# Patient Record
Sex: Male | Born: 1950
Health system: Southern US, Community
[De-identification: ages and names within clinical notes are randomized; demographics above are authoritative.]

## PROBLEM LIST (undated history)

## (undated) DIAGNOSIS — H401134 Primary open-angle glaucoma, bilateral, indeterminate stage: Secondary | ICD-10-CM

## (undated) DIAGNOSIS — I69991 Dysphagia following unspecified cerebrovascular disease: Secondary | ICD-10-CM

## (undated) DIAGNOSIS — K4090 Unilateral inguinal hernia, without obstruction or gangrene, not specified as recurrent: Secondary | ICD-10-CM

## (undated) DIAGNOSIS — R001 Bradycardia, unspecified: Secondary | ICD-10-CM

## (undated) DIAGNOSIS — N189 Chronic kidney disease, unspecified: Secondary | ICD-10-CM

## (undated) DIAGNOSIS — Z9181 History of falling: Secondary | ICD-10-CM

## (undated) DIAGNOSIS — N2889 Other specified disorders of kidney and ureter: Secondary | ICD-10-CM

## (undated) DIAGNOSIS — N183 Chronic kidney disease, stage 3 (moderate): Secondary | ICD-10-CM

## (undated) DIAGNOSIS — J45909 Unspecified asthma, uncomplicated: Secondary | ICD-10-CM

## (undated) DIAGNOSIS — Z950 Presence of cardiac pacemaker: Secondary | ICD-10-CM

## (undated) DIAGNOSIS — I6930 Unspecified sequelae of cerebral infarction: Secondary | ICD-10-CM

## (undated) DIAGNOSIS — E7800 Pure hypercholesterolemia, unspecified: Secondary | ICD-10-CM

## (undated) DIAGNOSIS — G709 Myoneural disorder, unspecified: Secondary | ICD-10-CM

## (undated) DIAGNOSIS — K219 Gastro-esophageal reflux disease without esophagitis: Secondary | ICD-10-CM

## (undated) DIAGNOSIS — G1221 Amyotrophic lateral sclerosis: Secondary | ICD-10-CM

## (undated) DIAGNOSIS — I10 Essential (primary) hypertension: Secondary | ICD-10-CM

## (undated) DIAGNOSIS — H2513 Age-related nuclear cataract, bilateral: Secondary | ICD-10-CM

## (undated) DIAGNOSIS — I639 Cerebral infarction, unspecified: Secondary | ICD-10-CM

## (undated) DIAGNOSIS — D1803 Hemangioma of intra-abdominal structures: Secondary | ICD-10-CM

## (undated) DIAGNOSIS — R748 Abnormal levels of other serum enzymes: Secondary | ICD-10-CM

## (undated) DIAGNOSIS — F17200 Nicotine dependence, unspecified, uncomplicated: Secondary | ICD-10-CM

## (undated) DIAGNOSIS — R569 Unspecified convulsions: Secondary | ICD-10-CM

## (undated) DIAGNOSIS — H409 Unspecified glaucoma: Secondary | ICD-10-CM

## (undated) DIAGNOSIS — G6181 Chronic inflammatory demyelinating polyneuritis: Secondary | ICD-10-CM

## (undated) DIAGNOSIS — J189 Pneumonia, unspecified organism: Secondary | ICD-10-CM

## (undated) DIAGNOSIS — H35033 Hypertensive retinopathy, bilateral: Secondary | ICD-10-CM

## (undated) DIAGNOSIS — M1990 Unspecified osteoarthritis, unspecified site: Secondary | ICD-10-CM

## (undated) HISTORY — DX: Primary open-angle glaucoma, bilateral, indeterminate stage: H40.1134

## (undated) HISTORY — DX: Unspecified sequelae of cerebral infarction: I69.30

## (undated) HISTORY — DX: Hemangioma of intra-abdominal structures: D18.03

## (undated) HISTORY — DX: Bradycardia, unspecified: R00.1

## (undated) HISTORY — DX: Chronic inflammatory demyelinating polyneuritis: G61.81

## (undated) HISTORY — DX: History of falling: Z91.81

## (undated) HISTORY — DX: Presence of cardiac pacemaker: Z95.0

## (undated) HISTORY — PX: HERNIA REPAIR: SHX51

## (undated) HISTORY — DX: Unilateral inguinal hernia, without obstruction or gangrene, not specified as recurrent: K40.90

## (undated) HISTORY — DX: Unspecified convulsions: R56.9

## (undated) HISTORY — PX: SHOULDER SURGERY: SHX246

## (undated) HISTORY — DX: Hypertensive retinopathy, bilateral: H35.033

## (undated) HISTORY — DX: Pure hypercholesterolemia, unspecified: E78.00

## (undated) HISTORY — DX: Age-related nuclear cataract, bilateral: H25.13

## (undated) HISTORY — DX: Abnormal levels of other serum enzymes: R74.8

## (undated) HISTORY — PX: JOINT REPLACEMENT: SHX530

## (undated) HISTORY — DX: Other specified disorders of kidney and ureter: N28.89

## (undated) HISTORY — DX: Chronic kidney disease, stage 3 (moderate): N18.3

## (undated) HISTORY — PX: EYE SURGERY: SHX253

---

## 1998-10-18 ENCOUNTER — Ambulatory Visit (HOSPITAL_BASED_OUTPATIENT_CLINIC_OR_DEPARTMENT_OTHER): Admission: RE | Admit: 1998-10-18 | Discharge: 1998-10-18 | Payer: Self-pay | Admitting: Orthopedic Surgery

## 2000-07-22 ENCOUNTER — Emergency Department (HOSPITAL_COMMUNITY): Admission: EM | Admit: 2000-07-22 | Discharge: 2000-07-22 | Payer: Self-pay | Admitting: Emergency Medicine

## 2000-07-31 ENCOUNTER — Emergency Department (HOSPITAL_COMMUNITY): Admission: EM | Admit: 2000-07-31 | Discharge: 2000-07-31 | Payer: Self-pay | Admitting: Emergency Medicine

## 2000-09-09 ENCOUNTER — Inpatient Hospital Stay (HOSPITAL_COMMUNITY): Admission: EM | Admit: 2000-09-09 | Discharge: 2000-09-10 | Payer: Self-pay | Admitting: Emergency Medicine

## 2003-11-15 ENCOUNTER — Emergency Department (HOSPITAL_COMMUNITY): Admission: EM | Admit: 2003-11-15 | Discharge: 2003-11-16 | Payer: Self-pay | Admitting: Emergency Medicine

## 2004-02-15 ENCOUNTER — Emergency Department (HOSPITAL_COMMUNITY): Admission: EM | Admit: 2004-02-15 | Discharge: 2004-02-15 | Payer: Self-pay | Admitting: Emergency Medicine

## 2005-09-15 ENCOUNTER — Emergency Department (HOSPITAL_COMMUNITY): Admission: EM | Admit: 2005-09-15 | Discharge: 2005-09-15 | Payer: Self-pay | Admitting: Emergency Medicine

## 2009-06-04 ENCOUNTER — Emergency Department (HOSPITAL_COMMUNITY): Admission: EM | Admit: 2009-06-04 | Discharge: 2009-06-05 | Payer: Self-pay

## 2009-06-10 ENCOUNTER — Emergency Department (HOSPITAL_COMMUNITY): Admission: EM | Admit: 2009-06-10 | Discharge: 2009-06-10 | Payer: Self-pay | Admitting: Emergency Medicine

## 2009-06-17 ENCOUNTER — Emergency Department (HOSPITAL_COMMUNITY): Admission: EM | Admit: 2009-06-17 | Discharge: 2009-06-17 | Payer: Self-pay | Admitting: Emergency Medicine

## 2010-01-22 ENCOUNTER — Inpatient Hospital Stay (HOSPITAL_COMMUNITY): Admission: AC | Admit: 2010-01-22 | Discharge: 2010-01-30 | Payer: Self-pay

## 2010-01-24 ENCOUNTER — Ambulatory Visit: Payer: Self-pay | Admitting: Physical Medicine & Rehabilitation

## 2010-02-12 ENCOUNTER — Emergency Department (HOSPITAL_COMMUNITY): Admission: EM | Admit: 2010-02-12 | Discharge: 2010-02-12 | Payer: Self-pay | Admitting: Emergency Medicine

## 2010-12-13 LAB — COMPREHENSIVE METABOLIC PANEL
AST: 24 U/L (ref 0–37)
Albumin: 4 g/dL (ref 3.5–5.2)
BUN: 13 mg/dL (ref 6–23)
CO2: 25 mEq/L (ref 19–32)
Calcium: 9.6 mg/dL (ref 8.4–10.5)
Chloride: 102 mEq/L (ref 96–112)
GFR calc non Af Amer: >60 mL/min (ref >60)
Glucose, Bld: 99 mg/dL (ref 70–99)
Total Bilirubin: 0.6 mg/dL (ref 0.3–1.2)

## 2010-12-13 LAB — BASIC METABOLIC PANEL
BUN: 13 mg/dL (ref 6–23)
CO2: 21 mEq/L (ref 19–32)
CO2: 25 mEq/L (ref 19–32)
Calcium: 9.2 mg/dL (ref 8.4–10.5)
Calcium: 9.5 mg/dL (ref 8.4–10.5)
Chloride: 104 mEq/L (ref 96–112)
Creatinine, Ser: 1.02 mg/dL (ref 0.4–1.5)
GFR calc Af Amer: >60 mL/min (ref >60)
Glucose, Bld: 74 mg/dL (ref 70–99)
Potassium: 3.6 mEq/L (ref 3.5–5.1)
Potassium: 3.8 mEq/L (ref 3.5–5.1)
Sodium: 136 mEq/L (ref 135–145)
Sodium: 136 mEq/L (ref 135–145)

## 2010-12-13 LAB — APTT: aPTT: 26 seconds (ref 24–37)

## 2010-12-13 LAB — CBC
HCT: 50.2 % (ref 39.0–52.0)
MCV: 101.6 fL — ABNORMAL HIGH (ref 78.0–100.0)
Platelets: 260 10*3/uL (ref 150–400)
Platelets: 246 10*3/uL (ref 150–400)
RBC: 4.95 MIL/uL (ref 4.22–5.81)
RDW: 16.5 % — ABNORMAL HIGH (ref 11.5–15.5)
WBC: 11.9 10*3/uL — ABNORMAL HIGH (ref 4.0–10.5)
WBC: 8.9 10*3/uL (ref 4.0–10.5)

## 2010-12-13 LAB — RAPID URINE DRUG SCREEN, HOSP PERFORMED
Amphetamines: NOT DETECTED
Cocaine: NOT DETECTED

## 2010-12-13 LAB — ETHANOL: Alcohol, Ethyl (B): 206 mg/dL — ABNORMAL HIGH (ref 0–10)

## 2010-12-13 LAB — POCT I-STAT, CHEM 8
Calcium, Ion: 1.3 mmol/L (ref 1.12–1.32)
Glucose, Bld: 96 mg/dL (ref 70–99)
HCT: 51 % (ref 39.0–52.0)
Hemoglobin: 17.3 g/dL — ABNORMAL HIGH (ref 13.0–17.0)

## 2010-12-13 LAB — URINALYSIS, ROUTINE W REFLEX MICROSCOPIC
Glucose, UA: NEGATIVE mg/dL
Hgb urine dipstick: NEGATIVE
Ketones, ur: NEGATIVE mg/dL
Leukocytes, UA: NEGATIVE
Specific Gravity, Urine: 1.012 (ref 1.005–1.030)

## 2010-12-13 LAB — CULTURE, BLOOD (ROUTINE X 2)
Culture: NO GROWTH
Culture: NO GROWTH

## 2010-12-13 LAB — PROTIME-INR: INR: 0.96 (ref 0.00–1.49)

## 2010-12-30 LAB — POCT I-STAT, CHEM 8
Creatinine, Ser: 1.3 mg/dL (ref 0.4–1.5)
HCT: 48 % (ref 39.0–52.0)
Hemoglobin: 16.3 g/dL (ref 13.0–17.0)
TCO2: 21 mmol/L (ref 0–100)

## 2012-02-25 ENCOUNTER — Emergency Department (HOSPITAL_COMMUNITY)
Admission: EM | Admit: 2012-02-25 | Discharge: 2012-02-25 | Disposition: A | Discharge status: Home / Self Care | Payer: Self-pay | Attending: Emergency Medicine | Admitting: Emergency Medicine

## 2012-02-25 ENCOUNTER — Encounter (HOSPITAL_COMMUNITY): Payer: Self-pay | Admitting: Emergency Medicine

## 2012-02-25 ENCOUNTER — Emergency Department (HOSPITAL_COMMUNITY): Payer: Self-pay

## 2012-02-25 DIAGNOSIS — S40019A Contusion of unspecified shoulder, initial encounter: Secondary | ICD-10-CM | POA: Insufficient documentation

## 2012-02-25 DIAGNOSIS — S0100XA Unspecified open wound of scalp, initial encounter: Secondary | ICD-10-CM | POA: Insufficient documentation

## 2012-02-25 DIAGNOSIS — S0101XA Laceration without foreign body of scalp, initial encounter: Secondary | ICD-10-CM

## 2012-02-25 DIAGNOSIS — W19XXXA Unspecified fall, initial encounter: Secondary | ICD-10-CM

## 2012-02-25 DIAGNOSIS — G319 Degenerative disease of nervous system, unspecified: Secondary | ICD-10-CM | POA: Insufficient documentation

## 2012-02-25 DIAGNOSIS — I10 Essential (primary) hypertension: Secondary | ICD-10-CM | POA: Insufficient documentation

## 2012-02-25 DIAGNOSIS — S40012A Contusion of left shoulder, initial encounter: Secondary | ICD-10-CM

## 2012-02-25 DIAGNOSIS — W1809XA Striking against other object with subsequent fall, initial encounter: Secondary | ICD-10-CM | POA: Insufficient documentation

## 2012-02-25 HISTORY — DX: Essential (primary) hypertension: I10

## 2012-02-25 MED ORDER — LIDOCAINE-EPINEPHRINE (PF) 2 %-1:200000 IJ SOLN: 10.0000 mL | Freq: Once | INTRAMUSCULAR | Status: DC

## 2012-02-25 MED ORDER — METOPROLOL SUCCINATE ER 50 MG PO TB24: 50.0000 mg | ORAL_TABLET | Freq: Every day | ORAL | Status: DC

## 2012-02-25 MED ORDER — CLONIDINE HCL 0.1 MG PO TABS
0.1000 mg | ORAL_TABLET | Freq: Once | ORAL | Status: AC
  Administered 2012-02-25: 0.1 mg via ORAL
  Filled 2012-02-25: qty 1

## 2012-02-25 MED ORDER — AMLODIPINE BESYLATE 10 MG PO TABS: 10.0000 mg | ORAL_TABLET | Freq: Every day | ORAL | Status: DC

## 2012-02-25 NOTE — Discharge Instructions (Signed)
Used generic triple antibiotic ointment on the laceration. The staples need to be removed in one week. Return to the emergency department for any problems listed on the head injury sheet. Ice packs to the injured areas. You  can have ibuprofen or tylenol  for pain as needed. Start the Norvasc for your hypertension. You need to see if Dr. Loma Sousa will see you again and be your doctor to manage your hypertension.

## 2012-02-25 NOTE — ED Notes (Signed)
Pt reports history of HTN without taking his blood pressure medicines for 2 years, history of previous stroke. Fell tonight by loosing balance as getting out of car and struck head on concrete. Pt reports that he drank a fifth of etoh tonight.

## 2012-02-25 NOTE — ED Notes (Signed)
Pt states he was getting out of car when he lost his balance and fell. Pt hit back of head and ground resulting in small laceration. Pt also has small abrasion on left shoulder.

## 2012-02-25 NOTE — ED Provider Notes (Cosign Needed)
History     CSN: [id number]  Arrival date & time 02/25/12  0100   First MD Initiated Contact with Patient 02/25/12 [phone]      Chief Complaint  Patient presents with  . Fall    head injury    (Consider location/radiation/quality/duration/timing/severity/associated sxs/prior treatment) HPI  Patient relates tonight he was getting out of the car and lost his balance and fell back hitting the back of his head on a small brick retaining her wall. His wife denies loss of consciousness. Patient indicates he has an abrasion on his left posterior shoulder. He states he has minor discomfort there and in the back of his head.  PCP Dr. Irven Shelling   Past Medical History  Diagnosis Date  . Hypertension     History reviewed. No pertinent past surgical history.  History reviewed. No pertinent family history.  History  Substance Use Topics  . Smoking status:  yes   . Smokeless tobacco: Not on file  . Alcohol Use:  yes, including tonight   lives with spouse   states tetanus is up to date  Review of Systems  All other systems reviewed and are negative.    Allergies  Atacand hct and Shellfish allergy  Home Medications   Current Outpatient Rx  Name Route Sig Dispense Refill    BP 210/128  Pulse 82  Temp(Src) 98.3 F (36.8 C) (Oral)  Resp 17  SpO2 97%  Vital signs normal except hypertensive   Physical Exam  Nursing note and vitals reviewed. Constitutional: He is oriented to person, place, and time. He appears well-developed and well-nourished. He is cooperative.       Smells of ETOH  HENT:  Head: Normocephalic and atraumatic.    Eyes: Conjunctivae and EOM are normal. Pupils are equal, round, and reactive to light.  Neck: Normal range of motion and full passive range of motion without pain. Neck supple.  Cardiovascular: Normal rate, regular rhythm and normal heart sounds.   Pulmonary/Chest: Effort normal and breath sounds normal. Not tachypneic. No respiratory  distress.  Abdominal: Soft. Bowel sounds are normal.  Musculoskeletal: Normal range of motion. He exhibits edema and tenderness.       Back:       Patient does not have pain in his actual shoulder, he has old scars consistent with rotator cuff surgery bilaterally. He is tender over the abrasion in his left posterior scapular region. There is minimal swelling.  Neurological: He is alert and oriented to person, place, and time. He has normal strength and normal reflexes. No cranial nerve deficit.  Skin: Skin is warm, dry and intact.  Psychiatric: He has a normal mood and affect. His speech is normal and behavior is normal. Thought content normal.    ED Course  Procedures (including critical care time)  LACERATION REPAIR Performed by: Littie Chiem L Authorized by: Janice Norrie Consent: Verbal consent obtained. Risks and benefits: risks, benefits and alternatives were discussed Consent given by: patient Patient identity confirmed: provided demographic data Prepped and Draped in normal sterile fashion Wound explored  Laceration Location: Left posterior scalp Laceration Length: 3 cm  No Foreign Bodies seen or palpated  Anesthesia: local infiltration  Local anesthetic: lidocaine 2%+ 2 epinephrine  Anesthetic total: 6 ml  Irrigation method: syringe Amount of cleaning: standard  Skin closure: Staples   Number of sutures: 4  Patient tolerance: Patient tolerated the procedure well with no immediate complications.  Patient has mild swelling over the superior portion of his left scapula,  wife wants that to be x-rayed despite being reassured that would not change his treatment plan in any way.   Labs Reviewed - No data to display Dg Scapula Left  02/25/2012  *RADIOLOGY REPORT*  Clinical Data: Left posterior scapular pain after fall.  LEFT SCAPULA - 2+ VIEWS  Comparison: None.  Findings: The scapula and left shoulder appear intact.  No displaced fractures identified.  No glenohumeral  dislocation. Acromioclavicular and coracoclavicular spaces appear intact.  No focal bone lesion or bone destruction.  IMPRESSION: No acute bony abnormalities appreciated.  Original Report Authenticated By: Neale Burly, M.D.   Ct Head Wo Contrast  02/25/2012  *RADIOLOGY REPORT*  Clinical Data: Hypertension.  History of previous stroke.  The patient fell tonight striking head on concrete.  CT HEAD WITHOUT CONTRAST  Technique:  Contiguous axial images were obtained from the base of the skull through the vertex without contrast.  Comparison: None.  Findings: Diffuse cerebral atrophy.  Patchy low attenuation changes in the deep white matter consistent with small vessel ischemia. Scattered calcifications, likely dystrophic.  No mass effect or midline shift.  No abnormal extra-axial fluid collections.  Pearline Cables- white matter junctions are distinct.  Old lacunar infarcts in the thalami and left periventricular white matter.  Basal cisterns are not effaced.  No evidence of acute intracranial hemorrhage. No depressed skull fractures.  Mucosal membrane thickening in the maxillary antra bilaterally with diffuse opacification of the ethmoid air cells.  No displaced fractures are identified in this likely represents a preexisting inflammatory change.  Mastoid air cells are not opacified.  IMPRESSION: No acute intracranial abnormalities.  Presumed inflammatory changes in the paranasal sinuses.  Original Report Authenticated By: Neale Burly, M.D.    Date: 02/25/2012  Rate: 83  Rhythm: normal sinus rhythm  QRS Axis: normal  Intervals: normal  ST/T Wave abnormalities: strain pattern  Conduction Disutrbances:LVH  Narrative Interpretation:   Old EKG Reviewed: none available    1. Fall   2. Scalp laceration   3. Contusion of scapula, left   4. Hypertension    New Prescriptions   AMLODIPINE (NORVASC) 10 MG TABLET    Take 1 tablet (10 mg total) by mouth daily.    Plan discharge  Rolland Porter, MD,  [place], MD 02/25/12 [place], MD 02/25/12 [phone]

## 2012-03-04 ENCOUNTER — Encounter (HOSPITAL_COMMUNITY): Payer: Self-pay | Admitting: *Deleted

## 2012-03-04 ENCOUNTER — Emergency Department (HOSPITAL_COMMUNITY)
Admission: EM | Admit: 2012-03-04 | Discharge: 2012-03-04 | Disposition: A | Discharge status: Home / Self Care | Payer: Medicare Other | Attending: Emergency Medicine | Admitting: Emergency Medicine

## 2012-03-04 DIAGNOSIS — I10 Essential (primary) hypertension: Secondary | ICD-10-CM | POA: Insufficient documentation

## 2012-03-04 DIAGNOSIS — Z4802 Encounter for removal of sutures: Secondary | ICD-10-CM | POA: Insufficient documentation

## 2012-03-04 DIAGNOSIS — IMO0002 Reserved for concepts with insufficient information to code with codable children: Secondary | ICD-10-CM

## 2012-03-04 NOTE — ED Provider Notes (Signed)
History    This chart was scribed for Sharyon Cable, MD, MD by Rhae Lerner. The patient was seen in room [facility] and the patient's care was started at 7:40PM.   CSN: [id number]  Arrival date & time 03/04/12  [id number]   First MD Initiated Contact with Patient 03/04/12 1934      Chief Complaint  Patient presents with  . staple removal      The history is provided by the patient.   Darrell [name] is a 61 y.o. male who presents to the Emergency Department due to staple removal from head. Pt reports staples were placed 7 days ago at [facility] ED. Pt denies fever and pain.  Denies drainage. He has no other complaints   Past Medical History  Diagnosis Date  . Hypertension     History reviewed. No pertinent past surgical history.  No family history on file.  History  Substance Use Topics  . Smoking status: Not on file  . Smokeless tobacco: Not on file  . Alcohol Use:       Review of Systems  Constitutional: Negative for fever and chills.  Respiratory: Negative for cough.   Gastrointestinal: Negative for nausea and vomiting.  Neurological: Negative for headaches.    Allergies  Atacand hct and Shellfish allergy  Home Medications   Current Outpatient Rx  Name Route Sig Dispense Refill  . AMLODIPINE BESYLATE 10 MG PO TABS Oral Take 1 tablet (10 mg total) by mouth daily. 30 tablet 0  . METOPROLOL SUCCINATE ER 50 MG PO TB24 Oral Take 1 tablet (50 mg total) by mouth daily. 30 tablet 0    BP 221/142  Pulse 70  Temp(Src) 98.2 F (36.8 C) (Oral)  Resp 18  SpO2 98%  Physical Exam  Nursing note and vitals reviewed. CONSTITUTIONAL: Well developed/well nourished HEAD AND FACE: Normocephalic/atraumatic EYES: EOMI ENMT: Mucous membranes moist NECK: supple no meningeal signs CV: S1/S2 noted, no murmurs/rubs/gallops noted LUNGS: Lungs are clear to auscultation bilaterally, no apparent distress ABDOMEN: soft, nontender, no rebound or guarding NEURO: Pt is awake/alert, moves all  extremitiesx4 EXTREMITIES: pulses normal, full ROM SKIN: warm, color normal, 4 staples noted on posterior scalp, well healed, no drainage/erythema noted PSYCH: no abnormalities of mood noted   ED Course  SUTURE REMOVAL Performed by: Sharyon Cable Authorized by: Sharyon Cable Consent: Verbal consent obtained. Body area: head/neck Location details: scalp Staples Removed: 4 Post-removal: Steri-Strips applied Patient tolerance: Patient tolerated the procedure well with no immediate complications.    DIAGNOSTIC STUDIES: Oxygen Saturation is 98% on room air, normal by my interpretation.    COORDINATION OF CARE: 7:43PM EDP discusses pt ED treatment with pt.     MDM  Nursing notes including past medical history, social history and family history reviewed and considered in documentation   I personally performed the services described in this documentation, which was scribed in my presence. The recorded information has been reviewed and considered.          Sharyon Cable, MD 03/04/12 [phone]

## 2012-03-04 NOTE — Discharge Instructions (Signed)
Laceration Care, Adult °A laceration is a cut that goes through all layers of the skin. The cut goes into the tissue beneath the skin. °HOME CARE °For stitches (sutures) or staples: °· Keep the cut clean and dry.  °· If you have a bandage (dressing), change it at least once a day. Change the bandage if it gets wet or dirty, or as told by your doctor.  °· Wash the cut with soap and water 2 times a day. Rinse the cut with water. Pat it dry with a clean towel.  °· Put a thin layer of medicated cream on the cut as told by your doctor.  °· You may shower after the first 24 hours. Do not soak the cut in water until the stitches are removed.  °· Only take medicines as told by your doctor.  °· Have your stitches or staples removed as told by your doctor.  °For skin adhesive strips: °· Keep the cut clean and dry.  °· Do not get the strips wet. You may take a bath, but be careful to keep the cut dry.  °· If the cut gets wet, pat it dry with a clean towel.  °· The strips will fall off on their own. Do not remove the strips that are still stuck to the cut.  °For wound glue: °· You may shower or take baths. Do not soak or scrub the cut. Do not swim. Avoid heavy sweating until the glue falls off on its own. After a shower or bath, pat the cut dry with a clean towel.  °· Do not put medicine on your cut until the glue falls off.  °· If you have a bandage, do not put tape over the glue.  °· Avoid lots of sunlight or tanning lamps until the glue falls off. Put sunscreen on the cut for the first year to reduce your scar.  °· The glue will fall off on its own. Do not pick at the glue.  °You may need a tetanus shot if: °· You cannot remember when you had your last tetanus shot.  °· You have never had a tetanus shot.  °If you need a tetanus shot and you choose not to have one, you may get tetanus. Sickness from tetanus can be serious. °GET HELP RIGHT AWAY IF:  °· Your pain does not get better with medicine.  °· Your arm, hand, leg, or  foot loses feeling (numbness) or changes color.  °· Your cut is bleeding.  °· Your joint feels weak, or you cannot use your joint.  °· You have painful lumps on your body.  °· Your cut is red, puffy (swollen), or painful.  °· You have a red line on the skin near the cut.  °· You have yellowish-white fluid (pus) coming from the cut.  °· You have a fever.  °· You have a bad smell coming from the cut or bandage.  °· Your cut breaks open before or after stitches are removed.  °· You notice something coming out of the cut, such as wood or glass.  °· You cannot move a finger or toe.  °MAKE SURE YOU:  °· Understand these instructions.  °· Will watch your condition.  °· Will get help right away if you are not doing well or get worse.  °Document Released: 02/28/2008 Document Revised: 08/31/2011 Document Reviewed: 03/07/2011 °[employer]® Patient Information ©2012 [employer]. °

## 2012-03-04 NOTE — ED Notes (Signed)
The pt has staples in his head that were placed 7 days ago at [facility] ed.  Well-healed

## 2012-10-09 ENCOUNTER — Observation Stay (HOSPITAL_COMMUNITY): Payer: Medicare Other

## 2012-10-09 ENCOUNTER — Observation Stay (HOSPITAL_COMMUNITY)
Admission: EM | Admit: 2012-10-09 | Discharge: 2012-10-11 | Disposition: A | Discharge status: Home / Self Care | Payer: Medicare Other | Attending: Internal Medicine | Admitting: Internal Medicine

## 2012-10-09 ENCOUNTER — Emergency Department (HOSPITAL_COMMUNITY): Payer: Medicare Other

## 2012-10-09 ENCOUNTER — Encounter (HOSPITAL_COMMUNITY): Payer: Self-pay | Admitting: Family Medicine

## 2012-10-09 DIAGNOSIS — F17200 Nicotine dependence, unspecified, uncomplicated: Secondary | ICD-10-CM | POA: Insufficient documentation

## 2012-10-09 DIAGNOSIS — R55 Syncope and collapse: Principal | ICD-10-CM

## 2012-10-09 DIAGNOSIS — R269 Unspecified abnormalities of gait and mobility: Secondary | ICD-10-CM | POA: Insufficient documentation

## 2012-10-09 DIAGNOSIS — I10 Essential (primary) hypertension: Secondary | ICD-10-CM

## 2012-10-09 DIAGNOSIS — F1010 Alcohol abuse, uncomplicated: Secondary | ICD-10-CM

## 2012-10-09 DIAGNOSIS — F10239 Alcohol dependence with withdrawal, unspecified: Secondary | ICD-10-CM | POA: Insufficient documentation

## 2012-10-09 DIAGNOSIS — R21 Rash and other nonspecific skin eruption: Secondary | ICD-10-CM

## 2012-10-09 DIAGNOSIS — Z720 Tobacco use: Secondary | ICD-10-CM | POA: Diagnosis present

## 2012-10-09 DIAGNOSIS — R61 Generalized hyperhidrosis: Secondary | ICD-10-CM | POA: Insufficient documentation

## 2012-10-09 DIAGNOSIS — F10939 Alcohol use, unspecified with withdrawal, unspecified: Secondary | ICD-10-CM | POA: Insufficient documentation

## 2012-10-09 DIAGNOSIS — N189 Chronic kidney disease, unspecified: Secondary | ICD-10-CM | POA: Insufficient documentation

## 2012-10-09 DIAGNOSIS — I129 Hypertensive chronic kidney disease with stage 1 through stage 4 chronic kidney disease, or unspecified chronic kidney disease: Secondary | ICD-10-CM | POA: Insufficient documentation

## 2012-10-09 DIAGNOSIS — N179 Acute kidney failure, unspecified: Secondary | ICD-10-CM

## 2012-10-09 DIAGNOSIS — I69998 Other sequelae following unspecified cerebrovascular disease: Secondary | ICD-10-CM | POA: Insufficient documentation

## 2012-10-09 DIAGNOSIS — E860 Dehydration: Secondary | ICD-10-CM | POA: Insufficient documentation

## 2012-10-09 HISTORY — DX: Cerebral infarction, unspecified: I63.9

## 2012-10-09 LAB — CBC WITH DIFFERENTIAL/PLATELET
Basophils Absolute: 0 10*3/uL (ref 0.0–0.1)
Basophils Absolute: 0 10*3/uL (ref 0.0–0.1)
Eosinophils Absolute: 0 10*3/uL (ref 0.0–0.7)
Eosinophils Relative: 0 % (ref 0–5)
HCT: 41 % (ref 39.0–52.0)
Lymphocytes Relative: 22 % (ref 12–46)
Lymphs Abs: 2.4 10*3/uL (ref 0.7–4.0)
Lymphs Abs: 1.2 10*3/uL (ref 0.7–4.0)
MCH: 30.6 pg (ref 26.0–34.0)
MCV: 89.7 fL (ref 78.0–100.0)
Monocytes Absolute: 0.8 10*3/uL (ref 0.1–1.0)
Neutrophils Relative %: 66 % (ref 43–77)
Platelets: 204 10*3/uL (ref 150–400)
Platelets: 217 10*3/uL (ref 150–400)
RBC: 4.45 MIL/uL (ref 4.22–5.81)
RDW: 13.4 % (ref 11.5–15.5)
WBC: 10.5 10*3/uL (ref 4.0–10.5)

## 2012-10-09 LAB — COMPREHENSIVE METABOLIC PANEL
ALT: 13 U/L (ref 0–53)
AST: 24 U/L (ref 0–37)
Alkaline Phosphatase: 90 U/L (ref 39–117)
CO2: 22 mEq/L (ref 19–32)
GFR calc Af Amer: 32 mL/min — ABNORMAL LOW (ref >90)
GFR calc non Af Amer: 28 mL/min — ABNORMAL LOW (ref >90)
Glucose, Bld: 110 mg/dL — ABNORMAL HIGH (ref 70–99)
Potassium: 4.6 mEq/L (ref 3.5–5.1)
Sodium: 132 mEq/L — ABNORMAL LOW (ref 135–145)
Total Protein: 6.9 g/dL (ref 6.0–8.3)

## 2012-10-09 LAB — CREATININE, SERUM
Creatinine, Ser: 2.43 mg/dL — ABNORMAL HIGH (ref 0.50–1.35)
GFR calc Af Amer: 32 mL/min — ABNORMAL LOW (ref >90)
GFR calc non Af Amer: 27 mL/min — ABNORMAL LOW (ref >90)

## 2012-10-09 LAB — RPR: RPR Ser Ql: NONREACTIVE

## 2012-10-09 MED ORDER — SODIUM CHLORIDE 0.9 % IV SOLN
INTRAVENOUS | Status: DC
  Administered 2012-10-09: 19:00:00 via INTRAVENOUS

## 2012-10-09 MED ORDER — ACETAMINOPHEN 325 MG PO TABS: 650.0000 mg | ORAL_TABLET | Freq: Four times a day (QID) | ORAL | Status: DC | PRN

## 2012-10-09 MED ORDER — MORPHINE SULFATE 2 MG/ML IJ SOLN: 2.0000 mg | INTRAMUSCULAR | Status: DC | PRN

## 2012-10-09 MED ORDER — LORAZEPAM 2 MG/ML IJ SOLN
1.0000 mg | Freq: Four times a day (QID) | INTRAMUSCULAR | Status: DC | PRN
Start: 2012-10-09 — End: 2012-10-11

## 2012-10-09 MED ORDER — FOLIC ACID 1 MG PO TABS
1.0000 mg | ORAL_TABLET | Freq: Every day | ORAL | Status: DC
  Administered 2012-10-10 – 2012-10-11 (×2): 1 mg via ORAL
  Filled 2012-10-09 (×3): qty 1

## 2012-10-09 MED ORDER — HEPARIN SODIUM (PORCINE) 5000 UNIT/ML IJ SOLN
5000.0000 [IU] | Freq: Three times a day (TID) | INTRAMUSCULAR | Status: DC
  Administered 2012-10-09 – 2012-10-11 (×5): 5000 [IU] via SUBCUTANEOUS
  Filled 2012-10-09 (×8): qty 1

## 2012-10-09 MED ORDER — ONDANSETRON HCL 4 MG/2ML IJ SOLN: 4.0000 mg | Freq: Four times a day (QID) | INTRAMUSCULAR | Status: DC | PRN

## 2012-10-09 MED ORDER — ADULT MULTIVITAMIN W/MINERALS CH
1.0000 | ORAL_TABLET | Freq: Every day | ORAL | Status: DC
  Administered 2012-10-10 – 2012-10-11 (×2): 1 via ORAL
  Filled 2012-10-09 (×3): qty 1

## 2012-10-09 MED ORDER — SODIUM CHLORIDE 0.9 % IJ SOLN: 3.0000 mL | INTRAMUSCULAR | Status: DC | PRN

## 2012-10-09 MED ORDER — VITAMIN B-1 100 MG PO TABS
100.0000 mg | ORAL_TABLET | Freq: Every day | ORAL | Status: DC
  Administered 2012-10-10 – 2012-10-11 (×2): 100 mg via ORAL
  Filled 2012-10-09 (×2): qty 1

## 2012-10-09 MED ORDER — SODIUM CHLORIDE 0.9 % IJ SOLN
3.0000 mL | Freq: Two times a day (BID) | INTRAMUSCULAR | Status: DC
  Administered 2012-10-09 – 2012-10-11 (×3): 3 mL via INTRAVENOUS

## 2012-10-09 MED ORDER — ACETAMINOPHEN 650 MG RE SUPP: 650.0000 mg | Freq: Four times a day (QID) | RECTAL | Status: DC | PRN

## 2012-10-09 MED ORDER — ASPIRIN EC 81 MG PO TBEC
81.0000 mg | DELAYED_RELEASE_TABLET | Freq: Every day | ORAL | Status: DC
  Administered 2012-10-09 – 2012-10-11 (×3): 81 mg via ORAL
  Filled 2012-10-09 (×3): qty 1

## 2012-10-09 MED ORDER — NICOTINE 21 MG/24HR TD PT24
21.0000 mg | MEDICATED_PATCH | Freq: Every day | TRANSDERMAL | Status: DC
  Administered 2012-10-10 – 2012-10-11 (×2): 21 mg via TRANSDERMAL
  Filled 2012-10-09 (×3): qty 1

## 2012-10-09 MED ORDER — SODIUM CHLORIDE 0.9 % IV SOLN: 250.0000 mL | INTRAVENOUS | Status: DC | PRN

## 2012-10-09 MED ORDER — OXYCODONE HCL 5 MG PO TABS: 5.0000 mg | ORAL_TABLET | ORAL | Status: DC | PRN

## 2012-10-09 MED ORDER — CLOTRIMAZOLE-BETAMETHASONE 1-0.05 % EX CREA
TOPICAL_CREAM | Freq: Two times a day (BID) | CUTANEOUS | Status: DC
  Administered 2012-10-09 – 2012-10-11 (×3): via TOPICAL
  Filled 2012-10-09: qty 15

## 2012-10-09 MED ORDER — LORAZEPAM 1 MG PO TABS
1.0000 mg | ORAL_TABLET | Freq: Four times a day (QID) | ORAL | Status: DC | PRN
  Administered 2012-10-10: 1 mg via ORAL
  Filled 2012-10-09 (×2): qty 1

## 2012-10-09 MED ORDER — HYDRALAZINE HCL 10 MG PO TABS
10.0000 mg | ORAL_TABLET | Freq: Four times a day (QID) | ORAL | Status: DC
  Administered 2012-10-09 – 2012-10-11 (×6): 10 mg via ORAL
  Filled 2012-10-09 (×10): qty 1

## 2012-10-09 MED ORDER — ONDANSETRON HCL 4 MG PO TABS: 4.0000 mg | ORAL_TABLET | Freq: Four times a day (QID) | ORAL | Status: DC | PRN

## 2012-10-09 MED ORDER — THIAMINE HCL 100 MG/ML IJ SOLN
100.0000 mg | Freq: Every day | INTRAMUSCULAR | Status: DC
  Filled 2012-10-09 (×2): qty 1

## 2012-10-09 NOTE — ED Notes (Signed)
Patient transported to X-ray 

## 2012-10-09 NOTE — ED Notes (Signed)
MD at bedside. 

## 2012-10-09 NOTE — ED Provider Notes (Signed)
History     CSN: [id number]  Arrival date & time 10/09/12  1022   First MD Initiated Contact with Patient 10/09/12 1051      Chief Complaint  Patient presents with  . Weakness    (Consider location/radiation/quality/duration/timing/severity/associated sxs/prior treatment) The history is provided by the patient and medical records.   patient was visiting a family member in the emergency department when he suddenly became unresponsive.  He was diaphoretic and rolling.  No seizure activity noted.  The patient was promptly brought to a resuscitation room and I evaluated the patient the bedside.  On my arrival the patient was not significantly responsive  Past Medical History  Diagnosis Date  . Hypertension     History reviewed. No pertinent past surgical history.  History reviewed. No pertinent family history.  History  Substance Use Topics  . Smoking status: Not on file  . Smokeless tobacco: Not on file  . Alcohol Use:       Review of Systems  All other systems reviewed and are negative.    Allergies  Atacand hct and Shellfish allergy  Home Medications   Current Outpatient Rx  Name  Route  Sig  Dispense  Refill  . HYDRALAZINE HCL 10 MG PO TABS   Oral   Take 10 mg by mouth 4 (four) times daily.         Marland Kitchen HYDROCHLOROTHIAZIDE 25 MG PO TABS   Oral   Take 25 mg by mouth daily.         Marland Kitchen LISINOPRIL 20 MG PO TABS   Oral   Take 20 mg by mouth daily.           BP 134/87  Pulse 104  Resp 18  SpO2 98%  Physical Exam  Nursing note and vitals reviewed. Constitutional: He is oriented to person, place, and time. He appears well-developed and well-nourished.  HENT:  Head: Normocephalic and atraumatic.  Eyes: EOM are normal.  Neck: Normal range of motion.  Cardiovascular: Normal rate, regular rhythm, normal heart sounds and intact distal pulses.   Pulmonary/Chest: Effort normal and breath sounds normal. No respiratory distress.  Abdominal: Soft. He  exhibits no distension. There is no tenderness.  Musculoskeletal: Normal range of motion.  Neurological: He is alert and oriented to person, place, and time.  Skin: Skin is warm and dry.  Psychiatric: He has a normal mood and affect. Judgment normal.    ED Course  Procedures (including critical care time)   Date: 10/09/2012  Rate: 89  Rhythm: normal sinus rhythm  QRS Axis: normal  Intervals: normal  ST/T Wave abnormalities: Lateral ST changes which are nonspecific unchanged from prior  Conduction Disutrbances: none  Narrative Interpretation:   Old EKG Reviewed: No significant changes noted     Labs Reviewed  CBC WITH DIFFERENTIAL - Abnormal; Notable for the following:    Monocytes Absolute 1.2 (*)     All other components within normal limits  COMPREHENSIVE METABOLIC PANEL - Abnormal; Notable for the following:    Sodium 132 (*)     Glucose, Bld 110 (*)     BUN 31 (*)     Creatinine, Ser 2.39 (*)     Total Bilirubin 0.2 (*)     GFR calc non Af Amer 28 (*)     GFR calc Af Amer 32 (*)     All other components within normal limits  POCT I-STAT TROPONIN I  URINALYSIS, ROUTINE W REFLEX MICROSCOPIC   Dg Chest  2 View  10/09/2012  *RADIOLOGY REPORT*  Clinical Data: Weakness, syncope  CHEST - 2 VIEW  Comparison: None  Findings: Cardiomediastinal silhouette is unremarkable.  Mild hyperinflation.  Mild degenerative changes thoracic spine.  No acute infiltrate or pulmonary edema.  IMPRESSION: Mild hyperinflation.  No active disease.   Original Report Authenticated By: Lahoma Crocker, M.D.    I personally reviewed the imaging tests through PACS system I reviewed available ER/hospitalization records through the EMR   No diagnosis found.    MDM  Syncope without prodrome.  Considerations include arrhythmia.  Labs EKG are normal in the emergency department.  His mental status improved quickly with lying flat.  Doubt dehydration.  He'll be admitted to the hospital overnight for  observation and placed on cardiac monitor.        Hoy Morn, MD 10/09/12 [phone]

## 2012-10-09 NOTE — ED Notes (Signed)
CBG 111 

## 2012-10-09 NOTE — ED Notes (Signed)
Pt now alert, skin is drier, doesn't remember incident. Conversing wnl.

## 2012-10-09 NOTE — Consult Note (Signed)
Reason for Consult:Abnormal MRI of the brain Referring Physician: [place]  CC: Syncope  HPI: Darrell [name] is an 62 y.o. male who was at the hospital today visiting his wife.  Was noted to have an acute onset of loss of consciousness.  Patient was in a wheelchair at the time and therefore did not fall to the ground.  He reports that he did not have any symptoms prior to the syncopal event.  He also reports that he did not feel any different when he came to.  Patient was admitted for further follow up. Patient reports that at baseline he ambulates with a cane due to imbalance from a previous stroke.  He does not report any focal weakness or numbness.  From review of the chart he has had a previous admission with left sided weakness as well.    Past Medical History  Diagnosis Date  . Hypertension   . Stroke    Past surgical history: non-contributory  Family history: Patient's mother is living at 54 years of age, with HTN, s/p recent CVA.  Father is deceased, but health history os unknown   Social History:  reports that he has been smoking.  He does not have any smokeless tobacco history on file. He reports that he drinks alcohol. His drug history not on file.  Allergies  Allergen Reactions  . Atacand Hct (Candesartan Cilexetil-Hctz) Hives  . Shellfish Allergy Hives    Medications:  I have reviewed the patient's current medications. Prior to Admission:  Prescriptions prior to admission  Medication Sig Dispense Refill  . hydrALAZINE (APRESOLINE) 10 MG tablet Take 10 mg by mouth 4 (four) times daily.      . hydrochlorothiazide (HYDRODIURIL) 25 MG tablet Take 25 mg by mouth daily.      Marland Kitchen lisinopril (PRINIVIL,ZESTRIL) 20 MG tablet Take 20 mg by mouth daily.       Scheduled:   . aspirin EC  81 mg Oral Daily  . clotrimazole-betamethasone   Topical BID  . folic acid  1 mg Oral Daily  . heparin  5,000 Units Subcutaneous Q8H  . hydrALAZINE  10 mg Oral QID  . multivitamin with minerals   1 tablet Oral Daily  . nicotine  21 mg Transdermal Daily  . sodium chloride  3 mL Intravenous Q12H  . thiamine  100 mg Oral Daily   Or  . thiamine  100 mg Intravenous Daily    ROS: History obtained from the patient  General ROS: negative for - chills, fatigue, fever, night sweats, weight gain or weight loss Psychological ROS: negative for - behavioral disorder, hallucinations, memory difficulties, mood swings or suicidal ideation Ophthalmic ROS: negative for - blurry vision, double vision, eye pain or loss of vision ENT ROS: negative for - epistaxis, nasal discharge, oral lesions, sore throat, tinnitus or vertigo Allergy and Immunology ROS: negative for - hives or itchy/watery eyes Hematological and Lymphatic ROS: negative for - bleeding problems, bruising or swollen lymph nodes Endocrine ROS: negative for - galactorrhea, hair pattern changes, polydipsia/polyuria or temperature intolerance Respiratory ROS: negative for - cough, hemoptysis, shortness of breath or wheezing Cardiovascular ROS: negative for - chest pain, dyspnea on exertion, edema or irregular heartbeat Gastrointestinal ROS: negative for - abdominal pain, diarrhea, hematemesis, nausea/vomiting or stool incontinence Genito-Urinary ROS: negative for - dysuria, hematuria, incontinence or urinary frequency/urgency Musculoskeletal ROS: negative for - joint swelling or muscular weakness Neurological ROS: as noted in HPI Dermatological ROS: negative for rash and skin lesion changes  Physical  Examination: Blood pressure 127/82, pulse 101, temperature 99.7 F (37.6 C), temperature source Oral, resp. rate 20, SpO2 94.00%.  Neurologic Examination Mental Status: Alert, oriented, thought content appropriate.  Speech fluent without evidence of aphasia.  Able to follow 3 step commands without difficulty. Cranial Nerves: II: Discs flat bilaterally; Visual fields grossly normal, pupils equal, round, reactive to light and  accommodation III,IV, VI: ptosis not present, extra-ocular motions intact bilaterally V,VII: left facial droop, facial light touch sensation normal bilaterally VIII: hearing normal bilaterally IX,X: gag reflex present XI: bilateral shoulder shrug XII: midline tongue extension Motor: Right : Upper extremity   5/5    Left:     Upper extremity   4/5  Lower extremity   5/5     Lower extremity   5/5 Tone and bulk:normal tone throughout; no atrophy noted Sensory: Pinprick and light touch intact throughout, bilaterally Deep Tendon Reflexes: 2+ and symmetric in the upper extremities, 1+ left AJ, 4+ right AJ with sustained clonus Plantars: Right: mute   Left: mute Cerebellar: Mild dysmetria with finger to nose on the left.  Heel to shin intact bilaterally.   Gait: Unable to test CV: pulses palpable throughout     Laboratory Studies:   Basic Metabolic Panel:  Lab [id number] 1503 10/09/12 1050  NA -- 132*  K -- 4.6  CL -- 98  CO2 -- 22  GLUCOSE -- 110*  BUN -- 31*  CREATININE 2.43* 2.39*  CALCIUM -- 10.3  MG -- --  PHOS -- --    Liver Function Tests:  Lab 10/09/12 1050  AST 24  ALT 13  ALKPHOS 90  BILITOT 0.2*  PROT 6.9  ALBUMIN 3.5   No results found for this basename: LIPASE:5,AMYLASE:5 in the last 168 hours No results found for this basename: AMMONIA:3 in the last 168 hours  CBC:  Lab 10/09/12 1503 10/09/12 1050  WBC 8.7 10.5  NEUTROABS 6.6 6.9  HGB 14.0 13.7  HCT 41.0 40.2  MCV 89.7 90.3  PLT 217 204    Cardiac Enzymes:  Lab 10/09/12 2040 10/09/12 1503  CKTOTAL -- --  CKMB -- --  CKMBINDEX -- --  TROPONINI <0.30 <0.30    BNP: No components found with this basename: POCBNP:5  CBG: No results found for this basename: GLUCAP:5 in the last 168 hours  Microbiology: Results for orders placed during the hospital encounter of 01/22/10  CULTURE, BLOOD (ROUTINE X 2)     Status: Normal   Collection Time   01/22/10  4:45 PM      Component Value Range Status  Comment   Specimen Description BLOOD RIGHT ARM   Final    Special Requests BOTTLES DRAWN AEROBIC AND ANAEROBIC 10CC   Final    Culture NO GROWTH 5 DAYS   Final    Report Status 01/29/2010 FINAL   Final   CULTURE, BLOOD (ROUTINE X 2)     Status: Normal   Collection Time   01/22/10  5:38 PM      Component Value Range Status Comment   Specimen Description BLOOD RIGHT ARM   Final    Special Requests     Final    Value: BOTTLES DRAWN AEROBIC AND ANAEROBIC 10CC BLUE, 5CC RED   Culture NO GROWTH 5 DAYS   Final    Report Status 01/29/2010 FINAL   Final   MRSA PCR SCREENING     Status: Normal   Collection Time   01/22/10  8:37 PM  Component Value Range Status Comment   MRSA by PCR    NEGATIVE Final    Value: NEGATIVE            The GeneXpert MRSA Assay (FDA     approved for NASAL specimens     only), is one component of a     comprehensive MRSA colonization     surveillance program. It is not     intended to diagnose MRSA     infection nor to guide or     monitor treatment for     MRSA infections.    Coagulation Studies: No results found for this basename: LABPROT:5,INR:5 in the last 72 hours  Urinalysis: No results found for this basename: COLORURINE:2,APPERANCEUR:2,LABSPEC:2,PHURINE:2,GLUCOSEU:2,HGBUR:2,BILIRUBINUR:2,KETONESUR:2,PROTEINUR:2,UROBILINOGEN:2,NITRITE:2,LEUKOCYTESUR:2 in the last 168 hours  Lipid Panel:  No results found for this basename: chol, trig, hdl, cholhdl, vldl, ldlcalc    HgbA1C:  No results found for this basename: HGBA1C    Urine Drug Screen:     Component Value Date/Time   LABOPIA NONE DETECTED 01/22/2010 1712   COCAINSCRNUR NONE DETECTED 01/22/2010 1712   LABBENZ NONE DETECTED 01/22/2010 1712   AMPHETMU NONE DETECTED 01/22/2010 1712   THCU NONE DETECTED 01/22/2010 1712   LABBARB  Value: NONE DETECTED        DRUG SCREEN FOR MEDICAL PURPOSES ONLY.  IF CONFIRMATION IS NEEDED FOR ANY PURPOSE, NOTIFY LAB WITHIN 5 DAYS.        LOWEST DETECTABLE LIMITS FOR  URINE DRUG SCREEN Drug Class       Cutoff (ng/mL) Amphetamine      1000 Barbiturate      200 Benzodiazepine   [id number] Tricyclics       [id number] Opiates          300 Cocaine          300 THC              50 01/22/2010 1712    Alcohol Level: No results found for this basename: ETH:2 in the last 168 hours  Imaging: Dg Chest 2 View  10/09/2012  *RADIOLOGY REPORT*  Clinical Data: Weakness, syncope  CHEST - 2 VIEW  Comparison: None  Findings: Cardiomediastinal silhouette is unremarkable.  Mild hyperinflation.  Mild degenerative changes thoracic spine.  No acute infiltrate or pulmonary edema.  IMPRESSION: Mild hyperinflation.  No active disease.   Original Report Authenticated By: Lahoma Crocker, M.D.    Mr Brain Wo Contrast  10/09/2012  *RADIOLOGY REPORT*  Clinical Data: 62 year old male with syncopal episode.  MRI HEAD WITHOUT CONTRAST  Technique:  Multiplanar, multiecho pulse sequences of the brain and surrounding structures were obtained according to standard protocol without intravenous contrast.  Comparison: Head CT without contrast 02/25/2012.  Findings: There are small foci of mildly restricted diffusion in the bilateral superior occipital lobe white matter (series 4 images 16 and 17).  No other diffusion abnormality is identified.  However, there is extensive chronic ischemic disease evident throughout the cerebral white matter, deep gray matter nuclei, and to a lesser extent brainstem, and cerebellum.  Numerous chronic lacunar infarcts and confluent cerebral white matter T2 and FLAIR hyperintensity.  There are scattered chronic micro hemorrhages in the brain, mostly in the posterior circulation.  Major intracranial vascular flow voids are preserved.  No ventriculomegaly. No midline shift, mass effect, or evidence of mass lesion.  Negative pituitary, cervicomedullary junction and visualized cervical spine.  Visualized bone marrow signal is within normal limits.  Visualized orbit soft tissues are within normal limits.  Mild paranasal sinus mucosal thickening.  Trace left mastoid effusion.  Negative visualized nasopharynx.  Negative scalp soft tissues.  IMPRESSION: 1.  Probable subacute white matter (small vessel) infarcts in both occipital lobes.  No mass effect or hemorrhage. 2.  Very severe chronic small vessel ischemia, most pronounced in the cerebral hemispheres but also in the brainstem and cerebellum.   Original Report Authenticated By: Roselyn Reef, M.D.    US Renal  10/09/2012  *RADIOLOGY REPORT*  Clinical Data: Acute renal failure.  RENAL/URINARY TRACT ULTRASOUND COMPLETE  Comparison:  None available.  Findings:  Right Kidney:  The right kidney is slightly more echogenic than the index organ, the liver.  No focal mass lesion or hydronephrosis present.  There are no stones.  The maximal length is 10.3 cm.  Left Kidney:  The left kidney measures 11.1 cm maximally.  It is also slightly echogenic to the index organ, the liver. A complex lesion at the upper pole demonstrates no clear shadowing.  It measures 1.5 x 1.5 x 1.2 cm.  The lesions at the lower pole likely represent complex cysts, measuring 1.1 x 0.7 x 0.9 cm and 1.7 x 1.4 x 1.7 cm respectively.  Bladder:  Normal.  IMPRESSION:  1.  Bilateral small echogenic kidneys is compatible with nonspecific medical renal disease. 2.  Complex exophytic lesions likely represent a complex renal cyst.  These are incompletely characterized.  Non-emergent MRI should be deferred until patient has been discharged for the acute illness, and can optimally cooperate with positioning and breath- holding instructions.   Original Report Authenticated By: San Morelle, M.D.      Assessment/Plan:  Patient Active Hospital Problem List: Syncope and collapse (10/09/2012)   Assessment: Patient with an episode of syncope.  MRI performed and has concerns raised for subacute infarcts.  These are clearly not acute and have not contributed to his presenting syncopal event.  Patient on no  antiplatelet therapy at home.  Although patient does not admit to left sided findings on his exam at baseline after review of his chart do not believe that his current exam findings are new.  Patient likely very close to baseline.  Syncopal work up indicated though-patient does have significant small vessel disease   Plan:  1.  EEG  2.  Agree with start of ASA and its continuation  3.  Would rule out vertebrobasilar disease.  Patient unable to have a CTA due to elevated creatinine.  Would consider MRA of the brain.    4.  Orthostatic BP.  5.  Telemetry monitoring   Alexis Goodell, MD [facility] [phone] 10/09/2012, 11:37 PM

## 2012-10-09 NOTE — ED Notes (Signed)
Pt was a family visiting ED patient. Other family members came to nurse station for assistance when this pt became unresponsive. Family states pt has had a cough. When got pt into ED bed pt was very diaphoretic and didn't remember incident. CBG wnl. Pt put on monitor, high flow O2, and IV. EDP and ED staff in room

## 2012-10-09 NOTE — ED Notes (Addendum)
Patient transported to Ultrasound and then MRI

## 2012-10-09 NOTE — ED Notes (Signed)
Pt given sandwich and something to drink after passed swallow screen.

## 2012-10-09 NOTE — ED Notes (Signed)
Family at bedside. 

## 2012-10-09 NOTE — H&P (Signed)
PCP:   Kristine Garbe, MD   Chief Complaint:  Syncope.   HPI: This is a 62 year old male, with known history of  HTN, ETOH abuse, tobacco abuse, fall possibly due to syncope in 12/2009, with resultant intracerebral contusion, s/p CVA 3 years ago, resulting in gait instability, Shingles left C3 dermatome 2 years ago, s/p bilateral rotator cuff surgeries, 1978 and 1998. Patient currently ambulates with a cane. He was brought in a wheel chair to [facility] ED  By a family member, to visit his spouse, and while in the room, he blacked out in his wheel chair, and was noticed slumped in it. He denies antecedent chest pain, SOB, or palpitations. He had made no attempt, to get up out of the chair, when episode occurred. Patient denies antecedent acute respiratory tract or other illness. Patient noticed a non-pruritic rash on his body today.   Allergies:   Allergies  Allergen Reactions  . Atacand Hct (Candesartan Cilexetil-Hctz) Hives  . Shellfish Allergy Hives      Past Medical History  Diagnosis Date  . Hypertension     History reviewed. No pertinent past surgical history.  Prior to Admission medications   Medication Sig Start Date End Date Taking? Authorizing Provider  hydrALAZINE (APRESOLINE) 10 MG tablet Take 10 mg by mouth 4 (four) times daily.   Yes Historical Provider, MD  hydrochlorothiazide (HYDRODIURIL) 25 MG tablet Take 25 mg by mouth daily.   Yes Historical Provider, MD  lisinopril (PRINIVIL,ZESTRIL) 20 MG tablet Take 20 mg by mouth daily.   Yes Historical Provider, MD    Social History: Patient does not have a smoking history on file. He does not have any smokeless tobacco history on file. His alcohol and drug histories not on file.  Family History: Patient's mother is living at 53 years of age, with HTN, s/p recent CVA. Father is deceased, but health history os unknown.   Review of Systems:  As per HPI and chief complaint. Patent denies fatigue, diminished appetite, weight loss,  fever, chills, headache, blurred vision, difficulty in speaking, dysphagia, chest pain, cough, shortness of breath, orthopnea, paroxysmal nocturnal dyspnea, nausea, diaphoresis, abdominal pain, vomiting, diarrhea, belching, heartburn, hematemesis, melena, dysuria, nocturia, urinary frequency, hematochezia, lower extremity swelling, pain, or redness. He has noticed a non-pruritic rash all over his body today. The rest of the systems review is negative.  Physical Exam:  General:  Patient does not appear to be in obvious acute distress. Alert, communicative, fully oriented, talking in complete sentences, not short of breath at rest.  HEENT:  No clinical pallor, no jaundice, no conjunctival injection or discharge. Buccal mucosa appears "dry".  NECK:  Supple, JVP not seen, no carotid bruits, no palpable lymphadenopathy, no palpable goiter. CHEST:  Clinically clear to auscultation, no wheezes, no crackles. HEART:  Sounds 1 and 2 heard, normal, regular, no murmurs. ABDOMEN:  Full, soft, no scars, non-tender, no palpable organomegaly, no palpable masses, normal bowel sounds. GENITALIA:  Not examined. LOWER EXTREMITIES:  No pitting edema, palpable peripheral pulses. MUSCULOSKELETAL SYSTEM:  Generalized osteoarthritic changes, otherwise, normal. CENTRAL NERVOUS SYSTEM:  No focal neurologic deficit on gross examination.  Labs on Admission:  Results for orders placed during the hospital encounter of 10/09/12 (from the past 48 hour(s))  CBC WITH DIFFERENTIAL     Status: Abnormal   Collection Time   10/09/12 10:50 AM      Component Value Range Comment   WBC 10.5  4.0 - 10.5 K/uL    RBC 4.45  4.22 - 5.81 MIL/uL    Hemoglobin 13.7  13.0 - 17.0 g/dL    HCT 40.2  39.0 - 52.0 %    MCV 90.3  78.0 - 100.0 fL    MCH 30.8  26.0 - 34.0 pg    MCHC 34.1  30.0 - 36.0 g/dL    RDW 13.5  11.5 - 15.5 %    Platelets 204  150 - 400 K/uL    Neutrophils Relative 66  43 - 77 %    Neutro Abs 6.9  1.7 - 7.7 K/uL     Lymphocytes Relative 22  12 - 46 %    Lymphs Abs 2.4  0.7 - 4.0 K/uL    Monocytes Relative 11  3 - 12 %    Monocytes Absolute 1.2 (*) 0.1 - 1.0 K/uL    Eosinophils Relative 0  0 - 5 %    Eosinophils Absolute 0.0  0.0 - 0.7 K/uL    Basophils Relative 0  0 - 1 %    Basophils Absolute 0.0  0.0 - 0.1 K/uL   COMPREHENSIVE METABOLIC PANEL     Status: Abnormal   Collection Time   10/09/12 10:50 AM      Component Value Range Comment   Sodium 132 (*) 135 - 145 mEq/L    Potassium 4.6  3.5 - 5.1 mEq/L    Chloride 98  96 - 112 mEq/L    CO2 22  19 - 32 mEq/L    Glucose, Bld 110 (*) 70 - 99 mg/dL    BUN 31 (*) 6 - 23 mg/dL    Creatinine, Ser 2.39 (*) 0.50 - 1.35 mg/dL    Calcium 10.3  8.4 - 10.5 mg/dL    Total Protein 6.9  6.0 - 8.3 g/dL    Albumin 3.5  3.5 - 5.2 g/dL    AST 24  0 - 37 U/L    ALT 13  0 - 53 U/L    Alkaline Phosphatase 90  39 - 117 U/L    Total Bilirubin 0.2 (*) 0.3 - 1.2 mg/dL    GFR calc non Af Amer 28 (*) >90 mL/min    GFR calc Af Amer 32 (*) >90 mL/min   POCT I-STAT TROPONIN I     Status: Normal   Collection Time   10/09/12 10:54 AM      Component Value Range Comment   Troponin i, poc 0.05  0.00 - 0.08 ng/mL    Comment 3              Radiological Exams on Admission: *RADIOLOGY REPORT*  Clinical Data: Weakness, syncope  CHEST - 2 VIEW  Comparison: None  Findings: Cardiomediastinal silhouette is unremarkable. Mild  hyperinflation. Mild degenerative changes thoracic spine. No  acute infiltrate or pulmonary edema.  IMPRESSION:  Mild hyperinflation. No active disease.  Original Report Authenticated By: Lahoma Crocker, M.D.   Assessment/Plan Active Problems:    1. Syncope and collapse: Patient suffered a syncopal episode, while sitting in wheelchair in the ED, without antecedent acute ill ness, chest pain, SOB or palpitations. 12-Lead EKG showed SR, and no acute ischemic changes, although there are old Q-waves in V1-V3. No arrhythmias are evident on telemetry so far,  and he has no focal neurologic deficit. Diferential diagnostic considerations are wide, and include cardiogenic syncope, arrhythmia, seizure episode. Will admit for observation to telemetry, cycle cardiac enzymes, arrange 2D echocardiogram, check D-Dimer and do brain MRI. Will check UDS.  2. HTN (hypertension): BP  appears reasonably controlled at this time . Will observe.  3. ETOH abuse: Patient admits to drinking about a half pint of liquor during the course of 2-weeks. Last time he imbibed, was on 10/03/2012. He has no features of ETOH withdrawal at this time. We shall place him on CIWA protocol.  4. Tobacco abuse: Patient smokes about a pack and a half of cigarettes per day, and has done so for about 40 years. Counseled appropriately. We shall place him on Nicoderm CQ patch.  5. Dehydration/ARF: Creatinine was 2.39 at presentation, with BUN 31. Baseline creatinine was 1.02-1.06 in 01/2010. Suspect the culprit is dehydration, due to poor oral intake, against a background of continuing ACE-i and diuretic therapy. Have placed these culprit medications on hold, and will commence iv fluids. Follow renal indices, and do renal ultrasound.  6. Rash: Patient has a generalized non-pruritic rash with discrete oval shaped lesions, each having a collarette of scales, oriented along the planes of the body. Clinically, this is consistent with Pityriasis versicolor, although a sentinel lesion is not evident. We shall manage with Lotrisone cream, but for completeness, do HIV and RPR tests.   Further management will depend on clinical course.   Comment: Patient is FULL CODE.   Note: Patient's main contact is his sister, Jerrye Beavers. Tel (H) [phone]. (C): [phone]  Time Spent on Admission: 1 hour.   Hershey Knauer,CHRISTOPHER 10/09/2012, 2:33 PM

## 2012-10-10 ENCOUNTER — Observation Stay (HOSPITAL_COMMUNITY): Payer: Medicare Other

## 2012-10-10 DIAGNOSIS — E860 Dehydration: Secondary | ICD-10-CM

## 2012-10-10 DIAGNOSIS — F17200 Nicotine dependence, unspecified, uncomplicated: Secondary | ICD-10-CM

## 2012-10-10 DIAGNOSIS — I10 Essential (primary) hypertension: Secondary | ICD-10-CM

## 2012-10-10 LAB — COMPREHENSIVE METABOLIC PANEL
ALT: 14 U/L (ref 0–53)
AST: 23 U/L (ref 0–37)
Albumin: 3.3 g/dL — ABNORMAL LOW (ref 3.5–5.2)
Alkaline Phosphatase: 82 U/L (ref 39–117)
BUN: 37 mg/dL — ABNORMAL HIGH (ref 6–23)
CO2: 23 mEq/L (ref 19–32)
Calcium: 10.2 mg/dL (ref 8.4–10.5)
Chloride: 100 mEq/L (ref 96–112)
Creatinine, Ser: 2.54 mg/dL — ABNORMAL HIGH (ref 0.50–1.35)
GFR calc Af Amer: 30 mL/min — ABNORMAL LOW (ref >90)
GFR calc non Af Amer: 26 mL/min — ABNORMAL LOW (ref >90)
Glucose, Bld: 116 mg/dL — ABNORMAL HIGH (ref 70–99)
Potassium: 4.2 mEq/L (ref 3.5–5.1)
Sodium: 135 mEq/L (ref 135–145)
Total Bilirubin: 0.3 mg/dL (ref 0.3–1.2)
Total Protein: 6.9 g/dL (ref 6.0–8.3)

## 2012-10-10 LAB — TROPONIN I: Troponin I: <0.3 ng/mL (ref <0.30)

## 2012-10-10 MED ORDER — TECHNETIUM TC 99M DIETHYLENETRIAME-PENTAACETIC ACID: 40.0000 | Freq: Once | INTRAVENOUS | Status: AC | PRN

## 2012-10-10 MED ORDER — TECHNETIUM TO 99M ALBUMIN AGGREGATED
3.0000 | Freq: Once | INTRAVENOUS | Status: AC | PRN
  Administered 2012-10-10: 3 via INTRAVENOUS

## 2012-10-10 NOTE — Care Management Note (Signed)
    Page 1 of 1   10/11/2012     2:15:58 PM   CARE MANAGEMENT NOTE 10/11/2012  Patient:  Darrell [name], Darrell [name]   Account Number:  [id number]  Date Initiated:  10/10/2012  Documentation initiated by:  GRAVES-BIGELOW,Jakeira Seeman  Subjective/Objective Assessment:   Pt admitted with syncope.     Action/Plan:   CM will continue to monitor for disposition needs.   Anticipated DC Date:  10/12/2012   Anticipated DC Plan:  [place]  CM consult      Choice offered to / List presented to:  C-1 Patient        [place] arranged  [place] [facility]      [place].   Status of service:  Completed, signed off Medicare Important Message given?   (If response is "NO", the following Medicare IM given date fields will be blank) Date Medicare IM given:   Date Additional Medicare IM given:    Discharge Disposition:  [place]  Per UR Regulation:  Reviewed for med. necessity/level of care/duration of stay  If discussed at [place] of Stay Meetings, dates discussed:    Comments:  Referral made to [facility] for above services. SOC to begin within 24-48 hours post d/c.

## 2012-10-10 NOTE — Progress Notes (Signed)
EEG COMPLETED

## 2012-10-10 NOTE — Progress Notes (Signed)
  Echocardiogram 2D Echocardiogram has been performed.  Shauntea Lok 10/10/2012, 11:13 AM

## 2012-10-10 NOTE — Procedures (Signed)
EEG report.  Brief clinical history: 62 years old male who sustained an isolated episode of brief alteration of consciousness. Differential diagnosis is syncope versus seizures. No prior history of frank epileptic seizures.  Technique: this is a 17 channel routine scalp EEG performed at the bedside with bipolar and monopolar montages arranged in accordance to the international 10/20 system of electrode placement. One channel was dedicated to EKG recording. The study was performed predominantly during sleep, with a very brief period of wakefulness. Intermittent photic stimulation was the sole activating procedure employed during the test.  Description: Patient spent a very limited time in the wakeful state and then falls asleep and remains asleep for the most part of the recording. However, during that brief period of wakefulness, a posterior dominant, poorly sustained, symmetric, and medium amplitude 9 Hz rhythm was observed. The sleep portion of the test demonstrated normal architecture. Intermittent photic stimulation did not induce a driving response. No focal or generalized epileptiform discharges noted. No pathologic areas of slowing seen. EKG showed sinus rhythm.   Impression: this is a normal EEG performed predominantly during sleep. Please, be aware that a normal EEG does not exclude the possibility of epilepsy. Clinical correlation is advised.  Dorian Pod, MD

## 2012-10-10 NOTE — Progress Notes (Signed)
Mr. Shallow was admitted 1/15 for syncope event. MRI brain results called to NP from RN. MRI report reviewed which showed old lacunar infarcts and possibly sub acute occipital infarcts along with severe small vessel ischemia. Neuro consult called. See note. Pt is presently on ASA which is there recommendation pending further testing.  Clance Boll, NP [facility]

## 2012-10-10 NOTE — Progress Notes (Signed)
UR Completed Daniel Ritthaler Graves-Bigelow, RN,BSN [phone]  

## 2012-10-10 NOTE — Progress Notes (Signed)
[facility] PROGRESS NOTE  [place] [id number] DOB: [birth year] DOA: 10/09/2012 PCP: Kristine Garbe, MD  Assessment/Plan: 1-Syncope and collapse: probably due to TIA vs orthostasis; patient reports he has not been eating, drinking or even taking his medications properly recently; wife is sick and he has been taking care of her all the time. Patient MRI with subacute infarct findings; neuro consulted; will follow recommendations and continue ASA. Full TIA Work up in process; will follow studies results.  2-HTN: stable; continue monitoring  3-hx of alcohol abuse: counseling provided; no acute withdrawal signs or symptoms at this moment. Continue CIWA  4-tobacco abuse: continue nicotine patch  5-acute renal failure: Cr improving. Continue holding ACE and diuretics. Patient drinking well; will change IVF's to [facility]  6-Skin rash: continue lotrisone. HIV and RPR negative/non reactive.  [id number]  Code Status: Full Family Communication:  Disposition Plan: patient will like to go home at discharge if possible. Will ask PT to evaluate.   Consultants:  neuro  Procedures:  See below for x-ray reports  2-d echo (pending)  Antibiotics:  none  HPI/Subjective: Feeling better. No fever and no acute complaints.  Objective: Filed Vitals:   10/10/12 0200 10/10/12 0400 10/10/12 0600 10/10/12 0800  BP: 123/87 124/86 145/90 123/78  Pulse: 95 92 95 90  Temp:  98.3 F (36.8 C)  98.7 F (37.1 C)  TempSrc:  Oral  Oral  Resp:  20 20 18   Weight:  78.4 kg (172 lb 13.5 oz)    SpO2: 95% 95% 96% 97%    Intake/Output Summary (Last 24 hours) at 10/10/12 1449 Last data filed at 10/10/12 0600  Gross per 24 hour  Intake    240 ml  Output    900 ml  Net   -660 ml   Filed Weights   10/10/12 0000 10/10/12 0400  Weight: 78.427 kg (172 lb 14.4 oz) 78.4 kg (172 lb 13.5 oz)    Exam:   General:  AAOX3, NAD; feeling better  Cardiovascular: s1 and S2, no rubs or  gallops  Respiratory: CTA bilaterally  Abdomen: soft, NT, ND positive BS  Extremities: no edema  Neuro: 4/5 MS on left side (subjectively acute; but base on records patient has had same deficit since last stroke). Otherwise no acute abnormalities on exam.  Data Reviewed: Basic Metabolic Panel:  Lab [id number] 0256 10/09/12 1503 10/09/12 1050  NA 135 -- 132*  K 4.2 -- 4.6  CL 100 -- 98  CO2 23 -- 22  GLUCOSE 116* -- 110*  BUN 37* -- 31*  CREATININE 2.54* 2.43* 2.39*  CALCIUM 10.2 -- 10.3  MG -- -- --  PHOS -- -- --   Liver Function Tests:  Lab 10/10/12 0256 10/09/12 1050  AST 23 24  ALT 14 13  ALKPHOS 82 90  BILITOT 0.3 0.2*  PROT 6.9 6.9  ALBUMIN 3.3* 3.5   CBC:  Lab 10/09/12 1503 10/09/12 1050  WBC 8.7 10.5  NEUTROABS 6.6 6.9  HGB 14.0 13.7  HCT 41.0 40.2  MCV 89.7 90.3  PLT 217 204   Cardiac Enzymes:  Lab 10/10/12 0256 10/09/12 2040 10/09/12 1503  CKTOTAL -- -- --  CKMB -- -- --  CKMBINDEX -- -- --  TROPONINI <0.30 <0.30 <0.30    Studies: Dg Chest 2 View  10/09/2012  *RADIOLOGY REPORT*  Clinical Data: Weakness, syncope  CHEST - 2 VIEW  Comparison: None  Findings: Cardiomediastinal silhouette is unremarkable.  Mild hyperinflation.  Mild degenerative changes thoracic spine.  No  acute infiltrate or pulmonary edema.  IMPRESSION: Mild hyperinflation.  No active disease.   Original Report Authenticated By: Lahoma Crocker, M.D.    Mr Brain Wo Contrast  10/09/2012  *RADIOLOGY REPORT*  Clinical Data: 62 year old male with syncopal episode.  MRI HEAD WITHOUT CONTRAST  Technique:  Multiplanar, multiecho pulse sequences of the brain and surrounding structures were obtained according to standard protocol without intravenous contrast.  Comparison: Head CT without contrast 02/25/2012.  Findings: There are small foci of mildly restricted diffusion in the bilateral superior occipital lobe white matter (series 4 images 16 and 17).  No other diffusion abnormality is identified.   However, there is extensive chronic ischemic disease evident throughout the cerebral white matter, deep gray matter nuclei, and to a lesser extent brainstem, and cerebellum.  Numerous chronic lacunar infarcts and confluent cerebral white matter T2 and FLAIR hyperintensity.  There are scattered chronic micro hemorrhages in the brain, mostly in the posterior circulation.  Major intracranial vascular flow voids are preserved.  No ventriculomegaly. No midline shift, mass effect, or evidence of mass lesion.  Negative pituitary, cervicomedullary junction and visualized cervical spine.  Visualized bone marrow signal is within normal limits.  Visualized orbit soft tissues are within normal limits.  Mild paranasal sinus mucosal thickening.  Trace left mastoid effusion.  Negative visualized nasopharynx.  Negative scalp soft tissues.  IMPRESSION: 1.  Probable subacute white matter (small vessel) infarcts in both occipital lobes.  No mass effect or hemorrhage. 2.  Very severe chronic small vessel ischemia, most pronounced in the cerebral hemispheres but also in the brainstem and cerebellum.   Original Report Authenticated By: Roselyn Reef, M.D.    US Renal  10/09/2012  *RADIOLOGY REPORT*  Clinical Data: Acute renal failure.  RENAL/URINARY TRACT ULTRASOUND COMPLETE  Comparison:  None available.  Findings:  Right Kidney:  The right kidney is slightly more echogenic than the index organ, the liver.  No focal mass lesion or hydronephrosis present.  There are no stones.  The maximal length is 10.3 cm.  Left Kidney:  The left kidney measures 11.1 cm maximally.  It is also slightly echogenic to the index organ, the liver. A complex lesion at the upper pole demonstrates no clear shadowing.  It measures 1.5 x 1.5 x 1.2 cm.  The lesions at the lower pole likely represent complex cysts, measuring 1.1 x 0.7 x 0.9 cm and 1.7 x 1.4 x 1.7 cm respectively.  Bladder:  Normal.  IMPRESSION:  1.  Bilateral small echogenic kidneys is compatible  with nonspecific medical renal disease. 2.  Complex exophytic lesions likely represent a complex renal cyst.  These are incompletely characterized.  Non-emergent MRI should be deferred until patient has been discharged for the acute illness, and can optimally cooperate with positioning and breath- holding instructions.   Original Report Authenticated By: San Morelle, M.D.     Scheduled Meds:   . aspirin EC  81 mg Oral Daily  . clotrimazole-betamethasone   Topical BID  . folic acid  1 mg Oral Daily  . heparin  5,000 Units Subcutaneous Q8H  . hydrALAZINE  10 mg Oral QID  . multivitamin with minerals  1 tablet Oral Daily  . nicotine  21 mg Transdermal Daily  . sodium chloride  3 mL Intravenous Q12H  . thiamine  100 mg Oral Daily   Or  . thiamine  100 mg Intravenous Daily   Continuous Infusions:   . sodium chloride 75 mL/hr at 10/09/12 1834    Active  Problems:  Syncope and collapse  HTN (hypertension)  ETOH abuse  Tobacco abuse  Rash  Dehydration  ARF (acute renal failure)    Time spent: >30 minutes    Raeden Belzer  [facility] Pager [phone]. If 8PM-8AM, please contact night-coverage at [email or web address], password [facility] 10/10/2012, 2:49 PM  LOS: 1 day

## 2012-10-11 MED ORDER — ASPIRIN 81 MG PO TBEC: 81.0000 mg | DELAYED_RELEASE_TABLET | Freq: Every day | ORAL | Status: DC

## 2012-10-11 MED ORDER — ADULT MULTIVITAMIN W/MINERALS CH: 1.0000 | ORAL_TABLET | Freq: Every day | ORAL | Status: DC

## 2012-10-11 MED ORDER — CLOTRIMAZOLE-BETAMETHASONE 1-0.05 % EX CREA: TOPICAL_CREAM | Freq: Two times a day (BID) | CUTANEOUS | Status: DC

## 2012-10-11 MED ORDER — LISINOPRIL 20 MG PO TABS: 20.0000 mg | ORAL_TABLET | Freq: Every day | ORAL | Status: DC

## 2012-10-11 MED ORDER — ISOSORB DINITRATE-HYDRALAZINE 20-37.5 MG PO TABS: 1.0000 | ORAL_TABLET | Freq: Two times a day (BID) | ORAL | Status: DC

## 2012-10-11 NOTE — Progress Notes (Signed)
NEURO HOSPITALIST PROGRESS NOTE   SUBJECTIVE:                                                                                                                        Patient is alert, oriented and feel back to his baseline.  He is wanting to go home.    OBJECTIVE:                                                                                                                           Vital signs in last 24 hours: Temp:  [97.1 F (36.2 C)-98.8 F (37.1 C)] 98.2 F (36.8 C) (01/17 0400) Pulse Rate:  [72-86] 83  (01/17 0400) Resp:  [20] 20  (01/17 0400) BP: (143-165)/(90-96) 146/96 mmHg (01/17 0400) SpO2:  [94 %-98 %] 94 % (01/17 0400)  Intake/Output from previous day: 01/16 0701 - 01/17 0700 In: 600 [P.O.:600] Out: 300 [Urine:300] Intake/Output this shift:   Nutritional status: Cardiac  Past Medical History  Diagnosis Date  . Hypertension   . Stroke      Neurologic Exam:   Mental Status:  Alert, oriented, thought content appropriate. Speech fluent without evidence of aphasia. Able to follow 3 step commands without difficulty.  Cranial Nerves:  II: Visual fields grossly normal, pupils equal, round, reactive to light and accommodation  III,IV, VI: ptosis not present, extra-ocular motions intact bilaterally  V,VII: slight left facial droop, facial light touch sensation normal bilaterally  VIII: hearing normal bilaterally  IX,X: gag reflex present  XI: bilateral shoulder shrug  XII: midline tongue extension  Motor:  5/5 throughout Tone and bulk:normal tone throughout; no atrophy noted  Sensory: Pinprick and light touch intact throughout, bilaterally  Deep Tendon Reflexes: 2+ and symmetric in the UE, 1+ left AJ, 4+ right AJ clonus  Plantars:  [place] bilaterally Cerebellar:  Continues to have mild dysmetria with finger to nose on the left. Heel to shin intact bilaterally.   Lab Results: No results found for this basename: cbc, bmp,  coags, chol, tri, ldl, hga1c   Lipid Panel No results found for this basename: CHOL,TRIG,HDL,CHOLHDL,VLDL,LDLCALC in the last 72 hours  Studies/Results: Dg Chest 2 View  10/10/2012  *RADIOLOGY REPORT*  Clinical Data: Weakness, syncope, pre VQ  scan  CHEST - 2 VIEW  Comparison: Chest x-ray of 10/09/2012  Findings:  The lungs are clear and slightly hyperaerated. Mediastinal contours appear stable.  The heart is within normal limits in size.  No acute bony abnormality is seen.  IMPRESSION: No active lung disease.  Hyperaeration.   Original Report Authenticated By: Ivar Drape, M.D.    Mr Brain Wo Contrast  10/09/2012  *RADIOLOGY REPORT*  Clinical Data: 62 year old male with syncopal episode.  MRI HEAD WITHOUT CONTRAST  Technique:  Multiplanar, multiecho pulse sequences of the brain and surrounding structures were obtained according to standard protocol without intravenous contrast.  Comparison: Head CT without contrast 02/25/2012.  Findings: There are small foci of mildly restricted diffusion in the bilateral superior occipital lobe white matter (series 4 images 16 and 17).  No other diffusion abnormality is identified.  However, there is extensive chronic ischemic disease evident throughout the cerebral white matter, deep gray matter nuclei, and to a lesser extent brainstem, and cerebellum.  Numerous chronic lacunar infarcts and confluent cerebral white matter T2 and FLAIR hyperintensity.  There are scattered chronic micro hemorrhages in the brain, mostly in the posterior circulation.  Major intracranial vascular flow voids are preserved.  No ventriculomegaly. No midline shift, mass effect, or evidence of mass lesion.  Negative pituitary, cervicomedullary junction and visualized cervical spine.  Visualized bone marrow signal is within normal limits.  Visualized orbit soft tissues are within normal limits.  Mild paranasal sinus mucosal thickening.  Trace left mastoid effusion.  Negative visualized nasopharynx.   Negative scalp soft tissues.  IMPRESSION: 1.  Probable subacute white matter (small vessel) infarcts in both occipital lobes.  No mass effect or hemorrhage. 2.  Very severe chronic small vessel ischemia, most pronounced in the cerebral hemispheres but also in the brainstem and cerebellum.   Original Report Authenticated By: Roselyn Reef, M.D.    US Renal  10/09/2012  *RADIOLOGY REPORT*  Clinical Data: Acute renal failure.  RENAL/URINARY TRACT ULTRASOUND COMPLETE  Comparison:  None available.  Findings:  Right Kidney:  The right kidney is slightly more echogenic than the index organ, the liver.  No focal mass lesion or hydronephrosis present.  There are no stones.  The maximal length is 10.3 cm.  Left Kidney:  The left kidney measures 11.1 cm maximally.  It is also slightly echogenic to the index organ, the liver. A complex lesion at the upper pole demonstrates no clear shadowing.  It measures 1.5 x 1.5 x 1.2 cm.  The lesions at the lower pole likely represent complex cysts, measuring 1.1 x 0.7 x 0.9 cm and 1.7 x 1.4 x 1.7 cm respectively.  Bladder:  Normal.  IMPRESSION:  1.  Bilateral small echogenic kidneys is compatible with nonspecific medical renal disease. 2.  Complex exophytic lesions likely represent a complex renal cyst.  These are incompletely characterized.  Non-emergent MRI should be deferred until patient has been discharged for the acute illness, and can optimally cooperate with positioning and breath- holding instructions.   Original Report Authenticated By: San Morelle, M.D.    Nm Pulmonary Perf And Vent  10/10/2012  *RADIOLOGY REPORT*  Clinical Data:  Syncope, elevated D-dimer, history hypertension and stroke  NUCLEAR MEDICINE VENTILATION - PERFUSION LUNG SCAN  Technique:  Ventilation images were obtained in multiple projections using inhaled aerosol technetium 99 M DTPA.  Perfusion images were obtained in multiple projections after intravenous injection of Tc-60m MAA.   Radiopharmaceuticals:  67mCi Tc-31m DTPA aerosol and 3 mCi Tc-33m MAA.  Comparison: Chest radiograph 10/10/2012  Findings:  Ventilation:   No focal ventilation defect.  Airway deposition of aerosol is noted within the trachea.  Perfusion:   No wedge shaped peripheral perfusion defects to suggest acute pulmonary embolism.  Small amount of swallowed tracer is present within the stomach.  IMPRESSION: Normal ventilation and perfusion lung scans.   Original Report Authenticated By: Lavonia Dana, M.D.     MEDICATIONS                                                                                                                        Scheduled:   . aspirin EC  81 mg Oral Daily  . clotrimazole-betamethasone   Topical BID  . folic acid  1 mg Oral Daily  . heparin  5,000 Units Subcutaneous Q8H  . hydrALAZINE  10 mg Oral QID  . multivitamin with minerals  1 tablet Oral Daily  . nicotine  21 mg Transdermal Daily  . sodium chloride  3 mL Intravenous Q12H  . thiamine  100 mg Oral Daily   Or  . thiamine  100 mg Intravenous Daily    ASSESSMENT/PLAN:                                                                                                               Patient Active Hospital Problem List: Syncope and collapse (10/09/2012)   Assessment: Patient is back to his baseline. MRI of brain showed no acute infarct, 2 D echo was normal and EEG showed no epileptiform activity or irritability.     Plan:  1) Continue ASA 2) Follow up out patient neurology or PCP.   No further recommendations.  Neurology will S/O    Etta Quill PA-C [facility] [phone]  10/11/2012, 11:14 AM

## 2012-10-11 NOTE — Evaluation (Signed)
Physical Therapy Evaluation Patient Details Name: Darrell [name] MRN: [id number] DOB: Apr 03, 1951 Today's Date: 10/11/2012 Time: [id number] PT Time Calculation (min): 21 min  PT Assessment / Plan / Recommendation Clinical Impression  pt adm after a syncopal episode in ED when he brought his wife in to the ED.  He says he's close to baseline, but compare to answers to subjective questions, I feel he is not quite to baseline function.   Pt can benefit from HHPT at home to improve function and safety.  Will sign off at this time in anticipation of D/C and have nursing mobilize pt if he does not D/C tolday.    PT Assessment  All further PT needs can be met in the next venue of care    Follow Up Recommendations  Home health PT;Supervision for mobility/OOB    Does the patient have the potential to tolerate intense rehabilitation      Barriers to Discharge        Equipment Recommendations  None recommended by PT    Recommendations for Other Services     Frequency      Precautions / Restrictions Precautions Precautions: Fall   Pertinent Vitals/Pain       Mobility  Bed Mobility Bed Mobility: Supine to Sit;Sitting - Scoot to Edge of Bed Supine to Sit: 5: Supervision Sitting - Scoot to Edge of Bed: 5: Supervision Details for Bed Mobility Assistance: generally safe Transfers Transfers: Sit to Stand;Stand to Sit Sit to Stand: 4: Min guard;With upper extremity assist;From bed Stand to Sit: 4: Min assist;With upper extremity assist;To chair/3-in-1 Details for Transfer Assistance: vc's for  better technique, assist to control and direct descent Ambulation/Gait Ambulation/Gait Assistance: 4: Min assist Ambulation Distance (Feet): 75 Feet Assistive device: Straight cane;Other (Comment) (carved walking stick) Ambulation/Gait Assistance Details: step to with bias toward sidestepping to the left.  Trunk rotated R and pt 's L side leads.  pt frequently unsteady and falls of to the L and  posteriorly. Gait Pattern: Step-to pattern;Decreased step length - right;Decreased step length - left;Ataxic;Shuffle;Narrow base of support Stairs: No Wheelchair Mobility Wheelchair Mobility: No    Shoulder Instructions     Exercises     PT Diagnosis:    PT Problem List: Decreased strength;Decreased activity tolerance;Decreased balance;Decreased mobility;Decreased coordination PT Treatment Interventions:     PT Goals    Visit Information  Last PT Received On: 10/11/12 Assistance Needed: +1    Subjective Data  Subjective: I might not be as steady as usual..Marland KitchenI'm going to fall if someone or the wall is not close by at home, that's normal. Patient Stated Goal: Get home   Prior [place] Lives With: Spouse Available Help at Discharge: Family;Other (Comment) (daughter will help while wife is sick in the hospital) Type of Home: House Home Access: Ramped entrance Home Layout: One level Bathroom Shower/Tub: Tub/shower unit;Curtain Biochemist, clinical: [place]: Straight cane;Wheelchair - manual;Walker - rolling;Bedside commode/3-in-1;Shower chair with back Prior Function Level of Independence: Needs assistance;Independent with assistive device(s) (pt might be overstating his ability) Able to Take Stairs?: Yes Driving: No Communication Communication: No difficulties    Cognition  Overall Cognitive Status: Appears within functional limits for tasks assessed/performed Arousal/Alertness: Awake/alert Orientation Level: Appears intact for tasks assessed Behavior During Session: [facility] for tasks performed    Extremity/Trunk Assessment Right Upper Extremity Assessment RUE ROM/Strength/Tone: [facility] for tasks assessed Left Upper Extremity Assessment LUE ROM/Strength/Tone: [facility] for tasks assessed Right Lower Extremity Assessment RLE ROM/Strength/Tone: [facility] for  tasks assessed;Deficits RLE ROM/Strength/Tone Deficits: mild weakness and decr coordination Left  Lower Extremity Assessment LLE ROM/Strength/Tone: [facility] for tasks assessed;Deficits LLE ROM/Strength/Tone Deficits: see R LE Trunk Assessment Trunk Assessment: Normal   Balance Balance Balance Assessed: Yes Static Sitting Balance Static Sitting - Balance Support: Feet supported;No upper extremity supported Static Sitting - Level of Assistance: 5: Stand by assistance Static Standing Balance Static Standing - Balance Support: Right upper extremity supported;During functional activity Static Standing - Level of Assistance: 4: Min assist;5: Stand by assistance Dynamic Standing Balance Dynamic Standing - Balance Support: During functional activity;Left upper extremity supported (to pick up urinal off the floor) Dynamic Standing - Level of Assistance: Other (comment) (min guard)  End of Session PT - End of Session Equipment Utilized During Treatment: Gait belt Activity Tolerance: Patient limited by fatigue;Patient tolerated treatment well Patient left: in chair;with call bell/phone within reach Nurse Communication: Mobility status  GP Functional Assessment Tool Used: clinical judgement Functional Limitation: Mobility: Walking and moving around Mobility: Walking and Moving Around Current Status [id number]): At least 20 percent but less than 40 percent impaired, limited or restricted Mobility: Walking and Moving Around Goal Status [phone]): At least 1 percent but less than 20 percent impaired, limited or restricted Mobility: Walking and Moving Around Discharge Status [phone]): At least 1 percent but less than 20 percent impaired, limited or restricted   Amaliya Whitelaw, Tessie Fass 10/11/2012, 1:22 PM  10/11/2012  Donnella Sham, [place] [phone] (pager)

## 2012-10-11 NOTE — Discharge Summary (Signed)
Physician Discharge Summary  Darrell [name] [id number] DOB: Feb 04, 1951 DOA: 10/09/2012  PCP: Kristine Garbe, MD  Admit date: 10/09/2012 Discharge date: 10/11/2012  Time spent: >30 minutes  Recommendations for Outpatient Follow-up:  Repeat BMET to follow electrolytes and renal function Reevaluate BP and adjust medication as needed.  Discharge Diagnoses:  Active Problems:  Syncope and collapse  HTN (hypertension)  ETOH abuse  Tobacco abuse  Rash  Dehydration  ARF (acute renal failure) on chronic renal failure.   Discharge Condition: stable and improved. Discharged home with [facility] services (PT).  Diet recommendation: heart healthy diet  Filed Weights   10/10/12 0000 10/10/12 0400  Weight: 78.427 kg (172 lb 14.4 oz) 78.4 kg (172 lb 13.5 oz)    History of present illness:  62 year old male, with known history of HTN, ETOH abuse, tobacco abuse, fall possibly due to syncope in 12/2009, with resultant intracerebral contusion, s/p CVA 3 years ago, resulting in gait instability, Shingles left C3 dermatome 2 years ago, s/p bilateral rotator cuff surgeries, 1978 and 1998. Patient currently ambulates with a cane. He was brought in a wheel chair to [facility] ED By a family member, to visit his spouse, and while in the room, he blacked out in his wheel chair, and was noticed slumped in it. He denies antecedent chest pain, SOB, or palpitations. He had made no attempt, to get up out of the chair, when episode occurred. Patient denies antecedent acute respiratory tract or other illness. Patient noticed a non-pruritic rash on his body today.    Hospital Course:  1-Syncope and collapse: probably due to TIA vs orthostasis; patient reports he has not been eating, drinking or even taking his medications properly recently; wife is sick and he has been taking care of her all the time. Patient MRI with subacute infarct findings; neuro consulted and complete workup demonstrated no further abnormalities. At this point  will use ASA for secondary prevention, control risk factors and arrange HHPT.  2-HTN: was rising after stopping antihypertensive meds. Will recommend low sodium diet and will discharge on bidil and lisinopril. Patient will follow with PCP for further adjustments. HCTZ discontinue to avoid dehydration and orthostasis.  3-hx of alcohol abuse: counseling provided; no acute withdrawal signs or symptoms during admission. Discharge on MV daily for thiamine and folic acid supplementation.  4-tobacco abuse: counseling provided. Patient contemplating quitting.  5-acute renal failure on presumed chronic disease: Cr improved and pretty much back to baseline (based on GFR; had stage 2-3 at baseline; labs from 2011). Unfortunately no other recent records for comparison. Continue holding diuretics. Patient advised to keep himself well hydrated and to take medications as prescribed.  6-Skin rash: continue lotrisone. HIV and RPR negative/non reactive.   Procedures: MRI of brain showed no acute infarct, 2 D echo was normal and EEG showed no epileptiform activity or irritability.  See below for xray reports.  Consultations:  neurology  Discharge Exam: Filed Vitals:   10/10/12 2000 10/11/12 0000 10/11/12 0400 10/11/12 1055  BP: 143/90 159/91 146/96 147/94  Pulse: 83 86 83   Temp: 98.8 F (37.1 C) 98.2 F (36.8 C) 98.2 F (36.8 C)   TempSrc: Oral Oral Oral   Resp: 20 20 20    Weight:      SpO2: 98% 97% 94%     General: AAOX3, NAD; feeling better  Cardiovascular: s1 and S2, no rubs or gallops  Respiratory: CTA bilaterally  Abdomen: soft, NT, ND positive BS  Extremities: no edema  Neuro: 4/5  MS on left side (subjectively acute; but base on records patient has had same deficit since last stroke). Otherwise no acute abnormalities on exam.  Discharge Instructions  Discharge Orders    Future Orders Please Complete By Expires   Diet - low sodium heart healthy      Discharge instructions       Comments:   -Take medications as prescribed -Stop smoking -Stop alcohol intake -Arrange follow up with PCP in 2 weeks -Follow a low sodium heart healthy diet -Keep yourself hydrated       Medication List     As of 10/11/2012  2:15 PM    STOP taking these medications         hydrALAZINE 10 MG tablet   Commonly known as: APRESOLINE      hydrochlorothiazide 25 MG tablet   Commonly known as: HYDRODIURIL      TAKE these medications         aspirin 81 MG EC tablet   Take 1 tablet (81 mg total) by mouth daily.      clotrimazole-betamethasone cream   Commonly known as: LOTRISONE   Apply topically 2 (two) times daily. Apply to affected skin areas.      isosorbide-hydrALAZINE 20-37.5 MG per tablet   Commonly known as: BIDIL   Take 1 tablet by mouth 2 (two) times daily.      lisinopril 20 MG tablet   Commonly known as: PRINIVIL,ZESTRIL   Take 1 tablet (20 mg total) by mouth daily.      multivitamin with minerals Tabs   Take 1 tablet by mouth daily.           Follow-up Information    Follow up with REESE,BETTI D, MD. Schedule an appointment as soon as possible for a visit in 2 weeks.   Contact information:   [place] [address] [place] [address] [phone]           The results of significant diagnostics from this hospitalization (including imaging, microbiology, ancillary and laboratory) are listed below for reference.    Significant Diagnostic Studies: Dg Chest 2 View  10/10/2012  *RADIOLOGY REPORT*  Clinical Data: Weakness, syncope, pre VQ scan  CHEST - 2 VIEW  Comparison: Chest x-ray of 10/09/2012  Findings:  The lungs are clear and slightly hyperaerated. Mediastinal contours appear stable.  The heart is within normal limits in size.  No acute bony abnormality is seen.  IMPRESSION: No active lung disease.  Hyperaeration.   Original Report Authenticated By: Ivar Drape, M.D.    Dg Chest 2 View  10/09/2012  *RADIOLOGY REPORT*  Clinical Data:  Weakness, syncope  CHEST - 2 VIEW  Comparison: None  Findings: Cardiomediastinal silhouette is unremarkable.  Mild hyperinflation.  Mild degenerative changes thoracic spine.  No acute infiltrate or pulmonary edema.  IMPRESSION: Mild hyperinflation.  No active disease.   Original Report Authenticated By: Lahoma Crocker, M.D.    Mr Brain Wo Contrast  10/09/2012  *RADIOLOGY REPORT*  Clinical Data: 62 year old male with syncopal episode.  MRI HEAD WITHOUT CONTRAST  Technique:  Multiplanar, multiecho pulse sequences of the brain and surrounding structures were obtained according to standard protocol without intravenous contrast.  Comparison: Head CT without contrast 02/25/2012.  Findings: There are small foci of mildly restricted diffusion in the bilateral superior occipital lobe white matter (series 4 images 16 and 17).  No other diffusion abnormality is identified.  However, there is extensive chronic ischemic disease evident throughout the cerebral white matter,  deep gray matter nuclei, and to a lesser extent brainstem, and cerebellum.  Numerous chronic lacunar infarcts and confluent cerebral white matter T2 and FLAIR hyperintensity.  There are scattered chronic micro hemorrhages in the brain, mostly in the posterior circulation.  Major intracranial vascular flow voids are preserved.  No ventriculomegaly. No midline shift, mass effect, or evidence of mass lesion.  Negative pituitary, cervicomedullary junction and visualized cervical spine.  Visualized bone marrow signal is within normal limits.  Visualized orbit soft tissues are within normal limits.  Mild paranasal sinus mucosal thickening.  Trace left mastoid effusion.  Negative visualized nasopharynx.  Negative scalp soft tissues.  IMPRESSION: 1.  Probable subacute white matter (small vessel) infarcts in both occipital lobes.  No mass effect or hemorrhage. 2.  Very severe chronic small vessel ischemia, most pronounced in the cerebral hemispheres but also in the  brainstem and cerebellum.   Original Report Authenticated By: Roselyn Reef, M.D.    US Renal  10/09/2012  *RADIOLOGY REPORT*  Clinical Data: Acute renal failure.  RENAL/URINARY TRACT ULTRASOUND COMPLETE  Comparison:  None available.  Findings:  Right Kidney:  The right kidney is slightly more echogenic than the index organ, the liver.  No focal mass lesion or hydronephrosis present.  There are no stones.  The maximal length is 10.3 cm.  Left Kidney:  The left kidney measures 11.1 cm maximally.  It is also slightly echogenic to the index organ, the liver. A complex lesion at the upper pole demonstrates no clear shadowing.  It measures 1.5 x 1.5 x 1.2 cm.  The lesions at the lower pole likely represent complex cysts, measuring 1.1 x 0.7 x 0.9 cm and 1.7 x 1.4 x 1.7 cm respectively.  Bladder:  Normal.  IMPRESSION:  1.  Bilateral small echogenic kidneys is compatible with nonspecific medical renal disease. 2.  Complex exophytic lesions likely represent a complex renal cyst.  These are incompletely characterized.  Non-emergent MRI should be deferred until patient has been discharged for the acute illness, and can optimally cooperate with positioning and breath- holding instructions.   Original Report Authenticated By: San Morelle, M.D.    Nm Pulmonary Perf And Vent  10/10/2012  *RADIOLOGY REPORT*  Clinical Data:  Syncope, elevated D-dimer, history hypertension and stroke  NUCLEAR MEDICINE VENTILATION - PERFUSION LUNG SCAN  Technique:  Ventilation images were obtained in multiple projections using inhaled aerosol technetium 99 M DTPA.  Perfusion images were obtained in multiple projections after intravenous injection of Tc-62m MAA.  Radiopharmaceuticals:  63mCi Tc-49m DTPA aerosol and 3 mCi Tc-90m MAA.  Comparison: Chest radiograph 10/10/2012  Findings:  Ventilation:   No focal ventilation defect.  Airway deposition of aerosol is noted within the trachea.  Perfusion:   No wedge shaped peripheral perfusion  defects to suggest acute pulmonary embolism.  Small amount of swallowed tracer is present within the stomach.  IMPRESSION: Normal ventilation and perfusion lung scans.   Original Report Authenticated By: Lavonia Dana, M.D.    Labs: Basic Metabolic Panel:  Lab [id number] 10/09/12 1503 10/09/12 1050  NA 135 -- 132*  K 4.2 -- 4.6  CL 100 -- 98  CO2 23 -- 22  GLUCOSE 116* -- 110*  BUN 37* -- 31*  CREATININE 2.54* 2.43* 2.39*  CALCIUM 10.2 -- 10.3  MG -- -- --  PHOS -- -- --   Liver Function Tests:  Lab 10/10/12 0256 10/09/12 1050  AST 23 24  ALT 14 13  ALKPHOS 82 90  BILITOT  0.3 0.2*  PROT 6.9 6.9  ALBUMIN 3.3* 3.5   CBC:  Lab 10/09/12 1503 10/09/12 1050  WBC 8.7 10.5  NEUTROABS 6.6 6.9  HGB 14.0 13.7  HCT 41.0 40.2  MCV 89.7 90.3  PLT 217 204   Cardiac Enzymes:  Lab 10/10/12 0256 10/09/12 2040 10/09/12 1503  CKTOTAL -- -- --  CKMB -- -- --  CKMBINDEX -- -- --  TROPONINI <0.30 <0.30 <0.30    Signed:  Hamid Brookens  [facility] 10/11/2012, 2:15 PM

## 2013-02-27 ENCOUNTER — Emergency Department (HOSPITAL_COMMUNITY)
Admission: EM | Admit: 2013-02-27 | Discharge: 2013-02-27 | Disposition: A | Discharge status: Home / Self Care | Payer: Medicare Other | Attending: Emergency Medicine | Admitting: Emergency Medicine

## 2013-02-27 ENCOUNTER — Encounter (HOSPITAL_COMMUNITY): Payer: Self-pay | Admitting: Emergency Medicine

## 2013-02-27 ENCOUNTER — Emergency Department (HOSPITAL_COMMUNITY): Payer: Medicare Other

## 2013-02-27 DIAGNOSIS — Z79899 Other long term (current) drug therapy: Secondary | ICD-10-CM | POA: Insufficient documentation

## 2013-02-27 DIAGNOSIS — W1789XA Other fall from one level to another, initial encounter: Secondary | ICD-10-CM | POA: Insufficient documentation

## 2013-02-27 DIAGNOSIS — N289 Disorder of kidney and ureter, unspecified: Secondary | ICD-10-CM

## 2013-02-27 DIAGNOSIS — Z7982 Long term (current) use of aspirin: Secondary | ICD-10-CM | POA: Insufficient documentation

## 2013-02-27 DIAGNOSIS — Y939 Activity, unspecified: Secondary | ICD-10-CM | POA: Insufficient documentation

## 2013-02-27 DIAGNOSIS — Y92009 Unspecified place in unspecified non-institutional (private) residence as the place of occurrence of the external cause: Secondary | ICD-10-CM | POA: Insufficient documentation

## 2013-02-27 DIAGNOSIS — W19XXXA Unspecified fall, initial encounter: Secondary | ICD-10-CM

## 2013-02-27 DIAGNOSIS — F17200 Nicotine dependence, unspecified, uncomplicated: Secondary | ICD-10-CM | POA: Insufficient documentation

## 2013-02-27 DIAGNOSIS — I69959 Hemiplegia and hemiparesis following unspecified cerebrovascular disease affecting unspecified side: Secondary | ICD-10-CM | POA: Insufficient documentation

## 2013-02-27 DIAGNOSIS — I10 Essential (primary) hypertension: Secondary | ICD-10-CM | POA: Insufficient documentation

## 2013-02-27 LAB — BASIC METABOLIC PANEL
CO2: 27 mEq/L (ref 19–32)
Glucose, Bld: 100 mg/dL — ABNORMAL HIGH (ref 70–99)
Potassium: 4.4 mEq/L (ref 3.5–5.1)
Sodium: 140 mEq/L (ref 135–145)

## 2013-02-27 LAB — CBC WITH DIFFERENTIAL/PLATELET
Eosinophils Absolute: 0.5 10*3/uL (ref 0.0–0.7)
Eosinophils Relative: 6 % — ABNORMAL HIGH (ref 0–5)
HCT: 40.9 % (ref 39.0–52.0)
Hemoglobin: 13.3 g/dL (ref 13.0–17.0)
Lymphs Abs: 2.4 10*3/uL (ref 0.7–4.0)
MCH: 29.8 pg (ref 26.0–34.0)
MCV: 91.5 fL (ref 78.0–100.0)
Monocytes Absolute: 0.6 10*3/uL (ref 0.1–1.0)
Monocytes Relative: 7 % (ref 3–12)
RBC: 4.47 MIL/uL (ref 4.22–5.81)

## 2013-02-27 NOTE — ED Notes (Signed)
Pt sitting up on end of bed completely dressed. Requesting to see MD again. Dr. Lacinda Axon made aware for second time via telephone and states that he will be in to see pt. Pt and family made aware.

## 2013-02-27 NOTE — ED Notes (Signed)
[id number] Expected date:<BR> Expected time:<BR> Means of arrival:<BR> Comments:<BR> ems

## 2013-02-27 NOTE — ED Notes (Signed)
MD at bedside to re eval pt.

## 2013-02-27 NOTE — ED Provider Notes (Signed)
History     CSN: [id number]  Arrival date & time 02/27/13  1507   First MD Initiated Contact with Patient 02/27/13 1508      Chief Complaint  Patient presents with  . Fall    (Consider location/radiation/quality/duration/timing/severity/associated sxs/prior treatment) HPI... status post accidental fall earlier today.  Patient has had a stroke approximately 4 years ago resulting in left-sided deficits and ataxia.  He does have trouble walking.  Patient and wife report no new neurological deficits.  He is not confused. No slurred speech. No facial drooping. Nothing makes symptoms better or worse. Severity is mild to moderate. No chest pain, dyspnea, fever, chills   Past Medical History  Diagnosis Date  . Hypertension   . Stroke     History reviewed. No pertinent past surgical history.  History reviewed. No pertinent family history.  History  Substance Use Topics  . Smoking status: Current Every Day Smoker    Types: Cigarettes  . Smokeless tobacco: Not on file  . Alcohol Use: Yes      Review of Systems  All other systems reviewed and are negative.    Allergies  Atacand hct and Shellfish allergy  Home Medications   Current Outpatient Rx  Name  Route  Sig  Dispense  Refill  . aspirin EC 81 MG EC tablet   Oral   Take 1 tablet (81 mg total) by mouth daily.         . hydrALAZINE (APRESOLINE) 25 MG tablet   Oral   Take 50 mg by mouth 2 (two) times daily.         . isosorbide dinitrate (ISORDIL) 20 MG tablet   Oral   Take 20 mg by mouth 2 (two) times daily.         Marland Kitchen lisinopril (PRINIVIL,ZESTRIL) 20 MG tablet   Oral   Take 1 tablet (20 mg total) by mouth daily.   30 tablet   1   . clotrimazole-betamethasone (LOTRISONE) cream   Topical   Apply topically 2 (two) times daily. Apply to affected skin areas.   30 g   0     BP 180/105  Pulse 88  Temp(Src) 98.4 F (36.9 C) (Oral)  Resp 18  SpO2 97%  Physical Exam  Nursing note and vitals  reviewed. Constitutional: He is oriented to person, place, and time. He appears well-developed and well-nourished.  HENT:  Head: Normocephalic and atraumatic.  Eyes: Conjunctivae and EOM are normal. Pupils are equal, round, and reactive to light.  Neck: Normal range of motion. Neck supple.  Cardiovascular: Normal rate, regular rhythm and normal heart sounds.   Pulmonary/Chest: Effort normal and breath sounds normal.  Abdominal: Soft. Bowel sounds are normal.  Musculoskeletal: Normal range of motion.  Neurological: He is alert and oriented to person, place, and time.  Left arm and leg weakness (old)  Skin: Skin is warm and dry.  Psychiatric: He has a normal mood and affect.    ED Course  Procedures (including critical care time)  Labs Reviewed  BASIC METABOLIC PANEL - Abnormal; Notable for the following:    Glucose, Bld 100 (*)    Creatinine, Ser 1.97 (*)    Calcium 10.6 (*)    GFR calc non Af Amer 35 (*)    GFR calc Af Amer 40 (*)    All other components within normal limits  CBC WITH DIFFERENTIAL - Abnormal; Notable for the following:    Eosinophils Relative 6 (*)    All other  components within normal limits  TROPONIN I    Ct Head Wo Contrast  02/27/2013   *RADIOLOGY REPORT*  Clinical Data: [place].  CT HEAD WITHOUT CONTRAST  Technique:  Contiguous axial images were obtained from the base of the skull through the vertex without contrast.  Comparison: 02/25/2012 and MR dated 10/09/2012.  Findings: Stable old lacunar infarcts in the white matter and thalamus on the right.  Stable enlarged ventricles and subarachnoid spaces.  Mildly progressive patchy white matter low density in both cerebral hemispheres.  No skull fracture, intracranial hemorrhage or paranasal sinus air-fluid levels.  Bilateral ethmoid and frontal sinus mucosal thickening.  IMPRESSION:  1.  No acute abnormality. 2.  Atrophy, chronic small vessel white matter ischemic changes and old lacunar infarcts. 3.  Mild chronic  bilateral ethmoid and maxillary sinusitis.   Original Report Authenticated By: Claudie Revering, M.D.   No results found.   No diagnosis found.   Date: 02/27/2013  Rate: 86  Rhythm: normal sinus rhythm  QRS Axis: normal  Intervals: normal  ST/T Wave abnormalities: normal  Conduction Disutrbances: PVC's  Narrative Interpretation: unremarkable     MDM  CT head shows no acute findings. Wife says patient is at baseline. Discussed elevated creatinine.  Patient will get primary care follow up        [name], MD 03/01/13 [phone]

## 2013-02-27 NOTE — ED Notes (Signed)
Returned from CT.

## 2013-02-27 NOTE — ED Notes (Signed)
Dr Lacinda Axon made aware that pt is ready to leave. States that he will be in to see pt shortly.

## 2013-02-27 NOTE — ED Notes (Addendum)
Per EMS: Pt fell against the dresser on left side, no deformities no discolorations, no loc 4/10 pain in route. Alert and oriented. Pt wife states pt is having difficulty swallowing food but does well with liquids and pt states he is having left side weakness after fall. Pt currently states he is not in pain.

## 2013-03-05 ENCOUNTER — Encounter (HOSPITAL_COMMUNITY): Payer: Self-pay | Admitting: *Deleted

## 2013-03-05 ENCOUNTER — Emergency Department (HOSPITAL_COMMUNITY)
Admission: EM | Admit: 2013-03-05 | Discharge: 2013-03-05 | Disposition: A | Discharge status: Home Health | Payer: Medicare Other | Attending: Emergency Medicine | Admitting: Emergency Medicine

## 2013-03-05 ENCOUNTER — Emergency Department (HOSPITAL_COMMUNITY): Payer: Medicare Other

## 2013-03-05 DIAGNOSIS — W19XXXA Unspecified fall, initial encounter: Secondary | ICD-10-CM

## 2013-03-05 DIAGNOSIS — S46909A Unspecified injury of unspecified muscle, fascia and tendon at shoulder and upper arm level, unspecified arm, initial encounter: Secondary | ICD-10-CM | POA: Insufficient documentation

## 2013-03-05 DIAGNOSIS — Z7982 Long term (current) use of aspirin: Secondary | ICD-10-CM | POA: Insufficient documentation

## 2013-03-05 DIAGNOSIS — I10 Essential (primary) hypertension: Secondary | ICD-10-CM | POA: Insufficient documentation

## 2013-03-05 DIAGNOSIS — R5381 Other malaise: Secondary | ICD-10-CM | POA: Insufficient documentation

## 2013-03-05 DIAGNOSIS — R42 Dizziness and giddiness: Secondary | ICD-10-CM | POA: Insufficient documentation

## 2013-03-05 DIAGNOSIS — Z8673 Personal history of transient ischemic attack (TIA), and cerebral infarction without residual deficits: Secondary | ICD-10-CM | POA: Insufficient documentation

## 2013-03-05 DIAGNOSIS — W06XXXA Fall from bed, initial encounter: Secondary | ICD-10-CM | POA: Insufficient documentation

## 2013-03-05 DIAGNOSIS — R5383 Other fatigue: Secondary | ICD-10-CM | POA: Insufficient documentation

## 2013-03-05 DIAGNOSIS — S4980XA Other specified injuries of shoulder and upper arm, unspecified arm, initial encounter: Secondary | ICD-10-CM | POA: Insufficient documentation

## 2013-03-05 DIAGNOSIS — F17200 Nicotine dependence, unspecified, uncomplicated: Secondary | ICD-10-CM | POA: Insufficient documentation

## 2013-03-05 DIAGNOSIS — Y939 Activity, unspecified: Secondary | ICD-10-CM | POA: Insufficient documentation

## 2013-03-05 DIAGNOSIS — Z79899 Other long term (current) drug therapy: Secondary | ICD-10-CM | POA: Insufficient documentation

## 2013-03-05 DIAGNOSIS — R9389 Abnormal findings on diagnostic imaging of other specified body structures: Secondary | ICD-10-CM | POA: Insufficient documentation

## 2013-03-05 DIAGNOSIS — Y92009 Unspecified place in unspecified non-institutional (private) residence as the place of occurrence of the external cause: Secondary | ICD-10-CM | POA: Insufficient documentation

## 2013-03-05 LAB — URINALYSIS, ROUTINE W REFLEX MICROSCOPIC
Bilirubin Urine: NEGATIVE
Nitrite: NEGATIVE
Specific Gravity, Urine: 1.021 (ref 1.005–1.030)
pH: 5.5 (ref 5.0–8.0)

## 2013-03-05 LAB — CBC WITH DIFFERENTIAL/PLATELET
Basophils Absolute: 0 10*3/uL (ref 0.0–0.1)
Basophils Relative: 1 % (ref 0–1)
Eosinophils Absolute: 0.3 10*3/uL (ref 0.0–0.7)
Eosinophils Relative: 3 % (ref 0–5)
HCT: 41.1 % (ref 39.0–52.0)
MCH: 31.3 pg (ref 26.0–34.0)
MCHC: 34.3 g/dL (ref 30.0–36.0)
MCV: 91.3 fL (ref 78.0–100.0)
Monocytes Absolute: 0.7 10*3/uL (ref 0.1–1.0)
Platelets: 237 10*3/uL (ref 150–400)
RDW: 13.3 % (ref 11.5–15.5)
WBC: 7.9 10*3/uL (ref 4.0–10.5)

## 2013-03-05 LAB — POCT I-STAT, CHEM 8
Chloride: 107 mEq/L (ref 96–112)
Glucose, Bld: 97 mg/dL (ref 70–99)
HCT: 43 % (ref 39.0–52.0)
Potassium: 4.2 mEq/L (ref 3.5–5.1)

## 2013-03-05 LAB — MAGNESIUM: Magnesium: 1.9 mg/dL (ref 1.5–2.5)

## 2013-03-05 LAB — URINE MICROSCOPIC-ADD ON

## 2013-03-05 LAB — CALCIUM, IONIZED: Calcium, Ion: 1.51 mmol/L — ABNORMAL HIGH (ref 1.13–1.30)

## 2013-03-05 MED ORDER — HYDRALAZINE HCL 20 MG/ML IJ SOLN
10.0000 mg | Freq: Once | INTRAMUSCULAR | Status: AC
  Administered 2013-03-05: 10 mg via INTRAVENOUS
  Filled 2013-03-05: qty 1

## 2013-03-05 MED ORDER — HYDROMORPHONE HCL PF 1 MG/ML IJ SOLN
1.0000 mg | Freq: Once | INTRAMUSCULAR | Status: AC
  Administered 2013-03-05: 1 mg via INTRAVENOUS
  Filled 2013-03-05: qty 1

## 2013-03-05 NOTE — ED Provider Notes (Signed)
History     CSN: [id number]  Arrival date & time 03/05/13  59   First MD Initiated Contact with Patient 03/05/13 1650      Chief Complaint  Patient presents with  . Fall    (Consider location/radiation/quality/duration/timing/severity/associated sxs/prior treatment) HPI Patient presents after a fall with pain in his left inferior posterior axilla. Pain is sore, worse with ambulation, or lifting his left leg. The patient denies head trauma during the fall. He also denies any new weakness unilaterally, though he has diffuse fatigue which seems to be increasing. He has a notable history of previously identified microvascular changes consistent with hypertension and alcohol abuse. He has recently had frequent falls, including 2 or 3 times weekly, most recently last week.  He was seen at [facility] long for that fall.  That evaluation included a CT scan consistent with progression of chronic disease. Patient is scheduled to see a neurologist in 2 weeks. He currently denies any nausea, lightheadedness, headache, neck pain, chest pain, dyspnea. No attempts at relief with anything for the pain.   Past Medical History  Diagnosis Date  . Hypertension   . Stroke     History reviewed. No pertinent past surgical history.  No family history on file.  History  Substance Use Topics  . Smoking status: Current Every Day Smoker    Types: Cigarettes  . Smokeless tobacco: Not on file  . Alcohol Use: Yes      Review of Systems  Constitutional:       Per HPI, otherwise negative  HENT:       Per HPI, otherwise negative  Respiratory:       Per HPI, otherwise negative  Cardiovascular:       Per HPI, otherwise negative  Gastrointestinal: Negative for vomiting.  Endocrine:       Negative aside from HPI  Genitourinary:       Neg aside from HPI   Musculoskeletal:       Per HPI, otherwise negative  Skin: Negative.   Neurological: Positive for weakness and light-headedness. Negative for  tremors, seizures, syncope, facial asymmetry, speech difficulty, numbness and headaches.    Allergies  Atacand hct and Shellfish allergy  Home Medications   Current Outpatient Rx  Name  Route  Sig  Dispense  Refill  . aspirin EC 81 MG EC tablet   Oral   Take 1 tablet (81 mg total) by mouth daily.         . clotrimazole-betamethasone (LOTRISONE) cream   Topical   Apply 1 application topically 2 (two) times daily as needed (rash).         . hydrALAZINE (APRESOLINE) 25 MG tablet   Oral   Take 25-50 mg by mouth 2 (two) times daily. 2 tablets in the morning and 1 tablet in the evening.         . isosorbide dinitrate (ISORDIL) 20 MG tablet   Oral   Take 20 mg by mouth 2 (two) times daily.         Marland Kitchen lisinopril (PRINIVIL,ZESTRIL) 20 MG tablet   Oral   Take 1 tablet (20 mg total) by mouth daily.   30 tablet   1     BP 198/141  Pulse 83  Temp(Src) 97.5 F (36.4 C) (Oral)  Resp 19  SpO2 99%  Physical Exam  Nursing note and vitals reviewed. Constitutional: He is oriented to person, place, and time. He appears well-developed and well-nourished.  HENT:  Head: Normocephalic and atraumatic.  Eyes: Conjunctivae and EOM are normal. Pupils are equal, round, and reactive to light.  Neck: Normal range of motion. Neck supple.  Cardiovascular: Normal rate, regular rhythm and normal heart sounds.   Pulmonary/Chest: Effort normal and breath sounds normal.  Abdominal: Soft. Bowel sounds are normal.  Musculoskeletal: Normal range of motion.       Arms: Neurological: He is alert and oriented to person, place, and time.  Left arm and leg weakness (old)  Skin: Skin is warm and dry.  Psychiatric: He has a normal mood and affect.    ED Course  Procedures (including critical care time)  Labs Reviewed  URINALYSIS, ROUTINE W REFLEX MICROSCOPIC - Abnormal; Notable for the following:    Protein, ur 30 (*)    Leukocytes, UA SMALL (*)    All other components within normal limits    URINE MICROSCOPIC-ADD ON - Abnormal; Notable for the following:    Bacteria, UA FEW (*)    Casts HYALINE CASTS (*)    All other components within normal limits  POCT I-STAT, CHEM 8 - Abnormal; Notable for the following:    Creatinine, Ser 1.70 (*)    Calcium, Ion 1.44 (*)    All other components within normal limits  URINE CULTURE  CBC WITH DIFFERENTIAL  MAGNESIUM  CALCIUM, IONIZED   No results found.   No diagnosis found.  Oxygen 99% room normal   During my exam the patient is notably hypertensive with diastolics greater than [id number].  Hydralazine provided.  7:22 PM On re-exam the patient is much improved.  CT ordered, per rad's report  9:07 PM CT results discussed with the patient.  He states that he is hungry.  We discussed the need for continued evaluation with his neurologist, and the patient's wife will call tomorrow to arrange a sooner appointment.  Additionally, the patient will follow up with his primary care physician, resources provided. Home health resources prescribed as well.   MDM  Patient with hypertension, prior stroke presents after a fall.  Notably, the patient has multiple falls recently, and is currently being evaluated with his neurologist.  Today's evaluation was largely reassuring, though there was demonstration of progression of chronic microscopic changes consistent with persistent hypertension.  Patient improved here with analgesics, fluids.  After lengthy discussion return precautions fold injections he was discharged in stable condition.        Carmin Muskrat, MD 03/05/13 2108

## 2013-03-05 NOTE — ED Notes (Signed)
Pt states he fell out of the bed and hurt his left lateral abdominal area.  Pt also fell on Monday and was seen at [facility] and did have CT scan.  Pt has had previous stroke and left him with left sided weakness, wife states his left sided weakness has been worse over the last couple of weeks.

## 2013-03-05 NOTE — ED Notes (Signed)
Family at bedside. 

## 2013-03-08 LAB — URINE CULTURE: Colony Count: >=100000

## 2013-03-09 ENCOUNTER — Telehealth (HOSPITAL_COMMUNITY): Payer: Self-pay | Admitting: Emergency Medicine

## 2013-03-09 NOTE — Progress Notes (Addendum)
ED Antimicrobial Stewardship Positive Culture Follow Up   Darrell [name] is an 62 y.o. male who presented to [facility] on 03/05/2013 with a chief complaint of fall.   Chief Complaint  Patient presents with  . Fall    Recent Results (from the past 720 hour(s))  URINE CULTURE     Status: None   Collection Time    03/05/13  5:20 PM      Result Value Range Status   Specimen Description URINE, CLEAN CATCH   Final   Special Requests NONE   Final   Culture  Setup Time 03/06/2013 10:32   Final   Colony Count >=100,000 COLONIES/ML   Final   Culture     Final   Value: STAPHYLOCOCCUS SPECIES (COAGULASE NEGATIVE)     Note: RIFAMPIN AND GENTAMICIN SHOULD NOT BE USED AS SINGLE DRUGS FOR TREATMENT OF STAPH INFECTIONS.   Report Status 03/08/2013 FINAL   Final   Organism ID, Bacteria STAPHYLOCOCCUS SPECIES (COAGULASE NEGATIVE)   Final    []  Treated with, organism resistant to prescribed antimicrobial [x]  Patient discharged originally without antimicrobial agent and treatment is now indicated  New antibiotic prescription: Levaquin 750 mg PO every 48 hours for 10 days  ED Provider: Clayton Bibles, PA-C  Darrell [name] 03/09/2013, 9:14 AM Infectious Diseases Pharmacist Phone# [phone]

## 2013-03-09 NOTE — ED Notes (Signed)
Post ED Visit - Positive Culture Follow-up: Successful Patient Follow-Up  Culture assessed and recommendations reviewed by: []  Wes Dulaney, Pharm.D., BCPS []  Heide Guile, Pharm.D., BCPS []  [name], Pharm.D., BCPS []  [place], Pharm.D., BCPS, AAHIVP []  Legrand Como, Pharm.D., BCPS, AAHIVP [x]  Narda Bonds, Pharm.D., BCPS  Positive urine culture  [x]  Patient discharged without antimicrobial prescription and treatment is now indicated []  Organism is resistant to prescribed ED discharge antimicrobial []  Patient with positive blood cultures  Changes discussed with ED provider: Clayton Bibles PA-C New antibiotic prescription: Levaquin 750 mg q48 po x 10 days   Kylie A Holland 03/09/2013, 10:40 AM

## 2013-03-10 ENCOUNTER — Telehealth (HOSPITAL_COMMUNITY): Payer: Self-pay | Admitting: Emergency Medicine

## 2013-03-10 NOTE — ED Notes (Signed)
Patient informed of positive results- rx called to [facility] by Ulis Rias P[facility]

## 2013-03-11 ENCOUNTER — Emergency Department (HOSPITAL_COMMUNITY): Payer: Medicare Other

## 2013-03-11 ENCOUNTER — Encounter (HOSPITAL_COMMUNITY): Payer: Self-pay | Admitting: Emergency Medicine

## 2013-03-11 ENCOUNTER — Emergency Department (HOSPITAL_COMMUNITY)
Admission: EM | Admit: 2013-03-11 | Discharge: 2013-03-11 | Disposition: A | Discharge status: Home / Self Care | Payer: Medicare Other | Attending: Emergency Medicine | Admitting: Emergency Medicine

## 2013-03-11 DIAGNOSIS — R6889 Other general symptoms and signs: Secondary | ICD-10-CM | POA: Insufficient documentation

## 2013-03-11 DIAGNOSIS — K469 Unspecified abdominal hernia without obstruction or gangrene: Secondary | ICD-10-CM | POA: Insufficient documentation

## 2013-03-11 DIAGNOSIS — K222 Esophageal obstruction: Secondary | ICD-10-CM

## 2013-03-11 DIAGNOSIS — Z7982 Long term (current) use of aspirin: Secondary | ICD-10-CM | POA: Insufficient documentation

## 2013-03-11 DIAGNOSIS — I10 Essential (primary) hypertension: Secondary | ICD-10-CM | POA: Insufficient documentation

## 2013-03-11 DIAGNOSIS — Z79899 Other long term (current) drug therapy: Secondary | ICD-10-CM | POA: Insufficient documentation

## 2013-03-11 DIAGNOSIS — R5381 Other malaise: Secondary | ICD-10-CM | POA: Insufficient documentation

## 2013-03-11 DIAGNOSIS — G8190 Hemiplegia, unspecified affecting unspecified side: Secondary | ICD-10-CM | POA: Insufficient documentation

## 2013-03-11 DIAGNOSIS — R059 Cough, unspecified: Secondary | ICD-10-CM | POA: Insufficient documentation

## 2013-03-11 DIAGNOSIS — F17200 Nicotine dependence, unspecified, uncomplicated: Secondary | ICD-10-CM | POA: Insufficient documentation

## 2013-03-11 DIAGNOSIS — R05 Cough: Secondary | ICD-10-CM | POA: Insufficient documentation

## 2013-03-11 DIAGNOSIS — K449 Diaphragmatic hernia without obstruction or gangrene: Secondary | ICD-10-CM

## 2013-03-11 DIAGNOSIS — R1310 Dysphagia, unspecified: Secondary | ICD-10-CM | POA: Insufficient documentation

## 2013-03-11 DIAGNOSIS — Z8673 Personal history of transient ischemic attack (TIA), and cerebral infarction without residual deficits: Secondary | ICD-10-CM | POA: Insufficient documentation

## 2013-03-11 LAB — CBC WITH DIFFERENTIAL/PLATELET
Basophils Relative: 0 % (ref 0–1)
Eosinophils Absolute: 0.2 10*3/uL (ref 0.0–0.7)
Eosinophils Relative: 2 % (ref 0–5)
Lymphs Abs: 2.1 10*3/uL (ref 0.7–4.0)
MCH: 31 pg (ref 26.0–34.0)
MCHC: 34 g/dL (ref 30.0–36.0)
MCV: 91.2 fL (ref 78.0–100.0)
Platelets: 246 10*3/uL (ref 150–400)
RBC: 4.68 MIL/uL (ref 4.22–5.81)

## 2013-03-11 LAB — POCT I-STAT, CHEM 8
Creatinine, Ser: 1.7 mg/dL — ABNORMAL HIGH (ref 0.50–1.35)
Glucose, Bld: 88 mg/dL (ref 70–99)
HCT: 45 % (ref 39.0–52.0)
Hemoglobin: 15.3 g/dL (ref 13.0–17.0)
Sodium: 145 mEq/L (ref 135–145)
TCO2: 25 mmol/L (ref 0–100)

## 2013-03-11 MED ORDER — LABETALOL HCL 5 MG/ML IV SOLN
20.0000 mg | Freq: Once | INTRAVENOUS | Status: AC
  Administered 2013-03-11: 20 mg via INTRAVENOUS
  Filled 2013-03-11: qty 4

## 2013-03-11 MED ORDER — IOHEXOL 300 MG/ML  SOLN: 150.0000 mL | Freq: Once | INTRAMUSCULAR | Status: DC | PRN

## 2013-03-11 MED ORDER — SODIUM CHLORIDE 0.9 % IV SOLN
Freq: Once | INTRAVENOUS | Status: AC
  Administered 2013-03-11: 17:00:00 via INTRAVENOUS

## 2013-03-11 MED ORDER — SODIUM CHLORIDE 0.9 % IV BOLUS (SEPSIS)
700.0000 mL | Freq: Once | INTRAVENOUS | Status: AC
  Administered 2013-03-11: 700 mL via INTRAVENOUS

## 2013-03-11 MED ORDER — NITROFURANTOIN MONOHYD MACRO 100 MG PO CAPS: 100.0000 mg | ORAL_CAPSULE | Freq: Two times a day (BID) | ORAL | Status: DC

## 2013-03-11 NOTE — ED Notes (Signed)
Wife stated, he's been having trouble swallowing for the last 3 weeks. Here last week cause he fell.

## 2013-03-11 NOTE — ED Notes (Signed)
Every time he eats he gets strangled.

## 2013-03-11 NOTE — ED Provider Notes (Addendum)
History     CSN: [id number]  Arrival date & time 03/11/13  5   First MD Initiated Contact with Patient 03/11/13 1436      Chief Complaint  Patient presents with  . Dysphagia    (Consider location/radiation/quality/duration/timing/severity/associated sxs/prior treatment) HPI Patient presents to the emergency department his wife. They report he had a stroke in 2011 and has left-sided weakness. He started having swallowing difficulty 6 months ago but has gotten progressively worse over the past 2-3 weeks. Wife reports he has choking with even soft foods such as applesauce and now even water. He states he feels like the food gets stuck and indicates his lower chest and then he has to regurgitate it because he feels like it will not pass. He also has had a cough for the past couple weeks without fever. He denies any pain. He denies having GERD in the past. Wife states he may have lost a couple of pounds. He has had falling since he had the stroke however she reports is getting a little bit worse recently and he's had 2 prior ED visits last week for falling. She states she's never had endoscopy done in the past and he has his first GI appointment this week.   [facility] first appt in 2 days Gastroenterologist Dr Paulita Fujita first appt in 2 days  Past Medical History  Diagnosis Date  . Hypertension   . Stroke     History reviewed. No pertinent past surgical history.  No family history on file.  History  Substance Use Topics  . Smoking status: Current Every Day Smoker    Types: Cigarettes  . Smokeless tobacco: Not on file  . Alcohol Use: Yes  lives at home Lives with wife    Review of Systems  All other systems reviewed and are negative.    Allergies  Atacand hct and Shellfish allergy  Home Medications   Current Outpatient Rx  Name  Route  Sig  Dispense  Refill  . aspirin EC 81 MG EC tablet   Oral   Take 1 tablet (81 mg  total) by mouth daily.         . clotrimazole-betamethasone (LOTRISONE) cream   Topical   Apply 1 application topically 2 (two) times daily as needed (rash).         . hydrALAZINE (APRESOLINE) 25 MG tablet   Oral   Take 25-50 mg by mouth 2 (two) times daily. 2 tablets in the morning and 1 tablet in the evening.         . isosorbide dinitrate (ISORDIL) 20 MG tablet   Oral   Take 20 mg by mouth 2 (two) times daily.         Marland Kitchen lisinopril (PRINIVIL,ZESTRIL) 20 MG tablet   Oral   Take 1 tablet (20 mg total) by mouth daily.   30 tablet   1     BP 200/119  Pulse 79  Temp(Src) 98.7 F (37.1 C) (Oral)  Resp 17  SpO2 98%  Vital signs normal except for hypertension   Physical Exam  Nursing note and vitals reviewed. Constitutional: He is oriented to person, place, and time. He appears well-developed and well-nourished.  Non-toxic appearance. He does not appear ill. No distress.  HENT:  Head: Normocephalic and atraumatic.  Right Ear: External ear normal.  Left Ear: External ear normal.  Nose: Nose normal. No mucosal edema or rhinorrhea.  Mouth/Throat: Oropharynx is clear and moist  and mucous membranes are normal. No dental abscesses or edematous.  Eyes: Conjunctivae and EOM are normal. Pupils are equal, round, and reactive to light.  Neck: Normal range of motion and full passive range of motion without pain. Neck supple.  Cardiovascular: Normal rate, regular rhythm and normal heart sounds.  Exam reveals no gallop and no friction rub.   No murmur heard. Pulmonary/Chest: Effort normal and breath sounds normal. No respiratory distress. He has no wheezes. He has no rhonchi. He has no rales. He exhibits no tenderness and no crepitus.  Abdominal: Soft. Normal appearance and bowel sounds are normal. He exhibits no distension. There is no tenderness. There is no rebound and no guarding.  Musculoskeletal: Normal range of motion. He exhibits no edema and no tenderness.  Neurological:  He is alert and oriented to person, place, and time. He has normal strength. No cranial nerve deficit.  Left sided hemiparesis  Skin: Skin is warm, dry and intact. No rash noted. No erythema. No pallor.  Psychiatric: He has a normal mood and affect. His speech is normal and behavior is normal. His mood appears not anxious.    ED Course  Procedures (including critical care time)  Medications  labetalol (NORMODYNE,TRANDATE) injection 20 mg (not administered)  sodium chloride 0.9 % bolus 700 mL (0 mLs Intravenous Stopped 03/11/13 1633)  0.9 %  sodium chloride infusion ( Intravenous New Bag/Given 03/11/13 1649)   15:14 Dr Michail Sermon, states to do Barium (Gastrograffin) Swallow first and call back if needed.   18:25 Dr Amedeo Plenty, will leave a message for Dr Paulita Fujita, have him avoid solid foods, he can have ice cream, ensure, have him be NPO after MN tonight incase they can schedule him for dilatation tomorrow.   Pt and wife know the results and the plan, she states ice cream in about the only thing he can eat. Pt did not take him morning medications today, his BP was treated with labetalol.   Pt was prescribed levaquin for a coagulase negative UTI 100K colonies, sensitive to TCN, PCN and macrodantin. Will try macrodantin may not be best antibiotic but it is on the $4 list at [facility] that they requested.   Results for orders placed during the hospital encounter of 03/11/13  CBC WITH DIFFERENTIAL      Result Value Range   WBC 9.0  4.0 - 10.5 K/uL   RBC 4.68  4.22 - 5.81 MIL/uL   Hemoglobin 14.5  13.0 - 17.0 g/dL   HCT 42.7  39.0 - 52.0 %   MCV 91.2  78.0 - 100.0 fL   MCH 31.0  26.0 - 34.0 pg   MCHC 34.0  30.0 - 36.0 g/dL   RDW 13.5  11.5 - 15.5 %   Platelets 246  150 - 400 K/uL   Neutrophils Relative % 68  43 - 77 %   Neutro Abs 6.1  1.7 - 7.7 K/uL   Lymphocytes Relative 23  12 - 46 %   Lymphs Abs 2.1  0.7 - 4.0 K/uL   Monocytes Relative 7  3 - 12 %   Monocytes Absolute 0.6  0.1 - 1.0 K/uL    Eosinophils Relative 2  0 - 5 %   Eosinophils Absolute 0.2  0.0 - 0.7 K/uL   Basophils Relative 0  0 - 1 %   Basophils Absolute 0.0  0.0 - 0.1 K/uL  POCT I-STAT, CHEM 8      Result Value Range   Sodium 145  135 -  145 mEq/L   Potassium 4.6  3.5 - 5.1 mEq/L   Chloride 110  96 - 112 mEq/L   BUN 20  6 - 23 mg/dL   Creatinine, Ser 1.70 (*) 0.50 - 1.35 mg/dL   Glucose, Bld 88  70 - 99 mg/dL   Calcium, Ion 1.42 (*) 1.13 - 1.30 mmol/L   TCO2 25  0 - 100 mmol/L   Hemoglobin 15.3  13.0 - 17.0 g/dL   HCT 45.0  39.0 - 52.0 %   Laboratory interpretation all normal except stable renal insufficiency  Dg Esophagus  03/11/2013   *RADIOLOGY REPORT*  Clinical Data:Dysphagia.  ESOPHAGUS/BARIUM SWALLOW/TABLET STUDY  Fluoroscopy Time: 1 minute and 24 seconds.  Comparison: .  03/05/2013 CT  Findings: Laryngeal penetration without aspiration.  Impression upon the posterior aspect of the cervical esophagus by anterior osteophyte at the C5-6 level.  Presbyesophagus.  No obstructing lesion.  Small sliding type hiatal hernia.  Smooth focal moderate stricture at distal esophagus just above the hiatal hernia.  The patient did not ingest a barium tablet.  No reflux with change in patient position.  IMPRESSION: Laryngeal penetration without aspiration.  Presbyesophagus.  No esophageal obstructing lesion.  Small sliding type hiatal hernia.  Smooth focal moderate stricture at distal esophagus just above the hiatal hernia.  The patient did not ingest a barium tablet.   Original Report Authenticated By: Genia Del, M.D.   Dg Chest Portable 1 View  03/11/2013   *RADIOLOGY REPORT*  Clinical Data: Smoker with dysphagia.  History of hypertension.  PORTABLE CHEST - 1 VIEW  Comparison: Chest radiograph and chest CT dated 03/05/2013.  Findings: Normal sized heart with a mild interval decrease in size. Clear lungs.  Stable left first costal cartilage calcification. Mild scoliosis.  Right shoulder degenerative changes.  IMPRESSION: No  acute abnormality.   Original Report Authenticated By: Claudie Revering, M.D.    Dg Ribs Unilateral W/chest Left  03/05/2013 .  IMPRESSION: More prominent left perihilar opacity near the aortic knob with suggestion of soft tissue fullness at the level of the AP window. Underlying neoplasm is not excluded.  This could also represent a subtle infiltrate.  Recommend correlation with CT of the chest.   Original Report Authenticated By: Aletta Edouard, M.D.   Ct Head Wo Contrast  02/27/2013  IMPRESSION:  1.  No acute abnormality. 2.  Atrophy, chronic small vessel white matter ischemic changes and old lacunar infarcts. 3.  Mild chronic bilateral ethmoid and maxillary sinusitis.   Original Report Authenticated By: Claudie Revering, M.D.   Ct Chest Wo Contrast  03/05/2013    IMPRESSION: No pulmonary masses identified.  Radiographic abnormality corresponds to a prominent osteophyte at the left first costochondral junction.  Isolated borderline left axillary lymph node identified without other evidence of lymphadenopathy in the chest.   Original Report Authenticated By: Aletta Edouard, M.D.      1. Dysphagia   2. Esophageal stricture   3. Hiatal hernia     Plan discharge  Rolland Porter, MD, [place], MD 03/11/13 [id number]  Janice Norrie, MD 03/11/13 1911

## 2013-03-13 ENCOUNTER — Inpatient Hospital Stay: Payer: Medicare Other

## 2013-03-13 ENCOUNTER — Telehealth: Payer: Self-pay | Admitting: Diagnostic Neuroimaging

## 2013-03-13 NOTE — Telephone Encounter (Signed)
Calling to reschedule patient's appt with Dr. Naida Sleight reschedule for July 11, 14, 15 or 16 AM slots

## 2013-03-16 ENCOUNTER — Telehealth (HOSPITAL_COMMUNITY): Payer: Self-pay | Admitting: Emergency Medicine

## 2013-03-16 NOTE — ED Notes (Signed)
Chart reviewed by N. Pisciotta PA "Cipro 500 mg  Sig: 1 tab po BID x 14 days"  Rx called to [facility].

## 2013-03-21 ENCOUNTER — Ambulatory Visit: Payer: Self-pay | Admitting: Diagnostic Neuroimaging

## 2013-03-25 ENCOUNTER — Encounter: Payer: Self-pay | Admitting: Family Medicine

## 2013-03-25 ENCOUNTER — Ambulatory Visit: Payer: Medicare Other | Attending: Family Medicine | Admitting: Family Medicine

## 2013-03-25 ENCOUNTER — Other Ambulatory Visit: Payer: Self-pay | Admitting: Family Medicine

## 2013-03-25 VITALS — BP 172/96 | HR 85 | Temp 98.4°F | Ht 75.0 in | Wt 175.0 lb

## 2013-03-25 DIAGNOSIS — I10 Essential (primary) hypertension: Secondary | ICD-10-CM

## 2013-03-25 DIAGNOSIS — N390 Urinary tract infection, site not specified: Secondary | ICD-10-CM

## 2013-03-25 DIAGNOSIS — K4090 Unilateral inguinal hernia, without obstruction or gangrene, not specified as recurrent: Secondary | ICD-10-CM

## 2013-03-25 DIAGNOSIS — I679 Cerebrovascular disease, unspecified: Secondary | ICD-10-CM

## 2013-03-25 DIAGNOSIS — I6930 Unspecified sequelae of cerebral infarction: Secondary | ICD-10-CM

## 2013-03-25 HISTORY — DX: Unilateral inguinal hernia, without obstruction or gangrene, not specified as recurrent: K40.90

## 2013-03-25 HISTORY — DX: Unspecified sequelae of cerebral infarction: I69.30

## 2013-03-25 LAB — LIPID PANEL
Cholesterol: 137 mg/dL (ref 0–200)
VLDL: 18 mg/dL (ref 0–40)

## 2013-03-25 LAB — COMPREHENSIVE METABOLIC PANEL
ALT: 9 U/L (ref 0–53)
CO2: 28 mEq/L (ref 19–32)
Calcium: 10.4 mg/dL (ref 8.4–10.5)
Chloride: 106 mEq/L (ref 96–112)
Sodium: 141 mEq/L (ref 135–145)
Total Protein: 6.6 g/dL (ref 6.0–8.3)

## 2013-03-25 LAB — POCT URINALYSIS DIPSTICK
Glucose, UA: NEGATIVE
Ketones, UA: NEGATIVE
Spec Grav, UA: 1.02
Urobilinogen, UA: 0.2

## 2013-03-25 MED ORDER — HYDRALAZINE HCL 25 MG PO TABS: 50.0000 mg | ORAL_TABLET | Freq: Two times a day (BID) | ORAL | Status: DC

## 2013-03-25 MED ORDER — LISINOPRIL 40 MG PO TABS: 40.0000 mg | ORAL_TABLET | Freq: Every day | ORAL | Status: DC

## 2013-03-25 MED ORDER — HYDROCHLOROTHIAZIDE 12.5 MG PO TABS: 12.5000 mg | ORAL_TABLET | Freq: Every day | ORAL | Status: DC

## 2013-03-25 NOTE — Patient Instructions (Addendum)

## 2013-03-25 NOTE — Progress Notes (Signed)
Patient ID: Darrell [name], male   DOB: 1951/09/21, 62 y.o.   MRN: [id number]  CC: establish  HPI: PT has been having a hard time with his blood pressure.  It is still not well controlled.  He had a stroke 4 years ago.  He has an appt to see a neurologist later this month.  He has been otherwise recently treated for esophageal strictures with dilatation by [facility] recently.  Pt has no complaints at this time.   Allergies  Allergen Reactions  . Atacand Hct (Candesartan Cilexetil-Hctz) Hives  . Shellfish Allergy Hives   Past Medical History  Diagnosis Date  . Hypertension   . Stroke    Current Outpatient Prescriptions on File Prior to Visit  Medication Sig Dispense Refill  . aspirin EC 81 MG EC tablet Take 1 tablet (81 mg total) by mouth daily.      . hydrALAZINE (APRESOLINE) 25 MG tablet Take 37.5 mg by mouth 2 (two) times daily.       . isosorbide dinitrate (ISORDIL) 20 MG tablet Take 20 mg by mouth 2 (two) times daily.      Marland Kitchen lisinopril (PRINIVIL,ZESTRIL) 20 MG tablet Take 1 tablet (20 mg total) by mouth daily.  30 tablet  1  . nitrofurantoin, macrocrystal-monohydrate, (MACROBID) 100 MG capsule Take 1 capsule (100 mg total) by mouth 2 (two) times daily.  28 capsule  0   No current facility-administered medications on file prior to visit.   Family History  Problem Relation Age of Onset  . Hypertension Mother   . Diabetes Sister   . Hypertension Sister    History   Social History  . Marital Status: Married    Spouse Name: N/A    Number of Children: N/A  . Years of Education: N/A   Occupational History  . Not on file.   Social History Main Topics  . Smoking status: Current Every Day Smoker -- 1.00 packs/day    Types: Cigarettes  . Smokeless tobacco: Not on file  . Alcohol Use: Yes  . Drug Use: No  . Sexually Active: Not on file   Other Topics Concern  . Not on file   Social History Narrative  . No narrative on file    Review of Systems  Constitutional: Negative  for fever, chills, diaphoresis, activity change, appetite change and fatigue.  HENT: Negative for ear pain, nosebleeds, congestion, facial swelling, rhinorrhea, neck pain, neck stiffness and ear discharge.   Eyes: Negative for pain, discharge, redness, itching and visual disturbance.  Respiratory: Negative for cough, choking, chest tightness, shortness of breath, wheezing and stridor.   Cardiovascular: Negative for chest pain, palpitations and leg swelling.  Gastrointestinal: Negative for abdominal distention.  Genitourinary: Negative for dysuria, urgency, frequency, hematuria, flank pain, decreased urine volume, difficulty urinating and dyspareunia.  Musculoskeletal: Negative for back pain, joint swelling, arthralgias and gait problem.  Neurological: Negative for dizziness, tremors, seizures, syncope, facial asymmetry, speech difficulty, weakness, light-headedness, numbness and headaches.  Hematological: Negative for adenopathy. Does not bruise/bleed easily.  Psychiatric/Behavioral: Negative for hallucinations, behavioral problems, confusion, dysphoric mood, decreased concentration and agitation.    Objective:   Filed Vitals:   03/25/13 1149  BP: 172/96  Pulse: 85  Temp: 98.4 F (36.9 C)    Physical Exam  Constitutional: Appears well-developed and well-nourished. No distress.  HENT: Normocephalic. External right and left ear normal. Oropharynx is clear and moist.  Eyes: Conjunctivae and EOM are normal. PERRLA, no scleral icterus.  Neck: Normal ROM.  Neck supple. No JVD. No tracheal deviation. No thyromegaly.  CVS: RRR, S1/S2 +, no murmurs, no gallops, no carotid bruit.  Pulmonary: Effort and breath sounds normal, no stridor, rhonchi, wheezes, rales.  Abdominal: Soft. BS +,  no distension, tenderness, rebound or guarding. large inguinal hernia, nonincarcerated noted.  Musculoskeletal: Normal range of motion. No edema and no tenderness.  Lymphadenopathy: No lymphadenopathy noted,  cervical, inguinal. Neuro: Alert. Normal reflexes, muscle tone coordination. No cranial nerve deficit. Skin: Skin is warm and dry. No rash noted. Not diaphoretic. No erythema. No pallor.  Psychiatric: Normal mood and affect. Behavior, judgment, thought content normal.   Lab Results  Component Value Date   WBC 9.0 03/11/2013   HGB 15.3 03/11/2013   HCT 45.0 03/11/2013   MCV 91.2 03/11/2013   PLT 246 03/11/2013   Lab Results  Component Value Date   CREATININE 1.70* 03/11/2013   BUN 20 03/11/2013   NA 145 03/11/2013   K 4.6 03/11/2013   CL 110 03/11/2013   CO2 27 02/27/2013    No results found for this basename: HGBA1C   Lipid Panel  No results found for this basename: chol, trig, hdl, cholhdl, vldl, ldlcalc       Assessment and plan:   Patient Active Problem List   Diagnosis Date Noted  . Syncope and collapse 10/09/2012  . HTN (hypertension) 10/09/2012  . ETOH abuse 10/09/2012  . Tobacco abuse 10/09/2012  . Rash 10/09/2012  . Dehydration 10/09/2012  . ARF (acute renal failure) 10/09/2012   Check CMP, lipids today Increase lisinopril to 40 mg po daily Add HCT 12.5 mg po daily Continue hydralazine 25 mg - take 2 po BID  Follow lab results Follow up with neurologist later this month  RTC in 1 months for BP check  The patient was given clear instructions to go to ER or return to medical center if symptoms don't improve, worsen or new problems develop.  The patient verbalized understanding.  The patient was told to call to get any lab results if not heard anything in the next week.    Gerlene Fee, MD, CDE, [place], [name]

## 2013-03-25 NOTE — Addendum Note (Signed)
Addended by: [place] Pink on: 03/25/2013 12:38 PM   Modules accepted: Orders

## 2013-03-26 NOTE — Progress Notes (Signed)
Quick Note:  Please inform patient that his labs came back and they revealed that his kidney function was abnormal but seem to be stable from previous tests. There was some protein leaking in the urine. Also the cholesterol levels came back okay. Have patient come in to recheck a urine dip and culture next week.   Gerlene Fee, MD, CDE, [place], [name]   ______

## 2013-04-03 ENCOUNTER — Encounter: Payer: Self-pay | Admitting: Diagnostic Neuroimaging

## 2013-04-03 ENCOUNTER — Ambulatory Visit (INDEPENDENT_AMBULATORY_CARE_PROVIDER_SITE_OTHER): Payer: Medicare Other | Admitting: Diagnostic Neuroimaging

## 2013-04-03 VITALS — BP 150/90 | HR 92 | Ht 75.0 in | Wt 176.0 lb

## 2013-04-03 DIAGNOSIS — I6350 Cerebral infarction due to unspecified occlusion or stenosis of unspecified cerebral artery: Secondary | ICD-10-CM

## 2013-04-03 DIAGNOSIS — I639 Cerebral infarction, unspecified: Secondary | ICD-10-CM

## 2013-04-03 DIAGNOSIS — R471 Dysarthria and anarthria: Secondary | ICD-10-CM

## 2013-04-03 DIAGNOSIS — R269 Unspecified abnormalities of gait and mobility: Secondary | ICD-10-CM

## 2013-04-03 NOTE — Progress Notes (Signed)
[facility]  PATIENT: Darrell [name] DOB: 10/13/1950  REFERRING CLINICIAN: Vanita Panda HISTORY FROM: patient and wife REASON FOR VISIT: new consult   HISTORICAL  CHIEF COMPLAINT:  Chief Complaint  Patient presents with  . Neurologic Problem    falls   NP#6    HISTORY OF PRESENT ILLNESS:   62 year old right-handed male with hypertension, hypercholesterolemia, smoking, alcohol abuse, here for evaluation of 8 and balance difficulty for past one year.  2011 patient had left arm and left leg weakness, diagnosed with stroke. Apparently he was admitted to hospital and treated. He had fairly good recovery initial one year after stroke. He required using a walking stick but otherwise was fairly mobile. Unfortunately he did not have good medical followup at that time. Patient was abusing alcohol significantly at that time, ranging from 1 pint up to a fifth of alcohol per day. In the past one to 2 years he has cut down on his drinking, now averaging 1 pint of alcohol per week.  In the past one year, patient's mobility is significantly deteriorated. He also has increasing slurred speech, trouble swallowing, balance and gait difficulty. He continues to fall down. Symptoms are worse when he drinks alcohol. Nowadays he is unable to stand up and walk across a room without assistance. This is been the case for the past one year.  REVIEW OF SYSTEMS: Full 14 system review of systems performed and notable only for weight loss fatigue cough snoring occupation urination problems disinterest in activities weakness slurred speech snoring memory loss.  ALLERGIES: Allergies  Allergen Reactions  . Atacand Hct (Candesartan Cilexetil-Hctz) Hives  . Shellfish Allergy Hives    HOME MEDICATIONS: Outpatient Prescriptions Prior to Visit  Medication Sig Dispense Refill  . aspirin EC 81 MG EC tablet Take 1 tablet (81 mg total) by mouth daily.      . Esomeprazole Magnesium (NEXIUM PO) Take 22.3  mg by mouth daily.      . hydrALAZINE (APRESOLINE) 25 MG tablet Take 2 tablets (50 mg total) by mouth 2 (two) times daily.  60 tablet  3  . hydrochlorothiazide (HYDRODIURIL) 12.5 MG tablet Take 1 tablet (12.5 mg total) by mouth daily.  30 tablet  3  . isosorbide dinitrate (ISORDIL) 20 MG tablet Take 20 mg by mouth 2 (two) times daily.      Marland Kitchen lisinopril (PRINIVIL,ZESTRIL) 40 MG tablet Take 1 tablet (40 mg total) by mouth daily.  30 tablet  1  . Multiple Vitamins-Minerals (MULTIVITAMIN WITH MINERALS) tablet Take 1 tablet by mouth daily.      . nitrofurantoin, macrocrystal-monohydrate, (MACROBID) 100 MG capsule Take 1 capsule (100 mg total) by mouth 2 (two) times daily.  28 capsule  0   No facility-administered medications prior to visit.    PAST MEDICAL HISTORY: Past Medical History  Diagnosis Date  . Hypertension   . Stroke   . High cholesterol     PAST SURGICAL HISTORY: Past Surgical History  Procedure Laterality Date  . Shoulder surgery Bilateral 1988, 1998    FAMILY HISTORY: Family History  Problem Relation Age of Onset  . Hypertension Mother   . Diabetes Sister   . Hypertension Sister     SOCIAL HISTORY:  History   Social History  . Marital Status: Married    Spouse Name: N/A    Number of Children: 1  . Years of Education: college   Occupational History  . retired    Social History Main Topics  . Smoking status:  Current Every Day Smoker -- 1.00 packs/day    Types: Cigarettes  . Smokeless tobacco: Not on file  . Alcohol Use: Yes  . Drug Use: No  . Sexually Active: Not on file   Other Topics Concern  . Not on file   Social History Narrative  . No narrative on file     PHYSICAL EXAM  Filed Vitals:   04/03/13 1421  BP: 150/90  Pulse: 92  Height: 6\' 3"  (1.905 m)  Weight: 176 lb (79.833 kg)    Not recorded    Body mass index is 22 kg/(m^2).  GENERAL EXAM: Patient is in no distress  CARDIOVASCULAR: Regular rate and rhythm, no murmurs, no  carotid bruits  NEUROLOGIC: MENTAL STATUS: awake, alert, language fluent, comprehension intact, naming intact CRANIAL NERVE: no papilledema on fundoscopic exam, pupils equal and reactive to light, visual fields full to confrontation, extraocular muscles intact, no nystagmus, facial sensation and strength symmetric, uvula midline, shoulder shrug symmetric, tongue midline. MOD DYSARTHIA. MOD DYSPHAGIA. WET COUGH. CLEARS THROAT CONSTANTLY. MOTOR: INCR TONE IN LUE AND LLE. RUE AND RLE 5/5. LUE 4, LLE 3-4. BRADYKINETIC IN LUE AND LLE. SENSORY: DECR IN LUE AND LLE COORDINATION: LIMITED IN LEFT SIDE. REFLEXES: 3+ BRISK THROUGHOUT, BUT MORE ON LEFT SIDE THAN RIGHT. GAIT/STATION: LEANS BACK IN CHAIR. CANNOT SIT UPRIGHT. BARELY ABLE TO STAND WITH 2 PERSON ASSIST.   DIAGNOSTIC DATA (LABS, IMAGING, TESTING) - I reviewed patient records, labs, notes, testing and imaging myself where available.  Lab Results  Component Value Date   WBC 9.0 03/11/2013   HGB 15.3 03/11/2013   HCT 45.0 03/11/2013   MCV 91.2 03/11/2013   PLT 246 03/11/2013      Component Value Date/Time   NA 141 03/25/2013 1232   K 4.4 03/25/2013 1232   CL 106 03/25/2013 1232   CO2 28 03/25/2013 1232   GLUCOSE 96 03/25/2013 1232   BUN 17 03/25/2013 1232   CREATININE 1.80* 03/25/2013 1232   CREATININE 1.70* 03/11/2013 1705   CALCIUM 10.4 03/25/2013 1232   PROT 6.6 03/25/2013 1232   ALBUMIN 4.0 03/25/2013 1232   AST 13 03/25/2013 1232   ALT 9 03/25/2013 1232   ALKPHOS 78 03/25/2013 1232   BILITOT 0.5 03/25/2013 1232   GFRNONAA 35* 02/27/2013 1615   GFRAA 40* 02/27/2013 1615   Lab Results  Component Value Date   CHOL 137 03/25/2013   HDL 41 03/25/2013   LDLCALC 78 03/25/2013   TRIG 92 03/25/2013   CHOLHDL 3.3 03/25/2013   No results found for this basename: HGBA1C   No results found for this basename: VITAMINB12   No results found for this basename: TSH    02/27/13 CT head - severe chronic small vessel ischemic disease, periventricular, subcortical, pontine and  cerebellar.    ASSESSMENT AND PLAN  62 y.o. year old male  has a past medical history of Hypertension; Stroke; and High cholesterol. here with progressive gait and balance or deterioration over the past one year.   Ddx: progression of cerebrovascular disease, chronic alcohol abuse, cervical myelopathy or other neurodegenerative condition (parkinsonism, parkinson's plus)  PLAN: 1. MRI brain and c-spine 2. Follow up with PCP for risk factor mgmt   Penni Bombard, MD [id number], [id number] PM Certified in Neurology, Neurophysiology and [facility] [address] [place],  [address] [phone]

## 2013-04-03 NOTE — Patient Instructions (Signed)
Stop smoking.  Stop drinking alcohol.  Follow up with PCP regarding BP control, cholesterol and general health.  I will order MRIs.

## 2013-04-15 DIAGNOSIS — R269 Unspecified abnormalities of gait and mobility: Secondary | ICD-10-CM

## 2013-04-22 ENCOUNTER — Other Ambulatory Visit: Payer: Self-pay | Admitting: Diagnostic Neuroimaging

## 2013-04-22 DIAGNOSIS — R471 Dysarthria and anarthria: Secondary | ICD-10-CM

## 2013-04-22 DIAGNOSIS — I639 Cerebral infarction, unspecified: Secondary | ICD-10-CM

## 2013-04-22 DIAGNOSIS — R269 Unspecified abnormalities of gait and mobility: Secondary | ICD-10-CM

## 2013-04-29 ENCOUNTER — Ambulatory Visit: Payer: Medicare Other

## 2013-05-01 ENCOUNTER — Emergency Department (HOSPITAL_COMMUNITY)
Admission: EM | Admit: 2013-05-01 | Discharge: 2013-05-01 | Disposition: A | Discharge status: Home / Self Care | Payer: Medicare Other | Attending: Emergency Medicine | Admitting: Emergency Medicine

## 2013-05-01 ENCOUNTER — Emergency Department (HOSPITAL_COMMUNITY): Payer: Medicare Other

## 2013-05-01 ENCOUNTER — Encounter (HOSPITAL_COMMUNITY): Payer: Self-pay | Admitting: Emergency Medicine

## 2013-05-01 DIAGNOSIS — M7989 Other specified soft tissue disorders: Secondary | ICD-10-CM | POA: Insufficient documentation

## 2013-05-01 DIAGNOSIS — M7020 Olecranon bursitis, unspecified elbow: Secondary | ICD-10-CM | POA: Insufficient documentation

## 2013-05-01 DIAGNOSIS — Z7982 Long term (current) use of aspirin: Secondary | ICD-10-CM | POA: Insufficient documentation

## 2013-05-01 DIAGNOSIS — Z862 Personal history of diseases of the blood and blood-forming organs and certain disorders involving the immune mechanism: Secondary | ICD-10-CM | POA: Insufficient documentation

## 2013-05-01 DIAGNOSIS — Z79899 Other long term (current) drug therapy: Secondary | ICD-10-CM | POA: Insufficient documentation

## 2013-05-01 DIAGNOSIS — Z8639 Personal history of other endocrine, nutritional and metabolic disease: Secondary | ICD-10-CM | POA: Insufficient documentation

## 2013-05-01 DIAGNOSIS — I10 Essential (primary) hypertension: Secondary | ICD-10-CM | POA: Insufficient documentation

## 2013-05-01 DIAGNOSIS — M7022 Olecranon bursitis, left elbow: Secondary | ICD-10-CM

## 2013-05-01 DIAGNOSIS — F17200 Nicotine dependence, unspecified, uncomplicated: Secondary | ICD-10-CM | POA: Insufficient documentation

## 2013-05-01 DIAGNOSIS — Z8673 Personal history of transient ischemic attack (TIA), and cerebral infarction without residual deficits: Secondary | ICD-10-CM | POA: Insufficient documentation

## 2013-05-01 NOTE — ED Notes (Signed)
Per pt, lost balance and fell onto left side-stroke in past, and has left sided deficits

## 2013-05-01 NOTE — ED Provider Notes (Signed)
CSN: [id number]     Arrival date & time 05/01/13  1354 History   First MD Initiated Contact with Patient 05/01/13 1416     Chief Complaint  Patient presents with  . left elbow pain     HPI Patient states he fell on Tuesday. Patient has a history of a prior stroke and has some balance problems. He fell after losing his balance on Tuesday. Patient states he does not really have any significant pain but he started noticing swelling of his elbow that has increased. This concerned him so he came in for evaluation. patient denies fevers or chills. Denies any numbness or weakness. Past Medical History  Diagnosis Date  . Hypertension   . Stroke   . High cholesterol    Past Surgical History  Procedure Laterality Date  . Shoulder surgery Bilateral 1988, 1998   Family History  Problem Relation Age of Onset  . Hypertension Mother   . Diabetes Sister   . Hypertension Sister    History  Substance Use Topics  . Smoking status: Current Every Day Smoker -- 1.00 packs/day    Types: Cigarettes  . Smokeless tobacco: Not on file  . Alcohol Use: Yes    Review of Systems  All other systems reviewed and are negative.    Allergies  Atacand hct and Shellfish allergy  Home Medications   Current Outpatient Rx  Name  Route  Sig  Dispense  Refill  . aspirin EC 81 MG tablet   Oral   Take 81 mg by mouth at bedtime.         Marland Kitchen esomeprazole (NEXIUM) 40 MG capsule   Oral   Take 40 mg by mouth daily before breakfast.         . hydrALAZINE (APRESOLINE) 25 MG tablet   Oral   Take 2 tablets (50 mg total) by mouth 2 (two) times daily.   60 tablet   3   . hydrochlorothiazide (HYDRODIURIL) 12.5 MG tablet   Oral   Take 1 tablet (12.5 mg total) by mouth daily.   30 tablet   3   . isosorbide dinitrate (ISORDIL) 20 MG tablet   Oral   Take 20 mg by mouth 2 (two) times daily.         Marland Kitchen lisinopril (PRINIVIL,ZESTRIL) 40 MG tablet   Oral   Take 1 tablet (40 mg total) by mouth daily.   30  tablet   1   . Multiple Vitamins-Minerals (MULTIVITAMIN WITH MINERALS) tablet   Oral   Take 1 tablet by mouth daily.          BP 164/96  Pulse 94  Temp(Src) 98.5 F (36.9 C) (Oral)  Resp 16  SpO2 98% Physical Exam  Nursing note and vitals reviewed. Constitutional: He appears well-developed and well-nourished. No distress.  HENT:  Head: Normocephalic and atraumatic.  Right Ear: External ear normal.  Left Ear: External ear normal.  Eyes: Conjunctivae are normal. Right eye exhibits no discharge. Left eye exhibits no discharge. No scleral icterus.  Neck: Neck supple. No tracheal deviation present.  Cardiovascular: Normal rate.   Pulmonary/Chest: Effort normal. No stridor. No respiratory distress. He has no wheezes. He has no rales.  Abdominal: Soft. Bowel sounds are normal. He exhibits no distension. There is no tenderness. There is no rebound and no guarding.  Musculoskeletal: He exhibits no edema and no tenderness.  Neurological: He is alert. He has normal strength. No sensory deficit. Cranial nerve deficit:  no gross defecits  noted. He exhibits normal muscle tone. He displays no seizure activity. Coordination normal.  Skin: Skin is warm and dry. No rash noted.  Psychiatric: He has a normal mood and affect.    ED Course   Procedures (including critical care time)  Labs Reviewed - No data to display Dg Elbow Complete Left  05/01/2013   *RADIOLOGY REPORT*  Clinical Data: History of injury from fall 2 days previously.  Pain and swelling of left elbow.  LEFT ELBOW - COMPLETE 3+ VIEW  Comparison: None.  Findings: There is no positive fat pad sign present to suggest joint effusion.  There is considerable soft tissue swelling involving the olecranon region.  There is a posterior olecranon spurring.  No fracture or dislocation is seen.  IMPRESSION: No fracture or dislocation evident.  No joint effusions seen. There is considerable soft tissue swelling involving the olecranon region.  Is  there palpable blood or fluid in this area?  There is posterior olecranon spurring.   Original Report Authenticated By: Shanon Brow Call   1. Olecranon bursitis, left     MDM  Pt has had the swelling since Tuesday.  Discussed supportive treatment.  No need for aspiration at this time.  If not getting better, follow up with ortho.  Kathalene Frames, MD 05/01/13 [phone]

## 2013-05-07 ENCOUNTER — Ambulatory Visit: Payer: Medicare Other

## 2013-05-23 ENCOUNTER — Telehealth: Payer: Self-pay | Admitting: Emergency Medicine

## 2013-05-23 ENCOUNTER — Ambulatory Visit: Payer: Medicare Other

## 2013-05-23 NOTE — Telephone Encounter (Signed)
Wife called in to reschedule f/u appt for 06/05/13 @ 515 pm

## 2013-06-05 ENCOUNTER — Ambulatory Visit: Payer: Medicare Other | Attending: Internal Medicine | Admitting: Internal Medicine

## 2013-06-05 ENCOUNTER — Encounter: Payer: Self-pay | Admitting: Internal Medicine

## 2013-06-05 VITALS — BP 156/94 | HR 77 | Temp 99.7°F | Resp 14 | Ht 75.0 in | Wt 175.0 lb

## 2013-06-05 DIAGNOSIS — I10 Essential (primary) hypertension: Secondary | ICD-10-CM

## 2013-06-05 DIAGNOSIS — K4090 Unilateral inguinal hernia, without obstruction or gangrene, not specified as recurrent: Secondary | ICD-10-CM

## 2013-06-05 DIAGNOSIS — I679 Cerebrovascular disease, unspecified: Secondary | ICD-10-CM

## 2013-06-05 DIAGNOSIS — Z09 Encounter for follow-up examination after completed treatment for conditions other than malignant neoplasm: Secondary | ICD-10-CM | POA: Insufficient documentation

## 2013-06-05 NOTE — Progress Notes (Signed)
Pt is here to F/U with his hypertension Pt had a stroke back in 2011. Pt reports that his balance is still off. He states that his speech is a little better.

## 2013-06-05 NOTE — Progress Notes (Signed)
Patient ID: Darrell [name], male   DOB: 01-19-1951, 62 y.o.   MRN: [id number] Patient Demographics  Darrell [name], is a 62 y.o. male  [id number]  [id number]  DOB - [birth year]  Chief Complaint  Patient presents with  . Follow-up        Subjective:   Darrell [name] today is here for a follow up visit and for blood pressure check. No new complaints today. Patient also wants to discuss the results of his MRI brain. Patient claims to be doing well at home except for recent family death that has caused a lot of distractions. Patient has No headache, No chest pain, No abdominal pain - No Nausea, No new weakness tingling or numbness, No Cough - SOB.  patient continue to smoke cigarettes and drink alcohol despite repeated counseling.  ALLERGIES:   Allergies  Allergen Reactions  . Atacand Hct [Candesartan Cilexetil-Hctz] Hives  . Shellfish Allergy Hives    PAST MEDICAL HISTORY: Past Medical History  Diagnosis Date  . Hypertension   . Stroke   . High cholesterol     MEDICATIONS AT HOME: Prior to Admission medications   Medication Sig Start Date End Date Taking? Authorizing Provider  aspirin EC 81 MG tablet Take 81 mg by mouth at bedtime.   Yes Historical Provider, MD  esomeprazole (NEXIUM) 40 MG capsule Take 40 mg by mouth daily before breakfast.   Yes Historical Provider, MD  hydrALAZINE (APRESOLINE) 25 MG tablet Take 2 tablets (50 mg total) by mouth 2 (two) times daily. 03/25/13  Yes Clanford Marisa Hua, MD  hydrochlorothiazide (HYDRODIURIL) 12.5 MG tablet Take 1 tablet (12.5 mg total) by mouth daily. 03/25/13  Yes Clanford Marisa Hua, MD  isosorbide dinitrate (ISORDIL) 20 MG tablet Take 20 mg by mouth 2 (two) times daily.   Yes Historical Provider, MD  lisinopril (PRINIVIL,ZESTRIL) 40 MG tablet Take 1 tablet (40 mg total) by mouth daily. 03/25/13  Yes Clanford Marisa Hua, MD  Multiple Vitamins-Minerals (MULTIVITAMIN WITH MINERALS) tablet Take 1 tablet by mouth daily.   Yes Historical  Provider, MD     Objective:   Filed Vitals:   06/05/13 1715  BP: 156/94  Pulse: 77  Temp: 99.7 F (37.6 C)  TempSrc: Oral  Resp: 14  Height: 6\' 3"  (1.905 m)  Weight: 175 lb (79.379 kg)  SpO2: 98%    Exam General appearance :Awake, alert, not in any distress. Speech Clear. Not toxic Looking HEENT: Atraumatic and Normocephalic, pupils equally reactive to light and accomodation Neck: supple, no JVD. No cervical lymphadenopathy.  Chest:Good air entry bilaterally, no added sounds  CVS: S1 S2 regular, no murmurs.  Abdomen: Bowel sounds present, Non tender and not distended with no gaurding, rigidity or rebound. Right reducible inguinal hernia Extremities: B/L Lower Ext shows no edema, both legs are warm to touch Neurology: Awake alert, and oriented X 3 Skin:No Rash Wounds:N/A   Data Review   CBC No results found for this basename: WBC, HGB, HCT, PLT, MCV, MCH, MCHC, RDW, NEUTRABS, LYMPHSABS, MONOABS, EOSABS, BASOSABS, BANDABS, BANDSABD,  in the last 168 hours  Chemistries   No results found for this basename: NA, K, CL, CO2, GLUCOSE, BUN, CREATININE, GFRCGP, CALCIUM, MG, AST, ALT, ALKPHOS, BILITOT,  in the last 168 hours ------------------------------------------------------------------------------------------------------------------ No results found for this basename: HGBA1C,  in the last 72 hours ------------------------------------------------------------------------------------------------------------------ No results found for this basename: CHOL, HDL, LDLCALC, TRIG, CHOLHDL, LDLDIRECT,  in the last 72 hours ------------------------------------------------------------------------------------------------------------------ No results found for this basename: TSH, T4TOTAL,  FREET3, T3FREE, THYROIDAB,  in the last 72 hours ------------------------------------------------------------------------------------------------------------------ No results found for this basename:  VITAMINB12, FOLATE, FERRITIN, TIBC, IRON, RETICCTPCT,  in the last 72 hours  Coagulation profile  No results found for this basename: INR, PROTIME,  in the last 168 hours    Assessment & Plan   Patient Active Problem List   Diagnosis Date Noted  . Unspecified essential hypertension 03/25/2013  . Inguinal hernia 03/25/2013  . Cerebrovascular disease 03/25/2013  . Syncope and collapse 10/09/2012  . HTN (hypertension) 10/09/2012  . ETOH abuse 10/09/2012  . Tobacco abuse 10/09/2012     Plan: MRI brain report discussed with patient  Patient extensively counseled about smoking cessation Patient counseled about reducing alcohol use or quit in completely Patient was encouraged to be compliant with medications We discussed blood pressure goal on this visit Patient encouraged to keep his appointment with a neurologist and neurophysiologist  Ambulatory referral to surgery for right inguinal hernia  Follow up in 2 months   The patient was given clear instructions to go to ER or return to medical center if symptoms don't improve, worsen or new problems develop. The patient verbalized understanding. The patient was told to call to get lab results if they haven't heard anything in the next week.   Angelica Chessman, MD, [place], [place] [facility] [place], [place]   06/05/2013, 6:09 PM

## 2013-06-19 ENCOUNTER — Telehealth: Payer: Self-pay | Admitting: Emergency Medicine

## 2013-06-19 NOTE — Telephone Encounter (Signed)
Pt wife called wanting date for referral to general surgery appt. States she lost paper. Gave her Alinda [place] # for info

## 2013-06-20 ENCOUNTER — Telehealth: Payer: Self-pay | Admitting: Diagnostic Neuroimaging

## 2013-06-20 ENCOUNTER — Ambulatory Visit (INDEPENDENT_AMBULATORY_CARE_PROVIDER_SITE_OTHER): Payer: Medicare Other | Admitting: Surgery

## 2013-06-20 ENCOUNTER — Encounter (INDEPENDENT_AMBULATORY_CARE_PROVIDER_SITE_OTHER): Payer: Self-pay | Admitting: Surgery

## 2013-06-20 VITALS — BP 130/84 | HR 68 | Temp 98.9°F | Resp 14 | Ht 75.5 in | Wt 175.0 lb

## 2013-06-20 DIAGNOSIS — R269 Unspecified abnormalities of gait and mobility: Secondary | ICD-10-CM

## 2013-06-20 DIAGNOSIS — K4090 Unilateral inguinal hernia, without obstruction or gangrene, not specified as recurrent: Secondary | ICD-10-CM

## 2013-06-20 NOTE — Telephone Encounter (Signed)
Patient wife inquiry about MRI results. MRI was done in July, please call.

## 2013-06-20 NOTE — Progress Notes (Signed)
Patient ID: Darrell [name], male   DOB: 06/02/1951, 62 y.o.   MRN: [id number]  Chief Complaint  Patient presents with  . Hernia    HPI Darrell [name] is a 62 y.o. male.  Patient sent at the request of Dr Irwin Brakeman for right inguinal hernia. This is been present for least a year. His wife states he has been evaluated for this in the past and uses multiple medical problems, continued tobacco abuse and history of stroke this was felt to be the best course of action. The hernias got larger and he is here today for evaluation of this.patient denies any pain at all and is not reactive secondary to his stroke. HPI  Past Medical History  Diagnosis Date  . Hypertension   . Stroke   . High cholesterol     Past Surgical History  Procedure Laterality Date  . Shoulder surgery Bilateral 1988, 1998    Family History  Problem Relation Age of Onset  . Hypertension Mother   . Diabetes Sister   . Hypertension Sister     Social History History  Substance Use Topics  . Smoking status: Current Every Day Smoker -- 1.00 packs/day    Types: Cigarettes  . Smokeless tobacco: Not on file  . Alcohol Use: Yes    Allergies  Allergen Reactions  . Atacand Hct [Candesartan Cilexetil-Hctz] Hives  . Shellfish Allergy Hives    Current Outpatient Prescriptions  Medication Sig Dispense Refill  . aspirin EC 81 MG tablet Take 81 mg by mouth at bedtime.      Marland Kitchen esomeprazole (NEXIUM) 40 MG capsule Take 40 mg by mouth daily before breakfast.      . hydrALAZINE (APRESOLINE) 25 MG tablet Take 2 tablets (50 mg total) by mouth 2 (two) times daily.  60 tablet  3  . hydrochlorothiazide (HYDRODIURIL) 12.5 MG tablet Take 1 tablet (12.5 mg total) by mouth daily.  30 tablet  3  . isosorbide dinitrate (ISORDIL) 20 MG tablet Take 20 mg by mouth 2 (two) times daily.      Marland Kitchen lisinopril (PRINIVIL,ZESTRIL) 40 MG tablet Take 1 tablet (40 mg total) by mouth daily.  30 tablet  1  . Multiple Vitamins-Minerals (MULTIVITAMIN  WITH MINERALS) tablet Take 1 tablet by mouth daily.       No current facility-administered medications for this visit.    Review of Systems Review of Systems  HENT: Negative.   Eyes: Negative.   Respiratory: Negative.   Cardiovascular: Negative.   Gastrointestinal: Negative.   Endocrine: Negative.   Genitourinary: Negative.   Allergic/Immunologic: Negative.   Neurological: Positive for tremors, speech difficulty and weakness.  Hematological: Negative.   Psychiatric/Behavioral: Negative.     Blood pressure 130/84, pulse 68, temperature 98.9 F (37.2 C), temperature source Temporal, resp. rate 14, height 6' 3.5" (1.918 m), weight 175 lb (79.379 kg).  Physical Exam Physical Exam  Constitutional: He is oriented to person, place, and time. He appears well-developed and well-nourished.  HENT:  Head: Normocephalic and atraumatic.  Eyes: Conjunctivae are normal. No scleral icterus.  Neck: Normal range of motion. Neck supple.  Cardiovascular: Normal rate and regular rhythm.   No murmur heard. Pulmonary/Chest: Effort normal and breath sounds normal.  Abdominal: Soft. Bowel sounds are normal. There is no tenderness. A hernia is present. Hernia confirmed positive in the right inguinal area.  Musculoskeletal: Normal range of motion.  Neurological: He is alert and oriented to person, place, and time.  Skin: Skin is warm  and dry.  Psychiatric: He has a normal mood and affect. His behavior is normal. Judgment and thought content normal.    Data Reviewed none  Assessment    Right inguinal hernia asymptomatic and reducible Patient Active Problem List   Diagnosis Date Noted  . Unspecified essential hypertension 03/25/2013  . Inguinal hernia 03/25/2013  . Cerebrovascular disease 03/25/2013  . Syncope and collapse 10/09/2012  . HTN (hypertension) 10/09/2012  . ETOH abuse 10/09/2012  . Tobacco abuse 10/09/2012      Plan    Discussed continued observation versus operative  intervention. The risks, benefits and alternative therapies discussed. Outcomes of observation versus surgery discussed. He has no interest in seeking repair at this point due to his other medical problems. He is to the most part wheelchair bound and the hernias causing no discomfort. He has got larger and I explained to them that this may require repair at some point. I told him if he develops more pain, the area becomes hard or is significant nausea or vomiting to contact us or seek care in the emergency room immediately. He'll continue to observe this for now and call me if anything changes.       Quincey Nored A. 06/20/2013, 11:50 AM

## 2013-06-20 NOTE — Patient Instructions (Signed)
Hernia A hernia occurs when an internal organ pushes out through a weak spot in the abdominal wall. Hernias most commonly occur in the groin and around the navel. Hernias often can be pushed back into place (reduced). Most hernias tend to get worse over time. Some abdominal hernias can get stuck in the opening (irreducible or incarcerated hernia) and cannot be reduced. An irreducible abdominal hernia which is tightly squeezed into the opening is at risk for impaired blood supply (strangulated hernia). A strangulated hernia is a medical emergency. Because of the risk for an irreducible or strangulated hernia, surgery may be recommended to repair a hernia. CAUSES   Heavy lifting.  Prolonged coughing.  Straining to have a bowel movement.  A cut (incision) made during an abdominal surgery. HOME CARE INSTRUCTIONS   Bed rest is not required. You may continue your normal activities.  Avoid lifting more than 10 pounds (4.5 kg) or straining.  Cough gently. If you are a smoker it is best to stop. Even the best hernia repair can break down with the continual strain of coughing. Even if you do not have your hernia repaired, a cough will continue to aggravate the problem.  Do not wear anything tight over your hernia. Do not try to keep it in with an outside bandage or truss. These can damage abdominal contents if they are trapped within the hernia sac.  Eat a normal diet.  Avoid constipation. Straining over long periods of time will increase hernia size and encourage breakdown of repairs. If you cannot do this with diet alone, stool softeners may be used. SEEK IMMEDIATE MEDICAL CARE IF:   You have a fever.  You develop increasing abdominal pain.  You feel nauseous or vomit.  Your hernia is stuck outside the abdomen, looks discolored, feels hard, or is tender.  You have any changes in your bowel habits or in the hernia that are unusual for you.  You have increased pain or swelling around the  hernia.  You cannot push the hernia back in place by applying gentle pressure while lying down. MAKE SURE YOU:   Understand these instructions.  Will watch your condition.  Will get help right away if you are not doing well or get worse. Document Released: 09/11/2005 Document Revised: 12/04/2011 Document Reviewed: 04/30/2008 [employer] Patient Information 2014 [employer].  

## 2013-08-07 NOTE — Telephone Encounter (Signed)
I called patient. Reviewed MRI results. I suggest outpatient PT. He is not a good surgical candidate at this time. -VRP

## 2013-08-25 ENCOUNTER — Ambulatory Visit: Payer: Medicare Other | Admitting: Physical Therapy

## 2013-09-01 ENCOUNTER — Ambulatory Visit: Payer: Medicare Other | Attending: Diagnostic Neuroimaging

## 2013-09-01 DIAGNOSIS — R279 Unspecified lack of coordination: Secondary | ICD-10-CM | POA: Insufficient documentation

## 2013-09-01 DIAGNOSIS — R269 Unspecified abnormalities of gait and mobility: Secondary | ICD-10-CM | POA: Insufficient documentation

## 2013-09-01 DIAGNOSIS — IMO0001 Reserved for inherently not codable concepts without codable children: Secondary | ICD-10-CM | POA: Insufficient documentation

## 2013-09-01 DIAGNOSIS — R262 Difficulty in walking, not elsewhere classified: Secondary | ICD-10-CM | POA: Insufficient documentation

## 2013-09-05 ENCOUNTER — Ambulatory Visit: Payer: Medicare Other

## 2013-09-10 ENCOUNTER — Ambulatory Visit: Payer: Medicare Other

## 2013-09-12 ENCOUNTER — Telehealth: Payer: Self-pay | Admitting: *Deleted

## 2013-09-12 ENCOUNTER — Ambulatory Visit: Payer: Medicare Other

## 2013-09-15 ENCOUNTER — Ambulatory Visit: Payer: Medicare Other

## 2013-09-17 ENCOUNTER — Ambulatory Visit: Payer: Medicare Other

## 2013-09-24 ENCOUNTER — Ambulatory Visit: Payer: Medicare Other

## 2013-09-26 ENCOUNTER — Ambulatory Visit: Payer: Medicare Other | Admitting: Physical Therapy

## 2013-10-01 ENCOUNTER — Ambulatory Visit: Payer: Medicare Other | Admitting: Physical Therapy

## 2013-10-01 ENCOUNTER — Telehealth: Payer: Self-pay | Admitting: Diagnostic Neuroimaging

## 2013-10-01 NOTE — Telephone Encounter (Signed)
Called PT to remind them of the appt on Friday 1/9.  Wife expressed that some time ago they had requested for Mr. Darrell [name] to receive Speech and Occupational Therapy but they have not heard anything.  Please call.

## 2013-10-03 ENCOUNTER — Ambulatory Visit (INDEPENDENT_AMBULATORY_CARE_PROVIDER_SITE_OTHER): Payer: Medicare Other | Admitting: Diagnostic Neuroimaging

## 2013-10-03 ENCOUNTER — Encounter: Payer: Self-pay | Admitting: Diagnostic Neuroimaging

## 2013-10-03 ENCOUNTER — Ambulatory Visit: Payer: Medicare Other | Attending: Diagnostic Neuroimaging

## 2013-10-03 VITALS — BP 193/112 | HR 75 | Temp 98.3°F

## 2013-10-03 DIAGNOSIS — R531 Weakness: Secondary | ICD-10-CM

## 2013-10-03 DIAGNOSIS — Z5189 Encounter for other specified aftercare: Secondary | ICD-10-CM | POA: Insufficient documentation

## 2013-10-03 DIAGNOSIS — R259 Unspecified abnormal involuntary movements: Secondary | ICD-10-CM

## 2013-10-03 DIAGNOSIS — R4701 Aphasia: Secondary | ICD-10-CM | POA: Insufficient documentation

## 2013-10-03 DIAGNOSIS — R5383 Other fatigue: Secondary | ICD-10-CM

## 2013-10-03 DIAGNOSIS — R1310 Dysphagia, unspecified: Secondary | ICD-10-CM | POA: Insufficient documentation

## 2013-10-03 DIAGNOSIS — R262 Difficulty in walking, not elsewhere classified: Secondary | ICD-10-CM | POA: Insufficient documentation

## 2013-10-03 DIAGNOSIS — I639 Cerebral infarction, unspecified: Secondary | ICD-10-CM

## 2013-10-03 DIAGNOSIS — R269 Unspecified abnormalities of gait and mobility: Secondary | ICD-10-CM

## 2013-10-03 DIAGNOSIS — I6350 Cerebral infarction due to unspecified occlusion or stenosis of unspecified cerebral artery: Secondary | ICD-10-CM

## 2013-10-03 DIAGNOSIS — R279 Unspecified lack of coordination: Secondary | ICD-10-CM | POA: Insufficient documentation

## 2013-10-03 DIAGNOSIS — R471 Dysarthria and anarthria: Secondary | ICD-10-CM

## 2013-10-03 DIAGNOSIS — R292 Abnormal reflex: Secondary | ICD-10-CM

## 2013-10-03 DIAGNOSIS — R5381 Other malaise: Secondary | ICD-10-CM

## 2013-10-03 DIAGNOSIS — R253 Fasciculation: Secondary | ICD-10-CM

## 2013-10-03 NOTE — Progress Notes (Signed)
[facility]  PATIENT: Darrell [name] DOB: [birth year]  REFERRING CLINICIAN: Vanita Panda HISTORY FROM: patient and wife REASON FOR VISIT: new consult   HISTORICAL  CHIEF COMPLAINT:  Chief Complaint  Patient presents with  . Follow-up    stroke, gait difficulty    HISTORY OF PRESENT ILLNESS:   UPDATE 10/03/13: Since last visit, continues to have left sided weakness, worsening speech and swallow difficulty. Is drinking half pint Etoh per 1-2 weeks. Cigarette use has increased to 2 packs per day.  PRIOR HPI (04/03/13): 63 year old right-handed male with hypertension, hypercholesterolemia, smoking, alcohol abuse, here for evaluation of 8 and balance difficulty for past one year.  2011 patient had left arm and left leg weakness, diagnosed with stroke. Apparently he was admitted to hospital and treated. He had fairly good recovery initial one year after stroke. He required using a walking stick but otherwise was fairly mobile. Unfortunately he did not have good medical followup at that time. Patient was abusing alcohol significantly at that time, ranging from 1 pint up to a fifth of alcohol per day. In the past one to 2 years he has cut down on his drinking, now averaging 1 pint of alcohol per week.  In the past one year, patient's mobility is significantly deteriorated. He also has increasing slurred speech, trouble swallowing, balance and gait difficulty. He continues to fall down. Symptoms are worse when he drinks alcohol. Nowadays he is unable to stand up and walk across a room without assistance. This is been the case for the past one year.  REVIEW OF SYSTEMS: Full 14 system review of systems performed and notable only for those only as per HPI. Otherwise negative.   ALLERGIES: Allergies  Allergen Reactions  . Atacand Hct [Candesartan Cilexetil-Hctz] Hives  . Shellfish Allergy Hives    HOME MEDICATIONS: Outpatient Prescriptions Prior to Visit  Medication Sig  Dispense Refill  . aspirin EC 81 MG tablet Take 81 mg by mouth at bedtime.      Marland Kitchen esomeprazole (NEXIUM) 40 MG capsule Take 40 mg by mouth daily before breakfast.      . hydrALAZINE (APRESOLINE) 25 MG tablet Take 2 tablets (50 mg total) by mouth 2 (two) times daily.  60 tablet  3  . hydrochlorothiazide (HYDRODIURIL) 12.5 MG tablet Take 1 tablet (12.5 mg total) by mouth daily.  30 tablet  3  . isosorbide dinitrate (ISORDIL) 20 MG tablet Take 20 mg by mouth 2 (two) times daily.      Marland Kitchen lisinopril (PRINIVIL,ZESTRIL) 40 MG tablet Take 1 tablet (40 mg total) by mouth daily.  30 tablet  1  . Multiple Vitamins-Minerals (MULTIVITAMIN WITH MINERALS) tablet Take 1 tablet by mouth daily.       No facility-administered medications prior to visit.    PAST MEDICAL HISTORY: Past Medical History  Diagnosis Date  . Hypertension   . Stroke   . High cholesterol     PAST SURGICAL HISTORY: Past Surgical History  Procedure Laterality Date  . Shoulder surgery Bilateral 1988, 1998    FAMILY HISTORY: Family History  Problem Relation Age of Onset  . Hypertension Mother   . Diabetes Sister   . Hypertension Sister     SOCIAL HISTORY:  History   Social History  . Marital Status: Married    Spouse Name: [name]    Number of Children: 1  . Years of Education: college   Occupational History  . retired    Social History Main Topics  .  Smoking status: Current Every Day Smoker -- 1.00 packs/day    Types: Cigarettes  . Smokeless tobacco: Never Used  . Alcohol Use: Yes  . Drug Use: No  . Sexual Activity: Not on file   Other Topics Concern  . Not on file   Social History Narrative   Patient lives at home with spouse.   Caffeine Use: 1-3 cups daily     PHYSICAL EXAM  Filed Vitals:   10/03/13 1150  BP: 193/112  Pulse: 75  Temp: 98.3 F (36.8 C)  TempSrc: Oral    Not recorded    Cannot calculate BMI with a height equal to zero.  GENERAL EXAM: Patient is in no  distress  CARDIOVASCULAR: Regular rate and rhythm, no murmurs, no carotid bruits  NEUROLOGIC: MENTAL STATUS: awake, alert, language fluent, comprehension intact, naming intact CRANIAL NERVE: no papilledema on fundoscopic exam, pupils equal and reactive to light, visual fields full to confrontation, extraocular muscles intact, no nystagmus, facial sensation and strength symmetric, uvula midline, shoulder shrug symmetric, tongue midline. MOD DYSARTHIA. MOD DYSPHAGIA. WET COUGH. CLEARS THROAT CONSTANTLY. MOTOR: FASCICULATIONS BILATERAL CALVES. NO TONGUE FASCI. INCR TONE IN LUE AND LLE. RUE AND RLE 5/5. LUE AND LLE 3-4. BRADYKINETIC IN LUE AND LLE. SENSORY: DECR IN LUE AND LLE COORDINATION: LIMITED IN LEFT SIDE. REFLEXES: 3+ BRISK THROUGHOUT, BUT MORE ON LEFT SIDE THAN RIGHT. GAIT/STATION: LEANS BACK IN CHAIR. CANNOT SIT UPRIGHT. BARELY ABLE TO STAND WITH 2 PERSON ASSIST.   DIAGNOSTIC DATA (LABS, IMAGING, TESTING) - I reviewed patient records, labs, notes, testing and imaging myself where available.  Lab Results  Component Value Date   WBC 9.0 03/11/2013   HGB 15.3 03/11/2013   HCT 45.0 03/11/2013   MCV 91.2 03/11/2013   PLT 246 03/11/2013      Component Value Date/Time   NA 141 03/25/2013 1232   K 4.4 03/25/2013 1232   CL 106 03/25/2013 1232   CO2 28 03/25/2013 1232   GLUCOSE 96 03/25/2013 1232   BUN 17 03/25/2013 1232   CREATININE 1.80* 03/25/2013 1232   CREATININE 1.70* 03/11/2013 1705   CALCIUM 10.4 03/25/2013 1232   PROT 6.6 03/25/2013 1232   ALBUMIN 4.0 03/25/2013 1232   AST 13 03/25/2013 1232   ALT 9 03/25/2013 1232   ALKPHOS 78 03/25/2013 1232   BILITOT 0.5 03/25/2013 1232   GFRNONAA 35* 02/27/2013 1615   GFRAA 40* 02/27/2013 1615   Lab Results  Component Value Date   CHOL 137 03/25/2013   HDL 41 03/25/2013   LDLCALC 78 03/25/2013   TRIG 92 03/25/2013   CHOLHDL 3.3 03/25/2013   No results found for this basename: HGBA1C   No results found for this basename: VITAMINB12   No results found for this  basename: TSH    02/27/13 CT head - severe chronic small vessel ischemic disease, periventricular, subcortical, pontine and cerebellar.   04/22/13 MRI BRAIN 1. Scattered chronic lacunar ischemic infarctions in the bilateral thalami, right basal ganglia, right centrum semioval and left anterior parasagittal regions.  2. Moderate chronic small vessel ischemic disease. Few small, chronic cerebral microbleeds in the right parietal and and left frontal regions, also within spectrum of chronic small vessel ischemic disease. 3. No acute findings.  04/22/13 MRI CERVICAL SPINE 1. At C5-6: disc bulging and facet hypertrophy with mild spinal stenosis (AP diameter 34mm) and severe biforaminal foraminal stenosis; no cord signal changes. 2. Multilevel degenerative changes and foraminal stenosis from C2-3 to C6-7 as above.  ASSESSMENT AND PLAN  63 y.o. year old male  has a past medical history of Hypertension; Stroke; and High cholesterol. here with progressive gait and balance or deterioration over the past one year. Also with dysarthria, dysphagia, fasciculations, left sided weakness and hyperreflexia.   Ddx: multi-focal cerebrovascular disease, chronic alcohol abuse, cervical myelopathy, motor neuron disease or other neurodegenerative condition (parkinsonism, parkinson's plus)  PLAN: 1. EMG/NCS (to eval for motor neuron disease) 2. Risk factor mgmt per PCP for cerebrovascular disease 3. Smoking and ETOH reduction/cessation reviewed 4. Continue PT; will add ST and OT evals 5. Conservative mgmt of cervical spinal stenosis (mild at C5-6)  Orders Placed This Encounter  Procedures  . Ambulatory referral to Speech Therapy  . Ambulatory referral to Occupational Therapy  . NCV with EMG(electromyography)   Return for EMG.    Penni Bombard, MD [id number], [id number] PM Certified in Neurology, Neurophysiology and Neuroimaging  [facility] [address], [place],   [address] [phone]

## 2013-10-06 ENCOUNTER — Ambulatory Visit: Payer: Medicare Other | Admitting: Physical Therapy

## 2013-10-07 ENCOUNTER — Encounter: Payer: Self-pay | Admitting: Internal Medicine

## 2013-10-07 ENCOUNTER — Ambulatory Visit: Payer: Medicare Other | Attending: Internal Medicine | Admitting: Internal Medicine

## 2013-10-07 VITALS — BP 168/81 | HR 73 | Temp 98.1°F | Resp 16

## 2013-10-07 DIAGNOSIS — R7303 Prediabetes: Secondary | ICD-10-CM | POA: Insufficient documentation

## 2013-10-07 DIAGNOSIS — I679 Cerebrovascular disease, unspecified: Secondary | ICD-10-CM

## 2013-10-07 DIAGNOSIS — R7309 Other abnormal glucose: Secondary | ICD-10-CM

## 2013-10-07 DIAGNOSIS — I10 Essential (primary) hypertension: Secondary | ICD-10-CM | POA: Insufficient documentation

## 2013-10-07 LAB — CBC WITH DIFFERENTIAL/PLATELET
BASOS PCT: 1 % (ref 0–1)
Basophils Absolute: 0 10*3/uL (ref 0.0–0.1)
EOS ABS: 0.4 10*3/uL (ref 0.0–0.7)
Eosinophils Relative: 4 % (ref 0–5)
HCT: 37.6 % — ABNORMAL LOW (ref 39.0–52.0)
Hemoglobin: 12.8 g/dL — ABNORMAL LOW (ref 13.0–17.0)
Lymphocytes Relative: 29 % (ref 12–46)
Lymphs Abs: 2.4 10*3/uL (ref 0.7–4.0)
MCH: 30.5 pg (ref 26.0–34.0)
MCHC: 34 g/dL (ref 30.0–36.0)
MCV: 89.5 fL (ref 78.0–100.0)
Monocytes Absolute: 0.7 10*3/uL (ref 0.1–1.0)
Monocytes Relative: 8 % (ref 3–12)
NEUTROS ABS: 4.9 10*3/uL (ref 1.7–7.7)
Neutrophils Relative %: 58 % (ref 43–77)
PLATELETS: 277 10*3/uL (ref 150–400)
RBC: 4.2 MIL/uL — AB (ref 4.22–5.81)
RDW: 14 % (ref 11.5–15.5)
WBC: 8.3 10*3/uL (ref 4.0–10.5)

## 2013-10-07 LAB — POCT GLYCOSYLATED HEMOGLOBIN (HGB A1C): Hemoglobin A1C: 5.3

## 2013-10-07 MED ORDER — HYDROCHLOROTHIAZIDE 12.5 MG PO TABS: 12.5000 mg | ORAL_TABLET | Freq: Every day | ORAL | Status: DC

## 2013-10-07 MED ORDER — HYDRALAZINE HCL 25 MG PO TABS: 50.0000 mg | ORAL_TABLET | Freq: Two times a day (BID) | ORAL | Status: DC

## 2013-10-07 MED ORDER — ISOSORBIDE DINITRATE 20 MG PO TABS: 20.0000 mg | ORAL_TABLET | Freq: Two times a day (BID) | ORAL | Status: DC

## 2013-10-07 MED ORDER — LISINOPRIL 40 MG PO TABS: 40.0000 mg | ORAL_TABLET | Freq: Every day | ORAL | Status: DC

## 2013-10-07 NOTE — Progress Notes (Signed)
Pt is following up on his HTN. Pt reports having a sickness for 2 weeks. He states that he is having a hard time hearing.

## 2013-10-07 NOTE — Patient Instructions (Signed)
DASH Diet  The DASH diet stands for "Dietary Approaches to Stop Hypertension." It is a healthy eating plan that has been shown to reduce high blood pressure (hypertension) in as little as 14 days, while also possibly providing other significant health benefits. These other health benefits include reducing the risk of breast cancer after menopause and reducing the risk of type 2 diabetes, heart disease, colon cancer, and stroke. Health benefits also include weight loss and slowing kidney failure in patients with chronic kidney disease.   DIET GUIDELINES  · Limit salt (sodium). Your diet should contain less than 1500 mg of sodium daily.  · Limit refined or processed carbohydrates. Your diet should include mostly whole grains. Desserts and added sugars should be used sparingly.  · Include small amounts of heart-healthy fats. These types of fats include nuts, oils, and tub margarine. Limit saturated and trans fats. These fats have been shown to be harmful in the body.  CHOOSING FOODS   The following food groups are based on a 2000 calorie diet. See your Registered Dietitian for individual calorie needs.  Grains and Grain Products (6 to 8 servings daily)  · Eat More Often: Whole-wheat bread, Kneale rice, whole-grain or wheat pasta, quinoa, popcorn without added fat or salt (air popped).  · Eat Less Often: White bread, white pasta, white rice, cornbread.  Vegetables (4 to 5 servings daily)  · Eat More Often: Fresh, frozen, and canned vegetables. Vegetables may be raw, steamed, roasted, or grilled with a minimal amount of fat.  · Eat Less Often/Avoid: Creamed or fried vegetables. Vegetables in a cheese sauce.  Fruit (4 to 5 servings daily)  · Eat More Often: All fresh, canned (in natural juice), or frozen fruits. Dried fruits without added sugar. One hundred percent fruit juice (½ cup [237 mL] daily).  · Eat Less Often: Dried fruits with added sugar. Canned fruit in light or heavy syrup.  Lean Meats, Fish, and Poultry (2  servings or less daily. One serving is 3 to 4 oz [85-114 g]).  · Eat More Often: Ninety percent or leaner ground beef, tenderloin, sirloin. Round cuts of beef, chicken breast, turkey breast. All fish. Grill, bake, or broil your meat. Nothing should be fried.  · Eat Less Often/Avoid: Fatty cuts of meat, turkey, or chicken leg, thigh, or wing. Fried cuts of meat or fish.  Dairy (2 to 3 servings)  · Eat More Often: Low-fat or fat-free milk, low-fat plain or light yogurt, reduced-fat or part-skim cheese.  · Eat Less Often/Avoid: Milk (whole, 2%). Whole milk yogurt. Full-fat cheeses.  Nuts, Seeds, and Legumes (4 to 5 servings per week)  · Eat More Often: All without added salt.  · Eat Less Often/Avoid: Salted nuts and seeds, canned beans with added salt.  Fats and Sweets (limited)  · Eat More Often: Vegetable oils, tub margarines without trans fats, sugar-free gelatin. Mayonnaise and salad dressings.  · Eat Less Often/Avoid: Coconut oils, palm oils, butter, stick margarine, cream, half and half, cookies, candy, pie.  FOR MORE INFORMATION  The Dash Diet Eating Plan: www.dashdiet.org  Document Released: 08/31/2011 Document Revised: 12/04/2011 Document Reviewed: 08/31/2011  ExitCare® Patient Information ©2014 [employer].

## 2013-10-08 ENCOUNTER — Ambulatory Visit: Payer: Medicare Other

## 2013-10-08 ENCOUNTER — Ambulatory Visit: Payer: Medicare Other | Admitting: Occupational Therapy

## 2013-10-08 LAB — CMP AND LIVER
ALBUMIN: 3.9 g/dL (ref 3.5–5.2)
AST: 10 U/L (ref 0–37)
Alkaline Phosphatase: 73 U/L (ref 39–117)
BILIRUBIN DIRECT: 0.1 mg/dL (ref 0.0–0.3)
BUN: 24 mg/dL — ABNORMAL HIGH (ref 6–23)
CALCIUM: 10.5 mg/dL (ref 8.4–10.5)
CHLORIDE: 105 meq/L (ref 96–112)
CO2: 29 meq/L (ref 19–32)
Creat: 1.85 mg/dL — ABNORMAL HIGH (ref 0.50–1.35)
Glucose, Bld: 94 mg/dL (ref 70–99)
Indirect Bilirubin: 0.2 mg/dL (ref 0.0–0.9)
Potassium: 4.3 mEq/L (ref 3.5–5.3)
Sodium: 140 mEq/L (ref 135–145)
Total Bilirubin: 0.3 mg/dL (ref 0.3–1.2)
Total Protein: 6.3 g/dL (ref 6.0–8.3)

## 2013-10-08 LAB — URINALYSIS, COMPLETE
BILIRUBIN URINE: NEGATIVE
Casts: NONE SEEN
Crystals: NONE SEEN
Glucose, UA: NEGATIVE mg/dL
HGB URINE DIPSTICK: NEGATIVE
Ketones, ur: NEGATIVE mg/dL
Leukocytes, UA: NEGATIVE
Nitrite: NEGATIVE
PROTEIN: NEGATIVE mg/dL
SQUAMOUS EPITHELIAL / LPF: NONE SEEN
Specific Gravity, Urine: 1.015 (ref 1.005–1.030)
Urobilinogen, UA: 0.2 mg/dL (ref 0.0–1.0)
pH: 6 (ref 5.0–8.0)

## 2013-10-08 LAB — LIPID PANEL
CHOL/HDL RATIO: 3.4 ratio
CHOLESTEROL: 138 mg/dL (ref 0–200)
HDL: 41 mg/dL (ref >39)
LDL Cholesterol: 77 mg/dL (ref 0–99)
Triglycerides: 98 mg/dL (ref <150)
VLDL: 20 mg/dL (ref 0–40)

## 2013-10-10 ENCOUNTER — Other Ambulatory Visit: Payer: Self-pay | Admitting: Internal Medicine

## 2013-10-10 DIAGNOSIS — I10 Essential (primary) hypertension: Secondary | ICD-10-CM

## 2013-10-10 MED ORDER — ISOSORBIDE MONONITRATE ER 60 MG PO TB24: 60.0000 mg | ORAL_TABLET | Freq: Every day | ORAL | Status: DC

## 2013-10-13 ENCOUNTER — Ambulatory Visit: Payer: Medicare Other

## 2013-10-13 ENCOUNTER — Ambulatory Visit: Payer: Medicare Other | Admitting: Occupational Therapy

## 2013-10-13 ENCOUNTER — Ambulatory Visit: Payer: Medicare Other | Admitting: Physical Therapy

## 2013-10-14 ENCOUNTER — Ambulatory Visit: Payer: Medicare Other | Admitting: Occupational Therapy

## 2013-10-14 ENCOUNTER — Encounter: Payer: Medicare Other | Admitting: Diagnostic Neuroimaging

## 2013-10-14 ENCOUNTER — Ambulatory Visit: Payer: Medicare Other

## 2013-10-15 NOTE — Progress Notes (Signed)
Patient ID: Darrell [name], male   DOB: 06-Feb-1951, 63 y.o.   MRN: [id number] Patient Demographics  Darrell [name], is a 63 y.o. male  [id number]  [id number]  DOB - October 12, 1950  Chief Complaint  Patient presents with  . Follow-up        Subjective:   Darrell [name] is a 63 y.o. male here today for a follow up visit. Patient has history of hypertension, dyslipidemia, cerebrovascular accident and chronic kidney disease, here for medication refills. Doing well, no new complaint except for flulike symptoms for the past 2 weeks which is now better. He is compliant with medications, reports no side effects. He continues to smoke cigarette one pack per day despite repeated counseling, drinks alcohol occasionally. Patient has No headache, No chest pain, No abdominal pain - No Nausea, No new weakness tingling or numbness, No Cough - SOB.  ALLERGIES: Allergies  Allergen Reactions  . Atacand Hct [Candesartan Cilexetil-Hctz] Hives  . Shellfish Allergy Hives    PAST MEDICAL HISTORY: Past Medical History  Diagnosis Date  . Hypertension   . Stroke   . High cholesterol     MEDICATIONS AT HOME: Prior to Admission medications   Medication Sig Start Date End Date Taking? Authorizing Provider  aspirin EC 81 MG tablet Take 81 mg by mouth at bedtime.   Yes Historical Provider, MD  esomeprazole (NEXIUM) 40 MG capsule Take 40 mg by mouth daily before breakfast.   Yes Historical Provider, MD  hydrALAZINE (APRESOLINE) 25 MG tablet Take 2 tablets (50 mg total) by mouth 2 (two) times daily. 10/07/13  Yes Angelica Chessman, MD  hydrochlorothiazide (HYDRODIURIL) 12.5 MG tablet Take 1 tablet (12.5 mg total) by mouth daily. 10/07/13  Yes Angelica Chessman, MD  isosorbide dinitrate (ISORDIL) 20 MG tablet Take 1 tablet (20 mg total) by mouth 2 (two) times daily. 10/07/13  Yes Angelica Chessman, MD  lisinopril (PRINIVIL,ZESTRIL) 40 MG tablet Take 1 tablet (40 mg total) by mouth daily. 10/07/13  Yes Angelica Chessman, MD  Multiple Vitamins-Minerals (MULTIVITAMIN WITH MINERALS) tablet Take 1 tablet by mouth daily.   Yes Historical Provider, MD  hydrALAZINE (APRESOLINE) 25 MG tablet Take 2 tablets (50 mg total) by mouth 2 (two) times daily. 10/07/13   Reyne Dumas, MD  isosorbide mononitrate (IMDUR) 60 MG 24 hr tablet Take 1 tablet (60 mg total) by mouth daily. 10/10/13   Angelica Chessman, MD     Objective:   Filed Vitals:   10/07/13 1300  BP: 168/81  Pulse: 73  Temp: 98.1 F (36.7 C)  TempSrc: Oral  Resp: 16  SpO2: 99%    Exam General appearance : Awake, alert, not in any distress. Speech Clear. Not toxic looking HEENT: Atraumatic and Normocephalic, pupils equally reactive to light and accomodation Neck: supple, no JVD. No cervical lymphadenopathy.  Chest:Good air entry bilaterally, no added sounds  CVS: S1 S2 regular, no murmurs.  Abdomen: Bowel sounds present, Non tender and not distended with no gaurding, rigidity or rebound. Extremities: B/L Lower Ext shows no edema, both legs are warm to touch Neurology: Awake alert, and oriented X 3, CN II-XII intact, Non focal Skin:No Rash Wounds:N/A   Data Review   CBC No results found for this basename: WBC, HGB, HCT, PLT, MCV, MCH, MCHC, RDW, NEUTRABS, LYMPHSABS, MONOABS, EOSABS, BASOSABS, BANDABS, BANDSABD,  in the last 168 hours  Chemistries   No results found for this basename: NA, K, CL, CO2, GLUCOSE, BUN, CREATININE, GFRCGP, CALCIUM, MG, AST, ALT, ALKPHOS, BILITOT,  in  the last 168 hours ------------------------------------------------------------------------------------------------------------------ No results found for this basename: HGBA1C,  in the last 72 hours ------------------------------------------------------------------------------------------------------------------ No results found for this basename: CHOL, HDL, LDLCALC, TRIG, CHOLHDL, LDLDIRECT,  in the last 72  hours ------------------------------------------------------------------------------------------------------------------ No results found for this basename: TSH, T4TOTAL, FREET3, T3FREE, THYROIDAB,  in the last 72 hours ------------------------------------------------------------------------------------------------------------------ No results found for this basename: VITAMINB12, FOLATE, FERRITIN, TIBC, IRON, RETICCTPCT,  in the last 72 hours  Coagulation profile  No results found for this basename: INR, PROTIME,  in the last 168 hours    Assessment & Plan   1. Essential hypertension, benign  - lisinopril (PRINIVIL,ZESTRIL) 40 MG tablet; Take 1 tablet (40 mg total) by mouth daily.  Dispense: 90 tablet; Refill: 3 - isosorbide dinitrate (ISORDIL) 20 MG tablet; Take 1 tablet (20 mg total) by mouth 2 (two) times daily.  Dispense: 180 tablet; Refill: 3 - hydrochlorothiazide (HYDRODIURIL) 12.5 MG tablet; Take 1 tablet (12.5 mg total) by mouth daily.  Dispense: 90 tablet; Refill: 3 - hydrALAZINE (APRESOLINE) 25 MG tablet; Take 2 tablets (50 mg total) by mouth 2 (two) times daily.  Dispense: 360 tablet; Refill: 3  - CMP and Liver - Urinalysis, Complete - CBC with Differential - Lipid panel  2. Cerebrovascular disease Stable  3. Prediabetes  - POCT glycosylated hemoglobin (Hb A1C) is 5.3% today  Patient was counseled extensively on smoking cessation Patient counseled extensively about nutrition and exercise  Follow up in 3 months or when necessary   The patient was given clear instructions to go to ER or return to medical center if symptoms don't improve, worsen or new problems develop. The patient verbalized understanding. The patient was told to call to get lab results if they haven't heard anything in the next week.    Angelica Chessman, MD, [place], Karilyn Cota [facility] and [facility] [place], [place]

## 2013-10-16 ENCOUNTER — Ambulatory Visit: Payer: Medicare Other | Admitting: Physical Therapy

## 2013-10-16 ENCOUNTER — Ambulatory Visit: Payer: Medicare Other | Admitting: Occupational Therapy

## 2013-10-18 ENCOUNTER — Telehealth: Payer: Self-pay | Admitting: Emergency Medicine

## 2013-10-18 NOTE — Telephone Encounter (Signed)
Message copied by Ricci Barker on Sat Oct 18, 2013 10:38 AM ------      Message from: Tresa Garter      Created: Fri Oct 17, 2013  5:12 PM       Please inform patient that his lab results are mostly normal except that there is no significant improvement in the kidney function compared to 6 months ago and his hemoglobin is slightly low which could be from the low kidney functions. His cholesterol levels were normal ------

## 2013-10-18 NOTE — Telephone Encounter (Signed)
Left message for pt to call clinic for test results

## 2013-10-18 NOTE — Telephone Encounter (Signed)
Pt given lab results. Verbalized understanding

## 2013-10-20 ENCOUNTER — Encounter (INDEPENDENT_AMBULATORY_CARE_PROVIDER_SITE_OTHER): Payer: Self-pay | Admitting: Radiology

## 2013-10-20 ENCOUNTER — Telehealth: Payer: Self-pay | Admitting: Diagnostic Neuroimaging

## 2013-10-20 ENCOUNTER — Ambulatory Visit (INDEPENDENT_AMBULATORY_CARE_PROVIDER_SITE_OTHER): Payer: Medicare Other | Admitting: Diagnostic Neuroimaging

## 2013-10-20 DIAGNOSIS — Z0289 Encounter for other administrative examinations: Secondary | ICD-10-CM

## 2013-10-20 DIAGNOSIS — G609 Hereditary and idiopathic neuropathy, unspecified: Secondary | ICD-10-CM

## 2013-10-20 DIAGNOSIS — R269 Unspecified abnormalities of gait and mobility: Secondary | ICD-10-CM

## 2013-10-20 DIAGNOSIS — R471 Dysarthria and anarthria: Secondary | ICD-10-CM

## 2013-10-20 DIAGNOSIS — I639 Cerebral infarction, unspecified: Secondary | ICD-10-CM

## 2013-10-20 DIAGNOSIS — R531 Weakness: Secondary | ICD-10-CM

## 2013-10-20 DIAGNOSIS — R253 Fasciculation: Secondary | ICD-10-CM

## 2013-10-20 DIAGNOSIS — R292 Abnormal reflex: Secondary | ICD-10-CM

## 2013-10-20 NOTE — Telephone Encounter (Signed)
Patient's wife at check out today mentioned that she would like to get an order for patient to get a wheel chair (lightweight). Please call patient's wife.

## 2013-10-20 NOTE — Procedures (Signed)
   [facility]  NCS (NERVE CONDUCTION STUDY) WITH EMG (ELECTROMYOGRAPHY) REPORT   STUDY DATE: 10/20/13 PATIENT NAME: Darrell [name] DOB: [birth year] MRN: [id number]  ORDERING CLINICIAN: Andrey Spearman, MD   TECHNOLOGIST: Towana Badger ELECTROMYOGRAPHER: Earlean Polka. Caia Lofaro, MD  CLINICAL INFORMATION: 63 year old male with dysphagia, dysarthria, upper and lower extremity weakness and numbness.  FINDINGS: NERVE CONDUCTION STUDY: Motor response has prolonged distal latency (6.7 ms) decreased amplitude, slow conduction velocity (44 m/s) and absent F-wave latency. Left median motor responses prolonged distal latency, normal amplitude, slow conduction velocity and prolonged F-wave latency (40 ms). Right ulnar motor response has normal distal latency, normal amplitude, slow conduction velocity (44 m/s proximally, 39 m/s proximal to the elbow, 46 m/s distal to the elbow), and prolonged F-wave latency. Left ulnar motor responses normal distal latency, amplitude, slow conduction velocity with stimulation proximally and above the elbow, and prolonged F-wave latency. Bilateral peroneal and tibial motor responses have normal distal latencies, decreased amplitudes, normal conduction velocities and prolonged F-wave latencies.  Right median sensory response could not be obtained. Left median sensory response has normal amplitude and slow conduction velocity (29 m/s). Bilateral ulnar sensory responses have decreased amplitudes and slow conduction velocities (right 46 m second, left 44 m/s). Bilateral sural sensory responses have normal amplitudes and slow conduction velocities (right 33 m/s, left 38 m/s).  NEEDLE ELECTROMYOGRAPHY: Needle examination of selected muscles of the left upper and lower extremities (deltoid, biceps, triceps, flexor carpi radialis, first dose interosseous, vastus medialis, tibialis anterior, gastrocnemius) shows no abnormal spontaneous activity at rest and decreased motor  unit recruitment in the left biceps, triceps, flexor carpi radialis, tibialis anterior and gastrocnemius muscles.  IMPRESSION:  Abnormal study demonstrating: 1. 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 Abnormal F-wave latencies (including absent F-waves and slowing in the demyelinating range in some nerves) raises possibility of concomitant polyradiculopathy. Considerations include chronic immune/inflammatory neuropathies, such as CIDP. 2. No evidence of myopathy at this time.   INTERPRETING PHYSICIAN:  Penni Bombard, MD Certified in Neurology, Neurophysiology and Neuroimaging  [facility] [address] [place], [place] [address] [phone]

## 2013-10-20 NOTE — Telephone Encounter (Signed)
Rx for DME signed. Let patient know. -VRP

## 2013-10-21 ENCOUNTER — Ambulatory Visit: Payer: Medicare Other | Admitting: Occupational Therapy

## 2013-10-21 ENCOUNTER — Ambulatory Visit: Payer: Medicare Other

## 2013-10-21 ENCOUNTER — Ambulatory Visit: Payer: Medicare Other | Admitting: Physical Therapy

## 2013-10-21 NOTE — Telephone Encounter (Signed)
Called patient and left message stating that the Rx for DME has been signed and if he has any other problems, questions or concerns to call the office.

## 2013-10-22 LAB — NEUROPATHY PANEL
A/G RATIO SPE: 1.4 (ref 0.7–2.0)
ANA: NEGATIVE
Albumin ELP: 4.1 g/dL (ref 3.2–5.6)
Alpha 1: 0.2 g/dL (ref 0.1–0.4)
Alpha 2: 0.9 g/dL (ref 0.4–1.2)
Angio Convert Enzyme: <14 U/L — ABNORMAL LOW (ref 14–82)
Beta: 1.1 g/dL (ref 0.6–1.3)
GAMMA GLOBULIN: 0.8 g/dL (ref 0.5–1.6)
GLOBULIN, TOTAL: 3 g/dL (ref 2.0–4.5)
Rhuematoid fact SerPl-aCnc: 9.6 IU/mL (ref 0.0–13.9)
SED RATE: 5 mm/h (ref 0–30)
TSH: 1.13 u[IU]/mL (ref 0.450–4.500)
Total Protein: 7.1 g/dL (ref 6.0–8.5)
Vit D, 25-Hydroxy: 31.2 ng/mL (ref 30.0–100.0)
Vitamin B-12: 664 pg/mL (ref 211–946)

## 2013-10-24 ENCOUNTER — Ambulatory Visit: Payer: Medicare Other

## 2013-10-24 ENCOUNTER — Ambulatory Visit: Payer: Medicare Other | Admitting: Occupational Therapy

## 2013-10-24 ENCOUNTER — Ambulatory Visit: Payer: Medicare Other | Admitting: Physical Therapy

## 2013-10-27 ENCOUNTER — Ambulatory Visit: Payer: Medicare Other | Admitting: Occupational Therapy

## 2013-10-27 ENCOUNTER — Ambulatory Visit: Payer: Medicare Other | Admitting: Physical Therapy

## 2013-10-27 ENCOUNTER — Ambulatory Visit: Payer: Medicare Other

## 2013-10-28 ENCOUNTER — Telehealth: Payer: Self-pay | Admitting: *Deleted

## 2013-10-28 NOTE — Telephone Encounter (Signed)
I called and spoke to wife re: prescription for lightweight wheelchair placed in mail with [facility] as an option.  She verbalized understanding.

## 2013-10-30 ENCOUNTER — Ambulatory Visit: Payer: Medicare Other | Attending: Diagnostic Neuroimaging | Admitting: Physical Therapy

## 2013-10-30 ENCOUNTER — Other Ambulatory Visit: Payer: Self-pay | Admitting: Diagnostic Neuroimaging

## 2013-10-30 ENCOUNTER — Ambulatory Visit: Payer: Medicare Other | Admitting: Occupational Therapy

## 2013-10-30 ENCOUNTER — Ambulatory Visit: Payer: Medicare Other | Admitting: Speech Pathology

## 2013-10-30 ENCOUNTER — Other Ambulatory Visit (HOSPITAL_COMMUNITY): Payer: Self-pay | Admitting: Diagnostic Neuroimaging

## 2013-10-30 DIAGNOSIS — R1310 Dysphagia, unspecified: Secondary | ICD-10-CM

## 2013-10-30 DIAGNOSIS — R269 Unspecified abnormalities of gait and mobility: Secondary | ICD-10-CM | POA: Diagnosis not present

## 2013-10-30 DIAGNOSIS — R262 Difficulty in walking, not elsewhere classified: Secondary | ICD-10-CM | POA: Diagnosis not present

## 2013-10-30 DIAGNOSIS — IMO0001 Reserved for inherently not codable concepts without codable children: Secondary | ICD-10-CM | POA: Insufficient documentation

## 2013-10-30 DIAGNOSIS — R279 Unspecified lack of coordination: Secondary | ICD-10-CM | POA: Insufficient documentation

## 2013-10-31 ENCOUNTER — Telehealth: Payer: Self-pay | Admitting: Diagnostic Neuroimaging

## 2013-10-31 ENCOUNTER — Other Ambulatory Visit: Payer: Self-pay | Admitting: Diagnostic Neuroimaging

## 2013-10-31 ENCOUNTER — Ambulatory Visit
Admission: RE | Admit: 2013-10-31 | Discharge: 2013-10-31 | Disposition: A | Discharge status: Home / Self Care | Payer: Medicare Other | Source: Ambulatory Visit | Attending: Diagnostic Neuroimaging | Admitting: Diagnostic Neuroimaging

## 2013-10-31 ENCOUNTER — Other Ambulatory Visit (HOSPITAL_COMMUNITY)
Admission: RE | Admit: 2013-10-31 | Discharge: 2013-10-31 | Disposition: A | Discharge status: Home / Self Care | Payer: Medicare Other | Source: Ambulatory Visit | Attending: Diagnostic Neuroimaging | Admitting: Diagnostic Neuroimaging

## 2013-10-31 VITALS — BP 143/95 | HR 81

## 2013-10-31 DIAGNOSIS — R471 Dysarthria and anarthria: Secondary | ICD-10-CM

## 2013-10-31 DIAGNOSIS — G609 Hereditary and idiopathic neuropathy, unspecified: Secondary | ICD-10-CM

## 2013-10-31 DIAGNOSIS — R55 Syncope and collapse: Secondary | ICD-10-CM

## 2013-10-31 DIAGNOSIS — R269 Unspecified abnormalities of gait and mobility: Secondary | ICD-10-CM

## 2013-10-31 DIAGNOSIS — R7303 Prediabetes: Secondary | ICD-10-CM

## 2013-10-31 DIAGNOSIS — I679 Cerebrovascular disease, unspecified: Secondary | ICD-10-CM

## 2013-10-31 DIAGNOSIS — K4090 Unilateral inguinal hernia, without obstruction or gangrene, not specified as recurrent: Secondary | ICD-10-CM

## 2013-10-31 DIAGNOSIS — F1010 Alcohol abuse, uncomplicated: Secondary | ICD-10-CM

## 2013-10-31 DIAGNOSIS — Z720 Tobacco use: Secondary | ICD-10-CM

## 2013-10-31 DIAGNOSIS — I10 Essential (primary) hypertension: Secondary | ICD-10-CM

## 2013-10-31 DIAGNOSIS — R253 Fasciculation: Secondary | ICD-10-CM

## 2013-10-31 DIAGNOSIS — R531 Weakness: Secondary | ICD-10-CM

## 2013-10-31 LAB — CSF CELL COUNT WITH DIFFERENTIAL
RBC COUNT CSF: 2 uL — AB
RBC Count, CSF: 25 cu mm — ABNORMAL HIGH
TUBE #: 4
Tube #: 1
WBC CSF: 5 uL (ref 0–5)
WBC, CSF: 0 cu mm (ref 0–5)

## 2013-10-31 LAB — GLUCOSE, CSF: Glucose, CSF: 62 mg/dL (ref 43–76)

## 2013-10-31 LAB — PROTEIN, CSF: TOTAL PROTEIN, CSF: 63 mg/dL — AB (ref 15–45)

## 2013-10-31 NOTE — Telephone Encounter (Signed)
Patient's wife calling to check on status for order for patient's wheelchair to be faxed over to [place]. Please call and advise.

## 2013-10-31 NOTE — Telephone Encounter (Signed)
Called and left VM message to patient's wife that the order for the wheel chair had been placed in mail -referenced from  telephone note( 10/28/13)

## 2013-10-31 NOTE — Discharge Instructions (Signed)
Lumbar Puncture Discharge Instructions ° °1. Go home and rest quietly for the next 24 hours.  It is important to lie flat for the next 24 hours.  Get up only to go to the restroom.  You may lie in the bed or on a couch on your back, your stomach, your left side or your right side.  You may have one pillow under your head.  You may have pillows between your knees while you are on your side or under your knees while you are on your back. ° °2. DO NOT drive today.  Recline the seat as far back as it will go, while still wearing your seat belt, on the way home. ° °3. You may get up to go to the bathroom as needed.  You may sit up for 10 minutes to eat.  You may resume your normal diet and medications unless otherwise indicated.  Drink plenty of extra fluids today and tomorrow. ° °4. The incidence of a spinal headache with nausea and/or vomiting is about 5% (one in 20 patients).  If you develop a headache, lie flat and drink plenty of fluids until the headache goes away.  Caffeinated beverages may be helpful.  If you develop severe nausea and vomiting or a headache that does not go away with flat bed rest, call [phone]. ° °5. You may resume normal activities after your 24 hours of bed rest is over; however, do not exert yourself strongly or do any heavy lifting tomorrow. ° °6. Call your physician for a follow-up appointment.  °

## 2013-11-03 ENCOUNTER — Ambulatory Visit: Payer: Medicare Other | Admitting: Physical Therapy

## 2013-11-03 ENCOUNTER — Ambulatory Visit: Payer: Medicare Other | Admitting: Occupational Therapy

## 2013-11-03 ENCOUNTER — Ambulatory Visit: Payer: Medicare Other

## 2013-11-03 DIAGNOSIS — IMO0001 Reserved for inherently not codable concepts without codable children: Secondary | ICD-10-CM | POA: Diagnosis not present

## 2013-11-04 LAB — CSF CULTURE W GRAM STAIN: Organism ID, Bacteria: NO GROWTH

## 2013-11-04 LAB — CSF CULTURE
GRAM STAIN: NONE SEEN
Gram Stain: NONE SEEN

## 2013-11-04 LAB — ANGIOTENSIN CONVERTING ENZYME, CSF: ACE, CSF: 5 U/L (ref <=15)

## 2013-11-05 ENCOUNTER — Ambulatory Visit: Payer: Medicare Other | Admitting: Physical Therapy

## 2013-11-05 ENCOUNTER — Ambulatory Visit (HOSPITAL_COMMUNITY)
Admission: RE | Admit: 2013-11-05 | Discharge: 2013-11-05 | Disposition: A | Discharge status: Home / Self Care | Payer: Medicare Other | Source: Ambulatory Visit | Attending: Diagnostic Neuroimaging | Admitting: Diagnostic Neuroimaging

## 2013-11-05 ENCOUNTER — Ambulatory Visit: Payer: Medicare Other | Admitting: Occupational Therapy

## 2013-11-05 ENCOUNTER — Ambulatory Visit: Payer: Medicare Other

## 2013-11-05 DIAGNOSIS — IMO0001 Reserved for inherently not codable concepts without codable children: Secondary | ICD-10-CM | POA: Diagnosis not present

## 2013-11-05 DIAGNOSIS — R1310 Dysphagia, unspecified: Secondary | ICD-10-CM

## 2013-11-05 NOTE — Procedures (Signed)
Objective Swallowing Evaluation: Modified Barium Swallowing Study  Patient Details  Name: Darrell [name] MRN: [id number] Date of Birth: 1951/04/04  Today's Date: 11/05/2013 Time: [id number] SLP Time Calculation (min): 35 min  Past Medical History:  Past Medical History  Diagnosis Date  . High cholesterol   . Hypertension   . Stroke    Past Surgical History:  Past Surgical History  Procedure Laterality Date  . Shoulder surgery Bilateral 1988, 1998   HPI:  63 yo male referred by Dr Leta Baptist for MBS due to concern pt may be aspirating.  PMH + for TBI/ICH in 2011 with left sided weakness, worsening dysphagia/dysarthria recently.  Pt MRI 04/22/13 showed scattered lacunar infarcts bilateral thalamus, right basal ganglia, left anterior parasagittal areas, right centrum semivoal, right parietal  and left frontal microbleeds.  EMG testing completed 10/20/13 showeing widespread sensory and motor axonal polyneuropathy with demylelinating features- no myopathy.  Pt has left sided weakness and requires significant assistance for ambulation.  He also has h/o HTN, ETOH use, smoking, dysphagia s/p EGD, high cholesterol and is borderline diabetic per MD note.   Pt denies weight loss nor pulmonary infections and has not ever required heimlich maneuver.      Assessment / Plan / Recommendation Clinical Impression  Dysphagia Diagnosis: Mild oral phase dysphagia;Mild pharyngeal phase dysphagia (pt has known presbyesophagus)  Clinical impression: Mild oropharyngeal dysphagia noted with sensorimotor characteristics.  Decreased oral control due to weakness resulted in premature spillage of boluses into pharynx.  Liquids reached to pyriform sinus region intermittently with resultant minimal laryngeal penetration.  Pt was taxed to swallow sequential large boluses of thin liquid without aspiration apparent.  Cued throat clear effective to remove trace penetrates.  Chin tuck posture tested to determine if prevented  penetration and decreased to trace amount but not needed at this time in this SLPs opinion.  Pharyngeal swallow was strong without significant stasis except with barium tablet. Barium tablet given with thin lodged at vallelcular space due to decreased oral bolus propulsion  - pudding facilitated clearance.    Advised pt to take his medicines with pudding - whole-  water bolus preceding pudding for oral/pharyngeal/esophageal moisture and after to aid clearance.    SLP educated pt and spouse to results, aspiration precautions and compensation strategies to maximize airway protection using teach back for understanding.  Advised pt to maintain diaphragm strength as much as able for pulmonary clearance.    Pt did not cough during MBS and suspect is experiencing episodic aspiration based on test results.  Advised SMALL single boluses as well, noted pt took large boluses of water after completion of test.    Thanks for referral.       Treatment Recommendation  Other (Comment) (recommend consider short term outpt slp for dysphagia/dysarthria tx)    Diet Recommendation Regular;Thin liquid;Dysphagia 3 (Mechanical Soft) (recommended pt get lower partial repaired for regular diet)   Liquid Administration via: Cup;Straw Medication Administration: Whole meds with puree (start and follow w/water) Supervision: Patient able to self feed Compensations: Slow rate;Small sips/bites;Clear throat intermittently Postural Changes and/or Swallow Maneuvers: Seated upright 90 degrees;Upright 30-60 min after meal    Other  Recommendations Oral Care Recommendations: Oral care BID           General Date of Onset: 11/05/13 HPI: 63 yo male referred by Dr Leta Baptist for MBS due to concern pt may be aspirating.  PMH + for TBI/ICH in 2011 with left sided weakness, worsening dysphagia/dysarthria recently.  Pt MRI 04/22/13 showed  scattered lacunar infarcts bilateral thalamus, right basal ganglia, left anterior parasagittal areas,  right centrum semivoal, right parietal  and left frontal microbleeds.  EMG testing completed 10/20/13 showeing widespread sensory and motor axonal polyneuropathy with demylelinating features- no myopathy.  Pt has left sided weakness and requires significant assistance for ambulation.  He also has h/o HTN, ETOH use, smoking, dysphagia s/p EGD, high cholesterol and is borderline diabetic per MD note.   Type of Study: Modified Barium Swallowing Study Reason for Referral: Objectively evaluate swallowing function Previous Swallow Assessment: esophagram 03/11/2013 laryngeal penetration without aspiration, presbyesophagus, small sliding type hiatal hernia, smooth focal stricture at distal esophagus just above hiatal hernia Diet Prior to this Study: Regular;Thin liquids Temperature Spikes Noted: No Respiratory Status: Room air Behavior/Cognition: Alert Oral Cavity - Dentition: Dentures, top;Missing dentition (four dentition - cracked partial at home x1 year that pt does not wear) Oral Motor / Sensory Function: Impaired motor;Impaired sensory Oral impairment:  (minimal oral secretions retained, dysarthria) Self-Feeding Abilities: Able to feed self Patient Positioning: Upright in bed Baseline Vocal Quality: Low vocal intensity;Wet Volitional Cough: Strong Volitional Swallow: Able to elicit Anatomy: Within functional limits (appearance of mild osteophytes at C5-C6, C6-C7 ) Pharyngeal Secretions: Not observed secondary MBS (pt with wet vocal quality intermittently t/o session, suspect secretions retained in larynx)    Reason for Referral Objectively evaluate swallowing function   Oral Phase Oral Preparation/Oral Phase Oral Phase: Impaired Oral - Nectar Oral - Nectar Cup: Weak lingual manipulation Oral - Thin Oral - Thin Teaspoon: Weak lingual manipulation Oral - Thin Cup: Weak lingual manipulation Oral - Thin Straw: Weak lingual manipulation Oral - Solids Oral - Puree: Weak lingual manipulation Oral  - Regular: Weak lingual manipulation;Piecemeal swallowing (pt took large bolus) Oral - Pill: Weak lingual manipulation Oral Phase - Comment Oral Phase - Comment: premature spillage into pharynx of liquids due to decreased lingual control, trace oral residuals with liquids   Pharyngeal Phase Pharyngeal Phase Pharyngeal Phase: Impaired Pharyngeal - Nectar Pharyngeal - Nectar Cup: Premature spillage to valleculae;Premature spillage to pyriform sinuses Pharyngeal - Thin Pharyngeal - Thin Teaspoon: Premature spillage to valleculae Pharyngeal - Thin Cup: Premature spillage to valleculae;Penetration/Aspiration during swallow Penetration/Aspiration details (thin cup): Material enters airway, remains ABOVE vocal cords and not ejected out Pharyngeal - Thin Straw: Premature spillage to valleculae;Penetration/Aspiration during swallow Penetration/Aspiration details (thin straw): Material enters airway, remains ABOVE vocal cords and not ejected out Pharyngeal - Solids Pharyngeal - Puree: Premature spillage to valleculae Pharyngeal - Regular: Premature spillage to valleculae Pharyngeal - Pill: Premature spillage to valleculae;Pharyngeal residue - valleculae (barium tablet taken with thin lodged in pharynx, pudding bolus transited tablet into esophagus)  Cervical Esophageal Phase    GO    Cervical Esophageal Phase Cervical Esophageal Phase: Impaired (barium tablet taken with thin appeared to lodge at distal esophagus without pt awareness, liquid swallows facilitate clearance) Cervical Esophageal Phase - Comment Cervical Esophageal Comment: barium tablet taken with thin appeared to lodge at distal esophagus without pt awareness, liquid swallows facilitate clearance, single episode of appearance of MINIMAL backflow of liquid from proximal esophagus into lower pharynx - pt sensed need to clear his throat at that time, ? intermittent difficulty with CP opening/relaxation, did not impede barium flow or  tablet    Functional Assessment Tool Used: mbs, clinical judgement Functional Limitations: Swallowing Swallow Current Status [id number]): At least 20 percent but less than 40 percent impaired, limited or restricted Swallow Goal Status [phone]): At least 20 percent but less than 40 percent  impaired, limited or restricted Swallow Discharge Status [phone]): At least 20 percent but less than 40 percent impaired, limited or restricted    Luanna Salk, [place] [facility] SLP [phone]

## 2013-11-11 ENCOUNTER — Ambulatory Visit: Payer: Medicare Other | Admitting: Occupational Therapy

## 2013-11-11 ENCOUNTER — Ambulatory Visit: Payer: Medicare Other | Admitting: Speech Pathology

## 2013-11-11 ENCOUNTER — Ambulatory Visit: Payer: Medicare Other | Admitting: Physical Therapy

## 2013-11-13 ENCOUNTER — Ambulatory Visit: Payer: Medicare Other | Admitting: Speech Pathology

## 2013-11-13 ENCOUNTER — Ambulatory Visit: Payer: Medicare Other | Admitting: Occupational Therapy

## 2013-11-13 ENCOUNTER — Ambulatory Visit: Payer: Medicare Other | Admitting: Physical Therapy

## 2013-11-17 ENCOUNTER — Telehealth: Payer: Self-pay | Admitting: Diagnostic Neuroimaging

## 2013-11-17 NOTE — Telephone Encounter (Signed)
I called patient and gave results. Possible CIDP (CSF protein 63, abnl EMG). I would like to start IVIG. Patient is coming to OT tomorrow, and I would like to briefly see patient as well. Patient not sure of OT session time (?3pm).   Please contact patient / wife to setup brief visit with me in the after noon on 11/18/13. Ok to [place].  Penni Bombard, MD [id number], 0000000 PM Certified in Neurology, Neurophysiology and Neuroimaging  [facility] [address], [place], [place] [address] [phone]

## 2013-11-18 ENCOUNTER — Ambulatory Visit: Payer: Self-pay | Admitting: Diagnostic Neuroimaging

## 2013-11-18 ENCOUNTER — Ambulatory Visit: Payer: Medicare Other

## 2013-11-18 ENCOUNTER — Ambulatory Visit: Payer: Medicare Other | Admitting: Occupational Therapy

## 2013-11-18 ENCOUNTER — Ambulatory Visit: Payer: Medicare Other | Admitting: Physical Therapy

## 2013-11-18 NOTE — Telephone Encounter (Signed)
Called patient and spoke with patient's wife [name] concerning an appt for the patient. An appt was made for 11/26/13, resch from 11/18/13 per Dr. Leta Baptist. I advised the patient's wife that if the patient has any other problems, questions or concerns to call the office. Patient's wife verbalized understanding.

## 2013-11-18 NOTE — Telephone Encounter (Signed)
Patient's wife calling to reschedule due to the weather. Please call patient's wife to reschedule.

## 2013-11-20 ENCOUNTER — Ambulatory Visit: Payer: Medicare Other | Admitting: Physical Therapy

## 2013-11-25 ENCOUNTER — Ambulatory Visit (INDEPENDENT_AMBULATORY_CARE_PROVIDER_SITE_OTHER): Payer: Medicare Other | Admitting: Diagnostic Neuroimaging

## 2013-11-25 ENCOUNTER — Ambulatory Visit: Payer: Medicare Other | Admitting: Physical Therapy

## 2013-11-25 ENCOUNTER — Encounter: Payer: Medicare Other | Admitting: Speech Pathology

## 2013-11-25 ENCOUNTER — Encounter: Payer: Self-pay | Admitting: Diagnostic Neuroimaging

## 2013-11-25 VITALS — BP 160/89 | HR 80

## 2013-11-25 DIAGNOSIS — R259 Unspecified abnormal involuntary movements: Secondary | ICD-10-CM

## 2013-11-25 DIAGNOSIS — G6181 Chronic inflammatory demyelinating polyneuritis: Secondary | ICD-10-CM

## 2013-11-25 DIAGNOSIS — R5381 Other malaise: Secondary | ICD-10-CM

## 2013-11-25 DIAGNOSIS — R531 Weakness: Secondary | ICD-10-CM

## 2013-11-25 DIAGNOSIS — G609 Hereditary and idiopathic neuropathy, unspecified: Secondary | ICD-10-CM

## 2013-11-25 DIAGNOSIS — R1310 Dysphagia, unspecified: Secondary | ICD-10-CM

## 2013-11-25 DIAGNOSIS — R471 Dysarthria and anarthria: Secondary | ICD-10-CM

## 2013-11-25 DIAGNOSIS — R253 Fasciculation: Secondary | ICD-10-CM

## 2013-11-25 DIAGNOSIS — R5383 Other fatigue: Secondary | ICD-10-CM

## 2013-11-25 DIAGNOSIS — I6350 Cerebral infarction due to unspecified occlusion or stenosis of unspecified cerebral artery: Secondary | ICD-10-CM

## 2013-11-25 NOTE — Progress Notes (Signed)
[facility]  PATIENT: Darrell [name] DOB: June 02, 1951  REFERRING CLINICIAN: Vanita Panda HISTORY FROM: patient and wife REASON FOR VISIT: follow up   HISTORICAL  CHIEF COMPLAINT:  Chief Complaint  Patient presents with  . Follow-up    gait difficulty    HISTORY OF PRESENT ILLNESS:   UPDATE 11/25/13: Since last visit patient had EMG and spinal tap. Findings are suspicious for CIDP. Patient's symptoms have gradually progressed. Continues to have more upper and lower extremity weakness, dysarthria, dysphasia and intermittent muscle twitching. Patient is frustrated today and does not want to take anymore medication. Patient's wife would like to find out more options for treatment.  UPDATE 10/03/13: Since last visit, continues to have left sided weakness, worsening speech and swallow difficulty. Is drinking half pint Etoh per 1-2 weeks. Cigarette use has increased to 2 packs per day.  PRIOR HPI (04/03/13): 63 year old right-handed male with hypertension, hypercholesterolemia, smoking, alcohol abuse, here for evaluation of 8 and balance difficulty for past one year.  2011 patient had left arm and left leg weakness, diagnosed with stroke. Apparently he was admitted to hospital and treated. He had fairly good recovery initial one year after stroke. He required using a walking stick but otherwise was fairly mobile. Unfortunately he did not have good medical followup at that time. Patient was abusing alcohol significantly at that time, ranging from 1 pint up to a fifth of alcohol per day. In the past one to 2 years he has cut down on his drinking, now averaging 1 pint of alcohol per week.  In the past one year, patient's mobility is significantly deteriorated. He also has increasing slurred speech, trouble swallowing, balance and gait difficulty. He continues to fall down. Symptoms are worse when he drinks alcohol. Nowadays he is unable to stand up and walk across a room without  assistance. This is been the case for the past one year.  REVIEW OF SYSTEMS: Full 14 system review of systems performed and notable only for those only as per HPI. Otherwise negative.   ALLERGIES: Allergies  Allergen Reactions  . Atacand Hct [Candesartan Cilexetil-Hctz] Hives  . Shellfish Allergy Hives    HOME MEDICATIONS: Outpatient Prescriptions Prior to Visit  Medication Sig Dispense Refill  . aspirin EC 81 MG tablet Take 81 mg by mouth at bedtime.      Marland Kitchen esomeprazole (NEXIUM) 40 MG capsule Take 40 mg by mouth daily before breakfast.      . hydrALAZINE (APRESOLINE) 25 MG tablet Take 2 tablets (50 mg total) by mouth 2 (two) times daily.  180 tablet  3  . hydrALAZINE (APRESOLINE) 25 MG tablet Take 2 tablets (50 mg total) by mouth 2 (two) times daily.  360 tablet  3  . hydrochlorothiazide (HYDRODIURIL) 12.5 MG tablet Take 1 tablet (12.5 mg total) by mouth daily.  90 tablet  3  . isosorbide dinitrate (ISORDIL) 20 MG tablet Take 1 tablet (20 mg total) by mouth 2 (two) times daily.  180 tablet  3  . isosorbide mononitrate (IMDUR) 60 MG 24 hr tablet Take 1 tablet (60 mg total) by mouth daily.  90 tablet  3  . lisinopril (PRINIVIL,ZESTRIL) 40 MG tablet Take 1 tablet (40 mg total) by mouth daily.  90 tablet  3  . Multiple Vitamins-Minerals (MULTIVITAMIN WITH MINERALS) tablet Take 1 tablet by mouth daily.       No facility-administered medications prior to visit.    PAST MEDICAL HISTORY: Past Medical History  Diagnosis Date  .  High cholesterol   . Hypertension   . Stroke     PAST SURGICAL HISTORY: Past Surgical History  Procedure Laterality Date  . Shoulder surgery Bilateral 1988, 1998    FAMILY HISTORY: Family History  Problem Relation Age of Onset  . Hypertension Mother   . Diabetes Sister   . Hypertension Sister     SOCIAL HISTORY:  History   Social History  . Marital Status: Married    Spouse Name: [name]    Number of Children: 1  . Years of Education: college     Occupational History  . retired    Social History Main Topics  . Smoking status: Current Every Day Smoker -- 1.00 packs/day    Types: Cigarettes  . Smokeless tobacco: Never Used  . Alcohol Use: Yes  . Drug Use: No  . Sexual Activity: Not on file   Other Topics Concern  . Not on file   Social History Narrative   Patient lives at home with spouse.   Caffeine Use: 1-3 cups daily     PHYSICAL EXAM  Filed Vitals:   11/25/13 1058  BP: 160/89  Pulse: 80    Not recorded    Cannot calculate BMI with a height equal to zero.  GENERAL EXAM: Patient is in no distress  CARDIOVASCULAR: Regular rate and rhythm, no murmurs, no carotid bruits  NEUROLOGIC: MENTAL STATUS: awake, alert, language fluent, comprehension intact, naming intact CRANIAL NERVE: no papilledema on fundoscopic exam, pupils equal and reactive to light, visual fields full to confrontation, extraocular muscles intact, no nystagmus, facial sensation and strength symmetric, uvula midline, shoulder shrug symmetric, tongue midline. MOD DYSARTHIA. MOD DYSPHAGIA. WET COUGH. CLEARS THROAT CONSTANTLY. TONGUE FASCICULATIONS. MOTOR: FASCICULATIONS BILATERAL CALVES. INCR TONE IN LUE AND LLE. RUE AND RLE 5/5. LUE AND LLE 3-4. BRADYKINETIC IN LUE AND LLE. SENSORY: DECR IN LUE AND LLE COORDINATION: LIMITED IN LEFT SIDE. REFLEXES: 3+ BRISK THROUGHOUT, BUT MORE ON LEFT SIDE THAN RIGHT. GAIT/STATION: LEANS BACK IN CHAIR. CANNOT SIT UPRIGHT. BARELY ABLE TO STAND WITH 2 PERSON ASSIST.   DIAGNOSTIC DATA (LABS, IMAGING, TESTING) - I reviewed patient records, labs, notes, testing and imaging myself where available.  Lab Results  Component Value Date   WBC 8.3 10/07/2013   HGB 12.8* 10/07/2013   HCT 37.6* 10/07/2013   MCV 89.5 10/07/2013   PLT 277 10/07/2013      Component Value Date/Time   NA 140 10/07/2013 1331   K 4.3 10/07/2013 1331   CL 105 10/07/2013 1331   CO2 29 10/07/2013 1331   GLUCOSE 94 10/07/2013 1331   BUN 24*  10/07/2013 1331   CREATININE 1.85* 10/07/2013 1331   CREATININE 1.70* 03/11/2013 1705   CALCIUM 10.5 10/07/2013 1331   PROT 7.1 10/20/2013 1443   PROT 6.3 10/07/2013 1331   ALBUMIN 3.9 10/07/2013 1331   AST 10 10/07/2013 1331   ALT <8 10/07/2013 1331   ALKPHOS 73 10/07/2013 1331   BILITOT 0.3 10/07/2013 1331   GFRNONAA 35* 02/27/2013 1615   GFRAA 40* 02/27/2013 1615   Lab Results  Component Value Date   CHOL 138 10/07/2013   HDL 41 10/07/2013   LDLCALC 77 10/07/2013   TRIG 98 10/07/2013   CHOLHDL 3.4 10/07/2013   Lab Results  Component Value Date   HGBA1C 5.3 10/07/2013   Lab Results  Component Value Date   [id number] 10/20/2013   Lab Results  Component Value Date   TSH 1.130 10/20/2013    02/27/13 CT head -  severe chronic small vessel ischemic disease, periventricular, subcortical, pontine and cerebellar.   04/22/13 MRI BRAIN 1. Scattered chronic lacunar ischemic infarctions in the bilateral thalami, right basal ganglia, right centrum semioval and left anterior parasagittal regions.  2. Moderate chronic small vessel ischemic disease. Few small, chronic cerebral microbleeds in the right parietal and and left frontal regions, also within spectrum of chronic small vessel ischemic disease. 3. No acute findings.  04/22/13 MRI CERVICAL SPINE 1. At C5-6: disc bulging and facet hypertrophy with mild spinal stenosis (AP diameter 67mm) and severe biforaminal foraminal stenosis; no cord signal changes. 2. Multilevel degenerative changes and foraminal stenosis from C2-3 to C6-7 as above.   10/20/13 EMG/NCS 1. 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 Abnormal F-wave latencies (including  absent F-waves and slowing in the demyelinating range in some  nerves) raises possibility of concomitant polyradiculopathy.  Considerations include chronic immune/inflammatory neuropathies,  such as CIDP. 2. No evidence of myopathy at this time.  10/31/13 CSF results: WBC  5-->0, RBC 25-->2, protein 63, glucose 62, ACE 5, cytology negative, culture negative   ASSESSMENT AND PLAN  63 y.o. year old male  has a past medical history of High cholesterol; Hypertension; and Stroke. here with progressive gait and balance or deterioration over the past one year. Also with dysarthria, dysphagia, fasciculations, left sided weakness and hyperreflexia.   Ddx: CIDP, multi-focal cerebrovascular disease, chronic alcoholic neuropathy, cervical myelopathy, motor neuron disease  PLAN: 1. Consider trial of IVIG for possible CIDP; patient will think about it for future, but declines at this time 2. Reviewed possibility of ALS diagnosis, but I recommend IVIG trial first 3. Risk factor mgmt per PCP for cerebrovascular disease 4. Smoking and ETOH reduction/cessation reviewed 5. Continue PT; will add ST and OT evals 6. Conservative mgmt of cervical spinal stenosis (mild at C5-6)  Orders Placed This Encounter  Procedures  . DME Other see comment   Return in about 4 months (around 03/27/2014).    Penni Bombard, MD [id number], [id number] PM Certified in Neurology, Neurophysiology and Neuroimaging  [facility] [address] [place], [place] [phone]

## 2013-11-25 NOTE — Patient Instructions (Signed)
Consider IVIG therapy for CIDP.  Chronic Inflammatory Demyelinating Polyneuropathy Chronic inflammatory demyelinating polyneuropathy (CIDP) is a condition in which damage to nerves results in impaired nerve function. This may cause diminished or unusual sensations, abnormal muscle function, or both. CAUSES  CIDP is caused when the covering of nerves (myelin sheath) is damaged. In the most common form of the disorder, the damage is believed to occur because the immune system (cells that protect the body against foreign invaders, like bacteria and viruses) accidentally attacks the myelin. CIDP may accompany conditions such as blood disorders (Waldenstrom's macroglobulinemia, multiple myeloma, or osteosclerotic myeloma), diabetes, HIV, or hepatitis B or C infection. SYMPTOMS   Numbness or tingling of the hands and feet.  Weakness.  Problems walking.  Weak or absent reflexes. DIAGNOSIS  A physical exam will be done by your health care provider. Also, tests can be done to show how well your nerves are working. These tests include:   Nerve conduction studies.  Electromyography. A nerve biopsy may be done to remove a small piece of a nerve so it can be examined. TREATMENT  Treatment may include:  Steroids or other immunosuppressant drugs. These drugs can decrease inflammation of the nerves.  Immunoglobulin therapy. This decreases your immune system's activity against the nerves.  Blood plasma exchange. This can be done to remove harmful immune system elements from your blood.  Physical therapy. This may help improve symptoms.  Braces or orthotics. These may help support weakened limbs. HOME CARE INSTRUCTIONS   Take all your medicines as directed by your health care provider.  Notify your health care provider if have significant side effects of the drugs. Never stop the medicines without a discussion with your health care provider.  Follow through on physical therapy  recommendations. SEEK MEDICAL CARE IF:  You notice new symptoms.  You notice your symptoms worsening.   Document Released: 06/03/2002 Document Revised: 05/14/2013 Document Reviewed: 03/27/2013 [facility] Patient Information 2014 [place].

## 2013-11-26 ENCOUNTER — Ambulatory Visit: Payer: Medicare Other | Admitting: Speech Pathology

## 2013-11-26 ENCOUNTER — Ambulatory Visit: Payer: Medicare Other | Attending: Diagnostic Neuroimaging | Admitting: Physical Therapy

## 2013-11-26 ENCOUNTER — Ambulatory Visit: Payer: Medicare Other | Admitting: Occupational Therapy

## 2013-11-26 DIAGNOSIS — R279 Unspecified lack of coordination: Secondary | ICD-10-CM | POA: Diagnosis not present

## 2013-11-26 DIAGNOSIS — R269 Unspecified abnormalities of gait and mobility: Secondary | ICD-10-CM | POA: Insufficient documentation

## 2013-11-26 DIAGNOSIS — R262 Difficulty in walking, not elsewhere classified: Secondary | ICD-10-CM | POA: Diagnosis not present

## 2013-11-26 DIAGNOSIS — R471 Dysarthria and anarthria: Secondary | ICD-10-CM | POA: Diagnosis not present

## 2013-11-26 DIAGNOSIS — R1310 Dysphagia, unspecified: Secondary | ICD-10-CM | POA: Diagnosis not present

## 2013-11-26 DIAGNOSIS — Z5189 Encounter for other specified aftercare: Secondary | ICD-10-CM | POA: Insufficient documentation

## 2013-11-26 DIAGNOSIS — Z8673 Personal history of transient ischemic attack (TIA), and cerebral infarction without residual deficits: Secondary | ICD-10-CM | POA: Insufficient documentation

## 2013-11-26 DIAGNOSIS — IMO0001 Reserved for inherently not codable concepts without codable children: Secondary | ICD-10-CM | POA: Diagnosis present

## 2013-11-27 ENCOUNTER — Ambulatory Visit: Payer: Medicare Other | Admitting: Physical Therapy

## 2013-11-27 ENCOUNTER — Ambulatory Visit: Payer: Medicare Other

## 2013-11-27 ENCOUNTER — Ambulatory Visit: Payer: Medicare Other | Admitting: Occupational Therapy

## 2013-12-02 ENCOUNTER — Ambulatory Visit: Payer: Medicare Other | Admitting: Physical Therapy

## 2013-12-02 ENCOUNTER — Ambulatory Visit: Payer: Medicare Other | Admitting: Speech Pathology

## 2013-12-02 ENCOUNTER — Ambulatory Visit: Payer: Medicare Other | Admitting: Occupational Therapy

## 2013-12-02 DIAGNOSIS — Z5189 Encounter for other specified aftercare: Secondary | ICD-10-CM | POA: Diagnosis not present

## 2013-12-04 ENCOUNTER — Ambulatory Visit: Payer: Medicare Other | Admitting: Occupational Therapy

## 2013-12-04 ENCOUNTER — Ambulatory Visit: Payer: Medicare Other

## 2013-12-04 ENCOUNTER — Ambulatory Visit: Payer: Medicare Other | Admitting: Physical Therapy

## 2013-12-04 DIAGNOSIS — Z5189 Encounter for other specified aftercare: Secondary | ICD-10-CM | POA: Diagnosis not present

## 2013-12-09 ENCOUNTER — Ambulatory Visit: Payer: Medicare Other | Admitting: Physical Therapy

## 2013-12-09 ENCOUNTER — Ambulatory Visit: Payer: Medicare Other

## 2013-12-09 DIAGNOSIS — Z5189 Encounter for other specified aftercare: Secondary | ICD-10-CM | POA: Diagnosis not present

## 2013-12-12 ENCOUNTER — Ambulatory Visit: Payer: Medicare Other | Admitting: Physical Therapy

## 2013-12-12 DIAGNOSIS — Z5189 Encounter for other specified aftercare: Secondary | ICD-10-CM | POA: Diagnosis not present

## 2014-01-06 ENCOUNTER — Ambulatory Visit: Payer: Medicare Other | Admitting: Internal Medicine

## 2014-01-14 ENCOUNTER — Ambulatory Visit: Payer: Medicare Other | Admitting: Pharmacist

## 2014-01-20 ENCOUNTER — Ambulatory Visit: Payer: Medicare Other | Admitting: Pharmacist

## 2014-02-05 ENCOUNTER — Ambulatory Visit: Payer: Medicare Other | Admitting: Internal Medicine

## 2014-02-18 ENCOUNTER — Ambulatory Visit: Payer: Medicare Other | Admitting: Internal Medicine

## 2014-02-25 ENCOUNTER — Ambulatory Visit: Payer: Medicare Other | Attending: Internal Medicine | Admitting: Internal Medicine

## 2014-02-25 ENCOUNTER — Encounter: Payer: Self-pay | Admitting: Internal Medicine

## 2014-02-25 VITALS — BP 195/97 | HR 66 | Temp 97.6°F | Resp 16 | Ht 76.0 in | Wt 172.4 lb

## 2014-02-25 DIAGNOSIS — I69993 Ataxia following unspecified cerebrovascular disease: Secondary | ICD-10-CM | POA: Diagnosis not present

## 2014-02-25 DIAGNOSIS — N189 Chronic kidney disease, unspecified: Secondary | ICD-10-CM | POA: Insufficient documentation

## 2014-02-25 DIAGNOSIS — Z7982 Long term (current) use of aspirin: Secondary | ICD-10-CM | POA: Diagnosis not present

## 2014-02-25 DIAGNOSIS — R209 Unspecified disturbances of skin sensation: Secondary | ICD-10-CM | POA: Insufficient documentation

## 2014-02-25 DIAGNOSIS — I69998 Other sequelae following unspecified cerebrovascular disease: Secondary | ICD-10-CM | POA: Diagnosis not present

## 2014-02-25 DIAGNOSIS — I1310 Hypertensive heart and chronic kidney disease without heart failure, with stage 1 through stage 4 chronic kidney disease, or unspecified chronic kidney disease: Secondary | ICD-10-CM | POA: Diagnosis not present

## 2014-02-25 DIAGNOSIS — F17200 Nicotine dependence, unspecified, uncomplicated: Secondary | ICD-10-CM | POA: Insufficient documentation

## 2014-02-25 DIAGNOSIS — R5383 Other fatigue: Secondary | ICD-10-CM

## 2014-02-25 DIAGNOSIS — R5381 Other malaise: Secondary | ICD-10-CM | POA: Insufficient documentation

## 2014-02-25 DIAGNOSIS — I10 Essential (primary) hypertension: Secondary | ICD-10-CM

## 2014-02-25 DIAGNOSIS — I6350 Cerebral infarction due to unspecified occlusion or stenosis of unspecified cerebral artery: Secondary | ICD-10-CM

## 2014-02-25 MED ORDER — LISINOPRIL 40 MG PO TABS: 40.0000 mg | ORAL_TABLET | Freq: Every day | ORAL | Status: DC

## 2014-02-25 MED ORDER — ISOSORBIDE MONONITRATE ER 60 MG PO TB24: 60.0000 mg | ORAL_TABLET | Freq: Every day | ORAL | Status: DC

## 2014-02-25 MED ORDER — CLONIDINE HCL 0.1 MG PO TABS
0.2000 mg | ORAL_TABLET | Freq: Once | ORAL | Status: AC
  Administered 2014-02-25: 0.2 mg via ORAL

## 2014-02-25 MED ORDER — HYDROCHLOROTHIAZIDE 12.5 MG PO TABS: 12.5000 mg | ORAL_TABLET | Freq: Every day | ORAL | Status: DC

## 2014-02-25 MED ORDER — HYDRALAZINE HCL 25 MG PO TABS: 50.0000 mg | ORAL_TABLET | Freq: Three times a day (TID) | ORAL | Status: DC

## 2014-02-25 NOTE — Progress Notes (Signed)
Patient ID: Darrell [name], male   DOB: Oct 24, 1950, 63 y.o.   MRN: [id number]  CC: HTN, CVA  HPI: Patient presents to clinic today for HTN and past CVA f/u.  Patient states that he ran out of his HCTZ and imdur 2 days ago.  Patient denies c/o.  Wants to quit smoking. Quit smoking for 2 months 5 years ago and has not tried since then. Currently smoking 1.5 packs per day. Patient has had significant deterioration in mobility resulting in wheelchair use.  Patient reports difficulty with balance and gait since stroke.      Allergies  Allergen Reactions  . Atacand Hct [Candesartan Cilexetil-Hctz] Hives  . Shellfish Allergy Hives   Past Medical History  Diagnosis Date  . High cholesterol   . Hypertension   . Stroke    Current Outpatient Prescriptions on File Prior to Visit  Medication Sig Dispense Refill  . aspirin EC 81 MG tablet Take 81 mg by mouth at bedtime.      Marland Kitchen esomeprazole (NEXIUM) 40 MG capsule Take 40 mg by mouth daily before breakfast.      . hydrALAZINE (APRESOLINE) 25 MG tablet Take 2 tablets (50 mg total) by mouth 2 (two) times daily.  180 tablet  3  . hydrochlorothiazide (HYDRODIURIL) 12.5 MG tablet Take 1 tablet (12.5 mg total) by mouth daily.  90 tablet  3  . isosorbide mononitrate (IMDUR) 60 MG 24 hr tablet Take 1 tablet (60 mg total) by mouth daily.  90 tablet  3  . lisinopril (PRINIVIL,ZESTRIL) 40 MG tablet Take 1 tablet (40 mg total) by mouth daily.  90 tablet  3  . Multiple Vitamins-Minerals (MULTIVITAMIN WITH MINERALS) tablet Take 1 tablet by mouth daily.      . hydrALAZINE (APRESOLINE) 25 MG tablet Take 2 tablets (50 mg total) by mouth 2 (two) times daily.  360 tablet  3  . isosorbide dinitrate (ISORDIL) 20 MG tablet Take 1 tablet (20 mg total) by mouth 2 (two) times daily.  180 tablet  3   No current facility-administered medications on file prior to visit.   Family History  Problem Relation Age of Onset  . Hypertension Mother   . Diabetes Sister   . Hypertension  Sister    History   Social History  . Marital Status: Married    Spouse Name: [name]    Number of Children: 1  . Years of Education: college   Occupational History  . retired    Social History Main Topics  . Smoking status: Current Every Day Smoker -- 1.00 packs/day    Types: Cigarettes  . Smokeless tobacco: Never Used  . Alcohol Use: Yes  . Drug Use: No  . Sexual Activity: Not on file   Other Topics Concern  . Not on file   Social History Narrative   Patient lives at home with spouse.   Caffeine Use: 1-3 cups daily   Review of Systems  Constitutional: Negative.   Respiratory: Negative.   Cardiovascular: Negative.   Gastrointestinal: Negative.   Musculoskeletal: Negative.   Neurological:       Numbness in right hand.  Mild weakness on left side. Loss of balance s/p CVA     Objective:   Filed Vitals:   02/25/14 1530  BP: 194/100  Pulse: 64  Temp: 97.6 F (36.4 C)  Resp: 16   Physical Exam  Vitals reviewed. Constitutional: He is oriented to person, place, and time.  Neck: Normal range of motion. Neck supple.  No JVD present.  Cardiovascular: Normal rate, regular rhythm and normal heart sounds.   Pulmonary/Chest: Effort normal and breath sounds normal.  Abdominal: Soft. Bowel sounds are normal.  Musculoskeletal: He exhibits no edema and no tenderness.  Patient is weak with balance concerns and is in wheelchair Significant left sided weakness  Lymphadenopathy:    He has no cervical adenopathy.  Neurological: He is alert and oriented to person, place, and time. He has normal reflexes.  Skin: Skin is warm and dry.  Psychiatric: He has a normal mood and affect. His behavior is normal.     Lab Results  Component Value Date   WBC 8.3 10/07/2013   HGB 12.8* 10/07/2013   HCT 37.6* 10/07/2013   MCV 89.5 10/07/2013   PLT 277 10/07/2013   Lab Results  Component Value Date   CREATININE 1.85* 10/07/2013   BUN 24* 10/07/2013   NA 140 10/07/2013   K 4.3 10/07/2013    CL 105 10/07/2013   CO2 29 10/07/2013    Lab Results  Component Value Date   HGBA1C 5.3 10/07/2013   Lipid Panel     Component Value Date/Time   CHOL 138 10/07/2013 1331   TRIG 98 10/07/2013 1331   HDL 41 10/07/2013 1331   CHOLHDL 3.4 10/07/2013 1331   VLDL 20 10/07/2013 1331   LDLCALC 77 10/07/2013 1331       Assessment and plan:  Darrell [name] was seen today for follow-up, hypertension and cerebrovascular accident.  Diagnoses and associated orders for this visit:  Essential hypertension, benign  hydrALAZINE (APRESOLINE) 25 MG tablet; Take 2 tablets (50 mg total) by mouth 3 (three) times daily. - isosorbide mononitrate (IMDUR) 60 MG 24 hr tablet; Take 1 tablet (60 mg total) by mouth daily. - lisinopril (PRINIVIL,ZESTRIL) 40 MG tablet; Take 1 tablet (40 mg total) by mouth daily. - hydrochlorothiazide (HYDRODIURIL) 12.5 MG tablet; Take 1 tablet (12.5 mg total) by mouth daily. Consulted with Dr. Verl Blalock. Will keep current regimen, but change hydralazine to 50 mg TID.  Will come back in one week for BP recheck.  Sent nephrology referral-to decide whether patient should continue lisinopril. Can increase imdur dose if needed to 120 mg on next visit or add amlodipine.  Review of records from Neurologist reveals that patient drinks more than one pint of alcohol weekly.   Chronic kidney disease - cloNIDine (CATAPRES) tablet 0.2 mg; Take 2 tablets (0.2 mg total) by mouth once. - COMPLETE METABOLIC PANEL WITH GFR - Ambulatory referral to Nephrology Tobacco use disorder Stressed the importance of smoking cessation regarding cardiovascular health.    Return in about 1 week (around 03/04/2014) for Nurse Visit-BP check, 1 month PCP.   Lance Bosch, [facility] [phone] 02/26/2014, 2:20 PM

## 2014-02-25 NOTE — Patient Instructions (Signed)
DASH Diet  The DASH diet stands for "Dietary Approaches to Stop Hypertension." It is a healthy eating plan that has been shown to reduce high blood pressure (hypertension) in as little as 14 days, while also possibly providing other significant health benefits. These other health benefits include reducing the risk of breast cancer after menopause and reducing the risk of type 2 diabetes, heart disease, colon cancer, and stroke. Health benefits also include weight loss and slowing kidney failure in patients with chronic kidney disease.   DIET GUIDELINES  · Limit salt (sodium). Your diet should contain less than 1500 mg of sodium daily.  · Limit refined or processed carbohydrates. Your diet should include mostly whole grains. Desserts and added sugars should be used sparingly.  · Include small amounts of heart-healthy fats. These types of fats include nuts, oils, and tub margarine. Limit saturated and trans fats. These fats have been shown to be harmful in the body.  CHOOSING FOODS   The following food groups are based on a 2000 calorie diet. See your Registered Dietitian for individual calorie needs.  Grains and Grain Products (6 to 8 servings daily)  · Eat More Often: Whole-wheat bread, [place] rice, whole-grain or wheat pasta, quinoa, popcorn without added fat or salt (air popped).  · Eat Less Often: White bread, white pasta, white rice, cornbread.  Vegetables (4 to 5 servings daily)  · Eat More Often: Fresh, frozen, and canned vegetables. Vegetables may be raw, steamed, roasted, or grilled with a minimal amount of fat.  · Eat Less Often/Avoid: Creamed or fried vegetables. Vegetables in a cheese sauce.  Fruit (4 to 5 servings daily)  · Eat More Often: All fresh, canned (in natural juice), or frozen fruits. Dried fruits without added sugar. One hundred percent fruit juice (½ cup [237 mL] daily).  · Eat Less Often: Dried fruits with added sugar. Canned fruit in light or heavy syrup.  Lean Meats, Fish, and Poultry (2  servings or less daily. One serving is 3 to 4 oz [85-114 g]).  · Eat More Often: Ninety percent or leaner ground beef, tenderloin, sirloin. Round cuts of beef, chicken breast, turkey breast. All fish. Grill, bake, or broil your meat. Nothing should be fried.  · Eat Less Often/Avoid: Fatty cuts of meat, turkey, or chicken leg, thigh, or wing. Fried cuts of meat or fish.  Dairy (2 to 3 servings)  · Eat More Often: Low-fat or fat-free milk, low-fat plain or light yogurt, reduced-fat or part-skim cheese.  · Eat Less Often/Avoid: Milk (whole, 2%). Whole milk yogurt. Full-fat cheeses.  Nuts, Seeds, and Legumes (4 to 5 servings per week)  · Eat More Often: All without added salt.  · Eat Less Often/Avoid: Salted nuts and seeds, canned beans with added salt.  Fats and Sweets (limited)  · Eat More Often: Vegetable oils, tub margarines without trans fats, sugar-free gelatin. Mayonnaise and salad dressings.  · Eat Less Often/Avoid: Coconut oils, palm oils, butter, stick margarine, cream, half and half, cookies, candy, pie.  FOR MORE INFORMATION  The Dash Diet Eating Plan: www.dashdiet.org  Document Released: 08/31/2011 Document Revised: 12/04/2011 Document Reviewed: 08/31/2011  Exit[employer]® Patient Information ©2014 [employer].

## 2014-02-25 NOTE — Progress Notes (Signed)
Patient is here with wife S/P CVA in 2011. F/U for HTN Referred to social worker for PHQ-9 score of 12. Provider aware. Would like to quit smoking. Wife states he ran out of HCTZ and isosorbide 2 days ago.

## 2014-02-26 LAB — COMPLETE METABOLIC PANEL WITH GFR
ALK PHOS: 76 U/L (ref 39–117)
AST: 11 U/L (ref 0–37)
Albumin: 4.1 g/dL (ref 3.5–5.2)
BUN: 21 mg/dL (ref 6–23)
CO2: 24 mEq/L (ref 19–32)
CREATININE: 1.59 mg/dL — AB (ref 0.50–1.35)
Calcium: 10.8 mg/dL — ABNORMAL HIGH (ref 8.4–10.5)
Chloride: 106 mEq/L (ref 96–112)
GFR, Est African American: 53 mL/min — ABNORMAL LOW
GFR, Est Non African American: 46 mL/min — ABNORMAL LOW
Glucose, Bld: 94 mg/dL (ref 70–99)
POTASSIUM: 4.6 meq/L (ref 3.5–5.3)
Sodium: 138 mEq/L (ref 135–145)
Total Bilirubin: 0.4 mg/dL (ref 0.2–1.2)
Total Protein: 6.8 g/dL (ref 6.0–8.3)

## 2014-03-06 ENCOUNTER — Ambulatory Visit: Payer: Medicare Other | Attending: Internal Medicine

## 2014-03-06 NOTE — Patient Instructions (Signed)
Pt instructed to continue taking medications daily and cut back on smoking. Educated on [place] and smoking cessation Pt will return at next visit for medication f/u

## 2014-03-06 NOTE — Progress Notes (Unsigned)
Patient ID: Darrell [name], male   DOB: 1951-01-23, 63 y.o.   MRN: [id number] Pt comes in for blood pressure recheck after Hydralazine increased to 50 mg  Wife states husband continues to smoke heavy everyday 1 1/2 pack/per day Pt denies headache or chest pain Pt is taking meds daily. Informed he needs to check bp at home

## 2014-03-26 ENCOUNTER — Ambulatory Visit: Payer: Medicare Other | Admitting: Internal Medicine

## 2014-03-30 ENCOUNTER — Encounter: Payer: Self-pay | Admitting: Diagnostic Neuroimaging

## 2014-03-30 ENCOUNTER — Ambulatory Visit (INDEPENDENT_AMBULATORY_CARE_PROVIDER_SITE_OTHER): Payer: Medicare Other | Admitting: Diagnostic Neuroimaging

## 2014-03-30 VITALS — BP 163/95 | HR 72 | Temp 97.3°F

## 2014-03-30 DIAGNOSIS — I6350 Cerebral infarction due to unspecified occlusion or stenosis of unspecified cerebral artery: Secondary | ICD-10-CM

## 2014-03-30 DIAGNOSIS — G6181 Chronic inflammatory demyelinating polyneuritis: Secondary | ICD-10-CM

## 2014-03-30 NOTE — Patient Instructions (Signed)
I will setup home IVIG.  Chronic Inflammatory Demyelinating Polyneuropathy Chronic inflammatory demyelinating polyneuropathy (CIDP) is a condition in which damage to nerves results in impaired nerve function. This may cause diminished or unusual sensations, abnormal muscle function, or both. CAUSES  CIDP is caused when the covering of nerves (myelin sheath) is damaged. In the most common form of the disorder, the damage is believed to occur because the immune system (cells that protect the body against foreign invaders, like bacteria and viruses) accidentally attacks the myelin. CIDP may accompany conditions such as blood disorders (Waldenstrom's macroglobulinemia, multiple myeloma, or osteosclerotic myeloma), diabetes, HIV, or hepatitis B or C infection. SYMPTOMS   Numbness or tingling of the hands and feet.  Weakness.  Problems walking.  Weak or absent reflexes. DIAGNOSIS  A physical exam will be done by your health care provider. Also, tests can be done to show how well your nerves are working. These tests include:   Nerve conduction studies.  Electromyography. A nerve biopsy may be done to remove a small piece of a nerve so it can be examined. TREATMENT  Treatment may include:  Steroids or other immunosuppressant drugs. These drugs can decrease inflammation of the nerves.  Immunoglobulin therapy. This decreases your immune system's activity against the nerves.  Blood plasma exchange. This can be done to remove harmful immune system elements from your blood.  Physical therapy. This may help improve symptoms.  Braces or orthotics. These may help support weakened limbs. HOME CARE INSTRUCTIONS   Take all your medicines as directed by your health care provider.  Notify your health care provider if have significant side effects of the drugs. Never stop the medicines without a discussion with your health care provider.  Follow through on physical therapy recommendations. SEEK  MEDICAL CARE IF:  You notice new symptoms.  You notice your symptoms worsening.   Document Released: 06/03/2002 Document Revised: 05/14/2013 Document Reviewed: 03/27/2013 [facility] Patient Information 2015 [place], [place]. This information is not intended to replace advice given to you by your health care provider. Make sure you discuss any questions you have with your health care provider.

## 2014-03-30 NOTE — Progress Notes (Signed)
[facility]  PATIENT: Darrell [name] DOB: 22-Apr-1951  REFERRING CLINICIAN: Vanita Panda HISTORY FROM: patient and wife REASON FOR VISIT: follow up   HISTORICAL  CHIEF COMPLAINT:  Chief Complaint  Patient presents with  . Follow-up    dysphagia, gait, stroke    HISTORY OF PRESENT ILLNESS:   UPDATE 03/30/14: Since last visit, patient feels strength is stable. Still with slurred speech, swallow diff, balance diff. Now he is interested in IVIG treatment. Completed PT sessions.  UPDATE 11/25/13: Since last visit patient had EMG and spinal tap. Findings are suspicious for CIDP. Patient's symptoms have gradually progressed. Continues to have more upper and lower extremity weakness, dysarthria, dysphasia and intermittent muscle twitching. Patient is frustrated today and does not want to take anymore medication. Patient's wife would like to find out more options for treatment.  UPDATE 10/03/13: Since last visit, continues to have left sided weakness, worsening speech and swallow difficulty. Is drinking half pint Etoh per 1-2 weeks. Cigarette use has increased to 2 packs per day.  PRIOR HPI (04/03/13): 63 year old right-handed male with hypertension, hypercholesterolemia, smoking, alcohol abuse, here for evaluation of 8 and balance difficulty for past one year.  2011 patient had left arm and left leg weakness, diagnosed with stroke. Apparently he was admitted to hospital and treated. He had fairly good recovery initial one year after stroke. He required using a walking stick but otherwise was fairly mobile. Unfortunately he did not have good medical followup at that time. Patient was abusing alcohol significantly at that time, ranging from 1 pint up to a fifth of alcohol per day. In the past one to 2 years he has cut down on his drinking, now averaging 1 pint of alcohol per week.  In the past one year, patient's mobility is significantly deteriorated. He also has increasing slurred  speech, trouble swallowing, balance and gait difficulty. He continues to fall down. Symptoms are worse when he drinks alcohol. Nowadays he is unable to stand up and walk across a room without assistance. This is been the case for the past one year.  REVIEW OF SYSTEMS: Full 14 system review of systems performed and notable only for those only as per HPI. Otherwise negative.   ALLERGIES: Allergies  Allergen Reactions  . Atacand Hct [Candesartan Cilexetil-Hctz] Hives  . Shellfish Allergy Hives    HOME MEDICATIONS: Outpatient Prescriptions Prior to Visit  Medication Sig Dispense Refill  . aspirin EC 81 MG tablet Take 81 mg by mouth at bedtime.      . brimonidine (ALPHAGAN P) 0.1 % SOLN Place 1 drop into both eyes 2 (two) times daily.      . dorzolamide-timolol (COSOPT) 22.3-6.8 MG/ML ophthalmic solution Place 1 drop into both eyes 2 (two) times daily.      Marland Kitchen esomeprazole (NEXIUM) 40 MG capsule Take 40 mg by mouth daily before breakfast.      . hydrALAZINE (APRESOLINE) 25 MG tablet Take 2 tablets (50 mg total) by mouth 3 (three) times daily.  360 tablet  3  . hydrochlorothiazide (HYDRODIURIL) 12.5 MG tablet Take 1 tablet (12.5 mg total) by mouth daily.  30 tablet  1  . isosorbide mononitrate (IMDUR) 60 MG 24 hr tablet Take 1 tablet (60 mg total) by mouth daily.  90 tablet  3  . latanoprost (XALATAN) 0.005 % ophthalmic solution Place 1 drop into both eyes at bedtime.      Marland Kitchen lisinopril (PRINIVIL,ZESTRIL) 40 MG tablet Take 1 tablet (40 mg total) by  mouth daily.  30 tablet  1  . isosorbide mononitrate (IMDUR) 60 MG 24 hr tablet Take 1 tablet (60 mg total) by mouth daily.  90 tablet  3  . Multiple Vitamins-Minerals (MULTIVITAMIN WITH MINERALS) tablet Take 1 tablet by mouth daily.       No facility-administered medications prior to visit.    PAST MEDICAL HISTORY: Past Medical History  Diagnosis Date  . High cholesterol   . Hypertension   . Stroke     PAST SURGICAL HISTORY: Past Surgical  History  Procedure Laterality Date  . Shoulder surgery Bilateral 1988, 1998    FAMILY HISTORY: Family History  Problem Relation Age of Onset  . Hypertension Mother   . Diabetes Sister   . Hypertension Sister     SOCIAL HISTORY:  History   Social History  . Marital Status: Married    Spouse Name: [name]    Number of Children: 1  . Years of Education: college   Occupational History  . retired    Social History Main Topics  . Smoking status: Current Every Day Smoker -- 1.00 packs/day    Types: Cigarettes  . Smokeless tobacco: Never Used  . Alcohol Use: Yes  . Drug Use: No  . Sexual Activity: Not on file   Other Topics Concern  . Not on file   Social History Narrative   Patient lives at home with spouse.   Caffeine Use: 1-3 cups daily     PHYSICAL EXAM  Filed Vitals:   03/30/14 1428  BP: 163/95  Pulse: 72  Temp: 97.3 F (36.3 C)  TempSrc: Oral    Not recorded    Cannot calculate BMI with a height equal to zero.  GENERAL EXAM: Patient is in no distress  CARDIOVASCULAR: Regular rate and rhythm, no murmurs, no carotid bruits  NEUROLOGIC: MENTAL STATUS: awake, alert, language fluent, comprehension intact, naming intact CRANIAL NERVE: no papilledema on fundoscopic exam, pupils equal and reactive to light, visual fields full to confrontation, extraocular muscles intact, no nystagmus, facial sensation and strength symmetric, uvula midline, shoulder shrug symmetric, tongue midline. MOD DYSARTHIA. MOD DYSPHAGIA. WET COUGH. CLEARS THROAT CONSTANTLY. NO TONGUE FASCICULATIONS. MOTOR: NO FASCICULATIONS. INCR TONE IN LUE AND LLE. RUE AND RLE 5/5. LUE AND LLE 3-4. BRADYKINETIC IN LUE AND LLE. SENSORY: DECR IN LUE AND LLE COORDINATION: LIMITED IN LEFT SIDE. REFLEXES: 3+ BRISK THROUGHOUT, BUT SLIGHTLY MORE ON LEFT SIDE THAN RIGHT. GAIT/STATION: LEANS BACK IN CHAIR. CANNOT SIT UPRIGHT. BARELY ABLE TO STAND WITH 2 PERSON ASSIST.   DIAGNOSTIC DATA (LABS, IMAGING,  TESTING) - I reviewed patient records, labs, notes, testing and imaging myself where available.  Lab Results  Component Value Date   WBC 8.3 10/07/2013   HGB 12.8* 10/07/2013   HCT 37.6* 10/07/2013   MCV 89.5 10/07/2013   PLT 277 10/07/2013      Component Value Date/Time   NA 138 02/25/2014 1622   K 4.6 02/25/2014 1622   CL 106 02/25/2014 1622   CO2 24 02/25/2014 1622   GLUCOSE 94 02/25/2014 1622   BUN 21 02/25/2014 1622   CREATININE 1.59* 02/25/2014 1622   CREATININE 1.70* 03/11/2013 1705   CALCIUM 10.8* 02/25/2014 1622   PROT 6.8 02/25/2014 1622   PROT 7.1 10/20/2013 1443   ALBUMIN 4.1 02/25/2014 1622   AST 11 02/25/2014 1622   ALT <8 02/25/2014 1622   ALKPHOS 76 02/25/2014 1622   BILITOT 0.4 02/25/2014 1622   GFRNONAA 46* 02/25/2014 1622   GFRNONAA 35*  02/27/2013 1615   GFRAA 53* 02/25/2014 1622   GFRAA 40* 02/27/2013 1615   Lab Results  Component Value Date   CHOL 138 10/07/2013   HDL 41 10/07/2013   LDLCALC 77 10/07/2013   TRIG 98 10/07/2013   CHOLHDL 3.4 10/07/2013   Lab Results  Component Value Date   HGBA1C 5.3 10/07/2013   Lab Results  Component Value Date   [id number] 10/20/2013   Lab Results  Component Value Date   TSH 1.130 10/20/2013    02/27/13 CT head - severe chronic small vessel ischemic disease, periventricular, subcortical, pontine and cerebellar.   04/22/13 MRI BRAIN 1. Scattered chronic lacunar ischemic infarctions in the bilateral thalami, right basal ganglia, right centrum semioval and left anterior parasagittal regions.  2. Moderate chronic small vessel ischemic disease. Few small, chronic cerebral microbleeds in the right parietal and and left frontal regions, also within spectrum of chronic small vessel ischemic disease. 3. No acute findings.  04/22/13 MRI CERVICAL SPINE 1. At C5-6: disc bulging and facet hypertrophy with mild spinal stenosis (AP diameter 47mm) and severe biforaminal foraminal stenosis; no cord signal changes. 2. Multilevel degenerative changes and foraminal  stenosis from C2-3 to C6-7 as above.   10/20/13 EMG/NCS 1. 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 Abnormal F-wave latencies (including  absent F-waves and slowing in the demyelinating range in some  nerves) raises possibility of concomitant polyradiculopathy.  Considerations include chronic immune/inflammatory neuropathies,  such as CIDP. 2. No evidence of myopathy at this time.  10/31/13 CSF results: WBC 5-->0, RBC 25-->2, protein 63, glucose 62, ACE 5, cytology negative, culture negative   ASSESSMENT AND PLAN  63 y.o. year old male  has a past medical history of High cholesterol; Hypertension; and Stroke. here with progressive gait and balance or deterioration since 2013. Also with dysarthria, dysphagia, left sided weakness and hyperreflexia.   Ddx: CIDP vs chronic alcoholic neuropathy vs cervical myelopathy vs motor neuron disease  PLAN: 1. Will try trial of IVIG for possible CIDP 2. Reviewed possibility of ALS diagnosis, but I recommend IVIG trial first 3. Risk factor mgmt per PCP for cerebrovascular disease 4. Smoking and ETOH reduction/cessation reviewed 5. Continue PT; will add ST and OT evals 6. Conservative mgmt of cervical spinal stenosis (mild at C5-6)  Orders Placed This Encounter  Procedures  . CBC With differential/Platelet  . Basic Metabolic Panel  . Ambulatory referral to [facility]   Return in about 4 months (around 07/31/2014).    Penni Bombard, MD [id number], [id number] PM Certified in Neurology, Neurophysiology and Neuroimaging  [facility] [address] [place],  [address] [phone]

## 2014-03-31 LAB — BASIC METABOLIC PANEL
BUN / CREAT RATIO: 17 (ref 10–22)
BUN: 33 mg/dL — AB (ref 8–27)
CO2: 24 mmol/L (ref 18–29)
CREATININE: 1.92 mg/dL — AB (ref 0.76–1.27)
Calcium: 11.2 mg/dL — ABNORMAL HIGH (ref 8.6–10.2)
Chloride: 105 mmol/L (ref 97–108)
GFR calc Af Amer: 42 mL/min/{1.73_m2} — ABNORMAL LOW (ref >59)
GFR, EST NON AFRICAN AMERICAN: 36 mL/min/{1.73_m2} — AB (ref >59)
Glucose: 94 mg/dL (ref 65–99)
Potassium: 5.1 mmol/L (ref 3.5–5.2)
Sodium: 142 mmol/L (ref 134–144)

## 2014-03-31 LAB — CBC WITH DIFFERENTIAL
BASOS: 0 %
Basophils Absolute: 0 10*3/uL (ref 0.0–0.2)
Eos: 8 %
Eosinophils Absolute: 0.8 10*3/uL — ABNORMAL HIGH (ref 0.0–0.4)
HEMATOCRIT: 37.6 % (ref 37.5–51.0)
Hemoglobin: 13 g/dL (ref 12.6–17.7)
Immature Grans (Abs): 0 10*3/uL (ref 0.0–0.1)
Immature Granulocytes: 0 %
Lymphocytes Absolute: 3.1 10*3/uL (ref 0.7–3.1)
Lymphs: 32 %
MCH: 30.1 pg (ref 26.6–33.0)
MCHC: 34.6 g/dL (ref 31.5–35.7)
MCV: 87 fL (ref 79–97)
MONOCYTES: 7 %
Monocytes Absolute: 0.7 10*3/uL (ref 0.1–0.9)
NEUTROS ABS: 5.2 10*3/uL (ref 1.4–7.0)
Neutrophils Relative %: 53 %
Platelets: 294 10*3/uL (ref 150–379)
RBC: 4.32 x10E6/uL (ref 4.14–5.80)
RDW: 12.9 % (ref 12.3–15.4)
WBC: 9.9 10*3/uL (ref 3.4–10.8)

## 2014-04-02 ENCOUNTER — Telehealth: Payer: Self-pay | Admitting: *Deleted

## 2014-04-02 NOTE — Telephone Encounter (Signed)
I called and spoke to wife, when pt not available.  Pt has only Medicare Part A and B.  (no med supplement).  Faxed to [facility] for IVIG.  [facility] could not take).  [phone],  [phone]

## 2014-04-03 ENCOUNTER — Telehealth: Payer: Self-pay | Admitting: Diagnostic Neuroimaging

## 2014-04-03 NOTE — Telephone Encounter (Signed)
Please call Sharla at [phone]  ext [phone] for the referral for IVIG.

## 2014-04-03 NOTE — Telephone Encounter (Signed)
I spoke to [place] at [facility] and due to pts insurance (medicare and no med supplement or part D) they cannot do.   [facility] also could not do.  What next?

## 2014-04-03 NOTE — Telephone Encounter (Signed)
I called and LMVM for Sharla that I was returning call.

## 2014-04-07 NOTE — Telephone Encounter (Signed)
Can this be done at [facility]? [facility] or [facility]? -[facility]

## 2014-04-10 NOTE — Telephone Encounter (Addendum)
I called Darrell [name] with [facility] [phone], with federal medicare no PA required.  Speaking with his supervisor, [name], medicare will take care of 80%. Pt resposibility would be 20% since no supplement.  Cost of IVIG?     Darrell [name] given dose IVIG 0.4G/KG daily for 5 days..  Will check with pharmacist about cost and then call me back.  Darrell [name] called back and relayed that cost for 40 gms of IVIG $ 12,800.  This is for IVIG only (infusion not included).  I would relay to pt/wife. May be beneficial for them to get supplement (meds).

## 2014-04-13 ENCOUNTER — Ambulatory Visit: Payer: Medicare Other | Attending: Internal Medicine | Admitting: Internal Medicine

## 2014-04-13 ENCOUNTER — Encounter: Payer: Self-pay | Admitting: Internal Medicine

## 2014-04-13 VITALS — BP 145/88 | HR 90 | Temp 98.1°F | Resp 15

## 2014-04-13 DIAGNOSIS — Z720 Tobacco use: Secondary | ICD-10-CM

## 2014-04-13 DIAGNOSIS — N289 Disorder of kidney and ureter, unspecified: Secondary | ICD-10-CM

## 2014-04-13 DIAGNOSIS — I10 Essential (primary) hypertension: Secondary | ICD-10-CM | POA: Insufficient documentation

## 2014-04-13 DIAGNOSIS — Z7982 Long term (current) use of aspirin: Secondary | ICD-10-CM | POA: Insufficient documentation

## 2014-04-13 DIAGNOSIS — I6350 Cerebral infarction due to unspecified occlusion or stenosis of unspecified cerebral artery: Secondary | ICD-10-CM

## 2014-04-13 DIAGNOSIS — F17200 Nicotine dependence, unspecified, uncomplicated: Secondary | ICD-10-CM | POA: Diagnosis not present

## 2014-04-13 MED ORDER — NICOTINE 21 MG/24HR TD PT24: 21.0000 mg | MEDICATED_PATCH | Freq: Every day | TRANSDERMAL | Status: DC

## 2014-04-13 NOTE — Telephone Encounter (Signed)
I called and LMVM for pt to return call re: IVIG.  Cell # mailbox full.

## 2014-04-13 NOTE — Progress Notes (Signed)
MRN: [id number] Name: Darrell [name]  Sex: male Age: 63 y.o. DOB: 1951/09/21  Allergies: Atacand hct and Shellfish allergy  Chief Complaint  Patient presents with  . Follow-up    HPI: Patient is 63 y.o. male who has to of hypertension and was seen in our office last month for high blood pressure, he was taking imdur hydrochlorothiazide  lisinopril, his hydralazine was increased to 50 mg 3 times a day, today his blood pressure is improved compared to last visit, patient also history of renal insufficiency and has already been referred to nephrology for further evaluation, patient does smoke cigarettes, advised to quit smoking patient wants to try nicotine patch.   Past Medical History  Diagnosis Date  . High cholesterol   . Hypertension   . Stroke     Past Surgical History  Procedure Laterality Date  . Shoulder surgery Bilateral 1988, 1998      Medication List       This list is accurate as of: 04/13/14  2:37 PM.  Always use your most recent med list.               aspirin EC 81 MG tablet  Take 81 mg by mouth at bedtime.     brimonidine 0.1 % Soln  Commonly known as:  ALPHAGAN P  Place 1 drop into both eyes 2 (two) times daily.     dorzolamide-timolol 22.3-6.8 MG/ML ophthalmic solution  Commonly known as:  COSOPT  Place 1 drop into both eyes 2 (two) times daily.     esomeprazole 40 MG capsule  Commonly known as:  NEXIUM  Take 40 mg by mouth daily before breakfast.     hydrALAZINE 25 MG tablet  Commonly known as:  APRESOLINE  Take 2 tablets (50 mg total) by mouth 3 (three) times daily.     hydrochlorothiazide 12.5 MG tablet  Commonly known as:  HYDRODIURIL  Take 1 tablet (12.5 mg total) by mouth daily.     isosorbide mononitrate 60 MG 24 hr tablet  Commonly known as:  IMDUR  Take 1 tablet (60 mg total) by mouth daily.     latanoprost 0.005 % ophthalmic solution  Commonly known as:  XALATAN  Place 1 drop into both eyes at bedtime.     latanoprost  0.005 % ophthalmic solution  Commonly known as:  XALATAN  Place 1 drop into both eyes daily.     lisinopril 40 MG tablet  Commonly known as:  PRINIVIL,ZESTRIL  Take 1 tablet (40 mg total) by mouth daily.     nicotine 21 mg/24hr patch  Commonly known as:  NICODERM CQ  Place 1 patch (21 mg total) onto the skin daily.     ONE DAILY MULTIVITAMIN MEN Tabs  Take 1 tablet by mouth daily.        Meds ordered this encounter  Medications  . nicotine (NICODERM CQ) 21 mg/24hr patch    Sig: Place 1 patch (21 mg total) onto the skin daily.    Dispense:  28 patch    Refill:  0     There is no immunization history on file for this patient.  Family History  Problem Relation Age of Onset  . Hypertension Mother   . Diabetes Sister   . Hypertension Sister     History  Substance Use Topics  . Smoking status: Current Every Day Smoker -- 1.00 packs/day    Types: Cigarettes  . Smokeless tobacco: Never Used  . Alcohol Use:  Yes    Review of Systems   As noted in HPI  Filed Vitals:   04/13/14 1413  BP: 145/88  Pulse: 90  Temp: 98.1 F (36.7 C)  Resp: 15    Physical Exam  Physical Exam  Constitutional:  Patient is in wheelchair not in acute distress   Cardiovascular: Normal rate and regular rhythm.   Pulmonary/Chest: Breath sounds normal. No respiratory distress. He has no wheezes. He has no rales.  Musculoskeletal: He exhibits no edema.    CBC    Component Value Date/Time   WBC 9.9 03/30/2014 1538   WBC 8.3 10/07/2013 1331   RBC 4.32 03/30/2014 1538   RBC 4.20* 10/07/2013 1331   HGB 13.0 03/30/2014 1538   HCT 37.6 03/30/2014 1538   PLT 294 03/30/2014 1538   MCV 87 03/30/2014 1538   LYMPHSABS 3.1 03/30/2014 1538   LYMPHSABS 2.4 10/07/2013 1331   MONOABS 0.7 10/07/2013 1331   EOSABS 0.8* 03/30/2014 1538   EOSABS 0.4 10/07/2013 1331   BASOSABS 0.0 03/30/2014 1538   BASOSABS 0.0 10/07/2013 1331    CMP     Component Value Date/Time   NA 142 03/30/2014 1538   NA 138 02/25/2014 1622    K 5.1 03/30/2014 1538   CL 105 03/30/2014 1538   CO2 24 03/30/2014 1538   GLUCOSE 94 03/30/2014 1538   GLUCOSE 94 02/25/2014 1622   BUN 33* 03/30/2014 1538   BUN 21 02/25/2014 1622   CREATININE 1.92* 03/30/2014 1538   CREATININE 1.59* 02/25/2014 1622   CALCIUM 11.2* 03/30/2014 1538   PROT 6.8 02/25/2014 1622   PROT 7.1 10/20/2013 1443   ALBUMIN 4.1 02/25/2014 1622   AST 11 02/25/2014 1622   ALT <8 02/25/2014 1622   ALKPHOS 76 02/25/2014 1622   BILITOT 0.4 02/25/2014 1622   GFRNONAA 36* 03/30/2014 1538   GFRNONAA 46* 02/25/2014 1622   GFRAA 42* 03/30/2014 1538   GFRAA 53* 02/25/2014 1622    Lab Results  Component Value Date/Time   CHOL 138 10/07/2013  1:31 PM    No components found with this basename: hga1c    Lab Results  Component Value Date/Time   AST 11 02/25/2014  4:22 PM    Assessment and Plan  Essential hypertension, benign  Blood pressure is improved continue with current medications also advise for DASH diet.   Tobacco abuse - Plan:  patient will trynicotine (NICODERM CQ) 21 mg/24hr patch  Renal insufficiency  Patient has already been referred to nephrology    Return in about 3 months (around 07/14/2014) for hypertension.  Lorayne Marek, MD

## 2014-04-13 NOTE — Progress Notes (Signed)
Patient here for follow up on his hypertension

## 2014-04-13 NOTE — Patient Instructions (Signed)
DASH Eating Plan  DASH stands for "Dietary Approaches to Stop Hypertension." The DASH eating plan is a healthy eating plan that has been shown to reduce high blood pressure (hypertension). Additional health benefits may include reducing the risk of type 2 diabetes mellitus, heart disease, and stroke. The DASH eating plan may also help with weight loss.  WHAT DO I NEED TO KNOW ABOUT THE DASH EATING PLAN?  For the DASH eating plan, you will follow these general guidelines:  · Choose foods with a percent daily value for sodium of less than 5% (as listed on the food label).  · Use salt-free seasonings or herbs instead of table salt or sea salt.  · Check with your health care provider or pharmacist before using salt substitutes.  · Eat lower-sodium products, often labeled as "lower sodium" or "no salt added."  · Eat fresh foods.  · Eat more vegetables, fruits, and low-fat dairy products.  · Choose whole grains. Look for the word "whole" as the first word in the ingredient list.  · Choose fish and skinless chicken or turkey more often than red meat. Limit fish, poultry, and meat to 6 oz (170 g) each day.  · Limit sweets, desserts, sugars, and sugary drinks.  · Choose heart-healthy fats.  · Limit cheese to 1 oz (28 g) per day.  · Eat more home-cooked food and less restaurant, buffet, and fast food.  · Limit fried foods.  · Cook foods using methods other than frying.  · Limit canned vegetables. If you do use them, rinse them well to decrease the sodium.  · When eating at a restaurant, ask that your food be prepared with less salt, or no salt if possible.  WHAT FOODS CAN I EAT?  Seek help from a dietitian for individual calorie needs.  Grains  Whole grain or whole wheat bread. Summerall rice. Whole grain or whole wheat pasta. Quinoa, bulgur, and whole grain cereals. Low-sodium cereals. Corn or whole wheat flour tortillas. Whole grain cornbread. Whole grain crackers. Low-sodium crackers.  Vegetables  Fresh or frozen vegetables  (raw, steamed, roasted, or grilled). Low-sodium or reduced-sodium tomato and vegetable juices. Low-sodium or reduced-sodium tomato sauce and paste. Low-sodium or reduced-sodium canned vegetables.   Fruits  All fresh, canned (in natural juice), or frozen fruits.  Meat and Other Protein Products  Ground beef (85% or leaner), grass-fed beef, or beef trimmed of fat. Skinless chicken or turkey. Ground chicken or turkey. Pork trimmed of fat. All fish and seafood. Eggs. Dried beans, peas, or lentils. Unsalted nuts and seeds. Unsalted canned beans.  Dairy  Low-fat dairy products, such as skim or 1% milk, 2% or reduced-fat cheeses, low-fat ricotta or cottage cheese, or plain low-fat yogurt. Low-sodium or reduced-sodium cheeses.  Fats and Oils  Tub margarines without trans fats. Light or reduced-fat mayonnaise and salad dressings (reduced sodium). Avocado. Safflower, olive, or canola oils. Natural peanut or almond butter.  Other  Unsalted popcorn and pretzels.  The items listed above may not be a complete list of recommended foods or beverages. Contact your dietitian for more options.  WHAT FOODS ARE NOT RECOMMENDED?  Grains  White bread. White pasta. White rice. Refined cornbread. Bagels and croissants. Crackers that contain trans fat.  Vegetables  Creamed or fried vegetables. Vegetables in a cheese sauce. Regular canned vegetables. Regular canned tomato sauce and paste. Regular tomato and vegetable juices.  Fruits  Dried fruits. Canned fruit in light or heavy syrup. Fruit juice.  Meat and Other Protein   Products  Fatty cuts of meat. Ribs, chicken wings, bacon, sausage, bologna, salami, chitterlings, fatback, hot dogs, bratwurst, and packaged luncheon meats. Salted nuts and seeds. Canned beans with salt.  Dairy  Whole or 2% milk, cream, half-and-half, and cream cheese. Whole-fat or sweetened yogurt. Full-fat cheeses or blue cheese. Nondairy creamers and whipped toppings. Processed cheese, cheese spreads, or cheese  curds.  Condiments  Onion and garlic salt, seasoned salt, table salt, and sea salt. Canned and packaged gravies. Worcestershire sauce. Tartar sauce. Barbecue sauce. Teriyaki sauce. Soy sauce, including reduced sodium. Steak sauce. Fish sauce. Oyster sauce. Cocktail sauce. Horseradish. Ketchup and mustard. Meat flavorings and tenderizers. Bouillon cubes. Hot sauce. Tabasco sauce. Marinades. Taco seasonings. Relishes.  Fats and Oils  Butter, stick margarine, lard, shortening, ghee, and bacon fat. Coconut, palm kernel, or palm oils. Regular salad dressings.  Other  Pickles and olives. Salted popcorn and pretzels.  The items listed above may not be a complete list of foods and beverages to avoid. Contact your dietitian for more information.  WHERE CAN I FIND MORE INFORMATION?  National Heart, Lung, and Blood Institute: www.nhlbi.nih.gov/health/health-topics/topics/dash/  Document Released: 08/31/2011 Document Revised: 09/16/2013 Document Reviewed: 07/16/2013  ExitCare® Patient Information ©2015 [employer]. This information is not intended to replace advice given to you by your health care provider. Make sure you discuss any questions you have with your health care provider.

## 2014-04-15 NOTE — Telephone Encounter (Signed)
I called and spoke to [place], wife of pt.  Relayed that I did speak to husband and he would think about cost and what to do.   I told her that I have a IVIG demonstration study that is in progress (I hope still taking pts) re: IVIG and HH therapy and Medicare.  I filled out form with  Dr. Gladstone Lighter signature also pts signature. Then will fax to them and see if accepted.   If so [employer] to be contacted to deliver IVIG.  She said it was ok to wait, that way can get fax # for signatures.

## 2014-04-27 NOTE — Telephone Encounter (Signed)
I called and LMVM for wife, [name], relating to needing fax # for [employer] to get form to them and get Lacie to sign for the IVIG.  Once complete then can fax to [place] for [employer] and [employer].

## 2014-04-30 ENCOUNTER — Telehealth: Payer: Self-pay | Admitting: Diagnostic Neuroimaging

## 2014-04-30 NOTE — Telephone Encounter (Signed)
Darrell [name] with [facility] @ [phone], checking status of authorization form faxed on 04/29/14 for back and knee brace.  Please call and advise.  Thanks

## 2014-05-01 NOTE — Telephone Encounter (Signed)
Spoke to [place] from [facility]. Will fax form to [phone] for Dr. Leta Baptist to review and sign.

## 2014-05-04 ENCOUNTER — Other Ambulatory Visit: Payer: Self-pay | Admitting: Internal Medicine

## 2014-05-04 NOTE — Telephone Encounter (Signed)
I called and LMVM for Darrell [name], wife to return call.  Two things: one is fax number for her to get signature re: study for IVIG, the other is received faxes for braces (order?).

## 2014-05-05 ENCOUNTER — Other Ambulatory Visit: Payer: Self-pay | Admitting: Internal Medicine

## 2014-05-05 NOTE — Telephone Encounter (Signed)
Spoke to spouse [name]. She states the patient did order the back and need brace. Advised will have Dr. Leta Baptist sign and fax back to [facility].

## 2014-06-06 IMAGING — RF DG FLUORO GUIDE LUMBAR PUNCTURE
1 series · 1 of 1 positions shown · non-contrast
Comparison: none

CLINICAL DATA: Left-sided weakness

[Series 1: dg fluoro guide lumbar puncture · 1 of 1 slices shown]
[im 1/1]
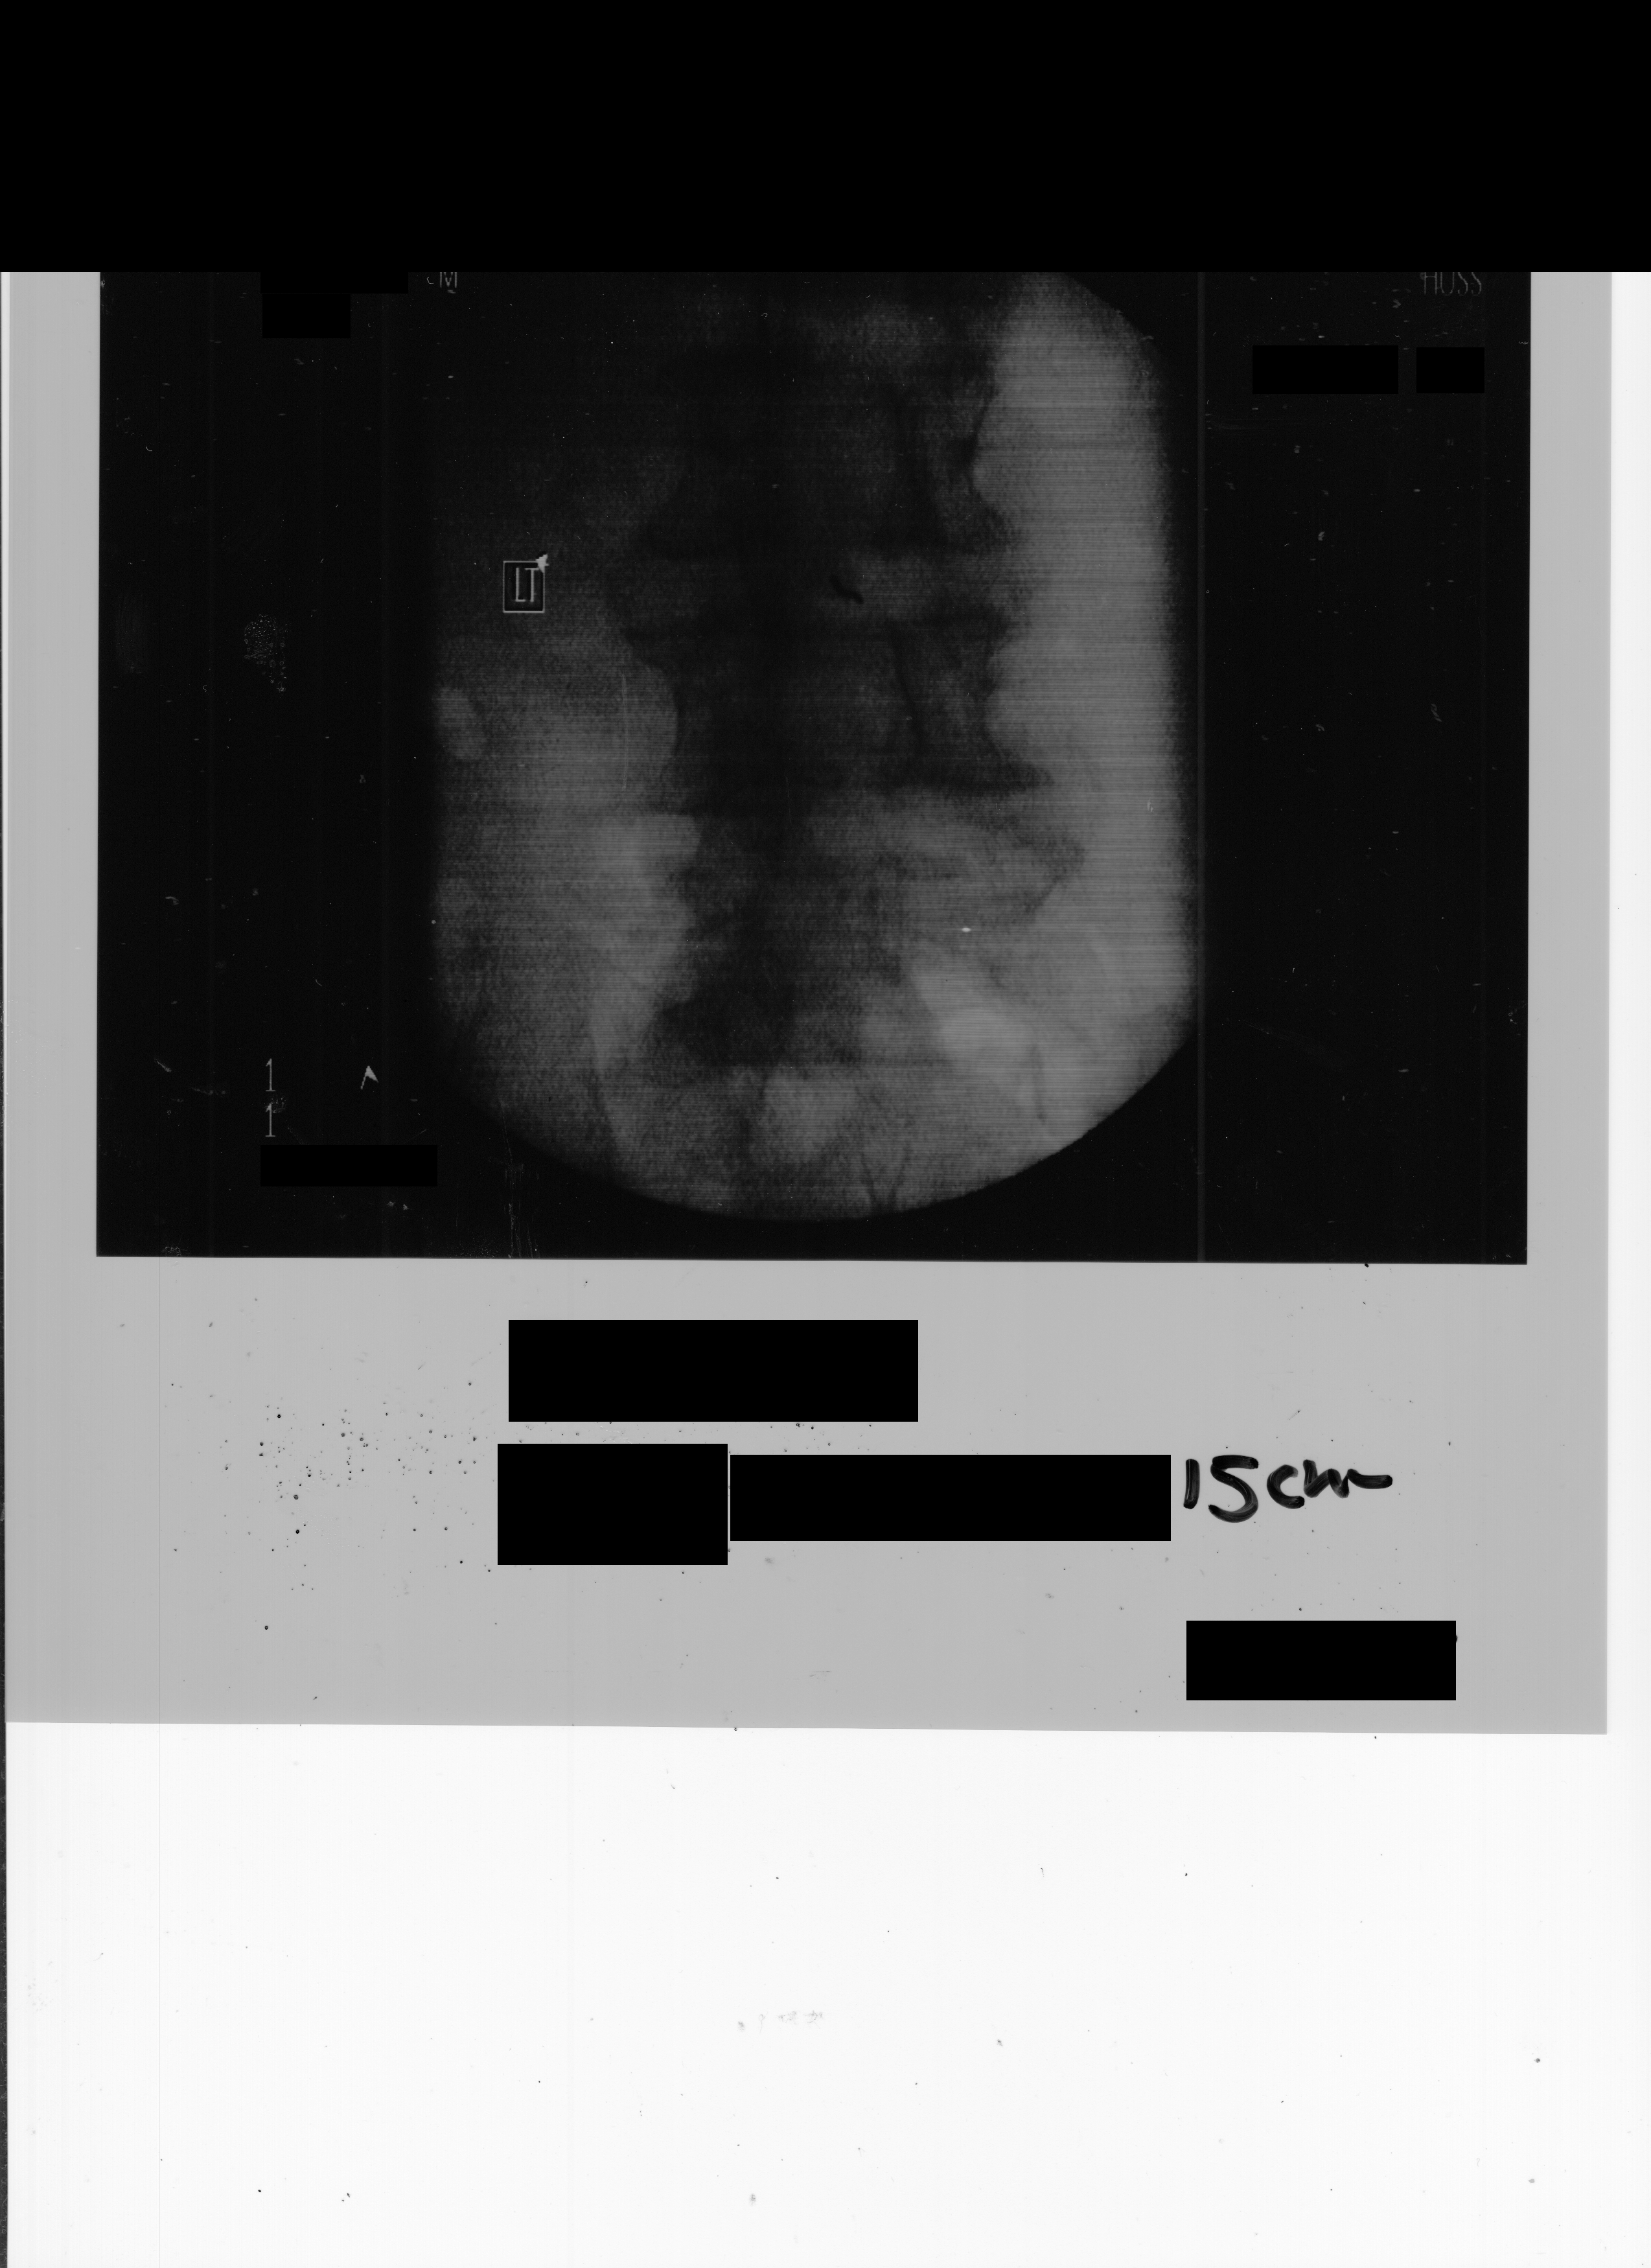

[1 of 1 positions shown; findings below may reference images not displayed]

EXAM:
DIAGNOSTIC LUMBAR PUNCTURE UNDER FLUOROSCOPIC GUIDANCE

FLUOROSCOPY TIME:  37 seconds.

PROCEDURE:
Informed consent was obtained from the patient prior to the
procedure, including potential complications of headache, allergy,
and pain. With the patient prone, the lower back was prepped with
Betadine. 1% Lidocaine was used for local anesthesia. Lumbar
puncture was performed at the L3-4 level using a 20 gauge needle
with return of clear CSF with an opening pressure of 15 cm water.
Fourteenml of CSF were obtained for laboratory studies. The patient
tolerated the procedure well and there were no apparent
complications.
IMPRESSION: Successful lumbar puncture for pressure measurements and fluid
analysis.

## 2014-07-01 ENCOUNTER — Telehealth: Payer: Self-pay

## 2014-07-01 NOTE — Telephone Encounter (Signed)
Spoke to patient's wife. Called and scheduled follow up appt per RN-Sandy's request. Spouse said there is no rush to schedule follow up as directed in Dr. Gladstone Lighter last [facility] note in Nov. Scheduled appt.

## 2014-07-10 ENCOUNTER — Other Ambulatory Visit: Payer: Self-pay

## 2014-07-14 ENCOUNTER — Other Ambulatory Visit: Payer: Self-pay | Admitting: Internal Medicine

## 2014-08-05 ENCOUNTER — Ambulatory Visit: Payer: Self-pay | Admitting: Diagnostic Neuroimaging

## 2014-08-11 ENCOUNTER — Ambulatory Visit: Payer: Self-pay | Admitting: Diagnostic Neuroimaging

## 2014-09-04 ENCOUNTER — Ambulatory Visit: Payer: Medicare Other | Admitting: Internal Medicine

## 2014-09-21 ENCOUNTER — Ambulatory Visit (HOSPITAL_BASED_OUTPATIENT_CLINIC_OR_DEPARTMENT_OTHER): Payer: Medicare Other

## 2014-09-21 ENCOUNTER — Encounter: Payer: Self-pay | Admitting: Internal Medicine

## 2014-09-21 ENCOUNTER — Ambulatory Visit: Payer: Medicare Other | Attending: Internal Medicine | Admitting: Internal Medicine

## 2014-09-21 VITALS — BP 150/91 | HR 85 | Temp 98.0°F | Resp 16

## 2014-09-21 DIAGNOSIS — K4090 Unilateral inguinal hernia, without obstruction or gangrene, not specified as recurrent: Secondary | ICD-10-CM | POA: Diagnosis not present

## 2014-09-21 DIAGNOSIS — Z7982 Long term (current) use of aspirin: Secondary | ICD-10-CM | POA: Insufficient documentation

## 2014-09-21 DIAGNOSIS — Z720 Tobacco use: Secondary | ICD-10-CM

## 2014-09-21 DIAGNOSIS — Z91013 Allergy to seafood: Secondary | ICD-10-CM | POA: Insufficient documentation

## 2014-09-21 DIAGNOSIS — Z8673 Personal history of transient ischemic attack (TIA), and cerebral infarction without residual deficits: Secondary | ICD-10-CM | POA: Diagnosis not present

## 2014-09-21 DIAGNOSIS — N189 Chronic kidney disease, unspecified: Secondary | ICD-10-CM

## 2014-09-21 DIAGNOSIS — I129 Hypertensive chronic kidney disease with stage 1 through stage 4 chronic kidney disease, or unspecified chronic kidney disease: Secondary | ICD-10-CM | POA: Diagnosis not present

## 2014-09-21 DIAGNOSIS — Z79899 Other long term (current) drug therapy: Secondary | ICD-10-CM | POA: Diagnosis not present

## 2014-09-21 DIAGNOSIS — Z0000 Encounter for general adult medical examination without abnormal findings: Secondary | ICD-10-CM

## 2014-09-21 DIAGNOSIS — Z888 Allergy status to other drugs, medicaments and biological substances status: Secondary | ICD-10-CM | POA: Insufficient documentation

## 2014-09-21 DIAGNOSIS — Z23 Encounter for immunization: Secondary | ICD-10-CM

## 2014-09-21 DIAGNOSIS — E780 Pure hypercholesterolemia: Secondary | ICD-10-CM | POA: Diagnosis not present

## 2014-09-21 DIAGNOSIS — I639 Cerebral infarction, unspecified: Secondary | ICD-10-CM

## 2014-09-21 DIAGNOSIS — I10 Essential (primary) hypertension: Secondary | ICD-10-CM

## 2014-09-21 MED ORDER — ISOSORBIDE MONONITRATE ER 60 MG PO TB24: 60.0000 mg | ORAL_TABLET | Freq: Every day | ORAL | Status: DC

## 2014-09-21 MED ORDER — HYDRALAZINE HCL 25 MG PO TABS: 25.0000 mg | ORAL_TABLET | Freq: Two times a day (BID) | ORAL | Status: DC

## 2014-09-21 MED ORDER — LISINOPRIL 40 MG PO TABS: 40.0000 mg | ORAL_TABLET | Freq: Every day | ORAL | Status: DC

## 2014-09-21 NOTE — Progress Notes (Signed)
Patient is here for his routine follow up on his HTN and renal insufficiency Also complains of cyst in groin area has gotten bigger

## 2014-09-21 NOTE — Progress Notes (Signed)
MRN: [id number] Name: Darrell [name]  Sex: male Age: 63 y.o. DOB: 05/11/1951  Allergies: Atacand hct and Shellfish allergy  Chief Complaint  Patient presents with  . Follow-up    HPI: Patient is 63 y.o. male who history of hypertension, CVA currently following up with her neurologist, comes today requesting refill on blood pressure medication, he also has history of CK D. And was seen by nephrologist, EMR reviewed patient was advised to continue with lisinopril, Imdur, amlodipine and hydralazine, patient brought the medication bottles he had a prescription from his nephrologist for amlodipine, today is requesting refill on hydralazine, apparently hydrochlorothiazide was discontinued, today's blood pressure is borderline elevated denies any headache dizziness chest and shortness of breath, has occasional cough denies any fever chills does still smoke cigarettes, I have encouraged patient to quit smoking.Patient reported to have right inguinal hernia for almost 2 years and now have noticed increase in the size denies any pain nausea vomiting.  Past Medical History  Diagnosis Date  . High cholesterol   . Hypertension   . Stroke     Past Surgical History  Procedure Laterality Date  . Shoulder surgery Bilateral 1988, 1998      Medication List       This list is accurate as of: 09/21/14  5:21 PM.  Always use your most recent med list.               aspirin EC 81 MG tablet  Take 81 mg by mouth at bedtime.     brimonidine 0.1 % Soln  Commonly known as:  ALPHAGAN P  Place 1 drop into both eyes 2 (two) times daily.     dorzolamide-timolol 22.3-6.8 MG/ML ophthalmic solution  Commonly known as:  COSOPT  Place 1 drop into both eyes 2 (two) times daily.     esomeprazole 40 MG capsule  Commonly known as:  NEXIUM  Take 40 mg by mouth daily before breakfast.     hydrALAZINE 25 MG tablet  Commonly known as:  APRESOLINE  Take 1 tablet (25 mg total) by mouth 2 (two) times daily.       isosorbide mononitrate 60 MG 24 hr tablet  Commonly known as:  IMDUR  Take 1 tablet (60 mg total) by mouth daily.     latanoprost 0.005 % ophthalmic solution  Commonly known as:  XALATAN  Place 1 drop into both eyes at bedtime.     latanoprost 0.005 % ophthalmic solution  Commonly known as:  XALATAN  Place 1 drop into both eyes daily.     lisinopril 40 MG tablet  Commonly known as:  PRINIVIL,ZESTRIL  Take 1 tablet (40 mg total) by mouth daily.     nicotine 21 mg/24hr patch  Commonly known as:  NICODERM CQ  Place 1 patch (21 mg total) onto the skin daily.     ONE DAILY MULTIVITAMIN MEN Tabs  Take 1 tablet by mouth daily.        Meds ordered this encounter  Medications  . isosorbide mononitrate (IMDUR) 60 MG 24 hr tablet    Sig: Take 1 tablet (60 mg total) by mouth daily.    Dispense:  90 tablet    Refill:  3  . lisinopril (PRINIVIL,ZESTRIL) 40 MG tablet    Sig: Take 1 tablet (40 mg total) by mouth daily.    Dispense:  30 tablet    Refill:  3  . hydrALAZINE (APRESOLINE) 25 MG tablet    Sig: Take 1  tablet (25 mg total) by mouth 2 (two) times daily.    Dispense:  360 tablet    Refill:  3     There is no immunization history on file for this patient.  Family History  Problem Relation Age of Onset  . Hypertension Mother   . Diabetes Sister   . Hypertension Sister     History  Substance Use Topics  . Smoking status: Current Every Day Smoker -- 1.00 packs/day    Types: Cigarettes  . Smokeless tobacco: Never Used  . Alcohol Use: Yes    Review of Systems   As noted in HPI  Filed Vitals:   09/21/14 1649  BP: 150/91  Pulse: 85  Temp: 98 F (36.7 C)  Resp: 16    Physical Exam  Physical Exam  Constitutional: No distress.  Patient is in wheelchair not in acute distress    Cardiovascular: Normal rate and regular rhythm.   Pulmonary/Chest: Breath sounds normal. No respiratory distress. He has no wheezes. He has no rales.  Abdominal:  Lump in the  groin nontender  Musculoskeletal: He exhibits no edema.    CBC    Component Value Date/Time   WBC 9.9 03/30/2014 1538   WBC 8.3 10/07/2013 1331   RBC 4.32 03/30/2014 1538   RBC 4.20* 10/07/2013 1331   HGB 13.0 03/30/2014 1538   HCT 37.6 03/30/2014 1538   PLT 294 03/30/2014 1538   MCV 87 03/30/2014 1538   LYMPHSABS 3.1 03/30/2014 1538   LYMPHSABS 2.4 10/07/2013 1331   MONOABS 0.7 10/07/2013 1331   EOSABS 0.8* 03/30/2014 1538   EOSABS 0.4 10/07/2013 1331   BASOSABS 0.0 03/30/2014 1538   BASOSABS 0.0 10/07/2013 1331    CMP     Component Value Date/Time   NA 142 03/30/2014 1538   NA 138 02/25/2014 1622   K 5.1 03/30/2014 1538   CL 105 03/30/2014 1538   CO2 24 03/30/2014 1538   GLUCOSE 94 03/30/2014 1538   GLUCOSE 94 02/25/2014 1622   BUN 33* 03/30/2014 1538   BUN 21 02/25/2014 1622   CREATININE 1.92* 03/30/2014 1538   CREATININE 1.59* 02/25/2014 1622   CALCIUM 11.2* 03/30/2014 1538   PROT 6.8 02/25/2014 1622   PROT 7.1 10/20/2013 1443   ALBUMIN 4.1 02/25/2014 1622   AST 11 02/25/2014 1622   ALT <8 02/25/2014 1622   ALKPHOS 76 02/25/2014 1622   BILITOT 0.4 02/25/2014 1622   GFRNONAA 36* 03/30/2014 1538   GFRNONAA 46* 02/25/2014 1622   GFRAA 42* 03/30/2014 1538   GFRAA 53* 02/25/2014 1622    Lab Results  Component Value Date/Time   CHOL 138 10/07/2013 01:31 PM    No components found for: HGA1C  Lab Results  Component Value Date/Time   AST 11 02/25/2014 04:22 PM    Assessment and Plan  Essential hypertension, benign - Plan:patient will continue with isosorbide mononitrate (IMDUR) 60 MG 24 hr tablet, lisinopril (PRINIVIL,ZESTRIL) 40 MG tablet, hydrALAZINE (APRESOLINE) 25 MG tablet, amlodipine, patient is scheduled to follow with nephrologist next month.  Tobacco abuse Advised patient to quit smoking.  Chronic kidney disease, unspecified stage Following up with nephrology.  Unilateral inguinal hernia without obstruction or gangrene, recurrence not  specified - Plan: Ambulatory referral to General Surgery   Health Maintenance  -Vaccinations:  Flu shot and pneumovax today   Return in about 3 months (around 12/21/2014) for hypertension.  Lorayne Marek, MD

## 2014-09-21 NOTE — Patient Instructions (Signed)
Smoking Cessation Quitting smoking is important to your health and has many advantages. However, it is not always easy to quit since nicotine is a very addictive drug. Oftentimes, people try 3 times or more before being able to quit. This document explains the best ways for you to prepare to quit smoking. Quitting takes hard work and a lot of effort, but you can do it. ADVANTAGES OF QUITTING SMOKING  You will live longer, feel better, and live better.  Your body will feel the impact of quitting smoking almost immediately.  Within 20 minutes, blood pressure decreases. Your pulse returns to its normal level.  After 8 hours, carbon monoxide levels in the blood return to normal. Your oxygen level increases.  After 24 hours, the chance of having a heart attack starts to decrease. Your breath, hair, and body stop smelling like smoke.  After 48 hours, damaged nerve endings begin to recover. Your sense of taste and smell improve.  After 72 hours, the body is virtually free of nicotine. Your bronchial tubes relax and breathing becomes easier.  After 2 to 12 weeks, lungs can hold more air. Exercise becomes easier and circulation improves.  The risk of having a heart attack, stroke, cancer, or lung disease is greatly reduced.  After 1 year, the risk of coronary heart disease is cut in half.  After 5 years, the risk of stroke falls to the same as a nonsmoker.  After 10 years, the risk of lung cancer is cut in half and the risk of other cancers decreases significantly.  After 15 years, the risk of coronary heart disease drops, usually to the level of a nonsmoker.  If you are pregnant, quitting smoking will improve your chances of having a healthy baby.  The people you live with, especially any children, will be healthier.  You will have extra money to spend on things other than cigarettes. QUESTIONS TO THINK ABOUT BEFORE ATTEMPTING TO QUIT You may want to talk about your answers with your  health care provider.  Why do you want to quit?  If you tried to quit in the past, what helped and what did not?  What will be the most difficult situations for you after you quit? How will you plan to handle them?  Who can help you through the tough times? Your family? Friends? A health care provider?  What pleasures do you get from smoking? What ways can you still get pleasure if you quit? Here are some questions to ask your health care provider:  How can you help me to be successful at quitting?  What medicine do you think would be best for me and how should I take it?  What should I do if I need more help?  What is smoking withdrawal like? How can I get information on withdrawal? GET READY  Set a quit date.  Change your environment by getting rid of all cigarettes, ashtrays, matches, and lighters in your home, car, or work. Do not let people smoke in your home.  Review your past attempts to quit. Think about what worked and what did not. GET SUPPORT AND ENCOURAGEMENT You have a better chance of being successful if you have help. You can get support in many ways.  Tell your family, friends, and coworkers that you are going to quit and need their support. Ask them not to smoke around you.  Get individual, group, or telephone counseling and support. Programs are available at local hospitals and health centers. Call   your local health department for information about programs in your area.  Spiritual beliefs and practices may help some smokers quit.  Download a "quit meter" on your computer to keep track of quit statistics, such as how long you have gone without smoking, cigarettes not smoked, and money saved.  Get a self-help book about quitting smoking and staying off tobacco. LEARN NEW SKILLS AND BEHAVIORS  Distract yourself from urges to smoke. Talk to someone, go for a walk, or occupy your time with a task.  Change your normal routine. Take a different route to work.  Drink tea instead of coffee. Eat breakfast in a different place.  Reduce your stress. Take a hot bath, exercise, or read a book.  Plan something enjoyable to do every day. Reward yourself for not smoking.  Explore interactive web-based programs that specialize in helping you quit. GET MEDICINE AND USE IT CORRECTLY Medicines can help you stop smoking and decrease the urge to smoke. Combining medicine with the above behavioral methods and support can greatly increase your chances of successfully quitting smoking.  Nicotine replacement therapy helps deliver nicotine to your body without the negative effects and risks of smoking. Nicotine replacement therapy includes nicotine gum, lozenges, inhalers, nasal sprays, and skin patches. Some may be available over-the-counter and others require a prescription.  Antidepressant medicine helps people abstain from smoking, but how this works is unknown. This medicine is available by prescription.  Nicotinic receptor partial agonist medicine simulates the effect of nicotine in your brain. This medicine is available by prescription. Ask your health care provider for advice about which medicines to use and how to use them based on your health history. Your health care provider will tell you what side effects to look out for if you choose to be on a medicine or therapy. Carefully read the information on the package. Do not use any other product containing nicotine while using a nicotine replacement product.  RELAPSE OR DIFFICULT SITUATIONS Most relapses occur within the first 3 months after quitting. Do not be discouraged if you start smoking again. Remember, most people try several times before finally quitting. You may have symptoms of withdrawal because your body is used to nicotine. You may crave cigarettes, be irritable, feel very hungry, cough often, get headaches, or have difficulty concentrating. The withdrawal symptoms are only temporary. They are strongest  when you first quit, but they will go away within 10-14 days. To reduce the chances of relapse, try to:  Avoid drinking alcohol. Drinking lowers your chances of successfully quitting.  Reduce the amount of caffeine you consume. Once you quit smoking, the amount of caffeine in your body increases and can give you symptoms, such as a rapid heartbeat, sweating, and anxiety.  Avoid smokers because they can make you want to smoke.  Do not let weight gain distract you. Many smokers will gain weight when they quit, usually less than 10 pounds. Eat a healthy diet and stay active. You can always lose the weight gained after you quit.  Find ways to improve your mood other than smoking. FOR MORE INFORMATION  www.smokefree.gov  Document Released: 09/05/2001 Document Revised: 01/26/2014 Document Reviewed: 12/21/2011 ExitCare Patient Information 2015 [employer]. This information is not intended to replace advice given to you by your health care provider. Make sure you discuss any questions you have with your health care provider. DASH Eating Plan DASH stands for "Dietary Approaches to Stop Hypertension." The DASH eating plan is a healthy eating plan that has   been shown to reduce high blood pressure (hypertension). Additional health benefits may include reducing the risk of type 2 diabetes mellitus, heart disease, and stroke. The DASH eating plan may also help with weight loss. WHAT DO I NEED TO KNOW ABOUT THE DASH EATING PLAN? For the DASH eating plan, you will follow these general guidelines:  Choose foods with a percent daily value for sodium of less than 5% (as listed on the food label).  Use salt-free seasonings or herbs instead of table salt or sea salt.  Check with your health care provider or pharmacist before using salt substitutes.  Eat lower-sodium products, often labeled as "lower sodium" or "no salt added."  Eat fresh foods.  Eat more vegetables, fruits, and low-fat dairy  products.  Choose whole grains. Look for the word "whole" as the first word in the ingredient list.  Choose fish and skinless chicken or turkey more often than red meat. Limit fish, poultry, and meat to 6 oz (170 g) each day.  Limit sweets, desserts, sugars, and sugary drinks.  Choose heart-healthy fats.  Limit cheese to 1 oz (28 g) per day.  Eat more home-cooked food and less restaurant, buffet, and fast food.  Limit fried foods.  Cook foods using methods other than frying.  Limit canned vegetables. If you do use them, rinse them well to decrease the sodium.  When eating at a restaurant, ask that your food be prepared with less salt, or no salt if possible. WHAT FOODS CAN I EAT? Seek help from a dietitian for individual calorie needs. Grains Whole grain or whole wheat bread. Bensen rice. Whole grain or whole wheat pasta. Quinoa, bulgur, and whole grain cereals. Low-sodium cereals. Corn or whole wheat flour tortillas. Whole grain cornbread. Whole grain crackers. Low-sodium crackers. Vegetables Fresh or frozen vegetables (raw, steamed, roasted, or grilled). Low-sodium or reduced-sodium tomato and vegetable juices. Low-sodium or reduced-sodium tomato sauce and paste. Low-sodium or reduced-sodium canned vegetables.  Fruits All fresh, canned (in natural juice), or frozen fruits. Meat and Other Protein Products Ground beef (85% or leaner), grass-fed beef, or beef trimmed of fat. Skinless chicken or turkey. Ground chicken or turkey. Pork trimmed of fat. All fish and seafood. Eggs. Dried beans, peas, or lentils. Unsalted nuts and seeds. Unsalted canned beans. Dairy Low-fat dairy products, such as skim or 1% milk, 2% or reduced-fat cheeses, low-fat ricotta or cottage cheese, or plain low-fat yogurt. Low-sodium or reduced-sodium cheeses. Fats and Oils Tub margarines without trans fats. Light or reduced-fat mayonnaise and salad dressings (reduced sodium). Avocado. Safflower, olive, or canola  oils. Natural peanut or almond butter. Other Unsalted popcorn and pretzels. The items listed above may not be a complete list of recommended foods or beverages. Contact your dietitian for more options. WHAT FOODS ARE NOT RECOMMENDED? Grains White bread. White pasta. White rice. Refined cornbread. Bagels and croissants. Crackers that contain trans fat. Vegetables Creamed or fried vegetables. Vegetables in a cheese sauce. Regular canned vegetables. Regular canned tomato sauce and paste. Regular tomato and vegetable juices. Fruits Dried fruits. Canned fruit in light or heavy syrup. Fruit juice. Meat and Other Protein Products Fatty cuts of meat. Ribs, chicken wings, bacon, sausage, bologna, salami, chitterlings, fatback, hot dogs, bratwurst, and packaged luncheon meats. Salted nuts and seeds. Canned beans with salt. Dairy Whole or 2% milk, cream, half-and-half, and cream cheese. Whole-fat or sweetened yogurt. Full-fat cheeses or blue cheese. Nondairy creamers and whipped toppings. Processed cheese, cheese spreads, or cheese curds. Condiments Onion and garlic salt,   seasoned salt, table salt, and sea salt. Canned and packaged gravies. Worcestershire sauce. Tartar sauce. Barbecue sauce. Teriyaki sauce. Soy sauce, including reduced sodium. Steak sauce. Fish sauce. Oyster sauce. Cocktail sauce. Horseradish. Ketchup and mustard. Meat flavorings and tenderizers. Bouillon cubes. Hot sauce. Tabasco sauce. Marinades. Taco seasonings. Relishes. Fats and Oils Butter, stick margarine, lard, shortening, ghee, and bacon fat. Coconut, palm kernel, or palm oils. Regular salad dressings. Other Pickles and olives. Salted popcorn and pretzels. The items listed above may not be a complete list of foods and beverages to avoid. Contact your dietitian for more information. WHERE CAN I FIND MORE INFORMATION? National Heart, Lung, and Blood Institute: www.nhlbi.nih.gov/health/health-topics/topics/dash/ Document Released:  08/31/2011 Document Revised: 01/26/2014 Document Reviewed: 07/16/2013 ExitCare Patient Information 2015 [employer]. This information is not intended to replace advice given to you by your health care provider. Make sure you discuss any questions you have with your health care provider.  

## 2014-10-12 ENCOUNTER — Ambulatory Visit (INDEPENDENT_AMBULATORY_CARE_PROVIDER_SITE_OTHER): Payer: Self-pay | Admitting: Surgery

## 2014-11-18 NOTE — Progress Notes (Signed)
Please clarify consent order. Abbreviations noted on consent form. Thanks.

## 2014-11-19 ENCOUNTER — Encounter (HOSPITAL_COMMUNITY)
Admission: RE | Admit: 2014-11-19 | Discharge: 2014-11-19 | Disposition: A | Discharge status: Home / Self Care | Payer: Medicare Other | Source: Ambulatory Visit | Attending: Surgery | Admitting: Surgery

## 2014-11-19 ENCOUNTER — Encounter (HOSPITAL_COMMUNITY): Payer: Self-pay

## 2014-11-19 DIAGNOSIS — Z01818 Encounter for other preprocedural examination: Secondary | ICD-10-CM | POA: Insufficient documentation

## 2014-11-19 DIAGNOSIS — K4090 Unilateral inguinal hernia, without obstruction or gangrene, not specified as recurrent: Secondary | ICD-10-CM | POA: Diagnosis not present

## 2014-11-19 HISTORY — DX: Nicotine dependence, unspecified, uncomplicated: F17.200

## 2014-11-19 HISTORY — DX: Dysphagia following unspecified cerebrovascular disease: I69.991

## 2014-11-19 HISTORY — DX: Unspecified glaucoma: H40.9

## 2014-11-19 HISTORY — DX: Chronic kidney disease, unspecified: N18.9

## 2014-11-19 HISTORY — DX: Myoneural disorder, unspecified: G70.9

## 2014-11-19 HISTORY — DX: Gastro-esophageal reflux disease without esophagitis: K21.9

## 2014-11-19 NOTE — Progress Notes (Signed)
Labs per [facility] done 11/17/2014 (CBC and CMP) and also renal panel from 06/2014 faxed today 11/19/2014 to [phone]

## 2014-11-19 NOTE — Progress Notes (Addendum)
[facility] notes and labs for CBC and CMP per chart from 07/08/2014 and 11/17/2014 results faxed to Dr Harlow Asa 11/19/2014  pts wife states Imdur has been discontinued but is noted on both H&P's from [facility] pt is to be taking. This nurse contacted Anna and LM for JoAnne in regards to situation. Return call back number given for PAT dept.

## 2014-11-19 NOTE — Progress Notes (Signed)
Spoke with Lauretta Grill MD / anesthesia in regards to pts dysphagia no orders given anesthesia to see pt day of surgery

## 2014-11-19 NOTE — Progress Notes (Signed)
Notified [facility] also. During PAT visit 11/19/2014 reviewing pts medications pts wife stated Imdur had been discontinued but this nurse has noted per Dr Annitta Needs and Ola H&P's from 06/2014, 08/2014 and 10/2014 all note pt is to continue. Need clarification if pt is to continue this medication. Wife needs also to be made aware if pt is to continue this medication or if has been discontinued.

## 2014-11-19 NOTE — Progress Notes (Signed)
Knot noted to left side of neck pt states MD aware Also noted small white raised area to chest with black dot in center no drainage noted or redness discussed with pt and wife to continue to monitor if any changes contact MD

## 2014-11-19 NOTE — Patient Instructions (Addendum)
[address]  11/19/2014   Your procedure is scheduled on:        Monday November 23, 2014   Report to [facility] Main Entrance and follow signs to  [place] arrive at [place] AM.   Call this number if you have problems the morning of surgery [phone] or Presurgical Testing [phone].   Remember:  Do not eat food or drink liquids :After Midnight.  For Living Will and/or Health Care Power Attorney Forms: please provide copy for your medical record, may bring AM of surgery (forms should be already notarized-we do not provide this service).     Take these medicines the morning of surgery with A SIP OF WATER: Hydralazine;Amlodipine;Nexium; Alphagan (brimonidine) eye gtts (bring day of surgery);Dorzolamide-Timolol (Cosopt) eye gtts (bring day of surgery)                               You may not have any metal on your body including hair pins and piercings  Do not wear jewelry, lotions, powders,colognes or deodorant.  Men may shave face and neck.               Do not bring valuables to the hospital. [place].  Contacts, dentures or bridgework may not be worn into surgery.  Leave suitcase in the car. After surgery it may be brought to your room.  For patients admitted to the hospital, checkout time is 11:00 AM the day of discharge.     ________________________________________________________________________  [facility] - Preparing for Surgery Before surgery, you can play an important role.  Because skin is not sterile, your skin needs to be as free of germs as possible.  You can reduce the number of germs on your skin by washing with CHG (chlorahexidine gluconate) soap before surgery.  CHG is an antiseptic cleaner which kills germs and bonds with the skin to continue killing germs even after washing. Please DO NOT use if you have an allergy to CHG or antibacterial soaps.  If your skin becomes reddened/irritated stop using the CHG  and inform your nurse when you arrive at Short Stay. Do not shave (including legs and underarms) for at least 48 hours prior to the first CHG shower.  You may shave your face/neck. Please follow these instructions carefully:  1.  Shower with CHG Soap the night before surgery and the  morning of Surgery.  2.  If you choose to wash your hair, wash your hair first as usual with your  normal  shampoo.  3.  After you shampoo, rinse your hair and body thoroughly to remove the  shampoo.                           4.  Use CHG as you would any other liquid soap.  You can apply chg directly  to the skin and wash                       Gently with a scrungie or clean washcloth.  5.  Apply the CHG Soap to your body ONLY FROM THE NECK DOWN.   Do not use on face/ open                           Wound or open sores. Avoid  contact with eyes, ears mouth and genitals (private parts).                       Wash face,  Genitals (private parts) with your normal soap.             6.  Wash thoroughly, paying special attention to the area where your surgery  will be performed.  7.  Thoroughly rinse your body with warm water from the neck down.  8.  DO NOT shower/wash with your normal soap after using and rinsing off  the CHG Soap.                9.  Pat yourself dry with a clean towel.            10.  Wear clean pajamas.            11.  Place clean sheets on your bed the night of your first shower and do not  sleep with pets. Day of Surgery : Do not apply any lotions/deodorants the morning of surgery.  Please wear clean clothes to the hospital/surgery center.  FAILURE TO FOLLOW THESE INSTRUCTIONS MAY RESULT IN THE CANCELLATION OF YOUR SURGERY PATIENT SIGNATURE_________________________________  NURSE SIGNATURE__________________________________  ________________________________________________________________________

## 2014-11-20 ENCOUNTER — Encounter (HOSPITAL_COMMUNITY): Payer: Self-pay

## 2014-11-20 NOTE — Progress Notes (Signed)
Final EKG done 11/19/14 in [facility].

## 2014-11-20 NOTE — Progress Notes (Signed)
Your patient has screened at an elevated risk for Obstructive Sleep Apnea using the Stop-Bang Tool during a pre-surgical vist. A score of 4 or greater is an elevated risk. Score of 5.  

## 2014-11-20 NOTE — Progress Notes (Signed)
Quick Note:  EKG is acceptable for scheduled surgery.  Salam Chesterfield M. Radiance Deady, MD, FACS [facility] Office: [phone]   ______ 

## 2014-11-22 ENCOUNTER — Encounter (HOSPITAL_COMMUNITY): Payer: Self-pay | Admitting: Surgery

## 2014-11-22 NOTE — H&P (Signed)
General Surgery [facility]  Kingsley Spittle Owens Shark DOB: 1951-05-07 Married / Language: English / Race: Black or African American Male  History of Present Illness   The patient is a 64 year old male who presents with an inguinal hernia. Patient referred by Dr. Lorayne Marek for incarcerated right inguinal hernia. Patient has had a right inguinal hernia for at least 5 years. His wife states that it has become progressively larger, especially over the past year. Patient has not had any prior hernia repairs. He has had no prior abdominal surgery. Patient does note problems with constipation and is currently on a stool softener. The hernia is no longer reducible. Patient's past medical history includes hypertension and a history of a stroke approximately 4 years ago. He has limited use of his left side and is unable to walk any significant distance. He is able to stand with assistance. Patient presents today to discuss right inguinal hernia repair.   Other Problems  Cerebrovascular Accident Gastroesophageal Reflux Disease High blood pressure Hypercholesterolemia Inguinal Hernia Other disease, cancer, significant illness  Past Surgical History  Cataract Surgery Bilateral. Esophagomyotomy Oral Surgery Shoulder Surgery Bilateral.  Diagnostic Studies History Colonoscopy never  Allergies  Atacand *ANTIHYPERTENSIVES*  Medication History  AmLODIPine Besylate (10MG  Tablet, Oral) Active. Hydrochlorothiazide (12.5MG  Capsule, Oral) Active. HydrALAZINE HCl (25MG  Tablet, Oral) Active. Isosorbide Mononitrate ER (60MG  Tablet ER 24HR, Oral) Active. Lisinopril (40MG  Tablet, Oral) Active.  Social History Alcohol use Remotely quit alcohol use. Caffeine use Carbonated beverages, Tea. No drug use Tobacco use Current every day smoker.  Family History Arthritis Mother. Cerebrovascular Accident Mother. Diabetes Mellitus Sister.  Review of  Systems General Present- Fatigue. Not Present- Appetite Loss, Chills, Fever, Night Sweats, Weight Gain and Weight Loss. Skin Present- New Lesions. Not Present- Change in Wart/Mole, Dryness, Hives, Jaundice, Non-Healing Wounds, Rash and Ulcer. HEENT Present- Seasonal Allergies and Wears glasses/contact lenses. Not Present- Earache, Hearing Loss, Hoarseness, Nose Bleed, Oral Ulcers, Ringing in the Ears, Sinus Pain, Sore Throat, Visual Disturbances and Yellow Eyes. Respiratory Present- Snoring. Not Present- Bloody sputum, Chronic Cough, Difficulty Breathing and Wheezing. Breast Not Present- Breast Mass, Breast Pain, Nipple Discharge and Skin Changes. Cardiovascular Not Present- Chest Pain, Difficulty Breathing Lying Down, Leg Cramps, Palpitations, Rapid Heart Rate, Shortness of Breath and Swelling of Extremities. Gastrointestinal Present- Change in Bowel Habits and Vomiting. Not Present- Abdominal Pain, Bloating, Bloody Stool, Chronic diarrhea, Constipation, Difficulty Swallowing, Excessive gas, Gets full quickly at meals, Hemorrhoids, Indigestion, Nausea and Rectal Pain. Male Genitourinary Not Present- Blood in Urine, Change in Urinary Stream, Frequency, Impotence, Nocturia, Painful Urination, Urgency and Urine Leakage. Musculoskeletal Not Present- Back Pain, Joint Pain, Joint Stiffness, Muscle Pain, Muscle Weakness and Swelling of Extremities. Neurological Present- Numbness, Tremor, Trouble walking and Weakness. Not Present- Decreased Memory, Fainting, Headaches, Seizures and Tingling. Psychiatric Not Present- Anxiety, Bipolar, Change in Sleep Pattern, Depression, Fearful and Frequent crying. Hematology Not Present- Easy Bruising, Excessive bleeding, Gland problems, HIV and Persistent Infections.   Vitals 10/12/2014 11:51 AM Weight: 183 lb Height: 76in Body Surface Area: 2.11 m Body Mass Index: 22.28 kg/m Temp.: 98.9F(Temporal)  Pulse: 75 (Regular)  BP: 136/82 (Sitting, Left Arm,  Standard)    Physical Exam   General - appears comfortable, no distress; not diaphorectic; seated in a wheelchair  HEENT - normocephalic; sclerae clear, gaze conjugate; mucous membranes moist, dentition good; voice normal  Neck - symmetric on extension; no palpable anterior or posterior cervical adenopathy; no palpable masses in the thyroid bed  Chest -  clear bilaterally with rhonchi, rales, or wheeze  Cor - regular rhythm with normal rate; no significant murmur  Abd - soft without distension; no sign of umbilical hernia  GU - normal male without mass or lesion; palpation in the left inguinal canal with cough and Valsalva shows no sign of hernia; obvious large right inguinal hernia, appears to have both a direct and indirect component, not reducible, not tender  Ext - non-tender without significant edema or lymphedema    Assessment & Plan  INCARCERATED RIGHT INGUINAL HERNIA (550.10  K40.30)  The patient has an incarcerated right inguinal hernia. I provided the patient and his wife with written information on hernia surgery to review at home.  I have recommended immediate repair in the next 1-2 weeks of his incarcerated right inguinal hernia. Patient has had some symptoms of constipation. He denies any pain. He is not tender on examination. Hernia is relatively large and likely does contain incarcerated bowel.  We discussed hernia repair with mesh by open technique. We discussed the risk of recurrence being less than 5%. We discussed restrictions on his activities following the procedure. They understand and wish to proceed with surgery in the near future. This can be performed as an outpatient surgical procedure, but given his comorbidities, I believe he should have his procedure in the main hospital.  The risks and benefits of the procedure have been discussed at length with the patient. The patient understands the proposed procedure, potential alternative treatments, and the  course of recovery to be expected. All of the patient's questions have been answered at this time. The patient wishes to proceed with surgery.  Earnstine Regal, MD, [facility] Office: [phone]

## 2014-11-23 ENCOUNTER — Encounter (HOSPITAL_COMMUNITY): Payer: Self-pay | Admitting: *Deleted

## 2014-11-23 ENCOUNTER — Observation Stay (HOSPITAL_COMMUNITY)
Admission: RE | Admit: 2014-11-23 | Discharge: 2014-11-24 | Disposition: A | Discharge status: Home / Self Care | Payer: Medicare Other | Source: Ambulatory Visit | Attending: Surgery | Admitting: Surgery

## 2014-11-23 ENCOUNTER — Encounter (HOSPITAL_COMMUNITY)
Admission: RE | Disposition: A | Discharge status: Home / Self Care | Payer: Self-pay | Source: Ambulatory Visit | Attending: Surgery

## 2014-11-23 ENCOUNTER — Ambulatory Visit (HOSPITAL_COMMUNITY): Payer: Medicare Other | Admitting: Anesthesiology

## 2014-11-23 DIAGNOSIS — I10 Essential (primary) hypertension: Secondary | ICD-10-CM | POA: Diagnosis not present

## 2014-11-23 DIAGNOSIS — K219 Gastro-esophageal reflux disease without esophagitis: Secondary | ICD-10-CM | POA: Insufficient documentation

## 2014-11-23 DIAGNOSIS — K4030 Unilateral inguinal hernia, with obstruction, without gangrene, not specified as recurrent: Principal | ICD-10-CM | POA: Insufficient documentation

## 2014-11-23 DIAGNOSIS — Z8673 Personal history of transient ischemic attack (TIA), and cerebral infarction without residual deficits: Secondary | ICD-10-CM | POA: Insufficient documentation

## 2014-11-23 DIAGNOSIS — F17210 Nicotine dependence, cigarettes, uncomplicated: Secondary | ICD-10-CM | POA: Insufficient documentation

## 2014-11-23 DIAGNOSIS — E780 Pure hypercholesterolemia: Secondary | ICD-10-CM | POA: Insufficient documentation

## 2014-11-23 DIAGNOSIS — K4090 Unilateral inguinal hernia, without obstruction or gangrene, not specified as recurrent: Secondary | ICD-10-CM

## 2014-11-23 DIAGNOSIS — Z79899 Other long term (current) drug therapy: Secondary | ICD-10-CM | POA: Diagnosis not present

## 2014-11-23 HISTORY — PX: INGUINAL HERNIA REPAIR: SHX194

## 2014-11-23 HISTORY — PX: INSERTION OF MESH: SHX5868

## 2014-11-23 LAB — CBC
HCT: 36.3 % — ABNORMAL LOW (ref 39.0–52.0)
Hemoglobin: 11.6 g/dL — ABNORMAL LOW (ref 13.0–17.0)
MCH: 29.9 pg (ref 26.0–34.0)
MCHC: 32 g/dL (ref 30.0–36.0)
MCV: 93.6 fL (ref 78.0–100.0)
Platelets: 268 10*3/uL (ref 150–400)
RBC: 3.88 MIL/uL — AB (ref 4.22–5.81)
RDW: 13.9 % (ref 11.5–15.5)
WBC: 11.1 10*3/uL — AB (ref 4.0–10.5)

## 2014-11-23 LAB — BASIC METABOLIC PANEL
Anion gap: 5 (ref 5–15)
BUN: 26 mg/dL — ABNORMAL HIGH (ref 6–23)
CHLORIDE: 111 mmol/L (ref 96–112)
CO2: 24 mmol/L (ref 19–32)
CREATININE: 1.85 mg/dL — AB (ref 0.50–1.35)
Calcium: 10 mg/dL (ref 8.4–10.5)
GFR calc non Af Amer: 37 mL/min — ABNORMAL LOW (ref >90)
GFR, EST AFRICAN AMERICAN: 43 mL/min — AB (ref >90)
Glucose, Bld: 103 mg/dL — ABNORMAL HIGH (ref 70–99)
Potassium: 5.3 mmol/L — ABNORMAL HIGH (ref 3.5–5.1)
Sodium: 140 mmol/L (ref 135–145)

## 2014-11-23 SURGERY — REPAIR, HERNIA, INGUINAL, ADULT
Anesthesia: General | Site: Groin | Laterality: Right

## 2014-11-23 MED ORDER — FENTANYL CITRATE 0.05 MG/ML IJ SOLN
INTRAMUSCULAR | Status: AC
  Filled 2014-11-23: qty 5

## 2014-11-23 MED ORDER — HYDROMORPHONE HCL 1 MG/ML IJ SOLN
INTRAMUSCULAR | Status: AC
  Filled 2014-11-23: qty 1

## 2014-11-23 MED ORDER — HYDROMORPHONE HCL 1 MG/ML IJ SOLN
0.2500 mg | INTRAMUSCULAR | Status: DC | PRN
  Administered 2014-11-23 (×4): 0.5 mg via INTRAVENOUS

## 2014-11-23 MED ORDER — AMLODIPINE BESYLATE 10 MG PO TABS
10.0000 mg | ORAL_TABLET | Freq: Every morning | ORAL | Status: DC
Start: 2014-11-24 — End: 2014-11-24
  Administered 2014-11-24: 10 mg via ORAL
  Filled 2014-11-23: qty 1

## 2014-11-23 MED ORDER — PROMETHAZINE HCL 25 MG/ML IJ SOLN: 6.2500 mg | INTRAMUSCULAR | Status: DC | PRN

## 2014-11-23 MED ORDER — EPHEDRINE SULFATE 50 MG/ML IJ SOLN
INTRAMUSCULAR | Status: DC | PRN
  Administered 2014-11-23: 10 mg via INTRAVENOUS
  Administered 2014-11-23: 5 mg via INTRAVENOUS

## 2014-11-23 MED ORDER — ONDANSETRON HCL 4 MG/2ML IJ SOLN
INTRAMUSCULAR | Status: AC
  Filled 2014-11-23: qty 2

## 2014-11-23 MED ORDER — KCL IN DEXTROSE-NACL 20-5-0.45 MEQ/L-%-% IV SOLN
INTRAVENOUS | Status: DC
  Administered 2014-11-23: 11:00:00 via INTRAVENOUS
  Filled 2014-11-23 (×2): qty 1000

## 2014-11-23 MED ORDER — ACETAMINOPHEN 325 MG PO TABS: 650.0000 mg | ORAL_TABLET | ORAL | Status: DC | PRN

## 2014-11-23 MED ORDER — CEFAZOLIN SODIUM-DEXTROSE 2-3 GM-% IV SOLR
INTRAVENOUS | Status: AC
  Filled 2014-11-23: qty 50

## 2014-11-23 MED ORDER — LACTATED RINGERS IV SOLN
INTRAVENOUS | Status: DC | PRN
  Administered 2014-11-23 (×2): via INTRAVENOUS

## 2014-11-23 MED ORDER — CEFAZOLIN SODIUM-DEXTROSE 2-3 GM-% IV SOLR
2.0000 g | INTRAVENOUS | Status: AC
  Administered 2014-11-23: 2 g via INTRAVENOUS

## 2014-11-23 MED ORDER — BUPIVACAINE HCL 0.25 % IJ SOLN
INTRAMUSCULAR | Status: DC | PRN
  Administered 2014-11-23: 20 mL

## 2014-11-23 MED ORDER — ROCURONIUM BROMIDE 100 MG/10ML IV SOLN
INTRAVENOUS | Status: DC | PRN
  Administered 2014-11-23: 20 mg via INTRAVENOUS

## 2014-11-23 MED ORDER — HYDRALAZINE HCL 25 MG PO TABS
25.0000 mg | ORAL_TABLET | Freq: Two times a day (BID) | ORAL | Status: DC
  Administered 2014-11-23 – 2014-11-24 (×2): 25 mg via ORAL
  Filled 2014-11-23 (×3): qty 1

## 2014-11-23 MED ORDER — NEOSTIGMINE METHYLSULFATE 10 MG/10ML IV SOLN
INTRAVENOUS | Status: AC
  Filled 2014-11-23: qty 1

## 2014-11-23 MED ORDER — LIDOCAINE HCL (CARDIAC) 20 MG/ML IV SOLN
INTRAVENOUS | Status: DC | PRN
  Administered 2014-11-23: 100 mg via INTRAVENOUS

## 2014-11-23 MED ORDER — ONDANSETRON HCL 4 MG/2ML IJ SOLN
INTRAMUSCULAR | Status: DC | PRN
Start: 2014-11-23 — End: 2014-11-23
  Administered 2014-11-23: 4 mg via INTRAVENOUS

## 2014-11-23 MED ORDER — HYDROCODONE-ACETAMINOPHEN 5-325 MG PO TABS
1.0000 | ORAL_TABLET | ORAL | Status: DC | PRN
  Administered 2014-11-24: 2 via ORAL
  Filled 2014-11-23: qty 2

## 2014-11-23 MED ORDER — PHENYLEPHRINE HCL 10 MG/ML IJ SOLN
INTRAMUSCULAR | Status: DC | PRN
  Administered 2014-11-23: 80 ug via INTRAVENOUS

## 2014-11-23 MED ORDER — GLYCOPYRROLATE 0.2 MG/ML IJ SOLN
INTRAMUSCULAR | Status: DC | PRN
  Administered 2014-11-23: 0.4 mg via INTRAVENOUS

## 2014-11-23 MED ORDER — NEOSTIGMINE METHYLSULFATE 10 MG/10ML IV SOLN
INTRAVENOUS | Status: DC | PRN
  Administered 2014-11-23: 3 mg via INTRAVENOUS

## 2014-11-23 MED ORDER — LISINOPRIL 40 MG PO TABS
40.0000 mg | ORAL_TABLET | Freq: Every day | ORAL | Status: DC
  Administered 2014-11-23 – 2014-11-24 (×2): 40 mg via ORAL
  Filled 2014-11-23 (×2): qty 1

## 2014-11-23 MED ORDER — HYDROMORPHONE HCL 1 MG/ML IJ SOLN
1.0000 mg | INTRAMUSCULAR | Status: DC | PRN
  Administered 2014-11-23 – 2014-11-24 (×3): 1 mg via INTRAVENOUS
  Filled 2014-11-23 (×3): qty 1

## 2014-11-23 MED ORDER — BRIMONIDINE TARTRATE 0.15 % OP SOLN
1.0000 [drp] | Freq: Two times a day (BID) | OPHTHALMIC | Status: DC
  Administered 2014-11-23 – 2014-11-24 (×2): 1 [drp] via OPHTHALMIC
  Filled 2014-11-23: qty 5

## 2014-11-23 MED ORDER — PROPOFOL 10 MG/ML IV BOLUS
INTRAVENOUS | Status: DC | PRN
  Administered 2014-11-23: 200 mg via INTRAVENOUS

## 2014-11-23 MED ORDER — GLYCOPYRROLATE 0.2 MG/ML IJ SOLN
INTRAMUSCULAR | Status: AC
  Filled 2014-11-23: qty 2

## 2014-11-23 MED ORDER — BUPIVACAINE HCL 0.25 % IJ SOLN
INTRAMUSCULAR | Status: AC
  Filled 2014-11-23: qty 1

## 2014-11-23 MED ORDER — ONDANSETRON HCL 4 MG PO TABS: 4.0000 mg | ORAL_TABLET | Freq: Four times a day (QID) | ORAL | Status: DC | PRN

## 2014-11-23 MED ORDER — MIDAZOLAM HCL 2 MG/2ML IJ SOLN
INTRAMUSCULAR | Status: AC
  Filled 2014-11-23: qty 2

## 2014-11-23 MED ORDER — DORZOLAMIDE HCL-TIMOLOL MAL 2-0.5 % OP SOLN
1.0000 [drp] | Freq: Two times a day (BID) | OPHTHALMIC | Status: DC
  Administered 2014-11-23 – 2014-11-24 (×2): 1 [drp] via OPHTHALMIC
  Filled 2014-11-23: qty 10

## 2014-11-23 MED ORDER — GLYCOPYRROLATE 0.2 MG/ML IJ SOLN
INTRAMUSCULAR | Status: AC
  Filled 2014-11-23: qty 3

## 2014-11-23 MED ORDER — DEXAMETHASONE SODIUM PHOSPHATE 10 MG/ML IJ SOLN
INTRAMUSCULAR | Status: AC
  Filled 2014-11-23: qty 1

## 2014-11-23 MED ORDER — LATANOPROST 0.005 % OP SOLN
1.0000 [drp] | Freq: Every day | OPHTHALMIC | Status: DC
  Administered 2014-11-23: 1 [drp] via OPHTHALMIC
  Filled 2014-11-23: qty 2.5

## 2014-11-23 MED ORDER — PROPOFOL 10 MG/ML IV BOLUS
INTRAVENOUS | Status: AC
  Filled 2014-11-23: qty 20

## 2014-11-23 MED ORDER — SUCCINYLCHOLINE CHLORIDE 20 MG/ML IJ SOLN
INTRAMUSCULAR | Status: DC | PRN
  Administered 2014-11-23: 100 mg via INTRAVENOUS

## 2014-11-23 MED ORDER — ONDANSETRON HCL 4 MG/2ML IJ SOLN
4.0000 mg | Freq: Four times a day (QID) | INTRAMUSCULAR | Status: DC | PRN
  Administered 2014-11-23: 4 mg via INTRAVENOUS
  Filled 2014-11-23: qty 2

## 2014-11-23 MED ORDER — ROCURONIUM BROMIDE 100 MG/10ML IV SOLN
INTRAVENOUS | Status: AC
  Filled 2014-11-23: qty 1

## 2014-11-23 MED ORDER — FENTANYL CITRATE 0.05 MG/ML IJ SOLN
INTRAMUSCULAR | Status: DC | PRN
  Administered 2014-11-23: 50 ug via INTRAVENOUS

## 2014-11-23 MED ORDER — DEXAMETHASONE SODIUM PHOSPHATE 10 MG/ML IJ SOLN
INTRAMUSCULAR | Status: DC | PRN
  Administered 2014-11-23: 10 mg via INTRAVENOUS

## 2014-11-23 MED ORDER — LIDOCAINE HCL (CARDIAC) 20 MG/ML IV SOLN
INTRAVENOUS | Status: AC
  Filled 2014-11-23: qty 5

## 2014-11-23 SURGICAL SUPPLY — 36 items
APL SKNCLS STERI-STRIP NONHPOA (GAUZE/BANDAGES/DRESSINGS) ×2
APPLICATOR COTTON TIP 6IN STRL (MISCELLANEOUS) ×3 IMPLANT
BENZOIN TINCTURE PRP APPL 2/3 (GAUZE/BANDAGES/DRESSINGS) ×3 IMPLANT
BLADE HEX COATED 2.75 (ELECTRODE) ×3 IMPLANT
BLADE SURG 15 STRL LF DISP TIS (BLADE) ×2 IMPLANT
BLADE SURG 15 STRL SS (BLADE) ×3
BLADE SURG SZ10 CARB STEEL (BLADE) ×1 IMPLANT
DECANTER SPIKE VIAL GLASS SM (MISCELLANEOUS) ×3 IMPLANT
DRAIN PENROSE 18X1/2 LTX STRL (DRAIN) ×3 IMPLANT
DRAPE LAPAROTOMY TRNSV 102X78 (DRAPE) ×3 IMPLANT
ELECT REM PT RETURN 9FT ADLT (ELECTROSURGICAL) ×3
ELECTRODE REM PT RTRN 9FT ADLT (ELECTROSURGICAL) ×2 IMPLANT
GAUZE SPONGE 4X4 12PLY STRL (GAUZE/BANDAGES/DRESSINGS) ×3 IMPLANT
GLOVE BIOGEL PI IND STRL 7.0 (GLOVE) ×2 IMPLANT
GLOVE BIOGEL PI INDICATOR 7.0 (GLOVE) ×1
GLOVE SURG ORTHO 8.0 STRL STRW (GLOVE) ×3 IMPLANT
GOWN STRL REUS W/TWL LRG LVL3 (GOWN DISPOSABLE) ×3 IMPLANT
GOWN STRL REUS W/TWL XL LVL3 (GOWN DISPOSABLE) ×6 IMPLANT
KIT BASIN OR (CUSTOM PROCEDURE TRAY) ×3 IMPLANT
LIQUID BAND (GAUZE/BANDAGES/DRESSINGS) ×1 IMPLANT
MESH ULTRAPRO 3X6 7.6X15CM (Mesh General) ×1 IMPLANT
NDL HYPO 25X1 1.5 SAFETY (NEEDLE) ×2 IMPLANT
NEEDLE HYPO 25X1 1.5 SAFETY (NEEDLE) ×3 IMPLANT
NS IRRIG 1000ML POUR BTL (IV SOLUTION) ×3 IMPLANT
PACK BASIC VI WITH GOWN DISP (CUSTOM PROCEDURE TRAY) ×3 IMPLANT
PENCIL BUTTON HOLSTER BLD 10FT (ELECTRODE) ×3 IMPLANT
SPONGE LAP 4X18 X RAY DECT (DISPOSABLE) ×9 IMPLANT
STRIP CLOSURE SKIN 1/2X4 (GAUZE/BANDAGES/DRESSINGS) ×3 IMPLANT
SUT MNCRL AB 4-0 PS2 18 (SUTURE) ×3 IMPLANT
SUT NOVA NAB GS-22 2 0 T19 (SUTURE) ×6 IMPLANT
SUT SILK 2 0 SH (SUTURE) ×3 IMPLANT
SUT VIC AB 3-0 SH 18 (SUTURE) ×3 IMPLANT
SYR BULB IRRIGATION 50ML (SYRINGE) ×3 IMPLANT
SYR CONTROL 10ML LL (SYRINGE) ×3 IMPLANT
TOWEL OR 17X26 10 PK STRL BLUE (TOWEL DISPOSABLE) ×3 IMPLANT
YANKAUER SUCT BULB TIP 10FT TU (MISCELLANEOUS) ×3 IMPLANT

## 2014-11-23 NOTE — Anesthesia Postprocedure Evaluation (Signed)
  Anesthesia Post-op Note  Patient: Darrell [name]  Procedure(s) Performed: Procedure(s): right inguinal hernia repair with mesh (Right) INSERTION OF MESH (N/A)  Patient Location: PACU  Anesthesia Type:General  Level of Consciousness: awake and alert   Airway and Oxygen Therapy: Patient Spontanous Breathing  Post-op Pain: none  Post-op Assessment: Post-op Vital signs reviewed  Post-op Vital Signs: Reviewed  Last Vitals:  Filed Vitals:   11/23/14 0951  BP:   Pulse:   Temp: 36.4 C  Resp:     Complications: No apparent anesthesia complications

## 2014-11-23 NOTE — Anesthesia Preprocedure Evaluation (Addendum)
Anesthesia Evaluation  Patient identified by MRN, date of birth, ID band Patient awake    Reviewed: Allergy & Precautions, NPO status , Patient's Chart, lab work & pertinent test results  Airway Mallampati: III  TM Distance: >3 FB Neck ROM: Full    Dental  (+) Edentulous Upper, Partial Lower, Missing   Pulmonary Current Smoker,  breath sounds clear to auscultation        Cardiovascular hypertension, Pt. on medications Rhythm:Regular Rate:Normal     Neuro/Psych CVA negative psych ROS   GI/Hepatic Neg liver ROS, GERD-  ,  Endo/Other  negative endocrine ROS  Renal/GU CRFRenal disease     Musculoskeletal negative musculoskeletal ROS (+)   Abdominal   Peds  Hematology negative hematology ROS (+)   Anesthesia Other Findings   Reproductive/Obstetrics                            Anesthesia Physical Anesthesia Plan  ASA: III  Anesthesia Plan: General   Post-op Pain Management:    Induction: Intravenous  Airway Management Planned: LMA  Additional Equipment:   Intra-op Plan:   Post-operative Plan: Extubation in OR  Informed Consent: I have reviewed the patients History and Physical, chart, labs and discussed the procedure including the risks, benefits and alternatives for the proposed anesthesia with the patient or authorized representative who has indicated his/her understanding and acceptance.   Dental advisory given  Plan Discussed with: CRNA  Anesthesia Plan Comments:         Anesthesia Quick Evaluation

## 2014-11-23 NOTE — Interval H&P Note (Signed)
History and Physical Interval Note:  11/23/2014 7:11 AM  Darrell [name]  has presented today for surgery, with the diagnosis of incarcerated right inguinal hernia.  The various methods of treatment have been discussed with the patient and family. After consideration of risks, benefits and other options for treatment, the patient has consented to    Procedure(s): right inguinal hernia repair with mesh (Right) INSERTION OF MESH (N/A)   as a surgical intervention .  The patient's history has been reviewed, patient examined, no change in status, stable for surgery.  I have reviewed the patient's chart and labs.  Questions were answered to the patient's satisfaction.    Earnstine Regal, MD, [facility] Office: [place]

## 2014-11-23 NOTE — Op Note (Signed)
Inguinal Hernia, Open, Procedure Note  Pre-operative Diagnosis:  Incarcerated right inguinal hernia  Post-operative Diagnosis: same  Surgeon:  Earnstine Regal, MD, FACS  Anesthesia:  General  Preparation:  Chlora-prep  Estimated Blood Loss: Minimal  Complications:  none  Indications: The patient presented with a right, not reducible hernia.    Procedure Details  The patient was evaluated in the holding area. All of the patient's questions were answered and the proposed procedure was confirmed. The site of the procedure was properly marked. The patient was taken to the Operating Room, identified by name, and the procedure verified as inguinal hernia repair.  The patient was placed in the supine position and underwent induction of anesthesia. A "Time Out" was performed per routine. The lower abdomen and groin were prepped and draped in the usual aseptic fashion.  After ascertaining that an adequate level of anesthesia had been obtained, an incision was made in the groin with a #10 blade.  Dissection was carried through the subcutaneous tissues and hemostasis obtained with the electrocautery.  A Gelpi retractor was placed for exposure.  The external oblique fascia was incised in line with it's fibers and extended through the external inguinal ring.  The cord structures were dissected out of the inguinal canal and encircled with a Penrose drain.  The floor of the inguinal canal was dissected out.  There was a small direct inguinal hernia which was reducible.  The cord was explored and a large lipoma of the cord was excised at the level of the internal inguinal ring and suture ligated.  There was a large indirect hernia sac.  Contents were reduced.  Sac was dissected out up to the internal ring and a high ligation performed with 2-0 silk suture ligature and the sac excised.  The floor of the inguinal canal was reconstructed with Ethicon Ultrapro mesh cut to the appropriate dimensions.  It was  secured to the pubic tubercle with a 2-0 Novafil suture and along the inguinal ligament with a running 2-0 Novafil suture.  Mesh was split to accommodate the cord structures.  The superior margin of the mesh was secured to the transversalis and internal oblique musculature with interrupted 2-0 Novafil sutures.  The tails of the mesh were overlapped lateral to the cord structures and secured to the inguinal ligament with interrupted 2-0 Novafil sutures to recreate the internal inguinal ring.  Cord structures were returned to the inguinal canal.  Local anesthetic was infiltrated throughout the field.  External oblique fascia was closed with interrupted 3-0 Vicryl sutures.  Subcutaneous tissues were closed with interrupted 3-0 Vicryl sutures.  Skin was anesthetized with local anesthetic, and the skin edges were re-approximated with a running 4-0 Monocryl suture.  Wound was washed and dried and Dermabond was applied.  Instrument, sponge, and needle counts were correct prior to closure and at the conclusion of the case.  The patient tolerated the procedure well.  The patient was awakened from anesthesia and brought to the recovery room in stable condition.  Earnstine Regal, MD, [facility] Office: [phone]

## 2014-11-23 NOTE — Progress Notes (Signed)
Incision w/dermabond cdi right inguinal area- OR nurse notified of missing LDA. Ice to incision

## 2014-11-23 NOTE — Transfer of Care (Signed)
Immediate Anesthesia Transfer of Care Note  Patient: Darrell [name]  Procedure(s) Performed: Procedure(s): right inguinal hernia repair with mesh (Right) INSERTION OF MESH (N/A)  Patient Location: PACU  Anesthesia Type:General  Level of Consciousness: sedated  Airway & Oxygen Therapy: Patient Spontanous Breathing and Patient connected to face mask oxygen  Post-op Assessment: Report given to RN and Post -op Vital signs reviewed and stable  Post vital signs: Reviewed and stable  Last Vitals:  Filed Vitals:   11/23/14 0538  BP: 121/82  Pulse: 85  Temp: 36.7 C  Resp: 16    Complications: No apparent anesthesia complications

## 2014-11-24 ENCOUNTER — Encounter (HOSPITAL_COMMUNITY): Payer: Self-pay | Admitting: Surgery

## 2014-11-24 DIAGNOSIS — K4030 Unilateral inguinal hernia, with obstruction, without gangrene, not specified as recurrent: Secondary | ICD-10-CM | POA: Diagnosis not present

## 2014-11-24 MED ORDER — HYDROCODONE-ACETAMINOPHEN 5-325 MG PO TABS: 1.0000 | ORAL_TABLET | ORAL | Status: DC | PRN

## 2014-11-24 NOTE — Discharge Summary (Signed)
Physician Discharge Summary [facility]  Patient ID: Darrell [name] MRN: [id number] DOB/AGE: [birth year] 64 y.o.  Admit date: 11/23/2014 Discharge date: 11/24/2014  Admission Diagnoses:  Incarcerated right inguinal hernia  Discharge Diagnoses:  Principal Problem:   Inguinal hernia Active Problems:   Incarcerated inguinal hernia   Discharged Condition: good  Hospital Course: patient observed after [facility] repair with mesh.  Post op complicated by urinary retention requiring catheterization.  Prepared for discharge on POD#1.  Consults: None  Treatments: surgery: repair incarcerated RIH with mesh  Discharge Exam: Blood pressure 123/76, pulse 90, temperature 99.8 F (37.7 C), temperature source Oral, resp. rate 16, height 6\' 4"  (1.93 m), weight 181 lb 9 oz (82.356 kg), SpO2 95 %. HEENT - clear Neck - soft Chest - clear bilaterally Cor - RRR Abd - right groin wound clear and dry and intact  Disposition: Home  Discharge Instructions    Diet - low sodium heart healthy    Complete by:  As directed      Discharge instructions    Complete by:  As directed   [facility] [place]  Always review your discharge instruction sheet given to you by the facility where your surgery was performed.  A  prescription for pain medication may be given to you upon discharge.  Take your pain medication as prescribed.  If narcotic pain medicine is not needed, then you may take acetaminophen (Tylenol) or ibuprofen (Advil) as needed.  Take your usually prescribed medications unless otherwise directed.  If you need a refill on your pain medication, please contact your pharmacy.  They will contact our office to request authorization. Prescriptions will not be filled after 5 pm daily or on weekends.  You should follow a light diet the first 24 hours after arrival home, such as soup and crackers or toast.  Be sure to include plenty of fluids  daily.  Resume your normal diet the day after surgery.  Most patients will experience some swelling and bruising around the surgical site.  Ice packs and reclining will help.  Swelling and bruising can take several days to resolve.   It is common to experience some constipation if taking pain medication after surgery.  Increasing fluid intake and taking a stool softener (such as Colace) will usually help or prevent this problem from occurring.  A mild laxative (Milk of Magnesia or Miralax) should be taken according to package directions if there are no bowel movements after 48 hours.  Unless discharge instructions indicate otherwise, you may remove your bandages 24-48 hours after surgery, and you may shower at that time.  You may have steri-strips (small skin tapes) in place directly over the incision.  These strips should be left on the skin for 7-10 days.  If your surgeon used skin glue on the incision, you may shower in 24 hours.  The glue will flake off over the next 2-3 weeks.  Any sutures or staples will be removed at the office during your follow-up visit.  ACTIVITIES:  You may resume regular (light) daily activities beginning the next day-such as daily self-care, walking, climbing stairs-gradually increasing activities as tolerated.  You may have sexual intercourse when it is comfortable.  Refrain from any heavy lifting or straining until approved by your doctor.  You may drive when you are no longer taking prescription pain medication, you can comfortably wear a seatbelt, and you can safely maneuver your car and apply brakes.  You should see your doctor in the office for a follow-up appointment approximately 2-3 weeks after your surgery.  Make sure that you call for this appointment within a day or two after you arrive home to insure a convenient appointment time.   WHEN TO CALL YOUR DOCTOR: Fever greater than 101.0 Inability to urinate Persistent nausea and/or vomiting Extreme swelling or  bruising Continued bleeding from incision Increased pain, redness, or drainage from the incision  The clinic staff is available to answer your questions during regular business hours.  Please don't hesitate to call and ask to speak to one of the nurses for clinical concerns.  If you have a medical emergency, go to the nearest emergency room or call 911.  A surgeon from [facility] is always on call for the hospital.   [facility] [address] [place], [place], [place]  [address]  [phone] ? [phone] ? FAX [phone]  [email or web address]     Increase activity slowly    Complete by:  As directed      Insert,temp indwelling blad cath,simple    Complete by:  As directed   Insert Foley catheter before discharge this morning IF patient is unable to void.  Notify physician at [phone] if Foley needed so that urology consult can be arranged out-patient. Earnstine Regal, MD, [facility] Office: [phone]     No dressing needed    Complete by:  As directed             Medication List    TAKE these medications        amLODipine 10 MG tablet  Commonly known as:  NORVASC  Take 10 mg by mouth every morning.     aspirin EC 81 MG tablet  Take 81 mg by mouth daily.     brimonidine 0.1 % Soln  Commonly known as:  ALPHAGAN P  Place 1 drop into both eyes 2 (two) times daily.     dorzolamide-timolol 22.3-6.8 MG/ML ophthalmic solution  Commonly known as:  COSOPT  Place 1 drop into both eyes 2 (two) times daily.     esomeprazole 40 MG capsule  Commonly known as:  NEXIUM  Take 40 mg by mouth daily before breakfast.     hydrALAZINE 25 MG tablet  Commonly known as:  APRESOLINE  Take 1 tablet (25 mg total) by mouth 2 (two) times daily.     HYDROcodone-acetaminophen 5-325 MG per tablet  Commonly known as:  NORCO/VICODIN  Take 1-2 tablets by mouth every 4 (four) hours as needed for moderate pain.      latanoprost 0.005 % ophthalmic solution  Commonly known as:  XALATAN  Place 1 drop into both eyes at bedtime.     lisinopril 40 MG tablet  Commonly known as:  PRINIVIL,ZESTRIL  Take 1 tablet (40 mg total) by mouth daily.     ONE DAILY MULTIVITAMIN MEN Tabs  Take 1 tablet by mouth daily.         Earnstine Regal, MD, [facility] Office: [phone]   Signed: Earnstine Regal 11/24/2014, 7:08 AM

## 2014-11-24 NOTE — Progress Notes (Signed)
UR completed 

## 2014-11-24 NOTE — Progress Notes (Signed)
Pt.unable to void ,bladder scanner shows 175ccs of urine. Did straight catheterization, output 550ccs pt.tolerated the procedure. We'll continue to monitor.

## 2014-11-24 NOTE — Progress Notes (Signed)
Nurse reviewed discharge instructions with pt.  Pt and wife verbalized understanding of discharge instructions, follow up appointment and new medications.  Wife taught foley care and how to change pt's catheter bag.  MD aware that pt had to be catheterized prior to discharge.  Prescription given to pt prior to discharge.

## 2014-12-11 ENCOUNTER — Ambulatory Visit: Payer: Medicare Other | Admitting: Internal Medicine

## 2015-01-22 ENCOUNTER — Telehealth: Payer: Self-pay | Admitting: Internal Medicine

## 2015-01-22 NOTE — Telephone Encounter (Signed)
Patients wife called to request an order for a wheelchair for the patient, please f/u with pt.

## 2015-01-22 NOTE — Telephone Encounter (Signed)
Patient also requesting an order for a walker. Please f/u

## 2015-02-19 ENCOUNTER — Other Ambulatory Visit: Payer: Self-pay | Admitting: Internal Medicine

## 2015-02-24 ENCOUNTER — Encounter: Payer: Self-pay | Admitting: Internal Medicine

## 2015-02-24 ENCOUNTER — Ambulatory Visit: Payer: Medicare Other | Attending: Internal Medicine | Admitting: Internal Medicine

## 2015-02-24 VITALS — BP 150/90 | HR 86 | Temp 98.0°F | Resp 15 | Wt 192.2 lb

## 2015-02-24 DIAGNOSIS — I129 Hypertensive chronic kidney disease with stage 1 through stage 4 chronic kidney disease, or unspecified chronic kidney disease: Secondary | ICD-10-CM | POA: Insufficient documentation

## 2015-02-24 DIAGNOSIS — N189 Chronic kidney disease, unspecified: Secondary | ICD-10-CM | POA: Insufficient documentation

## 2015-02-24 DIAGNOSIS — F17210 Nicotine dependence, cigarettes, uncomplicated: Secondary | ICD-10-CM | POA: Insufficient documentation

## 2015-02-24 DIAGNOSIS — Z8673 Personal history of transient ischemic attack (TIA), and cerebral infarction without residual deficits: Secondary | ICD-10-CM | POA: Insufficient documentation

## 2015-02-24 DIAGNOSIS — Z720 Tobacco use: Secondary | ICD-10-CM

## 2015-02-24 DIAGNOSIS — F17200 Nicotine dependence, unspecified, uncomplicated: Secondary | ICD-10-CM

## 2015-02-24 DIAGNOSIS — I10 Essential (primary) hypertension: Secondary | ICD-10-CM

## 2015-02-24 MED ORDER — HYDRALAZINE HCL 25 MG PO TABS: 25.0000 mg | ORAL_TABLET | Freq: Two times a day (BID) | ORAL | Status: DC

## 2015-02-24 MED ORDER — LISINOPRIL 40 MG PO TABS: 40.0000 mg | ORAL_TABLET | Freq: Every day | ORAL | Status: DC

## 2015-02-24 NOTE — Progress Notes (Signed)
MRN: [id number] Name: Darrell [name]  Sex: male Age: 64 y.o. DOB: 1951-04-18  Allergies: Atacand hct and Shellfish allergy  Chief Complaint  Patient presents with  . Follow-up    HPI: Patient is 64 y.o. male who has history of stroke, hypertension, CK D, comes today for followup  as per patient he then out of his blood pressure medication and needs refill, currently denies any acute symptoms his manual blood pressure is 150/90, patient also had hernia surgery and denies any abdominal pain nausea vomiting, patient also follows up with the nephrologist since has chronic renal insufficiency. Patient still smokes cigarettes, I have counseled patient to quit smoking.  Past Medical History  Diagnosis Date  . High cholesterol   . Hypertension   . Stroke     2011 with residual deficit left sided weakness  . Chronic kidney disease     stage 3 GFR 30-59 ml/min   . GERD (gastroesophageal reflux disease)   . Neuromuscular disorder     chronic inflammatory demyelinating polyneuropathy   . Tobacco dependence   . Glaucoma   . Dysphagia as late effect of cerebrovascular disease     pts wife states pt has to eat soft foods     Past Surgical History  Procedure Laterality Date  . Shoulder surgery Bilateral 1988, 1998  . Eye surgery    . Inguinal hernia repair Right 11/23/2014    Procedure: right inguinal hernia repair with mesh;  Surgeon: Armandina Gemma, MD;  Location: [facility];  Service: General;  Laterality: Right;  . Insertion of mesh N/A 11/23/2014    Procedure: INSERTION OF MESH;  Surgeon: Armandina Gemma, MD;  Location: [facility];  Service: General;  Laterality: N/A;      Medication List       This list is accurate as of: 02/24/15 11:49 AM.  Always use your most recent med list.               amLODipine 10 MG tablet  Commonly known as:  NORVASC  Take 10 mg by mouth every morning.     aspirin EC 81 MG tablet  Take 81 mg by mouth daily.     brimonidine 0.1 % Soln  Commonly known as:   ALPHAGAN P  Place 1 drop into both eyes 2 (two) times daily.     dorzolamide-timolol 22.3-6.8 MG/ML ophthalmic solution  Commonly known as:  COSOPT  Place 1 drop into both eyes 2 (two) times daily.     esomeprazole 40 MG capsule  Commonly known as:  NEXIUM  Take 40 mg by mouth daily before breakfast.     hydrALAZINE 25 MG tablet  Commonly known as:  APRESOLINE  Take 1 tablet (25 mg total) by mouth 2 (two) times daily.     HYDROcodone-acetaminophen 5-325 MG per tablet  Commonly known as:  NORCO/VICODIN  Take 1-2 tablets by mouth every 4 (four) hours as needed for moderate pain.     latanoprost 0.005 % ophthalmic solution  Commonly known as:  XALATAN  Place 1 drop into both eyes at bedtime.     lisinopril 40 MG tablet  Commonly known as:  PRINIVIL,ZESTRIL  Take 1 tablet (40 mg total) by mouth daily.     ONE DAILY MULTIVITAMIN MEN Tabs  Take 1 tablet by mouth daily.        Meds ordered this encounter  Medications  . hydrALAZINE (APRESOLINE) 25 MG tablet    Sig: Take 1 tablet (25 mg  total) by mouth 2 (two) times daily.    Dispense:  360 tablet    Refill:  3  . lisinopril (PRINIVIL,ZESTRIL) 40 MG tablet    Sig: Take 1 tablet (40 mg total) by mouth daily.    Dispense:  30 tablet    Refill:  3    Immunization History  Administered Date(s) Administered  . Influenza,inj,Quad PF,36+ Mos 09/21/2014  . Pneumococcal Polysaccharide-23 09/21/2014    Family History  Problem Relation Age of Onset  . Hypertension Mother   . Diabetes Sister   . Hypertension Sister     History  Substance Use Topics  . Smoking status: Current Every Day Smoker -- 1.00 packs/day for 40 years    Types: Cigarettes  . Smokeless tobacco: Never Used  . Alcohol Use: Yes     Comment: alcohol free for 1 year was drinking 1/2 pint per day     Review of Systems   As noted in HPI  Filed Vitals:   02/24/15 1137  BP: 150/90  Pulse:   Temp:   Resp:     Physical Exam  Physical Exam    Constitutional:  Patient is in wheelchair not in acute distress  HENT:  Head: Normocephalic and atraumatic.  Eyes: EOM are normal. Pupils are equal, round, and reactive to light.  Cardiovascular: Normal rate and regular rhythm.   Abdominal: Soft. There is no tenderness. There is no rebound.  Musculoskeletal: He exhibits no edema.    CBC    Component Value Date/Time   WBC 11.1* 11/23/2014 0628   WBC 9.9 03/30/2014 1538   RBC 3.88* 11/23/2014 0628   RBC 4.32 03/30/2014 1538   HGB 11.6* 11/23/2014 0628   HCT 36.3* 11/23/2014 0628   PLT 268 11/23/2014 0628   MCV 93.6 11/23/2014 0628   LYMPHSABS 3.1 03/30/2014 1538   LYMPHSABS 2.4 10/07/2013 1331   MONOABS 0.7 10/07/2013 1331   EOSABS 0.8* 03/30/2014 1538   EOSABS 0.4 10/07/2013 1331   BASOSABS 0.0 03/30/2014 1538   BASOSABS 0.0 10/07/2013 1331    CMP     Component Value Date/Time   NA 140 11/23/2014 0629   NA 142 03/30/2014 1538   K 5.3* 11/23/2014 0629   CL 111 11/23/2014 0629   CO2 24 11/23/2014 0629   GLUCOSE 103* 11/23/2014 0629   GLUCOSE 94 03/30/2014 1538   BUN 26* 11/23/2014 0629   BUN 33* 03/30/2014 1538   CREATININE 1.85* 11/23/2014 0629   CREATININE 1.59* 02/25/2014 1622   CALCIUM 10.0 11/23/2014 0629   PROT 6.8 02/25/2014 1622   PROT 7.1 10/20/2013 1443   ALBUMIN 4.1 02/25/2014 1622   AST 11 02/25/2014 1622   ALT <8 02/25/2014 1622   ALKPHOS 76 02/25/2014 1622   BILITOT 0.4 02/25/2014 1622   GFRNONAA 37* 11/23/2014 0629   GFRNONAA 46* 02/25/2014 1622   GFRAA 43* 11/23/2014 0629   GFRAA 53* 02/25/2014 1622    Lab Results  Component Value Date/Time   CHOL 138 10/07/2013 01:31 PM    Lab Results  Component Value Date/Time   HGBA1C 5.3 10/07/2013 02:20 PM    Lab Results  Component Value Date/Time   AST 11 02/25/2014 04:22 PM    Assessment and Plan  Essential hypertension, benign - Plan:advised patient for DASH diet, he is given refill on his medications hydrALAZINE (APRESOLINE) 25 MG  tablet, lisinopril (PRINIVIL,ZESTRIL) 40 MG tablet, will recheck blood chemistry  Chronic kidney disease, unspecified stage - Plan: will repeat blood chemistry, patient to  followup with his nephrologist, COMPLETE METABOLIC PANEL WITH GFR  History of stroke Patient will make a followup appointment with his neurologist continue with aspirin  Smoking Again counseled patient to quit smoking.    Return in about 3 months (around 05/27/2015), or if symptoms worsen or fail to improve.   This note has been created with Surveyor, quantity. Any transcriptional errors are unintentional.    Lorayne Marek, MD

## 2015-02-24 NOTE — Progress Notes (Signed)
Patient here for follow up on his hypertension and chronic kidney disease Patient also requesting refills on his blood pressure medications which he Has been our of for two days

## 2015-02-24 NOTE — Patient Instructions (Signed)
Smoking Cessation Quitting smoking is important to your health and has many advantages. However, it is not always easy to quit since nicotine is a very addictive drug. Oftentimes, people try 3 times or more before being able to quit. This document explains the best ways for you to prepare to quit smoking. Quitting takes hard work and a lot of effort, but you can do it. ADVANTAGES OF QUITTING SMOKING  You will live longer, feel better, and live better.  Your body will feel the impact of quitting smoking almost immediately.  Within 20 minutes, blood pressure decreases. Your pulse returns to its normal level.  After 8 hours, carbon monoxide levels in the blood return to normal. Your oxygen level increases.  After 24 hours, the chance of having a heart attack starts to decrease. Your breath, hair, and body stop smelling like smoke.  After 48 hours, damaged nerve endings begin to recover. Your sense of taste and smell improve.  After 72 hours, the body is virtually free of nicotine. Your bronchial tubes relax and breathing becomes easier.  After 2 to 12 weeks, lungs can hold more air. Exercise becomes easier and circulation improves.  The risk of having a heart attack, stroke, cancer, or lung disease is greatly reduced.  After 1 year, the risk of coronary heart disease is cut in half.  After 5 years, the risk of stroke falls to the same as a nonsmoker.  After 10 years, the risk of lung cancer is cut in half and the risk of other cancers decreases significantly.  After 15 years, the risk of coronary heart disease drops, usually to the level of a nonsmoker.  If you are pregnant, quitting smoking will improve your chances of having a healthy baby.  The people you live with, especially any children, will be healthier.  You will have extra money to spend on things other than cigarettes. QUESTIONS TO THINK ABOUT BEFORE ATTEMPTING TO QUIT You may want to talk about your answers with your  health care provider.  Why do you want to quit?  If you tried to quit in the past, what helped and what did not?  What will be the most difficult situations for you after you quit? How will you plan to handle them?  Who can help you through the tough times? Your family? Friends? A health care provider?  What pleasures do you get from smoking? What ways can you still get pleasure if you quit? Here are some questions to ask your health care provider:  How can you help me to be successful at quitting?  What medicine do you think would be best for me and how should I take it?  What should I do if I need more help?  What is smoking withdrawal like? How can I get information on withdrawal? GET READY  Set a quit date.  Change your environment by getting rid of all cigarettes, ashtrays, matches, and lighters in your home, car, or work. Do not let people smoke in your home.  Review your past attempts to quit. Think about what worked and what did not. GET SUPPORT AND ENCOURAGEMENT You have a better chance of being successful if you have help. You can get support in many ways.  Tell your family, friends, and coworkers that you are going to quit and need their support. Ask them not to smoke around you.  Get individual, group, or telephone counseling and support. Programs are available at local hospitals and health centers. Call   your local health department for information about programs in your area.  Spiritual beliefs and practices may help some smokers quit.  Download a "quit meter" on your computer to keep track of quit statistics, such as how long you have gone without smoking, cigarettes not smoked, and money saved.  Get a self-help book about quitting smoking and staying off tobacco. LEARN NEW SKILLS AND BEHAVIORS  Distract yourself from urges to smoke. Talk to someone, go for a walk, or occupy your time with a task.  Change your normal routine. Take a different route to work.  Drink tea instead of coffee. Eat breakfast in a different place.  Reduce your stress. Take a hot bath, exercise, or read a book.  Plan something enjoyable to do every day. Reward yourself for not smoking.  Explore interactive web-based programs that specialize in helping you quit. GET MEDICINE AND USE IT CORRECTLY Medicines can help you stop smoking and decrease the urge to smoke. Combining medicine with the above behavioral methods and support can greatly increase your chances of successfully quitting smoking.  Nicotine replacement therapy helps deliver nicotine to your body without the negative effects and risks of smoking. Nicotine replacement therapy includes nicotine gum, lozenges, inhalers, nasal sprays, and skin patches. Some may be available over-the-counter and others require a prescription.  Antidepressant medicine helps people abstain from smoking, but how this works is unknown. This medicine is available by prescription.  Nicotinic receptor partial agonist medicine simulates the effect of nicotine in your brain. This medicine is available by prescription. Ask your health care provider for advice about which medicines to use and how to use them based on your health history. Your health care provider will tell you what side effects to look out for if you choose to be on a medicine or therapy. Carefully read the information on the package. Do not use any other product containing nicotine while using a nicotine replacement product.  RELAPSE OR DIFFICULT SITUATIONS Most relapses occur within the first 3 months after quitting. Do not be discouraged if you start smoking again. Remember, most people try several times before finally quitting. You may have symptoms of withdrawal because your body is used to nicotine. You may crave cigarettes, be irritable, feel very hungry, cough often, get headaches, or have difficulty concentrating. The withdrawal symptoms are only temporary. They are strongest  when you first quit, but they will go away within 10-14 days. To reduce the chances of relapse, try to:  Avoid drinking alcohol. Drinking lowers your chances of successfully quitting.  Reduce the amount of caffeine you consume. Once you quit smoking, the amount of caffeine in your body increases and can give you symptoms, such as a rapid heartbeat, sweating, and anxiety.  Avoid smokers because they can make you want to smoke.  Do not let weight gain distract you. Many smokers will gain weight when they quit, usually less than 10 pounds. Eat a healthy diet and stay active. You can always lose the weight gained after you quit.  Find ways to improve your mood other than smoking. FOR MORE INFORMATION  www.smokefree.gov  Document Released: 09/05/2001 Document Revised: 01/26/2014 Document Reviewed: 12/21/2011 ExitCare Patient Information 2015 [employer]. This information is not intended to replace advice given to you by your health care provider. Make sure you discuss any questions you have with your health care provider. DASH Eating Plan DASH stands for "Dietary Approaches to Stop Hypertension." The DASH eating plan is a healthy eating plan that has   been shown to reduce high blood pressure (hypertension). Additional health benefits may include reducing the risk of type 2 diabetes mellitus, heart disease, and stroke. The DASH eating plan may also help with weight loss. WHAT DO I NEED TO KNOW ABOUT THE DASH EATING PLAN? For the DASH eating plan, you will follow these general guidelines:  Choose foods with a percent daily value for sodium of less than 5% (as listed on the food label).  Use salt-free seasonings or herbs instead of table salt or sea salt.  Check with your health care provider or pharmacist before using salt substitutes.  Eat lower-sodium products, often labeled as "lower sodium" or "no salt added."  Eat fresh foods.  Eat more vegetables, fruits, and low-fat dairy  products.  Choose whole grains. Look for the word "whole" as the first word in the ingredient list.  Choose fish and skinless chicken or turkey more often than red meat. Limit fish, poultry, and meat to 6 oz (170 g) each day.  Limit sweets, desserts, sugars, and sugary drinks.  Choose heart-healthy fats.  Limit cheese to 1 oz (28 g) per day.  Eat more home-cooked food and less restaurant, buffet, and fast food.  Limit fried foods.  Cook foods using methods other than frying.  Limit canned vegetables. If you do use them, rinse them well to decrease the sodium.  When eating at a restaurant, ask that your food be prepared with less salt, or no salt if possible. WHAT FOODS CAN I EAT? Seek help from a dietitian for individual calorie needs. Grains Whole grain or whole wheat bread. [place]atross rice. Whole grain or whole wheat pasta. Quinoa, bulgur, and whole grain cereals. Low-sodium cereals. Corn or whole wheat flour tortillas. Whole grain cornbread. Whole grain crackers. Low-sodium crackers. Vegetables Fresh or frozen vegetables (raw, steamed, roasted, or grilled). Low-sodium or reduced-sodium tomato and vegetable juices. Low-sodium or reduced-sodium tomato sauce and paste. Low-sodium or reduced-sodium canned vegetables.  Fruits All fresh, canned (in natural juice), or frozen fruits. Meat and Other Protein Products Ground beef (85% or leaner), grass-fed beef, or beef trimmed of fat. Skinless chicken or turkey. Ground chicken or turkey. Pork trimmed of fat. All fish and seafood. Eggs. Dried beans, peas, or lentils. Unsalted nuts and seeds. Unsalted canned beans. Dairy Low-fat dairy products, such as skim or 1% milk, 2% or reduced-fat cheeses, low-fat ricotta or cottage cheese, or plain low-fat yogurt. Low-sodium or reduced-sodium cheeses. Fats and Oils Tub margarines without trans fats. Light or reduced-fat mayonnaise and salad dressings (reduced sodium). Avocado. Safflower, olive, or canola  oils. Natural peanut or almond butter. Other Unsalted popcorn and pretzels. The items listed above may not be a complete list of recommended foods or beverages. Contact your dietitian for more options. WHAT FOODS ARE NOT RECOMMENDED? Grains White bread. White pasta. White rice. Refined cornbread. Bagels and croissants. Crackers that contain trans fat. Vegetables Creamed or fried vegetables. Vegetables in a cheese sauce. Regular canned vegetables. Regular canned tomato sauce and paste. Regular tomato and vegetable juices. Fruits Dried fruits. Canned fruit in light or heavy syrup. Fruit juice. Meat and Other Protein Products Fatty cuts of meat. Ribs, chicken wings, bacon, sausage, bologna, salami, chitterlings, fatback, hot dogs, bratwurst, and packaged luncheon meats. Salted nuts and seeds. Canned beans with salt. Dairy Whole or 2% milk, cream, half-and-half, and cream cheese. Whole-fat or sweetened yogurt. Full-fat cheeses or blue cheese. Nondairy creamers and whipped toppings. Processed cheese, cheese spreads, or cheese curds. Condiments Onion and garlic salt,   seasoned salt, table salt, and sea salt. Canned and packaged gravies. Worcestershire sauce. Tartar sauce. Barbecue sauce. Teriyaki sauce. Soy sauce, including reduced sodium. Steak sauce. Fish sauce. Oyster sauce. Cocktail sauce. Horseradish. Ketchup and mustard. Meat flavorings and tenderizers. Bouillon cubes. Hot sauce. Tabasco sauce. Marinades. Taco seasonings. Relishes. Fats and Oils Butter, stick margarine, lard, shortening, ghee, and bacon fat. Coconut, palm kernel, or palm oils. Regular salad dressings. Other Pickles and olives. Salted popcorn and pretzels. The items listed above may not be a complete list of foods and beverages to avoid. Contact your dietitian for more information. WHERE CAN I FIND MORE INFORMATION? National Heart, Lung, and Blood Institute: www.nhlbi.nih.gov/health/health-topics/topics/dash/ Document Released:  08/31/2011 Document Revised: 01/26/2014 Document Reviewed: 07/16/2013 ExitCare Patient Information 2015 [employer]. This information is not intended to replace advice given to you by your health care provider. Make sure you discuss any questions you have with your health care provider.  

## 2015-02-25 ENCOUNTER — Telehealth: Payer: Self-pay

## 2015-02-25 LAB — COMPLETE METABOLIC PANEL WITH GFR
ALT: <8 U/L (ref 0–53)
AST: 12 U/L (ref 0–37)
Albumin: 4.1 g/dL (ref 3.5–5.2)
Alkaline Phosphatase: 77 U/L (ref 39–117)
BILIRUBIN TOTAL: 0.4 mg/dL (ref 0.2–1.2)
BUN: 21 mg/dL (ref 6–23)
CALCIUM: 10.6 mg/dL — AB (ref 8.4–10.5)
CO2: 27 mEq/L (ref 19–32)
Chloride: 104 mEq/L (ref 96–112)
Creat: 1.81 mg/dL — ABNORMAL HIGH (ref 0.50–1.35)
GFR, EST AFRICAN AMERICAN: 45 mL/min — AB
GFR, Est Non African American: 39 mL/min — ABNORMAL LOW
GLUCOSE: 108 mg/dL — AB (ref 70–99)
POTASSIUM: 4.3 meq/L (ref 3.5–5.3)
Sodium: 140 mEq/L (ref 135–145)
Total Protein: 6.4 g/dL (ref 6.0–8.3)

## 2015-02-25 NOTE — Telephone Encounter (Signed)
-----   Message from Lorayne Marek, MD sent at 02/25/2015 10:19 AM EDT ----- Call and let the patient know that his potassium level is normal range, his kidney function is stable and slightly improved, patient to follow with his nephrologist.

## 2015-02-25 NOTE — Telephone Encounter (Signed)
Spoke with patient;s wife [name] and she is aware of his lab results

## 2015-06-02 ENCOUNTER — Telehealth: Payer: Self-pay | Admitting: Internal Medicine

## 2015-06-02 NOTE — Telephone Encounter (Signed)
Patient's wife came into office requesting wheelchair and walker, patient has medicare. Please f/u with patient if office visit is needed.

## 2015-08-16 ENCOUNTER — Encounter: Payer: Self-pay | Admitting: Family Medicine

## 2015-08-16 ENCOUNTER — Ambulatory Visit: Payer: Medicare Other | Attending: Family Medicine | Admitting: Family Medicine

## 2015-08-16 VITALS — BP 142/78 | HR 79 | Temp 98.6°F | Resp 16 | Ht 75.5 in | Wt 190.0 lb

## 2015-08-16 DIAGNOSIS — Z0001 Encounter for general adult medical examination with abnormal findings: Secondary | ICD-10-CM | POA: Diagnosis present

## 2015-08-16 DIAGNOSIS — N1832 Chronic kidney disease, stage 3b: Secondary | ICD-10-CM | POA: Insufficient documentation

## 2015-08-16 DIAGNOSIS — Z720 Tobacco use: Secondary | ICD-10-CM | POA: Diagnosis not present

## 2015-08-16 DIAGNOSIS — Z79899 Other long term (current) drug therapy: Secondary | ICD-10-CM | POA: Insufficient documentation

## 2015-08-16 DIAGNOSIS — Z9181 History of falling: Secondary | ICD-10-CM | POA: Insufficient documentation

## 2015-08-16 DIAGNOSIS — F17210 Nicotine dependence, cigarettes, uncomplicated: Secondary | ICD-10-CM | POA: Insufficient documentation

## 2015-08-16 DIAGNOSIS — Z0000 Encounter for general adult medical examination without abnormal findings: Secondary | ICD-10-CM

## 2015-08-16 DIAGNOSIS — I129 Hypertensive chronic kidney disease with stage 1 through stage 4 chronic kidney disease, or unspecified chronic kidney disease: Secondary | ICD-10-CM | POA: Diagnosis not present

## 2015-08-16 DIAGNOSIS — N183 Chronic kidney disease, stage 3 unspecified: Secondary | ICD-10-CM

## 2015-08-16 DIAGNOSIS — R296 Repeated falls: Secondary | ICD-10-CM

## 2015-08-16 DIAGNOSIS — I679 Cerebrovascular disease, unspecified: Secondary | ICD-10-CM | POA: Diagnosis not present

## 2015-08-16 DIAGNOSIS — R229 Localized swelling, mass and lump, unspecified: Secondary | ICD-10-CM | POA: Diagnosis not present

## 2015-08-16 DIAGNOSIS — I10 Essential (primary) hypertension: Secondary | ICD-10-CM

## 2015-08-16 DIAGNOSIS — Z7982 Long term (current) use of aspirin: Secondary | ICD-10-CM | POA: Diagnosis not present

## 2015-08-16 DIAGNOSIS — I6930 Unspecified sequelae of cerebral infarction: Secondary | ICD-10-CM | POA: Insufficient documentation

## 2015-08-16 DIAGNOSIS — N184 Chronic kidney disease, stage 4 (severe): Secondary | ICD-10-CM | POA: Insufficient documentation

## 2015-08-16 HISTORY — DX: Chronic kidney disease, stage 3 unspecified: N18.30

## 2015-08-16 HISTORY — DX: History of falling: Z91.81

## 2015-08-16 LAB — COMPLETE METABOLIC PANEL WITH GFR
ALT: 5 U/L — AB (ref 9–46)
AST: 8 U/L — ABNORMAL LOW (ref 10–35)
Albumin: 4 g/dL (ref 3.6–5.1)
Alkaline Phosphatase: 87 U/L (ref 40–115)
BUN: 20 mg/dL (ref 7–25)
CALCIUM: 10.9 mg/dL — AB (ref 8.6–10.3)
CHLORIDE: 105 mmol/L (ref 98–110)
CO2: 28 mmol/L (ref 20–31)
CREATININE: 1.76 mg/dL — AB (ref 0.70–1.25)
GFR, Est African American: 47 mL/min — ABNORMAL LOW (ref >=60)
GFR, Est Non African American: 40 mL/min — ABNORMAL LOW (ref >=60)
Glucose, Bld: 109 mg/dL — ABNORMAL HIGH (ref 65–99)
Potassium: 5.3 mmol/L (ref 3.5–5.3)
Sodium: 141 mmol/L (ref 135–146)
Total Bilirubin: 0.4 mg/dL (ref 0.2–1.2)
Total Protein: 6.5 g/dL (ref 6.1–8.1)

## 2015-08-16 LAB — CBC
HEMATOCRIT: 38.4 % — AB (ref 39.0–52.0)
Hemoglobin: 12.5 g/dL — ABNORMAL LOW (ref 13.0–17.0)
MCH: 28.9 pg (ref 26.0–34.0)
MCHC: 32.6 g/dL (ref 30.0–36.0)
MCV: 88.9 fL (ref 78.0–100.0)
MPV: 9.4 fL (ref 8.6–12.4)
Platelets: 309 10*3/uL (ref 150–400)
RBC: 4.32 MIL/uL (ref 4.22–5.81)
RDW: 14.7 % (ref 11.5–15.5)
WBC: 8.3 10*3/uL (ref 4.0–10.5)

## 2015-08-16 LAB — TSH: TSH: 0.824 u[IU]/mL (ref 0.350–4.500)

## 2015-08-16 LAB — LIPID PANEL
CHOL/HDL RATIO: 4.1 ratio (ref <=5.0)
Cholesterol: 141 mg/dL (ref 125–200)
HDL: 34 mg/dL — ABNORMAL LOW (ref >=40)
LDL Cholesterol: 84 mg/dL (ref <130)
Triglycerides: 116 mg/dL (ref <150)
VLDL: 23 mg/dL (ref <30)

## 2015-08-16 LAB — VITAMIN B12: Vitamin B-12: 566 pg/mL (ref 211–911)

## 2015-08-16 MED ORDER — LISINOPRIL 40 MG PO TABS: 40.0000 mg | ORAL_TABLET | Freq: Every day | ORAL | Status: DC

## 2015-08-16 MED ORDER — HYDRALAZINE HCL 25 MG PO TABS: 25.0000 mg | ORAL_TABLET | Freq: Two times a day (BID) | ORAL | Status: DC

## 2015-08-16 MED ORDER — AMLODIPINE BESYLATE 10 MG PO TABS: 10.0000 mg | ORAL_TABLET | Freq: Every morning | ORAL | Status: DC

## 2015-08-16 NOTE — Progress Notes (Signed)
F/U HTN Taking medication as prescribed  Complaining of lump on lt side of the neck, with out pain x 3 years  Tobacco user 1 1/2 ppday  No suicide thought in the past two weeks

## 2015-08-16 NOTE — Patient Instructions (Addendum)
Libero was seen today for hypertension.  Diagnoses and all orders for this visit:  Healthcare maintenance -     Flu Vaccine QUAD 36+ mos IM  CKD (chronic kidney disease) stage 3, GFR 30-59 ml/min -     COMPLETE METABOLIC PANEL WITH GFR -     CBC  At high risk for falls -     Commode chair -     Walker standard -     Ambulatory referral to Physical Therapy -     Vitamin B12 -     TSH  Cerebrovascular disease -     Commode chair -     Walker standard -     Ambulatory referral to Physical Therapy -     Lipid Panel  Essential hypertension -     amLODipine (NORVASC) 10 MG tablet; Take 1 tablet (10 mg total) by mouth every morning. -     hydrALAZINE (APRESOLINE) 25 MG tablet; Take 1 tablet (25 mg total) by mouth 2 (two) times daily. -     lisinopril (PRINIVIL,ZESTRIL) 40 MG tablet; Take 1 tablet (40 mg total) by mouth daily.  Smoking cessation support: smoking cessation hotline: 1-800-QUIT-NOW.  Smoking cessation classes are available through [facility]. Call [phone] or visit our website at [email or web address]  F/u in 3 months for HTN  Dr. Adrian Blackwater

## 2015-08-16 NOTE — Progress Notes (Signed)
Subjective:  Patient ID: Darrell [name], male    DOB: 1951-02-21  Age: 64 y.o. MRN: [id number]  CC: Hypertension   HPI Darrell [name] presents for    1. CHRONIC HYPERTENSION  Disease Monitoring  Blood pressure range: not checking   Chest pain: no   Dyspnea: no   Claudication: no   Medication compliance: no  Medication Side Effects  Lightheadedness: yes   Urinary frequency: no   Edema: no     2. Smoking: 1.5 PPD. Not ready to quit. Declines medications to help with quitting at this time.   3. Weakness: in legs. With unsteadiness when standing. Since stroke in 2011. Using a walker at home. Request new walker, wheelchair and commode chair. Had PT initially after stroke. Has had falls at home.   Social History  Substance Use Topics  . Smoking status: Current Every Day Smoker -- 1.00 packs/day for 40 years    Types: Cigarettes  . Smokeless tobacco: Never Used  . Alcohol Use: Yes     Comment: alcohol free for 1 year was drinking 1/2 pint per day     Outpatient Prescriptions Prior to Visit  Medication Sig Dispense Refill  . amLODipine (NORVASC) 10 MG tablet Take 10 mg by mouth every morning.    Marland Kitchen aspirin EC 81 MG tablet Take 81 mg by mouth daily.     . brimonidine (ALPHAGAN P) 0.1 % SOLN Place 1 drop into both eyes 2 (two) times daily.    . dorzolamide-timolol (COSOPT) 22.3-6.8 MG/ML ophthalmic solution Place 1 drop into both eyes 2 (two) times daily.    Marland Kitchen esomeprazole (NEXIUM) 40 MG capsule Take 40 mg by mouth daily before breakfast.    . hydrALAZINE (APRESOLINE) 25 MG tablet Take 1 tablet (25 mg total) by mouth 2 (two) times daily. 360 tablet 3  . HYDROcodone-acetaminophen (NORCO/VICODIN) 5-325 MG per tablet Take 1-2 tablets by mouth every 4 (four) hours as needed for moderate pain. 30 tablet 0  . latanoprost (XALATAN) 0.005 % ophthalmic solution Place 1 drop into both eyes at bedtime.    Marland Kitchen lisinopril (PRINIVIL,ZESTRIL) 40 MG tablet Take 1 tablet (40 mg total) by mouth  daily. 30 tablet 3  . Multiple Vitamins-Minerals (ONE DAILY MULTIVITAMIN MEN) TABS Take 1 tablet by mouth daily.     No facility-administered medications prior to visit.    ROS Review of Systems  Constitutional: Negative for fever, chills, fatigue and unexpected weight change.  Eyes: Negative for visual disturbance.  Respiratory: Negative for cough and shortness of breath.   Cardiovascular: Negative for chest pain, palpitations and leg swelling.  Gastrointestinal: Negative for nausea, vomiting, abdominal pain, diarrhea, constipation and blood in stool.  Endocrine: Negative for polydipsia, polyphagia and polyuria.  Musculoskeletal: Negative for myalgias, back pain, arthralgias, gait problem and neck pain.  Skin: Negative for rash.  Allergic/Immunologic: Negative for immunocompromised state.  Neurological: Positive for weakness (in lower extermities ) and headaches. Negative for light-headedness.  Hematological: Negative for adenopathy. Does not bruise/bleed easily.  Psychiatric/Behavioral: Negative for suicidal ideas, sleep disturbance and dysphoric mood. The patient is not nervous/anxious.     Objective:  BP 143/84 mmHg  Pulse 82  Temp(Src) 98.6 F (37 C) (Oral)  Resp 16  Ht 6' 3.5" (1.918 m)  Wt 190 lb (86.183 kg)  BMI 23.43 kg/m2  SpO2 98%  BP/Weight 08/16/2015 [id number]  Systolic BP [id number]  Diastolic BP 78 90 87  Wt. (Lbs) 190 192.2 -  BMI 23.43  23.4 -   Physical Exam  Constitutional: He appears well-developed and well-nourished. No distress.  Thin adult male  Sitting in wheelchair   HENT:  Head: Normocephalic and atraumatic.  Neck: Normal range of motion. Neck supple.    Cardiovascular: Normal rate, regular rhythm, normal heart sounds and intact distal pulses.   Pulmonary/Chest: Effort normal and breath sounds normal.  Musculoskeletal: He exhibits no edema.  Neurological: He is alert.  Skin: Skin is warm and dry. No rash noted. No erythema.    Psychiatric: He has a normal mood and affect.    Assessment & Plan:   Problem List Items Addressed This Visit    At high risk for falls (Chronic)   Relevant Orders   Commode chair   Walker standard   Ambulatory referral to Physical Therapy   Vitamin B12   TSH   Cerebrovascular disease (Chronic)   Relevant Medications   amLODipine (NORVASC) 10 MG tablet   hydrALAZINE (APRESOLINE) 25 MG tablet   lisinopril (PRINIVIL,ZESTRIL) 40 MG tablet   Other Relevant Orders   Commode chair   Walker standard   Ambulatory referral to Physical Therapy   Lipid Panel   CKD (chronic kidney disease) stage 3, GFR 30-59 ml/min (Chronic)   Relevant Orders   COMPLETE METABOLIC PANEL WITH GFR   CBC   HTN (hypertension) (Chronic)   Relevant Medications   amLODipine (NORVASC) 10 MG tablet   hydrALAZINE (APRESOLINE) 25 MG tablet   lisinopril (PRINIVIL,ZESTRIL) 40 MG tablet   Subcutaneous nodules (Chronic)    A: L posterior neck nodule, suspect lipoma or cyst. 2 cm x 1 cm  P: Monitor        Tobacco abuse (Chronic)    Other Visit Diagnoses    Healthcare maintenance    -  Primary    Relevant Orders    Flu Vaccine QUAD 36+ mos IM (Completed)       No orders of the defined types were placed in this encounter.    Follow-up: No Follow-up on file.   Boykin Nearing MD

## 2015-08-16 NOTE — Assessment & Plan Note (Signed)
A: L posterior neck nodule, suspect lipoma or cyst. 2 cm x 1 cm  P: Monitor

## 2015-08-17 ENCOUNTER — Telehealth: Payer: Self-pay | Admitting: Family Medicine

## 2015-08-17 MED ORDER — ATORVASTATIN CALCIUM 40 MG PO TABS
40.0000 mg | ORAL_TABLET | Freq: Every day | ORAL | Status: DC
Start: 2015-08-17 — End: 2016-05-25

## 2015-08-17 NOTE — Telephone Encounter (Signed)
Patient's wife called stating that the patient also needs a prescription for a wheel chair also, wife would also like to know where she can take the prescriptions for the commode, walker and wheel chair.  Please f/u

## 2015-08-17 NOTE — Telephone Encounter (Signed)
LVM to return call.

## 2015-08-17 NOTE — Addendum Note (Signed)
Addended by: Boykin Nearing on: 08/17/2015 08:53 AM   Modules accepted: Orders, SmartSet

## 2015-09-08 NOTE — Telephone Encounter (Signed)
Patient's wife called stating that the patient also needs a prescription for a wheel chair also, wife would also like to know where she can take the prescriptions for the commode, walker and wheel chair.  Please f/u

## 2015-09-10 NOTE — Telephone Encounter (Signed)
Rx written and given to patient's wife today

## 2015-09-10 NOTE — Telephone Encounter (Signed)
Patient's wife called stating that the patient also needs a prescription for a wheel chair also, wife would also like to know where she can take the prescriptions for the commode, walker and wheel chair.  Please f/u

## 2015-09-17 ENCOUNTER — Ambulatory Visit: Payer: Medicare Other | Admitting: Rehabilitation

## 2015-10-08 ENCOUNTER — Ambulatory Visit: Payer: Medicare Other | Attending: Family Medicine | Admitting: Rehabilitative and Restorative Service Providers"

## 2015-10-08 DIAGNOSIS — R270 Ataxia, unspecified: Secondary | ICD-10-CM | POA: Diagnosis present

## 2015-10-08 DIAGNOSIS — R269 Unspecified abnormalities of gait and mobility: Secondary | ICD-10-CM | POA: Diagnosis present

## 2015-10-08 NOTE — Therapy (Signed)
[facility] [address] [place], [name], [address] Phone: [phone]   Fax:  [phone]  Physical Therapy Evaluation  Patient Details  Name: Darrell [name] MRN: [id number] Date of Birth: 1951/06/22 No Data Recorded  Encounter Date: 10/08/2015      PT End of Session - 10/08/15 2017    Visit Number 1   Number of Visits 16   Date for PT Re-Evaluation 12/04/15   Authorization Type G code every 10th visit with progress note   PT Start Time 1400   PT Stop Time 1450   PT Time Calculation (min) 50 min   Equipment Utilized During Treatment Gait belt   Activity Tolerance Patient tolerated treatment well   Behavior During Therapy [facility] for tasks assessed/performed      Past Medical History  Diagnosis Date  . High cholesterol   . Hypertension   . Stroke [facility])     2011 with residual deficit left sided weakness  . Chronic kidney disease     stage 3 GFR 30-59 ml/min   . GERD (gastroesophageal reflux disease)   . Neuromuscular disorder ([place])     chronic inflammatory demyelinating polyneuropathy   . Tobacco dependence   . Glaucoma   . Dysphagia as late effect of cerebrovascular disease     pts wife states pt has to eat soft foods     Past Surgical History  Procedure Laterality Date  . Shoulder surgery Bilateral 1988, 1998  . Eye surgery    . Inguinal hernia repair Right 11/23/2014    Procedure: right inguinal hernia repair with mesh;  Surgeon: Armandina Gemma, MD;  Location: [facility];  Service: General;  Laterality: Right;  . Insertion of mesh N/A 11/23/2014    Procedure: INSERTION OF MESH;  Surgeon: Armandina Gemma, MD;  Location: [facility];  Service: General;  Laterality: N/A;    There were no vitals filed for this visit.  Visit Diagnosis:  Abnormality of gait  Ataxia      Subjective Assessment - 10/08/15 1403    Subjective "My balance is off and I'm weak in both of my legs."   The patient walks with a walker in the home  with assist from his wife.  The patient stays in the bed most of the day.  He is out of bed 5 hours/day.  The patient uses w/c for mobility outside of the house.     Pertinent History h/o CVA in 2011, CIDP 2012   Patient Stated Goals Improve walking-to walk without needing help (devices or people)    Currently in Pain? No/denies            [facility] PT Assessment - 10/08/15 1409    Assessment   Medical Diagnosis CIDP, h/o CVA, falls   Prior Therapy known to our clinic from prior PT >1 year ago   Precautions   Precautions Fall   Balance Screen   Has the patient fallen in the past 6 months Yes   How many times? 4   Has the patient had a decrease in activity level because of a fear of falling?  Yes   Is the patient reluctant to leave their home because of a fear of falling?  Yes   [place] residence   Living Arrangements Spouse/significant other   Type of [place] One level   [place] - 2 wheels;Bedside commode;Wheelchair -  manual   Prior Function   Level of [place] Needs assistance with ADLs;Needs assistance with gait;Needs assistance with transfers   Vocation On disability   Observation/Other Assessments   Focus on Therapeutic Outcomes (FOTO)  spouse did not complete survey   Sensation   Light Touch Impaired by gross assessment   Additional Comments Patient reports diminished sensation bilateral hands/fingers   Coordination   Gross Motor Movements are Fluid and Coordinated --  Rapid alternating movements demo dyscoordination   Finger Nose Finger Test abnormal with decreased accuracy to target bilaterally   Posture/Postural Control   Posture/Postural Control Postural limitations   Posture Comments Patient with tightness noted through hips and heel cord in standing with posterior lean requiring assist to shift weight anteriorly   Tone   Assessment Location Right Lower  Extremity;Left Lower Extremity   Tone Assessment - Other   Other Tone Location Comments Patient has tremors noted in standing    ROM / Strength   AROM / PROM / Strength AROM;Strength   AROM   Overall AROM Comments Tightness noted throughout LEs   Strength   Overall Strength Comments Gross MMT revealed 3/5 shoulder flex, 4/5 elbow flexion, 4/5 hip flexion, 5/5 knee flexion/extension, 5/5 ankle dorsiflexion bilaterally   Bed Mobility   Bed Mobility Supine to Sit;Sit to Supine   Supine to Sit 5: Supervision   Sit to Supine 5: Supervision   Transfers   Transfers Sit to Stand;Stand to Sit;Stand Pivot Transfers   Sit to Stand 4: Min assist   Sit to Stand Details Tactile cues for posture;Verbal cues for precautions/safety;Verbal cues for safe use of DME/AE;Verbal cues for technique   Stand to Sit 4: Min assist  falls posterior; improves with cues to flex forward    Stand to Sit Details (indicate cue type and reason) Verbal cues for precautions/safety;Tactile cues for posture;Tactile cues for weight shifting   Stand Pivot Transfers 3: Mod assist   Stand Pivot Transfer Details (indicate cue type and reason) with RW   Comments The patient is not fully aware of deficits noted when asked to move to the mat, he perceives he can do this without much physical assistance.  The patient required mod A for safety.  PT asked his spouse how he performed at home and she reported with RW.  The patient required min to mod A with RW.  He needs physical assist to prevent posterior leaning and cues for placement of body within walker.  The patient appears to have decreased coordination for mgmt of RW.   Ambulation/Gait   Ambulation/Gait Yes   Ambulation/Gait Assistance 3: Mod assist;4: Min assist   Ambulation/Gait Assistance Details When patient initially begins ambulating, he requires mod A, but does decrease assist level to min A once he gets into pattern   Ambulation Distance (Feet) 15 Feet   Assistive device  Rolling walker   Gait Pattern Wide base of support;Ataxic  posterior lean during gait, shoulders elevated   Ambulation Surface Level   Gait Comments Patient walks in the home with assist from his wife   Wheelchair Mobility   Wheelchair Mobility --  patient got new manual w/c- PT adjusted length of leg rests   RLE Tone   RLE Tone Hypertonic   RLE Tone   Hypertonic Details clonus   LLE Tone   LLE Tone Hypertonic   LLE Tone   Hypertonic Details clonus    NEUROMUSCULAR RE-EDUCATION: Sit<>stand to RW with min A emphasizing technique of scooting to edge  of mat, correct hand placement, and trunk flexion to move hips posteriorly to prevent trunk posterior lean moving stand>sit.  Standing at countertop performing "wall bumps" with min to mod A and cues for weight shifting posterior to anterior with RW positioned in front of patient with intermittent UE support.  Tactile cues for postural upright.  Educated patient on need to remain out of bed sitting upright for greater amount of time each day to improve trunk control.        PT Education - 10/08/15 2017    Education provided Yes   Education Details demonstrated wall bumps (*to give handout next session) with RW in front, counter behind and wife's assistance   Person(s) Educated Patient   Methods Explanation;Demonstration;Handout   Comprehension Verbalized understanding;Returned demonstration          PT Short Term Goals - 10/08/15 2018    PT SHORT TERM GOAL #1   Title The patient will return demo HEP with assist from his wife/caregiver.   Baseline Target date 11/07/2015   Time 4   Period Weeks   PT SHORT TERM GOAL #2   Title The aptient will ambulate 30 feet with RW with CGA for safety on level surfaces for improved household mobility.   Baseline Target date 11/07/2015   Time 4   Period Weeks   PT SHORT TERM GOAL #3   Title The patient will perform sit<>stand with CGA 5x demonstrating improved consistency for functional  mobility.   Baseline Target date 11/07/2015   Time 4   Period Weeks   PT SHORT TERM GOAL #4   Title The patient will demonstrate ability to transition from prone to quadriped with CGA to work towards floor<>chair transfer simulation.   Baseline Target date 11/07/2015   Time 4   Period Weeks   PT SHORT TERM GOAL #5   Title The patient will stand at countertop and perform reciprocal UE activities with CGA x 30 seconds to demo ADL performance in standing position.   Baseline Target date 11/07/2015   Time 4   Period Weeks           PT Long Term Goals - 10/08/15 2024    PT LONG TERM GOAL #1   Title The patient will transfer w/c<>mat with RW and close supervision.   Baseline Target date 12/04/2015   Time 8   Period Weeks   PT LONG TERM GOAL #2   Title The patient will ambulate x 75 feet with RW and close supervision for improved household mobility.   Baseline Target date 12/04/2015   Time 8   Period Weeks   PT LONG TERM GOAL #3   Title The patient will perform floor<>chair transfer with min A.   Baseline Target date 12/04/2015   Time 8   Period Weeks   PT LONG TERM GOAL #4   Title The patient will return demo HEP with wife or caregiver assistance.   Baseline Target date 12/04/2015   Time 8   Period Weeks   PT LONG TERM GOAL #5   Title The patient will demo ability to stand at countertop with one UE support for 3 minutes with supervision for improved ADL performance.   Baseline Target date 12/04/2015   Time 8   Period Weeks               Plan - 10/08/15 2030    Clinical Impression Statement The patient is a 65 yo male presenting to OP rehab with chronic  deficits from CVA and CIDP.  The patient is at a high risk for falls requiring assistance for all transfers and gait and cues for safety.    Pt will benefit from skilled therapeutic intervention in order to improve on the following deficits Abnormal gait;Decreased balance;Decreased mobility;Decreased strength;Postural  dysfunction;Difficulty walking;Decreased coordination;Decreased safety awareness;Impaired tone;Decreased activity tolerance;Impaired flexibility   Rehab Potential Fair   Clinical Impairments Affecting Rehab Potential Chronic nature of deficits.   PT Frequency 2x / week   PT Duration 8 weeks   PT Treatment/Interventions ADLs/Self Care Home Management;Balance training;Neuromuscular re-education;Patient/family education;Gait training;Functional mobility training;Therapeutic activities;Therapeutic exercise   PT Next Visit Plan Provide HEP (wall bumps, sit<>stand, standing at countertop for balance); consider hamstring/heel cord stretching, gait training, balance/weight shifting tasks   Consulted and Agree with Plan of Care Patient          G-Codes - Nov 06, 2015 15-Jan-2032    Functional Assessment Tool Used Patient requires min to mod A for short distance ambulation x 15 ft with RW, requires assist for all transfers.   Functional Limitation Mobility: Walking and moving around   Mobility: Walking and Moving Around Current Status [phone]) At least 60 percent but less than 80 percent impaired, limited or restricted   Mobility: Walking and Moving Around Goal Status [phone]) At least 40 percent but less than 60 percent impaired, limited or restricted       Problem List Patient Active Problem List   Diagnosis Date Noted  . CKD (chronic kidney disease) stage 3, GFR 30-59 ml/min 08/16/2015  . At high risk for falls 08/16/2015  . Subcutaneous nodules 08/16/2015  . Incarcerated inguinal hernia 11/23/2014  . Prediabetes 2013/11/05  . Inguinal hernia 03/25/2013  . History of CVA with residual deficit 03/25/2013  . HTN (hypertension) 10/09/2012  . Tobacco abuse 10/09/2012    Zaylie Gisler, PT 11-06-15, 8:34 PM  [facility] [address] [place], [name], [address] Phone: [phone]   Fax:  [phone]  Name: Darrell [name] MRN:  [id number] Date of Birth: [birth year]

## 2015-10-11 ENCOUNTER — Ambulatory Visit: Payer: Medicare Other | Admitting: Rehabilitative and Restorative Service Providers"

## 2015-10-11 DIAGNOSIS — R270 Ataxia, unspecified: Secondary | ICD-10-CM

## 2015-10-11 DIAGNOSIS — R269 Unspecified abnormalities of gait and mobility: Secondary | ICD-10-CM | POA: Diagnosis not present

## 2015-10-11 NOTE — Therapy (Signed)
[facility] [address] [place], [name], [address] Phone: [phone]   Fax:  [phone]  Physical Therapy Treatment  Patient Details  Name: Darrell [name] MRN: [id number] Date of Birth: Feb 26, 1951 No Data Recorded  Encounter Date: 10/11/2015      PT End of Session - 10/11/15 1051    Visit Number 2   Number of Visits 16   Date for PT Re-Evaluation 12/04/15   Authorization Type G code every 10th visit with progress note   PT Start Time 0937   PT Stop Time 1018   PT Time Calculation (min) 41 min   Equipment Utilized During Treatment Gait belt   Activity Tolerance Patient tolerated treatment well  Some SOB noticed during session, especially after gait   Behavior During Therapy [facility] for tasks assessed/performed      Past Medical History  Diagnosis Date  . High cholesterol   . Hypertension   . Stroke [facility])     2011 with residual deficit left sided weakness  . Chronic kidney disease     stage 3 GFR 30-59 ml/min   . GERD (gastroesophageal reflux disease)   . Neuromuscular disorder ([place])     chronic inflammatory demyelinating polyneuropathy   . Tobacco dependence   . Glaucoma   . Dysphagia as late effect of cerebrovascular disease     pts wife states pt has to eat soft foods     Past Surgical History  Procedure Laterality Date  . Shoulder surgery Bilateral 1988, 1998  . Eye surgery    . Inguinal hernia repair Right 11/23/2014    Procedure: right inguinal hernia repair with mesh;  Surgeon: Armandina Gemma, MD;  [facility]: [facility];  Service: General;  Laterality: Right;  . Insertion of mesh N/A 11/23/2014    Procedure: INSERTION OF MESH;  Surgeon: Armandina Gemma, MD;  Location: [facility];  Service: General;  Laterality: N/A;    There were no vitals filed for this visit.  Visit Diagnosis:  Abnormality of gait  Ataxia      Subjective Assessment - 10/11/15 1020    Subjective Pt arrives today with wife with no reports  of significant changes since last session. Pt was unable to comply with HEP (wall bumps) due to time constraints.   Patient is accompained by: Family member   Pertinent History h/o CVA in 2011, CIDP 2012   Patient Stated Goals Improve walking-to walk without needing help (devices or people)    Currently in Pain? No/denies            [facility] PT Assessment - 10/11/15 1024    Tone   Assessment Location Right Lower Extremity;Left Lower Extremity   RLE Tone   RLE Tone Modified [place]   RLE Tone   Hypertonic Details Gastroc, hamstrings, quadriceps   Modified [place] Scale for Grading Hypertonia RLE Slight increase in muscle tone, manifested by a catch, followed by minimal resistance throughout the remainder (less than half) of the ROM   LLE Tone   LLE Tone Modified [place]   LLE Tone   Hypertonic Details gastroc, hamstrings, quadriceps   Modified [place] Scale for Grading Hypertonia LLE More marked increase in muscle tone through most of the ROM, but affected part(s) easily moved                     [facility] Adult PT Treatment/Exercise - 10/11/15 1024    Transfers   Transfers Sit to Stand;Stand to Sit;Sit to Supine;Supine  to Sit;Stand Pivot Transfers  RW with stand pivot transfer   Sit to Stand With upper extremity assist;4: Min assist   Sit to Stand Details Visual cues/gestures for precautions/safety;Verbal cues for technique;Verbal cues for safe use of DME/AE;Tactile cues for posture  RW   Stand to Sit 4: Min guard  RW   Stand to Sit Details (indicate cue type and reason) Verbal cues for technique;Verbal cues for precautions/safety;Tactile cues for placement   Supine to Sit 6: Modified independent (Device/Increase time)  Increased time   Sit to Supine 6: Modified independent (Device/Increase time)  Increased time   Comments The pt requires constant verbal and tactile cueing for safety awareness during sit to stand/stand to sit. The pt will attempt to perform sit to  stand without PT command. Verbal and tactile cueing for proper foot placement, scooting to edge of mat, and UE placement. Requires mod A for safety. When asked by PT, spouse reports that pt will attempt to get up on own to go to restroom for BM while she is not home.    Ambulation/Gait   Ambulation/Gait Yes   Ambulation/Gait Assistance 3: Mod assist;4: Min assist   Ambulation/Gait Assistance Details The pt ambulates with mod A for walker management and verbal cueing for increasing step length. He does decrease to min A once gait is initiated. Pt demonstrates most difficulty with walker management, particularly with turning.   Ambulation Distance (Feet) 20 Feet   Assistive device Rolling walker   Gait Pattern Step-to pattern;Decreased step length - right;Decreased step length - left;Decreased hip/knee flexion - right;Decreased hip/knee flexion - left;Decreased dorsiflexion - right;Decreased dorsiflexion - left;Ataxic;Trunk flexed;Wide base of support   Ambulation Surface Level;Indoor   Self-Care   Self-Care Other Self-Care Comments   Other Self-Care Comments  Heel cord stretch in supine leg extended, Hamstring stretch in supine. Caregiver instructed on proper performance of technique for HEP; able to demonstrate proper technique to PT during today's session  Bilateral, 3x30 sec 2x/day   Neuro Re-ed    Neuro Re-ed Details  Wall bumps x10 performed. Pt required verbal, visual, and tactile cues for proper technique and performance. Verbal and visual cues for mirror and red ball across treatment room to facilitate upright posture, tactile cue on sacrum and anterior shoulder to improve pt awareness of upright posture                PT Education - 10/11/15 1046    Education provided Yes   Education Details Pt and caregiver educated on proper technique for stretching bilateral heel cord and hamstrings for HEP. Pt and caregiver also educated on cues to emphasize when performing sit to stands as  part of HEP.    Person(s) Educated Patient;Spouse   Methods Explanation;Demonstration;Verbal cues;Handout   Comprehension Verbalized understanding;Returned demonstration          PT Short Term Goals - 10/08/15 2018    PT SHORT TERM GOAL #1   Title The patient will return demo HEP with assist from his wife/caregiver.   Baseline Target date 11/07/2015   Time 4   Period Weeks   PT SHORT TERM GOAL #2   Title The aptient will ambulate 30 feet with RW with CGA for safety on level surfaces for improved household mobility.   Baseline Target date 11/07/2015   Time 4   Period Weeks   PT SHORT TERM GOAL #3   Title The patient will perform sit<>stand with CGA 5x demonstrating improved consistency for functional mobility.  Baseline Target date 11/07/2015   Time 4   Period Weeks   PT SHORT TERM GOAL #4   Title The patient will demonstrate ability to transition from prone to quadriped with CGA to work towards floor<>chair transfer simulation.   Baseline Target date 11/07/2015   Time 4   Period Weeks   PT SHORT TERM GOAL #5   Title The patient will stand at countertop and perform reciprocal UE activities with CGA x 30 seconds to demo ADL performance in standing position.   Baseline Target date 11/07/2015   Time 4   Period Weeks           PT Long Term Goals - 10/08/15 2024    PT LONG TERM GOAL #1   Title The patient will transfer w/c<>mat with RW and close supervision.   Baseline Target date 12/04/2015   Time 8   Period Weeks   PT LONG TERM GOAL #2   Title The patient will ambulate x 75 feet with RW and close supervision for improved household mobility.   Baseline Target date 12/04/2015   Time 8   Period Weeks   PT LONG TERM GOAL #3   Title The patient will perform floor<>chair transfer with min A.   Baseline Target date 12/04/2015   Time 8   Period Weeks   PT LONG TERM GOAL #4   Title The patient will return demo HEP with wife or caregiver assistance.   Baseline Target date  12/04/2015   Time 8   Period Weeks   PT LONG TERM GOAL #5   Title The patient will demo ability to stand at countertop with one UE support for 3 minutes with supervision for improved ADL performance.   Baseline Target date 12/04/2015   Time 8   Period Weeks               Plan - 10/11/15 1052    Clinical Impression Statement The pt demonstrated progress towards goals during today's session. The pt continues to require verbal, visual, and tactile cues for proper performance and safety during sit to stand, ambulation with RW. Continues to display decreased safety awareness by not waiting for PT command for completion of tasks, particularly sit to stand, indicating risk for falls. Increased tone and spasticity noted in bilateral LE, L > R, likely impacting pt movement patterns. HEP modified including removal of wall bumps due to safety concerns.   Pt will benefit from skilled therapeutic intervention in order to improve on the following deficits Abnormal gait;Decreased balance;Decreased mobility;Decreased strength;Postural dysfunction;Difficulty walking;Decreased coordination;Decreased safety awareness;Impaired tone;Decreased activity tolerance;Impaired flexibility   Rehab Potential Fair   Clinical Impairments Affecting Rehab Potential Chronic nature of deficits.   PT Frequency 2x / week   PT Duration 8 weeks   PT Treatment/Interventions ADLs/Self Care Home Management;Balance training;Neuromuscular re-education;Patient/family education;Gait training;Functional mobility training;Therapeutic activities;Therapeutic exercise   PT Next Visit Plan Review HEP, gait training, balance/weight shifting tasks.        Problem List Patient Active Problem List   Diagnosis Date Noted  . CKD (chronic kidney disease) stage 3, GFR 30-59 ml/min 08/16/2015  . At high risk for falls 08/16/2015  . Subcutaneous nodules 08/16/2015  . Incarcerated inguinal hernia 11/23/2014  . Prediabetes 10/07/2013  .  Inguinal hernia 03/25/2013  . History of CVA with residual deficit 03/25/2013  . HTN (hypertension) 10/09/2012  . Tobacco abuse 10/09/2012  This entire session was performed under direct supervision and direction of a licensed therapist/therapist assistant . I  have personally read, edited and approve of the note as written. WEAVER,CHRISTINA, PT   De Nurse, SPT 10/11/2015, 12:26 PM  [facility] [address] [place], [name], [address] Phone: [phone]   Fax:  [phone]  Name: Darrell [name] MRN: [id number] Date of Birth: [birth year]

## 2015-10-11 NOTE — Patient Instructions (Addendum)
CAREGIVER ASSISTED: Ankle Dorsiflexion    Caregiver cups hand under heel, rests foot on forearm; leans forward to move foot toward head. Hold _20-30__ seconds. _3__ reps.  2 times per day.  Copyright  VHI. All rights reserved.   CAREGIVER ASSISTED: Hamstrings - Supine    Caregiver holds leg at ankle and over knee; raises leg straight. Hold _20-30 seconds. _3__ reps per set. 2 times per day.  Copyright  VHI. All rights reserved.    3 tips to help move sitting to standing: 1) scoot to edge of chair 2) place feet hip width apart within the frame of the walker 3) use your hands to push up and then hold the walker   Try to stay out of bed for 6-7 hours per day (sitting upright)

## 2015-10-12 ENCOUNTER — Telehealth: Payer: Self-pay | Admitting: Rehabilitative and Restorative Service Providers"

## 2015-10-12 NOTE — Telephone Encounter (Signed)
Darrell [name] was evaluated in PT on 10/08/2015.  He would benefit from OT for further ADL assessment for daily activities.  Please submit in EPIC or fax order to [phone] if agree. Thank you for the referral of this patient. Rudell Cobb, MPT

## 2015-10-15 ENCOUNTER — Ambulatory Visit: Payer: Medicare Other | Admitting: Rehabilitative and Restorative Service Providers"

## 2015-10-15 DIAGNOSIS — R269 Unspecified abnormalities of gait and mobility: Secondary | ICD-10-CM | POA: Diagnosis not present

## 2015-10-15 DIAGNOSIS — R270 Ataxia, unspecified: Secondary | ICD-10-CM

## 2015-10-15 NOTE — Therapy (Signed)
[facility] [address] [place], [name], [address] Phone: [phone]   Fax:  [phone]  Physical Therapy Treatment  Patient Details  Name: Darrell [name] DOBIS MRN: [id number] Date of Birth: [birth year] No Data Recorded  Encounter Date: 10/15/2015      PT End of Session - 10/15/15 0901    Visit Number 3   Number of Visits 16   Date for PT Re-Evaluation 12/04/15   Authorization Type G code every 10th visit with progress note   PT Start Time 0850   PT Stop Time 0930   PT Time Calculation (min) 40 min   Equipment Utilized During Treatment Gait belt   Activity Tolerance Patient tolerated treatment well  Some SOB noticed during session, especially after gait   Behavior During Therapy [facility] for tasks assessed/performed      Past Medical History  Diagnosis Date  . High cholesterol   . Hypertension   . Stroke [facility])     2011 with residual deficit left sided weakness  . Chronic kidney disease     stage 3 GFR 30-59 ml/min   . GERD (gastroesophageal reflux disease)   . Neuromuscular disorder ([place])     chronic inflammatory demyelinating polyneuropathy   . Tobacco dependence   . Glaucoma   . Dysphagia as late effect of cerebrovascular disease     pts wife states pt has to eat soft foods     Past Surgical History  Procedure Laterality Date  . Shoulder surgery Bilateral 1988, 1998  . Eye surgery    . Inguinal hernia repair Right 11/23/2014    Procedure: right inguinal hernia repair with mesh;  Surgeon: Armandina Gemma, MD;  Location: [facility];  Service: General;  Laterality: Right;  . Insertion of mesh N/A 11/23/2014    Procedure: INSERTION OF MESH;  Surgeon: Armandina Gemma, MD;  Location: [facility];  Service: General;  Laterality: N/A;    There were no vitals filed for this visit.  Visit Diagnosis:  Abnormality of gait  Ataxia      Subjective Assessment - 10/15/15 0856    Subjective The patient and wife are doing stretches  in the home.     Patient is accompained by: Family member  spouse   Patient Stated Goals Improve walking-to walk without needing help (devices or people)    Currently in Pain? No/denies                         [facility] Adult PT Treatment/Exercise - 10/15/15 0901    Bed Mobility   Bed Mobility --  Patient rolls well   Supine to Sit 6: Modified independent (Device/Increase time)   Sit to Supine 6: Modified independent (Device/Increase time)   Transfers   Transfers Sit to Stand;Stand to Sit;Stand Pivot Transfers   Sit to Stand Details Visual cues/gestures for precautions/safety;Verbal cues for technique;Verbal cues for safe use of DME/AE;Tactile cues for posture  RW   Stand to Sit 4: Min guard   Stand Pivot Transfers 3: Mod assist   Comments The patient requires continued cues for safety awareness during transfers and hand placement/walker placement.   Ambulation/Gait   Ambulation/Gait Yes   Neuro Re-ed    Neuro Re-ed Details  prone to quadriped with CGA for safety; performed quadriped activities with mod A of 1 with tactile cues from another therpaist for L UE positioning performing R and L hip hiking, attempted hip extension with more assist required, UE  reaching.  Also performed tall kneeling with reaching activities.  Sitting coordination moving between 2 targets with LEs adding UE reaching overhead to decreased stabilization through UEs.   Exercises   Exercises Knee/Hip;Ankle   Knee/Hip Exercises: Stretches   Passive Hamstring Stretch Right;Left;30 seconds;3 reps   Passive Hamstring Stretch Limitations Patient needs cues to keep contralateral LE extended to improve stretch   Gastroc Stretch 3 reps;30 seconds  supine   Ankle Exercises: Supine   Other Supine Ankle Exercises P/ROM gastrocs strengthening                  PT Short Term Goals - 10/08/15 2018    PT SHORT TERM GOAL #1   Title The patient will return demo HEP with assist from his wife/caregiver.    Baseline Target date 11/07/2015   Time 4   Period Weeks   PT SHORT TERM GOAL #2   Title The aptient will ambulate 30 feet with RW with CGA for safety on level surfaces for improved household mobility.   Baseline Target date 11/07/2015   Time 4   Period Weeks   PT SHORT TERM GOAL #3   Title The patient will perform sit<>stand with CGA 5x demonstrating improved consistency for functional mobility.   Baseline Target date 11/07/2015   Time 4   Period Weeks   PT SHORT TERM GOAL #4   Title The patient will demonstrate ability to transition from prone to quadriped with CGA to work towards floor<>chair transfer simulation.   Baseline Target date 11/07/2015   Time 4   Period Weeks   PT SHORT TERM GOAL #5   Title The patient will stand at countertop and perform reciprocal UE activities with CGA x 30 seconds to demo ADL performance in standing position.   Baseline Target date 11/07/2015   Time 4   Period Weeks           PT Long Term Goals - 10/08/15 2024    PT LONG TERM GOAL #1   Title The patient will transfer w/c<>mat with RW and close supervision.   Baseline Target date 12/04/2015   Time 8   Period Weeks   PT LONG TERM GOAL #2   Title The patient will ambulate x 75 feet with RW and close supervision for improved household mobility.   Baseline Target date 12/04/2015   Time 8   Period Weeks   PT LONG TERM GOAL #3   Title The patient will perform floor<>chair transfer with min A.   Baseline Target date 12/04/2015   Time 8   Period Weeks   PT LONG TERM GOAL #4   Title The patient will return demo HEP with wife or caregiver assistance.   Baseline Target date 12/04/2015   Time 8   Period Weeks   PT LONG TERM GOAL #5   Title The patient will demo ability to stand at countertop with one UE support for 3 minutes with supervision for improved ADL performance.   Baseline Target date 12/04/2015   Time 8   Period Weeks               Plan - 10/15/15 1553    Clinical Impression  Statement The patient uses UEs for help stabilize trunk during functional tasks and NMR in therapy.  PT working on core stabilization decreasing dependence on UEs during tasks.  During standing activities, patient locks out at UEs and shoulder shrugs in order to stabilize trunk.  Continue working towards [employer] and  LTGs.   PT Next Visit Plan Review HEP, gait training, balance/weight shifting tasks.   Consulted and Agree with Plan of Care Patient        Problem List Patient Active Problem List   Diagnosis Date Noted  . CKD (chronic kidney disease) stage 3, GFR 30-59 ml/min 08/16/2015  . At high risk for falls 08/16/2015  . Subcutaneous nodules 08/16/2015  . Incarcerated inguinal hernia 11/23/2014  . Prediabetes 10/07/2013  . Inguinal hernia 03/25/2013  . History of CVA with residual deficit 03/25/2013  . HTN (hypertension) 10/09/2012  . Tobacco abuse 10/09/2012    Tatianna Ibbotson, PT 10/15/2015, 3:55 PM  [facility] [address] [place], [name], [address] Phone: [phone]   Fax:  [phone]  Name: ELAY TRANI MRN: [id number] Date of Birth: 06/19/1951

## 2015-10-18 ENCOUNTER — Ambulatory Visit: Payer: Medicare Other | Admitting: Rehabilitative and Restorative Service Providers"

## 2015-10-18 DIAGNOSIS — R270 Ataxia, unspecified: Secondary | ICD-10-CM

## 2015-10-18 DIAGNOSIS — R269 Unspecified abnormalities of gait and mobility: Secondary | ICD-10-CM | POA: Diagnosis not present

## 2015-10-18 NOTE — Therapy (Signed)
[facility] [address] [place], [name], [address] Phone: [phone]   Fax:  [phone]  Physical Therapy Treatment  Patient Details  Name: Darrell [name] MRN: [id number] Date of Birth: [birth year] No Data Recorded  Encounter Date: 10/18/2015      PT End of Session - 10/18/15 1048    Visit Number 4   Number of Visits 16   Date for PT Re-Evaluation 12/04/15   Authorization Type G code every 10th visit with progress note   PT Start Time 0932   PT Stop Time 1017   PT Time Calculation (min) 45 min   Equipment Utilized During Treatment Gait belt   Activity Tolerance Patient tolerated treatment well   Behavior During Therapy [facility] for tasks assessed/performed      Past Medical History  Diagnosis Date  . High cholesterol   . Hypertension   . Stroke [facility])     2011 with residual deficit left sided weakness  . Chronic kidney disease     stage 3 GFR 30-59 ml/min   . GERD (gastroesophageal reflux disease)   . Neuromuscular disorder ([place])     chronic inflammatory demyelinating polyneuropathy   . Tobacco dependence   . Glaucoma   . Dysphagia as late effect of cerebrovascular disease     pts wife states pt has to eat soft foods     Past Surgical History  Procedure Laterality Date  . Shoulder surgery Bilateral 1988, 1998  . Eye surgery    . Inguinal hernia repair Right 11/23/2014    Procedure: right inguinal hernia repair with mesh;  Surgeon: Armandina Gemma, MD;  Location: [facility];  Service: General;  Laterality: Right;  . Insertion of mesh N/A 11/23/2014    Procedure: INSERTION OF MESH;  Surgeon: Armandina Gemma, MD;  Location: [facility];  Service: General;  Laterality: N/A;    There were no vitals filed for this visit.  Visit Diagnosis:  Abnormality of gait  Ataxia      Subjective Assessment - 10/18/15 0937    Subjective Pt arrives with wife today with no reports of significant changes. Has been stretching at home.  Reports being tired after last session.   Patient is accompained by: Family member   Pertinent History h/o CVA in 2011, CIDP 2012   Patient Stated Goals Improve walking-to walk without needing help (devices or people)                          [facility] Adult PT Treatment/Exercise - 10/18/15 0001    Transfers   Transfers Sit to Stand   Sit to Stand 4: Min assist;3: Mod assist   Sit to Stand Details Tactile cues for initiation;Tactile cues for sequencing;Visual cues for safe use of DME/AE;Visual cues/gestures for sequencing;Verbal cues for sequencing;Verbal cues for technique;Tactile cues for posture;Verbal cues for precautions/safety   Sit to Stand Details (indicate cue type and reason) Pt perform sit to stand with min A. Pt require mod A from PT for verbal, visual, tactile cueing for appropriate hand placement, technique (nose over toes), and safety (push from [employer])   Stand to Sit 4: Min guard   Comments Pt requires continued cues for safe performance and technique when performing sit to stand. He attempts to sit to stand without locking the brakes on his wheelchair and requires verbal, visual, and tactile cueing to do so. Pt attempts to pull posteriorly on walker when performing sit to stand and  stand to sit transfers.    Ambulation/Gait   Ambulation/Gait Yes   Ambulation/Gait Assistance 3: Mod assist   Ambulation/Gait Assistance Details The pt requires mod A for walker management and safety during ambulation. The pt requires verbal, visual, and tactile cues for walker management especially with turns, and for foot placement for larger steps and to keep feet within RW when stepping for pt safety.   Ambulation Distance (Feet) 40 Feet   Assistive device Rolling walker   Gait Pattern Step-to pattern;Step-through pattern;Decreased stride length;Decreased hip/knee flexion - right;Decreased hip/knee flexion - left;Decreased dorsiflexion - right;Decreased dorsiflexion - left;Right  foot flat;Left foot flat;Ataxic;Trunk flexed   Ambulation Surface Level;Indoor   Gait Comments Ataxic gait, R > L LE. Pt has tremors during gait in bilateral LE during stance phase of each leg   Neuro Re-ed    Neuro Re-ed Details  The following performed in parallel bars with CGA for pt safety. The pt performed weight shifts to L and R 2x8 each with verbal instructions to tap each hip to red theraband and return to midline. Pt required verbal and tactile cues to maintain upright posture. The pt performed bilateral single leg stance while swing leg rolled green foam tube in ant/post direction in 6 in space designated by red therabands. Performed 10x each leg. The pt performed sit to stand from W/C with emphasis on sequencing requiring min A for performance and mod A for cueing from PT.                PT Education - 10/18/15 1043    Education provided Yes   Education Details Pt and caregiver educated on home ambulation. The pt and wife report that he goes to the restroom alone when the wife is not home. The pt and wife were advised that this was not safe, the pt was at high risk for falls, and he should not get up to use the restroom without supervision/assistance from his wife. A bedside commode was discussed with the pt, but he was not receptive to this as an option.   Person(s) Educated Patient;Spouse   Methods Explanation   Comprehension Verbalized understanding          PT Short Term Goals - 10/08/15 2018    PT SHORT TERM GOAL #1   Title The patient will return demo HEP with assist from his wife/caregiver.   Baseline Target date 11/07/2015   Time 4   Period Weeks   PT SHORT TERM GOAL #2   Title The aptient will ambulate 30 feet with RW with CGA for safety on level surfaces for improved household mobility.   Baseline Target date 11/07/2015   Time 4   Period Weeks   PT SHORT TERM GOAL #3   Title The patient will perform sit<>stand with CGA 5x demonstrating improved consistency  for functional mobility.   Baseline Target date 11/07/2015   Time 4   Period Weeks   PT SHORT TERM GOAL #4   Title The patient will demonstrate ability to transition from prone to quadriped with CGA to work towards floor<>chair transfer simulation.   Baseline Target date 11/07/2015   Time 4   Period Weeks   PT SHORT TERM GOAL #5   Title The patient will stand at countertop and perform reciprocal UE activities with CGA x 30 seconds to demo ADL performance in standing position.   Baseline Target date 11/07/2015   Time 4   Period [place]  PT Long Term Goals - 10/08/15 2024    PT LONG TERM GOAL #1   Title The patient will transfer w/c<>mat with RW and close supervision.   Baseline Target date 12/04/2015   Time 8   Period Weeks   PT LONG TERM GOAL #2   Title The patient will ambulate x 75 feet with RW and close supervision for improved household mobility.   Baseline Target date 12/04/2015   Time 8   Period Weeks   PT LONG TERM GOAL #3   Title The patient will perform floor<>chair transfer with min A.   Baseline Target date 12/04/2015   Time 8   Period Weeks   PT LONG TERM GOAL #4   Title The patient will return demo HEP with wife or caregiver assistance.   Baseline Target date 12/04/2015   Time 8   Period Weeks   PT LONG TERM GOAL #5   Title The patient will demo ability to stand at countertop with one UE support for 3 minutes with supervision for improved ADL performance.   Baseline Target date 12/04/2015   Time 8   Period Weeks               Plan - 10/18/15 1048    Clinical Impression Statement The pt continues to demonstrate lack of deficits/safety awareness and carryover with task performance from previous sessions. The pt was able to ambulate 40' but required frequent rest breaks and demonstrated increased fatigue during today's session. He continues to require min to mod A from PT for safe performance of tasks including ambulation with RW and standing balance  tasks. The pt demonstrates tremors in bilateral LEs and throughout his trunk during gait and standing with min/no UE support associate with ataxia that are likely contibuting to his balance deficits. He continues to rely on UEs and shoulder shrugging during ambulation with RW and standing balance tasks. Continue working towards STGs and LTGs   Pt will benefit from skilled therapeutic intervention in order to improve on the following deficits Abnormal gait;Decreased balance;Decreased mobility;Decreased strength;Postural dysfunction;Difficulty walking;Decreased coordination;Decreased safety awareness;Impaired tone;Decreased activity tolerance;Impaired flexibility   Clinical Impairments Affecting Rehab Potential Chronic nature of deficits.   PT Frequency 2x / week   PT Duration 8 weeks   PT Treatment/Interventions ADLs/Self Care Home Management;Balance training;Neuromuscular re-education;Patient/family education;Gait training;Functional mobility training;Therapeutic activities;Therapeutic exercise   PT Next Visit Plan Review sit to stand sequencing and assess for carryover, gait training, balance/weight shifting tasks, coordination exercises   Consulted and Agree with Plan of Care Patient        Problem List Patient Active Problem List   Diagnosis Date Noted  . CKD (chronic kidney disease) stage 3, GFR 30-59 ml/min 08/16/2015  . At high risk for falls 08/16/2015  . Subcutaneous nodules 08/16/2015  . Incarcerated inguinal hernia 11/23/2014  . Prediabetes 10/07/2013  . Inguinal hernia 03/25/2013  . History of CVA with residual deficit 03/25/2013  . HTN (hypertension) 10/09/2012  . Tobacco abuse 10/09/2012  WEAVER,CHRISTINA, PT This entire session was performed under direct supervision and direction of a licensed therapist/therapist assistant . I have personally read, edited and approve of the note as written.   De Nurse, SPT 10/18/2015, 10:58 AM  [facility] [address] [place], [name], [address] Phone: [phone]   Fax:  [phone]  Name: Darrell [name] MRN: [id number] Date of Birth: [birth year]

## 2015-10-22 ENCOUNTER — Ambulatory Visit: Payer: Medicare Other | Admitting: Rehabilitative and Restorative Service Providers"

## 2015-10-22 DIAGNOSIS — R269 Unspecified abnormalities of gait and mobility: Secondary | ICD-10-CM | POA: Diagnosis not present

## 2015-10-22 DIAGNOSIS — R270 Ataxia, unspecified: Secondary | ICD-10-CM

## 2015-10-22 NOTE — Therapy (Signed)
[facility] [address] [place], [name], [address] Phone: [phone]   Fax:  [phone]  Physical Therapy Treatment  Patient Details  Name: Darrell [name] MRN: [id number] Date of Birth: 1950-12-08 No Data Recorded  Encounter Date: 10/22/2015      PT End of Session - 10/22/15 1039    Visit Number 5   Number of Visits 16   Date for PT Re-Evaluation 12/04/15   Authorization Type G code every 10th visit with progress note   PT Start Time 0936   PT Stop Time 1018   PT Time Calculation (min) 42 min   Equipment Utilized During Treatment Gait belt   Activity Tolerance Patient tolerated treatment well   Behavior During Therapy [facility] for tasks assessed/performed      Past Medical History  Diagnosis Date  . High cholesterol   . Hypertension   . Stroke [facility])     2011 with residual deficit left sided weakness  . Chronic kidney disease     stage 3 GFR 30-59 ml/min   . GERD (gastroesophageal reflux disease)   . Neuromuscular disorder ([place])     chronic inflammatory demyelinating polyneuropathy   . Tobacco dependence   . Glaucoma   . Dysphagia as late effect of cerebrovascular disease     pts wife states pt has to eat soft foods     Past Surgical History  Procedure Laterality Date  . Shoulder surgery Bilateral 1988, 1998  . Eye surgery    . Inguinal hernia repair Right 11/23/2014    Procedure: right inguinal hernia repair with mesh;  Surgeon: Armandina Gemma, MD;  Location: [facility];  Service: General;  Laterality: Right;  . Insertion of mesh N/A 11/23/2014    Procedure: INSERTION OF MESH;  Surgeon: Armandina Gemma, MD;  Location: [facility];  Service: General;  Laterality: N/A;    There were no vitals filed for this visit.  Visit Diagnosis:  Abnormality of gait  Ataxia      Subjective Assessment - 10/22/15 1026    Subjective Pt arrives today with his wife with no new complaints of pain or significant changes since last  session. They report compliance with stretching HEP at home.   Patient is accompained by: Family member   Pertinent History h/o CVA in 2011, CIDP 2012   Patient Stated Goals Improve walking-to walk without needing help (devices or people)    Multiple Pain Sites No                         [facility] Adult PT Treatment/Exercise - 10/22/15 1027    Self-Care   Self-Care Other Self-Care Comments   Other Self-Care Comments  Discussed with pt and wife community resources for potential caregiver assistance to decrease caregiver burden and improve pt safety when wife unavailable. Pt refusing outside assistance at this time. Provided handouts on [employer] and [employer].   Therapeutic Activites    Therapeutic Activities ADL's   ADL's Pt performed sit to stand with emphasis 3 step sequencing for proper performance and safety. Pt instructed to lock breaks of W/C if applicable, scoot towards edge of chair, anteriorly flex at trunk, and use bilateral UE assist from arm rests of chair/WC to stand. Following sit to stand, pt ambulated 20' with RW and min A, mod A for RW management and verbal cueing to keep LEs inside RW for pt safety.. Pt then performed stand to  sit with emphasis on sequencing for safe stand to sit and controlled descent. Pt instructions included turning completely until he felt tactile cue of chair on posterior LEs, followed by reaching back to find arm rests with UEs, and verbal cueing for controlled descent. Pt able to perform sit to stand with mod cueing from PT, unable to repeat instructions without leading from PT. Pt able to perform stand to sit with min A from PT, unable to repeat sequencing instructions to PT without PT leading.                PT Education - 10/22/15 1034    Education provided Yes   Education Details Pt and wife educated on the pt ambulating in the home independently and how PT still feels this is unsafe. Pt and wife also provided  potential community resources to decrease caregiver burden on wife when she must run errands and the pt is home alone. Several options were reviewed with the patient regarding independent home ambulation including calling neighbor for assistance and potential community resources that might assist with this matter. Handouts for community resources were given to the pts wife. The pt consistently refused any outside care stating that "he is good."   Person(s) Educated Patient;Spouse   Methods Explanation;Handout   Comprehension Verbalized understanding;Need further instruction          PT Short Term Goals - 10/08/15 2018    PT SHORT TERM GOAL #1   Title The patient will return demo HEP with assist from his wife/caregiver.   Baseline Target date 11/07/2015   Time 4   Period Weeks   PT SHORT TERM GOAL #2   Title The aptient will ambulate 30 feet with RW with CGA for safety on level surfaces for improved household mobility.   Baseline Target date 11/07/2015   Time 4   Period Weeks   PT SHORT TERM GOAL #3   Title The patient will perform sit<>stand with CGA 5x demonstrating improved consistency for functional mobility.   Baseline Target date 11/07/2015   Time 4   Period Weeks   PT SHORT TERM GOAL #4   Title The patient will demonstrate ability to transition from prone to quadriped with CGA to work towards floor<>chair transfer simulation.   Baseline Target date 11/07/2015   Time 4   Period Weeks   PT SHORT TERM GOAL #5   Title The patient will stand at countertop and perform reciprocal UE activities with CGA x 30 seconds to demo ADL performance in standing position.   Baseline Target date 11/07/2015   Time 4   Period Weeks           PT Long Term Goals - 10/08/15 2024    PT LONG TERM GOAL #1   Title The patient will transfer w/c<>mat with RW and close supervision.   Baseline Target date 12/04/2015   Time 8   Period Weeks   PT LONG TERM GOAL #2   Title The patient will ambulate x 75  feet with RW and close supervision for improved household mobility.   Baseline Target date 12/04/2015   Time 8   Period Weeks   PT LONG TERM GOAL #3   Title The patient will perform floor<>chair transfer with min A.   Baseline Target date 12/04/2015   Time 8   Period Weeks   PT LONG TERM GOAL #4   Title The patient will return demo HEP with wife or caregiver assistance.   Baseline Target  date 12/04/2015   Time 8   Period Weeks   PT LONG TERM GOAL #5   Title The patient will demo ability to stand at countertop with one UE support for 3 minutes with supervision for improved ADL performance.   Baseline Target date 12/04/2015   Time 8   Period Weeks               Plan - 10/22/15 1039    Clinical Impression Statement Today's session included review of functional tasks at home that the pt and his wife feel have increased in difficulty since cessation of prior PT. The pt and his wife felt that functional sit to stand<>stand to sit and independent household ambulation short distances in order for the pt to reach the restroom at home were of greatest need with emphasis on foot clearance during gait.  The pt continues to demonstrate inability to wait for PT command for execution of functional tasks and inability to repeat safe sequencing instructions for task performance. Pt requires min to mod A during ambulation and moderate cueing for walker and LE placement during gait.    Pt will benefit from skilled therapeutic intervention in order to improve on the following deficits Abnormal gait;Decreased balance;Decreased mobility;Decreased strength;Postural dysfunction;Difficulty walking;Decreased coordination;Decreased safety awareness;Impaired tone;Decreased activity tolerance;Impaired flexibility   Rehab Potential Fair   Clinical Impairments Affecting Rehab Potential Chronic nature of deficits.   PT Frequency 2x / week   PT Duration 8 weeks   PT Treatment/Interventions ADLs/Self Care Home  Management;Balance training;Neuromuscular re-education;Patient/family education;Gait training;Functional mobility training;Therapeutic activities;Therapeutic exercise   PT Next Visit Plan Review safe sit to stand, stand to sit sequencing with emphasis on pt repeating instructions back to PT, balance/gait tasks emphasizing foot clearance, LE coordination exercises in standing   Consulted and Agree with Plan of Care Patient;Family member/caregiver   Family Member Consulted Wife        Problem List Patient Active Problem List   Diagnosis Date Noted  . CKD (chronic kidney disease) stage 3, GFR 30-59 ml/min 08/16/2015  . At high risk for falls 08/16/2015  . Subcutaneous nodules 08/16/2015  . Incarcerated inguinal hernia 11/23/2014  . Prediabetes 10/07/2013  . Inguinal hernia 03/25/2013  . History of CVA with residual deficit 03/25/2013  . HTN (hypertension) 10/09/2012  . Tobacco abuse 10/09/2012    De Nurse, SPT 10/22/2015, 11:58 AM  [facility] [address] [place], [name], [address] Phone: [phone]   Fax:  [phone]  Name: Darrell [name] MRN: [id number] Date of Birth: 1951-06-08

## 2015-10-25 ENCOUNTER — Other Ambulatory Visit: Payer: Self-pay | Admitting: Internal Medicine

## 2015-10-25 ENCOUNTER — Ambulatory Visit: Payer: Medicare Other | Admitting: Rehabilitative and Restorative Service Providers"

## 2015-10-25 DIAGNOSIS — R269 Unspecified abnormalities of gait and mobility: Secondary | ICD-10-CM

## 2015-10-25 DIAGNOSIS — R270 Ataxia, unspecified: Secondary | ICD-10-CM

## 2015-10-25 MED FILL — LISINOPRIL 40 MG TABLET: 40 | 30 days supply | Qty: 30 | Fill #1

## 2015-10-25 MED FILL — hydrALAZINE HCL 25 MG TABS: 25 | 30 days supply | Qty: 60 | Fill #4

## 2015-10-25 NOTE — Therapy (Signed)
[facility] [address] [place], [name], [address] Phone: [phone]   Fax:  [phone]  Physical Therapy Treatment  Patient Details  Name: Darrell [name] MRN: [id number] Date of Birth: 07-13-1951 No Data Recorded  Encounter Date: 10/25/2015      PT End of Session - 10/25/15 1121    Visit Number 6   Number of Visits 16   Date for PT Re-Evaluation 12/04/15   Authorization Type G code every 10th visit with progress note   PT Start Time 0935   PT Stop Time 1020   PT Time Calculation (min) 45 min   Equipment Utilized During Treatment Gait belt   Activity Tolerance Patient tolerated treatment well   Behavior During Therapy [facility] for tasks assessed/performed      Past Medical History  Diagnosis Date  . High cholesterol   . Hypertension   . Stroke [facility])     2011 with residual deficit left sided weakness  . Chronic kidney disease     stage 3 GFR 30-59 ml/min   . GERD (gastroesophageal reflux disease)   . Neuromuscular disorder ([place])     chronic inflammatory demyelinating polyneuropathy   . Tobacco dependence   . Glaucoma   . Dysphagia as late effect of cerebrovascular disease     pts wife states pt has to eat soft foods     Past Surgical History  Procedure Laterality Date  . Shoulder surgery Bilateral 1988, 1998  . Eye surgery    . Inguinal hernia repair Right 11/23/2014    Procedure: right inguinal hernia repair with mesh;  Surgeon: Armandina Gemma, MD;  [facility]: [facility];  Service: General;  Laterality: Right;  . Insertion of mesh N/A 11/23/2014    Procedure: INSERTION OF MESH;  Surgeon: Armandina Gemma, MD;  Location: [facility];  Service: General;  Laterality: N/A;    There were no vitals filed for this visit.  Visit Diagnosis:  Abnormality of gait  Ataxia      Subjective Assessment - 10/25/15 0938    Subjective The patient and his wife inquire about PT.  They think they have 2 more sessions scheduled.   Patient Stated Goals Improve walking-to walk without needing help (devices or people)    Currently in Pain? No/denies             [facility] Adult PT Treatment/Exercise - 10/25/15 0945    Transfers   Transfers Sit to Stand;Stand to Sit;Stand Pivot Transfers   Sit to Stand 4: Min assist   Sit to Stand Details Verbal cues for sequencing;Verbal cues for technique;Visual cues for safe use of DME/AE   Comments Pt requires cues for w/c locks, foot placement, sequencing of transfers.  He was more consistent with requiring min A at today's session.   Ambulation/Gait   Ambulation/Gait Yes   Ambulation/Gait Assistance 4: Min assist   Ambulation/Gait Assistance Details Pt requires verbal cues for foot placement in RW and decreasing lifting walker from floor during small turns.  He requires verbal cues for stride length and postural control.   Ambulation Distance (Feet) 25 Feet   x 2 repetitions   Assistive device Rolling walker   Gait Pattern Step-to pattern;Step-through pattern;Decreased stride length;Decreased hip/knee flexion - right;Decreased hip/knee flexion - left;Decreased dorsiflexion - right;Decreased dorsiflexion - left;Right foot flat;Left foot flat;Ataxic;Trunk flexed   Ambulation Surface Level;Indoor   Gait Comments Ataxic gait with no tremors at today's session.   Neuro Re-ed    Neuro Re-ed Details  Sitting on physiodisc with removing UE support with min A due to posterior leaning.  With cues progressed to performing single limb lift (march and hold) R and L sides, reaching with UEs and rhythmic stabilization activities with external force applied and then removed with patient having to recover balance "don't let me move your arms."  Seated without disc emphasizing trunk control and removing back support with core engaged.  Standing side-step with CGA at countertop x 10 feet x 4 repetitions-provided for HEP.  Standing reaching activities at [place] and then propped against countertop with  ball toss.  Seated coordination with LEs removing stability through UEs on armrests tapping targets.                PT Education - 10/25/15 1016    Education provided Yes   Education Details HEP: marching without leaning against chair in sitting, ball toss leaning against countertop (recommended extra assist for safety), side step at counter top with assist   Person(s) Educated Patient;Spouse   Methods Explanation;Demonstration;Handout   Comprehension Verbalized understanding;Returned demonstration          PT Short Term Goals - 10/25/15 1121    PT SHORT TERM GOAL #1   Title The patient will return demo HEP with assist from his wife/caregiver.   Baseline Met on 10/25/2015   Time 4   Period Weeks   Status Achieved   PT SHORT TERM GOAL #2   Title The aptient will ambulate 30 feet with RW with CGA for safety on level surfaces for improved household mobility.   Baseline Target date 11/07/2015   Time 4   Period Weeks   Status On-going   PT SHORT TERM GOAL #3   Title The patient will perform sit<>stand with CGA 5x demonstrating improved consistency for functional mobility.   Baseline Target date 11/07/2015   Time 4   Period Weeks   Status On-going   PT SHORT TERM GOAL #4   Title The patient will demonstrate ability to transition from prone to quadriped with CGA to work towards floor<>chair transfer simulation.   Baseline Target date 11/07/2015   Time 4   Period Weeks   Status On-going   PT SHORT TERM GOAL #5   Title The patient will stand at countertop and perform reciprocal UE activities with CGA x 30 seconds to demo ADL performance in standing position.   Baseline Met on 10/25/2015   Time 4   Period Weeks   Status Achieved           PT Long Term Goals - 10/25/15 1122    PT LONG TERM GOAL #1   Title The patient will transfer w/c<>mat with RW and close supervision.   Baseline Target date 12/04/2015   Time 8   Period Weeks   Status On-going   PT LONG TERM GOAL #2    Title The patient will ambulate x 75 feet with RW and close supervision for improved household mobility.   Baseline Target date 12/04/2015   Time 8   Period Weeks   Status On-going   PT LONG TERM GOAL #3   Title The patient will perform floor<>chair transfer with min A.   Baseline Target date 12/04/2015   Time 8   Period Weeks   Status On-going   PT LONG TERM GOAL #4   Title The patient will return demo HEP with wife or caregiver assistance.   Baseline Target date 12/04/2015   Time 8   Period Weeks  Status On-going   PT LONG TERM GOAL #5   Title The patient will demo ability to stand at countertop with one UE support for 3 minutes with supervision for improved ADL performance.   Baseline Patient requires CGA due to postural instability/imbalance.  PT anticipates will continue with need for CGA due to diminished safety awareness and fall risk.   Time 8   Period Weeks   Status Partially Met               Plan - 10/25/15 1123    Clinical Impression Statement Today's session focused on trunk stability during reaching tasks.  He uses UEs to brace and provide trunk control, however that contributes to quicker steps and decreased foot clearance.  PT provided home activities with assist from his wife.  Patient met 2 STGs.  Continue to progress to STGs/LTGs with anticipated early d/c due to patient limited in independence in home due to diminished safety awareness and predicted level of assist at d/c.   PT Next Visit Plan Review safe sit to stand, stand to sit sequencing with emphasis on pt repeating instructions back to PT, balance/gait tasks emphasizing foot clearance, LE coordination exercises in standing   Consulted and Agree with Plan of Care Patient;Family member/caregiver   Family Member Consulted Wife        Problem List Patient Active Problem List   Diagnosis Date Noted  . CKD (chronic kidney disease) stage 3, GFR 30-59 ml/min 08/16/2015  . At high risk for falls  08/16/2015  . Subcutaneous nodules 08/16/2015  . Incarcerated inguinal hernia 11/23/2014  . Prediabetes 10/07/2013  . Inguinal hernia 03/25/2013  . History of CVA with residual deficit 03/25/2013  . HTN (hypertension) 10/09/2012  . Tobacco abuse 10/09/2012    Latoia Eyster, PT 10/25/2015, 11:31 AM  [facility] [address] [place], [name], [address] Phone: [phone]   Fax:  [phone]  Name: Darrell [name] MRN: [id number] Date of Birth: Aug 03, 1951

## 2015-10-25 NOTE — Patient Instructions (Signed)
Marching    Alternate lifting knees as high as is comfortable, as if marching.  MAKE IT MORE CHALLENGING BY NOT USING THE BACK OF THE CHAIR AND LIFTING ARMS STRAIGHT OUT IN FRONT. Repeat __10_ times each leg and hold for 3 seconds. Do __2_ sessions per day.   Copyright  VHI. All rights reserved.  AMBULATION: Side Step    Step sideways.HOLD ONTO A STURDY DRESSER OR COUNTERTOP.  Repeat in opposite direction. HAVE HELP FROM WIFE. Up and down the length of dresser x 4 times. __1-2 times per day.  Copyright  VHI. All rights reserved.  Anterior / Posterior Sway: Ball Toss / Catch    Stand leaning against counter top or sturdy dresser. Toss a ball or balloon.  Have help for balance. Try 10 times.  1-2 times/day. http://bt.exer.us/270   Copyright  VHI. All rights reserved.

## 2015-10-29 ENCOUNTER — Ambulatory Visit: Payer: Medicare Other | Admitting: Rehabilitative and Restorative Service Providers"

## 2015-11-01 ENCOUNTER — Other Ambulatory Visit: Payer: Self-pay | Admitting: Internal Medicine

## 2015-11-01 ENCOUNTER — Ambulatory Visit: Payer: Medicare Other | Admitting: Rehabilitative and Restorative Service Providers"

## 2015-11-05 ENCOUNTER — Ambulatory Visit: Payer: Medicare Other | Admitting: Rehabilitative and Restorative Service Providers"

## 2015-11-08 ENCOUNTER — Ambulatory Visit: Payer: Medicare Other | Admitting: Rehabilitative and Restorative Service Providers"

## 2015-11-11 ENCOUNTER — Other Ambulatory Visit: Payer: Self-pay | Admitting: Internal Medicine

## 2015-11-11 ENCOUNTER — Telehealth: Payer: Self-pay | Admitting: Family Medicine

## 2015-11-11 NOTE — Telephone Encounter (Signed)
Patient needs clarification as to what medications he should be taking....his wife believes he should have 4 prescribed medications but is uncertain....please follow up with patient

## 2015-11-12 ENCOUNTER — Ambulatory Visit: Payer: Medicare Other | Attending: Family Medicine | Admitting: Rehabilitative and Restorative Service Providers"

## 2015-11-12 ENCOUNTER — Ambulatory Visit: Payer: Medicare Other | Admitting: Rehabilitative and Restorative Service Providers"

## 2015-11-12 DIAGNOSIS — R270 Ataxia, unspecified: Secondary | ICD-10-CM | POA: Insufficient documentation

## 2015-11-12 DIAGNOSIS — R269 Unspecified abnormalities of gait and mobility: Secondary | ICD-10-CM | POA: Insufficient documentation

## 2015-11-12 NOTE — Therapy (Signed)
[facility] [address] [place], [name], [address] Phone: [phone]   Fax:  [phone]  Physical Therapy Treatment  Patient Details  Name: Darrell [name] MRN: [id number] Date of Birth: 11-24-1950 No Data Recorded  Encounter Date: 11/12/2015      PT End of Session - 11/12/15 1413    Visit Number 7   Number of Visits 16   Date for PT Re-Evaluation 12/04/15   Authorization Type G code every 10th visit with progress note   PT Start Time 1408   PT Stop Time 1448   PT Time Calculation (min) 40 min   Equipment Utilized During Treatment Gait belt   Activity Tolerance Patient tolerated treatment well   Behavior During Therapy [facility] for tasks assessed/performed      Past Medical History  Diagnosis Date  . High cholesterol   . Hypertension   . Stroke [facility])     2011 with residual deficit left sided weakness  . Chronic kidney disease     stage 3 GFR 30-59 ml/min   . GERD (gastroesophageal reflux disease)   . Neuromuscular disorder ([place])     chronic inflammatory demyelinating polyneuropathy   . Tobacco dependence   . Glaucoma   . Dysphagia as late effect of cerebrovascular disease     pts wife states pt has to eat soft foods     Past Surgical History  Procedure Laterality Date  . Shoulder surgery Bilateral 1988, 1998  . Eye surgery    . Inguinal hernia repair Right 11/23/2014    Procedure: right inguinal hernia repair with mesh;  Surgeon: Armandina Gemma, MD;  Location: [facility];  Service: General;  Laterality: Right;  . Insertion of mesh N/A 11/23/2014    Procedure: INSERTION OF MESH;  Surgeon: Armandina Gemma, MD;  Location: [facility];  Service: General;  Laterality: N/A;    There were no vitals filed for this visit.  Visit Diagnosis:  Abnormality of gait  Ataxia      Subjective Assessment - 11/12/15 1412    Subjective The patient's wife reports that she would like for patient to make up visits that were missed.   Patient Stated Goals Improve walking-to walk without needing help (devices or people)    Currently in Pain? No/denies       Gait: Ambulation with RW and CGA x 35 feet, 50 feet, 35 feet with cues on slowed pace, intentional stepping, and positioning within the RW.  THERAPEUTIC ACTIVITIES: Transitional movements of sit>stand with safety steps and awareness.  Patient required CGA today and demonstrated correct technique with less cuing Floor<>mat transfer with UE support and CGA to min A.       PT Short Term Goals - 11/12/15 1430    PT SHORT TERM GOAL #1   Title The patient will return demo HEP with assist from his wife/caregiver.   Baseline Met on 10/25/2015   Time 4   Period Weeks   Status Achieved   PT SHORT TERM GOAL #2   Title The aptient will ambulate 30 feet with RW with CGA for safety on level surfaces for improved household mobility.   Baseline Met on 11/12/2015, although in the past, the patient has demonstrated inconsistencies.   Time 4   Period Weeks   Status Achieved   PT SHORT TERM GOAL #3   Title The patient will perform sit<>stand with CGA 5x demonstrating improved consistency for functional mobility.   Baseline Met on 11/12/2015, however patient has  had some inconsistencies / variability in performance.   Time 4   Period Weeks   Status Achieved   PT SHORT TERM GOAL #4   Title The patient will demonstrate ability to transition from prone to quadriped with CGA to work towards floor<>chair transfer simulation.   Baseline Met on 11/12/2015   Time 4   Period Weeks   Status Achieved   PT SHORT TERM GOAL #5   Title The patient will stand at countertop and perform reciprocal UE activities with CGA x 30 seconds to demo ADL performance in standing position.   Baseline Met on 10/25/2015   Time 4   Period Weeks   Status Achieved           PT Long Term Goals - 11/12/15 1431    PT LONG TERM GOAL #1   Title The patient will transfer w/c<>mat with RW and close  supervision.   Baseline Target date 12/04/2015   Time 8   Period Weeks   Status On-going   PT LONG TERM GOAL #2   Title The patient will ambulate x 75 feet with RW and close supervision for improved household mobility.   Baseline Target date 12/04/2015   Time 8   Period Weeks   Status On-going   PT LONG TERM GOAL #3   Title The patient will perform floor<>chair transfer with min A.   Baseline Met on 11/12/2015   Time 8   Period Weeks   Status Achieved   PT LONG TERM GOAL #4   Title The patient will return demo HEP with wife or caregiver assistance.   Baseline Target date 12/04/2015   Time 8   Period Weeks   Status On-going   PT LONG TERM GOAL #5   Title The patient will demo ability to stand at countertop with one UE support for 3 minutes with supervision for improved ADL performance.   Baseline Patient requires CGA due to postural instability/imbalance.  PT anticipates will continue with need for CGA due to diminished safety awareness and fall risk.   Time 8   Period Weeks   Status Partially Met               Plan - 11/12/15 1621    Clinical Impression Statement The patient met 5/5 STGs and one LTG.  Today's visit was an improvement over prior sessions with patient reporting he has decided to "think about his movements". He demonstrated a more thoughtful, aware approach to activity and therefore, was able to meet his goals today.  He has cancelled prior 2 weeks appts due to his wife not being able to drive.  PT plans to continue x 3 more visits until 12/04/2015 to finish plan of care working towards [place].   Pt will benefit from skilled therapeutic intervention in order to improve on the following deficits Abnormal gait;Decreased balance;Decreased mobility;Decreased strength;Postural dysfunction;Difficulty walking;Decreased coordination;Decreased safety awareness;Impaired tone;Decreased activity tolerance;Impaired flexibility   Rehab Potential Fair   Clinical Impairments  Affecting Rehab Potential Chronic nature of deficits.   PT Frequency 2x / week   PT Duration 8 weeks   PT Treatment/Interventions ADLs/Self Care Home Management;Balance training;Neuromuscular re-education;Patient/family education;Gait training;Functional mobility training;Therapeutic activities;Therapeutic exercise   PT Next Visit Plan review HEP, work on standing balance near counter for adding to HEP, walking with safety awareness during sit>stand transfers.   Consulted and Agree with Plan of Care Patient;Family member/caregiver   Family Member Consulted Wife        Problem List  Patient Active Problem List   Diagnosis Date Noted  . CKD (chronic kidney disease) stage 3, GFR 30-59 ml/min 08/16/2015  . At high risk for falls 08/16/2015  . Subcutaneous nodules 08/16/2015  . Incarcerated inguinal hernia 11/23/2014  . Prediabetes 10/07/2013  . Inguinal hernia 03/25/2013  . History of CVA with residual deficit 03/25/2013  . HTN (hypertension) 10/09/2012  . Tobacco abuse 10/09/2012    Chavela Justiniano, PT 11/12/2015, 4:24 PM  [facility] [address] [place], [name], [address] Phone: [phone]   Fax:  [phone]  Name: Darrell [name] MRN: [id number] Date of Birth: [birth year]

## 2015-11-17 MED FILL — ATORVASTATIN 40 MG TABLET: 40 | 30 days supply | Qty: 30 | Fill #0

## 2015-11-17 NOTE — Telephone Encounter (Signed)
Return pt phone call Medication information given  Requesting refills on Lipitor  Call was transfer to [place] for refills

## 2015-11-25 ENCOUNTER — Emergency Department (HOSPITAL_COMMUNITY)
Admission: EM | Admit: 2015-11-25 | Discharge: 2015-11-26 | Disposition: A | Discharge status: Home / Self Care | Payer: Medicare Other | Attending: Emergency Medicine | Admitting: Emergency Medicine

## 2015-11-25 ENCOUNTER — Ambulatory Visit (HOSPITAL_BASED_OUTPATIENT_CLINIC_OR_DEPARTMENT_OTHER): Payer: Medicare Other | Admitting: Family Medicine

## 2015-11-25 ENCOUNTER — Encounter: Payer: Self-pay | Admitting: Family Medicine

## 2015-11-25 VITALS — BP 138/82 | HR 83 | Temp 98.4°F | Resp 16 | Ht 76.0 in | Wt 185.0 lb

## 2015-11-25 DIAGNOSIS — Z8719 Personal history of other diseases of the digestive system: Secondary | ICD-10-CM | POA: Insufficient documentation

## 2015-11-25 DIAGNOSIS — R05 Cough: Secondary | ICD-10-CM | POA: Insufficient documentation

## 2015-11-25 DIAGNOSIS — Z7982 Long term (current) use of aspirin: Secondary | ICD-10-CM

## 2015-11-25 DIAGNOSIS — F17210 Nicotine dependence, cigarettes, uncomplicated: Secondary | ICD-10-CM

## 2015-11-25 DIAGNOSIS — E875 Hyperkalemia: Secondary | ICD-10-CM | POA: Insufficient documentation

## 2015-11-25 DIAGNOSIS — Z1159 Encounter for screening for other viral diseases: Secondary | ICD-10-CM

## 2015-11-25 DIAGNOSIS — Z79899 Other long term (current) drug therapy: Secondary | ICD-10-CM

## 2015-11-25 DIAGNOSIS — I10 Essential (primary) hypertension: Secondary | ICD-10-CM | POA: Diagnosis not present

## 2015-11-25 DIAGNOSIS — Z8673 Personal history of transient ischemic attack (TIA), and cerebral infarction without residual deficits: Secondary | ICD-10-CM | POA: Insufficient documentation

## 2015-11-25 DIAGNOSIS — H409 Unspecified glaucoma: Secondary | ICD-10-CM | POA: Insufficient documentation

## 2015-11-25 DIAGNOSIS — Z0000 Encounter for general adult medical examination without abnormal findings: Secondary | ICD-10-CM | POA: Diagnosis not present

## 2015-11-25 DIAGNOSIS — I129 Hypertensive chronic kidney disease with stage 1 through stage 4 chronic kidney disease, or unspecified chronic kidney disease: Secondary | ICD-10-CM | POA: Insufficient documentation

## 2015-11-25 DIAGNOSIS — N183 Chronic kidney disease, stage 3 unspecified: Secondary | ICD-10-CM

## 2015-11-25 DIAGNOSIS — E7800 Pure hypercholesterolemia, unspecified: Secondary | ICD-10-CM | POA: Insufficient documentation

## 2015-11-25 DIAGNOSIS — R799 Abnormal finding of blood chemistry, unspecified: Secondary | ICD-10-CM | POA: Diagnosis present

## 2015-11-25 DIAGNOSIS — Z23 Encounter for immunization: Secondary | ICD-10-CM | POA: Diagnosis not present

## 2015-11-25 LAB — BASIC METABOLIC PANEL
BUN: 23 mg/dL (ref 7–25)
CO2: 26 mmol/L (ref 20–31)
Calcium: 11.6 mg/dL — ABNORMAL HIGH (ref 8.6–10.3)
Chloride: 108 mmol/L (ref 98–110)
Creat: 1.92 mg/dL — ABNORMAL HIGH (ref 0.70–1.25)
GLUCOSE: 102 mg/dL — AB (ref 65–99)
POTASSIUM: 6.8 mmol/L — AB (ref 3.5–5.3)
SODIUM: 143 mmol/L (ref 135–146)

## 2015-11-25 LAB — HEPATITIS C ANTIBODY: HCV AB: NEGATIVE

## 2015-11-25 MED ORDER — ZOSTER VACCINE LIVE 19400 UNT/0.65ML ~~LOC~~ SOLR: 0.6500 mL | Freq: Once | SUBCUTANEOUS | Status: DC

## 2015-11-25 MED ORDER — LISINOPRIL 40 MG PO TABS: 40.0000 mg | ORAL_TABLET | Freq: Every day | ORAL | Status: DC

## 2015-11-25 MED ORDER — AMLODIPINE BESYLATE 10 MG PO TABS: 10.0000 mg | ORAL_TABLET | Freq: Every morning | ORAL | Status: DC

## 2015-11-25 MED ORDER — HYDRALAZINE HCL 25 MG PO TABS: 25.0000 mg | ORAL_TABLET | Freq: Two times a day (BID) | ORAL | Status: DC

## 2015-11-25 NOTE — Progress Notes (Signed)
F/U HTN Stated rt ear clogged possible sinuse infection  No pain today  Tobacco user 1ppday  No suicidal thoughts in the past two weeks

## 2015-11-25 NOTE — Patient Instructions (Addendum)
Darrell [name] was seen today for hypertension.  Diagnoses and all orders for this visit:  Need for hepatitis C screening test -     Hepatitis C antibody, reflex  Essential hypertension -     Basic Metabolic Panel -     amLODipine (NORVASC) 10 MG tablet; Take 1 tablet (10 mg total) by mouth every morning. -     hydrALAZINE (APRESOLINE) 25 MG tablet; Take 1 tablet (25 mg total) by mouth 2 (two) times daily. -     lisinopril (PRINIVIL,ZESTRIL) 40 MG tablet; Take 1 tablet (40 mg total) by mouth daily.  CKD (chronic kidney disease) stage 3, GFR 30-59 ml/min -     Basic Metabolic Panel  Healthcare maintenance -     Ambulatory referral to Gastroenterology   Lab results will be sent to your mychart  F/u in 6 months for HTN  Dr. Adrian Blackwater

## 2015-11-25 NOTE — Assessment & Plan Note (Signed)
A: well controlled Med: compliant P: Continue current regimen   

## 2015-11-25 NOTE — Progress Notes (Signed)
Subjective:  Patient ID: Darrell [name], male    DOB: December 24, 1950  Age: 65 y.o. MRN: [id number]  CC: Hypertension   HPI Darrell [name] has hx of CVA, smoker, here for HTN follow up    1. CHRONIC HYPERTENSION  Disease Monitoring  Blood pressure range: not checking   Chest pain: no   Dyspnea: no   Claudication: no   Medication compliance: yes  Medication Side Effects  Lightheadedness: no   Urinary frequency: no   Edema: no     2. R ear pressure: x 2 weeks. With runny nose and cough. No CP, SOB or fever.   3. HM: addressed. Tdap today. Hep C screen today. GI referral for screening colonoscopy. He has never had one. Amenable. No blood in stool.   Social History  Substance Use Topics  . Smoking status: Current Every Day Smoker -- 1.00 packs/day for 40 years    Types: Cigarettes  . Smokeless tobacco: Never Used  . Alcohol Use: Yes     Comment: alcohol free for 1 year was drinking 1/2 pint per day     Outpatient Prescriptions Prior to Visit  Medication Sig Dispense Refill  . amLODipine (NORVASC) 10 MG tablet Take 1 tablet (10 mg total) by mouth every morning. 90 tablet 2  . aspirin EC 81 MG tablet Take 81 mg by mouth daily.     Marland Kitchen atorvastatin (LIPITOR) 40 MG tablet Take 1 tablet (40 mg total) by mouth daily. 90 tablet 3  . brimonidine (ALPHAGAN P) 0.1 % SOLN Place 1 drop into both eyes 2 (two) times daily.    . dorzolamide-timolol (COSOPT) 22.3-6.8 MG/ML ophthalmic solution Place 1 drop into both eyes 2 (two) times daily.    Marland Kitchen esomeprazole (NEXIUM) 40 MG capsule Take 40 mg by mouth daily before breakfast.    . hydrALAZINE (APRESOLINE) 25 MG tablet Take 1 tablet (25 mg total) by mouth 2 (two) times daily. 180 tablet 2  . latanoprost (XALATAN) 0.005 % ophthalmic solution Place 1 drop into both eyes at bedtime.    Marland Kitchen lisinopril (PRINIVIL,ZESTRIL) 40 MG tablet Take 1 tablet (40 mg total) by mouth daily. 90 tablet 2  . Multiple Vitamins-Minerals (ONE DAILY MULTIVITAMIN MEN) TABS  Take 1 tablet by mouth daily.     No facility-administered medications prior to visit.    ROS Review of Systems  Constitutional: Negative for fever, chills, fatigue and unexpected weight change.  HENT: Positive for congestion and rhinorrhea.   Eyes: Negative for visual disturbance.  Respiratory: Positive for cough. Negative for shortness of breath.   Cardiovascular: Negative for chest pain, palpitations and leg swelling.  Gastrointestinal: Negative for nausea, vomiting, abdominal pain, diarrhea, constipation and blood in stool.  Endocrine: Negative for polydipsia, polyphagia and polyuria.  Musculoskeletal: Negative for myalgias, back pain, arthralgias, gait problem and neck pain.  Skin: Negative for rash.  Allergic/Immunologic: Negative for immunocompromised state.  Neurological: Positive for weakness (in lower extermities ) and headaches. Negative for light-headedness.  Hematological: Negative for adenopathy. Does not bruise/bleed easily.  Psychiatric/Behavioral: Negative for suicidal ideas, sleep disturbance and dysphoric mood. The patient is not nervous/anxious.     Objective:  BP 138/82 mmHg  Pulse 83  Temp(Src) 98.4 F (36.9 C) (Oral)  Resp 16  Ht 6\' 4"  (1.93 m)  Wt 185 lb (83.915 kg)  BMI 22.53 kg/m2  SpO2 83%  BP/Weight 11/25/2015 [id number]  Systolic BP 0000000 [id number]  Diastolic BP 82 78 90  Wt. (Lbs) 185  190 192.2  BMI 22.53 23.43 23.4   Physical Exam  Constitutional: He appears well-developed and well-nourished. No distress.  Thin adult male  Sitting in wheelchair   HENT:  Head: Normocephalic and atraumatic.  Left Ear: Tympanic membrane, external ear and ear canal normal.  Cerumen impacting R ear  Irrigated by RMA  Neck: Normal range of motion. Neck supple.    Cardiovascular: Normal rate, regular rhythm, normal heart sounds and intact distal pulses.   Pulmonary/Chest: Effort normal and breath sounds normal.  Musculoskeletal: He exhibits no edema.    Neurological: He is alert.  Skin: Skin is warm and dry. No rash noted. No erythema.  Psychiatric: He has a normal mood and affect.     Assessment & Plan:   There are no diagnoses linked to this encounter.   Darrell was seen today for hypertension.  Diagnoses and all orders for this visit:  Need for hepatitis C screening test -     Hepatitis C antibody, reflex  Essential hypertension -     Basic Metabolic Panel -     amLODipine (NORVASC) 10 MG tablet; Take 1 tablet (10 mg total) by mouth every morning. -     hydrALAZINE (APRESOLINE) 25 MG tablet; Take 1 tablet (25 mg total) by mouth 2 (two) times daily. -     lisinopril (PRINIVIL,ZESTRIL) 40 MG tablet; Take 1 tablet (40 mg total) by mouth daily.  CKD (chronic kidney disease) stage 3, GFR 30-59 ml/min -     Basic Metabolic Panel  Healthcare maintenance -     Ambulatory referral to Gastroenterology -     zoster vaccine live, PF, (ZOSTAVAX) 09811 UNT/0.65ML injection; Inject 19,400 Units into the skin once.  Other orders -     Tdap vaccine greater than or equal to 7yo IM   Follow-up: No Follow-up on file.   Boykin Nearing [name]

## 2015-11-26 ENCOUNTER — Telehealth: Payer: Self-pay | Admitting: Internal Medicine

## 2015-11-26 ENCOUNTER — Encounter (HOSPITAL_COMMUNITY): Payer: Self-pay | Admitting: Emergency Medicine

## 2015-11-26 ENCOUNTER — Ambulatory Visit: Payer: Medicare Other | Admitting: Rehabilitative and Restorative Service Providers"

## 2015-11-26 LAB — BASIC METABOLIC PANEL
ANION GAP: 8 (ref 5–15)
BUN: 27 mg/dL — AB (ref 6–20)
CHLORIDE: 109 mmol/L (ref 101–111)
CO2: 25 mmol/L (ref 22–32)
Calcium: 10.5 mg/dL — ABNORMAL HIGH (ref 8.9–10.3)
Creatinine, Ser: 2.17 mg/dL — ABNORMAL HIGH (ref 0.61–1.24)
GFR calc Af Amer: 35 mL/min — ABNORMAL LOW (ref >60)
GFR calc non Af Amer: 31 mL/min — ABNORMAL LOW (ref >60)
Glucose, Bld: 109 mg/dL — ABNORMAL HIGH (ref 65–99)
POTASSIUM: 5.4 mmol/L — AB (ref 3.5–5.1)
SODIUM: 142 mmol/L (ref 135–145)

## 2015-11-26 MED ORDER — SODIUM CHLORIDE 0.9 % IV BOLUS (SEPSIS)
1000.0000 mL | Freq: Once | INTRAVENOUS | Status: AC
  Administered 2015-11-26: 1000 mL via INTRAVENOUS

## 2015-11-26 MED ORDER — HYDRALAZINE HCL 25 MG PO TABS: 25.0000 mg | ORAL_TABLET | Freq: Three times a day (TID) | ORAL | Status: DC

## 2015-11-26 MED ORDER — SODIUM POLYSTYRENE SULFONATE 15 GM/60ML PO SUSP
15.0000 g | Freq: Once | ORAL | Status: AC
  Administered 2015-11-26: 15 g via ORAL
  Filled 2015-11-26: qty 60

## 2015-11-26 MED ORDER — SODIUM POLYSTYRENE SULFONATE 15 GM/60ML PO SUSP: 15.0000 g | Freq: Every day | ORAL | Status: AC

## 2015-11-26 NOTE — ED Provider Notes (Signed)
CSN: [id number]     Arrival date & time 11/25/15  2340 History   First MD Initiated Contact with Patient 11/26/15 0158     No chief complaint on file.    (Consider location/radiation/quality/duration/timing/severity/associated sxs/prior Treatment) HPI Darrell [name] is a 65 y.o. male with history of CVA 2011 with left-sided deficits, CK D, comes in for evaluation of abnormal lab value. Patient reports he was called by his PCP earlier today and referred him to the emergency department for evaluation of hyperkalemia (6.8). Patient denies any symptoms. No fevers, chills, headache, vision changes, chest pain, short of breath, muscle cramps, abdominal pain, unusual numbness or weakness. Accompanied by his wife who reports patient is at baseline. No other modifying factors.  Past Medical History  Diagnosis Date  . High cholesterol   . Hypertension   . Stroke [facility])     2011 with residual deficit left sided weakness  . Chronic kidney disease     stage 3 GFR 30-59 ml/min   . GERD (gastroesophageal reflux disease)   . Neuromuscular disorder ([place])     chronic inflammatory demyelinating polyneuropathy   . Tobacco dependence   . Glaucoma   . Dysphagia as late effect of cerebrovascular disease     pts wife states pt has to eat soft foods    Past Surgical History  Procedure Laterality Date  . Shoulder surgery Bilateral 1988, 1998  . Eye surgery    . Inguinal hernia repair Right 11/23/2014    Procedure: right inguinal hernia repair with mesh;  Surgeon: Armandina Gemma, MD;  Location: [facility];  Service: General;  Laterality: Right;  . Insertion of mesh N/A 11/23/2014    Procedure: INSERTION OF MESH;  Surgeon: Armandina Gemma, MD;  Location: [facility];  Service: General;  Laterality: N/A;   Family History  Problem Relation Age of Onset  . Hypertension Mother   . Diabetes Sister   . Hypertension Sister    Social History  Substance Use Topics  . Smoking status: Current Every Day Smoker -- 1.00 packs/day for  40 years    Types: Cigarettes  . Smokeless tobacco: Never Used  . Alcohol Use: Yes     Comment: alcohol free for 1 year was drinking 1/2 pint per day     Review of Systems A 10 point review of systems was completed and was negative except for pertinent positives and negatives as mentioned in the history of present illness     Allergies  [place]acand hct and Shellfish allergy  Home Medications   Prior to Admission medications   Medication Sig Start Date End Date Taking? Authorizing Provider  amLODipine (NORVASC) 10 MG tablet Take 1 tablet (10 mg total) by mouth every morning. 11/25/15  Yes Boykin Nearing, MD  aspirin EC 81 MG tablet Take 81 mg by mouth daily.    Yes Historical Provider, MD  atorvastatin (LIPITOR) 40 MG tablet Take 1 tablet (40 mg total) by mouth daily. 08/17/15  Yes Josalyn Funches, MD  brimonidine (ALPHAGAN P) 0.1 % SOLN Place 1 drop into both eyes 2 (two) times daily.   Yes Historical Provider, MD  dorzolamide-timolol (COSOPT) 22.3-6.8 MG/ML ophthalmic solution Place 1 drop into both eyes 2 (two) times daily.   Yes Historical Provider, MD  hydrALAZINE (APRESOLINE) 25 MG tablet Take 1 tablet (25 mg total) by mouth 2 (two) times daily. 11/25/15  Yes Josalyn Funches, MD  latanoprost (XALATAN) 0.005 % ophthalmic solution Place 1 drop into both eyes at bedtime.  Yes Historical Provider, MD  lisinopril (PRINIVIL,ZESTRIL) 40 MG tablet Take 1 tablet (40 mg total) by mouth daily. 11/25/15  Yes Josalyn Funches, MD  zoster vaccine live, PF, (ZOSTAVAX) 91478 UNT/0.65ML injection Inject 19,400 Units into the skin once. 11/25/15   Josalyn Funches, MD   BP 127/86 mmHg  Pulse 69  Resp 15  SpO2 95% Physical Exam  Constitutional: He is oriented to person, place, and time. He appears well-developed and well-nourished.  Overall well-appearing African-American male  HENT:  Head: Normocephalic and atraumatic.  Mouth/Throat: Oropharynx is clear and moist.  Eyes: Conjunctivae are normal. Pupils  are equal, round, and reactive to light. Right eye exhibits no discharge. Left eye exhibits no discharge. No scleral icterus.  Neck: Neck supple.  Cardiovascular: Normal rate, regular rhythm and normal heart sounds.   Pulmonary/Chest: Effort normal and breath sounds normal. No respiratory distress. He has no wheezes. He has no rales.  Abdominal: Soft. There is no tenderness.  Musculoskeletal: He exhibits no tenderness.  Neurological: He is alert and oriented to person, place, and time.  Cranial Nerves II-XII grossly intact  Skin: Skin is warm and dry. No rash noted.  Psychiatric: He has a normal mood and affect.  Nursing note and vitals reviewed.   ED Course  Procedures (including critical care time) Labs Review Labs Reviewed  BASIC METABOLIC PANEL - Abnormal; Notable for the following:    Potassium 5.4 (*)    Glucose, Bld 109 (*)    BUN 27 (*)    Creatinine, Ser 2.17 (*)    Calcium 10.5 (*)    GFR calc non Af Amer 31 (*)    GFR calc Af Amer 35 (*)    All other components within normal limits    Imaging Review No results found. I have personally reviewed and evaluated these images and lab results as part of my medical decision-making.   EKG Interpretation   Date/Time:  Friday November 26 2015 00:15:52 EST Ventricular Rate:  81 PR Interval:  153 QRS Duration: 91 QT Interval:  359 QTC Calculation: 417 R Axis:   -30 Text Interpretation:  Sinus rhythm Left axis deviation Probable  anteroseptal infarct, old Nonspecific T abnormalities, lateral leads No  significant change since last tracing Confirmed by WARD,  DO, KRISTEN  [phone]) on 11/26/2015 12:23:19 AM     Meds given in ED:  Medications  sodium chloride 0.9 % bolus 1,000 mL (0 mLs Intravenous Stopped 11/26/15 0319)  sodium polystyrene (KAYEXALATE) 15 GM/60ML suspension 15 g (15 g Oral Given 11/26/15 0249)    Discharge Medication List as of 11/26/2015  3:29 AM     Filed Vitals:   11/26/15 0014 11/26/15 0217 11/26/15 0230  11/26/15 0330  BP:  152/101 144/94 127/86  Pulse:   68 69  Resp:  13 13 15   SpO2: 95%  98% 95%    MDM  Darrell [name] is a 65 y.o. male with a history of CKD, CVA in 2011 with left-sided deficits, comes in for evaluation of abnormal lab value. Patient called by PCP earlier and told his potassium was 6.8 and needed to come to ED. In ED potassium is 5.4. Patient is asymptomatic, no EKG changes. His physical exam is unremarkable, vital signs are within normal limits, afebrile. Given 15 mg Kayexalate, 1 L normal saline, instructions to follow-up PCP tomorrow. He verbalizes understanding and agrees with this plan as well as subsequent discharge. Wife at bedside is also amenable to this plan. Prior to patient discharge,  I discussed and reviewed this case with Dr.Ward  Final diagnoses:  Hyperkalemia        Comer Locket, PA-C 11/26/15 [place], DO 11/26/15 [id number]

## 2015-11-26 NOTE — Addendum Note (Signed)
Addended by: Boykin Nearing on: 11/26/2015 08:21 AM   Modules accepted: Miquel Dunn

## 2015-11-26 NOTE — Discharge Instructions (Signed)
Your potassium was slightly elevated, there does not appear to be an emergent cause for this problem. Call your doctor tomorrow for reevaluation. Return to ED for new or worsening symptoms discussed.  Hyperkalemia Hyperkalemia is when you have too much potassium in your blood. Potassium is normally removed (excreted) from your body by your kidneys. If there is too much potassium in your blood, it can affect your heart's ability to function.  CAUSES  Hyperkalemia may be caused by:   Taking in too much potassium. You can do this by:  Using salt substitutes. They contain large amounts of potassium.  Taking potassium supplements.  Eating foods high in potassium.  Excreting too little potassium. This can happen if:  Your kidneys are not working properly. Kidney (renal) disease, including short- or long-term renal failure, is a very common cause of hyperkalemia.  You are taking medicines that lower your excretion of potassium.  You have Addison disease.  You have a urinary tract blockage, such as kidney stones.  You are on treatment to mechanically clean your blood (dialysis) and you skip a treatment.  Releasing a high amount of potassium from your cells into your blood. This can happen with:  Injury to muscles (rhabdomyolysis) or other tissues. Most potassium is stored in your muscles.  Severe burns or infections.  Acidic blood plasma (acidosis). Acidosis can result from many diseases, such as uncontrolled diabetes. RISK FACTORS The most common risk factor of hyperkalemia is kidney disease. Other risk factors of hyperkalemia include:  Addison disease. This is a condition where your glands do not produce enough hormones.  Alcoholism or heavy drug use.   Using certain blood pressure medicines, such as angiotensin-converting enzyme (ACE) inhibitors, angiotensin II receptor blockers (ARBs), or potassium-sparing diuretics such as spironolactone.  Severe injury or burn. SIGNS AND  SYMPTOMS  Oftentimes, there are no signs or symptoms of hyperkalemia. However, when your potassium level becomes high enough, you may experience symptoms such as:  Irregular or very slow heartbeat.  Nausea.  Fatigue.  Tingling of the skin or numbness of the hands or feet.  Muscle weakness.  Fatigue.  Not being able to move (paralysis). You may not have any symptoms of hyperkalemia.  DIAGNOSIS  Hyperkalemia may be diagnosed by:  Physical exam.  Blood tests.  ECG (electrocardiogram).  Discussion of prescription and non-prescription drug use. TREATMENT  Treatment for hyperkalemia is often directed at the underlying cause. In some instances, treatment may include:   Insulin.  Glucose (sugar) and water solution given through a vein (intravenous or IV).  Dialysis.  Medicines to remove the potassium from your body.  Medicines to move calcium from your bloodstream into your tissues. HOME CARE INSTRUCTIONS   Take medicines only as directed by your health care provider.  Do not take any supplements, natural products, herbs, or vitamins without reviewing them with your health care provider. Certain supplements and natural food products can have high amounts of potassium.  Limit your alcohol intake as directed by your health care provider.  Stop illegal drug use. If you need help quitting, ask your health care provider.  Keep all follow-up visits as directed by your health care provider. This is important.  If you have kidney disease, you may need to follow a low potassium diet. A dietitian can help educate you on low potassium foods. SEEK MEDICAL CARE IF:   You notice an irregular or very slow heartbeat.  You feel light-headed.  You feel weak.  You are nauseous.  You  have tingling or numbness in your hands or feet. SEEK IMMEDIATE MEDICAL CARE IF:   You have shortness of breath.  You have chest pain or discomfort.  You pass out.  You have muscle  paralysis. MAKE SURE YOU:   Understand these instructions.  Will watch your condition.  Will get help right away if you are not doing well or get worse.   This information is not intended to replace advice given to you by your health care provider. Make sure you discuss any questions you have with your health care provider.   Document Released: 09/01/2002 Document Revised: 10/02/2014 Document Reviewed: 12/17/2013 Elsevier Interactive Patient Education [employer].

## 2015-11-26 NOTE — ED Notes (Signed)
Patient sent by PCP for hyperkalemia (6.8), patient's only c/o is his 2nd and 3rd fingers on the right hand go numb and his left leg twitches. Patient denies other c/c.

## 2015-11-26 NOTE — Telephone Encounter (Signed)
Call received at 10:28pm last night that patient had a critical potassium of 6.8. Instructed nurse to call patient and send him to the ER for evaluation. Review of charts reveal that patient has been to ER and potassium was 5.4 upon discharge. Note has been routed to PCP---Dr. Adrian Blackwater.  Lance Bosch, NP 11/26/2015 7:16 AM

## 2015-11-26 NOTE — Addendum Note (Signed)
Addended by: Boykin Nearing on: 11/26/2015 08:21 AM   Modules accepted: Orders, SmartSet

## 2015-12-02 ENCOUNTER — Ambulatory Visit: Payer: Medicare Other | Admitting: Rehabilitative and Restorative Service Providers"

## 2015-12-03 ENCOUNTER — Ambulatory Visit: Payer: Medicare Other | Attending: Family Medicine | Admitting: Rehabilitative and Restorative Service Providers"

## 2015-12-03 DIAGNOSIS — R269 Unspecified abnormalities of gait and mobility: Secondary | ICD-10-CM | POA: Insufficient documentation

## 2015-12-03 DIAGNOSIS — R270 Ataxia, unspecified: Secondary | ICD-10-CM | POA: Diagnosis present

## 2015-12-03 NOTE — Therapy (Addendum)
[facility] [address] [place], [name], [address] Phone: [phone]   Fax:  [phone]  Physical Therapy Treatment  Patient Details  Name: Darrell [name] MRN: [id number] Date of Birth: May 15, 1951 No Data Recorded  Encounter Date: 12/03/2015      PT End of Session - 12/03/15 1522    Visit Number 8   Number of Visits 16   Date for PT Re-Evaluation 12/04/15   Authorization Type G code every 10th visit with progress note   PT Start Time 1450   PT Stop Time 1530   PT Time Calculation (min) 40 min   Equipment Utilized During Treatment Gait belt   Activity Tolerance Patient tolerated treatment well   Behavior During Therapy [facility] for tasks assessed/performed      Past Medical History  Diagnosis Date  . High cholesterol   . Hypertension   . Stroke [facility])     2011 with residual deficit left sided weakness  . Chronic kidney disease     stage 3 GFR 30-59 ml/min   . GERD (gastroesophageal reflux disease)   . Neuromuscular disorder ([place])     chronic inflammatory demyelinating polyneuropathy   . Tobacco dependence   . Glaucoma   . Dysphagia as late effect of cerebrovascular disease     pts wife states pt has to eat soft foods     Past Surgical History  Procedure Laterality Date  . Shoulder surgery Bilateral 1988, 1998  . Eye surgery    . Inguinal hernia repair Right 11/23/2014    Procedure: right inguinal hernia repair with mesh;  Surgeon: Armandina Gemma, MD;  Location: [facility] ORS;  Service: General;  Laterality: Right;  . Insertion of mesh N/A 11/23/2014    Procedure: INSERTION OF MESH;  Surgeon: Armandina Gemma, MD;  Location: [facility] ORS;  Service: General;  Laterality: N/A;    There were no vitals filed for this visit.  Visit Diagnosis:  Abnormality of gait  Ataxia      Subjective Assessment - 12/03/15 1452    Subjective The patient was seen at the ED since last visit due to hyperkalemia.  The patient is ready for  today to be his last therapy visit.     Patient is accompained by: Family member   Pertinent History h/o CVA in 2011, CIDP 2012   Patient Stated Goals Improve walking-to walk without needing help (devices or people)    Currently in Pain? No/denies      Gait: Ambulation with RW and CGA x 75 feet with cues on technique and discussion of safety in home.  Gait working on transitional movements between chairs from 8 feet up to 20 feet working on standing, holding walker, walking, and then turning to sit safely in a chair.  THERAPEUTIC ACTIVITIES: Sit<>stand working on technique and steps for safety with emphasis on slowing of speed during movement.  SELF CARE/HOME MANAGEMENT: Discussed post d/c plan for continuing HEP, review of standing balance activities and home safety.        PT Education - 12/03/15 1654    Education provided Yes   Education Details home safety, progressing of home exercises.   Person(s) Educated Patient;Spouse   Methods Explanation   Comprehension Verbalized understanding          PT Short Term Goals - 11/12/15 1430    PT SHORT TERM GOAL #1   Title The patient will return demo HEP with assist from his wife/caregiver.   Baseline Met on  10/25/2015   Time 4   Period Weeks   Status Achieved   PT SHORT TERM GOAL #2   Title The aptient will ambulate 30 feet with RW with CGA for safety on level surfaces for improved household mobility.   Baseline Met on 11/12/2015, although in the past, the patient has demonstrated inconsistencies.   Time 4   Period Weeks   Status Achieved   PT SHORT TERM GOAL #3   Title The patient will perform sit<>stand with CGA 5x demonstrating improved consistency for functional mobility.   Baseline Met on 11/12/2015, however patient has had some inconsistencies / variability in performance.   Time 4   Period Weeks   Status Achieved   PT SHORT TERM GOAL #4   Title The patient will demonstrate ability to transition from prone to  quadriped with CGA to work towards floor<>chair transfer simulation.   Baseline Met on 11/12/2015   Time 4   Period Weeks   Status Achieved   PT SHORT TERM GOAL #5   Title The patient will stand at countertop and perform reciprocal UE activities with CGA x 30 seconds to demo ADL performance in standing position.   Baseline Met on 10/25/2015   Time 4   Period Weeks   Status Achieved           PT Long Term Goals - 12/03/15 1654    PT LONG TERM GOAL #1   Title The patient will transfer w/c<>mat with RW and close supervision.   Baseline Inconsistently performs.   Time 8   Period Weeks   Status Partially Met   PT LONG TERM GOAL #2   Title The patient will ambulate x 75 feet with RW and close supervision for improved household mobility.   Baseline Requires CGA for stability x 75 feet.   Time 8   Period Weeks   Status Partially Met   PT LONG TERM GOAL #3   Title The patient will perform floor<>chair transfer with min A.   Baseline Met on 11/12/2015   Time 8   Period Weeks   Status Achieved   PT LONG TERM GOAL #4   Title The patient will return demo HEP with wife or caregiver assistance.   Baseline Met on 12/03/2015   Time 8   Period Weeks   Status Achieved   PT LONG TERM GOAL #5   Title The patient will demo ability to stand at countertop with one UE support for 3 minutes with supervision for improved ADL performance.   Baseline Patient requires CGA due to postural instability/imbalance.  PT anticipates will continue with need for CGA due to diminished safety awareness and fall risk.   Time 8   Period Weeks   Status Partially Met               Plan - 12/03/15 1655    Clinical Impression Statement The patient met 2 LTGs and partially met 3 LTGs.  PT discharging with instruction for patient to continue working at home on current HEP with wife's assist.   PT Next Visit Plan Discharge today.   Consulted and Agree with Plan of Care Patient;Family member/caregiver    Family Member Consulted Wife        Problem List Patient Active Problem List   Diagnosis Date Noted  . Hyperkalemia 11/26/2015  . CKD (chronic kidney disease) stage 3, GFR 30-59 ml/min 08/16/2015  . At high risk for falls 08/16/2015  . Subcutaneous nodules 08/16/2015  .  Incarcerated inguinal hernia 11/23/2014  . Prediabetes 10/07/2013  . Inguinal hernia 03/25/2013  . History of CVA with residual deficit 03/25/2013  . HTN (hypertension) 10/09/2012  . Tobacco abuse 10/09/2012    ADDENDUM: PHYSICAL THERAPY DISCHARGE SUMMARY  Visits from Start of Care: 8  Current functional level related to goals / functional outcomes: See goals above   Remaining deficits: Abnormality of gait Decreased coordination Safety awareness imbalance   Education / Equipment: HEP, patient/family education, safety in home, fall prevention.  Plan: Patient agrees to discharge.  Patient goals were partially met. Patient is being discharged due to meeting the stated rehab goals.  ?????        Thank you for the referral of this patient. Rudell Cobb, MPT  [place], PT 12/03/2015, 4:58 PM  [facility] [address] [place], [name], [address] Phone: [phone]   Fax:  [phone]  Name: JESTON JUNKINS MRN: [id number] Date of Birth: Jun 09, 1951

## 2015-12-06 MED FILL — hydrALAZINE HCL 25 MG TABS: 25 | 30 days supply | Qty: 60 | Fill #5

## 2015-12-06 MED FILL — ATORVASTATIN 40 MG TABLET: 40 | 30 days supply | Qty: 30 | Fill #1

## 2015-12-13 MED FILL — AMLODIPINE BESYLATE 10 MG T: 10 | 30 days supply | Qty: 30 | Fill #0

## 2015-12-17 MED FILL — LISINOPRIL 40 MG TABLET: 40 | 30 days supply | Qty: 30 | Fill #0

## 2016-02-08 MED FILL — hydrALAZINE HCL 25 MG TABS: 25 | 30 days supply | Qty: 60 | Fill #6

## 2016-02-08 MED FILL — LISINOPRIL 40 MG TABLET: 40 | 30 days supply | Qty: 30 | Fill #1

## 2016-02-08 MED FILL — AMLODIPINE BESYLATE 10 MG T: 10 | 30 days supply | Qty: 30 | Fill #1

## 2016-02-08 MED FILL — ATORVASTATIN 40 MG TABLET: 40 | 30 days supply | Qty: 30 | Fill #2

## 2016-03-13 ENCOUNTER — Other Ambulatory Visit: Payer: Self-pay | Admitting: Internal Medicine

## 2016-03-13 MED FILL — LISINOPRIL 40 MG TABLET: 40 | 30 days supply | Qty: 30 | Fill #2

## 2016-03-13 MED FILL — ATORVASTATIN 40 MG TABLET: 40 | 30 days supply | Qty: 30 | Fill #3

## 2016-03-13 MED FILL — ?AMLODIPINE BESYLATE 10 MG: 10 | 30 days supply | Qty: 30 | Fill #2

## 2016-03-13 MED FILL — hydrALAZINE HCL 25 MG TABS: 25 | 30 days supply | Qty: 60 | Fill #0

## 2016-04-17 MED FILL — ?LISINOPRIL 40 MG TABLET: 40 MG | 30 days supply | Qty: 30 | Fill #3

## 2016-04-17 MED FILL — ?ATORVASTATIN 40MG TABLET: 40 | 30 days supply | Qty: 30 | Fill #4

## 2016-04-17 MED FILL — ?AMLODIPINE BESYLATE 10 MG: 10 | 30 days supply | Qty: 30 | Fill #3

## 2016-04-17 MED FILL — hydrALAZINE HCL 25 MG TABS: 25 | 30 days supply | Qty: 60 | Fill #1

## 2016-05-25 ENCOUNTER — Encounter: Payer: Self-pay | Admitting: Family Medicine

## 2016-05-25 ENCOUNTER — Ambulatory Visit: Payer: Medicare Other | Attending: Family Medicine | Admitting: Family Medicine

## 2016-05-25 VITALS — BP 126/82 | HR 89 | Temp 98.4°F

## 2016-05-25 DIAGNOSIS — M25531 Pain in right wrist: Secondary | ICD-10-CM

## 2016-05-25 DIAGNOSIS — I6930 Unspecified sequelae of cerebral infarction: Secondary | ICD-10-CM

## 2016-05-25 DIAGNOSIS — Z0000 Encounter for general adult medical examination without abnormal findings: Secondary | ICD-10-CM

## 2016-05-25 DIAGNOSIS — Z23 Encounter for immunization: Secondary | ICD-10-CM | POA: Diagnosis not present

## 2016-05-25 DIAGNOSIS — I10 Essential (primary) hypertension: Secondary | ICD-10-CM | POA: Diagnosis not present

## 2016-05-25 DIAGNOSIS — N183 Chronic kidney disease, stage 3 unspecified: Secondary | ICD-10-CM

## 2016-05-25 LAB — CBC
HEMATOCRIT: 39.6 % (ref 38.5–50.0)
HEMOGLOBIN: 13.2 g/dL (ref 13.2–17.1)
MCH: 29.1 pg (ref 27.0–33.0)
MCHC: 33.3 g/dL (ref 32.0–36.0)
MCV: 87.4 fL (ref 80.0–100.0)
MPV: 9.5 fL (ref 7.5–12.5)
Platelets: 321 10*3/uL (ref 140–400)
RBC: 4.53 MIL/uL (ref 4.20–5.80)
RDW: 14.3 % (ref 11.0–15.0)
WBC: 10.5 10*3/uL (ref 3.8–10.8)

## 2016-05-25 LAB — BASIC METABOLIC PANEL WITH GFR
BUN: 24 mg/dL (ref 7–25)
CHLORIDE: 110 mmol/L (ref 98–110)
CO2: 22 mmol/L (ref 20–31)
Calcium: 10.5 mg/dL — ABNORMAL HIGH (ref 8.6–10.3)
Creat: 1.92 mg/dL — ABNORMAL HIGH (ref 0.70–1.25)
GFR, EST AFRICAN AMERICAN: 42 mL/min — AB (ref >=60)
GFR, EST NON AFRICAN AMERICAN: 36 mL/min — AB (ref >=60)
Glucose, Bld: 90 mg/dL (ref 65–99)
POTASSIUM: 4.9 mmol/L (ref 3.5–5.3)
SODIUM: 140 mmol/L (ref 135–146)

## 2016-05-25 MED ORDER — HYDRALAZINE HCL 25 MG PO TABS: 25.0000 mg | ORAL_TABLET | Freq: Three times a day (TID) | ORAL | 3 refills | Status: DC

## 2016-05-25 MED ORDER — ATORVASTATIN CALCIUM 40 MG PO TABS: 40.0000 mg | ORAL_TABLET | Freq: Every day | ORAL | 3 refills | Status: DC

## 2016-05-25 MED ORDER — DICLOFENAC SODIUM 1 % TD GEL: 2.0000 g | Freq: Four times a day (QID) | TRANSDERMAL | 5 refills | Status: DC

## 2016-05-25 MED ORDER — LISINOPRIL 40 MG PO TABS: 40.0000 mg | ORAL_TABLET | Freq: Every day | ORAL | 3 refills | Status: DC

## 2016-05-25 MED ORDER — ASPIRIN EC 81 MG PO TBEC: 81.0000 mg | DELAYED_RELEASE_TABLET | Freq: Every day | ORAL | 3 refills | Status: DC

## 2016-05-25 MED ORDER — AMLODIPINE BESYLATE 10 MG PO TABS: 10.0000 mg | ORAL_TABLET | Freq: Every morning | ORAL | 2 refills | Status: DC

## 2016-05-25 MED FILL — hydrALAZINE HCL 25 MG TABS: 25 | 30 days supply | Qty: 90 | Fill #0

## 2016-05-25 MED FILL — AMLODIPINE BESYLATE 10 MG T: 10 | 30 days supply | Qty: 30 | Fill #0

## 2016-05-25 MED FILL — VOLTAREN 1% GEL: 1 | 12 days supply | Qty: 100 | Fill #0

## 2016-05-25 MED FILL — ?LISINOPRIL 40 MG TABLET: 40 MG | 30 days supply | Qty: 30 | Fill #0

## 2016-05-25 MED FILL — ?ATORVASTATIN 40MG TABLET: 40 | 30 days supply | Qty: 30 | Fill #0

## 2016-05-25 NOTE — Assessment & Plan Note (Signed)
Hx of CVA with L sided weakness that is worsening No home exercises  Referral to PT placed

## 2016-05-25 NOTE — Assessment & Plan Note (Signed)
A: HTN with foot swelling. Suspect dependent edema.  Med: compliant  P: Continue current regimen Labs per orders

## 2016-05-25 NOTE — Patient Instructions (Addendum)
Darrell [name] was seen today for hypertension and foot swelling.  Diagnoses and all orders for this visit:  CKD (chronic kidney disease) stage 3, GFR 30-59 ml/min -     CBC -     PTH, Intact and Calcium -     BASIC METABOLIC PANEL WITH GFR  Essential hypertension -     hydrALAZINE (APRESOLINE) 25 MG tablet; Take 1 tablet (25 mg total) by mouth 3 (three) times daily. -     amLODipine (NORVASC) 10 MG tablet; Take 1 tablet (10 mg total) by mouth every morning. -     lisinopril (PRINIVIL,ZESTRIL) 40 MG tablet; Take 1 tablet (40 mg total) by mouth daily.  History of CVA with residual deficit -     Ambulatory referral to Physical Therapy -     atorvastatin (LIPITOR) 40 MG tablet; Take 1 tablet (40 mg total) by mouth daily. -     aspirin EC 81 MG tablet; Take 1 tablet (81 mg total) by mouth daily.  Right wrist pain -     diclofenac sodium (VOLTAREN) 1 % GEL; Apply 2 g topically 4 (four) times daily.   F/u in 6 months HTN   Dr. Adrian Blackwater  Edema Edema is an abnormal buildup of fluids in your bodytissues. Edema is somewhatdependent on gravity to pull the fluid to the lowest place in your body. That makes the condition more common in the legs and thighs (lower extremities). Painless swelling of the feet and ankles is common and becomes more likely as you get older. It is also common in looser tissues, like around your eyes.  When the affected area is squeezed, the fluid may move out of that spot and leave a dent for a few moments. This dent is called pitting.  CAUSES  There are many possible causes of edema. Eating too much salt and being on your feet or sitting for a long time can cause edema in your legs and ankles. Hot weather may make edema worse. Common medical causes of edema include:  Heart failure.  Liver disease.  Kidney disease.  Weak blood vessels in your legs.  Cancer.  An injury.  Pregnancy.  Some medications.  Obesity. SYMPTOMS  Edema is usually painless.Your skin may  look swollen or shiny.  DIAGNOSIS  Your health care provider may be able to diagnose edema by asking about your medical history and doing a physical exam. You may need to have tests such as X-rays, an electrocardiogram, or blood tests to check for medical conditions that may cause edema.  TREATMENT  Edema treatment depends on the cause. If you have heart, liver, or kidney disease, you need the treatment appropriate for these conditions. General treatment may include:  Elevation of the affected body part above the level of your heart.  Compression of the affected body part. Pressure from elastic bandages or support stockings squeezes the tissues and forces fluid back into the blood vessels. This keeps fluid from entering the tissues.  Restriction of fluid and salt intake.  Use of a water pill (diuretic). These medications are appropriate only for some types of edema. They pull fluid out of your body and make you urinate more often. This gets rid of fluid and reduces swelling, but diuretics can have side effects. Only use diuretics as directed by your health care provider. HOME CARE INSTRUCTIONS   Keep the affected body part above the level of your heart when you are lying down.   Do not sit still or stand for  prolonged periods.   Do not put anything directly under your knees when lying down.  Do not wear constricting clothing or garters on your upper legs.   Exercise your legs to work the fluid back into your blood vessels. This may help the swelling go down.   Wear elastic bandages or support stockings to reduce ankle swelling as directed by your health care provider.   Eat a low-salt diet to reduce fluid if your health care provider recommends it.   Only take medicines as directed by your health care provider. SEEK MEDICAL CARE IF:   Your edema is not responding to treatment.  You have heart, liver, or kidney disease and notice symptoms of edema.  You have edema in your  legs that does not improve after elevating them.   You have sudden and unexplained weight gain. SEEK IMMEDIATE MEDICAL CARE IF:   You develop shortness of breath or chest pain.   You cannot breathe when you lie down.  You develop pain, redness, or warmth in the swollen areas.   You have heart, liver, or kidney disease and suddenly get edema.  You have a fever and your symptoms suddenly get worse. MAKE SURE YOU:   Understand these instructions.  Will watch your condition.  Will get help right away if you are not doing well or get worse.   This information is not intended to replace advice given to you by your health care provider. Make sure you discuss any questions you have with your health care provider.   Document Released: 09/11/2005 Document Revised: 10/02/2014 Document Reviewed: 07/04/2013 Elsevier Interactive Patient Education [employer].

## 2016-05-25 NOTE — Progress Notes (Signed)
Subjective:  Patient ID: Darrell [name], male    DOB: 03/22/1951  Age: 65 y.o. MRN: [id number]  CC: Hypertension and Foot Swelling (both)   HPI ANKITH ISSA has hx of HTN, CKD stage 3, CVA, current smoker presents for   1. CHRONIC HYPERTENSION  Disease Monitoring  Blood pressure range: not checking   Chest pain: no   Dyspnea: no   Claudication: no   Medication compliance: yes  Medication Side Effects  Lightheadedness: no   Urinary frequency: no   Edema: yes, in feet and ankles    Impotence:    Preventitive Healthcare:  Exercise: no   Diet Pattern: regular meals   Salt Restriction: yes  2. History of stroke: stroke with L sided weakness. He uses a wheelchair and walks with a walker. He is not keeping up with exercises at home. His wife notes shortening steps. He has R sided wrist and hand pain for past 4 weeks. He uses his R hand to rise adjust himself in his chair. He is R handed.    Social History  Substance Use Topics  . Smoking status: Current Every Day Smoker    Packs/day: 1.00    Years: 40.00    Types: Cigarettes  . Smokeless tobacco: Never Used  . Alcohol use Yes     Comment: alcohol free for 1 year was drinking 1/2 pint per day      Outpatient Medications Prior to Visit  Medication Sig Dispense Refill  . amLODipine (NORVASC) 10 MG tablet Take 1 tablet (10 mg total) by mouth every morning. 90 tablet 2  . aspirin EC 81 MG tablet Take 81 mg by mouth daily.     Marland Kitchen atorvastatin (LIPITOR) 40 MG tablet Take 1 tablet (40 mg total) by mouth daily. 90 tablet 3  . brimonidine (ALPHAGAN P) 0.1 % SOLN Place 1 drop into both eyes 2 (two) times daily.    . dorzolamide-timolol (COSOPT) 22.3-6.8 MG/ML ophthalmic solution Place 1 drop into both eyes 2 (two) times daily.    . hydrALAZINE (APRESOLINE) 25 MG tablet Take 1 tablet (25 mg total) by mouth 3 (three) times daily. 270 tablet 2  . latanoprost (XALATAN) 0.005 % ophthalmic solution Place 1 drop into both eyes at bedtime.     . zoster vaccine live, PF, (ZOSTAVAX) 29562 UNT/0.65ML injection Inject 19,400 Units into the skin once. 1 each 0   No facility-administered medications prior to visit.     ROS Review of Systems  Constitutional: Negative for chills, fatigue, fever and unexpected weight change.  HENT: Positive for trouble swallowing and voice change. Negative for congestion and rhinorrhea.   Eyes: Negative for visual disturbance.  Respiratory: Negative for cough and shortness of breath.   Cardiovascular: Negative for chest pain, palpitations and leg swelling.  Gastrointestinal: Negative for abdominal pain, blood in stool, constipation, diarrhea, nausea and vomiting.  Endocrine: Negative for polydipsia, polyphagia and polyuria.  Musculoskeletal: Positive for arthralgias. Negative for back pain, gait problem, joint swelling, myalgias and neck pain.  Skin: Negative for rash.  Allergic/Immunologic: Negative for immunocompromised state.  Neurological: Positive for weakness (in lower extermities, L >R ). Negative for light-headedness and headaches.  Hematological: Negative for adenopathy. Does not bruise/bleed easily.  Psychiatric/Behavioral: Negative for dysphoric mood, sleep disturbance and suicidal ideas. The patient is not nervous/anxious.     Objective:  BP 126/82 (BP Location: Left Arm, Patient Position: Sitting, Cuff Size: Small)   Pulse 89   Temp 98.4 F (36.9 C) (Oral)  SpO2 97%   BP/Weight 05/25/2016 123XX123 [id number]  Systolic BP 123XX123 [id number]  Diastolic BP 82 86 82  Wt. (Lbs) - - 185  BMI - - 22.53   Wt Readings from Last 3 Encounters:  11/25/15 185 lb (83.9 kg)  08/16/15 190 lb (86.2 kg)  02/24/15 192 lb 3.2 oz (87.2 kg)    Physical Exam  Constitutional: He appears well-developed and well-nourished. No distress.  Thin adult male  Sitting in wheelchair   HENT:  Head: Normocephalic and atraumatic.  Neck: Normal range of motion. Neck supple.    Cardiovascular: Normal rate,  regular rhythm, normal heart sounds and intact distal pulses.   Pulmonary/Chest: Effort normal and breath sounds normal.  Musculoskeletal: He exhibits no edema.  Neurological: He is alert.  Skin: Skin is warm and dry. No rash noted. No erythema.  Psychiatric: He has a normal mood and affect.     Assessment & Plan:  [place] was seen today for hypertension and foot swelling.  Diagnoses and all orders for this visit:  CKD (chronic kidney disease) stage 3, GFR 30-59 ml/min -     CBC -     PTH, Intact and Calcium -     BASIC METABOLIC PANEL WITH GFR  Essential hypertension -     hydrALAZINE (APRESOLINE) 25 MG tablet; Take 1 tablet (25 mg total) by mouth 3 (three) times daily. -     amLODipine (NORVASC) 10 MG tablet; Take 1 tablet (10 mg total) by mouth every morning. -     lisinopril (PRINIVIL,ZESTRIL) 40 MG tablet; Take 1 tablet (40 mg total) by mouth daily.  History of CVA with residual deficit -     Ambulatory referral to Physical Therapy -     atorvastatin (LIPITOR) 40 MG tablet; Take 1 tablet (40 mg total) by mouth daily. -     aspirin EC 81 MG tablet; Take 1 tablet (81 mg total) by mouth daily.  Right wrist pain -     diclofenac sodium (VOLTAREN) 1 % GEL; Apply 2 g topically 4 (four) times daily.  Healthcare maintenance -     Flu Vaccine QUAD 36+ mos IM   There are no diagnoses linked to this encounter.  No orders of the defined types were placed in this encounter.   Follow-up: Return in about 6 months (around 11/22/2016) for HTN .   Boykin Nearing MD

## 2016-05-25 NOTE — Progress Notes (Signed)
C/C: both feet are swelling, right wirst pain Refills are needed on all medications.

## 2016-05-26 LAB — PTH, INTACT AND CALCIUM
CALCIUM: 10.5 mg/dL — AB (ref 8.6–10.3)
PTH: 123 pg/mL — AB (ref 14–64)

## 2016-06-05 ENCOUNTER — Other Ambulatory Visit: Payer: Self-pay | Admitting: Family Medicine

## 2016-06-05 DIAGNOSIS — N183 Chronic kidney disease, stage 3 unspecified: Secondary | ICD-10-CM

## 2016-06-16 ENCOUNTER — Encounter: Payer: Self-pay | Admitting: Physical Therapy

## 2016-06-16 ENCOUNTER — Ambulatory Visit: Payer: Medicare Other | Attending: Family Medicine | Admitting: Physical Therapy

## 2016-06-16 DIAGNOSIS — R2689 Other abnormalities of gait and mobility: Secondary | ICD-10-CM | POA: Diagnosis not present

## 2016-06-16 DIAGNOSIS — R29818 Other symptoms and signs involving the nervous system: Secondary | ICD-10-CM | POA: Diagnosis present

## 2016-06-16 DIAGNOSIS — R2681 Unsteadiness on feet: Secondary | ICD-10-CM

## 2016-06-16 DIAGNOSIS — R278 Other lack of coordination: Secondary | ICD-10-CM | POA: Insufficient documentation

## 2016-06-16 NOTE — Therapy (Signed)
[facility] [address] [place], [name], [address] Phone: [phone]   Fax:  [phone]  Physical Therapy Evaluation  Patient Details  Name: Darrell [name] MRN: [id number] Date of Birth: 09-22-1951 Referring Provider: Boykin Nearing, MD  Encounter Date: 06/16/2016      PT End of Session - 06/16/16 1852    Visit Number 1   Number of Visits 9  eval + 8 visits   Date for PT Re-Evaluation 07/16/16   Authorization Type G code every 10th visit with progress note   PT Start Time 1445   PT Stop Time 1530   PT Time Calculation (min) 45 min   Equipment Utilized During Treatment Gait belt   Activity Tolerance Patient tolerated treatment well   Behavior During Therapy [facility] for tasks assessed/performed      Past Medical History:  Diagnosis Date  . Chronic kidney disease    stage 3 GFR 30-59 ml/min   . Dysphagia as late effect of cerebrovascular disease    pts wife states pt has to eat soft foods   . GERD (gastroesophageal reflux disease)   . Glaucoma   . High cholesterol   . Hypertension   . Neuromuscular disorder ([place])    chronic inflammatory demyelinating polyneuropathy   . Stroke [facility])    2011 with residual deficit left sided weakness  . Tobacco dependence     Past Surgical History:  Procedure Laterality Date  . EYE SURGERY    . INGUINAL HERNIA REPAIR Right 11/23/2014   Procedure: right inguinal hernia repair with mesh;  Surgeon: Armandina Gemma, MD;  Location: [facility];  Service: General;  Laterality: Right;  . INSERTION OF MESH N/A 11/23/2014   Procedure: INSERTION OF MESH;  Surgeon: Armandina Gemma, MD;  Location: [facility];  Service: General;  Laterality: N/A;  . SHOULDER SURGERY Bilateral 1988, 1998    There were no vitals filed for this visit.       Subjective Assessment - 06/16/16 1458    Subjective "My balance is off...and I want to work on walking." Pt was DC'ed from outpatient PT in 11/2015. Currently  ambulates from bed <> bathroom using RW with assist of wife..    Patient is accompained by: Family member  spouse, [name]   Pertinent History h/o CVA in 2011 with L-sided weakness, CIDP 2012   Patient Stated Goals Improve walking-to walk without needing help (devices or people); and ot ride a bicycle   Currently in Pain? No/denies            [facility] PT Assessment - 06/16/16 0001      Assessment   Medical Diagnosis History of CVA with residual deficit   Referring Provider Boykin Nearing, MD   Onset Date/Surgical Date 05/26/16     Precautions   Precautions Fall     Restrictions   Weight Bearing Restrictions No     Balance Screen   Has the patient fallen in the past 6 months No   How many times? 0   Has the patient had a decrease in activity level because of a fear of falling?  Yes   Is the patient reluctant to leave their home because of a fear of falling?  Yes     [place] Private residence   Living Arrangements Spouse/significant other   Type of [place] One level   [place] - 2 wheels  Prior Function   Level of Independence Needs assistance with ADLs;Needs assistance with gait;Needs assistance with transfers   Vocation On disability     Cognition   Overall Cognitive Status History of cognitive impairments - at baseline     Sensation   Light Touch Appears Intact  in BLE's     Coordination   Gross Motor Movements are Fluid and Coordinated No   Heel Shin Test impaired excursion and smoothness of movement in BLE's.     Posture/Postural Control   Posture/Postural Control Postural limitations   Postural Limitations Increased thoracic kyphosis;Posterior pelvic tilt;Weight shift right     ROM / Strength   AROM / PROM / Strength AROM;PROM;Strength     AROM   Overall AROM  Deficits   Overall AROM Comments Limited B hip extension, knee extension, ankle DF due to limited  muscle extensibility.      PROM   Overall PROM  Deficits   Overall PROM Comments Limited B hip extension, knee extension, ankle DF due to limited muscle extensibility.     Strength   Overall Strength Deficits   Overall Strength Comments RLE strength grossly WFL (gross assessment performed in seated). L hip flexion, knee flexion/extension, and ankle PF all grossly 4/5.   Strength Assessment Site --   Right/Left Hip --   Right/Left Knee --   Right/Left Ankle --     Bed Mobility   Supine to Sit 6: Modified independent (Device/Increase time)   Sit to Supine 6: Modified independent (Device/Increase time)     Transfers   Transfers Sit to Stand;Stand to Sit   Sit to Stand 4: Min assist   Sit to Stand Details (indicate cue type and reason) with RW; cueing for increased anterior weight shift to prevent posterior LOB   Stand Pivot Transfers 4: Min assist   Stand Pivot Transfer Details (indicate cue type and reason) with RW; cueing for safety     Ambulation/Gait   Ambulation/Gait Yes   Ambulation/Gait Assistance 3: Mod assist;4: Min assist     RLE Tone   RLE Tone Modified [place]  >15 beats of clonus in B soleus muscles     RLE Tone   Hypertonic Details quadriceps   Modified Ashworth Scale for Grading Hypertonia RLE Slight increase in muscle tone, manifested by a catch, followed by minimal resistance throughout the remainder (less than half) of the ROM     LLE Tone   LLE Tone Modified [place]     LLE Tone   Hypertonic Details quadriceps   Modified [place]worth Scale for Grading Hypertonia LLE Considerable increase in muschle tone, passive movement difficult                   [facility] Adult PT Treatment/Exercise - 06/16/16 0001      Ambulation/Gait   Ambulation/Gait Assistance Details Gait x35' over level, indoor surfaces with RW and mod A during initial 10'. Cueing focused on safe proximity to RW.   Ambulation Distance (Feet) 35 Feet   Assistive device Rolling walker    Gait Pattern Step-to pattern;Step-through pattern;Decreased stride length;Decreased hip/knee flexion - right;Decreased hip/knee flexion - left;Decreased dorsiflexion - right;Decreased dorsiflexion - left;Right foot flat;Left foot flat;Ataxic;Trunk flexed;Shuffle   Ambulation Surface Level;Indoor                  PT Short Term Goals - 06/16/16 1853      PT SHORT TERM GOAL #1   Title STG's = LTG's  PT Long Term Goals - 06/16/16 1853      PT LONG TERM GOAL #1   Title Pt will effectively perform HEP with assist of wife to maximize functional gains made in PT.  (Target date for all LTG's: 07/14/16)   Status New     PT LONG TERM GOAL #2   Title Pt will perform stand pivot transfer from w/c <> mat table with mod I using LRAD to indicate increased safety with functional transfers..    Status New     PT LONG TERM GOAL #3   Title Pt will consistently perform sit <> stand from standard chair with mod I using LRAD to indicate improved functional LE strength.   Status New     PT LONG TERM GOAL #4   Title Pt will perform dynamic standing >/= 3 consecutive minutes with single UE support at countertop with supervision, no overt LOB to indicate increase safety with ADL's.    Status New     PT LONG TERM GOAL #5   Title Pt will ambulate x100' with LRAD and close supervision of wife to indicate increased safety with household mobility.   Status New               Plan - 06/16/16 1857    Clinical Impression Statement Pt is a 65 y/o M referred to outpatient PT to address functional impairments associated with CVA sustained in 2011. PT evaluation reveals the following: decreased stability/independence with all aspects of functional mobility, impaired standing balance, abnormal tone in BLE's, impaired coordination, impaired flexibility, L hemiparesis, and high fall risk. Pt will benefit from skilled outpatient PT 2x/week for 4 weeks to address said impairments.   Rehab  Potential Fair   Clinical Impairments Affecting Rehab Potential Chronic nature of deficits; good family/social support   PT Frequency 2x / week   PT Duration 4 weeks   PT Treatment/Interventions ADLs/Self Care Home Management;Balance training;Neuromuscular re-education;Patient/family education;Gait training;Functional mobility training;Therapeutic activities;Therapeutic exercise   PT Next Visit Plan Initiate HEP to address LE stretching/spasticity management, functional LE strengthening.   Consulted and Agree with Plan of Care Patient;Family member/caregiver   Family Member Consulted Wife, Marland Mcalpine      Patient will benefit from skilled therapeutic intervention in order to improve the following deficits and impairments:  Decreased balance, Abnormal gait, Decreased activity tolerance, Decreased coordination, Decreased safety awareness, Decreased strength, Impaired flexibility, Postural dysfunction, Impaired tone, Decreased mobility  Visit Diagnosis: Other abnormalities of gait and mobility - Plan: PT plan of care cert/re-cert  Other lack of coordination - Plan: PT plan of care cert/re-cert  Unsteadiness on feet - Plan: PT plan of care cert/re-cert  Other symptoms and signs involving the nervous system - Plan: PT plan of care cert/re-cert      G-Codes - 16/10/96 1849    Functional Assessment Tool Used Gait x35' with up to mod A   Functional Limitation Mobility: Walking and moving around   Mobility: Walking and Moving Around Current Status [phone]) At least 60 percent but less than 80 percent impaired, limited or restricted   Mobility: Walking and Moving Around Goal Status [phone]) At least 40 percent but less than 60 percent impaired, limited or restricted       Problem List Patient Active Problem List   Diagnosis Date Noted  . CKD (chronic kidney disease) stage 3, GFR 30-59 ml/min 08/16/2015  . At high risk for falls 08/16/2015  . Subcutaneous nodules 08/16/2015  . Prediabetes  10/07/2013  . Inguinal  hernia 03/25/2013  . History of CVA with residual deficit 03/25/2013  . HTN (hypertension) 10/09/2012  . Tobacco abuse 10/09/2012   Billie Ruddy, PT, DPT [facility] [address] [place], [name], [address] Phone: [phone]   Fax:  [phone] 06/16/16, 7:02 PM  Name: Darrell [name] MRN: [id number] Date of Birth: 08-08-1951

## 2016-06-30 ENCOUNTER — Ambulatory Visit: Payer: Medicare Other | Attending: Family Medicine | Admitting: Physical Therapy

## 2016-06-30 ENCOUNTER — Encounter: Payer: Self-pay | Admitting: Physical Therapy

## 2016-06-30 DIAGNOSIS — R278 Other lack of coordination: Secondary | ICD-10-CM | POA: Diagnosis present

## 2016-06-30 DIAGNOSIS — R29818 Other symptoms and signs involving the nervous system: Secondary | ICD-10-CM

## 2016-06-30 DIAGNOSIS — R2689 Other abnormalities of gait and mobility: Secondary | ICD-10-CM | POA: Diagnosis not present

## 2016-06-30 DIAGNOSIS — R269 Unspecified abnormalities of gait and mobility: Secondary | ICD-10-CM | POA: Diagnosis present

## 2016-06-30 DIAGNOSIS — R270 Ataxia, unspecified: Secondary | ICD-10-CM | POA: Insufficient documentation

## 2016-06-30 DIAGNOSIS — R2681 Unsteadiness on feet: Secondary | ICD-10-CM | POA: Diagnosis present

## 2016-06-30 NOTE — Patient Instructions (Addendum)
Dorsiflexion: Stretch - Heel Cord / Gastrocnemius    Position (A) Patient: Lie  knee straight. Helper: Cup left heel. Make sure grip is firm.   Motion (B) - Helper uses forearm to apply pressure to entire sole of foot, pushing toes toward head.   CAUTION: Stretch should be felt in calf. Do not allow foot to twist.  Hold _30__ seconds. Repeat _3__ times. Repeat with other leg. Do _1-2__ sessions per day.   Copyright  VHI. All rights reserved.    Flexion: Stretch - Hamstrings (Supine) Variations for 4a    Lying on bed with legs out straight: Perform with foot pointed up at ceiling. Helper may kneel on bed, using shoulder to support patient's leg.   Slowly lift leg up with hands resting over knee to keep leg straight. Use your legs to lift you and Darrell [name]'s legs up, not your back.   Hold for 30 seconds. Perform 3 reps each leg.  Copyright  VHI. All rights reserved.       BRIDGING WITH PILLOW SQUEEZE  While lying on your back, place a pillow between your knees and squeeze the pillow.  Keep squeezing the pillow while you lift your butt up off the bed. Hold for 5 seconds. Repeat for 10 reps, 1-2 times a day. Darrell [name]- you will need to support his knees so the do not tip over and provide cues to keep Darrell [name] on task.      While seated on your bed, with a locked walker or chair in front of you for safety with balance. Scoot forward towards the edge of the bed, using both arms stand up fully. Then use both arms to sit down slowly, bending your knees. Repeat for 10 reps, 1-2 times a day. Only do this when Darrell [name] is with you.

## 2016-06-30 NOTE — Therapy (Signed)
[facility] [address] [place], [name], [address] Phone: [phone]   Fax:  [phone]  Physical Therapy Treatment  Patient Details  Name: Darrell [name] MRN: [id number] Date of Birth: 06-16-1951 Referring Provider: Boykin Nearing, MD  Encounter Date: 06/30/2016      PT End of Session - 06/30/16 1326    Visit Number 2   Number of Visits 9  eval + 8 visits   Date for PT Re-Evaluation 07/16/16   Authorization Type G code every 10th visit with progress note   PT Start Time 1318   PT Stop Time 1400   PT Time Calculation (min) 42 min   Equipment Utilized During Treatment Gait belt   Activity Tolerance Patient tolerated treatment well   Behavior During Therapy [facility] for tasks assessed/performed      Past Medical History:  Diagnosis Date  . Chronic kidney disease    stage 3 GFR 30-59 ml/min   . Dysphagia as late effect of cerebrovascular disease    pts wife states pt has to eat soft foods   . GERD (gastroesophageal reflux disease)   . Glaucoma   . High cholesterol   . Hypertension   . Neuromuscular disorder ([place])    chronic inflammatory demyelinating polyneuropathy   . Stroke [facility])    2011 with residual deficit left sided weakness  . Tobacco dependence     Past Surgical History:  Procedure Laterality Date  . EYE SURGERY    . INGUINAL HERNIA REPAIR Right 11/23/2014   Procedure: right inguinal hernia repair with mesh;  Surgeon: Armandina Gemma, MD;  Location: [facility];  Service: General;  Laterality: Right;  . INSERTION OF MESH N/A 11/23/2014   Procedure: INSERTION OF MESH;  Surgeon: Armandina Gemma, MD;  Location: [facility];  Service: General;  Laterality: N/A;  . SHOULDER SURGERY Bilateral 1988, 1998    There were no vitals filed for this visit.      Subjective Assessment - 06/30/16 1324    Subjective No new complaints. No falls or pain to report. Spouse reports that he has had an arthritis flairup going on in  left hand for ~6 months. Using a cream that his medical MD gave him.   Patient is accompained by: Family member  spouse, [name]   Pertinent History h/o CVA in 2011 with L-sided weakness, CIDP 2012   Patient Stated Goals Improve walking-to walk without needing help (devices or people); and ot ride a bicycle   Currently in Pain? No/denies             [facility] Adult PT Treatment/Exercise - 06/30/16 1414      Transfers   Transfers Sit to Stand;Stand to [name] Transfers   Sit to Stand 4: Min guard;With upper extremity assist;From elevated surface;From bed;From chair/3-in-1;Multiple attempts   Sit to Stand Details Verbal cues for safe use of DME/AE;Verbal cues for technique;Verbal cues for sequencing;Verbal cues for precautions/safety;Visual cues/gestures for precautions/safety   Sit to Stand Details (indicate cue type and reason) cues to scoot to edge, for UE placement, for anterior weight shifting    Stand to Sit 4: Min guard;With upper extremity assist;To elevated surface;To bed;To chair/3-in-1;With armrests;4: Min assist   Stand to Sit Details (indicate cue type and reason) Verbal cues for sequencing;Verbal cues for technique;Verbal cues for precautions/safety;Verbal cues for safe use of DME/AE   Stand to Sit Details cues to reach back, for hip/knee flexion with sitting down and to use arms/legs to slow descent  so not to "plop" on surface   Stand Pivot Transfers 4: Min guard;Other (comment)   Stand Pivot Transfer Details (indicate cue type and reason) with bil UE support on PTA shoulders, verbal/visual/tactile cues for upright posture, to advance bil feet toward target and for weight shifitng to allow feet to advance. performed 1 rep wheelchair<>mat table.     Issued the following to HEP: Dorsiflexion: Stretch - Heel Cord / Gastrocnemius    Position (A) Patient: Lie  knee straight. Helper: Cup left heel. Make sure grip is firm.   Motion (B) - Helper uses forearm to apply  pressure to entire sole of foot, pushing toes toward head.   CAUTION: Stretch should be felt in calf. Do not allow foot to twist.  Hold _30__ seconds. Repeat _3__ times. Repeat with other leg. Do _1-2__ sessions per day.   Copyright  VHI. All rights reserved.    Flexion: Stretch - Hamstrings (Supine) Variations for 4a    Lying on bed with legs out straight: Perform with foot pointed up at ceiling. Helper may kneel on bed, using shoulder to support patient's leg.   Slowly lift leg up with hands resting over knee to keep leg straight. Use your legs to lift you and [place]'s legs up, not your back.   Hold for 30 seconds. Perform 3 reps each leg.  Copyright  VHI. All rights reserved.       BRIDGING WITH PILLOW SQUEEZE  While lying on your back, place a pillow between your knees and squeeze the pillow.  Keep squeezing the pillow while you lift your butt up off the bed. Hold for 5 seconds. Repeat for 10 reps, 1-2 times a day. [name]- you will need to support his knees so the do not tip over and provide cues to keep [place] on task.      While seated on your bed, with a locked walker or chair in front of you for safety with balance. Scoot forward towards the edge of the bed, using both arms stand up fully. Then use both arms to sit down slowly, bending your knees. Repeat for 10 reps, 1-2 times a day. Only do this when [name] is with you.         PT Education - 06/30/16 1413    Education provided Yes   Education Details HEP: for LE stretching and strengthening   Person(s) Educated Patient;Spouse   Methods Explanation;Demonstration;Verbal cues;Handout;Tactile cues   Comprehension Verbalized understanding;Returned demonstration;Tactile cues required;Verbal cues required;Need further instruction          PT Short Term Goals - 06/16/16 1853      PT SHORT TERM GOAL #1   Title STG's = LTG's           PT Long Term Goals - 06/16/16 1853      PT LONG TERM GOAL #1     Title Pt will effectively perform HEP with assist of wife to maximize functional gains made in PT.  (Target date for all LTG's: 07/14/16)   Status New     PT LONG TERM GOAL #2   Title Pt will perform stand pivot transfer from w/c <> mat table with mod I using LRAD to indicate increased safety with functional transfers..    Status New     PT LONG TERM GOAL #3   Title Pt will consistently perform sit <> stand from standard chair with mod I using LRAD to indicate improved functional LE strength.   Status New  PT LONG TERM GOAL #4   Title Pt will perform dynamic standing >/= 3 consecutive minutes with single UE support at countertop with supervision, no overt LOB to indicate increase safety with ADL's.    Status New     PT LONG TERM GOAL #5   Title Pt will ambulate x100' with LRAD and close supervision of wife to indicate increased safety with household mobility.   Status New            Plan - 06/30/16 1326    Clinical Impression Statement today's session focused on re-establishment of HEP for LE stretching and strengthening. Spouse educated on how to assist/cue pt at home. Pt needing moderate cues today for safety due to impulsivity (attempting to get up several times during session without assistance). Pt should benefit form continued PT to progress toward unmet goals.                           Rehab Potential Fair   Clinical Impairments Affecting Rehab Potential Chronic nature of deficits; good family/social support   PT Frequency 2x / week   PT Duration 4 weeks   PT Treatment/Interventions ADLs/Self Care Home Management;Balance training;Neuromuscular re-education;Patient/family education;Gait training;Functional mobility training;Therapeutic activities;Therapeutic exercise   PT Next Visit Plan functional LE strengthening, transfer training for safety at home, continue with stretching   Consulted and Agree with Plan of Care Patient;Family member/caregiver   Family Member  Consulted Wife, Marland Mcalpine      Patient will benefit from skilled therapeutic intervention in order to improve the following deficits and impairments:  Decreased balance, Abnormal gait, Decreased activity tolerance, Decreased coordination, Decreased safety awareness, Decreased strength, Impaired flexibility, Postural dysfunction, Impaired tone, Decreased mobility  Visit Diagnosis: Other abnormalities of gait and mobility  Other lack of coordination  Unsteadiness on feet  Other symptoms and signs involving the nervous system     Problem List Patient Active Problem List   Diagnosis Date Noted  . CKD (chronic kidney disease) stage 3, GFR 30-59 ml/min 08/16/2015  . At high risk for falls 08/16/2015  . Subcutaneous nodules 08/16/2015  . Prediabetes 10/07/2013  . Inguinal hernia 03/25/2013  . History of CVA with residual deficit 03/25/2013  . HTN (hypertension) 10/09/2012  . Tobacco abuse 10/09/2012    Willow Ora, PTA, [place] [address], [place], [place] [address] [phone] 06/30/16, 2:29 PM   Name: EDWORD CU MRN: [id number] Date of Birth: 1951-06-26

## 2016-07-04 ENCOUNTER — Ambulatory Visit: Payer: Medicare Other | Admitting: Physical Therapy

## 2016-07-04 DIAGNOSIS — R278 Other lack of coordination: Secondary | ICD-10-CM

## 2016-07-04 DIAGNOSIS — R2689 Other abnormalities of gait and mobility: Secondary | ICD-10-CM | POA: Diagnosis not present

## 2016-07-04 DIAGNOSIS — R2681 Unsteadiness on feet: Secondary | ICD-10-CM

## 2016-07-04 NOTE — Therapy (Signed)
[facility] [address] [place], [name], [address] Phone: [phone]   Fax:  [phone]  Physical Therapy Treatment  Patient Details  Name: Darrell [name] MRN: [id number] Date of Birth: [birth year] Referring Provider: Boykin Nearing, MD  Encounter Date: 07/04/2016      PT End of Session - 07/04/16 1301    Visit Number 3   Number of Visits 9   Date for PT Re-Evaluation 07/16/16   Authorization Type G code every 10th visit with progress note   PT Start Time 1153   PT Stop Time 1239   PT Time Calculation (min) 46 min   Equipment Utilized During Treatment Gait belt   Activity Tolerance Patient tolerated treatment well;Patient limited by fatigue  ambulation tolerance limited by fatigue   Behavior During Therapy [facility] for tasks assessed/performed      Past Medical History:  Diagnosis Date  . Chronic kidney disease    stage 3 GFR 30-59 ml/min   . Dysphagia as late effect of cerebrovascular disease    pts wife states pt has to eat soft foods   . GERD (gastroesophageal reflux disease)   . Glaucoma   . High cholesterol   . Hypertension   . Neuromuscular disorder ([place])    chronic inflammatory demyelinating polyneuropathy   . Stroke [facility])    2011 with residual deficit left sided weakness  . Tobacco dependence     Past Surgical History:  Procedure Laterality Date  . EYE SURGERY    . INGUINAL HERNIA REPAIR Right 11/23/2014   Procedure: right inguinal hernia repair with mesh;  Surgeon: Armandina Gemma, MD;  Location: [facility];  Service: General;  Laterality: Right;  . INSERTION OF MESH N/A 11/23/2014   Procedure: INSERTION OF MESH;  Surgeon: Armandina Gemma, MD;  Location: [facility];  Service: General;  Laterality: N/A;  . SHOULDER SURGERY Bilateral 1988, 1998    There were no vitals filed for this visit.      Subjective Assessment - 07/04/16 1157    Subjective Pt reports no issues or falls since last visit.   Patient is  accompained by: Family member  spouse, [name]   Pertinent History h/o CVA in 2011 with L-sided weakness, CIDP 2012   Patient Stated Goals Improve walking-to walk without needing help (devices or people); and ot ride a bicycle   Currently in Pain? No/denies                         [facility] Adult PT Treatment/Exercise - 07/04/16 0001      Transfers   Transfers Sit to Stand;Stand to Sit   Sit to Stand 4: Min assist;4: Min guard   Sit to Stand Details (indicate cue type and reason) Cueing for setup (scooting to EOM, foot placement, hand placement), for full anterior weight shift, and tactile cueing for concurrent hip/knee extension (as pt tends to extend B knees, then hips when standing).   Stand to Sit 4: Min guard;4: Min assist   Stand to Sit Details Cueing for full anterior weight shift to control descent with effective within-session carryover.   Comments Blocked practice of sit <> stand from EOM emphasis on setup (see above) full anterior weight shift (during sit > stand and to control descent during stand > sit).     Ambulation/Gait   Ambulation/Gait Yes   Ambulation/Gait Assistance 4: Min guard;4: Min assist   Ambulation/Gait Assistance Details Gait x45' then x40' (seated rest break  between trials due to pt fatigue) with bari RW (for both taller height and wider BOS) weighted with 6# cuff weights x2 to increase pt control of RW advancement. Cueing emphasized increased B step height (inc hip/knee flexion during respective LE advancement) and B heel strike with effective return demo during 50% of gait    Ambulation Distance (Feet) 85 Feet  total   Assistive device Rolling walker;Other (Comment)  bari RW weighted with 6# weights x2   Gait Pattern Step-to pattern;Step-through pattern;Decreased stride length;Decreased hip/knee flexion - right;Decreased hip/knee flexion - left;Decreased dorsiflexion - right;Decreased dorsiflexion - left;Right foot flat;Left foot flat;Ataxic;Trunk  flexed;Shuffle;Wide base of support;Trunk rotated posteriorly on right     Neuro Re-ed    Neuro Re-ed Details  Multiple trials of static standing x30-45 seconds with min guard to mod A to prevent posterior LOB. Multimodal cueing emphasized lateral trunk lean to R.                PT Education - 07/04/16 1249    Education provided Yes   Education Details Importance of forward weight shift for sit <> stand. Discussed use of weights to increase pt control of RW advancement.   Person(s) Educated Patient;Spouse   Methods Explanation;Demonstration   Comprehension Verbalized understanding          PT Short Term Goals - 06/16/16 1853      PT SHORT TERM GOAL #1   Title STG's = LTG's           PT Long Term Goals - 06/16/16 1853      PT LONG TERM GOAL #1   Title Pt will effectively perform HEP with assist of wife to maximize functional gains made in PT.  (Target date for all LTG's: 07/14/16)   Status New     PT LONG TERM GOAL #2   Title Pt will perform stand pivot transfer from w/c <> mat table with mod I using LRAD to indicate increased safety with functional transfers..    Status New     PT LONG TERM GOAL #3   Title Pt will consistently perform sit <> stand from standard chair with mod I using LRAD to indicate improved functional LE strength.   Status New     PT LONG TERM GOAL #4   Title Pt will perform dynamic standing >/= 3 consecutive minutes with single UE support at countertop with supervision, no overt LOB to indicate increase safety with ADL's.    Status New     PT LONG TERM GOAL #5   Title Pt will ambulate x100' with LRAD and close supervision of wife to indicate increased safety with household mobility.   Status New               Plan - 07/04/16 1303    Clinical Impression Statement Session focused on transfers and gait training. Pt did appear to benefit from use of weights (12# total) on RW to increase pt control of RW advancement due to ataxia. Pt  ambulated x45' with weighted RW requiring min guard-min A during this session.    Rehab Potential Fair   Clinical Impairments Affecting Rehab Potential Chronic nature of deficits; good family/social support   PT Frequency 2x / week   PT Duration 4 weeks   PT Treatment/Interventions ADLs/Self Care Home Management;Balance training;Neuromuscular re-education;Patient/family education;Gait training;Functional mobility training;Therapeutic activities;Therapeutic exercise   PT Next Visit Plan Transfer/gait training, postural control in standing, grading/control of movement   Consulted and Agree with Plan of  Care Patient;Family member/caregiver   Family Member Consulted Wife, Marland Mcalpine      Patient will benefit from skilled therapeutic intervention in order to improve the following deficits and impairments:  Decreased balance, Abnormal gait, Decreased activity tolerance, Decreased coordination, Decreased safety awareness, Decreased strength, Impaired flexibility, Postural dysfunction, Impaired tone, Decreased mobility  Visit Diagnosis: Other abnormalities of gait and mobility  Other lack of coordination  Unsteadiness on feet     Problem List Patient Active Problem List   Diagnosis Date Noted  . CKD (chronic kidney disease) stage 3, GFR 30-59 ml/min 08/16/2015  . At high risk for falls 08/16/2015  . Subcutaneous nodules 08/16/2015  . Prediabetes 10/07/2013  . Inguinal hernia 03/25/2013  . History of CVA with residual deficit 03/25/2013  . HTN (hypertension) 10/09/2012  . Tobacco abuse 10/09/2012   Billie Ruddy, PT, DPT [facility] [address] [place], [name], [address] Phone: [phone]   Fax:  [phone] 07/04/16, 1:21 PM  Name: Darrell [name] MRN: [id number] Date of Birth: Jul 20, 1951

## 2016-07-05 ENCOUNTER — Ambulatory Visit: Payer: Medicare Other | Admitting: Physical Therapy

## 2016-07-06 MED FILL — ?AMLODIPINE BESYLATE 10 MG: 10 | 30 days supply | Qty: 30 | Fill #1

## 2016-07-06 MED FILL — ATORVASTATIN 40 MG TABLET: 40 | 30 days supply | Qty: 30 | Fill #1

## 2016-07-06 MED FILL — hydrALAZINE HCL 25 MG TABS: 25 | 30 days supply | Qty: 90 | Fill #1

## 2016-07-06 MED FILL — ?LISINOPRIL 40 MG TABLET: 40 MG | 30 days supply | Qty: 30 | Fill #1

## 2016-07-07 ENCOUNTER — Ambulatory Visit: Payer: Medicare Other | Admitting: Physical Therapy

## 2016-07-07 ENCOUNTER — Encounter: Payer: Self-pay | Admitting: Physical Therapy

## 2016-07-07 DIAGNOSIS — R29818 Other symptoms and signs involving the nervous system: Secondary | ICD-10-CM

## 2016-07-07 DIAGNOSIS — R278 Other lack of coordination: Secondary | ICD-10-CM

## 2016-07-07 DIAGNOSIS — R2689 Other abnormalities of gait and mobility: Secondary | ICD-10-CM | POA: Diagnosis not present

## 2016-07-07 DIAGNOSIS — R2681 Unsteadiness on feet: Secondary | ICD-10-CM

## 2016-07-07 NOTE — Therapy (Signed)
[facility] [address] [place], [name], [address] Phone: [phone]   Fax:  [phone]  Physical Therapy Treatment  Patient Details  Name: Darrell [name] MRN: [id number] Date of Birth: Jul 28, 1951 Referring Provider: Boykin Nearing, MD  Encounter Date: 07/07/2016      PT End of Session - 07/07/16 1418    Visit Number 4   Number of Visits 9   Date for PT Re-Evaluation 07/16/16   Authorization Type G code every 10th visit with progress note   PT Start Time 1104   PT Stop Time 1147   PT Time Calculation (min) 43 min   Equipment Utilized During Treatment Gait belt   Activity Tolerance Patient tolerated treatment well;Patient limited by fatigue  ambulation tolerance limited by fatigue   Behavior During Therapy [facility] for tasks assessed/performed      Past Medical History:  Diagnosis Date  . Chronic kidney disease    stage 3 GFR 30-59 ml/min   . Dysphagia as late effect of cerebrovascular disease    pts wife states pt has to eat soft foods   . GERD (gastroesophageal reflux disease)   . Glaucoma   . High cholesterol   . Hypertension   . Neuromuscular disorder ([place])    chronic inflammatory demyelinating polyneuropathy   . Stroke [facility])    2011 with residual deficit left sided weakness  . Tobacco dependence     Past Surgical History:  Procedure Laterality Date  . EYE SURGERY    . INGUINAL HERNIA REPAIR Right 11/23/2014   Procedure: right inguinal hernia repair with mesh;  Surgeon: Armandina Gemma, MD;  Location: [facility];  Service: General;  Laterality: Right;  . INSERTION OF MESH N/A 11/23/2014   Procedure: INSERTION OF MESH;  Surgeon: Armandina Gemma, MD;  Location: [facility];  Service: General;  Laterality: N/A;  . SHOULDER SURGERY Bilateral 1988, 1998    There were no vitals filed for this visit.      Subjective Assessment - 07/07/16 1150    Subjective Per wife, "He (patient) might be a little shaken up today. We  had a disturbing morning."  Wife forgot to bring in rolling walker today.   Patient is accompained by: Family member  spouse, [name]   Pertinent History h/o CVA in 2011 with L-sided weakness, CIDP 2012   Patient Stated Goals Improve walking-to walk without needing help (devices or people); and ot ride a bicycle   Currently in Pain? No/denies                         [facility] Adult PT Treatment/Exercise - 07/07/16 0001      Transfers   Transfers Sit to Stand;Stand to Sit   Sit to Stand 4: Min assist   Sit to Stand Details (indicate cue type and reason) Cueing for setup, foot position, hand placement, and full anterior weight shift during sit > stand.   Stand to Sit 4: Min guard;4: Min assist   Stand to Sit Details Cueing for safe proximity to surface prior to sitting, for full anterior weight shift, and slow, controlled descent   Stand Pivot Transfers 4: Min assist   Stand Pivot Transfer Details (indicate cue type and reason) with weight RW (10# total) with min A, max multimodal cueing for foot placement, safety   Comments Blocked practice of sit <> stand from elevated mat table with multimodal cueing for setup, foot placement, hand placement, and full anterior weight  shift (and to promote B hip extension, anterior weight shift upon achieving standing position due to posterior preference).      Ambulation/Gait   Ambulation/Gait Yes   Ambulation/Gait Assistance 4: Min assist   Ambulation/Gait Assistance Details Gait x60' then x58' (seated rest breaks between trials, per pt request, due to fatigue) with min guard-min A, cueing for increased B hip/knee flexion during respective LE advancement, for increased step length, for upright posture, and for safe (closer) proximity to RW (as pt tends to maintain BUE's in full extension). Required increased assist during turning and obstacle negotiation. With increased fatigue, note increased clonus in L ankle plantarflexors.   Ambulation  Distance (Feet) 60 Feet  58   Assistive device Rolling walker;Other (Comment)  RW weighted with 5# weights x2   Gait Pattern Step-to pattern;Step-through pattern;Decreased stride length;Decreased hip/knee flexion - right;Decreased hip/knee flexion - left;Decreased dorsiflexion - right;Decreased dorsiflexion - left;Right foot flat;Left foot flat;Ataxic;Trunk flexed;Shuffle;Wide base of support;Trunk rotated posteriorly on right   Ambulation Surface Level;Indoor                  PT Short Term Goals - 06/16/16 1853      PT SHORT TERM GOAL #1   Title STG's = LTG's           PT Long Term Goals - 06/16/16 1853      PT LONG TERM GOAL #1   Title Pt will effectively perform HEP with assist of wife to maximize functional gains made in PT.  (Target date for all LTG's: 07/14/16)   Status New     PT LONG TERM GOAL #2   Title Pt will perform stand pivot transfer from w/c <> mat table with mod I using LRAD to indicate increased safety with functional transfers..    Status New     PT LONG TERM GOAL #3   Title Pt will consistently perform sit <> stand from standard chair with mod I using LRAD to indicate improved functional LE strength.   Status New     PT LONG TERM GOAL #4   Title Pt will perform dynamic standing >/= 3 consecutive minutes with single UE support at countertop with supervision, no overt LOB to indicate increase safety with ADL's.    Status New     PT LONG TERM GOAL #5   Title Pt will ambulate x100' with LRAD and close supervision of wife to indicate increased safety with household mobility.   Status New               Plan - 07/07/16 1419    Clinical Impression Statement Session continued to focus on gait and transfer training. Gait stability continues to be limited by ataxia, wide BOS, shortened step length, and clonus in B ankle plantarflexors (on L > R).    Rehab Potential Fair   Clinical Impairments Affecting Rehab Potential Chronic nature of deficits;  good family/social support   PT Frequency 2x / week   PT Duration 4 weeks   PT Treatment/Interventions ADLs/Self Care Home Management;Balance training;Neuromuscular re-education;Patient/family education;Gait training;Functional mobility training;Therapeutic activities;Therapeutic exercise   PT Next Visit Plan Wife planning to bring personal RW to session. Talk more with pt/wife about adding weights to personal RW. Focus on sit <> stands. As soon as wife safe to assist pt during ambulation, initiate home walking program.   Consulted and Agree with Plan of Care Patient;Family member/caregiver   Family Member Consulted Wife, Marland Mcalpine      Patient will benefit  from skilled therapeutic intervention in order to improve the following deficits and impairments:  Decreased balance, Abnormal gait, Decreased activity tolerance, Decreased coordination, Decreased safety awareness, Decreased strength, Impaired flexibility, Postural dysfunction, Impaired tone, Decreased mobility  Visit Diagnosis: Other abnormalities of gait and mobility  Other lack of coordination  Unsteadiness on feet  Other symptoms and signs involving the nervous system     Problem List Patient Active Problem List   Diagnosis Date Noted  . CKD (chronic kidney disease) stage 3, GFR 30-59 ml/min 08/16/2015  . At high risk for falls 08/16/2015  . Subcutaneous nodules 08/16/2015  . Prediabetes 10/07/2013  . Inguinal hernia 03/25/2013  . History of CVA with residual deficit 03/25/2013  . HTN (hypertension) 10/09/2012  . Tobacco abuse 10/09/2012    Billie Ruddy, PT, DPT [facility] [address] [place], [name], [address] Phone: [phone]   Fax:  [phone] 07/07/16, 2:27 PM  Name: KENY DONALD MRN: [id number] Date of Birth: January 18, 1951

## 2016-07-11 ENCOUNTER — Ambulatory Visit: Payer: Medicare Other | Admitting: Physical Therapy

## 2016-07-14 ENCOUNTER — Ambulatory Visit: Payer: Medicare Other | Admitting: Physical Therapy

## 2016-07-14 ENCOUNTER — Telehealth: Payer: Self-pay | Admitting: Physical Therapy

## 2016-07-14 DIAGNOSIS — I6930 Unspecified sequelae of cerebral infarction: Secondary | ICD-10-CM

## 2016-07-14 DIAGNOSIS — Z9181 History of falling: Secondary | ICD-10-CM

## 2016-07-14 DIAGNOSIS — R2681 Unsteadiness on feet: Secondary | ICD-10-CM

## 2016-07-14 DIAGNOSIS — R278 Other lack of coordination: Secondary | ICD-10-CM

## 2016-07-14 DIAGNOSIS — R2689 Other abnormalities of gait and mobility: Secondary | ICD-10-CM | POA: Diagnosis not present

## 2016-07-14 NOTE — Therapy (Signed)
[facility] [address] [place], [name], [address] Phone: [phone]   Fax:  [phone]  Physical Therapy Treatment  Patient Details  Name: Darrell [name] MRN: [id number] Date of Birth: 30-Sep-1950 Referring Provider: Boykin Nearing, MD  Encounter Date: 07/14/2016      PT End of Session - 07/14/16 1723    Visit Number 5   Number of Visits 11   Date for PT Re-Evaluation 08/13/16   Authorization Type G code every 10th visit with progress note   PT Start Time 1406   PT Stop Time 1450   PT Time Calculation (min) 44 min   Equipment Utilized During Treatment Gait belt   Activity Tolerance Patient tolerated treatment well;Patient limited by fatigue  ambulation tolerance limited by fatigue   Behavior During Therapy [facility] for tasks assessed/performed      Past Medical History:  Diagnosis Date  . Chronic kidney disease    stage 3 GFR 30-59 ml/min   . Dysphagia as late effect of cerebrovascular disease    pts wife states pt has to eat soft foods   . GERD (gastroesophageal reflux disease)   . Glaucoma   . High cholesterol   . Hypertension   . Neuromuscular disorder ([place])    chronic inflammatory demyelinating polyneuropathy   . Stroke [facility])    2011 with residual deficit left sided weakness  . Tobacco dependence     Past Surgical History:  Procedure Laterality Date  . EYE SURGERY    . INGUINAL HERNIA REPAIR Right 11/23/2014   Procedure: right inguinal hernia repair with mesh;  Surgeon: Armandina Gemma, MD;  Location: [facility];  Service: General;  Laterality: Right;  . INSERTION OF MESH N/A 11/23/2014   Procedure: INSERTION OF MESH;  Surgeon: Armandina Gemma, MD;  Location: [facility];  Service: General;  Laterality: N/A;  . SHOULDER SURGERY Bilateral 1988, 1998    There were no vitals filed for this visit.      Subjective Assessment - 07/14/16 1412    Subjective Pt reports no issues or falls since last visit and states  that he has not walked that much at home ("very little") because his wife does not want him performing sit<>stands by himself.   Patient is accompained by: Family member  spouse, [name]   Pertinent History h/o CVA in 2011 with L-sided weakness, CIDP 2012   Patient Stated Goals Improve walking-to walk without needing help (devices or people); and ot ride a bicycle   Currently in Pain? No/denies   Multiple Pain Sites No            [facility] PT Assessment - 07/14/16 1400      Assessment   Medical Diagnosis History of CVA with residual deficit   Referring Provider Boykin Nearing, MD   Onset Date/Surgical Date 05/26/16                     [facility] Adult PT Treatment/Exercise - 07/14/16 1400      Transfers   Transfers Sit to Stand;Stand to [name] Transfers   Sit to Stand 4: Min assist;With upper extremity assist;With armrests;From chair/3-in-1   Sit to Stand Details (indicate cue type and reason) Verbal cuing to incr trunk flex to facilitate improved weight shift to stand.     Stand to Sit 4: Min guard;With upper extremity assist;To chair/3-in-1   Stand Pivot Transfers 4: Min guard   Stand Pivot Transfer Details (indicate cue type and reason) Verbal  cuing for foot placement and to fully turn around before sitting.     Ambulation/Gait   Ambulation/Gait Yes   Ambulation/Gait Assistance 4: Min guard   Ambulation/Gait Assistance Details Pt ambulated with standard RW and bariatric RW to determine difference in gait quality and posture.  Pt also ambulated with wife providing min guard to incr safety when ambulating at home.  Pt and wife instructed to avoid gait at this point but will continue practicing in future sessions.   Ambulation Distance (Feet) 120 Feet  1x120', 1x50'   Assistive device Rolling walker;Other (Comment)  bariatric RW   Gait Pattern Step-through pattern;Decreased stride length;Decreased hip/knee flexion - right;Decreased hip/knee flexion -  left;Decreased dorsiflexion - right;Decreased dorsiflexion - left;Shuffle;Ataxic;Trunk flexed;Narrow base of support;Poor foot clearance - left;Poor foot clearance - right   Ambulation Surface Level;Indoor   Gait Comments Pt gait quality and posture improved with bariatric RW.  PT will request order for bariatric RW from doctor.                PT Education - 07/14/16 1722    Education provided Yes   Education Details Pt and wife educated on gait at home with wife providing min guard.  Pt and wife advised to avoid gait at home at this point but will continue practice in future sessions.   Person(s) Educated Patient;Spouse   Methods Explanation;Demonstration;Verbal cues   Comprehension Verbalized understanding;Returned demonstration          PT Short Term Goals - 06/16/16 1853      PT SHORT TERM GOAL #1   Title STG's = LTG's           PT Long Term Goals - 07/14/16 1742      PT LONG TERM GOAL #1   Title Pt will effectively perform HEP with assist of wife to maximize functional gains made in PT.  (Target date for all LTG's: 08/11/16)   Baseline --  Continue goal through renewed POC.   Status On-going     PT LONG TERM GOAL #2   Title Pt will perform stand pivot transfer from w/c <> mat table with supervision using LRAD to indicate increased safety with functional transfers..    Baseline 10/20: REVISED due to lack of progress.  Continue goal through renewed POC.   Status Revised     PT LONG TERM GOAL #3   Title Pt will consistently perform sit <> stand from standard chair with supervision using LRAD to indicate improved functional LE strength.   Baseline 10/20: REVISED to supervision due to lack of progress.  Continue goal through renewed POC.   Status Revised     PT LONG TERM GOAL #4   Title Pt will perform dynamic standing >/= 3 consecutive minutes with single UE support at countertop with supervision, no overt LOB to indicate increase safety with ADL's.     Baseline --  Continue goal through renewed POC.   Status On-going     PT LONG TERM GOAL #5   Title Pt will ambulate x100' with LRAD and close supervision of wife to indicate increased safety with household mobility.   Baseline 10/20: gait x120' with RW and min guard  Continue goal through renewed POC   Status On-going               Plan - 07/14/16 1728    Clinical Impression Statement  Today's essison focused on gait with standard RW and bariatric RW to assess overall gait quality.  PT  noted pt was more upright with decr truck flex with bariatric RW although stability continues to be limited requiring minA from PT.  Pt's wife was educated and demonstrated good ability to provide min guard to pt to progress gait at home.  He would continue to benefit from skilled PT to address his impairments.   Rehab Potential Fair   Clinical Impairments Affecting Rehab Potential Chronic nature of deficits; good family/social support   PT Frequency Other (comment)  6 total sessions   PT Duration 4 weeks   PT Treatment/Interventions ADLs/Self Care Home Management;Balance training;Neuromuscular re-education;Patient/family education;Gait training;Functional mobility training;Therapeutic activities;Therapeutic exercise   PT Next Visit Plan Continue hands-on family training with wife with focus on sit <> stands and household ambulation.   Recommended Other Services 10/20: Sent telephone encounter to [facility] to request order for bari RW. Need to follow up on this.   Consulted and Agree with Plan of Care Patient;Family member/caregiver   Family Member Consulted Wife, Marland Mcalpine      Patient will benefit from skilled therapeutic intervention in order to improve the following deficits and impairments:  Decreased balance, Abnormal gait, Decreased activity tolerance, Decreased coordination, Decreased safety awareness, Decreased strength, Impaired flexibility, Postural dysfunction, Impaired tone,  Decreased mobility  Visit Diagnosis: Other abnormalities of gait and mobility - Plan: PT plan of care cert/re-cert  Unsteadiness on feet - Plan: PT plan of care cert/re-cert  Other lack of coordination - Plan: PT plan of care cert/re-cert     Problem List Patient Active Problem List   Diagnosis Date Noted  . CKD (chronic kidney disease) stage 3, GFR 30-59 ml/min 08/16/2015  . At high risk for falls 08/16/2015  . Subcutaneous nodules 08/16/2015  . Prediabetes 10/07/2013  . Inguinal hernia 03/25/2013  . History of CVA with residual deficit 03/25/2013  . HTN (hypertension) 10/09/2012  . Tobacco abuse 10/09/2012    Katherine Mantle 07/14/2016, 5:49 PM  [facility] [address] [place], [name], [address] Phone: [phone]   Fax:  [phone]  Name: Darrell [name] MRN: [id number] Date of Birth: 07/31/1951

## 2016-07-14 NOTE — Telephone Encounter (Signed)
Dr. Adrian Blackwater,  I've been seeing Mr. Finklea for PT at outpatient neurorehab. The patient's gait pattern is much more stable when pt utilizes a bariatric rolling walker (as opposed to standard walker) due to the pt's height. Mr. Swicegood would benefit from having a personal bariatric rolling walker. Patient and wife are in full agreement.  If you agree, please place an order for a bariatric rolling walker.  Thanks so much,  Billie Ruddy, PT, DPT [facility] [address] [place], [name], [address] Phone: [phone]   Fax:  [phone] 07/14/16, 3:55 PM

## 2016-07-19 ENCOUNTER — Ambulatory Visit: Payer: Medicare Other | Admitting: Physical Therapy

## 2016-07-19 ENCOUNTER — Encounter: Payer: Self-pay | Admitting: Physical Therapy

## 2016-07-19 DIAGNOSIS — R2689 Other abnormalities of gait and mobility: Secondary | ICD-10-CM

## 2016-07-19 DIAGNOSIS — R2681 Unsteadiness on feet: Secondary | ICD-10-CM

## 2016-07-19 DIAGNOSIS — R269 Unspecified abnormalities of gait and mobility: Secondary | ICD-10-CM

## 2016-07-19 DIAGNOSIS — R270 Ataxia, unspecified: Secondary | ICD-10-CM

## 2016-07-19 NOTE — Therapy (Signed)
[facility] [address] [place], [name], [address] Phone: [phone]   Fax:  [phone]  Physical Therapy Treatment  Patient Details  Name: Darrell [name] MRN: [id number] Date of Birth: 1951-07-04 Referring Provider: Boykin Nearing, MD  Encounter Date: 07/19/2016      PT End of Session - 07/19/16 1434    Visit Number 6   Number of Visits 11   Date for PT Re-Evaluation 08/13/16   Authorization Type G code every 10th visit with progress note   PT Start Time 1316   PT Stop Time 1400   PT Time Calculation (min) 44 min   Equipment Utilized During Treatment Gait belt   Activity Tolerance Patient tolerated treatment well  ambulation tolerance limited by fatigue   Behavior During Therapy [facility] for tasks assessed/performed      Past Medical History:  Diagnosis Date  . Chronic kidney disease    stage 3 GFR 30-59 ml/min   . Dysphagia as late effect of cerebrovascular disease    pts wife states pt has to eat soft foods   . GERD (gastroesophageal reflux disease)   . Glaucoma   . High cholesterol   . Hypertension   . Neuromuscular disorder ([place])    chronic inflammatory demyelinating polyneuropathy   . Stroke [facility])    2011 with residual deficit left sided weakness  . Tobacco dependence     Past Surgical History:  Procedure Laterality Date  . EYE SURGERY    . INGUINAL HERNIA REPAIR Right 11/23/2014   Procedure: right inguinal hernia repair with mesh;  Surgeon: Armandina Gemma, MD;  Location: [facility];  Service: General;  Laterality: Right;  . INSERTION OF MESH N/A 11/23/2014   Procedure: INSERTION OF MESH;  Surgeon: Armandina Gemma, MD;  Location: [facility];  Service: General;  Laterality: N/A;  . SHOULDER SURGERY Bilateral 1988, 1998    There were no vitals filed for this visit.      Subjective Assessment - 07/19/16 1321    Subjective Pt reports no issues or falls since last visit and states that he has some numbness on  the back of his R hand.   Patient is accompained by: Family member  spouse, [name]   Pertinent History h/o CVA in 2011 with L-sided weakness, CIDP 2012   Patient Stated Goals Improve walking-to walk without needing help (devices or people); and ot ride a bicycle   Currently in Pain? No/denies   Multiple Pain Sites No                         [facility] Adult PT Treatment/Exercise - 07/19/16 1315      Transfers   Transfers Sit to Stand;Stand to [name] Transfers   Sit to Stand 4: Min assist;With upper extremity assist;With armrests;From bed;From chair/3-in-1   Sit to Stand Details (indicate cue type and reason) Verbal cuing to incr forward trunk flex in order to prevent post LOB upon standing.  Tactile cuing for foot placement.   Stand to Sit 4: Min assist   Stand to Sit Details Verbal cues to encourage incr forward trunk flex and to reach back with hands for table/chair.   Stand Pivot Transfers 4: Min guard   Stand Pivot Transfer Details (indicate cue type and reason) Pt instructed to fully turn around before sitting and to keep the RW close in order to avoid LOB.     Ambulation/Gait   Ambulation/Gait Yes   Ambulation/Gait  Assistance 4: Min guard;4: Min assist   Ambulation/Gait Assistance Details Pt was able to ambulate longer distances, however, demonstrated incr episodes of LOB requiring minA from PT to correct.  Verbal cues to keep RW close to pt's body and to incr step length bilaterally, especially on the L LE.  Pt was able to demonstrate improvement within session on turns and tight spaces.  PT educated pt's wife in proper guarding technique when walking and allowed her to practice with supervision from PT.   Ambulation Distance (Feet) 115 Feet  1x115, 1x50"   Assistive device Rolling walker  Bariatric RW   Gait Pattern Step-through pattern;Decreased stride length;Decreased hip/knee flexion - right;Decreased hip/knee flexion - left;Decreased dorsiflexion -  right;Decreased dorsiflexion - left;Ataxic;Trunk flexed;Narrow base of support;Poor foot clearance - left;Poor foot clearance - right   Ambulation Surface Level;Indoor     Neuro Re-ed    Neuro Re-ed Details  Pt performed sit<>stands x4 with minA and was instructed to stand wihtout UE support from RW and with PT in front 4x69min.  Pt required min-modA while in standing to maintain balance.                PT Education - 07/19/16 1432    Education provided Yes   Education Details Pt educated on proper technique when standing to avoid LOB posteriorly.  Wife educated on proper guarding technique to progress toward pt ambulating at home.   Person(s) Educated Patient;Spouse   Methods Explanation;Demonstration;Tactile cues;Verbal cues   Comprehension Verbalized understanding;Returned demonstration;Need further instruction          PT Short Term Goals - 06/16/16 1853      PT SHORT TERM GOAL #1   Title STG's = LTG's           PT Long Term Goals - 07/14/16 1742      PT LONG TERM GOAL #1   Title Pt will effectively perform HEP with assist of wife to maximize functional gains made in PT.  (Target date for all LTG's: 08/11/16)   Baseline --  Continue goal through renewed POC.   Status On-going     PT LONG TERM GOAL #2   Title Pt will perform stand pivot transfer from w/c <> mat table with supervision using LRAD to indicate increased safety with functional transfers..    Baseline 10/20: REVISED due to lack of progress.  Continue goal through renewed POC.   Status Revised     PT LONG TERM GOAL #3   Title Pt will consistently perform sit <> stand from standard chair with supervision using LRAD to indicate improved functional LE strength.   Baseline 10/20: REVISED to supervision due to lack of progress.  Continue goal through renewed POC.   Status Revised     PT LONG TERM GOAL #4   Title Pt will perform dynamic standing >/= 3 consecutive minutes with single UE support at  countertop with supervision, no overt LOB to indicate increase safety with ADL's.    Baseline --  Continue goal through renewed POC.   Status On-going     PT LONG TERM GOAL #5   Title Pt will ambulate x100' with LRAD and close supervision of wife to indicate increased safety with household mobility.   Baseline 10/20: gait x120' with RW and min guard  Continue goal through renewed POC   Status On-going               Plan - 07/19/16 1434    Clinical Impression Statement  Pt performed well in session and was able to incr walking distance without requireing rest breaks.  Pt did, however, demonstrate incr episodes of LOB requiring incr assistance from PT during gait.  At this point it is not recommended for pt to ambulate at home with supervision from wife due to lack of practice with wife guarding and decr dynamic and static balance.  Pt would continue to benefit from skilled PT to address his impairments and for further education/practice with wife guarding.   Rehab Potential Fair   Clinical Impairments Affecting Rehab Potential Chronic nature of deficits; good family/social support   PT Frequency Other (comment)  6 total sessions   PT Duration 4 weeks   PT Treatment/Interventions ADLs/Self Care Home Management;Balance training;Neuromuscular re-education;Patient/family education;Gait training;Functional mobility training;Therapeutic activities;Therapeutic exercise   PT Next Visit Plan Continue hands-on family training with wife with focus on sit <> stands and household ambulation.   Consulted and Agree with Plan of Care Patient;Family member/caregiver   Family Member Consulted Wife, Marland Mcalpine      Patient will benefit from skilled therapeutic intervention in order to improve the following deficits and impairments:  Decreased balance, Abnormal gait, Decreased activity tolerance, Decreased coordination, Decreased safety awareness, Decreased strength, Impaired flexibility, Postural  dysfunction, Impaired tone, Decreased mobility  Visit Diagnosis: Other abnormalities of gait and mobility  Unsteadiness on feet  Abnormality of gait  Ataxia     Problem List Patient Active Problem List   Diagnosis Date Noted  . CKD (chronic kidney disease) stage 3, GFR 30-59 ml/min 08/16/2015  . At high risk for falls 08/16/2015  . Subcutaneous nodules 08/16/2015  . Prediabetes 10/07/2013  . Inguinal hernia 03/25/2013  . History of CVA with residual deficit 03/25/2013  . HTN (hypertension) 10/09/2012  . Tobacco abuse 10/09/2012    Katherine Mantle, SPT 07/19/2016, 2:40 PM  [facility] [address] [place], [name], [address] Phone: [phone]   Fax:  [phone]  Name: OLUWASEYI RAFFEL MRN: [id number] Date of Birth: 02-26-1951

## 2016-07-21 ENCOUNTER — Encounter: Payer: Self-pay | Admitting: Physical Therapy

## 2016-07-21 ENCOUNTER — Ambulatory Visit: Payer: Medicare Other | Attending: Family Medicine | Admitting: Family Medicine

## 2016-07-21 ENCOUNTER — Encounter: Payer: Self-pay | Admitting: Family Medicine

## 2016-07-21 ENCOUNTER — Ambulatory Visit: Payer: Medicare Other | Admitting: Physical Therapy

## 2016-07-21 VITALS — BP 138/83 | HR 81 | Temp 98.7°F | Ht 76.0 in | Wt 194.0 lb

## 2016-07-21 DIAGNOSIS — Z7982 Long term (current) use of aspirin: Secondary | ICD-10-CM | POA: Diagnosis not present

## 2016-07-21 DIAGNOSIS — R2689 Other abnormalities of gait and mobility: Secondary | ICD-10-CM | POA: Diagnosis not present

## 2016-07-21 DIAGNOSIS — R270 Ataxia, unspecified: Secondary | ICD-10-CM

## 2016-07-21 DIAGNOSIS — I6930 Unspecified sequelae of cerebral infarction: Secondary | ICD-10-CM | POA: Diagnosis not present

## 2016-07-21 DIAGNOSIS — N183 Chronic kidney disease, stage 3 (moderate): Secondary | ICD-10-CM | POA: Diagnosis not present

## 2016-07-21 DIAGNOSIS — Z23 Encounter for immunization: Secondary | ICD-10-CM | POA: Diagnosis not present

## 2016-07-21 DIAGNOSIS — R222 Localized swelling, mass and lump, trunk: Secondary | ICD-10-CM | POA: Diagnosis not present

## 2016-07-21 DIAGNOSIS — F17210 Nicotine dependence, cigarettes, uncomplicated: Secondary | ICD-10-CM | POA: Diagnosis not present

## 2016-07-21 DIAGNOSIS — Z0000 Encounter for general adult medical examination without abnormal findings: Secondary | ICD-10-CM | POA: Diagnosis not present

## 2016-07-21 DIAGNOSIS — R229 Localized swelling, mass and lump, unspecified: Secondary | ICD-10-CM

## 2016-07-21 DIAGNOSIS — I129 Hypertensive chronic kidney disease with stage 1 through stage 4 chronic kidney disease, or unspecified chronic kidney disease: Secondary | ICD-10-CM | POA: Insufficient documentation

## 2016-07-21 DIAGNOSIS — Z8673 Personal history of transient ischemic attack (TIA), and cerebral infarction without residual deficits: Secondary | ICD-10-CM | POA: Diagnosis not present

## 2016-07-21 DIAGNOSIS — R269 Unspecified abnormalities of gait and mobility: Secondary | ICD-10-CM

## 2016-07-21 DIAGNOSIS — R2681 Unsteadiness on feet: Secondary | ICD-10-CM

## 2016-07-21 MED ORDER — ZOSTER VACCINE LIVE 19400 UNT/0.65ML ~~LOC~~ SUSR: 0.6500 mL | Freq: Once | SUBCUTANEOUS | 0 refills | Status: AC

## 2016-07-21 MED ORDER — DOXYCYCLINE HYCLATE 100 MG PO TABS: 100.0000 mg | ORAL_TABLET | Freq: Two times a day (BID) | ORAL | 0 refills | Status: DC

## 2016-07-21 NOTE — Assessment & Plan Note (Signed)
A: patient now with two skin nodules. The one on is neck is chronic and asymptomatic. The one on his upper back is inflamed P: Course of doxycyline General surgery referral for removal

## 2016-07-21 NOTE — Telephone Encounter (Signed)
Rx written and given to patient at today's OV

## 2016-07-21 NOTE — Progress Notes (Signed)
Pt has knot on back and knot on his neck.

## 2016-07-21 NOTE — Therapy (Signed)
[facility] [address] [place], [name], [address] Phone: [phone]   Fax:  [phone]  Physical Therapy Treatment  Patient Details  Name: Darrell [name] MRN: [id number] Date of Birth: 1951-02-17 Referring Provider: Boykin Nearing, MD  Encounter Date: 07/21/2016      PT End of Session - 07/21/16 1219    Visit Number 7   Number of Visits 11   Date for PT Re-Evaluation 08/13/16   Authorization Type G code every 10th visit with progress note   PT Start Time 1101   PT Stop Time 1147   PT Time Calculation (min) 46 min   Equipment Utilized During Treatment Gait belt   Activity Tolerance Patient tolerated treatment well  ambulation tolerance limited by fatigue   Behavior During Therapy [facility] for tasks assessed/performed      Past Medical History:  Diagnosis Date  . Chronic kidney disease    stage 3 GFR 30-59 ml/min   . Dysphagia as late effect of cerebrovascular disease    pts wife states pt has to eat soft foods   . GERD (gastroesophageal reflux disease)   . Glaucoma   . High cholesterol   . Hypertension   . Neuromuscular disorder ([place])    chronic inflammatory demyelinating polyneuropathy   . Stroke [facility])    2011 with residual deficit left sided weakness  . Tobacco dependence     Past Surgical History:  Procedure Laterality Date  . EYE SURGERY    . INGUINAL HERNIA REPAIR Right 11/23/2014   Procedure: right inguinal hernia repair with mesh;  Surgeon: Armandina Gemma, MD;  [facility];  Service: General;  Laterality: Right;  . INSERTION OF MESH N/A 11/23/2014   Procedure: INSERTION OF MESH;  Surgeon: Armandina Gemma, MD;  [facility];  Service: General;  Laterality: N/A;  . SHOULDER SURGERY Bilateral 1988, 1998    There were no vitals filed for this visit.      Subjective Assessment - 07/21/16 1102    Subjective Pt reports no issues or falls since last visit stating he's been "doing pretty good".    Patient is accompained by: Family member  spouse, [name]   Pertinent History h/o CVA in 2011 with L-sided weakness, CIDP 2012   Patient Stated Goals Improve walking-to walk without needing help (devices or people); and ot ride a bicycle   Currently in Pain? No/denies   Multiple Pain Sites No                         [facility] Adult PT Treatment/Exercise - 07/21/16 1100      Transfers   Transfers Sit to Stand;Stand to [employer]   Sit to Stand 4: Min guard;With upper extremity assist;From bed;With armrests;From chair/3-in-1   Sit to Stand Details (indicate cue type and reason) Verbal cuin gto incr forward trunk flexion to standing quality and post lean.   Stand to Sit 4: Min guard;With upper extremity assist;With armrests;To bed   Stand Pivot Transfers 4: Min guard;With armrests   Stand Pivot Transfer Details (indicate cue type and reason) Verbal cuing to fully turn around before sitting and to stay close to the RW when turning.     Ambulation/Gait   Ambulation/Gait Yes   Ambulation/Gait Assistance 4: Min guard   Ambulation/Gait Assistance Details Verbal cuing for pt to stay inside the RW and prevent excessive forward trunk lean due to pushing RW too far in front.  During  turns pt cued to continue to step inside the RW.  Pt's wife educated on proper guarding techniques to assess her ability to safely guard pt when ambulating short distances at home like going to the bathroom.   Ambulation Distance (Feet) 100 Feet  1x100', 1x60', 1x30'   Assistive device Rolling walker  Bariatric   Gait Pattern Step-through pattern;Decreased stride length;Decreased hip/knee flexion - right;Decreased hip/knee flexion - left;Decreased dorsiflexion - right;Decreased dorsiflexion - left;Ataxic;Trunk flexed;Narrow base of support;Poor foot clearance - left;Poor foot clearance - right   Ambulation Surface Level;Indoor                PT Education - 07/21/16 1216    Education  provided Yes   Education Details Pt's wife instructed on safe guarding techniques when ambulating with pt at home (i.e. - going to the bathroom, kitchen, etc)   Person(s) Educated Spouse   Methods Explanation;Verbal cues;Demonstration   Comprehension Verbalized understanding;Returned demonstration          PT Short Term Goals - 06/16/16 1853      PT SHORT TERM GOAL #1   Title STG's = LTG's           PT Long Term Goals - 07/14/16 1742      PT LONG TERM GOAL #1   Title Pt will effectively perform HEP with assist of wife to maximize functional gains made in PT.  (Target date for all LTG's: 08/11/16)   Baseline --  Continue goal through renewed POC.   Status On-going     PT LONG TERM GOAL #2   Title Pt will perform stand pivot transfer from w/c <> mat table with supervision using LRAD to indicate increased safety with functional transfers..    Baseline 10/20: REVISED due to lack of progress.  Continue goal through renewed POC.   Status Revised     PT LONG TERM GOAL #3   Title Pt will consistently perform sit <> stand from standard chair with supervision using LRAD to indicate improved functional LE strength.   Baseline 10/20: REVISED to supervision due to lack of progress.  Continue goal through renewed POC.   Status Revised     PT LONG TERM GOAL #4   Title Pt will perform dynamic standing >/= 3 consecutive minutes with single UE support at countertop with supervision, no overt LOB to indicate increase safety with ADL's.    Baseline --  Continue goal through renewed POC.   Status On-going     PT LONG TERM GOAL #5   Title Pt will ambulate x100' with LRAD and close supervision of wife to indicate increased safety with household mobility.   Baseline 10/20: gait x120' with RW and min guard  Continue goal through renewed POC   Status On-going               Plan - 07/21/16 1219    Clinical Impression Statement Session focused on gait training for home ambulation  and assessing pt's wife's ability to safely guard himwhen goin got the bathroom, kitchen, etc.  After multiple trials of gait training, PT recommended to pt and wife that they not attempt walking at home even for short distances due to safety concerns.  He would continue to benefit from skilled PT to further address his impairments and assess if home ambulation is a possibility.   Rehab Potential Fair   Clinical Impairments Affecting Rehab Potential Chronic nature of deficits; good family/social support   PT Frequency Other (comment)  6  total sessions   PT Duration 4 weeks   PT Treatment/Interventions ADLs/Self Care Home Management;Balance training;Neuromuscular re-education;Patient/family education;Gait training;Functional mobility training;Therapeutic activities;Therapeutic exercise   PT Next Visit Plan Attempt squat pivot transfers. Discuss BSC (as w/c doesn't fit into bathroom). Continue hands-on family training with wife with focus on sit <> stands and household ambulation.   Consulted and Agree with Plan of Care Patient;Family member/caregiver   Family Member Consulted Wife, Marland Mcalpine      Patient will benefit from skilled therapeutic intervention in order to improve the following deficits and impairments:  Decreased balance, Abnormal gait, Decreased activity tolerance, Decreased coordination, Decreased safety awareness, Decreased strength, Impaired flexibility, Postural dysfunction, Impaired tone, Decreased mobility  Visit Diagnosis: Other abnormalities of gait and mobility  Ataxia  Abnormality of gait  Unsteadiness on feet     Problem List Patient Active Problem List   Diagnosis Date Noted  . CKD (chronic kidney disease) stage 3, GFR 30-59 ml/min 08/16/2015  . At high risk for falls 08/16/2015  . Subcutaneous nodules 08/16/2015  . Prediabetes 10/07/2013  . Inguinal hernia 03/25/2013  . History of CVA with residual deficit 03/25/2013  . HTN (hypertension) 10/09/2012  .  Tobacco abuse 10/09/2012    Katherine Mantle, SPT 07/21/2016, 12:29 PM  [facility] [address] [place], [name], [address] Phone: [phone]   Fax:  [phone]  Name: Darrell [name] MRN: [id number] Date of Birth: [birth year]

## 2016-07-21 NOTE — Progress Notes (Signed)
Subjective:  Patient ID: Darrell [name], male    DOB: 1950/11/25  Age: 65 y.o. MRN: [id number]  CC: No chief complaint on file.   HPI Darrell [name] has hx of HTN, CKD stage 3, CVA, current smoker he  Presents with his wife  for   1. Skin problem: 5 days of pain, redness and swelling in mid back. Redness and pain are improving with use of OTC topical antibiotic. No fever or trauma. He has hx of chronic nodule on L side of neck.   2. Hx of CVA with residual weakness: working with PT. Enjoying therapy. Request bariatric walker and shower chair.   Social History  Substance Use Topics  . Smoking status: Current Every Day Smoker    Packs/day: 1.00    Years: 40.00    Types: Cigarettes  . Smokeless tobacco: Never Used  . Alcohol use Yes     Comment: alcohol free for 1 year was drinking 1/2 pint per day      Outpatient Medications Prior to Visit  Medication Sig Dispense Refill  . amLODipine (NORVASC) 10 MG tablet Take 1 tablet (10 mg total) by mouth every morning. 90 tablet 2  . aspirin EC 81 MG tablet Take 1 tablet (81 mg total) by mouth daily. 90 tablet 3  . atorvastatin (LIPITOR) 40 MG tablet Take 1 tablet (40 mg total) by mouth daily. 90 tablet 3  . brimonidine (ALPHAGAN P) 0.1 % SOLN Place 1 drop into both eyes 2 (two) times daily.    . diclofenac sodium (VOLTAREN) 1 % GEL Apply 2 g topically 4 (four) times daily. 100 g 5  . dorzolamide-timolol (COSOPT) 22.3-6.8 MG/ML ophthalmic solution Place 1 drop into both eyes 2 (two) times daily.    . hydrALAZINE (APRESOLINE) 25 MG tablet Take 1 tablet (25 mg total) by mouth 3 (three) times daily. 270 tablet 3  . latanoprost (XALATAN) 0.005 % ophthalmic solution Place 1 drop into both eyes at bedtime.    Marland Kitchen lisinopril (PRINIVIL,ZESTRIL) 40 MG tablet Take 1 tablet (40 mg total) by mouth daily. 90 tablet 3  . zoster vaccine live, PF, (ZOSTAVAX) 81856 UNT/0.65ML injection Inject 19,400 Units into the skin once. 1 each 0   No facility-administered  medications prior to visit.     ROS Review of Systems  Constitutional: Negative for chills, fatigue, fever and unexpected weight change.  HENT: Positive for trouble swallowing and voice change. Negative for congestion and rhinorrhea.   Eyes: Negative for visual disturbance.  Respiratory: Negative for cough and shortness of breath.   Cardiovascular: Negative for chest pain, palpitations and leg swelling.  Gastrointestinal: Negative for abdominal pain, blood in stool, constipation, diarrhea, nausea and vomiting.  Endocrine: Negative for polydipsia, polyphagia and polyuria.  Musculoskeletal: Positive for arthralgias. Negative for back pain, gait problem, joint swelling, myalgias and neck pain.  Skin: Negative for rash.  Allergic/Immunologic: Negative for immunocompromised state.  Neurological: Positive for weakness (in lower extermities, L >R ). Negative for light-headedness and headaches.  Hematological: Negative for adenopathy. Does not bruise/bleed easily.  Psychiatric/Behavioral: Negative for dysphoric mood, sleep disturbance and suicidal ideas. The patient is not nervous/anxious.     Objective:  BP 138/83 (BP Location: Left Arm, Patient Position: Sitting, Cuff Size: Small)   Pulse 81   Temp 98.7 F (37.1 C) (Oral)   Ht 6\' 4"  (1.93 m)   Wt 194 lb (88 kg)   SpO2 99%   BMI 23.61 kg/m   BP/Weight 07/21/2016 05/25/2016 11/26/2015  Systolic BP 492 010 071  Diastolic BP 83 82 86  Wt. (Lbs) 194 - -  BMI 23.61 - -   Wt Readings from Last 3 Encounters:  07/21/16 194 lb (88 kg)  11/25/15 185 lb (83.9 kg)  08/16/15 190 lb (86.2 kg)    Physical Exam  Constitutional: He appears well-developed and well-nourished. No distress.  Thin adult male  Sitting in wheelchair   HENT:  Head: Normocephalic and atraumatic.  Neck: Normal range of motion. Neck supple.    Cardiovascular: Normal rate, regular rhythm, normal heart sounds and intact distal pulses.   Pulmonary/Chest: Effort normal  and breath sounds normal.  Musculoskeletal: He exhibits no edema.       Back:  Neurological: He is alert.  Skin: Skin is warm and dry. No rash noted. No erythema.  Psychiatric: He has a normal mood and affect.     Assessment & Plan:  [place] was seen today for skin problem.  Diagnoses and all orders for this visit:  Subcutaneous nodules -     Discontinue: doxycycline (VIBRA-TABS) 100 MG tablet; Take 1 tablet (100 mg total) by mouth 2 (two) times daily. -     doxycycline (VIBRA-TABS) 100 MG tablet; Take 1 tablet (100 mg total) by mouth 2 (two) times daily. -     Ambulatory referral to [place] maintenance -     Zoster Vaccine Live, PF, (ZOSTAVAX) 21975 UNT/0.65ML injection; Inject 19,400 Units into the skin once.   There are no diagnoses linked to this encounter.  No orders of the defined types were placed in this encounter.   Follow-up: Return in about 2 months (around 09/20/2016) for skin nodules .   Boykin Nearing MD

## 2016-07-21 NOTE — Patient Instructions (Signed)
Bariatric rolling walker - Bring doctor's order and face sheet to one of these medical supply stores:  1. Medical supply stores: [facility] on [facility] [address] [place], [place], [place] [address] [phone]  2. [facility] on [facility] or in [place] [phone] for both locations  3. [employer] on [place] [phone]

## 2016-07-21 NOTE — Assessment & Plan Note (Signed)
Rx for bariatric walker and shower chair provided

## 2016-07-21 NOTE — Patient Instructions (Addendum)
Darrell [name] was seen today for back pain.  Diagnoses and all orders for this visit:  Subcutaneous nodules -     Discontinue: doxycycline (VIBRA-TABS) 100 MG tablet; Take 1 tablet (100 mg total) by mouth 2 (two) times daily. -     doxycycline (VIBRA-TABS) 100 MG tablet; Take 1 tablet (100 mg total) by mouth 2 (two) times daily. -     Ambulatory referral to [place] maintenance -     Zoster Vaccine Live, PF, (ZOSTAVAX) 81771 UNT/0.65ML injection; Inject 19,400 Units into the skin once.   Take the doxycycline which has been sent here and [facility] on [facility] if the redness and soreness in your back does not continue to improve   F/u in 2 months sooner if needed  Dr. Adrian Blackwater

## 2016-07-24 MED FILL — DOXYCYCLINE 100 MG TABLET: 100 | 10 days supply | Qty: 20 | Fill #0

## 2016-07-25 ENCOUNTER — Ambulatory Visit: Payer: Medicare Other | Admitting: Physical Therapy

## 2016-07-28 ENCOUNTER — Ambulatory Visit: Payer: Medicare Other | Attending: Family Medicine | Admitting: Physical Therapy

## 2016-07-28 ENCOUNTER — Telehealth: Payer: Self-pay | Admitting: Family Medicine

## 2016-07-28 DIAGNOSIS — R2681 Unsteadiness on feet: Secondary | ICD-10-CM | POA: Insufficient documentation

## 2016-07-28 DIAGNOSIS — R2689 Other abnormalities of gait and mobility: Secondary | ICD-10-CM

## 2016-07-28 MED FILL — hydrALAZINE HCL 25 MG TABS: 25 | 30 days supply | Qty: 90 | Fill #2

## 2016-07-28 NOTE — Telephone Encounter (Signed)
Pt. Wife came in requesting to speak with pt. PCP regarding one of his medications. Please f/u.

## 2016-07-28 NOTE — Therapy (Signed)
[facility] [address] [place], [name], [address] Phone: [phone]   Fax:  [phone]  Physical Therapy Treatment  Patient Details  Name: Darrell [name] MRN: [id number] Date of Birth: 1951-06-08 Referring Provider: Boykin Nearing, MD  Encounter Date: 07/28/2016      PT End of Session - 07/28/16 1300    Visit Number 8   Number of Visits 11   Date for PT Re-Evaluation 08/13/16   Authorization Type G code every 10th visit with progress note   PT Start Time 1154   PT Stop Time 1239   PT Time Calculation (min) 45 min   Activity Tolerance Patient tolerated treatment well   Behavior During Therapy [facility] for tasks assessed/performed      Past Medical History:  Diagnosis Date  . Chronic kidney disease    stage 3 GFR 30-59 ml/min   . Dysphagia as late effect of cerebrovascular disease    pts wife states pt has to eat soft foods   . GERD (gastroesophageal reflux disease)   . Glaucoma   . High cholesterol   . Hypertension   . Neuromuscular disorder ([place])    chronic inflammatory demyelinating polyneuropathy   . Stroke [facility])    2011 with residual deficit left sided weakness  . Tobacco dependence     Past Surgical History:  Procedure Laterality Date  . EYE SURGERY    . INGUINAL HERNIA REPAIR Right 11/23/2014   Procedure: right inguinal hernia repair with mesh;  Surgeon: Armandina Gemma, MD;  Location: [facility];  Service: General;  Laterality: Right;  . INSERTION OF MESH N/A 11/23/2014   Procedure: INSERTION OF MESH;  Surgeon: Armandina Gemma, MD;  Location: [facility];  Service: General;  Laterality: N/A;  . SHOULDER SURGERY Bilateral 1988, 1998    There were no vitals filed for this visit.      Subjective Assessment - 07/28/16 1250    Subjective When PT introduced idea of squat pivot transfers, wife states, "I think this will save [place] a lot of trouble. He (patient) and I are always griping at each other about the standing  up and walking."   Patient is accompained by: Family member  wife, [name]   Pertinent History h/o CVA in 2011 with L-sided weakness, CIDP 2012   Patient Stated Goals Improve walking-to walk without needing help (devices or people); and ot ride a bicycle   Currently in Pain? No/denies                         [facility] Adult PT Treatment/Exercise - 07/28/16 0001      Transfers   Stand to Sit Details (indicate cue type and reason) --   Squat Pivot Transfers 4: Min guard;5: Comptroller Details (indicate cue type and reason) Cueing for setup, hand/foot placement, to avoid pushing on locked w/c wheel with hand, for management of arm rest, full anterior weight shift, head/hips relationship, and to avoid shearing buttocks.   Lateral/Scoot Transfers 5: Supervision;4: Min guard;With armrests removed;With slide board   Lateral/Scoot Transfer Details (indicate cue type and reason) Cueing for setup, hand/foot placement, to avoid pushing on locked w/c wheel with hand, for management of arm rest, full anterior weight shift, head/hips relationship, and to avoid shearing buttocks.     Ambulation/Gait   Ambulation/Gait No     Chief Technology Officer Yes   Wheelchair Assistance 5: Supervision;4: Min guard  Environmental health practitioner Both lower extermities   Wheelchair Parts Management Needs assistance;Supervision/cueing  (S)/cueing for brake management; assist for all other   Distance 100  x2   Comments w/c mobility with BLE's 2 x100' with (S) for linear propulsion, min guard for turns in smaller spaces.     Self-Care   Self-Care Other Self-Care Comments   Other Self-Care Comments  Showed pt/wife grab bar placement in clinic bathroom and explained, demonstrated safe positioning of w/c, safe hand placement on bar, and technique for squat pivot transfer from w/c <> toilet with BUE support at grab bar. Pt/wife verbalized understanding.                 PT Education - 07/28/16 1251    Education provided Yes   Education Details Squat pivot and slide board transfers: purpose, set up, technique, wife cueing, and blocked practice.   Person(s) Educated Patient;Spouse   Methods Explanation;Demonstration;Tactile cues;Verbal cues   Comprehension Verbalized understanding;Returned demonstration          PT Short Term Goals - 06/16/16 1853      PT SHORT TERM GOAL #1   Title STG's = LTG's           PT Long Term Goals - 07/14/16 1742      PT LONG TERM GOAL #1   Title Pt will effectively perform HEP with assist of wife to maximize functional gains made in PT.  (Target date for all LTG's: 08/11/16)   Baseline --  Continue goal through renewed POC.   Status On-going     PT LONG TERM GOAL #2   Title Pt will perform stand pivot transfer from w/c <> mat table with supervision using LRAD to indicate increased safety with functional transfers..    Baseline 10/20: REVISED due to lack of progress.  Continue goal through renewed POC.   Status Revised     PT LONG TERM GOAL #3   Title Pt will consistently perform sit <> stand from standard chair with supervision using LRAD to indicate improved functional LE strength.   Baseline 10/20: REVISED to supervision due to lack of progress.  Continue goal through renewed POC.   Status Revised     PT LONG TERM GOAL #4   Title Pt will perform dynamic standing >/= 3 consecutive minutes with single UE support at countertop with supervision, no overt LOB to indicate increase safety with ADL's.    Baseline --  Continue goal through renewed POC.   Status On-going     PT LONG TERM GOAL #5   Title Pt will ambulate x100' with LRAD and close supervision of wife to indicate increased safety with household mobility.   Baseline 10/20: gait x120' with RW and min guard  Continue goal through renewed POC   Status On-going               Plan - 07/28/16 1300    Clinical Impression  Statement Skilled session focused on introducing squat pivot and slide board transfers to increase pt safety and to decrease caregiver burden with household mobility. Also, explained and demonstrated to pt how to utilize BLE's to self-propel manual w/c in home environment. Pt and wife both open to modifying mobility to wheelchair-level. Pt/wife plan to practice transfers until next session, then bring barriers/questions to next session.    Rehab Potential Fair   Clinical Impairments Affecting Rehab Potential Chronic nature of deficits; good family/social support   PT Frequency Other (comment)  6 total sessions  PT Duration 4 weeks   PT Treatment/Interventions ADLs/Self Care Home Management;Balance training;Neuromuscular re-education;Patient/family education;Gait training;Functional mobility training;Therapeutic activities;Therapeutic exercise   PT Next Visit Plan Answer pt/wife questions, address barriers to squat pivot transfers. Tentatively plan to DC, pending pt progress.   Consulted and Agree with Plan of Care Patient;Family member/caregiver   Family Member Consulted Wife, Marland Mcalpine      Patient will benefit from skilled therapeutic intervention in order to improve the following deficits and impairments:  Decreased balance, Abnormal gait, Decreased activity tolerance, Decreased coordination, Decreased safety awareness, Decreased strength, Impaired flexibility, Postural dysfunction, Impaired tone, Decreased mobility  Visit Diagnosis: Other abnormalities of gait and mobility  Unsteadiness on feet     Problem List Patient Active Problem List   Diagnosis Date Noted  . CKD (chronic kidney disease) stage 3, GFR 30-59 ml/min 08/16/2015  . At high risk for falls 08/16/2015  . Subcutaneous nodules 08/16/2015  . Prediabetes 10/07/2013  . Inguinal hernia 03/25/2013  . History of CVA with residual deficit 03/25/2013  . HTN (hypertension) 10/09/2012  . Tobacco abuse 10/09/2012   .Billie Ruddy, PT, DPT [facility] [address] [place], [name], [address] Phone: [phone]   Fax:  [phone] 07/28/16, 1:04 PM  Name: KEADEN GUNNOE MRN: [id number] Date of Birth: 1951/01/03

## 2016-08-01 ENCOUNTER — Ambulatory Visit: Payer: Medicare Other | Admitting: Physical Therapy

## 2016-08-01 DIAGNOSIS — R2681 Unsteadiness on feet: Secondary | ICD-10-CM

## 2016-08-01 DIAGNOSIS — R2689 Other abnormalities of gait and mobility: Secondary | ICD-10-CM

## 2016-08-01 NOTE — Therapy (Signed)
[facility] [address] [place], [place], [address] Phone: [phone]   Fax:  [phone]  Physical Therapy Treatment and Discharge Plan  Patient Details  Name: Darrell [name] MRN: [id number] Date of Birth: 1951/05/02 Referring Provider: Dessa Phi, MD  Encounter Date: 08/01/2016      PT End of Session - 08/01/16 1312    Visit Number 9   Number of Visits 11   Date for PT Re-Evaluation 08/13/16   Authorization Type G code every 10th visit with progress note   PT Start Time 1107   PT Stop Time 1147   PT Time Calculation (min) 40 min   Equipment Utilized During Treatment Gait belt   Activity Tolerance Patient tolerated treatment well   Behavior During Therapy [facility] for tasks assessed/performed      Past Medical History:  Diagnosis Date  . Chronic kidney disease    stage 3 GFR 30-59 ml/min   . Dysphagia as late effect of cerebrovascular disease    pts wife states pt has to eat soft foods   . GERD (gastroesophageal reflux disease)   . Glaucoma   . High cholesterol   . Hypertension   . Neuromuscular disorder ([name])    chronic inflammatory demyelinating polyneuropathy   . Stroke [facility])    2011 with residual deficit left sided weakness  . Tobacco dependence     Past Surgical History:  Procedure Laterality Date  . EYE SURGERY    . INGUINAL HERNIA REPAIR Right 11/23/2014   Procedure: right inguinal hernia repair with mesh;  Surgeon: Darnell Level, MD;  Location: [facility];  Service: General;  Laterality: Right;  . INSERTION OF MESH N/A 11/23/2014   Procedure: INSERTION OF MESH;  Surgeon: Darnell Level, MD;  Location: [facility];  Service: General;  Laterality: N/A;  . SHOULDER SURGERY Bilateral 1988, 1998    There were no vitals filed for this visit.      Subjective Assessment - 08/01/16 1259    Subjective "We've been using the wheelchair a lot more around the house."   Patient is accompained by: Family member   wife, Britta Mccreedy   Pertinent History h/o CVA in 2011 with L-sided weakness, CIDP 2012   Patient Stated Goals Improve walking-to walk without needing help (devices or people); and ot ride a bicycle   Currently in Pain? No/denies                         [facility] Adult PT Treatment/Exercise - 08/01/16 0001      Transfers   Transfers Sit to Stand;Stand to Sit;Squat Pivot Transfers   Sit to Stand 4: Min assist   Sit to Stand Details Verbal cues for safe use of DME/AE;Verbal cues for sequencing;Verbal cues for precautions/safety   Sit to Stand Details (indicate cue type and reason) from w/c with RW   Stand to Sit 4: Min assist   Stand to Sit Details to wheelchair with RW   Squat Pivot Transfers 5: Supervision;4: Min guard   Squat Pivot Transfer Details (indicate cue type and reason) supervision to min guard due to pt impulsivity; cueing for safe hand placement, to lock w/c and remove leg rests, arm rest prior to initiating transfer     Ambulation/Gait   Ambulation/Gait Yes   Ambulation/Gait Assistance 3: Mod assist   Ambulation/Gait Assistance Details Gait 2 x35' with RW and min-mod A of wife due to intermittent posterior LOB; wife appropriately provided cueing for  safe use of RW, safe proximity to AD during ambulation.   Ambulation Distance (Feet) 35 Feet  x2   Assistive device Rolling walker   Gait Pattern Step-through pattern;Decreased stride length;Decreased hip/knee flexion - right;Decreased hip/knee flexion - left;Decreased dorsiflexion - right;Decreased dorsiflexion - left;Ataxic;Trunk flexed;Narrow base of support;Poor foot clearance - left;Poor foot clearance - right   Ambulation Surface Level;Indoor     Therapeutic Activites    Therapeutic Activities Other Therapeutic Activities   Other Therapeutic Activities Explained and demonstrated to pt/wife how to safely get onto/off of NuStep, how to adjust seat depth, handle length, and resistance with effective return demo (of  cueing for pt to perform) from wife.                PT Education - 08/01/16 1300    Education provided Yes   Education Details PT goals, progress, and DC plan. Recommending all mobility be performed at wheelchair-level to decrease fall risk. Explained community fitness resources, including Silver Sneakers, and demonstrated how to safely use NuStep.    Person(s) Educated Patient;Spouse   Methods Explanation;Demonstration;Verbal cues;Tactile cues   Comprehension Verbalized understanding;Returned demonstration          PT Short Term Goals - 06/16/16 1853      PT SHORT TERM GOAL #1   Title STG's = LTG's           PT Long Term Goals - 08/01/16 1314      PT LONG TERM GOAL #1   Title Pt will effectively perform HEP with assist of wife to maximize functional gains made in PT.  (Target date for all LTG's: 08/11/16)   Status Achieved     PT LONG TERM GOAL #2   Title Pt will perform stand pivot transfer from w/c <> mat table with supervision using LRAD to indicate increased safety with functional transfers..    Baseline 11/7: requires min A and use of RW   Status Not Met     PT LONG TERM GOAL #3   Title Pt will consistently perform sit <> stand from standard chair with supervision using LRAD to indicate improved functional LE strength.   Baseline 11/7: Requires min A and use of RW   Status Not Met     PT LONG TERM GOAL #4   Title Pt will perform dynamic standing >/= 3 consecutive minutes with single UE support at countertop with supervision, no overt LOB to indicate increase safety with ADL's.    Baseline 11/7: Requires min A   Status Not Met     PT LONG TERM GOAL #5   Title Pt will ambulate x100' with LRAD and close supervision of wife to indicate increased safety with household mobility.   Baseline 11/7: able to ambulate up to 100' with RW and min-mod A of wife   Status Partially Met               Plan - 08/01/16 1317    Clinical Impression Statement  Although pt is able to ambulate further with use of RW, pt continues to require up to mod A during household ambulation. Patient, wife, and this PT engaged in extensive conversation regarding safety concerns with pt ambulating in home with wife providing assistance due to patient impulsivity and instability on feet. This PT strongly recommending that all mobility be performed at a wheelchair-level (e.g. squat pivot transfers as opposed to standing, ambulating) to decrease risk of falls and injury to wife. Pt/wife verbalized understanding. Pt will be discharged  from outpatient PT at this time, as patient appears to have maximized functional mobility and pt/wife are satisfied with pt's current function.    Consulted and Agree with Plan of Care Patient;Family member/caregiver   Family Member Consulted Wife, [name]      Patient will benefit from skilled therapeutic intervention in order to improve the following deficits and impairments:     Visit Diagnosis: Other abnormalities of gait and mobility  Unsteadiness on feet       G-Codes - 08-10-2016 1313    Functional Assessment Tool Used Gait up to 100' with RW and min to mod A; squat pivot transfers with close supervision to min guard   Functional Limitation Mobility: Walking and moving around   Mobility: Walking and Moving Around Goal Status [id number]) At least 40 percent but less than 60 percent impaired, limited or restricted   Mobility: Walking and Moving Around Discharge Status [id number]) At least 60 percent but less than 80 percent impaired, limited or restricted      Problem List Patient Active Problem List   Diagnosis Date Noted  . CKD (chronic kidney disease) stage 3, GFR 30-59 ml/min 08/16/2015  . At high risk for falls 08/16/2015  . Subcutaneous nodules 08/16/2015  . Prediabetes 10/07/2013  . Inguinal hernia 03/25/2013  . History of CVA with residual deficit 03/25/2013  . HTN (hypertension) 10/09/2012  . Tobacco abuse 10/09/2012    PHYSICAL THERAPY DISCHARGE SUMMARY  Visits from Start of Care: 9  Current functional level related to goals / functional outcomes: See above.   Remaining deficits: Hemiplegia, abnormal tone (clonus in B ankles), ataxia, impulsivity, decreased stability/independence with all aspects of functional mobility.   Education / Equipment: See above.  Plan: Patient agrees to discharge.  Patient goals were not met. Patient is being discharged due to being pleased with the current functional level.  ?????          Billie Ruddy, PT, DPT [facility] [address] [place], [name], [address] Phone: [phone]   Fax:  [phone] 2016-08-10, 1:22 PM  Name: WADDELL ITEN MRN: [id number] Date of Birth: 01/31/1951

## 2016-08-02 ENCOUNTER — Telehealth: Payer: Self-pay | Admitting: Family Medicine

## 2016-08-02 NOTE — Telephone Encounter (Signed)
Patient has questions about medication doxycyline. Patient said that the urine has been concentrated. Patient also said they've been experiencing hiccoughs non stop. Please follow up.

## 2016-08-03 ENCOUNTER — Ambulatory Visit: Payer: Medicare Other | Admitting: Physical Therapy

## 2016-08-03 NOTE — Telephone Encounter (Signed)
Will route to PCP 

## 2016-08-04 ENCOUNTER — Telehealth (HOSPITAL_COMMUNITY): Payer: Self-pay | Admitting: Emergency Medicine

## 2016-08-04 ENCOUNTER — Ambulatory Visit: Payer: Medicare Other | Admitting: Physical Therapy

## 2016-08-04 ENCOUNTER — Encounter (HOSPITAL_COMMUNITY): Payer: Self-pay | Admitting: Emergency Medicine

## 2016-08-04 ENCOUNTER — Ambulatory Visit (HOSPITAL_COMMUNITY): Payer: Medicare Other

## 2016-08-04 ENCOUNTER — Ambulatory Visit (HOSPITAL_COMMUNITY)
Admission: EM | Admit: 2016-08-04 | Discharge: 2016-08-04 | Disposition: A | Discharge status: Home / Self Care | Payer: Medicare Other | Attending: Emergency Medicine | Admitting: Emergency Medicine

## 2016-08-04 ENCOUNTER — Ambulatory Visit (INDEPENDENT_AMBULATORY_CARE_PROVIDER_SITE_OTHER): Payer: Medicare Other

## 2016-08-04 DIAGNOSIS — Z7982 Long term (current) use of aspirin: Secondary | ICD-10-CM | POA: Diagnosis not present

## 2016-08-04 DIAGNOSIS — E7800 Pure hypercholesterolemia, unspecified: Secondary | ICD-10-CM | POA: Diagnosis not present

## 2016-08-04 DIAGNOSIS — R7303 Prediabetes: Secondary | ICD-10-CM | POA: Diagnosis not present

## 2016-08-04 DIAGNOSIS — Z888 Allergy status to other drugs, medicaments and biological substances status: Secondary | ICD-10-CM | POA: Insufficient documentation

## 2016-08-04 DIAGNOSIS — N183 Chronic kidney disease, stage 3 (moderate): Secondary | ICD-10-CM | POA: Diagnosis not present

## 2016-08-04 DIAGNOSIS — R822 Biliuria: Secondary | ICD-10-CM | POA: Diagnosis not present

## 2016-08-04 DIAGNOSIS — R066 Hiccough: Secondary | ICD-10-CM | POA: Diagnosis not present

## 2016-08-04 DIAGNOSIS — Z8249 Family history of ischemic heart disease and other diseases of the circulatory system: Secondary | ICD-10-CM | POA: Insufficient documentation

## 2016-08-04 DIAGNOSIS — H409 Unspecified glaucoma: Secondary | ICD-10-CM | POA: Insufficient documentation

## 2016-08-04 DIAGNOSIS — Z79899 Other long term (current) drug therapy: Secondary | ICD-10-CM | POA: Diagnosis not present

## 2016-08-04 DIAGNOSIS — I129 Hypertensive chronic kidney disease with stage 1 through stage 4 chronic kidney disease, or unspecified chronic kidney disease: Secondary | ICD-10-CM | POA: Insufficient documentation

## 2016-08-04 DIAGNOSIS — Z833 Family history of diabetes mellitus: Secondary | ICD-10-CM | POA: Diagnosis not present

## 2016-08-04 DIAGNOSIS — F17210 Nicotine dependence, cigarettes, uncomplicated: Secondary | ICD-10-CM | POA: Insufficient documentation

## 2016-08-04 DIAGNOSIS — K219 Gastro-esophageal reflux disease without esophagitis: Secondary | ICD-10-CM | POA: Insufficient documentation

## 2016-08-04 DIAGNOSIS — Z91013 Allergy to seafood: Secondary | ICD-10-CM | POA: Insufficient documentation

## 2016-08-04 LAB — CBC WITH DIFFERENTIAL/PLATELET
Basophils Absolute: 0 10*3/uL (ref 0.0–0.1)
Basophils Relative: 0 %
Eosinophils Absolute: 0.9 10*3/uL — ABNORMAL HIGH (ref 0.0–0.7)
Eosinophils Relative: 6 %
HEMATOCRIT: 34.7 % — AB (ref 39.0–52.0)
HEMOGLOBIN: 11.7 g/dL — AB (ref 13.0–17.0)
LYMPHS PCT: 11 %
Lymphs Abs: 1.8 10*3/uL (ref 0.7–4.0)
MCH: 29.6 pg (ref 26.0–34.0)
MCHC: 33.7 g/dL (ref 30.0–36.0)
MCV: 87.8 fL (ref 78.0–100.0)
MONO ABS: 1.3 10*3/uL — AB (ref 0.1–1.0)
MONOS PCT: 8 %
NEUTROS ABS: 11.8 10*3/uL — AB (ref 1.7–7.7)
Neutrophils Relative %: 75 %
Platelets: 348 10*3/uL (ref 150–400)
RBC: 3.95 MIL/uL — ABNORMAL LOW (ref 4.22–5.81)
RDW: 14.3 % (ref 11.5–15.5)
WBC: 15.8 10*3/uL — ABNORMAL HIGH (ref 4.0–10.5)

## 2016-08-04 LAB — COMPREHENSIVE METABOLIC PANEL
ALBUMIN: 2.9 g/dL — AB (ref 3.5–5.0)
ALK PHOS: 245 U/L — AB (ref 38–126)
ALT: 72 U/L — ABNORMAL HIGH (ref 17–63)
ANION GAP: 11 (ref 5–15)
AST: 49 U/L — ABNORMAL HIGH (ref 15–41)
BUN: 35 mg/dL — ABNORMAL HIGH (ref 6–20)
CHLORIDE: 103 mmol/L (ref 101–111)
CO2: 22 mmol/L (ref 22–32)
Calcium: 9.8 mg/dL (ref 8.9–10.3)
Creatinine, Ser: 2.37 mg/dL — ABNORMAL HIGH (ref 0.61–1.24)
GFR calc Af Amer: 32 mL/min — ABNORMAL LOW (ref >60)
GFR calc non Af Amer: 27 mL/min — ABNORMAL LOW (ref >60)
GLUCOSE: 130 mg/dL — AB (ref 65–99)
POTASSIUM: 3.5 mmol/L (ref 3.5–5.1)
SODIUM: 136 mmol/L (ref 135–145)
Total Bilirubin: 2.3 mg/dL — ABNORMAL HIGH (ref 0.3–1.2)
Total Protein: 6.6 g/dL (ref 6.5–8.1)

## 2016-08-04 LAB — POCT URINALYSIS DIP (DEVICE)
GLUCOSE, UA: NEGATIVE mg/dL
Hgb urine dipstick: NEGATIVE
KETONES UR: NEGATIVE mg/dL
Leukocytes, UA: NEGATIVE
NITRITE: NEGATIVE
PROTEIN: 30 mg/dL — AB
Specific Gravity, Urine: 1.015 (ref 1.005–1.030)
UROBILINOGEN UA: 4 mg/dL — AB (ref 0.0–1.0)
pH: 5.5 (ref 5.0–8.0)

## 2016-08-04 MED ORDER — BACLOFEN 10 MG PO TABS: 10.0000 mg | ORAL_TABLET | Freq: Three times a day (TID) | ORAL | 0 refills | Status: DC | PRN

## 2016-08-04 MED ORDER — CHLORPROMAZINE HCL 25 MG PO TABS: 25.0000 mg | ORAL_TABLET | Freq: Three times a day (TID) | ORAL | 0 refills | Status: DC | PRN

## 2016-08-04 NOTE — Telephone Encounter (Signed)
Called patient Left VM He has been seen in urgent care for symptoms Will see him in f.u Thorazine prescribed for hiccups

## 2016-08-04 NOTE — Telephone Encounter (Signed)
Called and spoke with patient and his wife.  CMP shows obstructive pattern liver disease.  He also has a slight white count.  I spoke with Dr. Oletta Lamas, GI oncall, who recommended evaluation with CT chest and abdomen to evaluation for cancer.  Reviewed labs with patient and his wife.  I attempted to call [facility], but no answer.  I will call his PCP on Monday to let them know what is going on and that patient needs a CT chest/abdomen as soon as possible.  His wife states the thorazine was too expensive and they couldn't get it.  I have sent in Baclofen instead.  I reviewed reasons to go the ER with his wife, including fevers, abdominal pain, vomiting, or persistent hiccups.  All questions were answered.

## 2016-08-04 NOTE — ED Provider Notes (Signed)
[place]    CSN: [id number] Arrival date & time: 08/04/16  1430     History   Chief Complaint Chief Complaint  Patient presents with  . Hiccups    HPI Darrell [name] is a 65 y.o. male.   HPI  He is a 65 year old man here for evaluation of hiccups. He has had hiccups for the last 4 days. The first 2 days were constant. Yesterday and today have improved, but the hiccups still keep him up at night. He has a history of GERD, but is taking Nexium. He denies any abdominal pain, nausea, or burping. No chest pain. He has had a runny nose and cough for the last 4-5 days. This is associated with generalized weakness. He reports some mild shortness of breath. No fevers or sore throat.  He is also noticed very dark urine. No dysuria or other urinary symptoms.  He was recently placed on doxycycline for an infected cyst on his back.  Past Medical History:  Diagnosis Date  . Chronic kidney disease    stage 3 GFR 30-59 ml/min   . Dysphagia as late effect of cerebrovascular disease    pts wife states pt has to eat soft foods   . GERD (gastroesophageal reflux disease)   . Glaucoma   . High cholesterol   . Hypertension   . Neuromuscular disorder ([place])    chronic inflammatory demyelinating polyneuropathy   . Stroke [facility])    2011 with residual deficit left sided weakness  . Tobacco dependence     Patient Active Problem List   Diagnosis Date Noted  . CKD (chronic kidney disease) stage 3, GFR 30-59 ml/min 08/16/2015  . At high risk for falls 08/16/2015  . Subcutaneous nodules 08/16/2015  . Prediabetes 10/07/2013  . Inguinal hernia 03/25/2013  . History of CVA with residual deficit 03/25/2013  . HTN (hypertension) 10/09/2012  . Tobacco abuse 10/09/2012    Past Surgical History:  Procedure Laterality Date  . EYE SURGERY    . INGUINAL HERNIA REPAIR Right 11/23/2014   Procedure: right inguinal hernia repair with mesh;  Surgeon: Armandina Gemma, MD;  Location: [facility];  Service:  General;  Laterality: Right;  . INSERTION OF MESH N/A 11/23/2014   Procedure: INSERTION OF MESH;  Surgeon: Armandina Gemma, MD;  Location: [facility];  Service: General;  Laterality: N/A;  . SHOULDER SURGERY Bilateral 1988, 1998       Home Medications    Prior to Admission medications   Medication Sig Start Date End Date Taking? Authorizing Provider  amLODipine (NORVASC) 10 MG tablet Take 1 tablet (10 mg total) by mouth every morning. 05/25/16  Yes Boykin Nearing, MD  aspirin EC 81 MG tablet Take 1 tablet (81 mg total) by mouth daily. 05/25/16  Yes Josalyn Funches, MD  atorvastatin (LIPITOR) 40 MG tablet Take 1 tablet (40 mg total) by mouth daily. 05/25/16  Yes Josalyn Funches, MD  brimonidine (ALPHAGAN P) 0.1 % SOLN Place 1 drop into both eyes 2 (two) times daily.   Yes Historical Provider, MD  diclofenac sodium (VOLTAREN) 1 % GEL Apply 2 g topically 4 (four) times daily. 05/25/16  Yes Josalyn Funches, MD  dorzolamide-timolol (COSOPT) 22.3-6.8 MG/ML ophthalmic solution Place 1 drop into both eyes 2 (two) times daily.   Yes Historical Provider, MD  doxycycline (VIBRA-TABS) 100 MG tablet Take 1 tablet (100 mg total) by mouth 2 (two) times daily. 07/21/16  Yes Josalyn Funches, MD  hydrALAZINE (APRESOLINE) 25 MG tablet Take 1  tablet (25 mg total) by mouth 3 (three) times daily. 05/25/16  Yes Josalyn Funches, MD  lisinopril (PRINIVIL,ZESTRIL) 40 MG tablet Take 1 tablet (40 mg total) by mouth daily. 05/25/16  Yes Josalyn Funches, MD  chlorproMAZINE (THORAZINE) 25 MG tablet Take 1 tablet (25 mg total) by mouth 3 (three) times daily as needed for hiccoughs. 08/04/16   Melony Overly, MD  latanoprost (XALATAN) 0.005 % ophthalmic solution Place 1 drop into both eyes at bedtime.    Historical Provider, MD    Family History Family History  Problem Relation Age of Onset  . Hypertension Mother   . Diabetes Sister   . Hypertension Sister     Social History Social History  Substance Use Topics  . Smoking  status: Current Every Day Smoker    Packs/day: 1.00    Years: 40.00    Types: Cigarettes  . Smokeless tobacco: Never Used  . Alcohol use Yes     Comment: alcohol free for 1 year was drinking 1/2 pint per day      Allergies   Ace inhibitors; Atacand hct [candesartan cilexetil-hctz]; and Shellfish allergy   Review of Systems Review of Systems As in history of present illness  Physical Exam Triage Vital Signs ED Triage Vitals  Enc Vitals Group     BP 08/04/16 1444 104/69     Pulse Rate 08/04/16 1444 86     Resp 08/04/16 1444 12     Temp 08/04/16 1444 98.6 F (37 C)     Temp Source 08/04/16 1444 Oral     SpO2 08/04/16 1444 97 %     Weight --      Height --      Head Circumference --      Peak Flow --      Pain Score 08/04/16 1449 5     Pain Loc --      Pain Edu? --      Excl. in [place]? --    No data found.   Updated Vital Signs BP 104/69 (BP Location: Left Arm)   Pulse 86   Temp 98.6 F (37 C) (Oral)   Resp 12   SpO2 97%   Visual Acuity Right Eye Distance:   Left Eye Distance:   Bilateral Distance:    Right Eye Near:   Left Eye Near:    Bilateral Near:     Physical Exam  Constitutional: He is oriented to person, place, and time. He appears well-developed and well-nourished. No distress.  In wheelchair. No hiccups while in the room.  Neck: Neck supple.  Cardiovascular: Normal rate, regular rhythm and normal heart sounds.   No murmur heard. Pulmonary/Chest: Effort normal and breath sounds normal. No respiratory distress. He has no wheezes. He has no rales. He exhibits no tenderness.  Abdominal: Soft. He exhibits no distension. There is no tenderness.  Neurological: He is alert and oriented to person, place, and time.  Hiccups recurred after x-ray No appreciable jaundice.   UC Treatments / Results  Labs (all labs ordered are listed, but only abnormal results are displayed) Labs Reviewed  POCT URINALYSIS DIP (DEVICE) - Abnormal; Notable for the  following:       Result Value   Bilirubin Urine MODERATE (*)    Protein, ur 30 (*)    Urobilinogen, UA 4.0 (*)    All other components within normal limits  COMPREHENSIVE METABOLIC PANEL  CBC WITH DIFFERENTIAL/PLATELET    EKG  EKG Interpretation None  Radiology Dg Chest 1 View  Result Date: 08/04/2016 CLINICAL DATA:  Four days of hiccups, no abdominal or chest pain. Current smoker. EXAM: CHEST 1 VIEW COMPARISON:  Chest x-ray of March 11, 2013 FINDINGS: The lungs are adequately inflated. The interstitial markings are mildly prominent in the mid and lower lung zones. There is no alveolar infiltrate. There is no pleural effusion. The heart and pulmonary vascularity are normal. The mediastinum is normal in width. There are degenerative changes of both shoulders. IMPRESSION: Mild chronic bronchitic changes. No pneumonia, CHF, nor other acute cardiopulmonary abnormality. Electronically Signed   By: David  Martinique M.D.   On: 08/04/2016 15:43    Procedures Procedures (including critical care time)  Medications Ordered in UC Medications - No data to display   Initial Impression / Assessment and Plan / UC Course  I have reviewed the triage vital signs and the nursing notes.  Pertinent labs & imaging results that were available during my care of the patient were reviewed by me and considered in my medical decision making (see chart for details).  Clinical Course     With the persistent hiccups and bilirubin in the urine, I am concerned for something going on in the liver. I have drawn a CBC and CMP. I will call them with the results. Thorazine 3 times a day as needed for hiccups. Recommended follow-up with PCP as soon as possible given the bilirubin in the urine. Especially if his blood work comes back abnormal, he may need imaging of the abdomen. If the Thorazine does not work for the hiccups and he is having difficulty sleeping or eating, he should go to the emergency  room.  Final Clinical Impressions(s) / UC Diagnoses   Final diagnoses:  Bilirubin in urine  Intractable hiccups    New Prescriptions New Prescriptions   CHLORPROMAZINE (THORAZINE) 25 MG TABLET    Take 1 tablet (25 mg total) by mouth 3 (three) times daily as needed for hiccoughs.     Melony Overly, MD 08/04/16 [id number]

## 2016-08-04 NOTE — Discharge Instructions (Signed)
I am concerned that the hiccups are coming from something in your liver that is irritating your diaphragm. You have bilirubin in your urine. This is not supposed to be there. This usually indicates something going on in the liver. We did blood work to check your liver. I will call you with the results this afternoon. It is important to follow-up with your primary care doctor. With the bilirubin in the urine and the hiccups, you may need a CT scan of your belly. Take Thorazine 3 times a day as needed for the hiccups. If this does not work, and the hiccups are preventing you from eating or sleeping, please go to the emergency room.

## 2016-08-04 NOTE — ED Triage Notes (Signed)
Pt c/o hiccups onset 4 days... Reports the first 2 days was more constant but has subsided associated w/abd and chest pain.   Reports he started doxycycline on 10/30 for an abscess that's getting better.   Also has noticed his urine being very dark in color... Denies dysuria  A&O x4... NAD

## 2016-08-07 ENCOUNTER — Telehealth: Payer: Self-pay | Admitting: Family Medicine

## 2016-08-07 DIAGNOSIS — R4781 Slurred speech: Secondary | ICD-10-CM

## 2016-08-07 DIAGNOSIS — R945 Abnormal results of liver function studies: Secondary | ICD-10-CM

## 2016-08-07 DIAGNOSIS — R066 Hiccough: Secondary | ICD-10-CM

## 2016-08-07 DIAGNOSIS — R109 Unspecified abdominal pain: Secondary | ICD-10-CM

## 2016-08-07 DIAGNOSIS — R7989 Other specified abnormal findings of blood chemistry: Secondary | ICD-10-CM

## 2016-08-07 NOTE — Telephone Encounter (Signed)
Will route to PCP 

## 2016-08-07 NOTE — Telephone Encounter (Signed)
His speech is a bit slurred and he states that he feels like he had another stroke. He is drinking more liquids.   Vitals this AM  BP 109/73  HR 76   Plan: Stop lisinopril CT head, chest and abdomen F/u in 3-4 days, will work on moving patients around to accommodate patient on my schedule  Alycia-please schedule CT scan preferably for this week.  Gaetano Net-  I need to see Mr. Shutes for urgent care f/u. please move the 2:45 ED f/u chest pain/establish care  to the walk in schedule and give MR. Owens Shark that appointment spot on 08/10/16.

## 2016-08-07 NOTE — Telephone Encounter (Signed)
Please call house and cell number

## 2016-08-07 NOTE — Addendum Note (Signed)
Addended by: Boykin Nearing on: 08/07/2016 06:05 PM   Modules accepted: Orders

## 2016-08-07 NOTE — Telephone Encounter (Signed)
Patient's wife called the office to speak with PCP regarding husband's condition. Wife stated that pt is very weak and could barely walk. Pt is urinating very frequently, to the point that he has to wear diaper. Wife is very concerned. Please follow up.   Thank you.

## 2016-08-07 NOTE — Telephone Encounter (Signed)
Called back to patient's wife Wess Baney Symptoms started 6 days ago No fever at home. One episode of black emesis last week.   Hiccups stopped with baclofen.  Patient has poor appetite  Drinking ensures Very weak Sitting up today which is an improvement.   He is urinating frequently.

## 2016-08-08 NOTE — Telephone Encounter (Signed)
Pt's wife was called and informed of CT scan appointment.

## 2016-08-09 ENCOUNTER — Ambulatory Visit: Payer: Medicare Other | Admitting: Physical Therapy

## 2016-08-09 NOTE — Telephone Encounter (Signed)
Dr. Adrian Blackwater, Nambe came to me regarding appt. You would like schedule for this patient, unfortunately I don't have availability in the walk in schedule to transfer the 2:45 on your schedule.  I can put this patient on your schedule in addition to your appointments, or schedule Mr. Grassia with [facility] Dy next Monday.

## 2016-08-10 ENCOUNTER — Encounter: Payer: Self-pay | Admitting: Family Medicine

## 2016-08-10 ENCOUNTER — Ambulatory Visit: Payer: Medicare Other | Attending: Family Medicine | Admitting: Family Medicine

## 2016-08-10 VITALS — BP 145/93 | HR 85 | Temp 98.0°F | Ht 76.0 in

## 2016-08-10 DIAGNOSIS — R748 Abnormal levels of other serum enzymes: Secondary | ICD-10-CM

## 2016-08-10 DIAGNOSIS — F17210 Nicotine dependence, cigarettes, uncomplicated: Secondary | ICD-10-CM | POA: Insufficient documentation

## 2016-08-10 DIAGNOSIS — Z8673 Personal history of transient ischemic attack (TIA), and cerebral infarction without residual deficits: Secondary | ICD-10-CM | POA: Insufficient documentation

## 2016-08-10 DIAGNOSIS — Z7982 Long term (current) use of aspirin: Secondary | ICD-10-CM | POA: Diagnosis not present

## 2016-08-10 DIAGNOSIS — I10 Essential (primary) hypertension: Secondary | ICD-10-CM

## 2016-08-10 DIAGNOSIS — I129 Hypertensive chronic kidney disease with stage 1 through stage 4 chronic kidney disease, or unspecified chronic kidney disease: Secondary | ICD-10-CM | POA: Insufficient documentation

## 2016-08-10 DIAGNOSIS — N179 Acute kidney failure, unspecified: Secondary | ICD-10-CM

## 2016-08-10 DIAGNOSIS — N183 Chronic kidney disease, stage 3 (moderate): Secondary | ICD-10-CM | POA: Insufficient documentation

## 2016-08-10 HISTORY — DX: Abnormal levels of other serum enzymes: R74.8

## 2016-08-10 LAB — COMPLETE METABOLIC PANEL WITH GFR
ALBUMIN: 3.1 g/dL — AB (ref 3.6–5.1)
ALK PHOS: 168 U/L — AB (ref 40–115)
ALT: 51 U/L — AB (ref 9–46)
AST: 24 U/L (ref 10–35)
BILIRUBIN TOTAL: 0.8 mg/dL (ref 0.2–1.2)
BUN: 24 mg/dL (ref 7–25)
CO2: 26 mmol/L (ref 20–31)
CREATININE: 1.45 mg/dL — AB (ref 0.70–1.25)
Calcium: 10 mg/dL (ref 8.6–10.3)
Chloride: 104 mmol/L (ref 98–110)
GFR, Est African American: 58 mL/min — ABNORMAL LOW (ref >=60)
GFR, Est Non African American: 51 mL/min — ABNORMAL LOW (ref >=60)
GLUCOSE: 117 mg/dL — AB (ref 65–99)
Potassium: 4.5 mmol/L (ref 3.5–5.3)
SODIUM: 139 mmol/L (ref 135–146)
TOTAL PROTEIN: 6.4 g/dL (ref 6.1–8.1)

## 2016-08-10 LAB — POCT URINALYSIS DIPSTICK
BILIRUBIN UA: NEGATIVE
Blood, UA: NEGATIVE
Glucose, UA: NEGATIVE
KETONES UA: NEGATIVE
Nitrite, UA: NEGATIVE
PH UA: 5.5
Protein, UA: 30
SPEC GRAV UA: 1.015
Urobilinogen, UA: 1

## 2016-08-10 LAB — CBC
HCT: 33.9 % — ABNORMAL LOW (ref 38.5–50.0)
Hemoglobin: 11.1 g/dL — ABNORMAL LOW (ref 13.2–17.1)
MCH: 29 pg (ref 27.0–33.0)
MCHC: 32.7 g/dL (ref 32.0–36.0)
MCV: 88.5 fL (ref 80.0–100.0)
MPV: 9.3 fL (ref 7.5–12.5)
PLATELETS: 447 10*3/uL — AB (ref 140–400)
RBC: 3.83 MIL/uL — AB (ref 4.20–5.80)
RDW: 14.4 % (ref 11.0–15.0)
WBC: 13.1 10*3/uL — ABNORMAL HIGH (ref 3.8–10.8)

## 2016-08-10 MED ORDER — HYDRALAZINE HCL 50 MG PO TABS: 50.0000 mg | ORAL_TABLET | Freq: Three times a day (TID) | ORAL | 5 refills | Status: DC

## 2016-08-10 NOTE — Patient Instructions (Addendum)
Diagnoses and all orders for this visit:  AKI (acute kidney injury) ([place]) -     COMPLETE METABOLIC PANEL WITH GFR -     CBC -     POCT urinalysis dipstick  Elevated liver enzymes -     COMPLETE METABOLIC PANEL WITH GFR  Essential hypertension -     hydrALAZINE (APRESOLINE) 50 MG tablet; Take 1 tablet (50 mg total) by mouth 3 (three) times daily.   For HTN increase hydralazine to 50 mg three times a day since I had you stop lisinopirl  Complete scans tomorrow I will call with lab and scan results   F/u in 4 weeks for HTN   Dr. Adrian Blackwater

## 2016-08-10 NOTE — Assessment & Plan Note (Signed)
AKI  Following recent decline in health Plan: Repeat CMP

## 2016-08-10 NOTE — Telephone Encounter (Signed)
Please ask [name] to come in today at 4:15 PM I am seeing Mr. Darrell [name] this AM

## 2016-08-10 NOTE — Progress Notes (Signed)
Subjective:  Patient ID: Darrell [name], male    DOB: Jan 29, 1951  Age: 65 y.o. MRN: [id number]  CC: Follow-up   HPI Darrell [name] has hx of CVA (09/2012), HTN, CKD stage 3 he presents for   1. F/u urgent care visit: he developed hiccups with nausea and emesis episode last week after starting doxycycline for inflamed cyst on his back. He went to urgent care and found to AKI, elevated urine bilirubin and abnormal liver enzymes with obstructive pattern. His hiccups resolved with baclofen. He has not had recurrent emesis. He is weaker than usual and developed slurred speech last week. His speech has improved but is not back to baseline.   He has CT head, chest and abdomen pending. His lisinopril was stopped due to AKI  Social History  Substance Use Topics  . Smoking status: Current Every Day Smoker    Packs/day: 1.00    Years: 40.00    Types: Cigarettes  . Smokeless tobacco: Never Used  . Alcohol use Yes     Comment: alcohol free for 1 year was drinking 1/2 pint per day    Outpatient Medications Prior to Visit  Medication Sig Dispense Refill  . amLODipine (NORVASC) 10 MG tablet Take 1 tablet (10 mg total) by mouth every morning. 90 tablet 2  . aspirin EC 81 MG tablet Take 1 tablet (81 mg total) by mouth daily. 90 tablet 3  . atorvastatin (LIPITOR) 40 MG tablet Take 1 tablet (40 mg total) by mouth daily. 90 tablet 3  . baclofen (LIORESAL) 10 MG tablet Take 1 tablet (10 mg total) by mouth 3 (three) times daily as needed (hiccups). 30 each 0  . brimonidine (ALPHAGAN P) 0.1 % SOLN Place 1 drop into both eyes 2 (two) times daily.    . diclofenac sodium (VOLTAREN) 1 % GEL Apply 2 g topically 4 (four) times daily. 100 g 5  . dorzolamide-timolol (COSOPT) 22.3-6.8 MG/ML ophthalmic solution Place 1 drop into both eyes 2 (two) times daily.    . hydrALAZINE (APRESOLINE) 25 MG tablet Take 1 tablet (25 mg total) by mouth 3 (three) times daily. 270 tablet 3  . latanoprost (XALATAN) 0.005 % ophthalmic  solution Place 1 drop into both eyes at bedtime.     No facility-administered medications prior to visit.     ROS Review of Systems  Constitutional: Negative for chills, fatigue, fever and unexpected weight change.  HENT: Positive for trouble swallowing and voice change. Negative for congestion and rhinorrhea.   Eyes: Negative for visual disturbance.  Respiratory: Negative for cough and shortness of breath.   Cardiovascular: Negative for chest pain, palpitations and leg swelling.  Gastrointestinal: Negative for abdominal pain, blood in stool, constipation, diarrhea, nausea and vomiting.  Endocrine: Negative for polydipsia, polyphagia and polyuria.  Musculoskeletal: Positive for arthralgias. Negative for back pain, gait problem, joint swelling, myalgias and neck pain.  Skin: Negative for rash.  Allergic/Immunologic: Negative for immunocompromised state.  Neurological: Positive for speech difficulty and weakness (in lower extermities, L >R ). Negative for light-headedness and headaches.  Hematological: Negative for adenopathy. Does not bruise/bleed easily.  Psychiatric/Behavioral: Negative for dysphoric mood, sleep disturbance and suicidal ideas. The patient is not nervous/anxious.     Objective:  BP (!) 145/93 (BP Location: Left Arm, Patient Position: Sitting, Cuff Size: Small)   Pulse 85   Temp 98 F (36.7 C) (Oral)   Ht 6\' 4"  (1.93 m)   SpO2 99%   BP/Weight 08/10/2016 08/04/2016 59/56/3875  Systolic  BP 510 258 527  Diastolic BP 93 69 83  Wt. (Lbs) - - 194  BMI - - 23.61   Physical Exam  Constitutional: He appears well-developed and well-nourished. No distress.  Thin adult male  Sitting in wheelchair   HENT:  Head: Normocephalic and atraumatic.  Eyes: Conjunctivae and EOM are normal. Pupils are equal, round, and reactive to light.  Neck: Normal range of motion. Neck supple.    Cardiovascular: Normal rate, regular rhythm, normal heart sounds and intact distal pulses.     Pulmonary/Chest: Effort normal and breath sounds normal.  Musculoskeletal: He exhibits no edema.       Back:  Neurological: He is alert. No cranial nerve deficit. Gait abnormal.  Decreased strength in legs   Skin: Skin is warm and dry. No rash noted. No erythema.  Psychiatric: He has a normal mood and affect.   UA: negative bilirubin, small LE, 30 protein   Assessment & Plan:  Darrell [name] was seen today for follow-up.  Diagnoses and all orders for this visit:  AKI (acute kidney injury) ([place]) -     COMPLETE METABOLIC PANEL WITH GFR -     CBC -     POCT urinalysis dipstick  Elevated liver enzymes -     COMPLETE METABOLIC PANEL WITH GFR  Essential hypertension -     hydrALAZINE (APRESOLINE) 50 MG tablet; Take 1 tablet (50 mg total) by mouth 3 (three) times daily.   There are no diagnoses linked to this encounter.  No orders of the defined types were placed in this encounter.   Follow-up: No Follow-up on file.   Boykin Nearing MD

## 2016-08-10 NOTE — Assessment & Plan Note (Signed)
Elevated liver enzymes Plan: CMP

## 2016-08-10 NOTE — Assessment & Plan Note (Signed)
A: HTN with elevated  Med: compliant, stopped lisinopril due to AKI P: Increase hydralazine to 50 gm TID

## 2016-08-11 ENCOUNTER — Encounter: Payer: Self-pay | Admitting: Family Medicine

## 2016-08-11 ENCOUNTER — Ambulatory Visit (HOSPITAL_COMMUNITY)
Admission: RE | Admit: 2016-08-11 | Discharge: 2016-08-11 | Disposition: A | Discharge status: Home / Self Care | Payer: Medicare Other | Source: Ambulatory Visit | Attending: Family Medicine | Admitting: Family Medicine

## 2016-08-11 ENCOUNTER — Telehealth: Payer: Self-pay | Admitting: Family Medicine

## 2016-08-11 ENCOUNTER — Ambulatory Visit (HOSPITAL_COMMUNITY): Payer: Medicare Other

## 2016-08-11 ENCOUNTER — Ambulatory Visit: Payer: Medicare Other | Admitting: Physical Therapy

## 2016-08-11 DIAGNOSIS — N2889 Other specified disorders of kidney and ureter: Secondary | ICD-10-CM | POA: Insufficient documentation

## 2016-08-11 DIAGNOSIS — Z8673 Personal history of transient ischemic attack (TIA), and cerebral infarction without residual deficits: Secondary | ICD-10-CM | POA: Insufficient documentation

## 2016-08-11 DIAGNOSIS — R4781 Slurred speech: Secondary | ICD-10-CM | POA: Diagnosis present

## 2016-08-11 DIAGNOSIS — N281 Cyst of kidney, acquired: Secondary | ICD-10-CM | POA: Insufficient documentation

## 2016-08-11 DIAGNOSIS — R7989 Other specified abnormal findings of blood chemistry: Secondary | ICD-10-CM | POA: Diagnosis not present

## 2016-08-11 DIAGNOSIS — R109 Unspecified abdominal pain: Secondary | ICD-10-CM | POA: Diagnosis not present

## 2016-08-11 DIAGNOSIS — K5730 Diverticulosis of large intestine without perforation or abscess without bleeding: Secondary | ICD-10-CM | POA: Diagnosis not present

## 2016-08-11 DIAGNOSIS — R066 Hiccough: Secondary | ICD-10-CM | POA: Diagnosis not present

## 2016-08-11 DIAGNOSIS — G319 Degenerative disease of nervous system, unspecified: Secondary | ICD-10-CM | POA: Diagnosis not present

## 2016-08-11 DIAGNOSIS — R945 Abnormal results of liver function studies: Secondary | ICD-10-CM

## 2016-08-11 DIAGNOSIS — R160 Hepatomegaly, not elsewhere classified: Secondary | ICD-10-CM | POA: Insufficient documentation

## 2016-08-11 NOTE — Telephone Encounter (Signed)
Called patient and his wife   All labs have improved  Renal function is back to baseline There is still abnormalities in liver function test but they have also improved WBC is still elevated but less than last check Hemoglobin stable Elevated platelets   No changes to plan  Proceed with CT scans

## 2016-08-14 ENCOUNTER — Telehealth: Payer: Self-pay | Admitting: Family Medicine

## 2016-08-14 DIAGNOSIS — N2889 Other specified disorders of kidney and ureter: Secondary | ICD-10-CM

## 2016-08-14 DIAGNOSIS — D1803 Hemangioma of intra-abdominal structures: Secondary | ICD-10-CM

## 2016-08-14 DIAGNOSIS — N281 Cyst of kidney, acquired: Secondary | ICD-10-CM

## 2016-08-14 HISTORY — DX: Other specified disorders of kidney and ureter: N28.89

## 2016-08-14 HISTORY — DX: Hemangioma of intra-abdominal structures: D18.03

## 2016-08-14 NOTE — Assessment & Plan Note (Signed)
Stable between 2011 and 2017

## 2016-08-14 NOTE — Telephone Encounter (Signed)
Patient's wife called the office because she missed our call. If she or husband don't answer the call please detailed message. Call house number.    Thank you.

## 2016-08-14 NOTE — Telephone Encounter (Signed)
Called patient  Spoke to his wife Reviewed CT scans Patient in good spirits Appetite and smoking less Strength is still declined but not to baseline No recurrent nausea or vomiting.  Patient's wife requested update  be sent to nephrologist. Pearson Grippe with [facility] note, labs, CT scan faxed.

## 2016-08-14 NOTE — Assessment & Plan Note (Signed)
Small R renal mass F.u MR abdomen ordered to r/o renal cell carcinoma Patient with CKD so CT with contrast avoided

## 2016-08-14 NOTE — Telephone Encounter (Signed)
Called patient Left VM with CT scan results and recommendations  Reviewed CT head No evidence of acute/new stroke   Patient called Reviewed CT abdomen and chest  Small mass in right kidney on CT abdomen, this is new Cyst in kidneys some with bleeding appearance  Stable mass in liver stable from 2011, with appearence of hemangioma, benign collection of blood vessels in liver  No evidence of malignancy/cancer  Ordered f/u MRI to better evaluate new right kidney mass

## 2016-08-14 NOTE — Telephone Encounter (Signed)
Pt was called and informed of follow up ct scan appointment for 11/30 at 4:00 pm at [facility]

## 2016-08-23 ENCOUNTER — Encounter (HOSPITAL_COMMUNITY)
Admission: RE | Admit: 2016-08-23 | Discharge: 2016-08-23 | Disposition: A | Discharge status: Home / Self Care | Payer: Medicare Other | Source: Ambulatory Visit | Attending: Family Medicine | Admitting: Family Medicine

## 2016-08-23 DIAGNOSIS — N2889 Other specified disorders of kidney and ureter: Secondary | ICD-10-CM | POA: Diagnosis not present

## 2016-08-23 MED FILL — AMLODIPINE BESYLATE 10 MG T: 10 | 30 days supply | Qty: 30 | Fill #2

## 2016-08-23 MED FILL — ATORVASTATIN 40 MG TABLET: 40 | 30 days supply | Qty: 30 | Fill #2

## 2016-08-29 MED FILL — hydrALAZINE HCL 50 MG TABS: 50 | 30 days supply | Qty: 90 | Fill #0

## 2016-08-30 ENCOUNTER — Telehealth: Payer: Self-pay | Admitting: Family Medicine

## 2016-08-30 ENCOUNTER — Telehealth: Payer: Self-pay

## 2016-08-30 NOTE — Telephone Encounter (Signed)
Patient wife called just wanted to inform you she did receive message regarding MRI result. And she is happy everything is ok.

## 2016-08-30 NOTE — Telephone Encounter (Signed)
Pt was called and a VM was left informing pt of MRI results.

## 2016-09-12 ENCOUNTER — Encounter: Payer: Self-pay | Admitting: Family Medicine

## 2016-09-12 ENCOUNTER — Other Ambulatory Visit: Payer: Self-pay

## 2016-09-12 ENCOUNTER — Ambulatory Visit: Payer: Medicare Other | Attending: Family Medicine | Admitting: Family Medicine

## 2016-09-12 VITALS — BP 142/83 | HR 84 | Temp 98.3°F | Ht 76.0 in | Wt 170.0 lb

## 2016-09-12 DIAGNOSIS — Z8673 Personal history of transient ischemic attack (TIA), and cerebral infarction without residual deficits: Secondary | ICD-10-CM | POA: Diagnosis not present

## 2016-09-12 DIAGNOSIS — K5900 Constipation, unspecified: Secondary | ICD-10-CM | POA: Diagnosis not present

## 2016-09-12 DIAGNOSIS — I129 Hypertensive chronic kidney disease with stage 1 through stage 4 chronic kidney disease, or unspecified chronic kidney disease: Secondary | ICD-10-CM | POA: Insufficient documentation

## 2016-09-12 DIAGNOSIS — R6883 Chills (without fever): Secondary | ICD-10-CM | POA: Diagnosis not present

## 2016-09-12 DIAGNOSIS — Z7982 Long term (current) use of aspirin: Secondary | ICD-10-CM | POA: Insufficient documentation

## 2016-09-12 DIAGNOSIS — I10 Essential (primary) hypertension: Secondary | ICD-10-CM | POA: Diagnosis not present

## 2016-09-12 DIAGNOSIS — J0110 Acute frontal sinusitis, unspecified: Secondary | ICD-10-CM

## 2016-09-12 DIAGNOSIS — F17210 Nicotine dependence, cigarettes, uncomplicated: Secondary | ICD-10-CM | POA: Insufficient documentation

## 2016-09-12 DIAGNOSIS — R630 Anorexia: Secondary | ICD-10-CM

## 2016-09-12 DIAGNOSIS — H409 Unspecified glaucoma: Secondary | ICD-10-CM

## 2016-09-12 DIAGNOSIS — I499 Cardiac arrhythmia, unspecified: Secondary | ICD-10-CM | POA: Diagnosis not present

## 2016-09-12 DIAGNOSIS — N183 Chronic kidney disease, stage 3 (moderate): Secondary | ICD-10-CM | POA: Insufficient documentation

## 2016-09-12 LAB — CBC
HCT: 38.1 % — ABNORMAL LOW (ref 38.5–50.0)
Hemoglobin: 12.4 g/dL — ABNORMAL LOW (ref 13.2–17.1)
MCH: 28.4 pg (ref 27.0–33.0)
MCHC: 32.5 g/dL (ref 32.0–36.0)
MCV: 87.2 fL (ref 80.0–100.0)
MPV: 9.2 fL (ref 7.5–12.5)
Platelets: 295 10*3/uL (ref 140–400)
RBC: 4.37 MIL/uL (ref 4.20–5.80)
RDW: 14.8 % (ref 11.0–15.0)
WBC: 8 10*3/uL (ref 3.8–10.8)

## 2016-09-12 MED ORDER — POLYETHYLENE GLYCOL 3350 17 GM/SCOOP PO POWD: 17.0000 g | Freq: Every day | ORAL | 1 refills | Status: DC

## 2016-09-12 MED ORDER — FLUTICASONE PROPIONATE 50 MCG/ACT NA SUSP: 2.0000 | Freq: Every day | NASAL | 6 refills | Status: DC

## 2016-09-12 MED ORDER — MIRTAZAPINE 7.5 MG PO TABS: 7.5000 mg | ORAL_TABLET | Freq: Every day | ORAL | 2 refills | Status: DC

## 2016-09-12 MED FILL — MIRTAZAPINE 7.5 MG TABLET: 7.5 | 30 days supply | Qty: 30 | Fill #0

## 2016-09-12 MED FILL — POLYETHYLENE GLYCOL 3350 PO: 15 days supply | Qty: 255 | Fill #0

## 2016-09-12 MED FILL — FLUTICASONE PROP 50 MCG SPR: 50 | 20 days supply | Qty: 16 | Fill #0

## 2016-09-12 NOTE — Progress Notes (Signed)
Subjective:  Patient ID: Darrell [name], male    DOB: 1951-06-19  Age: 65 y.o. MRN: [id number]  CC: Hypertension   HPI REICHEN HUTZLER has hx of CVA (09/2012), HTN, CKD stage 3 he presents for   1 HTN: taking hydralazine 50 mg TID, also norvasc 10 mg daily. Having HA for past week. No CP. Has chronic cough and SOB. Cough is dry. No leg swelling.   2. Cold symptoms: for past week. R frontal and temporal headache. Congestion, cough, chills. No fever. His appetite   Social History  Substance Use Topics  . Smoking status: Current Every Day Smoker    Packs/day: 1.00    Years: 40.00    Types: Cigarettes  . Smokeless tobacco: Never Used  . Alcohol use Yes     Comment: alcohol free for 1 year was drinking 1/2 pint per day    Outpatient Medications Prior to Visit  Medication Sig Dispense Refill  . amLODipine (NORVASC) 10 MG tablet Take 1 tablet (10 mg total) by mouth every morning. 90 tablet 2  . aspirin EC 81 MG tablet Take 1 tablet (81 mg total) by mouth daily. 90 tablet 3  . atorvastatin (LIPITOR) 40 MG tablet Take 1 tablet (40 mg total) by mouth daily. 90 tablet 3  . brimonidine (ALPHAGAN P) 0.1 % SOLN Place 1 drop into both eyes 2 (two) times daily.    . diclofenac sodium (VOLTAREN) 1 % GEL Apply 2 g topically 4 (four) times daily. 100 g 5  . dorzolamide-timolol (COSOPT) 22.3-6.8 MG/ML ophthalmic solution Place 1 drop into both eyes 2 (two) times daily.    . hydrALAZINE (APRESOLINE) 50 MG tablet Take 1 tablet (50 mg total) by mouth 3 (three) times daily. 90 tablet 5  . latanoprost (XALATAN) 0.005 % ophthalmic solution Place 1 drop into both eyes at bedtime.    . baclofen (LIORESAL) 10 MG tablet Take 1 tablet (10 mg total) by mouth 3 (three) times daily as needed (hiccups). (Patient not taking: Reported on 09/12/2016) 30 each 0   No facility-administered medications prior to visit.     ROS Review of Systems  Constitutional: Positive for appetite change and chills. Negative for  fatigue, fever and unexpected weight change.  HENT: Positive for sore throat, trouble swallowing and voice change. Negative for congestion and rhinorrhea.   Eyes: Positive for visual disturbance (blind in R eye ).  Respiratory: Positive for cough. Negative for shortness of breath.   Cardiovascular: Negative for chest pain, palpitations and leg swelling.  Gastrointestinal: Positive for constipation. Negative for abdominal pain, blood in stool, diarrhea, nausea and vomiting.  Endocrine: Negative for polydipsia, polyphagia and polyuria.  Musculoskeletal: Positive for arthralgias. Negative for back pain, gait problem, joint swelling, myalgias and neck pain.  Skin: Negative for rash.  Allergic/Immunologic: Negative for immunocompromised state.  Neurological: Positive for speech difficulty and weakness (in lower extermities, L >R ). Negative for light-headedness and headaches.  Hematological: Negative for adenopathy. Does not bruise/bleed easily.  Psychiatric/Behavioral: Negative for dysphoric mood, sleep disturbance and suicidal ideas. The patient is not nervous/anxious.     Objective:  BP (!) 142/83 (BP Location: Left Arm, Patient Position: Sitting, Cuff Size: Small)   Pulse 84   Temp 98.3 F (36.8 C) (Oral)   Ht 6\' 4"  (1.93 m)   SpO2 99%   BP/Weight 09/12/2016 08/10/2016 51/76/1607  Systolic BP 371 062 694  Diastolic BP 83 93 69  Wt. (Lbs) - - -  BMI - - -  There were no vitals filed for this visit.  Physical Exam  Constitutional: He appears well-developed and well-nourished. No distress.  Thin adult male  Sitting in wheelchair   HENT:  Head: Normocephalic and atraumatic.  Eyes: Conjunctivae and EOM are normal. Right pupil is not reactive. Right pupil is round. Left pupil is round and reactive.  Neck: Normal range of motion. Neck supple.    Cardiovascular: Normal rate, regular rhythm, normal heart sounds and intact distal pulses.   Pulmonary/Chest: Effort normal and breath  sounds normal.  Musculoskeletal: He exhibits no edema.       Back:  Neurological: He is alert. No cranial nerve deficit. Gait abnormal.  Decreased strength in legs   Skin: Skin is warm and dry. No rash noted. No erythema.  Psychiatric: He has a normal mood and affect.   EKG: normal EKG, normal sinus rhythm, unchanged from previous tracings.   Assessment & Plan:  Kailon was seen today for hypertension.  Diagnoses and all orders for this visit:  Chills -     CBC  Irregular heart rate -     EKG 12-Lead  Constipation, unspecified constipation type -     Discontinue: polyethylene glycol powder (GLYCOLAX/MIRALAX) powder; Take 17 g by mouth daily. -     polyethylene glycol powder (GLYCOLAX/MIRALAX) powder; Take 17 g by mouth daily.  Essential hypertension  Decreased appetite -     Discontinue: mirtazapine (REMERON) 7.5 MG tablet; Take 1 tablet (7.5 mg total) by mouth at bedtime. -     mirtazapine (REMERON) 7.5 MG tablet; Take 1 tablet (7.5 mg total) by mouth at bedtime.  Acute non-recurrent frontal sinusitis -     Discontinue: fluticasone (FLONASE) 50 MCG/ACT nasal spray; Place 2 sprays into both nostrils daily. -     fluticasone (FLONASE) 50 MCG/ACT nasal spray; Place 2 sprays into both nostrils daily.   There are no diagnoses linked to this encounter.  No orders of the defined types were placed in this encounter.   Follow-up: Return in about 4 weeks (around 10/10/2016) for weight and appetite check .   Boykin Nearing MD

## 2016-09-12 NOTE — Assessment & Plan Note (Signed)
BP slightly elevated Compliant with meds Continue current regimen

## 2016-09-12 NOTE — Assessment & Plan Note (Signed)
Add miralax.

## 2016-09-12 NOTE — Assessment & Plan Note (Signed)
flonase Decongestant Call if no better by day 10 of illness for amoxicillin

## 2016-09-12 NOTE — Patient Instructions (Addendum)
Darrell [name] was seen today for hypertension.  Diagnoses and all orders for this visit:  Chills -     CBC  Irregular heart rate -     EKG 12-Lead  Constipation, unspecified constipation type -     polyethylene glycol powder (GLYCOLAX/MIRALAX) powder; Take 17 g by mouth daily.  Essential hypertension  Decreased appetite -     mirtazapine (REMERON) 7.5 MG tablet; Take 1 tablet (7.5 mg total) by mouth at bedtime.  Acute non-recurrent frontal sinusitis -     fluticasone (FLONASE) 50 MCG/ACT nasal spray; Place 2 sprays into both nostrils daily.  take a cough medicine with a decongestant for the next 3 days for headache Also tylenol and flonase  If headache does not improve or worsen, please call and I will order amoxicillin.    F/u in 4 weeks for weight and appetite check   Dr. Adrian Blackwater

## 2016-09-12 NOTE — Assessment & Plan Note (Signed)
Having headache Advised opthalmology evaluation for intraocular pressure check Patient has appt scheduled for next month.

## 2016-09-20 ENCOUNTER — Telehealth: Payer: Self-pay | Admitting: Family Medicine

## 2016-09-20 NOTE — Telephone Encounter (Signed)
Patients wife called requesting lab results. Please follow up.

## 2016-10-16 MED FILL — DORZOLAMIDE-TIMOLOL EYE DRP: 22.3-6.8 | 30 days supply | Qty: 10 | Fill #0

## 2016-10-16 MED FILL — LATANOPROST 0.005% EYE DRP: 0.005 | 30 days supply | Qty: 3 | Fill #0

## 2016-10-16 MED FILL — BRIMONIDINE 0.2% EYE DROP: 0.2 | 30 days supply | Qty: 10 | Fill #0

## 2016-10-18 MED FILL — hydrALAZINE HCL 50 MG TABS: 50 | 30 days supply | Qty: 90 | Fill #1

## 2016-10-18 MED FILL — AMLODIPINE BESYLATE 10 MG T: 10 | 30 days supply | Qty: 30 | Fill #3

## 2016-10-18 MED FILL — ATORVASTATIN 40 MG TABLET: 40 | 30 days supply | Qty: 30 | Fill #3

## 2016-10-25 DIAGNOSIS — H401134 Primary open-angle glaucoma, bilateral, indeterminate stage: Secondary | ICD-10-CM

## 2016-10-25 DIAGNOSIS — H2513 Age-related nuclear cataract, bilateral: Secondary | ICD-10-CM

## 2016-10-25 HISTORY — DX: Primary open-angle glaucoma, bilateral, indeterminate stage: H40.1134

## 2016-10-25 HISTORY — DX: Age-related nuclear cataract, bilateral: H25.13

## 2016-12-05 ENCOUNTER — Ambulatory Visit: Payer: Medicare Other | Admitting: Family Medicine

## 2016-12-06 ENCOUNTER — Emergency Department (HOSPITAL_COMMUNITY)
Admission: EM | Admit: 2016-12-06 | Discharge: 2016-12-06 | Disposition: A | Discharge status: Home / Self Care | Payer: Medicare PPO | Attending: Emergency Medicine | Admitting: Emergency Medicine

## 2016-12-06 ENCOUNTER — Encounter (HOSPITAL_COMMUNITY): Payer: Self-pay | Admitting: Emergency Medicine

## 2016-12-06 ENCOUNTER — Emergency Department (HOSPITAL_COMMUNITY): Payer: Medicare PPO

## 2016-12-06 DIAGNOSIS — F17210 Nicotine dependence, cigarettes, uncomplicated: Secondary | ICD-10-CM | POA: Diagnosis not present

## 2016-12-06 DIAGNOSIS — Y999 Unspecified external cause status: Secondary | ICD-10-CM | POA: Insufficient documentation

## 2016-12-06 DIAGNOSIS — S0181XA Laceration without foreign body of other part of head, initial encounter: Secondary | ICD-10-CM | POA: Insufficient documentation

## 2016-12-06 DIAGNOSIS — Z7982 Long term (current) use of aspirin: Secondary | ICD-10-CM | POA: Insufficient documentation

## 2016-12-06 DIAGNOSIS — Z79899 Other long term (current) drug therapy: Secondary | ICD-10-CM | POA: Insufficient documentation

## 2016-12-06 DIAGNOSIS — S0990XA Unspecified injury of head, initial encounter: Secondary | ICD-10-CM | POA: Diagnosis present

## 2016-12-06 DIAGNOSIS — Y939 Activity, unspecified: Secondary | ICD-10-CM | POA: Diagnosis not present

## 2016-12-06 DIAGNOSIS — N183 Chronic kidney disease, stage 3 (moderate): Secondary | ICD-10-CM | POA: Diagnosis not present

## 2016-12-06 DIAGNOSIS — W2209XA Striking against other stationary object, initial encounter: Secondary | ICD-10-CM | POA: Insufficient documentation

## 2016-12-06 DIAGNOSIS — Z8673 Personal history of transient ischemic attack (TIA), and cerebral infarction without residual deficits: Secondary | ICD-10-CM | POA: Diagnosis not present

## 2016-12-06 DIAGNOSIS — I129 Hypertensive chronic kidney disease with stage 1 through stage 4 chronic kidney disease, or unspecified chronic kidney disease: Secondary | ICD-10-CM | POA: Diagnosis not present

## 2016-12-06 DIAGNOSIS — Y929 Unspecified place or not applicable: Secondary | ICD-10-CM | POA: Insufficient documentation

## 2016-12-06 MED ORDER — LIDOCAINE-EPINEPHRINE (PF) 2 %-1:200000 IJ SOLN
10.0000 mL | Freq: Once | INTRAMUSCULAR | Status: AC
  Administered 2016-12-06: 10 mL
  Filled 2016-12-06: qty 20

## 2016-12-06 NOTE — Discharge Instructions (Signed)
It was my pleasure taking care of you today!   Keep wound clean with mild soap and water. Keep area covered with a topical antibiotic ointment and bandage, keep bandage dry, and do not submerge in water for 24 hours. Ice and elevate for additional pain relief and swelling. Alternate between ibuprofen and Tylenol for additional pain relief. Follow up with your primary care doctor, [facility] or ER in approximately 5 days for wound recheck and suture removal. Monitor area for signs of infection to include, but not limited to: increasing pain, spreading redness, drainage/pus, worsening swelling, or fevers. Return to emergency department for emergent changing or worsening symptoms.   WOUND CARE Keep area clean and dry for 24 hours. Do not remove bandage, if applied. After 24 hours,you should change it at least once a day. Also, change the dressing if it becomes wet or dirty, or as directed by your caregiver.  Wash the wound with soap and water 2 times a day. Rinse the wound off with water to remove all soap. Pat the wound dry with a clean towel.  You may shower as usual after the first 24 hours. Do not soak the wound in water until the sutures are removed.  Return if you experience any of the following signs of infection: Swelling, redness, pus drainage, streaking, fever >101.0 F Return if you experience excessive bleeding that does not stop after 15-20 minutes of constant, firm pressure.

## 2016-12-06 NOTE — ED Provider Notes (Signed)
[facility] DEPT Provider Note   CSN: [id number] Arrival date & time: 12/06/16  1148  By signing my name below, I, Darrell [name], attest that this documentation has been prepared under the direction and in the presence of Darrell P. Ward, PA-C. Electronically Signed: Ethelle Lyon [name], Scribe. 12/06/2016. 1:46 PM.  History   Chief Complaint Chief Complaint  Patient presents with  . Fall   The history is provided by the patient and medical records. No language interpreter was used.    HPI Comments:  [name] is a 66 y.o. male with a PMHx of CKD, HTN, GERD, prior CVA, and Neuromusclar disorder, who presents to the Emergency Department for evaluation following mechanical fall at around 10:30 am. No LOC or change in mental status. + laceration from hitting the front of his head.  Pt reports getting dressed this morning with his wife when he fell and struck his forehead on the door. Denies neck pain or pain anywhere else. Bleeding is controlled at this time. No alleviating factors noted. He takes a 81mg  ASA at home with no other antiplatelets or anticoagulants noted. Pt denies numbness and any other complaints at this time.   Past Medical History:  Diagnosis Date  . Chronic kidney disease    stage 3 GFR 30-59 ml/min   . Dysphagia as late effect of cerebrovascular disease    pts wife states pt has to eat soft foods   . GERD (gastroesophageal reflux disease)   . Glaucoma   . High cholesterol   . Hypertension   . Neuromuscular disorder (Darrell [name])    chronic inflammatory demyelinating polyneuropathy   . Stroke Darrell [name])    2011 with residual deficit left sided weakness  . Tobacco dependence     Patient Active Problem List   Diagnosis Date Noted  . Constipation 09/12/2016  . Acute non-recurrent frontal sinusitis 09/12/2016  . Glaucoma 09/12/2016  . Liver hemangioma 08/14/2016  . Renal cyst 08/14/2016  . Renal mass, right 08/14/2016  . AKI (acute kidney injury) ([place]) 08/10/2016  .  Elevated liver enzymes 08/10/2016  . CKD (chronic kidney disease) stage 3, GFR 30-59 ml/min 08/16/2015  . At high risk for falls 08/16/2015  . Subcutaneous nodules 08/16/2015  . Prediabetes 10/07/2013  . Inguinal hernia 03/25/2013  . History of CVA with residual deficit 03/25/2013  . HTN (hypertension) 10/09/2012  . Tobacco abuse 10/09/2012    Past Surgical History:  Procedure Laterality Date  . EYE SURGERY    . INGUINAL HERNIA REPAIR Right 11/23/2014   Procedure: right inguinal hernia repair with mesh;  Surgeon: Darrell Gemma, MD;  Location: [facility] ORS;  Service: General;  Laterality: Right;  . INSERTION OF MESH N/A 11/23/2014   Procedure: INSERTION OF MESH;  Surgeon: Darrell Gemma, MD;  Location: [facility] ORS;  Service: General;  Laterality: N/A;  . SHOULDER SURGERY Bilateral 1988, 1998    Home Medications    Prior to Admission medications   Medication Sig Start Date End Date Taking? Authorizing Provider  amLODipine (NORVASC) 10 MG tablet Take 1 tablet (10 mg total) by mouth every morning. 05/25/16   Darrell Nearing, MD  aspirin EC 81 MG tablet Take 1 tablet (81 mg total) by mouth daily. 05/25/16   Darrell Funches, MD  atorvastatin (LIPITOR) 40 MG tablet Take 1 tablet (40 mg total) by mouth daily. 05/25/16   Darrell Funches, MD  brimonidine (ALPHAGAN P) 0.1 % SOLN Place 1 drop into both eyes 2 (two) times daily.    Historical  Provider, MD  diclofenac sodium (VOLTAREN) 1 % GEL Apply 2 g topically 4 (four) times daily. 05/25/16   Darrell Funches, MD  dorzolamide-timolol (COSOPT) 22.3-6.8 MG/ML ophthalmic solution Place 1 drop into both eyes 2 (two) times daily.    Historical Provider, MD  fluticasone (FLONASE) 50 MCG/ACT nasal spray Place 2 sprays into both nostrils daily. 09/12/16   Darrell Funches, MD  hydrALAZINE (APRESOLINE) 50 MG tablet Take 1 tablet (50 mg total) by mouth 3 (three) times daily. 08/10/16   Darrell Funches, MD  latanoprost (XALATAN) 0.005 % ophthalmic solution Place 1 drop into both  eyes at bedtime.    Historical Provider, MD  mirtazapine (REMERON) 7.5 MG tablet Take 1 tablet (7.5 mg total) by mouth at bedtime. 09/12/16   Darrell Nearing, MD  Multiple Vitamins-Iron (DAILY VITAMIN FORMULA+IRON) TABS Take 1 tablet by mouth daily.    Historical Provider, MD  polyethylene glycol powder (GLYCOLAX/MIRALAX) powder Take 17 g by mouth daily. 09/12/16   Darrell Funches, MD  Psyllium (EQ DAILY FIBER PO) Take 1 tablet by mouth daily.    Historical Provider, MD    Family History Family History  Problem Relation Age of Onset  . Hypertension Mother   . Diabetes Sister   . Hypertension Sister     Social History Social History  Substance Use Topics  . Smoking status: Current Every Day Smoker    Packs/day: 1.00    Years: 40.00    Types: Cigarettes  . Smokeless tobacco: Never Used  . Alcohol use Yes     Comment: alcohol free for 1 year was drinking 1/2 pint per day      Allergies   Ace inhibitors; Doxycycline; Atacand hct [candesartan cilexetil-hctz]; and Shellfish allergy   Review of Systems Review of Systems  Musculoskeletal: Negative for myalgias.  Skin: Positive for wound.  Neurological: Negative for syncope and numbness.     Physical Exam Updated Vital Signs BP 146/99 (BP Location: Left Arm)   Pulse 102   Temp 98 F (36.7 C) (Oral)   Resp 17   Ht 6' 3.5" (1.918 m)   Wt 194 lb (88 kg)   SpO2 99%   BMI 23.93 kg/m   Physical Exam  Constitutional: He is oriented to person, place, and time. He appears well-developed and well-nourished. No distress.  HENT:  Head: Normocephalic and atraumatic. Head is without raccoon's eyes.  Neck: Neck supple. No spinous process tenderness present.  Cardiovascular: Normal rate, regular rhythm and normal heart sounds.   No murmur heard. Pulmonary/Chest: Effort normal and breath sounds normal. No respiratory distress. He has no wheezes.  Musculoskeletal: Normal range of motion.  Neurological: He is alert and oriented to  person, place, and time.  Speech and motor baseline per patient and wife at bedside. Strength and sensation intact.   Skin: Skin is warm and dry.  2.5cm linear laceration of the left forehead.   Nursing note and vitals reviewed.    ED Treatments / Results  DIAGNOSTIC STUDIES:  Oxygen Saturation is 99% on RA, normal by my interpretation.    COORDINATION OF CARE:  1:46 PM Discussed treatment plan with pt at bedside including CT Head and laceration repair and pt agreed to plan.  Labs (all labs ordered are listed, but only abnormal results are displayed) Labs Reviewed - No data to display  EKG  EKG Interpretation None       Radiology No results found.  Procedures Procedures (including critical care time) LACERATION REPAIR PROCEDURE NOTE The patient's  identification was confirmed and consent was obtained. This procedure was performed by PA student under direct supervision of me,  Fran Lowes. Ward, PA-C at 1:47 PM. Site: Left Forehead Sterile procedures observed Anesthetic used (type and amt): Lidocaine 2% with epi Suture size:6-0 Length:2.5 cm  # of Sutures: 6 Technique:simple interrupted Complexity simple Antibx ointment applied Tetanus UTD Site anesthetized, irrigated with NS, explored without evidence of foreign body, wound well approximated, site covered with dry, sterile dressing.  Patient tolerated procedure well without complications. Instructions for care discussed verbally and patient provided with additional written instructions for homecare and f/u.   Medications Ordered in ED Medications  lidocaine-EPINEPHrine (XYLOCAINE W/EPI) 2 %-1:200000 (PF) injection 10 mL (not administered)     Initial Impression / Assessment and Plan / ED Course  I have reviewed the triage vital signs and the nursing notes.  Pertinent labs & imaging results that were available during my care of the patient were reviewed by me and considered in my medical decision making (see  chart for details).    Darrell [name] is a 66 y.o. male who presents to ED for laceration to the head after mechanical fall this morning. Tetanus up to date. History of prior stroke with residual deficits but no new deficits per patient and wife at bedside. Patient on baby ASA daily but no other anti-coagulants. CT head negative. Laceration repaired as dictated above. Patient counseled on home wound care. Follow up with PCP/urgent care or return to ER for suture removal in 5 days. Patient was urged to return to the Emergency Department for worsening pain, swelling, expanding erythema especially if it streaks away from the affected area, fever, or for any additional concerns. Patient verbalized understanding. All questions answered.     Final Clinical Impressions(s) / ED Diagnoses   Final diagnoses:  None    New Prescriptions New Prescriptions   No medications on file   I personally performed the services described in this documentation, which was scribed in my presence. The recorded information has been reviewed and is accurate.     Ozella Almond Ward, PA-C 12/06/16 1640    Courtney Paris, MD 12/06/16 [phone]

## 2016-12-06 NOTE — ED Triage Notes (Signed)
Pt was getting dressed with wife and fell into door and hit head on door and has laceration to forehead.Pt denies any LOC or pain any where else. Pt has a history of a stroke and does not ambulate without assistance. Bleeding is controlled and head laceration wrapped with sterile gauze.

## 2016-12-06 NOTE — ED Notes (Signed)
Called CT.  They stated pt has 2 people ahead of him.

## 2016-12-12 ENCOUNTER — Emergency Department (HOSPITAL_COMMUNITY)
Admission: EM | Admit: 2016-12-12 | Discharge: 2016-12-12 | Disposition: A | Discharge status: Home / Self Care | Payer: Medicare PPO | Attending: Emergency Medicine | Admitting: Emergency Medicine

## 2016-12-12 ENCOUNTER — Encounter (HOSPITAL_COMMUNITY): Payer: Self-pay

## 2016-12-12 DIAGNOSIS — Z7982 Long term (current) use of aspirin: Secondary | ICD-10-CM | POA: Insufficient documentation

## 2016-12-12 DIAGNOSIS — Z79899 Other long term (current) drug therapy: Secondary | ICD-10-CM | POA: Insufficient documentation

## 2016-12-12 DIAGNOSIS — I129 Hypertensive chronic kidney disease with stage 1 through stage 4 chronic kidney disease, or unspecified chronic kidney disease: Secondary | ICD-10-CM | POA: Insufficient documentation

## 2016-12-12 DIAGNOSIS — F17210 Nicotine dependence, cigarettes, uncomplicated: Secondary | ICD-10-CM | POA: Diagnosis not present

## 2016-12-12 DIAGNOSIS — N183 Chronic kidney disease, stage 3 (moderate): Secondary | ICD-10-CM | POA: Insufficient documentation

## 2016-12-12 DIAGNOSIS — Z8673 Personal history of transient ischemic attack (TIA), and cerebral infarction without residual deficits: Secondary | ICD-10-CM | POA: Diagnosis not present

## 2016-12-12 DIAGNOSIS — Z4802 Encounter for removal of sutures: Secondary | ICD-10-CM | POA: Diagnosis present

## 2016-12-12 NOTE — ED Provider Notes (Signed)
[facility] DEPT Provider Note    By signing my name below, I, Bea Graff, attest that this documentation has been prepared under the direction and in the presence of Alyse Low, [place]. Electronically Signed: Bea Graff, ED Scribe. 12/12/16. 6:27 PM.    History   Chief Complaint Chief Complaint  Patient presents with  . Suture / Staple Removal    The history is provided by the patient and medical records. No language interpreter was used.    Darrell [name] is a 66 y.o. male who presents to the Emergency Department needing 6 sutures of the left forehead removed that were placed 6 days ago. He reports associated mild soreness of the wound. He has not taken anything for pain relief but has been applying warm compresses and triple antibiotic ointment. There are no modifying factors noted. He denies fever, chills, nausea, vomiting.   Past Medical History:  Diagnosis Date  . Chronic kidney disease    stage 3 GFR 30-59 ml/min   . Dysphagia as late effect of cerebrovascular disease    pts wife states pt has to eat soft foods   . GERD (gastroesophageal reflux disease)   . Glaucoma   . High cholesterol   . Hypertension   . Neuromuscular disorder ([place])    chronic inflammatory demyelinating polyneuropathy   . Stroke [facility])    2011 with residual deficit left sided weakness  . Tobacco dependence     Patient Active Problem List   Diagnosis Date Noted  . Constipation 09/12/2016  . Acute non-recurrent frontal sinusitis 09/12/2016  . Glaucoma 09/12/2016  . Liver hemangioma 08/14/2016  . Renal cyst 08/14/2016  . Renal mass, right 08/14/2016  . AKI (acute kidney injury) ([place]) 08/10/2016  . Elevated liver enzymes 08/10/2016  . CKD (chronic kidney disease) stage 3, GFR 30-59 ml/min 08/16/2015  . At high risk for falls 08/16/2015  . Subcutaneous nodules 08/16/2015  . Prediabetes 10/07/2013  . Inguinal hernia 03/25/2013  . History of CVA with residual deficit 03/25/2013  .  HTN (hypertension) 10/09/2012  . Tobacco abuse 10/09/2012    Past Surgical History:  Procedure Laterality Date  . EYE SURGERY    . INGUINAL HERNIA REPAIR Right 11/23/2014   Procedure: right inguinal hernia repair with mesh;  Surgeon: Armandina Gemma, MD;  Location: [facility] ORS;  Service: General;  Laterality: Right;  . INSERTION OF MESH N/A 11/23/2014   Procedure: INSERTION OF MESH;  Surgeon: Armandina Gemma, MD;  Location: [facility] ORS;  Service: General;  Laterality: N/A;  . SHOULDER SURGERY Bilateral 1988, 1998       Home Medications    Prior to Admission medications   Medication Sig Start Date End Date Taking? Authorizing Provider  amLODipine (NORVASC) 10 MG tablet Take 1 tablet (10 mg total) by mouth every morning. 05/25/16   Boykin Nearing, MD  aspirin EC 81 MG tablet Take 1 tablet (81 mg total) by mouth daily. 05/25/16   Josalyn Funches, MD  atorvastatin (LIPITOR) 40 MG tablet Take 1 tablet (40 mg total) by mouth daily. 05/25/16   Josalyn Funches, MD  brimonidine (ALPHAGAN P) 0.1 % SOLN Place 1 drop into both eyes 2 (two) times daily.    Historical Provider, MD  diclofenac sodium (VOLTAREN) 1 % GEL Apply 2 g topically 4 (four) times daily. 05/25/16   Josalyn Funches, MD  dorzolamide-timolol (COSOPT) 22.3-6.8 MG/ML ophthalmic solution Place 1 drop into both eyes 2 (two) times daily.    Historical Provider, MD  fluticasone (FLONASE) 50 MCG/ACT  nasal spray Place 2 sprays into both nostrils daily. 09/12/16   Josalyn Funches, MD  hydrALAZINE (APRESOLINE) 50 MG tablet Take 1 tablet (50 mg total) by mouth 3 (three) times daily. 08/10/16   Josalyn Funches, MD  latanoprost (XALATAN) 0.005 % ophthalmic solution Place 1 drop into both eyes at bedtime.    Historical Provider, MD  mirtazapine (REMERON) 7.5 MG tablet Take 1 tablet (7.5 mg total) by mouth at bedtime. 09/12/16   Boykin Nearing, MD  Multiple Vitamins-Iron (DAILY VITAMIN FORMULA+IRON) TABS Take 1 tablet by mouth daily.    Historical Provider, MD    polyethylene glycol powder (GLYCOLAX/MIRALAX) powder Take 17 g by mouth daily. 09/12/16   Josalyn Funches, MD  Psyllium (EQ DAILY FIBER PO) Take 1 tablet by mouth daily.    Historical Provider, MD    Family History Family History  Problem Relation Age of Onset  . Hypertension Mother   . Diabetes Sister   . Hypertension Sister     Social History Social History  Substance Use Topics  . Smoking status: Current Every Day Smoker    Packs/day: 1.00    Years: 40.00    Types: Cigarettes  . Smokeless tobacco: Never Used  . Alcohol use Yes     Comment: alcohol free for 1 year was drinking 1/2 pint per day      Allergies   Ace inhibitors; Doxycycline; Atacand hct [candesartan cilexetil-hctz]; and Shellfish allergy   Review of Systems Review of Systems  Constitutional: Negative for chills and fever.  Gastrointestinal: Negative for nausea and vomiting.  Skin: Positive for wound.  All other systems reviewed and are negative.    Physical Exam Updated Vital Signs BP (!) 162/104   Pulse 85   Temp 98.5 F (36.9 C) (Oral)   Resp 16   SpO2 100%   Physical Exam  Constitutional: He is oriented to person, place, and time. He appears well-developed and well-nourished.  HENT:  Head: Normocephalic.  Well healed laceration noted to the left forehead.  Neck: Normal range of motion.  Cardiovascular: Normal rate.   Pulmonary/Chest: Effort normal.  Musculoskeletal: Normal range of motion.  Neurological: He is alert and oriented to person, place, and time.  Skin: Skin is warm and dry.  Psychiatric: He has a normal mood and affect. His behavior is normal.  Nursing note and vitals reviewed.    ED Treatments / Results  DIAGNOSTIC STUDIES: Oxygen Saturation is 100% on RA, normal by my interpretation.   COORDINATION OF CARE: 6:22 PM- Will remove sutures. Pt verbalizes understanding and agrees to plan.  Medications - No data to display  Labs (all labs ordered are listed, but only  abnormal results are displayed) Labs Reviewed - No data to display  EKG  EKG Interpretation None       Radiology No results found.  Procedures .Suture Removal Date/Time: 12/12/2016 6:26 PM Performed by: Fransico Meadow Authorized by: Fransico Meadow   Consent:    Consent obtained:  Verbal   Consent given by:  Patient   Risks discussed:  Pain Location:    Location:  Head/neck   Head/neck location:  Forehead Procedure details:    Wound appearance:  No signs of infection   Number of sutures removed:  6 Post-procedure details:    Post-removal:  Antibiotic ointment applied   Patient tolerance of procedure:  Tolerated well, no immediate complications   (including critical care time)  Medications Ordered in ED Medications - No data to display  Initial Impression / Assessment and Plan / ED Course  I have reviewed the triage vital signs and the nursing notes.  Pertinent labs & imaging results that were available during my care of the patient were reviewed by me and considered in my medical decision making (see chart for details).     Pt to ER for staple/suture removal and wound check as above. Procedure tolerated well. Vitals normal, no signs of infection. Scar minimization & return precautions given at dc.     Final Clinical Impressions(s) / ED Diagnoses   Final diagnoses:  Visit for suture removal    New Prescriptions Discharge Medication List as of 12/12/2016  6:25 PM    An After Visit Summary was printed and given to the patient.  I personally performed the services in this documentation, which was scribed in my presence.  The recorded information has been reviewed and considered.   Ronnald Collum.    Hollace Kinnier [place], PA-C 12/12/16 1933    Merrily Pew, MD 12/12/16 2020

## 2016-12-12 NOTE — ED Triage Notes (Signed)
Here for stitch removal from left forehead

## 2016-12-12 NOTE — ED Notes (Signed)
Sutures intact left brow. Healing well.

## 2016-12-27 MED FILL — LATANOPROST 0.005% EYE DRP: 0.005 | 30 days supply | Qty: 3 | Fill #1

## 2016-12-27 MED FILL — AMLODIPINE BESYLATE 10 MG T: 10 | 30 days supply | Qty: 30 | Fill #4

## 2016-12-27 MED FILL — BRIMONIDINE 0.2% EYE DROP: 0.2 | 30 days supply | Qty: 10 | Fill #1

## 2016-12-27 MED FILL — ATORVASTATIN 40 MG TABLET: 40 | 30 days supply | Qty: 30 | Fill #4

## 2016-12-27 MED FILL — hydrALAZINE HCL 50 MG TABS: 50 | 30 days supply | Qty: 90 | Fill #2

## 2017-01-12 MED FILL — TORSEMIDE 20 MG TABLET: 20 | 30 days supply | Qty: 30 | Fill #0

## 2017-01-15 ENCOUNTER — Emergency Department (HOSPITAL_COMMUNITY): Payer: Medicare PPO

## 2017-01-15 ENCOUNTER — Encounter: Payer: Self-pay | Admitting: Family Medicine

## 2017-01-15 ENCOUNTER — Ambulatory Visit (HOSPITAL_BASED_OUTPATIENT_CLINIC_OR_DEPARTMENT_OTHER): Payer: Medicare PPO | Admitting: Family Medicine

## 2017-01-15 ENCOUNTER — Encounter (HOSPITAL_COMMUNITY): Payer: Self-pay | Admitting: *Deleted

## 2017-01-15 ENCOUNTER — Other Ambulatory Visit: Payer: Self-pay

## 2017-01-15 ENCOUNTER — Inpatient Hospital Stay (HOSPITAL_COMMUNITY)
Admission: EM | Admit: 2017-01-15 | Discharge: 2017-01-17 | DRG: 243 | Disposition: A | Discharge status: Home / Self Care | Payer: Medicare PPO | Attending: Internal Medicine | Admitting: Internal Medicine

## 2017-01-15 VITALS — BP 147/67 | HR 43 | Temp 98.1°F | Ht 76.0 in | Wt 172.4 lb

## 2017-01-15 DIAGNOSIS — Z95818 Presence of other cardiac implants and grafts: Secondary | ICD-10-CM

## 2017-01-15 DIAGNOSIS — I441 Atrioventricular block, second degree: Principal | ICD-10-CM | POA: Diagnosis present

## 2017-01-15 DIAGNOSIS — E7800 Pure hypercholesterolemia, unspecified: Secondary | ICD-10-CM

## 2017-01-15 DIAGNOSIS — E785 Hyperlipidemia, unspecified: Secondary | ICD-10-CM

## 2017-01-15 DIAGNOSIS — Z79899 Other long term (current) drug therapy: Secondary | ICD-10-CM

## 2017-01-15 DIAGNOSIS — I459 Conduction disorder, unspecified: Secondary | ICD-10-CM | POA: Diagnosis present

## 2017-01-15 DIAGNOSIS — I129 Hypertensive chronic kidney disease with stage 1 through stage 4 chronic kidney disease, or unspecified chronic kidney disease: Secondary | ICD-10-CM | POA: Diagnosis present

## 2017-01-15 DIAGNOSIS — F17210 Nicotine dependence, cigarettes, uncomplicated: Secondary | ICD-10-CM

## 2017-01-15 DIAGNOSIS — Z91013 Allergy to seafood: Secondary | ICD-10-CM

## 2017-01-15 DIAGNOSIS — R1310 Dysphagia, unspecified: Secondary | ICD-10-CM | POA: Diagnosis present

## 2017-01-15 DIAGNOSIS — R05 Cough: Secondary | ICD-10-CM | POA: Diagnosis present

## 2017-01-15 DIAGNOSIS — I69354 Hemiplegia and hemiparesis following cerebral infarction affecting left non-dominant side: Secondary | ICD-10-CM

## 2017-01-15 DIAGNOSIS — Z9181 History of falling: Secondary | ICD-10-CM

## 2017-01-15 DIAGNOSIS — N183 Chronic kidney disease, stage 3 unspecified: Secondary | ICD-10-CM | POA: Diagnosis present

## 2017-01-15 DIAGNOSIS — Z7951 Long term (current) use of inhaled steroids: Secondary | ICD-10-CM

## 2017-01-15 DIAGNOSIS — I10 Essential (primary) hypertension: Secondary | ICD-10-CM | POA: Diagnosis not present

## 2017-01-15 DIAGNOSIS — Z720 Tobacco use: Secondary | ICD-10-CM | POA: Diagnosis present

## 2017-01-15 DIAGNOSIS — H409 Unspecified glaucoma: Secondary | ICD-10-CM | POA: Diagnosis present

## 2017-01-15 DIAGNOSIS — Z7982 Long term (current) use of aspirin: Secondary | ICD-10-CM

## 2017-01-15 DIAGNOSIS — B353 Tinea pedis: Secondary | ICD-10-CM | POA: Diagnosis present

## 2017-01-15 DIAGNOSIS — I499 Cardiac arrhythmia, unspecified: Secondary | ICD-10-CM | POA: Diagnosis present

## 2017-01-15 DIAGNOSIS — Z6821 Body mass index (BMI) 21.0-21.9, adult: Secondary | ICD-10-CM

## 2017-01-15 DIAGNOSIS — Z6820 Body mass index (BMI) 20.0-20.9, adult: Secondary | ICD-10-CM

## 2017-01-15 DIAGNOSIS — R001 Bradycardia, unspecified: Secondary | ICD-10-CM

## 2017-01-15 DIAGNOSIS — R634 Abnormal weight loss: Secondary | ICD-10-CM | POA: Diagnosis present

## 2017-01-15 DIAGNOSIS — I69391 Dysphagia following cerebral infarction: Secondary | ICD-10-CM

## 2017-01-15 DIAGNOSIS — Z888 Allergy status to other drugs, medicaments and biological substances status: Secondary | ICD-10-CM

## 2017-01-15 DIAGNOSIS — I447 Left bundle-branch block, unspecified: Secondary | ICD-10-CM | POA: Diagnosis present

## 2017-01-15 DIAGNOSIS — N184 Chronic kidney disease, stage 4 (severe): Secondary | ICD-10-CM | POA: Diagnosis present

## 2017-01-15 DIAGNOSIS — Z833 Family history of diabetes mellitus: Secondary | ICD-10-CM

## 2017-01-15 DIAGNOSIS — Z881 Allergy status to other antibiotic agents status: Secondary | ICD-10-CM

## 2017-01-15 DIAGNOSIS — N1832 Chronic kidney disease, stage 3b: Secondary | ICD-10-CM | POA: Diagnosis present

## 2017-01-15 DIAGNOSIS — K219 Gastro-esophageal reflux disease without esophagitis: Secondary | ICD-10-CM | POA: Diagnosis present

## 2017-01-15 DIAGNOSIS — G6181 Chronic inflammatory demyelinating polyneuritis: Secondary | ICD-10-CM | POA: Diagnosis present

## 2017-01-15 DIAGNOSIS — Z8249 Family history of ischemic heart disease and other diseases of the circulatory system: Secondary | ICD-10-CM

## 2017-01-15 LAB — CBC
HEMATOCRIT: 43.7 % (ref 39.0–52.0)
HEMOGLOBIN: 14.1 g/dL (ref 13.0–17.0)
MCH: 28.3 pg (ref 26.0–34.0)
MCHC: 32.3 g/dL (ref 30.0–36.0)
MCV: 87.6 fL (ref 78.0–100.0)
Platelets: 293 10*3/uL (ref 150–400)
RBC: 4.99 MIL/uL (ref 4.22–5.81)
RDW: 15.1 % (ref 11.5–15.5)
WBC: 9.8 10*3/uL (ref 4.0–10.5)

## 2017-01-15 LAB — BASIC METABOLIC PANEL
ANION GAP: 8 (ref 5–15)
BUN: 26 mg/dL — AB (ref 6–20)
CO2: 29 mmol/L (ref 22–32)
Calcium: 10.7 mg/dL — ABNORMAL HIGH (ref 8.9–10.3)
Chloride: 104 mmol/L (ref 101–111)
Creatinine, Ser: 1.99 mg/dL — ABNORMAL HIGH (ref 0.61–1.24)
GFR, EST AFRICAN AMERICAN: 39 mL/min — AB (ref >60)
GFR, EST NON AFRICAN AMERICAN: 34 mL/min — AB (ref >60)
Glucose, Bld: 102 mg/dL — ABNORMAL HIGH (ref 65–99)
POTASSIUM: 5.1 mmol/L (ref 3.5–5.1)
SODIUM: 141 mmol/L (ref 135–145)

## 2017-01-15 LAB — I-STAT TROPONIN, ED: Troponin i, poc: 0.01 ng/mL (ref 0.00–0.08)

## 2017-01-15 MED ORDER — SODIUM CHLORIDE 0.9 % IV SOLN
INTRAVENOUS | Status: DC
  Administered 2017-01-15 – 2017-01-16 (×2): via INTRAVENOUS

## 2017-01-15 MED ORDER — FLUTICASONE PROPIONATE 50 MCG/ACT NA SUSP
2.0000 | Freq: Every day | NASAL | Status: DC
  Administered 2017-01-16 – 2017-01-17 (×2): 2 via NASAL
  Filled 2017-01-15: qty 16

## 2017-01-15 MED ORDER — TORSEMIDE 20 MG PO TABS
20.0000 mg | ORAL_TABLET | Freq: Every day | ORAL | Status: DC
  Administered 2017-01-16 – 2017-01-17 (×2): 20 mg via ORAL
  Filled 2017-01-15 (×2): qty 1

## 2017-01-15 MED ORDER — KETOCONAZOLE 2 % EX CREA: 1.0000 "application " | TOPICAL_CREAM | Freq: Two times a day (BID) | CUTANEOUS | 0 refills | Status: DC

## 2017-01-15 MED ORDER — NICOTINE 21 MG/24HR TD PT24
21.0000 mg | MEDICATED_PATCH | Freq: Every day | TRANSDERMAL | Status: DC
  Administered 2017-01-15 – 2017-01-17 (×3): 21 mg via TRANSDERMAL
  Filled 2017-01-15 (×3): qty 1

## 2017-01-15 MED ORDER — ACETAMINOPHEN 650 MG RE SUPP: 650.0000 mg | Freq: Four times a day (QID) | RECTAL | Status: DC | PRN

## 2017-01-15 MED ORDER — ACETAMINOPHEN 325 MG PO TABS
650.0000 mg | ORAL_TABLET | Freq: Four times a day (QID) | ORAL | Status: DC | PRN
  Administered 2017-01-17 (×2): 650 mg via ORAL
  Filled 2017-01-15 (×2): qty 2

## 2017-01-15 MED ORDER — DORZOLAMIDE HCL-TIMOLOL MAL 2-0.5 % OP SOLN
1.0000 [drp] | Freq: Two times a day (BID) | OPHTHALMIC | Status: DC
  Administered 2017-01-15 – 2017-01-17 (×5): 1 [drp] via OPHTHALMIC
  Filled 2017-01-15: qty 10

## 2017-01-15 MED ORDER — BRIMONIDINE TARTRATE 0.15 % OP SOLN
1.0000 [drp] | Freq: Two times a day (BID) | OPHTHALMIC | Status: DC
  Administered 2017-01-15 – 2017-01-17 (×4): 1 [drp] via OPHTHALMIC
  Filled 2017-01-15: qty 5

## 2017-01-15 MED ORDER — ONDANSETRON HCL 4 MG/2ML IJ SOLN
4.0000 mg | Freq: Four times a day (QID) | INTRAMUSCULAR | Status: DC | PRN
Start: 2017-01-15 — End: 2017-01-16

## 2017-01-15 MED ORDER — AMLODIPINE BESYLATE 10 MG PO TABS
10.0000 mg | ORAL_TABLET | Freq: Every morning | ORAL | Status: DC
Start: 2017-01-16 — End: 2017-01-17
  Administered 2017-01-16 – 2017-01-17 (×2): 10 mg via ORAL
  Filled 2017-01-15: qty 1
  Filled 2017-01-15: qty 2

## 2017-01-15 MED ORDER — IPRATROPIUM-ALBUTEROL 0.5-2.5 (3) MG/3ML IN SOLN: 3.0000 mL | RESPIRATORY_TRACT | Status: DC | PRN

## 2017-01-15 MED ORDER — ONDANSETRON HCL 4 MG PO TABS: 4.0000 mg | ORAL_TABLET | Freq: Four times a day (QID) | ORAL | Status: DC | PRN

## 2017-01-15 MED ORDER — ATORVASTATIN CALCIUM 40 MG PO TABS
40.0000 mg | ORAL_TABLET | Freq: Every day | ORAL | Status: DC
  Administered 2017-01-16 – 2017-01-17 (×2): 40 mg via ORAL
  Filled 2017-01-15 (×2): qty 1

## 2017-01-15 MED ORDER — ENOXAPARIN SODIUM 40 MG/0.4ML ~~LOC~~ SOLN: 40.0000 mg | SUBCUTANEOUS | Status: DC

## 2017-01-15 MED ORDER — HYDRALAZINE HCL 50 MG PO TABS
50.0000 mg | ORAL_TABLET | Freq: Three times a day (TID) | ORAL | Status: DC
  Administered 2017-01-16 – 2017-01-17 (×4): 50 mg via ORAL
  Filled 2017-01-15: qty 1
  Filled 2017-01-15: qty 2
  Filled 2017-01-15: qty 1
  Filled 2017-01-15: qty 2

## 2017-01-15 MED ORDER — LATANOPROST 0.005 % OP SOLN
1.0000 [drp] | Freq: Every day | OPHTHALMIC | Status: DC
  Administered 2017-01-16: 1 [drp] via OPHTHALMIC
  Filled 2017-01-15: qty 2.5

## 2017-01-15 MED ORDER — ASPIRIN EC 81 MG PO TBEC
81.0000 mg | DELAYED_RELEASE_TABLET | Freq: Every day | ORAL | Status: DC
  Administered 2017-01-16 – 2017-01-17 (×2): 81 mg via ORAL
  Filled 2017-01-15 (×2): qty 1

## 2017-01-15 NOTE — ED Triage Notes (Signed)
Pt reports that he was sent here from his MD for evaluation of HR in the 40's. Pts family had noticed his HR low on Friday. Denies any complaints.

## 2017-01-15 NOTE — Progress Notes (Signed)
Subjective:  Patient ID: Darrell [name], male    DOB: [birth year]  Age: 66 y.o. MRN: [id number]  CC: Hypertension and Weight Check   HPI Darrell [name] has hx of CVA (09/2012), HTN, CKD stage 3 he presents for   1.  HTN: taking hydralazine 50 mg TID, also amlodpine  10 mg daily. He was started on torsemide 20 mg daily by his nephrologist 3 days ago for BP 180/90, HR 68. Potassium 4.5. Creatinine 1.95.  Hgb 13.3.  His wife checks his blood pressure and pulse every other day. She noticed his heart rate was 44 noted  2 days ago. No check done yesterday. Both check of heart rate today was 44.    He is not taking ace inhibitor due to AKI in 07/2016. He is not taking nitrates or beta blocker. He takes aspirin 81 mg daily He denies HA, chest pain, shortness of breath, leg swelling, nausea or emesis.   He has noted weight loss and weakness in legs over the last month. Denies blood in stool. Admits to decreased appetite.    Social History  Substance Use Topics  . Smoking status: Current Every Day Smoker    Packs/day: 1.00    Years: 40.00    Types: Cigarettes  . Smokeless tobacco: Never Used  . Alcohol use Yes     Comment: alcohol free for 1 year was drinking 1/2 pint per day    Outpatient Medications Prior to Visit  Medication Sig Dispense Refill  . amLODipine (NORVASC) 10 MG tablet Take 1 tablet (10 mg total) by mouth every morning. 90 tablet 2  . aspirin EC 81 MG tablet Take 1 tablet (81 mg total) by mouth daily. 90 tablet 3  . atorvastatin (LIPITOR) 40 MG tablet Take 1 tablet (40 mg total) by mouth daily. 90 tablet 3  . brimonidine (ALPHAGAN P) 0.1 % SOLN Place 1 drop into both eyes 2 (two) times daily.    . diclofenac sodium (VOLTAREN) 1 % GEL Apply 2 g topically 4 (four) times daily. 100 g 5  . dorzolamide-timolol (COSOPT) 22.3-6.8 MG/ML ophthalmic solution Place 1 drop into both eyes 2 (two) times daily.    . fluticasone (FLONASE) 50 MCG/ACT nasal spray Place 2 sprays into both nostrils  daily. 16 g 6  . hydrALAZINE (APRESOLINE) 50 MG tablet Take 1 tablet (50 mg total) by mouth 3 (three) times daily. 90 tablet 5  . latanoprost (XALATAN) 0.005 % ophthalmic solution Place 1 drop into both eyes at bedtime.    . mirtazapine (REMERON) 7.5 MG tablet Take 1 tablet (7.5 mg total) by mouth at bedtime. 30 tablet 2  . Multiple Vitamins-Iron (DAILY VITAMIN FORMULA+IRON) TABS Take 1 tablet by mouth daily.    . polyethylene glycol powder (GLYCOLAX/MIRALAX) powder Take 17 g by mouth daily. 3350 g 1  . Psyllium (EQ DAILY FIBER PO) Take 1 tablet by mouth daily.     No facility-administered medications prior to visit.     ROS Review of Systems  Constitutional: Positive for appetite change and chills. Negative for fatigue, fever and unexpected weight change.  HENT: Negative for congestion, rhinorrhea, sore throat, trouble swallowing and voice change.   Eyes: Positive for visual disturbance (blind in R eye ).  Respiratory: Negative for cough and shortness of breath.   Cardiovascular: Negative for chest pain, palpitations and leg swelling.  Gastrointestinal: Negative for abdominal pain, blood in stool, constipation, diarrhea, nausea and vomiting.  Endocrine: Negative for polydipsia, polyphagia and polyuria.  Musculoskeletal: Positive for arthralgias. Negative for back pain, gait problem, joint swelling, myalgias and neck pain.  Skin: Negative for rash.  Allergic/Immunologic: Negative for immunocompromised state.  Neurological: Positive for speech difficulty and weakness (in lower extermities, L >R ). Negative for light-headedness and headaches.  Hematological: Negative for adenopathy. Does not bruise/bleed easily.  Psychiatric/Behavioral: Negative for dysphoric mood, sleep disturbance and suicidal ideas. The patient is not nervous/anxious.     Objective:  BP (!) 147/67   Pulse (!) 43   Temp 98.1 F (36.7 C) (Oral)   Ht 6' 4"  (1.93 m)   Wt 172 lb 6.4 oz (78.2 kg)   SpO2 100%   BMI  20.99 kg/m   BP/Weight 01/15/2017 12/12/2016 4/84/0397  Systolic BP 953 692 230  Diastolic BP 67 097 949  Wt. (Lbs) 172.4 - 194  BMI 20.99 - 23.93   Wt Readings from Last 3 Encounters:  01/15/17 172 lb 6.4 oz (78.2 kg)  12/06/16 194 lb (88 kg)  09/12/16 170 lb (77.1 kg)    Physical Exam  Constitutional: He appears well-developed and well-nourished. No distress.  Thin adult male  Sitting in wheelchair   HENT:  Head: Normocephalic and atraumatic.  Eyes: Conjunctivae and EOM are normal. Right pupil is not reactive. Right pupil is round. Left pupil is round and reactive.  Neck: Normal range of motion. Neck supple.    Cardiovascular: Normal rate, regular rhythm, normal heart sounds and intact distal pulses.   Pulmonary/Chest: Effort normal and breath sounds normal.  Musculoskeletal: He exhibits no edema.  Neurological: He is alert. No cranial nerve deficit. Gait abnormal.  Decreased strength in legs   Skin: Skin is warm and dry. No rash noted. No erythema.     Psychiatric: He has a normal mood and affect.   EKG: sinus bradycardia, HR 40, widened QRS. New LBB.  Marked change from 08/2016 EKG    Assessment & Plan:  Darrell [name] was seen today for hypertension and weight check.  Diagnoses and all orders for this visit:  Bradycardia -     EKG 12-Lead -     Ambulatory referral to Cardiology -     Cancel: CMP14+EGFR -     Cancel: CBC  Tinea pedis of right foot -     ketoconazole (NIZORAL) 2 % cream; Apply 1 application topically 2 (two) times daily.   There are no diagnoses linked to this encounter.  No orders of the defined types were placed in this encounter.   Follow-up: Return in about 2 weeks (around 01/29/2017) for bradycardia .   Boykin Nearing MD

## 2017-01-15 NOTE — Assessment & Plan Note (Signed)
A: new sinus bradycardia in setting of start torsemide 20 mg daily, no other new medications Bradycardia with widening QRS and new LBB on EKG  Plan: Patient sent to ED for stat labs- I stat troponin, CBC, CMP Cardiology referral placed

## 2017-01-15 NOTE — Patient Instructions (Signed)
Darrell [name] was seen today for hypertension and weight check.  Diagnoses and all orders for this visit:  Bradycardia -     EKG 12-Lead -     Ambulatory referral to Cardiology -     CMP14+EGFR -     CBC    Please go directly to ED for repeat labbs- CBC, CMP, Istat troponin  Due to new bradycardia (slow heart rate and EKG changes) Report called to [facility] charge nurse    f/u with me in 2 weeks for bradycardia and EKGs changes   Dr. Adrian Blackwater

## 2017-01-15 NOTE — H&P (Signed)
History and Physical    Darrell [name] YME:[id number] DOB: [birth year] DOA: 01/15/2017  Referring MD/NP/PA: Dr. Johnney Killian PCP: Minerva Ends, MD   Patient coming from: PCP office  Chief Complaint: Slow heart rates  HPI: Darrell [name] is a 66 y.o. male with medical history significant of  HTN, HLD, CKD stage III, tobacco abuse, and hypercalcemia; who presents from PCP office for heart rates in the 78s. Patient wife notes that since yesterday he is intermittently had heart rates in the 40s when checking blood pressures. He had a routine follow-up appointment with his primary care provider today and was noted to have heart rates in the 40s for which her EKG showed a 2:1 AV block. Of note patient had fallen 3 weeks ago after feeling dizzy/lightheaded and off balance. He suffered a contusion to his head requiring stitches. Other associated symptoms include note of chronic productive cough with white sputum over the last several weeks and some weight loss of approximately 5-10 pounds last few weeks. Otherwise denies any shortness of breath, chest pain, palpitations, leg swelling, nausea, vomiting, or diarrhea. He reports continued to smoke on a regular basis.    ED Course: Upon admission into the emergency department patient was seen to be afebrile heart rates 40-73, respirations 11-18, and all other vitals within normal limits. Labs revealed BUN 26, creatinine 1.99, calcium 10.7 EKG on admission again showing 2-1 AV block. Dr. Al Pimple of cardiology was consulted and recommended monitoring overnight.  Review of Systems: As per HPI otherwise 10 point review of systems negative.   Past Medical History:  Diagnosis Date  . Chronic kidney disease    stage 3 GFR 30-59 ml/min   . Dysphagia as late effect of cerebrovascular disease    pts wife states pt has to eat soft foods   . GERD (gastroesophageal reflux disease)   . Glaucoma   . High cholesterol   . Hypertension   . Neuromuscular  disorder ([place])    chronic inflammatory demyelinating polyneuropathy   . Stroke [facility])    2011 with residual deficit left sided weakness  . Tobacco dependence     Past Surgical History:  Procedure Laterality Date  . EYE SURGERY    . INGUINAL HERNIA REPAIR Right 11/23/2014   Procedure: right inguinal hernia repair with mesh;  Surgeon: Armandina Gemma, MD;  Location: [facility]S;  Service: General;  Laterality: Right;  . INSERTION OF MESH N/A 11/23/2014   Procedure: INSERTION OF MESH;  Surgeon: Armandina Gemma, MD;  Location: [facility];  Service: General;  Laterality: N/A;  . SHOULDER SURGERY Bilateral [place]     reports that he has been smoking Cigarettes.  He has a 40.00 pack-year smoking history. He has never used smokeless tobacco. He reports that he drinks alcohol. He reports that he does not use drugs.  Allergies  Allergen Reactions  . Ace Inhibitors Other (See Comments)    Hyperkalemia   . Doxycycline Other (See Comments)    Hiccups, cough, nausea and emesis, elevated liver enzymes, elevated eosinophils, SOB concerning for early DRESS syndrome   . Atacand Hct [Candesartan Cilexetil-Hctz] Hives  . Shellfish Allergy Hives    Family History  Problem Relation Age of Onset  . Hypertension Mother   . Diabetes Sister   . Hypertension Sister     Prior to Admission medications   Medication Sig Start Date End Date Taking? Authorizing Provider  amLODipine (NORVASC) 10 MG tablet Take 1 tablet (10 mg total) by mouth every  morning. 05/25/16   Boykin Nearing, MD  aspirin EC 81 MG tablet Take 1 tablet (81 mg total) by mouth daily. 05/25/16   Josalyn Funches, MD  atorvastatin (LIPITOR) 40 MG tablet Take 1 tablet (40 mg total) by mouth daily. 05/25/16   Josalyn Funches, MD  brimonidine (ALPHAGAN P) 0.1 % SOLN Place 1 drop into both eyes 2 (two) times daily.    Historical Provider, MD  diclofenac sodium (VOLTAREN) 1 % GEL Apply 2 g topically 4 (four) times daily. 05/25/16   Josalyn Funches, MD    dorzolamide-timolol (COSOPT) 22.3-6.8 MG/ML ophthalmic solution Place 1 drop into both eyes 2 (two) times daily.    Historical Provider, MD  fluticasone (FLONASE) 50 MCG/ACT nasal spray Place 2 sprays into both nostrils daily. 09/12/16   Josalyn Funches, MD  hydrALAZINE (APRESOLINE) 50 MG tablet Take 1 tablet (50 mg total) by mouth 3 (three) times daily. 08/10/16   Josalyn Funches, MD  ketoconazole (NIZORAL) 2 % cream Apply 1 application topically 2 (two) times daily. 01/15/17   Josalyn Funches, MD  latanoprost (XALATAN) 0.005 % ophthalmic solution Place 1 drop into both eyes at bedtime.    Historical Provider, MD  mirtazapine (REMERON) 7.5 MG tablet Take 1 tablet (7.5 mg total) by mouth at bedtime. 09/12/16   Boykin Nearing, MD  Multiple Vitamins-Iron (DAILY VITAMIN FORMULA+IRON) TABS Take 1 tablet by mouth daily.    Historical Provider, MD  polyethylene glycol powder (GLYCOLAX/MIRALAX) powder Take 17 g by mouth daily. 09/12/16   Josalyn Funches, MD  Psyllium (EQ DAILY FIBER PO) Take 1 tablet by mouth daily.    Historical Provider, MD  torsemide (DEMADEX) 20 MG tablet Take 20 mg by mouth daily.    Historical Provider, MD    Physical Exam:    Constitutional: NAD, calm, comfortable Vitals:   01/15/17 1852 01/15/17 1900 01/15/17 1930 01/15/17 2000  BP:  140/75 136/71 (!) 145/70  Pulse: (!) 40 (!) 41 (!) 43 (!) 44  Resp: 13 11 15 15   Temp:      TempSrc:      SpO2: 100% 99% 98% 98%   Eyes: Blind in right eye and pupil nonreactive, lids and conjunctivae normal ENMT: Mucous membranes are moist. Posterior pharynx clear of any exudate or lesions.Normal dentition.  Neck: normal, supple, no masses, no thyromegaly Respiratory: clear to auscultation bilaterally, no wheezing, no crackles. Normal respiratory effort. No accessory muscle use.  Cardiovascular: Bradycardic, no murmurs / rubs / gallops. No extremity edema. 2+ pedal pulses. No carotid bruits.  Abdomen: no tenderness, no masses palpated.  No hepatosplenomegaly. Bowel sounds positive.  Musculoskeletal: no clubbing / cyanosis. No joint deformity upper and lower extremities. Good ROM, no contractures. Normal muscle tone.  Skin: no rashes, lesions, ulcers. No induration Neurologic: CN 2-12 grossly intact. Sensation intact, DTR normal. Strength 5/5 in all 4.  Psychiatric: Normal judgment and insight. Alert and oriented x 3. Normal mood.     Labs on Admission: I have personally reviewed following labs and imaging studies  CBC:  Recent Labs Lab 01/15/17 1704  WBC 9.8  HGB 14.1  HCT 43.7  MCV 87.6  PLT 767   Basic Metabolic Panel:  Recent Labs Lab 01/15/17 1704  NA 141  K 5.1  CL 104  CO2 29  GLUCOSE 102*  BUN 26*  CREATININE 1.99*  CALCIUM 10.7*   GFR: Estimated Creatinine Clearance: 41.5 mL/min (A) (by C-G formula based on SCr of 1.99 mg/dL (H)). Liver Function Tests: No results for input(s):  AST, ALT, ALKPHOS, BILITOT, PROT, ALBUMIN in the last 168 hours. No results for input(s): LIPASE, AMYLASE in the last 168 hours. No results for input(s): AMMONIA in the last 168 hours. Coagulation Profile: No results for input(s): INR, PROTIME in the last 168 hours. Cardiac Enzymes: No results for input(s): CKTOTAL, CKMB, CKMBINDEX, TROPONINI in the last 168 hours. BNP (last 3 results) No results for input(s): PROBNP in the last 8760 hours. HbA1C: No results for input(s): HGBA1C in the last 72 hours. CBG: No results for input(s): GLUCAP in the last 168 hours. Lipid Profile: No results for input(s): CHOL, HDL, LDLCALC, TRIG, CHOLHDL, LDLDIRECT in the last 72 hours. Thyroid Function Tests: No results for input(s): TSH, T4TOTAL, FREET4, T3FREE, THYROIDAB in the last 72 hours. Anemia Panel: No results for input(s): VITAMINB12, FOLATE, FERRITIN, TIBC, IRON, RETICCTPCT in the last 72 hours. Urine analysis:    Component Value Date/Time   COLORURINE YELLOW 10/07/2013 1331   APPEARANCEUR CLEAR 10/07/2013 1331    LABSPEC 1.015 08/04/2016 1517   PHURINE 5.5 08/04/2016 1517   GLUCOSEU NEGATIVE 08/04/2016 1517   HGBUR NEGATIVE 08/04/2016 1517   BILIRUBINUR negative 08/10/2016 [place] 08/04/2016 1517   PROTEINUR 30 08/10/2016 1658   PROTEINUR 30 (A) 08/04/2016 1517   UROBILINOGEN 1.0 08/10/2016 1658   UROBILINOGEN 4.0 (H) 08/04/2016 1517   NITRITE negative 08/10/2016 1658   NITRITE NEGATIVE 08/04/2016 1517   LEUKOCYTESUR small (1+) (A) 08/10/2016 1658   Sepsis Labs: No results found for this or any previous visit (from the past 240 hour(s)).   Radiological Exams on Admission: Dg Chest 2 View  Result Date: 01/15/2017 CLINICAL DATA:  Acute onset of bradycardia.  Initial encounter. EXAM: CHEST  2 VIEW COMPARISON:  Chest radiograph performed 08/04/2016, and CT of the chest performed 08/11/2016 FINDINGS: The lungs are well-aerated and clear. There is no evidence of focal opacification, pleural effusion or pneumothorax. The heart is normal in size; the mediastinal contour is within normal limits. No acute osseous abnormalities are seen. IMPRESSION: No acute cardiopulmonary process seen. Electronically Signed   By: Garald Balding M.D.   On: 01/15/2017 19:51    EKG: Independently reviewed: 2-1 heart block  Assessment/Plan Heart block: Acute. 2:1 A-V heart block that is unclear if type I or type II at this time. Cardiology formally consulted. Note previous fall 3 weeks ago may have been related to patient's heart. Patient may warrant placement of pacemaker depending on type of heart block determined. - Admit to telemetry bed - Follow-up telemetry overnight - Appreciate consultative services of cardiology, will follow further recommendations   Essential hypertension - Continue amlodipine and hydralazine  CKD stage 3: Stable. Baseline creatinine ranges from 1.45-2.37 looking and previous lab values continue to monitor. - continue to monitor   Hypercalcemia: Acute on chronic. Mild  elevation in calcium levels at 10.7.  - IV fluids  - Check calcium levels in a.m.  Chronic cough and Weight loss: Recent weight loss of 5-10 pounds. Patient with long smoking history and associated symptoms of hypercalcemia given concern for possibility of malignancy. - Check TSH, ESR  - May warrant further investigation with CT scan of chest   Glaucoma - Continue home eyedrops  Tobacco abuse: Patient reports still continued to smoke on a regular basis. - Counseled on the need for cessation of tobacco abuse - Nicotine patch offered  DVT prophylaxis: lovenox  Code Status: Full  Family Communication:  Discussed plan of care with the patient and family present  at bedside Disposition Plan: Likely discharge home once medically stable.  Consults called: Cardiology  Admission status: observation  Norval Morton MD [facility] Pager [phone]  If 7PM-7AM, please contact night-coverage [email or web address] Password [facility]  01/15/2017, 9:32 PM

## 2017-01-15 NOTE — Consult Note (Signed)
CARDIOLOGY CONSULT NOTE   Referring Physician: Dr. Johnney Killian Primary Physician: [facility] (inpatient), Dr. Adrian Blackwater, PCP Primary Cardiologist: none Reason for Consultation: bradycardia   HPI: Giovannie Scerbo is a 66 yo man with PMH CVA, HTN, HLD, CKD stage 3, tobacco abuse who is seen at the request of Dr. Johnney Killian for consultation of bradycardia. The patient notes that he has had intermittent slow heart rates for some time; estimated to possibly be as long as two weeks but captured on his home BP monitor for two days. He denies loss of consciousness, lightheadedness/presyncope, or "hard" heartbeats to me, but does endorse a mechanical fall several weeks ago. He was not concerned about the slow rate until he saw his PCP, at which time an ECG showed 2:1 AV block. He was directed to the ER, where ECG and telemetry continued to show 2:1 AV block.  He denies chest pain, shortness of breath, LE edema, PND, orthopnea. No fevers/chills. Does endorse chronic cough with white sputum. Notes poor appetite, several lb weight loss. Only new medication was torsemide started last week, started by his nephrologist for elevated BP.  Review of Systems:     Cardiac Review of Systems: {Y] = yes [ ]  = no  Chest Pain [    ]  Resting SOB [   ] Exertional SOB  [  ]  Orthopnea [  ]   Pedal Edema [   ]    Palpitations [  ] Syncope  [  ]   Presyncope [   ]  General Review of Systems: [Y] = yes [  ]=no Constitional: recent weight change [ Y ]; anorexia [Y  ]; fatigue [  ]; nausea [  ]; night sweats [  ]; fever [  ]; or chills [  ];  Eyes : blurred vision [  ]; diplopia [   ]; vision changes [  ];  Amaurosis fugax[  ]; Resp: cough [ Y ];  wheezing[  ];  hemoptysis[  ];  PND [  ];  GI:  gallstones[  ], vomiting[  ];  dysphagia[  ]; melena[  ];  hematochezia [  ]; heartburn[  ];   GU: kidney stones [  ]; hematuria[  ];   dysuria [  ];  nocturia[  ]; incontinence [  ];             Skin: rash, swelling[  ];, hair  loss[  ];  peripheral edema[  ];  or itching[  ]; Musculosketetal: myalgias[  ];  joint swelling[  ];  joint erythema[  ];  joint pain[  ];  back pain[  ];  Heme/Lymph: bruising[  ];  bleeding[  ];  anemia[  ];  Neuro: TIA[  ];  headaches[  ];  stroke[  ];  vertigo[  ];  seizures[  ];   paresthesias[  ];  difficulty walking[  ];  Psych:depression[  ]; anxiety[  ];  Endocrine: diabetes[  ];  thyroid dysfunction[  ];  Other:  Past Medical History:  Diagnosis Date  . Chronic kidney disease    stage 3 GFR 30-59 ml/min   . Dysphagia as late effect of cerebrovascular disease    pts wife states pt has to eat soft foods   . GERD (gastroesophageal reflux disease)   . Glaucoma   . High cholesterol   . Hypertension   . Neuromuscular disorder ([place])    chronic inflammatory demyelinating polyneuropathy   . Stroke [facility])  2011 with residual deficit left sided weakness  . Tobacco dependence     Medications Prior to Admission  Medication Sig Dispense Refill  . amLODipine (NORVASC) 10 MG tablet Take 1 tablet (10 mg total) by mouth every morning. 90 tablet 2  . aspirin EC 81 MG tablet Take 1 tablet (81 mg total) by mouth daily. 90 tablet 3  . atorvastatin (LIPITOR) 40 MG tablet Take 1 tablet (40 mg total) by mouth daily. 90 tablet 3  . brimonidine (ALPHAGAN P) 0.1 % SOLN Place 1 drop into both eyes 2 (two) times daily.    . dorzolamide-timolol (COSOPT) 22.3-6.8 MG/ML ophthalmic solution Place 1 drop into both eyes 2 (two) times daily.    . fluticasone (FLONASE) 50 MCG/ACT nasal spray Place 2 sprays into both nostrils daily. 16 g 6  . hydrALAZINE (APRESOLINE) 50 MG tablet Take 1 tablet (50 mg total) by mouth 3 (three) times daily. 90 tablet 5  . latanoprost (XALATAN) 0.005 % ophthalmic solution Place 1 drop into both eyes at bedtime.    . Multiple Vitamins-Iron (DAILY VITAMIN FORMULA+IRON) TABS Take 1 tablet by mouth daily.    . Psyllium (EQ DAILY FIBER PO) Take 1 tablet by mouth daily.    Marland Kitchen  torsemide (DEMADEX) 20 MG tablet Take 20 mg by mouth daily.    . diclofenac sodium (VOLTAREN) 1 % GEL Apply 2 g topically 4 (four) times daily. (Patient not taking: Reported on 01/15/2017) 100 g 5  . ketoconazole (NIZORAL) 2 % cream Apply 1 application topically 2 (two) times daily. 30 g 0  . mirtazapine (REMERON) 7.5 MG tablet Take 1 tablet (7.5 mg total) by mouth at bedtime. (Patient not taking: Reported on 01/15/2017) 30 tablet 2  . polyethylene glycol powder (GLYCOLAX/MIRALAX) powder Take 17 g by mouth daily. (Patient not taking: Reported on 01/15/2017) 3350 g 1     . [START ON 01/16/2017] amLODipine  10 mg Oral q morning - 10a  . [START ON 01/16/2017] aspirin EC  81 mg Oral Daily  . [START ON 01/16/2017] atorvastatin  40 mg Oral Daily  . brimonidine  1 drop Both Eyes BID  . dorzolamide-timolol  1 drop Both Eyes BID  . [START ON 01/16/2017] enoxaparin (LOVENOX) injection  40 mg Subcutaneous Q24H  . [START ON 01/16/2017] fluticasone  2 spray Each Nare Daily  . hydrALAZINE  50 mg Oral TID  . latanoprost  1 drop Both Eyes QHS  . nicotine  21 mg Transdermal Daily  . [START ON 01/16/2017] torsemide  20 mg Oral Daily    Infusions: . sodium chloride 75 mL/hr at 01/15/17 2238    Allergies  Allergen Reactions  . Ace Inhibitors Other (See Comments)    Hyperkalemia   . Doxycycline Other (See Comments)    Hiccups, cough, nausea and emesis, elevated liver enzymes, elevated eosinophils, SOB concerning for early DRESS syndrome   . Atacand Hct [Candesartan Cilexetil-Hctz] Hives  . Shellfish Allergy Hives    Social History   Social History  . Marital status: Married    Spouse name: [name]  . Number of children: 1  . Years of education: college   Occupational History  . retired    Social History Main Topics  . Smoking status: Current Every Day Smoker    Packs/day: 1.00    Years: 40.00    Types: Cigarettes  . Smokeless tobacco: Never Used  . Alcohol use Yes     Comment: alcohol free for  1 year was drinking 1/2  pint per day   . Drug use: No  . Sexual activity: Not on file   Other Topics Concern  . Not on file   Social History Narrative   Patient lives at home with spouse.   Caffeine Use: 1-3 cups daily    Family History  Problem Relation Age of Onset  . Hypertension Mother   . Diabetes Sister   . Hypertension Sister     PHYSICAL EXAM: Vitals:   01/15/17 2230 01/15/17 2300  BP: 138/72 (!) 150/83  Pulse: (!) 46 (!) 47  Resp: 18 12  Temp:  98.4 F (36.9 C)    No intake or output data in the 24 hours ending 01/15/17 2328  General: Frail appearing, resting comfortably in bed, fair historian HEENT: NCAT, MMM, poor dentition. Eyes not tested Neck: supple. no JVD. Carotids 2+ bilat; no bruits. No lymphadenopathy or thryomegaly appreciated. Cor: PMI nondisplaced. Bradycardic, regular rhythm. No rubs, gallops or murmurs. Lungs: clear Abdomen: soft, nontender, nondistended. No hepatosplenomegaly. No bruits or masses. Good bowel sounds. Extremities: no cyanosis, clubbing, rash, edema Neuro: appropriate, moves all extremities independently  ECG: sinus rhythm with 2:1 AV block  Results for orders placed or performed during the hospital encounter of 01/15/17 (from the past 24 hour(s))  Basic metabolic panel     Status: Abnormal   Collection Time: 01/15/17  5:04 PM  Result Value Ref Range   Sodium 141 135 - 145 mmol/L   Potassium 5.1 3.5 - 5.1 mmol/L   Chloride 104 101 - 111 mmol/L   CO2 29 22 - 32 mmol/L   Glucose, Bld 102 (H) 65 - 99 mg/dL   BUN 26 (H) 6 - 20 mg/dL   Creatinine, Ser 1.99 (H) 0.61 - 1.24 mg/dL   Calcium 10.7 (H) 8.9 - 10.3 mg/dL   GFR calc non Af Amer 34 (L) >60 mL/min   GFR calc Af Amer 39 (L) >60 mL/min   Anion gap 8 5 - 15  CBC     Status: None   Collection Time: 01/15/17  5:04 PM  Result Value Ref Range   WBC 9.8 4.0 - 10.5 K/uL   RBC 4.99 4.22 - 5.81 MIL/uL   Hemoglobin 14.1 13.0 - 17.0 g/dL   HCT 43.7 39.0 - 52.0 %   MCV 87.6  78.0 - 100.0 fL   MCH 28.3 26.0 - 34.0 pg   MCHC 32.3 30.0 - 36.0 g/dL   RDW 15.1 11.5 - 15.5 %   Platelets 293 150 - 400 K/uL  I-stat troponin, ED     Status: None   Collection Time: 01/15/17  8:23 PM  Result Value Ref Range   Troponin i, poc 0.01 0.00 - 0.08 ng/mL   Comment 3           Dg Chest 2 View  Result Date: 01/15/2017 CLINICAL DATA:  Acute onset of bradycardia.  Initial encounter. EXAM: CHEST  2 VIEW COMPARISON:  Chest radiograph performed 08/04/2016, and CT of the chest performed 08/11/2016 FINDINGS: The lungs are well-aerated and clear. There is no evidence of focal opacification, pleural effusion or pneumothorax. The heart is normal in size; the mediastinal contour is within normal limits. No acute osseous abnormalities are seen. IMPRESSION: No acute cardiopulmonary process seen. Electronically Signed   By: Garald Balding M.D.   On: 01/15/2017 19:51   ASSESSMENT/PLAN: Mr. Tsang is a 66 year old man seen in consultation at the request of Dr. Johnney Killian for bradycardia. ECG and telemetry consistent with  2:1 AV block.   1) Bradycardia: 2:1 AV block (second degree) -because rhythm is exactly 2:1 second degree AV block, cannot further stratify into [place] I or II. He denies presycope/syncope but did have a recent fall requiring stitches, which is concerning. Recommend overnight monitoring to determine if he has other evidence of conduction disease, such as a significant pause. Would also be helpful to see if he goes into a sustained rhythm beyond 2:1 block to further risk stratify -observe overnight on telemetry, AM ECG -cardiology to review telemetry/ECG to further evaluate 2:1 block -consider Holter if telemetry unrevealing -not on significant AV nodal agents; on timolol eye drops only but this is not new  Please call with any issues/questions overnight.

## 2017-01-16 ENCOUNTER — Encounter (HOSPITAL_COMMUNITY)
Admission: EM | Disposition: A | Discharge status: Home / Self Care | Payer: Self-pay | Source: Home / Self Care | Attending: Internal Medicine

## 2017-01-16 DIAGNOSIS — H35033 Hypertensive retinopathy, bilateral: Secondary | ICD-10-CM

## 2017-01-16 DIAGNOSIS — E785 Hyperlipidemia, unspecified: Secondary | ICD-10-CM | POA: Diagnosis present

## 2017-01-16 DIAGNOSIS — Z79899 Other long term (current) drug therapy: Secondary | ICD-10-CM | POA: Diagnosis not present

## 2017-01-16 DIAGNOSIS — I69391 Dysphagia following cerebral infarction: Secondary | ICD-10-CM | POA: Diagnosis not present

## 2017-01-16 DIAGNOSIS — Z720 Tobacco use: Secondary | ICD-10-CM | POA: Diagnosis not present

## 2017-01-16 DIAGNOSIS — I69354 Hemiplegia and hemiparesis following cerebral infarction affecting left non-dominant side: Secondary | ICD-10-CM | POA: Diagnosis not present

## 2017-01-16 DIAGNOSIS — R634 Abnormal weight loss: Secondary | ICD-10-CM | POA: Diagnosis present

## 2017-01-16 DIAGNOSIS — F17210 Nicotine dependence, cigarettes, uncomplicated: Secondary | ICD-10-CM | POA: Diagnosis present

## 2017-01-16 DIAGNOSIS — N183 Chronic kidney disease, stage 3 (moderate): Secondary | ICD-10-CM | POA: Diagnosis present

## 2017-01-16 DIAGNOSIS — R1310 Dysphagia, unspecified: Secondary | ICD-10-CM | POA: Diagnosis present

## 2017-01-16 DIAGNOSIS — I441 Atrioventricular block, second degree: Secondary | ICD-10-CM | POA: Diagnosis not present

## 2017-01-16 DIAGNOSIS — R001 Bradycardia, unspecified: Secondary | ICD-10-CM | POA: Diagnosis present

## 2017-01-16 DIAGNOSIS — Z7951 Long term (current) use of inhaled steroids: Secondary | ICD-10-CM | POA: Diagnosis not present

## 2017-01-16 DIAGNOSIS — I10 Essential (primary) hypertension: Secondary | ICD-10-CM | POA: Diagnosis not present

## 2017-01-16 DIAGNOSIS — I459 Conduction disorder, unspecified: Secondary | ICD-10-CM | POA: Diagnosis not present

## 2017-01-16 DIAGNOSIS — I129 Hypertensive chronic kidney disease with stage 1 through stage 4 chronic kidney disease, or unspecified chronic kidney disease: Secondary | ICD-10-CM | POA: Diagnosis present

## 2017-01-16 DIAGNOSIS — H409 Unspecified glaucoma: Secondary | ICD-10-CM | POA: Diagnosis present

## 2017-01-16 DIAGNOSIS — E7800 Pure hypercholesterolemia, unspecified: Secondary | ICD-10-CM | POA: Diagnosis present

## 2017-01-16 DIAGNOSIS — Z7982 Long term (current) use of aspirin: Secondary | ICD-10-CM | POA: Diagnosis not present

## 2017-01-16 DIAGNOSIS — Z6820 Body mass index (BMI) 20.0-20.9, adult: Secondary | ICD-10-CM | POA: Diagnosis not present

## 2017-01-16 HISTORY — PX: PACEMAKER IMPLANT: EP1218

## 2017-01-16 HISTORY — DX: Hypertensive retinopathy, bilateral: H35.033

## 2017-01-16 LAB — CBC
HEMATOCRIT: 40.7 % (ref 39.0–52.0)
Hemoglobin: 13.2 g/dL (ref 13.0–17.0)
MCH: 28.1 pg (ref 26.0–34.0)
MCHC: 32.4 g/dL (ref 30.0–36.0)
MCV: 86.8 fL (ref 78.0–100.0)
Platelets: 281 10*3/uL (ref 150–400)
RBC: 4.69 MIL/uL (ref 4.22–5.81)
RDW: 15 % (ref 11.5–15.5)
WBC: 10.3 10*3/uL (ref 4.0–10.5)

## 2017-01-16 LAB — COMPREHENSIVE METABOLIC PANEL
ALK PHOS: 80 U/L (ref 38–126)
ALT: 9 U/L — ABNORMAL LOW (ref 17–63)
ANION GAP: 8 (ref 5–15)
AST: 12 U/L — ABNORMAL LOW (ref 15–41)
Albumin: 3.5 g/dL (ref 3.5–5.0)
BUN: 25 mg/dL — ABNORMAL HIGH (ref 6–20)
CALCIUM: 10 mg/dL (ref 8.9–10.3)
CO2: 24 mmol/L (ref 22–32)
Chloride: 107 mmol/L (ref 101–111)
Creatinine, Ser: 2.08 mg/dL — ABNORMAL HIGH (ref 0.61–1.24)
GFR calc non Af Amer: 32 mL/min — ABNORMAL LOW (ref >60)
GFR, EST AFRICAN AMERICAN: 37 mL/min — AB (ref >60)
Glucose, Bld: 141 mg/dL — ABNORMAL HIGH (ref 65–99)
Potassium: 3.9 mmol/L (ref 3.5–5.1)
Sodium: 139 mmol/L (ref 135–145)
TOTAL PROTEIN: 6.5 g/dL (ref 6.5–8.1)
Total Bilirubin: 0.7 mg/dL (ref 0.3–1.2)

## 2017-01-16 LAB — HIV ANTIBODY (ROUTINE TESTING W REFLEX): HIV SCREEN 4TH GENERATION: NONREACTIVE

## 2017-01-16 LAB — TSH: TSH: 1.606 u[IU]/mL (ref 0.350–4.500)

## 2017-01-16 LAB — SEDIMENTATION RATE: Sed Rate: 16 mm/hr (ref 0–16)

## 2017-01-16 SURGERY — PACEMAKER IMPLANT

## 2017-01-16 MED ORDER — LIDOCAINE HCL (PF) 1 % IJ SOLN
INTRAMUSCULAR | Status: DC | PRN
  Administered 2017-01-16: 51 mL

## 2017-01-16 MED ORDER — ONDANSETRON HCL 4 MG/2ML IJ SOLN: 4.0000 mg | Freq: Four times a day (QID) | INTRAMUSCULAR | Status: DC | PRN

## 2017-01-16 MED ORDER — CEFAZOLIN SODIUM-DEXTROSE 2-4 GM/100ML-% IV SOLN
2.0000 g | INTRAVENOUS | Status: AC
  Administered 2017-01-16: 2 g via INTRAVENOUS

## 2017-01-16 MED ORDER — CEFAZOLIN SODIUM-DEXTROSE 1-4 GM/50ML-% IV SOLN
1.0000 g | Freq: Three times a day (TID) | INTRAVENOUS | Status: AC
  Administered 2017-01-16 – 2017-01-17 (×2): 1 g via INTRAVENOUS
  Filled 2017-01-16 (×2): qty 50

## 2017-01-16 MED ORDER — CHLORHEXIDINE GLUCONATE 4 % EX LIQD: 60.0000 mL | Freq: Once | CUTANEOUS | Status: DC

## 2017-01-16 MED ORDER — LIDOCAINE HCL (PF) 1 % IJ SOLN
INTRAMUSCULAR | Status: AC
  Filled 2017-01-16: qty 60

## 2017-01-16 MED ORDER — HEPARIN (PORCINE) IN NACL 2-0.9 UNIT/ML-% IJ SOLN
INTRAMUSCULAR | Status: DC | PRN
  Administered 2017-01-16: 1000 mL

## 2017-01-16 MED ORDER — SODIUM CHLORIDE 0.9 % IV SOLN: 250.0000 mL | INTRAVENOUS | Status: DC

## 2017-01-16 MED ORDER — GENTAMICIN SULFATE 40 MG/ML IJ SOLN
80.0000 mg | INTRAMUSCULAR | Status: AC
  Administered 2017-01-16: 80 mg

## 2017-01-16 MED ORDER — SODIUM CHLORIDE 0.9 % IV SOLN: INTRAVENOUS | Status: DC

## 2017-01-16 MED ORDER — HEPARIN (PORCINE) IN NACL 2-0.9 UNIT/ML-% IJ SOLN
INTRAMUSCULAR | Status: AC
  Filled 2017-01-16: qty 500

## 2017-01-16 MED ORDER — SODIUM CHLORIDE 0.9% FLUSH: 3.0000 mL | INTRAVENOUS | Status: DC | PRN

## 2017-01-16 MED ORDER — SODIUM CHLORIDE 0.9% FLUSH: 3.0000 mL | Freq: Two times a day (BID) | INTRAVENOUS | Status: DC

## 2017-01-16 MED ORDER — CEFAZOLIN SODIUM-DEXTROSE 2-4 GM/100ML-% IV SOLN
INTRAVENOUS | Status: AC
  Filled 2017-01-16: qty 100

## 2017-01-16 MED ORDER — SODIUM CHLORIDE 0.9 % IR SOLN
Status: AC
  Filled 2017-01-16: qty 2

## 2017-01-16 SURGICAL SUPPLY — 8 items
CABLE SURGICAL S-101-97-12 (CABLE) ×1 IMPLANT
IPG PACE AZUR XT DR MRI W1DR01 (Pacemaker) IMPLANT
LEAD CAPSURE NOVUS 5076-52CM (Lead) ×1 IMPLANT
LEAD CAPSURE NOVUS 5076-58CM (Lead) ×1 IMPLANT
PACE AZURE XT DR MRI W1DR01 (Pacemaker) ×2 IMPLANT
PAD DEFIB LIFELINK (PAD) ×1 IMPLANT
SHEATH CLASSIC 7F (SHEATH) ×2 IMPLANT
TRAY PACEMAKER INSERTION (PACKS) ×1 IMPLANT

## 2017-01-16 NOTE — Progress Notes (Signed)
PROGRESS NOTE    Darrell [name]  UXE:[id number] DOB: [birth year] DOA: 01/15/2017 PCP: Minerva Ends, MD   Outpatient Specialists:     Brief Narrative:  Darrell [name] is a 66 y.o. male with medical history significant of  HTN, HLD, CKD stage III, tobacco abuse, and hypercalcemia; who presents from PCP office for heart rates in the 59s. Patient wife notes that since yesterday he is intermittently had heart rates in the 40s when checking blood pressures. He had a routine follow-up appointment with his primary care provider today and was noted to have heart rates in the 40s for which her EKG showed a 2:1 AV block. Of note patient had fallen 3 weeks ago after feeling dizzy/lightheaded and off balance. He suffered a contusion to his head requiring stitches. Other associated symptoms include note of chronic productive cough with white sputum over the last several weeks and some weight loss of approximately 5-10 pounds last few weeks. Otherwise denies any shortness of breath, chest pain, palpitations, leg swelling, nausea, vomiting, or diarrhea. He reports continued to smoke on a regular basis.    Assessment & Plan:   Active Problems:   HTN (hypertension)   Tobacco abuse   CKD (chronic kidney disease) stage 3, GFR 30-59 ml/min   Glaucoma   Bradycardia   Hypercalcemia   Heart block   Heart block: Acute. 2:1 A-V heart block  -for pacemaker today  Essential hypertension - Continue amlodipine and hydralazine  CKD stage 3: Stable. Baseline creatinine ranges from 1.45-2.37  - continue to monitor   Hypercalcemia: Acute on chronic. Mild elevation in calcium levels at 10.7.  -improved with IVF  Chronic cough and Weight loss: Recent weight loss of 5-10 pounds. Patient with long smoking history and associated symptoms of hypercalcemia given concern for possibility of malignancy. -TSH/ESR normal -?outpatient ENT eval-- has muffled voice/cough (started after stroke) but may need scope to  ensure no laryngeal mass  Glaucoma - Continue home eyedrops  Tobacco abuse: Patient reports still continued to smoke on a regular basis. - Counseled on the need for cessation of tobacco abuse - Nicotine patch offered    DVT prophylaxis:  scd  Code Status: Full Code   Family Communication: Wife at bedside  Disposition Plan:     Consultants:   EP     Subjective: Anxious about procedure but agreeable to have done  Objective: Vitals:   01/16/17 0130 01/16/17 0200 01/16/17 0300 01/16/17 0500  BP: 99/61  138/62 132/85  Pulse:   67 67  Resp: (!) _0 Temp:   98.2 F (36.8 C) 98.2 F (36.8 C)  TempSrc:   Oral Oral  SpO2:      Weight:   75.7 kg (166 lb 14.4 oz)   Height:        Intake/Output Summary (Last 24 hours) at 01/16/17 1038 Last data filed at 01/16/17 0700  Gross per 24 hour  Intake           1107.5 ml  Output                0 ml  Net           1107.5 ml   Filed Weights   01/15/17 2315 01/16/17 0300  Weight: 75.5 kg (166 lb 8 oz) 75.7 kg (166 lb 14.4 oz)    Examination:  General exam: Anxious Respiratory system: Clear to auscultation. Respiratory effort normal. Cardiovascular system: S1 & S2 heard, bradycardia. No JVD,  murmurs, rubs, gallops or clicks. No pedal edema. Gastrointestinal system: Abdomen is nondistended, soft and nontender. No organomegaly or masses felt. Normal bowel sounds heard. Central nervous system: Alert and oriented. No focal neurological deficits. Extremities: Symmetric 5 x 5 power. Skin: No rashes, lesions or ulcers Psychiatry: Judgement and insight appear normal- anxious appearing, speech slurred    Data Reviewed: I have personally reviewed following labs and imaging studies  CBC:  Recent Labs Lab 01/15/17 1704 01/15/17 2348  WBC 9.8 10.3  HGB 14.1 13.2  HCT 43.7 40.7  MCV 87.6 86.8  PLT 293 017   Basic Metabolic Panel:  Recent Labs Lab 01/15/17 1704 01/15/17 2348  NA 141 139  K 5.1 3.9    CL 104 107  CO2 29 24  GLUCOSE 102* 141*  BUN 26* 25*  CREATININE 1.99* 2.08*  CALCIUM 10.7* 10.0   GFR: Estimated Creatinine Clearance: 38.4 mL/min (A) (by C-G formula based on SCr of 2.08 mg/dL (H)). Liver Function Tests:  Recent Labs Lab 01/15/17 2348  AST 12*  ALT 9*  ALKPHOS 80  BILITOT 0.7  PROT 6.5  ALBUMIN 3.5   No results for input(s): LIPASE, AMYLASE in the last 168 hours. No results for input(s): AMMONIA in the last 168 hours. Coagulation Profile: No results for input(s): INR, PROTIME in the last 168 hours. Cardiac Enzymes: No results for input(s): CKTOTAL, CKMB, CKMBINDEX, TROPONINI in the last 168 hours. BNP (last 3 results) No results for input(s): PROBNP in the last 8760 hours. HbA1C: No results for input(s): HGBA1C in the last 72 hours. CBG: No results for input(s): GLUCAP in the last 168 hours. Lipid Profile: No results for input(s): CHOL, HDL, LDLCALC, TRIG, CHOLHDL, LDLDIRECT in the last 72 hours. Thyroid Function Tests:  Recent Labs  01/15/17 2348  TSH 1.606   Anemia Panel: No results for input(s): VITAMINB12, FOLATE, FERRITIN, TIBC, IRON, RETICCTPCT in the last 72 hours. Urine analysis:    Component Value Date/Time   COLORURINE YELLOW 10/07/2013 1331   APPEARANCEUR CLEAR 10/07/2013 1331   LABSPEC 1.015 08/04/2016 1517   PHURINE 5.5 08/04/2016 1517   GLUCOSEU NEGATIVE 08/04/2016 1517   HGBUR NEGATIVE 08/04/2016 1517   BILIRUBINUR negative 08/10/2016 [place] 08/04/2016 1517   PROTEINUR 30 08/10/2016 1658   PROTEINUR 30 (A) 08/04/2016 1517   UROBILINOGEN 1.0 08/10/2016 1658   UROBILINOGEN 4.0 (H) 08/04/2016 1517   NITRITE negative 08/10/2016 1658   NITRITE NEGATIVE 08/04/2016 1517   LEUKOCYTESUR small (1+) (A) 08/10/2016 1658   )No results found for this or any previous visit (from the past 240 hour(s)).    Anti-infectives    None       Radiology Studies: Dg Chest 2 View  Result Date: 01/15/2017 CLINICAL  DATA:  Acute onset of bradycardia.  Initial encounter. EXAM: CHEST  2 VIEW COMPARISON:  Chest radiograph performed 08/04/2016, and CT of the chest performed 08/11/2016 FINDINGS: The lungs are well-aerated and clear. There is no evidence of focal opacification, pleural effusion or pneumothorax. The heart is normal in size; the mediastinal contour is within normal limits. No acute osseous abnormalities are seen. IMPRESSION: No acute cardiopulmonary process seen. Electronically Signed   By: Garald Balding M.D.   On: 01/15/2017 19:51        Scheduled Meds: . amLODipine  10 mg Oral q morning - 10a  . aspirin EC  81 mg Oral Daily  . atorvastatin  40 mg Oral Daily  . brimonidine  1 drop Both  Eyes BID  . dorzolamide-timolol  1 drop Both Eyes BID  . fluticasone  2 spray Each Nare Daily  . hydrALAZINE  50 mg Oral TID  . latanoprost  1 drop Both Eyes QHS  . nicotine  21 mg Transdermal Daily  . torsemide  20 mg Oral Daily   Continuous Infusions: . sodium chloride 75 mL/hr at 01/15/17 2238     LOS: 0 days    Time spent: 25 min    [place], DO [facility] Pager [phone]  If 7PM-7AM, please contact night-coverage [email or web address] Password [facility] 01/16/2017, 10:38 AM

## 2017-01-16 NOTE — Progress Notes (Signed)
Pt's HR is between 36-38 b/min sustained, pt is asleep. Lamar Blinks NP  and Dr. Buford Dresser made aware. No new orders made, will continue to monitor.

## 2017-01-16 NOTE — Progress Notes (Signed)
Pt and wife was given a video to watch on Life with a Pacemaker per PA instruction. Print out and gave pt more info on Biventricular Pacemaker Implantation under [employer].

## 2017-01-16 NOTE — Progress Notes (Signed)
   01/15/17 2300  Vitals  Temp 98.4 F (36.9 C)  Temp Source Oral  BP (!) 150/83  MAP (mmHg) 103  Pulse Rate (!) 47  ECG Heart Rate (!) 45  Resp 12  Oxygen Therapy  SpO2 100 %  O2 Device Room Air  Admitted pt to rm [phone] from ED, pt alert and oriented, denied pain at this time, oriented to room, call bell placed within reach, placed on cardiac monitor, CCMD made aware.

## 2017-01-16 NOTE — ED Provider Notes (Signed)
[facility] DEPT Provider Note   CSN: [id number] Arrival date & time: 01/15/17  1647     History   Chief Complaint Chief Complaint  Patient presents with  . Bradycardia    HPI Darrell [name] is a 66 y.o. male.  HPI He has sent from primary care physician's office for abnormal EKG and irregular heartbeat. Patient denies any symptoms. He reports he was going in for routine visit. He denies chest pain, syncope or dyspnea. Past Medical History:  Diagnosis Date  . Chronic kidney disease    stage 3 GFR 30-59 ml/min   . Dysphagia as late effect of cerebrovascular disease    pts wife states pt has to eat soft foods   . GERD (gastroesophageal reflux disease)   . Glaucoma   . High cholesterol   . Hypertension   . Neuromuscular disorder ([place])    chronic inflammatory demyelinating polyneuropathy   . Stroke [facility])    2011 with residual deficit left sided weakness  . Tobacco dependence     Patient Active Problem List   Diagnosis Date Noted  . Bradycardia 01/15/2017  . Hypercalcemia 01/15/2017  . Heart block 01/15/2017  . Constipation 09/12/2016  . Glaucoma 09/12/2016  . Liver hemangioma 08/14/2016  . Renal cyst 08/14/2016  . Renal mass, right 08/14/2016  . Elevated liver enzymes 08/10/2016  . CKD (chronic kidney disease) stage 3, GFR 30-59 ml/min 08/16/2015  . At high risk for falls 08/16/2015  . Subcutaneous nodules 08/16/2015  . Prediabetes 10/07/2013  . Inguinal hernia 03/25/2013  . History of CVA with residual deficit 03/25/2013  . HTN (hypertension) 10/09/2012  . Tobacco abuse 10/09/2012    Past Surgical History:  Procedure Laterality Date  . EYE SURGERY    . INGUINAL HERNIA REPAIR Right 11/23/2014   Procedure: right inguinal hernia repair with mesh;  Surgeon: Armandina Gemma, MD;  Location: [facility] ORS;  Service: General;  Laterality: Right;  . INSERTION OF MESH N/A 11/23/2014   Procedure: INSERTION OF MESH;  Surgeon: Armandina Gemma, MD;  Location: [facility] ORS;  Service:  General;  Laterality: N/A;  . SHOULDER SURGERY Bilateral 1988, 1998       Home Medications    Prior to Admission medications   Medication Sig Start Date End Date Taking? Authorizing Provider  amLODipine (NORVASC) 10 MG tablet Take 1 tablet (10 mg total) by mouth every morning. 05/25/16  Yes Boykin Nearing, MD  aspirin EC 81 MG tablet Take 1 tablet (81 mg total) by mouth daily. 05/25/16  Yes Josalyn Funches, MD  atorvastatin (LIPITOR) 40 MG tablet Take 1 tablet (40 mg total) by mouth daily. 05/25/16  Yes Josalyn Funches, MD  brimonidine (ALPHAGAN P) 0.1 % SOLN Place 1 drop into both eyes 2 (two) times daily.   Yes Historical Provider, MD  dorzolamide-timolol (COSOPT) 22.3-6.8 MG/ML ophthalmic solution Place 1 drop into both eyes 2 (two) times daily.   Yes Historical Provider, MD  fluticasone (FLONASE) 50 MCG/ACT nasal spray Place 2 sprays into both nostrils daily. 09/12/16  Yes Josalyn Funches, MD  hydrALAZINE (APRESOLINE) 50 MG tablet Take 1 tablet (50 mg total) by mouth 3 (three) times daily. 08/10/16  Yes Josalyn Funches, MD  latanoprost (XALATAN) 0.005 % ophthalmic solution Place 1 drop into both eyes at bedtime.   Yes Historical Provider, MD  Multiple Vitamins-Iron (DAILY VITAMIN FORMULA+IRON) TABS Take 1 tablet by mouth daily.   Yes Historical Provider, MD  Psyllium (EQ DAILY FIBER PO) Take 1 tablet by mouth daily.  Yes Historical Provider, MD  torsemide (DEMADEX) 20 MG tablet Take 20 mg by mouth daily.   Yes Historical Provider, MD  diclofenac sodium (VOLTAREN) 1 % GEL Apply 2 g topically 4 (four) times daily. Patient not taking: Reported on 01/15/2017 05/25/16   Boykin Nearing, MD  ketoconazole (NIZORAL) 2 % cream Apply 1 application topically 2 (two) times daily. 01/15/17   Josalyn Funches, MD  mirtazapine (REMERON) 7.5 MG tablet Take 1 tablet (7.5 mg total) by mouth at bedtime. Patient not taking: Reported on 01/15/2017 09/12/16   Boykin Nearing, MD  polyethylene glycol powder  (GLYCOLAX/MIRALAX) powder Take 17 g by mouth daily. Patient not taking: Reported on 01/15/2017 09/12/16   Boykin Nearing, MD    Family History Family History  Problem Relation Age of Onset  . Hypertension Mother   . Diabetes Sister   . Hypertension Sister     Social History Social History  Substance Use Topics  . Smoking status: Current Every Day Smoker    Packs/day: 1.00    Years: 40.00    Types: Cigarettes  . Smokeless tobacco: Never Used  . Alcohol use Yes     Comment: alcohol free for 1 year was drinking 1/2 pint per day      Allergies   Ace inhibitors; Doxycycline; Atacand hct [candesartan cilexetil-hctz]; and Shellfish allergy   Review of Systems Review of Systems  10 Systems reviewed and are negative for acute change except as noted in the HPI.  Physical Exam Updated Vital Signs BP (!) 150/83   Pulse (!) 47   Temp 98.4 F (36.9 C) (Oral)   Resp 12   Ht 6\' 3"  (1.905 m)   Wt 166 lb 8 oz (75.5 kg)   SpO2 100%   BMI 20.81 kg/m   Physical Exam  Constitutional: He appears well-developed and well-nourished.  HENT:  Head: Normocephalic and atraumatic.  Eyes: Conjunctivae are normal.  Neck: Neck supple.  Cardiovascular: Normal rate and regular rhythm.   No murmur heard. Occasional ectopy. Monitor shows intermittent sinus rhythm in the 60s and 70s, and then periods of bradycardia in the 40s and 50s. Patient remains asymptomatic with these episodes.  Pulmonary/Chest: Effort normal and breath sounds normal. No respiratory distress.  Abdominal: Soft. There is no tenderness.  Musculoskeletal: He exhibits no edema.  Neurological: He is alert.  Skin: Skin is warm and dry.  Psychiatric: He has a normal mood and affect.  Nursing note and vitals reviewed.    ED Treatments / Results  Labs (all labs ordered are listed, but only abnormal results are displayed) Labs Reviewed  BASIC METABOLIC PANEL - Abnormal; Notable for the following:       Result Value    Glucose, Bld 102 (*)    BUN 26 (*)    Creatinine, Ser 1.99 (*)    Calcium 10.7 (*)    GFR calc non Af Amer 34 (*)    GFR calc Af Amer 39 (*)    All other components within normal limits  CBC  CBC  HIV ANTIBODY (ROUTINE TESTING)  SEDIMENTATION RATE  COMPREHENSIVE METABOLIC PANEL  CALCIUM, IONIZED  TSH  I-STAT TROPOININ, ED  I-STAT TROPOININ, ED    EKG  EKG Interpretation  Date/Time:  Monday January 15 2017 16:55:36 EDT Ventricular Rate:  43 PR Interval:  164 QRS Duration: 146 QT Interval:  504 QTC Calculation: 425 R Axis:   -53 Text Interpretation:  2 to 1 AV block Left axis deviation Non-specific intra-ventricular conduction block Abnormal  ECG similsr to earlier EKG today Confirmed by Johnney Killian, MD, Jeannie Done [id number]) on 01/15/2017 5:49:08 PM       Radiology Dg Chest 2 View  Result Date: 01/15/2017 CLINICAL DATA:  Acute onset of bradycardia.  Initial encounter. EXAM: CHEST  2 VIEW COMPARISON:  Chest radiograph performed 08/04/2016, and CT of the chest performed 08/11/2016 FINDINGS: The lungs are well-aerated and clear. There is no evidence of focal opacification, pleural effusion or pneumothorax. The heart is normal in size; the mediastinal contour is within normal limits. No acute osseous abnormalities are seen. IMPRESSION: No acute cardiopulmonary process seen. Electronically Signed   By: Garald Balding M.D.   On: 01/15/2017 19:51    Procedures Procedures (including critical care time)  Medications Ordered in ED Medications  0.9 %  sodium chloride infusion ( Intravenous New Bag/Given 01/15/17 2238)  nicotine (NICODERM CQ - dosed in mg/24 hours) patch 21 mg (21 mg Transdermal Patch Applied 01/15/17 2349)  fluticasone (FLONASE) 50 MCG/ACT nasal spray 2 spray (not administered)  hydrALAZINE (APRESOLINE) tablet 50 mg (50 mg Oral Not Given 01/15/17 2300)  amLODipine (NORVASC) tablet 10 mg (not administered)  brimonidine (ALPHAGAN) 0.15 % ophthalmic solution 1 drop (1 drop Both Eyes  Given 01/15/17 2348)  aspirin EC tablet 81 mg (not administered)  atorvastatin (LIPITOR) tablet 40 mg (not administered)  latanoprost (XALATAN) 0.005 % ophthalmic solution 1 drop (1 drop Both Eyes Not Given 01/15/17 2245)  dorzolamide-timolol (COSOPT) 22.3-6.8 MG/ML ophthalmic solution 1 drop (1 drop Both Eyes Given 01/15/17 2347)  torsemide (DEMADEX) tablet 20 mg (not administered)  enoxaparin (LOVENOX) injection 40 mg (not administered)  acetaminophen (TYLENOL) tablet 650 mg (not administered)    Or  acetaminophen (TYLENOL) suppository 650 mg (not administered)  ondansetron (ZOFRAN) tablet 4 mg (not administered)    Or  ondansetron (ZOFRAN) injection 4 mg (not administered)  ipratropium-albuterol (DUONEB) 0.5-2.5 (3) MG/3ML nebulizer solution 3 mL (not administered)     Initial Impression / Assessment and Plan / ED Course  I have reviewed the triage vital signs and the nursing notes.  Pertinent labs & imaging results that were available during my care of the patient were reviewed by me and considered in my medical decision making (see chart for details).     Consult: Cardiology Consult: Hospitalist for admission Final Clinical Impressions(s) / ED Diagnoses   Final diagnoses:  Bradycardia    New Prescriptions Current Discharge Medication List       Charlesetta Shanks, MD 01/16/17 [phone]

## 2017-01-16 NOTE — Consult Note (Signed)
ELECTROPHYSIOLOGY CONSULT NOTE    Patient ID: Darrell [name] MRN: [id number], DOB/AGE: [birth year] 66 y.o.  Admit date: 01/15/2017 Date of Consult: 01/16/2017   Primary Physician: Minerva Ends, MD Primary Cardiologist: None  Reason for Consultation: heart block  HPI: Darrell [name] is a 66 y.o. male who is being seen today for the evaluation of heart block, bradycardia at the request of Harrell Gave.   PMHx includes stroke in 2011 (per wife) with remaining gait instability and L sided weakness, HTN, CKD (III), ongoing smoker, HLD, and hypocalcemia, was referred to the ER by his PMD who noted him in heart block at a routine visit.  The patient and his wife report that he keeps an eye on his BP at home and in the ;ast weeks have taken note at times the machine reports his pulse in the 40's, but thepatient denies any particular symptoms.  He had a reoutine Q# month visit with his PMD who did an EKG and noted the heart block.  The patient denies any dizziness, they do report a fall of fainting/near fainting episode 3 weeks ago that he did not seek attention for.  No CP, palpitations, no SOB.  LABS: K+ 5.1 >> 3.9 BUN/Creat 26/1.99 >> 25/2.08 Ca++ 10.0 poc Trop 0.01 WBC 9.8 H/H 14/43 plts 293 TSH 1.606  HOME meds include Norvasc and Timolol, not felt by Dr. Curt Bears to be etiology or contributing to his heart block  Past Medical History:  Diagnosis Date  . Chronic kidney disease    stage 3 GFR 30-59 ml/min   . Dysphagia as late effect of cerebrovascular disease    pts wife states pt has to eat soft foods   . GERD (gastroesophageal reflux disease)   . Glaucoma   . High cholesterol   . Hypertension   . Neuromuscular disorder ([place])    chronic inflammatory demyelinating polyneuropathy   . Stroke [facility])    2011 with residual deficit left sided weakness  . Tobacco dependence      Surgical History:  Past Surgical History:  Procedure Laterality Date  . EYE SURGERY    . INGUINAL  HERNIA REPAIR Right 11/23/2014   Procedure: right inguinal hernia repair with mesh;  Surgeon: Armandina Gemma, MD;  Location: [facility] ORS;  Service: General;  Laterality: Right;  . INSERTION OF MESH N/A 11/23/2014   Procedure: INSERTION OF MESH;  Surgeon: Armandina Gemma, MD;  Location: [facility];  Service: General;  Laterality: N/A;  . SHOULDER SURGERY Bilateral 1988, 1998     Prescriptions Prior to Admission  Medication Sig Dispense Refill Last Dose  . amLODipine (NORVASC) 10 MG tablet Take 1 tablet (10 mg total) by mouth every morning. 90 tablet 2 01/15/2017 at Unknown time  . aspirin EC 81 MG tablet Take 1 tablet (81 mg total) by mouth daily. 90 tablet 3 01/15/2017 at Unknown time  . atorvastatin (LIPITOR) 40 MG tablet Take 1 tablet (40 mg total) by mouth daily. 90 tablet 3 01/14/2017 at Unknown time  . brimonidine (ALPHAGAN P) 0.1 % SOLN Place 1 drop into both eyes 2 (two) times daily.   Taking  . dorzolamide-timolol (COSOPT) 22.3-6.8 MG/ML ophthalmic solution Place 1 drop into both eyes 2 (two) times daily.   01/15/2017 at Unknown time  . fluticasone (FLONASE) 50 MCG/ACT nasal spray Place 2 sprays into both nostrils daily. 16 g 6 01/15/2017 at Unknown time  . hydrALAZINE (APRESOLINE) 50 MG tablet Take 1 tablet (50 mg total) by mouth 3 (three)  times daily. 90 tablet 5 01/15/2017 at Unknown time  . latanoprost (XALATAN) 0.005 % ophthalmic solution Place 1 drop into both eyes at bedtime.   01/14/2017 at Unknown time  . Multiple Vitamins-Iron (DAILY VITAMIN FORMULA+IRON) TABS Take 1 tablet by mouth daily.   01/15/2017 at Unknown time  . Psyllium (EQ DAILY FIBER PO) Take 1 tablet by mouth daily.   01/15/2017 at Unknown time  . torsemide (DEMADEX) 20 MG tablet Take 20 mg by mouth daily.   01/15/2017 at Unknown time  . diclofenac sodium (VOLTAREN) 1 % GEL Apply 2 g topically 4 (four) times daily. (Patient not taking: Reported on 01/15/2017) 100 g 5 Not Taking at Unknown time  . ketoconazole (NIZORAL) 2 % cream Apply 1  application topically 2 (two) times daily. 30 g 0   . mirtazapine (REMERON) 7.5 MG tablet Take 1 tablet (7.5 mg total) by mouth at bedtime. (Patient not taking: Reported on 01/15/2017) 30 tablet 2 Not Taking at Unknown time  . polyethylene glycol powder (GLYCOLAX/MIRALAX) powder Take 17 g by mouth daily. (Patient not taking: Reported on 01/15/2017) 3350 g 1 Not Taking at Unknown time    Inpatient Medications:  . amLODipine  10 mg Oral q morning - 10a  . aspirin EC  81 mg Oral Daily  . atorvastatin  40 mg Oral Daily  . brimonidine  1 drop Both Eyes BID  . dorzolamide-timolol  1 drop Both Eyes BID  . fluticasone  2 spray Each Nare Daily  . hydrALAZINE  50 mg Oral TID  . latanoprost  1 drop Both Eyes QHS  . nicotine  21 mg Transdermal Daily  . torsemide  20 mg Oral Daily    Allergies:  Allergies  Allergen Reactions  . Ace Inhibitors Other (See Comments)    Hyperkalemia   . Doxycycline Other (See Comments)    Hiccups, cough, nausea and emesis, elevated liver enzymes, elevated eosinophils, SOB concerning for early DRESS syndrome   . [place]and Hct [Candesartan Cilexetil-Hctz] Hives  . Shellfish Allergy Hives    Social History   Social History  . Marital status: Married    Spouse name: [name]  . Number of children: 1  . Years of education: college   Occupational History  . retired    Social History Main Topics  . Smoking status: Current Every Day Smoker    Packs/day: 1.00    Years: 40.00    Types: Cigarettes  . Smokeless tobacco: Never Used  . Alcohol use Yes     Comment: alcohol free for 1 year was drinking 1/2 pint per day   . Drug use: No  . Sexual activity: Not on file   Other Topics Concern  . Not on file   Social History Narrative   Patient lives at home with spouse.   Caffeine Use: 1-3 cups daily     Family History  Problem Relation Age of Onset  . Hypertension Mother   . Diabetes Sister   . Hypertension Sister      Review of Systems: All other systems  reviewed and are otherwise negative except as noted above.  Physical Exam: Vitals:   01/16/17 0130 01/16/17 0200 01/16/17 0300 01/16/17 0500  BP: 99/61  138/62 132/85  Pulse:   67 67  Resp: (!) 9 13 11 13   Temp:   98.2 F (36.8 C) 98.2 F (36.8 C)  TempSrc:   Oral Oral  SpO2:      Weight:   166 lb 14.4 oz (  75.7 kg)   Height:        GEN- The patient is well appearing, alert and oriented x 3 today.   HEENT: normocephalic, atraumatic; sclera clear, conjunctiva pink; hearing intact; oropharynx clear; neck supple, no JVP Lymph- no cervical lymphadenopathy Lungs- CTA b/l, normal work of breathing.  No wheezes, rales, rhonchi Heart- Regular rate and rhythm, no murmurs, rubs or gallops, PMI not laterally displaced GI- soft, non-tender, non-distended Extremities- no clubbing, cyanosis, or edema MS- no significant deformity or atrophy Skin- warm and dry, no rash or lesion Psych- euthymic mood, full affect Neuro- no gross deficits observed  Labs:   Lab Results  Component Value Date   WBC 10.3 01/15/2017   HGB 13.2 01/15/2017   HCT 40.7 01/15/2017   MCV 86.8 01/15/2017   PLT 281 01/15/2017    Recent Labs Lab 01/15/17 2348  NA 139  K 3.9  CL 107  CO2 24  BUN 25*  CREATININE 2.08*  CALCIUM 10.0  PROT 6.5  BILITOT 0.7  ALKPHOS 80  ALT 9*  AST 12*  GLUCOSE 141*      Radiology/Studies:  Dg Chest 2 View Result Date: 01/15/2017 CLINICAL DATA:  Acute onset of bradycardia.  Initial encounter. EXAM: CHEST  2 VIEW COMPARISON:  Chest radiograph performed 08/04/2016, and CT of the chest performed 08/11/2016 FINDINGS: The lungs are well-aerated and clear. There is no evidence of focal opacification, pleural effusion or pneumothorax. The heart is normal in size; the mediastinal contour is within normal limits. No acute osseous abnormalities are seen. IMPRESSION: No acute cardiopulmonary process seen. Electronically Signed   By: Garald Balding M.D.   On: 01/15/2017 19:51   Reviewed  by myself: EKG: #1/2 is 2:1 heart block, LBBB, 40bpm #3 is SR, 70bpm, LBBB #4 is Mobitz II TELEMETRY: SR c/w intermittent episodes of Mobitz II block with 2:1 conduction  10/10/12: TTE Study Conclusions - Left ventricle: The cavity size was normal. There was severe concentric and mild asymmetric hypertrophy. Systolic function was normal. Wall motion was normal; there were no regional wall motion abnormalities. Doppler parameters are consistent with abnormal left ventricular relaxation (grade 1 diastolic dysfunction). - Aortic root: The aortic root was mildly dilated. - Right atrium: The atrium was mildly dilated. - Pulmonary arteries: Systolic pressure was mildly increased. PA peak pressure: 72mm Hg (S).   Assessment and Plan:   1. High degree AVBlock     Mobitz II w/2:1 conduction     Reports of fall vs near syncope/syncope 3 weeks ago associated with dizziness     Dr. Curt Bears has seen and examined this patient, his norvasc/timolol are bnot felt to be contributing to his heart block and no reversible causes are noted.     Clear conductions sytem disease, LBBB and AVblock.     No anginal symptoms, no exertional symptoms reported.  Recommend PPM implant the wife at bedside is agreeable and wants the patient to proceed, risks/benefits were discussed as well at the procedure itself by Dr Curt Bears with the patient/wife.  The patient however is not agreeable to proceed.  Dr. Curt Bears discussed progression/worsening of his conduction system and consequences, but he is not agreeable.  He would like to give it some thought, I have asked RN to cue up informational vide and provide written material to the patient about pacemakers.  Ulani Degrasse give clear liquid breakfast, SCDs for DVT prophylaxis rather then lovenox in hopes he becomes agreeable,  and Avaline Stillson revisit with the patient later today.  2.  HTN     No changes  3. CKD  Signed, Tommye Standard, PA-C 01/16/2017 9:21 AM   I have  seen and examined this patient with Tommye Standard.  Agree with above, note added to reflect my findings.  On exam, iRRR, no murmurs, lungs clear. Patient presented to the hosptial with a low HR found to have Mobitz II and 2:1 AV block. On no nodal blockers. Plan for pacemaker placement. Risks and benefits explained. Risks include but not limited to bleeding, infection, tamponade, pneumothorax. The patient understands the risks and has agreed to the procedure.    Caliber Landess M. Seven Dollens MD 01/16/2017 1:43 PM

## 2017-01-16 NOTE — Care Management Note (Signed)
Case Management Note  Patient Details  Name: Darrell [name] MRN: [id number] Date of Birth: [birth year]  Subjective/Objective:   Pacemaker                 Action/Plan: Discharge Planning: NCM spoke to pt and wife at bedside. Pt states he has RW, wheelchair, crutches and bedside commode at home. Will continue to follow for dc needs.   PCP Boykin Nearing MD  Expected Discharge Date:                  Expected Discharge Plan:  Home/Self Care  In-House Referral:  NA  Discharge planning Services  CM Consult  Post Acute Care Choice:  NA Choice offered to:  NA  DME Arranged:  N/A DME Agency:  NA  HH Arranged:  NA HH Agency:  NA  Status of Service:  Completed, signed off  If discussed at [place] of Stay Meetings, dates discussed:    Additional Comments:  Erenest Rasher, RN 01/16/2017, 5:15 PM

## 2017-01-17 ENCOUNTER — Encounter (HOSPITAL_COMMUNITY): Payer: Self-pay | Admitting: Cardiology

## 2017-01-17 ENCOUNTER — Inpatient Hospital Stay (HOSPITAL_COMMUNITY): Payer: Medicare PPO

## 2017-01-17 DIAGNOSIS — I441 Atrioventricular block, second degree: Principal | ICD-10-CM

## 2017-01-17 LAB — CALCIUM, IONIZED: Calcium, Ionized, Serum: 5.7 mg/dL — ABNORMAL HIGH (ref 4.5–5.6)

## 2017-01-17 NOTE — Care Management (Signed)
1043 01-17-17 Jacqlyn Krauss, RN,BSN -Order obtained from MD for tub bench. Pt is without [facility] and will have a total of $60.00 for DME. Pt states he will think about cost and get at a later time if needed. No further needs from CM at this time.

## 2017-01-17 NOTE — Discharge Instructions (Signed)
Supplemental Discharge Instructions for  Pacemaker/Defibrillator Patients  Activity No heavy lifting or vigorous activity with your left/right arm for 6 to 8 weeks.  Do not raise your left/right arm above your head for one week.  Gradually raise your affected arm as drawn below.             01/20/17                     01/21/17                   01/22/17                   01/23/17 __  NO DRIVING for 1 week  ; you may begin driving on   12/30/94  .  WOUND CARE - Keep the wound area clean and dry.  Do not get this area wet, no showers until cleared to at your wound check visit. - The tape/steri-strips on your wound will fall off; do not pull them off.  No bandage is needed on the site.  DO  NOT apply any creams, oils, or ointments to the wound area. - If you notice any drainage or discharge from the wound, any swelling or bruising at the site, or you develop a fever > 101? F after you are discharged home, call the office at once.  Special Instructions - You are still able to use cellular telephones; use the ear opposite the side where you have your pacemaker/defibrillator.  Avoid carrying your cellular phone near your device. - When traveling through airports, show security personnel your identification card to avoid being screened in the metal detectors.  Ask the security personnel to use the hand wand. - Avoid arc welding equipment, MRI testing (magnetic resonance imaging), TENS units (transcutaneous nerve stimulators).  Call the office for questions about other devices. - Avoid electrical appliances that are in poor condition or are not properly grounded. - Microwave ovens are safe to be near or to operate.     Bradycardia, Adult Bradycardia is a slower-than-normal heartbeat. A normal resting heart rate for an adult ranges from 60 to 100 beats per minute. With bradycardia, the resting heart rate is less than 60 beats per minute. Bradycardia can prevent enough oxygen from reaching certain  areas of your body when you are active. It can be serious if it keeps enough oxygen from reaching your brain and other parts of your body. Bradycardia is not a problem for everyone. For some healthy adults, a slow resting heart rate is normal. What are the causes? This condition may be caused by:  A problem with the heart, including:  A problem with the heart's electrical system, such as a heart block.  A problem with the heart's natural pacemaker (sinus node).  Heart disease.  A heart attack.  Heart damage.  A heart infection.  A heart condition that is present at birth (congenital heart defect).  Certain medicines that treat heart conditions.  Certain conditions, such as hypothyroidism and obstructive sleep apnea.  Problems with the balance of chemicals and other substances, like potassium, in the blood. What increases the risk? This condition is more likely to develop in adults who:  Are age 58 or older.  Have high blood pressure (hypertension), high cholesterol (hyperlipidemia), or diabetes.  Drink heavily, use tobacco or nicotine products, or use drugs.  Are stressed. What are the signs or symptoms? Symptoms of this condition include:  Light-headedness.  Feeling faint or fainting.  Fatigue and weakness.  Shortness of breath.  Chest pain (angina).  Drowsiness.  Confusion.  Dizziness. How is this diagnosed? This condition may be diagnosed based on:  Your symptoms.  Your medical history.  A physical exam. During the exam, your health care provider will listen to your heartbeat and check your pulse. To confirm the diagnosis, your health care provider may order tests, such as:  Blood tests.  An electrocardiogram (ECG). This test records the heart's electrical activity. The test can show how fast your heart is beating and whether the heartbeat is steady.  A test in which you wear a portable device (event recorder or Holter monitor) to record your  heart's electrical activity while you go about your day.  Anexercise test. How is this treated? Treatment for this condition depends on the cause of the condition and how severe your symptoms are. Treatment may involve:  Treatment of the underlying condition.  Changing your medicines or how much medicine you take.  Having a small, battery-operated device called a pacemaker implanted under the skin. When bradycardia occurs, this device can be used to increase your heart rate and help your heart to beat in a regular rhythm. Follow these instructions at home: Lifestyle    Manage any health conditions that contribute to bradycardia as told by your health care provider.  Follow a heart-healthy diet. A nutrition specialist (dietitian) can help to educate you about healthy food options and changes.  Follow an exercise program that is approved by your health care provider.  Maintain a healthy weight.  Try to reduce or manage your stress, such as with yoga or meditation. If you need help reducing stress, ask your health care provider.  Do not use use any products that contain nicotine or tobacco, such as cigarettes and e-cigarettes. If you need help quitting, ask your health care provider.  Do not use illegal drugs.  Limit alcohol intake to no more than 1 drink per day for nonpregnant women and 2 drinks per day for men. One drink equals 12 oz of beer, 5 oz of wine, or 1 oz of hard liquor. General instructions   Take over-the-counter and prescription medicines only as told by your health care provider.  Keep all follow-up visits as directed by your health care provider. This is important. How is this prevented? In some cases, bradycardia may be prevented by:  Treating underlying medical problems.  Stopping behaviors or medicines that can trigger the condition. Contact a health care provider if:  You feel light-headed or dizzy.  You almost faint.  You feel weak or are easily  fatigued during physical activity.  You experience confusion or have memory problems. Get help right away if:  You faint.  You have an irregular heartbeat (palpitations).  You have chest pain.  You have trouble breathing. This information is not intended to replace advice given to you by your health care provider. Make sure you discuss any questions you have with your health care provider. Document Released: 06/03/2002 Document Revised: 05/09/2016 Document Reviewed: 03/02/2016 Elsevier Interactive Patient Education  2017 [employer].   Pacemaker Implantation, Adult Pacemaker implantation is a procedure to place a pacemaker inside your chest. A pacemaker is a small computer that sends electrical signals to the heart and helps your heart beat normally. A pacemaker also stores information about your heart rhythms. You may need pacemaker implantation if you:  Have a slow heartbeat (bradycardia).  Faint (syncope).  Have shortness of  breath (dyspnea) due to heart problems. The pacemaker attaches to your heart through a wire, called a lead. Sometimes just one lead is needed. Other times, there will be two leads. There are two types of pacemakers:  Transvenous pacemaker. This type is placed under the skin or muscle of your chest. The lead goes through a vein in the chest area to reach the inside of the heart.  Epicardial pacemaker. This type is placed under the skin or muscle of your chest or belly. The lead goes through your chest to the outside of the heart. Tell a health care provider about:  Any allergies you have.  All medicines you are taking, including vitamins, herbs, eye drops, creams, and over-the-counter medicines.  Any problems you or family members have had with anesthetic medicines.  Any blood or bone disorders you have.  Any surgeries you have had.  Any medical conditions you have.  Whether you are pregnant or may be pregnant. What are the risks? Generally, this  is a safe procedure. However, problems may occur, including:  Infection.  Bleeding.  Failure of the pacemaker or the lead.  Collapse of a lung or bleeding into a lung.  Blood clot inside a blood vessel with a lead.  Damage to the heart.  Infection inside the heart (endocarditis).  Allergic reactions to medicines. What happens before the procedure? Staying hydrated  Follow instructions from your health care provider about hydration, which may include:  Up to 2 hours before the procedure - you may continue to drink clear liquids, such as water, clear fruit juice, black coffee, and plain tea. Eating and drinking restrictions  Follow instructions from your health care provider about eating and drinking, which may include:  8 hours before the procedure - stop eating heavy meals or foods such as meat, fried foods, or fatty foods.  6 hours before the procedure - stop eating light meals or foods, such as toast or cereal.  6 hours before the procedure - stop drinking milk or drinks that contain milk.  2 hours before the procedure - stop drinking clear liquids. Medicines   Ask your health care provider about:  Changing or stopping your regular medicines. This is especially important if you are taking diabetes medicines or blood thinners.  Taking medicines such as aspirin and ibuprofen. These medicines can thin your blood. Do not take these medicines before your procedure if your health care provider instructs you not to.  You may be given antibiotic medicine to help prevent infection. General instructions   You will have a heart evaluation. This may include an electrocardiogram (ECG), chest X-ray, and heart imaging (echocardiogram,  or echo) tests.  You will have blood tests.  Do not use any products that contain nicotine or tobacco, such as cigarettes and e-cigarettes. If you need help quitting, ask your health care provider.  Plan to have someone take you home from the  hospital or clinic.  If you will be going home right after the procedure, plan to have someone with you for 24 hours.  Ask your health care provider how your surgical site will be marked or identified. What happens during the procedure?  To reduce your risk of infection:  Your health care team will wash or sanitize their hands.  Your skin will be washed with soap.  Hair may be removed from the surgical area.  An IV tube will be inserted into one of your veins.  You will be given one or more of the  following:  A medicine to help you relax (sedative).  A medicine to numb the area (local anesthetic).  A medicine to make you fall asleep (general anesthetic).  If you are getting a transvenous pacemaker:  An incision will be made in your upper chest.  A pocket will be made for the pacemaker. It may be placed under the skin or between layers of muscle.  The lead will be inserted into a blood vessel that returns to the heart.  While X-rays are taken by an imaging machine (fluoroscopy), the lead will be advanced through the vein to the inside of your heart.  The other end of the lead will be tunneled under the skin and attached to the pacemaker.  If you are getting an epicardial pacemaker:  An incision will be made near your ribs or breastbone (sternum) for the lead.  The lead will be attached to the outside of your heart.  Another incision will be made in your chest or upper belly to create a pocket for the pacemaker.  The free end of the lead will be tunneled under the skin and attached to the pacemaker.  The transvenous or epicardial pacemaker will be tested. Imaging studies may be done to check the lead position.  The incisions will be closed with stitches (sutures), adhesive strips, or skin glue.  Bandages (dressing) will be placed over the incisions. The procedure may vary among health care providers and hospitals. What happens after the procedure?  Your blood  pressure, heart rate, breathing rate, and blood oxygen level will be monitored until the medicines you were given have worn off.  You will be given antibiotics and pain medicine.  ECG and chest x-rays will be done.  You will wear a continuous type of ECG (Holter monitor) to check your heart rhythm.  Your health care provider willprogram the pacemaker.  Do not drive for 24 hours if you received a sedative. This information is not intended to replace advice given to you by your health care provider. Make sure you discuss any questions you have with your health care provider. Document Released: 09/01/2002 Document Revised: 03/31/2016 Document Reviewed: 02/23/2016 Elsevier Interactive Patient Education  2017 [employer].

## 2017-01-17 NOTE — Consult Note (Signed)
           [facility] CM Primary Care Navigator  01/17/2017  [name] 1950-10-02 [id number]   Went to see patient in the room to identify possible discharge needs but he was alreadydischarged.  Patient was discharged home today per staff.  Primary care provider's office called Darrell [name] for Darrell [name]) to notify of patient's discharge and need for post hospital follow-up and transition of care.  Made aware to refer patient to [facility] care management ifdeemed appropriatefor services.   For questions, please contact:  Dannielle Huh, BSN, RN- [facility] Primary Care Navigator  Telephone: [phone] [place]

## 2017-01-17 NOTE — Progress Notes (Signed)
Reviewed discharge instructions with patient and wife and they stated their understanding.  Reviewed arm limitations with patient and wife also, they stated their understanding.  Discharged home with wife via wheelchair.  Sanda Linger

## 2017-01-17 NOTE — Discharge Summary (Signed)
Physician Discharge Summary  Darrell [name] GUR:[id number] DOB: [birth year] DOA: 01/15/2017  PCP: Darrell Ends, MD  Admit date: 01/15/2017 Discharge date: 01/17/2017   Recommendations for Outpatient Follow-Up:   1. Cbc, bmp 1 week re increase ca- may need further work up for possible cancer   Discharge Diagnosis:   Active Problems:   HTN (hypertension)   Tobacco abuse   CKD (chronic kidney disease) stage 3, GFR 30-59 ml/min   Glaucoma   Bradycardia   Hypercalcemia   Heart block   Discharge disposition:  Home.    Discharge Condition: Improved.  Diet recommendation: Low sodium, heart healthy.  Carbohydrate-modified.  Wound care: None.   History of Present Illness:    Darrell [name] is a 66 y.o. male with medical history significant of  HTN, HLD, CKD stage III, tobacco abuse, and hypercalcemia; who presents from PCP office for heart rates in the 54s. Patient wife notes that since yesterday he is intermittently had heart rates in the 40s when checking blood pressures. He had a routine follow-up appointment with his primary care provider today and was noted to have heart rates in the 40s for which her EKG showed a 2:1 AV block. Of note patient had fallen 3 weeks ago after feeling dizzy/lightheaded and off balance. He suffered a contusion to his head requiring stitches. Other associated symptoms include note of chronic productive cough with white sputum over the last several weeks and some weight loss of approximately 5-10 pounds last few weeks. Otherwise denies any shortness of breath, chest pain, palpitations, leg swelling, nausea, vomiting, or diarrhea. He reports continued to smoke on a regular basis.   Hospital Course by Problem:   Mobitz II Doing well post pacemaker Device interrogation reviewed and normal CXR with leads in stable position, no obvious ptx Routine wound care and follow up  Tobacco abuse -encouraged cessation  HTN -continue home  meds  Hypercalcemia -outpatient follow up  Glaucoma -continue eye drops  h/o CVA  Medical Consultants:    EP/Cards   Discharge Exam:   Vitals:   01/17/17 0440 01/17/17 0840  BP: 137/85 (!) 155/92  Pulse: 71   Resp: 15   Temp: 98.5 F (36.9 C)    Vitals:   01/16/17 2100 01/16/17 2139 01/17/17 0440 01/17/17 0840  BP: (!) 171/100 (!) 153/96 137/85 (!) 155/92  Pulse:  84 71   Resp:  15 15   Temp:  98.3 F (36.8 C) 98.5 F (36.9 C)   TempSrc:  Oral Oral   SpO2:  97% 97%   Weight:   77.1 kg (169 lb 14.4 oz)   Height:        Gen:  NAD- feels better since pacemaker placed    The results of significant diagnostics from this hospitalization (including imaging, microbiology, ancillary and laboratory) are listed below for reference.     Procedures and Diagnostic Studies:   Dg Chest 2 View  Result Date: 01/15/2017 CLINICAL DATA:  Acute onset of bradycardia.  Initial encounter. EXAM: CHEST  2 VIEW COMPARISON:  Chest radiograph performed 08/04/2016, and CT of the chest performed 08/11/2016 FINDINGS: The lungs are well-aerated and clear. There is no evidence of focal opacification, pleural effusion or pneumothorax. The heart is normal in size; the mediastinal contour is within normal limits. No acute osseous abnormalities are seen. IMPRESSION: No acute cardiopulmonary process seen. Electronically Signed   By: Darrell [name] M.D.   On: 01/15/2017 19:51     Labs:   Basic  Metabolic Panel:  Recent Labs Lab 01/15/17 1704 01/15/17 2348  NA 141 139  K 5.1 3.9  CL 104 107  CO2 29 24  GLUCOSE 102* 141*  BUN 26* 25*  CREATININE 1.99* 2.08*  CALCIUM 10.7* 10.0   GFR Estimated Creatinine Clearance: 39.1 mL/min (A) (by C-G formula based on SCr of 2.08 mg/dL (H)). Liver Function Tests:  Recent Labs Lab 01/15/17 2348  AST 12*  ALT 9*  ALKPHOS 80  BILITOT 0.7  PROT 6.5  ALBUMIN 3.5   No results for input(s): LIPASE, AMYLASE in the last 168 hours. No results for  input(s): AMMONIA in the last 168 hours. Coagulation profile No results for input(s): INR, PROTIME in the last 168 hours.  CBC:  Recent Labs Lab 01/15/17 1704 01/15/17 2348  WBC 9.8 10.3  HGB 14.1 13.2  HCT 43.7 40.7  MCV 87.6 86.8  PLT 293 281   Cardiac Enzymes: No results for input(s): CKTOTAL, CKMB, CKMBINDEX, TROPONINI in the last 168 hours. BNP: Invalid input(s): POCBNP CBG: No results for input(s): GLUCAP in the last 168 hours. D-Dimer No results for input(s): DDIMER in the last 72 hours. Hgb A1c No results for input(s): HGBA1C in the last 72 hours. Lipid Profile No results for input(s): CHOL, HDL, LDLCALC, TRIG, CHOLHDL, LDLDIRECT in the last 72 hours. Thyroid function studies  Recent Labs  01/15/17 2348  TSH 1.606   Anemia work up No results for input(s): VITAMINB12, FOLATE, FERRITIN, TIBC, IRON, RETICCTPCT in the last 72 hours. Microbiology No results found for this or any previous visit (from the past 240 hour(s)).   Discharge Instructions:   Discharge Instructions    Diet - low sodium heart healthy    Complete by:  As directed    Discharge instructions    Complete by:  As directed    Restrictions per Dr. Curt [name]   Increase activity slowly    Complete by:  As directed      Allergies as of 01/17/2017      Reactions   Ace Inhibitors Other (See Comments)   Hyperkalemia    Doxycycline Other (See Comments)   Hiccups, cough, nausea and emesis, elevated liver enzymes, elevated eosinophils, SOB concerning for early DRESS syndrome    Atacand Hct [candesartan Cilexetil-hctz] Hives   Shellfish Allergy Hives      Medication List    STOP taking these medications   diclofenac sodium 1 % Gel Commonly known as:  VOLTAREN   mirtazapine 7.5 MG tablet Commonly known as:  REMERON   polyethylene glycol powder powder Commonly known as:  GLYCOLAX/MIRALAX     TAKE these medications   amLODipine 10 MG tablet Commonly known as:  NORVASC Take 1 tablet (10  mg total) by mouth every morning.   aspirin EC 81 MG tablet Take 1 tablet (81 mg total) by mouth daily.   atorvastatin 40 MG tablet Commonly known as:  LIPITOR Take 1 tablet (40 mg total) by mouth daily.   brimonidine 0.1 % Soln Commonly known as:  ALPHAGAN P Place 1 drop into both eyes 2 (two) times daily.   DAILY VITAMIN FORMULA+IRON Tabs Take 1 tablet by mouth daily.   dorzolamide-timolol 22.3-6.8 MG/ML ophthalmic solution Commonly known as:  COSOPT Place 1 drop into both eyes 2 (two) times daily.   EQ DAILY FIBER PO Take 1 tablet by mouth daily.   fluticasone 50 MCG/ACT nasal spray Commonly known as:  FLONASE Place 2 sprays into both nostrils daily.   hydrALAZINE 50 MG  tablet Commonly known as:  APRESOLINE Take 1 tablet (50 mg total) by mouth 3 (three) times daily.   ketoconazole 2 % cream Commonly known as:  NIZORAL Apply 1 application topically 2 (two) times daily.   latanoprost 0.005 % ophthalmic solution Commonly known as:  XALATAN Place 1 drop into both eyes at bedtime.   torsemide 20 MG tablet Commonly known as:  DEMADEX Take 20 mg by mouth daily.            Durable Medical Equipment        Start     Ordered   01/17/17 0900  For home use only DME Tub bench  Once     01/17/17 0859     Follow-up Information    [place] [facility] Follow up on 01/31/2017.   Specialty:  Cardiology Why:  10:30AM, wound check Contact information: [address] [place]       Will Meredith Leeds, MD Follow up on 04/17/2017.   Specialty:  Cardiology Why:  11:00AM Contact information: [place] [address] [phone]        Darrell Ends, MD Follow up.   Specialty:  Family Medicine Contact information: [place] [address] [phone]            Time coordinating discharge: 35 min  Signed:  Joshawa Dubin U Waver Dibiasio   [facility] 01/17/2017, 1:08 PM

## 2017-01-17 NOTE — Progress Notes (Signed)
Electrophysiology Rounding Note  Patient Name: Darrell [name] Date of Encounter: 01/17/2017  Electrophysiologist: Curt Bears (new this admission)   Subjective   The patient is doing well today.  At this time, the patient denies chest pain, shortness of breath, or any new concerns.  Inpatient Medications    Scheduled Meds: . amLODipine  10 mg Oral q morning - 10a  . aspirin EC  81 mg Oral Daily  . atorvastatin  40 mg Oral Daily  . brimonidine  1 drop Both Eyes BID  . dorzolamide-timolol  1 drop Both Eyes BID  . fluticasone  2 spray Each Nare Daily  . hydrALAZINE  50 mg Oral TID  . latanoprost  1 drop Both Eyes QHS  . nicotine  21 mg Transdermal Daily  . torsemide  20 mg Oral Daily   Continuous Infusions: . sodium chloride 75 mL/hr at 01/16/17 2104   PRN Meds: acetaminophen **OR** acetaminophen, ipratropium-albuterol, ondansetron (ZOFRAN) IV, ondansetron **OR** [DISCONTINUED] ondansetron (ZOFRAN) IV   Vital Signs    Vitals:   01/16/17 1815 01/16/17 2100 01/16/17 2139 01/17/17 0440  BP: (!) 161/96 (!) 171/100 (!) 153/96 137/85  Pulse: 83  84 71  Resp: 19  15 15   Temp:   98.3 F (36.8 C) 98.5 F (36.9 C)  TempSrc:   Oral Oral  SpO2: 98%  97% 97%  Weight:    169 lb 14.4 oz (77.1 kg)  Height:        Intake/Output Summary (Last 24 hours) at 01/17/17 0738 Last data filed at 01/17/17 0400  Gross per 24 hour  Intake             2209 ml  Output              550 ml  Net             1659 ml   Filed Weights   01/15/17 2315 01/16/17 0300 01/17/17 0440  Weight: 166 lb 8 oz (75.5 kg) 166 lb 14.4 oz (75.7 kg) 169 lb 14.4 oz (77.1 kg)    Physical Exam    GEN- The patient is well appearing, alert and oriented x 3 today.   Head- normocephalic, atraumatic Eyes-  Sclera clear, conjunctiva pink Ears- hearing intact Oropharynx- clear Neck- supple Lungs- Clear to ausculation bilaterally, normal work of breathing Heart- Regular rate and rhythm, no murmurs, rubs or  gallops GI- soft, NT, ND, + BS Extremities- no clubbing, cyanosis, or edema Skin- no rash or lesion Psych- euthymic mood, full affect Neuro- strength and sensation are intact  Labs    CBC  Recent Labs  01/15/17 1704 01/15/17 2348  WBC 9.8 10.3  HGB 14.1 13.2  HCT 43.7 40.7  MCV 87.6 86.8  PLT 293 696   Basic Metabolic Panel  Recent Labs  01/15/17 1704 01/15/17 2348  NA 141 139  K 5.1 3.9  CL 104 107  CO2 29 24  GLUCOSE 102* 141*  BUN 26* 25*  CREATININE 1.99* 2.08*  CALCIUM 10.7* 10.0   Liver Function Tests  Recent Labs  01/15/17 2348  AST 12*  ALT 9*  ALKPHOS 80  BILITOT 0.7  PROT 6.5  ALBUMIN 3.5   Thyroid Function Tests  Recent Labs  01/15/17 2348  TSH 1.606    Telemetry    Sinus rhythm with V pacing  (personally reviewed)  Radiology    Dg Chest 2 View  Result Date: 01/15/2017 CLINICAL DATA:  Acute onset of bradycardia.  Initial encounter.  EXAM: CHEST  2 VIEW COMPARISON:  Chest radiograph performed 08/04/2016, and CT of the chest performed 08/11/2016 FINDINGS: The lungs are well-aerated and clear. There is no evidence of focal opacification, pleural effusion or pneumothorax. The heart is normal in size; the mediastinal contour is within normal limits. No acute osseous abnormalities are seen. IMPRESSION: No acute cardiopulmonary process seen. Electronically Signed   By: Garald Balding M.D.   On: 01/15/2017 19:51     Patient Profile     CANNON ARREOLA is a 66 y.o. male with a past medical history significant for HTN, CKD, and prior stroke.  He was admitted for with bradycardia and found to be in 2:1 heart block.   Assessment & Plan    1.  Mobitz II Doing well post pacemaker Device interrogation reviewed and normal CXR with leads in stable position, no obvious ptx Routine wound care and follow up  [facility] from EP standpoint to discharge. Appts/instructions entered in AVS Electrophysiology team to see as needed while here. Please call with  questions.  Signed, Chanetta Marshall, NP  01/17/2017, 7:38 AM   I have seen and examined this patient with Chanetta Marshall.  Agree with above, note added to reflect my findings.  On exam, RRR, no murmurs, lungs clear. Had pacemaker placed yesterday for Mobitz II AV block. interrogation and CXR without issues. Plan for discharge when medically stable per primary team.    Corneilus Heggie M. Aamiyah Derrick MD 01/17/2017 7:43 AM

## 2017-01-18 ENCOUNTER — Telehealth: Payer: Self-pay | Admitting: Family Medicine

## 2017-01-18 NOTE — Telephone Encounter (Signed)
Plum, called to informed DR. Funches and [facility] that the PT has been discharge from the hospital and, he need a follow up appt, since he has falling 3 time in the last week and has lost his balance, please follow up with PT to schedule appt

## 2017-01-19 ENCOUNTER — Telehealth: Payer: Self-pay

## 2017-01-19 NOTE — Telephone Encounter (Signed)
Call placed to Dannielle Huh, RN Margie Billet informing her that the patient has an appointment with Dr Adrian Blackwater on 01/29/17 @ 0945.

## 2017-01-24 ENCOUNTER — Telehealth: Payer: Self-pay | Admitting: Family Medicine

## 2017-01-24 DIAGNOSIS — I10 Essential (primary) hypertension: Secondary | ICD-10-CM

## 2017-01-24 DIAGNOSIS — I6930 Unspecified sequelae of cerebral infarction: Secondary | ICD-10-CM

## 2017-01-24 DIAGNOSIS — Z9181 History of falling: Secondary | ICD-10-CM

## 2017-01-24 NOTE — Telephone Encounter (Signed)
Will route to PCP 

## 2017-01-24 NOTE — Telephone Encounter (Signed)
Pt calling to request orders for a shower chair and also request authorization for physical therapy as pt's wife said that pt fell not long ago and hit his head and has since been dragging his feet. Requests a call to advise of shower chair. Thank you.

## 2017-01-25 ENCOUNTER — Telehealth: Payer: Self-pay | Admitting: Family Medicine

## 2017-01-25 NOTE — Telephone Encounter (Addendum)
Late entry: At 11:38, phone call made to given phone number in attempt to get BP readings. Left message on VM.   Another attempt made: spoke to patient and wife. Informed that [employer] wanted MD to be aware of BP's that was taken. Today BP readings are:  167/102, HR: 73 156/95, HR: 76  BP's have been over 140 last few days, 178/98 a few days ago.   Diet addressed: Pt wife states he has been eating pork, last 3 days.  Has some salt in diet  Nephrologist inquired about lisinopril and not being on this medications.  Pt is taking torsemide,hydralazine, amlodipine   Denies swelling, chest pain, HA, blurred vision, SOB.   Pt advised not to eat pork, decrease salt intake, and walk daily.

## 2017-01-25 NOTE — Telephone Encounter (Signed)
Caller calling to give bp readings for pt. Call pt's wife at [phone]. Thank you.

## 2017-01-26 MED ORDER — LISINOPRIL 20 MG PO TABS: 20.0000 mg | ORAL_TABLET | Freq: Every day | ORAL | 1 refills | Status: DC

## 2017-01-26 NOTE — Telephone Encounter (Signed)
Called to patient's wife She reports patient is doing well She is out at grocery store Patient shopping  BP elevated Plan: Restart lisinopril at 20 mg daily Referral to PT (ambulatory requested per Mrs. Owens Shark) sent Rx for shower chair will be ready for pick up at appointment on 01/29/2017

## 2017-01-26 NOTE — Telephone Encounter (Signed)
Reviewed Patient's wife called Lisinopril restarted at 20 mg daily

## 2017-01-29 ENCOUNTER — Telehealth: Payer: Self-pay | Admitting: Family Medicine

## 2017-01-29 ENCOUNTER — Telehealth: Payer: Self-pay

## 2017-01-29 ENCOUNTER — Ambulatory Visit: Payer: Medicare PPO | Attending: Family Medicine | Admitting: Family Medicine

## 2017-01-29 ENCOUNTER — Encounter: Payer: Self-pay | Admitting: Family Medicine

## 2017-01-29 ENCOUNTER — Other Ambulatory Visit: Payer: Self-pay

## 2017-01-29 VITALS — BP 142/65 | HR 83 | Temp 97.8°F | Ht 76.0 in | Wt 166.8 lb

## 2017-01-29 DIAGNOSIS — R21 Rash and other nonspecific skin eruption: Secondary | ICD-10-CM | POA: Diagnosis not present

## 2017-01-29 DIAGNOSIS — Z950 Presence of cardiac pacemaker: Secondary | ICD-10-CM

## 2017-01-29 DIAGNOSIS — I10 Essential (primary) hypertension: Secondary | ICD-10-CM

## 2017-01-29 DIAGNOSIS — F17210 Nicotine dependence, cigarettes, uncomplicated: Secondary | ICD-10-CM | POA: Insufficient documentation

## 2017-01-29 DIAGNOSIS — Z8673 Personal history of transient ischemic attack (TIA), and cerebral infarction without residual deficits: Secondary | ICD-10-CM | POA: Diagnosis not present

## 2017-01-29 DIAGNOSIS — N2889 Other specified disorders of kidney and ureter: Secondary | ICD-10-CM | POA: Diagnosis not present

## 2017-01-29 DIAGNOSIS — Z09 Encounter for follow-up examination after completed treatment for conditions other than malignant neoplasm: Secondary | ICD-10-CM | POA: Diagnosis not present

## 2017-01-29 DIAGNOSIS — Z7982 Long term (current) use of aspirin: Secondary | ICD-10-CM | POA: Diagnosis not present

## 2017-01-29 DIAGNOSIS — N183 Chronic kidney disease, stage 3 unspecified: Secondary | ICD-10-CM

## 2017-01-29 DIAGNOSIS — I129 Hypertensive chronic kidney disease with stage 1 through stage 4 chronic kidney disease, or unspecified chronic kidney disease: Secondary | ICD-10-CM | POA: Diagnosis not present

## 2017-01-29 DIAGNOSIS — B353 Tinea pedis: Secondary | ICD-10-CM | POA: Insufficient documentation

## 2017-01-29 DIAGNOSIS — I441 Atrioventricular block, second degree: Secondary | ICD-10-CM | POA: Insufficient documentation

## 2017-01-29 DIAGNOSIS — R634 Abnormal weight loss: Secondary | ICD-10-CM | POA: Diagnosis not present

## 2017-01-29 DIAGNOSIS — Z0000 Encounter for general adult medical examination without abnormal findings: Secondary | ICD-10-CM | POA: Diagnosis not present

## 2017-01-29 DIAGNOSIS — R001 Bradycardia, unspecified: Secondary | ICD-10-CM | POA: Diagnosis not present

## 2017-01-29 DIAGNOSIS — Z720 Tobacco use: Secondary | ICD-10-CM

## 2017-01-29 HISTORY — DX: Presence of cardiac pacemaker: Z95.0

## 2017-01-29 MED ORDER — TERBINAFINE HCL 1 % EX CREA
1.0000 | TOPICAL_CREAM | Freq: Two times a day (BID) | CUTANEOUS | 0 refills | Status: DC
Start: 2017-01-29 — End: 2019-05-21

## 2017-01-29 MED ORDER — VARENICLINE TARTRATE 1 MG PO TABS: 1.0000 mg | ORAL_TABLET | Freq: Two times a day (BID) | ORAL | 2 refills | Status: DC

## 2017-01-29 MED ORDER — TERBINAFINE HCL 1 % EX CREA: 1.0000 "application " | TOPICAL_CREAM | Freq: Two times a day (BID) | CUTANEOUS | 0 refills | Status: DC

## 2017-01-29 MED ORDER — VARENICLINE TARTRATE 0.5 MG X 11 & 1 MG X 42 PO MISC: ORAL | 0 refills | Status: DC

## 2017-01-29 MED ORDER — LISINOPRIL 20 MG PO TABS: 20.0000 mg | ORAL_TABLET | Freq: Every day | ORAL | 1 refills | Status: DC

## 2017-01-29 MED FILL — LISINOPRIL 20 MG TABLET: 20 | 30 days supply | Qty: 30 | Fill #0

## 2017-01-29 NOTE — Telephone Encounter (Signed)
Darrell [name] is returning your call abot PT, she nned to talk to the nurse please call her back [phone]

## 2017-01-29 NOTE — Assessment & Plan Note (Addendum)
Unintentional weight loss in smoker with hx of CVA Mild hypercalcemia No nodules or CXR Patient has never had c-scope  Plan: GI referral for c-scope Abdominal MRI without and with contrast to evaluate kidney mass

## 2017-01-29 NOTE — Assessment & Plan Note (Signed)
A: ongoing tobacco abuse, HTN, post stroke  P:  chantix discussed and patient willing to try.

## 2017-01-29 NOTE — Assessment & Plan Note (Signed)
A: improved near goal Med: compliant Patient on torsemide 20 mg daily and had restarted lisinopril 20 mg daily Will f/u in 11 days for labs-BMP with GFR

## 2017-01-29 NOTE — Assessment & Plan Note (Deleted)
A: patient is s/p

## 2017-01-29 NOTE — Assessment & Plan Note (Signed)
f/u MRI with contrast ordered Patient now has pacemaker so awaiting whether or not contrast MRI is compatible with his pacemaker

## 2017-01-29 NOTE — Patient Instructions (Signed)
Darrell [name] was seen today for follow-up.  Diagnoses and all orders for this visit:  CKD (chronic kidney disease) stage 3, GFR 30-59 ml/min  Essential hypertension -     lisinopril (PRINIVIL,ZESTRIL) 20 MG tablet; Take 1 tablet (20 mg total) by mouth daily. -     BASIC METABOLIC PANEL WITH GFR; Future  Tinea pedis of right foot -     terbinafine (LAMISIL AT) 1 % cream; Apply 1 application topically 2 (two) times daily. To R foot  Healthcare maintenance -     Ambulatory referral to Gastroenterology  Renal mass, right -     MR Abdomen W Wo Contrast; Future  due for f/u MRI to check on small mass in right kidney noted in 11.2017  f/u in 11 days for labs work, BMP with GFR to check kidney functio and potssium level  f/u with me in 3 weeks for HTN   Dr. Adrian Blackwater

## 2017-01-29 NOTE — Assessment & Plan Note (Signed)
Could not afford nizoral cream Ordered lamisil cream

## 2017-01-29 NOTE — Progress Notes (Signed)
Subjective:  Patient ID: Darrell [name], male    DOB: [birth year]  Age: 66 y.o. MRN: [id number]  CC: Follow-up   HPI EVERETT EHRLER has hx of CVA (09/2012), HTN, CKD stage 3, heart block s/p pacemaker placement he presents with his wife for    1. Heart block/bradycardia: at last office visit he had asymptomatic bradycardia with HR 44, that started on 01/12/2017. EKG revealed second degree heart block. He was evaluated in the ED. Troponins were negative, CXR was negative for acute findings, labs revealed baseline CKD, potassium of 5.1, normal TSH, normal CBC, screening HIV negative, slightly elevated calcium and ionized calcium of 10.7/5. He was hospitalized from 4/23-4/25/18 . He underwent pacemaker placement on 01/16/2017.  Today, he denies chest pain, dizziness, syncope. He reports slight L upper shoulder soreness. No fever or chills.   2.  HTN: taking hydralazine 50 mg TID, also amlodpine 10 mg daily. Torsemide 20 mg daily. He restarted lisinopril 20 mg daily on 01/27/2017. He continues to smokes about 1 PPD. He partially desire to quit but he also quite enjoys smoking. No ETOH. He wears a 21 mg nicotine patch but removes it to smoke.   Note his linisopril has been stopped and restarted twice over the past year. It was stopped on 11/25/2015 due to hyperkalemia in setting of acute on chronic kidney disease. He was restarted sometime later. It was stopped again in11/2017 to to AKI not with hyperkalemia caused by doxycyline induced DRESS syndrome.   3. R foot rash: non pruritic. Non swelling. He could not afford nizoral cream, $10 co-pay. He is using triple antibiotic cream with improvement.   4. Weight loss: he continues to lose weight. His baseline weight is between 180-190 #. He reports slight decrease in appetite. Denies fever, chills, night sweats, nausea, vomiting and diarrhea. He has never had screening colonoscopy. Of note he has known bilateral kidney masses: incidental finding noted on CT  abdomen w/o contrast on 08/11/2016. f/u MRI without contrast on 08/14/16:  Several indeterminate lesions in both kidneys, largest measuring 1.5 cm in anterior upper pole of right kidney and 2.1 cm in posterior upper pole of left kidney. These cannot be characterized without IV contrast, and solid renal neoplasms cannot be excluded. Additional indeterminate Bosniak category 6F cystic lesion in lower pole of left kidney. Recommend followup with abdomen MRI without and with contrast in 6 months (patient may receive gadolinium IV contrast with eGFR of 30 or higher).  Small benign hepatic hemangiomas and/or cysts.  Past Surgical History:  Procedure Laterality Date  . EYE SURGERY    . INGUINAL HERNIA REPAIR Right 11/23/2014   Procedure: right inguinal hernia repair with mesh;  Surgeon: Armandina Gemma, MD;  Location: [facility];  Service: General;  Laterality: Right;  . INSERTION OF MESH N/A 11/23/2014   Procedure: INSERTION OF MESH;  Surgeon: Armandina Gemma, MD;  Location: [facility];  Service: General;  Laterality: N/A;  . PACEMAKER IMPLANT N/A 01/16/2017   Procedure: Pacemaker Implant;  Surgeon: Will Meredith Leeds, MD;  Location: [facility];  Service: Cardiovascular;  Laterality: N/A;  . SHOULDER SURGERY Bilateral 1988, 1998    Social History  Substance Use Topics  . Smoking status: Current Every Day Smoker    Packs/day: 1.00    Years: 40.00    Types: Cigarettes  . Smokeless tobacco: Never Used  . Alcohol use Yes     Comment: alcohol free for 1 year was drinking 1/2 pint per day  Outpatient Medications Prior to Visit  Medication Sig Dispense Refill  . amLODipine (NORVASC) 10 MG tablet Take 1 tablet (10 mg total) by mouth every morning. 90 tablet 2  . aspirin EC 81 MG tablet Take 1 tablet (81 mg total) by mouth daily. 90 tablet 3  . atorvastatin (LIPITOR) 40 MG tablet Take 1 tablet (40 mg total) by mouth daily. 90 tablet 3  . brimonidine (ALPHAGAN P) 0.1 % SOLN Place 1 drop into both  eyes 2 (two) times daily.    . dorzolamide-timolol (COSOPT) 22.3-6.8 MG/ML ophthalmic solution Place 1 drop into both eyes 2 (two) times daily.    . fluticasone (FLONASE) 50 MCG/ACT nasal spray Place 2 sprays into both nostrils daily. 16 g 6  . hydrALAZINE (APRESOLINE) 50 MG tablet Take 1 tablet (50 mg total) by mouth 3 (three) times daily. 90 tablet 5  . ketoconazole (NIZORAL) 2 % cream Apply 1 application topically 2 (two) times daily. 30 g 0  . latanoprost (XALATAN) 0.005 % ophthalmic solution Place 1 drop into both eyes at bedtime.    Marland Kitchen lisinopril (PRINIVIL,ZESTRIL) 20 MG tablet Take 1 tablet (20 mg total) by mouth daily. 30 tablet 1  . Multiple Vitamins-Iron (DAILY VITAMIN FORMULA+IRON) TABS Take 1 tablet by mouth daily.    . Psyllium (EQ DAILY FIBER PO) Take 1 tablet by mouth daily.    Marland Kitchen torsemide (DEMADEX) 20 MG tablet Take 20 mg by mouth daily.     No facility-administered medications prior to visit.     ROS Review of Systems  Constitutional: Positive for appetite change and unexpected weight change. Negative for chills, fatigue and fever.  HENT: Negative for congestion, rhinorrhea, sore throat, trouble swallowing and voice change.   Eyes: Positive for visual disturbance (blind in R eye ).  Respiratory: Negative for cough and shortness of breath.   Cardiovascular: Negative for chest pain, palpitations and leg swelling.  Gastrointestinal: Negative for abdominal pain, blood in stool, constipation, diarrhea, nausea and vomiting.  Endocrine: Negative for polydipsia, polyphagia and polyuria.  Musculoskeletal: Positive for arthralgias. Negative for back pain, gait problem, joint swelling, myalgias and neck pain.  Skin: Negative for rash.  Allergic/Immunologic: Negative for immunocompromised state.  Neurological: Positive for speech difficulty and weakness (in lower extermities, L >R ). Negative for light-headedness and headaches.  Hematological: Negative for adenopathy. Does not  bruise/bleed easily.  Psychiatric/Behavioral: Negative for dysphoric mood, sleep disturbance and suicidal ideas. The patient is not nervous/anxious.     Objective:  BP (!) 142/65   Pulse 83   Temp 97.8 F (36.6 C) (Oral)   Ht 6' 4"  (1.93 m)   Wt 166 lb 12.8 oz (75.7 kg)   SpO2 99%   BMI 20.30 kg/m   BP/Weight 01/29/2017 01/17/2017 10/10/5788  Systolic BP 383 338 -  Diastolic BP 65 92 -  Wt. (Lbs) 166.8 169.9 -  BMI 20.3 - 21.24   Wt Readings from Last 3 Encounters:  01/29/17 166 lb 12.8 oz (75.7 kg)  01/17/17 169 lb 14.4 oz (77.1 kg)  01/15/17 172 lb 6.4 oz (78.2 kg)    Physical Exam  Constitutional: He appears well-developed and well-nourished. No distress.  Thin adult male  Sitting in wheelchair   HENT:  Head: Normocephalic and atraumatic.  Eyes: Conjunctivae and EOM are normal. Right pupil is not reactive. Right pupil is round. Left pupil is round and reactive.  Neck: Normal range of motion. Neck supple.    Cardiovascular: Normal rate, regular rhythm, normal heart sounds  and intact distal pulses.   Pulmonary/Chest: Effort normal and breath sounds normal.  Musculoskeletal: He exhibits no edema.  Neurological: He is alert. No cranial nerve deficit. Gait abnormal.  Decreased strength in legs   Skin: Skin is warm and dry. No rash noted. No erythema.     Psychiatric: He has a normal mood and affect.     Chemistry      Component Value Date/Time   NA 139 01/15/2017 2348   NA 142 03/30/2014 1538   K 3.9 01/15/2017 2348   CL 107 01/15/2017 2348   CO2 24 01/15/2017 2348   BUN 25 (H) 01/15/2017 2348   BUN 33 (H) 03/30/2014 1538   CREATININE 2.08 (H) 01/15/2017 2348   CREATININE 1.45 (H) 08/10/2016 1647      Component Value Date/Time   CALCIUM 10.0 01/15/2017 2348   ALKPHOS 80 01/15/2017 2348   AST 12 (L) 01/15/2017 2348   ALT 9 (L) 01/15/2017 2348   BILITOT 0.7 01/15/2017 2348        Assessment & Plan:  Orbin was seen today for follow-up.  Diagnoses and  all orders for this visit:  CKD (chronic kidney disease) stage 3, GFR 30-59 ml/min  Essential hypertension -     lisinopril (PRINIVIL,ZESTRIL) 20 MG tablet; Take 1 tablet (20 mg total) by mouth daily. -     BASIC METABOLIC PANEL WITH GFR; Future  Tinea pedis of right foot -     terbinafine (LAMISIL AT) 1 % cream; Apply 1 application topically 2 (two) times daily. To R foot  Healthcare maintenance -     Ambulatory referral to Gastroenterology  Renal mass, right -     MR Abdomen W Wo Contrast; Future  Status cardiac pacemaker  Tobacco abuse -     varenicline (CHANTIX STARTING MONTH PAK) 0.5 MG X 11 & 1 MG X 42 tablet; Take by mouth, Taper per packet insert -     varenicline (CHANTIX CONTINUING MONTH PAK) 1 MG tablet; Take 1 tablet (1 mg total) by mouth 2 (two) times daily.  Unintended weight loss -     CBC with Differential; Future -     Calcium, ionized; Future   There are no diagnoses linked to this encounter.  No orders of the defined types were placed in this encounter.   Follow-up: Return in about 11 days (around 02/09/2017) for labs.   Boykin Nearing MD

## 2017-01-29 NOTE — Telephone Encounter (Signed)
Call placed to Dannielle Huh, RN/THN notifying her that Dr Adrian Blackwater saw the patient today and did not think that a [facility] referral is necessary at this point as the patient is well connected with community services.

## 2017-01-30 ENCOUNTER — Telehealth: Payer: Self-pay | Admitting: Family Medicine

## 2017-01-30 NOTE — Telephone Encounter (Signed)
PT wife called, she need to speak with the PCP since she is concern bout a note on the discharge papers from yesterday, please follow up

## 2017-01-31 ENCOUNTER — Ambulatory Visit (INDEPENDENT_AMBULATORY_CARE_PROVIDER_SITE_OTHER): Payer: Medicare PPO | Admitting: *Deleted

## 2017-01-31 DIAGNOSIS — I459 Conduction disorder, unspecified: Secondary | ICD-10-CM

## 2017-01-31 LAB — CUP PACEART INCLINIC DEVICE CHECK
Battery Remaining Longevity: 139 mo
Brady Statistic AP VP Percent: 1.14 %
Brady Statistic AP VS Percent: 0.08 %
Brady Statistic AS VP Percent: 96.83 %
Brady Statistic RA Percent Paced: 1.67 %
Brady Statistic RV Percent Paced: 97.97 %
Date Time Interrogation Session: 20180509105450
Implantable Lead Implant Date: 20180424
Implantable Lead Implant Date: 20180424
Implantable Lead Location: 753859
Implantable Lead Model: 5076
Lead Channel Impedance Value: 361 Ohm
Lead Channel Impedance Value: 418 Ohm
Lead Channel Pacing Threshold Amplitude: 0.75 V
Lead Channel Pacing Threshold Pulse Width: 0.4 ms
Lead Channel Sensing Intrinsic Amplitude: 20 mV
Lead Channel Setting Pacing Amplitude: 3.5 V
Lead Channel Setting Sensing Sensitivity: 2 mV
MDC IDC LEAD LOCATION: 753860
MDC IDC MSMT BATTERY VOLTAGE: 3.2 V
MDC IDC MSMT LEADCHNL RA IMPEDANCE VALUE: 304 Ohm
MDC IDC MSMT LEADCHNL RA SENSING INTR AMPL: 0.8 mV
MDC IDC MSMT LEADCHNL RV IMPEDANCE VALUE: 532 Ohm
MDC IDC MSMT LEADCHNL RV PACING THRESHOLD AMPLITUDE: 0.75 V
MDC IDC MSMT LEADCHNL RV PACING THRESHOLD PULSEWIDTH: 0.4 ms
MDC IDC PG IMPLANT DT: 20180424
MDC IDC SET LEADCHNL RV PACING AMPLITUDE: 3.5 V
MDC IDC SET LEADCHNL RV PACING PULSEWIDTH: 0.4 ms
MDC IDC STAT BRADY AS VS PERCENT: 1.95 %

## 2017-01-31 NOTE — Progress Notes (Signed)
Wound check appointment. Steri-strips removed. Wound without redness or edema. Incision edges approximated, wound well healed. Normal device function. Thresholds, RV sensing, and impedances consistent with implant measurements. P waves measured at 0.78mV this session, implant measurement was 3.52mV, programmed sensitivity is at 0.89mV-- per [facility] continue to monitor trend, no changes made this session. Device programmed at 3.5V for extra safety margin until 3 month visit. Histogram distribution appropriate for patient and level of activity. No mode switches or high ventricular rates noted. Patient educated about wound care, arm mobility, lifting restrictions. ROV with WC 04/17/17.

## 2017-01-31 NOTE — Telephone Encounter (Signed)
Pt was called and a Vm was left informing pt to return phone call.

## 2017-02-05 MED FILL — KETOCONAZOLE 2% CREAM: 2 | 10 days supply | Qty: 30 | Fill #0

## 2017-02-07 ENCOUNTER — Encounter: Payer: Self-pay | Admitting: Family Medicine

## 2017-02-09 ENCOUNTER — Ambulatory Visit: Payer: Medicare PPO | Attending: Family Medicine

## 2017-02-09 DIAGNOSIS — I10 Essential (primary) hypertension: Secondary | ICD-10-CM | POA: Diagnosis present

## 2017-02-09 DIAGNOSIS — R634 Abnormal weight loss: Secondary | ICD-10-CM | POA: Insufficient documentation

## 2017-02-09 NOTE — Progress Notes (Signed)
Patient here for lab visit only 

## 2017-02-10 LAB — BMP8+EGFR
BUN / CREAT RATIO: 13 (ref 10–24)
BUN: 28 mg/dL — ABNORMAL HIGH (ref 8–27)
CO2: 24 mmol/L (ref 18–29)
CREATININE: 2.11 mg/dL — AB (ref 0.76–1.27)
Calcium: 10.5 mg/dL — ABNORMAL HIGH (ref 8.6–10.2)
Chloride: 98 mmol/L (ref 96–106)
GFR calc Af Amer: 37 mL/min/{1.73_m2} — ABNORMAL LOW (ref >59)
GFR, EST NON AFRICAN AMERICAN: 32 mL/min/{1.73_m2} — AB (ref >59)
GLUCOSE: 123 mg/dL — AB (ref 65–99)
Potassium: 4.3 mmol/L (ref 3.5–5.2)
Sodium: 138 mmol/L (ref 134–144)

## 2017-02-10 LAB — CBC WITH DIFFERENTIAL/PLATELET
Basophils Absolute: 0 10*3/uL (ref 0.0–0.2)
Basos: 1 %
EOS (ABSOLUTE): 0.4 10*3/uL (ref 0.0–0.4)
EOS: 5 %
HEMATOCRIT: 39.7 % (ref 37.5–51.0)
HEMOGLOBIN: 13.3 g/dL (ref 13.0–17.7)
IMMATURE GRANULOCYTES: 0 %
Immature Grans (Abs): 0 10*3/uL (ref 0.0–0.1)
LYMPHS ABS: 2.1 10*3/uL (ref 0.7–3.1)
Lymphs: 28 %
MCH: 28.3 pg (ref 26.6–33.0)
MCHC: 33.5 g/dL (ref 31.5–35.7)
MCV: 85 fL (ref 79–97)
MONOCYTES: 7 %
Monocytes Absolute: 0.5 10*3/uL (ref 0.1–0.9)
NEUTROS PCT: 59 %
Neutrophils Absolute: 4.6 10*3/uL (ref 1.4–7.0)
Platelets: 309 10*3/uL (ref 150–379)
RBC: 4.7 x10E6/uL (ref 4.14–5.80)
RDW: 15.5 % — ABNORMAL HIGH (ref 12.3–15.4)
WBC: 7.6 10*3/uL (ref 3.4–10.8)

## 2017-02-10 LAB — CALCIUM, IONIZED: Calcium, Ion: 5.6 mg/dL (ref 4.5–5.6)

## 2017-02-13 ENCOUNTER — Ambulatory Visit: Payer: Medicare PPO

## 2017-02-20 ENCOUNTER — Ambulatory Visit: Payer: Medicare PPO | Attending: Family Medicine | Admitting: Family Medicine

## 2017-02-20 ENCOUNTER — Encounter: Payer: Self-pay | Admitting: Family Medicine

## 2017-02-20 VITALS — BP 120/67 | HR 91 | Temp 98.1°F | Wt 175.6 lb

## 2017-02-20 DIAGNOSIS — Z950 Presence of cardiac pacemaker: Secondary | ICD-10-CM | POA: Insufficient documentation

## 2017-02-20 DIAGNOSIS — Z7982 Long term (current) use of aspirin: Secondary | ICD-10-CM | POA: Diagnosis not present

## 2017-02-20 DIAGNOSIS — Z8673 Personal history of transient ischemic attack (TIA), and cerebral infarction without residual deficits: Secondary | ICD-10-CM | POA: Insufficient documentation

## 2017-02-20 DIAGNOSIS — I6930 Unspecified sequelae of cerebral infarction: Secondary | ICD-10-CM | POA: Diagnosis not present

## 2017-02-20 DIAGNOSIS — I129 Hypertensive chronic kidney disease with stage 1 through stage 4 chronic kidney disease, or unspecified chronic kidney disease: Secondary | ICD-10-CM | POA: Diagnosis not present

## 2017-02-20 DIAGNOSIS — F17210 Nicotine dependence, cigarettes, uncomplicated: Secondary | ICD-10-CM | POA: Insufficient documentation

## 2017-02-20 DIAGNOSIS — I10 Essential (primary) hypertension: Secondary | ICD-10-CM | POA: Diagnosis not present

## 2017-02-20 DIAGNOSIS — Z720 Tobacco use: Secondary | ICD-10-CM | POA: Diagnosis not present

## 2017-02-20 DIAGNOSIS — N183 Chronic kidney disease, stage 3 (moderate): Secondary | ICD-10-CM | POA: Diagnosis present

## 2017-02-20 DIAGNOSIS — R229 Localized swelling, mass and lump, unspecified: Secondary | ICD-10-CM | POA: Diagnosis not present

## 2017-02-20 DIAGNOSIS — E875 Hyperkalemia: Secondary | ICD-10-CM | POA: Diagnosis not present

## 2017-02-20 MED ORDER — HYDRALAZINE HCL 50 MG PO TABS: 50.0000 mg | ORAL_TABLET | Freq: Three times a day (TID) | ORAL | 3 refills | Status: DC

## 2017-02-20 MED ORDER — LISINOPRIL 20 MG PO TABS: 20.0000 mg | ORAL_TABLET | Freq: Every day | ORAL | 3 refills | Status: DC

## 2017-02-20 MED ORDER — VARENICLINE TARTRATE 0.5 MG X 11 & 1 MG X 42 PO MISC: ORAL | 0 refills | Status: DC

## 2017-02-20 MED ORDER — VARENICLINE TARTRATE 1 MG PO TABS: 1.0000 mg | ORAL_TABLET | Freq: Two times a day (BID) | ORAL | 2 refills | Status: DC

## 2017-02-20 MED ORDER — ATORVASTATIN CALCIUM 40 MG PO TABS: 40.0000 mg | ORAL_TABLET | Freq: Every day | ORAL | 3 refills | Status: DC

## 2017-02-20 MED ORDER — AMLODIPINE BESYLATE 10 MG PO TABS: 10.0000 mg | ORAL_TABLET | Freq: Every morning | ORAL | 2 refills | Status: DC

## 2017-02-20 MED ORDER — ASPIRIN EC 81 MG PO TBEC: 81.0000 mg | DELAYED_RELEASE_TABLET | Freq: Every day | ORAL | 3 refills | Status: DC

## 2017-02-20 MED FILL — AMLODIPINE BESYLATE 10 MG T: 10 | 30 days supply | Qty: 30 | Fill #5

## 2017-02-20 MED FILL — TORSEMIDE 20 MG TABLET: 20 | 30 days supply | Qty: 30 | Fill #1

## 2017-02-20 MED FILL — ATORVASTATIN 40 MG TABLET: 40 | 30 days supply | Qty: 30 | Fill #5

## 2017-02-20 MED FILL — hydrALAZINE HCL 50 MG TABS: 50 | 30 days supply | Qty: 90 | Fill #3

## 2017-02-20 NOTE — Progress Notes (Signed)
Subjective:  Patient ID: Darrell [name], male    DOB: 22-Feb-1951  Age: 66 y.o. MRN: [id number]  CC: No chief complaint on file.   HPI ERUBIEL MANASCO has hx of CVA (09/2012), HTN, CKD stage 3, heart block s/p pacemaker placement he presents with his wife for    1.    HTN: taking hydralazine 50 mg TID. Torsemide 20 mg daily. He restarted lisinopril 20 mg daily on 01/27/2017. Of note he last picked up amlodipine 10 mg daily on 12/29/16, 30 tablets. He continues to smokes about 1 PPD. He partially desire to quit but he also quite enjoys smoking. No ETOH. He wears a 21 mg nicotine patch but removes it to smoke. He did not start chantix due to high cost.    Note his linisopril has been stopped and restarted twice over the past year. It was stopped on 11/25/2015 due to hyperkalemia in setting of acute on chronic kidney disease. He was restarted sometime later. It was stopped again in11/2017 to to AKI not with hyperkalemia caused by doxycyline induced DRESS syndrome.    Past Surgical History:  Procedure Laterality Date  . EYE SURGERY    . INGUINAL HERNIA REPAIR Right 11/23/2014   Procedure: right inguinal hernia repair with mesh;  Surgeon: Armandina Gemma, MD;  Location: [facility];  Service: General;  Laterality: Right;  . INSERTION OF MESH N/A 11/23/2014   Procedure: INSERTION OF MESH;  Surgeon: Armandina Gemma, MD;  Location: [facility];  Service: General;  Laterality: N/A;  . PACEMAKER IMPLANT N/A 01/16/2017   Procedure: Pacemaker Implant;  Surgeon: Will Meredith Leeds, MD;  Location: [place] [facility];  Service: Cardiovascular;  Laterality: N/A;  . SHOULDER SURGERY Bilateral 1988, 1998    Social History  Substance Use Topics  . Smoking status: Current Every Day Smoker    Packs/day: 1.00    Years: 40.00    Types: Cigarettes  . Smokeless tobacco: Never Used  . Alcohol use Yes     Comment: alcohol free for 1 year was drinking 1/2 pint per day    Outpatient Medications Prior to Visit  Medication Sig Dispense  Refill  . amLODipine (NORVASC) 10 MG tablet Take 1 tablet (10 mg total) by mouth every morning. 90 tablet 2  . aspirin EC 81 MG tablet Take 1 tablet (81 mg total) by mouth daily. 90 tablet 3  . atorvastatin (LIPITOR) 40 MG tablet Take 1 tablet (40 mg total) by mouth daily. 90 tablet 3  . brimonidine (ALPHAGAN P) 0.1 % SOLN Place 1 drop into both eyes 2 (two) times daily.    . dorzolamide-timolol (COSOPT) 22.3-6.8 MG/ML ophthalmic solution Place 1 drop into both eyes 2 (two) times daily.    . fluticasone (FLONASE) 50 MCG/ACT nasal spray Place 2 sprays into both nostrils daily. 16 g 6  . hydrALAZINE (APRESOLINE) 50 MG tablet Take 1 tablet (50 mg total) by mouth 3 (three) times daily. 90 tablet 5  . latanoprost (XALATAN) 0.005 % ophthalmic solution Place 1 drop into both eyes at bedtime.    Marland Kitchen lisinopril (PRINIVIL,ZESTRIL) 20 MG tablet Take 1 tablet (20 mg total) by mouth daily. 30 tablet 1  . Multiple Vitamins-Iron (DAILY VITAMIN FORMULA+IRON) TABS Take 1 tablet by mouth daily.    . Psyllium (EQ DAILY FIBER PO) Take 1 tablet by mouth daily.    Marland Kitchen terbinafine (LAMISIL AT) 1 % cream Apply 1 application topically 2 (two) times daily. To R foot 30 g 0  .  torsemide (DEMADEX) 20 MG tablet Take 20 mg by mouth daily.    . varenicline (CHANTIX CONTINUING MONTH PAK) 1 MG tablet Take 1 tablet (1 mg total) by mouth 2 (two) times daily. 60 tablet 2  . varenicline (CHANTIX STARTING MONTH PAK) 0.5 MG X 11 & 1 MG X 42 tablet Take by mouth, Taper per packet insert 53 tablet 0   No facility-administered medications prior to visit.     ROS Review of Systems  Constitutional: Positive for appetite change and unexpected weight change. Negative for chills, fatigue and fever.  HENT: Negative for congestion, rhinorrhea, sore throat, trouble swallowing and voice change.   Eyes: Positive for visual disturbance (blind in R eye ).  Respiratory: Negative for cough and shortness of breath.   Cardiovascular: Negative for  chest pain, palpitations and leg swelling.  Gastrointestinal: Negative for abdominal pain, blood in stool, constipation, diarrhea, nausea and vomiting.  Endocrine: Negative for polydipsia, polyphagia and polyuria.  Musculoskeletal: Positive for arthralgias. Negative for back pain, gait problem, joint swelling, myalgias and neck pain.  Skin: Negative for rash.  Allergic/Immunologic: Negative for immunocompromised state.  Neurological: Positive for speech difficulty and weakness (in lower extermities, L >R ). Negative for light-headedness and headaches.  Hematological: Negative for adenopathy. Does not bruise/bleed easily.  Psychiatric/Behavioral: Negative for dysphoric mood, sleep disturbance and suicidal ideas. The patient is not nervous/anxious.     Objective:  BP 120/67   Pulse 91   Temp 98.1 F (36.7 C) (Oral)   Wt 175 lb 9.6 oz (79.7 kg)   SpO2 100%   BMI 21.37 kg/m   BP/Weight 02/20/2017 01/29/2017 4/53/6468  Systolic BP 032 122 482  Diastolic BP 67 65 92  Wt. (Lbs) 175.6 166.8 169.9  BMI 21.37 20.3 -   Wt Readings from Last 3 Encounters:  02/20/17 175 lb 9.6 oz (79.7 kg)  01/29/17 166 lb 12.8 oz (75.7 kg)  01/17/17 169 lb 14.4 oz (77.1 kg)    Physical Exam  Constitutional: He appears well-developed and well-nourished. No distress.  Thin adult male  Sitting in wheelchair   HENT:  Head: Normocephalic and atraumatic.  Eyes: Conjunctivae and EOM are normal. Right pupil is not reactive. Right pupil is round. Left pupil is round and reactive.  Neck: Normal range of motion. Neck supple.    Cardiovascular: Normal rate, regular rhythm, normal heart sounds and intact distal pulses.   Pulmonary/Chest: Effort normal and breath sounds normal.  Musculoskeletal: He exhibits no edema.  Neurological: He is alert. No cranial nerve deficit. Gait abnormal.  Decreased strength in legs   Skin: Skin is warm and dry. No rash noted. No erythema.     Psychiatric: He has a normal mood and  affect.     Chemistry      Component Value Date/Time   NA 138 02/09/2017 1117   K 4.3 02/09/2017 1117   CL 98 02/09/2017 1117   CO2 24 02/09/2017 1117   BUN 28 (H) 02/09/2017 1117   CREATININE 2.11 (H) 02/09/2017 1117   CREATININE 1.45 (H) 08/10/2016 1647      Component Value Date/Time   CALCIUM 10.5 (H) 02/09/2017 1117   ALKPHOS 80 01/15/2017 2348   AST 12 (L) 01/15/2017 2348   ALT 9 (L) 01/15/2017 2348   BILITOT 0.7 01/15/2017 2348        Assessment & Plan:  Harsha was seen today for hypertension.  Diagnoses and all orders for this visit:  Essential hypertension -  amLODipine (NORVASC) 10 MG tablet; Take 1 tablet (10 mg total) by mouth every morning. -     hydrALAZINE (APRESOLINE) 50 MG tablet; Take 1 tablet (50 mg total) by mouth 3 (three) times daily. -     lisinopril (PRINIVIL,ZESTRIL) 20 MG tablet; Take 1 tablet (20 mg total) by mouth daily.  Tobacco abuse -     Discontinue: varenicline (CHANTIX CONTINUING MONTH PAK) 1 MG tablet; Take 1 tablet (1 mg total) by mouth 2 (two) times daily. -     Discontinue: varenicline (CHANTIX STARTING MONTH PAK) 0.5 MG X 11 & 1 MG X 42 tablet; Take by mouth, Taper per packet insert -     varenicline (CHANTIX STARTING MONTH PAK) 0.5 MG X 11 & 1 MG X 42 tablet; Take by mouth, Taper per packet insert -     varenicline (CHANTIX CONTINUING MONTH PAK) 1 MG tablet; Take 1 tablet (1 mg total) by mouth 2 (two) times daily. For PASS  History of CVA with residual deficit -     atorvastatin (LIPITOR) 40 MG tablet; Take 1 tablet (40 mg total) by mouth daily. -     aspirin EC 81 MG tablet; Take 1 tablet (81 mg total) by mouth daily.  Subcutaneous nodules -     Ambulatory referral to General Surgery   There are no diagnoses linked to this encounter.  No orders of the defined types were placed in this encounter.   Follow-up: Return in about 8 weeks (around 04/17/2017) for smoking cessation.   Boykin Nearing MD

## 2017-02-20 NOTE — Patient Instructions (Addendum)
Kylar was seen today for hypertension.  Diagnoses and all orders for this visit:  Essential hypertension -     amLODipine (NORVASC) 10 MG tablet; Take 1 tablet (10 mg total) by mouth every morning. -     hydrALAZINE (APRESOLINE) 50 MG tablet; Take 1 tablet (50 mg total) by mouth 3 (three) times daily. -     lisinopril (PRINIVIL,ZESTRIL) 20 MG tablet; Take 1 tablet (20 mg total) by mouth daily.  Tobacco abuse -     Discontinue: varenicline (CHANTIX CONTINUING MONTH PAK) 1 MG tablet; Take 1 tablet (1 mg total) by mouth 2 (two) times daily. -     Discontinue: varenicline (CHANTIX STARTING MONTH PAK) 0.5 MG X 11 & 1 MG X 42 tablet; Take by mouth, Taper per packet insert -     varenicline (CHANTIX STARTING MONTH PAK) 0.5 MG X 11 & 1 MG X 42 tablet; Take by mouth, Taper per packet insert -     varenicline (CHANTIX CONTINUING MONTH PAK) 1 MG tablet; Take 1 tablet (1 mg total) by mouth 2 (two) times daily. For PASS  History of CVA with residual deficit -     atorvastatin (LIPITOR) 40 MG tablet; Take 1 tablet (40 mg total) by mouth daily. -     aspirin EC 81 MG tablet; Take 1 tablet (81 mg total) by mouth daily.  Subcutaneous nodules -     Ambulatory referral to General Surgery   Your blood pressure is at goal  Continue current regimen Drop off chantix Rx at the onsite ph  F/u in 8 weeks for smoking cessation  Dr. Adrian Blackwater

## 2017-02-20 NOTE — Assessment & Plan Note (Signed)
chantix via patient assistance program

## 2017-02-20 NOTE — Assessment & Plan Note (Signed)
A: normotensive  Med: compliant P: Continue current regimen

## 2017-02-21 ENCOUNTER — Telehealth: Payer: Self-pay

## 2017-02-21 NOTE — Telephone Encounter (Signed)
Pt was called and a VM was left informing pt of lab results.

## 2017-02-22 MED FILL — POLYETHYLENE GLYCOL 3350: 15 days supply | Qty: 255 | Fill #1

## 2017-02-27 ENCOUNTER — Ambulatory Visit: Payer: Medicare PPO | Admitting: Physical Therapy

## 2017-03-13 ENCOUNTER — Ambulatory Visit: Payer: Medicare PPO | Attending: Family Medicine

## 2017-03-13 ENCOUNTER — Telehealth: Payer: Self-pay

## 2017-03-13 DIAGNOSIS — M6281 Muscle weakness (generalized): Secondary | ICD-10-CM | POA: Insufficient documentation

## 2017-03-13 DIAGNOSIS — R2689 Other abnormalities of gait and mobility: Secondary | ICD-10-CM | POA: Diagnosis present

## 2017-03-13 DIAGNOSIS — I6930 Unspecified sequelae of cerebral infarction: Secondary | ICD-10-CM

## 2017-03-13 DIAGNOSIS — R2681 Unsteadiness on feet: Secondary | ICD-10-CM | POA: Insufficient documentation

## 2017-03-13 NOTE — Telephone Encounter (Signed)
Dr. Adrian Blackwater  I saw Darrell [name] for a PT eval today. He stated that he would like to improve his Speech, he also demonstrated decr. Safety awareness/behavior during the PT session. If you feel he would benefit from a Speech eval, please send [place] a Speech order.  Thank you, Geoffry Paradise, PT,DPT 03/13/17 2:37 PM Phone: [phone] Fax: [phone]

## 2017-03-13 NOTE — Therapy (Signed)
Darrell [name] [address] [place], [name], [address] Phone: [phone]   Fax:  [phone]  Physical Therapy Evaluation  Patient Details  Name: Darrell [name] MRN: [id number] Date of Birth: Jan 21, 1951 Referring Provider: Dr. Adrian Blackwater  Encounter Date: 03/13/2017      PT End of Session - 03/13/17 1309    Visit Number 1   Number of Visits 9   Date for PT Re-Evaluation 04/12/17   Authorization Type [employer] (G-CODE AND PN every 10th visit to be sure).   PT Start Time 1146   PT Stop Time 1233   PT Time Calculation (min) 47 min   Equipment Utilized During Treatment Gait belt   Activity Tolerance Patient tolerated treatment well   Behavior During Therapy WFL for tasks assessed/performed      Past Medical History:  Diagnosis Date  . Chronic kidney disease    stage 3 GFR 30-59 ml/min   . Dysphagia as late effect of cerebrovascular disease    pts wife states pt has to eat soft foods   . GERD (gastroesophageal reflux disease)   . Glaucoma   . High cholesterol   . Hypertension   . Neuromuscular disorder ([place])    chronic inflammatory demyelinating polyneuropathy   . Stroke [facility])    2011 with residual deficit left sided weakness  . Tobacco dependence     Past Surgical History:  Procedure Laterality Date  . EYE SURGERY    . INGUINAL HERNIA REPAIR Right 11/23/2014   Procedure: right inguinal hernia repair with mesh;  Surgeon: Armandina Gemma, MD;  Location: [facility];  Service: General;  Laterality: Right;  . INSERTION OF MESH N/A 11/23/2014   Procedure: INSERTION OF MESH;  Surgeon: Armandina Gemma, MD;  Location: [facility];  Service: General;  Laterality: N/A;  . PACEMAKER IMPLANT N/A 01/16/2017   Procedure: Pacemaker Implant;  Surgeon: Will Meredith Leeds, MD;  Location: [facility];  Service: Cardiovascular;  Laterality: N/A;  . SHOULDER SURGERY Bilateral 1988, 1998    There were no vitals filed for this visit.        Subjective Assessment - 03/13/17 1155    Subjective Pt reports his balance has been getting progressively worse over the last few months, especially since he ceased PT last 07/2016. Pt also having difficulty with speech, pt former pt of [facility].    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    Currently in Pain? No/denies            [facility] PT Assessment - 03/13/17 1159      Assessment   Medical Diagnosis History of CVA with residual deficits, pt at high risk for falls   Referring Provider Dr. Adrian Blackwater   Onset Date/Surgical Date 08/25/16   Hand Dominance Right   Prior Therapy OPPT neuro     Precautions   Precautions Fall;ICD/Pacemaker     Restrictions   Weight Bearing Restrictions No     Balance Screen   Has the patient fallen in the past 6 months Yes   How many times? 1-2   Has the patient had a decrease in activity level because of a fear of falling?  Yes   Is the patient reluctant to leave their home because of  a fear of falling?  Yes  pt also feels unsafe going up stairs     [place] residence   Living Arrangements Spouse/significant other   Available Help at Discharge Family   Type of Pace One level   Jordan - 2 wheels;Wheelchair - manual;Cane - single point;Shower seat;Bedside commode     Prior Function   Level of Independence Independent with household mobility with device   Vocation Retired   Engineer, drilling, play golf     Cognition   Overall Cognitive Status --   Behaviors Impulsive  pt with some impulsive behaviors, memory intact     Sensation   Light Touch Impaired by gross assessment   Additional Comments Pt reported N/T in B hands prior to pacemaker implant  placement. Pt reported decr. light touch in L hand     Coordination   Gross Motor Movements are Fluid and Coordinated No   Fine Motor Movements are Fluid and Coordinated No   Coordination and Movement Description Increased excursion of movement and decr. smoothness of movement.     Posture/Postural Control   Posture/Postural Control Postural limitations   Postural Limitations Forward head;Posterior pelvic tilt;Increased thoracic kyphosis;Weight shift right     Tone   Assessment Location Other (comment);Left Lower Extremity     Tone Assessment - Other   Other Tone Location BLEs, increased tremors noted during session with fatigue.      ROM / Strength   AROM / PROM / Strength AROM;Strength     AROM   Overall AROM  Deficits   Overall AROM Comments Pt lacking approx. 5 degrees of L knee ext (AROM), PT able to obtain 180 degrees of L knee ext during PROM. Decr. L ankle DF and L UE ROM limited.      Strength   Overall Strength Deficits   Overall Strength Comments L UE/LE weakness. LLE: hip flex: 3+/5, knee ext: 4/5, knee flex: 4/5, ankle DF: 2/5 (limited range), seated hip abd/add: 4/5.     Transfers   Transfers Sit to Stand;Stand to Sit   Sit to Stand 4: Min guard;4: Min assist;With upper extremity assist;From chair/3-in-1;Multiple attempts;Uncontrolled descent   Sit to Stand Details Tactile cues for initiation;Tactile cues for sequencing;Tactile cues for weight shifting;Tactile cues for placement;Verbal cues for sequencing;Verbal cues for technique;Verbal cues for precautions/safety;Verbal cues for safe use of DME/AE   Stand to Sit 4: Min guard;4: Min assist;With upper extremity assist;To chair/3-in-1;Uncontrolled descent   Stand to Sit Details (indicate cue type and reason) Tactile cues for sequencing;Tactile cues for initiation;Tactile cues for weight shifting;Tactile cues for posture;Verbal cues for sequencing;Verbal cues for technique;Verbal cues for precautions/safety;Verbal cues  for safe use of DME/AE     Ambulation/Gait   Ambulation/Gait Yes   Ambulation/Gait Assistance 4: Min guard;4: Min assist   Ambulation/Gait Assistance Details Cues for sequencing with RW, upright posture, stride length, and heel strike. Intermittent tremors noted during amb.    Ambulation Distance (Feet) 50 Feet   Assistive device Rolling walker   Gait Pattern Step-through pattern;Step-to pattern;Decreased stance time - left;Decreased step length - right;Decreased stride length;Decreased hip/knee flexion - left;Decreased dorsiflexion - left;Decreased weight shift to left;Left flexed knee in stance;Trunk flexed  intermittent step through pattern   Gait velocity 0.54ft/sec with RW     Standardized Balance Assessment   Standardized Balance Assessment Timed Up and Go Test     Timed Up  and Go Test   TUG Normal TUG   Normal TUG (seconds) 140.2  with RW and min guard to min A     LLE Tone   LLE Tone Hypertonic     LLE Tone   Hypertonic Details Quads: passive movement difficulty.            Objective measurements completed on examination: See above findings.                  PT Education - 03/13/17 1309    Education provided Yes   Education Details PT discussed outcome measure results, PT POC, frequency and duration.    Person(s) Educated Patient;Spouse   Methods Explanation   Comprehension Verbalized understanding          PT Short Term Goals - 03/13/17 1417      PT SHORT TERM GOAL #1   Title STGs same ass LTGs           PT Long Term Goals - 03/13/17 1417      PT LONG TERM GOAL #1   Title Pt will effectively perform HEP with assist of wife to maximize functional gains made in PT.  TARGET DATE FOR ALL LTGS: 04/10/17   Status New     PT LONG TERM GOAL #2   Title Pt will perform STS txf to/from w/c with RW and S to improve safety during functional mobility.    Status New     PT LONG TERM GOAL #3   Title Pt will perform TUG time in </=100 seconds  with RW in order to improve safety during functional mobility and decr. falls risk.    Status New     PT LONG TERM GOAL #4   Title Perform BERG and write goal as appropriate.   Status New     PT LONG TERM GOAL #5   Title Pt will improve gait speed to >/=1.54ft/sec. with RW to improve functional mobility during househould mobility.    Status New     Additional Long Term Goals   Additional Long Term Goals Yes     PT LONG TERM GOAL #6   Title Pt will amb. 150' with RW, with close S of wife, in order to safely amb. at home.   Status New                Plan - 03/13/17 1310    Clinical Impression Statement Pt is a pleasant 66y/o male presenting to OPPT neuro with hx of CVA (2011) with residual L-sided weakness. Pt is familiar to this clinic, as he received therapy here in 2017. Pt's PMH significant for the following: CIDP, Hx of CVA in 2011 with L residual weakness, HTN, R eye blind and limited vision in L eye. Pt's gait speed and TUG time indicate pt is at risk for falls. The following deficits were noted during exam: gait deviations, impaired balance, decr. strength, postural dysfunction, impaired sensation, incr. tone, and decr. safety awareness. Pt would benefit from skilled PT to improve safety during functional mobility.    History and Personal Factors relevant to plan of care: pt's wife assists pt with ADLs s/p CVA   Clinical Presentation Stable   Clinical Presentation due to: see PMH   Clinical Decision Making Moderate   Rehab Potential Fair   Clinical Impairments Affecting Rehab Potential see above   PT Frequency 2x / week   PT Duration 4 weeks   PT Treatment/Interventions ADLs/Self Care Home Management;Biofeedback;Electrical Stimulation;Cognitive remediation;Neuromuscular  re-education;Balance training;Therapeutic exercise;Manual techniques;Therapeutic activities;Functional mobility training;Stair training;Gait training;Orthotic Fit/Training;DME Instruction;Patient/family  education;Vestibular   PT Next Visit Plan Perform BERG and write goal if appropriate, initiate balance/strength/flexibility HEP.   Recommended Other Services Speech eval   Consulted and Agree with Plan of Care Family member/caregiver;Patient   Family Member Consulted wife: Darrell [name]      Patient will benefit from skilled therapeutic intervention in order to improve the following deficits and impairments:  Abnormal gait, Decreased endurance, Impaired sensation, Impaired tone, Decreased knowledge of use of DME, Decreased strength, Impaired UE functional use, Decreased balance, Decreased mobility, Decreased range of motion, Decreased coordination, Impaired flexibility, Postural dysfunction  Visit Diagnosis: Other abnormalities of gait and mobility - Plan: PT plan of care cert/re-cert  Unsteadiness on feet - Plan: PT plan of care cert/re-cert  Muscle weakness (generalized) - Plan: PT plan of care cert/re-cert      G-Codes - 18/59/09 1432    Functional Assessment Tool Used (Outpatient Only) TUG with RW: 140.2; gait speed with RW: 0.30ft/sec.   Functional Limitation Mobility: Walking and moving around   Mobility: Walking and Moving Around Current Status [phone]) At least 60 percent but less than 80 percent impaired, limited or restricted   Mobility: Walking and Moving Around Goal Status [phone]) At least 20 percent but less than 40 percent impaired, limited or restricted       Problem List Patient Active Problem List   Diagnosis Date Noted  . Status cardiac pacemaker 01/29/2017  . Unintended weight loss 01/29/2017  . Tinea pedis of right foot 01/29/2017  . Hypercalcemia 01/15/2017  . Heart block 01/15/2017  . Constipation 09/12/2016  . Glaucoma 09/12/2016  . Liver hemangioma 08/14/2016  . Renal cyst 08/14/2016  . Renal mass, right 08/14/2016  . Elevated liver enzymes 08/10/2016  . CKD (chronic kidney disease) stage 3, GFR 30-59 ml/min 08/16/2015  . At high risk for falls 08/16/2015   . Subcutaneous nodules 08/16/2015  . Prediabetes 10/07/2013  . Inguinal hernia 03/25/2013  . History of CVA with residual deficit 03/25/2013  . HTN (hypertension) 10/09/2012  . Tobacco abuse 10/09/2012    Michah Minton L 03/13/2017, 2:34 PM  [facility] [address] [place], [name], [address] Phone: [phone]   Fax:  [phone]  Name: Darrell [name] MRN: [id number] Date of Birth: 1951/05/05  Geoffry Paradise, PT,DPT 03/13/17 2:35 PM Phone: [phone] Fax: [phone]

## 2017-03-15 NOTE — Telephone Encounter (Signed)
Speech therapy referral placed.

## 2017-03-16 ENCOUNTER — Ambulatory Visit: Payer: Medicare PPO | Attending: Family Medicine

## 2017-03-19 ENCOUNTER — Ambulatory Visit: Payer: Medicare PPO | Admitting: Physical Therapy

## 2017-03-19 ENCOUNTER — Encounter: Payer: Self-pay | Admitting: Physical Therapy

## 2017-03-19 DIAGNOSIS — R2681 Unsteadiness on feet: Secondary | ICD-10-CM

## 2017-03-19 DIAGNOSIS — M6281 Muscle weakness (generalized): Secondary | ICD-10-CM

## 2017-03-19 DIAGNOSIS — R2689 Other abnormalities of gait and mobility: Secondary | ICD-10-CM | POA: Diagnosis not present

## 2017-03-19 NOTE — Patient Instructions (Signed)
Hamstring Strength: Forward Stool Walk    Using both legs, "walk" forward down a long hall. Be sure to reach forward with your heel, pull forward with that leg until your foot is flat, and THEN reach the other heel forward and pull until your foot is flat. "heel-toe walking" Repeat __2-3__ times down hallway.  Do __2__ sessions per day.  http://cc.exer.us/48   Copyright  VHI. All rights reserved.

## 2017-03-19 NOTE — Therapy (Signed)
[facility] [address] [place], [name], [address] Phone: [phone]   Fax:  [phone]  Physical Therapy Treatment  Patient Details  Name: Darrell [name] MRN: [id number] Date of Birth: 02-07-1951 Referring Provider: Dr. Adrian Blackwater  Encounter Date: 03/19/2017      PT End of Session - 03/19/17 2009    Visit Number 2   Number of Visits 9   Date for PT Re-Evaluation 04/12/17   Authorization Type [employer] (G-CODE AND PN every 10th visit to be sure).   PT Start Time 0935   PT Stop Time 1015   PT Time Calculation (min) 40 min   Equipment Utilized During Treatment Gait belt   Activity Tolerance Patient tolerated treatment well   Behavior During Therapy WFL for tasks assessed/performed      Past Medical History:  Diagnosis Date  . Chronic kidney disease    stage 3 GFR 30-59 ml/min   . Dysphagia as late effect of cerebrovascular disease    pts wife states pt has to eat soft foods   . GERD (gastroesophageal reflux disease)   . Glaucoma   . High cholesterol   . Hypertension   . Neuromuscular disorder ([place])    chronic inflammatory demyelinating polyneuropathy   . Stroke [facility])    2011 with residual deficit left sided weakness  . Tobacco dependence     Past Surgical History:  Procedure Laterality Date  . EYE SURGERY    . INGUINAL HERNIA REPAIR Right 11/23/2014   Procedure: right inguinal hernia repair with mesh;  Surgeon: Armandina Gemma, MD;  Location: [facility];  Service: General;  Laterality: Right;  . INSERTION OF MESH N/A 11/23/2014   Procedure: INSERTION OF MESH;  Surgeon: Armandina Gemma, MD;  Location: [facility];  Service: General;  Laterality: N/A;  . PACEMAKER IMPLANT N/A 01/16/2017   Procedure: Pacemaker Implant;  Surgeon: Will Meredith Leeds, MD;  Location: [facility];  Service: Cardiovascular;  Laterality: N/A;  . SHOULDER SURGERY Bilateral 1988, 1998    There were no vitals filed for this visit.       Subjective Assessment - 03/19/17 2003    Subjective Wife reports no changes since last visit.    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    Currently in Pain? No/denies                         [facility] Adult PT Treatment/Exercise - 03/19/17 0001      Transfers   Transfers Sit to Stand;Stand to Sit   Sit to Stand 4: Min guard;4: Min assist;With upper extremity assist;From chair/3-in-1;Multiple attempts;Uncontrolled descent   Stand to Sit 4: Min guard;4: Min assist;With upper extremity assist;To chair/3-in-1;Uncontrolled descent     Ambulation/Gait   Ambulation/Gait Assistance 4: Min assist   Ambulation/Gait Assistance Details vc for sequencing and assist to facilitate upright posture with hip extension in stance   Ambulation Distance (Feet) 30 Feet  30   Assistive device Rolling walker   Gait Pattern Step-through pattern;Step-to pattern;Decreased stance time - left;Decreased step length - right;Decreased stride length;Decreased hip/knee flexion - left;Decreased dorsiflexion - left;Decreased weight shift to left;Left flexed knee in stance;Trunk flexed   Ambulation Surface Level  Educational psychologist Assistance 4: Mudlogger Both lower extermities   Wheelchair [employer] Needs assistance   Distance 30   Comments for proper technique when using legs only to reinforce "heel-toe" for walking     Standardized Balance Assessment   Standardized Balance Assessment Berg Balance Test     Berg Balance Test   Sit to Stand Needs moderate or maximal assist to stand   Standing Unsupported Unable to stand 30 seconds unassisted   Sitting with Back Unsupported but Feet Supported on Floor or Stool Able to sit safely and  securely 2 minutes   Stand to Sit Needs assistance to sit   Transfers Needs one person to assist   Standing Unsupported with Eyes Closed Needs help to keep from falling   Standing Ubsupported with Feet Together Needs help to attain position and unable to hold for 15 seconds   From Standing, Reach Forward with Outstretched Arm Loses balance while trying/requires external support   From Standing Position, Pick up Object from Floor Unable to try/needs assist to keep balance   From Standing Position, Turn to Look Behind Over each Shoulder Needs assist to keep from losing balance and falling   Turn 360 Degrees Needs assistance while turning   Standing Unsupported, Alternately Place Feet on Step/Stool Needs assistance to keep from falling or unable to try   Standing Unsupported, One Foot in Front Loses balance while stepping or standing   Standing on One Leg Unable to try or needs assist to prevent fall   Total Score 5                PT Education - 03/19/17 2009    Education provided Yes   Education Details see addition to HEP   Person(s) Educated Patient;Spouse   Methods Explanation;Demonstration;Tactile cues;Verbal cues;Handout   Comprehension Verbalized understanding;Returned demonstration;Verbal cues required;Tactile cues required;Need further instruction          PT Short Term Goals - 03/13/17 1417      PT SHORT TERM GOAL #1   Title STGs same ass LTGs           PT Long Term Goals - 03/19/17 2016      PT LONG TERM GOAL #1   Title Pt will effectively perform HEP with assist of wife to maximize functional gains made in PT.  TARGET DATE FOR ALL LTGS: 04/10/17   Time 4   Period Weeks   Status On-going     PT LONG TERM GOAL #2   Title Pt will perform STS txf to/from w/c with RW and S to improve safety during functional mobility.    Time 4   Period Weeks   Status On-going     PT LONG TERM GOAL #3   Title Pt will perform TUG time in </=100 seconds with RW in order to  improve safety during functional mobility and decr. falls risk.    Time 4   Period Weeks   Status On-going     PT LONG TERM GOAL #4   Title Perform BERG and write goal as appropriate.   Baseline 6/25 5/56; no goal set as pt reliant on RW for UE support and do not anticipate a minimum clinically important difference within 4 weeks.   Time 4   Period Weeks   Status Achieved     PT LONG TERM GOAL #5   Title Pt will improve gait speed to >/=1.28ft/sec.  with RW to improve functional mobility during househould mobility.    Time 4   Period Weeks   Status On-going     PT LONG TERM GOAL #6   Title Pt will amb. 150' with RW, with close S of wife, in order to safely amb. at home.   Time 4   Period Weeks   Status On-going               Plan - 03/19/17 2011    Clinical Impression Statement Session focused on completing Berg Balance test (5/56), pre-gait and gait training, and strengthening. Wife very attentive and supportive during session. Will continue to work towards  [place] Presentation due to: CVA Lt weakness, Rt eye blind, limited vision Lt eye, CKD, dysphagia, HTN, CIDP   Rehab Potential Fair   Clinical Impairments Affecting Rehab Potential see above   PT Frequency 2x / week   PT Duration 4 weeks   PT Treatment/Interventions ADLs/Self Care Home Management;Biofeedback;Electrical Stimulation;Cognitive remediation;Neuromuscular re-education;Balance training;Therapeutic exercise;Manual techniques;Therapeutic activities;Functional mobility training;Stair training;Gait training;Orthotic Fit/Training;DME Instruction;Patient/family education;Vestibular   PT Next Visit Plan initiate balance/strength/flexibility HEP; gait training   Consulted and Agree with Plan of Care Family member/caregiver;Patient   Family Member Consulted wife: [name]      Patient will benefit from skilled therapeutic intervention in order to improve the following deficits and impairments:  Abnormal  gait, Decreased endurance, Impaired sensation, Impaired tone, Decreased knowledge of use of DME, Decreased strength, Impaired UE functional use, Decreased balance, Decreased mobility, Decreased range of motion, Decreased coordination, Impaired flexibility, Postural dysfunction  Visit Diagnosis: Other abnormalities of gait and mobility  Unsteadiness on feet  Muscle weakness (generalized)     Problem List Patient Active Problem List   Diagnosis Date Noted  . Status cardiac pacemaker 01/29/2017  . Unintended weight loss 01/29/2017  . Tinea pedis of right foot 01/29/2017  . Hypercalcemia 01/15/2017  . Heart block 01/15/2017  . Constipation 09/12/2016  . Glaucoma 09/12/2016  . Liver hemangioma 08/14/2016  . Renal cyst 08/14/2016  . Renal mass, right 08/14/2016  . Elevated liver enzymes 08/10/2016  . CKD (chronic kidney disease) stage 3, GFR 30-59 ml/min 08/16/2015  . At high risk for falls 08/16/2015  . Subcutaneous nodules 08/16/2015  . Prediabetes 10/07/2013  . Inguinal hernia 03/25/2013  . History of CVA with residual deficit 03/25/2013  . HTN (hypertension) 10/09/2012  . Tobacco abuse 10/09/2012    Rexanne Mano, PT 03/19/2017, 8:24 PM  [facility] [address] [place], [name], [address] Phone: [phone]   Fax:  [phone]  Name: Darrell [name] MRN: [id number] Date of Birth: 1951-08-28

## 2017-03-23 ENCOUNTER — Ambulatory Visit: Payer: Medicare PPO | Admitting: Physical Therapy

## 2017-03-26 ENCOUNTER — Ambulatory Visit: Payer: Medicare PPO | Admitting: Physical Therapy

## 2017-03-29 IMAGING — MR MR ABDOMEN W/O CM
4 of 8 series · 23 of 48 positions shown · non-contrast
Comparison: CT on 08/11/2016 and 01/22/2010

CLINICAL DATA: Indeterminate bilateral renal lesions seen on recent
noncontrast CT.

EXAM:
MRI ABDOMEN WITHOUT CONTRAST
TECHNIQUE: Multiplanar multisequence MR imaging was performed without the
administration of intravenous contrast. Exam was specifically
ordered without IV contrast by ordering clinician.

[Series 4: DWI b500 · axial · 6.0mm · 1.48mm/px · z∈[+44,+286]mm · 8 of 64 slices shown]
[im 1/64]
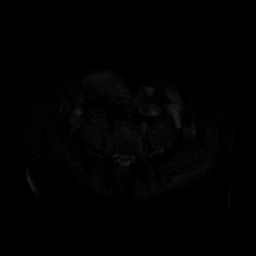
[im 10/64]
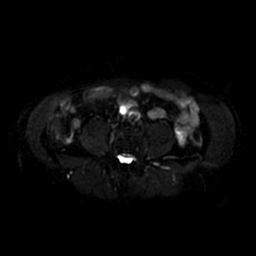
[im 19/64]
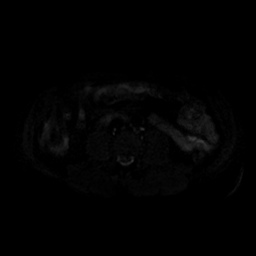
[im 28/64]
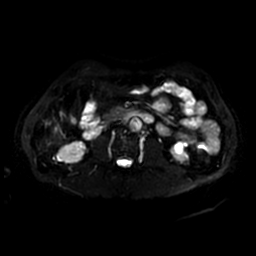
[im 37/64]
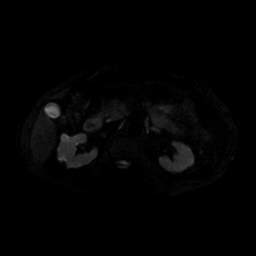
[im 46/64]
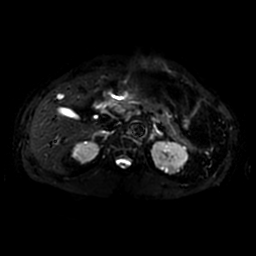
[im 55/64]
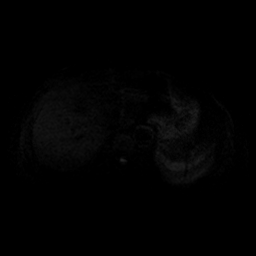
[im 64/64]
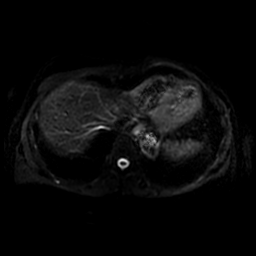

[Series 5: T2 fat-sat · axial · 5.0mm · 0.78mm/px · z∈[+97,+272]mm · 4 of 36 slices shown]
[im 1/36]
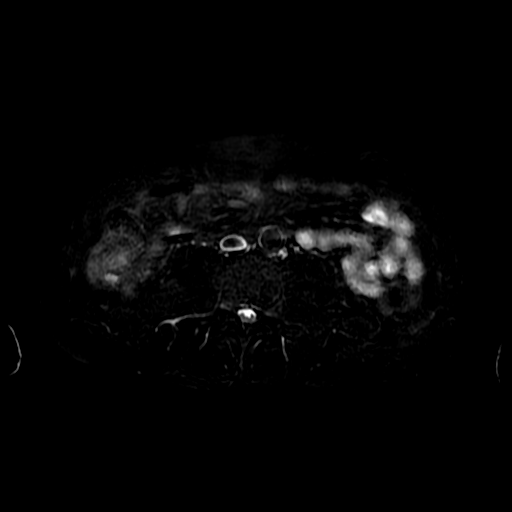
[im 12/36]
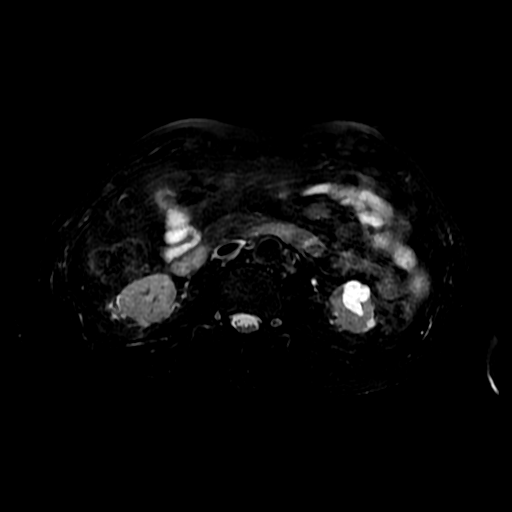
[im 24/36]
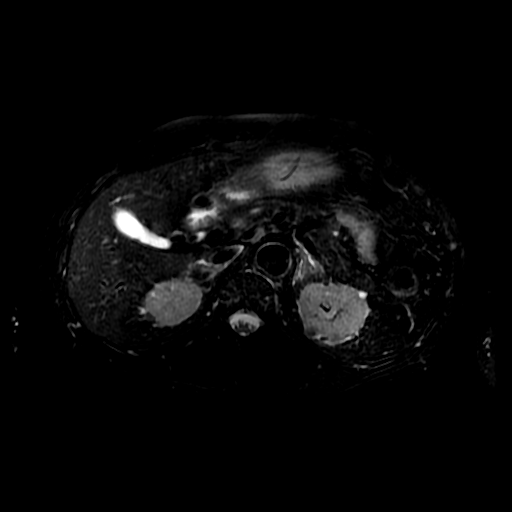
[im 36/36]
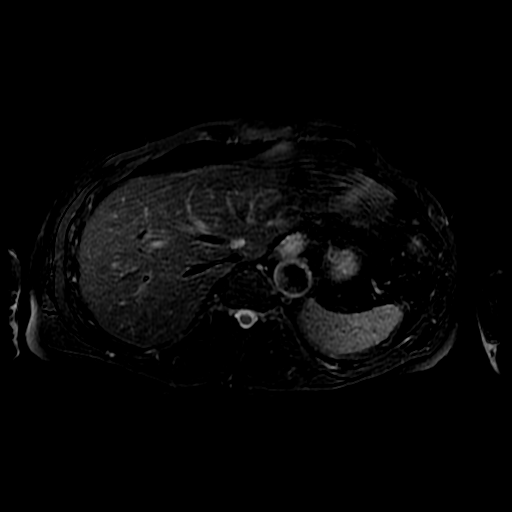

[Series 8: ax dualecho · axial · 5.0mm · 0.78mm/px · z∈[+73,+238]mm · 8 of 78 slices shown]
[im 1/78]
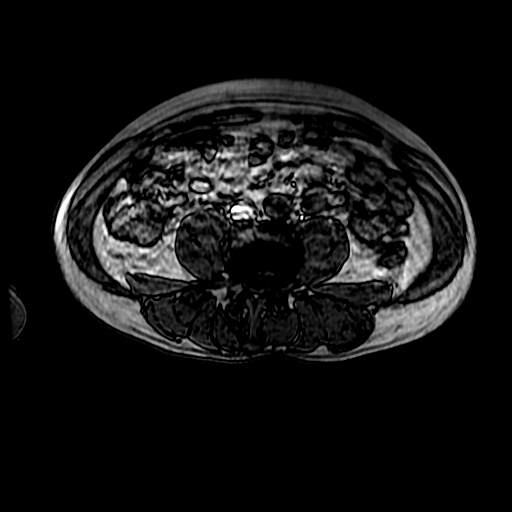
[im 10/78]
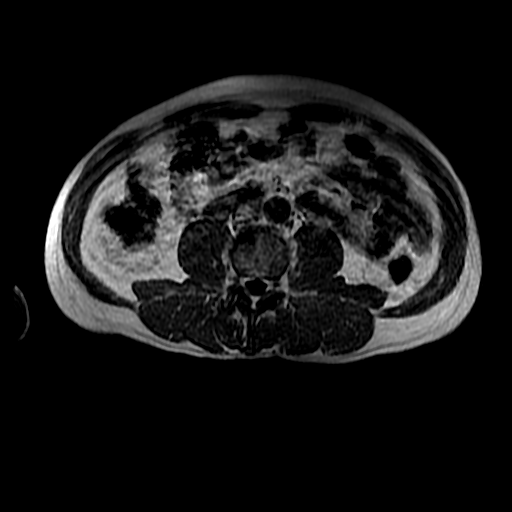
[im 20/78]
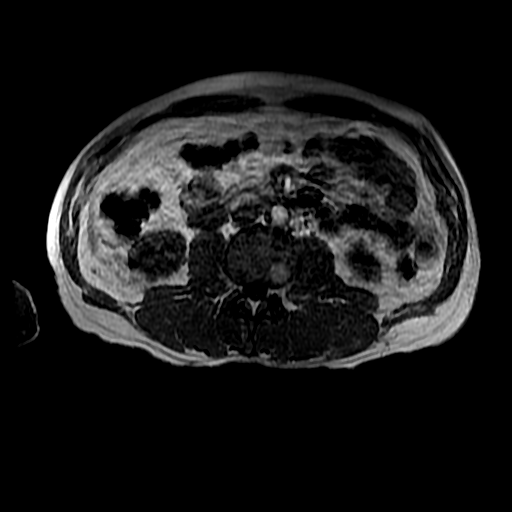
[im 29/78]
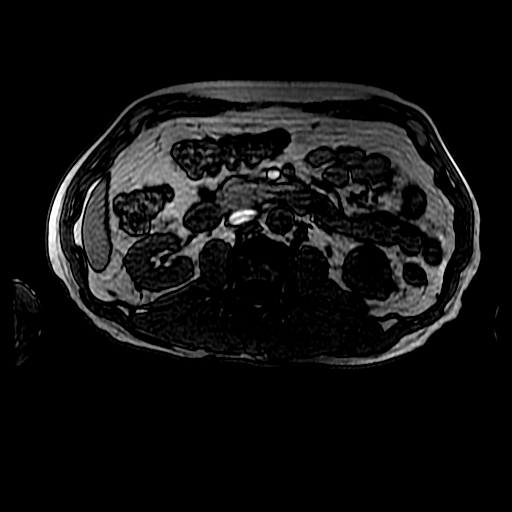
[im 39/78]
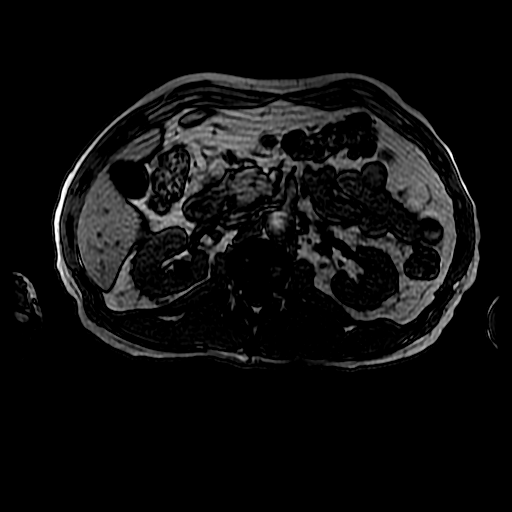
[im 49/78]
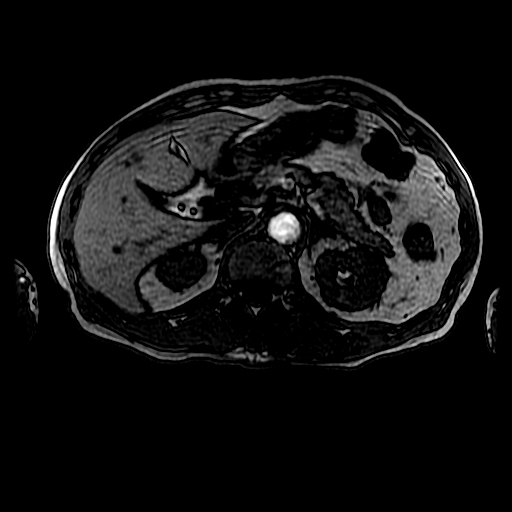
[im 58/78]
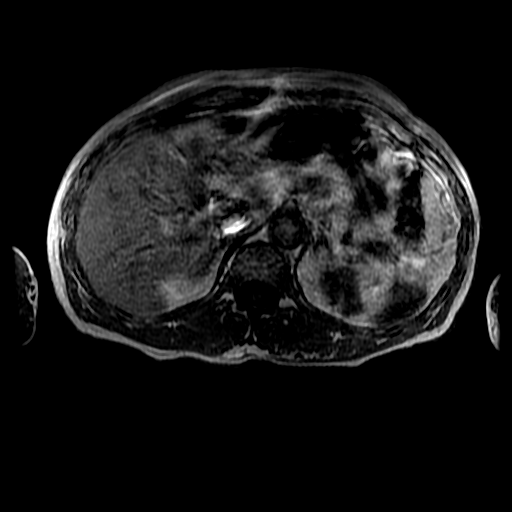
[im 68/78]
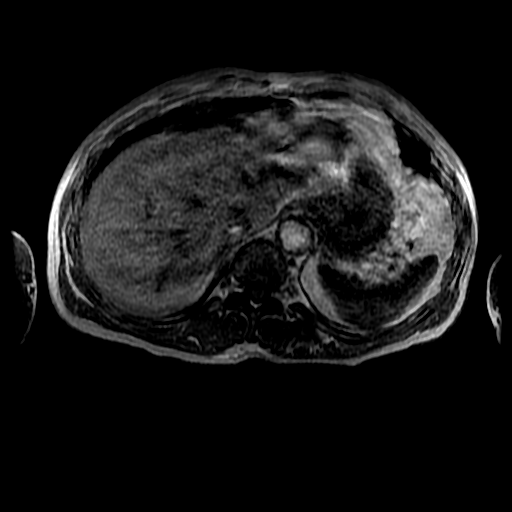

[Series 9: T2 · axial · 5.0mm · 0.78mm/px · z∈[+73,+263]mm · 3 of 39 slices shown]
[im 1/39]
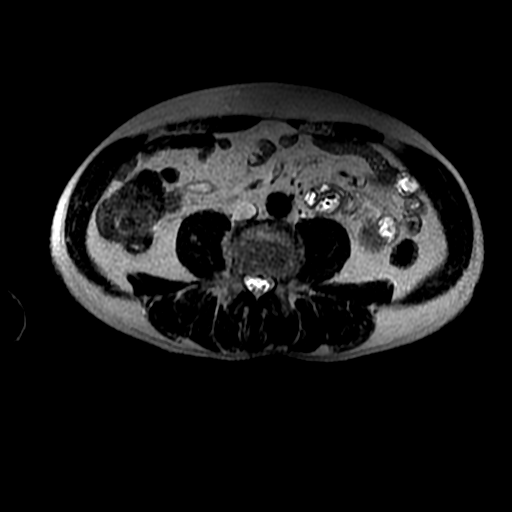
[im 26/39]
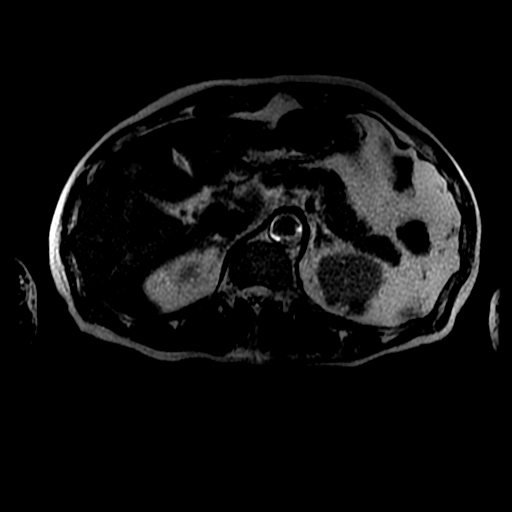
[im 39/39]
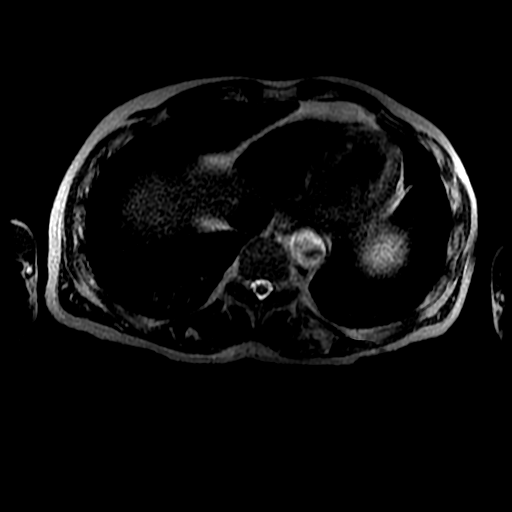

[23 of 48 positions shown; findings below may reference images not displayed]

FINDINGS: Lower chest: No acute findings.

Hepatobiliary: 2 cm left hepatic lobe lesion with marked T2
hyperintensity has not significantly changed since 2711 contrast
enhanced CT,, consistent with a benign hemangioma. Other similar
markedly T2 hyperintense sub-cm lesions in the right and left
hepatic lobes, which also consistent with tiny benign hemangiomas or
cysts. No suspicious liver masses are identified.

Gallbladder is unremarkable. No evidence of biliary ductal
dilatation.

Pancreas: No mass or inflammatory process visualized on this
unenhanced exam.

Spleen:  Within normal limits in size.

Adrenals/Urinary tract: Normal adrenal glands. Several small simple
appearing cysts are noted in both kidneys.

A complex cyst showing several thin septations is seen in the lower
pole the left kidney measuring 2.3 cm on image [DATE], which is
increased in size since previous study. Although characterization is
limited without IV contrast, this is most consistent with an
indeterminate Bosniak category 2F cystic lesion.

1.5 cm subcapsular lesion in anterior upper pole of right kidney on
image [DATE] shows T1 and T2 isointensity with normal renal
parenchyma. Characterization is limited without IV contrast, and
solid renal mass cannot be excluded. A 2.1 cm subcapsular lesion is
seen in the posterior upper pole of the left kidney which shows T1
isointensity and T2 hypointensity. This also cannot be characterized
without IV contrast, and solid renal mass cannot be excluded.
Several other similar less than 1 cm lesions are seen in both
kidneys showing T1 isointensity and T2 hypo intensity. These lesions
also cannot be characterized without IV contrast, and are
indeterminate.

No evidence of hydronephrosis.

Stomach/Bowel: No evidence of obstruction, inflammatory process, or
abnormal fluid collections.

Vascular/Lymphatic: No pathologically enlarged lymph nodes
identified. No evidence of abdominal aortic aneurysm.

Other:  None.

Musculoskeletal:  No suspicious bone lesions identified.
IMPRESSION: Several indeterminate lesions in both kidneys, largest measuring
cm in anterior upper pole of right kidney and 2.1 cm in posterior
upper pole of left kidney. These cannot be characterized without IV
contrast, and solid renal neoplasms cannot be excluded. Additional
indeterminate Bosniak category 2F cystic lesion in lower pole of
left kidney. Recommend followup with abdomen MRI without and with
contrast in 6 months (patient may receive gadolinium IV contrast
with eGFR of 30 or higher).

Small benign hepatic hemangiomas and/or cysts.

## 2017-04-03 ENCOUNTER — Ambulatory Visit: Payer: Medicare PPO | Attending: Family Medicine

## 2017-04-03 DIAGNOSIS — I6930 Unspecified sequelae of cerebral infarction: Secondary | ICD-10-CM | POA: Insufficient documentation

## 2017-04-03 DIAGNOSIS — R2689 Other abnormalities of gait and mobility: Secondary | ICD-10-CM | POA: Diagnosis not present

## 2017-04-03 DIAGNOSIS — R2681 Unsteadiness on feet: Secondary | ICD-10-CM | POA: Insufficient documentation

## 2017-04-03 DIAGNOSIS — M6281 Muscle weakness (generalized): Secondary | ICD-10-CM | POA: Diagnosis present

## 2017-04-03 NOTE — Patient Instructions (Signed)
Hamstring Strength: Forward Stool Walk    Using both legs, "walk" forward down a long hall. Be sure to reach forward with your heel, pull forward with that leg until your foot is flat, and THEN reach the other heel forward and pull until your foot is flat. "heel-toe walking" Repeat __2-3__ times down hallway.  Do __2__ sessions per day.  http://cc.exer.us/48   Copyright  VHI. All rights reserved.   Functional Quadriceps: Sit to Stand    Sit on edge of chair, feet flat on floor. Stand upright, extending knees fully. Repeat __10__ times per set. Do __1__ sets per session. Do __2__ sessions per day.  http://orth.exer.us/734   Copyright  VHI. All rights reserved.    HIP: Hamstrings - Short Sitting    Rest leg on raised surface. Keep knee straight. Lift chest and lean forward while still maintaining upright posture and keeping knee straight. Hold __30_ seconds. Switch and perform with other leg. Perform _3__ reps per set, __2-3_ sets per day, _7__ days per week  Copyright  VHI. All rights reserved.   Piriformis Stretch, Sitting    Sit, one ankle on opposite knee, same-side hand on crossed knee. Push down on knee, keeping spine straight. Lean torso forward, with flat back, until tension is felt in hamstrings and gluteals of crossed-leg side. Hold __30_ seconds.  Repeat with other leg. Repeat _3__ times per session. Do _2-3__ sessions per day. 7 days a week.  Copyright  VHI. All rights reserved.

## 2017-04-03 NOTE — Therapy (Signed)
[facility] [address] [place], [name], [address] Phone: [phone]   Fax:  [phone]  Physical Therapy Treatment  Patient Details  Name: Darrell [name] MRN: [id number] Date of Birth: [birth year] Referring Provider: Dr. Adrian Blackwater  Encounter Date: 04/03/2017      PT End of Session - 04/03/17 1517    Visit Number 3   Number of Visits 9   Date for PT Re-Evaluation 04/12/17   Authorization Type [employer] (G-CODE AND PN every 10th visit to be sure).   PT Start Time [phone]   PT Stop Time 1013   PT Time Calculation (min) 40 min   Activity Tolerance Patient tolerated treatment well   Behavior During Therapy WFL for tasks assessed/performed      Past Medical History:  Diagnosis Date  . Chronic kidney disease    stage 3 GFR 30-59 ml/min   . Dysphagia as late effect of cerebrovascular disease    pts wife states pt has to eat soft foods   . GERD (gastroesophageal reflux disease)   . Glaucoma   . High cholesterol   . Hypertension   . Neuromuscular disorder ([place])    chronic inflammatory demyelinating polyneuropathy   . Stroke [facility])    2011 with residual deficit left sided weakness  . Tobacco dependence     Past Surgical History:  Procedure Laterality Date  . EYE SURGERY    . INGUINAL HERNIA REPAIR Right 11/23/2014   Procedure: right inguinal hernia repair with mesh;  Surgeon: Armandina Gemma, MD;  Location: [facility];  Service: General;  Laterality: Right;  . INSERTION OF MESH N/A 11/23/2014   Procedure: INSERTION OF MESH;  Surgeon: Armandina Gemma, MD;  Location: [facility];  Service: General;  Laterality: N/A;  . PACEMAKER IMPLANT N/A 01/16/2017   Procedure: Pacemaker Implant;  Surgeon: Will Meredith Leeds, MD;  Location: [facility];  Service: Cardiovascular;  Laterality: N/A;  . SHOULDER SURGERY Bilateral 1988, 1998    There were no vitals filed for this visit.      Subjective Assessment - 04/03/17 0935     Subjective Pt denied falls or changes since last visit.    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    Currently in Pain? No/denies         Therex: Pt performed strengthening and flexibility HEP in w/c and on mat with cues and S-min A for safety. Pt's wife assisted to ensure pt can perform at home. Please see pt instructions for details.                 [facility] Adult PT Treatment/Exercise - 04/03/17 1517      Transfers   Transfers Sit to Stand;Stand to Sit;Stand Pivot Transfers   Sit to Stand 4: Min assist;From elevated surface;With upper extremity assist;From chair/3-in-1;4: Min guard   Sit to Stand Details Tactile cues for sequencing;Tactile cues for weight shifting;Tactile cues for posture;Tactile cues for placement;Verbal cues for sequencing;Verbal cues for technique;Verbal cues for precautions/safety   Stand to Sit 4: Min guard;4: Min assist;With upper extremity assist;To chair/3-in-1;Uncontrolled descent   Stand to Sit Details (indicate cue type and reason) Tactile cues for sequencing;Tactile cues for initiation;Tactile cues for weight shifting;Tactile cues for posture;Verbal cues for sequencing;Verbal cues  for technique;Verbal cues for precautions/safety;Verbal cues for safe use of DME/AE   Stand to Sit Details PT provided pt with HEP to improve STS txfs   Stand Pivot Transfers 4: Min assist;From elevated surface;With armrests   Stand Pivot Transfer Details (indicate cue type and reason) Cues for ant. weight shifting and technique. to/from w/c<>mat.                PT Education - 04/03/17 1517    Education provided Yes   Education Details PT reviewed and added to HEP.    Person(s) Educated Patient;Spouse   Methods Explanation;Demonstration;Tactile cues;Verbal cues;Handout    Comprehension Returned demonstration;Verbalized understanding;Need further instruction          PT Short Term Goals - 03/13/17 1417      PT SHORT TERM GOAL #1   Title STGs same ass LTGs           PT Long Term Goals - 03/19/17 2016      PT LONG TERM GOAL #1   Title Pt will effectively perform HEP with assist of wife to maximize functional gains made in PT.  TARGET DATE FOR ALL LTGS: 04/10/17   Time 4   Period Weeks   Status On-going     PT LONG TERM GOAL #2   Title Pt will perform STS txf to/from w/c with RW and S to improve safety during functional mobility.    Time 4   Period Weeks   Status On-going     PT LONG TERM GOAL #3   Title Pt will perform TUG time in </=100 seconds with RW in order to improve safety during functional mobility and decr. falls risk.    Time 4   Period Weeks   Status On-going     PT LONG TERM GOAL #4   Title Perform BERG and write goal as appropriate.   Baseline 6/25 5/56; no goal set as pt reliant on RW for UE support and do not anticipate a minimum clinically important difference within 4 weeks.   Time 4   Period Weeks   Status Achieved     PT LONG TERM GOAL #5   Title Pt will improve gait speed to >/=1.65ft/sec. with RW to improve functional mobility during househould mobility.    Time 4   Period Weeks   Status On-going     PT LONG TERM GOAL #6   Title Pt will amb. 150' with RW, with close S of wife, in order to safely amb. at home.   Time 4   Period Weeks   Status On-going               Plan - 04/03/17 1519    Clinical Impression Statement Today's skilled session focused on reviewing and adding to HEP to improve flexibility and strengthening. Pt demonstrated improved technique with cues and repetition. Pt and wife not interested in speech therapy at this time. Pt would continue to benefit from skilled PT to improve safety during functional mobilty.    Rehab Potential Fair   Clinical Impairments Affecting Rehab Potential see  above   PT Frequency 2x / week   PT Duration 4 weeks   PT Treatment/Interventions ADLs/Self Care Home Management;Biofeedback;Electrical Stimulation;Cognitive remediation;Neuromuscular re-education;Balance training;Therapeutic exercise;Manual techniques;Therapeutic activities;Functional mobility training;Stair training;Gait training;Orthotic Fit/Training;DME Instruction;Patient/family education;Vestibular   PT Next Visit Plan Add to balance/strength/flexibility HEP; gait training   Consulted and Agree with Plan of Care Family member/caregiver;Patient   Family Member Consulted wife: [name]  Patient will benefit from skilled therapeutic intervention in order to improve the following deficits and impairments:  Abnormal gait, Decreased endurance, Impaired sensation, Impaired tone, Decreased knowledge of use of DME, Decreased strength, Impaired UE functional use, Decreased balance, Decreased mobility, Decreased range of motion, Decreased coordination, Impaired flexibility, Postural dysfunction  Visit Diagnosis: Other abnormalities of gait and mobility  Muscle weakness (generalized)  Unsteadiness on feet     Problem List Patient Active Problem List   Diagnosis Date Noted  . Status cardiac pacemaker 01/29/2017  . Unintended weight loss 01/29/2017  . Tinea pedis of right foot 01/29/2017  . Hypercalcemia 01/15/2017  . Heart block 01/15/2017  . Constipation 09/12/2016  . Glaucoma 09/12/2016  . Liver hemangioma 08/14/2016  . Renal cyst 08/14/2016  . Renal mass, right 08/14/2016  . Elevated liver enzymes 08/10/2016  . CKD (chronic kidney disease) stage 3, GFR 30-59 ml/min 08/16/2015  . At high risk for falls 08/16/2015  . Subcutaneous nodules 08/16/2015  . Prediabetes 10/07/2013  . Inguinal hernia 03/25/2013  . History of CVA with residual deficit 03/25/2013  . HTN (hypertension) 10/09/2012  . Tobacco abuse 10/09/2012    Ronny Ruddell L 04/03/2017, 3:21 PM  [facility] [address] [place], [name], [address] Phone: [phone]   Fax:  [phone]  Name: KAMDEN STANISLAW MRN: [id number] Date of Birth: 1951-05-14  Geoffry Paradise, PT,DPT 04/03/17 3:22 PM Phone: [phone] Fax: [phone]

## 2017-04-06 ENCOUNTER — Encounter: Payer: Self-pay | Admitting: Physical Therapy

## 2017-04-06 ENCOUNTER — Ambulatory Visit: Payer: Medicare PPO | Admitting: Physical Therapy

## 2017-04-06 DIAGNOSIS — M6281 Muscle weakness (generalized): Secondary | ICD-10-CM

## 2017-04-06 DIAGNOSIS — R2681 Unsteadiness on feet: Secondary | ICD-10-CM

## 2017-04-06 DIAGNOSIS — R2689 Other abnormalities of gait and mobility: Secondary | ICD-10-CM | POA: Diagnosis not present

## 2017-04-06 NOTE — Therapy (Signed)
[facility] [address] [place], [name], [address] Phone: [phone]   Fax:  [phone]  Physical Therapy Treatment  Patient Details  Name: Darrell [name] MRN: [id number] Date of Birth: 12-05-1950 Referring Provider: Dr. Adrian Blackwater  Encounter Date: 04/06/2017      PT End of Session - 04/06/17 1557    Visit Number 4   Number of Visits 9   Date for PT Re-Evaluation 04/12/17   Authorization Type [employer] (G-CODE AND PN every 10th visit to be sure).   PT Start Time 0930   PT Stop Time 1015   PT Time Calculation (min) 45 min   Equipment Utilized During Treatment Gait belt   Activity Tolerance Patient tolerated treatment well   Behavior During Therapy WFL for tasks assessed/performed      Past Medical History:  Diagnosis Date  . Chronic kidney disease    stage 3 GFR 30-59 ml/min   . Dysphagia as late effect of cerebrovascular disease    pts wife states pt has to eat soft foods   . GERD (gastroesophageal reflux disease)   . Glaucoma   . High cholesterol   . Hypertension   . Neuromuscular disorder ([facility])    chronic inflammatory demyelinating polyneuropathy   . Stroke [facility])    2011 with residual deficit left sided weakness  . Tobacco dependence     Past Surgical History:  Procedure Laterality Date  . EYE SURGERY    . INGUINAL HERNIA REPAIR Right 11/23/2014   Procedure: right inguinal hernia repair with mesh;  Surgeon: Armandina Gemma, MD;  [facility]: [facility];  Service: General;  Laterality: Right;  . INSERTION OF MESH N/A 11/23/2014   Procedure: INSERTION OF MESH;  Surgeon: Armandina Gemma, MD;  [facility]: [facility];  Service: General;  Laterality: N/A;  . PACEMAKER IMPLANT N/A 01/16/2017   Procedure: Pacemaker Implant;  Surgeon: Will Meredith Leeds, MD;  Location: [facility];  Service: Cardiovascular;  Laterality: N/A;  . SHOULDER SURGERY Bilateral 1988, 1998    There were no vitals filed for this visit.       Subjective Assessment - 04/06/17 0928    Subjective Pt denied falls or changes since last visit.    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    Currently in Pain? No/denies                         [facility] Adult PT Treatment/Exercise - 04/06/17 1136      Transfers   Transfers Sit to Stand;Stand to Sit   Sit to Stand 4: Min guard;4: Min assist   Sit to Stand Details (indicate cue type and reason) repeated vc and facilitation for sequence, anterior wt-shift   Stand to Sit 4: Min assist   Stand to Sit Details vc, demonstration, and facilitation for anterior wt-shift as descending to chair (practiced with hands on knees vs hands staying on RW (pt did best with hands on RW to help maintain fwd wt-shift)   Number of Reps 10 reps     Ambulation/Gait   Ambulation/Gait Assistance 4: Min assist   Ambulation/Gait Assistance Details constant cues for sequencing, proximity to RW (including step length)   Ambulation Distance (Feet) 30 Feet  x 2   Assistive device Rolling walker   Gait Pattern Step-to pattern;Decreased stance time - left;Decreased step length - right;Decreased stride length;Decreased hip/knee flexion - left;Decreased dorsiflexion - left;Decreased weight shift to left;Left flexed knee in stance;Trunk flexed   Ambulation Surface Level;Indoor     Wheelchair Mobility   Distance 100   Comments use feet only; vc for slower pace to allow heel to foot flat before advancing to next foot     Exercises   Exercises Knee/Hip     Knee/Hip Exercises: Stretches   Active Hamstring Stretch Both;2 reps;30 seconds   Active Hamstring Stretch Limitations seated   Piriformis Stretch Both;2 reps;30 seconds   Piriformis Stretch Limitations in sitting, cross ankle over opposite knee                   PT Short Term Goals - 04/06/17 1612      PT SHORT TERM GOAL #1   Title STGs same as LTGs           PT Long Term Goals - 04/06/17 1612      PT LONG TERM GOAL #1   Title Pt will effectively perform HEP with assist of wife to maximize functional gains made in PT.  TARGET DATE FOR ALL LTGS: 04/10/17 (New target: 7/27 due to missed week 7/2-7/6)   Time 4   Period Weeks   Status On-going     PT LONG TERM GOAL #2   Title Pt will perform STS txf to/from w/c with RW and S to improve safety during functional mobility.    Time 4   Period Weeks   Status On-going     PT LONG TERM GOAL #3   Title Pt will perform TUG time in </=100 seconds with RW in order to improve safety during functional mobility and decr. falls risk.    Time 4   Period Weeks   Status On-going     PT LONG TERM GOAL #4   Title Perform BERG and write goal as appropriate.   Baseline 6/25 5/56; no goal set as pt reliant on RW for UE support and do not anticipate a minimum clinically important difference within 4 weeks.   Time 4   Period Weeks   Status Achieved     PT LONG TERM GOAL #5   Title Pt will improve gait speed to >/=1.26ft/sec. with RW to improve functional mobility during househould mobility.    Time 4   Period Weeks   Status On-going     PT LONG TERM GOAL #6   Title Pt will amb. 150' with RW, with close S of wife, in order to safely amb. at home.   Time 4   Period Weeks   Status On-going               Plan - 04/06/17 1603    Clinical Impression Statement Session focused on safety with transfers and gait. Patient requires verbal cues for sequencing and technique 100% of transfers and manual facilitation for anterior weight-shift 75% of sit to stand transfers and 100% of stand to sit. Wife present throughout session and observed techniques for safer transfers and gait. Noted patient's POC was for 4 weeks and he was unable to be seen for week of 7/2-7/6 due to clinic  schedule. Had pt/wife add one more week of appointments, updated LTG date, and will need to recertify as current certification will expire 04/12/17.    Rehab Potential Fair   Clinical Impairments  Affecting Rehab Potential see above   PT Frequency 2x / week   PT Duration 4 weeks   PT Treatment/Interventions ADLs/Self Care Home Management;Biofeedback;Electrical Stimulation;Cognitive remediation;Neuromuscular re-education;Balance training;Therapeutic exercise;Manual techniques;Therapeutic activities;Functional mobility training;Stair training;Gait training;Orthotic Fit/Training;DME Instruction;Patient/family education;Vestibular   PT Next Visit Plan gait training and begin to have wife walk with pt (if approp); work on stand-pivot transfer w/c to bed with RW (see goal); Add to balance/strength/flexibility HEP; **will need recertification by 0/05 due to missed week of 7/2-7/6   Consulted and Agree with Plan of Care Family member/caregiver;Patient   Family Member Consulted wife: [name]      Patient will benefit from skilled therapeutic intervention in order to improve the following deficits and impairments:  Abnormal gait, Decreased endurance, Impaired sensation, Impaired tone, Decreased knowledge of use of DME, Decreased strength, Impaired UE functional use, Decreased balance, Decreased mobility, Decreased range of motion, Decreased coordination, Impaired flexibility, Postural dysfunction  Visit Diagnosis: Other abnormalities of gait and mobility  Unsteadiness on feet  Muscle weakness (generalized)     Problem List Patient Active Problem List   Diagnosis Date Noted  . Status cardiac pacemaker 01/29/2017  . Unintended weight loss 01/29/2017  . Tinea pedis of right foot 01/29/2017  . Hypercalcemia 01/15/2017  . Heart block 01/15/2017  . Constipation 09/12/2016  . Glaucoma 09/12/2016  . Liver hemangioma 08/14/2016  . Renal cyst 08/14/2016  . Renal mass, right 08/14/2016  . Elevated  liver enzymes 08/10/2016  . CKD (chronic kidney disease) stage 3, GFR 30-59 ml/min 08/16/2015  . At high risk for falls 08/16/2015  . Subcutaneous nodules 08/16/2015  . Prediabetes 10/07/2013  . Inguinal hernia 03/25/2013  . History of CVA with residual deficit 03/25/2013  . HTN (hypertension) 10/09/2012  . Tobacco abuse 10/09/2012    Rexanne Mano, PT 04/06/2017, 4:14 PM  [facility] [address] [place], [name], [address] Phone: [phone]   Fax:  [phone]  Name: Darrell [name] MRN: [id number] Date of Birth: 04-16-1951

## 2017-04-10 ENCOUNTER — Ambulatory Visit: Payer: Medicare PPO

## 2017-04-10 VITALS — BP 103/66 | HR 99

## 2017-04-10 DIAGNOSIS — R2681 Unsteadiness on feet: Secondary | ICD-10-CM

## 2017-04-10 DIAGNOSIS — M6281 Muscle weakness (generalized): Secondary | ICD-10-CM

## 2017-04-10 DIAGNOSIS — R2689 Other abnormalities of gait and mobility: Secondary | ICD-10-CM

## 2017-04-10 NOTE — Therapy (Signed)
[facility] [address] [place], [name], [address] Phone: [phone]   Fax:  [phone]  Physical Therapy Treatment  Patient Details  Name: Darrell [name] MRN: [id number] Date of Birth: 11-24-1950 Referring Provider: Dr. Adrian Blackwater  Encounter Date: 04/10/2017      PT End of Session - 04/10/17 1053    Visit Number 5   Number of Visits 9   Date for PT Re-Evaluation 04/12/17   Authorization Type [employer] (G-CODE AND PN every 10th visit to be sure).   PT Start Time [phone]   PT Stop Time 1013   PT Time Calculation (min) 44 min   Equipment Utilized During Treatment Gait belt   Activity Tolerance Patient tolerated treatment well   Behavior During Therapy WFL for tasks assessed/performed      Past Medical History:  Diagnosis Date  . Chronic kidney disease    stage 3 GFR 30-59 ml/min   . Dysphagia as late effect of cerebrovascular disease    pts wife states pt has to eat soft foods   . GERD (gastroesophageal reflux disease)   . Glaucoma   . High cholesterol   . Hypertension   . Neuromuscular disorder ([place])    chronic inflammatory demyelinating polyneuropathy   . Stroke [facility])    2011 with residual deficit left sided weakness  . Tobacco dependence     Past Surgical History:  Procedure Laterality Date  . EYE SURGERY    . INGUINAL HERNIA REPAIR Right 11/23/2014   Procedure: right inguinal hernia repair with mesh;  Surgeon: Armandina Gemma, MD;  Location: [facility];  Service: General;  Laterality: Right;  . INSERTION OF MESH N/A 11/23/2014   Procedure: INSERTION OF MESH;  Surgeon: Armandina Gemma, MD;  Location: [facility];  Service: General;  Laterality: N/A;  . PACEMAKER IMPLANT N/A 01/16/2017   Procedure: Pacemaker Implant;  Surgeon: Will Meredith Leeds, MD;  Location: [facility];  Service: Cardiovascular;  Laterality: N/A;  . SHOULDER SURGERY Bilateral 1988, 1998    Vitals:   04/10/17 1015 04/10/17 1016  BP: 101/67  103/66  Pulse: 89 99        Subjective Assessment - 04/10/17 0932    Subjective Pt denied changes since last visit. Pt did have near fall this morning, during txfs.    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    Currently in Pain? No/denies                         [facility] Adult PT Treatment/Exercise - 04/10/17 0933      Transfers   Transfers Sit to Stand;Stand to Sit;Stand Pivot Transfers   Sit to Stand 4: Min guard;4: Min assist;With upper extremity assist;From chair/3-in-1   Sit to Stand Details Tactile cues for sequencing;Tactile cues for weight shifting;Tactile cues for posture;Tactile cues for placement;Verbal cues for sequencing;Verbal cues for technique;Verbal cues for precautions/safety   Sit to Stand Details (indicate cue type and reason) Extensive cues for sequencing and proper/safe technique to improve ant. weight shifting. Pt progressed to min guard for safety.   Stand to Sit 4: Min guard;4: Min assist;With upper extremity assist;To chair/3-in-1   Stand to Sit Details (indicate cue type and reason) Tactile cues  for sequencing;Tactile cues for initiation;Tactile cues for weight shifting;Tactile cues for posture;Verbal cues for sequencing;Verbal cues for technique;Verbal cues for precautions/safety;Verbal cues for safe use of DME/AE   Stand to Sit Details Cues to improve eccentric control, used elevated mat 2/2 pt's height. Cues for hand placement   Stand Pivot Transfers 4: Min assist;From elevated surface   Stand Pivot Transfer Details (indicate cue type and reason) Mat<>w/c txfs with RW and cues for sequencing and safety. Mat elevated, w/c no cushion.   Number of Reps Other reps (comment)  12 reps     Ambulation/Gait   Ambulation/Gait Yes   Ambulation/Gait Assistance 4: Min  assist   Ambulation/Gait Assistance Details Extensive cues for sequencing with RW and safety, cues to improve stride length, heel strike and upright posture. Intermittent tremors noted during amb. BP WNL after amb.    Ambulation Distance (Feet) 34 Feet  51' and 20'   Assistive device Rolling walker   Gait Pattern Step-to pattern;Decreased stance time - left;Decreased step length - right;Decreased stride length;Decreased hip/knee flexion - left;Decreased dorsiflexion - left;Decreased weight shift to left;Left flexed knee in stance;Trunk flexed   Ambulation Surface Level;Indoor                  PT Short Term Goals - 04/06/17 1612      PT SHORT TERM GOAL #1   Title STGs same as LTGs           PT Long Term Goals - 04/06/17 1612      PT LONG TERM GOAL #1   Title Pt will effectively perform HEP with assist of wife to maximize functional gains made in PT.  TARGET DATE FOR ALL LTGS: 04/10/17 (New target: 7/27 due to missed week 7/2-7/6)   Time 4   Period Weeks   Status On-going     PT LONG TERM GOAL #2   Title Pt will perform STS txf to/from w/c with RW and S to improve safety during functional mobility.    Time 4   Period Weeks   Status On-going     PT LONG TERM GOAL #3   Title Pt will perform TUG time in </=100 seconds with RW in order to improve safety during functional mobility and decr. falls risk.    Time 4   Period Weeks   Status On-going     PT LONG TERM GOAL #4   Title Perform BERG and write goal as appropriate.   Baseline 6/25 5/56; no goal set as pt reliant on RW for UE support and do not anticipate a minimum clinically important difference within 4 weeks.   Time 4   Period Weeks   Status Achieved     PT LONG TERM GOAL #5   Title Pt will improve gait speed to >/=1.50ft/sec. with RW to improve functional mobility during househould mobility.    Time 4   Period Weeks   Status On-going     PT LONG TERM GOAL #6   Title Pt will amb. 150' with RW, with close  S of wife, in order to safely amb. at home.   Time 4   Period Weeks   Status On-going               Plan - 04/10/17 1053    Clinical Impression Statement Pt demonstrated progress as he was able to progress from min A during STS txfs to min guard. Pt continues to requires extensive cues during gait to improve deviations and to  ensure safety. Pt required seated rest breaks after each bout of amb. 2/2 fatigue. Pt would continue to benefit from skilled PT to improve safety during functional mobility.  PT requesting additional 2x/week for 2 weeks in order to address time missed, due to schedule conflicts.    Rehab Potential Fair   Clinical Impairments Affecting Rehab Potential see above   PT Frequency 2x / week   PT Duration 2 weeks  original POC: 2x/week for 4 weeks   PT Treatment/Interventions ADLs/Self Care Home Management;Biofeedback;Electrical Stimulation;Cognitive remediation;Neuromuscular re-education;Balance training;Therapeutic exercise;Manual techniques;Therapeutic activities;Functional mobility training;Stair training;Gait training;Orthotic Fit/Training;DME Instruction;Patient/family education;Vestibular   PT Next Visit Plan gait training and begin to have wife walk with pt (if approp); work on stand-pivot transfer w/c to bed with RW (see goal); Add to balance/strength/flexibility HEP   Consulted and Agree with Plan of Care Family member/caregiver;Patient   Family Member Consulted wife: [name]      Patient will benefit from skilled therapeutic intervention in order to improve the following deficits and impairments:  Abnormal gait, Decreased endurance, Impaired sensation, Impaired tone, Decreased knowledge of use of DME, Decreased strength, Impaired UE functional use, Decreased balance, Decreased mobility, Decreased range of motion, Decreased coordination, Impaired flexibility, Postural dysfunction  Visit Diagnosis: Other abnormalities of gait and mobility - Plan: PT plan of care  cert/re-cert  Unsteadiness on feet - Plan: PT plan of care cert/re-cert  Muscle weakness (generalized) - Plan: PT plan of care cert/re-cert     Problem List Patient Active Problem List   Diagnosis Date Noted  . Status cardiac pacemaker 01/29/2017  . Unintended weight loss 01/29/2017  . Tinea pedis of right foot 01/29/2017  . Hypercalcemia 01/15/2017  . Heart block 01/15/2017  . Constipation 09/12/2016  . Glaucoma 09/12/2016  . Liver hemangioma 08/14/2016  . Renal cyst 08/14/2016  . Renal mass, right 08/14/2016  . Elevated liver enzymes 08/10/2016  . CKD (chronic kidney disease) stage 3, GFR 30-59 ml/min 08/16/2015  . At high risk for falls 08/16/2015  . Subcutaneous nodules 08/16/2015  . Prediabetes 10/07/2013  . Inguinal hernia 03/25/2013  . History of CVA with residual deficit 03/25/2013  . HTN (hypertension) 10/09/2012  . Tobacco abuse 10/09/2012    Amyra Vantuyl L 04/10/2017, 10:57 AM  [facility] [address] [place], [name], [address] Phone: [phone]   Fax:  [phone]  Name: Darrell [name] MRN: [id number] Date of Birth: [birth year]  Geoffry Paradise, PT,DPT 04/10/17 10:57 AM Phone: [phone] Fax: [phone]

## 2017-04-13 ENCOUNTER — Encounter: Payer: Self-pay | Admitting: Physical Therapy

## 2017-04-13 ENCOUNTER — Ambulatory Visit: Payer: Medicare PPO | Admitting: Physical Therapy

## 2017-04-13 DIAGNOSIS — R2689 Other abnormalities of gait and mobility: Secondary | ICD-10-CM

## 2017-04-13 DIAGNOSIS — I6930 Unspecified sequelae of cerebral infarction: Secondary | ICD-10-CM

## 2017-04-13 DIAGNOSIS — R2681 Unsteadiness on feet: Secondary | ICD-10-CM

## 2017-04-13 NOTE — Patient Instructions (Addendum)
Knee to Chest For hip flexor stretch    Lying supine, bend one knee to your chest and hold with your hands. The other leg stays flat on the bed. Hold 30 seconds.  Repeat with other leg. Do 2 on each leg.  Do _2-3__ times per day.  Copyright  VHI. All rights reserved.

## 2017-04-14 NOTE — Therapy (Signed)
[facility] [address] [place], [name], [address] Phone: [phone]   Fax:  [phone]  Physical Therapy Treatment  Patient Details  Name: Darrell [name] MRN: [id number] Date of Birth: September 27, 1950 Referring Provider: Dr. Adrian Blackwater  Encounter Date: 04/13/2017      PT End of Session - 04/14/17 1014    Visit Number 6   Number of Visits 9   Date for PT Re-Evaluation 04/12/17   Authorization Type [employer] (G-CODE AND PN every 10th visit to be sure).   PT Start Time 0930   PT Stop Time 1017   PT Time Calculation (min) 47 min   Equipment Utilized During Treatment Gait belt   Activity Tolerance Patient tolerated treatment well   Behavior During Therapy WFL for tasks assessed/performed      Past Medical History:  Diagnosis Date  . Chronic kidney disease    stage 3 GFR 30-59 ml/min   . Dysphagia as late effect of cerebrovascular disease    pts wife states pt has to eat soft foods   . GERD (gastroesophageal reflux disease)   . Glaucoma   . High cholesterol   . Hypertension   . Neuromuscular disorder ([place])    chronic inflammatory demyelinating polyneuropathy   . Stroke [facility])    2011 with residual deficit left sided weakness  . Tobacco dependence     Past Surgical History:  Procedure Laterality Date  . EYE SURGERY    . INGUINAL HERNIA REPAIR Right 11/23/2014   Procedure: right inguinal hernia repair with mesh;  Surgeon: Armandina Gemma, MD;  Location: [facility];  Service: General;  Laterality: Right;  . INSERTION OF MESH N/A 11/23/2014   Procedure: INSERTION OF MESH;  Surgeon: Armandina Gemma, MD;  Location: [facility];  Service: General;  Laterality: N/A;  . PACEMAKER IMPLANT N/A 01/16/2017   Procedure: Pacemaker Implant;  Surgeon: Will Meredith Leeds, MD;  Location: [facility];  Service: Cardiovascular;  Laterality: N/A;  . SHOULDER SURGERY Bilateral 1988, 1998    There were no vitals filed for this visit.       Subjective Assessment - 04/13/17 0931    Subjective (P)  Denies any near falls.    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    Currently in Pain? (P)  No/denies                         [facility] Adult PT Treatment/Exercise - 04/14/17 0001      Bed Mobility   Bed Mobility Supine to Sit;Sit to Supine   Supine to Sit 6: Modified independent (Device/Increase time)   Sit to Supine 6: Modified independent (Device/Increase time)     Transfers   Transfers Sit to Stand;Stand to Sit;Stand Pivot Transfers;Squat Pivot Transfers   Sit to Stand 4: Min assist;3: Mod assist;With upper extremity assist;From bed;From chair/3-in-1   Sit to Stand Details (indicate cue type and reason) Extensive cues for sequencing and proper/safe technique to improve ant. weight shifting. Pt varied min to mod assist for safety due to posterior bias/lean   Stand to Sit 4: Min assist;4: Min guard   Stand to Sit Details cues and assist for anterior wt-shift to  control descent; constant cues for hand placmeent   Stand Pivot Transfers 4: Min assist;3: Mod assist   Stand Pivot Transfer Details (indicate cue type and reason) with RW requiring constant assist with RW placement and balance; 100% cues for sequencing and placement of feet/RW   Squat Pivot Transfers 4: Min guard;4: Min assist;3: Mod Psychiatric nurse Details (indicate cue type and reason) incr difficulty to his right   Number of Reps --  10 stand-pivot; 10 squat-pivot looking for carryover of sequ   Comments wife reports they always use RW for stand-pivot     Ambulation/Gait   Ambulation/Gait Assistance 4: Min assist   Ambulation/Gait Assistance Details Extensive cues for sequencing with RW and safety, cues to improve stride length, heel  strike and upright posture. Intermittent tremors noted during amb. Required more assist than when last seen by this therapist and did not feel safe to have wife (who is very petite compared to pt) walk with pt.    Ambulation Distance (Feet) 40 Feet   Assistive device Rolling walker   Gait Pattern Step-to pattern;Decreased stance time - left;Decreased step length - right;Decreased stride length;Decreased hip/knee flexion - left;Decreased dorsiflexion - left;Decreased weight shift to left;Left flexed knee in stance;Trunk flexed   Ambulation Surface Level     Knee/Hip Exercises: Supine   Other Supine Knee/Hip Exercises double knee to chest and then relax one leg back down straight on mat for hip flexor and knee extensor stretch; bil x 2 x 30 sec                PT Education - 04/14/17 1013    Education provided Yes   Education Details wife re: transfers and HEP; not safe to walk with pt herself   Person(s) Educated Patient;Spouse   Methods Explanation;Demonstration;Handout   Comprehension Verbalized understanding;Returned demonstration;Need further instruction          PT Short Term Goals - 04/06/17 1612      PT SHORT TERM GOAL #1   Title STGs same as LTGs           PT Long Term Goals - 04/06/17 1612      PT LONG TERM GOAL #1   Title Pt will effectively perform HEP with assist of wife to maximize functional gains made in PT.  TARGET DATE FOR ALL LTGS: 04/10/17 (New target: 7/27 due to missed week 7/2-7/6)   Time 4   Period Weeks   Status On-going     PT LONG TERM GOAL #2   Title Pt will perform STS txf to/from w/c with RW and S to improve safety during functional mobility.    Time 4   Period Weeks   Status On-going     PT LONG TERM GOAL #3   Title Pt will perform TUG time in </=100 seconds with RW in order to improve safety during functional mobility and decr. falls risk.    Time 4   Period Weeks   Status On-going     PT LONG TERM GOAL #4   Title Perform BERG  and write goal as appropriate.   Baseline 6/25 5/56; no goal set as pt reliant on RW for UE support and do not anticipate a minimum clinically important difference within 4 weeks.   Time 4   Period Weeks   Status Achieved     PT LONG TERM GOAL #5   Title Pt will improve gait speed to >/=1.38ft/sec. with RW to improve functional mobility  during househould mobility.    Time 4   Period Weeks   Status On-going     PT LONG TERM GOAL #6   Title Pt will amb. 150' with RW, with close S of wife, in order to safely amb. at home.   Time 4   Period Weeks   Status On-going               Plan - 04/14/17 1014    Clinical Impression Statement Initial portion of session wife not present and focused on repeated transfers (pt reported they did not use RW at home and did squat-pivots; when wife arrived she reported they do stand-pivot with RW). Patient reequired incr assist compared to last time this therapist worked with him, therefore did not progress to having his wife walk with him. Question if this is going to be a goal they can achieve and discussed with wife. Will continue to work towards improving function and safety for both pt and wife.    Rehab Potential Fair   Clinical Impairments Affecting Rehab Potential see above   PT Frequency 2x / week   PT Duration 2 weeks  original POC: 2x/week for 4 weeks   PT Treatment/Interventions ADLs/Self Care Home Management;Biofeedback;Electrical Stimulation;Cognitive remediation;Neuromuscular re-education;Balance training;Therapeutic exercise;Manual techniques;Therapeutic activities;Functional mobility training;Stair training;Gait training;Orthotic Fit/Training;DME Instruction;Patient/family education;Vestibular   PT Next Visit Plan gait training and begin to have wife walk with pt (if approp)--began discussion this may not be possible with his varied ability; work on stand-pivot transfer w/c to bed with RW (see goal); Add to balance/strength/flexibility  HEP   Consulted and Agree with Plan of Care Family member/caregiver;Patient   Family Member Consulted wife: [name]      Patient will benefit from skilled therapeutic intervention in order to improve the following deficits and impairments:  Abnormal gait, Decreased endurance, Impaired sensation, Impaired tone, Decreased knowledge of use of DME, Decreased strength, Impaired UE functional use, Decreased balance, Decreased mobility, Decreased range of motion, Decreased coordination, Impaired flexibility, Postural dysfunction  Visit Diagnosis: Other abnormalities of gait and mobility  Unsteadiness on feet  History of CVA with residual deficit     Problem List Patient Active Problem List   Diagnosis Date Noted  . Status cardiac pacemaker 01/29/2017  . Unintended weight loss 01/29/2017  . Tinea pedis of right foot 01/29/2017  . Hypercalcemia 01/15/2017  . Heart block 01/15/2017  . Constipation 09/12/2016  . Glaucoma 09/12/2016  . Liver hemangioma 08/14/2016  . Renal cyst 08/14/2016  . Renal mass, right 08/14/2016  . Elevated liver enzymes 08/10/2016  . CKD (chronic kidney disease) stage 3, GFR 30-59 ml/min 08/16/2015  . At high risk for falls 08/16/2015  . Subcutaneous nodules 08/16/2015  . Prediabetes 10/07/2013  . Inguinal hernia 03/25/2013  . History of CVA with residual deficit 03/25/2013  . HTN (hypertension) 10/09/2012  . Tobacco abuse 10/09/2012    Jeanie Cooks Haya Hemler. PT 04/14/2017, 10:19 AM  [facility] [address] [place], [name], [address] Phone: [phone]   Fax:  [phone]  Name: MAXFIELD GILDERSLEEVE MRN: [id number] Date of Birth: 1950/10/16

## 2017-04-16 ENCOUNTER — Ambulatory Visit: Payer: Medicare PPO | Admitting: Rehabilitation

## 2017-04-17 ENCOUNTER — Encounter: Payer: Self-pay | Admitting: Cardiology

## 2017-04-17 ENCOUNTER — Ambulatory Visit (INDEPENDENT_AMBULATORY_CARE_PROVIDER_SITE_OTHER): Payer: Medicare PPO | Admitting: Cardiology

## 2017-04-17 VITALS — BP 128/80 | HR 75 | Ht 76.0 in | Wt 180.0 lb

## 2017-04-17 DIAGNOSIS — I441 Atrioventricular block, second degree: Secondary | ICD-10-CM | POA: Diagnosis not present

## 2017-04-17 DIAGNOSIS — I10 Essential (primary) hypertension: Secondary | ICD-10-CM | POA: Diagnosis not present

## 2017-04-17 LAB — CUP PACEART INCLINIC DEVICE CHECK
Battery Remaining Longevity: 137 mo
Battery Voltage: 3.17 V
Brady Statistic AS VP Percent: 97.98 %
Brady Statistic RA Percent Paced: 1.25 %
Brady Statistic RV Percent Paced: 98.85 %
Date Time Interrogation Session: 20180724134329
Implantable Lead Implant Date: 20180424
Implantable Lead Location: 753859
Implantable Lead Model: 5076
Implantable Lead Model: 5076
Implantable Pulse Generator Implant Date: 20180424
Lead Channel Impedance Value: 304 Ohm
Lead Channel Impedance Value: 399 Ohm
Lead Channel Pacing Threshold Amplitude: 0.75 V
Lead Channel Pacing Threshold Pulse Width: 0.4 ms
Lead Channel Pacing Threshold Pulse Width: 0.4 ms
Lead Channel Sensing Intrinsic Amplitude: 1.125 mV
Lead Channel Setting Pacing Amplitude: 2.5 V
Lead Channel Setting Pacing Pulse Width: 0.4 ms
Lead Channel Setting Sensing Sensitivity: 2 mV
MDC IDC LEAD IMPLANT DT: 20180424
MDC IDC LEAD LOCATION: 753860
MDC IDC MSMT LEADCHNL RV IMPEDANCE VALUE: 418 Ohm
MDC IDC MSMT LEADCHNL RV IMPEDANCE VALUE: 513 Ohm
MDC IDC MSMT LEADCHNL RV PACING THRESHOLD AMPLITUDE: 0.5 V
MDC IDC MSMT LEADCHNL RV SENSING INTR AMPL: 14.125 mV
MDC IDC SET LEADCHNL RA PACING AMPLITUDE: 1.5 V
MDC IDC STAT BRADY AP VP PERCENT: 0.88 %
MDC IDC STAT BRADY AP VS PERCENT: 0.04 %
MDC IDC STAT BRADY AS VS PERCENT: 1.1 %

## 2017-04-17 MED ORDER — METOPROLOL TARTRATE 25 MG PO TABS: 25.0000 mg | ORAL_TABLET | Freq: Two times a day (BID) | ORAL | 3 refills | Status: DC

## 2017-04-17 NOTE — Progress Notes (Signed)
Electrophysiology Office Note   Date:  04/17/2017   ID:  SI JACHIM, DOB 09/02/1951, MRN [id number]  PCP:  Boykin Nearing, MD  Cardiologist:  none Primary Electrophysiologist:  Tarris Delbene Meredith Leeds, MD    Chief Complaint  Patient presents with  . Pacemaker Check     History of Present Illness: Darrell [name] is a 66 y.o. male who is being seen today for the evaluation of heart block at the request of Boykin Nearing, MD. Presenting today for electrophysiology evaluation. Presented to the hospital when he discovered his HR was in the 40s, found to be in heart block with 2:1 conduction. Medtronic dual chamber pacemaker implanted 01/16/17..     Today, he denies symptoms of palpitations, chest pain, shortness of breath, orthopnea, PND, lower extremity edema, claudication, dizziness, presyncope, syncope, bleeding, or neurologic sequela. The patient is tolerating medications without difficulties. He is been going to physical therapy over the last few weeks. He has been working mainly on standing and walking. He feels like he is getting quite a bit stronger.  Past Medical History:  Diagnosis Date  . Chronic kidney disease    stage 3 GFR 30-59 ml/min   . Dysphagia as late effect of cerebrovascular disease    pts wife states pt has to eat soft foods   . GERD (gastroesophageal reflux disease)   . Glaucoma   . High cholesterol   . Hypertension   . Neuromuscular disorder ([place])    chronic inflammatory demyelinating polyneuropathy   . Stroke [facility])    2011 with residual deficit left sided weakness  . Tobacco dependence    Past Surgical History:  Procedure Laterality Date  . EYE SURGERY    . INGUINAL HERNIA REPAIR Right 11/23/2014   Procedure: right inguinal hernia repair with mesh;  Surgeon: Armandina Gemma, MD;  Location: [facility];  Service: General;  Laterality: Right;  . INSERTION OF MESH N/A 11/23/2014   Procedure: INSERTION OF MESH;  Surgeon: Armandina Gemma, MD;  Location: [facility];   Service: General;  Laterality: N/A;  . PACEMAKER IMPLANT N/A 01/16/2017   Procedure: Pacemaker Implant;  Surgeon: Kross Swallows Meredith Leeds, MD;  Location: [place] [facility];  Service: Cardiovascular;  Laterality: N/A;  . SHOULDER SURGERY Bilateral 1988, 1998     Current Outpatient Prescriptions  Medication Sig Dispense Refill  . amLODipine (NORVASC) 10 MG tablet Take 1 tablet (10 mg total) by mouth every morning. 90 tablet 2  . aspirin EC 81 MG tablet Take 1 tablet (81 mg total) by mouth daily. 90 tablet 3  . atorvastatin (LIPITOR) 40 MG tablet Take 1 tablet (40 mg total) by mouth daily. 90 tablet 3  . brimonidine (ALPHAGAN P) 0.1 % SOLN Place 1 drop into both eyes 2 (two) times daily.    . dorzolamide-timolol (COSOPT) 22.3-6.8 MG/ML ophthalmic solution Place 1 drop into both eyes 2 (two) times daily.    . fluticasone (FLONASE) 50 MCG/ACT nasal spray Place 2 sprays into both nostrils daily. 16 g 6  . hydrALAZINE (APRESOLINE) 50 MG tablet Take 1 tablet (50 mg total) by mouth 3 (three) times daily. 270 tablet 3  . latanoprost (XALATAN) 0.005 % ophthalmic solution Place 1 drop into both eyes at bedtime.    Marland Kitchen lisinopril (PRINIVIL,ZESTRIL) 20 MG tablet Take 1 tablet (20 mg total) by mouth daily. 90 tablet 3  . Multiple Vitamins-Iron (DAILY VITAMIN FORMULA+IRON) TABS Take 1 tablet by mouth daily.    . Psyllium (EQ DAILY FIBER PO) Take  1 tablet by mouth daily.    Marland Kitchen terbinafine (LAMISIL AT) 1 % cream Apply 1 application topically 2 (two) times daily. To R foot 30 g 0  . torsemide (DEMADEX) 20 MG tablet Take 20 mg by mouth daily.    . varenicline (CHANTIX CONTINUING MONTH PAK) 1 MG tablet Take 1 tablet (1 mg total) by mouth 2 (two) times daily. For PASS 60 tablet 2  . varenicline (CHANTIX STARTING MONTH PAK) 0.5 MG X 11 & 1 MG X 42 tablet Take by mouth, Taper per packet insert 53 tablet 0   No current facility-administered medications for this visit.     Allergies:   Doxycycline; Atacand hct  [candesartan cilexetil-hctz]; and Shellfish allergy   Social History:  The patient  reports that he has been smoking Cigarettes.  He has a 40.00 pack-year smoking history. He has never used smokeless tobacco. He reports that he does not drink alcohol or use drugs.   Family History:  The patient's family history includes Diabetes in his sister; Hypertension in his mother and sister.    ROS:  Please see the history of present illness.   Otherwise, review of systems is positive for none.   All other systems are reviewed and negative.    PHYSICAL EXAM: VS:  BP 128/80   Pulse 75   Ht 6\' 4"  (1.93 m)   Wt 180 lb (81.6 kg)   SpO2 97%   BMI 21.91 kg/m  , BMI Body mass index is 21.91 kg/m. GEN: Well nourished, well developed, in no acute distress  HEENT: normal  Neck: no JVD, carotid bruits, or masses Cardiac: RRR; no murmurs, rubs, or gallops,no edema  Respiratory:  clear to auscultation bilaterally, normal work of breathing GI: soft, nontender, nondistended, + BS MS: no deformity or atrophy  Skin: warm and dry, device pocket is well healed Neuro:  Strength and sensation are intact Psych: euthymic mood, full affect  EKG:  EKG is ordered today. Personal review of the ekg ordered shows sinus rhythm, rate 75  Device interrogation is reviewed today in detail.  See PaceArt for details.   Recent Labs: 01/15/2017: ALT 9; TSH 1.606 02/09/2017: BUN 28; Creatinine, Ser 2.11; Hemoglobin 13.3; Platelets 309; Potassium 4.3; Sodium 138    Lipid Panel     Component Value Date/Time   CHOL 141 08/16/2015 1555   TRIG 116 08/16/2015 1555   HDL 34 (L) 08/16/2015 1555   CHOLHDL 4.1 08/16/2015 1555   VLDL 23 08/16/2015 1555   LDLCALC 84 08/16/2015 1555     Wt Readings from Last 3 Encounters:  04/17/17 180 lb (81.6 kg)  02/20/17 175 lb 9.6 oz (79.7 kg)  01/29/17 166 lb 12.8 oz (75.7 kg)      Other studies Reviewed: Additional studies/ records that were reviewed today include: TTE 2014    Review of the above records today demonstrates:  - Left ventricle: The cavity size was normal. There was severe concentric and mild asymmetric hypertrophy. Systolic function was normal. Wall motion was normal; there were no regional wall motion abnormalities. Doppler parameters are consistent with abnormal left ventricular relaxation (grade 1 diastolic dysfunction). - Aortic root: The aortic root was mildly dilated. - Right atrium: The atrium was mildly dilated. - Pulmonary arteries: Systolic pressure was mildly increased. PA peak pressure: 30mm Hg (S).  ASSESSMENT AND PLAN:  1.  2:1 AV block: s/p Medtronic pacemaker. Device is functioning appropriately today. He is in sinus rhythm today. Not requiring the pacing at this  time. Changes made to the device for long-term management. He did have a one-to-one tachycardia as well as an 8 beat run of ventricular tachycardia on his device. We'll plan for metoprolol 25 mg.  2. Hypertension: Blood pressure well controlled today. No changes to medications at this time.  Current medicines are reviewed at length with the patient today.   The patient does not have concerns regarding his medicines.  The following changes were made today:  Metoprolol  Labs/ tests ordered today include:  Orders Placed This Encounter  Procedures  . EKG 12-Lead     Disposition:   FU with Arisbel Maione 9 months  Signed, Sherin Murdoch Meredith Leeds, MD  04/17/2017 11:40 AM     [facility] [address] [place]  [place] [address] [phone] (office) [phone] (fax)

## 2017-04-17 NOTE — Patient Instructions (Addendum)
Medication Instructions:   Your physician has recommended you make the following change in your medication:  1. START Metoprolol Tartrate 25 mg twice a day  - If you need a refill on your cardiac medications before your next appointment, please call your pharmacy.   Labwork:  None ordered  Testing/Procedures:  None ordered  Follow-Up: Remote monitoring is used to monitor your Pacemaker of ICD from home. This monitoring reduces the number of office visits required to check your device to one time per year. It allows [place] to keep an eye on the functioning of your device to ensure it is working properly. You are scheduled for a device check from home on 07/20/2017. You may send your transmission at any time that day. If you have a wireless device, the transmission will be sent automatically. After your physician reviews your transmission, you will receive a postcard with your next transmission date.   Your physician wants you to follow-up in: 9 months with Dr. Curt Bears.  You will receive a reminder letter in the mail two months in advance. If you don't receive a letter, please call our office to schedule the follow-up appointment.  Thank you for choosing [facility]!!   Trinidad Curet, RN [phone]  Any Other Special Instructions Will Be Listed Below (If Applicable).   Metoprolol tablets What is this medicine? METOPROLOL (me TOE proe lole) is a beta-blocker. Beta-blockers reduce the workload on the heart and help it to beat more regularly. This medicine is used to treat high blood pressure and to prevent chest pain. It is also used to after a heart attack and to prevent an additional heart attack from occurring. This medicine may be used for other purposes; ask your health care provider or pharmacist if you have questions. COMMON BRAND NAME(S): Lopressor What should I tell my health care provider before I take this medicine? They need to know if you have any of these  conditions: -diabetes -heart or vessel disease like slow heart rate, worsening heart failure, heart block, sick sinus syndrome or Raynaud's disease -kidney disease -liver disease -lung or breathing disease, like asthma or emphysema -pheochromocytoma -thyroid disease -an unusual or allergic reaction to metoprolol, other beta-blockers, medicines, foods, dyes, or preservatives -pregnant or trying to get pregnant -breast-feeding How should I use this medicine? Take this medicine by mouth with a drink of water. Follow the directions on the prescription label. Take this medicine immediately after meals. Take your doses at regular intervals. Do not take more medicine than directed. Do not stop taking this medicine suddenly. This could lead to serious heart-related effects. Talk to your pediatrician regarding the use of this medicine in children. Special care may be needed. Overdosage: If you think you have taken too much of this medicine contact a poison control center or emergency room at once. NOTE: This medicine is only for you. Do not share this medicine with others. What if I miss a dose? If you miss a dose, take it as soon as you can. If it is almost time for your next dose, take only that dose. Do not take double or extra doses. What may interact with this medicine? This medicine may interact with the following medications: -certain medicines for blood pressure, heart disease, irregular heart beat -certain medicines for depression like monoamine oxidase (MAO) inhibitors, fluoxetine, or paroxetine -clonidine -dobutamine -epinephrine -isoproterenol -reserpine This list may not describe all possible interactions. Give your health care provider a list of all the medicines, herbs, non-prescription drugs,  or dietary supplements you use. Also tell them if you smoke, drink alcohol, or use illegal drugs. Some items may interact with your medicine. What should I watch for while using this  medicine? Visit your doctor or health care professional for regular check ups. Contact your doctor right away if your symptoms worsen. Check your blood pressure and pulse rate regularly. Ask your health care professional what your blood pressure and pulse rate should be, and when you should contact them. You may get drowsy or dizzy. Do not drive, use machinery, or do anything that needs mental alertness until you know how this medicine affects you. Do not sit or stand up quickly, especially if you are an older patient. This reduces the risk of dizzy or fainting spells. Contact your doctor if these symptoms continue. Alcohol may interfere with the effect of this medicine. Avoid alcoholic drinks. What side effects may I notice from receiving this medicine? Side effects that you should report to your doctor or health care professional as soon as possible: -allergic reactions like skin rash, itching or hives -cold or numb hands or feet -depression -difficulty breathing -faint -fever with sore throat -irregular heartbeat, chest pain -rapid weight gain -swollen legs or ankles Side effects that usually do not require medical attention (report to your doctor or health care professional if they continue or are bothersome): -anxiety or nervousness -change in sex drive or performance -dry skin -headache -nightmares or trouble sleeping -short term memory loss -stomach upset or diarrhea -unusually tired This list may not describe all possible side effects. Call your doctor for medical advice about side effects. You may report side effects to FDA at 1-800-FDA-1088. Where should I keep my medicine? Keep out of the reach of children. Store at room temperature between 15 and 30 degrees C (59 and 86 degrees F). Throw away any unused medicine after the expiration date. NOTE: This sheet is a summary. It may not cover all possible information. If you have questions about this medicine, talk to your doctor,  pharmacist, or health care provider.  2018 Elsevier/Gold Standard (2013-05-16 14:40:36)

## 2017-04-18 ENCOUNTER — Ambulatory Visit: Payer: Medicare PPO

## 2017-04-18 DIAGNOSIS — R2689 Other abnormalities of gait and mobility: Secondary | ICD-10-CM | POA: Diagnosis not present

## 2017-04-18 DIAGNOSIS — R2681 Unsteadiness on feet: Secondary | ICD-10-CM

## 2017-04-18 DIAGNOSIS — M6281 Muscle weakness (generalized): Secondary | ICD-10-CM

## 2017-04-18 NOTE — Therapy (Signed)
[facility] [address] [place], [name], [address] Phone: [phone]   Fax:  [phone]  Physical Therapy Treatment  Patient Details  Name: Darrell [name] MRN: [id number] Date of Birth: 1951/04/15 Referring Provider: Dr. Adrian Blackwater  Encounter Date: 04/18/2017      PT End of Session - 04/18/17 1544    Visit Number 7   Number of Visits 9   Date for PT Re-Evaluation 04/12/17   Authorization Type [employer] (G-CODE AND PN every 10th visit to be sure).   PT Start Time 1458   PT Stop Time 1537   PT Time Calculation (min) 39 min   Equipment Utilized During Treatment Gait belt   Activity Tolerance Patient tolerated treatment well   Behavior During Therapy WFL for tasks assessed/performed      Past Medical History:  Diagnosis Date  . Chronic kidney disease    stage 3 GFR 30-59 ml/min   . Dysphagia as late effect of cerebrovascular disease    pts wife states pt has to eat soft foods   . GERD (gastroesophageal reflux disease)   . Glaucoma   . High cholesterol   . Hypertension   . Neuromuscular disorder ([place])    chronic inflammatory demyelinating polyneuropathy   . Stroke [facility] [name] [facility])    2011 with residual deficit left sided weakness  . Tobacco dependence     Past Surgical History:  Procedure Laterality Date  . EYE SURGERY    . INGUINAL HERNIA REPAIR Right 11/23/2014   Procedure: right inguinal hernia repair with mesh;  Surgeon: Armandina Gemma, MD;  Location: [facility];  Service: General;  Laterality: Right;  . INSERTION OF MESH N/A 11/23/2014   Procedure: INSERTION OF MESH;  Surgeon: Armandina Gemma, MD;  Location: [facility];  Service: General;  Laterality: N/A;  . PACEMAKER IMPLANT N/A 01/16/2017   Procedure: Pacemaker Implant;  Surgeon: Will Meredith Leeds, MD;  Location: [place] [facility];  Service: Cardiovascular;  Laterality: N/A;  . SHOULDER SURGERY Bilateral 1988, 1998    There were no vitals filed for this visit.       Subjective Assessment - 04/18/17 1500    Subjective Pt denied falls or changes since last visit.    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    Currently in Pain? No/denies                         [facility] Adult PT Treatment/Exercise - 04/18/17 1502      Transfers   Transfers Sit to Stand;Stand to Sit;Stand Pivot Transfers;Squat Pivot Transfers   Sit to Stand 4: Min assist;3: Mod assist;With upper extremity assist;From bed;From chair/3-in-1   Sit to Stand Details Tactile cues for sequencing;Tactile cues for weight shifting;Tactile cues for posture;Tactile cues for placement;Verbal cues for sequencing;Verbal cues for technique;Verbal cues for precautions/safety   Sit to Stand Details (indicate cue type and reason) Pt progressed from mod A to min A, during w/c<>standing txfs. Pt required cues on safety, sequencing, and improved ant. weight shifting. Pt using OP neuro w/c today, without cushion.   Stand to Sit 4: Min assist;4: Min guard   Stand to Sit Details (indicate cue type and reason) Tactile cues for sequencing;Tactile cues for initiation;Tactile cues  for weight shifting;Tactile cues for posture;Verbal cues for sequencing;Verbal cues for technique;Verbal cues for precautions/safety;Verbal cues for safe use of DME/AE   Stand to Sit Details Pt progressed to min guard and improved eccentric control. Pt using OP neuro w/c today, as he left his at home.      Ambulation/Gait   Ambulation/Gait Yes   Ambulation/Gait Assistance 4: Min assist;3: Mod assist   Ambulation/Gait Assistance Details Cues for safety, keep RW on floor, stay within RW, improve stride length, heel strike and upright posture. Pt required mostly min A with intermittent mod A during LOB in posterior and lateral directions.     Ambulation Distance (Feet) 27 Feet  15, 40'    Assistive device Rolling walker   Gait Pattern Step-to pattern;Decreased stance time - left;Decreased step length - right;Decreased stride length;Decreased hip/knee flexion - left;Decreased dorsiflexion - left;Decreased weight shift to left;Left flexed knee in stance;Trunk flexed   Ambulation Surface Level;Indoor   Gait velocity 0.42f/sec.     Standardized Balance Assessment   Standardized Balance Assessment Timed Up and Go Test     Timed Up and Go Test   TUG Normal TUG   Normal TUG (seconds) 154.3  with RW                PT Education - 04/18/17 1544    Education provided Yes   Education Details PT discussed goals and outcome measure results. PT discussed d/c next session, after reviewing HEP (LTG 1).   Person(s) Educated Patient;Spouse   Methods Explanation   Comprehension Verbalized understanding          PT Short Term Goals - 04/06/17 1612      PT SHORT TERM GOAL #1   Title STGs same as LTGs           PT Long Term Goals - 04/18/17 1547      PT LONG TERM GOAL #1   Title Pt will effectively perform HEP with assist of wife to maximize functional gains made in PT.  TARGET DATE FOR ALL LTGS: 04/10/17 (New target: 7/27 due to missed week 7/2-7/6)   Time 4   Period Weeks   Status On-going     PT LONG TERM GOAL #2   Title Pt will perform STS txf to/from w/c with RW and S to improve safety during functional mobility.    Time 4   Period Weeks   Status Not Met     PT LONG TERM GOAL #3   Title Pt will perform TUG time in </=100 seconds with RW in order to improve safety during functional mobility and decr. falls risk.    Time 4   Period Weeks   Status Not Met     PT LONG TERM GOAL #4   Title Perform BERG and write goal as appropriate.   Baseline 6/25 5/56; no goal set as pt reliant on RW for UE support and do not anticipate a minimum clinically important difference within 4 weeks.   Time 4   Period Weeks    Status Achieved     PT LONG TERM GOAL #5   Title Pt will improve gait speed to >/=1.034fsec. with RW to improve functional mobility during househould mobility.    Time 4   Period Weeks   Status Partially Met     PT LONG TERM GOAL #6   Title Pt will amb. 150' with RW, with close S of wife, in order to safely amb. at home.   Time  4   Period Weeks   Status Not Met               Plan - 04/18/17 1545    Clinical Impression Statement Pt partially met LTG 5, pt did not meet LTGs 2, 3, and 6. Pt with noted incr. LOB and difficulty following commands today. Pt's wife reported MD changed pt's medication yesterday, and appt. today is during pt's normal nap time. This could explain pt's incr. difficulty performing tasks today. PT will assess LTG (HEP) 1 next session and d/c pt, as pt has achieved maximal functional gains during PT at this time.    Rehab Potential Fair   Clinical Impairments Affecting Rehab Potential see above   PT Frequency 2x / week   PT Duration 2 weeks  original POC: 2x/week for 4 weeks   PT Treatment/Interventions ADLs/Self Care Home Management;Biofeedback;Electrical Stimulation;Cognitive remediation;Neuromuscular re-education;Balance training;Therapeutic exercise;Manual techniques;Therapeutic activities;Functional mobility training;Stair training;Gait training;Orthotic Fit/Training;DME Instruction;Patient/family education;Vestibular   PT Next Visit Plan Assess LTG 1 (HEP) and d/c.    Consulted and Agree with Plan of Care Family member/caregiver;Patient   Family Member Consulted wife: [name]      Patient will benefit from skilled therapeutic intervention in order to improve the following deficits and impairments:  Abnormal gait, Decreased endurance, Impaired sensation, Impaired tone, Decreased knowledge of use of DME, Decreased strength, Impaired UE functional use, Decreased balance, Decreased mobility, Decreased range of motion, Decreased coordination, Impaired  flexibility, Postural dysfunction  Visit Diagnosis: Other abnormalities of gait and mobility  Unsteadiness on feet  Muscle weakness (generalized)     Problem List Patient Active Problem List   Diagnosis Date Noted  . Status cardiac pacemaker 01/29/2017  . Unintended weight loss 01/29/2017  . Tinea pedis of right foot 01/29/2017  . Hypercalcemia 01/15/2017  . Heart block 01/15/2017  . Constipation 09/12/2016  . Glaucoma 09/12/2016  . Liver hemangioma 08/14/2016  . Renal cyst 08/14/2016  . Renal mass, right 08/14/2016  . Elevated liver enzymes 08/10/2016  . CKD (chronic kidney disease) stage 3, GFR 30-59 ml/min 08/16/2015  . At high risk for falls 08/16/2015  . Subcutaneous nodules 08/16/2015  . Prediabetes 10/07/2013  . Inguinal hernia 03/25/2013  . History of CVA with residual deficit 03/25/2013  . HTN (hypertension) 10/09/2012  . Tobacco abuse 10/09/2012    Tvisha Schwoerer L 04/18/2017, 3:48 PM  [facility] [address] [place], [name], [address] Phone: [phone]   Fax:  [phone]  Name: Darrell [name] MRN: [id number] Date of Birth: 1950/10/15  Geoffry Paradise, PT,DPT 04/18/17 3:48 PM Phone: [phone] Fax: [phone]

## 2017-04-19 ENCOUNTER — Ambulatory Visit: Payer: Medicare PPO | Admitting: Physical Therapy

## 2017-04-20 ENCOUNTER — Encounter: Payer: Self-pay | Admitting: Physical Therapy

## 2017-04-20 ENCOUNTER — Ambulatory Visit: Payer: Medicare PPO | Admitting: Physical Therapy

## 2017-04-20 DIAGNOSIS — R2689 Other abnormalities of gait and mobility: Secondary | ICD-10-CM

## 2017-04-20 DIAGNOSIS — M6281 Muscle weakness (generalized): Secondary | ICD-10-CM

## 2017-04-20 DIAGNOSIS — R2681 Unsteadiness on feet: Secondary | ICD-10-CM

## 2017-04-20 NOTE — Therapy (Signed)
[facility] [address] [place], [name], [address] Phone: [phone]   Fax:  [phone]  Physical Therapy Treatment and Discharge Summary  Patient Details  Name: Darrell [name] MRN: [id number] Date of Birth: April 04, 1951 Referring Provider: Dr. Adrian Blackwater  Encounter Date: 04/20/2017      PT End of Session - 04/20/17 1115    Visit Number 8   Number of Visits 9   Date for PT Re-Evaluation 04/12/17   Authorization Type [employer] (G-CODE AND PN every 10th visit to be sure).   PT Start Time 1023   PT Stop Time 1100   PT Time Calculation (min) 37 min   Equipment Utilized During Treatment Gait belt   Activity Tolerance Patient tolerated treatment well   Behavior During Therapy WFL for tasks assessed/performed      Past Medical History:  Diagnosis Date  . Chronic kidney disease    stage 3 GFR 30-59 ml/min   . Dysphagia as late effect of cerebrovascular disease    pts wife states pt has to eat soft foods   . GERD (gastroesophageal reflux disease)   . Glaucoma   . High cholesterol   . Hypertension   . Neuromuscular disorder ([place])    chronic inflammatory demyelinating polyneuropathy   . Stroke [facility])    2011 with residual deficit left sided weakness  . Tobacco dependence     Past Surgical History:  Procedure Laterality Date  . EYE SURGERY    . INGUINAL HERNIA REPAIR Right 11/23/2014   Procedure: right inguinal hernia repair with mesh;  Surgeon: Armandina Gemma, MD;  Location: [facility];  Service: General;  Laterality: Right;  . INSERTION OF MESH N/A 11/23/2014   Procedure: INSERTION OF MESH;  Surgeon: Armandina Gemma, MD;  Location: [facility];  Service: General;  Laterality: N/A;  . PACEMAKER IMPLANT N/A 01/16/2017   Procedure: Pacemaker Implant;  Surgeon: Will Meredith Leeds, MD;  Location: [place] [facility];  Service: Cardiovascular;  Laterality: N/A;  . SHOULDER SURGERY Bilateral 1988, 1998    There were no vitals filed for  this visit.      Subjective Assessment - 04/20/17 1108    Subjective Wife and patient report they do some exercises each day, but never all at once.    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    Currently in Pain? No/denies                         [facility] Adult PT Treatment/Exercise - 04/20/17 0001      Transfers   Transfers Sit to Stand;Stand to Sit;Stand Pivot Transfers;Squat Pivot Transfers   Sit to Stand 4: Min assist   Sit to Stand Details (indicate cue type and reason) wife provided assist; with RW   Stand to Sit 4: Min guard;4: Min assist   Stand to Sit Details wife cued to help his shoulders stay forward to prevent "dropping" into w/c from posterior lean     Ambulation/Gait   Gait Comments Wife reports they are practicing walking with RW in their hallway. Educated that would not advise this as his ability continues to vary and do not want either of him to get hurt if he fell (he is very  tall and she is very Research officer, political party)     Actuary 5: Water engineer Both lower extermities   Distance 80   Comments cued for longer steps, heel to toe progression as he "walks" w/c      Exercises   Exercises --   Other Exercises  see pt instructions for HEP. Wife instructed pt through each exercise from handout. Required re-explanation of sit to stand exercise (# reps, standing tall prior to sit, how to control descent). Wife able to correctly provide cues and assist after educaiton                PT Education - 04/20/17 1114    Education provided Yes   Education Details Went through entire HEP with wife directing patient; importance of continued exercises (especially stretches!)   Person(s) Educated Patient;Spouse   Methods  Explanation;Demonstration;Handout   Comprehension Verbalized understanding;Returned demonstration          PT Short Term Goals - 04/06/17 1612      PT SHORT TERM GOAL #1   Title STGs same as LTGs           PT Long Term Goals - 04/20/17 1115      PT LONG TERM GOAL #1   Title Pt will effectively perform HEP with assist of wife to maximize functional gains made in PT.  TARGET DATE FOR ALL LTGS: 04/10/17 (New target: 7/27 due to missed week 7/2-7/6)   Time 4   Period Weeks   Status Achieved     PT LONG TERM GOAL #2   Title Pt will perform STS txf to/from w/c with RW and S to improve safety during functional mobility.    Time 4   Period Weeks   Status Not Met     PT LONG TERM GOAL #3   Title Pt will perform TUG time in </=100 seconds with RW in order to improve safety during functional mobility and decr. falls risk.    Time 4   Period Weeks   Status Not Met     PT LONG TERM GOAL #4   Title Perform BERG and write goal as appropriate.   Baseline 6/25 5/56; no goal set as pt reliant on RW for UE support and do not anticipate a minimum clinically important difference within 4 weeks.   Time 4   Period Weeks   Status Achieved     PT LONG TERM GOAL #5   Title Pt will improve gait speed to >/=1.81f/sec. with RW to improve functional mobility during househould mobility.    Time 4   Period Weeks   Status Partially Met     PT LONG TERM GOAL #6   Title Pt will amb. 150' with RW, with close S of wife, in order to safely amb. at home.   Time 4   Period Weeks   Status Not Met               Plan - 04/20/17 1116    Clinical Impression Statement Final LTG (#1) assessed today with pt/wife demonstrating HEP. Cues required to improve technique with sit to stand, however wife able to return demonstrate after 3 reps (and completed 3 more). Educated patient and wife on importance of continuing activity/exercises to prevent further decline in his skills, ROM, and strength. Wife  verbalized understanding. Discharge patient due to overall lack of progress due to chronic CVA.    Rehab Potential Fair   Consulted  and Agree with Plan of Care Family member/caregiver;Patient   Family Member Consulted wife: Darrell [name]      Patient will benefit from skilled therapeutic intervention in order to improve the following deficits and impairments:     Visit Diagnosis: Other abnormalities of gait and mobility  Unsteadiness on feet  Muscle weakness (generalized)       G-Codes - 05-06-17 1122    Functional Assessment Tool Used (Outpatient Only) TUG with RW: 154.3; gait speed with RW: 0.29f/sec.   Functional Limitation Mobility: Walking and moving around   Mobility: Walking and Moving Around Goal Status [phone] At least 20 percent but less than 40 percent impaired, limited or restricted   Mobility: Walking and Moving Around Discharge Status [phone] At least 40 percent but less than 60 percent impaired, limited or restricted      Problem List Patient Active Problem List   Diagnosis Date Noted  . Status cardiac pacemaker 01/29/2017  . Unintended weight loss 01/29/2017  . Tinea pedis of right foot 01/29/2017  . Hypercalcemia 01/15/2017  . Heart block 01/15/2017  . Constipation 09/12/2016  . Glaucoma 09/12/2016  . Liver hemangioma 08/14/2016  . Renal cyst 08/14/2016  . Renal mass, right 08/14/2016  . Elevated liver enzymes 08/10/2016  . CKD (chronic kidney disease) stage 3, GFR 30-59 ml/min 08/16/2015  . At high risk for falls 08/16/2015  . Subcutaneous nodules 08/16/2015  . Prediabetes 10/07/2013  . Inguinal hernia 03/25/2013  . History of CVA with residual deficit 03/25/2013  . HTN (hypertension) 10/09/2012  . Tobacco abuse 10/09/2012   PHYSICAL THERAPY DISCHARGE SUMMARY  Visits from Start of Care: 8  Current functional level related to goals / functional outcomes: Gait velocity improved 0.25 to 0.41 ft/sec, however overall level of assist needed did not  progress   Remaining deficits: decr attention, left inattention, weakness, poor balance   Education / Equipment: HEP  Plan: Patient agrees to discharge.  Patient goals were partially met. Patient is being discharged due to lack of progress.  ?????        LRexanne Mano, PT 7Aug 12, 2018 11:24 AM  [facility] StreetSOxfordGAlg[place] [address]Phone: [phone]  Fax:  [phone] Name: LHAIDER HORNADAYMRN: [id number] of Birth: [birth year]

## 2017-04-20 NOTE — Patient Instructions (Signed)
Knee to Chest For hip flexor stretch    Lying supine, bend one knee to your chest and hold with your hands. The other leg stays flat on the bed. Hold 30 seconds.  Repeat with other leg. Do 2 on each leg.  Do _2-3__ times per day.  Copyright  VHI. All rights reserved.   Patient Instructions Encounter Date: 04/03/2017 Elza Rafter, PT  Physical Therapy    Hamstring Strength: Forward Stool Walk    Using both legs, "walk" forward down a long hall. Be sure to reach forward with your heel, pull forward with that leg until your foot is flat, and THEN reach the other heel forward and pull until your foot is flat. "heel-toe walking" Repeat __2-3__ times down hallway. Do __2__ sessions per day.  http://cc.exer.us/48  Copyright  VHI. All rights reserved.  Functional Quadriceps: Sit to Stand    Sit on edge of chair, feet flat on floor. Stand upright, extending knees fully. Repeat __10__ times per set. Do __1__ sets per session. Do __2__ sessions per day.  http://orth.exer.us/734   Copyright  VHI. All rights reserved.    HIP: Hamstrings - Short Sitting    Rest leg on raised surface. Keep knee straight. Lift chest and lean forward while still maintaining upright posture and keeping knee straight. Hold __30_ seconds. Switch and perform with other leg. Perform _3__ reps per set, __2-3_ sets per day, _7__ days per week  Copyright  VHI. All rights reserved.   Piriformis Stretch, Sitting    Sit, one ankle on opposite knee, same-side hand on crossed knee. Push down on knee, keeping spine straight. Lean torso forward, with flat back, until tension is felt in hamstrings and gluteals of crossed-leg side. Hold __30_ seconds.  Repeat with other leg. Repeat _3__ times per session. Do _2-3__ sessions per day. 7 days a week.  Copyright  VHI. All rights reserved.       Hamstring Strength: Forward Stool Walk    Using both legs, "walk" forward down a long hall. Be  sure to reach forward with your heel, pull forward with that leg until your foot is flat, and THEN reach the other heel forward and pull until your foot is flat. "heel-toe walking" Repeat __2-3__ times down hallway.  Do __2__ sessions per day.

## 2017-04-23 ENCOUNTER — Ambulatory Visit: Payer: Medicare PPO | Admitting: Family Medicine

## 2017-04-25 ENCOUNTER — Other Ambulatory Visit: Payer: Self-pay | Admitting: Family Medicine

## 2017-04-25 DIAGNOSIS — I10 Essential (primary) hypertension: Secondary | ICD-10-CM

## 2017-04-25 MED FILL — BRIMONIDINE 0.2% EYE DROP: 0.2 | 50 days supply | Qty: 10 | Fill #0

## 2017-04-25 MED FILL — AMLODIPINE BESYLATE 10 MG T: 10 | 30 days supply | Qty: 30 | Fill #6

## 2017-04-25 MED FILL — METOPROLOL TARTRATE 25 MG T: 25 | 30 days supply | Qty: 60 | Fill #0

## 2017-04-25 MED FILL — ATORVASTATIN 40 MG TABLET: 40 | 30 days supply | Qty: 30 | Fill #6

## 2017-04-25 MED FILL — DORZOLAMIDE-TIMOLOL EYE DRP: 22.3-6.8 | 50 days supply | Qty: 10 | Fill #1

## 2017-04-25 MED FILL — LATANOPROST 0.005% EYE DRP: 0.005 | 18 days supply | Qty: 3 | Fill #0

## 2017-04-25 MED FILL — TORSEMIDE 20 MG TABLET: 20 | 30 days supply | Qty: 30 | Fill #2

## 2017-04-25 MED FILL — hydrALAZINE HCL 50 MG TABS: 50 | 30 days supply | Qty: 90 | Fill #4

## 2017-04-25 MED FILL — LISINOPRIL 20 MG TAB: 20 | 30 days supply | Qty: 30 | Fill #1

## 2017-04-27 ENCOUNTER — Encounter: Payer: Self-pay | Admitting: Family Medicine

## 2017-04-27 ENCOUNTER — Ambulatory Visit: Payer: Medicare PPO | Attending: Family Medicine | Admitting: Family Medicine

## 2017-04-27 VITALS — BP 147/96 | HR 86 | Temp 97.9°F | Ht 76.0 in

## 2017-04-27 DIAGNOSIS — Z9889 Other specified postprocedural states: Secondary | ICD-10-CM | POA: Insufficient documentation

## 2017-04-27 DIAGNOSIS — N183 Chronic kidney disease, stage 3 (moderate): Secondary | ICD-10-CM | POA: Diagnosis not present

## 2017-04-27 DIAGNOSIS — Z7982 Long term (current) use of aspirin: Secondary | ICD-10-CM | POA: Diagnosis not present

## 2017-04-27 DIAGNOSIS — Z720 Tobacco use: Secondary | ICD-10-CM | POA: Diagnosis not present

## 2017-04-27 DIAGNOSIS — I129 Hypertensive chronic kidney disease with stage 1 through stage 4 chronic kidney disease, or unspecified chronic kidney disease: Secondary | ICD-10-CM | POA: Insufficient documentation

## 2017-04-27 DIAGNOSIS — I10 Essential (primary) hypertension: Secondary | ICD-10-CM | POA: Diagnosis not present

## 2017-04-27 DIAGNOSIS — F17210 Nicotine dependence, cigarettes, uncomplicated: Secondary | ICD-10-CM | POA: Diagnosis present

## 2017-04-27 DIAGNOSIS — Z950 Presence of cardiac pacemaker: Secondary | ICD-10-CM | POA: Insufficient documentation

## 2017-04-27 DIAGNOSIS — Z8673 Personal history of transient ischemic attack (TIA), and cerebral infarction without residual deficits: Secondary | ICD-10-CM | POA: Insufficient documentation

## 2017-04-27 NOTE — Progress Notes (Signed)
Pt has been out of lisinopril for a couple of days. Pt did not wan to weigh today.

## 2017-04-27 NOTE — Progress Notes (Signed)
Subjective:  Patient ID: Darrell [name], male    DOB: 05/21/1951  Age: 66 y.o. MRN: [id number]  CC: Nicotine Dependence   HPI Darrell [name] has hx of CVA (09/2012), HTN, CKD stage 3, heart block s/p pacemaker placement he presents with his wife for    1.    HTN: compliant with regimen. Just out of linisopril 10 mg for the past 3 days. Has cough that is intermittently dry and productive. Denies chest pain, shortness of breath and leg swelling.   2. Smoking: 20-25 cigs per day for the past 40 years. He smokes after he eats breakfast. Has cough. Desire to quit. Could not afford chantix. Has used nicotine patches in the past. Interested in trying lozenges.   Past Surgical History:  Procedure Laterality Date  . EYE SURGERY    . INGUINAL HERNIA REPAIR Right 11/23/2014   Procedure: right inguinal hernia repair with mesh;  Surgeon: Armandina Gemma, MD;  Location: [facility];  Service: General;  Laterality: Right;  . INSERTION OF MESH N/A 11/23/2014   Procedure: INSERTION OF MESH;  Surgeon: Armandina Gemma, MD;  Location: [facility];  Service: General;  Laterality: N/A;  . PACEMAKER IMPLANT N/A 01/16/2017   Procedure: Pacemaker Implant;  Surgeon: Will Meredith Leeds, MD;  Location: [facility];  Service: Cardiovascular;  Laterality: N/A;  . SHOULDER SURGERY Bilateral 1988, 1998    Social History  Substance Use Topics  . Smoking status: Current Every Day Smoker    Packs/day: 1.00    Years: 40.00    Types: Cigarettes  . Smokeless tobacco: Never Used  . Alcohol use No     Comment: alcohol free for 1 year was drinking 1/2 pint per day    Outpatient Medications Prior to Visit  Medication Sig Dispense Refill  . amLODipine (NORVASC) 10 MG tablet Take 1 tablet (10 mg total) by mouth every morning. 90 tablet 2  . aspirin EC 81 MG tablet Take 1 tablet (81 mg total) by mouth daily. 90 tablet 3  . atorvastatin (LIPITOR) 40 MG tablet Take 1 tablet (40 mg total) by mouth daily. 90 tablet 3  . brimonidine  (ALPHAGAN P) 0.1 % SOLN Place 1 drop into both eyes 2 (two) times daily.    . dorzolamide-timolol (COSOPT) 22.3-6.8 MG/ML ophthalmic solution Place 1 drop into both eyes 2 (two) times daily.    . fluticasone (FLONASE) 50 MCG/ACT nasal spray Place 2 sprays into both nostrils daily. 16 g 6  . hydrALAZINE (APRESOLINE) 50 MG tablet Take 1 tablet (50 mg total) by mouth 3 (three) times daily. 270 tablet 3  . latanoprost (XALATAN) 0.005 % ophthalmic solution Place 1 drop into both eyes at bedtime.    Marland Kitchen lisinopril (PRINIVIL,ZESTRIL) 20 MG tablet Take 1 tablet (20 mg total) by mouth daily. 90 tablet 3  . metoprolol tartrate (LOPRESSOR) 25 MG tablet Take 1 tablet (25 mg total) by mouth 2 (two) times daily. 180 tablet 3  . Multiple Vitamins-Iron (DAILY VITAMIN FORMULA+IRON) TABS Take 1 tablet by mouth daily.    . Psyllium (EQ DAILY FIBER PO) Take 1 tablet by mouth daily.    Marland Kitchen terbinafine (LAMISIL AT) 1 % cream Apply 1 application topically 2 (two) times daily. To R foot 30 g 0  . torsemide (DEMADEX) 20 MG tablet Take 20 mg by mouth daily.    . varenicline (CHANTIX CONTINUING MONTH PAK) 1 MG tablet Take 1 tablet (1 mg total) by mouth 2 (two) times daily. For  PASS 60 tablet 2  . varenicline (CHANTIX STARTING MONTH PAK) 0.5 MG X 11 & 1 MG X 42 tablet Take by mouth, Taper per packet insert 53 tablet 0   No facility-administered medications prior to visit.     ROS Review of Systems  Constitutional: Positive for appetite change and unexpected weight change. Negative for chills, fatigue and fever.  HENT: Negative for congestion, rhinorrhea, sore throat, trouble swallowing and voice change.   Eyes: Positive for visual disturbance (blind in R eye ).  Respiratory: Negative for cough and shortness of breath.   Cardiovascular: Negative for chest pain, palpitations and leg swelling.  Gastrointestinal: Negative for abdominal pain, blood in stool, constipation, diarrhea, nausea and vomiting.  Endocrine: Negative for  polydipsia, polyphagia and polyuria.  Musculoskeletal: Positive for arthralgias. Negative for back pain, gait problem, joint swelling, myalgias and neck pain.  Skin: Negative for rash.  Allergic/Immunologic: Negative for immunocompromised state.  Neurological: Positive for speech difficulty and weakness (in lower extermities, L >R ). Negative for light-headedness and headaches.  Hematological: Negative for adenopathy. Does not bruise/bleed easily.  Psychiatric/Behavioral: Negative for dysphoric mood, sleep disturbance and suicidal ideas. The patient is not nervous/anxious.     Objective:  BP (!) 147/96   Pulse 86   Temp 97.9 F (36.6 C) (Oral)   Ht 6\' 4"  (1.93 m)   SpO2 99%   BP/Weight 04/27/2017 04/17/2017 2/54/2706  Systolic BP 237 628 315  Diastolic BP 96 80 66  Wt. (Lbs) - 180 -  BMI - 21.91 -   Wt Readings from Last 3 Encounters:  04/17/17 180 lb (81.6 kg)  02/20/17 175 lb 9.6 oz (79.7 kg)  01/29/17 166 lb 12.8 oz (75.7 kg)    Physical Exam  Constitutional: He appears well-developed and well-nourished. No distress.  Thin adult male  Sitting in wheelchair   HENT:  Head: Normocephalic and atraumatic.  Eyes: Conjunctivae and EOM are normal. Right pupil is not reactive. Right pupil is round. Left pupil is round and reactive.  Neck: Normal range of motion. Neck supple.    Cardiovascular: Normal rate, regular rhythm, normal heart sounds and intact distal pulses.   Pulmonary/Chest: Effort normal and breath sounds normal.  Musculoskeletal: He exhibits no edema.  Neurological: He is alert. No cranial nerve deficit. Gait abnormal.  Decreased strength in legs   Skin: Skin is warm and dry. No rash noted. No erythema.  Psychiatric: He has a normal mood and affect.     Chemistry      Component Value Date/Time   NA 138 02/09/2017 1117   K 4.3 02/09/2017 1117   CL 98 02/09/2017 1117   CO2 24 02/09/2017 1117   BUN 28 (H) 02/09/2017 1117   CREATININE 2.11 (H) 02/09/2017 1117    CREATININE 1.45 (H) 08/10/2016 1647      Component Value Date/Time   CALCIUM 10.5 (H) 02/09/2017 1117   ALKPHOS 80 01/15/2017 2348   AST 12 (L) 01/15/2017 2348   ALT 9 (L) 01/15/2017 2348   BILITOT 0.7 01/15/2017 2348        Assessment & Plan:  Khaleef was seen today for nicotine dependence.  Diagnoses and all orders for this visit:  Tobacco abuse  Essential hypertension   There are no diagnoses linked to this encounter.  No orders of the defined types were placed in this encounter.   Follow-up: Return in about 8 weeks (around 06/22/2017) for smoking cessation.   Boykin Nearing MD

## 2017-04-27 NOTE — Patient Instructions (Signed)
Darrell [name] was seen today for nicotine dependence.  Diagnoses and all orders for this visit:  Tobacco abuse  Essential hypertension    Start with the 21 mg nicotine patch for 6 weeks, then 14 mg patch for 2 weeks, then 7 mg patch for 2 weeks. If you have trouble sleeping or vivid dreams with the patch remove it at bedtime and replace in the morning.  It does take 30 minutes of wearing the patch to reach peak levels.   It is also helpful and recommended to use a short acting nicotine replacement like the gum or lozenge.  I recommend the 2 mg patch  This is best done by taking the lozenge once every hour while awake, more if needed over the first 6 weeks. Gradually reduce the number of lozenges over the second six weeks.   I called the [facility] on [address], they have already closed.   F/u in 8 weeks for smoking cessation and flu shot   Dr. Adrian Blackwater

## 2017-04-27 NOTE — Assessment & Plan Note (Signed)
A: heavy smoker. Desires to quit. Unable to afford chantix P: Nicotine patch and lozenges recommneded  Start with the 21 mg nicotine patch for 6 weeks, then 14 mg patch for 2 weeks, then 7 mg patch for 2 weeks. If you have trouble sleeping or vivid dreams with the patch remove it at bedtime and replace in the morning.  It does take 30 minutes of wearing the patch to reach peak levels.   It is also helpful and recommended to use a short acting nicotine replacement like the gum or lozenge.  I recommend the 2 mg lozenge  This is best done by taking the lozenge once every hour while awake, more if needed over the first 6 weeks. Gradually reduce the number of lozenges over the second six weeks.

## 2017-05-02 ENCOUNTER — Other Ambulatory Visit: Payer: Self-pay | Admitting: Cardiology

## 2017-05-02 MED ORDER — METOPROLOL TARTRATE 25 MG PO TABS: 25.0000 mg | ORAL_TABLET | Freq: Two times a day (BID) | ORAL | 3 refills | Status: DC

## 2017-05-02 NOTE — Telephone Encounter (Signed)
Pt's medication was resent to a mail order pharmacy. Confirmation received.

## 2017-05-03 ENCOUNTER — Other Ambulatory Visit: Payer: Self-pay | Admitting: Pharmacist

## 2017-05-03 DIAGNOSIS — I6930 Unspecified sequelae of cerebral infarction: Secondary | ICD-10-CM

## 2017-05-03 DIAGNOSIS — I10 Essential (primary) hypertension: Secondary | ICD-10-CM

## 2017-05-03 MED ORDER — LISINOPRIL 20 MG PO TABS: 20.0000 mg | ORAL_TABLET | Freq: Every day | ORAL | 0 refills | Status: DC

## 2017-05-03 MED ORDER — ATORVASTATIN CALCIUM 40 MG PO TABS: 40.0000 mg | ORAL_TABLET | Freq: Every day | ORAL | 0 refills | Status: DC

## 2017-05-03 MED ORDER — HYDRALAZINE HCL 50 MG PO TABS: 50.0000 mg | ORAL_TABLET | Freq: Three times a day (TID) | ORAL | 0 refills | Status: DC

## 2017-05-03 MED ORDER — AMLODIPINE BESYLATE 10 MG PO TABS
10.0000 mg | ORAL_TABLET | Freq: Every morning | ORAL | 0 refills | Status: DC
Start: 2017-05-03 — End: 2017-07-17

## 2017-05-31 ENCOUNTER — Other Ambulatory Visit: Payer: Self-pay | Admitting: Surgery

## 2017-06-12 ENCOUNTER — Ambulatory Visit: Payer: Medicare PPO | Admitting: Family Medicine

## 2017-06-14 ENCOUNTER — Telehealth: Payer: Self-pay | Admitting: *Deleted

## 2017-06-14 NOTE — Telephone Encounter (Signed)
Faxed cardiac clearance to [facility], Dr. Coralie Keens. Attn: April Staton, CMA.  Clearance request for neck mass removal surgery Clearance states: Intermediate risk for an intermediate risk procedure. Continue ASA, Lipitor to decrease cardiac events. Allegra Lai, M.D.

## 2017-07-07 ENCOUNTER — Emergency Department (HOSPITAL_COMMUNITY): Payer: Medicare PPO

## 2017-07-07 ENCOUNTER — Encounter (HOSPITAL_COMMUNITY): Payer: Self-pay | Admitting: *Deleted

## 2017-07-07 ENCOUNTER — Inpatient Hospital Stay (HOSPITAL_COMMUNITY)
Admission: EM | Admit: 2017-07-07 | Discharge: 2017-07-12 | DRG: 101 | Disposition: A | Discharge status: Skilled Nursing Facility | Payer: Medicare PPO | Attending: Internal Medicine | Admitting: Internal Medicine

## 2017-07-07 DIAGNOSIS — F17210 Nicotine dependence, cigarettes, uncomplicated: Secondary | ICD-10-CM | POA: Diagnosis present

## 2017-07-07 DIAGNOSIS — R402142 Coma scale, eyes open, spontaneous, at arrival to emergency department: Secondary | ICD-10-CM | POA: Diagnosis present

## 2017-07-07 DIAGNOSIS — Z7951 Long term (current) use of inhaled steroids: Secondary | ICD-10-CM

## 2017-07-07 DIAGNOSIS — I69354 Hemiplegia and hemiparesis following cerebral infarction affecting left non-dominant side: Secondary | ICD-10-CM

## 2017-07-07 DIAGNOSIS — Z888 Allergy status to other drugs, medicaments and biological substances status: Secondary | ICD-10-CM

## 2017-07-07 DIAGNOSIS — N183 Chronic kidney disease, stage 3 unspecified: Secondary | ICD-10-CM | POA: Diagnosis present

## 2017-07-07 DIAGNOSIS — G6181 Chronic inflammatory demyelinating polyneuritis: Secondary | ICD-10-CM

## 2017-07-07 DIAGNOSIS — Z833 Family history of diabetes mellitus: Secondary | ICD-10-CM

## 2017-07-07 DIAGNOSIS — I6930 Unspecified sequelae of cerebral infarction: Secondary | ICD-10-CM

## 2017-07-07 DIAGNOSIS — R001 Bradycardia, unspecified: Secondary | ICD-10-CM

## 2017-07-07 DIAGNOSIS — R402362 Coma scale, best motor response, obeys commands, at arrival to emergency department: Secondary | ICD-10-CM | POA: Diagnosis present

## 2017-07-07 DIAGNOSIS — Z950 Presence of cardiac pacemaker: Secondary | ICD-10-CM | POA: Diagnosis present

## 2017-07-07 DIAGNOSIS — E7800 Pure hypercholesterolemia, unspecified: Secondary | ICD-10-CM | POA: Diagnosis present

## 2017-07-07 DIAGNOSIS — Z720 Tobacco use: Secondary | ICD-10-CM | POA: Diagnosis present

## 2017-07-07 DIAGNOSIS — R569 Unspecified convulsions: Principal | ICD-10-CM | POA: Diagnosis present

## 2017-07-07 DIAGNOSIS — I10 Essential (primary) hypertension: Secondary | ICD-10-CM | POA: Diagnosis present

## 2017-07-07 DIAGNOSIS — H547 Unspecified visual loss: Secondary | ICD-10-CM | POA: Diagnosis present

## 2017-07-07 DIAGNOSIS — N1832 Chronic kidney disease, stage 3b: Secondary | ICD-10-CM | POA: Diagnosis present

## 2017-07-07 DIAGNOSIS — Z883 Allergy status to other anti-infective agents status: Secondary | ICD-10-CM

## 2017-07-07 DIAGNOSIS — K219 Gastro-esophageal reflux disease without esophagitis: Secondary | ICD-10-CM | POA: Diagnosis present

## 2017-07-07 DIAGNOSIS — I129 Hypertensive chronic kidney disease with stage 1 through stage 4 chronic kidney disease, or unspecified chronic kidney disease: Secondary | ICD-10-CM | POA: Diagnosis present

## 2017-07-07 DIAGNOSIS — Z91013 Allergy to seafood: Secondary | ICD-10-CM

## 2017-07-07 DIAGNOSIS — H409 Unspecified glaucoma: Secondary | ICD-10-CM | POA: Diagnosis present

## 2017-07-07 DIAGNOSIS — Z7982 Long term (current) use of aspirin: Secondary | ICD-10-CM

## 2017-07-07 DIAGNOSIS — R402252 Coma scale, best verbal response, oriented, at arrival to emergency department: Secondary | ICD-10-CM | POA: Diagnosis present

## 2017-07-07 DIAGNOSIS — G40909 Epilepsy, unspecified, not intractable, without status epilepticus: Secondary | ICD-10-CM

## 2017-07-07 DIAGNOSIS — Z9181 History of falling: Secondary | ICD-10-CM

## 2017-07-07 DIAGNOSIS — N184 Chronic kidney disease, stage 4 (severe): Secondary | ICD-10-CM | POA: Diagnosis present

## 2017-07-07 DIAGNOSIS — Z8249 Family history of ischemic heart disease and other diseases of the circulatory system: Secondary | ICD-10-CM

## 2017-07-07 DIAGNOSIS — R1312 Dysphagia, oropharyngeal phase: Secondary | ICD-10-CM | POA: Diagnosis present

## 2017-07-07 LAB — CBC WITH DIFFERENTIAL/PLATELET
Basophils Absolute: 0 10*3/uL (ref 0.0–0.1)
Basophils Relative: 0 %
Eosinophils Absolute: 0.5 10*3/uL (ref 0.0–0.7)
Eosinophils Relative: 5 %
HCT: 38.5 % — ABNORMAL LOW (ref 39.0–52.0)
Hemoglobin: 12.2 g/dL — ABNORMAL LOW (ref 13.0–17.0)
Lymphocytes Relative: 35 %
Lymphs Abs: 3.4 10*3/uL (ref 0.7–4.0)
MCH: 28.6 pg (ref 26.0–34.0)
MCHC: 31.7 g/dL (ref 30.0–36.0)
MCV: 90.2 fL (ref 78.0–100.0)
Monocytes Absolute: 0.9 10*3/uL (ref 0.1–1.0)
Monocytes Relative: 9 %
Neutro Abs: 5.1 10*3/uL (ref 1.7–7.7)
Neutrophils Relative %: 51 %
Platelets: 275 10*3/uL (ref 150–400)
RBC: 4.27 MIL/uL (ref 4.22–5.81)
RDW: 14.1 % (ref 11.5–15.5)
WBC: 9.8 10*3/uL (ref 4.0–10.5)

## 2017-07-07 LAB — COMPREHENSIVE METABOLIC PANEL
ALT: 6 U/L — ABNORMAL LOW (ref 17–63)
AST: 18 U/L (ref 15–41)
Albumin: 3.4 g/dL — ABNORMAL LOW (ref 3.5–5.0)
Alkaline Phosphatase: 95 U/L (ref 38–126)
Anion gap: 12 (ref 5–15)
BUN: 17 mg/dL (ref 6–20)
CO2: 20 mmol/L — ABNORMAL LOW (ref 22–32)
Calcium: 9.7 mg/dL (ref 8.9–10.3)
Chloride: 104 mmol/L (ref 101–111)
Creatinine, Ser: 1.85 mg/dL — ABNORMAL HIGH (ref 0.61–1.24)
GFR calc Af Amer: 42 mL/min — ABNORMAL LOW (ref >60)
GFR calc non Af Amer: 37 mL/min — ABNORMAL LOW (ref >60)
Glucose, Bld: 101 mg/dL — ABNORMAL HIGH (ref 65–99)
Potassium: 4.4 mmol/L (ref 3.5–5.1)
Sodium: 136 mmol/L (ref 135–145)
Total Bilirubin: 0.4 mg/dL (ref 0.3–1.2)
Total Protein: 6.5 g/dL (ref 6.5–8.1)

## 2017-07-07 LAB — TROPONIN I: Troponin I: <0.03 ng/mL (ref <0.03)

## 2017-07-07 MED ORDER — MIDAZOLAM HCL 2 MG/2ML IJ SOLN
INTRAMUSCULAR | Status: AC
  Filled 2017-07-07: qty 2

## 2017-07-07 MED ORDER — MIDAZOLAM HCL 2 MG/2ML IJ SOLN: 4.0000 mg | Freq: Once | INTRAMUSCULAR | Status: DC

## 2017-07-07 MED ORDER — DEXTROSE 5 % IV SOLN
1700.0000 mg | Freq: Once | INTRAVENOUS | Status: AC
  Administered 2017-07-07: 1700 mg via INTRAVENOUS
  Filled 2017-07-07: qty 17

## 2017-07-07 NOTE — ED Notes (Signed)
PT MOVING BOTH ARMS  NOT ON COMMAND

## 2017-07-07 NOTE — ED Notes (Signed)
[id number] BY EMS

## 2017-07-07 NOTE — ED Triage Notes (Signed)
THE PT ARRIVED BY [facility] FROM HOME SEIZURE NO HISTORY

## 2017-07-07 NOTE — ED Notes (Signed)
PT TO C-T AND RETURNED  FAMILY AT THE BEDSIDE

## 2017-07-07 NOTE — ED Notes (Signed)
STROKE IN 2012

## 2017-07-07 NOTE — ED Notes (Signed)
PT COUGHING INTERMITTENTLY  HE IS A SMOKER

## 2017-07-07 NOTE — ED Notes (Signed)
The pt remains alert  No complaints

## 2017-07-07 NOTE — ED Notes (Signed)
The pt is alert still sl confused he knows he is at the hospital.  He does not know day of the week  Or month or year  He does know the president

## 2017-07-07 NOTE — ED Provider Notes (Signed)
[facility] DEPT Provider Note   CSN: [id number] Arrival date & time: 07/07/17  2049     History   Chief Complaint Chief Complaint  Patient presents with  . Seizures    HPI Darrell [name] is a 66 y.o. male.  HPI  Patient recently EMS after her seizure, altered mental status. Patient has multiple medical issues including strokes, baseline is awake and alert, interactive, though with slowed speech, weakness on the right side, according to family members. However, he has no history of seizures. Per report the patient was admitted, when he had a witnessed seizure. After a second episode family members called EMS. In route the patient continued to have seizure-like activity, and received Versed, 5 mg per This seemed to slow the seizure activity, though did not cease entirely. The patient himself has a nasal trumpet in place, is actively receiving supplemental oxygen, his not responding to verbal stimuli, level 5 caveat secondary to change in mental status.     Past Medical History:  Diagnosis Date  . Chronic kidney disease    stage 3 GFR 30-59 ml/min   . Dysphagia as late effect of cerebrovascular disease    pts wife states pt has to eat soft foods   . GERD (gastroesophageal reflux disease)   . Glaucoma   . High cholesterol   . Hypertension   . Neuromuscular disorder ([place])    chronic inflammatory demyelinating polyneuropathy   . Stroke [facility])    2011 with residual deficit left sided weakness  . Tobacco dependence     Patient Active Problem List   Diagnosis Date Noted  . Status cardiac pacemaker 01/29/2017  . Unintended weight loss 01/29/2017  . Tinea pedis of right foot 01/29/2017  . Hypercalcemia 01/15/2017  . Heart block 01/15/2017  . Constipation 09/12/2016  . Glaucoma 09/12/2016  . Liver hemangioma 08/14/2016  . Renal cyst 08/14/2016  . Renal mass, right 08/14/2016  . Elevated liver enzymes 08/10/2016  . CKD (chronic kidney disease) stage 3, GFR 30-59  ml/min ([name]) 08/16/2015  . At high risk for falls 08/16/2015  . Subcutaneous nodules 08/16/2015  . Prediabetes 10/07/2013  . Inguinal hernia 03/25/2013  . History of CVA with residual deficit 03/25/2013  . HTN (hypertension) 10/09/2012  . Tobacco abuse 10/09/2012    Past Surgical History:  Procedure Laterality Date  . EYE SURGERY    . INGUINAL HERNIA REPAIR Right 11/23/2014   Procedure: right inguinal hernia repair with mesh;  Surgeon: Armandina Gemma, MD;  Location: [facility] ORS;  Service: General;  Laterality: Right;  . INSERTION OF MESH N/A 11/23/2014   Procedure: INSERTION OF MESH;  Surgeon: Armandina Gemma, MD;  Location: [facility] ORS;  Service: General;  Laterality: N/A;  . PACEMAKER IMPLANT N/A 01/16/2017   Procedure: Pacemaker Implant;  Surgeon: Will Meredith Leeds, MD;  Location: [facility] CV LAB;  Service: Cardiovascular;  Laterality: N/A;  . SHOULDER SURGERY Bilateral 1988, 1998       Home Medications    Prior to Admission medications   Medication Sig Start Date End Date Taking? Authorizing Provider  amLODipine (NORVASC) 10 MG tablet Take 1 tablet (10 mg total) by mouth every morning. 05/03/17   Boykin Nearing, MD  aspirin EC 81 MG tablet Take 1 tablet (81 mg total) by mouth daily. 02/20/17   Funches, Adriana Mccallum, MD  atorvastatin (LIPITOR) 40 MG tablet Take 1 tablet (40 mg total) by mouth daily. 05/03/17   Funches, Adriana Mccallum, MD  brimonidine (ALPHAGAN P) 0.1 % SOLN Place 1  drop into both eyes 2 (two) times daily.    [provider]  dorzolamide-timolol (COSOPT) 22.3-6.8 MG/ML ophthalmic solution Place 1 drop into both eyes 2 (two) times daily.    [provider]  fluticasone (FLONASE) 50 MCG/ACT nasal spray Place 2 sprays into both nostrils daily. 09/12/16   Funches, Adriana Mccallum, MD  hydrALAZINE (APRESOLINE) 50 MG tablet Take 1 tablet (50 mg total) by mouth 3 (three) times daily. 05/03/17   Funches, Adriana Mccallum, MD  latanoprost (XALATAN) 0.005 % ophthalmic solution Place 1 drop into both  eyes at bedtime.    [provider]  lisinopril (PRINIVIL,ZESTRIL) 20 MG tablet Take 1 tablet (20 mg total) by mouth daily. 05/03/17   Funches, Adriana Mccallum, MD  metoprolol tartrate (LOPRESSOR) 25 MG tablet Take 1 tablet (25 mg total) by mouth 2 (two) times daily. 05/02/17 07/31/17  Camnitz, Ocie Doyne, MD  Multiple Vitamins-Iron (DAILY VITAMIN FORMULA+IRON) TABS Take 1 tablet by mouth daily.    [provider]  Psyllium (EQ DAILY FIBER PO) Take 1 tablet by mouth daily.    [provider]  terbinafine (LAMISIL AT) 1 % cream Apply 1 application topically 2 (two) times daily. To R foot 01/29/17   Funches, Adriana Mccallum, MD  torsemide (DEMADEX) 20 MG tablet Take 20 mg by mouth daily.    [provider]  varenicline (CHANTIX CONTINUING MONTH PAK) 1 MG tablet Take 1 tablet (1 mg total) by mouth 2 (two) times daily. For PASS 02/20/17   Boykin Nearing, MD  varenicline (CHANTIX STARTING MONTH PAK) 0.5 MG X 11 & 1 MG X 42 tablet Take by mouth, Taper per packet insert 02/20/17   Boykin Nearing, MD    Family History Family History  Problem Relation Age of Onset  . Hypertension Mother   . Diabetes Sister   . Hypertension Sister     Social History Social History  Substance Use Topics  . Smoking status: Current Every Day Smoker    Packs/day: 1.00    Years: 40.00    Types: Cigarettes  . Smokeless tobacco: Never Used  . Alcohol use No     Comment: alcohol free for 1 year was drinking 1/2 pint per day      Allergies   Doxycycline; Atacand hct [candesartan cilexetil-hctz]; and Shellfish allergy   Review of Systems Review of Systems  Unable to perform ROS: Mental status change     Physical Exam Updated Vital Signs Ht 6\' 4"  (1.93 m)   Wt 81.6 kg (180 lb)   BMI 21.91 kg/m   Physical Exam  Constitutional: He has a sickly appearance.  sickly elderly maleshaking, not responding to verbal stimuli  HENT:  Head: Normocephalic and atraumatic.  Eyes: Conjunctivae and EOM  are normal.  Cardiovascular: Normal rate and regular rhythm.   Pulmonary/Chest: Effort normal. No stridor. No respiratory distress.  Abdominal: He exhibits no distension.  Musculoskeletal: He exhibits no edema.  Neurological: He displays atrophy and tremor. He exhibits normal muscle tone. He displays seizure activity.  Skin: Skin is warm and dry.  Psychiatric: Cognition and memory are impaired.  Nursing note and vitals reviewed.    ED Treatments / Results  Labs (all labs ordered are listed, but only abnormal results are displayed) Labs Reviewed  COMPREHENSIVE METABOLIC PANEL - Abnormal; Notable for the following:       Result Value   CO2 20 (*)    Glucose, Bld 101 (*)    Creatinine, Ser 1.85 (*)    Albumin 3.4 (*)  ALT 6 (*)    GFR calc non Af Amer 37 (*)    GFR calc Af Amer 42 (*)    All other components within normal limits  CBC WITH DIFFERENTIAL/PLATELET - Abnormal; Notable for the following:    Hemoglobin 12.2 (*)    HCT 38.5 (*)    All other components within normal limits  TROPONIN I  URINALYSIS, COMPLETE (UACMP) WITH MICROSCOPIC  MAGNESIUM  CBG MONITORING, ED    EKG  EKG Interpretation  Date/Time:  Saturday July 07 2017 20:56:16 EDT Ventricular Rate:  97 PR Interval:    QRS Duration: 127 QT Interval:  373 QTC Calculation: 474 R Axis:   -61 Text Interpretation:  Sinus rhythm Borderline prolonged PR interval RBBB and LAFB ST-t wave abnormality Abnormal ekg Confirmed by Carmin Muskrat [phone]) on 07/07/2017 9:07:20 PM       Radiology Ct Head Wo Contrast  Result Date: 07/07/2017 CLINICAL DATA:  Acute onset of seizure.  Initial encounter. EXAM: CT HEAD WITHOUT CONTRAST TECHNIQUE: Contiguous axial images were obtained from the base of the skull through the vertex without intravenous contrast. COMPARISON:  CT of the head performed 12/06/2016 FINDINGS: Brain: No evidence of acute infarction, hemorrhage, hydrocephalus, extra-axial collection or mass  lesion/mass effect. Prominence of the ventricles and sulci reflects mild cortical volume loss. A chronic infarct noted at the right occipital lobe, with associated encephalomalacia. Diffuse periventricular and subcortical white matter likely reflects small vessel ischemic microangiopathy. Chronic lacunar infarcts are noted at the right basal ganglia and bilateral thalami. Cerebellar atrophy is noted. The brainstem and fourth ventricle are within normal limits. No mass effect or midline shift is seen. Vascular: No hyperdense vessel or unexpected calcification. Skull: There is no evidence of fracture; visualized osseous structures are unremarkable in appearance. Sinuses/Orbits: The orbits are within normal limits. A mucus retention cyst or polyp is noted at the left maxillary sinus. The remaining paranasal sinuses and mastoid air cells are well-aerated. Other: No significant soft tissue abnormalities are seen. IMPRESSION: 1. No acute intracranial pathology seen on CT. 2. Mild cortical volume loss and diffuse small vessel ischemic microangiopathy. 3. Chronic infarct at the right occipital lobe, with associated encephalomalacia. Chronic lacunar infarcts at the right basal ganglia and bilateral thalami. 4. Mucus retention cyst or polyp at the left maxillary sinus. Electronically Signed   By: Garald Balding M.D.   On: 07/07/2017 21:49   Dg Chest Port 1 View  Result Date: 07/07/2017 CLINICAL DATA:  Initial evaluation for acute seizure. EXAM: PORTABLE CHEST 1 VIEW COMPARISON:  Prior radiograph from 01/17/2017. FINDINGS: Patient is rotated to the left. Cardiomegaly is unchanged. Mediastinal silhouette unchanged allowing for patient rotation. Aortic atherosclerosis. Left-sided Almyra Free transvenous pacemaker/ AICD noted. Defibrillator pad overlies left chest. Lungs hypoinflated. Mild perihilar vascular congestion without overt pulmonary edema. No focal infiltrates. No significant pleural effusion. No pneumothorax. No acute  osseus abnormality.  Osteopenia noted. IMPRESSION: 1. Stable cardiomegaly with mild perihilar vascular congestion without pulmonary edema. 2. No other active cardiopulmonary disease. Electronically Signed   By: Jeannine Boga M.D.   On: 07/07/2017 21:15    Procedures Procedures (including critical care time)  Medications Ordered in ED Medications  midazolam (VERSED) injection 4 mg (not administered)  midazolam (VERSED) 2 MG/2ML injection (not administered)  midazolam (VERSED) 2 MG/2ML injection (not administered)  valproate (DEPACON) 1,700 mg in dextrose 5 % 50 mL IVPB (not administered)     Initial Impression / Assessment and Plan / ED Course  I have reviewed the  triage vital signs and the nursing notes.  Pertinent labs & imaging results that were available during my care of the patient were reviewed by me and considered in my medical decision making (see chart for details).    After the initial evaluation with concern for ongoing seizure activity the patient received additional Versed.  11:14 PM Patient awake, though oriented only to self.  He remains clinically stable. I discussed his presentation with his family members, in thecorroborate thehistory provided by EMS, the patient was in his usual state of health until having multiple seizure-like episodes. He also notes the patient has no history of seizures.  Subsequently I discussed patient's case with our neuro team, and the patient will be started on valproic acid, have MRI, and consideration of EEG tomorrow. With concern for new seizure activity in this elderly male he was admitted for further evaluation and management to our hospitalist team. Final Clinical Impressions(s) / ED Diagnoses  Seizure, initial encounter   Carmin Muskrat, MD 07/07/17 2315

## 2017-07-07 NOTE — ED Notes (Signed)
PT GIVEN VERSED  4MG  IV  OFR MOVEMENT TO ALLOW THE SCAN

## 2017-07-07 NOTE — ED Triage Notes (Signed)
THE  PT  HAS BEEN GIVEN VERSED 5MG    IV ON THE WAY HERE

## 2017-07-08 ENCOUNTER — Inpatient Hospital Stay (HOSPITAL_COMMUNITY): Payer: Medicare PPO

## 2017-07-08 ENCOUNTER — Encounter (HOSPITAL_COMMUNITY): Payer: Self-pay | Admitting: Internal Medicine

## 2017-07-08 DIAGNOSIS — R1312 Dysphagia, oropharyngeal phase: Secondary | ICD-10-CM | POA: Diagnosis present

## 2017-07-08 DIAGNOSIS — R402362 Coma scale, best motor response, obeys commands, at arrival to emergency department: Secondary | ICD-10-CM | POA: Diagnosis present

## 2017-07-08 DIAGNOSIS — N183 Chronic kidney disease, stage 3 (moderate): Secondary | ICD-10-CM | POA: Diagnosis present

## 2017-07-08 DIAGNOSIS — Z888 Allergy status to other drugs, medicaments and biological substances status: Secondary | ICD-10-CM | POA: Diagnosis not present

## 2017-07-08 DIAGNOSIS — F17210 Nicotine dependence, cigarettes, uncomplicated: Secondary | ICD-10-CM | POA: Diagnosis present

## 2017-07-08 DIAGNOSIS — R402142 Coma scale, eyes open, spontaneous, at arrival to emergency department: Secondary | ICD-10-CM | POA: Diagnosis present

## 2017-07-08 DIAGNOSIS — R001 Bradycardia, unspecified: Secondary | ICD-10-CM | POA: Diagnosis not present

## 2017-07-08 DIAGNOSIS — Z883 Allergy status to other anti-infective agents status: Secondary | ICD-10-CM | POA: Diagnosis not present

## 2017-07-08 DIAGNOSIS — Z91013 Allergy to seafood: Secondary | ICD-10-CM | POA: Diagnosis not present

## 2017-07-08 DIAGNOSIS — I6930 Unspecified sequelae of cerebral infarction: Secondary | ICD-10-CM | POA: Diagnosis not present

## 2017-07-08 DIAGNOSIS — I129 Hypertensive chronic kidney disease with stage 1 through stage 4 chronic kidney disease, or unspecified chronic kidney disease: Secondary | ICD-10-CM | POA: Diagnosis present

## 2017-07-08 DIAGNOSIS — I69354 Hemiplegia and hemiparesis following cerebral infarction affecting left non-dominant side: Secondary | ICD-10-CM | POA: Diagnosis not present

## 2017-07-08 DIAGNOSIS — G40909 Epilepsy, unspecified, not intractable, without status epilepticus: Secondary | ICD-10-CM

## 2017-07-08 DIAGNOSIS — Z8249 Family history of ischemic heart disease and other diseases of the circulatory system: Secondary | ICD-10-CM | POA: Diagnosis not present

## 2017-07-08 DIAGNOSIS — R569 Unspecified convulsions: Secondary | ICD-10-CM | POA: Diagnosis present

## 2017-07-08 DIAGNOSIS — Z9181 History of falling: Secondary | ICD-10-CM | POA: Diagnosis not present

## 2017-07-08 DIAGNOSIS — E7800 Pure hypercholesterolemia, unspecified: Secondary | ICD-10-CM | POA: Diagnosis present

## 2017-07-08 DIAGNOSIS — I10 Essential (primary) hypertension: Secondary | ICD-10-CM | POA: Diagnosis not present

## 2017-07-08 DIAGNOSIS — Z950 Presence of cardiac pacemaker: Secondary | ICD-10-CM | POA: Diagnosis not present

## 2017-07-08 DIAGNOSIS — G6181 Chronic inflammatory demyelinating polyneuritis: Secondary | ICD-10-CM | POA: Diagnosis present

## 2017-07-08 DIAGNOSIS — Z833 Family history of diabetes mellitus: Secondary | ICD-10-CM | POA: Diagnosis not present

## 2017-07-08 DIAGNOSIS — Z7951 Long term (current) use of inhaled steroids: Secondary | ICD-10-CM | POA: Diagnosis not present

## 2017-07-08 DIAGNOSIS — R402252 Coma scale, best verbal response, oriented, at arrival to emergency department: Secondary | ICD-10-CM | POA: Diagnosis present

## 2017-07-08 DIAGNOSIS — D62 Acute posthemorrhagic anemia: Secondary | ICD-10-CM | POA: Diagnosis not present

## 2017-07-08 DIAGNOSIS — H547 Unspecified visual loss: Secondary | ICD-10-CM | POA: Diagnosis present

## 2017-07-08 DIAGNOSIS — H409 Unspecified glaucoma: Secondary | ICD-10-CM | POA: Diagnosis present

## 2017-07-08 DIAGNOSIS — K219 Gastro-esophageal reflux disease without esophagitis: Secondary | ICD-10-CM | POA: Diagnosis present

## 2017-07-08 DIAGNOSIS — Z7982 Long term (current) use of aspirin: Secondary | ICD-10-CM | POA: Diagnosis not present

## 2017-07-08 DIAGNOSIS — Z720 Tobacco use: Secondary | ICD-10-CM | POA: Diagnosis not present

## 2017-07-08 HISTORY — DX: Unspecified convulsions: R56.9

## 2017-07-08 LAB — URINALYSIS, COMPLETE (UACMP) WITH MICROSCOPIC
BILIRUBIN URINE: NEGATIVE
Bacteria, UA: NONE SEEN
GLUCOSE, UA: NEGATIVE mg/dL
Hgb urine dipstick: NEGATIVE
KETONES UR: NEGATIVE mg/dL
LEUKOCYTES UA: NEGATIVE
Nitrite: NEGATIVE
PH: 6 (ref 5.0–8.0)
PROTEIN: 30 mg/dL — AB
RBC / HPF: NONE SEEN RBC/hpf (ref 0–5)
SQUAMOUS EPITHELIAL / LPF: NONE SEEN
Specific Gravity, Urine: 1.01 (ref 1.005–1.030)
WBC UA: NONE SEEN WBC/hpf (ref 0–5)

## 2017-07-08 LAB — CBC
HEMATOCRIT: 34.5 % — AB (ref 39.0–52.0)
HEMOGLOBIN: 11.1 g/dL — AB (ref 13.0–17.0)
MCH: 28.8 pg (ref 26.0–34.0)
MCHC: 32.2 g/dL (ref 30.0–36.0)
MCV: 89.6 fL (ref 78.0–100.0)
Platelets: 230 10*3/uL (ref 150–400)
RBC: 3.85 MIL/uL — ABNORMAL LOW (ref 4.22–5.81)
RDW: 14.3 % (ref 11.5–15.5)
WBC: 9.2 10*3/uL (ref 4.0–10.5)

## 2017-07-08 LAB — MAGNESIUM
Magnesium: 2 mg/dL (ref 1.7–2.4)
Magnesium: 1.8 mg/dL (ref 1.7–2.4)

## 2017-07-08 LAB — BASIC METABOLIC PANEL
ANION GAP: 6 (ref 5–15)
BUN: 18 mg/dL (ref 6–20)
CO2: 22 mmol/L (ref 22–32)
Calcium: 9.2 mg/dL (ref 8.9–10.3)
Chloride: 108 mmol/L (ref 101–111)
Creatinine, Ser: 2.07 mg/dL — ABNORMAL HIGH (ref 0.61–1.24)
GFR calc Af Amer: 37 mL/min — ABNORMAL LOW (ref >60)
GFR, EST NON AFRICAN AMERICAN: 32 mL/min — AB (ref >60)
GLUCOSE: 162 mg/dL — AB (ref 65–99)
POTASSIUM: 4.3 mmol/L (ref 3.5–5.1)
Sodium: 136 mmol/L (ref 135–145)

## 2017-07-08 LAB — RAPID URINE DRUG SCREEN, HOSP PERFORMED
Amphetamines: NOT DETECTED
BARBITURATES: NOT DETECTED
Benzodiazepines: POSITIVE — AB
COCAINE: NOT DETECTED
OPIATES: NOT DETECTED
Tetrahydrocannabinol: NOT DETECTED

## 2017-07-08 LAB — TSH: TSH: 0.476 u[IU]/mL (ref 0.350–4.500)

## 2017-07-08 LAB — PHOSPHORUS: PHOSPHORUS: 2.6 mg/dL (ref 2.5–4.6)

## 2017-07-08 MED ORDER — ACETAMINOPHEN 325 MG PO TABS: 650.0000 mg | ORAL_TABLET | Freq: Four times a day (QID) | ORAL | Status: DC | PRN

## 2017-07-08 MED ORDER — CHLORHEXIDINE GLUCONATE CLOTH 2 % EX PADS: 6.0000 | MEDICATED_PAD | Freq: Once | CUTANEOUS | Status: DC

## 2017-07-08 MED ORDER — LORAZEPAM 2 MG/ML IJ SOLN
1.0000 mg | INTRAMUSCULAR | Status: DC | PRN
  Administered 2017-07-09: 1 mg via INTRAVENOUS
  Filled 2017-07-08: qty 1

## 2017-07-08 MED ORDER — FOLIC ACID 5 MG/ML IJ SOLN
1.0000 mg | Freq: Every day | INTRAMUSCULAR | Status: DC
  Administered 2017-07-08 – 2017-07-10 (×3): 1 mg via INTRAVENOUS
  Filled 2017-07-08 (×3): qty 0.2

## 2017-07-08 MED ORDER — SODIUM CHLORIDE 0.9 % IV SOLN
INTRAVENOUS | Status: DC
  Administered 2017-07-08: 08:00:00 via INTRAVENOUS

## 2017-07-08 MED ORDER — ONDANSETRON HCL 4 MG PO TABS: 4.0000 mg | ORAL_TABLET | Freq: Four times a day (QID) | ORAL | Status: DC | PRN

## 2017-07-08 MED ORDER — SODIUM CHLORIDE 0.9 % IV SOLN
75.0000 mL/h | INTRAVENOUS | Status: DC
  Administered 2017-07-08 – 2017-07-12 (×5): 75 mL/h via INTRAVENOUS

## 2017-07-08 MED ORDER — ONDANSETRON HCL 4 MG/2ML IJ SOLN: 4.0000 mg | Freq: Four times a day (QID) | INTRAMUSCULAR | Status: DC | PRN

## 2017-07-08 MED ORDER — ACETAMINOPHEN 650 MG RE SUPP: 650.0000 mg | Freq: Four times a day (QID) | RECTAL | Status: DC | PRN

## 2017-07-08 MED ORDER — ACETAMINOPHEN 500 MG PO TABS: 1000.0000 mg | ORAL_TABLET | ORAL | Status: AC

## 2017-07-08 MED ORDER — ASPIRIN EC 81 MG PO TBEC
81.0000 mg | DELAYED_RELEASE_TABLET | Freq: Every day | ORAL | Status: DC
  Administered 2017-07-09 – 2017-07-12 (×4): 81 mg via ORAL
  Filled 2017-07-08 (×5): qty 1

## 2017-07-08 MED ORDER — CEFAZOLIN SODIUM-DEXTROSE 2-4 GM/100ML-% IV SOLN
2.0000 g | INTRAVENOUS | Status: AC
  Filled 2017-07-08: qty 100

## 2017-07-08 MED ORDER — THIAMINE HCL 100 MG/ML IJ SOLN
100.0000 mg | Freq: Every day | INTRAMUSCULAR | Status: DC
  Administered 2017-07-09 – 2017-07-10 (×2): 100 mg via INTRAVENOUS
  Filled 2017-07-08 (×2): qty 2

## 2017-07-08 MED ORDER — HEPARIN SODIUM (PORCINE) 5000 UNIT/ML IJ SOLN
5000.0000 [IU] | Freq: Three times a day (TID) | INTRAMUSCULAR | Status: DC
  Administered 2017-07-08 – 2017-07-12 (×12): 5000 [IU] via SUBCUTANEOUS
  Filled 2017-07-08 (×12): qty 1

## 2017-07-08 NOTE — Progress Notes (Signed)
EEG completed; results pending.    

## 2017-07-08 NOTE — Evaluation (Signed)
Clinical/Bedside Swallow Evaluation Patient Details  Name: Darrell [name] MRN: [id number] Date of Birth: February 29, 1951  Today's Date: 07/08/2017 Time: SLP Start Time (ACUTE ONLY): 6073 SLP Stop Time (ACUTE ONLY): 1403 SLP Time Calculation (min) (ACUTE ONLY): 18 min  Past Medical History:  Past Medical History:  Diagnosis Date  . Chronic kidney disease    stage 3 GFR 30-59 ml/min   . Dysphagia as late effect of cerebrovascular disease    pts wife states pt has to eat soft foods   . GERD (gastroesophageal reflux disease)   . Glaucoma   . High cholesterol   . Hypertension   . Neuromuscular disorder ([place])    chronic inflammatory demyelinating polyneuropathy   . Stroke [facility])    2011 with residual deficit left sided weakness  . Tobacco dependence    Past Surgical History:  Past Surgical History:  Procedure Laterality Date  . EYE SURGERY    . INGUINAL HERNIA REPAIR Right 11/23/2014   Procedure: right inguinal hernia repair with mesh;  Surgeon: Armandina Gemma, MD;  Location: [facility] ORS;  Service: General;  Laterality: Right;  . INSERTION OF MESH N/A 11/23/2014   Procedure: INSERTION OF MESH;  Surgeon: Armandina Gemma, MD;  Location: [facility] ORS;  Service: General;  Laterality: N/A;  . PACEMAKER IMPLANT N/A 01/16/2017   Procedure: Pacemaker Implant;  Surgeon: Will Meredith Leeds, MD;  Location: [facility] CV LAB;  Service: Cardiovascular;  Laterality: N/A;  . SHOULDER SURGERY Bilateral 1988, 19916   HPI:  66 year old history of prior CVA, CK D, dysphagia, GERD, HLD, HTN presents with new onset seizures. Treated with Versed and then loaded with Depakote in ER. ER M.D. discuss case with neurology who recommends admission to medicine for EEG in a.m. recommended initially MRI unable to obtain as patient does have history of pacemaker.  CT head unremarkable.     Assessment / Plan / Recommendation Clinical Impression  Clinical swallowing evaluation was completed using thin liquids, pureed material and soft  solids in setting for admission for new onset seizures.  Most recent chest xray is showing mild perihilar vascular congestion.  Of note the patient is known to ST service from previous MBS dated 11/05/2013 which did show intermittent penetration of thin liquids. The patient had no recollection of that study, however, his wife did recall him having the MBS.   Neither report history of respiratory issues.  At that time, he was recommended to be on a dysphagia 3 diet with thin liquids taking small sips of liquids.  Oral mechanism exam revealed lingual, labial and facial weakness on the left.  Lingual range of motion and jaw range of motion/strength were good.  The patient is noted to have a slight breathy quality to voice with low vocal volume that both he and wife reported to be at least for the last year.  Volitional cough was weak.   The patient complains of intermittent coughing with intake.  Given intake today the patient was noted to have a probable oral and pharyngeal dysphagia.  The oral phase was characterized by delayed oral transit across textures and oral residue given soft solids.  This was possibly impacted by lack of dentition and the patient's upper dentures were not available.  The pharyngeal phase was characterized by a possibly delayed swallow trigger.  Overt s/s of aspiration were not seen.  Recommend a chopped diet with thin liquids.  ST will follow up for therapeutic diet tolerance and need for possible instrumental exam.  SLP Visit Diagnosis: Dysphagia, oropharyngeal phase (R13.12)    Aspiration Risk  Mild aspiration risk    Diet Recommendation   Dysphagia 3 (chopped) with thin liquids.  Medication Administration: Crushed with puree    Other  Recommendations Oral Care Recommendations: Oral care BID   Follow up Recommendations Other (comment) (TBD)      Frequency and Duration min 2x/week  2 weeks       Prognosis Prognosis for Safe Diet Advancement: Fair Barriers to Reach  Goals: Cognitive deficits      Swallow Study   General Date of Onset: 07/07/17 HPI: 66 year old history of prior CVA, CK D, dysphagia, GERD, HLD, HTN presents with new onset seizures. Treated with Versed and then loaded with Depakote in ER. ER M.D. discuss case with neurology who recommends admission to medicine for EEG in a.m. recommended initially MRI unable to obtain as patient does have history of pacemaker.  CT head unremarkable.   Type of Study: Bedside Swallow Evaluation Previous Swallow Assessment: 11/05/2013 MBS which did show intermittent penetration of thin liquids.  Rx at that time was for a chopped diet with thin liquids.  Small sips.   Diet Prior to this Study: NPO Respiratory Status: Room air History of Recent Intubation: No Behavior/Cognition: Alert;Cooperative Oral Cavity Assessment: Within Functional Limits Oral Care Completed by SLP: No Oral Cavity - Dentition: Dentures, top;Dentures, not available;Missing dentition Vision: Functional for self-feeding Self-Feeding Abilities: Able to feed self Patient Positioning: Upright in bed Baseline Vocal Quality: Breathy;Low vocal intensity Volitional Cough: Weak Volitional Swallow: Able to elicit    Oral/Motor/Sensory Function Overall Oral Motor/Sensory Function: Mild impairment Facial ROM: Reduced left Facial Symmetry: Abnormal symmetry left Facial Strength: Reduced left Lingual ROM: Reduced left Lingual Symmetry: Abnormal symmetry left Lingual Strength: Reduced Mandible: Within Functional Limits   Ice Chips Ice chips: Not tested   Thin Liquid Thin Liquid: Impaired Presentation: Cup;Spoon;Self Fed Oral Phase Impairments: Impaired mastication Oral Phase Functional Implications: Prolonged oral transit Pharyngeal  Phase Impairments: Suspected delayed Swallow    Nectar Thick Nectar Thick Liquid: Not tested   Honey Thick Honey Thick Liquid: Not tested   Puree Puree: Impaired Presentation: Spoon Oral Phase Impairments:  Impaired mastication Oral Phase Functional Implications: Prolonged oral transit Pharyngeal Phase Impairments: Suspected delayed Swallow   Solid   GO   Solid: Impaired Presentation: Spoon Oral Phase Impairments: Impaired mastication Oral Phase Functional Implications: Oral residue;Prolonged oral transit Pharyngeal Phase Impairments: Suspected delayed Swallow        Shelly Flatten, MA, CCC-SLP Acute Rehab SLP [phone] Lamar Sprinkles 07/08/2017,2:22 PM

## 2017-07-08 NOTE — ED Notes (Signed)
Patient unable to have MRI due to pacemaker per MRI staff

## 2017-07-08 NOTE — ED Notes (Signed)
Speech therapy at bedside.

## 2017-07-08 NOTE — Plan of Care (Signed)
NEEDS to BE SEEN  66 year old history of prior CVA, CK D, dysphagia, GERD, HLD, HTN presents with new onset seizures. Treated with Versed and then loaded with Depakote in ER. ER M.D. discuss case with neurology who recommends admission to medicine for EEG in a.m. recommended initially MRI unable to obtain as patient does have history of pacemaker Please reconsult neurology if needed  CT head unremarkable  Accepted to telemetry Seizure precautions Will need orders  Meya Clutter 5:37 AM

## 2017-07-08 NOTE — Procedures (Signed)
EEG Report  Clinical History:  New onset seizure.  Technical Summary:  A 19 channel digital EEG recording was performed using the 10-20 international system of electrode placement.  Bipolar and Referential montages were used.  The total recording time was approx 20 minutes.  Findings:  There is a posterior dominant rhythm of 7-8 Hz reactive to eye opening and closure.  No focal slowing is present.  No epileptiform discharges or seizures.  Impression:  There is evidence of mild generalized slowing of brain activity consistent with probable post-ictal state +/- toxic, metabolic, or infectious encephalopathy.  The patient is not in non-convulsive status epilepticus.   Rogue Jury, MS, MD

## 2017-07-08 NOTE — H&P (Signed)
[facility] History and Physical  Darrell [name] ZHG:[id number] DOB: [birth year] DOA: 07/07/2017  Referring physician:  PCP: Boykin Nearing, MD   Chief Complaint: new-onset seizure  HPI: Darrell [name] is a 66 y.o. BM PMHx  CVA with residual dysphagia, Neuromuscular disorder(chronic inflammatory demyelinating polyneuropathy) HTN,Symptomatic Bradycardia S/P pacemaker 12/2016, HLD, tobacco abuse, CKD stage 3  Presented by EMS after witnessed seizure,with altered mental status(postictal) .In route the patient continued to have seizure-like activity, and received Versed, 5 mg per This seemed to slow the seizure activity, though did not cease entirely.The patient himself has a nasal trumpet in place, is actively receiving supplemental oxygen, his not responding to verbal stimuli, level 5 caveat secondary to change in mental status. No previous history of seizure. Uses walker at home for ambulation. Per wife patient had seizure activity for ~10-15 minutes which spontaneously resolved just prior to EMS arriving on seen.states negative loss of bowel or bladder control. No new changes to medication. Does not drink. Per [facility] case discussed with Neurology who recommended EEG   Review of Systems:  Constitutional:  No weight loss, night sweats, Fevers, chills, fatigue.  HEENT:  No headaches, Difficulty swallowing,Tooth/dental problems,Sore throat,  No sneezing, itching, ear ache, nasal congestion, post nasal drip,  Cardio-vascular:  No chest pain, Orthopnea, PND, swelling in lower extremities, anasarca, dizziness, palpitations  GI:  No heartburn, indigestion, abdominal pain, nausea, vomiting, diarrhea, change in bowel habits, loss of appetite  Resp:  No shortness of breath with exertion or at rest. No excess mucus, no productive cough, No non-productive cough, No coughing up of blood.No change in color of mucus.No wheezing.No chest wall deformity  Skin:  no rash or lesions.  GU:  no  dysuria, change in color of urine, no urgency or frequency. No flank pain.  Musculoskeletal:  No joint pain or swelling. No decreased range of motion. No back pain.  Psych:  No change in mood or affect. No depression or anxiety. No memory loss.   Past Medical History:  Diagnosis Date  . Chronic kidney disease    stage 3 GFR 30-59 ml/min   . Dysphagia as late effect of cerebrovascular disease    pts wife states pt has to eat soft foods   . GERD (gastroesophageal reflux disease)   . Glaucoma   . High cholesterol   . Hypertension   . Neuromuscular disorder ([place])    chronic inflammatory demyelinating polyneuropathy   . Stroke [facility])    2011 with residual deficit left sided weakness  . Tobacco dependence    Past Surgical History:  Procedure Laterality Date  . EYE SURGERY    . INGUINAL HERNIA REPAIR Right 11/23/2014   Procedure: right inguinal hernia repair with mesh;  Surgeon: Armandina Gemma, MD;  Location: [facility];  Service: General;  Laterality: Right;  . INSERTION OF MESH N/A 11/23/2014   Procedure: INSERTION OF MESH;  Surgeon: Armandina Gemma, MD;  Location: [facility];  Service: General;  Laterality: N/A;  . PACEMAKER IMPLANT N/A 01/16/2017   Procedure: Pacemaker Implant;  Surgeon: Will Meredith Leeds, MD;  Location: [facility];  Service: Cardiovascular;  Laterality: N/A;  . SHOULDER SURGERY Bilateral 1988, 1998   Social History:  reports that he has been smoking Cigarettes.  He has a 40.00 pack-year smoking history. He has never used smokeless tobacco. He reports that he does not drink alcohol or use drugs.  Allergies  Allergen Reactions  . Doxycycline Other (See Comments)    Hiccups, cough,  nausea and emesis, elevated liver enzymes, elevated eosinophils, SOB concerning for early DRESS syndrome   . [place] Hct [Candesartan Cilexetil-Hctz] Hives  . Shellfish Allergy Hives    Family History  Problem Relation Age of Onset  . Hypertension Mother   . Diabetes Sister   .  Hypertension Sister     Prior to Admission medications   Medication Sig Start Date End Date Taking? Authorizing Provider  amLODipine (NORVASC) 10 MG tablet Take 1 tablet (10 mg total) by mouth every morning. 05/03/17  Yes Funches, Adriana Mccallum, MD  aspirin EC 81 MG tablet Take 1 tablet (81 mg total) by mouth daily. 02/20/17  Yes Funches, Josalyn, MD  atorvastatin (LIPITOR) 40 MG tablet Take 1 tablet (40 mg total) by mouth daily. 05/03/17  Yes Funches, Josalyn, MD  brimonidine (ALPHAGAN P) 0.1 % SOLN Place 1 drop into both eyes 2 (two) times daily.   Yes [provider]  dorzolamide-timolol (COSOPT) 22.3-6.8 MG/ML ophthalmic solution Place 1 drop into both eyes 2 (two) times daily.   Yes [provider]  esomeprazole (NEXIUM) 20 MG capsule Take 20 mg by mouth daily at 12 noon.   Yes [provider]  fluticasone (FLONASE) 50 MCG/ACT nasal spray Place 2 sprays into both nostrils daily. Patient taking differently: Place 2 sprays into both nostrils daily as needed for allergies.  09/12/16  Yes Funches, Josalyn, MD  hydrALAZINE (APRESOLINE) 50 MG tablet Take 1 tablet (50 mg total) by mouth 3 (three) times daily. 05/03/17  Yes Funches, Josalyn, MD  ketoconazole (NIZORAL) 2 % cream Apply 1 application topically daily as needed for irritation.   Yes [provider]  latanoprost (XALATAN) 0.005 % ophthalmic solution Place 1 drop into both eyes at bedtime.   Yes [provider]  lisinopril (PRINIVIL,ZESTRIL) 20 MG tablet Take 1 tablet (20 mg total) by mouth daily. 05/03/17  Yes Funches, Josalyn, MD  metoprolol tartrate (LOPRESSOR) 25 MG tablet Take 1 tablet (25 mg total) by mouth 2 (two) times daily. 05/02/17 07/31/17 Yes Camnitz, Will Hassell Done, MD  Multiple Vitamins-Iron (DAILY VITAMIN FORMULA+IRON) TABS Take 1 tablet by mouth daily.   Yes [provider]  Psyllium (EQ DAILY FIBER PO) Take 1 tablet by mouth daily.   Yes [provider]  terbinafine (LAMISIL AT) 1  % cream Apply 1 application topically 2 (two) times daily. To R foot Patient taking differently: Apply 1 application topically 2 (two) times daily as needed (itching). To R foot 01/29/17  Yes Funches, Josalyn, MD  torsemide (DEMADEX) 20 MG tablet Take 20 mg by mouth daily.   Yes [provider]     Consultants:  Phone consult Neurology   Procedures/Significant Events:  10/13 CT head WO contrast: -No acute intracranial pathology seen on CT. - small vessel ischemic microangiopathy. -Chronic infarct at the right occipital lobe, with associated encephalomalacia. Chronic lacunar infarcts at the right basal ganglia and bilateral thalami. - Mucus retention cyst or polyp at the left maxillary sinus. 10/14 ONG:EXBMW is evidence of mild generalized slowing of brain activity consistent with probable post-ictal state +/- toxic, metabolic, or infectious encephalopathy.  The patient is not in non-convulsive status epilepticus   I have personally reviewed and interpreted all radiology studies and my findings are as above.   VENTILATOR SETTINGS: none   Cultures none  Antimicrobials: none  Devices   LINES / TUBES:      Continuous Infusions: . sodium chloride 75 mL/hr at 07/08/17 0825    Physical Exam: Vitals:  07/08/17 0700 07/08/17 0730 07/08/17 0815 07/08/17 0845  BP: 129/87 130/86 (!) 149/99 138/89  Pulse: 77 72 79 76  Resp: 18 16 (!) 21 16  SpO2: 98% 97% 99% 97%  Weight:      Height:        Wt Readings from Last 3 Encounters:  07/07/17 180 lb (81.6 kg)  04/17/17 180 lb (81.6 kg)  02/20/17 175 lb 9.6 oz (79.7 kg)    General: A/O 3 (does not know when), No acute respiratory distress Eyes: negative scleral hemorrhage, negative anisocoria, negative icterus ENT: Negative Runny nose, negative gingival bleeding, Neck:  Negative scars, masses, torticollis, lymphadenopathy, JVD Lungs: Clear to auscultation bilaterally without wheezes or crackles Cardiovascular:  Regular rate and rhythm without murmur gallop or rub normal S1 and S2, pacer present left chest wall well-healed incision. Abdomen: negative abdominal pain, nondistended, positive soft, bowel sounds, no rebound, no ascites, no appreciable mass Extremities: No significant cyanosis, clubbing, or edema bilateral lower extremities Skin: Negative rashes, lesions, ulcers Psychiatric:  Negative depression, negative anxiety, negative fatigue, negative mania  Central nervous system:  Cranial nerves II through XII intact, tongue/uvula midline, all extremities muscle strength 5/5, sensation intact throughout, finger nose finger right hand WNL, left hand pass pointing.quick finger touch right hand WNL/unable to perform with left hand.heel to shin bilateral within normal limits, negative dysarthria, negative expressive aphasia, negative receptive aphasia.        Labs on Admission:  Basic Metabolic Panel:  Recent Labs Lab 07/07/17 2100  NA 136  K 4.4  CL 104  CO2 20*  GLUCOSE 101*  BUN 17  CREATININE 1.85*  CALCIUM 9.7  MG 2.0   Liver Function Tests:  Recent Labs Lab 07/07/17 2100  AST 18  ALT 6*  ALKPHOS 95  BILITOT 0.4  PROT 6.5  ALBUMIN 3.4*   No results for input(s): LIPASE, AMYLASE in the last 168 hours. No results for input(s): AMMONIA in the last 168 hours. CBC:  Recent Labs Lab 07/07/17 2100  WBC 9.8  NEUTROABS 5.1  HGB 12.2*  HCT 38.5*  MCV 90.2  PLT 275   Cardiac Enzymes:  Recent Labs Lab 07/07/17 2100  TROPONINI <0.03    BNP (last 3 results) No results for input(s): BNP in the last 8760 hours.  ProBNP (last 3 results) No results for input(s): PROBNP in the last 8760 hours.  CBG: No results for input(s): GLUCAP in the last 168 hours.  Radiological Exams on Admission: Ct Head Wo Contrast  Result Date: 07/07/2017 CLINICAL DATA:  Acute onset of seizure.  Initial encounter. EXAM: CT HEAD WITHOUT CONTRAST TECHNIQUE: Contiguous axial images were obtained  from the base of the skull through the vertex without intravenous contrast. COMPARISON:  CT of the head performed 12/06/2016 FINDINGS: Brain: No evidence of acute infarction, hemorrhage, hydrocephalus, extra-axial collection or mass lesion/mass effect. Prominence of the ventricles and sulci reflects mild cortical volume loss. A chronic infarct noted at the right occipital lobe, with associated encephalomalacia. Diffuse periventricular and subcortical white matter likely reflects small vessel ischemic microangiopathy. Chronic lacunar infarcts are noted at the right basal ganglia and bilateral thalami. Cerebellar atrophy is noted. The brainstem and fourth ventricle are within normal limits. No mass effect or midline shift is seen. Vascular: No hyperdense vessel or unexpected calcification. Skull: There is no evidence of fracture; visualized osseous structures are unremarkable in appearance. Sinuses/Orbits: The orbits are within normal limits. A mucus retention cyst or polyp is noted at the left maxillary sinus.  The remaining paranasal sinuses and mastoid air cells are well-aerated. Other: No significant soft tissue abnormalities are seen. IMPRESSION: 1. No acute intracranial pathology seen on CT. 2. Mild cortical volume loss and diffuse small vessel ischemic microangiopathy. 3. Chronic infarct at the right occipital lobe, with associated encephalomalacia. Chronic lacunar infarcts at the right basal ganglia and bilateral thalami. 4. Mucus retention cyst or polyp at the left maxillary sinus. Electronically Signed   By: Garald Balding M.D.   On: 07/07/2017 21:49   Dg Chest Port 1 View  Result Date: 07/07/2017 CLINICAL DATA:  Initial evaluation for acute seizure. EXAM: PORTABLE CHEST 1 VIEW COMPARISON:  Prior radiograph from 01/17/2017. FINDINGS: Patient is rotated to the left. Cardiomegaly is unchanged. Mediastinal silhouette unchanged allowing for patient rotation. Aortic atherosclerosis. Left-sided Almyra Free  transvenous pacemaker/ AICD noted. Defibrillator pad overlies left chest. Lungs hypoinflated. Mild perihilar vascular congestion without overt pulmonary edema. No focal infiltrates. No significant pleural effusion. No pneumothorax. No acute osseus abnormality.  Osteopenia noted. IMPRESSION: 1. Stable cardiomegaly with mild perihilar vascular congestion without pulmonary edema. 2. No other active cardiopulmonary disease. Electronically Signed   By: Jeannine Boga M.D.   On: 07/07/2017 21:15    EKG: Independently reviewed.    Assessment/Plan Active Problems:   HTN (hypertension)   Tobacco abuse   History of CVA with residual deficit   Prediabetes   CKD (chronic kidney disease) stage 3, GFR 30-59 ml/min ([name])   Status cardiac pacemaker   New onset seizure ([place])  CVA with Residual Dysphagia -swallow study pending  New onset Seizure -unable to obtain MRI secondary to pacer -EEG: Consistent with post ictal state see results above  -UDS: Positive for benzodiazepine, however patient received dose of benzodiazepine for seizure  -will observe patient overnight and no additional seizures prior to discharge will need to at least reconsult neurology over the phone the side on sending patient on antiseizure medication or not. Since no previous history of seizure most likely would not require antiseizure medication.  CKD stage III(Baseline Cr ~ 2.0)  -strict in and out -Monitor closely    Recent Labs Lab 07/07/17 2100 07/08/17 1328  CREATININE 1.85* 2.07*   Symptomatic Bradycardia -S/P pacemaker 12/2016 stable  Tobacco abuse     Code Status: Full  (DVT Prophylaxis:subcutaneous heparin Family Communication: discussed plan of care with wife at length  Disposition Plan: will observe overnight and if no additional seizure anticipate discharge on 10/15 (indicate anticipated LOS)   Data Reviewed: Care during the described time interval was provided by me .  I have reviewed this  patient's available data, including medical history, events of note, physical examination, and all test results as part of my evaluation.   Time spent: 60 min  [place] Pager [phone]

## 2017-07-08 NOTE — ED Notes (Signed)
EEG will be done in ED at bedside

## 2017-07-09 ENCOUNTER — Inpatient Hospital Stay (HOSPITAL_COMMUNITY): Payer: Medicare PPO

## 2017-07-09 DIAGNOSIS — Z9181 History of falling: Secondary | ICD-10-CM

## 2017-07-09 LAB — COMPREHENSIVE METABOLIC PANEL
ALBUMIN: 2.9 g/dL — AB (ref 3.5–5.0)
ALK PHOS: 81 U/L (ref 38–126)
ALT: 8 U/L — AB (ref 17–63)
AST: 13 U/L — AB (ref 15–41)
Anion gap: 6 (ref 5–15)
BUN: 17 mg/dL (ref 6–20)
CALCIUM: 9.2 mg/dL (ref 8.9–10.3)
CO2: 24 mmol/L (ref 22–32)
CREATININE: 1.92 mg/dL — AB (ref 0.61–1.24)
Chloride: 108 mmol/L (ref 101–111)
GFR calc Af Amer: 41 mL/min — ABNORMAL LOW (ref >60)
GFR calc non Af Amer: 35 mL/min — ABNORMAL LOW (ref >60)
GLUCOSE: 84 mg/dL (ref 65–99)
Potassium: 4.1 mmol/L (ref 3.5–5.1)
SODIUM: 138 mmol/L (ref 135–145)
Total Bilirubin: 0.7 mg/dL (ref 0.3–1.2)
Total Protein: 5.5 g/dL — ABNORMAL LOW (ref 6.5–8.1)

## 2017-07-09 LAB — MAGNESIUM: Magnesium: 1.6 mg/dL — ABNORMAL LOW (ref 1.7–2.4)

## 2017-07-09 MED ORDER — AMLODIPINE BESYLATE 5 MG PO TABS
10.0000 mg | ORAL_TABLET | Freq: Every morning | ORAL | Status: DC
  Administered 2017-07-09 – 2017-07-12 (×4): 10 mg via ORAL
  Filled 2017-07-09: qty 1
  Filled 2017-07-09: qty 2
  Filled 2017-07-09: qty 1
  Filled 2017-07-09: qty 2

## 2017-07-09 MED ORDER — TORSEMIDE 20 MG PO TABS: 20.0000 mg | ORAL_TABLET | Freq: Every day | ORAL | Status: DC

## 2017-07-09 MED ORDER — METOPROLOL TARTRATE 25 MG PO TABS
25.0000 mg | ORAL_TABLET | Freq: Two times a day (BID) | ORAL | Status: DC
  Administered 2017-07-09 – 2017-07-12 (×7): 25 mg via ORAL
  Filled 2017-07-09 (×7): qty 1

## 2017-07-09 MED ORDER — HYDRALAZINE HCL 50 MG PO TABS
50.0000 mg | ORAL_TABLET | Freq: Three times a day (TID) | ORAL | Status: DC
  Administered 2017-07-09 – 2017-07-12 (×9): 50 mg via ORAL
  Filled 2017-07-09 (×9): qty 1

## 2017-07-09 MED ORDER — TAB-A-VITE/IRON PO TABS
ORAL_TABLET | Freq: Every day | ORAL | Status: DC
  Administered 2017-07-10: 09:00:00 via ORAL
  Administered 2017-07-11 – 2017-07-12 (×2): 1 via ORAL
  Filled 2017-07-09 (×4): qty 1

## 2017-07-09 MED ORDER — BRIMONIDINE TARTRATE 0.15 % OP SOLN
1.0000 [drp] | Freq: Two times a day (BID) | OPHTHALMIC | Status: DC
Start: 2017-07-09 — End: 2017-07-12
  Administered 2017-07-09 – 2017-07-12 (×7): 1 [drp] via OPHTHALMIC
  Filled 2017-07-09: qty 5

## 2017-07-09 MED ORDER — ATORVASTATIN CALCIUM 40 MG PO TABS
40.0000 mg | ORAL_TABLET | Freq: Every day | ORAL | Status: DC
  Administered 2017-07-09 – 2017-07-11 (×3): 40 mg via ORAL
  Filled 2017-07-09 (×3): qty 1

## 2017-07-09 MED ORDER — PANTOPRAZOLE SODIUM 40 MG PO TBEC
40.0000 mg | DELAYED_RELEASE_TABLET | Freq: Every day | ORAL | Status: DC
  Administered 2017-07-09 – 2017-07-12 (×4): 40 mg via ORAL
  Filled 2017-07-09 (×4): qty 1

## 2017-07-09 MED ORDER — DORZOLAMIDE HCL-TIMOLOL MAL 2-0.5 % OP SOLN
1.0000 [drp] | Freq: Two times a day (BID) | OPHTHALMIC | Status: DC
  Administered 2017-07-09 – 2017-07-12 (×7): 1 [drp] via OPHTHALMIC
  Filled 2017-07-09: qty 10

## 2017-07-09 MED ORDER — SODIUM CHLORIDE 0.9 % IV SOLN
500.0000 mg | Freq: Two times a day (BID) | INTRAVENOUS | Status: DC
  Administered 2017-07-09 (×2): 500 mg via INTRAVENOUS
  Filled 2017-07-09 (×3): qty 5

## 2017-07-09 MED ORDER — ASPIRIN EC 81 MG PO TBEC: 81.0000 mg | DELAYED_RELEASE_TABLET | Freq: Every day | ORAL | Status: DC

## 2017-07-09 MED ORDER — LATANOPROST 0.005 % OP SOLN
1.0000 [drp] | Freq: Every day | OPHTHALMIC | Status: DC
  Administered 2017-07-09 – 2017-07-11 (×3): 1 [drp] via OPHTHALMIC
  Filled 2017-07-09: qty 2.5

## 2017-07-09 MED ORDER — FLUTICASONE PROPIONATE 50 MCG/ACT NA SUSP
2.0000 | Freq: Every day | NASAL | Status: DC | PRN
  Filled 2017-07-09: qty 16

## 2017-07-09 NOTE — Progress Notes (Signed)
Inpatient Rehabilitation  Per PT/OT request, patient was screened by Gunnar Fusi for appropriateness for an Inpatient Acute Rehab consult.  At this time we are recommending an Inpatient Rehab consult.  Text paged MD; please order if you are agreeable.    Carmelia Roller., CCC/SLP Admission Coordinator  [place]  Cell [phone]

## 2017-07-09 NOTE — Evaluation (Signed)
Occupational Therapy Evaluation Patient Details Name: Darrell [name] MRN: [id number] DOB: 12-21-1950 Today's Date: 07/09/2017    History of Present Illness 66 y.o.BM PMHxCVA with residual dysphagia, Neuromuscular disorder(chronic inflammatory demyelinating polyneuropathy) HTN,Symptomatic Bradycardia S/P pacemaker 12/2016, HLD, tobacco abuse, and CKD stage 3. Presented by EMS after witnessed seizure,withaltered mental status(postictal).   Clinical Impression   PTA, pt was living with his wife who assisted with ADLs including bathing, dressing, and set up for grooming/feeding. Pt using RW for short distanced at home. Pt currently demonstrating decreased balancing,impulsivity, and slow processing. Pt required Max verbal, visual, and tactile cues throughout session. Pt would benefit from further acute OT to facilitate safe dc. Recommend dc to CIR for intensive therapy to optimize safety and independence with ADLs and functional mobility as well as decrease caregiver burden at home.    Follow Up Recommendations  CIR;Supervision/Assistance - 24 hour    Equipment Recommendations  Other (comment) (Defer to next venue)    Recommendations for Other Services PT consult     Precautions / Restrictions Precautions Precautions: Fall;ICD/Pacemaker Restrictions Weight Bearing Restrictions: No      Mobility Bed Mobility Overal bed mobility: Needs Assistance Bed Mobility: Supine to Sit     Supine to sit: Mod assist;HOB elevated     General bed mobility comments: Mod A to gain balance once seated. Pt able to transition from supine to EOB but demonstrating decreased sitting balance with single LOB back into bed  Transfers Overall transfer level: Needs assistance Equipment used: Rolling walker (2 wheeled) Transfers: Sit to/from [facility] Sit to Stand: Mod assist;+2 safety/equipment;From elevated surface Stand pivot transfers: Max assist;+2 physical assistance;From elevated  surface       General transfer comment: Pt required Mod A +2 for sit<>Stand to power up from elevated surface. Max VCs for hand placement and safety. Pt requiring Max A to stand pivot to recliner and unable to sequence tasks without max cues    Balance Overall balance assessment: Needs assistance Sitting-balance support: No upper extremity supported;Feet supported Sitting balance-Leahy Scale: Poor Sitting balance - Comments: Sinlge LOB at EOB with need for physical A to correct   Standing balance support: Bilateral upper extremity supported;During functional activity Standing balance-Leahy Scale: Poor Standing balance comment: Required UE support and physical A to maintain standing balance                           ADL either performed or assessed with clinical judgement   ADL Overall ADL's : Needs assistance/impaired Eating/Feeding: Minimal assistance;Sitting Eating/Feeding Details (indicate cue type and reason): Supported sitting Grooming: Minimal assistance;Sitting;Moderate assistance Grooming Details (indicate cue type and reason): Min-Mod A for sitting balance at EOB. Pt abel to perform grooming with VCs while in supported sitting Upper Body Bathing: Moderate assistance;Sitting   Lower Body Bathing: Maximal assistance;+2 for physical assistance;Sit to/from stand   Upper Body Dressing : Moderate assistance;Sitting   Lower Body Dressing: Maximal assistance;+2 for physical assistance;Sit to/from stand   Toilet Transfer: Maximal assistance;+2 for physical assistance;Stand-pivot;RW (Simulated to recliner)   Toileting- Clothing Manipulation and Hygiene: Maximal assistance;Sit to/from stand;+2 for physical assistance         General ADL Comments: Pt requiring Mod A for UB ADLs ans Max A for LB ADLs. Pt required +2 for transfer due to poor balance. Pt wife confirming that this is decreased from baseline and pt presenting with poor balance and processing.      Vision  Baseline  Vision/History: Wears glasses (Blind in R eye and partially blind in L) Wears Glasses: At all times Patient Visual Report: No change from baseline;Other (comment) (Blind in R eye and particially blind in L eye) Vision Assessment?: Vision impaired- to be further tested in functional context     Perception     Praxis      Pertinent Vitals/Pain Pain Assessment: No/denies pain     Hand Dominance Right   Extremity/Trunk Assessment Upper Extremity Assessment Upper Extremity Assessment: LUE deficits/detail LUE Deficits / Details: Decreased ROM in shoulder and hand due to prior CVA. Pt able to perform finger flexion adn extention with increased time. Tendency to form flexed position with thumb under index finger while in rest LUE Coordination: decreased fine motor;decreased gross motor   Lower Extremity Assessment Lower Extremity Assessment: Defer to PT evaluation   Cervical / Trunk Assessment Cervical / Trunk Assessment: Kyphotic   Communication Communication Communication: No difficulties   Cognition Arousal/Alertness: Awake/alert Behavior During Therapy: Impulsive Overall Cognitive Status: Impaired/Different from baseline Area of Impairment: Attention;Memory;Following commands;Safety/judgement;Awareness;Problem solving                   Current Attention Level: Sustained Memory: Decreased short-term memory Following Commands: Follows one step commands with increased time;Follows one step commands inconsistently Safety/Judgement: Decreased awareness of safety Awareness: Emergent Problem Solving: Slow processing;Requires verbal cues;Requires tactile cues;Difficulty sequencing General Comments: Pt deonstrating decreased cognition and required verbal, visual, and tactile cues throughout session. Pt with decreased processing to sequence transfer. Also demosntrating impuslvivity and decreased safety awarness. Pt oriented to self, place, time, and situation.    General  Comments  Wife present thorughout session and very involved. Pt wife supportive and providing/confirming PLOF and hoem set up (Last fall over a year ago)    Exercises     Shoulder Instructions      Home Living Family/patient expects to be discharged to:: Private residence Living Arrangements: Spouse/significant other Available Help at Discharge: Family;Available 24 hours/day Type of Home: House Home Access: Ramped entrance     Home Layout: One level     Bathroom Shower/Tub: Teacher, early years/pre: Standard     Home Equipment: Environmental consultant - 2 wheels;Tub bench          Prior Functioning/Environment Level of Independence: Needs assistance  Gait / Transfers Assistance Needed: Uses RW for short distance. Wife providing supervision for safety ADL's / Homemaking Assistance Needed: Wife assisting with ADLs including bathing and dressing. Set up for grooming while seated            OT Problem List: Decreased strength;Decreased range of motion;Decreased activity tolerance;Impaired balance (sitting and/or standing);Decreased cognition;Decreased safety awareness;Decreased knowledge of use of DME or AE;Decreased coordination;Impaired vision/perception      OT Treatment/Interventions: Self-care/ADL training;Therapeutic exercise;Energy conservation;DME and/or AE instruction;Therapeutic activities;Patient/family education    OT Goals(Current goals can be found in the care plan section) Acute Rehab OT Goals Patient Stated Goal: Go home OT Goal Formulation: With patient/family Time For Goal Achievement: 07/23/17 Potential to Achieve Goals: Good ADL Goals Pt Will Perform Grooming: with set-up;with supervision;sitting (unsupported sitting) Pt Will Perform Upper Body Dressing: with min assist;sitting Pt Will Transfer to Toilet: with min assist;bedside commode;ambulating Pt Will Perform Toileting - Clothing Manipulation and hygiene: with min assist;sit to/from stand  OT  Frequency: Min 2X/week   Barriers to D/C:            Co-evaluation  AM-PAC PT "6 Clicks" Daily Activity     Outcome Measure Help from another person eating meals?: A Little Help from another person taking care of personal grooming?: A Little Help from another person toileting, which includes using toliet, bedpan, or urinal?: A Lot Help from another person bathing (including washing, rinsing, drying)?: A Lot Help from another person to put on and taking off regular upper body clothing?: A Lot Help from another person to put on and taking off regular lower body clothing?: A Lot 6 Click Score: 14   End of Session Equipment Utilized During Treatment: Gait belt;Rolling walker Nurse Communication: Mobility status  Activity Tolerance: Patient tolerated treatment well Patient left: in chair;with chair alarm set;with call bell/phone within reach;with family/visitor present  OT Visit Diagnosis: Unsteadiness on feet (R26.81);Other abnormalities of gait and mobility (R26.89);Muscle weakness (generalized) (M62.81);Other symptoms and signs involving cognitive function                Time: 7482-7078 OT Time Calculation (min): 29 min Charges:  OT General Charges $OT Visit: 1 Visit OT Evaluation $OT Eval Moderate Complexity: 1 Mod OT Treatments $Self Care/Home Management : 8-22 mins G-Codes:     Jakayla Schweppe MSOT, OTR/L Acute Rehab Pager: [phone] Office: [place] 07/09/2017, 10:26 AM

## 2017-07-09 NOTE — Telephone Encounter (Signed)
Close Encounter 

## 2017-07-09 NOTE — Progress Notes (Signed)
  Speech Language Pathology Treatment: Dysphagia  Patient Details Name: Darrell [name] MRN: [id number] DOB: 1951-06-20 Today's Date: 07/09/2017 Time: 6270-3500 SLP Time Calculation (min) (ACUTE ONLY): 17 min  Assessment / Plan / Recommendation Clinical Impression  SLP provided skilled observation during lunch meal. Pt needs Mod A during meals, in large part due to visual deficits that impact his ability to seld-feed/-monitor. He gets large amounts of foods on his fork at a time, and when he takes these large bites he ends up with prolonged mastication, incomplete oral clearance, and immediate coughing that is concerning for premature spillage into the pharynx. He does much better when he has someone to assist him in chopping his food into smaller pieces and ensuring he gets smaller amounts on his utensils. He has intermittent throat clearing when consuming thin liquids, which was protective of his airway on last MBS. Although that study was several years ago and he seems to have been tolerating this diet PTA, he does remain at increased risk for aspiration and/or infection if he is overall more deconditioned now. He and his wife are agreeable to repeating MBS. Will continue his current diet and precautions until this can be completed - either this afternoon or tomorrow pending timing of his MRI per discussion with his RN.   HPI HPI: 66 year old history of prior CVA, CK D, dysphagia, GERD, HLD, HTN presents with new onset seizures. Treated with Versed and then loaded with Depakote in ER. ER M.D. discuss case with neurology who recommends admission to medicine for EEG in a.m. recommended initially MRI unable to obtain as patient does have history of pacemaker.  CT head unremarkable.        SLP Plan  MBS       Recommendations  Diet recommendations: Dysphagia 2 (fine chop);Thin liquid Liquids provided via: Cup;Straw Medication Administration: Crushed with puree Supervision: Staff to assist with  self feeding;Full supervision/cueing for compensatory strategies Compensations: Slow rate;Small sips/bites;Lingual sweep for clearance of pocketing Postural Changes and/or Swallow Maneuvers: Seated upright 90 degrees                Oral Care Recommendations: Oral care BID Follow up Recommendations:  (tba) SLP Visit Diagnosis: Dysphagia, oropharyngeal phase (R13.12) Plan: MBS       GO                [name] 07/09/2017, 2:03 PM  [name], M.A. CCC-SLP [phone]

## 2017-07-09 NOTE — Consult Note (Signed)
Physical Medicine and Rehabilitation Consult Reason for Consult: Decreased functional mobility Referring Physician: [facility]   HPI: Darrell [name] is a 66 y.o. right handed male with history of hypertension, CVA with residual left-sided weakness and dysphagia maintained on aspirin, neuromuscular disorder with chronic inflammatory demyelinating polyneuropathy, bradycardia with pacemaker April 2018, tobacco abuse, CKD stage III. Per chart review, patient lives with spouse. Used a rolling walker for short distance ambulation. Wife assists with ADLs. One level home with ramped entrance. Presented 07/08/2017 with new onset of seizure and altered mental status. Cranial CT scan reviewed, negative for acute process.  EEG showed evidence of mild generalized slowing of brain activity consistent with probable post ictal state. Patient was loaded with Keppra. MRI of the brain showed no acute ischemia or significant mass effect. Repeat MRI brain, limited, but per report unremarkable for acute process. An MRI of cervical spine due to hyperreflexia and spasticity to be completed with results pending. Physical therapy evaluation completed with recommendations of physical medicine rehabilitation consult.   Review of Systems  Constitutional: Negative for chills and fever.  HENT: Negative for hearing loss.   Eyes: Negative for blurred vision and double vision.  Respiratory: Negative for cough.   Cardiovascular: Positive for leg swelling. Negative for chest pain and palpitations.  Gastrointestinal: Positive for constipation. Negative for nausea and vomiting.       GERD  Genitourinary: Positive for urgency. Negative for dysuria, flank pain and hematuria.  Musculoskeletal: Positive for joint pain.  Skin: Negative for rash.  Neurological: Positive for sensory change, seizures and weakness.  All other systems reviewed and are negative.  Past Medical History:  Diagnosis Date  . Chronic kidney disease    stage  3 GFR 30-59 ml/min   . Dysphagia as late effect of cerebrovascular disease    pts wife states pt has to eat soft foods   . GERD (gastroesophageal reflux disease)   . Glaucoma   . High cholesterol   . Hypertension   . Neuromuscular disorder ([place])    chronic inflammatory demyelinating polyneuropathy   . Stroke [facility])    2011 with residual deficit left sided weakness  . Tobacco dependence    Past Surgical History:  Procedure Laterality Date  . EYE SURGERY    . INGUINAL HERNIA REPAIR Right 11/23/2014   Procedure: right inguinal hernia repair with mesh;  Surgeon: Armandina Gemma, MD;  Location: [facility];  Service: General;  Laterality: Right;  . INSERTION OF MESH N/A 11/23/2014   Procedure: INSERTION OF MESH;  Surgeon: Armandina Gemma, MD;  Location: [facility];  Service: General;  Laterality: N/A;  . PACEMAKER IMPLANT N/A 01/16/2017   Procedure: Pacemaker Implant;  Surgeon: Will Meredith Leeds, MD;  Location: [place] [facility] LAB;  Service: Cardiovascular;  Laterality: N/A;  . SHOULDER SURGERY Bilateral 1988, 1998   Family History  Problem Relation Age of Onset  . Hypertension Mother   . Diabetes Sister   . Hypertension Sister    Social History:  reports that he has been smoking Cigarettes.  He has a 40.00 pack-year smoking history. He has never used smokeless tobacco. He reports that he does not drink alcohol or use drugs. Allergies:  Allergies  Allergen Reactions  . Doxycycline Other (See Comments)    Hiccups, cough, nausea and emesis, elevated liver enzymes, elevated eosinophils, SOB concerning for early DRESS syndrome   . Atacand Hct [Candesartan Cilexetil-Hctz] Hives  . Shellfish Allergy Hives   Medications Prior to Admission  Medication Sig Dispense Refill  . amLODipine (NORVASC) 10 MG tablet Take 1 tablet (10 mg total) by mouth every morning. 90 tablet 0  . aspirin EC 81 MG tablet Take 1 tablet (81 mg total) by mouth daily. 90 tablet 3  . atorvastatin (LIPITOR) 40 MG tablet Take 1 tablet  (40 mg total) by mouth daily. 90 tablet 0  . brimonidine (ALPHAGAN P) 0.1 % SOLN Place 1 drop into both eyes 2 (two) times daily.    . dorzolamide-timolol (COSOPT) 22.3-6.8 MG/ML ophthalmic solution Place 1 drop into both eyes 2 (two) times daily.    Marland Kitchen esomeprazole (NEXIUM) 20 MG capsule Take 20 mg by mouth daily at 12 noon.    . fluticasone (FLONASE) 50 MCG/ACT nasal spray Place 2 sprays into both nostrils daily. (Patient taking differently: Place 2 sprays into both nostrils daily as needed for allergies. ) 16 g 6  . hydrALAZINE (APRESOLINE) 50 MG tablet Take 1 tablet (50 mg total) by mouth 3 (three) times daily. 270 tablet 0  . ketoconazole (NIZORAL) 2 % cream Apply 1 application topically daily as needed for irritation.    Marland Kitchen latanoprost (XALATAN) 0.005 % ophthalmic solution Place 1 drop into both eyes at bedtime.    Marland Kitchen lisinopril (PRINIVIL,ZESTRIL) 20 MG tablet Take 1 tablet (20 mg total) by mouth daily. 90 tablet 0  . metoprolol tartrate (LOPRESSOR) 25 MG tablet Take 1 tablet (25 mg total) by mouth 2 (two) times daily. 180 tablet 3  . Multiple Vitamins-Iron (DAILY VITAMIN FORMULA+IRON) TABS Take 1 tablet by mouth daily.    . Psyllium (EQ DAILY FIBER PO) Take 1 tablet by mouth daily.    Marland Kitchen terbinafine (LAMISIL AT) 1 % cream Apply 1 application topically 2 (two) times daily. To R foot (Patient taking differently: Apply 1 application topically 2 (two) times daily as needed (itching). To R foot) 30 g 0  . torsemide (DEMADEX) 20 MG tablet Take 20 mg by mouth daily.      Home: Home Living Family/patient expects to be discharged to:: Private residence Living Arrangements: Spouse/significant other Available Help at Discharge: Family, Available 24 hours/day Type of Home: House Home Access: [place]: One level Bathroom Shower/Tub: Tub/shower unit [place]: Environmental consultant - 2 wheels, Tub bench  Functional History: Prior Function Level of Independence:  Needs assistance Gait / Transfers Assistance Needed: Uses RW for short distance. Wife providing supervision for safety ADL's / Homemaking Assistance Needed: Wife assisting with ADLs including bathing and dressing. Set up for grooming while seated Functional Status:  Mobility: Bed Mobility Overal bed mobility: Needs Assistance Bed Mobility: Supine to Sit Supine to sit: Mod assist, HOB elevated General bed mobility comments: Pt sitting up in recliner upon PT arrival.  Transfers Overall transfer level: Needs assistance Equipment used: Rolling walker (2 wheeled) Transfers: Sit to/from Stand Sit to Stand: Mod assist, +2 safety/equipment, From elevated surface Stand pivot transfers: Max assist, +2 physical assistance, From elevated surface General transfer comment: Pt required Mod A +2 for sit<>Stand to power up from elevated surface. Max VCs for hand placement and safety. Pt requiring Max A to stand pivot to recliner and unable to sequence tasks without max cues Ambulation/Gait Ambulation/Gait assistance: Mod assist, +2 safety/equipment Ambulation Distance (Feet): 6 Feet Assistive device: Rolling walker (2 wheeled) Gait Pattern/deviations: Step-through pattern, Decreased stride length, Trunk flexed, Ataxic General Gait Details: Ataxic and with some motor apraxia noted. Pt was able to ambualte a short distance in room with chair  follow for safety. Mod assist for balance support and safety with mostly +2 assist for chair follow.  Gait velocity: Decreased Gait velocity interpretation: Below normal speed for age/gender    ADL: ADL Overall ADL's : Needs assistance/impaired Eating/Feeding: Minimal assistance, Sitting Eating/Feeding Details (indicate cue type and reason): Supported sitting Grooming: Minimal assistance, Sitting, Moderate assistance Grooming Details (indicate cue type and reason): Min-Mod A for sitting balance at EOB. Pt abel to perform grooming with VCs while in supported  sitting Upper Body Bathing: Moderate assistance, Sitting Lower Body Bathing: Maximal assistance, +2 for physical assistance, Sit to/from stand Upper Body Dressing : Moderate assistance, Sitting Lower Body Dressing: Maximal assistance, +2 for physical assistance, Sit to/from stand Toilet Transfer: Maximal assistance, +2 for physical assistance, Stand-pivot, RW (Simulated to recliner) Toileting- Clothing Manipulation and Hygiene: Maximal assistance, Sit to/from stand, +2 for physical assistance General ADL Comments: Pt requiring Mod A for UB ADLs ans Max A for LB ADLs. Pt required +2 for transfer due to poor balance. Pt wife confirming that this is decreased from baseline and pt presenting with poor balance and processing.   Cognition: Cognition Overall Cognitive Status: Impaired/Different from baseline Orientation Level: Oriented X4 Cognition Arousal/Alertness: Awake/alert Behavior During Therapy: Impulsive Overall Cognitive Status: Impaired/Different from baseline Area of Impairment: Attention, Memory, Following commands, Safety/judgement, Awareness, Problem solving Current Attention Level: Sustained Memory: Decreased short-term memory Following Commands: Follows one step commands with increased time, Follows one step commands inconsistently Safety/Judgement: Decreased awareness of safety Awareness: Emergent Problem Solving: Slow processing, Requires verbal cues, Requires tactile cues, Difficulty sequencing General Comments: Pt deonstrating decreased cognition and required verbal, visual, and tactile cues throughout session. Pt with decreased processing to sequence transfer. Also demosntrating impulsivity and decreased safety awareness. Pt oriented to self, place, time, and situation.   Blood pressure (!) 159/91, pulse 76, temperature 98.4 F (36.9 C), temperature source Oral, resp. rate 20, height 6\' 4"  (1.93 m), weight 81.6 kg (180 lb), SpO2 95 %. Physical Exam  Vitals  reviewed. Constitutional: He appears well-developed and well-nourished.  66 year old right-handed male  HENT:  Head: Normocephalic and atraumatic.  Eyes: EOM are normal. Right eye exhibits no discharge. Left eye exhibits no discharge.  Pupils reactive to light  Neck: Normal range of motion. Neck supple. No thyromegaly present.  Cardiovascular: Normal rate, regular rhythm and normal heart sounds.   Respiratory: Effort normal and breath sounds normal. No respiratory distress.  GI: Soft. Bowel sounds are normal. He exhibits no distension.  Musculoskeletal: He exhibits no edema or tenderness.  Neurological: He is alert.  He did provide his name and follows simple commands. Alert and oriented to person and place only Motor: 4+/5 throughout (right stronger than left) DTRs hyperreflexic, but symmetric  Skin: Skin is warm and dry.  Psychiatric: His affect is blunt. His speech is delayed. He is slowed.    Results for orders placed or performed during the hospital encounter of 07/07/17 (from the past 24 hour(s))  Comprehensive metabolic panel     Status: Abnormal   Collection Time: 07/09/17  5:29 AM  Result Value Ref Range   Sodium 138 135 - 145 mmol/L   Potassium 4.1 3.5 - 5.1 mmol/L   Chloride 108 101 - 111 mmol/L   CO2 24 22 - 32 mmol/L   Glucose, Bld 84 65 - 99 mg/dL   BUN 17 6 - 20 mg/dL   Creatinine, Ser 1.92 (H) 0.61 - 1.24 mg/dL   Calcium 9.2 8.9 - 10.3 mg/dL   Total  Protein 5.5 (L) 6.5 - 8.1 g/dL   Albumin 2.9 (L) 3.5 - 5.0 g/dL   AST 13 (L) 15 - 41 U/L   ALT 8 (L) 17 - 63 U/L   Alkaline Phosphatase 81 38 - 126 U/L   Total Bilirubin 0.7 0.3 - 1.2 mg/dL   GFR calc non Af Amer 35 (L) >60 mL/min   GFR calc Af Amer 41 (L) >60 mL/min   Anion gap 6 5 - 15  Magnesium     Status: Abnormal   Collection Time: 07/09/17  5:29 AM  Result Value Ref Range   Magnesium 1.6 (L) 1.7 - 2.4 mg/dL   Ct Head Wo Contrast  Result Date: 07/07/2017 CLINICAL DATA:  Acute onset of seizure.   Initial encounter. EXAM: CT HEAD WITHOUT CONTRAST TECHNIQUE: Contiguous axial images were obtained from the base of the skull through the vertex without intravenous contrast. COMPARISON:  CT of the head performed 12/06/2016 FINDINGS: Brain: No evidence of acute infarction, hemorrhage, hydrocephalus, extra-axial collection or mass lesion/mass effect. Prominence of the ventricles and sulci reflects mild cortical volume loss. A chronic infarct noted at the right occipital lobe, with associated encephalomalacia. Diffuse periventricular and subcortical white matter likely reflects small vessel ischemic microangiopathy. Chronic lacunar infarcts are noted at the right basal ganglia and bilateral thalami. Cerebellar atrophy is noted. The brainstem and fourth ventricle are within normal limits. No mass effect or midline shift is seen. Vascular: No hyperdense vessel or unexpected calcification. Skull: There is no evidence of fracture; visualized osseous structures are unremarkable in appearance. Sinuses/Orbits: The orbits are within normal limits. A mucus retention cyst or polyp is noted at the left maxillary sinus. The remaining paranasal sinuses and mastoid air cells are well-aerated. Other: No significant soft tissue abnormalities are seen. IMPRESSION: 1. No acute intracranial pathology seen on CT. 2. Mild cortical volume loss and diffuse small vessel ischemic microangiopathy. 3. Chronic infarct at the right occipital lobe, with associated encephalomalacia. Chronic lacunar infarcts at the right basal ganglia and bilateral thalami. 4. Mucus retention cyst or polyp at the left maxillary sinus. Electronically Signed   By: Garald Balding M.D.   On: 07/07/2017 21:49   Dg Chest Port 1 View  Result Date: 07/07/2017 CLINICAL DATA:  Initial evaluation for acute seizure. EXAM: PORTABLE CHEST 1 VIEW COMPARISON:  Prior radiograph from 01/17/2017. FINDINGS: Patient is rotated to the left. Cardiomegaly is unchanged. Mediastinal  silhouette unchanged allowing for patient rotation. Aortic atherosclerosis. Left-sided Almyra Free transvenous pacemaker/ AICD noted. Defibrillator pad overlies left chest. Lungs hypoinflated. Mild perihilar vascular congestion without overt pulmonary edema. No focal infiltrates. No significant pleural effusion. No pneumothorax. No acute osseus abnormality.  Osteopenia noted. IMPRESSION: 1. Stable cardiomegaly with mild perihilar vascular congestion without pulmonary edema. 2. No other active cardiopulmonary disease. Electronically Signed   By: Jeannine Boga M.D.   On: 07/07/2017 21:15    Assessment/Plan: Diagnosis: Debility Labs and images independently reviewed.  Records reviewed and summated above.  1. Does the need for close, 24 hr/day medical supervision in concert with the patient's rehab needs make it unreasonable for this patient to be served in a less intensive setting? Potentially 2. Co-Morbidities requiring supervision/potential complications: HTN (monitor and provide prns in accordance with increased physical exertion and pain), CVA with residual left-sided weakness and dysphagia (cont meds), neuromuscular disorder with chronic inflammatory demyelinating polyneuropathy, bradycardia (s/p pacemaker), tobacco abuse (counsel when appropriate), CKD stage III (avoid nephrotoxic meds), seizure (cont meds), ABLA (transfuse if necessary to ensure appropriate  perfusion for increased activity tolerance) 3. Due to safety, disease management, medication administration and patient education, does the patient require 24 hr/day rehab nursing? Potentially 4. Does the patient require coordinated care of a physician, rehab nurse, PT (1-2 hrs/day, 5 days/week), OT (1-2 hrs/day, 5 days/week) and SLP (1-2 hrs/day, 5 days/week) to address physical and functional deficits in the context of the above medical diagnosis(es)? Potentially Addressing deficits in the following areas: balance, endurance, locomotion,  strength, transferring, bathing, dressing, feeding, cognition and psychosocial support 5. Can the patient actively participate in an intensive therapy program of at least 3 hrs of therapy per day at least 5 days per week? Yes 6. The potential for patient to make measurable gains while on inpatient rehab is excellent 7. Anticipated functional outcomes upon discharge from inpatient rehab are supervision  with PT, supervision with OT, supervision and min assist with SLP. 8. Estimated rehab length of stay to reach the above functional goals is: 15-19 days. 9. Anticipated D/C setting: Home 10. Anticipated post D/C treatments: HH therapy and Home excercise program 11. Overall Rehab/Functional Prognosis: good  RECOMMENDATIONS: This patient's condition is appropriate for continued rehabilitative care in the following setting: Will await completion of medical workup. If ultimate diagnosis is seizures, pt should improve to baseline with time.  If deficits persist, will consider CIR. Patient has agreed to participate in recommended program. Potentially Note that insurance prior authorization may be required for reimbursement for recommended care.  Comment: Rehab Admissions Coordinator to follow up.  Delice Lesch, MD, ABPMR Lauraine Rinne J., PA-C 07/09/2017

## 2017-07-09 NOTE — Progress Notes (Signed)
Received phone call from [facility] in MRI stating that patient was almost ready to go into MRI machine when code stroke patient needed to be seen first which made pt have to wait longer to get MRI. Brooke stated patient kept pulling at wires and repeatedly had to talk to patient to tell him to stop pulling at his wires four times. Jerene Pitch stated that during the fourth time telling patient to stop pulling at wires that patient got agitated and swung and hit her. (Patient denies that he struck Brooke) Brooke asked if anything could be given to help calm patient. RN called Rai, MD who stated that patient may receive 1mg  of Ativan to help with MRI and to calm patient and if patient not calm after 15 minutes, patient may receive one more mg equaling 2mg  total if necessary. RN informed pt of medicine and administered in MRI. Will continue to follow.

## 2017-07-09 NOTE — Evaluation (Signed)
Physical Therapy Evaluation Patient Details Name: Darrell [name] MRN: [id number] DOB: 05-06-1951 Today's Date: 07/09/2017   History of Present Illness  Pt is a 66 y/o male with a PMH significant forCVA with residual dysphagia, Neuromuscular disorder(chronic inflammatory demyelinating polyneuropathy) HTN,Symptomatic Bradycardia S/P pacemaker 12/2016, HLD, tobacco abuse, and CKD stage 3. Presented by EMS after witnessed seizure,withaltered mental status(postictal).  Clinical Impression  Pt admitted with above diagnosis. Pt currently with functional limitations due to the deficits listed below (see PT Problem List). At the time of PT eval pt was able to perform transfers and ambulation with +2 mod assist for balance support and safety. Pt demonstrating decreased cognition, with noted ataxia and motor apraxia at times with basic mobility. Per wife, pt with some residual deficits from prior CVA, however was managing well at home with wife's assist for ADL's, and RW for mobility. Wife states he is "much worse" currently than his PLOF. Feel this patient would benefit from continued therapies at the CIR level to maximize functional independence and return to PLOF, as well as to decrease burden of care on wife. Also discussed the option of SNF with pt and wife, however do feel he would thrive in the CIR environment vs the slower pace of SNF. Acutely, pt will benefit from skilled PT to increase their independence and safety with mobility to allow discharge to the venue listed below.       Follow Up Recommendations CIR;Supervision/Assistance - 24 hour    Equipment Recommendations  None recommended by PT    Recommendations for Other Services       Precautions / Restrictions Precautions Precautions: Fall;ICD/Pacemaker Restrictions Weight Bearing Restrictions: No      Mobility  Bed Mobility Overal bed mobility: Needs Assistance Bed Mobility: Supine to Sit     Supine to sit: Mod assist;HOB  elevated     General bed mobility comments: Pt sitting up in recliner upon PT arrival.   Transfers Overall transfer level: Needs assistance Equipment used: Rolling walker (2 wheeled) Transfers: Sit to/from Stand Sit to Stand: Mod assist;+2 safety/equipment;From elevated surface Stand pivot transfers: Max assist;+2 physical assistance;From elevated surface       General transfer comment: Pt required Mod A +2 for sit<>Stand to power up from elevated surface. Max VCs for hand placement and safety. Pt requiring Max A to stand pivot to recliner and unable to sequence tasks without max cues  Ambulation/Gait Ambulation/Gait assistance: Mod assist;+2 safety/equipment Ambulation Distance (Feet): 6 Feet Assistive device: Rolling walker (2 wheeled) Gait Pattern/deviations: Step-through pattern;Decreased stride length;Trunk flexed;Ataxic Gait velocity: Decreased Gait velocity interpretation: Below normal speed for age/gender General Gait Details: Ataxic and with some motor apraxia noted. Pt was able to ambualte a short distance in room with chair follow for safety. Mod assist for balance support and safety with mostly +2 assist for chair follow.   Stairs            Wheelchair Mobility    Modified Rankin (Stroke Patients Only) Modified Rankin (Stroke Patients Only) Pre-Morbid Rankin Score: Moderate disability Modified Rankin: Moderately severe disability     Balance Overall balance assessment: Needs assistance Sitting-balance support: No upper extremity supported;Feet supported Sitting balance-Leahy Scale: Poor Sitting balance - Comments: Sinlge LOB at EOB with need for physical A to correct   Standing balance support: Bilateral upper extremity supported;During functional activity Standing balance-Leahy Scale: Poor Standing balance comment: Required UE support and physical A to maintain standing balance  Pertinent Vitals/Pain Pain  Assessment: No/denies pain    Home Living Family/patient expects to be discharged to:: Private residence Living Arrangements: Spouse/significant other Available Help at Discharge: Family;Available 24 hours/day Type of Home: House Home Access: Ramped entrance     Home Layout: One level Home Equipment: Walker - 2 wheels;Tub bench      Prior Function Level of Independence: Needs assistance   Gait / Transfers Assistance Needed: Uses RW for short distance. Wife providing supervision for safety  ADL's / Homemaking Assistance Needed: Wife assisting with ADLs including bathing and dressing. Set up for grooming while seated        Hand Dominance   Dominant Hand: Right    Extremity/Trunk Assessment   Upper Extremity Assessment Upper Extremity Assessment: LUE deficits/detail;Defer to OT evaluation LUE Deficits / Details: Residual deficits from prior stroke LUE Coordination: decreased fine motor;decreased gross motor    Lower Extremity Assessment Lower Extremity Assessment: LLE deficits/detail LLE Deficits / Details: Decreased strength and AROM consistent with prior stroke. Pt reports N/T in dorsal aspect of R lateral foot.     Cervical / Trunk Assessment Cervical / Trunk Assessment: Kyphotic  Communication   Communication: No difficulties  Cognition Arousal/Alertness: Awake/alert Behavior During Therapy: Impulsive Overall Cognitive Status: Impaired/Different from baseline Area of Impairment: Attention;Memory;Following commands;Safety/judgement;Awareness;Problem solving                   Current Attention Level: Sustained Memory: Decreased short-term memory Following Commands: Follows one step commands with increased time;Follows one step commands inconsistently Safety/Judgement: Decreased awareness of safety Awareness: Emergent Problem Solving: Slow processing;Requires verbal cues;Requires tactile cues;Difficulty sequencing General Comments: Pt deonstrating  decreased cognition and required verbal, visual, and tactile cues throughout session. Pt with decreased processing to sequence transfer. Also demosntrating impulsivity and decreased safety awareness. Pt oriented to self, place, time, and situation.       General Comments General comments (skin integrity, edema, etc.): Wife present thorughout session and very involved. Pt wife supportive and providing/confirming PLOF and hoem set up (Last fall over a year ago)    Exercises     Assessment/Plan    PT Assessment Patient needs continued PT services  PT Problem List Decreased strength;Decreased range of motion;Decreased activity tolerance;Decreased balance;Decreased mobility;Decreased cognition;Decreased knowledge of use of DME;Decreased coordination;Decreased safety awareness;Decreased knowledge of precautions       PT Treatment Interventions DME instruction;Gait training;Stair training;Functional mobility training;Therapeutic activities;Therapeutic exercise;Neuromuscular re-education;Patient/family education    PT Goals (Current goals can be found in the Care Plan section)  Acute Rehab PT Goals Patient Stated Goal: Go home PT Goal Formulation: With patient/family Time For Goal Achievement: 07/23/17 Potential to Achieve Goals: Good    Frequency Min 3X/week   Barriers to discharge        Co-evaluation               AM-PAC PT "6 Clicks" Daily Activity  Outcome Measure Difficulty turning over in bed (including adjusting bedclothes, sheets and blankets)?: Unable Difficulty moving from lying on back to sitting on the side of the bed? : Unable Difficulty sitting down on and standing up from a chair with arms (e.g., wheelchair, bedside commode, etc,.)?: Unable Help needed moving to and from a bed to chair (including a wheelchair)?: A Lot Help needed walking in hospital room?: A Lot Help needed climbing 3-5 steps with a railing? : A Lot 6 Click Score: 9    End of Session  Equipment Utilized During Treatment: Gait belt Activity Tolerance: Patient tolerated  treatment well Patient left: in chair;with call bell/phone within reach;with chair alarm set;with family/visitor present;with nursing/sitter in room Nurse Communication: Mobility status (Present in room during mobility) PT Visit Diagnosis: Other symptoms and signs involving the nervous system (R29.898);Difficulty in walking, not elsewhere classified (R26.2);Apraxia (R48.2);Unsteadiness on feet (R26.81)    Time: 8979-1504 PT Time Calculation (min) (ACUTE ONLY): 22 min   Charges:   PT Evaluation $PT Eval Moderate Complexity: 1 Mod     PT G Codes:        Rolinda Roan, PT, DPT Acute Rehabilitation Services Pager: [phone]   Thelma Comp 07/09/2017, 1:31 PM

## 2017-07-09 NOTE — Consult Note (Addendum)
NEURO HOSPITALIST CONSULT NOTE   Requestig physician: Dr. Tana Coast  Reason for Consult: New onset seizure  History obtained from:  Patient   Chart    HPI:                                                                                                                                          Darrell [name] is an 66 y.o. male with known history of stroke in the past and CIDP in the past and EMG findings consistent with this.  Patient has residual dysphasia from the stroke. Vision also has symptomatic bradycardia status post pacemaker 12/2016 (MRI safe). Per chart and EMS arrived and witnessed a seizure with altered mental status postictally.  In route patient continued to have seizure-like activity.  Patient has no history of previous seizure however he does have a history of stroke.  Per chart patient uses a walker of ambulation.  EEG was obtained while patient was in the hospital which showed no nonconvulsive seizures or seizure activity.  At baseline he has poor vision and is very scared of falling. When walking he has shuffled gait per wife.   Past Medical History:  Diagnosis Date  . Chronic kidney disease    stage 3 GFR 30-59 ml/min   . Dysphagia as late effect of cerebrovascular disease    pts wife states pt has to eat soft foods   . GERD (gastroesophageal reflux disease)   . Glaucoma   . High cholesterol   . Hypertension   . Neuromuscular disorder ([place])    chronic inflammatory demyelinating polyneuropathy   . Stroke [facility])    2011 with residual deficit left sided weakness  . Tobacco dependence     Past Surgical History:  Procedure Laterality Date  . EYE SURGERY    . INGUINAL HERNIA REPAIR Right 11/23/2014   Procedure: right inguinal hernia repair with mesh;  Surgeon: Armandina Gemma, MD;  Location: [facility];  Service: General;  Laterality: Right;  . INSERTION OF MESH N/A 11/23/2014   Procedure: INSERTION OF MESH;  Surgeon: Armandina Gemma, MD;  Location: [facility];   Service: General;  Laterality: N/A;  . PACEMAKER IMPLANT N/A 01/16/2017   Procedure: Pacemaker Implant;  Surgeon: Will Meredith Leeds, MD;  Location: [facility] [facility];  Service: Cardiovascular;  Laterality: N/A;  . SHOULDER SURGERY Bilateral 1988, 1998    Family History  Problem Relation Age of Onset  . Hypertension Mother   . Diabetes Sister   . Hypertension Sister     Social History:  reports that he has been smoking Cigarettes.  He has a 40.00 pack-year smoking history. He has never used smokeless tobacco. He reports that he does not drink alcohol or use drugs.  Allergies  Allergen Reactions  . Doxycycline  Other (See Comments)    Hiccups, cough, nausea and emesis, elevated liver enzymes, elevated eosinophils, SOB concerning for early DRESS syndrome   . Atacand Hct [Candesartan Cilexetil-Hctz] Hives  . Shellfish Allergy Hives    MEDICATIONS:                                                                                                                     Scheduled: . acetaminophen  1,000 mg Oral On Call to OR  . aspirin EC  81 mg Oral Daily  . folic acid  1 mg Intravenous Daily  . heparin  5,000 Units Subcutaneous Q8H  . midazolam  4 mg Intravenous Once  . thiamine  100 mg Intravenous Daily   ROS:                                                                                                                                       History obtained from the patient  General ROS: negative for - chills, fatigue, fever, night sweats, weight gain or weight loss Psychological ROS: negative for - behavioral disorder, hallucinations, memory difficulties, mood swings or suicidal ideation Ophthalmic ROS: positive for -poor vision ENT ROS: negative for - epistaxis, nasal discharge, oral lesions, sore throat, tinnitus or vertigo Allergy and Immunology ROS: negative for - hives or itchy/watery eyes Hematological and Lymphatic ROS: negative for - bleeding problems, bruising or swollen  lymph nodes Endocrine ROS: negative for - galactorrhea, hair pattern changes, polydipsia/polyuria or temperature intolerance Respiratory ROS: negative for - cough, hemoptysis, shortness of breath or wheezing Cardiovascular ROS: negative for - chest pain, dyspnea on exertion, edema or irregular heartbeat Gastrointestinal ROS: negative for - abdominal pain, diarrhea, hematemesis, nausea/vomiting or stool incontinence Genito-Urinary ROS: negative for - dysuria, hematuria, incontinence or urinary frequency/urgency Musculoskeletal ROS: negative for - joint swelling or muscular weakness Neurological ROS: as noted in HPI Dermatological ROS: negative for rash and skin lesion changes  Blood pressure (!) 159/91, pulse 76, temperature 98.4 F (36.9 C), temperature source Oral, resp. rate 20, height 6\' 4"  (1.93 m), weight 81.6 kg (180 lb), SpO2 95 %.  Neurologic Examination:  HEENT-  Normocephalic, no lesions, without obvious abnormality.  Normal external eye and conjunctiva.  Normal TM's bilaterally.  Normal auditory canals and external ears. Normal external nose, mucus membranes and septum.  Normal pharynx. Cardiovascular- S1, S2 normal, pulses palpable throughout   Lungs- chest clear, no wheezing, rales, normal symmetric air entry Abdomen- normal findings: bowel sounds normal Extremities- no edema Lymph-no adenopathy palpable Musculoskeletal-no joint tenderness, deformity or swelling Skin-warm and dry, no hyperpigmentation, vitiligo, or suspicious lesions  Neurological Examination Mental Status: Alert, oriented, thought content appropriate.  Speech fluent without evidence of aphasia.  Able to follow 3 step commands without difficulty. Cranial Nerves: II: Discs flat bilaterally; Visual fields grossly decreased with blind right eye and only central vision in left eye,  III,IV, VI: ptosis not present,  extra-ocular motions intact bilaterally but saccadic persuit  pupils equal, round, reactive to light and accommodation V,VII: smile symmetric, facial light touch sensation normal bilaterally VIII: hearing normal bilaterally IX,X: uvula rises symmetrically XI: bilateral shoulder shrug XII: midline tongue extension Motor: Right : Upper extremity   5/5    Left:     Upper extremity   4/5  Lower extremity   5/5     Lower extremity   5/5 Tone and bulk:normal tone throughout; no atrophy noted Sensory: Pinprick and light touch intact throughout, bilaterally Deep Tendon Reflexes: 3+ and symmetric throughout with clonus at the knees Plantars: Right: downgoing   Left: downgoing Cerebellar: normal finger-to-nose,  and normal heel-to-shin test Gait: short shuffled gait   Lab Results: Basic Metabolic Panel:  Recent Labs Lab 07/07/17 2100 07/08/17 1328 07/09/17 0529  NA 136 136 138  K 4.4 4.3 4.1  CL 104 108 108  CO2 20* 22 24  GLUCOSE 101* 162* 84  BUN 17 18 17   CREATININE 1.85* 2.07* 1.92*  CALCIUM 9.7 9.2 9.2  MG 2.0 1.8 1.6*  PHOS  --  2.6  --     Liver Function Tests:  Recent Labs Lab 07/07/17 2100 07/09/17 0529  AST 18 13*  ALT 6* 8*  ALKPHOS 95 81  BILITOT 0.4 0.7  PROT 6.5 5.5*  ALBUMIN 3.4* 2.9*   CBC:  Recent Labs Lab 07/07/17 2100 07/08/17 1328  WBC 9.8 9.2  NEUTROABS 5.1  --   HGB 12.2* 11.1*  HCT 38.5* 34.5*  MCV 90.2 89.6  PLT 275 230   Imaging: Ct Head Wo Contrast  Result Date: 07/07/2017 CLINICAL DATA:  Acute onset of seizure.  Initial encounter. EXAM: CT HEAD WITHOUT CONTRAST TECHNIQUE: Contiguous axial images were obtained from the base of the skull through the vertex without intravenous contrast. COMPARISON:  CT of the head performed 12/06/2016 FINDINGS: Brain: No evidence of acute infarction, hemorrhage, hydrocephalus, extra-axial collection or mass lesion/mass effect. Prominence of the ventricles and sulci reflects mild cortical volume loss. A  chronic infarct noted at the right occipital lobe, with associated encephalomalacia. Diffuse periventricular and subcortical white matter likely reflects small vessel ischemic microangiopathy. Chronic lacunar infarcts are noted at the right basal ganglia and bilateral thalami. Cerebellar atrophy is noted. The brainstem and fourth ventricle are within normal limits. No mass effect or midline shift is seen. Vascular: No hyperdense vessel or unexpected calcification. Skull: There is no evidence of fracture; visualized osseous structures are unremarkable in appearance. Sinuses/Orbits: The orbits are within normal limits. A mucus retention cyst or polyp is noted at the left maxillary sinus. The remaining paranasal sinuses and mastoid air cells are well-aerated. Other: No significant soft tissue abnormalities are  seen. IMPRESSION: 1. No acute intracranial pathology seen on CT. 2. Mild cortical volume loss and diffuse small vessel ischemic microangiopathy. 3. Chronic infarct at the right occipital lobe, with associated encephalomalacia. Chronic lacunar infarcts at the right basal ganglia and bilateral thalami. 4. Mucus retention cyst or polyp at the left maxillary sinus. Electronically Signed   By: Garald Balding M.D.   On: 07/07/2017 21:49   Dg Chest Port 1 View  Result Date: 07/07/2017 CLINICAL DATA:  Initial evaluation for acute seizure. EXAM: PORTABLE CHEST 1 VIEW COMPARISON:  Prior radiograph from 01/17/2017. FINDINGS: Patient is rotated to the left. Cardiomegaly is unchanged. Mediastinal silhouette unchanged allowing for patient rotation. Aortic atherosclerosis. Left-sided [place] Free transvenous pacemaker/ AICD noted. Defibrillator pad overlies left chest. Lungs hypoinflated. Mild perihilar vascular congestion without overt pulmonary edema. No focal infiltrates. No significant pleural effusion. No pneumothorax. No acute osseus abnormality.  Osteopenia noted. IMPRESSION: 1. Stable cardiomegaly with mild perihilar  vascular congestion without pulmonary edema. 2. No other active cardiopulmonary disease. Electronically Signed   By: Jeannine Boga M.D.   On: 07/07/2017 21:15    Assessment and plan per attending neurologist  Etta Quill PA-C [facility] [phone]  07/09/2017, 10:16 AM  Attending addendum I have seen and examined the patient. I have reviewed the note above and made the edits as necessary. I agree with the history and physical documented above. Please see my assessment and plan below.  Assessment/Plan: New onset seizure in setting of known history of CVA.  At this time he will benefit from AED.  Will start on Keppra but on renal dose only.   Recommend: 1) Keppra 500 mg BID 2) MRI brain and MRI cervical spine due to his hyperreflexia and spasticity throughout (I have personally checked with the MRI technologists and the pacemaker that he has his MRI safe.) 3) PT/OT  Discussed this in detail with the wife who was at bedside and answered all questions. We will follow with you.  Amie Portland, MD [facility] [phone]  If 7pm to 7am, please call on call as listed on AMION.

## 2017-07-09 NOTE — Progress Notes (Signed)
[facility]                                                                              Patient Demographics  Darrell [name], is a 66 y.o. male, DOB - [birth year], ZOX:[id number]  Admit date - 07/07/2017   Admitting Physician Toy Baker, MD  Outpatient Primary MD for the patient is Boykin Nearing, MD  Outpatient specialists:   LOS - 1  days   Medical records reviewed and are as summarized below:    Chief Complaint  Patient presents with  . Seizures       Brief summary   Patient is a 66 year old male with CVA, residual dysphagia, neuromuscular disorder ( chronic inflammatory demyelinating polyneuropathy), bradycardia status post pacemaker in April 2018, hyperlipidemia, chronic kidney disease stage III presented with witnessed seizure and post ictal, altered mental status. Patient was admitted for further workup.   Assessment & Plan    Principal Problem:   New onset seizure ([name])in the setting of prior CVA - EEG showed evidence of mild generalized slowing of the brain consistent with postictal state however no epileptiform discharges or seizures. - Neurology consulted, recommended MRI of the brain and MRI C-spine - neurology recommended placing on Keppra, renal dosing, PTOT  Active Problems:   HTN (hypertension) - BP elevated, restart amlodipine, beta blocker, hydralazine.  - Hold ACE inhibitor, torsemide today     History of CVA with residual deficit - continue aspirin 81 mg daily, Lipitor.  - PTOT evaluation    CKD (chronic kidney disease) stage 3, GFR 30-59 ml/min ([name]) - baseline creatinine around 2, currently stable    At high risk for falls - PT evaluation pending    History of bradycardia Status cardiac pacemaker   Code Status: full  DVT Prophylaxis:  SCD's Family Communication: Discussed in detail with the patient, all imaging results, lab results explained to the patient  Disposition Plan: possible in 24hrs   Time Spent in  minutes   25 minutes  Procedures:  EEG  Consultants:   Neurology   Antimicrobials:      Medications  Scheduled Meds: . acetaminophen  1,000 mg Oral On Call to OR  . aspirin EC  81 mg Oral Daily  . folic acid  1 mg Intravenous Daily  . heparin  5,000 Units Subcutaneous Q8H  . midazolam  4 mg Intravenous Once  . thiamine  100 mg Intravenous Daily   Continuous Infusions: . sodium chloride 75 mL/hr (07/08/17 1334)  .  ceFAZolin (ANCEF) IV    . levETIRAcetam     PRN Meds:.acetaminophen **OR** acetaminophen, LORazepam, ondansetron **OR** ondansetron (ZOFRAN) IV   Antibiotics   Anti-infectives    Start     Dose/Rate Route Frequency Ordered Stop   07/09/17 0600  ceFAZolin (ANCEF) IVPB 2g/100 mL premix     2 g 200 mL/hr over 30 Minutes Intravenous On call to O.R. 07/08/17 1431 07/10/17 0559        Subjective:   Darrell [name] was seen and examined today. No repeat seizures, denies any complaints. Patient denies dizziness, chest pain, shortness of breath, abdominal pain, N/V/D/C, new weakness, numbess, tingling. No  acute events overnight.    Objective:   Vitals:   07/08/17 2113 07/09/17 0052 07/09/17 0547 07/09/17 0921  BP: 137/87 (!) 145/89 (!) 157/88 (!) 159/91  Pulse: 66 75 75 76  Resp: 16 16 16 20   Temp: 98.3 F (36.8 C) 98.7 F (37.1 C) 98.6 F (37 C) 98.4 F (36.9 C)  TempSrc: Oral Oral Oral Oral  SpO2: 95% 93% 93% 95%  Weight:      Height:        Intake/Output Summary (Last 24 hours) at 07/09/17 1058 Last data filed at 07/09/17 0921  Gross per 24 hour  Intake              120 ml  Output              325 ml  Net             -205 ml     Wt Readings from Last 3 Encounters:  07/07/17 81.6 kg (180 lb)  04/17/17 81.6 kg (180 lb)  02/20/17 79.7 kg (175 lb 9.6 oz)     Exam  General: Alert and oriented x 3, NAD  Eyes:   HEENT:  Atraumatic, normocephalic, normal oropharynx  Cardiovascular: S1 S2 auscultated, no rubs, murmurs or gallops. Regular  rate and rhythm.  Respiratory: Clear to auscultation bilaterally, no wheezing, rales or rhonchi  Gastrointestinal: Soft, nontender, nondistended, + bowel sounds  Ext: no pedal edema bilaterally  Neuro: AAOx3, Cr N's II- XII. Strength 5/5 upper and lower extremities right, slight weakness in LUE  Musculoskeletal: No digital cyanosis, clubbing  Skin: No rashes  Psych: Normal affect and demeanor, alert and oriented x3    Data Reviewed:  I have personally reviewed following labs and imaging studies  Micro Results No results found for this or any previous visit (from the past 240 hour(s)).  Radiology Reports Ct Head Wo Contrast  Result Date: 07/07/2017 CLINICAL DATA:  Acute onset of seizure.  Initial encounter. EXAM: CT HEAD WITHOUT CONTRAST TECHNIQUE: Contiguous axial images were obtained from the base of the skull through the vertex without intravenous contrast. COMPARISON:  CT of the head performed 12/06/2016 FINDINGS: Brain: No evidence of acute infarction, hemorrhage, hydrocephalus, extra-axial collection or mass lesion/mass effect. Prominence of the ventricles and sulci reflects mild cortical volume loss. A chronic infarct noted at the right occipital lobe, with associated encephalomalacia. Diffuse periventricular and subcortical white matter likely reflects small vessel ischemic microangiopathy. Chronic lacunar infarcts are noted at the right basal ganglia and bilateral thalami. Cerebellar atrophy is noted. The brainstem and fourth ventricle are within normal limits. No mass effect or midline shift is seen. Vascular: No hyperdense vessel or unexpected calcification. Skull: There is no evidence of fracture; visualized osseous structures are unremarkable in appearance. Sinuses/Orbits: The orbits are within normal limits. A mucus retention cyst or polyp is noted at the left maxillary sinus. The remaining paranasal sinuses and mastoid air cells are well-aerated. Other: No significant soft  tissue abnormalities are seen. IMPRESSION: 1. No acute intracranial pathology seen on CT. 2. Mild cortical volume loss and diffuse small vessel ischemic microangiopathy. 3. Chronic infarct at the right occipital lobe, with associated encephalomalacia. Chronic lacunar infarcts at the right basal ganglia and bilateral thalami. 4. Mucus retention cyst or polyp at the left maxillary sinus. Electronically Signed   By: Garald Balding M.D.   On: 07/07/2017 21:49   Dg Chest Port 1 View  Result Date: 07/07/2017 CLINICAL DATA:  Initial evaluation for acute seizure.  EXAM: PORTABLE CHEST 1 VIEW COMPARISON:  Prior radiograph from 01/17/2017. FINDINGS: Patient is rotated to the left. Cardiomegaly is unchanged. Mediastinal silhouette unchanged allowing for patient rotation. Aortic atherosclerosis. Left-sided Almyra Free transvenous pacemaker/ AICD noted. Defibrillator pad overlies left chest. Lungs hypoinflated. Mild perihilar vascular congestion without overt pulmonary edema. No focal infiltrates. No significant pleural effusion. No pneumothorax. No acute osseus abnormality.  Osteopenia noted. IMPRESSION: 1. Stable cardiomegaly with mild perihilar vascular congestion without pulmonary edema. 2. No other active cardiopulmonary disease. Electronically Signed   By: Jeannine Boga M.D.   On: 07/07/2017 21:15    Lab Data:  CBC:  Recent Labs Lab 07/07/17 2100 07/08/17 1328  WBC 9.8 9.2  NEUTROABS 5.1  --   HGB 12.2* 11.1*  HCT 38.5* 34.5*  MCV 90.2 89.6  PLT 275 423   Basic Metabolic Panel:  Recent Labs Lab 07/07/17 2100 07/08/17 1328 07/09/17 0529  NA 136 136 138  K 4.4 4.3 4.1  CL 104 108 108  CO2 20* 22 24  GLUCOSE 101* 162* 84  BUN 17 18 17   CREATININE 1.85* 2.07* 1.92*  CALCIUM 9.7 9.2 9.2  MG 2.0 1.8 1.6*  PHOS  --  2.6  --    GFR: Estimated Creatinine Clearance: 44.3 mL/min (A) (by C-G formula based on SCr of 1.92 mg/dL (H)). Liver Function Tests:  Recent Labs Lab 07/07/17 2100  07/09/17 0529  AST 18 13*  ALT 6* 8*  ALKPHOS 95 81  BILITOT 0.4 0.7  PROT 6.5 5.5*  ALBUMIN 3.4* 2.9*   No results for input(s): LIPASE, AMYLASE in the last 168 hours. No results for input(s): AMMONIA in the last 168 hours. Coagulation Profile: No results for input(s): INR, PROTIME in the last 168 hours. Cardiac Enzymes:  Recent Labs Lab 07/07/17 2100  TROPONINI <0.03   BNP (last 3 results) No results for input(s): PROBNP in the last 8760 hours. HbA1C: No results for input(s): HGBA1C in the last 72 hours. CBG: No results for input(s): GLUCAP in the last 168 hours. Lipid Profile: No results for input(s): CHOL, HDL, LDLCALC, TRIG, CHOLHDL, LDLDIRECT in the last 72 hours. Thyroid Function Tests:  Recent Labs  07/08/17 1328  TSH 0.476   Anemia Panel: No results for input(s): VITAMINB12, FOLATE, FERRITIN, TIBC, IRON, RETICCTPCT in the last 72 hours. Urine analysis:    Component Value Date/Time   COLORURINE YELLOW 07/08/2017 0818   APPEARANCEUR CLEAR 07/08/2017 0818   LABSPEC 1.010 07/08/2017 0818   PHURINE 6.0 07/08/2017 0818   GLUCOSEU NEGATIVE 07/08/2017 0818   HGBUR NEGATIVE 07/08/2017 0818   BILIRUBINUR NEGATIVE 07/08/2017 0818   BILIRUBINUR negative 08/10/2016 1658   KETONESUR NEGATIVE 07/08/2017 0818   PROTEINUR 30 (A) 07/08/2017 0818   UROBILINOGEN 1.0 08/10/2016 1658   UROBILINOGEN 4.0 (H) 08/04/2016 1517   NITRITE NEGATIVE 07/08/2017 0818   LEUKOCYTESUR NEGATIVE 07/08/2017 0818     Ripudeep Rai M.D. [facility] 07/09/2017, 10:58 AM  Pager: [phone] Between 7am to 7pm - call Pager - [phone]  After 7pm go to [email or web address] - password [email or web address]  Call night coverage person covering after 7pm

## 2017-07-10 ENCOUNTER — Inpatient Hospital Stay (HOSPITAL_COMMUNITY): Payer: Medicare PPO

## 2017-07-10 DIAGNOSIS — I10 Essential (primary) hypertension: Secondary | ICD-10-CM

## 2017-07-10 DIAGNOSIS — G6181 Chronic inflammatory demyelinating polyneuritis: Secondary | ICD-10-CM

## 2017-07-10 DIAGNOSIS — Z720 Tobacco use: Secondary | ICD-10-CM

## 2017-07-10 DIAGNOSIS — R001 Bradycardia, unspecified: Secondary | ICD-10-CM

## 2017-07-10 DIAGNOSIS — N183 Chronic kidney disease, stage 3 (moderate): Secondary | ICD-10-CM

## 2017-07-10 DIAGNOSIS — Z950 Presence of cardiac pacemaker: Secondary | ICD-10-CM

## 2017-07-10 DIAGNOSIS — D62 Acute posthemorrhagic anemia: Secondary | ICD-10-CM

## 2017-07-10 DIAGNOSIS — I6930 Unspecified sequelae of cerebral infarction: Secondary | ICD-10-CM

## 2017-07-10 DIAGNOSIS — R569 Unspecified convulsions: Principal | ICD-10-CM

## 2017-07-10 LAB — BASIC METABOLIC PANEL
ANION GAP: 6 (ref 5–15)
BUN: 14 mg/dL (ref 6–20)
CALCIUM: 9.5 mg/dL (ref 8.9–10.3)
CO2: 22 mmol/L (ref 22–32)
Chloride: 112 mmol/L — ABNORMAL HIGH (ref 101–111)
Creatinine, Ser: 1.77 mg/dL — ABNORMAL HIGH (ref 0.61–1.24)
GFR calc non Af Amer: 39 mL/min — ABNORMAL LOW (ref >60)
GFR, EST AFRICAN AMERICAN: 45 mL/min — AB (ref >60)
Glucose, Bld: 72 mg/dL (ref 65–99)
POTASSIUM: 4.3 mmol/L (ref 3.5–5.1)
Sodium: 140 mmol/L (ref 135–145)

## 2017-07-10 LAB — CBC
HEMATOCRIT: 34.9 % — AB (ref 39.0–52.0)
HEMOGLOBIN: 11.1 g/dL — AB (ref 13.0–17.0)
MCH: 28.6 pg (ref 26.0–34.0)
MCHC: 31.8 g/dL (ref 30.0–36.0)
MCV: 89.9 fL (ref 78.0–100.0)
Platelets: 240 10*3/uL (ref 150–400)
RBC: 3.88 MIL/uL — AB (ref 4.22–5.81)
RDW: 14 % (ref 11.5–15.5)
WBC: 7.5 10*3/uL (ref 4.0–10.5)

## 2017-07-10 MED ORDER — FOLIC ACID 1 MG PO TABS
1.0000 mg | ORAL_TABLET | Freq: Every day | ORAL | Status: DC
  Administered 2017-07-11 – 2017-07-12 (×2): 1 mg via ORAL
  Filled 2017-07-10 (×2): qty 1

## 2017-07-10 MED ORDER — VITAMIN B-1 100 MG PO TABS
100.0000 mg | ORAL_TABLET | Freq: Every day | ORAL | Status: DC
  Administered 2017-07-11 – 2017-07-12 (×2): 100 mg via ORAL
  Filled 2017-07-10 (×2): qty 1

## 2017-07-10 MED ORDER — ORAL CARE MOUTH RINSE
15.0000 mL | Freq: Two times a day (BID) | OROMUCOSAL | Status: DC
  Administered 2017-07-10 – 2017-07-12 (×4): 15 mL via OROMUCOSAL

## 2017-07-10 MED ORDER — VALPROATE SODIUM 500 MG/5ML IV SOLN
500.0000 mg | Freq: Two times a day (BID) | INTRAVENOUS | Status: DC
  Administered 2017-07-10 – 2017-07-12 (×5): 500 mg via INTRAVENOUS
  Filled 2017-07-10 (×6): qty 5

## 2017-07-10 NOTE — Progress Notes (Signed)
Subjective: No complaints  Exam: Vitals:   07/10/17 0844 07/10/17 0847  BP: (!) 151/90 (!) 151/90  Pulse: 61 62  Resp:    Temp: 98.1 F (36.7 C)   SpO2:    HEENT-  Normocephalic, no lesions, without obvious abnormality.  Normal external eye and conjunctiva.  Normal TM's bilaterally.  Normal auditory canals and external ears. Normal external nose, mucus membranes and septum.  Normal pharynx. Cardiovascular- S1, S2 normal, pulses palpable throughout   Lungs- chest clear, no wheezing, rales, normal symmetric air entry Neuro: drowsy but alert to place, and able to follow commands.  CN: Pupils are equal and round. They are symmetrically reactive from 3-->2 mm. EOMI without nystagmus. Facial sensation is intact to light touch. Face is symmetric at rest with normal strength and mobility. Hearing is intact to conversational voice. Palate elevates symmetrically and uvula is midline. Voice is normal in tone, pitch and quality. Bilateral SCM and trapezii are 5/5. Tongue is midline with normal bulk and mobility.  Motor: Normal bulk, tone, and strength. 5/5 throughout with exception of left UE 4/5. No drift.  Sensation: Intact to light touch.  DTRs: 3+, symmetric  Toes downgoing bilaterally. No pathologic reflexes.   Pertinent Labs/Diagnostics: MRI brain: IMPRESSION: Truncated and motion degraded examination. Within that limitation, no acute ischemia or significant mass effect. Advanced chronic microvascular disease  MRI cervical spine unable to be done due to agitation.   Etta Quill PA-C [facility] [phone]  Impression: New onset seizure.  Very drowsy this AM--may be SE from [place] will have to see if he awakens today. Given his Agitation Depakote may be a better fit. Given his extent of lethargy will change him to Depakote.   Recommendations: 1) Depakote 500 mg BID IV  07/10/2017, 9:20 AM   Attending addendum Patient seen and examined Patient was very agitated and  aggressive in the MRI. MRI brain reviewed. Motion degraded but no acute stroke  C-spine MRI unable to complete due to agitation. Upon review of charts, patient has had hyperreflexia and stiffness and has been worked up by outpatient neurology with EMG nerve conduction studies and spinal tap. His EMG nerve conduction studies are consistent with a demyelinating pattern is seen and CIDP. 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  On exam, he is more drowsy today, hence we would recommend changing his Keppra to Depakote 500 BID. Check levels in 5 days. Keppra might not be the best medication for him given his renal function and some agitation at baseline which might get exacerbated with Keppra.  Depakote might be a better choice as an antiepileptic and has some mood stabilizing value as well.  C-spine MRI can be done when convenient - even as outpatient. No urgency to obtain at this time.  Please call us with questions.  Amie Portland, MD [facility] [phone]  If 7pm to 7am, please call on call as listed on AMION.

## 2017-07-10 NOTE — Progress Notes (Signed)
Modified Barium Swallow Progress Note  Patient Details  Name: Darrell [name] MRN: [id number] Date of Birth: Apr 19, 1951  Today's Date: 07/10/2017  Modified Barium Swallow completed.  Full report located under Chart Review in the Imaging Section.  Brief recommendations include the following:  Clinical Impression  Pt has a mild-moderate oropharyngeal dysphagia that seems relatively similar to previous MBS in 2015, although it is also impacted by pt's lethargy this morning (may be medication related per MD note). Pt has decreased oral cohesion with some lingual rocking, slow transit, and lingual residue mostly with solids. He has somewhat passive spillage into his pharynx, with solids and liquids cascading from his base of tongue down into the pyriform sinuses before his swallow is triggered. Even with large, consecutive straw sips of thin liquids in isolation, he has only occasional, trace penetration that he clears with a cued throat clear. However, once pt has oral residue from solids in his mouth and along his base of tongue, it interferes with his ability to contain thin liquids, allowing them to spill silently into his trachea before the swallow. Recommend to continue current orders for Dsy 2 diet and thin liquids, but would avoid any mixed consistencies and would NOT use any liquid washes for clearing solids from his oral cavity. Purees and lingual sweeps could be used instead. His wife was present for study and education and verbalized her understanding as well. SLP also reinforced the importance of oral care to help reduce the risk of aspiration-related infections.   Swallow Evaluation Recommendations       SLP Diet Recommendations: Dysphagia 2 (Fine chop) solids;Thin liquid   Liquid Administration via: Straw   Medication Administration: Whole meds with puree   Supervision: Staff to assist with self feeding;Full supervision/cueing for compensatory strategies   Compensations: Slow  rate;Small sips/bites;Minimize environmental distractions;Lingual sweep for clearance of pocketing;Clear throat intermittently   Postural Changes: Remain semi-upright after after feeds/meals (Comment);Seated upright at 90 degrees   Oral Care Recommendations: Oral care BID   Other Recommendations: Have oral suction available    [name] 07/10/2017,12:11 PM   [name], M.A. CCC-SLP [phone]

## 2017-07-10 NOTE — Progress Notes (Signed)
[facility]                                                                              Patient Demographics  Darrell [name], is a 66 y.o. male, DOB - 02/15/1951, POE:[id number]  Admit date - 07/07/2017   Admitting Physician Toy Baker, MD  Outpatient Primary MD for the patient is Boykin Nearing, MD  Outpatient specialists:   LOS - 2  days   Medical records reviewed and are as summarized below:    Chief Complaint  Patient presents with  . Seizures       Brief summary   Patient is a 66 year old male with CVA, residual dysphagia, neuromuscular disorder ( chronic inflammatory demyelinating polyneuropathy), bradycardia status post pacemaker in April 2018, hyperlipidemia, chronic kidney disease stage III presented with witnessed seizure and post ictal, altered mental status. Patient was admitted for further workup.   Assessment & Plan    Principal Problem:   New onset seizure ([name])in the setting of prior CVA - EEG showed evidence of mild generalized slowing of the brain consistent with postictal state however no epileptiform discharges or seizures. - Neurology was consulted and recommended MRI of the brain and C-spine. Patient was very agitated during the MRI and did not complete MRI of the C-spine. MRI brain showed no acute stroke. - patient was started on Keppra on 10/15, however noted to be much more drowsy this morning. eurology recommended changing to Depakote which will help some mood stabilizing well with his dementia and agitation. - recommended outpatient MRI C-spine - patient has a history of demyelinating disorder/CIDP, followed outpatient by Dr Corwin Levins  Active Problems:   HTN (hypertension) - BP much stable, continue amlodipine, beta blocker, hydralazine.  - Hold ACE inhibitor, torsemide     History of CVA with residual deficit - continue aspirin 81 mg daily, Lipitor.  - PTOT evaluation recommended inpatient rehabilitation, consult  placed    CKD (chronic kidney disease) stage 3, GFR 30-59 ml/min ([name]) - baseline creatinine around 2  - currently improving    At high risk for falls - PT evaluation recommended CIR     History of bradycardia Status cardiac pacemaker  Code Status: full  DVT Prophylaxis:  SCD's Family Communication: Discussed in detail with the patient, all imaging results, lab results explained to the patient and wife Disposition Plan: waiting inpatient rehabilitation  Time Spent in minutes   25 minutes  Procedures:  EEG MRI of the brain  Consultants:   Neurology   Antimicrobials:      Medications  Scheduled Meds: . amLODipine  10 mg Oral q morning - 10a  . aspirin EC  81 mg Oral Daily  . atorvastatin  40 mg Oral QHS  . brimonidine  1 drop Both Eyes BID  . dorzolamide-timolol  1 drop Both Eyes BID  . [START ON 31/54/0086] folic acid  1 mg Oral Daily  . heparin  5,000 Units Subcutaneous Q8H  . hydrALAZINE  50 mg Oral TID  . latanoprost  1 drop Both Eyes QHS  . mouth rinse  15 mL Mouth Rinse BID  . metoprolol tartrate  25 mg Oral  BID  . midazolam  4 mg Intravenous Once  . multivitamins with iron   Oral Daily  . pantoprazole  40 mg Oral Daily  . [START ON 07/11/2017] thiamine  100 mg Oral Daily   Continuous Infusions: . sodium chloride 75 mL/hr (07/10/17 1140)  . valproate sodium     PRN Meds:.acetaminophen **OR** acetaminophen, fluticasone, LORazepam, ondansetron **OR** ondansetron (ZOFRAN) IV   Antibiotics   Anti-infectives    Start     Dose/Rate Route Frequency Ordered Stop   07/09/17 0600  ceFAZolin (ANCEF) IVPB 2g/100 mL premix     2 g 200 mL/hr over 30 Minutes Intravenous On call to O.R. 07/08/17 1431 07/10/17 0559        Subjective:   Darrell [name] was seen and examined today. Somewhat drowsy today, not eating too well. Wife at the bedside concerned about the [place]. No repeat seizures. No pain, nausea or vomiting, fevers.   Objective:   Vitals:   07/10/17  0414 07/10/17 0844 07/10/17 0847 07/10/17 1140  BP: (!) 151/83 (!) 151/90 (!) 151/90 122/75  Pulse: (!) 59 61 62   Resp: 20   16  Temp: 98.3 F (36.8 C) 98.1 F (36.7 C)  98.8 F (37.1 C)  TempSrc: Oral Oral  Oral  SpO2: 100%   100%  Weight:      Height:        Intake/Output Summary (Last 24 hours) at 07/10/17 1208 Last data filed at 07/10/17 7510  Gross per 24 hour  Intake              880 ml  Output              900 ml  Net              -20 ml     Wt Readings from Last 3 Encounters:  07/07/17 81.6 kg (180 lb)  04/17/17 81.6 kg (180 lb)  02/20/17 79.7 kg (175 lb 9.6 oz)     Exam   General: Alert and oriented x 1, NAD  Eyes:   HEENT:  Atraumatic, normocephalic  Cardiovascular: S1 S2 clear, RRR  Respiratory: Clear to auscultation bilaterally, no wheezing, rales or rhonchi  Gastrointestinal: Soft, nontender, nondistended, + bowel sounds  Ext: no pedal edema bilaterally  Neuro: LUE 4/5, strength 5/5 in RUE, RLE, LLE  Musculoskeletal: No digital cyanosis, clubbing  Skin: No rashes  Psych: somewhat drowsy and confused   Data Reviewed:  I have personally reviewed following labs and imaging studies  Micro Results No results found for this or any previous visit (from the past 240 hour(s)).  Radiology Reports Ct Head Wo Contrast  Result Date: 07/07/2017 CLINICAL DATA:  Acute onset of seizure.  Initial encounter. EXAM: CT HEAD WITHOUT CONTRAST TECHNIQUE: Contiguous axial images were obtained from the base of the skull through the vertex without intravenous contrast. COMPARISON:  CT of the head performed 12/06/2016 FINDINGS: Brain: No evidence of acute infarction, hemorrhage, hydrocephalus, extra-axial collection or mass lesion/mass effect. Prominence of the ventricles and sulci reflects mild cortical volume loss. A chronic infarct noted at the right occipital lobe, with associated encephalomalacia. Diffuse periventricular and subcortical white matter likely  reflects small vessel ischemic microangiopathy. Chronic lacunar infarcts are noted at the right basal ganglia and bilateral thalami. Cerebellar atrophy is noted. The brainstem and fourth ventricle are within normal limits. No mass effect or midline shift is seen. Vascular: No hyperdense vessel or unexpected calcification. Skull: There is no evidence of fracture;  visualized osseous structures are unremarkable in appearance. Sinuses/Orbits: The orbits are within normal limits. A mucus retention cyst or polyp is noted at the left maxillary sinus. The remaining paranasal sinuses and mastoid air cells are well-aerated. Other: No significant soft tissue abnormalities are seen. IMPRESSION: 1. No acute intracranial pathology seen on CT. 2. Mild cortical volume loss and diffuse small vessel ischemic microangiopathy. 3. Chronic infarct at the right occipital lobe, with associated encephalomalacia. Chronic lacunar infarcts at the right basal ganglia and bilateral thalami. 4. Mucus retention cyst or polyp at the left maxillary sinus. Electronically Signed   By: Garald Balding M.D.   On: 07/07/2017 21:49   Mr Brain Wo Contrast (neuro Protocol)  Result Date: 07/09/2017 CLINICAL DATA:  Seizure and altered mental status EXAM: MRI HEAD WITHOUT CONTRAST TECHNIQUE: Multiplanar, multiecho pulse sequences of the brain and surrounding structures were obtained without intravenous contrast. COMPARISON:  Head CT 07/07/2017 FINDINGS: The examination had to be discontinued prior to completion due to patient altered mental status and inability to cooperate with the technologist's instructions. Diffusion-weighted imaging shows no evidence of acute ischemia. There is advanced bilateral white matter hyperintensity consistent with chronic ischemic microangiopathy. Old right basal ganglia lacunar infarct. No midline shift or other mass effect. IMPRESSION: Truncated and motion degraded examination. Within that limitation, no acute ischemia or  significant mass effect. Advanced chronic microvascular disease. Electronically Signed   By: Ulyses Jarred M.D.   On: 07/09/2017 16:41   Dg Chest Port 1 View  Result Date: 07/07/2017 CLINICAL DATA:  Initial evaluation for acute seizure. EXAM: PORTABLE CHEST 1 VIEW COMPARISON:  Prior radiograph from 01/17/2017. FINDINGS: Patient is rotated to the left. Cardiomegaly is unchanged. Mediastinal silhouette unchanged allowing for patient rotation. Aortic atherosclerosis. Left-sided [place] Free transvenous pacemaker/ AICD noted. Defibrillator pad overlies left chest. Lungs hypoinflated. Mild perihilar vascular congestion without overt pulmonary edema. No focal infiltrates. No significant pleural effusion. No pneumothorax. No acute osseus abnormality.  Osteopenia noted. IMPRESSION: 1. Stable cardiomegaly with mild perihilar vascular congestion without pulmonary edema. 2. No other active cardiopulmonary disease. Electronically Signed   By: Jeannine Boga M.D.   On: 07/07/2017 21:15    Lab Data:  CBC:  Recent Labs Lab 07/07/17 2100 07/08/17 1328 07/10/17 0603  WBC 9.8 9.2 7.5  NEUTROABS 5.1  --   --   HGB 12.2* 11.1* 11.1*  HCT 38.5* 34.5* 34.9*  MCV 90.2 89.6 89.9  PLT 275 230 916   Basic Metabolic Panel:  Recent Labs Lab 07/07/17 2100 07/08/17 1328 07/09/17 0529 07/10/17 0603  NA 136 136 138 140  K 4.4 4.3 4.1 4.3  CL 104 108 108 112*  CO2 20* 22 24 22   GLUCOSE 101* 162* 84 72  BUN 17 18 17 14   CREATININE 1.85* 2.07* 1.92* 1.77*  CALCIUM 9.7 9.2 9.2 9.5  MG 2.0 1.8 1.6*  --   PHOS  --  2.6  --   --    GFR: Estimated Creatinine Clearance: 48 mL/min (A) (by C-G formula based on SCr of 1.77 mg/dL (H)). Liver Function Tests:  Recent Labs Lab 07/07/17 2100 07/09/17 0529  AST 18 13*  ALT 6* 8*  ALKPHOS 95 81  BILITOT 0.4 0.7  PROT 6.5 5.5*  ALBUMIN 3.4* 2.9*   No results for input(s): LIPASE, AMYLASE in the last 168 hours. No results for input(s): AMMONIA in the last 168  hours. Coagulation Profile: No results for input(s): INR, PROTIME in the last 168 hours. Cardiac Enzymes:  Recent Labs  Lab 07/07/17 2100  TROPONINI <0.03   BNP (last 3 results) No results for input(s): PROBNP in the last 8760 hours. HbA1C: No results for input(s): HGBA1C in the last 72 hours. CBG: No results for input(s): GLUCAP in the last 168 hours. Lipid Profile: No results for input(s): CHOL, HDL, LDLCALC, TRIG, CHOLHDL, LDLDIRECT in the last 72 hours. Thyroid Function Tests:  Recent Labs  07/08/17 1328  TSH 0.476   Anemia Panel: No results for input(s): VITAMINB12, FOLATE, FERRITIN, TIBC, IRON, RETICCTPCT in the last 72 hours. Urine analysis:    Component Value Date/Time   COLORURINE YELLOW 07/08/2017 0818   APPEARANCEUR CLEAR 07/08/2017 0818   LABSPEC 1.010 07/08/2017 0818   PHURINE 6.0 07/08/2017 0818   GLUCOSEU NEGATIVE 07/08/2017 0818   HGBUR NEGATIVE 07/08/2017 0818   BILIRUBINUR NEGATIVE 07/08/2017 0818   BILIRUBINUR negative 08/10/2016 1658   KETONESUR NEGATIVE 07/08/2017 0818   PROTEINUR 30 (A) 07/08/2017 0818   UROBILINOGEN 1.0 08/10/2016 1658   UROBILINOGEN 4.0 (H) 08/04/2016 1517   NITRITE NEGATIVE 07/08/2017 0818   LEUKOCYTESUR NEGATIVE 07/08/2017 0818     Rylyn Zawistowski M.D. [facility] 07/10/2017, 12:08 PM  Pager: [phone] Between 7am to 7pm - call Pager - [phone]  After 7pm go to [email or web address] - password [email or web address]  Call night coverage person covering after 7pm

## 2017-07-11 LAB — BASIC METABOLIC PANEL
Anion gap: 5 (ref 5–15)
BUN: 12 mg/dL (ref 6–20)
CHLORIDE: 113 mmol/L — AB (ref 101–111)
CO2: 22 mmol/L (ref 22–32)
CREATININE: 1.83 mg/dL — AB (ref 0.61–1.24)
Calcium: 9.1 mg/dL (ref 8.9–10.3)
GFR calc non Af Amer: 37 mL/min — ABNORMAL LOW (ref >60)
GFR, EST AFRICAN AMERICAN: 43 mL/min — AB (ref >60)
GLUCOSE: 81 mg/dL (ref 65–99)
Potassium: 4.5 mmol/L (ref 3.5–5.1)
Sodium: 140 mmol/L (ref 135–145)

## 2017-07-11 LAB — CBC
HCT: 34.1 % — ABNORMAL LOW (ref 39.0–52.0)
HEMOGLOBIN: 11.1 g/dL — AB (ref 13.0–17.0)
MCH: 29.2 pg (ref 26.0–34.0)
MCHC: 32.6 g/dL (ref 30.0–36.0)
MCV: 89.7 fL (ref 78.0–100.0)
PLATELETS: 236 10*3/uL (ref 150–400)
RBC: 3.8 MIL/uL — AB (ref 4.22–5.81)
RDW: 14 % (ref 11.5–15.5)
WBC: 7.3 10*3/uL (ref 4.0–10.5)

## 2017-07-11 NOTE — Progress Notes (Signed)
PROGRESS NOTE    Darrell [name]  OEV:[id number] DOB: 1951/01/10 DOA: 07/07/2017 PCP: Boykin Nearing, MD   Chief Complaint  Patient presents with  . Seizures    Brief Narrative:  HPI On 07/08/2017 by Dr. Dia Crawford Darrell [name] is a 66 y.o. BM PMHx  CVA with residual dysphagia, Neuromuscular disorder(chronic inflammatory demyelinating polyneuropathy) HTN,Symptomatic Bradycardia S/P pacemaker 12/2016, HLD, tobacco abuse, CKD stage 3  Presented by EMS after witnessed seizure,with altered mental status(postictal) .In route the patient continued to have seizure-like activity, and received Versed, 5 mg per This seemed to slow the seizure activity, though did not cease entirely.The patient himself has a nasal trumpet in place, is actively receiving supplemental oxygen, his not responding to verbal stimuli, level 5 caveatsecondary to change in mental status. No previous history of seizure. Uses walker at home for ambulation. Per wife patient had seizure activity for ~10-15 minutes which spontaneously resolved just prior to EMS arriving on seen.states negative loss of bowel or bladder control. No new changes to medication. Does not drink. Per [facility] case discussed with Neurology who recommended EEG Assessment & Plan   New onset seizure in the setting of prior CVA -EEG showed evidence of mild generalized slowing of brain consistent with postictal state however no epileptiform discharges or seizures -Neurology consulted and appreciated, recommended MRI of the brain and C-spine however patient cannot complete MRI due to agitation -MRI brain showed no acute stroke -Patient was placed on Keppra 07/09/2017 however appeared to be much more drowsy and was transitioned to Depakote -Patient to have MRI C-spine as outpatient -He does follow with Dr. Corwin Levins for demyelinating disorder/CIDP  Essential hypertension -BP stable, continue amlodipine, hydralazine, metoprolol  History of CVA with  residual deficit -Continue aspirin, statin -Pending SNF placement  Chronic kidney disease, stage III -Baseline creatinine approximately 2, currently 1.83 -Continue to monitor BMP  History of bradycardia -Status post pacemaker placement  DVT Prophylaxis  Heparin   Code Status: Full  Family Communication: None at bedside  Disposition Plan: Admitted, pending SNF  Consultants Neurology  Procedures  EEG  Antibiotics   Anti-infectives    Start     Dose/Rate Route Frequency Ordered Stop   07/09/17 0600  ceFAZolin (ANCEF) IVPB 2g/100 mL premix     2 g 200 mL/hr over 30 Minutes Intravenous On call to O.R. 07/08/17 1431 07/10/17 0559      Subjective:   [name] seen and examined today. Patient has no complaints today. Denies chest pain, shortness breath, abdominal pain, nausea vomiting diarrhea constipation.  Objective:   Vitals:   07/11/17 0030 07/11/17 0505 07/11/17 0941 07/11/17 1343  BP: (!) 144/81 135/79 126/73 119/69  Pulse: 60 (!) 59 62 61  Resp: 18 18 18 18   Temp: 98.6 F (37 C) 98.4 F (36.9 C) 98.9 F (37.2 C) 98.2 F (36.8 C)  TempSrc: Oral Oral Oral Oral  SpO2: 97% 96% 97% 97%  Weight:      Height:        Intake/Output Summary (Last 24 hours) at 07/11/17 1547 Last data filed at 07/11/17 0900  Gross per 24 hour  Intake          2178.75 ml  Output              900 ml  Net          1278.75 ml   Filed Weights   07/07/17 2053  Weight: 81.6 kg (180 lb)    Exam  General:  Well developed, well nourished, NAD, appears stated age  109: NCAT, mucous membranes moist.   Cardiovascular: S1 S2 auscultated, RRR, no murmurs  Respiratory: Clear to auscultation bilaterally with equal chest rise  Abdomen: Soft, nontender, nondistended, + bowel sounds  Extremities: warm dry without cyanosis clubbing or edema  Neuro: AAOx3, LUE weakness 4/5, otherwise nonfocal  Psych: Normal affect and demeanor    Data Reviewed: I have personally reviewed  following labs and imaging studies  CBC:  Recent Labs Lab 07/07/17 2100 07/08/17 1328 07/10/17 0603 07/11/17 0504  WBC 9.8 9.2 7.5 7.3  NEUTROABS 5.1  --   --   --   HGB 12.2* 11.1* 11.1* 11.1*  HCT 38.5* 34.5* 34.9* 34.1*  MCV 90.2 89.6 89.9 89.7  PLT 275 230 240 295   Basic Metabolic Panel:  Recent Labs Lab 07/07/17 2100 07/08/17 1328 07/09/17 0529 07/10/17 0603 07/11/17 0504  NA 136 136 138 140 140  K 4.4 4.3 4.1 4.3 4.5  CL 104 108 108 112* 113*  CO2 20* 22 24 22 22   GLUCOSE 101* 162* 84 72 81  BUN 17 18 17 14 12   CREATININE 1.85* 2.07* 1.92* 1.77* 1.83*  CALCIUM 9.7 9.2 9.2 9.5 9.1  MG 2.0 1.8 1.6*  --   --   PHOS  --  2.6  --   --   --    GFR: Estimated Creatinine Clearance: 46.4 mL/min (A) (by C-G formula based on SCr of 1.83 mg/dL (H)). Liver Function Tests:  Recent Labs Lab 07/07/17 2100 07/09/17 0529  AST 18 13*  ALT 6* 8*  ALKPHOS 95 81  BILITOT 0.4 0.7  PROT 6.5 5.5*  ALBUMIN 3.4* 2.9*   No results for input(s): LIPASE, AMYLASE in the last 168 hours. No results for input(s): AMMONIA in the last 168 hours. Coagulation Profile: No results for input(s): INR, PROTIME in the last 168 hours. Cardiac Enzymes:  Recent Labs Lab 07/07/17 2100  TROPONINI <0.03   BNP (last 3 results) No results for input(s): PROBNP in the last 8760 hours. HbA1C: No results for input(s): HGBA1C in the last 72 hours. CBG: No results for input(s): GLUCAP in the last 168 hours. Lipid Profile: No results for input(s): CHOL, HDL, LDLCALC, TRIG, CHOLHDL, LDLDIRECT in the last 72 hours. Thyroid Function Tests: No results for input(s): TSH, T4TOTAL, FREET4, T3FREE, THYROIDAB in the last 72 hours. Anemia Panel: No results for input(s): VITAMINB12, FOLATE, FERRITIN, TIBC, IRON, RETICCTPCT in the last 72 hours. Urine analysis:    Component Value Date/Time   COLORURINE YELLOW 07/08/2017 0818   APPEARANCEUR CLEAR 07/08/2017 0818   LABSPEC 1.010 07/08/2017 0818    PHURINE 6.0 07/08/2017 0818   GLUCOSEU NEGATIVE 07/08/2017 0818   HGBUR NEGATIVE 07/08/2017 0818   BILIRUBINUR NEGATIVE 07/08/2017 0818   BILIRUBINUR negative 08/10/2016 1658   KETONESUR NEGATIVE 07/08/2017 0818   PROTEINUR 30 (A) 07/08/2017 0818   UROBILINOGEN 1.0 08/10/2016 1658   UROBILINOGEN 4.0 (H) 08/04/2016 1517   NITRITE NEGATIVE 07/08/2017 0818   LEUKOCYTESUR NEGATIVE 07/08/2017 0818   Sepsis Labs: @LABRCNTIP (procalcitonin:4,lacticidven:4)  )No results found for this or any previous visit (from the past 240 hour(s)).    Radiology Studies: Mr Brain Wo Contrast (neuro Protocol)  Result Date: 07/09/2017 CLINICAL DATA:  Seizure and altered mental status EXAM: MRI HEAD WITHOUT CONTRAST TECHNIQUE: Multiplanar, multiecho pulse sequences of the brain and surrounding structures were obtained without intravenous contrast. COMPARISON:  Head CT 07/07/2017 FINDINGS: The examination had to be discontinued prior to completion  due to patient altered mental status and inability to cooperate with the technologist's instructions. Diffusion-weighted imaging shows no evidence of acute ischemia. There is advanced bilateral white matter hyperintensity consistent with chronic ischemic microangiopathy. Old right basal ganglia lacunar infarct. No midline shift or other mass effect. IMPRESSION: Truncated and motion degraded examination. Within that limitation, no acute ischemia or significant mass effect. Advanced chronic microvascular disease. Electronically Signed   By: Ulyses Jarred M.D.   On: 07/09/2017 16:41   Dg Swallowing Func-speech Pathology  Result Date: 07/10/2017 Objective Swallowing Evaluation: Type of Study: MBS-Modified Barium Swallow Study Patient Details Name: KEIONTE SWICEGOOD MRN: [id number] Date of Birth: 06-Apr-1951 Today's Date: 07/10/2017 Time: SLP Start Time (ACUTE ONLY): 1041-SLP Stop Time (ACUTE ONLY): 1107 SLP Time Calculation (min) (ACUTE ONLY): 26 min Past Medical History: Past Medical  History: Diagnosis Date . Chronic kidney disease   stage 3 GFR 30-59 ml/min  . Dysphagia as late effect of cerebrovascular disease   pts wife states pt has to eat soft foods  . GERD (gastroesophageal reflux disease)  . Glaucoma  . High cholesterol  . Hypertension  . Neuromuscular disorder ([place])   chronic inflammatory demyelinating polyneuropathy  . Stroke [facility])   2011 with residual deficit left sided weakness . Tobacco dependence  Past Surgical History: Past Surgical History: Procedure Laterality Date . EYE SURGERY   . INGUINAL HERNIA REPAIR Right 11/23/2014  Procedure: right inguinal hernia repair with mesh;  Surgeon: Armandina Gemma, MD;  Location: [facility];  Service: General;  Laterality: Right; . INSERTION OF MESH N/A 11/23/2014  Procedure: INSERTION OF MESH;  Surgeon: Armandina Gemma, MD;  Location: [facility];  Service: General;  Laterality: N/A; . PACEMAKER IMPLANT N/A 01/16/2017  Procedure: Pacemaker Implant;  Surgeon: Will Meredith Leeds, MD;  Location: [place] [facility];  Service: Cardiovascular;  Laterality: N/A; . SHOULDER SURGERY Bilateral 1988, 3828 HPI: 66 year old history of prior CVA, CK D, dysphagia, GERD, HLD, HTN presents with new onset seizures. Treated with Versed and then loaded with Depakote in ER. ER M.D. discuss case with neurology who recommends admission to medicine for EEG in a.m. recommended initially MRI unable to obtain as patient does have history of pacemaker.  CT head unremarkable.   Subjective: pt drowsy this morning Assessment / Plan / Recommendation CHL IP CLINICAL IMPRESSIONS 07/10/2017 Clinical Impression Pt has a mild-moderate oropharyngeal dysphagia that seems relatively similar to previous MBS in 2015, although it is also impacted by pt's lethargy this morning (may be medication related per MD note). Pt has decreased oral cohesion with some lingual rocking, slow transit, and lingual residue mostly with solids. He has somewhat passive spillage into his pharynx, with solids and liquids  cascading from his base of tongue down into the pyriform sinuses before his swallow is triggered. Even with large, consecutive straw sips of thin liquids in isolation, he has only occasional, trace penetration that he clears with a cued throat clear. However, once pt has oral residue from solids in his mouth and along his base of tongue, it interferes with his ability to contain thin liquids, allowing them to spill silently into his trachea before the swallow. Recommend to continue current orders for Dsy 2 diet and thin liquids, but would avoid any mixed consistencies and would NOT use any liquid washes for clearing solids from his oral cavity. Purees and lingual sweeps could be used instead. His wife was present for study and education and verbalized her understanding as well. SLP also reinforced the importance of  oral care to help reduce the risk of aspiration-related infections. SLP Visit Diagnosis Dysphagia, oropharyngeal phase (R13.12) Attention and concentration deficit following -- Frontal lobe and executive function deficit following -- Impact on safety and function Mild aspiration risk   CHL IP TREATMENT RECOMMENDATION 07/10/2017 Treatment Recommendations Therapy as outlined in treatment plan below   Prognosis 07/10/2017 Prognosis for Safe Diet Advancement Fair Barriers to Reach Goals Cognitive deficits;Time post onset Barriers/Prognosis Comment -- CHL IP DIET RECOMMENDATION 07/10/2017 SLP Diet Recommendations Dysphagia 2 (Fine chop) solids;Thin liquid Liquid Administration via Straw Medication Administration Whole meds with puree Compensations Slow rate;Small sips/bites;Minimize environmental distractions;Lingual sweep for clearance of pocketing;Clear throat intermittently Postural Changes Remain semi-upright after after feeds/meals (Comment);Seated upright at 90 degrees   CHL IP OTHER RECOMMENDATIONS 07/10/2017 Recommended Consults -- Oral Care Recommendations Oral care BID Other Recommendations Have oral  suction available   CHL IP FOLLOW UP RECOMMENDATIONS 07/10/2017 Follow up Recommendations Inpatient Rehab   CHL IP FREQUENCY AND DURATION 07/10/2017 Speech Therapy Frequency (ACUTE ONLY) min 2x/week Treatment Duration 2 weeks      CHL IP ORAL PHASE 07/10/2017 Oral Phase Impaired Oral - Pudding Teaspoon -- Oral - Pudding Cup -- Oral - Honey Teaspoon -- Oral - Honey Cup -- Oral - Nectar Teaspoon -- Oral - Nectar Cup -- Oral - Nectar Straw Weak lingual manipulation;Lingual pumping;Reduced posterior propulsion;Delayed oral transit;Decreased bolus cohesion Oral - Thin Teaspoon -- Oral - Thin Cup -- Oral - Thin Straw Weak lingual manipulation;Lingual pumping;Reduced posterior propulsion;Delayed oral transit;Decreased bolus cohesion Oral - Puree Weak lingual manipulation;Lingual pumping;Reduced posterior propulsion;Delayed oral transit;Decreased bolus cohesion Oral - Mech Soft Weak lingual manipulation;Lingual pumping;Reduced posterior propulsion;Delayed oral transit;Decreased bolus cohesion;Impaired mastication;Lingual/palatal residue Oral - Regular -- Oral - Multi-Consistency -- Oral - Pill -- Oral Phase - Comment --  CHL IP PHARYNGEAL PHASE 07/10/2017 Pharyngeal Phase Impaired Pharyngeal- Pudding Teaspoon -- Pharyngeal -- Pharyngeal- Pudding Cup -- Pharyngeal -- Pharyngeal- Honey Teaspoon -- Pharyngeal -- Pharyngeal- Honey Cup -- Pharyngeal -- Pharyngeal- Nectar Teaspoon -- Pharyngeal -- Pharyngeal- Nectar Cup -- Pharyngeal -- Pharyngeal- Nectar Straw Delayed swallow initiation-pyriform sinuses Pharyngeal -- Pharyngeal- Thin Teaspoon -- Pharyngeal -- Pharyngeal- Thin Cup -- Pharyngeal -- Pharyngeal- Thin Straw Delayed swallow initiation-pyriform sinuses;Penetration/Aspiration before swallow Pharyngeal Material enters airway, passes BELOW cords without attempt by patient to eject out (silent aspiration);Material enters airway, remains ABOVE vocal cords and not ejected out Pharyngeal- Puree Delayed swallow  initiation-pyriform sinuses Pharyngeal -- Pharyngeal- Mechanical Soft Delayed swallow initiation-pyriform sinuses Pharyngeal -- Pharyngeal- Regular -- Pharyngeal -- Pharyngeal- Multi-consistency -- Pharyngeal -- Pharyngeal- Pill -- Pharyngeal -- Pharyngeal Comment --  CHL IP CERVICAL ESOPHAGEAL PHASE 07/10/2017 Cervical Esophageal Phase WFL Pudding Teaspoon -- Pudding Cup -- Honey Teaspoon -- Honey Cup -- Nectar Teaspoon -- Nectar Cup -- Nectar Straw -- Thin Teaspoon -- Thin Cup -- Thin Straw -- Puree -- Mechanical Soft -- Regular -- Multi-consistency -- Pill -- Cervical Esophageal Comment -- CHL IP GO 11/05/2013 Functional Assessment Tool Used mbs, clinical judgement Functional Limitations Swallowing Swallow Current Status (Y4034) CJ Swallow Goal Status (V4259) CJ Swallow Discharge Status (D6387) CJ Motor Speech Current Status (F6433) (None) Motor Speech Goal Status (I9518) (None) Motor Speech Goal Status (A4166) (None) Spoken Language Comprehension Current Status (A6301) (None) Spoken Language Comprehension Goal Status (S0109) (None) Spoken Language Comprehension Discharge Status (N2355) (None) Spoken Language Expression Current Status (D3220) (None) Spoken Language Expression Goal Status (U5427) (None) Spoken Language Expression Discharge Status (C6237) (None) Attention Current Status (S2831) (None) Attention Goal Status (D1761) (None) Attention  Discharge Status [phone]) (None) Memory Current Status [phone]) (None) Memory Goal Status (D2202) (None) Memory Discharge Status (R4270) (None) Voice Current Status (W2376) (None) Voice Goal Status (E8315) (None) Voice Discharge Status (V7616) (None) Other Speech-Language Pathology Functional Limitation Current Status (W7371) (None) Other Speech-Language Pathology Functional Limitation Goal Status (G6269) (None) Other Speech-Language Pathology Functional Limitation Discharge Status [phone]) (None) [name] 07/10/2017, 12:12 PM  [name], M.A. CCC-SLP  [phone]               Scheduled Meds: . amLODipine  10 mg Oral q morning - 10a  . aspirin EC  81 mg Oral Daily  . atorvastatin  40 mg Oral QHS  . brimonidine  1 drop Both Eyes BID  . dorzolamide-timolol  1 drop Both Eyes BID  . folic acid  1 mg Oral Daily  . heparin  5,000 Units Subcutaneous Q8H  . hydrALAZINE  50 mg Oral TID  . latanoprost  1 drop Both Eyes QHS  . mouth rinse  15 mL Mouth Rinse BID  . metoprolol tartrate  25 mg Oral BID  . midazolam  4 mg Intravenous Once  . multivitamins with iron   Oral Daily  . pantoprazole  40 mg Oral Daily  . thiamine  100 mg Oral Daily   Continuous Infusions: . sodium chloride 75 mL/hr (07/11/17 0247)  . valproate sodium Stopped (07/11/17 1114)     LOS: 3 days   Time Spent in minutes   30 minutes  Dashawn Bartnick D.O. on 07/11/2017 at 3:47 PM  Between 7am to 7pm - Pager - [phone]  After 7pm go to [email or web address] - password [email or web address]  And look for the night coverage person covering for me after hours  [facility] Office  [phone]

## 2017-07-11 NOTE — Progress Notes (Signed)
I met with pt and his wife at bedside to discuss my recommendation for SNF rehab at this time. Wife is in agreement. We will sign off at this time. I have alerted RN CM. Noted SW working on SNF. [phone]

## 2017-07-11 NOTE — NC FL2 (Signed)
[place] LEVEL OF CARE SCREENING TOOL     IDENTIFICATION  Patient Name: Darrell [name] Birthdate: [birth year] Sex: male Admission Date (Current Location): 07/07/2017  [facility] and [place] Number:  Herbalist and Address:  [facility], [place] [address], [place], [place] [address]      Provider Number: [id number]  Attending Physician Name and Address:  Cristal Ford, DO  Relative Name and Phone Number:       Current Level of Care: Hospital Recommended Level of Care: [place] Prior Approval Number:    Date Approved/Denied:   PASRR Number: [id number]  Discharge Plan: SNF    Current Diagnoses: Patient Active Problem List   Diagnosis Date Noted  . CIDP (chronic inflammatory demyelinating polyneuropathy) ([place])   . Bradycardia   . Acute blood loss anemia   . New onset seizure ([place]) 07/08/2017  . Status cardiac pacemaker 01/29/2017  . Unintended weight loss 01/29/2017  . Tinea pedis of right foot 01/29/2017  . Hypercalcemia 01/15/2017  . Heart block 01/15/2017  . Constipation 09/12/2016  . Glaucoma 09/12/2016  . Liver hemangioma 08/14/2016  . Renal cyst 08/14/2016  . Renal mass, right 08/14/2016  . Elevated liver enzymes 08/10/2016  . CKD (chronic kidney disease) stage 3, GFR 30-59 ml/min ([name]) 08/16/2015  . At high risk for falls 08/16/2015  . Subcutaneous nodules 08/16/2015  . Prediabetes 10/07/2013  . Inguinal hernia 03/25/2013  . History of CVA with residual deficit 03/25/2013  . HTN (hypertension) 10/09/2012  . Tobacco abuse 10/09/2012    Orientation RESPIRATION BLADDER Height & Weight     Self, Time, Situation, Place  Normal Incontinent, External catheter (catheter placed 07/10/17) Weight: 180 lb (81.6 kg) Height:  6\' 4"  (193 cm)  BEHAVIORAL SYMPTOMS/MOOD NEUROLOGICAL BOWEL NUTRITION STATUS    Convulsions/Seizures Continent Diet (fine chop)  AMBULATORY STATUS COMMUNICATION OF NEEDS Skin    Extensive Assist Verbally Normal                       Personal Care Assistance Level of Assistance  Bathing, Feeding, Dressing Bathing Assistance: Maximum assistance Feeding assistance: Limited assistance Dressing Assistance: Maximum assistance     Functional Limitations Info             SPECIAL CARE FACTORS FREQUENCY  PT (By licensed PT), OT (By licensed OT)     PT Frequency: 5x/wk OT Frequency: 5x/wk            Contractures      Additional Factors Info  Code Status, Allergies Code Status Info: full Allergies Info: Doxycycline, [place]and Hct Candesartan Cilexetil-hctz, Shellfish Allergy           Current Medications (07/11/2017):  This is the current hospital active medication list Current Facility-Administered Medications  Medication Dose Route Frequency Provider Last Rate Last Dose  . 0.9 %  sodium chloride infusion  75 mL/hr Intravenous Continuous Allie Bossier, MD 75 mL/hr at 07/11/17 0247 75 mL/hr at 07/11/17 0247  . acetaminophen (TYLENOL) tablet 650 mg  650 mg Oral Q6H PRN Toy Baker, MD       Or  . acetaminophen (TYLENOL) suppository 650 mg  650 mg Rectal Q6H PRN Doutova, Anastassia, MD      . amLODipine (NORVASC) tablet 10 mg  10 mg Oral q morning - 10a Rai, Ripudeep K, MD   10 mg at 07/11/17 1010  . aspirin EC tablet 81 mg  81 mg Oral Daily Sherral Hammers,  Geraldo Docker, MD   81 mg at 07/11/17 1014  . atorvastatin (LIPITOR) tablet 40 mg  40 mg Oral QHS Rai, Ripudeep K, MD   40 mg at 07/10/17 2157  . brimonidine (ALPHAGAN) 0.15 % ophthalmic solution 1 drop  1 drop Both Eyes BID Rai, Ripudeep K, MD   1 drop at 07/11/17 1015  . dorzolamide-timolol (COSOPT) 22.3-6.8 MG/ML ophthalmic solution 1 drop  1 drop Both Eyes BID Rai, Ripudeep K, MD   1 drop at 07/11/17 1015  . fluticasone (FLONASE) 50 MCG/ACT nasal spray 2 spray  2 spray Each Nare Daily PRN Rai, Ripudeep K, MD      . folic acid (FOLVITE) tablet 1 mg  1 mg Oral Daily Kris Mouton, RPH   1 mg at  07/11/17 1014  . heparin injection 5,000 Units  5,000 Units Subcutaneous Q8H Allie Bossier, MD   5,000 Units at 07/10/17 2157  . hydrALAZINE (APRESOLINE) tablet 50 mg  50 mg Oral TID Rai, Vernelle Emerald, MD   50 mg at 07/11/17 1014  . latanoprost (XALATAN) 0.005 % ophthalmic solution 1 drop  1 drop Both Eyes QHS Rai, Ripudeep K, MD   1 drop at 07/10/17 2258  . LORazepam (ATIVAN) injection 1-2 mg  1-2 mg Intravenous Q2H PRN Allie Bossier, MD   1 mg at 07/09/17 1521  . MEDLINE mouth rinse  15 mL Mouth Rinse BID Rai, Ripudeep K, MD   15 mL at 07/10/17 2200  . metoprolol tartrate (LOPRESSOR) tablet 25 mg  25 mg Oral BID Rai, Ripudeep K, MD   25 mg at 07/11/17 1010  . midazolam (VERSED) injection 4 mg  4 mg Intravenous Once Carmin Muskrat, MD      . multivitamins with iron tablet   Oral Daily Rai, Ripudeep K, MD   1 tablet at 07/11/17 1015  . ondansetron (ZOFRAN) tablet 4 mg  4 mg Oral Q6H PRN Doutova, Anastassia, MD       Or  . ondansetron (ZOFRAN) injection 4 mg  4 mg Intravenous Q6H PRN Doutova, Anastassia, MD      . pantoprazole (PROTONIX) EC tablet 40 mg  40 mg Oral Daily Rai, Ripudeep K, MD   40 mg at 07/11/17 1014  . thiamine (VITAMIN B-1) tablet 100 mg  100 mg Oral Daily Kris Mouton, RPH   100 mg at 07/11/17 1014  . valproate (DEPACON) 500 mg in dextrose 5 % 50 mL IVPB  500 mg Intravenous Q12H Marliss Coots, PA-C 55 mL/hr at 07/11/17 1014 500 mg at 07/11/17 1014     Discharge Medications: Please see discharge summary for a list of discharge medications.  Relevant Imaging Results:  Relevant Lab Results:   Additional Information SS#: [id number]  Geralynn Ochs, LCSW

## 2017-07-11 NOTE — Progress Notes (Signed)
  Speech Language Pathology Treatment: Dysphagia  Patient Details Name: Darrell [name] MRN: [id number] DOB: 01-06-1951 Today's Date: 07/11/2017 Time: 1497-0263 SLP Time Calculation (min) (ACUTE ONLY): 13 min  Assessment / Plan / Recommendation Clinical Impression  Pt recalled recommendations from [facility]BS on previous date with Min cues. Wife believes that coughing associated with meals has reduced today with implementation of swallowing strategies. Pt consumed applesauce and water with Mod cues for smaller bites/sips, slower pacing. He had one cough that immediate followed a large straw sip of water, which was consumed right after several bites of puree. Although aspiration on testing yesterday was silent, it is still possible that he had some reduced airway protection associated with this episode. His voice remained clear though and no other overt signs of difficulty were observed. Would continue current diet and precautions.   HPI HPI: 66 year old history of prior CVA, CK D, dysphagia, GERD, HLD, HTN presents with new onset seizures. Treated with Versed and then loaded with Depakote in ER. ER M.D. discuss case with neurology who recommends admission to medicine for EEG in a.m. recommended initially MRI unable to obtain as patient does have history of pacemaker.  CT head unremarkable.        SLP Plan  Continue with current plan of care       Recommendations  Diet recommendations: Dysphagia 2 (fine chop);Thin liquid Liquids provided via: Cup;Straw Medication Administration: Crushed with puree Supervision: Staff to assist with self feeding;Full supervision/cueing for compensatory strategies Compensations: Slow rate;Small sips/bites;Minimize environmental distractions;Lingual sweep for clearance of pocketing;Clear throat intermittently;Other (Comment) (no mixed consistencies) Postural Changes and/or Swallow Maneuvers: Seated upright 90 degrees                Oral Care Recommendations: Oral  care BID Follow up Recommendations: Inpatient Rehab SLP Visit Diagnosis: Dysphagia, oropharyngeal phase (R13.12) Plan: Continue with current plan of care       GO                Darrell [name] 07/11/2017, 3:54 PM  Darrell [name], M.A. CCC-SLP [phone]

## 2017-07-11 NOTE — Progress Notes (Signed)
Physical Therapy Treatment Patient Details Name: Darrell [name] MRN: [id number] DOB: 01-Aug-1951 Today's Date: 07/11/2017    History of Present Illness Pt is a 66 y/o male with a PMH significant forCVA with residual dysphagia, Neuromuscular disorder(chronic inflammatory demyelinating polyneuropathy) HTN,Symptomatic Bradycardia S/P pacemaker 12/2016, HLD, tobacco abuse, and CKD stage 3. Presented by EMS after witnessed seizure,withaltered mental status(postictal).    PT Comments    Pt with improved ambulation tolerance but con't to require maxAx2 for transfers and OOB mobility. Pt with noted ataxia and delayed processing/impaired sequencing. Pt to benefit from aggressive PT program to achieve maximal functional recovery. Acute PT to follow.    Follow Up Recommendations  CIR;Supervision/Assistance - 24 hour     Equipment Recommendations  None recommended by PT    Recommendations for Other Services       Precautions / Restrictions Precautions Precautions: Fall;ICD/Pacemaker Restrictions Weight Bearing Restrictions: No    Mobility  Bed Mobility Overal bed mobility: Needs Assistance Bed Mobility: Supine to Sit     Supine to sit: Min assist     General bed mobility comments: pt rolled to left via reaching for handrail. Pt pt able to bring self 1/2 way up to sitting then required minA to achieve full upright position and to scoot to EOB  Transfers Overall transfer level: Needs assistance Equipment used: 2 person hand held assist Transfers: Sit to/from Stand Sit to Stand: Mod assist;+2 safety/equipment;From elevated surface Stand pivot transfers: Max assist;+2 physical assistance       General transfer comment: pt requires max directional v/c's to complete task safely and to advance LEs to complete std pvt to chair. modA at hips to promote appropriate weight-shifting  Ambulation/Gait Ambulation/Gait assistance: Max assist;+2 physical assistance Ambulation Distance (Feet):  10 Feet Assistive device: 2 person hand held assist Gait Pattern/deviations: Step-through pattern;Decreased stride length;Ataxic;Narrow base of support;Staggering left;Staggering right Gait velocity: decreased Gait velocity interpretation: Below normal speed for age/gender General Gait Details: pt with noted ataxia, modA for weight shifting at hips and max directional v/c's for sequencing stepping pattern. pt able to clear L foot but had a harder time with R foot   Stairs            Wheelchair Mobility    Modified Rankin (Stroke Patients Only) Modified Rankin (Stroke Patients Only) Pre-Morbid Rankin Score: Moderate disability Modified Rankin: Moderately severe disability     Balance Overall balance assessment: Needs assistance Sitting-balance support: No upper extremity supported;Feet supported Sitting balance-Leahy Scale: Fair     Standing balance support: Bilateral upper extremity supported;During functional activity Standing balance-Leahy Scale: Poor Standing balance comment: Required UE support and physical A to maintain standing balance                            Cognition Arousal/Alertness: Awake/alert Behavior During Therapy: Impulsive Overall Cognitive Status: Impaired/Different from baseline Area of Impairment: Attention;Memory;Following commands;Safety/judgement;Awareness;Problem solving                   Current Attention Level: Sustained Memory: Decreased short-term memory Following Commands: Follows one step commands with increased time Safety/Judgement: Decreased awareness of safety Awareness: Emergent Problem Solving: Slow processing;Requires verbal cues;Requires tactile cues;Difficulty sequencing        Exercises      General Comments        Pertinent Vitals/Pain Pain Assessment: No/denies pain    Home Living  Prior Function            PT Goals (current goals can now be found in the care  plan section) Progress towards PT goals: Progressing toward goals    Frequency    Min 3X/week      PT Plan Current plan remains appropriate    Co-evaluation              AM-PAC PT "6 Clicks" Daily Activity  Outcome Measure  Difficulty turning over in bed (including adjusting bedclothes, sheets and blankets)?: Unable Difficulty moving from lying on back to sitting on the side of the bed? : Unable Difficulty sitting down on and standing up from a chair with arms (e.g., wheelchair, bedside commode, etc,.)?: Unable Help needed moving to and from a bed to chair (including a wheelchair)?: A Lot Help needed walking in hospital room?: A Lot Help needed climbing 3-5 steps with a railing? : A Lot 6 Click Score: 9    End of Session Equipment Utilized During Treatment: Gait belt Activity Tolerance: Patient tolerated treatment well Patient left: in chair;with call bell/phone within reach;with chair alarm set;with family/visitor present Nurse Communication: Mobility status PT Visit Diagnosis: Other symptoms and signs involving the nervous system (R29.898);Difficulty in walking, not elsewhere classified (R26.2);Apraxia (R48.2);Unsteadiness on feet (R26.81)     Time: 1610-9604 PT Time Calculation (min) (ACUTE ONLY): 20 min  Charges:  $Gait Training: 8-22 mins                    G Codes:       Kittie Plater, PT, DPT Pager #: [phone] Office #: [phone]    [employer] 07/11/2017, 2:57 PM

## 2017-07-11 NOTE — Clinical Social Work Note (Signed)
Clinical Social Work Assessment  Patient Details  Name: Darrell [name] MRN: [id number] Date of Birth: [birth year]  Date of referral:  07/11/17               Reason for consult:  Facility Placement                Permission sought to share information with:  Facility Sport and exercise psychologist, Family Supports Permission granted to share information::  Yes, Verbal Permission Granted  Name::     Haematologist::  SNF  Relationship::  Wife  Contact Information:     Housing/Transportation Living arrangements for the past 2 months:  Single Family Home Source of Information:  Spouse Patient Interpreter Needed:  None Criminal Activity/Legal Involvement Pertinent to Current Situation/Hospitalization:  No - Comment as needed Significant Relationships:  Spouse, Adult Children, Other Family Members Lives with:  Self, Spouse Do you feel safe going back to the place where you live?  Yes Need for family participation in patient care:  Yes (Comment) (patient had covered his head with a sheet and refused to engage in discussion)  Care giving concerns:  Patient currently lives at home with spouse, but the wife is not able to provide the support that he needs at this time and he will benefit from short term rehab prior to returning home.   Social Worker assessment / plan:  CSW met with patient and patient's wife at bedside. CSW discussed SNF recommendation and discussed referral process. CSW provided list of bed offers to patient's wife and discussed options. CSW informed patient's wife to discuss with the patient and call back with preferences. CSW checked in with facility preference and determined bed offer. CSW has initiated Ship broker.  Employment status:  Retired Nurse, adult PT Recommendations:  Inpatient [place] / Referral to community resources:  [place]  Patient/Family's Response to care:  Patient would prefer to go  home, but is aware that he will benefit from rehab and the wife cannot care for him at this time. Patient's wife is agreeable to SNF placement.  Patient/Family's Understanding of and Emotional Response to Diagnosis, Current Treatment, and Prognosis:  Patient and patient's wife are appreciative of CSW assistance. Patient's wife discussed concern with how the patient had been becoming weaker lately and feels that this hospitalization has been helpful in getting the care and resources that she needs in order to get him back on his feet. Patient was pretending to be asleep during the discussion and had a sheet on over his head, but yelled out that he wanted to go home at the end of the discussion. Patient's wife reminded him that he needed rehab for a little while and then could go home.  Emotional Assessment Appearance:  Appears stated age Attitude/Demeanor/Rapport:  Unable to Assess Affect (typically observed):  Unable to Assess Orientation:  Oriented to Self, Oriented to Place, Oriented to  Time, Oriented to Situation Alcohol / Substance use:  Not Applicable Psych involvement (Current and /or in the community):  No (Comment)  Discharge Needs  Concerns to be addressed:  Care Coordination Readmission within the last 30 days:  No Current discharge risk:  Physical Impairment Barriers to Discharge:  Ship broker, Continued Medical Work up   [employer], [place] 07/11/2017, 4:14 PM

## 2017-07-12 LAB — BASIC METABOLIC PANEL
ANION GAP: 8 (ref 5–15)
BUN: 13 mg/dL (ref 6–20)
CALCIUM: 9.3 mg/dL (ref 8.9–10.3)
CO2: 20 mmol/L — ABNORMAL LOW (ref 22–32)
Chloride: 111 mmol/L (ref 101–111)
Creatinine, Ser: 1.84 mg/dL — ABNORMAL HIGH (ref 0.61–1.24)
GFR, EST AFRICAN AMERICAN: 43 mL/min — AB (ref >60)
GFR, EST NON AFRICAN AMERICAN: 37 mL/min — AB (ref >60)
GLUCOSE: 78 mg/dL (ref 65–99)
POTASSIUM: 4.5 mmol/L (ref 3.5–5.1)
SODIUM: 139 mmol/L (ref 135–145)

## 2017-07-12 LAB — CBC
HCT: 34.9 % — ABNORMAL LOW (ref 39.0–52.0)
HEMOGLOBIN: 11.3 g/dL — AB (ref 13.0–17.0)
MCH: 29.2 pg (ref 26.0–34.0)
MCHC: 32.4 g/dL (ref 30.0–36.0)
MCV: 90.2 fL (ref 78.0–100.0)
Platelets: 242 10*3/uL (ref 150–400)
RBC: 3.87 MIL/uL — ABNORMAL LOW (ref 4.22–5.81)
RDW: 14.4 % (ref 11.5–15.5)
WBC: 7.4 10*3/uL (ref 4.0–10.5)

## 2017-07-12 LAB — GLUCOSE, CAPILLARY: GLUCOSE-CAPILLARY: 98 mg/dL (ref 65–99)

## 2017-07-12 MED ORDER — THIAMINE HCL 100 MG PO TABS: 100.0000 mg | ORAL_TABLET | Freq: Every day | ORAL | 0 refills | Status: DC

## 2017-07-12 MED ORDER — FOLIC ACID 1 MG PO TABS: 1.0000 mg | ORAL_TABLET | Freq: Every day | ORAL | 0 refills | Status: DC

## 2017-07-12 MED ORDER — DIVALPROEX SODIUM 500 MG PO DR TAB: 500.0000 mg | DELAYED_RELEASE_TABLET | Freq: Two times a day (BID) | ORAL | 0 refills | Status: DC

## 2017-07-12 NOTE — Progress Notes (Signed)
Discharge instructions reviewed with pt and copy sent with EMS to give wife at facility. Discharged to [facility] in stable condition.

## 2017-07-12 NOTE — Progress Notes (Signed)
Telephone report called to [facility] and given to [place], [place]. All questions answered and addressed. Pt's belongings sent with wife. Preparing for discharge at this time.

## 2017-07-12 NOTE — Clinical Social Work Placement (Signed)
Nurse to call report to [phone], Room [place]  NOTE  Date:  07/12/2017  Patient Details  Name: Darrell [name] MRN: [id number] Date of Birth: 09-Dec-1950  Clinical Social Work is seeking post-discharge placement for this patient at the [place] level of care (*CSW will initial, date and re-position this form in  chart as items are completed):      Patient/family provided with [place] Work Department's list of facilities offering this level of care within the geographic area requested by the patient (or if unable, by the patient's family).      Patient/family informed of their freedom to choose among providers that offer the needed level of care, that participate in Medicare, Medicaid or managed care program needed by the patient, have an available bed and are willing to accept the patient.      Patient/family informed of [facility]'s ownership interest in [facility] and [facility], as well as of the fact that they are under no obligation to receive care at these facilities.  PASRR submitted to EDS on 07/11/17     PASRR number received on 07/11/17     Existing PASRR number confirmed on       FL2 transmitted to all facilities in geographic area requested by pt/family on       FL2 transmitted to all facilities within larger geographic area on 07/11/17     Patient informed that his/her managed care company has contracts with or will negotiate with certain facilities, including the following:        Yes   Patient/family informed of bed offers received.  Patient chooses bed at [facility] and Rehab     Physician recommends and patient chooses bed at      Patient to be transferred to [facility] and Rehab on 07/12/17.  Patient to be transferred to facility by PTAR     Patient family notified on 07/12/17 of transfer.  Name of family member notified:  [name]     PHYSICIAN        Additional Comment:    _______________________________________________ Geralynn Ochs, [place] 07/12/2017, 2:18 PM

## 2017-07-12 NOTE — Care Management Important Message (Signed)
Important Message  Patient Details  Name: Darrell [name] MRN: [id number] Date of Birth: 12/31/1950   Medicare Important Message Given:  Yes    [name] May 07/12/2017, 12:17 PM

## 2017-07-12 NOTE — Care Management Note (Signed)
Case Management Note  Patient Details  Name: Darrell [name] MRN: [id number] Date of Birth: 13-Sep-1951  Subjective/Objective:                    Action/Plan: Pt discharging to [facility] today. No further needs per CM.   Expected Discharge Date:  07/12/17               Expected Discharge Plan:  Skilled Nursing Facility  In-House Referral:  Clinical Social Work  Discharge planning Services     Post Acute Care Choice:    Choice offered to:     DME Arranged:    DME Agency:     HH Arranged:    [place] Agency:     Status of Service:  Completed, signed off  If discussed at [employer], dates discussed:    Additional Comments:  Pollie Friar, RN 07/12/2017, 2:35 PM

## 2017-07-12 NOTE — Discharge Instructions (Signed)
Seizure, Adult A seizure is a sudden burst of abnormal electrical activity in the brain. The abnormal activity temporarily interrupts normal brain function, causing a person to experience any of the following:  Involuntary movements.  Changes in awareness or consciousness.  Uncontrollable shaking (convulsions).  Seizures usually last from 30 seconds to 2 minutes. They usually do not cause permanent brain damage unless they are prolonged. What can cause a seizure to happen? Seizures can happen for many reasons including:  A fever.  Low blood sugar.  A medicine.  An illnesses.  A brain injury.  Some people who have a seizure never have another one. People who have repeated seizures have a condition called epilepsy. What are the symptoms of a seizure? Symptoms of a seizure vary greatly from person to person. They include:  Convulsions.  Stiffening of the body.  Involuntary movements of the arms or legs.  Loss of consciousness.  Breathing problems.  Falling suddenly.  Confusion.  Head nodding.  Eye blinking or fluttering.  Lip smacking.  Drooling.  Rapid eye movements.  Grunting.  Loss of bladder control and bowel control.  Staring.  Unresponsiveness.  Some people have symptoms right before a seizure happens (aura) and right after a seizure happens. Symptoms of an aura include:  Fear or anxiety.  Nausea.  Feeling like the room is spinning (vertigo).  A feeling of having seen or heard something before (deja vu).  Odd tastes or smells.  Changes in vision, such as seeing flashing lights or spots.  Symptoms that may follow a seizure include:  Confusion.  Sleepiness.  Headache.  Weakness of one side of the body.  Follow these instructions at home: Medicines   Take over-the-counter and prescription medicines only as told by your health care provider.  Avoid any substances that may prevent your medicine from working properly, such as  alcohol. Activity  Do not drive, swim, or do any other activities that would be dangerous if you had another seizure. Wait until your health care provider approves.  If you live in the [place] check with your local DMV (department of motor vehicles) to find out about the local driving laws. Each state has specific rules about when you can legally return to driving.  Get enough rest. Lack of sleep can make seizures more likely to occur. Educating others Teach friends and family what to do if you have a seizure. They should:  Lay you on the ground to prevent a fall.  Cushion your head and body.  Loosen any tight clothing around your neck.  Turn you on your side. If vomiting occurs, this helps keep your airway clear.  Stay with you until you recover.  Not hold you down. Holding you down will not stop the seizure.  Not put anything in your mouth.  Know whether or not you need emergency care.  General instructions  Contact your health care provider each time you have a seizure.  Avoid anything that has ever triggered a seizure for you.  Keep a seizure diary. Record what you remember about each seizure, especially anything that might have triggered the seizure.  Keep all follow-up visits as told by your health care provider. This is important. Contact a health care provider if:  You have another seizure.  You have seizures more often.  Your seizure symptoms change.  You continue to have seizures with treatment.  You have symptoms of an infection or illness. They might increase your risk of having a seizure. Get help  right away if:  You have a seizure: ? That lasts longer than 5 minutes. ? That is different than previous seizures. ? That leaves you unable to speak or use a part of your body. ? That makes it harder to breathe. ? After a head injury.  You have: ? Multiple seizures in a row. ? Confusion or a severe headache right after a seizure.  You are having  seizures more often.  You do not wake up immediately after a seizure.  You injure yourself during a seizure. These symptoms may represent a serious problem that is an emergency. Do not wait to see if the symptoms will go away. Get medical help right away. Call your local emergency services (911 in the [place] Do not drive yourself to the hospital. This information is not intended to replace advice given to you by your health care provider. Make sure you discuss any questions you have with your health care provider. Document Released: 09/08/2000 Document Revised: 05/07/2016 Document Reviewed: 04/14/2016 Elsevier Interactive Patient Education  2017 [employer].

## 2017-07-12 NOTE — Progress Notes (Signed)
CSW following up with [facility]) to ensure that case has been received and is being worked on. Representative with [facility] indicated that the case is still pending, and will contact CSW with decision.  CSW will update with insurance information when received.  Laveda Abbe, [place] Clinical Social Worker [phone]

## 2017-07-12 NOTE — Discharge Summary (Signed)
Physician Discharge Summary  PJ ZEHNER ZOX:[id number] DOB: 04-28-1951 DOA: 07/07/2017  PCP: Boykin Nearing, MD  Admit date: 07/07/2017 Discharge date: 07/12/2017  Time spent: 45 minutes  Recommendations for Outpatient Follow-up:  Patient will be discharged to skilled nursing facility.  Patient will need to follow up with primary care provider within one week of discharge.  Follow up with Dr. Corwin Levins, neurology.  Patient should continue medications as prescribed.  Patient should follow a heart healthy diet.   Discharge Diagnoses:  New onset seizure in the setting of prior CVA Essential hypertension History of CVA with residual deficit Chronic kidney disease, stage III History of bradycardia  Discharge Condition: Stable   Diet recommendation: heart healthy  Filed Weights   07/07/17 2053  Weight: 81.6 kg (180 lb)    History of present illness:  On 07/08/2017 by Dr. Dia Crawford Zettie Pho a 66 y.o.BM PMHxCVA with residual dysphagia, Neuromuscular disorder(chronic inflammatory demyelinating polyneuropathy) HTN,Symptomatic Bradycardia S/P pacemaker 12/2016, HLD, tobacco abuse, CKD stage 3  Presented by EMS after witnessed seizure,withaltered mental status(postictal) .In route the patient continued to have seizure-like activity, and received Versed, 5 mg per This seemed to slow the seizure activity, though did not cease entirely.The patient himself has a nasal trumpet in place, is actively receiving supplemental oxygen, his not responding to verbal stimuli, level 5 caveatsecondary to change in mental status. No previous history of seizure. Uses walker at home for ambulation. Per wife patient had seizure activity for ~10-15 minutes which spontaneously resolved just prior to EMS arriving on seen.states negative loss of bowel or bladder control. No new changes to medication. Does not drink. Per [facility] case discussed with Neurology who recommended EEG  Hospital  Course:  New onset seizure in the setting of prior CVA -EEG showed evidence of mild generalized slowing of brain consistent with postictal state however no epileptiform discharges or seizures -Neurology consulted and appreciated, recommended MRI of the brain and C-spine however patient cannot complete MRI due to agitation -MRI brain showed no acute stroke -Patient was placed on Keppra 07/09/2017 however appeared to be much more drowsy and was transitioned to Depakote -Patient to have MRI C-spine as outpatient -He does follow with Dr. Corwin Levins for demyelinating disorder/CIDP  Essential hypertension -BP stable, continue amlodipine, hydralazine, metoprolol -resume torsemide and lisinopril on discharge- monitor BP and BMP closely   History of CVA with residual deficit -Continue aspirin, statin -SNF placement  Chronic kidney disease, stage III -Baseline creatinine approximately 2, currently 1.84  History of bradycardia -Status post pacemaker placement  Procedures: EEG  Consultations: Neurology   Discharge Exam: Vitals:   07/12/17 1011 07/12/17 1340  BP: 134/69 (!) 142/80  Pulse: 66 60  Resp: 18 18  Temp: 97.8 F (36.6 C) 98.6 F (37 C)  SpO2: 100% 100%   Patient feeling better today. Denies chest pain, shortness of breath, abdominal pain, nausea, vomiting, diarrhea, constipation, dizziness, headache. Wonders when he can go home, but understands he needs to go to rehab first.    General: Well developed, well nourished, NAD, appears stated age  16: NCAT, mucous membranes moist.  Cardiovascular: S1 S2 auscultated, RRR, no murmurs   Respiratory: Clear to auscultation bilaterally with equal chest rise  Abdomen: Soft, nontender, nondistended, + bowel sounds  Extremities: warm dry without cyanosis clubbing or edema  Neuro: AAOx3, LUE weakness 4/5, otherwise nonfocal  Psych: Normal affect and demeanor, pleasant   Discharge Instructions Discharge Instructions      Discharge instructions  Complete by:  As directed    Patient will be discharged to skilled nursing facility.  Patient will need to follow up with primary care provider within one week of discharge.  Follow up with Dr. Corwin Levins,, neurology.  Patient should continue medications as prescribed.  Patient should follow a heart healthy diet.     Current Discharge Medication List    START taking these medications   Details  divalproex (DEPAKOTE) 500 MG DR tablet Take 1 tablet (500 mg total) by mouth 2 (two) times daily. Qty: 60 tablet, Refills: 0    folic acid (FOLVITE) 1 MG tablet Take 1 tablet (1 mg total) by mouth daily. Qty: 30 tablet, Refills: 0    thiamine 100 MG tablet Take 1 tablet (100 mg total) by mouth daily. Qty: 30 tablet, Refills: 0      CONTINUE these medications which have NOT CHANGED   Details  amLODipine (NORVASC) 10 MG tablet Take 1 tablet (10 mg total) by mouth every morning. Qty: 90 tablet, Refills: 0   Associated Diagnoses: Essential hypertension    aspirin EC 81 MG tablet Take 1 tablet (81 mg total) by mouth daily. Qty: 90 tablet, Refills: 3   Associated Diagnoses: History of CVA with residual deficit    atorvastatin (LIPITOR) 40 MG tablet Take 1 tablet (40 mg total) by mouth daily. Qty: 90 tablet, Refills: 0   Associated Diagnoses: History of CVA with residual deficit    brimonidine (ALPHAGAN P) 0.1 % SOLN Place 1 drop into both eyes 2 (two) times daily.    dorzolamide-timolol (COSOPT) 22.3-6.8 MG/ML ophthalmic solution Place 1 drop into both eyes 2 (two) times daily.    esomeprazole (NEXIUM) 20 MG capsule Take 20 mg by mouth daily at 12 noon.    fluticasone (FLONASE) 50 MCG/ACT nasal spray Place 2 sprays into both nostrils daily. Qty: 16 g, Refills: 6   Associated Diagnoses: Acute non-recurrent frontal sinusitis    hydrALAZINE (APRESOLINE) 50 MG tablet Take 1 tablet (50 mg total) by mouth 3 (three) times daily. Qty: 270 tablet, Refills: 0    Associated Diagnoses: Essential hypertension    ketoconazole (NIZORAL) 2 % cream Apply 1 application topically daily as needed for irritation.    latanoprost (XALATAN) 0.005 % ophthalmic solution Place 1 drop into both eyes at bedtime.    lisinopril (PRINIVIL,ZESTRIL) 20 MG tablet Take 1 tablet (20 mg total) by mouth daily. Qty: 90 tablet, Refills: 0   Associated Diagnoses: Essential hypertension    metoprolol tartrate (LOPRESSOR) 25 MG tablet Take 1 tablet (25 mg total) by mouth 2 (two) times daily. Qty: 180 tablet, Refills: 3    Multiple Vitamins-Iron (DAILY VITAMIN FORMULA+IRON) TABS Take 1 tablet by mouth daily.    Psyllium (EQ DAILY FIBER PO) Take 1 tablet by mouth daily.    terbinafine (LAMISIL AT) 1 % cream Apply 1 application topically 2 (two) times daily. To R foot Qty: 30 g, Refills: 0   Associated Diagnoses: Tinea pedis of right foot    torsemide (DEMADEX) 20 MG tablet Take 20 mg by mouth daily.       Allergies  Allergen Reactions  . Doxycycline Other (See Comments)    Hiccups, cough, nausea and emesis, elevated liver enzymes, elevated eosinophils, SOB concerning for early DRESS syndrome   . Atacand Hct [Candesartan Cilexetil-Hctz] Hives  . Shellfish Allergy Hives   Follow-up Information    Boykin Nearing, MD. Schedule an appointment as soon as possible for a visit in 1 week(s).  Specialty:  Family Medicine Why:  Hospital follow up       Penni Bombard, MD. Schedule an appointment as soon as possible for a visit in 2 week(s).   Specialties:  Neurology, Radiology Why:  Hospital follow up Contact information: [address] [place] [address] [phone]            The results of significant diagnostics from this hospitalization (including imaging, microbiology, ancillary and laboratory) are listed below for reference.    Significant Diagnostic Studies: Ct Head Wo Contrast  Result Date: 07/07/2017 CLINICAL DATA:  Acute onset  of seizure.  Initial encounter. EXAM: CT HEAD WITHOUT CONTRAST TECHNIQUE: Contiguous axial images were obtained from the base of the skull through the vertex without intravenous contrast. COMPARISON:  CT of the head performed 12/06/2016 FINDINGS: Brain: No evidence of acute infarction, hemorrhage, hydrocephalus, extra-axial collection or mass lesion/mass effect. Prominence of the ventricles and sulci reflects mild cortical volume loss. A chronic infarct noted at the right occipital lobe, with associated encephalomalacia. Diffuse periventricular and subcortical white matter likely reflects small vessel ischemic microangiopathy. Chronic lacunar infarcts are noted at the right basal ganglia and bilateral thalami. Cerebellar atrophy is noted. The brainstem and fourth ventricle are within normal limits. No mass effect or midline shift is seen. Vascular: No hyperdense vessel or unexpected calcification. Skull: There is no evidence of fracture; visualized osseous structures are unremarkable in appearance. Sinuses/Orbits: The orbits are within normal limits. A mucus retention cyst or polyp is noted at the left maxillary sinus. The remaining paranasal sinuses and mastoid air cells are well-aerated. Other: No significant soft tissue abnormalities are seen. IMPRESSION: 1. No acute intracranial pathology seen on CT. 2. Mild cortical volume loss and diffuse small vessel ischemic microangiopathy. 3. Chronic infarct at the right occipital lobe, with associated encephalomalacia. Chronic lacunar infarcts at the right basal ganglia and bilateral thalami. 4. Mucus retention cyst or polyp at the left maxillary sinus. Electronically Signed   By: Garald Balding M.D.   On: 07/07/2017 21:49   Mr Brain Wo Contrast (neuro Protocol)  Result Date: 07/09/2017 CLINICAL DATA:  Seizure and altered mental status EXAM: MRI HEAD WITHOUT CONTRAST TECHNIQUE: Multiplanar, multiecho pulse sequences of the brain and surrounding structures were  obtained without intravenous contrast. COMPARISON:  Head CT 07/07/2017 FINDINGS: The examination had to be discontinued prior to completion due to patient altered mental status and inability to cooperate with the technologist's instructions. Diffusion-weighted imaging shows no evidence of acute ischemia. There is advanced bilateral white matter hyperintensity consistent with chronic ischemic microangiopathy. Old right basal ganglia lacunar infarct. No midline shift or other mass effect. IMPRESSION: Truncated and motion degraded examination. Within that limitation, no acute ischemia or significant mass effect. Advanced chronic microvascular disease. Electronically Signed   By: Ulyses Jarred M.D.   On: 07/09/2017 16:41   Dg Chest Port 1 View  Result Date: 07/07/2017 CLINICAL DATA:  Initial evaluation for acute seizure. EXAM: PORTABLE CHEST 1 VIEW COMPARISON:  Prior radiograph from 01/17/2017. FINDINGS: Patient is rotated to the left. Cardiomegaly is unchanged. Mediastinal silhouette unchanged allowing for patient rotation. Aortic atherosclerosis. Left-sided Almyra Free transvenous pacemaker/ AICD noted. Defibrillator pad overlies left chest. Lungs hypoinflated. Mild perihilar vascular congestion without overt pulmonary edema. No focal infiltrates. No significant pleural effusion. No pneumothorax. No acute osseus abnormality.  Osteopenia noted. IMPRESSION: 1. Stable cardiomegaly with mild perihilar vascular congestion without pulmonary edema. 2. No other active cardiopulmonary disease. Electronically Signed   By: Jeannine Boga  M.D.   On: 07/07/2017 21:15   Dg Swallowing Func-speech Pathology  Result Date: 07/10/2017 Objective Swallowing Evaluation: Type of Study: MBS-Modified Barium Swallow Study Patient Details Name: LENELL LAMA MRN: [id number] Date of Birth: [birth year] Today's Date: 07/10/2017 Time: SLP Start Time (ACUTE ONLY): 1041-SLP Stop Time (ACUTE ONLY): 1107 SLP Time Calculation (min) (ACUTE ONLY):  26 min Past Medical History: Past Medical History: Diagnosis Date . Chronic kidney disease   stage 3 GFR 30-59 ml/min  . Dysphagia as late effect of cerebrovascular disease   pts wife states pt has to eat soft foods  . GERD (gastroesophageal reflux disease)  . Glaucoma  . High cholesterol  . Hypertension  . Neuromuscular disorder ([place])   chronic inflammatory demyelinating polyneuropathy  . Stroke [facility])   2011 with residual deficit left sided weakness . Tobacco dependence  Past Surgical History: Past Surgical History: Procedure Laterality Date . EYE SURGERY   . INGUINAL HERNIA REPAIR Right 11/23/2014  Procedure: right inguinal hernia repair with mesh;  Surgeon: Armandina Gemma, MD;  Location: [facility];  Service: General;  Laterality: Right; . INSERTION OF MESH N/A 11/23/2014  Procedure: INSERTION OF MESH;  Surgeon: Armandina Gemma, MD;  Location: [facility];  Service: General;  Laterality: N/A; . PACEMAKER IMPLANT N/A 01/16/2017  Procedure: Pacemaker Implant;  Surgeon: Will Meredith Leeds, MD;  Location: [place] [facility];  Service: Cardiovascular;  Laterality: N/A; . SHOULDER SURGERY Bilateral 1988, 5050 HPI: 66 year old history of prior CVA, CK D, dysphagia, GERD, HLD, HTN presents with new onset seizures. Treated with Versed and then loaded with Depakote in ER. ER M.D. discuss case with neurology who recommends admission to medicine for EEG in a.m. recommended initially MRI unable to obtain as patient does have history of pacemaker.  CT head unremarkable.   Subjective: pt drowsy this morning Assessment / Plan / Recommendation CHL IP CLINICAL IMPRESSIONS 07/10/2017 Clinical Impression Pt has a mild-moderate oropharyngeal dysphagia that seems relatively similar to previous MBS in 2015, although it is also impacted by pt's lethargy this morning (may be medication related per MD note). Pt has decreased oral cohesion with some lingual rocking, slow transit, and lingual residue mostly with solids. He has somewhat passive spillage into  his pharynx, with solids and liquids cascading from his base of tongue down into the pyriform sinuses before his swallow is triggered. Even with large, consecutive straw sips of thin liquids in isolation, he has only occasional, trace penetration that he clears with a cued throat clear. However, once pt has oral residue from solids in his mouth and along his base of tongue, it interferes with his ability to contain thin liquids, allowing them to spill silently into his trachea before the swallow. Recommend to continue current orders for Dsy 2 diet and thin liquids, but would avoid any mixed consistencies and would NOT use any liquid washes for clearing solids from his oral cavity. Purees and lingual sweeps could be used instead. His wife was present for study and education and verbalized her understanding as well. SLP also reinforced the importance of oral care to help reduce the risk of aspiration-related infections. SLP Visit Diagnosis Dysphagia, oropharyngeal phase (R13.12) Attention and concentration deficit following -- Frontal lobe and executive function deficit following -- Impact on safety and function Mild aspiration risk   CHL IP TREATMENT RECOMMENDATION 07/10/2017 Treatment Recommendations Therapy as outlined in treatment plan below   Prognosis 07/10/2017 Prognosis for Safe Diet Advancement Fair Barriers to Reach Goals Cognitive deficits;Time post onset Barriers/Prognosis Comment --  CHL IP DIET RECOMMENDATION 07/10/2017 SLP Diet Recommendations Dysphagia 2 (Fine chop) solids;Thin liquid Liquid Administration via Straw Medication Administration Whole meds with puree Compensations Slow rate;Small sips/bites;Minimize environmental distractions;Lingual sweep for clearance of pocketing;Clear throat intermittently Postural Changes Remain semi-upright after after feeds/meals (Comment);Seated upright at 90 degrees   CHL IP OTHER RECOMMENDATIONS 07/10/2017 Recommended Consults -- Oral Care Recommendations Oral  care BID Other Recommendations Have oral suction available   CHL IP FOLLOW UP RECOMMENDATIONS 07/10/2017 Follow up Recommendations Inpatient Rehab   CHL IP FREQUENCY AND DURATION 07/10/2017 Speech Therapy Frequency (ACUTE ONLY) min 2x/week Treatment Duration 2 weeks      CHL IP ORAL PHASE 07/10/2017 Oral Phase Impaired Oral - Pudding Teaspoon -- Oral - Pudding Cup -- Oral - Honey Teaspoon -- Oral - Honey Cup -- Oral - Nectar Teaspoon -- Oral - Nectar Cup -- Oral - Nectar Straw Weak lingual manipulation;Lingual pumping;Reduced posterior propulsion;Delayed oral transit;Decreased bolus cohesion Oral - Thin Teaspoon -- Oral - Thin Cup -- Oral - Thin Straw Weak lingual manipulation;Lingual pumping;Reduced posterior propulsion;Delayed oral transit;Decreased bolus cohesion Oral - Puree Weak lingual manipulation;Lingual pumping;Reduced posterior propulsion;Delayed oral transit;Decreased bolus cohesion Oral - Mech Soft Weak lingual manipulation;Lingual pumping;Reduced posterior propulsion;Delayed oral transit;Decreased bolus cohesion;Impaired mastication;Lingual/palatal residue Oral - Regular -- Oral - Multi-Consistency -- Oral - Pill -- Oral Phase - Comment --  CHL IP PHARYNGEAL PHASE 07/10/2017 Pharyngeal Phase Impaired Pharyngeal- Pudding Teaspoon -- Pharyngeal -- Pharyngeal- Pudding Cup -- Pharyngeal -- Pharyngeal- Honey Teaspoon -- Pharyngeal -- Pharyngeal- Honey Cup -- Pharyngeal -- Pharyngeal- Nectar Teaspoon -- Pharyngeal -- Pharyngeal- Nectar Cup -- Pharyngeal -- Pharyngeal- Nectar Straw Delayed swallow initiation-pyriform sinuses Pharyngeal -- Pharyngeal- Thin Teaspoon -- Pharyngeal -- Pharyngeal- Thin Cup -- Pharyngeal -- Pharyngeal- Thin Straw Delayed swallow initiation-pyriform sinuses;Penetration/Aspiration before swallow Pharyngeal Material enters airway, passes BELOW cords without attempt by patient to eject out (silent aspiration);Material enters airway, remains ABOVE vocal cords and not ejected out  Pharyngeal- Puree Delayed swallow initiation-pyriform sinuses Pharyngeal -- Pharyngeal- Mechanical Soft Delayed swallow initiation-pyriform sinuses Pharyngeal -- Pharyngeal- Regular -- Pharyngeal -- Pharyngeal- Multi-consistency -- Pharyngeal -- Pharyngeal- Pill -- Pharyngeal -- Pharyngeal Comment --  CHL IP CERVICAL ESOPHAGEAL PHASE 07/10/2017 Cervical Esophageal Phase WFL Pudding Teaspoon -- Pudding Cup -- Honey Teaspoon -- Honey Cup -- Nectar Teaspoon -- Nectar Cup -- Nectar Straw -- Thin Teaspoon -- Thin Cup -- Thin Straw -- Puree -- Mechanical Soft -- Regular -- Multi-consistency -- Pill -- Cervical Esophageal Comment -- CHL IP GO 11/05/2013 Functional Assessment Tool Used mbs, clinical judgement Functional Limitations Swallowing Swallow Current Status (J0093) CJ Swallow Goal Status (G1829) CJ Swallow Discharge Status (H3716) CJ Motor Speech Current Status (R6789) (None) Motor Speech Goal Status (F8101) (None) Motor Speech Goal Status (B5102) (None) Spoken Language Comprehension Current Status (H8527) (None) Spoken Language Comprehension Goal Status (P8242) (None) Spoken Language Comprehension Discharge Status (P5361) (None) Spoken Language Expression Current Status (W4315) (None) Spoken Language Expression Goal Status (Q0086) (None) Spoken Language Expression Discharge Status (P6195) (None) Attention Current Status (K9326) (None) Attention Goal Status (Z1245) (None) Attention Discharge Status (Y0998) (None) Memory Current Status (P3825) (None) Memory Goal Status (K5397) (None) Memory Discharge Status (Q7341) (None) Voice Current Status (P3790) (None) Voice Goal Status (W4097) (None) Voice Discharge Status (D5329) (None) Other Speech-Language Pathology Functional Limitation Current Status (J2426) (None) Other Speech-Language Pathology Functional Limitation Goal Status (S3419) (None) Other Speech-Language Pathology Functional Limitation Discharge Status [phone]) (None) [name] 07/10/2017, 12:12 PM  [name], M.A. CCC-SLP [phone]  Microbiology: No results found for this or any previous visit (from the past 240 hour(s)).   Labs: Basic Metabolic Panel:  Recent Labs Lab 07/07/17 2100 07/08/17 1328 07/09/17 0529 07/10/17 0603 07/11/17 0504 07/12/17 0521  NA 136 136 138 140 140 139  K 4.4 4.3 4.1 4.3 4.5 4.5  CL 104 108 108 112* 113* 111  CO2 20* 22 24 22 22  20*  GLUCOSE 101* 162* 84 72 81 78  BUN 17 18 17 14 12 13   CREATININE 1.85* 2.07* 1.92* 1.77* 1.83* 1.84*  CALCIUM 9.7 9.2 9.2 9.5 9.1 9.3  MG 2.0 1.8 1.6*  --   --   --   PHOS  --  2.6  --   --   --   --    Liver Function Tests:  Recent Labs Lab 07/07/17 2100 07/09/17 0529  AST 18 13*  ALT 6* 8*  ALKPHOS 95 81  BILITOT 0.4 0.7  PROT 6.5 5.5*  ALBUMIN 3.4* 2.9*   No results for input(s): LIPASE, AMYLASE in the last 168 hours. No results for input(s): AMMONIA in the last 168 hours. CBC:  Recent Labs Lab 07/07/17 2100 07/08/17 1328 07/10/17 0603 07/11/17 0504 07/12/17 0521  WBC 9.8 9.2 7.5 7.3 7.4  NEUTROABS 5.1  --   --   --   --   HGB 12.2* 11.1* 11.1* 11.1* 11.3*  HCT 38.5* 34.5* 34.9* 34.1* 34.9*  MCV 90.2 89.6 89.9 89.7 90.2  PLT 275 230 240 236 242   Cardiac Enzymes:  Recent Labs Lab 07/07/17 2100  TROPONINI <0.03   BNP: BNP (last 3 results) No results for input(s): BNP in the last 8760 hours.  ProBNP (last 3 results) No results for input(s): PROBNP in the last 8760 hours.  CBG:  Recent Labs Lab 07/12/17 1128  GLUCAP 98       Signed:  Shanzay Hepworth  [facility] 07/12/2017, 2:02 PM

## 2017-07-13 ENCOUNTER — Non-Acute Institutional Stay (SKILLED_NURSING_FACILITY): Payer: Medicare PPO | Admitting: Internal Medicine

## 2017-07-13 ENCOUNTER — Encounter: Payer: Self-pay | Admitting: Internal Medicine

## 2017-07-13 DIAGNOSIS — E785 Hyperlipidemia, unspecified: Secondary | ICD-10-CM | POA: Diagnosis not present

## 2017-07-13 DIAGNOSIS — Z720 Tobacco use: Secondary | ICD-10-CM | POA: Diagnosis not present

## 2017-07-13 DIAGNOSIS — I6930 Unspecified sequelae of cerebral infarction: Secondary | ICD-10-CM | POA: Diagnosis not present

## 2017-07-13 DIAGNOSIS — I10 Essential (primary) hypertension: Secondary | ICD-10-CM | POA: Diagnosis not present

## 2017-07-13 DIAGNOSIS — R001 Bradycardia, unspecified: Secondary | ICD-10-CM | POA: Diagnosis not present

## 2017-07-13 DIAGNOSIS — G6181 Chronic inflammatory demyelinating polyneuritis: Secondary | ICD-10-CM | POA: Diagnosis not present

## 2017-07-13 DIAGNOSIS — K219 Gastro-esophageal reflux disease without esophagitis: Secondary | ICD-10-CM

## 2017-07-13 DIAGNOSIS — R569 Unspecified convulsions: Secondary | ICD-10-CM

## 2017-07-13 DIAGNOSIS — Z9181 History of falling: Secondary | ICD-10-CM

## 2017-07-13 DIAGNOSIS — N183 Chronic kidney disease, stage 3 unspecified: Secondary | ICD-10-CM

## 2017-07-13 NOTE — Progress Notes (Signed)
: Provider:  Noah Delaine. Sheppard Coil, MD Location:  [place] Room Number: [place]:  SNF (31)  PCP: Boykin Nearing, MD Patient Care Team: Boykin Nearing, MD as PCP - General (Family Medicine) Leta Baptist, Earlean Polka, MD as Consulting Physician (Neurology)  Extended Emergency Contact Information Primary Emergency Contact: [facility] Address: [address]          [place], [place] [address] Johnnette Litter of [place] Phone: [phone] Mobile Phone: [phone] Relation: Spouse     Allergies: Doxycycline; Atacand hct [candesartan cilexetil-hctz]; and Shellfish allergy  Chief Complaint  Patient presents with  . New Admit To SNF    following hospitalization 07/07/17 to 07/12/17 new onset seizure    HPI: Patient is 66 y.o. male CVA with residual dysphasia, neuromuscular disorder (chronic inflammatory demyelinating polyneuropathy), hypertension, symptomatically bradycardia status post pacemaker 12/2016, hyperlipidemia, tobacco abuse, CK-MB stage III who presented by EMS after witnessed seizure with altered mental status/post ictal." The patient continued to have seizure-like activity and received Versed 5 mg which slowed the activity but did not stop it. Nasal trumpet was placed and patient is was oxygenated. Patient has no prior history of seizure.. Uses walker at home for ambulation. History is per wife who noted seizure activity for 10-15 minutes which spontaneously resolved just prior to EMS arriving on the scene. No loss of bowel or bladder. No changes in medication. Does not drink. EEG was consistent with postictal state. MRI brain showed no acute stroke. Patient was admitted to [facility] from 10/13-18 for new onset seizure in the setting of a prior CVA. Patient was placed on Keppra became drowsy and was transitioned to Depakote. BP remains stable and there were no further consultations. Patient is admitted to skilled nursing facility  with generalized weakness for OT/PT. While at skilled nursing facility patient will be followed for hypertension treated with Norvasc hydralazine metoprolol torsemide and lisinopril, history of CVA treated with aspirin and statin and hyperlipidemia treated with metoprolol.  Past Medical History:  Diagnosis Date  . At high risk for falls 08/16/2015  . Bradycardia   . Chronic kidney disease    stage 3 GFR 30-59 ml/min   . CIDP (chronic inflammatory demyelinating polyneuropathy) ([place])   . CKD (chronic kidney disease) stage 3, GFR 30-59 ml/min ([name]) 08/16/2015  . Dysphagia as late effect of cerebrovascular disease    pts wife states pt has to eat soft foods   . Elevated liver enzymes 08/10/2016  . GERD (gastroesophageal reflux disease)   . Glaucoma   . High cholesterol   . History of CVA with residual deficit 03/25/2013  . Hypertension   . Hypertensive retinopathy of both eyes 01/16/2017  . Inguinal hernia 03/25/2013  . Liver hemangioma 08/14/2016  . Neuromuscular disorder ([place])    chronic inflammatory demyelinating polyneuropathy   . New onset seizure ([place]) 07/08/2017  . Nuclear sclerosis of both eyes 10/25/2016  . Primary open angle glaucoma of both eyes, indeterminate stage 10/25/2016  . Renal mass, right 08/14/2016  . Status cardiac pacemaker 01/29/2017   Placed for second degree heart block on 01/16/17 Medtronic Azure XT DR MRI SureScan dual-chamber pacemaker  . Stroke [facility])    2011 with residual deficit left sided weakness  . Tobacco dependence     Past Surgical History:  Procedure Laterality Date  . EYE SURGERY    . INGUINAL HERNIA REPAIR Right 11/23/2014   Procedure: right inguinal hernia repair with mesh;  Surgeon: Armandina Gemma, MD;  Location: [facility] ORS;  Service: General;  Laterality: Right;  . INSERTION OF MESH N/A 11/23/2014   Procedure: INSERTION OF MESH;  Surgeon: Armandina Gemma, MD;  Location: [facility] ORS;  Service: General;  Laterality: N/A;  . PACEMAKER IMPLANT N/A 01/16/2017    Procedure: Pacemaker Implant;  Surgeon: Will Meredith Leeds, MD;  Location: [place] CV LAB;  Service: Cardiovascular;  Laterality: N/A;  . SHOULDER SURGERY Bilateral 1988, 1998    Allergies as of 07/13/2017      Reactions   Doxycycline Other (See Comments)   Hiccups, cough, nausea and emesis, elevated liver enzymes, elevated eosinophils, SOB concerning for early DRESS syndrome    Atacand Hct [candesartan Cilexetil-hctz] Hives   Shellfish Allergy Hives      Medication List       Accurate as of 07/13/17 11:59 PM. Always use your most recent med list.          amLODipine 10 MG tablet Commonly known as:  NORVASC Take 1 tablet (10 mg total) by mouth every morning.   aspirin EC 81 MG tablet Take 1 tablet (81 mg total) by mouth daily.   atorvastatin 40 MG tablet Commonly known as:  LIPITOR Take 1 tablet (40 mg total) by mouth daily.   brimonidine 0.1 % Soln Commonly known as:  ALPHAGAN P Place 1 drop into both eyes 2 (two) times daily.   DAILY VITAMIN FORMULA+IRON Tabs Take 1 tablet by mouth daily.   divalproex 500 MG DR tablet Commonly known as:  DEPAKOTE Take 1 tablet (500 mg total) by mouth 2 (two) times daily.   dorzolamide-timolol 22.3-6.8 MG/ML ophthalmic solution Commonly known as:  COSOPT Place 1 drop into both eyes 2 (two) times daily.   EQ DAILY FIBER PO Take 1 tablet by mouth daily.   esomeprazole 20 MG capsule Commonly known as:  NEXIUM Take 20 mg by mouth daily at 12 noon.   fluticasone 50 MCG/ACT nasal spray Commonly known as:  FLONASE Place 2 sprays into both nostrils daily.   folic acid 1 MG tablet Commonly known as:  FOLVITE Take 1 tablet (1 mg total) by mouth daily.   hydrALAZINE 50 MG tablet Commonly known as:  APRESOLINE Take 1 tablet (50 mg total) by mouth 3 (three) times daily.   ketoconazole 2 % cream Commonly known as:  NIZORAL Apply 1 application topically daily as needed for irritation.   latanoprost 0.005 % ophthalmic  solution Commonly known as:  XALATAN Place 1 drop into both eyes at bedtime.   lisinopril 20 MG tablet Commonly known as:  PRINIVIL,ZESTRIL Take 1 tablet (20 mg total) by mouth daily.   metoprolol tartrate 25 MG tablet Commonly known as:  LOPRESSOR Take 1 tablet (25 mg total) by mouth 2 (two) times daily.   terbinafine 1 % cream Commonly known as:  LAMISIL AT Apply 1 application topically 2 (two) times daily. To R foot   thiamine 100 MG tablet Take 1 tablet (100 mg total) by mouth daily.   torsemide 20 MG tablet Commonly known as:  DEMADEX Take 20 mg by mouth daily.       No orders of the defined types were placed in this encounter.   Immunization History  Administered Date(s) Administered  . Influenza,inj,Quad PF,6+ Mos 09/21/2014, 08/16/2015, 05/25/2016  . Pneumococcal Polysaccharide-23 09/21/2014  . Tdap 11/25/2015    Social History  Substance Use Topics  . Smoking status: Current Every Day Smoker    Packs/day: 1.00    Years: 40.00  Types: Cigarettes  . Smokeless tobacco: Never Used  . Alcohol use No     Comment: alcohol free for 1 year was drinking 1/2 pint per day     Family history is   Family History  Problem Relation Age of Onset  . Hypertension Mother   . Diabetes Sister   . Hypertension Sister       Review of Systems  DATA OBTAINED: from patient, nurse,Wife GENERAL:  no fevers, fatigue, appetite changes SKIN: No itching, or rash EYES: No eye pain, redness, discharge EARS: No earache, tinnitus, change in hearing NOSE: No congestion, drainage or bleeding  MOUTH/THROAT: No mouth or tooth pain, No sore throat RESPIRATORY: No cough, wheezing, SOB CARDIAC: No chest pain, palpitations, lower extremity edema  GI: No abdominal pain, No N/V/D or constipation, No heartburn or reflux  GU: No dysuria, frequency or urgency, or incontinence  MUSCULOSKELETAL: No unrelieved bone/joint pain NEUROLOGIC: No headache, dizziness; left-sided weakness,  chronic PSYCHIATRIC: No c/o anxiety or sadness   Vitals:   07/21/17 1504  BP: (!) 142/80  Pulse: 60  Resp: 18  Temp: 98.6 F (37 C)    SpO2 Readings from Last 1 Encounters:  07/12/17 100%   Body mass index is 21.91 kg/m.     Physical Exam  GENERAL APPEARANCE: Alert, moderately conversant,  No acute distress.  SKIN: No diaphoresis rash HEAD: Normocephalic, atraumatic  EYES: Conjunctiva/lids clear. Pupils round, reactive. EOMs intact.  EARS: External exam WNL, canals clear. Hearing grossly normal.  NOSE: No deformity or discharge.  MOUTH/THROAT: Lips w/o lesions  RESPIRATORY: Breathing is even, unlabored. Lung sounds are clear   CARDIOVASCULAR: Heart RRR no murmurs, rubs or gallops. No peripheral edema.   GASTROINTESTINAL: Abdomen is soft, non-tender, not distended w/ normal bowel sounds. GENITOURINARY: Bladder non tender, not distended  MUSCULOSKELETAL: No abnormal joints or musculature NEUROLOGIC:  Cranial nerves 2-12 grossly intact; 7 weakness PSYCHIATRIC: Mood and affect appropriate to situation, no behavioral issues  Patient Active Problem List   Diagnosis Date Noted  . CIDP (chronic inflammatory demyelinating polyneuropathy) ([place])   . Bradycardia   . Acute blood loss anemia   . New onset seizure ([place]) 07/08/2017  . Status cardiac pacemaker 01/29/2017  . Unintended weight loss 01/29/2017  . Tinea pedis of right foot 01/29/2017  . Hypertensive retinopathy of both eyes 01/16/2017  . Hypercalcemia 01/15/2017  . Heart block 01/15/2017  . Nuclear sclerosis of both eyes 10/25/2016  . Primary open angle glaucoma of both eyes, indeterminate stage 10/25/2016  . Constipation 09/12/2016  . Glaucoma 09/12/2016  . Liver hemangioma 08/14/2016  . Renal cyst 08/14/2016  . Renal mass, right 08/14/2016  . Elevated liver enzymes 08/10/2016  . CKD (chronic kidney disease) stage 3, GFR 30-59 ml/min ([name]) 08/16/2015  . At high risk for falls 08/16/2015  . Subcutaneous nodules  08/16/2015  . Prediabetes 10/07/2013  . Inguinal hernia 03/25/2013  . History of CVA with residual deficit 03/25/2013  . HTN (hypertension) 10/09/2012  . Tobacco abuse 10/09/2012      Labs reviewed: Basic Metabolic Panel:    Component Value Date/Time   NA 139 07/12/2017 0521   NA 138 02/09/2017 1117   K 4.5 07/12/2017 0521   CL 111 07/12/2017 0521   CO2 20 (L) 07/12/2017 0521   GLUCOSE 78 07/12/2017 0521   BUN 13 07/12/2017 0521   BUN 28 (H) 02/09/2017 1117   CREATININE 1.84 (H) 07/12/2017 0521   CREATININE 1.45 (H) 08/10/2016 1647   CALCIUM 9.3 07/12/2017  0521   PROT 5.5 (L) 07/09/2017 0529   PROT 7.1 10/20/2013 1443   ALBUMIN 2.9 (L) 07/09/2017 0529   AST 13 (L) 07/09/2017 0529   ALT 8 (L) 07/09/2017 0529   ALKPHOS 81 07/09/2017 0529   BILITOT 0.7 07/09/2017 0529   GFRNONAA 37 (L) 07/12/2017 0521   GFRNONAA 51 (L) 08/10/2016 1647   GFRAA 43 (L) 07/12/2017 0521   GFRAA 58 (L) 08/10/2016 1647     Recent Labs  07/07/17 2100 07/08/17 1328 07/09/17 0529 07/10/17 0603 07/11/17 0504 07/12/17 0521  NA 136 136 138 140 140 139  K 4.4 4.3 4.1 4.3 4.5 4.5  CL 104 108 108 112* 113* 111  CO2 20* 22 24 22 22  20*  GLUCOSE 101* 162* 84 72 81 78  BUN 17 18 17 14 12 13   CREATININE 1.85* 2.07* 1.92* 1.77* 1.83* 1.84*  CALCIUM 9.7 9.2 9.2 9.5 9.1 9.3  MG 2.0 1.8 1.6*  --   --   --   PHOS  --  2.6  --   --   --   --    Liver Function Tests:  Recent Labs  01/15/17 2348 07/07/17 2100 07/09/17 0529  AST 12* 18 13*  ALT 9* 6* 8*  ALKPHOS 80 95 81  BILITOT 0.7 0.4 0.7  PROT 6.5 6.5 5.5*  ALBUMIN 3.5 3.4* 2.9*   No results for input(s): LIPASE, AMYLASE in the last 8760 hours. No results for input(s): AMMONIA in the last 8760 hours. CBC:  Recent Labs  08/04/16 1558  02/09/17 1117 07/07/17 2100  07/10/17 0603 07/11/17 0504 07/12/17 0521  WBC 15.8*  < > 7.6 9.8  < > 7.5 7.3 7.4  NEUTROABS 11.8*  --  4.6 5.1  --   --   --   --   HGB 11.7*  < > 13.3 12.2*  < >  11.1* 11.1* 11.3*  HCT 34.7*  < > 39.7 38.5*  < > 34.9* 34.1* 34.9*  MCV 87.8  < > 85 90.2  < > 89.9 89.7 90.2  PLT 348  < > 309 275  < > 240 236 242  < > = values in this interval not displayed. Lipid No results for input(s): CHOL, HDL, LDLCALC, TRIG in the last 8760 hours.  Cardiac Enzymes:  Recent Labs  07/07/17 2100  TROPONINI <0.03   BNP: No results for input(s): BNP in the last 8760 hours. No results found for: [facility] Lab Results  Component Value Date   HGBA1C 5.3 10/07/2013   Lab Results  Component Value Date   TSH 0.476 07/08/2017   Lab Results  Component Value Date   AJOINOMV67 209 08/16/2015   No results found for: FOLATE No results found for: IRON, TIBC, FERRITIN  Imaging and Procedures obtained prior to SNF admission: Ct Head Wo Contrast  Result Date: 07/07/2017 CLINICAL DATA:  Acute onset of seizure.  Initial encounter. EXAM: CT HEAD WITHOUT CONTRAST TECHNIQUE: Contiguous axial images were obtained from the base of the skull through the vertex without intravenous contrast. COMPARISON:  CT of the head performed 12/06/2016 FINDINGS: Brain: No evidence of acute infarction, hemorrhage, hydrocephalus, extra-axial collection or mass lesion/mass effect. Prominence of the ventricles and sulci reflects mild cortical volume loss. A chronic infarct noted at the right occipital lobe, with associated encephalomalacia. Diffuse periventricular and subcortical white matter likely reflects small vessel ischemic microangiopathy. Chronic lacunar infarcts are noted at the right basal ganglia and bilateral thalami. Cerebellar atrophy is noted. The brainstem  and fourth ventricle are within normal limits. No mass effect or midline shift is seen. Vascular: No hyperdense vessel or unexpected calcification. Skull: There is no evidence of fracture; visualized osseous structures are unremarkable in appearance. Sinuses/Orbits: The orbits are within normal limits. A mucus retention cyst or  polyp is noted at the left maxillary sinus. The remaining paranasal sinuses and mastoid air cells are well-aerated. Other: No significant soft tissue abnormalities are seen. IMPRESSION: 1. No acute intracranial pathology seen on CT. 2. Mild cortical volume loss and diffuse small vessel ischemic microangiopathy. 3. Chronic infarct at the right occipital lobe, with associated encephalomalacia. Chronic lacunar infarcts at the right basal ganglia and bilateral thalami. 4. Mucus retention cyst or polyp at the left maxillary sinus. Electronically Signed   By: Garald Balding M.D.   On: 07/07/2017 21:49   Dg Chest Port 1 View  Result Date: 07/07/2017 CLINICAL DATA:  Initial evaluation for acute seizure. EXAM: PORTABLE CHEST 1 VIEW COMPARISON:  Prior radiograph from 01/17/2017. FINDINGS: Patient is rotated to the left. Cardiomegaly is unchanged. Mediastinal silhouette unchanged allowing for patient rotation. Aortic atherosclerosis. Left-sided [place] Free transvenous pacemaker/ AICD noted. Defibrillator pad overlies left chest. Lungs hypoinflated. Mild perihilar vascular congestion without overt pulmonary edema. No focal infiltrates. No significant pleural effusion. No pneumothorax. No acute osseus abnormality.  Osteopenia noted. IMPRESSION: 1. Stable cardiomegaly with mild perihilar vascular congestion without pulmonary edema. 2. No other active cardiopulmonary disease. Electronically Signed   By: Jeannine Boga M.D.   On: 07/07/2017 21:15     Not all labs, radiology exams or other studies done during hospitalization come through on my EPIC note; however they are reviewed by me.    Assessment and Plan   NEW ONSET in setting of prior CVA-EEG consistent with postictal state; MRI brain showed no acute stroke; patient placed on Keppra 10/15 but appeared to be much too drowsy and was transitioned to Depakote. SNF -admitted with generalized weakness for OT/PT; plan to continue Depakote 500 mg by mouth twice a  day  HISTORY OF CVA with residual deficit-left-sided weakness SNF - patient will be working with OT/PT. Plan to continue ASA 81 mg by mouth daily and Lipitor 40 mg by mouth daily  HYPERTENSION SNF -controlled; plan to continue Norvasc 10 mg daily, hydralazine 50 mg 3 times a day, lisinopril 20 mg daily, metoprolol 25 mg twice a day  CK D stage III-baseline creatinine around 2.0 SNF - discharge creatinine 1.84; will follow-up BMP  HISTORY OF BRADYCARDIA SNF -  status post pacemaker  HYPERLIPIDEMIA SNF - not stated as uncontrolled; plan to continue Lipitor 40 mg by mouth daily  GERD SNF - not stated as uncontrolled; continue Nexium 20 mg by mouth daily  Time spent greater than 45 minutes;> 50% of time with patient was spent reviewing records, labs, tests and studies, counseling and developing plan of care  Webb Silversmith D. Sheppard Coil, MD

## 2017-07-17 ENCOUNTER — Other Ambulatory Visit: Payer: Self-pay | Admitting: Pharmacist

## 2017-07-17 ENCOUNTER — Telehealth: Payer: Self-pay | Admitting: Cardiology

## 2017-07-17 ENCOUNTER — Encounter: Payer: Medicare PPO | Admitting: *Deleted

## 2017-07-17 DIAGNOSIS — I10 Essential (primary) hypertension: Secondary | ICD-10-CM

## 2017-07-17 MED ORDER — AMLODIPINE BESYLATE 10 MG PO TABS: 10.0000 mg | ORAL_TABLET | Freq: Every morning | ORAL | 0 refills | Status: DC

## 2017-07-17 NOTE — Telephone Encounter (Signed)
New message     Pt daughter is calling because pt is in rehab and she needs to rs his transmission.

## 2017-07-19 ENCOUNTER — Encounter: Payer: Self-pay | Admitting: Cardiology

## 2017-07-19 ENCOUNTER — Non-Acute Institutional Stay (SKILLED_NURSING_FACILITY): Payer: Medicare PPO | Admitting: Internal Medicine

## 2017-07-19 DIAGNOSIS — R6889 Other general symptoms and signs: Secondary | ICD-10-CM

## 2017-07-19 DIAGNOSIS — R399 Unspecified symptoms and signs involving the genitourinary system: Secondary | ICD-10-CM

## 2017-07-20 ENCOUNTER — Non-Acute Institutional Stay (SKILLED_NURSING_FACILITY): Payer: Medicare PPO | Admitting: Internal Medicine

## 2017-07-20 ENCOUNTER — Encounter: Payer: Self-pay | Admitting: Internal Medicine

## 2017-07-20 DIAGNOSIS — N3000 Acute cystitis without hematuria: Secondary | ICD-10-CM

## 2017-07-20 DIAGNOSIS — N179 Acute kidney failure, unspecified: Secondary | ICD-10-CM | POA: Diagnosis not present

## 2017-07-20 DIAGNOSIS — D72825 Bandemia: Secondary | ICD-10-CM | POA: Diagnosis not present

## 2017-07-20 NOTE — Progress Notes (Signed)
Location:  [facility] Room Number: [place]:  SNF [id number])  Provider: Noah Delaine. Sheppard Coil, MD  Boykin Nearing, MD  Patient Care Team: Boykin Nearing, MD as PCP - General (Family Medicine) Penni Bombard, MD as Consulting Physician (Neurology)  Extended Emergency Contact Information Primary Emergency Contact: [facility] Address: [address]          [place], [place] [address] Johnnette Litter of [place] Phone: [phone] Mobile Phone: [phone] Relation: Spouse    Allergies: Doxycycline; Atacand hct [candesartan cilexetil-hctz]; and Shellfish allergy  Chief Complaint  Patient presents with  . Acute Visit    patient "looks sick" per NH nurse    HPI: Patient is 66 y.o. male who who nursing asked me to see because patient looks sick". On my arrival patient is indeed look sick, temperature 99.8 pulse 102-107. Wife says he's had some feeling of urgency and dysuria and also an occasional cough. His O2 saturation on room air is 95%. He is eating and drinking, wife make sure he gets plenty of fluids, he likes water. Onset of this noted today. Nothing makes it better, nothing makes it worse.  Past Medical History:  Diagnosis Date  . At high risk for falls 08/16/2015  . Bradycardia   . Chronic kidney disease    stage 3 GFR 30-59 ml/min   . CIDP (chronic inflammatory demyelinating polyneuropathy) ([place])   . CKD (chronic kidney disease) stage 3, GFR 30-59 ml/min ([name]) 08/16/2015  . Dysphagia as late effect of cerebrovascular disease    pts wife states pt has to eat soft foods   . Elevated liver enzymes 08/10/2016  . GERD (gastroesophageal reflux disease)   . Glaucoma   . High cholesterol   . History of CVA with residual deficit 03/25/2013  . Hypertension   . Hypertensive retinopathy of both eyes 01/16/2017  . Inguinal hernia 03/25/2013  . Liver hemangioma 08/14/2016  . Neuromuscular disorder ([place])    chronic inflammatory  demyelinating polyneuropathy   . New onset seizure ([place]) 07/08/2017  . Nuclear sclerosis of both eyes 10/25/2016  . Primary open angle glaucoma of both eyes, indeterminate stage 10/25/2016  . Renal mass, right 08/14/2016  . Status cardiac pacemaker 01/29/2017   Placed for second degree heart block on 01/16/17 Medtronic Azure XT DR MRI SureScan dual-chamber pacemaker  . Stroke [facility])    2011 with residual deficit left sided weakness  . Tobacco dependence     Past Surgical History:  Procedure Laterality Date  . EYE SURGERY    . INGUINAL HERNIA REPAIR Right 11/23/2014   Procedure: right inguinal hernia repair with mesh;  Surgeon: Armandina Gemma, MD;  Location: [facility];  Service: General;  Laterality: Right;  . INSERTION OF MESH N/A 11/23/2014   Procedure: INSERTION OF MESH;  Surgeon: Armandina Gemma, MD;  Location: [facility];  Service: General;  Laterality: N/A;  . PACEMAKER IMPLANT N/A 01/16/2017   Procedure: Pacemaker Implant;  Surgeon: Will Meredith Leeds, MD;  Location: [facility] CV LAB;  Service: Cardiovascular;  Laterality: N/A;  . SHOULDER SURGERY Bilateral 1988, 1998    Allergies as of 07/19/2017      Reactions   Doxycycline Other (See Comments)   Hiccups, cough, nausea and emesis, elevated liver enzymes, elevated eosinophils, SOB concerning for early DRESS syndrome    Atacand Hct [candesartan Cilexetil-hctz] Hives   Shellfish Allergy Hives      Medication List       Accurate as of 07/19/17  11:59 PM. Always use your most recent med list.          amLODipine 10 MG tablet Commonly known as:  NORVASC Take 1 tablet (10 mg total) by mouth every morning.   aspirin EC 81 MG tablet Take 1 tablet (81 mg total) by mouth daily.   atorvastatin 40 MG tablet Commonly known as:  LIPITOR Take 1 tablet (40 mg total) by mouth daily.   brimonidine 0.1 % Soln Commonly known as:  ALPHAGAN P Place 1 drop into both eyes 2 (two) times daily.   DAILY VITAMIN FORMULA+IRON Tabs Take 1 tablet by mouth  daily.   divalproex 500 MG DR tablet Commonly known as:  DEPAKOTE Take 1 tablet (500 mg total) by mouth 2 (two) times daily.   dorzolamide-timolol 22.3-6.8 MG/ML ophthalmic solution Commonly known as:  COSOPT Place 1 drop into both eyes 2 (two) times daily.   EQ DAILY FIBER PO Take 1 tablet by mouth daily.   esomeprazole 20 MG capsule Commonly known as:  NEXIUM Take 20 mg by mouth daily at 12 noon.   fluticasone 50 MCG/ACT nasal spray Commonly known as:  FLONASE Place 2 sprays into both nostrils daily.   folic acid 1 MG tablet Commonly known as:  FOLVITE Take 1 tablet (1 mg total) by mouth daily.   hydrALAZINE 50 MG tablet Commonly known as:  APRESOLINE Take 1 tablet (50 mg total) by mouth 3 (three) times daily.   ketoconazole 2 % cream Commonly known as:  NIZORAL Apply 1 application topically daily as needed for irritation.   latanoprost 0.005 % ophthalmic solution Commonly known as:  XALATAN Place 1 drop into both eyes at bedtime.   lisinopril 20 MG tablet Commonly known as:  PRINIVIL,ZESTRIL Take 1 tablet (20 mg total) by mouth daily.   metoprolol tartrate 25 MG tablet Commonly known as:  LOPRESSOR Take 1 tablet (25 mg total) by mouth 2 (two) times daily.   nicotine 21 mg/24hr patch Commonly known as:  NICODERM CQ - dosed in mg/24 hours Place 21 mg onto the skin. Apply patch topically onto skin daily for 4 weeks. Stop 08/14/17   terbinafine 1 % cream Commonly known as:  LAMISIL AT Apply 1 application topically 2 (two) times daily. To R foot   thiamine 100 MG tablet Take 1 tablet (100 mg total) by mouth daily.   torsemide 20 MG tablet Commonly known as:  DEMADEX Take 20 mg by mouth daily.       No orders of the defined types were placed in this encounter.   Immunization History  Administered Date(s) Administered  . Influenza,inj,Quad PF,6+ Mos 09/21/2014, 08/16/2015, 05/25/2016  . Pneumococcal Polysaccharide-23 09/21/2014  . Tdap 11/25/2015     Social History  Substance Use Topics  . Smoking status: Current Every Day Smoker    Packs/day: 1.00    Years: 40.00    Types: Cigarettes  . Smokeless tobacco: Never Used  . Alcohol use No     Comment: alcohol free for 1 year was drinking 1/2 pint per day     Review of Systems  DATA OBTAINED: from wife and nurse GENERAL:  no fevers,+ fatigue, appetite changes SKIN: No itching, rash HEENT: No complaint RESPIRATORY: occ cough; No  wheezing, SOB CARDIAC: No chest pain, palpitations, lower extremity edema  GI: No abdominal pain, No N/V/D or constipation, No heartburn or reflux  GU: + dysuria, frequency ,urgency, MUSCULOSKELETAL: No unrelieved bone/joint pain NEUROLOGIC: No headache, dizziness  PSYCHIATRIC: No overt anxiety  or sadness  Vitals:   07/19/17 1530  BP: (!) 84/47  Pulse: 77  Resp: 18  Temp: 99.1 F (37.3 C)   Body mass index is 20.69 kg/m. Physical Exam  GENERAL APPEARANCE: Alert, answers questions yes or no, otherwise quiet; looks ill but not septic SKIN: No diaphoresis rash HEENT: Unremarkable RESPIRATORY: Breathing is even, unlabored. Lung sounds are clear   CARDIOVASCULAR: Heart RRR no murmurs, rubs or gallops. No peripheral edema  GASTROINTESTINAL: Abdomen is soft, non-tender, not distended w/ normal bowel sounds.  GENITOURINARY: Bladder mild  tender, not distended  MUSCULOSKELETAL: No abnormal joints or musculature NEUROLOGIC: Cranial nerves 2-12 grossly intact left side weakness PSYCHIATRIC: Mood and affect appropriate to situation, no behavioral issues  Patient Active Problem List   Diagnosis Date Noted  . CIDP (chronic inflammatory demyelinating polyneuropathy) ([place])   . Bradycardia   . Acute blood loss anemia   . New onset seizure ([place]) 07/08/2017  . Status cardiac pacemaker 01/29/2017  . Unintended weight loss 01/29/2017  . Tinea pedis of right foot 01/29/2017  . Hypertensive retinopathy of both eyes 01/16/2017  . Hypercalcemia  01/15/2017  . Heart block 01/15/2017  . Nuclear sclerosis of both eyes 10/25/2016  . Primary open angle glaucoma of both eyes, indeterminate stage 10/25/2016  . Constipation 09/12/2016  . Glaucoma 09/12/2016  . Liver hemangioma 08/14/2016  . Renal cyst 08/14/2016  . Renal mass, right 08/14/2016  . Elevated liver enzymes 08/10/2016  . CKD (chronic kidney disease) stage 3, GFR 30-59 ml/min ([name]) 08/16/2015  . At high risk for falls 08/16/2015  . Subcutaneous nodules 08/16/2015  . Prediabetes 10/07/2013  . Inguinal hernia 03/25/2013  . History of CVA with residual deficit 03/25/2013  . HTN (hypertension) 10/09/2012  . Tobacco abuse 10/09/2012    CMP     Component Value Date/Time   NA 139 07/12/2017 0521   NA 138 02/09/2017 1117   K 4.5 07/12/2017 0521   CL 111 07/12/2017 0521   CO2 20 (L) 07/12/2017 0521   GLUCOSE 78 07/12/2017 0521   BUN 13 07/12/2017 0521   BUN 28 (H) 02/09/2017 1117   CREATININE 1.84 (H) 07/12/2017 0521   CREATININE 1.45 (H) 08/10/2016 1647   CALCIUM 9.3 07/12/2017 0521   PROT 5.5 (L) 07/09/2017 0529   PROT 7.1 10/20/2013 1443   ALBUMIN 2.9 (L) 07/09/2017 0529   AST 13 (L) 07/09/2017 0529   ALT 8 (L) 07/09/2017 0529   ALKPHOS 81 07/09/2017 0529   BILITOT 0.7 07/09/2017 0529   GFRNONAA 37 (L) 07/12/2017 0521   GFRNONAA 51 (L) 08/10/2016 1647   GFRAA 43 (L) 07/12/2017 0521   GFRAA 58 (L) 08/10/2016 1647    Recent Labs  07/07/17 2100 07/08/17 1328 07/09/17 0529 07/10/17 0603 07/11/17 0504 07/12/17 0521  NA 136 136 138 140 140 139  K 4.4 4.3 4.1 4.3 4.5 4.5  CL 104 108 108 112* 113* 111  CO2 20* 22 24 22 22  20*  GLUCOSE 101* 162* 84 72 81 78  BUN 17 18 17 14 12 13   CREATININE 1.85* 2.07* 1.92* 1.77* 1.83* 1.84*  CALCIUM 9.7 9.2 9.2 9.5 9.1 9.3  MG 2.0 1.8 1.6*  --   --   --   PHOS  --  2.6  --   --   --   --     Recent Labs  01/15/17 2348 07/07/17 2100 07/09/17 0529  AST 12* 18 13*  ALT 9* 6* 8*  ALKPHOS 80 95 81  BILITOT 0.7 0.4  0.7  PROT 6.5 6.5 5.5*  ALBUMIN 3.5 3.4* 2.9*    Recent Labs  08/04/16 1558  02/09/17 1117 07/07/17 2100  07/10/17 0603 07/11/17 0504 07/12/17 0521  WBC 15.8*  < > 7.6 9.8  < > 7.5 7.3 7.4  NEUTROABS 11.8*  --  4.6 5.1  --   --   --   --   HGB 11.7*  < > 13.3 12.2*  < > 11.1* 11.1* 11.3*  HCT 34.7*  < > 39.7 38.5*  < > 34.9* 34.1* 34.9*  MCV 87.8  < > 85 90.2  < > 89.9 89.7 90.2  PLT 348  < > 309 275  < > 240 236 242  < > = values in this interval not displayed. No results for input(s): CHOL, LDLCALC, TRIG in the last 8760 hours.  Invalid input(s): HCL No results found for: [facility] Lab Results  Component Value Date   TSH 0.476 07/08/2017   Lab Results  Component Value Date   HGBA1C 5.3 10/07/2013   Lab Results  Component Value Date   CHOL 141 08/16/2015   HDL 34 (L) 08/16/2015   LDLCALC 84 08/16/2015   TRIG 116 08/16/2015   CHOLHDL 4.1 08/16/2015    Significant Diagnostic Results in last 30 days:  Ct Head Wo Contrast  Result Date: 07/07/2017 CLINICAL DATA:  Acute onset of seizure.  Initial encounter. EXAM: CT HEAD WITHOUT CONTRAST TECHNIQUE: Contiguous axial images were obtained from the base of the skull through the vertex without intravenous contrast. COMPARISON:  CT of the head performed 12/06/2016 FINDINGS: Brain: No evidence of acute infarction, hemorrhage, hydrocephalus, extra-axial collection or mass lesion/mass effect. Prominence of the ventricles and sulci reflects mild cortical volume loss. A chronic infarct noted at the right occipital lobe, with associated encephalomalacia. Diffuse periventricular and subcortical white matter likely reflects small vessel ischemic microangiopathy. Chronic lacunar infarcts are noted at the right basal ganglia and bilateral thalami. Cerebellar atrophy is noted. The brainstem and fourth ventricle are within normal limits. No mass effect or midline shift is seen. Vascular: No hyperdense vessel or unexpected calcification. Skull:  There is no evidence of fracture; visualized osseous structures are unremarkable in appearance. Sinuses/Orbits: The orbits are within normal limits. A mucus retention cyst or polyp is noted at the left maxillary sinus. The remaining paranasal sinuses and mastoid air cells are well-aerated. Other: No significant soft tissue abnormalities are seen. IMPRESSION: 1. No acute intracranial pathology seen on CT. 2. Mild cortical volume loss and diffuse small vessel ischemic microangiopathy. 3. Chronic infarct at the right occipital lobe, with associated encephalomalacia. Chronic lacunar infarcts at the right basal ganglia and bilateral thalami. 4. Mucus retention cyst or polyp at the left maxillary sinus. Electronically Signed   By: Garald Balding M.D.   On: 07/07/2017 21:49   Mr Brain Wo Contrast (neuro Protocol)  Result Date: 07/09/2017 CLINICAL DATA:  Seizure and altered mental status EXAM: MRI HEAD WITHOUT CONTRAST TECHNIQUE: Multiplanar, multiecho pulse sequences of the brain and surrounding structures were obtained without intravenous contrast. COMPARISON:  Head CT 07/07/2017 FINDINGS: The examination had to be discontinued prior to completion due to patient altered mental status and inability to cooperate with the technologist's instructions. Diffusion-weighted imaging shows no evidence of acute ischemia. There is advanced bilateral white matter hyperintensity consistent with chronic ischemic microangiopathy. Old right basal ganglia lacunar infarct. No midline shift or other mass effect. IMPRESSION: Truncated and motion degraded examination. Within that limitation, no acute ischemia  or significant mass effect. Advanced chronic microvascular disease. Electronically Signed   By: Ulyses Jarred M.D.   On: 07/09/2017 16:41   Dg Chest Port 1 View  Result Date: 07/07/2017 CLINICAL DATA:  Initial evaluation for acute seizure. EXAM: PORTABLE CHEST 1 VIEW COMPARISON:  Prior radiograph from 01/17/2017. FINDINGS:  Patient is rotated to the left. Cardiomegaly is unchanged. Mediastinal silhouette unchanged allowing for patient rotation. Aortic atherosclerosis. Left-sided [place] Free transvenous pacemaker/ AICD noted. Defibrillator pad overlies left chest. Lungs hypoinflated. Mild perihilar vascular congestion without overt pulmonary edema. No focal infiltrates. No significant pleural effusion. No pneumothorax. No acute osseus abnormality.  Osteopenia noted. IMPRESSION: 1. Stable cardiomegaly with mild perihilar vascular congestion without pulmonary edema. 2. No other active cardiopulmonary disease. Electronically Signed   By: Jeannine Boga M.D.   On: 07/07/2017 21:15   Dg Swallowing Func-speech Pathology  Result Date: 07/10/2017 Objective Swallowing Evaluation: Type of Study: MBS-Modified Barium Swallow Study Patient Details Name: [name] MRN: [id number] Date of Birth: 1951/04/02 Today's Date: 07/10/2017 Time: SLP Start Time (ACUTE ONLY): 1041-SLP Stop Time (ACUTE ONLY): 1107 SLP Time Calculation (min) (ACUTE ONLY): 26 min Past Medical History: Past Medical History: Diagnosis Date . Chronic kidney disease   stage 3 GFR 30-59 ml/min  . Dysphagia as late effect of cerebrovascular disease   pts wife states pt has to eat soft foods  . GERD (gastroesophageal reflux disease)  . Glaucoma  . High cholesterol  . Hypertension  . Neuromuscular disorder ([place])   chronic inflammatory demyelinating polyneuropathy  . Stroke [facility])   2011 with residual deficit left sided weakness . Tobacco dependence  Past Surgical History: Past Surgical History: Procedure Laterality Date . EYE SURGERY   . INGUINAL HERNIA REPAIR Right 11/23/2014  Procedure: right inguinal hernia repair with mesh;  Surgeon: Armandina Gemma, MD;  Location: [facility];  Service: General;  Laterality: Right; . INSERTION OF MESH N/A 11/23/2014  Procedure: INSERTION OF MESH;  Surgeon: Armandina Gemma, MD;  Location: [facility];  Service: General;  Laterality: N/A; . PACEMAKER IMPLANT N/A  01/16/2017  Procedure: Pacemaker Implant;  Surgeon: Will Meredith Leeds, MD;  Location: [place] [facility] LAB;  Service: Cardiovascular;  Laterality: N/A; . SHOULDER SURGERY Bilateral 1988, 2073 HPI: 66 year old history of prior CVA, CK D, dysphagia, GERD, HLD, HTN presents with new onset seizures. Treated with Versed and then loaded with Depakote in ER. ER M.D. discuss case with neurology who recommends admission to medicine for EEG in a.m. recommended initially MRI unable to obtain as patient does have history of pacemaker.  CT head unremarkable.   Subjective: pt drowsy this morning Assessment / Plan / Recommendation CHL IP CLINICAL IMPRESSIONS 07/10/2017 Clinical Impression Pt has a mild-moderate oropharyngeal dysphagia that seems relatively similar to previous MBS in 2015, although it is also impacted by pt's lethargy this morning (may be medication related per MD note). Pt has decreased oral cohesion with some lingual rocking, slow transit, and lingual residue mostly with solids. He has somewhat passive spillage into his pharynx, with solids and liquids cascading from his base of tongue down into the pyriform sinuses before his swallow is triggered. Even with large, consecutive straw sips of thin liquids in isolation, he has only occasional, trace penetration that he clears with a cued throat clear. However, once pt has oral residue from solids in his mouth and along his base of tongue, it interferes with his ability to contain thin liquids, allowing them to spill silently into his trachea before the swallow. Recommend  to continue current orders for Dsy 2 diet and thin liquids, but would avoid any mixed consistencies and would NOT use any liquid washes for clearing solids from his oral cavity. Purees and lingual sweeps could be used instead. His wife was present for study and education and verbalized her understanding as well. SLP also reinforced the importance of oral care to help reduce the risk of  aspiration-related infections. SLP Visit Diagnosis Dysphagia, oropharyngeal phase (R13.12) Attention and concentration deficit following -- Frontal lobe and executive function deficit following -- Impact on safety and function Mild aspiration risk   CHL IP TREATMENT RECOMMENDATION 07/10/2017 Treatment Recommendations Therapy as outlined in treatment plan below   Prognosis 07/10/2017 Prognosis for Safe Diet Advancement Fair Barriers to Reach Goals Cognitive deficits;Time post onset Barriers/Prognosis Comment -- CHL IP DIET RECOMMENDATION 07/10/2017 SLP Diet Recommendations Dysphagia 2 (Fine chop) solids;Thin liquid Liquid Administration via Straw Medication Administration Whole meds with puree Compensations Slow rate;Small sips/bites;Minimize environmental distractions;Lingual sweep for clearance of pocketing;Clear throat intermittently Postural Changes Remain semi-upright after after feeds/meals (Comment);Seated upright at 90 degrees   CHL IP OTHER RECOMMENDATIONS 07/10/2017 Recommended Consults -- Oral Care Recommendations Oral care BID Other Recommendations Have oral suction available   CHL IP FOLLOW UP RECOMMENDATIONS 07/10/2017 Follow up Recommendations Inpatient Rehab   CHL IP FREQUENCY AND DURATION 07/10/2017 Speech Therapy Frequency (ACUTE ONLY) min 2x/week Treatment Duration 2 weeks      CHL IP ORAL PHASE 07/10/2017 Oral Phase Impaired Oral - Pudding Teaspoon -- Oral - Pudding Cup -- Oral - Honey Teaspoon -- Oral - Honey Cup -- Oral - Nectar Teaspoon -- Oral - Nectar Cup -- Oral - Nectar Straw Weak lingual manipulation;Lingual pumping;Reduced posterior propulsion;Delayed oral transit;Decreased bolus cohesion Oral - Thin Teaspoon -- Oral - Thin Cup -- Oral - Thin Straw Weak lingual manipulation;Lingual pumping;Reduced posterior propulsion;Delayed oral transit;Decreased bolus cohesion Oral - Puree Weak lingual manipulation;Lingual pumping;Reduced posterior propulsion;Delayed oral transit;Decreased bolus  cohesion Oral - Mech Soft Weak lingual manipulation;Lingual pumping;Reduced posterior propulsion;Delayed oral transit;Decreased bolus cohesion;Impaired mastication;Lingual/palatal residue Oral - Regular -- Oral - Multi-Consistency -- Oral - Pill -- Oral Phase - Comment --  CHL IP PHARYNGEAL PHASE 07/10/2017 Pharyngeal Phase Impaired Pharyngeal- Pudding Teaspoon -- Pharyngeal -- Pharyngeal- Pudding Cup -- Pharyngeal -- Pharyngeal- Honey Teaspoon -- Pharyngeal -- Pharyngeal- Honey Cup -- Pharyngeal -- Pharyngeal- Nectar Teaspoon -- Pharyngeal -- Pharyngeal- Nectar Cup -- Pharyngeal -- Pharyngeal- Nectar Straw Delayed swallow initiation-pyriform sinuses Pharyngeal -- Pharyngeal- Thin Teaspoon -- Pharyngeal -- Pharyngeal- Thin Cup -- Pharyngeal -- Pharyngeal- Thin Straw Delayed swallow initiation-pyriform sinuses;Penetration/Aspiration before swallow Pharyngeal Material enters airway, passes BELOW cords without attempt by patient to eject out (silent aspiration);Material enters airway, remains ABOVE vocal cords and not ejected out Pharyngeal- Puree Delayed swallow initiation-pyriform sinuses Pharyngeal -- Pharyngeal- Mechanical Soft Delayed swallow initiation-pyriform sinuses Pharyngeal -- Pharyngeal- Regular -- Pharyngeal -- Pharyngeal- Multi-consistency -- Pharyngeal -- Pharyngeal- Pill -- Pharyngeal -- Pharyngeal Comment --  CHL IP CERVICAL ESOPHAGEAL PHASE 07/10/2017 Cervical Esophageal Phase WFL Pudding Teaspoon -- Pudding Cup -- Honey Teaspoon -- Honey Cup -- Nectar Teaspoon -- Nectar Cup -- Nectar Straw -- Thin Teaspoon -- Thin Cup -- Thin Straw -- Puree -- Mechanical Soft -- Regular -- Multi-consistency -- Pill -- Cervical Esophageal Comment -- CHL IP GO 11/05/2013 Functional Assessment Tool Used mbs, clinical judgement Functional Limitations Swallowing Swallow Current Status (K9574) CJ Swallow Goal Status (B3403) CJ Swallow Discharge Status (J0964) CJ Motor Speech Current Status (R8381) (None) Motor Speech Goal  Status (M4037) (  None) Motor Speech Goal Status [phone]) (None) Spoken Language Comprehension Current Status [phone]) (None) Spoken Language Comprehension Goal Status [phone]) (None) Spoken Language Comprehension Discharge Status [phone]) (None) Spoken Language Expression Current Status [phone]) (None) Spoken Language Expression Goal Status [phone]) (None) Spoken Language Expression Discharge Status [phone]) (None) Attention Current Status (I6803) (None) Attention Goal Status (O1224) (None) Attention Discharge Status [phone]) (None) Memory Current Status (B7048) (None) Memory Goal Status (G8916) (None) Memory Discharge Status (X4503) (None) Voice Current Status (U8828) (None) Voice Goal Status (M0349) (None) Voice Discharge Status (Z7915) (None) Other Speech-Language Pathology Functional Limitation Current Status (A5697) (None) Other Speech-Language Pathology Functional Limitation Goal Status (X4801) (None) Other Speech-Language Pathology Functional Limitation Discharge Status [phone]) (None) [name] 07/10/2017, 12:12 PM  [name], M.A. CCC-SLP [phone]              Assessment and Plan  LOOKS SICK/URINARY SYMPTOMS-nursing facility with focus when he gets a UTI; have ordered a CBC with differential, CMP, UA with C&S and chest x-ray PA and lateral; start patient on Rocephin 1 g IM 7 days; we'll monitor his labs and urine results return     Webb Silversmith D. Sheppard Coil, MD

## 2017-07-20 NOTE — Progress Notes (Signed)
Location:  [facility] Room Number: [place]:  SNF [phone])  Provider: Noah Delaine. Sheppard Coil, MD  Boykin Nearing, MD  Patient Care Team: Boykin Nearing, MD as PCP - General (Family Medicine) Penni Bombard, MD as Consulting Physician (Neurology)  Extended Emergency Contact Information Primary Emergency Contact: [facility] Address: [address]          [place], [place] [address] Johnnette Litter of [place] Phone: [phone] Mobile Phone: [phone] Relation: Spouse    Allergies: Doxycycline; Atacand hct [candesartan cilexetil-hctz]; and Shellfish allergy  Chief Complaint  Patient presents with  . Acute Visit    renal failure    HPI: Patient is 66 y.o. male who is being seen in follow-up from yesterday. Wife admits he is still drinking, she is giving him 120 mL every 2 hours for 8 cups as I told her to do yesterday but he is eating less today. In addition his labs have returned and his WBC count is 28.6 with 81 segs and his BUN is 42.8 with a crea of 3.14 which is elevated from prior. No reported fever vomiting nausea diarrhea.  Past Medical History:  Diagnosis Date  . At high risk for falls 08/16/2015  . Bradycardia   . Chronic kidney disease    stage 3 GFR 30-59 ml/min   . CIDP (chronic inflammatory demyelinating polyneuropathy) ([place])   . CKD (chronic kidney disease) stage 3, GFR 30-59 ml/min ([name]) 08/16/2015  . Dysphagia as late effect of cerebrovascular disease    pts wife states pt has to eat soft foods   . Elevated liver enzymes 08/10/2016  . GERD (gastroesophageal reflux disease)   . Glaucoma   . High cholesterol   . History of CVA with residual deficit 03/25/2013  . Hypertension   . Hypertensive retinopathy of both eyes 01/16/2017  . Inguinal hernia 03/25/2013  . Liver hemangioma 08/14/2016  . Neuromuscular disorder ([place])    chronic inflammatory demyelinating polyneuropathy   . New onset seizure ([place]) 07/08/2017   . Nuclear sclerosis of both eyes 10/25/2016  . Primary open angle glaucoma of both eyes, indeterminate stage 10/25/2016  . Renal mass, right 08/14/2016  . Status cardiac pacemaker 01/29/2017   Placed for second degree heart block on 01/16/17 Medtronic Azure XT DR MRI SureScan dual-chamber pacemaker  . Stroke [facility])    2011 with residual deficit left sided weakness  . Tobacco dependence     Past Surgical History:  Procedure Laterality Date  . EYE SURGERY    . INGUINAL HERNIA REPAIR Right 11/23/2014   Procedure: right inguinal hernia repair with mesh;  Surgeon: Armandina Gemma, MD;  Location: [facility];  Service: General;  Laterality: Right;  . INSERTION OF MESH N/A 11/23/2014   Procedure: INSERTION OF MESH;  Surgeon: Armandina Gemma, MD;  Location: [facility];  Service: General;  Laterality: N/A;  . PACEMAKER IMPLANT N/A 01/16/2017   Procedure: Pacemaker Implant;  Surgeon: Will Meredith Leeds, MD;  Location: [facility];  Service: Cardiovascular;  Laterality: N/A;  . SHOULDER SURGERY Bilateral 1988, 1998    Allergies as of 07/20/2017      Reactions   Doxycycline Other (See Comments)   Hiccups, cough, nausea and emesis, elevated liver enzymes, elevated eosinophils, SOB concerning for early DRESS syndrome    Atacand Hct [candesartan Cilexetil-hctz] Hives   Shellfish Allergy Hives      Medication List       Accurate as of 07/20/17  3:27 PM. Always  use your most recent med list.          amLODipine 10 MG tablet Commonly known as:  NORVASC Take 1 tablet (10 mg total) by mouth every morning.   aspirin EC 81 MG tablet Take 1 tablet (81 mg total) by mouth daily.   atorvastatin 40 MG tablet Commonly known as:  LIPITOR Take 1 tablet (40 mg total) by mouth daily.   brimonidine 0.1 % Soln Commonly known as:  ALPHAGAN P Place 1 drop into both eyes 2 (two) times daily.   DAILY VITAMIN FORMULA+IRON Tabs Take 1 tablet by mouth daily.   divalproex 500 MG DR tablet Commonly known as:   DEPAKOTE Take 1 tablet (500 mg total) by mouth 2 (two) times daily.   dorzolamide-timolol 22.3-6.8 MG/ML ophthalmic solution Commonly known as:  COSOPT Place 1 drop into both eyes 2 (two) times daily.   EQ DAILY FIBER PO Take 1 tablet by mouth daily.   esomeprazole 20 MG capsule Commonly known as:  NEXIUM Take 20 mg by mouth daily at 12 noon.   fluticasone 50 MCG/ACT nasal spray Commonly known as:  FLONASE Place 2 sprays into both nostrils daily.   folic acid 1 MG tablet Commonly known as:  FOLVITE Take 1 tablet (1 mg total) by mouth daily.   hydrALAZINE 50 MG tablet Commonly known as:  APRESOLINE Take 1 tablet (50 mg total) by mouth 3 (three) times daily.   ketoconazole 2 % cream Commonly known as:  NIZORAL Apply 1 application topically daily as needed for irritation.   latanoprost 0.005 % ophthalmic solution Commonly known as:  XALATAN Place 1 drop into both eyes at bedtime.   lisinopril 20 MG tablet Commonly known as:  PRINIVIL,ZESTRIL Take 1 tablet (20 mg total) by mouth daily.   metoprolol tartrate 25 MG tablet Commonly known as:  LOPRESSOR Take 1 tablet (25 mg total) by mouth 2 (two) times daily.   nicotine 21 mg/24hr patch Commonly known as:  NICODERM CQ - dosed in mg/24 hours Place 21 mg onto the skin. Apply patch topically onto skin daily for 4 weeks. Stop 08/14/17   terbinafine 1 % cream Commonly known as:  LAMISIL AT Apply 1 application topically 2 (two) times daily. To R foot   thiamine 100 MG tablet Take 1 tablet (100 mg total) by mouth daily.   torsemide 20 MG tablet Commonly known as:  DEMADEX Take 20 mg by mouth daily.       Meds ordered this encounter  Medications  . nicotine (NICODERM CQ - DOSED IN MG/24 HOURS) 21 mg/24hr patch    Sig: Place 21 mg onto the skin. Apply patch topically onto skin daily for 4 weeks. Stop 08/14/17    Immunization History  Administered Date(s) Administered  . Influenza,inj,Quad PF,6+ Mos 09/21/2014,  08/16/2015, 05/25/2016  . Pneumococcal Polysaccharide-23 09/21/2014  . Tdap 11/25/2015    Social History  Substance Use Topics  . Smoking status: Current Every Day Smoker    Packs/day: 1.00    Years: 40.00    Types: Cigarettes  . Smokeless tobacco: Never Used  . Alcohol use No     Comment: alcohol free for 1 year was drinking 1/2 pint per day     Review of Systems  DATA OBTAINED: from wife-she has been with him all day and all night, she is a good historian GENERAL:  no fevers, +fatigue, +appetite changes SKIN: No itching, rash HEENT: No complaint RESPIRATORY: No cough, wheezing, SOB CARDIAC: No chest pain,  palpitations, lower extremity edema  GI: No abdominal pain, No N/V/D or constipation, No heartburn or reflux  GU: + dysuria, frequency or urgency MUSCULOSKELETAL: No unrelieved bone/joint pain NEUROLOGIC: No headache, dizziness  PSYCHIATRIC: No overt anxiety or sadness  Vitals:   07/20/17 0926  BP: 93/63  Pulse: 81  Resp: 18  Temp: 98.8 F (37.1 C)   Body mass index is 20.69 kg/m. Physical Exam  GENERAL APPEARANCE: Alert but sleepy, appears sick but not worse than yesterday, although he appears drier  SKIN: No diaphoresis rash HEENT: Unremarkable RESPIRATORY: Breathing is even, unlabored. Lung sounds are clear   CARDIOVASCULAR: Heart RRR no murmurs, rubs or gallops. No peripheral edema  GASTROINTESTINAL: Abdomen is soft, non-tender, not distended w/ normal bowel sounds.  GENITOURINARY: Bladder mild tender, not distended  MUSCULOSKELETAL: No abnormal joints or musculature NEUROLOGIC: Cranial nerves 2-12 grossly intact;left side weakness PSYCHIATRIC: Mood and affect appropriate to situation, no behavioral issues  Patient Active Problem List   Diagnosis Date Noted  . CIDP (chronic inflammatory demyelinating polyneuropathy) ([place])   . Bradycardia   . Acute blood loss anemia   . New onset seizure ([employer]) 07/08/2017  . Status cardiac pacemaker 01/29/2017  .  Unintended weight loss 01/29/2017  . Tinea pedis of right foot 01/29/2017  . Hypertensive retinopathy of both eyes 01/16/2017  . Hypercalcemia 01/15/2017  . Heart block 01/15/2017  . Nuclear sclerosis of both eyes 10/25/2016  . Primary open angle glaucoma of both eyes, indeterminate stage 10/25/2016  . Constipation 09/12/2016  . Glaucoma 09/12/2016  . Liver hemangioma 08/14/2016  . Renal cyst 08/14/2016  . Renal mass, right 08/14/2016  . Elevated liver enzymes 08/10/2016  . CKD (chronic kidney disease) stage 3, GFR 30-59 ml/min ([name]) 08/16/2015  . At high risk for falls 08/16/2015  . Subcutaneous nodules 08/16/2015  . Prediabetes 10/07/2013  . Inguinal hernia 03/25/2013  . History of CVA with residual deficit 03/25/2013  . HTN (hypertension) 10/09/2012  . Tobacco abuse 10/09/2012    CMP     Component Value Date/Time   NA 139 07/12/2017 0521   NA 138 02/09/2017 1117   K 4.5 07/12/2017 0521   CL 111 07/12/2017 0521   CO2 20 (L) 07/12/2017 0521   GLUCOSE 78 07/12/2017 0521   BUN 13 07/12/2017 0521   BUN 28 (H) 02/09/2017 1117   CREATININE 1.84 (H) 07/12/2017 0521   CREATININE 1.45 (H) 08/10/2016 1647   CALCIUM 9.3 07/12/2017 0521   PROT 5.5 (L) 07/09/2017 0529   PROT 7.1 10/20/2013 1443   ALBUMIN 2.9 (L) 07/09/2017 0529   AST 13 (L) 07/09/2017 0529   ALT 8 (L) 07/09/2017 0529   ALKPHOS 81 07/09/2017 0529   BILITOT 0.7 07/09/2017 0529   GFRNONAA 37 (L) 07/12/2017 0521   GFRNONAA 51 (L) 08/10/2016 1647   GFRAA 43 (L) 07/12/2017 0521   GFRAA 58 (L) 08/10/2016 1647    Recent Labs  07/07/17 2100 07/08/17 1328 07/09/17 0529 07/10/17 0603 07/11/17 0504 07/12/17 0521  NA 136 136 138 140 140 139  K 4.4 4.3 4.1 4.3 4.5 4.5  CL 104 108 108 112* 113* 111  CO2 20* 22 24 22 22  20*  GLUCOSE 101* 162* 84 72 81 78  BUN 17 18 17 14 12 13   CREATININE 1.85* 2.07* 1.92* 1.77* 1.83* 1.84*  CALCIUM 9.7 9.2 9.2 9.5 9.1 9.3  MG 2.0 1.8 1.6*  --   --   --   PHOS  --  2.6  --    --   --   --  Recent Labs  01/15/17 2348 07/07/17 2100 07/09/17 0529  AST 12* 18 13*  ALT 9* 6* 8*  ALKPHOS 80 95 81  BILITOT 0.7 0.4 0.7  PROT 6.5 6.5 5.5*  ALBUMIN 3.5 3.4* 2.9*    Recent Labs  08/04/16 1558  02/09/17 1117 07/07/17 2100  07/10/17 0603 07/11/17 0504 07/12/17 0521  WBC 15.8*  < > 7.6 9.8  < > 7.5 7.3 7.4  NEUTROABS 11.8*  --  4.6 5.1  --   --   --   --   HGB 11.7*  < > 13.3 12.2*  < > 11.1* 11.1* 11.3*  HCT 34.7*  < > 39.7 38.5*  < > 34.9* 34.1* 34.9*  MCV 87.8  < > 85 90.2  < > 89.9 89.7 90.2  PLT 348  < > 309 275  < > 240 236 242  < > = values in this interval not displayed. No results for input(s): CHOL, LDLCALC, TRIG in the last 8760 hours.  Invalid input(s): HCL No results found for: [facility] Lab Results  Component Value Date   TSH 0.476 07/08/2017   Lab Results  Component Value Date   HGBA1C 5.3 10/07/2013   Lab Results  Component Value Date   CHOL 141 08/16/2015   HDL 34 (L) 08/16/2015   LDLCALC 84 08/16/2015   TRIG 116 08/16/2015   CHOLHDL 4.1 08/16/2015    Significant Diagnostic Results in last 30 days:  Ct Head Wo Contrast  Result Date: 07/07/2017 CLINICAL DATA:  Acute onset of seizure.  Initial encounter. EXAM: CT HEAD WITHOUT CONTRAST TECHNIQUE: Contiguous axial images were obtained from the base of the skull through the vertex without intravenous contrast. COMPARISON:  CT of the head performed 12/06/2016 FINDINGS: Brain: No evidence of acute infarction, hemorrhage, hydrocephalus, extra-axial collection or mass lesion/mass effect. Prominence of the ventricles and sulci reflects mild cortical volume loss. A chronic infarct noted at the right occipital lobe, with associated encephalomalacia. Diffuse periventricular and subcortical white matter likely reflects small vessel ischemic microangiopathy. Chronic lacunar infarcts are noted at the right basal ganglia and bilateral thalami. Cerebellar atrophy is noted. The brainstem and  fourth ventricle are within normal limits. No mass effect or midline shift is seen. Vascular: No hyperdense vessel or unexpected calcification. Skull: There is no evidence of fracture; visualized osseous structures are unremarkable in appearance. Sinuses/Orbits: The orbits are within normal limits. A mucus retention cyst or polyp is noted at the left maxillary sinus. The remaining paranasal sinuses and mastoid air cells are well-aerated. Other: No significant soft tissue abnormalities are seen. IMPRESSION: 1. No acute intracranial pathology seen on CT. 2. Mild cortical volume loss and diffuse small vessel ischemic microangiopathy. 3. Chronic infarct at the right occipital lobe, with associated encephalomalacia. Chronic lacunar infarcts at the right basal ganglia and bilateral thalami. 4. Mucus retention cyst or polyp at the left maxillary sinus. Electronically Signed   By: Garald Balding M.D.   On: 07/07/2017 21:49   Mr Brain Wo Contrast (neuro Protocol)  Result Date: 07/09/2017 CLINICAL DATA:  Seizure and altered mental status EXAM: MRI HEAD WITHOUT CONTRAST TECHNIQUE: Multiplanar, multiecho pulse sequences of the brain and surrounding structures were obtained without intravenous contrast. COMPARISON:  Head CT 07/07/2017 FINDINGS: The examination had to be discontinued prior to completion due to patient altered mental status and inability to cooperate with the technologist's instructions. Diffusion-weighted imaging shows no evidence of acute ischemia. There is advanced bilateral white matter hyperintensity consistent with chronic ischemic microangiopathy. Old  right basal ganglia lacunar infarct. No midline shift or other mass effect. IMPRESSION: Truncated and motion degraded examination. Within that limitation, no acute ischemia or significant mass effect. Advanced chronic microvascular disease. Electronically Signed   By: Ulyses Jarred M.D.   On: 07/09/2017 16:41   Dg Chest Port 1 View  Result Date:  07/07/2017 CLINICAL DATA:  Initial evaluation for acute seizure. EXAM: PORTABLE CHEST 1 VIEW COMPARISON:  Prior radiograph from 01/17/2017. FINDINGS: Patient is rotated to the left. Cardiomegaly is unchanged. Mediastinal silhouette unchanged allowing for patient rotation. Aortic atherosclerosis. Left-sided [place] Free transvenous pacemaker/ AICD noted. Defibrillator pad overlies left chest. Lungs hypoinflated. Mild perihilar vascular congestion without overt pulmonary edema. No focal infiltrates. No significant pleural effusion. No pneumothorax. No acute osseus abnormality.  Osteopenia noted. IMPRESSION: 1. Stable cardiomegaly with mild perihilar vascular congestion without pulmonary edema. 2. No other active cardiopulmonary disease. Electronically Signed   By: Jeannine Boga M.D.   On: 07/07/2017 21:15   Dg Swallowing Func-speech Pathology  Result Date: 07/10/2017 Objective Swallowing Evaluation: Type of Study: MBS-Modified Barium Swallow Study Patient Details Name: JERMERY CARATACHEA MRN: [id number] Date of Birth: [birth year] Today's Date: 07/10/2017 Time: SLP Start Time (ACUTE ONLY): 1041-SLP Stop Time (ACUTE ONLY): 1107 SLP Time Calculation (min) (ACUTE ONLY): 26 min Past Medical History: Past Medical History: Diagnosis Date . Chronic kidney disease   stage 3 GFR 30-59 ml/min  . Dysphagia as late effect of cerebrovascular disease   pts wife states pt has to eat soft foods  . GERD (gastroesophageal reflux disease)  . Glaucoma  . High cholesterol  . Hypertension  . Neuromuscular disorder ([place])   chronic inflammatory demyelinating polyneuropathy  . Stroke [facility])   2011 with residual deficit left sided weakness . Tobacco dependence  Past Surgical History: Past Surgical History: Procedure Laterality Date . EYE SURGERY   . INGUINAL HERNIA REPAIR Right 11/23/2014  Procedure: right inguinal hernia repair with mesh;  Surgeon: Armandina Gemma, MD;  Location: [facility];  Service: General;  Laterality: Right; . INSERTION OF MESH N/A  11/23/2014  Procedure: INSERTION OF MESH;  Surgeon: Armandina Gemma, MD;  Location: [facility];  Service: General;  Laterality: N/A; . PACEMAKER IMPLANT N/A 01/16/2017  Procedure: Pacemaker Implant;  Surgeon: Will Meredith Leeds, MD;  Location: [facility];  Service: Cardiovascular;  Laterality: N/A; . SHOULDER SURGERY Bilateral 1988, 4962 HPI: 66 year old history of prior CVA, CK D, dysphagia, GERD, HLD, HTN presents with new onset seizures. Treated with Versed and then loaded with Depakote in ER. ER M.D. discuss case with neurology who recommends admission to medicine for EEG in a.m. recommended initially MRI unable to obtain as patient does have history of pacemaker.  CT head unremarkable.   Subjective: pt drowsy this morning Assessment / Plan / Recommendation CHL IP CLINICAL IMPRESSIONS 07/10/2017 Clinical Impression Pt has a mild-moderate oropharyngeal dysphagia that seems relatively similar to previous MBS in 2015, although it is also impacted by pt's lethargy this morning (may be medication related per MD note). Pt has decreased oral cohesion with some lingual rocking, slow transit, and lingual residue mostly with solids. He has somewhat passive spillage into his pharynx, with solids and liquids cascading from his base of tongue down into the pyriform sinuses before his swallow is triggered. Even with large, consecutive straw sips of thin liquids in isolation, he has only occasional, trace penetration that he clears with a cued throat clear. However, once pt has oral residue from solids in his mouth and along his  base of tongue, it interferes with his ability to contain thin liquids, allowing them to spill silently into his trachea before the swallow. Recommend to continue current orders for Dsy 2 diet and thin liquids, but would avoid any mixed consistencies and would NOT use any liquid washes for clearing solids from his oral cavity. Purees and lingual sweeps could be used instead. His wife was present for  study and education and verbalized her understanding as well. SLP also reinforced the importance of oral care to help reduce the risk of aspiration-related infections. SLP Visit Diagnosis Dysphagia, oropharyngeal phase (R13.12) Attention and concentration deficit following -- Frontal lobe and executive function deficit following -- Impact on safety and function Mild aspiration risk   CHL IP TREATMENT RECOMMENDATION 07/10/2017 Treatment Recommendations Therapy as outlined in treatment plan below   Prognosis 07/10/2017 Prognosis for Safe Diet Advancement Fair Barriers to Reach Goals Cognitive deficits;Time post onset Barriers/Prognosis Comment -- CHL IP DIET RECOMMENDATION 07/10/2017 SLP Diet Recommendations Dysphagia 2 (Fine chop) solids;Thin liquid Liquid Administration via Straw Medication Administration Whole meds with puree Compensations Slow rate;Small sips/bites;Minimize environmental distractions;Lingual sweep for clearance of pocketing;Clear throat intermittently Postural Changes Remain semi-upright after after feeds/meals (Comment);Seated upright at 90 degrees   CHL IP OTHER RECOMMENDATIONS 07/10/2017 Recommended Consults -- Oral Care Recommendations Oral care BID Other Recommendations Have oral suction available   CHL IP FOLLOW UP RECOMMENDATIONS 07/10/2017 Follow up Recommendations Inpatient Rehab   CHL IP FREQUENCY AND DURATION 07/10/2017 Speech Therapy Frequency (ACUTE ONLY) min 2x/week Treatment Duration 2 weeks      CHL IP ORAL PHASE 07/10/2017 Oral Phase Impaired Oral - Pudding Teaspoon -- Oral - Pudding Cup -- Oral - Honey Teaspoon -- Oral - Honey Cup -- Oral - Nectar Teaspoon -- Oral - Nectar Cup -- Oral - Nectar Straw Weak lingual manipulation;Lingual pumping;Reduced posterior propulsion;Delayed oral transit;Decreased bolus cohesion Oral - Thin Teaspoon -- Oral - Thin Cup -- Oral - Thin Straw Weak lingual manipulation;Lingual pumping;Reduced posterior propulsion;Delayed oral transit;Decreased bolus  cohesion Oral - Puree Weak lingual manipulation;Lingual pumping;Reduced posterior propulsion;Delayed oral transit;Decreased bolus cohesion Oral - Mech Soft Weak lingual manipulation;Lingual pumping;Reduced posterior propulsion;Delayed oral transit;Decreased bolus cohesion;Impaired mastication;Lingual/palatal residue Oral - Regular -- Oral - Multi-Consistency -- Oral - Pill -- Oral Phase - Comment --  CHL IP PHARYNGEAL PHASE 07/10/2017 Pharyngeal Phase Impaired Pharyngeal- Pudding Teaspoon -- Pharyngeal -- Pharyngeal- Pudding Cup -- Pharyngeal -- Pharyngeal- Honey Teaspoon -- Pharyngeal -- Pharyngeal- Honey Cup -- Pharyngeal -- Pharyngeal- Nectar Teaspoon -- Pharyngeal -- Pharyngeal- Nectar Cup -- Pharyngeal -- Pharyngeal- Nectar Straw Delayed swallow initiation-pyriform sinuses Pharyngeal -- Pharyngeal- Thin Teaspoon -- Pharyngeal -- Pharyngeal- Thin Cup -- Pharyngeal -- Pharyngeal- Thin Straw Delayed swallow initiation-pyriform sinuses;Penetration/Aspiration before swallow Pharyngeal Material enters airway, passes BELOW cords without attempt by patient to eject out (silent aspiration);Material enters airway, remains ABOVE vocal cords and not ejected out Pharyngeal- Puree Delayed swallow initiation-pyriform sinuses Pharyngeal -- Pharyngeal- Mechanical Soft Delayed swallow initiation-pyriform sinuses Pharyngeal -- Pharyngeal- Regular -- Pharyngeal -- Pharyngeal- Multi-consistency -- Pharyngeal -- Pharyngeal- Pill -- Pharyngeal -- Pharyngeal Comment --  CHL IP CERVICAL ESOPHAGEAL PHASE 07/10/2017 Cervical Esophageal Phase WFL Pudding Teaspoon -- Pudding Cup -- Honey Teaspoon -- Honey Cup -- Nectar Teaspoon -- Nectar Cup -- Nectar Straw -- Thin Teaspoon -- Thin Cup -- Thin Straw -- Puree -- Mechanical Soft -- Regular -- Multi-consistency -- Pill -- Cervical Esophageal Comment -- CHL IP GO 11/05/2013 Functional Assessment Tool Used mbs, clinical judgement Functional Limitations Swallowing Swallow Current Status (  B0488)  CJ Swallow Goal Status (Q9169) [place] Swallow Discharge Status [phone]) CJ Motor Speech Current Status [phone]) (None) Motor Speech Goal Status [phone]) (None) Motor Speech Goal Status [phone]) (None) Spoken Language Comprehension Current Status [phone]) (None) Spoken Language Comprehension Goal Status (Y8016) (None) Spoken Language Comprehension Discharge Status [phone]) (None) Spoken Language Expression Current Status [phone]) (None) Spoken Language Expression Goal Status (E6754) (None) Spoken Language Expression Discharge Status [phone]) (None) Attention Current Status (E0712) (None) Attention Goal Status (R9758) (None) Attention Discharge Status [phone]) (None) Memory Current Status (D8264) (None) Memory Goal Status (B5830) (None) Memory Discharge Status (N4076) (None) Voice Current Status (K0881) (None) Voice Goal Status (J0315) (None) Voice Discharge Status (X4585) (None) Other Speech-Language Pathology Functional Limitation Current Status (F2924) (None) Other Speech-Language Pathology Functional Limitation Goal Status (M6286) (None) Other Speech-Language Pathology Functional Limitation Discharge Status [phone]) (None) [name] 07/10/2017, 12:12 PM  [name], M.A. CCC-SLP [phone]              Assessment and Plan  UTI, probable/ELEVATED WBC/ACUTE RENAL FAILURE-we'll start IV fluids with normal saline at 75 mL an hour to go continuously through the weekend and into Monday; BMP every morning to follow renal function; I called infectious disease on-call Dr Linus Salmons to discuss his white count of 28.6 with a left shift; I told Dr. Linus Salmons for that I was comfortable with treating patient with IV fluids, to get his creatinine back down to normal but I didn't know if I should cover him with broad-spectrum antibiotics based on his white count; Dr Linus Salmons encouraged me to treat the infection and the patient, not the white count. I have ordered a CBC every morning along with a BMP every morning; will continue to  monitor for improvement or worsening     Time spent greater than 35 minutes;> 50% of time with patient was spent reviewing records, labs, tests and studies, counseling and developing plan of care  Webb Silversmith D. Sheppard Coil, MD

## 2017-07-21 ENCOUNTER — Encounter: Payer: Self-pay | Admitting: Internal Medicine

## 2017-07-21 DIAGNOSIS — K219 Gastro-esophageal reflux disease without esophagitis: Secondary | ICD-10-CM | POA: Insufficient documentation

## 2017-07-21 DIAGNOSIS — E782 Mixed hyperlipidemia: Secondary | ICD-10-CM | POA: Insufficient documentation

## 2017-07-21 DIAGNOSIS — E785 Hyperlipidemia, unspecified: Secondary | ICD-10-CM | POA: Insufficient documentation

## 2017-07-23 ENCOUNTER — Encounter: Payer: Self-pay | Admitting: Internal Medicine

## 2017-07-23 ENCOUNTER — Non-Acute Institutional Stay (SKILLED_NURSING_FACILITY): Payer: Medicare PPO | Admitting: Internal Medicine

## 2017-07-23 DIAGNOSIS — D72825 Bandemia: Secondary | ICD-10-CM

## 2017-07-23 DIAGNOSIS — N179 Acute kidney failure, unspecified: Secondary | ICD-10-CM

## 2017-07-23 DIAGNOSIS — N390 Urinary tract infection, site not specified: Secondary | ICD-10-CM

## 2017-07-23 DIAGNOSIS — B965 Pseudomonas (aeruginosa) (mallei) (pseudomallei) as the cause of diseases classified elsewhere: Secondary | ICD-10-CM

## 2017-07-23 NOTE — Progress Notes (Signed)
Location:  [place] Room Number: [place]:  [id number])  Provider: Noah Delaine. Sheppard Coil, MD  Boykin Nearing, MD  Patient Care Team: Boykin Nearing, MD as PCP - General (Family Medicine) Penni Bombard, MD as Consulting Physician (Neurology)  Extended Emergency Contact Information Primary Emergency Contact: [facility] Address: [address]          [place], [place] [address] Johnnette Litter of [place] Phone: [phone] Mobile Phone: [phone] Relation: Spouse    Allergies: Doxycycline; Atacand hct [candesartan cilexetil-hctz]; and Shellfish allergy  Chief Complaint  Patient presents with  . Acute Visit    UTI    HPI: Patient is 66 y.o. male who is being seen today in follow-up for his illness over the weekend. Per patient's wife patient looks great today, he is sitting up, eating, talking. His IV came out but he had a total of 2 L and he is drinking very well today. Patient's white count has come down from 28.6 on Friday to 13.4 on 10/28 and his creatinine has improved from 3.14 down to 2.15 on 10/28, which was yesterday. Patient's  Urine has grown out greater than 100,000 of Pseudomonas.  Past Medical History:  Diagnosis Date  . At high risk for falls 08/16/2015  . Bradycardia   . Chronic kidney disease    stage 3 GFR 30-59 ml/min   . CIDP (chronic inflammatory demyelinating polyneuropathy) ([place])   . CKD (chronic kidney disease) stage 3, GFR 30-59 ml/min ([name]) 08/16/2015  . Dysphagia as late effect of cerebrovascular disease    pts wife states pt has to eat soft foods   . Elevated liver enzymes 08/10/2016  . GERD (gastroesophageal reflux disease)   . Glaucoma   . High cholesterol   . History of CVA with residual deficit 03/25/2013  . Hypertension   . Hypertensive retinopathy of both eyes 01/16/2017  . Inguinal hernia 03/25/2013  . Liver hemangioma 08/14/2016  . Neuromuscular disorder ([place])    chronic inflammatory  demyelinating polyneuropathy   . New onset seizure ([place]) 07/08/2017  . Nuclear sclerosis of both eyes 10/25/2016  . Primary open angle glaucoma of both eyes, indeterminate stage 10/25/2016  . Renal mass, right 08/14/2016  . Status cardiac pacemaker 01/29/2017   Placed for second degree heart block on 01/16/17 Medtronic Azure XT DR MRI SureScan dual-chamber pacemaker  . Stroke [facility])    2011 with residual deficit left sided weakness  . Tobacco dependence     Past Surgical History:  Procedure Laterality Date  . EYE SURGERY    . INGUINAL HERNIA REPAIR Right 11/23/2014   Procedure: right inguinal hernia repair with mesh;  Surgeon: Armandina Gemma, MD;  Location: [facility];  Service: General;  Laterality: Right;  . INSERTION OF MESH N/A 11/23/2014   Procedure: INSERTION OF MESH;  Surgeon: Armandina Gemma, MD;  Location: [facility];  Service: General;  Laterality: N/A;  . PACEMAKER IMPLANT N/A 01/16/2017   Procedure: Pacemaker Implant;  Surgeon: Will Meredith Leeds, MD;  Location: [place] CV LAB;  Service: Cardiovascular;  Laterality: N/A;  . SHOULDER SURGERY Bilateral 1988, 1998    Allergies as of 07/23/2017      Reactions   Doxycycline Other (See Comments)   Hiccups, cough, nausea and emesis, elevated liver enzymes, elevated eosinophils, SOB concerning for early DRESS syndrome    Atacand Hct [candesartan Cilexetil-hctz] Hives   Shellfish Allergy Hives      Medication List  Accurate as of 07/23/17  3:09 PM. Always use your most recent med list.          acetaminophen 325 MG tablet Commonly known as:  TYLENOL Take 650 mg by mouth. Take 2 tablets every 6 hours as needed for fever greater than 101.   amLODipine 10 MG tablet Commonly known as:  NORVASC Take 1 tablet (10 mg total) by mouth every morning.   aspirin EC 81 MG tablet Take 1 tablet (81 mg total) by mouth daily.   atorvastatin 40 MG tablet Commonly known as:  LIPITOR Take 1 tablet (40 mg total) by mouth daily.   brimonidine  0.1 % Soln Commonly known as:  ALPHAGAN P Place 1 drop into both eyes 2 (two) times daily.   ciprofloxacin 250 MG tablet Commonly known as:  CIPRO Take 250 mg by mouth. Take one tablet every 12 hours for UTI. Stop 07/29/17   DAILY VITAMIN FORMULA+IRON Tabs Take 1 tablet by mouth daily.   divalproex 500 MG DR tablet Commonly known as:  DEPAKOTE Take 1 tablet (500 mg total) by mouth 2 (two) times daily.   dorzolamide-timolol 22.3-6.8 MG/ML ophthalmic solution Commonly known as:  COSOPT Place 1 drop into both eyes 2 (two) times daily.   EQ DAILY FIBER PO Take 1 tablet by mouth daily.   esomeprazole 20 MG capsule Commonly known as:  NEXIUM Take 20 mg by mouth daily at 12 noon.   fluticasone 50 MCG/ACT nasal spray Commonly known as:  FLONASE Place 2 sprays into both nostrils daily.   folic acid 1 MG tablet Commonly known as:  FOLVITE Take 1 tablet (1 mg total) by mouth daily.   hydrALAZINE 50 MG tablet Commonly known as:  APRESOLINE Take 1 tablet (50 mg total) by mouth 3 (three) times daily.   ketoconazole 2 % cream Commonly known as:  NIZORAL Apply 1 application topically daily as needed for irritation.   latanoprost 0.005 % ophthalmic solution Commonly known as:  XALATAN Place 1 drop into both eyes at bedtime.   lisinopril 20 MG tablet Commonly known as:  PRINIVIL,ZESTRIL Take 1 tablet (20 mg total) by mouth daily.   metoprolol tartrate 25 MG tablet Commonly known as:  LOPRESSOR Take 1 tablet (25 mg total) by mouth 2 (two) times daily.   nicotine 21 mg/24hr patch Commonly known as:  NICODERM CQ - dosed in mg/24 hours Place 21 mg onto the skin. Apply patch topically onto skin daily for 4 weeks. Stop 08/14/17   terbinafine 1 % cream Commonly known as:  LAMISIL AT Apply 1 application topically 2 (two) times daily. To R foot   thiamine 100 MG tablet Take 1 tablet (100 mg total) by mouth daily.   torsemide 20 MG tablet Commonly known as:  DEMADEX Take 20 mg  by mouth daily.       Meds ordered this encounter  Medications  . ciprofloxacin (CIPRO) 250 MG tablet    Sig: Take 250 mg by mouth. Take one tablet every 12 hours for UTI. Stop 07/29/17  . acetaminophen (TYLENOL) 325 MG tablet    Sig: Take 650 mg by mouth. Take 2 tablets every 6 hours as needed for fever greater than 101.    Immunization History  Administered Date(s) Administered  . Influenza,inj,Quad PF,6+ Mos 09/21/2014, 08/16/2015, 05/25/2016  . Pneumococcal Polysaccharide-23 09/21/2014  . Tdap 11/25/2015    Social History  Substance Use Topics  . Smoking status: Current Every Day Smoker    Packs/day: 1.00  Years: 40.00    Types: Cigarettes  . Smokeless tobacco: Never Used  . Alcohol use No     Comment: alcohol free for 1 year was drinking 1/2 pint per day     Review of Systems  DATA OBTAINED: from patient, nurse,and wife GENERAL:  no fevers, fatigue, appetite changes SKIN: No itching, rash HEENT: No complaint RESPIRATORY: No cough, wheezing, SOB CARDIAC: No chest pain, palpitations, lower extremity edema  GI: No abdominal pain, No N/V/D or constipation, No heartburn or reflux  GU: No dysuria, frequency or urgency, or incontinence  MUSCULOSKELETAL: No unrelieved bone/joint pain NEUROLOGIC: No headache, dizziness  PSYCHIATRIC: No overt anxiety or sadness  Vitals:   07/23/17 1456  BP: (!) 197/71  Pulse: (!) 59  Resp: 20  Temp: 98.4 F (36.9 C)   Body mass index is 20.69 kg/m. Physical Exam  GENERAL APPEARANCE: Alert,  No acute distress  SKIN: No diaphoresis rash HEENT: Unremarkable RESPIRATORY: Breathing is even, unlabored. Lung sounds are clear   CARDIOVASCULAR: Heart RRR no murmurs, rubs or gallops. No peripheral edema  GASTROINTESTINAL: Abdomen is soft, non-tender, not distended w/ normal bowel sounds.  GENITOURINARY: Bladder non tender, not distended  MUSCULOSKELETAL: No abnormal joints or musculature NEUROLOGIC: Cranial nerves 2-12 grossly  intact;left sided weakness PSYCHIATRIC: Mood and affect appropriate to situation, no behavioral issues  Patient Active Problem List   Diagnosis Date Noted  . GERD (gastroesophageal reflux disease) 07/21/2017  . Hyperlipidemia 07/21/2017  . CIDP (chronic inflammatory demyelinating polyneuropathy) ([place])   . Bradycardia   . Acute blood loss anemia   . New onset seizure ([place]) 07/08/2017  . Status cardiac pacemaker 01/29/2017  . Unintended weight loss 01/29/2017  . Tinea pedis of right foot 01/29/2017  . Hypertensive retinopathy of both eyes 01/16/2017  . Hypercalcemia 01/15/2017  . Heart block 01/15/2017  . Nuclear sclerosis of both eyes 10/25/2016  . Primary open angle glaucoma of both eyes, indeterminate stage 10/25/2016  . Constipation 09/12/2016  . Glaucoma 09/12/2016  . Liver hemangioma 08/14/2016  . Renal cyst 08/14/2016  . Renal mass, right 08/14/2016  . Elevated liver enzymes 08/10/2016  . CKD (chronic kidney disease) stage 3, GFR 30-59 ml/min ([name]) 08/16/2015  . At high risk for falls 08/16/2015  . Subcutaneous nodules 08/16/2015  . Prediabetes 10/07/2013  . Inguinal hernia 03/25/2013  . History of CVA with residual deficit 03/25/2013  . HTN (hypertension) 10/09/2012  . Tobacco abuse 10/09/2012    CMP     Component Value Date/Time   NA 139 07/12/2017 0521   NA 138 02/09/2017 1117   K 4.5 07/12/2017 0521   CL 111 07/12/2017 0521   CO2 20 (L) 07/12/2017 0521   GLUCOSE 78 07/12/2017 0521   BUN 13 07/12/2017 0521   BUN 28 (H) 02/09/2017 1117   CREATININE 1.84 (H) 07/12/2017 0521   CREATININE 1.45 (H) 08/10/2016 1647   CALCIUM 9.3 07/12/2017 0521   PROT 5.5 (L) 07/09/2017 0529   PROT 7.1 10/20/2013 1443   ALBUMIN 2.9 (L) 07/09/2017 0529   AST 13 (L) 07/09/2017 0529   ALT 8 (L) 07/09/2017 0529   ALKPHOS 81 07/09/2017 0529   BILITOT 0.7 07/09/2017 0529   GFRNONAA 37 (L) 07/12/2017 0521   GFRNONAA 51 (L) 08/10/2016 1647   GFRAA 43 (L) 07/12/2017 0521   GFRAA 58  (L) 08/10/2016 1647    Recent Labs  07/07/17 2100 07/08/17 1328 07/09/17 0529 07/10/17 0603 07/11/17 0504 07/12/17 0521  NA 136 136 138 140 140 139  K 4.4 4.3 4.1 4.3 4.5 4.5  CL 104 108 108 112* 113* 111  CO2 20* 22 24 22 22  20*  GLUCOSE 101* 162* 84 72 81 78  BUN 17 18 17 14 12 13   CREATININE 1.85* 2.07* 1.92* 1.77* 1.83* 1.84*  CALCIUM 9.7 9.2 9.2 9.5 9.1 9.3  MG 2.0 1.8 1.6*  --   --   --   PHOS  --  2.6  --   --   --   --     Recent Labs  01/15/17 2348 07/07/17 2100 07/09/17 0529  AST 12* 18 13*  ALT 9* 6* 8*  ALKPHOS 80 95 81  BILITOT 0.7 0.4 0.7  PROT 6.5 6.5 5.5*  ALBUMIN 3.5 3.4* 2.9*    Recent Labs  08/04/16 1558  02/09/17 1117 07/07/17 2100  07/10/17 0603 07/11/17 0504 07/12/17 0521  WBC 15.8*  < > 7.6 9.8  < > 7.5 7.3 7.4  NEUTROABS 11.8*  --  4.6 5.1  --   --   --   --   HGB 11.7*  < > 13.3 12.2*  < > 11.1* 11.1* 11.3*  HCT 34.7*  < > 39.7 38.5*  < > 34.9* 34.1* 34.9*  MCV 87.8  < > 85 90.2  < > 89.9 89.7 90.2  PLT 348  < > 309 275  < > 240 236 242  < > = values in this interval not displayed. No results for input(s): CHOL, LDLCALC, TRIG in the last 8760 hours.  Invalid input(s): HCL No results found for: [facility] Lab Results  Component Value Date   TSH 0.476 07/08/2017   Lab Results  Component Value Date   HGBA1C 5.3 10/07/2013   Lab Results  Component Value Date   CHOL 141 08/16/2015   HDL 34 (L) 08/16/2015   LDLCALC 84 08/16/2015   TRIG 116 08/16/2015   CHOLHDL 4.1 08/16/2015    Significant Diagnostic Results in last 30 days:  Ct Head Wo Contrast  Result Date: 07/07/2017 CLINICAL DATA:  Acute onset of seizure.  Initial encounter. EXAM: CT HEAD WITHOUT CONTRAST TECHNIQUE: Contiguous axial images were obtained from the base of the skull through the vertex without intravenous contrast. COMPARISON:  CT of the head performed 12/06/2016 FINDINGS: Brain: No evidence of acute infarction, hemorrhage, hydrocephalus, extra-axial  collection or mass lesion/mass effect. Prominence of the ventricles and sulci reflects mild cortical volume loss. A chronic infarct noted at the right occipital lobe, with associated encephalomalacia. Diffuse periventricular and subcortical white matter likely reflects small vessel ischemic microangiopathy. Chronic lacunar infarcts are noted at the right basal ganglia and bilateral thalami. Cerebellar atrophy is noted. The brainstem and fourth ventricle are within normal limits. No mass effect or midline shift is seen. Vascular: No hyperdense vessel or unexpected calcification. Skull: There is no evidence of fracture; visualized osseous structures are unremarkable in appearance. Sinuses/Orbits: The orbits are within normal limits. A mucus retention cyst or polyp is noted at the left maxillary sinus. The remaining paranasal sinuses and mastoid air cells are well-aerated. Other: No significant soft tissue abnormalities are seen. IMPRESSION: 1. No acute intracranial pathology seen on CT. 2. Mild cortical volume loss and diffuse small vessel ischemic microangiopathy. 3. Chronic infarct at the right occipital lobe, with associated encephalomalacia. Chronic lacunar infarcts at the right basal ganglia and bilateral thalami. 4. Mucus retention cyst or polyp at the left maxillary sinus. Electronically Signed   By: Garald Balding M.D.   On: 07/07/2017 21:49  Mr Brain Wo Contrast (neuro Protocol)  Result Date: 07/09/2017 CLINICAL DATA:  Seizure and altered mental status EXAM: MRI HEAD WITHOUT CONTRAST TECHNIQUE: Multiplanar, multiecho pulse sequences of the brain and surrounding structures were obtained without intravenous contrast. COMPARISON:  Head CT 07/07/2017 FINDINGS: The examination had to be discontinued prior to completion due to patient altered mental status and inability to cooperate with the technologist's instructions. Diffusion-weighted imaging shows no evidence of acute ischemia. There is advanced  bilateral white matter hyperintensity consistent with chronic ischemic microangiopathy. Old right basal ganglia lacunar infarct. No midline shift or other mass effect. IMPRESSION: Truncated and motion degraded examination. Within that limitation, no acute ischemia or significant mass effect. Advanced chronic microvascular disease. Electronically Signed   By: Ulyses Jarred M.D.   On: 07/09/2017 16:41   Dg Chest Port 1 View  Result Date: 07/07/2017 CLINICAL DATA:  Initial evaluation for acute seizure. EXAM: PORTABLE CHEST 1 VIEW COMPARISON:  Prior radiograph from 01/17/2017. FINDINGS: Patient is rotated to the left. Cardiomegaly is unchanged. Mediastinal silhouette unchanged allowing for patient rotation. Aortic atherosclerosis. Left-sided [place] Free transvenous pacemaker/ AICD noted. Defibrillator pad overlies left chest. Lungs hypoinflated. Mild perihilar vascular congestion without overt pulmonary edema. No focal infiltrates. No significant pleural effusion. No pneumothorax. No acute osseus abnormality.  Osteopenia noted. IMPRESSION: 1. Stable cardiomegaly with mild perihilar vascular congestion without pulmonary edema. 2. No other active cardiopulmonary disease. Electronically Signed   By: Jeannine Boga M.D.   On: 07/07/2017 21:15   Dg Swallowing Func-speech Pathology  Result Date: 07/10/2017 Objective Swallowing Evaluation: Type of Study: MBS-Modified Barium Swallow Study Patient Details Name: QUINLIN CONANT MRN: [id number] Date of Birth: 1951-03-14 Today's Date: 07/10/2017 Time: SLP Start Time (ACUTE ONLY): 1041-SLP Stop Time (ACUTE ONLY): 1107 SLP Time Calculation (min) (ACUTE ONLY): 26 min Past Medical History: Past Medical History: Diagnosis Date . Chronic kidney disease   stage 3 GFR 30-59 ml/min  . Dysphagia as late effect of cerebrovascular disease   pts wife states pt has to eat soft foods  . GERD (gastroesophageal reflux disease)  . Glaucoma  . High cholesterol  . Hypertension  . Neuromuscular  disorder ([place])   chronic inflammatory demyelinating polyneuropathy  . Stroke [facility])   2011 with residual deficit left sided weakness . Tobacco dependence  Past Surgical History: Past Surgical History: Procedure Laterality Date . EYE SURGERY   . INGUINAL HERNIA REPAIR Right 11/23/2014  Procedure: right inguinal hernia repair with mesh;  Surgeon: Armandina Gemma, MD;  [facility];  Service: General;  Laterality: Right; . INSERTION OF MESH N/A 11/23/2014  Procedure: INSERTION OF MESH;  Surgeon: Armandina Gemma, MD;  Location: [facility];  Service: General;  Laterality: N/A; . PACEMAKER IMPLANT N/A 01/16/2017  Procedure: Pacemaker Implant;  Surgeon: Will Meredith Leeds, MD;  Location: [place] [facility];  Service: Cardiovascular;  Laterality: N/A; . SHOULDER SURGERY Bilateral 1988, 4118 HPI: 66 year old history of prior CVA, CK D, dysphagia, GERD, HLD, HTN presents with new onset seizures. Treated with Versed and then loaded with Depakote in ER. ER M.D. discuss case with neurology who recommends admission to medicine for EEG in a.m. recommended initially MRI unable to obtain as patient does have history of pacemaker.  CT head unremarkable.   Subjective: pt drowsy this morning Assessment / Plan / Recommendation CHL IP CLINICAL IMPRESSIONS 07/10/2017 Clinical Impression Pt has a mild-moderate oropharyngeal dysphagia that seems relatively similar to previous MBS in 2015, although it is also impacted by pt's lethargy this morning (may be  medication related per MD note). Pt has decreased oral cohesion with some lingual rocking, slow transit, and lingual residue mostly with solids. He has somewhat passive spillage into his pharynx, with solids and liquids cascading from his base of tongue down into the pyriform sinuses before his swallow is triggered. Even with large, consecutive straw sips of thin liquids in isolation, he has only occasional, trace penetration that he clears with a cued throat clear. However, once pt has oral residue  from solids in his mouth and along his base of tongue, it interferes with his ability to contain thin liquids, allowing them to spill silently into his trachea before the swallow. Recommend to continue current orders for Dsy 2 diet and thin liquids, but would avoid any mixed consistencies and would NOT use any liquid washes for clearing solids from his oral cavity. Purees and lingual sweeps could be used instead. His wife was present for study and education and verbalized her understanding as well. SLP also reinforced the importance of oral care to help reduce the risk of aspiration-related infections. SLP Visit Diagnosis Dysphagia, oropharyngeal phase (R13.12) Attention and concentration deficit following -- Frontal lobe and executive function deficit following -- Impact on safety and function Mild aspiration risk   CHL IP TREATMENT RECOMMENDATION 07/10/2017 Treatment Recommendations Therapy as outlined in treatment plan below   Prognosis 07/10/2017 Prognosis for Safe Diet Advancement Fair Barriers to Reach Goals Cognitive deficits;Time post onset Barriers/Prognosis Comment -- CHL IP DIET RECOMMENDATION 07/10/2017 SLP Diet Recommendations Dysphagia 2 (Fine chop) solids;Thin liquid Liquid Administration via Straw Medication Administration Whole meds with puree Compensations Slow rate;Small sips/bites;Minimize environmental distractions;Lingual sweep for clearance of pocketing;Clear throat intermittently Postural Changes Remain semi-upright after after feeds/meals (Comment);Seated upright at 90 degrees   CHL IP OTHER RECOMMENDATIONS 07/10/2017 Recommended Consults -- Oral Care Recommendations Oral care BID Other Recommendations Have oral suction available   CHL IP FOLLOW UP RECOMMENDATIONS 07/10/2017 Follow up Recommendations Inpatient Rehab   CHL IP FREQUENCY AND DURATION 07/10/2017 Speech Therapy Frequency (ACUTE ONLY) min 2x/week Treatment Duration 2 weeks      CHL IP ORAL PHASE 07/10/2017 Oral Phase Impaired Oral  - Pudding Teaspoon -- Oral - Pudding Cup -- Oral - Honey Teaspoon -- Oral - Honey Cup -- Oral - Nectar Teaspoon -- Oral - Nectar Cup -- Oral - Nectar Straw Weak lingual manipulation;Lingual pumping;Reduced posterior propulsion;Delayed oral transit;Decreased bolus cohesion Oral - Thin Teaspoon -- Oral - Thin Cup -- Oral - Thin Straw Weak lingual manipulation;Lingual pumping;Reduced posterior propulsion;Delayed oral transit;Decreased bolus cohesion Oral - Puree Weak lingual manipulation;Lingual pumping;Reduced posterior propulsion;Delayed oral transit;Decreased bolus cohesion Oral - Mech Soft Weak lingual manipulation;Lingual pumping;Reduced posterior propulsion;Delayed oral transit;Decreased bolus cohesion;Impaired mastication;Lingual/palatal residue Oral - Regular -- Oral - Multi-Consistency -- Oral - Pill -- Oral Phase - Comment --  CHL IP PHARYNGEAL PHASE 07/10/2017 Pharyngeal Phase Impaired Pharyngeal- Pudding Teaspoon -- Pharyngeal -- Pharyngeal- Pudding Cup -- Pharyngeal -- Pharyngeal- Honey Teaspoon -- Pharyngeal -- Pharyngeal- Honey Cup -- Pharyngeal -- Pharyngeal- Nectar Teaspoon -- Pharyngeal -- Pharyngeal- Nectar Cup -- Pharyngeal -- Pharyngeal- Nectar Straw Delayed swallow initiation-pyriform sinuses Pharyngeal -- Pharyngeal- Thin Teaspoon -- Pharyngeal -- Pharyngeal- Thin Cup -- Pharyngeal -- Pharyngeal- Thin Straw Delayed swallow initiation-pyriform sinuses;Penetration/Aspiration before swallow Pharyngeal Material enters airway, passes BELOW cords without attempt by patient to eject out (silent aspiration);Material enters airway, remains ABOVE vocal cords and not ejected out Pharyngeal- Puree Delayed swallow initiation-pyriform sinuses Pharyngeal -- Pharyngeal- Mechanical Soft Delayed swallow initiation-pyriform sinuses Pharyngeal -- Pharyngeal- Regular --  Pharyngeal -- Pharyngeal- Multi-consistency -- Pharyngeal -- Pharyngeal- Pill -- Pharyngeal -- Pharyngeal Comment --  CHL IP CERVICAL ESOPHAGEAL  PHASE 07/10/2017 Cervical Esophageal Phase WFL Pudding Teaspoon -- Pudding Cup -- Honey Teaspoon -- Honey Cup -- Nectar Teaspoon -- Nectar Cup -- Nectar Straw -- Thin Teaspoon -- Thin Cup -- Thin Straw -- Puree -- Mechanical Soft -- Regular -- Multi-consistency -- Pill -- Cervical Esophageal Comment -- CHL IP GO 11/05/2013 Functional Assessment Tool Used mbs, clinical judgement Functional Limitations Swallowing Swallow Current Status (Q9476) CJ Swallow Goal Status (L4650) CJ Swallow Discharge Status (P5465) CJ Motor Speech Current Status (K8127) (None) Motor Speech Goal Status (N1700) (None) Motor Speech Goal Status (F7494) (None) Spoken Language Comprehension Current Status (W9675) (None) Spoken Language Comprehension Goal Status (F1638) (None) Spoken Language Comprehension Discharge Status (G6659) (None) Spoken Language Expression Current Status (D3570) (None) Spoken Language Expression Goal Status (V7793) (None) Spoken Language Expression Discharge Status (J0300) (None) Attention Current Status (P2330) (None) Attention Goal Status (Q7622) (None) Attention Discharge Status (Q3335) (None) Memory Current Status (K5625) (None) Memory Goal Status (W3893) (None) Memory Discharge Status (T3428) (None) Voice Current Status (J6811) (None) Voice Goal Status (X7262) (None) Voice Discharge Status (M3559) (None) Other Speech-Language Pathology Functional Limitation Current Status (R4163) (None) Other Speech-Language Pathology Functional Limitation Goal Status (A4536) (None) Other Speech-Language Pathology Functional Limitation Discharge Status [phone]) (None) [name] 07/10/2017, 12:12 PM  [name], M.A. CCC-SLP [phone]              Assessment and Plan  PSEUDOMONAS UTI-pansensitive, sens to fourth and third generation cephalosporins; we will DC Rocephin;  calculated creatinine clearance is 29.37-will start Cipro 250 mg every 12 hours 5 more  days   ACUTE KIDNEY INJURY- creatinine has fallen from  3.14 to  2.15 from 10/25-10/28; left was morning have not yet returned; patient is drinking well today and we'll continue to monitor  Elevated WBC-as predicted patient's white count has come from 28.6  To  23   To 13.4  Yesterday; today's labs have not yet arrived expected to be even better we'll continue to monitor     Lexus Barletta D. Sheppard Coil, MD

## 2017-07-24 NOTE — Telephone Encounter (Signed)
LVM for Darrell [name] to call device clinic back regarding pts transmission.

## 2017-07-24 NOTE — Telephone Encounter (Signed)
Spoke with pts wife and walked her through on how to send a manual transmission.

## 2017-07-29 ENCOUNTER — Encounter: Payer: Self-pay | Admitting: Internal Medicine

## 2017-07-30 ENCOUNTER — Telehealth: Payer: Self-pay | Admitting: *Deleted

## 2017-07-30 NOTE — Telephone Encounter (Signed)
Received call from patient's wife.  She wanted to confirm that transmission was received.  Confirmed that transmission was received on 07/24/17.  Patient's wife appreciative and denies questions or concerns at this time.

## 2017-07-31 ENCOUNTER — Other Ambulatory Visit: Payer: Self-pay | Admitting: *Deleted

## 2017-07-31 DIAGNOSIS — I6930 Unspecified sequelae of cerebral infarction: Secondary | ICD-10-CM

## 2017-07-31 NOTE — Patient Outreach (Signed)
[facility]) Care Management  07/31/2017  MAYO FAULK [birth year] [id number]  Met with patient at facility.  He states that [facility] would need to speak with wife Damen Windsor.  Her number is [phone]  Plan to follow up with spouse.

## 2017-07-31 NOTE — Patient Outreach (Signed)
Spoke to spouse, [name].  She states that at this time patient is going home on 08/02/17. She reports patient will have home care services.  She does however need resource for a ramp.  Verbal consent obtained.  Plan to take packet to facility tomorrow. Plan to refer to [facility] LCSW for ramp resources.   Royetta Crochet. Laymond Purser, RN, BSN, [place] [phone]) Business Cell  [phone]) Toll Free Office

## 2017-08-01 ENCOUNTER — Other Ambulatory Visit: Payer: Self-pay | Admitting: *Deleted

## 2017-08-01 ENCOUNTER — Non-Acute Institutional Stay (SKILLED_NURSING_FACILITY): Payer: Medicare PPO | Admitting: Internal Medicine

## 2017-08-01 ENCOUNTER — Encounter: Payer: Self-pay | Admitting: Internal Medicine

## 2017-08-01 ENCOUNTER — Encounter: Payer: Self-pay | Admitting: *Deleted

## 2017-08-01 DIAGNOSIS — I10 Essential (primary) hypertension: Secondary | ICD-10-CM | POA: Diagnosis not present

## 2017-08-01 DIAGNOSIS — N183 Chronic kidney disease, stage 3 unspecified: Secondary | ICD-10-CM

## 2017-08-01 DIAGNOSIS — E785 Hyperlipidemia, unspecified: Secondary | ICD-10-CM | POA: Diagnosis not present

## 2017-08-01 DIAGNOSIS — G6181 Chronic inflammatory demyelinating polyneuritis: Secondary | ICD-10-CM | POA: Diagnosis not present

## 2017-08-01 DIAGNOSIS — Z950 Presence of cardiac pacemaker: Secondary | ICD-10-CM

## 2017-08-01 DIAGNOSIS — I6930 Unspecified sequelae of cerebral infarction: Secondary | ICD-10-CM

## 2017-08-01 DIAGNOSIS — R569 Unspecified convulsions: Secondary | ICD-10-CM

## 2017-08-01 DIAGNOSIS — H2513 Age-related nuclear cataract, bilateral: Secondary | ICD-10-CM

## 2017-08-01 DIAGNOSIS — R001 Bradycardia, unspecified: Secondary | ICD-10-CM | POA: Diagnosis not present

## 2017-08-01 DIAGNOSIS — H401134 Primary open-angle glaucoma, bilateral, indeterminate stage: Secondary | ICD-10-CM

## 2017-08-01 DIAGNOSIS — K219 Gastro-esophageal reflux disease without esophagitis: Secondary | ICD-10-CM

## 2017-08-01 NOTE — Progress Notes (Signed)
Location:  [place] Room Number: [facility] [facility]:  SNF [phone])  Provider: Noah Delaine. Sheppard Coil, MD  PCP: Boykin Nearing, MD Patient Care Team: Boykin Nearing, MD as PCP - General (Family Medicine) Penni Bombard, MD as Consulting Physician (Neurology) [place], Maree Erie, LCSW as [place] Management  Extended Emergency Contact Information Primary Emergency Contact: Belle Address: [address]          [place], [place] [address] Johnnette Litter of [place] Phone: [phone] Mobile Phone: [phone] Relation: Spouse  Allergies  Allergen Reactions  . Doxycycline Other (See Comments)    Hiccups, cough, nausea and emesis, elevated liver enzymes, elevated eosinophils, SOB concerning for early DRESS syndrome   . Atacand Hct [Candesartan Cilexetil-Hctz] Hives  . Shellfish Allergy Hives    Chief Complaint  Patient presents with  . Discharge Note    discharge from SNF to home    HPI:  66 y.o. maleCVA with residual dysphagia, neuromuscular disorder, hypertension, symptomatically bradycardia status post pacemaker 12/2016, hyperlipidemia, tobacco abuse, chronic kidney disease who was admitted to [facility] from 10/13[facility]18 for new onset seizure in the setting of a prior CVA. Patient initially placed on Keppra but became drowsy and was transitioned to Depakote. Patient was admitted to skilled nursing facility with generalized weakness for OT/PT and is now ready to be discharged to home.    Past Medical History:  Diagnosis Date  . At high risk for falls 08/16/2015  . Bradycardia   . Chronic kidney disease    stage 3 GFR 30-59 ml/min   . CIDP (chronic inflammatory demyelinating polyneuropathy) ([place])   . CKD (chronic kidney disease) stage 3, GFR 30-59 ml/min ([name]) 08/16/2015  . Dysphagia as late effect of cerebrovascular disease    pts wife states pt has to eat soft foods   . Elevated liver enzymes  08/10/2016  . GERD (gastroesophageal reflux disease)   . Glaucoma   . High cholesterol   . History of CVA with residual deficit 03/25/2013  . Hypertension   . Hypertensive retinopathy of both eyes 01/16/2017  . Inguinal hernia 03/25/2013  . Liver hemangioma 08/14/2016  . Neuromuscular disorder ([place])    chronic inflammatory demyelinating polyneuropathy   . New onset seizure ([place]) 07/08/2017  . Nuclear sclerosis of both eyes 10/25/2016  . Primary open angle glaucoma of both eyes, indeterminate stage 10/25/2016  . Renal mass, right 08/14/2016  . Status cardiac pacemaker 01/29/2017   Placed for second degree heart block on 01/16/17 Medtronic Azure XT DR MRI SureScan dual-chamber pacemaker  . Stroke [facility])    2011 with residual deficit left sided weakness  . Tobacco dependence     Past Surgical History:  Procedure Laterality Date  . EYE SURGERY    . SHOULDER SURGERY Bilateral 1988, 1998     reports that he has been smoking cigarettes.  He has a 40.00 pack-year smoking history. he has never used smokeless tobacco. He reports that he does not drink alcohol or use drugs. Social History   Socioeconomic History  . Marital status: Married    Spouse name: [name]  . Number of children: 1  . Years of education: college  . Highest education level: Not on file  Social Needs  . Financial resource strain: Not on file  . Food insecurity - worry: Not on file  . Food insecurity - inability: Not on file  . Transportation needs - medical: Not on file  . Transportation needs -  non-medical: Not on file  Occupational History  . Occupation: retired  Tobacco Use  . Smoking status: Current Every Day Smoker    Packs/day: 1.00    Years: 40.00    Pack years: 40.00    Types: Cigarettes  . Smokeless tobacco: Never Used  Substance and Sexual Activity  . Alcohol use: No    Comment: alcohol free for 1 year was drinking 1/2 pint per day   . Drug use: No  . Sexual activity: Not on file  Other Topics Concern    . Not on file  Social History Narrative   Patient lives at home with spouse.   Has 63 yo daughter   No grandchildren    Pertinent  Health Maintenance Due  Topic Date Due  . COLONOSCOPY  02/19/2002  . PNA vac Low Risk Adult (1 of 2 - PCV13) 02/19/2017  . INFLUENZA VACCINE  04/25/2017    Medications: Allergies as of 08/01/2017      Reactions   Doxycycline Other (See Comments)   Hiccups, cough, nausea and emesis, elevated liver enzymes, elevated eosinophils, SOB concerning for early DRESS syndrome    Atacand Hct [candesartan Cilexetil-hctz] Hives   Shellfish Allergy Hives      Medication List        Accurate as of 08/01/17  2:13 PM. Always use your most recent med list.          acetaminophen 325 MG tablet Commonly known as:  TYLENOL Take 650 mg by mouth. Take 2 tablets every 6 hours as needed for fever greater than 101.   amLODipine 10 MG tablet Commonly known as:  NORVASC Take 1 tablet (10 mg total) by mouth every morning.   aspirin EC 81 MG tablet Take 1 tablet (81 mg total) by mouth daily.   atorvastatin 40 MG tablet Commonly known as:  LIPITOR Take 1 tablet (40 mg total) by mouth daily.   brimonidine 0.1 % Soln Commonly known as:  ALPHAGAN P Place 1 drop into both eyes 2 (two) times daily.   DAILY VITAMIN FORMULA+IRON Tabs Take 1 tablet by mouth daily.   divalproex 500 MG DR tablet Commonly known as:  DEPAKOTE Take 1 tablet (500 mg total) by mouth 2 (two) times daily.   dorzolamide-timolol 22.3-6.8 MG/ML ophthalmic solution Commonly known as:  COSOPT Place 1 drop into both eyes 2 (two) times daily.   esomeprazole 20 MG capsule Commonly known as:  NEXIUM Take 20 mg by mouth daily at 12 noon.   fluticasone 50 MCG/ACT nasal spray Commonly known as:  FLONASE Place 2 sprays into both nostrils daily.   folic acid 1 MG tablet Commonly known as:  FOLVITE Take 1 tablet (1 mg total) by mouth daily.   hydrALAZINE 50 MG tablet Commonly known as:   APRESOLINE Take 1 tablet (50 mg total) by mouth 3 (three) times daily.   ketoconazole 2 % cream Commonly known as:  NIZORAL Apply 1 application topically daily as needed for irritation.   latanoprost 0.005 % ophthalmic solution Commonly known as:  XALATAN Place 1 drop into both eyes at bedtime.   metoprolol tartrate 25 MG tablet Commonly known as:  LOPRESSOR Take 1 tablet (25 mg total) by mouth 2 (two) times daily.   nicotine 21 mg/24hr patch Commonly known as:  NICODERM CQ - dosed in mg/24 hours Place 21 mg onto the skin. Apply patch topically onto skin daily for 4 weeks. Stop 08/14/17   nicotine 14 mg/24hr patch Commonly known as:  NICODERM CQ - dosed in mg/24 hours Place 14 mg onto the skin. Apply patch topically onto skin daily for two weeks, stop 08/29/17   SENNA S PO Take by mouth. Take 2 tablets at bedtime for constipation   terbinafine 1 % cream Commonly known as:  LAMISIL AT Apply 1 application topically 2 (two) times daily. To R foot   thiamine 100 MG tablet Take 1 tablet (100 mg total) by mouth daily.   torsemide 20 MG tablet Commonly known as:  DEMADEX Take 20 mg by mouth daily.        Vitals:   08/01/17 1033  BP: 109/62  Pulse: 63  Resp: 20  Temp: (!) 97.3 F (36.3 C)  Weight: 168 lb (76.2 kg)  Height: 6\' 4"  (1.93 m)   Body mass index is 20.45 kg/m.  Physical Exam  GENERAL APPEARANCE: Alert, conversant. No acute distress.  HEENT: Unremarkable. RESPIRATORY: Breathing is even, unlabored. Lung sounds are clear   CARDIOVASCULAR: Heart RRR no murmurs, rubs or gallops. No peripheral edema.  GASTROINTESTINAL: Abdomen is soft, non-tender, not distended w/ normal bowel sounds NEUROLOGIC: Cranial nerves 2-12 grossly intact; left-sided weakness   Labs reviewed: Basic Metabolic Panel: Recent Labs    07/07/17 2100 07/08/17 1328 07/09/17 0529 07/10/17 0603 07/11/17 0504 07/12/17 0521  NA 136 136 138 140 140 139  K 4.4 4.3 4.1 4.3 4.5 4.5  CL  104 108 108 112* 113* 111  CO2 20* 22 24 22 22  20*  GLUCOSE 101* 162* 84 72 81 78  BUN 17 18 17 14 12 13   CREATININE 1.85* 2.07* 1.92* 1.77* 1.83* 1.84*  CALCIUM 9.7 9.2 9.2 9.5 9.1 9.3  MG 2.0 1.8 1.6*  --   --   --   PHOS  --  2.6  --   --   --   --    No results found for: [facility] Liver Function Tests: Recent Labs    01/15/17 2348 07/07/17 2100 07/09/17 0529  AST 12* 18 13*  ALT 9* 6* 8*  ALKPHOS 80 95 81  BILITOT 0.7 0.4 0.7  PROT 6.5 6.5 5.5*  ALBUMIN 3.5 3.4* 2.9*   No results for input(s): LIPASE, AMYLASE in the last 8760 hours. No results for input(s): AMMONIA in the last 8760 hours. CBC: Recent Labs    08/04/16 1558  02/09/17 1117 07/07/17 2100  07/10/17 0603 07/11/17 0504 07/12/17 0521  WBC 15.8*   < > 7.6 9.8   < > 7.5 7.3 7.4  NEUTROABS 11.8*  --  4.6 5.1  --   --   --   --   HGB 11.7*   < > 13.3 12.2*   < > 11.1* 11.1* 11.3*  HCT 34.7*   < > 39.7 38.5*   < > 34.9* 34.1* 34.9*  MCV 87.8   < > 85 90.2   < > 89.9 89.7 90.2  PLT 348   < > 309 275   < > 240 236 242   < > = values in this interval not displayed.   Lipid No results for input(s): CHOL, HDL, LDLCALC, TRIG in the last 8760 hours. Cardiac Enzymes: Recent Labs    07/07/17 2100  TROPONINI <0.03   BNP: No results for input(s): BNP in the last 8760 hours. CBG: Recent Labs    07/12/17 1128  GLUCAP 98    Procedures and Imaging Studies During Stay: Ct Head Wo Contrast  Result Date: 07/07/2017 CLINICAL DATA:  Acute onset of seizure.  Initial encounter. EXAM: CT HEAD WITHOUT CONTRAST TECHNIQUE: Contiguous axial images were obtained from the base of the skull through the vertex without intravenous contrast. COMPARISON:  CT of the head performed 12/06/2016 FINDINGS: Brain: No evidence of acute infarction, hemorrhage, hydrocephalus, extra-axial collection or mass lesion/mass effect. Prominence of the ventricles and sulci reflects mild cortical volume loss. A chronic infarct noted at the right  occipital lobe, with associated encephalomalacia. Diffuse periventricular and subcortical white matter likely reflects small vessel ischemic microangiopathy. Chronic lacunar infarcts are noted at the right basal ganglia and bilateral thalami. Cerebellar atrophy is noted. The brainstem and fourth ventricle are within normal limits. No mass effect or midline shift is seen. Vascular: No hyperdense vessel or unexpected calcification. Skull: There is no evidence of fracture; visualized osseous structures are unremarkable in appearance. Sinuses/Orbits: The orbits are within normal limits. A mucus retention cyst or polyp is noted at the left maxillary sinus. The remaining paranasal sinuses and mastoid air cells are well-aerated. Other: No significant soft tissue abnormalities are seen. IMPRESSION: 1. No acute intracranial pathology seen on CT. 2. Mild cortical volume loss and diffuse small vessel ischemic microangiopathy. 3. Chronic infarct at the right occipital lobe, with associated encephalomalacia. Chronic lacunar infarcts at the right basal ganglia and bilateral thalami. 4. Mucus retention cyst or polyp at the left maxillary sinus. Electronically Signed   By: Garald Balding M.D.   On: 07/07/2017 21:49   Mr Brain Wo Contrast (neuro Protocol)  Result Date: 07/09/2017 CLINICAL DATA:  Seizure and altered mental status EXAM: MRI HEAD WITHOUT CONTRAST TECHNIQUE: Multiplanar, multiecho pulse sequences of the brain and surrounding structures were obtained without intravenous contrast. COMPARISON:  Head CT 07/07/2017 FINDINGS: The examination had to be discontinued prior to completion due to patient altered mental status and inability to cooperate with the technologist's instructions. Diffusion-weighted imaging shows no evidence of acute ischemia. There is advanced bilateral white matter hyperintensity consistent with chronic ischemic microangiopathy. Old right basal ganglia lacunar infarct. No midline shift or other mass  effect. IMPRESSION: Truncated and motion degraded examination. Within that limitation, no acute ischemia or significant mass effect. Advanced chronic microvascular disease. Electronically Signed   By: Ulyses Jarred M.D.   On: 07/09/2017 16:41   Dg Chest Port 1 View  Result Date: 07/07/2017 CLINICAL DATA:  Initial evaluation for acute seizure. EXAM: PORTABLE CHEST 1 VIEW COMPARISON:  Prior radiograph from 01/17/2017. FINDINGS: Patient is rotated to the left. Cardiomegaly is unchanged. Mediastinal silhouette unchanged allowing for patient rotation. Aortic atherosclerosis. Left-sided [place] Free transvenous pacemaker/ AICD noted. Defibrillator pad overlies left chest. Lungs hypoinflated. Mild perihilar vascular congestion without overt pulmonary edema. No focal infiltrates. No significant pleural effusion. No pneumothorax. No acute osseus abnormality.  Osteopenia noted. IMPRESSION: 1. Stable cardiomegaly with mild perihilar vascular congestion without pulmonary edema. 2. No other active cardiopulmonary disease. Electronically Signed   By: Jeannine Boga M.D.   On: 07/07/2017 21:15   Dg Swallowing Func-speech Pathology  Result Date: 07/10/2017 Objective Swallowing Evaluation: Type of Study: MBS-Modified Barium Swallow Study Patient Details Name: ALVON NYGAARD MRN: [id number] Date of Birth: July 17, 1951 Today's Date: 07/10/2017 Time: SLP Start Time (ACUTE ONLY): 1041-SLP Stop Time (ACUTE ONLY): 1107 SLP Time Calculation (min) (ACUTE ONLY): 26 min Past Medical History: Past Medical History: Diagnosis Date . Chronic kidney disease   stage 3 GFR 30-59 ml/min  . Dysphagia as late effect of cerebrovascular disease   pts wife states pt has to eat soft foods  . GERD (gastroesophageal reflux disease)  .  Glaucoma  . High cholesterol  . Hypertension  . Neuromuscular disorder ([place])   chronic inflammatory demyelinating polyneuropathy  . Stroke [facility])   2011 with residual deficit left sided weakness . Tobacco dependence  Past  Surgical History: Past Surgical History: Procedure Laterality Date . EYE SURGERY   . INGUINAL HERNIA REPAIR Right 11/23/2014  Procedure: right inguinal hernia repair with mesh;  Surgeon: Armandina Gemma, MD;  Location: [facility] ORS;  Service: General;  Laterality: Right; . INSERTION OF MESH N/A 11/23/2014  Procedure: INSERTION OF MESH;  Surgeon: Armandina Gemma, MD;  Location: [facility] ORS;  Service: General;  Laterality: N/A; . PACEMAKER IMPLANT N/A 01/16/2017  Procedure: Pacemaker Implant;  Surgeon: Will Meredith Leeds, MD;  Location: [place] [facility] LAB;  Service: Cardiovascular;  Laterality: N/A; . SHOULDER SURGERY Bilateral 1988, 3171 HPI: 67 year old history of prior CVA, CK D, dysphagia, GERD, HLD, HTN presents with new onset seizures. Treated with Versed and then loaded with Depakote in ER. ER M.D. discuss case with neurology who recommends admission to medicine for EEG in a.m. recommended initially MRI unable to obtain as patient does have history of pacemaker.  CT head unremarkable.   Subjective: pt drowsy this morning Assessment / Plan / Recommendation CHL IP CLINICAL IMPRESSIONS 07/10/2017 Clinical Impression Pt has a mild-moderate oropharyngeal dysphagia that seems relatively similar to previous MBS in 2015, although it is also impacted by pt's lethargy this morning (may be medication related per MD note). Pt has decreased oral cohesion with some lingual rocking, slow transit, and lingual residue mostly with solids. He has somewhat passive spillage into his pharynx, with solids and liquids cascading from his base of tongue down into the pyriform sinuses before his swallow is triggered. Even with large, consecutive straw sips of thin liquids in isolation, he has only occasional, trace penetration that he clears with a cued throat clear. However, once pt has oral residue from solids in his mouth and along his base of tongue, it interferes with his ability to contain thin liquids, allowing them to spill silently into his trachea  before the swallow. Recommend to continue current orders for Dsy 2 diet and thin liquids, but would avoid any mixed consistencies and would NOT use any liquid washes for clearing solids from his oral cavity. Purees and lingual sweeps could be used instead. His wife was present for study and education and verbalized her understanding as well. SLP also reinforced the importance of oral care to help reduce the risk of aspiration-related infections. SLP Visit Diagnosis Dysphagia, oropharyngeal phase (R13.12) Attention and concentration deficit following -- Frontal lobe and executive function deficit following -- Impact on safety and function Mild aspiration risk   CHL IP TREATMENT RECOMMENDATION 07/10/2017 Treatment Recommendations Therapy as outlined in treatment plan below   Prognosis 07/10/2017 Prognosis for Safe Diet Advancement Fair Barriers to Reach Goals Cognitive deficits;Time post onset Barriers/Prognosis Comment -- CHL IP DIET RECOMMENDATION 07/10/2017 SLP Diet Recommendations Dysphagia 2 (Fine chop) solids;Thin liquid Liquid Administration via Straw Medication Administration Whole meds with puree Compensations Slow rate;Small sips/bites;Minimize environmental distractions;Lingual sweep for clearance of pocketing;Clear throat intermittently Postural Changes Remain semi-upright after after feeds/meals (Comment);Seated upright at 90 degrees   CHL IP OTHER RECOMMENDATIONS 07/10/2017 Recommended Consults -- Oral Care Recommendations Oral care BID Other Recommendations Have oral suction available   CHL IP FOLLOW UP RECOMMENDATIONS 07/10/2017 Follow up Recommendations Inpatient Rehab   CHL IP FREQUENCY AND DURATION 07/10/2017 Speech Therapy Frequency (ACUTE ONLY) min 2x/week Treatment Duration 2 weeks  CHL IP ORAL PHASE 07/10/2017 Oral Phase Impaired Oral - Pudding Teaspoon -- Oral - Pudding Cup -- Oral - Honey Teaspoon -- Oral - Honey Cup -- Oral - Nectar Teaspoon -- Oral - Nectar Cup -- Oral - Nectar Straw  Weak lingual manipulation;Lingual pumping;Reduced posterior propulsion;Delayed oral transit;Decreased bolus cohesion Oral - Thin Teaspoon -- Oral - Thin Cup -- Oral - Thin Straw Weak lingual manipulation;Lingual pumping;Reduced posterior propulsion;Delayed oral transit;Decreased bolus cohesion Oral - Puree Weak lingual manipulation;Lingual pumping;Reduced posterior propulsion;Delayed oral transit;Decreased bolus cohesion Oral - Mech Soft Weak lingual manipulation;Lingual pumping;Reduced posterior propulsion;Delayed oral transit;Decreased bolus cohesion;Impaired mastication;Lingual/palatal residue Oral - Regular -- Oral - Multi-Consistency -- Oral - Pill -- Oral Phase - Comment --  CHL IP PHARYNGEAL PHASE 07/10/2017 Pharyngeal Phase Impaired Pharyngeal- Pudding Teaspoon -- Pharyngeal -- Pharyngeal- Pudding Cup -- Pharyngeal -- Pharyngeal- Honey Teaspoon -- Pharyngeal -- Pharyngeal- Honey Cup -- Pharyngeal -- Pharyngeal- Nectar Teaspoon -- Pharyngeal -- Pharyngeal- Nectar Cup -- Pharyngeal -- Pharyngeal- Nectar Straw Delayed swallow initiation-pyriform sinuses Pharyngeal -- Pharyngeal- Thin Teaspoon -- Pharyngeal -- Pharyngeal- Thin Cup -- Pharyngeal -- Pharyngeal- Thin Straw Delayed swallow initiation-pyriform sinuses;Penetration/Aspiration before swallow Pharyngeal Material enters airway, passes BELOW cords without attempt by patient to eject out (silent aspiration);Material enters airway, remains ABOVE vocal cords and not ejected out Pharyngeal- Puree Delayed swallow initiation-pyriform sinuses Pharyngeal -- Pharyngeal- Mechanical Soft Delayed swallow initiation-pyriform sinuses Pharyngeal -- Pharyngeal- Regular -- Pharyngeal -- Pharyngeal- Multi-consistency -- Pharyngeal -- Pharyngeal- Pill -- Pharyngeal -- Pharyngeal Comment --  CHL IP CERVICAL ESOPHAGEAL PHASE 07/10/2017 Cervical Esophageal Phase WFL Pudding Teaspoon -- Pudding Cup -- Honey Teaspoon -- Honey Cup -- Nectar Teaspoon -- Nectar Cup -- Nectar Straw  -- Thin Teaspoon -- Thin Cup -- Thin Straw -- Puree -- Mechanical Soft -- Regular -- Multi-consistency -- Pill -- Cervical Esophageal Comment -- CHL IP GO 11/05/2013 Functional Assessment Tool Used mbs, clinical judgement Functional Limitations Swallowing Swallow Current Status (K1601) CJ Swallow Goal Status (U9323) CJ Swallow Discharge Status (F5732) CJ Motor Speech Current Status (K0254) (None) Motor Speech Goal Status (Y7062) (None) Motor Speech Goal Status (B7628) (None) Spoken Language Comprehension Current Status (B1517) (None) Spoken Language Comprehension Goal Status (O1607) (None) Spoken Language Comprehension Discharge Status (P7106) (None) Spoken Language Expression Current Status (Y6948) (None) Spoken Language Expression Goal Status (N4627) (None) Spoken Language Expression Discharge Status (O3500) (None) Attention Current Status (X3818) (None) Attention Goal Status (E9937) (None) Attention Discharge Status (J6967) (None) Memory Current Status (E9381) (None) Memory Goal Status (O1751) (None) Memory Discharge Status (W2585) (None) Voice Current Status (I7782) (None) Voice Goal Status (U2353) (None) Voice Discharge Status (I1443) (None) Other Speech-Language Pathology Functional Limitation Current Status (X5400) (None) Other Speech-Language Pathology Functional Limitation Goal Status (Q6761) (None) Other Speech-Language Pathology Functional Limitation Discharge Status [phone]) (None) [name] 07/10/2017, 12:12 PM  [name], M.A. CCC-SLP [phone]              Assessment/Plan:   New onset seizure ([place])  History of CVA with residual deficit  CKD (chronic kidney disease) stage 3, GFR 30-59 ml/min ([name])  Essential hypertension  Status cardiac pacemaker  Bradycardia  Gastroesophageal reflux disease without esophagitis  CIDP (chronic inflammatory demyelinating polyneuropathy) ([name])  Hyperlipidemia, unspecified hyperlipidemia type  Nuclear sclerosis of both eyes  Primary  open angle glaucoma of both eyes, indeterminate stage   Patient is being discharged with the following home health services:  OT/PT/nursing  Patient is being discharged with the following durable medical equipment:  one  Patient has  been advised to f/u with their PCP in 1-2 weeks to bring them up to date on their rehab stay.  Social services at facility was responsible for arranging this appointment.  Pt was provided with a 30 day supply of prescriptions for medications and refills must be obtained from their PCP.  For controlled substances, a more limited supply may be provided adequate until PCP appointment only.  Medications have been reconciled.  Time spent greater than 30 minutes;> 50% of time with patient was spent reviewing records, labs, tests and studies, counseling and developing plan of care  Noah Delaine. Sheppard Coil, MD

## 2017-08-01 NOTE — Patient Outreach (Signed)
[facility]) Care Management  08/01/2017  Darrell [name] [birth year] [id number]   Met with patient and wife, Darrell [name] at bedside.  Reviewed [facility] packet, patient requested wife sign consent.  Reviewed referral to [facility] LCSW services.  Patient to discharge 08/02/17  Met with Darrell [name], SW at facility. Let her know that patient will have [facility] LCSW for community resources and monitor for any additional needs for [facility] RNCM or Pharmacist.  Plan to sign off. Darrell Crochet. Laymond Purser, RN, BSN, [place] [phone]) Business Cell  [phone]) Toll Free Office

## 2017-08-06 ENCOUNTER — Telehealth: Payer: Self-pay | Admitting: Family Medicine

## 2017-08-06 ENCOUNTER — Encounter: Payer: Self-pay | Admitting: *Deleted

## 2017-08-06 ENCOUNTER — Other Ambulatory Visit: Payer: Self-pay | Admitting: Surgery

## 2017-08-06 ENCOUNTER — Other Ambulatory Visit: Payer: Self-pay | Admitting: *Deleted

## 2017-08-06 NOTE — Telephone Encounter (Signed)
Patient's wife called to ask if there was a way to get the patient in sooner because he just got out of rehab on Thursday Nov. 8th and was told that he needed to be seen within a week. He has an appointment on December the 14th. Please fu

## 2017-08-06 NOTE — Patient Outreach (Addendum)
[facility]) Care Management  08/06/2017  Darrell [name] 10/10/1950 [id number]   CSW was able to make initial contact with patient's wife,  Rhonin Trott today to perform the phone assessment on patient, as well as assess and assist with social work needs and services.  CSW was scheduled to meet with patient at [facility] today (Monday, August 06, 2017 at 2:00pm), [place] where patient was residing to receive short-term rehabilitative services; however, CSW noted that patient was discharged back home on Thursday, August 02, 2017.  CSW introduced self, explained role and types of services provided through [place] [employer] ([place] [employer]).  CSW further explained to Mrs. Needle that [place] works Burgess Amor, Buyer, retail, also with [place] [employer]. CSW then explained the reason for the call, indicating that Mrs. Niemczura thought that patient would benefit from social work services and resources to assist with having a wheelchair ramp built for home use.  CSW obtained two HIPAA compliant identifiers from Mrs. Owens Shark, which included patient's name and date of birth. Mrs. Gleaves reported that they currently have a wooden ramp in place on the front of their home, but the wood is rotten and not safe for patient to travel via wheelchair.  Mrs. Tompson went on to say that the ramp does not have rails or anything for patient to hold on to, in the event that he wants to ambulate with his walker.  CSW was able to provide Mrs. Sweda with a list of community agencies and resources that may be able to offer financial assistance, with both labor and supplies.  Mrs. Rami agreed to contact the list of agencies to request assistance, then follow-up with CSW to report findings.  Mrs. Salguero was most appreciative of the call and for the list of resources provided.  CSW will make arrangements to follow-up with Mrs.  Maher within the next week, if a return call is not received in the meantime.  Mrs. Vanessen voiced understanding and was agreeable to this plan. [facility] CM Care Plan Problem One     Most Recent Value  Care Plan Problem One  Patient needs a wheelchair ramp installed for home use.  Role Documenting the Problem One  Clinical Social Worker  Care Plan for Problem One  Active  [facility] Long Term Goal   Patient will utilize the list of resources provided to him by CSW to see about having a wheelchair ramp installed on the front of his home, within the next 45 days.  [facility] Long Term Goal Start Date  08/06/17  Interventions for Problem One Long Term Goal  A list of resources has been provided to patient's wife.  [facility] CM Short Term Goal #1   Patient and wife will contact the list of agencies provided to them to see about obtaining financial assistance with wheelchair ramp installation, within the next two weeks.  [facility] CM Short Term Goal #1 Start Date  08/06/17  Interventions for Short Term Goal #1  CSW has completed a financial assessment with patient's wife to determine eligibility of program services.  [facility] CM Short Term Goal #2   Patient's wife will contact patient's [employer] Insurance coverage to see if supplies for wheelchair ramp installation are covered through insurance, within the next two weeks.  [facility] CM Short Term Goal #2 Start Date  08/06/17  Interventions for Short Term Goal #2  CSW provided patient's wife with the contact information for  [employer].     [name], BSW, MSW, LCSW  Licensed Education officer, environmental Health System  Mailing [place] [address], [place], [place] [address] Physical Address-[address], [place], [place] [address] Toll Free Main # [phone] Fax # [phone] Cell # [phone]  Office # [phone] [email or web address]

## 2017-08-06 NOTE — Telephone Encounter (Signed)
He can be scheduled with anyone who is available before then.

## 2017-08-08 ENCOUNTER — Ambulatory Visit (INDEPENDENT_AMBULATORY_CARE_PROVIDER_SITE_OTHER): Payer: Medicare PPO | Admitting: *Deleted

## 2017-08-08 DIAGNOSIS — I441 Atrioventricular block, second degree: Secondary | ICD-10-CM | POA: Diagnosis not present

## 2017-08-13 ENCOUNTER — Other Ambulatory Visit: Payer: Self-pay | Admitting: *Deleted

## 2017-08-13 NOTE — Patient Outreach (Signed)
[facility]) Care Management  08/13/2017  Darrell [name] 07/15/1951 [id number]   CSW was able to make contact with patient's wife, Darrell [name] today to follow-up regarding wheelchair ramp installation for patient to their existing front porch, through [employer].  Darrell [name] admitted that she has not had an opportunity to [employer]; therefore, CSW agreed to make the referral on patient's behalf.  A HIPAA compliant message was left for Darrell [name], Representative with [employer], to place the referral.  Darrell [name] will be out of the office until August 20, 2017; therefore, CSW agreed to follow-up with Darrell [name] on Wednesday, August 22, 2017 to ensure that a call has been received by Darrell [name] regarding the wheelchair ramp installation.  In the meantime, Darrell [name] agreed to contact various other community agencies and resources to see if they are able to offer financial assistance with supplies and labor regarding the wheelchair ramp installation.  A list of community agencies and resources has been provided to Darrell [name] for reference.   Darrell [name], BSW, MSW, LCSW  Licensed Education officer, environmental Health System  Mailing [place] [address], [place], [place] [address] Physical Address-[address], [place], [place] [address] Toll Free Main # [phone] Fax # [phone] Cell # [phone]  Office # [phone] [name]

## 2017-08-15 ENCOUNTER — Ambulatory Visit: Payer: Medicare PPO | Attending: Internal Medicine | Admitting: Physician Assistant

## 2017-08-15 ENCOUNTER — Encounter: Payer: Self-pay | Admitting: Physician Assistant

## 2017-08-15 VITALS — BP 119/75 | HR 64 | Temp 98.4°F | Resp 16 | Ht 76.0 in | Wt 167.8 lb

## 2017-08-15 DIAGNOSIS — H409 Unspecified glaucoma: Secondary | ICD-10-CM | POA: Diagnosis not present

## 2017-08-15 DIAGNOSIS — Z881 Allergy status to other antibiotic agents status: Secondary | ICD-10-CM | POA: Diagnosis not present

## 2017-08-15 DIAGNOSIS — Z8673 Personal history of transient ischemic attack (TIA), and cerebral infarction without residual deficits: Secondary | ICD-10-CM | POA: Insufficient documentation

## 2017-08-15 DIAGNOSIS — Z7982 Long term (current) use of aspirin: Secondary | ICD-10-CM | POA: Insufficient documentation

## 2017-08-15 DIAGNOSIS — R569 Unspecified convulsions: Secondary | ICD-10-CM | POA: Diagnosis present

## 2017-08-15 DIAGNOSIS — Z888 Allergy status to other drugs, medicaments and biological substances status: Secondary | ICD-10-CM | POA: Diagnosis not present

## 2017-08-15 DIAGNOSIS — N183 Chronic kidney disease, stage 3 unspecified: Secondary | ICD-10-CM

## 2017-08-15 DIAGNOSIS — Z950 Presence of cardiac pacemaker: Secondary | ICD-10-CM | POA: Diagnosis not present

## 2017-08-15 DIAGNOSIS — Z09 Encounter for follow-up examination after completed treatment for conditions other than malignant neoplasm: Secondary | ICD-10-CM | POA: Diagnosis not present

## 2017-08-15 DIAGNOSIS — K219 Gastro-esophageal reflux disease without esophagitis: Secondary | ICD-10-CM | POA: Diagnosis not present

## 2017-08-15 DIAGNOSIS — I129 Hypertensive chronic kidney disease with stage 1 through stage 4 chronic kidney disease, or unspecified chronic kidney disease: Secondary | ICD-10-CM | POA: Diagnosis not present

## 2017-08-15 DIAGNOSIS — R001 Bradycardia, unspecified: Secondary | ICD-10-CM | POA: Insufficient documentation

## 2017-08-15 DIAGNOSIS — Z79899 Other long term (current) drug therapy: Secondary | ICD-10-CM | POA: Diagnosis not present

## 2017-08-15 DIAGNOSIS — R1310 Dysphagia, unspecified: Secondary | ICD-10-CM | POA: Diagnosis not present

## 2017-08-15 DIAGNOSIS — E7800 Pure hypercholesterolemia, unspecified: Secondary | ICD-10-CM | POA: Insufficient documentation

## 2017-08-15 DIAGNOSIS — G6181 Chronic inflammatory demyelinating polyneuritis: Secondary | ICD-10-CM | POA: Diagnosis not present

## 2017-08-15 DIAGNOSIS — Z91013 Allergy to seafood: Secondary | ICD-10-CM | POA: Diagnosis not present

## 2017-08-15 DIAGNOSIS — I10 Essential (primary) hypertension: Secondary | ICD-10-CM | POA: Diagnosis not present

## 2017-08-15 MED ORDER — FOLIC ACID 1 MG PO TABS: 1.0000 mg | ORAL_TABLET | Freq: Every day | ORAL | 1 refills | Status: DC

## 2017-08-15 MED ORDER — DIVALPROEX SODIUM 500 MG PO DR TAB: 500.0000 mg | DELAYED_RELEASE_TABLET | Freq: Two times a day (BID) | ORAL | 1 refills | Status: DC

## 2017-08-15 NOTE — Progress Notes (Signed)
Follow up from rehab and post hospital admission

## 2017-08-15 NOTE — Progress Notes (Signed)
Patient ID: BROOKLYN JEFF, male   DOB: [birth year], 66 y.o.   MRN: [id number]     Darrell [name], is a 66 y.o. male  PYK:[id number]  NLZ:[id number]  DOB - [birth year]  Subjective:  Chief Complaint and HPI: Darrell [name] is a 66 y.o. male here today for a follow up visit After being hospitalized then in SNF.  Hospitalization 08/07/2017-08/12/2017 for new onset seizure in the setting of prior CVA.  Current w/up for demyelinating d/o by Neurology.  HE IS DOING WELL WITH NO NEW COMPLAINTS.  currently they do not have home health in place.  They are considering getting an aid.  They have not made a follow-up appt with Dr Lonni Fix.   He has surgery schedul;ed for removal of a mass on his L neck in December.     From D/C summary:  Discharge Diagnoses:  New onset seizure in the setting of prior CVA Essential hypertension History of CVA with residual deficit Chronic kidney disease, stage III History of bradycardia   History of present illness:  On 07/08/2017 by Dr. Dia Crawford Zettie Pho a 66 y.o.BM PMHxCVA with residual dysphagia, Neuromuscular disorder(chronic inflammatory demyelinating polyneuropathy) HTN,Symptomatic Bradycardia S/P pacemaker 12/2016, HLD, tobacco abuse, CKD stage 3  Presented by EMS after witnessed seizure,withaltered mental status(postictal) .In route the patient continued to have seizure-like activity, and received Versed, 5 mg per This seemed to slow the seizure activity, though did not cease entirely.The patient himself has a nasal trumpet in place, is actively receiving supplemental oxygen, his not responding to verbal stimuli, level 5 caveatsecondary to change in mental status. No previous history of seizure. Uses walker at home for ambulation. Per wife patient had seizure activity for ~10-15 minutes which spontaneously resolved just prior to EMS arriving on seen.states negative loss of bowel or bladder control. No new changes to medication. Does not drink. Per  [facility] note case discussed with Neurology who recommended EEG  Hospital Course:  New onset seizure in the setting of prior CVA -EEG showed evidence of mild generalized slowing of brain consistent with postictal state however no epileptiform discharges or seizures -Neurology consulted and appreciated, recommended MRI of the brain and C-spine however patient cannot complete MRI due to agitation -MRI brain showed no acute stroke -Patient was placed on Keppra 07/09/2017 however appeared to be much more drowsy and was transitioned to Depakote -Patient to have MRI C-spine as outpatient -He does follow with Dr. Corwin Levins for demyelinating disorder/CIDP  Essential hypertension -BP stable, continue amlodipine, hydralazine, metoprolol -resume torsemide and lisinopril on discharge- monitor BP and BMP closely   History of CVA with residual deficit -Continue aspirin, statin -SNF placement  Chronic kidney disease, stage III -Baselinecreatinine approximately2, currently 1.84  History of bradycardia -Status post pacemaker placement  Follow up with Dr. Corwin Levins, neurology  ED/Hospital notes reviewed and summarized above.  ROS:   Constitutional:  No f/c, No night sweats, No unexplained weight loss. EENT:  No vision changes, No blurry vision, No hearing changes. No mouth, throat, or ear problems.  Respiratory: No cough, No SOB Cardiac: No CP, no palpitations GI:  No abd pain, No N/V/D. GU: No Urinary s/sx Musculoskeletal: No joint pain Neuro: No headache, no dizziness, no motor weakness.  Skin: No rash Endocrine:  No polydipsia. No polyuria.  Psych: Denies SI/HI  No problems updated.  ALLERGIES: Allergies  Allergen Reactions  . Doxycycline Other (See Comments)    Hiccups, cough, nausea and emesis, elevated liver enzymes, elevated eosinophils, SOB concerning for early  DRESS syndrome   . Atacand Hct [Candesartan Cilexetil-Hctz] Hives  . Shellfish Allergy Hives    PAST MEDICAL  HISTORY: Past Medical History:  Diagnosis Date  . At high risk for falls 08/16/2015  . Bradycardia   . Chronic kidney disease    stage 3 GFR 30-59 ml/min   . CIDP (chronic inflammatory demyelinating polyneuropathy) ([place])   . CKD (chronic kidney disease) stage 3, GFR 30-59 ml/min ([name]) 08/16/2015  . Dysphagia as late effect of cerebrovascular disease    pts wife states pt has to eat soft foods   . Elevated liver enzymes 08/10/2016  . GERD (gastroesophageal reflux disease)   . Glaucoma   . High cholesterol   . History of CVA with residual deficit 03/25/2013  . Hypertension   . Hypertensive retinopathy of both eyes 01/16/2017  . Inguinal hernia 03/25/2013  . Liver hemangioma 08/14/2016  . Neuromuscular disorder ([place])    chronic inflammatory demyelinating polyneuropathy   . New onset seizure ([place]) 07/08/2017  . Nuclear sclerosis of both eyes 10/25/2016  . Primary open angle glaucoma of both eyes, indeterminate stage 10/25/2016  . Renal mass, right 08/14/2016  . Status cardiac pacemaker 01/29/2017   Placed for second degree heart block on 01/16/17 Medtronic Azure XT DR MRI SureScan dual-chamber pacemaker  . Stroke [facility])    2011 with residual deficit left sided weakness  . Tobacco dependence     MEDICATIONS AT HOME: Prior to Admission medications   Medication Sig Start Date End Date Taking? Authorizing Provider  acetaminophen (TYLENOL) 325 MG tablet Take 650 mg by mouth. Take 2 tablets every 6 hours as needed for fever greater than 101.   Yes [provider]  amLODipine (NORVASC) 10 MG tablet Take 1 tablet (10 mg total) by mouth every morning. 07/17/17  Yes Tresa Garter, MD  aspirin EC 81 MG tablet Take 1 tablet (81 mg total) by mouth daily. 02/20/17  Yes Funches, Josalyn, MD  atorvastatin (LIPITOR) 40 MG tablet Take 1 tablet (40 mg total) by mouth daily. 05/03/17  Yes Funches, Josalyn, MD  brimonidine (ALPHAGAN P) 0.1 % SOLN Place 1 drop into both eyes 2 (two) times daily.    Yes [provider]  divalproex (DEPAKOTE) 500 MG DR tablet Take 1 tablet (500 mg total) by mouth 2 (two) times daily. 08/15/17  Yes Sequoyah Counterman M, PA-C  dorzolamide-timolol (COSOPT) 22.3-6.8 MG/ML ophthalmic solution Place 1 drop into both eyes 2 (two) times daily.   Yes [provider]  esomeprazole (NEXIUM) 20 MG capsule Take 20 mg by mouth daily at 12 noon.   Yes [provider]  fluticasone (FLONASE) 50 MCG/ACT nasal spray Place 2 sprays into both nostrils daily. 09/12/16  Yes Funches, Josalyn, MD  hydrALAZINE (APRESOLINE) 50 MG tablet Take 1 tablet (50 mg total) by mouth 3 (three) times daily. 05/03/17  Yes Funches, Josalyn, MD  ketoconazole (NIZORAL) 2 % cream Apply 1 application topically daily as needed for irritation.   Yes [provider]  latanoprost (XALATAN) 0.005 % ophthalmic solution Place 1 drop into both eyes at bedtime.   Yes [provider]  metoprolol tartrate (LOPRESSOR) 25 MG tablet Take 1 tablet (25 mg total) by mouth 2 (two) times daily. 05/02/17 08/15/17 Yes Camnitz, Will Hassell Done, MD  Multiple Vitamins-Iron (DAILY VITAMIN FORMULA+IRON) TABS Take 1 tablet by mouth daily.   Yes [provider]  nicotine (NICODERM CQ - DOSED IN MG/24 HOURS) 14 mg/24hr patch Place 14 mg onto the skin. Apply  patch topically onto skin daily for two weeks, stop 08/29/17   Yes [provider]  Sennosides-Docusate Sodium (SENNA S PO) Take by mouth. Take 2 tablets at bedtime for constipation   Yes [provider]  terbinafine (LAMISIL AT) 1 % cream Apply 1 application topically 2 (two) times daily. To R foot 01/29/17  Yes Funches, Josalyn, MD  thiamine 100 MG tablet Take 1 tablet (100 mg total) by mouth daily. 07/12/17  Yes Mikhail, Velta Addison, DO  torsemide (DEMADEX) 20 MG tablet Take 20 mg by mouth daily.   Yes [provider]  folic acid (FOLVITE) 1 MG tablet Take 1 tablet (1 mg total) by mouth daily. 08/15/17   Argentina Donovan, PA-C  nicotine (NICODERM CQ - DOSED IN MG/24 HOURS) 21 mg/24hr patch Place 21 mg onto the skin. Apply patch topically onto skin daily for 4 weeks. Stop 08/14/17    [provider]     Objective:  EXAM:   Vitals:   08/15/17 0957  BP: 119/75  Pulse: 64  Resp: 16  Temp: 98.4 F (36.9 C)  TempSrc: Oral  SpO2: 100%  Weight: 167 lb 12.8 oz (76.1 kg)  Height: 6\' 4"  (1.93 m)    General appearance : A&OX3. NAD. Non-toxic-appearing, in a wheel chair HEENT: Atraumatic and Normocephalic.  PERRLA. EOM intact.   Neck: supple, no JVD. No cervical lymphadenopathy. No thyromegaly, mass L neck Chest/Lungs:  Breathing-non-labored, Good air entry bilaterally, breath sounds normal without rales, rhonchi, or wheezing  CVS: S1 S2 regular, no murmurs, gallops, rubs  Extremities: Bilateral Lower Ext shows no edema, both legs are warm to touch with = pulse throughout Neurology:  In a wheel chair, neuro ea=xam deferred to neurology Psych:  TP linear. J/I WNL. Speech muffled at times but coherent. Appropriate eye contact and affect.  Skin:  No Rash  Data Review Lab Results  Component Value Date   HGBA1C 5.3 10/07/2013     Assessment & Plan   1. Hospital discharge follow-up Stable.  No new problems today  2. Seizures ([place]) Make appt with Dr Docia Furl handwritten on their paperwork  - folic acid (FOLVITE) 1 MG tablet; Take 1 tablet (1 mg total) by mouth daily.  Dispense: 90 tablet; Refill: 1 - divalproex (DEPAKOTE) 500 MG DR tablet; Take 1 tablet (500 mg total) by mouth 2 (two) times daily.  Dispense: 60 tablet; Refill: 1  3. Essential hypertension Controlled-continue current regimen - CBC with Differential/Platelet  4. CKD (chronic kidney disease) stage 3, GFR 30-59 ml/min ([name]) - Comprehensive metabolic panel - CBC with Differential/Platelet  Patient have been counseled extensively about nutrition and exercise  Return in about 1 month (around 09/14/2017)  for assign new PCP; follow up new onset seizures, htn, CKD.  The patient was given clear instructions to go to ER or return to medical center if symptoms don't improve, worsen or new problems develop. The patient verbalized understanding. The patient was told to call to get lab results if they haven't heard anything in the next week.     Freeman Caldron, PA-C [facility] [place], [place]   08/15/2017, 10:21 AM

## 2017-08-16 LAB — COMPREHENSIVE METABOLIC PANEL
ALK PHOS: 74 IU/L (ref 39–117)
ALT: 8 IU/L (ref 0–44)
AST: 13 IU/L (ref 0–40)
Albumin/Globulin Ratio: 1.3 (ref 1.2–2.2)
Albumin: 3.9 g/dL (ref 3.6–4.8)
BILIRUBIN TOTAL: 0.2 mg/dL (ref 0.0–1.2)
BUN/Creatinine Ratio: 19 (ref 10–24)
BUN: 41 mg/dL — AB (ref 8–27)
CHLORIDE: 104 mmol/L (ref 96–106)
CO2: 25 mmol/L (ref 20–29)
Calcium: 10 mg/dL (ref 8.6–10.2)
Creatinine, Ser: 2.18 mg/dL — ABNORMAL HIGH (ref 0.76–1.27)
GFR calc Af Amer: 35 mL/min/{1.73_m2} — ABNORMAL LOW (ref >59)
GFR calc non Af Amer: 31 mL/min/{1.73_m2} — ABNORMAL LOW (ref >59)
GLUCOSE: 106 mg/dL — AB (ref 65–99)
Globulin, Total: 2.9 g/dL (ref 1.5–4.5)
Potassium: 5.2 mmol/L (ref 3.5–5.2)
Sodium: 140 mmol/L (ref 134–144)
Total Protein: 6.8 g/dL (ref 6.0–8.5)

## 2017-08-16 LAB — CBC WITH DIFFERENTIAL/PLATELET
BASOS ABS: 0 10*3/uL (ref 0.0–0.2)
Basos: 0 %
EOS (ABSOLUTE): 0.6 10*3/uL — ABNORMAL HIGH (ref 0.0–0.4)
Eos: 6 %
HEMOGLOBIN: 11.4 g/dL — AB (ref 13.0–17.7)
Hematocrit: 34.5 % — ABNORMAL LOW (ref 37.5–51.0)
IMMATURE GRANS (ABS): 0 10*3/uL (ref 0.0–0.1)
Immature Granulocytes: 0 %
LYMPHS ABS: 2.3 10*3/uL (ref 0.7–3.1)
LYMPHS: 25 %
MCH: 29.8 pg (ref 26.6–33.0)
MCHC: 33 g/dL (ref 31.5–35.7)
MCV: 90 fL (ref 79–97)
MONOCYTES: 9 %
Monocytes Absolute: 0.8 10*3/uL (ref 0.1–0.9)
Neutrophils Absolute: 5.4 10*3/uL (ref 1.4–7.0)
Neutrophils: 60 %
Platelets: 215 10*3/uL (ref 150–379)
RBC: 3.82 x10E6/uL — AB (ref 4.14–5.80)
RDW: 14.3 % (ref 12.3–15.4)
WBC: 9.1 10*3/uL (ref 3.4–10.8)

## 2017-08-22 ENCOUNTER — Other Ambulatory Visit: Payer: Self-pay | Admitting: *Deleted

## 2017-08-22 NOTE — Pre-Procedure Instructions (Signed)
Darrell [name]  08/22/2017      [place], [address] [name] [address] Phone: [phone] Fax: [phone]    Your procedure is scheduled on December 12  Report to [place] at [place].M.  Call this number if you have problems the morning of surgery:  [phone]   Remember:  Do not eat food or drink liquids after midnight.  Continue all medications as directed by your physician except follow these medication instructions before surgery below   Take these medicines the morning of surgery with A SIP OF WATER  amLODipine (NORVASC)  Eye drops divalproex (DEPAKOTE) esomeprazole (NEXIUM)  fluticasone (FLONASE) hydrALAZINE (APRESOLINE) metoprolol tartrate (LOPRESSOR  7 days prior to surgery STOP taking any Aspirin(unless otherwise instructed by your surgeon), Aleve, Naproxen, Ibuprofen, Motrin, Advil, Goody's, BC's, all herbal medications, fish oil, and all vitamins  Follow your doctors instructions regarding your Aspirin.  If no instructions were given by your doctor, then you will need to call the prescribing office office to get instructions.      Do not wear jewelry  Do not wear lotions, powders, or cologne, or deoderant.  Men may shave face and neck.  Do not bring valuables to the hospital.  [facility] is not responsible for any belongings or valuables.  Contacts, dentures or bridgework may not be worn into surgery.  Leave your suitcase in the car.  After surgery it may be brought to your room.  For patients admitted to the hospital, discharge time will be determined by your treatment team.  Patients discharged the day of surgery will not be allowed to drive home.    Special instructions:   [place]- Preparing For Surgery  Before surgery, you can play an important role. Because skin is not sterile, your skin needs to be as free of germs as possible. You can reduce the  number of germs on your skin by washing with CHG (chlorahexidine gluconate) Soap before surgery.  CHG is an antiseptic cleaner which kills germs and bonds with the skin to continue killing germs even after washing.  Please do not use if you have an allergy to CHG or antibacterial soaps. If your skin becomes reddened/irritated stop using the CHG.  Do not shave (including legs and underarms) for at least 48 hours prior to first CHG shower. It is OK to shave your face.  Please follow these instructions carefully.   1. Shower the NIGHT BEFORE SURGERY and the MORNING OF SURGERY with CHG.   2. If you chose to wash your hair, wash your hair first as usual with your normal shampoo.  3. After you shampoo, rinse your hair and body thoroughly to remove the shampoo.  4. Use CHG as you would any other liquid soap. You can apply CHG directly to the skin and wash gently with a scrungie or a clean washcloth.   5. Apply the CHG Soap to your body ONLY FROM THE NECK DOWN.  Do not use on open wounds or open sores. Avoid contact with your eyes, ears, mouth and genitals (private parts). Wash Face and genitals (private parts)  with your normal soap.  6. Wash thoroughly, paying special attention to the area where your surgery will be performed.  7. Thoroughly rinse your body with warm water from the neck down.  8. DO NOT shower/wash with your normal soap after using and rinsing off the CHG Soap.  9. Pat yourself dry with a CLEAN TOWEL.  10. Wear CLEAN PAJAMAS to bed the night before surgery, wear comfortable clothes the morning of surgery  11. Place CLEAN SHEETS on your bed the night of your first shower and DO NOT SLEEP WITH PETS.    Day of Surgery: Do not apply any deodorants/lotions. Please wear clean clothes to the hospital/surgery center.      Please read over the following fact sheets that you were given.

## 2017-08-22 NOTE — Patient Outreach (Signed)
[facility]) Care Management  08/22/2017  HORRIS SPEROS [birth year] [id number]   CSW was able to make contact with patient's wife, Wilmont Olund today to follow-up regarding wheelchair ramp installation for patient.  Mrs. Wix reported that she received a return call from [employer], Representative with [employer], indicating that they would not be able to assist with wheelchair ramp installation, as patient and Mrs. Blann do not currently own their own home.  CSW inquired as to whether or not patient and Mrs. Virden have gotten approval from their landlord to install a wheelchair ramp.  Mrs. Saintjean reported that she will make arrangements to talk with their landlord today to obtain their permission.  In the meantime, CSW agreed to contact the following agencies regarding wheelchair ramp installation: [facility] - # [phone] Independent Living - # [phone] [place] - # [phone] Mrs. Rash was also encouraged to do the same.  CSW agreed to follow-up with patient and Mrs. Vanwey in two weeks to report findings.  Mrs. Nuno voiced understanding and was agreeable to this plan. [facility] CM Care Plan Problem One     Most Recent Value  Care Plan Problem One  Patient needs a wheelchair ramp installed for home use.  Role Documenting the Problem One  Clinical Social Worker  Care Plan for Problem One  Active  [facility] Long Term Goal   Patient will utilize the list of resources provided to him by CSW to see about having a wheelchair ramp installed on the front of his home, within the next 45 days.  [facility] Long Term Goal Start Date  08/06/17  Interventions for Problem One Long Term Goal  A list of resources has been provided to patient's wife.  [facility] CM Short Term Goal #1   Patient and wife will contact the list of agencies provided to them to see about obtaining financial assistance with wheelchair ramp installation, within the next two  weeks.  [facility] CM Short Term Goal #1 Start Date  08/06/17  [facility] CM Short Term Goal #1 Met Date  08/13/17  Interventions for Short Term Goal #1  CSW has completed a financial assessment with patient's wife to determine eligibility of program services.  [facility] CM Short Term Goal #2   Patient's wife will contact patient's [employer] Insurance coverage to see if supplies for wheelchair ramp installation are covered through insurance, within the next two weeks.  [facility] CM Short Term Goal #2 Start Date  08/06/17  [facility] CM Short Term Goal #2 Met Date  08/22/17  Interventions for Short Term Goal #2  CSW provided patient's wife with the contact information for [facility].     [name], BSW, MSW, LCSW  Licensed Education officer, environmental Health System  Mailing [place] [address], [place], [place] [address] Physical Address-[address], [place], [place] [address] Toll Free Main # [phone] Fax # [phone] Cell # [phone]  Office # [phone] [email or web address]

## 2017-08-23 ENCOUNTER — Other Ambulatory Visit: Payer: Self-pay

## 2017-08-23 ENCOUNTER — Encounter (HOSPITAL_COMMUNITY)
Admission: RE | Admit: 2017-08-23 | Discharge: 2017-08-23 | Disposition: A | Discharge status: Home / Self Care | Payer: Medicare PPO | Source: Ambulatory Visit | Attending: Surgery | Admitting: Surgery

## 2017-08-23 ENCOUNTER — Encounter (HOSPITAL_COMMUNITY): Payer: Self-pay

## 2017-08-23 DIAGNOSIS — Z950 Presence of cardiac pacemaker: Secondary | ICD-10-CM | POA: Insufficient documentation

## 2017-08-23 DIAGNOSIS — H409 Unspecified glaucoma: Secondary | ICD-10-CM | POA: Insufficient documentation

## 2017-08-23 DIAGNOSIS — Z7982 Long term (current) use of aspirin: Secondary | ICD-10-CM | POA: Insufficient documentation

## 2017-08-23 DIAGNOSIS — I459 Conduction disorder, unspecified: Secondary | ICD-10-CM | POA: Diagnosis not present

## 2017-08-23 DIAGNOSIS — R221 Localized swelling, mass and lump, neck: Secondary | ICD-10-CM | POA: Insufficient documentation

## 2017-08-23 DIAGNOSIS — Z79899 Other long term (current) drug therapy: Secondary | ICD-10-CM | POA: Diagnosis not present

## 2017-08-23 DIAGNOSIS — Z01812 Encounter for preprocedural laboratory examination: Secondary | ICD-10-CM | POA: Diagnosis not present

## 2017-08-23 DIAGNOSIS — G6181 Chronic inflammatory demyelinating polyneuritis: Secondary | ICD-10-CM | POA: Insufficient documentation

## 2017-08-23 DIAGNOSIS — Z01818 Encounter for other preprocedural examination: Secondary | ICD-10-CM | POA: Insufficient documentation

## 2017-08-23 DIAGNOSIS — F17200 Nicotine dependence, unspecified, uncomplicated: Secondary | ICD-10-CM | POA: Insufficient documentation

## 2017-08-23 DIAGNOSIS — I129 Hypertensive chronic kidney disease with stage 1 through stage 4 chronic kidney disease, or unspecified chronic kidney disease: Secondary | ICD-10-CM | POA: Diagnosis not present

## 2017-08-23 DIAGNOSIS — E785 Hyperlipidemia, unspecified: Secondary | ICD-10-CM | POA: Diagnosis not present

## 2017-08-23 DIAGNOSIS — N183 Chronic kidney disease, stage 3 (moderate): Secondary | ICD-10-CM | POA: Diagnosis not present

## 2017-08-23 DIAGNOSIS — R569 Unspecified convulsions: Secondary | ICD-10-CM | POA: Diagnosis not present

## 2017-08-23 HISTORY — DX: Presence of cardiac pacemaker: Z95.0

## 2017-08-23 HISTORY — DX: Unspecified osteoarthritis, unspecified site: M19.90

## 2017-08-23 LAB — BASIC METABOLIC PANEL
Anion gap: 7 (ref 5–15)
BUN: 34 mg/dL — ABNORMAL HIGH (ref 6–20)
CALCIUM: 10.1 mg/dL (ref 8.9–10.3)
CO2: 24 mmol/L (ref 22–32)
CREATININE: 2.66 mg/dL — AB (ref 0.61–1.24)
Chloride: 107 mmol/L (ref 101–111)
GFR calc non Af Amer: 24 mL/min — ABNORMAL LOW (ref >60)
GFR, EST AFRICAN AMERICAN: 27 mL/min — AB (ref >60)
GLUCOSE: 108 mg/dL — AB (ref 65–99)
Potassium: 5.4 mmol/L — ABNORMAL HIGH (ref 3.5–5.1)
Sodium: 138 mmol/L (ref 135–145)

## 2017-08-23 LAB — CBC
HEMATOCRIT: 38.6 % — AB (ref 39.0–52.0)
Hemoglobin: 12.3 g/dL — ABNORMAL LOW (ref 13.0–17.0)
MCH: 29.4 pg (ref 26.0–34.0)
MCHC: 31.9 g/dL (ref 30.0–36.0)
MCV: 92.3 fL (ref 78.0–100.0)
Platelets: 175 10*3/uL (ref 150–400)
RBC: 4.18 MIL/uL — ABNORMAL LOW (ref 4.22–5.81)
RDW: 14.2 % (ref 11.5–15.5)
WBC: 8.5 10*3/uL (ref 4.0–10.5)

## 2017-08-23 NOTE — Progress Notes (Addendum)
PCP is Freeman Caldron, LOV  07/2017 (she is replacing Dr. Adrian [place].) Cardio is Dr. Elliot Cousin from [place] 12/2016.  He also placed the medtronic pacer. Nephrologist is Dr. Joelyn Oms per the wife. Back in October, he had seizure and is currently on depakote.  No recurrence since. I have sent pacer form to [facility], and have asked for cardiac clearance note from [facility] (was sent to them) [employer] rep is Olivia Canter  [phone] (form inside and she is aware)4:22pm

## 2017-08-23 NOTE — Progress Notes (Signed)
   08/23/17 1240  OBSTRUCTIVE SLEEP APNEA  Have you ever been diagnosed with sleep apnea through a sleep study? No (earlier scoring was 5, and results sent to PCP - maybe getting followed up on)  Do you snore loudly (loud enough to be heard through closed doors)?  1  Do you often feel tired, fatigued, or sleepy during the daytime (such as falling asleep during driving or talking to someone)? 1  Has anyone observed you stop breathing during your sleep? 0  Do you have, or are you being treated for high blood pressure? 1  BMI more than 35 kg/m2? 0  Age > 50 (1-yes) 1  Neck circumference greater than:Male 16 inches or larger, Male 17inches or larger? 0  Male Gender (Yes=1) 1  Obstructive Sleep Apnea Score 5  Score 5 or greater  Results sent to PCP

## 2017-08-24 NOTE — Progress Notes (Addendum)
Anesthesia Chart Review:  Pt is a 66 year old male scheduled for excision of L neck mass on 08/29/2017 with Coralie Keens, MD  - Primary care at [facility]. Last office visit 08/15/17 with Freeman Caldron, [place] cardiologist is Allegra Lai, MD who cleared patient for surgery at intermediate risk.  - Neurologist is Andrey Spearman, MD - Nephrologist is Pearson Grippe, MD. Last office visit 01/22/17  PMH includes:  Bradycardia, secondary heart block, pacemaker (Medtronic, implanted 01/16/17), HTN, hyperlipidemia, CKD (stage III), glaucoma, chronic inflammatory demyelinating polyneuropathy, new onset seizure (06/2017),  Current smoker. BMI 20.  S/p R inguinal hernia repair 11/23/14  - Hospitalized 10/13-18/18 for new onset seizure  - Hospitalized 4/23-25/18 for 2:1 AV block, pacemaker impanted   Medications include: Amlodipine, ASA 81 mg, folic acid, Lipitor, Depakote, Nexium, hydralazine, lisinopril, metoprolol, thiamine, Demadex  BP 100/72   Pulse (!) 58   Temp 36.7 C   Resp 18   Ht 6\' 4"  (1.93 m)   Wt 166 lb 1.6 oz (75.3 kg)   SpO2 100%   BMI 20.22 kg/m   Preoperative labs reviewed.   - Cr 2.66, BUN 34- Nephrology records indicate pt's baseline Cr ~1.9. - I reached out to Dr. Joelyn Oms about elevated Cr. I spoke with [place] who spoke with Dr. Joelyn Oms.  Will recheck renal function day of surgery and pt is to avoid NSAIDs and nephrotoxic antibiotics per Gwenyth Ober.   - Dr. Ninfa Linden has reviewed lab results.   CXR 07/07/17:  1. Stable cardiomegaly with mild perihilar vascular congestion without pulmonary edema. 2. No other active cardiopulmonary disease.  EKG 07/08/17: Sinus rhythm. RBBB. Nonspecific ST abnormality  If labs acceptable day of surgery, I anticipate pt can proceed as scheduled.   Willeen Cass, FNP-BC [facility]/Anesthesiology Phone: [phone] 08/28/2017 3:17 PM

## 2017-08-27 ENCOUNTER — Encounter: Payer: Self-pay | Admitting: Cardiology

## 2017-08-27 MED FILL — hydrALAZINE HCL 50 MG TABS: 50 | 90 days supply | Qty: 270 | Fill #0

## 2017-08-27 NOTE — Progress Notes (Signed)
Remote pacemaker transmission.   

## 2017-08-28 NOTE — H&P (Signed)
  Darrell [name]  Location: [facility] Patient #: [id number] DOB: [birth year] Married / Language: English / Race: Black or African American Male   History of Present Illness  The patient is a 66 year old male who presents with a complaint of Mass. This is a pleasant woman referred by Dr. Boykin Nearing for evaluation of a left lateral neck mass. The patient is accompanied by his wife. He reports that the mass is been present for approximately 3 years. It is getting larger. There is some questionable discomfort. He has a significant medical history of having had a CVA and also has a pacemaker for heart block and has hypertension.   Allergies  Atacand *ANTIHYPERTENSIVES*   Medication History  AmLODIPine Besylate (10MG Tablet, Oral) Active. Atorvastatin Calcium (40MG Tablet, Oral) Active. HydrALAZINE HCl (50MG Tablet, Oral) Active. Lisinopril (40MG Tablet, Oral) Active. Metoprolol Tartrate (25MG Tablet, Oral) Active. Aspirin EC (81MG Tablet DR, Oral) Active. Dorzolamide HCl-Timolol Mal (22.3-6.8MG/ML Solution, Ophthalmic) Active. Torsemide (20MG Tablet, Oral) Active. Brimonidine Tartrate (0.2% Solution, Ophthalmic) Active. Ketoconazole (2% Cream, External) Active. Latanoprost (0.005% Solution, Ophthalmic) Active. Multivitamins/Minerals (Oral) Active. Medications Reconciled  Vitals   Weight: 175.25 lb Height: 75in Body Surface Area: 2.07 m Body Mass Index: 21.9 kg/m  Temp.: 98.55F(Oral)  Pulse: 60 (Regular)  BP: 114/82 (Sitting, Left Arm, Standard)   Physical Exam  The physical exam findings are as follows: Note:On examination, he is wheelchair-bound. He has minimal movement of his right side. He is conversant and awake and alert There is a 2 cm, superficial nodule on his left lateral neck. There are no skin changes. There are no other neck masses or adenopathy Lungs clear CV RRR Abdomen soft, Non tender    Assessment & Plan   MASS OF LATERAL NECK (R22.1)  Impression: I suspect this is a sebaceous cyst although I cannot rule out a lipoma or dermoid tumor. This seems quite firm to be a lipoma. They are requesting excision because it is getting larger and causing discomfort. I would also recommend this for histologic evaluation to rule out a malignancy or to prevent any infection if it is sebaceous cyst. I am worried that any infection could inadvertently seed the pacemaker. I have discussed this with him in detail. They will need cardiac clearance for surgery. I would plan to do this under MAC anesthesia and local as an outpatient

## 2017-08-29 ENCOUNTER — Ambulatory Visit (HOSPITAL_COMMUNITY)
Admission: RE | Admit: 2017-08-29 | Discharge: 2017-08-29 | Disposition: A | Discharge status: Home / Self Care | Payer: Medicare PPO | Source: Ambulatory Visit | Attending: Surgery | Admitting: Surgery

## 2017-08-29 ENCOUNTER — Encounter (HOSPITAL_COMMUNITY): Payer: Self-pay | Admitting: Certified Registered"

## 2017-08-29 ENCOUNTER — Other Ambulatory Visit: Payer: Self-pay

## 2017-08-29 ENCOUNTER — Ambulatory Visit (HOSPITAL_COMMUNITY): Payer: Medicare PPO | Admitting: Emergency Medicine

## 2017-08-29 ENCOUNTER — Encounter (HOSPITAL_COMMUNITY)
Admission: RE | Disposition: A | Discharge status: Home / Self Care | Payer: Self-pay | Source: Ambulatory Visit | Attending: Surgery

## 2017-08-29 DIAGNOSIS — F17200 Nicotine dependence, unspecified, uncomplicated: Secondary | ICD-10-CM | POA: Insufficient documentation

## 2017-08-29 DIAGNOSIS — R221 Localized swelling, mass and lump, neck: Secondary | ICD-10-CM | POA: Diagnosis present

## 2017-08-29 DIAGNOSIS — N189 Chronic kidney disease, unspecified: Secondary | ICD-10-CM | POA: Diagnosis not present

## 2017-08-29 DIAGNOSIS — Z79899 Other long term (current) drug therapy: Secondary | ICD-10-CM | POA: Insufficient documentation

## 2017-08-29 DIAGNOSIS — Z993 Dependence on wheelchair: Secondary | ICD-10-CM | POA: Insufficient documentation

## 2017-08-29 DIAGNOSIS — L723 Sebaceous cyst: Secondary | ICD-10-CM | POA: Insufficient documentation

## 2017-08-29 DIAGNOSIS — Z888 Allergy status to other drugs, medicaments and biological substances status: Secondary | ICD-10-CM | POA: Diagnosis not present

## 2017-08-29 DIAGNOSIS — I129 Hypertensive chronic kidney disease with stage 1 through stage 4 chronic kidney disease, or unspecified chronic kidney disease: Secondary | ICD-10-CM | POA: Insufficient documentation

## 2017-08-29 DIAGNOSIS — R569 Unspecified convulsions: Secondary | ICD-10-CM | POA: Insufficient documentation

## 2017-08-29 DIAGNOSIS — Z8673 Personal history of transient ischemic attack (TIA), and cerebral infarction without residual deficits: Secondary | ICD-10-CM | POA: Insufficient documentation

## 2017-08-29 DIAGNOSIS — Z881 Allergy status to other antibiotic agents status: Secondary | ICD-10-CM | POA: Diagnosis not present

## 2017-08-29 DIAGNOSIS — Z91013 Allergy to seafood: Secondary | ICD-10-CM | POA: Diagnosis not present

## 2017-08-29 DIAGNOSIS — Z950 Presence of cardiac pacemaker: Secondary | ICD-10-CM | POA: Insufficient documentation

## 2017-08-29 DIAGNOSIS — Z7982 Long term (current) use of aspirin: Secondary | ICD-10-CM | POA: Insufficient documentation

## 2017-08-29 DIAGNOSIS — G6181 Chronic inflammatory demyelinating polyneuritis: Secondary | ICD-10-CM | POA: Diagnosis not present

## 2017-08-29 HISTORY — PX: MASS EXCISION: SHX2000

## 2017-08-29 LAB — BASIC METABOLIC PANEL
ANION GAP: 9 (ref 5–15)
BUN: 29 mg/dL — ABNORMAL HIGH (ref 6–20)
CALCIUM: 9.8 mg/dL (ref 8.9–10.3)
CO2: 24 mmol/L (ref 22–32)
Chloride: 104 mmol/L (ref 101–111)
Creatinine, Ser: 2.48 mg/dL — ABNORMAL HIGH (ref 0.61–1.24)
GFR calc Af Amer: 30 mL/min — ABNORMAL LOW (ref >60)
GFR calc non Af Amer: 26 mL/min — ABNORMAL LOW (ref >60)
GLUCOSE: 89 mg/dL (ref 65–99)
Potassium: 5.2 mmol/L — ABNORMAL HIGH (ref 3.5–5.1)
Sodium: 137 mmol/L (ref 135–145)

## 2017-08-29 SURGERY — EXCISION MASS
Anesthesia: Monitor Anesthesia Care | Site: Neck | Laterality: Left

## 2017-08-29 MED ORDER — LIDOCAINE HCL (PF) 1 % IJ SOLN
INTRAMUSCULAR | Status: DC | PRN
  Administered 2017-08-29: 3 mL

## 2017-08-29 MED ORDER — LIDOCAINE HCL (PF) 1 % IJ SOLN
INTRAMUSCULAR | Status: AC
  Filled 2017-08-29: qty 30

## 2017-08-29 MED ORDER — MIDAZOLAM HCL 2 MG/2ML IJ SOLN
INTRAMUSCULAR | Status: AC
  Filled 2017-08-29: qty 2

## 2017-08-29 MED ORDER — FENTANYL CITRATE (PF) 250 MCG/5ML IJ SOLN
INTRAMUSCULAR | Status: AC
  Filled 2017-08-29: qty 5

## 2017-08-29 MED ORDER — PROPOFOL 500 MG/50ML IV EMUL
INTRAVENOUS | Status: DC | PRN
  Administered 2017-08-29: 100 ug/kg/min via INTRAVENOUS

## 2017-08-29 MED ORDER — DEXAMETHASONE SODIUM PHOSPHATE 10 MG/ML IJ SOLN
INTRAMUSCULAR | Status: DC | PRN
  Administered 2017-08-29: 5 mg via INTRAVENOUS

## 2017-08-29 MED ORDER — PROPOFOL 10 MG/ML IV BOLUS
INTRAVENOUS | Status: AC
  Filled 2017-08-29: qty 20

## 2017-08-29 MED ORDER — BUPIVACAINE-EPINEPHRINE 0.25% -1:200000 IJ SOLN
INTRAMUSCULAR | Status: DC | PRN
  Administered 2017-08-29: 6 mL

## 2017-08-29 MED ORDER — TRAMADOL HCL 50 MG PO TABS: 50.0000 mg | ORAL_TABLET | Freq: Four times a day (QID) | ORAL | 0 refills | Status: DC | PRN

## 2017-08-29 MED ORDER — PROPOFOL 10 MG/ML IV BOLUS
INTRAVENOUS | Status: DC | PRN
  Administered 2017-08-29 (×2): 10 mg via INTRAVENOUS
  Administered 2017-08-29: 20 mg via INTRAVENOUS

## 2017-08-29 MED ORDER — SODIUM CHLORIDE 0.9 % IV SOLN
INTRAVENOUS | Status: DC | PRN
  Administered 2017-08-29: 08:00:00 via INTRAVENOUS

## 2017-08-29 MED ORDER — CEFAZOLIN SODIUM-DEXTROSE 2-4 GM/100ML-% IV SOLN
2.0000 g | INTRAVENOUS | Status: AC
  Administered 2017-08-29: 2 g via INTRAVENOUS
  Filled 2017-08-29: qty 100

## 2017-08-29 MED ORDER — BUPIVACAINE-EPINEPHRINE (PF) 0.25% -1:200000 IJ SOLN
INTRAMUSCULAR | Status: AC
  Filled 2017-08-29: qty 30

## 2017-08-29 MED ORDER — BUPIVACAINE HCL (PF) 0.25 % IJ SOLN
INTRAMUSCULAR | Status: AC
  Filled 2017-08-29: qty 30

## 2017-08-29 MED ORDER — 0.9 % SODIUM CHLORIDE (POUR BTL) OPTIME
TOPICAL | Status: DC | PRN
  Administered 2017-08-29: 1000 mL

## 2017-08-29 MED ORDER — ONDANSETRON HCL 4 MG/2ML IJ SOLN: 4.0000 mg | Freq: Once | INTRAMUSCULAR | Status: DC | PRN

## 2017-08-29 MED ORDER — ONDANSETRON HCL 4 MG/2ML IJ SOLN
INTRAMUSCULAR | Status: DC | PRN
  Administered 2017-08-29: 4 mg via INTRAVENOUS

## 2017-08-29 MED ORDER — CHLORHEXIDINE GLUCONATE CLOTH 2 % EX PADS: 6.0000 | MEDICATED_PAD | Freq: Once | CUTANEOUS | Status: DC

## 2017-08-29 MED ORDER — LIDOCAINE 2% (20 MG/ML) 5 ML SYRINGE
INTRAMUSCULAR | Status: AC
  Filled 2017-08-29: qty 5

## 2017-08-29 MED ORDER — FENTANYL CITRATE (PF) 100 MCG/2ML IJ SOLN: 25.0000 ug | INTRAMUSCULAR | Status: DC | PRN

## 2017-08-29 SURGICAL SUPPLY — 40 items
ADH SKN CLS APL DERMABOND .7 (GAUZE/BANDAGES/DRESSINGS) ×1
BLADE SURG 10 STRL SS (BLADE) ×2 IMPLANT
BLADE SURG 15 STRL LF DISP TIS (BLADE) ×1 IMPLANT
BLADE SURG 15 STRL SS (BLADE) ×2
CANISTER SUCT 3000ML PPV (MISCELLANEOUS) IMPLANT
COVER SURGICAL LIGHT HANDLE (MISCELLANEOUS) ×2 IMPLANT
DERMABOND ADVANCED (GAUZE/BANDAGES/DRESSINGS) ×1
DERMABOND ADVANCED .7 DNX12 (GAUZE/BANDAGES/DRESSINGS) ×1 IMPLANT
DRAPE LAPAROSCOPIC ABDOMINAL (DRAPES) IMPLANT
DRAPE LAPAROTOMY 100X72 PEDS (DRAPES) ×1 IMPLANT
DRAPE UTILITY XL STRL (DRAPES) ×2 IMPLANT
DRSG TEGADERM 4X4.75 (GAUZE/BANDAGES/DRESSINGS) ×2 IMPLANT
ELECT CAUTERY BLADE 6.4 (BLADE) ×2 IMPLANT
ELECT REM PT RETURN 9FT ADLT (ELECTROSURGICAL) ×2
ELECTRODE REM PT RTRN 9FT ADLT (ELECTROSURGICAL) ×1 IMPLANT
GAUZE SPONGE 4X4 12PLY STRL (GAUZE/BANDAGES/DRESSINGS) ×2 IMPLANT
GLOVE SURG SIGNA 7.5 PF LTX (GLOVE) ×2 IMPLANT
GOWN STRL REUS W/ TWL LRG LVL3 (GOWN DISPOSABLE) ×1 IMPLANT
GOWN STRL REUS W/ TWL XL LVL3 (GOWN DISPOSABLE) ×1 IMPLANT
GOWN STRL REUS W/TWL LRG LVL3 (GOWN DISPOSABLE) ×2
GOWN STRL REUS W/TWL XL LVL3 (GOWN DISPOSABLE) ×2
KIT BASIN OR (CUSTOM PROCEDURE TRAY) ×2 IMPLANT
KIT ROOM TURNOVER OR (KITS) ×2 IMPLANT
NDL HYPO 25GX1X1/2 BEV (NEEDLE) ×1 IMPLANT
NEEDLE HYPO 25GX1X1/2 BEV (NEEDLE) ×4 IMPLANT
NS IRRIG 1000ML POUR BTL (IV SOLUTION) ×2 IMPLANT
PACK SURGICAL SETUP 50X90 (CUSTOM PROCEDURE TRAY) ×2 IMPLANT
PAD ARMBOARD 7.5X6 YLW CONV (MISCELLANEOUS) ×2 IMPLANT
PENCIL BUTTON HOLSTER BLD 10FT (ELECTRODE) ×2 IMPLANT
SPECIMEN JAR SMALL (MISCELLANEOUS) ×2 IMPLANT
SPONGE LAP 18X18 X RAY DECT (DISPOSABLE) ×2 IMPLANT
SUT MNCRL AB 4-0 PS2 18 (SUTURE) ×2 IMPLANT
SUT VIC AB 3-0 SH 27 (SUTURE) ×2
SUT VIC AB 3-0 SH 27XBRD (SUTURE) ×1 IMPLANT
SYR BULB 3OZ (MISCELLANEOUS) ×2 IMPLANT
SYR CONTROL 10ML LL (SYRINGE) ×3 IMPLANT
TOWEL OR 17X24 6PK STRL BLUE (TOWEL DISPOSABLE) ×2 IMPLANT
TOWEL OR 17X26 10 PK STRL BLUE (TOWEL DISPOSABLE) ×2 IMPLANT
TUBE CONNECTING 12X1/4 (SUCTIONS) ×1 IMPLANT
YANKAUER SUCT BULB TIP NO VENT (SUCTIONS) ×1 IMPLANT

## 2017-08-29 NOTE — Transfer of Care (Signed)
Immediate Anesthesia Transfer of Care Note  Patient: Darrell [name]  Procedure(s) Performed: EXCISION OF LEFT NECK MASS (Left Neck)  Patient Location: PACU  Anesthesia Type:MAC  Level of Consciousness: awake, oriented and patient cooperative  Airway & Oxygen Therapy: Patient Spontanous Breathing and Patient connected to nasal cannula oxygen  Post-op Assessment: Report given to RN and Post -op Vital signs reviewed and stable  Post vital signs: Reviewed and stable  Last Vitals:  Vitals:   08/29/17 0638  BP: 106/74  Pulse: 62  Resp: 18  Temp: 36.6 C  SpO2: 100%    Last Pain:  Vitals:   08/29/17 0638  TempSrc: Oral      Patients Stated Pain Goal: 7 (19/37/90 2409)  Complications: No apparent anesthesia complications

## 2017-08-29 NOTE — Anesthesia Postprocedure Evaluation (Signed)
Anesthesia Post Note  Patient: Darrell [name]  Procedure(s) Performed: EXCISION OF LEFT NECK MASS (Left Neck)     Patient location during evaluation: PACU Anesthesia Type: MAC Level of consciousness: awake and alert Pain management: pain level controlled Vital Signs Assessment: post-procedure vital signs reviewed and stable Respiratory status: spontaneous breathing, nonlabored ventilation, respiratory function stable and patient connected to nasal cannula oxygen Cardiovascular status: stable and blood pressure returned to baseline Postop Assessment: no apparent nausea or vomiting Anesthetic complications: no    Last Vitals:  Vitals:   08/29/17 0955 08/29/17 0957  BP: 114/76   Pulse: 60 60  Resp: 16 (!) 21  Temp:    SpO2: 100% 100%    Last Pain:  Vitals:   08/29/17 1012  TempSrc:   PainSc: 0-No pain                 Johanna Matto P Annlouise Gerety

## 2017-08-29 NOTE — Discharge Instructions (Signed)
Ok to shower starting tomorrow  Ice pack, tylenol, ibuprofen for pain

## 2017-08-29 NOTE — Op Note (Signed)
EXCISION OF LEFT NECK MASS  Procedure Note  Darrell [name] 08/29/2017   Pre-op Diagnosis: LEFT NECK MASS      Post-op Diagnosis: LEFT NECK 3 CM SEBACEOUS CYST  Procedure(s): EXCISION OF LEFT NECK MASS (3 CM)  Surgeon(s): Coralie Keens, MD  Anesthesia: Monitor Anesthesia Care  Staff:  Circulator: Rosanne Sack, RN Scrub Person: Paulette Blanch, RN  Estimated Blood Loss: Minimal               Findings: The patient was found to have [name] superficial 3 cm sebaceous cyst as the left neck mass  Procedure: The patient was brought to the operating room and identified as correct patient.  He was placed upon the operating table and anesthesia was induced.  His left neck was then prepped and draped in the usual sterile fashion.  The 3 cm mass was located in the posterior triangle.  I anesthetized the skin over the top of the mass with lidocaine.  I made an incision with [name] scalpel.  It was quite evident that this was [name] sebaceous cyst.  I remove the sebaceous debris and then the capsule in its entirety.  I elected not to send the mass to pathology as it appeared consistent with [name] sebaceous cyst.  Again it was quite superficial.  I achieved hemostasis with the cautery.  I then injected the incision with Marcaine.  I then closed the subtenons tissue with interrupted 3-0 Vicryl sutures and closed the skin with [name] running 4-0 Monocryl.  Dermabond was then applied.  The patient tolerated procedure well.  All counts were correct at the end of the procedure.  He was then taken in [name] stable condition to the recovery room.          Darrell [name]   Date: 08/29/2017  Time: 8:56 AM

## 2017-08-29 NOTE — Interval H&P Note (Signed)
History and Physical Interval Note:no change in H and P  08/29/2017 8:12 AM  Darrell [name]  has presented today for surgery, with the diagnosis of LEFT NECK MASS   The various methods of treatment have been discussed with the patient and family. After consideration of risks, benefits and other options for treatment, the patient has consented to  Procedure(s): EXCISION OF LEFT NECK MASS (Left) as a surgical intervention .  The patient's history has been reviewed, patient examined, no change in status, stable for surgery.  I have reviewed the patient's chart and labs.  Questions were answered to the patient's satisfaction.     Darrell [name] A

## 2017-08-29 NOTE — Anesthesia Preprocedure Evaluation (Addendum)
Anesthesia Evaluation  Patient identified by MRN, date of birth, ID band Patient awake    Reviewed: Allergy & Precautions, NPO status , Patient's Chart, lab work & pertinent test results, reviewed documented beta blocker date and time   Airway Mallampati: III  TM Distance: >3 FB Neck ROM: Full    Dental  (+) Edentulous Upper, Missing   Pulmonary Current Smoker,    breath sounds clear to auscultation       Cardiovascular hypertension, Pt. on medications and Pt. on home beta blockers + pacemaker  Rhythm:Regular Rate:Normal  ECG: SR, rate 73. RBBB  Sees cardiologist [facility])  Pacemaker check in clinic. Normal device function. Thresholds, sensing, impedances consistent with previous measurements. Device programmed to maximize longevity. 1 SVT episode and 1 high ventricular rate - EGM shows 1:1 and NSVT x 8beats. Device  programmed at appropriate safety margins. Histogram distribution appropriate for patient activity level. Device programmed to optimize intrinsic conduction. Estimated longevity 11.36yr. Remote check 10/23 and ROV with WC in [id number] ClockRN, BSN   Neuro/Psych Seizures -, Well Controlled,  CIDP (chronic inflammatory demyelinating polyneuropathy) CVA negative psych ROS   GI/Hepatic Neg liver ROS, GERD  Medicated and Controlled,  Endo/Other  negative endocrine ROS  Renal/GU CRFRenal disease     Musculoskeletal LEFT NECK MASS    Abdominal   Peds  Hematology  (+) anemia ,   Anesthesia Other Findings HLD Ambulates with walker  Reproductive/Obstetrics                            Anesthesia Physical  Anesthesia Plan  ASA: III  Anesthesia Plan: MAC   Post-op Pain Management:    Induction: Intravenous  PONV Risk Score and Plan: Ondansetron, Dexamethasone and Propofol infusion  Airway Management Planned: Natural Airway  Additional Equipment:   Intra-op Plan:    Post-operative Plan:   Informed Consent: I have reviewed the patients History and Physical, chart, labs and discussed the procedure including the risks, benefits and alternatives for the proposed anesthesia with the patient or authorized representative who has indicated his/her understanding and acceptance.   Dental advisory given  Plan Discussed with: CRNA  Anesthesia Plan Comments:        Anesthesia Quick Evaluation

## 2017-08-30 ENCOUNTER — Encounter (HOSPITAL_COMMUNITY): Payer: Self-pay | Admitting: Surgery

## 2017-09-03 ENCOUNTER — Other Ambulatory Visit: Payer: Self-pay | Admitting: Internal Medicine

## 2017-09-03 DIAGNOSIS — I6930 Unspecified sequelae of cerebral infarction: Secondary | ICD-10-CM

## 2017-09-04 ENCOUNTER — Institutional Professional Consult (permissible substitution): Payer: Medicare Other | Admitting: Diagnostic Neuroimaging

## 2017-09-05 ENCOUNTER — Encounter: Payer: Self-pay | Admitting: *Deleted

## 2017-09-05 ENCOUNTER — Other Ambulatory Visit: Payer: Self-pay | Admitting: *Deleted

## 2017-09-05 MED FILL — METOPROLOL TARTRATE 25 MG T: 25 | 30 days supply | Qty: 60 | Fill #1

## 2017-09-05 MED FILL — ATORVASTATIN 40 MG TABLET: 40 | 30 days supply | Qty: 30 | Fill #0

## 2017-09-05 NOTE — Patient Outreach (Signed)
[facility]) Care Management  09/05/2017  Darrell [name] [birth year] [id number]   CSW was able to make contact with patient's wife, Abdimalik Mayorquin today to follow-up regarding social work services and resources for patient.  CSW was also able to confirm that [name] was able to make contact with the list of agencies provided to her by CSW to see about having a wheelchair ramp installed at their home.  [name] admitted that she has made calls to each of the agencies and is currently awaiting a call back from a few of the agencies that [name] has referred her too.  [name] is hopeful that [name] will be able to come through for them.  [name] was most appreciative of all CSW's assistance, agreeing to contact CSW directly if none of the resources work out for them, as CSW agreed to try and research additional agencies that may be able to offer assistance. CSW will perform a case closure on patient, as all goals of treatment have been met from social work standpoint and no additional social work needs have been identified at this time.  CSW will fax an update to patient's Primary Care Physician, Dr. Boykin Nearing to ensure that they are aware of CSW's involvement with patient's plan of care.  CSW will submit a case closure request to [name], Care Management Assistant with [facility], in the form of an [employer].   [name], BSW, MSW, LCSW  Licensed Education officer, [facility]  Mailing [place] [address] [place], [place] [address] Physical Address-[address], [place], [place] [address] Toll Free Main # [phone] Fax # [phone] Cell # [phone]  Office # [phone] [email or web address]

## 2017-09-07 ENCOUNTER — Ambulatory Visit: Payer: Medicare PPO | Attending: Family Medicine | Admitting: Family Medicine

## 2017-09-07 ENCOUNTER — Encounter: Payer: Self-pay | Admitting: Family Medicine

## 2017-09-07 VITALS — BP 119/81 | HR 69 | Temp 97.8°F | Ht 76.0 in | Wt 167.8 lb

## 2017-09-07 DIAGNOSIS — Z9889 Other specified postprocedural states: Secondary | ICD-10-CM | POA: Insufficient documentation

## 2017-09-07 DIAGNOSIS — I6930 Unspecified sequelae of cerebral infarction: Secondary | ICD-10-CM

## 2017-09-07 DIAGNOSIS — N3000 Acute cystitis without hematuria: Secondary | ICD-10-CM | POA: Diagnosis not present

## 2017-09-07 DIAGNOSIS — I129 Hypertensive chronic kidney disease with stage 1 through stage 4 chronic kidney disease, or unspecified chronic kidney disease: Secondary | ICD-10-CM | POA: Diagnosis present

## 2017-09-07 DIAGNOSIS — Z950 Presence of cardiac pacemaker: Secondary | ICD-10-CM

## 2017-09-07 DIAGNOSIS — Z7982 Long term (current) use of aspirin: Secondary | ICD-10-CM | POA: Diagnosis not present

## 2017-09-07 DIAGNOSIS — R569 Unspecified convulsions: Secondary | ICD-10-CM | POA: Diagnosis not present

## 2017-09-07 DIAGNOSIS — Z79899 Other long term (current) drug therapy: Secondary | ICD-10-CM | POA: Diagnosis not present

## 2017-09-07 DIAGNOSIS — N183 Chronic kidney disease, stage 3 unspecified: Secondary | ICD-10-CM

## 2017-09-07 DIAGNOSIS — I10 Essential (primary) hypertension: Secondary | ICD-10-CM

## 2017-09-07 LAB — POCT URINALYSIS DIPSTICK
BILIRUBIN UA: NEGATIVE
Glucose, UA: NEGATIVE
KETONES UA: NEGATIVE
NITRITE UA: NEGATIVE
PH UA: 5.5 (ref 5.0–8.0)
PROTEIN UA: NEGATIVE
RBC UA: NEGATIVE
SPEC GRAV UA: 1.01 (ref 1.010–1.025)
UROBILINOGEN UA: 0.2 U/dL

## 2017-09-07 MED ORDER — AMLODIPINE BESYLATE 10 MG PO TABS: 10.0000 mg | ORAL_TABLET | Freq: Every morning | ORAL | 0 refills | Status: DC

## 2017-09-07 MED ORDER — CIPROFLOXACIN HCL 500 MG PO TABS: 500.0000 mg | ORAL_TABLET | Freq: Two times a day (BID) | ORAL | 0 refills | Status: DC

## 2017-09-07 MED ORDER — HYDRALAZINE HCL 50 MG PO TABS: 50.0000 mg | ORAL_TABLET | Freq: Three times a day (TID) | ORAL | 0 refills | Status: DC

## 2017-09-07 MED ORDER — LISINOPRIL 10 MG PO TABS: 10.0000 mg | ORAL_TABLET | Freq: Every day | ORAL | 3 refills | Status: DC

## 2017-09-07 MED FILL — LISINOPRIL 10 MG TABS: 10 | 30 days supply | Qty: 30 | Fill #0

## 2017-09-07 MED FILL — CIPROFLOXACIN HCL 500 MG TA: 500 | 10 days supply | Qty: 20 | Fill #0

## 2017-09-07 NOTE — Progress Notes (Signed)
Subjective:  Patient ID: Darrell [name], male    DOB: 1951/04/23  Age: 66 y.o. MRN: [id number]  CC: Hypertension and Establish Care   HPI Darrell [name] is a 66 year old male with a history of hypertension, previous CVA with residual left-sided weakness, new onset seizure in the setting of prior CVA, bradycardia (status post pacemaker) stage III CKD who presents today to establish care with me.  He endorses compliance with his medications and tolerates them without any adverse effects.  I have personally reviewed all the bottles he has with him. He has been compliant with his Depakote and has not had any recent seizures and has an upcoming appointment with neurology- Dr Stark Klein  He recently underwent excision of left neck mass on 08/29/17 and has a follow-up appointment with Dr. Ninfa Linden; the wound is healing well but he is concerned it still has some swelling.  He is accompanied by his wife who complains patient has had urinary frequency for the last few weeks but the patient denies abdominal pain, fever or dysuria and has had no hematuria.  Past Medical History:  Diagnosis Date  . Arthritis   . At high risk for falls 08/16/2015  . Bradycardia   . Chronic kidney disease    stage 3 GFR 30-59 ml/min   . CIDP (chronic inflammatory demyelinating polyneuropathy) ([place])   . CKD (chronic kidney disease) stage 3, GFR 30-59 ml/min ([name]) 08/16/2015  . Dysphagia as late effect of cerebrovascular disease    pts wife states pt has to eat soft foods   . Elevated liver enzymes 08/10/2016  . GERD (gastroesophageal reflux disease)   . Glaucoma   . High cholesterol   . History of CVA with residual deficit 03/25/2013  . Hypertension   . Hypertensive retinopathy of both eyes 01/16/2017  . Inguinal hernia 03/25/2013  . Liver hemangioma 08/14/2016  . Neuromuscular disorder ([place])    chronic inflammatory demyelinating polyneuropathy   . New onset seizure ([place]) 07/08/2017  . Nuclear sclerosis of both eyes  10/25/2016  . Presence of permanent cardiac pacemaker    placed in april 2018  . Primary open angle glaucoma of both eyes, indeterminate stage 10/25/2016  . Renal mass, right 08/14/2016  . Status cardiac pacemaker 01/29/2017   Placed for second degree heart block on 01/16/17 Medtronic Azure XT DR MRI SureScan dual-chamber pacemaker  . Stroke [facility])    2011 with residual deficit left sided weakness  . Tobacco dependence     Past Surgical History:  Procedure Laterality Date  . EYE SURGERY    . HERNIA REPAIR    . INGUINAL HERNIA REPAIR Right 11/23/2014   Procedure: right inguinal hernia repair with mesh;  Surgeon: Armandina Gemma, MD;  Location: [facility];  Service: General;  Laterality: Right;  . INSERTION OF MESH N/A 11/23/2014   Procedure: INSERTION OF MESH;  Surgeon: Armandina Gemma, MD;  Location: [facility];  Service: General;  Laterality: N/A;  . MASS EXCISION Left 08/29/2017   Procedure: EXCISION OF LEFT NECK MASS;  Surgeon: Coralie Keens, MD;  Location: [place];  Service: General;  Laterality: Left;  . PACEMAKER IMPLANT N/A 01/16/2017   Procedure: Pacemaker Implant;  Surgeon: Will Meredith Leeds, MD;  Location: [facility];  Service: Cardiovascular;  Laterality: N/A;  . SHOULDER SURGERY Bilateral 1988, 1998    Allergies  Allergen Reactions  . Doxycycline Other (See Comments)    Hiccups, cough, nausea and emesis, elevated liver enzymes, elevated eosinophils, SOB concerning for early  DRESS syndrome   . Atacand Hct [Candesartan Cilexetil-Hctz] Hives  . Shellfish Allergy Hives     Outpatient Medications Prior to Visit  Medication Sig Dispense Refill  . acetaminophen (TYLENOL) 325 MG tablet Take 325 mg by mouth every 6 (six) hours as needed for mild pain or headache. Take 2 tablets every 6 hours as needed for fever greater than 101.     Marland Kitchen aspirin EC 81 MG tablet Take 1 tablet (81 mg total) by mouth daily. 90 tablet 3  . atorvastatin (LIPITOR) 40 MG tablet TAKE 1 TABLET BY MOUTH DAILY. 90  tablet 0  . brimonidine (ALPHAGAN P) 0.1 % SOLN Place 1 drop into both eyes daily.     . divalproex (DEPAKOTE) 500 MG DR tablet Take 1 tablet (500 mg total) by mouth 2 (two) times daily. 60 tablet 1  . dorzolamide-timolol (COSOPT) 22.3-6.8 MG/ML ophthalmic solution Place 1 drop into both eyes daily.     . fluticasone (FLONASE) 50 MCG/ACT nasal spray Place 2 sprays into both nostrils daily. 16 g 6  . folic acid (FOLVITE) 1 MG tablet Take 1 tablet (1 mg total) by mouth daily. 90 tablet 1  . ketoconazole (NIZORAL) 2 % cream Apply 1 application topically daily as needed for irritation.    Marland Kitchen latanoprost (XALATAN) 0.005 % ophthalmic solution Place 1 drop into both eyes at bedtime.    . Multiple Vitamins-Iron (DAILY VITAMIN FORMULA+IRON) TABS Take 1 tablet by mouth daily.    . polyethylene glycol (MIRALAX / GLYCOLAX) packet Take 17 g by mouth daily as needed for mild constipation.    Orlie Dakin Sodium (SENNA S PO) Take 1 tablet by mouth daily as needed (constipation).     . terbinafine (LAMISIL AT) 1 % cream Apply 1 application topically 2 (two) times daily. To R foot (Patient taking differently: Apply 1 application topically daily as needed (rash). ) 30 g 0  . torsemide (DEMADEX) 20 MG tablet Take 20 mg by mouth daily.    Marland Kitchen amLODipine (NORVASC) 10 MG tablet Take 1 tablet (10 mg total) by mouth every morning. 90 tablet 0  . esomeprazole (NEXIUM) 20 MG capsule Take 20 mg by mouth daily at 12 noon.    . hydrALAZINE (APRESOLINE) 50 MG tablet Take 1 tablet (50 mg total) by mouth 3 (three) times daily. 270 tablet 0  . lisinopril (PRINIVIL,ZESTRIL) 20 MG tablet Take 20 mg by mouth daily.    . metoprolol tartrate (LOPRESSOR) 25 MG tablet Take 1 tablet (25 mg total) by mouth 2 (two) times daily. 180 tablet 3  . thiamine 100 MG tablet Take 1 tablet (100 mg total) by mouth daily. (Patient not taking: Reported on 09/07/2017) 30 tablet 0  . traMADol (ULTRAM) 50 MG tablet Take 1 tablet (50 mg total) by  mouth every 6 (six) hours as needed for moderate pain. (Patient not taking: Reported on 09/07/2017) 15 tablet 0   No facility-administered medications prior to visit.     ROS Review of Systems  Constitutional: Negative for activity change and appetite change.  HENT: Negative for sinus pressure and sore throat.   Eyes: Negative for visual disturbance.  Respiratory: Negative for cough, chest tightness and shortness of breath.   Cardiovascular: Negative for chest pain and leg swelling.  Gastrointestinal: Negative for abdominal distention, abdominal pain, constipation and diarrhea.  Endocrine: Negative.   Genitourinary: Positive for frequency. Negative for dysuria.  Musculoskeletal: Negative for joint swelling and myalgias.  Skin: Negative for rash.  Allergic/Immunologic: Negative.  Neurological: Positive for weakness. Negative for light-headedness and numbness.  Psychiatric/Behavioral: Negative for dysphoric mood and suicidal ideas.    Objective:  BP 119/81   Pulse 69   Temp 97.8 F (36.6 C) (Oral)   Ht '6\' 4"'$  (1.93 m)   Wt 167 lb 12.8 oz (76.1 kg)   SpO2 99%   BMI 20.43 kg/m   BP/Weight 09/07/2017 08/29/2017 73/71/0626  Systolic BP 948 546 270  Diastolic BP 81 76 72  Wt. (Lbs) 167.8 - 166.1  BMI 20.43 - 20.22      Physical Exam  Constitutional: He is oriented to person, place, and time. He appears well-developed and well-nourished.  Appears comfortable, sitting in a wheelchair  Cardiovascular: Normal rate, normal heart sounds and intact distal pulses.  No murmur heard. Pulmonary/Chest: Effort normal and breath sounds normal. He has no wheezes. He has no rales. He exhibits no tenderness.  Abdominal: Soft. Bowel sounds are normal. He exhibits no distension and no mass. There is no tenderness.  Musculoskeletal: Normal range of motion.  Neurological: He is alert and oriented to person, place, and time.  Slightly reduced motor strength in left upper and left lower extremity   Skin:  Lateral surgical incision on left side of neck with superficial seroma, no discharge and no tenderness     Assessment & Plan:   1. Essential hypertension Controlled Low-sodium, DASH diet He did have a potassium which was on the upper border of normal at 5.2 and I have reduced his lisinopril dose given his blood pressure is also on the soft side. - amLODipine (NORVASC) 10 MG tablet; Take 1 tablet (10 mg total) by mouth every morning.  Dispense: 90 tablet; Refill: 0 - hydrALAZINE (APRESOLINE) 50 MG tablet; Take 1 tablet (50 mg total) by mouth 3 (three) times daily.  Dispense: 270 tablet; Refill: 0 - lisinopril (PRINIVIL,ZESTRIL) 10 MG tablet; Take 1 tablet (10 mg total) by mouth daily.  Dispense: 30 tablet; Refill: 3 - CMP14+EGFR  2. CKD (chronic kidney disease) stage 3, GFR 30-59 ml/min ([name]) Hypertensive nephropathy Avoid nephrotoxins  3. History of CVA with residual deficit With residual slight left sided weakness Fall precautions  4. New onset seizure ([place]) New onset seizure in the setting of CVA No recent seizures Continue Depakote Keep appointment with neurology  5. Status cardiac pacemaker Stable Follow-up with cardiology  6. Acute cystitis without hematuria - POCT urinalysis dipstick - ciprofloxacin (CIPRO) 500 MG tablet; Take 1 tablet (500 mg total) by mouth 2 (two) times daily.  Dispense: 20 tablet; Refill: 0   Meds ordered this encounter  Medications  . amLODipine (NORVASC) 10 MG tablet    Sig: Take 1 tablet (10 mg total) by mouth every morning.    Dispense:  90 tablet    Refill:  0  . hydrALAZINE (APRESOLINE) 50 MG tablet    Sig: Take 1 tablet (50 mg total) by mouth 3 (three) times daily.    Dispense:  270 tablet    Refill:  0  . lisinopril (PRINIVIL,ZESTRIL) 10 MG tablet    Sig: Take 1 tablet (10 mg total) by mouth daily.    Dispense:  30 tablet    Refill:  3  . ciprofloxacin (CIPRO) 500 MG tablet    Sig: Take 1 tablet (500 mg total) by mouth  2 (two) times daily.    Dispense:  20 tablet    Refill:  0    Follow-up: Return in about 3 months (around 12/06/2017) for Top of chronic  medical conditions.   Arnoldo Morale MD

## 2017-09-07 NOTE — Patient Instructions (Signed)
Seizure, Adult °When you have a seizure: °· Parts of your body may move. °· How aware or awake (conscious) you are may change. °· You may shake (convulse). ° °Some people have symptoms right before a seizure happens. These symptoms may include: °· Fear. °· Worry (anxiety). °· Feeling like you are going to throw up (nausea). °· Feeling like the room is spinning (vertigo). °· Feeling like you saw or heard something before (deja vu). °· Odd tastes or smells. °· Changes in vision, such as seeing flashing lights or spots. ° °Seizures usually last from 30 seconds to 2 minutes. Usually, they are not harmful unless they last a long time. °Follow these instructions at home: °Medicines °· Take over-the-counter and prescription medicines only as told by your doctor. °· Avoid anything that may keep your medicine from working, such as alcohol. °Activity °· Do not do any activities that would be dangerous if you had another seizure, like driving or swimming. Wait until your doctor approves. °· If you live in the [place] ask your local DMV (department of motor vehicles) when you can drive. °· Rest. °Teaching others °· Teach friends and family what to do when you have a seizure. They should: °? Lay you on the ground. °? Protect your head and body. °? Loosen any tight clothing around your neck. °? Turn you on your side. °? Stay with you until you are better. °? Not hold you down. °? Not put anything in your mouth. °? Know whether or not you need emergency care. °General instructions °· Contact your doctor each time you have a seizure. °· Avoid anything that gives you seizures. °· Keep a seizure diary. Write down: °? What you think caused each seizure. °? What you remember about each seizure. °· Keep all follow-up visits as told by your doctor. This is important. °Contact a doctor if: °· You have another seizure. °· You have seizures more often. °· There is any change in what happens during your seizures. °· You continue to have  seizures with treatment. °· You have symptoms of being sick or having an infection. °Get help right away if: °· You have a seizure: °? That lasts longer than 5 minutes. °? That is different than seizures you had before. °? That makes it harder to breathe. °? After you hurt your head. °· After a seizure, you cannot speak or use a part of your body. °· After a seizure, you are confused or have a bad headache. °· You have two or more seizures in a row. °· You are having seizures more often. °· You do not wake up right after a seizure. °· You get hurt during a seizure. °In an emergency: °· These symptoms may be an emergency. Do not wait to see if the symptoms will go away. Get medical help right away. Call your local emergency services (911 in the [place] Do not drive yourself to the hospital. °This information is not intended to replace advice given to you by your health care provider. Make sure you discuss any questions you have with your health care provider. °Document Released: 02/28/2008 Document Revised: 05/24/2016 Document Reviewed: 05/24/2016 °Elsevier Interactive Patient Education © 2017 [employer] Inc[employer] ° °

## 2017-09-08 LAB — CMP14+EGFR
ALT: 8 IU/L (ref 0–44)
AST: 10 IU/L (ref 0–40)
Albumin/Globulin Ratio: 1.3 (ref 1.2–2.2)
Albumin: 4.1 g/dL (ref 3.6–4.8)
Alkaline Phosphatase: 72 IU/L (ref 39–117)
BUN/Creatinine Ratio: 15 (ref 10–24)
BUN: 35 mg/dL — AB (ref 8–27)
Bilirubin Total: <0.2 mg/dL (ref 0.0–1.2)
CALCIUM: 10.2 mg/dL (ref 8.6–10.2)
CO2: 24 mmol/L (ref 20–29)
Chloride: 103 mmol/L (ref 96–106)
Creatinine, Ser: 2.4 mg/dL — ABNORMAL HIGH (ref 0.76–1.27)
GFR, EST AFRICAN AMERICAN: 32 mL/min/{1.73_m2} — AB (ref >59)
GFR, EST NON AFRICAN AMERICAN: 27 mL/min/{1.73_m2} — AB (ref >59)
GLUCOSE: 101 mg/dL — AB (ref 65–99)
Globulin, Total: 3.1 g/dL (ref 1.5–4.5)
Potassium: 5.6 mmol/L — ABNORMAL HIGH (ref 3.5–5.2)
Sodium: 141 mmol/L (ref 134–144)
TOTAL PROTEIN: 7.2 g/dL (ref 6.0–8.5)

## 2017-09-10 ENCOUNTER — Encounter: Payer: Self-pay | Admitting: Family Medicine

## 2017-09-10 ENCOUNTER — Other Ambulatory Visit: Payer: Self-pay | Admitting: Family Medicine

## 2017-09-10 DIAGNOSIS — E875 Hyperkalemia: Secondary | ICD-10-CM

## 2017-09-10 MED ORDER — SODIUM POLYSTYRENE SULFONATE 15 GM/60ML PO SUSP: 15.0000 g | Freq: Once | ORAL | 0 refills | Status: AC

## 2017-09-10 MED FILL — KIONEX 15 GM/60 ML SUS: 15 | 1 days supply | Qty: 60 | Fill #0

## 2017-09-11 ENCOUNTER — Telehealth: Payer: Self-pay

## 2017-09-11 ENCOUNTER — Other Ambulatory Visit: Payer: Self-pay | Admitting: Family Medicine

## 2017-09-11 MED ORDER — SODIUM POLYSTYRENE SULFONATE 15 GM/60ML PO SUSP: 15.0000 g | Freq: Once | ORAL | 0 refills | Status: AC

## 2017-09-11 MED FILL — KIONEX 15 GM/60 ML SUS: 15 | 60 days supply | Qty: 60 | Fill #0

## 2017-09-11 NOTE — Telephone Encounter (Signed)
Darrell [name] was called and given lab results and pt was informed of lab appointment in 2 weeks.

## 2017-09-12 MED FILL — FOLIC ACID 1 MG TABLET: 1 | 30 days supply | Qty: 30 | Fill #0

## 2017-09-12 MED FILL — DIVALPROEX SOD DR 500 MG TA: 500 | 30 days supply | Qty: 60 | Fill #0

## 2017-09-13 ENCOUNTER — Encounter: Payer: Self-pay | Admitting: Diagnostic Neuroimaging

## 2017-09-13 ENCOUNTER — Ambulatory Visit: Payer: Medicare PPO | Admitting: Diagnostic Neuroimaging

## 2017-09-13 VITALS — BP 118/77 | HR 69 | Ht 76.0 in | Wt 167.0 lb

## 2017-09-13 DIAGNOSIS — G6181 Chronic inflammatory demyelinating polyneuritis: Secondary | ICD-10-CM | POA: Diagnosis not present

## 2017-09-13 DIAGNOSIS — R569 Unspecified convulsions: Secondary | ICD-10-CM

## 2017-09-13 NOTE — Progress Notes (Signed)
[facility]  PATIENT: Darrell [name] DOB: [birth year]  REFERRING CLINICIAN: Vanita Panda HISTORY FROM: patient and wife REASON FOR VISIT: follow up   HISTORICAL  CHIEF COMPLAINT:  Chief Complaint  Patient presents with  . Seizures    rm 7, hospital FU, wife- [name], "new onset seizure 07/08/17"  . Follow-up    HISTORY OF PRESENT ILLNESS:   NEW HPI (09/13/17, VRP): 66 year old male with CIDP, here for seizure evaluation. Since last visit, patient declined IVIG therapy that I had ordered in 2015, and then patient lost to followup. Gait and balance continue to decline. 20lb weight loss over last year. Now with new onset seizure in Oct 2018 at home (witnessed by wife). Admitted to hospital and started on levetiracetam, then changed to divalproex 500mg  twice a day, and doing well. No further seizures. No alleviating or aggravating factors.   UPDATE 03/30/14: Since last visit, patient feels strength is stable. Still with slurred speech, swallow diff, balance diff. Now he is interested in IVIG treatment. Completed PT sessions.  UPDATE 11/25/13: Since last visit patient had EMG and spinal tap. Findings are suspicious for CIDP. Patient's symptoms have gradually progressed. Continues to have more upper and lower extremity weakness, dysarthria, dysphasia and intermittent muscle twitching. Patient is frustrated today and does not want to take anymore medication. Patient's wife would like to find out more options for treatment.  UPDATE 10/03/13: Since last visit, continues to have left sided weakness, worsening speech and swallow difficulty. Is drinking half pint Etoh per 1-2 weeks. Cigarette use has increased to 2 packs per day.  PRIOR HPI (04/03/13): 66 year old right-handed male with hypertension, hypercholesterolemia, smoking, alcohol abuse, here for evaluation of gait and balance difficulty for past one year.  2011 patient had left arm and left leg weakness, diagnosed with  stroke. Apparently he was admitted to hospital and treated. He had fairly good recovery initial one year after stroke. He required using a walking stick but otherwise was fairly mobile. Unfortunately he did not have good medical followup at that time. Patient was abusing alcohol significantly at that time, ranging from 1 pint up to a fifth of alcohol per day. In the past one to 2 years he has cut down on his drinking, now averaging 1 pint of alcohol per week.  In the past one year, patient's mobility is significantly deteriorated. He also has increasing slurred speech, trouble swallowing, balance and gait difficulty. He continues to fall down. Symptoms are worse when he drinks alcohol. Nowadays he is unable to stand up and walk across a room without assistance. This is been the case for the past one year.  REVIEW OF SYSTEMS: Full 14 system review of systems performed and negative except: fatigue loss of vision cough seizure frequent waking.    ALLERGIES: Allergies  Allergen Reactions  . Doxycycline Other (See Comments)    Hiccups, cough, nausea and emesis, elevated liver enzymes, elevated eosinophils, SOB concerning for early DRESS syndrome   . Atacand Hct [Candesartan Cilexetil-Hctz] Hives  . Shellfish Allergy Hives    HOME MEDICATIONS: Outpatient Medications Prior to Visit  Medication Sig Dispense Refill  . acetaminophen (TYLENOL) 325 MG tablet Take 325 mg by mouth every 6 (six) hours as needed for mild pain or headache. Take 2 tablets every 6 hours as needed for fever greater than 101.     Marland Kitchen amLODipine (NORVASC) 10 MG tablet Take 1 tablet (10 mg total) by mouth every morning. 90 tablet 0  .  aspirin EC 81 MG tablet Take 1 tablet (81 mg total) by mouth daily. 90 tablet 3  . atorvastatin (LIPITOR) 40 MG tablet TAKE 1 TABLET BY MOUTH DAILY. 90 tablet 0  . brimonidine (ALPHAGAN P) 0.1 % SOLN Place 1 drop into both eyes daily.     . ciprofloxacin (CIPRO) 500 MG tablet Take 1 tablet (500 mg  total) by mouth 2 (two) times daily. 20 tablet 0  . divalproex (DEPAKOTE) 500 MG DR tablet Take 1 tablet (500 mg total) by mouth 2 (two) times daily. 60 tablet 1  . dorzolamide-timolol (COSOPT) 22.3-6.8 MG/ML ophthalmic solution Place 1 drop into both eyes daily.     . fluticasone (FLONASE) 50 MCG/ACT nasal spray Place 2 sprays into both nostrils daily. 16 g 6  . folic acid (FOLVITE) 1 MG tablet Take 1 tablet (1 mg total) by mouth daily. 90 tablet 1  . hydrALAZINE (APRESOLINE) 50 MG tablet Take 1 tablet (50 mg total) by mouth 3 (three) times daily. 270 tablet 0  . ketoconazole (NIZORAL) 2 % cream Apply 1 application topically daily as needed for irritation.    Marland Kitchen latanoprost (XALATAN) 0.005 % ophthalmic solution Place 1 drop into both eyes at bedtime.    Marland Kitchen lisinopril (PRINIVIL,ZESTRIL) 10 MG tablet Take 1 tablet (10 mg total) by mouth daily. 30 tablet 3  . metoprolol tartrate (LOPRESSOR) 25 MG tablet Take 1 tablet (25 mg total) by mouth 2 (two) times daily. 180 tablet 3  . polyethylene glycol (MIRALAX / GLYCOLAX) packet Take 17 g by mouth daily as needed for mild constipation.    Orlie Dakin Sodium (SENNA S PO) Take 1 tablet by mouth daily as needed (constipation).     . terbinafine (LAMISIL AT) 1 % cream Apply 1 application topically 2 (two) times daily. To R foot (Patient taking differently: Apply 1 application topically daily as needed (rash). ) 30 g 0  . torsemide (DEMADEX) 20 MG tablet Take 20 mg by mouth daily.    . Multiple Vitamins-Iron (DAILY VITAMIN FORMULA+IRON) TABS Take 1 tablet by mouth daily.    Marland Kitchen thiamine 100 MG tablet Take 1 tablet (100 mg total) by mouth daily. (Patient not taking: Reported on 09/07/2017) 30 tablet 0  . traMADol (ULTRAM) 50 MG tablet Take 1 tablet (50 mg total) by mouth every 6 (six) hours as needed for moderate pain. (Patient not taking: Reported on 09/07/2017) 15 tablet 0   No facility-administered medications prior to visit.     PAST MEDICAL  HISTORY: Past Medical History:  Diagnosis Date  . Arthritis   . At high risk for falls 08/16/2015  . Bradycardia   . Chronic kidney disease    stage 3 GFR 30-59 ml/min   . CIDP (chronic inflammatory demyelinating polyneuropathy) ([place])   . CKD (chronic kidney disease) stage 3, GFR 30-59 ml/min ([name]) 08/16/2015  . Dysphagia as late effect of cerebrovascular disease    pts wife states pt has to eat soft foods   . Elevated liver enzymes 08/10/2016  . GERD (gastroesophageal reflux disease)   . Glaucoma   . High cholesterol   . History of CVA with residual deficit 03/25/2013  . Hypertension   . Hypertensive retinopathy of both eyes 01/16/2017  . Inguinal hernia 03/25/2013  . Liver hemangioma 08/14/2016  . Neuromuscular disorder ([place])    chronic inflammatory demyelinating polyneuropathy   . New onset seizure ([place]) 07/08/2017  . Nuclear sclerosis of both eyes 10/25/2016  . Presence of permanent cardiac pacemaker  placed in april 2018  . Primary open angle glaucoma of both eyes, indeterminate stage 10/25/2016  . Renal mass, right 08/14/2016  . Status cardiac pacemaker 01/29/2017   Placed for second degree heart block on 01/16/17 Medtronic Azure XT DR MRI SureScan dual-chamber pacemaker  . Stroke [facility])    2011 with residual deficit left sided weakness  . Tobacco dependence     PAST SURGICAL HISTORY: Past Surgical History:  Procedure Laterality Date  . EYE SURGERY    . HERNIA REPAIR    . INGUINAL HERNIA REPAIR Right 11/23/2014   Procedure: right inguinal hernia repair with mesh;  Surgeon: Armandina Gemma, MD;  Location: [facility];  Service: General;  Laterality: Right;  . INSERTION OF MESH N/A 11/23/2014   Procedure: INSERTION OF MESH;  Surgeon: Armandina Gemma, MD;  Location: [facility];  Service: General;  Laterality: N/A;  . MASS EXCISION Left 08/29/2017   Procedure: EXCISION OF LEFT NECK MASS;  Surgeon: Coralie Keens, MD;  Location: [place];  Service: General;  Laterality: Left;  . PACEMAKER IMPLANT  N/A 01/16/2017   Procedure: Pacemaker Implant;  Surgeon: Will Meredith Leeds, MD;  Location: [place] [facility];  Service: Cardiovascular;  Laterality: N/A;  . SHOULDER SURGERY Bilateral 1988, 1998    FAMILY HISTORY: Family History  Problem Relation Age of Onset  . Hypertension Mother   . Diabetes Sister   . Hypertension Sister     SOCIAL HISTORY:  Social History   Socioeconomic History  . Marital status: Married    Spouse name: [name]  . Number of children: 1  . Years of education: college  . Highest education level: Not on file  Social Needs  . Financial resource strain: Not on file  . Food insecurity - worry: Not on file  . Food insecurity - inability: Not on file  . Transportation needs - medical: Not on file  . Transportation needs - non-medical: Not on file  Occupational History  . Occupation: retired  Tobacco Use  . Smoking status: Current Every Day Smoker    Packs/day: 1.00    Years: 40.00    Pack years: 40.00    Types: Cigarettes  . Smokeless tobacco: Never Used  . Tobacco comment: 09/13/17 little < 1 PPD  Substance and Sexual Activity  . Alcohol use: No    Comment: alcohol free for 1 year was drinking 1/2 pint per day   . Drug use: No  . Sexual activity: Not on file  Other Topics Concern  . Not on file  Social History Narrative   09/13/17 Patient lives at home with spouse.   Has 74 yo daughter   No grandchildren     PHYSICAL EXAM  Vitals:   09/13/17 1006  BP: 118/77  Pulse: 69  Weight: 167 lb (75.8 kg)  Height: 6\' 4"  (1.93 m)    Not recorded      Body mass index is 20.33 kg/m.  GENERAL EXAM: Patient is in no distress  CARDIOVASCULAR: Regular rate and rhythm, no murmurs, no carotid bruits  NEUROLOGIC: MENTAL STATUS: awake, alert, language fluent, comprehension intact, naming intact CRANIAL NERVE: no papilledema on fundoscopic exam, pupils equal and reactive to light, visual fields full to confrontation, extraocular muscles intact,  no nystagmus, facial sensation and strength symmetric, uvula midline, shoulder shrug symmetric, tongue midline. MOD DYSARTHIA. MOD DYSPHAGIA. NO TONGUE FASCICULATIONS. MOTOR: NO FASCICULATIONS. INCR TONE IN LUE AND LLE. RUE AND RLE 4/5. LUE AND LLE 3. BRADYKINETIC IN LUE AND LLE. SENSORY:  DECR IN LUE AND LLE COORDINATION: LIMITED IN LEFT SIDE. REFLEXES: BUE and BLE 2+, BUT SLIGHTLY MORE ON LEFT SIDE THAN RIGHT. GAIT/STATION: LEANS BACK IN WHEELCHAIR; CANNOT STAND INDEPENDENTLY   DIAGNOSTIC DATA (LABS, IMAGING, TESTING) - I reviewed patient records, labs, notes, testing and imaging myself where available.  Lab Results  Component Value Date   WBC 8.5 08/23/2017   HGB 12.3 (L) 08/23/2017   HCT 38.6 (L) 08/23/2017   MCV 92.3 08/23/2017   PLT 175 08/23/2017      Component Value Date/Time   NA 141 09/07/2017 1643   K 5.6 (H) 09/07/2017 1643   CL 103 09/07/2017 1643   CO2 24 09/07/2017 1643   GLUCOSE 101 (H) 09/07/2017 1643   GLUCOSE 89 08/29/2017 0702   BUN 35 (H) 09/07/2017 1643   CREATININE 2.40 (H) 09/07/2017 1643   CREATININE 1.45 (H) 08/10/2016 1647   CALCIUM 10.2 09/07/2017 1643   PROT 7.2 09/07/2017 1643   ALBUMIN 4.1 09/07/2017 1643   AST 10 09/07/2017 1643   ALT 8 09/07/2017 1643   ALKPHOS 72 09/07/2017 1643   BILITOT <0.2 09/07/2017 1643   GFRNONAA 27 (L) 09/07/2017 1643   GFRNONAA 51 (L) 08/10/2016 1647   GFRAA 32 (L) 09/07/2017 1643   GFRAA 58 (L) 08/10/2016 1647   Lab Results  Component Value Date   CHOL 141 08/16/2015   HDL 34 (L) 08/16/2015   LDLCALC 84 08/16/2015   TRIG 116 08/16/2015   CHOLHDL 4.1 08/16/2015   Lab Results  Component Value Date   HGBA1C 5.3 10/07/2013   Lab Results  Component Value Date   YKDXIPJA25 053 08/16/2015   Lab Results  Component Value Date   TSH 0.476 07/08/2017    02/27/13 CT head - severe chronic small vessel ischemic disease, periventricular, subcortical, pontine and cerebellar.   04/22/13 MRI BRAIN 1. Scattered  chronic lacunar ischemic infarctions in the bilateral thalami, right basal ganglia, right centrum semioval and left anterior parasagittal regions.  2. Moderate chronic small vessel ischemic disease. Few small, chronic cerebral microbleeds in the right parietal and and left frontal regions, also within spectrum of chronic small vessel ischemic disease. 3. No acute findings.  04/22/13 MRI CERVICAL SPINE 1. At C5-6: disc bulging and facet hypertrophy with mild spinal stenosis (AP diameter 12mm) and severe biforaminal foraminal stenosis; no cord signal changes. 2. Multilevel degenerative changes and foraminal stenosis from C2-3 to C6-7 as above.   10/20/13 EMG/NCS 1. 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 Abnormal F-wave latencies (including  absent F-waves and slowing in the demyelinating range in some  nerves) raises possibility of concomitant polyradiculopathy.  Considerations include chronic immune/inflammatory neuropathies,  such as CIDP. 2. No evidence of myopathy at this time.  10/31/13 CSF results: WBC 5-->0, RBC 25-->2, protein 63, glucose 62, ACE 5, cytology negative, culture negative  07/08/17 EEG - There is a posterior dominant rhythm of 7-8 Hz reactive to eye opening and closure.  No focal slowing is present.  No epileptiform discharges or seizures. - There is evidence of mild generalized slowing of brain activity consistent with probable post-ictal state +/- toxic, metabolic, or infectious encephalopathy.  The patient is not in non-convulsive status epilepticus.   07/09/17 MRI brain  - Truncated and motion degraded examination. Within that limitation, no acute ischemia or significant mass effect. Advanced chronic microvascular disease.    ASSESSMENT AND PLAN  66 y.o. year old male  has a past medical history of Arthritis, At high  risk for falls (08/16/2015), Bradycardia, Chronic kidney disease, CIDP (chronic inflammatory demyelinating  polyneuropathy) ([place]), CKD (chronic kidney disease) stage 3, GFR 30-59 ml/min ([name]) (08/16/2015), Dysphagia as late effect of cerebrovascular disease, Elevated liver enzymes (08/10/2016), GERD (gastroesophageal reflux disease), Glaucoma, High cholesterol, History of CVA with residual deficit (03/25/2013), Hypertension, Hypertensive retinopathy of both eyes (01/16/2017), Inguinal hernia (03/25/2013), Liver hemangioma (08/14/2016), Neuromuscular disorder ([place]), New onset seizure ([place]) (07/08/2017), Nuclear sclerosis of both eyes (10/25/2016), Presence of permanent cardiac pacemaker, Primary open angle glaucoma of both eyes, indeterminate stage (10/25/2016), Renal mass, right (08/14/2016), Status cardiac pacemaker (01/29/2017), Stroke ([place]), and Tobacco dependence. here with progressive gait and balance or deterioration since 2013. Also with dysarthria, dysphagia, left sided weakness and hyperreflexia.   Now with new onset seizure in Oct 2018. Now stable on divalproex 500mg  twice a day.   Dx1 gait and balance diff: CIDP vs chronic alcoholic neuropathy vs motor neuron disease  Dx2: new onset seizure  1. New onset seizure ([place])   2. CIDP (chronic inflammatory demyelinating polyneuropathy) ([name])      PLAN:  NEW ONSET SEIZURE (? Related to CNS vascular disease) - continue divalproex 500mg  twice a day  - check annual CBC, CMP  CIDP - offered IVIG trial, but patient declined  CEREBROVACULAR DISEASE - Risk factor mgmt per PCP for cerebrovascular disease - continue smoking and ETOH cessation  CERVICAL SPINAL STENOSIS - Conservative mgmt of cervical spinal stenosis (mild at C5-6)  Return in about 6 months (around 03/14/2018).    Penni Bombard, MD 78/[id number]/8478, 41:28 AM Certified in Neurology, Neurophysiology and Neuroimaging  [facility] [address], [place], [place] [address] [phone]

## 2017-09-27 ENCOUNTER — Ambulatory Visit: Payer: Medicare PPO | Attending: Family Medicine

## 2017-09-27 DIAGNOSIS — E875 Hyperkalemia: Secondary | ICD-10-CM | POA: Insufficient documentation

## 2017-09-27 NOTE — Progress Notes (Signed)
Patient here for lab visit only 

## 2017-09-28 LAB — BASIC METABOLIC PANEL
BUN / CREAT RATIO: 16 (ref 10–24)
BUN: 39 mg/dL — AB (ref 8–27)
CO2: 21 mmol/L (ref 20–29)
CREATININE: 2.49 mg/dL — AB (ref 0.76–1.27)
Calcium: 9.9 mg/dL (ref 8.6–10.2)
Chloride: 102 mmol/L (ref 96–106)
GFR calc non Af Amer: 26 mL/min/{1.73_m2} — ABNORMAL LOW (ref >59)
GFR, EST AFRICAN AMERICAN: 30 mL/min/{1.73_m2} — AB (ref >59)
Glucose: 78 mg/dL (ref 65–99)
Potassium: 5.1 mmol/L (ref 3.5–5.2)
Sodium: 140 mmol/L (ref 134–144)

## 2017-11-06 MED FILL — LISINOPRIL 10 MG TABS: 10 | 30 days supply | Qty: 30 | Fill #1

## 2017-11-15 MED FILL — hydrALAZINE HCL 50 MG TABS: 50 | 30 days supply | Qty: 90 | Fill #1

## 2017-11-16 MED FILL — LATANOPROST 0.005% EYE DRP: 0.005 | 25 days supply | Qty: 3 | Fill #0

## 2017-11-26 ENCOUNTER — Telehealth: Payer: Self-pay | Admitting: Cardiology

## 2017-11-26 ENCOUNTER — Encounter: Payer: Self-pay | Admitting: *Deleted

## 2017-11-26 NOTE — Telephone Encounter (Signed)
Spoke with pt and reminded pt of remote transmission that is due today. Pt verbalized understanding.   

## 2017-11-29 ENCOUNTER — Encounter: Payer: Self-pay | Admitting: Cardiology

## 2017-12-05 ENCOUNTER — Encounter: Payer: Self-pay | Admitting: Family Medicine

## 2017-12-05 ENCOUNTER — Ambulatory Visit: Payer: Medicare HMO | Attending: Family Medicine | Admitting: Family Medicine

## 2017-12-05 VITALS — BP 145/90 | HR 60 | Temp 98.0°F | Ht 76.0 in | Wt 180.0 lb

## 2017-12-05 DIAGNOSIS — M65342 Trigger finger, left ring finger: Secondary | ICD-10-CM

## 2017-12-05 DIAGNOSIS — Z888 Allergy status to other drugs, medicaments and biological substances status: Secondary | ICD-10-CM | POA: Insufficient documentation

## 2017-12-05 DIAGNOSIS — I129 Hypertensive chronic kidney disease with stage 1 through stage 4 chronic kidney disease, or unspecified chronic kidney disease: Secondary | ICD-10-CM | POA: Diagnosis not present

## 2017-12-05 DIAGNOSIS — W19XXXA Unspecified fall, initial encounter: Secondary | ICD-10-CM | POA: Diagnosis not present

## 2017-12-05 DIAGNOSIS — R569 Unspecified convulsions: Secondary | ICD-10-CM | POA: Diagnosis not present

## 2017-12-05 DIAGNOSIS — Z950 Presence of cardiac pacemaker: Secondary | ICD-10-CM

## 2017-12-05 DIAGNOSIS — I10 Essential (primary) hypertension: Secondary | ICD-10-CM

## 2017-12-05 DIAGNOSIS — N183 Chronic kidney disease, stage 3 unspecified: Secondary | ICD-10-CM

## 2017-12-05 DIAGNOSIS — Z79891 Long term (current) use of opiate analgesic: Secondary | ICD-10-CM | POA: Insufficient documentation

## 2017-12-05 DIAGNOSIS — I6930 Unspecified sequelae of cerebral infarction: Secondary | ICD-10-CM

## 2017-12-05 DIAGNOSIS — I69354 Hemiplegia and hemiparesis following cerebral infarction affecting left non-dominant side: Secondary | ICD-10-CM | POA: Insufficient documentation

## 2017-12-05 DIAGNOSIS — R001 Bradycardia, unspecified: Secondary | ICD-10-CM | POA: Insufficient documentation

## 2017-12-05 DIAGNOSIS — Z7982 Long term (current) use of aspirin: Secondary | ICD-10-CM | POA: Diagnosis not present

## 2017-12-05 DIAGNOSIS — Z881 Allergy status to other antibiotic agents status: Secondary | ICD-10-CM | POA: Insufficient documentation

## 2017-12-05 DIAGNOSIS — H409 Unspecified glaucoma: Secondary | ICD-10-CM | POA: Insufficient documentation

## 2017-12-05 DIAGNOSIS — E7800 Pure hypercholesterolemia, unspecified: Secondary | ICD-10-CM | POA: Diagnosis not present

## 2017-12-05 DIAGNOSIS — K219 Gastro-esophageal reflux disease without esophagitis: Secondary | ICD-10-CM | POA: Insufficient documentation

## 2017-12-05 DIAGNOSIS — Z9889 Other specified postprocedural states: Secondary | ICD-10-CM | POA: Diagnosis not present

## 2017-12-05 DIAGNOSIS — Z79899 Other long term (current) drug therapy: Secondary | ICD-10-CM | POA: Diagnosis not present

## 2017-12-05 DIAGNOSIS — Z91013 Allergy to seafood: Secondary | ICD-10-CM | POA: Insufficient documentation

## 2017-12-05 MED ORDER — AMLODIPINE BESYLATE 10 MG PO TABS: 10.0000 mg | ORAL_TABLET | Freq: Every morning | ORAL | 3 refills | Status: DC

## 2017-12-05 MED ORDER — ATORVASTATIN CALCIUM 40 MG PO TABS: 40.0000 mg | ORAL_TABLET | Freq: Every day | ORAL | 3 refills | Status: DC

## 2017-12-05 MED ORDER — HYDRALAZINE HCL 50 MG PO TABS: 50.0000 mg | ORAL_TABLET | Freq: Three times a day (TID) | ORAL | 3 refills | Status: DC

## 2017-12-05 MED ORDER — LISINOPRIL 10 MG PO TABS: 10.0000 mg | ORAL_TABLET | Freq: Every day | ORAL | 3 refills | Status: DC

## 2017-12-05 MED ORDER — DIVALPROEX SODIUM 500 MG PO DR TAB: 500.0000 mg | DELAYED_RELEASE_TABLET | Freq: Two times a day (BID) | ORAL | 3 refills | Status: DC

## 2017-12-05 MED FILL — DIVALPROEX SOD DR 500 MG TA: 500 | 30 days supply | Qty: 60 | Fill #0

## 2017-12-05 MED FILL — ATORVASTATIN 40 MG TABLET: 40 | 30 days supply | Qty: 30 | Fill #0

## 2017-12-05 MED FILL — AMLODIPINE BESYLATE 10 MG T: 10 | 30 days supply | Qty: 30 | Fill #0

## 2017-12-05 MED FILL — LISINOPRIL 10 MG TABS: 10 | 30 days supply | Qty: 30 | Fill #2

## 2017-12-05 NOTE — Patient Instructions (Signed)
Food Basics for Chronic Kidney Disease When your kidneys are not working well, they cannot remove waste and excess substances from your blood as effectively as they did before. This can lead to a buildup and imbalance of these substances, which can worsen kidney damage and affect how your body functions. Certain foods lead to a buildup of these substances in the body. By changing your diet as recommended by your diet and nutrition specialist (dietitian) or health care provider, you could help prevent further kidney damage and delay or prevent the need for dialysis. What are tips for following this plan? General instructions  Work with your health care provider and dietitian to develop a meal plan that is right for you. Foods you can eat, limit, or avoid will be different for each person depending on the stage of kidney disease and any other existing health conditions.  Talk with your health care provider about whether you should take a vitamin and mineral supplement.  Use standard measuring cups and spoons to measure servings of foods. Use a kitchen scale to measure portions of protein foods.  If directed by your health care provider, avoid drinking too much fluid. Measure and count all liquids, including water, ice, soups, flavored gelatin, and frozen desserts such as popsicles or ice cream. Reading food labels  Check the amount of sodium in foods. Choose foods that have less than 300 milligrams (mg) per serving.  Check the ingredient list for phosphorus or potassium-based additives or preservatives.  Check the amount of saturated and trans fat. Limit or avoid these fats as told by your dietitian. Shopping  Avoid buying foods that are: ? Processed, frozen, or prepackaged. ? Calcium-enriched or fortified.  Do not buy foods that have salt or sodium listed among the first five ingredients.  Do not buy canned vegetables. Cooking  Replace animal proteins, such as meat, fish, eggs, or dairy,  with plant proteins from beans, nuts, and soy. ? Use soy milk instead of cow's milk. ? Add beans or tofu to soups, casseroles, or pasta dishes instead of meat.  Soak vegetables, such as potatoes, before cooking to reduce potassium. To do this: ? Peel and cut into small pieces. ? Soak in warm water for at least 2 hours. For every 1 cup of vegetables, use 10 cups of water. ? Drain and rinse with warm water. ? Boil for at least 5 minutes. Meal planning  Limit the amount of protein from plant and animal sources you eat each day.  Do not add salt to food when cooking or before eating.  Eat meals and snacks at around the same time each day. If you have diabetes:  If you have diabetes (diabetes mellitus) and chronic kidney disease, it is important to keep your blood glucose in the target range recommended by your health care provider. Follow your diabetes management plan. This may include: ? Checking your blood glucose regularly. ? Taking oral medicines, insulin, or both. ? Exercising for at least 30 minutes on 5 or more days each week, or as told by your health care provider. ? Tracking how many servings of carbohydrates you eat at each meal.  You may be given specific guidelines on how much of certain foods and nutrients you may eat, depending on your stage of kidney disease and whether you have high blood pressure (hypertension). Follow your meal plan as told by your dietitian. What nutrients should be limited? The items listed are not a complete list. Talk with your dietitian about  what dietary choices are best for you. Potassium Potassium affects how steadily your heart beats. If too much potassium builds up in your blood, it can cause an irregular heartbeat or even a heart attack. You may need to eat less potassium, depending on your blood potassium levels and the stage of kidney disease. Talk to your dietitian about how much potassium you may have each day. You may need to limit or  avoid foods that are high in potassium, such as:  Milk and soy milk.  Fruits, such as bananas, papaya, apricots, nectarines, melon, prunes, raisins, kiwi, and oranges.  Vegetables, such as potatoes, sweet potatoes, yams, tomatoes, leafy greens, beets, okra, avocado, pumpkin, and winter squash.  White and lima beans.  Phosphorus Phosphorus is a mineral found in your bones. A balance between calcium and phosphorous is needed to build and maintain healthy bones. Too much phosphorus pulls calcium from your bones. This can make your bones weak and more likely to break. Too much phosphorus can also make your skin itch. You may need to eat less phosphorus depending on your blood phosphorus levels and the stage of kidney disease. Talk to your dietitian about how much potassium you may have each day. You may need to take medicine to lower your blood phosphorus levels if diet changes do not help. You may need to limit or avoid foods that are high in phosphorus, such as:  Milk and dairy products.  Dried beans and peas.  Tofu, soy milk, and other soy-based meat replacements.  Colas.  Nuts and peanut butter.  Meat, poultry, and fish.  Bran cereals and oatmeals.  Protein Protein helps you to make and keep muscle. It also helps in the repair of your body's cells and tissues. One of the natural breakdown products of protein is a waste product called urea. When your kidneys are not working properly, they cannot remove wastes, such as urea, like they did before you developed chronic kidney disease. Reducing how much protein you eat can help prevent a buildup of urea in your blood. Depending on your stage of kidney disease, you may need to limit foods that are high in protein. Sources of animal protein include:  Meat (all types).  Fish and seafood.  Poultry.  Eggs.  Dairy.  Other protein foods include:  Beans and legumes.  Nuts and nut butter.  Soy and tofu.  Sodium Sodium, which is  found in salt, helps maintain a healthy balance of fluids in your body. Too much sodium can increase your blood pressure and have a negative effect on the function of your heart and lungs. Too much sodium can also cause your body to retain too much fluid, making your kidneys work harder. Most people should have less than 2,300 milligrams (mg) of sodium each day. If you have hypertension, you may need to limit your sodium to 1,500 mg each day. Talk to your dietitian about how much sodium you may have each day. You may need to limit or avoid foods that are high in sodium, such as:  Salt seasonings.  Soy sauce.  Cured and processed meats.  Salted crackers and snack foods.  Fast food.  Canned soups and most canned foods.  Pickled foods.  Vegetable juice.  Boxed mixes or ready-to-eat boxed meals and side dishes.  Bottled dressings, sauces, and marinades.  Summary  Chronic kidney disease can lead to a buildup and imbalance of waste and excess substances in the body. Certain foods lead to a buildup of  as:  · Salt seasonings.  · Soy sauce.  · Cured and processed meats.  · Salted crackers and snack foods.  · Fast food.  · Canned soups and most canned foods.  · Pickled foods.  · Vegetable juice.  · Boxed mixes or ready-to-eat boxed meals and side dishes.  · Bottled dressings, sauces, and marinades.    Summary  · Chronic kidney disease can lead to a buildup and imbalance of waste and excess substances in the body. Certain foods lead to a buildup of these substances. By adjusting your intake of these foods, you could help prevent more kidney damage and delay or prevent the need for dialysis.  · Food adjustments are different for each person with chronic kidney disease. Work with a dietitian to set up nutrient goals and a meal plan that is right for you.  · If you have diabetes and chronic kidney disease, it is important to keep your blood glucose in the target range recommended by your health care provider.  This information is not intended to replace advice given to you by your health care provider. Make sure you discuss any questions you have with your health care provider.  Document Released: 12/02/2002 Document Revised: 09/06/2016 Document Reviewed: 09/06/2016  Elsevier Interactive Patient Education © 2018 [employer]

## 2017-12-06 LAB — BASIC METABOLIC PANEL
BUN/Creatinine Ratio: 16 (ref 10–24)
BUN: 29 mg/dL — ABNORMAL HIGH (ref 8–27)
CO2: 24 mmol/L (ref 20–29)
CREATININE: 1.86 mg/dL — AB (ref 0.76–1.27)
Calcium: 10.4 mg/dL — ABNORMAL HIGH (ref 8.6–10.2)
Chloride: 105 mmol/L (ref 96–106)
GFR, EST AFRICAN AMERICAN: 43 mL/min/{1.73_m2} — AB (ref >59)
GFR, EST NON AFRICAN AMERICAN: 37 mL/min/{1.73_m2} — AB (ref >59)
Glucose: 93 mg/dL (ref 65–99)
POTASSIUM: 5.2 mmol/L (ref 3.5–5.2)
SODIUM: 140 mmol/L (ref 134–144)

## 2017-12-06 NOTE — Progress Notes (Signed)
Subjective:  Patient ID: Darrell [name], male    DOB: [birth year]  Age: 67 y.o. MRN: [id number]  CC: Hypertension   HPI Darrell [name]  is a 67 year old male with a history of hypertension, previous CVA with residual left-sided weakness, new onset seizure in the setting of prior CVA, bradycardia (status post pacemaker) stage III CKD who presents today for a follow-up visit.  He took a fall 3 days ago and sustained in left carpet burn on his left cheek which he has applied antibiotic ointment to her and is improving at this time. He complains of sticking of his left fourth finger especially when he tries to extend it from a flexed position but denies the presence of pain. Tolerating his antihypertensive and denies adverse effects.  He endorses compliance with his medications and tolerates them without any adverse effects. He has been compliant with his Depakote and has not had any recent seizures and has an upcoming appointment with neurology- Dr Stark Klein   Past Medical History:  Diagnosis Date  . Arthritis   . At high risk for falls 08/16/2015  . Bradycardia   . Chronic kidney disease    stage 3 GFR 30-59 ml/min   . CIDP (chronic inflammatory demyelinating polyneuropathy) ([place])   . CKD (chronic kidney disease) stage 3, GFR 30-59 ml/min ([name]) 08/16/2015  . Dysphagia as late effect of cerebrovascular disease    pts wife states pt has to eat soft foods   . Elevated liver enzymes 08/10/2016  . GERD (gastroesophageal reflux disease)   . Glaucoma   . High cholesterol   . History of CVA with residual deficit 03/25/2013  . Hypertension   . Hypertensive retinopathy of both eyes 01/16/2017  . Inguinal hernia 03/25/2013  . Liver hemangioma 08/14/2016  . Neuromuscular disorder ([place])    chronic inflammatory demyelinating polyneuropathy   . New onset seizure ([place]) 07/08/2017  . Nuclear sclerosis of both eyes 10/25/2016  . Presence of permanent cardiac pacemaker    placed in april 2018  .  Primary open angle glaucoma of both eyes, indeterminate stage 10/25/2016  . Renal mass, right 08/14/2016  . Status cardiac pacemaker 01/29/2017   Placed for second degree heart block on 01/16/17 Medtronic Azure XT DR MRI SureScan dual-chamber pacemaker  . Stroke [facility])    2011 with residual deficit left sided weakness  . Tobacco dependence     Past Surgical History:  Procedure Laterality Date  . EYE SURGERY    . HERNIA REPAIR    . INGUINAL HERNIA REPAIR Right 11/23/2014   Procedure: right inguinal hernia repair with mesh;  Surgeon: Armandina Gemma, MD;  Location: [facility];  Service: General;  Laterality: Right;  . INSERTION OF MESH N/A 11/23/2014   Procedure: INSERTION OF MESH;  Surgeon: Armandina Gemma, MD;  Location: [facility];  Service: General;  Laterality: N/A;  . MASS EXCISION Left 08/29/2017   Procedure: EXCISION OF LEFT NECK MASS;  Surgeon: Coralie Keens, MD;  Location: [place];  Service: General;  Laterality: Left;  . PACEMAKER IMPLANT N/A 01/16/2017   Procedure: Pacemaker Implant;  Surgeon: Will Meredith Leeds, MD;  Location: [facility];  Service: Cardiovascular;  Laterality: N/A;  . SHOULDER SURGERY Bilateral 1988, 1998    Allergies  Allergen Reactions  . Doxycycline Other (See Comments)    Hiccups, cough, nausea and emesis, elevated liver enzymes, elevated eosinophils, SOB concerning for early DRESS syndrome   . Atacand Hct [Candesartan Cilexetil-Hctz] Hives  . Shellfish Allergy Hives  Outpatient Medications Prior to Visit  Medication Sig Dispense Refill  . aspirin EC 81 MG tablet Take 1 tablet (81 mg total) by mouth daily. 90 tablet 3  . brimonidine (ALPHAGAN P) 0.1 % SOLN Place 1 drop into both eyes daily.     . dorzolamide-timolol (COSOPT) 22.3-6.8 MG/ML ophthalmic solution Place 1 drop into both eyes daily.     . fluticasone (FLONASE) 50 MCG/ACT nasal spray Place 2 sprays into both nostrils daily. 16 g 6  . folic acid (FOLVITE) 1 MG tablet Take 1 tablet (1 mg total) by  mouth daily. 90 tablet 1  . latanoprost (XALATAN) 0.005 % ophthalmic solution Place 1 drop into both eyes at bedtime.    . Multiple Vitamins-Iron (DAILY VITAMIN FORMULA+IRON) TABS Take 1 tablet by mouth daily.    . polyethylene glycol (MIRALAX / GLYCOLAX) packet Take 17 g by mouth daily as needed for mild constipation.    Orlie Dakin Sodium (SENNA S PO) Take 1 tablet by mouth daily as needed (constipation).     . terbinafine (LAMISIL AT) 1 % cream Apply 1 application topically 2 (two) times daily. To R foot (Patient taking differently: Apply 1 application topically daily as needed (rash). ) 30 g 0  . torsemide (DEMADEX) 20 MG tablet Take 20 mg by mouth daily.    Marland Kitchen amLODipine (NORVASC) 10 MG tablet Take 1 tablet (10 mg total) by mouth every morning. 90 tablet 0  . atorvastatin (LIPITOR) 40 MG tablet TAKE 1 TABLET BY MOUTH DAILY. 90 tablet 0  . divalproex (DEPAKOTE) 500 MG DR tablet Take 1 tablet (500 mg total) by mouth 2 (two) times daily. 60 tablet 1  . hydrALAZINE (APRESOLINE) 50 MG tablet Take 1 tablet (50 mg total) by mouth 3 (three) times daily. 270 tablet 0  . lisinopril (PRINIVIL,ZESTRIL) 10 MG tablet Take 1 tablet (10 mg total) by mouth daily. 30 tablet 3  . acetaminophen (TYLENOL) 325 MG tablet Take 325 mg by mouth every 6 (six) hours as needed for mild pain or headache. Take 2 tablets every 6 hours as needed for fever greater than 101.     . ketoconazole (NIZORAL) 2 % cream Apply 1 application topically daily as needed for irritation.    . metoprolol tartrate (LOPRESSOR) 25 MG tablet Take 1 tablet (25 mg total) by mouth 2 (two) times daily. 180 tablet 3  . thiamine 100 MG tablet Take 1 tablet (100 mg total) by mouth daily. (Patient not taking: Reported on 09/07/2017) 30 tablet 0  . traMADol (ULTRAM) 50 MG tablet Take 1 tablet (50 mg total) by mouth every 6 (six) hours as needed for moderate pain. (Patient not taking: Reported on 09/07/2017) 15 tablet 0  . ciprofloxacin (CIPRO) 500  MG tablet Take 1 tablet (500 mg total) by mouth 2 (two) times daily. (Patient not taking: Reported on 12/05/2017) 20 tablet 0   No facility-administered medications prior to visit.     ROS Review of Systems  Constitutional: Negative for activity change and appetite change.  HENT: Negative for sinus pressure and sore throat.   Eyes: Negative for visual disturbance.  Respiratory: Negative for cough, chest tightness and shortness of breath.   Cardiovascular: Negative for chest pain and leg swelling.  Gastrointestinal: Negative for abdominal distention, abdominal pain, constipation and diarrhea.  Endocrine: Negative.   Genitourinary: Negative for dysuria.  Musculoskeletal:       See hpi  Skin: Positive for rash.  Allergic/Immunologic: Negative.   Neurological: Negative for weakness, light-headedness  and numbness.  Psychiatric/Behavioral: Negative for dysphoric mood and suicidal ideas.    Objective:  BP (!) 145/90   Pulse 60   Temp 98 F (36.7 C) (Oral)   Ht 6\' 4"  (1.93 m)   Wt 180 lb (81.6 kg)   SpO2 99%   BMI 21.91 kg/m   BP/Weight 12/05/2017 09/13/2017 78/46/9629  Systolic BP 528 413 244  Diastolic BP 90 77 81  Wt. (Lbs) 180 167 167.8  BMI 21.91 20.33 20.43      Physical Exam  Constitutional: He is oriented to person, place, and time. He appears well-developed and well-nourished.  Cardiovascular: Normal rate, normal heart sounds and intact distal pulses.  No murmur heard. Pulmonary/Chest: Effort normal and breath sounds normal. He has no wheezes. He has no rales. He exhibits no tenderness.  Abdominal: Soft. Bowel sounds are normal. He exhibits no distension and no mass. There is no tenderness.  Musculoskeletal: Normal range of motion.  Left fourth trigger finger  Neurological: He is alert and oriented to person, place, and time.  Left upper and left lower extremity reduced motor strength  Skin: Skin is warm and dry.  Friction burn on left temporal region    Psychiatric: He has a normal mood and affect.     Assessment & Plan:   1. History of CVA with residual deficit With slight left-sided weakness Abuse factor modification - atorvastatin (LIPITOR) 40 MG tablet; Take 1 tablet (40 mg total) by mouth daily.  Dispense: 30 tablet; Refill: 3  2. Essential hypertension Controlled Low-sodium, DASH diet - Basic Metabolic Panel - amLODipine (NORVASC) 10 MG tablet; Take 1 tablet (10 mg total) by mouth every morning.  Dispense: 30 tablet; Refill: 3 - lisinopril (PRINIVIL,ZESTRIL) 10 MG tablet; Take 1 tablet (10 mg total) by mouth daily.  Dispense: 30 tablet; Refill: 3 - hydrALAZINE (APRESOLINE) 50 MG tablet; Take 1 tablet (50 mg total) by mouth 3 (three) times daily.  Dispense: 270 tablet; Refill: 3  3. Seizures ([name]) Stable - divalproex (DEPAKOTE) 500 MG DR tablet; Take 1 tablet (500 mg total) by mouth 2 (two) times daily.  Dispense: 60 tablet; Refill: 3  4. Status cardiac pacemaker Stable  5. Trigger ring finger of left hand We have discussed management options including corticosteroid injections which he is supposed to  6. CKD (chronic kidney disease) stage 3, GFR 30-59 ml/min ([name]) Likely hypertensive nephropathy We will check renal function Avoid nephrotoxins   Meds ordered this encounter  Medications  . atorvastatin (LIPITOR) 40 MG tablet    Sig: Take 1 tablet (40 mg total) by mouth daily.    Dispense:  30 tablet    Refill:  3  . amLODipine (NORVASC) 10 MG tablet    Sig: Take 1 tablet (10 mg total) by mouth every morning.    Dispense:  30 tablet    Refill:  3  . lisinopril (PRINIVIL,ZESTRIL) 10 MG tablet    Sig: Take 1 tablet (10 mg total) by mouth daily.    Dispense:  30 tablet    Refill:  3  . hydrALAZINE (APRESOLINE) 50 MG tablet    Sig: Take 1 tablet (50 mg total) by mouth 3 (three) times daily.    Dispense:  270 tablet    Refill:  3  . divalproex (DEPAKOTE) 500 MG DR tablet    Sig: Take 1 tablet (500 mg total) by  mouth 2 (two) times daily.    Dispense:  60 tablet    Refill:  3  Follow-up: Return for follow up of chronic medical conditions.   [name] MD

## 2017-12-10 ENCOUNTER — Ambulatory Visit: Payer: Medicare PPO | Admitting: Family Medicine

## 2017-12-21 ENCOUNTER — Ambulatory Visit (INDEPENDENT_AMBULATORY_CARE_PROVIDER_SITE_OTHER): Payer: Medicare HMO | Admitting: *Deleted

## 2017-12-21 DIAGNOSIS — I441 Atrioventricular block, second degree: Secondary | ICD-10-CM

## 2017-12-24 ENCOUNTER — Encounter: Payer: Self-pay | Admitting: Cardiology

## 2017-12-24 NOTE — Progress Notes (Signed)
Remote pacemaker transmission.   

## 2017-12-26 DIAGNOSIS — N183 Chronic kidney disease, stage 3 (moderate): Secondary | ICD-10-CM | POA: Diagnosis not present

## 2017-12-26 DIAGNOSIS — I129 Hypertensive chronic kidney disease with stage 1 through stage 4 chronic kidney disease, or unspecified chronic kidney disease: Secondary | ICD-10-CM | POA: Diagnosis not present

## 2017-12-31 ENCOUNTER — Telehealth: Payer: Self-pay | Admitting: Family Medicine

## 2017-12-31 NOTE — Telephone Encounter (Signed)
4 page, paperwork recievd through fax 12-31-17.

## 2018-01-08 LAB — CUP PACEART REMOTE DEVICE CHECK
Battery Remaining Longevity: 127 mo
Battery Voltage: 3.04 V
Brady Statistic AP VS Percent: 0.83 %
Brady Statistic AS VS Percent: 0.45 %
Date Time Interrogation Session: 20190329173046
Implantable Lead Implant Date: 20180424
Implantable Lead Location: 753859
Implantable Pulse Generator Implant Date: 20180424
Lead Channel Impedance Value: 418 Ohm
Lead Channel Pacing Threshold Amplitude: 0.5 V
Lead Channel Pacing Threshold Amplitude: 0.75 V
Lead Channel Pacing Threshold Pulse Width: 0.4 ms
Lead Channel Sensing Intrinsic Amplitude: 1.625 mV
Lead Channel Sensing Intrinsic Amplitude: 1.625 mV
Lead Channel Sensing Intrinsic Amplitude: 13.875 mV
Lead Channel Sensing Intrinsic Amplitude: 13.875 mV
Lead Channel Setting Pacing Amplitude: 2.5 V
MDC IDC LEAD IMPLANT DT: 20180424
MDC IDC LEAD LOCATION: 753860
MDC IDC MSMT LEADCHNL RA IMPEDANCE VALUE: 304 Ohm
MDC IDC MSMT LEADCHNL RA PACING THRESHOLD PULSEWIDTH: 0.4 ms
MDC IDC MSMT LEADCHNL RV IMPEDANCE VALUE: 380 Ohm
MDC IDC MSMT LEADCHNL RV IMPEDANCE VALUE: 456 Ohm
MDC IDC SET LEADCHNL RA PACING AMPLITUDE: 1.5 V
MDC IDC SET LEADCHNL RV PACING PULSEWIDTH: 0.4 ms
MDC IDC SET LEADCHNL RV SENSING SENSITIVITY: 2 mV
MDC IDC STAT BRADY AP VP PERCENT: 12.17 %
MDC IDC STAT BRADY AS VP PERCENT: 86.55 %
MDC IDC STAT BRADY RA PERCENT PACED: 13.07 %
MDC IDC STAT BRADY RV PERCENT PACED: 98.71 %

## 2018-01-15 ENCOUNTER — Ambulatory Visit (INDEPENDENT_AMBULATORY_CARE_PROVIDER_SITE_OTHER): Payer: Medicare HMO | Admitting: Cardiology

## 2018-01-15 ENCOUNTER — Encounter: Payer: Self-pay | Admitting: Cardiology

## 2018-01-15 VITALS — BP 140/78 | HR 71 | Ht 76.0 in | Wt 172.0 lb

## 2018-01-15 DIAGNOSIS — I10 Essential (primary) hypertension: Secondary | ICD-10-CM | POA: Diagnosis not present

## 2018-01-15 DIAGNOSIS — R001 Bradycardia, unspecified: Secondary | ICD-10-CM

## 2018-01-15 DIAGNOSIS — I441 Atrioventricular block, second degree: Secondary | ICD-10-CM | POA: Diagnosis not present

## 2018-01-15 LAB — CUP PACEART INCLINIC DEVICE CHECK
Battery Remaining Longevity: 126 mo
Brady Statistic RA Percent Paced: 19.7 %
Brady Statistic RV Percent Paced: 98.6 %
Date Time Interrogation Session: 20190423140626
Implantable Lead Implant Date: 20180424
Implantable Lead Location: 753859
Implantable Lead Model: 5076
Lead Channel Impedance Value: 399 Ohm
Lead Channel Pacing Threshold Amplitude: 0.75 V
Lead Channel Pacing Threshold Pulse Width: 0.4 ms
Lead Channel Sensing Intrinsic Amplitude: 1.3 mV
Lead Channel Setting Pacing Amplitude: 2.5 V
Lead Channel Setting Pacing Pulse Width: 0.4 ms
MDC IDC LEAD IMPLANT DT: 20180424
MDC IDC LEAD LOCATION: 753860
MDC IDC MSMT LEADCHNL RV IMPEDANCE VALUE: 456 Ohm
MDC IDC MSMT LEADCHNL RV PACING THRESHOLD AMPLITUDE: 0.5 V
MDC IDC MSMT LEADCHNL RV PACING THRESHOLD PULSEWIDTH: 0.4 ms
MDC IDC MSMT LEADCHNL RV SENSING INTR AMPL: 10 mV
MDC IDC PG IMPLANT DT: 20180424
MDC IDC SET LEADCHNL RA PACING AMPLITUDE: 1.5 V
MDC IDC SET LEADCHNL RV SENSING SENSITIVITY: 2 mV

## 2018-01-15 MED ORDER — METOPROLOL TARTRATE 50 MG PO TABS: 50.0000 mg | ORAL_TABLET | Freq: Two times a day (BID) | ORAL | 3 refills | Status: DC

## 2018-01-15 NOTE — Patient Instructions (Addendum)
Medication Instructions:  Your physician has recommended you make the following change in your medication:  1. INCREASE Metoprolol Tartrate (Lopressor) to 50 mg twice daily  (new prescription is for 50 mg tablets)  *If you need a refill on your cardiac medications before your next appointment, please call your pharmacy*  Labwork: None ordered  Testing/Procedures: None ordered  Follow-Up: Remote monitoring is used to monitor your Pacemaker or ICD from home. This monitoring reduces the number of office visits required to check your device to one time per year. It allows [place] to keep an eye on the functioning of your device to ensure it is working properly. You are scheduled for a device check from home on 03/25/2018. You may send your transmission at any time that day. If you have a wireless device, the transmission will be sent automatically. After your physician reviews your transmission, you will receive a postcard with your next transmission date.  Your physician wants you to follow-up in: 1 year with Dr. Curt Bears.  You will receive a reminder letter in the mail two months in advance. If you don't receive a letter, please call our office to schedule the follow-up appointment.  Thank you for choosing [facility]!!   Trinidad Curet, RN [phone]

## 2018-01-15 NOTE — Progress Notes (Signed)
Electrophysiology Office Note   Date:  01/15/2018   ID:  LUISFELIPE ENGELSTAD, DOB [birth year], MRN [id number]  PCP:  [name], MD  Cardiologist:  none Primary Electrophysiologist:  Rodneisha Bonnet Meredith Leeds, MD    Chief Complaint  Patient presents with  . Pacemaker Check    Second degre AV block     History of Present Illness: Darrell [name] is a 67 y.o. male who is being seen today for the evaluation of heart block at the request of [name], MD. Presenting today for electrophysiology evaluation. Presented to the hospital when he discovered his HR was in the 40s, found to be in heart block with 2:1 conduction. Medtronic dual chamber pacemaker implanted 01/16/17..     Today, he denies symptoms of palpitations, chest pain, shortness of breath, orthopnea, PND, lower extremity edema, claudication, dizziness, presyncope, syncope, bleeding, or neurologic sequela. The patient is tolerating medications without difficulties. He is been going to physical therapy over the last few weeks. He has been working mainly on standing and walking. He feels like he is getting quite a bit stronger.  Past Medical History:  Diagnosis Date  . Arthritis   . At high risk for falls 08/16/2015  . Bradycardia   . Chronic kidney disease    stage 3 GFR 30-59 ml/min   . CIDP (chronic inflammatory demyelinating polyneuropathy) ([place])   . CKD (chronic kidney disease) stage 3, GFR 30-59 ml/min ([name]) 08/16/2015  . Dysphagia as late effect of cerebrovascular disease    pts wife states pt has to eat soft foods   . Elevated liver enzymes 08/10/2016  . GERD (gastroesophageal reflux disease)   . Glaucoma   . High cholesterol   . History of CVA with residual deficit 03/25/2013  . Hypertension   . Hypertensive retinopathy of both eyes 01/16/2017  . Inguinal hernia 03/25/2013  . Liver hemangioma 08/14/2016  . Neuromuscular disorder ([place])    chronic inflammatory demyelinating polyneuropathy   . New onset seizure ([place])  07/08/2017  . Nuclear sclerosis of both eyes 10/25/2016  . Presence of permanent cardiac pacemaker    placed in april 2018  . Primary open angle glaucoma of both eyes, indeterminate stage 10/25/2016  . Renal mass, right 08/14/2016  . Status cardiac pacemaker 01/29/2017   Placed for second degree heart block on 01/16/17 Medtronic Azure XT DR MRI SureScan dual-chamber pacemaker  . Stroke [facility])    2011 with residual deficit left sided weakness  . Tobacco dependence    Past Surgical History:  Procedure Laterality Date  . EYE SURGERY    . HERNIA REPAIR    . INGUINAL HERNIA REPAIR Right 11/23/2014   Procedure: right inguinal hernia repair with mesh;  Surgeon: Armandina Gemma, MD;  Location: [facility];  Service: General;  Laterality: Right;  . INSERTION OF MESH N/A 11/23/2014   Procedure: INSERTION OF MESH;  Surgeon: Armandina Gemma, MD;  Location: [facility];  Service: General;  Laterality: N/A;  . MASS EXCISION Left 08/29/2017   Procedure: EXCISION OF LEFT NECK MASS;  Surgeon: Coralie Keens, MD;  Location: [facility];  Service: General;  Laterality: Left;  . PACEMAKER IMPLANT N/A 01/16/2017   Procedure: Pacemaker Implant;  Surgeon: Mariyana Fulop Meredith Leeds, MD;  Location: [place] [facility];  Service: Cardiovascular;  Laterality: N/A;  . SHOULDER SURGERY Bilateral 1988, 1998     Current Outpatient Medications  Medication Sig Dispense Refill  . acetaminophen (TYLENOL) 325 MG tablet Take 325 mg by mouth every 6 (six)  hours as needed for mild pain or headache. Take 2 tablets every 6 hours as needed for fever greater than 101.     Marland Kitchen amLODipine (NORVASC) 10 MG tablet Take 1 tablet (10 mg total) by mouth every morning. 30 tablet 3  . aspirin EC 81 MG tablet Take 1 tablet (81 mg total) by mouth daily. 90 tablet 3  . atorvastatin (LIPITOR) 40 MG tablet Take 1 tablet (40 mg total) by mouth daily. 30 tablet 3  . brimonidine (ALPHAGAN P) 0.1 % SOLN Place 1 drop into both eyes daily.     . divalproex (DEPAKOTE) 500 MG DR tablet  Take 1 tablet (500 mg total) by mouth 2 (two) times daily. 60 tablet 3  . dorzolamide-timolol (COSOPT) 22.3-6.8 MG/ML ophthalmic solution Place 1 drop into both eyes daily.     . fluticasone (FLONASE) 50 MCG/ACT nasal spray Place 2 sprays into both nostrils daily. 16 g 6  . folic acid (FOLVITE) 1 MG tablet Take 1 tablet (1 mg total) by mouth daily. 90 tablet 1  . hydrALAZINE (APRESOLINE) 50 MG tablet Take 1 tablet (50 mg total) by mouth 3 (three) times daily. 270 tablet 3  . ketoconazole (NIZORAL) 2 % cream Apply 1 application topically daily as needed for irritation.    Marland Kitchen latanoprost (XALATAN) 0.005 % ophthalmic solution Place 1 drop into both eyes at bedtime.    Marland Kitchen lisinopril (PRINIVIL,ZESTRIL) 10 MG tablet Take 1 tablet (10 mg total) by mouth daily. 30 tablet 3  . Multiple Vitamins-Iron (DAILY VITAMIN FORMULA+IRON) TABS Take 1 tablet by mouth daily.    . polyethylene glycol (MIRALAX / GLYCOLAX) packet Take 17 g by mouth daily as needed for mild constipation.    Orlie Dakin Sodium (SENNA S PO) Take 1 tablet by mouth daily as needed (constipation).     . terbinafine (LAMISIL AT) 1 % cream Apply 1 application topically 2 (two) times daily. To R foot (Patient taking differently: Apply 1 application topically daily as needed (rash). ) 30 g 0  . thiamine 100 MG tablet Take 1 tablet (100 mg total) by mouth daily. 30 tablet 0  . torsemide (DEMADEX) 20 MG tablet Take 20 mg by mouth daily.    . traMADol (ULTRAM) 50 MG tablet Take 1 tablet (50 mg total) by mouth every 6 (six) hours as needed for moderate pain. 15 tablet 0  . metoprolol tartrate (LOPRESSOR) 50 MG tablet Take 1 tablet (50 mg total) by mouth 2 (two) times daily. 180 tablet 3   No current facility-administered medications for this visit.     Allergies:   Doxycycline; Atacand hct [candesartan cilexetil-hctz]; and Shellfish allergy   Social History:  The patient  reports that he has been smoking cigarettes.  He has a 40.00 pack-year  smoking history. He has never used smokeless tobacco. He reports that he does not drink alcohol or use drugs.   Family History:  The patient's family history includes Diabetes in his sister; Hypertension in his mother and sister.    ROS:  Please see the history of present illness.   Otherwise, review of systems is positive for none.   All other systems are reviewed and negative.    PHYSICAL EXAM: VS:  BP 140/78   Pulse 71   Ht 6\' 4"  (1.93 m)   Wt 172 lb (78 kg)   SpO2 98%   BMI 20.94 kg/m  , BMI Body mass index is 20.94 kg/m. GEN: Well nourished, well developed, in no acute distress  HEENT: normal  Neck: no JVD, carotid bruits, or masses Cardiac: RRR; no murmurs, rubs, or gallops,no edema  Respiratory:  clear to auscultation bilaterally, normal work of breathing GI: soft, nontender, nondistended, + BS MS: no deformity or atrophy  Skin: warm and dry, device pocket is well healed Neuro:  Strength and sensation are intact Psych: euthymic mood, full affect  EKG:  EKG is ordered today. Personal review of the ekg ordered shows sinus rhythm, rate 75  Device interrogation is reviewed today in detail.  See PaceArt for details.   Recent Labs: 07/08/2017: TSH 0.476 07/09/2017: Magnesium 1.6 08/23/2017: Hemoglobin 12.3; Platelets 175 09/07/2017: ALT 8 12/05/2017: BUN 29; Creatinine, Ser 1.86; Potassium 5.2; Sodium 140    Lipid Panel     Component Value Date/Time   CHOL 141 08/16/2015 1555   TRIG 116 08/16/2015 1555   HDL 34 (L) 08/16/2015 1555   CHOLHDL 4.1 08/16/2015 1555   VLDL 23 08/16/2015 1555   LDLCALC 84 08/16/2015 1555     Wt Readings from Last 3 Encounters:  01/15/18 172 lb (78 kg)  12/05/17 180 lb (81.6 kg)  09/13/17 167 lb (75.8 kg)      Other studies Reviewed: Additional studies/ records that were reviewed today include: TTE 2014  Review of the above records today demonstrates:  - Left ventricle: The cavity size was normal. There was severe concentric  and mild asymmetric hypertrophy. Systolic function was normal. Wall motion was normal; there were no regional wall motion abnormalities. Doppler parameters are consistent with abnormal left ventricular relaxation (grade 1 diastolic dysfunction). - Aortic root: The aortic root was mildly dilated. - Right atrium: The atrium was mildly dilated. - Pulmonary arteries: Systolic pressure was mildly increased. PA peak pressure: 62mm Hg (S).  ASSESSMENT AND PLAN:  1.  2:1 AV block: s/p Medtronic pacemaker. Device is functioning appropriately today. He is in sinus rhythm today. Not requiring the pacing at this time. Changes made to the device for long-term management. He did have a one-to-one tachycardia as well as an 8 beat run of ventricular tachycardia on his device. We'll plan for metoprolol 25 mg.  2. Hypertension: Blood pressure well controlled today. No changes to medications at this time.  Current medicines are reviewed at length with the patient today.   The patient does not have concerns regarding his medicines.  The following changes were made today:  Metoprolol  Labs/ tests ordered today include:  No orders of the defined types were placed in this encounter.    Disposition:   FU with Nerissa Constantin 9 months  Signed, Quin Mcpherson Meredith Leeds, MD  01/15/2018 12:05 PM     [place] [address] [phone] (office) [phone] (fax)    Electrophysiology Office Note   Date:  01/15/2018   ID:  Rasmus, Preusser 1951/06/18, MRN [id number]  PCP:  [name], MD  Cardiologist:  none Primary Electrophysiologist:  George Haggart Meredith Leeds, MD    Chief Complaint  Patient presents with  . Pacemaker Check    Second degre AV block     History of Present Illness: Darrell [name] is a 67 y.o. male who is being seen today for the evaluation of heart block at the request of [name], MD. Presenting today for  electrophysiology evaluation. Presented to the hospital when he discovered his HR was in the 40s, found to be in heart block with 2:1 conduction. Medtronic dual chamber pacemaker implanted 01/16/17..   Today,  denies symptoms of palpitations, chest pain, shortness of breath, orthopnea, PND, lower extremity edema, claudication, dizziness, presyncope, syncope, bleeding, or neurologic sequela. The patient is tolerating medications without difficulties.  Overall he is feeling well.  His main complaint is fatigue and weakness.  He is not walking as much as he usually does.  Otherwise no major complaints.  Past Medical History:  Diagnosis Date  . Arthritis   . At high risk for falls 08/16/2015  . Bradycardia   . Chronic kidney disease    stage 3 GFR 30-59 ml/min   . CIDP (chronic inflammatory demyelinating polyneuropathy) ([place])   . CKD (chronic kidney disease) stage 3, GFR 30-59 ml/min ([name]) 08/16/2015  . Dysphagia as late effect of cerebrovascular disease    pts wife states pt has to eat soft foods   . Elevated liver enzymes 08/10/2016  . GERD (gastroesophageal reflux disease)   . Glaucoma   . High cholesterol   . History of CVA with residual deficit 03/25/2013  . Hypertension   . Hypertensive retinopathy of both eyes 01/16/2017  . Inguinal hernia 03/25/2013  . Liver hemangioma 08/14/2016  . Neuromuscular disorder ([place])    chronic inflammatory demyelinating polyneuropathy   . New onset seizure ([place]) 07/08/2017  . Nuclear sclerosis of both eyes 10/25/2016  . Presence of permanent cardiac pacemaker    placed in april 2018  . Primary open angle glaucoma of both eyes, indeterminate stage 10/25/2016  . Renal mass, right 08/14/2016  . Status cardiac pacemaker 01/29/2017   Placed for second degree heart block on 01/16/17 Medtronic Azure XT DR MRI SureScan dual-chamber pacemaker  . Stroke [facility])    2011 with residual deficit left sided weakness  . Tobacco dependence    Past Surgical History:  Procedure  Laterality Date  . EYE SURGERY    . HERNIA REPAIR    . INGUINAL HERNIA REPAIR Right 11/23/2014   Procedure: right inguinal hernia repair with mesh;  Surgeon: Armandina Gemma, MD;  [facility];  Service: General;  Laterality: Right;  . INSERTION OF MESH N/A 11/23/2014   Procedure: INSERTION OF MESH;  Surgeon: Armandina Gemma, MD;  [facility];  Service: General;  Laterality: N/A;  . MASS EXCISION Left 08/29/2017   Procedure: EXCISION OF LEFT NECK MASS;  Surgeon: Coralie Keens, MD;  Location: [place];  Service: General;  Laterality: Left;  . PACEMAKER IMPLANT N/A 01/16/2017   Procedure: Pacemaker Implant;  Surgeon: Sian Joles Meredith Leeds, MD;  Location: [place] [facility];  Service: Cardiovascular;  Laterality: N/A;  . SHOULDER SURGERY Bilateral 1988, 1998     Current Outpatient Medications  Medication Sig Dispense Refill  . acetaminophen (TYLENOL) 325 MG tablet Take 325 mg by mouth every 6 (six) hours as needed for mild pain or headache. Take 2 tablets every 6 hours as needed for fever greater than 101.     Marland Kitchen amLODipine (NORVASC) 10 MG tablet Take 1 tablet (10 mg total) by mouth every morning. 30 tablet 3  . aspirin EC 81 MG tablet Take 1 tablet (81 mg total) by mouth daily. 90 tablet 3  . atorvastatin (LIPITOR) 40 MG tablet Take 1 tablet (40 mg total) by mouth daily. 30 tablet 3  . brimonidine (ALPHAGAN P) 0.1 % SOLN Place 1 drop into both eyes daily.     . divalproex (DEPAKOTE) 500 MG DR tablet Take 1 tablet (500 mg total) by mouth 2 (two) times daily. 60 tablet 3  . dorzolamide-timolol (COSOPT) 22.3-6.8 MG/ML ophthalmic solution  Place 1 drop into both eyes daily.     . fluticasone (FLONASE) 50 MCG/ACT nasal spray Place 2 sprays into both nostrils daily. 16 g 6  . folic acid (FOLVITE) 1 MG tablet Take 1 tablet (1 mg total) by mouth daily. 90 tablet 1  . hydrALAZINE (APRESOLINE) 50 MG tablet Take 1 tablet (50 mg total) by mouth 3 (three) times daily. 270 tablet 3  . ketoconazole (NIZORAL) 2 %  cream Apply 1 application topically daily as needed for irritation.    Marland Kitchen latanoprost (XALATAN) 0.005 % ophthalmic solution Place 1 drop into both eyes at bedtime.    Marland Kitchen lisinopril (PRINIVIL,ZESTRIL) 10 MG tablet Take 1 tablet (10 mg total) by mouth daily. 30 tablet 3  . Multiple Vitamins-Iron (DAILY VITAMIN FORMULA+IRON) TABS Take 1 tablet by mouth daily.    . polyethylene glycol (MIRALAX / GLYCOLAX) packet Take 17 g by mouth daily as needed for mild constipation.    Orlie Dakin Sodium (SENNA S PO) Take 1 tablet by mouth daily as needed (constipation).     . terbinafine (LAMISIL AT) 1 % cream Apply 1 application topically 2 (two) times daily. To R foot (Patient taking differently: Apply 1 application topically daily as needed (rash). ) 30 g 0  . thiamine 100 MG tablet Take 1 tablet (100 mg total) by mouth daily. 30 tablet 0  . torsemide (DEMADEX) 20 MG tablet Take 20 mg by mouth daily.    . traMADol (ULTRAM) 50 MG tablet Take 1 tablet (50 mg total) by mouth every 6 (six) hours as needed for moderate pain. 15 tablet 0  . metoprolol tartrate (LOPRESSOR) 50 MG tablet Take 1 tablet (50 mg total) by mouth 2 (two) times daily. 180 tablet 3   No current facility-administered medications for this visit.     Allergies:   Doxycycline; Atacand hct [candesartan cilexetil-hctz]; and Shellfish allergy   Social History:  The patient  reports that he has been smoking cigarettes.  He has a 40.00 pack-year smoking history. He has never used smokeless tobacco. He reports that he does not drink alcohol or use drugs.   Family History:  The patient's family history includes Diabetes in his sister; Hypertension in his mother and sister.   ROS:  Please see the history of present illness.   Otherwise, review of systems is positive for fatigue, visual disturbance, snoring, balance problems, headaches.   All other systems are reviewed and negative.   PHYSICAL EXAM: VS:  BP 140/78   Pulse 71   Ht 6\' 4"  (1.93  m)   Wt 172 lb (78 kg)   SpO2 98%   BMI 20.94 kg/m  , BMI Body mass index is 20.94 kg/m. GEN: Well nourished, well developed, in no acute distress  HEENT: normal  Neck: no JVD, carotid bruits, or masses Cardiac: RRR; no murmurs, rubs, or gallops,no edema  Respiratory:  clear to auscultation bilaterally, normal work of breathing GI: soft, nontender, nondistended, + BS MS: no deformity or atrophy  Skin: warm and dry, device site well healed Neuro:  Strength and sensation are intact Psych: euthymic mood, full affect  EKG:  EKG is not ordered today. Personal review of the ekg ordered 07/08/17 shows SR, RBBB  Personal review of the device interrogation today. Results in [place]: 07/08/2017: TSH 0.476 07/09/2017: Magnesium 1.6 08/23/2017: Hemoglobin 12.3; Platelets 175 09/07/2017: ALT 8 12/05/2017: BUN 29; Creatinine, Ser 1.86; Potassium 5.2; Sodium 140    Lipid Panel  Component Value Date/Time   CHOL 141 08/16/2015 1555   TRIG 116 08/16/2015 1555   HDL 34 (L) 08/16/2015 1555   CHOLHDL 4.1 08/16/2015 1555   VLDL 23 08/16/2015 1555   LDLCALC 84 08/16/2015 1555     Wt Readings from Last 3 Encounters:  01/15/18 172 lb (78 kg)  12/05/17 180 lb (81.6 kg)  09/13/17 167 lb (75.8 kg)      Other studies Reviewed: Additional studies/ records that were reviewed today include: TTE 2014  Review of the above records today demonstrates:  - Left ventricle: The cavity size was normal. There was severe concentric and mild asymmetric hypertrophy. Systolic function was normal. Wall motion was normal; there were no regional wall motion abnormalities. Doppler parameters are consistent with abnormal left ventricular relaxation (grade 1 diastolic dysfunction). - Aortic root: The aortic root was mildly dilated. - Right atrium: The atrium was mildly dilated. - Pulmonary arteries: Systolic pressure was mildly increased. PA peak pressure: 66mm Hg  (S).  ASSESSMENT AND PLAN:  1.  2:1 AV block: This post Medtronic dual-chamber pacemaker.  Device functioning appropriately.  He is in sinus rhythm today.  Not pacing at this time.  He has had some short nonsustained runs of VT less than 2 seconds as well as SVT.  Janyia Guion increase metoprolol to 50 mg twice a day.  2. Hypertension: Well-controlled.  No changes.  Current medicines are reviewed at length with the patient today.   The patient does not have concerns regarding his medicines.  The following changes were made today: Increase metoprolol  Labs/ tests ordered today include:  No orders of the defined types were placed in this encounter.    Disposition:   FU with Nicky Milhouse 12 months  Signed, Evalyne Cortopassi Meredith Leeds, MD  01/15/2018 12:05 PM     [place] [address] [place] [address] [phone] (office) [phone] (fax)

## 2018-03-07 ENCOUNTER — Telehealth: Payer: Self-pay | Admitting: Cardiology

## 2018-03-07 ENCOUNTER — Encounter: Payer: Self-pay | Admitting: Family Medicine

## 2018-03-07 ENCOUNTER — Ambulatory Visit: Payer: Medicare HMO | Attending: Family Medicine | Admitting: Family Medicine

## 2018-03-07 VITALS — BP 109/72 | HR 61 | Temp 97.5°F | Ht 76.0 in | Wt 174.6 lb

## 2018-03-07 DIAGNOSIS — N183 Chronic kidney disease, stage 3 unspecified: Secondary | ICD-10-CM

## 2018-03-07 DIAGNOSIS — I10 Essential (primary) hypertension: Secondary | ICD-10-CM

## 2018-03-07 DIAGNOSIS — I6930 Unspecified sequelae of cerebral infarction: Secondary | ICD-10-CM | POA: Diagnosis not present

## 2018-03-07 DIAGNOSIS — Z7982 Long term (current) use of aspirin: Secondary | ICD-10-CM | POA: Insufficient documentation

## 2018-03-07 DIAGNOSIS — Z91013 Allergy to seafood: Secondary | ICD-10-CM | POA: Insufficient documentation

## 2018-03-07 DIAGNOSIS — I129 Hypertensive chronic kidney disease with stage 1 through stage 4 chronic kidney disease, or unspecified chronic kidney disease: Secondary | ICD-10-CM | POA: Diagnosis present

## 2018-03-07 DIAGNOSIS — R569 Unspecified convulsions: Secondary | ICD-10-CM | POA: Diagnosis not present

## 2018-03-07 DIAGNOSIS — Z79899 Other long term (current) drug therapy: Secondary | ICD-10-CM | POA: Diagnosis not present

## 2018-03-07 DIAGNOSIS — Z9889 Other specified postprocedural states: Secondary | ICD-10-CM | POA: Insufficient documentation

## 2018-03-07 DIAGNOSIS — Z881 Allergy status to other antibiotic agents status: Secondary | ICD-10-CM | POA: Diagnosis not present

## 2018-03-07 DIAGNOSIS — Z950 Presence of cardiac pacemaker: Secondary | ICD-10-CM | POA: Diagnosis not present

## 2018-03-07 MED ORDER — ATORVASTATIN CALCIUM 40 MG PO TABS: 40.0000 mg | ORAL_TABLET | Freq: Every day | ORAL | 3 refills | Status: DC

## 2018-03-07 MED ORDER — DIVALPROEX SODIUM 500 MG PO DR TAB: 500.0000 mg | DELAYED_RELEASE_TABLET | Freq: Two times a day (BID) | ORAL | 3 refills | Status: DC

## 2018-03-07 MED ORDER — HYDRALAZINE HCL 50 MG PO TABS: 50.0000 mg | ORAL_TABLET | Freq: Three times a day (TID) | ORAL | 3 refills | Status: DC

## 2018-03-07 MED ORDER — LISINOPRIL 10 MG PO TABS: 10.0000 mg | ORAL_TABLET | Freq: Every day | ORAL | 3 refills | Status: DC

## 2018-03-07 MED ORDER — POLYETHYLENE GLYCOL 3350 17 G PO PACK: 17.0000 g | PACK | Freq: Every day | ORAL | 3 refills | Status: DC | PRN

## 2018-03-07 MED ORDER — AMLODIPINE BESYLATE 10 MG PO TABS: 10.0000 mg | ORAL_TABLET | Freq: Every morning | ORAL | 3 refills | Status: DC

## 2018-03-07 NOTE — Progress Notes (Signed)
Subjective:  Patient ID: Darrell [name], male    DOB: 1950/12/09  Age: 68 y.o. MRN: [id number]  CC: Hypertension   HPI Darrell [name]  is a 67 year old male with a history of hypertension, previous CVA with residual left-sided weakness, seizure in the setting of prior CVA, second degree AV block (status post medtronic dual chamber pacemaker) stage III CKD who presents today for a follow-up visit. He is accompanied by his spouse and reports doing well. He was seen by EP Cardiologist on 01/15/18, interrogation of device revealed short runs of VT <2 secs and SVT and the dose of his metoprolol was increased with plans for follow up in 12 months He denies recent falls and is able to ambulate with a walker at home. Denies recent seizures. He has no additional concerns today.  Past Medical History:  Diagnosis Date  . Arthritis   . At high risk for falls 08/16/2015  . Bradycardia   . Chronic kidney disease    stage 3 GFR 30-59 ml/min   . CIDP (chronic inflammatory demyelinating polyneuropathy) ([place])   . CKD (chronic kidney disease) stage 3, GFR 30-59 ml/min ([name]) 08/16/2015  . Dysphagia as late effect of cerebrovascular disease    pts wife states pt has to eat soft foods   . Elevated liver enzymes 08/10/2016  . GERD (gastroesophageal reflux disease)   . Glaucoma   . High cholesterol   . History of CVA with residual deficit 03/25/2013  . Hypertension   . Hypertensive retinopathy of both eyes 01/16/2017  . Inguinal hernia 03/25/2013  . Liver hemangioma 08/14/2016  . Neuromuscular disorder ([place])    chronic inflammatory demyelinating polyneuropathy   . New onset seizure ([place]) 07/08/2017  . Nuclear sclerosis of both eyes 10/25/2016  . Presence of permanent cardiac pacemaker    placed in april 2018  . Primary open angle glaucoma of both eyes, indeterminate stage 10/25/2016  . Renal mass, right 08/14/2016  . Status cardiac pacemaker 01/29/2017   Placed for second degree heart block on 01/16/17  Medtronic Azure XT DR MRI SureScan dual-chamber pacemaker  . Stroke [facility]    2011 with residual deficit left sided weakness  . Tobacco dependence     Past Surgical History:  Procedure Laterality Date  . EYE SURGERY    . HERNIA REPAIR    . INGUINAL HERNIA REPAIR Right 11/23/2014   Procedure: right inguinal hernia repair with mesh;  Surgeon: Armandina Gemma, MD;  Location: [facility];  Service: General;  Laterality: Right;  . INSERTION OF MESH N/A 11/23/2014   Procedure: INSERTION OF MESH;  Surgeon: Armandina Gemma, MD;  Location: [facility];  Service: General;  Laterality: N/A;  . MASS EXCISION Left 08/29/2017   Procedure: EXCISION OF LEFT NECK MASS;  Surgeon: Coralie Keens, MD;  Location: [facility];  Service: General;  Laterality: Left;  . PACEMAKER IMPLANT N/A 01/16/2017   Procedure: Pacemaker Implant;  Surgeon: Will Meredith Leeds, MD;  Location: [facility];  Service: Cardiovascular;  Laterality: N/A;  . SHOULDER SURGERY Bilateral 1988, 1998    Allergies  Allergen Reactions  . Doxycycline Other (See Comments)    Hiccups, cough, nausea and emesis, elevated liver enzymes, elevated eosinophils, SOB concerning for early DRESS syndrome   . [place]and Hct [Candesartan Cilexetil-Hctz] Hives  . Shellfish Allergy Hives     Outpatient Medications Prior to Visit  Medication Sig Dispense Refill  . acetaminophen (TYLENOL) 325 MG tablet Take 325 mg by mouth every 6 (six) hours  as needed for mild pain or headache. Take 2 tablets every 6 hours as needed for fever greater than 101.     Marland Kitchen aspirin EC 81 MG tablet Take 1 tablet (81 mg total) by mouth daily. 90 tablet 3  . brimonidine (ALPHAGAN P) 0.1 % SOLN Place 1 drop into both eyes daily.     . dorzolamide-timolol (COSOPT) 22.3-6.8 MG/ML ophthalmic solution Place 1 drop into both eyes daily.     . fluticasone (FLONASE) 50 MCG/ACT nasal spray Place 2 sprays into both nostrils daily. 16 g 6  . folic acid (FOLVITE) 1 MG tablet Take 1 tablet (1 mg total) by mouth  daily. 90 tablet 1  . ketoconazole (NIZORAL) 2 % cream Apply 1 application topically daily as needed for irritation.    Marland Kitchen latanoprost (XALATAN) 0.005 % ophthalmic solution Place 1 drop into both eyes at bedtime.    . metoprolol tartrate (LOPRESSOR) 50 MG tablet Take 1 tablet (50 mg total) by mouth 2 (two) times daily. 180 tablet 3  . Multiple Vitamins-Iron (DAILY VITAMIN FORMULA+IRON) TABS Take 1 tablet by mouth daily.    Orlie Dakin Sodium (SENNA S PO) Take 1 tablet by mouth daily as needed (constipation).     . terbinafine (LAMISIL AT) 1 % cream Apply 1 application topically 2 (two) times daily. To R foot (Patient taking differently: Apply 1 application topically daily as needed (rash). ) 30 g 0  . thiamine 100 MG tablet Take 1 tablet (100 mg total) by mouth daily. 30 tablet 0  . torsemide (DEMADEX) 20 MG tablet Take 20 mg by mouth daily.    . traMADol (ULTRAM) 50 MG tablet Take 1 tablet (50 mg total) by mouth every 6 (six) hours as needed for moderate pain. 15 tablet 0  . amLODipine (NORVASC) 10 MG tablet Take 1 tablet (10 mg total) by mouth every morning. 30 tablet 3  . atorvastatin (LIPITOR) 40 MG tablet Take 1 tablet (40 mg total) by mouth daily. 30 tablet 3  . divalproex (DEPAKOTE) 500 MG DR tablet Take 1 tablet (500 mg total) by mouth 2 (two) times daily. 60 tablet 3  . hydrALAZINE (APRESOLINE) 50 MG tablet Take 1 tablet (50 mg total) by mouth 3 (three) times daily. 270 tablet 3  . lisinopril (PRINIVIL,ZESTRIL) 10 MG tablet Take 1 tablet (10 mg total) by mouth daily. 30 tablet 3  . polyethylene glycol (MIRALAX / GLYCOLAX) packet Take 17 g by mouth daily as needed for mild constipation.     No facility-administered medications prior to visit.     ROS Review of Systems  Constitutional: Negative for activity change and appetite change.  HENT: Negative for sinus pressure and sore throat.   Eyes: Negative for visual disturbance.  Respiratory: Negative for cough, chest tightness  and shortness of breath.   Cardiovascular: Negative for chest pain and leg swelling.  Gastrointestinal: Negative for abdominal distention, abdominal pain, constipation and diarrhea.  Endocrine: Negative.   Genitourinary: Negative for dysuria.  Musculoskeletal: Positive for gait problem. Negative for joint swelling and myalgias.  Skin: Negative for rash.  Allergic/Immunologic: Negative.   Neurological: Positive for weakness. Negative for light-headedness and numbness.  Psychiatric/Behavioral: Negative for dysphoric mood and suicidal ideas.    Objective:  BP 109/72   Pulse 61   Temp (!) 97.5 F (36.4 C) (Oral)   Ht '6\' 4"'$  (1.93 m)   Wt 174 lb 9.6 oz (79.2 kg)   SpO2 100%   BMI 21.25 kg/m   BP/Weight  03/07/2018 01/15/2018 11/08/863  Systolic BP 784 696 295  Diastolic BP 72 78 90  Wt. (Lbs) 174.6 172 180  BMI 21.25 20.94 21.91      Physical Exam  Constitutional: He is oriented to person, place, and time. He appears well-developed and well-nourished.  Cardiovascular: Normal rate, normal heart sounds and intact distal pulses.  No murmur heard. Pulmonary/Chest: Effort normal and breath sounds normal. He has no wheezes. He has no rales. He exhibits no tenderness.  Abdominal: Soft. Bowel sounds are normal. He exhibits no distension and no mass. There is no tenderness.  Musculoskeletal: Normal range of motion.  Neurological: He is alert and oriented to person, place, and time.  Motor strength RUE, RLE - 5/5 LUE - 3+/5, LLE- 4/5  Skin: Skin is warm and dry.  Psychiatric: He has a normal mood and affect.     Assessment & Plan:   1. Essential hypertension Controlled Counseled on blood pressure goal of less than 130/80, low-sodium, DASH diet, medication compliance, 150 minutes of moderate intensity exercise per week. Discussed medication compliance, adverse effects. - amLODipine (NORVASC) 10 MG tablet; Take 1 tablet (10 mg total) by mouth every morning.  Dispense: 30 tablet;  Refill: 3 - hydrALAZINE (APRESOLINE) 50 MG tablet; Take 1 tablet (50 mg total) by mouth 3 (three) times daily.  Dispense: 270 tablet; Refill: 3 - lisinopril (PRINIVIL,ZESTRIL) 10 MG tablet; Take 1 tablet (10 mg total) by mouth daily.  Dispense: 30 tablet; Refill: 3 - CMP14+EGFR  2. History of CVA with residual deficit Left hemiparesis Risk factor modification - atorvastatin (LIPITOR) 40 MG tablet; Take 1 tablet (40 mg total) by mouth daily.  Dispense: 30 tablet; Refill: 3  3. Seizures ([place]) No recent seizures - divalproex (DEPAKOTE) 500 MG DR tablet; Take 1 tablet (500 mg total) by mouth 2 (two) times daily.  Dispense: 60 tablet; Refill: 3  4. CKD (chronic kidney disease) stage 3, GFR 30-59 ml/min ([name]) Stable Avoid Nephrotoxins   Meds ordered this encounter  Medications  . amLODipine (NORVASC) 10 MG tablet    Sig: Take 1 tablet (10 mg total) by mouth every morning.    Dispense:  30 tablet    Refill:  3  . atorvastatin (LIPITOR) 40 MG tablet    Sig: Take 1 tablet (40 mg total) by mouth daily.    Dispense:  30 tablet    Refill:  3  . divalproex (DEPAKOTE) 500 MG DR tablet    Sig: Take 1 tablet (500 mg total) by mouth 2 (two) times daily.    Dispense:  60 tablet    Refill:  3  . hydrALAZINE (APRESOLINE) 50 MG tablet    Sig: Take 1 tablet (50 mg total) by mouth 3 (three) times daily.    Dispense:  270 tablet    Refill:  3  . lisinopril (PRINIVIL,ZESTRIL) 10 MG tablet    Sig: Take 1 tablet (10 mg total) by mouth daily.    Dispense:  30 tablet    Refill:  3  . polyethylene glycol (MIRALAX / GLYCOLAX) packet    Sig: Take 17 g by mouth daily as needed for mild constipation.    Dispense:  30 each    Refill:  3    Follow-up: Return in about 3 months (around 06/07/2018) for Follow-up of chronic medical conditions.   [name] MD

## 2018-03-07 NOTE — Telephone Encounter (Signed)
New message   Patients spouse calling with questions regarding pacemaker No chest  Pain No shortness of breath. No complaints from patient other than leg weakness.   . 1. Has your device fired? NO  2. Is you device beeping? NO  3. Are you experiencing draining or swelling at device site? NO  4. Are you calling to see if we received your device transmission? NO 5. Have you passed out? NO    Please route to [place]

## 2018-03-07 NOTE — Telephone Encounter (Signed)
Remote transmission reviewed. Presenting rhythm: ApVp. No episodes recorded. Stable device function. Lead measurements WNL.   Called patient/wife and informed them of remote results. Wife states that patient has changed since his appt with his PCP this am. She states that patient is now c/o neck pain since she hit a pothole on the way home. She also said that his legs are weak. She said that his PCP mentioned PT at his appt earlier. I told her that his problems are not r/t ppm and do not sound cardiac. I told her to call the PCP about her concerns. Patient's wife verbalized understanding and appreciation of information.

## 2018-03-07 NOTE — Patient Instructions (Signed)

## 2018-03-08 LAB — CMP14+EGFR
ALBUMIN: 4 g/dL (ref 3.6–4.8)
ALT: 6 IU/L (ref 0–44)
AST: 10 IU/L (ref 0–40)
Albumin/Globulin Ratio: 1.5 (ref 1.2–2.2)
Alkaline Phosphatase: 92 IU/L (ref 39–117)
BILIRUBIN TOTAL: 0.3 mg/dL (ref 0.0–1.2)
BUN / CREAT RATIO: 10 (ref 10–24)
BUN: 19 mg/dL (ref 8–27)
CO2: 23 mmol/L (ref 20–29)
CREATININE: 1.84 mg/dL — AB (ref 0.76–1.27)
Calcium: 10.3 mg/dL — ABNORMAL HIGH (ref 8.6–10.2)
Chloride: 104 mmol/L (ref 96–106)
GFR calc non Af Amer: 37 mL/min/{1.73_m2} — ABNORMAL LOW (ref >59)
GFR, EST AFRICAN AMERICAN: 43 mL/min/{1.73_m2} — AB (ref >59)
GLUCOSE: 73 mg/dL (ref 65–99)
Globulin, Total: 2.6 g/dL (ref 1.5–4.5)
Potassium: 5.2 mmol/L (ref 3.5–5.2)
Sodium: 141 mmol/L (ref 134–144)
TOTAL PROTEIN: 6.6 g/dL (ref 6.0–8.5)

## 2018-03-09 ENCOUNTER — Encounter: Payer: Self-pay | Admitting: Family Medicine

## 2018-03-19 ENCOUNTER — Ambulatory Visit: Payer: Medicare HMO | Admitting: Diagnostic Neuroimaging

## 2018-03-19 ENCOUNTER — Telehealth: Payer: Self-pay | Admitting: Diagnostic Neuroimaging

## 2018-03-19 ENCOUNTER — Encounter: Payer: Self-pay | Admitting: Diagnostic Neuroimaging

## 2018-03-19 VITALS — BP 127/75 | HR 68 | Ht 76.0 in

## 2018-03-19 DIAGNOSIS — R569 Unspecified convulsions: Secondary | ICD-10-CM | POA: Diagnosis not present

## 2018-03-19 DIAGNOSIS — M6289 Other specified disorders of muscle: Secondary | ICD-10-CM | POA: Diagnosis not present

## 2018-03-19 DIAGNOSIS — R531 Weakness: Secondary | ICD-10-CM | POA: Diagnosis not present

## 2018-03-19 DIAGNOSIS — R1310 Dysphagia, unspecified: Secondary | ICD-10-CM | POA: Diagnosis not present

## 2018-03-19 DIAGNOSIS — G6181 Chronic inflammatory demyelinating polyneuritis: Secondary | ICD-10-CM

## 2018-03-19 MED ORDER — DIVALPROEX SODIUM 500 MG PO DR TAB: 500.0000 mg | DELAYED_RELEASE_TABLET | Freq: Two times a day (BID) | ORAL | 4 refills | Status: DC

## 2018-03-19 NOTE — Progress Notes (Signed)
[facility]  PATIENT: Darrell [name] DOB: [birth year]  REFERRING CLINICIAN: Vanita Panda HISTORY FROM: patient and wife REASON FOR VISIT: follow up   HISTORICAL  CHIEF COMPLAINT:  Chief Complaint  Patient presents with  . Seizures    rm 7, wife- [name], "no seizure activity, balance still bad, lack of strength"  . Follow-up    6 month    HISTORY OF PRESENT ILLNESS:   UPDATE (03/19/18, VRP): Since last visit, doing well. No seizures. No alleviating or aggravating factors. More muscle stiffness.   NEW HPI (09/13/17, VRP): 67 year old male with CIDP, here for seizure evaluation. Since last visit, patient declined IVIG therapy that I had ordered in 2015, and then patient lost to followup. Gait and balance continue to decline. 20lb weight loss over last year. Now with new onset seizure in Oct 2018 at home (witnessed by wife). Admitted to hospital and started on levetiracetam, then changed to divalproex 500mg  twice a day, and doing well. No further seizures. No alleviating or aggravating factors.   UPDATE 03/30/14: Since last visit, patient feels strength is stable. Still with slurred speech, swallow diff, balance diff. Now he is interested in IVIG treatment. Completed PT sessions.  UPDATE 11/25/13: Since last visit patient had EMG and spinal tap. Findings are suspicious for CIDP. Patient's symptoms have gradually progressed. Continues to have more upper and lower extremity weakness, dysarthria, dysphasia and intermittent muscle twitching. Patient is frustrated today and does not want to take anymore medication. Patient's wife would like to find out more options for treatment.  UPDATE 10/03/13: Since last visit, continues to have left sided weakness, worsening speech and swallow difficulty. Is drinking half pint Etoh per 1-2 weeks. Cigarette use has increased to 2 packs per day.  PRIOR HPI (04/03/13): 67 year old right-handed male with hypertension, hypercholesterolemia,  smoking, alcohol abuse, here for evaluation of gait and balance difficulty for past one year.  2011 patient had left arm and left leg weakness, diagnosed with stroke. Apparently he was admitted to hospital and treated. He had fairly good recovery initial one year after stroke. He required using a walking stick but otherwise was fairly mobile. Unfortunately he did not have good medical followup at that time. Patient was abusing alcohol significantly at that time, ranging from 1 pint up to a fifth of alcohol per day. In the past one to 2 years he has cut down on his drinking, now averaging 1 pint of alcohol per week.  In the past one year, patient's mobility is significantly deteriorated. He also has increasing slurred speech, trouble swallowing, balance and gait difficulty. He continues to fall down. Symptoms are worse when he drinks alcohol. Nowadays he is unable to stand up and walk across a room without assistance. This is been the case for the past one year.  REVIEW OF SYSTEMS: Full 14 system review of systems performed and negative except: as per HPI.    ALLERGIES: Allergies  Allergen Reactions  . Doxycycline Other (See Comments)    Hiccups, cough, nausea and emesis, elevated liver enzymes, elevated eosinophils, SOB concerning for early DRESS syndrome   . Atacand Hct [Candesartan Cilexetil-Hctz] Hives  . Shellfish Allergy Hives    HOME MEDICATIONS: Outpatient Medications Prior to Visit  Medication Sig Dispense Refill  . acetaminophen (TYLENOL) 325 MG tablet Take 325 mg by mouth every 6 (six) hours as needed for mild pain or headache. Take 2 tablets every 6 hours as needed for fever greater than 101.     Marland Kitchen  amLODipine (NORVASC) 10 MG tablet Take 1 tablet (10 mg total) by mouth every morning. 30 tablet 3  . aspirin EC 81 MG tablet Take 1 tablet (81 mg total) by mouth daily. 90 tablet 3  . atorvastatin (LIPITOR) 40 MG tablet Take 1 tablet (40 mg total) by mouth daily. 30 tablet 3  .  brimonidine (ALPHAGAN P) 0.1 % SOLN Place 1 drop into both eyes daily.     . divalproex (DEPAKOTE) 500 MG DR tablet Take 1 tablet (500 mg total) by mouth 2 (two) times daily. 60 tablet 3  . dorzolamide-timolol (COSOPT) 22.3-6.8 MG/ML ophthalmic solution Place 1 drop into both eyes daily.     . fluticasone (FLONASE) 50 MCG/ACT nasal spray Place 2 sprays into both nostrils daily. 16 g 6  . folic acid (FOLVITE) 1 MG tablet Take 1 tablet (1 mg total) by mouth daily. 90 tablet 1  . hydrALAZINE (APRESOLINE) 50 MG tablet Take 1 tablet (50 mg total) by mouth 3 (three) times daily. 270 tablet 3  . ketoconazole (NIZORAL) 2 % cream Apply 1 application topically daily as needed for irritation.    Marland Kitchen latanoprost (XALATAN) 0.005 % ophthalmic solution Place 1 drop into both eyes at bedtime.    Marland Kitchen lisinopril (PRINIVIL,ZESTRIL) 10 MG tablet Take 1 tablet (10 mg total) by mouth daily. 30 tablet 3  . metoprolol tartrate (LOPRESSOR) 50 MG tablet Take 1 tablet (50 mg total) by mouth 2 (two) times daily. 180 tablet 3  . Multiple Vitamins-Iron (DAILY VITAMIN FORMULA+IRON) TABS Take 1 tablet by mouth daily.    . polyethylene glycol (MIRALAX / GLYCOLAX) packet Take 17 g by mouth daily as needed for mild constipation. 30 each 3  . Sennosides-Docusate Sodium (SENNA S PO) Take 1 tablet by mouth daily as needed (constipation).     . terbinafine (LAMISIL AT) 1 % cream Apply 1 application topically 2 (two) times daily. To R foot (Patient taking differently: Apply 1 application topically daily as needed (rash). ) 30 g 0  . thiamine 100 MG tablet Take 1 tablet (100 mg total) by mouth daily. 30 tablet 0  . torsemide (DEMADEX) 20 MG tablet Take 20 mg by mouth daily.    . traMADol (ULTRAM) 50 MG tablet Take 1 tablet (50 mg total) by mouth every 6 (six) hours as needed for moderate pain. (Patient not taking: Reported on 03/19/2018) 15 tablet 0   No facility-administered medications prior to visit.     PAST MEDICAL HISTORY: Past  Medical History:  Diagnosis Date  . Arthritis   . At high risk for falls 08/16/2015  . Bradycardia   . Chronic kidney disease    stage 3 GFR 30-59 ml/min   . CIDP (chronic inflammatory demyelinating polyneuropathy) ([place])   . CKD (chronic kidney disease) stage 3, GFR 30-59 ml/min ([name]) 08/16/2015  . Dysphagia as late effect of cerebrovascular disease    pts wife states pt has to eat soft foods   . Elevated liver enzymes 08/10/2016  . GERD (gastroesophageal reflux disease)   . Glaucoma   . High cholesterol   . History of CVA with residual deficit 03/25/2013  . Hypertension   . Hypertensive retinopathy of both eyes 01/16/2017  . Inguinal hernia 03/25/2013  . Liver hemangioma 08/14/2016  . Neuromuscular disorder ([place])    chronic inflammatory demyelinating polyneuropathy   . New onset seizure ([place]) 07/08/2017  . Nuclear sclerosis of both eyes 10/25/2016  . Presence of permanent cardiac pacemaker    placed in  april 2018  . Primary open angle glaucoma of both eyes, indeterminate stage 10/25/2016  . Renal mass, right 08/14/2016  . Status cardiac pacemaker 01/29/2017   Placed for second degree heart block on 01/16/17 Medtronic Azure XT DR MRI SureScan dual-chamber pacemaker  . Stroke [facility])    2011 with residual deficit left sided weakness  . Tobacco dependence     PAST SURGICAL HISTORY: Past Surgical History:  Procedure Laterality Date  . EYE SURGERY    . HERNIA REPAIR    . INGUINAL HERNIA REPAIR Right 11/23/2014   Procedure: right inguinal hernia repair with mesh;  Surgeon: Armandina Gemma, MD;  Location: [facility];  Service: General;  Laterality: Right;  . INSERTION OF MESH N/A 11/23/2014   Procedure: INSERTION OF MESH;  Surgeon: Armandina Gemma, MD;  Location: [facility];  Service: General;  Laterality: N/A;  . MASS EXCISION Left 08/29/2017   Procedure: EXCISION OF LEFT NECK MASS;  Surgeon: Coralie Keens, MD;  Location: [facility];  Service: General;  Laterality: Left;  . PACEMAKER IMPLANT N/A 01/16/2017     Procedure: Pacemaker Implant;  Surgeon: Will Meredith Leeds, MD;  Location: [facility];  Service: Cardiovascular;  Laterality: N/A;  . SHOULDER SURGERY Bilateral 1988, 1998    FAMILY HISTORY: Family History  Problem Relation Age of Onset  . Hypertension Mother   . Diabetes Sister   . Hypertension Sister   . Cancer Sister        1 sister  . COPD Sister        in 1 sister    SOCIAL HISTORY:  Social History   Socioeconomic History  . Marital status: Married    Spouse name: [name]  . Number of children: 1  . Years of education: college  . Highest education level: Not on file  Occupational History  . Occupation: retired  Scientific laboratory technician  . Financial resource strain: Not on file  . Food insecurity:    Worry: Not on file    Inability: Not on file  . Transportation needs:    Medical: Not on file    Non-medical: Not on file  Tobacco Use  . Smoking status: Current Every Day Smoker    Packs/day: 1.00    Years: 40.00    Pack years: 40.00    Types: Cigarettes  . Smokeless tobacco: Never Used  . Tobacco comment: 09/13/17 little < 1 PPD  Substance and Sexual Activity  . Alcohol use: No    Comment: alcohol free for 1 year was drinking 1/2 pint per day   . Drug use: No  . Sexual activity: Not on file  Lifestyle  . Physical activity:    Days per week: Not on file    Minutes per session: Not on file  . Stress: Not on file  Relationships  . Social connections:    Talks on phone: Not on file    Gets together: Not on file    Attends religious service: Not on file    Active member of club or organization: Not on file    Attends meetings of clubs or organizations: Not on file    Relationship status: Not on file  . Intimate partner violence:    Fear of current or ex partner: Not on file    Emotionally abused: Not on file    Physically abused: Not on file    Forced sexual activity: Not on file  Other Topics Concern  . Not on file  Social History Narrative  09/13/17 Patient lives at home with spouse.   Has 68 yo daughter   No grandchildren     PHYSICAL EXAM  Vitals:   03/19/18 1022  BP: 127/75  Pulse: 68  Height: 6\' 4"  (1.93 m)    Not recorded      Body mass index is 21.25 kg/m.  GENERAL EXAM: Patient is in no distress  CARDIOVASCULAR: Regular rate and rhythm, no murmurs, no carotid bruits  NEUROLOGIC: MENTAL STATUS: awake, alert, language fluent, comprehension intact, naming intact CRANIAL NERVE: no papilledema on fundoscopic exam, pupils equal and reactive to light, visual fields full to confrontation, extraocular muscles intact, no nystagmus, facial sensation and strength symmetric, uvula midline, shoulder shrug symmetric, tongue midline. MOD DYSARTHIA. MOD DYSPHAGIA. INTERMITTENT TONGUE FASCICULATIONS.  MOTOR: LEFT TRICEPS FASCICULATIONS. INCREASED TONE IN LUE > RUE; LLE > RLE. RUE 5, RLE 5. LUE 3 PROX, 3-4 DISTAL. LLE 5. SENSORY: DECR IN LUE AND LLE TO TEMP AND VIBRATION COORDINATION: LIMITED IN LEFT SIDE. REFLEXES: BUE and BLE 2+, BUT SLIGHTLY MORE ON LEFT SIDE THAN RIGHT. CLONUS AT ANKLES (L > R) GAIT/STATION: LEANS BACK IN WHEELCHAIR; CANNOT STAND INDEPENDENTLY    DIAGNOSTIC DATA (LABS, IMAGING, TESTING) - I reviewed patient records, labs, notes, testing and imaging myself where available.  Lab Results  Component Value Date   WBC 8.5 08/23/2017   HGB 12.3 (L) 08/23/2017   HCT 38.6 (L) 08/23/2017   MCV 92.3 08/23/2017   PLT 175 08/23/2017      Component Value Date/Time   NA 141 03/07/2018 1134   K 5.2 03/07/2018 1134   CL 104 03/07/2018 1134   CO2 23 03/07/2018 1134   GLUCOSE 73 03/07/2018 1134   GLUCOSE 89 08/29/2017 0702   BUN 19 03/07/2018 1134   CREATININE 1.84 (H) 03/07/2018 1134   CREATININE 1.45 (H) 08/10/2016 1647   CALCIUM 10.3 (H) 03/07/2018 1134   PROT 6.6 03/07/2018 1134   ALBUMIN 4.0 03/07/2018 1134   AST 10 03/07/2018 1134   ALT 6 03/07/2018 1134   ALKPHOS 92 03/07/2018 1134    BILITOT 0.3 03/07/2018 1134   GFRNONAA 37 (L) 03/07/2018 1134   GFRNONAA 51 (L) 08/10/2016 1647   GFRAA 43 (L) 03/07/2018 1134   GFRAA 58 (L) 08/10/2016 1647   Lab Results  Component Value Date   CHOL 141 08/16/2015   HDL 34 (L) 08/16/2015   LDLCALC 84 08/16/2015   TRIG 116 08/16/2015   CHOLHDL 4.1 08/16/2015   Lab Results  Component Value Date   HGBA1C 5.3 10/07/2013   Lab Results  Component Value Date   YBOFBPZW25 852 08/16/2015   Lab Results  Component Value Date   TSH 0.476 07/08/2017    02/27/13 CT head - severe chronic small vessel ischemic disease, periventricular, subcortical, pontine and cerebellar.   04/22/13 MRI BRAIN 1. Scattered chronic lacunar ischemic infarctions in the bilateral thalami, right basal ganglia, right centrum semioval and left anterior parasagittal regions.  2. Moderate chronic small vessel ischemic disease. Few small, chronic cerebral microbleeds in the right parietal and and left frontal regions, also within spectrum of chronic small vessel ischemic disease. 3. No acute findings.  04/22/13 MRI CERVICAL SPINE 1. At C5-6: disc bulging and facet hypertrophy with mild spinal stenosis (AP diameter 63mm) and severe biforaminal foraminal stenosis; no cord signal changes. 2. Multilevel degenerative changes and foraminal stenosis from C2-3 to C6-7 as above.   10/20/13 EMG/NCS 1. 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 Abnormal F-wave latencies (  including  absent F-waves and slowing in the demyelinating range in some  nerves) raises possibility of concomitant polyradiculopathy.  Considerations include chronic immune/inflammatory neuropathies,  such as CIDP. 2. No evidence of myopathy at this time.  10/31/13 CSF results: WBC 5-->0, RBC 25-->2, protein 63, glucose 62, ACE 5, cytology negati ve, culture negative  07/08/17 EEG - There is a posterior dominant rhythm of 7-8 Hz reactive to eye opening and closure.  No  focal slowing is present.  No epileptiform discharges or seizures. - There is evidence of mild generalized slowing of brain activity consistent with probable post-ictal state +/- toxic, metabolic, or infectious encephalopathy.  The patient is not in non-convulsive status epilepticus.   07/09/17 MRI brain  - Truncated and motion degraded examination. Within that limitation, no acute ischemia or significant mass effect. Advanced chronic microvascular disease.    ASSESSMENT AND PLAN  67 y.o. year old male  has a past medical history of Arthritis, At high risk for falls (08/16/2015), Bradycardia, Chronic kidney disease, CIDP (chronic inflammatory demyelinating polyneuropathy) ([place]), CKD (chronic kidney disease) stage 3, GFR 30-59 ml/min ([name]) (08/16/2015), Dysphagia as late effect of cerebrovascular disease, Elevated liver enzymes (08/10/2016), GERD (gastroesophageal reflux disease), Glaucoma, High cholesterol, History of CVA with residual deficit (03/25/2013), Hypertension, Hypertensive retinopathy of both eyes (01/16/2017), Inguinal hernia (03/25/2013), Liver hemangioma (08/14/2016), Neuromuscular disorder ([place]), New onset seizure ([place]) (07/08/2017), Nuclear sclerosis of both eyes (10/25/2016), Presence of permanent cardiac pacemaker, Primary open angle glaucoma of both eyes, indeterminate stage (10/25/2016), Renal mass, right (08/14/2016), Status cardiac pacemaker (01/29/2017), Stroke ([place]), and Tobacco dependence. here with progressive gait and balance or deterioration since 2013. Also with dysarthria, dysphagia, left sided weakness and hyperreflexia.   Now with new onset seizure in Oct 2018. Now stable on divalproex 500mg  twice a day.   Dx1 gait and balance diff: CIDP vs (chronic alcoholic neuropathy + motor neuron disease)  Dx2: new onset seizure  1. New onset seizure ([place])   2. CIDP (chronic inflammatory demyelinating polyneuropathy) ([name]) Chronic     PLAN:  MUSCLE WEAKNESS / NEUROPATHY /  HYPERTONIA / DYSPHAGIA (? CIDP vs etoh neuropathy + MND) - mixed picture of sensory and motor deficits, with increased tone; no specific etiology of upper motor neuron signs found except mild spinal stenosis in cervical spine MRI from 2014 and advanced chronic small vessel ischemic disease; could consider insidious onset of motor neuron disease, with concomitant sensory abnormalities due to etoh neuropathy, polyradiculopathy (CIDP) or cerebrovascular disease - I offered IVIG or prednisone trial, but patient declined - will [facility] for second opinion  NEW ONSET SEIZURE (? related to CNS vascular disease) - continue divalproex 500mg  twice a day  - check annual CBC, CMP  CEREBROVACULAR DISEASE - Risk factor mgmt per PCP for cerebrovascular disease - continue smoking and ETOH cessation  CERVICAL SPINAL STENOSIS - Conservative mgmt of cervical spinal stenosis (mild at C5-6)  Meds ordered this encounter  Medications  . divalproex (DEPAKOTE) 500 MG DR tablet    Sig: Take 1 tablet (500 mg total) by mouth 2 (two) times daily.    Dispense:  180 tablet    Refill:  4   Orders Placed This Encounter  Procedures  . Ambulatory referral to Neurology  . Ambulatory referral to Physical Therapy  . Ambulatory referral to Occupational Therapy   Return in about 1 year (around 03/20/2019).    Penni Bombard, MD 8/67/6195, 09:32 AM Certified in Neurology, Neurophysiology and Neuroimaging  [facility] [address], [address] [place], [place] [address] [phone] 

## 2018-03-19 NOTE — Telephone Encounter (Signed)
Waiting for Nurse Wynetta Emery to call me back from [facility] Telephone [phone] . Nurse Wynetta Emery Has to schedule the apt. And give me the fax number to send notes for the ALS clinic.

## 2018-03-25 ENCOUNTER — Ambulatory Visit (INDEPENDENT_AMBULATORY_CARE_PROVIDER_SITE_OTHER): Payer: Medicare HMO | Admitting: *Deleted

## 2018-03-25 DIAGNOSIS — I441 Atrioventricular block, second degree: Secondary | ICD-10-CM

## 2018-03-25 NOTE — Progress Notes (Signed)
Remote pacemaker transmission.   

## 2018-03-29 LAB — CUP PACEART REMOTE DEVICE CHECK
Battery Remaining Longevity: 122 mo
Battery Voltage: 3.02 V
Brady Statistic AP VP Percent: 20.74 %
Brady Statistic AS VP Percent: 77.5 %
Brady Statistic RA Percent Paced: 22.02 %
Brady Statistic RV Percent Paced: 98.24 %
Implantable Lead Implant Date: 20180424
Implantable Lead Implant Date: 20180424
Implantable Lead Location: 753860
Implantable Lead Model: 5076
Implantable Lead Model: 5076
Implantable Pulse Generator Implant Date: 20180424
Lead Channel Impedance Value: 304 Ohm
Lead Channel Impedance Value: 361 Ohm
Lead Channel Impedance Value: 418 Ohm
Lead Channel Pacing Threshold Amplitude: 0.625 V
Lead Channel Pacing Threshold Pulse Width: 0.4 ms
Lead Channel Pacing Threshold Pulse Width: 0.4 ms
Lead Channel Sensing Intrinsic Amplitude: 1.875 mV
Lead Channel Sensing Intrinsic Amplitude: 1.875 mV
Lead Channel Setting Pacing Amplitude: 1.5 V
MDC IDC LEAD LOCATION: 753859
MDC IDC MSMT LEADCHNL RA IMPEDANCE VALUE: 399 Ohm
MDC IDC MSMT LEADCHNL RV PACING THRESHOLD AMPLITUDE: 0.5 V
MDC IDC MSMT LEADCHNL RV SENSING INTR AMPL: 14.125 mV
MDC IDC MSMT LEADCHNL RV SENSING INTR AMPL: 14.125 mV
MDC IDC SESS DTM: 20190701051529
MDC IDC SET LEADCHNL RV PACING AMPLITUDE: 2.5 V
MDC IDC SET LEADCHNL RV PACING PULSEWIDTH: 0.4 ms
MDC IDC SET LEADCHNL RV SENSING SENSITIVITY: 2 mV
MDC IDC STAT BRADY AP VS PERCENT: 0.84 %
MDC IDC STAT BRADY AS VS PERCENT: 0.92 %

## 2018-04-12 ENCOUNTER — Telehealth: Payer: Self-pay

## 2018-04-12 NOTE — Telephone Encounter (Signed)
CALLED TO INVITE PATIENT TO SCHEDULE THEIR MEDICARE ANNUAL WELLNESS VISIT WITH [place] AT [facility], LEFT NUMBER FOR FRONT OFFICE TO SCHEDULE THEIR APPT

## 2018-05-07 ENCOUNTER — Other Ambulatory Visit: Payer: Self-pay

## 2018-05-07 ENCOUNTER — Other Ambulatory Visit: Payer: Self-pay | Admitting: *Deleted

## 2018-05-07 NOTE — Telephone Encounter (Signed)
Should this patient be taking 25 mg or 50 mg bid of metoprolol? It was changed to 50 mg bid at last office visit here, but a note was put in on 03/19/18 stating that the patient is taking 25 mg bid. Please advise. Thanks, MI

## 2018-05-07 NOTE — Telephone Encounter (Signed)
Error

## 2018-05-08 MED ORDER — METOPROLOL TARTRATE 50 MG PO TABS: 50.0000 mg | ORAL_TABLET | Freq: Two times a day (BID) | ORAL | 2 refills | Status: DC

## 2018-05-08 NOTE — Telephone Encounter (Signed)
OK to refill Lopressor 50 mg BID.   Ok to speak w/ wife, per pt. Reports pt wasn't sure which dose he should be taking. Stated that they had 2 different bottles and pt was still taking 25 mg ones. Advised/educated as to why medication was increased back in April. Pt will take the 50 mg BID. They will call if issues/SE arise after increased dosage.

## 2018-05-09 MED FILL — TORSEMIDE 20 MG TABLET: 20 | 30 days supply | Qty: 30 | Fill #0

## 2018-05-21 DIAGNOSIS — R69 Illness, unspecified: Secondary | ICD-10-CM | POA: Diagnosis not present

## 2018-05-21 DIAGNOSIS — R1310 Dysphagia, unspecified: Secondary | ICD-10-CM | POA: Insufficient documentation

## 2018-05-21 DIAGNOSIS — I69354 Hemiplegia and hemiparesis following cerebral infarction affecting left non-dominant side: Secondary | ICD-10-CM | POA: Diagnosis not present

## 2018-05-21 DIAGNOSIS — R4189 Other symptoms and signs involving cognitive functions and awareness: Secondary | ICD-10-CM | POA: Diagnosis not present

## 2018-05-21 DIAGNOSIS — R269 Unspecified abnormalities of gait and mobility: Secondary | ICD-10-CM | POA: Diagnosis not present

## 2018-05-21 DIAGNOSIS — R569 Unspecified convulsions: Secondary | ICD-10-CM | POA: Diagnosis not present

## 2018-05-21 DIAGNOSIS — R471 Dysarthria and anarthria: Secondary | ICD-10-CM | POA: Diagnosis not present

## 2018-05-21 DIAGNOSIS — R634 Abnormal weight loss: Secondary | ICD-10-CM | POA: Diagnosis not present

## 2018-05-21 DIAGNOSIS — R252 Cramp and spasm: Secondary | ICD-10-CM | POA: Insufficient documentation

## 2018-05-21 DIAGNOSIS — Z79899 Other long term (current) drug therapy: Secondary | ICD-10-CM | POA: Diagnosis not present

## 2018-06-13 ENCOUNTER — Ambulatory Visit: Payer: Medicare HMO | Attending: Family Medicine | Admitting: Family Medicine

## 2018-06-13 ENCOUNTER — Encounter: Payer: Self-pay | Admitting: Family Medicine

## 2018-06-13 VITALS — BP 155/93 | HR 61 | Temp 97.9°F | Ht 76.0 in | Wt 162.8 lb

## 2018-06-13 DIAGNOSIS — I6930 Unspecified sequelae of cerebral infarction: Secondary | ICD-10-CM

## 2018-06-13 DIAGNOSIS — Z681 Body mass index (BMI) 19 or less, adult: Secondary | ICD-10-CM | POA: Diagnosis not present

## 2018-06-13 DIAGNOSIS — I129 Hypertensive chronic kidney disease with stage 1 through stage 4 chronic kidney disease, or unspecified chronic kidney disease: Secondary | ICD-10-CM | POA: Diagnosis not present

## 2018-06-13 DIAGNOSIS — Z91013 Allergy to seafood: Secondary | ICD-10-CM | POA: Insufficient documentation

## 2018-06-13 DIAGNOSIS — Z9889 Other specified postprocedural states: Secondary | ICD-10-CM | POA: Insufficient documentation

## 2018-06-13 DIAGNOSIS — Z7982 Long term (current) use of aspirin: Secondary | ICD-10-CM | POA: Diagnosis not present

## 2018-06-13 DIAGNOSIS — Z23 Encounter for immunization: Secondary | ICD-10-CM

## 2018-06-13 DIAGNOSIS — Z125 Encounter for screening for malignant neoplasm of prostate: Secondary | ICD-10-CM

## 2018-06-13 DIAGNOSIS — Z888 Allergy status to other drugs, medicaments and biological substances status: Secondary | ICD-10-CM | POA: Diagnosis not present

## 2018-06-13 DIAGNOSIS — M1990 Unspecified osteoarthritis, unspecified site: Secondary | ICD-10-CM | POA: Insufficient documentation

## 2018-06-13 DIAGNOSIS — E7800 Pure hypercholesterolemia, unspecified: Secondary | ICD-10-CM | POA: Insufficient documentation

## 2018-06-13 DIAGNOSIS — K219 Gastro-esophageal reflux disease without esophagitis: Secondary | ICD-10-CM | POA: Insufficient documentation

## 2018-06-13 DIAGNOSIS — N183 Chronic kidney disease, stage 3 (moderate): Secondary | ICD-10-CM | POA: Diagnosis not present

## 2018-06-13 DIAGNOSIS — Z79899 Other long term (current) drug therapy: Secondary | ICD-10-CM | POA: Insufficient documentation

## 2018-06-13 DIAGNOSIS — I69354 Hemiplegia and hemiparesis following cerebral infarction affecting left non-dominant side: Secondary | ICD-10-CM | POA: Diagnosis not present

## 2018-06-13 DIAGNOSIS — G6181 Chronic inflammatory demyelinating polyneuritis: Secondary | ICD-10-CM | POA: Diagnosis not present

## 2018-06-13 DIAGNOSIS — Z881 Allergy status to other antibiotic agents status: Secondary | ICD-10-CM | POA: Insufficient documentation

## 2018-06-13 DIAGNOSIS — Z950 Presence of cardiac pacemaker: Secondary | ICD-10-CM | POA: Diagnosis not present

## 2018-06-13 DIAGNOSIS — Z1211 Encounter for screening for malignant neoplasm of colon: Secondary | ICD-10-CM | POA: Diagnosis not present

## 2018-06-13 DIAGNOSIS — R634 Abnormal weight loss: Secondary | ICD-10-CM | POA: Diagnosis not present

## 2018-06-13 DIAGNOSIS — I10 Essential (primary) hypertension: Secondary | ICD-10-CM

## 2018-06-13 DIAGNOSIS — R569 Unspecified convulsions: Secondary | ICD-10-CM | POA: Insufficient documentation

## 2018-06-13 DIAGNOSIS — H409 Unspecified glaucoma: Secondary | ICD-10-CM | POA: Insufficient documentation

## 2018-06-13 MED ORDER — ATORVASTATIN CALCIUM 40 MG PO TABS: 40.0000 mg | ORAL_TABLET | Freq: Every day | ORAL | 1 refills | Status: DC

## 2018-06-13 MED ORDER — AMLODIPINE BESYLATE 10 MG PO TABS: 10.0000 mg | ORAL_TABLET | Freq: Every morning | ORAL | 1 refills | Status: DC

## 2018-06-13 MED ORDER — LISINOPRIL 10 MG PO TABS: 10.0000 mg | ORAL_TABLET | Freq: Every day | ORAL | 1 refills | Status: DC

## 2018-06-13 MED ORDER — HYDRALAZINE HCL 50 MG PO TABS: 50.0000 mg | ORAL_TABLET | Freq: Three times a day (TID) | ORAL | 1 refills | Status: DC

## 2018-06-13 NOTE — Progress Notes (Signed)
Subjective:  Patient ID: BOHDI LEEDS, male    DOB: September 29, 1950  Age: 67 y.o. MRN: [id number]  CC: Hypertension   HPI Darrell [name]  is a 67 year old male with a history of hypertension, previous CVA with residual left-sided weakness, seizure in the setting of prior CVA, second degree AV block (status post medtronic dual chamber pacemaker) stage III CKD who presents today for a follow-up visit.  He is accompanied by his spouse and reports doing well. He has lost 12 lbs in the last 3 months but his wife says he has had a good appetite.His blood pressure is elevated but he is yet to take his antihypertensive today. Last month he was seen by [facility] with plans for EMG to assess for motor neuron deficit.  He denies recent seizures and has no chest pains or dyspnea His chronic kidney disease is followed by [facility].  Past Medical History:  Diagnosis Date  . Arthritis   . At high risk for falls 08/16/2015  . Bradycardia   . Chronic kidney disease    stage 3 GFR 30-59 ml/min   . CIDP (chronic inflammatory demyelinating polyneuropathy) ([place])   . CKD (chronic kidney disease) stage 3, GFR 30-59 ml/min ([name]) 08/16/2015  . Dysphagia as late effect of cerebrovascular disease    pts wife states pt has to eat soft foods   . Elevated liver enzymes 08/10/2016  . GERD (gastroesophageal reflux disease)   . Glaucoma   . High cholesterol   . History of CVA with residual deficit 03/25/2013  . Hypertension   . Hypertensive retinopathy of both eyes 01/16/2017  . Inguinal hernia 03/25/2013  . Liver hemangioma 08/14/2016  . Neuromuscular disorder ([place])    chronic inflammatory demyelinating polyneuropathy   . New onset seizure ([place]) 07/08/2017  . Nuclear sclerosis of both eyes 10/25/2016  . Presence of permanent cardiac pacemaker    placed in april 2018  . Primary open angle glaucoma of both eyes, indeterminate stage 10/25/2016  . Renal mass, right 08/14/2016  . Status cardiac  pacemaker 01/29/2017   Placed for second degree heart block on 01/16/17 Medtronic Azure XT DR MRI SureScan dual-chamber pacemaker  . Stroke [facility])    2011 with residual deficit left sided weakness  . Tobacco dependence     Past Surgical History:  Procedure Laterality Date  . EYE SURGERY    . HERNIA REPAIR    . INGUINAL HERNIA REPAIR Right 11/23/2014   Procedure: right inguinal hernia repair with mesh;  Surgeon: Armandina Gemma, MD;  Location: [facility];  Service: General;  Laterality: Right;  . INSERTION OF MESH N/A 11/23/2014   Procedure: INSERTION OF MESH;  Surgeon: Armandina Gemma, MD;  Location: [facility];  Service: General;  Laterality: N/A;  . MASS EXCISION Left 08/29/2017   Procedure: EXCISION OF LEFT NECK MASS;  Surgeon: Coralie Keens, MD;  Location: [place];  Service: General;  Laterality: Left;  . PACEMAKER IMPLANT N/A 01/16/2017   Procedure: Pacemaker Implant;  Surgeon: Will Meredith Leeds, MD;  Location: [place] [facility];  Service: Cardiovascular;  Laterality: N/A;  . SHOULDER SURGERY Bilateral 1988, 1998    Allergies  Allergen Reactions  . Doxycycline Other (See Comments)    Hiccups, cough, nausea and emesis, elevated liver enzymes, elevated eosinophils, SOB concerning for early DRESS syndrome   . Atacand Hct [Candesartan Cilexetil-Hctz] Hives  . Shellfish Allergy Hives     Outpatient Medications Prior to Visit  Medication Sig Dispense Refill  .  acetaminophen (TYLENOL) 325 MG tablet Take 325 mg by mouth every 6 (six) hours as needed for mild pain or headache. Take 2 tablets every 6 hours as needed for fever greater than 101.     Marland Kitchen aspirin EC 81 MG tablet Take 1 tablet (81 mg total) by mouth daily. 90 tablet 3  . brimonidine (ALPHAGAN P) 0.1 % SOLN Place 1 drop into both eyes daily.     . divalproex (DEPAKOTE) 500 MG DR tablet Take 1 tablet (500 mg total) by mouth 2 (two) times daily. 180 tablet 4  . dorzolamide-timolol (COSOPT) 22.3-6.8 MG/ML ophthalmic solution Place 1 drop into both  eyes daily.     . fluticasone (FLONASE) 50 MCG/ACT nasal spray Place 2 sprays into both nostrils daily. 16 g 6  . folic acid (FOLVITE) 1 MG tablet Take 1 tablet (1 mg total) by mouth daily. 90 tablet 1  . ketoconazole (NIZORAL) 2 % cream Apply 1 application topically daily as needed for irritation.    Marland Kitchen latanoprost (XALATAN) 0.005 % ophthalmic solution Place 1 drop into both eyes at bedtime.    . metoprolol tartrate (LOPRESSOR) 50 MG tablet Take 1 tablet (50 mg total) by mouth 2 (two) times daily. 180 tablet 2  . Multiple Vitamins-Iron (DAILY VITAMIN FORMULA+IRON) TABS Take 1 tablet by mouth daily.    . polyethylene glycol (MIRALAX / GLYCOLAX) packet Take 17 g by mouth daily as needed for mild constipation. 30 each 3  . Sennosides-Docusate Sodium (SENNA S PO) Take 1 tablet by mouth daily as needed (constipation).     . terbinafine (LAMISIL AT) 1 % cream Apply 1 application topically 2 (two) times daily. To R foot (Patient taking differently: Apply 1 application topically daily as needed (rash). ) 30 g 0  . thiamine 100 MG tablet Take 1 tablet (100 mg total) by mouth daily. 30 tablet 0  . torsemide (DEMADEX) 20 MG tablet Take 20 mg by mouth daily.    Marland Kitchen amLODipine (NORVASC) 10 MG tablet Take 1 tablet (10 mg total) by mouth every morning. 30 tablet 3  . atorvastatin (LIPITOR) 40 MG tablet Take 1 tablet (40 mg total) by mouth daily. 30 tablet 3  . hydrALAZINE (APRESOLINE) 50 MG tablet Take 1 tablet (50 mg total) by mouth 3 (three) times daily. 270 tablet 3  . lisinopril (PRINIVIL,ZESTRIL) 10 MG tablet Take 1 tablet (10 mg total) by mouth daily. 30 tablet 3  . traMADol (ULTRAM) 50 MG tablet Take 1 tablet (50 mg total) by mouth every 6 (six) hours as needed for moderate pain. (Patient not taking: Reported on 03/19/2018) 15 tablet 0   No facility-administered medications prior to visit.     ROS Review of Systems  Constitutional: Positive for unexpected weight change. Negative for activity change and  appetite change.  HENT: Negative for sinus pressure and sore throat.   Eyes: Negative for visual disturbance.  Respiratory: Negative for cough, chest tightness and shortness of breath.   Cardiovascular: Negative for chest pain and leg swelling.  Gastrointestinal: Negative for abdominal distention, abdominal pain, constipation and diarrhea.  Endocrine: Negative.   Genitourinary: Negative for dysuria.  Musculoskeletal: Positive for gait problem. Negative for joint swelling and myalgias.  Skin: Negative for rash.  Allergic/Immunologic: Negative.   Neurological: Positive for numbness. Negative for weakness and light-headedness.  Psychiatric/Behavioral: Negative for dysphoric mood and suicidal ideas.    Objective:  BP (!) 155/93 Comment: Patient has not had BP medication today.  Pulse 61   Temp  97.9 F (36.6 C) (Oral)   Ht 6' 4"  (1.93 m)   Wt 162 lb 12.8 oz (73.8 kg)   SpO2 100%   BMI 19.82 kg/m   BP/Weight 06/13/2018 03/19/2018 9/92/4268  Systolic BP 341 962 229  Diastolic BP 93 75 72  Wt. (Lbs) 162.8 - 174.6  BMI 19.82 21.25 21.25      Physical Exam  Constitutional: He is oriented to person, place, and time.  Thin,chronically ill looking  Cardiovascular: Normal rate, normal heart sounds and intact distal pulses.  No murmur heard. Pulmonary/Chest: Effort normal and breath sounds normal. He has no wheezes. He has no rales. He exhibits no tenderness.  Abdominal: Soft. Bowel sounds are normal. He exhibits no distension and no mass. There is no tenderness.  Musculoskeletal: Normal range of motion.  Neurological: He is alert and oriented to person, place, and time.  Spasticity of L upper extremity, b/l LE Dysarthria  Psychiatric: He has a normal mood and affect.     CMP Latest Ref Rng & Units 03/07/2018 12/05/2017 09/27/2017  Glucose 65 - 99 mg/dL 73 93 78  BUN 8 - 27 mg/dL 19 29(H) 39(H)  Creatinine 0.76 - 1.27 mg/dL 1.84(H) 1.86(H) 2.49(H)  Sodium 134 - 144 mmol/L 141 140 140   Potassium 3.5 - 5.2 mmol/L 5.2 5.2 5.1  Chloride 96 - 106 mmol/L 104 105 102  CO2 20 - 29 mmol/L 23 24 21   Calcium 8.6 - 10.2 mg/dL 10.3(H) 10.4(H) 9.9  Total Protein 6.0 - 8.5 g/dL 6.6 - -  Total Bilirubin 0.0 - 1.2 mg/dL 0.3 - -  Alkaline Phos 39 - 117 IU/L 92 - -  AST 0 - 40 IU/L 10 - -  ALT 0 - 44 IU/L 6 - -    Lipid Panel     Component Value Date/Time   CHOL 141 08/16/2015 1555   TRIG 116 08/16/2015 1555   HDL 34 (L) 08/16/2015 1555   CHOLHDL 4.1 08/16/2015 1555   VLDL 23 08/16/2015 1555   LDLCALC 84 08/16/2015 1555    . Lab Results  Component Value Date   TSH 0.476 07/08/2017    Assessment & Plan:   1. Essential hypertension Uncontrolled-  Yet to take antihypertensive Counseled on blood pressure goal of less than 130/80, low-sodium, DASH diet, medication compliance, 150 minutes of moderate intensity exercise per week. Discussed medication compliance, adverse effects. - CMP14+EGFR - Lipid panel - hydrALAZINE (APRESOLINE) 50 MG tablet; Take 1 tablet (50 mg total) by mouth 3 (three) times daily.  Dispense: 270 tablet; Refill: 1 - lisinopril (PRINIVIL,ZESTRIL) 10 MG tablet; Take 1 tablet (10 mg total) by mouth daily.  Dispense: 90 tablet; Refill: 1 - amLODipine (NORVASC) 10 MG tablet; Take 1 tablet (10 mg total) by mouth every morning.  Dispense: 90 tablet; Refill: 1  2. History of CVA with residual deficit With left hemiparesis,spasticity of lower extremities Risk factor modification - atorvastatin (LIPITOR) 40 MG tablet; Take 1 tablet (40 mg total) by mouth daily.  Dispense: 90 tablet; Refill: 1  3. Weight loss Lost 12 lbs in the last 3 months Appetite is good asper spouse Will screen for malignancy - PSA, total and free - TSH - CBC with Differential/Platelet - T4, free  4. Screening for colon cancer - Ambulatory referral to Gastroenterology  5. Screening for prostate cancer - PSA, total and free  6. Need for immunization against influenza - Flu  Vaccine QUAD 36+ mos IM   Meds ordered this encounter  Medications  .  hydrALAZINE (APRESOLINE) 50 MG tablet    Sig: Take 1 tablet (50 mg total) by mouth 3 (three) times daily.    Dispense:  270 tablet    Refill:  1  . lisinopril (PRINIVIL,ZESTRIL) 10 MG tablet    Sig: Take 1 tablet (10 mg total) by mouth daily.    Dispense:  90 tablet    Refill:  1  . atorvastatin (LIPITOR) 40 MG tablet    Sig: Take 1 tablet (40 mg total) by mouth daily.    Dispense:  90 tablet    Refill:  1  . amLODipine (NORVASC) 10 MG tablet    Sig: Take 1 tablet (10 mg total) by mouth every morning.    Dispense:  90 tablet    Refill:  1    Follow-up: Return in about 3 months (around 09/12/2018) for Follow-up of chronic medical conditions.   [name] MD

## 2018-06-13 NOTE — Patient Instructions (Signed)

## 2018-06-14 ENCOUNTER — Encounter: Payer: Self-pay | Admitting: Family Medicine

## 2018-06-18 LAB — T4, FREE: Free T4: 1.46 ng/dL (ref 0.82–1.77)

## 2018-06-18 LAB — CBC WITH DIFFERENTIAL/PLATELET
Basophils Absolute: 0.1 x10E3/uL (ref 0.0–0.2)
Basos: 1 %
EOS (ABSOLUTE): 0.5 x10E3/uL — ABNORMAL HIGH (ref 0.0–0.4)
Eos: 7 %
Hematocrit: 38.1 % (ref 37.5–51.0)
Hemoglobin: 12.7 g/dL — ABNORMAL LOW (ref 13.0–17.7)
Immature Grans (Abs): 0 x10E3/uL (ref 0.0–0.1)
Immature Granulocytes: 0 %
Lymphocytes Absolute: 2.6 x10E3/uL (ref 0.7–3.1)
Lymphs: 36 %
MCH: 28.4 pg (ref 26.6–33.0)
MCHC: 33.3 g/dL (ref 31.5–35.7)
MCV: 85 fL (ref 79–97)
Monocytes Absolute: 0.6 x10E3/uL (ref 0.1–0.9)
Monocytes: 8 %
Neutrophils Absolute: 3.4 x10E3/uL (ref 1.4–7.0)
Neutrophils: 48 %
Platelets: 321 x10E3/uL (ref 150–450)
RBC: 4.47 x10E6/uL (ref 4.14–5.80)
RDW: 13.1 % (ref 12.3–15.4)
WBC: 7.1 x10E3/uL (ref 3.4–10.8)

## 2018-06-18 LAB — CMP14+EGFR
ALBUMIN: 4.1 g/dL (ref 3.6–4.8)
ALT: 6 IU/L (ref 0–44)
AST: 8 IU/L (ref 0–40)
Albumin/Globulin Ratio: 1.6 (ref 1.2–2.2)
Alkaline Phosphatase: 92 IU/L (ref 39–117)
BUN / CREAT RATIO: 11 (ref 10–24)
BUN: 19 mg/dL (ref 8–27)
Bilirubin Total: 0.4 mg/dL (ref 0.0–1.2)
CO2: 26 mmol/L (ref 20–29)
CREATININE: 1.78 mg/dL — AB (ref 0.76–1.27)
Calcium: 10.8 mg/dL — ABNORMAL HIGH (ref 8.6–10.2)
Chloride: 103 mmol/L (ref 96–106)
GFR calc non Af Amer: 39 mL/min/{1.73_m2} — ABNORMAL LOW (ref >59)
GFR, EST AFRICAN AMERICAN: 45 mL/min/{1.73_m2} — AB (ref >59)
GLOBULIN, TOTAL: 2.5 g/dL (ref 1.5–4.5)
GLUCOSE: 84 mg/dL (ref 65–99)
Potassium: 4.4 mmol/L (ref 3.5–5.2)
SODIUM: 143 mmol/L (ref 134–144)
TOTAL PROTEIN: 6.6 g/dL (ref 6.0–8.5)

## 2018-06-18 LAB — PSA, TOTAL AND FREE
PROSTATE SPECIFIC AG, SERUM: 2.2 ng/mL (ref 0.0–4.0)
PSA, Free Pct: 30.5 %
PSA, Free: 0.67 ng/mL

## 2018-06-18 LAB — LIPID PANEL
Chol/HDL Ratio: 2.8 ratio (ref 0.0–5.0)
Cholesterol, Total: 112 mg/dL (ref 100–199)
HDL: 40 mg/dL (ref >39)
LDL CALC: 62 mg/dL (ref 0–99)
Triglycerides: 51 mg/dL (ref 0–149)
VLDL CHOLESTEROL CAL: 10 mg/dL (ref 5–40)

## 2018-06-18 LAB — TSH: TSH: 1.6 u[IU]/mL (ref 0.450–4.500)

## 2018-06-24 ENCOUNTER — Ambulatory Visit (INDEPENDENT_AMBULATORY_CARE_PROVIDER_SITE_OTHER): Payer: Medicare HMO | Admitting: *Deleted

## 2018-06-24 DIAGNOSIS — I441 Atrioventricular block, second degree: Secondary | ICD-10-CM | POA: Diagnosis not present

## 2018-06-24 DIAGNOSIS — N183 Chronic kidney disease, stage 3 (moderate): Secondary | ICD-10-CM | POA: Diagnosis not present

## 2018-06-24 NOTE — Progress Notes (Signed)
Remote pacemaker transmission.   

## 2018-06-26 LAB — CUP PACEART REMOTE DEVICE CHECK
Battery Remaining Longevity: 118 mo
Battery Voltage: 3.01 V
Brady Statistic AP VS Percent: 1.19 %
Brady Statistic AS VS Percent: 4.17 %
Brady Statistic RV Percent Paced: 94.63 %
Implantable Lead Implant Date: 20180424
Implantable Lead Location: 753859
Implantable Lead Model: 5076
Lead Channel Impedance Value: 304 Ohm
Lead Channel Impedance Value: 380 Ohm
Lead Channel Pacing Threshold Amplitude: 0.625 V
Lead Channel Pacing Threshold Amplitude: 0.625 V
Lead Channel Pacing Threshold Pulse Width: 0.4 ms
Lead Channel Sensing Intrinsic Amplitude: 1 mV
Lead Channel Sensing Intrinsic Amplitude: 1 mV
Lead Channel Sensing Intrinsic Amplitude: 17.625 mV
Lead Channel Setting Pacing Amplitude: 2.5 V
MDC IDC LEAD IMPLANT DT: 20180424
MDC IDC LEAD LOCATION: 753860
MDC IDC MSMT LEADCHNL RA PACING THRESHOLD PULSEWIDTH: 0.4 ms
MDC IDC MSMT LEADCHNL RV IMPEDANCE VALUE: 342 Ohm
MDC IDC MSMT LEADCHNL RV IMPEDANCE VALUE: 399 Ohm
MDC IDC MSMT LEADCHNL RV SENSING INTR AMPL: 17.625 mV
MDC IDC PG IMPLANT DT: 20180424
MDC IDC SESS DTM: 20190930053223
MDC IDC SET LEADCHNL RA PACING AMPLITUDE: 1.5 V
MDC IDC SET LEADCHNL RV PACING PULSEWIDTH: 0.4 ms
MDC IDC SET LEADCHNL RV SENSING SENSITIVITY: 2 mV
MDC IDC STAT BRADY AP VP PERCENT: 25.66 %
MDC IDC STAT BRADY AS VP PERCENT: 68.97 %
MDC IDC STAT BRADY RA PERCENT PACED: 29.84 %

## 2018-07-03 DIAGNOSIS — R1310 Dysphagia, unspecified: Secondary | ICD-10-CM | POA: Diagnosis not present

## 2018-07-03 DIAGNOSIS — R252 Cramp and spasm: Secondary | ICD-10-CM | POA: Diagnosis not present

## 2018-07-03 DIAGNOSIS — R94131 Abnormal electromyogram [EMG]: Secondary | ICD-10-CM | POA: Diagnosis not present

## 2018-07-03 DIAGNOSIS — R269 Unspecified abnormalities of gait and mobility: Secondary | ICD-10-CM | POA: Diagnosis not present

## 2018-07-03 DIAGNOSIS — R471 Dysarthria and anarthria: Secondary | ICD-10-CM | POA: Diagnosis not present

## 2018-07-08 ENCOUNTER — Ambulatory Visit: Payer: Medicare HMO | Admitting: Rehabilitation

## 2018-07-10 ENCOUNTER — Ambulatory Visit: Payer: Medicare HMO | Admitting: Occupational Therapy

## 2018-07-14 ENCOUNTER — Encounter (HOSPITAL_COMMUNITY): Payer: Self-pay

## 2018-07-14 ENCOUNTER — Emergency Department (HOSPITAL_COMMUNITY): Payer: Medicare HMO

## 2018-07-14 ENCOUNTER — Other Ambulatory Visit: Payer: Self-pay

## 2018-07-14 ENCOUNTER — Emergency Department (HOSPITAL_COMMUNITY)
Admission: EM | Admit: 2018-07-14 | Discharge: 2018-07-15 | Disposition: A | Discharge status: Home / Self Care | Payer: Medicare HMO | Attending: Emergency Medicine | Admitting: Emergency Medicine

## 2018-07-14 DIAGNOSIS — N183 Chronic kidney disease, stage 3 (moderate): Secondary | ICD-10-CM | POA: Diagnosis not present

## 2018-07-14 DIAGNOSIS — I10 Essential (primary) hypertension: Secondary | ICD-10-CM | POA: Diagnosis not present

## 2018-07-14 DIAGNOSIS — Z7982 Long term (current) use of aspirin: Secondary | ICD-10-CM | POA: Insufficient documentation

## 2018-07-14 DIAGNOSIS — Z8673 Personal history of transient ischemic attack (TIA), and cerebral infarction without residual deficits: Secondary | ICD-10-CM | POA: Diagnosis not present

## 2018-07-14 DIAGNOSIS — R4182 Altered mental status, unspecified: Secondary | ICD-10-CM | POA: Diagnosis not present

## 2018-07-14 DIAGNOSIS — F17210 Nicotine dependence, cigarettes, uncomplicated: Secondary | ICD-10-CM | POA: Insufficient documentation

## 2018-07-14 DIAGNOSIS — R569 Unspecified convulsions: Secondary | ICD-10-CM | POA: Diagnosis not present

## 2018-07-14 DIAGNOSIS — Z950 Presence of cardiac pacemaker: Secondary | ICD-10-CM | POA: Diagnosis not present

## 2018-07-14 DIAGNOSIS — N189 Chronic kidney disease, unspecified: Secondary | ICD-10-CM

## 2018-07-14 DIAGNOSIS — Z79899 Other long term (current) drug therapy: Secondary | ICD-10-CM | POA: Diagnosis not present

## 2018-07-14 DIAGNOSIS — I129 Hypertensive chronic kidney disease with stage 1 through stage 4 chronic kidney disease, or unspecified chronic kidney disease: Secondary | ICD-10-CM | POA: Diagnosis not present

## 2018-07-14 DIAGNOSIS — R69 Illness, unspecified: Secondary | ICD-10-CM | POA: Diagnosis not present

## 2018-07-14 LAB — CBC WITH DIFFERENTIAL/PLATELET
Abs Immature Granulocytes: 0.06 10*3/uL (ref 0.00–0.07)
Basophils Absolute: 0 10*3/uL (ref 0.0–0.1)
Basophils Relative: 0 %
EOS ABS: 0.2 10*3/uL (ref 0.0–0.5)
EOS PCT: 2 %
HEMATOCRIT: 39.8 % (ref 39.0–52.0)
Hemoglobin: 12.6 g/dL — ABNORMAL LOW (ref 13.0–17.0)
Immature Granulocytes: 0 %
LYMPHS ABS: 1.7 10*3/uL (ref 0.7–4.0)
Lymphocytes Relative: 11 %
MCH: 29.2 pg (ref 26.0–34.0)
MCHC: 31.7 g/dL (ref 30.0–36.0)
MCV: 92.3 fL (ref 80.0–100.0)
MONO ABS: 0.9 10*3/uL (ref 0.1–1.0)
MONOS PCT: 6 %
Neutro Abs: 11.9 10*3/uL — ABNORMAL HIGH (ref 1.7–7.7)
Neutrophils Relative %: 81 %
Platelets: 266 10*3/uL (ref 150–400)
RBC: 4.31 MIL/uL (ref 4.22–5.81)
RDW: 14.4 % (ref 11.5–15.5)
WBC: 14.9 10*3/uL — ABNORMAL HIGH (ref 4.0–10.5)
nRBC: 0 % (ref 0.0–0.2)

## 2018-07-14 MED ORDER — LORAZEPAM 2 MG/ML IJ SOLN
1.0000 mg | Freq: Once | INTRAMUSCULAR | Status: AC
  Administered 2018-07-14: 1 mg via INTRAVENOUS
  Filled 2018-07-14: qty 1

## 2018-07-14 NOTE — ED Notes (Signed)
Bed: WA17 Expected date:  Expected time:  Means of arrival:  Comments: 67 yo M/ Seizure

## 2018-07-14 NOTE — ED Triage Notes (Signed)
Per EMS, patient coming from home with witnessed seizure x2. Pt was alert between seizures and after.   Had one witnessed seizure with EMS that lasted about 45 seconds. 2.5 mg IV Midazolam given. Patient has been sleeping since medication administered. There is evidence of oral trama and patient was incontinent of urine.

## 2018-07-14 NOTE — ED Notes (Signed)
Family at bedside. Wife reports that patient has a history of seizures and is being treated for them currently. Last seizure before this one was about a year ago. Patient was sitting on side of bed when wife noticed that something seemed off. He fell back into the bed and seized, did not fall and did not hit head during or after seizure.

## 2018-07-14 NOTE — ED Notes (Signed)
Patient transported to CT 

## 2018-07-14 NOTE — ED Provider Notes (Signed)
[place] DEPT Provider Note   CSN: [id number] Arrival date & time: 07/14/18  2111     History   Chief Complaint Chief Complaint  Patient presents with  . Seizures    HPI Darrell [name] is a 67 y.o. male.  Patient is a 67 year old male with a history of prior seizure who presents after having a seizure.  Per patient's wife, patient had a stroke in 2011 or 2012.  He subsequently had a seizure about 1 to 2 years ago.  That was his first seizure and he was placed on Depakote.  He is continued to take Depakote and has not had any seizures since that time.  He was sitting on the edge of his bed today and his wife states he started to slump over and fell back on the bed and had generalized tonic-clonic type activity.  This lasted about 3 minutes per her report.  She does think he bit his tongue.  He did not fall off the bed.  She denies any has had any recent traumatic injuries or falls.  She denies any recent illnesses.  He is been acting at his baseline mental status prior to this event.  He was given Versed per EMS.  She states that he still not back to his baseline mental status.  He has chronic weakness in his left arm and left leg from his prior stroke.  He ambulates with assistance with a walker or a wheelchair.  She states he has a little bit of slow speech but is normally completely oriented at baseline.     Past Medical History:  Diagnosis Date  . Arthritis   . At high risk for falls 08/16/2015  . Bradycardia   . Chronic kidney disease    stage 3 GFR 30-59 ml/min   . CIDP (chronic inflammatory demyelinating polyneuropathy) ([place])   . CKD (chronic kidney disease) stage 3, GFR 30-59 ml/min ([name]) 08/16/2015  . Dysphagia as late effect of cerebrovascular disease    pts wife states pt has to eat soft foods   . Elevated liver enzymes 08/10/2016  . GERD (gastroesophageal reflux disease)   . Glaucoma   . High cholesterol   . History of CVA with  residual deficit 03/25/2013  . Hypertension   . Hypertensive retinopathy of both eyes 01/16/2017  . Inguinal hernia 03/25/2013  . Liver hemangioma 08/14/2016  . Neuromuscular disorder ([place])    chronic inflammatory demyelinating polyneuropathy   . New onset seizure ([place]) 07/08/2017  . Nuclear sclerosis of both eyes 10/25/2016  . Presence of permanent cardiac pacemaker    placed in april 2018  . Primary open angle glaucoma of both eyes, indeterminate stage 10/25/2016  . Renal mass, right 08/14/2016  . Status cardiac pacemaker 01/29/2017   Placed for second degree heart block on 01/16/17 Medtronic Azure XT DR MRI SureScan dual-chamber pacemaker  . Stroke [facility])    2011 with residual deficit left sided weakness  . Tobacco dependence     Patient Active Problem List   Diagnosis Date Noted  . GERD (gastroesophageal reflux disease) 07/21/2017  . Hyperlipidemia 07/21/2017  . CIDP (chronic inflammatory demyelinating polyneuropathy) ([place])   . Bradycardia   . Acute blood loss anemia   . New onset seizure ([place]) 07/08/2017  . Status cardiac pacemaker 01/29/2017  . Unintended weight loss 01/29/2017  . Tinea pedis of right foot 01/29/2017  . Hypertensive retinopathy of both eyes 01/16/2017  . Hypercalcemia 01/15/2017  . Heart block 01/15/2017  .  Nuclear sclerosis of both eyes 10/25/2016  . Primary open angle glaucoma of both eyes, indeterminate stage 10/25/2016  . Constipation 09/12/2016  . Glaucoma 09/12/2016  . Liver hemangioma 08/14/2016  . Renal cyst 08/14/2016  . Renal mass, right 08/14/2016  . Elevated liver enzymes 08/10/2016  . CKD (chronic kidney disease) stage 3, GFR 30-59 ml/min ([name]) 08/16/2015  . At high risk for falls 08/16/2015  . Subcutaneous nodules 08/16/2015  . Prediabetes 10/07/2013  . Inguinal hernia 03/25/2013  . History of CVA with residual deficit 03/25/2013  . HTN (hypertension) 10/09/2012  . Tobacco abuse 10/09/2012    Past Surgical History:  Procedure Laterality  Date  . EYE SURGERY    . HERNIA REPAIR    . INGUINAL HERNIA REPAIR Right 11/23/2014   Procedure: right inguinal hernia repair with mesh;  Surgeon: Armandina Gemma, MD;  [facility];  Service: General;  Laterality: Right;  . INSERTION OF MESH N/A 11/23/2014   Procedure: INSERTION OF MESH;  Surgeon: Armandina Gemma, MD;  [facility];  Service: General;  Laterality: N/A;  . MASS EXCISION Left 08/29/2017   Procedure: EXCISION OF LEFT NECK MASS;  Surgeon: Coralie Keens, MD;  Location: [place];  Service: General;  Laterality: Left;  . PACEMAKER IMPLANT N/A 01/16/2017   Procedure: Pacemaker Implant;  Surgeon: Will Meredith Leeds, MD;  Location: [place] [facility];  Service: Cardiovascular;  Laterality: N/A;  . SHOULDER SURGERY Bilateral 1988, 1998        Home Medications    Prior to Admission medications   Medication Sig Start Date End Date Taking? Authorizing Provider  acetaminophen (TYLENOL) 325 MG tablet Take 325 mg by mouth every 6 (six) hours as needed for mild pain or headache. Take 2 tablets every 6 hours as needed for fever greater than 101.     [provider]  amLODipine (NORVASC) 10 MG tablet Take 1 tablet (10 mg total) by mouth every morning. 06/13/18   [name], MD  aspirin EC 81 MG tablet Take 1 tablet (81 mg total) by mouth daily. 02/20/17   Funches, Adriana Mccallum, MD  atorvastatin (LIPITOR) 40 MG tablet Take 1 tablet (40 mg total) by mouth daily. 06/13/18   [name], MD  brimonidine (ALPHAGAN P) 0.1 % SOLN Place 1 drop into both eyes daily.     [provider]  divalproex (DEPAKOTE) 500 MG DR tablet Take 1 tablet (500 mg total) by mouth 2 (two) times daily. 03/19/18   Penumalli, Earlean Polka, MD  dorzolamide-timolol (COSOPT) 22.3-6.8 MG/ML ophthalmic solution Place 1 drop into both eyes daily.     [provider]  fluticasone (FLONASE) 50 MCG/ACT nasal spray Place 2 sprays into both nostrils daily. 09/12/16   Funches, Adriana Mccallum, MD  folic acid (FOLVITE)  1 MG tablet Take 1 tablet (1 mg total) by mouth daily. 08/15/17   Argentina Donovan, PA-C  hydrALAZINE (APRESOLINE) 50 MG tablet Take 1 tablet (50 mg total) by mouth 3 (three) times daily. 06/13/18   [name], MD  ketoconazole (NIZORAL) 2 % cream Apply 1 application topically daily as needed for irritation.    [provider]  latanoprost (XALATAN) 0.005 % ophthalmic solution Place 1 drop into both eyes at bedtime.    [provider]  lisinopril (PRINIVIL,ZESTRIL) 10 MG tablet Take 1 tablet (10 mg total) by mouth daily. 06/13/18   [name], MD  metoprolol tartrate (LOPRESSOR) 50 MG tablet Take 1 tablet (50 mg total) by mouth 2 (two) times daily.  05/08/18   Camnitz, Ocie Doyne, MD  Multiple Vitamins-Iron (DAILY VITAMIN FORMULA+IRON) TABS Take 1 tablet by mouth daily.    [provider]  polyethylene glycol (MIRALAX / GLYCOLAX) packet Take 17 g by mouth daily as needed for mild constipation. 03/07/18   [name], MD  Sennosides-Docusate Sodium (SENNA S PO) Take 1 tablet by mouth daily as needed (constipation).     [provider]  terbinafine (LAMISIL AT) 1 % cream Apply 1 application topically 2 (two) times daily. To R foot Patient taking differently: Apply 1 application topically daily as needed (rash).  01/29/17   Funches, Adriana Mccallum, MD  thiamine 100 MG tablet Take 1 tablet (100 mg total) by mouth daily. 07/12/17   Mikhail, Velta Addison, DO  torsemide (DEMADEX) 20 MG tablet Take 20 mg by mouth daily.    [provider]    Family History Family History  Problem Relation Age of Onset  . Hypertension Mother   . Diabetes Sister   . Hypertension Sister   . Cancer Sister        1 sister  . COPD Sister        in 1 sister    Social History Social History   Tobacco Use  . Smoking status: Current Every Day Smoker    Packs/day: 1.00    Years: 40.00    Pack years: 40.00    Types: Cigarettes  . Smokeless tobacco: Never Used  . Tobacco  comment: 09/13/17 little < 1 PPD  Substance Use Topics  . Alcohol use: No    Comment: alcohol free for 1 year was drinking 1/2 pint per day   . Drug use: No     Allergies   Doxycycline; Atacand hct [candesartan cilexetil-hctz]; and Shellfish allergy   Review of Systems Review of Systems  Unable to perform ROS: Mental status change     Physical Exam Updated Vital Signs BP (!) 146/90 (BP Location: Left Arm)   Pulse 92   Temp 97.8 F (36.6 C) (Axillary)   Resp 18   Ht 6\' 2"  (1.88 m)   Wt 77.1 kg   SpO2 96%   BMI 21.83 kg/m   Physical Exam  Constitutional: He appears well-developed and well-nourished.  HENT:  Head: Normocephalic and atraumatic.  Eyes: Pupils are equal, round, and reactive to light.  Neck: Normal range of motion. Neck supple.  Cardiovascular: Normal rate, regular rhythm and normal heart sounds.  Pulmonary/Chest: Effort normal and breath sounds normal. No respiratory distress. He has no wheezes. He has no rales. He exhibits no tenderness.  Abdominal: Soft. Bowel sounds are normal. There is no tenderness. There is no rebound and no guarding.  Musculoskeletal: Normal range of motion. He exhibits no edema.  Lymphadenopathy:    He has no cervical adenopathy.  Neurological: He is alert.  Patient is alert with eyes open.  He is agitated and is fidgeting at everything.  He has hemiparesis of his left side.  Wife states this is baseline.  His pupils are symmetric.  There is no obvious facial drooping.  He has good strength in his right arm and right leg but is not following commands.  Skin: Skin is warm and dry. No rash noted.  Psychiatric: He has a normal mood and affect.     ED Treatments / Results  Labs (all labs ordered are listed, but only abnormal results are displayed) Labs Reviewed  CBC WITH DIFFERENTIAL/PLATELET - Abnormal; Notable for the following components:  Result Value   WBC 14.9 (*)    Hemoglobin 12.6 (*)    Neutro Abs 11.9 (*)    All  other components within normal limits  BASIC METABOLIC PANEL  VALPROIC ACID LEVEL    EKG EKG Interpretation  Date/Time:  Sunday July 14 2018 22:20:23 EDT Ventricular Rate:  79 PR Interval:    QRS Duration: 101 QT Interval:  393 QTC Calculation: 451 R Axis:   -19 Text Interpretation:  Failure to sense and/or capture (?magnet) No further analysis attempted due to paced rhythm since last tracing no significant change Confirmed by Malvin Johns [phone]) on 07/14/2018 11:00:44 PM   Radiology Ct Head Wo Contrast  Result Date: 07/14/2018 CLINICAL DATA:  Altered mental status EXAM: CT HEAD WITHOUT CONTRAST TECHNIQUE: Contiguous axial images were obtained from the base of the skull through the vertex without intravenous contrast. COMPARISON:  Head CT 07/07/2017 FINDINGS: Brain: There is no mass, hemorrhage or extra-axial collection. There is generalized atrophy without lobar predilection. There is an old right PCA territory infarct and multiple old gangliothalamic small vessel infarcts. There is hypoattenuation of the periventricular white matter, most commonly indicating chronic ischemic microangiopathy. Vascular: No abnormal hyperdensity of the major intracranial arteries or dural venous sinuses. No intracranial atherosclerosis. Skull: The visualized skull base, calvarium and extracranial soft tissues are normal. Sinuses/Orbits: No fluid levels or advanced mucosal thickening of the visualized paranasal sinuses. No mastoid or middle ear effusion. The orbits are normal. IMPRESSION: 1. No acute intracranial abnormality. 2. Chronic ischemic microangiopathy and multiple old infarcts, including old right PCA territory infarct. Electronically Signed   By: Ulyses Jarred M.D.   On: 07/14/2018 23:52    Procedures Procedures (including critical care time)  Medications Ordered in ED Medications  LORazepam (ATIVAN) injection 1 mg (1 mg Intravenous Given 07/14/18 2310)     Initial Impression /  Assessment and Plan / ED Course  I have reviewed the triage vital signs and the nursing notes.  Pertinent labs & imaging results that were available during my care of the patient were reviewed by me and considered in my medical decision making (see chart for details).     Patient is a 67 year old male who presents after a seizure.  He has had one prior seizure.  He is on Depakote.  His wife states he has not missed any doses.  He does not have any apparent injuries from the seizure.  My evaluation, he was still fairly confused which is changed from his baseline.  Head CT was performed which shows no acute abnormalities.  His labs are pending.  On reexam, his wife states that he is improving and is much less agitated than he was previously.  He is starting to become more alert.  Will need further monitoring and can likely be discharged if he returns to baseline.  Care turned over to Dr. Kathrynn Humble  Final Clinical Impressions(s) / ED Diagnoses   Final diagnoses:  Seizure [facility])    ED Discharge Orders    None       Malvin Johns, MD 07/15/18 0002

## 2018-07-15 DIAGNOSIS — Z79899 Other long term (current) drug therapy: Secondary | ICD-10-CM | POA: Diagnosis not present

## 2018-07-15 DIAGNOSIS — Z7982 Long term (current) use of aspirin: Secondary | ICD-10-CM | POA: Diagnosis not present

## 2018-07-15 DIAGNOSIS — Z8673 Personal history of transient ischemic attack (TIA), and cerebral infarction without residual deficits: Secondary | ICD-10-CM | POA: Diagnosis not present

## 2018-07-15 DIAGNOSIS — R69 Illness, unspecified: Secondary | ICD-10-CM | POA: Diagnosis not present

## 2018-07-15 DIAGNOSIS — I129 Hypertensive chronic kidney disease with stage 1 through stage 4 chronic kidney disease, or unspecified chronic kidney disease: Secondary | ICD-10-CM | POA: Diagnosis not present

## 2018-07-15 DIAGNOSIS — R569 Unspecified convulsions: Secondary | ICD-10-CM | POA: Diagnosis not present

## 2018-07-15 DIAGNOSIS — N183 Chronic kidney disease, stage 3 (moderate): Secondary | ICD-10-CM | POA: Diagnosis not present

## 2018-07-15 DIAGNOSIS — Z950 Presence of cardiac pacemaker: Secondary | ICD-10-CM | POA: Diagnosis not present

## 2018-07-15 LAB — BASIC METABOLIC PANEL
Anion gap: 9 (ref 5–15)
BUN: 26 mg/dL — AB (ref 8–23)
CO2: 27 mmol/L (ref 22–32)
CREATININE: 2.09 mg/dL — AB (ref 0.61–1.24)
Calcium: 10.3 mg/dL (ref 8.9–10.3)
Chloride: 105 mmol/L (ref 98–111)
GFR calc Af Amer: 36 mL/min — ABNORMAL LOW (ref >60)
GFR, EST NON AFRICAN AMERICAN: 31 mL/min — AB (ref >60)
Glucose, Bld: 125 mg/dL — ABNORMAL HIGH (ref 70–99)
POTASSIUM: 4.3 mmol/L (ref 3.5–5.1)
Sodium: 141 mmol/L (ref 135–145)

## 2018-07-15 LAB — VALPROIC ACID LEVEL: Valproic Acid Lvl: <10 ug/mL — ABNORMAL LOW (ref 50.0–100.0)

## 2018-07-15 MED ORDER — DIVALPROEX SODIUM ER 500 MG PO TB24
1000.0000 mg | ORAL_TABLET | Freq: Every day | ORAL | Status: DC
  Administered 2018-07-15: 1000 mg via ORAL
  Filled 2018-07-15: qty 2

## 2018-07-15 NOTE — ED Provider Notes (Addendum)
  Physical Exam  BP (!) 146/90 (BP Location: Left Arm)   Pulse 92   Temp 97.8 F (36.6 C) (Axillary)   Resp 18   Ht 6\' 2"  (1.88 m)   Wt 77.1 kg   SpO2 96%   BMI 21.83 kg/m   Physical Exam  ED Course/Procedures     Procedures  MDM   Assuming care of patient from Dr. Tamera Punt   Patient in the ED for seizures.  Generalized convulsion - last episode was a couple of years back. Workup thus far shows no acute findings, but labs are pending.  Concerning findings are as following: none Important pending results are labs and CT.  According to Dr. Tamera Punt, plan is to f/u on the lab results.   Patient had no complains, no concerns from the nursing side. Will continue to monitor.    Varney Biles, MD 07/15/18 0006  2:00 AM Depakote level is subtherapeutic. Patient has been given thousand milligrams oral loading dose. Labs show that he has persistent elevated creatinine, which is slightly higher than his last visit.  Results from the ER have been discussed with patient and his wife.  Patient is not returning to baseline but is still feeling sleepy. No seizure-like episode since in the ED.  Stable for discharge.  Wife has been advised to check on him in the middle the night to ensure there is no repeat episode of seizure.   Varney Biles, MD 07/15/18 [phone]

## 2018-07-15 NOTE — Discharge Instructions (Addendum)
We saw in the ER for seizure.  In the ER, your Depakote level was low.  That tells [place] that you might not be taking her medication properly or the medications need to be adjusted. If you are indeed taking her medications as prescribed, then it is important that you follow-up with your neurologist to discuss the seizures and the lab results from today.  We also request that you be checked on in the middle the night tonight to ensure that you are not having any repeat episodes.Marland Kitchen

## 2018-07-16 ENCOUNTER — Encounter: Payer: Self-pay | Admitting: Cardiology

## 2018-07-16 ENCOUNTER — Ambulatory Visit: Payer: Medicare HMO | Admitting: Occupational Therapy

## 2018-07-16 ENCOUNTER — Ambulatory Visit (INDEPENDENT_AMBULATORY_CARE_PROVIDER_SITE_OTHER): Payer: Medicare HMO | Admitting: Family Medicine

## 2018-07-16 ENCOUNTER — Ambulatory Visit (INDEPENDENT_AMBULATORY_CARE_PROVIDER_SITE_OTHER): Payer: Medicare HMO | Admitting: Cardiology

## 2018-07-16 ENCOUNTER — Encounter: Payer: Self-pay | Admitting: Family Medicine

## 2018-07-16 ENCOUNTER — Ambulatory Visit: Payer: Medicare HMO | Admitting: Physical Therapy

## 2018-07-16 ENCOUNTER — Telehealth: Payer: Self-pay | Admitting: *Deleted

## 2018-07-16 VITALS — BP 136/72 | HR 60 | Ht 74.0 in | Wt 170.0 lb

## 2018-07-16 VITALS — BP 120/76 | HR 60 | Temp 98.0°F | Resp 17 | Ht 74.0 in | Wt 171.0 lb

## 2018-07-16 DIAGNOSIS — G40909 Epilepsy, unspecified, not intractable, without status epilepticus: Secondary | ICD-10-CM | POA: Diagnosis not present

## 2018-07-16 DIAGNOSIS — I441 Atrioventricular block, second degree: Secondary | ICD-10-CM | POA: Diagnosis not present

## 2018-07-16 DIAGNOSIS — R252 Cramp and spasm: Secondary | ICD-10-CM

## 2018-07-16 DIAGNOSIS — I10 Essential (primary) hypertension: Secondary | ICD-10-CM | POA: Diagnosis not present

## 2018-07-16 DIAGNOSIS — G6181 Chronic inflammatory demyelinating polyneuritis: Secondary | ICD-10-CM

## 2018-07-16 DIAGNOSIS — Z7409 Other reduced mobility: Secondary | ICD-10-CM

## 2018-07-16 NOTE — Progress Notes (Signed)
Electrophysiology Office Note   Date:  07/16/2018   ID:  Darrell [name], DOB 09-09-1951, MRN [id number]  PCP:  [name], MD  Cardiologist:  none Primary Electrophysiologist:  Darrell Aikey Meredith Leeds, MD    No chief complaint on file.    History of Present Illness: Darrell [name] is a 67 y.o. male who is being seen today for the evaluation of heart block at the request of [name], MD. Presenting today for electrophysiology evaluation. Presented to the hospital when he discovered his HR was in the 40s, found to be in heart block with 2:1 conduction. Medtronic dual chamber pacemaker implanted 01/16/17.  Today, denies symptoms of palpitations, chest pain, shortness of breath, orthopnea, PND, lower extremity edema, claudication, dizziness, presyncope, syncope, bleeding, or neurologic sequela. The patient is tolerating medications without difficulties.  He is currently feeling well without major issue today.  He has had multiple hospitalizations over the past 4 convulsions that were thought to be seizures.  He does have follow-up scheduled with neurology.  Past Medical History:  Diagnosis Date  . Arthritis   . At high risk for falls 08/16/2015  . Bradycardia   . Chronic kidney disease    stage 3 GFR 30-59 ml/min   . CIDP (chronic inflammatory demyelinating polyneuropathy) ([place])   . CKD (chronic kidney disease) stage 3, GFR 30-59 ml/min ([name]) 08/16/2015  . Dysphagia as late effect of cerebrovascular disease    pts wife states pt has to eat soft foods   . Elevated liver enzymes 08/10/2016  . GERD (gastroesophageal reflux disease)   . Glaucoma   . High cholesterol   . History of CVA with residual deficit 03/25/2013  . Hypertension   . Hypertensive retinopathy of both eyes 01/16/2017  . Inguinal hernia 03/25/2013  . Liver hemangioma 08/14/2016  . Neuromuscular disorder ([place])    chronic inflammatory demyelinating polyneuropathy   . New onset seizure ([place]) 07/08/2017  . Nuclear  sclerosis of both eyes 10/25/2016  . Presence of permanent cardiac pacemaker    placed in april 2018  . Primary open angle glaucoma of both eyes, indeterminate stage 10/25/2016  . Renal mass, right 08/14/2016  . Status cardiac pacemaker 01/29/2017   Placed for second degree heart block on 01/16/17 Medtronic Azure XT DR MRI SureScan dual-chamber pacemaker  . Stroke [facility])    2011 with residual deficit left sided weakness  . Tobacco dependence    Past Surgical History:  Procedure Laterality Date  . EYE SURGERY    . HERNIA REPAIR    . INGUINAL HERNIA REPAIR Right 11/23/2014   Procedure: right inguinal hernia repair with mesh;  Surgeon: Armandina Gemma, MD;  Location: [facility];  Service: General;  Laterality: Right;  . INSERTION OF MESH N/A 11/23/2014   Procedure: INSERTION OF MESH;  Surgeon: Armandina Gemma, MD;  Location: [facility];  Service: General;  Laterality: N/A;  . MASS EXCISION Left 08/29/2017   Procedure: EXCISION OF LEFT NECK MASS;  Surgeon: Coralie Keens, MD;  Location: [place];  Service: General;  Laterality: Left;  . PACEMAKER IMPLANT N/A 01/16/2017   Procedure: Pacemaker Implant;  Surgeon: Myishia Kasik Meredith Leeds, MD;  Location: [place] [facility];  Service: Cardiovascular;  Laterality: N/A;  . SHOULDER SURGERY Bilateral 1988, 1998     Current Outpatient Medications  Medication Sig Dispense Refill  . amLODipine (NORVASC) 10 MG tablet Take 1 tablet (10 mg total) by mouth every morning. 90 tablet 1  . aspirin EC 81 MG tablet Take  1 tablet (81 mg total) by mouth daily. 90 tablet 3  . atorvastatin (LIPITOR) 40 MG tablet Take 1 tablet (40 mg total) by mouth daily. 90 tablet 1  . brimonidine (ALPHAGAN P) 0.1 % SOLN Place 1 drop into both eyes daily.     . divalproex (DEPAKOTE) 500 MG DR tablet Take 1 tablet (500 mg total) by mouth 2 (two) times daily. 180 tablet 4  . dorzolamide-timolol (COSOPT) 22.3-6.8 MG/ML ophthalmic solution Place 1 drop into both eyes daily.     . fluticasone (FLONASE) 50  MCG/ACT nasal spray Place 2 sprays into both nostrils daily. 16 g 6  . folic acid (FOLVITE) 1 MG tablet Take 1 tablet (1 mg total) by mouth daily. 90 tablet 1  . hydrALAZINE (APRESOLINE) 50 MG tablet Take 1 tablet (50 mg total) by mouth 3 (three) times daily. 270 tablet 1  . ketoconazole (NIZORAL) 2 % cream Apply 1 application topically daily as needed for irritation.    Marland Kitchen latanoprost (XALATAN) 0.005 % ophthalmic solution Place 1 drop into both eyes at bedtime.    Marland Kitchen lisinopril (PRINIVIL,ZESTRIL) 10 MG tablet Take 1 tablet (10 mg total) by mouth daily. 90 tablet 1  . metoprolol tartrate (LOPRESSOR) 50 MG tablet Take 1 tablet (50 mg total) by mouth 2 (two) times daily. 180 tablet 2  . Multiple Vitamins-Iron (DAILY VITAMIN FORMULA+IRON) TABS Take 1 tablet by mouth daily.    . polyethylene glycol (MIRALAX / GLYCOLAX) packet Take 17 g by mouth daily as needed for mild constipation. 30 each 3  . Sennosides-Docusate Sodium (SENNA S PO) Take 1 tablet by mouth daily as needed (constipation).     . terbinafine (LAMISIL AT) 1 % cream Apply 1 application topically 2 (two) times daily. To R foot (Patient taking differently: Apply 1 application topically daily as needed (rash). ) 30 g 0  . thiamine 100 MG tablet Take 1 tablet (100 mg total) by mouth daily. 30 tablet 0  . torsemide (DEMADEX) 20 MG tablet Take 20 mg by mouth daily.    Marland Kitchen acetaminophen (TYLENOL) 325 MG tablet Take 325 mg by mouth every 6 (six) hours as needed for mild pain or headache. Take 2 tablets every 6 hours as needed for fever greater than 101.      No current facility-administered medications for this visit.     Allergies:   Ace inhibitors; Doxycycline; Atacand hct [candesartan cilexetil-hctz]; and Shellfish allergy   Social History:  The patient  reports that he has been smoking cigarettes. He has a 40.00 pack-year smoking history. He has never used smokeless tobacco. He reports that he does not drink alcohol or use drugs.   Family  History:  The patient's family history includes COPD in his sister; Cancer in his sister; Diabetes in his sister; Hypertension in his mother and sister.    ROS:  Please see the history of present illness.   Otherwise, review of systems is positive for weight loss, appetite change, chills, visual changes, cough, snoring, constipation, anxiety.   All other systems are reviewed and negative.   PHYSICAL EXAM: VS:  BP 136/72   Pulse 60   Ht 6\' 2"  (1.88 m)   Wt 170 lb (77.1 kg)   BMI 21.83 kg/m  , BMI Body mass index is 21.83 kg/m. GEN: Well nourished, well developed, in no acute distress  HEENT: normal  Neck: no JVD, carotid bruits, or masses Cardiac: RRR; no murmurs, rubs, or gallops,no edema  Respiratory:  clear to auscultation  bilaterally, normal work of breathing GI: soft, nontender, nondistended, + BS MS: no deformity or atrophy  Skin: warm and dry, device site well healed Neuro:  Strength and sensation are intact Psych: euthymic mood, full affect  EKG:  EKG is not ordered today. Personal review of the ekg ordered 07/14/18 shows sinus rhythm, rate 79  Personal review of the device interrogation today. Results in [place]: 06/13/2018: ALT 6; TSH 1.600 07/14/2018: BUN 26; Creatinine, Ser 2.09; Hemoglobin 12.6; Platelets 266; Potassium 4.3; Sodium 141    Lipid Panel     Component Value Date/Time   CHOL 112 06/13/2018 0954   TRIG 51 06/13/2018 0954   HDL 40 06/13/2018 0954   CHOLHDL 2.8 06/13/2018 0954   CHOLHDL 4.1 08/16/2015 1555   VLDL 23 08/16/2015 1555   LDLCALC 62 06/13/2018 0954     Wt Readings from Last 3 Encounters:  07/16/18 170 lb (77.1 kg)  07/14/18 170 lb (77.1 kg)  06/13/18 162 lb 12.8 oz (73.8 kg)      Other studies Reviewed: Additional studies/ records that were reviewed today include: TTE 2014  Review of the above records today demonstrates:  - Left ventricle: The cavity size was normal. There was severe concentric and mild  asymmetric hypertrophy. Systolic function was normal. Wall motion was normal; there were no regional wall motion abnormalities. Doppler parameters are consistent with abnormal left ventricular relaxation (grade 1 diastolic dysfunction). - Aortic root: The aortic root was mildly dilated. - Right atrium: The atrium was mildly dilated. - Pulmonary arteries: Systolic pressure was mildly increased. PA peak pressure: 17mm Hg (S).  ASSESSMENT AND PLAN:  1.  2:1 AV block: Status post Medtronic dual-chamber pacemaker implanted 01/16/2017.  Is functioning appropriately.  No changes at this time.  2. Hypertension: Mildly elevated today but has been well controlled in the past.  No changes.  Current medicines are reviewed at length with the patient today.   The patient does not have concerns regarding his medicines.  The following changes were made today: None  Labs/ tests ordered today include:  No orders of the defined types were placed in this encounter.    Disposition:   FU with Darrell [name] 12 months  Signed, Darrell Batch Meredith Leeds, MD  07/16/2018 8:27 AM     [facility] [address] [place] [address] [phone] (office) [phone] (fax)    Electrophysiology Office Note   Date:  07/16/2018   ID:  Darrell [name], Darrell [name] [birth year], MRN [id number]  PCP:  [name], MD  Cardiologist:  none Primary Electrophysiologist:  Darrell Speece Meredith Leeds, MD    No chief complaint on file.    History of Present Illness: Darrell [name] is a 67 y.o. male who is being seen today for the evaluation of heart block at the request of [name], MD. Presenting today for electrophysiology evaluation. Presented to the hospital when he discovered his HR was in the 40s, found to be in heart block with 2:1 conduction. Medtronic dual chamber pacemaker implanted 01/16/17.  Today, denies symptoms of palpitations, chest pain, shortness of breath, orthopnea,  PND, lower extremity edema, claudication, dizziness, presyncope, syncope, bleeding, or neurologic sequela. The patient is tolerating medications without difficulties.  Feeling well today without complaints.  He has had 2 emergency room visits for convulsions.  His Depakote dose was adjusted.  He does have follow-up with neuro.  Past Medical History:  Diagnosis Date  . Arthritis   . At  high risk for falls 08/16/2015  . Bradycardia   . Chronic kidney disease    stage 3 GFR 30-59 ml/min   . CIDP (chronic inflammatory demyelinating polyneuropathy) ([place])   . CKD (chronic kidney disease) stage 3, GFR 30-59 ml/min ([name]) 08/16/2015  . Dysphagia as late effect of cerebrovascular disease    pts wife states pt has to eat soft foods   . Elevated liver enzymes 08/10/2016  . GERD (gastroesophageal reflux disease)   . Glaucoma   . High cholesterol   . History of CVA with residual deficit 03/25/2013  . Hypertension   . Hypertensive retinopathy of both eyes 01/16/2017  . Inguinal hernia 03/25/2013  . Liver hemangioma 08/14/2016  . Neuromuscular disorder ([place])    chronic inflammatory demyelinating polyneuropathy   . New onset seizure ([place]) 07/08/2017  . Nuclear sclerosis of both eyes 10/25/2016  . Presence of permanent cardiac pacemaker    placed in april 2018  . Primary open angle glaucoma of both eyes, indeterminate stage 10/25/2016  . Renal mass, right 08/14/2016  . Status cardiac pacemaker 01/29/2017   Placed for second degree heart block on 01/16/17 Medtronic Azure XT DR MRI SureScan dual-chamber pacemaker  . Stroke [facility])    2011 with residual deficit left sided weakness  . Tobacco dependence    Past Surgical History:  Procedure Laterality Date  . EYE SURGERY    . HERNIA REPAIR    . INGUINAL HERNIA REPAIR Right 11/23/2014   Procedure: right inguinal hernia repair with mesh;  Surgeon: Armandina Gemma, MD;  [facility];  Service: General;  Laterality: Right;  . INSERTION OF MESH N/A 11/23/2014    Procedure: INSERTION OF MESH;  Surgeon: Armandina Gemma, MD;  [facility];  Service: General;  Laterality: N/A;  . MASS EXCISION Left 08/29/2017   Procedure: EXCISION OF LEFT NECK MASS;  Surgeon: Coralie Keens, MD;  Location: [place];  Service: General;  Laterality: Left;  . PACEMAKER IMPLANT N/A 01/16/2017   Procedure: Pacemaker Implant;  Surgeon: Genelda Roark Meredith Leeds, MD;  Location: [place] [facility];  Service: Cardiovascular;  Laterality: N/A;  . SHOULDER SURGERY Bilateral 1988, 1998     Current Outpatient Medications  Medication Sig Dispense Refill  . amLODipine (NORVASC) 10 MG tablet Take 1 tablet (10 mg total) by mouth every morning. 90 tablet 1  . aspirin EC 81 MG tablet Take 1 tablet (81 mg total) by mouth daily. 90 tablet 3  . atorvastatin (LIPITOR) 40 MG tablet Take 1 tablet (40 mg total) by mouth daily. 90 tablet 1  . brimonidine (ALPHAGAN P) 0.1 % SOLN Place 1 drop into both eyes daily.     . divalproex (DEPAKOTE) 500 MG DR tablet Take 1 tablet (500 mg total) by mouth 2 (two) times daily. 180 tablet 4  . dorzolamide-timolol (COSOPT) 22.3-6.8 MG/ML ophthalmic solution Place 1 drop into both eyes daily.     . fluticasone (FLONASE) 50 MCG/ACT nasal spray Place 2 sprays into both nostrils daily. 16 g 6  . folic acid (FOLVITE) 1 MG tablet Take 1 tablet (1 mg total) by mouth daily. 90 tablet 1  . hydrALAZINE (APRESOLINE) 50 MG tablet Take 1 tablet (50 mg total) by mouth 3 (three) times daily. 270 tablet 1  . ketoconazole (NIZORAL) 2 % cream Apply 1 application topically daily as needed for irritation.    Marland Kitchen latanoprost (XALATAN) 0.005 % ophthalmic solution Place 1 drop into both eyes at bedtime.    Marland Kitchen lisinopril (PRINIVIL,ZESTRIL) 10 MG  tablet Take 1 tablet (10 mg total) by mouth daily. 90 tablet 1  . metoprolol tartrate (LOPRESSOR) 50 MG tablet Take 1 tablet (50 mg total) by mouth 2 (two) times daily. 180 tablet 2  . Multiple Vitamins-Iron (DAILY VITAMIN FORMULA+IRON) TABS Take 1 tablet  by mouth daily.    . polyethylene glycol (MIRALAX / GLYCOLAX) packet Take 17 g by mouth daily as needed for mild constipation. 30 each 3  . Sennosides-Docusate Sodium (SENNA S PO) Take 1 tablet by mouth daily as needed (constipation).     . terbinafine (LAMISIL AT) 1 % cream Apply 1 application topically 2 (two) times daily. To R foot (Patient taking differently: Apply 1 application topically daily as needed (rash). ) 30 g 0  . thiamine 100 MG tablet Take 1 tablet (100 mg total) by mouth daily. 30 tablet 0  . torsemide (DEMADEX) 20 MG tablet Take 20 mg by mouth daily.    Marland Kitchen acetaminophen (TYLENOL) 325 MG tablet Take 325 mg by mouth every 6 (six) hours as needed for mild pain or headache. Take 2 tablets every 6 hours as needed for fever greater than 101.      No current facility-administered medications for this visit.     Allergies:   Ace inhibitors; Doxycycline; Atacand hct [candesartan cilexetil-hctz]; and Shellfish allergy   Social History:  The patient  reports that he has been smoking cigarettes. He has a 40.00 pack-year smoking history. He has never used smokeless tobacco. He reports that he does not drink alcohol or use drugs.   Family History:  The patient's family history includes COPD in his sister; Cancer in his sister; Diabetes in his sister; Hypertension in his mother and sister.   ROS:  Please see the history of present illness.   Otherwise, review of systems is positive for weight loss, appetite change, chills, visual disturbance, cough, snoring, constipation, anxiety.   All other systems are reviewed and negative.   PHYSICAL EXAM: VS:  BP 136/72   Pulse 60   Ht 6\' 2"  (1.88 m)   Wt 170 lb (77.1 kg)   BMI 21.83 kg/m  , BMI Body mass index is 21.83 kg/m. GEN: Well nourished, well developed, in no acute distress  HEENT: normal  Neck: no JVD, carotid bruits, or masses Cardiac: RRR; no murmurs, rubs, or gallops,no edema  Respiratory:  clear to auscultation bilaterally, normal  work of breathing GI: soft, nontender, nondistended, + BS MS: no deformity or atrophy  Skin: warm and dry, device site well healed Neuro:  Strength and sensation are intact Psych: euthymic mood, full affect  EKG:  EKG is not ordered today. Personal review of the ekg ordered 07/14/18 shows SR, rate 79  Personal review of the device interrogation today. Results in [place]: 06/13/2018: ALT 6; TSH 1.600 07/14/2018: BUN 26; Creatinine, Ser 2.09; Hemoglobin 12.6; Platelets 266; Potassium 4.3; Sodium 141    Lipid Panel     Component Value Date/Time   CHOL 112 06/13/2018 0954   TRIG 51 06/13/2018 0954   HDL 40 06/13/2018 0954   CHOLHDL 2.8 06/13/2018 0954   CHOLHDL 4.1 08/16/2015 1555   VLDL 23 08/16/2015 1555   LDLCALC 62 06/13/2018 0954     Wt Readings from Last 3 Encounters:  07/16/18 170 lb (77.1 kg)  07/14/18 170 lb (77.1 kg)  06/13/18 162 lb 12.8 oz (73.8 kg)      Other studies Reviewed: Additional studies/ records that were reviewed today include: TTE 2014  Review of the above records today demonstrates:  - Left ventricle: The cavity size was normal. There was severe concentric and mild asymmetric hypertrophy. Systolic function was normal. Wall motion was normal; there were no regional wall motion abnormalities. Doppler parameters are consistent with abnormal left ventricular relaxation (grade 1 diastolic dysfunction). - Aortic root: The aortic root was mildly dilated. - Right atrium: The atrium was mildly dilated. - Pulmonary arteries: Systolic pressure was mildly increased. PA peak pressure: 60mm Hg (S).  ASSESSMENT AND PLAN:  1.  2:1 AV block: Status post Medtronic dual-chamber pacemaker.  Device functioning appropriately.  No changes.  2. Hypertension: Mildly elevated but has been normal over past checks.  No changes.  Current medicines are reviewed at length with the patient today.   The patient does not have concerns regarding  his medicines.  The following changes were made today: None  Labs/ tests ordered today include:  No orders of the defined types were placed in this encounter.    Disposition:   FU with Darrell [name] 12 months  Signed, Deryl Ports Meredith Leeds, MD  07/16/2018 8:27 AM     [facility] [address] [place] [address] [phone] (office) [phone] (fax)

## 2018-07-16 NOTE — Telephone Encounter (Signed)
Received call from Jeanette Caprice at [facility] PCP office stating the patient is in PCP's office today for ED follow up due to seizures. PCP requesting patient be seen by Dr Leta Baptist. Scheduled for 07/24/18 and advised Jeanette Caprice let patient know to arrive 30 minutes early to check in. She verbalized understanding, appreciation.

## 2018-07-16 NOTE — Patient Instructions (Signed)
Medication Instructions:  Your physician recommends that you continue on your current medications as directed. Please refer to the Current Medication list given to you today.  If you need a refill on your cardiac medications before your next appointment, please call your pharmacy.   Lab work: None ordered If you have labs (blood work) drawn today and your tests are completely normal, you will receive your results only by: Marland Kitchen MyChart Message (if you have MyChart) OR . A paper copy in the mail If you have any lab test that is abnormal or we need to change your treatment, we will call you to review the results.  Testing/Procedures: None ordered  Follow-Up: Remote monitoring is used to monitor your Pacemaker  from home. This monitoring reduces the number of office visits required to check your device to one time per year. It allows [place] to keep an eye on the functioning of your device to ensure it is working properly. You are scheduled for a device check from home on 09/23/2018. You may send your transmission at any time that day. If you have a wireless device, the transmission will be sent automatically. After your physician reviews your transmission, you will receive a postcard with your next transmission date.  At [facility], you and your health needs are our priority.  As part of our continuing mission to provide you with exceptional heart care, we have created designated Provider Care Teams.  These Care Teams include your primary Cardiologist (physician) and Advanced Practice Providers (APPs -  Physician Assistants and Nurse Practitioners) who all work together to provide you with the care you need, when you need it. You will need a follow up appointment in 1 year.  Please call our office 2 months in advance to schedule this appointment.  You may see Dr. Curt Bears or one of the following Advanced Practice Providers on your designated Care Team:   Chanetta Marshall, NP . Tommye Standard, PA-C  Any Other  Special Instructions Will Be Listed Below (If Applicable).  Thank you for choosing [facility]!!   Trinidad Curet, RN [phone]    \

## 2018-07-16 NOTE — Patient Instructions (Signed)
Seizure, Adult °When you have a seizure: °· Parts of your body may move. °· How aware or awake (conscious) you are may change. °· You may shake (convulse). ° °Some people have symptoms right before a seizure happens. These symptoms may include: °· Fear. °· Worry (anxiety). °· Feeling like you are going to throw up (nausea). °· Feeling like the room is spinning (vertigo). °· Feeling like you saw or heard something before (deja vu). °· Odd tastes or smells. °· Changes in vision, such as seeing flashing lights or spots. ° °Seizures usually last from 30 seconds to 2 minutes. Usually, they are not harmful unless they last a long time. °Follow these instructions at home: °Medicines °· Take over-the-counter and prescription medicines only as told by your doctor. °· Avoid anything that may keep your medicine from working, such as alcohol. °Activity °· Do not do any activities that would be dangerous if you had another seizure, like driving or swimming. Wait until your doctor approves. °· If you live in the [place] ask your local DMV (department of motor vehicles) when you can drive. °· Rest. °Teaching others °· Teach friends and family what to do when you have a seizure. They should: °? Lay you on the ground. °? Protect your head and body. °? Loosen any tight clothing around your neck. °? Turn you on your side. °? Stay with you until you are better. °? Not hold you down. °? Not put anything in your mouth. °? Know whether or not you need emergency care. °General instructions °· Contact your doctor each time you have a seizure. °· Avoid anything that gives you seizures. °· Keep a seizure diary. Write down: °? What you think caused each seizure. °? What you remember about each seizure. °· Keep all follow-up visits as told by your doctor. This is important. °Contact a doctor if: °· You have another seizure. °· You have seizures more often. °· There is any change in what happens during your seizures. °· You continue to have  seizures with treatment. °· You have symptoms of being sick or having an infection. °Get help right away if: °· You have a seizure: °? That lasts longer than 5 minutes. °? That is different than seizures you had before. °? That makes it harder to breathe. °? After you hurt your head. °· After a seizure, you cannot speak or use a part of your body. °· After a seizure, you are confused or have a bad headache. °· You have two or more seizures in a row. °· You are having seizures more often. °· You do not wake up right after a seizure. °· You get hurt during a seizure. °In an emergency: °· These symptoms may be an emergency. Do not wait to see if the symptoms will go away. Get medical help right away. Call your local emergency services (911 in the [place] Do not drive yourself to the hospital. °This information is not intended to replace advice given to you by your health care provider. Make sure you discuss any questions you have with your health care provider. °Document Released: 02/28/2008 Document Revised: 05/24/2016 Document Reviewed: 05/24/2016 °Elsevier Interactive Patient Education © 2017 [employer] Inc[employer] ° °

## 2018-07-16 NOTE — Progress Notes (Signed)
Patient ID: Darrell [name], male    DOB: 1950-10-12, 67 y.o.   MRN: [id number]  PCP: [name], MD  Chief Complaint  Patient presents with  . Follow-up    ed on 10/20 for seizures.    Subjective:  HPI Darrell [name] is a 67 y.o. male presents for ED follow-up s/p seizure activity.   [name] has HTN (hypertension); Tobacco abuse; Inguinal hernia; History of CVA with residual deficit; Prediabetes; CKD (chronic kidney disease) stage 3, GFR 30-59 ml/min ([place]); At high risk for falls; Subcutaneous nodules; Elevated liver enzymes; Liver hemangioma; Renal cyst; Renal mass, right; Glaucoma; Hypercalcemia; Heart block; Status cardiac pacemaker; Unintended weight loss; Tinea pedis of right foot; New onset seizure ([place]); CIDP (chronic inflammatory demyelinating polyneuropathy) ([place]); Bradycardia; Hypertensive retinopathy of both eyes; Nuclear sclerosis of both eyes; Primary open angle glaucoma of both eyes, indeterminate stage; GERD (gastroesophageal reflux disease); Hyperlipidemia; Dysarthria; Dysphagia; Gait abnormality; and Spasticity on their problem list.   ER Follow-up: History provided mostly by spouse.Spouse reports that patient was sitting the side of the bed and began shaking uncontrollably. Once he stopped moving, he was non-responsive and transported to ER via EMS. At ER labs were reviewed which indicated a elevated creatinine and subtherapeutic doses of Depakote. He was given 1,000 mg dose of Depakote and discharge to follow-up with PCP and neurology outpatient. Spouse reports no more seizure activity. She feel patient speech is slurred and his movements are slowed compared to his baseline. He is followed by Dr. Leta Baptist at [facility] and has most recently received management by Dr. Tillman Abide at [facility] Neurology department. He is scheduled to start OT and PT, however, he needs a medical clearance letter. He was seen by cardiology today, for evaluation of AV heart block  and evaluation of functioning of dual-chamber pacemake, which notes patient condition as unchanged.   Since ER visit, no additional seizure activity, headache or  chest pain. Social History   Socioeconomic History  . Marital status: Married    Spouse name: [name]  . Number of children: 1  . Years of education: college  . Highest education level: Not on file  Occupational History  . Occupation: retired  Scientific laboratory technician  . Financial resource strain: Not on file  . Food insecurity:    Worry: Not on file    Inability: Not on file  . Transportation needs:    Medical: Not on file    Non-medical: Not on file  Tobacco Use  . Smoking status: Current Every Day Smoker    Packs/day: 1.00    Years: 40.00    Pack years: 40.00    Types: Cigarettes  . Smokeless tobacco: Never Used  . Tobacco comment: 09/13/17 little < 1 PPD  Substance and Sexual Activity  . Alcohol use: No    Comment: alcohol free for 1 year was drinking 1/2 pint per day   . Drug use: No  . Sexual activity: Not on file  Lifestyle  . Physical activity:    Days per week: Not on file    Minutes per session: Not on file  . Stress: Not on file  Relationships  . Social connections:    Talks on phone: Not on file    Gets together: Not on file    Attends religious service: Not on file    Active member of club or organization: Not on file    Attends meetings of clubs or organizations: Not on file    Relationship  status: Not on file  . Intimate partner violence:    Fear of current or ex partner: Not on file    Emotionally abused: Not on file    Physically abused: Not on file    Forced sexual activity: Not on file  Other Topics Concern  . Not on file  Social History Narrative   09/13/17 Patient lives at home with spouse.   Has 36 yo daughter   No grandchildren    Family History  Problem Relation Age of Onset  . Hypertension Mother   . Diabetes Sister   . Hypertension Sister   . Cancer Sister        1 sister  .  COPD Sister        in 1 sister   Review of Systems  Pertinent negatives listed in HPI Patient Active Problem List   Diagnosis Date Noted  . Dysarthria 05/21/2018  . Dysphagia 05/21/2018  . Gait abnormality 05/21/2018  . Spasticity 05/21/2018  . GERD (gastroesophageal reflux disease) 07/21/2017  . Hyperlipidemia 07/21/2017  . CIDP (chronic inflammatory demyelinating polyneuropathy) ([place])   . Bradycardia   . New onset seizure ([place]) 07/08/2017  . Status cardiac pacemaker 01/29/2017  . Unintended weight loss 01/29/2017  . Tinea pedis of right foot 01/29/2017  . Hypertensive retinopathy of both eyes 01/16/2017  . Hypercalcemia 01/15/2017  . Heart block 01/15/2017  . Nuclear sclerosis of both eyes 10/25/2016  . Primary open angle glaucoma of both eyes, indeterminate stage 10/25/2016  . Glaucoma 09/12/2016  . Liver hemangioma 08/14/2016  . Renal cyst 08/14/2016  . Renal mass, right 08/14/2016  . Elevated liver enzymes 08/10/2016  . CKD (chronic kidney disease) stage 3, GFR 30-59 ml/min ([name]) 08/16/2015  . At high risk for falls 08/16/2015  . Subcutaneous nodules 08/16/2015  . Prediabetes 10/07/2013  . Inguinal hernia 03/25/2013  . History of CVA with residual deficit 03/25/2013  . HTN (hypertension) 10/09/2012  . Tobacco abuse 10/09/2012    Allergies  Allergen Reactions  . Ace Inhibitors Other (See Comments)    Hyperkalemia  . Doxycycline Other (See Comments)    Hiccups, cough, nausea and emesis, elevated liver enzymes, elevated eosinophils, SOB concerning for early DRESS syndrome   . Atacand Hct [Candesartan Cilexetil-Hctz] Hives  . Shellfish Allergy Hives    Prior to Admission medications   Medication Sig Start Date End Date Taking? Authorizing Provider  acetaminophen (TYLENOL) 325 MG tablet Take 325 mg by mouth every 6 (six) hours as needed for mild pain or headache. Take 2 tablets every 6 hours as needed for fever greater than 101.    Yes [provider]   amLODipine (NORVASC) 10 MG tablet Take 1 tablet (10 mg total) by mouth every morning. 06/13/18  Yes [name], MD  aspirin EC 81 MG tablet Take 1 tablet (81 mg total) by mouth daily. 02/20/17  Yes Funches, Josalyn, MD  atorvastatin (LIPITOR) 40 MG tablet Take 1 tablet (40 mg total) by mouth daily. 06/13/18  Yes Newlin, Enobong, MD  brimonidine (ALPHAGAN P) 0.1 % SOLN Place 1 drop into both eyes daily.    Yes [provider]  divalproex (DEPAKOTE) 500 MG DR tablet Take 1 tablet (500 mg total) by mouth 2 (two) times daily. 03/19/18  Yes Penumalli, Earlean Polka, MD  dorzolamide-timolol (COSOPT) 22.3-6.8 MG/ML ophthalmic solution Place 1 drop into both eyes daily.    Yes [provider]  fluticasone (FLONASE) 50 MCG/ACT nasal spray Place 2 sprays into both nostrils daily.  09/12/16  Yes Funches, Adriana Mccallum, MD  folic acid (FOLVITE) 1 MG tablet Take 1 tablet (1 mg total) by mouth daily. 08/15/17  Yes Freeman Caldron M, PA-C  hydrALAZINE (APRESOLINE) 50 MG tablet Take 1 tablet (50 mg total) by mouth 3 (three) times daily. 06/13/18  Yes [name], MD  ketoconazole (NIZORAL) 2 % cream Apply 1 application topically daily as needed for irritation.   Yes [provider]  latanoprost (XALATAN) 0.005 % ophthalmic solution Place 1 drop into both eyes at bedtime.   Yes [provider]  lisinopril (PRINIVIL,ZESTRIL) 10 MG tablet Take 1 tablet (10 mg total) by mouth daily. 06/13/18  Yes [name], MD  metoprolol tartrate (LOPRESSOR) 50 MG tablet Take 1 tablet (50 mg total) by mouth 2 (two) times daily. 05/08/18  Yes Camnitz, Will Hassell Done, MD  Multiple Vitamins-Iron (DAILY VITAMIN FORMULA+IRON) TABS Take 1 tablet by mouth daily.   Yes [provider]  polyethylene glycol (MIRALAX / GLYCOLAX) packet Take 17 g by mouth daily as needed for mild constipation. 03/07/18  Yes [name], MD  Sennosides-Docusate Sodium (SENNA S PO) Take 1 tablet by mouth daily as needed  (constipation).    Yes [provider]  terbinafine (LAMISIL AT) 1 % cream Apply 1 application topically 2 (two) times daily. To R foot Patient taking differently: Apply 1 application topically daily as needed (rash).  01/29/17  Yes Funches, Josalyn, MD  thiamine 100 MG tablet Take 1 tablet (100 mg total) by mouth daily. 07/12/17  Yes Mikhail, Velta Addison, DO  torsemide (DEMADEX) 20 MG tablet Take 20 mg by mouth daily.   Yes [provider]    Past Medical, Surgical Family and Social History reviewed and updated.    Objective:   Today's Vitals   07/16/18 1344  BP: 120/76  Pulse: 60  Resp: 17  Temp: 98 F (36.7 C)  TempSrc: Oral  SpO2: 97%  Weight: 171 lb (77.6 kg)  Height: 6\' 2"  (1.88 m)    Wt Readings from Last 3 Encounters:  07/16/18 171 lb (77.6 kg)  07/16/18 170 lb (77.1 kg)  07/14/18 170 lb (77.1 kg)     Physical Exam General appearance: alert, well developed, well nourished, cooperative and in no distress Head: Normocephalic, without obvious abnormality, atraumatic Respiratory: Respirations even and unlabored, normal respiratory rate Heart: rate and rhythm normal. No gallop or murmurs noted on exam  Skin: Skin color, texture, turgor normal. No rashes seen  Psych: Appropriate mood and affect. Neurologic: Mental status: Alert, oriented to person, place, needs assistance with orientation with time. Thought content appropriate. Gait abnormality, uses mobility aid  No results found for: POCGLU  Lab Results  Component Value Date   HGBA1C 5.3 10/07/2013         Assessment & Plan:  1. Seizure disorder [facility]) -Patient is scheduled to follow-up with   2. Second degree AV block, stable Dual chamber pacemaker implanted. Continue follow-up with cardiology  3. CIDP (chronic inflammatory demyelinating polyneuropathy) ([name]) -Continue follow-up with neurology team -currently scheduled for PT and OT  4. Spasticity, stable  -Currently scheduled for PT and  OT  5. Immobility -Spouse is in need of assistance at home given the deteriorating nature of patient physical condition.   -Will research and see what services are available for patient and follow-up. -Currently scheduled for PT and OT     -The patient was given clear instructions to go to ER or return to medical center if symptoms do not improve, worsen  or new problems develop. The patient verbalized understanding.     Molli Barrows, FNP Primary Care at [facility] [address] [place] [phone]: [phone]

## 2018-07-17 LAB — CUP PACEART INCLINIC DEVICE CHECK
Battery Voltage: 3.01 V
Brady Statistic AP VP Percent: 26.6 %
Brady Statistic AP VS Percent: 1.02 %
Brady Statistic AS VS Percent: 2.18 %
Brady Statistic RV Percent Paced: 96.8 %
Implantable Lead Implant Date: 20180424
Implantable Lead Location: 753859
Implantable Lead Model: 5076
Implantable Pulse Generator Implant Date: 20180424
Lead Channel Impedance Value: 361 Ohm
Lead Channel Impedance Value: 399 Ohm
Lead Channel Impedance Value: 418 Ohm
Lead Channel Pacing Threshold Amplitude: 0.625 V
Lead Channel Pacing Threshold Pulse Width: 0.4 ms
Lead Channel Sensing Intrinsic Amplitude: 1.625 mV
Lead Channel Sensing Intrinsic Amplitude: 18.375 mV
Lead Channel Setting Pacing Amplitude: 1.5 V
Lead Channel Setting Pacing Amplitude: 2.5 V
Lead Channel Setting Sensing Sensitivity: 2 mV
MDC IDC LEAD IMPLANT DT: 20180424
MDC IDC LEAD LOCATION: 753860
MDC IDC MSMT BATTERY REMAINING LONGEVITY: 118 mo
MDC IDC MSMT LEADCHNL RA IMPEDANCE VALUE: 323 Ohm
MDC IDC MSMT LEADCHNL RA PACING THRESHOLD AMPLITUDE: 0.625 V
MDC IDC MSMT LEADCHNL RA PACING THRESHOLD PULSEWIDTH: 0.4 ms
MDC IDC MSMT LEADCHNL RA SENSING INTR AMPL: 1.75 mV
MDC IDC MSMT LEADCHNL RV SENSING INTR AMPL: 15.875 mV
MDC IDC SESS DTM: 20191022121419
MDC IDC SET LEADCHNL RV PACING PULSEWIDTH: 0.4 ms
MDC IDC STAT BRADY AS VP PERCENT: 70.2 %
MDC IDC STAT BRADY RA PERCENT PACED: 29.04 %

## 2018-07-18 ENCOUNTER — Telehealth: Payer: Self-pay | Admitting: Family Medicine

## 2018-07-18 DIAGNOSIS — N183 Chronic kidney disease, stage 3 (moderate): Secondary | ICD-10-CM | POA: Diagnosis not present

## 2018-07-18 DIAGNOSIS — I129 Hypertensive chronic kidney disease with stage 1 through stage 4 chronic kidney disease, or unspecified chronic kidney disease: Secondary | ICD-10-CM | POA: Diagnosis not present

## 2018-07-18 NOTE — Telephone Encounter (Signed)
Flyer was mailed after patient's appt on 07/16/18.

## 2018-07-18 NOTE — Telephone Encounter (Signed)
Please contact patient's wife and provide her with the phone number for [facility] to access services that may be available for patient in the home setting.  Also, please mail her a [facility] flyer.  Molli Barrows, FNP Primary Care at [facility] [address] [place] [phone]: [phone]

## 2018-07-22 DIAGNOSIS — H2513 Age-related nuclear cataract, bilateral: Secondary | ICD-10-CM | POA: Diagnosis not present

## 2018-07-22 DIAGNOSIS — H401134 Primary open-angle glaucoma, bilateral, indeterminate stage: Secondary | ICD-10-CM | POA: Diagnosis not present

## 2018-07-24 ENCOUNTER — Encounter: Payer: Self-pay | Admitting: Diagnostic Neuroimaging

## 2018-07-24 ENCOUNTER — Ambulatory Visit: Payer: Medicare HMO | Admitting: Diagnostic Neuroimaging

## 2018-07-24 VITALS — BP 130/84 | HR 76 | Ht 74.0 in | Wt 170.0 lb

## 2018-07-24 DIAGNOSIS — G1221 Amyotrophic lateral sclerosis: Secondary | ICD-10-CM | POA: Insufficient documentation

## 2018-07-24 DIAGNOSIS — G1220 Motor neuron disease, unspecified: Secondary | ICD-10-CM

## 2018-07-24 DIAGNOSIS — R569 Unspecified convulsions: Secondary | ICD-10-CM

## 2018-07-24 MED ORDER — DIVALPROEX SODIUM 500 MG PO DR TAB: 1000.0000 mg | DELAYED_RELEASE_TABLET | Freq: Two times a day (BID) | ORAL | 4 refills | Status: DC

## 2018-07-24 NOTE — Progress Notes (Signed)
[facility]  PATIENT: Darrell [name] DOB: 09/25/1951  REFERRING CLINICIAN: Vanita Panda HISTORY FROM: patient and wife REASON FOR VISIT: follow up   HISTORICAL  CHIEF COMPLAINT:  Chief Complaint  Patient presents with  . Seizures    rm 7, wife- [name], "in ED on 07/14/18 for seizure; no illness or missed doses of seizure medication."  . Follow-up    ED FU    HISTORY OF PRESENT ILLNESS:   UPDATE (07/24/18, VRP): Since last visit, has had 2nd opinion at [facility], and now likely has ALS or PLS diagnosis. Symptoms are progressive. Severity is severe. No alleviating or aggravating factors. Has follow up in [facility].  Also had 2 more seizures on 07/14/18; VPA level at ER was undetectable. Given IV loading dose and sent home.   UPDATE (03/19/18, VRP): Since last visit, doing well. No seizures. No alleviating or aggravating factors. More muscle stiffness.   NEW HPI (09/13/17, VRP): 67 year old male with CIDP, here for seizure evaluation. Since last visit, patient declined IVIG therapy that I had ordered in 2015, and then patient lost to followup. Gait and balance continue to decline. 20lb weight loss over last year. Now with new onset seizure in Oct 2018 at home (witnessed by wife). Admitted to hospital and started on levetiracetam, then changed to divalproex 500mg  twice a day, and doing well. No further seizures. No alleviating or aggravating factors.   UPDATE 03/30/14: Since last visit, patient feels strength is stable. Still with slurred speech, swallow diff, balance diff. Now he is interested in IVIG treatment. Completed PT sessions.  UPDATE 11/25/13: Since last visit patient had EMG and spinal tap. Findings are suspicious for CIDP. Patient's symptoms have gradually progressed. Continues to have more upper and lower extremity weakness, dysarthria, dysphasia and intermittent muscle twitching. Patient is frustrated today and does not want to take anymore medication. Patient's  wife would like to find out more options for treatment.  UPDATE 10/03/13: Since last visit, continues to have left sided weakness, worsening speech and swallow difficulty. Is drinking half pint Etoh per 1-2 weeks. Cigarette use has increased to 2 packs per day.  PRIOR HPI (04/03/13): 67 year old right-handed male with hypertension, hypercholesterolemia, smoking, alcohol abuse, here for evaluation of gait and balance difficulty for past one year.  2011 patient had left arm and left leg weakness, diagnosed with stroke. Apparently he was admitted to hospital and treated. He had fairly good recovery initial one year after stroke. He required using a walking stick but otherwise was fairly mobile. Unfortunately he did not have good medical followup at that time. Patient was abusing alcohol significantly at that time, ranging from 1 pint up to a fifth of alcohol per day. In the past one to 2 years he has cut down on his drinking, now averaging 1 pint of alcohol per week.  In the past one year, patient's mobility is significantly deteriorated. He also has increasing slurred speech, trouble swallowing, balance and gait difficulty. He continues to fall down. Symptoms are worse when he drinks alcohol. Nowadays he is unable to stand up and walk across a room without assistance. This is been the case for the past one year.  REVIEW OF SYSTEMS: Full 14 system review of systems performed and negative except: as per HPI.    ALLERGIES: Allergies  Allergen Reactions  . Ace Inhibitors Other (See Comments)    Hyperkalemia  . Doxycycline Other (See Comments)    Hiccups, cough, nausea and emesis, elevated  liver enzymes, elevated eosinophils, SOB concerning for early DRESS syndrome   . Atacand Hct [Candesartan Cilexetil-Hctz] Hives  . Shellfish Allergy Hives    HOME MEDICATIONS: Outpatient Medications Prior to Visit  Medication Sig Dispense Refill  . acetaminophen (TYLENOL) 325 MG tablet Take 325 mg by mouth every 6  (six) hours as needed for mild pain or headache. Take 2 tablets every 6 hours as needed for fever greater than 101.     Marland Kitchen amLODipine (NORVASC) 10 MG tablet Take 1 tablet (10 mg total) by mouth every morning. 90 tablet 1  . aspirin EC 81 MG tablet Take 1 tablet (81 mg total) by mouth daily. 90 tablet 3  . atorvastatin (LIPITOR) 40 MG tablet Take 1 tablet (40 mg total) by mouth daily. 90 tablet 1  . brimonidine (ALPHAGAN P) 0.1 % SOLN Place 1 drop into both eyes daily.     . divalproex (DEPAKOTE) 500 MG DR tablet Take 1 tablet (500 mg total) by mouth 2 (two) times daily. 180 tablet 4  . dorzolamide-timolol (COSOPT) 22.3-6.8 MG/ML ophthalmic solution Place 1 drop into both eyes daily.     . hydrALAZINE (APRESOLINE) 50 MG tablet Take 1 tablet (50 mg total) by mouth 3 (three) times daily. 270 tablet 1  . latanoprost (XALATAN) 0.005 % ophthalmic solution Place 1 drop into both eyes at bedtime.    Marland Kitchen lisinopril (PRINIVIL,ZESTRIL) 10 MG tablet Take 1 tablet (10 mg total) by mouth daily. 90 tablet 1  . metoprolol tartrate (LOPRESSOR) 50 MG tablet Take 1 tablet (50 mg total) by mouth 2 (two) times daily. 180 tablet 2  . Multiple Vitamins-Iron (DAILY VITAMIN FORMULA+IRON) TABS Take 1 tablet by mouth daily.    . polyethylene glycol (MIRALAX / GLYCOLAX) packet Take 17 g by mouth daily as needed for mild constipation. 30 each 3  . Sennosides-Docusate Sodium (SENNA S PO) Take 1 tablet by mouth daily as needed (constipation).     . torsemide (DEMADEX) 20 MG tablet Take 20 mg by mouth daily.    . fluticasone (FLONASE) 50 MCG/ACT nasal spray Place 2 sprays into both nostrils daily. (Patient not taking: Reported on 96/28/3662) 16 g 6  . folic acid (FOLVITE) 1 MG tablet Take 1 tablet (1 mg total) by mouth daily. (Patient not taking: Reported on 07/24/2018) 90 tablet 1  . ketoconazole (NIZORAL) 2 % cream Apply 1 application topically daily as needed for irritation.    . terbinafine (LAMISIL AT) 1 % cream Apply 1  application topically 2 (two) times daily. To R foot (Patient not taking: Reported on 07/24/2018) 30 g 0  . thiamine 100 MG tablet Take 1 tablet (100 mg total) by mouth daily. (Patient not taking: Reported on 07/24/2018) 30 tablet 0   No facility-administered medications prior to visit.     PAST MEDICAL HISTORY: Past Medical History:  Diagnosis Date  . Arthritis   . At high risk for falls 08/16/2015  . Bradycardia   . Chronic kidney disease    stage 3 GFR 30-59 ml/min   . CIDP (chronic inflammatory demyelinating polyneuropathy) ([place])   . CKD (chronic kidney disease) stage 3, GFR 30-59 ml/min ([name]) 08/16/2015  . Dysphagia as late effect of cerebrovascular disease    pts wife states pt has to eat soft foods   . Elevated liver enzymes 08/10/2016  . GERD (gastroesophageal reflux disease)   . Glaucoma   . High cholesterol   . History of CVA with residual deficit 03/25/2013  . Hypertension   .  Hypertensive retinopathy of both eyes 01/16/2017  . Inguinal hernia 03/25/2013  . Liver hemangioma 08/14/2016  . Neuromuscular disorder ([place])    chronic inflammatory demyelinating polyneuropathy   . New onset seizure ([place]) 07/08/2017   seizure 07/14/18  . Nuclear sclerosis of both eyes 10/25/2016  . Presence of permanent cardiac pacemaker    placed in april 2018  . Primary open angle glaucoma of both eyes, indeterminate stage 10/25/2016  . Renal mass, right 08/14/2016  . Status cardiac pacemaker 01/29/2017   Placed for second degree heart block on 01/16/17 Medtronic Azure XT DR MRI SureScan dual-chamber pacemaker  . Stroke [facility])    2011 with residual deficit left sided weakness  . Tobacco dependence     PAST SURGICAL HISTORY: Past Surgical History:  Procedure Laterality Date  . EYE SURGERY    . HERNIA REPAIR    . INGUINAL HERNIA REPAIR Right 11/23/2014   Procedure: right inguinal hernia repair with mesh;  Surgeon: Armandina Gemma, MD;  Location: [facility];  Service: General;  Laterality: Right;  .  INSERTION OF MESH N/A 11/23/2014   Procedure: INSERTION OF MESH;  Surgeon: Armandina Gemma, MD;  Location: [facility];  Service: General;  Laterality: N/A;  . MASS EXCISION Left 08/29/2017   Procedure: EXCISION OF LEFT NECK MASS;  Surgeon: Coralie Keens, MD;  Location: [facility];  Service: General;  Laterality: Left;  . PACEMAKER IMPLANT N/A 01/16/2017   Procedure: Pacemaker Implant;  Surgeon: Will Meredith Leeds, MD;  Location: [facility];  Service: Cardiovascular;  Laterality: N/A;  . SHOULDER SURGERY Bilateral 1988, 1998    FAMILY HISTORY: Family History  Problem Relation Age of Onset  . Hypertension Mother   . Diabetes Sister   . Hypertension Sister   . Cancer Sister        1 sister  . COPD Sister        in 1 sister    SOCIAL HISTORY:  Social History   Socioeconomic History  . Marital status: Married    Spouse name: [name]  . Number of children: 1  . Years of education: college  . Highest education level: Not on file  Occupational History  . Occupation: retired  Scientific laboratory technician  . Financial resource strain: Not on file  . Food insecurity:    Worry: Not on file    Inability: Not on file  . Transportation needs:    Medical: Not on file    Non-medical: Not on file  Tobacco Use  . Smoking status: Current Every Day Smoker    Packs/day: 1.00    Years: 40.00    Pack years: 40.00    Types: Cigarettes  . Smokeless tobacco: Never Used  . Tobacco comment: 09/13/17 little < 1 PPD  Substance and Sexual Activity  . Alcohol use: No    Comment: alcohol free for 1 year was drinking 1/2 pint per day   . Drug use: No  . Sexual activity: Not on file  Lifestyle  . Physical activity:    Days per week: Not on file    Minutes per session: Not on file  . Stress: Not on file  Relationships  . Social connections:    Talks on phone: Not on file    Gets together: Not on file    Attends religious service: Not on file    Active member of club or organization: Not on file    Attends  meetings of clubs or organizations: Not on file  Relationship status: Not on file  . Intimate partner violence:    Fear of current or ex partner: Not on file    Emotionally abused: Not on file    Physically abused: Not on file    Forced sexual activity: Not on file  Other Topics Concern  . Not on file  Social History Narrative   09/13/17 Patient lives at home with spouse.   Has 49 yo daughter   No grandchildren     PHYSICAL EXAM  Vitals:   07/24/18 1253  BP: 130/84  Pulse: 76  Weight: 170 lb (77.1 kg)  Height: 6\' 2"  (1.88 m)    Not recorded      Body mass index is 21.83 kg/m.  GENERAL EXAM: Patient is in no distress  CARDIOVASCULAR: Regular rate and rhythm, no murmurs, no carotid bruits  NEUROLOGIC: MENTAL STATUS: awake, alert, language fluent, comprehension intact, naming intact CRANIAL NERVE: pupils equal and reactive to light, visual fields full to confrontation, extraocular muscles intact, no nystagmus, facial sensation and strength symmetric, uvula midline, shoulder shrug symmetric, tongue midline. SEVERE DYSARTHIA. INTERMITTENT TONGUE FASCICULATIONS.  MOTOR: INCREASED TONE IN LUE > RUE; LLE > RLE. RUE 5, RLE 5. LUE 3 PROX, 3-4 DISTAL. RLE 5; LLE 4  SENSORY: DECR IN LUE AND LLE TO TEMP AND VIBRATION COORDINATION: LIMITED IN LEFT SIDE. REFLEXES: BUE and BLE 3+, BUT SLIGHTLY MORE ON LEFT SIDE THAN RIGHT. CLONUS AT ANKLES (L > R) GAIT/STATION: LEANS BACK IN WHEELCHAIR; CANNOT STAND INDEPENDENTLY    DIAGNOSTIC DATA (LABS, IMAGING, TESTING) - I reviewed patient records, labs, notes, testing and imaging myself where available.  Lab Results  Component Value Date   WBC 14.9 (H) 07/14/2018   HGB 12.6 (L) 07/14/2018   HCT 39.8 07/14/2018   MCV 92.3 07/14/2018   PLT 266 07/14/2018      Component Value Date/Time   NA 141 07/14/2018 2308   NA 143 06/13/2018 0954   K 4.3 07/14/2018 2308   CL 105 07/14/2018 2308   CO2 27 07/14/2018 2308   GLUCOSE 125 (H)  07/14/2018 2308   BUN 26 (H) 07/14/2018 2308   BUN 19 06/13/2018 0954   CREATININE 2.09 (H) 07/14/2018 2308   CREATININE 1.45 (H) 08/10/2016 1647   CALCIUM 10.3 07/14/2018 2308   PROT 6.6 06/13/2018 0954   ALBUMIN 4.1 06/13/2018 0954   AST 8 06/13/2018 0954   ALT 6 06/13/2018 0954   ALKPHOS 92 06/13/2018 0954   BILITOT 0.4 06/13/2018 0954   GFRNONAA 31 (L) 07/14/2018 2308   GFRNONAA 51 (L) 08/10/2016 1647   GFRAA 36 (L) 07/14/2018 2308   GFRAA 58 (L) 08/10/2016 1647   Lab Results  Component Value Date   CHOL 112 06/13/2018   HDL 40 06/13/2018   LDLCALC 62 06/13/2018   TRIG 51 06/13/2018   CHOLHDL 2.8 06/13/2018   Lab Results  Component Value Date   HGBA1C 5.3 10/07/2013   Lab Results  Component Value Date   ZOXWRUEA54 098 08/16/2015   Lab Results  Component Value Date   TSH 1.600 06/13/2018    02/27/13 CT head - severe chronic small vessel ischemic disease, periventricular, subcortical, pontine and cerebellar.   04/22/13 MRI BRAIN 1. Scattered chronic lacunar ischemic infarctions in the bilateral thalami, right basal ganglia, right centrum semioval and left anterior parasagittal regions.  2. Moderate chronic small vessel ischemic disease. Few small, chronic cerebral microbleeds in the right parietal and and left frontal regions, also within spectrum of chronic small  vessel ischemic disease. 3. No acute findings.  04/22/13 MRI CERVICAL SPINE 1. At C5-6: disc bulging and facet hypertrophy with mild spinal stenosis (AP diameter 29mm) and severe biforaminal foraminal stenosis; no cord signal changes. 2. Multilevel degenerative changes and foraminal stenosis from C2-3 to C6-7 as above.   10/20/13 EMG/NCS 1. 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 Abnormal F-wave latencies (including  absent F-waves and slowing in the demyelinating range in some  nerves) raises possibility of concomitant polyradiculopathy.  Considerations include  chronic immune/inflammatory neuropathies,  such as CIDP. 2. No evidence of myopathy at this time.  10/31/13 CSF results: WBC 5-->0, RBC 25-->2, protein 63, glucose 62, ACE 5, cytology negati ve, culture negative  07/08/17 EEG - There is a posterior dominant rhythm of 7-8 Hz reactive to eye opening and closure.  No focal slowing is present.  No epileptiform discharges or seizures. - There is evidence of mild generalized slowing of brain activity consistent with probable post-ictal state +/- toxic, metabolic, or infectious encephalopathy.  The patient is not in non-convulsive status epilepticus.   07/09/17 MRI brain  - Truncated and motion degraded examination. Within that limitation, no acute ischemia or significant mass effect. Advanced chronic microvascular disease.    ASSESSMENT AND PLAN  67 y.o. year old male  has a past medical history of Arthritis, At high risk for falls (08/16/2015), Bradycardia, Chronic kidney disease, CIDP (chronic inflammatory demyelinating polyneuropathy) ([place]), CKD (chronic kidney disease) stage 3, GFR 30-59 ml/min ([name]) (08/16/2015), Dysphagia as late effect of cerebrovascular disease, Elevated liver enzymes (08/10/2016), GERD (gastroesophageal reflux disease), Glaucoma, High cholesterol, History of CVA with residual deficit (03/25/2013), Hypertension, Hypertensive retinopathy of both eyes (01/16/2017), Inguinal hernia (03/25/2013), Liver hemangioma (08/14/2016), Neuromuscular disorder ([place]), New onset seizure ([place]) (07/08/2017), Nuclear sclerosis of both eyes (10/25/2016), Presence of permanent cardiac pacemaker, Primary open angle glaucoma of both eyes, indeterminate stage (10/25/2016), Renal mass, right (08/14/2016), Status cardiac pacemaker (01/29/2017), Stroke ([place]), and Tobacco dependence. here with progressive gait and balance or deterioration since 2013. Also with dysarthria, dysphagia, left sided weakness and hyperreflexia.   Now with new onset seizure in Oct 2018.  Breakthrough sz in Oct 2019.    Dx1 gait and balance diff: motor neuron disease (ALS vs PLS) + etoh neuropathy  Dx2: seizure disorder  1. Seizures ([place])   2. 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[place])     PLAN:  MUSCLE WEAKNESS / NEUROPATHY / HYPERTONIA / DYSPHAGIA (ALS vs PLS; + sensory neuropathy from ETOH?) - follow up with ALS clinic (Dr. Tillman Abide)  [place] (? related to CNS vascular disease) - increase divalproex to 500mg  in AM and 1000mg  in PM x 2 weeks, then increase to 1000mg  twice a day  - check annual CBC, CMP  CEREBROVACULAR DISEASE - Risk factor mgmt per PCP for cerebrovascular disease - continue smoking and ETOH cessation  CERVICAL SPINAL STENOSIS - Conservative mgmt of cervical spinal stenosis (mild at C5-6)  Meds ordered this encounter  Medications  . divalproex (DEPAKOTE) 500 MG DR tablet    Sig: Take 2 tablets (1,000 mg total) by mouth 2 (two) times daily.    Dispense:  360 tablet    Refill:  4   Return in about 6 months (around 01/23/2019).    Penni Bombard, MD 25/85/2778, 2:42 PM Certified in Neurology, Neurophysiology and Neuroimaging  [facility] [address] [place], [place] [address] [phone]

## 2018-07-24 NOTE — Patient Instructions (Addendum)
MUSCLE WEAKNESS / SPASTICITY (ALS vs PLS) - follow up with ALS clinic  SEIZURE DISORDER (? related to CNS vascular disease) - increase divalproex to 500mg  in AM and 1000mg  in PM x 2 weeks, then increase to 1000mg  twice a day

## 2018-07-25 ENCOUNTER — Telehealth: Payer: Self-pay | Admitting: Diagnostic Neuroimaging

## 2018-07-25 MED FILL — TORSEMIDE 20 MG TABLET: 20 | 30 days supply | Qty: 30 | Fill #1

## 2018-07-25 NOTE — Telephone Encounter (Addendum)
[employer], spoke with Otilio Carpen who stated the error message she is getting from his insurance states:  He has met his plan limit to prices of medication. She advised the patient call his insurance for details.

## 2018-07-25 NOTE — Telephone Encounter (Signed)
Pt wife(on [place],[name]) has called stating there is a balance dispute with the mail order pharmacy.  Wife is asking since pt only has 2 days worth of divalproex (DEPAKOTE) 500 MG DR tablet can a prescription be sent to [place] pt pharmacy for pt.  Please call

## 2018-07-26 MED FILL — DIVALPROEX SOD DR 500 MG TA: 500 | 14 days supply | Qty: 42 | Fill #0

## 2018-07-26 NOTE — Telephone Encounter (Signed)
Spoke with wife, [name] and reviewed the patient's new prescription dosing of divalproex with her. She verbalized back to this RN correctly. Advised her the directions will be on bottle from [place] as well. She stated she was out and is going to pharmacy now. She verbalized understanding of calling [employer] as well, appreciation of call and efforts to get patient his medication.

## 2018-07-26 NOTE — Telephone Encounter (Signed)
Pts wife returning RNs call, aware of what had been stated. Pt appreciated the call

## 2018-07-26 NOTE — Telephone Encounter (Addendum)
Spoke with [place] to inquire why the patient's medication is not being filled. She asked for Rx information and then stated they have Rx ready, however the patient must call to verify information before it is shipped. Will take several days in mail to patient. She advised if he is low on medication a local pharmacy can be called with bridge of medicine until he receives it mail order.  Called wife, on [name] and left detailed VM advising she call [employer] prescription line (number on back of insurance card). Advised her this RN will send in Rx to [place] [facility] until he receives Rx from [place]. Advised our office closes at noon, left number. [facility] pharmacy, spoke with Jinny Blossom and per Dr Leta Baptist, gave her two week Rx for Divalproex 500 mg DR. She will disp #42 , 0 refills and give directions: take 500 mg in morning, 1000 mg at night x 2 weeks.  Called wife, LVM advising her this RN spoke with Jinny Blossom at [facility], and requested she call back to review patient's dosing of drug for next two weeks.

## 2018-08-06 ENCOUNTER — Telehealth: Payer: Self-pay | Admitting: *Deleted

## 2018-08-06 DIAGNOSIS — Z79899 Other long term (current) drug therapy: Secondary | ICD-10-CM

## 2018-08-06 DIAGNOSIS — R569 Unspecified convulsions: Secondary | ICD-10-CM

## 2018-08-06 MED FILL — METOPROLOL TARTRATE 50 MG T: 50 | 30 days supply | Qty: 60 | Fill #0

## 2018-08-06 NOTE — Telephone Encounter (Signed)
Called wife and advised her Dr Leta Baptist needs patient to come in for repeat Depakote level one week after increasing Depakote to 1000 mg twice a day.  She stated he has 5 days left at 500 mg in morning, 1000 mg at night before he increases to 1000 mg twice a day. This RN advised will call her again on 08/15/18 to remind of lab needed either on 11/22 or 11/25. Advised her no appointment needed for repeat lab, and gave her lab hours.  She verbalized understanding, appreciation of call.

## 2018-08-14 NOTE — Telephone Encounter (Signed)
Spoke with wife and reminded her of patient's repeat Depakote lab. She checked her calendar and stated she would bring him on Fri. This RN advised to come between 8 am and 12 noon as office closes at noon on Fridays. She verbalized understanding, appreciation for reminder call.

## 2018-08-16 NOTE — Telephone Encounter (Signed)
Noted  

## 2018-08-16 NOTE — Telephone Encounter (Signed)
Pts wife called stating the pt is unable to make it for labs today but will be in Monday.

## 2018-08-19 ENCOUNTER — Telehealth: Payer: Self-pay | Admitting: *Deleted

## 2018-08-19 ENCOUNTER — Other Ambulatory Visit (INDEPENDENT_AMBULATORY_CARE_PROVIDER_SITE_OTHER): Payer: Self-pay

## 2018-08-19 DIAGNOSIS — R569 Unspecified convulsions: Secondary | ICD-10-CM

## 2018-08-19 DIAGNOSIS — Z0289 Encounter for other administrative examinations: Secondary | ICD-10-CM

## 2018-08-19 DIAGNOSIS — Z79899 Other long term (current) drug therapy: Secondary | ICD-10-CM

## 2018-08-19 NOTE — Telephone Encounter (Signed)
Patient had repeat Depakote lab drawn today.

## 2018-08-19 NOTE — Telephone Encounter (Signed)
Error

## 2018-08-20 LAB — VALPROIC ACID LEVEL: Valproic Acid Lvl: 123 ug/mL (ref 50–100)

## 2018-08-21 ENCOUNTER — Telehealth: Payer: Self-pay | Admitting: *Deleted

## 2018-08-21 DIAGNOSIS — R569 Unspecified convulsions: Secondary | ICD-10-CM

## 2018-08-21 NOTE — Telephone Encounter (Signed)
Spoke to pts wife.  Explained to have another lab test (VPA level, trough) and instructed to bring medication with him for his morning dose, and take after his labs drawn.  She verbalized understanding.  Will be Monday or Tuesday next week.  Instructed that SE of depakote may wear off overtime, but still would like trough level.

## 2018-08-21 NOTE — Telephone Encounter (Signed)
-----   Message from Melvenia Beam, MD sent at 08/21/2018  8:43 AM EST ----- Valproic acid level is 123 which is fine as long as he is not having any side effects. thanks

## 2018-08-21 NOTE — Telephone Encounter (Signed)
I called and spoke to wife of pt.  She stated that he took his depakote 1000mg  at 0815 prior to having his VPA level drawn at 0930.  His level is 123.  He has been more lethargic, no energy for the past week.  No seizures.  I relayed that will let provider know and if any change to medication dosage will call her back otherwise pt is to keep at 1000mg  po bid.  She verbalized understanding.

## 2018-08-21 NOTE — Telephone Encounter (Signed)
To get a true level he needs to come back and have the last test taken BEFORE he takes his medication that morning. Would you reorder and give him those instructions please? I would suggest giving the medication some time to see if the side effects wear off but make sure he comes back for a trough level.

## 2018-08-26 ENCOUNTER — Telehealth: Payer: Self-pay | Admitting: Diagnostic Neuroimaging

## 2018-08-26 ENCOUNTER — Other Ambulatory Visit (INDEPENDENT_AMBULATORY_CARE_PROVIDER_SITE_OTHER): Payer: Self-pay

## 2018-08-26 DIAGNOSIS — R569 Unspecified convulsions: Secondary | ICD-10-CM

## 2018-08-26 DIAGNOSIS — Z0289 Encounter for other administrative examinations: Secondary | ICD-10-CM

## 2018-08-26 MED ORDER — DIVALPROEX SODIUM 500 MG PO DR TAB: 1000.0000 mg | DELAYED_RELEASE_TABLET | Freq: Two times a day (BID) | ORAL | 4 refills | Status: DC

## 2018-08-26 MED FILL — DIVALPROEX SOD DR 500 MG TA: 500 | 30 days supply | Qty: 120 | Fill #0

## 2018-08-26 MED FILL — TORSEMIDE 20 MG TABLET: 20 | 30 days supply | Qty: 30 | Fill #2

## 2018-08-26 NOTE — Telephone Encounter (Signed)
Pt is needing a refill on DEPAKOTE 500MG  BID and would like to pick up at [facility] pt pharmacy

## 2018-08-26 NOTE — Telephone Encounter (Signed)
Spoke with wife on [name] and advised her Dr Leta Baptist refilled depakote on 07/24/18 x 1 year to [facility] home delivery. She stated they have not received the medication, and they have had issues with [employer]. She stated they are no longer going to use [facility] home delivery. She requested Depakote Rx be sent to [facility]. She stated he will run out of medication tomorrow.  This RN advised will send refill today. Also advised that he will get a call when depakote level (drawn today) is ready. She verbalized understanding, appreciation.

## 2018-08-27 LAB — VALPROIC ACID LEVEL: VALPROIC ACID LVL: 84 ug/mL (ref 50–100)

## 2018-08-27 MED FILL — LATANOPROST 0.005% EYE DRP: 0.005 | 18 days supply | Qty: 3 | Fill #0

## 2018-08-28 DIAGNOSIS — G1223 Primary lateral sclerosis: Secondary | ICD-10-CM | POA: Insufficient documentation

## 2018-08-28 DIAGNOSIS — R252 Cramp and spasm: Secondary | ICD-10-CM | POA: Diagnosis not present

## 2018-08-28 DIAGNOSIS — R1310 Dysphagia, unspecified: Secondary | ICD-10-CM | POA: Diagnosis not present

## 2018-08-28 DIAGNOSIS — I69334 Monoplegia of upper limb following cerebral infarction affecting left non-dominant side: Secondary | ICD-10-CM | POA: Diagnosis not present

## 2018-08-28 DIAGNOSIS — R69 Illness, unspecified: Secondary | ICD-10-CM | POA: Diagnosis not present

## 2018-08-28 DIAGNOSIS — R269 Unspecified abnormalities of gait and mobility: Secondary | ICD-10-CM | POA: Diagnosis not present

## 2018-08-28 DIAGNOSIS — R471 Dysarthria and anarthria: Secondary | ICD-10-CM | POA: Diagnosis not present

## 2018-08-28 DIAGNOSIS — Z7982 Long term (current) use of aspirin: Secondary | ICD-10-CM | POA: Diagnosis not present

## 2018-08-28 DIAGNOSIS — Z8673 Personal history of transient ischemic attack (TIA), and cerebral infarction without residual deficits: Secondary | ICD-10-CM | POA: Diagnosis not present

## 2018-08-28 DIAGNOSIS — R0602 Shortness of breath: Secondary | ICD-10-CM | POA: Diagnosis not present

## 2018-08-28 DIAGNOSIS — R531 Weakness: Secondary | ICD-10-CM | POA: Diagnosis not present

## 2018-08-28 DIAGNOSIS — R569 Unspecified convulsions: Secondary | ICD-10-CM | POA: Diagnosis not present

## 2018-08-28 DIAGNOSIS — Z79899 Other long term (current) drug therapy: Secondary | ICD-10-CM | POA: Diagnosis not present

## 2018-08-28 NOTE — Telephone Encounter (Signed)
Continue current dosing. -VRP

## 2018-08-28 NOTE — Telephone Encounter (Signed)
Spoke with wife and advised his labs came back in therapeutic range. No change in his depakote medication dose,  500 mg tabs, take two tabs twice daily. She verbalized understanding. She then stated patient was at [facility] today and was to have labs drawn. He would have had to wait over 1 hour. She asked if labs can be drawn in this office. This RN advised she would need to bring lab orders in and this RN would check with [employer]. She stated she would come tomorrow. This RN advised her she may have to wait until this RN is not with a patient. She verbalized understanding, appreciation.

## 2018-08-29 NOTE — Telephone Encounter (Signed)
Patient's wife came in with outside lab orders. Talked with [facility] staff who stated she cannot draw labs ordered by outside physician.   Advised wife of this and gave her two addresses to [facility] clinic where he can get labs drawn. She verbalized appreciation.

## 2018-08-30 DIAGNOSIS — G1223 Primary lateral sclerosis: Secondary | ICD-10-CM | POA: Diagnosis not present

## 2018-09-09 MED FILL — METOPROLOL TARTRATE 50 MG T: 50 | 30 days supply | Qty: 60 | Fill #1

## 2018-09-12 ENCOUNTER — Ambulatory Visit: Payer: Medicare HMO | Attending: Family Medicine | Admitting: Family Medicine

## 2018-09-12 ENCOUNTER — Encounter: Payer: Self-pay | Admitting: Gastroenterology

## 2018-09-12 ENCOUNTER — Encounter: Payer: Self-pay | Admitting: Family Medicine

## 2018-09-12 VITALS — BP 115/79 | HR 61 | Temp 97.9°F

## 2018-09-12 DIAGNOSIS — G6181 Chronic inflammatory demyelinating polyneuritis: Secondary | ICD-10-CM | POA: Insufficient documentation

## 2018-09-12 DIAGNOSIS — E7800 Pure hypercholesterolemia, unspecified: Secondary | ICD-10-CM | POA: Insufficient documentation

## 2018-09-12 DIAGNOSIS — E213 Hyperparathyroidism, unspecified: Secondary | ICD-10-CM | POA: Diagnosis not present

## 2018-09-12 DIAGNOSIS — H35033 Hypertensive retinopathy, bilateral: Secondary | ICD-10-CM | POA: Insufficient documentation

## 2018-09-12 DIAGNOSIS — Z888 Allergy status to other drugs, medicaments and biological substances status: Secondary | ICD-10-CM | POA: Insufficient documentation

## 2018-09-12 DIAGNOSIS — R5383 Other fatigue: Secondary | ICD-10-CM | POA: Diagnosis not present

## 2018-09-12 DIAGNOSIS — R69 Illness, unspecified: Secondary | ICD-10-CM | POA: Diagnosis not present

## 2018-09-12 DIAGNOSIS — Z950 Presence of cardiac pacemaker: Secondary | ICD-10-CM | POA: Insufficient documentation

## 2018-09-12 DIAGNOSIS — R569 Unspecified convulsions: Secondary | ICD-10-CM | POA: Diagnosis not present

## 2018-09-12 DIAGNOSIS — R7303 Prediabetes: Secondary | ICD-10-CM | POA: Diagnosis not present

## 2018-09-12 DIAGNOSIS — N183 Chronic kidney disease, stage 3 (moderate): Secondary | ICD-10-CM | POA: Insufficient documentation

## 2018-09-12 DIAGNOSIS — I10 Essential (primary) hypertension: Secondary | ICD-10-CM | POA: Diagnosis not present

## 2018-09-12 DIAGNOSIS — R252 Cramp and spasm: Secondary | ICD-10-CM

## 2018-09-12 DIAGNOSIS — R0982 Postnasal drip: Secondary | ICD-10-CM | POA: Diagnosis not present

## 2018-09-12 DIAGNOSIS — Z7982 Long term (current) use of aspirin: Secondary | ICD-10-CM | POA: Insufficient documentation

## 2018-09-12 DIAGNOSIS — I441 Atrioventricular block, second degree: Secondary | ICD-10-CM | POA: Insufficient documentation

## 2018-09-12 DIAGNOSIS — M1990 Unspecified osteoarthritis, unspecified site: Secondary | ICD-10-CM | POA: Insufficient documentation

## 2018-09-12 DIAGNOSIS — I129 Hypertensive chronic kidney disease with stage 1 through stage 4 chronic kidney disease, or unspecified chronic kidney disease: Secondary | ICD-10-CM | POA: Insufficient documentation

## 2018-09-12 DIAGNOSIS — I69354 Hemiplegia and hemiparesis following cerebral infarction affecting left non-dominant side: Secondary | ICD-10-CM | POA: Diagnosis not present

## 2018-09-12 DIAGNOSIS — Z9889 Other specified postprocedural states: Secondary | ICD-10-CM | POA: Insufficient documentation

## 2018-09-12 DIAGNOSIS — Z79899 Other long term (current) drug therapy: Secondary | ICD-10-CM | POA: Insufficient documentation

## 2018-09-12 DIAGNOSIS — N179 Acute kidney failure, unspecified: Secondary | ICD-10-CM | POA: Diagnosis not present

## 2018-09-12 DIAGNOSIS — Z881 Allergy status to other antibiotic agents status: Secondary | ICD-10-CM | POA: Insufficient documentation

## 2018-09-12 DIAGNOSIS — Z91013 Allergy to seafood: Secondary | ICD-10-CM | POA: Insufficient documentation

## 2018-09-12 DIAGNOSIS — K5289 Other specified noninfective gastroenteritis and colitis: Secondary | ICD-10-CM | POA: Diagnosis not present

## 2018-09-12 DIAGNOSIS — H409 Unspecified glaucoma: Secondary | ICD-10-CM | POA: Insufficient documentation

## 2018-09-12 LAB — POCT GLYCOSYLATED HEMOGLOBIN (HGB A1C): HBA1C, POC (PREDIABETIC RANGE): 6.2 % (ref 5.7–6.4)

## 2018-09-12 MED ORDER — LISINOPRIL 10 MG PO TABS: 10.0000 mg | ORAL_TABLET | Freq: Every day | ORAL | 1 refills | Status: DC

## 2018-09-12 MED ORDER — PNEUMOCOCCAL 13-VAL CONJ VACC IM SUSP: 0.5000 mL | INTRAMUSCULAR | 0 refills | Status: AC

## 2018-09-12 MED ORDER — CETIRIZINE HCL 10 MG PO TABS: 10.0000 mg | ORAL_TABLET | Freq: Every day | ORAL | 1 refills | Status: DC

## 2018-09-12 MED ORDER — ATORVASTATIN CALCIUM 40 MG PO TABS: 40.0000 mg | ORAL_TABLET | Freq: Every day | ORAL | 1 refills | Status: DC

## 2018-09-12 MED ORDER — AMLODIPINE BESYLATE 10 MG PO TABS: 10.0000 mg | ORAL_TABLET | Freq: Every morning | ORAL | 1 refills | Status: DC

## 2018-09-12 MED ORDER — HYDRALAZINE HCL 50 MG PO TABS: 50.0000 mg | ORAL_TABLET | Freq: Three times a day (TID) | ORAL | 1 refills | Status: DC

## 2018-09-12 MED FILL — PREVNAR 13 SYRINGE: 1 days supply | Qty: 1 | Fill #0

## 2018-09-12 NOTE — Progress Notes (Signed)
Subjective:  Patient ID: Darrell [name], male    DOB: June 07, 1951  Age: 67 y.o. MRN: [id number]  CC: Hypertension   HPI Darrell [name] is a 67 year old male with a history of hypertension, previous CVA with residual left-sided weakness, seizure in the setting of prior CVA, second degree AV block (status post medtronic dual chamber pacemaker) stage III CKD who presents today for a follow-up visit.  He is accompanied by his spouse today who complains the patient has had some fatigue but has had a good appetite and has in fact gained 8 pounds in the last 3 months. 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 He is wheelchair-bound but his spouse endorses progressive dysphagia, dysarthria but no dyspnea or respiratory difficulties but he has to chew his food slowly with ingestion of soft mechanical diet to prevent choking.  After his stroke in 2011 he suffered mild left hemiparesis He has had no recent seizures and no recent falls and endorses compliance with his medications. He continues to smoke a couple of cigarettes per day.  Past Medical History:  Diagnosis Date  . Arthritis   . At high risk for falls 08/16/2015  . Bradycardia   . Chronic kidney disease    stage 3 GFR 30-59 ml/min   . CIDP (chronic inflammatory demyelinating polyneuropathy) ([place])   . CKD (chronic kidney disease) stage 3, GFR 30-59 ml/min ([name]) 08/16/2015  . Dysphagia as late effect of cerebrovascular disease    pts wife states pt has to eat soft foods   . Elevated liver enzymes 08/10/2016  . GERD (gastroesophageal reflux disease)   . Glaucoma   . High cholesterol   . History of CVA with residual deficit 03/25/2013  . Hypertension   . Hypertensive retinopathy of both eyes 01/16/2017  . Inguinal hernia 03/25/2013  . Liver hemangioma  08/14/2016  . Neuromuscular disorder ([place])    chronic inflammatory demyelinating polyneuropathy   . New onset seizure ([place]) 07/08/2017   seizure 07/14/18  . Nuclear sclerosis of both eyes 10/25/2016  . Presence of permanent cardiac pacemaker    placed in april 2018  . Primary open angle glaucoma of both eyes, indeterminate stage 10/25/2016  . Renal mass, right 08/14/2016  . Status cardiac pacemaker 01/29/2017   Placed for second degree heart block on 01/16/17 Medtronic Azure XT DR MRI SureScan dual-chamber pacemaker  . Stroke [facility])    2011 with residual deficit left sided weakness  . Tobacco dependence     Past Surgical History:  Procedure Laterality Date  . EYE SURGERY    . HERNIA REPAIR    . INGUINAL HERNIA REPAIR Right 11/23/2014   Procedure: right inguinal hernia repair with mesh;  Surgeon: Armandina Gemma, MD;  Location: [facility];  Service: General;  Laterality: Right;  . INSERTION OF MESH N/A 11/23/2014   Procedure: INSERTION OF MESH;  Surgeon: Armandina Gemma, MD;  Location: [facility];  Service: General;  Laterality: N/A;  . MASS EXCISION Left 08/29/2017   Procedure: EXCISION OF LEFT NECK MASS;  Surgeon: Coralie Keens, MD;  Location: [place];  Service: General;  Laterality: Left;  . PACEMAKER IMPLANT N/A 01/16/2017   Procedure: Pacemaker Implant;  Surgeon: Will Meredith Leeds, MD;  Location: [place] [facility];  Service: Cardiovascular;  Laterality: N/A;  . SHOULDER SURGERY Bilateral 1988, 1998  Allergies  Allergen Reactions  . Ace Inhibitors Other (See Comments)    Hyperkalemia  . Doxycycline Other (See Comments)    Hiccups, cough, nausea and emesis, elevated liver enzymes, elevated eosinophils, SOB concerning for early DRESS syndrome   . Atacand Hct [Candesartan Cilexetil-Hctz] Hives  . Shellfish Allergy Hives     Outpatient Medications Prior to Visit  Medication Sig Dispense Refill  . aspirin EC 81 MG tablet Take 1 tablet (81 mg total) by mouth daily. 90 tablet 3  . brimonidine  (ALPHAGAN P) 0.1 % SOLN Place 1 drop into both eyes daily.     . divalproex (DEPAKOTE) 500 MG DR tablet Take 2 tablets (1,000 mg total) by mouth 2 (two) times daily. 360 tablet 4  . dorzolamide-timolol (COSOPT) 22.3-6.8 MG/ML ophthalmic solution Place 1 drop into both eyes daily.     Marland Kitchen ketoconazole (NIZORAL) 2 % cream Apply 1 application topically daily as needed for irritation.    Marland Kitchen latanoprost (XALATAN) 0.005 % ophthalmic solution Place 1 drop into both eyes at bedtime.    . metoprolol tartrate (LOPRESSOR) 50 MG tablet Take 1 tablet (50 mg total) by mouth 2 (two) times daily. 180 tablet 2  . Multiple Vitamins-Iron (DAILY VITAMIN FORMULA+IRON) TABS Take 1 tablet by mouth daily.    . polyethylene glycol (MIRALAX / GLYCOLAX) packet Take 17 g by mouth daily as needed for mild constipation. 30 each 3  . Sennosides-Docusate Sodium (SENNA S PO) Take 1 tablet by mouth daily as needed (constipation).     . torsemide (DEMADEX) 20 MG tablet Take 20 mg by mouth daily.    Marland Kitchen amLODipine (NORVASC) 10 MG tablet Take 1 tablet (10 mg total) by mouth every morning. 90 tablet 1  . atorvastatin (LIPITOR) 40 MG tablet Take 1 tablet (40 mg total) by mouth daily. 90 tablet 1  . hydrALAZINE (APRESOLINE) 50 MG tablet Take 1 tablet (50 mg total) by mouth 3 (three) times daily. 270 tablet 1  . lisinopril (PRINIVIL,ZESTRIL) 10 MG tablet Take 1 tablet (10 mg total) by mouth daily. 90 tablet 1  . acetaminophen (TYLENOL) 325 MG tablet Take 325 mg by mouth every 6 (six) hours as needed for mild pain or headache. Take 2 tablets every 6 hours as needed for fever greater than 101.     . fluticasone (FLONASE) 50 MCG/ACT nasal spray Place 2 sprays into both nostrils daily. (Patient not taking: Reported on 62/69/4854) 16 g 6  . folic acid (FOLVITE) 1 MG tablet Take 1 tablet (1 mg total) by mouth daily. (Patient not taking: Reported on 07/24/2018) 90 tablet 1  . terbinafine (LAMISIL AT) 1 % cream Apply 1 application topically 2 (two)  times daily. To R foot (Patient not taking: Reported on 07/24/2018) 30 g 0  . thiamine 100 MG tablet Take 1 tablet (100 mg total) by mouth daily. (Patient not taking: Reported on 07/24/2018) 30 tablet 0   No facility-administered medications prior to visit.     ROS Review of Systems  Constitutional: Negative for activity change and appetite change.  HENT: Negative for sinus pressure and sore throat.   Eyes: Negative for visual disturbance.  Respiratory: Negative for cough, chest tightness and shortness of breath.   Cardiovascular: Negative for chest pain and leg swelling.  Gastrointestinal: Negative for abdominal distention, abdominal pain, constipation and diarrhea.  Endocrine: Negative.   Genitourinary: Negative for dysuria.  Musculoskeletal: Negative for joint swelling and myalgias.  Skin: Negative for rash.  Allergic/Immunologic: Negative.   Neurological:  Positive for weakness. Negative for light-headedness and numbness.  Psychiatric/Behavioral: Negative for dysphoric mood and suicidal ideas.    Objective:  BP 115/79   Pulse 61   Temp 97.9 F (36.6 C) (Oral)   SpO2 99%   BP/Weight 09/12/2018 07/24/2018 19/50/9326  Systolic BP 712 458 099  Diastolic BP 79 84 76  Wt. (Lbs) - 170 171  BMI - 21.83 21.96     Physical Exam Constitutional:      Appearance: He is well-developed.     Comments: Sitting comfortably in a wheelchair  Cardiovascular:     Rate and Rhythm: Normal rate.     Heart sounds: Normal heart sounds. No murmur.  Pulmonary:     Effort: Pulmonary effort is normal.     Breath sounds: Normal breath sounds. No wheezing or rales.  Chest:     Chest wall: No tenderness.  Abdominal:     General: Bowel sounds are normal. There is no distension.     Palpations: Abdomen is soft. There is no mass.     Tenderness: There is no abdominal tenderness.  Musculoskeletal: Normal range of motion.  Neurological:     Mental Status: He is alert and oriented to person,  place, and time.     Comments: Motor strength: Left upper extremity-4/5; left lower extremity-4+/5 Right upper extremity and right lower extremity-5/5   Psychiatric:        Mood and Affect: Mood normal.        Behavior: Behavior normal.      Assessment & Plan:   1. Essential hypertension Controlled Low-sodium diet - amLODipine (NORVASC) 10 MG tablet; Take 1 tablet (10 mg total) by mouth every morning.  Dispense: 90 tablet; Refill: 1 - hydrALAZINE (APRESOLINE) 50 MG tablet; Take 1 tablet (50 mg total) by mouth 3 (three) times daily.  Dispense: 270 tablet; Refill: 1 - lisinopril (PRINIVIL,ZESTRIL) 10 MG tablet; Take 1 tablet (10 mg total) by mouth daily.  Dispense: 90 tablet; Refill: 1  2. Hemiparesis affecting left side as late effect of stroke ([name]) Stable Continue PT - atorvastatin (LIPITOR) 40 MG tablet; Take 1 tablet (40 mg total) by mouth daily.  Dispense: 90 tablet; Refill: 1  3. Other fatigue We will check vitamin D level - VITAMIN D 25 Hydroxy (Vit-D Deficiency, Fractures)  4. 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 Seizures ([name]) No recent seizures Continue Depakote  6. Post-nasal drip Advised to use Zyrtec  7. Prediabetes Discussed diabetic diet, lifestyle modifications to prevent development of diabetes - POCT glycosylated hemoglobin (Hb A1C)   Meds ordered this encounter  Medications  . cetirizine (ZYRTEC) 10 MG tablet    Sig: Take 1 tablet (10 mg total) by mouth daily.    Dispense:  30 tablet    Refill:  1  . amLODipine (NORVASC) 10 MG tablet    Sig: Take 1 tablet (10 mg total) by mouth every morning.    Dispense:  90 tablet    Refill:  1  . atorvastatin (LIPITOR) 40 MG tablet    Sig: Take 1 tablet (40 mg total) by mouth daily.    Dispense:  90 tablet    Refill:  1  . hydrALAZINE (APRESOLINE) 50 MG tablet    Sig: Take 1 tablet (50 mg  total) by mouth 3 (three) times daily.    Dispense:  270 tablet    Refill:  1  . lisinopril (  PRINIVIL,ZESTRIL) 10 MG tablet    Sig: Take 1 tablet (10 mg total) by mouth daily.    Dispense:  90 tablet    Refill:  1  . pneumococcal 13-valent conjugate vaccine (PREVNAR 13) SUSP injection    Sig: Inject 0.5 mLs into the muscle tomorrow at 10 am for 1 dose.    Dispense:  0.5 mL    Refill:  0    Follow-up: Return in about 6 months (around 03/14/2019) for Follow-up of chronic medical conditions.   [name] MD

## 2018-09-13 ENCOUNTER — Telehealth: Payer: Self-pay | Admitting: *Deleted

## 2018-09-13 LAB — VITAMIN D 25 HYDROXY (VIT D DEFICIENCY, FRACTURES): VIT D 25 HYDROXY: 23.5 ng/mL — AB (ref 30.0–100.0)

## 2018-09-13 MED ORDER — ERGOCALCIFEROL 1.25 MG (50000 UT) PO CAPS: 50000.0000 [IU] | ORAL_CAPSULE | ORAL | 1 refills | Status: DC

## 2018-09-13 MED FILL — VIT D2 1.25 MG (50,000 UNIT: 1.25 MG | 28 days supply | Qty: 4 | Fill #0

## 2018-09-13 NOTE — Telephone Encounter (Signed)
Pt is scheduled for a PV 12/26 with a direct screening colon 09-26-2018. He has a very extensive complicated medical history- he has new on set seizures- last time pt had a seizure was 07-14-18 x 2- he has a pacemaker, hx of CVA, CKD 3, 2 degree heart block, high risk for falls, uses wheelchair, chart states he can only stand briefly and take 1-2 steps with a wlaker- wife does all his transfers and assists with all movements, brady- this pt needs an OV with Dr Rush Landmark before we do a colon.  Called home number- LM for wife to call me back so I can schedule an OV with this pt, he has NO GI HX>    Darrell [name]

## 2018-09-13 NOTE — Telephone Encounter (Signed)
Explained to Mrs Plagge with Mr Kautzman complicated med hx he needs an OV- sch with Dr Rush Landmark 1-17 Friday 2 pm, to arrive 145 pm 3rd floor I cancelled colon and PV that was scheduled- pt's wife aware- wife states he cant stand to transfer alone- will need assistance- explained he may have to have colon at hospital- she was very appreciative of [place] being through with his care   Darrell [name]

## 2018-09-23 ENCOUNTER — Ambulatory Visit (INDEPENDENT_AMBULATORY_CARE_PROVIDER_SITE_OTHER): Payer: Medicare HMO

## 2018-09-23 DIAGNOSIS — I441 Atrioventricular block, second degree: Secondary | ICD-10-CM | POA: Diagnosis not present

## 2018-09-23 NOTE — Progress Notes (Signed)
Remote pacemaker transmission.   

## 2018-09-24 LAB — CUP PACEART REMOTE DEVICE CHECK
Brady Statistic AP VS Percent: 76.8 %
Brady Statistic AS VP Percent: 0.03 %
Brady Statistic AS VS Percent: 23.14 %
Brady Statistic RA Percent Paced: 76.7 %
Brady Statistic RV Percent Paced: 0.05 %
Date Time Interrogation Session: 20191230052846
Implantable Lead Implant Date: 20180424
Implantable Lead Location: 753859
Implantable Lead Location: 753860
Implantable Lead Model: 5076
Lead Channel Impedance Value: 323 Ohm
Lead Channel Pacing Threshold Amplitude: 0.5 V
Lead Channel Pacing Threshold Pulse Width: 0.4 ms
Lead Channel Pacing Threshold Pulse Width: 0.4 ms
Lead Channel Sensing Intrinsic Amplitude: 0.5 mV
Lead Channel Sensing Intrinsic Amplitude: 0.5 mV
Lead Channel Sensing Intrinsic Amplitude: 7.375 mV
Lead Channel Sensing Intrinsic Amplitude: 7.375 mV
Lead Channel Setting Pacing Amplitude: 1.5 V
Lead Channel Setting Pacing Amplitude: 2.5 V
Lead Channel Setting Pacing Pulse Width: 0.4 ms
MDC IDC LEAD IMPLANT DT: 20180424
MDC IDC MSMT BATTERY REMAINING LONGEVITY: 119 mo
MDC IDC MSMT BATTERY VOLTAGE: 3.02 V
MDC IDC MSMT LEADCHNL RA IMPEDANCE VALUE: 399 Ohm
MDC IDC MSMT LEADCHNL RA PACING THRESHOLD AMPLITUDE: 0.75 V
MDC IDC MSMT LEADCHNL RV IMPEDANCE VALUE: 361 Ohm
MDC IDC MSMT LEADCHNL RV IMPEDANCE VALUE: 418 Ohm
MDC IDC PG IMPLANT DT: 20180424
MDC IDC SET LEADCHNL RV SENSING SENSITIVITY: 2 mV
MDC IDC STAT BRADY AP VP PERCENT: 0.02 %

## 2018-09-26 ENCOUNTER — Encounter: Payer: Medicare HMO | Admitting: Gastroenterology

## 2018-09-27 MED FILL — DIVALPROEX SOD DR 500 MG TA: 500 | 30 days supply | Qty: 120 | Fill #1

## 2018-09-27 MED FILL — TORSEMIDE 20 MG TABLET: 20 | 30 days supply | Qty: 30 | Fill #3

## 2018-10-07 ENCOUNTER — Telehealth: Payer: Self-pay | Admitting: Family Medicine

## 2018-10-07 NOTE — Telephone Encounter (Signed)
Patient verified DOB MA will contact wife back with advice from PCP.

## 2018-10-07 NOTE — Telephone Encounter (Signed)
Wife Darrell [name] pt labs at [facility] December 2019 showed abnormal & provider there informed pt to contact PCP. Gave pt appt 10/29/18 with Asencion Noble since PCP [place] has no appts until March 2020. Please call pt wife to reassure it is ok to wait until February 2020.

## 2018-10-11 ENCOUNTER — Ambulatory Visit: Payer: Medicare HMO | Admitting: Gastroenterology

## 2018-10-11 NOTE — Telephone Encounter (Signed)
Appointment reschedule for patient to see PCP. Pt wife aware.

## 2018-10-14 ENCOUNTER — Encounter: Payer: Self-pay | Admitting: Family Medicine

## 2018-10-14 ENCOUNTER — Ambulatory Visit: Payer: Medicare HMO | Attending: Family Medicine | Admitting: Family Medicine

## 2018-10-14 VITALS — BP 126/83 | HR 64 | Temp 97.7°F | Ht 74.0 in

## 2018-10-14 DIAGNOSIS — N183 Chronic kidney disease, stage 3 unspecified: Secondary | ICD-10-CM

## 2018-10-14 DIAGNOSIS — E213 Hyperparathyroidism, unspecified: Secondary | ICD-10-CM

## 2018-10-14 DIAGNOSIS — E559 Vitamin D deficiency, unspecified: Secondary | ICD-10-CM

## 2018-10-14 DIAGNOSIS — I10 Essential (primary) hypertension: Secondary | ICD-10-CM | POA: Diagnosis not present

## 2018-10-14 MED ORDER — HYDRALAZINE HCL 100 MG PO TABS: 100.0000 mg | ORAL_TABLET | Freq: Three times a day (TID) | ORAL | 1 refills | Status: DC

## 2018-10-14 NOTE — Progress Notes (Signed)
Subjective:  Patient ID: Darrell [name], male    DOB: Nov 18, 1950  Age: 68 y.o. MRN: [id number]  CC: Hypertension   HPI Darrell [name] is a 68 year old male with a history of hypertension, previous CVA with residual left-sided weakness, seizure in the setting of prior CVA, second degree AV block (status post medtronic dual chamber pacemaker) stage III CKD who presents today accompanied by his spouse with concerns after receiving a letter from [facility] which indicated he had an elevated parathyroid hormone level of 97 (normal 15-65). Review of his most recent labs reveal a normal calcium, last vit D was 23.5 and he is currently on Drisdol. His chronic kidney disease is managed by nephrology and he has an upcoming appointment. I had seen him last month for follow-up of his chronic medical conditions and he complains of taking too many medications today and would like to come off some of them.  Past Medical History:  Diagnosis Date  . Arthritis   . At high risk for falls 08/16/2015  . Bradycardia   . Chronic kidney disease    stage 3 GFR 30-59 ml/min   . CIDP (chronic inflammatory demyelinating polyneuropathy) ([place])   . CKD (chronic kidney disease) stage 3, GFR 30-59 ml/min ([name]) 08/16/2015  . Dysphagia as late effect of cerebrovascular disease    pts wife states pt has to eat soft foods   . Elevated liver enzymes 08/10/2016  . GERD (gastroesophageal reflux disease)   . Glaucoma   . High cholesterol   . History of CVA with residual deficit 03/25/2013  . Hypertension   . Hypertensive retinopathy of both eyes 01/16/2017  . Inguinal hernia 03/25/2013  . Liver hemangioma 08/14/2016  . Neuromuscular disorder ([place])    chronic inflammatory demyelinating polyneuropathy   . New onset seizure ([place]) 07/08/2017   seizure 07/14/18  . Nuclear sclerosis of both eyes 10/25/2016  . Presence of permanent cardiac pacemaker    placed in april 2018  . Primary open angle glaucoma of both  eyes, indeterminate stage 10/25/2016  . Renal mass, right 08/14/2016  . Status cardiac pacemaker 01/29/2017   Placed for second degree heart block on 01/16/17 Medtronic Azure XT DR MRI SureScan dual-chamber pacemaker  . Stroke [facility])    2011 with residual deficit left sided weakness  . Tobacco dependence     Past Surgical History:  Procedure Laterality Date  . EYE SURGERY    . HERNIA REPAIR    . INGUINAL HERNIA REPAIR Right 11/23/2014   Procedure: right inguinal hernia repair with mesh;  Surgeon: Armandina Gemma, MD;  [facility];  Service: General;  Laterality: Right;  . INSERTION OF MESH N/A 11/23/2014   Procedure: INSERTION OF MESH;  Surgeon: Armandina Gemma, MD;  [facility];  Service: General;  Laterality: N/A;  . MASS EXCISION Left 08/29/2017   Procedure: EXCISION OF LEFT NECK MASS;  Surgeon: Coralie Keens, MD;  Location: [place];  Service: General;  Laterality: Left;  . PACEMAKER IMPLANT N/A 01/16/2017   Procedure: Pacemaker Implant;  Surgeon: Will Meredith Leeds, MD;  Location: [place] [facility];  Service: Cardiovascular;  Laterality: N/A;  . SHOULDER SURGERY Bilateral 1988, 1998    Allergies  Allergen Reactions  . Ace Inhibitors Other (See Comments)    Hyperkalemia  . Doxycycline Other (See Comments)    Hiccups, cough, nausea and emesis, elevated liver enzymes, elevated eosinophils, SOB concerning for early DRESS syndrome   . Atacand Hct [Candesartan Cilexetil-Hctz] Hives  .  Shellfish Allergy Hives      Outpatient Medications Prior to Visit  Medication Sig Dispense Refill  . aspirin EC 81 MG tablet Take 1 tablet (81 mg total) by mouth daily. 90 tablet 3  . atorvastatin (LIPITOR) 40 MG tablet Take 1 tablet (40 mg total) by mouth daily. 90 tablet 1  . brimonidine (ALPHAGAN P) 0.1 % SOLN Place 1 drop into both eyes daily.     . cetirizine (ZYRTEC) 10 MG tablet Take 1 tablet (10 mg total) by mouth daily. 30 tablet 1  . divalproex (DEPAKOTE) 500 MG DR tablet Take 2 tablets  (1,000 mg total) by mouth 2 (two) times daily. 360 tablet 4  . dorzolamide-timolol (COSOPT) 22.3-6.8 MG/ML ophthalmic solution Place 1 drop into both eyes daily.     . ergocalciferol (DRISDOL) 1.25 MG (50000 UT) capsule Take 1 capsule (50,000 Units total) by mouth once a week. 9 capsule 1  . latanoprost (XALATAN) 0.005 % ophthalmic solution Place 1 drop into both eyes at bedtime.    Marland Kitchen lisinopril (PRINIVIL,ZESTRIL) 10 MG tablet Take 1 tablet (10 mg total) by mouth daily. 90 tablet 1  . metoprolol tartrate (LOPRESSOR) 50 MG tablet Take 1 tablet (50 mg total) by mouth 2 (two) times daily. 180 tablet 2  . Multiple Vitamins-Iron (DAILY VITAMIN FORMULA+IRON) TABS Take 1 tablet by mouth daily.    . polyethylene glycol (MIRALAX / GLYCOLAX) packet Take 17 g by mouth daily as needed for mild constipation. 30 each 3  . Sennosides-Docusate Sodium (SENNA S PO) Take 1 tablet by mouth daily as needed (constipation).     . torsemide (DEMADEX) 20 MG tablet Take 20 mg by mouth daily.    Marland Kitchen amLODipine (NORVASC) 10 MG tablet Take 1 tablet (10 mg total) by mouth every morning. 90 tablet 1  . hydrALAZINE (APRESOLINE) 50 MG tablet Take 1 tablet (50 mg total) by mouth 3 (three) times daily. 270 tablet 1  . acetaminophen (TYLENOL) 325 MG tablet Take 325 mg by mouth every 6 (six) hours as needed for mild pain or headache. Take 2 tablets every 6 hours as needed for fever greater than 101.     . fluticasone (FLONASE) 50 MCG/ACT nasal spray Place 2 sprays into both nostrils daily. (Patient not taking: Reported on 76/16/0737) 16 g 6  . folic acid (FOLVITE) 1 MG tablet Take 1 tablet (1 mg total) by mouth daily. (Patient not taking: Reported on 07/24/2018) 90 tablet 1  . ketoconazole (NIZORAL) 2 % cream Apply 1 application topically daily as needed for irritation.    . terbinafine (LAMISIL AT) 1 % cream Apply 1 application topically 2 (two) times daily. To R foot (Patient not taking: Reported on 07/24/2018) 30 g 0  . thiamine 100  MG tablet Take 1 tablet (100 mg total) by mouth daily. (Patient not taking: Reported on 07/24/2018) 30 tablet 0   No facility-administered medications prior to visit.     ROS Review of Systems  Constitutional: Negative for activity change and appetite change.  HENT: Negative for sinus pressure and sore throat.   Eyes: Negative for visual disturbance.  Respiratory: Negative for cough, chest tightness and shortness of breath.   Cardiovascular: Negative for chest pain and leg swelling.  Gastrointestinal: Negative for abdominal distention, abdominal pain, constipation and diarrhea.  Endocrine: Negative.   Genitourinary: Negative for dysuria.  Musculoskeletal: Negative for joint swelling and myalgias.  Skin: Negative for rash.  Allergic/Immunologic: Negative.   Neurological: Positive for weakness. Negative for light-headedness and  numbness.  Psychiatric/Behavioral: Negative for dysphoric mood and suicidal ideas.    Objective:  BP 126/83   Pulse 64   Temp 97.7 F (36.5 C) (Oral)   Ht 6\' 2"  (1.88 m)   SpO2 100%   BMI 21.83 kg/m   BP/Weight 10/14/2018 09/12/2018 46/56/8127  Systolic BP 517 001 749  Diastolic BP 83 79 84  Wt. (Lbs) - - 170  BMI 21.83 - 21.83      Physical Exam Constitutional:      Appearance: He is well-developed.  Cardiovascular:     Rate and Rhythm: Normal rate.     Heart sounds: Normal heart sounds. No murmur.  Pulmonary:     Effort: Pulmonary effort is normal.     Breath sounds: Normal breath sounds. No wheezing or rales.  Chest:     Chest wall: No tenderness.  Abdominal:     General: Bowel sounds are normal. There is no distension.     Palpations: Abdomen is soft. There is no mass.     Tenderness: There is no abdominal tenderness.  Musculoskeletal: Normal range of motion.  Neurological:     Mental Status: He is alert and oriented to person, place, and time.  Psychiatric:        Mood and Affect: Mood normal.        Behavior: Behavior normal.       Assessment & Plan:   1. Essential hypertension Controlled He complains of multiple medications and so I have discontinued amlodipine and increase hydralazine dose - hydrALAZINE (APRESOLINE) 100 MG tablet; Take 1 tablet (100 mg total) by mouth 3 (three) times daily.  Dispense: 270 tablet; Refill: 1  2. CKD (chronic kidney disease) stage 3, GFR 30-59 ml/min ([name]) Likely hypertensive nephropathy Follow-up with nephrologist  3. Vitamin D deficiency Currently on Drisdol  4. Hyperparathyroidism ([place]) Likely secondary hyperparathyroidism from chronic kidney disease He has an upcoming appointment with nephrology -we will defer to nephrology for management   Meds ordered this encounter  Medications  . hydrALAZINE (APRESOLINE) 100 MG tablet    Sig: Take 1 tablet (100 mg total) by mouth 3 (three) times daily.    Dispense:  270 tablet    Refill:  1    Discontinue amlodipine    Follow-up: Return for Follow-up of chronic medical conditions, keep previously scheduled appointment.[name] MD

## 2018-10-15 MED FILL — METOPROLOL TARTRATE 50 MG T: 50 | 30 days supply | Qty: 60 | Fill #2

## 2018-10-15 MED FILL — VIT D2 1.25 MG (50,000 UNIT: 1.25 MG | 28 days supply | Qty: 4 | Fill #1

## 2018-10-29 ENCOUNTER — Emergency Department (HOSPITAL_COMMUNITY)
Admission: EM | Admit: 2018-10-29 | Discharge: 2018-10-29 | Disposition: A | Discharge status: Home / Self Care | Payer: Medicare HMO | Source: Home / Self Care | Attending: Emergency Medicine | Admitting: Emergency Medicine

## 2018-10-29 ENCOUNTER — Emergency Department (HOSPITAL_COMMUNITY)
Admission: EM | Admit: 2018-10-29 | Discharge: 2018-10-29 | Disposition: A | Discharge status: Home / Self Care | Payer: Medicare HMO | Attending: Emergency Medicine | Admitting: Emergency Medicine

## 2018-10-29 ENCOUNTER — Ambulatory Visit: Payer: Medicare HMO | Admitting: Critical Care Medicine

## 2018-10-29 ENCOUNTER — Other Ambulatory Visit: Payer: Self-pay

## 2018-10-29 ENCOUNTER — Encounter (HOSPITAL_COMMUNITY): Payer: Self-pay

## 2018-10-29 ENCOUNTER — Emergency Department (HOSPITAL_COMMUNITY): Payer: Medicare HMO

## 2018-10-29 ENCOUNTER — Encounter (HOSPITAL_COMMUNITY): Payer: Self-pay | Admitting: Emergency Medicine

## 2018-10-29 DIAGNOSIS — F17210 Nicotine dependence, cigarettes, uncomplicated: Secondary | ICD-10-CM | POA: Insufficient documentation

## 2018-10-29 DIAGNOSIS — R339 Retention of urine, unspecified: Secondary | ICD-10-CM | POA: Diagnosis not present

## 2018-10-29 DIAGNOSIS — R319 Hematuria, unspecified: Secondary | ICD-10-CM | POA: Insufficient documentation

## 2018-10-29 DIAGNOSIS — N183 Chronic kidney disease, stage 3 (moderate): Secondary | ICD-10-CM | POA: Insufficient documentation

## 2018-10-29 DIAGNOSIS — I129 Hypertensive chronic kidney disease with stage 1 through stage 4 chronic kidney disease, or unspecified chronic kidney disease: Secondary | ICD-10-CM | POA: Insufficient documentation

## 2018-10-29 DIAGNOSIS — Z79899 Other long term (current) drug therapy: Secondary | ICD-10-CM | POA: Insufficient documentation

## 2018-10-29 DIAGNOSIS — R569 Unspecified convulsions: Secondary | ICD-10-CM

## 2018-10-29 DIAGNOSIS — Z8673 Personal history of transient ischemic attack (TIA), and cerebral infarction without residual deficits: Secondary | ICD-10-CM | POA: Insufficient documentation

## 2018-10-29 DIAGNOSIS — Z960 Presence of urogenital implants: Secondary | ICD-10-CM | POA: Insufficient documentation

## 2018-10-29 DIAGNOSIS — J80 Acute respiratory distress syndrome: Secondary | ICD-10-CM | POA: Diagnosis not present

## 2018-10-29 DIAGNOSIS — Z950 Presence of cardiac pacemaker: Secondary | ICD-10-CM

## 2018-10-29 DIAGNOSIS — R279 Unspecified lack of coordination: Secondary | ICD-10-CM | POA: Diagnosis not present

## 2018-10-29 DIAGNOSIS — E7800 Pure hypercholesterolemia, unspecified: Secondary | ICD-10-CM | POA: Diagnosis not present

## 2018-10-29 DIAGNOSIS — R69 Illness, unspecified: Secondary | ICD-10-CM | POA: Diagnosis not present

## 2018-10-29 DIAGNOSIS — R58 Hemorrhage, not elsewhere classified: Secondary | ICD-10-CM | POA: Diagnosis not present

## 2018-10-29 DIAGNOSIS — R001 Bradycardia, unspecified: Secondary | ICD-10-CM | POA: Diagnosis not present

## 2018-10-29 DIAGNOSIS — D1809 Hemangioma of other sites: Secondary | ICD-10-CM | POA: Insufficient documentation

## 2018-10-29 DIAGNOSIS — G40909 Epilepsy, unspecified, not intractable, without status epilepticus: Secondary | ICD-10-CM | POA: Diagnosis not present

## 2018-10-29 DIAGNOSIS — R404 Transient alteration of awareness: Secondary | ICD-10-CM | POA: Diagnosis not present

## 2018-10-29 DIAGNOSIS — R531 Weakness: Secondary | ICD-10-CM | POA: Diagnosis not present

## 2018-10-29 DIAGNOSIS — Z743 Need for continuous supervision: Secondary | ICD-10-CM | POA: Diagnosis not present

## 2018-10-29 DIAGNOSIS — Z7982 Long term (current) use of aspirin: Secondary | ICD-10-CM | POA: Insufficient documentation

## 2018-10-29 DIAGNOSIS — R0902 Hypoxemia: Secondary | ICD-10-CM | POA: Diagnosis not present

## 2018-10-29 DIAGNOSIS — R059 Cough, unspecified: Secondary | ICD-10-CM

## 2018-10-29 DIAGNOSIS — K5900 Constipation, unspecified: Secondary | ICD-10-CM

## 2018-10-29 DIAGNOSIS — R05 Cough: Secondary | ICD-10-CM | POA: Diagnosis not present

## 2018-10-29 LAB — COMPREHENSIVE METABOLIC PANEL
ALT: 23 U/L (ref 0–44)
ANION GAP: 7 (ref 5–15)
AST: 34 U/L (ref 15–41)
Albumin: 2.9 g/dL — ABNORMAL LOW (ref 3.5–5.0)
Alkaline Phosphatase: 49 U/L (ref 38–126)
BUN: 43 mg/dL — ABNORMAL HIGH (ref 8–23)
CHLORIDE: 100 mmol/L (ref 98–111)
CO2: 31 mmol/L (ref 22–32)
Calcium: 9.7 mg/dL (ref 8.9–10.3)
Creatinine, Ser: 2.48 mg/dL — ABNORMAL HIGH (ref 0.61–1.24)
GFR, EST AFRICAN AMERICAN: 30 mL/min — AB (ref >60)
GFR, EST NON AFRICAN AMERICAN: 26 mL/min — AB (ref >60)
Glucose, Bld: 106 mg/dL — ABNORMAL HIGH (ref 70–99)
POTASSIUM: 4.7 mmol/L (ref 3.5–5.1)
Sodium: 138 mmol/L (ref 135–145)
Total Bilirubin: 0.9 mg/dL (ref 0.3–1.2)
Total Protein: 5.8 g/dL — ABNORMAL LOW (ref 6.5–8.1)

## 2018-10-29 LAB — CBC WITH DIFFERENTIAL/PLATELET
Abs Immature Granulocytes: 0.04 10*3/uL (ref 0.00–0.07)
BASOS PCT: 0 %
Basophils Absolute: 0 10*3/uL (ref 0.0–0.1)
EOS ABS: 0.4 10*3/uL (ref 0.0–0.5)
Eosinophils Relative: 5 %
HCT: 33.6 % — ABNORMAL LOW (ref 39.0–52.0)
Hemoglobin: 10.8 g/dL — ABNORMAL LOW (ref 13.0–17.0)
IMMATURE GRANULOCYTES: 1 %
Lymphocytes Relative: 35 %
Lymphs Abs: 2.9 10*3/uL (ref 0.7–4.0)
MCH: 30.3 pg (ref 26.0–34.0)
MCHC: 32.1 g/dL (ref 30.0–36.0)
MCV: 94.4 fL (ref 80.0–100.0)
MONOS PCT: 12 %
Monocytes Absolute: 1 10*3/uL (ref 0.1–1.0)
NEUTROS PCT: 47 %
NRBC: 0 % (ref 0.0–0.2)
Neutro Abs: 4 10*3/uL (ref 1.7–7.7)
PLATELETS: 163 10*3/uL (ref 150–400)
RBC: 3.56 MIL/uL — ABNORMAL LOW (ref 4.22–5.81)
RDW: 16.7 % — AB (ref 11.5–15.5)
WBC: 8.3 10*3/uL (ref 4.0–10.5)

## 2018-10-29 LAB — VALPROIC ACID LEVEL: VALPROIC ACID LVL: 36 ug/mL — AB (ref 50.0–100.0)

## 2018-10-29 LAB — URINALYSIS, ROUTINE W REFLEX MICROSCOPIC
Bilirubin Urine: NEGATIVE
Glucose, UA: NEGATIVE mg/dL
Hgb urine dipstick: NEGATIVE
Ketones, ur: NEGATIVE mg/dL
LEUKOCYTES UA: NEGATIVE
NITRITE: NEGATIVE
PROTEIN: NEGATIVE mg/dL
Specific Gravity, Urine: 1.013 (ref 1.005–1.030)
pH: 6 (ref 5.0–8.0)

## 2018-10-29 LAB — CBG MONITORING, ED: GLUCOSE-CAPILLARY: 100 mg/dL — AB (ref 70–99)

## 2018-10-29 MED ORDER — VALPROATE SODIUM 500 MG/5ML IV SOLN
1000.0000 mg | Freq: Once | INTRAVENOUS | Status: AC
  Administered 2018-10-29: 1000 mg via INTRAVENOUS
  Filled 2018-10-29: qty 10

## 2018-10-29 MED ORDER — DIVALPROEX SODIUM 250 MG PO DR TAB: 1250.0000 mg | DELAYED_RELEASE_TABLET | Freq: Two times a day (BID) | ORAL | 1 refills | Status: DC

## 2018-10-29 MED ORDER — LORAZEPAM 2 MG/ML IJ SOLN
1.0000 mg | Freq: Once | INTRAMUSCULAR | Status: AC
  Administered 2018-10-29: 1 mg via INTRAVENOUS
  Filled 2018-10-29: qty 1

## 2018-10-29 NOTE — Care Management Note (Signed)
Case Management Note  Patient Details  Name: Darrell [name] MRN: [id number] Date of Birth: 1950-09-28  Subjective/Objective:                  Hematuria  Action/Plan: ED CM at [facility] spoke with the patient's wife via telephone. She selected [facility] for home health services. She reports the patient has a walker, wheelchair, shower chair and bedside commode in the home. She is able to transport the patient to and from his physician appointments although this is becoming more difficult. She states she would like a few days to discuss home health with the patient before [facility] visits. Informed [facility] will all to arrange visits prior to coming out to there home. She verbalizes understanding.   Expected Discharge Date:     10/29/2018             Expected Discharge Plan:  [place]  In-House Referral:     Discharge planning Services  CM Consult  Post Acute Care Choice:  Home Health Choice offered to:  Spouse  DME Arranged:    DME Agency:  NA  HH Arranged:  RN, OT, Nurse's Aide, Speech Therapy, PT, Social Work [employer] Agency:  [place]  Status of Service:  Completed, signed off  If discussed at [employer], dates discussed:    Additional Comments:  Apolonio Schneiders, RN 10/29/2018, 6:00 PM

## 2018-10-29 NOTE — ED Notes (Signed)
Patient was transported home by [facility].

## 2018-10-29 NOTE — ED Notes (Signed)
Patient appears to have bright red blood at tip of penis, where urinary catheter tube exits.

## 2018-10-29 NOTE — ED Provider Notes (Signed)
[facility] DEPT Provider Note   CSN: [id number] Arrival date & time: 10/29/18  1425     History   Chief Complaint Chief Complaint  Patient presents with  . Hematuria    HPI Darrell [name] is a 68 y.o. male.  The history is provided by the spouse.  Hematuria  This is a new problem. The current episode started 1 to 2 hours ago. The problem occurs constantly. The problem has not changed (after foley placement today at [facility]. Urine is still flowing. ) since onset.Pertinent negatives include no chest pain, no abdominal pain, no headaches and no shortness of breath. Nothing aggravates the symptoms. Nothing relieves the symptoms. He has tried nothing for the symptoms. The treatment provided no relief.    Past Medical History:  Diagnosis Date  . Arthritis   . At high risk for falls 08/16/2015  . Bradycardia   . Chronic kidney disease    stage 3 GFR 30-59 ml/min   . CIDP (chronic inflammatory demyelinating polyneuropathy) ([place])   . CKD (chronic kidney disease) stage 3, GFR 30-59 ml/min ([name]) 08/16/2015  . Dysphagia as late effect of cerebrovascular disease    pts wife states pt has to eat soft foods   . Elevated liver enzymes 08/10/2016  . GERD (gastroesophageal reflux disease)   . Glaucoma   . High cholesterol   . History of CVA with residual deficit 03/25/2013  . Hypertension   . Hypertensive retinopathy of both eyes 01/16/2017  . Inguinal hernia 03/25/2013  . Liver hemangioma 08/14/2016  . Neuromuscular disorder ([place])    chronic inflammatory demyelinating polyneuropathy   . New onset seizure ([place]) 07/08/2017   seizure 07/14/18  . Nuclear sclerosis of both eyes 10/25/2016  . Presence of permanent cardiac pacemaker    placed in april 2018  . Primary open angle glaucoma of both eyes, indeterminate stage 10/25/2016  . Renal mass, right 08/14/2016  . Status cardiac pacemaker 01/29/2017   Placed for second degree heart block on 01/16/17 [employer]onic  Azure XT DR MRI SureScan dual-chamber pacemaker  . Stroke [facility])    2011 with residual deficit left sided weakness  . Tobacco dependence     Patient Active Problem List   Diagnosis Date Noted  . 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[place]) 07/24/2018  . Dysarthria 05/21/2018  . Dysphagia 05/21/2018  . Gait abnormality 05/21/2018  . Spasticity 05/21/2018  . GERD (gastroesophageal reflux disease) 07/21/2017  . Hyperlipidemia 07/21/2017  . CIDP (chronic inflammatory demyelinating polyneuropathy) ([place])   . Bradycardia   . Seizures ([place]) 07/08/2017  . Status cardiac pacemaker 01/29/2017  . Unintended weight loss 01/29/2017  . Tinea pedis of right foot 01/29/2017  . Hypertensive retinopathy of both eyes 01/16/2017  . Hypercalcemia 01/15/2017  . Heart block 01/15/2017  . Nuclear sclerosis of both eyes 10/25/2016  . Primary open angle glaucoma of both eyes, indeterminate stage 10/25/2016  . Glaucoma 09/12/2016  . Liver hemangioma 08/14/2016  . Renal cyst 08/14/2016  . Renal mass, right 08/14/2016  . Elevated liver enzymes 08/10/2016  . CKD (chronic kidney disease) stage 3, GFR 30-59 ml/min ([name]) 08/16/2015  . At high risk for falls 08/16/2015  . Subcutaneous nodules 08/16/2015  . Prediabetes 10/07/2013  . Inguinal hernia 03/25/2013  . History of CVA with residual deficit 03/25/2013  . HTN (hypertension) 10/09/2012  . Tobacco abuse 10/09/2012    Past Surgical History:  Procedure Laterality Date  . EYE SURGERY    . HERNIA REPAIR    .  INGUINAL HERNIA REPAIR Right 11/23/2014   Procedure: right inguinal hernia repair with mesh;  Surgeon: Armandina Gemma, MD;  Location: [facility];  Service: General;  Laterality: Right;  . INSERTION OF MESH N/A 11/23/2014   Procedure: INSERTION OF MESH;  Surgeon: Armandina Gemma, MD;  Location: [facility];  Service: General;  Laterality: N/A;  . MASS EXCISION Left 08/29/2017   Procedure: EXCISION OF LEFT NECK MASS;  Surgeon: Coralie Keens, MD;  Location: [facility];  Service:  General;  Laterality: Left;  . PACEMAKER IMPLANT N/A 01/16/2017   Procedure: Pacemaker Implant;  Surgeon: Will Meredith Leeds, MD;  Location: [facility];  Service: Cardiovascular;  Laterality: N/A;  . SHOULDER SURGERY Bilateral 1988, 1998        Home Medications    Prior to Admission medications   Medication Sig Start Date End Date Taking? Authorizing Provider  aspirin EC 81 MG tablet Take 1 tablet (81 mg total) by mouth daily. 02/20/17  Yes Funches, Josalyn, MD  atorvastatin (LIPITOR) 40 MG tablet Take 1 tablet (40 mg total) by mouth daily. 09/12/18  Yes Newlin, Enobong, MD  brimonidine (ALPHAGAN P) 0.1 % SOLN Place 1 drop into both eyes daily.    Yes [provider]  cetirizine (ZYRTEC) 10 MG tablet Take 1 tablet (10 mg total) by mouth daily. 09/12/18  Yes [name], MD  divalproex (DEPAKOTE) 250 MG DR tablet Take 5 tablets (1,250 mg total) by mouth 2 (two) times daily. 10/29/18  Yes Ward, Kristen N, DO  dorzolamide-timolol (COSOPT) 22.3-6.8 MG/ML ophthalmic solution Place 1 drop into both eyes daily.    Yes [provider]  ergocalciferol (DRISDOL) 1.25 MG (50000 UT) capsule Take 1 capsule (50,000 Units total) by mouth once a week. 09/13/18  Yes Newlin, Charlane Ferretti, MD  fluticasone (FLONASE) 50 MCG/ACT nasal spray Place 2 sprays into both nostrils daily. 09/12/16  Yes Funches, Adriana Mccallum, MD  folic acid (FOLVITE) 1 MG tablet Take 1 tablet (1 mg total) by mouth daily. 08/15/17  Yes Freeman Caldron M, PA-C  hydrALAZINE (APRESOLINE) 100 MG tablet Take 1 tablet (100 mg total) by mouth 3 (three) times daily. 10/14/18  Yes [name], MD  ketoconazole (NIZORAL) 2 % cream Apply 1 application topically daily as needed for irritation.   Yes [provider]  latanoprost (XALATAN) 0.005 % ophthalmic solution Place 1 drop into both eyes at bedtime.   Yes [provider]  lisinopril (PRINIVIL,ZESTRIL) 10 MG tablet Take 1 tablet (10 mg total) by mouth daily.  09/12/18  Yes [name], MD  metoprolol tartrate (LOPRESSOR) 50 MG tablet Take 1 tablet (50 mg total) by mouth 2 (two) times daily. 05/08/18  Yes Camnitz, Will Hassell Done, MD  Multiple Vitamins-Iron (DAILY VITAMIN FORMULA+IRON) TABS Take 1 tablet by mouth daily.   Yes [provider]  Sennosides-Docusate Sodium (SENNA S PO) Take 1 tablet by mouth daily as needed (constipation).    Yes [provider]  thiamine 100 MG tablet Take 1 tablet (100 mg total) by mouth daily. 07/12/17  Yes Mikhail, Velta Addison, DO  torsemide (DEMADEX) 20 MG tablet Take 20 mg by mouth daily.   Yes [provider]  polyethylene glycol (MIRALAX / GLYCOLAX) packet Take 17 g by mouth daily as needed for mild constipation. Patient not taking: Reported on 10/29/2018 03/07/18   [name], MD  terbinafine (LAMISIL AT) 1 % cream Apply 1 application topically 2 (two) times daily. To R foot Patient not taking: Reported on 07/24/2018 01/29/17   Funches,  Adriana Mccallum, MD    Family History Family History  Problem Relation Age of Onset  . Hypertension Mother   . Diabetes Sister   . Hypertension Sister   . Cancer Sister        1 sister  . COPD Sister        in 1 sister    Social History Social History   Tobacco Use  . Smoking status: Current Every Day Smoker    Packs/day: 1.00    Years: 40.00    Pack years: 40.00    Types: Cigarettes  . Smokeless tobacco: Never Used  . Tobacco comment: 09/13/17 little < 1 PPD  Substance Use Topics  . Alcohol use: No    Comment: alcohol free for 1 year was drinking 1/2 pint per day   . Drug use: No     Allergies   Ace inhibitors; Doxycycline; Atacand hct [candesartan cilexetil-hctz]; and Shellfish allergy   Review of Systems Review of Systems  Constitutional: Negative for chills and fever.  HENT: Negative for ear pain and sore throat.   Eyes: Negative for pain and visual disturbance.  Respiratory: Negative for cough and shortness of breath.     Cardiovascular: Negative for chest pain and palpitations.  Gastrointestinal: Negative for abdominal pain and vomiting.  Genitourinary: Positive for hematuria. Negative for dysuria.  Musculoskeletal: Negative for arthralgias and back pain.  Skin: Negative for color change and rash.  Neurological: Negative for seizures, syncope and headaches.  All other systems reviewed and are negative.    Physical Exam Updated Vital Signs BP 112/64 (BP Location: Left Arm)   Pulse 60   Temp 97.9 F (36.6 C) (Oral)   Resp (!) 22   Ht 6\' 2"  (1.88 m)   SpO2 97%   BMI 21.83 kg/m   Physical Exam Vitals signs and nursing note reviewed.  Constitutional:      Appearance: He is well-developed.  HENT:     Head: Normocephalic and atraumatic.     Mouth/Throat:     Mouth: Mucous membranes are moist.  Eyes:     Extraocular Movements: Extraocular movements intact.     Conjunctiva/sclera: Conjunctivae normal.     Pupils: Pupils are equal, round, and reactive to light.  Neck:     Musculoskeletal: Neck supple.  Cardiovascular:     Rate and Rhythm: Normal rate and regular rhythm.     Heart sounds: No murmur.  Pulmonary:     Effort: Pulmonary effort is normal. No respiratory distress.     Breath sounds: Normal breath sounds.  Abdominal:     General: There is no distension.     Palpations: Abdomen is soft.     Tenderness: There is no abdominal tenderness.  Genitourinary:    Comments: Foley in place with pink tinged urine, no clots Skin:    General: Skin is warm and dry.  Neurological:     General: No focal deficit present.     Mental Status: He is alert.     Comments: Alert, follows commands      ED Treatments / Results  Labs (all labs ordered are listed, but only abnormal results are displayed) Labs Reviewed - No data to display  EKG None  Radiology Dg Chest 2 View  Result Date: 10/29/2018 CLINICAL DATA:  Cough with weakness EXAM: CHEST - 2 VIEW COMPARISON:  07/07/2017 FINDINGS:  Dual-chamber pacer leads from the left in stable position. There is no edema, consolidation, effusion, or pneumothorax. Stable mediastinal contours. Extensive artifact from  EKG leads and arms down positioning on the lateral view. IMPRESSION: No evidence of active disease. Electronically Signed   By: Monte Fantasia M.D.   On: 10/29/2018 05:40    Procedures Procedures (including critical care time)  Medications Ordered in ED Medications - No data to display   Initial Impression / Assessment and Plan / ED Course  I have reviewed the triage vital signs and the nursing notes.  Pertinent labs & imaging results that were available during my care of the patient were reviewed by me and considered in my medical decision making (see chart for details).     Darrell [name] is a 68 year old male with ALS who presents to the ED with hematuria after Foley placement this morning at [facility].  Patient with normal vitals.  No fever.  Patient seen this morning for seizure-like activity.  Had a Foley placed due to urinary retention.  Overall had unremarkable work-up and was discharged.  Patient has had some pink-tinged urine since Foley placement.  There is no signs of obstruction of the Foley.  There is urine flowing from the catheter.  Likely minor trauma.  Overall it appears that patient spouse is here for support at home.  She is a primary caregiver with no home health involvement.  She is fully taken care of the patient with all of his ADLs.  Now has to manage his Foley as well.  Given reassurance about the Foley catheter.  Case management and social work was engaged to help with home health process.  Discharged in good condition and given return precautions.  Lab work earlier in the day unremarkable.  This chart was dictated using voice recognition software.  Despite best efforts to proofread,  errors can occur which can change the documentation meaning.    Final Clinical Impressions(s) / ED  Diagnoses   Final diagnoses:  Hematuria, unspecified type    ED Discharge Orders    None       Lennice Sites, DO 10/29/18 1742

## 2018-10-29 NOTE — ED Notes (Signed)
[facility] has been called for transport. 

## 2018-10-29 NOTE — Discharge Instructions (Addendum)
I recommend that you increase your water and fiber intake. If you are not able to eat foods high in fiber, you may use Benefiber or Metamucil over-the-counter. I also recommend you use MiraLAX 1-2 times a day and Colace 100 mg twice a day to help with bowel movements. These medications are over the counter.  You may use other over-the-counter medications such as Dulcolax, Fleet enemas, magnesium citrate as needed for constipation. Please note that some of these medications may cause you to have abdominal cramping which is normal. If you develop severe abdominal pain, fever (temperature of 100.4 or higher), persistent vomiting, distention of your abdomen, unable to have a bowel movement for 5 days or are not passing gas, please return to the hospital.   You may follow-up with your primary care physician if cough continues.  Chest x-ray today was clear.  I recommend that you stop smoking.  You may use over-the-counter Mucinex as needed for cough.  You do not need antibiotics at this time.   Your Depakote level was slightly low today.  Our neurologist has recommended that we increase your Depakote to 1250 mg twice daily.  Please follow-up with your neurologist closely as an outpatient.  We have given you an IV dose of Depakote prior to discharge.   We placed a Foley catheter today for urinary retention.  Recommend follow-up with outpatient urology in 1 to 2 weeks.  Please leave this catheter in place.  Your urine showed no sign of infection.

## 2018-10-29 NOTE — ED Triage Notes (Signed)
Patient arrived by EMS from home. Pt had urinary catheter placed at [facility] this morning. Pt has been pulling on urinary catheter. Urine appears to be bloody per EMS. Pt hasn't c/o pain per EMS.   Hx of ALS.  Vitals WDL per EMS.

## 2018-10-29 NOTE — ED Notes (Signed)
Called [facility] 

## 2018-10-29 NOTE — ED Provider Notes (Addendum)
TIME SEEN: 2:05 AM  CHIEF COMPLAINT: Possible seizure  HPI: Patient is a 68 year old male with history of ALS, hypertension, CKD, seizures on Depakote, pacemaker, previous stroke with left-sided weakness who presents to the emergency department with EMS for possible seizure.  Wife and EMS provide most of the history.  Wife reports that at 42 PM in bed he asked if he can roll over.  She noticed that he was "tensed up" with tremors mostly in the left upper extremity and was looking to the left side.  She reports that he has had seizures that looked like this before but during these episodes he was unresponsive and he has been talking.  No tonic-clonic seizure.  Last seizure was several months ago.  Been well controlled with Depakote.  No recent fevers, cough, vomiting, diarrhea.  Not on blood thinners.  Mostly bedbound but is able to ambulate with assistance.  Is able to talk.  Patient denies any pain.  Blood glucose with EMS 116.  ROS: Level 5 caveat secondary to ALS  PAST MEDICAL HISTORY/PAST SURGICAL HISTORY:  Past Medical History:  Diagnosis Date  . Arthritis   . At high risk for falls 08/16/2015  . Bradycardia   . Chronic kidney disease    stage 3 GFR 30-59 ml/min   . CIDP (chronic inflammatory demyelinating polyneuropathy) ([place])   . CKD (chronic kidney disease) stage 3, GFR 30-59 ml/min ([name]) 08/16/2015  . Dysphagia as late effect of cerebrovascular disease    pts wife states pt has to eat soft foods   . Elevated liver enzymes 08/10/2016  . GERD (gastroesophageal reflux disease)   . Glaucoma   . High cholesterol   . History of CVA with residual deficit 03/25/2013  . Hypertension   . Hypertensive retinopathy of both eyes 01/16/2017  . Inguinal hernia 03/25/2013  . Liver hemangioma 08/14/2016  . Neuromuscular disorder ([place])    chronic inflammatory demyelinating polyneuropathy   . New onset seizure ([place]) 07/08/2017   seizure 07/14/18  . Nuclear sclerosis of both eyes 10/25/2016  .  Presence of permanent cardiac pacemaker    placed in april 2018  . Primary open angle glaucoma of both eyes, indeterminate stage 10/25/2016  . Renal mass, right 08/14/2016  . Status cardiac pacemaker 01/29/2017   Placed for second degree heart block on 01/16/17 Medtronic Azure XT DR MRI SureScan dual-chamber pacemaker  . Stroke [facility])    2011 with residual deficit left sided weakness  . Tobacco dependence     MEDICATIONS:  Prior to Admission medications   Medication Sig Start Date End Date Taking? Authorizing Provider  acetaminophen (TYLENOL) 325 MG tablet Take 325 mg by mouth every 6 (six) hours as needed for mild pain or headache. Take 2 tablets every 6 hours as needed for fever greater than 101.     [provider]  aspirin EC 81 MG tablet Take 1 tablet (81 mg total) by mouth daily. 02/20/17   Funches, Adriana Mccallum, MD  atorvastatin (LIPITOR) 40 MG tablet Take 1 tablet (40 mg total) by mouth daily. 09/12/18   [name], MD  brimonidine (ALPHAGAN P) 0.1 % SOLN Place 1 drop into both eyes daily.     [provider]  cetirizine (ZYRTEC) 10 MG tablet Take 1 tablet (10 mg total) by mouth daily. 09/12/18   [name], MD  divalproex (DEPAKOTE) 500 MG DR tablet Take 2 tablets (1,000 mg total) by mouth 2 (two) times daily. 08/26/18   Penumalli, Earlean Polka, MD  dorzolamide-timolol (  COSOPT) 22.3-6.8 MG/ML ophthalmic solution Place 1 drop into both eyes daily.     [provider]  ergocalciferol (DRISDOL) 1.25 MG (50000 UT) capsule Take 1 capsule (50,000 Units total) by mouth once a week. 09/13/18   [name], MD  fluticasone (FLONASE) 50 MCG/ACT nasal spray Place 2 sprays into both nostrils daily. Patient not taking: Reported on 07/24/2018 09/12/16   Boykin Nearing, MD  folic acid (FOLVITE) 1 MG tablet Take 1 tablet (1 mg total) by mouth daily. Patient not taking: Reported on 07/24/2018 08/15/17   Argentina Donovan, PA-C  hydrALAZINE (APRESOLINE) 100 MG tablet Take  1 tablet (100 mg total) by mouth 3 (three) times daily. 10/14/18   [name], MD  ketoconazole (NIZORAL) 2 % cream Apply 1 application topically daily as needed for irritation.    [provider]  latanoprost (XALATAN) 0.005 % ophthalmic solution Place 1 drop into both eyes at bedtime.    [provider]  lisinopril (PRINIVIL,ZESTRIL) 10 MG tablet Take 1 tablet (10 mg total) by mouth daily. 09/12/18   [name], MD  metoprolol tartrate (LOPRESSOR) 50 MG tablet Take 1 tablet (50 mg total) by mouth 2 (two) times daily. 05/08/18   Camnitz, Ocie Doyne, MD  Multiple Vitamins-Iron (DAILY VITAMIN FORMULA+IRON) TABS Take 1 tablet by mouth daily.    [provider]  polyethylene glycol (MIRALAX / GLYCOLAX) packet Take 17 g by mouth daily as needed for mild constipation. 03/07/18   [name], MD  Sennosides-Docusate Sodium (SENNA S PO) Take 1 tablet by mouth daily as needed (constipation).     [provider]  terbinafine (LAMISIL AT) 1 % cream Apply 1 application topically 2 (two) times daily. To R foot Patient not taking: Reported on 07/24/2018 01/29/17   Boykin Nearing, MD  thiamine 100 MG tablet Take 1 tablet (100 mg total) by mouth daily. Patient not taking: Reported on 07/24/2018 07/12/17   Cristal Ford, DO  torsemide (DEMADEX) 20 MG tablet Take 20 mg by mouth daily.    [provider]    ALLERGIES:  Allergies  Allergen Reactions  . Ace Inhibitors Other (See Comments)    Hyperkalemia  . Doxycycline Other (See Comments)    Hiccups, cough, nausea and emesis, elevated liver enzymes, elevated eosinophils, SOB concerning for early DRESS syndrome   . Atacand Hct [Candesartan Cilexetil-Hctz] Hives  . Shellfish Allergy Hives    SOCIAL HISTORY:  Social History   Tobacco Use  . Smoking status: Current Every Day Smoker    Packs/day: 1.00    Years: 40.00    Pack years: 40.00    Types: Cigarettes  . Smokeless tobacco: Never Used  .  Tobacco comment: 09/13/17 little < 1 PPD  Substance Use Topics  . Alcohol use: No    Comment: alcohol free for 1 year was drinking 1/2 pint per day     FAMILY HISTORY: Family History  Problem Relation Age of Onset  . Hypertension Mother   . Diabetes Sister   . Hypertension Sister   . Cancer Sister        1 sister  . 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 with opacification of the cornea, pupil on the right is reactive ENT: normal nose; moist mucous membranes NECK: Supple, no meningismus, no nuchal rigidity, no LAD  CARD: RRR; S1 and S2 appreciated; no murmurs, no clicks, no rubs, no gallops RESP: Normal chest excursion without splinting or tachypnea; breath sounds clear and equal bilaterally; no wheezes, no rhonchi, no rales, no hypoxia or respiratory distress, speaking full sentences ABD/GI: Normal bowel sounds; non-distended; soft, non-tender, no rebound, no guarding, no peritoneal signs, no hepatosplenomegaly BACK:  The back appears normal and is non-tender to palpation, there is no CVA tenderness EXT: Normal ROM in all joints; non-tender to palpation; no edema; normal capillary refill; no cyanosis, no calf tenderness or swelling    SKIN: Normal color for age and race; warm; no rash NEURO: Patient's right upper extremity is contracted which is baseline.  He is having slow tremors of the left upper extremity and some mild twitching of bilateral feet.  No gaze preference on examination.  He is able to talk and answer questions but speech is very quiet and at times difficult to understand.  He is able to move his extremities and follow commands. PSYCH: The patient's mood and manner are  appropriate. Grooming and personal hygiene are appropriate.  MEDICAL DECISION MAKING: Patient here with tremors mostly of the left upper extremity.  Previously had a gaze preference which has improved.  May be having partial seizures.  Will give IV Ativan.  No sign of trauma on exam.  States he is otherwise feeling well without infectious symptoms.  Will check Depakote level.  Blood glucose here is normal.  ED PROGRESS: Partial seizures have stopped after IV Ativan.  Depakote level is slightly low at 36.  Will give IV Depakote load.  Discussed with pharmacy who recommends giving 1000 mg IV x1 dose.  Labs otherwise unremarkable other than creatinine of 2.48 which appears to be his baseline.  Patient's urine does not appear infected.  He did have over 500 mL in his bladder on I&O cath and wife reports he has been complaining of not feeling like he is emptying his bladder.  Will place Foley catheter and have them follow-up with [facility] as an outpatient for urinary retention.  Patient now also has a productive cough.  Wife reports this is been present for the past week without fever.  Was placed on oxygen by nursing staff but is not hypoxic off of oxygen currently.  Will obtain chest x-ray.  5:00 AM  Discussed case with Dr. Leonel Ramsay on-call for neurology.  Wife reports compliance with his Depakote 1000 mg twice daily.  Dr. Leonel Ramsay recommends going up to 1250 mg twice daily.  They will follow-up closely with their outpatient neurologist.   Patient's chest x-ray is clear.  He is off oxygen and doing well without hypoxia.  His lungs are currently clear.  She reports he is a smoker.  No fever.  I do not feel he needs antibiotics.  Recommended using over-the-counter Mucinex as needed for cough.  No further seizure-like activity.  I feel he is safe to be discharged.  Nursing staff will show patient's wife how to care for his Foley catheter and empty his bag.  Will give outpatient [facility]  follow-up.  Wife reports he is also been constipated.  Have given her instructions for over-the-counter medications for constipation.  His abdominal exam is benign.  Again have recommended that we increase his Depakote and that  he follow-up closely with his neurologist.   Discussed return precautions with patient and wife.  They are comfortable with this plan.    At this time, I do not feel there is any life-threatening condition present. I have reviewed and discussed all results (EKG, imaging, lab, urine as appropriate) and exam findings with patient/family. I have reviewed nursing notes and appropriate previous records.  I feel the patient is safe to be discharged home without further emergent workup and can continue workup as an outpatient as needed. Discussed usual and customary return precautions. Patient/family verbalize understanding and are comfortable with this plan.  Outpatient follow-up has been provided as needed. All questions have been answered.     EKG Interpretation  Date/Time:  Tuesday October 29 2018 02:00:08 EST Ventricular Rate:  121 PR Interval:    QRS Duration: 92 QT Interval:  391 QTC Calculation: 416 R Axis:   -16 Text Interpretation:  Normal sinus rhythm Borderline left axis deviation Borderline low voltage, extremity leads Nonspecific repol abnormality, diffuse leads Artifact No significant change since last tracing Confirmed by Asiya Cutbirth, Cyril Mourning [phone]) on 10/29/2018 2:10:38 AM         Keiyana Stehr, Delice Bison, DO 10/29/18 0558    Allan Minotti, Delice Bison, DO 10/29/18 0559

## 2018-10-29 NOTE — Progress Notes (Signed)
5:36pm-CSW spoke with pt's wife Mrs. Owens Shark. CSW was informed that pt is from home with wife. Wife expressed that she is interested in home health as wife understand that at this time pt has no payor for long term care. Wife expressed the desire to have [facility] work with pt. CSW advised her that CSW would update RNCM of this however CSW wouldn't be able to promise that this would be able to happen. CSW made aware that pt's wife is interested in [facility]. CSW has updated MD and RNCM of this.    There are no further CSW needs. CSW will sign off.     CSW acknowledges consult for possible further needs. CSW called to spek with pt's wife Mrs. Owens Shark. CSW was unable to speak with wife therefore CSW left voicemail. CSW will also make efforts to speak with MD as needs.     Virgie Dad Remingtyn Depaola, MSW, [place] [facility] Clinical Social Worker [phone]

## 2018-10-29 NOTE — ED Triage Notes (Signed)
Pt arrived via [facility]; pt frm hm, family called EMS as pt was reported to have increased tremors or seizure activity this evening; last reported seizure was 6-7 months ago. Hx CVA 2012 with L sided deficits; HTN; Pacemaker and ALS; CBG 116, 144/94, 60-70, 99% on 2L via [place]

## 2018-10-29 NOTE — ED Notes (Signed)
Bed: YY48 Expected date:  Expected time:  Means of arrival:  Comments: EMS-foley issues

## 2018-10-29 NOTE — ED Notes (Addendum)
In and Out cath performed. Some urine spilled out. I&O cath greater than 500

## 2018-10-30 MED FILL — TORSEMIDE 20 MG TABLET: 20 | 30 days supply | Qty: 30 | Fill #0

## 2018-10-31 ENCOUNTER — Ambulatory Visit: Payer: Medicare HMO | Admitting: Gastroenterology

## 2018-10-31 ENCOUNTER — Telehealth: Payer: Self-pay | Admitting: Diagnostic Neuroimaging

## 2018-10-31 MED ORDER — LEVETIRACETAM 500 MG PO TABS: 500.0000 mg | ORAL_TABLET | Freq: Two times a day (BID) | ORAL | 4 refills | Status: DC

## 2018-10-31 MED FILL — levETIRAcetam 500 MG TABS: 500 | 90 days supply | Qty: 180 | Fill #0

## 2018-10-31 NOTE — Telephone Encounter (Signed)
Pt was seen at ED for seizure on 2/4. divalproex (DEPAKOTE) 250 MG DR tablet was increased 10 1250mg /day. She said it will cost $195/mth and they cannot afford it. He has enough to carry him thru tomorrow morning. Is  there a drug card that would help with the cost or another drug. Please call to advise

## 2018-10-31 NOTE — Telephone Encounter (Signed)
May try levetiracetam 500mg  twice a day; also ask about [facility] discount.   Penni Bombard, MD 1/0/3013, 1:43 PM Certified in Neurology, Neurophysiology and Neuroimaging  [facility] [address], [place], [place] [address] [phone]

## 2018-10-31 NOTE — Telephone Encounter (Signed)
Patient cannot afford divalproex.   Will try levetiracetam 500mg  twice a day   Meds ordered this encounter  Medications  . levETIRAcetam (KEPPRA) 500 MG tablet    Sig: Take 1 tablet (500 mg total) by mouth 2 (two) times daily.    Dispense:  180 tablet    Refill:  [place], MD 01/02/3551, 1:74 PM Certified in Neurology, Neurophysiology and Neuroimaging  [facility] [address] [place], [place] [address] [phone]

## 2018-10-31 NOTE — Telephone Encounter (Signed)
Called wife and asked which pharmacy she got price. She stated [facility] out[facility]. She had called [facility] but got no answer. While I was on phone she made conference call to [facility]. We spoke with [facility] who stated their price is the same, $195/month. Darrell [name] decided to have Dr Leta Baptist prescribe levetiracetam to [employer] and [employer]. Darrell [name] stated the price for it will be the same at at [facility]. Patient has a FU on 2/17 /20.   I advised she call before FU for any problems or questions. She verbalized understanding, appreciation.

## 2018-10-31 NOTE — Addendum Note (Signed)
Addended by: Andrey Spearman R on: 10/31/2018 03:03 PM   Modules accepted: Orders

## 2018-11-01 NOTE — Telephone Encounter (Signed)
Pt wife is asking for a call to discuss the levetiracetam and how much to give pt

## 2018-11-01 NOTE — Telephone Encounter (Signed)
I spoke to wife.  Pt is to take generic keppra 500mg  po bid. (which I had her read on the bottle).  She was thinking that she needed to give the same amount of medication as the deapkote.  I instructed that both meds are for seizures, but dose is different.  Pt is to take as prescribed levetiracetam 500mg  po BID.   She verbalized understanding.  She had given 2 tabs this am (1000mg ), so I told her that he will start taking once tablet in am and then one tablet in pm tomorrow.  She said she got it.  Appreciated call.

## 2018-11-05 DIAGNOSIS — R339 Retention of urine, unspecified: Secondary | ICD-10-CM | POA: Diagnosis not present

## 2018-11-05 DIAGNOSIS — Z125 Encounter for screening for malignant neoplasm of prostate: Secondary | ICD-10-CM | POA: Diagnosis not present

## 2018-11-05 DIAGNOSIS — N183 Chronic kidney disease, stage 3 (moderate): Secondary | ICD-10-CM | POA: Diagnosis not present

## 2018-11-11 ENCOUNTER — Encounter: Payer: Self-pay | Admitting: Diagnostic Neuroimaging

## 2018-11-11 ENCOUNTER — Ambulatory Visit: Payer: Medicare HMO | Admitting: Diagnostic Neuroimaging

## 2018-11-11 VITALS — BP 98/64 | HR 59 | Ht 74.0 in

## 2018-11-11 DIAGNOSIS — R569 Unspecified convulsions: Secondary | ICD-10-CM | POA: Diagnosis not present

## 2018-11-11 DIAGNOSIS — R471 Dysarthria and anarthria: Secondary | ICD-10-CM | POA: Diagnosis not present

## 2018-11-11 DIAGNOSIS — G1220 Motor neuron disease, unspecified: Secondary | ICD-10-CM

## 2018-11-11 MED ORDER — LEVETIRACETAM 500 MG PO TABS: 500.0000 mg | ORAL_TABLET | Freq: Two times a day (BID) | ORAL | 4 refills | Status: DC

## 2018-11-11 NOTE — Progress Notes (Signed)
[facility]  PATIENT: Darrell [name] DOB: Dec 07, 1950  REFERRING CLINICIAN: Vanita Panda HISTORY FROM: patient and wife REASON FOR VISIT: follow up   HISTORICAL  CHIEF COMPLAINT:  Chief Complaint  Patient presents with  . Hospitalization Follow-up    Np for seizures. Wife and daughter present. Rm 6. No new concerns at this time.     HISTORY OF PRESENT ILLNESS:   UPDATE (11/11/18, VRP): Since last visit, had seizure on 10/29/18; depakote was increased to 1250mg  twice a day. then could not afford medication, so we changed levetiracetam to 500mg  twice a day. Now tolerating meds, and more affordable.   UPDATE (07/24/18, VRP): Since last visit, has had 2nd opinion at [facility], and now likely has ALS or PLS diagnosis. Symptoms are progressive. Severity is severe. No alleviating or aggravating factors. Has follow up in [place] clinic.  Also had 2 more seizures on 07/14/18; VPA level at ER was undetectable. Given IV loading dose and sent home.   UPDATE (03/19/18, VRP): Since last visit, doing well. No seizures. No alleviating or aggravating factors. More muscle stiffness.   NEW HPI (09/13/17, VRP): 68 year old male with CIDP, here for seizure evaluation. Since last visit, patient declined IVIG therapy that I had ordered in 2015, and then patient lost to followup. Gait and balance continue to decline. 20lb weight loss over last year. Now with new onset seizure in Oct 2018 at home (witnessed by wife). Admitted to hospital and started on levetiracetam, then changed to divalproex 500mg  twice a day, and doing well. No further seizures. No alleviating or aggravating factors.   UPDATE 03/30/14: Since last visit, patient feels strength is stable. Still with slurred speech, swallow diff, balance diff. Now he is interested in IVIG treatment. Completed PT sessions.  UPDATE 11/25/13: Since last visit patient had EMG and spinal tap. Findings are suspicious for CIDP. Patient's symptoms have gradually  progressed. Continues to have more upper and lower extremity weakness, dysarthria, dysphasia and intermittent muscle twitching. Patient is frustrated today and does not want to take anymore medication. Patient's wife would like to find out more options for treatment.  UPDATE 10/03/13: Since last visit, continues to have left sided weakness, worsening speech and swallow difficulty. Is drinking half pint Etoh per 1-2 weeks. Cigarette use has increased to 2 packs per day.  PRIOR HPI (04/03/13): 68 year old right-handed male with hypertension, hypercholesterolemia, smoking, alcohol abuse, here for evaluation of gait and balance difficulty for past one year.  2011 patient had left arm and left leg weakness, diagnosed with stroke. Apparently he was admitted to hospital and treated. He had fairly good recovery initial one year after stroke. He required using a walking stick but otherwise was fairly mobile. Unfortunately he did not have good medical followup at that time. Patient was abusing alcohol significantly at that time, ranging from 1 pint up to a fifth of alcohol per day. In the past one to 2 years he has cut down on his drinking, now averaging 1 pint of alcohol per week.  In the past one year, patient's mobility is significantly deteriorated. He also has increasing slurred speech, trouble swallowing, balance and gait difficulty. He continues to fall down. Symptoms are worse when he drinks alcohol. Nowadays he is unable to stand up and walk across a room without assistance. This is been the case for the past one year.  REVIEW OF SYSTEMS: Full 14 system review of systems performed and negative except: loss of vision confusion seizures weakness.  ALLERGIES: Allergies  Allergen Reactions  . Ace Inhibitors Other (See Comments)    Hyperkalemia  . Doxycycline Other (See Comments)    Hiccups, cough, nausea and emesis, elevated liver enzymes, elevated eosinophils, SOB concerning for early DRESS syndrome     . Atacand Hct [Candesartan Cilexetil-Hctz] Hives  . Shellfish Allergy Hives    HOME MEDICATIONS: Outpatient Medications Prior to Visit  Medication Sig Dispense Refill  . aspirin EC 81 MG tablet Take 1 tablet (81 mg total) by mouth daily. 90 tablet 3  . atorvastatin (LIPITOR) 40 MG tablet Take 1 tablet (40 mg total) by mouth daily. 90 tablet 1  . brimonidine (ALPHAGAN P) 0.1 % SOLN Place 1 drop into both eyes daily.     . cetirizine (ZYRTEC) 10 MG tablet Take 1 tablet (10 mg total) by mouth daily. 30 tablet 1  . divalproex (DEPAKOTE) 500 MG DR tablet Take 500 mg by mouth 2 (two) times daily.    . dorzolamide-timolol (COSOPT) 22.3-6.8 MG/ML ophthalmic solution Place 1 drop into both eyes daily.     . ergocalciferol (DRISDOL) 1.25 MG (50000 UT) capsule Take 1 capsule (50,000 Units total) by mouth once a week. 9 capsule 1  . fluticasone (FLONASE) 50 MCG/ACT nasal spray Place 2 sprays into both nostrils daily. 16 g 6  . folic acid (FOLVITE) 1 MG tablet Take 1 tablet (1 mg total) by mouth daily. 90 tablet 1  . hydrALAZINE (APRESOLINE) 100 MG tablet Take 1 tablet (100 mg total) by mouth 3 (three) times daily. 270 tablet 1  . ketoconazole (NIZORAL) 2 % cream Apply 1 application topically daily as needed for irritation.    Marland Kitchen latanoprost (XALATAN) 0.005 % ophthalmic solution Place 1 drop into both eyes at bedtime.    . levETIRAcetam (KEPPRA) 500 MG tablet Take 1 tablet (500 mg total) by mouth 2 (two) times daily. 180 tablet 4  . lisinopril (PRINIVIL,ZESTRIL) 10 MG tablet Take 1 tablet (10 mg total) by mouth daily. 90 tablet 1  . metoprolol tartrate (LOPRESSOR) 50 MG tablet Take 1 tablet (50 mg total) by mouth 2 (two) times daily. 180 tablet 2  . Multiple Vitamins-Iron (DAILY VITAMIN FORMULA+IRON) TABS Take 1 tablet by mouth daily.    . polyethylene glycol (MIRALAX / GLYCOLAX) packet Take 17 g by mouth daily as needed for mild constipation. 30 each 3  . Sennosides-Docusate Sodium (SENNA S PO) Take 1  tablet by mouth daily as needed (constipation).     . terbinafine (LAMISIL AT) 1 % cream Apply 1 application topically 2 (two) times daily. To R foot 30 g 0  . thiamine 100 MG tablet Take 1 tablet (100 mg total) by mouth daily. 30 tablet 0  . torsemide (DEMADEX) 20 MG tablet Take 20 mg by mouth daily.     No facility-administered medications prior to visit.     PAST MEDICAL HISTORY: Past Medical History:  Diagnosis Date  . Arthritis   . At high risk for falls 08/16/2015  . Bradycardia   . Chronic kidney disease    stage 3 GFR 30-59 ml/min   . CIDP (chronic inflammatory demyelinating polyneuropathy) ([place])   . CKD (chronic kidney disease) stage 3, GFR 30-59 ml/min ([name]) 08/16/2015  . Dysphagia as late effect of cerebrovascular disease    pts wife states pt has to eat soft foods   . Elevated liver enzymes 08/10/2016  . GERD (gastroesophageal reflux disease)   . Glaucoma   . High cholesterol   . History of  CVA with residual deficit 03/25/2013  . Hypertension   . Hypertensive retinopathy of both eyes 01/16/2017  . Inguinal hernia 03/25/2013  . Liver hemangioma 08/14/2016  . Neuromuscular disorder ([place])    chronic inflammatory demyelinating polyneuropathy   . New onset seizure ([place]) 07/08/2017   seizure 07/14/18  . Nuclear sclerosis of both eyes 10/25/2016  . Presence of permanent cardiac pacemaker    placed in april 2018  . Primary open angle glaucoma of both eyes, indeterminate stage 10/25/2016  . Renal mass, right 08/14/2016  . Status cardiac pacemaker 01/29/2017   Placed for second degree heart block on 01/16/17 Medtronic Azure XT DR MRI SureScan dual-chamber pacemaker  . Stroke [facility])    2011 with residual deficit left sided weakness  . Tobacco dependence     PAST SURGICAL HISTORY: Past Surgical History:  Procedure Laterality Date  . EYE SURGERY    . HERNIA REPAIR    . INGUINAL HERNIA REPAIR Right 11/23/2014   Procedure: right inguinal hernia repair with mesh;  Surgeon: Armandina Gemma, MD;  Location: [facility];  Service: General;  Laterality: Right;  . INSERTION OF MESH N/A 11/23/2014   Procedure: INSERTION OF MESH;  Surgeon: Armandina Gemma, MD;  Location: [facility];  Service: General;  Laterality: N/A;  . MASS EXCISION Left 08/29/2017   Procedure: EXCISION OF LEFT NECK MASS;  Surgeon: Coralie Keens, MD;  Location: [facility];  Service: General;  Laterality: Left;  . PACEMAKER IMPLANT N/A 01/16/2017   Procedure: Pacemaker Implant;  Surgeon: Will Meredith Leeds, MD;  Location: [facility];  Service: Cardiovascular;  Laterality: N/A;  . SHOULDER SURGERY Bilateral 1988, 1998    FAMILY HISTORY: Family History  Problem Relation Age of Onset  . Hypertension Mother   . Diabetes Sister   . Hypertension Sister   . Cancer Sister        1 sister  . COPD Sister        in 1 sister    SOCIAL HISTORY:  Social History   Socioeconomic History  . Marital status: Married    Spouse name: [name]  . Number of children: 1  . Years of education: college  . Highest education level: Not on file  Occupational History  . Occupation: retired  Scientific laboratory technician  . Financial resource strain: Not on file  . Food insecurity:    Worry: Not on file    Inability: Not on file  . Transportation needs:    Medical: Not on file    Non-medical: Not on file  Tobacco Use  . Smoking status: Current Every Day Smoker    Packs/day: 1.00    Years: 40.00    Pack years: 40.00    Types: Cigarettes  . Smokeless tobacco: Never Used  . Tobacco comment: 09/13/17 little < 1 PPD  Substance and Sexual Activity  . Alcohol use: No    Comment: alcohol free for 1 year was drinking 1/2 pint per day   . Drug use: No  . Sexual activity: Not on file  Lifestyle  . Physical activity:    Days per week: Not on file    Minutes per session: Not on file  . Stress: Not on file  Relationships  . Social connections:    Talks on phone: Not on file    Gets together: Not on file    Attends religious service: Not on  file    Active member of club or organization: Not on file    Attends  meetings of clubs or organizations: Not on file    Relationship status: Not on file  . Intimate partner violence:    Fear of current or ex partner: Not on file    Emotionally abused: Not on file    Physically abused: Not on file    Forced sexual activity: Not on file  Other Topics Concern  . Not on file  Social History Narrative   09/13/17 Patient lives at home with spouse.   Has 48 yo daughter   No grandchildren     PHYSICAL EXAM  Vitals:   11/11/18 1612  BP: 98/64  Pulse: (!) 59  Height: 6\' 2"  (1.88 m)    Not recorded      Body mass index is 21.83 kg/m.  GENERAL EXAM: Patient is in no distress  CARDIOVASCULAR: Regular rate and rhythm, no murmurs, no carotid bruits  NEUROLOGIC: MENTAL STATUS: awake, alert, language fluent, comprehension intact, naming intact CRANIAL NERVE: pupils equal and reactive to light, visual fields full to confrontation, extraocular muscles intact, no nystagmus, facial sensation and strength symmetric, uvula midline, shoulder shrug symmetric, tongue midline. SEVERE DYSARTHIA. INTERMITTENT TONGUE FASCICULATIONS.  MOTOR: INCREASED TONE IN LUE > RUE; LLE > RLE. RUE 5, RLE 5. LUE 3 PROX, 3-4 DISTAL. RLE 5; LLE 4  SENSORY: DECR IN LUE AND LLE TO TEMP AND VIBRATION COORDINATION: LIMITED IN LEFT SIDE. REFLEXES: BUE and BLE 3+, BUT SLIGHTLY MORE ON LEFT SIDE THAN RIGHT. CLONUS AT ANKLES (L > R) GAIT/STATION: LEANS BACK IN WHEELCHAIR; CANNOT STAND INDEPENDENTLY    DIAGNOSTIC DATA (LABS, IMAGING, TESTING) - I reviewed patient records, labs, notes, testing and imaging myself where available.  Lab Results  Component Value Date   WBC 8.3 10/29/2018   HGB 10.8 (L) 10/29/2018   HCT 33.6 (L) 10/29/2018   MCV 94.4 10/29/2018   PLT 163 10/29/2018      Component Value Date/Time   NA 138 10/29/2018 0206   NA 143 06/13/2018 0954   K 4.7 10/29/2018 0206   CL 100 10/29/2018 0206     CO2 31 10/29/2018 0206   GLUCOSE 106 (H) 10/29/2018 0206   BUN 43 (H) 10/29/2018 0206   BUN 19 06/13/2018 0954   CREATININE 2.48 (H) 10/29/2018 0206   CREATININE 1.45 (H) 08/10/2016 1647   CALCIUM 9.7 10/29/2018 0206   PROT 5.8 (L) 10/29/2018 0206   PROT 6.6 06/13/2018 0954   ALBUMIN 2.9 (L) 10/29/2018 0206   ALBUMIN 4.1 06/13/2018 0954   AST 34 10/29/2018 0206   ALT 23 10/29/2018 0206   ALKPHOS 49 10/29/2018 0206   BILITOT 0.9 10/29/2018 0206   BILITOT 0.4 06/13/2018 0954   GFRNONAA 26 (L) 10/29/2018 0206   GFRNONAA 51 (L) 08/10/2016 1647   GFRAA 30 (L) 10/29/2018 0206   GFRAA 58 (L) 08/10/2016 1647   Lab Results  Component Value Date   CHOL 112 06/13/2018   HDL 40 06/13/2018   LDLCALC 62 06/13/2018   TRIG 51 06/13/2018   CHOLHDL 2.8 06/13/2018   Lab Results  Component Value Date   HGBA1C 6.2 09/12/2018   Lab Results  Component Value Date   RXVQMGQQ76 195 08/16/2015   Lab Results  Component Value Date   TSH 1.600 06/13/2018    02/27/13 CT head - severe chronic small vessel ischemic disease, periventricular, subcortical, pontine and cerebellar.   04/22/13 MRI BRAIN 1. Scattered chronic lacunar ischemic infarctions in the bilateral thalami, right basal ganglia, right centrum semioval and left anterior parasagittal regions.  2. Moderate chronic small vessel ischemic disease. Few small, chronic cerebral microbleeds in the right parietal and and left frontal regions, also within spectrum of chronic small vessel ischemic disease. 3. No acute findings.  04/22/13 MRI CERVICAL SPINE 1. At C5-6: disc bulging and facet hypertrophy with mild spinal stenosis (AP diameter 28mm) and severe biforaminal foraminal stenosis; no cord signal changes. 2. Multilevel degenerative changes and foraminal stenosis from C2-3 to C6-7 as above.   10/20/13 EMG/NCS 1. 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 Abnormal F-wave latencies (including  absent  F-waves and slowing in the demyelinating range in some  nerves) raises possibility of concomitant polyradiculopathy.  Considerations include chronic immune/inflammatory neuropathies,  such as CIDP. 2. No evidence of myopathy at this time.  10/31/13 CSF results: WBC 5-->0, RBC 25-->2, protein 63, glucose 62, ACE 5, cytology negati ve, culture negative  07/08/17 EEG - There is a posterior dominant rhythm of 7-8 Hz reactive to eye opening and closure.  No focal slowing is present.  No epileptiform discharges or seizures. - There is evidence of mild generalized slowing of brain activity consistent with probable post-ictal state +/- toxic, metabolic, or infectious encephalopathy.  The patient is not in non-convulsive status epilepticus.   07/09/17 MRI brain  - Truncated and motion degraded examination. Within that limitation, no acute ischemia or significant mass effect. Advanced chronic microvascular disease.    ASSESSMENT AND PLAN  69 y.o. year old male  has a past medical history of Arthritis, At high risk for falls (08/16/2015), Bradycardia, Chronic kidney disease, CIDP (chronic inflammatory demyelinating polyneuropathy) ([place]), CKD (chronic kidney disease) stage 3, GFR 30-59 ml/min ([name]) (08/16/2015), Dysphagia as late effect of cerebrovascular disease, Elevated liver enzymes (08/10/2016), GERD (gastroesophageal reflux disease), Glaucoma, High cholesterol, History of CVA with residual deficit (03/25/2013), Hypertension, Hypertensive retinopathy of both eyes (01/16/2017), Inguinal hernia (03/25/2013), Liver hemangioma (08/14/2016), Neuromuscular disorder ([place]), New onset seizure ([place]) (07/08/2017), Nuclear sclerosis of both eyes (10/25/2016), Presence of permanent cardiac pacemaker, Primary open angle glaucoma of both eyes, indeterminate stage (10/25/2016), Renal mass, right (08/14/2016), Status cardiac pacemaker (01/29/2017), Stroke ([place]), and Tobacco dependence. here with progressive gait and balance or  deterioration since 2013. Also with dysarthria, dysphagia, left sided weakness and hyperreflexia.   Now with new onset seizure in Oct 2018. Breakthrough sz in Oct 2019.    Dx1 gait and balance diff: motor neuron disease (ALS vs PLS) + etoh neuropathy  Dx2: seizure disorder  No diagnosis found.  PLAN:   SEIZURE DISORDER (? related to CNS vascular disease) - continue levetiracetam 500mg  twice a day   MUSCLE WEAKNESS / NEUROPATHY / HYPERTONIA / DYSPHAGIA (ALS vs PLS; + sensory neuropathy from ETOH?) - follow up with ALS clinic (Dr. Tillman Abide)  CEREBROVASCULAR DISEASE - Risk factor mgmt per PCP for cerebrovascular disease - continue smoking and ETOH cessation  CERVICAL SPINAL STENOSIS - Conservative mgmt of cervical spinal stenosis (mild at C5-6)  Meds ordered this encounter  Medications  . levETIRAcetam (KEPPRA) 500 MG tablet    Sig: Take 1 tablet (500 mg total) by mouth 2 (two) times daily.    Dispense:  180 tablet    Refill:  4   Return in about 1 year (around 11/12/2019).    Penni Bombard, MD 7/62/8315, 1:76 PM Certified in Neurology, Neurophysiology and Neuroimaging  [facility] [address], [place], [place] [address] [phone]

## 2018-11-12 MED FILL — levETIRAcetam 500 MG TABS: 500 | 6 days supply | Qty: 180 | Fill #0

## 2018-11-13 ENCOUNTER — Encounter: Payer: Self-pay | Admitting: Family Medicine

## 2018-11-13 ENCOUNTER — Telehealth: Payer: Self-pay

## 2018-11-13 ENCOUNTER — Ambulatory Visit: Payer: Medicare HMO | Attending: Family Medicine | Admitting: Family Medicine

## 2018-11-13 VITALS — BP 109/71 | HR 60 | Temp 98.3°F | Ht 74.0 in

## 2018-11-13 DIAGNOSIS — L89321 Pressure ulcer of left buttock, stage 1: Secondary | ICD-10-CM | POA: Diagnosis not present

## 2018-11-13 DIAGNOSIS — R059 Cough, unspecified: Secondary | ICD-10-CM

## 2018-11-13 DIAGNOSIS — R05 Cough: Secondary | ICD-10-CM

## 2018-11-13 MED ORDER — BACITRACIN 500 UNIT/GM EX OINT: 1.0000 "application " | TOPICAL_OINTMENT | Freq: Two times a day (BID) | CUTANEOUS | 2 refills | Status: DC

## 2018-11-13 MED ORDER — MISC. DEVICES MISC: 0 refills | Status: DC

## 2018-11-13 NOTE — Progress Notes (Signed)
Subjective:  Patient ID: Darrell [name], male    DOB: 1950-12-20  Age: 68 y.o. MRN: [id number]  CC: Hospitalization Follow-up   HPI DEION [name] is a 68 year old male with a history of hypertension, previous CVA with residual left-sided weakness, seizure in the setting of prior CVA, second degree AV block (status post medtronic dual chamber pacemaker) stage III CKD who presents today accompanied by his spouse and daughter complaining of a sacral ulcer that has been present for the last 10 days. Spouse has been applying triple antibiotic ointment along with a Band-Aid. [name] spends most of his time sitting in a chair or lying in the bed.  Also is slightly painful.  He had an ED visit 2 weeks ago for seizure-like activity at which time a Foley catheter had been inserted and he subsequently presented later in the day to the ED again for hematuria and urinalysis performed at the ED was negative for blood. He denies hematuria at this time. Spouse complains the patient has had a cough for a few weeks which "ounds wet" but he denies shortness of breath or wheezing or chest pain.  Past Medical History:  Diagnosis Date  . Arthritis   . At high risk for falls 08/16/2015  . Bradycardia   . Chronic kidney disease    stage 3 GFR 30-59 ml/min   . CIDP (chronic inflammatory demyelinating polyneuropathy) ([place])   . CKD (chronic kidney disease) stage 3, GFR 30-59 ml/min ([name]) 08/16/2015  . Dysphagia as late effect of cerebrovascular disease    pts wife states pt has to eat soft foods   . Elevated liver enzymes 08/10/2016  . GERD (gastroesophageal reflux disease)   . Glaucoma   . High cholesterol   . History of CVA with residual deficit 03/25/2013  . Hypertension   . Hypertensive retinopathy of both eyes 01/16/2017  . Inguinal hernia 03/25/2013  . Liver hemangioma 08/14/2016  . Neuromuscular disorder ([place])    chronic inflammatory demyelinating polyneuropathy   . New onset seizure ([place]) 07/08/2017     seizure 07/14/18  . Nuclear sclerosis of both eyes 10/25/2016  . Presence of permanent cardiac pacemaker    placed in april 2018  . Primary open angle glaucoma of both eyes, indeterminate stage 10/25/2016  . Renal mass, right 08/14/2016  . Status cardiac pacemaker 01/29/2017   Placed for second degree heart block on 01/16/17 Medtronic Azure XT DR MRI SureScan dual-chamber pacemaker  . Stroke [facility])    2011 with residual deficit left sided weakness  . Tobacco dependence     Past Surgical History:  Procedure Laterality Date  . EYE SURGERY    . HERNIA REPAIR    . INGUINAL HERNIA REPAIR Right 11/23/2014   Procedure: right inguinal hernia repair with mesh;  Surgeon: Armandina Gemma, MD;  Location: [facility];  Service: General;  Laterality: Right;  . INSERTION OF MESH N/A 11/23/2014   Procedure: INSERTION OF MESH;  Surgeon: Armandina Gemma, MD;  Location: [facility];  Service: General;  Laterality: N/A;  . MASS EXCISION Left 08/29/2017   Procedure: EXCISION OF LEFT NECK MASS;  Surgeon: Coralie Keens, MD;  Location: [place];  Service: General;  Laterality: Left;  . PACEMAKER IMPLANT N/A 01/16/2017   Procedure: Pacemaker Implant;  Surgeon: Will Meredith Leeds, MD;  Location: [place] [facility];  Service: Cardiovascular;  Laterality: N/A;  . SHOULDER SURGERY Bilateral 1988, 1998    Family History  Problem Relation Age of Onset  . Hypertension  Mother   . Diabetes Sister   . Hypertension Sister   . Cancer Sister        1 sister  . COPD Sister        in 1 sister    Allergies  Allergen Reactions  . Ace Inhibitors Other (See Comments)    Hyperkalemia  . Doxycycline Other (See Comments)    Hiccups, cough, nausea and emesis, elevated liver enzymes, elevated eosinophils, SOB concerning for early DRESS syndrome   . Atacand Hct [Candesartan Cilexetil-Hctz] Hives  . Shellfish Allergy Hives    Outpatient Medications Prior to Visit  Medication Sig Dispense Refill  . aspirin EC 81 MG tablet Take 1 tablet (81  mg total) by mouth daily. 90 tablet 3  . atorvastatin (LIPITOR) 40 MG tablet Take 1 tablet (40 mg total) by mouth daily. 90 tablet 1  . brimonidine (ALPHAGAN P) 0.1 % SOLN Place 1 drop into both eyes daily.     . cetirizine (ZYRTEC) 10 MG tablet Take 1 tablet (10 mg total) by mouth daily. 30 tablet 1  . divalproex (DEPAKOTE) 500 MG DR tablet Take 500 mg by mouth 2 (two) times daily.    . dorzolamide-timolol (COSOPT) 22.3-6.8 MG/ML ophthalmic solution Place 1 drop into both eyes daily.     . ergocalciferol (DRISDOL) 1.25 MG (50000 UT) capsule Take 1 capsule (50,000 Units total) by mouth once a week. 9 capsule 1  . fluticasone (FLONASE) 50 MCG/ACT nasal spray Place 2 sprays into both nostrils daily. 16 g 6  . folic acid (FOLVITE) 1 MG tablet Take 1 tablet (1 mg total) by mouth daily. 90 tablet 1  . hydrALAZINE (APRESOLINE) 100 MG tablet Take 1 tablet (100 mg total) by mouth 3 (three) times daily. 270 tablet 1  . ketoconazole (NIZORAL) 2 % cream Apply 1 application topically daily as needed for irritation.    Marland Kitchen latanoprost (XALATAN) 0.005 % ophthalmic solution Place 1 drop into both eyes at bedtime.    . levETIRAcetam (KEPPRA) 500 MG tablet Take 1 tablet (500 mg total) by mouth 2 (two) times daily. 180 tablet 4  . lisinopril (PRINIVIL,ZESTRIL) 10 MG tablet Take 1 tablet (10 mg total) by mouth daily. 90 tablet 1  . metoprolol tartrate (LOPRESSOR) 50 MG tablet Take 1 tablet (50 mg total) by mouth 2 (two) times daily. 180 tablet 2  . Multiple Vitamins-Iron (DAILY VITAMIN FORMULA+IRON) TABS Take 1 tablet by mouth daily.    . polyethylene glycol (MIRALAX / GLYCOLAX) packet Take 17 g by mouth daily as needed for mild constipation. 30 each 3  . Sennosides-Docusate Sodium (SENNA S PO) Take 1 tablet by mouth daily as needed (constipation).     . terbinafine (LAMISIL AT) 1 % cream Apply 1 application topically 2 (two) times daily. To R foot 30 g 0  . thiamine 100 MG tablet Take 1 tablet (100 mg total) by mouth  daily. 30 tablet 0  . torsemide (DEMADEX) 20 MG tablet Take 20 mg by mouth daily.     No facility-administered medications prior to visit.      ROS Review of Systems General: negative for fever, weight loss, appetite change Eyes: no visual symptoms. ENT: no ear symptoms, no sinus tenderness, no nasal congestion or sore throat. Neck: no pain  Respiratory: no wheezing, shortness of breath, +cough Cardiovascular: no chest pain, no dyspnea on exertion, no pedal edema, no orthopnea. Gastrointestinal: no abdominal pain, no diarrhea, no constipation Genito-Urinary: no urinary frequency, no dysuria, no polyuria. Hematologic:  no bruising Endocrine: no cold or heat intolerance Neurological: no headaches, no seizures, no tremors Musculoskeletal: no joint pains, no joint swelling Skin: see hpi Psychological: no depression, no anxiety,    Objective:  BP 109/71   Pulse 60   Temp 98.3 F (36.8 C) (Oral)   Ht 6\' 2"  (1.88 m)   SpO2 93%   BMI 21.83 kg/m   BP/Weight 11/13/2018 8/93/7342 05/01/6810  Systolic BP 572 98 96  Diastolic BP 71 64 68  Wt. (Lbs) - - -  BMI 21.83 21.83 21.83      Physical Exam Constitutional:      Appearance: He is well-developed.  Cardiovascular:     Rate and Rhythm: Normal rate.     Heart sounds: Normal heart sounds. No murmur.  Pulmonary:     Effort: Pulmonary effort is normal.     Breath sounds: Normal breath sounds. No wheezing or rales.  Chest:     Chest wall: No tenderness.  Abdominal:     General: Bowel sounds are normal. There is no distension.     Palpations: Abdomen is soft. There is no mass.     Tenderness: There is no abdominal tenderness.  Musculoskeletal:     Comments: Spasticity of extremities  Skin:    Comments: Stage 1 Decubitus ulcer with erythema of base  Neurological:     Mental Status: He is alert and oriented to person, place, and time.     CMP Latest Ref Rng & Units 10/29/2018 07/14/2018 06/13/2018  Glucose 70 - 99 mg/dL  106(H) 125(H) 84  BUN 8 - 23 mg/dL 43(H) 26(H) 19  Creatinine 0.61 - 1.24 mg/dL 2.48(H) 2.09(H) 1.78(H)  Sodium 135 - 145 mmol/L 138 141 143  Potassium 3.5 - 5.1 mmol/L 4.7 4.3 4.4  Chloride 98 - 111 mmol/L 100 105 103  CO2 22 - 32 mmol/L 31 27 26   Calcium 8.9 - 10.3 mg/dL 9.7 10.3 10.8(H)  Total Protein 6.5 - 8.1 g/dL 5.8(L) - 6.6  Total Bilirubin 0.3 - 1.2 mg/dL 0.9 - 0.4  Alkaline Phos 38 - 126 U/L 49 - 92  AST 15 - 41 U/L 34 - 8  ALT 0 - 44 U/L 23 - 6    Lipid Panel     Component Value Date/Time   CHOL 112 06/13/2018 0954   TRIG 51 06/13/2018 0954   HDL 40 06/13/2018 0954   CHOLHDL 2.8 06/13/2018 0954   CHOLHDL 4.1 08/16/2015 1555   VLDL 23 08/16/2015 1555   LDLCALC 62 06/13/2018 0954    CBC    Component Value Date/Time   WBC 8.3 10/29/2018 0206   RBC 3.56 (L) 10/29/2018 0206   HGB 10.8 (L) 10/29/2018 0206   HGB 12.7 (L) 06/13/2018 0954   HCT 33.6 (L) 10/29/2018 0206   HCT 38.1 06/13/2018 0954   PLT 163 10/29/2018 0206   PLT 321 06/13/2018 0954   MCV 94.4 10/29/2018 0206   MCV 85 06/13/2018 0954   MCH 30.3 10/29/2018 0206   MCHC 32.1 10/29/2018 0206   RDW 16.7 (H) 10/29/2018 0206   RDW 13.1 06/13/2018 0954   LYMPHSABS 2.9 10/29/2018 0206   LYMPHSABS 2.6 06/13/2018 0954   MONOABS 1.0 10/29/2018 0206   EOSABS 0.4 10/29/2018 0206   EOSABS 0.5 (H) 06/13/2018 0954   BASOSABS 0.0 10/29/2018 0206   BASOSABS 0.1 06/13/2018 0954    Lab Results  Component Value Date   HGBA1C 6.2 09/12/2018    Assessment & Plan:   1. Pressure  injury of left buttock, stage 1 Prescribed bacitracin ointment and clean dressing applied in clinic Advised to avoid prolonged sitting with recommended turning and changing position every 2 hours Prescribed donut cushion - Ambulatory referral to [place]  2. Cough Advised to use Mucinex or Zyrtec OTC   Meds ordered this encounter  Medications  . Misc. Devices MISC    Sig: Donut cushion. Dx- Decubitus ulcer of Sacrum     Dispense:  1 each    Refill:  0  . bacitracin 500 UNIT/GM ointment    Sig: Apply 1 application topically 2 (two) times daily.    Dispense:  28 g    Refill:  2    Follow-up: Return for Up of chronic medical conditions, keep previously scheduled appointment.       [name], MD, FAAFP. [facility] [place], [place]   11/13/2018, 12:47 PM

## 2018-11-13 NOTE — Patient Instructions (Signed)
Pressure Injury  A pressure injury is damage to the skin and underlying tissue that results from pressure being applied to an area of the body. It often affects people who must spend a long time in a bed or chair because of a medical condition. Pressure injuries usually occur:  Over bony parts of the body, such as the tailbone, shoulders, elbows, hips, heels, spine, ankles, and back of the head.  Under medical devices that make contact with the body, such as respiratory equipment, stockings, tubes, and splints. Pressure injuries start as reddened areas on the skin and can lead to pain and an open wound. What are the causes? This condition is caused by frequent or constant pressure to an area of the body. Decreased blood flow to the skin can eventually cause the skin tissue to die and break down, causing a wound. What increases the risk? You are more likely to develop this condition if you:  Are in the hospital or an extended care facility.  Are bedridden or in a wheelchair.  Have an injury or disease that keeps you from: ? Moving normally. ? Feeling pain or pressure.  Have a condition that: ? Makes you sleepy or less alert. ? Causes poor blood flow.  Need to wear a medical device.  Have poor control of your bladder or bowel functions (incontinence).  Have poor nutrition (malnutrition). If you are at risk for pressure injuries, your health care provider may recommend certain types of mattresses, mattress covers, pillows, cushions, or boots to help prevent them. These may include products filled with air, foam, gel, or sand. What are the signs or symptoms? Symptoms of this condition depend on the severity of the injury. Symptoms may include:  Red or dark areas of the skin.  Pain, warmth, or a change of skin texture.  Blisters.  An open wound. How is this diagnosed? This condition is diagnosed with a medical history and physical exam. You may also have tests, such as:  Blood  tests.  Imaging tests.  Blood flow tests. Your pressure injury will be staged based on its severity. Staging is based on:  The depth of the tissue injury, including whether there is exposure of muscle, bone, or tendon.  The cause of the pressure injury. How is this treated? This condition may be treated by:  Relieving or redistributing pressure on your skin. This includes: ? Frequently changing your position. ? Avoiding positions that caused the wound or that can make the wound worse. ? Using specific bed mattresses, chair cushions, or protective boots. ? Moving medical devices from an area of pressure, or placing padding between the skin and the device. ? Using foams, creams, or powders to prevent rubbing (friction) on the skin.  Keeping your skin clean and dry. This may include using a skin cleanser or skin barrier as told by your health care provider.  Cleaning your injury and removing any dead tissue from the wound (debridement).  Placing a bandage (dressing) over your injury.  Using medicines for pain or to prevent or treat infection. Surgery may be needed if other treatments are not working or if your injury is very deep. Follow these instructions at home: Wound care  Follow instructions from your health care provider about how to take care of your wound. Make sure you: ? Wash your hands with soap and water before and after you change your bandage (dressing). If soap and water are not available, use hand sanitizer. ? Change your dressing as told   by your health care provider.  Check your wound every day for signs of infection. Have a caregiver do this for you if you are not able. Check for: ? Redness, swelling, or increased pain. ? More fluid or blood. ? Warmth. ? Pus or a bad smell. Skin care  Keep your skin clean and dry. Gently pat your skin dry.  Do not rub or massage your skin.  You or a caregiver should check your skin every day for any changes in color or  any new blisters or sores (ulcers). Medicines  Take over-the-counter and prescription medicines only as told by your health care provider.  If you were prescribed an antibiotic medicine, take or apply it as told by your health care provider. Do not stop using the antibiotic even if your condition improves. Reducing and redistributing pressure  Do not lie or sit in one position for a long time. Move or change position every 1-2 hours, or as told by your health care provider.  Use pillows or cushions to reduce pressure. Ask your health care provider to recommend cushions or pads for you. General instructions   Eat a healthy diet that includes lots of protein.  Drink enough fluid to keep your urine pale yellow.  Be as active as you can every day. Ask your health care provider to suggest safe exercises or activities.  Do not abuse drugs or alcohol.  Do not use any products that contain nicotine or tobacco, such as cigarettes, e-cigarettes, and chewing tobacco. If you need help quitting, ask your health care provider.  Keep all follow-up visits as told by your health care provider. This is important. Contact a health care provider if:  You have: ? A fever or chills. ? Pain that is not helped by medicine. ? Any changes in skin color. ? New blisters or sores. ? Pus or a bad smell coming from your wound. ? Redness, swelling, or pain around your wound. ? More fluid or blood coming from your wound.  Your wound does not improve after 1-2 weeks of treatment. Summary  A pressure injury is damage to the skin and underlying tissue that results from pressure being applied to an area of the body.  Do not lie or sit in one position for a long time. Your health care provider may advise you to move or change position every 1-2 hours.  Follow instructions from your health care provider about how to take care of your wound.  Keep all follow-up visits as told by your health care provider. This  is important. This information is not intended to replace advice given to you by your health care provider. Make sure you discuss any questions you have with your health care provider. Document Released: 09/11/2005 Document Revised: 04/10/2018 Document Reviewed: 04/10/2018 Elsevier Interactive Patient Education  2019 [employer]  

## 2018-11-13 NOTE — Telephone Encounter (Signed)
Referral received for home health care.  Calls placed to patient # [phone] and [phone]  to inquire if he has preference for home health agencies. Messages left a both numbers requesting a call back to this CM # [phone].

## 2018-11-13 NOTE — Progress Notes (Signed)
Patient has sore on butt that has been there for 10 days.

## 2018-11-14 ENCOUNTER — Telehealth: Payer: Self-pay | Admitting: Family Medicine

## 2018-11-14 ENCOUNTER — Telehealth: Payer: Self-pay

## 2018-11-14 NOTE — Telephone Encounter (Signed)
Call placed to [facility] spoke to [place] who confirmed that they are in network with [employer].    Referral faxed to [facility] - fax # [phone]

## 2018-11-14 NOTE — Telephone Encounter (Signed)
Mel[facility] states they were sent a home health referral need to know if there is any specific wound care needed currently only have orders to apply ointment to wound which is not considered a nursing need would like to know if there is any other orders pt requires please follow up

## 2018-11-14 NOTE — Telephone Encounter (Signed)
Call placed to patient to inquire if he has a preference for home health agencies.  Spoke to the patient and his wife, Darrell [name].  She stated that they have no preference. Informed her that an agency will need to be identified that is in network with [employer].  She verbalized understanding.

## 2018-11-18 NOTE — Telephone Encounter (Signed)
Call placed to [facility] with [facility].  She is requesting clarification of the wound care orders. Informed her that Dr Margarita Rana is giving  approval for the home health nurse to assess the wound and treat per [facility] protocol.  Also ordered is to assess the patient for a donut cushion.

## 2018-11-19 MED FILL — METOPROLOL TARTRATE 50 MG T: 50 | 30 days supply | Qty: 60 | Fill #3

## 2018-11-19 MED FILL — VIT D2 1.25 MG (50,000 UNIT: 1.25 MG | 28 days supply | Qty: 4 | Fill #2

## 2018-11-20 ENCOUNTER — Telehealth: Payer: Self-pay

## 2018-11-20 NOTE — Telephone Encounter (Signed)
Call received from [facility] with [facility]. She explained that the nurse manager reviewed the referral and they are not able to accept it.  She said that the care requested is not " skilled enough"  - putting cream on a wound,

## 2018-11-20 NOTE — Telephone Encounter (Signed)
Noted  

## 2018-11-27 DIAGNOSIS — Z7409 Other reduced mobility: Secondary | ICD-10-CM | POA: Diagnosis not present

## 2018-11-27 DIAGNOSIS — R471 Dysarthria and anarthria: Secondary | ICD-10-CM | POA: Diagnosis not present

## 2018-11-27 DIAGNOSIS — R05 Cough: Secondary | ICD-10-CM | POA: Diagnosis not present

## 2018-11-27 DIAGNOSIS — R0981 Nasal congestion: Secondary | ICD-10-CM | POA: Diagnosis not present

## 2018-11-27 DIAGNOSIS — R569 Unspecified convulsions: Secondary | ICD-10-CM | POA: Diagnosis not present

## 2018-11-27 DIAGNOSIS — G1221 Amyotrophic lateral sclerosis: Secondary | ICD-10-CM | POA: Diagnosis not present

## 2018-11-27 DIAGNOSIS — R269 Unspecified abnormalities of gait and mobility: Secondary | ICD-10-CM | POA: Diagnosis not present

## 2018-11-27 DIAGNOSIS — R531 Weakness: Secondary | ICD-10-CM | POA: Diagnosis not present

## 2018-11-27 DIAGNOSIS — R296 Repeated falls: Secondary | ICD-10-CM | POA: Diagnosis not present

## 2018-11-27 DIAGNOSIS — I69334 Monoplegia of upper limb following cerebral infarction affecting left non-dominant side: Secondary | ICD-10-CM | POA: Diagnosis not present

## 2018-11-27 DIAGNOSIS — I6930 Unspecified sequelae of cerebral infarction: Secondary | ICD-10-CM | POA: Diagnosis not present

## 2018-11-27 DIAGNOSIS — R252 Cramp and spasm: Secondary | ICD-10-CM | POA: Diagnosis not present

## 2018-11-27 DIAGNOSIS — Z720 Tobacco use: Secondary | ICD-10-CM | POA: Diagnosis not present

## 2018-11-27 DIAGNOSIS — Z79899 Other long term (current) drug therapy: Secondary | ICD-10-CM | POA: Diagnosis not present

## 2018-11-27 DIAGNOSIS — R1310 Dysphagia, unspecified: Secondary | ICD-10-CM | POA: Diagnosis not present

## 2018-11-27 DIAGNOSIS — Z9181 History of falling: Secondary | ICD-10-CM | POA: Diagnosis not present

## 2018-11-27 DIAGNOSIS — R69 Illness, unspecified: Secondary | ICD-10-CM | POA: Diagnosis not present

## 2018-11-27 DIAGNOSIS — R1319 Other dysphagia: Secondary | ICD-10-CM | POA: Diagnosis not present

## 2018-11-28 DIAGNOSIS — N183 Chronic kidney disease, stage 3 (moderate): Secondary | ICD-10-CM | POA: Diagnosis not present

## 2018-11-28 DIAGNOSIS — I129 Hypertensive chronic kidney disease with stage 1 through stage 4 chronic kidney disease, or unspecified chronic kidney disease: Secondary | ICD-10-CM | POA: Diagnosis not present

## 2018-11-28 MED FILL — hydrALAZINE HCL 25 MG TABS: 25 | 30 days supply | Qty: 90 | Fill #0 | Status: TO

## 2018-11-30 MED FILL — TORSEMIDE 20 MG TABLET: 20 | 30 days supply | Qty: 30 | Fill #1

## 2018-12-03 ENCOUNTER — Ambulatory Visit: Payer: Medicare HMO | Admitting: Gastroenterology

## 2018-12-03 DIAGNOSIS — I129 Hypertensive chronic kidney disease with stage 1 through stage 4 chronic kidney disease, or unspecified chronic kidney disease: Secondary | ICD-10-CM | POA: Diagnosis not present

## 2018-12-03 DIAGNOSIS — N183 Chronic kidney disease, stage 3 (moderate): Secondary | ICD-10-CM | POA: Diagnosis not present

## 2018-12-09 MED FILL — ATORVASTATIN 40 MG TABLET: 40 | 30 days supply | Qty: 30 | Fill #0 | Status: TO

## 2018-12-09 MED FILL — VIT D2 1.25 MG (50,000 UNIT: 1.25 MG | 28 days supply | Qty: 4 | Fill #3 | Status: TO

## 2018-12-20 ENCOUNTER — Other Ambulatory Visit: Payer: Self-pay | Admitting: Cardiology

## 2018-12-23 ENCOUNTER — Other Ambulatory Visit: Payer: Self-pay

## 2018-12-23 ENCOUNTER — Ambulatory Visit (INDEPENDENT_AMBULATORY_CARE_PROVIDER_SITE_OTHER): Payer: Medicare HMO | Admitting: *Deleted

## 2018-12-23 DIAGNOSIS — I441 Atrioventricular block, second degree: Secondary | ICD-10-CM

## 2018-12-23 DIAGNOSIS — R001 Bradycardia, unspecified: Secondary | ICD-10-CM

## 2018-12-23 LAB — CUP PACEART REMOTE DEVICE CHECK
Battery Remaining Longevity: 118 mo
Battery Voltage: 3.01 V
Brady Statistic AP VP Percent: 2.22 %
Brady Statistic AS VP Percent: 0.03 %
Brady Statistic RA Percent Paced: 79.88 %
Brady Statistic RV Percent Paced: 2.26 %
Date Time Interrogation Session: 20200330112038
Implantable Lead Implant Date: 20180424
Implantable Lead Location: 753859
Implantable Lead Location: 753860
Implantable Lead Model: 5076
Implantable Lead Model: 5076
Implantable Pulse Generator Implant Date: 20180424
Lead Channel Impedance Value: 304 Ohm
Lead Channel Impedance Value: 361 Ohm
Lead Channel Impedance Value: 399 Ohm
Lead Channel Pacing Threshold Amplitude: 0.625 V
Lead Channel Pacing Threshold Amplitude: 0.625 V
Lead Channel Pacing Threshold Pulse Width: 0.4 ms
Lead Channel Pacing Threshold Pulse Width: 0.4 ms
Lead Channel Sensing Intrinsic Amplitude: 1 mV
Lead Channel Sensing Intrinsic Amplitude: 1 mV
Lead Channel Sensing Intrinsic Amplitude: 9.625 mV
Lead Channel Sensing Intrinsic Amplitude: 9.625 mV
Lead Channel Setting Pacing Amplitude: 1.5 V
Lead Channel Setting Pacing Amplitude: 2.5 V
Lead Channel Setting Pacing Pulse Width: 0.4 ms
Lead Channel Setting Sensing Sensitivity: 2 mV
MDC IDC LEAD IMPLANT DT: 20180424
MDC IDC MSMT LEADCHNL RA IMPEDANCE VALUE: 380 Ohm
MDC IDC STAT BRADY AP VS PERCENT: 77.6 %
MDC IDC STAT BRADY AS VS PERCENT: 20.14 %

## 2018-12-30 NOTE — Progress Notes (Signed)
Carelink Summary Report / Loop Recorder 

## 2019-01-21 ENCOUNTER — Telehealth: Payer: Self-pay | Admitting: Family Medicine

## 2019-01-21 NOTE — Telephone Encounter (Signed)
I spoke with the patients wife (ok per DPR) who consented to the TELEPHONE VISIT and insurance being billed. I made her aware the nurse would be reaching out before his apt. DW

## 2019-01-22 ENCOUNTER — Other Ambulatory Visit: Payer: Self-pay | Admitting: Family Medicine

## 2019-01-22 NOTE — Telephone Encounter (Signed)
LMVM for pt on both mobile, and home to return call  to update chart.

## 2019-01-23 ENCOUNTER — Encounter: Payer: Self-pay | Admitting: Family Medicine

## 2019-01-23 ENCOUNTER — Other Ambulatory Visit: Payer: Self-pay

## 2019-01-23 ENCOUNTER — Ambulatory Visit (INDEPENDENT_AMBULATORY_CARE_PROVIDER_SITE_OTHER): Payer: Medicare HMO | Admitting: Family Medicine

## 2019-01-23 DIAGNOSIS — R569 Unspecified convulsions: Secondary | ICD-10-CM

## 2019-01-23 NOTE — Progress Notes (Signed)
PATIENT: Darrell [name] DOB: Sep 04, 1951  REASON FOR VISIT: follow up HISTORY FROM: patient  Virtual Visit via Telephone Note  I connected with Darrell [name] on 01/23/19 at  1:30 PM EDT by telephone and verified that I am speaking with the correct person using two identifiers.   I discussed the limitations, risks, security and privacy concerns of performing an evaluation and management service by telephone and the availability of in person appointments. I also discussed with the patient that there may be a patient responsible charge related to this service. The patient expressed understanding and agreed to proceed.   History of Present Illness:  01/23/19 Darrell [name] is a 68 y.o. male for follow up of seizure. He is tolerating levetiracetam 500mg  BID well without adverse effects. He denies seizure activity since last being seen. He thinks last seizure was in 06/2018. He is doing well and without concerns today.    HISTORY (copied from Dr Gladstone Lighter note on 11/11/2018)  UPDATE (11/11/18, VRP): Since last visit, had seizure on 10/29/18; depakote was increased to 1250mg  twice a day. then could not afford medication, so we changed levetiracetam to 500mg  twice a day. Now tolerating meds, and more affordable.   UPDATE (07/24/18, VRP): Since last visit, has had 2nd opinion at [facility], and now likely has ALS or PLS diagnosis. Symptoms are progressive. Severity is severe. No alleviating or aggravating factors. Has follow up in [place] clinic.  Also had 2 more seizures on 07/14/18; VPA level at ER was undetectable. Given IV loading dose and sent home.   UPDATE (03/19/18, VRP): Since last visit, doing well. No seizures. No alleviating or aggravating factors. More muscle stiffness.   NEW HPI (09/13/17, VRP): 68 year old male with CIDP, here for seizure evaluation. Since last visit, patient declined IVIG therapy that I had ordered in 2015, and then patient lost to followup. Gait and balance continue to  decline. 20lb weight loss over last year. Now with new onset seizure in Oct 2018 at home (witnessed by wife). Admitted to hospital and started on levetiracetam, then changed to divalproex 500mg  twice a day, and doing well. No further seizures. No alleviating or aggravating factors.   UPDATE 03/30/14: Since last visit, patient feels strength is stable. Still with slurred speech, swallow diff, balance diff. Now he is interested in IVIG treatment. Completed PT sessions.  UPDATE 11/25/13: Since last visit patient had EMG and spinal tap. Findings are suspicious for CIDP. Patient's symptoms have gradually progressed. Continues to have more upper and lower extremity weakness, dysarthria, dysphasia and intermittent muscle twitching. Patient is frustrated today and does not want to take anymore medication. Patient's wife would like to find out more options for treatment.  UPDATE 10/03/13: Since last visit, continues to have left sided weakness, worsening speech and swallow difficulty. Is drinking half pint Etoh per 1-2 weeks. Cigarette use has increased to 2 packs per day.  PRIOR HPI (04/03/13): 68 year old right-handed male with hypertension, hypercholesterolemia, smoking, alcohol abuse, here for evaluation of gait and balance difficulty for past one year.  2011 patient had left arm and left leg weakness, diagnosed with stroke. Apparently he was admitted to hospital and treated. He had fairly good recovery initial one year after stroke. He required using a walking stick but otherwise was fairly mobile. Unfortunately he did not have good medical followup at that time. Patient was abusing alcohol significantly at that time, ranging from 1 pint up to a fifth of alcohol per day. In the past  one to 2 years he has cut down on his drinking, now averaging 1 pint of alcohol per week.  In the past one year, patient's mobility is significantly deteriorated. He also has increasing slurred speech, trouble swallowing, balance  and gait difficulty. He continues to fall down. Symptoms are worse when he drinks alcohol. Nowadays he is unable to stand up and walk across a room without assistance. This is been the case for the past one year.  Observations/Objective:  Generalized: in no acute distress  Mentation: slow but Alert oriented to time, place, history taking. Follows all commands speech and language fluent   Assessment and Plan:  68 y.o. year old male  has a past medical history of Arthritis, At high risk for falls (08/16/2015), Bradycardia, Chronic kidney disease, CIDP (chronic inflammatory demyelinating polyneuropathy) ([place]), CKD (chronic kidney disease) stage 3, GFR 30-59 ml/min ([name]) (08/16/2015), Dysphagia as late effect of cerebrovascular disease, Elevated liver enzymes (08/10/2016), GERD (gastroesophageal reflux disease), Glaucoma, High cholesterol, History of CVA with residual deficit (03/25/2013), Hypertension, Hypertensive retinopathy of both eyes (01/16/2017), Inguinal hernia (03/25/2013), Liver hemangioma (08/14/2016), Neuromuscular disorder ([place]), New onset seizure ([place]) (07/08/2017), Nuclear sclerosis of both eyes (10/25/2016), Presence of permanent cardiac pacemaker, Primary open angle glaucoma of both eyes, indeterminate stage (10/25/2016), Renal mass, right (08/14/2016), Status cardiac pacemaker (01/29/2017), Stroke ([place]), and Tobacco dependence. with    ICD-10-CM   1. Seizures ([place]) R56.9    We have attempted video visit, however, patient's internet access is limited and will not accommodate visit. He is feeling ok today. He is taking levetriacetam 500mg  daily and denies seizure activity. His wife states that overall he is doing well. They do continue close follow up with his PCP for multiple chronic diseases. He requests a follow up in the office to meet with Dr Leta Baptist when it is safe to do so. I will have him schedule 3 month follow up. He verbalizes understanding and agreement with this plan.   No orders  of the defined types were placed in this encounter.   No orders of the defined types were placed in this encounter.    Follow Up Instructions:  I discussed the assessment and treatment plan with the patient. The patient was provided an opportunity to ask questions and all were answered. The patient agreed with the plan and demonstrated an understanding of the instructions.   The patient was advised to call back or seek an in-person evaluation if the symptoms worsen or if the condition fails to improve as anticipated.  I provided 35 minutes of non-face-to-face time during this encounter. Patient is located at his place of residence during teleconference.  Limited Internet access inhibits video conference.  Provider is located at her place of residence.  Liane Comber, RN helped to facilitate visit.   Debbora Presto, NP

## 2019-02-10 NOTE — Progress Notes (Signed)
I reviewed note and agree with plan.   Penni Bombard, MD 04/15/5871, 7:61 PM Certified in Neurology, Neurophysiology and Neuroimaging  [facility] [address] [place], [place] [id number] [phone]

## 2019-02-11 ENCOUNTER — Telehealth: Payer: Self-pay

## 2019-02-11 NOTE — Telephone Encounter (Signed)
Spoke with patients wife regarding manual transmissions, states she is away from the patient. Wife requested that we call back to speak to the patient tomorrow morning.

## 2019-02-12 ENCOUNTER — Other Ambulatory Visit: Payer: Self-pay | Admitting: Pharmacist

## 2019-02-12 DIAGNOSIS — I69354 Hemiplegia and hemiparesis following cerebral infarction affecting left non-dominant side: Secondary | ICD-10-CM

## 2019-02-12 MED ORDER — ATORVASTATIN CALCIUM 40 MG PO TABS: 40.0000 mg | ORAL_TABLET | Freq: Every day | ORAL | 0 refills | Status: DC

## 2019-02-18 NOTE — Telephone Encounter (Signed)
Spoke to pt wife regarding frequent manual transmissions and occasional NSVT episodes, pt denies symptoms.

## 2019-02-20 ENCOUNTER — Other Ambulatory Visit: Payer: Self-pay | Admitting: Family Medicine

## 2019-02-25 ENCOUNTER — Ambulatory Visit: Payer: Medicare HMO | Attending: Family Medicine | Admitting: Family Medicine

## 2019-02-25 ENCOUNTER — Encounter: Payer: Self-pay | Admitting: Family Medicine

## 2019-02-25 ENCOUNTER — Other Ambulatory Visit: Payer: Self-pay

## 2019-02-25 DIAGNOSIS — N183 Chronic kidney disease, stage 3 unspecified: Secondary | ICD-10-CM

## 2019-02-25 DIAGNOSIS — I10 Essential (primary) hypertension: Secondary | ICD-10-CM | POA: Diagnosis not present

## 2019-02-25 DIAGNOSIS — I69354 Hemiplegia and hemiparesis following cerebral infarction affecting left non-dominant side: Secondary | ICD-10-CM

## 2019-02-25 DIAGNOSIS — Z1211 Encounter for screening for malignant neoplasm of colon: Secondary | ICD-10-CM | POA: Diagnosis not present

## 2019-02-25 DIAGNOSIS — R399 Unspecified symptoms and signs involving the genitourinary system: Secondary | ICD-10-CM | POA: Diagnosis not present

## 2019-02-25 DIAGNOSIS — K5909 Other constipation: Secondary | ICD-10-CM | POA: Diagnosis not present

## 2019-02-25 DIAGNOSIS — G1220 Motor neuron disease, unspecified: Secondary | ICD-10-CM

## 2019-02-25 MED ORDER — HYDRALAZINE HCL 100 MG PO TABS: 100.0000 mg | ORAL_TABLET | Freq: Three times a day (TID) | ORAL | 1 refills | Status: DC

## 2019-02-25 MED ORDER — LACTULOSE 10 GM/15ML PO SOLN: 10.0000 g | Freq: Every day | ORAL | 1 refills | Status: DC

## 2019-02-25 MED ORDER — ATORVASTATIN CALCIUM 40 MG PO TABS: 40.0000 mg | ORAL_TABLET | Freq: Every day | ORAL | 1 refills | Status: DC

## 2019-02-25 MED ORDER — LISINOPRIL 10 MG PO TABS: 10.0000 mg | ORAL_TABLET | Freq: Every day | ORAL | 1 refills | Status: DC

## 2019-02-25 MED ORDER — TAMSULOSIN HCL 0.4 MG PO CAPS: 0.4000 mg | ORAL_CAPSULE | Freq: Every day | ORAL | 3 refills | Status: DC

## 2019-02-25 MED ORDER — METOPROLOL TARTRATE 50 MG PO TABS: 50.0000 mg | ORAL_TABLET | Freq: Two times a day (BID) | ORAL | 6 refills | Status: DC

## 2019-02-25 NOTE — Progress Notes (Signed)
Virtual Visit via Telephone Note  I connected with Darrell [name], on 02/25/2019 at 9:09 AM by telephone due to the COVID-19 pandemic and verified that I am speaking with the correct person using two identifiers.   Consent: I discussed the limitations, risks, security and privacy concerns of performing an evaluation and management service by telephone and the availability of in person appointments. I also discussed with the patient that there may be a patient responsible charge related to this service. The patient expressed understanding and agreed to proceed.   Location of Patient: Home  Location of Provider: Clinic   Persons participating in Telemedicine visit: Darrell [name] - spouse Darrell [name] - CMA Dr Margarita Rana - PCP    History of Present Illness: Darrell [name] is a 68 year old male with a history of hypertension, previous CVA with residual left-sided weakness, seizure in the setting of prior CVA, second degree AV block (status post medtronic dual chamber pacemaker) stage III CKD, motor neuron disease versus ALS who is seen today for follow-up visit.  He complains of a 38-month history of urinary frequency and states he is unable to hold his urine.  Denies burning, abdominal pain, fever and symptoms occur both during the day and at nighttime.  He also endorses constipation and moves his bowels about 2 times a week.  OTC laxatives have been ineffective. He has noticed decreased energy and is on Drisdol due to vitamin D deficiency.  Denies dyspnea, chest pains.  His stiffness has worsened as he can only take a few steps and struggles to balance.  While attempting to get him up his wife says he has fallen a couple of occasions but this has happened when they are close to the bed.  He does not have any breathing difficulties but does have some dysphagia. He is yet to see his neurologist at [facility] and currently does not undergo physical therapy only  every 3 months when he goes to [facility]. Denies recent seizures and was last seen by the neurology nurse practitioner at [facility] on 01/23/2019. The pressure ulcer he previously had at his last office visit has resolved.  He is closely followed by his nephrologist at [facility]. Spouse is requesting a referral for colonoscopy as he has never had one.  Past Medical History:  Diagnosis Date  . Arthritis   . At high risk for falls 08/16/2015  . Bradycardia   . Chronic kidney disease    stage 3 GFR 30-59 ml/min   . CIDP (chronic inflammatory demyelinating polyneuropathy) ([place])   . CKD (chronic kidney disease) stage 3, GFR 30-59 ml/min ([name]) 08/16/2015  . Dysphagia as late effect of cerebrovascular disease    pts wife states pt has to eat soft foods   . Elevated liver enzymes 08/10/2016  . GERD (gastroesophageal reflux disease)   . Glaucoma   . High cholesterol   . History of CVA with residual deficit 03/25/2013  . Hypertension   . Hypertensive retinopathy of both eyes 01/16/2017  . Inguinal hernia 03/25/2013  . Liver hemangioma 08/14/2016  . Neuromuscular disorder ([place])    chronic inflammatory demyelinating polyneuropathy   . New onset seizure ([place]) 07/08/2017   seizure 07/14/18  . Nuclear sclerosis of both eyes 10/25/2016  . Presence of permanent cardiac pacemaker    placed in april 2018  . Primary open angle glaucoma of both eyes, indeterminate stage 10/25/2016  . Renal mass, right 08/14/2016  . Status  cardiac pacemaker 01/29/2017   Placed for second degree heart block on 01/16/17 Medtronic Azure XT DR MRI SureScan dual-chamber pacemaker  . Stroke [facility])    2011 with residual deficit left sided weakness  . Tobacco dependence    Allergies  Allergen Reactions  . Ace Inhibitors Other (See Comments)    Hyperkalemia  . Doxycycline Other (See Comments)    Hiccups, cough, nausea and emesis, elevated liver enzymes, elevated eosinophils, SOB concerning  for early DRESS syndrome   . Atacand Hct [Candesartan Cilexetil-Hctz] Hives  . Shellfish Allergy Hives    Current Outpatient Medications on File Prior to Visit  Medication Sig Dispense Refill  . aspirin EC 81 MG tablet Take 1 tablet (81 mg total) by mouth daily. 90 tablet 3  . atorvastatin (LIPITOR) 40 MG tablet Take 1 tablet (40 mg total) by mouth daily. 90 tablet 0  . bacitracin 500 UNIT/GM ointment Apply 1 application topically 2 (two) times daily. 28 g 2  . brimonidine (ALPHAGAN P) 0.1 % SOLN Place 1 drop into both eyes daily.     . cetirizine (ZYRTEC) 10 MG tablet Take 1 tablet (10 mg total) by mouth daily. 30 tablet 1  . divalproex (DEPAKOTE) 500 MG DR tablet Take 500 mg by mouth 2 (two) times daily.    . dorzolamide-timolol (COSOPT) 22.3-6.8 MG/ML ophthalmic solution Place 1 drop into both eyes daily.     . fluticasone (FLONASE) 50 MCG/ACT nasal spray Place 2 sprays into both nostrils daily. 16 g 6  . folic acid (FOLVITE) 1 MG tablet Take 1 tablet (1 mg total) by mouth daily. 90 tablet 1  . hydrALAZINE (APRESOLINE) 100 MG tablet Take 1 tablet (100 mg total) by mouth 3 (three) times daily. 270 tablet 1  . ketoconazole (NIZORAL) 2 % cream Apply 1 application topically daily as needed for irritation.    Marland Kitchen latanoprost (XALATAN) 0.005 % ophthalmic solution Place 1 drop into both eyes at bedtime.    . levETIRAcetam (KEPPRA) 500 MG tablet Take 1 tablet (500 mg total) by mouth 2 (two) times daily. 180 tablet 4  . lisinopril (PRINIVIL,ZESTRIL) 10 MG tablet Take 1 tablet (10 mg total) by mouth daily. 90 tablet 1  . metoprolol tartrate (LOPRESSOR) 50 MG tablet Take 1 tablet by mouth twice daily 60 tablet 3  . Misc. Devices MISC Donut cushion. Dx- Decubitus ulcer of Sacrum 1 each 0  . Multiple Vitamins-Iron (DAILY VITAMIN FORMULA+IRON) TABS Take 1 tablet by mouth daily.    . polyethylene glycol (MIRALAX / GLYCOLAX) packet Take 17 g by mouth daily as needed for mild constipation. 30 each 3  .  Sennosides-Docusate Sodium (SENNA S PO) Take 1 tablet by mouth daily as needed (constipation).     . terbinafine (LAMISIL AT) 1 % cream Apply 1 application topically 2 (two) times daily. To R foot 30 g 0  . thiamine 100 MG tablet Take 1 tablet (100 mg total) by mouth daily. 30 tablet 0  . torsemide (DEMADEX) 20 MG tablet Take 20 mg by mouth daily.    . Vitamin D, Ergocalciferol, (DRISDOL) 1.25 MG (50000 UT) CAPS capsule Take 1 capsule by mouth once a week 4 capsule 0   No current facility-administered medications on file prior to visit.     Observations/Objective: Awake, alert, oriented x3 Not in acute distress  Assessment and Plan: 1. Essential hypertension Stable Counseled on blood pressure goal of less than 130/80, low-sodium, DASH diet, medication compliance, 150 minutes of moderate intensity exercise per  week. Discussed medication compliance, adverse effects. - lisinopril (ZESTRIL) 10 MG tablet; Take 1 tablet (10 mg total) by mouth daily.  Dispense: 90 tablet; Refill: 1 - hydrALAZINE (APRESOLINE) 100 MG tablet; Take 1 tablet (100 mg total) by mouth 3 (three) times daily.  Dispense: 270 tablet; Refill: 1  2. Hemiparesis affecting left side as late effect of stroke ([name]) Risk factor modification High risk for falls-fall precautions - atorvastatin (LIPITOR) 40 MG tablet; Take 1 tablet (40 mg total) by mouth daily.  Dispense: 90 tablet; Refill: 1  3. Screening for colon cancer Due to his multiple comorbidities he may not be a candidate Will refer as per request - Ambulatory referral to Gastroenterology  4. CKD (chronic kidney disease) stage 3, GFR 30-59 ml/min ([name]) Likely hypertensive nephropathy Followed by [facility]  5. Other constipation Advised to increase fiber intake Placed on lactulose  6. 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[place]) Progressive with ongoing spasticity Follow-up with neurology and PT He might benefit from frequent more PT sessions  7. Lower  urinary tract symptoms Trial of Flomax If symptoms persist will bring him in to order urine analysis and recheck glucose Low suspicion for diabetes   Follow Up Instructions: 3 months   I discussed the assessment and treatment plan with the patient. The patient was provided an opportunity to ask questions and all were answered. The patient agreed with the plan and demonstrated an understanding of the instructions.   The patient was advised to call back or seek an in-person evaluation if the symptoms worsen or if the condition fails to improve as anticipated.     I provided 22 minutes total of non-face-to-face time during this encounter including median intraservice time, reviewing previous notes, labs, imaging, medications, management and patient verbalized understanding.     [name], MD, FAAFP. [facility] [place], [place]   02/25/2019, 9:09 AM

## 2019-02-25 NOTE — Patient Instructions (Signed)

## 2019-02-25 NOTE — Progress Notes (Signed)
Patient has been called and DOB has been verified. Patient has been screened and transferred to PCP to start phone visit.  Refills:  All medications   Patient is going to the bathroom more frequently.  Patient's wife is concerned about energy levels.

## 2019-03-03 ENCOUNTER — Telehealth: Payer: Self-pay | Admitting: Family Medicine

## 2019-03-03 NOTE — Telephone Encounter (Signed)
Patient wife called stating that patient was prescribed hydrALAZINE (APRESOLINE) 100 MG tablet but patient wife states he used to take 25 mg 3 times a day. Patients wife would also like to know what he was prescribed flomax. Please f/u

## 2019-03-05 NOTE — Telephone Encounter (Signed)
Patient's wife wants to know when the best time he should take the Flomax.

## 2019-03-05 NOTE — Telephone Encounter (Signed)
For urinary frequency which can occur as a result of enlarged prostate.  Take it in the morning

## 2019-03-05 NOTE — Telephone Encounter (Signed)
Patient's wife was called and informed of the need for Flomax for the patient. Hydralazine questions have been answered by cardiologist.

## 2019-03-14 NOTE — Telephone Encounter (Deleted)
Pt has been scheduled for 04-24-2019 @ 1030 am.

## 2019-03-17 ENCOUNTER — Ambulatory Visit (INDEPENDENT_AMBULATORY_CARE_PROVIDER_SITE_OTHER): Payer: Medicare HMO | Admitting: Gastroenterology

## 2019-03-17 ENCOUNTER — Encounter: Payer: Self-pay | Admitting: Gastroenterology

## 2019-03-17 VITALS — Ht 74.0 in | Wt 150.0 lb

## 2019-03-17 DIAGNOSIS — Z1211 Encounter for screening for malignant neoplasm of colon: Secondary | ICD-10-CM

## 2019-03-17 DIAGNOSIS — K5909 Other constipation: Secondary | ICD-10-CM | POA: Diagnosis not present

## 2019-03-17 NOTE — Progress Notes (Signed)
This patient contacted our office requesting a physician telemedicine consultation regarding clinical questions and/or test results. Interactive audio and video telecommunications were attempted between this provider and the patient.  However, this technology failed due to the patient having technical difficulties OR they did not have access to video capabilities.  We continued and completed the visit with audio only.  If new patient, they were referred by PCP noted below.  Participants on the conference : myself and patient   The patient consented to this consultation and was aware that a charge will be placed through their insurance.  I was in my office and the patient was at home   Encounter time:  Total time 30 minutes,24 spent with patient/wife on telephone (extensive chart review done)  _____________________________________________________________________________________________              [facility] Consult Note:  History: OCTAVIS [name] 03/17/2019  Referring provider: [name], MD  Reason for consult/chief complaint: Constipation (discuss colonoscopy ) and colon cancer screening  Subjective  HPI: Patient was tentatively scheduled for screening colonoscopy with Dr. Rush Landmark last December, but change to office visit given his multiple complex medical issues and poor mobility.  That visit does not appear to have occurred, therefore this is new to our practice.  From his 02/26/19 [facility] consult note (Dr/ Gustavus Bryant):  "HISTORY: Mr. SHELL YANDOW is a 68 y.Darrell. male with complicated presentation. He had history of heavy alcohol use though has not consumed since 2015, left-sided weakness which was diagnosed as a stroke in 2011, then began seeing a neurologist in 2015 for slowly progressive weakness and dysphagia which started around mid 2013 according to his wife. He has continued to decline, becoming progressively dependent on his  wife. Since 2018, he has had particularly worsened dysphagia and mobility. In late 2018, he had new onset seizures which had been well controlled on low dose VPA until recently.  CURRENTLY: Patient presents with wife for 3 month follow-up. His wife believes he is slightly weaker all over and has more difficulty using his walker for transfers. Swallowing/Cough: They currently deny episodes of dysphagia/choking, however he previously did have these.  Weight loss: They think his weight is about stable at 175lb. Prior to this he did have significant weight loss.  Saliva: no excess production, normal  Breathing: None. Wife states he snores.  UE weakness: Worse in left than right arm (left arm weakness due to stroke)  Eating: difficulty because no bottom teeth. They try to make sure everything is chopped up and/or soft. Due to arm weakness wife has to help him sometimes. LE weakness: Bilateral weakness, difficulty walking and standing. Uses walker primarily for transfers.  Falls: No falls, except back onto his bed when making transfers. Walking: Not able to walk on his own  They have received bedside commode, but not semi-electric hospital bed, hoyer lift with sling, or SAD/suction devices which were recommended after his last visit.   No recent seizures since his last visit. On keppra 500mg  BID. "   Suspected Dx is Primary lateral Sclerosis   02/25/19 PCP note also reviewed, outlining the multiple medical issues.  April 2020 note from local neurology practice also reviewed.  ________________________________________________________________ No rectal bleeding  No family history colon/rectal cancer  Constipation for nearly a year.Denies abdominal pain.   ROS:  Review of Systems  (largely obtained from wife - patient limited verbally) Generalized weakness Dysphagia Dysarthria Progressive left sided weakness  Over last year, all  neuro symptoms worsened, and can only walk a couple steps  with assistance Years of dysphagia, food needs to be chopped   Past Medical History: Past Medical History:  Diagnosis Date  . Arthritis   . At high risk for falls 08/16/2015  . Bradycardia   . Chronic kidney disease    stage 3 GFR 30-59 ml/min   . CIDP (chronic inflammatory demyelinating polyneuropathy) ([place])   . CKD (chronic kidney disease) stage 3, GFR 30-59 ml/min ([name]) 08/16/2015  . Dysphagia as late effect of cerebrovascular disease    pts wife states pt has to eat soft foods   . Elevated liver enzymes 08/10/2016  . GERD (gastroesophageal reflux disease)   . Glaucoma   . High cholesterol   . History of CVA with residual deficit 03/25/2013  . Hypertension   . Hypertensive retinopathy of both eyes 01/16/2017  . Inguinal hernia 03/25/2013  . Liver hemangioma 08/14/2016  . Neuromuscular disorder ([place])    chronic inflammatory demyelinating polyneuropathy   . New onset seizure ([place]) 07/08/2017   seizure 07/14/18  . Nuclear sclerosis of both eyes 10/25/2016  . Presence of permanent cardiac pacemaker    placed in april 2018  . Primary open angle glaucoma of both eyes, indeterminate stage 10/25/2016  . Renal mass, right 08/14/2016  . Status cardiac pacemaker 01/29/2017   Placed for second degree heart block on 01/16/17 Medtronic Azure XT DR MRI SureScan dual-chamber pacemaker  . Stroke [facility])    2011 with residual deficit left sided weakness  . Tobacco dependence      Past Surgical History: Past Surgical History:  Procedure Laterality Date  . EYE SURGERY    . HERNIA REPAIR    . INGUINAL HERNIA REPAIR Right 11/23/2014   Procedure: right inguinal hernia repair with mesh;  Surgeon: Armandina Gemma, MD;  Location: [facility];  Service: General;  Laterality: Right;  . INSERTION OF MESH N/A 11/23/2014   Procedure: INSERTION OF MESH;  Surgeon: Armandina Gemma, MD;  Location: [facility];  Service: General;  Laterality: N/A;  . MASS EXCISION Left 08/29/2017   Procedure: EXCISION OF LEFT NECK MASS;  Surgeon:  Coralie Keens, MD;  Location: [place];  Service: General;  Laterality: Left;  . PACEMAKER IMPLANT N/A 01/16/2017   Procedure: Pacemaker Implant;  Surgeon: Will Meredith Leeds, MD;  Location: [place] [facility];  Service: Cardiovascular;  Laterality: N/A;  . SHOULDER SURGERY Bilateral 1988, 1998     Family History: Family History  Problem Relation Age of Onset  . Hypertension Mother   . Diabetes Sister   . Hypertension Sister   . Cancer Sister        1 sister  . COPD Sister        in 1 sister  . Stomach cancer Neg Hx   . Colon cancer Neg Hx   . Pancreatic cancer Neg Hx   . Esophageal cancer Neg Hx     Social History: Social History   Socioeconomic History  . Marital status: Married    Spouse name: [name]  . Number of children: 1  . Years of education: college  . Highest education level: Not on file  Occupational History  . Occupation: retired  Scientific laboratory technician  . Financial resource strain: Not on file  . Food insecurity    Worry: Not on file    Inability: Not on file  . Transportation needs    Medical: Not on file    Non-medical: Not on file  Tobacco Use  .  Smoking status: Current Every Day Smoker    Packs/day: 1.00    Years: 40.00    Pack years: 40.00    Types: Cigarettes  . Smokeless tobacco: Never Used  . Tobacco comment: 09/13/17 little < 1 PPD  Substance and Sexual Activity  . Alcohol use: No    Comment: alcohol free for 1 year was drinking 1/2 pint per day   . Drug use: No  . Sexual activity: Not on file  Lifestyle  . Physical activity    Days per week: Not on file    Minutes per session: Not on file  . Stress: Not on file  Relationships  . Social Herbalist on phone: Not on file    Gets together: Not on file    Attends religious service: Not on file    Active member of club or organization: Not on file    Attends meetings of clubs or organizations: Not on file    Relationship status: Not on file  Other Topics Concern  . Not on file   Social History Narrative   09/13/17 Patient lives at home with spouse.   Has 18 yo daughter   No grandchildren    Allergies: Allergies  Allergen Reactions  . Ace Inhibitors Other (See Comments)    Hyperkalemia  . Doxycycline Other (See Comments)    Hiccups, cough, nausea and emesis, elevated liver enzymes, elevated eosinophils, SOB concerning for early DRESS syndrome   . Atacand Hct [Candesartan Cilexetil-Hctz] Hives  . Shellfish Allergy Hives    Outpatient Meds: Current Outpatient Medications  Medication Sig Dispense Refill  . aspirin EC 81 MG tablet Take 1 tablet (81 mg total) by mouth daily. 90 tablet 3  . atorvastatin (LIPITOR) 40 MG tablet Take 1 tablet (40 mg total) by mouth daily. 90 tablet 1  . bacitracin 500 UNIT/GM ointment Apply 1 application topically 2 (two) times daily. 28 g 2  . brimonidine (ALPHAGAN P) 0.1 % SOLN Place 1 drop into both eyes daily.     . cetirizine (ZYRTEC) 10 MG tablet Take 1 tablet (10 mg total) by mouth daily. 30 tablet 1  . divalproex (DEPAKOTE) 500 MG DR tablet Take 500 mg by mouth 2 (two) times daily.    . dorzolamide-timolol (COSOPT) 22.3-6.8 MG/ML ophthalmic solution Place 1 drop into both eyes daily.     . fluticasone (FLONASE) 50 MCG/ACT nasal spray Place 2 sprays into both nostrils daily. 16 g 6  . folic acid (FOLVITE) 1 MG tablet Take 1 tablet (1 mg total) by mouth daily. 90 tablet 1  . hydrALAZINE (APRESOLINE) 100 MG tablet Take 1 tablet (100 mg total) by mouth 3 (three) times daily. 270 tablet 1  . ketoconazole (NIZORAL) 2 % cream Apply 1 application topically daily as needed for irritation.    Marland Kitchen lactulose (CHRONULAC) 10 GM/15ML solution Take 15 mLs (10 g total) by mouth daily. 946 mL 1  . latanoprost (XALATAN) 0.005 % ophthalmic solution Place 1 drop into both eyes at bedtime.    . levETIRAcetam (KEPPRA) 500 MG tablet Take 1 tablet (500 mg total) by mouth 2 (two) times daily. 180 tablet 4  . lisinopril (ZESTRIL) 10 MG tablet Take 1  tablet (10 mg total) by mouth daily. 90 tablet 1  . metoprolol tartrate (LOPRESSOR) 50 MG tablet Take 1 tablet (50 mg total) by mouth 2 (two) times daily. 60 tablet 6  . Misc. Devices MISC Donut cushion. Dx- Decubitus ulcer of Sacrum 1 each  0  . Multiple Vitamins-Iron (DAILY VITAMIN FORMULA+IRON) TABS Take 1 tablet by mouth daily.    . polyethylene glycol (MIRALAX / GLYCOLAX) packet Take 17 g by mouth daily as needed for mild constipation. 30 each 3  . Sennosides-Docusate Sodium (SENNA S PO) Take 1 tablet by mouth daily as needed (constipation).     . tamsulosin (FLOMAX) 0.4 MG CAPS capsule Take 1 capsule (0.4 mg total) by mouth daily. 30 capsule 3  . terbinafine (LAMISIL AT) 1 % cream Apply 1 application topically 2 (two) times daily. To R foot 30 g 0  . thiamine 100 MG tablet Take 1 tablet (100 mg total) by mouth daily. 30 tablet 0  . torsemide (DEMADEX) 20 MG tablet Take 20 mg by mouth daily.    . Vitamin D, Ergocalciferol, (DRISDOL) 1.25 MG (50000 UT) CAPS capsule Take 1 capsule by mouth once a week 4 capsule 0   No current facility-administered medications for this visit.     miralax was not helpful, so recently prescribed lactulose 15ml daily  - has improved from one BM/wk to two BM/wk  ___________________________________________________________________ Objective   Exam:   No exam - telephone visit  Labs:  CBC Latest Ref Rng & Units 10/29/2018 07/14/2018 06/13/2018  WBC 4.0 - 10.5 K/uL 8.3 14.9(H) 7.1  Hemoglobin 13.0 - 17.0 g/dL 10.8(L) 12.6(L) 12.7(L)  Hematocrit 39.0 - 52.0 % 33.6(L) 39.8 38.1  Platelets 150 - 400 K/uL 163 266 321   CMP Latest Ref Rng & Units 10/29/2018 07/14/2018 06/13/2018  Glucose 70 - 99 mg/dL 106(H) 125(H) 84  BUN 8 - 23 mg/dL 43(H) 26(H) 19  Creatinine 0.61 - 1.24 mg/dL 2.48(H) 2.09(H) 1.78(H)  Sodium 135 - 145 mmol/L 138 141 143  Potassium 3.5 - 5.1 mmol/L 4.7 4.3 4.4  Chloride 98 - 111 mmol/L 100 105 103  CO2 22 - 32 mmol/L 31 27 26   Calcium 8.9 -  10.3 mg/dL 9.7 10.3 10.8(H)  Total Protein 6.5 - 8.1 g/dL 5.8(L) - 6.6  Total Bilirubin 0.3 - 1.2 mg/dL 0.9 - 0.4  Alkaline Phos 38 - 126 U/L 49 - 92  AST 15 - 41 U/L 34 - 8  ALT 0 - 44 U/L 23 - 6     Assessment: Encounter Diagnosis  Name Primary?  . Chronic constipation Yes   Advised increase lactulose to twice daily, follow up with primary care re: dosing and monitor effect.  Risks >> benefits of any colon cancer screening.  Progressive neurologic condition, other serious medical conditions.    They seem comfortable with that advice, and were appreciative of the time and explanation.  Thank you for the courtesy of this consult.  Please call me with any questions or concerns.  Nelida Meuse III  CC: Referring provider noted above

## 2019-03-19 ENCOUNTER — Other Ambulatory Visit: Payer: Self-pay | Admitting: Family Medicine

## 2019-03-24 ENCOUNTER — Ambulatory Visit (INDEPENDENT_AMBULATORY_CARE_PROVIDER_SITE_OTHER): Payer: Medicare HMO | Admitting: *Deleted

## 2019-03-24 DIAGNOSIS — I459 Conduction disorder, unspecified: Secondary | ICD-10-CM

## 2019-03-24 LAB — CUP PACEART REMOTE DEVICE CHECK
Battery Remaining Longevity: 117 mo
Battery Voltage: 3.01 V
Brady Statistic AP VP Percent: 28.34 %
Brady Statistic AP VS Percent: 53.31 %
Brady Statistic AS VP Percent: 0.2 %
Brady Statistic AS VS Percent: 18.15 %
Brady Statistic RA Percent Paced: 81.84 %
Brady Statistic RV Percent Paced: 28.54 %
Date Time Interrogation Session: 20200627165013
Implantable Lead Implant Date: 20180424
Implantable Lead Implant Date: 20180424
Implantable Lead Location: 753859
Implantable Lead Location: 753860
Implantable Lead Model: 5076
Implantable Lead Model: 5076
Implantable Pulse Generator Implant Date: 20180424
Lead Channel Impedance Value: 304 Ohm
Lead Channel Impedance Value: 342 Ohm
Lead Channel Impedance Value: 399 Ohm
Lead Channel Impedance Value: 399 Ohm
Lead Channel Pacing Threshold Amplitude: 0.5 V
Lead Channel Pacing Threshold Amplitude: 0.625 V
Lead Channel Pacing Threshold Pulse Width: 0.4 ms
Lead Channel Pacing Threshold Pulse Width: 0.4 ms
Lead Channel Sensing Intrinsic Amplitude: 0.75 mV
Lead Channel Sensing Intrinsic Amplitude: 0.75 mV
Lead Channel Sensing Intrinsic Amplitude: 7.75 mV
Lead Channel Sensing Intrinsic Amplitude: 7.75 mV
Lead Channel Setting Pacing Amplitude: 1.5 V
Lead Channel Setting Pacing Amplitude: 2.5 V
Lead Channel Setting Pacing Pulse Width: 0.4 ms
Lead Channel Setting Sensing Sensitivity: 2 mV

## 2019-03-25 ENCOUNTER — Encounter: Payer: Self-pay | Admitting: Podiatry

## 2019-03-25 ENCOUNTER — Ambulatory Visit: Payer: Medicare HMO | Admitting: Podiatry

## 2019-03-25 ENCOUNTER — Ambulatory Visit: Payer: Medicare HMO | Admitting: Diagnostic Neuroimaging

## 2019-03-25 ENCOUNTER — Other Ambulatory Visit: Payer: Self-pay

## 2019-03-25 DIAGNOSIS — M79674 Pain in right toe(s): Secondary | ICD-10-CM | POA: Diagnosis not present

## 2019-03-25 DIAGNOSIS — M79675 Pain in left toe(s): Secondary | ICD-10-CM

## 2019-03-25 DIAGNOSIS — B351 Tinea unguium: Secondary | ICD-10-CM

## 2019-03-25 NOTE — Progress Notes (Signed)
Complaint:  Visit Type: Patient presents  to my office for  preventative foot care services. Complaint: Patient states" my nails have grown long and thick and become painful to walk and wear shoes"  The patient presents for preventative foot care services. Patient is blind and has history of CVA.  He presents to the office in a wheelchair with his wife.    Podiatric Exam: Vascular: dorsalis pedis and posterior tibial pulses are palpable right.  Dorsalis pedis and posterior tibial pulses are absent left foot.  Capillary return is immediate. Temperature gradient is WNL. Skin turgor WNL  Sensorium: Normal Semmes Weinstein monofilament test. Normal tactile sensation bilaterally. Nail Exam: Pt has thick disfigured discolored nails with subungual debris noted bilateral entire nail hallux through fifth toenails Ulcer Exam: There is no evidence of ulcer or pre-ulcerative changes or infection. Orthopedic Exam: . No limitations in general ROM. No crepitus or effusions noted. Foot type and digits show no abnormalities. Bony prominences are unremarkable. Skin: No Porokeratosis. No infection or ulcers  Diagnosis:  Onychomycosis, , Pain in right toe, pain in left toes  Treatment & Plan Procedures and Treatment: Consent by patient was obtained for treatment procedures.   Debridement of mycotic and hypertrophic toenails, 1 through 5 bilateral and clearing of subungual debris. No ulceration, no infection noted.  Return Visit-Office Procedure: Patient instructed to return to the office for a follow up visit 3 months for continued evaluation and treatment.    Gardiner Barefoot DPM

## 2019-03-27 ENCOUNTER — Telehealth: Payer: Self-pay | Admitting: *Deleted

## 2019-03-27 MED ORDER — METOPROLOL TARTRATE 100 MG PO TABS: 100.0000 mg | ORAL_TABLET | Freq: Two times a day (BID) | ORAL | 3 refills | Status: DC

## 2019-03-27 NOTE — Telephone Encounter (Signed)
-----   Message from Will Meredith Leeds, MD sent at 03/26/2019  3:00 PM EDT ----- Abnormal device interrogation reviewed.  Lead parameters and battery status stable.  NSVT noted on device. Increase metoprolol to 100 mg BID.

## 2019-03-27 NOTE — Telephone Encounter (Signed)
Pt and his wife have been notified of device check results and recommendations. I went over recommendations per Dr. Curt Bears to increase Metoprolol Tart to 100 mg BID as NSVT was noted on device check. Both the pt and his wife are agreeable to plan of care and verbalized understanding. New Rx has been sent in and med listed has been updated.

## 2019-04-01 NOTE — Progress Notes (Signed)
Remote pacemaker transmission.   

## 2019-04-04 ENCOUNTER — Telehealth: Payer: Self-pay | Admitting: Family Medicine

## 2019-04-04 NOTE — Telephone Encounter (Signed)
Patient was called and informed that medication increase was done by dr Margarita Rana on last televist.

## 2019-04-04 NOTE — Telephone Encounter (Signed)
hydrALAZINE (APRESOLINE) 25 MG tablet  Patient wife called stating that this medication was increased from 25mg  3 times a day to 100 mg 3 times a day and would like to know if this is the dosage he should be taking. Please follow up.

## 2019-04-22 ENCOUNTER — Other Ambulatory Visit: Payer: Self-pay | Admitting: Family Medicine

## 2019-04-23 ENCOUNTER — Telehealth: Payer: Self-pay

## 2019-04-23 NOTE — Telephone Encounter (Signed)
LMOVM with my direct office number. Pt do not need to send manual transmissions.

## 2019-04-24 NOTE — Telephone Encounter (Signed)
I spoke with the pt wife and let her know that the pt did not have to send manual transmissions. I told her his monitor is programmed to send by itself. Pt wife states that the monitor will light up and show to push the button. She thought she had to do the steps when the monitor tell her to. I told her to ignore the monitor when it does that. The only time the pt has to send is when the nurse call and ask the pt to send a transmission. The pt wife verbalized understanding and thanked me for the call.

## 2019-05-05 ENCOUNTER — Emergency Department (HOSPITAL_COMMUNITY): Payer: Medicare HMO

## 2019-05-05 ENCOUNTER — Other Ambulatory Visit: Payer: Self-pay

## 2019-05-05 ENCOUNTER — Inpatient Hospital Stay (HOSPITAL_COMMUNITY)
Admission: EM | Admit: 2019-05-05 | Discharge: 2019-05-08 | DRG: 682 | Disposition: A | Discharge status: Hospice | Payer: Medicare HMO | Attending: Internal Medicine | Admitting: Internal Medicine

## 2019-05-05 ENCOUNTER — Encounter (HOSPITAL_COMMUNITY): Payer: Self-pay

## 2019-05-05 DIAGNOSIS — K219 Gastro-esophageal reflux disease without esophagitis: Secondary | ICD-10-CM | POA: Diagnosis present

## 2019-05-05 DIAGNOSIS — N2889 Other specified disorders of kidney and ureter: Secondary | ICD-10-CM | POA: Diagnosis not present

## 2019-05-05 DIAGNOSIS — Z825 Family history of asthma and other chronic lower respiratory diseases: Secondary | ICD-10-CM

## 2019-05-05 DIAGNOSIS — Z91013 Allergy to seafood: Secondary | ICD-10-CM

## 2019-05-05 DIAGNOSIS — I129 Hypertensive chronic kidney disease with stage 1 through stage 4 chronic kidney disease, or unspecified chronic kidney disease: Secondary | ICD-10-CM | POA: Diagnosis present

## 2019-05-05 DIAGNOSIS — G1220 Motor neuron disease, unspecified: Secondary | ICD-10-CM | POA: Diagnosis present

## 2019-05-05 DIAGNOSIS — Z20828 Contact with and (suspected) exposure to other viral communicable diseases: Secondary | ICD-10-CM | POA: Diagnosis not present

## 2019-05-05 DIAGNOSIS — Z209 Contact with and (suspected) exposure to unspecified communicable disease: Secondary | ICD-10-CM | POA: Diagnosis not present

## 2019-05-05 DIAGNOSIS — N179 Acute kidney failure, unspecified: Principal | ICD-10-CM | POA: Diagnosis present

## 2019-05-05 DIAGNOSIS — R4781 Slurred speech: Secondary | ICD-10-CM | POA: Diagnosis present

## 2019-05-05 DIAGNOSIS — E7800 Pure hypercholesterolemia, unspecified: Secondary | ICD-10-CM | POA: Diagnosis present

## 2019-05-05 DIAGNOSIS — R05 Cough: Secondary | ICD-10-CM

## 2019-05-05 DIAGNOSIS — I441 Atrioventricular block, second degree: Secondary | ICD-10-CM | POA: Diagnosis present

## 2019-05-05 DIAGNOSIS — F17210 Nicotine dependence, cigarettes, uncomplicated: Secondary | ICD-10-CM | POA: Diagnosis present

## 2019-05-05 DIAGNOSIS — Z79899 Other long term (current) drug therapy: Secondary | ICD-10-CM

## 2019-05-05 DIAGNOSIS — Z66 Do not resuscitate: Secondary | ICD-10-CM | POA: Diagnosis present

## 2019-05-05 DIAGNOSIS — Z8249 Family history of ischemic heart disease and other diseases of the circulatory system: Secondary | ICD-10-CM

## 2019-05-05 DIAGNOSIS — Z888 Allergy status to other drugs, medicaments and biological substances status: Secondary | ICD-10-CM

## 2019-05-05 DIAGNOSIS — K5900 Constipation, unspecified: Secondary | ICD-10-CM | POA: Diagnosis not present

## 2019-05-05 DIAGNOSIS — Z7982 Long term (current) use of aspirin: Secondary | ICD-10-CM

## 2019-05-05 DIAGNOSIS — I10 Essential (primary) hypertension: Secondary | ICD-10-CM | POA: Diagnosis present

## 2019-05-05 DIAGNOSIS — H401134 Primary open-angle glaucoma, bilateral, indeterminate stage: Secondary | ICD-10-CM | POA: Diagnosis present

## 2019-05-05 DIAGNOSIS — H409 Unspecified glaucoma: Secondary | ICD-10-CM | POA: Diagnosis present

## 2019-05-05 DIAGNOSIS — Z833 Family history of diabetes mellitus: Secondary | ICD-10-CM

## 2019-05-05 DIAGNOSIS — Z993 Dependence on wheelchair: Secondary | ICD-10-CM

## 2019-05-05 DIAGNOSIS — R1310 Dysphagia, unspecified: Secondary | ICD-10-CM | POA: Diagnosis present

## 2019-05-05 DIAGNOSIS — Z7189 Other specified counseling: Secondary | ICD-10-CM

## 2019-05-05 DIAGNOSIS — I69354 Hemiplegia and hemiparesis following cerebral infarction affecting left non-dominant side: Secondary | ICD-10-CM

## 2019-05-05 DIAGNOSIS — G6181 Chronic inflammatory demyelinating polyneuritis: Secondary | ICD-10-CM | POA: Diagnosis present

## 2019-05-05 DIAGNOSIS — N183 Chronic kidney disease, stage 3 (moderate): Secondary | ICD-10-CM | POA: Diagnosis present

## 2019-05-05 DIAGNOSIS — D631 Anemia in chronic kidney disease: Secondary | ICD-10-CM | POA: Diagnosis present

## 2019-05-05 DIAGNOSIS — G1221 Amyotrophic lateral sclerosis: Secondary | ICD-10-CM | POA: Diagnosis present

## 2019-05-05 DIAGNOSIS — Z809 Family history of malignant neoplasm, unspecified: Secondary | ICD-10-CM

## 2019-05-05 DIAGNOSIS — Z9181 History of falling: Secondary | ICD-10-CM

## 2019-05-05 DIAGNOSIS — Z881 Allergy status to other antibiotic agents status: Secondary | ICD-10-CM

## 2019-05-05 DIAGNOSIS — Z950 Presence of cardiac pacemaker: Secondary | ICD-10-CM

## 2019-05-05 DIAGNOSIS — J189 Pneumonia, unspecified organism: Secondary | ICD-10-CM | POA: Diagnosis present

## 2019-05-05 DIAGNOSIS — Z515 Encounter for palliative care: Secondary | ICD-10-CM

## 2019-05-05 DIAGNOSIS — R569 Unspecified convulsions: Secondary | ICD-10-CM

## 2019-05-05 DIAGNOSIS — N184 Chronic kidney disease, stage 4 (severe): Secondary | ICD-10-CM | POA: Diagnosis present

## 2019-05-05 DIAGNOSIS — N189 Chronic kidney disease, unspecified: Secondary | ICD-10-CM | POA: Diagnosis present

## 2019-05-05 DIAGNOSIS — N390 Urinary tract infection, site not specified: Secondary | ICD-10-CM | POA: Diagnosis present

## 2019-05-05 DIAGNOSIS — M1990 Unspecified osteoarthritis, unspecified site: Secondary | ICD-10-CM | POA: Diagnosis present

## 2019-05-05 DIAGNOSIS — K5710 Diverticulosis of small intestine without perforation or abscess without bleeding: Secondary | ICD-10-CM | POA: Diagnosis not present

## 2019-05-05 DIAGNOSIS — R059 Cough, unspecified: Secondary | ICD-10-CM

## 2019-05-05 DIAGNOSIS — G40909 Epilepsy, unspecified, not intractable, without status epilepticus: Secondary | ICD-10-CM

## 2019-05-05 LAB — TROPONIN I (HIGH SENSITIVITY)
Troponin I (High Sensitivity): 9 ng/L (ref <18)
Troponin I (High Sensitivity): 9 ng/L (ref <18)

## 2019-05-05 LAB — URINALYSIS, COMPLETE (UACMP) WITH MICROSCOPIC
Bilirubin Urine: NEGATIVE
Glucose, UA: NEGATIVE mg/dL
Hgb urine dipstick: NEGATIVE
Ketones, ur: NEGATIVE mg/dL
Nitrite: NEGATIVE
Protein, ur: NEGATIVE mg/dL
Specific Gravity, Urine: 1.011 (ref 1.005–1.030)
pH: 5 (ref 5.0–8.0)

## 2019-05-05 LAB — HEPATIC FUNCTION PANEL
ALT: 9 U/L (ref 0–44)
AST: 14 U/L — ABNORMAL LOW (ref 15–41)
Albumin: 4.2 g/dL (ref 3.5–5.0)
Alkaline Phosphatase: 78 U/L (ref 38–126)
Bilirubin, Direct: 0.1 mg/dL (ref 0.0–0.2)
Indirect Bilirubin: 0.4 mg/dL (ref 0.3–0.9)
Total Bilirubin: 0.5 mg/dL (ref 0.3–1.2)
Total Protein: 7.8 g/dL (ref 6.5–8.1)

## 2019-05-05 LAB — BASIC METABOLIC PANEL
Anion gap: 11 (ref 5–15)
BUN: 78 mg/dL — ABNORMAL HIGH (ref 8–23)
CO2: 23 mmol/L (ref 22–32)
Calcium: 10.3 mg/dL (ref 8.9–10.3)
Chloride: 105 mmol/L (ref 98–111)
Creatinine, Ser: 4.32 mg/dL — ABNORMAL HIGH (ref 0.61–1.24)
GFR calc Af Amer: 15 mL/min — ABNORMAL LOW (ref >60)
GFR calc non Af Amer: 13 mL/min — ABNORMAL LOW (ref >60)
Glucose, Bld: 130 mg/dL — ABNORMAL HIGH (ref 70–99)
Potassium: 4.3 mmol/L (ref 3.5–5.1)
Sodium: 139 mmol/L (ref 135–145)

## 2019-05-05 LAB — CBC WITH DIFFERENTIAL/PLATELET
Abs Immature Granulocytes: 0.01 10*3/uL (ref 0.00–0.07)
Basophils Absolute: 0 10*3/uL (ref 0.0–0.1)
Basophils Relative: 0 %
Eosinophils Absolute: 0.3 10*3/uL (ref 0.0–0.5)
Eosinophils Relative: 3 %
HCT: 38.1 % — ABNORMAL LOW (ref 39.0–52.0)
Hemoglobin: 11.7 g/dL — ABNORMAL LOW (ref 13.0–17.0)
Immature Granulocytes: 0 %
Lymphocytes Relative: 25 %
Lymphs Abs: 1.9 10*3/uL (ref 0.7–4.0)
MCH: 28.9 pg (ref 26.0–34.0)
MCHC: 30.7 g/dL (ref 30.0–36.0)
MCV: 94.1 fL (ref 80.0–100.0)
Monocytes Absolute: 0.6 10*3/uL (ref 0.1–1.0)
Monocytes Relative: 7 %
Neutro Abs: 5 10*3/uL (ref 1.7–7.7)
Neutrophils Relative %: 65 %
Platelets: 235 10*3/uL (ref 150–400)
RBC: 4.05 MIL/uL — ABNORMAL LOW (ref 4.22–5.81)
RDW: 13.2 % (ref 11.5–15.5)
WBC: 7.8 10*3/uL (ref 4.0–10.5)
nRBC: 0 % (ref 0.0–0.2)

## 2019-05-05 LAB — RETICULOCYTES
Immature Retic Fract: 8.4 % (ref 2.3–15.9)
RBC.: 4.07 MIL/uL — ABNORMAL LOW (ref 4.22–5.81)
Retic Count, Absolute: 52.5 10*3/uL (ref 19.0–186.0)
Retic Ct Pct: 1.3 % (ref 0.4–3.1)

## 2019-05-05 LAB — IRON AND TIBC
Iron: 72 ug/dL (ref 45–182)
Saturation Ratios: 29 % (ref 17.9–39.5)
TIBC: 248 ug/dL — ABNORMAL LOW (ref 250–450)
UIBC: 176 ug/dL

## 2019-05-05 LAB — FOLATE: Folate: 7.7 ng/mL (ref >5.9)

## 2019-05-05 LAB — TSH: TSH: 1.251 u[IU]/mL (ref 0.350–4.500)

## 2019-05-05 LAB — SARS CORONAVIRUS 2 BY RT PCR (HOSPITAL ORDER, PERFORMED IN ~~LOC~~ HOSPITAL LAB): SARS Coronavirus 2: NEGATIVE

## 2019-05-05 LAB — FERRITIN: Ferritin: 190 ng/mL (ref 24–336)

## 2019-05-05 LAB — VITAMIN B12: Vitamin B-12: 400 pg/mL (ref 180–914)

## 2019-05-05 LAB — AMMONIA: Ammonia: 21 umol/L (ref 9–35)

## 2019-05-05 MED ORDER — SODIUM CHLORIDE 0.9 % IV SOLN
Freq: Once | INTRAVENOUS | Status: AC
  Administered 2019-05-05: 23:00:00 via INTRAVENOUS

## 2019-05-05 NOTE — ED Notes (Signed)
Patient transported to CT 

## 2019-05-05 NOTE — ED Triage Notes (Signed)
Pt arrived via EMS from home, pt is c/o cough with thick clear mucus x today. Pt denies symptoms, no fevers, no SOB. Pt denies taking any medication for symptoms. And reports no exposure to COVID-19.  EMS did report that they had suctioned out large mucus plug.   HX s/p CVA left side weakness @ baseline  EMS v/s 98/71, HR 68, RR 18, CBG 122,

## 2019-05-05 NOTE — ED Triage Notes (Signed)
Placed pt on 2 lpm via [place] as his O2 saturation dropped to 89%, pt saturations improved to 96%

## 2019-05-05 NOTE — ED Provider Notes (Signed)
[facility] DEPT Provider Note   CSN: [id number] Arrival date & time: 05/05/19  1904     History   Chief Complaint Chief Complaint  Patient presents with  . Cough    HPI Darrell [name] is a 68 y.o. male.  Presents emerged from a chief complaint of a cough, increased generalized weakness and some increased generalized confusion.  History limited due to patient's baseline mental status.  Additional history obtained from patient's wife.  Wife reports that patient has had some increased generalized fatigue and generalized weakness over the past few days couple weeks.  States the only new symptom today was a significant cough, has been productive of clear mucus.  No associated fevers, no sick contacts.  Patient denies any chest pain, difficulty breathing, abdominal pain, nausea or vomiting.     HPI  Past Medical History:  Diagnosis Date  . Arthritis   . At high risk for falls 08/16/2015  . Bradycardia   . Chronic kidney disease    stage 3 GFR 30-59 ml/min   . CIDP (chronic inflammatory demyelinating polyneuropathy) ([place])   . CKD (chronic kidney disease) stage 3, GFR 30-59 ml/min ([name]) 08/16/2015  . Dysphagia as late effect of cerebrovascular disease    pts wife states pt has to eat soft foods   . Elevated liver enzymes 08/10/2016  . GERD (gastroesophageal reflux disease)   . Glaucoma   . High cholesterol   . History of CVA with residual deficit 03/25/2013  . Hypertension   . Hypertensive retinopathy of both eyes 01/16/2017  . Inguinal hernia 03/25/2013  . Liver hemangioma 08/14/2016  . Neuromuscular disorder ([place])    chronic inflammatory demyelinating polyneuropathy   . New onset seizure ([place]) 07/08/2017   seizure 07/14/18  . Nuclear sclerosis of both eyes 10/25/2016  . Presence of permanent cardiac pacemaker    placed in april 2018  . Primary open angle glaucoma of both eyes, indeterminate stage 10/25/2016  . Renal mass, right 08/14/2016  .  Status cardiac pacemaker 01/29/2017   Placed for second degree heart block on 01/16/17 Medtronic Azure XT DR MRI SureScan dual-chamber pacemaker  . Stroke [facility])    2011 with residual deficit left sided weakness  . Tobacco dependence     Patient Active Problem List   Diagnosis Date Noted  . Pain due to onychomycosis of toenails of both feet 03/25/2019  . 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[name]) 07/24/2018  . Dysarthria 05/21/2018  . Dysphagia 05/21/2018  . Gait abnormality 05/21/2018  . Spasticity 05/21/2018  . GERD (gastroesophageal reflux disease) 07/21/2017  . Hyperlipidemia 07/21/2017  . Bradycardia   . Seizures ([place]) 07/08/2017  . Status cardiac pacemaker 01/29/2017  . Unintended weight loss 01/29/2017  . Tinea pedis of right foot 01/29/2017  . Hypertensive retinopathy of both eyes 01/16/2017  . Hypercalcemia 01/15/2017  . Heart block 01/15/2017  . Nuclear sclerosis of both eyes 10/25/2016  . Primary open angle glaucoma of both eyes, indeterminate stage 10/25/2016  . Glaucoma 09/12/2016  . Liver hemangioma 08/14/2016  . Renal cyst 08/14/2016  . Renal mass, right 08/14/2016  . Elevated liver enzymes 08/10/2016  . CKD (chronic kidney disease) stage 3, GFR 30-59 ml/min ([name]) 08/16/2015  . At high risk for falls 08/16/2015  . Subcutaneous nodules 08/16/2015  . Prediabetes 10/07/2013  . Inguinal hernia 03/25/2013  . History of CVA with residual deficit 03/25/2013  . HTN (hypertension) 10/09/2012  . Tobacco abuse 10/09/2012    Past Surgical History:  Procedure Laterality Date  . EYE SURGERY    . HERNIA REPAIR    . INGUINAL HERNIA REPAIR Right 11/23/2014   Procedure: right inguinal hernia repair with mesh;  Surgeon: Armandina Gemma, MD;  Location: [facility];  Service: General;  Laterality: Right;  . INSERTION OF MESH N/A 11/23/2014   Procedure: INSERTION OF MESH;  Surgeon: Armandina Gemma, MD;  Location: [facility];  Service: General;  Laterality: N/A;  . MASS EXCISION Left 08/29/2017    Procedure: EXCISION OF LEFT NECK MASS;  Surgeon: Coralie Keens, MD;  Location: [facility];  Service: General;  Laterality: Left;  . PACEMAKER IMPLANT N/A 01/16/2017   Procedure: Pacemaker Implant;  Surgeon: Will Meredith Leeds, MD;  Location: [facility];  Service: Cardiovascular;  Laterality: N/A;  . SHOULDER SURGERY Bilateral 1988, 1998        Home Medications    Prior to Admission medications   Medication Sig Start Date End Date Taking? Authorizing Provider  aspirin EC 81 MG tablet Take 1 tablet (81 mg total) by mouth daily. 02/20/17   Funches, Adriana Mccallum, MD  atorvastatin (LIPITOR) 40 MG tablet Take 1 tablet (40 mg total) by mouth daily. 02/25/19   [name], MD  bacitracin 500 UNIT/GM ointment Apply 1 application topically 2 (two) times daily. 11/13/18   [name], MD  brimonidine (ALPHAGAN P) 0.1 % SOLN Place 1 drop into both eyes daily.     [provider]  cetirizine (ZYRTEC) 10 MG tablet Take 1 tablet (10 mg total) by mouth daily. 09/12/18   [name], MD  divalproex (DEPAKOTE) 500 MG DR tablet Take 500 mg by mouth 2 (two) times daily. 10/31/18   [provider]  dorzolamide-timolol (COSOPT) 22.3-6.8 MG/ML ophthalmic solution Place 1 drop into both eyes daily.     [provider]  fluticasone (FLONASE) 50 MCG/ACT nasal spray Place 2 sprays into both nostrils daily. 09/12/16   Funches, Adriana Mccallum, MD  folic acid (FOLVITE) 1 MG tablet Take 1 tablet (1 mg total) by mouth daily. 08/15/17   Argentina Donovan, PA-C  hydrALAZINE (APRESOLINE) 25 MG tablet Take 25 mg by mouth 3 (three) times daily. 01/31/19   [provider]  ketoconazole (NIZORAL) 2 % cream Apply 1 application topically daily as needed for irritation.    [provider]  lactulose (CHRONULAC) 10 GM/15ML solution Take 15 mLs (10 g total) by mouth daily. 02/25/19   [name], MD  latanoprost (XALATAN) 0.005 % ophthalmic solution Place 1 drop into both eyes at bedtime.     [provider]  levETIRAcetam (KEPPRA) 500 MG tablet Take 1 tablet (500 mg total) by mouth 2 (two) times daily. 11/11/18   Penumalli, Earlean Polka, MD  lisinopril (ZESTRIL) 10 MG tablet Take 1 tablet (10 mg total) by mouth daily. 02/25/19   [name], MD  metoprolol tartrate (LOPRESSOR) 100 MG tablet Take 1 tablet (100 mg total) by mouth 2 (two) times daily. 03/27/19   Camnitz, Ocie Doyne, MD  Misc. Devices MISC Donut cushion. Dx- Decubitus ulcer of Sacrum 11/13/18   [name], MD  Multiple Vitamins-Iron (DAILY VITAMIN FORMULA+IRON) TABS Take 1 tablet by mouth daily.    [provider]  polyethylene glycol (MIRALAX / GLYCOLAX) packet Take 17 g by mouth daily as needed for mild constipation. 03/07/18   [name], MD  Sennosides-Docusate Sodium (SENNA S PO) Take 1 tablet by mouth daily as needed (constipation).     [provider]  tamsulosin (FLOMAX) 0.4 MG  CAPS capsule Take 1 capsule (0.4 mg total) by mouth daily. 02/25/19   [name], MD  terbinafine (LAMISIL AT) 1 % cream Apply 1 application topically 2 (two) times daily. To R foot 01/29/17   Boykin Nearing, MD  thiamine 100 MG tablet Take 1 tablet (100 mg total) by mouth daily. 07/12/17   Mikhail, Velta Addison, DO  torsemide (DEMADEX) 20 MG tablet Take 20 mg by mouth daily.    [provider]  Vitamin D, Ergocalciferol, (DRISDOL) 1.25 MG (50000 UT) CAPS capsule Take 1 capsule by mouth once a week 04/23/19   [name], MD    Family History Family History  Problem Relation Age of Onset  . Hypertension Mother   . Diabetes Sister   . Hypertension Sister   . Cancer Sister        1 sister  . COPD Sister        in 1 sister  . Stomach cancer Neg Hx   . Colon cancer Neg Hx   . Pancreatic cancer Neg Hx   . Esophageal cancer Neg Hx     Social History Social History   Tobacco Use  . Smoking status: Current Every Day Smoker    Packs/day: 1.00    Years: 40.00    Pack years: 40.00     Types: Cigarettes  . Smokeless tobacco: Never Used  . Tobacco comment: 09/13/17 little < 1 PPD  Substance Use Topics  . Alcohol use: No    Comment: alcohol free for 1 year was drinking 1/2 pint per day   . Drug use: No     Allergies   Ace inhibitors, Doxycycline, Atacand hct [candesartan cilexetil-hctz], and Shellfish allergy   Review of Systems Review of Systems  Constitutional: Negative for chills and fever.  HENT: Negative for ear pain and sore throat.   Eyes: Negative for pain and visual disturbance.  Respiratory: Positive for cough and shortness of breath.   Cardiovascular: Negative for chest pain and palpitations.  Gastrointestinal: Negative for abdominal pain and vomiting.  Genitourinary: Negative for dysuria and hematuria.  Musculoskeletal: Negative for arthralgias and back pain.  Skin: Negative for color change and rash.  Neurological: Negative for seizures and syncope.  All other systems reviewed and are negative.    Physical Exam Updated Vital Signs There were no vitals taken for this visit.  Physical Exam Vitals signs and nursing note reviewed.  Constitutional:      Appearance: He is well-developed.     Comments: Chronically ill-appearing but no acute distress  HENT:     Head: Normocephalic and atraumatic.  Eyes:     Conjunctiva/sclera: Conjunctivae normal.  Neck:     Musculoskeletal: Neck supple.  Cardiovascular:     Rate and Rhythm: Normal rate and regular rhythm.     Heart sounds: No murmur.  Pulmonary:     Effort: Pulmonary effort is normal. No respiratory distress.     Breath sounds: Normal breath sounds.  Abdominal:     Palpations: Abdomen is soft.     Tenderness: There is no abdominal tenderness.  Skin:    General: Skin is warm and dry.  Neurological:     Mental Status: He is alert.      ED Treatments / Results  Labs (all labs ordered are listed, but only abnormal results are displayed) Labs Reviewed - No data to display  EKG None   Radiology No results found.  Procedures Procedures (including critical care time)  Medications Ordered in ED Medications -  No data to display   Initial Impression / Assessment and Plan / ED Course  I have reviewed the triage vital signs and the nursing notes.  Pertinent labs & imaging results that were available during my care of the patient were reviewed by me and considered in my medical decision making (see chart for details).        68 year old male presents to the emergency department with concern for generalized weakness, cough.  Chest x-ray without clear evidence for pneumonia, no fever or leukocytosis.  Chemistry was concerning for significant worsening creatinine and significant elevation in BUN.  Wife reported decreased p.o. intake.  May be dehydration causing this.  Admitted to hospital service for further management.  Time of admission, obtained CT abdomen pelvis to rule out obstructive pathology.  Started on IV fluid for rehydration.  Dr. Hal Hope accepting.  Final Clinical Impressions(s) / ED Diagnoses   Final diagnoses:  Acute kidney injury [facility])  Cough    ED Discharge Orders    None       Lucrezia Starch, MD 05/06/19 [id number]

## 2019-05-06 ENCOUNTER — Encounter (HOSPITAL_COMMUNITY): Payer: Self-pay | Admitting: Internal Medicine

## 2019-05-06 ENCOUNTER — Observation Stay (HOSPITAL_COMMUNITY): Payer: Medicare HMO

## 2019-05-06 DIAGNOSIS — Z833 Family history of diabetes mellitus: Secondary | ICD-10-CM | POA: Diagnosis not present

## 2019-05-06 DIAGNOSIS — Z91013 Allergy to seafood: Secondary | ICD-10-CM | POA: Diagnosis not present

## 2019-05-06 DIAGNOSIS — Z8249 Family history of ischemic heart disease and other diseases of the circulatory system: Secondary | ICD-10-CM | POA: Diagnosis not present

## 2019-05-06 DIAGNOSIS — R569 Unspecified convulsions: Secondary | ICD-10-CM

## 2019-05-06 DIAGNOSIS — R1310 Dysphagia, unspecified: Secondary | ICD-10-CM | POA: Diagnosis not present

## 2019-05-06 DIAGNOSIS — Z20828 Contact with and (suspected) exposure to other viral communicable diseases: Secondary | ICD-10-CM | POA: Diagnosis not present

## 2019-05-06 DIAGNOSIS — Z881 Allergy status to other antibiotic agents status: Secondary | ICD-10-CM | POA: Diagnosis not present

## 2019-05-06 DIAGNOSIS — R05 Cough: Secondary | ICD-10-CM | POA: Diagnosis present

## 2019-05-06 DIAGNOSIS — K5900 Constipation, unspecified: Secondary | ICD-10-CM | POA: Diagnosis present

## 2019-05-06 DIAGNOSIS — G459 Transient cerebral ischemic attack, unspecified: Secondary | ICD-10-CM | POA: Diagnosis not present

## 2019-05-06 DIAGNOSIS — N179 Acute kidney failure, unspecified: Principal | ICD-10-CM

## 2019-05-06 DIAGNOSIS — Z888 Allergy status to other drugs, medicaments and biological substances status: Secondary | ICD-10-CM | POA: Diagnosis not present

## 2019-05-06 DIAGNOSIS — I69354 Hemiplegia and hemiparesis following cerebral infarction affecting left non-dominant side: Secondary | ICD-10-CM | POA: Diagnosis not present

## 2019-05-06 DIAGNOSIS — D631 Anemia in chronic kidney disease: Secondary | ICD-10-CM | POA: Diagnosis present

## 2019-05-06 DIAGNOSIS — I10 Essential (primary) hypertension: Secondary | ICD-10-CM | POA: Diagnosis not present

## 2019-05-06 DIAGNOSIS — H409 Unspecified glaucoma: Secondary | ICD-10-CM | POA: Diagnosis present

## 2019-05-06 DIAGNOSIS — F17210 Nicotine dependence, cigarettes, uncomplicated: Secondary | ICD-10-CM | POA: Diagnosis present

## 2019-05-06 DIAGNOSIS — G1220 Motor neuron disease, unspecified: Secondary | ICD-10-CM

## 2019-05-06 DIAGNOSIS — J189 Pneumonia, unspecified organism: Secondary | ICD-10-CM | POA: Diagnosis not present

## 2019-05-06 DIAGNOSIS — Z9181 History of falling: Secondary | ICD-10-CM | POA: Diagnosis not present

## 2019-05-06 DIAGNOSIS — Z515 Encounter for palliative care: Secondary | ICD-10-CM | POA: Diagnosis not present

## 2019-05-06 DIAGNOSIS — N183 Chronic kidney disease, stage 3 (moderate): Secondary | ICD-10-CM | POA: Diagnosis not present

## 2019-05-06 DIAGNOSIS — M2550 Pain in unspecified joint: Secondary | ICD-10-CM | POA: Diagnosis not present

## 2019-05-06 DIAGNOSIS — I441 Atrioventricular block, second degree: Secondary | ICD-10-CM | POA: Diagnosis present

## 2019-05-06 DIAGNOSIS — G6181 Chronic inflammatory demyelinating polyneuritis: Secondary | ICD-10-CM | POA: Diagnosis not present

## 2019-05-06 DIAGNOSIS — Z950 Presence of cardiac pacemaker: Secondary | ICD-10-CM | POA: Diagnosis not present

## 2019-05-06 DIAGNOSIS — Z7189 Other specified counseling: Secondary | ICD-10-CM | POA: Diagnosis not present

## 2019-05-06 DIAGNOSIS — N390 Urinary tract infection, site not specified: Secondary | ICD-10-CM | POA: Diagnosis not present

## 2019-05-06 DIAGNOSIS — Z7401 Bed confinement status: Secondary | ICD-10-CM | POA: Diagnosis not present

## 2019-05-06 DIAGNOSIS — G40909 Epilepsy, unspecified, not intractable, without status epilepticus: Secondary | ICD-10-CM | POA: Diagnosis not present

## 2019-05-06 DIAGNOSIS — M6281 Muscle weakness (generalized): Secondary | ICD-10-CM | POA: Diagnosis not present

## 2019-05-06 DIAGNOSIS — I129 Hypertensive chronic kidney disease with stage 1 through stage 4 chronic kidney disease, or unspecified chronic kidney disease: Secondary | ICD-10-CM | POA: Diagnosis present

## 2019-05-06 DIAGNOSIS — R52 Pain, unspecified: Secondary | ICD-10-CM | POA: Diagnosis not present

## 2019-05-06 DIAGNOSIS — K219 Gastro-esophageal reflux disease without esophagitis: Secondary | ICD-10-CM | POA: Diagnosis present

## 2019-05-06 LAB — CREATININE, SERUM
Creatinine, Ser: 3.69 mg/dL — ABNORMAL HIGH (ref 0.61–1.24)
GFR calc Af Amer: 19 mL/min — ABNORMAL LOW (ref >60)
GFR calc non Af Amer: 16 mL/min — ABNORMAL LOW (ref >60)

## 2019-05-06 LAB — CBC
HCT: 33.4 % — ABNORMAL LOW (ref 39.0–52.0)
Hemoglobin: 10.7 g/dL — ABNORMAL LOW (ref 13.0–17.0)
MCH: 29.7 pg (ref 26.0–34.0)
MCHC: 32 g/dL (ref 30.0–36.0)
MCV: 92.8 fL (ref 80.0–100.0)
Platelets: 199 10*3/uL (ref 150–400)
RBC: 3.6 MIL/uL — ABNORMAL LOW (ref 4.22–5.81)
RDW: 13.2 % (ref 11.5–15.5)
WBC: 7.9 10*3/uL (ref 4.0–10.5)
nRBC: 0 % (ref 0.0–0.2)

## 2019-05-06 LAB — HIV ANTIBODY (ROUTINE TESTING W REFLEX): HIV Screen 4th Generation wRfx: NONREACTIVE

## 2019-05-06 LAB — SODIUM, URINE, RANDOM: Sodium, Ur: 23 mmol/L

## 2019-05-06 LAB — CREATININE, URINE, RANDOM: Creatinine, Urine: 151.73 mg/dL

## 2019-05-06 MED ORDER — HYDRALAZINE HCL 50 MG PO TABS
100.0000 mg | ORAL_TABLET | Freq: Three times a day (TID) | ORAL | Status: DC
  Administered 2019-05-06 – 2019-05-07 (×2): 100 mg via ORAL
  Filled 2019-05-06 (×4): qty 2

## 2019-05-06 MED ORDER — HEPARIN SODIUM (PORCINE) 5000 UNIT/ML IJ SOLN
5000.0000 [IU] | Freq: Three times a day (TID) | INTRAMUSCULAR | Status: DC
  Administered 2019-05-06 – 2019-05-08 (×7): 5000 [IU] via SUBCUTANEOUS
  Filled 2019-05-06 (×9): qty 1

## 2019-05-06 MED ORDER — DORZOLAMIDE HCL-TIMOLOL MAL 2-0.5 % OP SOLN
1.0000 [drp] | Freq: Every day | OPHTHALMIC | Status: DC
  Administered 2019-05-07 – 2019-05-08 (×2): 1 [drp] via OPHTHALMIC
  Filled 2019-05-06: qty 10

## 2019-05-06 MED ORDER — ACETAMINOPHEN 650 MG RE SUPP: 650.0000 mg | Freq: Four times a day (QID) | RECTAL | Status: DC | PRN

## 2019-05-06 MED ORDER — ASPIRIN EC 81 MG PO TBEC
81.0000 mg | DELAYED_RELEASE_TABLET | Freq: Every day | ORAL | Status: DC
  Administered 2019-05-06 – 2019-05-08 (×3): 81 mg via ORAL
  Filled 2019-05-06 (×3): qty 1

## 2019-05-06 MED ORDER — METOPROLOL TARTRATE 50 MG PO TABS
100.0000 mg | ORAL_TABLET | Freq: Two times a day (BID) | ORAL | Status: DC
  Administered 2019-05-06: 100 mg via ORAL
  Filled 2019-05-06 (×2): qty 2
  Filled 2019-05-06: qty 4
  Filled 2019-05-06: qty 2

## 2019-05-06 MED ORDER — SODIUM CHLORIDE 0.9 % IV SOLN
INTRAVENOUS | Status: AC
  Administered 2019-05-06 (×3): via INTRAVENOUS

## 2019-05-06 MED ORDER — BRIMONIDINE TARTRATE 0.15 % OP SOLN
1.0000 [drp] | Freq: Every day | OPHTHALMIC | Status: DC
  Administered 2019-05-07 – 2019-05-08 (×2): 1 [drp] via OPHTHALMIC
  Filled 2019-05-06: qty 5

## 2019-05-06 MED ORDER — SODIUM CHLORIDE 0.9 % IV SOLN
1.0000 g | INTRAVENOUS | Status: DC
  Administered 2019-05-06 – 2019-05-08 (×3): 1 g via INTRAVENOUS
  Filled 2019-05-06 (×2): qty 1
  Filled 2019-05-06: qty 10

## 2019-05-06 MED ORDER — LEVETIRACETAM 500 MG PO TABS: 500.0000 mg | ORAL_TABLET | Freq: Two times a day (BID) | ORAL | Status: DC

## 2019-05-06 MED ORDER — LACTULOSE 10 GM/15ML PO SOLN
10.0000 g | Freq: Every day | ORAL | Status: DC
  Administered 2019-05-07 – 2019-05-08 (×2): 10 g via ORAL
  Filled 2019-05-06 (×2): qty 15

## 2019-05-06 MED ORDER — SENNOSIDES-DOCUSATE SODIUM 8.6-50 MG PO TABS: 1.0000 | ORAL_TABLET | Freq: Every evening | ORAL | Status: DC | PRN

## 2019-05-06 MED ORDER — LEVETIRACETAM IN NACL 500 MG/100ML IV SOLN
500.0000 mg | Freq: Two times a day (BID) | INTRAVENOUS | Status: DC
  Administered 2019-05-06 – 2019-05-07 (×3): 500 mg via INTRAVENOUS
  Filled 2019-05-06 (×3): qty 100

## 2019-05-06 MED ORDER — ONDANSETRON HCL 4 MG/2ML IJ SOLN: 4.0000 mg | Freq: Four times a day (QID) | INTRAMUSCULAR | Status: DC | PRN

## 2019-05-06 MED ORDER — LATANOPROST 0.005 % OP SOLN
1.0000 [drp] | Freq: Every day | OPHTHALMIC | Status: DC
  Administered 2019-05-06 – 2019-05-07 (×2): 1 [drp] via OPHTHALMIC
  Filled 2019-05-06: qty 2.5

## 2019-05-06 MED ORDER — POLYETHYLENE GLYCOL 3350 17 G PO PACK
17.0000 g | PACK | Freq: Every day | ORAL | Status: DC | PRN
  Administered 2019-05-06: 17 g via ORAL
  Filled 2019-05-06: qty 1

## 2019-05-06 MED ORDER — TAMSULOSIN HCL 0.4 MG PO CAPS
0.4000 mg | ORAL_CAPSULE | Freq: Every day | ORAL | Status: DC
  Administered 2019-05-07 – 2019-05-08 (×2): 0.4 mg via ORAL
  Filled 2019-05-06 (×2): qty 1

## 2019-05-06 MED ORDER — HYDRALAZINE HCL 20 MG/ML IJ SOLN: 10.0000 mg | INTRAMUSCULAR | Status: DC | PRN

## 2019-05-06 MED ORDER — ATORVASTATIN CALCIUM 40 MG PO TABS
40.0000 mg | ORAL_TABLET | Freq: Every day | ORAL | Status: DC
  Administered 2019-05-06 – 2019-05-08 (×3): 40 mg via ORAL
  Filled 2019-05-06 (×3): qty 1

## 2019-05-06 MED ORDER — ACETAMINOPHEN 325 MG PO TABS: 650.0000 mg | ORAL_TABLET | Freq: Four times a day (QID) | ORAL | Status: DC | PRN

## 2019-05-06 MED ORDER — ONDANSETRON HCL 4 MG PO TABS: 4.0000 mg | ORAL_TABLET | Freq: Four times a day (QID) | ORAL | Status: DC | PRN

## 2019-05-06 NOTE — H&P (Signed)
History and Physical    DARAY POLGAR OZH:[id number] DOB: 1950-11-16 DOA: 05/05/2019  PCP: [name], MD  Patient coming from: Home.  Chief Complaint: Choking episode.  History obtained from patient's wife.  HPI: Darrell [name] is a 68 y.o. male with history of motor neuron disease, seizure disorder, chronically disease baseline creatinine was around 2.4 in February 2020, hypertension, bedbound due to weakness from motor neuron disease and previous stroke previous history of alcohol abuse presently off alcohol for last 5 years was brought to the ER after patient had a choking episode while having lunch.  Patient started coughing multiple times after eating but did not vomit.  Did not complain of any chest pain.  Did not have any fever or chills.  Did not have any diarrhea.  ED Course: In the ER patient was not in distress.  Labs revealed worsening creatinine of 4.3 from baseline of 2.4.  UA shows features concerning for UTI.  Chest x-ray shows some infiltrates.  COVID-19 test was negative.  Given the acute renal failure patient was started on fluids and admitted for further management of acute renal failure CT renal study does not show anything acute does show some nonspecific findings in both kidneys which will need further work-up.  Does show constipation.  Labs otherwise show any anemia.  Review of Systems: As per HPI, rest all negative.   Past Medical History:  Diagnosis Date   Arthritis    At high risk for falls 08/16/2015   Bradycardia    Chronic kidney disease    stage 3 GFR 30-59 ml/min    CIDP (chronic inflammatory demyelinating polyneuropathy) ([name])    CKD (chronic kidney disease) stage 3, GFR 30-59 ml/min ([name]) 08/16/2015   Dysphagia as late effect of cerebrovascular disease    pts wife states pt has to eat soft foods    Elevated liver enzymes 08/10/2016   GERD (gastroesophageal reflux disease)    Glaucoma    High cholesterol    History of CVA with  residual deficit 03/25/2013   Hypertension    Hypertensive retinopathy of both eyes 01/16/2017   Inguinal hernia 03/25/2013   Liver hemangioma 08/14/2016   Neuromuscular disorder ([place])    chronic inflammatory demyelinating polyneuropathy    New onset seizure ([place]) 07/08/2017   seizure 07/14/18   Nuclear sclerosis of both eyes 10/25/2016   Presence of permanent cardiac pacemaker    placed in april 2018   Primary open angle glaucoma of both eyes, indeterminate stage 10/25/2016   Renal mass, right 08/14/2016   Status cardiac pacemaker 01/29/2017   Placed for second degree heart block on 01/16/17 Medtronic Azure XT DR MRI SureScan dual-chamber pacemaker   Stroke [facility])    2011 with residual deficit left sided weakness   Tobacco dependence     Past Surgical History:  Procedure Laterality Date   EYE SURGERY     HERNIA REPAIR     INGUINAL HERNIA REPAIR Right 11/23/2014   Procedure: right inguinal hernia repair with mesh;  Surgeon: Armandina Gemma, MD;  Location: [facility];  Service: General;  Laterality: Right;   INSERTION OF MESH N/A 11/23/2014   Procedure: INSERTION OF MESH;  Surgeon: Armandina Gemma, MD;  Location: [facility];  Service: General;  Laterality: N/A;   MASS EXCISION Left 08/29/2017   Procedure: EXCISION OF LEFT NECK MASS;  Surgeon: Coralie Keens, MD;  Location: [facility];  Service: General;  Laterality: Left;   PACEMAKER IMPLANT N/A 01/16/2017   Procedure: Pacemaker  Implant;  Surgeon: Will Meredith Leeds, MD;  Location: [facility];  Service: Cardiovascular;  Laterality: N/A;   SHOULDER SURGERY Bilateral 1988, 1998     reports that he has been smoking cigarettes. He has a 40.00 pack-year smoking history. He has never used smokeless tobacco. He reports that he does not drink alcohol or use drugs.  Allergies  Allergen Reactions   Ace Inhibitors Other (See Comments)    Hyperkalemia   Doxycycline Other (See Comments)    Hiccups, cough, nausea and emesis, elevated liver  enzymes, elevated eosinophils, SOB concerning for early DRESS syndrome    [place] Hct [Candesartan Cilexetil-Hctz] Hives   Shellfish Allergy Hives    Family History  Problem Relation Age of Onset   Hypertension Mother    Diabetes Sister    Hypertension Sister    Cancer Sister        1 sister   COPD Sister        in 1 sister   Stomach cancer Neg Hx    Colon cancer Neg Hx    Pancreatic cancer Neg Hx    Esophageal cancer Neg Hx     Prior to Admission medications   Medication Sig Start Date End Date Taking? Authorizing Provider  aspirin EC 81 MG tablet Take 1 tablet (81 mg total) by mouth daily. 02/20/17  Yes Funches, Josalyn, MD  atorvastatin (LIPITOR) 40 MG tablet Take 1 tablet (40 mg total) by mouth daily. 02/25/19  Yes Newlin, Enobong, MD  brimonidine (ALPHAGAN P) 0.1 % SOLN Place 1 drop into both eyes daily.    Yes [provider]  dorzolamide-timolol (COSOPT) 22.3-6.8 MG/ML ophthalmic solution Place 1 drop into both eyes daily.    Yes [provider]  fluticasone (FLONASE) 50 MCG/ACT nasal spray Place 2 sprays into both nostrils daily. Patient taking differently: Place 2 sprays into both nostrils daily as needed for allergies.  09/12/16  Yes Funches, Josalyn, MD  hydrALAZINE (APRESOLINE) 100 MG tablet Take 100 mg by mouth 3 (three) times daily.   Yes [provider]  ketoconazole (NIZORAL) 2 % cream Apply 1 application topically daily as needed for irritation.   Yes [provider]  lactulose (CHRONULAC) 10 GM/15ML solution Take 15 mLs (10 g total) by mouth daily. 02/25/19  Yes Newlin, Enobong, MD  latanoprost (XALATAN) 0.005 % ophthalmic solution Place 1 drop into both eyes at bedtime.   Yes [provider]  levETIRAcetam (KEPPRA) 500 MG tablet Take 1 tablet (500 mg total) by mouth 2 (two) times daily. 11/11/18  Yes Penumalli, Earlean Polka, MD  lisinopril (ZESTRIL) 10 MG tablet Take 1 tablet (10 mg total) by mouth daily. 02/25/19  Yes  [name], MD  metoprolol tartrate (LOPRESSOR) 100 MG tablet Take 1 tablet (100 mg total) by mouth 2 (two) times daily. 03/27/19  Yes Camnitz, Ocie Doyne, MD  Misc. Devices MISC Donut cushion. Dx- Decubitus ulcer of Sacrum 11/13/18  Yes [name], MD  Multiple Vitamins-Iron (DAILY VITAMIN FORMULA+IRON) TABS Take 1 tablet by mouth daily.   Yes [provider]  polyethylene glycol (MIRALAX / GLYCOLAX) packet Take 17 g by mouth daily as needed for mild constipation. 03/07/18  Yes [name], MD  Sennosides-Docusate Sodium (SENNA S PO) Take 1 tablet by mouth daily as needed (constipation).    Yes [provider]  tamsulosin (FLOMAX) 0.4 MG CAPS capsule Take 1 capsule (0.4 mg total) by mouth daily. 02/25/19  Yes [name], MD  torsemide (DEMADEX) 20 MG  tablet Take 20 mg by mouth daily.   Yes [provider]  Vitamin D, Ergocalciferol, (DRISDOL) 1.25 MG (50000 UT) CAPS capsule Take 1 capsule by mouth once a week 04/23/19  Yes Newlin, Enobong, MD  bacitracin 500 UNIT/GM ointment Apply 1 application topically 2 (two) times daily. Patient not taking: Reported on 05/05/2019 11/13/18   [name], MD  cetirizine (ZYRTEC) 10 MG tablet Take 1 tablet (10 mg total) by mouth daily. Patient not taking: Reported on 05/05/2019 09/12/18   [name], MD  folic acid (FOLVITE) 1 MG tablet Take 1 tablet (1 mg total) by mouth daily. Patient not taking: Reported on 05/05/2019 08/15/17   Argentina Donovan, PA-C  terbinafine (LAMISIL AT) 1 % cream Apply 1 application topically 2 (two) times daily. To R foot Patient not taking: Reported on 05/05/2019 01/29/17   Boykin Nearing, MD  thiamine 100 MG tablet Take 1 tablet (100 mg total) by mouth daily. Patient not taking: Reported on 05/05/2019 07/12/17   Cristal Ford, DO    Physical Exam: Constitutional: Moderately built and nourished. Vitals:   05/05/19 2009 05/05/19 2011 05/05/19 2316  BP:  105/70 104/67  Pulse: 80 75  85  Resp: (!) 22 (!) 21 (!) 22  Temp: (!) 97.4 F (36.3 C)    TempSrc: Oral    SpO2:  93% 91%   Eyes: Anicteric no pallor. ENMT: No discharge from the ears eyes nose or mouth. Neck: No mass felt.  No neck rigidity. Respiratory: No rhonchi or crepitations. Cardiovascular: S1-S2 heard. Abdomen: Soft nontender bowel sounds present. Musculoskeletal: No edema. Skin: No rash. Neurologic: Alert awake oriented to time place and person.  Has difficulty moving all extremities due to weakness from patient's chronic neurological symptoms including motor neuron disease and previous stroke. Psychiatric: Appears normal per normal affect.   Labs on Admission: I have personally reviewed following labs and imaging studies  CBC: Recent Labs  Lab 05/05/19 1929  WBC 7.8  NEUTROABS 5.0  HGB 11.7*  HCT 38.1*  MCV 94.1  PLT 779   Basic Metabolic Panel: Recent Labs  Lab 05/05/19 1929  NA 139  K 4.3  CL 105  CO2 23  GLUCOSE 130*  BUN 78*  CREATININE 4.32*  CALCIUM 10.3   GFR: CrCl cannot be calculated (Unknown ideal weight.). Liver Function Tests: Recent Labs  Lab 05/05/19 1929  AST 14*  ALT 9  ALKPHOS 78  BILITOT 0.5  PROT 7.8  ALBUMIN 4.2   No results for input(s): LIPASE, AMYLASE in the last 168 hours. Recent Labs  Lab 05/05/19 2254  AMMONIA 21   Coagulation Profile: No results for input(s): INR, PROTIME in the last 168 hours. Cardiac Enzymes: No results for input(s): CKTOTAL, CKMB, CKMBINDEX, TROPONINI in the last 168 hours. BNP (last 3 results) No results for input(s): PROBNP in the last 8760 hours. HbA1C: No results for input(s): HGBA1C in the last 72 hours. CBG: No results for input(s): GLUCAP in the last 168 hours. Lipid Profile: No results for input(s): CHOL, HDL, LDLCALC, TRIG, CHOLHDL, LDLDIRECT in the last 72 hours. Thyroid Function Tests: Recent Labs    05/05/19 2007  TSH 1.251   Anemia Panel: Recent Labs    05/05/19 1929  VITAMINB12 400    FOLATE 7.7  FERRITIN 190  TIBC 248*  IRON 72  RETICCTPCT 1.3   Urine analysis:    Component Value Date/Time   COLORURINE YELLOW 05/05/2019 2254   APPEARANCEUR CLEAR 05/05/2019 2254   LABSPEC 1.011 05/05/2019  [place] 5.0 05/05/2019 [place] 05/05/2019 2254   HGBUR NEGATIVE 05/05/2019 2254   BILIRUBINUR NEGATIVE 05/05/2019 2254   BILIRUBINUR negative 09/07/2017 [place] 05/05/2019 2254   PROTEINUR NEGATIVE 05/05/2019 2254   UROBILINOGEN 0.2 09/07/2017 1645   UROBILINOGEN 4.0 (H) 08/04/2016 1517   NITRITE NEGATIVE 05/05/2019 2254   LEUKOCYTESUR MODERATE (A) 05/05/2019 2254   Sepsis Labs: @LABRCNTIP (procalcitonin:4,lacticidven:4) ) Recent Results (from the past 240 hour(s))  SARS Coronavirus 2 [facility] order, Performed in [facility] hospital lab) Nasopharyngeal Nasopharyngeal Swab     Status: None   Collection Time: 05/05/19  9:39 PM   Specimen: Nasopharyngeal Swab  Result Value Ref Range Status   SARS Coronavirus 2 NEGATIVE NEGATIVE Final    Comment: (NOTE) If result is NEGATIVE SARS-CoV-2 target nucleic acids are NOT DETECTED. The SARS-CoV-2 RNA is generally detectable in upper and lower  respiratory specimens during the acute phase of infection. The lowest  concentration of SARS-CoV-2 viral copies this assay can detect is 250  copies / mL. A negative result does not preclude SARS-CoV-2 infection  and should not be used as the sole basis for treatment or other  patient management decisions.  A negative result may occur with  improper specimen collection / handling, submission of specimen other  than nasopharyngeal swab, presence of viral mutation(s) within the  areas targeted by this assay, and inadequate number of viral copies  (<250 copies / mL). A negative result must be combined with clinical  observations, patient history, and epidemiological information. If result is POSITIVE SARS-CoV-2 target nucleic acids are  DETECTED. The SARS-CoV-2 RNA is generally detectable in upper and lower  respiratory specimens dur ing the acute phase of infection.  Positive  results are indicative of active infection with SARS-CoV-2.  Clinical  correlation with patient history and other diagnostic information is  necessary to determine patient infection status.  Positive results do  not rule out bacterial infection or co-infection with other viruses. If result is PRESUMPTIVE POSTIVE SARS-CoV-2 nucleic acids MAY BE PRESENT.   A presumptive positive result was obtained on the submitted specimen  and confirmed on repeat testing.  While 2019 novel coronavirus  (SARS-CoV-2) nucleic acids may be present in the submitted sample  additional confirmatory testing may be necessary for epidemiological  and / or clinical management purposes  to differentiate between  SARS-CoV-2 and other Sarbecovirus currently known to infect humans.  If clinically indicated additional testing with an alternate test  methodology [phone]) is advised. The SARS-CoV-2 RNA is generally  detectable in upper and lower respiratory sp ecimens during the acute  phase of infection. The expected result is Negative. Fact Sheet for Patients:  StrictlyIdeas.no Fact Sheet for Healthcare Providers: BankingDealers.co.za This test is not yet approved or cleared by the [place] FDA and has been authorized for detection and/or diagnosis of SARS-CoV-2 by FDA under an Emergency Use Authorization (EUA).  This EUA will remain in effect (meaning this test can be used) for the duration of the COVID-19 declaration under Section 564(b)(1) of the Act, 21 U.S.C. section 360bbb-3(b)(1), unless the authorization is terminated or revoked sooner. Performed at [facility], [place] [address], [place], [place] [address]      Radiological Exams on Admission: Dg Chest Portable 1 View  Result Date:  05/05/2019 CLINICAL DATA:  Short of breath and cough EXAM: PORTABLE CHEST 1 VIEW COMPARISON:  10/29/2018 FINDINGS: Dual lead pacemaker unchanged in position. Heart size normal. Vascularity  normal. Mild bibasilar airspace disease may represent atelectasis or pneumonia. This is similar to the prior study. No effusion. IMPRESSION: Mild bibasilar atelectasis/infiltrate. Electronically Signed   By: Franchot Gallo M.D.   On: 05/05/2019 19:49   Ct Renal Stone Study  Result Date: 05/05/2019 CLINICAL DATA:  68 year old male with cough and flank pain. Concern for kidney stone. EXAM: CT ABDOMEN AND PELVIS WITHOUT CONTRAST TECHNIQUE: Multidetector CT imaging of the abdomen and pelvis was performed following the standard protocol without IV contrast. COMPARISON:  Abdominal MRI dated 08/23/2016 FINDINGS: Evaluation of this exam is limited in the absence of intravenous contrast. Evaluation is also limited due to respiratory motion artifact. Lower chest: Mild bilateral lower lobe bronchiectasis. Apparent areas of hazy density in the lower lobes bilaterally, likely related to respiratory motion artifact. Clinical correlation is recommended to exclude developing infiltrate. Cardiac pacemaker wire noted. There is no intra-abdominal free air or free fluid. Hepatobiliary: A 2 cm hypodense lesion in the anterior left lobe of the liver which is not characterized on this CT but likely corresponds to the benign cyst or hemangioma described on the prior MRI. There is no intrahepatic biliary ductal dilatation. No calcified gallstone or pericholecystic fluid. Pancreas: Unremarkable. No pancreatic ductal dilatation or surrounding inflammatory changes. Spleen: Normal in size without focal abnormality. Adrenals/Urinary Tract: The adrenal glands are unremarkable. There is no hydronephrosis or nephrolithiasis on either side. Small foci of parenchymal calcification noted bilaterally. There is a 1.5 x 2.0 cm exophytic lesion from the posterior  interpolar aspect of the left kidney with probable area of calcification or proteinaceous debris. This lesion is not characterized. Additional smaller lesion along the medial interpolar left kidney is also not characterized. These lesions however appear similar to the prior MRI. There is a 2.5 cm left renal inferior pole cyst. Additional smaller right renal cysts noted. Further characterization of the renal lesions with MRI without and with contrast on a nonemergent basis recommended. The visualized ureters appear unremarkable. The urinary bladder is collapsed. There is apparent diffuse thickening of the bladder wall which may be partly related to underdistention. Cystitis is not excluded. Correlation with urinalysis recommended. Stomach/Bowel: Fluid and debris noted in the visualized distal esophagus likely representing reflux. There is moderate stool throughout the colon and large stool in the rectal vault. Small scattered colonic diverticula without active inflammatory changes. There is no bowel obstruction. The appendix is normal. Vascular/Lymphatic: Advanced aortoiliac atherosclerotic disease. The IVC is unremarkable. No portal venous gas. There is no adenopathy. Reproductive: The prostate and seminal vesicles are grossly unremarkable. No pelvic mass. Other: None Musculoskeletal: Osteopenia and degenerative changes of the spine. No acute osseous pathology. IMPRESSION: 1. No acute intra-abdominal or pelvic pathology. No hydronephrosis or nephrolithiasis. 2. Indeterminate bilateral renal lesions. Further characterization with MRI without and with contrast on a nonemergent basis recommended. 3. Constipation with large stool in the rectal vault. No bowel obstruction. Normal appendix. 4. Colonic diverticulosis. 5. Aortic Atherosclerosis (ICD10-I70.0). Electronically Signed   By: Anner Crete M.D.   On: 05/05/2019 23:02     Assessment/Plan Principal Problem:   ARF (acute renal failure) ([name]) Active  Problems:   HTN (hypertension)   Seizures ([name])   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[place])    1. Acute on chronic kidney disease stage III cause not clear.  Patient wife denies patient having any vomiting or diarrhea or any recent change in medications.  We will continue with gentle hydration.  Hold lisinopril and torsemide.  Follow intake output metabolic panel.  CT scan does not show any obstructive features. 2. Dysphagia with history of motor neuron disease discussed with on-call neurology Dr. Cheral Marker who at this time advised to get swallow evaluation and also check negative inspiratory pressure and also to capacity.  Patient follows with [facility] for his known history of motor neuron disease. 3. Seizures -on Keppra.  Discussed with pharmacy given the acute renal failure.  We will continue with present dose of Keppra dose as IV until patient passes swallow. 4. Possible UTI on ceftriaxone follow urine cultures. 5. Abnormal lesions in the both kidneys seen on CAT scan will need further work-up as outpatient. 6. Constipation ordered soapsuds enema. 7. Hypertension we will hold lisinopril due to acute renal failure.  Continue beta-blockers and hydralazine and will also keep patient on PRN IV hydralazine. 8. Anemia appears to be chronic follow CBC anemia panel. 9. Previous history of stroke on statins and aspirin.   DVT prophylaxis: Lovenox. Code Status: DNR confirmed with patient and patient's wife. Family Communication: Patient's wife. Disposition Plan: To be determined. Consults called: Discussed with neurologist.  Swallow evaluation. Admission status: Observation.   Rise Patience MD [facility] Pager [phone].  If 7PM-7AM, please contact night-coverage [email or web address] Password [facility]  05/06/2019, 12:33 AM

## 2019-05-06 NOTE — Progress Notes (Signed)
Received order for swallow evaluation, thank you.  Given pt's h/o dysphagia diagnosed at least back to 2018, recommend proceed to MBS in lieu of clinical swallow evaluation.  Orders obtained from [place]. Thank you.   Luanna Salk, [facility] SLP Acute Rehab Services Pager [phone] Office [phone]

## 2019-05-06 NOTE — ED Notes (Signed)
ED TO INPATIENT HANDOFF REPORT  Name/Age/Gender Darrell [name] 68 y.o. male  Code Status    Code Status Orders  (From admission, onward)         Start     Ordered   05/06/19 0607  Do not attempt resuscitation (DNR)  Continuous    Question Answer Comment  In the event of cardiac or respiratory ARREST Do not call a "code blue"   In the event of cardiac or respiratory ARREST Do not perform Intubation, CPR, defibrillation or ACLS   In the event of cardiac or respiratory ARREST Use medication by any route, position, wound care, and other measures to relive pain and suffering. May use oxygen, suction and manual treatment of airway obstruction as needed for comfort.      05/06/19 0606        Code Status History    Date Active Date Inactive Code Status Order ID Comments User Context   05/06/2019 0350 05/06/2019 0606 Full Code [id number]  Rise Patience, MD ED   05/06/2019 0213 05/06/2019 0350 DNR [id number]  Rise Patience, MD ED   07/08/2017 1315 07/12/2017 2103 Full Code [id number]  Allie Bossier, MD ED   07/08/2017 0545 07/08/2017 1314 Full Code [id number]  Toy Baker, MD ED   01/15/2017 2236 01/17/2017 1642 Full Code [id number]  Norval Morton, MD ED   11/23/2014 1014 11/24/2014 1621 Full Code [id number]  Armandina Gemma, MD Inpatient   Advance Care Planning Activity      Home/SNF/Other Home  Chief Complaint Cough  Level of Care/Admitting Diagnosis ED Disposition    ED Disposition Condition [facility] Area: [facility] [[id number]]  Level of Care: Telemetry [5]  Admit to tele based on following criteria: Monitor for Ischemic changes  Covid Evaluation: Asymptomatic Screening Protocol (No Symptoms)  Diagnosis: ARF (acute renal failure) [facility]) [[id number]]  Admitting Physician: Rise Patience [id number]  Attending Physician: Rise Patience [email or web address]  PT Class (Do Not Modify): Observation [104]  PT Acc Code (Do Not Modify):  Observation [10022]       Medical History Past Medical History:  Diagnosis Date  . Arthritis   . At high risk for falls 08/16/2015  . Bradycardia   . Chronic kidney disease    stage 3 GFR 30-59 ml/min   . CIDP (chronic inflammatory demyelinating polyneuropathy) ([place])   . CKD (chronic kidney disease) stage 3, GFR 30-59 ml/min ([name]) 08/16/2015  . Dysphagia as late effect of cerebrovascular disease    pts wife states pt has to eat soft foods   . Elevated liver enzymes 08/10/2016  . GERD (gastroesophageal reflux disease)   . Glaucoma   . High cholesterol   . History of CVA with residual deficit 03/25/2013  . Hypertension   . Hypertensive retinopathy of both eyes 01/16/2017  . Inguinal hernia 03/25/2013  . Liver hemangioma 08/14/2016  . Neuromuscular disorder ([place])    chronic inflammatory demyelinating polyneuropathy   . New onset seizure ([place]) 07/08/2017   seizure 07/14/18  . Nuclear sclerosis of both eyes 10/25/2016  . Presence of permanent cardiac pacemaker    placed in april 2018  . Primary open angle glaucoma of both eyes, indeterminate stage 10/25/2016  . Renal mass, right 08/14/2016  . Status cardiac pacemaker 01/29/2017   Placed for second degree heart block on 01/16/17 Medtronic Azure XT DR MRI SureScan dual-chamber pacemaker  . Stroke [facility])    2011 with residual deficit left sided weakness  .  Tobacco dependence     Allergies Allergies  Allergen Reactions  . Ace Inhibitors Other (See Comments)    Hyperkalemia  . Doxycycline Other (See Comments)    Hiccups, cough, nausea and emesis, elevated liver enzymes, elevated eosinophils, SOB concerning for early DRESS syndrome   . Atacand Hct [Candesartan Cilexetil-Hctz] Hives  . Shellfish Allergy Hives    IV Location/Drains/Wounds Patient Lines/Drains/Airways Status   Active Line/Drains/Airways    Name:   Placement date:   Placement time:   Site:   Days:   Peripheral IV 05/05/19 Right Antecubital   05/05/19    2006     Antecubital   1          Labs/Imaging Results for orders placed or performed during the hospital encounter of 05/05/19 (from the past 48 hour(s))  CBC with Differential     Status: Abnormal   Collection Time: 05/05/19  7:29 PM  Result Value Ref Range   WBC 7.8 4.0 - 10.5 K/uL   RBC 4.05 (L) 4.22 - 5.81 MIL/uL   Hemoglobin 11.7 (L) 13.0 - 17.0 g/dL   HCT 38.1 (L) 39.0 - 52.0 %   MCV 94.1 80.0 - 100.0 fL   MCH 28.9 26.0 - 34.0 pg   MCHC 30.7 30.0 - 36.0 g/dL   RDW 13.2 11.5 - 15.5 %   Platelets 235 150 - 400 K/uL   nRBC 0.0 0.0 - 0.2 %   Neutrophils Relative % 65 %   Neutro Abs 5.0 1.7 - 7.7 K/uL   Lymphocytes Relative 25 %   Lymphs Abs 1.9 0.7 - 4.0 K/uL   Monocytes Relative 7 %   Monocytes Absolute 0.6 0.1 - 1.0 K/uL   Eosinophils Relative 3 %   Eosinophils Absolute 0.3 0.0 - 0.5 K/uL   Basophils Relative 0 %   Basophils Absolute 0.0 0.0 - 0.1 K/uL   Immature Granulocytes 0 %   Abs Immature Granulocytes 0.01 0.00 - 0.07 K/uL    Comment: Performed at [facility], [place] [address] [place], [place] [address]  Basic metabolic panel     Status: Abnormal   Collection Time: 05/05/19  7:29 PM  Result Value Ref Range   Sodium 139 135 - 145 mmol/L   Potassium 4.3 3.5 - 5.1 mmol/L   Chloride 105 98 - 111 mmol/L   CO2 23 22 - 32 mmol/L   Glucose, Bld 130 (H) 70 - 99 mg/dL   BUN 78 (H) 8 - 23 mg/dL   Creatinine, Ser 4.32 (H) 0.61 - 1.24 mg/dL   Calcium 10.3 8.9 - 10.3 mg/dL   GFR calc non Af Amer 13 (L) >60 mL/min   GFR calc Af Amer 15 (L) >60 mL/min   Anion gap 11 5 - 15    Comment: Performed at [facility], [place] [address], [place], [name] [address]  Troponin I (High Sensitivity)     Status: None   Collection Time: 05/05/19  7:29 PM  Result Value Ref Range   Troponin I (High Sensitivity) 9 <18 ng/L    Comment: (NOTE) Elevated high sensitivity troponin I (hsTnI) values and significant  changes across serial measurements may suggest ACS  but many other  chronic and acute conditions are known to elevate hsTnI results.  Refer to the "Links" section for chest pain algorithms and additional  guidance. Performed at [facility], [place] [address] [place], [place] [address]   Hepatic function panel     Status: Abnormal  Collection Time: 05/05/19  7:29 PM  Result Value Ref Range   Total Protein 7.8 6.5 - 8.1 g/dL   Albumin 4.2 3.5 - 5.0 g/dL   AST 14 (L) 15 - 41 U/L   ALT 9 0 - 44 U/L   Alkaline Phosphatase 78 38 - 126 U/L   Total Bilirubin 0.5 0.3 - 1.2 mg/dL   Bilirubin, Direct 0.1 0.0 - 0.2 mg/dL   Indirect Bilirubin 0.4 0.3 - 0.9 mg/dL    Comment: Performed at [facility], [place] [address], [place], [place] [address]  Vitamin B12     Status: None   Collection Time: 05/05/19  7:29 PM  Result Value Ref Range   Vitamin B-12 400 180 - 914 pg/mL    Comment: (NOTE) This assay is not validated for testing neonatal or myeloproliferative syndrome specimens for Vitamin B12 levels. Performed at [facility], [place] [address] [place], [place] [address]   Folate     Status: None   Collection Time: 05/05/19  7:29 PM  Result Value Ref Range   Folate 7.7 >5.9 ng/mL    Comment: Performed at [facility], [place] [address], [place], [name] [address]  Iron and TIBC     Status: Abnormal   Collection Time: 05/05/19  7:29 PM  Result Value Ref Range   Iron 72 45 - 182 ug/dL   TIBC 248 (L) 250 - 450 ug/dL   Saturation Ratios 29 17.9 - 39.5 %   UIBC 176 ug/dL    Comment: Performed at [facility], [place], [place] [address] [place], [name] [address]  Ferritin     Status: None   Collection Time: 05/05/19  7:29 PM  Result Value Ref Range   Ferritin 190 24 - 336 ng/mL    Comment: Performed at [facility], [place] [address] [place], [place] [address]  Reticulocytes     Status: Abnormal   Collection Time: 05/05/19  7:29 PM  Result Value  Ref Range   Retic Ct Pct 1.3 0.4 - 3.1 %   RBC. 4.07 (L) 4.22 - 5.81 MIL/uL   Retic Count, Absolute 52.5 19.0 - 186.0 K/uL   Immature Retic Fract 8.4 2.3 - 15.9 %    Comment: Performed at [facility], [place] [address] [place], [place] [address]  TSH     Status: None   Collection Time: 05/05/19  8:07 PM  Result Value Ref Range   TSH 1.251 0.350 - 4.500 uIU/mL    Comment: Performed by a 3rd Generation assay with a functional sensitivity of <=0.01 uIU/mL. Performed at [facility], [place] [address], [place], [name] [address]   Troponin I (High Sensitivity)     Status: None   Collection Time: 05/05/19  9:35 PM  Result Value Ref Range   Troponin I (High Sensitivity) 9 <18 ng/L    Comment: (NOTE) Elevated high sensitivity troponin I (hsTnI) values and significant  changes across serial measurements may suggest ACS but many other  chronic and acute conditions are known to elevate hsTnI results.  Refer to the "Links" section for chest pain algorithms and additional  guidance. Performed at [facility], [place] [address] [place],  [address]   SARS Coronavirus 2 [facility] order, Performed in [facility] hospital lab) Nasopharyngeal Nasopharyngeal Swab     Status: None   Collection Time: 05/05/19  9:39 PM   Specimen: Nasopharyngeal Swab  Result Value Ref Range  SARS Coronavirus 2 NEGATIVE NEGATIVE    Comment: (NOTE) If result is NEGATIVE SARS-CoV-2 target nucleic acids are NOT DETECTED. The SARS-CoV-2 RNA is generally detectable in upper and lower  respiratory specimens during the acute phase of infection. The lowest  concentration of SARS-CoV-2 viral copies this assay can detect is 250  copies / mL. A negative result does not preclude SARS-CoV-2 infection  and should not be used as the sole basis for treatment or other  patient management decisions.  A negative result may occur with  improper specimen collection / handling,  submission of specimen other  than nasopharyngeal swab, presence of viral mutation(s) within the  areas targeted by this assay, and inadequate number of viral copies  (<250 copies / mL). A negative result must be combined with clinical  observations, patient history, and epidemiological information. If result is POSITIVE SARS-CoV-2 target nucleic acids are DETECTED. The SARS-CoV-2 RNA is generally detectable in upper and lower  respiratory specimens dur ing the acute phase of infection.  Positive  results are indicative of active infection with SARS-CoV-2.  Clinical  correlation with patient history and other diagnostic information is  necessary to determine patient infection status.  Positive results do  not rule out bacterial infection or co-infection with other viruses. If result is PRESUMPTIVE POSTIVE SARS-CoV-2 nucleic acids MAY BE PRESENT.   A presumptive positive result was obtained on the submitted specimen  and confirmed on repeat testing.  While 2019 novel coronavirus  (SARS-CoV-2) nucleic acids may be present in the submitted sample  additional confirmatory testing may be necessary for epidemiological  and / or clinical management purposes  to differentiate between  SARS-CoV-2 and other Sarbecovirus currently known to infect humans.  If clinically indicated additional testing with an alternate test  methodology [phone]) is advised. The SARS-CoV-2 RNA is generally  detectable in upper and lower respiratory sp ecimens during the acute  phase of infection. The expected result is Negative. Fact Sheet for Patients:  StrictlyIdeas.no Fact Sheet for Healthcare Providers: BankingDealers.co.za This test is not yet approved or cleared by the [place] FDA and has been authorized for detection and/or diagnosis of SARS-CoV-2 by FDA under an Emergency Use Authorization (EUA).  This EUA will remain in effect (meaning this test can be  used) for the duration of the COVID-19 declaration under Section 564(b)(1) of the Act, 21 U.S.C. section 360bbb-3(b)(1), unless the authorization is terminated or revoked sooner. Performed at [facility], [place] [address], [place], [place] [address]   Urinalysis, Complete w Microscopic     Status: Abnormal   Collection Time: 05/05/19 10:54 PM  Result Value Ref Range   Color, Urine YELLOW YELLOW   APPearance CLEAR CLEAR   Specific Gravity, Urine 1.011 1.005 - 1.030   pH 5.0 5.0 - 8.0   Glucose, UA NEGATIVE NEGATIVE mg/dL   Hgb urine dipstick NEGATIVE NEGATIVE   Bilirubin Urine NEGATIVE NEGATIVE   Ketones, ur NEGATIVE NEGATIVE mg/dL   Protein, ur NEGATIVE NEGATIVE mg/dL   Nitrite NEGATIVE NEGATIVE   Leukocytes,Ua MODERATE (A) NEGATIVE   RBC / HPF 0-5 0 - 5 RBC/hpf   WBC, UA 6-10 0 - 5 WBC/hpf   Bacteria, UA RARE (A) NONE SEEN   Squamous Epithelial / LPF 0-5 0 - 5   Mucus PRESENT    Hyaline Casts, UA PRESENT    Amorphous Crystal PRESENT     Comment: Performed at [facility], [place] [address], [place], [place] [address]  Ammonia  Status: None   Collection Time: 05/05/19 10:54 PM  Result Value Ref Range   Ammonia 21 9 - 35 umol/L    Comment: Performed at [facility], [place] [address] [place], [place] [address]  CBC     Status: Abnormal   Collection Time: 05/06/19  4:40 AM  Result Value Ref Range   WBC 7.9 4.0 - 10.5 K/uL   RBC 3.60 (L) 4.22 - 5.81 MIL/uL   Hemoglobin 10.7 (L) 13.0 - 17.0 g/dL   HCT 33.4 (L) 39.0 - 52.0 %   MCV 92.8 80.0 - 100.0 fL   MCH 29.7 26.0 - 34.0 pg   MCHC 32.0 30.0 - 36.0 g/dL   RDW 13.2 11.5 - 15.5 %   Platelets 199 150 - 400 K/uL   nRBC 0.0 0.0 - 0.2 %    Comment: Performed at [facility], [place] [address], [place], [place] [address]  Creatinine, serum     Status: Abnormal   Collection Time: 05/06/19  4:40 AM  Result Value Ref Range   Creatinine, Ser 3.69 (H) 0.61 -  1.24 mg/dL   GFR calc non Af Amer 16 (L) >60 mL/min   GFR calc Af Amer 19 (L) >60 mL/min    Comment: Performed at [facility], [place] [address] [place], [place] [address]  Sodium, urine, random     Status: None   Collection Time: 05/06/19  5:45 AM  Result Value Ref Range   Sodium, Ur 23 mmol/L    Comment: Performed at [facility], [place] [address], [place], [place] [address]  Creatinine, urine, random     Status: None   Collection Time: 05/06/19  5:45 AM  Result Value Ref Range   Creatinine, Urine 151.73 mg/dL    Comment: Performed at [facility], [place] [address] [place], [place] [address]   Dg Chest Portable 1 View  Result Date: 05/05/2019 CLINICAL DATA:  Short of breath and cough EXAM: PORTABLE CHEST 1 VIEW COMPARISON:  10/29/2018 FINDINGS: Dual lead pacemaker unchanged in position. Heart size normal. Vascularity normal. Mild bibasilar airspace disease may represent atelectasis or pneumonia. This is similar to the prior study. No effusion. IMPRESSION: Mild bibasilar atelectasis/infiltrate. Electronically Signed   By: Franchot Gallo M.D.   On: 05/05/2019 19:49   Ct Renal Stone Study  Result Date: 05/05/2019 CLINICAL DATA:  68 year old male with cough and flank pain. Concern for kidney stone. EXAM: CT ABDOMEN AND PELVIS WITHOUT CONTRAST TECHNIQUE: Multidetector CT imaging of the abdomen and pelvis was performed following the standard protocol without IV contrast. COMPARISON:  Abdominal MRI dated 08/23/2016 FINDINGS: Evaluation of this exam is limited in the absence of intravenous contrast. Evaluation is also limited due to respiratory motion artifact. Lower chest: Mild bilateral lower lobe bronchiectasis. Apparent areas of hazy density in the lower lobes bilaterally, likely related to respiratory motion artifact. Clinical correlation is recommended to exclude developing infiltrate. Cardiac pacemaker wire noted. There is no intra-abdominal  free air or free fluid. Hepatobiliary: A 2 cm hypodense lesion in the anterior left lobe of the liver which is not characterized on this CT but likely corresponds to the benign cyst or hemangioma described on the prior MRI. There is no intrahepatic biliary ductal dilatation. No calcified gallstone or pericholecystic fluid. Pancreas: Unremarkable. No pancreatic ductal dilatation or surrounding inflammatory changes. Spleen: Normal in size without focal abnormality. Adrenals/Urinary Tract: The adrenal glands are unremarkable. There is no hydronephrosis or nephrolithiasis on either  side. Small foci of parenchymal calcification noted bilaterally. There is a 1.5 x 2.0 cm exophytic lesion from the posterior interpolar aspect of the left kidney with probable area of calcification or proteinaceous debris. This lesion is not characterized. Additional smaller lesion along the medial interpolar left kidney is also not characterized. These lesions however appear similar to the prior MRI. There is a 2.5 cm left renal inferior pole cyst. Additional smaller right renal cysts noted. Further characterization of the renal lesions with MRI without and with contrast on a nonemergent basis recommended. The visualized ureters appear unremarkable. The urinary bladder is collapsed. There is apparent diffuse thickening of the bladder wall which may be partly related to underdistention. Cystitis is not excluded. Correlation with urinalysis recommended. Stomach/Bowel: Fluid and debris noted in the visualized distal esophagus likely representing reflux. There is moderate stool throughout the colon and large stool in the rectal vault. Small scattered colonic diverticula without active inflammatory changes. There is no bowel obstruction. The appendix is normal. Vascular/Lymphatic: Advanced aortoiliac atherosclerotic disease. The IVC is unremarkable. No portal venous gas. There is no adenopathy. Reproductive: The prostate and seminal vesicles are  grossly unremarkable. No pelvic mass. Other: None Musculoskeletal: Osteopenia and degenerative changes of the spine. No acute osseous pathology. IMPRESSION: 1. No acute intra-abdominal or pelvic pathology. No hydronephrosis or nephrolithiasis. 2. Indeterminate bilateral renal lesions. Further characterization with MRI without and with contrast on a nonemergent basis recommended. 3. Constipation with large stool in the rectal vault. No bowel obstruction. Normal appendix. 4. Colonic diverticulosis. 5. Aortic Atherosclerosis (ICD10-I70.0). Electronically Signed   By: Anner Crete M.D.   On: 05/05/2019 23:02    Pending Labs Unresulted Labs (From admission, onward)    Start     Ordered   05/07/19 0500  CBC with Differential/Platelet  Tomorrow morning,   R     05/06/19 1210   05/07/19 0500  Magnesium  Tomorrow morning,   R     05/06/19 1210   05/07/19 0500  Renal function panel  Tomorrow morning,   R     05/06/19 1210   05/06/19 0608  Culture, Urine  Once,   STAT     05/06/19 0607   05/06/19 0500  HIV antibody (Routine Testing)  Tomorrow morning,   R     05/06/19 0350   05/05/19 2215  Protein electrophoresis, serum  Once,   STAT     05/05/19 2214          Vitals/Pain Today's Vitals   05/06/19 1055 05/06/19 1115 05/06/19 1117 05/06/19 1130  BP:   91/63   Pulse:  (!) 59 66 60  Resp:  (!) 21 20 13   Temp:      TempSrc:      SpO2:  100% 100% 100%  PainSc: Asleep       Isolation Precautions No active isolations  Medications Medications  aspirin EC tablet 81 mg (81 mg Oral Given 05/06/19 0912)  atorvastatin (LIPITOR) tablet 40 mg (40 mg Oral Given 05/06/19 0911)  hydrALAZINE (APRESOLINE) tablet 100 mg (100 mg Oral Given 05/06/19 0913)  metoprolol tartrate (LOPRESSOR) tablet 100 mg (100 mg Oral Given 05/06/19 0909)  lactulose (CHRONULAC) 10 GM/15ML solution 10 g (0 g Oral Hold 05/06/19 1129)  polyethylene glycol (MIRALAX / GLYCOLAX) packet 17 g (has no administration in time range)   senna-docusate (Senokot-S) tablet 1 tablet (has no administration in time range)  tamsulosin (FLOMAX) capsule 0.4 mg (0 mg Oral Hold 05/06/19 1129)  brimonidine (ALPHAGAN) 0.15 %  ophthalmic solution 1 drop (has no administration in time range)  dorzolamide-timolol (COSOPT) 22.3-6.8 MG/ML ophthalmic solution 1 drop (has no administration in time range)  latanoprost (XALATAN) 0.005 % ophthalmic solution 1 drop (has no administration in time range)  acetaminophen (TYLENOL) tablet 650 mg (has no administration in time range)    Or  acetaminophen (TYLENOL) suppository 650 mg (has no administration in time range)  ondansetron (ZOFRAN) tablet 4 mg (has no administration in time range)    Or  ondansetron (ZOFRAN) injection 4 mg (has no administration in time range)  heparin injection 5,000 Units (5,000 Units Subcutaneous Given 05/06/19 0914)  0.9 %  sodium chloride infusion ( Intravenous New Bag/Given 05/06/19 0905)  hydrALAZINE (APRESOLINE) injection 10 mg (has no administration in time range)  levETIRAcetam (KEPPRA) IVPB 500 mg/100 mL premix (0 mg Intravenous Stopped 05/06/19 0923)  cefTRIAXone (ROCEPHIN) 1 g in sodium chloride 0.9 % 100 mL IVPB (0 g Intravenous Stopped 05/06/19 0716)  0.9 %  sodium chloride infusion ( Intravenous Stopped 05/06/19 0453)    Mobility non-ambulatory

## 2019-05-06 NOTE — ED Notes (Signed)
Report given to floor, pt currently in radiology for swallow eval.  Will be transported upstairs upon return to ED.

## 2019-05-06 NOTE — Progress Notes (Signed)
NIF -5 poor effort FVC .5L

## 2019-05-06 NOTE — Progress Notes (Signed)
RN got a report at 41. Patient was transferred to have swallowing test at this time and will be transferred to [place] after this procedure done. Patient arrived at [place] at 11. Alert and oriented x 3. No pain complained, no skin issue. Wife is at bedside. Call light was within patient's reach. Room is set up.

## 2019-05-06 NOTE — Plan of Care (Signed)
  Problem: Activity: Goal: Risk for activity intolerance will decrease Outcome: Progressing   Problem: Nutrition: Goal: Adequate nutrition will be maintained Outcome: Progressing   Problem: Coping: Goal: Level of anxiety will decrease Outcome: Progressing   Problem: Elimination: Goal: Will not experience complications related to urinary retention Outcome: Progressing   Problem: Safety: Goal: Ability to remain free from injury will improve Outcome: Progressing

## 2019-05-06 NOTE — Progress Notes (Signed)
Modified Barium Swallow Progress Note  Patient Details  Name: Darrell [name] MRN: [id number] Date of Birth: 05/31/1951  Today's Date: 05/06/2019  Modified Barium Swallow completed.  Full report located under Chart Review in the Imaging Section.  Brief recommendations include the following:  Clinical Impression  Pt presents with primarily oral dysphagia due to weakness resulting in decreased coordination/timeliness/strength and transiting of boluses into pharynx.  Transiting is worse with solids/items of increased viscocity as pt tends to extend head back to aid oral transiting.  He did not aspirate and very mild laryngeal penetration of thin observed.  Pharyngeal swallow was strong without residuals fortuantely. At times pt spills to pyriform sinus prior to swallow intitation.  Suspect pt overtly aspirated on his solids due to level of oral control/strength deficits.  Educated wife and pt to findings/recommendations during session using teach back and video.  SLP will follow up for dysphagia management and education.   Swallow Evaluation Recommendations       SLP Diet Recommendations: Dysphagia 1 (Puree) solids;Thin liquid   Liquid Administration via: Straw;Cup   Medication Administration: Whole meds with puree   Supervision: Patient able to self feed   Compensations: Slow rate;Small sips/bites       Oral Care Recommendations: Oral care before and after PO(oral care after meals)      Luanna Salk, [place] [facility] SLP Acute Rehab Services Pager [phone] Office [phone]   Macario Golds 05/06/2019,1:48 PM

## 2019-05-06 NOTE — Progress Notes (Signed)
Patient's wife asked RN to give her supplies so she can take care of her husband by herself. RN offered that she could press the call light to ask for help whenever she needed. RN gave all supplies to her as she demanded.

## 2019-05-06 NOTE — Progress Notes (Signed)
pMN admission  68 yo M presenting with AKI on CKD3 and dysphagia. See H&P for full details. MBS pending w/ SLP. Admission SCr 4.32, now 3.69. Baseline Scr is 2-2.4. Continue fluids. Awaiting final neuro recs otherwise. Started on rocephin for UA findings in ED. No white count or fevers. UCx pending. Would stop if it return negative. Otherwise, continue as per H&P.   General: 68 y.o. male resting in bed in NAD Cardiovascular: RRR, +S1, S2, no m/g/r, equal pulses throughout Respiratory: CTABL, no w/r/r, normal WOB GI: BS+, NDNT, no masses noted, no organomegaly noted MSK: No e/c/c Skin: No rashes, bruises, ulcerations noted Neuro: alert, calm, cooperative; contracture noted  .Jonnie Finner, DO

## 2019-05-07 DIAGNOSIS — Z515 Encounter for palliative care: Secondary | ICD-10-CM

## 2019-05-07 DIAGNOSIS — Z7189 Other specified counseling: Secondary | ICD-10-CM

## 2019-05-07 LAB — RENAL FUNCTION PANEL
Albumin: 2.7 g/dL — ABNORMAL LOW (ref 3.5–5.0)
Anion gap: 7 (ref 5–15)
BUN: 71 mg/dL — ABNORMAL HIGH (ref 8–23)
CO2: 20 mmol/L — ABNORMAL LOW (ref 22–32)
Calcium: 9.1 mg/dL (ref 8.9–10.3)
Chloride: 112 mmol/L — ABNORMAL HIGH (ref 98–111)
Creatinine, Ser: 3.31 mg/dL — ABNORMAL HIGH (ref 0.61–1.24)
GFR calc Af Amer: 21 mL/min — ABNORMAL LOW (ref >60)
GFR calc non Af Amer: 18 mL/min — ABNORMAL LOW (ref >60)
Glucose, Bld: 117 mg/dL — ABNORMAL HIGH (ref 70–99)
Phosphorus: 3.2 mg/dL (ref 2.5–4.6)
Potassium: 4.6 mmol/L (ref 3.5–5.1)
Sodium: 139 mmol/L (ref 135–145)

## 2019-05-07 LAB — MAGNESIUM: Magnesium: 1.9 mg/dL (ref 1.7–2.4)

## 2019-05-07 LAB — CBC WITH DIFFERENTIAL/PLATELET
Abs Immature Granulocytes: 0.03 10*3/uL (ref 0.00–0.07)
Basophils Absolute: 0 10*3/uL (ref 0.0–0.1)
Basophils Relative: 0 %
Eosinophils Absolute: 0.1 10*3/uL (ref 0.0–0.5)
Eosinophils Relative: 1 %
HCT: 28.7 % — ABNORMAL LOW (ref 39.0–52.0)
Hemoglobin: 8.9 g/dL — ABNORMAL LOW (ref 13.0–17.0)
Immature Granulocytes: 0 %
Lymphocytes Relative: 24 %
Lymphs Abs: 2.1 10*3/uL (ref 0.7–4.0)
MCH: 29.7 pg (ref 26.0–34.0)
MCHC: 31 g/dL (ref 30.0–36.0)
MCV: 95.7 fL (ref 80.0–100.0)
Monocytes Absolute: 0.7 10*3/uL (ref 0.1–1.0)
Monocytes Relative: 8 %
Neutro Abs: 5.6 10*3/uL (ref 1.7–7.7)
Neutrophils Relative %: 67 %
Platelets: 169 10*3/uL (ref 150–400)
RBC: 3 MIL/uL — ABNORMAL LOW (ref 4.22–5.81)
RDW: 13.3 % (ref 11.5–15.5)
WBC: 8.6 10*3/uL (ref 4.0–10.5)
nRBC: 0 % (ref 0.0–0.2)

## 2019-05-07 LAB — URINE CULTURE: Culture: <10000 — AB

## 2019-05-07 MED ORDER — LEVETIRACETAM 500 MG PO TABS
500.0000 mg | ORAL_TABLET | Freq: Two times a day (BID) | ORAL | Status: DC
  Administered 2019-05-07 – 2019-05-08 (×2): 500 mg via ORAL
  Filled 2019-05-07 (×2): qty 1

## 2019-05-07 MED ORDER — AZITHROMYCIN 250 MG PO TABS
500.0000 mg | ORAL_TABLET | Freq: Every day | ORAL | Status: DC
  Administered 2019-05-07 – 2019-05-08 (×2): 500 mg via ORAL
  Filled 2019-05-07 (×2): qty 2

## 2019-05-07 MED ORDER — LIP MEDEX EX OINT
TOPICAL_OINTMENT | CUTANEOUS | Status: AC
  Filled 2019-05-07: qty 7

## 2019-05-07 MED ORDER — SODIUM CHLORIDE 0.9 % IV SOLN
INTRAVENOUS | Status: DC
  Administered 2019-05-07: 14:00:00 via INTRAVENOUS

## 2019-05-07 MED ORDER — HYDRALAZINE HCL 25 MG PO TABS
25.0000 mg | ORAL_TABLET | Freq: Three times a day (TID) | ORAL | Status: DC
  Filled 2019-05-07 (×2): qty 1

## 2019-05-07 NOTE — Evaluation (Signed)
Physical Therapy Evaluation Patient Details Name: Darrell [name] MRN: [id number] DOB: 03-03-1951 Today's Date: 05/07/2019   History of Present Illness  68 yo male admitted to ED on 8/10 with difficulty swallowing, cough, mucus plug. Pt with history of motor neuron disease (chronic inflammatory demyelinating polyneuropathy), CVA with residual L paresis, OA, CKD, HTN, pacemaker.  Clinical Impression  Pt presents with total body weakness secondary to CIDP, difficulty performing mobility tasks, decreased activity tolerance, and impaired sitting and standing balance. Pt to benefit from acute PT to address deficits. Pt required mod assist +2 for bed mobility and sit to stand this session, with limited tolerance for both unsupported sitting balance and standing balance. Pt's wife present for session, and states she manages well at home but feels she needs assistance at this time. PT recommending HHPT, along with [place] aide, to address pt's needs post-acutely. PT to progress mobility as tolerated, and will continue to follow acutely.     Follow Up Recommendations Home health PT;Supervision/Assistance - 24 hour    Equipment Recommendations  None recommended by PT    Recommendations for Other Services       Precautions / Restrictions Precautions Precautions: Fall Restrictions Weight Bearing Restrictions: No      Mobility  Bed Mobility Overal bed mobility: Needs Assistance Bed Mobility: Supine to Sit;Sit to Supine     Supine to sit: +2 for physical assistance;Mod assist;HOB elevated Sit to supine: Mod assist;HOB elevated;+2 for physical assistance   General bed mobility comments: mod assist +2 for supine<>sit for trunk elevation, LE management to EOB, scooting to and from EOB with use of bed pads, and scooting up in bed with use of bed pads.  Transfers Overall transfer level: Needs assistance Equipment used: Rolling walker (2 wheeled) Transfers: Sit to/from Stand Sit to Stand: Mod  assist;From elevated surface;+2 safety/equipment         General transfer comment: mod assist for power up, hip extension, and steadying upon standing. Verbal cuing for hand placement, increased time to rise and steady. Pt stood for ~1 minute, attempted to take forward step but lacked hip flexion strength and stance leg stability to bring opposite limb forward. Pt also with bilateral LE trembling due to fatigue.  Ambulation/Gait Ambulation/Gait assistance: (NT)              Stairs            Wheelchair Mobility    Modified Rankin (Stroke Patients Only)       Balance Overall balance assessment: Needs assistance;History of Falls Sitting-balance support: Bilateral upper extremity supported Sitting balance-Leahy Scale: Fair Sitting balance - Comments: able to sit for short periods unsupported, PT at times assisting pt in postural correction and UE propping. Postural control: Posterior lean Standing balance support: Bilateral upper extremity supported Standing balance-Leahy Scale: Poor Standing balance comment: reliant on UE support and PT for stability                             Pertinent Vitals/Pain Pain Assessment: No/denies pain    Home Living Family/patient expects to be discharged to:: Private residence Living Arrangements: Spouse/significant other Available Help at Discharge: Family;Available 24 hours/day Type of Home: House Home Access: Ramped entrance     Home Layout: One level Home Equipment: Walker - 2 wheels;Tub bench;Wheelchair - Education officer, community - power;Bedside commode      Prior Function Level of Independence: Needs assistance   Gait / Transfers Assistance Needed: Pt uses  RW for short distance ambulation in the house. Pt's wife assists him with all transfers into and out of wheelchair  ADL's / [place] Needed: wife assists with dressing, bating, cooking, cleaning.        Hand Dominance   Dominant Hand: Right     Extremity/Trunk Assessment   Upper Extremity Assessment Upper Extremity Assessment: Defer to OT evaluation    Lower Extremity Assessment Lower Extremity Assessment: Generalized weakness;LLE deficits/detail LLE Deficits / Details: history of CVA with L sided residual deficits. Bilaterally, pt is very weak    Cervical / Trunk Assessment Cervical / Trunk Assessment: Kyphotic  Communication   Communication: Other (comment)(pt very softspoken and speaks slowly)  Cognition Arousal/Alertness: Awake/alert(somewhat drowsy, speaks with PT) Behavior During Therapy: WFL for tasks assessed/performed Overall Cognitive Status: Within Functional Limits for tasks assessed Area of Impairment: Problem solving;Following commands                       Following Commands: Follows one step commands consistently;Follows one step commands with increased time     Problem Solving: Slow processing;Decreased initiation;Difficulty sequencing;Requires tactile cues;Requires verbal cues General Comments: pt follows one step commands with increased time, has a good understanding of his physical deficits      General Comments      Exercises     Assessment/Plan    PT Assessment Patient needs continued PT services  PT Problem List Decreased strength;Decreased mobility;Decreased activity tolerance;Decreased balance;Decreased knowledge of use of DME       PT Treatment Interventions DME instruction;Therapeutic activities;Therapeutic exercise;Patient/family education;Balance training;Functional mobility training;Gait training    PT Goals (Current goals can be found in the Care Plan section)  Acute Rehab PT Goals Patient Stated Goal: be more steady PT Goal Formulation: With patient Time For Goal Achievement: 05/21/19 Potential to Achieve Goals: Good    Frequency Min 3X/week   Barriers to discharge        Co-evaluation               AM-PAC PT "6 Clicks" Mobility  Outcome Measure Help  needed turning from your back to your side while in a flat bed without using bedrails?: A Lot Help needed moving from lying on your back to sitting on the side of a flat bed without using bedrails?: A Lot Help needed moving to and from a bed to a chair (including a wheelchair)?: A Lot Help needed standing up from a chair using your arms (e.g., wheelchair or bedside chair)?: A Lot Help needed to walk in hospital room?: Total Help needed climbing 3-5 steps with a railing? : Total 6 Click Score: 10    End of Session Equipment Utilized During Treatment: Gait belt Activity Tolerance: Patient tolerated treatment well;Patient limited by fatigue Patient left: in bed;with bed alarm set;with family/visitor present;with call bell/phone within reach Nurse Communication: Mobility status PT Visit Diagnosis: Other abnormalities of gait and mobility (R26.89);Muscle weakness (generalized) (M62.81);Unsteadiness on feet (R26.81)    Time: 8563-1497 PT Time Calculation (min) (ACUTE ONLY): 17 min   Charges:   PT Evaluation $PT Eval Low Complexity: 1 Low         Oliwia Berzins Conception Chancy, PT Acute Rehabilitation Services Pager [phone]  Office [phone]   Markees Carns D Elonda Husky 05/07/2019, 5:46 PM

## 2019-05-07 NOTE — Consult Note (Signed)
Consultation Note Date: 05/07/2019   Patient Name: Darrell [name]  DOB: 08/18/1951  MRN: [id number]  Age / Sex: 68 y.o., male  PCP: [name], MD Referring Physician: Elmarie Shiley, MD  Reason for Consultation: Establishing goals of care  HPI/Patient Profile: 68 y.o. male  with past medical history of chronic inflammatory demyelinating polyneuropathy, CVA w/ residual L sided weakness, seizures, CKD,  HTN, and ETOH use (none in past 5 years) admitted on 05/05/2019 with choking episode, dysphagia, weakness, and coughing. Found to have AKI and possible UTI. Chest x ray revealed bilateral infiltrates. PMT consulted for [place].   Clinical Assessment and Goals of Care: I have reviewed medical records including EPIC notes, labs and imaging, and received report from RN - tells me patient is eating okay, worked with PT and SLP. He is alert but speech is slurred.   I then spoke with patient's wife. Darrell [name],  to discuss diagnosis prognosis, GOC, EOL wishes, disposition and options.  I introduced Palliative Medicine as specialized medical care for people living with serious illness. It focuses on providing relief from the symptoms and stress of a serious illness. The goal is to improve quality of life for both the patient and the family.  We discussed a brief life review of the patient. 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 She tells me about how "sharp-minded" he is - stating, "it's still in there".   As far as functional and nutritional status, Darrell [name] tells me about patient's decline. She tells me "he needs a lot of help". She speaks about his decline in function over the previous years - now essentially bed bound - occasionally up to wheelchair. Unable to get up to bathe and receives bed baths. She tells me she has noticed a decline in his appetite in the past few months. She has also noticed a decline in  cognition - tells me he gets confused easily and doesn't speak much.    We discussed his current illness and what it means in the larger context of his on-going co-morbidities.  Natural disease trajectory and expectations at EOL were discussed. Darrell [name] feels like she has good understanding of diagnoses - she feels like she has good relationships with patient's neurologists as well. We speak about his neurological disorder and kidney disease.   I attempted to elicit values and goals of care important to the patient. She tells me the patient's main goal is to be at home and she agrees. She wants to avoid facility placement.      Advance directives, concepts specific to code status, artifical feeding and hydration, and rehospitalization were considered and discussed. Darrell [name] tells me she has never spoken to the patient about his wishes about these issues. He is established as DNR - but she is unsure about wishes as far as artificial feeding/hydration and rehospitalization. We discussed both of these topics and I encouraged her to discuss these topics with the patient as much as he is able so that she understands his wishes as his disease progresses. She agrees to do this.   Darrell [name] shares a lot about the emotional toll of patient's illness - discussed ways to care for herself and utilize support systems. She is tearful throughout conversation and emotional support provided.   As far as disposition, Darrell [name] is interested in receiving home health services at home. Palliative Care services outpatient were explained and offered. Darrell [name] would also appreciate continued follow up with palliative  care outpatient.   Questions and concerns were addressed. The family was encouraged to call with questions or concerns.   Primary Decision Maker NEXT OF KIN - spouse Darrell [name] (patient can participate but ability to participate in decision making is limited)    SUMMARY OF RECOMMENDATIONS   - initial palliative  discussion - encouraged wife to have discussion with patient about goals of care as he is able (such as feeding tube, rehospitalization - she is unsure of his wishes surrounding these topics) - will refer to outpatient palliative - wife is interested in home health services (likely eligible for hospice services d/t bedbound status, dysphagia, dependence in ADLS, worsening nutrition ; however, at this time, wife is interested in home health)  Code Status/Advance Care Planning:  DNR - not discussed during our conversation  Psycho-social/Spiritual:   Desire for further Chaplaincy support:no  Additional Recommendations: Education on Hospice  Prognosis:   Unable to determine  Discharge Planning: Home with Palliative Services      Primary Diagnoses: Present on Admission: . ARF (acute renal failure) ([place]) . HTN (hypertension) . 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[place])   I have reviewed the medical record, interviewed the patient and family, and examined the patient. The following aspects are pertinent.  Past Medical History:  Diagnosis Date  . Arthritis   . At high risk for falls 08/16/2015  . Bradycardia   . Chronic kidney disease    stage 3 GFR 30-59 ml/min   . CIDP (chronic inflammatory demyelinating polyneuropathy) ([place])   . CKD (chronic kidney disease) stage 3, GFR 30-59 ml/min ([name]) 08/16/2015  . Dysphagia as late effect of cerebrovascular disease    pts wife states pt has to eat soft foods   . Elevated liver enzymes 08/10/2016  . GERD (gastroesophageal reflux disease)   . Glaucoma   . High cholesterol   . History of CVA with residual deficit 03/25/2013  . Hypertension   . Hypertensive retinopathy of both eyes 01/16/2017  . Inguinal hernia 03/25/2013  . Liver hemangioma 08/14/2016  . Neuromuscular disorder ([place])    chronic inflammatory demyelinating polyneuropathy   . New onset seizure ([place]) 07/08/2017   seizure 07/14/18  . Nuclear sclerosis of both eyes 10/25/2016  .  Presence of permanent cardiac pacemaker    placed in april 2018  . Primary open angle glaucoma of both eyes, indeterminate stage 10/25/2016  . Renal mass, right 08/14/2016  . Status cardiac pacemaker 01/29/2017   Placed for second degree heart block on 01/16/17 Medtronic Azure XT DR MRI SureScan dual-chamber pacemaker  . Stroke [facility])    2011 with residual deficit left sided weakness  . Tobacco dependence    Social History   Socioeconomic History  . Marital status: Married    Spouse name: Darrell [name]  . Number of children: 1  . Years of education: college  . Highest education level: Not on file  Occupational History  . Occupation: retired  Scientific laboratory technician  . Financial resource strain: Not on file  . Food insecurity    Worry: Not on file    Inability: Not on file  . Transportation needs    Medical: Not on file    Non-medical: Not on file  Tobacco Use  . Smoking status: Current Every Day Smoker    Packs/day: 1.00    Years: 40.00    Pack years: 40.00    Types: Cigarettes  . Smokeless tobacco: Never Used  . Tobacco comment: 09/13/17 little < 1 PPD  Substance and  Sexual Activity  . Alcohol use: No    Comment: alcohol free for 1 year was drinking 1/2 pint per day   . Drug use: No  . Sexual activity: Not on file  Lifestyle  . Physical activity    Days per week: Not on file    Minutes per session: Not on file  . Stress: Not on file  Relationships  . Social Herbalist on phone: Not on file    Gets together: Not on file    Attends religious service: Not on file    Active member of club or organization: Not on file    Attends meetings of clubs or organizations: Not on file    Relationship status: Not on file  Other Topics Concern  . Not on file  Social History Narrative   09/13/17 Patient lives at home with spouse.   Has 68 yo daughter   No grandchildren   Family History  Problem Relation Age of Onset  . Hypertension Mother   . Diabetes Sister   . Hypertension  Sister   . Cancer Sister        1 sister  . COPD Sister        in 1 sister  . Stomach cancer Neg Hx   . Colon cancer Neg Hx   . Pancreatic cancer Neg Hx   . Esophageal cancer Neg Hx    Scheduled Meds: . aspirin EC  81 mg Oral Daily  . atorvastatin  40 mg Oral Daily  . azithromycin  500 mg Oral Daily  . brimonidine  1 drop Both Eyes Daily  . dorzolamide-timolol  1 drop Both Eyes Daily  . heparin  5,000 Units Subcutaneous Q8H  . hydrALAZINE  25 mg Oral TID  . lactulose  10 g Oral Daily  . latanoprost  1 drop Both Eyes QHS  . levETIRAcetam  500 mg Oral BID  . lip balm      . metoprolol tartrate  100 mg Oral BID  . tamsulosin  0.4 mg Oral Daily   Continuous Infusions: . sodium chloride 75 mL/hr at 05/07/19 1407  . cefTRIAXone (ROCEPHIN)  IV 200 mL/hr at 05/07/19 0534   PRN Meds:.acetaminophen **OR** acetaminophen, hydrALAZINE, ondansetron **OR** ondansetron (ZOFRAN) IV, polyethylene glycol, senna-docusate Allergies  Allergen Reactions  . Ace Inhibitors Other (See Comments)    Hyperkalemia  . Doxycycline Other (See Comments)    Hiccups, cough, nausea and emesis, elevated liver enzymes, elevated eosinophils, SOB concerning for early DRESS syndrome   . Atacand Hct [Candesartan Cilexetil-Hctz] Hives  . Shellfish Allergy Hives    Vital Signs: BP 90/61 (BP Location: Left Arm)   Pulse 65   Temp 98.7 F (37.1 C) (Oral)   Resp 15   Ht 6' 2.02" (1.88 m)   Wt 72.5 kg   SpO2 100%   BMI 20.51 kg/m  Pain Scale: 0-10   Pain Score: 0-No pain   SpO2: SpO2: 100 % O2 Device:SpO2: 100 % O2 Flow Rate: .O2 Flow Rate (L/min): 3 L/min  IO: Intake/output summary:   Intake/Output Summary (Last 24 hours) at 05/07/2019 1652 Last data filed at 05/07/2019 1346 Gross per 24 hour  Intake 2552.13 ml  Output 300 ml  Net 2252.13 ml    LBM: Last BM Date: 05/06/19 Baseline Weight: Weight: 68 kg Most recent weight: Weight: 72.5 kg     Palliative Assessment/Data:PPS 30%    The above  conversation was completed via telephone due to the visitor restrictions  during the COVID-19 pandemic. Thorough chart review and discussion with necessary members of the care team was completed as part of assessment. All issues were discussed and addressed but no physical exam was performed.  Time Total: 70 minutes Greater than 50%  of this time was spent counseling and coordinating care related to the above assessment and plan.  Juel Burrow, DNP, AGNP-C Palliative Medicine Team [phone] Pager: [phone]

## 2019-05-07 NOTE — Progress Notes (Signed)
PROGRESS NOTE    Darrell [name]  WGN:[id number] DOB: 1951-07-14 DOA: 05/05/2019 PCP: [name], MD   Brief Narrative: -68 Year-old with past medical history significant for motor neuron disease, seizure disorder, chronic kidney disease creatinine baseline 2.4, hypertension, almost bedbound who presents with a choking episode on, difficulty swallowing, vomiting. Evaluation in the ED: Creatinine 4.3, UA with features concerning for UTI.  Chest x-ray showed bilateral infiltrate in the bases.  COVID-19 test negative.  CT renal study showed some nonspecific findings in both kidneys, and constipation.  Assessment & Plan:   Principal Problem:   ARF (acute renal failure) ([name]) Active Problems:   HTN (hypertension)   Seizures ([name])   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[place])  1-Acute on chronic kidney disease a stage III: cr baseline 2.2 Agree with holding torsemide and lisinopril. Wife report poor oral intake over the last few days. Renal function improving with IV fluids. We will continue with IV fluids for 24 hours. Creatinine down to 3.3.   2-Dysphagia: With a history of motor neuron disease: Discussed with Dr. Idelle Jo, FVC 5L NIF -5  supportive care.  Monitor clinically. If metabolic encephalopathy related to acute illness patient might be able to stabilize to prior neurological status.  3-seizure: Continue with Keppra UTI: Continue with ceftriaxone follow urine culture  4-Abnormal lesion in both kidneys seen on CTA.  Will need to follow-up as an outpatient  5-Constipation: Had soapsuds enema.  Per wife patient had bowel movement.  6-Hypertension: Holding lisinopril due to AKI.  Continue with beta-blocker. We will reduce hydralazine to avoid hypotension 7-Anemia: Appears chronic disease 8-Previous history of stroke: Continue with a statin and aspirin    Estimated body mass index is 20.51 kg/m as calculated from the following:   Height as of this encounter: 6' 2.02" (1.88 m).  Weight as of this encounter: 72.5 kg.   DVT prophylaxis: Heparin Code Status: DNR Family Communication: Wife at bedside Disposition Plan: Continue with IV antibiotics, get PT OT.  Palliative care consulted Consultants:   Palliative care  Procedures:   None  Antimicrobials:  Ceftriaxone and azithromycin 8/12  Subjective: Patient was a sleepy on my evaluation, he wake up to voice, follows some commands.  Per wife he seems a little bit more alert.  Objective: Vitals:   05/06/19 1318 05/06/19 1351 05/07/19 0500 05/07/19 0616  BP: 92/65 92/65  94/62  Pulse: 61 61  61  Resp: 18 18  18   Temp: 98.4 F (36.9 C) 98.4 F (36.9 C)  98 F (36.7 C)  TempSrc: Oral Oral  Oral  SpO2: 100%   100%  Weight:  68 kg 72.5 kg   Height:  6' 2.02" (1.88 m)      Intake/Output Summary (Last 24 hours) at 05/07/2019 1023 Last data filed at 05/07/2019 0534 Gross per 24 hour  Intake 2630.3 ml  Output 300 ml  Net 2330.3 ml   Filed Weights   05/06/19 1351 05/07/19 0500  Weight: 68 kg 72.5 kg    Examination:  General exam: Appears calm and comfortable  Respiratory system: Clear to auscultation. Respiratory effort normal. Cardiovascular system: S1 & S2 heard, RRR. No JVD, murmurs, rubs, gallops or clicks. No pedal edema. Gastrointestinal system: Abdomen is nondistended, soft and nontender. No organomegaly or masses felt. Normal bowel sounds heard. Central nervous system: Alert, follows commands, left-sided weaker than right. Extremities: No edema Skin: No rashes, lesions or ulcers      Data Reviewed: I have personally reviewed following labs and  imaging studies  CBC: Recent Labs  Lab 05/05/19 1929 05/06/19 0440 05/07/19 0529  WBC 7.8 7.9 8.6  NEUTROABS 5.0  --  5.6  HGB 11.7* 10.7* 8.9*  HCT 38.1* 33.4* 28.7*  MCV 94.1 92.8 95.7  PLT 235 199 793   Basic Metabolic Panel: Recent Labs  Lab 05/05/19 1929 05/06/19 0440 05/07/19 0529  NA 139  --  139  K 4.3  --  4.6  CL 105   --  112*  CO2 23  --  20*  GLUCOSE 130*  --  117*  BUN 78*  --  71*  CREATININE 4.32* 3.69* 3.31*  CALCIUM 10.3  --  9.1  MG  --   --  1.9  PHOS  --   --  3.2   GFR: Estimated Creatinine Clearance: 22.2 mL/min (A) (by C-G formula based on SCr of 3.31 mg/dL (H)). Liver Function Tests: Recent Labs  Lab 05/05/19 1929 05/07/19 0529  AST 14*  --   ALT 9  --   ALKPHOS 78  --   BILITOT 0.5  --   PROT 7.8  --   ALBUMIN 4.2 2.7*   No results for input(s): LIPASE, AMYLASE in the last 168 hours. Recent Labs  Lab 05/05/19 2254  AMMONIA 21   Coagulation Profile: No results for input(s): INR, PROTIME in the last 168 hours. Cardiac Enzymes: No results for input(s): CKTOTAL, CKMB, CKMBINDEX, TROPONINI in the last 168 hours. BNP (last 3 results) No results for input(s): PROBNP in the last 8760 hours. HbA1C: No results for input(s): HGBA1C in the last 72 hours. CBG: No results for input(s): GLUCAP in the last 168 hours. Lipid Profile: No results for input(s): CHOL, HDL, LDLCALC, TRIG, CHOLHDL, LDLDIRECT in the last 72 hours. Thyroid Function Tests: Recent Labs    05/05/19 2007  TSH 1.251   Anemia Panel: Recent Labs    05/05/19 1929  VITAMINB12 400  FOLATE 7.7  FERRITIN 190  TIBC 248*  IRON 72  RETICCTPCT 1.3   Sepsis Labs: No results for input(s): PROCALCITON, LATICACIDVEN in the last 168 hours.  Recent Results (from the past 240 hour(s))  SARS Coronavirus 2 [facility] order, Performed in [facility] hospital lab) Nasopharyngeal Nasopharyngeal Swab     Status: None   Collection Time: 05/05/19  9:39 PM   Specimen: Nasopharyngeal Swab  Result Value Ref Range Status   SARS Coronavirus 2 NEGATIVE NEGATIVE Final    Comment: (NOTE) If result is NEGATIVE SARS-CoV-2 target nucleic acids are NOT DETECTED. The SARS-CoV-2 RNA is generally detectable in upper and lower  respiratory specimens during the acute phase of infection. The lowest  concentration of SARS-CoV-2 viral  copies this assay can detect is 250  copies / mL. A negative result does not preclude SARS-CoV-2 infection  and should not be used as the sole basis for treatment or other  patient management decisions.  A negative result may occur with  improper specimen collection / handling, submission of specimen other  than nasopharyngeal swab, presence of viral mutation(s) within the  areas targeted by this assay, and inadequate number of viral copies  (<250 copies / mL). A negative result must be combined with clinical  observations, patient history, and epidemiological information. If result is POSITIVE SARS-CoV-2 target nucleic acids are DETECTED. The SARS-CoV-2 RNA is generally detectable in upper and lower  respiratory specimens dur ing the acute phase of infection.  Positive  results are indicative of active infection with SARS-CoV-2.  Clinical  correlation  with patient history and other diagnostic information is  necessary to determine patient infection status.  Positive results do  not rule out bacterial infection or co-infection with other viruses. If result is PRESUMPTIVE POSTIVE SARS-CoV-2 nucleic acids MAY BE PRESENT.   A presumptive positive result was obtained on the submitted specimen  and confirmed on repeat testing.  While 2019 novel coronavirus  (SARS-CoV-2) nucleic acids may be present in the submitted sample  additional confirmatory testing may be necessary for epidemiological  and / or clinical management purposes  to differentiate between  SARS-CoV-2 and other Sarbecovirus currently known to infect humans.  If clinically indicated additional testing with an alternate test  methodology [phone]) is advised. The SARS-CoV-2 RNA is generally  detectable in upper and lower respiratory sp ecimens during the acute  phase of infection. The expected result is Negative. Fact Sheet for Patients:  StrictlyIdeas.no Fact Sheet for Healthcare  Providers: BankingDealers.co.za This test is not yet approved or cleared by the [place] FDA and has been authorized for detection and/or diagnosis of SARS-CoV-2 by FDA under an Emergency Use Authorization (EUA).  This EUA will remain in effect (meaning this test can be used) for the duration of the COVID-19 declaration under Section 564(b)(1) of the Act, 21 U.S.C. section 360bbb-3(b)(1), unless the authorization is terminated or revoked sooner. Performed at [facility], [place] [address] [place], [place] [address]   Culture, Urine     Status: Abnormal   Collection Time: 05/06/19  5:45 AM   Specimen: Urine, Random  Result Value Ref Range Status   Specimen Description   Final    URINE, RANDOM Performed at [place] [address] [place], [place] [address]    Special Requests   Final    NONE Performed at [facility], [place] [address] [place], [place] [address]    Culture (A)  Final    <10,000 COLONIES/mL INSIGNIFICANT GROWTH Performed at [facility] [address], [place],  [address]    Report Status 05/07/2019 FINAL  Final         Radiology Studies: Dg Chest Portable 1 View  Result Date: 05/05/2019 CLINICAL DATA:  Short of breath and cough EXAM: PORTABLE CHEST 1 VIEW COMPARISON:  10/29/2018 FINDINGS: Dual lead pacemaker unchanged in position. Heart size normal. Vascularity normal. Mild bibasilar airspace disease may represent atelectasis or pneumonia. This is similar to the prior study. No effusion. IMPRESSION: Mild bibasilar atelectasis/infiltrate. Electronically Signed   By: Franchot Gallo M.D.   On: 05/05/2019 19:49   Dg Swallowing Func-speech Pathology  Result Date: 05/06/2019 Objective Swallowing Evaluation: Type of Study: MBS-Modified Barium Swallow Study  Patient Details Name: KEYSHUN ELPERS MRN: [id number] Date of Birth: 1951-01-16 Today's Date: 05/06/2019 Time: SLP  Start Time (ACUTE ONLY): 1217 -SLP Stop Time (ACUTE ONLY): 1250 SLP Time Calculation (min) (ACUTE ONLY): 33 min Past Medical History: Past Medical History: Diagnosis Date  Arthritis   At high risk for falls 08/16/2015  Bradycardia   Chronic kidney disease   stage 3 GFR 30-59 ml/min   CIDP (chronic inflammatory demyelinating polyneuropathy) ([name])   CKD (chronic kidney disease) stage 3, GFR 30-59 ml/min ([place]) 08/16/2015  Dysphagia as late effect of cerebrovascular disease   pts wife states pt has to eat soft foods   Elevated liver enzymes 08/10/2016  GERD (gastroesophageal reflux disease)   Glaucoma   High cholesterol   History of CVA with residual deficit 03/25/2013  Hypertension  Hypertensive retinopathy of both eyes 01/16/2017  Inguinal hernia 03/25/2013  Liver hemangioma 08/14/2016  Neuromuscular disorder ([place])   chronic inflammatory demyelinating polyneuropathy   New onset seizure ([place]) 07/08/2017  seizure 07/14/18  Nuclear sclerosis of both eyes 10/25/2016  Presence of permanent cardiac pacemaker   placed in april 2018  Primary open angle glaucoma of both eyes, indeterminate stage 10/25/2016  Renal mass, right 08/14/2016  Status cardiac pacemaker 01/29/2017  Placed for second degree heart block on 01/16/17 Medtronic Azure XT DR MRI SureScan dual-chamber pacemaker  Stroke [facility])   2011 with residual deficit left sided weakness  Tobacco dependence  Past Surgical History: Past Surgical History: Procedure Laterality Date  EYE SURGERY    HERNIA REPAIR    INGUINAL HERNIA REPAIR Right 11/23/2014  Procedure: right inguinal hernia repair with mesh;  Surgeon: Armandina Gemma, MD;  Location: [facility];  Service: General;  Laterality: Right;  INSERTION OF MESH N/A 11/23/2014  Procedure: INSERTION OF MESH;  Surgeon: Armandina Gemma, MD;  Location: [facility];  Service: General;  Laterality: N/A;  MASS EXCISION Left 08/29/2017  Procedure: EXCISION OF LEFT NECK MASS;  Surgeon: Coralie Keens, MD;  Location: [place];   Service: General;  Laterality: Left;  PACEMAKER IMPLANT N/A 01/16/2017  Procedure: Pacemaker Implant;  Surgeon: Will Meredith Leeds, MD;  Location: [place] [facility];  Service: Cardiovascular;  Laterality: N/A;  SHOULDER SURGERY Bilateral 1988, 1998 HPI: 68 yo male with h/o neuromuscular dx *chronic inflammatory demyelinating polyneuropathy, stroke 2011 with residuals effects, glaucome, chronic kidney disease, tobacco dependence, high cholesterol and dysphagia.  Pt admitted after getting choked while eating lunch - was consuming BBQ.  Pt imaging of chest is negative.  Hiis wife is present and reports pt takes medicine with puree.  Subjective: pt awake in chair Assessment / Plan / Recommendation CHL IP CLINICAL IMPRESSIONS 05/06/2019 Clinical Impression Pt presents with primarily oral dysphagia due to weakness resulting in decreased coordination/timeliness/strength and transiting of boluses into pharynx.  Transiting is worse with solids/items of increased viscocity as pt tends to extend head back to aid oral transiting.  He did not aspirate and very mild laryngeal penetration of thin observed.  Pharyngeal swallow was strong without residuals fortuantely. At times pt spills to pyriform sinus prior to swallow intitation.  Suspect pt overtly aspirated on his solids due to level of oral control/strength deficits.  Educated wife and pt to findings/recommendations during session using teach back and video.  SLP will follow up for dysphagia management and education. SLP Visit Diagnosis Dysphagia, oral phase (R13.11) Attention and concentration deficit following -- Frontal lobe and executive function deficit following -- Impact on safety and function Moderate aspiration risk;Risk for inadequate nutrition/hydration   CHL IP TREATMENT RECOMMENDATION 05/06/2019 Treatment Recommendations Therapy as outlined in treatment plan below   Prognosis 05/06/2019 Prognosis for Safe Diet Advancement Fair Barriers to Reach Goals Time post  onset Barriers/Prognosis Comment -- CHL IP DIET RECOMMENDATION 05/06/2019 SLP Diet Recommendations Dysphagia 1 (Puree) solids;Thin liquid Liquid Administration via Straw;Cup Medication Administration Whole meds with puree Compensations Slow rate;Small sips/bites Postural Changes --   CHL IP OTHER RECOMMENDATIONS 05/06/2019 Recommended Consults -- Oral Care Recommendations Oral care before and after PO Other Recommendations --   CHL IP FOLLOW UP RECOMMENDATIONS 07/11/2017 Follow up Recommendations Inpatient Rehab   CHL IP FREQUENCY AND DURATION 05/06/2019 Speech Therapy Frequency (ACUTE ONLY) min 1 x/week Treatment Duration 1 week      CHL IP ORAL PHASE 05/06/2019 Oral Phase Impaired Oral - Pudding Teaspoon --  Oral - Pudding Cup -- Oral - Honey Teaspoon -- Oral - Honey Cup -- Oral - Nectar Teaspoon Weak lingual manipulation;Reduced posterior propulsion;Delayed oral transit;Decreased bolus cohesion Oral - Nectar Cup -- Oral - Nectar Straw Weak lingual manipulation;Reduced posterior propulsion;Delayed oral transit;Decreased bolus cohesion Oral - Thin Teaspoon Weak lingual manipulation;Reduced posterior propulsion;Delayed oral transit;Decreased bolus cohesion Oral - Thin Cup Weak lingual manipulation;Reduced posterior propulsion;Delayed oral transit;Decreased bolus cohesion Oral - Thin Straw Weak lingual manipulation;Reduced posterior propulsion;Delayed oral transit;Decreased bolus cohesion Oral - Puree Weak lingual manipulation;Reduced posterior propulsion;Lingual/palatal residue;Delayed oral transit Oral - Mech Soft -- Oral - Regular -- Oral - Multi-Consistency -- Oral - Pill -- Oral Phase - Comment --  CHL IP PHARYNGEAL PHASE 05/06/2019 Pharyngeal Phase Impaired Pharyngeal- Pudding Teaspoon -- Pharyngeal -- Pharyngeal- Pudding Cup -- Pharyngeal -- Pharyngeal- Honey Teaspoon -- Pharyngeal -- Pharyngeal- Honey Cup -- Pharyngeal -- Pharyngeal- Nectar Teaspoon WFL;Delayed swallow initiation-pyriform sinuses Pharyngeal  Material does not enter airway Pharyngeal- Nectar Cup -- Pharyngeal -- Pharyngeal- Nectar Straw WFL;Delayed swallow initiation-pyriform sinuses Pharyngeal Material does not enter airway Pharyngeal- Thin Teaspoon Delayed swallow initiation-pyriform sinuses;WFL Pharyngeal Material does not enter airway Pharyngeal- Thin Cup -- Pharyngeal -- Pharyngeal- Thin Straw WFL;Delayed swallow initiation-pyriform sinuses Pharyngeal Material does not enter airway Pharyngeal- Puree WFL;Delayed swallow initiation-vallecula Pharyngeal Material does not enter airway Pharyngeal- Mechanical Soft WFL;Delayed swallow initiation-vallecula Pharyngeal Material does not enter airway Pharyngeal- Regular -- Pharyngeal -- Pharyngeal- Multi-consistency -- Pharyngeal -- Pharyngeal- Pill WFL;Delayed swallow initiation-vallecula Pharyngeal Material does not enter airway Pharyngeal Comment barium tablet given with puree due to pt's worsening dysarthria/lingual coordination and concern for aspiration/penetration  CHL IP CERVICAL ESOPHAGEAL PHASE 05/06/2019 Cervical Esophageal Phase Impaired Pudding Teaspoon -- Pudding Cup -- Honey Teaspoon -- Honey Cup -- Nectar Teaspoon -- Nectar Cup -- Nectar Straw -- Thin Teaspoon -- Thin Cup -- Thin Straw -- Puree -- Mechanical Soft -- Regular -- Multi-consistency -- Pill -- Cervical Esophageal Comment -- Luanna Salk, MS [facility] SLP Acute Rehab Services Pager [phone] Office [phone] Macario Golds 05/06/2019, 1:53 PM              Ct Renal Stone Study  Result Date: 05/05/2019 CLINICAL DATA:  68 year old male with cough and flank pain. Concern for kidney stone. EXAM: CT ABDOMEN AND PELVIS WITHOUT CONTRAST TECHNIQUE: Multidetector CT imaging of the abdomen and pelvis was performed following the standard protocol without IV contrast. COMPARISON:  Abdominal MRI dated 08/23/2016 FINDINGS: Evaluation of this exam is limited in the absence of intravenous contrast. Evaluation is also limited due to  respiratory motion artifact. Lower chest: Mild bilateral lower lobe bronchiectasis. Apparent areas of hazy density in the lower lobes bilaterally, likely related to respiratory motion artifact. Clinical correlation is recommended to exclude developing infiltrate. Cardiac pacemaker wire noted. There is no intra-abdominal free air or free fluid. Hepatobiliary: A 2 cm hypodense lesion in the anterior left lobe of the liver which is not characterized on this CT but likely corresponds to the benign cyst or hemangioma described on the prior MRI. There is no intrahepatic biliary ductal dilatation. No calcified gallstone or pericholecystic fluid. Pancreas: Unremarkable. No pancreatic ductal dilatation or surrounding inflammatory changes. Spleen: Normal in size without focal abnormality. Adrenals/Urinary Tract: The adrenal glands are unremarkable. There is no hydronephrosis or nephrolithiasis on either side. Small foci of parenchymal calcification noted bilaterally. There is a 1.5 x 2.0 cm exophytic lesion from the posterior interpolar aspect of the left kidney with probable area of calcification or proteinaceous debris. This lesion  is not characterized. Additional smaller lesion along the medial interpolar left kidney is also not characterized. These lesions however appear similar to the prior MRI. There is a 2.5 cm left renal inferior pole cyst. Additional smaller right renal cysts noted. Further characterization of the renal lesions with MRI without and with contrast on a nonemergent basis recommended. The visualized ureters appear unremarkable. The urinary bladder is collapsed. There is apparent diffuse thickening of the bladder wall which may be partly related to underdistention. Cystitis is not excluded. Correlation with urinalysis recommended. Stomach/Bowel: Fluid and debris noted in the visualized distal esophagus likely representing reflux. There is moderate stool throughout the colon and large stool in the rectal  vault. Small scattered colonic diverticula without active inflammatory changes. There is no bowel obstruction. The appendix is normal. Vascular/Lymphatic: Advanced aortoiliac atherosclerotic disease. The IVC is unremarkable. No portal venous gas. There is no adenopathy. Reproductive: The prostate and seminal vesicles are grossly unremarkable. No pelvic mass. Other: None Musculoskeletal: Osteopenia and degenerative changes of the spine. No acute osseous pathology. IMPRESSION: 1. No acute intra-abdominal or pelvic pathology. No hydronephrosis or nephrolithiasis. 2. Indeterminate bilateral renal lesions. Further characterization with MRI without and with contrast on a nonemergent basis recommended. 3. Constipation with large stool in the rectal vault. No bowel obstruction. Normal appendix. 4. Colonic diverticulosis. 5. Aortic Atherosclerosis (ICD10-I70.0). Electronically Signed   By: Anner Crete M.D.   On: 05/05/2019 23:02        Scheduled Meds:  lip balm       aspirin EC  81 mg Oral Daily   atorvastatin  40 mg Oral Daily   brimonidine  1 drop Both Eyes Daily   dorzolamide-timolol  1 drop Both Eyes Daily   heparin  5,000 Units Subcutaneous Q8H   hydrALAZINE  100 mg Oral TID   lactulose  10 g Oral Daily   latanoprost  1 drop Both Eyes QHS   metoprolol tartrate  100 mg Oral BID   tamsulosin  0.4 mg Oral Daily   Continuous Infusions:  cefTRIAXone (ROCEPHIN)  IV 200 mL/hr at 05/07/19 0534   levETIRAcetam 500 mg (05/07/19 0737)     LOS: 1 day    Time spent: 35 minutes.     Elmarie Shiley, MD [facility] Pager [phone]  If 7PM-7AM, please contact night-coverage [email or web address] Password [facility] 05/07/2019, 10:23 AM

## 2019-05-07 NOTE — Progress Notes (Signed)
Pt has a poor effort doing the NIF and FVC

## 2019-05-07 NOTE — Progress Notes (Signed)
  Speech Language Pathology Treatment: Dysphagia  Patient Details Name: Darrell [name] MRN: [id number] DOB: 1951-02-28 Today's Date: 05/07/2019 Time: 0850-0910 SLP Time Calculation (min) (ACUTE ONLY): 20 min  Assessment / Plan / Recommendation Clinical Impression  Patient's wife at bedside feeding patient.  He is demonstrating good tolerance with clinical timely swallow and not indication of airway compromise.  Pt was being fed grits, sausage, water and milk by his wife.  Reviewed with spouse and pt findings of MBS yesterday = reviewing oral phase was more problematic for this patient than pharyngeal phase.  Reviewed pharyngeal phase of swallow is STRONG with no residuals.  Oral phase discoordinated with delayed transiting and decreased bolus cohesion. Using teach back, reviewed need to consume liquids before solids to allow maximal bolus propulsion due to amount of oral deficits.   Wife requesting recipes for Dys1/thin diet - will attempt to locate and see pt for RMST (expiratory) either this afternoon or tomorrow.   Education ongoing with wife and pt to decrease aspiration risk.  Advised pt to continue to work on strength of his cough for maximal airway protection. He and his wife reported understanding to information and wife able to teach back clinical reasoning.     HPI HPI: 68 yo male with h/o neuromuscular dx *chronic inflammatory demyelinating polyneuropathy, stroke 2011 with residuals effects, glaucome, chronic kidney disease, tobacco dependence, high cholesterol and dysphagia.  Pt admitted after getting choked while eating lunch - was consuming BBQ.  Pt imaging of chest is negative.  Hiis wife is present and reports pt takes medicine with puree.       SLP Plan  Continue with current plan of care       Recommendations  Diet recommendations: Dysphagia 1 (puree);Thin liquid Liquids provided via: Straw Medication Administration: Whole meds with puree Supervision: Patient able to self  feed Compensations: Slow rate;Small sips/bites Postural Changes and/or Swallow Maneuvers: Seated upright 90 degrees;Upright 30-60 min after meal                Oral Care Recommendations: Oral care BID SLP Visit Diagnosis: Dysphagia, oral phase (R13.11) Plan: Continue with current plan of care       GO                Macario Golds 05/07/2019, 9:15 AM   Luanna Salk, MS [facility] SLP Acute Rehab Services Pager [phone] Office [phone]

## 2019-05-07 NOTE — Progress Notes (Signed)
  Speech Language Pathology Treatment: Dysphagia  Patient Details Name: Darrell [name] MRN: [id number] DOB: Sep 30, 1950 Today's Date: 05/07/2019 Time: 1410-1440 SLP Time Calculation (min) (ACUTE ONLY): 30 min  Assessment / Plan / Recommendation Clinical Impression  SLP second visit to provide pt with RMST via [facility] PEP trainer set at 5 cmH20 pressure.  Pt able to perform adequate labial seal but able to perform exercise x1 of 5 adequately.  Pt's voice remains dysphonic with significant dysarthria.   Advised pt's spouse to have pt practice inhalation and blowing "fast and hard" before PEP trainer.  Pt able to speak loudly with max cues fortunately. Recommend pt have SLP follow up for dysphagia management - strengthening.  Wife is very supportive and thus advised if they do not seek OP SLP for dysphagia,  advised she modulate treatment when pt able to perform at 70% consistently over 5 days.    RN provided pt with medicine via applesauce - whole.  No indication of aspiration with puree.  Cough x1 with water provided by SLP.  Advised pt's wife to pH neutral status of water making it safest to consume. Reviewed importance of phonation strength, cough to aid airway clearance.    Wife was provided with recipes for puree foods and protein shakes.  Also educated wife and pt to importance of oral care - removing partial nightly and using non alcoholic and antibacterial products.    Questions answered to pt's wife satisfaction.  Also provided office number to wife if she has questions after discharge.    HPI HPI: 68 yo male with h/o neuromuscular dx *chronic inflammatory demyelinating polyneuropathy, stroke 2011 with residuals effects, glaucome, chronic kidney disease, tobacco dependence, high cholesterol and dysphagia.  Pt admitted after getting choked while eating lunch - was consuming BBQ.  Pt imaging of chest is negative.  Hiis wife is present and reports pt takes medicine with puree.        SLP Plan  Continue with current plan of care       Recommendations  Diet recommendations: Dysphagia 1 (puree);Thin liquid Liquids provided via: Straw Medication Administration: Whole meds with puree Supervision: Patient able to self feed Compensations: Slow rate;Small sips/bites Postural Changes and/or Swallow Maneuvers: Seated upright 90 degrees;Upright 30-60 min after meal                Oral Care Recommendations: Oral care BID Follow up Recommendations: Outpatient SLP(to address dysphagia) SLP Visit Diagnosis: Dysphagia, oral phase (R13.11) Plan: Continue with current plan of care       GO                Macario Golds 05/07/2019, 2:42 PM  Luanna Salk, MS [facility] SLP Acute Rehab Services Pager [phone] Office [phone]

## 2019-05-08 ENCOUNTER — Telehealth: Payer: Self-pay | Admitting: Family Medicine

## 2019-05-08 LAB — PROTEIN ELECTROPHORESIS, SERUM
A/G Ratio: 1.3 (ref 0.7–1.7)
Albumin ELP: 2.8 g/dL — ABNORMAL LOW (ref 2.9–4.4)
Alpha-1-Globulin: 0.2 g/dL (ref 0.0–0.4)
Alpha-2-Globulin: 0.7 g/dL (ref 0.4–1.0)
Beta Globulin: 0.5 g/dL — ABNORMAL LOW (ref 0.7–1.3)
Gamma Globulin: 0.7 g/dL (ref 0.4–1.8)
Globulin, Total: 2.1 g/dL — ABNORMAL LOW (ref 2.2–3.9)
Total Protein ELP: 4.9 g/dL — ABNORMAL LOW (ref 6.0–8.5)

## 2019-05-08 LAB — CBC
HCT: 27.2 % — ABNORMAL LOW (ref 39.0–52.0)
Hemoglobin: 8.3 g/dL — ABNORMAL LOW (ref 13.0–17.0)
MCH: 29.2 pg (ref 26.0–34.0)
MCHC: 30.5 g/dL (ref 30.0–36.0)
MCV: 95.8 fL (ref 80.0–100.0)
Platelets: 180 10*3/uL (ref 150–400)
RBC: 2.84 MIL/uL — ABNORMAL LOW (ref 4.22–5.81)
RDW: 13.4 % (ref 11.5–15.5)
WBC: 8.6 10*3/uL (ref 4.0–10.5)
nRBC: 0 % (ref 0.0–0.2)

## 2019-05-08 LAB — BASIC METABOLIC PANEL
Anion gap: 5 (ref 5–15)
BUN: 64 mg/dL — ABNORMAL HIGH (ref 8–23)
CO2: 21 mmol/L — ABNORMAL LOW (ref 22–32)
Calcium: 9.2 mg/dL (ref 8.9–10.3)
Chloride: 112 mmol/L — ABNORMAL HIGH (ref 98–111)
Creatinine, Ser: 2.78 mg/dL — ABNORMAL HIGH (ref 0.61–1.24)
GFR calc Af Amer: 26 mL/min — ABNORMAL LOW (ref >60)
GFR calc non Af Amer: 23 mL/min — ABNORMAL LOW (ref >60)
Glucose, Bld: 91 mg/dL (ref 70–99)
Potassium: 4.5 mmol/L (ref 3.5–5.1)
Sodium: 138 mmol/L (ref 135–145)

## 2019-05-08 MED ORDER — AMOXICILLIN-POT CLAVULANATE 875-125 MG PO TABS: 1.0000 | ORAL_TABLET | Freq: Two times a day (BID) | ORAL | 0 refills | Status: AC

## 2019-05-08 MED ORDER — METOPROLOL TARTRATE 50 MG PO TABS: 50.0000 mg | ORAL_TABLET | Freq: Two times a day (BID) | ORAL | 0 refills | Status: DC

## 2019-05-08 MED ORDER — METOPROLOL TARTRATE 50 MG PO TABS: 50.0000 mg | ORAL_TABLET | Freq: Two times a day (BID) | ORAL | Status: DC

## 2019-05-08 MED ORDER — FERROUS SULFATE 325 (65 FE) MG PO TABS: 325.0000 mg | ORAL_TABLET | Freq: Two times a day (BID) | ORAL | Status: DC

## 2019-05-08 MED ORDER — FERROUS SULFATE 325 (65 FE) MG PO TABS: 325.0000 mg | ORAL_TABLET | Freq: Two times a day (BID) | ORAL | 3 refills | Status: DC

## 2019-05-08 MED ORDER — AZITHROMYCIN 250 MG PO TABS: ORAL_TABLET | ORAL | 0 refills | Status: DC

## 2019-05-08 NOTE — Telephone Encounter (Signed)
Will route to PCP for review. 

## 2019-05-08 NOTE — Progress Notes (Signed)
Occupational Therapy Evaluation Patient Details Name: Darrell [name] MRN: [id number] DOB: 1951/06/05 Today's Date: 05/08/2019    History of Present Illness 68 yo male admitted to ED on 8/10 with difficulty swallowing, cough, mucus plug. Pt with history of motor neuron disease (chronic inflammatory demyelinating polyneuropathy), CVA with residual L paresis, OA, CKD, HTN, pacemaker.   Clinical Impression   Pt received with above diagnosis. PTA pt PLOF requiring assistance in all ADLs with limitation of L due to prior CVA. Pt lives with spouse who is the current CG. Pt currently requires assistance in ADLs due to weakness, limited ROM, and neurological deficits (impaired core strength for sitting tolerance). Pt will benefit from continued acute OT to address deficits with caregiver education, safe ADL engagement, and NDT. DC recommendation to [place].     Follow Up Recommendations  Home health OT;Supervision/Assistance - 24 hour    Equipment Recommendations  Tub/shower bench    Recommendations for Other Services       Precautions / Restrictions Precautions Precautions: Fall Restrictions Weight Bearing Restrictions: No      Mobility Bed Mobility Overal bed mobility: Needs Assistance                Transfers Overall transfer level: Needs assistance                    Balance Overall balance assessment: Needs assistance;History of Falls Sitting-balance support: Bilateral upper extremity supported Sitting balance-Leahy Scale: Fair Sitting balance - Comments: able to sit for short periods unsupported, PT at times assisting pt in postural correction and UE propping. Postural control: Posterior lean Standing balance support: Bilateral upper extremity supported Standing balance-Leahy Scale: Poor Standing balance comment: reliant on UE support and PT for stability                           ADL either performed or assessed with clinical judgement   ADL Overall  ADL's : Needs assistance/impaired Eating/Feeding: Maximal assistance;Bed level   Grooming: Moderate assistance;Bed level   Upper Body Bathing: Maximal assistance   Lower Body Bathing: Maximal assistance   Upper Body Dressing : Maximal assistance   Lower Body Dressing: Maximal assistance                 General ADL Comments: Pt requires max A in most ADLs due to weakness and limited core strength for sitting tolerance.      Vision         Perception     Praxis      Pertinent Vitals/Pain Pain Assessment: No/denies pain     Hand Dominance Right   Extremity/Trunk Assessment Upper Extremity Assessment Upper Extremity Assessment: LUE deficits/detail LUE Deficits / Details: limited in strength and ROM. R hand contractures.   Lower Extremity Assessment Lower Extremity Assessment: Defer to PT evaluation LLE Deficits / Details: history of CVA with L sided residual deficits. Bilaterally, pt is very weak   Cervical / Trunk Assessment Cervical / Trunk Assessment: Kyphotic   Communication Communication Communication: Other (comment)(pt very softspoken and speaks slowly)   Cognition Arousal/Alertness: Awake/alert(somewhat drowsy, speaks with PT) Behavior During Therapy: WFL for tasks assessed/performed Overall Cognitive Status: Within Functional Limits for tasks assessed Area of Impairment: Problem solving;Following commands                       Following Commands: Follows one step commands consistently;Follows one step commands with increased time  Problem Solving: Slow processing;Decreased initiation;Difficulty sequencing;Requires tactile cues;Requires verbal cues General Comments: pt follows one step commands with increased time, has a good understanding of his physical deficits   General Comments  Caregiver education is imperative to ensure safe assistance in home setting. Pt given built up hand towels to improve ROM of L hand.     Exercises      Shoulder Instructions      Home Living Family/patient expects to be discharged to:: Private residence Living Arrangements: Spouse/significant other Available Help at Discharge: Family;Available 24 hours/day Type of Home: House Home Access: Ramped entrance     Home Layout: One level     Bathroom Shower/Tub: Teacher, early years/pre: Standard     Home Equipment: Environmental consultant - 2 wheels;Wheelchair - Education officer, community - power;Bedside commode;Shower seat   Additional Comments: Spouse reports having a hoyer lift recently delivered to the home.       Prior Functioning/Environment Level of Independence: Needs assistance  Gait / Transfers Assistance Needed: Pt uses RW for short distance ambulation in the house. Pt's wife assists him with all transfers into and out of wheelchair ADL's / [place] Needed: wife assists with dressing, bating, cooking, cleaning.            OT Problem List: Decreased strength;Decreased range of motion;Decreased activity tolerance;Impaired balance (sitting and/or standing);Decreased safety awareness;Decreased cognition;Decreased knowledge of use of DME or AE;Impaired tone      OT Treatment/Interventions: Self-care/ADL training;Therapeutic exercise;Neuromuscular education;DME and/or AE instruction;Manual therapy;Therapeutic activities;Cognitive remediation/compensation;Patient/family education;Balance training    OT Goals(Current goals can be found in the care plan section) Acute Rehab OT Goals Patient Stated Goal: be more steady OT Goal Formulation: With family Time For Goal Achievement: 05/22/19 Potential to Achieve Goals: Fair  OT Frequency: Min 2X/week   Barriers to D/C:            Co-evaluation              AM-PAC OT "6 Clicks" Daily Activity     Outcome Measure Help from another person eating meals?: A Lot Help from another person taking care of personal grooming?: A Lot Help from another person toileting, which  includes using toliet, bedpan, or urinal?: Total Help from another person bathing (including washing, rinsing, drying)?: A Lot Help from another person to put on and taking off regular upper body clothing?: A Lot Help from another person to put on and taking off regular lower body clothing?: A Lot 6 Click Score: 11   End of Session Nurse Communication: Mobility status  Activity Tolerance: Patient tolerated treatment well Patient left: in bed;with bed alarm set;with nursing/sitter in room;with family/visitor present  OT Visit Diagnosis: Unsteadiness on feet (R26.81);Muscle weakness (generalized) (M62.81)                Time: 4403-4742 OT Time Calculation (min): 17 min Charges:  OT General Charges $OT Visit: 1 Visit OT Evaluation $OT Eval Low Complexity: [place], MSOT, OTR/L  Supplemental Rehabilitation Services  [phone]   Darrell [name] 05/08/2019, 9:48 AM

## 2019-05-08 NOTE — Progress Notes (Signed)
Hydrologist [facility])  Hospital Liaison: RN note  Notified by Kathie Rhodes, NP of patient/family request for [facility] Palliative services at home after discharge.   Writer spoke with wife, [name] to initiate education related to [place] verbalized understanding of information given. Per discussion, plan is for the [facility] to follow up with Hoyle Sauer and schedule an appointment after patient discharges home.    Please call with any palliative related questions.  Thank you for this referral.     Farrel Gordon, RN, CCM  [place] (listed on [facility] under [facility] [place] [facility] [place])  [phone]

## 2019-05-08 NOTE — Telephone Encounter (Signed)
Darrell [name] with [facility] is calling to inform Dr. Margarita Rana they received a referral from the hospital to provide pallative support for pt when he is discharged. If any question done hesitate to call. Please f/u

## 2019-05-08 NOTE — Discharge Summary (Signed)
Physician Discharge Summary  Darrell [name] YTK:[id number] DOB: [birth year] DOA: 05/05/2019  PCP: [name], MD  Admit date: 05/05/2019 Discharge date: 05/08/2019  Admitted From: Home Disposition:  Home   Recommendations for Outpatient Follow-up:  1. Follow up with PCP in 1-2 weeks 2. Please obtain BMP/CBC in one week 3. Home with [facility] 4. Palliative at home 5. Further evaluation and discussion for care with neurology  Home Health: yes  Discharge Condition: stable.  CODE STATUS: DNR Diet recommendation: Dysphagia   Brief/Interim Summary: 68 Year-old with past medical history significant for motor neuron disease, seizure disorder, chronic kidney disease creatinine baseline 2.4, hypertension, almost bedbound who presents with a choking episode on, difficulty swallowing, vomiting. Evaluation in the ED: Creatinine 4.3, UA with features concerning for UTI.  Chest x-ray showed bilateral infiltrate in the bases.  COVID-19 test negative.  CT renal study showed some nonspecific findings in both kidneys, and constipation.   1-Acute on chronic kidney disease a stage III: cr baseline 2.2 Agree with holding torsemide and lisinopril. Wife report poor oral intake over the last few days. Renal function improving with IV fluids. Creatinine down to 2.7 close to baseline.  Improved , hold torsemide and ACE at discharge.  Close follow up with nephrology   2-Dysphagia: With a history of motor neuron disease: Discussed with Dr. Idelle Jo, FVC 5L NIF -5  supportive care.  Monitor clinically. If metabolic encephalopathy related to acute illness patient might be able to stabilize to prior neurological status.  3-seizure: Continue with Keppra PNA ? UTI: Treated with ceftriaxone and azithromycin while in patient.  Discharge on Augmentin and azithrom  4-Abnormal lesion in both kidneys seen on CTA.  Will need to follow-up as an outpatient  5-Constipation: Had soapsuds enema.  Per wife patient had  bowel movement.  6-Hypertension: Holding lisinopril due to AKI.  will hold metoprolol, hydralazine SBP has been 100 range.    7-Anemia: Appears chronic disease. Started  ferous sulfate.  Follow with nephrology for evaluation of aranesp.   8-Previous history of stroke: Continue with a statin and aspirin   Discharge Diagnoses:  Principal Problem:   ARF (acute renal failure) ([place]) Active Problems:   HTN (hypertension)   Seizures ([name])   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[place])   Goals of care, counseling/discussion   Palliative care by specialist    Discharge Instructions  Discharge Instructions    Diet - low sodium heart healthy   Complete by: As directed    Increase activity slowly   Complete by: As directed      Allergies as of 05/08/2019      Reactions   Ace Inhibitors Other (See Comments)   Hyperkalemia   Doxycycline Other (See Comments)   Hiccups, cough, nausea and emesis, elevated liver enzymes, elevated eosinophils, SOB concerning for early DRESS syndrome    Atacand Hct [candesartan Cilexetil-hctz] Hives   Shellfish Allergy Hives      Medication List    STOP taking these medications   bacitracin 500 UNIT/GM ointment   cetirizine 10 MG tablet Commonly known as: ZYRTEC   folic acid 1 MG tablet Commonly known as: FOLVITE   hydrALAZINE 100 MG tablet Commonly known as: APRESOLINE   ketoconazole 2 % cream Commonly known as: NIZORAL   lisinopril 10 MG tablet Commonly known as: ZESTRIL   metoprolol tartrate 100 MG tablet Commonly known as: LOPRESSOR   thiamine 100 MG tablet   torsemide 20 MG tablet Commonly known as: DEMADEX     TAKE  these medications   amoxicillin-clavulanate 875-125 MG tablet Commonly known as: Augmentin Take 1 tablet by mouth 2 (two) times daily for 4 days.   aspirin EC 81 MG tablet Take 1 tablet (81 mg total) by mouth daily.   atorvastatin 40 MG tablet Commonly known as: LIPITOR Take 1 tablet (40 mg total) by mouth daily.    azithromycin 250 MG tablet Commonly known as: ZITHROMAX Take one tablet daily for 3 days. Start taking on: May 09, 2019   brimonidine 0.1 % Soln Commonly known as: ALPHAGAN P Place 1 drop into both eyes daily.   Daily Vitamin Formula+Iron Tabs Take 1 tablet by mouth daily.   dorzolamide-timolol 22.3-6.8 MG/ML ophthalmic solution Commonly known as: COSOPT Place 1 drop into both eyes daily.   ferrous sulfate 325 (65 FE) MG tablet Take 1 tablet (325 mg total) by mouth 2 (two) times daily with a meal.   fluticasone 50 MCG/ACT nasal spray Commonly known as: FLONASE Place 2 sprays into both nostrils daily. What changed:   when to take this  reasons to take this   lactulose 10 GM/15ML solution Commonly known as: CHRONULAC Take 15 mLs (10 g total) by mouth daily.   latanoprost 0.005 % ophthalmic solution Commonly known as: XALATAN Place 1 drop into both eyes at bedtime.   levETIRAcetam 500 MG tablet Commonly known as: KEPPRA Take 1 tablet (500 mg total) by mouth 2 (two) times daily.   Misc. Devices Misc Donut cushion. Dx- Decubitus ulcer of Sacrum   polyethylene glycol 17 g packet Commonly known as: MIRALAX / GLYCOLAX Take 17 g by mouth daily as needed for mild constipation.   SENNA S PO Take 1 tablet by mouth daily as needed (constipation).   tamsulosin 0.4 MG Caps capsule Commonly known as: FLOMAX Take 1 capsule (0.4 mg total) by mouth daily.   terbinafine 1 % cream Commonly known as: LamISIL AT Apply 1 application topically 2 (two) times daily. To R foot   Vitamin D (Ergocalciferol) 1.25 MG (50000 UT) Caps capsule Commonly known as: DRISDOL Take 1 capsule by mouth once a week            Durable Medical Equipment  (From admission, onward)         Start     Ordered   05/08/19 0958  For home use only DME Shower stool  Once    Comments: Shower bench   05/08/19 0957   05/07/19 1105  For home use only DME Hospital bed  Once    Question Answer  Comment  Length of Need Lifetime   The above medical condition requires: Patient requires the ability to reposition frequently   Head must be elevated greater than: 45 degrees   Bed type Semi-electric      05/07/19 1104   05/07/19 1105  For home use only DME Suction  Once    Question:  Suction  Answer:  Oral   05/07/19 1104          Allergies  Allergen Reactions  . Ace Inhibitors Other (See Comments)    Hyperkalemia  . Doxycycline Other (See Comments)    Hiccups, cough, nausea and emesis, elevated liver enzymes, elevated eosinophils, SOB concerning for early DRESS syndrome   . Atacand Hct [Candesartan Cilexetil-Hctz] Hives  . Shellfish Allergy Hives    Consultations: Neurology over phone  Procedures/Studies: Dg Chest Portable 1 View  Result Date: 05/05/2019 CLINICAL DATA:  Short of breath and cough EXAM: PORTABLE CHEST 1 VIEW COMPARISON:  10/29/2018 FINDINGS: Dual lead pacemaker unchanged in position. Heart size normal. Vascularity normal. Mild bibasilar airspace disease may represent atelectasis or pneumonia. This is similar to the prior study. No effusion. IMPRESSION: Mild bibasilar atelectasis/infiltrate. Electronically Signed   By: Franchot Gallo M.D.   On: 05/05/2019 19:49   Dg Swallowing Func-speech Pathology  Result Date: 05/06/2019 Objective Swallowing Evaluation: Type of Study: MBS-Modified Barium Swallow Study  Patient Details Name: JAMARIOUS FEBO MRN: [id number] Date of Birth: [birth year] Today's Date: 05/06/2019 Time: SLP Start Time (ACUTE ONLY): 1217 -SLP Stop Time (ACUTE ONLY): 1250 SLP Time Calculation (min) (ACUTE ONLY): 33 min Past Medical History: Past Medical History: Diagnosis Date . Arthritis  . At high risk for falls 08/16/2015 . Bradycardia  . Chronic kidney disease   stage 3 GFR 30-59 ml/min  . CIDP (chronic inflammatory demyelinating polyneuropathy) ([place])  . CKD (chronic kidney disease) stage 3, GFR 30-59 ml/min ([name]) 08/16/2015 . Dysphagia as late effect of  cerebrovascular disease   pts wife states pt has to eat soft foods  . Elevated liver enzymes 08/10/2016 . GERD (gastroesophageal reflux disease)  . Glaucoma  . High cholesterol  . History of CVA with residual deficit 03/25/2013 . Hypertension  . Hypertensive retinopathy of both eyes 01/16/2017 . Inguinal hernia 03/25/2013 . Liver hemangioma 08/14/2016 . Neuromuscular disorder ([place])   chronic inflammatory demyelinating polyneuropathy  . New onset seizure ([place]) 07/08/2017  seizure 07/14/18 . Nuclear sclerosis of both eyes 10/25/2016 . Presence of permanent cardiac pacemaker   placed in april 2018 . Primary open angle glaucoma of both eyes, indeterminate stage 10/25/2016 . Renal mass, right 08/14/2016 . Status cardiac pacemaker 01/29/2017  Placed for second degree heart block on 01/16/17 Medtronic Azure XT DR MRI SureScan dual-chamber pacemaker . Stroke [facility]   2011 with residual deficit left sided weakness . Tobacco dependence  Past Surgical History: Past Surgical History: Procedure Laterality Date . EYE SURGERY   . HERNIA REPAIR   . INGUINAL HERNIA REPAIR Right 11/23/2014  Procedure: right inguinal hernia repair with mesh;  Surgeon: Armandina Gemma, MD;  Location: [facility];  Service: General;  Laterality: Right; . INSERTION OF MESH N/A 11/23/2014  Procedure: INSERTION OF MESH;  Surgeon: Armandina Gemma, MD;  Location: [facility];  Service: General;  Laterality: N/A; . MASS EXCISION Left 08/29/2017  Procedure: EXCISION OF LEFT NECK MASS;  Surgeon: Coralie Keens, MD;  Location: [facility];  Service: General;  Laterality: Left; . PACEMAKER IMPLANT N/A 01/16/2017  Procedure: Pacemaker Implant;  Surgeon: Will Meredith Leeds, MD;  Location: [facility];  Service: Cardiovascular;  Laterality: N/A; . SHOULDER SURGERY Bilateral 1988, 1998 HPI: 68 yo male with h/o neuromuscular dx *chronic inflammatory demyelinating polyneuropathy, stroke 2011 with residuals effects, glaucome, chronic kidney disease, tobacco dependence, high cholesterol and  dysphagia.  Pt admitted after getting choked while eating lunch - was consuming BBQ.  Pt imaging of chest is negative.  Hiis wife is present and reports pt takes medicine with puree.  Subjective: pt awake in chair Assessment / Plan / Recommendation CHL IP CLINICAL IMPRESSIONS 05/06/2019 Clinical Impression Pt presents with primarily oral dysphagia due to weakness resulting in decreased coordination/timeliness/strength and transiting of boluses into pharynx.  Transiting is worse with solids/items of increased viscocity as pt tends to extend head back to aid oral transiting.  He did not aspirate and very mild laryngeal penetration of thin observed.  Pharyngeal swallow was strong without residuals fortuantely. At times pt spills to pyriform sinus prior to swallow intitation.  Suspect pt overtly aspirated on his solids due to level of oral control/strength deficits.  Educated wife and pt to findings/recommendations during session using teach back and video.  SLP will follow up for dysphagia management and education. SLP Visit Diagnosis Dysphagia, oral phase (R13.11) Attention and concentration deficit following -- Frontal lobe and executive function deficit following -- Impact on safety and function Moderate aspiration risk;Risk for inadequate nutrition/hydration   CHL IP TREATMENT RECOMMENDATION 05/06/2019 Treatment Recommendations Therapy as outlined in treatment plan below   Prognosis 05/06/2019 Prognosis for Safe Diet Advancement Fair Barriers to Reach Goals Time post onset Barriers/Prognosis Comment -- CHL IP DIET RECOMMENDATION 05/06/2019 SLP Diet Recommendations Dysphagia 1 (Puree) solids;Thin liquid Liquid Administration via Straw;Cup Medication Administration Whole meds with puree Compensations Slow rate;Small sips/bites Postural Changes --   CHL IP OTHER RECOMMENDATIONS 05/06/2019 Recommended Consults -- Oral Care Recommendations Oral care before and after PO Other Recommendations --   CHL IP FOLLOW UP  RECOMMENDATIONS 07/11/2017 Follow up Recommendations Inpatient Rehab   CHL IP FREQUENCY AND DURATION 05/06/2019 Speech Therapy Frequency (ACUTE ONLY) min 1 x/week Treatment Duration 1 week      CHL IP ORAL PHASE 05/06/2019 Oral Phase Impaired Oral - Pudding Teaspoon -- Oral - Pudding Cup -- Oral - Honey Teaspoon -- Oral - Honey Cup -- Oral - Nectar Teaspoon Weak lingual manipulation;Reduced posterior propulsion;Delayed oral transit;Decreased bolus cohesion Oral - Nectar Cup -- Oral - Nectar Straw Weak lingual manipulation;Reduced posterior propulsion;Delayed oral transit;Decreased bolus cohesion Oral - Thin Teaspoon Weak lingual manipulation;Reduced posterior propulsion;Delayed oral transit;Decreased bolus cohesion Oral - Thin Cup Weak lingual manipulation;Reduced posterior propulsion;Delayed oral transit;Decreased bolus cohesion Oral - Thin Straw Weak lingual manipulation;Reduced posterior propulsion;Delayed oral transit;Decreased bolus cohesion Oral - Puree Weak lingual manipulation;Reduced posterior propulsion;Lingual/palatal residue;Delayed oral transit Oral - Mech Soft -- Oral - Regular -- Oral - Multi-Consistency -- Oral - Pill -- Oral Phase - Comment --  CHL IP PHARYNGEAL PHASE 05/06/2019 Pharyngeal Phase Impaired Pharyngeal- Pudding Teaspoon -- Pharyngeal -- Pharyngeal- Pudding Cup -- Pharyngeal -- Pharyngeal- Honey Teaspoon -- Pharyngeal -- Pharyngeal- Honey Cup -- Pharyngeal -- Pharyngeal- Nectar Teaspoon WFL;Delayed swallow initiation-pyriform sinuses Pharyngeal Material does not enter airway Pharyngeal- Nectar Cup -- Pharyngeal -- Pharyngeal- Nectar Straw WFL;Delayed swallow initiation-pyriform sinuses Pharyngeal Material does not enter airway Pharyngeal- Thin Teaspoon Delayed swallow initiation-pyriform sinuses;WFL Pharyngeal Material does not enter airway Pharyngeal- Thin Cup -- Pharyngeal -- Pharyngeal- Thin Straw WFL;Delayed swallow initiation-pyriform sinuses Pharyngeal Material does not enter airway  Pharyngeal- Puree WFL;Delayed swallow initiation-vallecula Pharyngeal Material does not enter airway Pharyngeal- Mechanical Soft WFL;Delayed swallow initiation-vallecula Pharyngeal Material does not enter airway Pharyngeal- Regular -- Pharyngeal -- Pharyngeal- Multi-consistency -- Pharyngeal -- Pharyngeal- Pill WFL;Delayed swallow initiation-vallecula Pharyngeal Material does not enter airway Pharyngeal Comment barium tablet given with puree due to pt's worsening dysarthria/lingual coordination and concern for aspiration/penetration  CHL IP CERVICAL ESOPHAGEAL PHASE 05/06/2019 Cervical Esophageal Phase Impaired Pudding Teaspoon -- Pudding Cup -- Honey Teaspoon -- Honey Cup -- Nectar Teaspoon -- Nectar Cup -- Nectar Straw -- Thin Teaspoon -- Thin Cup -- Thin Straw -- Puree -- Mechanical Soft -- Regular -- Multi-consistency -- Pill -- Cervical Esophageal Comment -- Luanna Salk, MS [facility] SLP Acute Rehab Services Pager [phone] Office [phone] Macario Golds 05/06/2019, 1:53 PM              Ct Renal Stone Study  Result Date: 05/05/2019 CLINICAL DATA:  68 year old male with cough and flank pain. Concern for kidney stone. EXAM: CT ABDOMEN AND  PELVIS WITHOUT CONTRAST TECHNIQUE: Multidetector CT imaging of the abdomen and pelvis was performed following the standard protocol without IV contrast. COMPARISON:  Abdominal MRI dated 08/23/2016 FINDINGS: Evaluation of this exam is limited in the absence of intravenous contrast. Evaluation is also limited due to respiratory motion artifact. Lower chest: Mild bilateral lower lobe bronchiectasis. Apparent areas of hazy density in the lower lobes bilaterally, likely related to respiratory motion artifact. Clinical correlation is recommended to exclude developing infiltrate. Cardiac pacemaker wire noted. There is no intra-abdominal free air or free fluid. Hepatobiliary: A 2 cm hypodense lesion in the anterior left lobe of the liver which is not characterized on this CT  but likely corresponds to the benign cyst or hemangioma described on the prior MRI. There is no intrahepatic biliary ductal dilatation. No calcified gallstone or pericholecystic fluid. Pancreas: Unremarkable. No pancreatic ductal dilatation or surrounding inflammatory changes. Spleen: Normal in size without focal abnormality. Adrenals/Urinary Tract: The adrenal glands are unremarkable. There is no hydronephrosis or nephrolithiasis on either side. Small foci of parenchymal calcification noted bilaterally. There is a 1.5 x 2.0 cm exophytic lesion from the posterior interpolar aspect of the left kidney with probable area of calcification or proteinaceous debris. This lesion is not characterized. Additional smaller lesion along the medial interpolar left kidney is also not characterized. These lesions however appear similar to the prior MRI. There is a 2.5 cm left renal inferior pole cyst. Additional smaller right renal cysts noted. Further characterization of the renal lesions with MRI without and with contrast on a nonemergent basis recommended. The visualized ureters appear unremarkable. The urinary bladder is collapsed. There is apparent diffuse thickening of the bladder wall which may be partly related to underdistention. Cystitis is not excluded. Correlation with urinalysis recommended. Stomach/Bowel: Fluid and debris noted in the visualized distal esophagus likely representing reflux. There is moderate stool throughout the colon and large stool in the rectal vault. Small scattered colonic diverticula without active inflammatory changes. There is no bowel obstruction. The appendix is normal. Vascular/Lymphatic: Advanced aortoiliac atherosclerotic disease. The IVC is unremarkable. No portal venous gas. There is no adenopathy. Reproductive: The prostate and seminal vesicles are grossly unremarkable. No pelvic mass. Other: None Musculoskeletal: Osteopenia and degenerative changes of the spine. No acute osseous  pathology. IMPRESSION: 1. No acute intra-abdominal or pelvic pathology. No hydronephrosis or nephrolithiasis. 2. Indeterminate bilateral renal lesions. Further characterization with MRI without and with contrast on a nonemergent basis recommended. 3. Constipation with large stool in the rectal vault. No bowel obstruction. Normal appendix. 4. Colonic diverticulosis. 5. Aortic Atherosclerosis (ICD10-I70.0). Electronically Signed   By: Anner Crete M.D.   On: 05/05/2019 23:02     Subjective: Alert, answer question.  improved  Discharge Exam: Vitals:   05/08/19 0929 05/08/19 1339  BP: 107/72 95/63  Pulse: 63 62  Resp:  18  Temp:  98.1 F (36.7 C)  SpO2:  100%     General: Pt is alert, awake, not in acute distress Cardiovascular: RRR, S1/S2 +, no rubs, no gallops Respiratory: CTA bilaterally, no wheezing, no rhonchi Abdominal: Soft, NT, ND, bowel sounds + Extremities: no edema, no cyanosis    The results of significant diagnostics from this hospitalization (including imaging, microbiology, ancillary and laboratory) are listed below for reference.     Microbiology: Recent Results (from the past 240 hour(s))  SARS Coronavirus 2 [facility] order, Performed in [facility] hospital lab) Nasopharyngeal Nasopharyngeal Swab     Status: None   Collection Time: 05/05/19  9:39  PM   Specimen: Nasopharyngeal Swab  Result Value Ref Range Status   SARS Coronavirus 2 NEGATIVE NEGATIVE Final    Comment: (NOTE) If result is NEGATIVE SARS-CoV-2 target nucleic acids are NOT DETECTED. The SARS-CoV-2 RNA is generally detectable in upper and lower  respiratory specimens during the acute phase of infection. The lowest  concentration of SARS-CoV-2 viral copies this assay can detect is 250  copies / mL. A negative result does not preclude SARS-CoV-2 infection  and should not be used as the sole basis for treatment or other  patient management decisions.  A negative result may occur with  improper  specimen collection / handling, submission of specimen other  than nasopharyngeal swab, presence of viral mutation(s) within the  areas targeted by this assay, and inadequate number of viral copies  (<250 copies / mL). A negative result must be combined with clinical  observations, patient history, and epidemiological information. If result is POSITIVE SARS-CoV-2 target nucleic acids are DETECTED. The SARS-CoV-2 RNA is generally detectable in upper and lower  respiratory specimens dur ing the acute phase of infection.  Positive  results are indicative of active infection with SARS-CoV-2.  Clinical  correlation with patient history and other diagnostic information is  necessary to determine patient infection status.  Positive results do  not rule out bacterial infection or co-infection with other viruses. If result is PRESUMPTIVE POSTIVE SARS-CoV-2 nucleic acids MAY BE PRESENT.   A presumptive positive result was obtained on the submitted specimen  and confirmed on repeat testing.  While 2019 novel coronavirus  (SARS-CoV-2) nucleic acids may be present in the submitted sample  additional confirmatory testing may be necessary for epidemiological  and / or clinical management purposes  to differentiate between  SARS-CoV-2 and other Sarbecovirus currently known to infect humans.  If clinically indicated additional testing with an alternate test  methodology [phone]) is advised. The SARS-CoV-2 RNA is generally  detectable in upper and lower respiratory sp ecimens during the acute  phase of infection. The expected result is Negative. Fact Sheet for Patients:  StrictlyIdeas.no Fact Sheet for Healthcare Providers: BankingDealers.co.za This test is not yet approved or cleared by the [place] FDA and has been authorized for detection and/or diagnosis of SARS-CoV-2 by FDA under an Emergency Use Authorization (EUA).  This EUA will remain in  effect (meaning this test can be used) for the duration of the COVID-19 declaration under Section 564(b)(1) of the Act, 21 U.S.C. section 360bbb-3(b)(1), unless the authorization is terminated or revoked sooner. Performed at [facility], [place] [address], [place], [place] [address]   Culture, Urine     Status: Abnormal   Collection Time: 05/06/19  5:45 AM   Specimen: Urine, Random  Result Value Ref Range Status   Specimen Description   Final    URINE, RANDOM Performed at [facility] [address] [place], [place] [address]    Special Requests   Final    NONE Performed at [facility], [place] [address], [place], [place] [address]    Culture (A)  Final    <10,000 COLONIES/mL INSIGNIFICANT GROWTH Performed at [facility] [address] [place], [place] [address]    Report Status 05/07/2019 FINAL  Final     Labs: BNP (last 3 results) No results for input(s): BNP in the last 8760 hours. Basic Metabolic Panel: Recent Labs  Lab 05/05/19 1929 05/06/19 0440 05/07/19 0529 05/08/19 0524  NA 139  --  139 138  K 4.3  --  4.6 4.5  CL 105  --  112* 112*  CO2 23  --  20* 21*  GLUCOSE 130*  --  117* 91  BUN 78*  --  71* 64*  CREATININE 4.32* 3.69* 3.31* 2.78*  CALCIUM 10.3  --  9.1 9.2  MG  --   --  1.9  --   PHOS  --   --  3.2  --    Liver Function Tests: Recent Labs  Lab 05/05/19 1929 05/07/19 0529  AST 14*  --   ALT 9  --   ALKPHOS 78  --   BILITOT 0.5  --   PROT 7.8  --   ALBUMIN 4.2 2.7*   No results for input(s): LIPASE, AMYLASE in the last 168 hours. Recent Labs  Lab 05/05/19 2254  AMMONIA 21   CBC: Recent Labs  Lab 05/05/19 1929 05/06/19 0440 05/07/19 0529 05/08/19 0524  WBC 7.8 7.9 8.6 8.6  NEUTROABS 5.0  --  5.6  --   HGB 11.7* 10.7* 8.9* 8.3*  HCT 38.1* 33.4* 28.7* 27.2*  MCV 94.1 92.8 95.7 95.8  PLT 235 199 169 180   Cardiac Enzymes: No results for input(s): CKTOTAL, CKMB,  CKMBINDEX, TROPONINI in the last 168 hours. BNP: Invalid input(s): POCBNP CBG: No results for input(s): GLUCAP in the last 168 hours. D-Dimer No results for input(s): DDIMER in the last 72 hours. Hgb A1c No results for input(s): HGBA1C in the last 72 hours. Lipid Profile No results for input(s): CHOL, HDL, LDLCALC, TRIG, CHOLHDL, LDLDIRECT in the last 72 hours. Thyroid function studies Recent Labs    05/05/19 2007  TSH 1.251   Anemia work up Recent Labs    05/05/19 1929  VITAMINB12 400  FOLATE 7.7  FERRITIN 190  TIBC 248*  IRON 72  RETICCTPCT 1.3   Urinalysis    Component Value Date/Time   COLORURINE YELLOW 05/05/2019 [place] 05/05/2019 2254   LABSPEC 1.011 05/05/2019 2254   PHURINE 5.0 05/05/2019 2254   GLUCOSEU NEGATIVE 05/05/2019 2254   HGBUR NEGATIVE 05/05/2019 2254   BILIRUBINUR NEGATIVE 05/05/2019 2254   BILIRUBINUR negative 09/07/2017 [place] 05/05/2019 2254   PROTEINUR NEGATIVE 05/05/2019 2254   UROBILINOGEN 0.2 09/07/2017 1645   UROBILINOGEN 4.0 (H) 08/04/2016 1517   NITRITE NEGATIVE 05/05/2019 2254   LEUKOCYTESUR MODERATE (A) 05/05/2019 2254   Sepsis Labs Invalid input(s): PROCALCITONIN,  WBC,  LACTICIDVEN Microbiology Recent Results (from the past 240 hour(s))  SARS Coronavirus 2 [facility] order, Performed in [place] hospital lab) Nasopharyngeal Nasopharyngeal Swab     Status: None   Collection Time: 05/05/19  9:39 PM   Specimen: Nasopharyngeal Swab  Result Value Ref Range Status   SARS Coronavirus 2 NEGATIVE NEGATIVE Final    Comment: (NOTE) If result is NEGATIVE SARS-CoV-2 target nucleic acids are NOT DETECTED. The SARS-CoV-2 RNA is generally detectable in upper and lower  respiratory specimens during the acute phase of infection. The lowest  concentration of SARS-CoV-2 viral copies this assay can detect is 250  copies / mL. A negative result does not preclude SARS-CoV-2 infection  and should not be  used as the sole basis for treatment or other  patient management decisions.  A negative result may occur with  improper specimen collection / handling, submission of specimen other  than nasopharyngeal swab, presence of viral mutation(s) within the  areas targeted by this assay, and inadequate number of viral  copies  (<250 copies / mL). A negative result must be combined with clinical  observations, patient history, and epidemiological information. If result is POSITIVE SARS-CoV-2 target nucleic acids are DETECTED. The SARS-CoV-2 RNA is generally detectable in upper and lower  respiratory specimens dur ing the acute phase of infection.  Positive  results are indicative of active infection with SARS-CoV-2.  Clinical  correlation with patient history and other diagnostic information is  necessary to determine patient infection status.  Positive results do  not rule out bacterial infection or co-infection with other viruses. If result is PRESUMPTIVE POSTIVE SARS-CoV-2 nucleic acids MAY BE PRESENT.   A presumptive positive result was obtained on the submitted specimen  and confirmed on repeat testing.  While 2019 novel coronavirus  (SARS-CoV-2) nucleic acids may be present in the submitted sample  additional confirmatory testing may be necessary for epidemiological  and / or clinical management purposes  to differentiate between  SARS-CoV-2 and other Sarbecovirus currently known to infect humans.  If clinically indicated additional testing with an alternate test  methodology [phone]) is advised. The SARS-CoV-2 RNA is generally  detectable in upper and lower respiratory sp ecimens during the acute  phase of infection. The expected result is Negative. Fact Sheet for Patients:  StrictlyIdeas.no Fact Sheet for Healthcare Providers: BankingDealers.co.za This test is not yet approved or cleared by the [place] FDA and has been authorized  for detection and/or diagnosis of SARS-CoV-2 by FDA under an Emergency Use Authorization (EUA).  This EUA will remain in effect (meaning this test can be used) for the duration of the COVID-19 declaration under Section 564(b)(1) of the Act, 21 U.S.C. section 360bbb-3(b)(1), unless the authorization is terminated or revoked sooner. Performed at [facility], [place] [address], [place], [place] [address]   Culture, Urine     Status: Abnormal   Collection Time: 05/06/19  5:45 AM   Specimen: Urine, Random  Result Value Ref Range Status   Specimen Description   Final    URINE, RANDOM Performed at [facility] [address], [place], [place] [address]    Special Requests   Final    NONE Performed at [facility], [place] [address], [place], [place] [address]    Culture (A)  Final    <10,000 COLONIES/mL INSIGNIFICANT GROWTH Performed at [facility] [address] [place], [place] [address]    Report Status 05/07/2019 FINAL  Final     Time coordinating discharge: 40 minutes  SIGNED:   Elmarie Shiley, MD  [facility]

## 2019-05-09 ENCOUNTER — Telehealth: Payer: Self-pay | Admitting: Internal Medicine

## 2019-05-09 NOTE — Telephone Encounter (Signed)
Spoke with wife [name], and have scheduled a Telephone Palliative Consult for 05/12/19 @ 1 PM.

## 2019-05-12 ENCOUNTER — Other Ambulatory Visit: Payer: Self-pay

## 2019-05-12 ENCOUNTER — Other Ambulatory Visit: Payer: Medicare HMO | Admitting: Internal Medicine

## 2019-05-12 DIAGNOSIS — Z515 Encounter for palliative care: Secondary | ICD-10-CM | POA: Diagnosis not present

## 2019-05-12 NOTE — Progress Notes (Signed)
[place] Consult Note Telephone: [phone]  Fax: [phone]  PATIENT NAME: Darrell [name] DOB: Nov 24, 1950 MRN: [id number]  PRIMARY CARE PROVIDER:   [name], MD  REFERRING PROVIDER:  [name], MD [place],  [place] [address]  RESPONSIBLE PARTY:   Strack(wife) [phone] and self     RECOMMENDATIONS and PLAN:   Palliative care encounter Z51.5  1.  Advance Care Planning:  Reviewed Palliative and Hospice care services.  Goals of care reviewed and are primarily to improve nutritional intake and strength. Attempt to restart ambulation with assistance of home PT.  Advanced directives reviewed as well.  DNR code status.  Wife will discuss remainder of MOST selections with pt. And we will re-discuss during next visits.  Support given to wife.  Hospital bed, BS commode and portable oral suction machine for home use was ordered by Hospitalist but unsure of company.  We will attempt to assist wife with locating DME company.  Palliative care will F/U in aprox 2 weeks.    2.  Dysphagia:  Aspiration precautions reviewed. Continue pureed diet.  Hospital bed to raise head and decrease potential for aspiration. Portable suction unit is necessary to remove oral secretions.  Deep breathing and coughing exercises.   Home speech therapy evaluation.  3.  Weakness:  Chronic as related to advancement of motor neuron  disorder.  Supportive care as needed at home and PT.  Re-assess for new baseline in the near future.  Fall prevention. Continue to f/u with neurologist.  Due to the COVID-19 crisis, this visit occurred telephonically and was initiated and consented by patient and or family.  I spent 45 minutes providing this consultation,  from 1300 to 1345. More than 50% of the time in this consultation was spent coordinating communication wife and pt.   HISTORY OF PRESENT ILLNESS:  Darrell [name] is a 68 y.o. year old male with  multiple medical problems including a motor neuron disease, increasing weakness with ability to only transfer to [facility] commode, CKD stage 3.  He was recently admitted to the hospital from 8/10-8/13/2020 due to a choking episode as related to dysphagia.  He was treated for PNA and a UTI.  Palliative Care was asked to help address goals of care.   CODE STATUS:  DNAR/DNI  PPS: 30% HOSPICE ELIGIBILITY/DIAGNOSIS: TBD  PAST MEDICAL HISTORY:  Past Medical History:  Diagnosis Date  . Arthritis   . At high risk for falls 08/16/2015  . Bradycardia   . Chronic kidney disease    stage 3 GFR 30-59 ml/min   . CIDP (chronic inflammatory demyelinating polyneuropathy) ([place])   . CKD (chronic kidney disease) stage 3, GFR 30-59 ml/min ([name]) 08/16/2015  . Dysphagia as late effect of cerebrovascular disease    pts wife states pt has to eat soft foods   . Elevated liver enzymes 08/10/2016  . GERD (gastroesophageal reflux disease)   . Glaucoma   . High cholesterol   . History of CVA with residual deficit 03/25/2013  . Hypertension   . Hypertensive retinopathy of both eyes 01/16/2017  . Inguinal hernia 03/25/2013  . Liver hemangioma 08/14/2016  . Neuromuscular disorder ([employer])    chronic inflammatory demyelinating polyneuropathy   . New onset seizure ([place]) 07/08/2017   seizure 07/14/18  . Nuclear sclerosis of both eyes 10/25/2016  . Presence of permanent cardiac pacemaker    placed in april 2018  . Primary open angle glaucoma of both eyes,  indeterminate stage 10/25/2016  . Renal mass, right 08/14/2016  . Status cardiac pacemaker 01/29/2017   Placed for second degree heart block on 01/16/17 Medtronic Azure XT DR MRI SureScan dual-chamber pacemaker  . Stroke [facility])    2011 with residual deficit left sided weakness  . Tobacco dependence     PHYSICAL EXAM:   General: NAD per wife.  Unable to perform physical exam due to telephonic visit Neurological: Weakness with whispered speech  Gonzella Lex, NP-C

## 2019-05-16 ENCOUNTER — Telehealth: Payer: Self-pay

## 2019-05-16 DIAGNOSIS — N183 Chronic kidney disease, stage 3 (moderate): Secondary | ICD-10-CM | POA: Diagnosis not present

## 2019-05-16 DIAGNOSIS — I129 Hypertensive chronic kidney disease with stage 1 through stage 4 chronic kidney disease, or unspecified chronic kidney disease: Secondary | ICD-10-CM | POA: Diagnosis not present

## 2019-05-16 DIAGNOSIS — N2889 Other specified disorders of kidney and ureter: Secondary | ICD-10-CM | POA: Diagnosis not present

## 2019-05-16 NOTE — Telephone Encounter (Signed)
Phone call placed to [place] to inquire about DME that was ordered per recent discharge summary. Per [employer], no order had been received.

## 2019-05-16 NOTE — Telephone Encounter (Signed)
Message sent via epic to PCP to provide update that patient had not yet received DME and to request an order be sent.

## 2019-05-20 ENCOUNTER — Encounter: Payer: Self-pay | Admitting: Diagnostic Neuroimaging

## 2019-05-20 ENCOUNTER — Other Ambulatory Visit: Payer: Self-pay

## 2019-05-20 ENCOUNTER — Telehealth: Payer: Self-pay | Admitting: Diagnostic Neuroimaging

## 2019-05-20 ENCOUNTER — Ambulatory Visit: Payer: Medicare HMO | Admitting: Diagnostic Neuroimaging

## 2019-05-20 VITALS — BP 132/88 | HR 68 | Temp 97.5°F | Ht 74.0 in | Wt 162.0 lb

## 2019-05-20 DIAGNOSIS — R569 Unspecified convulsions: Secondary | ICD-10-CM

## 2019-05-20 DIAGNOSIS — G1220 Motor neuron disease, unspecified: Secondary | ICD-10-CM

## 2019-05-20 MED ORDER — LEVETIRACETAM 500 MG PO TABS: 500.0000 mg | ORAL_TABLET | Freq: Two times a day (BID) | ORAL | 4 refills | Status: DC

## 2019-05-20 NOTE — Telephone Encounter (Signed)
Patient states he will call back to schedule 1 yr f/u w/ Dr. Leta Baptist

## 2019-05-20 NOTE — Progress Notes (Signed)
[facility]  PATIENT: Darrell [name] DOB: 1951/02/14  REFERRING CLINICIAN: Vanita Panda HISTORY FROM: patient and wife REASON FOR VISIT: follow up   HISTORICAL  CHIEF COMPLAINT:  Chief Complaint  Patient presents with   Seizures    rm 7 FU, wife- [name] "no seizures"    HISTORY OF PRESENT ILLNESS:   UPDATE (05/20/19, VRP): Since last visit, was in the hospital for choking episode, AKI, constipation. Now slightly better. No seizures. Muscle weakness progressing. Dysphagia / dysarthria worsening.    UPDATE (11/11/18, VRP): Since last visit, had seizure on 10/29/18; depakote was increased to 1250mg  twice a day. then could not afford medication, so we changed levetiracetam to 500mg  twice a day. Now tolerating meds, and more affordable.   UPDATE (07/24/18, VRP): Since last visit, has had 2nd opinion at [facility], and now likely has ALS or PLS diagnosis. Symptoms are progressive. Severity is severe. No alleviating or aggravating factors. Has follow up in [place] clinic.  Also had 2 more seizures on 07/14/18; VPA level at ER was undetectable. Given IV loading dose and sent home.   UPDATE (03/19/18, VRP): Since last visit, doing well. No seizures. No alleviating or aggravating factors. More muscle stiffness.   NEW HPI (09/13/17, VRP): 68 year old male with CIDP, here for seizure evaluation. Since last visit, patient declined IVIG therapy that I had ordered in 2015, and then patient lost to followup. Gait and balance continue to decline. 20lb weight loss over last year. Now with new onset seizure in Oct 2018 at home (witnessed by wife). Admitted to hospital and started on levetiracetam, then changed to divalproex 500mg  twice a day, and doing well. No further seizures. No alleviating or aggravating factors.   UPDATE 03/30/14: Since last visit, patient feels strength is stable. Still with slurred speech, swallow diff, balance diff. Now he is interested in IVIG treatment. Completed PT  sessions.  UPDATE 11/25/13: Since last visit patient had EMG and spinal tap. Findings are suspicious for CIDP. Patient's symptoms have gradually progressed. Continues to have more upper and lower extremity weakness, dysarthria, dysphasia and intermittent muscle twitching. Patient is frustrated today and does not want to take anymore medication. Patient's wife would like to find out more options for treatment.  UPDATE 10/03/13: Since last visit, continues to have left sided weakness, worsening speech and swallow difficulty. Is drinking half pint Etoh per 1-2 weeks. Cigarette use has increased to 2 packs per day.  PRIOR HPI (04/03/13): 68 year old right-handed male with hypertension, hypercholesterolemia, smoking, alcohol abuse, here for evaluation of gait and balance difficulty for past one year.  2011 patient had left arm and left leg weakness, diagnosed with stroke. Apparently he was admitted to hospital and treated. He had fairly good recovery initial one year after stroke. He required using a walking stick but otherwise was fairly mobile. Unfortunately he did not have good medical followup at that time. Patient was abusing alcohol significantly at that time, ranging from 1 pint up to a fifth of alcohol per day. In the past one to 2 years he has cut down on his drinking, now averaging 1 pint of alcohol per week.  In the past one year, patient's mobility is significantly deteriorated. He also has increasing slurred speech, trouble swallowing, balance and gait difficulty. He continues to fall down. Symptoms are worse when he drinks alcohol. Nowadays he is unable to stand up and walk across a room without assistance. This is been the case for the past one year.  REVIEW OF SYSTEMS: Full 14 system review of systems performed and negative except: as per HPI.    ALLERGIES: Allergies  Allergen Reactions   Ace Inhibitors Other (See Comments)    Hyperkalemia   Doxycycline Other (See Comments)    Hiccups,  cough, nausea and emesis, elevated liver enzymes, elevated eosinophils, SOB concerning for early DRESS syndrome    Atacand Hct [Candesartan Cilexetil-Hctz] Hives   Shellfish Allergy Hives    HOME MEDICATIONS: Outpatient Medications Prior to Visit  Medication Sig Dispense Refill   aspirin EC 81 MG tablet Take 1 tablet (81 mg total) by mouth daily. 90 tablet 3   atorvastatin (LIPITOR) 40 MG tablet Take 1 tablet (40 mg total) by mouth daily. 90 tablet 1   brimonidine (ALPHAGAN P) 0.1 % SOLN Place 1 drop into both eyes daily.      dorzolamide-timolol (COSOPT) 22.3-6.8 MG/ML ophthalmic solution Place 1 drop into both eyes daily.      ferrous sulfate 325 (65 FE) MG tablet Take 1 tablet (325 mg total) by mouth 2 (two) times daily with a meal. 60 tablet 3   lactulose (CHRONULAC) 10 GM/15ML solution Take 15 mLs (10 g total) by mouth daily. 946 mL 1   latanoprost (XALATAN) 0.005 % ophthalmic solution Place 1 drop into both eyes at bedtime.     levETIRAcetam (KEPPRA) 500 MG tablet Take 1 tablet (500 mg total) by mouth 2 (two) times daily. 180 tablet 4   Multiple Vitamins-Iron (DAILY VITAMIN FORMULA+IRON) TABS Take 1 tablet by mouth daily.     polyethylene glycol (MIRALAX / GLYCOLAX) packet Take 17 g by mouth daily as needed for mild constipation. 30 each 3   Sennosides-Docusate Sodium (SENNA S PO) Take 1 tablet by mouth daily as needed (constipation).      tamsulosin (FLOMAX) 0.4 MG CAPS capsule Take 1 capsule (0.4 mg total) by mouth daily. 30 capsule 3   Vitamin D, Ergocalciferol, (DRISDOL) 1.25 MG (50000 UT) CAPS capsule Take 1 capsule by mouth once a week 4 capsule 0   fluticasone (FLONASE) 50 MCG/ACT nasal spray Place 2 sprays into both nostrils daily. (Patient not taking: Reported on 05/20/2019) 16 g 6   Misc. Devices MISC Donut cushion. Dx- Decubitus ulcer of Sacrum (Patient not taking: Reported on 05/20/2019) 1 each 0   terbinafine (LAMISIL AT) 1 % cream Apply 1 application  topically 2 (two) times daily. To R foot (Patient not taking: Reported on 05/05/2019) 30 g 0   azithromycin (ZITHROMAX) 250 MG tablet Take one tablet daily for 3 days. 3 each 0   No facility-administered medications prior to visit.     PAST MEDICAL HISTORY: Past Medical History:  Diagnosis Date   Arthritis    At high risk for falls 08/16/2015   Bradycardia    Chronic kidney disease    stage 3 GFR 30-59 ml/min    CIDP (chronic inflammatory demyelinating polyneuropathy) ([name])    CKD (chronic kidney disease) stage 3, GFR 30-59 ml/min ([place]) 08/16/2015   Dysphagia as late effect of cerebrovascular disease    pts wife states pt has to eat soft foods    Elevated liver enzymes 08/10/2016   GERD (gastroesophageal reflux disease)    Glaucoma    High cholesterol    History of CVA with residual deficit 03/25/2013   Hypertension    Hypertensive retinopathy of both eyes 01/16/2017   Inguinal hernia 03/25/2013   Liver hemangioma 08/14/2016   Neuromuscular disorder ([place])    chronic inflammatory demyelinating polyneuropathy  New onset seizure ([place]) 07/08/2017   seizure 07/14/18   Nuclear sclerosis of both eyes 10/25/2016   Presence of permanent cardiac pacemaker    placed in april 2018   Primary open angle glaucoma of both eyes, indeterminate stage 10/25/2016   Renal mass, right 08/14/2016   Status cardiac pacemaker 01/29/2017   Placed for second degree heart block on 01/16/17 Medtronic Azure XT DR MRI SureScan dual-chamber pacemaker   Stroke [facility])    2011 with residual deficit left sided weakness   Tobacco dependence     PAST SURGICAL HISTORY: Past Surgical History:  Procedure Laterality Date   EYE SURGERY     HERNIA REPAIR     INGUINAL HERNIA REPAIR Right 11/23/2014   Procedure: right inguinal hernia repair with mesh;  Surgeon: Armandina Gemma, MD;  Location: [facility];  Service: General;  Laterality: Right;   INSERTION OF MESH N/A 11/23/2014   Procedure: INSERTION OF  MESH;  Surgeon: Armandina Gemma, MD;  Location: [facility];  Service: General;  Laterality: N/A;   MASS EXCISION Left 08/29/2017   Procedure: EXCISION OF LEFT NECK MASS;  Surgeon: Coralie Keens, MD;  Location: [facility];  Service: General;  Laterality: Left;   PACEMAKER IMPLANT N/A 01/16/2017   Procedure: Pacemaker Implant;  Surgeon: Will Meredith Leeds, MD;  Location: [facility] [facility];  Service: Cardiovascular;  Laterality: N/A;   SHOULDER SURGERY Bilateral 1988, 1998    FAMILY HISTORY: Family History  Problem Relation Age of Onset   Hypertension Mother    Diabetes Sister    Hypertension Sister    Cancer Sister        1 sister   COPD Sister        in 1 sister   Stomach cancer Neg Hx    Colon cancer Neg Hx    Pancreatic cancer Neg Hx    Esophageal cancer Neg Hx     SOCIAL HISTORY:  Social History   Socioeconomic History   Marital status: Married    Spouse name: [name]   Number of children: 1   Years of education: college   Highest education level: Not on file  Occupational History   Occupation: retired  Scientist, product/process development strain: Not on file   Food insecurity    Worry: Not on file    Inability: Not on Lexicographer needs    Medical: Not on file    Non-medical: Not on file  Tobacco Use   Smoking status: Current Every Day Smoker    Packs/day: 1.00    Years: 40.00    Pack years: 40.00    Types: Cigarettes   Smokeless tobacco: Never Used   Tobacco comment: 09/13/17 little < 1 PPD  Substance and Sexual Activity   Alcohol use: No    Comment: alcohol free for 1 year was drinking 1/2 pint per day    Drug use: No   Sexual activity: Not on file  Lifestyle   Physical activity    Days per week: Not on file    Minutes per session: Not on file   Stress: Not on file  Relationships   Social connections    Talks on phone: Not on file    Gets together: Not on file    Attends religious service: Not on file    Active member  of club or organization: Not on file    Attends meetings of clubs or organizations: Not on file    Relationship status: Not  on file   Intimate partner violence    Fear of current or ex partner: Not on file    Emotionally abused: Not on file    Physically abused: Not on file    Forced sexual activity: Not on file  Other Topics Concern   Not on file  Social History Narrative   09/13/17 Patient lives at home with spouse.   Has 77 yo daughter   No grandchildren     PHYSICAL EXAM  Vitals:   05/20/19 1051  BP: 132/88  Pulse: 68  Temp: (!) 97.5 F (36.4 C)  Weight: 162 lb (73.5 kg)  Height: 6\' 2"  (1.88 m)    Not recorded      Body mass index is 20.8 kg/m.  GENERAL EXAM: Patient is in no distress  CARDIOVASCULAR: Regular rate and rhythm, no murmurs, no carotid bruits  NEUROLOGIC: MENTAL STATUS: awake, alert, language fluent, comprehension intact, naming intact CRANIAL NERVE: pupils equal and reactive to light, visual fields full to confrontation, extraocular muscles intact, no nystagmus, facial sensation and strength symmetric, uvula midline, shoulder shrug symmetric, tongue midline. SEVERE DYSARTHIA. INTERMITTENT TONGUE FASCICULATIONS.  MOTOR: INCREASED TONE IN LUE > RUE; LLE > RLE. RUE 5, RLE 5. LUE 2-3 PROX, 3-4 DISTAL. RLE 5; LLE 4  SENSORY: DECR IN LUE AND LLE TO TEMP AND VIBRATION COORDINATION: LIMITED IN LEFT SIDE. REFLEXES: BUE and BLE 3, CLONUS AT ANKLES (L > R) GAIT/STATION: LEANS BACK IN WHEELCHAIR; CANNOT STAND INDEPENDENTLY    DIAGNOSTIC DATA (LABS, IMAGING, TESTING) - I reviewed patient records, labs, notes, testing and imaging myself where available.  Lab Results  Component Value Date   WBC 8.6 05/08/2019   HGB 8.3 (L) 05/08/2019   HCT 27.2 (L) 05/08/2019   MCV 95.8 05/08/2019   PLT 180 05/08/2019      Component Value Date/Time   NA 138 05/08/2019 0524   NA 143 06/13/2018 0954   K 4.5 05/08/2019 0524   CL 112 (H) 05/08/2019 0524   CO2 21  (L) 05/08/2019 0524   GLUCOSE 91 05/08/2019 0524   BUN 64 (H) 05/08/2019 0524   BUN 19 06/13/2018 0954   CREATININE 2.78 (H) 05/08/2019 0524   CREATININE 1.45 (H) 08/10/2016 1647   CALCIUM 9.2 05/08/2019 0524   PROT 7.8 05/05/2019 1929   PROT 6.6 06/13/2018 0954   ALBUMIN 2.7 (L) 05/07/2019 0529   ALBUMIN 4.1 06/13/2018 0954   AST 14 (L) 05/05/2019 1929   ALT 9 05/05/2019 1929   ALKPHOS 78 05/05/2019 1929   BILITOT 0.5 05/05/2019 1929   BILITOT 0.4 06/13/2018 0954   GFRNONAA 23 (L) 05/08/2019 0524   GFRNONAA 51 (L) 08/10/2016 1647   GFRAA 26 (L) 05/08/2019 0524   GFRAA 58 (L) 08/10/2016 1647   Lab Results  Component Value Date   CHOL 112 06/13/2018   HDL 40 06/13/2018   LDLCALC 62 06/13/2018   TRIG 51 06/13/2018   CHOLHDL 2.8 06/13/2018   Lab Results  Component Value Date   HGBA1C 6.2 09/12/2018   Lab Results  Component Value Date   VITAMINB12 400 05/05/2019   Lab Results  Component Value Date   TSH 1.251 05/05/2019    02/27/13 CT head - severe chronic small vessel ischemic disease, periventricular, subcortical, pontine and cerebellar.   04/22/13 MRI BRAIN 1. Scattered chronic lacunar ischemic infarctions in the bilateral thalami, right basal ganglia, right centrum semioval and left anterior parasagittal regions.  2. Moderate chronic small vessel ischemic disease. Few small, chronic  cerebral microbleeds in the right parietal and and left frontal regions, also within spectrum of chronic small vessel ischemic disease. 3. No acute findings.  04/22/13 MRI CERVICAL SPINE 1. At C5-6: disc bulging and facet hypertrophy with mild spinal stenosis (AP diameter 20mm) and severe biforaminal foraminal stenosis; no cord signal changes. 2. Multilevel degenerative changes and foraminal stenosis from C2-3 to C6-7 as above.   10/20/13 EMG/NCS 1. 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 Abnormal F-wave latencies (including  absent F-waves and  slowing in the demyelinating range in some  nerves) raises possibility of concomitant polyradiculopathy.  Considerations include chronic immune/inflammatory neuropathies,  such as CIDP. 2. No evidence of myopathy at this time.  10/31/13 CSF results: WBC 5-->0, RBC 25-->2, protein 63, glucose 62, ACE 5, cytology negati ve, culture negative  07/08/17 EEG - There is a posterior dominant rhythm of 7-8 Hz reactive to eye opening and closure.  No focal slowing is present.  No epileptiform discharges or seizures. - There is evidence of mild generalized slowing of brain activity consistent with probable post-ictal state +/- toxic, metabolic, or infectious encephalopathy.  The patient is not in non-convulsive status epilepticus.   07/09/17 MRI brain  - Truncated and motion degraded examination. Within that limitation, no acute ischemia or significant mass effect. Advanced chronic microvascular disease.    ASSESSMENT AND PLAN  68 y.o. year old male  has a past medical history of Arthritis, At high risk for falls (08/16/2015), Bradycardia, Chronic kidney disease, CIDP (chronic inflammatory demyelinating polyneuropathy) ([place]), CKD (chronic kidney disease) stage 3, GFR 30-59 ml/min ([name]) (08/16/2015), Dysphagia as late effect of cerebrovascular disease, Elevated liver enzymes (08/10/2016), GERD (gastroesophageal reflux disease), Glaucoma, High cholesterol, History of CVA with residual deficit (03/25/2013), Hypertension, Hypertensive retinopathy of both eyes (01/16/2017), Inguinal hernia (03/25/2013), Liver hemangioma (08/14/2016), Neuromuscular disorder ([place]), New onset seizure ([place]) (07/08/2017), Nuclear sclerosis of both eyes (10/25/2016), Presence of permanent cardiac pacemaker, Primary open angle glaucoma of both eyes, indeterminate stage (10/25/2016), Renal mass, right (08/14/2016), Status cardiac pacemaker (01/29/2017), Stroke ([place]), and Tobacco dependence. here with progressive gait and balance or deterioration  since 2013. Also with dysarthria, dysphagia, left sided weakness and hyperreflexia.   Now with new onset seizure in Oct 2018. Breakthrough sz in Oct 2019.    Dx1 gait and balance diff: motor neuron disease (ALS vs PLS) + etoh neuropathy  Dx2: seizure disorder  1. Seizures ([place])   2. 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[place])     PLAN:  SEIZURE DISORDER (? related to CNS vascular disease) - continue levetiracetam 500mg  twice a day   MUSCLE WEAKNESS / NEUROPATHY / HYPERTONIA / DYSPHAGIA (ALS vs PLS; + sensory neuropathy from ETOH?) - follow up with ALS clinic (Dr. Tillman Abide)  CEREBROVASCULAR DISEASE - Risk factor mgmt per PCP for cerebrovascular disease - continue smoking and ETOH cessation  CERVICAL SPINAL STENOSIS - Conservative mgmt of cervical spinal stenosis (mild at C5-6)  Meds ordered this encounter  Medications   levETIRAcetam (KEPPRA) 500 MG tablet    Sig: Take 1 tablet (500 mg total) by mouth 2 (two) times daily.    Dispense:  180 tablet    Refill:  4   Return in about 1 year (around 05/19/2020).    Penni Bombard, MD 8/34/1962, 22:97 AM Certified in Neurology, Neurophysiology and Neuroimaging  [facility] [address] [place], [place] [address] [phone]

## 2019-05-21 ENCOUNTER — Telehealth: Payer: Self-pay

## 2019-05-21 NOTE — Telephone Encounter (Signed)
Call placed to Gonzella Lex, NP/[facility] regarding DME needed for patient.  She had a telephone visit with patient/ caregiver on 05/12/2019.  Informed her that as per Dr Margarita Rana, she Enid Derry)  can order the semi electric hospital bed, shower bench and portable suction and can use [employer].    Enid Derry is requesting home health services for patient as they were not ordered at discharge. -  Informed her that Dr Margarita Rana has an appointment scheduled with patient tomorrow - 05/22/2019 and can place referral at that time.

## 2019-05-22 ENCOUNTER — Ambulatory Visit (INDEPENDENT_AMBULATORY_CARE_PROVIDER_SITE_OTHER): Payer: Medicare HMO | Admitting: Family Medicine

## 2019-05-22 ENCOUNTER — Other Ambulatory Visit: Payer: Self-pay | Admitting: Family Medicine

## 2019-05-22 ENCOUNTER — Encounter: Payer: Self-pay | Admitting: Family Medicine

## 2019-05-22 DIAGNOSIS — N184 Chronic kidney disease, stage 4 (severe): Secondary | ICD-10-CM | POA: Diagnosis not present

## 2019-05-22 DIAGNOSIS — G40909 Epilepsy, unspecified, not intractable, without status epilepticus: Secondary | ICD-10-CM | POA: Diagnosis not present

## 2019-05-22 DIAGNOSIS — G1220 Motor neuron disease, unspecified: Secondary | ICD-10-CM | POA: Diagnosis not present

## 2019-05-22 DIAGNOSIS — D638 Anemia in other chronic diseases classified elsewhere: Secondary | ICD-10-CM

## 2019-05-22 NOTE — Progress Notes (Signed)
Virtual Visit via Telephone Note  I connected with Darrell [name], on 05/22/2019 at 11:15 AM by telephone due to the COVID-19 pandemic and verified that I am speaking with the correct person using two identifiers.   Consent: I discussed the limitations, risks, security and privacy concerns of performing an evaluation and management service by telephone and the availability of in person appointments. I also discussed with the patient that there may be a patient responsible charge related to this service. The patient expressed understanding and agreed to proceed.   Location of Patient: Home  Location of Provider: Clinic   Persons participating in Telemedicine visit: Tedford Berg [facility] Dr. Felecia Shelling Spouse   History of Present Illness: Darrell [name] is a 68 year old male with a history of hypertension, previous CVA with residual left-sided weakness, seizure in the setting of prior CVA, second degree AV block (status post medtronic dual chamber pacemaker) stage III CKD, motor neuron disease versus ALS who is seen today for follow-up visit. Hospitalized at [facility] from 05/05/19- 05/08/19 for acute on chronic kidney injury secondary to to poor intake.  His diuretic was discontinued and so was lisinopril with improvement in kidney function.  CT renal stone protocol revealed indeterminate bilateral renal lesions for which he was referred to a surgeon.  Palliative care was also set up prior to discharge.    He is in need of a hospital bed, suction device and shower bench. I had spoken with our case manager who is working on this for him. He has a motor neuron disease with associated production of secretions and is unable to cough this up. This conditions requires positioning in bed not feasible with an ordinary bed.  Diet consists of pured foods and chopped up foods. He has no recent falls. Upcoming appointments with the surgeon for review of his kidney lesions comes up  next month and with his Neurologist ([place] [facility]) at [facility]. He had a visit with [facility] Neurology 2 days ago. History obtained from spouse.  Past Medical History:  Diagnosis Date  . Arthritis   . At high risk for falls 08/16/2015  . Bradycardia   . Chronic kidney disease    stage 3 GFR 30-59 ml/min   . CIDP (chronic inflammatory demyelinating polyneuropathy) ([place])   . CKD (chronic kidney disease) stage 3, GFR 30-59 ml/min ([name]) 08/16/2015  . Dysphagia as late effect of cerebrovascular disease    pts wife states pt has to eat soft foods   . Elevated liver enzymes 08/10/2016  . GERD (gastroesophageal reflux disease)   . Glaucoma   . High cholesterol   . History of CVA with residual deficit 03/25/2013  . Hypertension   . Hypertensive retinopathy of both eyes 01/16/2017  . Inguinal hernia 03/25/2013  . Liver hemangioma 08/14/2016  . Neuromuscular disorder ([place])    chronic inflammatory demyelinating polyneuropathy   . New onset seizure ([place]) 07/08/2017   seizure 07/14/18  . Nuclear sclerosis of both eyes 10/25/2016  . Presence of permanent cardiac pacemaker    placed in april 2018  . Primary open angle glaucoma of both eyes, indeterminate stage 10/25/2016  . Renal mass, right 08/14/2016  . Status cardiac pacemaker 01/29/2017   Placed for second degree heart block on 01/16/17 Medtronic Azure XT DR MRI SureScan dual-chamber pacemaker  . Stroke [facility])    2011 with residual deficit left sided weakness  . Tobacco dependence    Allergies  Allergen Reactions  . Ace Inhibitors Other (See  Comments)    Hyperkalemia  . Doxycycline Other (See Comments)    Hiccups, cough, nausea and emesis, elevated liver enzymes, elevated eosinophils, SOB concerning for early DRESS syndrome   . Atacand Hct [Candesartan Cilexetil-Hctz] Hives  . Shellfish Allergy Hives    Current Outpatient Medications on File Prior to Visit  Medication Sig Dispense Refill  . aspirin EC 81 MG tablet Take 1 tablet (81 mg total)  by mouth daily. 90 tablet 3  . atorvastatin (LIPITOR) 40 MG tablet Take 1 tablet (40 mg total) by mouth daily. 90 tablet 1  . brimonidine (ALPHAGAN P) 0.1 % SOLN Place 1 drop into both eyes daily.     . dorzolamide-timolol (COSOPT) 22.3-6.8 MG/ML ophthalmic solution Place 1 drop into both eyes daily.     . ferrous sulfate 325 (65 FE) MG tablet Take 1 tablet (325 mg total) by mouth 2 (two) times daily with a meal. 60 tablet 3  . lactulose (CHRONULAC) 10 GM/15ML solution Take 15 mLs (10 g total) by mouth daily. 946 mL 1  . latanoprost (XALATAN) 0.005 % ophthalmic solution Place 1 drop into both eyes at bedtime.    . levETIRAcetam (KEPPRA) 500 MG tablet Take 1 tablet (500 mg total) by mouth 2 (two) times daily. 180 tablet 4  . metoprolol tartrate (LOPRESSOR) 50 MG tablet Take 50 mg by mouth 2 (two) times daily.    . Multiple Vitamins-Iron (DAILY VITAMIN FORMULA+IRON) TABS Take 1 tablet by mouth daily.    . polyethylene glycol (MIRALAX / GLYCOLAX) packet Take 17 g by mouth daily as needed for mild constipation. 30 each 3  . tamsulosin (FLOMAX) 0.4 MG CAPS capsule Take 1 capsule (0.4 mg total) by mouth daily. 30 capsule 3   No current facility-administered medications on file prior to visit.     Observations/Objective: Awake, alert, oriented x3 Not in acute distress  Assessment and Plan: 1. 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[place]) Followed by [facility], [facility] and also palliative care Currently working with the case worker to ensure he obtains his equipments - Ambulatory referral to [place]  2. Seizure disorder ([place]) Stable on Keppra  3. Chronic kidney disease (CKD), stage IV (severe) ([name]) Last creatinine was 2.78 Followed by Nephrology   4. Anemia of chronic disease Last Hgb was 8.3 Currently on ferrous sulfate Will benefit from iron replacement therapy Advised spouse to speak with Nephrologist about this.   Follow Up Instructions: 3 months   I  discussed the assessment and treatment plan with the patient. The patient was provided an opportunity to ask questions and all were answered. The patient agreed with the plan and demonstrated an understanding of the instructions.   The patient was advised to call back or seek an in-person evaluation if the symptoms worsen or if the condition fails to improve as anticipated.     I provided 22 minutes total of non-face-to-face time during this encounter including median intraservice time, reviewing previous notes, labs, imaging, medications, management and patient verbalized understanding.     [name], MD, FAAFP. [facility] [place], [place]   05/22/2019, 11:15 AM

## 2019-05-26 ENCOUNTER — Other Ambulatory Visit: Payer: Medicare HMO | Admitting: Internal Medicine

## 2019-05-26 ENCOUNTER — Other Ambulatory Visit: Payer: Self-pay

## 2019-05-26 ENCOUNTER — Telehealth: Payer: Self-pay

## 2019-05-26 DIAGNOSIS — N183 Chronic kidney disease, stage 3 (moderate): Secondary | ICD-10-CM | POA: Diagnosis not present

## 2019-05-26 DIAGNOSIS — R339 Retention of urine, unspecified: Secondary | ICD-10-CM | POA: Diagnosis not present

## 2019-05-26 DIAGNOSIS — D4101 Neoplasm of uncertain behavior of right kidney: Secondary | ICD-10-CM | POA: Diagnosis not present

## 2019-05-26 DIAGNOSIS — D4102 Neoplasm of uncertain behavior of left kidney: Secondary | ICD-10-CM | POA: Diagnosis not present

## 2019-05-26 NOTE — Telephone Encounter (Signed)
Referral received for home health RN and PT. Attempted to contact patient's wife to inquire if they have a preference for home health agencies.  Call placed to # [phone], message left requesting a call back to this CM  Call placed to # [phone], patient answered and said he wife would be back in 30 minutes. Informed him that this CM would call back tomorrow. The call was then dropped.

## 2019-05-27 ENCOUNTER — Telehealth: Payer: Self-pay

## 2019-05-27 NOTE — Telephone Encounter (Signed)
Phone call placed to [facility] with [facility] to follow up with request for DME. Katharine Look requested demographic sheet sent to continue with order. Requested info sent

## 2019-05-27 NOTE — Telephone Encounter (Signed)
Call placed to patient's wife, [name], to inquire if they have a preference for home health agencies.  She had no preference. Informed her that the referral will be sent to [facility] to start and if they are not able to accept the referral, other agencies will be contacted.  She was in agreement and the referral was faxed to [facility].  [name] stated that the DME has not been delivered yet.  Call placed to Gonzella Lex, NP and informed that patient has not yet received the DME.  She stated that she will have their nurse navigator check the status of the order.

## 2019-05-27 NOTE — Telephone Encounter (Signed)
Message received from Rance Muir, [place] stating that they are not able to accept the referral due to lack of staffing.   Call placed to [facility], # [phone], spoke to [facility] who stated that they are not able to accept the referral due to staffing.   Call placed to [facility] # [phone], [facility] to [facility] who requested that the referral be faxed to them for review.   Referral then faxed to # [phone]

## 2019-05-28 ENCOUNTER — Ambulatory Visit: Payer: Medicare HMO | Admitting: Family Medicine

## 2019-05-28 DIAGNOSIS — G1223 Primary lateral sclerosis: Secondary | ICD-10-CM | POA: Diagnosis not present

## 2019-05-28 NOTE — Telephone Encounter (Signed)
Call placed to [facility], spoke to [place] who stated that they are not able to accept the referral due to staffing.   Call placed to [facility] # [phone], spoke to [facility] who requested that the referral be faxed for review and insurance check. They accept some [employer] Medicare plans but will not know if they are in network until they run the insurance.  Referral faxed as requested to # [phone].

## 2019-05-29 ENCOUNTER — Ambulatory Visit: Payer: Medicare HMO | Admitting: Family Medicine

## 2019-05-29 ENCOUNTER — Telehealth: Payer: Self-pay

## 2019-05-29 NOTE — Telephone Encounter (Signed)
Message received from [name] # [phone] stating that they are not able to accept the referral because they are not in network with his insurance.  Based on her information, [name], [facility] and [name] may be in network.   Darrell [name] has already stated that they are not able to accept a referral.  Call placed to [facility] # [phone], spoke to [name] who requested that the referral be faxed for review by manager.  Fax # [phone] - attnMaudie Mercury  - referral then faxed as requested.

## 2019-06-03 ENCOUNTER — Telehealth: Payer: Self-pay

## 2019-06-03 NOTE — Telephone Encounter (Addendum)
Fax received from [facility] noting that they are not able to accept the referral.   Call placed to [facility] # [phone], spoke to [place] who stated that they are not able to accept the referral due to staffing.   Call placed to [facility] # [phone], spoke to [place] who stated that this CM needs to contact their central intake office # [phone].  Call placed to central intake, message left for Anna/operator requesting a call back to this CM # [phone].  Call received from Anna/[facility] central intake who noted that they are not in network with patient's insurance.   Call placed to [facility] # [phone], spoke to Angie/home health coordinator # [phone] who stated that she would check with [employer] to determine if they are in network.   Call received from Angie/[facility] who stated that they are not in network with patient's insurance.  He has a $10,000 out of pocket expense and would have to pay 50% of the cost of the nursing visits which is $165.   Call placed to [facility] # [phone], requested referral be faxed for review # [phone]. Referral faxed as requested.

## 2019-06-04 ENCOUNTER — Telehealth: Payer: Self-pay

## 2019-06-04 NOTE — Telephone Encounter (Addendum)
Call placed to [facility], Spoke to [place] who confirmed that they accepted the referral, it has been sent to clinical to schedule.   Call placed to patient's wife and explained that the home health referral was accepted by [facility] and they will be contacting her to schedule home visit. She stated that her husband has still received his DME.  Message left for [facility] Cates,NP/[facility] regarding DME. Call back requested to this CM # [phone].

## 2019-06-05 ENCOUNTER — Telehealth: Payer: Self-pay | Admitting: Internal Medicine

## 2019-06-05 ENCOUNTER — Telehealth: Payer: Self-pay

## 2019-06-05 NOTE — Telephone Encounter (Signed)
Call received from [facility], NP/[facility]. She explained that [facility]  is waiting for signed CMN before they can deliver the DME.

## 2019-06-05 NOTE — Telephone Encounter (Signed)
2:10pm  Phoned [facility] to inquire about pending orders/delivery of DME equipment. [phone]  Representative Ivin Booty states that she is awaiting a signed document of certifying medical necessity that has been faxed to Dr. Smitty Pluck office at [phone].  When she receives that document, they will arrange delivery to the patient.  She states that she has the contact information for Darrell [name].  Phoned Eden Lathe, RN at Dr. Smitty Pluck office and left a message regarding above information.  We will follow-up with PCP and wife to make sure delivery is complete.  Gonzella Lex, NP-C

## 2019-06-06 NOTE — Telephone Encounter (Signed)
Paperwork has been received and placed on PCP desk for signature. 

## 2019-06-06 NOTE — Telephone Encounter (Signed)
Completed.

## 2019-06-11 ENCOUNTER — Other Ambulatory Visit: Payer: Self-pay | Admitting: Family Medicine

## 2019-06-15 ENCOUNTER — Other Ambulatory Visit: Payer: Self-pay | Admitting: Family Medicine

## 2019-06-23 ENCOUNTER — Telehealth: Payer: Self-pay

## 2019-06-23 ENCOUNTER — Other Ambulatory Visit: Payer: Medicare HMO | Admitting: Internal Medicine

## 2019-06-23 DIAGNOSIS — G1220 Motor neuron disease, unspecified: Secondary | ICD-10-CM

## 2019-06-23 NOTE — Telephone Encounter (Signed)
Call received from [facility]. She explained that today is the first day that she has been to patient's home for in person visit. She said that patient's wife as hospitalized 06/09/2019 after being found unconscious.  She has been treated for dehydration and HTN and is now preparing for discharge to rehab.  Patient's mother in law and sister in law have been providing care for him. The patient gave Enid Derry permission to speak to both of them but not to his daughter.   Enid Derry reported that the DME still has not been delivered. She is not sure if [facility] has been trying to reach patient's wife and have not been able to due to her hospitalization.  She has instructed patient's sister in law to contact [facility].  This includes the hospital bed, shower bench and suction.  The family is also concerned about the status of home therapy. This CM to [facility] tomorrow to check on plans for home PT home visits.  This CM to also check with [facility] about home health aid services and inquire if they can confirm insurance coverage for help at home.  This CM explained to [place] that the coverage for aide services would be very limited and only provided as long as patient needs home PT or SN.   Enid Derry noted that his family mentioned that he is not eligible for medicaid. The family is providing 24/7 assistance for the patient to insure safety.

## 2019-06-24 ENCOUNTER — Other Ambulatory Visit: Payer: Self-pay

## 2019-06-24 ENCOUNTER — Ambulatory Visit (INDEPENDENT_AMBULATORY_CARE_PROVIDER_SITE_OTHER): Payer: Medicare HMO | Admitting: *Deleted

## 2019-06-24 DIAGNOSIS — R001 Bradycardia, unspecified: Secondary | ICD-10-CM | POA: Diagnosis not present

## 2019-06-24 LAB — CUP PACEART REMOTE DEVICE CHECK
Battery Remaining Longevity: 115 mo
Battery Voltage: 3.01 V
Brady Statistic AP VP Percent: 15.45 %
Brady Statistic AP VS Percent: 26.75 %
Brady Statistic AS VP Percent: 0.16 %
Brady Statistic AS VS Percent: 57.64 %
Brady Statistic RA Percent Paced: 48.75 %
Brady Statistic RV Percent Paced: 15.61 %
Date Time Interrogation Session: 20200928060401
Implantable Lead Implant Date: 20180424
Implantable Lead Implant Date: 20180424
Implantable Lead Location: 753859
Implantable Lead Location: 753860
Implantable Lead Model: 5076
Implantable Lead Model: 5076
Implantable Pulse Generator Implant Date: 20180424
Lead Channel Impedance Value: 285 Ohm
Lead Channel Impedance Value: 323 Ohm
Lead Channel Impedance Value: 380 Ohm
Lead Channel Impedance Value: 380 Ohm
Lead Channel Pacing Threshold Amplitude: 0.625 V
Lead Channel Pacing Threshold Amplitude: 0.625 V
Lead Channel Pacing Threshold Pulse Width: 0.4 ms
Lead Channel Pacing Threshold Pulse Width: 0.4 ms
Lead Channel Sensing Intrinsic Amplitude: 1.125 mV
Lead Channel Sensing Intrinsic Amplitude: 1.125 mV
Lead Channel Sensing Intrinsic Amplitude: 9.625 mV
Lead Channel Sensing Intrinsic Amplitude: 9.625 mV
Lead Channel Setting Pacing Amplitude: 1.5 V
Lead Channel Setting Pacing Amplitude: 2.5 V
Lead Channel Setting Pacing Pulse Width: 0.4 ms
Lead Channel Setting Sensing Sensitivity: 2 mV

## 2019-06-24 NOTE — Progress Notes (Unsigned)
[facility]rell [name] Consult Note Telephone: [phone]  Fax: [phone]  PATIENT NAME: Darrell [name] DOB: 01-24-1951 MRN: [id number]  PRIMARY CARE PROVIDER:   [name], MD  REFERRING PROVIDER:  [name], MD Darrell [name],  Darrell [name] [address]  RESPONSIBLE PARTY:   Darrell [name]) [phone] and self     RECOMMENDATIONS and PLAN:   Palliative care encounter Z51.5  1.  Advance Care Planning:  Reviewed Palliative and Hospice care services.  Goals of care reviewed and are primarily to improve nutritional intake and strength. Attempt to restart ambulation with assistance of home PT.  Advanced directives reviewed as well.  DNR code status.  Wife will discuss remainder of MOST selections with pt. And we will re-discuss during next visits.  Support given to wife.  Hospital bed, BS commode and portable oral suction machine for home use will be delivered on 9/30. Delays related to wife's current illness. Palliative care will F/U in 1week.    2.  Dysphagia:  Aspiration precautions reviewed. Continue pureed diet.  Hospital bed to raise head and decrease potential for aspiration. Portable suction unit is necessary to remove oral secretions.    Home speech therapy evaluation.  3.  Weakness:  Chronic as related to advancement of motor neuron  disorder.  Supportive care currently provided by immediate family.Home PT would still be beneficial.  Continue to f/u with neurologist.  I spent 120 minutes providing this consultation,  from 1500 to 1700. More than 50% of the time in this consultation was spent coordinating communication with pt, mother in law and sister in laws with pt's permission.   HISTORY OF PRESENT ILLNESS: Follow-up with  Darrell [name].  Met by mother-in-law Ms. Darrell [name] who states that pt's wife Darrell [name] is currently hospitalized after being found unconscious in the home. She, her daughters and pt's daughter have been  providing 24/7 care form Darrell [name].    Palliative Care was asked to help address goals of care.   CODE STATUS:  FULL CODE  PPS: 30% HOSPICE ELIGIBILITY/DIAGNOSIS: TBD  PAST MEDICAL HISTORY:  Past Medical History:  Diagnosis Date   Arthritis    At high risk for falls 08/16/2015   Bradycardia    Chronic kidney disease    stage 3 GFR 30-59 ml/min    CIDP (chronic inflammatory demyelinating polyneuropathy) ([name])    CKD (chronic kidney disease) stage 3, GFR 30-59 ml/min ([place]) 08/16/2015   Dysphagia as late effect of cerebrovascular disease    pts wife states pt has to eat soft foods    Elevated liver enzymes 08/10/2016   GERD (gastroesophageal reflux disease)    Glaucoma    High cholesterol    History of CVA with residual deficit 03/25/2013   Hypertension    Hypertensive retinopathy of both eyes 01/16/2017   Inguinal hernia 03/25/2013   Liver hemangioma 08/14/2016   Neuromuscular disorder ([place])    chronic inflammatory demyelinating polyneuropathy    New onset seizure ([place]) 07/08/2017   seizure 07/14/18   Nuclear sclerosis of both eyes 10/25/2016   Presence of permanent cardiac pacemaker    placed in april 2018   Primary open angle glaucoma of both eyes, indeterminate stage 10/25/2016   Renal mass, right 08/14/2016   Status cardiac pacemaker 01/29/2017   Placed for second degree heart block on 01/16/17 Medtronic Azure XT DR MRI SureScan dual-chamber pacemaker   Stroke [facility])    2011 with residual deficit left sided weakness  Tobacco dependence     PHYSICAL EXAM:   General: NAD  Chronically ill and frail appearing male in wheelchair CV:  S1S2, RRR Pulm:  Clear breath sounds throughout ABD:  Active BS.  Soft and non-tender Ext:   Neurological: Weakness with whispered speech  Darrell Lex, NP-C

## 2019-06-24 NOTE — Telephone Encounter (Addendum)
Call placed to [facility] # [phone] to check on status of referral.  Spoke to Olin Hauser who stated that patient was not admitted and she had no additional information to support that reason.  She transferred call to intake and the message stated that voicemail was not set up, try again later and call ended.   Attempted to contact [facility] twice more and calls rang, no one answered , no voicemail   Call placed to [facility] again, spoke to [place]. Call transferred to # [phone], message left with call back requested to this CM # [phone]  Message received from Gonzella Lex, NP stating that DME is being delivered 06/25/2019

## 2019-06-24 NOTE — Telephone Encounter (Signed)
Thanks for the update

## 2019-06-25 DIAGNOSIS — N189 Chronic kidney disease, unspecified: Secondary | ICD-10-CM | POA: Diagnosis not present

## 2019-06-25 NOTE — Telephone Encounter (Signed)
Call placed to [facility] # [phone] to inquire why the patient was not admitted for services. Spoke to [place] and transferred to Intake. Message left requesting call back .   Call received from [facility]. She said that they spoke to patient's family 06/17/2019 and were informed that patient's wife was in ICU and they did not want home health services at that time.  Case was closed. They will not accept verbal order to re-instate service. New referral needs to be faxed to # [phone].   Will need to have new referral placed by Dr Margarita Rana.

## 2019-06-26 DIAGNOSIS — I69342 Monoplegia of lower limb following cerebral infarction affecting left dominant side: Secondary | ICD-10-CM | POA: Diagnosis not present

## 2019-06-26 DIAGNOSIS — I69391 Dysphagia following cerebral infarction: Secondary | ICD-10-CM | POA: Diagnosis not present

## 2019-06-26 NOTE — Telephone Encounter (Signed)
New referral for home health faxed to [facility]

## 2019-06-26 NOTE — Telephone Encounter (Signed)
New referral has been placed.  °

## 2019-06-27 ENCOUNTER — Other Ambulatory Visit: Payer: Self-pay

## 2019-06-27 ENCOUNTER — Encounter: Payer: Self-pay | Admitting: Podiatry

## 2019-06-27 ENCOUNTER — Ambulatory Visit (INDEPENDENT_AMBULATORY_CARE_PROVIDER_SITE_OTHER): Payer: Medicare HMO | Admitting: Podiatry

## 2019-06-27 DIAGNOSIS — B351 Tinea unguium: Secondary | ICD-10-CM | POA: Diagnosis not present

## 2019-06-27 DIAGNOSIS — M79675 Pain in left toe(s): Secondary | ICD-10-CM

## 2019-06-27 DIAGNOSIS — M79674 Pain in right toe(s): Secondary | ICD-10-CM

## 2019-06-27 NOTE — Progress Notes (Signed)
Complaint:  Visit Type: Patient returns to my office for continued preventative foot care services. Complaint: Patient states" my nails have grown long and thick and become painful to walk and wear shoes" Patient has been diagnosed with DM with no foot complications. The patient presents for preventative foot care services. Patient is blind and is brought to the office by his nephew.  The patient wife is in the hospital.  Podiatric Exam: Vascular: dorsalis pedis and posterior tibial pulses are palpable bilateral. Capillary return is immediate. Temperature gradient is WNL. Skin turgor WNL  Sensorium: Normal Semmes Weinstein monofilament test. Normal tactile sensation bilaterally. Nail Exam: Pt has thick disfigured discolored nails with subungual debris noted bilateral entire nail hallux through fifth toenails Ulcer Exam: There is no evidence of ulcer or pre-ulcerative changes or infection. Orthopedic Exam: Muscle tone and strength are WNL. No limitations in general ROM. No crepitus or effusions noted. Foot type and digits show no abnormalities. HAV  B/L.  Hammer toes 2-5  B/L. Skin: No Porokeratosis. No infection or ulcers  Diagnosis:  Onychomycosis, , Pain in right toe, pain in left toes  Treatment & Plan Procedures and Treatment: Consent by patient was obtained for treatment procedures.   Debridement of mycotic and hypertrophic toenails, 1 through 5 bilateral and clearing of subungual debris. No ulceration, no infection noted.  Return Visit-Office Procedure: Patient instructed to return to the office for a follow up visit 3 months for continued evaluation and treatment.    Gardiner Barefoot DPM

## 2019-06-28 DIAGNOSIS — D631 Anemia in chronic kidney disease: Secondary | ICD-10-CM | POA: Diagnosis not present

## 2019-06-28 DIAGNOSIS — I441 Atrioventricular block, second degree: Secondary | ICD-10-CM | POA: Diagnosis not present

## 2019-06-28 DIAGNOSIS — I129 Hypertensive chronic kidney disease with stage 1 through stage 4 chronic kidney disease, or unspecified chronic kidney disease: Secondary | ICD-10-CM | POA: Diagnosis not present

## 2019-06-28 DIAGNOSIS — I69354 Hemiplegia and hemiparesis following cerebral infarction affecting left non-dominant side: Secondary | ICD-10-CM | POA: Diagnosis not present

## 2019-06-28 DIAGNOSIS — G1220 Motor neuron disease, unspecified: Secondary | ICD-10-CM | POA: Diagnosis not present

## 2019-06-28 DIAGNOSIS — N184 Chronic kidney disease, stage 4 (severe): Secondary | ICD-10-CM | POA: Diagnosis not present

## 2019-06-28 DIAGNOSIS — L89152 Pressure ulcer of sacral region, stage 2: Secondary | ICD-10-CM | POA: Diagnosis not present

## 2019-06-28 DIAGNOSIS — M1990 Unspecified osteoarthritis, unspecified site: Secondary | ICD-10-CM | POA: Diagnosis not present

## 2019-06-28 DIAGNOSIS — I69391 Dysphagia following cerebral infarction: Secondary | ICD-10-CM | POA: Diagnosis not present

## 2019-06-28 DIAGNOSIS — K219 Gastro-esophageal reflux disease without esophagitis: Secondary | ICD-10-CM | POA: Diagnosis not present

## 2019-07-01 ENCOUNTER — Telehealth: Payer: Self-pay

## 2019-07-01 DIAGNOSIS — L89892 Pressure ulcer of other site, stage 2: Secondary | ICD-10-CM | POA: Diagnosis not present

## 2019-07-01 NOTE — Telephone Encounter (Signed)
Call placed to [facility] to check status of referral. Spoke to April who stated that they first  saw the patient on 06/28/2019.

## 2019-07-02 DIAGNOSIS — M1990 Unspecified osteoarthritis, unspecified site: Secondary | ICD-10-CM | POA: Diagnosis not present

## 2019-07-02 DIAGNOSIS — I69391 Dysphagia following cerebral infarction: Secondary | ICD-10-CM | POA: Diagnosis not present

## 2019-07-02 DIAGNOSIS — I441 Atrioventricular block, second degree: Secondary | ICD-10-CM | POA: Diagnosis not present

## 2019-07-02 DIAGNOSIS — I129 Hypertensive chronic kidney disease with stage 1 through stage 4 chronic kidney disease, or unspecified chronic kidney disease: Secondary | ICD-10-CM | POA: Diagnosis not present

## 2019-07-02 DIAGNOSIS — N184 Chronic kidney disease, stage 4 (severe): Secondary | ICD-10-CM | POA: Diagnosis not present

## 2019-07-02 DIAGNOSIS — G1220 Motor neuron disease, unspecified: Secondary | ICD-10-CM | POA: Diagnosis not present

## 2019-07-02 DIAGNOSIS — L89152 Pressure ulcer of sacral region, stage 2: Secondary | ICD-10-CM | POA: Diagnosis not present

## 2019-07-02 DIAGNOSIS — K219 Gastro-esophageal reflux disease without esophagitis: Secondary | ICD-10-CM | POA: Diagnosis not present

## 2019-07-02 DIAGNOSIS — I69354 Hemiplegia and hemiparesis following cerebral infarction affecting left non-dominant side: Secondary | ICD-10-CM | POA: Diagnosis not present

## 2019-07-02 DIAGNOSIS — D631 Anemia in chronic kidney disease: Secondary | ICD-10-CM | POA: Diagnosis not present

## 2019-07-03 NOTE — Progress Notes (Signed)
Remote pacemaker transmission.   

## 2019-07-04 DIAGNOSIS — G1220 Motor neuron disease, unspecified: Secondary | ICD-10-CM | POA: Diagnosis not present

## 2019-07-04 DIAGNOSIS — I69354 Hemiplegia and hemiparesis following cerebral infarction affecting left non-dominant side: Secondary | ICD-10-CM | POA: Diagnosis not present

## 2019-07-04 DIAGNOSIS — L89152 Pressure ulcer of sacral region, stage 2: Secondary | ICD-10-CM | POA: Diagnosis not present

## 2019-07-04 DIAGNOSIS — M1990 Unspecified osteoarthritis, unspecified site: Secondary | ICD-10-CM | POA: Diagnosis not present

## 2019-07-04 DIAGNOSIS — I441 Atrioventricular block, second degree: Secondary | ICD-10-CM | POA: Diagnosis not present

## 2019-07-04 DIAGNOSIS — I69391 Dysphagia following cerebral infarction: Secondary | ICD-10-CM | POA: Diagnosis not present

## 2019-07-04 DIAGNOSIS — I129 Hypertensive chronic kidney disease with stage 1 through stage 4 chronic kidney disease, or unspecified chronic kidney disease: Secondary | ICD-10-CM | POA: Diagnosis not present

## 2019-07-04 DIAGNOSIS — N184 Chronic kidney disease, stage 4 (severe): Secondary | ICD-10-CM | POA: Diagnosis not present

## 2019-07-04 DIAGNOSIS — D631 Anemia in chronic kidney disease: Secondary | ICD-10-CM | POA: Diagnosis not present

## 2019-07-04 DIAGNOSIS — L89892 Pressure ulcer of other site, stage 2: Secondary | ICD-10-CM | POA: Diagnosis not present

## 2019-07-04 DIAGNOSIS — K219 Gastro-esophageal reflux disease without esophagitis: Secondary | ICD-10-CM | POA: Diagnosis not present

## 2019-07-08 ENCOUNTER — Telehealth: Payer: Self-pay | Admitting: Family Medicine

## 2019-07-08 DIAGNOSIS — G1220 Motor neuron disease, unspecified: Secondary | ICD-10-CM | POA: Diagnosis not present

## 2019-07-08 DIAGNOSIS — I129 Hypertensive chronic kidney disease with stage 1 through stage 4 chronic kidney disease, or unspecified chronic kidney disease: Secondary | ICD-10-CM | POA: Diagnosis not present

## 2019-07-08 DIAGNOSIS — D631 Anemia in chronic kidney disease: Secondary | ICD-10-CM | POA: Diagnosis not present

## 2019-07-08 DIAGNOSIS — I441 Atrioventricular block, second degree: Secondary | ICD-10-CM | POA: Diagnosis not present

## 2019-07-08 DIAGNOSIS — N184 Chronic kidney disease, stage 4 (severe): Secondary | ICD-10-CM | POA: Diagnosis not present

## 2019-07-08 DIAGNOSIS — M1990 Unspecified osteoarthritis, unspecified site: Secondary | ICD-10-CM | POA: Diagnosis not present

## 2019-07-08 DIAGNOSIS — I69391 Dysphagia following cerebral infarction: Secondary | ICD-10-CM | POA: Diagnosis not present

## 2019-07-08 DIAGNOSIS — I69354 Hemiplegia and hemiparesis following cerebral infarction affecting left non-dominant side: Secondary | ICD-10-CM | POA: Diagnosis not present

## 2019-07-08 DIAGNOSIS — K219 Gastro-esophageal reflux disease without esophagitis: Secondary | ICD-10-CM | POA: Diagnosis not present

## 2019-07-08 DIAGNOSIS — L89152 Pressure ulcer of sacral region, stage 2: Secondary | ICD-10-CM | POA: Diagnosis not present

## 2019-07-08 NOTE — Telephone Encounter (Signed)
Patient was called and there was no answer.  RMA will try call again tomorrow.

## 2019-07-08 NOTE — Telephone Encounter (Signed)
Patient needs speech therapy evaluation. Heart rate today was 41 to 49 bpm at rest and with activity

## 2019-07-08 NOTE — Telephone Encounter (Signed)
Patient wife called wanting to get clarification on what medication he should be taking. Please follow up.

## 2019-07-08 NOTE — Telephone Encounter (Signed)
Darrell [name] was given verbal orders for speech therapy.  She states that patient heart rate was low during today's visit.

## 2019-07-09 DIAGNOSIS — K219 Gastro-esophageal reflux disease without esophagitis: Secondary | ICD-10-CM | POA: Diagnosis not present

## 2019-07-09 DIAGNOSIS — I129 Hypertensive chronic kidney disease with stage 1 through stage 4 chronic kidney disease, or unspecified chronic kidney disease: Secondary | ICD-10-CM | POA: Diagnosis not present

## 2019-07-09 DIAGNOSIS — G1220 Motor neuron disease, unspecified: Secondary | ICD-10-CM | POA: Diagnosis not present

## 2019-07-09 DIAGNOSIS — M1990 Unspecified osteoarthritis, unspecified site: Secondary | ICD-10-CM | POA: Diagnosis not present

## 2019-07-09 DIAGNOSIS — N184 Chronic kidney disease, stage 4 (severe): Secondary | ICD-10-CM | POA: Diagnosis not present

## 2019-07-09 DIAGNOSIS — I69391 Dysphagia following cerebral infarction: Secondary | ICD-10-CM | POA: Diagnosis not present

## 2019-07-09 DIAGNOSIS — I69354 Hemiplegia and hemiparesis following cerebral infarction affecting left non-dominant side: Secondary | ICD-10-CM | POA: Diagnosis not present

## 2019-07-09 DIAGNOSIS — D631 Anemia in chronic kidney disease: Secondary | ICD-10-CM | POA: Diagnosis not present

## 2019-07-09 DIAGNOSIS — L89152 Pressure ulcer of sacral region, stage 2: Secondary | ICD-10-CM | POA: Diagnosis not present

## 2019-07-09 DIAGNOSIS — I441 Atrioventricular block, second degree: Secondary | ICD-10-CM | POA: Diagnosis not present

## 2019-07-09 NOTE — Telephone Encounter (Signed)
Patient was called and a voicemail was left informing patient to return phone call. 

## 2019-07-10 ENCOUNTER — Telehealth: Payer: Self-pay | Admitting: Family Medicine

## 2019-07-10 ENCOUNTER — Telehealth: Payer: Self-pay | Admitting: Cardiology

## 2019-07-10 DIAGNOSIS — I69354 Hemiplegia and hemiparesis following cerebral infarction affecting left non-dominant side: Secondary | ICD-10-CM | POA: Diagnosis not present

## 2019-07-10 DIAGNOSIS — I129 Hypertensive chronic kidney disease with stage 1 through stage 4 chronic kidney disease, or unspecified chronic kidney disease: Secondary | ICD-10-CM | POA: Diagnosis not present

## 2019-07-10 DIAGNOSIS — I69391 Dysphagia following cerebral infarction: Secondary | ICD-10-CM | POA: Diagnosis not present

## 2019-07-10 DIAGNOSIS — D631 Anemia in chronic kidney disease: Secondary | ICD-10-CM | POA: Diagnosis not present

## 2019-07-10 DIAGNOSIS — N184 Chronic kidney disease, stage 4 (severe): Secondary | ICD-10-CM | POA: Diagnosis not present

## 2019-07-10 DIAGNOSIS — G1220 Motor neuron disease, unspecified: Secondary | ICD-10-CM | POA: Diagnosis not present

## 2019-07-10 DIAGNOSIS — L89152 Pressure ulcer of sacral region, stage 2: Secondary | ICD-10-CM | POA: Diagnosis not present

## 2019-07-10 DIAGNOSIS — I441 Atrioventricular block, second degree: Secondary | ICD-10-CM | POA: Diagnosis not present

## 2019-07-10 DIAGNOSIS — M1990 Unspecified osteoarthritis, unspecified site: Secondary | ICD-10-CM | POA: Diagnosis not present

## 2019-07-10 DIAGNOSIS — K219 Gastro-esophageal reflux disease without esophagitis: Secondary | ICD-10-CM | POA: Diagnosis not present

## 2019-07-10 NOTE — Telephone Encounter (Signed)
Med list reviewed with pt's wife and pt has f/u with Dr Curt Bears on 07-22-19 at 4:00 pm .cy

## 2019-07-10 NOTE — Telephone Encounter (Signed)
New Message   Beth a speech therapist is calling to request orders for speech therapy for 2x's week for 4 weeks. Please f/u

## 2019-07-10 NOTE — Telephone Encounter (Signed)
° ° °  Spouse calling to review med list.

## 2019-07-11 DIAGNOSIS — N184 Chronic kidney disease, stage 4 (severe): Secondary | ICD-10-CM | POA: Diagnosis not present

## 2019-07-11 DIAGNOSIS — I69391 Dysphagia following cerebral infarction: Secondary | ICD-10-CM | POA: Diagnosis not present

## 2019-07-11 DIAGNOSIS — I69354 Hemiplegia and hemiparesis following cerebral infarction affecting left non-dominant side: Secondary | ICD-10-CM | POA: Diagnosis not present

## 2019-07-11 DIAGNOSIS — M1990 Unspecified osteoarthritis, unspecified site: Secondary | ICD-10-CM | POA: Diagnosis not present

## 2019-07-11 DIAGNOSIS — I441 Atrioventricular block, second degree: Secondary | ICD-10-CM | POA: Diagnosis not present

## 2019-07-11 DIAGNOSIS — G1220 Motor neuron disease, unspecified: Secondary | ICD-10-CM | POA: Diagnosis not present

## 2019-07-11 DIAGNOSIS — K219 Gastro-esophageal reflux disease without esophagitis: Secondary | ICD-10-CM | POA: Diagnosis not present

## 2019-07-11 DIAGNOSIS — D631 Anemia in chronic kidney disease: Secondary | ICD-10-CM | POA: Diagnosis not present

## 2019-07-11 DIAGNOSIS — I129 Hypertensive chronic kidney disease with stage 1 through stage 4 chronic kidney disease, or unspecified chronic kidney disease: Secondary | ICD-10-CM | POA: Diagnosis not present

## 2019-07-11 DIAGNOSIS — L89152 Pressure ulcer of sacral region, stage 2: Secondary | ICD-10-CM | POA: Diagnosis not present

## 2019-07-14 DIAGNOSIS — M1990 Unspecified osteoarthritis, unspecified site: Secondary | ICD-10-CM | POA: Diagnosis not present

## 2019-07-14 DIAGNOSIS — G1220 Motor neuron disease, unspecified: Secondary | ICD-10-CM | POA: Diagnosis not present

## 2019-07-14 DIAGNOSIS — K219 Gastro-esophageal reflux disease without esophagitis: Secondary | ICD-10-CM | POA: Diagnosis not present

## 2019-07-14 DIAGNOSIS — I129 Hypertensive chronic kidney disease with stage 1 through stage 4 chronic kidney disease, or unspecified chronic kidney disease: Secondary | ICD-10-CM | POA: Diagnosis not present

## 2019-07-14 DIAGNOSIS — N184 Chronic kidney disease, stage 4 (severe): Secondary | ICD-10-CM | POA: Diagnosis not present

## 2019-07-14 DIAGNOSIS — D631 Anemia in chronic kidney disease: Secondary | ICD-10-CM | POA: Diagnosis not present

## 2019-07-14 DIAGNOSIS — I441 Atrioventricular block, second degree: Secondary | ICD-10-CM | POA: Diagnosis not present

## 2019-07-14 DIAGNOSIS — L89152 Pressure ulcer of sacral region, stage 2: Secondary | ICD-10-CM | POA: Diagnosis not present

## 2019-07-14 DIAGNOSIS — I69354 Hemiplegia and hemiparesis following cerebral infarction affecting left non-dominant side: Secondary | ICD-10-CM | POA: Diagnosis not present

## 2019-07-14 DIAGNOSIS — I69391 Dysphagia following cerebral infarction: Secondary | ICD-10-CM | POA: Diagnosis not present

## 2019-07-15 DIAGNOSIS — I69354 Hemiplegia and hemiparesis following cerebral infarction affecting left non-dominant side: Secondary | ICD-10-CM | POA: Diagnosis not present

## 2019-07-15 DIAGNOSIS — I69391 Dysphagia following cerebral infarction: Secondary | ICD-10-CM | POA: Diagnosis not present

## 2019-07-15 DIAGNOSIS — I129 Hypertensive chronic kidney disease with stage 1 through stage 4 chronic kidney disease, or unspecified chronic kidney disease: Secondary | ICD-10-CM | POA: Diagnosis not present

## 2019-07-15 DIAGNOSIS — D631 Anemia in chronic kidney disease: Secondary | ICD-10-CM | POA: Diagnosis not present

## 2019-07-15 DIAGNOSIS — M1990 Unspecified osteoarthritis, unspecified site: Secondary | ICD-10-CM | POA: Diagnosis not present

## 2019-07-15 DIAGNOSIS — I441 Atrioventricular block, second degree: Secondary | ICD-10-CM | POA: Diagnosis not present

## 2019-07-15 DIAGNOSIS — K219 Gastro-esophageal reflux disease without esophagitis: Secondary | ICD-10-CM | POA: Diagnosis not present

## 2019-07-15 DIAGNOSIS — N184 Chronic kidney disease, stage 4 (severe): Secondary | ICD-10-CM | POA: Diagnosis not present

## 2019-07-15 DIAGNOSIS — G1220 Motor neuron disease, unspecified: Secondary | ICD-10-CM | POA: Diagnosis not present

## 2019-07-15 DIAGNOSIS — L89152 Pressure ulcer of sacral region, stage 2: Secondary | ICD-10-CM | POA: Diagnosis not present

## 2019-07-15 NOTE — Telephone Encounter (Signed)
The phone number listed is to the patient not the therapist.

## 2019-07-16 ENCOUNTER — Telehealth: Payer: Self-pay | Admitting: Family Medicine

## 2019-07-16 DIAGNOSIS — I69391 Dysphagia following cerebral infarction: Secondary | ICD-10-CM | POA: Diagnosis not present

## 2019-07-16 DIAGNOSIS — K219 Gastro-esophageal reflux disease without esophagitis: Secondary | ICD-10-CM | POA: Diagnosis not present

## 2019-07-16 DIAGNOSIS — D631 Anemia in chronic kidney disease: Secondary | ICD-10-CM | POA: Diagnosis not present

## 2019-07-16 DIAGNOSIS — M1990 Unspecified osteoarthritis, unspecified site: Secondary | ICD-10-CM | POA: Diagnosis not present

## 2019-07-16 DIAGNOSIS — G1220 Motor neuron disease, unspecified: Secondary | ICD-10-CM | POA: Diagnosis not present

## 2019-07-16 DIAGNOSIS — I441 Atrioventricular block, second degree: Secondary | ICD-10-CM | POA: Diagnosis not present

## 2019-07-16 DIAGNOSIS — N184 Chronic kidney disease, stage 4 (severe): Secondary | ICD-10-CM | POA: Diagnosis not present

## 2019-07-16 DIAGNOSIS — I69354 Hemiplegia and hemiparesis following cerebral infarction affecting left non-dominant side: Secondary | ICD-10-CM | POA: Diagnosis not present

## 2019-07-16 DIAGNOSIS — I129 Hypertensive chronic kidney disease with stage 1 through stage 4 chronic kidney disease, or unspecified chronic kidney disease: Secondary | ICD-10-CM | POA: Diagnosis not present

## 2019-07-16 DIAGNOSIS — L89152 Pressure ulcer of sacral region, stage 2: Secondary | ICD-10-CM | POA: Diagnosis not present

## 2019-07-16 NOTE — Telephone Encounter (Signed)
Attempted to contact [facility] rep and phone number listed on encounter is for the patient.

## 2019-07-16 NOTE — Telephone Encounter (Signed)
New Message  Lattie Haw from [facility] is calling to request an order for social work for [employer]. Please f/u

## 2019-07-18 DIAGNOSIS — N184 Chronic kidney disease, stage 4 (severe): Secondary | ICD-10-CM | POA: Diagnosis not present

## 2019-07-18 DIAGNOSIS — G1220 Motor neuron disease, unspecified: Secondary | ICD-10-CM | POA: Diagnosis not present

## 2019-07-18 DIAGNOSIS — K219 Gastro-esophageal reflux disease without esophagitis: Secondary | ICD-10-CM | POA: Diagnosis not present

## 2019-07-18 DIAGNOSIS — I69354 Hemiplegia and hemiparesis following cerebral infarction affecting left non-dominant side: Secondary | ICD-10-CM | POA: Diagnosis not present

## 2019-07-18 DIAGNOSIS — I129 Hypertensive chronic kidney disease with stage 1 through stage 4 chronic kidney disease, or unspecified chronic kidney disease: Secondary | ICD-10-CM | POA: Diagnosis not present

## 2019-07-18 DIAGNOSIS — M1990 Unspecified osteoarthritis, unspecified site: Secondary | ICD-10-CM | POA: Diagnosis not present

## 2019-07-18 DIAGNOSIS — D631 Anemia in chronic kidney disease: Secondary | ICD-10-CM | POA: Diagnosis not present

## 2019-07-18 DIAGNOSIS — L89152 Pressure ulcer of sacral region, stage 2: Secondary | ICD-10-CM | POA: Diagnosis not present

## 2019-07-18 DIAGNOSIS — I69391 Dysphagia following cerebral infarction: Secondary | ICD-10-CM | POA: Diagnosis not present

## 2019-07-18 DIAGNOSIS — I441 Atrioventricular block, second degree: Secondary | ICD-10-CM | POA: Diagnosis not present

## 2019-07-21 ENCOUNTER — Encounter: Payer: Self-pay | Admitting: Cardiology

## 2019-07-21 DIAGNOSIS — N184 Chronic kidney disease, stage 4 (severe): Secondary | ICD-10-CM | POA: Diagnosis not present

## 2019-07-21 DIAGNOSIS — I129 Hypertensive chronic kidney disease with stage 1 through stage 4 chronic kidney disease, or unspecified chronic kidney disease: Secondary | ICD-10-CM | POA: Diagnosis not present

## 2019-07-21 DIAGNOSIS — G1220 Motor neuron disease, unspecified: Secondary | ICD-10-CM | POA: Diagnosis not present

## 2019-07-21 DIAGNOSIS — L89152 Pressure ulcer of sacral region, stage 2: Secondary | ICD-10-CM | POA: Diagnosis not present

## 2019-07-21 DIAGNOSIS — M1990 Unspecified osteoarthritis, unspecified site: Secondary | ICD-10-CM | POA: Diagnosis not present

## 2019-07-21 DIAGNOSIS — I69354 Hemiplegia and hemiparesis following cerebral infarction affecting left non-dominant side: Secondary | ICD-10-CM | POA: Diagnosis not present

## 2019-07-21 DIAGNOSIS — D631 Anemia in chronic kidney disease: Secondary | ICD-10-CM | POA: Diagnosis not present

## 2019-07-21 DIAGNOSIS — I441 Atrioventricular block, second degree: Secondary | ICD-10-CM | POA: Diagnosis not present

## 2019-07-21 DIAGNOSIS — K219 Gastro-esophageal reflux disease without esophagitis: Secondary | ICD-10-CM | POA: Diagnosis not present

## 2019-07-21 DIAGNOSIS — I69391 Dysphagia following cerebral infarction: Secondary | ICD-10-CM | POA: Diagnosis not present

## 2019-07-22 ENCOUNTER — Other Ambulatory Visit: Payer: Self-pay

## 2019-07-22 ENCOUNTER — Encounter: Payer: Self-pay | Admitting: Cardiology

## 2019-07-22 ENCOUNTER — Ambulatory Visit (INDEPENDENT_AMBULATORY_CARE_PROVIDER_SITE_OTHER): Payer: Medicare HMO | Admitting: Cardiology

## 2019-07-22 VITALS — BP 122/74 | HR 43 | Ht 74.0 in | Wt 165.0 lb

## 2019-07-22 DIAGNOSIS — I493 Ventricular premature depolarization: Secondary | ICD-10-CM | POA: Diagnosis not present

## 2019-07-22 DIAGNOSIS — I129 Hypertensive chronic kidney disease with stage 1 through stage 4 chronic kidney disease, or unspecified chronic kidney disease: Secondary | ICD-10-CM | POA: Diagnosis not present

## 2019-07-22 DIAGNOSIS — I69354 Hemiplegia and hemiparesis following cerebral infarction affecting left non-dominant side: Secondary | ICD-10-CM | POA: Diagnosis not present

## 2019-07-22 DIAGNOSIS — N184 Chronic kidney disease, stage 4 (severe): Secondary | ICD-10-CM | POA: Diagnosis not present

## 2019-07-22 DIAGNOSIS — D631 Anemia in chronic kidney disease: Secondary | ICD-10-CM | POA: Diagnosis not present

## 2019-07-22 DIAGNOSIS — I441 Atrioventricular block, second degree: Secondary | ICD-10-CM | POA: Diagnosis not present

## 2019-07-22 DIAGNOSIS — G1220 Motor neuron disease, unspecified: Secondary | ICD-10-CM | POA: Diagnosis not present

## 2019-07-22 DIAGNOSIS — K219 Gastro-esophageal reflux disease without esophagitis: Secondary | ICD-10-CM | POA: Diagnosis not present

## 2019-07-22 DIAGNOSIS — M1990 Unspecified osteoarthritis, unspecified site: Secondary | ICD-10-CM | POA: Diagnosis not present

## 2019-07-22 DIAGNOSIS — L89152 Pressure ulcer of sacral region, stage 2: Secondary | ICD-10-CM | POA: Diagnosis not present

## 2019-07-22 DIAGNOSIS — I69391 Dysphagia following cerebral infarction: Secondary | ICD-10-CM | POA: Diagnosis not present

## 2019-07-22 NOTE — Progress Notes (Signed)
Electrophysiology Office Note   Date:  07/22/2019   ID:  Darrell, [name] 1951/09/18, MRN [id number]  PCP:  Darrell Rakes, MD  Cardiologist:  none Primary Electrophysiologist:  Darrell Diehl Meredith Leeds, MD    No chief complaint on file.    History of Present Illness: Darrell [name] is a 68 y.o. male who is being seen today for the evaluation of heart block at the request of Darrell Rakes, MD. Presenting today for electrophysiology evaluation. Presented to the hospital when he discovered his HR was in the 40s, found to be in heart block with 2:1 conduction. Medtronic dual chamber pacemaker implanted 01/16/17.  Today, denies symptoms of palpitations, chest pain, shortness of breath, orthopnea, PND, lower extremity edema, claudication, dizziness, presyncope, syncope, bleeding, or neurologic sequela. The patient is tolerating medications without difficulties.  He is currently feeling well without major issue today.  He has had multiple hospitalizations over the past 4 convulsions that were thought to be seizures.  He does have follow-up scheduled with neurology.  Past Medical History:  Diagnosis Date  . Arthritis   . At high risk for falls 08/16/2015  . Bradycardia   . Chronic kidney disease    stage 3 GFR 30-59 ml/min   . CIDP (chronic inflammatory demyelinating polyneuropathy) ([place])   . CKD (chronic kidney disease) stage 3, GFR 30-59 ml/min 08/16/2015  . Dysphagia as late effect of cerebrovascular disease    pts wife states pt has to eat soft foods   . Elevated liver enzymes 08/10/2016  . GERD (gastroesophageal reflux disease)   . Glaucoma   . High cholesterol   . History of CVA with residual deficit 03/25/2013  . Hypertension   . Hypertensive retinopathy of both eyes 01/16/2017  . Inguinal hernia 03/25/2013  . Liver hemangioma 08/14/2016  . Neuromuscular disorder ([place])    chronic inflammatory demyelinating polyneuropathy   . New onset seizure ([place]) 07/08/2017   seizure 07/14/18   . Nuclear sclerosis of both eyes 10/25/2016  . Presence of permanent cardiac pacemaker    placed in april 2018  . Primary open angle glaucoma of both eyes, indeterminate stage 10/25/2016  . Renal mass, right 08/14/2016  . Status cardiac pacemaker 01/29/2017   Placed for second degree heart block on 01/16/17 Medtronic Azure XT DR MRI SureScan dual-chamber pacemaker  . Stroke [facility])    2011 with residual deficit left sided weakness  . Tobacco dependence    Past Surgical History:  Procedure Laterality Date  . EYE SURGERY    . HERNIA REPAIR    . INGUINAL HERNIA REPAIR Right 11/23/2014   Procedure: right inguinal hernia repair with mesh;  Surgeon: Darrell Gemma, MD;  Location: [facility];  Service: General;  Laterality: Right;  . INSERTION OF MESH N/A 11/23/2014   Procedure: INSERTION OF MESH;  Surgeon: Darrell Gemma, MD;  Location: [facility];  Service: General;  Laterality: N/A;  . MASS EXCISION Left 08/29/2017   Procedure: EXCISION OF LEFT NECK MASS;  Surgeon: Darrell Keens, MD;  Location: [facility];  Service: General;  Laterality: Left;  . PACEMAKER IMPLANT N/A 01/16/2017   Procedure: Pacemaker Implant;  Surgeon: Darrell Archbold Meredith Leeds, MD;  Location: [place] [facility];  Service: Cardiovascular;  Laterality: N/A;  . SHOULDER SURGERY Bilateral 1988, 1998     Current Outpatient Medications  Medication Sig Dispense Refill  . aspirin EC 81 MG tablet Take 1 tablet (81 mg total) by mouth daily. 90 tablet 3  . atorvastatin (LIPITOR) 40 MG  tablet Take 1 tablet (40 mg total) by mouth daily. 90 tablet 1  . brimonidine (ALPHAGAN P) 0.1 % SOLN Place 1 drop into both eyes daily.     . dorzolamide-timolol (COSOPT) 22.3-6.8 MG/ML ophthalmic solution Place 1 drop into both eyes daily.     . ferrous sulfate 325 (65 FE) MG tablet Take 1 tablet (325 mg total) by mouth 2 (two) times daily with a meal. 60 tablet 3  . lactulose (CHRONULAC) 10 GM/15ML solution Take 15 mLs (10 g total) by mouth daily. 946 mL 1  . latanoprost  (XALATAN) 0.005 % ophthalmic solution Place 1 drop into both eyes at bedtime.    . levETIRAcetam (KEPPRA) 500 MG tablet Take 1 tablet (500 mg total) by mouth 2 (two) times daily. 180 tablet 4  . metoprolol tartrate (LOPRESSOR) 50 MG tablet Take 50 mg by mouth 2 (two) times daily.    . Multiple Vitamins-Iron (DAILY VITAMIN FORMULA+IRON) TABS Take 1 tablet by mouth daily.    . polyethylene glycol (MIRALAX / GLYCOLAX) packet Take 17 g by mouth daily as needed for mild constipation. 30 each 3  . tamsulosin (FLOMAX) 0.4 MG CAPS capsule Take 1 capsule by mouth once daily 90 capsule 0   No current facility-administered medications for this visit.     Allergies:   Ace inhibitors, Doxycycline, Atacand hct [candesartan cilexetil-hctz], and Shellfish allergy   Social History:  The patient  reports that he has been smoking cigarettes. He has a 40.00 pack-year smoking history. He has never used smokeless tobacco. He reports that he does not drink alcohol or use drugs.   Family History:  The patient's family history includes COPD in his sister; Cancer in his sister; Diabetes in his sister; Hypertension in his mother and sister.    ROS:  Please see the history of present illness.   Otherwise, review of systems is positive for weight loss, appetite change, chills, visual changes, cough, snoring, constipation, anxiety.   All other systems are reviewed and negative.   PHYSICAL EXAM: VS:  BP 122/74   Pulse (!) 43   Ht 6\' 2"  (1.88 m)   Wt 165 lb (74.8 kg)   SpO2 98%   BMI 21.18 kg/m  , BMI Body mass index is 21.18 kg/m. GEN: Well nourished, well developed, in no acute distress  HEENT: normal  Neck: no JVD, carotid bruits, or masses Cardiac: RRR; no murmurs, rubs, or gallops,no edema  Respiratory:  clear to auscultation bilaterally, normal work of breathing GI: soft, nontender, nondistended, + BS MS: no deformity or atrophy  Skin: warm and dry, device site well healed Neuro:  Strength and sensation  are intact Psych: euthymic mood, full affect  EKG:  EKG is not ordered today. Personal review of the ekg ordered 07/14/18 shows sinus rhythm, rate 79  Personal review of the device interrogation today. Results in [place]: 05/05/2019: ALT 9; TSH 1.251 05/07/2019: Magnesium 1.9 05/08/2019: BUN 64; Creatinine, Ser 2.78; Hemoglobin 8.3; Platelets 180; Potassium 4.5; Sodium 138    Lipid Panel     Component Value Date/Time   CHOL 112 06/13/2018 0954   TRIG 51 06/13/2018 0954   HDL 40 06/13/2018 0954   CHOLHDL 2.8 06/13/2018 0954   CHOLHDL 4.1 08/16/2015 1555   VLDL 23 08/16/2015 1555   LDLCALC 62 06/13/2018 0954     Wt Readings from Last 3 Encounters:  07/22/19 165 lb (74.8 kg)  05/20/19 162 lb (73.5 kg)  05/08/19 167 lb  1.7 oz (75.8 kg)      Other studies Reviewed: Additional studies/ records that were reviewed today include: TTE 2014  Review of the above records today demonstrates:  - Left ventricle: The cavity size was normal. There was severe concentric and mild asymmetric hypertrophy. Systolic function was normal. Wall motion was normal; there were no regional wall motion abnormalities. Doppler parameters are consistent with abnormal left ventricular relaxation (grade 1 diastolic dysfunction). - Aortic root: The aortic root was mildly dilated. - Right atrium: The atrium was mildly dilated. - Pulmonary arteries: Systolic pressure was mildly increased. PA peak pressure: 14mm Hg (S).  ASSESSMENT AND PLAN:  1.  2:1 AV block: Status post Medtronic dual-chamber pacemaker implanted 01/16/2017.  Is functioning appropriately.  No changes at this time.  2. Hypertension: Mildly elevated today but has been well controlled in the past.  No changes.  Current medicines are reviewed at length with the patient today.   The patient does not have concerns regarding his medicines.  The following changes were made today: None  Labs/ tests ordered today  include:  Orders Placed This Encounter  Procedures  . EKG 12-Lead     Disposition:   FU with Eun Vermeer 12 months  Signed, Kendry Pfarr Meredith Leeds, MD  07/22/2019 4:33 PM     [place] [address] [phone] (office) [phone] (fax)    Electrophysiology Office Note   Date:  07/22/2019   ID:  Jakeel, Starliper [birth year], MRN [id number]  PCP:  Darrell Rakes, MD  Cardiologist:  none Primary Electrophysiologist:  Shalena Ezzell Meredith Leeds, MD    No chief complaint on file.    History of Present Illness: Darrell [name] is a 68 y.o. male who is being seen today for the evaluation of heart block at the request of Darrell Rakes, MD. Presenting today for electrophysiology evaluation. Presented to the hospital when he discovered his HR was in the 40s, found to be in heart block with 2:1 conduction. Medtronic dual chamber pacemaker implanted 01/16/17.  Today, denies symptoms of palpitations, chest pain, shortness of breath, orthopnea, PND, lower extremity edema, claudication, dizziness, presyncope, syncope, bleeding, or neurologic sequela. The patient is tolerating medications without difficulties.  Overall he is doing well.  He is working with physical therapy at home.  He is trying to be more active and independent.  He continues to be in a wheelchair.  Past Medical History:  Diagnosis Date  . Arthritis   . At high risk for falls 08/16/2015  . Bradycardia   . Chronic kidney disease    stage 3 GFR 30-59 ml/min   . CIDP (chronic inflammatory demyelinating polyneuropathy) ([place])   . CKD (chronic kidney disease) stage 3, GFR 30-59 ml/min 08/16/2015  . Dysphagia as late effect of cerebrovascular disease    pts wife states pt has to eat soft foods   . Elevated liver enzymes 08/10/2016  . GERD (gastroesophageal reflux disease)   . Glaucoma   . High cholesterol   . History of CVA with residual deficit 03/25/2013  . Hypertension    . Hypertensive retinopathy of both eyes 01/16/2017  . Inguinal hernia 03/25/2013  . Liver hemangioma 08/14/2016  . Neuromuscular disorder ([place])    chronic inflammatory demyelinating polyneuropathy   . New onset seizure ([place]) 07/08/2017   seizure 07/14/18  . Nuclear sclerosis of both eyes 10/25/2016  . Presence of permanent cardiac pacemaker    placed in april 2018  . Primary  open angle glaucoma of both eyes, indeterminate stage 10/25/2016  . Renal mass, right 08/14/2016  . Status cardiac pacemaker 01/29/2017   Placed for second degree heart block on 01/16/17 Medtronic Azure XT DR MRI SureScan dual-chamber pacemaker  . Stroke [facility])    2011 with residual deficit left sided weakness  . Tobacco dependence    Past Surgical History:  Procedure Laterality Date  . EYE SURGERY    . HERNIA REPAIR    . INGUINAL HERNIA REPAIR Right 11/23/2014   Procedure: right inguinal hernia repair with mesh;  Surgeon: Darrell Gemma, MD;  [facility];  Service: General;  Laterality: Right;  . INSERTION OF MESH N/A 11/23/2014   Procedure: INSERTION OF MESH;  Surgeon: Darrell Gemma, MD;  [facility];  Service: General;  Laterality: N/A;  . MASS EXCISION Left 08/29/2017   Procedure: EXCISION OF LEFT NECK MASS;  Surgeon: Darrell Keens, MD;  Location: [place];  Service: General;  Laterality: Left;  . PACEMAKER IMPLANT N/A 01/16/2017   Procedure: Pacemaker Implant;  Surgeon: Demecia Northway Meredith Leeds, MD;  Location: [place] [facility];  Service: Cardiovascular;  Laterality: N/A;  . SHOULDER SURGERY Bilateral 1988, 1998     Current Outpatient Medications  Medication Sig Dispense Refill  . aspirin EC 81 MG tablet Take 1 tablet (81 mg total) by mouth daily. 90 tablet 3  . atorvastatin (LIPITOR) 40 MG tablet Take 1 tablet (40 mg total) by mouth daily. 90 tablet 1  . brimonidine (ALPHAGAN P) 0.1 % SOLN Place 1 drop into both eyes daily.     . dorzolamide-timolol (COSOPT) 22.3-6.8 MG/ML ophthalmic solution Place 1 drop into  both eyes daily.     . ferrous sulfate 325 (65 FE) MG tablet Take 1 tablet (325 mg total) by mouth 2 (two) times daily with a meal. 60 tablet 3  . lactulose (CHRONULAC) 10 GM/15ML solution Take 15 mLs (10 g total) by mouth daily. 946 mL 1  . latanoprost (XALATAN) 0.005 % ophthalmic solution Place 1 drop into both eyes at bedtime.    . levETIRAcetam (KEPPRA) 500 MG tablet Take 1 tablet (500 mg total) by mouth 2 (two) times daily. 180 tablet 4  . metoprolol tartrate (LOPRESSOR) 50 MG tablet Take 50 mg by mouth 2 (two) times daily.    . Multiple Vitamins-Iron (DAILY VITAMIN FORMULA+IRON) TABS Take 1 tablet by mouth daily.    . polyethylene glycol (MIRALAX / GLYCOLAX) packet Take 17 g by mouth daily as needed for mild constipation. 30 each 3  . tamsulosin (FLOMAX) 0.4 MG CAPS capsule Take 1 capsule by mouth once daily 90 capsule 0   No current facility-administered medications for this visit.     Allergies:   Ace inhibitors, Doxycycline, Atacand hct [candesartan cilexetil-hctz], and Shellfish allergy   Social History:  The patient  reports that he has been smoking cigarettes. He has a 40.00 pack-year smoking history. He has never used smokeless tobacco. He reports that he does not drink alcohol or use drugs.   Family History:  The patient's family history includes COPD in his sister; Cancer in his sister; Diabetes in his sister; Hypertension in his mother and sister.   ROS:  Please see the history of present illness.   Otherwise, review of systems is positive for none.   All other systems are reviewed and negative.   PHYSICAL EXAM: VS:  BP 122/74   Pulse (!) 43   Ht 6\' 2"  (1.88 m)   Wt 165 lb (74.8  kg)   SpO2 98%   BMI 21.18 kg/m  , BMI Body mass index is 21.18 kg/m. GEN: Well nourished, well developed, in no acute distress  HEENT: normal  Neck: no JVD, caotid bruits, or masses Cardiac: irregular; no murmurs, rubs, or gallops,no edema  Respiratory:  clear to auscultation bilaterally,  normal work of breathing GI: soft, nontender, nondistended, + BS MS: no deformity or atrophy  Skin: warm and dry, device site well healed Neuro:  Strength and sensation are intact Psych: euthymic mood, full affect  EKG:  EKG is ordered today. Personal review of the ekg ordered shows sinus rhythm, ventricular bigeminy, rate 85  Personal review of the device interrogation today. Results in [place]: 05/05/2019: ALT 9; TSH 1.251 05/07/2019: Magnesium 1.9 05/08/2019: BUN 64; Creatinine, Ser 2.78; Hemoglobin 8.3; Platelets 180; Potassium 4.5; Sodium 138    Lipid Panel     Component Value Date/Time   CHOL 112 06/13/2018 0954   TRIG 51 06/13/2018 0954   HDL 40 06/13/2018 0954   CHOLHDL 2.8 06/13/2018 0954   CHOLHDL 4.1 08/16/2015 1555   VLDL 23 08/16/2015 1555   LDLCALC 62 06/13/2018 0954     Wt Readings from Last 3 Encounters:  07/22/19 165 lb (74.8 kg)  05/20/19 162 lb (73.5 kg)  05/08/19 167 lb 1.7 oz (75.8 kg)      Other studies Reviewed: Additional studies/ records that were reviewed today include: TTE 2014  Review of the above records today demonstrates:  - Left ventricle: The cavity size was normal. There was severe concentric and mild asymmetric hypertrophy. Systolic function was normal. Wall motion was normal; there were no regional wall motion abnormalities. Doppler parameters are consistent with abnormal left ventricular relaxation (grade 1 diastolic dysfunction). - Aortic root: The aortic root was mildly dilated. - Right atrium: The atrium was mildly dilated. - Pulmonary arteries: Systolic pressure was mildly increased. PA peak pressure: 61mm Hg (S).  ASSESSMENT AND PLAN:  1.  2:1 AV block: This post Medtronic dual-chamber pacemaker.  Device functioning appropriately.  No changes.  2. Hypertension: Currently well controlled  3.  PVCs: Is in ventricular bigeminy today.  I Amyri Frenz see him back in 6 months with an ECG.  If he remains in  bigeminy, we Yehoshua Vitelli arrange for a cardiac monitor.  Current medicines are reviewed at length with the patient today.   The patient does not have concerns regarding his medicines.  The following changes were made today: None  Labs/ tests ordered today include:  Orders Placed This Encounter  Procedures  . EKG 12-Lead     Disposition:   FU with Eryc Bodey 6 months  Signed, Lantz Hermann Meredith Leeds, MD  07/22/2019 4:33 PM     [place] [address] [phone] (office) [phone] (fax)

## 2019-07-23 DIAGNOSIS — I69391 Dysphagia following cerebral infarction: Secondary | ICD-10-CM | POA: Diagnosis not present

## 2019-07-23 DIAGNOSIS — I69354 Hemiplegia and hemiparesis following cerebral infarction affecting left non-dominant side: Secondary | ICD-10-CM | POA: Diagnosis not present

## 2019-07-23 DIAGNOSIS — K219 Gastro-esophageal reflux disease without esophagitis: Secondary | ICD-10-CM | POA: Diagnosis not present

## 2019-07-23 DIAGNOSIS — M1990 Unspecified osteoarthritis, unspecified site: Secondary | ICD-10-CM | POA: Diagnosis not present

## 2019-07-23 DIAGNOSIS — I441 Atrioventricular block, second degree: Secondary | ICD-10-CM | POA: Diagnosis not present

## 2019-07-23 DIAGNOSIS — I129 Hypertensive chronic kidney disease with stage 1 through stage 4 chronic kidney disease, or unspecified chronic kidney disease: Secondary | ICD-10-CM | POA: Diagnosis not present

## 2019-07-23 DIAGNOSIS — G1220 Motor neuron disease, unspecified: Secondary | ICD-10-CM | POA: Diagnosis not present

## 2019-07-23 DIAGNOSIS — N184 Chronic kidney disease, stage 4 (severe): Secondary | ICD-10-CM | POA: Diagnosis not present

## 2019-07-23 DIAGNOSIS — D631 Anemia in chronic kidney disease: Secondary | ICD-10-CM | POA: Diagnosis not present

## 2019-07-23 DIAGNOSIS — L89152 Pressure ulcer of sacral region, stage 2: Secondary | ICD-10-CM | POA: Diagnosis not present

## 2019-07-24 DIAGNOSIS — I69391 Dysphagia following cerebral infarction: Secondary | ICD-10-CM | POA: Diagnosis not present

## 2019-07-24 DIAGNOSIS — K219 Gastro-esophageal reflux disease without esophagitis: Secondary | ICD-10-CM | POA: Diagnosis not present

## 2019-07-24 DIAGNOSIS — M1990 Unspecified osteoarthritis, unspecified site: Secondary | ICD-10-CM | POA: Diagnosis not present

## 2019-07-24 DIAGNOSIS — G1220 Motor neuron disease, unspecified: Secondary | ICD-10-CM | POA: Diagnosis not present

## 2019-07-24 DIAGNOSIS — L89152 Pressure ulcer of sacral region, stage 2: Secondary | ICD-10-CM | POA: Diagnosis not present

## 2019-07-24 DIAGNOSIS — I441 Atrioventricular block, second degree: Secondary | ICD-10-CM | POA: Diagnosis not present

## 2019-07-24 DIAGNOSIS — I69354 Hemiplegia and hemiparesis following cerebral infarction affecting left non-dominant side: Secondary | ICD-10-CM | POA: Diagnosis not present

## 2019-07-24 DIAGNOSIS — D631 Anemia in chronic kidney disease: Secondary | ICD-10-CM | POA: Diagnosis not present

## 2019-07-24 DIAGNOSIS — N184 Chronic kidney disease, stage 4 (severe): Secondary | ICD-10-CM | POA: Diagnosis not present

## 2019-07-24 DIAGNOSIS — I129 Hypertensive chronic kidney disease with stage 1 through stage 4 chronic kidney disease, or unspecified chronic kidney disease: Secondary | ICD-10-CM | POA: Diagnosis not present

## 2019-07-27 DIAGNOSIS — I69342 Monoplegia of lower limb following cerebral infarction affecting left dominant side: Secondary | ICD-10-CM | POA: Diagnosis not present

## 2019-07-27 DIAGNOSIS — I69391 Dysphagia following cerebral infarction: Secondary | ICD-10-CM | POA: Diagnosis not present

## 2019-07-28 DIAGNOSIS — K219 Gastro-esophageal reflux disease without esophagitis: Secondary | ICD-10-CM | POA: Diagnosis not present

## 2019-07-28 DIAGNOSIS — I69354 Hemiplegia and hemiparesis following cerebral infarction affecting left non-dominant side: Secondary | ICD-10-CM | POA: Diagnosis not present

## 2019-07-28 DIAGNOSIS — D631 Anemia in chronic kidney disease: Secondary | ICD-10-CM | POA: Diagnosis not present

## 2019-07-28 DIAGNOSIS — M1990 Unspecified osteoarthritis, unspecified site: Secondary | ICD-10-CM | POA: Diagnosis not present

## 2019-07-28 DIAGNOSIS — N184 Chronic kidney disease, stage 4 (severe): Secondary | ICD-10-CM | POA: Diagnosis not present

## 2019-07-28 DIAGNOSIS — L89152 Pressure ulcer of sacral region, stage 2: Secondary | ICD-10-CM | POA: Diagnosis not present

## 2019-07-28 DIAGNOSIS — I441 Atrioventricular block, second degree: Secondary | ICD-10-CM | POA: Diagnosis not present

## 2019-07-28 DIAGNOSIS — I69391 Dysphagia following cerebral infarction: Secondary | ICD-10-CM | POA: Diagnosis not present

## 2019-07-28 DIAGNOSIS — G1220 Motor neuron disease, unspecified: Secondary | ICD-10-CM | POA: Diagnosis not present

## 2019-07-28 DIAGNOSIS — I129 Hypertensive chronic kidney disease with stage 1 through stage 4 chronic kidney disease, or unspecified chronic kidney disease: Secondary | ICD-10-CM | POA: Diagnosis not present

## 2019-07-30 DIAGNOSIS — L89152 Pressure ulcer of sacral region, stage 2: Secondary | ICD-10-CM | POA: Diagnosis not present

## 2019-07-30 DIAGNOSIS — I69391 Dysphagia following cerebral infarction: Secondary | ICD-10-CM | POA: Diagnosis not present

## 2019-07-30 DIAGNOSIS — I69354 Hemiplegia and hemiparesis following cerebral infarction affecting left non-dominant side: Secondary | ICD-10-CM | POA: Diagnosis not present

## 2019-07-30 DIAGNOSIS — M1990 Unspecified osteoarthritis, unspecified site: Secondary | ICD-10-CM | POA: Diagnosis not present

## 2019-07-30 DIAGNOSIS — G1220 Motor neuron disease, unspecified: Secondary | ICD-10-CM | POA: Diagnosis not present

## 2019-07-30 DIAGNOSIS — K219 Gastro-esophageal reflux disease without esophagitis: Secondary | ICD-10-CM | POA: Diagnosis not present

## 2019-07-30 DIAGNOSIS — D631 Anemia in chronic kidney disease: Secondary | ICD-10-CM | POA: Diagnosis not present

## 2019-07-30 DIAGNOSIS — I441 Atrioventricular block, second degree: Secondary | ICD-10-CM | POA: Diagnosis not present

## 2019-07-30 DIAGNOSIS — I129 Hypertensive chronic kidney disease with stage 1 through stage 4 chronic kidney disease, or unspecified chronic kidney disease: Secondary | ICD-10-CM | POA: Diagnosis not present

## 2019-07-30 DIAGNOSIS — N184 Chronic kidney disease, stage 4 (severe): Secondary | ICD-10-CM | POA: Diagnosis not present

## 2019-07-31 ENCOUNTER — Other Ambulatory Visit: Payer: Medicare HMO | Admitting: Internal Medicine

## 2019-07-31 ENCOUNTER — Other Ambulatory Visit: Payer: Self-pay

## 2019-08-04 DIAGNOSIS — D631 Anemia in chronic kidney disease: Secondary | ICD-10-CM | POA: Diagnosis not present

## 2019-08-04 DIAGNOSIS — I441 Atrioventricular block, second degree: Secondary | ICD-10-CM | POA: Diagnosis not present

## 2019-08-04 DIAGNOSIS — M1990 Unspecified osteoarthritis, unspecified site: Secondary | ICD-10-CM | POA: Diagnosis not present

## 2019-08-04 DIAGNOSIS — G1220 Motor neuron disease, unspecified: Secondary | ICD-10-CM | POA: Diagnosis not present

## 2019-08-04 DIAGNOSIS — K219 Gastro-esophageal reflux disease without esophagitis: Secondary | ICD-10-CM | POA: Diagnosis not present

## 2019-08-04 DIAGNOSIS — L89152 Pressure ulcer of sacral region, stage 2: Secondary | ICD-10-CM | POA: Diagnosis not present

## 2019-08-04 DIAGNOSIS — I69391 Dysphagia following cerebral infarction: Secondary | ICD-10-CM | POA: Diagnosis not present

## 2019-08-04 DIAGNOSIS — I129 Hypertensive chronic kidney disease with stage 1 through stage 4 chronic kidney disease, or unspecified chronic kidney disease: Secondary | ICD-10-CM | POA: Diagnosis not present

## 2019-08-04 DIAGNOSIS — I69354 Hemiplegia and hemiparesis following cerebral infarction affecting left non-dominant side: Secondary | ICD-10-CM | POA: Diagnosis not present

## 2019-08-04 DIAGNOSIS — N184 Chronic kidney disease, stage 4 (severe): Secondary | ICD-10-CM | POA: Diagnosis not present

## 2019-08-06 DIAGNOSIS — I69391 Dysphagia following cerebral infarction: Secondary | ICD-10-CM | POA: Diagnosis not present

## 2019-08-06 DIAGNOSIS — I69354 Hemiplegia and hemiparesis following cerebral infarction affecting left non-dominant side: Secondary | ICD-10-CM | POA: Diagnosis not present

## 2019-08-06 DIAGNOSIS — K219 Gastro-esophageal reflux disease without esophagitis: Secondary | ICD-10-CM | POA: Diagnosis not present

## 2019-08-06 DIAGNOSIS — L89152 Pressure ulcer of sacral region, stage 2: Secondary | ICD-10-CM | POA: Diagnosis not present

## 2019-08-06 DIAGNOSIS — I441 Atrioventricular block, second degree: Secondary | ICD-10-CM | POA: Diagnosis not present

## 2019-08-06 DIAGNOSIS — G1220 Motor neuron disease, unspecified: Secondary | ICD-10-CM | POA: Diagnosis not present

## 2019-08-06 DIAGNOSIS — M1990 Unspecified osteoarthritis, unspecified site: Secondary | ICD-10-CM | POA: Diagnosis not present

## 2019-08-06 DIAGNOSIS — N184 Chronic kidney disease, stage 4 (severe): Secondary | ICD-10-CM | POA: Diagnosis not present

## 2019-08-06 DIAGNOSIS — I129 Hypertensive chronic kidney disease with stage 1 through stage 4 chronic kidney disease, or unspecified chronic kidney disease: Secondary | ICD-10-CM | POA: Diagnosis not present

## 2019-08-06 DIAGNOSIS — D631 Anemia in chronic kidney disease: Secondary | ICD-10-CM | POA: Diagnosis not present

## 2019-08-08 DIAGNOSIS — G1220 Motor neuron disease, unspecified: Secondary | ICD-10-CM | POA: Diagnosis not present

## 2019-08-08 DIAGNOSIS — I129 Hypertensive chronic kidney disease with stage 1 through stage 4 chronic kidney disease, or unspecified chronic kidney disease: Secondary | ICD-10-CM | POA: Diagnosis not present

## 2019-08-08 DIAGNOSIS — K219 Gastro-esophageal reflux disease without esophagitis: Secondary | ICD-10-CM | POA: Diagnosis not present

## 2019-08-08 DIAGNOSIS — I441 Atrioventricular block, second degree: Secondary | ICD-10-CM | POA: Diagnosis not present

## 2019-08-08 DIAGNOSIS — N184 Chronic kidney disease, stage 4 (severe): Secondary | ICD-10-CM | POA: Diagnosis not present

## 2019-08-08 DIAGNOSIS — M1990 Unspecified osteoarthritis, unspecified site: Secondary | ICD-10-CM | POA: Diagnosis not present

## 2019-08-08 DIAGNOSIS — L89152 Pressure ulcer of sacral region, stage 2: Secondary | ICD-10-CM | POA: Diagnosis not present

## 2019-08-08 DIAGNOSIS — I69354 Hemiplegia and hemiparesis following cerebral infarction affecting left non-dominant side: Secondary | ICD-10-CM | POA: Diagnosis not present

## 2019-08-08 DIAGNOSIS — I69391 Dysphagia following cerebral infarction: Secondary | ICD-10-CM | POA: Diagnosis not present

## 2019-08-08 DIAGNOSIS — D631 Anemia in chronic kidney disease: Secondary | ICD-10-CM | POA: Diagnosis not present

## 2019-08-11 DIAGNOSIS — G1220 Motor neuron disease, unspecified: Secondary | ICD-10-CM | POA: Diagnosis not present

## 2019-08-11 DIAGNOSIS — D631 Anemia in chronic kidney disease: Secondary | ICD-10-CM | POA: Diagnosis not present

## 2019-08-11 DIAGNOSIS — I129 Hypertensive chronic kidney disease with stage 1 through stage 4 chronic kidney disease, or unspecified chronic kidney disease: Secondary | ICD-10-CM | POA: Diagnosis not present

## 2019-08-11 DIAGNOSIS — I69354 Hemiplegia and hemiparesis following cerebral infarction affecting left non-dominant side: Secondary | ICD-10-CM | POA: Diagnosis not present

## 2019-08-11 DIAGNOSIS — M1990 Unspecified osteoarthritis, unspecified site: Secondary | ICD-10-CM | POA: Diagnosis not present

## 2019-08-11 DIAGNOSIS — I69391 Dysphagia following cerebral infarction: Secondary | ICD-10-CM | POA: Diagnosis not present

## 2019-08-11 DIAGNOSIS — K219 Gastro-esophageal reflux disease without esophagitis: Secondary | ICD-10-CM | POA: Diagnosis not present

## 2019-08-11 DIAGNOSIS — L89152 Pressure ulcer of sacral region, stage 2: Secondary | ICD-10-CM | POA: Diagnosis not present

## 2019-08-11 DIAGNOSIS — N184 Chronic kidney disease, stage 4 (severe): Secondary | ICD-10-CM | POA: Diagnosis not present

## 2019-08-11 DIAGNOSIS — I441 Atrioventricular block, second degree: Secondary | ICD-10-CM | POA: Diagnosis not present

## 2019-08-12 DIAGNOSIS — I69391 Dysphagia following cerebral infarction: Secondary | ICD-10-CM | POA: Diagnosis not present

## 2019-08-12 DIAGNOSIS — M1990 Unspecified osteoarthritis, unspecified site: Secondary | ICD-10-CM | POA: Diagnosis not present

## 2019-08-12 DIAGNOSIS — I69354 Hemiplegia and hemiparesis following cerebral infarction affecting left non-dominant side: Secondary | ICD-10-CM | POA: Diagnosis not present

## 2019-08-12 DIAGNOSIS — G1220 Motor neuron disease, unspecified: Secondary | ICD-10-CM | POA: Diagnosis not present

## 2019-08-12 DIAGNOSIS — L89152 Pressure ulcer of sacral region, stage 2: Secondary | ICD-10-CM | POA: Diagnosis not present

## 2019-08-12 DIAGNOSIS — K219 Gastro-esophageal reflux disease without esophagitis: Secondary | ICD-10-CM | POA: Diagnosis not present

## 2019-08-12 DIAGNOSIS — I441 Atrioventricular block, second degree: Secondary | ICD-10-CM | POA: Diagnosis not present

## 2019-08-12 DIAGNOSIS — I129 Hypertensive chronic kidney disease with stage 1 through stage 4 chronic kidney disease, or unspecified chronic kidney disease: Secondary | ICD-10-CM | POA: Diagnosis not present

## 2019-08-12 DIAGNOSIS — N184 Chronic kidney disease, stage 4 (severe): Secondary | ICD-10-CM | POA: Diagnosis not present

## 2019-08-12 DIAGNOSIS — D631 Anemia in chronic kidney disease: Secondary | ICD-10-CM | POA: Diagnosis not present

## 2019-08-15 DIAGNOSIS — N184 Chronic kidney disease, stage 4 (severe): Secondary | ICD-10-CM | POA: Diagnosis not present

## 2019-08-15 DIAGNOSIS — I441 Atrioventricular block, second degree: Secondary | ICD-10-CM | POA: Diagnosis not present

## 2019-08-15 DIAGNOSIS — I129 Hypertensive chronic kidney disease with stage 1 through stage 4 chronic kidney disease, or unspecified chronic kidney disease: Secondary | ICD-10-CM | POA: Diagnosis not present

## 2019-08-15 DIAGNOSIS — D631 Anemia in chronic kidney disease: Secondary | ICD-10-CM | POA: Diagnosis not present

## 2019-08-15 DIAGNOSIS — I69391 Dysphagia following cerebral infarction: Secondary | ICD-10-CM | POA: Diagnosis not present

## 2019-08-15 DIAGNOSIS — I69354 Hemiplegia and hemiparesis following cerebral infarction affecting left non-dominant side: Secondary | ICD-10-CM | POA: Diagnosis not present

## 2019-08-15 DIAGNOSIS — K219 Gastro-esophageal reflux disease without esophagitis: Secondary | ICD-10-CM | POA: Diagnosis not present

## 2019-08-15 DIAGNOSIS — M1990 Unspecified osteoarthritis, unspecified site: Secondary | ICD-10-CM | POA: Diagnosis not present

## 2019-08-15 DIAGNOSIS — L89152 Pressure ulcer of sacral region, stage 2: Secondary | ICD-10-CM | POA: Diagnosis not present

## 2019-08-15 DIAGNOSIS — G1220 Motor neuron disease, unspecified: Secondary | ICD-10-CM | POA: Diagnosis not present

## 2019-08-19 DIAGNOSIS — I441 Atrioventricular block, second degree: Secondary | ICD-10-CM | POA: Diagnosis not present

## 2019-08-19 DIAGNOSIS — N184 Chronic kidney disease, stage 4 (severe): Secondary | ICD-10-CM | POA: Diagnosis not present

## 2019-08-19 DIAGNOSIS — I129 Hypertensive chronic kidney disease with stage 1 through stage 4 chronic kidney disease, or unspecified chronic kidney disease: Secondary | ICD-10-CM | POA: Diagnosis not present

## 2019-08-19 DIAGNOSIS — D631 Anemia in chronic kidney disease: Secondary | ICD-10-CM | POA: Diagnosis not present

## 2019-08-19 DIAGNOSIS — G1220 Motor neuron disease, unspecified: Secondary | ICD-10-CM | POA: Diagnosis not present

## 2019-08-19 DIAGNOSIS — I69391 Dysphagia following cerebral infarction: Secondary | ICD-10-CM | POA: Diagnosis not present

## 2019-08-19 DIAGNOSIS — K219 Gastro-esophageal reflux disease without esophagitis: Secondary | ICD-10-CM | POA: Diagnosis not present

## 2019-08-19 DIAGNOSIS — L89152 Pressure ulcer of sacral region, stage 2: Secondary | ICD-10-CM | POA: Diagnosis not present

## 2019-08-19 DIAGNOSIS — M1990 Unspecified osteoarthritis, unspecified site: Secondary | ICD-10-CM | POA: Diagnosis not present

## 2019-08-19 DIAGNOSIS — I69354 Hemiplegia and hemiparesis following cerebral infarction affecting left non-dominant side: Secondary | ICD-10-CM | POA: Diagnosis not present

## 2019-08-20 ENCOUNTER — Other Ambulatory Visit: Payer: Self-pay

## 2019-08-20 ENCOUNTER — Ambulatory Visit: Payer: Medicare HMO | Attending: Family Medicine | Admitting: Family Medicine

## 2019-08-20 ENCOUNTER — Telehealth: Payer: Self-pay

## 2019-08-20 ENCOUNTER — Encounter: Payer: Self-pay | Admitting: Family Medicine

## 2019-08-20 VITALS — BP 117/67 | HR 40 | Temp 98.0°F | Ht 74.0 in

## 2019-08-20 DIAGNOSIS — K921 Melena: Secondary | ICD-10-CM | POA: Diagnosis not present

## 2019-08-20 DIAGNOSIS — Z23 Encounter for immunization: Secondary | ICD-10-CM | POA: Diagnosis not present

## 2019-08-20 DIAGNOSIS — R001 Bradycardia, unspecified: Secondary | ICD-10-CM | POA: Diagnosis not present

## 2019-08-20 DIAGNOSIS — N184 Chronic kidney disease, stage 4 (severe): Secondary | ICD-10-CM

## 2019-08-20 DIAGNOSIS — R7303 Prediabetes: Secondary | ICD-10-CM | POA: Diagnosis not present

## 2019-08-20 DIAGNOSIS — G1220 Motor neuron disease, unspecified: Secondary | ICD-10-CM | POA: Diagnosis not present

## 2019-08-20 DIAGNOSIS — I69354 Hemiplegia and hemiparesis following cerebral infarction affecting left non-dominant side: Secondary | ICD-10-CM | POA: Diagnosis not present

## 2019-08-20 DIAGNOSIS — Z950 Presence of cardiac pacemaker: Secondary | ICD-10-CM | POA: Diagnosis not present

## 2019-08-20 LAB — POCT GLYCOSYLATED HEMOGLOBIN (HGB A1C): HbA1c, POC (controlled diabetic range): 5.1 % (ref 0.0–7.0)

## 2019-08-20 NOTE — Telephone Encounter (Signed)
Met with the patient and his wife when they were in the clinic today.  She was requesting assistance with personal care for patient.  Explained that aide services with [facility] are very limited and only available while patient is receiving home SN or PT and she said that home health is about to stop.  Another option would be PCS but he does not have medicaid. Provided her with a medicaid application and eligibility criteria as well as the contact # for DSS in [place].  Also provided her with the # for [facility] as he may be eligible for additional assistance with copays and medications.  Reminded her that open enrollment for medicare plans ends 09/01/2019

## 2019-08-20 NOTE — Progress Notes (Signed)
Established Patient Office Visit  Subjective:  Patient ID: Darrell [name], male    DOB: 09-Sep-1951  Age: 68 y.o. MRN: [id number]  CC:  Chief Complaint  Patient presents with  . Hypertension    HPI Darrell [name] is a 68 year old patient with a history of hypertension, previous CVA with residual left-sided weakness, seizure in the setting of prior CVA, second degree AV block (status post medtronic dual chamber pacemaker) stage III chronic kidney disease, and primary lateral sclerosis who is seen today with a complaint of black stools for the past 2-3 weeks.  He is accompanied by his wife who is his caregiver.  No change in bowel habits, constipation at baseline that is treated with lactulose.  No frank blood.  Denies recent falls, dizziness.  Of note the patient was prescribed ferrous sulfate in 04/2019 but it was just noted that his stool was black recently.    His hypertension is controlled but the patients wife states that he is not taking metoprolol. She brought his medication in to the office and metoprolol is not among his daily medications.  He is bradycardic in the office today and it was discussed with the patient's wife that they should notify cardiology of his heart rate and the medication discrepancy.  She is concerned about an additional visit today so she will be calling [facility] today.  CKD stage III followed by nephrology with next appointment in 09/2018.  Neurology manages his motor neuron disease and he has a follow up visit scheduled.  The patient has been receiving in home physical therapy for two months, but does not have home care.  Medicare will not cover this for an extended period so the case worker was consulted and she provided the patient's wife with the application for Medicaid which would cover these services.  Past Medical History:  Diagnosis Date  . Arthritis   . At high risk for falls 08/16/2015  . Bradycardia   . Chronic kidney disease    stage 3 GFR 30-59  ml/min   . CIDP (chronic inflammatory demyelinating polyneuropathy) ([place])   . CKD (chronic kidney disease) stage 3, GFR 30-59 ml/min 08/16/2015  . Dysphagia as late effect of cerebrovascular disease    pts wife states pt has to eat soft foods   . Elevated liver enzymes 08/10/2016  . GERD (gastroesophageal reflux disease)   . Glaucoma   . High cholesterol   . History of CVA with residual deficit 03/25/2013  . Hypertension   . Hypertensive retinopathy of both eyes 01/16/2017  . Inguinal hernia 03/25/2013  . Liver hemangioma 08/14/2016  . Neuromuscular disorder ([place])    chronic inflammatory demyelinating polyneuropathy   . New onset seizure ([place]) 07/08/2017   seizure 07/14/18  . Nuclear sclerosis of both eyes 10/25/2016  . Presence of permanent cardiac pacemaker    placed in april 2018  . Primary open angle glaucoma of both eyes, indeterminate stage 10/25/2016  . Renal mass, right 08/14/2016  . Status cardiac pacemaker 01/29/2017   Placed for second degree heart block on 01/16/17 Medtronic Azure XT DR MRI SureScan dual-chamber pacemaker  . Stroke [facility])    2011 with residual deficit left sided weakness  . Tobacco dependence     Past Surgical History:  Procedure Laterality Date  . EYE SURGERY    . HERNIA REPAIR    . INGUINAL HERNIA REPAIR Right 11/23/2014   Procedure: right inguinal hernia repair with mesh;  Surgeon: Armandina Gemma, MD;  Location: [facility];  Service: General;  Laterality: Right;  . INSERTION OF MESH N/A 11/23/2014   Procedure: INSERTION OF MESH;  Surgeon: Armandina Gemma, MD;  [facility];  Service: General;  Laterality: N/A;  . MASS EXCISION Left 08/29/2017   Procedure: EXCISION OF LEFT NECK MASS;  Surgeon: Coralie Keens, MD;  Location: [facility];  Service: General;  Laterality: Left;  . PACEMAKER IMPLANT N/A 01/16/2017   Procedure: Pacemaker Implant;  Surgeon: Will Meredith Leeds, MD;  Location: [facility];  Service: Cardiovascular;  Laterality: N/A;  . SHOULDER SURGERY  Bilateral 1988, 1998    Family History  Problem Relation Age of Onset  . Hypertension Mother   . Diabetes Sister   . Hypertension Sister   . Cancer Sister        1 sister  . COPD Sister        in 1 sister  . Stomach cancer Neg Hx   . Colon cancer Neg Hx   . Pancreatic cancer Neg Hx   . Esophageal cancer Neg Hx     Social History   Socioeconomic History  . Marital status: Married    Spouse name: [name]  . Number of children: 1  . Years of education: college  . Highest education level: Not on file  Occupational History  . Occupation: retired  Scientific laboratory technician  . Financial resource strain: Not on file  . Food insecurity    Worry: Not on file    Inability: Not on file  . Transportation needs    Medical: Not on file    Non-medical: Not on file  Tobacco Use  . Smoking status: Current Every Day Smoker    Packs/day: 1.00    Years: 40.00    Pack years: 40.00    Types: Cigarettes  . Smokeless tobacco: Never Used  . Tobacco comment: 09/13/17 little < 1 PPD  Substance and Sexual Activity  . Alcohol use: No    Comment: alcohol free for 1 year was drinking 1/2 pint per day   . Drug use: No  . Sexual activity: Not on file  Lifestyle  . Physical activity    Days per week: Not on file    Minutes per session: Not on file  . Stress: Not on file  Relationships  . Social Herbalist on phone: Not on file    Gets together: Not on file    Attends religious service: Not on file    Active member of club or organization: Not on file    Attends meetings of clubs or organizations: Not on file    Relationship status: Not on file  . Intimate partner violence    Fear of current or ex partner: Not on file    Emotionally abused: Not on file    Physically abused: Not on file    Forced sexual activity: Not on file  Other Topics Concern  . Not on file  Social History Narrative   09/13/17 Patient lives at home with spouse.   Has 62 yo daughter   No grandchildren     Outpatient Medications Prior to Visit  Medication Sig Dispense Refill  . aspirin EC 81 MG tablet Take 1 tablet (81 mg total) by mouth daily. 90 tablet 3  . atorvastatin (LIPITOR) 40 MG tablet Take 1 tablet (40 mg total) by mouth daily. 90 tablet 1  . brimonidine (ALPHAGAN P) 0.1 % SOLN Place 1 drop into both eyes daily.     Marland Kitchen  dorzolamide-timolol (COSOPT) 22.3-6.8 MG/ML ophthalmic solution Place 1 drop into both eyes daily.     . ferrous sulfate 325 (65 FE) MG tablet Take 1 tablet (325 mg total) by mouth 2 (two) times daily with a meal. 60 tablet 3  . lactulose (CHRONULAC) 10 GM/15ML solution Take 15 mLs (10 g total) by mouth daily. 946 mL 1  . latanoprost (XALATAN) 0.005 % ophthalmic solution Place 1 drop into both eyes at bedtime.    . levETIRAcetam (KEPPRA) 500 MG tablet Take 1 tablet (500 mg total) by mouth 2 (two) times daily. 180 tablet 4  . metoprolol tartrate (LOPRESSOR) 50 MG tablet Take 50 mg by mouth 2 (two) times daily.    . Multiple Vitamins-Iron (DAILY VITAMIN FORMULA+IRON) TABS Take 1 tablet by mouth daily.    . polyethylene glycol (MIRALAX / GLYCOLAX) packet Take 17 g by mouth daily as needed for mild constipation. 30 each 3  . tamsulosin (FLOMAX) 0.4 MG CAPS capsule Take 1 capsule by mouth once daily 90 capsule 0   No facility-administered medications prior to visit.     Allergies  Allergen Reactions  . Ace Inhibitors Other (See Comments)    Hyperkalemia  . Doxycycline Other (See Comments)    Hiccups, cough, nausea and emesis, elevated liver enzymes, elevated eosinophils, SOB concerning for early DRESS syndrome   . Atacand Hct [Candesartan Cilexetil-Hctz] Hives  . Shellfish Allergy Hives    ROS Review of Systems  Constitutional: Negative for fatigue, fever and unexpected weight change.  HENT: Negative for congestion, rhinorrhea, sinus pressure and sinus pain.   Eyes: Negative for pain and redness.  Respiratory: Negative for cough, chest tightness and shortness of  breath.   Cardiovascular: Negative for chest pain, palpitations and leg swelling.  Gastrointestinal: Positive for constipation. Negative for abdominal distention, abdominal pain, diarrhea, nausea and vomiting.       Black stool  Endocrine: Negative for polydipsia and polyuria.  Genitourinary: Negative for decreased urine volume, difficulty urinating and dysuria.  Musculoskeletal: Negative for arthralgias and myalgias.  Skin: Negative for color change and rash.  Neurological: Positive for speech difficulty and weakness. Negative for dizziness, tremors, seizures and numbness.       Left sided weakness  Hematological: Does not bruise/bleed easily.  Psychiatric/Behavioral: Negative for agitation and behavioral problems.      Objective:    Physical Exam  Constitutional: He is oriented to person, place, and time. He appears cachectic. No distress.  HENT:  Head: Normocephalic and atraumatic.  Eyes: Pupils are equal, round, and reactive to light. Conjunctivae and EOM are normal.  Neck: Normal range of motion. Neck supple.  Cardiovascular: Regular rhythm, normal heart sounds and intact distal pulses.  No murmur heard. Pulmonary/Chest: Effort normal and breath sounds normal. No respiratory distress. He has no wheezes.  Abdominal: Soft. Bowel sounds are normal. He exhibits no distension. There is no abdominal tenderness.  Musculoskeletal:     Comments: Spasticity of extremities  Neurological: He is alert and oriented to person, place, and time.  RUE and RLE strength 5/5 LUE and LLE strength 3/5  Skin: Skin is warm and dry. No rash noted.  Psychiatric: He has a normal mood and affect. His behavior is normal.    BP 117/67   Pulse (!) 40   Temp 98 F (36.7 C) (Oral)   Ht 6\' 2"  (1.88 m)   SpO2 98%   BMI 21.18 kg/m  Wt Readings from Last 3 Encounters:  07/22/19 165 lb (74.8 kg)  05/20/19 162 lb (73.5 kg)  05/08/19 167 lb 1.7 oz (75.8 kg)     Health Maintenance Due  Topic Date Due   . COLONOSCOPY  02/19/2002  . INFLUENZA VACCINE  04/26/2019    There are no preventive care reminders to display for this patient.  Lab Results  Component Value Date   TSH 1.251 05/05/2019   Lab Results  Component Value Date   WBC 8.6 05/08/2019   HGB 8.3 (L) 05/08/2019   HCT 27.2 (L) 05/08/2019   MCV 95.8 05/08/2019   PLT 180 05/08/2019   Lab Results  Component Value Date   NA 138 05/08/2019   K 4.5 05/08/2019   CO2 21 (L) 05/08/2019   GLUCOSE 91 05/08/2019   BUN 64 (H) 05/08/2019   CREATININE 2.78 (H) 05/08/2019   BILITOT 0.5 05/05/2019   ALKPHOS 78 05/05/2019   AST 14 (L) 05/05/2019   ALT 9 05/05/2019   PROT 7.8 05/05/2019   ALBUMIN 2.7 (L) 05/07/2019   CALCIUM 9.2 05/08/2019   ANIONGAP 5 05/08/2019   Lab Results  Component Value Date   CHOL 112 06/13/2018   Lab Results  Component Value Date   HDL 40 06/13/2018   Lab Results  Component Value Date   LDLCALC 62 06/13/2018   Lab Results  Component Value Date   TRIG 51 06/13/2018   Lab Results  Component Value Date   CHOLHDL 2.8 06/13/2018   Lab Results  Component Value Date   HGBA1C 5.1 08/20/2019      Assessment & Plan:   1. Prediabetes A1c today is 5.1 - HgB A1c  2. 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[place]) Progressive with ongoing spasticity Followed by neurology Physical therapy is providing home visits  Consult with case manager - provided Medicaid application which would be required in order to receive home health visits  3. Black stool Possibly due to ferrous sulfate but rule out GI bleed - CBC with Differential/Platelet - Fecal occult blood, imunochemical([facility])  4. Hemiparesis affecting left side as late effect of stroke ([name]) Risk factor modification In home assistive devices High risk for falls - fall precautions  5. CKD (chronic kidney disease), stage IV ([place]) Follow[facility] by [facility]  6. Bradycardia Instructed to contact [facility] today and provided  their contact information Currently not taking metoprolol Medtronic dual chamber pacemaker in place Asymptomatic  7. Status cardiac pacemaker Due to second degree AV block Last interrogated 06/23/2019   No orders of the defined types were placed in this encounter.   Follow-up: Return in about 3 months (around 11/20/2019) for medical conditions_virtual.    Tomasita Morrow, RN

## 2019-08-20 NOTE — Progress Notes (Signed)
Patient has been having black stools for 2 weeks.

## 2019-08-21 ENCOUNTER — Encounter: Payer: Self-pay | Admitting: Family Medicine

## 2019-08-21 LAB — CBC WITH DIFFERENTIAL/PLATELET
Basophils Absolute: 0 10*3/uL (ref 0.0–0.2)
Basos: 1 %
EOS (ABSOLUTE): 0.3 10*3/uL (ref 0.0–0.4)
Eos: 5 %
Hematocrit: 33.2 % — ABNORMAL LOW (ref 37.5–51.0)
Hemoglobin: 11.1 g/dL — ABNORMAL LOW (ref 13.0–17.7)
Immature Grans (Abs): 0 10*3/uL (ref 0.0–0.1)
Immature Granulocytes: 0 %
Lymphocytes Absolute: 1.8 10*3/uL (ref 0.7–3.1)
Lymphs: 26 %
MCH: 29.7 pg (ref 26.6–33.0)
MCHC: 33.4 g/dL (ref 31.5–35.7)
MCV: 89 fL (ref 79–97)
Monocytes Absolute: 0.6 10*3/uL (ref 0.1–0.9)
Monocytes: 8 %
Neutrophils Absolute: 4.2 10*3/uL (ref 1.4–7.0)
Neutrophils: 60 %
Platelets: 298 10*3/uL (ref 150–450)
RBC: 3.74 x10E6/uL — ABNORMAL LOW (ref 4.14–5.80)
RDW: 12.7 % (ref 11.6–15.4)
WBC: 7 10*3/uL (ref 3.4–10.8)

## 2019-08-22 ENCOUNTER — Telehealth: Payer: Self-pay | Admitting: Cardiology

## 2019-08-22 NOTE — Telephone Encounter (Signed)
Darrell [name] wife called the answering service today to talk about the patient's pulse.  She says that she took him to his PCP who noted his pulse to be in the 40s and was very concerned and wanted him to follow-up with cardiology.  I looked back at Dr. Curt Bears' last note with heart rate noted at 43, however, EKG showed rate of 85 with bigeminy. The patient is not having any lightheadedness, dizziness, chest discomfort, syncope.  His wife also noted that the patient was ordered metoprolol however she says she never got it and has not been giving it to him. I advised that since the patient is not acting abnormally, would continue with current care and I will send this note to Dr. Curt Bears and his nurse Timmie Foerster to see on Monday.  We discussed signs and symptoms of when to have the patient seen urgently in the ER.  She verbalizes understanding. She will all the office on Monday if she is still concerned.

## 2019-08-26 ENCOUNTER — Ambulatory Visit: Payer: Medicare HMO | Admitting: Family Medicine

## 2019-08-26 ENCOUNTER — Other Ambulatory Visit: Payer: Self-pay | Admitting: Cardiology

## 2019-08-26 DIAGNOSIS — I69391 Dysphagia following cerebral infarction: Secondary | ICD-10-CM | POA: Diagnosis not present

## 2019-08-26 DIAGNOSIS — I69342 Monoplegia of lower limb following cerebral infarction affecting left dominant side: Secondary | ICD-10-CM | POA: Diagnosis not present

## 2019-08-26 MED ORDER — METOPROLOL TARTRATE 50 MG PO TABS: 50.0000 mg | ORAL_TABLET | Freq: Two times a day (BID) | ORAL | 3 refills | Status: DC

## 2019-08-26 NOTE — Telephone Encounter (Signed)
Pt's medication was sent to pt's pharmacy as requested. Confirmation received.  °

## 2019-09-02 ENCOUNTER — Other Ambulatory Visit: Payer: Medicare HMO | Admitting: Internal Medicine

## 2019-09-02 ENCOUNTER — Other Ambulatory Visit: Payer: Self-pay

## 2019-09-22 LAB — CUP PACEART REMOTE DEVICE CHECK
Battery Remaining Longevity: 116 mo
Battery Voltage: 3 V
Brady Statistic AP VP Percent: 17.89 %
Brady Statistic AP VS Percent: 41.42 %
Brady Statistic AS VP Percent: 0.75 %
Brady Statistic AS VS Percent: 39.93 %
Brady Statistic RA Percent Paced: 68.41 %
Brady Statistic RV Percent Paced: 18.64 %
Date Time Interrogation Session: 20201228011112
Implantable Lead Implant Date: 20180424
Implantable Lead Implant Date: 20180424
Implantable Lead Location: 753859
Implantable Lead Location: 753860
Implantable Lead Model: 5076
Implantable Lead Model: 5076
Implantable Pulse Generator Implant Date: 20180424
Lead Channel Impedance Value: 285 Ohm
Lead Channel Impedance Value: 380 Ohm
Lead Channel Impedance Value: 380 Ohm
Lead Channel Impedance Value: 437 Ohm
Lead Channel Pacing Threshold Amplitude: 0.5 V
Lead Channel Pacing Threshold Amplitude: 0.75 V
Lead Channel Pacing Threshold Pulse Width: 0.4 ms
Lead Channel Pacing Threshold Pulse Width: 0.4 ms
Lead Channel Sensing Intrinsic Amplitude: 10 mV
Lead Channel Sensing Intrinsic Amplitude: 10 mV
Lead Channel Sensing Intrinsic Amplitude: 2.5 mV
Lead Channel Sensing Intrinsic Amplitude: 2.5 mV
Lead Channel Setting Pacing Amplitude: 1.5 V
Lead Channel Setting Pacing Amplitude: 2.5 V
Lead Channel Setting Pacing Pulse Width: 0.4 ms
Lead Channel Setting Sensing Sensitivity: 2 mV

## 2019-09-23 ENCOUNTER — Ambulatory Visit (INDEPENDENT_AMBULATORY_CARE_PROVIDER_SITE_OTHER): Payer: Medicare HMO | Admitting: *Deleted

## 2019-09-23 DIAGNOSIS — R001 Bradycardia, unspecified: Secondary | ICD-10-CM | POA: Diagnosis not present

## 2019-09-26 DIAGNOSIS — I69391 Dysphagia following cerebral infarction: Secondary | ICD-10-CM | POA: Diagnosis not present

## 2019-09-26 DIAGNOSIS — I69342 Monoplegia of lower limb following cerebral infarction affecting left dominant side: Secondary | ICD-10-CM | POA: Diagnosis not present

## 2019-10-03 ENCOUNTER — Ambulatory Visit: Payer: Medicare HMO | Admitting: Podiatry

## 2019-10-03 LAB — FECAL OCCULT BLOOD, IMMUNOCHEMICAL

## 2019-10-06 DIAGNOSIS — N2889 Other specified disorders of kidney and ureter: Secondary | ICD-10-CM | POA: Diagnosis not present

## 2019-10-06 DIAGNOSIS — N183 Chronic kidney disease, stage 3 unspecified: Secondary | ICD-10-CM | POA: Diagnosis not present

## 2019-10-06 DIAGNOSIS — I129 Hypertensive chronic kidney disease with stage 1 through stage 4 chronic kidney disease, or unspecified chronic kidney disease: Secondary | ICD-10-CM | POA: Diagnosis not present

## 2019-10-08 ENCOUNTER — Other Ambulatory Visit: Payer: Self-pay | Admitting: Family Medicine

## 2019-10-08 DIAGNOSIS — I69354 Hemiplegia and hemiparesis following cerebral infarction affecting left non-dominant side: Secondary | ICD-10-CM

## 2019-10-20 ENCOUNTER — Telehealth: Payer: Self-pay | Admitting: Family Medicine

## 2019-10-20 NOTE — Telephone Encounter (Signed)
Patient wife calling in regards to results. Please contact patient.

## 2019-10-20 NOTE — Telephone Encounter (Signed)
Patient wife was called and informed that specimen was too old, wife was informed to obtain a new kit.

## 2019-10-21 ENCOUNTER — Telehealth: Payer: Self-pay | Admitting: Family Medicine

## 2019-10-21 NOTE — Telephone Encounter (Signed)
Spouse is concern about bad cough and tightness in chest and wants a call to see what can be done at home

## 2019-10-22 NOTE — Telephone Encounter (Signed)
That is too far off.  If we are unable to secure an appointment with any available clinician he would need to have an urgent care visit.

## 2019-10-22 NOTE — Telephone Encounter (Signed)
Patient was set up a televisit with Zelda on Friday at 230

## 2019-10-22 NOTE — Telephone Encounter (Signed)
I made patient an appointment for first available which is 11/06/2019.

## 2019-10-24 ENCOUNTER — Encounter: Payer: Self-pay | Admitting: Nurse Practitioner

## 2019-10-24 ENCOUNTER — Ambulatory Visit: Payer: Medicare HMO | Attending: Nurse Practitioner | Admitting: Nurse Practitioner

## 2019-10-24 ENCOUNTER — Other Ambulatory Visit: Payer: Self-pay

## 2019-10-24 DIAGNOSIS — R05 Cough: Secondary | ICD-10-CM | POA: Diagnosis not present

## 2019-10-24 DIAGNOSIS — R059 Cough, unspecified: Secondary | ICD-10-CM

## 2019-10-24 MED ORDER — GUAIFENESIN-DM 100-10 MG/5ML PO SYRP: 5.0000 mL | ORAL_SOLUTION | ORAL | 0 refills | Status: DC | PRN

## 2019-10-24 MED ORDER — FERROUS SULFATE 325 (65 FE) MG PO TABS: 325.0000 mg | ORAL_TABLET | Freq: Two times a day (BID) | ORAL | 3 refills | Status: DC

## 2019-10-24 NOTE — Progress Notes (Signed)
Virtual Visit via Telephone Note Due to national recommendations of social distancing due to Darrell [name], telehealth visit is felt to be most appropriate for this patient at this time.  I discussed the limitations, risks, security and privacy concerns of performing an evaluation and management service by telephone and the availability of in person appointments. I also discussed with the patient that there may be a patient responsible charge related to this service. The patient expressed understanding and agreed to proceed.    I connected with Darrell [name] on 10/24/19  at   2:30 PM EST  EDT by telephone and verified that I am speaking with the correct person using two identifiers.   Consent I discussed the limitations, risks, security and privacy concerns of performing an evaluation and management service by telephone and the availability of in person appointments. I also discussed with the patient that there may be a patient responsible charge related to this service. The patient expressed understanding and agreed to proceed.   Location of Patient: Private Residence    Location of Provider: [facility] [place] participating in Telemedicine visit: [name] FNP-BC [place] CMA Darrell [name]  Darrell [name]   History of Present Illness: Telemedicine visit for: Cough His wife is speaking for him today. States she wanted to let Darrell [name] know about his current symptoms. He is not in any acute distress.   He has symptoms of  nonproductive cough.  Symptoms began 2 weeks ago.  The cough is non-productive, without wheezing, dyspnea or hemoptysis and is aggravated by nothing. There are NO Associated symptoms. They do not have new pets. Patient does not have a history of asthma. Patient does not have a history of environmental allergens. Patient has not recent travel. Patient does have a history of smoking. Patient does not have a recent previous Chest X-ray. Wife states he  has been rubbing on the side of his chest where his pacemaker is however she she asks if he is having chest pain he tells her no. Still smoking "a few" cigarettes per day.  Cough is more noticeable at night.  He has not had a recent COVID test.   Past Medical History:  Diagnosis Date  . Arthritis   . At high risk for falls 08/16/2015  . Bradycardia   . Chronic kidney disease    stage 3 GFR 30-59 ml/min   . CIDP (chronic inflammatory demyelinating polyneuropathy) ([place])   . CKD (chronic kidney disease) stage 3, GFR 30-59 ml/min 08/16/2015  . Dysphagia as late effect of cerebrovascular disease    pts wife states pt has to eat soft foods   . Elevated liver enzymes 08/10/2016  . GERD (gastroesophageal reflux disease)   . Glaucoma   . High cholesterol   . History of CVA with residual deficit 03/25/2013  . Hypertension   . Hypertensive retinopathy of both eyes 01/16/2017  . Inguinal hernia 03/25/2013  . Liver hemangioma 08/14/2016  . Neuromuscular disorder ([place])    chronic inflammatory demyelinating polyneuropathy   . New onset seizure ([place]) 07/08/2017   seizure 10/20/[name]  . Nuclear sclerosis of both eyes 10/25/2016  . Presence of permanent cardiac pacemaker    placed in april 2018  . Primary open angle glaucoma of both eyes, indeterminate stage 10/25/2016  . Renal mass, right 08/14/2016  . Status cardiac pacemaker 01/29/2017   Placed for second degree heart block on 01/16/17 Medtronic Azure XT DR MRI SureScan dual-chamber pacemaker  .  Stroke [facility])    2011 with residual deficit left sided weakness  . Tobacco dependence     Past Surgical History:  Procedure Laterality Date  . EYE SURGERY    . HERNIA REPAIR    . INGUINAL HERNIA REPAIR Right 11/23/2014   Procedure: right inguinal hernia repair with mesh;  Surgeon: Armandina Gemma, MD;  [facility];  Service: General;  Laterality: Right;  . INSERTION OF MESH N/A 11/23/2014   Procedure: INSERTION OF MESH;  Surgeon: Armandina Gemma, MD;  [facility];  Service: General;  Laterality: N/A;  . MASS EXCISION Left 08/29/2017   Procedure: EXCISION OF LEFT NECK MASS;  Surgeon: Coralie Keens, MD;  Location: [facility];  Service: General;  Laterality: Left;  . PACEMAKER IMPLANT N/A 01/16/2017   Procedure: Pacemaker Implant;  Surgeon: Will Meredith Leeds, MD;  Location: [place] [facility];  Service: Cardiovascular;  Laterality: N/A;  . SHOULDER SURGERY Bilateral 1988, 1998    Family History  Problem Relation Age of Onset  . Hypertension Mother   . Diabetes Sister   . Hypertension Sister   . Cancer Sister        1 sister  . COPD Sister        in 1 sister  . Stomach cancer Neg Hx   . Colon cancer Neg Hx   . Pancreatic cancer Neg Hx   . Esophageal cancer Neg Hx     Social History   Socioeconomic History  . Marital status: Married    Spouse name: [name]  . Number of children: 1  . Years of education: college  . Highest education level: Not on file  Occupational History  . Occupation: retired  Tobacco Use  . Smoking status: Current Every Day Smoker    Packs/day: 1.00    Years: 40.00    Pack years: 40.00    Types: Cigarettes  . Smokeless tobacco: Never Used  . Tobacco comment: 09/13/17 little < 1 PPD  Substance and Sexual Activity  . Alcohol use: No    Comment: alcohol free for 1 year was drinking 1/2 pint per day   . Drug use: No  . Sexual activity: Not on file  Other Topics Concern  . Not on file  Social History Narrative   09/13/17 Patient lives at home with spouse.   Has 72 yo daughter   No grandchildren   Social Determinants of Radio broadcast assistant Strain:   . Difficulty of Paying Living Expenses: Not on file  Food Insecurity:   . Worried About Charity fundraiser in the Last Year: Not on file  . Ran Out of Food in the Last Year: Not on file  Transportation Needs:   . Lack of Transportation (Medical): Not on file  . Lack of Transportation (Non-Medical): Not on file  Physical Activity:   . Days of  Exercise per Week: Not on file  . Minutes of Exercise per Session: Not on file  Stress:   . Feeling of Stress : Not on file  Social Connections:   . Frequency of Communication with Friends and Family: Not on file  . Frequency of Social Gatherings with Friends and Family: Not on file  . Attends Religious Services: Not on file  . Active Member of Clubs or Organizations: Not on file  . Attends Archivist Meetings: Not on file  . Marital Status: Not on file     Observations/Objective: Awake, alert and oriented x 3  Review of Systems  Constitutional: Negative for fever, malaise/fatigue and weight loss.  HENT: Negative.  Negative for nosebleeds.   Eyes: Negative.  Negative for blurred vision, double vision and photophobia.  Respiratory: Positive for cough. Negative for shortness of breath and wheezing.   Cardiovascular: Negative.  Negative for chest pain, palpitations and leg swelling.  Gastrointestinal: Negative.  Negative for heartburn, nausea and vomiting.  Musculoskeletal: Negative.  Negative for myalgias.  Neurological: Negative.  Negative for dizziness, focal weakness, seizures and headaches.  Psychiatric/Behavioral: Negative.  Negative for suicidal ideas.    Assessment and Plan: [place] was seen today for cough.  Diagnoses and all orders for this visit:  Cough in adult patient -     ferrous sulfate 325 (65 FE) MG tablet; Take 1 tablet (325 mg total) by mouth 2 (two) times daily with a meal. -     guaiFENesin-dextromethorphan (ROBITUSSIN DM) 100-10 MG/5ML syrup; Take 5 mLs by mouth every 4 (four) hours as needed for cough.     Follow Up Instructions Return if symptoms worsen or fail to improve.     I discussed the assessment and treatment plan with the patient. The patient was provided an opportunity to ask questions and all were answered. The patient agreed with the plan and demonstrated an understanding of the instructions.   The patient was advised to call  back or seek an in-person evaluation if the symptoms worsen or if the condition fails to improve as anticipated.  I provided 14 minutes of non-face-to-face time during this encounter including median intraservice time, reviewing previous notes, labs, imaging, medications and explaining diagnosis and management.  Gildardo Pounds, FNP-BC

## 2019-10-27 DIAGNOSIS — I69391 Dysphagia following cerebral infarction: Secondary | ICD-10-CM | POA: Diagnosis not present

## 2019-10-27 DIAGNOSIS — I69342 Monoplegia of lower limb following cerebral infarction affecting left dominant side: Secondary | ICD-10-CM | POA: Diagnosis not present

## 2019-11-06 ENCOUNTER — Encounter: Payer: Self-pay | Admitting: Family Medicine

## 2019-11-06 ENCOUNTER — Ambulatory Visit: Payer: Medicare HMO | Attending: Family Medicine | Admitting: Family Medicine

## 2019-11-06 DIAGNOSIS — R05 Cough: Secondary | ICD-10-CM | POA: Diagnosis not present

## 2019-11-06 DIAGNOSIS — J3489 Other specified disorders of nose and nasal sinuses: Secondary | ICD-10-CM | POA: Diagnosis not present

## 2019-11-06 DIAGNOSIS — R059 Cough, unspecified: Secondary | ICD-10-CM

## 2019-11-06 DIAGNOSIS — R197 Diarrhea, unspecified: Secondary | ICD-10-CM

## 2019-11-06 MED ORDER — FLUTICASONE PROPIONATE 50 MCG/ACT NA SUSP: 2.0000 | Freq: Every day | NASAL | 3 refills | Status: DC

## 2019-11-06 MED ORDER — GUAIFENESIN-DM 100-10 MG/5ML PO SYRP: 5.0000 mL | ORAL_SOLUTION | Freq: Four times a day (QID) | ORAL | 0 refills | Status: DC | PRN

## 2019-11-06 MED ORDER — CETIRIZINE HCL 10 MG PO TABS: 10.0000 mg | ORAL_TABLET | Freq: Every day | ORAL | 3 refills | Status: DC

## 2019-11-06 MED ORDER — MISC. DEVICES MISC: 0 refills | Status: DC

## 2019-11-06 NOTE — Progress Notes (Signed)
Patient has been called and DOB has been verified. Patient has been screened and transferred to PCP to start phone visit.    Patient still has a slight cough, patient is also having loose bowels.

## 2019-11-06 NOTE — Progress Notes (Signed)
Virtual Visit via Telephone Note  I connected with Darrell [name], on 11/06/2019 at 10:55 AM by telephone due to the COVID-19 pandemic and verified that I am speaking with the correct person using two identifiers.   Consent: I discussed the limitations, risks, security and privacy concerns of performing an evaluation and management service by telephone and the availability of in person appointments. I also discussed with the patient that there may be a patient responsible charge related to this service. The patient expressed understanding and agreed to proceed.   Location of Patient: Home  Location of Provider: Clinic   Persons participating in Telemedicine visit: Darrell [name] Dr. Margarita Rana     History of Present Illness: Darrell [name] is a 69 year old male with a history of hypertension, previous CVA with residual left-sided weakness, seizure in the setting of prior CVA, second degree AV block (status post medtronic dual chamber pacemaker) stage III CKD, motor neuron disease versus ALS who is seen today for an acute visit.   Seen by the Nurse practitioner 2 weeks ago. His cough is better but wife states it sounds like a lot of loose congestion is his chest Denies dyspnea, wheezing  but spouse has noticed him rubbing the site of his pacemaker but he denies presence of chest, fever. He has no sinus pressure but he has been sneezing and sometimes has a runny nose.  He currently has no home care Spouse also states he has had one to two loose BM/day for the last 2-3 weeks with no recent change in diet and after a couple of days he will have a formed BM. The color is Gracey  He denies abdominal pain; currently on ferrous sulfate. Previously had black stools which resolved when he ran out of  ferrous sulfate. Of note he is on Miralax and Lactulose.  Past Medical History:  Diagnosis Date  . Arthritis   . At high risk for falls 08/16/2015  . Bradycardia   . Chronic  kidney disease    stage 3 GFR 30-59 ml/min   . CIDP (chronic inflammatory demyelinating polyneuropathy) ([place])   . CKD (chronic kidney disease) stage 3, GFR 30-59 ml/min 08/16/2015  . Dysphagia as late effect of cerebrovascular disease    pts wife states pt has to eat soft foods   . Elevated liver enzymes 08/10/2016  . GERD (gastroesophageal reflux disease)   . Glaucoma   . High cholesterol   . History of CVA with residual deficit 03/25/2013  . Hypertension   . Hypertensive retinopathy of both eyes 01/16/2017  . Inguinal hernia 03/25/2013  . Liver hemangioma 08/14/2016  . Neuromuscular disorder ([place])    chronic inflammatory demyelinating polyneuropathy   . New onset seizure ([place]) 07/08/2017   seizure 07/14/18  . Nuclear sclerosis of both eyes 10/25/2016  . Presence of permanent cardiac pacemaker    placed in april 2018  . Primary open angle glaucoma of both eyes, indeterminate stage 10/25/2016  . Renal mass, right 08/14/2016  . Status cardiac pacemaker 01/29/2017   Placed for second degree heart block on 01/16/17 Medtronic Azure XT DR MRI SureScan dual-chamber pacemaker  . Stroke [facility])    2011 with residual deficit left sided weakness  . Tobacco dependence    Allergies  Allergen Reactions  . Ace Inhibitors Other (See Comments)    Hyperkalemia  . Doxycycline Other (See Comments)    Hiccups, cough, nausea and emesis, elevated liver enzymes, elevated eosinophils, SOB concerning for early DRESS syndrome   .  Atacand Hct [Candesartan Cilexetil-Hctz] Hives  . Shellfish Allergy Hives    Current Outpatient Medications on File Prior to Visit  Medication Sig Dispense Refill  . aspirin EC 81 MG tablet Take 1 tablet (81 mg total) by mouth daily. 90 tablet 3  . atorvastatin (LIPITOR) 40 MG tablet Take 1 tablet by mouth once daily 90 tablet 0  . brimonidine (ALPHAGAN P) 0.1 % SOLN Place 1 drop into both eyes daily.     . dorzolamide-timolol (COSOPT) 22.3-6.8 MG/ML ophthalmic solution Place 1 drop  into both eyes daily.     . ferrous sulfate 325 (65 FE) MG tablet Take 1 tablet (325 mg total) by mouth 2 (two) times daily with a meal. 60 tablet 3  . lactulose (CHRONULAC) 10 GM/15ML solution Take 15 mLs (10 g total) by mouth daily. 946 mL 1  . latanoprost (XALATAN) 0.005 % ophthalmic solution Place 1 drop into both eyes at bedtime.    . levETIRAcetam (KEPPRA) 500 MG tablet Take 1 tablet (500 mg total) by mouth 2 (two) times daily. 180 tablet 4  . metoprolol tartrate (LOPRESSOR) 50 MG tablet Take 1 tablet (50 mg total) by mouth 2 (two) times daily. 180 tablet 3  . Multiple Vitamins-Iron (DAILY VITAMIN FORMULA+IRON) TABS Take 1 tablet by mouth daily.    . polyethylene glycol (MIRALAX / GLYCOLAX) packet Take 17 g by mouth daily as needed for mild constipation. 30 each 3  . tamsulosin (FLOMAX) 0.4 MG CAPS capsule Take 1 capsule by mouth once daily 90 capsule 0  . guaiFENesin-dextromethorphan (ROBITUSSIN DM) 100-10 MG/5ML syrup Take 5 mLs by mouth every 4 (four) hours as needed for cough. (Patient not taking: Reported on 11/06/2019) 236 mL 0   No current facility-administered medications on file prior to visit.    Observations/Objective: Awake, alert, oriented x3 Not in acute distress  Assessment and Plan: 1. Diarrhea, unspecified type Likely laxative induced Hold off on Miralax and Lactulose  2. Cough in adult patient Improved Could be a sinus component to this Might also benefit from using a suction - guaiFENesin-dextromethorphann (ROBITUSSIN DM) 100-10 MG/5ML syrup; Take 5 mLs by mouth every 6 (six) hours as needed for cough.  Dispense: 236 mL; Refill: 0  3. Sinus drainage Placed on antihistamine and flonase - cetirizine (ZYRTEC) 10 MG tablet; Take 1 tablet (10 mg total) by mouth daily.  Dispense: 30 tablet; Refill: 3 - fluticasone (FLONASE) 50 MCG/ACT nasal spray; Place 2 sprays into both nostrils daily.  Dispense: 16 g; Refill: 3   Follow Up Instructions: Keep previously  scheduled appointment   I discussed the assessment and treatment plan with the patient. The patient was provided an opportunity to ask questions and all were answered. The patient agreed with the plan and demonstrated an understanding of the instructions.   The patient was advised to call back or seek an in-person evaluation if the symptoms worsen or if the condition fails to improve as anticipated.     I provided 20 minutes total of non-face-to-face time during this encounter including median intraservice time, reviewing previous notes, investigations, ordering medications, medical decision making, coordinating care and patient verbalized understanding at the end of the visit.     [name], MD, FAAFP. [facility] and [place], [place]   11/06/2019, 10:55 AM

## 2019-11-11 ENCOUNTER — Ambulatory Visit: Payer: Medicare HMO | Admitting: Podiatry

## 2019-11-11 ENCOUNTER — Other Ambulatory Visit: Payer: Self-pay | Admitting: Family Medicine

## 2019-11-11 MED ORDER — MISC. DEVICES MISC: 0 refills | Status: DC

## 2019-11-18 ENCOUNTER — Telehealth: Payer: Self-pay | Admitting: *Deleted

## 2019-11-18 ENCOUNTER — Ambulatory Visit: Payer: Medicare HMO | Admitting: Diagnostic Neuroimaging

## 2019-11-18 NOTE — Telephone Encounter (Signed)
Patient called at 8:15 am, stated he "couldn't come in and wanted to just reschedule". His follow up today was at 11:30 am. He did reschedule for March apt.

## 2019-11-24 DIAGNOSIS — I69391 Dysphagia following cerebral infarction: Secondary | ICD-10-CM | POA: Diagnosis not present

## 2019-11-24 DIAGNOSIS — I69342 Monoplegia of lower limb following cerebral infarction affecting left dominant side: Secondary | ICD-10-CM | POA: Diagnosis not present

## 2019-11-25 ENCOUNTER — Encounter: Payer: Self-pay | Admitting: Family Medicine

## 2019-11-25 ENCOUNTER — Other Ambulatory Visit: Payer: Self-pay

## 2019-11-25 ENCOUNTER — Ambulatory Visit: Payer: Medicare HMO | Attending: Family Medicine | Admitting: Family Medicine

## 2019-11-25 DIAGNOSIS — R059 Cough, unspecified: Secondary | ICD-10-CM

## 2019-11-25 DIAGNOSIS — N184 Chronic kidney disease, stage 4 (severe): Secondary | ICD-10-CM

## 2019-11-25 DIAGNOSIS — R05 Cough: Secondary | ICD-10-CM | POA: Diagnosis not present

## 2019-11-25 DIAGNOSIS — G1220 Motor neuron disease, unspecified: Secondary | ICD-10-CM | POA: Diagnosis not present

## 2019-11-25 DIAGNOSIS — K921 Melena: Secondary | ICD-10-CM

## 2019-11-25 DIAGNOSIS — I69354 Hemiplegia and hemiparesis following cerebral infarction affecting left non-dominant side: Secondary | ICD-10-CM

## 2019-11-25 MED ORDER — FERROUS SULFATE 325 (65 FE) MG PO TABS: 325.0000 mg | ORAL_TABLET | Freq: Two times a day (BID) | ORAL | 3 refills | Status: DC

## 2019-11-25 MED ORDER — TAMSULOSIN HCL 0.4 MG PO CAPS: 0.4000 mg | ORAL_CAPSULE | Freq: Every day | ORAL | 1 refills | Status: DC

## 2019-11-25 MED ORDER — ATORVASTATIN CALCIUM 40 MG PO TABS: 40.0000 mg | ORAL_TABLET | Freq: Every day | ORAL | 1 refills | Status: DC

## 2019-11-25 NOTE — Progress Notes (Signed)
Patient has been called and DOB has been verified. Patient has been screened and transferred to PCP to start phone visit.     

## 2019-11-25 NOTE — Progress Notes (Signed)
Virtual Visit via Telephone Note  I connected with Darrell [name], on 11/25/2019 at 3:59 PM by telephone due to the COVID-19 pandemic and verified that I am speaking with the correct person using two identifiers.   Consent: I discussed the limitations, risks, security and privacy concerns of performing an evaluation and management service by telephone and the availability of in person appointments. I also discussed with the patient that there may be a patient responsible charge related to this service. The patient expressed understanding and agreed to proceed.   Location of Patient: Home  Location of Provider: Clinic   Persons participating in Telemedicine visit: Billyjack Trompeter [place]-CMA Dr. Margarita Rana     History of Present Illness: Darrell [name] is a 69 year old male with a history of hypertension, previous CVA with residual left-sided weakness, seizure in the setting of prior CVA, second degree AV block (status post medtronic dual chamber pacemaker) stage III CKD, motor neuron disease versus ALS who is seen today for chronic disease management   Since he got back on iron tabs his stool has been dark. He is yet to complete his stool FIT test; colonoscopy not an option at this time due to the fact that risk outweigh benefit and his decreased life expectancy given ongoing medical conditions. His cough is much better but is still present. Spouse states he has not been speaking out or articulating clearly and she is having a hard time understanding and this aggravated him. He has no throat pain, dyspnea. Appetite is good. He had loose bowels at his last visit and miralax and lactulose were discontinued but he has not noticed any improvement in symptoms. He saw his Nephrologist in 09/2019 and has labs done; does not have appointment with Neurology at [facility] and has been advised to call to schedule I.  Past Medical History:  Diagnosis Date  . Arthritis   . At  high risk for falls 08/16/2015  . Bradycardia   . Chronic kidney disease    stage 3 GFR 30-59 ml/min   . CIDP (chronic inflammatory demyelinating polyneuropathy) ([place])   . CKD (chronic kidney disease) stage 3, GFR 30-59 ml/min 08/16/2015  . Dysphagia as late effect of cerebrovascular disease    pts wife states pt has to eat soft foods   . Elevated liver enzymes 08/10/2016  . GERD (gastroesophageal reflux disease)   . Glaucoma   . High cholesterol   . History of CVA with residual deficit 03/25/2013  . Hypertension   . Hypertensive retinopathy of both eyes 01/16/2017  . Inguinal hernia 03/25/2013  . Liver hemangioma 08/14/2016  . Neuromuscular disorder ([place])    chronic inflammatory demyelinating polyneuropathy   . New onset seizure ([place]) 07/08/2017   seizure 07/14/18  . Nuclear sclerosis of both eyes 10/25/2016  . Presence of permanent cardiac pacemaker    placed in april 2018  . Primary open angle glaucoma of both eyes, indeterminate stage 10/25/2016  . Renal mass, right 08/14/2016  . Status cardiac pacemaker 01/29/2017   Placed for second degree heart block on 01/16/17 Medtronic Azure XT DR MRI SureScan dual-chamber pacemaker  . Stroke [facility])    2011 with residual deficit left sided weakness  . Tobacco dependence    Allergies  Allergen Reactions  . Ace Inhibitors Other (See Comments)    Hyperkalemia  . Doxycycline Other (See Comments)    Hiccups, cough, nausea and emesis, elevated liver enzymes, elevated eosinophils, SOB concerning for early DRESS syndrome   .  Atacand Hct [Candesartan Cilexetil-Hctz] Hives  . Shellfish Allergy Hives    Current Outpatient Medications on File Prior to Visit  Medication Sig Dispense Refill  . aspirin EC 81 MG tablet Take 1 tablet (81 mg total) by mouth daily. 90 tablet 3  . atorvastatin (LIPITOR) 40 MG tablet Take 1 tablet by mouth once daily 90 tablet 0  . brimonidine (ALPHAGAN P) 0.1 % SOLN Place 1 drop into both eyes daily.     . cetirizine  (ZYRTEC) 10 MG tablet Take 1 tablet (10 mg total) by mouth daily. 30 tablet 3  . dorzolamide-timolol (COSOPT) 22.3-6.8 MG/ML ophthalmic solution Place 1 drop into both eyes daily.     . ferrous sulfate 325 (65 FE) MG tablet Take 1 tablet (325 mg total) by mouth 2 (two) times daily with a meal. 60 tablet 3  . fluticasone (FLONASE) 50 MCG/ACT nasal spray Place 2 sprays into both nostrils daily. 16 g 3  . guaiFENesin-dextromethorphan (ROBITUSSIN DM) 100-10 MG/5ML syrup Take 5 mLs by mouth every 6 (six) hours as needed for cough. 236 mL 0  . lactulose (CHRONULAC) 10 GM/15ML solution Take 15 mLs (10 g total) by mouth daily. 946 mL 1  . latanoprost (XALATAN) 0.005 % ophthalmic solution Place 1 drop into both eyes at bedtime.    . levETIRAcetam (KEPPRA) 500 MG tablet Take 1 tablet (500 mg total) by mouth 2 (two) times daily. 180 tablet 4  . metoprolol tartrate (LOPRESSOR) 50 MG tablet Take 1 tablet (50 mg total) by mouth 2 (two) times daily. 180 tablet 3  . Misc. Devices MISC [place] suction. Dx: ALS 1 each 0  . Multiple Vitamins-Iron (DAILY VITAMIN FORMULA+IRON) TABS Take 1 tablet by mouth daily.    . polyethylene glycol (MIRALAX / GLYCOLAX) packet Take 17 g by mouth daily as needed for mild constipation. 30 each 3  . tamsulosin (FLOMAX) 0.4 MG CAPS capsule Take 1 capsule by mouth once daily 90 capsule 0   No current facility-administered medications on file prior to visit.    Observations/Objective: Alert, awake, oriented x3 Not in acute distress  Assessment and Plan: 1. Hemiparesis affecting left side as late effect of stroke ([name]) Risk factor modification Continue high-dose statin - atorvastatin (LIPITOR) 40 MG tablet; Take 1 tablet (40 mg total) by mouth daily.  Dispense: 90 tablet; Refill: 1  2. Cough in adult patient Improved He has not been able to cough out properly [place] suction ordered at his last visit Continue antitussive  3. CKD (chronic kidney disease), stage IV  ([name]) Followed by nephrology with recent labs performed Continue ferrous sulfate for anemia and chronic kidney disease - ferrous sulfate 325 (65 FE) MG tablet; Take 1 tablet (325 mg total) by mouth 2 (two) times daily with a meal.  Dispense: 60 tablet; Refill  4. 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[place]) Rapidly progressing He is wheelchair-bound Advised to schedule appointment with neurology at [facility]  5.  Black stool  Could be from ferrous sulfate  His wife will stop by the clinic to pick up the FIT testing kit so his dark stools can be evaluated given he is unable to undergo colonoscopy at this time due to the shortened life expectancy as result of debilitating medical conditions  Follow Up Instructions: Return in about 3 months (around 02/25/2020) for Chronic disease management.    I discussed the assessment and treatment plan with the patient. The patient was provided an opportunity to ask questions and all were answered.  The patient agreed with the plan and demonstrated an understanding of the instructions.   The patient was advised to call back or seek an in-person evaluation if the symptoms worsen or if the condition fails to improve as anticipated.     I provided 16 minutes total of non-face-to-face time during this encounter including median intraservice time, reviewing previous notes, investigations, ordering medications, medical decision making, coordinating care and patient verbalized understanding at the end of the visit.     [name], MD, FAAFP. [facility] and [employer], [place]   11/25/2019, 3:59 PM

## 2019-11-27 DIAGNOSIS — R197 Diarrhea, unspecified: Secondary | ICD-10-CM | POA: Diagnosis not present

## 2019-11-27 DIAGNOSIS — R05 Cough: Secondary | ICD-10-CM | POA: Diagnosis not present

## 2019-11-27 DIAGNOSIS — G1221 Amyotrophic lateral sclerosis: Secondary | ICD-10-CM | POA: Diagnosis not present

## 2019-11-27 DIAGNOSIS — J3489 Other specified disorders of nose and nasal sinuses: Secondary | ICD-10-CM | POA: Diagnosis not present

## 2019-12-02 ENCOUNTER — Other Ambulatory Visit: Payer: Self-pay

## 2019-12-02 DIAGNOSIS — K921 Melena: Secondary | ICD-10-CM

## 2019-12-15 ENCOUNTER — Ambulatory Visit: Payer: Medicare HMO | Admitting: Diagnostic Neuroimaging

## 2019-12-15 ENCOUNTER — Encounter: Payer: Self-pay | Admitting: Diagnostic Neuroimaging

## 2019-12-15 ENCOUNTER — Other Ambulatory Visit: Payer: Self-pay

## 2019-12-15 VITALS — BP 128/81 | HR 60 | Temp 97.1°F | Ht 74.0 in

## 2019-12-15 DIAGNOSIS — G1220 Motor neuron disease, unspecified: Secondary | ICD-10-CM | POA: Diagnosis not present

## 2019-12-15 DIAGNOSIS — R569 Unspecified convulsions: Secondary | ICD-10-CM

## 2019-12-15 MED ORDER — LEVETIRACETAM 500 MG PO TABS: 500.0000 mg | ORAL_TABLET | Freq: Two times a day (BID) | ORAL | 4 refills | Status: DC

## 2019-12-15 NOTE — Progress Notes (Signed)
[facility]  PATIENT: Darrell [name] DOB: 10-29-1950  REFERRING CLINICIAN: Vanita Panda HISTORY FROM: patient and wife REASON FOR VISIT: follow up   HISTORICAL  CHIEF COMPLAINT:  Chief Complaint  Patient presents with  . Seizures    rm 7, 6 month FU, wife- [name] "no seizure activity"    HISTORY OF PRESENT ILLNESS:   UPDATE (12/15/19, VRP): Since last visit, no seizures. Some progrssion of motor neuron dz.  Tolerating meds.  UPDATE (05/20/19, VRP): Since last visit, was in the hospital for choking episode, AKI, constipation. Now slightly better. No seizures. Muscle weakness progressing. Dysphagia / dysarthria worsening.    UPDATE (11/11/18, VRP): Since last visit, had seizure on 10/29/18; depakote was increased to 1250mg  twice a day. then could not afford medication, so we changed levetiracetam to 500mg  twice a day. Now tolerating meds, and more affordable.   UPDATE (07/24/18, VRP): Since last visit, has had 2nd opinion at [facility], and now likely has ALS or PLS diagnosis. Symptoms are progressive. Severity is severe. No alleviating or aggravating factors. Has follow up in [place] clinic.  Also had 2 more seizures on 07/14/18; VPA level at ER was undetectable. Given IV loading dose and sent home.   UPDATE (03/19/18, VRP): Since last visit, doing well. No seizures. No alleviating or aggravating factors. More muscle stiffness.   NEW HPI (09/13/17, VRP): 69 year old male with CIDP, here for seizure evaluation. Since last visit, patient declined IVIG therapy that I had ordered in 2015, and then patient lost to followup. Gait and balance continue to decline. 20lb weight loss over last year. Now with new onset seizure in Oct 2018 at home (witnessed by wife). Admitted to hospital and started on levetiracetam, then changed to divalproex 500mg  twice a day, and doing well. No further seizures. No alleviating or aggravating factors.   UPDATE 03/30/14: Since last visit, patient feels  strength is stable. Still with slurred speech, swallow diff, balance diff. Now he is interested in IVIG treatment. Completed PT sessions.  UPDATE 11/25/13: Since last visit patient had EMG and spinal tap. Findings are suspicious for CIDP. Patient's symptoms have gradually progressed. Continues to have more upper and lower extremity weakness, dysarthria, dysphasia and intermittent muscle twitching. Patient is frustrated today and does not want to take anymore medication. Patient's wife would like to find out more options for treatment.  UPDATE 10/03/13: Since last visit, continues to have left sided weakness, worsening speech and swallow difficulty. Is drinking half pint Etoh per 1-2 weeks. Cigarette use has increased to 2 packs per day.  PRIOR HPI (04/03/13): 69 year old right-handed male with hypertension, hypercholesterolemia, smoking, alcohol abuse, here for evaluation of gait and balance difficulty for past one year.  2011 patient had left arm and left leg weakness, diagnosed with stroke. Apparently he was admitted to hospital and treated. He had fairly good recovery initial one year after stroke. He required using a walking stick but otherwise was fairly mobile. Unfortunately he did not have good medical followup at that time. Patient was abusing alcohol significantly at that time, ranging from 1 pint up to a fifth of alcohol per day. In the past one to 2 years he has cut down on his drinking, now averaging 1 pint of alcohol per week.  In the past one year, patient's mobility is significantly deteriorated. He also has increasing slurred speech, trouble swallowing, balance and gait difficulty. He continues to fall down. Symptoms are worse when he drinks alcohol. Nowadays he is  unable to stand up and walk across a room without assistance. This is been the case for the past one year.  REVIEW OF SYSTEMS: Full 14 system review of systems performed and negative except: as per HPI.    ALLERGIES: Allergies   Allergen Reactions  . Ace Inhibitors Other (See Comments)    Hyperkalemia  . Doxycycline Other (See Comments)    Hiccups, cough, nausea and emesis, elevated liver enzymes, elevated eosinophils, SOB concerning for early DRESS syndrome   . Atacand Hct [Candesartan Cilexetil-Hctz] Hives  . Shellfish Allergy Hives    HOME MEDICATIONS: Outpatient Medications Prior to Visit  Medication Sig Dispense Refill  . aspirin EC 81 MG tablet Take 1 tablet (81 mg total) by mouth daily. 90 tablet 3  . atorvastatin (LIPITOR) 40 MG tablet Take 1 tablet (40 mg total) by mouth daily. 90 tablet 1  . brimonidine (ALPHAGAN P) 0.1 % SOLN Place 1 drop into both eyes daily.     . cetirizine (ZYRTEC) 10 MG tablet Take 1 tablet (10 mg total) by mouth daily. 30 tablet 3  . dorzolamide-timolol (COSOPT) 22.3-6.8 MG/ML ophthalmic solution Place 1 drop into both eyes daily.     . ferrous sulfate 325 (65 FE) MG tablet Take 1 tablet (325 mg total) by mouth 2 (two) times daily with a meal. 60 tablet 3  . fluticasone (FLONASE) 50 MCG/ACT nasal spray Place 2 sprays into both nostrils daily. 16 g 3  . guaiFENesin-dextromethorphan (ROBITUSSIN DM) 100-10 MG/5ML syrup Take 5 mLs by mouth every 6 (six) hours as needed for cough. 236 mL 0  . latanoprost (XALATAN) 0.005 % ophthalmic solution Place 1 drop into both eyes at bedtime.    . levETIRAcetam (KEPPRA) 500 MG tablet Take 1 tablet (500 mg total) by mouth 2 (two) times daily. 180 tablet 4  . metoprolol tartrate (LOPRESSOR) 50 MG tablet Take 1 tablet (50 mg total) by mouth 2 (two) times daily. 180 tablet 3  . Misc. Devices MISC Yankauer suction. Dx: ALS 1 each 0  . Multiple Vitamins-Iron (DAILY VITAMIN FORMULA+IRON) TABS Take 1 tablet by mouth daily.    . tamsulosin (FLOMAX) 0.4 MG CAPS capsule Take 1 capsule (0.4 mg total) by mouth daily. 90 capsule 1  . torsemide (DEMADEX) 20 MG tablet Take 20 mg by mouth daily.     No facility-administered medications prior to visit.     PAST MEDICAL HISTORY: Past Medical History:  Diagnosis Date  . Arthritis   . At high risk for falls 08/16/2015  . Bradycardia   . Chronic kidney disease    stage 3 GFR 30-59 ml/min   . CIDP (chronic inflammatory demyelinating polyneuropathy) ([place])   . CKD (chronic kidney disease) stage 3, GFR 30-59 ml/min 08/16/2015  . Dysphagia as late effect of cerebrovascular disease    pts wife states pt has to eat soft foods   . Elevated liver enzymes 08/10/2016  . GERD (gastroesophageal reflux disease)   . Glaucoma   . High cholesterol   . History of CVA with residual deficit 03/25/2013  . Hypertension   . Hypertensive retinopathy of both eyes 01/16/2017  . Inguinal hernia 03/25/2013  . Liver hemangioma 08/14/2016  . Neuromuscular disorder ([place])    chronic inflammatory demyelinating polyneuropathy   . New onset seizure ([place]) 07/08/2017   seizure 07/14/18  . Nuclear sclerosis of both eyes 10/25/2016  . Presence of permanent cardiac pacemaker    placed in april 2018  . Primary open angle glaucoma of both  eyes, indeterminate stage 10/25/2016  . Renal mass, right 08/14/2016  . Status cardiac pacemaker 01/29/2017   Placed for second degree heart block on 01/16/17 Medtronic Azure XT DR MRI SureScan dual-chamber pacemaker  . Stroke [facility])    2011 with residual deficit left sided weakness  . Tobacco dependence     PAST SURGICAL HISTORY: Past Surgical History:  Procedure Laterality Date  . EYE SURGERY    . HERNIA REPAIR    . INGUINAL HERNIA REPAIR Right 11/23/2014   Procedure: right inguinal hernia repair with mesh;  Surgeon: Armandina Gemma, MD;  Location: [facility];  Service: General;  Laterality: Right;  . INSERTION OF MESH N/A 11/23/2014   Procedure: INSERTION OF MESH;  Surgeon: Armandina Gemma, MD;  Location: [facility];  Service: General;  Laterality: N/A;  . MASS EXCISION Left 08/29/2017   Procedure: EXCISION OF LEFT NECK MASS;  Surgeon: Coralie Keens, MD;  Location: [facility];  Service: General;   Laterality: Left;  . PACEMAKER IMPLANT N/A 01/16/2017   Procedure: Pacemaker Implant;  Surgeon: Will Meredith Leeds, MD;  Location: [facility];  Service: Cardiovascular;  Laterality: N/A;  . SHOULDER SURGERY Bilateral 1988, 1998    FAMILY HISTORY: Family History  Problem Relation Age of Onset  . Hypertension Mother   . Diabetes Sister   . Hypertension Sister   . Cancer Sister        1 sister  . COPD Sister        in 1 sister  . Stomach cancer Neg Hx   . Colon cancer Neg Hx   . Pancreatic cancer Neg Hx   . Esophageal cancer Neg Hx     SOCIAL HISTORY:  Social History   Socioeconomic History  . Marital status: Married    Spouse name: [name]  . Number of children: 1  . Years of education: college  . Highest education level: Not on file  Occupational History  . Occupation: retired  Tobacco Use  . Smoking status: Current Every Day Smoker    Packs/day: 1.00    Years: 40.00    Pack years: 40.00    Types: Cigarettes  . Smokeless tobacco: Never Used  . Tobacco comment: 09/13/17 little < 1 PPD  Substance and Sexual Activity  . Alcohol use: No    Comment: alcohol free for 1 year was drinking 1/2 pint per day   . Drug use: No  . Sexual activity: Not on file  Other Topics Concern  . Not on file  Social History Narrative   09/13/17 Patient lives at home with spouse.   Has 58 yo daughter   No grandchildren   Social Determinants of Radio broadcast assistant Strain:   . Difficulty of Paying Living Expenses:   Food Insecurity:   . Worried About Charity fundraiser in the Last Year:   . Arboriculturist in the Last Year:   Transportation Needs:   . Film/video editor (Medical):   Marland Kitchen Lack of Transportation (Non-Medical):   Physical Activity:   . Days of Exercise per Week:   . Minutes of Exercise per Session:   Stress:   . Feeling of Stress :   Social Connections:   . Frequency of Communication with Friends and Family:   . Frequency of Social Gatherings  with Friends and Family:   . Attends Religious Services:   . Active Member of Clubs or Organizations:   . Attends Archivist Meetings:   .  Marital Status:   Intimate Partner Violence:   . Fear of Current or Ex-Partner:   . Emotionally Abused:   Marland Kitchen Physically Abused:   . Sexually Abused:      PHYSICAL EXAM  Vitals:   12/15/19 1554  BP: 128/81  Pulse: 60  Temp: (!) 97.1 F (36.2 C)  Height: 6\' 2"  (1.88 m)    Not recorded      Body mass index is 21.18 kg/m.  GENERAL EXAM: Patient is in no distress  CARDIOVASCULAR: Regular rate and rhythm, no murmurs, no carotid bruits  NEUROLOGIC: MENTAL STATUS: awake, alert, language fluent, comprehension intact, naming intact CRANIAL NERVE: pupils equal and reactive to light, visual fields full to confrontation; ABLE TO COUNT FINGERS, extraocular muscles intact, no nystagmus, facial sensation and strength symmetric, uvula midline, shoulder shrug symmetric, tongue midline. SEVERE DYSARTHIA. INTERMITTENT TONGUE FASCICULATIONS.  MOTOR: INCREASED TONE IN LUE > RUE; LLE > RLE. RUE 3-4, RLE 3-4. LUE 2-3. LLE 2-3  SENSORY: INTACT TO LE COORDINATION: LIMITED IN LEFT SIDE. REFLEXES: BUE 2; BLE 2; CLONUS AT ANKLES (L > R) GAIT/STATION: LEANS BACK IN WHEELCHAIR; CANNOT STAND INDEPENDENTLY    DIAGNOSTIC DATA (LABS, IMAGING, TESTING) - I reviewed patient records, labs, notes, testing and imaging myself where available.  Lab Results  Component Value Date   WBC 7.0 08/20/2019   HGB 11.1 (L) 08/20/2019   HCT 33.2 (L) 08/20/2019   MCV 89 08/20/2019   PLT 298 08/20/2019      Component Value Date/Time   NA 138 05/08/2019 0524   NA 143 06/13/2018 0954   K 4.5 05/08/2019 0524   CL 112 (H) 05/08/2019 0524   CO2 21 (L) 05/08/2019 0524   GLUCOSE 91 05/08/2019 0524   BUN 64 (H) 05/08/2019 0524   BUN 19 06/13/2018 0954   CREATININE 2.78 (H) 05/08/2019 0524   CREATININE 1.45 (H) 08/10/2016 1647   CALCIUM 9.2 05/08/2019 0524   PROT  7.8 05/05/2019 1929   PROT 6.6 06/13/2018 0954   ALBUMIN 2.7 (L) 05/07/2019 0529   ALBUMIN 4.1 06/13/2018 0954   AST 14 (L) 05/05/2019 1929   ALT 9 05/05/2019 1929   ALKPHOS 78 05/05/2019 1929   BILITOT 0.5 05/05/2019 1929   BILITOT 0.4 06/13/2018 0954   GFRNONAA 23 (L) 05/08/2019 0524   GFRNONAA 51 (L) 08/10/2016 1647   GFRAA 26 (L) 05/08/2019 0524   GFRAA 58 (L) 08/10/2016 1647   Lab Results  Component Value Date   CHOL 112 06/13/2018   HDL 40 06/13/2018   LDLCALC 62 06/13/2018   TRIG 51 06/13/2018   CHOLHDL 2.8 06/13/2018   Lab Results  Component Value Date   HGBA1C 5.1 08/20/2019   Lab Results  Component Value Date   VITAMINB12 400 05/05/2019   Lab Results  Component Value Date   TSH 1.251 05/05/2019    02/27/13 CT head - severe chronic small vessel ischemic disease, periventricular, subcortical, pontine and cerebellar.   04/22/13 MRI BRAIN 1. Scattered chronic lacunar ischemic infarctions in the bilateral thalami, right basal ganglia, right centrum semioval and left anterior parasagittal regions.  2. Moderate chronic small vessel ischemic disease. Few small, chronic cerebral microbleeds in the right parietal and and left frontal regions, also within spectrum of chronic small vessel ischemic disease. 3. No acute findings.  04/22/13 MRI CERVICAL SPINE 1. At C5-6: disc bulging and facet hypertrophy with mild spinal stenosis (AP diameter 58mm) and severe biforaminal foraminal stenosis; no cord signal changes. 2. Multilevel degenerative changes and  foraminal stenosis from C2-3 to C6-7 as above.   10/20/13 EMG/NCS 1. 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 Abnormal F-wave latencies (including  absent F-waves and slowing in the demyelinating range in some  nerves) raises possibility of concomitant polyradiculopathy.  Considerations include chronic immune/inflammatory neuropathies,  such as CIDP. 2. No evidence of myopathy at this  time.  10/31/13 CSF results: WBC 5-->0, RBC 25-->2, protein 63, glucose 62, ACE 5, cytology negati ve, culture negative  07/08/17 EEG - There is a posterior dominant rhythm of 7-8 Hz reactive to eye opening and closure.  No focal slowing is present.  No epileptiform discharges or seizures. - There is evidence of mild generalized slowing of brain activity consistent with probable post-ictal state +/- toxic, metabolic, or infectious encephalopathy.  The patient is not in non-convulsive status epilepticus.   07/09/17 MRI brain  - Truncated and motion degraded examination. Within that limitation, no acute ischemia or significant mass effect. Advanced chronic microvascular disease.    ASSESSMENT AND PLAN  69 y.o. year old male  has a past medical history of Arthritis, At high risk for falls (08/16/2015), Bradycardia, Chronic kidney disease, CIDP (chronic inflammatory demyelinating polyneuropathy) ([place]), CKD (chronic kidney disease) stage 3, GFR 30-59 ml/min (08/16/2015), Dysphagia as late effect of cerebrovascular disease, Elevated liver enzymes (08/10/2016), GERD (gastroesophageal reflux disease), Glaucoma, High cholesterol, History of CVA with residual deficit (03/25/2013), Hypertension, Hypertensive retinopathy of both eyes (01/16/2017), Inguinal hernia (03/25/2013), Liver hemangioma (08/14/2016), Neuromuscular disorder ([place]), New onset seizure ([place]) (07/08/2017), Nuclear sclerosis of both eyes (10/25/2016), Presence of permanent cardiac pacemaker, Primary open angle glaucoma of both eyes, indeterminate stage (10/25/2016), Renal mass, right (08/14/2016), Status cardiac pacemaker (01/29/2017), Stroke ([place]), and Tobacco dependence. here with progressive gait and balance or deterioration since 2013. Also with dysarthria, dysphagia, left sided weakness and hyperreflexia.   Now with new onset seizure in Oct 2018. Breakthrough sz in Oct 2019.    Dx1 gait and balance diff: motor neuron disease (ALS vs PLS) + etoh  neuropathy  Dx2: seizure disorder  No diagnosis found.   PLAN:  SEIZURE DISORDER (? related to CNS vascular disease) - continue levetiracetam 500mg  twice a day   MUSCLE WEAKNESS / NEUROPATHY / HYPERTONIA / DYSPHAGIA (ALS vs PLS; + sensory neuropathy from ETOH?) - follow up with ALS clinic (Dr. Tillman Abide)  CEREBROVASCULAR DISEASE - Risk factor mgmt per PCP for cerebrovascular disease - continue smoking and ETOH cessation  CERVICAL SPINAL STENOSIS - Conservative mgmt of cervical spinal stenosis (mild at C5-6)  Meds ordered this encounter  Medications  . levETIRAcetam (KEPPRA) 500 MG tablet    Sig: Take 1 tablet (500 mg total) by mouth 2 (two) times daily.    Dispense:  180 tablet    Refill:  4   Return in about 1 year (around 12/14/2020).    Penni Bombard, MD 2/44/6286, 3:81 PM Certified in Neurology, Neurophysiology and Neuroimaging  [facility] [address] [place], [place] [address] [phone]

## 2019-12-23 ENCOUNTER — Ambulatory Visit (INDEPENDENT_AMBULATORY_CARE_PROVIDER_SITE_OTHER): Payer: Medicare HMO | Admitting: *Deleted

## 2019-12-23 DIAGNOSIS — R001 Bradycardia, unspecified: Secondary | ICD-10-CM

## 2019-12-23 LAB — CUP PACEART REMOTE DEVICE CHECK
Battery Remaining Longevity: 112 mo
Battery Voltage: 2.99 V
Brady Statistic AP VP Percent: 84.11 %
Brady Statistic AP VS Percent: 3.04 %
Brady Statistic AS VP Percent: 2.61 %
Brady Statistic AS VS Percent: 10.24 %
Brady Statistic RA Percent Paced: 93.22 %
Brady Statistic RV Percent Paced: 86.72 %
Date Time Interrogation Session: 20210330021340
Implantable Lead Implant Date: 20180424
Implantable Lead Implant Date: 20180424
Implantable Lead Location: 753859
Implantable Lead Location: 753860
Implantable Lead Model: 5076
Implantable Lead Model: 5076
Implantable Pulse Generator Implant Date: 20180424
Lead Channel Impedance Value: 285 Ohm
Lead Channel Impedance Value: 361 Ohm
Lead Channel Impedance Value: 399 Ohm
Lead Channel Impedance Value: 437 Ohm
Lead Channel Pacing Threshold Amplitude: 0.5 V
Lead Channel Pacing Threshold Amplitude: 0.625 V
Lead Channel Pacing Threshold Pulse Width: 0.4 ms
Lead Channel Pacing Threshold Pulse Width: 0.4 ms
Lead Channel Sensing Intrinsic Amplitude: 2 mV
Lead Channel Sensing Intrinsic Amplitude: 2 mV
Lead Channel Sensing Intrinsic Amplitude: 5.75 mV
Lead Channel Sensing Intrinsic Amplitude: 5.75 mV
Lead Channel Setting Pacing Amplitude: 1.5 V
Lead Channel Setting Pacing Amplitude: 2.5 V
Lead Channel Setting Pacing Pulse Width: 0.4 ms
Lead Channel Setting Sensing Sensitivity: 2 mV

## 2019-12-23 NOTE — Progress Notes (Signed)
PPM remote 

## 2019-12-25 DIAGNOSIS — I69391 Dysphagia following cerebral infarction: Secondary | ICD-10-CM | POA: Diagnosis not present

## 2019-12-25 DIAGNOSIS — I69342 Monoplegia of lower limb following cerebral infarction affecting left dominant side: Secondary | ICD-10-CM | POA: Diagnosis not present

## 2019-12-28 DIAGNOSIS — G1221 Amyotrophic lateral sclerosis: Secondary | ICD-10-CM | POA: Diagnosis not present

## 2019-12-28 DIAGNOSIS — R197 Diarrhea, unspecified: Secondary | ICD-10-CM | POA: Diagnosis not present

## 2019-12-28 DIAGNOSIS — J3489 Other specified disorders of nose and nasal sinuses: Secondary | ICD-10-CM | POA: Diagnosis not present

## 2019-12-28 DIAGNOSIS — R05 Cough: Secondary | ICD-10-CM | POA: Diagnosis not present

## 2020-01-02 ENCOUNTER — Ambulatory Visit: Payer: Medicare HMO | Admitting: Podiatry

## 2020-01-06 ENCOUNTER — Ambulatory Visit: Payer: Medicare HMO | Admitting: Podiatry

## 2020-01-24 DIAGNOSIS — I69391 Dysphagia following cerebral infarction: Secondary | ICD-10-CM | POA: Diagnosis not present

## 2020-01-24 DIAGNOSIS — I69342 Monoplegia of lower limb following cerebral infarction affecting left dominant side: Secondary | ICD-10-CM | POA: Diagnosis not present

## 2020-01-27 DIAGNOSIS — J3489 Other specified disorders of nose and nasal sinuses: Secondary | ICD-10-CM | POA: Diagnosis not present

## 2020-01-27 DIAGNOSIS — G1221 Amyotrophic lateral sclerosis: Secondary | ICD-10-CM | POA: Diagnosis not present

## 2020-01-27 DIAGNOSIS — R05 Cough: Secondary | ICD-10-CM | POA: Diagnosis not present

## 2020-01-27 DIAGNOSIS — R197 Diarrhea, unspecified: Secondary | ICD-10-CM | POA: Diagnosis not present

## 2020-02-09 DIAGNOSIS — I129 Hypertensive chronic kidney disease with stage 1 through stage 4 chronic kidney disease, or unspecified chronic kidney disease: Secondary | ICD-10-CM | POA: Diagnosis not present

## 2020-02-09 DIAGNOSIS — N2889 Other specified disorders of kidney and ureter: Secondary | ICD-10-CM | POA: Diagnosis not present

## 2020-02-09 DIAGNOSIS — N183 Chronic kidney disease, stage 3 unspecified: Secondary | ICD-10-CM | POA: Diagnosis not present

## 2020-02-11 ENCOUNTER — Telehealth: Payer: Self-pay

## 2020-02-11 NOTE — Telephone Encounter (Signed)
Volunteer support call for palliative care, message left 

## 2020-02-21 ENCOUNTER — Other Ambulatory Visit: Payer: Self-pay | Admitting: Family Medicine

## 2020-02-21 DIAGNOSIS — I69354 Hemiplegia and hemiparesis following cerebral infarction affecting left non-dominant side: Secondary | ICD-10-CM

## 2020-02-24 DIAGNOSIS — I69391 Dysphagia following cerebral infarction: Secondary | ICD-10-CM | POA: Diagnosis not present

## 2020-02-24 DIAGNOSIS — I69342 Monoplegia of lower limb following cerebral infarction affecting left dominant side: Secondary | ICD-10-CM | POA: Diagnosis not present

## 2020-02-27 DIAGNOSIS — R197 Diarrhea, unspecified: Secondary | ICD-10-CM | POA: Diagnosis not present

## 2020-02-27 DIAGNOSIS — J3489 Other specified disorders of nose and nasal sinuses: Secondary | ICD-10-CM | POA: Diagnosis not present

## 2020-02-27 DIAGNOSIS — G1221 Amyotrophic lateral sclerosis: Secondary | ICD-10-CM | POA: Diagnosis not present

## 2020-02-27 DIAGNOSIS — R05 Cough: Secondary | ICD-10-CM | POA: Diagnosis not present

## 2020-03-19 DIAGNOSIS — H524 Presbyopia: Secondary | ICD-10-CM | POA: Diagnosis not present

## 2020-03-19 DIAGNOSIS — H401134 Primary open-angle glaucoma, bilateral, indeterminate stage: Secondary | ICD-10-CM | POA: Diagnosis not present

## 2020-03-19 DIAGNOSIS — H5212 Myopia, left eye: Secondary | ICD-10-CM | POA: Diagnosis not present

## 2020-03-19 DIAGNOSIS — H2513 Age-related nuclear cataract, bilateral: Secondary | ICD-10-CM | POA: Diagnosis not present

## 2020-03-23 ENCOUNTER — Ambulatory Visit (INDEPENDENT_AMBULATORY_CARE_PROVIDER_SITE_OTHER): Payer: Medicare HMO | Admitting: *Deleted

## 2020-03-23 DIAGNOSIS — I459 Conduction disorder, unspecified: Secondary | ICD-10-CM | POA: Diagnosis not present

## 2020-03-23 LAB — CUP PACEART REMOTE DEVICE CHECK
Battery Remaining Longevity: 106 mo
Battery Voltage: 2.98 V
Brady Statistic AP VP Percent: 88.95 %
Brady Statistic AP VS Percent: 1.62 %
Brady Statistic AS VP Percent: 3.79 %
Brady Statistic AS VS Percent: 5.64 %
Brady Statistic RA Percent Paced: 91.63 %
Brady Statistic RV Percent Paced: 92.74 %
Date Time Interrogation Session: 20210629020544
Implantable Lead Implant Date: 20180424
Implantable Lead Implant Date: 20180424
Implantable Lead Location: 753859
Implantable Lead Location: 753860
Implantable Lead Model: 5076
Implantable Lead Model: 5076
Implantable Pulse Generator Implant Date: 20180424
Lead Channel Impedance Value: 285 Ohm
Lead Channel Impedance Value: 323 Ohm
Lead Channel Impedance Value: 380 Ohm
Lead Channel Impedance Value: 399 Ohm
Lead Channel Pacing Threshold Amplitude: 0.5 V
Lead Channel Pacing Threshold Amplitude: 0.625 V
Lead Channel Pacing Threshold Pulse Width: 0.4 ms
Lead Channel Pacing Threshold Pulse Width: 0.4 ms
Lead Channel Sensing Intrinsic Amplitude: 1.375 mV
Lead Channel Sensing Intrinsic Amplitude: 1.375 mV
Lead Channel Sensing Intrinsic Amplitude: 7.375 mV
Lead Channel Sensing Intrinsic Amplitude: 7.375 mV
Lead Channel Setting Pacing Amplitude: 1.5 V
Lead Channel Setting Pacing Amplitude: 2.5 V
Lead Channel Setting Pacing Pulse Width: 0.4 ms
Lead Channel Setting Sensing Sensitivity: 2 mV

## 2020-03-24 NOTE — Progress Notes (Signed)
Remote pacemaker transmission.   

## 2020-03-25 DIAGNOSIS — I69342 Monoplegia of lower limb following cerebral infarction affecting left dominant side: Secondary | ICD-10-CM | POA: Diagnosis not present

## 2020-03-25 DIAGNOSIS — I69391 Dysphagia following cerebral infarction: Secondary | ICD-10-CM | POA: Diagnosis not present

## 2020-03-28 DIAGNOSIS — J3489 Other specified disorders of nose and nasal sinuses: Secondary | ICD-10-CM | POA: Diagnosis not present

## 2020-03-28 DIAGNOSIS — R05 Cough: Secondary | ICD-10-CM | POA: Diagnosis not present

## 2020-03-28 DIAGNOSIS — G1221 Amyotrophic lateral sclerosis: Secondary | ICD-10-CM | POA: Diagnosis not present

## 2020-03-28 DIAGNOSIS — R197 Diarrhea, unspecified: Secondary | ICD-10-CM | POA: Diagnosis not present

## 2020-03-31 ENCOUNTER — Ambulatory Visit: Payer: Medicare HMO | Attending: Family Medicine | Admitting: Family Medicine

## 2020-03-31 ENCOUNTER — Other Ambulatory Visit: Payer: Self-pay

## 2020-03-31 ENCOUNTER — Encounter: Payer: Self-pay | Admitting: Family Medicine

## 2020-03-31 VITALS — BP 132/76 | HR 59 | Ht 74.0 in

## 2020-03-31 DIAGNOSIS — J3489 Other specified disorders of nose and nasal sinuses: Secondary | ICD-10-CM

## 2020-03-31 DIAGNOSIS — G1220 Motor neuron disease, unspecified: Secondary | ICD-10-CM

## 2020-03-31 DIAGNOSIS — R569 Unspecified convulsions: Secondary | ICD-10-CM | POA: Diagnosis not present

## 2020-03-31 DIAGNOSIS — I10 Essential (primary) hypertension: Secondary | ICD-10-CM | POA: Diagnosis not present

## 2020-03-31 DIAGNOSIS — Z1211 Encounter for screening for malignant neoplasm of colon: Secondary | ICD-10-CM | POA: Diagnosis not present

## 2020-03-31 DIAGNOSIS — R195 Other fecal abnormalities: Secondary | ICD-10-CM | POA: Diagnosis not present

## 2020-03-31 DIAGNOSIS — I69354 Hemiplegia and hemiparesis following cerebral infarction affecting left non-dominant side: Secondary | ICD-10-CM

## 2020-03-31 MED ORDER — CETIRIZINE HCL 10 MG PO TABS: 10.0000 mg | ORAL_TABLET | Freq: Every day | ORAL | 1 refills | Status: DC

## 2020-03-31 MED ORDER — ATORVASTATIN CALCIUM 40 MG PO TABS: 40.0000 mg | ORAL_TABLET | Freq: Every day | ORAL | 1 refills | Status: DC

## 2020-03-31 MED ORDER — FLUTICASONE PROPIONATE 50 MCG/ACT NA SUSP: 2.0000 | Freq: Every day | NASAL | 3 refills | Status: DC

## 2020-03-31 MED ORDER — TAMSULOSIN HCL 0.4 MG PO CAPS: 0.4000 mg | ORAL_CAPSULE | Freq: Every day | ORAL | 1 refills | Status: DC

## 2020-03-31 NOTE — Progress Notes (Signed)
Patient is having pain in his left ankle bone.  Still having black stool

## 2020-03-31 NOTE — Progress Notes (Signed)
Subjective:  Patient ID: Darrell [name], male    DOB: 02-May-1951  Age: 69 y.o. MRN: [id number]  CC: Hypertension   HPI [name] is a 69 year old male with a history of hypertension, previous CVA with residual left-sided weakness, seizure in the setting of prior CVA, second degree AV block (status post medtronic dual chamber pacemaker) stage III CKD, motor neuron disease/ALS who is seen today for chronic disease management.  His spouse provides most of the history. He has had dark stools but has not noticed bright red blood in his stools; of note he is on iron tablets. He has has congestion and been unable to cough up. Spouse has not remembered to use the suction device which I had prescribed for her previously.  He has had no choking episodes but his voice has become weaker.  Was also supposed to be on Zyrtec which he has run out of.  His Nephrologist disconitnued  Lasix but he is compliant with his other antihypertensives. Last seen by neurology for management of seizures 2 months ago and has had no recent seizures but he is yet to have an appointment at the [facility].  Previously underwent physical therapy at [facility] but has not done so in the last year due to the pandemic. He has no chest pain or dyspnea.  Past Medical History:  Diagnosis Date  . Arthritis   . At high risk for falls 08/16/2015  . Bradycardia   . Chronic kidney disease    stage 3 GFR 30-59 ml/min   . CIDP (chronic inflammatory demyelinating polyneuropathy) ([place])   . CKD (chronic kidney disease) stage 3, GFR 30-59 ml/min 08/16/2015  . Dysphagia as late effect of cerebrovascular disease    pts wife states pt has to eat soft foods   . Elevated liver enzymes 08/10/2016  . GERD (gastroesophageal reflux disease)   . Glaucoma   . High cholesterol   . History of CVA with residual deficit 03/25/2013  . Hypertension   . Hypertensive retinopathy of both eyes 01/16/2017  . Inguinal hernia 03/25/2013    . Liver hemangioma 08/14/2016  . Neuromuscular disorder ([place])    chronic inflammatory demyelinating polyneuropathy   . New onset seizure ([place]) 07/08/2017   seizure 07/14/18  . Nuclear sclerosis of both eyes 10/25/2016  . Presence of permanent cardiac pacemaker    placed in april 2018  . Primary open angle glaucoma of both eyes, indeterminate stage 10/25/2016  . Renal mass, right 08/14/2016  . Status cardiac pacemaker 01/29/2017   Placed for second degree heart block on 01/16/17 Medtronic Azure XT DR MRI SureScan dual-chamber pacemaker  . Stroke [facility])    2011 with residual deficit left sided weakness  . Tobacco dependence     Past Surgical History:  Procedure Laterality Date  . EYE SURGERY    . HERNIA REPAIR    . INGUINAL HERNIA REPAIR Right 11/23/2014   Procedure: right inguinal hernia repair with mesh;  Surgeon: Armandina Gemma, MD;  Location: [facility];  Service: General;  Laterality: Right;  . INSERTION OF MESH N/A 11/23/2014   Procedure: INSERTION OF MESH;  Surgeon: Armandina Gemma, MD;  Location: [facility];  Service: General;  Laterality: N/A;  . MASS EXCISION Left 08/29/2017   Procedure: EXCISION OF LEFT NECK MASS;  Surgeon: Coralie Keens, MD;  Location: [place];  Service: General;  Laterality: Left;  . PACEMAKER IMPLANT N/A 01/16/2017   Procedure: Pacemaker Implant;  Surgeon: Will Meredith Leeds, MD;  Location: [facility] INVASIVE CV LAB;  Service: Cardiovascular;  Laterality: N/A;  . SHOULDER SURGERY Bilateral 1988, 1998    Family History  Problem Relation Age of Onset  . Hypertension Mother   . Diabetes Sister   . Hypertension Sister   . Cancer Sister        1 sister  . COPD Sister        in 1 sister  . Stomach cancer Neg Hx   . Colon cancer Neg Hx   . Pancreatic cancer Neg Hx   . Esophageal cancer Neg Hx     Allergies  Allergen Reactions  . Ace Inhibitors Other (See Comments)    Hyperkalemia  . Doxycycline Other (See Comments)    Hiccups, cough, nausea and emesis, elevated liver  enzymes, elevated eosinophils, SOB concerning for early DRESS syndrome   . Atacand Hct [Candesartan Cilexetil-Hctz] Hives  . Shellfish Allergy Hives    Outpatient Medications Prior to Visit  Medication Sig Dispense Refill  . aspirin EC 81 MG tablet Take 1 tablet (81 mg total) by mouth daily. 90 tablet 3  . brimonidine (ALPHAGAN P) 0.1 % SOLN Place 1 drop into both eyes daily.     . dorzolamide-timolol (COSOPT) 22.3-6.8 MG/ML ophthalmic solution Place 1 drop into both eyes daily.     . ferrous sulfate 325 (65 FE) MG tablet Take 1 tablet (325 mg total) by mouth 2 (two) times daily with a meal. 60 tablet 3  . guaiFENesin-dextromethorphan (ROBITUSSIN DM) 100-10 MG/5ML syrup Take 5 mLs by mouth every 6 (six) hours as needed for cough. 236 mL 0  . latanoprost (XALATAN) 0.005 % ophthalmic solution Place 1 drop into both eyes at bedtime.    . levETIRAcetam (KEPPRA) 500 MG tablet Take 1 tablet (500 mg total) by mouth 2 (two) times daily. 180 tablet 4  . metoprolol tartrate (LOPRESSOR) 50 MG tablet Take 1 tablet (50 mg total) by mouth 2 (two) times daily. 180 tablet 3  . Misc. Devices MISC Yankauer suction. Dx: ALS 1 each 0  . Multiple Vitamins-Iron (DAILY VITAMIN FORMULA+IRON) TABS Take 1 tablet by mouth daily.    Marland Kitchen atorvastatin (LIPITOR) 40 MG tablet Take 1 tablet by mouth once daily 30 tablet 0  . cetirizine (ZYRTEC) 10 MG tablet Take 1 tablet (10 mg total) by mouth daily. 30 tablet 3  . fluticasone (FLONASE) 50 MCG/ACT nasal spray Place 2 sprays into both nostrils daily. 16 g 3  . tamsulosin (FLOMAX) 0.4 MG CAPS capsule Take 1 capsule (0.4 mg total) by mouth daily. 90 capsule 1   No facility-administered medications prior to visit.     ROS Review of Systems  Constitutional: Negative for activity change and appetite change.  HENT: Negative for sinus pressure and sore throat.   Eyes: Negative for visual disturbance.  Respiratory: Positive for cough. Negative for chest tightness and shortness  of breath.   Cardiovascular: Negative for chest pain and leg swelling.  Gastrointestinal: Negative for abdominal distention, abdominal pain, constipation and diarrhea.  Endocrine: Negative.   Genitourinary: Negative for dysuria.  Musculoskeletal: Negative for joint swelling and myalgias.  Skin: Negative for rash.  Allergic/Immunologic: Negative.   Neurological: Positive for speech difficulty and weakness. Negative for light-headedness and numbness.  Psychiatric/Behavioral: Negative for dysphoric mood and suicidal ideas.    Objective:  BP 132/76   Pulse (!) 59   Ht 6\' 2"  (1.88 m)   SpO2 98%   BMI 21.18 kg/m   BP/Weight 03/31/2020 12/15/2019 08/20/2019  Systolic  BP 409 811 914  Diastolic BP 76 81 67  Wt. (Lbs) - - -  BMI 21.18 21.18 21.18      Physical Exam Constitutional:      Appearance: He is well-developed.  Neck:     Vascular: No JVD.  Cardiovascular:     Rate and Rhythm: Normal rate.     Heart sounds: Normal heart sounds. No murmur heard.   Pulmonary:     Effort: Pulmonary effort is normal.     Breath sounds: Normal breath sounds. No wheezing or rales.  Chest:     Chest wall: No tenderness.  Abdominal:     General: Bowel sounds are normal. There is no distension.     Palpations: Abdomen is soft. There is no mass.     Tenderness: There is no abdominal tenderness.  Musculoskeletal:     Right lower leg: No edema.     Left lower leg: No edema.     Comments: Left upper extremity contractures and flexion deformity with left-sided spasticity  Neurological:     Mental Status: He is alert and oriented to person, place, and time.  Psychiatric:        Mood and Affect: Mood normal.     CMP Latest Ref Rng & Units 03/31/2020 05/08/2019 05/07/2019  Glucose 65 - 99 mg/dL 95 91 117(H)  BUN 8 - 27 mg/dL 24 64(H) 71(H)  Creatinine 0.76 - 1.27 mg/dL 2.09(H) 2.78(H) 3.31(H)  Sodium 134 - 144 mmol/L 142 138 139  Potassium 3.5 - 5.2 mmol/L 5.1 4.5 4.6  Chloride 96 - 106 mmol/L  107(H) 112(H) 112(H)  CO2 20 - 29 mmol/L 27 21(L) 20(L)  Calcium 8.6 - 10.2 mg/dL 10.4(H) 9.2 9.1  Total Protein 6.0 - 8.5 g/dL 6.0 - -  Total Bilirubin 0.0 - 1.2 mg/dL 0.2 - -  Alkaline Phos 48 - 121 IU/L 108 - -  AST 0 - 40 IU/L 14 - -  ALT 0 - 44 IU/L 11 - -    Lipid Panel     Component Value Date/Time   CHOL 112 06/13/2018 0954   TRIG 51 06/13/2018 0954   HDL 40 06/13/2018 0954   CHOLHDL 2.8 06/13/2018 0954   CHOLHDL 4.1 08/16/2015 1555   VLDL 23 08/16/2015 1555   LDLCALC 62 06/13/2018 0954    CBC    Component Value Date/Time   WBC 7.0 03/31/2020 1417   WBC 8.6 05/08/2019 0524   RBC 3.33 (L) 03/31/2020 1417   RBC 2.84 (L) 05/08/2019 0524   HGB 10.2 (L) 03/31/2020 1417   HCT 31.4 (L) 03/31/2020 1417   PLT 227 03/31/2020 1417   MCV 94 03/31/2020 1417   MCH 30.6 03/31/2020 1417   MCH 29.2 05/08/2019 0524   MCHC 32.5 03/31/2020 1417   MCHC 30.5 05/08/2019 0524   RDW 12.6 03/31/2020 1417   LYMPHSABS 2.3 03/31/2020 1417   MONOABS 0.7 05/07/2019 0529   EOSABS 0.5 (H) 03/31/2020 1417   BASOSABS 0.1 03/31/2020 1417    Lab Results  Component Value Date   HGBA1C 5.1 08/20/2019    Assessment & Plan:  1. Hemiparesis affecting left side as late effect of stroke ([place]) Not undergoing physical therapy at this time Underlying motor neuron disease also contributing to weakness and spasticity - atorvastatin (LIPITOR) 40 MG tablet; Take 1 tablet (40 mg total) by mouth daily.  Dispense: 90 tablet; Refill: 1  2. Sinus drainage He does have excessive secretions which could be causing his cough but  has not remembered to use his suction Encourage spouse to use suction Has been out of Zyrtec which I have refilled  We will order CoughAssist device - cetirizine (ZYRTEC) 10 MG tablet; Take 1 tablet (10 mg total) by mouth daily.  Dispense: 90 tablet; Refill: 1 - fluticasone (FLONASE) 50 MCG/ACT nasal spray; Place 2 sprays into both nostrils daily.  Dispense: 16 g; Refill: 3  3.  Screening for colon cancer In the light of dark stools we will screen for colon cancer He is unable to undergo colonoscopy due to risks of weighing benefits given his underlying comorbidities - CBC with Differential/Platelet - Cologuard  4. Essential hypertension Controlled Counseled on blood pressure goal of less than 130/80, low-sodium, DASH diet, medication compliance, 150 minutes of moderate intensity exercise per week. Discussed medication compliance, adverse effects. - CMP14+EGFR  5. Dark stools Could be secondary to iron tablets but we will check his CBC  6. 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[place]) Seen by palliative care in 04/2019 and goals of care had been discussed  I will have the case manager reach out to her thyroid care again to ensure palliative care is on board Followed by [place] [facility] at [facility] He has not had an appointment in the last year and has been advised to schedule one Consulted with in-house pulmonologist-Dr. Joya Gaskins with additional recommendations including CoughAssist device, nebulizer treatment I will refer him to pulmonary at [facility] for pulmonary input and management  7. Seizures ([place]) Stable, no recent seizures Managed by neurology   Health Care Maintenance: Cologuard ordered to screen for colonoscopy Meds ordered this encounter  Medications  . cetirizine (ZYRTEC) 10 MG tablet    Sig: Take 1 tablet (10 mg total) by mouth daily.    Dispense:  90 tablet    Refill:  1  . atorvastatin (LIPITOR) 40 MG tablet    Sig: Take 1 tablet (40 mg total) by mouth daily.    Dispense:  90 tablet    Refill:  1  . fluticasone (FLONASE) 50 MCG/ACT nasal spray    Sig: Place 2 sprays into both nostrils daily.    Dispense:  16 g    Refill:  3  . tamsulosin (FLOMAX) 0.4 MG CAPS capsule    Sig: Take 1 capsule (0.4 mg total) by mouth daily.    Dispense:  90 capsule    Refill:  1    Follow-up: Return in about 4 months (around  08/01/2020) for Chronic disease management.    60 minutes of total face to face time spent and greater than 50% of time spent on discussing diagnosis, investigations, treatment plan, counseling, consulting with in-house pulmonologist, coordination of care with the case manager, [place] palliative care and DME company.    [name], MD, FAAFP. [facility], [place]   04/01/2020, 8:16 AM

## 2020-04-01 ENCOUNTER — Telehealth: Payer: Self-pay

## 2020-04-01 LAB — CMP14+EGFR
ALT: 11 IU/L (ref 0–44)
AST: 14 IU/L (ref 0–40)
Albumin/Globulin Ratio: 1.4 (ref 1.2–2.2)
Albumin: 3.5 g/dL — ABNORMAL LOW (ref 3.8–4.8)
Alkaline Phosphatase: 108 IU/L (ref 48–121)
BUN/Creatinine Ratio: 11 (ref 10–24)
BUN: 24 mg/dL (ref 8–27)
Bilirubin Total: 0.2 mg/dL (ref 0.0–1.2)
CO2: 27 mmol/L (ref 20–29)
Calcium: 10.4 mg/dL — ABNORMAL HIGH (ref 8.6–10.2)
Chloride: 107 mmol/L — ABNORMAL HIGH (ref 96–106)
Creatinine, Ser: 2.09 mg/dL — ABNORMAL HIGH (ref 0.76–1.27)
GFR calc Af Amer: 37 mL/min/{1.73_m2} — ABNORMAL LOW (ref >59)
GFR calc non Af Amer: 32 mL/min/{1.73_m2} — ABNORMAL LOW (ref >59)
Globulin, Total: 2.5 g/dL (ref 1.5–4.5)
Glucose: 95 mg/dL (ref 65–99)
Potassium: 5.1 mmol/L (ref 3.5–5.2)
Sodium: 142 mmol/L (ref 134–144)
Total Protein: 6 g/dL (ref 6.0–8.5)

## 2020-04-01 LAB — CBC WITH DIFFERENTIAL/PLATELET
Basophils Absolute: 0.1 10*3/uL (ref 0.0–0.2)
Basos: 1 %
EOS (ABSOLUTE): 0.5 10*3/uL — ABNORMAL HIGH (ref 0.0–0.4)
Eos: 7 %
Hematocrit: 31.4 % — ABNORMAL LOW (ref 37.5–51.0)
Hemoglobin: 10.2 g/dL — ABNORMAL LOW (ref 13.0–17.7)
Immature Grans (Abs): 0 10*3/uL (ref 0.0–0.1)
Immature Granulocytes: 0 %
Lymphocytes Absolute: 2.3 10*3/uL (ref 0.7–3.1)
Lymphs: 32 %
MCH: 30.6 pg (ref 26.6–33.0)
MCHC: 32.5 g/dL (ref 31.5–35.7)
MCV: 94 fL (ref 79–97)
Monocytes Absolute: 0.7 10*3/uL (ref 0.1–0.9)
Monocytes: 10 %
Neutrophils Absolute: 3.5 10*3/uL (ref 1.4–7.0)
Neutrophils: 50 %
Platelets: 227 10*3/uL (ref 150–450)
RBC: 3.33 x10E6/uL — ABNORMAL LOW (ref 4.14–5.80)
RDW: 12.6 % (ref 11.6–15.4)
WBC: 7 10*3/uL (ref 3.4–10.8)

## 2020-04-01 MED ORDER — MISC. DEVICES MISC
0 refills | Status: DC
Start: 2020-04-01 — End: 2022-05-17

## 2020-04-01 NOTE — Telephone Encounter (Signed)
Call placed to patient's wife, [name] to discuss patient's care needs, DME orders, home health services,palliatvie care.  She said she was at the store. Instructed her to call this CM back when she has a chance.  She has the clinic phone number.  Message sent to Joellyn Quails, RN/THN inquiring if [facility] is able to provide any additional support/services

## 2020-04-02 ENCOUNTER — Other Ambulatory Visit: Payer: Self-pay | Admitting: Family Medicine

## 2020-04-02 ENCOUNTER — Telehealth: Payer: Self-pay

## 2020-04-02 DIAGNOSIS — J3489 Other specified disorders of nose and nasal sinuses: Secondary | ICD-10-CM

## 2020-04-02 NOTE — Telephone Encounter (Signed)
Patient name and DOB has been verified Patient was informed of lab results. Patient had no questions.  

## 2020-04-02 NOTE — Telephone Encounter (Addendum)
Call placed to patient's wife. Explained to her that order has been placed for the coughalator and nebulizer.  She said that her husband's other supplies have come from [employer]. Informed her that these orders would be faxed to [place].   Regarding the home health order - she was pleased with the services from [facility] in the past and requested that the order be sent there.   She explained he receives about $1400/month income and they have been told that he would not qualify for medicaid so he would not be eligible for PCS or CAP.  Discussed the option of a referral to palliative care for supportive services. Explained the difference between palliative and hospice care.  She said that hospice was mentioned in the past and she was scared of that.  Informed her that the referral would be placed for palliative, not hospice care, and she was in agreement.   She explained that she is managing the patient's care day to day. Little support from family  She explained how she tends to keep her stress to herself and is ends up coming out on her family.  She said she would love to be able to go back to church.  Explained to her about the role of Christa See, LCSW and she was very interested.  She said that she would like to speak with her.  Informed her that jasmine would be notified of this request and may be able to contact her next week.  She was very Patent attorney.

## 2020-04-02 NOTE — Telephone Encounter (Signed)
Wife is returning a call to [place].  Tried the office but there was no answer.  Please call back to discuss at [phone]

## 2020-04-02 NOTE — Telephone Encounter (Signed)
-----   Message from [name], MD sent at 04/01/2020  8:44 AM EDT ----- Labs reveal he is anemic and this could be secondary to his chronic kidney disease versus GI bleed.  Once we receive results of his colonoscopy we will determine if GI referral is indicated at this time.  Due to his chronic comorbidities I will hold off on referral for invasive procedure at this time.

## 2020-04-04 ENCOUNTER — Other Ambulatory Visit: Payer: Self-pay | Admitting: Family Medicine

## 2020-04-04 DIAGNOSIS — J3489 Other specified disorders of nose and nasal sinuses: Secondary | ICD-10-CM

## 2020-04-05 ENCOUNTER — Telehealth: Payer: Self-pay

## 2020-04-05 DIAGNOSIS — G1220 Motor neuron disease, unspecified: Secondary | ICD-10-CM | POA: Diagnosis not present

## 2020-04-05 NOTE — Telephone Encounter (Signed)
02/25/20: Palliative volunteer check in call attempt, no answer 

## 2020-04-05 NOTE — Telephone Encounter (Signed)
Call placed to [facility] to [facility] who requested that the referral be faxed for review # [phone].  Referral faxed as requested.   Call placed to [facility], spoke to [place] who said that they already have a referral. She will have the nurse reach out again to patient's wife and schedule a visit.  This CM explained that his wife is interested in palliative care, not hospice. She would like information about resources to help support her care for him at home.

## 2020-04-06 ENCOUNTER — Telehealth: Payer: Self-pay

## 2020-04-06 ENCOUNTER — Telehealth: Payer: Self-pay | Admitting: Internal Medicine

## 2020-04-06 NOTE — Telephone Encounter (Signed)
Call placed to [place], spoke to [place] who said that the nebulizer was shipped to the patient 04/02/2020.  She could not find the order for the coughalator that was sent with the nebulizer order.   Order for coughalator refaxed to [place]

## 2020-04-06 NOTE — Telephone Encounter (Signed)
Scheduled [facility] Palliative visit for 04-20-20 at 1:00.

## 2020-04-07 ENCOUNTER — Telehealth: Payer: Self-pay

## 2020-04-07 NOTE — Telephone Encounter (Signed)
Call received from Lisa/[facility] who stated that they can accept the referral and will contact the patient's wife.   As per [place] they are not providing any in home services at this time, only telephonic support.

## 2020-04-13 DIAGNOSIS — G1220 Motor neuron disease, unspecified: Secondary | ICD-10-CM | POA: Diagnosis not present

## 2020-04-14 DIAGNOSIS — I69391 Dysphagia following cerebral infarction: Secondary | ICD-10-CM | POA: Diagnosis not present

## 2020-04-14 DIAGNOSIS — R69 Illness, unspecified: Secondary | ICD-10-CM | POA: Diagnosis not present

## 2020-04-14 DIAGNOSIS — R1310 Dysphagia, unspecified: Secondary | ICD-10-CM | POA: Diagnosis not present

## 2020-04-14 DIAGNOSIS — N184 Chronic kidney disease, stage 4 (severe): Secondary | ICD-10-CM | POA: Diagnosis not present

## 2020-04-14 DIAGNOSIS — G1220 Motor neuron disease, unspecified: Secondary | ICD-10-CM | POA: Diagnosis not present

## 2020-04-14 DIAGNOSIS — D631 Anemia in chronic kidney disease: Secondary | ICD-10-CM | POA: Diagnosis not present

## 2020-04-14 DIAGNOSIS — G40909 Epilepsy, unspecified, not intractable, without status epilepticus: Secondary | ICD-10-CM | POA: Diagnosis not present

## 2020-04-14 DIAGNOSIS — I129 Hypertensive chronic kidney disease with stage 1 through stage 4 chronic kidney disease, or unspecified chronic kidney disease: Secondary | ICD-10-CM | POA: Diagnosis not present

## 2020-04-14 DIAGNOSIS — I69354 Hemiplegia and hemiparesis following cerebral infarction affecting left non-dominant side: Secondary | ICD-10-CM | POA: Diagnosis not present

## 2020-04-14 DIAGNOSIS — M1990 Unspecified osteoarthritis, unspecified site: Secondary | ICD-10-CM | POA: Diagnosis not present

## 2020-04-15 ENCOUNTER — Telehealth: Payer: Self-pay | Admitting: Family Medicine

## 2020-04-15 DIAGNOSIS — I129 Hypertensive chronic kidney disease with stage 1 through stage 4 chronic kidney disease, or unspecified chronic kidney disease: Secondary | ICD-10-CM | POA: Diagnosis not present

## 2020-04-15 DIAGNOSIS — I69391 Dysphagia following cerebral infarction: Secondary | ICD-10-CM | POA: Diagnosis not present

## 2020-04-15 DIAGNOSIS — N184 Chronic kidney disease, stage 4 (severe): Secondary | ICD-10-CM | POA: Diagnosis not present

## 2020-04-15 DIAGNOSIS — G1220 Motor neuron disease, unspecified: Secondary | ICD-10-CM | POA: Diagnosis not present

## 2020-04-15 DIAGNOSIS — I69354 Hemiplegia and hemiparesis following cerebral infarction affecting left non-dominant side: Secondary | ICD-10-CM | POA: Diagnosis not present

## 2020-04-15 DIAGNOSIS — M1990 Unspecified osteoarthritis, unspecified site: Secondary | ICD-10-CM | POA: Diagnosis not present

## 2020-04-15 DIAGNOSIS — R1310 Dysphagia, unspecified: Secondary | ICD-10-CM | POA: Diagnosis not present

## 2020-04-15 DIAGNOSIS — R69 Illness, unspecified: Secondary | ICD-10-CM | POA: Diagnosis not present

## 2020-04-15 DIAGNOSIS — G40909 Epilepsy, unspecified, not intractable, without status epilepticus: Secondary | ICD-10-CM | POA: Diagnosis not present

## 2020-04-15 DIAGNOSIS — D631 Anemia in chronic kidney disease: Secondary | ICD-10-CM | POA: Diagnosis not present

## 2020-04-15 NOTE — Telephone Encounter (Signed)
Referral for respiratory therapy needs to go somewhere else separate.

## 2020-04-15 NOTE — Telephone Encounter (Signed)
Copied from [place] [phone]. Topic: General - Other >> Apr 15, 2020 10:59 AM Rainey Pines A wrote: [facility] called and stated that when they received patients referrals orders that included respiratory therapy and they do not provide that . It will have to be redirected to the dme company.

## 2020-04-17 DIAGNOSIS — I69391 Dysphagia following cerebral infarction: Secondary | ICD-10-CM | POA: Diagnosis not present

## 2020-04-17 DIAGNOSIS — G40909 Epilepsy, unspecified, not intractable, without status epilepticus: Secondary | ICD-10-CM | POA: Diagnosis not present

## 2020-04-17 DIAGNOSIS — M1990 Unspecified osteoarthritis, unspecified site: Secondary | ICD-10-CM | POA: Diagnosis not present

## 2020-04-17 DIAGNOSIS — I69354 Hemiplegia and hemiparesis following cerebral infarction affecting left non-dominant side: Secondary | ICD-10-CM | POA: Diagnosis not present

## 2020-04-17 DIAGNOSIS — I129 Hypertensive chronic kidney disease with stage 1 through stage 4 chronic kidney disease, or unspecified chronic kidney disease: Secondary | ICD-10-CM | POA: Diagnosis not present

## 2020-04-17 DIAGNOSIS — G1220 Motor neuron disease, unspecified: Secondary | ICD-10-CM | POA: Diagnosis not present

## 2020-04-17 DIAGNOSIS — R69 Illness, unspecified: Secondary | ICD-10-CM | POA: Diagnosis not present

## 2020-04-17 DIAGNOSIS — D631 Anemia in chronic kidney disease: Secondary | ICD-10-CM | POA: Diagnosis not present

## 2020-04-17 DIAGNOSIS — N184 Chronic kidney disease, stage 4 (severe): Secondary | ICD-10-CM | POA: Diagnosis not present

## 2020-04-17 DIAGNOSIS — R1310 Dysphagia, unspecified: Secondary | ICD-10-CM | POA: Diagnosis not present

## 2020-04-20 ENCOUNTER — Other Ambulatory Visit: Payer: Medicare HMO | Admitting: Internal Medicine

## 2020-04-21 ENCOUNTER — Ambulatory Visit: Payer: Medicare HMO | Admitting: Podiatry

## 2020-04-21 ENCOUNTER — Telehealth: Payer: Self-pay | Admitting: Family Medicine

## 2020-04-21 DIAGNOSIS — G1220 Motor neuron disease, unspecified: Secondary | ICD-10-CM | POA: Diagnosis not present

## 2020-04-21 DIAGNOSIS — I129 Hypertensive chronic kidney disease with stage 1 through stage 4 chronic kidney disease, or unspecified chronic kidney disease: Secondary | ICD-10-CM | POA: Diagnosis not present

## 2020-04-21 DIAGNOSIS — I69391 Dysphagia following cerebral infarction: Secondary | ICD-10-CM | POA: Diagnosis not present

## 2020-04-21 DIAGNOSIS — G40909 Epilepsy, unspecified, not intractable, without status epilepticus: Secondary | ICD-10-CM | POA: Diagnosis not present

## 2020-04-21 DIAGNOSIS — M1990 Unspecified osteoarthritis, unspecified site: Secondary | ICD-10-CM | POA: Diagnosis not present

## 2020-04-21 DIAGNOSIS — N184 Chronic kidney disease, stage 4 (severe): Secondary | ICD-10-CM | POA: Diagnosis not present

## 2020-04-21 DIAGNOSIS — R1310 Dysphagia, unspecified: Secondary | ICD-10-CM | POA: Diagnosis not present

## 2020-04-21 DIAGNOSIS — R69 Illness, unspecified: Secondary | ICD-10-CM | POA: Diagnosis not present

## 2020-04-21 DIAGNOSIS — D631 Anemia in chronic kidney disease: Secondary | ICD-10-CM | POA: Diagnosis not present

## 2020-04-21 DIAGNOSIS — I69354 Hemiplegia and hemiparesis following cerebral infarction affecting left non-dominant side: Secondary | ICD-10-CM | POA: Diagnosis not present

## 2020-04-21 NOTE — Telephone Encounter (Signed)
Home Health Verbal Orders - Caller/Agency: [facility] Number: [phone] Requesting OT/PT/Skilled Nursing/Social Work/Speech Therapy: Speech therapy related to his voice ALS  Frequency:  1 week 5

## 2020-04-22 ENCOUNTER — Other Ambulatory Visit: Payer: Medicare HMO | Admitting: Internal Medicine

## 2020-04-22 ENCOUNTER — Other Ambulatory Visit: Payer: Self-pay

## 2020-04-22 DIAGNOSIS — Z515 Encounter for palliative care: Secondary | ICD-10-CM

## 2020-04-22 DIAGNOSIS — D631 Anemia in chronic kidney disease: Secondary | ICD-10-CM | POA: Diagnosis not present

## 2020-04-22 DIAGNOSIS — N184 Chronic kidney disease, stage 4 (severe): Secondary | ICD-10-CM | POA: Diagnosis not present

## 2020-04-22 DIAGNOSIS — R531 Weakness: Secondary | ICD-10-CM | POA: Diagnosis not present

## 2020-04-22 DIAGNOSIS — R69 Illness, unspecified: Secondary | ICD-10-CM | POA: Diagnosis not present

## 2020-04-22 DIAGNOSIS — I69354 Hemiplegia and hemiparesis following cerebral infarction affecting left non-dominant side: Secondary | ICD-10-CM | POA: Diagnosis not present

## 2020-04-22 DIAGNOSIS — I69391 Dysphagia following cerebral infarction: Secondary | ICD-10-CM | POA: Diagnosis not present

## 2020-04-22 DIAGNOSIS — G40909 Epilepsy, unspecified, not intractable, without status epilepticus: Secondary | ICD-10-CM | POA: Diagnosis not present

## 2020-04-22 DIAGNOSIS — G1220 Motor neuron disease, unspecified: Secondary | ICD-10-CM | POA: Diagnosis not present

## 2020-04-22 DIAGNOSIS — I129 Hypertensive chronic kidney disease with stage 1 through stage 4 chronic kidney disease, or unspecified chronic kidney disease: Secondary | ICD-10-CM | POA: Diagnosis not present

## 2020-04-22 DIAGNOSIS — R1310 Dysphagia, unspecified: Secondary | ICD-10-CM | POA: Diagnosis not present

## 2020-04-22 DIAGNOSIS — M1990 Unspecified osteoarthritis, unspecified site: Secondary | ICD-10-CM | POA: Diagnosis not present

## 2020-04-22 NOTE — Telephone Encounter (Signed)
Darrell [name] was called and a voicemail was left giving her verbal orders for patient.

## 2020-04-23 DIAGNOSIS — G40909 Epilepsy, unspecified, not intractable, without status epilepticus: Secondary | ICD-10-CM | POA: Diagnosis not present

## 2020-04-23 DIAGNOSIS — I69391 Dysphagia following cerebral infarction: Secondary | ICD-10-CM | POA: Diagnosis not present

## 2020-04-23 DIAGNOSIS — I129 Hypertensive chronic kidney disease with stage 1 through stage 4 chronic kidney disease, or unspecified chronic kidney disease: Secondary | ICD-10-CM | POA: Diagnosis not present

## 2020-04-23 DIAGNOSIS — R69 Illness, unspecified: Secondary | ICD-10-CM | POA: Diagnosis not present

## 2020-04-23 DIAGNOSIS — N184 Chronic kidney disease, stage 4 (severe): Secondary | ICD-10-CM | POA: Diagnosis not present

## 2020-04-23 DIAGNOSIS — I69354 Hemiplegia and hemiparesis following cerebral infarction affecting left non-dominant side: Secondary | ICD-10-CM | POA: Diagnosis not present

## 2020-04-23 DIAGNOSIS — G1220 Motor neuron disease, unspecified: Secondary | ICD-10-CM | POA: Diagnosis not present

## 2020-04-23 DIAGNOSIS — R1310 Dysphagia, unspecified: Secondary | ICD-10-CM | POA: Diagnosis not present

## 2020-04-23 DIAGNOSIS — D631 Anemia in chronic kidney disease: Secondary | ICD-10-CM | POA: Diagnosis not present

## 2020-04-23 DIAGNOSIS — M1990 Unspecified osteoarthritis, unspecified site: Secondary | ICD-10-CM | POA: Diagnosis not present

## 2020-04-25 DIAGNOSIS — I69391 Dysphagia following cerebral infarction: Secondary | ICD-10-CM | POA: Diagnosis not present

## 2020-04-25 DIAGNOSIS — I69342 Monoplegia of lower limb following cerebral infarction affecting left dominant side: Secondary | ICD-10-CM | POA: Diagnosis not present

## 2020-04-27 ENCOUNTER — Telehealth: Payer: Self-pay | Admitting: Family Medicine

## 2020-04-27 NOTE — Telephone Encounter (Signed)
Would you please check with them to see if they want the documentation on the order or an additional note in his chart will  Suffice without having to re-open last office noted? Thanks.

## 2020-04-27 NOTE — Telephone Encounter (Signed)
They received wheel chair order but they need an addendum to the order stating that he can not use a cane or walker to be able to process the order / please advise

## 2020-04-27 NOTE — Telephone Encounter (Signed)
Patient had OV on 03/31/20 [facility] sent over an order for a manual wheelchair on 04/21/20 it was sign off on but they need for the office note to state he can not use a cane or walker.

## 2020-04-28 DIAGNOSIS — G1221 Amyotrophic lateral sclerosis: Secondary | ICD-10-CM | POA: Diagnosis not present

## 2020-04-28 DIAGNOSIS — R05 Cough: Secondary | ICD-10-CM | POA: Diagnosis not present

## 2020-04-28 DIAGNOSIS — J3489 Other specified disorders of nose and nasal sinuses: Secondary | ICD-10-CM | POA: Diagnosis not present

## 2020-04-28 DIAGNOSIS — R197 Diarrhea, unspecified: Secondary | ICD-10-CM | POA: Diagnosis not present

## 2020-04-28 NOTE — Telephone Encounter (Signed)
Call placed to [facility]. Spoke to [place] who said that an additional note stating the reason why the wheelchair is needed and a cane or walker is not sufficient is acceptable.  The note can be faxed to # [phone]

## 2020-04-28 NOTE — Telephone Encounter (Signed)
Please fax note to [facility]:  Darrell [name] is a 69 year old male with a history of hypertension, previous CVA with residual left-sided weakness, seizure in the setting of prior CVA, second degree AV block (status post medtronic dual chamber pacemaker) stage III CKD, motor neuron disease/ALS who is in need of manual wheelchair.  A cane or walker will not suffice as he does have spastic paralysis in his extremities and left hemiparesis.

## 2020-04-29 ENCOUNTER — Telehealth: Payer: Self-pay

## 2020-04-29 NOTE — Telephone Encounter (Signed)
Call placed to [place] to check on status of order for cougholator. Spoke to [place]/customer service and then Darrold Span who confirmed that the item was delivered 04/16/2020

## 2020-04-29 NOTE — Telephone Encounter (Signed)
Note has been faxed to number provided.

## 2020-04-30 NOTE — Progress Notes (Signed)
[facility] Consult Note Telephone: [phone]  Fax: [phone]  PATIENT NAME: Darrell [name] DOB: 1951/03/12 MRN: [id number]  PRIMARY CARE PROVIDER:   [name], MD  REFERRING PROVIDER:  [name], MD [place],  [place] [address]  RESPONSIBLE PARTY:   Darrell [name]) [phone] and self     RECOMMENDATIONS and PLAN:   Palliative care encounter Z51.5  1.  Advance Care Planning:   Goals of care reviewed and remain for residence at home and to improve strength. Pending initiation of home physical therapy and  CNA services by [facility].  Advanced directives were previously completed with full code and full scope of treatment status. Palliative care will F/U in aprox 4-6 weeks    2.  Dysphagia:  Unchanged. Aspiration precautions reviewed. Continue pureed diet.  Home speech therapy evaluation as needed. Monitor status.  3.  Weakness:  Chronic as related to advancement of motor neuron  disorder and previous CVA. Continue to f/u with neurologist.  I spent 45 minutes providing this consultation,  from 1100 to 1145. More than 50% of the time in this consultation was spent coordinating communication wife and pt.   HISTORY OF PRESENT ILLNESS: Follow-up with Darrell [name].  Wife/primary caregiver reports that he remains very weak, is non-ambulatory, bedridden and requires total care.  He requires use of oral suction periodically.  Appetite is good but he has lost weight. Pending GI evaluation of Cologuard stool sample related to dark stools.  No reports of recent illnesses or injuries.  Palliative Care was asked to help address goals of care.   CODE STATUS:  DNAR/DNI  PPS: 30% HOSPICE ELIGIBILITY/DIAGNOSIS: TBD  PAST MEDICAL HISTORY:  Past Medical History:  Diagnosis Date  . Arthritis   . At high risk for falls 08/16/2015  . Bradycardia   . Chronic kidney disease    stage 3 GFR 30-59 ml/min   . CIDP  (chronic inflammatory demyelinating polyneuropathy) ([place])   . CKD (chronic kidney disease) stage 3, GFR 30-59 ml/min ([name]) 08/16/2015  . Dysphagia as late effect of cerebrovascular disease    pts wife states pt has to eat soft foods   . Elevated liver enzymes 08/10/2016  . GERD (gastroesophageal reflux disease)   . Glaucoma   . High cholesterol   . History of CVA with residual deficit 03/25/2013  . Hypertension   . Hypertensive retinopathy of both eyes 01/16/2017  . Inguinal hernia 03/25/2013  . Liver hemangioma 08/14/2016  . Neuromuscular disorder ([place])    chronic inflammatory demyelinating polyneuropathy   . New onset seizure ([place]) 07/08/2017   seizure 07/14/18  . Nuclear sclerosis of both eyes 10/25/2016  . Presence of permanent cardiac pacemaker    placed in april 2018  . Primary open angle glaucoma of both eyes, indeterminate stage 10/25/2016  . Renal mass, right 08/14/2016  . Status cardiac pacemaker 01/29/2017   Placed for second degree heart block on 01/16/17 Medtronic Azure XT DR MRI SureScan dual-chamber pacemaker  . Stroke [facility])    2011 with residual deficit left sided weakness  . Tobacco dependence     PHYSICAL EXAM:   General: NAD, chronically ill, thin and frail appearing elderly male reclining in hospital bed.  Appears older than stated age Pulm:  Shallow breaths with noted upper airway congestion.  Moist but strong cough Card:  RRR GI:  + BS, soft and nontender Ext:  Muscle wasting.  No edema Neurological: Intermittent somnolence, oriented  to person and placeL hemiparesis and generalized weakness with whispered speech Psych:  In good spirits  Darrell [place], NP-C

## 2020-05-03 DIAGNOSIS — I129 Hypertensive chronic kidney disease with stage 1 through stage 4 chronic kidney disease, or unspecified chronic kidney disease: Secondary | ICD-10-CM | POA: Diagnosis not present

## 2020-05-03 DIAGNOSIS — G1220 Motor neuron disease, unspecified: Secondary | ICD-10-CM | POA: Diagnosis not present

## 2020-05-03 DIAGNOSIS — I69391 Dysphagia following cerebral infarction: Secondary | ICD-10-CM | POA: Diagnosis not present

## 2020-05-03 DIAGNOSIS — I69354 Hemiplegia and hemiparesis following cerebral infarction affecting left non-dominant side: Secondary | ICD-10-CM | POA: Diagnosis not present

## 2020-05-03 DIAGNOSIS — D631 Anemia in chronic kidney disease: Secondary | ICD-10-CM | POA: Diagnosis not present

## 2020-05-03 DIAGNOSIS — N184 Chronic kidney disease, stage 4 (severe): Secondary | ICD-10-CM | POA: Diagnosis not present

## 2020-05-03 DIAGNOSIS — R1310 Dysphagia, unspecified: Secondary | ICD-10-CM | POA: Diagnosis not present

## 2020-05-03 DIAGNOSIS — M1990 Unspecified osteoarthritis, unspecified site: Secondary | ICD-10-CM | POA: Diagnosis not present

## 2020-05-03 DIAGNOSIS — G40909 Epilepsy, unspecified, not intractable, without status epilepticus: Secondary | ICD-10-CM | POA: Diagnosis not present

## 2020-05-03 DIAGNOSIS — R69 Illness, unspecified: Secondary | ICD-10-CM | POA: Diagnosis not present

## 2020-05-05 ENCOUNTER — Ambulatory Visit: Payer: Medicare HMO | Attending: Family Medicine | Admitting: Licensed Clinical Social Worker

## 2020-05-05 DIAGNOSIS — F439 Reaction to severe stress, unspecified: Secondary | ICD-10-CM

## 2020-05-05 NOTE — BH Specialist Note (Signed)
Call placed to patient.  LCSW spoke with Ms. Juwon Scripter patient's spouse.  Ms. Bauer stated that at times she feels overwhelmed due to caregiver stress.  Encouragement and validation provided.  Mrs. Empson was successful in identifying healthy coping skills.  LCSW provided caregiver support resources via mail, per Mrs. Jaggers's request.

## 2020-05-07 DIAGNOSIS — I129 Hypertensive chronic kidney disease with stage 1 through stage 4 chronic kidney disease, or unspecified chronic kidney disease: Secondary | ICD-10-CM | POA: Diagnosis not present

## 2020-05-07 DIAGNOSIS — I69391 Dysphagia following cerebral infarction: Secondary | ICD-10-CM | POA: Diagnosis not present

## 2020-05-07 DIAGNOSIS — G1220 Motor neuron disease, unspecified: Secondary | ICD-10-CM | POA: Diagnosis not present

## 2020-05-07 DIAGNOSIS — R69 Illness, unspecified: Secondary | ICD-10-CM | POA: Diagnosis not present

## 2020-05-07 DIAGNOSIS — R1310 Dysphagia, unspecified: Secondary | ICD-10-CM | POA: Diagnosis not present

## 2020-05-07 DIAGNOSIS — I69354 Hemiplegia and hemiparesis following cerebral infarction affecting left non-dominant side: Secondary | ICD-10-CM | POA: Diagnosis not present

## 2020-05-07 DIAGNOSIS — M1990 Unspecified osteoarthritis, unspecified site: Secondary | ICD-10-CM | POA: Diagnosis not present

## 2020-05-07 DIAGNOSIS — N184 Chronic kidney disease, stage 4 (severe): Secondary | ICD-10-CM | POA: Diagnosis not present

## 2020-05-07 DIAGNOSIS — D631 Anemia in chronic kidney disease: Secondary | ICD-10-CM | POA: Diagnosis not present

## 2020-05-07 DIAGNOSIS — G40909 Epilepsy, unspecified, not intractable, without status epilepticus: Secondary | ICD-10-CM | POA: Diagnosis not present

## 2020-05-10 DIAGNOSIS — G1220 Motor neuron disease, unspecified: Secondary | ICD-10-CM | POA: Diagnosis not present

## 2020-05-10 DIAGNOSIS — I69354 Hemiplegia and hemiparesis following cerebral infarction affecting left non-dominant side: Secondary | ICD-10-CM | POA: Diagnosis not present

## 2020-05-10 DIAGNOSIS — R1310 Dysphagia, unspecified: Secondary | ICD-10-CM | POA: Diagnosis not present

## 2020-05-10 DIAGNOSIS — N184 Chronic kidney disease, stage 4 (severe): Secondary | ICD-10-CM | POA: Diagnosis not present

## 2020-05-10 DIAGNOSIS — M1990 Unspecified osteoarthritis, unspecified site: Secondary | ICD-10-CM | POA: Diagnosis not present

## 2020-05-10 DIAGNOSIS — D631 Anemia in chronic kidney disease: Secondary | ICD-10-CM | POA: Diagnosis not present

## 2020-05-10 DIAGNOSIS — I69391 Dysphagia following cerebral infarction: Secondary | ICD-10-CM | POA: Diagnosis not present

## 2020-05-10 DIAGNOSIS — G40909 Epilepsy, unspecified, not intractable, without status epilepticus: Secondary | ICD-10-CM | POA: Diagnosis not present

## 2020-05-10 DIAGNOSIS — I129 Hypertensive chronic kidney disease with stage 1 through stage 4 chronic kidney disease, or unspecified chronic kidney disease: Secondary | ICD-10-CM | POA: Diagnosis not present

## 2020-05-10 DIAGNOSIS — R69 Illness, unspecified: Secondary | ICD-10-CM | POA: Diagnosis not present

## 2020-05-11 DIAGNOSIS — I69391 Dysphagia following cerebral infarction: Secondary | ICD-10-CM | POA: Diagnosis not present

## 2020-05-11 DIAGNOSIS — G40909 Epilepsy, unspecified, not intractable, without status epilepticus: Secondary | ICD-10-CM | POA: Diagnosis not present

## 2020-05-11 DIAGNOSIS — M1990 Unspecified osteoarthritis, unspecified site: Secondary | ICD-10-CM | POA: Diagnosis not present

## 2020-05-11 DIAGNOSIS — I129 Hypertensive chronic kidney disease with stage 1 through stage 4 chronic kidney disease, or unspecified chronic kidney disease: Secondary | ICD-10-CM | POA: Diagnosis not present

## 2020-05-11 DIAGNOSIS — R69 Illness, unspecified: Secondary | ICD-10-CM | POA: Diagnosis not present

## 2020-05-11 DIAGNOSIS — N184 Chronic kidney disease, stage 4 (severe): Secondary | ICD-10-CM | POA: Diagnosis not present

## 2020-05-11 DIAGNOSIS — I69354 Hemiplegia and hemiparesis following cerebral infarction affecting left non-dominant side: Secondary | ICD-10-CM | POA: Diagnosis not present

## 2020-05-11 DIAGNOSIS — R1310 Dysphagia, unspecified: Secondary | ICD-10-CM | POA: Diagnosis not present

## 2020-05-11 DIAGNOSIS — G1220 Motor neuron disease, unspecified: Secondary | ICD-10-CM | POA: Diagnosis not present

## 2020-05-11 DIAGNOSIS — D631 Anemia in chronic kidney disease: Secondary | ICD-10-CM | POA: Diagnosis not present

## 2020-05-12 DIAGNOSIS — I69391 Dysphagia following cerebral infarction: Secondary | ICD-10-CM | POA: Diagnosis not present

## 2020-05-12 DIAGNOSIS — R69 Illness, unspecified: Secondary | ICD-10-CM | POA: Diagnosis not present

## 2020-05-12 DIAGNOSIS — I69354 Hemiplegia and hemiparesis following cerebral infarction affecting left non-dominant side: Secondary | ICD-10-CM | POA: Diagnosis not present

## 2020-05-12 DIAGNOSIS — R1310 Dysphagia, unspecified: Secondary | ICD-10-CM | POA: Diagnosis not present

## 2020-05-12 DIAGNOSIS — M1990 Unspecified osteoarthritis, unspecified site: Secondary | ICD-10-CM | POA: Diagnosis not present

## 2020-05-12 DIAGNOSIS — D631 Anemia in chronic kidney disease: Secondary | ICD-10-CM | POA: Diagnosis not present

## 2020-05-12 DIAGNOSIS — G40909 Epilepsy, unspecified, not intractable, without status epilepticus: Secondary | ICD-10-CM | POA: Diagnosis not present

## 2020-05-12 DIAGNOSIS — N184 Chronic kidney disease, stage 4 (severe): Secondary | ICD-10-CM | POA: Diagnosis not present

## 2020-05-12 DIAGNOSIS — G1220 Motor neuron disease, unspecified: Secondary | ICD-10-CM | POA: Diagnosis not present

## 2020-05-12 DIAGNOSIS — I129 Hypertensive chronic kidney disease with stage 1 through stage 4 chronic kidney disease, or unspecified chronic kidney disease: Secondary | ICD-10-CM | POA: Diagnosis not present

## 2020-05-13 DIAGNOSIS — I69354 Hemiplegia and hemiparesis following cerebral infarction affecting left non-dominant side: Secondary | ICD-10-CM | POA: Diagnosis not present

## 2020-05-13 DIAGNOSIS — G1220 Motor neuron disease, unspecified: Secondary | ICD-10-CM | POA: Diagnosis not present

## 2020-05-13 DIAGNOSIS — I69391 Dysphagia following cerebral infarction: Secondary | ICD-10-CM | POA: Diagnosis not present

## 2020-05-13 DIAGNOSIS — R1310 Dysphagia, unspecified: Secondary | ICD-10-CM | POA: Diagnosis not present

## 2020-05-13 DIAGNOSIS — M1990 Unspecified osteoarthritis, unspecified site: Secondary | ICD-10-CM | POA: Diagnosis not present

## 2020-05-13 DIAGNOSIS — I129 Hypertensive chronic kidney disease with stage 1 through stage 4 chronic kidney disease, or unspecified chronic kidney disease: Secondary | ICD-10-CM | POA: Diagnosis not present

## 2020-05-13 DIAGNOSIS — R69 Illness, unspecified: Secondary | ICD-10-CM | POA: Diagnosis not present

## 2020-05-13 DIAGNOSIS — G40909 Epilepsy, unspecified, not intractable, without status epilepticus: Secondary | ICD-10-CM | POA: Diagnosis not present

## 2020-05-13 DIAGNOSIS — D631 Anemia in chronic kidney disease: Secondary | ICD-10-CM | POA: Diagnosis not present

## 2020-05-13 DIAGNOSIS — N184 Chronic kidney disease, stage 4 (severe): Secondary | ICD-10-CM | POA: Diagnosis not present

## 2020-05-14 DIAGNOSIS — G1220 Motor neuron disease, unspecified: Secondary | ICD-10-CM | POA: Diagnosis not present

## 2020-05-17 ENCOUNTER — Telehealth: Payer: Self-pay | Admitting: Family Medicine

## 2020-05-17 NOTE — Telephone Encounter (Signed)
Tillie Rung was called and given verbal orders for patient.

## 2020-05-17 NOTE — Telephone Encounter (Signed)
Tillie Rung with [facility] requesting PT orders Frequency: 2w2 and 1w2  VM OK

## 2020-05-18 ENCOUNTER — Telehealth: Payer: Self-pay | Admitting: Family Medicine

## 2020-05-18 NOTE — Telephone Encounter (Signed)
Home Health Verbal Orders - Caller/Agency: [facility] Number: [phone] Requesting Social worker eval  Frequency:   Family wanted community resources

## 2020-05-19 DIAGNOSIS — D631 Anemia in chronic kidney disease: Secondary | ICD-10-CM | POA: Diagnosis not present

## 2020-05-19 DIAGNOSIS — I129 Hypertensive chronic kidney disease with stage 1 through stage 4 chronic kidney disease, or unspecified chronic kidney disease: Secondary | ICD-10-CM | POA: Diagnosis not present

## 2020-05-19 DIAGNOSIS — I69354 Hemiplegia and hemiparesis following cerebral infarction affecting left non-dominant side: Secondary | ICD-10-CM | POA: Diagnosis not present

## 2020-05-19 DIAGNOSIS — G1220 Motor neuron disease, unspecified: Secondary | ICD-10-CM | POA: Diagnosis not present

## 2020-05-19 DIAGNOSIS — R69 Illness, unspecified: Secondary | ICD-10-CM | POA: Diagnosis not present

## 2020-05-19 DIAGNOSIS — G40909 Epilepsy, unspecified, not intractable, without status epilepticus: Secondary | ICD-10-CM | POA: Diagnosis not present

## 2020-05-19 DIAGNOSIS — M1990 Unspecified osteoarthritis, unspecified site: Secondary | ICD-10-CM | POA: Diagnosis not present

## 2020-05-19 DIAGNOSIS — N184 Chronic kidney disease, stage 4 (severe): Secondary | ICD-10-CM | POA: Diagnosis not present

## 2020-05-19 DIAGNOSIS — I69391 Dysphagia following cerebral infarction: Secondary | ICD-10-CM | POA: Diagnosis not present

## 2020-05-19 DIAGNOSIS — R1310 Dysphagia, unspecified: Secondary | ICD-10-CM | POA: Diagnosis not present

## 2020-05-19 NOTE — Telephone Encounter (Signed)
Sharyn Lull was called and given verbal orders for social work for patient.

## 2020-05-20 ENCOUNTER — Telehealth: Payer: Self-pay | Admitting: Family Medicine

## 2020-05-20 DIAGNOSIS — R69 Illness, unspecified: Secondary | ICD-10-CM | POA: Diagnosis not present

## 2020-05-20 DIAGNOSIS — N184 Chronic kidney disease, stage 4 (severe): Secondary | ICD-10-CM | POA: Diagnosis not present

## 2020-05-20 DIAGNOSIS — I69354 Hemiplegia and hemiparesis following cerebral infarction affecting left non-dominant side: Secondary | ICD-10-CM | POA: Diagnosis not present

## 2020-05-20 DIAGNOSIS — G1220 Motor neuron disease, unspecified: Secondary | ICD-10-CM | POA: Diagnosis not present

## 2020-05-20 DIAGNOSIS — I69391 Dysphagia following cerebral infarction: Secondary | ICD-10-CM | POA: Diagnosis not present

## 2020-05-20 DIAGNOSIS — M1990 Unspecified osteoarthritis, unspecified site: Secondary | ICD-10-CM | POA: Diagnosis not present

## 2020-05-20 DIAGNOSIS — D631 Anemia in chronic kidney disease: Secondary | ICD-10-CM | POA: Diagnosis not present

## 2020-05-20 DIAGNOSIS — I129 Hypertensive chronic kidney disease with stage 1 through stage 4 chronic kidney disease, or unspecified chronic kidney disease: Secondary | ICD-10-CM | POA: Diagnosis not present

## 2020-05-20 DIAGNOSIS — R1310 Dysphagia, unspecified: Secondary | ICD-10-CM | POA: Diagnosis not present

## 2020-05-20 DIAGNOSIS — G40909 Epilepsy, unspecified, not intractable, without status epilepticus: Secondary | ICD-10-CM | POA: Diagnosis not present

## 2020-05-20 NOTE — Telephone Encounter (Signed)
[place]   Caller/Agency: [facility] Number: [phone] Requesting OT/PT/Skilled Nursing/Social Work/Speech Therapy: OT Frequency: 1x3    Also his wife has noticed when she was cleaning him up he had a little blood in rectal area. Estill Bamberg is wanting to know if he needed lab etc . Please reach out to [facility]

## 2020-05-21 DIAGNOSIS — M1990 Unspecified osteoarthritis, unspecified site: Secondary | ICD-10-CM | POA: Diagnosis not present

## 2020-05-21 DIAGNOSIS — I69391 Dysphagia following cerebral infarction: Secondary | ICD-10-CM | POA: Diagnosis not present

## 2020-05-21 DIAGNOSIS — G1220 Motor neuron disease, unspecified: Secondary | ICD-10-CM | POA: Diagnosis not present

## 2020-05-21 DIAGNOSIS — R69 Illness, unspecified: Secondary | ICD-10-CM | POA: Diagnosis not present

## 2020-05-21 DIAGNOSIS — R1310 Dysphagia, unspecified: Secondary | ICD-10-CM | POA: Diagnosis not present

## 2020-05-21 DIAGNOSIS — N184 Chronic kidney disease, stage 4 (severe): Secondary | ICD-10-CM | POA: Diagnosis not present

## 2020-05-21 DIAGNOSIS — D631 Anemia in chronic kidney disease: Secondary | ICD-10-CM | POA: Diagnosis not present

## 2020-05-21 DIAGNOSIS — G40909 Epilepsy, unspecified, not intractable, without status epilepticus: Secondary | ICD-10-CM | POA: Diagnosis not present

## 2020-05-21 DIAGNOSIS — I69354 Hemiplegia and hemiparesis following cerebral infarction affecting left non-dominant side: Secondary | ICD-10-CM | POA: Diagnosis not present

## 2020-05-21 DIAGNOSIS — I129 Hypertensive chronic kidney disease with stage 1 through stage 4 chronic kidney disease, or unspecified chronic kidney disease: Secondary | ICD-10-CM | POA: Diagnosis not present

## 2020-05-21 NOTE — Telephone Encounter (Signed)
Estill Bamberg was called and given verba orders for the patient.

## 2020-05-25 ENCOUNTER — Other Ambulatory Visit: Payer: Medicare HMO | Admitting: Internal Medicine

## 2020-05-25 DIAGNOSIS — R05 Cough: Secondary | ICD-10-CM | POA: Diagnosis not present

## 2020-05-25 DIAGNOSIS — J410 Simple chronic bronchitis: Secondary | ICD-10-CM | POA: Diagnosis not present

## 2020-05-25 DIAGNOSIS — J42 Unspecified chronic bronchitis: Secondary | ICD-10-CM | POA: Insufficient documentation

## 2020-05-25 DIAGNOSIS — Z122 Encounter for screening for malignant neoplasm of respiratory organs: Secondary | ICD-10-CM | POA: Diagnosis not present

## 2020-05-25 DIAGNOSIS — R053 Chronic cough: Secondary | ICD-10-CM | POA: Insufficient documentation

## 2020-05-25 DIAGNOSIS — Z720 Tobacco use: Secondary | ICD-10-CM | POA: Diagnosis not present

## 2020-05-25 NOTE — Telephone Encounter (Signed)
Copied from [place] [id number]. Topic: General - Other >> May 25, 2020 10:19 AM Lennox Solders wrote: Reason for CRM: [facility] did not get all the home health order,plan of care and physician orders. Deanna will refax the form

## 2020-05-25 NOTE — Telephone Encounter (Signed)
Form has been received and will be faxed once signed.

## 2020-05-26 DIAGNOSIS — I69354 Hemiplegia and hemiparesis following cerebral infarction affecting left non-dominant side: Secondary | ICD-10-CM | POA: Diagnosis not present

## 2020-05-26 DIAGNOSIS — R1310 Dysphagia, unspecified: Secondary | ICD-10-CM | POA: Diagnosis not present

## 2020-05-26 DIAGNOSIS — G40909 Epilepsy, unspecified, not intractable, without status epilepticus: Secondary | ICD-10-CM | POA: Diagnosis not present

## 2020-05-26 DIAGNOSIS — I69342 Monoplegia of lower limb following cerebral infarction affecting left dominant side: Secondary | ICD-10-CM | POA: Diagnosis not present

## 2020-05-26 DIAGNOSIS — R69 Illness, unspecified: Secondary | ICD-10-CM | POA: Diagnosis not present

## 2020-05-26 DIAGNOSIS — D631 Anemia in chronic kidney disease: Secondary | ICD-10-CM | POA: Diagnosis not present

## 2020-05-26 DIAGNOSIS — G1220 Motor neuron disease, unspecified: Secondary | ICD-10-CM | POA: Diagnosis not present

## 2020-05-26 DIAGNOSIS — I129 Hypertensive chronic kidney disease with stage 1 through stage 4 chronic kidney disease, or unspecified chronic kidney disease: Secondary | ICD-10-CM | POA: Diagnosis not present

## 2020-05-26 DIAGNOSIS — M1990 Unspecified osteoarthritis, unspecified site: Secondary | ICD-10-CM | POA: Diagnosis not present

## 2020-05-26 DIAGNOSIS — N184 Chronic kidney disease, stage 4 (severe): Secondary | ICD-10-CM | POA: Diagnosis not present

## 2020-05-26 DIAGNOSIS — I69391 Dysphagia following cerebral infarction: Secondary | ICD-10-CM | POA: Diagnosis not present

## 2020-05-28 DIAGNOSIS — D631 Anemia in chronic kidney disease: Secondary | ICD-10-CM | POA: Diagnosis not present

## 2020-05-28 DIAGNOSIS — N184 Chronic kidney disease, stage 4 (severe): Secondary | ICD-10-CM | POA: Diagnosis not present

## 2020-05-28 DIAGNOSIS — I69354 Hemiplegia and hemiparesis following cerebral infarction affecting left non-dominant side: Secondary | ICD-10-CM | POA: Diagnosis not present

## 2020-05-28 DIAGNOSIS — I69391 Dysphagia following cerebral infarction: Secondary | ICD-10-CM | POA: Diagnosis not present

## 2020-05-28 DIAGNOSIS — G40909 Epilepsy, unspecified, not intractable, without status epilepticus: Secondary | ICD-10-CM | POA: Diagnosis not present

## 2020-05-28 DIAGNOSIS — R1310 Dysphagia, unspecified: Secondary | ICD-10-CM | POA: Diagnosis not present

## 2020-05-28 DIAGNOSIS — I129 Hypertensive chronic kidney disease with stage 1 through stage 4 chronic kidney disease, or unspecified chronic kidney disease: Secondary | ICD-10-CM | POA: Diagnosis not present

## 2020-05-28 DIAGNOSIS — M1990 Unspecified osteoarthritis, unspecified site: Secondary | ICD-10-CM | POA: Diagnosis not present

## 2020-05-28 DIAGNOSIS — R69 Illness, unspecified: Secondary | ICD-10-CM | POA: Diagnosis not present

## 2020-05-28 DIAGNOSIS — G1220 Motor neuron disease, unspecified: Secondary | ICD-10-CM | POA: Diagnosis not present

## 2020-05-29 DIAGNOSIS — R05 Cough: Secondary | ICD-10-CM | POA: Diagnosis not present

## 2020-05-29 DIAGNOSIS — J3489 Other specified disorders of nose and nasal sinuses: Secondary | ICD-10-CM | POA: Diagnosis not present

## 2020-05-29 DIAGNOSIS — R197 Diarrhea, unspecified: Secondary | ICD-10-CM | POA: Diagnosis not present

## 2020-05-29 DIAGNOSIS — G1221 Amyotrophic lateral sclerosis: Secondary | ICD-10-CM | POA: Diagnosis not present

## 2020-06-01 ENCOUNTER — Other Ambulatory Visit: Payer: Self-pay

## 2020-06-01 ENCOUNTER — Telehealth: Payer: Self-pay | Admitting: Family Medicine

## 2020-06-01 DIAGNOSIS — G1220 Motor neuron disease, unspecified: Secondary | ICD-10-CM | POA: Diagnosis not present

## 2020-06-01 DIAGNOSIS — D631 Anemia in chronic kidney disease: Secondary | ICD-10-CM | POA: Diagnosis not present

## 2020-06-01 DIAGNOSIS — I129 Hypertensive chronic kidney disease with stage 1 through stage 4 chronic kidney disease, or unspecified chronic kidney disease: Secondary | ICD-10-CM | POA: Diagnosis not present

## 2020-06-01 DIAGNOSIS — I69391 Dysphagia following cerebral infarction: Secondary | ICD-10-CM | POA: Diagnosis not present

## 2020-06-01 DIAGNOSIS — I69354 Hemiplegia and hemiparesis following cerebral infarction affecting left non-dominant side: Secondary | ICD-10-CM | POA: Diagnosis not present

## 2020-06-01 DIAGNOSIS — N184 Chronic kidney disease, stage 4 (severe): Secondary | ICD-10-CM | POA: Diagnosis not present

## 2020-06-01 DIAGNOSIS — R69 Illness, unspecified: Secondary | ICD-10-CM | POA: Diagnosis not present

## 2020-06-01 DIAGNOSIS — R1310 Dysphagia, unspecified: Secondary | ICD-10-CM | POA: Diagnosis not present

## 2020-06-01 DIAGNOSIS — G40909 Epilepsy, unspecified, not intractable, without status epilepticus: Secondary | ICD-10-CM | POA: Diagnosis not present

## 2020-06-01 DIAGNOSIS — M1990 Unspecified osteoarthritis, unspecified site: Secondary | ICD-10-CM | POA: Diagnosis not present

## 2020-06-01 MED ORDER — MISC. DEVICES MISC: 0 refills | Status: DC

## 2020-06-01 NOTE — Telephone Encounter (Signed)
Signed.

## 2020-06-01 NOTE — Telephone Encounter (Signed)
Please advise.   Copied from [place] [phone]. Topic: General - Call Back - No Documentation >> Jun 01, 2020 12:45 PM Erick Blinks wrote: Lysbeth Galas from [facility] sent a request for an extra long hospital bed and they have yet to receive anything back. Please advise   Best contact: [phone]  Most recent was august 26th faxed over around 2pm.

## 2020-06-01 NOTE — Telephone Encounter (Signed)
Script has been printed and awaiting signature.

## 2020-06-03 DIAGNOSIS — I129 Hypertensive chronic kidney disease with stage 1 through stage 4 chronic kidney disease, or unspecified chronic kidney disease: Secondary | ICD-10-CM | POA: Diagnosis not present

## 2020-06-03 DIAGNOSIS — N184 Chronic kidney disease, stage 4 (severe): Secondary | ICD-10-CM | POA: Diagnosis not present

## 2020-06-03 DIAGNOSIS — R1310 Dysphagia, unspecified: Secondary | ICD-10-CM | POA: Diagnosis not present

## 2020-06-03 DIAGNOSIS — I69354 Hemiplegia and hemiparesis following cerebral infarction affecting left non-dominant side: Secondary | ICD-10-CM | POA: Diagnosis not present

## 2020-06-03 DIAGNOSIS — R69 Illness, unspecified: Secondary | ICD-10-CM | POA: Diagnosis not present

## 2020-06-03 DIAGNOSIS — D631 Anemia in chronic kidney disease: Secondary | ICD-10-CM | POA: Diagnosis not present

## 2020-06-03 DIAGNOSIS — M1990 Unspecified osteoarthritis, unspecified site: Secondary | ICD-10-CM | POA: Diagnosis not present

## 2020-06-03 DIAGNOSIS — I69391 Dysphagia following cerebral infarction: Secondary | ICD-10-CM | POA: Diagnosis not present

## 2020-06-03 DIAGNOSIS — G40909 Epilepsy, unspecified, not intractable, without status epilepticus: Secondary | ICD-10-CM | POA: Diagnosis not present

## 2020-06-03 DIAGNOSIS — G1220 Motor neuron disease, unspecified: Secondary | ICD-10-CM | POA: Diagnosis not present

## 2020-06-04 DIAGNOSIS — R69 Illness, unspecified: Secondary | ICD-10-CM | POA: Diagnosis not present

## 2020-06-04 DIAGNOSIS — M1990 Unspecified osteoarthritis, unspecified site: Secondary | ICD-10-CM | POA: Diagnosis not present

## 2020-06-04 DIAGNOSIS — D631 Anemia in chronic kidney disease: Secondary | ICD-10-CM | POA: Diagnosis not present

## 2020-06-04 DIAGNOSIS — I129 Hypertensive chronic kidney disease with stage 1 through stage 4 chronic kidney disease, or unspecified chronic kidney disease: Secondary | ICD-10-CM | POA: Diagnosis not present

## 2020-06-04 DIAGNOSIS — R1310 Dysphagia, unspecified: Secondary | ICD-10-CM | POA: Diagnosis not present

## 2020-06-04 DIAGNOSIS — I69354 Hemiplegia and hemiparesis following cerebral infarction affecting left non-dominant side: Secondary | ICD-10-CM | POA: Diagnosis not present

## 2020-06-04 DIAGNOSIS — G1220 Motor neuron disease, unspecified: Secondary | ICD-10-CM | POA: Diagnosis not present

## 2020-06-04 DIAGNOSIS — I69391 Dysphagia following cerebral infarction: Secondary | ICD-10-CM | POA: Diagnosis not present

## 2020-06-04 DIAGNOSIS — G40909 Epilepsy, unspecified, not intractable, without status epilepticus: Secondary | ICD-10-CM | POA: Diagnosis not present

## 2020-06-04 DIAGNOSIS — N184 Chronic kidney disease, stage 4 (severe): Secondary | ICD-10-CM | POA: Diagnosis not present

## 2020-06-07 ENCOUNTER — Telehealth: Payer: Self-pay | Admitting: Family Medicine

## 2020-06-07 NOTE — Telephone Encounter (Signed)
Will route to PCP for review.  Extra long hospital bed order was faxed last week.

## 2020-06-07 NOTE — Telephone Encounter (Signed)
Copied from [place]. Topic: Referral - Status >> Jun 07, 2020 10:55 AM Yvette Rack wrote: Reason for CRM: Pt wife requests a referral for an extension for pt bed. Pt wife requests that the referral request be sent to [facility] ph# [phone]

## 2020-06-08 DIAGNOSIS — G40909 Epilepsy, unspecified, not intractable, without status epilepticus: Secondary | ICD-10-CM | POA: Diagnosis not present

## 2020-06-08 DIAGNOSIS — D631 Anemia in chronic kidney disease: Secondary | ICD-10-CM | POA: Diagnosis not present

## 2020-06-08 DIAGNOSIS — R1310 Dysphagia, unspecified: Secondary | ICD-10-CM | POA: Diagnosis not present

## 2020-06-08 DIAGNOSIS — G1220 Motor neuron disease, unspecified: Secondary | ICD-10-CM | POA: Diagnosis not present

## 2020-06-08 DIAGNOSIS — I129 Hypertensive chronic kidney disease with stage 1 through stage 4 chronic kidney disease, or unspecified chronic kidney disease: Secondary | ICD-10-CM | POA: Diagnosis not present

## 2020-06-08 DIAGNOSIS — R69 Illness, unspecified: Secondary | ICD-10-CM | POA: Diagnosis not present

## 2020-06-08 DIAGNOSIS — I69354 Hemiplegia and hemiparesis following cerebral infarction affecting left non-dominant side: Secondary | ICD-10-CM | POA: Diagnosis not present

## 2020-06-08 DIAGNOSIS — N184 Chronic kidney disease, stage 4 (severe): Secondary | ICD-10-CM | POA: Diagnosis not present

## 2020-06-08 DIAGNOSIS — M1990 Unspecified osteoarthritis, unspecified site: Secondary | ICD-10-CM | POA: Diagnosis not present

## 2020-06-08 DIAGNOSIS — I69391 Dysphagia following cerebral infarction: Secondary | ICD-10-CM | POA: Diagnosis not present

## 2020-06-08 NOTE — Telephone Encounter (Signed)
Copied from [place] [phone]. Topic: General - Other >> Jun 08, 2020  1:44 PM Rainey Pines A wrote:  Patients spouse would like a callback from [facility] in regards to patients hospital bed order and the bed being electric. Please advise

## 2020-06-09 DIAGNOSIS — R69 Illness, unspecified: Secondary | ICD-10-CM | POA: Diagnosis not present

## 2020-06-09 DIAGNOSIS — G40909 Epilepsy, unspecified, not intractable, without status epilepticus: Secondary | ICD-10-CM | POA: Diagnosis not present

## 2020-06-09 DIAGNOSIS — M1990 Unspecified osteoarthritis, unspecified site: Secondary | ICD-10-CM | POA: Diagnosis not present

## 2020-06-09 DIAGNOSIS — D631 Anemia in chronic kidney disease: Secondary | ICD-10-CM | POA: Diagnosis not present

## 2020-06-09 DIAGNOSIS — G1220 Motor neuron disease, unspecified: Secondary | ICD-10-CM | POA: Diagnosis not present

## 2020-06-09 DIAGNOSIS — I69391 Dysphagia following cerebral infarction: Secondary | ICD-10-CM | POA: Diagnosis not present

## 2020-06-09 DIAGNOSIS — I129 Hypertensive chronic kidney disease with stage 1 through stage 4 chronic kidney disease, or unspecified chronic kidney disease: Secondary | ICD-10-CM | POA: Diagnosis not present

## 2020-06-09 DIAGNOSIS — I69354 Hemiplegia and hemiparesis following cerebral infarction affecting left non-dominant side: Secondary | ICD-10-CM | POA: Diagnosis not present

## 2020-06-09 DIAGNOSIS — N184 Chronic kidney disease, stage 4 (severe): Secondary | ICD-10-CM | POA: Diagnosis not present

## 2020-06-09 DIAGNOSIS — R1310 Dysphagia, unspecified: Secondary | ICD-10-CM | POA: Diagnosis not present

## 2020-06-09 NOTE — Telephone Encounter (Signed)
Copied from [place] [id number]. Topic: General - Other >> Jun 08, 2020  4:27 PM Rainey Pines A wrote: Patients spouse Mrs. Bihm returned [employer] and would like her to callback today before close. Please advise

## 2020-06-09 NOTE — Telephone Encounter (Signed)
Patient's wife was called and informed that Medicare will not cover a full electric bed, she was informed that they will cover a extension for his current bed.He will need to have office notes for this to be covered.  He has been set up for a virtual appointment on 06/10/20

## 2020-06-09 NOTE — Telephone Encounter (Signed)
Pt spouse returned call to [facility] regarding pt request for Electric hospital bed.

## 2020-06-10 ENCOUNTER — Encounter: Payer: Self-pay | Admitting: Family Medicine

## 2020-06-10 ENCOUNTER — Ambulatory Visit: Payer: Medicare HMO | Attending: Family Medicine | Admitting: Family Medicine

## 2020-06-10 DIAGNOSIS — G1220 Motor neuron disease, unspecified: Secondary | ICD-10-CM

## 2020-06-10 MED ORDER — MISC. DEVICES MISC: 0 refills | Status: DC

## 2020-06-10 NOTE — Progress Notes (Signed)
Needs office notes so that he can get his hospital bed extension.

## 2020-06-10 NOTE — Progress Notes (Signed)
Virtual Visit via Telephone Note  I connected with [name], on 06/10/2020 at 9:42 AM by telephone due to the COVID-19 pandemic and verified that I am speaking with the correct person using two identifiers.   Consent: I discussed the limitations, risks, security and privacy concerns of performing an evaluation and management service by telephone and the availability of in person appointments. I also discussed with the patient that there may be a patient responsible charge related to this service. The patient expressed understanding and agreed to proceed.   Location of Patient: Home  Location of Provider: Clinic   Persons participating in Telemedicine visit: Fadel Clason Farrington-CMA Dr. Margarita Rana     History of Present Illness: BERNERD TERHUNE is a 69 year old male with a history of hypertension, previous CVA with residual left-sided weakness, seizure in the setting of prior CVA, second degree AV block (status post medtronic dual chamber pacemaker) stage III CKD, motor neuron disease/ALS who is seen todayfor an acute visit.  The DME company is requesting a visit to justify his need for hospital bed extension. He needs an extension to his current hospital bed due to the fact that his height is 6 feet 4 inches and he currently slides down to the foot of the bed and has to be pushed back up several times by his spouse.  He does have a history of ALS and has been going secretions and needs to have a hospital bed. History is obtained from his spouse.  Past Medical History:  Diagnosis Date  . Arthritis   . At high risk for falls 08/16/2015  . Bradycardia   . Chronic kidney disease    stage 3 GFR 30-59 ml/min   . CIDP (chronic inflammatory demyelinating polyneuropathy) ([place])   . CKD (chronic kidney disease) stage 3, GFR 30-59 ml/min 08/16/2015  . Dysphagia as late effect of cerebrovascular disease    pts wife states pt has to eat soft foods   . Elevated liver enzymes  08/10/2016  . GERD (gastroesophageal reflux disease)   . Glaucoma   . High cholesterol   . History of CVA with residual deficit 03/25/2013  . Hypertension   . Hypertensive retinopathy of both eyes 01/16/2017  . Inguinal hernia 03/25/2013  . Liver hemangioma 08/14/2016  . Neuromuscular disorder ([place])    chronic inflammatory demyelinating polyneuropathy   . New onset seizure ([place]) 07/08/2017   seizure 07/14/18  . Nuclear sclerosis of both eyes 10/25/2016  . Presence of permanent cardiac pacemaker    placed in april 2018  . Primary open angle glaucoma of both eyes, indeterminate stage 10/25/2016  . Renal mass, right 08/14/2016  . Status cardiac pacemaker 01/29/2017   Placed for second degree heart block on 01/16/17 Medtronic Azure XT DR MRI SureScan dual-chamber pacemaker  . Stroke [facility])    2011 with residual deficit left sided weakness  . Tobacco dependence    Allergies  Allergen Reactions  . Ace Inhibitors Other (See Comments)    Hyperkalemia  . Doxycycline Other (See Comments)    Hiccups, cough, nausea and emesis, elevated liver enzymes, elevated eosinophils, SOB concerning for early DRESS syndrome   . Atacand Hct [Candesartan Cilexetil-Hctz] Hives  . Shellfish Allergy Hives    Current Outpatient Medications on File Prior to Visit  Medication Sig Dispense Refill  . aspirin EC 81 MG tablet Take 1 tablet (81 mg total) by mouth daily. 90 tablet 3  . atorvastatin (LIPITOR) 40 MG tablet Take 1 tablet (40  mg total) by mouth daily. 90 tablet 1  . brimonidine (ALPHAGAN P) 0.1 % SOLN Place 1 drop into both eyes daily.     . cetirizine (ZYRTEC) 10 MG tablet Take 1 tablet (10 mg total) by mouth daily. 90 tablet 1  . dorzolamide-timolol (COSOPT) 22.3-6.8 MG/ML ophthalmic solution Place 1 drop into both eyes daily.     . ferrous sulfate 325 (65 FE) MG tablet Take 1 tablet (325 mg total) by mouth 2 (two) times daily with a meal. 60 tablet 3  . fluticasone (FLONASE) 50 MCG/ACT nasal spray Place 2  sprays into both nostrils daily. 16 g 3  . guaiFENesin-dextromethorphan (ROBITUSSIN DM) 100-10 MG/5ML syrup Take 5 mLs by mouth every 6 (six) hours as needed for cough. 236 mL 0  . latanoprost (XALATAN) 0.005 % ophthalmic solution Place 1 drop into both eyes at bedtime.    . levETIRAcetam (KEPPRA) 500 MG tablet Take 1 tablet (500 mg total) by mouth 2 (two) times daily. 180 tablet 4  . metoprolol tartrate (LOPRESSOR) 50 MG tablet Take 1 tablet (50 mg total) by mouth 2 (two) times daily. 180 tablet 3  . Misc. Devices MISC Yankauer suction. Dx: ALS 1 each 0  . Misc. Devices MISC Coughalator 1 each 0  . Misc. Devices MISC Nebulizer machine.  Diagnosis- Motor neuron disease 1 each 0  . Misc. Devices [place] Hospital Bed DXg12.20 1 each 0  . Multiple Vitamins-Iron (DAILY VITAMIN FORMULA+IRON) TABS Take 1 tablet by mouth daily.    . tamsulosin (FLOMAX) 0.4 MG CAPS capsule Take 1 capsule (0.4 mg total) by mouth daily. 90 capsule 1   No current facility-administered medications on file prior to visit.    Observations/Objective: Awake, alert, oriented x3 Not in acute distress  Assessment and Plan: 1. MND (motor neurone disease) [facility]) Prescription for hospital bed extension has been provided Will speak with case manager regarding resources for support for spouse who is the major caregiver. - Misc. Devices MISC; Hospital bed extension. Diagnosis: ALS. Height 6'4"  Dispense: 1 each; Refill: 0   Follow Up Instructions: Keep previously scheduled appointment.   I discussed the assessment and treatment plan with the patient. The patient was provided an opportunity to ask questions and all were answered. The patient agreed with the plan and demonstrated an understanding of the instructions.   The patient was advised to call back or seek an in-person evaluation if the symptoms worsen or if the condition fails to improve as anticipated.     I provided 11 minutes total of non-face-to-face  time during this encounter including median intraservice time, reviewing previous notes, investigations, ordering medications, medical decision making, coordinating care and patient verbalized understanding at the end of the visit.     [name], MD, FAAFP. [facility] and [place], [place]   06/10/2020, 9:42 AM

## 2020-06-14 DIAGNOSIS — G1220 Motor neuron disease, unspecified: Secondary | ICD-10-CM | POA: Diagnosis not present

## 2020-06-15 ENCOUNTER — Other Ambulatory Visit: Payer: Self-pay | Admitting: Nurse Practitioner

## 2020-06-15 ENCOUNTER — Telehealth: Payer: Self-pay

## 2020-06-15 DIAGNOSIS — N184 Chronic kidney disease, stage 4 (severe): Secondary | ICD-10-CM

## 2020-06-15 NOTE — Telephone Encounter (Signed)
Call placed to patient's wife # [phone] to discuss possible options for assistance for her husband.  Message left with call back requested to this CM.

## 2020-06-22 ENCOUNTER — Ambulatory Visit (INDEPENDENT_AMBULATORY_CARE_PROVIDER_SITE_OTHER): Payer: Medicare HMO | Admitting: Emergency Medicine

## 2020-06-22 DIAGNOSIS — I459 Conduction disorder, unspecified: Secondary | ICD-10-CM | POA: Diagnosis not present

## 2020-06-22 LAB — CUP PACEART REMOTE DEVICE CHECK
Battery Remaining Longevity: 100 mo
Battery Voltage: 2.98 V
Brady Statistic AP VP Percent: 92.58 %
Brady Statistic AP VS Percent: 0.98 %
Brady Statistic AS VP Percent: 2 %
Brady Statistic AS VS Percent: 4.44 %
Brady Statistic RA Percent Paced: 94.65 %
Brady Statistic RV Percent Paced: 94.58 %
Date Time Interrogation Session: 20210928021252
Implantable Lead Implant Date: 20180424
Implantable Lead Implant Date: 20180424
Implantable Lead Location: 753859
Implantable Lead Location: 753860
Implantable Lead Model: 5076
Implantable Lead Model: 5076
Implantable Pulse Generator Implant Date: 20180424
Lead Channel Impedance Value: 285 Ohm
Lead Channel Impedance Value: 323 Ohm
Lead Channel Impedance Value: 380 Ohm
Lead Channel Impedance Value: 399 Ohm
Lead Channel Pacing Threshold Amplitude: 0.5 V
Lead Channel Pacing Threshold Amplitude: 0.625 V
Lead Channel Pacing Threshold Pulse Width: 0.4 ms
Lead Channel Pacing Threshold Pulse Width: 0.4 ms
Lead Channel Sensing Intrinsic Amplitude: 1.5 mV
Lead Channel Sensing Intrinsic Amplitude: 1.5 mV
Lead Channel Sensing Intrinsic Amplitude: 7.5 mV
Lead Channel Sensing Intrinsic Amplitude: 7.5 mV
Lead Channel Setting Pacing Amplitude: 1.5 V
Lead Channel Setting Pacing Amplitude: 2.5 V
Lead Channel Setting Pacing Pulse Width: 0.4 ms
Lead Channel Setting Sensing Sensitivity: 2 mV

## 2020-06-23 NOTE — Telephone Encounter (Signed)
Call pt wife back re husband. Did not get message and getting desperate for some help with husband's bed. [phone]

## 2020-06-24 NOTE — Progress Notes (Signed)
Remote pacemaker transmission.   

## 2020-06-24 NOTE — Telephone Encounter (Signed)
Call returned to patient's wife, [name] # [phone], message left with call back requested to this CM

## 2020-06-25 DIAGNOSIS — I69391 Dysphagia following cerebral infarction: Secondary | ICD-10-CM | POA: Diagnosis not present

## 2020-06-25 DIAGNOSIS — I69342 Monoplegia of lower limb following cerebral infarction affecting left dominant side: Secondary | ICD-10-CM | POA: Diagnosis not present

## 2020-06-28 DIAGNOSIS — R197 Diarrhea, unspecified: Secondary | ICD-10-CM | POA: Diagnosis not present

## 2020-06-28 DIAGNOSIS — G1221 Amyotrophic lateral sclerosis: Secondary | ICD-10-CM | POA: Diagnosis not present

## 2020-06-28 DIAGNOSIS — J3489 Other specified disorders of nose and nasal sinuses: Secondary | ICD-10-CM | POA: Diagnosis not present

## 2020-06-28 DIAGNOSIS — R059 Cough, unspecified: Secondary | ICD-10-CM | POA: Diagnosis not present

## 2020-06-29 ENCOUNTER — Ambulatory Visit: Payer: Medicare HMO | Admitting: Podiatry

## 2020-07-13 ENCOUNTER — Other Ambulatory Visit: Payer: Self-pay

## 2020-07-13 ENCOUNTER — Ambulatory Visit: Payer: Medicare HMO | Admitting: Podiatrist

## 2020-07-13 ENCOUNTER — Encounter: Payer: Self-pay | Admitting: Podiatrist

## 2020-07-13 DIAGNOSIS — M79674 Pain in right toe(s): Secondary | ICD-10-CM

## 2020-07-13 DIAGNOSIS — M79675 Pain in left toe(s): Secondary | ICD-10-CM

## 2020-07-13 DIAGNOSIS — B351 Tinea unguium: Secondary | ICD-10-CM | POA: Diagnosis not present

## 2020-07-14 DIAGNOSIS — G1220 Motor neuron disease, unspecified: Secondary | ICD-10-CM | POA: Diagnosis not present

## 2020-07-15 NOTE — Progress Notes (Signed)
Chief Complaint  Patient presents with  . Nail Problem    trim     HPI: Patient is 69 y.o. male who presents today for continued foot and nail care.  Patient relates the nails become long and thick and are painful with shoe gear and ambulation.  Patient Active Problem List   Diagnosis Date Noted  . Goals of care, counseling/discussion   . Palliative care by specialist   . ARF (acute renal failure) ([place]) 05/05/2019  . Pain due to onychomycosis of toenails of both feet 03/25/2019  . 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[place]) 07/24/2018  . Dysarthria 05/21/2018  . Dysphagia 05/21/2018  . Gait abnormality 05/21/2018  . Spasticity 05/21/2018  . GERD (gastroesophageal reflux disease) 07/21/2017  . Hyperlipidemia 07/21/2017  . Bradycardia   . Seizures ([place]) 07/08/2017  . Status cardiac pacemaker 01/29/2017  . Unintended weight loss 01/29/2017  . Tinea pedis of right foot 01/29/2017  . Hypertensive retinopathy of both eyes 01/16/2017  . Hypercalcemia 01/15/2017  . Heart block 01/15/2017  . Nuclear sclerosis of both eyes 10/25/2016  . Primary open angle glaucoma of both eyes, indeterminate stage 10/25/2016  . Glaucoma 09/12/2016  . Liver hemangioma 08/14/2016  . Renal cyst 08/14/2016  . Renal mass, right 08/14/2016  . Elevated liver enzymes 08/10/2016  . CKD (chronic kidney disease) stage 3, GFR 30-59 ml/min ([name]) 08/16/2015  . At high risk for falls 08/16/2015  . Subcutaneous nodules 08/16/2015  . Prediabetes 10/07/2013  . Inguinal hernia 03/25/2013  . History of CVA with residual deficit 03/25/2013  . HTN (hypertension) 10/09/2012  . Tobacco abuse 10/09/2012    Current Outpatient Medications on File Prior to Visit  Medication Sig Dispense Refill  . aspirin EC 81 MG tablet Take 1 tablet (81 mg total) by mouth daily. 90 tablet 3  . atorvastatin (LIPITOR) 40 MG tablet Take 1 tablet (40 mg total) by mouth daily. 90 tablet 1  . brimonidine (ALPHAGAN P) 0.1 % SOLN Place 1 drop into both  eyes daily.     . cetirizine (ZYRTEC) 10 MG tablet Take 1 tablet (10 mg total) by mouth daily. 90 tablet 1  . dorzolamide-timolol (COSOPT) 22.3-6.8 MG/ML ophthalmic solution Place 1 drop into both eyes daily.     . Ferrous Sulfate (IRON) 325 (65 Fe) MG TABS TAKE 1 TABLET BY MOUTH TWICE DAILY WITH MEALS 60 tablet 0  . fluticasone (FLONASE) 50 MCG/ACT nasal spray Place 2 sprays into both nostrils daily. 16 g 3  . guaiFENesin-dextromethorphan (ROBITUSSIN DM) 100-10 MG/5ML syrup Take 5 mLs by mouth every 6 (six) hours as needed for cough. 236 mL 0  . latanoprost (XALATAN) 0.005 % ophthalmic solution Place 1 drop into both eyes at bedtime.    . levETIRAcetam (KEPPRA) 500 MG tablet Take 1 tablet (500 mg total) by mouth 2 (two) times daily. 180 tablet 4  . metoprolol tartrate (LOPRESSOR) 50 MG tablet Take 1 tablet (50 mg total) by mouth 2 (two) times daily. 180 tablet 3  . Misc. Devices MISC Yankauer suction. Dx: ALS 1 each 0  . Misc. Devices MISC Coughalator 1 each 0  . Misc. Devices MISC Nebulizer machine.  Diagnosis- Motor neuron disease 1 each 0  . Misc. Devices [place] Hospital Bed DXg12.20 1 each 0  . Misc. [place] [facility] bed extension. Diagnosis: ALS. Height 6'4" 1 each 0  . Multiple Vitamins-Iron (DAILY VITAMIN FORMULA+IRON) TABS Take 1 tablet by mouth daily.    . tamsulosin (FLOMAX) 0.4 MG CAPS  capsule Take 1 capsule (0.4 mg total) by mouth daily. 90 capsule 1   No current facility-administered medications on file prior to visit.    Allergies  Allergen Reactions  . Ace Inhibitors Other (See Comments)    Hyperkalemia  . Doxycycline Other (See Comments)    Hiccups, cough, nausea and emesis, elevated liver enzymes, elevated eosinophils, SOB concerning for early DRESS syndrome   . Atacand Hct [Candesartan Cilexetil-Hctz] Hives  . Shellfish Allergy Hives    Review of Systems No fevers, chills, nausea, muscle aches, no difficulty breathing, no calf pain, no chest pain or  shortness of breath.   Physical Exam  GENERAL APPEARANCE: Alert, conversant. Appropriately groomed. No acute distress.   VASCULAR: Pedal pulses palpable DP and PT bilateral.  Capillary refill time is immediate to all digits,  Proximal to distal cooling it warm to warm.  Digital perfusion adequate.   NEUROLOGIC: sensation is intact to 5.07 monofilament at 5/5 sites bilateral.  Light touch is intact bilateral, vibratory sensation intact bilateral  MUSCULOSKELETAL: acceptable muscle strength, tone and stability bilateral.  Hallux abductovalgus deformity noted bilateral hammertoe deformity 2 through 5 bilateral noted.  No pain, crepitus or limitation noted with foot and ankle range of motion bilateral.   DERMATOLOGIC: skin is warm, supple, and dry.  No open lesions noted.  No rash, no pre ulcerative lesions.  Digital nails are thick, discolored, dystrophic, brittle with subungual debris present and clinically mycotic x 10.      Assessment    ICD-10-CM   1. Pain due to onychomycosis of toenails of both feet  B35.1    M79.675    M79.674      Plan  Debridement of toenails was recommended.  Onychoreduction of symptomatic toenails was performed via nail nipper and power burr without iatrogenic incident.  Patient was instructed on signs and symptoms of infection and was told to call immediately should any of these arise.  He will return to the office for follow-up in 3 months and will call sooner if any problems arise.

## 2020-07-16 ENCOUNTER — Telehealth: Payer: Self-pay | Admitting: Family Medicine

## 2020-07-16 NOTE — Telephone Encounter (Signed)
Copied from [place] [id number]. Topic: General - Other >> Jul 05, 2020 12:01 PM Yvette Rack wrote: Reason for CRM: Pt wife [name] stated she received a voicemail message asking her to call the office.

## 2020-07-19 DIAGNOSIS — I69342 Monoplegia of lower limb following cerebral infarction affecting left dominant side: Secondary | ICD-10-CM | POA: Diagnosis not present

## 2020-07-19 DIAGNOSIS — I69391 Dysphagia following cerebral infarction: Secondary | ICD-10-CM | POA: Diagnosis not present

## 2020-07-20 NOTE — Telephone Encounter (Signed)
Call placed to patient's wife [name] # [phone] regarding patient's care needs.  Message left with call back requested to this CM

## 2020-07-29 DIAGNOSIS — R197 Diarrhea, unspecified: Secondary | ICD-10-CM | POA: Diagnosis not present

## 2020-07-29 DIAGNOSIS — R059 Cough, unspecified: Secondary | ICD-10-CM | POA: Diagnosis not present

## 2020-07-29 DIAGNOSIS — G1221 Amyotrophic lateral sclerosis: Secondary | ICD-10-CM | POA: Diagnosis not present

## 2020-07-29 DIAGNOSIS — J3489 Other specified disorders of nose and nasal sinuses: Secondary | ICD-10-CM | POA: Diagnosis not present

## 2020-07-31 DIAGNOSIS — R69 Illness, unspecified: Secondary | ICD-10-CM | POA: Diagnosis not present

## 2020-08-02 ENCOUNTER — Ambulatory Visit: Payer: Medicare HMO | Attending: Family Medicine | Admitting: Family Medicine

## 2020-08-02 ENCOUNTER — Other Ambulatory Visit: Payer: Self-pay

## 2020-08-02 ENCOUNTER — Telehealth: Payer: Self-pay | Admitting: Family Medicine

## 2020-08-02 DIAGNOSIS — G1220 Motor neuron disease, unspecified: Secondary | ICD-10-CM | POA: Diagnosis not present

## 2020-08-02 DIAGNOSIS — H918X9 Other specified hearing loss, unspecified ear: Secondary | ICD-10-CM | POA: Diagnosis not present

## 2020-08-02 DIAGNOSIS — R195 Other fecal abnormalities: Secondary | ICD-10-CM

## 2020-08-02 DIAGNOSIS — I69354 Hemiplegia and hemiparesis following cerebral infarction affecting left non-dominant side: Secondary | ICD-10-CM | POA: Diagnosis not present

## 2020-08-02 DIAGNOSIS — Z1211 Encounter for screening for malignant neoplasm of colon: Secondary | ICD-10-CM

## 2020-08-02 DIAGNOSIS — G4709 Other insomnia: Secondary | ICD-10-CM | POA: Diagnosis not present

## 2020-08-02 DIAGNOSIS — R197 Diarrhea, unspecified: Secondary | ICD-10-CM | POA: Diagnosis not present

## 2020-08-02 MED ORDER — TAMSULOSIN HCL 0.4 MG PO CAPS
0.4000 mg | ORAL_CAPSULE | Freq: Every day | ORAL | 1 refills | Status: DC
Start: 2020-08-02 — End: 2021-03-25

## 2020-08-02 MED ORDER — DICYCLOMINE HCL 10 MG PO CAPS: 10.0000 mg | ORAL_CAPSULE | Freq: Two times a day (BID) | ORAL | 3 refills | Status: DC

## 2020-08-02 MED ORDER — HYDROXYZINE HCL 25 MG PO TABS: 25.0000 mg | ORAL_TABLET | Freq: Every day | ORAL | 1 refills | Status: DC

## 2020-08-02 MED ORDER — ATORVASTATIN CALCIUM 40 MG PO TABS: 40.0000 mg | ORAL_TABLET | Freq: Every day | ORAL | 1 refills | Status: DC

## 2020-08-02 NOTE — Progress Notes (Signed)
Virtual Visit via Telephone Note  I connected with Darrell [name], on 08/02/2020 at 1:31 PM by telephone due to the COVID-19 pandemic and verified that I am speaking with the correct person using two identifiers.   Consent: I discussed the limitations, risks, security and privacy concerns of performing an evaluation and management service by telephone and the availability of in person appointments. I also discussed with the patient that there may be a patient responsible charge related to this service. The patient expressed understanding and agreed to proceed.   Location of Patient: Home  Location of Provider: Clinic   Persons participating in Telemedicine visit: Kenshin Splawn Farrington-CMA Dr. Margarita Rana     History of Present Illness: Darrell [name] a 69 year old male with a history of hypertension, previous CVA with residual left-sided weakness, seizure in the setting of prior CVA, second degree AV block (status post medtronic dual chamber pacemaker) stage III CKD, motor neuron disease/ALS who is seen todayfor chronic disease management. Spouse complains he has had insomnia. He falls asleep normally but has trouble staying aseep She has noticed he gets anxious at bedtime.  He does take daytime naps.  He also has had diarrhea about 3-4 x/times and stool sometimes has blood in it. He has experienced this in the past and had endorsed using Pepto-Bismol but today states they have not been using Pepto-Bismol; colorectal screening stool test ordered however it was initially canceled and his spouse complained the kit sent to them from [facility] was too complicated to use.  He is high risk and unfortunately will be unable to undergo colonoscopy.  He had home care in the past but this has stopped and his spouse is his major caregiver. He states he has lost his hearing in his R ear and I have advised him to use ceruminolytic's and if symptoms persist he will need an office visit for  evaluation for the presence of cerumen and/or referral for hearing test.  His chronic kidney disease is managed by nephrology. He is also followed at the [place] [facility] at [facility]. Past Medical History:  Diagnosis Date  . Arthritis   . At high risk for falls 08/16/2015  . Bradycardia   . Chronic kidney disease    stage 3 GFR 30-59 ml/min   . CIDP (chronic inflammatory demyelinating polyneuropathy) ([place])   . CKD (chronic kidney disease) stage 3, GFR 30-59 ml/min ([name]) 08/16/2015  . Dysphagia as late effect of cerebrovascular disease    pts wife states pt has to eat soft foods   . Elevated liver enzymes 08/10/2016  . GERD (gastroesophageal reflux disease)   . Glaucoma   . High cholesterol   . History of CVA with residual deficit 03/25/2013  . Hypertension   . Hypertensive retinopathy of both eyes 01/16/2017  . Inguinal hernia 03/25/2013  . Liver hemangioma 08/14/2016  . Neuromuscular disorder ([place])    chronic inflammatory demyelinating polyneuropathy   . New onset seizure ([place]) 07/08/2017   seizure 07/14/18  . Nuclear sclerosis of both eyes 10/25/2016  . Presence of permanent cardiac pacemaker    placed in april 2018  . Primary open angle glaucoma of both eyes, indeterminate stage 10/25/2016  . Renal mass, right 08/14/2016  . Status cardiac pacemaker 01/29/2017   Placed for second degree heart block on 01/16/17 Medtronic Azure XT DR MRI SureScan dual-chamber pacemaker  . Stroke Darrell [name])    2011 with residual deficit left sided weakness  . Tobacco dependence  Allergies  Allergen Reactions  . Ace Inhibitors Other (See Comments)    Hyperkalemia  . Doxycycline Other (See Comments)    Hiccups, cough, nausea and emesis, elevated liver enzymes, elevated eosinophils, SOB concerning for early DRESS syndrome   . Atacand Hct [Candesartan Cilexetil-Hctz] Hives  . Shellfish Allergy Hives    Current Outpatient Medications on File Prior to Visit  Medication Sig Dispense Refill   . aspirin EC 81 MG tablet Take 1 tablet (81 mg total) by mouth daily. 90 tablet 3  . atorvastatin (LIPITOR) 40 MG tablet Take 1 tablet (40 mg total) by mouth daily. 90 tablet 1  . brimonidine (ALPHAGAN P) 0.1 % SOLN Place 1 drop into both eyes daily.     . cetirizine (ZYRTEC) 10 MG tablet Take 1 tablet (10 mg total) by mouth daily. 90 tablet 1  . dorzolamide-timolol (COSOPT) 22.3-6.8 MG/ML ophthalmic solution Place 1 drop into both eyes daily.     . Ferrous Sulfate (IRON) 325 (65 Fe) MG TABS TAKE 1 TABLET BY MOUTH TWICE DAILY WITH MEALS 60 tablet 0  . fluticasone (FLONASE) 50 MCG/ACT nasal spray Place 2 sprays into both nostrils daily. 16 g 3  . guaiFENesin-dextromethorphan (ROBITUSSIN DM) 100-10 MG/5ML syrup Take 5 mLs by mouth every 6 (six) hours as needed for cough. 236 mL 0  . latanoprost (XALATAN) 0.005 % ophthalmic solution Place 1 drop into both eyes at bedtime.    . levETIRAcetam (KEPPRA) 500 MG tablet Take 1 tablet (500 mg total) by mouth 2 (two) times daily. 180 tablet 4  . metoprolol tartrate (LOPRESSOR) 50 MG tablet Take 1 tablet (50 mg total) by mouth 2 (two) times daily. 180 tablet 3  . Misc. Devices MISC Yankauer suction. Dx: ALS 1 each 0  . Misc. Devices MISC Coughalator 1 each 0  . Misc. Devices MISC Nebulizer machine.  Diagnosis- Motor neuron disease 1 each 0  . Misc. Devices [place] [name] Bed DXg12.20 1 each 0  . Misc. [place] [name] bed extension. Diagnosis: ALS. Height 6'4" 1 each 0  . Multiple Vitamins-Iron (DAILY VITAMIN FORMULA+IRON) TABS Take 1 tablet by mouth daily.    . tamsulosin (FLOMAX) 0.4 MG CAPS capsule Take 1 capsule (0.4 mg total) by mouth daily. 90 capsule 1   No current facility-administered medications on file prior to visit.    Observations/Objective: Awake, alert, oriented x3 Not in acute distress  Assessment and Plan: 1. Hemiparesis affecting left side as late effect of stroke ([name]) Stable Risk factor modification -  atorvastatin (LIPITOR) 40 MG tablet; Take 1 tablet (40 mg total) by mouth daily.  Dispense: 90 tablet; Refill: 1  2. Screening for colon cancer Given dark-colored stool we will need to screen for colon cancer and if positive refer to GI - [facility]  3. 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[place]) We will refer to [facility] of treatment benefit for metastatic disease We will have case manager reach out to him  4. Dark stools See #2 above  5. Other insomnia Underlying anxiety could be contributing as well as his daytime naps Advised to avoid napping during the day - hydrOXYzine (ATARAX/VISTARIL) 25 MG tablet; Take 1 tablet (25 mg total) by mouth at bedtime.  Dispense: 30 tablet; Refill: 1  6. Diarrhea, unspecified type - dicyclomine (BENTYL) 10 MG capsule; Take 1 capsule (10 mg total) by mouth in the morning and at bedtime.  Dispense: 60 capsule; Refill: 3  7. Other specified forms of hearing loss, unspecified  laterality Advised to use OTC ceruminolytic's If symptoms persist he will need an in person visit to evaluate for cerumen impaction and possible ear irrigation Consider cardiology referral if symptoms are refractory   Follow Up Instructions: Return if symptoms do not improve otherwise to keep appointment for 3 months   I discussed the assessment and treatment plan with the patient. The patient was provided an opportunity to ask questions and all were answered. The patient agreed with the plan and demonstrated an understanding of the instructions.   The patient was advised to call back or seek an in-person evaluation if the symptoms worsen or if the condition fails to improve as anticipated.     I provided 24 minutes total of non-face-to-face time during this encounter including median intraservice time, reviewing previous notes, investigations, ordering medications, medical decision making, coordinating care and patient verbalized understanding at the end of the  visit.     [name], MD, FAAFP. [facility] and [place], [place]   08/02/2020, 1:31 PM

## 2020-08-02 NOTE — Progress Notes (Signed)
Has been having frequent lose bowls.  Wife states that she see blood sometimes, he may have a hemorrhoid.  Not taking iron medication at this time.  Not sleeping.

## 2020-08-02 NOTE — Telephone Encounter (Signed)
I briefly mentioned [facility] to his wife. Can you please discuss with them in detail to see if they would be interested? Thanks.

## 2020-08-03 ENCOUNTER — Encounter: Payer: Self-pay | Admitting: Family Medicine

## 2020-08-03 DIAGNOSIS — Z720 Tobacco use: Secondary | ICD-10-CM | POA: Diagnosis not present

## 2020-08-03 DIAGNOSIS — J410 Simple chronic bronchitis: Secondary | ICD-10-CM | POA: Diagnosis not present

## 2020-08-05 ENCOUNTER — Telehealth: Payer: Self-pay

## 2020-08-05 NOTE — Telephone Encounter (Signed)
At request of Dr Margarita Rana, call placed to patient's wife, [name], to discuss services provided by [facility].  She said she was at a medical appointment and requested this CM call back at another time.   She was in agreement to having this CM call back 08/09/2020

## 2020-08-09 ENCOUNTER — Other Ambulatory Visit: Payer: Self-pay | Admitting: Family Medicine

## 2020-08-09 ENCOUNTER — Telehealth: Payer: Self-pay

## 2020-08-09 DIAGNOSIS — J3489 Other specified disorders of nose and nasal sinuses: Secondary | ICD-10-CM

## 2020-08-09 NOTE — Telephone Encounter (Signed)
Attempted to contact patient's wife, [name], to discuss the PACE program.  Call placed to # [phone], voicemail full, unable to leave message. Call placed to # [phone], the phone just rings, no option for voicemail

## 2020-08-10 NOTE — Telephone Encounter (Signed)
Call placed to patient's wife, Darrell [name], and explained the PACE program.  She was interested in the services that the program provides but was not sure about having to change current PCP to a PACE provider. She was agreeable to having PACE contact her with information.  This CM also explained the CAP services and encouraged her to contact CAP. Her husband does not need community medicaid for this program and may qualify for special assistance medicaid. Provided her with the phone numbers for CAP and PACE.   Call placed to PACE, spoke to [facility] and placed referral. She said she would contact [name] to provide more information about their program.

## 2020-08-14 DIAGNOSIS — G1220 Motor neuron disease, unspecified: Secondary | ICD-10-CM | POA: Diagnosis not present

## 2020-08-26 ENCOUNTER — Telehealth: Payer: Self-pay

## 2020-08-26 NOTE — Telephone Encounter (Signed)
Hydroxyzine PA denied today.

## 2020-08-28 DIAGNOSIS — R059 Cough, unspecified: Secondary | ICD-10-CM | POA: Diagnosis not present

## 2020-08-28 DIAGNOSIS — J3489 Other specified disorders of nose and nasal sinuses: Secondary | ICD-10-CM | POA: Diagnosis not present

## 2020-08-28 DIAGNOSIS — G1221 Amyotrophic lateral sclerosis: Secondary | ICD-10-CM | POA: Diagnosis not present

## 2020-08-28 DIAGNOSIS — R197 Diarrhea, unspecified: Secondary | ICD-10-CM | POA: Diagnosis not present

## 2020-08-31 DIAGNOSIS — N2889 Other specified disorders of kidney and ureter: Secondary | ICD-10-CM | POA: Diagnosis not present

## 2020-08-31 DIAGNOSIS — N183 Chronic kidney disease, stage 3 unspecified: Secondary | ICD-10-CM | POA: Diagnosis not present

## 2020-08-31 DIAGNOSIS — I129 Hypertensive chronic kidney disease with stage 1 through stage 4 chronic kidney disease, or unspecified chronic kidney disease: Secondary | ICD-10-CM | POA: Diagnosis not present

## 2020-09-10 ENCOUNTER — Telehealth: Payer: Self-pay

## 2020-09-10 NOTE — Telephone Encounter (Signed)
Volunteer called patient on behalf of Palliative Care and did not get a answer from patient/family. ° °

## 2020-09-13 DIAGNOSIS — G1220 Motor neuron disease, unspecified: Secondary | ICD-10-CM | POA: Diagnosis not present

## 2020-09-21 ENCOUNTER — Ambulatory Visit (INDEPENDENT_AMBULATORY_CARE_PROVIDER_SITE_OTHER): Payer: Medicare HMO

## 2020-09-21 DIAGNOSIS — I459 Conduction disorder, unspecified: Secondary | ICD-10-CM

## 2020-09-21 LAB — CUP PACEART REMOTE DEVICE CHECK
Battery Remaining Longevity: 95 mo
Battery Voltage: 2.98 V
Brady Statistic AP VP Percent: 90.76 %
Brady Statistic AP VS Percent: 1.44 %
Brady Statistic AS VP Percent: 2.73 %
Brady Statistic AS VS Percent: 5.07 %
Brady Statistic RA Percent Paced: 92.94 %
Brady Statistic RV Percent Paced: 93.49 %
Date Time Interrogation Session: 20211228010408
Implantable Lead Implant Date: 20180424
Implantable Lead Implant Date: 20180424
Implantable Lead Location: 753859
Implantable Lead Location: 753860
Implantable Lead Model: 5076
Implantable Lead Model: 5076
Implantable Pulse Generator Implant Date: 20180424
Lead Channel Impedance Value: 285 Ohm
Lead Channel Impedance Value: 323 Ohm
Lead Channel Impedance Value: 380 Ohm
Lead Channel Impedance Value: 399 Ohm
Lead Channel Pacing Threshold Amplitude: 0.5 V
Lead Channel Pacing Threshold Amplitude: 0.625 V
Lead Channel Pacing Threshold Pulse Width: 0.4 ms
Lead Channel Pacing Threshold Pulse Width: 0.4 ms
Lead Channel Sensing Intrinsic Amplitude: 1.625 mV
Lead Channel Sensing Intrinsic Amplitude: 1.625 mV
Lead Channel Sensing Intrinsic Amplitude: 6.125 mV
Lead Channel Sensing Intrinsic Amplitude: 6.125 mV
Lead Channel Setting Pacing Amplitude: 1.5 V
Lead Channel Setting Pacing Amplitude: 2.5 V
Lead Channel Setting Pacing Pulse Width: 0.4 ms
Lead Channel Setting Sensing Sensitivity: 2 mV

## 2020-10-04 NOTE — Progress Notes (Signed)
Remote pacemaker transmission.   

## 2020-10-13 ENCOUNTER — Ambulatory Visit: Payer: Medicare HMO | Admitting: Podiatry

## 2020-10-14 DIAGNOSIS — G1220 Motor neuron disease, unspecified: Secondary | ICD-10-CM | POA: Diagnosis not present

## 2020-11-10 ENCOUNTER — Ambulatory Visit: Payer: Medicare HMO | Admitting: Podiatry

## 2020-11-14 DIAGNOSIS — G1220 Motor neuron disease, unspecified: Secondary | ICD-10-CM | POA: Diagnosis not present

## 2020-11-15 ENCOUNTER — Other Ambulatory Visit: Payer: Self-pay | Admitting: Cardiology

## 2020-11-16 ENCOUNTER — Encounter: Payer: Self-pay | Admitting: Cardiology

## 2020-11-17 ENCOUNTER — Other Ambulatory Visit: Payer: Self-pay | Admitting: Family Medicine

## 2020-11-17 DIAGNOSIS — J3489 Other specified disorders of nose and nasal sinuses: Secondary | ICD-10-CM

## 2020-12-12 DIAGNOSIS — G1220 Motor neuron disease, unspecified: Secondary | ICD-10-CM | POA: Diagnosis not present

## 2020-12-13 ENCOUNTER — Encounter: Payer: Self-pay | Admitting: Diagnostic Neuroimaging

## 2020-12-13 ENCOUNTER — Ambulatory Visit: Payer: Medicare HMO | Admitting: Diagnostic Neuroimaging

## 2020-12-13 ENCOUNTER — Other Ambulatory Visit: Payer: Self-pay

## 2020-12-13 VITALS — BP 108/80 | HR 76

## 2020-12-13 DIAGNOSIS — R569 Unspecified convulsions: Secondary | ICD-10-CM | POA: Diagnosis not present

## 2020-12-13 DIAGNOSIS — G1220 Motor neuron disease, unspecified: Secondary | ICD-10-CM | POA: Diagnosis not present

## 2020-12-13 MED ORDER — LEVETIRACETAM 500 MG PO TABS
500.0000 mg | ORAL_TABLET | Freq: Two times a day (BID) | ORAL | 4 refills | Status: DC
Start: 2020-12-13 — End: 2021-12-19

## 2020-12-13 NOTE — Progress Notes (Signed)
[facility]  PATIENT: Darrell [name] DOB: 02/25/1951  REFERRING CLINICIAN: Vanita Panda HISTORY FROM: patient and wife REASON FOR VISIT: follow up   HISTORICAL  CHIEF COMPLAINT:  Chief Complaint  Patient presents with  . Seizures    Rm 7 one year FU, wife -[name]  "no seizure activity; more weakness, c/o ankle pain"    HISTORY OF PRESENT ILLNESS:   UPDATE (12/13/20, VRP): Since last visit, continued progression of weakness. No seizures. No alleviating or aggravating factors. Tolerating meds.  UPDATE (12/15/19, VRP): Since last visit, no seizures. Some progrssion of motor neuron dz.  Tolerating meds.  UPDATE (05/20/19, VRP): Since last visit, was in the hospital for choking episode, AKI, constipation. Now slightly better. No seizures. Muscle weakness progressing. Dysphagia / dysarthria worsening.    UPDATE (11/11/18, VRP): Since last visit, had seizure on 10/29/18; depakote was increased to 1250mg  twice a day. then could not afford medication, so we changed levetiracetam to 500mg  twice a day. Now tolerating meds, and more affordable.   UPDATE (07/24/18, VRP): Since last visit, has had 2nd opinion at [facility], and now likely has ALS or PLS diagnosis. Symptoms are progressive. Severity is severe. No alleviating or aggravating factors. Has follow up in [place] clinic.  Also had 2 more seizures on 07/14/18; VPA level at ER was undetectable. Given IV loading dose and sent home.   UPDATE (03/19/18, VRP): Since last visit, doing well. No seizures. No alleviating or aggravating factors. More muscle stiffness.   NEW HPI (09/13/17, VRP): 70 year old male with CIDP, here for seizure evaluation. Since last visit, patient declined IVIG therapy that I had ordered in 2015, and then patient lost to followup. Gait and balance continue to decline. 20lb weight loss over last year. Now with new onset seizure in Oct 2018 at home (witnessed by wife). Admitted to hospital and started on  levetiracetam, then changed to divalproex 500mg  twice a day, and doing well. No further seizures. No alleviating or aggravating factors.   UPDATE 03/30/14: Since last visit, patient feels strength is stable. Still with slurred speech, swallow diff, balance diff. Now he is interested in IVIG treatment. Completed PT sessions.  UPDATE 11/25/13: Since last visit patient had EMG and spinal tap. Findings are suspicious for CIDP. Patient's symptoms have gradually progressed. Continues to have more upper and lower extremity weakness, dysarthria, dysphasia and intermittent muscle twitching. Patient is frustrated today and does not want to take anymore medication. Patient's wife would like to find out more options for treatment.  UPDATE 10/03/13: Since last visit, continues to have left sided weakness, worsening speech and swallow difficulty. Is drinking half pint Etoh per 1-2 weeks. Cigarette use has increased to 2 packs per day.  PRIOR HPI (04/03/13): 70 year old right-handed male with hypertension, hypercholesterolemia, smoking, alcohol abuse, here for evaluation of gait and balance difficulty for past one year.  2011 patient had left arm and left leg weakness, diagnosed with stroke. Apparently he was admitted to hospital and treated. He had fairly good recovery initial one year after stroke. He required using a walking stick but otherwise was fairly mobile. Unfortunately he did not have good medical followup at that time. Patient was abusing alcohol significantly at that time, ranging from 1 pint up to a fifth of alcohol per day. In the past one to 2 years he has cut down on his drinking, now averaging 1 pint of alcohol per week.  In the past one year, patient's mobility is significantly deteriorated. He  also has increasing slurred speech, trouble swallowing, balance and gait difficulty. He continues to fall down. Symptoms are worse when he drinks alcohol. Nowadays he is unable to stand up and walk across a room  without assistance. This is been the case for the past one year.  REVIEW OF SYSTEMS: Full 14 system review of systems performed and negative except: as per HPI.    ALLERGIES: Allergies  Allergen Reactions  . Ace Inhibitors Other (See Comments)    Hyperkalemia  . Doxycycline Other (See Comments)    Hiccups, cough, nausea and emesis, elevated liver enzymes, elevated eosinophils, SOB concerning for early DRESS syndrome   . Atacand Hct [Candesartan Cilexetil-Hctz] Hives  . Shellfish Allergy Hives    HOME MEDICATIONS: Outpatient Medications Prior to Visit  Medication Sig Dispense Refill  . aspirin EC 81 MG tablet Take 1 tablet (81 mg total) by mouth daily. 90 tablet 3  . atorvastatin (LIPITOR) 40 MG tablet Take 1 tablet (40 mg total) by mouth daily. 90 tablet 1  . azithromycin (ZITHROMAX) 500 MG tablet Take by mouth daily. 12/13/20 take 1 tab every Mon, Wed, Fri    . brimonidine (ALPHAGAN P) 0.1 % SOLN Place 1 drop into both eyes daily.     . cetirizine (ZYRTEC) 10 MG tablet Take 1 tablet by mouth once daily 90 tablet 1  . dicyclomine (BENTYL) 10 MG capsule Take 1 capsule (10 mg total) by mouth in the morning and at bedtime. 60 capsule 3  . dorzolamide-timolol (COSOPT) 22.3-6.8 MG/ML ophthalmic solution Place 1 drop into both eyes daily.     . Ferrous Sulfate (IRON) 325 (65 Fe) MG TABS TAKE 1 TABLET BY MOUTH TWICE DAILY WITH MEALS 60 tablet 0  . fluticasone (FLONASE) 50 MCG/ACT nasal spray Place 2 sprays into both nostrils daily. 16 g 3  . guaiFENesin-dextromethorphan (ROBITUSSIN DM) 100-10 MG/5ML syrup Take 5 mLs by mouth every 6 (six) hours as needed for cough. 236 mL 0  . hydrOXYzine (ATARAX/VISTARIL) 25 MG tablet Take 1 tablet (25 mg total) by mouth at bedtime. 30 tablet 1  . latanoprost (XALATAN) 0.005 % ophthalmic solution Place 1 drop into both eyes at bedtime.    . levETIRAcetam (KEPPRA) 500 MG tablet Take 1 tablet (500 mg total) by mouth 2 (two) times daily. 180 tablet 4  .  metoprolol tartrate (LOPRESSOR) 50 MG tablet Take 1 tablet by mouth twice daily 180 tablet 3  . Misc. Devices MISC Yankauer suction. Dx: ALS 1 each 0  . Misc. Devices MISC Coughalator 1 each 0  . Misc. Devices MISC Nebulizer machine.  Diagnosis- Motor neuron disease 1 each 0  . Misc. Devices [place] [facility] Bed DXg12.20 1 each 0  . Misc. [place] [facility] bed extension. Diagnosis: ALS. Height 6'4" 1 each 0  . Multiple Vitamins-Iron (DAILY VITAMIN FORMULA+IRON) TABS Take 1 tablet by mouth daily.    . tamsulosin (FLOMAX) 0.4 MG CAPS capsule Take 1 capsule (0.4 mg total) by mouth daily. 90 capsule 1   No facility-administered medications prior to visit.    PAST MEDICAL HISTORY: Past Medical History:  Diagnosis Date  . Arthritis   . At high risk for falls 08/16/2015  . Bradycardia   . Chronic kidney disease    stage 3 GFR 30-59 ml/min   . CIDP (chronic inflammatory demyelinating polyneuropathy) ([place])   . CKD (chronic kidney disease) stage 3, GFR 30-59 ml/min ([name]) 08/16/2015  . Dysphagia as late effect of cerebrovascular disease  pts wife states pt has to eat soft foods   . Elevated liver enzymes 08/10/2016  . GERD (gastroesophageal reflux disease)   . Glaucoma   . High cholesterol   . History of CVA with residual deficit 03/25/2013  . Hypertension   . Hypertensive retinopathy of both eyes 01/16/2017  . Inguinal hernia 03/25/2013  . Liver hemangioma 08/14/2016  . Neuromuscular disorder ([place])    chronic inflammatory demyelinating polyneuropathy   . New onset seizure ([place]) 07/08/2017   seizure 07/14/18  . Nuclear sclerosis of both eyes 10/25/2016  . Presence of permanent cardiac pacemaker    placed in april 2018  . Primary open angle glaucoma of both eyes, indeterminate stage 10/25/2016  . Renal mass, right 08/14/2016  . Status cardiac pacemaker 01/29/2017   Placed for second degree heart block on 01/16/17 Medtronic Azure XT DR MRI SureScan dual-chamber pacemaker  . Stroke  [facility])    2011 with residual deficit left sided weakness  . Tobacco dependence     PAST SURGICAL HISTORY: Past Surgical History:  Procedure Laterality Date  . EYE SURGERY    . HERNIA REPAIR    . INGUINAL HERNIA REPAIR Right 11/23/2014   Procedure: right inguinal hernia repair with mesh;  Surgeon: Armandina Gemma, MD;  Location: [facility];  Service: General;  Laterality: Right;  . INSERTION OF MESH N/A 11/23/2014   Procedure: INSERTION OF MESH;  Surgeon: Armandina Gemma, MD;  Location: [facility];  Service: General;  Laterality: N/A;  . MASS EXCISION Left 08/29/2017   Procedure: EXCISION OF LEFT NECK MASS;  Surgeon: Coralie Keens, MD;  Location: [facility];  Service: General;  Laterality: Left;  . PACEMAKER IMPLANT N/A 01/16/2017   Procedure: Pacemaker Implant;  Surgeon: Will Meredith Leeds, MD;  Location: [place] [facility];  Service: Cardiovascular;  Laterality: N/A;  . SHOULDER SURGERY Bilateral 1988, 1998    FAMILY HISTORY: Family History  Problem Relation Age of Onset  . Hypertension Mother   . Diabetes Sister   . Hypertension Sister   . Cancer Sister        1 sister  . COPD Sister        in 1 sister  . Stomach cancer Neg Hx   . Colon cancer Neg Hx   . Pancreatic cancer Neg Hx   . Esophageal cancer Neg Hx     SOCIAL HISTORY:  Social History   Socioeconomic History  . Marital status: Married    Spouse name: [name]  . Number of children: 1  . Years of education: college  . Highest education level: Not on file  Occupational History  . Occupation: retired  Tobacco Use  . Smoking status: Current Every Day Smoker    Packs/day: 1.00    Years: 40.00    Pack years: 40.00    Types: Cigarettes  . Smokeless tobacco: Never Used  . Tobacco comment: 12/13/20 5 a day  Vaping Use  . Vaping Use: Never used  Substance and Sexual Activity  . Alcohol use: No    Comment: alcohol free for 1 year was drinking 1/2 pint per day   . Drug use: No  . Sexual activity: Not on file  Other Topics  Concern  . Not on file  Social History Narrative   09/13/17 Patient lives at home with spouse.   Has 23 yo daughter   No grandchildren   Social Determinants of Radio broadcast assistant Strain: Not on file  Food Insecurity: Not on file  Transportation Needs: Not on file  Physical Activity: Not on file  Stress: Not on file  Social Connections: Not on file  Intimate Partner Violence: Not on file     PHYSICAL EXAM  Vitals:   12/13/20 1524  BP: 108/80  Pulse: 76   Not recorded     There is no height or weight on file to calculate BMI.  GENERAL EXAM: Patient is in no distress  CARDIOVASCULAR: Regular rate and rhythm, no murmurs, no carotid bruits  NEUROLOGIC: MENTAL STATUS: awake, alert, language fluent, comprehension intact, naming intact CRANIAL NERVE: pupils equal and reactive to light, visual fields full to confrontation; ABLE TO COUNT FINGERS, extraocular muscles intact, no nystagmus, facial sensation and strength symmetric, uvula midline, shoulder shrug symmetric, tongue midline. SEVERE DYSARTHIA. INTERMITTENT TONGUE FASCICULATIONS.  MOTOR: INCREASED TONE IN LUE > RUE; LLE > RLE. RUE 3-4, RLE 3-4. LUE 2-3. LLE 2-3  SENSORY: INTACT TO LE COORDINATION: LIMITED IN LEFT SIDE. REFLEXES: BUE 2; BLE 2; CLONUS AT ANKLES (L > R) GAIT/STATION: LEANS BACK IN WHEELCHAIR; CANNOT STAND INDEPENDENTLY    DIAGNOSTIC DATA (LABS, IMAGING, TESTING) - I reviewed patient records, labs, notes, testing and imaging myself where available.  Lab Results  Component Value Date   WBC 7.0 03/31/2020   HGB 10.2 (L) 03/31/2020   HCT 31.4 (L) 03/31/2020   MCV 94 03/31/2020   PLT 227 03/31/2020      Component Value Date/Time   NA 142 03/31/2020 1417   K 5.1 03/31/2020 1417   CL 107 (H) 03/31/2020 1417   CO2 27 03/31/2020 1417   GLUCOSE 95 03/31/2020 1417   GLUCOSE 91 05/08/2019 0524   BUN 24 03/31/2020 1417   CREATININE 2.09 (H) 03/31/2020 1417   CREATININE 1.45 (H) 08/10/2016  1647   CALCIUM 10.4 (H) 03/31/2020 1417   PROT 6.0 03/31/2020 1417   ALBUMIN 3.5 (L) 03/31/2020 1417   AST 14 03/31/2020 1417   ALT 11 03/31/2020 1417   ALKPHOS 108 03/31/2020 1417   BILITOT 0.2 03/31/2020 1417   GFRNONAA 32 (L) 03/31/2020 1417   GFRNONAA 51 (L) 08/10/2016 1647   GFRAA 37 (L) 03/31/2020 1417   GFRAA 58 (L) 08/10/2016 1647   Lab Results  Component Value Date   CHOL 112 06/13/2018   HDL 40 06/13/2018   LDLCALC 62 06/13/2018   TRIG 51 06/13/2018   CHOLHDL 2.8 06/13/2018   Lab Results  Component Value Date   HGBA1C 5.1 08/20/2019   Lab Results  Component Value Date   VITAMINB12 400 05/05/2019   Lab Results  Component Value Date   TSH 1.251 05/05/2019    02/27/13 CT head - severe chronic small vessel ischemic disease, periventricular, subcortical, pontine and cerebellar.   04/22/13 MRI BRAIN 1. Scattered chronic lacunar ischemic infarctions in the bilateral thalami, right basal ganglia, right centrum semioval and left anterior parasagittal regions.  2. Moderate chronic small vessel ischemic disease. Few small, chronic cerebral microbleeds in the right parietal and and left frontal regions, also within spectrum of chronic small vessel ischemic disease. 3. No acute findings.  04/22/13 MRI CERVICAL SPINE 1. At C5-6: disc bulging and facet hypertrophy with mild spinal stenosis (AP diameter 102mm) and severe biforaminal foraminal stenosis; no cord signal changes. 2. Multilevel degenerative changes and foraminal stenosis from C2-3 to C6-7 as above.   10/20/13 EMG/NCS 1. 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 Abnormal F-wave latencies (including  absent F-waves and slowing in the demyelinating range in some  nerves) raises possibility of concomitant polyradiculopathy.  Considerations include chronic immune/inflammatory neuropathies,  such as CIDP. 2. No evidence of myopathy at this time.  10/31/13 CSF results: WBC 5-->0, RBC  25-->2, protein 63, glucose 62, ACE 5, cytology negati ve, culture negative  07/08/17 EEG - There is a posterior dominant rhythm of 7-8 Hz reactive to eye opening and closure.  No focal slowing is present.  No epileptiform discharges or seizures. - There is evidence of mild generalized slowing of brain activity consistent with probable post-ictal state +/- toxic, metabolic, or infectious encephalopathy.  The patient is not in non-convulsive status epilepticus.   07/09/17 MRI brain  - Truncated and motion degraded examination. Within that limitation, no acute ischemia or significant mass effect. Advanced chronic microvascular disease.    ASSESSMENT AND PLAN  70 y.o. year old male  has a past medical history of Arthritis, At high risk for falls (08/16/2015), Bradycardia, Chronic kidney disease, CIDP (chronic inflammatory demyelinating polyneuropathy) ([place]), CKD (chronic kidney disease) stage 3, GFR 30-59 ml/min ([name]) (08/16/2015), Dysphagia as late effect of cerebrovascular disease, Elevated liver enzymes (08/10/2016), GERD (gastroesophageal reflux disease), Glaucoma, High cholesterol, History of CVA with residual deficit (03/25/2013), Hypertension, Hypertensive retinopathy of both eyes (01/16/2017), Inguinal hernia (03/25/2013), Liver hemangioma (08/14/2016), Neuromuscular disorder ([place]), New onset seizure ([place]) (07/08/2017), Nuclear sclerosis of both eyes (10/25/2016), Presence of permanent cardiac pacemaker, Primary open angle glaucoma of both eyes, indeterminate stage (10/25/2016), Renal mass, right (08/14/2016), Status cardiac pacemaker (01/29/2017), Stroke ([place]), and Tobacco dependence. here with progressive gait and balance or deterioration since 2013. Also with dysarthria, dysphagia, left sided weakness and hyperreflexia.   Now with new onset seizure in Oct 2018. Breakthrough sz in Oct 2019.    Dx1 gait and balance diff: motor neuron disease (PLS) + etoh neuropathy  Dx2: seizure disorder  1.  Seizures ([place])   2. 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[place])      PLAN:  SEIZURE DISORDER (? related to CNS vascular disease) - continue levetiracetam 500mg  twice a day   MUSCLE WEAKNESS / NEUROPATHY / HYPERTONIA / DYSPHAGIA (suspected PLS; + sensory neuropathy from ETOH?) - follow up as needed with ALS/PLS clinic (Dr. Tillman Abide)  CEREBROVASCULAR DISEASE - Risk factor mgmt per PCP for cerebrovascular disease - continue smoking and ETOH cessation  CERVICAL SPINAL STENOSIS - Conservative mgmt of cervical spinal stenosis (mild at C5-6)  Meds ordered this encounter  Medications  . levETIRAcetam (KEPPRA) 500 MG tablet    Sig: Take 1 tablet (500 mg total) by mouth 2 (two) times daily.    Dispense:  180 tablet    Refill:  4   Return in about 1 year (around 12/13/2021).    Penni Bombard, MD 1/61/0960, 4:54 PM Certified in Neurology, Neurophysiology and Neuroimaging  [facility] [address] [place], [place] [address] [phone]

## 2020-12-19 ENCOUNTER — Other Ambulatory Visit: Payer: Self-pay | Admitting: Family Medicine

## 2020-12-19 DIAGNOSIS — R197 Diarrhea, unspecified: Secondary | ICD-10-CM

## 2020-12-19 NOTE — Telephone Encounter (Signed)
Patient has multiple refills on file. This request is refused-not needed.

## 2020-12-20 ENCOUNTER — Ambulatory Visit: Payer: Medicare HMO | Admitting: Diagnostic Neuroimaging

## 2020-12-21 ENCOUNTER — Ambulatory Visit (INDEPENDENT_AMBULATORY_CARE_PROVIDER_SITE_OTHER): Payer: Medicare HMO

## 2020-12-21 DIAGNOSIS — I459 Conduction disorder, unspecified: Secondary | ICD-10-CM

## 2020-12-21 LAB — CUP PACEART REMOTE DEVICE CHECK
Battery Remaining Longevity: 93 mo
Battery Voltage: 2.98 V
Brady Statistic AP VP Percent: 53.39 %
Brady Statistic AP VS Percent: 2.36 %
Brady Statistic AS VP Percent: 11.24 %
Brady Statistic AS VS Percent: 33.01 %
Brady Statistic RA Percent Paced: 62.72 %
Brady Statistic RV Percent Paced: 64.54 %
Date Time Interrogation Session: 20220329021354
Implantable Lead Implant Date: 20180424
Implantable Lead Implant Date: 20180424
Implantable Lead Location: 753859
Implantable Lead Location: 753860
Implantable Lead Model: 5076
Implantable Lead Model: 5076
Implantable Pulse Generator Implant Date: 20180424
Lead Channel Impedance Value: 285 Ohm
Lead Channel Impedance Value: 323 Ohm
Lead Channel Impedance Value: 399 Ohm
Lead Channel Impedance Value: 399 Ohm
Lead Channel Pacing Threshold Amplitude: 0.5 V
Lead Channel Pacing Threshold Amplitude: 0.625 V
Lead Channel Pacing Threshold Pulse Width: 0.4 ms
Lead Channel Pacing Threshold Pulse Width: 0.4 ms
Lead Channel Sensing Intrinsic Amplitude: 2.875 mV
Lead Channel Sensing Intrinsic Amplitude: 2.875 mV
Lead Channel Sensing Intrinsic Amplitude: 6.875 mV
Lead Channel Sensing Intrinsic Amplitude: 6.875 mV
Lead Channel Setting Pacing Amplitude: 1.5 V
Lead Channel Setting Pacing Amplitude: 2.5 V
Lead Channel Setting Pacing Pulse Width: 0.4 ms
Lead Channel Setting Sensing Sensitivity: 2 mV

## 2020-12-27 ENCOUNTER — Other Ambulatory Visit: Payer: Self-pay

## 2020-12-27 DIAGNOSIS — R197 Diarrhea, unspecified: Secondary | ICD-10-CM

## 2020-12-27 MED ORDER — DICYCLOMINE HCL 10 MG PO CAPS: 10.0000 mg | ORAL_CAPSULE | Freq: Two times a day (BID) | ORAL | 2 refills | Status: DC

## 2020-12-27 NOTE — Telephone Encounter (Signed)
Requested Prescriptions  Pending Prescriptions Disp Refills  . dicyclomine (BENTYL) 10 MG capsule 60 capsule 2    Sig: Take 1 capsule (10 mg total) by mouth in the morning and at bedtime.     Gastroenterology:  Antispasmodic Agents Passed - 12/27/2020 11:51 AM      Passed - Last Heart Rate in normal range    Pulse Readings from Last 1 Encounters:  12/13/20 76         Passed - Valid encounter within last 12 months    Recent Outpatient Visits          4 months ago Screening for colon cancer   [place], Charlane Ferretti, MD   6 months ago MND (motor neurone disease) [facility])   [place], Enobong, MD   9 months ago Screening for colon cancer   [place], Enobong, MD   1 year ago CKD (chronic kidney disease), stage IV [facility])   [place], Enobong, MD   1 year ago Diarrhea, unspecified type   [facility] [name], MD

## 2020-12-27 NOTE — Telephone Encounter (Signed)
Copied from [place] [id number]. Topic: Quick Communication - Rx Refill/Question >> Dec 27, 2020 10:36 AM Robina Ade, Helene Kelp D wrote: Medication: dicyclomine (BENTYL) 10 MG capsule  Has the patient contacted their pharmacy? Yes.  Pt has an appt this month and would like a refill on medication please. (Agent: If no, request that the patient contact the pharmacy for the refill.) (Agent: If yes, when and what did the pharmacy advise?)  Preferred Pharmacy (with phone number or street name): [facility] ([facility] [place] - [address]  Agent: Please be advised that RX refills may take up to 3 business days. We ask that you follow-up with your pharmacy.

## 2020-12-29 ENCOUNTER — Ambulatory Visit: Payer: Medicare HMO | Admitting: Podiatry

## 2021-01-04 NOTE — Progress Notes (Signed)
Remote pacemaker transmission.   

## 2021-01-10 ENCOUNTER — Other Ambulatory Visit: Payer: Self-pay | Admitting: Podiatry

## 2021-01-10 ENCOUNTER — Other Ambulatory Visit: Payer: Self-pay

## 2021-01-10 ENCOUNTER — Ambulatory Visit (INDEPENDENT_AMBULATORY_CARE_PROVIDER_SITE_OTHER): Payer: Medicare HMO

## 2021-01-10 ENCOUNTER — Ambulatory Visit: Payer: Medicare HMO | Admitting: Podiatry

## 2021-01-10 DIAGNOSIS — M818 Other osteoporosis without current pathological fracture: Secondary | ICD-10-CM

## 2021-01-10 DIAGNOSIS — R41841 Cognitive communication deficit: Secondary | ICD-10-CM | POA: Insufficient documentation

## 2021-01-10 DIAGNOSIS — M7752 Other enthesopathy of left foot: Secondary | ICD-10-CM

## 2021-01-10 DIAGNOSIS — M25572 Pain in left ankle and joints of left foot: Secondary | ICD-10-CM

## 2021-01-10 MED ORDER — BETAMETHASONE SOD PHOS & ACET 6 (3-3) MG/ML IJ SUSP
3.0000 mg | Freq: Once | INTRAMUSCULAR | Status: AC
  Administered 2021-01-10: 3 mg via INTRA_ARTICULAR

## 2021-01-10 MED ORDER — MELOXICAM 15 MG PO TABS: 15.0000 mg | ORAL_TABLET | Freq: Every day | ORAL | 1 refills | Status: DC

## 2021-01-10 NOTE — Progress Notes (Signed)
Subjective:  70 y.o. male, wheelchair-bound for the last 3-4 years secondary to stroke, presenting today with his wife for evaluation of left ankle pain has been going on for approximately 1-2 years now.  Patient's wife states that he lays in position and puts a lot of pressure on his ankle.  He denies a history of injury.  Gradual onset.  Patient experiences sharp stabbing sensations to the ankle joint.  He has been taking Tylenol with minimal relief.  They present for further treatment evaluation   Past Medical History:  Diagnosis Date  . Arthritis   . At high risk for falls 08/16/2015  . Bradycardia   . Chronic kidney disease    stage 3 GFR 30-59 ml/min   . CIDP (chronic inflammatory demyelinating polyneuropathy) ([place])   . CKD (chronic kidney disease) stage 3, GFR 30-59 ml/min ([name]) 08/16/2015  . Dysphagia as late effect of cerebrovascular disease    pts wife states pt has to eat soft foods   . Elevated liver enzymes 08/10/2016  . GERD (gastroesophageal reflux disease)   . Glaucoma   . High cholesterol   . History of CVA with residual deficit 03/25/2013  . Hypertension   . Hypertensive retinopathy of both eyes 01/16/2017  . Inguinal hernia 03/25/2013  . Liver hemangioma 08/14/2016  . Neuromuscular disorder ([place])    chronic inflammatory demyelinating polyneuropathy   . New onset seizure ([place]) 07/08/2017   seizure 07/14/18  . Nuclear sclerosis of both eyes 10/25/2016  . Presence of permanent cardiac pacemaker    placed in april 2018  . Primary open angle glaucoma of both eyes, indeterminate stage 10/25/2016  . Renal mass, right 08/14/2016  . Status cardiac pacemaker 01/29/2017   Placed for second degree heart block on 01/16/17 Medtronic Azure XT DR MRI SureScan dual-chamber pacemaker  . Stroke [facility])    2011 with residual deficit left sided weakness  . Tobacco dependence      Objective / Physical Exam:  General:  The patient is alert and oriented x3 in no acute  distress. Dermatology:  Skin is warm, dry and supple bilateral lower extremities. Negative for open lesions or macerations. Vascular:  Palpable pedal pulses bilaterally. No edema or erythema noted. Capillary refill within normal limits. Neurological:  Epicritic and protective threshold grossly intact bilaterally.  Musculoskeletal Exam:  Pain on palpation to the anterior lateral medial aspects of the patient's left ankle. Mild edema noted. Range of motion within normal limits to all pedal and ankle joints bilateral. Muscle strength 5/5 in all groups bilateral.   Radiographic Exam:  Diffuse demineralization of the bones consistent with a disuse osteoporosis.  Joint spaces preserved. No fracture/dislocation/boney destruction identified on radiographic exam  Assessment: 1.  Synovitis/capsulitis left ankle  Plan of Care:  1. Patient was evaluated. X-Rays reviewed.  2. Injection of 0.5 mL Celestone Soluspan injected in the patient's left ankle. 3.  Prescription for meloxicam 15 mg daily 4.  Recommend padding and cushioning with a pillow when the patient lays on the side 5.  Return to clinic in 4 weeks  Edrick Kins, DPM [facility]  Dr. Edrick Kins, DPM    [address], [place] [address]  Office [phone]  Fax [phone]

## 2021-01-12 ENCOUNTER — Ambulatory Visit: Payer: Medicare HMO | Attending: Physician Assistant | Admitting: Physician Assistant

## 2021-01-12 ENCOUNTER — Other Ambulatory Visit: Payer: Self-pay

## 2021-01-12 ENCOUNTER — Encounter: Payer: Self-pay | Admitting: Physician Assistant

## 2021-01-12 DIAGNOSIS — J3489 Other specified disorders of nose and nasal sinuses: Secondary | ICD-10-CM

## 2021-01-12 DIAGNOSIS — J9801 Acute bronchospasm: Secondary | ICD-10-CM | POA: Diagnosis not present

## 2021-01-12 MED ORDER — CETIRIZINE HCL 10 MG PO TABS: 10.0000 mg | ORAL_TABLET | Freq: Every day | ORAL | 1 refills | Status: DC

## 2021-01-12 MED ORDER — FLUTICASONE PROPIONATE 50 MCG/ACT NA SUSP: 2.0000 | Freq: Every day | NASAL | 3 refills | Status: DC

## 2021-01-12 MED ORDER — ALBUTEROL SULFATE HFA 108 (90 BASE) MCG/ACT IN AERS: 1.0000 | INHALATION_SPRAY | Freq: Four times a day (QID) | RESPIRATORY_TRACT | 2 refills | Status: DC | PRN

## 2021-01-12 NOTE — Progress Notes (Signed)
Virtual Visit via Telephone Note  I connected with Darrell [name] on 01/12/21 at  2:50 PM EDT by telephone and verified that I am speaking with the correct person using two identifiers.  Location: Patient: home Provider: [facility] office Spoke with Darrell [name]    I discussed the limitations, risks, security and privacy concerns of performing an evaluation and management service by telephone and the availability of in person appointments. I also discussed with the patient that there may be a patient responsible charge related to this service. The patient expressed understanding and agreed to proceed.   History of Present Illness: Cough has been going on for a few weeks, feels like he is wheezing.  Not using inhaler and not taking allergy meds.  He did take an antibiotic about 1 month ago.  Not coughing up any discolored mucus.  No fever.    He has not done his cologuard test that was ordered in [place].  Last labs in 03/2020.      Observations/Objective:  NAD.  A&Ox3.  Patient sounds like he was napping-so wife gave history after name/date verification   Assessment and Plan: 1. Sinus drainage - cetirizine (ZYRTEC) 10 MG tablet; Take 1 tablet (10 mg total) by mouth daily.  Dispense: 90 tablet; Refill: 1 - fluticasone (FLONASE) 50 MCG/ACT nasal spray; Place 2 sprays into both nostrils daily.  Dispense: 16 g; Refill: 3  2. Bronchospasm - albuterol (VENTOLIN HFA) 108 (90 Base) MCG/ACT inhaler; Inhale 1-2 puffs into the lungs every 6 (six) hours as needed for wheezing or shortness of breath.  Dispense: 18 g; Refill: 2    Follow Up Instructions: See PCP in 3-4 weeks/chronic conditions and due for labs/recheck cough   I discussed the assessment and treatment plan with the patient. The patient was provided an opportunity to ask questions and all were answered. The patient agreed with the plan and demonstrated an understanding of the instructions.   The patient was advised to call back or seek an  in-person evaluation if the symptoms worsen or if the condition fails to improve as anticipated.  I provided 16 minutes of non-face-to-face time during this encounter.   Freeman Caldron, PA-C  Patient ID: Darrell [name], male   DOB: [birth year], 70 y.o.   MRN: [id number]

## 2021-01-17 ENCOUNTER — Ambulatory Visit: Payer: Medicare HMO | Admitting: Cardiology

## 2021-01-17 ENCOUNTER — Other Ambulatory Visit: Payer: Self-pay

## 2021-01-17 ENCOUNTER — Encounter: Payer: Self-pay | Admitting: Cardiology

## 2021-01-17 VITALS — BP 142/76 | HR 78 | Ht 75.5 in | Wt 170.0 lb

## 2021-01-17 DIAGNOSIS — I441 Atrioventricular block, second degree: Secondary | ICD-10-CM

## 2021-01-17 LAB — CUP PACEART INCLINIC DEVICE CHECK
Battery Remaining Longevity: 90 mo
Battery Voltage: 2.98 V
Brady Statistic AP VP Percent: 75.05 %
Brady Statistic AP VS Percent: 6.28 %
Brady Statistic AS VP Percent: 4.14 %
Brady Statistic AS VS Percent: 14.53 %
Brady Statistic RA Percent Paced: 85.23 %
Brady Statistic RV Percent Paced: 79.17 %
Date Time Interrogation Session: 20220425142644
Implantable Lead Implant Date: 20180424
Implantable Lead Implant Date: 20180424
Implantable Lead Location: 753859
Implantable Lead Location: 753860
Implantable Lead Model: 5076
Implantable Lead Model: 5076
Implantable Pulse Generator Implant Date: 20180424
Lead Channel Impedance Value: 285 Ohm
Lead Channel Impedance Value: 323 Ohm
Lead Channel Impedance Value: 399 Ohm
Lead Channel Impedance Value: 399 Ohm
Lead Channel Pacing Threshold Amplitude: 0.5 V
Lead Channel Pacing Threshold Amplitude: 0.75 V
Lead Channel Pacing Threshold Pulse Width: 0.4 ms
Lead Channel Pacing Threshold Pulse Width: 0.4 ms
Lead Channel Sensing Intrinsic Amplitude: 1.4 mV
Lead Channel Sensing Intrinsic Amplitude: 8 mV
Lead Channel Setting Pacing Amplitude: 1.5 V
Lead Channel Setting Pacing Amplitude: 2.5 V
Lead Channel Setting Pacing Pulse Width: 0.4 ms
Lead Channel Setting Sensing Sensitivity: 2 mV

## 2021-01-17 NOTE — Progress Notes (Signed)
Electrophysiology Office Note   Date:  01/17/2021   ID:  Darrell [name] [birth year], MRN [id number]  PCP:  [name], MD  Cardiologist:  none Primary Electrophysiologist:  Tonie Elsey Meredith Leeds, MD    No chief complaint on file.    History of Present Illness: Darrell [name] is a 70 y.o. male who is being seen today for the evaluation of heart block at the request of [name], MD. Presenting today for electrophysiology evaluation. Presented to the hospital when he discovered his HR was in the 40s, found to be in heart block with 2:1 conduction. Medtronic dual chamber pacemaker implanted 01/16/17.  Today, denies symptoms of palpitations, chest pain, shortness of breath, orthopnea, PND, lower extremity edema, claudication, dizziness, presyncope, syncope, bleeding, or neurologic sequela. The patient is tolerating medications without difficulties.  He is currently feeling well without major issue today.  He has had multiple hospitalizations over the past 4 convulsions that were thought to be seizures.  He does have follow-up scheduled with neurology.  Past Medical History:  Diagnosis Date  . Arthritis   . At high risk for falls 08/16/2015  . Bradycardia   . Chronic kidney disease    stage 3 GFR 30-59 ml/min   . CIDP (chronic inflammatory demyelinating polyneuropathy) ([place])   . CKD (chronic kidney disease) stage 3, GFR 30-59 ml/min ([name]) 08/16/2015  . Dysphagia as late effect of cerebrovascular disease    pts wife states pt has to eat soft foods   . Elevated liver enzymes 08/10/2016  . GERD (gastroesophageal reflux disease)   . Glaucoma   . High cholesterol   . History of CVA with residual deficit 03/25/2013  . Hypertension   . Hypertensive retinopathy of both eyes 01/16/2017  . Inguinal hernia 03/25/2013  . Liver hemangioma 08/14/2016  . Neuromuscular disorder ([place])    chronic inflammatory demyelinating polyneuropathy   . New onset seizure ([place]) 07/08/2017   seizure  07/14/18  . Nuclear sclerosis of both eyes 10/25/2016  . Presence of permanent cardiac pacemaker    placed in april 2018  . Primary open angle glaucoma of both eyes, indeterminate stage 10/25/2016  . Renal mass, right 08/14/2016  . Status cardiac pacemaker 01/29/2017   Placed for second degree heart block on 01/16/17 Medtronic Azure XT DR MRI SureScan dual-chamber pacemaker  . Stroke [facility])    2011 with residual deficit left sided weakness  . Tobacco dependence    Past Surgical History:  Procedure Laterality Date  . EYE SURGERY    . HERNIA REPAIR    . INGUINAL HERNIA REPAIR Right 11/23/2014   Procedure: right inguinal hernia repair with mesh;  Surgeon: Armandina Gemma, MD;  Location: [facility];  Service: General;  Laterality: Right;  . INSERTION OF MESH N/A 11/23/2014   Procedure: INSERTION OF MESH;  Surgeon: Armandina Gemma, MD;  [facility]: [facility];  Service: General;  Laterality: N/A;  . MASS EXCISION Left 08/29/2017   Procedure: EXCISION OF LEFT NECK MASS;  Surgeon: Coralie Keens, MD;  Location: [place];  Service: General;  Laterality: Left;  . PACEMAKER IMPLANT N/A 01/16/2017   Procedure: Pacemaker Implant;  Surgeon: Elishia Kaczorowski Meredith Leeds, MD;  Location: [place] [facility];  Service: Cardiovascular;  Laterality: N/A;  . SHOULDER SURGERY Bilateral 1988, 1998     Current Outpatient Medications  Medication Sig Dispense Refill  . albuterol (VENTOLIN HFA) 108 (90 Base) MCG/ACT inhaler Inhale 1-2 puffs into the lungs every 6 (six) hours as needed for wheezing  or shortness of breath. 18 g 2  . aspirin EC 81 MG tablet Take 1 tablet (81 mg total) by mouth daily. 90 tablet 3  . atorvastatin (LIPITOR) 40 MG tablet Take 1 tablet (40 mg total) by mouth daily. 90 tablet 1  . azithromycin (ZITHROMAX) 500 MG tablet Take by mouth daily. 12/13/20 take 1 tab every Mon, Wed, Fri    . brimonidine (ALPHAGAN P) 0.1 % SOLN Place 1 drop into both eyes daily.     . cetirizine (ZYRTEC) 10 MG tablet Take 1 tablet (10 mg total)  by mouth daily. 90 tablet 1  . dicyclomine (BENTYL) 10 MG capsule Take 1 capsule (10 mg total) by mouth in the morning and at bedtime. 60 capsule 2  . dorzolamide-timolol (COSOPT) 22.3-6.8 MG/ML ophthalmic solution Place 1 drop into both eyes daily.     . Ferrous Sulfate (IRON) 325 (65 Fe) MG TABS TAKE 1 TABLET BY MOUTH TWICE DAILY WITH MEALS 60 tablet 0  . fluticasone (FLONASE) 50 MCG/ACT nasal spray Place 2 sprays into both nostrils daily. 16 g 3  . guaiFENesin-dextromethorphan (ROBITUSSIN DM) 100-10 MG/5ML syrup Take 5 mLs by mouth every 6 (six) hours as needed for cough. 236 mL 0  . hydrOXYzine (ATARAX/VISTARIL) 25 MG tablet Take 1 tablet (25 mg total) by mouth at bedtime. 30 tablet 1  . latanoprost (XALATAN) 0.005 % ophthalmic solution Place 1 drop into both eyes at bedtime.    . levETIRAcetam (KEPPRA) 500 MG tablet Take 1 tablet (500 mg total) by mouth 2 (two) times daily. 180 tablet 4  . meloxicam (MOBIC) 15 MG tablet Take 1 tablet (15 mg total) by mouth daily. 30 tablet 1  . metoprolol tartrate (LOPRESSOR) 50 MG tablet Take 1 tablet by mouth twice daily 180 tablet 3  . Misc. Devices MISC Yankauer suction. Dx: ALS 1 each 0  . Misc. Devices MISC Coughalator 1 each 0  . Misc. Devices MISC Nebulizer machine.  Diagnosis- Motor neuron disease 1 each 0  . Misc. Devices [place] [facility] Bed DXg12.20 1 each 0  . Misc. [facility] bed extension. Diagnosis: ALS. Height 6'4" 1 each 0  . Multiple Vitamin (MULTI-VITAMIN) tablet Take by mouth.    . Multiple Vitamins-Iron (DAILY VITAMIN FORMULA+IRON) TABS Take 1 tablet by mouth daily.    . tamsulosin (FLOMAX) 0.4 MG CAPS capsule Take 1 capsule (0.4 mg total) by mouth daily. 90 capsule 1   No current facility-administered medications for this visit.    Allergies:   Ace inhibitors, Doxycycline, Atacand hct [candesartan cilexetil-hctz], and Shellfish allergy   Social History:  The patient  reports that he has been smoking cigarettes.  He has a 40.00 pack-year smoking history. He has never used smokeless tobacco. He reports that he does not drink alcohol and does not use drugs.   Family History:  The patient's family history includes COPD in his sister; Cancer in his sister; Diabetes in his sister; Hypertension in his mother and sister.    ROS:  Please see the history of present illness.   Otherwise, review of systems is positive for weight loss, appetite change, chills, visual changes, cough, snoring, constipation, anxiety.   All other systems are reviewed and negative.   PHYSICAL EXAM: VS:  BP (!) 142/76   Pulse 78   Ht 6' 3.5" (1.918 m)   Wt 170 lb (77.1 kg)   BMI 20.97 kg/m  , BMI Body mass index is 20.97 kg/m. GEN: Well nourished, well developed,  in no acute distress  HEENT: normal  Neck: no JVD, carotid bruits, or masses Cardiac: RRR; no murmurs, rubs, or gallops,no edema  Respiratory:  clear to auscultation bilaterally, normal work of breathing GI: soft, nontender, nondistended, + BS MS: no deformity or atrophy  Skin: warm and dry, device site well healed Neuro:  Strength and sensation are intact Psych: euthymic mood, full affect  EKG:  EKG is not ordered today. Personal review of the ekg ordered 07/14/18 shows sinus rhythm, rate 79  Personal review of the device interrogation today. Results in [place]: 03/31/2020: ALT 11; BUN 24; Creatinine, Ser 2.09; Hemoglobin 10.2; Platelets 227; Potassium 5.1; Sodium 142    Lipid Panel     Component Value Date/Time   CHOL 112 06/13/2018 0954   TRIG 51 06/13/2018 0954   HDL 40 06/13/2018 0954   CHOLHDL 2.8 06/13/2018 0954   CHOLHDL 4.1 08/16/2015 1555   VLDL 23 08/16/2015 1555   LDLCALC 62 06/13/2018 0954     Wt Readings from Last 3 Encounters:  01/17/21 170 lb (77.1 kg)  07/22/19 165 lb (74.8 kg)  05/20/19 162 lb (73.5 kg)      Other studies Reviewed: Additional studies/ records that were reviewed today include: TTE 2014  Review of  the above records today demonstrates:  - Left ventricle: The cavity size was normal. There was severe concentric and mild asymmetric hypertrophy. Systolic function was normal. Wall motion was normal; there were no regional wall motion abnormalities. Doppler parameters are consistent with abnormal left ventricular relaxation (grade 1 diastolic dysfunction). - Aortic root: The aortic root was mildly dilated. - Right atrium: The atrium was mildly dilated. - Pulmonary arteries: Systolic pressure was mildly increased. PA peak pressure: 54mm Hg (S).  ASSESSMENT AND PLAN:  1.  2:1 AV block: Status post Medtronic dual-chamber pacemaker implanted 01/16/2017.  Is functioning appropriately.  No changes at this time.  2. Hypertension: Mildly elevated today but has been well controlled in the past.  No changes.  Current medicines are reviewed at length with the patient today.   The patient does not have concerns regarding his medicines.  The following changes were made today: None  Labs/ tests ordered today include:  Orders Placed This Encounter  Procedures  . EKG 12-Lead     Disposition:   FU with Marbin Olshefski 12 months  Signed, Shuna Tabor Meredith Leeds, MD  01/17/2021 2:24 PM     [address] [address] [phone] (office) [phone] (fax)    Electrophysiology Office Note   Date:  01/17/2021   ID:  Crue, Otero 11-04-1950, MRN [id number]  PCP:  [name], MD  Cardiologist:  none Primary Electrophysiologist:  Quantavis Obryant Meredith Leeds, MD    No chief complaint on file.    History of Present Illness: Darrell [name] is a 70 y.o. male who is being seen today for the evaluation of heart block at the request of [name], MD. Presenting today for electrophysiology evaluation.  He presented to hospital and his heart rate was found to be in the 40s.  He was in 2-1 AV block.  He is status post Medtronic  dual-chamber pacemaker implanted 01/16/2017.  Today, denies symptoms of palpitations, chest pain, shortness of breath, orthopnea, PND, lower extremity edema, claudication, dizziness, presyncope, syncope, bleeding, or neurologic sequela. The patient is tolerating medications without difficulties.    Past Medical History:  Diagnosis Date  . Arthritis   .  At high risk for falls 08/16/2015  . Bradycardia   . Chronic kidney disease    stage 3 GFR 30-59 ml/min   . CIDP (chronic inflammatory demyelinating polyneuropathy) ([place])   . CKD (chronic kidney disease) stage 3, GFR 30-59 ml/min ([name]) 08/16/2015  . Dysphagia as late effect of cerebrovascular disease    pts wife states pt has to eat soft foods   . Elevated liver enzymes 08/10/2016  . GERD (gastroesophageal reflux disease)   . Glaucoma   . High cholesterol   . History of CVA with residual deficit 03/25/2013  . Hypertension   . Hypertensive retinopathy of both eyes 01/16/2017  . Inguinal hernia 03/25/2013  . Liver hemangioma 08/14/2016  . Neuromuscular disorder ([place])    chronic inflammatory demyelinating polyneuropathy   . New onset seizure ([place]) 07/08/2017   seizure 07/14/18  . Nuclear sclerosis of both eyes 10/25/2016  . Presence of permanent cardiac pacemaker    placed in april 2018  . Primary open angle glaucoma of both eyes, indeterminate stage 10/25/2016  . Renal mass, right 08/14/2016  . Status cardiac pacemaker 01/29/2017   Placed for second degree heart block on 01/16/17 Medtronic Azure XT DR MRI SureScan dual-chamber pacemaker  . Stroke [facility])    2011 with residual deficit left sided weakness  . Tobacco dependence    Past Surgical History:  Procedure Laterality Date  . EYE SURGERY    . HERNIA REPAIR    . INGUINAL HERNIA REPAIR Right 11/23/2014   Procedure: right inguinal hernia repair with mesh;  Surgeon: Armandina Gemma, MD;  [facility];  Service: General;  Laterality: Right;  . INSERTION OF MESH N/A 11/23/2014   Procedure:  INSERTION OF MESH;  Surgeon: Armandina Gemma, MD;  [facility];  Service: General;  Laterality: N/A;  . MASS EXCISION Left 08/29/2017   Procedure: EXCISION OF LEFT NECK MASS;  Surgeon: Coralie Keens, MD;  Location: [place];  Service: General;  Laterality: Left;  . PACEMAKER IMPLANT N/A 01/16/2017   Procedure: Pacemaker Implant;  Surgeon: Lucindia Lemley Meredith Leeds, MD;  Location: [place] [facility];  Service: Cardiovascular;  Laterality: N/A;  . SHOULDER SURGERY Bilateral 1988, 1998     Current Outpatient Medications  Medication Sig Dispense Refill  . albuterol (VENTOLIN HFA) 108 (90 Base) MCG/ACT inhaler Inhale 1-2 puffs into the lungs every 6 (six) hours as needed for wheezing or shortness of breath. 18 g 2  . aspirin EC 81 MG tablet Take 1 tablet (81 mg total) by mouth daily. 90 tablet 3  . atorvastatin (LIPITOR) 40 MG tablet Take 1 tablet (40 mg total) by mouth daily. 90 tablet 1  . azithromycin (ZITHROMAX) 500 MG tablet Take by mouth daily. 12/13/20 take 1 tab every Mon, Wed, Fri    . brimonidine (ALPHAGAN P) 0.1 % SOLN Place 1 drop into both eyes daily.     . cetirizine (ZYRTEC) 10 MG tablet Take 1 tablet (10 mg total) by mouth daily. 90 tablet 1  . dicyclomine (BENTYL) 10 MG capsule Take 1 capsule (10 mg total) by mouth in the morning and at bedtime. 60 capsule 2  . dorzolamide-timolol (COSOPT) 22.3-6.8 MG/ML ophthalmic solution Place 1 drop into both eyes daily.     . Ferrous Sulfate (IRON) 325 (65 Fe) MG TABS TAKE 1 TABLET BY MOUTH TWICE DAILY WITH MEALS 60 tablet 0  . fluticasone (FLONASE) 50 MCG/ACT nasal spray Place 2 sprays into both nostrils daily. 16 g 3  . guaiFENesin-dextromethorphan (ROBITUSSIN DM)  100-10 MG/5ML syrup Take 5 mLs by mouth every 6 (six) hours as needed for cough. 236 mL 0  . hydrOXYzine (ATARAX/VISTARIL) 25 MG tablet Take 1 tablet (25 mg total) by mouth at bedtime. 30 tablet 1  . latanoprost (XALATAN) 0.005 % ophthalmic solution Place 1 drop into both eyes at bedtime.     . levETIRAcetam (KEPPRA) 500 MG tablet Take 1 tablet (500 mg total) by mouth 2 (two) times daily. 180 tablet 4  . meloxicam (MOBIC) 15 MG tablet Take 1 tablet (15 mg total) by mouth daily. 30 tablet 1  . metoprolol tartrate (LOPRESSOR) 50 MG tablet Take 1 tablet by mouth twice daily 180 tablet 3  . Misc. Devices MISC Yankauer suction. Dx: ALS 1 each 0  . Misc. Devices MISC Coughalator 1 each 0  . Misc. Devices MISC Nebulizer machine.  Diagnosis- Motor neuron disease 1 each 0  . Misc. Devices [place] [facility] Bed DXg12.20 1 each 0  . Misc. [facility] bed extension. Diagnosis: ALS. Height 6'4" 1 each 0  . Multiple Vitamin (MULTI-VITAMIN) tablet Take by mouth.    . Multiple Vitamins-Iron (DAILY VITAMIN FORMULA+IRON) TABS Take 1 tablet by mouth daily.    . tamsulosin (FLOMAX) 0.4 MG CAPS capsule Take 1 capsule (0.4 mg total) by mouth daily. 90 capsule 1   No current facility-administered medications for this visit.    Allergies:   Ace inhibitors, Doxycycline, Atacand hct [candesartan cilexetil-hctz], and Shellfish allergy   Social History:  The patient  reports that he has been smoking cigarettes. He has a 40.00 pack-year smoking history. He has never used smokeless tobacco. He reports that he does not drink alcohol and does not use drugs.   Family History:  The patient's family history includes COPD in his sister; Cancer in his sister; Diabetes in his sister; Hypertension in his mother and sister.   ROS:  Please see the history of present illness.   Otherwise, review of systems is positive for none.   All other systems are reviewed and negative.   PHYSICAL EXAM: VS:  BP (!) 142/76   Pulse 78   Ht 6' 3.5" (1.918 m)   Wt 170 lb (77.1 kg)   BMI 20.97 kg/m  , BMI Body mass index is 20.97 kg/m. GEN: Well nourished, well developed, in no acute distress  HEENT: normal  Neck: no JVD, carotid bruits, or masses Cardiac: RRR; no murmurs, rubs, or gallops,no edema   Respiratory:  clear to auscultation bilaterally, normal work of breathing GI: soft, nontender, nondistended, + BS MS: no deformity or atrophy  Skin: warm and dry, device site well healed Neuro:  Strength and sensation are intact Psych: euthymic mood, full affect  EKG:  EKG is ordered today. Personal review of the ekg ordered shows sinus rhythm, ventricular paced, rate 78, PVC  Personal review of the device interrogation today. Results in [place]: 03/31/2020: ALT 11; BUN 24; Creatinine, Ser 2.09; Hemoglobin 10.2; Platelets 227; Potassium 5.1; Sodium 142    Lipid Panel     Component Value Date/Time   CHOL 112 06/13/2018 0954   TRIG 51 06/13/2018 0954   HDL 40 06/13/2018 0954   CHOLHDL 2.8 06/13/2018 0954   CHOLHDL 4.1 08/16/2015 1555   VLDL 23 08/16/2015 1555   LDLCALC 62 06/13/2018 0954     Wt Readings from Last 3 Encounters:  01/17/21 170 lb (77.1 kg)  07/22/19 165 lb (74.8 kg)  05/20/19 162 lb (73.5 kg)  Other studies Reviewed: Additional studies/ records that were reviewed today include: TTE 2014  Review of the above records today demonstrates:  - Left ventricle: The cavity size was normal. There was severe concentric and mild asymmetric hypertrophy. Systolic function was normal. Wall motion was normal; there were no regional wall motion abnormalities. Doppler parameters are consistent with abnormal left ventricular relaxation (grade 1 diastolic dysfunction). - Aortic root: The aortic root was mildly dilated. - Right atrium: The atrium was mildly dilated. - Pulmonary arteries: Systolic pressure was mildly increased. PA peak pressure: 55mm Hg (S).  ASSESSMENT AND PLAN:  1.  2-1 second-degree AV block: Status post Medtronic dual-chamber pacemaker.  Device functioning appropriately.  No changes at this time.    2.  Hypertension: Mildly elevated today.  Has been well controlled in the past.  No changes.  3.  PVCs: 1 PVC on ECG today.   We Deosha Werden continue to monitor.  If PVC burden increases, Maranatha Grossi likely plan for further monitoring.  Current medicines are reviewed at length with the patient today.   The patient does not have concerns regarding his medicines.  The following changes were made today: none  Labs/ tests ordered today include:  Orders Placed This Encounter  Procedures  . EKG 12-Lead     Disposition:   FU with Luqman Perrelli 12 months  Signed, Valeen Borys Meredith Leeds, MD  01/17/2021 2:24 PM     [address] [place] [address] [phone] (office) [phone] (fax)

## 2021-01-18 ENCOUNTER — Other Ambulatory Visit: Payer: Self-pay

## 2021-01-18 DIAGNOSIS — Z720 Tobacco use: Secondary | ICD-10-CM | POA: Diagnosis not present

## 2021-01-18 DIAGNOSIS — J42 Unspecified chronic bronchitis: Secondary | ICD-10-CM | POA: Diagnosis not present

## 2021-01-18 MED ORDER — ALBUTEROL SULFATE HFA 108 (90 BASE) MCG/ACT IN AERS
INHALATION_SPRAY | RESPIRATORY_TRACT | 5 refills | Status: DC
  Filled 2021-01-18: qty 18, 25d supply, fill #0

## 2021-01-18 MED ORDER — AZITHROMYCIN 500 MG PO TABS
ORAL_TABLET | ORAL | 3 refills | Status: DC
  Filled 2021-01-18: qty 36, 84d supply, fill #0

## 2021-01-22 DIAGNOSIS — G8929 Other chronic pain: Secondary | ICD-10-CM | POA: Diagnosis not present

## 2021-01-22 DIAGNOSIS — D649 Anemia, unspecified: Secondary | ICD-10-CM | POA: Diagnosis not present

## 2021-01-22 DIAGNOSIS — E785 Hyperlipidemia, unspecified: Secondary | ICD-10-CM | POA: Diagnosis not present

## 2021-01-22 DIAGNOSIS — J42 Unspecified chronic bronchitis: Secondary | ICD-10-CM | POA: Diagnosis not present

## 2021-01-22 DIAGNOSIS — I69354 Hemiplegia and hemiparesis following cerebral infarction affecting left non-dominant side: Secondary | ICD-10-CM | POA: Diagnosis not present

## 2021-01-22 DIAGNOSIS — G4700 Insomnia, unspecified: Secondary | ICD-10-CM | POA: Diagnosis not present

## 2021-01-22 DIAGNOSIS — G40909 Epilepsy, unspecified, not intractable, without status epilepticus: Secondary | ICD-10-CM | POA: Diagnosis not present

## 2021-01-22 DIAGNOSIS — R69 Illness, unspecified: Secondary | ICD-10-CM | POA: Diagnosis not present

## 2021-01-22 DIAGNOSIS — G1221 Amyotrophic lateral sclerosis: Secondary | ICD-10-CM | POA: Diagnosis not present

## 2021-01-25 ENCOUNTER — Other Ambulatory Visit: Payer: Self-pay

## 2021-01-26 DIAGNOSIS — G1223 Primary lateral sclerosis: Secondary | ICD-10-CM | POA: Diagnosis not present

## 2021-01-26 DIAGNOSIS — M898X6 Other specified disorders of bone, lower leg: Secondary | ICD-10-CM | POA: Diagnosis not present

## 2021-01-26 DIAGNOSIS — R252 Cramp and spasm: Secondary | ICD-10-CM | POA: Diagnosis not present

## 2021-01-26 DIAGNOSIS — I69334 Monoplegia of upper limb following cerebral infarction affecting left non-dominant side: Secondary | ICD-10-CM | POA: Diagnosis not present

## 2021-01-26 DIAGNOSIS — Z79899 Other long term (current) drug therapy: Secondary | ICD-10-CM | POA: Diagnosis not present

## 2021-01-26 DIAGNOSIS — M8580 Other specified disorders of bone density and structure, unspecified site: Secondary | ICD-10-CM | POA: Diagnosis not present

## 2021-01-26 DIAGNOSIS — G8929 Other chronic pain: Secondary | ICD-10-CM | POA: Diagnosis not present

## 2021-01-26 DIAGNOSIS — R1310 Dysphagia, unspecified: Secondary | ICD-10-CM | POA: Diagnosis not present

## 2021-01-26 DIAGNOSIS — M25572 Pain in left ankle and joints of left foot: Secondary | ICD-10-CM | POA: Diagnosis not present

## 2021-01-26 DIAGNOSIS — R471 Dysarthria and anarthria: Secondary | ICD-10-CM | POA: Diagnosis not present

## 2021-02-02 ENCOUNTER — Ambulatory Visit: Payer: Medicare HMO | Admitting: Podiatry

## 2021-02-07 ENCOUNTER — Ambulatory Visit: Payer: Medicare HMO | Admitting: Podiatry

## 2021-02-08 ENCOUNTER — Other Ambulatory Visit: Payer: Self-pay | Admitting: Family Medicine

## 2021-02-08 DIAGNOSIS — N184 Chronic kidney disease, stage 4 (severe): Secondary | ICD-10-CM

## 2021-02-14 ENCOUNTER — Other Ambulatory Visit: Payer: Self-pay | Admitting: Family Medicine

## 2021-02-14 DIAGNOSIS — N184 Chronic kidney disease, stage 4 (severe): Secondary | ICD-10-CM

## 2021-02-14 NOTE — Telephone Encounter (Signed)
S[facility] called and spoke to [place], [employer] about the refill requested. Advised it was sent on 02/08/21 #60/0 refills. She says the patient picked it up and that this request must have been sent prior to him picking up. Request refused at this time.

## 2021-02-22 ENCOUNTER — Ambulatory Visit: Payer: Medicare HMO | Admitting: Podiatry

## 2021-02-22 DIAGNOSIS — G1221 Amyotrophic lateral sclerosis: Secondary | ICD-10-CM | POA: Diagnosis not present

## 2021-03-02 ENCOUNTER — Ambulatory Visit: Payer: Medicare HMO | Attending: Family Medicine | Admitting: Family Medicine

## 2021-03-02 ENCOUNTER — Other Ambulatory Visit: Payer: Self-pay

## 2021-03-02 VITALS — BP 162/93 | HR 59 | Ht 75.0 in | Wt 166.0 lb

## 2021-03-02 DIAGNOSIS — G40909 Epilepsy, unspecified, not intractable, without status epilepticus: Secondary | ICD-10-CM

## 2021-03-02 DIAGNOSIS — J42 Unspecified chronic bronchitis: Secondary | ICD-10-CM | POA: Diagnosis not present

## 2021-03-02 DIAGNOSIS — H5450 Low vision, one eye, unspecified eye: Secondary | ICD-10-CM

## 2021-03-02 DIAGNOSIS — Z0000 Encounter for general adult medical examination without abnormal findings: Secondary | ICD-10-CM

## 2021-03-02 DIAGNOSIS — G1221 Amyotrophic lateral sclerosis: Secondary | ICD-10-CM

## 2021-03-02 DIAGNOSIS — R69 Illness, unspecified: Secondary | ICD-10-CM | POA: Diagnosis not present

## 2021-03-02 DIAGNOSIS — R532 Functional quadriplegia: Secondary | ICD-10-CM | POA: Diagnosis not present

## 2021-03-02 DIAGNOSIS — H5440 Blindness, one eye, unspecified eye: Secondary | ICD-10-CM | POA: Diagnosis not present

## 2021-03-02 DIAGNOSIS — N184 Chronic kidney disease, stage 4 (severe): Secondary | ICD-10-CM

## 2021-03-02 DIAGNOSIS — F17210 Nicotine dependence, cigarettes, uncomplicated: Secondary | ICD-10-CM | POA: Diagnosis not present

## 2021-03-02 NOTE — Patient Instructions (Signed)
    Darrell [name] , Thank you for taking time to come for your Medicare Wellness Visit. I appreciate your ongoing commitment to your health goals. Please review the following plan we discussed and let me know if I can assist you in the future.   These are the goals we discussed: Goals    . Blood Pressure < 130/80    . Quit smoking / using tobacco                      Social Work Referral placed to determine needs and eligibility for community services     This is a list of the screening recommended for you and due dates:  Health Maintenance  Topic Date Due  . COVID-19 Vaccine (1) Never done  . Pneumococcal Vaccination (1 of 4 - PCV13) Never done  . Colon Cancer Screening  Never done  . Zoster (Shingles) Vaccine (1 of 2) Never done  . Pneumonia vaccines (2 of 2 - PPSV23) 09/22/2019  . Flu Shot  04/25/2021  . Tetanus Vaccine  11/24/2025  . Hepatitis C Screening: USPSTF Recommendation to screen - Ages 55-79 yo.  Completed  . HPV Vaccine  Aged Out

## 2021-03-02 NOTE — Progress Notes (Deleted)
Subjective:   Darrell [name] is a 70 y.o. male who presents for Medicare Annual/Subsequent preventive examination.  Review of Systems    ***       Objective:    There were no vitals filed for this visit. There is no height or weight on file to calculate BMI.  Advanced Directives 10/29/2018 10/29/2018 07/14/2018 08/29/2017 08/06/2017 08/01/2017 07/23/2017  Does Patient Have a Medical Advance Directive? No No No No No No No  Would patient like information on creating a medical advance directive? No - Patient declined No - Patient declined No - Patient declined Yes (MAU/Ambulatory/Procedural Areas - Information given) No - Patient declined - -    Current Medications (verified) Outpatient Encounter Medications as of 03/02/2021  Medication Sig  . albuterol (VENTOLIN HFA) 108 (90 Base) MCG/ACT inhaler Inhale 1-2 puffs into the lungs every 6 (six) hours as needed for wheezing or shortness of breath.  Marland Kitchen albuterol (VENTOLIN HFA) 108 (90 Base) MCG/ACT inhaler Inhale 2 puffs into the lungs every 6 (six) hours as needed for up to 30 days for Wheezing.  Marland Kitchen aspirin EC 81 MG tablet Take 1 tablet (81 mg total) by mouth daily.  Marland Kitchen atorvastatin (LIPITOR) 40 MG tablet Take 1 tablet (40 mg total) by mouth daily.  Marland Kitchen azithromycin (ZITHROMAX) 500 MG tablet Take by mouth daily. 12/13/20 take 1 tab every Mon, Wed, Fri  . azithromycin (ZITHROMAX) 500 MG tablet Take 1 tablet (500 mg total) by mouth 3 (three) times a week.  . brimonidine (ALPHAGAN P) 0.1 % SOLN Place 1 drop into both eyes daily.   . cetirizine (ZYRTEC) 10 MG tablet Take 1 tablet (10 mg total) by mouth daily.  Marland Kitchen dicyclomine (BENTYL) 10 MG capsule Take 1 capsule (10 mg total) by mouth in the morning and at bedtime.  . dorzolamide-timolol (COSOPT) 22.3-6.8 MG/ML ophthalmic solution Place 1 drop into both eyes daily.   . FEROSUL 325 (65 Fe) MG tablet TAKE 1 TABLET BY MOUTH TWICE DAILY WITH A MEAL  . fluticasone (FLONASE) 50 MCG/ACT nasal spray Place 2  sprays into both nostrils daily.  Marland Kitchen guaiFENesin-dextromethorphan (ROBITUSSIN DM) 100-10 MG/5ML syrup Take 5 mLs by mouth every 6 (six) hours as needed for cough.  . hydrOXYzine (ATARAX/VISTARIL) 25 MG tablet Take 1 tablet (25 mg total) by mouth at bedtime.  Marland Kitchen latanoprost (XALATAN) 0.005 % ophthalmic solution Place 1 drop into both eyes at bedtime.  . levETIRAcetam (KEPPRA) 500 MG tablet Take 1 tablet (500 mg total) by mouth 2 (two) times daily.  . meloxicam (MOBIC) 15 MG tablet Take 1 tablet (15 mg total) by mouth daily.  . metoprolol tartrate (LOPRESSOR) 50 MG tablet Take 1 tablet by mouth twice daily  . Misc. Devices MISC Yankauer suction. Dx: ALS  . Misc. Devices MISC Coughalator  . Misc. Devices MISC Nebulizer machine.  Diagnosis- Motor neuron disease  . Misc. Devices [place] [facility] Bed [phone]  . Misc. [facility] bed extension. Diagnosis: ALS. Height 6'4"  . Multiple Vitamin (MULTI-VITAMIN) tablet Take by mouth.  . Multiple Vitamins-Iron (DAILY VITAMIN FORMULA+IRON) TABS Take 1 tablet by mouth daily.  . tamsulosin (FLOMAX) 0.4 MG CAPS capsule Take 1 capsule (0.4 mg total) by mouth daily.   No facility-administered encounter medications on file as of 03/02/2021.    Allergies (verified) Ace inhibitors, Doxycycline, Atacand hct [candesartan cilexetil-hctz], and Shellfish allergy   History: Past Medical History:  Diagnosis Date  . Arthritis   . At high risk for  falls 08/16/2015  . Bradycardia   . Chronic kidney disease    stage 3 GFR 30-59 ml/min   . CIDP (chronic inflammatory demyelinating polyneuropathy) ([place])   . CKD (chronic kidney disease) stage 3, GFR 30-59 ml/min ([name]) 08/16/2015  . Dysphagia as late effect of cerebrovascular disease    pts wife states pt has to eat soft foods   . Elevated liver enzymes 08/10/2016  . GERD (gastroesophageal reflux disease)   . Glaucoma   . High cholesterol   . History of CVA with residual deficit 03/25/2013  .  Hypertension   . Hypertensive retinopathy of both eyes 01/16/2017  . Inguinal hernia 03/25/2013  . Liver hemangioma 08/14/2016  . Neuromuscular disorder ([place])    chronic inflammatory demyelinating polyneuropathy   . New onset seizure ([place]) 07/08/2017   seizure 07/14/18  . Nuclear sclerosis of both eyes 10/25/2016  . Presence of permanent cardiac pacemaker    placed in april 2018  . Primary open angle glaucoma of both eyes, indeterminate stage 10/25/2016  . Renal mass, right 08/14/2016  . Status cardiac pacemaker 01/29/2017   Placed for second degree heart block on 01/16/17 Medtronic Azure XT DR MRI SureScan dual-chamber pacemaker  . Stroke [facility])    2011 with residual deficit left sided weakness  . Tobacco dependence    Past Surgical History:  Procedure Laterality Date  . EYE SURGERY    . HERNIA REPAIR    . INGUINAL HERNIA REPAIR Right 11/23/2014   Procedure: right inguinal hernia repair with mesh;  Surgeon: Armandina Gemma, MD;  Location: [facility];  Service: General;  Laterality: Right;  . INSERTION OF MESH N/A 11/23/2014   Procedure: INSERTION OF MESH;  Surgeon: Armandina Gemma, MD;  Location: [facility];  Service: General;  Laterality: N/A;  . MASS EXCISION Left 08/29/2017   Procedure: EXCISION OF LEFT NECK MASS;  Surgeon: Coralie Keens, MD;  Location: [place];  Service: General;  Laterality: Left;  . PACEMAKER IMPLANT N/A 01/16/2017   Procedure: Pacemaker Implant;  Surgeon: Will Meredith Leeds, MD;  Location: [place] [facility];  Service: Cardiovascular;  Laterality: N/A;  . SHOULDER SURGERY Bilateral 1988, 1998   Family History  Problem Relation Age of Onset  . Hypertension Mother   . Diabetes Sister   . Hypertension Sister   . Cancer Sister        1 sister  . COPD Sister        in 1 sister  . Stomach cancer Neg Hx   . Colon cancer Neg Hx   . Pancreatic cancer Neg Hx   . Esophageal cancer Neg Hx    Social History   Socioeconomic History  . Marital status: Married    Spouse name:  [name]  . Number of children: 1  . Years of education: college  . Highest education level: Not on file  Occupational History  . Occupation: retired  Tobacco Use  . Smoking status: Current Every Day Smoker    Packs/day: 1.00    Years: 40.00    Pack years: 40.00    Types: Cigarettes  . Smokeless tobacco: Never Used  . Tobacco comment: 12/13/20 5 a day  Vaping Use  . Vaping Use: Never used  Substance and Sexual Activity  . Alcohol use: No    Comment: alcohol free for 1 year was drinking 1/2 pint per day   . Drug use: No  . Sexual activity: Not on file  Other Topics Concern  . Not on file  Social History Narrative   09/13/17 Patient lives at home with spouse.   Has 37 yo daughter   No grandchildren   Social Determinants of Radio broadcast assistant Strain: Not on file  Food Insecurity: Not on file  Transportation Needs: Not on file  Physical Activity: Not on file  Stress: Not on file  Social Connections: Not on file    Tobacco Counseling Ready to quit: Not Answered Counseling given: Not Answered Comment: 12/13/20 5 a day   Clinical Intake:                 Diabetic?***         Activities of Daily Living No flowsheet data found.  Patient Care Team: [name], MD as PCP - General (Family Medicine) Constance Haw, MD as PCP - Cardiology (Cardiology) Penni Bombard, MD as Consulting Physician (Neurology)  Indicate any recent Medical Services you may have received from other than [facility] providers in the past year (date may be approximate).     Assessment:   This is a routine wellness examination for Gurf[place]h.  Hearing/Vision screen No exam data present  Dietary issues and exercise activities discussed:    Goals Addressed   None    Depression Screen PHQ 2/9 Scores 03/31/2020 10/24/2019 09/12/2018 07/16/2018 06/13/2018 03/07/2018 09/07/2017  PHQ - 2 Score 4 0 2 1 1  0 0  PHQ- 9 Score 13 - 10 11 10 5 4     Fall Risk Fall Risk   08/02/2020 03/31/2020 11/25/2019 08/20/2019 05/20/2019  Falls in the past year? 0 0 0 0 0  Number falls in past yr: 0 - - - -  Injury with Fall? - - - - -  Risk Factor Category  - - - - -  Risk for fall due to : - No Fall Risks - - -  Risk for fall due to: Comment - - - - -  Follow up - - - - -    FALL [place]:  Any stairs in or around the home? {YES/NO:21197} If so, are there any without handrails? {YES/NO:21197} Home free of loose throw rugs in walkways, pet beds, electrical cords, etc? {YES/NO:21197} Adequate lighting in your home to reduce risk of falls? {YES/NO:21197}  ASSISTIVE DEVICES UTILIZED TO PREVENT FALLS:  Life alert? {YES/NO:21197} Use of a cane, walker or w/c? {YES/NO:21197} Grab bars in the bathroom? {YES/NO:21197} Shower chair or bench in shower? {YES/NO:21197} Elevated toilet seat or a handicapped toilet? {YES/NO:21197}  TIMED UP AND GO:  Was the test performed? {YES/NO:21197}.  Length of time to ambulate 10 feet: *** sec.   {Appearance of MOLM:7867544}  Cognitive Function:        Immunizations Immunization History  Administered Date(s) Administered  . Influenza,inj,Quad PF,6+ Mos 09/21/2014, 08/16/2015, 05/25/2016, 06/13/2018, 08/20/2019  . Pneumococcal Conjugate-13 09/12/2018  . Pneumococcal Polysaccharide-23 09/21/2014  . Tdap 11/25/2015    {TDAP status:2101805}  {Flu Vaccine status:2101806}  {Pneumococcal vaccine status:2101807}  {Covid-19 vaccine status:2101808}  Qualifies for Shingles Vaccine? {YES/NO:21197}  Zostavax completed {YES/NO:21197}  {Shingrix Completed?:2101804}  Screening Tests Health Maintenance  Topic Date Due  . COVID-19 Vaccine (1) Never done  . Pneumococcal Vaccine 69-47 Years old (1 of 4 - PCV13) Never done  . COLONOSCOPY (Pts 45-28yrs Insurance coverage will need to be confirmed)  Never done  . Zoster Vaccines- Shingrix (1 of 2) Never done  . PNA vac Low Risk Adult (2 of 2 - PPSV23)  09/22/2019  . INFLUENZA VACCINE  04/25/2021  . TETANUS/TDAP  11/24/2025  . Hepatitis C Screening  Completed  . HPV VACCINES  Aged Out    Health Maintenance  Health Maintenance Due  Topic Date Due  . COVID-19 Vaccine (1) Never done  . Pneumococcal Vaccine 63-32 Years old (1 of 4 - PCV13) Never done  . COLONOSCOPY (Pts 45-74yrs Insurance coverage will need to be confirmed)  Never done  . Zoster Vaccines- Shingrix (1 of 2) Never done  . PNA vac Low Risk Adult (2 of 2 - PPSV23) 09/22/2019    {Colorectal cancer screening:2101809}  Lung Cancer Screening: (Low Dose CT Chest recommended if Age 63-80 years, 30 pack-year currently smoking OR have quit w/in 15years.) {DOES NOT does:27190::"does not"} qualify.   Lung Cancer Screening Referral: ***  Additional Screening:  Hepatitis C Screening: {DOES NOT does:27190::"does not"} qualify; Completed ***  Vision Screening: Recommended annual ophthalmology exams for early detection of glaucoma and other disorders of the eye. Is the patient up to date with their annual eye exam?  {YES/NO:21197} Who is the provider or what is the name of the office in which the patient attends annual eye exams? *** If pt is not established with a provider, would they like to be referred to a provider to establish care? {YES/NO:21197}.   Dental Screening: Recommended annual dental exams for proper oral hygiene  Community Resource Referral / Chronic Care Management: CRR required this visit?  {YES/NO:21197}  CCM required this visit?  {YES/NO:21197}     Plan:     I have personally reviewed and noted the following in the patient's chart:   . Medical and social history . Use of alcohol, tobacco or illicit drugs  . Current medications and supplements including opioid prescriptions. {Opioid Prescriptions:[phone]} . Functional ability and status . Nutritional status . Physical activity . Advanced directives . List of other physicians . Hospitalizations,  surgeries, and ER visits in previous 12 months . Vitals . Screenings to include cognitive, depression, and falls . Referrals and appointments  In addition, I have reviewed and discussed with patient certain preventive protocols, quality metrics, and best practice recommendations. A written personalized care plan for preventive services as well as general preventive health recommendations were provided to patient.     Molli Barrows, FNP   03/02/2021   Nurse Notes: ***

## 2021-03-09 DIAGNOSIS — N184 Chronic kidney disease, stage 4 (severe): Secondary | ICD-10-CM | POA: Insufficient documentation

## 2021-03-09 NOTE — Progress Notes (Signed)
Subjective:   Darrell [name] is a 70 y.o. male who presents for Medicare Annual/Subsequent preventive health visit.    Active medical problems: Darrell [name] has ALS -amyotrophic lateral sclerosis, HTN (hypertension); Paraplegia,Tobacco abuse; History of CVA with residual deficit; CKD (chronic kidney disease) stage 3, GFR 30-59 ml/min ([name]); At high risk for falls; Subcutaneous nodules; Elevated liver enzymes; Liver hemangioma; Renal cyst; Renal mass, right; Glaucoma; Hypercalcemia; Heart block; Status cardiac pacemaker; Unintended weight loss; Seizures (Darrell [name]); Hypertensive retinopathy of both eyes; Primary open angle glaucoma of both eyes, indeterminate stage; GERD (gastroesophageal reflux disease); Hyperlipidemia; Dysarthria; Dysphagia; Gait abnormality; Spasticity; 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; Cognitive communication deficit; and  problem list.   Review of Systems    Pertinent negatives listed in HPI        Objective:    Today's Vitals   03/02/21 1004  BP: (!) 162/93  Pulse: (!) 59  Weight: 166 lb (75.3 kg)  Height: 6\' 3"  (1.905 m)   Body mass index is 20.75 kg/m.  Advanced Directives 10/29/2018 10/29/2018 07/14/2018 08/29/2017 08/06/2017 08/01/2017 07/23/2017  Does Patient Have a Medical Advance Directive? No No No No No No No  Would patient like information on creating a medical advance directive? No - Patient declined No - Patient declined No - Patient declined Yes (MAU/Ambulatory/Procedural Areas - Information given) No - Patient declined - -    Current Medications (verified) Outpatient Encounter Medications as of 03/02/2021  Medication Sig   albuterol (VENTOLIN HFA) 108 (90 Base) MCG/ACT inhaler Inhale 1-2 puffs into the lungs every 6 (six) hours as needed for wheezing or shortness of breath.   albuterol (VENTOLIN HFA) 108 (90 Base) MCG/ACT inhaler Inhale 2 puffs into the lungs every 6 (six) hours as needed for up to 30 days for Wheezing.   aspirin EC 81 MG tablet Take 1  tablet (81 mg total) by mouth daily.   atorvastatin (LIPITOR) 40 MG tablet Take 1 tablet (40 mg total) by mouth daily.   azithromycin (ZITHROMAX) 500 MG tablet Take by mouth daily. 12/13/20 take 1 tab every Mon, Wed, Fri   azithromycin (ZITHROMAX) 500 MG tablet Take 1 tablet (500 mg total) by mouth 3 (three) times a week.   brimonidine (ALPHAGAN P) 0.1 % SOLN Place 1 drop into both eyes daily.    cetirizine (ZYRTEC) 10 MG tablet Take 1 tablet (10 mg total) by mouth daily.   dicyclomine (BENTYL) 10 MG capsule Take 1 capsule (10 mg total) by mouth in the morning and at bedtime.   dorzolamide-timolol (COSOPT) 22.3-6.8 MG/ML ophthalmic solution Place 1 drop into both eyes daily.    FEROSUL 325 (65 Fe) MG tablet TAKE 1 TABLET BY MOUTH TWICE DAILY WITH A MEAL   fluticasone (FLONASE) 50 MCG/ACT nasal spray Place 2 sprays into both nostrils daily.   guaiFENesin-dextromethorphan (ROBITUSSIN DM) 100-10 MG/5ML syrup Take 5 mLs by mouth every 6 (six) hours as needed for cough.   hydrOXYzine (ATARAX/VISTARIL) 25 MG tablet Take 1 tablet (25 mg total) by mouth at bedtime.   latanoprost (XALATAN) 0.005 % ophthalmic solution Place 1 drop into both eyes at bedtime.   levETIRAcetam (KEPPRA) 500 MG tablet Take 1 tablet (500 mg total) by mouth 2 (two) times daily.   meloxicam (MOBIC) 15 MG tablet Take 1 tablet (15 mg total) by mouth daily.   metoprolol tartrate (LOPRESSOR) 50 MG tablet Take 1 tablet by mouth twice daily   Misc. Devices MISC Yankauer suction. Dx: ALS   Misc. Devices MISC  Coughalator   Misc. Devices MISC Nebulizer machine.  Diagnosis- Motor neuron disease   Misc. Devices [name] Hospital Bed DXg12.20   Misc. [name] Hospital bed extension. Diagnosis: ALS. Height 6'4"   Multiple Vitamin (MULTI-VITAMIN) tablet Take by mouth.   Multiple Vitamins-Iron (DAILY VITAMIN FORMULA+IRON) TABS Take 1 tablet by mouth daily.   tamsulosin (FLOMAX) 0.4 MG CAPS capsule Take 1 capsule (0.4 mg total) by  mouth daily.   No facility-administered encounter medications on file as of 03/02/2021.    Allergies (verified) Ace inhibitors, Doxycycline, Atacand hct [candesartan cilexetil-hctz], and Shellfish allergy   History: Past Medical History:  Diagnosis Date   Arthritis    At high risk for falls 08/16/2015   Bradycardia    Chronic kidney disease    stage 3 GFR 30-59 ml/min    CIDP (chronic inflammatory demyelinating polyneuropathy) ([name])    CKD (chronic kidney disease) stage 3, GFR 30-59 ml/min ([name]) 08/16/2015   Dysphagia as late effect of cerebrovascular disease    pts wife states pt has to eat soft foods    Elevated liver enzymes 08/10/2016   GERD (gastroesophageal reflux disease)    Glaucoma    High cholesterol    History of CVA with residual deficit 03/25/2013   Hypertension    Hypertensive retinopathy of both eyes 01/16/2017   Inguinal hernia 03/25/2013   Liver hemangioma 08/14/2016   Neuromuscular disorder ([name])    chronic inflammatory demyelinating polyneuropathy    New onset seizure ([name]) 07/08/2017   seizure 07/14/18   Nuclear sclerosis of both eyes 10/25/2016   Presence of permanent cardiac pacemaker    placed in april 2018   Primary open angle glaucoma of both eyes, indeterminate stage 10/25/2016   Renal mass, right 08/14/2016   Status cardiac pacemaker 01/29/2017   Placed for second degree heart block on 01/16/17 Medtronic Azure XT DR MRI SureScan dual-chamber pacemaker   Stroke [facility])    2011 with residual deficit left sided weakness   Tobacco dependence    Past Surgical History:  Procedure Laterality Date   EYE SURGERY     HERNIA REPAIR     INGUINAL HERNIA REPAIR Right 11/23/2014   Procedure: right inguinal hernia repair with mesh;  Surgeon: Armandina Gemma, MD;  [facility];  Service: General;  Laterality: Right;   INSERTION OF MESH N/A 11/23/2014   Procedure: INSERTION OF MESH;  Surgeon: Armandina Gemma, MD;  [facility];  Service: General;  Laterality: N/A;   MASS  EXCISION Left 08/29/2017   Procedure: EXCISION OF LEFT NECK MASS;  Surgeon: Coralie Keens, MD;  Location: [place];  Service: General;  Laterality: Left;   PACEMAKER IMPLANT N/A 01/16/2017   Procedure: Pacemaker Implant;  Surgeon: Will Meredith Leeds, MD;  Location: [place] [facility];  Service: Cardiovascular;  Laterality: N/A;   SHOULDER SURGERY Bilateral 1988, 1998   Family History  Problem Relation Age of Onset   Hypertension Mother    Diabetes Sister    Hypertension Sister    Cancer Sister        1 sister   COPD Sister        in 1 sister   Stomach cancer Neg Hx    Colon cancer Neg Hx    Pancreatic cancer Neg Hx    Esophageal cancer Neg Hx    Social History   Socioeconomic History   Marital status: Married    Spouse name: [name]   Number of children: 1   Years of  education: college   Highest education level: Not on file  Occupational History   Occupation: retired  Tobacco Use   Smoking status: Every Day    Packs/day: 1.00    Years: 40.00    Pack years: 40.00    Types: Cigarettes   Smokeless tobacco: Never   Tobacco comments:    12/13/20 5 a day  Vaping Use   Vaping Use: Never used  Substance and Sexual Activity   Alcohol use: No    Comment: alcohol free for 1 year was drinking 1/2 pint per day    Drug use: No   Sexual activity: Not on file  Other Topics Concern   Not on file  Social History Narrative   09/13/17 Patient lives at home with spouse.   Has 47 yo daughter   No grandchildren   Social Determinants of Radio broadcast assistant Strain: Not on file  Food Insecurity: Not on file  Transportation Needs: Not on file  Physical Activity: Not on file  Stress: Not on file  Social Connections: Not on file    Tobacco Counseling Ready to quit: No Counseling given: Yes Tobacco comments: 12/13/20 5 a day   Activities of Daily Living No flowsheet data found.  Patient Care Team: [name], MD as PCP - General (Family Medicine) Constance Haw, MD as PCP - Cardiology (Cardiology) Penni Bombard, MD as Consulting Physician (Neurology)  Indicate any recent Medical Services you may have received from other than [facility] providers in the past year (date may be approximate).     Assessment:   This is a routine wellness examination for Darrell [name].   Dietary issues and exercise activities discussed: Immobile. Social work referral placed. Patient in need of in home services due to immobility and caregiver strain, spouse is the primary caregiver.      Goals Addressed   None    Depression Screen PHQ 2/9 Scores 03/31/2020 10/24/2019 09/12/2018 07/16/2018 06/13/2018 03/07/2018 09/07/2017  PHQ - 2 Score 4 0 2 1 1  0 0  PHQ- 9 Score 13 - 10 11 10 5 4     Fall Risk Fall Risk  08/02/2020 03/31/2020 11/25/2019 08/20/2019 05/20/2019  Falls in the past year? 0 0 0 0 0  Number falls in past yr: 0 - - - -  Injury with Fall? - - - - -  Risk Factor Category  - - - - -  Risk for fall due to : - No Fall Risks - - -  Risk for fall due to: Comment - - - - -  Follow up - - - - -    [place]: Referral placed for social work to assist with obtaining in home resources  ASSISTIVE DEVICES UTILIZED TO PREVENT FALLS: Inoperative lift in home   TIMED UP AND GO: Immobile, N/A   Cognitive Function:  Intact, speech impaired, memory and cognition intact.        Immunizations Immunization History  Administered Date(s) Administered   Influenza,inj,Quad PF,6+ Mos 09/21/2014, 08/16/2015, 05/25/2016, 06/13/2018, 08/20/2019   Pneumococcal Conjugate-13 09/12/2018   Pneumococcal Polysaccharide-23 09/21/2014   Tdap 11/25/2015   Screening Tests Health Maintenance  Topic Date Due   COVID-19 Vaccine (1) Never done   Zoster Vaccines- Shingrix (1 of 2) Never done   COLONOSCOPY (Pts 45-43yrs Insurance coverage will need to be confirmed)  Never done   PNA vac Low Risk Adult (2 of 2 - PPSV23) 09/22/2019   INFLUENZA VACCINE  04/25/2021   TETANUS/TDAP  11/24/2025   Hepatitis C Screening  Completed   HPV VACCINES  Aged Out    Health Maintenance  Health Maintenance Due  Topic Date Due   COVID-19 Vaccine (1) Never done   Zoster Vaccines- Shingrix (1 of 2) Never done   COLONOSCOPY (Pts 45-66yrs Insurance coverage will need to be confirmed)  Never done   PNA vac Low Risk Adult (2 of 2 - PPSV23) 09/22/2019    Colorectal cancer screening: No longer required.   Lung Cancer Screening: (Low Dose CT Chest recommended if Age 46-80 years, 30 pack-year currently smoking OR have quit w/in 15years.) does not qualify. Given active chronic disease status, screening is not medically warranted.    Plan:     I have personally reviewed and noted the following in the patient's chart:   Medical and social history Use of alcohol, tobacco or illicit drugs  Current medications and supplements including opioid prescriptions. Patient is not currently taking opioid prescriptions. Functional ability and status Nutritional status Physical activity Advanced directives List of other physicians Hospitalizations, surgeries, and ER visits in previous 12 months Vitals Screenings to include cognitive, depression, and falls Referrals and appointments  In addition, I have reviewed and discussed with patient certain preventive protocols, quality metrics, and best practice recommendations. A written personalized care plan for preventive services as well as general preventive health recommendations were provided to patient.     Molli Barrows, FNP   03/09/2021

## 2021-03-16 ENCOUNTER — Telehealth: Payer: Self-pay

## 2021-03-16 NOTE — Telephone Encounter (Signed)
Opened in error

## 2021-03-16 NOTE — Telephone Encounter (Signed)
Following up on a request from the patient last office visit  (03/02/21). Received from referral coordinator on 03/16/21. Verified patients full name and date of birth.   Contacted patient's spouse by phone for follow up on the request for community resources: in home health needs and transportation. Patient is immobile and partially blind.   Patient's spouse shared they were provided with information for [facility]ACE a few months ago, both her and the patient came down with COVID. Patient's spouse requested the number for PACE again and would call tomorrow.  Patient's spouse shared she has been his sole caregiver since the onset of his ALS in 2012. She mentioned it has been increasingly difficult to care for him the older they both get and is open to receiving assistance.   Case Manager suggested to the patient's spouse the benefit of the PACE program with the community services provided. Case Manager did share, if accepted into the program that [facility] medical providers would assume the patients primary care, no longer with Dr Margarita Rana being his primary.   Patient's spouse expressed their appreciation for the care provided by Dr. Margarita Rana and would miss her greatly but needed to look at the whole picture concerning his care.   Case Manager encouraged the patients wife to reach out to [facility] after speaking with [facility].

## 2021-03-21 ENCOUNTER — Ambulatory Visit: Payer: Medicare HMO | Admitting: Family Medicine

## 2021-03-22 ENCOUNTER — Ambulatory Visit (INDEPENDENT_AMBULATORY_CARE_PROVIDER_SITE_OTHER): Payer: Medicare HMO

## 2021-03-22 DIAGNOSIS — I441 Atrioventricular block, second degree: Secondary | ICD-10-CM

## 2021-03-24 ENCOUNTER — Other Ambulatory Visit: Payer: Self-pay | Admitting: Family Medicine

## 2021-03-24 DIAGNOSIS — G1221 Amyotrophic lateral sclerosis: Secondary | ICD-10-CM | POA: Diagnosis not present

## 2021-03-24 LAB — CUP PACEART REMOTE DEVICE CHECK
Battery Remaining Longevity: 84 mo
Battery Voltage: 2.97 V
Brady Statistic AP VP Percent: 80.08 %
Brady Statistic AP VS Percent: 1.52 %
Brady Statistic AS VP Percent: 8.19 %
Brady Statistic AS VS Percent: 10.21 %
Brady Statistic RA Percent Paced: 85.5 %
Brady Statistic RV Percent Paced: 88.27 %
Date Time Interrogation Session: 20220630002030
Implantable Lead Implant Date: 20180424
Implantable Lead Implant Date: 20180424
Implantable Lead Location: 753859
Implantable Lead Location: 753860
Implantable Lead Model: 5076
Implantable Lead Model: 5076
Implantable Pulse Generator Implant Date: 20180424
Lead Channel Impedance Value: 285 Ohm
Lead Channel Impedance Value: 304 Ohm
Lead Channel Impedance Value: 361 Ohm
Lead Channel Impedance Value: 399 Ohm
Lead Channel Pacing Threshold Amplitude: 0.5 V
Lead Channel Pacing Threshold Amplitude: 0.625 V
Lead Channel Pacing Threshold Pulse Width: 0.4 ms
Lead Channel Pacing Threshold Pulse Width: 0.4 ms
Lead Channel Sensing Intrinsic Amplitude: 1.375 mV
Lead Channel Sensing Intrinsic Amplitude: 1.375 mV
Lead Channel Sensing Intrinsic Amplitude: 11.875 mV
Lead Channel Sensing Intrinsic Amplitude: 11.875 mV
Lead Channel Setting Pacing Amplitude: 1.5 V
Lead Channel Setting Pacing Amplitude: 2.5 V
Lead Channel Setting Pacing Pulse Width: 0.4 ms
Lead Channel Setting Sensing Sensitivity: 2 mV

## 2021-03-24 NOTE — Telephone Encounter (Signed)
   Notes to clinic: script last filled on 10/09/2020 Patient should have been out in April Review for refill     Requested Prescriptions  Pending Prescriptions Disp Refills   tamsulosin (FLOMAX) 0.4 MG CAPS capsule [Pharmacy Med Name: Tamsulosin HCl 0.4 MG Oral Capsule] 90 capsule 0    Sig: Take 1 capsule by mouth once daily      Urology: Alpha-Adrenergic Blocker Failed - 03/24/2021 10:20 AM      Failed - Last BP in normal range    BP Readings from Last 1 Encounters:  03/02/21 (!) 162/93          Passed - Valid encounter within last 12 months    Recent Outpatient Visits           3 weeks ago Medicare annual wellness visit, subsequent   [place], Carroll Sage, FNP   2 months ago [place], [place], [place]   7 months ago Screening for colon cancer   [place], Charlane Ferretti, MD   9 months ago MND (motor neurone disease) [facility])   [place], Charlane Ferretti, MD   11 months ago Screening for colon cancer   [place], MD       Future Appointments             In 1 month [name], MD [place]

## 2021-03-29 ENCOUNTER — Ambulatory Visit (INDEPENDENT_AMBULATORY_CARE_PROVIDER_SITE_OTHER): Payer: Medicare HMO | Admitting: Podiatry

## 2021-03-29 ENCOUNTER — Other Ambulatory Visit: Payer: Self-pay

## 2021-03-29 ENCOUNTER — Encounter: Payer: Self-pay | Admitting: Podiatry

## 2021-03-29 DIAGNOSIS — N183 Chronic kidney disease, stage 3 unspecified: Secondary | ICD-10-CM

## 2021-03-29 DIAGNOSIS — M79674 Pain in right toe(s): Secondary | ICD-10-CM | POA: Diagnosis not present

## 2021-03-29 DIAGNOSIS — L97321 Non-pressure chronic ulcer of left ankle limited to breakdown of skin: Secondary | ICD-10-CM

## 2021-03-29 DIAGNOSIS — B351 Tinea unguium: Secondary | ICD-10-CM | POA: Diagnosis not present

## 2021-03-29 DIAGNOSIS — M79675 Pain in left toe(s): Secondary | ICD-10-CM

## 2021-03-29 DIAGNOSIS — I6930 Unspecified sequelae of cerebral infarction: Secondary | ICD-10-CM

## 2021-03-29 DIAGNOSIS — R7303 Prediabetes: Secondary | ICD-10-CM

## 2021-03-29 DIAGNOSIS — L97309 Non-pressure chronic ulcer of unspecified ankle with unspecified severity: Secondary | ICD-10-CM | POA: Insufficient documentation

## 2021-03-29 NOTE — Progress Notes (Signed)
This patient returns to my office for at risk foot care.  This patient requires this care by a professional since this patient will be at risk due to having CKD, history of CVA.  He was seen by Dr.  Amalia Hailey who diagnosed an ankle synovitis and treated with celesone injection.  He was told to use a pillow due to ulcer on outside lateral malleolar.  He says there is still pain to the outside left ankle without drainage from skin lesion.This patient is unable to cut nails himself since the patient cannot reach his nails.These nails are painful walking and wearing shoes.  This patient presents for at risk foot care today.  He presents with his wife and in a wheelchair.  General Appearance  Alert, conversant and in no acute stress.  Vascular  Dorsalis pedis and posterior tibial  pulses are palpable  bilaterally.  Capillary return is within normal limits  bilaterally. Temperature is within normal limits  bilaterally.  Neurologic  Senn-Weinstein monofilament wire test within normal limits  bilaterally. Muscle power within normal limits bilaterally.  Nails Thick disfigured discolored nails with subungual debris  from hallux to fifth toes bilaterally. No evidence of bacterial infection or drainage bilaterally.  Orthopedic  No limitations of motion  feet .  No crepitus or effusions noted.  No bony pathology or digital deformities noted. Inverted left foot.    Skin  normotropic skin with no porokeratosis noted bilaterally.  No signs of infections or ulcers noted.   Healing ulcer at the lateral malleolus left foot.  No drainage swelling or inflammation.  Onychomycosis  Pain in right toes  Pain in left toes  Healing Ulcer left ankle  Consent was obtained for treatment procedures.   Mechanical debridement of nails 1-5  bilaterally performed with a nail nipper.  Filed with dremel without incident. Heel cushion was dispensed for healing skin lesion left ankle.   Return office visit    3 months                 Told  patient to return for periodic foot care and evaluation due to potential at risk complications.   Gardiner Barefoot DPM

## 2021-04-06 ENCOUNTER — Other Ambulatory Visit: Payer: Self-pay | Admitting: Podiatry

## 2021-04-06 ENCOUNTER — Other Ambulatory Visit: Payer: Self-pay | Admitting: Family Medicine

## 2021-04-06 DIAGNOSIS — I69354 Hemiplegia and hemiparesis following cerebral infarction affecting left non-dominant side: Secondary | ICD-10-CM

## 2021-04-06 NOTE — Telephone Encounter (Signed)
Requested medication (s) are due for refill today: no  Requested medication (s) are on the active medication list: yes  Last refill:  10/28/2020  Future visit scheduled: yes  Notes to clinic:  needs labs    Requested Prescriptions  Pending Prescriptions Disp Refills   atorvastatin (LIPITOR) 40 MG tablet [Pharmacy Med Name: Atorvastatin Calcium 40 MG Oral Tablet] 90 tablet 0    Sig: Take 1 tablet by mouth once daily      Cardiovascular:  Antilipid - Statins Failed - 04/06/2021  9:40 AM      Failed - Total Cholesterol in normal range and within 360 days    Cholesterol, Total  Date Value Ref Range Status  06/13/2018 112 100 - 199 mg/dL Final          Failed - LDL in normal range and within 360 days    LDL Calculated  Date Value Ref Range Status  06/13/2018 62 0 - 99 mg/dL Final          Failed - HDL in normal range and within 360 days    HDL  Date Value Ref Range Status  06/13/2018 40 >39 mg/dL Final          Failed - Triglycerides in normal range and within 360 days    Triglycerides  Date Value Ref Range Status  06/13/2018 51 0 - 149 mg/dL Final          Passed - Patient is not pregnant      Passed - Valid encounter within last 12 months    Recent Outpatient Visits           1 month ago Medicare annual wellness visit, subsequent   [place], Carroll Sage, FNP   2 months ago [place], [place], [place]   8 months ago Screening for colon cancer   [place], Charlane Ferretti, MD   10 months ago MND (motor neurone disease) [name])   [facility] [place], Charlane Ferretti, MD   1 year ago Screening for colon cancer   [place], Charlane Ferretti, MD       Future Appointments             In 3 weeks [name], MD [place]

## 2021-04-11 NOTE — Progress Notes (Signed)
Remote pacemaker transmission.   

## 2021-04-13 ENCOUNTER — Encounter (HOSPITAL_COMMUNITY): Payer: Self-pay

## 2021-04-13 ENCOUNTER — Emergency Department (HOSPITAL_COMMUNITY): Payer: Medicare HMO

## 2021-04-13 ENCOUNTER — Other Ambulatory Visit: Payer: Self-pay

## 2021-04-13 ENCOUNTER — Emergency Department (HOSPITAL_COMMUNITY)
Admission: EM | Admit: 2021-04-13 | Discharge: 2021-04-14 | Disposition: A | Discharge status: Home / Self Care | Payer: Medicare HMO | Attending: Emergency Medicine | Admitting: Emergency Medicine

## 2021-04-13 DIAGNOSIS — R000 Tachycardia, unspecified: Secondary | ICD-10-CM | POA: Diagnosis not present

## 2021-04-13 DIAGNOSIS — G1221 Amyotrophic lateral sclerosis: Secondary | ICD-10-CM | POA: Insufficient documentation

## 2021-04-13 DIAGNOSIS — R0789 Other chest pain: Secondary | ICD-10-CM | POA: Diagnosis not present

## 2021-04-13 DIAGNOSIS — Z743 Need for continuous supervision: Secondary | ICD-10-CM | POA: Diagnosis not present

## 2021-04-13 DIAGNOSIS — R0902 Hypoxemia: Secondary | ICD-10-CM | POA: Diagnosis not present

## 2021-04-13 DIAGNOSIS — Z950 Presence of cardiac pacemaker: Secondary | ICD-10-CM | POA: Insufficient documentation

## 2021-04-13 DIAGNOSIS — I129 Hypertensive chronic kidney disease with stage 1 through stage 4 chronic kidney disease, or unspecified chronic kidney disease: Secondary | ICD-10-CM | POA: Diagnosis not present

## 2021-04-13 DIAGNOSIS — N183 Chronic kidney disease, stage 3 unspecified: Secondary | ICD-10-CM | POA: Insufficient documentation

## 2021-04-13 DIAGNOSIS — R6889 Other general symptoms and signs: Secondary | ICD-10-CM

## 2021-04-13 DIAGNOSIS — R079 Chest pain, unspecified: Secondary | ICD-10-CM | POA: Diagnosis not present

## 2021-04-13 DIAGNOSIS — F17210 Nicotine dependence, cigarettes, uncomplicated: Secondary | ICD-10-CM | POA: Diagnosis not present

## 2021-04-13 DIAGNOSIS — R69 Illness, unspecified: Secondary | ICD-10-CM | POA: Diagnosis not present

## 2021-04-13 MED ORDER — GLYCOPYRROLATE 0.2 MG/ML IJ SOLN
0.1000 mg | Freq: Once | INTRAMUSCULAR | Status: AC
  Administered 2021-04-13: 0.1 mg via INTRAVENOUS
  Filled 2021-04-13: qty 1

## 2021-04-13 NOTE — ED Triage Notes (Signed)
A couple of hours ago pt started to have chest pain in middle of chest. He said he felt like he was choking. Pt said that's gone, but is still having a very large amount of clear secretions.

## 2021-04-13 NOTE — ED Provider Notes (Signed)
[facility] Emergency Department Provider Note MRN:  [id number]  Arrival date & time: 04/14/21     Chief Complaint   Chest Pain   History of Present Illness   Darrell [name] is a 70 y.o. year-old male with a history of ALS, CKD presenting to the ED with chief complaint of chest pain.  Patient having excessive secretions this evening, lots of coughing, then began endorsing chest pain.  No other recent illness or fevers, no leg pain or swelling.  I was unable to obtain an accurate HPI, PMH, or ROS due to the patient's respiratory distress, copious secretions.  Level 5 caveat.  Review of Systems  Positive for chest pain, respiratory distress.  Patient's Health History    Past Medical History:  Diagnosis Date   Arthritis    At high risk for falls 08/16/2015   Bradycardia    Chronic kidney disease    stage 3 GFR 30-59 ml/min    CIDP (chronic inflammatory demyelinating polyneuropathy) ([name])    CKD (chronic kidney disease) stage 3, GFR 30-59 ml/min ([name]) 08/16/2015   Dysphagia as late effect of cerebrovascular disease    pts wife states pt has to eat soft foods    Elevated liver enzymes 08/10/2016   GERD (gastroesophageal reflux disease)    Glaucoma    High cholesterol    History of CVA with residual deficit 03/25/2013   Hypertension    Hypertensive retinopathy of both eyes 01/16/2017   Inguinal hernia 03/25/2013   Liver hemangioma 08/14/2016   Neuromuscular disorder ([place])    chronic inflammatory demyelinating polyneuropathy    New onset seizure ([place]) 07/08/2017   seizure 07/14/18   Nuclear sclerosis of both eyes 10/25/2016   Presence of permanent cardiac pacemaker    placed in april 2018   Primary open angle glaucoma of both eyes, indeterminate stage 10/25/2016   Renal mass, right 08/14/2016   Status cardiac pacemaker 01/29/2017   Placed for second degree heart block on 01/16/17 Medtronic Azure XT DR MRI SureScan dual-chamber pacemaker   Stroke [facility])     2011 with residual deficit left sided weakness   Tobacco dependence     Past Surgical History:  Procedure Laterality Date   EYE SURGERY     HERNIA REPAIR     INGUINAL HERNIA REPAIR Right 11/23/2014   Procedure: right inguinal hernia repair with mesh;  Surgeon: Armandina Gemma, MD;  Location: [facility];  Service: General;  Laterality: Right;   INSERTION OF MESH N/A 11/23/2014   Procedure: INSERTION OF MESH;  Surgeon: Armandina Gemma, MD;  Location: [facility];  Service: General;  Laterality: N/A;   MASS EXCISION Left 08/29/2017   Procedure: EXCISION OF LEFT NECK MASS;  Surgeon: Coralie Keens, MD;  Location: [place];  Service: General;  Laterality: Left;   PACEMAKER IMPLANT N/A 01/16/2017   Procedure: Pacemaker Implant;  Surgeon: Will Meredith Leeds, MD;  Location: [place] CV LAB;  Service: Cardiovascular;  Laterality: N/A;   SHOULDER SURGERY Bilateral 1988, 1998    Family History  Problem Relation Age of Onset   Hypertension Mother    Diabetes Sister    Hypertension Sister    Cancer Sister        1 sister   COPD Sister        in 1 sister   Stomach cancer Neg Hx    Colon cancer Neg Hx    Pancreatic cancer Neg Hx    Esophageal cancer Neg Hx  Social History   Socioeconomic History   Marital status: Married    Spouse name: [name]   Number of children: 1   Years of education: college   Highest education level: Not on file  Occupational History   Occupation: retired  Tobacco Use   Smoking status: Every Day    Packs/day: 1.00    Years: 40.00    Pack years: 40.00    Types: Cigarettes   Smokeless tobacco: Never   Tobacco comments:    12/13/20 5 a day  Vaping Use   Vaping Use: Never used  Substance and Sexual Activity   Alcohol use: No    Comment: alcohol free for 1 year was drinking 1/2 pint per day    Drug use: No   Sexual activity: Not on file  Other Topics Concern   Not on file  Social History Narrative   09/13/17 Patient lives at home with spouse.   Has 59 yo daughter    No grandchildren   Social Determinants of Radio broadcast assistant Strain: Not on file  Food Insecurity: Not on file  Transportation Needs: Not on file  Physical Activity: Not on file  Stress: Not on file  Social Connections: Not on file  Intimate Partner Violence: Not on file     Physical Exam   Vitals:   04/14/21 0200 04/14/21 0300  BP: (!) 169/103 (!) 171/92  Pulse: 83 64  Resp: 19 15  Temp:    SpO2: 98% 98%    CONSTITUTIONAL: Chronically ill-appearing, NAD NEURO:  Alert and oriented x 3, no focal deficits EYES:  eyes equal and reactive ENT/NECK:  no LAD, no JVD CARDIO: Regular rate, well-perfused, normal S1 and S2 PULM:  CTAB no wheezing or rhonchi GI/GU:  normal bowel sounds, non-distended, non-tender MSK/SPINE:  No gross deformities, no edema SKIN:  no rash, atraumatic PSYCH:  Appropriate speech and behavior  *Additional and/or pertinent findings included in MDM below  Diagnostic and Interventional Summary    EKG Interpretation  Date/Time:  Wednesday April 13 2021 23:29:34 EDT Ventricular Rate:  70 PR Interval:  223 QRS Duration: 142 QT Interval:  448 QTC Calculation: 484 R Axis:   249 Text Interpretation: Paced Rhythm Confirmed by Gerlene Fee [phone]) on 04/13/2021 11:42:57 PM       Labs Reviewed  CBC - Abnormal; Notable for the following components:      Result Value   RBC 4.01 (*)    Hemoglobin 12.3 (*)    All other components within normal limits  COMPREHENSIVE METABOLIC PANEL - Abnormal; Notable for the following components:   Chloride 114 (*)    BUN 27 (*)    Creatinine, Ser 1.97 (*)    Total Protein 5.5 (*)    Albumin 3.1 (*)    AST 13 (*)    GFR, Estimated 36 (*)    Anion gap 4 (*)    All other components within normal limits  BRAIN NATRIURETIC PEPTIDE - Abnormal; Notable for the following components:   B Natriuretic Peptide 101.7 (*)    All other components within normal limits  PROTIME-INR  TROPONIN I (HIGH SENSITIVITY)   TROPONIN I (HIGH SENSITIVITY)    DG Chest Port 1 View  Final Result      Medications  glycopyrrolate (ROBINUL) injection 0.1 mg (0.1 mg Intravenous Given 04/13/21 2353)     Procedures  /  Critical Care Procedures  ED Course and Medical Decision Making  I have reviewed the triage vital signs,  the nursing notes, and pertinent available records from the EMR.  Listed above are laboratory and imaging tests that I personally ordered, reviewed, and interpreted and then considered in my medical decision making (see below for details).  Suspect more of an aspiration event causing coughing and chest pain.  Also considering ACS, PE but felt to be less likely.  Awaiting labs, chest x-ray.     EKG with more widened appearance compared to prior from last year, unclear significance.  Some discordant ST segment findings.  Patient initially with copious secretions requiring frequent suction but after glycopyrrolate and deep suction with respiratory therapy he has had no issues.  Troponin is negative x2, he has now been sitting comfortably with no issues controlling his secretions for 2+ hours, tolerating p.o.  X-ray is clear.  Patient wishes to go home.  Advised close PCP follow-up, appropriate for discharge.  Barth Kirks. [place] Small, [place] [email or web address]  Final Clinical Impressions(s) / ED Diagnoses     ICD-10-CM   1. Chest pain, unspecified type  R07.9     2. Copious oral secretions  R68.89     3. ALS (amyotrophic lateral sclerosis) ([place])  G12.21       ED Discharge Orders     None        Discharge Instructions Discussed with and Provided to Patient:     Discharge Instructions      You were evaluated in the Emergency Department and after careful evaluation, we did not find any emergent condition requiring admission or further testing in the hospital.  Your exam/testing today was overall reassuring.  Recommend close follow-up  with your regular doctors to discuss your ED visit.  Please return to the Emergency Department if you experience any worsening of your condition.  Thank you for allowing [place] to be a part of your care.         Maudie Flakes, MD 04/14/21 [phone]

## 2021-04-14 LAB — PROTIME-INR
INR: 1 (ref 0.8–1.2)
Prothrombin Time: 13 seconds (ref 11.4–15.2)

## 2021-04-14 LAB — CBC
HCT: 39.1 % (ref 39.0–52.0)
Hemoglobin: 12.3 g/dL — ABNORMAL LOW (ref 13.0–17.0)
MCH: 30.7 pg (ref 26.0–34.0)
MCHC: 31.5 g/dL (ref 30.0–36.0)
MCV: 97.5 fL (ref 80.0–100.0)
Platelets: 259 10*3/uL (ref 150–400)
RBC: 4.01 MIL/uL — ABNORMAL LOW (ref 4.22–5.81)
RDW: 13.4 % (ref 11.5–15.5)
WBC: 6.6 10*3/uL (ref 4.0–10.5)
nRBC: 0 % (ref 0.0–0.2)

## 2021-04-14 LAB — COMPREHENSIVE METABOLIC PANEL
ALT: 10 U/L (ref 0–44)
AST: 13 U/L — ABNORMAL LOW (ref 15–41)
Albumin: 3.1 g/dL — ABNORMAL LOW (ref 3.5–5.0)
Alkaline Phosphatase: 87 U/L (ref 38–126)
Anion gap: 4 — ABNORMAL LOW (ref 5–15)
BUN: 27 mg/dL — ABNORMAL HIGH (ref 8–23)
CO2: 24 mmol/L (ref 22–32)
Calcium: 8.9 mg/dL (ref 8.9–10.3)
Chloride: 114 mmol/L — ABNORMAL HIGH (ref 98–111)
Creatinine, Ser: 1.97 mg/dL — ABNORMAL HIGH (ref 0.61–1.24)
GFR, Estimated: 36 mL/min — ABNORMAL LOW (ref >60)
Glucose, Bld: 98 mg/dL (ref 70–99)
Potassium: 5 mmol/L (ref 3.5–5.1)
Sodium: 142 mmol/L (ref 135–145)
Total Bilirubin: 1 mg/dL (ref 0.3–1.2)
Total Protein: 5.5 g/dL — ABNORMAL LOW (ref 6.5–8.1)

## 2021-04-14 LAB — TROPONIN I (HIGH SENSITIVITY)
Troponin I (High Sensitivity): 10 ng/L (ref <18)
Troponin I (High Sensitivity): 9 ng/L (ref <18)

## 2021-04-14 LAB — BRAIN NATRIURETIC PEPTIDE: B Natriuretic Peptide: 101.7 pg/mL — ABNORMAL HIGH (ref 0.0–100.0)

## 2021-04-14 NOTE — ED Notes (Signed)
PT refusing PTAR at this time. PT to go home with family.

## 2021-04-14 NOTE — ED Notes (Signed)
Pt has much less phlegm. Pt coughing occasionally, but not so much where he's throwing it up. Denies further needs.

## 2021-04-14 NOTE — ED Notes (Signed)
Ptar called 

## 2021-04-14 NOTE — ED Notes (Signed)
Patient verbalizes understanding of discharge instructions. Opportunity for questioning and answers were provided. Armband removed by staff, pt discharged from ED via wheelchair to lobby to return home with family.  

## 2021-04-14 NOTE — Discharge Instructions (Addendum)
You were evaluated in the Emergency Department and after careful evaluation, we did not find any emergent condition requiring admission or further testing in the hospital.  Your exam/testing today was overall reassuring.  Recommend close follow-up with your regular doctors to discuss your ED visit.  Please return to the Emergency Department if you experience any worsening of your condition.  Thank you for allowing [place] to be a part of your care.

## 2021-04-14 NOTE — ED Notes (Signed)
Dr. Sedonia Small ok'd pt for ice chips. Pt given and wife feeding them to him. Pt tolerating well.

## 2021-04-14 NOTE — ED Notes (Addendum)
Pt resting in bed with wife at side. NAD noted.

## 2021-04-18 ENCOUNTER — Other Ambulatory Visit: Payer: Self-pay | Admitting: Family Medicine

## 2021-04-18 DIAGNOSIS — N184 Chronic kidney disease, stage 4 (severe): Secondary | ICD-10-CM

## 2021-04-18 NOTE — Telephone Encounter (Signed)
  Notes to clinic:  Patient has appt on 04/27/2021 Review for refill Last filled on 02/16/2021   Requested Prescriptions  Pending Prescriptions Disp Refills   FEROSUL 325 (65 Fe) MG tablet [Pharmacy Med Name: FeroSul 325 (65 Fe) MG Oral Tablet] 60 tablet 0    Sig: TAKE 1 TABLET BY MOUTH TWICE DAILY WITH A MEAL      Endocrinology:  Minerals - Iron Supplementation Failed - 04/18/2021 11:02 AM      Failed - HGB in normal range and within 360 days    Hemoglobin  Date Value Ref Range Status  04/13/2021 12.3 (L) 13.0 - 17.0 g/dL Final  03/31/2020 10.2 (L) 13.0 - 17.7 g/dL Final          Failed - RBC in normal range and within 360 days    RBC  Date Value Ref Range Status  04/13/2021 4.01 (L) 4.22 - 5.81 MIL/uL Final          Failed - Fe (serum) in normal range and within 360 days    Iron  Date Value Ref Range Status  05/05/2019 72 45 - 182 ug/dL Final   Saturation Ratios  Date Value Ref Range Status  05/05/2019 29 17.9 - 39.5 % Final          Failed - Ferritin in normal range and within 360 days    Ferritin  Date Value Ref Range Status  05/05/2019 190 24 - 336 ng/mL Final    Comment:    Performed at [facility], [place] [address] [place], [place] [address]          Passed - HCT in normal range and within 360 days    HCT  Date Value Ref Range Status  04/13/2021 39.1 39.0 - 52.0 % Final   Hematocrit  Date Value Ref Range Status  03/31/2020 31.4 (L) 37.5 - 51.0 % Final          Passed - Valid encounter within last 12 months    Recent Outpatient Visits           1 month ago Medicare annual wellness visit, subsequent   [place], Carroll Sage, FNP   3 months ago [place], [place], [place]   8 months ago Screening for colon cancer   [place], Charlane Ferretti, MD   10 months ago MND (motor neurone disease) [facility])   [place]  [facility] [name], MD   1 year ago Screening for colon cancer   [place], MD       Future Appointments             In 1 week [name], MD [place]

## 2021-04-24 DIAGNOSIS — G1221 Amyotrophic lateral sclerosis: Secondary | ICD-10-CM | POA: Diagnosis not present

## 2021-04-27 ENCOUNTER — Other Ambulatory Visit: Payer: Self-pay

## 2021-04-27 ENCOUNTER — Encounter: Payer: Self-pay | Admitting: Family Medicine

## 2021-04-27 ENCOUNTER — Ambulatory Visit: Payer: Medicare HMO | Attending: Family Medicine | Admitting: Family Medicine

## 2021-04-27 ENCOUNTER — Telehealth: Payer: Self-pay

## 2021-04-27 VITALS — BP 151/83 | HR 59 | Ht 75.0 in

## 2021-04-27 DIAGNOSIS — K921 Melena: Secondary | ICD-10-CM | POA: Diagnosis not present

## 2021-04-27 DIAGNOSIS — I69354 Hemiplegia and hemiparesis following cerebral infarction affecting left non-dominant side: Secondary | ICD-10-CM

## 2021-04-27 DIAGNOSIS — I10 Essential (primary) hypertension: Secondary | ICD-10-CM | POA: Diagnosis not present

## 2021-04-27 DIAGNOSIS — G1220 Motor neuron disease, unspecified: Secondary | ICD-10-CM

## 2021-04-27 DIAGNOSIS — R059 Cough, unspecified: Secondary | ICD-10-CM | POA: Diagnosis not present

## 2021-04-27 MED ORDER — AMLODIPINE BESYLATE 5 MG PO TABS: 5.0000 mg | ORAL_TABLET | Freq: Every day | ORAL | 1 refills | Status: DC

## 2021-04-27 MED ORDER — ATORVASTATIN CALCIUM 40 MG PO TABS: 40.0000 mg | ORAL_TABLET | Freq: Every day | ORAL | 1 refills | Status: DC

## 2021-04-27 MED ORDER — BREZTRI AEROSPHERE 160-9-4.8 MCG/ACT IN AERO: 2.0000 | INHALATION_SPRAY | Freq: Two times a day (BID) | RESPIRATORY_TRACT | 1 refills | Status: DC

## 2021-04-27 NOTE — Telephone Encounter (Signed)
Met with the patient and his wife when they were in the clinic today.  He was interested in home health PT. This CM explained that a referral can be placed but there is no guarantee that an agency will accept the referral. They need to be in network with his insurance and have staff available. The patient has used [facility] and [place] in the past but has no preference for agencies  This CM explained CAP services again and the benefit for extra help for his wife in caring for him.There is about a 3 month wait for services at this time.  He was resistant at first but was eventually agreeable to having this CM place a referral for him.   He would like a new wheelchair.  A new referral can be placed and his insurance coverage can be verified to determine if he is eligible for a new chair.  He has a power chair at home but his wife said that he never uses ut.   It was provided through the [facility].

## 2021-04-27 NOTE — Progress Notes (Signed)
Subjective:  Patient ID: Darrell [name], male    DOB: 16-Jul-1951  Age: 70 y.o. MRN: [id number]  CC: Cough   HPI Darrell [name]  is a 70 year old male with a history of hypertension, previous CVA with residual left-sided weakness, seizure in the setting of prior CVA, second degree AV block (status post medtronic dual chamber pacemaker) stage III CKD, motor neuron disease/ALS who is seen today for chronic disease management.  Interval History: His ankles hurt with the left hurting more than the right.  Of note his left ankle is deviated laterally to the left.  He has been coughing and is unable to get his phlegm out.  Placed on azithromycin after an ED visit.  He currently follows up with pulmonary and is on albuterol MDI.  Denies presence of fever or worsening dyspnea.  Appointment with ALS specialist at [facility] comes up next week.  He will require a new manual wheelchair as he is unable to ambulate and cannot use a walker.  We will also benefit from [facility] for PT. Closely followed by his nephrologist. He continues to have melena and of note is on iron tablets due to anemia.  Has been unable to undergo colonoscopy due to medical conditions. Past Medical History:  Diagnosis Date   Arthritis    At high risk for falls 08/16/2015   Bradycardia    Chronic kidney disease    stage 3 GFR 30-59 ml/min    CIDP (chronic inflammatory demyelinating polyneuropathy) ([name])    CKD (chronic kidney disease) stage 3, GFR 30-59 ml/min ([name]) 08/16/2015   Dysphagia as late effect of cerebrovascular disease    pts wife states pt has to eat soft foods    Elevated liver enzymes 08/10/2016   GERD (gastroesophageal reflux disease)    Glaucoma    High cholesterol    History of CVA with residual deficit 03/25/2013   Hypertension    Hypertensive retinopathy of both eyes 01/16/2017   Inguinal hernia 03/25/2013   Liver hemangioma 08/14/2016   Neuromuscular disorder ([place])    chronic inflammatory  demyelinating polyneuropathy    New onset seizure ([place]) 07/08/2017   seizure 07/14/18   Nuclear sclerosis of both eyes 10/25/2016   Presence of permanent cardiac pacemaker    placed in april 2018   Primary open angle glaucoma of both eyes, indeterminate stage 10/25/2016   Renal mass, right 08/14/2016   Status cardiac pacemaker 01/29/2017   Placed for second degree heart block on 01/16/17 Medtronic Azure XT DR MRI SureScan dual-chamber pacemaker   Stroke [facility])    2011 with residual deficit left sided weakness   Tobacco dependence     Past Surgical History:  Procedure Laterality Date   EYE SURGERY     HERNIA REPAIR     INGUINAL HERNIA REPAIR Right 11/23/2014   Procedure: right inguinal hernia repair with mesh;  Surgeon: Armandina Gemma, MD;  Location: [facility];  Service: General;  Laterality: Right;   INSERTION OF MESH N/A 11/23/2014   Procedure: INSERTION OF MESH;  Surgeon: Armandina Gemma, MD;  Location: [facility];  Service: General;  Laterality: N/A;   MASS EXCISION Left 08/29/2017   Procedure: EXCISION OF LEFT NECK MASS;  Surgeon: Coralie Keens, MD;  Location: [place];  Service: General;  Laterality: Left;   PACEMAKER IMPLANT N/A 01/16/2017   Procedure: Pacemaker Implant;  Surgeon: Will Meredith Leeds, MD;  Location: [place] [facility];  Service: Cardiovascular;  Laterality: N/A;   SHOULDER SURGERY  Bilateral 1988, 1998    Family History  Problem Relation Age of Onset   Hypertension Mother    Diabetes Sister    Hypertension Sister    Cancer Sister        1 sister   COPD Sister        in 1 sister   Stomach cancer Neg Hx    Colon cancer Neg Hx    Pancreatic cancer Neg Hx    Esophageal cancer Neg Hx     Allergies  Allergen Reactions   Ace Inhibitors Other (See Comments)    Hyperkalemia   Doxycycline Other (See Comments)    Hiccups, cough, nausea and emesis, elevated liver enzymes, elevated eosinophils, SOB concerning for early DRESS syndrome    Atacand Hct [Candesartan Cilexetil-Hctz]  Hives   Shellfish Allergy Hives    Outpatient Medications Prior to Visit  Medication Sig Dispense Refill   albuterol (VENTOLIN HFA) 108 (90 Base) MCG/ACT inhaler Inhale 1-2 puffs into the lungs every 6 (six) hours as needed for wheezing or shortness of breath. 18 g 2   albuterol (VENTOLIN HFA) 108 (90 Base) MCG/ACT inhaler Inhale 2 puffs into the lungs every 6 (six) hours as needed for up to 30 days for Wheezing. 18 g 5   aspirin EC 81 MG tablet Take 1 tablet (81 mg total) by mouth daily. 90 tablet 3   atorvastatin (LIPITOR) 40 MG tablet Take 1 tablet by mouth once daily 30 tablet 0   azithromycin (ZITHROMAX) 500 MG tablet Take by mouth daily. 12/13/20 take 1 tab every Mon, Wed, Fri     azithromycin (ZITHROMAX) 500 MG tablet Take 1 tablet (500 mg total) by mouth 3 (three) times a week. 36 tablet 3   brimonidine (ALPHAGAN P) 0.1 % SOLN Place 1 drop into both eyes daily.      cetirizine (ZYRTEC) 10 MG tablet Take 1 tablet (10 mg total) by mouth daily. 90 tablet 1   dicyclomine (BENTYL) 10 MG capsule Take 1 capsule (10 mg total) by mouth in the morning and at bedtime. 60 capsule 2   dorzolamide-timolol (COSOPT) 22.3-6.8 MG/ML ophthalmic solution Place 1 drop into both eyes daily.      FEROSUL 325 (65 Fe) MG tablet TAKE 1 TABLET BY MOUTH TWICE DAILY WITH A MEAL 60 tablet 0   fluticasone (FLONASE) 50 MCG/ACT nasal spray Place 2 sprays into both nostrils daily. 16 g 3   guaiFENesin-dextromethorphan (ROBITUSSIN DM) 100-10 MG/5ML syrup Take 5 mLs by mouth every 6 (six) hours as needed for cough. 236 mL 0   hydrOXYzine (ATARAX/VISTARIL) 25 MG tablet Take 1 tablet (25 mg total) by mouth at bedtime. 30 tablet 1   latanoprost (XALATAN) 0.005 % ophthalmic solution Place 1 drop into both eyes at bedtime.     levETIRAcetam (KEPPRA) 500 MG tablet Take 1 tablet (500 mg total) by mouth 2 (two) times daily. 180 tablet 4   meloxicam (MOBIC) 15 MG tablet Take 1 tablet by mouth once daily 30 tablet 0   metoprolol  tartrate (LOPRESSOR) 50 MG tablet Take 1 tablet by mouth twice daily 180 tablet 3   Misc. Devices MISC Yankauer suction. Dx: ALS 1 each 0   Misc. Devices MISC Coughalator 1 each 0   Misc. Devices MISC Nebulizer machine.  Diagnosis- Motor neuron disease 1 each 0   Misc. Devices [place] [facility] Bed DXg12.20 1 each 0   Misc. [place] [facility] bed extension. Diagnosis: ALS. Height 6'4" 1 each 0  Multiple Vitamin (MULTI-VITAMIN) tablet Take by mouth.     Multiple Vitamins-Iron (DAILY VITAMIN FORMULA+IRON) TABS Take 1 tablet by mouth daily.     Multiple Vitamins-Iron (MULTIVITAMINS WITH IRON) TABS tablet Take 1 tablet by mouth.     tamsulosin (FLOMAX) 0.4 MG CAPS capsule Take 1 capsule by mouth once daily 90 capsule 0   No facility-administered medications prior to visit.     ROS Review of Systems  Constitutional:  Negative for activity change and appetite change.  HENT:  Negative for sinus pressure and sore throat.   Eyes:  Negative for visual disturbance.  Respiratory:  Positive for cough. Negative for chest tightness and shortness of breath.   Cardiovascular:  Negative for chest pain and leg swelling.  Gastrointestinal:  Negative for abdominal distention, abdominal pain, constipation and diarrhea.  Endocrine: Negative.   Genitourinary:  Negative for dysuria.  Musculoskeletal:  Positive for arthralgias and gait problem. Negative for joint swelling and myalgias.  Skin:  Negative for rash.  Allergic/Immunologic: Negative.   Neurological:  Positive for weakness. Negative for light-headedness and numbness.  Psychiatric/Behavioral:  Negative for dysphoric mood and suicidal ideas.    Objective:  BP (!) 151/83   Pulse (!) 59   Ht 6\' 3"  (1.905 m)   SpO2 98%   BMI 22.50 kg/m   BP/Weight 04/27/2021 04/14/2021 2/59/5638  Systolic BP 756 433 -  Diastolic BP 83 295 -  Wt. (Lbs) - - 180  BMI 22.5 - 22.5      Physical Exam Constitutional:      Appearance: He is  well-developed.     Comments: Sitting comfortably in a wheelchair  Cardiovascular:     Rate and Rhythm: Normal rate.     Heart sounds: Normal heart sounds. No murmur heard. Pulmonary:     Effort: Pulmonary effort is normal.     Breath sounds: Normal breath sounds. No wheezing or rales.  Chest:     Chest wall: No tenderness.  Abdominal:     General: Bowel sounds are normal. There is no distension.     Palpations: Abdomen is soft. There is no mass.     Tenderness: There is no abdominal tenderness.  Musculoskeletal:     Right lower leg: No edema.     Left lower leg: No edema.     Comments: Restricted range of motion of bilateral lower extremities Left ankle laterally deviated slightly  Neurological:     Mental Status: He is alert and oriented to person, place, and time.  Psychiatric:        Mood and Affect: Mood normal.    CMP Latest Ref Rng & Units 04/13/2021 03/31/2020 05/08/2019  Glucose 70 - 99 mg/dL 98 95 91  BUN 8 - 23 mg/dL 27(H) 24 64(H)  Creatinine 0.61 - 1.24 mg/dL 1.97(H) 2.09(H) 2.78(H)  Sodium 135 - 145 mmol/L 142 142 138  Potassium 3.5 - 5.1 mmol/L 5.0 5.1 4.5  Chloride 98 - 111 mmol/L 114(H) 107(H) 112(H)  CO2 22 - 32 mmol/L 24 27 21(L)  Calcium 8.9 - 10.3 mg/dL 8.9 10.4(H) 9.2  Total Protein 6.5 - 8.1 g/dL 5.5(L) 6.0 -  Total Bilirubin 0.3 - 1.2 mg/dL 1.0 0.2 -  Alkaline Phos 38 - 126 U/L 87 108 -  AST 15 - 41 U/L 13(L) 14 -  ALT 0 - 44 U/L 10 11 -    Lipid Panel     Component Value Date/Time   CHOL 112 06/13/2018 0954   TRIG 51 06/13/2018  0954   HDL 40 06/13/2018 0954   CHOLHDL 2.8 06/13/2018 0954   CHOLHDL 4.1 08/16/2015 1555   VLDL 23 08/16/2015 1555   LDLCALC 62 06/13/2018 0954    CBC    Component Value Date/Time   WBC 6.6 04/13/2021 2325   RBC 4.01 (L) 04/13/2021 2325   HGB 12.3 (L) 04/13/2021 2325   HGB 10.2 (L) 03/31/2020 1417   HCT 39.1 04/13/2021 2325   HCT 31.4 (L) 03/31/2020 1417   PLT 259 04/13/2021 2325   PLT 227 03/31/2020 1417    MCV 97.5 04/13/2021 2325   MCV 94 03/31/2020 1417   MCH 30.7 04/13/2021 2325   MCHC 31.5 04/13/2021 2325   RDW 13.4 04/13/2021 2325   RDW 12.6 03/31/2020 1417   LYMPHSABS 2.3 03/31/2020 1417   MONOABS 0.7 05/07/2019 0529   EOSABS 0.5 (H) 03/31/2020 1417   BASOSABS 0.1 03/31/2020 1417    Lab Results  Component Value Date   HGBA1C 5.1 08/20/2019    Assessment & Plan:  1. Hemiparesis affecting left side as late effect of stroke ([name]) Risk factor modification - atorvastatin (LIPITOR) 40 MG tablet; Take 1 tablet (40 mg total) by mouth daily.  Dispense: 90 tablet; Refill: 1  2. Melena He is currently on iron tablets for chronic management of anemia which could cause this Given prolonged duration of Aislinn Feliz I am referring to GI - Ambulatory referral to Gastroenterology  3. Cough in adult patient Uncontrolled Given history of smoking and COPD is likely We will add on [facility] Inform pulmonologist of symptoms - Budeson-Glycopyrrol-Formoterol (BREZTRI AEROSPHERE) 160-9-4.8 MCG/ACT AERO; Inhale 2 puffs into the lungs 2 (two) times daily.  Dispense: 10.7 g; Refill: 1  4. 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[place]) This is gradually progressing and could explain his ankle pain Will refer for PT Spoke with case manager who will assist in referral to the Program Follow-up with [facility]. Devices MISC; Manual wheelchair.  Diagnosis-ALS  Dispense: 1 each; Refill: 0 - Ambulatory referral to Home Health  5. Essential hypertension Elevated blood pressure Low-dose amlodipine added to regimen Counseled on blood pressure goal of less than 130/80, low-sodium, DASH diet, medication compliance, 150 minutes of moderate intensity exercise per week. Discussed medication compliance, adverse effects. - amLODipine (NORVASC) 5 MG tablet; Take 1 tablet (5 mg total) by mouth daily.  Dispense: 90 tablet; Refill: 1    No orders of the defined types were placed in this encounter.   Return in about 3  months (around 07/28/2021) for medical conditions.       [name], MD, FAAFP. [facility] and [place], [place]   04/27/2021, 2:19 PM

## 2021-04-27 NOTE — Progress Notes (Signed)
Has a cough that will not go away. Having pain in right ankle.

## 2021-04-27 NOTE — Patient Instructions (Signed)

## 2021-04-28 MED ORDER — MISC. DEVICES MISC: 0 refills | Status: DC

## 2021-05-03 NOTE — Telephone Encounter (Signed)
Home health PT referral sent to [facility]

## 2021-05-04 ENCOUNTER — Other Ambulatory Visit: Payer: Self-pay

## 2021-05-04 DIAGNOSIS — M25572 Pain in left ankle and joints of left foot: Secondary | ICD-10-CM | POA: Diagnosis not present

## 2021-05-04 DIAGNOSIS — R471 Dysarthria and anarthria: Secondary | ICD-10-CM | POA: Diagnosis not present

## 2021-05-04 DIAGNOSIS — R29898 Other symptoms and signs involving the musculoskeletal system: Secondary | ICD-10-CM | POA: Diagnosis not present

## 2021-05-04 DIAGNOSIS — R1310 Dysphagia, unspecified: Secondary | ICD-10-CM | POA: Diagnosis not present

## 2021-05-04 DIAGNOSIS — G1223 Primary lateral sclerosis: Secondary | ICD-10-CM | POA: Diagnosis not present

## 2021-05-04 DIAGNOSIS — Z79899 Other long term (current) drug therapy: Secondary | ICD-10-CM | POA: Diagnosis not present

## 2021-05-04 DIAGNOSIS — G1220 Motor neuron disease, unspecified: Secondary | ICD-10-CM

## 2021-05-04 MED ORDER — LIDOCAINE 5 % EX PTCH
MEDICATED_PATCH | CUTANEOUS | 0 refills | Status: DC
  Filled 2021-05-04: qty 30, 30d supply, fill #0

## 2021-05-04 MED ORDER — MISC. DEVICES MISC: 0 refills | Status: DC

## 2021-05-04 MED ORDER — GLYCOPYRROLATE 1 MG PO TABS
ORAL_TABLET | ORAL | 5 refills | Status: DC
  Filled 2021-05-04: qty 60, 30d supply, fill #0

## 2021-05-04 NOTE — Telephone Encounter (Signed)
Call placed to [place] ([facility]), spoke to [place] and placed referral.  She requested that this CM contact patient and inform him that he should receive a packet of information from [facility] in about 2 weeks.He does not have Medicaid but will be directed  to apply for special assistance Medicaid after receiving the application.  Call placed to patient's wife, [name].  They were at [facility] for an appointment for patient.  Explained to her that [facility] referral has been placed and shared information noted above. When she receives the information from [facility], she will need to complete a couple of documents and then bring the medical portion to [facility] for Dr Margarita Rana to complete.     Regarding the wheelchair, [name]  has not heard from [place], where the order was sent.  She  was in agreement to having the order sent to [place] for processing.  - order then sent to [facility] as requested.   Regarding the home health order.  She said that [facility] contacted them and will be out to see the patient on Monday - 05/09/2021.

## 2021-05-05 ENCOUNTER — Other Ambulatory Visit: Payer: Self-pay

## 2021-05-05 ENCOUNTER — Telehealth: Payer: Self-pay | Admitting: Family Medicine

## 2021-05-05 NOTE — Telephone Encounter (Signed)
Copied from [place] [id number]. Topic: General - Other >> May 04, 2021  8:18 AM Alanda Slim E wrote: Reason for CRM: [facility] called to let [facility] know that they can accept pt and do start of care in 48 hrs /

## 2021-05-05 NOTE — Telephone Encounter (Signed)
Noted.  The patient's wife informed this CM that the start of care is scheduled for 05/09/2021.

## 2021-05-06 ENCOUNTER — Encounter (HOSPITAL_COMMUNITY): Payer: Self-pay

## 2021-05-06 ENCOUNTER — Observation Stay (HOSPITAL_COMMUNITY)
Admission: EM | Admit: 2021-05-06 | Discharge: 2021-05-07 | Disposition: A | Discharge status: Home / Self Care | Payer: Medicare HMO | Attending: Emergency Medicine | Admitting: Emergency Medicine

## 2021-05-06 ENCOUNTER — Emergency Department (HOSPITAL_COMMUNITY): Payer: Medicare HMO

## 2021-05-06 DIAGNOSIS — R4781 Slurred speech: Secondary | ICD-10-CM | POA: Diagnosis not present

## 2021-05-06 DIAGNOSIS — Z743 Need for continuous supervision: Secondary | ICD-10-CM | POA: Diagnosis not present

## 2021-05-06 DIAGNOSIS — I129 Hypertensive chronic kidney disease with stage 1 through stage 4 chronic kidney disease, or unspecified chronic kidney disease: Secondary | ICD-10-CM | POA: Diagnosis not present

## 2021-05-06 DIAGNOSIS — U071 COVID-19: Secondary | ICD-10-CM | POA: Diagnosis not present

## 2021-05-06 DIAGNOSIS — R69 Illness, unspecified: Secondary | ICD-10-CM | POA: Diagnosis not present

## 2021-05-06 DIAGNOSIS — G1223 Primary lateral sclerosis: Secondary | ICD-10-CM | POA: Diagnosis not present

## 2021-05-06 DIAGNOSIS — Z8673 Personal history of transient ischemic attack (TIA), and cerebral infarction without residual deficits: Secondary | ICD-10-CM | POA: Insufficient documentation

## 2021-05-06 DIAGNOSIS — D638 Anemia in other chronic diseases classified elsewhere: Secondary | ICD-10-CM

## 2021-05-06 DIAGNOSIS — E782 Mixed hyperlipidemia: Secondary | ICD-10-CM | POA: Diagnosis not present

## 2021-05-06 DIAGNOSIS — Z79899 Other long term (current) drug therapy: Secondary | ICD-10-CM | POA: Insufficient documentation

## 2021-05-06 DIAGNOSIS — N400 Enlarged prostate without lower urinary tract symptoms: Secondary | ICD-10-CM

## 2021-05-06 DIAGNOSIS — G40909 Epilepsy, unspecified, not intractable, without status epilepticus: Secondary | ICD-10-CM | POA: Diagnosis not present

## 2021-05-06 DIAGNOSIS — D649 Anemia, unspecified: Secondary | ICD-10-CM

## 2021-05-06 DIAGNOSIS — J449 Chronic obstructive pulmonary disease, unspecified: Secondary | ICD-10-CM | POA: Diagnosis not present

## 2021-05-06 DIAGNOSIS — Z7982 Long term (current) use of aspirin: Secondary | ICD-10-CM | POA: Insufficient documentation

## 2021-05-06 DIAGNOSIS — I10 Essential (primary) hypertension: Secondary | ICD-10-CM | POA: Diagnosis not present

## 2021-05-06 DIAGNOSIS — F17210 Nicotine dependence, cigarettes, uncomplicated: Secondary | ICD-10-CM | POA: Insufficient documentation

## 2021-05-06 DIAGNOSIS — N1832 Chronic kidney disease, stage 3b: Secondary | ICD-10-CM | POA: Diagnosis not present

## 2021-05-06 DIAGNOSIS — I4891 Unspecified atrial fibrillation: Secondary | ICD-10-CM | POA: Diagnosis not present

## 2021-05-06 DIAGNOSIS — G9341 Metabolic encephalopathy: Principal | ICD-10-CM | POA: Insufficient documentation

## 2021-05-06 DIAGNOSIS — Y900 Blood alcohol level of less than 20 mg/100 ml: Secondary | ICD-10-CM | POA: Insufficient documentation

## 2021-05-06 DIAGNOSIS — R29810 Facial weakness: Secondary | ICD-10-CM | POA: Diagnosis not present

## 2021-05-06 DIAGNOSIS — I4510 Unspecified right bundle-branch block: Secondary | ICD-10-CM | POA: Diagnosis not present

## 2021-05-06 DIAGNOSIS — R410 Disorientation, unspecified: Secondary | ICD-10-CM | POA: Diagnosis not present

## 2021-05-06 DIAGNOSIS — D631 Anemia in chronic kidney disease: Secondary | ICD-10-CM | POA: Insufficient documentation

## 2021-05-06 DIAGNOSIS — Z950 Presence of cardiac pacemaker: Secondary | ICD-10-CM | POA: Insufficient documentation

## 2021-05-06 DIAGNOSIS — R0602 Shortness of breath: Secondary | ICD-10-CM | POA: Diagnosis not present

## 2021-05-06 DIAGNOSIS — N184 Chronic kidney disease, stage 4 (severe): Secondary | ICD-10-CM

## 2021-05-06 LAB — COMPREHENSIVE METABOLIC PANEL
ALT: 13 U/L (ref 0–44)
AST: 11 U/L — ABNORMAL LOW (ref 15–41)
Albumin: 2.6 g/dL — ABNORMAL LOW (ref 3.5–5.0)
Alkaline Phosphatase: 70 U/L (ref 38–126)
Anion gap: 4 — ABNORMAL LOW (ref 5–15)
BUN: 28 mg/dL — ABNORMAL HIGH (ref 8–23)
CO2: 21 mmol/L — ABNORMAL LOW (ref 22–32)
Calcium: 7.9 mg/dL — ABNORMAL LOW (ref 8.9–10.3)
Chloride: 116 mmol/L — ABNORMAL HIGH (ref 98–111)
Creatinine, Ser: 1.73 mg/dL — ABNORMAL HIGH (ref 0.61–1.24)
GFR, Estimated: 42 mL/min — ABNORMAL LOW (ref >60)
Glucose, Bld: 123 mg/dL — ABNORMAL HIGH (ref 70–99)
Potassium: 4 mmol/L (ref 3.5–5.1)
Sodium: 141 mmol/L (ref 135–145)
Total Bilirubin: 0.2 mg/dL — ABNORMAL LOW (ref 0.3–1.2)
Total Protein: 4.7 g/dL — ABNORMAL LOW (ref 6.5–8.1)

## 2021-05-06 LAB — CBC WITH DIFFERENTIAL/PLATELET
Abs Immature Granulocytes: 0.05 10*3/uL (ref 0.00–0.07)
Basophils Absolute: 0 10*3/uL (ref 0.0–0.1)
Basophils Relative: 1 %
Eosinophils Absolute: 0.4 10*3/uL (ref 0.0–0.5)
Eosinophils Relative: 5 %
HCT: 32.3 % — ABNORMAL LOW (ref 39.0–52.0)
Hemoglobin: 9.9 g/dL — ABNORMAL LOW (ref 13.0–17.0)
Immature Granulocytes: 1 %
Lymphocytes Relative: 26 %
Lymphs Abs: 1.8 10*3/uL (ref 0.7–4.0)
MCH: 30.6 pg (ref 26.0–34.0)
MCHC: 30.7 g/dL (ref 30.0–36.0)
MCV: 99.7 fL (ref 80.0–100.0)
Monocytes Absolute: 0.8 10*3/uL (ref 0.1–1.0)
Monocytes Relative: 12 %
Neutro Abs: 3.8 10*3/uL (ref 1.7–7.7)
Neutrophils Relative %: 55 %
Platelets: 185 10*3/uL (ref 150–400)
RBC: 3.24 MIL/uL — ABNORMAL LOW (ref 4.22–5.81)
RDW: 14.1 % (ref 11.5–15.5)
WBC: 6.9 10*3/uL (ref 4.0–10.5)
nRBC: 0 % (ref 0.0–0.2)

## 2021-05-06 LAB — PROTIME-INR
INR: 1.1 (ref 0.8–1.2)
Prothrombin Time: 14.1 seconds (ref 11.4–15.2)

## 2021-05-06 LAB — APTT: aPTT: 28 seconds (ref 24–36)

## 2021-05-06 LAB — TROPONIN I (HIGH SENSITIVITY)
Troponin I (High Sensitivity): 12 ng/L (ref <18)
Troponin I (High Sensitivity): 13 ng/L (ref <18)

## 2021-05-06 LAB — CBG MONITORING, ED: Glucose-Capillary: 142 mg/dL — ABNORMAL HIGH (ref 70–99)

## 2021-05-06 LAB — RAPID URINE DRUG SCREEN, HOSP PERFORMED
Amphetamines: NOT DETECTED
Barbiturates: NOT DETECTED
Benzodiazepines: NOT DETECTED
Cocaine: NOT DETECTED
Opiates: NOT DETECTED
Tetrahydrocannabinol: NOT DETECTED

## 2021-05-06 LAB — RESP PANEL BY RT-PCR (FLU A&B, COVID) ARPGX2
Influenza A by PCR: NEGATIVE
Influenza B by PCR: NEGATIVE
SARS Coronavirus 2 by RT PCR: POSITIVE — AB

## 2021-05-06 LAB — SEDIMENTATION RATE: Sed Rate: 3 mm/hr (ref 0–16)

## 2021-05-06 LAB — POC OCCULT BLOOD, ED: Fecal Occult Bld: NEGATIVE

## 2021-05-06 LAB — C-REACTIVE PROTEIN: CRP: 0.6 mg/dL (ref <1.0)

## 2021-05-06 LAB — VITAMIN B12: Vitamin B-12: 564 pg/mL (ref 180–914)

## 2021-05-06 LAB — HIV ANTIBODY (ROUTINE TESTING W REFLEX): HIV Screen 4th Generation wRfx: NONREACTIVE

## 2021-05-06 LAB — ETHANOL: Alcohol, Ethyl (B): <10 mg/dL (ref <10)

## 2021-05-06 LAB — TSH: TSH: 1.416 u[IU]/mL (ref 0.350–4.500)

## 2021-05-06 LAB — FOLATE: Folate: 7.4 ng/mL (ref >5.9)

## 2021-05-06 MED ORDER — BUDESON-GLYCOPYRROL-FORMOTEROL 160-9-4.8 MCG/ACT IN AERO: 2.0000 | INHALATION_SPRAY | Freq: Two times a day (BID) | RESPIRATORY_TRACT | Status: DC

## 2021-05-06 MED ORDER — BRIMONIDINE TARTRATE 0.15 % OP SOLN
1.0000 [drp] | Freq: Every day | OPHTHALMIC | Status: DC
  Administered 2021-05-07: 1 [drp] via OPHTHALMIC
  Filled 2021-05-06: qty 5

## 2021-05-06 MED ORDER — GUAIFENESIN-DM 100-10 MG/5ML PO SYRP: 10.0000 mL | ORAL_SOLUTION | ORAL | Status: DC | PRN

## 2021-05-06 MED ORDER — POLYETHYLENE GLYCOL 3350 17 G PO PACK: 17.0000 g | PACK | Freq: Every day | ORAL | Status: DC | PRN

## 2021-05-06 MED ORDER — DORZOLAMIDE HCL-TIMOLOL MAL 2-0.5 % OP SOLN
1.0000 [drp] | Freq: Every day | OPHTHALMIC | Status: DC
  Administered 2021-05-07: 1 [drp] via OPHTHALMIC
  Filled 2021-05-06: qty 10

## 2021-05-06 MED ORDER — AZITHROMYCIN 250 MG PO TABS: 500.0000 mg | ORAL_TABLET | ORAL | Status: DC

## 2021-05-06 MED ORDER — LORATADINE 10 MG PO TABS
10.0000 mg | ORAL_TABLET | Freq: Every day | ORAL | Status: DC
  Administered 2021-05-07: 10 mg via ORAL
  Filled 2021-05-06: qty 1

## 2021-05-06 MED ORDER — AMLODIPINE BESYLATE 5 MG PO TABS
5.0000 mg | ORAL_TABLET | Freq: Every day | ORAL | Status: DC
  Administered 2021-05-06 – 2021-05-07 (×2): 5 mg via ORAL
  Filled 2021-05-06 (×2): qty 1

## 2021-05-06 MED ORDER — METOPROLOL TARTRATE 25 MG PO TABS
50.0000 mg | ORAL_TABLET | Freq: Two times a day (BID) | ORAL | Status: DC
  Administered 2021-05-06 – 2021-05-07 (×2): 50 mg via ORAL
  Filled 2021-05-06 (×2): qty 2

## 2021-05-06 MED ORDER — ALBUTEROL SULFATE HFA 108 (90 BASE) MCG/ACT IN AERS: 2.0000 | INHALATION_SPRAY | RESPIRATORY_TRACT | Status: DC | PRN

## 2021-05-06 MED ORDER — SODIUM CHLORIDE 0.9 % IV SOLN
1.0000 g | Freq: Once | INTRAVENOUS | Status: AC
  Administered 2021-05-06: 1 g via INTRAVENOUS
  Filled 2021-05-06: qty 10

## 2021-05-06 MED ORDER — LATANOPROST 0.005 % OP SOLN
1.0000 [drp] | Freq: Every day | OPHTHALMIC | Status: DC
  Administered 2021-05-07: 1 [drp] via OPHTHALMIC
  Filled 2021-05-06: qty 2.5

## 2021-05-06 MED ORDER — ONDANSETRON HCL 4 MG PO TABS: 4.0000 mg | ORAL_TABLET | Freq: Four times a day (QID) | ORAL | Status: DC | PRN

## 2021-05-06 MED ORDER — LEVETIRACETAM 500 MG PO TABS
500.0000 mg | ORAL_TABLET | Freq: Two times a day (BID) | ORAL | Status: DC
  Administered 2021-05-06 – 2021-05-07 (×2): 500 mg via ORAL
  Filled 2021-05-06 (×2): qty 1

## 2021-05-06 MED ORDER — LORAZEPAM 2 MG/ML IJ SOLN
0.5000 mg | Freq: Once | INTRAMUSCULAR | Status: AC
  Administered 2021-05-06: 0.5 mg via INTRAVENOUS
  Filled 2021-05-06: qty 1

## 2021-05-06 MED ORDER — SODIUM CHLORIDE 0.9 % IV SOLN
500.0000 mg | Freq: Once | INTRAVENOUS | Status: AC
  Administered 2021-05-06: 500 mg via INTRAVENOUS
  Filled 2021-05-06: qty 500

## 2021-05-06 MED ORDER — FLUTICASONE FUROATE-VILANTEROL 200-25 MCG/INH IN AEPB
1.0000 | INHALATION_SPRAY | Freq: Every day | RESPIRATORY_TRACT | Status: DC
  Filled 2021-05-06: qty 28

## 2021-05-06 MED ORDER — UMECLIDINIUM BROMIDE 62.5 MCG/INH IN AEPB
1.0000 | INHALATION_SPRAY | Freq: Every day | RESPIRATORY_TRACT | Status: DC
  Filled 2021-05-06: qty 7

## 2021-05-06 MED ORDER — ASCORBIC ACID 500 MG PO TABS
500.0000 mg | ORAL_TABLET | Freq: Every day | ORAL | Status: DC
  Administered 2021-05-07: 500 mg via ORAL
  Filled 2021-05-06: qty 1

## 2021-05-06 MED ORDER — SODIUM CHLORIDE 0.9 % IV SOLN: INTRAVENOUS | Status: AC

## 2021-05-06 MED ORDER — TAMSULOSIN HCL 0.4 MG PO CAPS
0.4000 mg | ORAL_CAPSULE | Freq: Every day | ORAL | Status: DC
  Administered 2021-05-07: 0.4 mg via ORAL
  Filled 2021-05-06: qty 1

## 2021-05-06 MED ORDER — ENOXAPARIN SODIUM 40 MG/0.4ML IJ SOSY
40.0000 mg | PREFILLED_SYRINGE | INTRAMUSCULAR | Status: DC
  Administered 2021-05-06: 40 mg via SUBCUTANEOUS
  Filled 2021-05-06: qty 0.4

## 2021-05-06 MED ORDER — ATORVASTATIN CALCIUM 40 MG PO TABS
40.0000 mg | ORAL_TABLET | Freq: Every day | ORAL | Status: DC
  Administered 2021-05-07: 40 mg via ORAL
  Filled 2021-05-06: qty 1

## 2021-05-06 MED ORDER — FLUTICASONE PROPIONATE 50 MCG/ACT NA SUSP
2.0000 | Freq: Every day | NASAL | Status: DC
  Filled 2021-05-06: qty 16

## 2021-05-06 MED ORDER — ZINC SULFATE 220 (50 ZN) MG PO CAPS
220.0000 mg | ORAL_CAPSULE | Freq: Every day | ORAL | Status: DC
  Administered 2021-05-07: 220 mg via ORAL
  Filled 2021-05-06: qty 1

## 2021-05-06 MED ORDER — ACETAMINOPHEN 325 MG PO TABS: 650.0000 mg | ORAL_TABLET | Freq: Four times a day (QID) | ORAL | Status: DC | PRN

## 2021-05-06 MED ORDER — ONDANSETRON HCL 4 MG/2ML IJ SOLN: 4.0000 mg | Freq: Four times a day (QID) | INTRAMUSCULAR | Status: DC | PRN

## 2021-05-06 MED ORDER — DICYCLOMINE HCL 10 MG PO CAPS
10.0000 mg | ORAL_CAPSULE | Freq: Two times a day (BID) | ORAL | Status: DC
  Administered 2021-05-06 – 2021-05-07 (×2): 10 mg via ORAL
  Filled 2021-05-06 (×2): qty 1

## 2021-05-06 MED ORDER — ASPIRIN EC 81 MG PO TBEC
81.0000 mg | DELAYED_RELEASE_TABLET | Freq: Every day | ORAL | Status: DC
  Administered 2021-05-06 – 2021-05-07 (×2): 81 mg via ORAL
  Filled 2021-05-06 (×2): qty 1

## 2021-05-06 NOTE — ED Triage Notes (Signed)
PT arrives via ems from home. EMS states that the wife reports the patient has had confusion and slurred speech since 1200 yesterday. PT is alert and oriented x 4 and reports his speech is his normal. Denies any complaints

## 2021-05-06 NOTE — ED Provider Notes (Signed)
[name] Provider Note   CSN: [id number] Arrival date & time: 05/06/21  1347     History No chief complaint on file.   Darrell [name] is a 70 y.o. male with a past medical history with a past medical history of CKD, GERD, ALS, prior stroke presenting to the ED with a chief complaint of slurred speech.  Wife states that yesterday at noon he started noticing that he sounded slurred.  He was not confused.  Yesterday he did "have energy."  Today his slurred speech continued and now he is "low energy and fatigue."  She denies any recent injury or falls.  Patient states that he is at his baseline.  States that he is not having a headache, blurry vision, numbness in his arms or legs.  No new medication changes.  Denies any chest pain, shortness of breath, fever.  HPI     Past Medical History:  Diagnosis Date   Arthritis    At high risk for falls 08/16/2015   Bradycardia    Chronic kidney disease    stage 3 GFR 30-59 ml/min    CIDP (chronic inflammatory demyelinating polyneuropathy) ([name])    CKD (chronic kidney disease) stage 3, GFR 30-59 ml/min ([name]) 08/16/2015   Dysphagia as late effect of cerebrovascular disease    pts wife states pt has to eat soft foods    Elevated liver enzymes 08/10/2016   GERD (gastroesophageal reflux disease)    Glaucoma    High cholesterol    History of CVA with residual deficit 03/25/2013   Hypertension    Hypertensive retinopathy of both eyes 01/16/2017   Inguinal hernia 03/25/2013   Liver hemangioma 08/14/2016   Neuromuscular disorder ([place])    chronic inflammatory demyelinating polyneuropathy    New onset seizure ([place]) 07/08/2017   seizure 07/14/18   Nuclear sclerosis of both eyes 10/25/2016   Presence of permanent cardiac pacemaker    placed in april 2018   Primary open angle glaucoma of both eyes, indeterminate stage 10/25/2016   Renal mass, right 08/14/2016   Status cardiac pacemaker 01/29/2017   Placed for  second degree heart block on 01/16/17 Medtronic Azure XT DR MRI SureScan dual-chamber pacemaker   Stroke [facility])    2011 with residual deficit left sided weakness   Tobacco dependence     Patient Active Problem List   Diagnosis Date Noted   Ulcer of ankle ([place]) 03/29/2021   CKD (chronic kidney disease), stage IV ([place]) 03/09/2021   Cognitive communication deficit 01/10/2021   Chronic bronchitis ([place]) 05/25/2020   Chronic cough 05/25/2020   Goals of care, counseling/discussion    Palliative care by specialist    ARF (acute renal failure) ([place]) 05/05/2019   Pain due to onychomycosis of toenails of both feet 03/25/2019   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[place]) 07/24/2018   Dysarthria 05/21/2018   Dysphagia 05/21/2018   Gait abnormality 05/21/2018   Spasticity 05/21/2018   GERD (gastroesophageal reflux disease) 07/21/2017   Hyperlipidemia 07/21/2017   Bradycardia    Seizures ([place]) 07/08/2017   Status cardiac pacemaker 01/29/2017   Unintended weight loss 01/29/2017   Tinea pedis of right foot 01/29/2017   Hypertensive retinopathy of both eyes 01/16/2017   Hypercalcemia 01/15/2017   Heart block 01/15/2017   Nuclear sclerosis of both eyes 10/25/2016   Primary open angle glaucoma of both eyes, indeterminate stage 10/25/2016   Glaucoma 09/12/2016   Liver hemangioma 08/14/2016  Renal cyst 08/14/2016   Renal mass, right 08/14/2016   Elevated liver enzymes 08/10/2016   CKD (chronic kidney disease) stage 3, GFR 30-59 ml/min ([name]) 08/16/2015   At high risk for falls 08/16/2015   Subcutaneous nodules 08/16/2015   Prediabetes 10/07/2013   Inguinal hernia 03/25/2013   History of CVA with residual deficit 03/25/2013   HTN (hypertension) 10/09/2012   Tobacco abuse 10/09/2012    Past Surgical History:  Procedure Laterality Date   EYE SURGERY     HERNIA REPAIR     INGUINAL HERNIA REPAIR Right 11/23/2014   Procedure: right inguinal hernia repair  with mesh;  Surgeon: Armandina Gemma, MD;  Location: [facility];  Service: General;  Laterality: Right;   INSERTION OF MESH N/A 11/23/2014   Procedure: INSERTION OF MESH;  Surgeon: Armandina Gemma, MD;  Location: [facility];  Service: General;  Laterality: N/A;   MASS EXCISION Left 08/29/2017   Procedure: EXCISION OF LEFT NECK MASS;  Surgeon: Coralie Keens, MD;  Location: [facility];  Service: General;  Laterality: Left;   PACEMAKER IMPLANT N/A 01/16/2017   Procedure: Pacemaker Implant;  Surgeon: Will Meredith Leeds, MD;  Location: [facility];  Service: Cardiovascular;  Laterality: N/A;   SHOULDER SURGERY Bilateral 1988, 1998       Family History  Problem Relation Age of Onset   Hypertension Mother    Diabetes Sister    Hypertension Sister    Cancer Sister        1 sister   COPD Sister        in 1 sister   Stomach cancer Neg Hx    Colon cancer Neg Hx    Pancreatic cancer Neg Hx    Esophageal cancer Neg Hx     Social History   Tobacco Use   Smoking status: Every Day    Packs/day: 1.00    Years: 40.00    Pack years: 40.00    Types: Cigarettes   Smokeless tobacco: Never   Tobacco comments:    12/13/20 5 a day  Vaping Use   Vaping Use: Never used  Substance Use Topics   Alcohol use: No    Comment: alcohol free for 1 year was drinking 1/2 pint per day    Drug use: No    Home Medications Prior to Admission medications   Medication Sig Start Date End Date Taking? Authorizing Provider  albuterol (VENTOLIN HFA) 108 (90 Base) MCG/ACT inhaler Inhale 1-2 puffs into the lungs every 6 (six) hours as needed for wheezing or shortness of breath. 01/12/21   Argentina Donovan, PA-C  albuterol (VENTOLIN HFA) 108 (90 Base) MCG/ACT inhaler Inhale 2 puffs into the lungs every 6 (six) hours as needed for up to 30 days for Wheezing. 01/18/21     amLODipine (NORVASC) 5 MG tablet Take 1 tablet (5 mg total) by mouth daily. 04/27/21   [name], MD  aspirin EC 81 MG tablet Take 1 tablet (81 mg total) by  mouth daily. 02/20/17   Funches, Adriana Mccallum, MD  atorvastatin (LIPITOR) 40 MG tablet Take 1 tablet (40 mg total) by mouth daily. 04/27/21   [name], MD  azithromycin (ZITHROMAX) 500 MG tablet Take by mouth daily. 12/13/20 take 1 tab every Mon, Wed, Fri    [provider]  azithromycin (ZITHROMAX) 500 MG tablet Take 1 tablet (500 mg total) by mouth 3 (three) times a week. 01/18/21     brimonidine (ALPHAGAN P) 0.1 % SOLN Place 1 drop into both  eyes daily.     [provider]  Budeson-Glycopyrrol-Formoterol (BREZTRI AEROSPHERE) 160-9-4.8 MCG/ACT AERO Inhale 2 puffs into the lungs 2 (two) times daily. 04/27/21   [name], MD  cetirizine (ZYRTEC) 10 MG tablet Take 1 tablet (10 mg total) by mouth daily. 01/12/21   Argentina Donovan, PA-C  dicyclomine (BENTYL) 10 MG capsule Take 1 capsule (10 mg total) by mouth in the morning and at bedtime. 12/27/20   [name], MD  dorzolamide-timolol (COSOPT) 22.3-6.8 MG/ML ophthalmic solution Place 1 drop into both eyes daily.     [provider]  FEROSUL 325 (65 Fe) MG tablet TAKE 1 TABLET BY MOUTH TWICE DAILY WITH A MEAL 04/18/21   [name], MD  fluticasone (FLONASE) 50 MCG/ACT nasal spray Place 2 sprays into both nostrils daily. 01/12/21   Argentina Donovan, PA-C  glycopyrrolate (ROBINUL) 1 MG tablet Take 1 tablet (1 mg total) by mouth 2 times daily. 05/04/21     guaiFENesin-dextromethorphan (ROBITUSSIN DM) 100-10 MG/5ML syrup Take 5 mLs by mouth every 6 (six) hours as needed for cough. 11/06/19   [name], MD  hydrOXYzine (ATARAX/VISTARIL) 25 MG tablet Take 1 tablet (25 mg total) by mouth at bedtime. 08/02/20   [name], MD  latanoprost (XALATAN) 0.005 % ophthalmic solution Place 1 drop into both eyes at bedtime.    [provider]  levETIRAcetam (KEPPRA) 500 MG tablet Take 1 tablet (500 mg total) by mouth 2 (two) times daily. 12/13/20   Penumalli, Earlean Polka, MD  lidocaine (LIDODERM) 5 % Apply patch to  painful area. Patch may remain in place for up to 12 hours in a 24 hour period. 05/04/21     meloxicam (MOBIC) 15 MG tablet Take 1 tablet by mouth once daily 04/06/21   Edrick Kins, DPM  metoprolol tartrate (LOPRESSOR) 50 MG tablet Take 1 tablet by mouth twice daily 11/15/20   Constance Haw, MD  Misc. Devices MISC Yankauer suction. Dx: ALS 11/11/19   [name], MD  Misc. Devices MISC Coughalator 04/01/20   [name], MD  Misc. Devices MISC Nebulizer machine.  Diagnosis- Motor neuron disease 04/01/20   [name], MD  Misc. [place] [facility] Bed RJJ88.41 06/01/20   [name], MD  Misc. [facility] bed extension. Diagnosis: ALS. Height 6'4" 06/10/20   [name], MD  Misc. Devices MISC Manual wheelchair.  Diagnosis-ALS 05/04/21   [name], MD  Multiple Vitamin (MULTI-VITAMIN) tablet Take by mouth.    [provider]  Multiple Vitamins-Iron (DAILY VITAMIN FORMULA+IRON) TABS Take 1 tablet by mouth daily.    [provider]  Multiple Vitamins-Iron (MULTIVITAMINS WITH IRON) TABS tablet Take 1 tablet by mouth.    [provider]  tamsulosin (FLOMAX) 0.4 MG CAPS capsule Take 1 capsule by mouth once daily 03/25/21   [name], MD    Allergies    Ace inhibitors, Doxycycline, Atacand hct [candesartan cilexetil-hctz], and Shellfish allergy  Review of Systems   Review of Systems  Constitutional:  Positive for fatigue. Negative for appetite change, chills and fever.  HENT:  Negative for ear pain, rhinorrhea, sneezing and sore throat.   Eyes:  Negative for photophobia and visual disturbance.  Respiratory:  Negative for cough, chest tightness, shortness of breath and wheezing.   Cardiovascular:  Negative for chest pain and palpitations.  Gastrointestinal:  Negative for abdominal pain, blood in stool, constipation, diarrhea, nausea and vomiting.  Genitourinary:  Negative for dysuria, hematuria and urgency.  Musculoskeletal:  Negative for myalgias.  Skin:  Negative for rash.  Neurological:  Positive for speech difficulty. Negative for dizziness, weakness and light-headedness.   Physical Exam Updated Vital Signs BP (!) 153/81   Pulse 79   Temp 98.7 F (37.1 C) (Oral)   Resp 19   SpO2 100%   Physical Exam Vitals and nursing note reviewed.  Constitutional:      General: He is not in acute distress.    Appearance: He is well-developed.  HENT:     Head: Normocephalic and atraumatic.     Nose: Nose normal.  Eyes:     General: No scleral icterus.       Left eye: No discharge.     Conjunctiva/sclera: Conjunctivae normal.  Cardiovascular:     Rate and Rhythm: Normal rate and regular rhythm.     Heart sounds: Normal heart sounds. No murmur heard.   No friction rub. No gallop.  Pulmonary:     Effort: Pulmonary effort is normal. No respiratory distress.     Breath sounds: Normal breath sounds.  Abdominal:     General: Bowel sounds are normal. There is no distension.     Palpations: Abdomen is soft.     Tenderness: There is no abdominal tenderness. There is no guarding.  Genitourinary:    Comments: DRE with black stool.  No tenderness. Musculoskeletal:        General: Normal range of motion.     Cervical back: Normal range of motion and neck supple.  Skin:    General: Skin is warm and dry.     Findings: No rash.  Neurological:     Mental Status: He is alert and oriented to person, place, and time.     Cranial Nerves: No cranial nerve deficit.     Sensory: No sensory deficit.     Motor: No abnormal muscle tone.     Coordination: Coordination normal.     Comments: Patient is not aphasic. Speech is slow but unsure of baseline.  No facial asymmetry.  Strength 5/5 in bilateral upper and lower extremities.  Normal sensation to light touch of bilateral upper and lower extremities, face.    ED Results / Procedures / Treatments   Labs (all labs ordered are listed, but only abnormal  results are displayed) Labs Reviewed  COMPREHENSIVE METABOLIC PANEL - Abnormal; Notable for the following components:      Result Value   Chloride 116 (*)    CO2 21 (*)    Glucose, Bld 123 (*)    BUN 28 (*)    Creatinine, Ser 1.73 (*)    Calcium 7.9 (*)    Total Protein 4.7 (*)    Albumin 2.6 (*)    AST 11 (*)    Total Bilirubin 0.2 (*)    GFR, Estimated 42 (*)    Anion gap 4 (*)    All other components within normal limits  CBC WITH DIFFERENTIAL/PLATELET - Abnormal; Notable for the following components:   RBC 3.24 (*)    Hemoglobin 9.9 (*)    HCT 32.3 (*)    All other components within normal limits  CBG MONITORING, ED - Abnormal; Notable for the following components:   Glucose-Capillary 142 (*)    All other components within normal limits  URINE CULTURE  URINALYSIS, ROUTINE W REFLEX MICROSCOPIC  POC OCCULT BLOOD, ED  TROPONIN I (HIGH SENSITIVITY)    EKG None  Radiology CT HEAD WO CONTRAST (5MM)  Result Date: 05/06/2021 CLINICAL  DATA:  Transient ischemic attack (TIA). Additional history provided: Patient's wife reports patient had confusion and slurred speech since yesterday at noon. EXAM: CT HEAD WITHOUT CONTRAST TECHNIQUE: Contiguous axial images were obtained from the base of the skull through the vertex without intravenous contrast. COMPARISON:  Prior head CT examinations 07/14/2018 and earlier. Brain MRI 10/09/2012. FINDINGS: Brain: Moderate generalized cerebral and cerebellar atrophy. A small cortical/subcortical infarct within the left occipital lobe (PCA vascular territory) is new as compared to the head CT of 07/14/2018, but appears chronic. Known chronic lacunar infarcts within the bilateral cerebral hemispheric white matter and deep gray nuclei, some of which were better appreciated on the brain MRI of 10/09/2012. Background advanced patchy and ill-defined hypoattenuation within the cerebral white matter, nonspecific but compatible chronic small vessel ischemic  disease. Known small chronic infarcts within the bilateral cerebellar hemispheres. There is no acute intracranial hemorrhage. No extra-axial fluid collection. No evidence of an intracranial mass. No midline shift. Vascular: No hyperdense vessel.  Atherosclerotic calcifications. Skull: Normal. Negative for fracture or focal lesion. Sinuses/Orbits: Visualized orbits show no acute finding. Small volume frothy secretions and trace mucosal thickening within the right frontal sinus. Tiny mucous retention cyst within the left frontal sinus. Mild bilateral ethmoid sinus mucosal thickening. Mild mucosal thickening within the bilateral maxillary sinuses. Other: Nonspecific 12 mm cystic appearing cutaneous/subcutaneous lesion within the right cheek (series 5, image 10). IMPRESSION: No evidence of acute intracranial abnormality. A small cortical/subcortical infarct within the left occipital lobe (PCA vascular territory) is new as compared to the head CT of 07/14/2018, but appears chronic. Redemonstrated chronic cortically-based right PCA territory infarct. Redemonstrated chronic lacunar infarcts within the bilateral cerebral hemispheric white matter, deep gray nuclei and cerebellar hemispheres. Background advanced chronic small vessel ischemic changes within the cerebral white matter. Moderate generalized parenchymal atrophy. Paranasal sinus disease at the imaged levels, as described. Nonspecific 12 mm cystic appearing cutaneous/subcutaneous lesion within the right cheek. Direct visualization recommended. Electronically Signed   By: Kellie Simmering D.O.   On: 05/06/2021 15:30    Procedures Procedures   Medications Ordered in ED Medications - No data to display  ED Course  I have reviewed the triage vital signs and the nursing notes.  Pertinent labs & imaging results that were available during my care of the patient were reviewed by me and considered in my medical decision making (see chart for details).  Clinical  Course as of 05/06/21 1542  Fri May 06, 2021  1442 Glucose-Capillary(!): 142 [HK]  1500 Hemoglobin(!): 9.9 Lower than last month, was around 12. [HK]    Clinical Course User Index [HK] Delia Heady, PA-C   MDM Rules/Calculators/A&P                           70 year old male with past medical history of ALS, CKD, prior stroke, hypertension presenting to the ED for possible slurred speech since yesterday. Wife also reports low energy and fatigue today. Patient does not ambulate at baseline.  However she is also concerned as he is more agitated today.  Patient states that he is at his baseline.  He is denying any headache, blurry vision, chest pain, shortness of breath, numbness in arms or legs.  Wife denies any new medication changes.  No injury or trauma.  She is concerned because of his behavioral changes.  Symptoms essentially began yesterday at noon and have persisted.  On exam patient is alert and oriented.  He does have some  slow speech however he is not aphasic.  He has no facial asymmetry.  He is moving all extremities without difficulty.  CBG is normal here.  Patient will need blood work, urinalysis and imaging of the head to rule out stroke or other acute neurological cause.  Wife also states that he has had black stools for the past few months.  His PCP told him that it was due to his iron supplement.  On DRE patient with black stool noted.  He does have a drop in hemoglobin today. Care handed off to oncoming provider pending remainder of workup and reassessment. Consider MRI if CT of the head is negative for bleed or other explanation of symptoms from work up.    Portions of this note were generated with [employer]. Dictation errors may occur despite best attempts at proofreading.  Final Clinical Impression(s) / ED Diagnoses Final diagnoses:  Anemia, unspecified type    Rx / DC Orders ED Discharge Orders     None        Delia Heady, PA-C 05/06/21  1542    Truddie Hidden, MD 05/10/21 1102

## 2021-05-06 NOTE — ED Provider Notes (Signed)
I assumed care of patient from previous team at shift change, please see their note for full H&P.  Per note by Delia Heady PA-C "Darrell [name] is a 70 y.o. male with a past medical history with a past medical history of CKD, GERD, ALS, prior stroke presenting to the ED with a chief complaint of slurred speech.  Wife states that yesterday at noon he started noticing that he sounded slurred.  He was not confused.  Yesterday he did "have energy."  Today his slurred speech continued and now he is "low energy and fatigue."  She denies any recent injury or falls.  Patient states that he is at his baseline.  States that he is not having a headache, blurry vision, numbness in his arms or legs.  No new medication changes.  Denies any chest pain, shortness of breath, fever."  Chart review shows patient did recently get a prescription however his wife confirms he has not started this to try and help decrease saliva. He was also started on a 5-day prednisone burst on either the fifth or the sixth that his pulmonologist prescribed due to increased cough sputum production and shortness of breath. His behavior changes have been significant especially over the past 3 to 4 days, his wife reports that she is his primary caregiver, he is bedbound at baseline, and at that he has been acting violent towards her striking at her.  Physical Exam  BP (!) 158/87   Pulse 75   Temp 98.7 F (37.1 C) (Oral)   Resp 17   SpO2 100%   Patient is awake and alert.  Lying in bed.  He recommends that I speak with his significant other at bedside.  He gets very agitated easily, stating that his speech was not slurred yesterday and that he was just upset.  He does attempt to strike out towards his wife who was at bedside.  He does not clearly have slurred speech however does report multiple bizarre things.  ED Course/Procedures   Clinical Course as of 05/07/21 0101  Fri May 06, 2021  1442 Glucose-Capillary(!): 142 [HK]  1500  Hemoglobin(!): 9.9 Lower than last month, was around 12. [HK]  [id number] Neurology states that CT scan shows old stroke.   [EH]  2006 SARS Coronavirus 2 by RT PCR(!): POSITIVE [EH]    Clinical Course User Index [EH] Lorin Glass, PA-C [HK] Delia Heady, PA-C    Procedures CT HEAD WO CONTRAST (5MM)  Result Date: 05/06/2021 CLINICAL DATA:  Transient ischemic attack (TIA). Additional history provided: Patient's wife reports patient had confusion and slurred speech since yesterday at noon. EXAM: CT HEAD WITHOUT CONTRAST TECHNIQUE: Contiguous axial images were obtained from the base of the skull through the vertex without intravenous contrast. COMPARISON:  Prior head CT examinations 07/14/2018 and earlier. Brain MRI 10/09/2012. FINDINGS: Brain: Moderate generalized cerebral and cerebellar atrophy. A small cortical/subcortical infarct within the left occipital lobe (PCA vascular territory) is new as compared to the head CT of 07/14/2018, but appears chronic. Known chronic lacunar infarcts within the bilateral cerebral hemispheric white matter and deep gray nuclei, some of which were better appreciated on the brain MRI of 10/09/2012. Background advanced patchy and ill-defined hypoattenuation within the cerebral white matter, nonspecific but compatible chronic small vessel ischemic disease. Known small chronic infarcts within the bilateral cerebellar hemispheres. There is no acute intracranial hemorrhage. No extra-axial fluid collection. No evidence of an intracranial mass. No midline shift. Vascular: No hyperdense vessel.  Atherosclerotic calcifications. Skull:  Normal. Negative for fracture or focal lesion. Sinuses/Orbits: Visualized orbits show no acute finding. Small volume frothy secretions and trace mucosal thickening within the right frontal sinus. Tiny mucous retention cyst within the left frontal sinus. Mild bilateral ethmoid sinus mucosal thickening. Mild mucosal thickening within the bilateral  maxillary sinuses. Other: Nonspecific 12 mm cystic appearing cutaneous/subcutaneous lesion within the right cheek (series 5, image 10). IMPRESSION: No evidence of acute intracranial abnormality. A small cortical/subcortical infarct within the left occipital lobe (PCA vascular territory) is new as compared to the head CT of 07/14/2018, but appears chronic. Redemonstrated chronic cortically-based right PCA territory infarct. Redemonstrated chronic lacunar infarcts within the bilateral cerebral hemispheric white matter, deep gray nuclei and cerebellar hemispheres. Background advanced chronic small vessel ischemic changes within the cerebral white matter. Moderate generalized parenchymal atrophy. Paranasal sinus disease at the imaged levels, as described. Nonspecific 12 mm cystic appearing cutaneous/subcutaneous lesion within the right cheek. Direct visualization recommended. Electronically Signed   By: Kellie Simmering D.O.   On: 05/06/2021 15:30   DG Chest Portable 1 View  Result Date: 05/06/2021 CLINICAL DATA:  Shortness of breath EXAM: PORTABLE CHEST 1 VIEW COMPARISON:  04/13/2021 FINDINGS: Left-sided pacing device as before. Streaky perihilar opacity. No pleural effusion. Possible patchy airspace disease left base. No pneumothorax. IMPRESSION: Streaky perihilar opacity and possible patchy airspace disease at left lung base, question pneumonia, possible atypical or viral process. Electronically Signed   By: Donavan Foil M.D.   On: 05/06/2021 18:10   DG Chest Port 1 View  Result Date: 04/13/2021 CLINICAL DATA:  Central chest pain EXAM: PORTABLE CHEST 1 VIEW COMPARISON:  05/25/2020 FINDINGS: Single frontal view of the chest demonstrates stable dual lead pacer. Cardiac silhouette is unremarkable. No airspace disease, effusion, or pneumothorax. No acute bony abnormalities. IMPRESSION: 1. No acute intrathoracic process. Electronically Signed   By: Randa Ngo M.D.   On: 04/13/2021 23:43    Labs Reviewed  RESP  PANEL BY RT-PCR (FLU A&B, COVID) [id number] - Abnormal; Notable for the following components:      Result Value   SARS Coronavirus 2 by RT PCR POSITIVE (*)    All other components within normal limits  COMPREHENSIVE METABOLIC PANEL - Abnormal; Notable for the following components:   Chloride 116 (*)    CO2 21 (*)    Glucose, Bld 123 (*)    BUN 28 (*)    Creatinine, Ser 1.73 (*)    Calcium 7.9 (*)    Total Protein 4.7 (*)    Albumin 2.6 (*)    AST 11 (*)    Total Bilirubin 0.2 (*)    GFR, Estimated 42 (*)    Anion gap 4 (*)    All other components within normal limits  CBC WITH DIFFERENTIAL/PLATELET - Abnormal; Notable for the following components:   RBC 3.24 (*)    Hemoglobin 9.9 (*)    HCT 32.3 (*)    All other components within normal limits  CBG MONITORING, ED - Abnormal; Notable for the following components:   Glucose-Capillary 142 (*)    All other components within normal limits  URINE CULTURE  CULTURE, BLOOD (ROUTINE X 2)  CULTURE, BLOOD (ROUTINE X 2)  ETHANOL  PROTIME-INR  APTT  RAPID URINE DRUG SCREEN, HOSP PERFORMED  C-REACTIVE PROTEIN  FOLATE  SEDIMENTATION RATE  TSH  VITAMIN B12  PROCALCITONIN  HIV ANTIBODY (ROUTINE TESTING W REFLEX)  URINALYSIS, ROUTINE W REFLEX MICROSCOPIC  CBC WITH DIFFERENTIAL/PLATELET  COMPREHENSIVE METABOLIC PANEL  C-REACTIVE PROTEIN  D-DIMER, QUANTITATIVE  MAGNESIUM  POC OCCULT BLOOD, ED  TROPONIN I (HIGH SENSITIVITY)  TROPONIN I (HIGH SENSITIVITY)    Medications  aspirin EC tablet 81 mg (81 mg Oral Given 05/06/21 2303)  azithromycin (ZITHROMAX) tablet 500 mg (has no administration in time range)  amLODipine (NORVASC) tablet 5 mg (5 mg Oral Given 05/06/21 2303)  atorvastatin (LIPITOR) tablet 40 mg (40 mg Oral Not Given 05/06/21 2213)  metoprolol tartrate (LOPRESSOR) tablet 50 mg (50 mg Oral Given 05/06/21 2303)  dicyclomine (BENTYL) capsule 10 mg (10 mg Oral Given 05/06/21 2303)  tamsulosin (FLOMAX) capsule 0.4 mg (has no  administration in time range)  levETIRAcetam (KEPPRA) tablet 500 mg (500 mg Oral Given 05/06/21 2302)  loratadine (CLARITIN) tablet 10 mg (has no administration in time range)  fluticasone (FLONASE) 50 MCG/ACT nasal spray 2 spray (2 sprays Each Nare Not Given 05/06/21 2228)  brimonidine (ALPHAGAN) 0.15 % ophthalmic solution 1 drop (1 drop Both Eyes Given 05/07/21 0005)  dorzolamide-timolol (COSOPT) 22.3-6.8 MG/ML ophthalmic solution 1 drop (1 drop Both Eyes Given 05/07/21 0005)  latanoprost (XALATAN) 0.005 % ophthalmic solution 1 drop (1 drop Both Eyes Given 05/07/21 0005)  enoxaparin (LOVENOX) injection 40 mg (40 mg Subcutaneous Given 05/06/21 2302)  albuterol (VENTOLIN HFA) 108 (90 Base) MCG/ACT inhaler 2 puff (has no administration in time range)  guaiFENesin-dextromethorphan (ROBITUSSIN DM) 100-10 MG/5ML syrup 10 mL (has no administration in time range)  ascorbic acid (VITAMIN C) tablet 500 mg (500 mg Oral Not Given 05/06/21 2213)  zinc sulfate capsule 220 mg (220 mg Oral Not Given 05/06/21 2214)  acetaminophen (TYLENOL) tablet 650 mg (has no administration in time range)  polyethylene glycol (MIRALAX / GLYCOLAX) packet 17 g (has no administration in time range)  ondansetron (ZOFRAN) tablet 4 mg (has no administration in time range)    Or  ondansetron (ZOFRAN) injection 4 mg (has no administration in time range)  umeclidinium bromide (INCRUSE ELLIPTA) 62.5 MCG/INH 1 puff (has no administration in time range)  fluticasone furoate-vilanterol (BREO ELLIPTA) 200-25 MCG/INH 1 puff (has no administration in time range)  0.9 %  sodium chloride infusion ( Intravenous New Bag/Given 05/06/21 2301)  LORazepam (ATIVAN) injection 0.5 mg (0.5 mg Intravenous Given 05/06/21 1833)  cefTRIAXone (ROCEPHIN) 1 g in sodium chloride 0.9 % 100 mL IVPB (0 g Intravenous Stopped 05/06/21 2026)  azithromycin (ZITHROMAX) 500 mg in sodium chloride 0.9 % 250 mL IVPB (0 mg Intravenous Stopped 05/06/21 2147)     MDM   Plan is to  follow-up on Hemoccult which is negative. Plan is additionally to follow-up on CT scans, labs, UA, reevaluate. Patient is reevaluated.  Patient is agitated, given that he was recently on prednisone I suspect that he may have a element of mood changes and possibly psychosis from the prednisone.  This was ordered as he had been having shortness of breath.  Chest x-ray was obtained showing airspace opacities versus atypical infection. Patient is given a dose of Rocephin and azithromycin for possible CAP. COVID test is ordered which later came back positive. Patient will require admission.  I spoke with Dr. Tollie Eth who will see patient for admission.  Note: Portions of this report may have been transcribed using voice recognition software. Every effort was made to ensure accuracy; however, inadvertent computerized transcription errors may be present        Lorin Glass, PA-C 05/07/21 0107    Wyvonnia Dusky, MD 05/07/21 [phone]

## 2021-05-06 NOTE — H&P (Signed)
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 Tillman Abide) with secondary chronic substantial debility, prior CVA (2011), nicotine dependence, COPD and emphysema with chronic bronchitis, chronic kidney disease stage IIIb, hypertension, anemia of chronic disease, seizure disorder, second-degree heart block status post pacemaker (2018), hyperlipidemia, benign prostatic hyperplasia who presents to [facility] emergency department via EMS after exhibiting a 2-day history of increasing agitation and slurred speech.  Patient is an extremely poor historian and therefore the majority of the history has been obtained from the wife via phone conversation.  According to the wife, approximately 2 weeks ago he began to experience increasing frequency and severity of his chronic cough.  This was discussed with his primary care provider during the 8/3 visit and additionally patient had a phone discussion with his pulmonology clinic on 8/5.  At that time patient was placed on a 5-day course of 20 mg of oral prednisone daily.  Wife explains that patient's symptoms transiently improved but after the course of prednisone completed on 8/10 patient's cough has recurred "just as severe as when it started."  At the same time patient also began to exhibit increasing bouts of agitation, combativeness and episodes of slurred speech.  Patient's wife also reports associated delusional thinking with the patient frequently stating that he is "in jail" and requesting to escape his own home.  The symptoms continued to worsen over the following 24 to 48 hours until the wife  eventually contacted EMS on 8/12.  EMS promptly responded but did not activate a code stroke due to the patient being well outside of the tPA window.  Patient was then brought into [facility] emergency department for evaluation.  Upon evaluation in the emergency department noncontrast CT imaging of the head was performed revealing no evidence of acute disease.  Case was discussed with Dr. Rory Percy with neurology who felt the patient's behavioral changes may be secondary to patient's recent course of steroid therapy.  Chest x-ray was performed revealing a possible left lower lobe infiltrate.  COVID-19 PCR testing was found to be positive.  Due to patient's ongoing symptoms the hospitalist group has been called to assess the patient for admission to the hospital.  Review of Systems:   Review of Systems  Unable to perform ROS: Mental status change   Past Medical History:  Diagnosis Date   Arthritis    At high risk for falls 08/16/2015   Bradycardia    Chronic kidney disease    stage 3 GFR 30-59 ml/min    CIDP (chronic inflammatory demyelinating polyneuropathy) ([name])    CKD (chronic kidney disease) stage 3, GFR 30-59 ml/min ([place]) 08/16/2015   Dysphagia as late effect of cerebrovascular disease    pts wife states pt has to eat soft foods    Elevated liver enzymes 08/10/2016   GERD (gastroesophageal reflux disease)    Glaucoma    High cholesterol    History of CVA with residual deficit 03/25/2013   Hypertension    Hypertensive retinopathy of both eyes 01/16/2017   Inguinal hernia 03/25/2013   Liver hemangioma 08/14/2016   Neuromuscular disorder ([place])    chronic inflammatory demyelinating polyneuropathy  New onset seizure ([place]) 07/08/2017   seizure 07/14/18   Nuclear sclerosis of both eyes 10/25/2016   Presence of permanent cardiac pacemaker    placed in april 2018   Primary open angle glaucoma of both eyes, indeterminate stage 10/25/2016   Renal mass, right 08/14/2016   Status  cardiac pacemaker 01/29/2017   Placed for second degree heart block on 01/16/17 Medtronic Azure XT DR MRI SureScan dual-chamber pacemaker   Stroke [facility])    2011 with residual deficit left sided weakness   Tobacco dependence     Past Surgical History:  Procedure Laterality Date   EYE SURGERY     HERNIA REPAIR     INGUINAL HERNIA REPAIR Right 11/23/2014   Procedure: right inguinal hernia repair with mesh;  Surgeon: Armandina Gemma, MD;  Location: [facility] ORS;  Service: General;  Laterality: Right;   INSERTION OF MESH N/A 11/23/2014   Procedure: INSERTION OF MESH;  Surgeon: Armandina Gemma, MD;  Location: [facility];  Service: General;  Laterality: N/A;   MASS EXCISION Left 08/29/2017   Procedure: EXCISION OF LEFT NECK MASS;  Surgeon: Coralie Keens, MD;  Location: [place];  Service: General;  Laterality: Left;   PACEMAKER IMPLANT N/A 01/16/2017   Procedure: Pacemaker Implant;  Surgeon: Will Meredith Leeds, MD;  Location: [place] CV LAB;  Service: Cardiovascular;  Laterality: N/A;   SHOULDER SURGERY Bilateral 1988, 1998     reports that he has been smoking cigarettes. He has a 40.00 pack-year smoking history. He has never used smokeless tobacco. He reports that he does not drink alcohol and does not use drugs.  Allergies  Allergen Reactions   Ace Inhibitors Other (See Comments)    Hyperkalemia   Doxycycline Other (See Comments)    Hiccups, cough, nausea and emesis, elevated liver enzymes, elevated eosinophils, SOB concerning for early DRESS syndrome    Atacand Hct [Candesartan Cilexetil-Hctz] Hives   Shellfish Allergy Hives    Family History  Problem Relation Age of Onset   Hypertension Mother    Diabetes Sister    Hypertension Sister    Cancer Sister        1 sister   COPD Sister        in 1 sister   Stomach cancer Neg Hx    Colon cancer Neg Hx    Pancreatic cancer Neg Hx    Esophageal cancer Neg Hx      Prior to Admission medications   Medication Sig Start Date End Date Taking?  Authorizing Provider  albuterol (VENTOLIN HFA) 108 (90 Base) MCG/ACT inhaler Inhale 1-2 puffs into the lungs every 6 (six) hours as needed for wheezing or shortness of breath. 01/12/21   Argentina Donovan, PA-C  albuterol (VENTOLIN HFA) 108 (90 Base) MCG/ACT inhaler Inhale 2 puffs into the lungs every 6 (six) hours as needed for up to 30 days for Wheezing. 01/18/21     amLODipine (NORVASC) 5 MG tablet Take 1 tablet (5 mg total) by mouth daily. 04/27/21   [name], MD  aspirin EC 81 MG tablet Take 1 tablet (81 mg total) by mouth daily. 02/20/17   Funches, Adriana Mccallum, MD  atorvastatin (LIPITOR) 40 MG tablet Take 1 tablet (40 mg total) by mouth daily. 04/27/21   [name], MD  azithromycin (ZITHROMAX) 500 MG tablet Take by mouth daily. 12/13/20 take 1 tab every Mon, Wed, Fri    [provider]  azithromycin (ZITHROMAX) 500 MG tablet Take 1 tablet (500 mg total) by mouth 3 (three)  times a week. 01/18/21     brimonidine (ALPHAGAN P) 0.1 % SOLN Place 1 drop into both eyes daily.     [provider]  Budeson-Glycopyrrol-Formoterol (BREZTRI AEROSPHERE) 160-9-4.8 MCG/ACT AERO Inhale 2 puffs into the lungs 2 (two) times daily. 04/27/21   [name], MD  cetirizine (ZYRTEC) 10 MG tablet Take 1 tablet (10 mg total) by mouth daily. 01/12/21   Argentina Donovan, PA-C  dicyclomine (BENTYL) 10 MG capsule Take 1 capsule (10 mg total) by mouth in the morning and at bedtime. 12/27/20   [name], MD  dorzolamide-timolol (COSOPT) 22.3-6.8 MG/ML ophthalmic solution Place 1 drop into both eyes daily.     [provider]  FEROSUL 325 (65 Fe) MG tablet TAKE 1 TABLET BY MOUTH TWICE DAILY WITH A MEAL 04/18/21   [name], MD  fluticasone (FLONASE) 50 MCG/ACT nasal spray Place 2 sprays into both nostrils daily. 01/12/21   Argentina Donovan, PA-C  glycopyrrolate (ROBINUL) 1 MG tablet Take 1 tablet (1 mg total) by mouth 2 times daily. 05/04/21     guaiFENesin-dextromethorphan (ROBITUSSIN DM)  100-10 MG/5ML syrup Take 5 mLs by mouth every 6 (six) hours as needed for cough. 11/06/19   [name], MD  hydrOXYzine (ATARAX/VISTARIL) 25 MG tablet Take 1 tablet (25 mg total) by mouth at bedtime. 08/02/20   [name], MD  latanoprost (XALATAN) 0.005 % ophthalmic solution Place 1 drop into both eyes at bedtime.    [provider]  levETIRAcetam (KEPPRA) 500 MG tablet Take 1 tablet (500 mg total) by mouth 2 (two) times daily. 12/13/20   Penumalli, Earlean Polka, MD  lidocaine (LIDODERM) 5 % Apply patch to painful area. Patch may remain in place for up to 12 hours in a 24 hour period. 05/04/21     meloxicam (MOBIC) 15 MG tablet Take 1 tablet by mouth once daily 04/06/21   Edrick Kins, DPM  metoprolol tartrate (LOPRESSOR) 50 MG tablet Take 1 tablet by mouth twice daily 11/15/20   Constance Haw, MD  Misc. Devices MISC Yankauer suction. Dx: ALS 11/11/19   [name], MD  Misc. Devices MISC Coughalator 04/01/20   [name], MD  Misc. Devices MISC Nebulizer machine.  Diagnosis- Motor neuron disease 04/01/20   [name], MD  Misc. [place] [facility] Bed HEN27.78 06/01/20   [name], MD  Misc. [place] [facility] bed extension. Diagnosis: ALS. Height 6'4" 06/10/20   [name], MD  Misc. Devices MISC Manual wheelchair.  Diagnosis-ALS 05/04/21   [name], MD  Multiple Vitamin (MULTI-VITAMIN) tablet Take by mouth.    [provider]  Multiple Vitamins-Iron (DAILY VITAMIN FORMULA+IRON) TABS Take 1 tablet by mouth daily.    [provider]  Multiple Vitamins-Iron (MULTIVITAMINS WITH IRON) TABS tablet Take 1 tablet by mouth.    [provider]  tamsulosin (FLOMAX) 0.4 MG CAPS capsule Take 1 capsule by mouth once daily 03/25/21   [name], MD    Physical Exam: Vitals:   05/06/21 1800 05/06/21 1930 05/06/21 2000 05/06/21 2100  BP: (!) 158/87 (!) 184/74 (!) 156/77 (!) 162/85  Pulse: 75 72 72 73  Resp: 17 13 16 17    Temp:      TempSrc:      SpO2: 100% 99% 99% 99%    Constitutional: Lethargic but arousable, oriented x2,, no associated distress.   Skin: no rashes, no lesions, somewhat poor skin turgor noted. Eyes: Pupils are equally reactive to light.  No  evidence of scleral icterus or conjunctival pallor.  ENMT: Slightly dry mucous membranes noted.  Posterior pharynx clear of any exudate or lesions.   Neck: normal, supple, no masses, no thyromegaly.  No evidence of jugular venous distension.   Respiratory: Notable bibasilar rales without evidence of associated wheezing.   Normal respiratory effort. No accessory muscle use.  Cardiovascular: Regular rate and rhythm, no murmurs / rubs / gallops. No extremity edema. 2+ pedal pulses. No carotid bruits.  Chest:   Nontender without crepitus or deformity.   Back:   Nontender without crepitus or deformity. Abdomen: Abdomen is soft and nontender.  No evidence of intra-abdominal masses.  Positive bowel sounds noted in all quadrants.   Musculoskeletal: Extremely poor muscle tone noted.  Notable contracture of the left hand.  No joint deformity upper and lower extremities. Good ROM, no contractures. Normal muscle tone.  Neurologic: Notable weakness of the distal and proximal segments of the bilateral lower extremities.  Unable to assess strength of left hand due to contractures.  Patient is lethargic arousable and oriented x2.  Sensation grossly intact.  Patient is following all commands.  Patient is responsive to verbal stimuli.   Psychiatric: Patient exhibits normal mood with flat affect.  Patient currently does not seem to possess insight as to his current situation.  Labs on Admission: I have personally reviewed following labs and imaging studies -   CBC: Recent Labs  Lab 05/06/21 1448  WBC 6.9  NEUTROABS 3.8  HGB 9.9*  HCT 32.3*  MCV 99.7  PLT 952   Basic Metabolic Panel: Recent Labs  Lab 05/06/21 1448  NA 141  K 4.0  CL 116*  CO2 21*  GLUCOSE  123*  BUN 28*  CREATININE 1.73*  CALCIUM 7.9*   GFR: CrCl cannot be calculated (Unknown ideal weight.). Liver Function Tests: Recent Labs  Lab 05/06/21 1448  AST 11*  ALT 13  ALKPHOS 70  BILITOT 0.2*  PROT 4.7*  ALBUMIN 2.6*   No results for input(s): LIPASE, AMYLASE in the last 168 hours. No results for input(s): AMMONIA in the last 168 hours. Coagulation Profile: Recent Labs  Lab 05/06/21 1819  INR 1.1   Cardiac Enzymes: No results for input(s): CKTOTAL, CKMB, CKMBINDEX, TROPONINI in the last 168 hours. BNP (last 3 results) No results for input(s): PROBNP in the last 8760 hours. HbA1C: No results for input(s): HGBA1C in the last 72 hours. CBG: Recent Labs  Lab 05/06/21 1358  GLUCAP 142*   Lipid Profile: No results for input(s): CHOL, HDL, LDLCALC, TRIG, CHOLHDL, LDLDIRECT in the last 72 hours. Thyroid Function Tests: No results for input(s): TSH, T4TOTAL, FREET4, T3FREE, THYROIDAB in the last 72 hours. Anemia Panel: No results for input(s): VITAMINB12, FOLATE, FERRITIN, TIBC, IRON, RETICCTPCT in the last 72 hours. Urine analysis:    Component Value Date/Time   COLORURINE YELLOW 05/05/2019 2254   APPEARANCEUR CLEAR 05/05/2019 2254   LABSPEC 1.011 05/05/2019 2254   PHURINE 5.0 05/05/2019 2254   GLUCOSEU NEGATIVE 05/05/2019 2254   HGBUR NEGATIVE 05/05/2019 2254   BILIRUBINUR NEGATIVE 05/05/2019 2254   BILIRUBINUR negative 09/07/2017 [place] 05/05/2019 2254   PROTEINUR NEGATIVE 05/05/2019 2254   UROBILINOGEN 0.2 09/07/2017 1645   UROBILINOGEN 4.0 (H) 08/04/2016 1517   NITRITE NEGATIVE 05/05/2019 2254   LEUKOCYTESUR MODERATE (A) 05/05/2019 2254    Radiological Exams on Admission - Personally Reviewed: CT HEAD WO CONTRAST (5MM)  Result Date: 05/06/2021 CLINICAL DATA:  Transient ischemic attack (TIA). Additional history provided:  Patient's wife reports patient had confusion and slurred speech since yesterday at noon. EXAM: CT HEAD WITHOUT  CONTRAST TECHNIQUE: Contiguous axial images were obtained from the base of the skull through the vertex without intravenous contrast. COMPARISON:  Prior head CT examinations 07/14/2018 and earlier. Brain MRI 10/09/2012. FINDINGS: Brain: Moderate generalized cerebral and cerebellar atrophy. A small cortical/subcortical infarct within the left occipital lobe (PCA vascular territory) is new as compared to the head CT of 07/14/2018, but appears chronic. Known chronic lacunar infarcts within the bilateral cerebral hemispheric white matter and deep gray nuclei, some of which were better appreciated on the brain MRI of 10/09/2012. Background advanced patchy and ill-defined hypoattenuation within the cerebral white matter, nonspecific but compatible chronic small vessel ischemic disease. Known small chronic infarcts within the bilateral cerebellar hemispheres. There is no acute intracranial hemorrhage. No extra-axial fluid collection. No evidence of an intracranial mass. No midline shift. Vascular: No hyperdense vessel.  Atherosclerotic calcifications. Skull: Normal. Negative for fracture or focal lesion. Sinuses/Orbits: Visualized orbits show no acute finding. Small volume frothy secretions and trace mucosal thickening within the right frontal sinus. Tiny mucous retention cyst within the left frontal sinus. Mild bilateral ethmoid sinus mucosal thickening. Mild mucosal thickening within the bilateral maxillary sinuses. Other: Nonspecific 12 mm cystic appearing cutaneous/subcutaneous lesion within the right cheek (series 5, image 10). IMPRESSION: No evidence of acute intracranial abnormality. A small cortical/subcortical infarct within the left occipital lobe (PCA vascular territory) is new as compared to the head CT of 07/14/2018, but appears chronic. Redemonstrated chronic cortically-based right PCA territory infarct. Redemonstrated chronic lacunar infarcts within the bilateral cerebral hemispheric white matter, deep gray  nuclei and cerebellar hemispheres. Background advanced chronic small vessel ischemic changes within the cerebral white matter. Moderate generalized parenchymal atrophy. Paranasal sinus disease at the imaged levels, as described. Nonspecific 12 mm cystic appearing cutaneous/subcutaneous lesion within the right cheek. Direct visualization recommended. Electronically Signed   By: Kellie Simmering D.O.   On: 05/06/2021 15:30   DG Chest Portable 1 View  Result Date: 05/06/2021 CLINICAL DATA:  Shortness of breath EXAM: PORTABLE CHEST 1 VIEW COMPARISON:  04/13/2021 FINDINGS: Left-sided pacing device as before. Streaky perihilar opacity. No pleural effusion. Possible patchy airspace disease left base. No pneumothorax. IMPRESSION: Streaky perihilar opacity and possible patchy airspace disease at left lung base, question pneumonia, possible atypical or viral process. Electronically Signed   By: Donavan Foil M.D.   On: 05/06/2021 18:10    EKG: Personally reviewed.  Rhythm is atrial paced rhythm with heart rate of 74 bpm.  No dynamic ST segment changes appreciated.  Assessment/Plan Principal Problem:   Acute metabolic encephalopathy  Patient presenting with 2-day history of progressively worsening agitation, bouts of slurred speech and delusional thinking ER provider states that neurology felt that steroid-induced psychosis is possible.  I doubt this is the case however considering patient only received 20 mg of oral prednisone for 5 days. I feel of the more likely etiology is evaluate patient is COVID-positive, likely causing some degree of COVID-related encephalopathy Will additionally evaluate patient for possible superimposed bacterial pneumonia by obtaining blood cultures and procalcitonin.  If procalcitonin levels are markedly elevated we will continue intravenous antibacterials started by the emergency department. Gentle intravenous hydration CT imaging of the head revealed no evidence of acute  disease Vitamin B12, TSH, folate, urinalysis, urine toxicology screen all pending  Active Problems:   COVID-19 virus infection  Patient found to be COVID PCR positive Active COVID-19 infection may have been the  cause of the patient's increasing cough over the past several days. Is possibly contributing to the patient's presentation with encephalopathy No concurrent hypoxia and therefore no current indication for intravenous remdesivir or steroids A short course of 3 days of remdesivir can be considered. Airborne and contact precaution As needed albuterol MDI for wheezing As needed antitussives for cough Zinc and vitamin C segmentation    Essential hypertension  Continue home regimen of oral antihypertensive therapy    Chronic kidney disease, stage 3b ([name])  Strict intake and output monitoring Creatinine near baseline Minimizing nephrotoxic agents as much as possible Serial chemistries to monitor renal function and electrolytes    Seizure disorder ([name])  Continue home regimen of Keppra    Primary lateral sclerosis ([name])  Sensorimotor polyneuropathy with distal demyelinating features and has been developing since at least 2015 Symptoms have been felt to be most likely PLS Patient currently following with [facility] neurolog[facility] with Dr. Tillman Abide Limited mobility, typically wheelchair-bound at baseline Lives at home    Anemia, chronic disease  Hemoglobin near baseline No clinical evidence of bleed Monitor hemoglobin and hematocrit with serial CBCs    Benign prostatic hyperplasia  Continue home regimen of Flomax    Mixed hyperlipidemia  Continuing home regimen of lipid lowering therapy.    Code Status:  DNR Family Communication: Plan of care discussed with wife via phone conversation  Status is: Observation  The patient remains OBS appropriate and will d/c before 2 midnights.  Dispo: The patient is from: Home              Anticipated d/c is to: Home               Patient currently is not medically stable to d/c.   Difficult to place patient Yes        Vernelle Emerald MD [facility] Pager [phone]  If 7PM-7AM, please contact night-coverage [email or web address] Use universal [place] password for that web site. If you do not have the password, please call the hospital operator.  05/06/2021, 9:46 PM

## 2021-05-07 DIAGNOSIS — G9341 Metabolic encephalopathy: Secondary | ICD-10-CM | POA: Diagnosis not present

## 2021-05-07 LAB — COMPREHENSIVE METABOLIC PANEL
ALT: 12 U/L (ref 0–44)
AST: 12 U/L — ABNORMAL LOW (ref 15–41)
Albumin: 2.9 g/dL — ABNORMAL LOW (ref 3.5–5.0)
Alkaline Phosphatase: 72 U/L (ref 38–126)
Anion gap: 5 (ref 5–15)
BUN: 28 mg/dL — ABNORMAL HIGH (ref 8–23)
CO2: 20 mmol/L — ABNORMAL LOW (ref 22–32)
Calcium: 9 mg/dL (ref 8.9–10.3)
Chloride: 113 mmol/L — ABNORMAL HIGH (ref 98–111)
Creatinine, Ser: 1.82 mg/dL — ABNORMAL HIGH (ref 0.61–1.24)
GFR, Estimated: 40 mL/min — ABNORMAL LOW (ref >60)
Glucose, Bld: 75 mg/dL (ref 70–99)
Potassium: 4.9 mmol/L (ref 3.5–5.1)
Sodium: 138 mmol/L (ref 135–145)
Total Bilirubin: 0.6 mg/dL (ref 0.3–1.2)
Total Protein: 5.2 g/dL — ABNORMAL LOW (ref 6.5–8.1)

## 2021-05-07 LAB — CBC WITH DIFFERENTIAL/PLATELET
Abs Immature Granulocytes: 0.05 10*3/uL (ref 0.00–0.07)
Basophils Absolute: 0 10*3/uL (ref 0.0–0.1)
Basophils Relative: 0 %
Eosinophils Absolute: 0.5 10*3/uL (ref 0.0–0.5)
Eosinophils Relative: 7 %
HCT: 35.5 % — ABNORMAL LOW (ref 39.0–52.0)
Hemoglobin: 10.9 g/dL — ABNORMAL LOW (ref 13.0–17.0)
Immature Granulocytes: 1 %
Lymphocytes Relative: 28 %
Lymphs Abs: 2.1 10*3/uL (ref 0.7–4.0)
MCH: 29.9 pg (ref 26.0–34.0)
MCHC: 30.7 g/dL (ref 30.0–36.0)
MCV: 97.3 fL (ref 80.0–100.0)
Monocytes Absolute: 0.8 10*3/uL (ref 0.1–1.0)
Monocytes Relative: 11 %
Neutro Abs: 4 10*3/uL (ref 1.7–7.7)
Neutrophils Relative %: 53 %
Platelets: 209 10*3/uL (ref 150–400)
RBC: 3.65 MIL/uL — ABNORMAL LOW (ref 4.22–5.81)
RDW: 14.1 % (ref 11.5–15.5)
WBC: 7.4 10*3/uL (ref 4.0–10.5)
nRBC: 0 % (ref 0.0–0.2)

## 2021-05-07 LAB — PROCALCITONIN: Procalcitonin: <0.1 ng/mL

## 2021-05-07 LAB — MAGNESIUM: Magnesium: 1.6 mg/dL — ABNORMAL LOW (ref 1.7–2.4)

## 2021-05-07 LAB — C-REACTIVE PROTEIN: CRP: 0.7 mg/dL (ref <1.0)

## 2021-05-07 LAB — D-DIMER, QUANTITATIVE: D-Dimer, Quant: 4.75 ug/mL-FEU — ABNORMAL HIGH (ref 0.00–0.50)

## 2021-05-07 MED ORDER — GUAIFENESIN-DM 100-10 MG/5ML PO SYRP: 5.0000 mL | ORAL_SOLUTION | ORAL | 0 refills | Status: DC | PRN

## 2021-05-07 MED ORDER — ASCORBIC ACID 500 MG PO TABS: 500.0000 mg | ORAL_TABLET | Freq: Every day | ORAL | 0 refills | Status: AC

## 2021-05-07 MED ORDER — MAGNESIUM SULFATE 2 GM/50ML IV SOLN
2.0000 g | Freq: Once | INTRAVENOUS | Status: AC
  Administered 2021-05-07: 2 g via INTRAVENOUS
  Filled 2021-05-07: qty 50

## 2021-05-07 MED ORDER — ZINC SULFATE 220 (50 ZN) MG PO CAPS: 220.0000 mg | ORAL_CAPSULE | Freq: Every day | ORAL | 0 refills | Status: AC

## 2021-05-07 NOTE — ED Notes (Signed)
Pt takes pills whole with applesauce.

## 2021-05-07 NOTE — Discharge Summary (Signed)
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 Tillman Abide) with secondary chronic substantial debility, prior CVA (2011), nicotine dependence, COPD and emphysema with chronic bronchitis, chronic kidney disease stage IIIb, hypertension, anemia of chronic disease, seizure disorder, second-degree heart block status post pacemaker (2018), hyperlipidemia, benign prostatic hyperplasia who presents to [facility] emergency department via EMS after exhibiting a 2-day history of increasing agitation and slurred speech.   Patient is an extremely poor historian and therefore the majority of the history has been obtained from the wife via phone conversation.   According to the wife, approximately 2 weeks ago he began to experience increasing frequency and severity of his chronic cough.  This was discussed with his primary care provider during the 8/3 visit and additionally patient had a phone discussion with his pulmonology clinic on 8/5.  At that time patient was placed on a 5-day course of 20 mg of oral prednisone daily.   Wife explains that patient's symptoms transiently improved but after the course of prednisone completed on 8/10 patient's cough has recurred "just as severe as when it started."  At the same time patient also began to exhibit increasing bouts of agitation, combativeness and episodes of slurred speech.  Patient's wife also reports associated delusional thinking with the patient frequently stating that he is  "in jail" and requesting to escape his own home.   The symptoms continued to worsen over the following 24 to 48 hours until the wife eventually contacted EMS on 8/12.  EMS promptly responded but did not activate a code stroke due to the patient being well outside of the tPA window.  Patient was then brought into [facility] emergency department for evaluation.   Upon evaluation in the emergency department noncontrast CT imaging of the head was performed revealing no evidence of acute disease.  Case was discussed with Dr. Rory Percy with neurology who felt the patient's behavioral changes may be secondary to patient's recent course of steroid therapy.  Chest x-ray was performed revealing a possible left lower lobe infiltrate.  COVID-19 PCR testing was found to be positive.  Due to patient's ongoing symptoms the hospitalist group has been called to assess the patient for admission to the hospital.   Hospital Course:  His hospital course remained stable.  Patient was admitted for altered mental status,weakness and slurred speech.  During my evaluation today, he was completely alert and oriented and at baseline. Speech was fluent. Wife at the bedside.  Patient requesting repeatedly to be discharged to home. CT head on admission did not show any acute intracranial abnormalities. His COVID screen test was positive.  Chest x-ray showed possibility of a small patchy airspace disease at left lung base.  Patient is not hypoxic, maintaining 100% saturation on room air.   Lab work reviewed.  His vitamin G89, folic acid level, TSH are normal.  He has mildly elevated D-dimer level which could be from COVID illness.  His kidney function is at baseline.   blood work today showed magnesium of 1.6 for which he was given IV infusion of magnesium.  Patient was  not  in any kind of distress.  Patient is paraplegic and is bedbound and is taken care of by his wife at home.Uses wheelchair for ambulation. Since he is not  hypoxic and asymptomatic from [place] and since his mental status is at baseline, we discussed about discharge planning.  Wife is interested to take her home today and so is patient. Patient is azithromycin chronically at home, I recommend to follow-up with his PCP if that is indicated. Patient is medically stable for discharge home today.   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[place])   COVID-19 virus infection   Anemia, chronic disease   Benign prostatic hyperplasia    Discharge Instructions  Discharge Instructions     Diet general   Complete by: As directed    Dysphagia 3 diet   Discharge instructions   Complete by: As directed    1)Please follow up with your PCP in a week   Increase activity slowly   Complete by: As directed       Allergies as of 05/07/2021       Reactions   Ace Inhibitors Other (See Comments)   Hyperkalemia   Doxycycline Other (See Comments)   Hiccups, cough, nausea and emesis, elevated liver enzymes, elevated eosinophils, SOB concerning for early DRESS syndrome    Atacand Hct [candesartan Cilexetil-hctz] Hives   Shellfish Allergy Hives        Medication List     TAKE these medications    albuterol 108 (90 Base) MCG/ACT inhaler Commonly known as: VENTOLIN HFA Inhale 1-2 puffs into the lungs every 6 (six) hours as needed for wheezing or shortness of breath.   albuterol 108 (90 Base) MCG/ACT inhaler Commonly known as: VENTOLIN HFA Inhale 2 puffs into the lungs every 6 (six) hours as needed for up to 30 days for Wheezing.   amLODipine 5 MG tablet Commonly known as: NORVASC Take 1 tablet (5 mg total) by mouth daily.   ascorbic acid 500 MG tablet Commonly known as: VITAMIN C Take 1 tablet (500 mg total) by mouth daily for 7  days. Start taking on: May 08, 2021   aspirin EC 81 MG tablet Take 1 tablet (81 mg total) by mouth daily.   atorvastatin 40 MG tablet Commonly known as: LIPITOR Take 1 tablet (40 mg total) by mouth daily.   azithromycin 500 MG tablet Commonly known as: ZITHROMAX Take 1 tablet (500 mg total) by mouth 3 (three) times a week. What changed: Another medication with the same name was removed. Continue taking this medication, and follow the directions you see here.   Breztri Aerosphere 160-9-4.8 MCG/ACT Aero Generic drug: Budeson-Glycopyrrol-Formoterol Inhale 2 puffs into the lungs 2 (two) times daily.   brimonidine 0.1 % Soln Commonly known as: ALPHAGAN P Place 1 drop into both eyes daily.   cetirizine 10 MG tablet Commonly known as: ZYRTEC Take 1 tablet (10 mg total) by mouth daily.   Daily Vitamin Formula+Iron Tabs Take 1 tablet by mouth daily.   multivitamins with iron Tabs tablet Take 1 tablet by mouth.   dicyclomine 10 MG capsule Commonly known as: Bentyl Take 1 capsule (10 mg total) by mouth in the morning and at bedtime.   dorzolamide-timolol 22.3-6.8 MG/ML ophthalmic solution Commonly known as: COSOPT Place 1 drop into both eyes daily.  FeroSul 325 (65 FE) MG tablet Generic drug: ferrous sulfate TAKE 1 TABLET BY MOUTH TWICE DAILY WITH A MEAL   fluticasone 50 MCG/ACT nasal spray Commonly known as: FLONASE Place 2 sprays into both nostrils daily.   glycopyrrolate 1 MG tablet Commonly known as: ROBINUL Take 1 tablet (1 mg total) by mouth 2 times daily.   guaiFENesin-dextromethorphan 100-10 MG/5ML syrup Commonly known as: ROBITUSSIN DM Take 5 mLs by mouth every 4 (four) hours as needed for cough. What changed: when to take this   hydrOXYzine 25 MG tablet Commonly known as: ATARAX/VISTARIL Take 1 tablet (25 mg total) by mouth at bedtime.   latanoprost 0.005 % ophthalmic solution Commonly known as: XALATAN Place 1 drop into both eyes at bedtime.    levETIRAcetam 500 MG tablet Commonly known as: KEPPRA Take 1 tablet (500 mg total) by mouth 2 (two) times daily.   lidocaine 5 % Commonly known as: LIDODERM Apply patch to painful area. Patch may remain in place for up to 12 hours in a 24 hour period.   meloxicam 15 MG tablet Commonly known as: MOBIC Take 1 tablet by mouth once daily   metoprolol tartrate 50 MG tablet Commonly known as: LOPRESSOR Take 1 tablet by mouth twice daily   Misc. Devices Misc Yankauer suction. Dx: ALS   Misc. Devices Misc Coughalator   Misc. Devices Misc Nebulizer machine.  Diagnosis- Motor neuron disease   Misc. [place] Hospital Bed DXg12.20   [place]. [place] [facility] bed extension. Diagnosis: ALS. Height 6'4"   Misc. Devices Misc Manual wheelchair.  Diagnosis-ALS   Multi-Vitamin tablet Take by mouth.   tamsulosin 0.4 MG Caps capsule Commonly known as: FLOMAX Take 1 capsule by mouth once daily   zinc sulfate 220 (50 Zn) MG capsule Take 1 capsule (220 mg total) by mouth daily for 7 days. Start taking on: May 08, 2021        Follow-up Information     [name], MD. Schedule an appointment as soon as possible for a visit .   Specialty: Family Medicine Contact information: [place] [address] [phone]         [place] .   Specialty: Emergency Medicine Why: If symptoms worsen Contact information: [address] [id number] [place] [address] [phone]               Allergies  Allergen Reactions   Ace Inhibitors Other (See Comments)    Hyperkalemia   Doxycycline Other (See Comments)    Hiccups, cough, nausea and emesis, elevated liver enzymes, elevated eosinophils, SOB concerning for early DRESS syndrome    Atacand Hct [Candesartan Cilexetil-Hctz] Hives   Shellfish Allergy Hives    Consultations: None   Procedures/Studies: CT HEAD  WO CONTRAST (5MM)  Result Date: 05/06/2021 CLINICAL DATA:  Transient ischemic attack (TIA). Additional history provided: Patient's wife reports patient had confusion and slurred speech since yesterday at noon. EXAM: CT HEAD WITHOUT CONTRAST TECHNIQUE: Contiguous axial images were obtained from the base of the skull through the vertex without intravenous contrast. COMPARISON:  Prior head CT examinations 07/14/2018 and earlier. Brain MRI 10/09/2012. FINDINGS: Brain: Moderate generalized cerebral and cerebellar atrophy. A small cortical/subcortical infarct within the left occipital lobe (PCA vascular territory) is new as compared to the head CT of 07/14/2018, but appears chronic. Known chronic lacunar infarcts within the bilateral cerebral hemispheric white matter and deep gray nuclei, some of which were better appreciated  on the brain MRI of 10/09/2012. Background advanced patchy and ill-defined hypoattenuation within the cerebral white matter, nonspecific but compatible chronic small vessel ischemic disease. Known small chronic infarcts within the bilateral cerebellar hemispheres. There is no acute intracranial hemorrhage. No extra-axial fluid collection. No evidence of an intracranial mass. No midline shift. Vascular: No hyperdense vessel.  Atherosclerotic calcifications. Skull: Normal. Negative for fracture or focal lesion. Sinuses/Orbits: Visualized orbits show no acute finding. Small volume frothy secretions and trace mucosal thickening within the right frontal sinus. Tiny mucous retention cyst within the left frontal sinus. Mild bilateral ethmoid sinus mucosal thickening. Mild mucosal thickening within the bilateral maxillary sinuses. Other: Nonspecific 12 mm cystic appearing cutaneous/subcutaneous lesion within the right cheek (series 5, image 10). IMPRESSION: No evidence of acute intracranial abnormality. A small cortical/subcortical infarct within the left occipital lobe (PCA vascular territory) is new as  compared to the head CT of 07/14/2018, but appears chronic. Redemonstrated chronic cortically-based right PCA territory infarct. Redemonstrated chronic lacunar infarcts within the bilateral cerebral hemispheric white matter, deep gray nuclei and cerebellar hemispheres. Background advanced chronic small vessel ischemic changes within the cerebral white matter. Moderate generalized parenchymal atrophy. Paranasal sinus disease at the imaged levels, as described. Nonspecific 12 mm cystic appearing cutaneous/subcutaneous lesion within the right cheek. Direct visualization recommended. Electronically Signed   By: Kellie Simmering D.O.   On: 05/06/2021 15:30   DG Chest Portable 1 View  Result Date: 05/06/2021 CLINICAL DATA:  Shortness of breath EXAM: PORTABLE CHEST 1 VIEW COMPARISON:  04/13/2021 FINDINGS: Left-sided pacing device as before. Streaky perihilar opacity. No pleural effusion. Possible patchy airspace disease left base. No pneumothorax. IMPRESSION: Streaky perihilar opacity and possible patchy airspace disease at left lung base, question pneumonia, possible atypical or viral process. Electronically Signed   By: Donavan Foil M.D.   On: 05/06/2021 18:10   DG Chest Port 1 View  Result Date: 04/13/2021 CLINICAL DATA:  Central chest pain EXAM: PORTABLE CHEST 1 VIEW COMPARISON:  05/25/2020 FINDINGS: Single frontal view of the chest demonstrates stable dual lead pacer. Cardiac silhouette is unremarkable. No airspace disease, effusion, or pneumothorax. No acute bony abnormalities. IMPRESSION: 1. No acute intrathoracic process. Electronically Signed   By: Randa Ngo M.D.   On: 04/13/2021 23:43      Subjective: Patient seen and examined at the bedside this morning.  Alert and oriented.  Not agitated, speech is not slurred.  Discharge planning discussed with the patient and wife  Discharge Exam: Vitals:   05/07/21 0900 05/07/21 1200  BP: 135/69 (!) 147/94  Pulse: 70 (!) 55  Resp: 15 14  Temp:     SpO2: 100% 100%   Vitals:   05/07/21 0600 05/07/21 0800 05/07/21 0900 05/07/21 1200  BP: (!) 147/78 (!) 153/90 135/69 (!) 147/94  Pulse: 71 70 70 (!) 55  Resp: 15 19 15 14   Temp:      TempSrc:      SpO2: 96% 100% 100% 100%    General: Pt is alert, awake, not in acute distress Cardiovascular: RRR, S1/S2 +, no rubs, no gallops Respiratory: CTA bilaterally, no wheezing, no rhonchi Abdominal: Soft, NT, ND, bowel sounds + Extremities: no edema, no cyanosis    The results of significant diagnostics from this hospitalization (including imaging, microbiology, ancillary and laboratory) are listed below for reference.     Microbiology: Recent Results (from the past 240 hour(s))  Resp Panel by RT-PCR (Flu A&B, Covid) Nasopharyngeal Swab     Status: Abnormal   Collection  Time: 05/06/21  6:19 PM   Specimen: Nasopharyngeal Swab; Nasopharyngeal(NP) swabs in vial transport medium  Result Value Ref Range Status   SARS Coronavirus 2 by RT PCR POSITIVE (A) NEGATIVE Final    Comment: RESULT CALLED TO, READ BACK BY AND VERIFIED WITH: [place] 05/06/21 A BROWNING (NOTE) SARS-CoV-2 target nucleic acids are DETECTED.  The SARS-CoV-2 RNA is generally detectable in upper respiratory specimens during the acute phase of infection. Positive results are indicative of the presence of the identified virus, but do not rule out bacterial infection or co-infection with other pathogens not detected by the test. Clinical correlation with patient history and other diagnostic information is necessary to determine patient infection status. The expected result is Negative.  Fact Sheet for Patients: EntrepreneurPulse.com.au  Fact Sheet for Healthcare Providers: IncredibleEmployment.be  This test is not yet approved or cleared by the [place] FDA and  has been authorized for detection and/or diagnosis of SARS-CoV-2 by FDA under an Emergency Use  Authorization (EUA).  This EUA will remain in effect (meaning this test  can be used) for the duration of  the COVID-19 declaration under Section 564(b)(1) of the Act, 21 U.S.C. section 360bbb-3(b)(1), unless the authorization is terminated or revoked sooner.     Influenza A by PCR NEGATIVE NEGATIVE Final   Influenza B by PCR NEGATIVE NEGATIVE Final    Comment: (NOTE) The Xpert Xpress SARS-CoV-2/FLU/RSV plus assay is intended as an aid in the diagnosis of influenza from Nasopharyngeal swab specimens and should not be used as a sole basis for treatment. Nasal washings and aspirates are unacceptable for Xpert Xpress SARS-CoV-2/FLU/RSV testing.  Fact Sheet for Patients: EntrepreneurPulse.com.au  Fact Sheet for Healthcare Providers: IncredibleEmployment.be  This test is not yet approved or cleared by the [place] FDA and has been authorized for detection and/or diagnosis of SARS-CoV-2 by FDA under an Emergency Use Authorization (EUA). This EUA will remain in effect (meaning this test can be used) for the duration of the COVID-19 declaration under Section 564(b)(1) of the Act, 21 U.S.C. section 360bbb-3(b)(1), unless the authorization is terminated or revoked.  Performed at [facility], [place] [address] [place], [place] [address]   Culture, blood (routine x 2)     Status: None (Preliminary result)   Collection Time: 05/06/21  9:08 PM   Specimen: BLOOD LEFT HAND  Result Value Ref Range Status   Specimen Description BLOOD LEFT HAND  Final   Special Requests   Final    BOTTLES DRAWN AEROBIC AND ANAEROBIC Blood Culture results may not be optimal due to an inadequate volume of blood received in culture bottles   Culture   Final    NO GROWTH < 24 HOURS Performed at [facility], [place] [address], [place], [place] [address]    Report Status PENDING  Incomplete     Labs: BNP (last 3 results) Recent Labs    04/13/21 2325  BNP  086.5*   Basic Metabolic Panel: Recent Labs  Lab 05/06/21 1448 05/07/21 0513  NA 141 138  K 4.0 4.9  CL 116* 113*  CO2 21* 20*  GLUCOSE 123* 75  BUN 28* 28*  CREATININE 1.73* 1.82*  CALCIUM 7.9* 9.0  MG  --  1.6*   Liver Function Tests: Recent Labs  Lab 05/06/21 1448 05/07/21 0513  AST 11* 12*  ALT 13 12  ALKPHOS 70 72  BILITOT 0.2* 0.6  PROT 4.7* 5.2*  ALBUMIN 2.6* 2.9*   No results  for input(s): LIPASE, AMYLASE in the last 168 hours. No results for input(s): AMMONIA in the last 168 hours. CBC: Recent Labs  Lab 05/06/21 1448 05/07/21 0513  WBC 6.9 7.4  NEUTROABS 3.8 4.0  HGB 9.9* 10.9*  HCT 32.3* 35.5*  MCV 99.7 97.3  PLT 185 209   Cardiac Enzymes: No results for input(s): CKTOTAL, CKMB, CKMBINDEX, TROPONINI in the last 168 hours. BNP: Invalid input(s): POCBNP CBG: Recent Labs  Lab 05/06/21 1358  GLUCAP 142*   D-Dimer Recent Labs    05/07/21 0513  DDIMER 4.75*   Hgb A1c No results for input(s): HGBA1C in the last 72 hours. Lipid Profile No results for input(s): CHOL, HDL, LDLCALC, TRIG, CHOLHDL, LDLDIRECT in the last 72 hours. Thyroid function studies Recent Labs    05/06/21 2133  TSH 1.416   Anemia work up Recent Labs    05/06/21 2133  VITAMINB12 564  FOLATE 7.4   Urinalysis    Component Value Date/Time   COLORURINE YELLOW 05/05/2019 [place] 05/05/2019 2254   LABSPEC 1.011 05/05/2019 2254   PHURINE 5.0 05/05/2019 [place] 05/05/2019 [place] 05/05/2019 [place] 05/05/2019 2254   BILIRUBINUR negative 09/07/2017 [place] 05/05/2019 [place] 05/05/2019 2254   UROBILINOGEN 0.2 09/07/2017 1645   UROBILINOGEN 4.0 (H) 08/04/2016 1517   NITRITE NEGATIVE 05/05/2019 2254   LEUKOCYTESUR MODERATE (A) 05/05/2019 2254   Sepsis Labs Invalid input(s): PROCALCITONIN,  WBC,  LACTICIDVEN Microbiology Recent Results (from the past 240  hour(s))  Resp Panel by RT-PCR (Flu A&B, Covid) Nasopharyngeal Swab     Status: Abnormal   Collection Time: 05/06/21  6:19 PM   Specimen: Nasopharyngeal Swab; Nasopharyngeal(NP) swabs in vial transport medium  Result Value Ref Range Status   SARS Coronavirus 2 by RT PCR POSITIVE (A) NEGATIVE Final    Comment: RESULT CALLED TO, READ BACK BY AND VERIFIED WITH: [place] 05/06/21 A BROWNING (NOTE) SARS-CoV-2 target nucleic acids are DETECTED.  The SARS-CoV-2 RNA is generally detectable in upper respiratory specimens during the acute phase of infection. Positive results are indicative of the presence of the identified virus, but do not rule out bacterial infection or co-infection with other pathogens not detected by the test. Clinical correlation with patient history and other diagnostic information is necessary to determine patient infection status. The expected result is Negative.  Fact Sheet for Patients: EntrepreneurPulse.com.au  Fact Sheet for Healthcare Providers: IncredibleEmployment.be  This test is not yet approved or cleared by the [place] FDA and  has been authorized for detection and/or diagnosis of SARS-CoV-2 by FDA under an Emergency Use Authorization (EUA).  This EUA will remain in effect (meaning this test  can be used) for the duration of  the COVID-19 declaration under Section 564(b)(1) of the Act, 21 U.S.C. section 360bbb-3(b)(1), unless the authorization is terminated or revoked sooner.     Influenza A by PCR NEGATIVE NEGATIVE Final   Influenza B by PCR NEGATIVE NEGATIVE Final    Comment: (NOTE) The Xpert Xpress SARS-CoV-2/FLU/RSV plus assay is intended as an aid in the diagnosis of influenza from Nasopharyngeal swab specimens and should not be used as a sole basis for treatment. Nasal washings and aspirates are unacceptable for Xpert Xpress SARS-CoV-2/FLU/RSV testing.  Fact Sheet for  Patients: EntrepreneurPulse.com.au  Fact Sheet for Healthcare Providers: IncredibleEmployment.be  This test is not yet approved or cleared by the [place] FDA  and has been authorized for detection and/or diagnosis of SARS-CoV-2 by FDA under an Emergency Use Authorization (EUA). This EUA will remain in effect (meaning this test can be used) for the duration of the COVID-19 declaration under Section 564(b)(1) of the Act, 21 U.S.C. section 360bbb-3(b)(1), unless the authorization is terminated or revoked.  Performed at [facility], [place] [address] [place], [place] [address]   Culture, blood (routine x 2)     Status: None (Preliminary result)   Collection Time: 05/06/21  9:08 PM   Specimen: BLOOD LEFT HAND  Result Value Ref Range Status   Specimen Description BLOOD LEFT HAND  Final   Special Requests   Final    BOTTLES DRAWN AEROBIC AND ANAEROBIC Blood Culture results may not be optimal due to an inadequate volume of blood received in culture bottles   Culture   Final    NO GROWTH < 24 HOURS Performed at [facility], [place] [address] [place], [place] [address]    Report Status PENDING  Incomplete    Please note: You were cared for by a hospitalist during your hospital stay. Once you are discharged, your primary care physician will handle any further medical issues. Please note that NO REFILLS for any discharge medications will be authorized once you are discharged, as it is imperative that you return to your primary care physician (or establish a relationship with a primary care physician if you do not have one) for your post hospital discharge needs so that they can reassess your need for medications and monitor your lab values.    Time coordinating discharge: 40 minutes  SIGNED:   Shelly Coss, MD  [facility] 05/07/2021, 1:30 PM Pager [phone]  If 7PM-7AM, please contact night-coverage [email or web address] Password  [email or web address]

## 2021-05-07 NOTE — ED Notes (Signed)
Pt would not let staff stick him for second blood cultures.

## 2021-05-11 LAB — CULTURE, BLOOD (ROUTINE X 2): Culture: NO GROWTH

## 2021-05-12 ENCOUNTER — Other Ambulatory Visit: Payer: Self-pay

## 2021-05-12 DIAGNOSIS — G1220 Motor neuron disease, unspecified: Secondary | ICD-10-CM | POA: Diagnosis not present

## 2021-05-17 ENCOUNTER — Telehealth: Payer: Self-pay | Admitting: Family Medicine

## 2021-05-17 NOTE — Telephone Encounter (Signed)
[employer] cabin crew with [facility] is calling to report the pt has covid and would like to delay start of care until 05-24-2021 for physical therapy

## 2021-05-17 NOTE — Telephone Encounter (Signed)
FYI

## 2021-05-17 NOTE — Telephone Encounter (Signed)
I spoke with the wife and she reports patient is doing well.  He had an ED visit on 05/06/2021 where this was addressed.  He follows up with his Pulmonologist soon.

## 2021-05-23 DIAGNOSIS — J432 Centrilobular emphysema: Secondary | ICD-10-CM | POA: Diagnosis not present

## 2021-05-23 DIAGNOSIS — J410 Simple chronic bronchitis: Secondary | ICD-10-CM | POA: Diagnosis not present

## 2021-05-23 DIAGNOSIS — Z720 Tobacco use: Secondary | ICD-10-CM | POA: Diagnosis not present

## 2021-05-23 DIAGNOSIS — R053 Chronic cough: Secondary | ICD-10-CM | POA: Diagnosis not present

## 2021-05-25 DIAGNOSIS — G1221 Amyotrophic lateral sclerosis: Secondary | ICD-10-CM | POA: Diagnosis not present

## 2021-05-25 NOTE — Telephone Encounter (Signed)
FYI

## 2021-05-25 NOTE — Telephone Encounter (Signed)
Pt is declining any PT services at this time [facility]

## 2021-05-26 DIAGNOSIS — N2889 Other specified disorders of kidney and ureter: Secondary | ICD-10-CM | POA: Diagnosis not present

## 2021-05-26 DIAGNOSIS — N183 Chronic kidney disease, stage 3 unspecified: Secondary | ICD-10-CM | POA: Diagnosis not present

## 2021-05-26 DIAGNOSIS — I129 Hypertensive chronic kidney disease with stage 1 through stage 4 chronic kidney disease, or unspecified chronic kidney disease: Secondary | ICD-10-CM | POA: Diagnosis not present

## 2021-05-27 DIAGNOSIS — E875 Hyperkalemia: Secondary | ICD-10-CM | POA: Diagnosis not present

## 2021-05-28 ENCOUNTER — Other Ambulatory Visit: Payer: Self-pay | Admitting: Family Medicine

## 2021-05-28 DIAGNOSIS — R197 Diarrhea, unspecified: Secondary | ICD-10-CM

## 2021-05-31 DIAGNOSIS — E875 Hyperkalemia: Secondary | ICD-10-CM | POA: Diagnosis not present

## 2021-06-03 ENCOUNTER — Other Ambulatory Visit: Payer: Self-pay | Admitting: Physician Assistant

## 2021-06-03 DIAGNOSIS — J3489 Other specified disorders of nose and nasal sinuses: Secondary | ICD-10-CM

## 2021-06-08 ENCOUNTER — Encounter: Payer: Self-pay | Admitting: Physician Assistant

## 2021-06-08 ENCOUNTER — Ambulatory Visit: Payer: Medicare HMO | Attending: Physician Assistant | Admitting: Physician Assistant

## 2021-06-08 ENCOUNTER — Other Ambulatory Visit: Payer: Self-pay

## 2021-06-08 VITALS — BP 138/67 | HR 66 | Ht 75.0 in

## 2021-06-08 DIAGNOSIS — I69354 Hemiplegia and hemiparesis following cerebral infarction affecting left non-dominant side: Secondary | ICD-10-CM | POA: Diagnosis not present

## 2021-06-08 DIAGNOSIS — D638 Anemia in other chronic diseases classified elsewhere: Secondary | ICD-10-CM | POA: Diagnosis not present

## 2021-06-08 DIAGNOSIS — J3489 Other specified disorders of nose and nasal sinuses: Secondary | ICD-10-CM

## 2021-06-08 DIAGNOSIS — I10 Essential (primary) hypertension: Secondary | ICD-10-CM | POA: Diagnosis not present

## 2021-06-08 DIAGNOSIS — N184 Chronic kidney disease, stage 4 (severe): Secondary | ICD-10-CM

## 2021-06-08 DIAGNOSIS — Z09 Encounter for follow-up examination after completed treatment for conditions other than malignant neoplasm: Secondary | ICD-10-CM | POA: Diagnosis not present

## 2021-06-08 MED ORDER — ATORVASTATIN CALCIUM 40 MG PO TABS: 40.0000 mg | ORAL_TABLET | Freq: Every day | ORAL | 1 refills | Status: DC

## 2021-06-08 MED ORDER — AMLODIPINE BESYLATE 5 MG PO TABS: 5.0000 mg | ORAL_TABLET | Freq: Every day | ORAL | 1 refills | Status: DC

## 2021-06-08 MED ORDER — FERROUS SULFATE 325 (65 FE) MG PO TABS: 325.0000 mg | ORAL_TABLET | Freq: Two times a day (BID) | ORAL | 2 refills | Status: DC

## 2021-06-08 MED ORDER — METOPROLOL TARTRATE 50 MG PO TABS: 50.0000 mg | ORAL_TABLET | Freq: Two times a day (BID) | ORAL | 3 refills | Status: DC

## 2021-06-08 MED ORDER — LIDOCAINE 5 % EX PTCH: MEDICATED_PATCH | CUTANEOUS | 0 refills | Status: DC

## 2021-06-08 MED ORDER — CETIRIZINE HCL 10 MG PO TABS: 10.0000 mg | ORAL_TABLET | Freq: Every day | ORAL | 0 refills | Status: DC

## 2021-06-08 MED ORDER — ALBUTEROL SULFATE HFA 108 (90 BASE) MCG/ACT IN AERS: INHALATION_SPRAY | RESPIRATORY_TRACT | 5 refills | Status: DC

## 2021-06-08 NOTE — Progress Notes (Signed)
Patient ID: Darrell [name], male   DOB: December 24, 1950, 70 y.o.   MRN: [id number]    Darrell [name], is a 70 y.o. male  YIF:[id number]  OMV:[id number]  DOB - 1951/03/11  Chief Complaint  Patient presents with   Diabetes       Subjective:   Darrell [name] is a 70 y.o. male here today for a follow up visit After hospitalization 8/12-8/13/2022 with altered mental status and tested + for Covid.  His wife is with him and says he is back to baseline.  Needs RF on meds.  Has GI appt Monday.    From discharge summary: Hospital Course:   His hospital course remained stable.  Patient was admitted for altered mental status,weakness and slurred speech.  During my evaluation today, he was completely alert and oriented and at baseline. Speech was fluent. Wife at the bedside.  Patient requesting repeatedly to be discharged to home. CT head on admission did not show any acute intracranial abnormalities. His COVID screen test was positive.  Chest x-ray showed possibility of a small patchy airspace disease at left lung base.  Patient is not hypoxic, maintaining 100% saturation on room air.   Lab work reviewed.  His vitamin G28, folic acid level, TSH are normal.  He has mildly elevated D-dimer level which could be from COVID illness.  His kidney function is at baseline.   blood work today showed magnesium of 1.6 for which he was given IV infusion of magnesium.  Patient was  not in any kind of distress.  Patient is paraplegic and is bedbound and is taken care of by his wife at home.Uses wheelchair for ambulation. Since he is not hypoxic and asymptomatic from [place] and since his mental status is at baseline, we discussed about discharge planning.  Wife is interested to take her home today and so is patient. Patient is azithromycin chronically at home, I recommend to follow-up with his PCP if that is indicated. Patient is medically stable for discharge home today.     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  No problems updated.  ALLERGIES: Allergies  Allergen Reactions   Ace Inhibitors Other (See Comments)    Hyperkalemia   Doxycycline Other (See Comments)    Hiccups, cough, nausea and emesis, elevated liver enzymes, elevated eosinophils, SOB concerning for early DRESS syndrome    Atacand Hct [Candesartan Cilexetil-Hctz] Hives   Shellfish Allergy Hives    PAST MEDICAL HISTORY: Past Medical History:  Diagnosis Date   Arthritis    At high risk for falls 08/16/2015   Bradycardia    Chronic kidney disease    stage 3 GFR 30-59 ml/min    CIDP (chronic inflammatory demyelinating polyneuropathy) ([name])    CKD (chronic kidney disease) stage 3, GFR 30-59 ml/min ([place]) 08/16/2015   Dysphagia as late effect of cerebrovascular disease    pts wife states pt has to eat soft foods    Elevated liver enzymes 08/10/2016   GERD (gastroesophageal reflux disease)    Glaucoma    High cholesterol    History  of CVA with residual deficit 03/25/2013   Hypertension    Hypertensive retinopathy of both eyes 01/16/2017   Inguinal hernia 03/25/2013   Liver hemangioma 08/14/2016   Neuromuscular disorder ([place])    chronic inflammatory demyelinating polyneuropathy    New onset seizure ([place]) 07/08/2017   seizure 07/14/18   Nuclear sclerosis of both eyes 10/25/2016   Presence of permanent cardiac pacemaker    placed in april 2018   Primary open angle glaucoma of both eyes, indeterminate stage 10/25/2016   Renal mass, right 08/14/2016   Status cardiac pacemaker 01/29/2017   Placed for second degree  heart block on 01/16/17 Medtronic Azure XT DR MRI SureScan dual-chamber pacemaker   Stroke [facility])    2011 with residual deficit left sided weakness   Tobacco dependence     MEDICATIONS AT HOME: Prior to Admission medications   Medication Sig Start Date End Date Taking? Authorizing Provider  albuterol (VENTOLIN HFA) 108 (90 Base) MCG/ACT inhaler Inhale 1-2 puffs into the lungs every 6 (six) hours as needed for wheezing or shortness of breath. 01/12/21  Yes Argentina Donovan, PA-C  aspirin EC 81 MG tablet Take 1 tablet (81 mg total) by mouth daily. 02/20/17  Yes Funches, Josalyn, MD  brimonidine (ALPHAGAN P) 0.1 % SOLN Place 1 drop into both eyes daily.    Yes [provider]  Budeson-Glycopyrrol-Formoterol (BREZTRI AEROSPHERE) 160-9-4.8 MCG/ACT AERO Inhale 2 puffs into the lungs 2 (two) times daily. 04/27/21  Yes [name], MD  cetirizine (ZYRTEC) 10 MG tablet Take 1 tablet by mouth once daily 06/03/21  Yes Laquesha Holcomb M, PA-C  dicyclomine (BENTYL) 10 MG capsule TAKE 1 CAPSULE BY MOUTH IN THE MORNING AND AT BEDTIME 05/31/21  Yes Newlin, Enobong, MD  dorzolamide-timolol (COSOPT) 22.3-6.8 MG/ML ophthalmic solution Place 1 drop into both eyes daily.    Yes [provider]  fluticasone (FLONASE) 50 MCG/ACT nasal spray Place 2 sprays into both nostrils daily. 01/12/21  Yes Argentina Donovan, PA-C  glycopyrrolate (ROBINUL) 1 MG tablet Take 1 tablet (1 mg total) by mouth 2 times daily. 05/04/21  Yes   guaiFENesin-dextromethorphan (ROBITUSSIN DM) 100-10 MG/5ML syrup Take 5 mLs by mouth every 4 (four) hours as needed for cough. 05/07/21  Yes Shelly Coss, MD  hydrOXYzine (ATARAX/VISTARIL) 25 MG tablet Take 1 tablet (25 mg total) by mouth at bedtime. 08/02/20  Yes Newlin, Enobong, MD  latanoprost (XALATAN) 0.005 % ophthalmic solution Place 1 drop into both eyes at bedtime.   Yes [provider]  levETIRAcetam (KEPPRA) 500 MG tablet Take 1 tablet (500 mg total) by mouth 2 (two)  times daily. 12/13/20  Yes Penumalli, Earlean Polka, MD  meloxicam (MOBIC) 15 MG tablet Take 1 tablet by mouth once daily 04/06/21  Yes Edrick Kins, DPM  Misc. Devices MISC Yankauer suction. Dx: ALS 11/11/19  Yes [name], MD  Misc. Devices MISC Coughalator 04/01/20  Yes [name], MD  Misc. Devices MISC Nebulizer machine.  Diagnosis- Motor neuron disease 04/01/20  Yes [name], MD  Misc. [place] [facility] Bed DXg12.20 06/01/20  Yes [name], MD  Misc. [place] [facility] bed extension. Diagnosis: ALS. Height 6'4" 06/10/20  Yes [name], MD  Misc. Devices MISC Manual wheelchair.  Diagnosis-ALS 05/04/21  Yes [name], MD  Multiple Vitamin (MULTI-VITAMIN) tablet Take by mouth.   Yes [provider]  Multiple Vitamins-Iron (DAILY VITAMIN FORMULA+IRON) TABS Take 1 tablet by mouth daily.   Yes [provider]  Multiple  Vitamins-Iron (MULTIVITAMINS WITH IRON) TABS tablet Take 1 tablet by mouth.   Yes [provider]  tamsulosin (FLOMAX) 0.4 MG CAPS capsule Take 1 capsule by mouth once daily 03/25/21  Yes Newlin, Enobong, MD  albuterol (VENTOLIN HFA) 108 (90 Base) MCG/ACT inhaler Inhale 2 puffs into the lungs every 6 (six) hours as needed for up to 30 days for Wheezing. 06/08/21   Argentina Donovan, PA-C  amLODipine (NORVASC) 5 MG tablet Take 1 tablet (5 mg total) by mouth daily. 06/08/21   Argentina Donovan, PA-C  atorvastatin (LIPITOR) 40 MG tablet Take 1 tablet (40 mg total) by mouth daily. 06/08/21   Argentina Donovan, PA-C  ferrous sulfate (FEROSUL) 325 (65 FE) MG tablet Take 1 tablet (325 mg total) by mouth 2 (two) times daily with a meal. 06/08/21   Babbette Dalesandro, Dionne Bucy, PA-C  lidocaine (LIDODERM) 5 % Apply patch to painful area. Patch may remain in place for up to 12 hours in a 24 hour period. 06/08/21   Argentina Donovan, PA-C  metoprolol tartrate (LOPRESSOR) 50 MG tablet Take 1 tablet (50 mg total) by mouth 2 (two) times daily.  06/08/21   Malayla Granberry, Dionne Bucy, PA-C    ROS: Neg HEENT Neg resp Neg cardiac Neg GI Neg GU Neg MS Neg psych Neg neuro  Objective:   Vitals:   06/08/21 1011  BP: 138/67  Pulse: 66  SpO2: 96%  Height: 6\' 3"  (1.905 m)   Exam General appearance : Awake, alert, not in any distress. Speech compromised due to stroke previously.  In wheel chair.  Appears to be in poor health but he is able to answer direct questions HEENT: Atraumatic and Normocephalic Chest: Good air entry bilaterally, CTAB.  No rales/rhonchi/wheezing CVS: S1 S2 regular, no murmurs.  Extremities: B/L Lower Ext shows no edema, both legs are warm to touch Neurology: Awake alert, and oriented X 3 Skin: No Rash  Data Review Lab Results  Component Value Date   HGBA1C 5.1 08/20/2019   HGBA1C 6.2 09/12/2018   HGBA1C 5.3 10/07/2013    Assessment & Plan   1. Anemia, chronic disease - CBC with Differential/Platelet - ferrous sulfate (FEROSUL) 325 (65 FE) MG tablet; Take 1 tablet (325 mg total) by mouth 2 (two) times daily with a meal.  Dispense: 60 tablet; Refill: 2  2. CKD (chronic kidney disease), stage IV ([place]) Just saw kidney specialist 1 week ago - ferrous sulfate (FEROSUL) 325 (65 FE) MG tablet; Take 1 tablet (325 mg total) by mouth 2 (two) times daily with a meal.  Dispense: 60 tablet; Refill: 2  3. Hypomagnesemia - Magnesium  4. Hospital discharge follow-up  5. Essential hypertension controlled - Comprehensive metabolic panel - amLODipine (NORVASC) 5 MG tablet; Take 1 tablet (5 mg total) by mouth daily.  Dispense: 90 tablet; Refill: 1 - metoprolol tartrate (LOPRESSOR) 50 MG tablet; Take 1 tablet (50 mg total) by mouth 2 (two) times daily.  Dispense: 180 tablet; Refill: 3  6. Hemiparesis affecting left side as late effect of stroke ([name]) - atorvastatin (LIPITOR) 40 MG tablet; Take 1 tablet (40 mg total) by mouth daily.  Dispense: 90 tablet; Refill: 1    Patient have been counseled extensively about  nutrition and exercise. Other issues discussed during this visit include: low cholesterol diet, weight control and daily exercise, foot care, annual eye examinations at Ophthalmology, importance of adherence with medications and regular follow-up. We also discussed long term complications of uncontrolled diabetes and hypertension.  Return in about 3 months (around 09/07/2021) for dr newlin-chronic conditions.  The patient was given clear instructions to go to ER or return to medical center if symptoms don't improve, worsen or new problems develop. The patient verbalized understanding. The patient was told to call to get lab results if they haven't heard anything in the next week.      Freeman Caldron, PA-C [facility] and [facility] [place], [place]   06/08/2021, 10:35 AM

## 2021-06-09 LAB — CBC WITH DIFFERENTIAL/PLATELET
Basophils Absolute: 0.1 10*3/uL (ref 0.0–0.2)
Basos: 1 %
EOS (ABSOLUTE): 0.6 10*3/uL — ABNORMAL HIGH (ref 0.0–0.4)
Eos: 9 %
Hematocrit: 35.2 % — ABNORMAL LOW (ref 37.5–51.0)
Hemoglobin: 11.8 g/dL — ABNORMAL LOW (ref 13.0–17.7)
Immature Grans (Abs): 0 10*3/uL (ref 0.0–0.1)
Immature Granulocytes: 0 %
Lymphocytes Absolute: 2.3 10*3/uL (ref 0.7–3.1)
Lymphs: 31 %
MCH: 30.3 pg (ref 26.6–33.0)
MCHC: 33.5 g/dL (ref 31.5–35.7)
MCV: 90 fL (ref 79–97)
Monocytes Absolute: 0.8 10*3/uL (ref 0.1–0.9)
Monocytes: 11 %
Neutrophils Absolute: 3.7 10*3/uL (ref 1.4–7.0)
Neutrophils: 48 %
Platelets: 204 10*3/uL (ref 150–450)
RBC: 3.9 x10E6/uL — ABNORMAL LOW (ref 4.14–5.80)
RDW: 12.1 % (ref 11.6–15.4)
WBC: 7.5 10*3/uL (ref 3.4–10.8)

## 2021-06-09 LAB — COMPREHENSIVE METABOLIC PANEL
ALT: 9 IU/L (ref 0–44)
AST: 8 IU/L (ref 0–40)
Albumin/Globulin Ratio: 1.7 (ref 1.2–2.2)
Albumin: 4.1 g/dL (ref 3.8–4.8)
Alkaline Phosphatase: 138 IU/L — ABNORMAL HIGH (ref 44–121)
BUN/Creatinine Ratio: 19 (ref 10–24)
BUN: 51 mg/dL — ABNORMAL HIGH (ref 8–27)
Bilirubin Total: 0.3 mg/dL (ref 0.0–1.2)
CO2: 24 mmol/L (ref 20–29)
Calcium: 10.7 mg/dL — ABNORMAL HIGH (ref 8.6–10.2)
Chloride: 102 mmol/L (ref 96–106)
Creatinine, Ser: 2.72 mg/dL — ABNORMAL HIGH (ref 0.76–1.27)
Globulin, Total: 2.4 g/dL (ref 1.5–4.5)
Glucose: 125 mg/dL — ABNORMAL HIGH (ref 65–99)
Potassium: 4.8 mmol/L (ref 3.5–5.2)
Sodium: 139 mmol/L (ref 134–144)
Total Protein: 6.5 g/dL (ref 6.0–8.5)
eGFR: 25 mL/min/{1.73_m2} — ABNORMAL LOW (ref >59)

## 2021-06-09 LAB — MAGNESIUM: Magnesium: 2 mg/dL (ref 1.6–2.3)

## 2021-06-12 DIAGNOSIS — G1220 Motor neuron disease, unspecified: Secondary | ICD-10-CM | POA: Diagnosis not present

## 2021-06-15 DIAGNOSIS — E875 Hyperkalemia: Secondary | ICD-10-CM | POA: Diagnosis not present

## 2021-06-16 ENCOUNTER — Telehealth: Payer: Self-pay

## 2021-06-16 NOTE — Telephone Encounter (Signed)
This CM spoke to [facility] who confirmed that the patient's manual wheelchair was delivered 05/12/2021

## 2021-06-20 DIAGNOSIS — D4102 Neoplasm of uncertain behavior of left kidney: Secondary | ICD-10-CM | POA: Diagnosis not present

## 2021-06-20 DIAGNOSIS — D4101 Neoplasm of uncertain behavior of right kidney: Secondary | ICD-10-CM | POA: Diagnosis not present

## 2021-06-21 ENCOUNTER — Ambulatory Visit (INDEPENDENT_AMBULATORY_CARE_PROVIDER_SITE_OTHER): Payer: Medicare HMO

## 2021-06-21 DIAGNOSIS — I441 Atrioventricular block, second degree: Secondary | ICD-10-CM

## 2021-06-23 LAB — CUP PACEART REMOTE DEVICE CHECK
Battery Remaining Longevity: 81 mo
Battery Voltage: 2.97 V
Brady Statistic AP VP Percent: 77.77 %
Brady Statistic AP VS Percent: 1.43 %
Brady Statistic AS VP Percent: 6.45 %
Brady Statistic AS VS Percent: 14.35 %
Brady Statistic RA Percent Paced: 91.23 %
Brady Statistic RV Percent Paced: 84.22 %
Date Time Interrogation Session: 20220929020548
Implantable Lead Implant Date: 20180424
Implantable Lead Implant Date: 20180424
Implantable Lead Location: 753859
Implantable Lead Location: 753860
Implantable Lead Model: 5076
Implantable Lead Model: 5076
Implantable Pulse Generator Implant Date: 20180424
Lead Channel Impedance Value: 285 Ohm
Lead Channel Impedance Value: 304 Ohm
Lead Channel Impedance Value: 380 Ohm
Lead Channel Impedance Value: 399 Ohm
Lead Channel Pacing Threshold Amplitude: 0.5 V
Lead Channel Pacing Threshold Amplitude: 0.625 V
Lead Channel Pacing Threshold Pulse Width: 0.4 ms
Lead Channel Pacing Threshold Pulse Width: 0.4 ms
Lead Channel Sensing Intrinsic Amplitude: 1.75 mV
Lead Channel Sensing Intrinsic Amplitude: 1.75 mV
Lead Channel Sensing Intrinsic Amplitude: 6.625 mV
Lead Channel Sensing Intrinsic Amplitude: 6.625 mV
Lead Channel Setting Pacing Amplitude: 1.5 V
Lead Channel Setting Pacing Amplitude: 2.5 V
Lead Channel Setting Pacing Pulse Width: 0.4 ms
Lead Channel Setting Sensing Sensitivity: 2 mV

## 2021-06-24 DIAGNOSIS — G1221 Amyotrophic lateral sclerosis: Secondary | ICD-10-CM | POA: Diagnosis not present

## 2021-06-28 NOTE — Progress Notes (Signed)
Remote pacemaker transmission.   

## 2021-06-29 ENCOUNTER — Ambulatory Visit: Payer: Medicare HMO | Admitting: Podiatry

## 2021-07-12 DIAGNOSIS — G1220 Motor neuron disease, unspecified: Secondary | ICD-10-CM | POA: Diagnosis not present

## 2021-07-13 ENCOUNTER — Ambulatory Visit: Payer: Medicare HMO | Admitting: Podiatry

## 2021-07-16 ENCOUNTER — Other Ambulatory Visit: Payer: Self-pay | Admitting: Physician Assistant

## 2021-07-16 ENCOUNTER — Other Ambulatory Visit: Payer: Self-pay | Admitting: Family Medicine

## 2021-07-16 DIAGNOSIS — J3489 Other specified disorders of nose and nasal sinuses: Secondary | ICD-10-CM

## 2021-07-16 DIAGNOSIS — R197 Diarrhea, unspecified: Secondary | ICD-10-CM

## 2021-07-17 NOTE — Telephone Encounter (Signed)
Requested Prescriptions  Pending Prescriptions Disp Refills  . dicyclomine (BENTYL) 10 MG capsule [Pharmacy Med Name: Dicyclomine HCl 10 MG Oral Capsule] 60 capsule 0    Sig: TAKE 1 CAPSULE BY MOUTH IN THE MORNING AND AT BEDTIME     Gastroenterology:  Antispasmodic Agents Passed - 07/16/2021 12:49 PM      Passed - Last Heart Rate in normal range    Pulse Readings from Last 1 Encounters:  06/08/21 66         Passed - Valid encounter within last 12 months    Recent Outpatient Visits          1 month ago Anemia, chronic disease   [place], [place]   2 months ago [place], Charlane Ferretti, MD   4 months ago Medicare annual wellness visit, subsequent   [place], Carroll Sage, FNP   6 months ago [place], [place], [place]   11 months ago Screening for colon cancer   [place], MD      Future Appointments            In 1 month [name], MD [place]

## 2021-07-17 NOTE — Telephone Encounter (Signed)
last RF 06/08/21 #90

## 2021-07-25 DIAGNOSIS — G1221 Amyotrophic lateral sclerosis: Secondary | ICD-10-CM | POA: Diagnosis not present

## 2021-07-25 DIAGNOSIS — H401134 Primary open-angle glaucoma, bilateral, indeterminate stage: Secondary | ICD-10-CM | POA: Diagnosis not present

## 2021-07-27 ENCOUNTER — Ambulatory Visit: Payer: Medicare HMO | Admitting: Podiatry

## 2021-07-27 DIAGNOSIS — M25572 Pain in left ankle and joints of left foot: Secondary | ICD-10-CM | POA: Diagnosis not present

## 2021-07-27 DIAGNOSIS — M25571 Pain in right ankle and joints of right foot: Secondary | ICD-10-CM | POA: Diagnosis not present

## 2021-07-27 DIAGNOSIS — R252 Cramp and spasm: Secondary | ICD-10-CM | POA: Diagnosis not present

## 2021-07-27 DIAGNOSIS — G1223 Primary lateral sclerosis: Secondary | ICD-10-CM | POA: Diagnosis not present

## 2021-07-27 DIAGNOSIS — R471 Dysarthria and anarthria: Secondary | ICD-10-CM | POA: Diagnosis not present

## 2021-07-27 DIAGNOSIS — Z8673 Personal history of transient ischemic attack (TIA), and cerebral infarction without residual deficits: Secondary | ICD-10-CM | POA: Diagnosis not present

## 2021-07-27 DIAGNOSIS — R1310 Dysphagia, unspecified: Secondary | ICD-10-CM | POA: Diagnosis not present

## 2021-07-27 DIAGNOSIS — Z79899 Other long term (current) drug therapy: Secondary | ICD-10-CM | POA: Diagnosis not present

## 2021-08-01 ENCOUNTER — Other Ambulatory Visit: Payer: Self-pay

## 2021-08-01 ENCOUNTER — Ambulatory Visit: Payer: Medicare HMO | Admitting: Family Medicine

## 2021-08-01 ENCOUNTER — Ambulatory Visit: Payer: Medicare HMO | Attending: Family Medicine | Admitting: Family Medicine

## 2021-08-01 DIAGNOSIS — Z1211 Encounter for screening for malignant neoplasm of colon: Secondary | ICD-10-CM

## 2021-08-01 DIAGNOSIS — J42 Unspecified chronic bronchitis: Secondary | ICD-10-CM

## 2021-08-01 DIAGNOSIS — R197 Diarrhea, unspecified: Secondary | ICD-10-CM

## 2021-08-01 DIAGNOSIS — G1223 Primary lateral sclerosis: Secondary | ICD-10-CM | POA: Diagnosis not present

## 2021-08-01 DIAGNOSIS — G1221 Amyotrophic lateral sclerosis: Secondary | ICD-10-CM

## 2021-08-01 DIAGNOSIS — R059 Cough, unspecified: Secondary | ICD-10-CM | POA: Diagnosis not present

## 2021-08-01 MED ORDER — BREZTRI AEROSPHERE 160-9-4.8 MCG/ACT IN AERO: 2.0000 | INHALATION_SPRAY | Freq: Two times a day (BID) | RESPIRATORY_TRACT | 6 refills | Status: DC

## 2021-08-01 MED ORDER — DICYCLOMINE HCL 10 MG PO CAPS: ORAL_CAPSULE | ORAL | 6 refills | Status: DC

## 2021-08-01 MED ORDER — GLYCOPYRROLATE 1 MG PO TABS: ORAL_TABLET | ORAL | 5 refills | Status: DC

## 2021-08-01 MED ORDER — TAMSULOSIN HCL 0.4 MG PO CAPS: 0.4000 mg | ORAL_CAPSULE | Freq: Every day | ORAL | 1 refills | Status: DC

## 2021-08-01 NOTE — Progress Notes (Signed)
Virtual Visit via Telephone Note  I connected with Darrell [name], on 08/01/2021 at 4:53 PM by telephone due to the COVID-19 pandemic and verified that I am speaking with the correct person using two identifiers.   Consent: I discussed the limitations, risks, security and privacy concerns of performing an evaluation and management service by telephone and the availability of in person appointments. I also discussed with the patient that there may be a patient responsible charge related to this service. The patient expressed understanding and agreed to proceed.   Location of Patient: Home  Location of Provider: Clinic   Persons participating in Telemedicine visit: Izzy [place]-CMA Dr. Margarita Rana     History of Present Illness: Darrell [name] is a 70 y.o. year old male with a history of hypertension, previous CVA with residual left-sided weakness, seizure in the setting of prior CVA, second degree AV block (status post medtronic dual chamber pacemaker) stage III CKD, motor neuron disease/ALS who is seen today for chronic disease management.    He still has a cough and his Spouse states he gets aggravated when she tries to suction him. Cough is worse at night.  I have reviewed his last clinic notes with [name] from 07/27/2021 where notes had revealed patient does have a CoughAssist device which he has not been using for his bowels.  His med list also reveals he should be on Robinul but [name] is not sure he has been taking it.  Endorses compliance with his inhalers.   He is scheduled for a CT chest which has been ordered by pulmonary and this comes up in 2 days. Pulmonary appointment comes up soon.  He is otherwise closely followed by his nephrologist who manages his CKD. He does have ongoing diarrhea which spouse describes as loose bowel and Bentyl appears on his med list however she is not sure if he has been taking it. Past Medical History:   Diagnosis Date   Arthritis    At high risk for falls 08/16/2015   Bradycardia    Chronic kidney disease    stage 3 GFR 30-59 ml/min    CIDP (chronic inflammatory demyelinating polyneuropathy) ([name])    CKD (chronic kidney disease) stage 3, GFR 30-59 ml/min ([name]) 08/16/2015   Dysphagia as late effect of cerebrovascular disease    pts wife states pt has to eat soft foods    Elevated liver enzymes 08/10/2016   GERD (gastroesophageal reflux disease)    Glaucoma    High cholesterol    History of CVA with residual deficit 03/25/2013   Hypertension    Hypertensive retinopathy of both eyes 01/16/2017   Inguinal hernia 03/25/2013   Liver hemangioma 08/14/2016   Neuromuscular disorder ([place])    chronic inflammatory demyelinating polyneuropathy    New onset seizure ([place]) 07/08/2017   seizure 07/14/18   Nuclear sclerosis of both eyes 10/25/2016   Presence of permanent cardiac pacemaker    placed in april 2018   Primary open angle glaucoma of both eyes, indeterminate stage 10/25/2016   Renal mass, right 08/14/2016   Status cardiac pacemaker 01/29/2017   Placed for second degree heart block on 01/16/17 Medtronic Azure XT DR MRI SureScan dual-chamber pacemaker   Stroke [facility])    2011 with residual deficit left sided weakness   Tobacco dependence    Allergies  Allergen Reactions   Ace Inhibitors Other (See Comments)    Hyperkalemia   Doxycycline Other (See Comments)  Hiccups, cough, nausea and emesis, elevated liver enzymes, elevated eosinophils, SOB concerning for early DRESS syndrome    Atacand Hct [Candesartan Cilexetil-Hctz] Hives   Shellfish Allergy Hives    Current Outpatient Medications on File Prior to Visit  Medication Sig Dispense Refill   albuterol (VENTOLIN HFA) 108 (90 Base) MCG/ACT inhaler Inhale 1-2 puffs into the lungs every 6 (six) hours as needed for wheezing or shortness of breath. 18 g 2   albuterol (VENTOLIN HFA) 108 (90 Base) MCG/ACT inhaler Inhale 2 puffs into the lungs  every 6 (six) hours as needed for up to 30 days for Wheezing. 18 g 5   amLODipine (NORVASC) 5 MG tablet Take 1 tablet (5 mg total) by mouth daily. 90 tablet 1   aspirin EC 81 MG tablet Take 1 tablet (81 mg total) by mouth daily. 90 tablet 3   atorvastatin (LIPITOR) 40 MG tablet Take 1 tablet (40 mg total) by mouth daily. 90 tablet 1   brimonidine (ALPHAGAN P) 0.1 % SOLN Place 1 drop into both eyes daily.      Budeson-Glycopyrrol-Formoterol (BREZTRI AEROSPHERE) 160-9-4.8 MCG/ACT AERO Inhale 2 puffs into the lungs 2 (two) times daily. 10.7 g 1   cetirizine (ZYRTEC) 10 MG tablet Take 1 tablet (10 mg total) by mouth daily. 90 tablet 0   dicyclomine (BENTYL) 10 MG capsule TAKE 1 CAPSULE BY MOUTH IN THE MORNING AND AT BEDTIME 60 capsule 0   dorzolamide-timolol (COSOPT) 22.3-6.8 MG/ML ophthalmic solution Place 1 drop into both eyes daily.      ferrous sulfate (FEROSUL) 325 (65 FE) MG tablet Take 1 tablet (325 mg total) by mouth 2 (two) times daily with a meal. 60 tablet 2   fluticasone (FLONASE) 50 MCG/ACT nasal spray Place 2 sprays into both nostrils daily. 16 g 3   glycopyrrolate (ROBINUL) 1 MG tablet Take 1 tablet (1 mg total) by mouth 2 times daily. 60 tablet 5   guaiFENesin-dextromethorphan (ROBITUSSIN DM) 100-10 MG/5ML syrup Take 5 mLs by mouth every 4 (four) hours as needed for cough. 236 mL 0   hydrOXYzine (ATARAX/VISTARIL) 25 MG tablet Take 1 tablet (25 mg total) by mouth at bedtime. 30 tablet 1   latanoprost (XALATAN) 0.005 % ophthalmic solution Place 1 drop into both eyes at bedtime.     levETIRAcetam (KEPPRA) 500 MG tablet Take 1 tablet (500 mg total) by mouth 2 (two) times daily. 180 tablet 4   lidocaine (LIDODERM) 5 % Apply patch to painful area. Patch may remain in place for up to 12 hours in a 24 hour period. 30 patch 0   meloxicam (MOBIC) 15 MG tablet Take 1 tablet by mouth once daily 30 tablet 0   metoprolol tartrate (LOPRESSOR) 50 MG tablet Take 1 tablet (50 mg total) by mouth 2 (two)  times daily. 180 tablet 3   Misc. Devices MISC Yankauer suction. Dx: ALS 1 each 0   Misc. Devices MISC Coughalator 1 each 0   Misc. Devices MISC Nebulizer machine.  Diagnosis- Motor neuron disease 1 each 0   Misc. Devices [place] [facility] Bed DXg12.20 1 each 0   Misc. [place] [facility] bed extension. Diagnosis: ALS. Height 6'4" 1 each 0   Misc. Devices MISC Manual wheelchair.  Diagnosis-ALS 1 each 0   Multiple Vitamin (MULTI-VITAMIN) tablet Take by mouth.     Multiple Vitamins-Iron (DAILY VITAMIN FORMULA+IRON) TABS Take 1 tablet by mouth daily.     Multiple Vitamins-Iron (MULTIVITAMINS WITH IRON) TABS tablet Take 1 tablet by  mouth.     tamsulosin (FLOMAX) 0.4 MG CAPS capsule Take 1 capsule by mouth once daily 90 capsule 0   No current facility-administered medications on file prior to visit.    ROS: See HPI  Observations/Objective: Awake, alert, ranted x3 Not in acute distress Normal mood  CMP Latest Ref Rng & Units 06/08/2021 05/07/2021 05/06/2021  Glucose 65 - 99 mg/dL 125(H) 75 123(H)  BUN 8 - 27 mg/dL 51(H) 28(H) 28(H)  Creatinine 0.76 - 1.27 mg/dL 2.72(H) 1.82(H) 1.73(H)  Sodium 134 - 144 mmol/L 139 138 141  Potassium 3.5 - 5.2 mmol/L 4.8 4.9 4.0  Chloride 96 - 106 mmol/L 102 113(H) 116(H)  CO2 20 - 29 mmol/L 24 20(L) 21(L)  Calcium 8.6 - 10.2 mg/dL 10.7(H) 9.0 7.9(L)  Total Protein 6.0 - 8.5 g/dL 6.5 5.2(L) 4.7(L)  Total Bilirubin 0.0 - 1.2 mg/dL 0.3 0.6 0.2(L)  Alkaline Phos 44 - 121 IU/L 138(H) 72 70  AST 0 - 40 IU/L 8 12(L) 11(L)  ALT 0 - 44 IU/L 9 12 13     Lipid Panel     Component Value Date/Time   CHOL 112 06/13/2018 0954   TRIG 51 06/13/2018 0954   HDL 40 06/13/2018 0954   CHOLHDL 2.8 06/13/2018 0954   CHOLHDL 4.1 08/16/2015 1555   VLDL 23 08/16/2015 1555   LDLCALC 62 06/13/2018 0954   LABVLDL 10 06/13/2018 0954    Lab Results  Component Value Date   HGBA1C 5.1 08/20/2019    Assessment and Plan: 1. 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[place]) Progressing He is continuing to experience an increased amount of secretions Refill glycopyrrolate which I am not sure he has been taking Continue to follow-up with the [facility] at [place] - glycopyrrolate (ROBINUL) 1 MG tablet; Take 1 tablet (1 mg total) by mouth 2 times daily.  Dispense: 60 tablet; Refill: 5  2. Cough in adult patient Secondary to #1 above Encouraged to allow his spouse to use the CoughAssist device and suction - glycopyrrolate (ROBINUL) 1 MG tablet; Take 1 tablet (1 mg total) by mouth 2 times daily.  Dispense: 60 tablet; Refill: 5 - Budeson-Glycopyrrol-Formoterol (BREZTRI AEROSPHERE) 160-9-4.8 MCG/ACT AERO; Inhale 2 puffs into the lungs 2 (two) times daily.  Dispense: 10.7 g; Refill: 6  3. Diarrhea, unspecified type Uncontrolled He has not been taking Bentyl which I have refilled - dicyclomine (BENTYL) 10 MG capsule; TAKE 1 CAPSULE BY MOUTH IN THE MORNING AND AT BEDTIME  Dispense: 60 capsule; Refill: 6  4. Screening for colon cancer - Cologuard  5. Chronic bronchitis, unspecified chronic bronchitis type ([place]) Continue with inhalers Keep upcoming appointment for CT chest Followed by pulmonary   Follow Up Instructions: 3 months   I discussed the assessment and treatment plan with the patient. The patient was provided an opportunity to ask questions and all were answered. The patient agreed with the plan and demonstrated an understanding of the instructions.   The patient was advised to call back or seek an in-person evaluation if the symptoms worsen or if the condition fails to improve as anticipated.     I provided 22 minutes total of non-face-to-face time during this encounter.   [name], MD, FAAFP. [facility] and [place] [place], [place]   08/01/2021, 4:53 PM

## 2021-08-02 ENCOUNTER — Encounter: Payer: Self-pay | Admitting: Family Medicine

## 2021-08-04 DIAGNOSIS — Z87891 Personal history of nicotine dependence: Secondary | ICD-10-CM | POA: Diagnosis not present

## 2021-08-04 DIAGNOSIS — J439 Emphysema, unspecified: Secondary | ICD-10-CM | POA: Diagnosis not present

## 2021-08-04 DIAGNOSIS — I700 Atherosclerosis of aorta: Secondary | ICD-10-CM | POA: Diagnosis not present

## 2021-08-08 ENCOUNTER — Ambulatory Visit: Payer: Medicare HMO | Admitting: Podiatry

## 2021-08-11 ENCOUNTER — Encounter: Payer: Self-pay | Admitting: Gastroenterology

## 2021-08-11 ENCOUNTER — Ambulatory Visit (INDEPENDENT_AMBULATORY_CARE_PROVIDER_SITE_OTHER): Payer: Medicare HMO | Admitting: Gastroenterology

## 2021-08-11 VITALS — BP 124/72 | HR 58 | Ht 75.5 in

## 2021-08-11 DIAGNOSIS — D649 Anemia, unspecified: Secondary | ICD-10-CM

## 2021-08-11 DIAGNOSIS — K529 Noninfective gastroenteritis and colitis, unspecified: Secondary | ICD-10-CM | POA: Diagnosis not present

## 2021-08-11 NOTE — Patient Instructions (Signed)
If you are age 69 or older, your body mass index should be between 23-30. Your Body mass index is 22.2 kg/m. If this is out of the aforementioned range listed, please consider follow up with your Primary Care Provider.  If you are age 44 or younger, your body mass index should be between 19-25. Your Body mass index is 22.2 kg/m. If this is out of the aformentioned range listed, please consider follow up with your Primary Care Provider.   ________________________________________________________  The [facility] providers would like to encourage you to use [facility] to communicate with providers for non-urgent requests or questions.  Due to long hold times on the telephone, sending your provider a message by [facility] may be a faster and more efficient way to get a response.  Please allow 48 business hours for a response.  Please remember that this is for non-urgent requests.  _______________________________________________________  It was a pleasure to see you today!  Thank you for trusting me with your gastrointestinal care!

## 2021-08-11 NOTE — Progress Notes (Signed)
[facility] Consult Note:  History: Darrell [name] 08/11/2021  Referring provider: [name], MD  Reason for consult/chief complaint: Melena (No bleeding per wife. Patient has a cologard kit at home from PCP.)   Subjective  HPI: Darrell [name] was seen by me once in consultation by telemedicine June 2020 after referral from primary care for constipation occurring in the setting of his primary lateral sclerosis diagnosis.  Patient's neurologic symptoms had progressed over the course of the previous year, he could ambulate very little and he had developed chronic constipation as well as oropharyngeal dysphagia.  I felt the risks of routine colonoscopy outweigh the expected benefits given the nature and severity of his neurologic condition and other medical issues, that was his last encounter with this office.  Harpreet's wife was with him for the entire visit and gives most of the history.  She believes the reason for consult was "dark stool, though she says that has been the case for a long time since he is on iron for his anemia.  He has had intermittent loose stool, that seems to stop since taking the dicyclomine, which she has been giving him twice a day.  He denies abdominal pain, it sounds his appetite is variable and he has difficulty swallowing, which limits his oral intake.  She does confirm (as noted below) interested in hospice evaluation, as patient's condition has steadily declined.  The patient's 07/27/2021 neurology office note clearly indicates progressive decline of his neurologic function. This patient's 08/01/2021 primary care note indicates ongoing trouble with respiratory secretions, diarrhea (with prescription of dicyclomine), and also it is noted that he Cologuard was ordered for colorectal cancer screening. Chart note from 08/01/2021 indicates this patient's wife wished to discuss hospice for him.  ROS:  Review of Systems He denies chest He has chronic  dyspnea Generalized muscle weakness Remainder of systems appear to be negative, though difficult to obtain due to the patient's debility and dysarthria.  Past Medical History: Past Medical History:  Diagnosis Date   Arthritis    At high risk for falls 08/16/2015   Bradycardia    Chronic kidney disease    stage 3 GFR 30-59 ml/min    CIDP (chronic inflammatory demyelinating polyneuropathy) ([name])    CKD (chronic kidney disease) stage 3, GFR 30-59 ml/min ([name]) 08/16/2015   Dysphagia as late effect of cerebrovascular disease    pts wife states pt has to eat soft foods    Elevated liver enzymes 08/10/2016   GERD (gastroesophageal reflux disease)    Glaucoma    High cholesterol    History of CVA with residual deficit 03/25/2013   Hypertension    Hypertensive retinopathy of both eyes 01/16/2017   Inguinal hernia 03/25/2013   Liver hemangioma 08/14/2016   Neuromuscular disorder ([place])    chronic inflammatory demyelinating polyneuropathy    New onset seizure ([place]) 07/08/2017   seizure 07/14/18   Nuclear sclerosis of both eyes 10/25/2016   Presence of permanent cardiac pacemaker    placed in april 2018   Primary open angle glaucoma of both eyes, indeterminate stage 10/25/2016   Renal mass, right 08/14/2016   Status cardiac pacemaker 01/29/2017   Placed for second degree heart block on 01/16/17 Medtronic Azure XT DR MRI SureScan dual-chamber pacemaker   Stroke [facility])    2011 with residual deficit left sided weakness   Tobacco dependence      Past Surgical History: Past Surgical History:  Procedure Laterality Date   EYE SURGERY  HERNIA REPAIR     INGUINAL HERNIA REPAIR Right 11/23/2014   Procedure: right inguinal hernia repair with mesh;  Surgeon: Armandina Gemma, MD;  Location: [facility];  Service: General;  Laterality: Right;   INSERTION OF MESH N/A 11/23/2014   Procedure: INSERTION OF MESH;  Surgeon: Armandina Gemma, MD;  Location: [facility];  Service: General;  Laterality: N/A;   MASS EXCISION Left  08/29/2017   Procedure: EXCISION OF LEFT NECK MASS;  Surgeon: Coralie Keens, MD;  Location: [facility];  Service: General;  Laterality: Left;   PACEMAKER IMPLANT N/A 01/16/2017   Procedure: Pacemaker Implant;  Surgeon: Will Meredith Leeds, MD;  Location: [facility];  Service: Cardiovascular;  Laterality: N/A;   SHOULDER SURGERY Bilateral 1988, 1998     Family History: Family History  Problem Relation Age of Onset   Hypertension Mother    Diabetes Sister    Hypertension Sister    Cancer Sister        1 sister   COPD Sister        in 1 sister   Stomach cancer Neg Hx    Colon cancer Neg Hx    Pancreatic cancer Neg Hx    Esophageal cancer Neg Hx     Social History: Social History   Socioeconomic History   Marital status: Married    Spouse name: [name]   Number of children: 1   Years of education: college   Highest education level: Not on file  Occupational History   Occupation: retired  Tobacco Use   Smoking status: Every Day    Packs/day: 1.00    Years: 40.00    Pack years: 40.00    Types: Cigarettes   Smokeless tobacco: Never   Tobacco comments:    12/13/20 5 a day  Vaping Use   Vaping Use: Never used  Substance and Sexual Activity   Alcohol use: No    Comment: alcohol free for 1 year was drinking 1/2 pint per day    Drug use: No   Sexual activity: Not on file  Other Topics Concern   Not on file  Social History Narrative   09/13/17 Patient lives at home with spouse.   Has 60 yo daughter   No grandchildren   Social Determinants of Radio broadcast assistant Strain: Not on file  Food Insecurity: Not on file  Transportation Needs: Not on file  Physical Activity: Not on file  Stress: Not on file  Social Connections: Not on file    Allergies: Allergies  Allergen Reactions   Ace Inhibitors Other (See Comments)    Hyperkalemia   Doxycycline Other (See Comments)    Hiccups, cough, nausea and emesis, elevated liver enzymes, elevated eosinophils,  SOB concerning for early DRESS syndrome    Atacand Hct [Candesartan Cilexetil-Hctz] Hives   Shellfish Allergy Hives    Outpatient Meds: Current Outpatient Medications  Medication Sig Dispense Refill   albuterol (VENTOLIN HFA) 108 (90 Base) MCG/ACT inhaler Inhale 1-2 puffs into the lungs every 6 (six) hours as needed for wheezing or shortness of breath. 18 g 2   albuterol (VENTOLIN HFA) 108 (90 Base) MCG/ACT inhaler Inhale 2 puffs into the lungs every 6 (six) hours as needed for up to 30 days for Wheezing. 18 g 5   amLODipine (NORVASC) 5 MG tablet Take 1 tablet (5 mg total) by mouth daily. 90 tablet 1   aspirin EC 81 MG tablet Take 1 tablet (81 mg total) by mouth daily. [place]  tablet 3   atorvastatin (LIPITOR) 40 MG tablet Take 1 tablet (40 mg total) by mouth daily. 90 tablet 1   brimonidine (ALPHAGAN P) 0.1 % SOLN Place 1 drop into both eyes daily.      Budeson-Glycopyrrol-Formoterol (BREZTRI AEROSPHERE) 160-9-4.8 MCG/ACT AERO Inhale 2 puffs into the lungs 2 (two) times daily. 10.7 g 6   cetirizine (ZYRTEC) 10 MG tablet Take 1 tablet (10 mg total) by mouth daily. 90 tablet 0   dicyclomine (BENTYL) 10 MG capsule TAKE 1 CAPSULE BY MOUTH IN THE MORNING AND AT BEDTIME 60 capsule 6   dorzolamide-timolol (COSOPT) 22.3-6.8 MG/ML ophthalmic solution Place 1 drop into both eyes daily.      ferrous sulfate (FEROSUL) 325 (65 FE) MG tablet Take 1 tablet (325 mg total) by mouth 2 (two) times daily with a meal. 60 tablet 2   fluticasone (FLONASE) 50 MCG/ACT nasal spray Place 2 sprays into both nostrils daily. 16 g 3   glycopyrrolate (ROBINUL) 1 MG tablet Take 1 tablet (1 mg total) by mouth 2 times daily. 60 tablet 5   guaiFENesin-dextromethorphan (ROBITUSSIN DM) 100-10 MG/5ML syrup Take 5 mLs by mouth every 4 (four) hours as needed for cough. 236 mL 0   hydrOXYzine (ATARAX/VISTARIL) 25 MG tablet Take 1 tablet (25 mg total) by mouth at bedtime. 30 tablet 1   latanoprost (XALATAN) 0.005 % ophthalmic solution  Place 1 drop into both eyes at bedtime.     levETIRAcetam (KEPPRA) 500 MG tablet Take 1 tablet (500 mg total) by mouth 2 (two) times daily. 180 tablet 4   lidocaine (LIDODERM) 5 % Apply patch to painful area. Patch may remain in place for up to 12 hours in a 24 hour period. 30 patch 0   meloxicam (MOBIC) 15 MG tablet Take 1 tablet by mouth once daily 30 tablet 0   metoprolol tartrate (LOPRESSOR) 50 MG tablet Take 1 tablet (50 mg total) by mouth 2 (two) times daily. 180 tablet 3   Misc. Devices MISC Yankauer suction. Dx: ALS 1 each 0   Misc. Devices MISC Coughalator 1 each 0   Misc. Devices MISC Nebulizer machine.  Diagnosis- Motor neuron disease 1 each 0   Misc. Devices [place] [facility] Bed DXg12.20 1 each 0   Misc. [place] [facility] bed extension. Diagnosis: ALS. Height 6'4" 1 each 0   Misc. Devices MISC Manual wheelchair.  Diagnosis-ALS 1 each 0   Multiple Vitamin (MULTI-VITAMIN) tablet Take by mouth.     Multiple Vitamins-Iron (DAILY VITAMIN FORMULA+IRON) TABS Take 1 tablet by mouth daily.     Multiple Vitamins-Iron (MULTIVITAMINS WITH IRON) TABS tablet Take 1 tablet by mouth.     tamsulosin (FLOMAX) 0.4 MG CAPS capsule Take 1 capsule (0.4 mg total) by mouth daily. 90 capsule 1   No current facility-administered medications for this visit.      ___________________________________________________________________ Objective   Exam:  BP 124/72   Pulse (!) 58   Ht 6' 3.5" (1.918 m)   BMI 22.20 kg/m  Wt Readings from Last 3 Encounters:  04/13/21 180 lb (81.6 kg)  03/02/21 166 lb (75.3 kg)  01/17/21 170 lb (77.1 kg)    General: Debilitated man with muscle wasting in wheelchair. Eyes: sclera anicteric, no redness ENT: oral mucosa dry, though he has a gurgling vocal quality CV: RRR without murmur, S1/S2, no JVD, no peripheral edema Resp: clear to auscultation bilaterally, normal RR and effort noted GI: soft,, no tenderness, with active bowel sounds.  Limited  exam. Skin; warm and dry, no rash or jaundice noted  Labs:  CBC Latest Ref Rng & Units 06/08/2021 05/07/2021 05/06/2021  WBC 3.4 - 10.8 x10E3/uL 7.5 7.4 6.9  Hemoglobin 13.0 - 17.7 g/dL 11.8(L) 10.9(L) 9.9(L)  Hematocrit 37.5 - 51.0 % 35.2(L) 35.5(L) 32.3(L)  Platelets 150 - 450 x10E3/uL 204 209 185   CMP Latest Ref Rng & Units 06/08/2021 05/07/2021 05/06/2021  Glucose 65 - 99 mg/dL 125(H) 75 123(H)  BUN 8 - 27 mg/dL 51(H) 28(H) 28(H)  Creatinine 0.76 - 1.27 mg/dL 2.72(H) 1.82(H) 1.73(H)  Sodium 134 - 144 mmol/L 139 138 141  Potassium 3.5 - 5.2 mmol/L 4.8 4.9 4.0  Chloride 96 - 106 mmol/L 102 113(H) 116(H)  CO2 20 - 29 mmol/L 24 20(L) 21(L)  Calcium 8.6 - 10.2 mg/dL 10.7(H) 9.0 7.9(L)  Total Protein 6.0 - 8.5 g/dL 6.5 5.2(L) 4.7(L)  Total Bilirubin 0.0 - 1.2 mg/dL 0.3 0.6 0.2(L)  Alkaline Phos 44 - 121 IU/L 138(H) 72 70  AST 0 - 40 IU/L 8 12(L) 11(L)  ALT 0 - 44 IU/L 9 12 13      Assessment: Encounter Diagnoses  Name Primary?   Chronic diarrhea Yes   Normocytic anemia     Loose stool of unclear cause.  He has been on iron for a long time which accounts for the dark color.  I wonder if he might actually have overflow incontinence since he has typically tended toward constipation.  His condition precluded getting him to an exam table for rectal exam today.  His wife says the diarrhea seems to have stopped in the dicyclomine, I cautioned her to decrease that to just once a day so he does not develop constipation.  Regarding recent plans for colorectal cancer screening, I recommend against doing that.  This patient has a severe and progressive neurologic condition with a limited life expectancy, which makes colon cancer screening not appropriate for him. Therefore, I recommended that they not perform the Cologuard test.  He is too high risk for anything other than emergent endoscopic procedures as well.  They understood the rationale for this advice and were appreciative of consideration  of his overall condition.  I will see him again as needed he is returned to primary care.  Thank you for the courtesy of this consult.  Please call me with any questions or concerns.  Nelida Meuse III  CC: Referring provider noted above

## 2021-08-12 DIAGNOSIS — G1220 Motor neuron disease, unspecified: Secondary | ICD-10-CM | POA: Diagnosis not present

## 2021-08-15 DIAGNOSIS — J432 Centrilobular emphysema: Secondary | ICD-10-CM | POA: Diagnosis not present

## 2021-08-15 DIAGNOSIS — J410 Simple chronic bronchitis: Secondary | ICD-10-CM | POA: Diagnosis not present

## 2021-08-24 DIAGNOSIS — G1221 Amyotrophic lateral sclerosis: Secondary | ICD-10-CM | POA: Diagnosis not present

## 2021-09-05 ENCOUNTER — Ambulatory Visit: Payer: Medicare HMO | Admitting: Podiatry

## 2021-09-05 ENCOUNTER — Other Ambulatory Visit: Payer: Self-pay

## 2021-09-05 ENCOUNTER — Encounter: Payer: Self-pay | Admitting: Podiatry

## 2021-09-05 DIAGNOSIS — M79674 Pain in right toe(s): Secondary | ICD-10-CM

## 2021-09-05 DIAGNOSIS — B351 Tinea unguium: Secondary | ICD-10-CM

## 2021-09-05 DIAGNOSIS — N183 Chronic kidney disease, stage 3 unspecified: Secondary | ICD-10-CM | POA: Diagnosis not present

## 2021-09-05 DIAGNOSIS — M79675 Pain in left toe(s): Secondary | ICD-10-CM | POA: Diagnosis not present

## 2021-09-05 NOTE — Progress Notes (Addendum)
This patient returns to my office for at risk foot care.  This patient requires this care by a professional since this patient will be at risk due to having CKD, history of CVA.  This patient is unable to cut nails himself since the patient cannot reach his nails.These nails are painful walking and wearing shoes.  This patient presents for at risk foot care today.  He presents with his wife and in a wheelchair.  General Appearance  Alert, conversant and in no acute stress.  Vascular  Dorsalis pedis and posterior tibial  pulses are palpable  bilaterally.  Capillary return is within normal limits  bilaterally. Temperature is within normal limits  bilaterally.  Neurologic  Senn-Weinstein monofilament wire test within normal limits  bilaterally. Muscle power within normal limits bilaterally.  Nails Thick disfigured discolored nails with subungual debris  from hallux to fifth toes bilaterally. No evidence of bacterial infection or drainage bilaterally.  Orthopedic  No limitations of motion  feet .  No crepitus or effusions noted.  No bony pathology or digital deformities noted. Inverted left foot.    Skin  normotropic skin with no porokeratosis noted bilaterally.  No signs of infections or ulcers noted.   Severely contracted toes  B/L. No drainage swelling or inflammation.  Onychomycosis  Pain in right toes  Pain in left toes  Healing Ulcer left ankle  Consent was obtained for treatment procedures.   Mechanical debridement of nails 1-5  bilaterally performed with a nail nipper.  Filed with dremel without incident. Heel cushion was dispensed for healing skin lesion right ankle.   Return office visit    3 months                 Told patient to return for periodic foot care and evaluation due to potential at risk complications.   Gardiner Barefoot DPM

## 2021-09-11 DIAGNOSIS — G1220 Motor neuron disease, unspecified: Secondary | ICD-10-CM | POA: Diagnosis not present

## 2021-09-14 ENCOUNTER — Encounter: Payer: Self-pay | Admitting: Family Medicine

## 2021-09-14 ENCOUNTER — Other Ambulatory Visit: Payer: Self-pay

## 2021-09-14 ENCOUNTER — Ambulatory Visit: Payer: Medicare HMO | Attending: Family Medicine | Admitting: Family Medicine

## 2021-09-14 DIAGNOSIS — N289 Disorder of kidney and ureter, unspecified: Secondary | ICD-10-CM

## 2021-09-14 DIAGNOSIS — N2889 Other specified disorders of kidney and ureter: Secondary | ICD-10-CM

## 2021-09-14 NOTE — Progress Notes (Signed)
Virtual Visit via Telephone Note  I connected with Darrell [name], on 09/14/2021 at 11:17 AM by telephone due to the COVID-19 pandemic and verified that I am speaking with the correct person using two identifiers.   Consent: I discussed the limitations, risks, security and privacy concerns of performing an evaluation and management service by telephone and the availability of in person appointments. I also discussed with the patient that there may be a patient responsible charge related to this service. The patient expressed understanding and agreed to proceed.   Location of Patient: Home  Location of Provider: Clinic   Persons participating in Telemedicine visit: Darrell [name] Dr. Margarita Rana Spouse    History of Present Illness: Darrell [name] is a 70 y.o. year old male with  a history of hypertension, previous CVA with residual left-sided weakness, seizure in the setting of prior CVA, second degree AV block (status post medtronic dual chamber pacemaker) stage III CKD, motor neuron disease/ALS who is seen today for chronic disease management.   At his visit with his pulmonologist last month he had a CT chest which revealed: IMPRESSION:  1. Lung-RADS 2, benign appearance or behavior. Continue annual  screening with low-dose chest CT without contrast in 12 months.  2. 3.2 cm cyst upper pole left kidney, largely stable since MRI  08/23/2016. Anterior exophytic cyst upper pole right kidney is  progressive since previous MRI and also since CT stone study of  05/05/2019. Given progression and non cystic appearance on previous  MRI, further imaging evaluation warranted. MRI abdomen with and  without contrast would be the study of choice if the patient's  pacemaker is MRI compatible. Otherwise consider CT abdomen without  and with contrast.  3.  Emphysema (ICD10-J43.9) and Aortic Atherosclerosis (ICD10-170.0)   He is on Azithromycin 3x/week, on his inhalers.  I had prescribed Robinul at  his last visit but he never picked this up from the pharmacy.  He is doing well per his wife and has no additional concerns today.  His chronic cough is persistent and he still is unable to cough up his mucus. History is mainly obtained from his spouse Past Medical History:  Diagnosis Date   Arthritis    At high risk for falls 08/16/2015   Bradycardia    Chronic kidney disease    stage 3 GFR 30-59 ml/min    CIDP (chronic inflammatory demyelinating polyneuropathy) ([name])    CKD (chronic kidney disease) stage 3, GFR 30-59 ml/min ([name]) 08/16/2015   Dysphagia as late effect of cerebrovascular disease    pts wife states pt has to eat soft foods    Elevated liver enzymes 08/10/2016   GERD (gastroesophageal reflux disease)    Glaucoma    High cholesterol    History of CVA with residual deficit 03/25/2013   Hypertension    Hypertensive retinopathy of both eyes 01/16/2017   Inguinal hernia 03/25/2013   Liver hemangioma 08/14/2016   Neuromuscular disorder ([place])    chronic inflammatory demyelinating polyneuropathy    New onset seizure ([place]) 07/08/2017   seizure 07/14/18   Nuclear sclerosis of both eyes 10/25/2016   Presence of permanent cardiac pacemaker    placed in april 2018   Primary open angle glaucoma of both eyes, indeterminate stage 10/25/2016   Renal mass, right 08/14/2016   Status cardiac pacemaker 01/29/2017   Placed for second degree heart block on 01/16/17 Medtronic Azure XT DR MRI SureScan dual-chamber pacemaker   Stroke [facility])    2011  with residual deficit left sided weakness   Tobacco dependence    Allergies  Allergen Reactions   Ace Inhibitors Other (See Comments)    Hyperkalemia   Doxycycline Other (See Comments)    Hiccups, cough, nausea and emesis, elevated liver enzymes, elevated eosinophils, SOB concerning for early DRESS syndrome    Atacand Hct [Candesartan Cilexetil-Hctz] Hives   Shellfish Allergy Hives    Current Outpatient Medications on File Prior to Visit   Medication Sig Dispense Refill   albuterol (VENTOLIN HFA) 108 (90 Base) MCG/ACT inhaler Inhale 1-2 puffs into the lungs every 6 (six) hours as needed for wheezing or shortness of breath. 18 g 2   albuterol (VENTOLIN HFA) 108 (90 Base) MCG/ACT inhaler Inhale 2 puffs into the lungs every 6 (six) hours as needed for up to 30 days for Wheezing. 18 g 5   amLODipine (NORVASC) 5 MG tablet Take 1 tablet (5 mg total) by mouth daily. 90 tablet 1   aspirin EC 81 MG tablet Take 1 tablet (81 mg total) by mouth daily. 90 tablet 3   atorvastatin (LIPITOR) 40 MG tablet Take 1 tablet (40 mg total) by mouth daily. 90 tablet 1   brimonidine (ALPHAGAN P) 0.1 % SOLN Place 1 drop into both eyes daily.      Budeson-Glycopyrrol-Formoterol (BREZTRI AEROSPHERE) 160-9-4.8 MCG/ACT AERO Inhale 2 puffs into the lungs 2 (two) times daily. 10.7 g 6   cetirizine (ZYRTEC) 10 MG tablet Take 1 tablet (10 mg total) by mouth daily. 90 tablet 0   dicyclomine (BENTYL) 10 MG capsule TAKE 1 CAPSULE BY MOUTH IN THE MORNING AND AT BEDTIME 60 capsule 6   dorzolamide-timolol (COSOPT) 22.3-6.8 MG/ML ophthalmic solution Place 1 drop into both eyes daily.      ferrous sulfate (FEROSUL) 325 (65 FE) MG tablet Take 1 tablet (325 mg total) by mouth 2 (two) times daily with a meal. 60 tablet 2   fluticasone (FLONASE) 50 MCG/ACT nasal spray Place 2 sprays into both nostrils daily. 16 g 3   glycopyrrolate (ROBINUL) 1 MG tablet Take 1 tablet (1 mg total) by mouth 2 times daily. 60 tablet 5   guaiFENesin-dextromethorphan (ROBITUSSIN DM) 100-10 MG/5ML syrup Take 5 mLs by mouth every 4 (four) hours as needed for cough. 236 mL 0   hydrOXYzine (ATARAX/VISTARIL) 25 MG tablet Take 1 tablet (25 mg total) by mouth at bedtime. 30 tablet 1   latanoprost (XALATAN) 0.005 % ophthalmic solution Place 1 drop into both eyes at bedtime.     levETIRAcetam (KEPPRA) 500 MG tablet Take 1 tablet (500 mg total) by mouth 2 (two) times daily. 180 tablet 4   lidocaine (LIDODERM)  5 % Apply patch to painful area. Patch may remain in place for up to 12 hours in a 24 hour period. 30 patch 0   meloxicam (MOBIC) 15 MG tablet Take 1 tablet by mouth once daily 30 tablet 0   metoprolol tartrate (LOPRESSOR) 50 MG tablet Take 1 tablet (50 mg total) by mouth 2 (two) times daily. 180 tablet 3   Misc. Devices MISC Yankauer suction. Dx: ALS 1 each 0   Misc. Devices MISC Coughalator 1 each 0   Misc. Devices MISC Nebulizer machine.  Diagnosis- Motor neuron disease 1 each 0   Misc. Devices [place] [facility] Bed DXg12.20 1 each 0   Misc. [place] [facility] bed extension. Diagnosis: ALS. Height 6'4" 1 each 0   Misc. Devices MISC Manual wheelchair.  Diagnosis-ALS 1 each 0   Multiple  Vitamin (MULTI-VITAMIN) tablet Take by mouth.     Multiple Vitamins-Iron (DAILY VITAMIN FORMULA+IRON) TABS Take 1 tablet by mouth daily.     Multiple Vitamins-Iron (MULTIVITAMINS WITH IRON) TABS tablet Take 1 tablet by mouth.     tamsulosin (FLOMAX) 0.4 MG CAPS capsule Take 1 capsule (0.4 mg total) by mouth daily. 90 capsule 1   No current facility-administered medications on file prior to visit.    ROS: See HPI  Observations/Objective: Awake, alert, oriented x3 Not in acute distress Normal mood  CMP Latest Ref Rng & Units 06/08/2021 05/07/2021 05/06/2021  Glucose 65 - 99 mg/dL 125(H) 75 123(H)  BUN 8 - 27 mg/dL 51(H) 28(H) 28(H)  Creatinine 0.76 - 1.27 mg/dL 2.72(H) 1.82(H) 1.73(H)  Sodium 134 - 144 mmol/L 139 138 141  Potassium 3.5 - 5.2 mmol/L 4.8 4.9 4.0  Chloride 96 - 106 mmol/L 102 113(H) 116(H)  CO2 20 - 29 mmol/L 24 20(L) 21(L)  Calcium 8.6 - 10.2 mg/dL 10.7(H) 9.0 7.9(L)  Total Protein 6.0 - 8.5 g/dL 6.5 5.2(L) 4.7(L)  Total Bilirubin 0.0 - 1.2 mg/dL 0.3 0.6 0.2(L)  Alkaline Phos 44 - 121 IU/L 138(H) 72 70  AST 0 - 40 IU/L 8 12(L) 11(L)  ALT 0 - 44 IU/L 9 12 13     Lipid Panel     Component Value Date/Time   CHOL 112 06/13/2018 0954   TRIG 51 06/13/2018 0954   HDL 40  06/13/2018 0954   CHOLHDL 2.8 06/13/2018 0954   CHOLHDL 4.1 08/16/2015 1555   VLDL 23 08/16/2015 1555   LDLCALC 62 06/13/2018 0954   LABVLDL 10 06/13/2018 0954    Lab Results  Component Value Date   HGBA1C 5.1 08/20/2019    Assessment and Plan: 1. Renal lesion Bilateral renal lesions seen on outside CT.  Left lesion is stable per notes but progression seen in right renal lesion  2. Other specified disorders of kidney and ureter Unable to order with contrast due to his CKD I spoke with the radiologist and the technician and obtained appropriate order that needs to be placed.  Per technician his pacemaker is MRI compatible. - MR ABDOMEN WO CONTRAST; Future   Follow Up Instructions: 3 months   I discussed the assessment and treatment plan with the patient. The patient was provided an opportunity to ask questions and all were answered. The patient agreed with the plan and demonstrated an understanding of the instructions.   The patient was advised to call back or seek an in-person evaluation if the symptoms worsen or if the condition fails to improve as anticipated.     I provided 11 minutes total of non-face-to-face time during this encounter.   [name], MD, FAAFP. [facility] and [place], [place]   09/14/2021, 11:17 AM

## 2021-09-20 ENCOUNTER — Ambulatory Visit (INDEPENDENT_AMBULATORY_CARE_PROVIDER_SITE_OTHER): Payer: Medicare HMO

## 2021-09-20 DIAGNOSIS — I441 Atrioventricular block, second degree: Secondary | ICD-10-CM

## 2021-09-22 LAB — CUP PACEART REMOTE DEVICE CHECK
Battery Remaining Longevity: 73 mo
Battery Voltage: 2.97 V
Brady Statistic AP VP Percent: 83.24 %
Brady Statistic AP VS Percent: 1.69 %
Brady Statistic AS VP Percent: 2.47 %
Brady Statistic AS VS Percent: 12.61 %
Brady Statistic RA Percent Paced: 96.24 %
Brady Statistic RV Percent Paced: 85.7 %
Date Time Interrogation Session: 20221229011240
Implantable Lead Implant Date: 20180424
Implantable Lead Implant Date: 20180424
Implantable Lead Location: 753859
Implantable Lead Location: 753860
Implantable Lead Model: 5076
Implantable Lead Model: 5076
Implantable Pulse Generator Implant Date: 20180424
Lead Channel Impedance Value: 285 Ohm
Lead Channel Impedance Value: 304 Ohm
Lead Channel Impedance Value: 380 Ohm
Lead Channel Impedance Value: 418 Ohm
Lead Channel Pacing Threshold Amplitude: 0.5 V
Lead Channel Pacing Threshold Amplitude: 0.625 V
Lead Channel Pacing Threshold Pulse Width: 0.4 ms
Lead Channel Pacing Threshold Pulse Width: 0.4 ms
Lead Channel Sensing Intrinsic Amplitude: 3.125 mV
Lead Channel Sensing Intrinsic Amplitude: 3.125 mV
Lead Channel Sensing Intrinsic Amplitude: 8.125 mV
Lead Channel Sensing Intrinsic Amplitude: 8.125 mV
Lead Channel Setting Pacing Amplitude: 1.5 V
Lead Channel Setting Pacing Amplitude: 2.5 V
Lead Channel Setting Pacing Pulse Width: 0.4 ms
Lead Channel Setting Sensing Sensitivity: 2 mV

## 2021-09-24 DIAGNOSIS — G1221 Amyotrophic lateral sclerosis: Secondary | ICD-10-CM | POA: Diagnosis not present

## 2021-09-27 ENCOUNTER — Telehealth: Payer: Self-pay | Admitting: Family Medicine

## 2021-09-27 ENCOUNTER — Ambulatory Visit (HOSPITAL_COMMUNITY): Payer: Medicare HMO

## 2021-09-27 NOTE — Telephone Encounter (Signed)
Copied from [place] [id number]. Topic: General - Other >> Sep 27, 2021  9:40 AM Rayann Heman wrote: Reason for CRM: Pt wife called and stated that she would like a call back from the office regarding referral for MRI and lesion that they found at cardiology appointment. Please advise

## 2021-09-27 NOTE — Telephone Encounter (Signed)
PA will be done and patient will be rescheduled and notified.

## 2021-09-28 NOTE — Progress Notes (Signed)
Remote pacemaker transmission.   

## 2021-10-07 ENCOUNTER — Emergency Department (HOSPITAL_COMMUNITY): Payer: Medicare HMO

## 2021-10-07 ENCOUNTER — Encounter (HOSPITAL_COMMUNITY): Payer: Self-pay

## 2021-10-07 ENCOUNTER — Inpatient Hospital Stay (HOSPITAL_COMMUNITY)
Admission: EM | Admit: 2021-10-07 | Discharge: 2021-10-11 | DRG: 871 | Disposition: A | Discharge status: Home Health | Payer: Medicare HMO | Attending: Internal Medicine | Admitting: Internal Medicine

## 2021-10-07 DIAGNOSIS — Z66 Do not resuscitate: Secondary | ICD-10-CM | POA: Diagnosis present

## 2021-10-07 DIAGNOSIS — R0602 Shortness of breath: Secondary | ICD-10-CM | POA: Diagnosis not present

## 2021-10-07 DIAGNOSIS — R627 Adult failure to thrive: Secondary | ICD-10-CM | POA: Diagnosis present

## 2021-10-07 DIAGNOSIS — K219 Gastro-esophageal reflux disease without esophagitis: Secondary | ICD-10-CM | POA: Diagnosis present

## 2021-10-07 DIAGNOSIS — Z20822 Contact with and (suspected) exposure to covid-19: Secondary | ICD-10-CM | POA: Diagnosis present

## 2021-10-07 DIAGNOSIS — R531 Weakness: Secondary | ICD-10-CM | POA: Diagnosis not present

## 2021-10-07 DIAGNOSIS — E782 Mixed hyperlipidemia: Secondary | ICD-10-CM | POA: Diagnosis present

## 2021-10-07 DIAGNOSIS — E669 Obesity, unspecified: Secondary | ICD-10-CM | POA: Diagnosis present

## 2021-10-07 DIAGNOSIS — N179 Acute kidney failure, unspecified: Secondary | ICD-10-CM | POA: Diagnosis present

## 2021-10-07 DIAGNOSIS — Z79899 Other long term (current) drug therapy: Secondary | ICD-10-CM

## 2021-10-07 DIAGNOSIS — Z7951 Long term (current) use of inhaled steroids: Secondary | ICD-10-CM

## 2021-10-07 DIAGNOSIS — Z993 Dependence on wheelchair: Secondary | ICD-10-CM

## 2021-10-07 DIAGNOSIS — J9601 Acute respiratory failure with hypoxia: Secondary | ICD-10-CM | POA: Diagnosis present

## 2021-10-07 DIAGNOSIS — J189 Pneumonia, unspecified organism: Secondary | ICD-10-CM

## 2021-10-07 DIAGNOSIS — G40201 Localization-related (focal) (partial) symptomatic epilepsy and epileptic syndromes with complex partial seizures, not intractable, with status epilepticus: Secondary | ICD-10-CM | POA: Diagnosis present

## 2021-10-07 DIAGNOSIS — K920 Hematemesis: Secondary | ICD-10-CM | POA: Diagnosis present

## 2021-10-07 DIAGNOSIS — Z7401 Bed confinement status: Secondary | ICD-10-CM

## 2021-10-07 DIAGNOSIS — G6181 Chronic inflammatory demyelinating polyneuritis: Secondary | ICD-10-CM | POA: Diagnosis present

## 2021-10-07 DIAGNOSIS — H543 Unqualified visual loss, both eyes: Secondary | ICD-10-CM | POA: Diagnosis present

## 2021-10-07 DIAGNOSIS — K5641 Fecal impaction: Secondary | ICD-10-CM | POA: Diagnosis present

## 2021-10-07 DIAGNOSIS — A419 Sepsis, unspecified organism: Principal | ICD-10-CM | POA: Diagnosis present

## 2021-10-07 DIAGNOSIS — Z809 Family history of malignant neoplasm, unspecified: Secondary | ICD-10-CM

## 2021-10-07 DIAGNOSIS — I69354 Hemiplegia and hemiparesis following cerebral infarction affecting left non-dominant side: Secondary | ICD-10-CM

## 2021-10-07 DIAGNOSIS — N184 Chronic kidney disease, stage 4 (severe): Secondary | ICD-10-CM | POA: Diagnosis present

## 2021-10-07 DIAGNOSIS — E872 Acidosis, unspecified: Secondary | ICD-10-CM | POA: Diagnosis present

## 2021-10-07 DIAGNOSIS — J690 Pneumonitis due to inhalation of food and vomit: Secondary | ICD-10-CM | POA: Diagnosis present

## 2021-10-07 DIAGNOSIS — G928 Other toxic encephalopathy: Secondary | ICD-10-CM | POA: Diagnosis present

## 2021-10-07 DIAGNOSIS — R0902 Hypoxemia: Secondary | ICD-10-CM

## 2021-10-07 DIAGNOSIS — F17210 Nicotine dependence, cigarettes, uncomplicated: Secondary | ICD-10-CM | POA: Diagnosis present

## 2021-10-07 DIAGNOSIS — H409 Unspecified glaucoma: Secondary | ICD-10-CM | POA: Diagnosis present

## 2021-10-07 DIAGNOSIS — R1110 Vomiting, unspecified: Secondary | ICD-10-CM | POA: Diagnosis not present

## 2021-10-07 DIAGNOSIS — K3189 Other diseases of stomach and duodenum: Secondary | ICD-10-CM | POA: Diagnosis not present

## 2021-10-07 DIAGNOSIS — N1832 Chronic kidney disease, stage 3b: Secondary | ICD-10-CM | POA: Diagnosis present

## 2021-10-07 DIAGNOSIS — I498 Other specified cardiac arrhythmias: Secondary | ICD-10-CM | POA: Diagnosis present

## 2021-10-07 DIAGNOSIS — I129 Hypertensive chronic kidney disease with stage 1 through stage 4 chronic kidney disease, or unspecified chronic kidney disease: Secondary | ICD-10-CM | POA: Diagnosis present

## 2021-10-07 DIAGNOSIS — R109 Unspecified abdominal pain: Secondary | ICD-10-CM | POA: Diagnosis not present

## 2021-10-07 DIAGNOSIS — R339 Retention of urine, unspecified: Secondary | ICD-10-CM | POA: Diagnosis present

## 2021-10-07 DIAGNOSIS — D62 Acute posthemorrhagic anemia: Secondary | ICD-10-CM | POA: Diagnosis present

## 2021-10-07 DIAGNOSIS — R6520 Severe sepsis without septic shock: Secondary | ICD-10-CM | POA: Diagnosis present

## 2021-10-07 DIAGNOSIS — E875 Hyperkalemia: Secondary | ICD-10-CM | POA: Diagnosis not present

## 2021-10-07 DIAGNOSIS — R918 Other nonspecific abnormal finding of lung field: Secondary | ICD-10-CM | POA: Diagnosis not present

## 2021-10-07 DIAGNOSIS — Z743 Need for continuous supervision: Secondary | ICD-10-CM | POA: Diagnosis not present

## 2021-10-07 DIAGNOSIS — Z4659 Encounter for fitting and adjustment of other gastrointestinal appliance and device: Secondary | ICD-10-CM

## 2021-10-07 DIAGNOSIS — I10 Essential (primary) hypertension: Secondary | ICD-10-CM | POA: Diagnosis not present

## 2021-10-07 DIAGNOSIS — R58 Hemorrhage, not elsewhere classified: Secondary | ICD-10-CM | POA: Diagnosis not present

## 2021-10-07 DIAGNOSIS — K5649 Other impaction of intestine: Secondary | ICD-10-CM | POA: Diagnosis present

## 2021-10-07 DIAGNOSIS — Z950 Presence of cardiac pacemaker: Secondary | ICD-10-CM

## 2021-10-07 DIAGNOSIS — R7303 Prediabetes: Secondary | ICD-10-CM | POA: Diagnosis present

## 2021-10-07 DIAGNOSIS — I700 Atherosclerosis of aorta: Secondary | ICD-10-CM | POA: Diagnosis not present

## 2021-10-07 DIAGNOSIS — Z8249 Family history of ischemic heart disease and other diseases of the circulatory system: Secondary | ICD-10-CM

## 2021-10-07 DIAGNOSIS — R29731 NIHSS score 31: Secondary | ICD-10-CM | POA: Diagnosis present

## 2021-10-07 DIAGNOSIS — Z91013 Allergy to seafood: Secondary | ICD-10-CM

## 2021-10-07 DIAGNOSIS — Z833 Family history of diabetes mellitus: Secondary | ICD-10-CM

## 2021-10-07 DIAGNOSIS — Z7982 Long term (current) use of aspirin: Secondary | ICD-10-CM

## 2021-10-07 DIAGNOSIS — F0150 Vascular dementia without behavioral disturbance: Secondary | ICD-10-CM | POA: Diagnosis present

## 2021-10-07 DIAGNOSIS — R1111 Vomiting without nausea: Secondary | ICD-10-CM | POA: Diagnosis not present

## 2021-10-07 DIAGNOSIS — D649 Anemia, unspecified: Secondary | ICD-10-CM | POA: Diagnosis present

## 2021-10-07 LAB — BASIC METABOLIC PANEL
Anion gap: 6 (ref 5–15)
BUN: 69 mg/dL — ABNORMAL HIGH (ref 8–23)
CO2: 25 mmol/L (ref 22–32)
Calcium: 9.6 mg/dL (ref 8.9–10.3)
Chloride: 107 mmol/L (ref 98–111)
Creatinine, Ser: 2.57 mg/dL — ABNORMAL HIGH (ref 0.61–1.24)
GFR, Estimated: 26 mL/min — ABNORMAL LOW (ref >60)
Glucose, Bld: 164 mg/dL — ABNORMAL HIGH (ref 70–99)
Potassium: 6.2 mmol/L — ABNORMAL HIGH (ref 3.5–5.1)
Sodium: 138 mmol/L (ref 135–145)

## 2021-10-07 LAB — CBC WITH DIFFERENTIAL/PLATELET
Abs Immature Granulocytes: 0.06 10*3/uL (ref 0.00–0.07)
Basophils Absolute: 0 10*3/uL (ref 0.0–0.1)
Basophils Relative: 0 %
Eosinophils Absolute: 0.1 10*3/uL (ref 0.0–0.5)
Eosinophils Relative: 1 %
HCT: 34.4 % — ABNORMAL LOW (ref 39.0–52.0)
Hemoglobin: 10.7 g/dL — ABNORMAL LOW (ref 13.0–17.0)
Immature Granulocytes: 0 %
Lymphocytes Relative: 9 %
Lymphs Abs: 1.3 10*3/uL (ref 0.7–4.0)
MCH: 29.6 pg (ref 26.0–34.0)
MCHC: 31.1 g/dL (ref 30.0–36.0)
MCV: 95.3 fL (ref 80.0–100.0)
Monocytes Absolute: 0.9 10*3/uL (ref 0.1–1.0)
Monocytes Relative: 6 %
Neutro Abs: 11.1 10*3/uL — ABNORMAL HIGH (ref 1.7–7.7)
Neutrophils Relative %: 84 %
Platelets: 217 10*3/uL (ref 150–400)
RBC: 3.61 MIL/uL — ABNORMAL LOW (ref 4.22–5.81)
RDW: 13.2 % (ref 11.5–15.5)
WBC: 13.4 10*3/uL — ABNORMAL HIGH (ref 4.0–10.5)
nRBC: 0 % (ref 0.0–0.2)

## 2021-10-07 LAB — HEPATIC FUNCTION PANEL
ALT: 14 U/L (ref 0–44)
AST: 14 U/L — ABNORMAL LOW (ref 15–41)
Albumin: 3.4 g/dL — ABNORMAL LOW (ref 3.5–5.0)
Alkaline Phosphatase: 94 U/L (ref 38–126)
Bilirubin, Direct: 0.1 mg/dL (ref 0.0–0.2)
Indirect Bilirubin: 0.5 mg/dL (ref 0.3–0.9)
Total Bilirubin: 0.6 mg/dL (ref 0.3–1.2)
Total Protein: 6.4 g/dL — ABNORMAL LOW (ref 6.5–8.1)

## 2021-10-07 LAB — POC OCCULT BLOOD, ED: Fecal Occult Bld: NEGATIVE

## 2021-10-07 LAB — TYPE AND SCREEN
ABO/RH(D): AB POS
Antibody Screen: NEGATIVE

## 2021-10-07 LAB — RESP PANEL BY RT-PCR (FLU A&B, COVID) ARPGX2
Influenza A by PCR: NEGATIVE
Influenza B by PCR: NEGATIVE
SARS Coronavirus 2 by RT PCR: NEGATIVE

## 2021-10-07 LAB — TROPONIN I (HIGH SENSITIVITY)
Troponin I (High Sensitivity): 8 ng/L (ref <18)
Troponin I (High Sensitivity): 9 ng/L (ref <18)

## 2021-10-07 LAB — LIPASE, BLOOD: Lipase: 55 U/L — ABNORMAL HIGH (ref 11–51)

## 2021-10-07 LAB — ABO/RH: ABO/RH(D): AB POS

## 2021-10-07 LAB — POTASSIUM: Potassium: 5.7 mmol/L — ABNORMAL HIGH (ref 3.5–5.1)

## 2021-10-07 MED ORDER — ACETAMINOPHEN 325 MG PO TABS
650.0000 mg | ORAL_TABLET | Freq: Four times a day (QID) | ORAL | Status: DC | PRN
  Administered 2021-10-08: 650 mg via ORAL
  Filled 2021-10-07: qty 2

## 2021-10-07 MED ORDER — AMIODARONE HCL IN DEXTROSE 360-4.14 MG/200ML-% IV SOLN
INTRAVENOUS | Status: AC
  Filled 2021-10-07: qty 200

## 2021-10-07 MED ORDER — METOCLOPRAMIDE HCL 5 MG/ML IJ SOLN
10.0000 mg | Freq: Once | INTRAMUSCULAR | Status: AC
  Administered 2021-10-07: 10 mg via INTRAVENOUS
  Filled 2021-10-07: qty 2

## 2021-10-07 MED ORDER — SODIUM CHLORIDE 0.9 % IV SOLN: INTRAVENOUS | Status: DC

## 2021-10-07 MED ORDER — MAGNESIUM SULFATE IN D5W 1-5 GM/100ML-% IV SOLN
1.0000 g | Freq: Once | INTRAVENOUS | Status: AC
  Administered 2021-10-07: 1 g via INTRAVENOUS
  Filled 2021-10-07: qty 100

## 2021-10-07 MED ORDER — BUDESON-GLYCOPYRROL-FORMOTEROL 160-9-4.8 MCG/ACT IN AERO: 2.0000 | INHALATION_SPRAY | Freq: Two times a day (BID) | RESPIRATORY_TRACT | Status: DC

## 2021-10-07 MED ORDER — UMECLIDINIUM BROMIDE 62.5 MCG/ACT IN AEPB
1.0000 | INHALATION_SPRAY | Freq: Every day | RESPIRATORY_TRACT | Status: DC
  Administered 2021-10-09 – 2021-10-10 (×2): 1 via RESPIRATORY_TRACT
  Filled 2021-10-07: qty 7

## 2021-10-07 MED ORDER — HYDROXYZINE HCL 25 MG PO TABS
25.0000 mg | ORAL_TABLET | Freq: Every day | ORAL | Status: DC
  Administered 2021-10-07: 25 mg via ORAL
  Filled 2021-10-07: qty 1

## 2021-10-07 MED ORDER — ONDANSETRON HCL 4 MG/2ML IJ SOLN
4.0000 mg | Freq: Once | INTRAMUSCULAR | Status: DC
  Filled 2021-10-07: qty 2

## 2021-10-07 MED ORDER — MOMETASONE FURO-FORMOTEROL FUM 100-5 MCG/ACT IN AERO
2.0000 | INHALATION_SPRAY | Freq: Two times a day (BID) | RESPIRATORY_TRACT | Status: DC
  Administered 2021-10-08 – 2021-10-10 (×6): 2 via RESPIRATORY_TRACT
  Filled 2021-10-07: qty 8.8

## 2021-10-07 MED ORDER — BISACODYL 10 MG RE SUPP
10.0000 mg | Freq: Once | RECTAL | Status: AC
Start: 2021-10-07 — End: 2021-10-07
  Administered 2021-10-07: 10 mg via RECTAL
  Filled 2021-10-07: qty 1

## 2021-10-07 MED ORDER — SODIUM CHLORIDE 0.9 % IV BOLUS
1000.0000 mL | Freq: Once | INTRAVENOUS | Status: AC
  Administered 2021-10-07: 1000 mL via INTRAVENOUS

## 2021-10-07 MED ORDER — TAMSULOSIN HCL 0.4 MG PO CAPS
0.4000 mg | ORAL_CAPSULE | Freq: Every day | ORAL | Status: DC
  Filled 2021-10-07: qty 1

## 2021-10-07 MED ORDER — METOPROLOL TARTRATE 50 MG PO TABS
50.0000 mg | ORAL_TABLET | Freq: Two times a day (BID) | ORAL | Status: DC
  Administered 2021-10-07: 50 mg via ORAL
  Filled 2021-10-07: qty 2

## 2021-10-07 MED ORDER — AMLODIPINE BESYLATE 5 MG PO TABS: 5.0000 mg | ORAL_TABLET | Freq: Every day | ORAL | Status: DC

## 2021-10-07 MED ORDER — LEVETIRACETAM 500 MG PO TABS
500.0000 mg | ORAL_TABLET | Freq: Two times a day (BID) | ORAL | Status: DC
  Administered 2021-10-07: 500 mg via ORAL
  Filled 2021-10-07: qty 1

## 2021-10-07 MED ORDER — ATORVASTATIN CALCIUM 40 MG PO TABS
40.0000 mg | ORAL_TABLET | Freq: Every day | ORAL | Status: DC
  Administered 2021-10-09: 40 mg via ORAL
  Filled 2021-10-07: qty 1

## 2021-10-07 MED ORDER — SODIUM CHLORIDE 0.9 % IV BOLUS: 1000.0000 mL | Freq: Once | INTRAVENOUS | Status: DC

## 2021-10-07 MED ORDER — BRIMONIDINE TARTRATE 0.15 % OP SOLN
1.0000 [drp] | Freq: Every day | OPHTHALMIC | Status: DC
  Administered 2021-10-09 – 2021-10-11 (×3): 1 [drp] via OPHTHALMIC
  Filled 2021-10-07: qty 5

## 2021-10-07 MED ORDER — MAGNESIUM SULFATE 2 GM/50ML IV SOLN: 2.0000 g | Freq: Once | INTRAVENOUS | Status: DC

## 2021-10-07 MED ORDER — LATANOPROST 0.005 % OP SOLN
1.0000 [drp] | Freq: Every day | OPHTHALMIC | Status: DC
  Administered 2021-10-08 – 2021-10-10 (×3): 1 [drp] via OPHTHALMIC
  Filled 2021-10-07: qty 2.5

## 2021-10-07 MED ORDER — SODIUM CHLORIDE 0.9 % IV SOLN
12.5000 mg | Freq: Once | INTRAVENOUS | Status: AC
  Administered 2021-10-07: 12.5 mg via INTRAVENOUS
  Filled 2021-10-07: qty 0.5

## 2021-10-07 MED ORDER — SORBITOL 70 % SOLN
400.0000 mL | TOPICAL_OIL | Freq: Once | ORAL | Status: AC
  Administered 2021-10-09: 400 mL via RECTAL
  Filled 2021-10-07 (×2): qty 120

## 2021-10-07 MED ORDER — METOPROLOL TARTRATE 5 MG/5ML IV SOLN
5.0000 mg | Freq: Once | INTRAVENOUS | Status: AC
  Administered 2021-10-07: 5 mg via INTRAVENOUS
  Filled 2021-10-07: qty 5

## 2021-10-07 MED ORDER — GLYCOPYRROLATE 1 MG PO TABS
1.0000 mg | ORAL_TABLET | Freq: Two times a day (BID) | ORAL | Status: DC
  Administered 2021-10-07 – 2021-10-09 (×2): 1 mg via ORAL
  Filled 2021-10-07 (×5): qty 1

## 2021-10-07 MED ORDER — ONDANSETRON HCL 4 MG PO TABS: 4.0000 mg | ORAL_TABLET | Freq: Four times a day (QID) | ORAL | Status: DC | PRN

## 2021-10-07 MED ORDER — SODIUM CHLORIDE 0.9 % IV SOLN
1.0000 g | Freq: Once | INTRAVENOUS | Status: AC
  Administered 2021-10-07: 1 g via INTRAVENOUS
  Filled 2021-10-07: qty 10

## 2021-10-07 MED ORDER — SODIUM CHLORIDE 0.9 % IV SOLN
500.0000 mg | INTRAVENOUS | Status: DC
  Administered 2021-10-07 – 2021-10-08 (×2): 500 mg via INTRAVENOUS
  Filled 2021-10-07 (×3): qty 5

## 2021-10-07 MED ORDER — SODIUM ZIRCONIUM CYCLOSILICATE 10 G PO PACK
10.0000 g | PACK | Freq: Once | ORAL | Status: AC
  Administered 2021-10-07: 10 g via ORAL
  Filled 2021-10-07: qty 1

## 2021-10-07 MED ORDER — ACETAMINOPHEN 650 MG RE SUPP: 650.0000 mg | Freq: Four times a day (QID) | RECTAL | Status: DC | PRN

## 2021-10-07 MED ORDER — ONDANSETRON HCL 4 MG/2ML IJ SOLN: 4.0000 mg | Freq: Four times a day (QID) | INTRAMUSCULAR | Status: DC | PRN

## 2021-10-07 MED ORDER — GUAIFENESIN-DM 100-10 MG/5ML PO SYRP: 5.0000 mL | ORAL_SOLUTION | ORAL | Status: DC | PRN

## 2021-10-07 MED ORDER — PANTOPRAZOLE SODIUM 40 MG IV SOLR
40.0000 mg | Freq: Two times a day (BID) | INTRAVENOUS | Status: DC
  Administered 2021-10-07 (×2): 40 mg via INTRAVENOUS
  Filled 2021-10-07 (×2): qty 40

## 2021-10-07 MED ORDER — DORZOLAMIDE HCL-TIMOLOL MAL 2-0.5 % OP SOLN
1.0000 [drp] | Freq: Every day | OPHTHALMIC | Status: DC
  Administered 2021-10-09 – 2021-10-11 (×3): 1 [drp] via OPHTHALMIC
  Filled 2021-10-07: qty 10

## 2021-10-07 NOTE — H&P (Signed)
History and Physical    Darrell [name] TOI:[id number] DOB: Feb 17, 1951 DOA: 10/07/2021  PCP: [name], MD  Patient coming from: Home.  I have personally briefly reviewed patient's old medical records in [place]  Chief Complaint: Coffee-ground emesis.  HPI: Darrell [name] is a 71 y.o. male with medical history significant of osteoarthritis, bradycardia, stage III CKD dysphagia, GERD, glaucoma, hyperlipidemia history of other nonhemorrhagic CVA, hypertension, inguinal hernia, liver hemangioma muscular disorder, nuclear sclerosis of both eyes.  Primary open-angle glaucoma, right renal mass tobacco dependence who is coming to the emergency department due to coffee-ground emesis since this morning.  No fever, chills, sore throat, rhinorrhea, no chest pain, dyspnea, palpitations, diaphoresis,.  He gets frequent lower extremity edema.  ED Course: Initial vital signs were temperature   Review of Systems: As per HPI otherwise all other systems reviewed and are negative.  Past Medical History:  Diagnosis Date   Arthritis    At high risk for falls 08/16/2015   Bradycardia    Chronic kidney disease    stage 3 GFR 30-59 ml/min    CIDP (chronic inflammatory demyelinating polyneuropathy) ([name])    CKD (chronic kidney disease) stage 3, GFR 30-59 ml/min ([name]) 08/16/2015   Dysphagia as late effect of cerebrovascular disease    pts wife states pt has to eat soft foods    Elevated liver enzymes 08/10/2016   GERD (gastroesophageal reflux disease)    Glaucoma    High cholesterol    History of CVA with residual deficit 03/25/2013   Hypertension    Hypertensive retinopathy of both eyes 01/16/2017   Inguinal hernia 03/25/2013   Liver hemangioma 08/14/2016   Neuromuscular disorder ([place])    chronic inflammatory demyelinating polyneuropathy    New onset seizure ([place]) 07/08/2017   seizure 07/14/18   Nuclear sclerosis of both eyes 10/25/2016   Presence of permanent cardiac pacemaker    placed in  april 2018   Primary open angle glaucoma of both eyes, indeterminate stage 10/25/2016   Renal mass, right 08/14/2016   Status cardiac pacemaker 01/29/2017   Placed for second degree heart block on 01/16/17 Medtronic Azure XT DR MRI SureScan dual-chamber pacemaker   Stroke [facility])    2011 with residual deficit left sided weakness   Tobacco dependence     Past Surgical History:  Procedure Laterality Date   EYE SURGERY     HERNIA REPAIR     INGUINAL HERNIA REPAIR Right 11/23/2014   Procedure: right inguinal hernia repair with mesh;  Surgeon: Armandina Gemma, MD;  Location: [facility];  Service: General;  Laterality: Right;   INSERTION OF MESH N/A 11/23/2014   Procedure: INSERTION OF MESH;  Surgeon: Armandina Gemma, MD;  Location: [facility];  Service: General;  Laterality: N/A;   MASS EXCISION Left 08/29/2017   Procedure: EXCISION OF LEFT NECK MASS;  Surgeon: Coralie Keens, MD;  Location: [place];  Service: General;  Laterality: Left;   PACEMAKER IMPLANT N/A 01/16/2017   Procedure: Pacemaker Implant;  Surgeon: Will Meredith Leeds, MD;  Location: [facility];  Service: Cardiovascular;  Laterality: N/A;   SHOULDER SURGERY Bilateral 1988, 1998    Social History  reports that he has been smoking cigarettes. He has a 40.00 pack-year smoking history. He has never used smokeless tobacco. He reports that he does not drink alcohol and does not use drugs.  Allergies  Allergen Reactions   Ace Inhibitors Other (See Comments)    Hyperkalemia   Doxycycline Other (See Comments)  Hiccups, cough, nausea and emesis, elevated liver enzymes, elevated eosinophils, SOB concerning for early DRESS syndrome    Atacand Hct [Candesartan Cilexetil-Hctz] Hives   Shellfish Allergy Hives    Family History  Problem Relation Age of Onset   Hypertension Mother    Diabetes Sister    Hypertension Sister    Cancer Sister        1 sister   COPD Sister        in 1 sister   Stomach cancer Neg Hx    Colon cancer Neg Hx     Pancreatic cancer Neg Hx    Esophageal cancer Neg Hx    Prior to Admission medications   Medication Sig Start Date End Date Taking? Authorizing Provider  albuterol (VENTOLIN HFA) 108 (90 Base) MCG/ACT inhaler Inhale 1-2 puffs into the lungs every 6 (six) hours as needed for wheezing or shortness of breath. 01/12/21   Argentina Donovan, PA-C  albuterol (VENTOLIN HFA) 108 (90 Base) MCG/ACT inhaler Inhale 2 puffs into the lungs every 6 (six) hours as needed for up to 30 days for Wheezing. 06/08/21   Argentina Donovan, PA-C  amLODipine (NORVASC) 5 MG tablet Take 1 tablet (5 mg total) by mouth daily. 06/08/21   Argentina Donovan, PA-C  aspirin EC 81 MG tablet Take 1 tablet (81 mg total) by mouth daily. 02/20/17   Funches, Adriana Mccallum, MD  atorvastatin (LIPITOR) 40 MG tablet Take 1 tablet (40 mg total) by mouth daily. 06/08/21   Argentina Donovan, PA-C  brimonidine (ALPHAGAN P) 0.1 % SOLN Place 1 drop into both eyes daily.     [provider]  Budeson-Glycopyrrol-Formoterol (BREZTRI AEROSPHERE) 160-9-4.8 MCG/ACT AERO Inhale 2 puffs into the lungs 2 (two) times daily. 08/01/21   [name], MD  cetirizine (ZYRTEC) 10 MG tablet Take 1 tablet (10 mg total) by mouth daily. 06/08/21   Argentina Donovan, PA-C  dicyclomine (BENTYL) 10 MG capsule TAKE 1 CAPSULE BY MOUTH IN THE MORNING AND AT BEDTIME 08/01/21   [name], MD  dorzolamide-timolol (COSOPT) 22.3-6.8 MG/ML ophthalmic solution Place 1 drop into both eyes daily.     [provider]  ferrous sulfate (FEROSUL) 325 (65 FE) MG tablet Take 1 tablet (325 mg total) by mouth 2 (two) times daily with a meal. 06/08/21   McClung, Dionne Bucy, PA-C  fluticasone (FLONASE) 50 MCG/ACT nasal spray Place 2 sprays into both nostrils daily. 01/12/21   Argentina Donovan, PA-C  glycopyrrolate (ROBINUL) 1 MG tablet Take 1 tablet (1 mg total) by mouth 2 times daily. 08/01/21   [name], MD  guaiFENesin-dextromethorphan (ROBITUSSIN DM) 100-10 MG/5ML syrup  Take 5 mLs by mouth every 4 (four) hours as needed for cough. 05/07/21   Shelly Coss, MD  hydrOXYzine (ATARAX/VISTARIL) 25 MG tablet Take 1 tablet (25 mg total) by mouth at bedtime. 08/02/20   [name], MD  latanoprost (XALATAN) 0.005 % ophthalmic solution Place 1 drop into both eyes at bedtime.    [provider]  levETIRAcetam (KEPPRA) 500 MG tablet Take 1 tablet (500 mg total) by mouth 2 (two) times daily. 12/13/20   Penumalli, Earlean Polka, MD  lidocaine (LIDODERM) 5 % Apply patch to painful area. Patch may remain in place for up to 12 hours in a 24 hour period. 06/08/21   Argentina Donovan, PA-C  meloxicam (MOBIC) 15 MG tablet Take 1 tablet by mouth once daily 04/06/21   Edrick Kins, DPM  metoprolol tartrate (LOPRESSOR) 50 MG  tablet Take 1 tablet (50 mg total) by mouth 2 (two) times daily. 06/08/21   Argentina Donovan, PA-C  Misc. Devices MISC Yankauer suction. Dx: ALS 11/11/19   [name], MD  Misc. Devices MISC Coughalator 04/01/20   [name], MD  Misc. Devices MISC Nebulizer machine.  Diagnosis- Motor neuron disease 04/01/20   [name], MD  Misc. [place] [facility] Bed BTD17.61 06/01/20   [name], MD  Misc. [place] [facility] bed extension. Diagnosis: ALS. Height 6'4" 06/10/20   [name], MD  Misc. Devices MISC Manual wheelchair.  Diagnosis-ALS 05/04/21   [name], MD  Multiple Vitamin (MULTI-VITAMIN) tablet Take by mouth.    [provider]  Multiple Vitamins-Iron (DAILY VITAMIN FORMULA+IRON) TABS Take 1 tablet by mouth daily.    [provider]  Multiple Vitamins-Iron (MULTIVITAMINS WITH IRON) TABS tablet Take 1 tablet by mouth.    [provider]  tamsulosin (FLOMAX) 0.4 MG CAPS capsule Take 1 capsule (0.4 mg total) by mouth daily. 08/01/21   [name], MD    Physical Exam: Vitals:   10/07/21 1230 10/07/21 1333 10/07/21 1430 10/07/21 1530  BP: (!) 137/92 (!) 147/89 (!) 146/88 (!) 148/94   Pulse: 92 87 95 (!) 102  Resp: 18 18 18 17   Temp:      TempSrc:      SpO2: 95% 95% 94% 95%   Constitutional: Chronically ill-appearing. NAD, calm, comfortable Eyes: PERRL, lids and conjunctivae normal ENMT: Mucous membranes are moist. Posterior pharynx clear of any exudate or lesions. Neck: normal, supple, no masses, no thyromegaly Respiratory: Decreased breath sounds in bases, otherwise clear to auscultation bilaterally, no wheezing, no crackles. Normal respiratory effort. No accessory muscle use.  Cardiovascular: Tachycardic in the 120s with frequent extrasystoles, no murmurs / rubs / gallops. No extremity edema. 2+ pedal pulses. No carotid bruits.  Abdomen: Mild distention.  Soft, no tenderness, no masses palpated. No hepatosplenomegaly. Bowel sounds positive.  Musculoskeletal: Moderate to severe generalized weakness.  No clubbing / cyanosis. Good ROM, no contractures. Normal muscle tone.  Skin: no acute rashes, lesions, ulcers on very limited dermatological examination. Neurologic: CN 2-12 grossly intact. Sensation intact, DTR normal. Strength 5/5 in all 4.  Psychiatric: Alert and oriented x 2, disoriented to time, date and situation.  Labs on Admission: I have personally reviewed following labs and imaging studies  CBC: Recent Labs  Lab 10/07/21 1124  WBC 13.4*  NEUTROABS 11.1*  HGB 10.7*  HCT 34.4*  MCV 95.3  PLT 607    Basic Metabolic Panel: Recent Labs  Lab 10/07/21 1124  NA 138  K 6.2*  CL 107  CO2 25  GLUCOSE 164*  BUN 69*  CREATININE 2.57*  CALCIUM 9.6    GFR: CrCl cannot be calculated (Unknown ideal weight.).  Liver Function Tests: Recent Labs  Lab 10/07/21 1124  AST 14*  ALT 14  ALKPHOS 94  BILITOT 0.6  PROT 6.4*  ALBUMIN 3.4*   Radiological Exams on Admission: CT ABDOMEN PELVIS WO CONTRAST  Result Date: 10/07/2021 CLINICAL DATA:  Hematemesis.  Denies abdominal pain. EXAM: CT ABDOMEN AND PELVIS WITHOUT CONTRAST TECHNIQUE: Multidetector CT  imaging of the abdomen and pelvis was performed following the standard protocol without IV contrast. COMPARISON:  CT abdomen and pelvis dated May 05, 2019. FINDINGS: Lower chest: No acute abnormality. Scarring and atelectasis in the lingula and left-greater-than-right lower lobes. Hepatobiliary: Unchanged 2.5 cm low-density lesion in the anterior left hepatic lobe, previously characterized as a hemangioma.  No new focal liver abnormality. The gallbladder is unremarkable. No biliary dilatation. Pancreas: Unremarkable. No pancreatic ductal dilatation or surrounding inflammatory changes. Spleen: Normal in size without focal abnormality. Adrenals/Urinary Tract: Adrenal glands are unremarkable. Enlarging now 2.6 cm indeterminate right renal anterior upper pole lesion, slowly increasing in size since 2017. Additional bilateral simple and complex renal cysts are not significantly changed. No renal calculi or hydronephrosis. Bladder is unremarkable. Stomach/Bowel: Distended distal esophagus and stomach. No bowel wall thickening, distention, or surrounding inflammatory changes. Large rectosigmoid stool ball. Normal appendix. Vascular/Lymphatic: Aortic atherosclerosis. No enlarged abdominal or pelvic lymph nodes. Reproductive: Prostate is unremarkable. Other: No free fluid or pneumoperitoneum. Musculoskeletal: No acute or significant osseous findings. IMPRESSION: 1. Distended distal esophagus and stomach without obstruction. 2. Large rectosigmoid stool ball. Correlate for fecal impaction. 3. Enlarging now 2.6 cm indeterminate right renal anterior upper pole lesion, slowly increasing in size since 2017. Renal cell carcinoma is not excluded. Recommend non-emergent outpatient renal protocol MRI with and without contrast for further evaluation. 4. Aortic Atherosclerosis (ICD10-I70.0). Electronically Signed   By: Titus Dubin M.D.   On: 10/07/2021 13:24   DG Abdomen 1 View  Result Date: 10/07/2021 CLINICAL DATA:   Coffee-ground emesis EXAM: ABDOMEN - 1 VIEW COMPARISON:  Portable exam 1205 hours compared to CT abdomen and pelvis 08/11/2016 FINDINGS: Increased stool in rectum. Gas and normal stool throughout remainder of colon. Gaseous distention of stomach. Small bowel gas pattern normal. No bowel dilatation or wall thickening otherwise identified. Bones demineralized. IMPRESSION: Increased stool in rectum Electronically Signed   By: Lavonia Dana M.D.   On: 10/07/2021 12:24   DG Chest Portable 1 View  Result Date: 10/07/2021 CLINICAL DATA:  Vomiting, coffee-ground emesis, increased weakness EXAM: PORTABLE CHEST 1 VIEW COMPARISON:  Portable exam 1203 hours compared to 05/23/2021 FINDINGS: LEFT subclavian sequential transvenous pacemaker leads project at RIGHT atrium and RIGHT ventricle. Normal heart size, mediastinal contours, and pulmonary vascularity. Atherosclerotic calcification aorta. Rotated to the LEFT. Mild bibasilar atelectasis versus infiltrate. Remaining lungs clear. No pleural effusion or pneumothorax. Bones demineralized. IMPRESSION: Bibasilar atelectasis versus infiltrate. Electronically Signed   By: Lavonia Dana M.D.   On: 10/07/2021 12:22    EKG: Independently reviewed.  Vent. rate 95 BPM PR interval 185 ms QRS duration 148 ms QT/QTcB 394/496 ms P-R-T axes 76 202 13 Paced rhythm, no sig change from prior tracing  Vent. rate 110 BPM PR interval 252 ms QRS duration 139 ms QT/QTcB 420/523 ms P-R-T axes 0 -75 103 Sinus tachycardia Supraventricular bigeminy Prolonged PR interval Left bundle branch block  Assessment/Plan Principal Problem:   Coffee ground emesis Observation/PCU. Clear liquid diet. Monitor hematocrit and hemoglobin. Continue pantoprazole 40 mg every 12 hours. Gastroenterology consult appreciated.  Active Problems:   Hyperkalemia Continue IV fluids. Lokelma given. Follow-up potassium level.    Bigeminal rhythm Initially thought to be a V. tach. Electrodes were  miscapturing pacemaker signal. Pacemaker interrogated no V. tach seen. Continue metoprolol 50 mg p.o. twice daily. Magnesium sulfate 1 g IVPB given..    Essential hypertension Continue metoprolol 50 mg p.o. twice daily Continue amlodipine 5 mg p.o. daily. Monitor blood pressure and heart rate.   Prediabetes CBG monitoring before meals and at bedtime.    Chronic kidney disease, stage 3b ([place]) Monitor renal function electrolytes.    Glaucoma Continue Alphagan and Cosopt drops.    GERD (gastroesophageal reflux disease) On IV PPI twice daily.    Mixed hyperlipidemia Continue atorvastatin.    Normocytic anemia Monitor H&H.  DVT prophylaxis: SCDs. Code Status:   Full code.  Family Communication:  His wife was at bedside.  Disposition Plan:   Patient is from:  Home.   Anticipated DC to:  Home.  Anticipated DC date:  10/10/2021.  Anticipated DC barriers: Clinical status.  Consults called:  [facility]. Admission status:  Observation/PCU.   Severity of Illness: High severity in the setting of hematemesis with abdominal pain, abdominal distention and constipation.  Reubin Milan MD [facility]  How to contact the [facility] Attending or Consulting provider [place] or covering provider during after hours [place], for this patient?   Check the care team in [facility] and look for a) attending/consulting [facility] provider listed and b) the [facility] team listed Log into [email or web address] and use [place]'s universal password to access. If you do not have the password, please contact the hospital operator. Locate the [facility] provider you are looking for under [facility] and page to a number that you can be directly reached. If you still have difficulty reaching the provider, please page the [facility] (Director on Call) for the Hospitalists listed on amion for assistance.  10/07/2021, 5:05 PM   This document was prepared in Dragon voice recognition software may contain some unintended  transcription errors.

## 2021-10-07 NOTE — ED Triage Notes (Signed)
Pt arrived via EMS, from home, bed bound, coffee ground emesis x1 hr. Denies any abd pain

## 2021-10-07 NOTE — ED Notes (Signed)
This Probation officer in to give enema, pt had large loose stool before admin. Enema held at this time.

## 2021-10-07 NOTE — Consult Note (Signed)
Consultation  Referring Provider:     ED/TRH Primary Care Physician:  [name], MD Primary Gastroenterologist:    Dr. Loletha Carrow     Reason for Consultation:     Dark emesis     Impression / Plan:   #1 probable fecal impaction  #2 coffee ground/dark emesis very much doubt significant GI bleed-hemoglobin around baseline at this time certainly may decrease some from hydration will monitor  #3 enlarging kidney lesion  #4 acute on chronic kidney injury with mild hyperkalemia  #5 chronic inflammatory demyelinating polyneuropathy plus history of stroke    He needs treatment of large stool ball in the rectal sigmoid area as seen on CT scan.  Will order enema therapy.  Observe otherwise.  Hydrate and supportive care per TRH.  IV twice daily PPI makes sense.  No role for endoscopic evaluation or treatment at this time.  I suspect he has had some coffee-ground emesis and not significant GI bleeding.  Defer to [facility] regarding diet I am okay with clear liquids.  We will follow-up.  Gatha Mayer, MD, [facility] 10/07/2021 4:39 PM          HPI:   Darrell [name] is a 71 y.o. male with a chronic inflammatory demyelinating polyneuropathy, history of stroke, hypertension, chronic kidney disease and a renal mass who presents with the onset of dark vomitus this morning.  History is taken from the patient and the chart that was limited from the history due to his mental status.  I called his wife but could not reach her.  He has not had vomiting since he was here.  CT scanning demonstrates a large rectosigmoid stool ball.  He has a history of chronic constipation and suspected fecal impaction and overflow incontinence and has seen my partner Dr. Loletha Carrow twice most recently in November 2022.  Comorbidities preclude screening for colon cancer and other invasive procedures.  He is not complaining of any pain.  Again ability to give history is limited.  He has had a little bit of a  cough and a working diagnosis of a community-acquired pneumonia was made and he is being treated with antibiotics for this.  Imaging does not show any convincing infiltrates consistent with pneumonia and I do not think there is been any fever.   CBC Latest Ref Rng & Units 10/07/2021 06/08/2021 05/07/2021  WBC 4.0 - 10.5 K/uL 13.4(H) 7.5 7.4  Hemoglobin 13.0 - 17.0 g/dL 10.7(L) 11.8(L) 10.9(L)  Hematocrit 39.0 - 52.0 % 34.4(L) 35.2(L) 35.5(L)  Platelets 150 - 400 K/uL 217 204 209     Past Medical History:  Diagnosis Date   Arthritis    At high risk for falls 08/16/2015   Bradycardia    Chronic kidney disease    stage 3 GFR 30-59 ml/min    CIDP (chronic inflammatory demyelinating polyneuropathy) ([name])    CKD (chronic kidney disease) stage 3, GFR 30-59 ml/min ([place]) 08/16/2015   Dysphagia as late effect of cerebrovascular disease    pts wife states pt has to eat soft foods    Elevated liver enzymes 08/10/2016   GERD (gastroesophageal reflux disease)    Glaucoma    High cholesterol    History of CVA with residual deficit 03/25/2013   Hypertension    Hypertensive retinopathy of both eyes 01/16/2017   Inguinal hernia 03/25/2013   Liver hemangioma 08/14/2016   Neuromuscular disorder ([place])    chronic inflammatory demyelinating polyneuropathy    New onset seizure (  [place]) 07/08/2017   seizure 07/14/18   Nuclear sclerosis of both eyes 10/25/2016   Presence of permanent cardiac pacemaker    placed in april 2018   Primary open angle glaucoma of both eyes, indeterminate stage 10/25/2016   Renal mass, right 08/14/2016   Status cardiac pacemaker 01/29/2017   Placed for second degree heart block on 01/16/17 Medtronic Azure XT DR MRI SureScan dual-chamber pacemaker   Stroke [facility])    2011 with residual deficit left sided weakness   Tobacco dependence     Past Surgical History:  Procedure Laterality Date   EYE SURGERY     HERNIA REPAIR     INGUINAL HERNIA REPAIR Right 11/23/2014   Procedure: right  inguinal hernia repair with mesh;  Surgeon: Armandina Gemma, MD;  Location: [facility];  Service: General;  Laterality: Right;   INSERTION OF MESH N/A 11/23/2014   Procedure: INSERTION OF MESH;  Surgeon: Armandina Gemma, MD;  Location: [facility];  Service: General;  Laterality: N/A;   MASS EXCISION Left 08/29/2017   Procedure: EXCISION OF LEFT NECK MASS;  Surgeon: Coralie Keens, MD;  Location: [place];  Service: General;  Laterality: Left;   PACEMAKER IMPLANT N/A 01/16/2017   Procedure: Pacemaker Implant;  Surgeon: Will Meredith Leeds, MD;  Location: [place] CV LAB;  Service: Cardiovascular;  Laterality: N/A;   SHOULDER SURGERY Bilateral 1988, 1998    Family History  Problem Relation Age of Onset   Hypertension Mother    Diabetes Sister    Hypertension Sister    Cancer Sister        1 sister   COPD Sister        in 1 sister   Stomach cancer Neg Hx    Colon cancer Neg Hx    Pancreatic cancer Neg Hx    Esophageal cancer Neg Hx      Social History   Tobacco Use   Smoking status: Every Day    Packs/day: 1.00    Years: 40.00    Pack years: 40.00    Types: Cigarettes   Smokeless tobacco: Never   Tobacco comments:    12/13/20 5 a day  Vaping Use   Vaping Use: Never used  Substance Use Topics   Alcohol use: No    Comment: alcohol free for 1 year was drinking 1/2 pint per day    Drug use: No    Prior to Admission medications   Medication Sig Start Date End Date Taking? Authorizing Provider  albuterol (VENTOLIN HFA) 108 (90 Base) MCG/ACT inhaler Inhale 1-2 puffs into the lungs every 6 (six) hours as needed for wheezing or shortness of breath. 01/12/21   Argentina Donovan, PA-C  albuterol (VENTOLIN HFA) 108 (90 Base) MCG/ACT inhaler Inhale 2 puffs into the lungs every 6 (six) hours as needed for up to 30 days for Wheezing. 06/08/21   Argentina Donovan, PA-C  amLODipine (NORVASC) 5 MG tablet Take 1 tablet (5 mg total) by mouth daily. 06/08/21   Argentina Donovan, PA-C  aspirin EC 81 MG tablet  Take 1 tablet (81 mg total) by mouth daily. 02/20/17   Funches, Adriana Mccallum, MD  atorvastatin (LIPITOR) 40 MG tablet Take 1 tablet (40 mg total) by mouth daily. 06/08/21   Argentina Donovan, PA-C  brimonidine (ALPHAGAN P) 0.1 % SOLN Place 1 drop into both eyes daily.     [provider]  Budeson-Glycopyrrol-Formoterol (BREZTRI AEROSPHERE) 160-9-4.8 MCG/ACT AERO Inhale 2 puffs into the lungs 2 (two) times daily.  08/01/21   [name], MD  cetirizine (ZYRTEC) 10 MG tablet Take 1 tablet (10 mg total) by mouth daily. 06/08/21   Argentina Donovan, PA-C  dicyclomine (BENTYL) 10 MG capsule TAKE 1 CAPSULE BY MOUTH IN THE MORNING AND AT BEDTIME 08/01/21   [name], MD  dorzolamide-timolol (COSOPT) 22.3-6.8 MG/ML ophthalmic solution Place 1 drop into both eyes daily.     [provider]  ferrous sulfate (FEROSUL) 325 (65 FE) MG tablet Take 1 tablet (325 mg total) by mouth 2 (two) times daily with a meal. 06/08/21   McClung, Dionne Bucy, PA-C  fluticasone (FLONASE) 50 MCG/ACT nasal spray Place 2 sprays into both nostrils daily. 01/12/21   Argentina Donovan, PA-C  glycopyrrolate (ROBINUL) 1 MG tablet Take 1 tablet (1 mg total) by mouth 2 times daily. 08/01/21   [name], MD  guaiFENesin-dextromethorphan (ROBITUSSIN DM) 100-10 MG/5ML syrup Take 5 mLs by mouth every 4 (four) hours as needed for cough. 05/07/21   Shelly Coss, MD  hydrOXYzine (ATARAX/VISTARIL) 25 MG tablet Take 1 tablet (25 mg total) by mouth at bedtime. 08/02/20   [name], MD  latanoprost (XALATAN) 0.005 % ophthalmic solution Place 1 drop into both eyes at bedtime.    [provider]  levETIRAcetam (KEPPRA) 500 MG tablet Take 1 tablet (500 mg total) by mouth 2 (two) times daily. 12/13/20   Penumalli, Earlean Polka, MD  lidocaine (LIDODERM) 5 % Apply patch to painful area. Patch may remain in place for up to 12 hours in a 24 hour period. 06/08/21   Argentina Donovan, PA-C  meloxicam (MOBIC) 15 MG tablet Take 1 tablet  by mouth once daily 04/06/21   Edrick Kins, DPM  metoprolol tartrate (LOPRESSOR) 50 MG tablet Take 1 tablet (50 mg total) by mouth 2 (two) times daily. 06/08/21   Argentina Donovan, PA-C  Misc. Devices MISC Yankauer suction. Dx: ALS 11/11/19   [name], MD  Misc. Devices MISC Coughalator 04/01/20   [name], MD  Misc. Devices MISC Nebulizer machine.  Diagnosis- Motor neuron disease 04/01/20   [name], MD  Misc. [facility] [facility] Bed ZOX09.60 06/01/20   [name], MD  Misc. [facility] bed extension. Diagnosis: ALS. Height 6'4" 06/10/20   [name], MD  Misc. Devices MISC Manual wheelchair.  Diagnosis-ALS 05/04/21   [name], MD  Multiple Vitamin (MULTI-VITAMIN) tablet Take by mouth.    [provider]  Multiple Vitamins-Iron (DAILY VITAMIN FORMULA+IRON) TABS Take 1 tablet by mouth daily.    [provider]  Multiple Vitamins-Iron (MULTIVITAMINS WITH IRON) TABS tablet Take 1 tablet by mouth.    [provider]  tamsulosin (FLOMAX) 0.4 MG CAPS capsule Take 1 capsule (0.4 mg total) by mouth daily. 08/01/21   [name], MD    Current Facility-Administered Medications  Medication Dose Route Frequency Provider Last Rate Last Admin   0.9 %  sodium chloride infusion   Intravenous Continuous Reubin Milan, MD       acetaminophen (TYLENOL) tablet 650 mg  650 mg Oral Q6H PRN Reubin Milan, MD       Or   acetaminophen (TYLENOL) suppository 650 mg  650 mg Rectal Q6H PRN Reubin Milan, MD       azithromycin [facility]) 500 mg in sodium chloride 0.9 % 250 mL IVPB  500 mg Intravenous Q24H Wyvonnia Dusky, MD 250 mL/hr at 10/07/21 1508 500 mg at 10/07/21 1508   ondansetron (ZOFRAN)  injection 4 mg  4 mg Intravenous Once Wyvonnia Dusky, MD       ondansetron [facility]) tablet 4 mg  4 mg Oral Q6H PRN Reubin Milan, MD       Or   ondansetron [facility]) injection 4 mg  4 mg Intravenous Q6H PRN Reubin Milan, MD       pantoprazole (PROTONIX) injection 40 mg  40 mg Intravenous Q12H Reubin Milan, MD       sodium zirconium cyclosilicate [facility]) packet 10 g  10 g Oral Once Reubin Milan, MD       Current Outpatient Medications  Medication Sig Dispense Refill   albuterol (VENTOLIN HFA) 108 (90 Base) MCG/ACT inhaler Inhale 1-2 puffs into the lungs every 6 (six) hours as needed for wheezing or shortness of breath. 18 g 2   albuterol (VENTOLIN HFA) 108 (90 Base) MCG/ACT inhaler Inhale 2 puffs into the lungs every 6 (six) hours as needed for up to 30 days for Wheezing. 18 g 5   amLODipine (NORVASC) 5 MG tablet Take 1 tablet (5 mg total) by mouth daily. 90 tablet 1   aspirin EC 81 MG tablet Take 1 tablet (81 mg total) by mouth daily. 90 tablet 3   atorvastatin (LIPITOR) 40 MG tablet Take 1 tablet (40 mg total) by mouth daily. 90 tablet 1   brimonidine (ALPHAGAN P) 0.1 % SOLN Place 1 drop into both eyes daily.      Budeson-Glycopyrrol-Formoterol (BREZTRI AEROSPHERE) 160-9-4.8 MCG/ACT AERO Inhale 2 puffs into the lungs 2 (two) times daily. 10.7 g 6   cetirizine (ZYRTEC) 10 MG tablet Take 1 tablet (10 mg total) by mouth daily. 90 tablet 0   dicyclomine (BENTYL) 10 MG capsule TAKE 1 CAPSULE BY MOUTH IN THE MORNING AND AT BEDTIME 60 capsule 6   dorzolamide-timolol (COSOPT) 22.3-6.8 MG/ML ophthalmic solution Place 1 drop into both eyes daily.      ferrous sulfate (FEROSUL) 325 (65 FE) MG tablet Take 1 tablet (325 mg total) by mouth 2 (two) times daily with a meal. 60 tablet 2   fluticasone (FLONASE) 50 MCG/ACT nasal spray Place 2 sprays into both nostrils daily. 16 g 3   glycopyrrolate (ROBINUL) 1 MG tablet Take 1 tablet (1 mg total) by mouth 2 times daily. 60 tablet 5   guaiFENesin-dextromethorphan (ROBITUSSIN DM) 100-10 MG/5ML syrup Take 5 mLs by mouth every 4 (four) hours as needed for cough. 236 mL 0   hydrOXYzine (ATARAX/VISTARIL) 25 MG tablet Take 1 tablet (25 mg total) by mouth at  bedtime. 30 tablet 1   latanoprost (XALATAN) 0.005 % ophthalmic solution Place 1 drop into both eyes at bedtime.     levETIRAcetam (KEPPRA) 500 MG tablet Take 1 tablet (500 mg total) by mouth 2 (two) times daily. 180 tablet 4   lidocaine (LIDODERM) 5 % Apply patch to painful area. Patch may remain in place for up to 12 hours in a 24 hour period. 30 patch 0   meloxicam (MOBIC) 15 MG tablet Take 1 tablet by mouth once daily 30 tablet 0   metoprolol tartrate (LOPRESSOR) 50 MG tablet Take 1 tablet (50 mg total) by mouth 2 (two) times daily. 180 tablet 3   Misc. Devices MISC Yankauer suction. Dx: ALS 1 each 0   Misc. Devices MISC Coughalator 1 each 0   Misc. Devices MISC Nebulizer machine.  Diagnosis- Motor neuron disease 1 each 0   Misc. Devices [facility] Bed DXg12.20 1 each  0   Misc. [facility] bed extension. Diagnosis: ALS. Height 6'4" 1 each 0   Misc. Devices MISC Manual wheelchair.  Diagnosis-ALS 1 each 0   Multiple Vitamin (MULTI-VITAMIN) tablet Take by mouth.     Multiple Vitamins-Iron (DAILY VITAMIN FORMULA+IRON) TABS Take 1 tablet by mouth daily.     Multiple Vitamins-Iron (MULTIVITAMINS WITH IRON) TABS tablet Take 1 tablet by mouth.     tamsulosin (FLOMAX) 0.4 MG CAPS capsule Take 1 capsule (0.4 mg total) by mouth daily. 90 capsule 1    Allergies as of 10/07/2021 - Review Complete 10/07/2021  Allergen Reaction Noted   Ace inhibitors Other (See Comments) 11/26/2015   Doxycycline Other (See Comments) 08/14/2016   Atacand hct [candesartan cilexetil-hctz] Hives 02/25/2012   Shellfish allergy Hives 02/25/2012     Review of Systems:  . Unable to obtain a complete review of systems due to mental status      Physical Exam:  Vital signs in last 24 hours: Temp:  [97.9 F (36.6 C)] 97.9 F (36.6 C) (01/13 1116) Pulse Rate:  [87-102] 102 (01/13 1530) Resp:  [17-23] 17 (01/13 1530) BP: (137-148)/(88-95) 148/94 (01/13 1530) SpO2:  [94 %-96 %] 95 % (01/13  1530)    General:  Chronically ill black man lying in bed Eyes:  anicteric. Lungs: Clear to auscultation bilaterally.-Anterior Heart:   S1S2, with frequent premature beats Abdomen: Mildly protuberant, soft, non-tender, no hepatosplenomegaly, hernia, or mass and BS+.  Neuro:  The patient knows his name but thought he was at home    Data Reviewed:   LAB RESULTS: Recent Labs    10/07/21 1124  WBC 13.4*  HGB 10.7*  HCT 34.4*  PLT 217   BMET Recent Labs    10/07/21 1124  NA 138  K 6.2*  CL 107  CO2 25  GLUCOSE 164*  BUN 69*  CREATININE 2.57*  CALCIUM 9.6   LFT Recent Labs    10/07/21 1124  PROT 6.4*  ALBUMIN 3.4*  AST 14*  ALT 14  ALKPHOS 94  BILITOT 0.6  BILIDIR 0.1  IBILI 0.5     STUDIES: CT ABDOMEN PELVIS WO CONTRAST  Result Date: 10/07/2021 CLINICAL DATA:  Hematemesis.  Denies abdominal pain. EXAM: CT ABDOMEN AND PELVIS WITHOUT CONTRAST TECHNIQUE: Multidetector CT imaging of the abdomen and pelvis was performed following the standard protocol without IV contrast. COMPARISON:  CT abdomen and pelvis dated May 05, 2019. FINDINGS: Lower chest: No acute abnormality. Scarring and atelectasis in the lingula and left-greater-than-right lower lobes. Hepatobiliary: Unchanged 2.5 cm low-density lesion in the anterior left hepatic lobe, previously characterized as a hemangioma. No new focal liver abnormality. The gallbladder is unremarkable. No biliary dilatation. Pancreas: Unremarkable. No pancreatic ductal dilatation or surrounding inflammatory changes. Spleen: Normal in size without focal abnormality. Adrenals/Urinary Tract: Adrenal glands are unremarkable. Enlarging now 2.6 cm indeterminate right renal anterior upper pole lesion, slowly increasing in size since 2017. Additional bilateral simple and complex renal cysts are not significantly changed. No renal calculi or hydronephrosis. Bladder is unremarkable. Stomach/Bowel: Distended distal esophagus and stomach. No  bowel wall thickening, distention, or surrounding inflammatory changes. Large rectosigmoid stool ball. Normal appendix. Vascular/Lymphatic: Aortic atherosclerosis. No enlarged abdominal or pelvic lymph nodes. Reproductive: Prostate is unremarkable. Other: No free fluid or pneumoperitoneum. Musculoskeletal: No acute or significant osseous findings. IMPRESSION: 1. Distended distal esophagus and stomach without obstruction. 2. Large rectosigmoid stool ball. Correlate for fecal impaction. 3. Enlarging now 2.6 cm indeterminate right renal anterior upper pole lesion,  slowly increasing in size since 2017. Renal cell carcinoma is not excluded. Recommend non-emergent outpatient renal protocol MRI with and without contrast for further evaluation. 4. Aortic Atherosclerosis (ICD10-I70.0). Electronically Signed   By: Titus Dubin M.D.   On: 10/07/2021 13:24   DG Abdomen 1 View  Result Date: 10/07/2021 CLINICAL DATA:  Coffee-ground emesis EXAM: ABDOMEN - 1 VIEW COMPARISON:  Portable exam 1205 hours compared to CT abdomen and pelvis 08/11/2016 FINDINGS: Increased stool in rectum. Gas and normal stool throughout remainder of colon. Gaseous distention of stomach. Small bowel gas pattern normal. No bowel dilatation or wall thickening otherwise identified. Bones demineralized. IMPRESSION: Increased stool in rectum Electronically Signed   By: Lavonia Dana M.D.   On: 10/07/2021 12:24   DG Chest Portable 1 View  Result Date: 10/07/2021 CLINICAL DATA:  Vomiting, coffee-ground emesis, increased weakness EXAM: PORTABLE CHEST 1 VIEW COMPARISON:  Portable exam 1203 hours compared to 05/23/2021 FINDINGS: LEFT subclavian sequential transvenous pacemaker leads project at RIGHT atrium and RIGHT ventricle. Normal heart size, mediastinal contours, and pulmonary vascularity. Atherosclerotic calcification aorta. Rotated to the LEFT. Mild bibasilar atelectasis versus infiltrate. Remaining lungs clear. No pleural effusion or pneumothorax.  Bones demineralized. IMPRESSION: Bibasilar atelectasis versus infiltrate. Electronically Signed   By: Lavonia Dana M.D.   On: 10/07/2021 12:22       Thanks   LOS: 0 days   @Ardean Melroy  Simonne Maffucci, MD, [facility] @  10/07/2021, 4:30 PM

## 2021-10-07 NOTE — ED Provider Notes (Signed)
[facility] DEPT Provider Note   CSN: [id number] Arrival date & time: 10/07/21  1106     History  Chief Complaint  Patient presents with   Hematemesis    Darrell [name] is a 71 y.o. male who is bedbound, FULL CODE per bedside MOST form, presented to ED with concern for dark vomit.  His wife called EMS as the patient had a large episode of dark appearing vomitus.  EMS reports vital signs have been normal since their arrival.  No further vomiting episodes.  The patient denies any chest pain or abdominal pain.  He denies nausea.  He cannot provide additional history.  His wife who is the patient's caretaker reports that he had a large episode of dark emesis this morning, which is the first time this is happened.  He typically has loose bowel movements and white stool.  He is on iron pills.  He does also suffer from constipation although she is not aware if he is on a laxative regimen.  He takes a baby aspirin 81 mg and no other blood thinners.  HPI     Home Medications Prior to Admission medications   Medication Sig Start Date End Date Taking? Authorizing Provider  albuterol (VENTOLIN HFA) 108 (90 Base) MCG/ACT inhaler Inhale 1-2 puffs into the lungs every 6 (six) hours as needed for wheezing or shortness of breath. 01/12/21   Argentina Donovan, PA-C  albuterol (VENTOLIN HFA) 108 (90 Base) MCG/ACT inhaler Inhale 2 puffs into the lungs every 6 (six) hours as needed for up to 30 days for Wheezing. 06/08/21   Argentina Donovan, PA-C  amLODipine (NORVASC) 5 MG tablet Take 1 tablet (5 mg total) by mouth daily. 06/08/21   Argentina Donovan, PA-C  aspirin EC 81 MG tablet Take 1 tablet (81 mg total) by mouth daily. 02/20/17   Funches, Adriana Mccallum, MD  atorvastatin (LIPITOR) 40 MG tablet Take 1 tablet (40 mg total) by mouth daily. 06/08/21   Argentina Donovan, PA-C  brimonidine (ALPHAGAN P) 0.1 % SOLN Place 1 drop into both eyes daily.     [provider]   Budeson-Glycopyrrol-Formoterol (BREZTRI AEROSPHERE) 160-9-4.8 MCG/ACT AERO Inhale 2 puffs into the lungs 2 (two) times daily. 08/01/21   [name], MD  cetirizine (ZYRTEC) 10 MG tablet Take 1 tablet (10 mg total) by mouth daily. 06/08/21   Argentina Donovan, PA-C  dicyclomine (BENTYL) 10 MG capsule TAKE 1 CAPSULE BY MOUTH IN THE MORNING AND AT BEDTIME 08/01/21   [name], MD  dorzolamide-timolol (COSOPT) 22.3-6.8 MG/ML ophthalmic solution Place 1 drop into both eyes daily.     [provider]  ferrous sulfate (FEROSUL) 325 (65 FE) MG tablet Take 1 tablet (325 mg total) by mouth 2 (two) times daily with a meal. 06/08/21   McClung, Dionne Bucy, PA-C  fluticasone (FLONASE) 50 MCG/ACT nasal spray Place 2 sprays into both nostrils daily. 01/12/21   Argentina Donovan, PA-C  glycopyrrolate (ROBINUL) 1 MG tablet Take 1 tablet (1 mg total) by mouth 2 times daily. 08/01/21   [name], MD  guaiFENesin-dextromethorphan (ROBITUSSIN DM) 100-10 MG/5ML syrup Take 5 mLs by mouth every 4 (four) hours as needed for cough. 05/07/21   Shelly Coss, MD  hydrOXYzine (ATARAX/VISTARIL) 25 MG tablet Take 1 tablet (25 mg total) by mouth at bedtime. 08/02/20   [name], MD  latanoprost (XALATAN) 0.005 % ophthalmic solution Place 1 drop into both eyes at bedtime.    [provider]  levETIRAcetam (KEPPRA) 500 MG tablet Take 1 tablet (500 mg total) by mouth 2 (two) times daily. 12/13/20   Penumalli, Earlean Polka, MD  lidocaine (LIDODERM) 5 % Apply patch to painful area. Patch may remain in place for up to 12 hours in a 24 hour period. 06/08/21   Argentina Donovan, PA-C  meloxicam (MOBIC) 15 MG tablet Take 1 tablet by mouth once daily 04/06/21   Edrick Kins, DPM  metoprolol tartrate (LOPRESSOR) 50 MG tablet Take 1 tablet (50 mg total) by mouth 2 (two) times daily. 06/08/21   Argentina Donovan, PA-C  Misc. Devices MISC Yankauer suction. Dx: ALS 11/11/19   [name], MD  Misc. Devices MISC  Coughalator 04/01/20   [name], MD  Misc. Devices MISC Nebulizer machine.  Diagnosis- Motor neuron disease 04/01/20   [name], MD  Misc. [facility] Bed FYB01.75 06/01/20   [name], MD  Misc. [facility] bed extension. Diagnosis: ALS. Height 6'4" 06/10/20   [name], MD  Misc. Devices MISC Manual wheelchair.  Diagnosis-ALS 05/04/21   [name], MD  Multiple Vitamin (MULTI-VITAMIN) tablet Take by mouth.    [provider]  Multiple Vitamins-Iron (DAILY VITAMIN FORMULA+IRON) TABS Take 1 tablet by mouth daily.    [provider]  Multiple Vitamins-Iron (MULTIVITAMINS WITH IRON) TABS tablet Take 1 tablet by mouth.    [provider]  tamsulosin (FLOMAX) 0.4 MG CAPS capsule Take 1 capsule (0.4 mg total) by mouth daily. 08/01/21   [name], MD      Allergies    Ace inhibitors, Doxycycline, Atacand hct [candesartan cilexetil-hctz], and Shellfish allergy    Review of Systems   Review of Systems  Physical Exam Updated Vital Signs BP (!) 147/89    Pulse 87    Temp 97.9 F (36.6 C) (Oral)    Resp 18    SpO2 95%  Physical Exam Constitutional:      Comments: Thin, frail, chronically ill-appearing.  HENT:     Head: Normocephalic and atraumatic.  Eyes:     Conjunctiva/sclera: Conjunctivae normal.     Pupils: Pupils are equal, round, and reactive to light.  Cardiovascular:     Rate and Rhythm: Normal rate and regular rhythm.  Pulmonary:     Effort: Pulmonary effort is normal. No respiratory distress.     Comments: Wet cough Abdominal:     General: There is no distension.     Tenderness: There is no abdominal tenderness.  Genitourinary:    Comments: Rectal exam, soft stool in rectal vault, no gross melena or blood Skin:    General: Skin is warm and dry.  Neurological:     General: No focal deficit present.     Mental Status: He is alert. Mental status is at baseline.    ED Results / Procedures /  Treatments   Labs (all labs ordered are listed, but only abnormal results are displayed) Labs Reviewed  BASIC METABOLIC PANEL - Abnormal; Notable for the following components:      Result Value   Potassium 6.2 (*)    Glucose, Bld 164 (*)    BUN 69 (*)    Creatinine, Ser 2.57 (*)    GFR, Estimated 26 (*)    All other components within normal limits  CBC WITH DIFFERENTIAL/PLATELET - Abnormal; Notable for the following components:   WBC 13.4 (*)    RBC 3.61 (*)    Hemoglobin 10.7 (*)  HCT 34.4 (*)    Neutro Abs 11.1 (*)    All other components within normal limits  LIPASE, BLOOD - Abnormal; Notable for the following components:   Lipase 55 (*)    All other components within normal limits  HEPATIC FUNCTION PANEL - Abnormal; Notable for the following components:   Total Protein 6.4 (*)    Albumin 3.4 (*)    AST 14 (*)    All other components within normal limits  RESP PANEL BY RT-PCR (FLU A&B, COVID) ARPGX2  POTASSIUM  POC OCCULT BLOOD, ED  TYPE AND SCREEN  ABO/RH  TROPONIN I (HIGH SENSITIVITY)  TROPONIN I (HIGH SENSITIVITY)    EKG EKG Interpretation  Date/Time:  Friday October 07 2021 11:15:16 EST Ventricular Rate:  95 PR Interval:  185 QRS Duration: 148 QT Interval:  394 QTC Calculation: 496 R Axis:   202 Text Interpretation: Paced rhythm, no sig change from prior tracing Confirmed by Octaviano Glow [phone]) on 10/07/2021 11:47:07 AM  Radiology CT ABDOMEN PELVIS WO CONTRAST  Result Date: 10/07/2021 CLINICAL DATA:  Hematemesis.  Denies abdominal pain. EXAM: CT ABDOMEN AND PELVIS WITHOUT CONTRAST TECHNIQUE: Multidetector CT imaging of the abdomen and pelvis was performed following the standard protocol without IV contrast. COMPARISON:  CT abdomen and pelvis dated May 05, 2019. FINDINGS: Lower chest: No acute abnormality. Scarring and atelectasis in the lingula and left-greater-than-right lower lobes. Hepatobiliary: Unchanged 2.5 cm low-density lesion in the anterior  left hepatic lobe, previously characterized as a hemangioma. No new focal liver abnormality. The gallbladder is unremarkable. No biliary dilatation. Pancreas: Unremarkable. No pancreatic ductal dilatation or surrounding inflammatory changes. Spleen: Normal in size without focal abnormality. Adrenals/Urinary Tract: Adrenal glands are unremarkable. Enlarging now 2.6 cm indeterminate right renal anterior upper pole lesion, slowly increasing in size since 2017. Additional bilateral simple and complex renal cysts are not significantly changed. No renal calculi or hydronephrosis. Bladder is unremarkable. Stomach/Bowel: Distended distal esophagus and stomach. No bowel wall thickening, distention, or surrounding inflammatory changes. Large rectosigmoid stool ball. Normal appendix. Vascular/Lymphatic: Aortic atherosclerosis. No enlarged abdominal or pelvic lymph nodes. Reproductive: Prostate is unremarkable. Other: No free fluid or pneumoperitoneum. Musculoskeletal: No acute or significant osseous findings. IMPRESSION: 1. Distended distal esophagus and stomach without obstruction. 2. Large rectosigmoid stool ball. Correlate for fecal impaction. 3. Enlarging now 2.6 cm indeterminate right renal anterior upper pole lesion, slowly increasing in size since 2017. Renal cell carcinoma is not excluded. Recommend non-emergent outpatient renal protocol MRI with and without contrast for further evaluation. 4. Aortic Atherosclerosis (ICD10-I70.0). Electronically Signed   By: Titus Dubin M.D.   On: 10/07/2021 13:24   DG Abdomen 1 View  Result Date: 10/07/2021 CLINICAL DATA:  Coffee-ground emesis EXAM: ABDOMEN - 1 VIEW COMPARISON:  Portable exam 1205 hours compared to CT abdomen and pelvis 08/11/2016 FINDINGS: Increased stool in rectum. Gas and normal stool throughout remainder of colon. Gaseous distention of stomach. Small bowel gas pattern normal. No bowel dilatation or wall thickening otherwise identified. Bones demineralized.  IMPRESSION: Increased stool in rectum Electronically Signed   By: Lavonia Dana M.D.   On: 10/07/2021 12:24   DG Chest Portable 1 View  Result Date: 10/07/2021 CLINICAL DATA:  Vomiting, coffee-ground emesis, increased weakness EXAM: PORTABLE CHEST 1 VIEW COMPARISON:  Portable exam 1203 hours compared to 05/23/2021 FINDINGS: LEFT subclavian sequential transvenous pacemaker leads project at RIGHT atrium and RIGHT ventricle. Normal heart size, mediastinal contours, and pulmonary vascularity. Atherosclerotic calcification aorta. Rotated to the LEFT. Mild bibasilar atelectasis  versus infiltrate. Remaining lungs clear. No pleural effusion or pneumothorax. Bones demineralized. IMPRESSION: Bibasilar atelectasis versus infiltrate. Electronically Signed   By: Lavonia Dana M.D.   On: 10/07/2021 12:22    Procedures Procedures    Medications Ordered in ED Medications  ondansetron (ZOFRAN) injection 4 mg (4 mg Intravenous Not Given 10/07/21 1137)  bisacodyl (DULCOLAX) suppository 10 mg (has no administration in time range)  cefTRIAXone (ROCEPHIN) 1 g in sodium chloride 0.9 % 100 mL IVPB (has no administration in time range)  azithromycin (ZITHROMAX) 500 mg in sodium chloride 0.9 % 250 mL IVPB (has no administration in time range)  sodium chloride 0.9 % bolus 1,000 mL (1,000 mLs Intravenous New Bag/Given 10/07/21 1255)    ED Course/ Medical Decision Making/ A&P Clinical Course as of 10/07/21 1402  Fri Oct 07, 2021  1150 Most recent hospital discharge from August reviewed, additional medical history obtained from discharge summary.  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 Tillman Abide) with secondary chronic substantial debility, prior CVA (2011), nicotine dependence, COPD and emphysema with chronic bronchitis, chronic kidney disease stage IIIb, hypertension, anemia of chronic disease,  seizure disorder, second-degree heart block status post pacemaker (2018), hyperlipidemia, benign prostatic hyperplasia "  [MT]  39 Gastroenterology office note also reviewed, most recent, showing a history of chronic ongoing constipation and dark stools in the setting of iron use [MT]  60 Discuss goals of care the patient and his wife, they would prefer hospitalization at this time for the 3 conditions that we are dealing with, which would include a likely pneumonia with his productive cough and leukocytosis, constipation (we will try rectal suppository at this time), and hyperK, which may be related to dehydration.  He is about halfway through a liter of fluid at this time.  We will recheck his potassium afterwards.  Otherwise I think he is stable for admission at this time. [MT]    Clinical Course User Index [MT] Alonzo Loving, Carola Rhine, MD                           Medical Decision Making  This patient presents to the ED with concern for dark emesis. This involves an extensive number of treatment options, and is a complaint that carries with it a high risk of complications and morbidity.  The differential diagnosis includes constipation versus fecal impaction first bowel obstruction versus upper GI bleed versus other  Co-morbidities that complicate the patient evaluation: Dementia, neurocognitive decline, please see ED course above.  Additional history obtained from patient's wife at bedside, EMS on arrival.  External records from outside source obtained and reviewed including GI office visit and records  I ordered and personally interpreted labs.  The pertinent results include: White blood cell count of 13.4, hemoglobin stable at 10.7, creatinine mildly elevated at 2.57, BUN 69, potassium elevated at 6.2, consistent with a prerenal AKI.  Lipase mildly elevated 55.  Troponin unremarkable.  I ordered imaging studies including x-ray of the chest and abdomen, CT of the abdomen I independently  visualized and interpreted imaging which showed constipation likely recto sigmoid impaction, atelectasis of the lung bases.  Check chest x-ray showed possible atelectasis versus aspiration. I agree with the radiologist interpretation  The patient was maintained on a cardiac monitor.  I personally viewed and interpreted the cardiac monitored which showed an underlying rhythm of: Paced rhythm  Per  my interpretation the patient's ECG shows paced rhythm no significant changes from prior tracing.  I ordered medication including IV fluids, IV Zofran for suspected dehydration and nausea. I have reviewed the patients home medicines and have made adjustments as needed  Test Considered:  -Doubt acute pulmonary embolism, sepsis  Problem List / ED Course: Pneumonia -consistent with productive coughing, leukocytosis, high aspiration risk.  Rocephin and azithromycin ordered.  COVID and flu are negative.  The patient is not hypoxic Vomiting -unclear if this is truly hematemesis, but hemoglobin appears stable, he is not on blood thinners, is not having epigastric pain to suspect bleeding ulcer.  Likely this is related to his significant constipation.  Do not see evidence of bowel obstruction, but he does have some fecal impaction.  I am not able to remove a hard stool ball from the rectal vault with my finger, but he may benefit from a suppository and laxative regimen for bowel cleanout in the hospital.  He and his wife are agreeable to this. Hyperkalemia -may be related to some dehydration with elevated BUN and creatinine level.  The patient receiving a liter of fluids will have the potassium rechecked.  I did not see acute hyperkalemic changes on his EKG.  After the interventions noted above, I reevaluated the patient and found that they have: improved  Social Determinants of Health: -Dementia, declining cognitive status, need for caretaker at home  Dispostion:  After consideration of the diagnostic  results and the patients response to treatment, I feel that the patent would benefit from hospitalization  Clinical Course as of 10/07/21 1402  Fri Oct 07, 2021  1150 Most recent hospital discharge from August reviewed, additional medical history obtained from discharge summary.  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 Tillman Abide) with secondary chronic substantial debility, prior CVA (2011), nicotine dependence, COPD and emphysema with chronic bronchitis, chronic kidney disease stage IIIb, hypertension, anemia of chronic disease, seizure disorder, second-degree heart block status post pacemaker (2018), hyperlipidemia, benign prostatic hyperplasia "  [MT]  87 Gastroenterology office note also reviewed, most recent, showing a history of chronic ongoing constipation and dark stools in the setting of iron use [MT]  51 Discuss goals of care the patient and his wife, they would prefer hospitalization at this time for the 3 conditions that we are dealing with, which would include a likely pneumonia with his productive cough and leukocytosis, constipation (we will try rectal suppository at this time), and hyperK, which may be related to dehydration.  He is about halfway through a liter of fluid at this time.  We will recheck his potassium afterwards.  Otherwise I think he is stable for admission at this time. [MT]    Clinical Course User Index [MT] Bernabe Dorce, Carola Rhine, MD           Final Clinical Impression(s) / ED Diagnoses Final diagnoses:  Community acquired pneumonia, unspecified laterality  Hyperkalemia  Fecal impaction [facility])    Rx / DC Orders ED Discharge Orders     None         Wyvonnia Dusky, MD 10/07/21 [phone]

## 2021-10-07 NOTE — ED Notes (Signed)
Admitting MD in to see pt, MD Olevia Bowens alerted this Probation officer pt was currently in [place], to get amiodarone and code cart. MD Olevia Bowens and MD Crosby Oyster at bedside. New EKG shot, pt in ventricular bigeminy at this time.

## 2021-10-08 ENCOUNTER — Inpatient Hospital Stay (HOSPITAL_COMMUNITY): Payer: Medicare HMO

## 2021-10-08 ENCOUNTER — Observation Stay (HOSPITAL_COMMUNITY): Payer: Medicare HMO

## 2021-10-08 ENCOUNTER — Encounter (HOSPITAL_COMMUNITY): Payer: Self-pay | Admitting: Internal Medicine

## 2021-10-08 ENCOUNTER — Other Ambulatory Visit: Payer: Self-pay

## 2021-10-08 ENCOUNTER — Emergency Department (HOSPITAL_COMMUNITY): Payer: Medicare HMO

## 2021-10-08 DIAGNOSIS — J9601 Acute respiratory failure with hypoxia: Secondary | ICD-10-CM | POA: Diagnosis not present

## 2021-10-08 DIAGNOSIS — Z20822 Contact with and (suspected) exposure to covid-19: Secondary | ICD-10-CM | POA: Diagnosis not present

## 2021-10-08 DIAGNOSIS — E875 Hyperkalemia: Secondary | ICD-10-CM | POA: Diagnosis not present

## 2021-10-08 DIAGNOSIS — G928 Other toxic encephalopathy: Secondary | ICD-10-CM | POA: Diagnosis not present

## 2021-10-08 DIAGNOSIS — A419 Sepsis, unspecified organism: Secondary | ICD-10-CM | POA: Diagnosis not present

## 2021-10-08 DIAGNOSIS — K5649 Other impaction of intestine: Secondary | ICD-10-CM | POA: Diagnosis not present

## 2021-10-08 DIAGNOSIS — N1832 Chronic kidney disease, stage 3b: Secondary | ICD-10-CM | POA: Diagnosis not present

## 2021-10-08 DIAGNOSIS — D649 Anemia, unspecified: Secondary | ICD-10-CM | POA: Diagnosis not present

## 2021-10-08 DIAGNOSIS — G9389 Other specified disorders of brain: Secondary | ICD-10-CM | POA: Diagnosis not present

## 2021-10-08 DIAGNOSIS — S0990XA Unspecified injury of head, initial encounter: Secondary | ICD-10-CM | POA: Diagnosis not present

## 2021-10-08 DIAGNOSIS — G319 Degenerative disease of nervous system, unspecified: Secondary | ICD-10-CM | POA: Diagnosis not present

## 2021-10-08 DIAGNOSIS — J690 Pneumonitis due to inhalation of food and vomit: Secondary | ICD-10-CM | POA: Diagnosis not present

## 2021-10-08 DIAGNOSIS — G40201 Localization-related (focal) (partial) symptomatic epilepsy and epileptic syndromes with complex partial seizures, not intractable, with status epilepticus: Secondary | ICD-10-CM | POA: Diagnosis not present

## 2021-10-08 DIAGNOSIS — I129 Hypertensive chronic kidney disease with stage 1 through stage 4 chronic kidney disease, or unspecified chronic kidney disease: Secondary | ICD-10-CM | POA: Diagnosis not present

## 2021-10-08 DIAGNOSIS — J189 Pneumonia, unspecified organism: Secondary | ICD-10-CM | POA: Diagnosis not present

## 2021-10-08 DIAGNOSIS — R918 Other nonspecific abnormal finding of lung field: Secondary | ICD-10-CM | POA: Diagnosis not present

## 2021-10-08 DIAGNOSIS — Z66 Do not resuscitate: Secondary | ICD-10-CM | POA: Diagnosis not present

## 2021-10-08 DIAGNOSIS — G40901 Epilepsy, unspecified, not intractable, with status epilepticus: Secondary | ICD-10-CM | POA: Diagnosis not present

## 2021-10-08 DIAGNOSIS — N179 Acute kidney failure, unspecified: Secondary | ICD-10-CM | POA: Diagnosis not present

## 2021-10-08 DIAGNOSIS — K920 Hematemesis: Secondary | ICD-10-CM

## 2021-10-08 DIAGNOSIS — R4182 Altered mental status, unspecified: Secondary | ICD-10-CM | POA: Diagnosis not present

## 2021-10-08 DIAGNOSIS — R569 Unspecified convulsions: Secondary | ICD-10-CM

## 2021-10-08 DIAGNOSIS — I639 Cerebral infarction, unspecified: Secondary | ICD-10-CM | POA: Diagnosis not present

## 2021-10-08 DIAGNOSIS — E872 Acidosis, unspecified: Secondary | ICD-10-CM | POA: Diagnosis not present

## 2021-10-08 LAB — CBC
HCT: 23.6 % — ABNORMAL LOW (ref 39.0–52.0)
Hemoglobin: 7.4 g/dL — ABNORMAL LOW (ref 13.0–17.0)
MCH: 30.2 pg (ref 26.0–34.0)
MCHC: 31.4 g/dL (ref 30.0–36.0)
MCV: 96.3 fL (ref 80.0–100.0)
Platelets: 159 10*3/uL (ref 150–400)
RBC: 2.45 MIL/uL — ABNORMAL LOW (ref 4.22–5.81)
RDW: 13.4 % (ref 11.5–15.5)
WBC: 10.9 10*3/uL — ABNORMAL HIGH (ref 4.0–10.5)
nRBC: 0 % (ref 0.0–0.2)

## 2021-10-08 LAB — BASIC METABOLIC PANEL
Anion gap: 9 (ref 5–15)
Anion gap: 9 (ref 5–15)
BUN: 130 mg/dL — ABNORMAL HIGH (ref 8–23)
BUN: 129 mg/dL — ABNORMAL HIGH (ref 8–23)
CO2: 17 mmol/L — ABNORMAL LOW (ref 22–32)
CO2: 17 mmol/L — ABNORMAL LOW (ref 22–32)
Calcium: 9.1 mg/dL (ref 8.9–10.3)
Calcium: 9.3 mg/dL (ref 8.9–10.3)
Chloride: 115 mmol/L — ABNORMAL HIGH (ref 98–111)
Chloride: 115 mmol/L — ABNORMAL HIGH (ref 98–111)
Creatinine, Ser: 3.47 mg/dL — ABNORMAL HIGH (ref 0.61–1.24)
Creatinine, Ser: 3.43 mg/dL — ABNORMAL HIGH (ref 0.61–1.24)
GFR, Estimated: 18 mL/min — ABNORMAL LOW (ref >60)
GFR, Estimated: 19 mL/min — ABNORMAL LOW (ref >60)
Glucose, Bld: 120 mg/dL — ABNORMAL HIGH (ref 70–99)
Glucose, Bld: 172 mg/dL — ABNORMAL HIGH (ref 70–99)
Potassium: 6.8 mmol/L (ref 3.5–5.1)
Potassium: 6.2 mmol/L — ABNORMAL HIGH (ref 3.5–5.1)
Sodium: 141 mmol/L (ref 135–145)
Sodium: 141 mmol/L (ref 135–145)

## 2021-10-08 LAB — GLUCOSE, CAPILLARY
Glucose-Capillary: 119 mg/dL — ABNORMAL HIGH (ref 70–99)
Glucose-Capillary: 159 mg/dL — ABNORMAL HIGH (ref 70–99)
Glucose-Capillary: 137 mg/dL — ABNORMAL HIGH (ref 70–99)
Glucose-Capillary: 142 mg/dL — ABNORMAL HIGH (ref 70–99)

## 2021-10-08 LAB — COMPREHENSIVE METABOLIC PANEL
ALT: 11 U/L (ref 0–44)
AST: 10 U/L — ABNORMAL LOW (ref 15–41)
Albumin: 2.9 g/dL — ABNORMAL LOW (ref 3.5–5.0)
Alkaline Phosphatase: 58 U/L (ref 38–126)
Anion gap: 6 (ref 5–15)
BUN: 122 mg/dL — ABNORMAL HIGH (ref 8–23)
CO2: 19 mmol/L — ABNORMAL LOW (ref 22–32)
Calcium: 9 mg/dL (ref 8.9–10.3)
Chloride: 116 mmol/L — ABNORMAL HIGH (ref 98–111)
Creatinine, Ser: 2.94 mg/dL — ABNORMAL HIGH (ref 0.61–1.24)
GFR, Estimated: 22 mL/min — ABNORMAL LOW (ref >60)
Glucose, Bld: 161 mg/dL — ABNORMAL HIGH (ref 70–99)
Potassium: 6.7 mmol/L (ref 3.5–5.1)
Sodium: 141 mmol/L (ref 135–145)
Total Bilirubin: 0.5 mg/dL (ref 0.3–1.2)
Total Protein: 5.2 g/dL — ABNORMAL LOW (ref 6.5–8.1)

## 2021-10-08 LAB — HEMOGLOBIN AND HEMATOCRIT, BLOOD
HCT: 22.2 % — ABNORMAL LOW (ref 39.0–52.0)
Hemoglobin: 7 g/dL — ABNORMAL LOW (ref 13.0–17.0)

## 2021-10-08 LAB — RESP PANEL BY RT-PCR (FLU A&B, COVID) ARPGX2
Influenza A by PCR: NEGATIVE
Influenza B by PCR: NEGATIVE
SARS Coronavirus 2 by RT PCR: NEGATIVE

## 2021-10-08 LAB — CBG MONITORING, ED
Glucose-Capillary: 149 mg/dL — ABNORMAL HIGH (ref 70–99)
Glucose-Capillary: 206 mg/dL — ABNORMAL HIGH (ref 70–99)

## 2021-10-08 LAB — POTASSIUM: Potassium: 6.1 mmol/L — ABNORMAL HIGH (ref 3.5–5.1)

## 2021-10-08 MED ORDER — LORAZEPAM 2 MG/ML IJ SOLN
2.0000 mg | Freq: Once | INTRAMUSCULAR | Status: AC
  Administered 2021-10-08: 2 mg via INTRAVENOUS
  Filled 2021-10-08: qty 1

## 2021-10-08 MED ORDER — METOPROLOL TARTRATE 25 MG PO TABS: 25.0000 mg | ORAL_TABLET | Freq: Two times a day (BID) | ORAL | Status: DC

## 2021-10-08 MED ORDER — SODIUM ZIRCONIUM CYCLOSILICATE 10 G PO PACK: 10.0000 g | PACK | Freq: Once | ORAL | Status: DC

## 2021-10-08 MED ORDER — OCTREOTIDE LOAD VIA INFUSION
25.0000 ug | Freq: Once | INTRAVENOUS | Status: AC
  Administered 2021-10-08: 25 ug via INTRAVENOUS
  Filled 2021-10-08: qty 13

## 2021-10-08 MED ORDER — CHLORHEXIDINE GLUCONATE CLOTH 2 % EX PADS
6.0000 | MEDICATED_PAD | Freq: Every day | CUTANEOUS | Status: DC
  Administered 2021-10-10 – 2021-10-11 (×2): 6 via TOPICAL

## 2021-10-08 MED ORDER — SODIUM CHLORIDE 0.9 % IV SOLN
50.0000 ug/h | INTRAVENOUS | Status: DC
  Administered 2021-10-08 – 2021-10-09 (×3): 50 ug/h via INTRAVENOUS
  Filled 2021-10-08 (×3): qty 1

## 2021-10-08 MED ORDER — DEXTROSE 50 % IV SOLN
1.0000 | Freq: Once | INTRAVENOUS | Status: AC
  Administered 2021-10-08: 50 mL via INTRAVENOUS
  Filled 2021-10-08: qty 50

## 2021-10-08 MED ORDER — PANTOPRAZOLE INFUSION (NEW) - SIMPLE MED: 8.0000 mg/h | INTRAVENOUS | Status: DC

## 2021-10-08 MED ORDER — BISACODYL 10 MG RE SUPP: 10.0000 mg | Freq: Every day | RECTAL | Status: DC | PRN

## 2021-10-08 MED ORDER — INSULIN ASPART 100 UNIT/ML IV SOLN
10.0000 [IU] | Freq: Once | INTRAVENOUS | Status: AC
  Administered 2021-10-08: 10 [IU] via INTRAVENOUS
  Filled 2021-10-08: qty 0.1

## 2021-10-08 MED ORDER — ALBUTEROL SULFATE (2.5 MG/3ML) 0.083% IN NEBU
10.0000 mg | INHALATION_SOLUTION | Freq: Once | RESPIRATORY_TRACT | Status: AC
  Administered 2021-10-08: 10 mg via RESPIRATORY_TRACT
  Filled 2021-10-08: qty 12

## 2021-10-08 MED ORDER — SODIUM BICARBONATE 8.4 % IV SOLN
INTRAVENOUS | Status: DC
  Filled 2021-10-08 (×3): qty 1000

## 2021-10-08 MED ORDER — DEXTROSE 50 % IV SOLN
INTRAVENOUS | Status: AC
  Administered 2021-10-08: 50 mL via INTRAVENOUS
  Filled 2021-10-08: qty 50

## 2021-10-08 MED ORDER — SODIUM CHLORIDE 0.9 % IV SOLN
3.0000 g | Freq: Two times a day (BID) | INTRAVENOUS | Status: DC
  Administered 2021-10-08 – 2021-10-10 (×5): 3 g via INTRAVENOUS
  Filled 2021-10-08 (×8): qty 8

## 2021-10-08 MED ORDER — SODIUM ZIRCONIUM CYCLOSILICATE 10 G PO PACK
10.0000 g | PACK | Freq: Three times a day (TID) | ORAL | Status: DC
Start: 2021-10-08 — End: 2021-10-08

## 2021-10-08 MED ORDER — PANTOPRAZOLE 80MG IVPB - SIMPLE MED: 80.0000 mg | Freq: Once | INTRAVENOUS | Status: DC

## 2021-10-08 MED ORDER — PANTOPRAZOLE SODIUM 40 MG IV SOLR: 40.0000 mg | Freq: Two times a day (BID) | INTRAVENOUS | Status: DC

## 2021-10-08 MED ORDER — FUROSEMIDE 10 MG/ML IJ SOLN
40.0000 mg | Freq: Once | INTRAMUSCULAR | Status: AC
  Administered 2021-10-08: 40 mg via INTRAVENOUS
  Filled 2021-10-08: qty 4

## 2021-10-08 MED ORDER — PANTOPRAZOLE SODIUM 40 MG IV SOLR
40.0000 mg | Freq: Two times a day (BID) | INTRAVENOUS | Status: DC
  Administered 2021-10-08 – 2021-10-11 (×7): 40 mg via INTRAVENOUS
  Filled 2021-10-08 (×7): qty 40

## 2021-10-08 MED ORDER — DEXTROSE 10 % IV SOLN: Freq: Once | INTRAVENOUS | Status: AC

## 2021-10-08 MED ORDER — CALCIUM GLUCONATE-NACL 1-0.675 GM/50ML-% IV SOLN
1.0000 g | Freq: Once | INTRAVENOUS | Status: AC
  Administered 2021-10-08: 1000 mg via INTRAVENOUS
  Filled 2021-10-08: qty 50

## 2021-10-08 MED ORDER — DEXTROSE-NACL 5-0.9 % IV SOLN: INTRAVENOUS | Status: DC

## 2021-10-08 MED ORDER — INSULIN ASPART 100 UNIT/ML IV SOLN
10.0000 [IU] | Freq: Once | INTRAVENOUS | Status: AC
  Administered 2021-10-08: 10 [IU] via INTRAVENOUS

## 2021-10-08 MED ORDER — LEVETIRACETAM IN NACL 500 MG/100ML IV SOLN
500.0000 mg | Freq: Two times a day (BID) | INTRAVENOUS | Status: DC
  Administered 2021-10-08 – 2021-10-11 (×6): 500 mg via INTRAVENOUS
  Filled 2021-10-08 (×6): qty 100

## 2021-10-08 MED ORDER — DEXTROSE 50 % IV SOLN: 1.0000 | Freq: Once | INTRAVENOUS | Status: AC

## 2021-10-08 NOTE — Consult Note (Addendum)
NEURO HOSPITALIST CONSULT NOTE   Requesting physician: Dr. Avon Gully  Reason for Consult:Possible status epilepticus  History obtained from:  Hospitalist and Chart     HPI:                                                                                                                                          TORRENCE HAMMACK is an 71 y.o. male with a PMHx history of ALS x 3 years, CKD3, CIDP, hypertensive retinopathy, HTN, HLD, stroke in 2011 with left-hemipelgia, bedbound at baseline, history of pacemaker placement, epilepsy with complex partial seizures and vascular dementia who presented to the [facility] ED on Friday afternoon with FTT. Overnight he was noted to be unresponsive and Code Stroke was called with Teleneurology consulted.   Teleneurology HPI has been reviewed: "Patient with a baseline of post stroke L hemiplegia 2012, bed bound, dementia, speaks in limited phrases to make needs known to caregivers, HTN, seizures, HLD. Patient brought in by wife for Malkiewicz / bloody sputum, concern for upper GI bleed , AKI, hyperkalemia while boarding in the ER. Patient had an acute onset change in responsiveness this morning at 6:15, and his L side was even more droopy than normal as per wife and RN (face now looks close to baseline, but patient is still somnolent). Potassium and Cr elevated worse this morning compared with last night, K up to 6.7 from 5.7 and BUN up to 122 from 69 last night. Patient also with R leg twitching and hiccuping noted by RN, but this is not a known seizure semiology for him. He received home keppra dose 500 BID while here."  Teleneurologist exam revealed an NIHSS of 31, attributable primarily to the patient's unresponsive state.   Impression per Teleneurology was as follows:  " 1) poor baseline brain function (I count at least 5 prior strokes on CT head) and poor baseline neuro exam   2) Uremic encephalopathy BUN 122   3) Hyperkalemia may be contributing to  diffuse weakness via prevention of muscle repolarization   4) Severe constipation with large sigmoid stool ball and distension of stomach/esophagus and spitting up small amounts of blood per wife   5) Hx seizures now with intermittent R leg twitching"   Plan per Teleneurology was as follows:  "Unable to give tpa due to his only ongoing new symptoms being sleepiness and diffuse weakness which are confounded by acute medical condition - I am not sure he actually has a GI bleed that would be a hard contraindication to tpa, but this was also discussed as a contraindication with his wife. Keppra 500 mg BID with rising therapeutic levels in setting of renal dysfunction, so ok to continue dose.  CHECK EEG - advised RN stat - this would be an acutely  treatable cause of somnolence of focal status / nonconvulsive status is found.  Discussed with primary MD his ongoing treatment of hyperK and options for expediting clearance of his uremia"  Past Medical History:  Diagnosis Date   Arthritis    At high risk for falls 08/16/2015   Bradycardia    Chronic kidney disease    stage 3 GFR 30-59 ml/min    CIDP (chronic inflammatory demyelinating polyneuropathy) ([name])    CKD (chronic kidney disease) stage 3, GFR 30-59 ml/min ([place]) 08/16/2015   Dysphagia as late effect of cerebrovascular disease    pts wife states pt has to eat soft foods    Elevated liver enzymes 08/10/2016   GERD (gastroesophageal reflux disease)    Glaucoma    High cholesterol    History of CVA with residual deficit 03/25/2013   Hypertension    Hypertensive retinopathy of both eyes 01/16/2017   Inguinal hernia 03/25/2013   Liver hemangioma 08/14/2016   Neuromuscular disorder ([place])    chronic inflammatory demyelinating polyneuropathy    New onset seizure ([place]) 07/08/2017   seizure 07/14/18   Nuclear sclerosis of both eyes 10/25/2016   Presence of permanent cardiac pacemaker    placed in april 2018   Primary open angle glaucoma of both eyes,  indeterminate stage 10/25/2016   Renal mass, right 08/14/2016   Status cardiac pacemaker 01/29/2017   Placed for second degree heart block on 01/16/17 Medtronic Azure XT DR MRI SureScan dual-chamber pacemaker   Stroke [facility])    2011 with residual deficit left sided weakness   Tobacco dependence     Past Surgical History:  Procedure Laterality Date   EYE SURGERY     HERNIA REPAIR     INGUINAL HERNIA REPAIR Right 11/23/2014   Procedure: right inguinal hernia repair with mesh;  Surgeon: Armandina Gemma, MD;  Location: [facility];  Service: General;  Laterality: Right;   INSERTION OF MESH N/A 11/23/2014   Procedure: INSERTION OF MESH;  Surgeon: Armandina Gemma, MD;  Location: [facility];  Service: General;  Laterality: N/A;   MASS EXCISION Left 08/29/2017   Procedure: EXCISION OF LEFT NECK MASS;  Surgeon: Coralie Keens, MD;  Location: [facility];  Service: General;  Laterality: Left;   PACEMAKER IMPLANT N/A 01/16/2017   Procedure: Pacemaker Implant;  Surgeon: Will Meredith Leeds, MD;  Location: [place] [facility];  Service: Cardiovascular;  Laterality: N/A;   SHOULDER SURGERY Bilateral 1988, 1998    Family History  Problem Relation Age of Onset   Hypertension Mother    Diabetes Sister    Hypertension Sister    Cancer Sister        1 sister   COPD Sister        in 1 sister   Stomach cancer Neg Hx    Colon cancer Neg Hx    Pancreatic cancer Neg Hx    Esophageal cancer Neg Hx              Social History:  reports that he has been smoking cigarettes. He has a 40.00 pack-year smoking history. He has never used smokeless tobacco. He reports that he does not drink alcohol and does not use drugs.  Allergies  Allergen Reactions   Ace Inhibitors Other (See Comments)    Hyperkalemia   Doxycycline Other (See Comments)    Hiccups, cough, nausea and emesis, elevated liver enzymes, elevated eosinophils, SOB concerning for early DRESS syndrome    Atacand Hct [Candesartan Cilexetil-Hctz] Hives   Shellfish Allergy  Hives    HOME MEDICATIONS:                                                                                                                      No current facility-administered medications on file prior to encounter.   Current Outpatient Medications on File Prior to Encounter  Medication Sig Dispense Refill   albuterol (VENTOLIN HFA) 108 (90 Base) MCG/ACT inhaler Inhale 2 puffs into the lungs every 6 (six) hours as needed for up to 30 days for Wheezing. (Patient taking differently: Inhale 2 puffs into the lungs as needed for wheezing or shortness of breath.) 18 g 5   amLODipine (NORVASC) 5 MG tablet Take 1 tablet (5 mg total) by mouth daily. 90 tablet 1   aspirin EC 81 MG tablet Take 1 tablet (81 mg total) by mouth daily. 90 tablet 3   atorvastatin (LIPITOR) 40 MG tablet Take 1 tablet (40 mg total) by mouth daily. 90 tablet 1   azithromycin (ZITHROMAX) 500 MG tablet Take 500 mg by mouth 3 (three) times a week. Mon, wed, & fri.     brimonidine (ALPHAGAN) 0.2 % ophthalmic solution Place 1 drop into both eyes 3 times daily.     Budeson-Glycopyrrol-Formoterol (BREZTRI AEROSPHERE) 160-9-4.8 MCG/ACT AERO Inhale 2 puffs into the lungs 2 (two) times daily. (Patient taking differently: Inhale 2 puffs into the lungs as needed (SOB, wheezing).) 10.7 g 6   cetirizine (ZYRTEC) 10 MG tablet Take 1 tablet (10 mg total) by mouth daily. 90 tablet 0   dicyclomine (BENTYL) 10 MG capsule TAKE 1 CAPSULE BY MOUTH IN THE MORNING AND AT BEDTIME (Patient taking differently: Take 10 mg by mouth daily.) 60 capsule 6   ferrous sulfate (FEROSUL) 325 (65 FE) MG tablet Take 1 tablet (325 mg total) by mouth 2 (two) times daily with a meal. 60 tablet 2   fluticasone (FLONASE) 50 MCG/ACT nasal spray Place 2 sprays into both nostrils daily. 16 g 3   glycopyrrolate (ROBINUL) 1 MG tablet Take 1 tablet (1 mg total) by mouth 2 times daily. 60 tablet 5   guaiFENesin-dextromethorphan (ROBITUSSIN DM) 100-10 MG/5ML syrup Take 5 mLs by  mouth every 4 (four) hours as needed for cough. 236 mL 0   hydrOXYzine (ATARAX/VISTARIL) 25 MG tablet Take 1 tablet (25 mg total) by mouth at bedtime. 30 tablet 1   levETIRAcetam (KEPPRA) 500 MG tablet Take 1 tablet (500 mg total) by mouth 2 (two) times daily. 180 tablet 4   lidocaine (LIDODERM) 5 % Apply patch to painful area. Patch may remain in place for up to 12 hours in a 24 hour period. 30 patch 0   metoprolol tartrate (LOPRESSOR) 50 MG tablet Take 1 tablet (50 mg total) by mouth 2 (two) times daily. 180 tablet 3   montelukast (SINGULAIR) 10 MG tablet Take 10 mg by mouth at bedtime.     Multiple Vitamin (MULTI-VITAMIN) tablet Take by mouth.     tamsulosin (FLOMAX) 0.4 MG CAPS capsule Take  1 capsule (0.4 mg total) by mouth daily. 90 capsule 1   torsemide (DEMADEX) 20 MG tablet Take 20 mg by mouth as needed (swelling).     brimonidine (ALPHAGAN) 0.2 % ophthalmic solution 1 drop 3 (three) times daily. (Patient not taking: Reported on 10/07/2021)     dorzolamide-timolol (COSOPT) 22.3-6.8 MG/ML ophthalmic solution Place 1 drop into both eyes daily.      latanoprost (XALATAN) 0.005 % ophthalmic solution Place 1 drop into both eyes nightly.     meloxicam (MOBIC) 15 MG tablet Take 1 tablet by mouth once daily (Patient not taking: Reported on 10/08/2021) 30 tablet 0   Misc. Devices MISC Yankauer suction. Dx: ALS 1 each 0   Misc. Devices MISC Coughalator 1 each 0   Misc. Devices MISC Nebulizer machine.  Diagnosis- Motor neuron disease 1 each 0   Misc. Devices [facility] Bed DXg12.20 1 each 0   Misc. [place] bed extension. Diagnosis: ALS. Height 6'4" 1 each 0   Misc. Devices MISC Manual wheelchair.  Diagnosis-ALS 1 each 0     ROS:                                                                                                                                       Unable to obtain due to AMS.   Blood pressure 102/66, pulse 73, temperature 98.6 F (37 C), temperature  source Oral, resp. rate 14, weight 78.4 kg, SpO2 95 %.   General Examination:                                                                                                       Physical Exam  HEENT-  /AT     Lungs- Has an unusual breathing pattern with relatively normal inhalations, but with staccato exhalations.   Extremities- No edema. There is asymmetric muscle wasting, LLE worse than right, and in forearms and intrinsic hand muscles, left worse than right.   Neurological Examination Mental Status: Unresponsive to auditory and noxious stimuli. Does not follow any commands. No attempts to communicate.  Cranial Nerves: II: No blink to threat. PERRL. III,IV, VI: Eyes mildly dysconjugate. No forced gaze deviation or nystagmus.  V: Blinks to eyelid stimulation.  VII: Does not grimace to noxious. Face grossly symmetric at rest VIII: Not responding to voice IX,X: Unable to assess palate XI: Head is midline XII: Does not follow command for tongue protrusion Motor/Sensory: Muscle atrophy as above. Contractures to digits of hands  bilaterally Decreased tone x 4 except at locations of contractures.  Slight movement of all 4 extremities to noxious stimuli, but no antigravity movement.  Deep Tendon Reflexes: 1+ bilateral brachioradialis and biceps. 2+ bilateral triceps. 3+ bilateral patellae. 2+ bilateral achilles. Toes mute bilaterally. No Hoffman's reflex. Cerebellar/Gait: Unable to assess Other: No myoclonus, jerking or twitching noted.    Lab Results: Basic Metabolic Panel: Recent Labs  Lab 10/07/21 1124 10/07/21 1243 10/08/21 0515 10/08/21 0858  NA 138  --  141  --   K 6.2* 5.7* 6.7* 6.1*  CL 107  --  116*  --   CO2 25  --  19*  --   GLUCOSE 164*  --  161*  --   BUN 69*  --  122*  --   CREATININE 2.57*  --  2.94*  --   CALCIUM 9.6  --  9.0  --     CBC: Recent Labs  Lab 10/07/21 1124  WBC 13.4*  NEUTROABS 11.1*  HGB 10.7*  HCT 34.4*  MCV 95.3  PLT 217     Cardiac Enzymes: No results for input(s): CKTOTAL, CKMB, CKMBINDEX, TROPONINI in the last 168 hours.  Lipid Panel: No results for input(s): CHOL, TRIG, HDL, CHOLHDL, VLDL, LDLCALC in the last 168 hours.  Imaging: CT ABDOMEN PELVIS WO CONTRAST  Result Date: 10/07/2021 CLINICAL DATA:  Hematemesis.  Denies abdominal pain. EXAM: CT ABDOMEN AND PELVIS WITHOUT CONTRAST TECHNIQUE: Multidetector CT imaging of the abdomen and pelvis was performed following the standard protocol without IV contrast. COMPARISON:  CT abdomen and pelvis dated May 05, 2019. FINDINGS: Lower chest: No acute abnormality. Scarring and atelectasis in the lingula and left-greater-than-right lower lobes. Hepatobiliary: Unchanged 2.5 cm low-density lesion in the anterior left hepatic lobe, previously characterized as a hemangioma. No new focal liver abnormality. The gallbladder is unremarkable. No biliary dilatation. Pancreas: Unremarkable. No pancreatic ductal dilatation or surrounding inflammatory changes. Spleen: Normal in size without focal abnormality. Adrenals/Urinary Tract: Adrenal glands are unremarkable. Enlarging now 2.6 cm indeterminate right renal anterior upper pole lesion, slowly increasing in size since 2017. Additional bilateral simple and complex renal cysts are not significantly changed. No renal calculi or hydronephrosis. Bladder is unremarkable. Stomach/Bowel: Distended distal esophagus and stomach. No bowel wall thickening, distention, or surrounding inflammatory changes. Large rectosigmoid stool ball. Normal appendix. Vascular/Lymphatic: Aortic atherosclerosis. No enlarged abdominal or pelvic lymph nodes. Reproductive: Prostate is unremarkable. Other: No free fluid or pneumoperitoneum. Musculoskeletal: No acute or significant osseous findings. IMPRESSION: 1. Distended distal esophagus and stomach without obstruction. 2. Large rectosigmoid stool ball. Correlate for fecal impaction. 3. Enlarging now 2.6 cm  indeterminate right renal anterior upper pole lesion, slowly increasing in size since 2017. Renal cell carcinoma is not excluded. Recommend non-emergent outpatient renal protocol MRI with and without contrast for further evaluation. 4. Aortic Atherosclerosis (ICD10-I70.0). Electronically Signed   By: Titus Dubin M.D.   On: 10/07/2021 13:24   DG Abdomen 1 View  Result Date: 10/07/2021 CLINICAL DATA:  Coffee-ground emesis EXAM: ABDOMEN - 1 VIEW COMPARISON:  Portable exam 1205 hours compared to CT abdomen and pelvis 08/11/2016 FINDINGS: Increased stool in rectum. Gas and normal stool throughout remainder of colon. Gaseous distention of stomach. Small bowel gas pattern normal. No bowel dilatation or wall thickening otherwise identified. Bones demineralized. IMPRESSION: Increased stool in rectum Electronically Signed   By: Lavonia Dana M.D.   On: 10/07/2021 12:24   DG Chest Portable 1 View  Result Date: 10/07/2021 CLINICAL DATA:  Vomiting, coffee-ground  emesis, increased weakness EXAM: PORTABLE CHEST 1 VIEW COMPARISON:  Portable exam 1203 hours compared to 05/23/2021 FINDINGS: LEFT subclavian sequential transvenous pacemaker leads project at RIGHT atrium and RIGHT ventricle. Normal heart size, mediastinal contours, and pulmonary vascularity. Atherosclerotic calcification aorta. Rotated to the LEFT. Mild bibasilar atelectasis versus infiltrate. Remaining lungs clear. No pleural effusion or pneumothorax. Bones demineralized. IMPRESSION: Bibasilar atelectasis versus infiltrate. Electronically Signed   By: Lavonia Dana M.D.   On: 10/07/2021 12:22   CT HEAD CODE STROKE WO CONTRAST`  Result Date: 10/08/2021 CLINICAL DATA:  Code stroke. EXAM: CT HEAD WITHOUT CONTRAST TECHNIQUE: Contiguous axial images were obtained from the base of the skull through the vertex without intravenous contrast. RADIATION DOSE REDUCTION: This exam was performed according to the departmental dose-optimization program which includes  automated exposure control, adjustment of the mA and/or kV according to patient size and/or use of iterative reconstruction technique. COMPARISON:  05/06/2021 FINDINGS: Brain: No evidence of acute infarction, hemorrhage, hydrocephalus, extra-axial collection or mass lesion/mass effect. Advanced chronic small vessel ischemia with confluent gliosis and multiple small vessel infarcts including in the right thalamus and right basal ganglia. 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 Small chronic right cerebellar infarct. Premature brain atrophy which is generalized Vascular: No hyperdense vessel or unexpected calcification. Skull: Normal. Negative for fracture or focal lesion. Sinuses/Orbits: No acute finding. Other: Attempted text communication to [employer] line in amion at 7:04 am on 10/08/2021. ASPECTS [facility] Stroke Program Early CT Score) Not scored without localizing symptom. IMPRESSION: 1. No acute finding 2. Advanced chronic ischemic injury and brain atrophy. Electronically Signed   By: Jorje Guild M.D.   On: 10/08/2021 07:04     Assessment: 71 year old male with a history of ALS x 3 years, stroke with left-hemipelgia, bedbound at baseline, dementia and complex partial seizures who presented to the [facility] ED with concern for upper GIB. At [place] today he had acute change in responsiveness and Teleneurology was consulted. Seizure was suspected given right leg twitching and hiccuping. He had been continued on his home Keppra dose of 500 mg BID while in the ED. Ceribell EEG band was placed which did not show definite electrographic seizure activity at the resolution of the device. Twitching movements continued. Status epilepticus was suspected and the patient was emergently transferred to [facility] for LTM EEG and follow up neurological assessment.   1. On Keppra 500 mg IV BID in the ED, which was continued from his home dosage regimen.  2. Ceribell EEG without electrographic seizures,  but this modality is low resolution and will need full lead placement with LTM EEG to further assess. The Ceribell tracings reveal intermittent slow in the left fronto-temporal region on a background of continuous generalized slowing. RN reported right leg twitching during the study without concomitant eeg change. 3.  CT head: No acute finding. Advanced chronic ischemic injury and brain atrophy 4. Exam reveals no clinical seizure activity. DDx includes twitching due to metabolic encephalopathy, versus twitching due to seizure activity resolved with Ativan administration. He certainly is at risk for status epilepticus given his history of seizures. His AKI on the other hand would predispose to asterixis or tremor due to metabolic encephalopathy.   Recommendations: 1. STAT LTM EEG (ordered) 2. Continue Keppra at 500 mg BID. Patient with CrCl of 26.3; he is currently at maximum dose of Keppra given his AKI.  3. Cannot obtain MRI due to pacemaker. 4. Cannot obtain CTA due to AKI 5. May need addition  of another anticonvulsant pending STAT LTM EEG 6. Wife has decided to have patient classified as DNR regarding code status.    55 minutes spent in the emergent neurological evaluation and management of this critically ill patient.   Addendum: - LTM EEG: This is an abnormal EEG, the presence of generalized slowing is consistent with encephalopathy.  - No change to current plan. Continue Keppra.   Electronically signed: Dr. Kerney Elbe 10/08/2021, 10:36 AM

## 2021-10-08 NOTE — Plan of Care (Signed)
°  Problem: Education: Goal: Knowledge of General Education information will improve Description: Including pain rating scale, medication(s)/side effects and non-pharmacologic comfort measures 10/08/2021 1925 by Vesta Mixer, RN Outcome: Progressing 10/08/2021 1518 by Vesta Mixer, RN Outcome: Progressing   Problem: Activity: Goal: Risk for activity intolerance will decrease 10/08/2021 1925 by Vesta Mixer, RN Outcome: Progressing 10/08/2021 1518 by Vesta Mixer, RN Outcome: Progressing   Problem: Pain Managment: Goal: General experience of comfort will improve 10/08/2021 1925 by Vesta Mixer, RN Outcome: Progressing 10/08/2021 1518 by Vesta Mixer, RN Outcome: Progressing   Problem: Safety: Goal: Ability to remain free from injury will improve 10/08/2021 1925 by Vesta Mixer, RN Outcome: Progressing 10/08/2021 1518 by Vesta Mixer, RN Outcome: Progressing   Problem: Skin Integrity: Goal: Risk for impaired skin integrity will decrease 10/08/2021 1925 by Vesta Mixer, RN Outcome: Progressing 10/08/2021 1518 by Vesta Mixer, RN Outcome: Progressing

## 2021-10-08 NOTE — Progress Notes (Signed)
PROGRESS NOTE    Darrell [name]  VQQ:[id number] DOB: 01-Jun-1951 DOA: 10/07/2021 PCP: [name], MD   Brief Narrative:  Darrell [name] is a 71 y.o. male with medical history significant of osteoarthritis, bradycardia, stage III CKD dysphagia, GERD, glaucoma, hyperlipidemia history of other nonhemorrhagic CVA, hypertension, inguinal hernia, liver hemangioma muscular disorder, nuclear sclerosis of both eyes.  Primary open-angle glaucoma, right renal mass tobacco dependence who is coming to the emergency department due to coffee-ground emesis since this morning.  In the ED patient was noted to have elevated creatinine uremia hyperkalemia with mild anemia.  GI was consulted for concern over GI bleed but given evaluation history they were less concerned over single episode of one-time hematemesis versus streak of bright red blood with coughing.  Octreotide was initiated in the ED.  Rapid response this morning at shift change concerning for mental status changes initially concern for stroke, code stroke called, given questionable GI bleed patient was not placed on TPA -more concerning was patient's known history of seizure, after multiple doses of Ativan patient's mental status did not improve, neurology was consulted and given concern for status epilepticus patient was emergently transferred to the ED at [facility] for further evaluation and treatment.  During this timeframe patient had stable vitals without any hypoxia was protecting his airway and despite being nonresponsive was otherwise resting comfortably.  He did occasionally continue to twitch while on Cerebell device here given concern for ongoing subclinical seizures.  Assessment & Plan:  Acute metabolic encephalopathy, concern for status epilepticus versus acute CVA -Likely multifactorial in the setting of uremia, questionable stroke versus status epilepticus -Patient remains somnolent, unresponsive but maintaining airway satting 98% on room  air upon evaluation -Teleneurology was consulted for stroke, given questionable GI bleed tPA was not indicated, unfortunately cannot have MRI due to pacemaker nor CTA due to elevated creatinine as below. -Given concern for ongoing seizure cerebell device was placed per neurology team, given ongoing mental status depression, twitching and no improvement with Ativan x2 patient was emergently transferred to ED at [facility] for emergent bedside EEG for further evaluation and treatment with Dr. Cheral Marker. (Of note patient is on Keppra and has been compliant with this medication per discussion with wife)  AKI on CKD 3B Hyperkalemia -Likely in the setting of poor p.o. intake -Hyperkalemia protocol initiated this morning, potassium downtrending appropriately, q4h labs pending -Continue IV fluids -Uremia likely playing a role in patient's above mental status changes  Anemia, chronic, normocytic with questionable GI bleed -GI following, on octreotide -Appears to be a single event, hemoglobin remained stable and at baseline  DVT prophylaxis: None given above Code Status: Full Family Communication: Wife at bedside  Status is: Inpatient  Dispo: The patient is from: Home              Anticipated d/c is to: To be determined              Anticipated d/c date is: 72+hours              Patient currently not medically stable for discharge  Consultants:  Neurology, GI  Procedures:  EEG pending  Antimicrobials:  None indicated  Subjective: Rapid response note as above, review of systems markedly limited given patient's mental status  Objective: Vitals:   10/08/21 0400 10/08/21 0500 10/08/21 0600 10/08/21 0715  BP: 107/76 115/79 103/73   Pulse: 82 79 78 79  Resp: 18 18 16 18   Temp:  TempSrc:      SpO2: 98% 97% 99% 95%    Intake/Output Summary (Last 24 hours) at 10/08/2021 0744 Last data filed at 10/08/2021 4782 Gross per 24 hour  Intake 1278.78 ml  Output --  Net 1278.78 ml   There  were no vitals filed for this visit.  Examination:  General:  Pleasantly resting in bed, No acute distress.  Resting comfortably, nonresponsive but maintaining airway protection HEENT:  Normocephalic atraumatic.  Sclerae nonicteric, noninjected.  Extraocular movements intact bilaterally. Neck:  Without mass or deformity.  Trachea is midline. Lungs:  Clear to auscultate bilaterally without rhonchi, wheeze, or rales. Heart:  Regular rate and rhythm.  Without murmurs, rubs, or gallops. Abdomen:  Soft, nontender, nondistended.  Without guarding or rebound. Extremities: Without cyanosis, clubbing, edema, or obvious deformity. Vascular:  Dorsalis pedis and posterior tibial pulses palpable bilaterally. Skin:  Warm and dry, no erythema, no ulcerations.  Data Reviewed: I have personally reviewed following labs and imaging studies  CBC: Recent Labs  Lab 10/07/21 1124  WBC 13.4*  NEUTROABS 11.1*  HGB 10.7*  HCT 34.4*  MCV 95.3  PLT 956   Basic Metabolic Panel: Recent Labs  Lab 10/07/21 1124 10/07/21 1243 10/08/21 0515  NA 138  --  141  K 6.2* 5.7* 6.7*  CL 107  --  116*  CO2 25  --  19*  GLUCOSE 164*  --  161*  BUN 69*  --  122*  CREATININE 2.57*  --  2.94*  CALCIUM 9.6  --  9.0   GFR: CrCl cannot be calculated (Unknown ideal weight.). Liver Function Tests: Recent Labs  Lab 10/07/21 1124 10/08/21 0515  AST 14* 10*  ALT 14 11  ALKPHOS 94 58  BILITOT 0.6 0.5  PROT 6.4* 5.2*  ALBUMIN 3.4* 2.9*   Recent Labs  Lab 10/07/21 1124  LIPASE 55*   No results for input(s): AMMONIA in the last 168 hours. Coagulation Profile: No results for input(s): INR, PROTIME in the last 168 hours. Cardiac Enzymes: No results for input(s): CKTOTAL, CKMB, CKMBINDEX, TROPONINI in the last 168 hours. BNP (last 3 results) No results for input(s): PROBNP in the last 8760 hours. HbA1C: No results for input(s): HGBA1C in the last 72 hours. CBG: Recent Labs  Lab 10/08/21 0614  GLUCAP 149*    Lipid Profile: No results for input(s): CHOL, HDL, LDLCALC, TRIG, CHOLHDL, LDLDIRECT in the last 72 hours. Thyroid Function Tests: No results for input(s): TSH, T4TOTAL, FREET4, T3FREE, THYROIDAB in the last 72 hours. Anemia Panel: No results for input(s): VITAMINB12, FOLATE, FERRITIN, TIBC, IRON, RETICCTPCT in the last 72 hours. Sepsis Labs: No results for input(s): PROCALCITON, LATICACIDVEN in the last 168 hours.  Recent Results (from the past 240 hour(s))  Resp Panel by RT-PCR (Flu A&B, Covid) Nasopharyngeal Swab     Status: None   Collection Time: 10/07/21 11:25 AM   Specimen: Nasopharyngeal Swab; Nasopharyngeal(NP) swabs in vial transport medium  Result Value Ref Range Status   SARS Coronavirus 2 by RT PCR NEGATIVE NEGATIVE Final    Comment: (NOTE) SARS-CoV-2 target nucleic acids are NOT DETECTED.  The SARS-CoV-2 RNA is generally detectable in upper respiratory specimens during the acute phase of infection. The lowest concentration of SARS-CoV-2 viral copies this assay can detect is 138 copies/mL. A negative result does not preclude SARS-Cov-2 infection and should not be used as the sole basis for treatment or other patient management decisions. A negative result may occur with  improper specimen collection/handling, submission of  specimen other than nasopharyngeal swab, presence of viral mutation(s) within the areas targeted by this assay, and inadequate number of viral copies(<138 copies/mL). A negative result must be combined with clinical observations, patient history, and epidemiological information. The expected result is Negative.  Fact Sheet for Patients:  EntrepreneurPulse.com.au  Fact Sheet for Healthcare Providers:  IncredibleEmployment.be  This test is no t yet approved or cleared by the [place] FDA and  has been authorized for detection and/or diagnosis of SARS-CoV-2 by FDA under an Emergency Use Authorization  (EUA). This EUA will remain  in effect (meaning this test can be used) for the duration of the COVID-19 declaration under Section 564(b)(1) of the Act, 21 U.S.C.section 360bbb-3(b)(1), unless the authorization is terminated  or revoked sooner.       Influenza A by PCR NEGATIVE NEGATIVE Final   Influenza B by PCR NEGATIVE NEGATIVE Final    Comment: (NOTE) The Xpert Xpress SARS-CoV-2/FLU/RSV plus assay is intended as an aid in the diagnosis of influenza from Nasopharyngeal swab specimens and should not be used as a sole basis for treatment. Nasal washings and aspirates are unacceptable for Xpert Xpress SARS-CoV-2/FLU/RSV testing.  Fact Sheet for Patients: EntrepreneurPulse.com.au  Fact Sheet for Healthcare Providers: IncredibleEmployment.be  This test is not yet approved or cleared by the [place] FDA and has been authorized for detection and/or diagnosis of SARS-CoV-2 by FDA under an Emergency Use Authorization (EUA). This EUA will remain in effect (meaning this test can be used) for the duration of the COVID-19 declaration under Section 564(b)(1) of the Act, 21 U.S.C. section 360bbb-3(b)(1), unless the authorization is terminated or revoked.  Performed at [facility], [place] [address] [place], [place] [address]          Radiology Studies: CT ABDOMEN PELVIS WO CONTRAST  Result Date: 10/07/2021 CLINICAL DATA:  Hematemesis.  Denies abdominal pain. EXAM: CT ABDOMEN AND PELVIS WITHOUT CONTRAST TECHNIQUE: Multidetector CT imaging of the abdomen and pelvis was performed following the standard protocol without IV contrast. COMPARISON:  CT abdomen and pelvis dated May 05, 2019. FINDINGS: Lower chest: No acute abnormality. Scarring and atelectasis in the lingula and left-greater-than-right lower lobes. Hepatobiliary: Unchanged 2.5 cm low-density lesion in the anterior left hepatic lobe, previously characterized as a  hemangioma. No new focal liver abnormality. The gallbladder is unremarkable. No biliary dilatation. Pancreas: Unremarkable. No pancreatic ductal dilatation or surrounding inflammatory changes. Spleen: Normal in size without focal abnormality. Adrenals/Urinary Tract: Adrenal glands are unremarkable. Enlarging now 2.6 cm indeterminate right renal anterior upper pole lesion, slowly increasing in size since 2017. Additional bilateral simple and complex renal cysts are not significantly changed. No renal calculi or hydronephrosis. Bladder is unremarkable. Stomach/Bowel: Distended distal esophagus and stomach. No bowel wall thickening, distention, or surrounding inflammatory changes. Large rectosigmoid stool ball. Normal appendix. Vascular/Lymphatic: Aortic atherosclerosis. No enlarged abdominal or pelvic lymph nodes. Reproductive: Prostate is unremarkable. Other: No free fluid or pneumoperitoneum. Musculoskeletal: No acute or significant osseous findings. IMPRESSION: 1. Distended distal esophagus and stomach without obstruction. 2. Large rectosigmoid stool ball. Correlate for fecal impaction. 3. Enlarging now 2.6 cm indeterminate right renal anterior upper pole lesion, slowly increasing in size since 2017. Renal cell carcinoma is not excluded. Recommend non-emergent outpatient renal protocol MRI with and without contrast for further evaluation. 4. Aortic Atherosclerosis (ICD10-I70.0). Electronically Signed   By: Titus Dubin M.D.   On: 10/07/2021 13:24   DG Abdomen 1 View  Result Date: 10/07/2021 CLINICAL DATA:  Coffee-ground emesis EXAM: ABDOMEN -  1 VIEW COMPARISON:  Portable exam 1205 hours compared to CT abdomen and pelvis 08/11/2016 FINDINGS: Increased stool in rectum. Gas and normal stool throughout remainder of colon. Gaseous distention of stomach. Small bowel gas pattern normal. No bowel dilatation or wall thickening otherwise identified. Bones demineralized. IMPRESSION: Increased stool in rectum  Electronically Signed   By: Lavonia Dana M.D.   On: 10/07/2021 12:24   DG Chest Portable 1 View  Result Date: 10/07/2021 CLINICAL DATA:  Vomiting, coffee-ground emesis, increased weakness EXAM: PORTABLE CHEST 1 VIEW COMPARISON:  Portable exam 1203 hours compared to 05/23/2021 FINDINGS: LEFT subclavian sequential transvenous pacemaker leads project at RIGHT atrium and RIGHT ventricle. Normal heart size, mediastinal contours, and pulmonary vascularity. Atherosclerotic calcification aorta. Rotated to the LEFT. Mild bibasilar atelectasis versus infiltrate. Remaining lungs clear. No pleural effusion or pneumothorax. Bones demineralized. IMPRESSION: Bibasilar atelectasis versus infiltrate. Electronically Signed   By: Lavonia Dana M.D.   On: 10/07/2021 12:22   CT HEAD CODE STROKE WO CONTRAST`  Result Date: 10/08/2021 CLINICAL DATA:  Code stroke. EXAM: CT HEAD WITHOUT CONTRAST TECHNIQUE: Contiguous axial images were obtained from the base of the skull through the vertex without intravenous contrast. RADIATION DOSE REDUCTION: This exam was performed according to the departmental dose-optimization program which includes automated exposure control, adjustment of the mA and/or kV according to patient size and/or use of iterative reconstruction technique. COMPARISON:  05/06/2021 FINDINGS: Brain: No evidence of acute infarction, hemorrhage, hydrocephalus, extra-axial collection or mass lesion/mass effect. Advanced chronic small vessel ischemia with confluent gliosis and multiple small vessel infarcts including in the right thalamus and right basal ganglia. 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 Small chronic right cerebellar infarct. Premature brain atrophy which is generalized Vascular: No hyperdense vessel or unexpected calcification. Skull: Normal. Negative for fracture or focal lesion. Sinuses/Orbits: No acute finding. Other: Attempted text communication to [employer] line in amion at  7:04 am on 10/08/2021. ASPECTS [facility] Stroke Program Early CT Score) Not scored without localizing symptom. IMPRESSION: 1. No acute finding 2. Advanced chronic ischemic injury and brain atrophy. Electronically Signed   By: Jorje Guild M.D.   On: 10/08/2021 07:04        Scheduled Meds:  amLODipine  5 mg Oral Daily   atorvastatin  40 mg Oral Daily   brimonidine  1 drop Both Eyes Daily   insulin aspart  10 Units Intravenous Once   And   dextrose  1 ampule Intravenous Once   dextrose       dorzolamide-timolol  1 drop Both Eyes Daily   furosemide  40 mg Intravenous Once   glycopyrrolate  1 mg Oral BID   hydrOXYzine  25 mg Oral QHS   latanoprost  1 drop Both Eyes QHS   levETIRAcetam  500 mg Oral BID   metoprolol tartrate  50 mg Oral BID   mometasone-formoterol  2 puff Inhalation BID   octreotide  25 mcg Intravenous Once   ondansetron (ZOFRAN) IV  4 mg Intravenous Once   pantoprazole  40 mg Intravenous Q12H   sodium zirconium cyclosilicate  10 g Oral Once   sorbitol, milk of mag, mineral oil, glycerin (SMOG) enema  400 mL Rectal Once   tamsulosin  0.4 mg Oral Daily   umeclidinium bromide  1 puff Inhalation Daily   Continuous Infusions:  sodium chloride Stopped (10/07/21 1831)   azithromycin Stopped (10/07/21 1608)   octreotide  (SANDOSTATIN)    IV infusion       LOS: 0  days   Time spent: 26min  Markel Mergenthaler C Rachid Parham, DO [facility]  If 7PM-7AM, please contact night-coverage [email or web address]  10/08/2021, 7:44 AM

## 2021-10-08 NOTE — Consult Note (Addendum)
NAME:  ANDIE MORTIMER, MRN:  [id number], DOB:  [birth year], LOS: 0 ADMISSION DATE:  10/07/2021, CONSULTATION DATE:  10/08/21 REFERRING MD:  Dani Gobble, CHIEF COMPLAINT:  Encephalopathy   History of Present Illness:  71 y.o.-year-old man with past medical history notable for ALS largely wheelchair-bound presented to the emergency room with what was presumed to be sepsis from pneumonia.  Acute mental changes a.m. 1/14.  CT code stroke was negative for bleed.  Additional imaging, MRI not available due to pacemaker, and CTA head not available due to worsening renal function.  Labs notable for hyperkalemia with mild improvement after shifting earlier in the day.  He was given 2 doses of Ativan without improvement in his mental status.  There was some concern for status epilepticus given his history of seizure disorder on Keppra at home.  However, as stated this did not improve his mental status, cerebell was placed with did not show any evidence of seizure status.  He was transferred to [facility] for further evaluation.  On arrival, concern for his encephalopathy.  PCCM was called to bedside to urgently evaluate.  Upon arrival he is lying in chair with nonrebreather.  Satting under percent.  Wife in room.  Opens eyes to wife voice, responds to pain.  Discussed condition with wife.  She states he would not want to be on life support.  I counseled her that would recommend DNR given his progressive ALS and likelihood of intubation being terminal event versus significant worsening and already poor perceived quality of life.  She agreed to DNR.  Pertinent  Medical History  ALS, seizure disorder on [facility] Events: Including procedures, antibiotic start and stop dates in addition to other pertinent events   1/13 admitted 1/14 worsening mental status prompting transfer to [facility]  Interim History / Subjective:  As above  Objective   Blood pressure 118/73, pulse 76, temperature 98.6 F (37  C), temperature source Oral, resp. rate 16, weight 78.4 kg, SpO2 97 %.        Intake/Output Summary (Last 24 hours) at 10/08/2021 1239 Last data filed at 10/08/2021 0623 Gross per 24 hour  Intake 1573.69 ml  Output 900 ml  Net 673.69 ml   Filed Weights   10/07/21 2000  Weight: 78.4 kg    Examination: General: Chronically ill-appearing lying in stretcher HENT: No icterus, dry mucous membranes Lungs: Normal work of breathing, some pauses in breathing, on nonrebreather Cardiovascular: Regular rate and rhythm Abdomen: Nondistended Extremities: Decreased bulk, warm Neuro: Opens eyes to light voice, responds to pain, does not follow commands   Resolved Hospital Problem list   N/a  Assessment & Plan:  Toxic metabolic encephalopathy: Likely developing or worsening severe sepsis.  EEG was negative for seizure.  Seizures or status is still possible but seems less likely now.  Also, got 4 mg of Ativan earlier today which could be prolonging or worsening encephalopathy. --Avoid centrally acting medications at this time --Discussed with neurology consultant, agree with long-term video EEG for further evaluation of seizure --Ideally would evaluate for intracranial abnormality with advanced imaging but cannot do the limitations discussed in HPI --Aggressive treatment of pneumonia or other sources of infection as likely this represents encephalopathy due to sepsis   Goals of care: Discussed at length with wife at bedside.  Agreed to DNR.  CODE STATUS changed.  PCCM will sign off  Best Practice (right click and "Reselect all SmartList Selections" daily)   Per primary Labs  CBC: Recent Labs  Lab 10/07/21 1124  WBC 13.4*  NEUTROABS 11.1*  HGB 10.7*  HCT 34.4*  MCV 95.3  PLT 798    Basic Metabolic Panel: Recent Labs  Lab 10/07/21 1124 10/07/21 1243 10/08/21 0515 10/08/21 0858  NA 138  --  141  --   K 6.2* 5.7* 6.7* 6.1*  CL 107  --  116*  --   CO2 25  --  19*  --    GLUCOSE 164*  --  161*  --   BUN 69*  --  122*  --   CREATININE 2.57*  --  2.94*  --   CALCIUM 9.6  --  9.0  --    GFR: Estimated Creatinine Clearance: 26.3 mL/min (A) (by C-G formula based on SCr of 2.94 mg/dL (H)). Recent Labs  Lab 10/07/21 1124  WBC 13.4*    Liver Function Tests: Recent Labs  Lab 10/07/21 1124 10/08/21 0515  AST 14* 10*  ALT 14 11  ALKPHOS 94 58  BILITOT 0.6 0.5  PROT 6.4* 5.2*  ALBUMIN 3.4* 2.9*   Recent Labs  Lab 10/07/21 1124  LIPASE 55*   No results for input(s): AMMONIA in the last 168 hours.  ABG    Component Value Date/Time   TCO2 25 03/11/2013 1705     Coagulation Profile: No results for input(s): INR, PROTIME in the last 168 hours.  Cardiac Enzymes: No results for input(s): CKTOTAL, CKMB, CKMBINDEX, TROPONINI in the last 168 hours.  HbA1C: Hemoglobin A1C  Date/Time Value Ref Range Status  10/07/2013 02:20 PM 5.3  Final   HbA1c, POC (prediabetic range)  Date/Time Value Ref Range Status  09/12/2018 09:45 AM 6.2 5.7 - 6.4 % Final   HbA1c, POC (controlled diabetic range)  Date/Time Value Ref Range Status  08/20/2019 03:32 PM 5.1 0.0 - 7.0 % Final    CBG: Recent Labs  Lab 10/08/21 0614 10/08/21 0821  GLUCAP 149* 206*    Review of Systems:   Unable to obtain due to encephalopathy  Past Medical History:  He,  has a past medical history of Arthritis, At high risk for falls (08/16/2015), Bradycardia, Chronic kidney disease, CIDP (chronic inflammatory demyelinating polyneuropathy) ([place]), CKD (chronic kidney disease) stage 3, GFR 30-59 ml/min ([name]) (08/16/2015), Dysphagia as late effect of cerebrovascular disease, Elevated liver enzymes (08/10/2016), GERD (gastroesophageal reflux disease), Glaucoma, High cholesterol, History of CVA with residual deficit (03/25/2013), Hypertension, Hypertensive retinopathy of both eyes (01/16/2017), Inguinal hernia (03/25/2013), Liver hemangioma (08/14/2016), Neuromuscular disorder ([place]), New onset  seizure ([place]) (07/08/2017), Nuclear sclerosis of both eyes (10/25/2016), Presence of permanent cardiac pacemaker, Primary open angle glaucoma of both eyes, indeterminate stage (10/25/2016), Renal mass, right (08/14/2016), Status cardiac pacemaker (01/29/2017), Stroke ([place]), and Tobacco dependence.   Surgical History:   Past Surgical History:  Procedure Laterality Date   EYE SURGERY     HERNIA REPAIR     INGUINAL HERNIA REPAIR Right 11/23/2014   Procedure: right inguinal hernia repair with mesh;  Surgeon: Armandina Gemma, MD;  Location: [facility];  Service: General;  Laterality: Right;   INSERTION OF MESH N/A 11/23/2014   Procedure: INSERTION OF MESH;  Surgeon: Armandina Gemma, MD;  [facility]: [facility];  Service: General;  Laterality: N/A;   MASS EXCISION Left 08/29/2017   Procedure: EXCISION OF LEFT NECK MASS;  Surgeon: Coralie Keens, MD;  Location: [place];  Service: General;  Laterality: Left;   PACEMAKER IMPLANT N/A 01/16/2017   Procedure: Pacemaker Implant;  Surgeon: Will Meredith Leeds, MD;  Location: [place] [facility];  Service: Cardiovascular;  Laterality: N/A;   SHOULDER SURGERY Bilateral 1988, 1998     Social History:   reports that he has been smoking cigarettes. He has a 40.00 pack-year smoking history. He has never used smokeless tobacco. He reports that he does not drink alcohol and does not use drugs.   Family History:  His family history includes COPD in his sister; Cancer in his sister; Diabetes in his sister; Hypertension in his mother and sister. There is no history of Stomach cancer, Colon cancer, Pancreatic cancer, or Esophageal cancer.   Allergies Allergies  Allergen Reactions   Ace Inhibitors Other (See Comments)    Hyperkalemia   Doxycycline Other (See Comments)    Hiccups, cough, nausea and emesis, elevated liver enzymes, elevated eosinophils, SOB concerning for early DRESS syndrome    Atacand Hct [Candesartan Cilexetil-Hctz] Hives   Shellfish Allergy Hives     Home  Medications  Prior to Admission medications   Medication Sig Start Date End Date Taking? Authorizing Provider  albuterol (VENTOLIN HFA) 108 (90 Base) MCG/ACT inhaler Inhale 2 puffs into the lungs every 6 (six) hours as needed for up to 30 days for Wheezing. Patient taking differently: Inhale 2 puffs into the lungs as needed for wheezing or shortness of breath. 06/08/21  Yes Freeman Caldron M, PA-C  amLODipine (NORVASC) 5 MG tablet Take 1 tablet (5 mg total) by mouth daily. 06/08/21  Yes Argentina Donovan, PA-C  aspirin EC 81 MG tablet Take 1 tablet (81 mg total) by mouth daily. 02/20/17  Yes Funches, Josalyn, MD  atorvastatin (LIPITOR) 40 MG tablet Take 1 tablet (40 mg total) by mouth daily. 06/08/21  Yes McClung, Dionne Bucy, PA-C  azithromycin (ZITHROMAX) 500 MG tablet Take 500 mg by mouth 3 (three) times a week. Mon, wed, & fri. 08/15/21  Yes [provider]  brimonidine (ALPHAGAN) 0.2 % ophthalmic solution Place 1 drop into both eyes 3 times daily. 07/25/21  Yes [provider]  Budeson-Glycopyrrol-Formoterol (BREZTRI AEROSPHERE) 160-9-4.8 MCG/ACT AERO Inhale 2 puffs into the lungs 2 (two) times daily. Patient taking differently: Inhale 2 puffs into the lungs as needed (SOB, wheezing). 08/01/21  Yes [name], MD  cetirizine (ZYRTEC) 10 MG tablet Take 1 tablet (10 mg total) by mouth daily. 06/08/21  Yes McClung, Angela M, PA-C  dicyclomine (BENTYL) 10 MG capsule TAKE 1 CAPSULE BY MOUTH IN THE MORNING AND AT BEDTIME Patient taking differently: Take 10 mg by mouth daily. 08/01/21  Yes [name], MD  ferrous sulfate (FEROSUL) 325 (65 FE) MG tablet Take 1 tablet (325 mg total) by mouth 2 (two) times daily with a meal. 06/08/21  Yes McClung, Angela M, PA-C  fluticasone (FLONASE) 50 MCG/ACT nasal spray Place 2 sprays into both nostrils daily. 01/12/21  Yes Argentina Donovan, PA-C  glycopyrrolate (ROBINUL) 1 MG tablet Take 1 tablet (1 mg total) by mouth 2 times daily. 08/01/21  Yes  Newlin, Charlane Ferretti, MD  guaiFENesin-dextromethorphan (ROBITUSSIN DM) 100-10 MG/5ML syrup Take 5 mLs by mouth every 4 (four) hours as needed for cough. 05/07/21  Yes Shelly Coss, MD  hydrOXYzine (ATARAX/VISTARIL) 25 MG tablet Take 1 tablet (25 mg total) by mouth at bedtime. 08/02/20  Yes [name], MD  levETIRAcetam (KEPPRA) 500 MG tablet Take 1 tablet (500 mg total) by mouth 2 (two) times daily. 12/13/20  Yes Penumalli, Vikram R, MD  lidocaine (LIDODERM) 5 % Apply patch to painful area. Patch may remain in place for up  to 12 hours in a 24 hour period. 06/08/21  Yes Argentina Donovan, PA-C  metoprolol tartrate (LOPRESSOR) 50 MG tablet Take 1 tablet (50 mg total) by mouth 2 (two) times daily. 06/08/21  Yes McClung, Angela M, PA-C  montelukast (SINGULAIR) 10 MG tablet Take 10 mg by mouth at bedtime. 08/29/21  Yes [provider]  Multiple Vitamin (MULTI-VITAMIN) tablet Take by mouth.   Yes [provider]  tamsulosin (FLOMAX) 0.4 MG CAPS capsule Take 1 capsule (0.4 mg total) by mouth daily. 08/01/21  Yes [name], MD  torsemide (DEMADEX) 20 MG tablet Take 20 mg by mouth as needed (swelling). 09/15/21  Yes [provider]  brimonidine (ALPHAGAN) 0.2 % ophthalmic solution 1 drop 3 (three) times daily. Patient not taking: Reported on 10/07/2021 07/25/21   [provider]  dorzolamide-timolol (COSOPT) 22.3-6.8 MG/ML ophthalmic solution Place 1 drop into both eyes daily.     [provider]  latanoprost (XALATAN) 0.005 % ophthalmic solution Place 1 drop into both eyes nightly. 07/25/21   [provider]  meloxicam (MOBIC) 15 MG tablet Take 1 tablet by mouth once daily Patient not taking: Reported on 10/08/2021 04/06/21   Edrick Kins, DPM  Misc. Devices MISC Yankauer suction. Dx: ALS 11/11/19   [name], MD  Misc. Devices MISC Coughalator 04/01/20   [name], MD  Misc. Devices MISC Nebulizer machine.  Diagnosis- Motor neuron disease  04/01/20   [name], MD  Misc. [place] [facility] Bed [id number] 06/01/20   [name], MD  Misc. [place] [facility] bed extension. Diagnosis: ALS. Height 6'4" 06/10/20   [name], MD  Misc. Devices MISC Manual wheelchair.  Diagnosis-ALS 05/04/21   [name], MD     Critical care time: n/a

## 2021-10-08 NOTE — Plan of Care (Signed)
Discussed with patient plan of care for the evening, pain management and suctioning with some teach back from patient and wife.   Problem: Education: Goal: Knowledge of General Education information will improve Description: Including pain rating scale, medication(s)/side effects and non-pharmacologic comfort measures 10/08/2021 1025 by Jannette Fogo, RN Outcome: Progressing 10/08/2021 0922 by Jannette Fogo, RN Outcome: Progressing   Problem: Pain Managment: Goal: General experience of comfort will improve Outcome: Progressing

## 2021-10-08 NOTE — Consult Note (Signed)
Renal Service Consult Note [facility]  Darrell [name] 10/08/2021 [name], MD Requesting Physician: Dr. Lupita Leash  Reason for Consult: Renal failure HPI: The patient is a 71 y.o. year-old w/ hx of CKD 3b, ALS mostly bedbound,  HL, hx CVA, HTN, seizure d/o, sp PPM presented to ED on 1/13 w/ report of CG emesis. Pt lives at home, bedbound w/ multiple neurologic comorbidities. In ED wbc 13k, Hb 10.7, creat 2.5, K 6.2. CT abdomen showed constipation and rectal impaction. CXR showed atx vs aspiration. EKG showed paced rhythm. Pt was admitted w/ dx of probable PNA, vomiting and rectal impaction.  Seen by GI who did not suspect acute GIB.  Creat 2.5 on admit, then up to 2.9 this morning and 3.47 this afternoon.  Pt w/ AMS. Asked to see for renal failure.   Pt seen in room. Wife and dtr at bedside. Pt unable to answer any questions.   ROS - n/a   Past Medical History  Past Medical History:  Diagnosis Date   Arthritis    At high risk for falls 08/16/2015   Bradycardia    Chronic kidney disease    stage 3 GFR 30-59 ml/min    CIDP (chronic inflammatory demyelinating polyneuropathy) ([name])    CKD (chronic kidney disease) stage 3, GFR 30-59 ml/min ([name]) 08/16/2015   Dysphagia as late effect of cerebrovascular disease    pts wife states pt has to eat soft foods    Elevated liver enzymes 08/10/2016   GERD (gastroesophageal reflux disease)    Glaucoma    High cholesterol    History of CVA with residual deficit 03/25/2013   Hypertension    Hypertensive retinopathy of both eyes 01/16/2017   Inguinal hernia 03/25/2013   Liver hemangioma 08/14/2016   Neuromuscular disorder ([place])    chronic inflammatory demyelinating polyneuropathy    New onset seizure ([place]) 07/08/2017   seizure 07/14/18   Nuclear sclerosis of both eyes 10/25/2016   Presence of permanent cardiac pacemaker    placed in april 2018   Primary open angle glaucoma of both eyes, indeterminate stage 10/25/2016   Renal mass,  right 08/14/2016   Status cardiac pacemaker 01/29/2017   Placed for second degree heart block on 01/16/17 Medtronic Azure XT DR MRI SureScan dual-chamber pacemaker   Stroke [facility])    2011 with residual deficit left sided weakness   Tobacco dependence    Past Surgical History  Past Surgical History:  Procedure Laterality Date   EYE SURGERY     HERNIA REPAIR     INGUINAL HERNIA REPAIR Right 11/23/2014   Procedure: right inguinal hernia repair with mesh;  Surgeon: Armandina Gemma, MD;  [facility];  Service: General;  Laterality: Right;   INSERTION OF MESH N/A 11/23/2014   Procedure: INSERTION OF MESH;  Surgeon: Armandina Gemma, MD;  [facility];  Service: General;  Laterality: N/A;   MASS EXCISION Left 08/29/2017   Procedure: EXCISION OF LEFT NECK MASS;  Surgeon: Coralie Keens, MD;  Location: [place];  Service: General;  Laterality: Left;   PACEMAKER IMPLANT N/A 01/16/2017   Procedure: Pacemaker Implant;  Surgeon: Will Meredith Leeds, MD;  Location: [place] [facility];  Service: Cardiovascular;  Laterality: N/A;   SHOULDER SURGERY Bilateral 1988, 1998   Family History  Family History  Problem Relation Age of Onset   Hypertension Mother    Diabetes Sister    Hypertension Sister    Cancer Sister  1 sister   COPD Sister        in 1 sister   Stomach cancer Neg Hx    Colon cancer Neg Hx    Pancreatic cancer Neg Hx    Esophageal cancer Neg Hx    Social History  reports that he has been smoking cigarettes. He has a 40.00 pack-year smoking history. He has never used smokeless tobacco. He reports that he does not drink alcohol and does not use drugs. Allergies  Allergies  Allergen Reactions   Ace Inhibitors Other (See Comments)    Hyperkalemia   Doxycycline Other (See Comments)    Hiccups, cough, nausea and emesis, elevated liver enzymes, elevated eosinophils, SOB concerning for early DRESS syndrome    Atacand Hct [Candesartan Cilexetil-Hctz] Hives   Shellfish Allergy Hives    Home medications Prior to Admission medications   Medication Sig Start Date End Date Taking? Authorizing Provider  albuterol (VENTOLIN HFA) 108 (90 Base) MCG/ACT inhaler Inhale 2 puffs into the lungs every 6 (six) hours as needed for up to 30 days for Wheezing. Patient taking differently: Inhale 2 puffs into the lungs as needed for wheezing or shortness of breath. 06/08/21  Yes Freeman Caldron M, PA-C  amLODipine (NORVASC) 5 MG tablet Take 1 tablet (5 mg total) by mouth daily. 06/08/21  Yes Argentina Donovan, PA-C  aspirin EC 81 MG tablet Take 1 tablet (81 mg total) by mouth daily. 02/20/17  Yes Funches, Josalyn, MD  atorvastatin (LIPITOR) 40 MG tablet Take 1 tablet (40 mg total) by mouth daily. 06/08/21  Yes McClung, Dionne Bucy, PA-C  azithromycin (ZITHROMAX) 500 MG tablet Take 500 mg by mouth 3 (three) times a week. Mon, wed, & fri. 08/15/21  Yes [provider]  brimonidine (ALPHAGAN) 0.2 % ophthalmic solution Place 1 drop into both eyes 3 times daily. 07/25/21  Yes [provider]  Budeson-Glycopyrrol-Formoterol (BREZTRI AEROSPHERE) 160-9-4.8 MCG/ACT AERO Inhale 2 puffs into the lungs 2 (two) times daily. Patient taking differently: Inhale 2 puffs into the lungs as needed (SOB, wheezing). 08/01/21  Yes [name], MD  cetirizine (ZYRTEC) 10 MG tablet Take 1 tablet (10 mg total) by mouth daily. 06/08/21  Yes McClung, Angela M, PA-C  dicyclomine (BENTYL) 10 MG capsule TAKE 1 CAPSULE BY MOUTH IN THE MORNING AND AT BEDTIME Patient taking differently: Take 10 mg by mouth daily. 08/01/21  Yes [name], MD  ferrous sulfate (FEROSUL) 325 (65 FE) MG tablet Take 1 tablet (325 mg total) by mouth 2 (two) times daily with a meal. 06/08/21  Yes McClung, Angela M, PA-C  fluticasone (FLONASE) 50 MCG/ACT nasal spray Place 2 sprays into both nostrils daily. 01/12/21  Yes Argentina Donovan, PA-C  glycopyrrolate (ROBINUL) 1 MG tablet Take 1 tablet (1 mg total) by mouth 2 times daily. 08/01/21   Yes Newlin, Charlane Ferretti, MD  guaiFENesin-dextromethorphan (ROBITUSSIN DM) 100-10 MG/5ML syrup Take 5 mLs by mouth every 4 (four) hours as needed for cough. 05/07/21  Yes Shelly Coss, MD  hydrOXYzine (ATARAX/VISTARIL) 25 MG tablet Take 1 tablet (25 mg total) by mouth at bedtime. 08/02/20  Yes [name], MD  levETIRAcetam (KEPPRA) 500 MG tablet Take 1 tablet (500 mg total) by mouth 2 (two) times daily. 12/13/20  Yes Penumalli, Vikram R, MD  lidocaine (LIDODERM) 5 % Apply patch to painful area. Patch may remain in place for up to 12 hours in a 24 hour period. 06/08/21  Yes Freeman Caldron M, PA-C  metoprolol tartrate (LOPRESSOR) 50 MG tablet  Take 1 tablet (50 mg total) by mouth 2 (two) times daily. 06/08/21  Yes McClung, Angela M, PA-C  montelukast (SINGULAIR) 10 MG tablet Take 10 mg by mouth at bedtime. 08/29/21  Yes [provider]  Multiple Vitamin (MULTI-VITAMIN) tablet Take by mouth.   Yes [provider]  tamsulosin (FLOMAX) 0.4 MG CAPS capsule Take 1 capsule (0.4 mg total) by mouth daily. 08/01/21  Yes [name], MD  torsemide (DEMADEX) 20 MG tablet Take 20 mg by mouth as needed (swelling). 09/15/21  Yes [provider]  brimonidine (ALPHAGAN) 0.2 % ophthalmic solution 1 drop 3 (three) times daily. Patient not taking: Reported on 10/07/2021 07/25/21   [provider]  dorzolamide-timolol (COSOPT) 22.3-6.8 MG/ML ophthalmic solution Place 1 drop into both eyes daily.     [provider]  latanoprost (XALATAN) 0.005 % ophthalmic solution Place 1 drop into both eyes nightly. 07/25/21   [provider]  meloxicam (MOBIC) 15 MG tablet Take 1 tablet by mouth once daily Patient not taking: Reported on 10/08/2021 04/06/21   Edrick Kins, DPM  Misc. Devices MISC Yankauer suction. Dx: ALS 11/11/19   [name], MD  Misc. Devices MISC Coughalator 04/01/20   [name], MD  Misc. Devices MISC Nebulizer machine.  Diagnosis- Motor neuron disease  04/01/20   [name], MD  Misc. [place] [facility] Bed [id number] 06/01/20   [name], MD  Misc. [place] [facility] bed extension. Diagnosis: ALS. Height 6'4" 06/10/20   [name], MD  Misc. Devices MISC Manual wheelchair.  Diagnosis-ALS 05/04/21   [name], MD     Vitals:   10/08/21 1412 10/08/21 1416 10/08/21 1726 10/08/21 1930  BP:    102/60  Pulse:    93  Resp:    15  Temp:    97.9 F (36.6 C)  TempSrc:    Axillary  SpO2: 100% 100% 100% 100%  Weight:       Exam Gen sonorous breathing, very somnolent No rash, cyanosis or gangrene Sclera anicteric, throat clear  No jvd or bruits, flat neck veins Chest clear bilat to bases, no rales/ wheezing RRR no MRG Abd soft ntnd no mass or ascites +bs GU normal male w/ foley draining large amts mod amber urine MS no joint effusions or deformity Ext 1+ L hip edema, no other UE/ LE edema Neuro is somnolent    Home meds include - norvasc 5 qd, asa, lipitor, brestri aerosphere, keppra 500 bid, metoprolol 50 bid, flomax, eye gtts/ prns/ vits/ supps     Date   Creat  eGFR    2018   1.77- 2.66    2019   1.78- 2.49    2020   2.48- 4.32    2021   2.09    July- sept 2022 1.73- 2.72 26- 42 ml/min, stage IIIb     Jan 13  2.57    Jan 14  2.94, 3.47     CXR 1/13 - IMPRESSION: Bibasilar atelectasis versus infiltrate.    CXR 1/14 - IMPRESSION: Persistent streaky perihilar opacities and possible more confluent airspace consolidation in the bilateral lung bases.     CT abd noncon 1/13 - Urinary Tract: enlarging now 2.6 cm indeterminate right renal anterior upper pole lesion, slowly increasing in size since 2017. Additional bilateral simple and complex renal cysts are not significantly changed. No renal calculi or hydronephrosis. Bladder is unremarkable.     UA pend      UNa , UCr  pend    BP's here 110- 140/ 70-90, HR 70- 90  RR 15 19 temp 100.4  5L HFNC     I/o since admit = 2.7 L in and 900 cc UOP     Foley  cath just placed w/ large amount UOP   Assessment/ Plan: AKI on CKD 3b - b/l creatinine 1.73- 2.72, eGFR 26- 42 ml/min from fall 2022.  Creat 2.5 on admission yesterday and rising up to 3.47 this afternoon. Pt looks euvolemic in exam.  CT abd shows no renal obstruction. UA pending. No contrast or other nephrotoxins. Suspect AKI due to urinary retention. Foley placed this afternoon w/ very good UOP. Get UA, urine lytes. Has room for volume, will start IV bicarb at 100 cc /hr. Pt is not a dialysis candidate due to severe comorbidities and debility. Have d/w pt's family at bedside and questions answered.  Hyperkalemia - K+ 6.8, rec'd IV insulin/ glu/ Ca and 46m albuterol this afternoon. Also lokelma. F/U labs are pending this evening. Adding IVF's to ^UOP and lower K+. Renal diet (low K+) if allowed to eat.  Metabolic acidosis - starting bicarb gtt at 100 cc/hr Volume - prob euvolemic to slightly dry. CXR no edema.  HTN - BP's low normal, will dc norvasc and lower metoprolol to 25 bid PNA/ sepsis - no shock, getting IV abx AMS - hx of seizure d/o, per neuro no evidence seizures yet CIDP - per pmd Anemia - Hb 7- 7.5, transfuse prn    RKelly Splinter MD 10/08/2021, 8:41 PM  Recent Labs  Lab 10/07/21 1124 10/08/21 1449  WBC 13.4* 10.9*  HGB 10.7* 7.4*   Recent Labs  Lab 10/08/21 0515 10/08/21 0858 10/08/21 1449  K 6.7* 6.1* 6.8*  BUN 122*  --  130*  CREATININE 2.94*  --  3.47*  CALCIUM 9.0  --  9.1

## 2021-10-08 NOTE — Progress Notes (Signed)
Rapid EEG Nursing Documentation:  EEG Headband in place? New Headband Applied  Pre-EEG skin integrity: Intact  Time EEG Headband placed: [id number]  Intra & Post EEG Skin Integrity: Intact  Removed d/t [facility] arrival to transfer [facility] to [facility] ED

## 2021-10-08 NOTE — ED Notes (Addendum)
[facility] arrived to ED to transport pt. Pt's wife informed of pt's transfer. Wife states understanding of transfer. ICU Charge RN Zoe to ed to to remove portable EEG.

## 2021-10-08 NOTE — Procedures (Signed)
[facility] Tele[facility]urology Consult Services STAT LTM Read: 10/08/2021 15:37 - 10/08/2021 18:41   Patient Name:   Darrell [name] Date of Birth:   Aug 07, 1951 Identification Number:   MRN - [id number]  Indication: Encephalopathy,  Technical Summary: A routine 20 channel electroencephalogram using the international 10-20 system of electrode placement was performed.  Background: 5-6 Hz, Poorly formed  States       Awake  Abnormalities  Generalized Slowing: Diffuse generalized slowing Background Slowing: The background consists of 20-50 uV, 5-6 Hz diffuse activity with superimposed diffuse polymorphic delta activity that is non reactive to external stimulation.   Activation Procedures  Hyperventilation: Not performed  Photic Stimulation:  Classification: Abnormal :  Diagnosis: This is abnormal EEG, the Presence of Generalized slowing is consistent with Encephalopathy      Dr Tsosie Billing   [facility] [phone]  Case [id number]

## 2021-10-08 NOTE — Plan of Care (Signed)
Discussed with patient and wife plan of care for the evening, pain management and taking bedtime medications with some teach back displayed.  Problem: Education: Goal: Knowledge of General Education information will improve Description: Including pain rating scale, medication(s)/side effects and non-pharmacologic comfort measures Outcome: Progressing   Problem: Pain Managment: Goal: General experience of comfort will improve Outcome: Progressing

## 2021-10-08 NOTE — ED Notes (Signed)
[facility] transport called for report on pt.  Pt to be transferred to [facility] for further Neuro Eval treatment.

## 2021-10-08 NOTE — Progress Notes (Signed)
EEG done at bedside. No skin breakdown noted. Results pending. 

## 2021-10-08 NOTE — Progress Notes (Signed)
Patient's wife came out room stating something wrong with her husband.  Upon entering he was snoring.  Patient was unresponsive to voice and pain.  Patient did open eyes for a few seconds after suctioning.  Patient has history of stroke and seizures so, MD immediately paged and code stroke activated.  CBG check was 146.  Patient labs morning 0500 labs were called to process STAT.  MD on-call paged and STAT CT was performed.    Wife in room updated during the whole process and Tele Neuro done upon returning to room.  Patient last known well at 85.  Patient occasionally twitching right leg.  His right arm which had limited movement is now flaccid and bilateral absent grips.  Protonix and Sandostatin ordered by night MD.  Ativan and portable EEG ordered from Neuro Tele.    Patients wife questions answered at this time about what new length of stay maybe increased due to lethargy due possible to critical K+ 6.7.from 5.7 previously.  AM RN executing orders.

## 2021-10-08 NOTE — Progress Notes (Signed)
Patient took medication whole in applesauce with water in between pills.

## 2021-10-08 NOTE — Progress Notes (Signed)
Patient had hiccups; wife requested and MD paged

## 2021-10-08 NOTE — ED Notes (Signed)
Pt failed swallow screen, MD [place] notified, PO am meds to be held.

## 2021-10-08 NOTE — Progress Notes (Signed)
Found wife a recliner, foof and warm blankets

## 2021-10-08 NOTE — Procedures (Signed)
Patient Name: Darrell [name]  MRN: [id number]  Epilepsy Attending: Lora Havens  Referring Physician/Provider: Ida Rogue, MD Duration: 10/08/2021 0749 to 1051  Patient history: 71yo M with ams and right foot twitching. Rapid eeg to evaluate for status epilepticus.  Level of alertness: lethargic  AEDs during EEG study: LEV  Technical aspects: This EEG was obtained using a 10 lead EEG system positioned circumferentially without any parasagittal coverage (rapid EEG). Computer selected EEG is reviewed as  well as background features and all clinically significant events.  Description: EEG showed continuous generalized polymorphic 3 to 6 Hz theta-delta slowing admixed with 14-16hz  frontocentral beta activity. Intermittent sharply contoured 3-5Hz  theta-delta slowing was also seen in left frontotemporal region. Sharp transient was seen in left temporal region.  Patient was noted to have right leg twitching per RN. However, no eeg change was seen suggestive of seizure.  Hyperventilation and photic stimulation were not performed.     ABNORMALITY - Intermittent slow, left fronto-temporal region - Continuous slow, generalized  IMPRESSION: This limited ceribell EEG is suggestive of cortical dysfunction arising from left frontotemporal region, nonspecific etiology but could be secondary to underlying prior stroke, ictal/post-ictal state. . Additionally, this study is suggestive of moderate to severe diffuse encephalopathy, nonspecific etiology.   RN reported right leg twitching during the study without concomitant eeg change. However, focal motor seizures may not be seen on scalp eeg. Therefore, consider video eeg and clinical correlation.  Pasty Manninen [name]

## 2021-10-08 NOTE — Significant Event (Addendum)
Patient transferred from [facility] to [facility] ED admitted under [place] Transferred as per Neuro-acute encephalopathy concern about status epilepticus for continuous EEG. On arrival patient hypoxic needing nonrebreather ICU was emergently called to the ED, was seen by neurology who did not feel patient was in status epilepticus clinically Patient was able to respond somewhat encephalopathic, wife at the bedside, Given his poor baseline functional status ALS x3 years, WC bound-changed to DNR by critical care and if worsens further we will get a palliative approach, palliative care also consulted. Issues: Acute metabolic encephalopathy rule out to status epilepticus with continuous EEG, defer to neurology on Keppra, treat underlying renal failure, pneumonia sepsis, could have acute stroke unable to obtain MRI due to pacemaker  Sepsis POA with pneumonia, met sepsis criteria with with tachypnea leukocytosis fever.  Chest x-ray abnormal, received azithromycin yesterday, pharmacy consulted for Unasyn,?  Aspiration given his poor mental status gurgling sound.  Acute hypoxic respiratory failure due to pneumonia and encephalopathy: On nonrebreather currently, patient wife at the bedside agreed for DNR no intubation, seen by pulm critical care.  Oxygen weaned down to HFNC 5 L  Hypertension blood pressure stable HLD holding p.o. meds  Hyperkalemia Metabolic acidosis AKI on CKD stage IIIb Baseline creatinine was around 1.5 05/07/21,but was 2.7 on 06/08/21: Follow serial potassium, unable to take p.o. Lokelma we will treat with insulin dextrose, IV fluid, if worsens further after nephro consult.  Coffee-ground emesis episode FOBT negative seen by GI-doubt significant GI bleed.  Continue PPI  Enlarging renal lesion 2.6 cm right renal increasing since 2017 cannot exclude RCC will need renal protocol MRI with and without contrast not emergently  Pacemaker in place  Fecal impaction-rectosigmoid stool ball:  laxatives as needed.  ALS x3 years, poor functional status WC bound but at baseline alert awake oriented x4 GOC: DNR.  Overall prognosis remains to be seen, does not appear bright  Spoke with the ED nurse, for stat labs to be sent for BMP, but patient got moved to 3 W.: spoke w/ Nursing staff and labs being called for bmp.  Discussed with Dr Avon Gully , PCCM and neurology as well as nursing staff. Plan of care discussed in detail with wife at the bedside.

## 2021-10-08 NOTE — Progress Notes (Signed)
LTM Setup at bedside. No skin breakdown noted. Not MRI compatible leads used. Results pending.

## 2021-10-08 NOTE — Progress Notes (Signed)
Pharmacy Antibiotic Note  Darrell [name] is a 71 y.o. male admitted on 10/07/2021 with pneumonia.  Pharmacy has been consulted for unasyn dosing.  SCr 2.94  WBC 13.4; T 100.4 F COVID/Flu neg  Plan: Unasyn 3 g q12h Trend WBC, Fever, Renal function, & Clinical course F/u cultures, clinical course, WBC, fever De-escalate when able  Weight: 78.4 kg (172 lb 13.5 oz) (from ECHO)  Temp (24hrs), Avg:99.1 F (37.3 C), Min:98.2 F (36.8 C), Max:100.4 F (38 C)  Recent Labs  Lab 10/07/21 1124 10/08/21 0515  WBC 13.4*  --   CREATININE 2.57* 2.94*    Estimated Creatinine Clearance: 26.3 mL/min (A) (by C-G formula based on SCr of 2.94 mg/dL (H)).    Allergies  Allergen Reactions   Ace Inhibitors Other (See Comments)    Hyperkalemia   Doxycycline Other (See Comments)    Hiccups, cough, nausea and emesis, elevated liver enzymes, elevated eosinophils, SOB concerning for early DRESS syndrome    Atacand Hct [Candesartan Cilexetil-Hctz] Hives   Shellfish Allergy Hives    Antimicrobials this admission: unasyn 1/14 >>   Microbiology results: Pending  Thank you for allowing pharmacy to be a part of this patients care.  Lorelei Pont, PharmD, BCPS 10/08/2021 12:43 PM ED Clinical Pharmacist -  [phone]

## 2021-10-08 NOTE — Consult Note (Signed)
[facility] TeleNeurology Consult Services   Patient Name:   Darrell [name], Darrell [name] Date of Birth:   Mar 29, 1951 Identification Number:   MRN - [id number] Date of Service:   10/08/2021 06:59:02  Diagnosis:       G40.209 - Partial symptomatic epilepsy with complex partial seizure, not intractable,without status epilepticus ([place])       F01.518 - Vascular dementia, unspecified severity, with other behavioral disturbance.       G93.41 - Encephalopathy Metabolic       Z66.29 - White matter disease, unspecified.  Impression:      Patient with multiple active issues:    1) poor baseline brain function (I count at least 5 prior strokes on CT head) and poor baseline neuro exam    2) Uremic encephalopathy BUN 122    3) Hyperkalemia may be contributing to diffuse weakness via prevention of muscle repolarization    4) Severe constipation with large sigmoid stool ball and distension of stomach/esophagus and spitting up small amounts of blood per wife    5) Hx seizures now with intermittent R leg twitching    PLAN:    Unable to give tpa due to his only ongoing new symptoms being sleepiness and diffuse weakness which are confounded by acute medical condition - I am not sure he actually has a GI bleed that would be a hard contraindication to tpa, but this was also discussed as a contraindication with his wife.    Keppra 500 mg BID with rising therapeutic levels in setting of renal dysfunction, so ok to continue dose    CHECK EEG - advised RN stat - this would be an acutely treatable cause of somnolence of focal status / nonconvulsive status is found    Discussed with primary MD his ongoing treatment of hyperK and options for expediting clearance of his uremia    Appreciate bedside RN providing excellent suctioning etc, VSS at present.     I am concerned this patient needs at least Step Down level of care based on only arousing to deep suctioning at present.    Telemetry, eval for  AFib    Repeat head CT in 36-48 hrs if patient has not improved mentally      Metrics: Last Known Well: 10/08/2021 05:50:00 [facility] Notification Time: 10/08/2021 06:59:02 Stamp Time: 10/08/2021 06:59:02 Initial Response Time: 10/08/2021 07:04:30 Symptoms: vomiting blood. NIHSS Start Assessment Time: 10/08/2021 07:07:12 Patient is not a candidate for Thrombolytic. Thrombolytic Medical Decision: 10/08/2021 07:08:00 Patient was not deemed candidate for Thrombolytic because of following reasons: GI Bleeding (Within 21 Days). Other Diagnosis suspected.  I personally Reviewed the CT Head and it [place] physician Notified of Diagnostic Impression and Management Plan: 10/08/2021 07:57:41 Able to Reach 10/08/2021 07:57:41  Advanced Imaging: Advanced Imaging Not Completed because:  Renal failure elevated mrs = not a candidate for CTA / thrombectomy   Our recommendations are outlined below.  Recommendations:        Neuro Checks       Bedside Swallow Eval       DVT Prophylaxis       IV Fluids, Normal Saline       Head of Bed 30 Degrees       Euglycemia and Avoid Hyperthermia (PRN Acetaminophen)       aspirin 81 mg once GI bleed satisfactorily ruled out  Routine Consultation with [facility] for Follow up Care  Sign Out:       Discussed with Primary Attending    ------------------------------------------------------------------------------  History of Present Illness: Patient is a 70 year old Male.  Inpatient stroke alert was called for symptoms of "vomiting blood" yesterday followed by somnolence this morning.  Patient with a baseline of post stroke L hemiplegia 2012, bed bound, dementia, speaks in limited phrases to make needs known to caregivers, HTN, seizures, HLD. Patient brought in by wife for Art / bloody sputum, concern for upper GI bleed , AKI, hyperkalemia while boarding in the ER. Patient had an acute onset change in responsiveness  this morning at 6:15, and his L side was even more droopy than normal as per wife and RN (face now looks close to baseline, but patient is still somnolent). Potassium and Cr elevated worse this morning compared with last night, K up to 6.7 from 5.7 and BUN up to 122 from 69 last night. Patient also with R leg twitching and hiccuping noted by RN, but this is not a known seizure semiology for him. He received home keppra dose 500 BID while here.   Past Medical History:      Hypertension      Hyperlipidemia      Stroke      Seizures      There is no history of Atrial Fibrillation  Medications:  No Anticoagulant use  Antiplatelet use: Yes aspirin Reviewed EMR for current medications  Allergies:  Reviewed  Social History: Patient Is: Married Smoking: Former Drug Use: No  Family History:  Family History Cannot Be Obtained Because:Patient Cannot Speak  ROS : 14 Points Review of Systems was performed and was negative except mentioned in HPI. ROS Cannot Be Obtained Because:  Patient Cannot Speak  Past Surgical History: Past Surgical History Cannot Be Obtained Because: Patient Cannot Speak    Examination: BP(103/73), Pulse(78), Blood Glucose(149) 1A: Level of Consciousness - Movements to Pain + 2 1B: Ask Month and Age - Could Not Answer Either Question Correctly + 2 1C: Blink Eyes & Squeeze Hands - Performs 0 Tasks + 2 2: Test Horizontal Extraocular Movements - Normal + 0 3: Test Visual Fields - Patient is Bilaterally Blind + 3 4: Test Facial Palsy (Use Grimace if Obtunded) - Minor paralysis (flat nasolabial fold, smile asymmetry) + 1 5A: Test Left Arm Motor Drift - No Movement + 4 5B: Test Right Arm Motor Drift - No Effort Against Gravity + 3 6A: Test Left Leg Motor Drift - No Movement + 4 6B: Test Right Leg Motor Drift - No Effort Against Gravity + 3 7: Test Limb Ataxia (FNF/Heel-Shin) - No Ataxia + 0 8: Test Sensation - Complete Loss: Cannot Sense Being Touched At All +  2 9: Test Language/Aphasia - Coma/Unresponsive + 3 10: Test Dysarthria - Mute/Anarthric + 2 11: Test Extinction/Inattention - No abnormality + 0  NIHSS Score: 31   Pre-Morbid Modified Rankin Scale: 4 Points = Moderately severe disability; unable to walk and attend to bodily needs without assistance   Patient/Family was informed the Neurology Consult would occur via TeleHealth consult by way of interactive audio and video telecommunications and consented to receiving care in this manner.   Patient is being evaluated for possible acute neurologic impairment and high probability of imminent or life-threatening deterioration. I spent total of 67 minutes providing care to this patient, including time for face to face visit via telemedicine, review of medical records, imaging studies and discussion of findings with providers, the patient and/or family.   Dr Lincoln Maxin   [facility] [phone]   Case [id number]

## 2021-10-08 NOTE — ED Notes (Signed)
Wife is at the bedside. She reports that she noticed a change this AM. His baseline is normally responsive to her voice, oriented x4, able to follow commands, the current breathing pattern is new, pt is normally able to follow commands and uses a wheelchair at home. Wife endorses hx of seizures

## 2021-10-08 NOTE — ED Provider Notes (Signed)
Pt was admitted by the hospitalist service at [facility], but was sent here to get a stat EEG.  When pt arrived, his O2 sat was in the 70s on RA and his mental status was poor.  He initially would not respond to anything.  He has a hx of ALS and has had a prior stroke and does not have a very good quality of life.  I talked to the wife about whether she wants him on a ventilator if necessary.  She wants to think about if and talk to the icu doctors to get their input.  Dr. Lupita Leash ([facility]) contacted ccm who did come down to see him.  He is made a DNR.   Isla Pence, MD 10/08/21 1308

## 2021-10-08 NOTE — Plan of Care (Signed)

## 2021-10-09 ENCOUNTER — Inpatient Hospital Stay (HOSPITAL_COMMUNITY): Payer: Medicare HMO

## 2021-10-09 DIAGNOSIS — G6181 Chronic inflammatory demyelinating polyneuritis: Secondary | ICD-10-CM

## 2021-10-09 DIAGNOSIS — J9601 Acute respiratory failure with hypoxia: Secondary | ICD-10-CM | POA: Diagnosis not present

## 2021-10-09 DIAGNOSIS — A419 Sepsis, unspecified organism: Secondary | ICD-10-CM | POA: Diagnosis not present

## 2021-10-09 DIAGNOSIS — R4182 Altered mental status, unspecified: Secondary | ICD-10-CM

## 2021-10-09 DIAGNOSIS — E782 Mixed hyperlipidemia: Secondary | ICD-10-CM | POA: Diagnosis not present

## 2021-10-09 DIAGNOSIS — G40901 Epilepsy, unspecified, not intractable, with status epilepticus: Secondary | ICD-10-CM | POA: Diagnosis not present

## 2021-10-09 DIAGNOSIS — R0602 Shortness of breath: Secondary | ICD-10-CM | POA: Diagnosis not present

## 2021-10-09 DIAGNOSIS — Z66 Do not resuscitate: Secondary | ICD-10-CM | POA: Diagnosis not present

## 2021-10-09 DIAGNOSIS — D62 Acute posthemorrhagic anemia: Secondary | ICD-10-CM | POA: Diagnosis not present

## 2021-10-09 DIAGNOSIS — Z515 Encounter for palliative care: Secondary | ICD-10-CM | POA: Diagnosis not present

## 2021-10-09 DIAGNOSIS — E669 Obesity, unspecified: Secondary | ICD-10-CM | POA: Diagnosis not present

## 2021-10-09 DIAGNOSIS — I129 Hypertensive chronic kidney disease with stage 1 through stage 4 chronic kidney disease, or unspecified chronic kidney disease: Secondary | ICD-10-CM | POA: Diagnosis not present

## 2021-10-09 DIAGNOSIS — I69354 Hemiplegia and hemiparesis following cerebral infarction affecting left non-dominant side: Secondary | ICD-10-CM | POA: Diagnosis not present

## 2021-10-09 DIAGNOSIS — N1832 Chronic kidney disease, stage 3b: Secondary | ICD-10-CM | POA: Diagnosis not present

## 2021-10-09 DIAGNOSIS — J189 Pneumonia, unspecified organism: Secondary | ICD-10-CM | POA: Diagnosis not present

## 2021-10-09 DIAGNOSIS — K920 Hematemesis: Secondary | ICD-10-CM | POA: Diagnosis not present

## 2021-10-09 DIAGNOSIS — F17210 Nicotine dependence, cigarettes, uncomplicated: Secondary | ICD-10-CM | POA: Diagnosis not present

## 2021-10-09 DIAGNOSIS — K219 Gastro-esophageal reflux disease without esophagitis: Secondary | ICD-10-CM | POA: Diagnosis not present

## 2021-10-09 DIAGNOSIS — D649 Anemia, unspecified: Secondary | ICD-10-CM | POA: Diagnosis not present

## 2021-10-09 DIAGNOSIS — H543 Unqualified visual loss, both eyes: Secondary | ICD-10-CM | POA: Diagnosis not present

## 2021-10-09 DIAGNOSIS — Z7189 Other specified counseling: Secondary | ICD-10-CM

## 2021-10-09 DIAGNOSIS — E872 Acidosis, unspecified: Secondary | ICD-10-CM | POA: Diagnosis not present

## 2021-10-09 DIAGNOSIS — E875 Hyperkalemia: Secondary | ICD-10-CM | POA: Diagnosis not present

## 2021-10-09 DIAGNOSIS — K5641 Fecal impaction: Secondary | ICD-10-CM | POA: Diagnosis not present

## 2021-10-09 DIAGNOSIS — G40201 Localization-related (focal) (partial) symptomatic epilepsy and epileptic syndromes with complex partial seizures, not intractable, with status epilepticus: Secondary | ICD-10-CM | POA: Diagnosis not present

## 2021-10-09 DIAGNOSIS — Z452 Encounter for adjustment and management of vascular access device: Secondary | ICD-10-CM | POA: Diagnosis not present

## 2021-10-09 DIAGNOSIS — R6520 Severe sepsis without septic shock: Secondary | ICD-10-CM | POA: Diagnosis not present

## 2021-10-09 DIAGNOSIS — F0150 Vascular dementia without behavioral disturbance: Secondary | ICD-10-CM | POA: Diagnosis not present

## 2021-10-09 DIAGNOSIS — G928 Other toxic encephalopathy: Secondary | ICD-10-CM | POA: Diagnosis not present

## 2021-10-09 DIAGNOSIS — Z20822 Contact with and (suspected) exposure to covid-19: Secondary | ICD-10-CM | POA: Diagnosis not present

## 2021-10-09 DIAGNOSIS — K5649 Other impaction of intestine: Secondary | ICD-10-CM | POA: Diagnosis not present

## 2021-10-09 DIAGNOSIS — J690 Pneumonitis due to inhalation of food and vomit: Secondary | ICD-10-CM | POA: Diagnosis not present

## 2021-10-09 DIAGNOSIS — R710 Precipitous drop in hematocrit: Secondary | ICD-10-CM

## 2021-10-09 DIAGNOSIS — N179 Acute kidney failure, unspecified: Secondary | ICD-10-CM | POA: Diagnosis not present

## 2021-10-09 DIAGNOSIS — G9340 Encephalopathy, unspecified: Secondary | ICD-10-CM | POA: Diagnosis not present

## 2021-10-09 DIAGNOSIS — Z4682 Encounter for fitting and adjustment of non-vascular catheter: Secondary | ICD-10-CM | POA: Diagnosis not present

## 2021-10-09 LAB — PREPARE RBC (CROSSMATCH)

## 2021-10-09 LAB — RENAL FUNCTION PANEL
Albumin: 2.4 g/dL — ABNORMAL LOW (ref 3.5–5.0)
Anion gap: 7 (ref 5–15)
BUN: 126 mg/dL — ABNORMAL HIGH (ref 8–23)
CO2: 18 mmol/L — ABNORMAL LOW (ref 22–32)
Calcium: 9.1 mg/dL (ref 8.9–10.3)
Chloride: 116 mmol/L — ABNORMAL HIGH (ref 98–111)
Creatinine, Ser: 3.5 mg/dL — ABNORMAL HIGH (ref 0.61–1.24)
GFR, Estimated: 18 mL/min — ABNORMAL LOW (ref >60)
Glucose, Bld: 267 mg/dL — ABNORMAL HIGH (ref 70–99)
Phosphorus: 2.6 mg/dL (ref 2.5–4.6)
Potassium: 6 mmol/L — ABNORMAL HIGH (ref 3.5–5.1)
Sodium: 141 mmol/L (ref 135–145)

## 2021-10-09 LAB — POTASSIUM
Potassium: 6.8 mmol/L (ref 3.5–5.1)
Potassium: 5.9 mmol/L — ABNORMAL HIGH (ref 3.5–5.1)
Potassium: 5.6 mmol/L — ABNORMAL HIGH (ref 3.5–5.1)
Potassium: 5.3 mmol/L — ABNORMAL HIGH (ref 3.5–5.1)

## 2021-10-09 LAB — HEMOGLOBIN AND HEMATOCRIT, BLOOD
HCT: 20.4 % — ABNORMAL LOW (ref 39.0–52.0)
HCT: 20.5 % — ABNORMAL LOW (ref 39.0–52.0)
Hemoglobin: 6.3 g/dL — CL (ref 13.0–17.0)
Hemoglobin: 6.2 g/dL — CL (ref 13.0–17.0)

## 2021-10-09 LAB — HEPATIC FUNCTION PANEL
ALT: 10 U/L (ref 0–44)
AST: 9 U/L — ABNORMAL LOW (ref 15–41)
Albumin: 2.5 g/dL — ABNORMAL LOW (ref 3.5–5.0)
Alkaline Phosphatase: 47 U/L (ref 38–126)
Bilirubin, Direct: <0.1 mg/dL (ref 0.0–0.2)
Total Bilirubin: <0.1 mg/dL — ABNORMAL LOW (ref 0.3–1.2)
Total Protein: 4.7 g/dL — ABNORMAL LOW (ref 6.5–8.1)

## 2021-10-09 LAB — AMMONIA: Ammonia: 27 umol/L (ref 9–35)

## 2021-10-09 LAB — SODIUM, URINE, RANDOM: Sodium, Ur: 54 mmol/L

## 2021-10-09 LAB — CREATININE, URINE, RANDOM: Creatinine, Urine: 60.09 mg/dL

## 2021-10-09 MED ORDER — SODIUM ZIRCONIUM CYCLOSILICATE 10 G PO PACK
10.0000 g | PACK | Freq: Once | ORAL | Status: AC
  Administered 2021-10-09: 10 g
  Filled 2021-10-09: qty 1

## 2021-10-09 MED ORDER — GLYCOPYRROLATE 1 MG PO TABS
1.0000 mg | ORAL_TABLET | Freq: Two times a day (BID) | ORAL | Status: DC
  Administered 2021-10-09 – 2021-10-11 (×4): 1 mg
  Filled 2021-10-09 (×5): qty 1

## 2021-10-09 MED ORDER — INSULIN ASPART 100 UNIT/ML IV SOLN
10.0000 [IU] | Freq: Once | INTRAVENOUS | Status: AC
Start: 2021-10-09 — End: 2021-10-09
  Administered 2021-10-09: 10 [IU] via INTRAVENOUS

## 2021-10-09 MED ORDER — AMLODIPINE BESYLATE 5 MG PO TABS
5.0000 mg | ORAL_TABLET | Freq: Every day | ORAL | Status: DC
  Administered 2021-10-10 – 2021-10-11 (×2): 5 mg
  Filled 2021-10-09 (×3): qty 1

## 2021-10-09 MED ORDER — CALCIUM GLUCONATE-NACL 1-0.675 GM/50ML-% IV SOLN
1.0000 g | Freq: Once | INTRAVENOUS | Status: AC
Start: 2021-10-09 — End: 2021-10-09
  Administered 2021-10-09: 1000 mg via INTRAVENOUS
  Filled 2021-10-09: qty 50

## 2021-10-09 MED ORDER — AMLODIPINE BESYLATE 5 MG PO TABS
5.0000 mg | ORAL_TABLET | Freq: Every day | ORAL | Status: DC
  Administered 2021-10-09: 5 mg via ORAL

## 2021-10-09 MED ORDER — FOOD THICKENER (SIMPLYTHICK)
10.0000 | ORAL | Status: DC | PRN
  Filled 2021-10-09: qty 10

## 2021-10-09 MED ORDER — GUAIFENESIN-DM 100-10 MG/5ML PO SYRP: 5.0000 mL | ORAL_SOLUTION | ORAL | Status: DC | PRN

## 2021-10-09 MED ORDER — SODIUM ZIRCONIUM CYCLOSILICATE 10 G PO PACK
10.0000 g | PACK | Freq: Once | ORAL | Status: AC
Start: 2021-10-09 — End: 2021-10-09
  Administered 2021-10-09: 10 g
  Filled 2021-10-09: qty 1

## 2021-10-09 MED ORDER — ATORVASTATIN CALCIUM 40 MG PO TABS
40.0000 mg | ORAL_TABLET | Freq: Every day | ORAL | Status: DC
  Administered 2021-10-10 – 2021-10-11 (×2): 40 mg
  Filled 2021-10-09 (×2): qty 1

## 2021-10-09 MED ORDER — DEXTROSE 50 % IV SOLN
1.0000 | Freq: Once | INTRAVENOUS | Status: AC
  Administered 2021-10-09: 50 mL via INTRAVENOUS
  Filled 2021-10-09: qty 50

## 2021-10-09 MED ORDER — HYDROXYZINE HCL 25 MG PO TABS
25.0000 mg | ORAL_TABLET | Freq: Every day | ORAL | Status: DC
  Administered 2021-10-09 – 2021-10-10 (×2): 25 mg
  Filled 2021-10-09 (×2): qty 1

## 2021-10-09 MED ORDER — HYDRALAZINE HCL 20 MG/ML IJ SOLN: 10.0000 mg | Freq: Four times a day (QID) | INTRAMUSCULAR | Status: DC | PRN

## 2021-10-09 MED ORDER — SODIUM CHLORIDE 0.9% IV SOLUTION: Freq: Once | INTRAVENOUS | Status: AC

## 2021-10-09 NOTE — Evaluation (Signed)
Clinical/Bedside Swallow Evaluation Patient Details  Name: Darrell [name] MRN: [id number] Date of Birth: 06-Apr-1951  Today's Date: 10/09/2021 Time: SLP Start Time (ACUTE ONLY): 1200 SLP Stop Time (ACUTE ONLY): 1228 SLP Time Calculation (min) (ACUTE ONLY): 28 min  Past Medical History:  Past Medical History:  Diagnosis Date   Arthritis    At high risk for falls 08/16/2015   Bradycardia    Chronic kidney disease    stage 3 GFR 30-59 ml/min    CIDP (chronic inflammatory demyelinating polyneuropathy) ([name])    CKD (chronic kidney disease) stage 3, GFR 30-59 ml/min ([name]) 08/16/2015   Dysphagia as late effect of cerebrovascular disease    pts wife states pt has to eat soft foods    Elevated liver enzymes 08/10/2016   GERD (gastroesophageal reflux disease)    Glaucoma    High cholesterol    History of CVA with residual deficit 03/25/2013   Hypertension    Hypertensive retinopathy of both eyes 01/16/2017   Inguinal hernia 03/25/2013   Liver hemangioma 08/14/2016   Neuromuscular disorder ([place])    chronic inflammatory demyelinating polyneuropathy    New onset seizure ([place]) 07/08/2017   seizure 07/14/18   Nuclear sclerosis of both eyes 10/25/2016   Presence of permanent cardiac pacemaker    placed in april 2018   Primary open angle glaucoma of both eyes, indeterminate stage 10/25/2016   Renal mass, right 08/14/2016   Status cardiac pacemaker 01/29/2017   Placed for second degree heart block on 01/16/17 Medtronic Azure XT DR MRI SureScan dual-chamber pacemaker   Stroke [facility])    2011 with residual deficit left sided weakness   Tobacco dependence    Past Surgical History:  Past Surgical History:  Procedure Laterality Date   EYE SURGERY     HERNIA REPAIR     INGUINAL HERNIA REPAIR Right 11/23/2014   Procedure: right inguinal hernia repair with mesh;  Surgeon: Armandina Gemma, MD;  Location: [facility];  Service: General;  Laterality: Right;   INSERTION OF MESH N/A 11/23/2014   Procedure: INSERTION OF  MESH;  Surgeon: Armandina Gemma, MD;  Location: [facility];  Service: General;  Laterality: N/A;   MASS EXCISION Left 08/29/2017   Procedure: EXCISION OF LEFT NECK MASS;  Surgeon: Coralie Keens, MD;  Location: [place] [facility];  Service: General;  Laterality: Left;   PACEMAKER IMPLANT N/A 01/16/2017   Procedure: Pacemaker Implant;  Surgeon: Will Meredith Leeds, MD;  Location: [facility];  Service: Cardiovascular;  Laterality: N/A;   SHOULDER SURGERY Bilateral 1988, 1998   HPI:  [name] is a 71 y.o. male with medical history significant of osteoarthritis, bradycardia, stage III CKD dysphagia, GERD, glaucoma, hyperlipidemia history of other nonhemorrhagic CVA, hypertension, inguinal hernia, liver hemangioma muscular disorder, nuclear sclerosis of both eyes. Pt with coffee ground emesis x1 during admission, GI following.  Most recent MBS in 08/20 recommending dysphagia 1 solids and thin liquids with meds whole in puree.    Assessment / Plan / Recommendation  Clinical Impression  Pt was seen for a bedside swallow evaluation and he presents with suspected oropharyngeal dysphagia.  Pt was encountered lethargic in bed and he remained drowsy throughout this evaluation; however, he participated well and was motivated to consume PO trials.  Low vocal intensity was noted upon SLP arrival. Oral mechanism examination was limited secondary to lethargy; however, pt appeared to have generalized oral motor weakness and missing dentition.  Wife reported that pt has dentures at home, but that he  does not use them when eating/drinking.  She additionally reported that the pt consumes a Dysphagia 3 (soft) diet with thin liquids at home without difficulty.  Pt consumed ice chips, thin liquid, nectar-thick liquid, and puree during this evaluation.  Pt with immediate/delayed coughing and wet vocal quality with ice chips and thin liquid (tsp/straw).  Cough was noted to be strong. No overt s/sx of aspiration were observed with  nectar-thick liquid or puree; however, multiple swallows per bolus were intermittently observed with puree, possibly indicative of pharyngeal residue or esophageal dysfunction.  Recommend diet initiation of Dysphagia 1 (puree) solids and nectar-thick liquids with medication administered crushed in puree.  Pt may benefit from a modified barium swallow study if clinical s/sx of aspiration persist.  SLP Visit Diagnosis: Dysphagia, oropharyngeal phase (R13.12)    Aspiration Risk  Moderate aspiration risk    Diet Recommendation Dysphagia 1 (Puree);Nectar-thick liquid   Liquid Administration via: Cup;Straw Medication Administration: Crushed with puree Supervision: Full supervision/cueing for compensatory strategies;Staff to assist with self feeding Compensations: Slow rate;Small sips/bites Postural Changes: Seated upright at 90 degrees;Remain upright for at least 30 minutes after po intake    Other  Recommendations Oral Care Recommendations: Oral care BID;Staff/trained caregiver to provide oral care Other Recommendations: Order thickener from pharmacy;Remove water pitcher;Prohibited food (jello, ice cream, thin soups)    Recommendations for follow up therapy are one component of a multi-disciplinary discharge planning process, led by the attending physician.  Recommendations may be updated based on patient status, additional functional criteria and insurance authorization.  Follow up Recommendations Skilled nursing-short term rehab (<3 hours/day)      Assistance Recommended at Discharge Frequent or constant Supervision/Assistance  Functional Status Assessment Patient has had a recent decline in their functional status and demonstrates the ability to make significant improvements in function in a reasonable and predictable amount of time.  Frequency and Duration min 2x/week  2 weeks       Prognosis Prognosis for Safe Diet Advancement: Fair      Swallow Study   General HPI: [name]  is a 71 y.o. male with medical history significant of osteoarthritis, bradycardia, stage III CKD dysphagia, GERD, glaucoma, hyperlipidemia history of other nonhemorrhagic CVA, hypertension, inguinal hernia, liver hemangioma muscular disorder, nuclear sclerosis of both eyes. Pt with coffee ground emesis x1 during admission, GI following.  Most recent MBS in 08/20 recommending dysphagia 1 solids and thin liquids with meds whole in puree. Type of Study: Bedside Swallow Evaluation Previous Swallow Assessment: See HPI Diet Prior to this Study: NPO Temperature Spikes Noted: No Respiratory Status: Room air History of Recent Intubation: No Behavior/Cognition: Lethargic/Drowsy;Cooperative Oral Cavity Assessment: Within Functional Limits Oral Care Completed by SLP: No Oral Cavity - Dentition: Missing dentition;Dentures, not available Self-Feeding Abilities: Needs assist Patient Positioning: Upright in bed Baseline Vocal Quality: Low vocal intensity Volitional Cough: Strong Volitional Swallow: Able to elicit    Oral/Motor/Sensory Function Overall Oral Motor/Sensory Function: Generalized oral weakness (limited secondary to lethargy)   Ice Chips Ice chips: Impaired Presentation: Spoon Pharyngeal Phase Impairments: Cough - Delayed;Wet Vocal Quality   Thin Liquid Thin Liquid: Impaired Presentation: Spoon;Straw Pharyngeal  Phase Impairments: Cough - Immediate;Cough - Delayed;Wet Vocal Quality;Multiple swallows    Nectar Thick Nectar Thick Liquid: Within functional limits Presentation: Spoon;Straw   Honey Thick Honey Thick Liquid: Not tested   Puree Puree: Impaired Presentation: Spoon Pharyngeal Phase Impairments: Multiple swallows   Solid     Solid: Not tested     Bretta Bang, M.S.,  [place] Office: [phone]  Elvia Collum Guy Toney 10/09/2021,12:46 PM

## 2021-10-09 NOTE — Progress Notes (Signed)
Neurology Progress Note  Subjective: Reported improvement in patient's mental status overnight to "almost baseline" per patient's spouse at bedside. Patient without abnormal twitching/jerking movements on examination this morning  Exam: Vitals:   10/09/21 0840 10/09/21 0841  BP:    Pulse:    Resp:    Temp:    SpO2: 98% 98%   Gen: Laying in hospital bed with wife at bedside, in no acute distress Resp: non-labored breathing, no respiratory distress Abd: soft, non-tender, non-distended  Neuro: Mental Status: Drowsy but wakes easily to voice. Patient is able to state his name and his age correctly as well as tell examiner that he is in the hospital. He is unable to correctly state the month or the current year (baseline per patient's spouse at bedside). Patient is also unable to give a reliable history.  Patient's speech is hypophonic as patient does not speak over a whisper, dysarthric, and requires repetition and mouth reading for understanding most of his communication.  He states that he is blind in his left eye and mostly in his right eye and he is unable to see provider at bedside.  He follows simple commands without difficulty. Cranial Nerves: PERRL, eyes are dysconjugate but he is able to gaze in all directions on command, face is symmetric resting and with movement, hearing is intact to voice, tongue is midline.  Motor: Patient is able to elevate right upper extremity antigravity without vertical drift, left upper extremity is able to elevate slightly antigravity with chronic hemiplegia from previous stroke. Bilateral grip strength is 5/5.  Bilateral lower extremities are able to elevate antigravity briefly and move without gravity.  Patient is bedbound at baseline and last walked about 1.5 years ago.  No jerking or myoclonus noted on examination.  Sensory:Intact and symmetric to light touch throughout DTR: 3+ and symmetric biceps, brachioradialis, and patellae. Sustained clonus  with ankle dorsiflexion bilaterally.  Gait: Deferred as patient is bedbound at baseline  Pertinent Labs: CBC    Component Value Date/Time   WBC 10.9 (H) 10/08/2021 1449   RBC 2.45 (L) 10/08/2021 1449   HGB 6.3 (LL) 10/09/2021 0322   HGB 11.8 (L) 06/08/2021 1050   HCT 20.4 (L) 10/09/2021 0322   HCT 35.2 (L) 06/08/2021 1050   PLT 159 10/08/2021 1449   PLT 204 06/08/2021 1050   MCV 96.3 10/08/2021 1449   MCV 90 06/08/2021 1050   MCH 30.2 10/08/2021 1449   MCHC 31.4 10/08/2021 1449   RDW 13.4 10/08/2021 1449   RDW 12.1 06/08/2021 1050   LYMPHSABS 1.3 10/07/2021 1124   LYMPHSABS 2.3 06/08/2021 1050   MONOABS 0.9 10/07/2021 1124   EOSABS 0.1 10/07/2021 1124   EOSABS 0.6 (H) 06/08/2021 1050   BASOSABS 0.0 10/07/2021 1124   BASOSABS 0.1 06/08/2021 1050   CMP     Component Value Date/Time   NA 141 10/09/2021 0322   NA 139 06/08/2021 1050   K 6.0 (H) 10/09/2021 0322   CL 116 (H) 10/09/2021 0322   CO2 18 (L) 10/09/2021 0322   GLUCOSE 267 (H) 10/09/2021 0322   BUN 126 (H) 10/09/2021 0322   BUN 51 (H) 06/08/2021 1050   CREATININE 3.50 (H) 10/09/2021 0322   CREATININE 1.45 (H) 08/10/2016 1647   CALCIUM 9.1 10/09/2021 0322   PROT 5.2 (L) 10/08/2021 0515   PROT 6.5 06/08/2021 1050   ALBUMIN 2.4 (L) 10/09/2021 0322   ALBUMIN 4.1 06/08/2021 1050   AST 10 (L) 10/08/2021 0515   ALT 11 10/08/2021 0515  ALKPHOS 58 10/08/2021 0515   BILITOT 0.5 10/08/2021 0515   BILITOT 0.3 06/08/2021 1050   GFRNONAA 18 (L) 10/09/2021 0322   GFRNONAA 51 (L) 08/10/2016 1647   GFRAA 37 (L) 03/31/2020 1417   GFRAA 58 (L) 08/10/2016 1647   Imaging Reviewed:  EEG 10/08/21 - 10/09/21: "This was an abnormal continuous video EEG due to mild background slowing, indicative of a non-specific encephalopathy pattern. No seizures or epileptiform discharges were seen."  Assessment: 71 year old male with a history of ALS x 3 years, stroke with left-hemipelgia, bedbound at baseline, dementia and complex partial  seizures who presented to the [facility] ED with concern for upper GIB on 10/07/21. At [place] on 10/08/21 he had an acute change in responsiveness and Teleneurology was consulted. Seizure was suspected given right leg twitching and hiccuping. He had been continued on his home Keppra dose of 500 mg BID while in the ED. Ceribell EEG band was placed which did not show definite electrographic seizure activity at the resolution of the device. Twitching movements continued. Status epilepticus was suspected and the patient was emergently transferred to [facility] for LTM EEG and follow up neurological assessment.   1. On Keppra 500 mg IV BID, which has been continued from his home dosage regimen. Keppra is at his max dose for his renal function. 2. Ceribell EEG without electrographic seizures, but this modality is low resolution. LTM EEG monitoring was initiated on hospital arrival for further assessment. The Ceribell tracings revealed intermittent slow in the left fronto-temporal region on a background of continuous generalized slowing. RN reported right leg twitching during the study without concomitant eeg change.  3. Overnight LTM was obtained with evidence of mild background slowing, indicative of non-specific encephalopathy without seizures or epileptiform discharges throughout.  4.  CT head: No acute finding. Advanced chronic ischemic injury and brain atrophy 5. Exam reveals no clinical seizure activity, no further jerking/twitching, and improvement in mental status to "near baseline" other than limited intake per wife at bedside. Ddx of presentation includes twitching due to metabolic encephalopathy, versus twitching due to seizure activity resolved with Ativan administration. He certainly is at risk for status epilepticus given his history of seizures. His AKI on the other hand would predispose to asterixis or tremor due to metabolic encephalopathy.   Recommendations: - Discontinuing LTM EEG monitoring - Continue Keppra at  500 mg BID. Patient with CrCl of 26.3; he is currently at maximum dose of Keppra given his AKI. - Cannot obtain MRI due to patient's PPM - Cannot obtain CTA due to AKI - No further AED adjustments necessary at this time without evidence of further seizure-like activity and negative EEG monitoring overnight - Management of anemia, uremia, hyperkalemia and other comorbid conditions per primary team - No further neurology recommendations at this time, please contact neurology on call for further questions or concerns   Darrell [name], AGACNP-BC [facility] [phone]  Electronically signed: Dr. Kerney Elbe

## 2021-10-09 NOTE — Progress Notes (Signed)
CRITICAL VALUE STICKER  CRITICAL VALUE:Hemoglobin=6.3  RECEIVER (on-site recipient of call):Collie Siad Asidi  DATE & TIME NOTIFIED: [place] (representative from lab):Wyline Copas  MD NOTIFIED: Dr. Cyd Silence  TIME OF NOTIFICATION:0500  RESPONSE:  New orders placed in [facility] to be carried out

## 2021-10-09 NOTE — Plan of Care (Signed)

## 2021-10-09 NOTE — Progress Notes (Addendum)
HOSPITAL MEDICINE OVERNIGHT EVENT NOTE    Notified by nursing that patient's hyperkalemia has returned with most recent potassium being 6.8.  EKG was performed revealing widened QRS at 146 ms as well as evidence of peaked T waves.  QRS is not significantly wider than previous EKG performed on 1/13.  I went to evaluate the patient at the bedside who is still quite lethargic and only intermittently following commands.  Nursing reports the patient is an aspiration risk and is unable to take anything by mouth at this time.  Concerning treating the hyperkalemia, it is noted that patient is already receiving a continuous infusion of fluids with bicarbonate.  I have additionally ordered a dose of 10 units of NovoLog with 1 amp of dextrose as well as 1 g of calcium gluconate.    I note that the patient is currently receiving an octreotide infusion however patient has no known history of cirrhosis or varices according to my review the chart and discussion with the wife.  Therefore, an NG tube was successfully placed for administration of Lokelma.    Continuing to monitor patient closely in addition to monitoring serial hemoglobin and hematocrit levels and potassium levels.  Darrell Emerald  MD [facility]   ADDENDUM 1/15 5am  And is now 6.3.  This is compared to a hemoglobin of 10.7 on presentation 1/13.  Per my discussion with nursing patient remains hemodynamically stable.  Patient clinically has not changed and continues to exhibit no obvious evidence of gross bleeding.  Continuing previously ordered octreotide although I doubt that this is a variceal bleed in the absence of any gross bleeding.  Continuing intravenous Protonix twice daily.  Gastroenterology is currently following and should be by to reevaluate the patient later this morning.  Patient is NPO in case they wish to proceed with any endoscopic intervention.  Ordering 2 units of packed red blood cell transfusion taking into  account the rate of blood loss.  Considering the lack of obvious gross bleeding we will ask pathology to review of peripheral smear to evaluate for any evidence of hemolysis as well as obtaining a hepatic function panel.  [name] [name]

## 2021-10-09 NOTE — Progress Notes (Signed)
LTM discontinued at bedside. No skin breakdown noted. Results pending.  °

## 2021-10-09 NOTE — Procedures (Signed)
EEG Procedure CPT/Type of Study: 01655; 24hr EEG with video Referring Provider: Adhikari Primary Neurological Diagnosis: acute encephalopathy  History: This is a 71 yr old patient, undergoing an EEG to evaluate for sepsis, encephalopathy. Clinical State: disoriented  Technical Description:  The EEG was performed using standard setting per the guidelines of American Clinical Neurophysiology Society (ACNS).  A minimum of 21 electrodes were placed on scalp according to the International 10-20 or/and 10-10 Systems. Supplemental electrodes were placed as needed. Single EKG electrode was also used to detect cardiac arrhythmia. Patient's behavior was continuously recorded on video simultaneously with EEG. A minimum of 16 channels were used for data display. Each epoch of study was reviewed manually daily and as needed using standard referential and bipolar montages. Computerized quantitative EEG analysis (such as compressed spectral array analysis, trending, automated spike & seizure detection) were used as indicated.   Day 1: from 1538 10/08/21 to 0730 10/09/21  EEG Description: Overall Amplitude:Normal Predominant Frequency: The background activity showed posterior dominant alpha, with about 7-8 Hz, that was rare. Superimposed Frequencies: frequent theta and some delta activity bilaterally The background was symmetric  Background Abnormalities: generalized slowing; theta-delta as above Rhythmic or periodic pattern: No Epileptiform activity: no Electrographic seizures: no Events: no   Breach rhythm: no  Reactivity: Present  Stimulation procedures:  Hyperventilation: not done Photic stimulation: not done  Sleep Background: Stage II  EKG:no significant arrhythmia  Impression: This was an abnormal continuous video EEG due to mild background slowing, indicative of a non-specific encephalopathy pattern. No seizures or epileptiform discharges were seen.

## 2021-10-09 NOTE — Progress Notes (Addendum)
[name] Progress Note  Subjective: seen in room, more responsive today per the family and RN  Vitals:   10/09/21 1021 10/09/21 1055 10/09/21 1110 10/09/21 1207  BP: 137/80  (!) 145/80 (!) 145/85  Pulse: 83 84 80 80  Resp: _0 Temp: 97.9 F (36.6 C)  98 F (36.7 C) 98.4 F (36.9 C)  TempSrc: Oral  Axillary Axillary  SpO2: 99%  99%   Weight:        Exam  Lethargic, some responsive, a bit better   Chronically ill appearing  no jvd flat neck veins  Chest cta bilat  Cor reg no RG  Abd soft ntnd no ascites  GU normal male w/ foley draining large amts mod amber urine   Ext 1+ L hip edema, no other edema   Neuro as above      Home meds include - norvasc 5 qd, asa, lipitor, brestri aerosphere, keppra 500 bid, metoprolol 50 bid, flomax, eye gtts/ prns/ vits/ supps      Date                          Creat               eGFR    2018                         1.77- 2.66    2019                         1.78- 2.49    2020                         2.48- 4.32    2021                         2.09    July- sept 2022        1.73- 2.72        26- 42 ml/min, stage IIIb             Jan 13                      2.57    Jan 14                      2.94, 3.47      CXR 1/13 - IMPRESSION: Bibasilar atelectasis versus infiltrate.    CXR 1/14 - IMPRESSION: Persistent streaky perihilar opacities and possible more confluent airspace consolidation in the bilateral lung bases.     CT abd noncon 1/13 - Urinary Tract: enlarging now 2.6 cm indeterminate right renal anterior upper pole lesion, slowly increasing in size since 2017. Additional bilateral simple and complex renal cysts are not significantly changed. No renal calculi or hydronephrosis. Bladder is unremarkable.     UA pend      UNa , UCr pend          Assessment/ Plan: AKI on CKD 3b - b/l creatinine 1.73- 2.72, eGFR 26- 42 ml/min from fall 2022.  Creat 2.5 on admit then up to 3.4. CT abd shows no renal obstruction.  UA pending. No nephrotoxins. Suspected AKI due to urinary retention, vol depletion, underlying CKD +/- other.  Foley placed w/ good UOP. Started IVF 100 cc/hr. Pt is  a poor dialysis candidate due to severe comorbidities and debility, have d/w pt's family. Creat stable today. Mentation better today. Cont bicarb gtt, hopefully AKI is recovering.  Hyperkalemia - K+ 6.8 > 6.0 today. Renal diet (low K+) if allowed to eat. Will need NG tube for lokelma/ kayexalate if K+ rises again (vs kayexalate enema).   Metabolic acidosis - cont bicarb gtt  Volume - CXR no edema, no vol ^ on exam  HTN - BP's low normal, dc'd norvasc and lowered metoprolol to 25 bid. BP's up, avoid over treatment, keep SBP > 120 for now w/ AKI.  PNA/ sepsis - no shock, on IV abx AMS - hx of seizure d/o, per neuro no evidence seizures yet CIDP  Anemia - Hb 7- 7.5, transfuse prn        Rob Enas Winchel 10/09/2021, 2:50 PM   Recent Labs  Lab 10/08/21 2021 10/08/21 2315 10/09/21 0322 10/09/21 0957 10/09/21 1120  K 6.2*   < > 6.0* 5.9*  --   BUN 129*  --  126*  --   --   CREATININE 3.43*  --  3.50*  --   --   CALCIUM 9.3  --  9.1  --   --   PHOS  --   --  2.6  --   --   HGB 7.0*  --  6.3*  --  6.2*   < > = values in this interval not displayed.   Inpatient medications:  atorvastatin  40 mg Oral Daily   brimonidine  1 drop Both Eyes Daily   Chlorhexidine Gluconate Cloth  6 each Topical Daily   dorzolamide-timolol  1 drop Both Eyes Daily   glycopyrrolate  1 mg Oral BID   hydrOXYzine  25 mg Oral QHS   latanoprost  1 drop Both Eyes QHS   mometasone-formoterol  2 puff Inhalation BID   ondansetron (ZOFRAN) IV  4 mg Intravenous Once   pantoprazole  40 mg Intravenous Q12H   sodium zirconium cyclosilicate  10 g Per Tube Once   sorbitol, milk of mag, mineral oil, glycerin (SMOG) enema  400 mL Rectal Once   tamsulosin  0.4 mg Oral Daily   umeclidinium bromide  1 puff Inhalation Daily    ampicillin-sulbactam (UNASYN) IV 3 g  (10/08/21 2323)   levETIRAcetam 500 mg (10/08/21 2032)   sodium bicarbonate 150 mEq in D5W infusion 100 mL/hr at 10/09/21 1133   acetaminophen **OR** acetaminophen, bisacodyl, food thickener, guaiFENesin-dextromethorphan, ondansetron **OR** ondansetron (ZOFRAN) IV

## 2021-10-09 NOTE — Progress Notes (Signed)
CRITICAL VALUE STICKER  CRITICAL VALUE: K+=6.8  RECEIVER (on-site recipient of call):Kyoko Elsea RN  DATE & TIME NOTIFIED: 10/09/2021 0001  MESSENGER (representative from lab):Bonnielee Haff  MD NOTIFIED: [place]  TIME OF NOTIFICATION 0030:  RESPONSE:  No new orders given at this time.

## 2021-10-09 NOTE — Consult Note (Signed)
Consultation Note Date: 10/09/2021   Patient Name: Darrell [name]  DOB: Aug 21, 1951  MRN: [id number]  Age / Sex: 71 y.o., male  PCP: [name], MD Referring Physician: Shelly Coss, MD  Reason for Consultation: Establishing goals of care  HPI/Patient Profile: 71 y.o. male  with past medical history of stroke, chronic inflammatory demyelinating polyneuropathy, seizures, dysphagia, bradycardia - second degree AV block s/p Medtronic pacemaker placement, hyperlipidemia, CKD stage 3, renal mass, GERD, osteoarthritis admitted on 10/07/2021 with coffee ground emesis requiring transfusion with suspected rectosigmoid impaction. Hospitalization complicated by suspected aspiration pneumonia and concern for status epilepticus as well as acute on chronic kidney failure.   Clinical Assessment and Goals of Care: I met today at Darrell [name] bedside along with wife and daughter. Darrell [name] is noted to be much improved today per himself and family at bedside. They are very appreciative of the medical team and the communication they have had from the team. They feel that Darrell [name] has received good care and are very happy to see him much improved from yesterday. We did discuss the next step of ensuring he remains awake and alert able to tolerate intake. He has just been approved for diet by SLP but has had no intake yet. Discussed the importance of close monitoring for tolerance of intake and risk of aspiration as well as amount of intake. They do verify that he would NOT want a feeding tube if needed (although would need more discussion regarding what this would mean and look like as swallow function worsens).   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 They are very connected with neurologist Dr. Tillman Abide. They have good  understanding of his underlying conditions and wife takes very good care of him in their home. They have one daughter. They have elected DNR status this hospitalization. With his improvement in mentation today family are very hopeful that he will continue to improve and hopeful he can return close to baseline and back home with family. At this time they wish to remain hopeful and were overwhelmed by how quickly they felt his was declining yesterday and fear he was approaching end of life. Today they have renewed hope but also acknowledging their faith and that only God knows when it is our time. Educated that I will continue to follow and support during his journey and have continued conversations.   All questions/concerns addressed. Emotional support provided. Would like to update MOST form during this hospital stay but will follow for progress over next couple days first.   Primary Decision Maker PATIENT    SUMMARY OF RECOMMENDATIONS   - Ongoing palliative care support and conversations - Hopeful for continued improvement  Code Status/Advance Care Planning: DNR   Symptom Management:  Per attending, neurology, nephrology, GI.   Palliative Prophylaxis:  Aspiration, Bowel Regimen, Delirium Protocol, Frequent Pain Assessment, Oral Care, and Turn Reposition  Psycho-social/Spiritual:  Desire for further Chaplaincy support:yes Additional Recommendations: Caregiving  Support/Resources and Education on Hospice  Prognosis:  Overall prognosis poor with underlying ALS/CIDP, anemia, AKI.   Discharge Planning: To Be Determined      Primary Diagnoses: Present on Admission:  Coffee ground emesis  Essential hypertension  Prediabetes  Chronic kidney disease, stage 3b ([name])  Glaucoma  GERD (gastroesophageal reflux disease)  Mixed hyperlipidemia  Hyperkalemia  Normocytic anemia  Bigeminal rhythm   I have reviewed the medical record, interviewed the patient and family, and examined the  patient. The following aspects are pertinent.  Past Medical History:  Diagnosis Date   Arthritis    At high risk for falls 08/16/2015   Bradycardia    Chronic kidney disease    stage 3 GFR 30-59 ml/min    CIDP (chronic inflammatory demyelinating polyneuropathy) ([name])    CKD (chronic kidney disease) stage 3, GFR 30-59 ml/min ([name]) 08/16/2015   Dysphagia as late effect of cerebrovascular disease    pts wife states pt has to eat soft foods    Elevated liver enzymes 08/10/2016   GERD (gastroesophageal reflux disease)    Glaucoma    High cholesterol    History of CVA with residual deficit 03/25/2013   Hypertension    Hypertensive retinopathy of both eyes 01/16/2017   Inguinal hernia 03/25/2013   Liver hemangioma 08/14/2016   Neuromuscular disorder ([place])    chronic inflammatory demyelinating polyneuropathy    New onset seizure ([place]) 07/08/2017   seizure 07/14/18   Nuclear sclerosis of both eyes 10/25/2016   Presence of permanent cardiac pacemaker    placed in april 2018   Primary open angle glaucoma of both eyes, indeterminate stage 10/25/2016   Renal mass, right 08/14/2016   Status cardiac pacemaker 01/29/2017   Placed for second degree heart block on 01/16/17 Medtronic Azure XT DR MRI SureScan dual-chamber pacemaker   Stroke ([place])    2011 with residual deficit left sided weakness   Tobacco dependence    Social History   Socioeconomic History   Marital status: Married    Spouse name: [name]   Number of children: 1   Years of education: college   Highest education level: Not on file  Occupational History   Occupation: retired  Tobacco Use   Smoking status: Every Day    Packs/day: 1.00    Years: 40.00    Pack years: 40.00    Types: Cigarettes   Smokeless tobacco: Never   Tobacco comments:    12/13/20 5 a day  Vaping Use   Vaping Use: Never used  Substance and Sexual Activity   Alcohol use: No    Comment: alcohol free for 1 year was drinking 1/2 pint per day    Drug use: No    Sexual activity: Not on file  Other Topics Concern   Not on file  Social History Narrative   09/13/17 Patient lives at home with spouse.   Has 38 yo daughter   No grandchildren   Social Determinants of Radio broadcast assistant Strain: Not on file  Food Insecurity: Not on file  Transportation Needs: Not on file  Physical Activity: Not on file  Stress: Not on file  Social Connections: Not on file   Family History  Problem Relation Age of Onset   Hypertension Mother    Diabetes Sister    Hypertension Sister    Cancer Sister        1 sister   COPD Sister  in 1 sister   Stomach cancer Neg Hx    Colon cancer Neg Hx    Pancreatic cancer Neg Hx    Esophageal cancer Neg Hx    Scheduled Meds:  atorvastatin  40 mg Oral Daily   brimonidine  1 drop Both Eyes Daily   Chlorhexidine Gluconate Cloth  6 each Topical Daily   dorzolamide-timolol  1 drop Both Eyes Daily   glycopyrrolate  1 mg Oral BID   hydrOXYzine  25 mg Oral QHS   latanoprost  1 drop Both Eyes QHS   mometasone-formoterol  2 puff Inhalation BID   ondansetron (ZOFRAN) IV  4 mg Intravenous Once   pantoprazole  40 mg Intravenous Q12H   sodium zirconium cyclosilicate  10 g Per Tube Once   sorbitol, milk of mag, mineral oil, glycerin (SMOG) enema  400 mL Rectal Once   tamsulosin  0.4 mg Oral Daily   umeclidinium bromide  1 puff Inhalation Daily   Continuous Infusions:  ampicillin-sulbactam (UNASYN) IV 3 g (10/08/21 2323)   levETIRAcetam 500 mg (10/08/21 2032)   sodium bicarbonate 150 mEq in D5W infusion 100 mL/hr at 10/09/21 1133   PRN Meds:.acetaminophen **OR** acetaminophen, bisacodyl, guaiFENesin-dextromethorphan, ondansetron **OR** ondansetron (ZOFRAN) IV Allergies  Allergen Reactions   Ace Inhibitors Other (See Comments)    Hyperkalemia   Doxycycline Other (See Comments)    Hiccups, cough, nausea and emesis, elevated liver enzymes, elevated eosinophils, SOB concerning for early DRESS syndrome     Atacand Hct [Candesartan Cilexetil-Hctz] Hives   Shellfish Allergy Hives   Review of Systems  Unable to perform ROS: Acuity of condition   Physical Exam Vitals and nursing note reviewed.  Constitutional:      General: He is not in acute distress.    Appearance: He is ill-appearing.  Cardiovascular:     Rate and Rhythm: Normal rate.  Pulmonary:     Effort: No tachypnea, accessory muscle usage or respiratory distress.     Comments: Strong cough Abdominal:     General: Abdomen is flat.  Neurological:     Mental Status: He is alert.     Comments: He is verbal and answers questions appropriately but with much effort to speak and weak voice    Vital Signs: BP (!) 145/80    Pulse 80    Temp 98 F (36.7 C) (Axillary)    Resp 15    Wt 78.4 kg Comment: from ECHO   SpO2 99%    BMI 21.32 kg/m  Pain Scale: PAINAD   Pain Score: 0-No pain   SpO2: SpO2: 99 % O2 Device:SpO2: 99 % O2 Flow Rate: .O2 Flow Rate (L/min): 4 L/min  IO: Intake/output summary:  Intake/Output Summary (Last 24 hours) at 10/09/2021 1206 Last data filed at 10/09/2021 1055 Gross per 24 hour  Intake 2793.01 ml  Output 2075 ml  Net 718.01 ml    LBM: Last BM Date:  (PTA; coffee ground emesis) Baseline Weight: Weight: 78.4 kg (from ECHO) Most recent weight: Weight: 78.4 kg (from ECHO)     Palliative Assessment/Data:     Time In: 1230 Time Out: 1335 Time Total: 65 min Greater than 50%  of this time was spent counseling and coordinating care related to the above assessment and plan.  Signed by: Vinie Sill, NP Palliative Medicine Team Pager # [phone] (M-F 8a-5p) Team Phone # [phone] (Nights/Weekends)

## 2021-10-09 NOTE — Progress Notes (Addendum)
PROGRESS NOTE    Darrell [name]  OFB:[id number] DOB: 1951-03-21 DOA: 10/07/2021 PCP: [name], MD   Chief Complain: Coffee-ground emesis  Brief Narrative: Patient is a 71 year old male with history of osteoarthritis, CKD stage IIIb, dysphagia, GERD, hyperlipidemia, nonhemorrhagic CVA, hypertension, glaucoma, right renal mass, tobacco dependence, ALS mostly bedbound, seizure disorder who presents to the emergency department for the evaluation of coffee-ground emesis.  Lab work showed elevated leukocytes, acute kidney injury, hyperkalemia.  CT abdomen showed constipation/rectal impaction.  Chest x-ray was consistent with aspiration pneumonia.  On presentation, he was confused, there was concern for status epilepticus and he was transferred to [facility] from [facility].  He was also hypoxic needing nonrebreather.  PCCM, neurology, nephrology was consulted on admission.  Palliative care also consulted for goals of care, poor prognosis.   Goal of the family is to to take him home  Assessment & Plan:   Principal Problem:   Coffee ground emesis Active Problems:   Essential hypertension   Prediabetes   Chronic kidney disease, stage 3b ([name])   Glaucoma   GERD (gastroesophageal reflux disease)   Mixed hyperlipidemia   Hyperkalemia   Normocytic anemia   Bigeminal rhythm   Acute hypoxic respiratory failure: Presented with tachypnea, leukocytosis, fever.  Aspiration pneumonia suspected.  Continue current antibiotics on unasyn.  On room air today  Acute blood loss anemia/coffee-ground emesis: On PPI twice daily.  GI not planning for endoscopic intervention.  FOBT negative.  Hemoglobin dropped to the range of 6.3 from 10.7 .  Status post 2 units  of blood transfusion.  Continue monitor H&H.  No clear evidence of GI bleed  Altered mental status: Multifactorial secondary to toxic metabolic encephalopathy from sepsis from pneumonia versus ongoing seizures versus uremic encephalopathy.   CT head on  presentation did not show any acute intracranial abnormalities but showed advanced chronic ischemic injury, brain atrophy.Marland Kitchen  MRI could not be done due to presence of pacemaker.  CT angiogram of the brain could not be done due to worsening renal function. Mental status has improved.  He has dementia at baseline and is not oriented to time.  He is following commands and communicates and says he wants to go home. NG tube has been placed because he has poor oral intake, speech therapy consulted  Seizure disorder/concern for status epilepticus:History of seizure disorder.  Suspicion for status epilepticus.   . EEG showed generalized slowing consistent with encephalopathy, cortical dysfunction arising from left frontotemporal region.  24-hour video EEG negative for any seizures. Takes Keppra at home.  Continue Keppra at current dose.  Neurology signed off  AKI on CKD stage IIIb/hyperkalemia/metabolic acidosis: Baseline creatinine ranges from 1.7-2.7.  Presented with AKI with creatinine in the range of 3.  Looked euvolemic on presentation.  CT abdomen did not show any renal obstruction.  Foley placed.  Started on bicarb drip.  Not a candidate for dialysis due to several comorbidities.  Nephrology following.  Monitor kidney function.  Avoid nephrotoxins. Hyperkalemic on presentation, given IV insulin/calcium gluconate, albuterol, Lokelma.  Potassium still in the range of 6 today.  Continue monitoring Patient has history of right renal mass, now measuring up to 2.6 cm with possibility of renal cell carcinoma.  Hypertension: Currently normotensive.  Takes Norvasc and metoprolol at home.  Monitor blood pressure.  Norvasc on hold.  Continue as needed medications for severe hypertension  History of hyperlipidemia: Takes Lipitor at home.  History of glaucoma: On Alphagan and Cosopt drops  Constipation/fecal impaction: Continue  bowel regimen.  Started on enema therapy.    Patient is status post pacemaker  placement.  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  Basically bedbound.  Extremely poor prognosis.  Also has suspected renal mass, worsening kidney function. CODE STATUS is DNR. Palliative care consulted.  Wife is not ready to transition his care to comfort her hospice at home.  Her goal is to take him home          DVT prophylaxis:SCD Code Status:  Family Communication: Wife at bedside Patient status:  Dispo: The patient is from: Home              Anticipated d/c is to: Home              Anticipated d/c date is: Not sure  Consultants: Neurology, PCCM, GI, nephrology  Procedures: EEG  Antimicrobials:  Anti-infectives (From admission, onward)    Start     Dose/Rate Route Frequency Ordered Stop   10/08/21 1245  Ampicillin-Sulbactam (UNASYN) 3 g in sodium chloride 0.9 % 100 mL IVPB        3 g 200 mL/hr over 30 Minutes Intravenous Every 12 hours 10/08/21 1243     10/07/21 1400  cefTRIAXone (ROCEPHIN) 1 g in sodium chloride 0.9 % 100 mL IVPB        1 g 200 mL/hr over 30 Minutes Intravenous  Once 10/07/21 1357 10/07/21 1539   10/07/21 1400  azithromycin (ZITHROMAX) 500 mg in sodium chloride 0.9 % 250 mL IVPB        500 mg 250 mL/hr over 60 Minutes Intravenous Every 24 hours 10/07/21 1357         Subjective: Patient seen and examined at the bedside this morning.  Hemodynamically stable.  Wife is at bedside.  He was hooked up with EEG leads.  His mental status seems to have improved, he follows commands and speaks but not oriented to time.  He was on room air.  Not in any distress.  Objective: Vitals:   10/08/21 1930 10/08/21 2113 10/08/21 2327 10/09/21 0349  BP: 102/60  104/66 131/74  Pulse: 93 88 88 87  Resp: 15 15 17 14   Temp: 97.9 F (36.6 C)  98.7 F (37.1 C) 98.6 F (37 C)  TempSrc: Axillary  Oral Oral  SpO2: 100%  99% 95%  Weight:        Intake/Output Summary (Last  24 hours) at 10/09/2021 0739 Last data filed at 10/09/2021 0600 Gross per 24 hour  Intake 2478.01 ml  Output 2075 ml  Net 403.01 ml   Filed Weights   10/07/21 2000  Weight: 78.4 kg    Examination:  General exam: Very deconditioned, chronically ill looking, weak HEENT: PERRL Respiratory system:  no wheezes or crackles  Cardiovascular system: S1 & S2 heard, RRR.  Gastrointestinal system: Abdomen is nondistended, soft and nontender. Central nervous system: Alert and awake but not oriented, follows commands, generalized weakness Extremities: No edema, no clubbing ,no cyanosis Skin: No rashes, no ulcers,no icterus      Data Reviewed: I have personally reviewed following labs and imaging studies  CBC: Recent Labs  Lab 10/07/21 1124 10/08/21 1449 10/08/21 2021 10/09/21 0322  WBC 13.4* 10.9*  --   --   NEUTROABS 11.1*  --   --   --   HGB 10.7* 7.4* 7.0* 6.3*  HCT 34.4* 23.6* 22.2* 20.4*  MCV 95.3 96.3  --   --  PLT 217 159  --   --    Basic Metabolic Panel: Recent Labs  Lab 10/07/21 1124 10/07/21 1243 10/08/21 0515 10/08/21 0858 10/08/21 1449 10/08/21 2021 10/08/21 2315 10/09/21 0322  NA 138  --  141  --  141 141  --  141  K 6.2*   < > 6.7* 6.1* 6.8* 6.2* 6.8* 6.0*  CL 107  --  116*  --  115* 115*  --  116*  CO2 25  --  19*  --  17* 17*  --  18*  GLUCOSE 164*  --  161*  --  120* 172*  --  267*  BUN 69*  --  122*  --  130* 129*  --  126*  CREATININE 2.57*  --  2.94*  --  3.47* 3.43*  --  3.50*  CALCIUM 9.6  --  9.0  --  9.1 9.3  --  9.1  PHOS  --   --   --   --   --   --   --  2.6   < > = values in this interval not displayed.   GFR: Estimated Creatinine Clearance: 22.1 mL/min (A) (by C-G formula based on SCr of 3.5 mg/dL (H)). Liver Function Tests: Recent Labs  Lab 10/07/21 1124 10/08/21 0515 10/09/21 0322  AST 14* 10*  --   ALT 14 11  --   ALKPHOS 94 58  --   BILITOT 0.6 0.5  --   PROT 6.4* 5.2*  --   ALBUMIN 3.4* 2.9* 2.4*   Recent Labs  Lab  10/07/21 1124  LIPASE 55*   Recent Labs  Lab 10/09/21 0322  AMMONIA 27   Coagulation Profile: No results for input(s): INR, PROTIME in the last 168 hours. Cardiac Enzymes: No results for input(s): CKTOTAL, CKMB, CKMBINDEX, TROPONINI in the last 168 hours. BNP (last 3 results) No results for input(s): PROBNP in the last 8760 hours. HbA1C: No results for input(s): HGBA1C in the last 72 hours. CBG: Recent Labs  Lab 10/08/21 0821 10/08/21 1801 10/08/21 2019 10/08/21 2126 10/08/21 2323  GLUCAP 206* 119* 159* 137* 142*   Lipid Profile: No results for input(s): CHOL, HDL, LDLCALC, TRIG, CHOLHDL, LDLDIRECT in the last 72 hours. Thyroid Function Tests: No results for input(s): TSH, T4TOTAL, FREET4, T3FREE, THYROIDAB in the last 72 hours. Anemia Panel: No results for input(s): VITAMINB12, FOLATE, FERRITIN, TIBC, IRON, RETICCTPCT in the last 72 hours. Sepsis Labs: No results for input(s): PROCALCITON, LATICACIDVEN in the last 168 hours.  Recent Results (from the past 240 hour(s))  Resp Panel by RT-PCR (Flu A&B, Covid) Nasopharyngeal Swab     Status: None   Collection Time: 10/07/21 11:25 AM   Specimen: Nasopharyngeal Swab; Nasopharyngeal(NP) swabs in vial transport medium  Result Value Ref Range Status   SARS Coronavirus 2 by RT PCR NEGATIVE NEGATIVE Final    Comment: (NOTE) SARS-CoV-2 target nucleic acids are NOT DETECTED.  The SARS-CoV-2 RNA is generally detectable in upper respiratory specimens during the acute phase of infection. The lowest concentration of SARS-CoV-2 viral copies this assay can detect is 138 copies/mL. A negative result does not preclude SARS-Cov-2 infection and should not be used as the sole basis for treatment or other patient management decisions. A negative result may occur with  improper specimen collection/handling, submission of specimen other than nasopharyngeal swab, presence of viral mutation(s) within the areas targeted by this assay, and  inadequate number of viral copies(<138 copies/mL). A negative result must be combined  with clinical observations, patient history, and epidemiological information. The expected result is Negative.  Fact Sheet for Patients:  EntrepreneurPulse.com.au  Fact Sheet for Healthcare Providers:  IncredibleEmployment.be  This test is no t yet approved or cleared by the [place] FDA and  has been authorized for detection and/or diagnosis of SARS-CoV-2 by FDA under an Emergency Use Authorization (EUA). This EUA will remain  in effect (meaning this test can be used) for the duration of the COVID-19 declaration under Section 564(b)(1) of the Act, 21 U.S.C.section 360bbb-3(b)(1), unless the authorization is terminated  or revoked sooner.       Influenza A by PCR NEGATIVE NEGATIVE Final   Influenza B by PCR NEGATIVE NEGATIVE Final    Comment: (NOTE) The Xpert Xpress SARS-CoV-2/FLU/RSV plus assay is intended as an aid in the diagnosis of influenza from Nasopharyngeal swab specimens and should not be used as a sole basis for treatment. Nasal washings and aspirates are unacceptable for Xpert Xpress SARS-CoV-2/FLU/RSV testing.  Fact Sheet for Patients: EntrepreneurPulse.com.au  Fact Sheet for Healthcare Providers: IncredibleEmployment.be  This test is not yet approved or cleared by the [place] FDA and has been authorized for detection and/or diagnosis of SARS-CoV-2 by FDA under an Emergency Use Authorization (EUA). This EUA will remain in effect (meaning this test can be used) for the duration of the COVID-19 declaration under Section 564(b)(1) of the Act, 21 U.S.C. section 360bbb-3(b)(1), unless the authorization is terminated or revoked.  Performed at [facility], [place] [address] [place], [place] [address]   Resp Panel by RT-PCR (Flu A&B, Covid) Nasopharyngeal Swab     Status: None    Collection Time: 10/08/21  4:41 PM   Specimen: Nasopharyngeal Swab; Nasopharyngeal(NP) swabs in vial transport medium  Result Value Ref Range Status   SARS Coronavirus 2 by RT PCR NEGATIVE NEGATIVE Final    Comment: (NOTE) SARS-CoV-2 target nucleic acids are NOT DETECTED.  The SARS-CoV-2 RNA is generally detectable in upper respiratory specimens during the acute phase of infection. The lowest concentration of SARS-CoV-2 viral copies this assay can detect is 138 copies/mL. A negative result does not preclude SARS-Cov-2 infection and should not be used as the sole basis for treatment or other patient management decisions. A negative result may occur with  improper specimen collection/handling, submission of specimen other than nasopharyngeal swab, presence of viral mutation(s) within the areas targeted by this assay, and inadequate number of viral copies(<138 copies/mL). A negative result must be combined with clinical observations, patient history, and epidemiological information. The expected result is Negative.  Fact Sheet for Patients:  EntrepreneurPulse.com.au  Fact Sheet for Healthcare Providers:  IncredibleEmployment.be  This test is no t yet approved or cleared by the [place] FDA and  has been authorized for detection and/or diagnosis of SARS-CoV-2 by FDA under an Emergency Use Authorization (EUA). This EUA will remain  in effect (meaning this test can be used) for the duration of the COVID-19 declaration under Section 564(b)(1) of the Act, 21 U.S.C.section 360bbb-3(b)(1), unless the authorization is terminated  or revoked sooner.       Influenza A by PCR NEGATIVE NEGATIVE Final   Influenza B by PCR NEGATIVE NEGATIVE Final    Comment: (NOTE) The Xpert Xpress SARS-CoV-2/FLU/RSV plus assay is intended as an aid in the diagnosis of influenza from Nasopharyngeal swab specimens and should not be used as a sole basis for treatment.  Nasal washings and aspirates are unacceptable for Xpert Xpress SARS-CoV-2/FLU/RSV testing.  Fact Sheet for  Patients: EntrepreneurPulse.com.au  Fact Sheet for Healthcare Providers: IncredibleEmployment.be  This test is not yet approved or cleared by the [place] FDA and has been authorized for detection and/or diagnosis of SARS-CoV-2 by FDA under an Emergency Use Authorization (EUA). This EUA will remain in effect (meaning this test can be used) for the duration of the COVID-19 declaration under Section 564(b)(1) of the Act, 21 U.S.C. section 360bbb-3(b)(1), unless the authorization is terminated or revoked.  Performed at [facility], [place] [address] [place], [place] [address]          Radiology Studies: CT ABDOMEN PELVIS WO CONTRAST  Result Date: 10/07/2021 CLINICAL DATA:  Hematemesis.  Denies abdominal pain. EXAM: CT ABDOMEN AND PELVIS WITHOUT CONTRAST TECHNIQUE: Multidetector CT imaging of the abdomen and pelvis was performed following the standard protocol without IV contrast. COMPARISON:  CT abdomen and pelvis dated May 05, 2019. FINDINGS: Lower chest: No acute abnormality. Scarring and atelectasis in the lingula and left-greater-than-right lower lobes. Hepatobiliary: Unchanged 2.5 cm low-density lesion in the anterior left hepatic lobe, previously characterized as a hemangioma. No new focal liver abnormality. The gallbladder is unremarkable. No biliary dilatation. Pancreas: Unremarkable. No pancreatic ductal dilatation or surrounding inflammatory changes. Spleen: Normal in size without focal abnormality. Adrenals/Urinary Tract: Adrenal glands are unremarkable. Enlarging now 2.6 cm indeterminate right renal anterior upper pole lesion, slowly increasing in size since 2017. Additional bilateral simple and complex renal cysts are not significantly changed. No renal calculi or hydronephrosis. Bladder is unremarkable. Stomach/Bowel:  Distended distal esophagus and stomach. No bowel wall thickening, distention, or surrounding inflammatory changes. Large rectosigmoid stool ball. Normal appendix. Vascular/Lymphatic: Aortic atherosclerosis. No enlarged abdominal or pelvic lymph nodes. Reproductive: Prostate is unremarkable. Other: No free fluid or pneumoperitoneum. Musculoskeletal: No acute or significant osseous findings. IMPRESSION: 1. Distended distal esophagus and stomach without obstruction. 2. Large rectosigmoid stool ball. Correlate for fecal impaction. 3. Enlarging now 2.6 cm indeterminate right renal anterior upper pole lesion, slowly increasing in size since 2017. Renal cell carcinoma is not excluded. Recommend non-emergent outpatient renal protocol MRI with and without contrast for further evaluation. 4. Aortic Atherosclerosis (ICD10-I70.0). Electronically Signed   By: Titus Dubin M.D.   On: 10/07/2021 13:24   DG Abdomen 1 View  Result Date: 10/07/2021 CLINICAL DATA:  Coffee-ground emesis EXAM: ABDOMEN - 1 VIEW COMPARISON:  Portable exam 1205 hours compared to CT abdomen and pelvis 08/11/2016 FINDINGS: Increased stool in rectum. Gas and normal stool throughout remainder of colon. Gaseous distention of stomach. Small bowel gas pattern normal. No bowel dilatation or wall thickening otherwise identified. Bones demineralized. IMPRESSION: Increased stool in rectum Electronically Signed   By: Lavonia Dana M.D.   On: 10/07/2021 12:24   DG Chest Port 1 View  Result Date: 10/08/2021 CLINICAL DATA:  Shortness of breath and hematemesis. EXAM: PORTABLE CHEST 1 VIEW COMPARISON:  October 07, 2021 FINDINGS: Cardiomediastinal silhouette is mildly enlarged. Cardiac pacemaker unchanged. Streaky perihilar opacities and possible more confluent airspace consolidation in the left lung base persist. Additional streaky airspace consolidation the right lung base noted. Osseous structures are without acute abnormality. Soft tissues are grossly normal.  IMPRESSION: Persistent streaky perihilar opacities and possible more confluent airspace consolidation in the bilateral lung bases. Electronically Signed   By: Fidela Salisbury M.D.   On: 10/08/2021 13:05   DG Chest Portable 1 View  Result Date: 10/07/2021 CLINICAL DATA:  Vomiting, coffee-ground emesis, increased weakness EXAM: PORTABLE CHEST 1 VIEW COMPARISON:  Portable exam 1203 hours compared to  05/23/2021 FINDINGS: LEFT subclavian sequential transvenous pacemaker leads project at RIGHT atrium and RIGHT ventricle. Normal heart size, mediastinal contours, and pulmonary vascularity. Atherosclerotic calcification aorta. Rotated to the LEFT. Mild bibasilar atelectasis versus infiltrate. Remaining lungs clear. No pleural effusion or pneumothorax. Bones demineralized. IMPRESSION: Bibasilar atelectasis versus infiltrate. Electronically Signed   By: Lavonia Dana M.D.   On: 10/07/2021 12:22   DG Abd Portable 1V  Result Date: 10/09/2021 CLINICAL DATA:  Check gastric catheter placement EXAM: PORTABLE ABDOMEN - 1 VIEW COMPARISON:  10/07/2021 FINDINGS: Gastric catheter is noted within the stomach. Pacing device is seen. Cardiac shadow is stable. No free air is noted. IMPRESSION: Gastric catheter within the stomach. Electronically Signed   By: Inez Catalina M.D.   On: 10/09/2021 03:42   EEG adult  Result Date: 10/08/2021 Lora Havens, MD     10/08/2021 12:06 PM Patient Name: Darrell [name] MRN: [id number] Epilepsy Attending: Lora Havens Referring Physician/Provider: Ida Rogue, MD Duration: 10/08/2021 0749 to 1051 Patient history: 70yo M with ams and right foot twitching. Rapid eeg to evaluate for status epilepticus. Level of alertness: lethargic AEDs during EEG study: LEV Technical aspects: This EEG was obtained using a 10 lead EEG system positioned circumferentially without any parasagittal coverage (rapid EEG). Computer selected EEG is reviewed as  well as background features and all clinically  significant events. Description: EEG showed continuous generalized polymorphic 3 to 6 Hz theta-delta slowing admixed with 14-16hz  frontocentral beta activity. Intermittent sharply contoured 3-5Hz  theta-delta slowing was also seen in left frontotemporal region. Sharp transient was seen in left temporal region. Patient was noted to have right leg twitching per RN. However, no eeg change was seen suggestive of seizure. Hyperventilation and photic stimulation were not performed.   ABNORMALITY - Intermittent slow, left fronto-temporal region - Continuous slow, generalized IMPRESSION: This limited ceribell EEG is suggestive of cortical dysfunction arising from left frontotemporal region, nonspecific etiology but could be secondary to underlying prior stroke, ictal/post-ictal state. . Additionally, this study is suggestive of moderate to severe diffuse encephalopathy, nonspecific etiology. RN reported right leg twitching during the study without concomitant eeg change. However, focal motor seizures may not be seen on scalp eeg. Therefore, consider video eeg and clinical correlation. Priyanka [name]   Overnight EEG with video  Result Date: 10/08/2021 Tsosie Billing, MD     10/08/2021  6:43 PM [facility] Tele[facility] STAT LTM Read: 10/08/2021 15:37 - 10/08/2021 18:41 Patient Name:   [name] Date of Birth:   1951-06-27 Identification Number:   MRN - [id number] Indication: Encephalopathy, Technical Summary: A routine 20 channel electroencephalogram using the international 10-20 system of electrode placement was performed. Background: 5-6 Hz, Poorly formed States      Awake Abnormalities Generalized Slowing: Diffuse generalized slowing Background Slowing: The background consists of 20-50 uV, 5-6 Hz diffuse activity with superimposed diffuse polymorphic delta activity that is non reactive to external stimulation. Activation Procedures Hyperventilation: Not performed Photic  Stimulation: Classification: Abnormal : Diagnosis: This is abnormal EEG, the Presence of Generalized slowing is consistent with Encephalopathy Dr Tsosie Billing [facility] [phone] Case [id number]  CT HEAD CODE STROKE WO CONTRAST`  Result Date: 10/08/2021 CLINICAL DATA:  Code stroke. EXAM: CT HEAD WITHOUT CONTRAST TECHNIQUE: Contiguous axial images were obtained from the base of the skull through the vertex without intravenous contrast. RADIATION DOSE REDUCTION: This exam was performed according to the departmental dose-optimization program which includes automated exposure control, adjustment of the mA and/or kV according to  patient size and/or use of iterative reconstruction technique. COMPARISON:  05/06/2021 FINDINGS: Brain: No evidence of acute infarction, hemorrhage, hydrocephalus, extra-axial collection or mass lesion/mass effect. Advanced chronic small vessel ischemia with confluent gliosis and multiple small vessel infarcts including in the right thalamus and right basal ganglia. 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 Small chronic right cerebellar infarct. Premature brain atrophy which is generalized Vascular: No hyperdense vessel or unexpected calcification. Skull: Normal. Negative for fracture or focal lesion. Sinuses/Orbits: No acute finding. Other: Attempted text communication to [facility] line in amion at 7:04 am on 10/08/2021. ASPECTS [facility] Stroke Program Early CT Score) Not scored without localizing symptom. IMPRESSION: 1. No acute finding 2. Advanced chronic ischemic injury and brain atrophy. Electronically Signed   By: Jorje Guild M.D.   On: 10/08/2021 07:04        Scheduled Meds:  sodium chloride   Intravenous Once   atorvastatin  40 mg Oral Daily   brimonidine  1 drop Both Eyes Daily   Chlorhexidine Gluconate Cloth  6 each Topical Daily   dorzolamide-timolol  1 drop Both Eyes Daily   glycopyrrolate  1 mg Oral BID    hydrOXYzine  25 mg Oral QHS   latanoprost  1 drop Both Eyes QHS   mometasone-formoterol  2 puff Inhalation BID   ondansetron (ZOFRAN) IV  4 mg Intravenous Once   pantoprazole  40 mg Intravenous Q12H   sodium zirconium cyclosilicate  10 g Per Tube Once   sorbitol, milk of mag, mineral oil, glycerin (SMOG) enema  400 mL Rectal Once   tamsulosin  0.4 mg Oral Daily   umeclidinium bromide  1 puff Inhalation Daily   Continuous Infusions:  ampicillin-sulbactam (UNASYN) IV 3 g (10/08/21 2323)   azithromycin 250 mL/hr at 10/08/21 1854   levETIRAcetam 500 mg (10/08/21 2032)   octreotide  (SANDOSTATIN)    IV infusion 50 mcg/hr (10/09/21 0716)   sodium bicarbonate 150 mEq in D5W infusion 100 mL/hr at 10/08/21 2320     LOS: 1 day         Shelly Coss, MD [facility] P1/15/2023, 7:39 AM

## 2021-10-09 NOTE — Progress Notes (Signed)
Patient Name: Darrell [name] Date of Encounter: 10/09/2021, 8:32 AM    Subjective  Initially seen due to coffee ground emesis x 1 and also had rectosigmoid impaction suspected - did not get ordered SMOG enema and has not vomited since admit - NGT in as he has flunked swallow eval He then had altered mental status, strok, SZ activity and was sent from [facility] to [facility] Darrell [name] He is alert now, no pain - wife at bedside Has had problems w/ hyperkalemia and AKI and HGb has dropped from 10 to 6.3 but no evidence of any bleeding GI or other. Somehow ended up on octreotide - no liver dz?   Objective  BP 125/74 (BP Location: Right Arm)    Pulse 83    Temp 98.2 F (36.8 C) (Axillary)    Resp 15    Wt 78.4 kg Comment: from ECHO   SpO2 95%    BMI 21.32 kg/m  Acutely and chrinicall ill  NGT in Abd soft and NT  CBC Latest Ref Rng & Units 10/09/2021 10/08/2021 10/08/2021  WBC 4.0 - 10.5 K/uL - - 10.9(H)  Hemoglobin 13.0 - 17.0 g/dL 6.3(LL) 7.0(L) 7.4(L)  Hematocrit 39.0 - 52.0 % 20.4(L) 22.2(L) 23.6(L)  Platelets 150 - 400 K/uL - - 159   Recent Labs  Lab 10/07/21 1124 10/07/21 1243 10/08/21 0515 10/08/21 0858 10/08/21 1449 10/08/21 2021 10/08/21 2315 10/09/21 0322  NA 138  --  141  --  141 141  --  141  K 6.2*   < > 6.7*   < > 6.8* 6.2* 6.8* 6.0*  CL 107  --  116*  --  115* 115*  --  116*  CO2 25  --  19*  --  17* 17*  --  18*  GLUCOSE 164*  --  161*  --  120* 172*  --  267*  BUN 69*  --  122*  --  130* 129*  --  126*  CREATININE 2.57*  --  2.94*  --  3.47* 3.43*  --  3.50*  CALCIUM 9.6  --  9.0  --  9.1 9.3  --  9.1  PHOS  --   --   --   --   --   --   --  2.6   < > = values in this interval not displayed.   Recent Labs  Lab 10/07/21 1124 10/08/21 0515 10/09/21 0322  AST 14* 10*  --   ALT 14 11  --   ALKPHOS 94 58  --   BILITOT 0.6 0.5  --   PROT 6.4* 5.2*  --   ALBUMIN 3.4* 2.9* 2.4*    NH3 NL   Assessment and Plan  Decreased HGB in setting of coffee ground emesis x 1 PTA but  none since and no signs GI bleed  Constipation w/ rectosigmoid "stool ball" on CT  Neurologic deterioration/stroke(s)? Metabolic encephalopathy and baseline chronic inflammatory demyelinating polyneuropathy  AKI w// persistent sig hyperkalemia    I still do not suspect a major GI bleed based upon what we know though it remains a ossibility there is nothing to suggest an ongoing GI bleed Hgb decrease can be seen w/ AKI and hydration DC Octreotide Stay on PPI Give SMOG enema Transfuse as you are No role for endoscopic eval currently and not planning unless something changes  We will f/u tomorrow         Darrell Mayer, MD, [facility] 10/09/2021 8:32 AM

## 2021-10-10 DIAGNOSIS — N1832 Chronic kidney disease, stage 3b: Secondary | ICD-10-CM

## 2021-10-10 DIAGNOSIS — J9601 Acute respiratory failure with hypoxia: Secondary | ICD-10-CM | POA: Diagnosis not present

## 2021-10-10 DIAGNOSIS — K5641 Fecal impaction: Secondary | ICD-10-CM

## 2021-10-10 DIAGNOSIS — K219 Gastro-esophageal reflux disease without esophagitis: Secondary | ICD-10-CM | POA: Diagnosis not present

## 2021-10-10 DIAGNOSIS — Z20822 Contact with and (suspected) exposure to covid-19: Secondary | ICD-10-CM | POA: Diagnosis not present

## 2021-10-10 DIAGNOSIS — G6181 Chronic inflammatory demyelinating polyneuritis: Secondary | ICD-10-CM | POA: Diagnosis not present

## 2021-10-10 DIAGNOSIS — R0602 Shortness of breath: Secondary | ICD-10-CM | POA: Diagnosis not present

## 2021-10-10 DIAGNOSIS — I129 Hypertensive chronic kidney disease with stage 1 through stage 4 chronic kidney disease, or unspecified chronic kidney disease: Secondary | ICD-10-CM | POA: Diagnosis not present

## 2021-10-10 DIAGNOSIS — E669 Obesity, unspecified: Secondary | ICD-10-CM | POA: Diagnosis present

## 2021-10-10 DIAGNOSIS — Z515 Encounter for palliative care: Secondary | ICD-10-CM | POA: Diagnosis not present

## 2021-10-10 DIAGNOSIS — H543 Unqualified visual loss, both eyes: Secondary | ICD-10-CM | POA: Diagnosis present

## 2021-10-10 DIAGNOSIS — F17210 Nicotine dependence, cigarettes, uncomplicated: Secondary | ICD-10-CM | POA: Diagnosis present

## 2021-10-10 DIAGNOSIS — I69354 Hemiplegia and hemiparesis following cerebral infarction affecting left non-dominant side: Secondary | ICD-10-CM | POA: Diagnosis not present

## 2021-10-10 DIAGNOSIS — R6520 Severe sepsis without septic shock: Secondary | ICD-10-CM | POA: Diagnosis present

## 2021-10-10 DIAGNOSIS — J690 Pneumonitis due to inhalation of food and vomit: Secondary | ICD-10-CM | POA: Diagnosis not present

## 2021-10-10 DIAGNOSIS — Z66 Do not resuscitate: Secondary | ICD-10-CM | POA: Diagnosis not present

## 2021-10-10 DIAGNOSIS — D62 Acute posthemorrhagic anemia: Secondary | ICD-10-CM | POA: Diagnosis present

## 2021-10-10 DIAGNOSIS — K920 Hematemesis: Secondary | ICD-10-CM | POA: Diagnosis not present

## 2021-10-10 DIAGNOSIS — E782 Mixed hyperlipidemia: Secondary | ICD-10-CM | POA: Diagnosis present

## 2021-10-10 DIAGNOSIS — E872 Acidosis, unspecified: Secondary | ICD-10-CM | POA: Diagnosis not present

## 2021-10-10 DIAGNOSIS — K5649 Other impaction of intestine: Secondary | ICD-10-CM | POA: Diagnosis present

## 2021-10-10 DIAGNOSIS — A419 Sepsis, unspecified organism: Secondary | ICD-10-CM | POA: Diagnosis not present

## 2021-10-10 DIAGNOSIS — Z7189 Other specified counseling: Secondary | ICD-10-CM | POA: Diagnosis not present

## 2021-10-10 DIAGNOSIS — F0150 Vascular dementia without behavioral disturbance: Secondary | ICD-10-CM | POA: Diagnosis present

## 2021-10-10 DIAGNOSIS — N179 Acute kidney failure, unspecified: Secondary | ICD-10-CM | POA: Diagnosis not present

## 2021-10-10 DIAGNOSIS — E875 Hyperkalemia: Secondary | ICD-10-CM | POA: Diagnosis not present

## 2021-10-10 DIAGNOSIS — G40201 Localization-related (focal) (partial) symptomatic epilepsy and epileptic syndromes with complex partial seizures, not intractable, with status epilepticus: Secondary | ICD-10-CM | POA: Diagnosis not present

## 2021-10-10 DIAGNOSIS — G928 Other toxic encephalopathy: Secondary | ICD-10-CM | POA: Diagnosis not present

## 2021-10-10 DIAGNOSIS — D649 Anemia, unspecified: Secondary | ICD-10-CM | POA: Diagnosis not present

## 2021-10-10 DIAGNOSIS — J189 Pneumonia, unspecified organism: Secondary | ICD-10-CM | POA: Diagnosis not present

## 2021-10-10 LAB — RENAL FUNCTION PANEL
Albumin: 2.5 g/dL — ABNORMAL LOW (ref 3.5–5.0)
Anion gap: 10 (ref 5–15)
BUN: 97 mg/dL — ABNORMAL HIGH (ref 8–23)
CO2: 23 mmol/L (ref 22–32)
Calcium: 9.4 mg/dL (ref 8.9–10.3)
Chloride: 113 mmol/L — ABNORMAL HIGH (ref 98–111)
Creatinine, Ser: 2.97 mg/dL — ABNORMAL HIGH (ref 0.61–1.24)
GFR, Estimated: 22 mL/min — ABNORMAL LOW (ref >60)
Glucose, Bld: 116 mg/dL — ABNORMAL HIGH (ref 70–99)
Phosphorus: 2.9 mg/dL (ref 2.5–4.6)
Potassium: 4.9 mmol/L (ref 3.5–5.1)
Sodium: 146 mmol/L — ABNORMAL HIGH (ref 135–145)

## 2021-10-10 LAB — BASIC METABOLIC PANEL
Anion gap: 9 (ref 5–15)
BUN: 89 mg/dL — ABNORMAL HIGH (ref 8–23)
CO2: 26 mmol/L (ref 22–32)
Calcium: 9.4 mg/dL (ref 8.9–10.3)
Chloride: 112 mmol/L — ABNORMAL HIGH (ref 98–111)
Creatinine, Ser: 2.9 mg/dL — ABNORMAL HIGH (ref 0.61–1.24)
GFR, Estimated: 23 mL/min — ABNORMAL LOW (ref >60)
Glucose, Bld: 125 mg/dL — ABNORMAL HIGH (ref 70–99)
Potassium: 4.4 mmol/L (ref 3.5–5.1)
Sodium: 147 mmol/L — ABNORMAL HIGH (ref 135–145)

## 2021-10-10 LAB — URINALYSIS, ROUTINE W REFLEX MICROSCOPIC
Bilirubin Urine: NEGATIVE
Glucose, UA: NEGATIVE mg/dL
Ketones, ur: NEGATIVE mg/dL
Nitrite: NEGATIVE
Protein, ur: NEGATIVE mg/dL
Specific Gravity, Urine: 1.01 (ref 1.005–1.030)
pH: 6 (ref 5.0–8.0)

## 2021-10-10 LAB — TYPE AND SCREEN
ABO/RH(D): AB POS
Antibody Screen: NEGATIVE
Unit division: 0
Unit division: 0

## 2021-10-10 LAB — CBC WITH DIFFERENTIAL/PLATELET
Abs Immature Granulocytes: 0.05 K/uL (ref 0.00–0.07)
Basophils Absolute: 0 K/uL (ref 0.0–0.1)
Basophils Relative: 0 %
Eosinophils Absolute: 0.3 K/uL (ref 0.0–0.5)
Eosinophils Relative: 4 %
HCT: 26.5 % — ABNORMAL LOW (ref 39.0–52.0)
Hemoglobin: 8.6 g/dL — ABNORMAL LOW (ref 13.0–17.0)
Immature Granulocytes: 1 %
Lymphocytes Relative: 19 %
Lymphs Abs: 1.5 K/uL (ref 0.7–4.0)
MCH: 30.3 pg (ref 26.0–34.0)
MCHC: 32.5 g/dL (ref 30.0–36.0)
MCV: 93.3 fL (ref 80.0–100.0)
Monocytes Absolute: 1 K/uL (ref 0.1–1.0)
Monocytes Relative: 13 %
Neutro Abs: 4.7 K/uL (ref 1.7–7.7)
Neutrophils Relative %: 63 %
Platelets: 145 K/uL — ABNORMAL LOW (ref 150–400)
RBC: 2.84 MIL/uL — ABNORMAL LOW (ref 4.22–5.81)
RDW: 13.5 % (ref 11.5–15.5)
WBC: 7.6 K/uL (ref 4.0–10.5)
nRBC: 0.3 % — ABNORMAL HIGH (ref 0.0–0.2)

## 2021-10-10 LAB — BPAM RBC
Blood Product Expiration Date: 202302082359
Blood Product Expiration Date: 202302082359
ISSUE DATE / TIME: 202301151031
ISSUE DATE / TIME: 202301151543
Unit Type and Rh: 6200
Unit Type and Rh: 6200

## 2021-10-10 LAB — URINALYSIS, MICROSCOPIC (REFLEX): Squamous Epithelial / HPF: NONE SEEN (ref 0–5)

## 2021-10-10 NOTE — Progress Notes (Signed)
PROGRESS NOTE    Darrell [name]  ZOX:[id number] DOB: 1951-07-14 DOA: 10/07/2021 PCP: [name], MD   Chief Complain: Coffee-ground emesis  Brief Narrative: Patient is a 71 year old male with history of osteoarthritis, CKD stage IIIb, dysphagia, GERD, hyperlipidemia, nonhemorrhagic CVA, hypertension, glaucoma, right renal mass, tobacco dependence, ALS mostly bedbound, seizure disorder who presents to the emergency department for the evaluation of coffee-ground emesis.  Lab work showed elevated leukocytes, acute kidney injury, hyperkalemia.  CT abdomen showed constipation/rectal impaction.  Chest x-ray was consistent with aspiration pneumonia.  On presentation, he was confused, there was concern for status epilepticus and he was transferred to [facility] from [facility].  He was also hypoxic needing nonrebreather.  PCCM, neurology, nephrology was consulted on admission.  Palliative care also consulted for goals of care, poor prognosis.   Goal of the family is to to take him home.  Likely discharge tomorrow to home  Assessment & Plan:   Principal Problem:   Coffee ground emesis Active Problems:   Essential hypertension   Prediabetes   Chronic kidney disease, stage 3b ([name])   Glaucoma   GERD (gastroesophageal reflux disease)   Mixed hyperlipidemia   Hyperkalemia   Normocytic anemia   Bigeminal rhythm   Acute respiratory failure with hypoxia ([name])   Acute hypoxic respiratory failure: Presented with tachypnea, leukocytosis, fever.  Aspiration pneumonia suspected.  Continue current antibiotics on unasyn,will change to augmentin on dc.  On room air today  Acute blood loss anemia/coffee-ground emesis: On PPI twice daily.  GI not planning for endoscopic intervention.  FOBT negative.  Hemoglobin dropped to the range of 6.3 from 10.7 .  Status post 2 units  of blood transfusion.  Continue monitor H&H.  No clear evidence of GI bleed.  Hemoglobin stable in the range of 8  Altered mental status:  Multifactorial secondary to toxic metabolic encephalopathy from sepsis from pneumonia versus ongoing seizures versus uremic encephalopathy.   CT head on presentation did not show any acute intracranial abnormalities but showed advanced chronic ischemic injury, brain atrophy.Marland Kitchen  MRI could not be done due to presence of pacemaker.  CT angiogram of the brain could not be done due to worsening renal function. Mental status has improved.  He has dementia at baseline and is not oriented to time.  He is following commands and communicates and says he wants to go home.  Seizure disorder/concern for status epilepticus:History of seizure disorder.  Suspicion for status epilepticus.   . EEG showed generalized slowing consistent with encephalopathy, cortical dysfunction arising from left frontotemporal region.  24-hour video EEG negative for any seizures. Takes Keppra at home.  Continue Keppra at current dose.  Neurology signed off  AKI on CKD stage IIIb/hyperkalemia/metabolic acidosis: Baseline creatinine ranges from 1.7-2.7.  Presented with AKI with creatinine in the range of 3.  Looked euvolemic on presentation.  CT abdomen did not show any renal obstruction.  Foley placed.  Started on bicarb drip.  Not a candidate for dialysis due to several comorbidities.  Nephrology following.  Monitor kidney function.  Avoid nephrotoxins. Hyperkalemic on presentation, given IV insulin/calcium gluconate, albuterol, Lokelma.  Potassium level is now stable.  Continue monitoring Patient has history of right renal mass, now measuring up to 2.6 cm with possibility of renal cell carcinoma.  Outpatient follow-up recommended with urology.  Hypertension: Currently normotensive.  Takes Norvasc and metoprolol at home.  Monitor blood pressure.  Norvasc on hold.  Continue as needed medications for severe hypertension  History of hyperlipidemia: Takes  Lipitor at home.  History of glaucoma: On Alphagan and Cosopt drops  Constipation/fecal  impaction: Continue bowel regimen.  Started on enema therapy.    Patient is status post pacemaker placement.  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  Basically bedbound.  Extremely poor prognosis.  Also has suspected renal mass, worsening kidney function. CODE STATUS is DNR. Palliative care consulted.  Wife is not ready to transition his care to comfort or hospice at home.  Her goal is to take him home          DVT prophylaxis:SCD Code Status:  Family Communication: daughter at bedside Patient status:  Dispo: The patient is from: Home              Anticipated d/c is to: Home              Anticipated d/c date is: tomorrow  Consultants: Neurology, PCCM, GI, nephrology  Procedures: EEG  Antimicrobials:  Anti-infectives (From admission, onward)    Start     Dose/Rate Route Frequency Ordered Stop   10/08/21 1245  Ampicillin-Sulbactam (UNASYN) 3 g in sodium chloride 0.9 % 100 mL IVPB        3 g 200 mL/hr over 30 Minutes Intravenous Every 12 hours 10/08/21 1243     10/07/21 1400  cefTRIAXone (ROCEPHIN) 1 g in sodium chloride 0.9 % 100 mL IVPB        1 g 200 mL/hr over 30 Minutes Intravenous  Once 10/07/21 1357 10/07/21 1539   10/07/21 1400  azithromycin (ZITHROMAX) 500 mg in sodium chloride 0.9 % 250 mL IVPB  Status:  Discontinued        500 mg 250 mL/hr over 60 Minutes Intravenous Every 24 hours 10/07/21 1357 10/09/21 1112       Subjective: Patient seen and examined at the bedside this morning.  Hemodynamically stable.  Lying on bed.  Daughter at the bedside.  He looks comfortable, alert, awake and coherent and follows commands and communicates well.  He was eager to go home.  Objective: Vitals:   10/09/21 2349 10/10/21 0354 10/10/21 0455 10/10/21 0740  BP: 137/65 127/60  131/72  Pulse: 79 72  72  Resp: 16 15  15   Temp: 97.9 F (36.6 C) 97.9 F (36.6 C)  98.2 F (36.8 C)   TempSrc: Axillary Axillary  Oral  SpO2: 97% 98%  98%  Weight:   78.4 kg     Intake/Output Summary (Last 24 hours) at 10/10/2021 0804 Last data filed at 10/10/2021 6144 Gross per 24 hour  Intake 2516.48 ml  Output 2400 ml  Net 116.48 ml   Filed Weights   10/07/21 2000 10/10/21 0455  Weight: 78.4 kg 78.4 kg    Examination:  General exam: Very deconditioned, chronically ill looking, weak HEENT: PERRL Respiratory system:  no wheezes or crackles  Cardiovascular system: S1 & S2 heard, RRR.  Gastrointestinal system: Abdomen is nondistended, soft and nontender. Central nervous system: Alert and awake, oriented to place, tells correct year, generalized weakness Extremities: No edema, no clubbing ,no cyanosis Skin: No rashes, no ulcers,no icterus    Data Reviewed: I have personally reviewed following labs and imaging studies  CBC: Recent Labs  Lab 10/07/21 1124 10/08/21 1449 10/08/21 2021 10/09/21 0322 10/09/21 1120 10/10/21 0548  WBC 13.4* 10.9*  --   --   --  7.6  NEUTROABS 11.1*  --   --   --   --  4.7  HGB 10.7* 7.4* 7.0* 6.3* 6.2* 8.6*  HCT 34.4* 23.6* 22.2* 20.4* 20.5* 26.5*  MCV 95.3 96.3  --   --   --  93.3  PLT 217 159  --   --   --  109*   Basic Metabolic Panel: Recent Labs  Lab 10/08/21 1449 10/08/21 2021 10/08/21 2315 10/09/21 0322 10/09/21 0957 10/09/21 1525 10/09/21 2113 10/10/21 0125 10/10/21 0548  NA 141 141  --  141  --   --   --  146* 147*  K 6.8* 6.2*   < > 6.0* 5.9* 5.6* 5.3* 4.9 4.4  CL 115* 115*  --  116*  --   --   --  113* 112*  CO2 17* 17*  --  18*  --   --   --  23 26  GLUCOSE 120* 172*  --  267*  --   --   --  116* 125*  BUN 130* 129*  --  126*  --   --   --  97* 89*  CREATININE 3.47* 3.43*  --  3.50*  --   --   --  2.97* 2.90*  CALCIUM 9.1 9.3  --  9.1  --   --   --  9.4 9.4  PHOS  --   --   --  2.6  --   --   --  2.9  --    < > = values in this interval not displayed.   GFR: Estimated Creatinine Clearance: 26.7 mL/min (A) (by  C-G formula based on SCr of 2.9 mg/dL (H)). Liver Function Tests: Recent Labs  Lab 10/07/21 1124 10/08/21 0515 10/09/21 0322 10/09/21 0957 10/10/21 0125  AST 14* 10*  --  9*  --   ALT 14 11  --  10  --   ALKPHOS 94 58  --  47  --   BILITOT 0.6 0.5  --  <0.1*  --   PROT 6.4* 5.2*  --  4.7*  --   ALBUMIN 3.4* 2.9* 2.4* 2.5* 2.5*   Recent Labs  Lab 10/07/21 1124  LIPASE 55*   Recent Labs  Lab 10/09/21 0322  AMMONIA 27   Coagulation Profile: No results for input(s): INR, PROTIME in the last 168 hours. Cardiac Enzymes: No results for input(s): CKTOTAL, CKMB, CKMBINDEX, TROPONINI in the last 168 hours. BNP (last 3 results) No results for input(s): PROBNP in the last 8760 hours. HbA1C: No results for input(s): HGBA1C in the last 72 hours. CBG: Recent Labs  Lab 10/08/21 0821 10/08/21 1801 10/08/21 2019 10/08/21 2126 10/08/21 2323  GLUCAP 206* 119* 159* 137* 142*   Lipid Profile: No results for input(s): CHOL, HDL, LDLCALC, TRIG, CHOLHDL, LDLDIRECT in the last 72 hours. Thyroid Function Tests: No results for input(s): TSH, T4TOTAL, FREET4, T3FREE, THYROIDAB in the last 72 hours. Anemia Panel: No results for input(s): VITAMINB12, FOLATE, FERRITIN, TIBC, IRON, RETICCTPCT in the last 72 hours. Sepsis Labs: No results for input(s): PROCALCITON, LATICACIDVEN in the last 168 hours.  Recent Results (from the past 240 hour(s))  Resp Panel by RT-PCR (Flu A&B, Covid) Nasopharyngeal Swab     Status: None   Collection Time: 10/07/21 11:25 AM   Specimen: Nasopharyngeal Swab; Nasopharyngeal(NP) swabs in vial transport medium  Result Value Ref Range Status   SARS Coronavirus 2 by RT PCR NEGATIVE NEGATIVE Final    Comment: (NOTE) SARS-CoV-2 target nucleic acids are NOT DETECTED.  The SARS-CoV-2 RNA is generally detectable in upper respiratory  specimens during the acute phase of infection. The lowest concentration of SARS-CoV-2 viral copies this assay can detect is 138  copies/mL. A negative result does not preclude SARS-Cov-2 infection and should not be used as the sole basis for treatment or other patient management decisions. A negative result may occur with  improper specimen collection/handling, submission of specimen other than nasopharyngeal swab, presence of viral mutation(s) within the areas targeted by this assay, and inadequate number of viral copies(<138 copies/mL). A negative result must be combined with clinical observations, patient history, and epidemiological information. The expected result is Negative.  Fact Sheet for Patients:  EntrepreneurPulse.com.au  Fact Sheet for Healthcare Providers:  IncredibleEmployment.be  This test is no t yet approved or cleared by the [place] FDA and  has been authorized for detection and/or diagnosis of SARS-CoV-2 by FDA under an Emergency Use Authorization (EUA). This EUA will remain  in effect (meaning this test can be used) for the duration of the COVID-19 declaration under Section 564(b)(1) of the Act, 21 U.S.C.section 360bbb-3(b)(1), unless the authorization is terminated  or revoked sooner.       Influenza A by PCR NEGATIVE NEGATIVE Final   Influenza B by PCR NEGATIVE NEGATIVE Final    Comment: (NOTE) The Xpert Xpress SARS-CoV-2/FLU/RSV plus assay is intended as an aid in the diagnosis of influenza from Nasopharyngeal swab specimens and should not be used as a sole basis for treatment. Nasal washings and aspirates are unacceptable for Xpert Xpress SARS-CoV-2/FLU/RSV testing.  Fact Sheet for Patients: EntrepreneurPulse.com.au  Fact Sheet for Healthcare Providers: IncredibleEmployment.be  This test is not yet approved or cleared by the [place] FDA and has been authorized for detection and/or diagnosis of SARS-CoV-2 by FDA under an Emergency Use Authorization (EUA). This EUA will remain in effect (meaning  this test can be used) for the duration of the COVID-19 declaration under Section 564(b)(1) of the Act, 21 U.S.C. section 360bbb-3(b)(1), unless the authorization is terminated or revoked.  Performed at [facility], [place] [address], [place], [place] [address]   Resp Panel by RT-PCR (Flu A&B, Covid) Nasopharyngeal Swab     Status: None   Collection Time: 10/08/21  4:41 PM   Specimen: Nasopharyngeal Swab; Nasopharyngeal(NP) swabs in vial transport medium  Result Value Ref Range Status   SARS Coronavirus 2 by RT PCR NEGATIVE NEGATIVE Final    Comment: (NOTE) SARS-CoV-2 target nucleic acids are NOT DETECTED.  The SARS-CoV-2 RNA is generally detectable in upper respiratory specimens during the acute phase of infection. The lowest concentration of SARS-CoV-2 viral copies this assay can detect is 138 copies/mL. A negative result does not preclude SARS-Cov-2 infection and should not be used as the sole basis for treatment or other patient management decisions. A negative result may occur with  improper specimen collection/handling, submission of specimen other than nasopharyngeal swab, presence of viral mutation(s) within the areas targeted by this assay, and inadequate number of viral copies(<138 copies/mL). A negative result must be combined with clinical observations, patient history, and epidemiological information. The expected result is Negative.  Fact Sheet for Patients:  EntrepreneurPulse.com.au  Fact Sheet for Healthcare Providers:  IncredibleEmployment.be  This test is no t yet approved or cleared by the [place] FDA and  has been authorized for detection and/or diagnosis of SARS-CoV-2 by FDA under an Emergency Use Authorization (EUA). This EUA will remain  in effect (meaning this test can be used) for the duration of the COVID-19 declaration under Section 564(b)(1) of the  Act, 21 U.S.C.section 360bbb-3(b)(1), unless  the authorization is terminated  or revoked sooner.       Influenza A by PCR NEGATIVE NEGATIVE Final   Influenza B by PCR NEGATIVE NEGATIVE Final    Comment: (NOTE) The Xpert Xpress SARS-CoV-2/FLU/RSV plus assay is intended as an aid in the diagnosis of influenza from Nasopharyngeal swab specimens and should not be used as a sole basis for treatment. Nasal washings and aspirates are unacceptable for Xpert Xpress SARS-CoV-2/FLU/RSV testing.  Fact Sheet for Patients: EntrepreneurPulse.com.au  Fact Sheet for Healthcare Providers: IncredibleEmployment.be  This test is not yet approved or cleared by the [place] FDA and has been authorized for detection and/or diagnosis of SARS-CoV-2 by FDA under an Emergency Use Authorization (EUA). This EUA will remain in effect (meaning this test can be used) for the duration of the COVID-19 declaration under Section 564(b)(1) of the Act, 21 U.S.C. section 360bbb-3(b)(1), unless the authorization is terminated or revoked.  Performed at [facility], [place] [address] [place], [place] [address]          Radiology Studies: DG Chest Port 1 View  Result Date: 10/08/2021 CLINICAL DATA:  Shortness of breath and hematemesis. EXAM: PORTABLE CHEST 1 VIEW COMPARISON:  October 07, 2021 FINDINGS: Cardiomediastinal silhouette is mildly enlarged. Cardiac pacemaker unchanged. Streaky perihilar opacities and possible more confluent airspace consolidation in the left lung base persist. Additional streaky airspace consolidation the right lung base noted. Osseous structures are without acute abnormality. Soft tissues are grossly normal. IMPRESSION: Persistent streaky perihilar opacities and possible more confluent airspace consolidation in the bilateral lung bases. Electronically Signed   By: Fidela Salisbury M.D.   On: 10/08/2021 13:05   DG Abd Portable 1V  Result Date: 10/09/2021 CLINICAL DATA:  Check gastric  catheter placement EXAM: PORTABLE ABDOMEN - 1 VIEW COMPARISON:  10/07/2021 FINDINGS: Gastric catheter is noted within the stomach. Pacing device is seen. Cardiac shadow is stable. No free air is noted. IMPRESSION: Gastric catheter within the stomach. Electronically Signed   By: Inez Catalina M.D.   On: 10/09/2021 03:42   EEG adult  Result Date: 10/08/2021 Lora Havens, MD     10/08/2021 12:06 PM Patient Name: CLEATIS FANDRICH MRN: [id number] Epilepsy Attending: Lora Havens Referring Physician/Provider: Ida Rogue, MD Duration: 10/08/2021 0749 to 1051 Patient history: 71yo M with ams and right foot twitching. Rapid eeg to evaluate for status epilepticus. Level of alertness: lethargic AEDs during EEG study: LEV Technical aspects: This EEG was obtained using a 10 lead EEG system positioned circumferentially without any parasagittal coverage (rapid EEG). Computer selected EEG is reviewed as  well as background features and all clinically significant events. Description: EEG showed continuous generalized polymorphic 3 to 6 Hz theta-delta slowing admixed with 14-16hz  frontocentral beta activity. Intermittent sharply contoured 3-5Hz  theta-delta slowing was also seen in left frontotemporal region. Sharp transient was seen in left temporal region. Patient was noted to have right leg twitching per RN. However, no eeg change was seen suggestive of seizure. Hyperventilation and photic stimulation were not performed.   ABNORMALITY - Intermittent slow, left fronto-temporal region - Continuous slow, generalized IMPRESSION: This limited ceribell EEG is suggestive of cortical dysfunction arising from left frontotemporal region, nonspecific etiology but could be secondary to underlying prior stroke, ictal/post-ictal state. . Additionally, this study is suggestive of moderate to severe diffuse encephalopathy, nonspecific etiology. RN reported right leg twitching during the study without concomitant eeg change. However,  focal motor seizures may not  be seen on scalp eeg. Therefore, consider video eeg and clinical correlation. Priyanka [name]   Overnight EEG with video  Result Date: 10/08/2021 Tsosie Billing, MD     10/08/2021  6:43 PM [facility] STAT LTM Read: 10/08/2021 15:37 - 10/08/2021 18:41 Patient Name:   [name] Date of Birth:   02-25-1951 Identification Number:   MRN - [id number] Indication: Encephalopathy, Technical Summary: A routine 20 channel electroencephalogram using the international 10-20 system of electrode placement was performed. Background: 5-6 Hz, Poorly formed States      Awake Abnormalities Generalized Slowing: Diffuse generalized slowing Background Slowing: The background consists of 20-50 uV, 5-6 Hz diffuse activity with superimposed diffuse polymorphic delta activity that is non reactive to external stimulation. Activation Procedures Hyperventilation: Not performed Photic Stimulation: Classification: Abnormal : Diagnosis: This is abnormal EEG, the Presence of Generalized slowing is consistent with Encephalopathy Dr Tsosie Billing [facility] [phone] Case [id number]       Scheduled Meds:  amLODipine  5 mg Per Tube Daily   atorvastatin  40 mg Per Tube Daily   brimonidine  1 drop Both Eyes Daily   Chlorhexidine Gluconate Cloth  6 each Topical Daily   dorzolamide-timolol  1 drop Both Eyes Daily   glycopyrrolate  1 mg Per Tube BID   hydrOXYzine  25 mg Per Tube QHS   latanoprost  1 drop Both Eyes QHS   mometasone-formoterol  2 puff Inhalation BID   ondansetron (ZOFRAN) IV  4 mg Intravenous Once   pantoprazole  40 mg Intravenous Q12H   umeclidinium bromide  1 puff Inhalation Daily   Continuous Infusions:  ampicillin-sulbactam (UNASYN) IV 3 g (10/09/21 2311)   levETIRAcetam 500 mg (10/09/21 2230)   sodium bicarbonate 150 mEq in D5W infusion 85 mL/hr at 10/10/21 0221     LOS: 0 days         Shelly Coss,  MD [facility] P1/16/2023, 8:04 AM

## 2021-10-10 NOTE — Progress Notes (Signed)
[facility] Progress Note  Subjective: pt eval bedside - daughter present, said he's continued to improve.  Ate some applesauce and drank some water and milk this AM.  No IVF running currently.  Pt not really participating in interview.   Vitals:   10/10/21 0455 10/10/21 0740 10/10/21 0912 10/10/21 0916  BP:  131/72    Pulse:  72    Resp:  15    Temp:  98.2 F (36.8 C)    TempSrc:  Oral    SpO2:  98% 98% 100%  Weight: 78.4 kg       Exam Lying flat in bed   Chronically ill appearing \ Chest rhonchi BL, occ rattling cough  Cor reg no RG  Abd soft ntnd no ascites  GU  foley draining amber urine   Ext 1+ dependent thigh edema, 2+ BL hand edema, 1+ forearm   Neuro as above      Home meds include - norvasc 5 qd, asa, lipitor, brestri aerosphere, keppra 500 bid, metoprolol 50 bid, flomax, eye gtts/ prns/ vits/ supps      Date                          Creat               eGFR    2018                         1.77- 2.66    2019                         1.78- 2.49    2020                         2.48- 4.32    2021                         2.09    July- sept 2022        1.73- 2.72        26- 42 ml/min, stage IIIb             Jan 13                      2.57    Jan 14                      2.94, 3.47      CXR 1/13 - IMPRESSION: Bibasilar atelectasis versus infiltrate.    CXR 1/14 - IMPRESSION: Persistent streaky perihilar opacities and possible more confluent airspace consolidation in the bilateral lung bases.     CT abd noncon 1/13 - Urinary Tract: enlarging now 2.6 cm indeterminate right renal anterior upper pole lesion, slowly increasing in size since 2017. Additional bilateral simple and complex renal cysts are not significantly changed. No renal calculi or hydronephrosis. Bladder is unremarkable.     UA pend      UNa 54, UCr 60 FeNa 1.8%          Assessment/ Plan: AKI on CKD 3b - b/l creatinine 1.73- 2.72, eGFR 26- 42 ml/min from fall 2022.  Creat 2.5 on admit  then up to 3.4. CT abd shows no renal obstruction. UA ordered, not sent yet, reordered and now pending/  FeNa 1.8% intrisic.  No nephrotoxins.  Suspected AKI due to urinary retention, vol depletion, underlying CKD.  Foley placed w/ good UOP at 2.4L yesterday. Started IVF 100 cc/hr but now with worsening edema - d/c IVF. Pt is a poor dialysis candidate due to severe comorbidities and debility, Dr. Jonnie Finner d/w pt's family. Creat improving.  Cont to follow daily labs.  Hyperkalemia - resolved now at 4.4 today. Metabolic acidosis - resolved, d/c bicarb gtt  HTN - BP's normal, dc'd norvasc and lowered metoprolol to 25 bid. Avoid over treatment, keep SBP > 120 for now w/ AKI.  PNA/ sepsis - no shock, on IV abx AMS - hx of seizure d/o, per neuro no evidence seizures  CIDP - outpt follows with Dr. Tillman Abide.  Anemia - Hb 7- 7.5, transfuse prn   Palliative following.   Jannifer Hick MD [facility] Pager [phone]    Recent Labs  Lab 10/09/21 [phone] 10/09/21 0957 10/09/21 1120 10/09/21 1525 10/10/21 0125 10/10/21 0548  K 6.0*   < >  --    < > 4.9 4.4  BUN 126*  --   --   --  97* 89*  CREATININE 3.50*  --   --   --  2.97* 2.90*  CALCIUM 9.1  --   --   --  9.4 9.4  PHOS 2.6  --   --   --  2.9  --   HGB 6.3*  --  6.2*  --   --  8.6*   < > = values in this interval not displayed.    Inpatient medications:  amLODipine  5 mg Per Tube Daily   atorvastatin  40 mg Per Tube Daily   brimonidine  1 drop Both Eyes Daily   Chlorhexidine Gluconate Cloth  6 each Topical Daily   dorzolamide-timolol  1 drop Both Eyes Daily   glycopyrrolate  1 mg Per Tube BID   hydrOXYzine  25 mg Per Tube QHS   latanoprost  1 drop Both Eyes QHS   mometasone-formoterol  2 puff Inhalation BID   ondansetron (ZOFRAN) IV  4 mg Intravenous Once   pantoprazole  40 mg Intravenous Q12H   umeclidinium bromide  1 puff Inhalation Daily    ampicillin-sulbactam (UNASYN) IV 3 g (10/09/21 2311)   levETIRAcetam 500 mg  (10/09/21 2230)   sodium bicarbonate 150 mEq in D5W infusion 85 mL/hr at 10/10/21 0221   acetaminophen **OR** acetaminophen, bisacodyl, food thickener, guaiFENesin-dextromethorphan, hydrALAZINE, ondansetron **OR** ondansetron (ZOFRAN) IV

## 2021-10-10 NOTE — Progress Notes (Signed)
Speech Language Pathology Treatment: Dysphagia  Patient Details Name: Darrell [name] MRN: [id number] DOB: [birth year] Today's Date: 10/10/2021 Time: 1000-1025 SLP Time Calculation (min) (ACUTE ONLY): 25 min  Assessment / Plan / Recommendation Clinical Impression  Pt seen at bedside for follow up after BSE completed 10/09/21. Pt's daughter was present. Pt kept eyes closed (he is blind per dtr). Vocal quality was at whisper level, but he was able to produce clear voicing on request. Pt was noted to exhibit strong congested cough prior to PO trials, and did so several other times during this session. No immediate cough response following PO trials, however, risk is increased. Pt appears quite weak, with limited reserve. Advanced textures were not given at this time for this reason, and due to increased aspiration risk.   Pt/daughter state plan for DC home tomorrow. Recommend completion of instrumental study tomorrow to more formally assess swallow function and  safety, and determine least restrictive diet. Follow up with HH/OP speech therapy may be recommended based on results.   HPI HPI: Darrell [name] is a 71 y.o. male with medical history significant of osteoarthritis, bradycardia, stage III CKD, dysphagia, GERD, glaucoma, hyperlipidemia history of other nonhemorrhagic CVA, hypertension, inguinal hernia, liver hemangioma muscular disorder, nuclear sclerosis of both eyes. Pt with coffee ground emesis x1 during admission, GI following.  Most recent MBS in 08/20 recommending dysphagia 1 solids and thin liquids with meds whole in puree.      SLP Plan  Continue with current plan of care      Recommendations for follow up therapy are one component of a multi-disciplinary discharge planning process, led by the attending physician.  Recommendations may be updated based on patient status, additional functional criteria and insurance authorization.    Recommendations  Diet recommendations: Dysphagia 1  (puree);Nectar-thick liquid Liquids provided via: Straw;Cup Medication Administration: Crushed with puree Supervision: Full supervision/cueing for compensatory strategies Compensations: Slow rate;Small sips/bites;Minimize environmental distractions                Oral Care Recommendations: Oral care BID;Staff/trained caregiver to provide oral care Follow Up Recommendations: Skilled nursing-short term rehab (<3 hours/day) Assistance recommended at discharge: Frequent or constant Supervision/Assistance SLP Visit Diagnosis: Dysphagia, oropharyngeal phase (R13.12) Plan: Continue with current plan of care          Darrell [name] B. Darrell [name], [facility], [place] Speech Language Pathologist Office: [phone]  Darrell [name], Darrell [name]  10/10/2021, 10:32 AM

## 2021-10-10 NOTE — Progress Notes (Addendum)
Progress Note   Subjective  Chief Complaint:CGE rectosigmoid impaction  Patient responsive to verbal stimuli, otherwise having eyes closed. No evidence of any GI bleeding at this time, daughter at bedside. Patient had some water, milk and applesauce this morning without nausea and vomiting.   Objective   Vital signs in last 24 hours: Temp:  [97.6 F (36.4 C)-98.8 F (37.1 C)] 98.2 F (36.8 C) (01/16 0740) Pulse Rate:  [54-81] 60 (01/16 1109) Resp:  [11-22] 14 (01/16 1109) BP: (127-166)/(60-93) 143/73 (01/16 1109) SpO2:  [93 %-100 %] 99 % (01/16 1109) Weight:  [78.4 kg] 78.4 kg (01/16 0455) Last BM Date:  (PTA; coffee ground emesis)  General:   Chronically ill-appearing African-American male. Heart:  regular rate and rhythm Pulm: Clear anteriorly; no wheezing Abdomen:  Soft, Obese AB, Sluggish bowel sounds.  no  tenderness  Extremities:  Without edema, LE atrophy Neurologic:  Alert and  oriented   Intake/Output from previous day: 01/15 0701 - 01/16 0700 In: 2516.5 [I.V.:1151.5; Blood:1165; IV Piggyback:200] Out: 2400 [Urine:2400] Intake/Output this shift: No intake/output data recorded.  Lab Results: Recent Labs    10/08/21 1449 10/08/21 2021 10/09/21 0322 10/09/21 1120 10/10/21 0548  WBC 10.9*  --   --   --  7.6  HGB 7.4*   < > 6.3* 6.2* 8.6*  HCT 23.6*   < > 20.4* 20.5* 26.5*  PLT 159  --   --   --  145*   < > = values in this interval not displayed.   BMET Recent Labs    10/09/21 0322 10/09/21 0957 10/09/21 2113 10/10/21 0125 10/10/21 0548  NA 141  --   --  146* 147*  K 6.0*   < > 5.3* 4.9 4.4  CL 116*  --   --  113* 112*  CO2 18*  --   --  23 26  GLUCOSE 267*  --   --  116* 125*  BUN 126*  --   --  97* 89*  CREATININE 3.50*  --   --  2.97* 2.90*  CALCIUM 9.1  --   --  9.4 9.4   < > = values in this interval not displayed.   LFT Recent Labs    10/09/21 0957 10/10/21 0125  PROT 4.7*  --   ALBUMIN 2.5* 2.5*  AST 9*  --   ALT 10  --    ALKPHOS 47  --   BILITOT <0.1*  --   BILIDIR <0.1  --   IBILI NOT CALCULATED  --    PT/INR No results for input(s): LABPROT, INR in the last 72 hours.  Studies/Results: DG Chest Port 1 View  Result Date: 10/08/2021 CLINICAL DATA:  Shortness of breath and hematemesis. EXAM: PORTABLE CHEST 1 VIEW COMPARISON:  October 07, 2021 FINDINGS: Cardiomediastinal silhouette is mildly enlarged. Cardiac pacemaker unchanged. Streaky perihilar opacities and possible more confluent airspace consolidation in the left lung base persist. Additional streaky airspace consolidation the right lung base noted. Osseous structures are without acute abnormality. Soft tissues are grossly normal. IMPRESSION: Persistent streaky perihilar opacities and possible more confluent airspace consolidation in the bilateral lung bases. Electronically Signed   By: Fidela Salisbury M.D.   On: 10/08/2021 13:05   DG Abd Portable 1V  Result Date: 10/09/2021 CLINICAL DATA:  Check gastric catheter placement EXAM: PORTABLE ABDOMEN - 1 VIEW COMPARISON:  10/07/2021 FINDINGS: Gastric catheter is noted within the stomach. Pacing device is seen. Cardiac shadow is stable. No free air  is noted. IMPRESSION: Gastric catheter within the stomach. Electronically Signed   By: Inez Catalina M.D.   On: 10/09/2021 03:42   Overnight EEG with video  Result Date: 10/08/2021 Tsosie Billing, MD     10/08/2021  6:43 PM [facility] [employer] STAT LTM Read: 10/08/2021 15:37 - 10/08/2021 18:41 Patient Name:   Darrell [name] Date of Birth:   02-23-1951 Identification Number:   MRN - [id number] Indication: Encephalopathy, Technical Summary: A routine 20 channel electroencephalogram using the international 10-20 system of electrode placement was performed. Background: 5-6 Hz, Poorly formed States      Awake Abnormalities Generalized Slowing: Diffuse generalized slowing Background Slowing: The background consists of 20-50 uV, 5-6  Hz diffuse activity with superimposed diffuse polymorphic delta activity that is non reactive to external stimulation. Activation Procedures Hyperventilation: Not performed Photic Stimulation: Classification: Abnormal : Diagnosis: This is abnormal EEG, the Presence of Generalized slowing is consistent with Encephalopathy Dr Tsosie Billing [employer] [phone] Case [id number]     Impression/Plan:   71 year old male with ALS with one episode of CGE no further signs of GI bleed, H/H trending well.  HGB 8.6 MCV 93.3 HGB stable, no signs of active bleeding, no plan for endoscopic evaluation  Continue on PPI outpatient We will plan to sign off, can reconsult [place] if any further signs of rebleeding.   Constipation w/ rectosigmoid "stool ball" on CT Will continue to be ongoing issue with ALS Continue miralax/fiber outpatient with as needed SMOG enema  Patient follows up with Dr. Loletha Carrow, can schedule for outpatient follow-up.     LOS: 0 days   Vladimir Crofts  10/10/2021, 12:12 PM   Attending physician's note   I have taken a history, reviewed the chart and examined the patient. I performed a substantive portion of this encounter, including complete performance of at least one of the key components, in conjunction with the APP. I agree with the APP's note, impression and recommendations.    71 year old male with CKD, ALS, history of CVA, seizure disorder transferred from [place] to [facility] for continuous EEG  He has generalized slowing consistent with encephalopathy  On exam he is alert, not oriented with slurred speech  He has bilateral upper extremity edema, no lower extremity edema  Hemoglobin has remained stable with no signs of active bleeding Will hold off endoscopic evaluation, is high risk for potential procedures and anesthesia Continue PPI  Daily bowel regimen with MiraLAX and Dulcolax suppository at bedtime as needed  GI will sign off, is available  if have any questions  The patient and his wife were provided an opportunity to ask questions and all were answered. His wife agreed with the plan and demonstrated an understanding of the instructions.   Damaris Hippo , MD [phone]

## 2021-10-10 NOTE — TOC Initial Note (Signed)
Transition of Care [facility]) - Initial/Assessment Note    Patient Details  Name: Darrell [name] MRN: [id number] Date of Birth: [birth year]  Transition of Care [facility]) CM/SW Contact:    Pollie Friar, RN Phone Number: 10/10/2021, 2:06 PM  Clinical Narrative:                 CM met with the patient and his spouse at the bedside. Pt didn't participate in the conversation.  Wife provided medications to the patient and she with her brother provide needed transportation. TOC following for d/c needs.   Expected Discharge Plan: Home/Self Care Barriers to Discharge: Continued Medical Work up   Patient Goals and CMS Choice        Expected Discharge Plan and Services Expected Discharge Plan: Home/Self Care   Discharge Planning Services: CM Consult   Living arrangements for the past 2 months: Single Family Home                                      Prior Living Arrangements/Services Living arrangements for the past 2 months: Single Family Home Lives with:: Spouse Patient language and need for interpreter reviewed:: Yes        Need for Family Participation in Patient Care: Yes (Comment) Care giver support system in place?: Yes (comment) Current home services: DME (hospital bed/ wheelchair/ 3 in 1/ lift) Criminal Activity/Legal Involvement Pertinent to Current Situation/Hospitalization: No - Comment as needed  Activities of Daily Living Home Assistive Devices/Equipment: Other (Comment) ADL Screening (condition at time of admission) Patient's cognitive ability adequate to safely complete daily activities?: Yes Is the patient deaf or have difficulty hearing?: No Does the patient have difficulty seeing, even when wearing glasses/contacts?: No Does the patient have difficulty concentrating, remembering, or making decisions?: Yes Patient able to express need for assistance with ADLs?: Yes Does the patient have difficulty dressing or bathing?: Yes Independently performs ADLs?:  No Communication: Independent Dressing (OT): Dependent Is this a change from baseline?: Pre-admission baseline Grooming: Dependent Is this a change from baseline?: Pre-admission baseline Feeding: Dependent Is this a change from baseline?: Pre-admission baseline Bathing: Dependent Is this a change from baseline?: Pre-admission baseline Toileting: Dependent Is this a change from baseline?: Pre-admission baseline In/Out Bed: Dependent Is this a change from baseline?: Pre-admission baseline Walks in Home: Dependent Is this a change from baseline?: Pre-admission baseline Does the patient have difficulty walking or climbing stairs?: Yes Weakness of Legs: Both Weakness of Arms/Hands: None  Permission Sought/Granted                  Emotional Assessment Appearance:: Appears stated age         Psych Involvement: No (comment)  Admission diagnosis:  Hematemesis [K92.0] Hyperkalemia [E87.5] Fecal impaction ([place]) [K56.41] Hypoxia [R09.02] Community acquired pneumonia, unspecified laterality [J18.9] Acute respiratory failure with hypoxia ([place]) [J96.01] Patient Active Problem List   Diagnosis Date Noted   Acute respiratory failure with hypoxia ([place]) 10/10/2021   Coffee ground emesis 10/07/2021   Hyperkalemia 10/07/2021   Normocytic anemia 10/07/2021   Bigeminal rhythm 10/07/2021   Fecal impaction ([place])    Acute metabolic encephalopathy 08/65/7846   COVID-19 virus infection 05/06/2021   Anemia, chronic disease 05/06/2021   Benign prostatic hyperplasia 05/06/2021   Ulcer of ankle ([place]) 03/29/2021   CKD (chronic kidney disease), stage IV ([place]) 03/09/2021   Cognitive communication deficit 01/10/2021   Chronic bronchitis ([place])  05/25/2020   Chronic cough 05/25/2020   Goals of care, counseling/discussion    Palliative care by specialist    ARF (acute renal failure) ([place]) 05/05/2019   Pain due to onychomycosis of toenails of both feet 03/25/2019   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[place]) 08/28/2018   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[place]) 07/24/2018   Dysarthria 05/21/2018   Dysphagia 05/21/2018   Gait abnormality 05/21/2018   Spasticity 05/21/2018   GERD (gastroesophageal reflux disease) 07/21/2017   Mixed hyperlipidemia 07/21/2017   Bradycardia    Seizure disorder ([place]) 07/08/2017   Status cardiac pacemaker 01/29/2017   Unintended weight loss 01/29/2017   Tinea pedis of right foot 01/29/2017   Hypertensive retinopathy of both eyes 01/16/2017   Hypercalcemia 01/15/2017   Heart block 01/15/2017   Nuclear sclerosis of both eyes 10/25/2016   Primary open angle glaucoma of both eyes, indeterminate stage 10/25/2016   Glaucoma 09/12/2016   Liver hemangioma 08/14/2016   Renal cyst 08/14/2016   Renal mass, right 08/14/2016   Elevated liver enzymes 08/10/2016   Chronic kidney disease, stage 3b ([place]) 08/16/2015   At high risk for falls 08/16/2015   Subcutaneous nodules 08/16/2015   Prediabetes 10/07/2013   Inguinal hernia 03/25/2013   History of CVA with residual deficit 03/25/2013   Essential hypertension 10/09/2012   Tobacco abuse 10/09/2012   PCP:  [name], MD Pharmacy:   [facility] ([place]), [place] - [address] [place]) [place] [address] Phone: [phone] Fax: [phone]     Social Determinants of Health (SDOH) Interventions    Readmission Risk Interventions No flowsheet data found.

## 2021-10-10 NOTE — Progress Notes (Signed)
Palliative:  HPI: 71 y.o. male  with past medical history of stroke, chronic inflammatory demyelinating polyneuropathy, seizures, dysphagia, bradycardia - second degree AV block s/p [employer] pacemaker placement, hyperlipidemia, CKD stage 3, renal mass, GERD, osteoarthritis admitted on 10/07/2021 with coffee ground emesis requiring transfusion with suspected rectosigmoid impaction. Hospitalization complicated by suspected aspiration pneumonia and concern for status epilepticus as well as acute on chronic kidney failure.    I met today at [facility] bedside along with his wife and daughter. Darrell [name] appears to be sleeping but he responds appropriately when addressed. Darrell [name] is anxious to return home and even asks if he can return home today. I explained that today is likely too soon but tomorrow will give [place] time to monitor and arrange care for in the home - he agrees that he can wait until tomorrow. He is feeling much better and feeling back to his baseline. He is eating and tolerating intake well according to family at bedside. They also report that he has been consuming water well (he typically drinks a lot of water). I emphasized the importance of maintaining hydration and that can be more difficult if he continues to require thickened liquids.   We talked through MOST form and updated to his current stated wishes. MOST form completed: DNR, limited interventions with return to the hospital, antibiotics as indicated, trial of IVF, and NO feeding tube. He initially reports that he would not desire return to the hospital but agrees that he would like to return if indicated but was very clear that he would not want his life prolonged artificially if he is not improving as he has this admission. Family at bedside support [place]'s decisions and wishes. I spent some time explaining loss of swallow ability and that patients do not suffer from hunger/thirst when this happens and explained comfort feeds. Upon completion of  MOST form another visitor came to bedside. I allowed them to have time for visit. I did mention potential of outpatient palliative to restart at home (previously were established with [facility]) but did not receive clear answer before I left. Will continue to offer additional support as desired.   All questions/concerns addressed. Emotional support provided.   Exam: Alert, appears understanding of conversations and decisions discussed. No distress. Able to open eyes and look around today. Tolerating pureed, nectar thick diet. No distress. Breathing regular, unlabored. Abd soft. Generalized weakness and fatigue.   Plan: - Hopeful to return home tomorrow. Consider outpatient palliative follow up.  - MOST form completed as described above.   [place], NP Palliative Medicine Team Pager [phone] (Please see [email or web address] for schedule) Team Phone [phone]    Greater than 50%  of this time was spent counseling and coordinating care related to the above assessment and plan

## 2021-10-11 ENCOUNTER — Inpatient Hospital Stay (HOSPITAL_COMMUNITY): Payer: Medicare HMO

## 2021-10-11 DIAGNOSIS — K920 Hematemesis: Secondary | ICD-10-CM | POA: Diagnosis not present

## 2021-10-11 DIAGNOSIS — R4182 Altered mental status, unspecified: Secondary | ICD-10-CM | POA: Diagnosis not present

## 2021-10-11 DIAGNOSIS — Z743 Need for continuous supervision: Secondary | ICD-10-CM | POA: Diagnosis not present

## 2021-10-11 DIAGNOSIS — Z7401 Bed confinement status: Secondary | ICD-10-CM | POA: Diagnosis not present

## 2021-10-11 LAB — CBC WITH DIFFERENTIAL/PLATELET
Abs Immature Granulocytes: 0.05 10*3/uL (ref 0.00–0.07)
Basophils Absolute: 0 10*3/uL (ref 0.0–0.1)
Basophils Relative: 0 %
Eosinophils Absolute: 0.5 10*3/uL (ref 0.0–0.5)
Eosinophils Relative: 6 %
HCT: 26.1 % — ABNORMAL LOW (ref 39.0–52.0)
Hemoglobin: 8.5 g/dL — ABNORMAL LOW (ref 13.0–17.0)
Immature Granulocytes: 1 %
Lymphocytes Relative: 20 %
Lymphs Abs: 1.4 10*3/uL (ref 0.7–4.0)
MCH: 30.9 pg (ref 26.0–34.0)
MCHC: 32.6 g/dL (ref 30.0–36.0)
MCV: 94.9 fL (ref 80.0–100.0)
Monocytes Absolute: 0.9 10*3/uL (ref 0.1–1.0)
Monocytes Relative: 12 %
Neutro Abs: 4.4 10*3/uL (ref 1.7–7.7)
Neutrophils Relative %: 61 %
Platelets: 149 10*3/uL — ABNORMAL LOW (ref 150–400)
RBC: 2.75 MIL/uL — ABNORMAL LOW (ref 4.22–5.81)
RDW: 13.8 % (ref 11.5–15.5)
WBC: 7.2 10*3/uL (ref 4.0–10.5)
nRBC: 0 % (ref 0.0–0.2)

## 2021-10-11 LAB — RENAL FUNCTION PANEL
Albumin: 2.5 g/dL — ABNORMAL LOW (ref 3.5–5.0)
Anion gap: 7 (ref 5–15)
BUN: 73 mg/dL — ABNORMAL HIGH (ref 8–23)
CO2: 26 mmol/L (ref 22–32)
Calcium: 8.8 mg/dL — ABNORMAL LOW (ref 8.9–10.3)
Chloride: 111 mmol/L (ref 98–111)
Creatinine, Ser: 2.53 mg/dL — ABNORMAL HIGH (ref 0.61–1.24)
GFR, Estimated: 27 mL/min — ABNORMAL LOW (ref >60)
Glucose, Bld: 112 mg/dL — ABNORMAL HIGH (ref 70–99)
Phosphorus: 3.2 mg/dL (ref 2.5–4.6)
Potassium: 3.9 mmol/L (ref 3.5–5.1)
Sodium: 144 mmol/L (ref 135–145)

## 2021-10-11 LAB — IRON AND TIBC
Iron: 36 ug/dL — ABNORMAL LOW (ref 45–182)
Saturation Ratios: 21 % (ref 17.9–39.5)
TIBC: 174 ug/dL — ABNORMAL LOW (ref 250–450)
UIBC: 138 ug/dL

## 2021-10-11 LAB — FERRITIN: Ferritin: 176 ng/mL (ref 24–336)

## 2021-10-11 LAB — PATHOLOGIST SMEAR REVIEW

## 2021-10-11 MED ORDER — LEVETIRACETAM 500 MG PO TABS
500.0000 mg | ORAL_TABLET | Freq: Two times a day (BID) | ORAL | Status: DC
  Filled 2021-10-11: qty 1

## 2021-10-11 MED ORDER — PANTOPRAZOLE SODIUM 40 MG PO TBEC: 40.0000 mg | DELAYED_RELEASE_TABLET | Freq: Two times a day (BID) | ORAL | 0 refills | Status: DC

## 2021-10-11 MED ORDER — POLYETHYLENE GLYCOL 3350 17 G PO PACK
17.0000 g | PACK | Freq: Every day | ORAL | 0 refills | Status: DC
Start: 2021-10-11 — End: 2022-07-06

## 2021-10-11 MED ORDER — SENNA 8.6 MG PO TABS: 1.0000 | ORAL_TABLET | Freq: Every day | ORAL | 0 refills | Status: DC

## 2021-10-11 MED ORDER — PANTOPRAZOLE SODIUM 40 MG PO TBEC
40.0000 mg | DELAYED_RELEASE_TABLET | Freq: Two times a day (BID) | ORAL | Status: DC
  Filled 2021-10-11: qty 1

## 2021-10-11 NOTE — Discharge Summary (Signed)
Physician Discharge Summary  Darrell [name] ZHG:[id number] DOB: 02-Jul-1951 DOA: 10/07/2021  PCP: [name], MD  Admit date: 10/07/2021 Discharge date: 10/11/2021  Admitted From: Home Disposition:  Home  Discharge Condition:Stable CODE STATUS: DNR Diet recommendation: Dysphagia 1  Brief/Interim Summary: Patient is a 71 year old male with history of osteoarthritis, CKD stage IIIb, dysphagia, GERD, hyperlipidemia, nonhemorrhagic CVA, hypertension, glaucoma, right renal mass, tobacco dependence, ALS mostly bedbound, seizure disorder who presents to the emergency department for the evaluation of coffee-ground emesis.  Lab work showed elevated leukocytes, acute kidney injury, hyperkalemia.  CT abdomen showed constipation/rectal impaction.  Chest x-ray was consistent with aspiration pneumonia.  On presentation, he was confused, there was concern for status epilepticus and he was transferred to [facility] from [facility].  He was also hypoxic needing nonrebreather.  PCCM, neurology, nephrology was consulted on admission.  Palliative care also consulted for goals of care, poor prognosis.  Overall status is stable, hemodynamically stable.  Patient and family wants to go home.  He will follow-up with palliative care as an outpatient  Following problems were addressed during his hospitalization: Acute hypoxic respiratory failure: Presented with tachypnea, leukocytosis, fever.  Aspiration pneumonia suspected.  Abx  changed to augmentin on dc.  On room air today   Acute blood loss anemia/coffee-ground emesis: On PPI twice daily.  GI not planning for endoscopic intervention.  FOBT negative.  Hemoglobin dropped to the range of 6.3 from 10.7 .  Status post 2 units  of blood transfusion.    No clear evidence of GI bleed.  Hemoglobin stable in the range of 8   Altered mental status: Multifactorial secondary to toxic metabolic encephalopathy from sepsis from pneumonia versus ongoing seizures versus uremic  encephalopathy.   CT head on presentation did not show any acute intracranial abnormalities but showed advanced chronic ischemic injury, brain atrophy.Marland Kitchen  MRI could not be done due to presence of pacemaker.  CT angiogram of the brain could not be done due to worsening renal function. Mental status has improved.  He has dementia at baseline and is not oriented to time.  He is following commands and communicates and says he wants to go home.   Seizure disorder/concern for status epilepticus:History of seizure disorder.  Suspicion for status epilepticus.   . EEG showed generalized slowing consistent with encephalopathy, cortical dysfunction arising from left frontotemporal region.  24-hour video EEG negative for any seizures. Takes Keppra at home.  Continue Keppra at current dose.  Neurology signed off   AKI on CKD stage IIIb/hyperkalemia/metabolic acidosis: Baseline creatinine ranges from 1.7-2.7.  Presented with AKI with creatinine in the range of 3.  Looked euvolemic on presentation.  CT abdomen did not show any renal obstruction.  Foley placed.  Started on bicarb drip.  Hyperkalemic on presentation, given IV insulin/calcium gluconate, albuterol, Lokelma.  Potassium level is now stable.  Patient has history of right renal mass, now measuring up to 2.6 cm with possibility of renal cell carcinoma.  Outpatient follow-up recommended with urology.   Hypertension: Currently normotensive.  Takes Norvasc and metoprolol at home.  Currently only on Norvasc   History of hyperlipidemia: Takes Lipitor at home.   History of glaucoma: On Alphagan and Cosopt drops   Constipation/fecal impaction: Continue bowel regimen.     Patient is status post pacemaker placement.   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  Basically bedbound.  Extremely poor prognosis.  Also has suspected renal mass, worsening kidney  function. CODE  STATUS is DNR. Palliative care consulted.  Wife is not ready to transition his care to comfort or hospice at home.  Her goal is to take him home      Discharge Diagnoses:  Principal Problem:   Coffee ground emesis Active Problems:   Essential hypertension   Prediabetes   Chronic kidney disease, stage 3b ([name])   Glaucoma   GERD (gastroesophageal reflux disease)   Mixed hyperlipidemia   Hyperkalemia   Normocytic anemia   Bigeminal rhythm   Acute respiratory failure with hypoxia [facility])    Discharge Instructions  Discharge Instructions     Diet general   Complete by: As directed    Dysphagia 1 diet   Discharge instructions   Complete by: As directed    1)Please take prescribed medications as instructed 2)Follow up with [facility] in a week for consideration of removal of Foley catheter and also follow-up on right renal mass.  Name and number of the provider group has been attached 3)Do a CBC, BMP test in a week.  Follow-up with your PCP in a week 4)Follow up with outpatient palliative care 5)Follow up with home health   Increase activity slowly   Complete by: As directed       Allergies as of 10/11/2021       Reactions   Ace Inhibitors Other (See Comments)   Hyperkalemia   Doxycycline Other (See Comments)   Hiccups, cough, nausea and emesis, elevated liver enzymes, elevated eosinophils, SOB concerning for early DRESS syndrome    Atacand Hct [candesartan Cilexetil-hctz] Hives   Shellfish Allergy Hives        Medication List     STOP taking these medications    aspirin EC 81 MG tablet   azithromycin 500 MG tablet Commonly known as: ZITHROMAX   meloxicam 15 MG tablet Commonly known as: MOBIC   metoprolol tartrate 50 MG tablet Commonly known as: LOPRESSOR       TAKE these medications    albuterol 108 (90 Base) MCG/ACT inhaler Commonly known as: VENTOLIN HFA Inhale 2 puffs into the lungs every 6 (six) hours as needed for up to 30 days for  Wheezing. What changed:  how much to take when to take this reasons to take this   amLODipine 5 MG tablet Commonly known as: NORVASC Take 1 tablet (5 mg total) by mouth daily.   atorvastatin 40 MG tablet Commonly known as: LIPITOR Take 1 tablet (40 mg total) by mouth daily.   Breztri Aerosphere 160-9-4.8 MCG/ACT Aero Generic drug: Budeson-Glycopyrrol-Formoterol Inhale 2 puffs into the lungs 2 (two) times daily. What changed:  when to take this reasons to take this   brimonidine 0.2 % ophthalmic solution Commonly known as: ALPHAGAN Place 1 drop into both eyes 3 times daily. What changed: Another medication with the same name was removed. Continue taking this medication, and follow the directions you see here.   cetirizine 10 MG tablet Commonly known as: ZYRTEC Take 1 tablet (10 mg total) by mouth daily.   dicyclomine 10 MG capsule Commonly known as: BENTYL TAKE 1 CAPSULE BY MOUTH IN THE MORNING AND AT BEDTIME What changed:  how much to take how to take this when to take this additional instructions   dorzolamide-timolol 22.3-6.8 MG/ML ophthalmic solution Commonly known as: COSOPT Place 1 drop into both eyes daily.   ferrous sulfate 325 (65 FE) MG tablet Commonly known as: FeroSul Take 1 tablet (325 mg total) by mouth 2 (two) times daily  with a meal.   fluticasone 50 MCG/ACT nasal spray Commonly known as: FLONASE Place 2 sprays into both nostrils daily.   glycopyrrolate 1 MG tablet Commonly known as: ROBINUL Take 1 tablet (1 mg total) by mouth 2 times daily.   guaiFENesin-dextromethorphan 100-10 MG/5ML syrup Commonly known as: ROBITUSSIN DM Take 5 mLs by mouth every 4 (four) hours as needed for cough.   hydrOXYzine 25 MG tablet Commonly known as: ATARAX Take 1 tablet (25 mg total) by mouth at bedtime.   latanoprost 0.005 % ophthalmic solution Commonly known as: XALATAN Place 1 drop into both eyes nightly.   levETIRAcetam 500 MG tablet Commonly known  as: KEPPRA Take 1 tablet (500 mg total) by mouth 2 (two) times daily.   lidocaine 5 % Commonly known as: LIDODERM Apply patch to painful area. Patch may remain in place for up to 12 hours in a 24 hour period.   Misc. Devices Misc Yankauer suction. Dx: ALS   Misc. Devices Misc Coughalator   Misc. Devices Misc Nebulizer machine.  Diagnosis- Motor neuron disease   Misc. [facility] Bed DXg12.20   [place]. [facility] bed extension. Diagnosis: ALS. Height 6'4"   Misc. Devices Misc Manual wheelchair.  Diagnosis-ALS   montelukast 10 MG tablet Commonly known as: SINGULAIR Take 10 mg by mouth at bedtime.   Multi-Vitamin tablet Take by mouth.   pantoprazole 40 MG tablet Commonly known as: PROTONIX Take 1 tablet (40 mg total) by mouth 2 (two) times daily.   polyethylene glycol 17 g packet Commonly known as: MiraLax Take 17 g by mouth daily.   senna 8.6 MG Tabs tablet Commonly known as: SENOKOT Take 1 tablet (8.6 mg total) by mouth daily.   tamsulosin 0.4 MG Caps capsule Commonly known as: FLOMAX Take 1 capsule (0.4 mg total) by mouth daily.   torsemide 20 MG tablet Commonly known as: DEMADEX Take 20 mg by mouth as needed (swelling).        Follow-up Information     [facility] Follow up.   Specialty: Home Health Services Why: The home health service will contact you for the first home visit. Contact information: [address] [place] [address] [phone]         [facility] Follow up.   Why: The palliative care will contact you for the first home visit. Contact information: [place] [address]        [name], MD. Schedule an appointment as soon as possible for a visit in 1 week(s).   Specialty: Family Medicine Contact information: [place] [address] [place]. Schedule an  appointment as soon as possible for a visit in 1 week(s).   Contact information: [place] [address] [phone]               Allergies  Allergen Reactions   Ace Inhibitors Other (See Comments)    Hyperkalemia   Doxycycline Other (See Comments)    Hiccups, cough, nausea and emesis, elevated liver enzymes, elevated eosinophils, SOB concerning for early DRESS syndrome    Atacand Hct [Candesartan Cilexetil-Hctz] Hives   Shellfish Allergy Hives    Consultations: Neurology, palliative care, GI, PCCM   Procedures/Studies: CT ABDOMEN PELVIS WO CONTRAST  Result Date: 10/07/2021 CLINICAL DATA:  Hematemesis.  Denies abdominal pain. EXAM: CT ABDOMEN AND PELVIS WITHOUT CONTRAST  TECHNIQUE: Multidetector CT imaging of the abdomen and pelvis was performed following the standard protocol without IV contrast. COMPARISON:  CT abdomen and pelvis dated May 05, 2019. FINDINGS: Lower chest: No acute abnormality. Scarring and atelectasis in the lingula and left-greater-than-right lower lobes. Hepatobiliary: Unchanged 2.5 cm low-density lesion in the anterior left hepatic lobe, previously characterized as a hemangioma. No new focal liver abnormality. The gallbladder is unremarkable. No biliary dilatation. Pancreas: Unremarkable. No pancreatic ductal dilatation or surrounding inflammatory changes. Spleen: Normal in size without focal abnormality. Adrenals/Urinary Tract: Adrenal glands are unremarkable. Enlarging now 2.6 cm indeterminate right renal anterior upper pole lesion, slowly increasing in size since 2017. Additional bilateral simple and complex renal cysts are not significantly changed. No renal calculi or hydronephrosis. Bladder is unremarkable. Stomach/Bowel: Distended distal esophagus and stomach. No bowel wall thickening, distention, or surrounding inflammatory changes. Large rectosigmoid stool ball. Normal appendix. Vascular/Lymphatic: Aortic atherosclerosis.  No enlarged abdominal or pelvic lymph nodes. Reproductive: Prostate is unremarkable. Other: No free fluid or pneumoperitoneum. Musculoskeletal: No acute or significant osseous findings. IMPRESSION: 1. Distended distal esophagus and stomach without obstruction. 2. Large rectosigmoid stool ball. Correlate for fecal impaction. 3. Enlarging now 2.6 cm indeterminate right renal anterior upper pole lesion, slowly increasing in size since 2017. Renal cell carcinoma is not excluded. Recommend non-emergent outpatient renal protocol MRI with and without contrast for further evaluation. 4. Aortic Atherosclerosis (ICD10-I70.0). Electronically Signed   By: Titus Dubin M.D.   On: 10/07/2021 13:24   DG Abdomen 1 View  Result Date: 10/07/2021 CLINICAL DATA:  Coffee-ground emesis EXAM: ABDOMEN - 1 VIEW COMPARISON:  Portable exam 1205 hours compared to CT abdomen and pelvis 08/11/2016 FINDINGS: Increased stool in rectum. Gas and normal stool throughout remainder of colon. Gaseous distention of stomach. Small bowel gas pattern normal. No bowel dilatation or wall thickening otherwise identified. Bones demineralized. IMPRESSION: Increased stool in rectum Electronically Signed   By: Lavonia Dana M.D.   On: 10/07/2021 12:24   DG Chest Port 1 View  Result Date: 10/08/2021 CLINICAL DATA:  Shortness of breath and hematemesis. EXAM: PORTABLE CHEST 1 VIEW COMPARISON:  October 07, 2021 FINDINGS: Cardiomediastinal silhouette is mildly enlarged. Cardiac pacemaker unchanged. Streaky perihilar opacities and possible more confluent airspace consolidation in the left lung base persist. Additional streaky airspace consolidation the right lung base noted. Osseous structures are without acute abnormality. Soft tissues are grossly normal. IMPRESSION: Persistent streaky perihilar opacities and possible more confluent airspace consolidation in the bilateral lung bases. Electronically Signed   By: Fidela Salisbury M.D.   On: 10/08/2021 13:05    DG Chest Portable 1 View  Result Date: 10/07/2021 CLINICAL DATA:  Vomiting, coffee-ground emesis, increased weakness EXAM: PORTABLE CHEST 1 VIEW COMPARISON:  Portable exam 1203 hours compared to 05/23/2021 FINDINGS: LEFT subclavian sequential transvenous pacemaker leads project at RIGHT atrium and RIGHT ventricle. Normal heart size, mediastinal contours, and pulmonary vascularity. Atherosclerotic calcification aorta. Rotated to the LEFT. Mild bibasilar atelectasis versus infiltrate. Remaining lungs clear. No pleural effusion or pneumothorax. Bones demineralized. IMPRESSION: Bibasilar atelectasis versus infiltrate. Electronically Signed   By: Lavonia Dana M.D.   On: 10/07/2021 12:22   DG Abd Portable 1V  Result Date: 10/09/2021 CLINICAL DATA:  Check gastric catheter placement EXAM: PORTABLE ABDOMEN - 1 VIEW COMPARISON:  10/07/2021 FINDINGS: Gastric catheter is noted within the stomach. Pacing device is seen. Cardiac shadow is stable. No free air is noted. IMPRESSION: Gastric catheter within the stomach. Electronically Signed   By: Linus Mako.D.  On: 10/09/2021 03:42   DG Swallowing Func-Speech Pathology  Result Date: 10/11/2021 Table formatting from the original result was not included. Objective Swallowing Evaluation: Type of Study: MBS-Modified Barium Swallow Study  Patient Details Name: AZION CENTRELLA MRN: [id number] Date of Birth: 06-14-1951 Today's Date: 10/11/2021 Time: SLP Start Time (ACUTE ONLY): 0945 -SLP Stop Time (ACUTE ONLY): 0737 SLP Time Calculation (min) (ACUTE ONLY): 30 min Past Medical History: Past Medical History: Diagnosis Date  Arthritis   At high risk for falls 08/16/2015  Bradycardia   Chronic kidney disease   stage 3 GFR 30-59 ml/min   CIDP (chronic inflammatory demyelinating polyneuropathy) ([name])   CKD (chronic kidney disease) stage 3, GFR 30-59 ml/min ([place]) 08/16/2015  Dysphagia as late effect of cerebrovascular disease   pts wife states pt has to eat soft foods   Elevated  liver enzymes 08/10/2016  GERD (gastroesophageal reflux disease)   Glaucoma   High cholesterol   History of CVA with residual deficit 03/25/2013  Hypertension   Hypertensive retinopathy of both eyes 01/16/2017  Inguinal hernia 03/25/2013  Liver hemangioma 08/14/2016  Neuromuscular disorder ([place])   chronic inflammatory demyelinating polyneuropathy   New onset seizure ([place]) 07/08/2017  seizure 07/14/18  Nuclear sclerosis of both eyes 10/25/2016  Presence of permanent cardiac pacemaker   placed in april 2018  Primary open angle glaucoma of both eyes, indeterminate stage 10/25/2016  Renal mass, right 08/14/2016  Status cardiac pacemaker 01/29/2017  Placed for second degree heart block on 01/16/17 Medtronic Azure XT DR MRI SureScan dual-chamber pacemaker  Stroke [facility])   2011 with residual deficit left sided weakness  Tobacco dependence  Past Surgical History: Past Surgical History: Procedure Laterality Date  EYE SURGERY    HERNIA REPAIR    INGUINAL HERNIA REPAIR Right 11/23/2014  Procedure: right inguinal hernia repair with mesh;  Surgeon: Armandina Gemma, MD;  Location: [facility];  Service: General;  Laterality: Right;  INSERTION OF MESH N/A 11/23/2014  Procedure: INSERTION OF MESH;  Surgeon: Armandina Gemma, MD;  Location: [facility];  Service: General;  Laterality: N/A;  MASS EXCISION Left 08/29/2017  Procedure: EXCISION OF LEFT NECK MASS;  Surgeon: Coralie Keens, MD;  Location: [place];  Service: General;  Laterality: Left;  PACEMAKER IMPLANT N/A 01/16/2017  Procedure: Pacemaker Implant;  Surgeon: Will Meredith Leeds, MD;  Location: [facility] [facility];  Service: Cardiovascular;  Laterality: N/A;  SHOULDER SURGERY Bilateral 1988, 1998 HPI: LAVONNE CASS is a 71 y.o. male with medical history significant of osteoarthritis, bradycardia, stage III CKD, dysphagia, GERD, glaucoma, hyperlipidemia history of other nonhemorrhagic CVA, hypertension, inguinal hernia, liver hemangioma muscular disorder, nuclear sclerosis of both eyes. Pt with coffee  ground emesis x1 during admission, GI following.  Most recent MBS in 08/20 recommending dysphagia 1 solids and thin liquids with meds whole in puree.  Subjective: Pt seen in radiology for MBS to objectively assess swallow function and safety.  Recommendations for follow up therapy are one component of a multi-disciplinary discharge planning process, led by the attending physician.  Recommendations may be updated based on patient status, additional functional criteria and insurance authorization. Assessment / Plan / Recommendation Clinical Impressions 10/11/2021 Clinical Impression Pt seen for MBS to objectively assess swallow function and safety, and to identify least restrictive diet. Pt presents with mild oral dysphagia, characterized by left anterior leakage, reduced mastication of solid textures, and premature spillage over tongue base. Generalized residue throughout the oral cavity was noted with solid texture, which cleared with additional dry swallows. Pt  presents with moderate pharyngeal dysphagia, characterized by trigger of the swallow reflex at the level of the vallecular sinus. Frank aspiration of nectar thick liquids was seen prior to initiation of the swallow reflex, with NO cough response elicited. Cued cough was significantly weak and ineffective. Mild diffuse residue was noted in the pharynx, which pt typically sensed and swallowed again to clear.  No penetration or aspiration observed on honey thick liquid, puree, or solid texture. There was intermittent backflow from the esophagus to the pharynx on several consistencies, however, esophageal sweep revealed it to be relatively clear without obvious stasis. Recommend puree diet with honey thick liquids, meds crushed in puree. Pt will benefit from assistance with PO intake due to visual limitations and to conserve energy. Safe swallow precautions were sent with transport to post [facility]. SLP will follow up with family to provide education. Recommend  continued ST intervention at next venue of care for dysphagia treatment and continued education.  SLP Visit Diagnosis Dysphagia, oropharyngeal phase (R13.12)     Impact on safety and function Moderate aspiration risk;Risk for inadequate nutrition/hydration   Treatment Recommendations 10/11/2021 Treatment Recommendations Therapy as outlined in treatment plan below   Prognosis 10/11/2021 Prognosis for Safe Diet Advancement Fair     Diet Recommendations 10/11/2021 SLP Diet Recommendations Dysphagia 1 (Puree) solids Honey thick liquids  Liquid Administration via Cup Spoon Straw  Medication Administration Crushed with puree  Compensations Minimize environmental distractions Slow rate Small sips/bites Lingual sweep for clearance of pocketing Monitor for anterior loss  Postural Changes Seated upright at 90 degrees   Other Recommendations 10/11/2021 Recommended Consults Home health ST  Oral Care Recommendations Oral care BID  Other Recommendations Order thickener from pharmacy  Follow Up Recommendations HHST  Assistance recommended at discharge Frequent or constant Supervision/Assistance  Functional Status Assessment Patient has had a recent decline in their functional status and demonstrates the ability to make significant improvements in function in a reasonable and predictable amount of time. Frequency and Duration  10/11/2021 Speech Therapy Frequency (ACUTE ONLY) min 2x/week Treatment Duration 2 weeks   Celia B. [name] [facility], [place] Speech Language Pathologist Office: [phone] [place], [place] 10/11/2021, 10:26 AM                     EEG adult  Result Date: 10/08/2021 Lora Havens, MD     10/08/2021 12:06 PM Patient Name: CISCO KINDT MRN: [id number] Epilepsy Attending: Lora Havens Referring Physician/Provider: Ida Rogue, MD Duration: 10/08/2021 0749 to 1051 Patient history: 71yo M with ams and right foot twitching. Rapid eeg to evaluate for status epilepticus. Level of alertness: lethargic AEDs  during EEG study: LEV Technical aspects: This EEG was obtained using a 10 lead EEG system positioned circumferentially without any parasagittal coverage (rapid EEG). Computer selected EEG is reviewed as  well as background features and all clinically significant events. Description: EEG showed continuous generalized polymorphic 3 to 6 Hz theta-delta slowing admixed with 14-16hz  frontocentral beta activity. Intermittent sharply contoured 3-5Hz  theta-delta slowing was also seen in left frontotemporal region. Sharp transient was seen in left temporal region. Patient was noted to have right leg twitching per RN. However, no eeg change was seen suggestive of seizure. Hyperventilation and photic stimulation were not performed.   ABNORMALITY - Intermittent slow, left fronto-temporal region - Continuous slow, generalized IMPRESSION: This limited ceribell EEG is suggestive of cortical dysfunction arising from left frontotemporal region, nonspecific etiology but could be secondary to underlying prior stroke, ictal/post-ictal  state. . Additionally, this study is suggestive of moderate to severe diffuse encephalopathy, nonspecific etiology. RN reported right leg twitching during the study without concomitant eeg change. However, focal motor seizures may not be seen on scalp eeg. Therefore, consider video eeg and clinical correlation. Priyanka [name]   Overnight EEG with video  Result Date: 10/08/2021 Tsosie Billing, MD     10/08/2021  6:43 PM [facility] [employer] Tele[facility] STAT LTM Read: 10/08/2021 15:37 - 10/08/2021 18:41 Patient Name:   [name] Date of Birth:   1951-05-12 Identification Number:   MRN - [id number] Indication: Encephalopathy, Technical Summary: A routine 20 channel electroencephalogram using the international 10-20 system of electrode placement was performed. Background: 5-6 Hz, Poorly formed States      Awake Abnormalities Generalized Slowing: Diffuse generalized  slowing Background Slowing: The background consists of 20-50 uV, 5-6 Hz diffuse activity with superimposed diffuse polymorphic delta activity that is non reactive to external stimulation. Activation Procedures Hyperventilation: Not performed Photic Stimulation: Classification: Abnormal : Diagnosis: This is abnormal EEG, the Presence of Generalized slowing is consistent with Encephalopathy Dr Tsosie Billing [employer] [phone] Case [id number]  CUP PACEART REMOTE DEVICE CHECK  Result Date: 09/22/2021 Scheduled remote reviewed. Normal device function.  Next remote 91 days. Kathy Breach, RN, CCDS, [employer]  CT HEAD CODE STROKE WO CONTRAST`  Result Date: 10/08/2021 CLINICAL DATA:  Code stroke. EXAM: CT HEAD WITHOUT CONTRAST TECHNIQUE: Contiguous axial images were obtained from the base of the skull through the vertex without intravenous contrast. RADIATION DOSE REDUCTION: This exam was performed according to the departmental dose-optimization program which includes automated exposure control, adjustment of the mA and/or kV according to patient size and/or use of iterative reconstruction technique. COMPARISON:  05/06/2021 FINDINGS: Brain: No evidence of acute infarction, hemorrhage, hydrocephalus, extra-axial collection or mass lesion/mass effect. Advanced chronic small vessel ischemia with confluent gliosis and multiple small vessel infarcts including in the right thalamus and right basal ganglia. 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 Small chronic right cerebellar infarct. Premature brain atrophy which is generalized Vascular: No hyperdense vessel or unexpected calcification. Skull: Normal. Negative for fracture or focal lesion. Sinuses/Orbits: No acute finding. Other: Attempted text communication to [employer] line in amion at 7:04 am on 10/08/2021. ASPECTS [facility] Stroke Program Early CT Score) Not scored without localizing symptom. IMPRESSION: 1.  No acute finding 2. Advanced chronic ischemic injury and brain atrophy. Electronically Signed   By: Jorje Guild M.D.   On: 10/08/2021 07:04      Subjective: Patient seen and examined the bedside this morning.  Hemodynamically stable for discharge today.  He was found to have urinary retention so Foley placed before discharge  Discharge Exam: Vitals:   10/11/21 1051 10/11/21 1112  BP: 129/76 132/83  Pulse:  75  Resp:  18  Temp:  98 F (36.7 C)  SpO2:  95%   Vitals:   10/11/21 0300 10/11/21 0757 10/11/21 1051 10/11/21 1112  BP: 116/60 116/66 129/76 132/83  Pulse: 66 73  75  Resp:  12  18  Temp: 98.9 F (37.2 C) 99.4 F (37.4 C)  98 F (36.7 C)  TempSrc: Axillary Oral  Oral  SpO2:  95%  95%  Weight: 81.3 kg       General: Pt is alert, awake, not in acute distress, very deconditioned Cardiovascular: RRR, S1/S2 +, no rubs, no gallops Respiratory: CTA bilaterally, no wheezing, no rhonchi Abdominal: Soft, NT, ND, bowel sounds + Extremities: no edema,  no cyanosis    The results of significant diagnostics from this hospitalization (including imaging, microbiology, ancillary and laboratory) are listed below for reference.     Microbiology: Recent Results (from the past 240 hour(s))  Resp Panel by RT-PCR (Flu A&B, Covid) Nasopharyngeal Swab     Status: None   Collection Time: 10/07/21 11:25 AM   Specimen: Nasopharyngeal Swab; Nasopharyngeal(NP) swabs in vial transport medium  Result Value Ref Range Status   SARS Coronavirus 2 by RT PCR NEGATIVE NEGATIVE Final    Comment: (NOTE) SARS-CoV-2 target nucleic acids are NOT DETECTED.  The SARS-CoV-2 RNA is generally detectable in upper respiratory specimens during the acute phase of infection. The lowest concentration of SARS-CoV-2 viral copies this assay can detect is 138 copies/mL. A negative result does not preclude SARS-Cov-2 infection and should not be used as the sole basis for treatment or other patient management  decisions. A negative result may occur with  improper specimen collection/handling, submission of specimen other than nasopharyngeal swab, presence of viral mutation(s) within the areas targeted by this assay, and inadequate number of viral copies(<138 copies/mL). A negative result must be combined with clinical observations, patient history, and epidemiological information. The expected result is Negative.  Fact Sheet for Patients:  EntrepreneurPulse.com.au  Fact Sheet for Healthcare Providers:  IncredibleEmployment.be  This test is no t yet approved or cleared by the [place] FDA and  has been authorized for detection and/or diagnosis of SARS-CoV-2 by FDA under an Emergency Use Authorization (EUA). This EUA will remain  in effect (meaning this test can be used) for the duration of the COVID-19 declaration under Section 564(b)(1) of the Act, 21 U.S.C.section 360bbb-3(b)(1), unless the authorization is terminated  or revoked sooner.       Influenza A by PCR NEGATIVE NEGATIVE Final   Influenza B by PCR NEGATIVE NEGATIVE Final    Comment: (NOTE) The Xpert Xpress SARS-CoV-2/FLU/RSV plus assay is intended as an aid in the diagnosis of influenza from Nasopharyngeal swab specimens and should not be used as a sole basis for treatment. Nasal washings and aspirates are unacceptable for Xpert Xpress SARS-CoV-2/FLU/RSV testing.  Fact Sheet for Patients: EntrepreneurPulse.com.au  Fact Sheet for Healthcare Providers: IncredibleEmployment.be  This test is not yet approved or cleared by the [place] FDA and has been authorized for detection and/or diagnosis of SARS-CoV-2 by FDA under an Emergency Use Authorization (EUA). This EUA will remain in effect (meaning this test can be used) for the duration of the COVID-19 declaration under Section 564(b)(1) of the Act, 21 U.S.C. section 360bbb-3(b)(1), unless the  authorization is terminated or revoked.  Performed at [facility], [place] [address] [place], [place] [address]   Resp Panel by RT-PCR (Flu A&B, Covid) Nasopharyngeal Swab     Status: None   Collection Time: 10/08/21  4:41 PM   Specimen: Nasopharyngeal Swab; Nasopharyngeal(NP) swabs in vial transport medium  Result Value Ref Range Status   SARS Coronavirus 2 by RT PCR NEGATIVE NEGATIVE Final    Comment: (NOTE) SARS-CoV-2 target nucleic acids are NOT DETECTED.  The SARS-CoV-2 RNA is generally detectable in upper respiratory specimens during the acute phase of infection. The lowest concentration of SARS-CoV-2 viral copies this assay can detect is 138 copies/mL. A negative result does not preclude SARS-Cov-2 infection and should not be used as the sole basis for treatment or other patient management decisions. A negative result may occur with  improper specimen collection/handling, submission of specimen other than nasopharyngeal swab, presence of  viral mutation(s) within the areas targeted by this assay, and inadequate number of viral copies(<138 copies/mL). A negative result must be combined with clinical observations, patient history, and epidemiological information. The expected result is Negative.  Fact Sheet for Patients:  EntrepreneurPulse.com.au  Fact Sheet for Healthcare Providers:  IncredibleEmployment.be  This test is no t yet approved or cleared by the [place] FDA and  has been authorized for detection and/or diagnosis of SARS-CoV-2 by FDA under an Emergency Use Authorization (EUA). This EUA will remain  in effect (meaning this test can be used) for the duration of the COVID-19 declaration under Section 564(b)(1) of the Act, 21 U.S.C.section 360bbb-3(b)(1), unless the authorization is terminated  or revoked sooner.       Influenza A by PCR NEGATIVE NEGATIVE Final   Influenza B by PCR NEGATIVE NEGATIVE  Final    Comment: (NOTE) The Xpert Xpress SARS-CoV-2/FLU/RSV plus assay is intended as an aid in the diagnosis of influenza from Nasopharyngeal swab specimens and should not be used as a sole basis for treatment. Nasal washings and aspirates are unacceptable for Xpert Xpress SARS-CoV-2/FLU/RSV testing.  Fact Sheet for Patients: EntrepreneurPulse.com.au  Fact Sheet for Healthcare Providers: IncredibleEmployment.be  This test is not yet approved or cleared by the [place] FDA and has been authorized for detection and/or diagnosis of SARS-CoV-2 by FDA under an Emergency Use Authorization (EUA). This EUA will remain in effect (meaning this test can be used) for the duration of the COVID-19 declaration under Section 564(b)(1) of the Act, 21 U.S.C. section 360bbb-3(b)(1), unless the authorization is terminated or revoked.  Performed at [facility], [place] [address], [place], [place] [address]      Labs: BNP (last 3 results) Recent Labs    04/13/21 2325  BNP 174.0*   Basic Metabolic Panel: Recent Labs  Lab 10/08/21 2021 10/08/21 2315 10/09/21 0322 10/09/21 0957 10/09/21 1525 10/09/21 2113 10/10/21 0125 10/10/21 0548 10/11/21 0247  NA 141  --  141  --   --   --  146* 147* 144  K 6.2*   < > 6.0*   < > 5.6* 5.3* 4.9 4.4 3.9  CL 115*  --  116*  --   --   --  113* 112* 111  CO2 17*  --  18*  --   --   --  23 26 26   GLUCOSE 172*  --  267*  --   --   --  116* 125* 112*  BUN 129*  --  126*  --   --   --  97* 89* 73*  CREATININE 3.43*  --  3.50*  --   --   --  2.97* 2.90* 2.53*  CALCIUM 9.3  --  9.1  --   --   --  9.4 9.4 8.8*  PHOS  --   --  2.6  --   --   --  2.9  --  3.2   < > = values in this interval not displayed.   Liver Function Tests: Recent Labs  Lab 10/07/21 1124 10/08/21 0515 10/09/21 0322 10/09/21 0957 10/10/21 0125 10/11/21 0247  AST 14* 10*  --  9*  --   --   ALT 14 11  --  10  --   --   ALKPHOS 94 58  --   47  --   --   BILITOT 0.6 0.5  --  <0.1*  --   --   PROT 6.4* 5.2*  --  4.7*  --   --   ALBUMIN 3.4* 2.9* 2.4* 2.5* 2.5* 2.5*   Recent Labs  Lab 10/07/21 1124  LIPASE 55*   Recent Labs  Lab 10/09/21 0322  AMMONIA 27   CBC: Recent Labs  Lab 10/07/21 1124 10/08/21 1449 10/08/21 2021 10/09/21 0322 10/09/21 1120 10/10/21 0548 10/11/21 0247  WBC 13.4* 10.9*  --   --   --  7.6 7.2  NEUTROABS 11.1*  --   --   --   --  4.7 4.4  HGB 10.7* 7.4* 7.0* 6.3* 6.2* 8.6* 8.5*  HCT 34.4* 23.6* 22.2* 20.4* 20.5* 26.5* 26.1*  MCV 95.3 96.3  --   --   --  93.3 94.9  PLT 217 159  --   --   --  145* 149*   Cardiac Enzymes: No results for input(s): CKTOTAL, CKMB, CKMBINDEX, TROPONINI in the last 168 hours. BNP: Invalid input(s): POCBNP CBG: Recent Labs  Lab 10/08/21 0821 10/08/21 1801 10/08/21 2019 10/08/21 2126 10/08/21 2323  GLUCAP 206* 119* 159* 137* 142*   D-Dimer No results for input(s): DDIMER in the last 72 hours. Hgb A1c No results for input(s): HGBA1C in the last 72 hours. Lipid Profile No results for input(s): CHOL, HDL, LDLCALC, TRIG, CHOLHDL, LDLDIRECT in the last 72 hours. Thyroid function studies No results for input(s): TSH, T4TOTAL, T3FREE, THYROIDAB in the last 72 hours.  Invalid input(s): FREET3 Anemia work up Recent Labs    10/10/21 0500  FERRITIN 176  TIBC 174*  IRON 36*   Urinalysis    Component Value Date/Time   COLORURINE YELLOW 10/10/2021 1125   APPEARANCEUR CLEAR 10/10/2021 1125   LABSPEC 1.010 10/10/2021 1125   PHURINE 6.0 10/10/2021 1125   GLUCOSEU NEGATIVE 10/10/2021 1125   HGBUR LARGE (A) 10/10/2021 1125   BILIRUBINUR NEGATIVE 10/10/2021 1125   BILIRUBINUR negative 09/07/2017 [place] 10/10/2021 1125   PROTEINUR NEGATIVE 10/10/2021 1125   UROBILINOGEN 0.2 09/07/2017 1645   UROBILINOGEN 4.0 (H) 08/04/2016 1517   NITRITE NEGATIVE 10/10/2021 1125   LEUKOCYTESUR SMALL (A) 10/10/2021 1125   Sepsis Labs Invalid  input(s): PROCALCITONIN,  WBC,  LACTICIDVEN Microbiology Recent Results (from the past 240 hour(s))  Resp Panel by RT-PCR (Flu A&B, Covid) Nasopharyngeal Swab     Status: None   Collection Time: 10/07/21 11:25 AM   Specimen: Nasopharyngeal Swab; Nasopharyngeal(NP) swabs in vial transport medium  Result Value Ref Range Status   SARS Coronavirus 2 by RT PCR NEGATIVE NEGATIVE Final    Comment: (NOTE) SARS-CoV-2 target nucleic acids are NOT DETECTED.  The SARS-CoV-2 RNA is generally detectable in upper respiratory specimens during the acute phase of infection. The lowest concentration of SARS-CoV-2 viral copies this assay can detect is 138 copies/mL. A negative result does not preclude SARS-Cov-2 infection and should not be used as the sole basis for treatment or other patient management decisions. A negative result may occur with  improper specimen collection/handling, submission of specimen other than nasopharyngeal swab, presence of viral mutation(s) within the areas targeted by this assay, and inadequate number of viral copies(<138 copies/mL). A negative result must be combined with clinical observations, patient history, and epidemiological information. The expected result is Negative.  Fact Sheet for Patients:  EntrepreneurPulse.com.au  Fact Sheet for Healthcare Providers:  IncredibleEmployment.be  This test is no t yet approved or cleared by the [place] FDA and  has been authorized for detection and/or diagnosis of SARS-CoV-2 by FDA under an Emergency Use Authorization (EUA). This  EUA will remain  in effect (meaning this test can be used) for the duration of the COVID-19 declaration under Section 564(b)(1) of the Act, 21 U.S.C.section 360bbb-3(b)(1), unless the authorization is terminated  or revoked sooner.       Influenza A by PCR NEGATIVE NEGATIVE Final   Influenza B by PCR NEGATIVE NEGATIVE Final    Comment: (NOTE) The Xpert  Xpress SARS-CoV-2/FLU/RSV plus assay is intended as an aid in the diagnosis of influenza from Nasopharyngeal swab specimens and should not be used as a sole basis for treatment. Nasal washings and aspirates are unacceptable for Xpert Xpress SARS-CoV-2/FLU/RSV testing.  Fact Sheet for Patients: EntrepreneurPulse.com.au  Fact Sheet for Healthcare Providers: IncredibleEmployment.be  This test is not yet approved or cleared by the [place] FDA and has been authorized for detection and/or diagnosis of SARS-CoV-2 by FDA under an Emergency Use Authorization (EUA). This EUA will remain in effect (meaning this test can be used) for the duration of the COVID-19 declaration under Section 564(b)(1) of the Act, 21 U.S.C. section 360bbb-3(b)(1), unless the authorization is terminated or revoked.  Performed at [facility], [place] [address], [place], [place] [address]   Resp Panel by RT-PCR (Flu A&B, Covid) Nasopharyngeal Swab     Status: None   Collection Time: 10/08/21  4:41 PM   Specimen: Nasopharyngeal Swab; Nasopharyngeal(NP) swabs in vial transport medium  Result Value Ref Range Status   SARS Coronavirus 2 by RT PCR NEGATIVE NEGATIVE Final    Comment: (NOTE) SARS-CoV-2 target nucleic acids are NOT DETECTED.  The SARS-CoV-2 RNA is generally detectable in upper respiratory specimens during the acute phase of infection. The lowest concentration of SARS-CoV-2 viral copies this assay can detect is 138 copies/mL. A negative result does not preclude SARS-Cov-2 infection and should not be used as the sole basis for treatment or other patient management decisions. A negative result may occur with  improper specimen collection/handling, submission of specimen other than nasopharyngeal swab, presence of viral mutation(s) within the areas targeted by this assay, and inadequate number of viral copies(<138 copies/mL). A negative result must be  combined with clinical observations, patient history, and epidemiological information. The expected result is Negative.  Fact Sheet for Patients:  EntrepreneurPulse.com.au  Fact Sheet for Healthcare Providers:  IncredibleEmployment.be  This test is no t yet approved or cleared by the [place] FDA and  has been authorized for detection and/or diagnosis of SARS-CoV-2 by FDA under an Emergency Use Authorization (EUA). This EUA will remain  in effect (meaning this test can be used) for the duration of the COVID-19 declaration under Section 564(b)(1) of the Act, 21 U.S.C.section 360bbb-3(b)(1), unless the authorization is terminated  or revoked sooner.       Influenza A by PCR NEGATIVE NEGATIVE Final   Influenza B by PCR NEGATIVE NEGATIVE Final    Comment: (NOTE) The Xpert Xpress SARS-CoV-2/FLU/RSV plus assay is intended as an aid in the diagnosis of influenza from Nasopharyngeal swab specimens and should not be used as a sole basis for treatment. Nasal washings and aspirates are unacceptable for Xpert Xpress SARS-CoV-2/FLU/RSV testing.  Fact Sheet for Patients: EntrepreneurPulse.com.au  Fact Sheet for Healthcare Providers: IncredibleEmployment.be  This test is not yet approved or cleared by the [place] FDA and has been authorized for detection and/or diagnosis of SARS-CoV-2 by FDA under an Emergency Use Authorization (EUA). This EUA will remain in effect (meaning this test can be used) for the duration of the COVID-19 declaration under Section 564(b)(1)  of the Act, 21 U.S.C. section 360bbb-3(b)(1), unless the authorization is terminated or revoked.  Performed at [facility], [place] [address], [place], [name] [address]     Please note: You were cared for by a hospitalist during your hospital stay. Once you are discharged, your primary care physician will handle any further medical  issues. Please note that NO REFILLS for any discharge medications will be authorized once you are discharged, as it is imperative that you return to your primary care physician (or establish a relationship with a primary care physician if you do not have one) for your post hospital discharge needs so that they can reassess your need for medications and monitor your lab values.    Time coordinating discharge: 40 minutes  SIGNED:   Shelly Coss, MD  [facility] 10/11/2021, 1:05 PM Pager [phone]  If 7PM-7AM, please contact night-coverage [email or web address] Password [email or web address]

## 2021-10-11 NOTE — Procedures (Signed)
[facility] Liaison Note  Notified by [facility] of patient/family request of [facility] Paliative services.  [facility] hospital liaison will follow patient for discharge disposition.   Please call with any questions/concerns.    Thank you for the opportunity to participate in this patient's care.   Daphene Calamity, MSW [facility] Liaison  [phone]

## 2021-10-11 NOTE — Progress Notes (Addendum)
Modified Barium Swallow Progress Note  Patient Details  Name: Darrell [name] MRN: [id number] Date of Birth: [birth year]  Today's Date: 10/11/2021  Modified Barium Swallow completed.  Full report located under Chart Review in the Imaging Section.  Brief recommendations include the following:  Clinical Impression  Pt seen for MBS to objectively assess swallow function and safety, and to identify least restrictive diet.  Pt presents with mild oral dysphagia, characterized by left anterior leakage, reduced mastication of solid textures, and premature spillage over tongue base. Generalized residue throughout the oral cavity was noted with solid texture, which cleared with additional dry swallows.  Pt presents with moderate pharyngeal dysphagia, characterized by trigger of the swallow reflex at the level of the vallecular sinus. Frank aspiration of nectar thick liquids was seen prior to initiation of the swallow reflex, with NO cough response elicited (SILENT ASPIRATION). Cued cough was significantly weak and ineffective. Mild diffuse residue was noted in the pharynx, which pt typically sensed and swallowed again to clear.  No penetration or aspiration observed on honey thick liquid, puree, or solid texture. There was intermittent backflow from the esophagus to the pharynx on several consistencies, however, esophageal sweep revealed it to be relatively clear without obvious stasis.   Recommend puree diet with honey thick liquids, meds crushed in puree. Pt will benefit from assistance with PO intake due to visual limitations and to conserve energy. Safe swallow precautions were sent with transport to post at [facility] SLP will follow up with family to provide education.  Recommend continued ST intervention at next venue of care for dysphagia treatment and continued education.    Swallow Evaluation Recommendations  SLP Diet Recommendations: Dysphagia 1 (Puree) solids;Honey thick liquids   Liquid  Administration via: Cup;Spoon;Straw   Medication Administration: Crushed with puree   Supervision: Full assist for feeding   Compensations: Minimize environmental distractions;Slow rate;Small sips/bites;Lingual sweep for clearance of pocketing;Monitor for anterior loss   Postural Changes: Seated upright at 90 degrees   Oral Care Recommendations: Oral care BID   Other Recommendations: Order thickener from [place]. [name], [facility], [place] Speech Language Pathologist Office: [phone]  [name], [place] 10/11/2021,10:41 AM

## 2021-10-11 NOTE — Care Management Important Message (Signed)
Important Message  Patient Details  Name: Darrell [name] MRN: [id number] Date of Birth: 02-02-1951   Medicare Important Message Given:  Yes     Rendy Lazard [place] Circle 10/11/2021, 12:57 PM

## 2021-10-11 NOTE — TOC Transition Note (Signed)
Transition of Care [facility]) - CM/SW Discharge Note   Patient Details  Name: Darrell [name] MRN: [id number] Date of Birth: Feb 15, 1951  [facility] of [facility] CM/SW Contact:  Pollie Friar, RN Phone Number: 10/11/2021, 10:39 AM   Clinical Narrative:    Patient discharging home with home health ST through [facility]. Information on the AVS. Pt has needed DME at home.  Wife has selected [facility] for palliative services at home. CM has sent referral to [place]. Information on the AVS. Pt transporting home via [facility]. Address verified and wife aware of timing. Bedside RN updated.     Final next level of care: Home w Home Health Services Barriers to Discharge: No Barriers Identified   Patient Goals and CMS Choice   CMS Medicare.gov Compare Post Acute Care list provided to:: Patient Represenative (must comment) Choice offered to / list presented to : Spouse  Discharge Placement                       Discharge Plan and Services   Discharge Planning Services: CM Consult                      HH Arranged: Speech Therapy [place] Agency: [place] Date [place]: 10/11/21   Representative spoke with at [place]: [place] ([place]) Interventions     Readmission Risk Interventions No flowsheet data found.

## 2021-10-11 NOTE — Progress Notes (Addendum)
Pharmacy Antibiotic Note  Darrell [name] is a 71 y.o. male admitted on 10/07/2021 with suspected aspiration pneumonia.  Pharmacy has been consulted for unasyn dosing. COVID/Flu neg. Afeb. WBC wnl, SCr trend down to 2.53   Plan: Continue Unasyn 3 g q12h Plan to discharge on Augmentin 1/17 F/u plan for azithromycin  Weight: 81.3 kg (179 lb 3.7 oz)  Temp (24hrs), Avg:98.8 F (37.1 C), Min:98 F (36.7 C), Max:99.4 F (37.4 C)  Recent Labs  Lab 10/07/21 1124 10/08/21 0515 10/08/21 1449 10/08/21 2021 10/09/21 0322 10/10/21 0125 10/10/21 0548 10/11/21 0247  WBC 13.4*  --  10.9*  --   --   --  7.6 7.2  CREATININE 2.57*   < > 3.47* 3.43* 3.50* 2.97* 2.90* 2.53*   < > = values in this interval not displayed.     Estimated Creatinine Clearance: 31.7 mL/min (A) (by C-G formula based on SCr of 2.53 mg/dL (H)).    Allergies  Allergen Reactions   Ace Inhibitors Other (See Comments)    Hyperkalemia   Doxycycline Other (See Comments)    Hiccups, cough, nausea and emesis, elevated liver enzymes, elevated eosinophils, SOB concerning for early DRESS syndrome    [place]and Hct [Candesartan Cilexetil-Hctz] Hives   Shellfish Allergy Hives    Antimicrobials this admission: 1/14 ampicillin/sulbactam >> PTA azithromycin >> 1/14 -PTA azithromycin three times weekly   Thank you for allowing [place] to participate in this patients care. Jens [place], PharmD 10/11/2021 12:40 PM  **Pharmacist phone directory can be found on [email or web address] listed under [place]**

## 2021-10-11 NOTE — Progress Notes (Signed)
[facility] Progress Note  Subjective: pt eval bedside - wife present, said he's continued to improve.  Had swallow eval this am - honey thick recommended.  No IVF running currently.  Pt not really participating in interview.   Vitals:   10/10/21 1949 10/10/21 2349 10/11/21 0300 10/11/21 0757  BP:   116/60 116/66  Pulse:   66 73  Resp:  14  12  Temp: 99.1 F (37.3 C) 98.9 F (37.2 C) 98.9 F (37.2 C) 99.4 F (37.4 C)  TempSrc: Axillary Axillary Axillary Oral  SpO2:    95%  Weight:   81.3 kg     Exam Lying flat in bed   Chronically ill appearing \ Chest rhonchi BL, less prominent and no coughing today  Cor reg no RG  Abd soft ntnd no ascites  GU  foley draining amber urine   Ext 1+ dependent thigh edema, 1+ BL hand edema and trace forearm much improved   Neuro as above      Home meds include - norvasc 5 qd, asa, lipitor, brestri aerosphere, keppra 500 bid, metoprolol 50 bid, flomax, eye gtts/ prns/ vits/ supps      Date                          Creat               eGFR    2018                         1.77- 2.66    2019                         1.78- 2.49    2020                         2.48- 4.32    2021                         2.09    July- sept 2022        1.73- 2.72        26- 42 ml/min, stage IIIb             Jan 13                      2.57    Jan 14                      2.94, 3.47      CXR 1/13 - IMPRESSION: Bibasilar atelectasis versus infiltrate.    CXR 1/14 - IMPRESSION: Persistent streaky perihilar opacities and possible more confluent airspace consolidation in the bilateral lung bases.     CT abd noncon 1/13 - Urinary Tract: enlarging now 2.6 cm indeterminate right renal anterior upper pole lesion, slowly increasing in size since 2017. Additional bilateral simple and complex renal cysts are not significantly changed. No renal calculi or hydronephrosis. Bladder is unremarkable.     UA pend      UNa 54, UCr 60 FeNa 1.8%          Assessment/  Plan: AKI on CKD 3b - b/l creatinine 1.73- 2.72, eGFR 26- 42 ml/min from fall 2022.  Creat 2.5 on admit then up to 3.4. CT abd showed no renal obstruction. UA with some  hematuria, +LE, rare bacteria; culture pending but foley sample.  FeNa 1.8% intrisic.  No nephrotoxins. Suspected AKI due to urinary retention, vol depletion, underlying CKD.  With foley in good UOP - will need TOV vs urology f/u.  Pt is a poor dialysis candidate due to severe comorbidities and debility, Dr. Jonnie Finner d/w pt's family. Creat improving.  Cont to follow daily labs.  Maintain hydration - no need for IVF at this time.  Was previously on PRN torsemide and I think that's fine for discharge.   Hyperkalemia - resolved now at 3.9 today. Metabolic acidosis - resolved HTN - BP's normal, dc'd norvasc and lowered metoprolol to 25 bid. Avoid over treatment, keep SBP > 120 for now w/ AKI.  PNA/ sepsis - no shock, on IV abx AMS - hx of seizure d/o, per neuro no evidence seizures  CIDP - outpt follows with Dr. Tillman Abide.  Anemia - 8.5 after transfusion.  No IV iron in setting of severe bacterial infection. Check iron stores and if replete give ESA.    Palliative following.  OK for d/c per primary - wife expressing wishes him to stay another day.  Will arrange clinic f/u with primary nephrologist in about 4-6 weeks.    Jannifer Hick MD [facility] Pager [phone]    Recent Labs  Lab 10/10/21 0125 10/10/21 0548 10/11/21 0247  K 4.9 4.4 3.9  BUN 97* 89* 73*  CREATININE 2.97* 2.90* 2.53*  CALCIUM 9.4 9.4 8.8*  PHOS 2.9  --  3.2  HGB  --  8.6* 8.5*    Inpatient medications:  amLODipine  5 mg Per Tube Daily   atorvastatin  40 mg Per Tube Daily   brimonidine  1 drop Both Eyes Daily   Chlorhexidine Gluconate Cloth  6 each Topical Daily   dorzolamide-timolol  1 drop Both Eyes Daily   glycopyrrolate  1 mg Per Tube BID   hydrOXYzine  25 mg Per Tube QHS   latanoprost  1 drop Both Eyes QHS    mometasone-formoterol  2 puff Inhalation BID   ondansetron (ZOFRAN) IV  4 mg Intravenous Once   pantoprazole  40 mg Intravenous Q12H   umeclidinium bromide  1 puff Inhalation Daily    ampicillin-sulbactam (UNASYN) IV 3 g (10/10/21 2224)   levETIRAcetam 500 mg (10/10/21 2308)   acetaminophen **OR** acetaminophen, bisacodyl, food thickener, guaiFENesin-dextromethorphan, hydrALAZINE, ondansetron **OR** ondansetron (ZOFRAN) IV

## 2021-10-11 NOTE — Progress Notes (Signed)
Speech Language Pathology Treatment: Dysphagia  Patient Details Name: Darrell [name] MRN: [id number] DOB: 09/01/1951 Today's Date: 10/11/2021 Time: 1045-1100 SLP Time Calculation (min) (ACUTE ONLY): 15 min  Assessment / Plan / Recommendation Clinical Impression  Met with pt's wife for education following MBS this morning. Video from MBS used to facilitate education. SLP provided samples of Simply Thick Honey consistency thickener, and reviewed process of thickening liquids appropriately. Wife was cautioned about use of ice in cold beverages, as melted ice thins the recommended consistency and increases aspiration risk. Wife was receptive to information regarding silent aspiration of nectar thick liquid necessitating honey thickness for all liquids. Education was provided regarding pureed solids, using chopper/food processor/blender and appropriate liquid (gravy, milk, water, butter) to create a palatable consistency. Pt did not have aspiration of solid textures on MBS, however, he appears significantly weak. Pureed solids at this time will be beneficial for energy conservation and to minimize aspiration risk related to fatigue. Home health speech therapy is recommended to follow up for continued education, diagnostic treatment, and to determine readiness to repeat MBS (given silent aspiration today). Wife was provided with the opportunity to ask questions, and was appreciative of the information presented.   HPI HPI: Darrell [name] is a 71 y.o. male with medical history significant of osteoarthritis, bradycardia, stage III CKD, dysphagia, GERD, glaucoma, hyperlipidemia history of other nonhemorrhagic CVA, hypertension, inguinal hernia, liver hemangioma muscular disorder, nuclear sclerosis of both eyes. Pt with coffee ground emesis x1 during admission, GI following.  Most recent MBS in 08/20 recommending dysphagia 1 solids and thin liquids with meds whole in puree.      SLP Plan  Continue with current  plan of care      Recommendations for follow up therapy are one component of a multi-disciplinary discharge planning process, led by the attending physician.  Recommendations may be updated based on patient status, additional functional criteria and insurance authorization.    Recommendations  Diet recommendations: Dysphagia 1 (puree);Honey-thick liquid Liquids provided via: Cup;Straw;Teaspoon Medication Administration: Crushed with puree Supervision: Full supervision/cueing for compensatory strategies Compensations: Minimize environmental distractions;Slow rate;Small sips/bites;Lingual sweep for clearance of pocketing;Monitor for anterior loss Postural Changes and/or Swallow Maneuvers: Seated upright 90 degrees;Upright 30-60 min after meal                Oral Care Recommendations: Oral care BID;Staff/trained caregiver to provide oral care Follow Up Recommendations: Home health SLP Assistance recommended at discharge: Frequent or constant Supervision/Assistance SLP Visit Diagnosis: Dysphagia, oropharyngeal phase (R13.12) Plan: Continue with current plan of care          Darrell [name] B. [name], [facility], [place] Speech Language Pathologist Office: [phone]  Darrell, [name]  10/11/2021, 11:06 AM

## 2021-10-12 ENCOUNTER — Telehealth: Payer: Self-pay

## 2021-10-12 DIAGNOSIS — G1220 Motor neuron disease, unspecified: Secondary | ICD-10-CM | POA: Diagnosis not present

## 2021-10-12 NOTE — Telephone Encounter (Signed)
Transition Care Management Follow-up Telephone Call   Date of discharge and from where:[facility] on 11/11/2021 How have you been since you were released from the hospital? Pt next by, phone on speaker , wife answered , DPR signed, wife  Any questions or concerns ? Wife verbalized concerns of possible nurse aid assistance. Items Reviewed: Did the pt receive and understand the discharge instructions provided? have the instructions and have no questions.  Medications obtained and verified? She said that they have the medication list  and the hospital staff reviewed them in detail prior to discharge. She said that he has all of the medications and they have no questions.  Any new allergies since your discharge? None reported  Do you have support at home? Yes, spouse Other (ie: DME, Home Health, etc)    Has  hospital bed, wheelchair,  shower chair   Functional Questionnaire: (I = Independent and D = Dependent) ADL's: with assistance      Follow up appointments reviewed:   PCP Hospital f/u appt confirmed? Dr Margarita Rana 10/13/2021@ 08:10.  [facility] f/u appt confirmed? None scheduled at this time  Are transportation arrangements needed? have transportation   If their condition worsens, is the pt aware to call  their PCP or go to the ED? Yes.Made pt aware if condition worsen or start experiencing rapid weight gain, chest pain, diff breathing, SOB, high fevers, or bleading to refer imediately to ED for further evaluation.  Was the patient provided with contact information for the PCP's office or ED? He has the phone number  Was the pt encouraged to call back with questions or concerns?yes

## 2021-10-13 ENCOUNTER — Ambulatory Visit: Payer: Medicare HMO | Attending: Family Medicine | Admitting: Family Medicine

## 2021-10-13 ENCOUNTER — Other Ambulatory Visit: Payer: Self-pay

## 2021-10-13 ENCOUNTER — Telehealth: Payer: Self-pay | Admitting: Family Medicine

## 2021-10-13 ENCOUNTER — Encounter: Payer: Self-pay | Admitting: Family Medicine

## 2021-10-13 DIAGNOSIS — N2889 Other specified disorders of kidney and ureter: Secondary | ICD-10-CM | POA: Diagnosis not present

## 2021-10-13 DIAGNOSIS — G1221 Amyotrophic lateral sclerosis: Secondary | ICD-10-CM

## 2021-10-13 DIAGNOSIS — N184 Chronic kidney disease, stage 4 (severe): Secondary | ICD-10-CM | POA: Diagnosis not present

## 2021-10-13 DIAGNOSIS — J42 Unspecified chronic bronchitis: Secondary | ICD-10-CM | POA: Diagnosis not present

## 2021-10-13 DIAGNOSIS — K921 Melena: Secondary | ICD-10-CM | POA: Diagnosis not present

## 2021-10-13 DIAGNOSIS — I10 Essential (primary) hypertension: Secondary | ICD-10-CM | POA: Diagnosis not present

## 2021-10-13 NOTE — Telephone Encounter (Signed)
I called [facility] #  [phone] and spoke to [place]. She confirmed that they have received the referral and will call patient's wife to explain their services.  I confirmed phone numbers for patient's wife.   I called patient's wife, [name], and explained the role of palliative care and informed her that [facility] will be contacting her.  Also scheduled patient for an in person visit with Dr Margarita Rana 10/27/2021 @ 1010. Patient needs labs done at that time. [name] said that she has family that will help her bring her husband to the appointment. Provided her with the new address for [facility] as of 10/24/2021.

## 2021-10-13 NOTE — Telephone Encounter (Signed)
Palliative care reached out to me regarding this patient to which I consented.  Can you please have [facility] contact his wife as she is a not clear about the services they will be providing.  He also has a virtual visit with me next week, can you please see if his visit can be rescheduled to some other time as he needs an in person visit with me so he can have repeat labs?  Thank you

## 2021-10-13 NOTE — Progress Notes (Signed)
Virtual Visit via Telephone Note  I connected with Darrell [name], on 10/13/2021 at 8:15 AM by telephone due to the COVID-19 pandemic and verified that I am speaking with the correct person using two identifiers.   Consent: I discussed the limitations, risks, security and privacy concerns of performing an evaluation and management service by telephone and the availability of in person appointments. I also discussed with the patient that there may be a patient responsible charge related to this service. The patient expressed understanding and agreed to proceed.   Location of Patient: Home  Location of Provider: Clinic   Persons participating in Telemedicine visit: RODD HEFT Spouse Dr. Margarita Rana     History of Present Illness: Darrell [name] is a 71 y.o. year old male with a history of hypertension, previous CVA with residual left-sided weakness, seizure in the setting of prior CVA, second degree AV block (status post medtronic dual chamber pacemaker) stage III CKD, motor neuron disease/ALS who is seen today for transition of care visit.  He had a hospitalization at [facility] from 10/07/2021 through 10/11/2021 after he presented with confusion, hypoxia, coffee-ground emesis and was found to be anemic with a hemoglobin of 6.3 requiring 2 units of PRBC, hyperkalemia which was treated, acute on chronic kidney injury. Chest x-ray revealed aspiration pneumonia, CT abdomen revealed constipation/renal impaction in addition to presence of a right renal mass which had increased in size.   CT abdomen and pelvis revealed: IMPRESSION: 1. Distended distal esophagus and stomach without obstruction. 2. Large rectosigmoid stool ball. Correlate for fecal impaction. 3. Enlarging now 2.6 cm indeterminate right renal anterior upper pole lesion, slowly increasing in size since 2017. Renal cell carcinoma is not excluded. Recommend non-emergent outpatient renal protocol MRI with and without  contrast for further evaluation. 4. Aortic Atherosclerosis (ICD10-I70.0).   His daily azithromycin was changed to Augmentin.  With regards to his anemia GI had no plans for endoscopy. CT head revealed no acute intracranial abnormalities but advanced chronic ischemic injury, brain atrophy. His aspirin was discontinued along with metoprolol, azithromycin and meloxicam and he was referred to palliative care.   His wife has questions about his hospitalization as she does not understand why his medications were discontinued.  He was placed on azithromycin prophylactically 3x/week prescribed by pulmonary prior to hospitalization. Since discharge he has not had any coffee-ground emesis and is doing well on his current regimen They have questions about palliative care as they do not understand the full implication.   Past Medical History:  Diagnosis Date   Arthritis    At high risk for falls 08/16/2015   Bradycardia    Chronic kidney disease    stage 3 GFR 30-59 ml/min    CIDP (chronic inflammatory demyelinating polyneuropathy) ([name])    CKD (chronic kidney disease) stage 3, GFR 30-59 ml/min ([name]) 08/16/2015   Dysphagia as late effect of cerebrovascular disease    pts wife states pt has to eat soft foods    Elevated liver enzymes 08/10/2016   GERD (gastroesophageal reflux disease)    Glaucoma    High cholesterol    History of CVA with residual deficit 03/25/2013   Hypertension    Hypertensive retinopathy of both eyes 01/16/2017   Inguinal hernia 03/25/2013   Liver hemangioma 08/14/2016   Neuromuscular disorder ([place])    chronic inflammatory demyelinating polyneuropathy    New onset seizure ([place]) 07/08/2017   seizure 07/14/18   Nuclear sclerosis of both eyes 10/25/2016   Presence  of permanent cardiac pacemaker    placed in april 2018   Primary open angle glaucoma of both eyes, indeterminate stage 10/25/2016   Renal mass, right 08/14/2016   Status cardiac pacemaker 01/29/2017   Placed for  second degree heart block on 01/16/17 Medtronic Azure XT DR MRI SureScan dual-chamber pacemaker   Stroke [facility])    2011 with residual deficit left sided weakness   Tobacco dependence    Allergies  Allergen Reactions   Ace Inhibitors Other (See Comments)    Hyperkalemia   Doxycycline Other (See Comments)    Hiccups, cough, nausea and emesis, elevated liver enzymes, elevated eosinophils, SOB concerning for early DRESS syndrome    Atacand Hct [Candesartan Cilexetil-Hctz] Hives   Shellfish Allergy Hives    Current Outpatient Medications on File Prior to Visit  Medication Sig Dispense Refill   albuterol (VENTOLIN HFA) 108 (90 Base) MCG/ACT inhaler Inhale 2 puffs into the lungs every 6 (six) hours as needed for up to 30 days for Wheezing. (Patient taking differently: Inhale 2 puffs into the lungs as needed for wheezing or shortness of breath.) 18 g 5   amLODipine (NORVASC) 5 MG tablet Take 1 tablet (5 mg total) by mouth daily. 90 tablet 1   atorvastatin (LIPITOR) 40 MG tablet Take 1 tablet (40 mg total) by mouth daily. 90 tablet 1   brimonidine (ALPHAGAN) 0.2 % ophthalmic solution Place 1 drop into both eyes 3 times daily.     Budeson-Glycopyrrol-Formoterol (BREZTRI AEROSPHERE) 160-9-4.8 MCG/ACT AERO Inhale 2 puffs into the lungs 2 (two) times daily. (Patient taking differently: Inhale 2 puffs into the lungs as needed (SOB, wheezing).) 10.7 g 6   cetirizine (ZYRTEC) 10 MG tablet Take 1 tablet (10 mg total) by mouth daily. 90 tablet 0   dicyclomine (BENTYL) 10 MG capsule TAKE 1 CAPSULE BY MOUTH IN THE MORNING AND AT BEDTIME (Patient taking differently: Take 10 mg by mouth daily.) 60 capsule 6   dorzolamide-timolol (COSOPT) 22.3-6.8 MG/ML ophthalmic solution Place 1 drop into both eyes daily.      ferrous sulfate (FEROSUL) 325 (65 FE) MG tablet Take 1 tablet (325 mg total) by mouth 2 (two) times daily with a meal. 60 tablet 2   fluticasone (FLONASE) 50 MCG/ACT nasal spray Place 2 sprays into both  nostrils daily. 16 g 3   glycopyrrolate (ROBINUL) 1 MG tablet Take 1 tablet (1 mg total) by mouth 2 times daily. 60 tablet 5   guaiFENesin-dextromethorphan (ROBITUSSIN DM) 100-10 MG/5ML syrup Take 5 mLs by mouth every 4 (four) hours as needed for cough. 236 mL 0   hydrOXYzine (ATARAX/VISTARIL) 25 MG tablet Take 1 tablet (25 mg total) by mouth at bedtime. 30 tablet 1   latanoprost (XALATAN) 0.005 % ophthalmic solution Place 1 drop into both eyes nightly.     levETIRAcetam (KEPPRA) 500 MG tablet Take 1 tablet (500 mg total) by mouth 2 (two) times daily. 180 tablet 4   lidocaine (LIDODERM) 5 % Apply patch to painful area. Patch may remain in place for up to 12 hours in a 24 hour period. 30 patch 0   Misc. Devices MISC Yankauer suction. Dx: ALS 1 each 0   Misc. Devices MISC Coughalator 1 each 0   Misc. Devices MISC Nebulizer machine.  Diagnosis- Motor neuron disease 1 each 0   Misc. Devices [facility] Bed [id number] 1 each 0   Misc. [facility] bed extension. Diagnosis: ALS. Height 6'4" 1 each 0   Misc. Devices MISC  Manual wheelchair.  Diagnosis-ALS 1 each 0   montelukast (SINGULAIR) 10 MG tablet Take 10 mg by mouth at bedtime.     Multiple Vitamin (MULTI-VITAMIN) tablet Take by mouth.     pantoprazole (PROTONIX) 40 MG tablet Take 1 tablet (40 mg total) by mouth 2 (two) times daily. 60 tablet 0   polyethylene glycol (MIRALAX) 17 g packet Take 17 g by mouth daily. 14 each 0   senna (SENOKOT) 8.6 MG TABS tablet Take 1 tablet (8.6 mg total) by mouth daily. 30 tablet 0   tamsulosin (FLOMAX) 0.4 MG CAPS capsule Take 1 capsule (0.4 mg total) by mouth daily. 90 capsule 1   torsemide (DEMADEX) 20 MG tablet Take 20 mg by mouth as needed (swelling).     No current facility-administered medications on file prior to visit.    ROS: See HPI  Observations/Objective: Awake, alert - at baseline Not in acute distress Coughing throughout exam Normal mood  CMP Latest Ref Rng & Units  10/11/2021 10/10/2021 10/10/2021  Glucose 70 - 99 mg/dL 112(H) 125(H) 116(H)  BUN 8 - 23 mg/dL 73(H) 89(H) 97(H)  Creatinine 0.61 - 1.24 mg/dL 2.53(H) 2.90(H) 2.97(H)  Sodium 135 - 145 mmol/L 144 147(H) 146(H)  Potassium 3.5 - 5.1 mmol/L 3.9 4.4 4.9  Chloride 98 - 111 mmol/L 111 112(H) 113(H)  CO2 22 - 32 mmol/L 26 26 23   Calcium 8.9 - 10.3 mg/dL 8.8(L) 9.4 9.4  Total Protein 6.5 - 8.1 g/dL - - -  Total Bilirubin 0.3 - 1.2 mg/dL - - -  Alkaline Phos 38 - 126 U/L - - -  AST 15 - 41 U/L - - -  ALT 0 - 44 U/L - - -    Lipid Panel     Component Value Date/Time   CHOL 112 06/13/2018 0954   TRIG 51 06/13/2018 0954   HDL 40 06/13/2018 0954   CHOLHDL 2.8 06/13/2018 0954   CHOLHDL 4.1 08/16/2015 1555   VLDL 23 08/16/2015 1555   LDLCALC 62 06/13/2018 0954   LABVLDL 10 06/13/2018 0954    Lab Results  Component Value Date   HGBA1C 5.1 08/20/2019    Assessment and Plan: 1. Renal mass, right Discussed with the patient and his wife findings of his CT scan and possible management options in the light of his progressive ALS Advised to discuss this with his family and also schedule an appointment with urology to discuss further - Ambulatory referral to Urology  2. ALS (amyotrophic lateral sclerosis) ([place]) Progressive Under palliative care I will have the case manager reach out to [facility] palliative care and so they can discuss with the family the scope of care informed the patient and spouse have questions. Currently under the care of [place] [facility]  3. CKD (chronic kidney disease), stage IV ([place]) He did have an acute on chronic kidney injury during hospitalization Discharge creatinine was 2.53 closer to baseline. Follow-up with nephrology  4. Essential hypertension He will need an in person visit to assess control Continue current antihypertensives and advised to hold off on metoprolol which was discontinued during hospitalization Counseled on blood pressure goal of less  than 130/80, low-sodium, DASH diet, medication compliance, 150 minutes of moderate intensity exercise per week. Discussed medication compliance, adverse effects.  5. Chronic bronchitis, unspecified chronic bronchitis type ([place]) Stable He does have ongoing secretions likely from a combination of his ALS and chronic bronchitis Prophylactic azithromycin which was prescribed by his pulmonologist was discontinued during his hospitalization He  is currently on Augmentin for CAP Advised to check with pulmonary once he is done with his Augmentin course if azithromycin needs to be reinitiated  6. Melena He has no coffee-ground emesis but has had chronic melena Will schedule an in person office visit so hemoglobin can be checked No GI intervention per hospital discharge notes   Follow Up Instructions: 2 weeks   I discussed the assessment and treatment plan with the patient. The patient was provided an opportunity to ask questions and all were answered. The patient agreed with the plan and demonstrated an understanding of the instructions.   The patient was advised to call back or seek an in-person evaluation if the symptoms worsen or if the condition fails to improve as anticipated.     I provided 17 minutes total of non-face-to-face time during this encounter.   [name], MD, FAAFP. [facility] [place], [place]   10/13/2021, 8:15 AM

## 2021-10-14 ENCOUNTER — Telehealth: Payer: Self-pay | Admitting: Family Medicine

## 2021-10-14 ENCOUNTER — Telehealth: Payer: Self-pay

## 2021-10-14 DIAGNOSIS — H35033 Hypertensive retinopathy, bilateral: Secondary | ICD-10-CM | POA: Diagnosis not present

## 2021-10-14 DIAGNOSIS — G40909 Epilepsy, unspecified, not intractable, without status epilepticus: Secondary | ICD-10-CM | POA: Diagnosis not present

## 2021-10-14 DIAGNOSIS — G9341 Metabolic encephalopathy: Secondary | ICD-10-CM | POA: Diagnosis not present

## 2021-10-14 DIAGNOSIS — R7303 Prediabetes: Secondary | ICD-10-CM | POA: Diagnosis not present

## 2021-10-14 DIAGNOSIS — N39498 Other specified urinary incontinence: Secondary | ICD-10-CM | POA: Diagnosis not present

## 2021-10-14 DIAGNOSIS — N401 Enlarged prostate with lower urinary tract symptoms: Secondary | ICD-10-CM | POA: Diagnosis not present

## 2021-10-14 DIAGNOSIS — R1312 Dysphagia, oropharyngeal phase: Secondary | ICD-10-CM | POA: Diagnosis not present

## 2021-10-14 DIAGNOSIS — I69364 Other paralytic syndrome following cerebral infarction affecting left non-dominant side: Secondary | ICD-10-CM | POA: Diagnosis not present

## 2021-10-14 DIAGNOSIS — Z950 Presence of cardiac pacemaker: Secondary | ICD-10-CM | POA: Diagnosis not present

## 2021-10-14 DIAGNOSIS — G6181 Chronic inflammatory demyelinating polyneuritis: Secondary | ICD-10-CM | POA: Diagnosis not present

## 2021-10-14 DIAGNOSIS — H401134 Primary open-angle glaucoma, bilateral, indeterminate stage: Secondary | ICD-10-CM | POA: Diagnosis not present

## 2021-10-14 DIAGNOSIS — N281 Cyst of kidney, acquired: Secondary | ICD-10-CM | POA: Diagnosis not present

## 2021-10-14 DIAGNOSIS — N400 Enlarged prostate without lower urinary tract symptoms: Secondary | ICD-10-CM | POA: Diagnosis not present

## 2021-10-14 DIAGNOSIS — J42 Unspecified chronic bronchitis: Secondary | ICD-10-CM | POA: Diagnosis not present

## 2021-10-14 DIAGNOSIS — Z87891 Personal history of nicotine dependence: Secondary | ICD-10-CM | POA: Diagnosis not present

## 2021-10-14 DIAGNOSIS — J9611 Chronic respiratory failure with hypoxia: Secondary | ICD-10-CM | POA: Diagnosis not present

## 2021-10-14 DIAGNOSIS — I69391 Dysphagia following cerebral infarction: Secondary | ICD-10-CM | POA: Diagnosis not present

## 2021-10-14 DIAGNOSIS — J189 Pneumonia, unspecified organism: Secondary | ICD-10-CM | POA: Diagnosis not present

## 2021-10-14 DIAGNOSIS — J9601 Acute respiratory failure with hypoxia: Secondary | ICD-10-CM | POA: Diagnosis not present

## 2021-10-14 DIAGNOSIS — K219 Gastro-esophageal reflux disease without esophagitis: Secondary | ICD-10-CM | POA: Diagnosis not present

## 2021-10-14 DIAGNOSIS — G1223 Primary lateral sclerosis: Secondary | ICD-10-CM | POA: Diagnosis not present

## 2021-10-14 DIAGNOSIS — I129 Hypertensive chronic kidney disease with stage 1 through stage 4 chronic kidney disease, or unspecified chronic kidney disease: Secondary | ICD-10-CM | POA: Diagnosis not present

## 2021-10-14 DIAGNOSIS — N184 Chronic kidney disease, stage 4 (severe): Secondary | ICD-10-CM | POA: Diagnosis not present

## 2021-10-14 DIAGNOSIS — D1803 Hemangioma of intra-abdominal structures: Secondary | ICD-10-CM | POA: Diagnosis not present

## 2021-10-14 DIAGNOSIS — H2513 Age-related nuclear cataract, bilateral: Secondary | ICD-10-CM | POA: Diagnosis not present

## 2021-10-14 DIAGNOSIS — N2889 Other specified disorders of kidney and ureter: Secondary | ICD-10-CM | POA: Diagnosis not present

## 2021-10-14 DIAGNOSIS — K5900 Constipation, unspecified: Secondary | ICD-10-CM | POA: Diagnosis not present

## 2021-10-14 DIAGNOSIS — R634 Abnormal weight loss: Secondary | ICD-10-CM | POA: Diagnosis not present

## 2021-10-14 DIAGNOSIS — D631 Anemia in chronic kidney disease: Secondary | ICD-10-CM | POA: Diagnosis not present

## 2021-10-14 DIAGNOSIS — I441 Atrioventricular block, second degree: Secondary | ICD-10-CM | POA: Diagnosis not present

## 2021-10-14 DIAGNOSIS — M1990 Unspecified osteoarthritis, unspecified site: Secondary | ICD-10-CM | POA: Diagnosis not present

## 2021-10-14 DIAGNOSIS — E782 Mixed hyperlipidemia: Secondary | ICD-10-CM | POA: Diagnosis not present

## 2021-10-14 DIAGNOSIS — I700 Atherosclerosis of aorta: Secondary | ICD-10-CM | POA: Diagnosis not present

## 2021-10-14 NOTE — Telephone Encounter (Signed)
Spoke with patient's wife [name] and scheduled a Televisit Palliative Consult for 10/18/21 @ 4:30PM with Dr. Hollace Kinnier. Documentation will be noted in [place].   Consent obtained; updated Outlook/Netsmart/Team List and Epic.

## 2021-10-14 NOTE — Telephone Encounter (Signed)
Verbal orders were left in [employer]

## 2021-10-14 NOTE — Telephone Encounter (Signed)
Referral Request - Has patient seen PCP for this complaint? Not sure *If NO, is insurance requiring patient see PCP for this issue before PCP can refer them? Referral for which specialty: speech Therapy Preferred provider/office:[facility] Reason for referral:1x2 2x3

## 2021-10-14 NOTE — Telephone Encounter (Signed)
Copied from [place] [phone]. Topic: Quick Communication - Home Health Verbal Orders >> Oct 14, 2021 10:27 AM Jodie Echevaria wrote: Caller/Agency: Moshe Salisbury with [facility]  Callback Number: [phone] ok to LM  Requesting OT/PT/Skilled Nursing/Social Work/Speech Therapy: need orders to add Skilled nursing for medication, PT for gait stability  Frequency: Speech 1 w 2, and 2 w 4

## 2021-10-18 ENCOUNTER — Telehealth: Payer: Self-pay | Admitting: Family Medicine

## 2021-10-18 DIAGNOSIS — G1221 Amyotrophic lateral sclerosis: Secondary | ICD-10-CM | POA: Diagnosis not present

## 2021-10-18 DIAGNOSIS — R1312 Dysphagia, oropharyngeal phase: Secondary | ICD-10-CM | POA: Diagnosis not present

## 2021-10-18 DIAGNOSIS — D3000 Benign neoplasm of unspecified kidney: Secondary | ICD-10-CM | POA: Diagnosis not present

## 2021-10-18 DIAGNOSIS — Z515 Encounter for palliative care: Secondary | ICD-10-CM | POA: Diagnosis not present

## 2021-10-18 NOTE — Telephone Encounter (Signed)
Home Health Verbal Orders - Caller/Agency: [facility] Number: [phone] Requesting OT/PT/Skilled Nursing/Social Work/Speech Therapy: Skilled Nursing  Frequency:   1w1 2w2 1w4 1 every other week 4 1 prn   Also catheter changes  For four weeks and as needed

## 2021-10-19 ENCOUNTER — Telehealth: Payer: Medicare HMO | Admitting: Family Medicine

## 2021-10-19 ENCOUNTER — Telehealth: Payer: Self-pay | Admitting: Family Medicine

## 2021-10-19 NOTE — Telephone Encounter (Signed)
Verbal order were given for patient. 

## 2021-10-19 NOTE — Telephone Encounter (Signed)
Copied from [place] [phone]. Topic: Quick Communication - Home Health Verbal Orders >> Oct 19, 2021  9:16 AM Jodie Echevaria wrote: Caller/Agency: [facility] Number: [phone] ok to LM  Requesting OT/PT/Skilled Nursing/Social Work/Speech Therapy: PT / Home Health  Frequency: 1 w 1, 2w 3, 1 w 4

## 2021-10-19 NOTE — Telephone Encounter (Signed)
Verbal orders were given for patient. 

## 2021-10-20 ENCOUNTER — Telehealth: Payer: Self-pay | Admitting: Family Medicine

## 2021-10-20 DIAGNOSIS — D49512 Neoplasm of unspecified behavior of left kidney: Secondary | ICD-10-CM | POA: Diagnosis not present

## 2021-10-20 DIAGNOSIS — R338 Other retention of urine: Secondary | ICD-10-CM | POA: Diagnosis not present

## 2021-10-20 DIAGNOSIS — D49511 Neoplasm of unspecified behavior of right kidney: Secondary | ICD-10-CM | POA: Diagnosis not present

## 2021-10-20 NOTE — Telephone Encounter (Signed)
Copied from [place] [phone]. Topic: Quick Communication - Home Health Verbal Orders >> Oct 20, 2021  1:03 PM Jodie Echevaria wrote: Caller/Agency: [place] Number: [phone] ok to LM  Requesting OT/PT/Skilled Nursing/Social Work/Speech Therapy: Nursing  Frequency: 1w1, 1eow 8 and 1 PRN,  Catheter change Q 4 week and as needed

## 2021-10-21 NOTE — Telephone Encounter (Signed)
Verbal orders given for :  OT/PT/Skilled Nursing/Social Work/Speech Therapy: Nursing  Frequency: 1w1, 1eow 8 and 1 PRN,  Catheter change Q 4 week and as needed

## 2021-10-25 DIAGNOSIS — G1221 Amyotrophic lateral sclerosis: Secondary | ICD-10-CM | POA: Diagnosis not present

## 2021-10-26 DIAGNOSIS — G1223 Primary lateral sclerosis: Secondary | ICD-10-CM | POA: Diagnosis not present

## 2021-10-26 DIAGNOSIS — Z8673 Personal history of transient ischemic attack (TIA), and cerebral infarction without residual deficits: Secondary | ICD-10-CM | POA: Diagnosis not present

## 2021-10-26 DIAGNOSIS — R471 Dysarthria and anarthria: Secondary | ICD-10-CM | POA: Diagnosis not present

## 2021-10-26 DIAGNOSIS — R252 Cramp and spasm: Secondary | ICD-10-CM | POA: Diagnosis not present

## 2021-10-26 DIAGNOSIS — R1310 Dysphagia, unspecified: Secondary | ICD-10-CM | POA: Diagnosis not present

## 2021-10-26 DIAGNOSIS — Z79899 Other long term (current) drug therapy: Secondary | ICD-10-CM | POA: Diagnosis not present

## 2021-10-27 ENCOUNTER — Ambulatory Visit: Payer: Medicare HMO | Attending: Family Medicine | Admitting: Family Medicine

## 2021-10-27 ENCOUNTER — Other Ambulatory Visit: Payer: Self-pay

## 2021-10-27 ENCOUNTER — Encounter: Payer: Self-pay | Admitting: Family Medicine

## 2021-10-27 VITALS — BP 162/90 | HR 93 | Ht 75.0 in

## 2021-10-27 DIAGNOSIS — I10 Essential (primary) hypertension: Secondary | ICD-10-CM

## 2021-10-27 DIAGNOSIS — N184 Chronic kidney disease, stage 4 (severe): Secondary | ICD-10-CM | POA: Diagnosis not present

## 2021-10-27 DIAGNOSIS — R059 Cough, unspecified: Secondary | ICD-10-CM | POA: Diagnosis not present

## 2021-10-27 DIAGNOSIS — G1223 Primary lateral sclerosis: Secondary | ICD-10-CM | POA: Diagnosis not present

## 2021-10-27 DIAGNOSIS — I69354 Hemiplegia and hemiparesis following cerebral infarction affecting left non-dominant side: Secondary | ICD-10-CM

## 2021-10-27 DIAGNOSIS — J3489 Other specified disorders of nose and nasal sinuses: Secondary | ICD-10-CM

## 2021-10-27 MED ORDER — CETIRIZINE HCL 10 MG PO TABS: 10.0000 mg | ORAL_TABLET | Freq: Every day | ORAL | 0 refills | Status: DC

## 2021-10-27 MED ORDER — GLYCOPYRROLATE 1 MG PO TABS: ORAL_TABLET | ORAL | 5 refills | Status: DC

## 2021-10-27 MED ORDER — AMLODIPINE BESYLATE 10 MG PO TABS: 10.0000 mg | ORAL_TABLET | Freq: Every day | ORAL | 1 refills | Status: DC

## 2021-10-27 MED ORDER — ATORVASTATIN CALCIUM 40 MG PO TABS: 40.0000 mg | ORAL_TABLET | Freq: Every day | ORAL | 1 refills | Status: DC

## 2021-10-27 NOTE — Progress Notes (Signed)
Subjective:  Patient ID: Darrell [name], male    DOB: [birth year]  Age: 71 y.o. MRN: [id number]  CC: Hospitalization Follow-up   HPI Darrell [name] is a 71 y.o. year old male with a history of hypertension, previous CVA with residual left-sided weakness, seizure in the setting of prior CVA, second degree AV block (status post medtronic dual chamber pacemaker) stage III CKD, motor neuron disease/ALS. He had a telehealth transition of care visit 2 weeks ago.  Interval History: He complains he takes too many medications.  He has an upcoming appointment with Dr Tammi Klippel, Urology for evaluation of right renal mass Also has an upcoming appointment with Nephrology.  Yesterday he had a telehealth visit with his [facility] neurologist and advised to use Robinul once daily rather than twice daily.  He has not been choking and his wife complains he does not like to use the thickener in his liquids.  He is able to swallow his tablets without crushing. He does not see his pulmonologist until later in the month. Endorses compliance with his medications but his blood pressure is elevated today.  He has no concerns today. Majority of the history is provided by the wife but he is able to answer questions regarding his review of systems. Past Medical History:  Diagnosis Date   Arthritis    At high risk for falls 08/16/2015   Bradycardia    Chronic kidney disease    stage 3 GFR 30-59 ml/min    CIDP (chronic inflammatory demyelinating polyneuropathy) ([name])    CKD (chronic kidney disease) stage 3, GFR 30-59 ml/min ([name]) 08/16/2015   Dysphagia as late effect of cerebrovascular disease    pts wife states pt has to eat soft foods    Elevated liver enzymes 08/10/2016   GERD (gastroesophageal reflux disease)    Glaucoma    High cholesterol    History of CVA with residual deficit 03/25/2013   Hypertension    Hypertensive retinopathy of both eyes 01/16/2017   Inguinal hernia 03/25/2013   Liver  hemangioma 08/14/2016   Neuromuscular disorder ([place])    chronic inflammatory demyelinating polyneuropathy    New onset seizure ([place]) 07/08/2017   seizure 07/14/18   Nuclear sclerosis of both eyes 10/25/2016   Presence of permanent cardiac pacemaker    placed in april 2018   Primary open angle glaucoma of both eyes, indeterminate stage 10/25/2016   Renal mass, right 08/14/2016   Status cardiac pacemaker 01/29/2017   Placed for second degree heart block on 01/16/17 Medtronic Azure XT DR MRI SureScan dual-chamber pacemaker   Stroke [facility])    2011 with residual deficit left sided weakness   Tobacco dependence     Past Surgical History:  Procedure Laterality Date   EYE SURGERY     HERNIA REPAIR     INGUINAL HERNIA REPAIR Right 11/23/2014   Procedure: right inguinal hernia repair with mesh;  Surgeon: Armandina Gemma, MD;  Location: [facility];  Service: General;  Laterality: Right;   INSERTION OF MESH N/A 11/23/2014   Procedure: INSERTION OF MESH;  Surgeon: Armandina Gemma, MD;  Location: [facility];  Service: General;  Laterality: N/A;   MASS EXCISION Left 08/29/2017   Procedure: EXCISION OF LEFT NECK MASS;  Surgeon: Coralie Keens, MD;  Location: [facility];  Service: General;  Laterality: Left;   PACEMAKER IMPLANT N/A 01/16/2017   Procedure: Pacemaker Implant;  Surgeon: Will Meredith Leeds, MD;  Location: [facility];  Service: Cardiovascular;  Laterality:  N/A;   SHOULDER SURGERY Bilateral 1988, 1998    Family History  Problem Relation Age of Onset   Hypertension Mother    Diabetes Sister    Hypertension Sister    Cancer Sister        1 sister   COPD Sister        in 1 sister   Stomach cancer Neg Hx    Colon cancer Neg Hx    Pancreatic cancer Neg Hx    Esophageal cancer Neg Hx     Allergies  Allergen Reactions   Ace Inhibitors Other (See Comments)    Hyperkalemia   Doxycycline Other (See Comments)    Hiccups, cough, nausea and emesis, elevated liver enzymes, elevated eosinophils, SOB  concerning for early DRESS syndrome    Atacand Hct [Candesartan Cilexetil-Hctz] Hives   Shellfish Allergy Hives    Outpatient Medications Prior to Visit  Medication Sig Dispense Refill   albuterol (VENTOLIN HFA) 108 (90 Base) MCG/ACT inhaler Inhale 2 puffs into the lungs every 6 (six) hours as needed for up to 30 days for Wheezing. (Patient taking differently: Inhale 2 puffs into the lungs as needed for wheezing or shortness of breath.) 18 g 5   brimonidine (ALPHAGAN) 0.2 % ophthalmic solution Place 1 drop into both eyes 3 times daily.     Budeson-Glycopyrrol-Formoterol (BREZTRI AEROSPHERE) 160-9-4.8 MCG/ACT AERO Inhale 2 puffs into the lungs 2 (two) times daily. (Patient taking differently: Inhale 2 puffs into the lungs as needed (SOB, wheezing).) 10.7 g 6   dicyclomine (BENTYL) 10 MG capsule TAKE 1 CAPSULE BY MOUTH IN THE MORNING AND AT BEDTIME (Patient taking differently: Take 10 mg by mouth daily.) 60 capsule 6   dorzolamide-timolol (COSOPT) 22.3-6.8 MG/ML ophthalmic solution Place 1 drop into both eyes daily.      ferrous sulfate (FEROSUL) 325 (65 FE) MG tablet Take 1 tablet (325 mg total) by mouth 2 (two) times daily with a meal. 60 tablet 2   fluticasone (FLONASE) 50 MCG/ACT nasal spray Place 2 sprays into both nostrils daily. 16 g 3   guaiFENesin-dextromethorphan (ROBITUSSIN DM) 100-10 MG/5ML syrup Take 5 mLs by mouth every 4 (four) hours as needed for cough. 236 mL 0   hydrOXYzine (ATARAX/VISTARIL) 25 MG tablet Take 1 tablet (25 mg total) by mouth at bedtime. 30 tablet 1   latanoprost (XALATAN) 0.005 % ophthalmic solution Place 1 drop into both eyes nightly.     levETIRAcetam (KEPPRA) 500 MG tablet Take 1 tablet (500 mg total) by mouth 2 (two) times daily. 180 tablet 4   lidocaine (LIDODERM) 5 % Apply patch to painful area. Patch may remain in place for up to 12 hours in a 24 hour period. 30 patch 0   Misc. Devices MISC Yankauer suction. Dx: ALS 1 each 0   Misc. Devices MISC Coughalator  1 each 0   Misc. Devices MISC Nebulizer machine.  Diagnosis- Motor neuron disease 1 each 0   Misc. Devices [facility] Bed DXg12.20 1 each 0   Misc. [facility] bed extension. Diagnosis: ALS. Height 6'4" 1 each 0   Misc. Devices MISC Manual wheelchair.  Diagnosis-ALS 1 each 0   montelukast (SINGULAIR) 10 MG tablet Take 10 mg by mouth at bedtime.     Multiple Vitamin (MULTI-VITAMIN) tablet Take by mouth.     pantoprazole (PROTONIX) 40 MG tablet Take 1 tablet (40 mg total) by mouth 2 (two) times daily. 60 tablet 0   polyethylene glycol (MIRALAX) 17 g  packet Take 17 g by mouth daily. 14 each 0   senna (SENOKOT) 8.6 MG TABS tablet Take 1 tablet (8.6 mg total) by mouth daily. 30 tablet 0   tamsulosin (FLOMAX) 0.4 MG CAPS capsule Take 1 capsule (0.4 mg total) by mouth daily. 90 capsule 1   torsemide (DEMADEX) 20 MG tablet Take 20 mg by mouth as needed (swelling).     amLODipine (NORVASC) 5 MG tablet Take 1 tablet (5 mg total) by mouth daily. 90 tablet 1   atorvastatin (LIPITOR) 40 MG tablet Take 1 tablet (40 mg total) by mouth daily. 90 tablet 1   cetirizine (ZYRTEC) 10 MG tablet Take 1 tablet (10 mg total) by mouth daily. 90 tablet 0   glycopyrrolate (ROBINUL) 1 MG tablet Take 1 tablet (1 mg total) by mouth 2 times daily. 60 tablet 5   No facility-administered medications prior to visit.     ROS Review of Systems  Constitutional:  Negative for activity change and appetite change.  HENT:  Negative for sinus pressure and sore throat.   Eyes:  Negative for visual disturbance.  Respiratory:  Negative for cough, chest tightness and shortness of breath.   Cardiovascular:  Negative for chest pain and leg swelling.  Gastrointestinal:  Negative for abdominal distention, abdominal pain, constipation and diarrhea.  Endocrine: Negative.   Genitourinary:  Negative for dysuria.  Musculoskeletal:  Negative for joint swelling and myalgias.  Skin:  Negative for rash.   Allergic/Immunologic: Negative.   Neurological:  Positive for weakness. Negative for light-headedness and numbness.  Psychiatric/Behavioral:  Negative for dysphoric mood and suicidal ideas.    Objective:  BP (!) 162/90    Pulse 93    Ht _0  (1.905 m)    SpO2 100%    BMI 21.41 kg/m   BP/Weight 10/27/2021 10/11/2021 35/46/5681  Systolic BP 275 170 017  Diastolic BP 90 80 72  Wt. (Lbs) - 171.3 -  BMI 21.41 21.13 22.2      Physical Exam Constitutional:      Appearance: He is well-developed.     Comments: Sitting in a wheelchair  Cardiovascular:     Rate and Rhythm: Normal rate.     Heart sounds: Normal heart sounds. No murmur heard. Pulmonary:     Effort: Pulmonary effort is normal.     Breath sounds: Normal breath sounds. No wheezing or rales.  Chest:     Chest wall: No tenderness.  Abdominal:     General: Bowel sounds are normal. There is no distension.     Palpations: Abdomen is soft. There is no mass.     Tenderness: There is no abdominal tenderness.  Musculoskeletal:     Right lower leg: No edema.     Left lower leg: No edema.  Neurological:     Mental Status: He is alert and oriented to person, place, and time. Mental status is at baseline.     Comments: Spasticity  Psychiatric:        Mood and Affect: Mood normal.    CMP Latest Ref Rng & Units 10/11/2021 10/10/2021 10/10/2021  Glucose 70 - 99 mg/dL 112(H) 125(H) 116(H)  BUN 8 - 23 mg/dL 73(H) 89(H) 97(H)  Creatinine 0.61 - 1.24 mg/dL 2.53(H) 2.90(H) 2.97(H)  Sodium 135 - 145 mmol/L 144 147(H) 146(H)  Potassium 3.5 - 5.1 mmol/L 3.9 4.4 4.9  Chloride 98 - 111 mmol/L 111 112(H) 113(H)  CO2 22 - 32 mmol/L _1 Calcium 8.9 - 10.3 mg/dL 8.8(L)  9.4 9.4  Total Protein 6.5 - 8.1 g/dL - - -  Total Bilirubin 0.3 - 1.2 mg/dL - - -  Alkaline Phos 38 - 126 U/L - - -  AST 15 - 41 U/L - - -  ALT 0 - 44 U/L - - -    Lipid Panel     Component Value Date/Time   CHOL 112 06/13/2018 0954   TRIG 51 06/13/2018 0954    HDL 40 06/13/2018 0954   CHOLHDL 2.8 06/13/2018 0954   CHOLHDL 4.1 08/16/2015 1555   VLDL 23 08/16/2015 1555   LDLCALC 62 06/13/2018 0954    CBC    Component Value Date/Time   WBC 7.2 10/11/2021 0247   RBC 2.75 (L) 10/11/2021 0247   HGB 8.5 (L) 10/11/2021 0247   HGB 11.8 (L) 06/08/2021 1050   HCT 26.1 (L) 10/11/2021 0247   HCT 35.2 (L) 06/08/2021 1050   PLT 149 (L) 10/11/2021 0247   PLT 204 06/08/2021 1050   MCV 94.9 10/11/2021 0247   MCV 90 06/08/2021 1050   MCH 30.9 10/11/2021 0247   MCHC 32.6 10/11/2021 0247   RDW 13.8 10/11/2021 0247   RDW 12.1 06/08/2021 1050   LYMPHSABS 1.4 10/11/2021 0247   LYMPHSABS 2.3 06/08/2021 1050   MONOABS 0.9 10/11/2021 0247   EOSABS 0.5 10/11/2021 0247   EOSABS 0.6 (H) 06/08/2021 1050   BASOSABS 0.0 10/11/2021 0247   BASOSABS 0.1 06/08/2021 1050    Lab Results  Component Value Date   HGBA1C 5.1 08/20/2019    Assessment & Plan:  1. Essential hypertension Uncontrolled Increased amlodipine dose Counseled on blood pressure goal of less than 130/80, low-sodium, DASH diet, medication compliance, 150 minutes of moderate intensity exercise per week. Discussed medication compliance, adverse effects. - amLODipine (NORVASC) 10 MG tablet; Take 1 tablet (10 mg total) by mouth daily.  Dispense: 90 tablet; Refill: 1  2. Hemiparesis affecting left side as late effect of stroke ([name]) Risk factor modification to prevent future cardiovascular events Continue statin - atorvastatin (LIPITOR) 40 MG tablet; Take 1 tablet (40 mg total) by mouth daily.  Dispense: 90 tablet; Refill: 1  3. Sinus drainage - cetirizine (ZYRTEC) 10 MG tablet; Take 1 tablet (10 mg total) by mouth daily.  Dispense: 90 tablet; Refill: 0  4. Primary lateral sclerosis ([name]) No evidence of aspiration at this time but he does cough up a lot of secretions Advised spouse to use suction and a Yankauer Drink liquids sips at a time and eat small portions at a time while sitting  upright - glycopyrrolate (ROBINUL) 1 MG tablet; Take 1 tablet (1 mg total) by mouth 2 times daily.  Dispense: 60 tablet; Refill: 5  5. Cough in adult patient Underlying COPD coupled with ongoing tobacco abuse also contributory in the setting of his excessive secretions from ALS He is not ready to quit smoking Continue with inhalers Follow-up with pulmonary - glycopyrrolate (ROBINUL) 1 MG tablet; Take 1 tablet (1 mg total) by mouth 2 times daily.  Dispense: 60 tablet; Refill: 5  6. CKD (chronic kidney disease), stage IV ([name]) Continue to follow-up with nephrology - CBC with Differential/Platelet - CMP14+EGFR    Meds ordered this encounter  Medications   amLODipine (NORVASC) 10 MG tablet    Sig: Take 1 tablet (10 mg total) by mouth daily.    Dispense:  90 tablet    Refill:  1    Dose increase   atorvastatin (LIPITOR) 40 MG tablet    Sig:  Take 1 tablet (40 mg total) by mouth daily.    Dispense:  90 tablet    Refill:  1   cetirizine (ZYRTEC) 10 MG tablet    Sig: Take 1 tablet (10 mg total) by mouth daily.    Dispense:  90 tablet    Refill:  0   glycopyrrolate (ROBINUL) 1 MG tablet    Sig: Take 1 tablet (1 mg total) by mouth 2 times daily.    Dispense:  60 tablet    Refill:  5    Follow-up: Return in about 3 months (around 01/24/2022) for Chronic medical conditions.       [name], MD, FAAFP. [facility] and [place], [place]   10/27/2021, 3:21 PM

## 2021-10-28 LAB — CBC WITH DIFFERENTIAL/PLATELET
Basophils Absolute: 0.1 10*3/uL (ref 0.0–0.2)
Basos: 1 %
EOS (ABSOLUTE): 0.5 10*3/uL — ABNORMAL HIGH (ref 0.0–0.4)
Eos: 7 %
Hematocrit: 30.5 % — ABNORMAL LOW (ref 37.5–51.0)
Hemoglobin: 10.3 g/dL — ABNORMAL LOW (ref 13.0–17.7)
Immature Grans (Abs): 0 10*3/uL (ref 0.0–0.1)
Immature Granulocytes: 0 %
Lymphocytes Absolute: 2.4 10*3/uL (ref 0.7–3.1)
Lymphs: 30 %
MCH: 29.7 pg (ref 26.6–33.0)
MCHC: 33.8 g/dL (ref 31.5–35.7)
MCV: 88 fL (ref 79–97)
Monocytes Absolute: 0.9 10*3/uL (ref 0.1–0.9)
Monocytes: 11 %
Neutrophils Absolute: 3.9 10*3/uL (ref 1.4–7.0)
Neutrophils: 51 %
Platelets: 300 10*3/uL (ref 150–450)
RBC: 3.47 x10E6/uL — ABNORMAL LOW (ref 4.14–5.80)
RDW: 12.8 % (ref 11.6–15.4)
WBC: 7.8 10*3/uL (ref 3.4–10.8)

## 2021-10-28 LAB — CMP14+EGFR
ALT: 12 IU/L (ref 0–44)
AST: 16 IU/L (ref 0–40)
Albumin/Globulin Ratio: 1.6 (ref 1.2–2.2)
Albumin: 4.1 g/dL (ref 3.8–4.8)
Alkaline Phosphatase: 108 IU/L (ref 44–121)
BUN/Creatinine Ratio: 11 (ref 10–24)
BUN: 28 mg/dL — ABNORMAL HIGH (ref 8–27)
Bilirubin Total: 0.3 mg/dL (ref 0.0–1.2)
CO2: 26 mmol/L (ref 20–29)
Calcium: 10.5 mg/dL — ABNORMAL HIGH (ref 8.6–10.2)
Chloride: 103 mmol/L (ref 96–106)
Creatinine, Ser: 2.54 mg/dL — ABNORMAL HIGH (ref 0.76–1.27)
Globulin, Total: 2.6 g/dL (ref 1.5–4.5)
Glucose: 106 mg/dL — ABNORMAL HIGH (ref 70–99)
Potassium: 5 mmol/L (ref 3.5–5.2)
Sodium: 141 mmol/L (ref 134–144)
Total Protein: 6.7 g/dL (ref 6.0–8.5)
eGFR: 27 mL/min/{1.73_m2} — ABNORMAL LOW (ref >59)

## 2021-11-04 ENCOUNTER — Telehealth: Payer: Self-pay | Admitting: Family Medicine

## 2021-11-04 ENCOUNTER — Telehealth: Payer: Self-pay

## 2021-11-04 NOTE — Telephone Encounter (Signed)
Patient name and DOB has been verified Patient was informed of lab results. Patient had no questions.  

## 2021-11-04 NOTE — Telephone Encounter (Signed)
-----   Message from [name], MD sent at 10/28/2021 11:07 AM EST ----- Please advise him that kidney function is abnormal but stable compared to previous.  He is still anemic but this has improved compared to when he was discharged.  Advised to continue follow-up with nephrology and continue his iron tablets.

## 2021-11-04 NOTE — Telephone Encounter (Signed)
Home Health Verbal Orders - Caller/Agency: Ashley Mariner with [facility] Number: [phone]  Requesting OT/PT/Skilled Nursing/Social Work/Speech Therapy: OT  Frequency: evaluation

## 2021-11-04 NOTE — Telephone Encounter (Signed)
Apolonio Schneiders was called and given verbal orders for the patient.

## 2021-11-11 DIAGNOSIS — N183 Chronic kidney disease, stage 3 unspecified: Secondary | ICD-10-CM | POA: Diagnosis not present

## 2021-11-11 DIAGNOSIS — I129 Hypertensive chronic kidney disease with stage 1 through stage 4 chronic kidney disease, or unspecified chronic kidney disease: Secondary | ICD-10-CM | POA: Diagnosis not present

## 2021-11-11 DIAGNOSIS — N2889 Other specified disorders of kidney and ureter: Secondary | ICD-10-CM | POA: Diagnosis not present

## 2021-11-12 DIAGNOSIS — G1220 Motor neuron disease, unspecified: Secondary | ICD-10-CM | POA: Diagnosis not present

## 2021-11-14 DIAGNOSIS — N184 Chronic kidney disease, stage 4 (severe): Secondary | ICD-10-CM | POA: Diagnosis not present

## 2021-11-14 DIAGNOSIS — E782 Mixed hyperlipidemia: Secondary | ICD-10-CM | POA: Diagnosis not present

## 2021-11-14 DIAGNOSIS — R1312 Dysphagia, oropharyngeal phase: Secondary | ICD-10-CM | POA: Diagnosis not present

## 2021-11-14 DIAGNOSIS — I69364 Other paralytic syndrome following cerebral infarction affecting left non-dominant side: Secondary | ICD-10-CM | POA: Diagnosis not present

## 2021-11-14 DIAGNOSIS — K219 Gastro-esophageal reflux disease without esophagitis: Secondary | ICD-10-CM | POA: Diagnosis not present

## 2021-11-14 DIAGNOSIS — H401134 Primary open-angle glaucoma, bilateral, indeterminate stage: Secondary | ICD-10-CM | POA: Diagnosis not present

## 2021-11-14 DIAGNOSIS — N2889 Other specified disorders of kidney and ureter: Secondary | ICD-10-CM | POA: Diagnosis not present

## 2021-11-14 DIAGNOSIS — M1990 Unspecified osteoarthritis, unspecified site: Secondary | ICD-10-CM | POA: Diagnosis not present

## 2021-11-14 DIAGNOSIS — R7303 Prediabetes: Secondary | ICD-10-CM | POA: Diagnosis not present

## 2021-11-14 DIAGNOSIS — H2513 Age-related nuclear cataract, bilateral: Secondary | ICD-10-CM | POA: Diagnosis not present

## 2021-11-14 DIAGNOSIS — G6181 Chronic inflammatory demyelinating polyneuritis: Secondary | ICD-10-CM | POA: Diagnosis not present

## 2021-11-14 DIAGNOSIS — J189 Pneumonia, unspecified organism: Secondary | ICD-10-CM | POA: Diagnosis not present

## 2021-11-14 DIAGNOSIS — I441 Atrioventricular block, second degree: Secondary | ICD-10-CM | POA: Diagnosis not present

## 2021-11-14 DIAGNOSIS — D631 Anemia in chronic kidney disease: Secondary | ICD-10-CM | POA: Diagnosis not present

## 2021-11-14 DIAGNOSIS — D1803 Hemangioma of intra-abdominal structures: Secondary | ICD-10-CM | POA: Diagnosis not present

## 2021-11-14 DIAGNOSIS — K5900 Constipation, unspecified: Secondary | ICD-10-CM | POA: Diagnosis not present

## 2021-11-14 DIAGNOSIS — Z87891 Personal history of nicotine dependence: Secondary | ICD-10-CM | POA: Diagnosis not present

## 2021-11-14 DIAGNOSIS — G1223 Primary lateral sclerosis: Secondary | ICD-10-CM | POA: Diagnosis not present

## 2021-11-14 DIAGNOSIS — G40909 Epilepsy, unspecified, not intractable, without status epilepticus: Secondary | ICD-10-CM | POA: Diagnosis not present

## 2021-11-14 DIAGNOSIS — I700 Atherosclerosis of aorta: Secondary | ICD-10-CM | POA: Diagnosis not present

## 2021-11-14 DIAGNOSIS — I129 Hypertensive chronic kidney disease with stage 1 through stage 4 chronic kidney disease, or unspecified chronic kidney disease: Secondary | ICD-10-CM | POA: Diagnosis not present

## 2021-11-14 DIAGNOSIS — I69391 Dysphagia following cerebral infarction: Secondary | ICD-10-CM | POA: Diagnosis not present

## 2021-11-14 DIAGNOSIS — G9341 Metabolic encephalopathy: Secondary | ICD-10-CM | POA: Diagnosis not present

## 2021-11-14 DIAGNOSIS — Z950 Presence of cardiac pacemaker: Secondary | ICD-10-CM | POA: Diagnosis not present

## 2021-11-16 DIAGNOSIS — G40909 Epilepsy, unspecified, not intractable, without status epilepticus: Secondary | ICD-10-CM | POA: Diagnosis not present

## 2021-11-16 DIAGNOSIS — G6181 Chronic inflammatory demyelinating polyneuritis: Secondary | ICD-10-CM | POA: Diagnosis not present

## 2021-11-16 DIAGNOSIS — G9341 Metabolic encephalopathy: Secondary | ICD-10-CM | POA: Diagnosis not present

## 2021-11-16 DIAGNOSIS — N184 Chronic kidney disease, stage 4 (severe): Secondary | ICD-10-CM | POA: Diagnosis not present

## 2021-11-16 DIAGNOSIS — K5900 Constipation, unspecified: Secondary | ICD-10-CM | POA: Diagnosis not present

## 2021-11-16 DIAGNOSIS — D631 Anemia in chronic kidney disease: Secondary | ICD-10-CM | POA: Diagnosis not present

## 2021-11-16 DIAGNOSIS — D1803 Hemangioma of intra-abdominal structures: Secondary | ICD-10-CM | POA: Diagnosis not present

## 2021-11-16 DIAGNOSIS — K219 Gastro-esophageal reflux disease without esophagitis: Secondary | ICD-10-CM | POA: Diagnosis not present

## 2021-11-16 DIAGNOSIS — I129 Hypertensive chronic kidney disease with stage 1 through stage 4 chronic kidney disease, or unspecified chronic kidney disease: Secondary | ICD-10-CM | POA: Diagnosis not present

## 2021-11-16 DIAGNOSIS — I69391 Dysphagia following cerebral infarction: Secondary | ICD-10-CM | POA: Diagnosis not present

## 2021-11-16 DIAGNOSIS — E782 Mixed hyperlipidemia: Secondary | ICD-10-CM | POA: Diagnosis not present

## 2021-11-16 DIAGNOSIS — J189 Pneumonia, unspecified organism: Secondary | ICD-10-CM | POA: Diagnosis not present

## 2021-11-16 DIAGNOSIS — N2889 Other specified disorders of kidney and ureter: Secondary | ICD-10-CM | POA: Diagnosis not present

## 2021-11-16 DIAGNOSIS — I441 Atrioventricular block, second degree: Secondary | ICD-10-CM | POA: Diagnosis not present

## 2021-11-16 DIAGNOSIS — H401134 Primary open-angle glaucoma, bilateral, indeterminate stage: Secondary | ICD-10-CM | POA: Diagnosis not present

## 2021-11-16 DIAGNOSIS — I69364 Other paralytic syndrome following cerebral infarction affecting left non-dominant side: Secondary | ICD-10-CM | POA: Diagnosis not present

## 2021-11-16 DIAGNOSIS — H2513 Age-related nuclear cataract, bilateral: Secondary | ICD-10-CM | POA: Diagnosis not present

## 2021-11-16 DIAGNOSIS — Z87891 Personal history of nicotine dependence: Secondary | ICD-10-CM | POA: Diagnosis not present

## 2021-11-16 DIAGNOSIS — I700 Atherosclerosis of aorta: Secondary | ICD-10-CM | POA: Diagnosis not present

## 2021-11-16 DIAGNOSIS — M1990 Unspecified osteoarthritis, unspecified site: Secondary | ICD-10-CM | POA: Diagnosis not present

## 2021-11-16 DIAGNOSIS — G1223 Primary lateral sclerosis: Secondary | ICD-10-CM | POA: Diagnosis not present

## 2021-11-16 DIAGNOSIS — R7303 Prediabetes: Secondary | ICD-10-CM | POA: Diagnosis not present

## 2021-11-16 DIAGNOSIS — Z950 Presence of cardiac pacemaker: Secondary | ICD-10-CM | POA: Diagnosis not present

## 2021-11-16 DIAGNOSIS — R1312 Dysphagia, oropharyngeal phase: Secondary | ICD-10-CM | POA: Diagnosis not present

## 2021-11-21 DIAGNOSIS — G1221 Amyotrophic lateral sclerosis: Secondary | ICD-10-CM | POA: Diagnosis not present

## 2021-11-22 DIAGNOSIS — G1221 Amyotrophic lateral sclerosis: Secondary | ICD-10-CM | POA: Diagnosis not present

## 2021-11-24 DIAGNOSIS — Z87891 Personal history of nicotine dependence: Secondary | ICD-10-CM | POA: Diagnosis not present

## 2021-11-24 DIAGNOSIS — G6181 Chronic inflammatory demyelinating polyneuritis: Secondary | ICD-10-CM | POA: Diagnosis not present

## 2021-11-24 DIAGNOSIS — H401134 Primary open-angle glaucoma, bilateral, indeterminate stage: Secondary | ICD-10-CM | POA: Diagnosis not present

## 2021-11-24 DIAGNOSIS — R1312 Dysphagia, oropharyngeal phase: Secondary | ICD-10-CM | POA: Diagnosis not present

## 2021-11-24 DIAGNOSIS — D1803 Hemangioma of intra-abdominal structures: Secondary | ICD-10-CM | POA: Diagnosis not present

## 2021-11-24 DIAGNOSIS — I69391 Dysphagia following cerebral infarction: Secondary | ICD-10-CM | POA: Diagnosis not present

## 2021-11-24 DIAGNOSIS — G1223 Primary lateral sclerosis: Secondary | ICD-10-CM | POA: Diagnosis not present

## 2021-11-24 DIAGNOSIS — D631 Anemia in chronic kidney disease: Secondary | ICD-10-CM | POA: Diagnosis not present

## 2021-11-24 DIAGNOSIS — E782 Mixed hyperlipidemia: Secondary | ICD-10-CM | POA: Diagnosis not present

## 2021-11-24 DIAGNOSIS — H2513 Age-related nuclear cataract, bilateral: Secondary | ICD-10-CM | POA: Diagnosis not present

## 2021-11-24 DIAGNOSIS — J189 Pneumonia, unspecified organism: Secondary | ICD-10-CM | POA: Diagnosis not present

## 2021-11-24 DIAGNOSIS — M1990 Unspecified osteoarthritis, unspecified site: Secondary | ICD-10-CM | POA: Diagnosis not present

## 2021-11-24 DIAGNOSIS — N184 Chronic kidney disease, stage 4 (severe): Secondary | ICD-10-CM | POA: Diagnosis not present

## 2021-11-24 DIAGNOSIS — N2889 Other specified disorders of kidney and ureter: Secondary | ICD-10-CM | POA: Diagnosis not present

## 2021-11-24 DIAGNOSIS — G9341 Metabolic encephalopathy: Secondary | ICD-10-CM | POA: Diagnosis not present

## 2021-11-24 DIAGNOSIS — K5900 Constipation, unspecified: Secondary | ICD-10-CM | POA: Diagnosis not present

## 2021-11-24 DIAGNOSIS — K219 Gastro-esophageal reflux disease without esophagitis: Secondary | ICD-10-CM | POA: Diagnosis not present

## 2021-11-24 DIAGNOSIS — I700 Atherosclerosis of aorta: Secondary | ICD-10-CM | POA: Diagnosis not present

## 2021-11-24 DIAGNOSIS — R7303 Prediabetes: Secondary | ICD-10-CM | POA: Diagnosis not present

## 2021-11-24 DIAGNOSIS — G40909 Epilepsy, unspecified, not intractable, without status epilepticus: Secondary | ICD-10-CM | POA: Diagnosis not present

## 2021-11-24 DIAGNOSIS — I441 Atrioventricular block, second degree: Secondary | ICD-10-CM | POA: Diagnosis not present

## 2021-11-24 DIAGNOSIS — Z950 Presence of cardiac pacemaker: Secondary | ICD-10-CM | POA: Diagnosis not present

## 2021-11-24 DIAGNOSIS — I129 Hypertensive chronic kidney disease with stage 1 through stage 4 chronic kidney disease, or unspecified chronic kidney disease: Secondary | ICD-10-CM | POA: Diagnosis not present

## 2021-11-24 DIAGNOSIS — I69364 Other paralytic syndrome following cerebral infarction affecting left non-dominant side: Secondary | ICD-10-CM | POA: Diagnosis not present

## 2021-12-01 ENCOUNTER — Telehealth: Payer: Self-pay | Admitting: Family Medicine

## 2021-12-01 DIAGNOSIS — I69391 Dysphagia following cerebral infarction: Secondary | ICD-10-CM | POA: Diagnosis not present

## 2021-12-01 DIAGNOSIS — N2889 Other specified disorders of kidney and ureter: Secondary | ICD-10-CM | POA: Diagnosis not present

## 2021-12-01 DIAGNOSIS — H401134 Primary open-angle glaucoma, bilateral, indeterminate stage: Secondary | ICD-10-CM | POA: Diagnosis not present

## 2021-12-01 DIAGNOSIS — D631 Anemia in chronic kidney disease: Secondary | ICD-10-CM | POA: Diagnosis not present

## 2021-12-01 DIAGNOSIS — Z950 Presence of cardiac pacemaker: Secondary | ICD-10-CM | POA: Diagnosis not present

## 2021-12-01 DIAGNOSIS — R1312 Dysphagia, oropharyngeal phase: Secondary | ICD-10-CM | POA: Diagnosis not present

## 2021-12-01 DIAGNOSIS — I700 Atherosclerosis of aorta: Secondary | ICD-10-CM | POA: Diagnosis not present

## 2021-12-01 DIAGNOSIS — D1803 Hemangioma of intra-abdominal structures: Secondary | ICD-10-CM | POA: Diagnosis not present

## 2021-12-01 DIAGNOSIS — E782 Mixed hyperlipidemia: Secondary | ICD-10-CM | POA: Diagnosis not present

## 2021-12-01 DIAGNOSIS — J189 Pneumonia, unspecified organism: Secondary | ICD-10-CM | POA: Diagnosis not present

## 2021-12-01 DIAGNOSIS — H2513 Age-related nuclear cataract, bilateral: Secondary | ICD-10-CM | POA: Diagnosis not present

## 2021-12-01 DIAGNOSIS — Z87891 Personal history of nicotine dependence: Secondary | ICD-10-CM | POA: Diagnosis not present

## 2021-12-01 DIAGNOSIS — I441 Atrioventricular block, second degree: Secondary | ICD-10-CM | POA: Diagnosis not present

## 2021-12-01 DIAGNOSIS — I69364 Other paralytic syndrome following cerebral infarction affecting left non-dominant side: Secondary | ICD-10-CM | POA: Diagnosis not present

## 2021-12-01 DIAGNOSIS — I129 Hypertensive chronic kidney disease with stage 1 through stage 4 chronic kidney disease, or unspecified chronic kidney disease: Secondary | ICD-10-CM | POA: Diagnosis not present

## 2021-12-01 DIAGNOSIS — M1990 Unspecified osteoarthritis, unspecified site: Secondary | ICD-10-CM | POA: Diagnosis not present

## 2021-12-01 DIAGNOSIS — G40909 Epilepsy, unspecified, not intractable, without status epilepticus: Secondary | ICD-10-CM | POA: Diagnosis not present

## 2021-12-01 DIAGNOSIS — K219 Gastro-esophageal reflux disease without esophagitis: Secondary | ICD-10-CM | POA: Diagnosis not present

## 2021-12-01 DIAGNOSIS — R7303 Prediabetes: Secondary | ICD-10-CM | POA: Diagnosis not present

## 2021-12-01 DIAGNOSIS — N184 Chronic kidney disease, stage 4 (severe): Secondary | ICD-10-CM | POA: Diagnosis not present

## 2021-12-01 DIAGNOSIS — K5900 Constipation, unspecified: Secondary | ICD-10-CM | POA: Diagnosis not present

## 2021-12-01 DIAGNOSIS — G9341 Metabolic encephalopathy: Secondary | ICD-10-CM | POA: Diagnosis not present

## 2021-12-01 DIAGNOSIS — G1223 Primary lateral sclerosis: Secondary | ICD-10-CM | POA: Diagnosis not present

## 2021-12-01 DIAGNOSIS — G6181 Chronic inflammatory demyelinating polyneuritis: Secondary | ICD-10-CM | POA: Diagnosis not present

## 2021-12-01 NOTE — Telephone Encounter (Signed)
Copied from [phone]. Topic: Quick Communication - Home Health Verbal Orders >> Dec 01, 2021 10:29 AM McGill, Nelva Bush wrote: Caller/Agency: [facility] Number: [phone]  Needs order from doctor Newlin for splint consultation for the right and left hand.  Order to [facility]: Prosthetics & Orthotics//Address: [address], [place], [place] [address] Phone: [phone]

## 2021-12-02 NOTE — Telephone Encounter (Signed)
Routing to PCP for review. ? ?Ok to give verbal ?

## 2021-12-03 NOTE — Telephone Encounter (Signed)
OK to give verbal order 

## 2021-12-05 ENCOUNTER — Other Ambulatory Visit: Payer: Self-pay

## 2021-12-05 ENCOUNTER — Ambulatory Visit: Payer: Medicare HMO | Admitting: Podiatry

## 2021-12-05 MED ORDER — MISC. DEVICES MISC: 0 refills | Status: DC

## 2021-12-05 NOTE — Telephone Encounter (Signed)
Script has been written and will be faxed to [facility] once PCP signs. ?

## 2021-12-08 ENCOUNTER — Telehealth: Payer: Self-pay | Admitting: *Deleted

## 2021-12-08 DIAGNOSIS — G6181 Chronic inflammatory demyelinating polyneuritis: Secondary | ICD-10-CM | POA: Diagnosis not present

## 2021-12-08 DIAGNOSIS — E782 Mixed hyperlipidemia: Secondary | ICD-10-CM | POA: Diagnosis not present

## 2021-12-08 DIAGNOSIS — H401134 Primary open-angle glaucoma, bilateral, indeterminate stage: Secondary | ICD-10-CM | POA: Diagnosis not present

## 2021-12-08 DIAGNOSIS — H2513 Age-related nuclear cataract, bilateral: Secondary | ICD-10-CM | POA: Diagnosis not present

## 2021-12-08 DIAGNOSIS — N2889 Other specified disorders of kidney and ureter: Secondary | ICD-10-CM | POA: Diagnosis not present

## 2021-12-08 DIAGNOSIS — I69364 Other paralytic syndrome following cerebral infarction affecting left non-dominant side: Secondary | ICD-10-CM | POA: Diagnosis not present

## 2021-12-08 DIAGNOSIS — R7303 Prediabetes: Secondary | ICD-10-CM | POA: Diagnosis not present

## 2021-12-08 DIAGNOSIS — I69391 Dysphagia following cerebral infarction: Secondary | ICD-10-CM | POA: Diagnosis not present

## 2021-12-08 DIAGNOSIS — I700 Atherosclerosis of aorta: Secondary | ICD-10-CM | POA: Diagnosis not present

## 2021-12-08 DIAGNOSIS — G1223 Primary lateral sclerosis: Secondary | ICD-10-CM | POA: Diagnosis not present

## 2021-12-08 DIAGNOSIS — K5900 Constipation, unspecified: Secondary | ICD-10-CM | POA: Diagnosis not present

## 2021-12-08 DIAGNOSIS — I441 Atrioventricular block, second degree: Secondary | ICD-10-CM | POA: Diagnosis not present

## 2021-12-08 DIAGNOSIS — J189 Pneumonia, unspecified organism: Secondary | ICD-10-CM | POA: Diagnosis not present

## 2021-12-08 DIAGNOSIS — D631 Anemia in chronic kidney disease: Secondary | ICD-10-CM | POA: Diagnosis not present

## 2021-12-08 DIAGNOSIS — K219 Gastro-esophageal reflux disease without esophagitis: Secondary | ICD-10-CM | POA: Diagnosis not present

## 2021-12-08 DIAGNOSIS — M1990 Unspecified osteoarthritis, unspecified site: Secondary | ICD-10-CM | POA: Diagnosis not present

## 2021-12-08 DIAGNOSIS — Z87891 Personal history of nicotine dependence: Secondary | ICD-10-CM | POA: Diagnosis not present

## 2021-12-08 DIAGNOSIS — N184 Chronic kidney disease, stage 4 (severe): Secondary | ICD-10-CM | POA: Diagnosis not present

## 2021-12-08 DIAGNOSIS — G40909 Epilepsy, unspecified, not intractable, without status epilepticus: Secondary | ICD-10-CM | POA: Diagnosis not present

## 2021-12-08 DIAGNOSIS — I129 Hypertensive chronic kidney disease with stage 1 through stage 4 chronic kidney disease, or unspecified chronic kidney disease: Secondary | ICD-10-CM | POA: Diagnosis not present

## 2021-12-08 DIAGNOSIS — G9341 Metabolic encephalopathy: Secondary | ICD-10-CM | POA: Diagnosis not present

## 2021-12-08 DIAGNOSIS — D1803 Hemangioma of intra-abdominal structures: Secondary | ICD-10-CM | POA: Diagnosis not present

## 2021-12-08 DIAGNOSIS — Z950 Presence of cardiac pacemaker: Secondary | ICD-10-CM | POA: Diagnosis not present

## 2021-12-08 DIAGNOSIS — R1312 Dysphagia, oropharyngeal phase: Secondary | ICD-10-CM | POA: Diagnosis not present

## 2021-12-08 NOTE — Telephone Encounter (Signed)
Late Entry for 12/07/21 @ 4:43p ?Called and left a message for patient's wife Eren Ryser to confirm visit for tomorrow 12/08/21 at 1:30p. Contact information left on voicemail. Awaiting return call for confirmation.  ? ?

## 2021-12-09 ENCOUNTER — Telehealth: Payer: Self-pay | Admitting: Family Medicine

## 2021-12-09 NOTE — Telephone Encounter (Signed)
Routing request to CMA of pt's PCP.  ?

## 2021-12-09 NOTE — Telephone Encounter (Signed)
Verbal orders were given for patient. 

## 2021-12-09 NOTE — Telephone Encounter (Signed)
Home Health Verbal Orders - Caller/Agency: Terri Piedra / [place]  ?Callback Number: [phone] vm can be left  ?Requesting PT  ?Frequency: 1x a week for 2 weeks  ?2xs a week for 4 weeks  ?1x a week for 2 weeks  ?

## 2021-12-10 DIAGNOSIS — G1220 Motor neuron disease, unspecified: Secondary | ICD-10-CM | POA: Diagnosis not present

## 2021-12-12 DIAGNOSIS — G6181 Chronic inflammatory demyelinating polyneuritis: Secondary | ICD-10-CM | POA: Diagnosis not present

## 2021-12-12 DIAGNOSIS — H2513 Age-related nuclear cataract, bilateral: Secondary | ICD-10-CM | POA: Diagnosis not present

## 2021-12-12 DIAGNOSIS — K5900 Constipation, unspecified: Secondary | ICD-10-CM | POA: Diagnosis not present

## 2021-12-12 DIAGNOSIS — I441 Atrioventricular block, second degree: Secondary | ICD-10-CM | POA: Diagnosis not present

## 2021-12-12 DIAGNOSIS — R7303 Prediabetes: Secondary | ICD-10-CM | POA: Diagnosis not present

## 2021-12-12 DIAGNOSIS — I69391 Dysphagia following cerebral infarction: Secondary | ICD-10-CM | POA: Diagnosis not present

## 2021-12-12 DIAGNOSIS — K219 Gastro-esophageal reflux disease without esophagitis: Secondary | ICD-10-CM | POA: Diagnosis not present

## 2021-12-12 DIAGNOSIS — N2889 Other specified disorders of kidney and ureter: Secondary | ICD-10-CM | POA: Diagnosis not present

## 2021-12-12 DIAGNOSIS — I129 Hypertensive chronic kidney disease with stage 1 through stage 4 chronic kidney disease, or unspecified chronic kidney disease: Secondary | ICD-10-CM | POA: Diagnosis not present

## 2021-12-12 DIAGNOSIS — R1312 Dysphagia, oropharyngeal phase: Secondary | ICD-10-CM | POA: Diagnosis not present

## 2021-12-12 DIAGNOSIS — N184 Chronic kidney disease, stage 4 (severe): Secondary | ICD-10-CM | POA: Diagnosis not present

## 2021-12-12 DIAGNOSIS — H401134 Primary open-angle glaucoma, bilateral, indeterminate stage: Secondary | ICD-10-CM | POA: Diagnosis not present

## 2021-12-12 DIAGNOSIS — Z87891 Personal history of nicotine dependence: Secondary | ICD-10-CM | POA: Diagnosis not present

## 2021-12-12 DIAGNOSIS — G40909 Epilepsy, unspecified, not intractable, without status epilepticus: Secondary | ICD-10-CM | POA: Diagnosis not present

## 2021-12-12 DIAGNOSIS — M1990 Unspecified osteoarthritis, unspecified site: Secondary | ICD-10-CM | POA: Diagnosis not present

## 2021-12-12 DIAGNOSIS — D1803 Hemangioma of intra-abdominal structures: Secondary | ICD-10-CM | POA: Diagnosis not present

## 2021-12-12 DIAGNOSIS — G1223 Primary lateral sclerosis: Secondary | ICD-10-CM | POA: Diagnosis not present

## 2021-12-12 DIAGNOSIS — D631 Anemia in chronic kidney disease: Secondary | ICD-10-CM | POA: Diagnosis not present

## 2021-12-12 DIAGNOSIS — G9341 Metabolic encephalopathy: Secondary | ICD-10-CM | POA: Diagnosis not present

## 2021-12-12 DIAGNOSIS — I69364 Other paralytic syndrome following cerebral infarction affecting left non-dominant side: Secondary | ICD-10-CM | POA: Diagnosis not present

## 2021-12-12 DIAGNOSIS — E782 Mixed hyperlipidemia: Secondary | ICD-10-CM | POA: Diagnosis not present

## 2021-12-12 DIAGNOSIS — I700 Atherosclerosis of aorta: Secondary | ICD-10-CM | POA: Diagnosis not present

## 2021-12-12 DIAGNOSIS — J189 Pneumonia, unspecified organism: Secondary | ICD-10-CM | POA: Diagnosis not present

## 2021-12-12 DIAGNOSIS — Z950 Presence of cardiac pacemaker: Secondary | ICD-10-CM | POA: Diagnosis not present

## 2021-12-13 DIAGNOSIS — D4102 Neoplasm of uncertain behavior of left kidney: Secondary | ICD-10-CM | POA: Diagnosis not present

## 2021-12-13 DIAGNOSIS — D4101 Neoplasm of uncertain behavior of right kidney: Secondary | ICD-10-CM | POA: Diagnosis not present

## 2021-12-13 DIAGNOSIS — R338 Other retention of urine: Secondary | ICD-10-CM | POA: Diagnosis not present

## 2021-12-15 DIAGNOSIS — H401134 Primary open-angle glaucoma, bilateral, indeterminate stage: Secondary | ICD-10-CM | POA: Diagnosis not present

## 2021-12-15 DIAGNOSIS — H35033 Hypertensive retinopathy, bilateral: Secondary | ICD-10-CM | POA: Diagnosis not present

## 2021-12-15 DIAGNOSIS — I700 Atherosclerosis of aorta: Secondary | ICD-10-CM | POA: Diagnosis not present

## 2021-12-15 DIAGNOSIS — G1223 Primary lateral sclerosis: Secondary | ICD-10-CM | POA: Diagnosis not present

## 2021-12-15 DIAGNOSIS — N184 Chronic kidney disease, stage 4 (severe): Secondary | ICD-10-CM | POA: Diagnosis not present

## 2021-12-15 DIAGNOSIS — N400 Enlarged prostate without lower urinary tract symptoms: Secondary | ICD-10-CM | POA: Diagnosis not present

## 2021-12-15 DIAGNOSIS — E782 Mixed hyperlipidemia: Secondary | ICD-10-CM | POA: Diagnosis not present

## 2021-12-15 DIAGNOSIS — K5900 Constipation, unspecified: Secondary | ICD-10-CM | POA: Diagnosis not present

## 2021-12-15 DIAGNOSIS — R1312 Dysphagia, oropharyngeal phase: Secondary | ICD-10-CM | POA: Diagnosis not present

## 2021-12-15 DIAGNOSIS — Z87891 Personal history of nicotine dependence: Secondary | ICD-10-CM | POA: Diagnosis not present

## 2021-12-15 DIAGNOSIS — G40909 Epilepsy, unspecified, not intractable, without status epilepticus: Secondary | ICD-10-CM | POA: Diagnosis not present

## 2021-12-15 DIAGNOSIS — M1990 Unspecified osteoarthritis, unspecified site: Secondary | ICD-10-CM | POA: Diagnosis not present

## 2021-12-15 DIAGNOSIS — R634 Abnormal weight loss: Secondary | ICD-10-CM | POA: Diagnosis not present

## 2021-12-15 DIAGNOSIS — I441 Atrioventricular block, second degree: Secondary | ICD-10-CM | POA: Diagnosis not present

## 2021-12-15 DIAGNOSIS — K219 Gastro-esophageal reflux disease without esophagitis: Secondary | ICD-10-CM | POA: Diagnosis not present

## 2021-12-15 DIAGNOSIS — N281 Cyst of kidney, acquired: Secondary | ICD-10-CM | POA: Diagnosis not present

## 2021-12-15 DIAGNOSIS — H2513 Age-related nuclear cataract, bilateral: Secondary | ICD-10-CM | POA: Diagnosis not present

## 2021-12-15 DIAGNOSIS — D1803 Hemangioma of intra-abdominal structures: Secondary | ICD-10-CM | POA: Diagnosis not present

## 2021-12-15 DIAGNOSIS — D631 Anemia in chronic kidney disease: Secondary | ICD-10-CM | POA: Diagnosis not present

## 2021-12-15 DIAGNOSIS — I129 Hypertensive chronic kidney disease with stage 1 through stage 4 chronic kidney disease, or unspecified chronic kidney disease: Secondary | ICD-10-CM | POA: Diagnosis not present

## 2021-12-15 DIAGNOSIS — Z950 Presence of cardiac pacemaker: Secondary | ICD-10-CM | POA: Diagnosis not present

## 2021-12-15 DIAGNOSIS — G6181 Chronic inflammatory demyelinating polyneuritis: Secondary | ICD-10-CM | POA: Diagnosis not present

## 2021-12-15 DIAGNOSIS — G9341 Metabolic encephalopathy: Secondary | ICD-10-CM | POA: Diagnosis not present

## 2021-12-15 DIAGNOSIS — I69391 Dysphagia following cerebral infarction: Secondary | ICD-10-CM | POA: Diagnosis not present

## 2021-12-19 ENCOUNTER — Ambulatory Visit: Payer: Medicare HMO | Admitting: Diagnostic Neuroimaging

## 2021-12-19 ENCOUNTER — Encounter: Payer: Self-pay | Admitting: Diagnostic Neuroimaging

## 2021-12-19 VITALS — BP 153/97 | HR 95 | Ht 75.0 in

## 2021-12-19 DIAGNOSIS — R569 Unspecified convulsions: Secondary | ICD-10-CM

## 2021-12-19 DIAGNOSIS — D1803 Hemangioma of intra-abdominal structures: Secondary | ICD-10-CM | POA: Diagnosis not present

## 2021-12-19 DIAGNOSIS — R634 Abnormal weight loss: Secondary | ICD-10-CM | POA: Diagnosis not present

## 2021-12-19 DIAGNOSIS — E782 Mixed hyperlipidemia: Secondary | ICD-10-CM | POA: Diagnosis not present

## 2021-12-19 DIAGNOSIS — I441 Atrioventricular block, second degree: Secondary | ICD-10-CM | POA: Diagnosis not present

## 2021-12-19 DIAGNOSIS — R1312 Dysphagia, oropharyngeal phase: Secondary | ICD-10-CM | POA: Diagnosis not present

## 2021-12-19 DIAGNOSIS — H401134 Primary open-angle glaucoma, bilateral, indeterminate stage: Secondary | ICD-10-CM | POA: Diagnosis not present

## 2021-12-19 DIAGNOSIS — H2513 Age-related nuclear cataract, bilateral: Secondary | ICD-10-CM | POA: Diagnosis not present

## 2021-12-19 DIAGNOSIS — G9341 Metabolic encephalopathy: Secondary | ICD-10-CM | POA: Diagnosis not present

## 2021-12-19 DIAGNOSIS — Z87891 Personal history of nicotine dependence: Secondary | ICD-10-CM | POA: Diagnosis not present

## 2021-12-19 DIAGNOSIS — I700 Atherosclerosis of aorta: Secondary | ICD-10-CM | POA: Diagnosis not present

## 2021-12-19 DIAGNOSIS — N184 Chronic kidney disease, stage 4 (severe): Secondary | ICD-10-CM | POA: Diagnosis not present

## 2021-12-19 DIAGNOSIS — G1223 Primary lateral sclerosis: Secondary | ICD-10-CM | POA: Diagnosis not present

## 2021-12-19 DIAGNOSIS — N400 Enlarged prostate without lower urinary tract symptoms: Secondary | ICD-10-CM | POA: Diagnosis not present

## 2021-12-19 DIAGNOSIS — I69391 Dysphagia following cerebral infarction: Secondary | ICD-10-CM | POA: Diagnosis not present

## 2021-12-19 DIAGNOSIS — G40909 Epilepsy, unspecified, not intractable, without status epilepticus: Secondary | ICD-10-CM | POA: Diagnosis not present

## 2021-12-19 DIAGNOSIS — K219 Gastro-esophageal reflux disease without esophagitis: Secondary | ICD-10-CM | POA: Diagnosis not present

## 2021-12-19 DIAGNOSIS — N281 Cyst of kidney, acquired: Secondary | ICD-10-CM | POA: Diagnosis not present

## 2021-12-19 DIAGNOSIS — G1220 Motor neuron disease, unspecified: Secondary | ICD-10-CM | POA: Diagnosis not present

## 2021-12-19 DIAGNOSIS — G6181 Chronic inflammatory demyelinating polyneuritis: Secondary | ICD-10-CM | POA: Diagnosis not present

## 2021-12-19 DIAGNOSIS — Z950 Presence of cardiac pacemaker: Secondary | ICD-10-CM | POA: Diagnosis not present

## 2021-12-19 DIAGNOSIS — D631 Anemia in chronic kidney disease: Secondary | ICD-10-CM | POA: Diagnosis not present

## 2021-12-19 DIAGNOSIS — I129 Hypertensive chronic kidney disease with stage 1 through stage 4 chronic kidney disease, or unspecified chronic kidney disease: Secondary | ICD-10-CM | POA: Diagnosis not present

## 2021-12-19 DIAGNOSIS — H35033 Hypertensive retinopathy, bilateral: Secondary | ICD-10-CM | POA: Diagnosis not present

## 2021-12-19 DIAGNOSIS — M1990 Unspecified osteoarthritis, unspecified site: Secondary | ICD-10-CM | POA: Diagnosis not present

## 2021-12-19 DIAGNOSIS — K5900 Constipation, unspecified: Secondary | ICD-10-CM | POA: Diagnosis not present

## 2021-12-19 MED ORDER — LEVETIRACETAM 500 MG PO TABS: 500.0000 mg | ORAL_TABLET | Freq: Two times a day (BID) | ORAL | 4 refills | Status: DC

## 2021-12-19 NOTE — Progress Notes (Signed)
? ? ? ?[facility] ? ?PATIENT: Darrell [name] ?DOB: 11-13-1950 ? ?REFERRING CLINICIAN: [name], MD  ?HISTORY FROM: patient and wife ?REASON FOR VISIT: follow up ? ? ?HISTORICAL ? ?CHIEF COMPLAINT:  ?Chief Complaint  ?Patient presents with  ? Seizures  ?  Rm 7 one year FU wife- [name]  "doing pretty good, no seizure activity"  ? ? ?HISTORY OF PRESENT ILLNESS:  ? ?UPDATE (12/19/21, VRP): Since last visit, doing about the same; no seizures. Neuromuscular sxs stable.  ? ?UPDATE (12/13/20, VRP): Since last visit, continued progression of weakness. No seizures. No alleviating or aggravating factors. Tolerating meds. ? ?UPDATE (12/15/19, VRP): Since last visit, no seizures. Some progrssion of motor neuron dz.  Tolerating meds. ? ?UPDATE (05/20/19, VRP): Since last visit, was in the hospital for choking episode, AKI, constipation. Now slightly better. No seizures. Muscle weakness progressing. Dysphagia / dysarthria worsening.   ? ?UPDATE (11/11/18, VRP): Since last visit, had seizure on 10/29/18; depakote was increased to '1250mg'$  twice a day. then could not afford medication, so we changed levetiracetam to '500mg'$  twice a day. Now tolerating meds, and more affordable.  ? ?UPDATE (07/24/18, VRP): Since last visit, has had 2nd opinion at [facility], and now likely has ALS or PLS diagnosis. Symptoms are progressive. Severity is severe. No alleviating or aggravating factors. Has follow up in [facility]. ? ?Also had 2 more seizures on 07/14/18; VPA level at ER was undetectable. Given IV loading dose and sent home.  ? ?UPDATE (03/19/18, VRP): Since last visit, doing well. No seizures. No alleviating or aggravating factors. More muscle stiffness.  ? ?NEW HPI (09/13/17, VRP): 71 year old male with CIDP, here for seizure evaluation. Since last visit, patient declined IVIG therapy that I had ordered in 2015, and then patient lost to followup. Gait and balance continue to decline. 20lb weight loss over last year. Now with new  onset seizure in Oct 2018 at home (witnessed by wife). Admitted to hospital and started on levetiracetam, then changed to divalproex '500mg'$  twice a day, and doing well. No further seizures. No alleviating or aggravating factors.  ? ?UPDATE 03/30/14: Since last visit, patient feels strength is stable. Still with slurred speech, swallow diff, balance diff. Now he is interested in IVIG treatment. Completed PT sessions. ? ?UPDATE 11/25/13: Since last visit patient had EMG and spinal tap. Findings are suspicious for CIDP. Patient's symptoms have gradually progressed. Continues to have more upper and lower extremity weakness, dysarthria, dysphasia and intermittent muscle twitching. Patient is frustrated today and does not want to take anymore medication. Patient's wife would like to find out more options for treatment. ? ?UPDATE 10/03/13: Since last visit, continues to have left sided weakness, worsening speech and swallow difficulty. Is drinking half pint Etoh per 1-2 weeks. Cigarette use has increased to 2 packs per day. ? ?PRIOR HPI (04/03/13): 71 year old right-handed male with hypertension, hypercholesterolemia, smoking, alcohol abuse, here for evaluation of gait and balance difficulty for past one year. ? ?2011 patient had left arm and left leg weakness, diagnosed with stroke. Apparently he was admitted to hospital and treated. He had fairly good recovery initial one year after stroke. He required using a walking stick but otherwise was fairly mobile. Unfortunately he did not have good medical followup at that time. Patient was abusing alcohol significantly at that time, ranging from 1 pint up to a fifth of alcohol per day. In the past one to 2 years he has cut down on his drinking, now averaging  1 pint of alcohol per week. ? ?In the past one year, patient's mobility is significantly deteriorated. He also has increasing slurred speech, trouble swallowing, balance and gait difficulty. He continues to fall down. Symptoms are  worse when he drinks alcohol. Nowadays he is unable to stand up and walk across a room without assistance. This is been the case for the past one year. ? ?REVIEW OF SYSTEMS: Full 14 system review of systems performed and negative except: as per HPI.  ? ? ?ALLERGIES: ?Allergies  ?Allergen Reactions  ? Ace Inhibitors Other (See Comments)  ?  Hyperkalemia  ? Doxycycline Other (See Comments)  ?  Hiccups, cough, nausea and emesis, elevated liver enzymes, elevated eosinophils, SOB concerning for early DRESS syndrome   ? Atacand Hct [Candesartan Cilexetil-Hctz] Hives  ? Shellfish Allergy Hives  ? ? ?HOME MEDICATIONS: ?Outpatient Medications Prior to Visit  ?Medication Sig Dispense Refill  ? albuterol (VENTOLIN HFA) 108 (90 Base) MCG/ACT inhaler Inhale 2 puffs into the lungs every 6 (six) hours as needed for up to 30 days for Wheezing. (Patient taking differently: Inhale 2 puffs into the lungs as needed for wheezing or shortness of breath.) 18 g 5  ? amLODipine (NORVASC) 10 MG tablet Take 1 tablet (10 mg total) by mouth daily. 90 tablet 1  ? atorvastatin (LIPITOR) 40 MG tablet Take 1 tablet (40 mg total) by mouth daily. 90 tablet 1  ? brimonidine (ALPHAGAN) 0.2 % ophthalmic solution Place 1 drop into both eyes 3 times daily.    ? Budeson-Glycopyrrol-Formoterol (BREZTRI AEROSPHERE) 160-9-4.8 MCG/ACT AERO Inhale 2 puffs into the lungs 2 (two) times daily. (Patient taking differently: Inhale 2 puffs into the lungs as needed (SOB, wheezing).) 10.7 g 6  ? cetirizine (ZYRTEC) 10 MG tablet Take 1 tablet (10 mg total) by mouth daily. 90 tablet 0  ? dicyclomine (BENTYL) 10 MG capsule TAKE 1 CAPSULE BY MOUTH IN THE MORNING AND AT BEDTIME (Patient taking differently: Take 10 mg by mouth daily.) 60 capsule 6  ? dorzolamide-timolol (COSOPT) 22.3-6.8 MG/ML ophthalmic solution Place 1 drop into both eyes daily.     ? ferrous sulfate (FEROSUL) 325 (65 FE) MG tablet Take 1 tablet (325 mg total) by mouth 2 (two) times daily with a meal. 60  tablet 2  ? finasteride (PROSCAR) 5 MG tablet Take 5 mg by mouth daily.    ? fluticasone (FLONASE) 50 MCG/ACT nasal spray Place 2 sprays into both nostrils daily. 16 g 3  ? glycopyrrolate (ROBINUL) 1 MG tablet Take 1 tablet (1 mg total) by mouth 2 times daily. 60 tablet 5  ? guaiFENesin-dextromethorphan (ROBITUSSIN DM) 100-10 MG/5ML syrup Take 5 mLs by mouth every 4 (four) hours as needed for cough. 236 mL 0  ? hydrOXYzine (ATARAX/VISTARIL) 25 MG tablet Take 1 tablet (25 mg total) by mouth at bedtime. 30 tablet 1  ? latanoprost (XALATAN) 0.005 % ophthalmic solution Place 1 drop into both eyes nightly.    ? lidocaine (LIDODERM) 5 % Apply patch to painful area. Patch may remain in place for up to 12 hours in a 24 hour period. 30 patch 0  ? Misc. Devices MISC Yankauer suction. Dx: ALS 1 each 0  ? Misc. Devices MISC Coughalator 1 each 0  ? Misc. Devices MISC Nebulizer machine.  Diagnosis- Motor neuron disease 1 each 0  ? Misc. Devices [facility] Bed ?DXg12.20 1 each 0  ? Misc. [facility] [facility] bed extension. Diagnosis: ALS. Height 6'4" 1 each 0  ?  Misc. Devices MISC Manual wheelchair.  Diagnosis-ALS 1 each 0  ? Misc. Devices MISC Right and left wrist splint 1 each 0  ? montelukast (SINGULAIR) 10 MG tablet Take 10 mg by mouth at bedtime.    ? Multiple Vitamin (MULTI-VITAMIN) tablet Take by mouth.    ? pantoprazole (PROTONIX) 40 MG tablet Take 1 tablet (40 mg total) by mouth 2 (two) times daily. 60 tablet 0  ? polyethylene glycol (MIRALAX) 17 g packet Take 17 g by mouth daily. 14 each 0  ? senna (SENOKOT) 8.6 MG TABS tablet Take 1 tablet (8.6 mg total) by mouth daily. 30 tablet 0  ? tamsulosin (FLOMAX) 0.4 MG CAPS capsule Take 1 capsule (0.4 mg total) by mouth daily. 90 capsule 1  ? torsemide (DEMADEX) 20 MG tablet Take 20 mg by mouth as needed (swelling).    ? levETIRAcetam (KEPPRA) 500 MG tablet Take 1 tablet (500 mg total) by mouth 2 (two) times daily. 180 tablet 4  ? ?No facility-administered  medications prior to visit.  ? ? ?PAST MEDICAL HISTORY: ?Past Medical History:  ?Diagnosis Date  ? Arthritis   ? At high risk for falls 08/16/2015  ? Bradycardia   ? Chronic kidney disease   ? stage 3 GFR 3

## 2021-12-20 ENCOUNTER — Ambulatory Visit (INDEPENDENT_AMBULATORY_CARE_PROVIDER_SITE_OTHER): Payer: Medicare HMO

## 2021-12-20 DIAGNOSIS — I441 Atrioventricular block, second degree: Secondary | ICD-10-CM | POA: Diagnosis not present

## 2021-12-20 LAB — CUP PACEART REMOTE DEVICE CHECK
Battery Remaining Longevity: 76 mo
Battery Voltage: 2.97 V
Brady Statistic AP VP Percent: 6.92 %
Brady Statistic AP VS Percent: 3.23 %
Brady Statistic AS VP Percent: 48.7 %
Brady Statistic AS VS Percent: 41.16 %
Brady Statistic RA Percent Paced: 11.64 %
Brady Statistic RV Percent Paced: 55.62 %
Date Time Interrogation Session: 20230328021514
Implantable Lead Implant Date: 20180424
Implantable Lead Implant Date: 20180424
Implantable Lead Location: 753859
Implantable Lead Location: 753860
Implantable Lead Model: 5076
Implantable Lead Model: 5076
Implantable Pulse Generator Implant Date: 20180424
Lead Channel Impedance Value: 266 Ohm
Lead Channel Impedance Value: 285 Ohm
Lead Channel Impedance Value: 361 Ohm
Lead Channel Impedance Value: 380 Ohm
Lead Channel Pacing Threshold Amplitude: 0.5 V
Lead Channel Pacing Threshold Amplitude: 0.625 V
Lead Channel Pacing Threshold Pulse Width: 0.4 ms
Lead Channel Pacing Threshold Pulse Width: 0.4 ms
Lead Channel Sensing Intrinsic Amplitude: 2.375 mV
Lead Channel Sensing Intrinsic Amplitude: 2.625 mV
Lead Channel Sensing Intrinsic Amplitude: 6.5 mV
Lead Channel Sensing Intrinsic Amplitude: 6.5 mV
Lead Channel Setting Pacing Amplitude: 1.5 V
Lead Channel Setting Pacing Amplitude: 2.5 V
Lead Channel Setting Pacing Pulse Width: 0.4 ms
Lead Channel Setting Sensing Sensitivity: 2 mV

## 2021-12-23 DIAGNOSIS — G1221 Amyotrophic lateral sclerosis: Secondary | ICD-10-CM | POA: Diagnosis not present

## 2021-12-26 ENCOUNTER — Ambulatory Visit: Payer: Medicare HMO | Admitting: Podiatry

## 2021-12-27 DIAGNOSIS — M24542 Contracture, left hand: Secondary | ICD-10-CM | POA: Diagnosis not present

## 2021-12-27 DIAGNOSIS — M24541 Contracture, right hand: Secondary | ICD-10-CM | POA: Diagnosis not present

## 2021-12-28 DIAGNOSIS — G6181 Chronic inflammatory demyelinating polyneuritis: Secondary | ICD-10-CM | POA: Diagnosis not present

## 2021-12-28 DIAGNOSIS — H35033 Hypertensive retinopathy, bilateral: Secondary | ICD-10-CM | POA: Diagnosis not present

## 2021-12-28 DIAGNOSIS — K5900 Constipation, unspecified: Secondary | ICD-10-CM | POA: Diagnosis not present

## 2021-12-28 DIAGNOSIS — E782 Mixed hyperlipidemia: Secondary | ICD-10-CM | POA: Diagnosis not present

## 2021-12-28 DIAGNOSIS — I700 Atherosclerosis of aorta: Secondary | ICD-10-CM | POA: Diagnosis not present

## 2021-12-28 DIAGNOSIS — H2513 Age-related nuclear cataract, bilateral: Secondary | ICD-10-CM | POA: Diagnosis not present

## 2021-12-28 DIAGNOSIS — Z87891 Personal history of nicotine dependence: Secondary | ICD-10-CM | POA: Diagnosis not present

## 2021-12-28 DIAGNOSIS — K219 Gastro-esophageal reflux disease without esophagitis: Secondary | ICD-10-CM | POA: Diagnosis not present

## 2021-12-28 DIAGNOSIS — M1990 Unspecified osteoarthritis, unspecified site: Secondary | ICD-10-CM | POA: Diagnosis not present

## 2021-12-28 DIAGNOSIS — D1803 Hemangioma of intra-abdominal structures: Secondary | ICD-10-CM | POA: Diagnosis not present

## 2021-12-28 DIAGNOSIS — G9341 Metabolic encephalopathy: Secondary | ICD-10-CM | POA: Diagnosis not present

## 2021-12-28 DIAGNOSIS — H401134 Primary open-angle glaucoma, bilateral, indeterminate stage: Secondary | ICD-10-CM | POA: Diagnosis not present

## 2021-12-28 DIAGNOSIS — R1312 Dysphagia, oropharyngeal phase: Secondary | ICD-10-CM | POA: Diagnosis not present

## 2021-12-28 DIAGNOSIS — I129 Hypertensive chronic kidney disease with stage 1 through stage 4 chronic kidney disease, or unspecified chronic kidney disease: Secondary | ICD-10-CM | POA: Diagnosis not present

## 2021-12-28 DIAGNOSIS — G1223 Primary lateral sclerosis: Secondary | ICD-10-CM | POA: Diagnosis not present

## 2021-12-28 DIAGNOSIS — I69391 Dysphagia following cerebral infarction: Secondary | ICD-10-CM | POA: Diagnosis not present

## 2021-12-28 DIAGNOSIS — N400 Enlarged prostate without lower urinary tract symptoms: Secondary | ICD-10-CM | POA: Diagnosis not present

## 2021-12-28 DIAGNOSIS — G40909 Epilepsy, unspecified, not intractable, without status epilepticus: Secondary | ICD-10-CM | POA: Diagnosis not present

## 2021-12-28 DIAGNOSIS — R634 Abnormal weight loss: Secondary | ICD-10-CM | POA: Diagnosis not present

## 2021-12-28 DIAGNOSIS — I441 Atrioventricular block, second degree: Secondary | ICD-10-CM | POA: Diagnosis not present

## 2021-12-28 DIAGNOSIS — D631 Anemia in chronic kidney disease: Secondary | ICD-10-CM | POA: Diagnosis not present

## 2021-12-28 DIAGNOSIS — N184 Chronic kidney disease, stage 4 (severe): Secondary | ICD-10-CM | POA: Diagnosis not present

## 2021-12-28 DIAGNOSIS — N281 Cyst of kidney, acquired: Secondary | ICD-10-CM | POA: Diagnosis not present

## 2021-12-28 DIAGNOSIS — Z950 Presence of cardiac pacemaker: Secondary | ICD-10-CM | POA: Diagnosis not present

## 2021-12-30 ENCOUNTER — Ambulatory Visit: Payer: Self-pay

## 2021-12-30 DIAGNOSIS — N400 Enlarged prostate without lower urinary tract symptoms: Secondary | ICD-10-CM | POA: Diagnosis not present

## 2021-12-30 DIAGNOSIS — I129 Hypertensive chronic kidney disease with stage 1 through stage 4 chronic kidney disease, or unspecified chronic kidney disease: Secondary | ICD-10-CM | POA: Diagnosis not present

## 2021-12-30 DIAGNOSIS — N184 Chronic kidney disease, stage 4 (severe): Secondary | ICD-10-CM | POA: Diagnosis not present

## 2021-12-30 DIAGNOSIS — H2513 Age-related nuclear cataract, bilateral: Secondary | ICD-10-CM | POA: Diagnosis not present

## 2021-12-30 DIAGNOSIS — K5900 Constipation, unspecified: Secondary | ICD-10-CM | POA: Diagnosis not present

## 2021-12-30 DIAGNOSIS — M1990 Unspecified osteoarthritis, unspecified site: Secondary | ICD-10-CM | POA: Diagnosis not present

## 2021-12-30 DIAGNOSIS — G9341 Metabolic encephalopathy: Secondary | ICD-10-CM | POA: Diagnosis not present

## 2021-12-30 DIAGNOSIS — G40909 Epilepsy, unspecified, not intractable, without status epilepticus: Secondary | ICD-10-CM | POA: Diagnosis not present

## 2021-12-30 DIAGNOSIS — Z87891 Personal history of nicotine dependence: Secondary | ICD-10-CM | POA: Diagnosis not present

## 2021-12-30 DIAGNOSIS — D1803 Hemangioma of intra-abdominal structures: Secondary | ICD-10-CM | POA: Diagnosis not present

## 2021-12-30 DIAGNOSIS — G6181 Chronic inflammatory demyelinating polyneuritis: Secondary | ICD-10-CM | POA: Diagnosis not present

## 2021-12-30 DIAGNOSIS — H401134 Primary open-angle glaucoma, bilateral, indeterminate stage: Secondary | ICD-10-CM | POA: Diagnosis not present

## 2021-12-30 DIAGNOSIS — N281 Cyst of kidney, acquired: Secondary | ICD-10-CM | POA: Diagnosis not present

## 2021-12-30 DIAGNOSIS — K219 Gastro-esophageal reflux disease without esophagitis: Secondary | ICD-10-CM | POA: Diagnosis not present

## 2021-12-30 DIAGNOSIS — R1312 Dysphagia, oropharyngeal phase: Secondary | ICD-10-CM | POA: Diagnosis not present

## 2021-12-30 DIAGNOSIS — I700 Atherosclerosis of aorta: Secondary | ICD-10-CM | POA: Diagnosis not present

## 2021-12-30 DIAGNOSIS — I441 Atrioventricular block, second degree: Secondary | ICD-10-CM | POA: Diagnosis not present

## 2021-12-30 DIAGNOSIS — G1223 Primary lateral sclerosis: Secondary | ICD-10-CM | POA: Diagnosis not present

## 2021-12-30 DIAGNOSIS — I69391 Dysphagia following cerebral infarction: Secondary | ICD-10-CM | POA: Diagnosis not present

## 2021-12-30 DIAGNOSIS — R634 Abnormal weight loss: Secondary | ICD-10-CM | POA: Diagnosis not present

## 2021-12-30 DIAGNOSIS — E782 Mixed hyperlipidemia: Secondary | ICD-10-CM | POA: Diagnosis not present

## 2021-12-30 DIAGNOSIS — D631 Anemia in chronic kidney disease: Secondary | ICD-10-CM | POA: Diagnosis not present

## 2021-12-30 DIAGNOSIS — H35033 Hypertensive retinopathy, bilateral: Secondary | ICD-10-CM | POA: Diagnosis not present

## 2021-12-30 DIAGNOSIS — Z950 Presence of cardiac pacemaker: Secondary | ICD-10-CM | POA: Diagnosis not present

## 2021-12-30 NOTE — Telephone Encounter (Signed)
? ?  Chief Complaint: High blood pressure ?Symptoms: Pt had BP of 170/90 at 11am with OT. Retaken 11:50 - 156/85 ?Frequency: ongoing ?Pertinent Negatives: Patient denies No breathing issues no chest pain ?Disposition: '[]'$ ED /'[]'$ Urgent Care (no appt availability in office) / '[]'$ Appointment(In office/virtual)/ '[]'$  [facility]/ '[]'$ Home Care/ '[]'$ Refused Recommended Disposition /'[]'$  Mobile Bus/ '[x]'$  Follow-up with PCP ?Additional Notes: No appointments available. Pt has appointment already set for early May. Wife is not able to do a virtual appointment as she has no email access. Wife states that husband is also more irritable lately. ? ? ? ?Reason for Disposition ? Systolic BP  >= 563 OR Diastolic >= 149 ? ?Answer Assessment - Initial Assessment Questions ?1. BLOOD PRESSURE: "What is the blood pressure?" "Did you take at least two measurements 5 minutes apart?" ?    11 AM 170/90 - 11:50 156/85 - pulse 71 ?2. ONSET: "When did you take your blood pressure?" ?    11 am and 11:50 am ?3. HOW: "How did you obtain the blood pressure?" (e.g., visiting nurse, automatic home BP monitor) ?    Automatic ?4. HISTORY: "Do you have a history of high blood pressure?" ?    yes ?5. MEDICATIONS: "Are you taking any medications for blood pressure?" "Have you missed any doses recently?" ?    Yes 10:30 am ?6. OTHER SYMPTOMS: "Do you have any symptoms?" (e.g., headache, chest pain, blurred vision, difficulty breathing, weakness) ?    no ?7. PREGNANCY: "Is there any chance you are pregnant?" "When was your last menstrual period?" ?    na ? ?Protocols used: Blood Pressure - High-A-AH ? ?

## 2021-12-30 NOTE — Telephone Encounter (Addendum)
Summary: [facility] reports pt had elevated blood pressure reading 170/90  ? Mallory with [facility] reports pt had elevated blood pressure 170/90 after taking medication 30 minutes ago. Cb# [phone]   ?  ?Called mallory - no longer with pt - will call wife. ?

## 2021-12-30 NOTE — Telephone Encounter (Addendum)
I called and LVM for pt and Mallory to call back to speak about pt's BP.  ? ?Summary: [facility] reports pt had elevated blood pressure reading 170/90  ? Mallory with [facility] reports pt had elevated blood pressure 170/90 after taking medication 30 minutes ago. Cb# [phone]   ?  ? ?

## 2021-12-30 NOTE — Progress Notes (Signed)
Remote pacemaker transmission.   

## 2021-12-31 NOTE — Telephone Encounter (Signed)
Please have them keep a log of his BP taken at roughly the same time over the next couple of days and ensure he is taking all his antihypertensives. Thanks ?

## 2022-01-04 DIAGNOSIS — R1312 Dysphagia, oropharyngeal phase: Secondary | ICD-10-CM | POA: Diagnosis not present

## 2022-01-04 DIAGNOSIS — G40909 Epilepsy, unspecified, not intractable, without status epilepticus: Secondary | ICD-10-CM | POA: Diagnosis not present

## 2022-01-04 DIAGNOSIS — E782 Mixed hyperlipidemia: Secondary | ICD-10-CM | POA: Diagnosis not present

## 2022-01-04 DIAGNOSIS — D631 Anemia in chronic kidney disease: Secondary | ICD-10-CM | POA: Diagnosis not present

## 2022-01-04 DIAGNOSIS — I700 Atherosclerosis of aorta: Secondary | ICD-10-CM | POA: Diagnosis not present

## 2022-01-04 DIAGNOSIS — I129 Hypertensive chronic kidney disease with stage 1 through stage 4 chronic kidney disease, or unspecified chronic kidney disease: Secondary | ICD-10-CM | POA: Diagnosis not present

## 2022-01-04 DIAGNOSIS — H35033 Hypertensive retinopathy, bilateral: Secondary | ICD-10-CM | POA: Diagnosis not present

## 2022-01-04 DIAGNOSIS — D1803 Hemangioma of intra-abdominal structures: Secondary | ICD-10-CM | POA: Diagnosis not present

## 2022-01-04 DIAGNOSIS — H401134 Primary open-angle glaucoma, bilateral, indeterminate stage: Secondary | ICD-10-CM | POA: Diagnosis not present

## 2022-01-04 DIAGNOSIS — G6181 Chronic inflammatory demyelinating polyneuritis: Secondary | ICD-10-CM | POA: Diagnosis not present

## 2022-01-04 DIAGNOSIS — I69391 Dysphagia following cerebral infarction: Secondary | ICD-10-CM | POA: Diagnosis not present

## 2022-01-04 DIAGNOSIS — N184 Chronic kidney disease, stage 4 (severe): Secondary | ICD-10-CM | POA: Diagnosis not present

## 2022-01-04 DIAGNOSIS — R634 Abnormal weight loss: Secondary | ICD-10-CM | POA: Diagnosis not present

## 2022-01-04 DIAGNOSIS — N281 Cyst of kidney, acquired: Secondary | ICD-10-CM | POA: Diagnosis not present

## 2022-01-04 DIAGNOSIS — K219 Gastro-esophageal reflux disease without esophagitis: Secondary | ICD-10-CM | POA: Diagnosis not present

## 2022-01-04 DIAGNOSIS — I441 Atrioventricular block, second degree: Secondary | ICD-10-CM | POA: Diagnosis not present

## 2022-01-04 DIAGNOSIS — G9341 Metabolic encephalopathy: Secondary | ICD-10-CM | POA: Diagnosis not present

## 2022-01-04 DIAGNOSIS — Z87891 Personal history of nicotine dependence: Secondary | ICD-10-CM | POA: Diagnosis not present

## 2022-01-04 DIAGNOSIS — K5900 Constipation, unspecified: Secondary | ICD-10-CM | POA: Diagnosis not present

## 2022-01-04 DIAGNOSIS — M1990 Unspecified osteoarthritis, unspecified site: Secondary | ICD-10-CM | POA: Diagnosis not present

## 2022-01-04 DIAGNOSIS — Z950 Presence of cardiac pacemaker: Secondary | ICD-10-CM | POA: Diagnosis not present

## 2022-01-04 DIAGNOSIS — G1223 Primary lateral sclerosis: Secondary | ICD-10-CM | POA: Diagnosis not present

## 2022-01-04 DIAGNOSIS — H2513 Age-related nuclear cataract, bilateral: Secondary | ICD-10-CM | POA: Diagnosis not present

## 2022-01-04 DIAGNOSIS — N400 Enlarged prostate without lower urinary tract symptoms: Secondary | ICD-10-CM | POA: Diagnosis not present

## 2022-01-05 DIAGNOSIS — G1223 Primary lateral sclerosis: Secondary | ICD-10-CM | POA: Diagnosis not present

## 2022-01-05 DIAGNOSIS — I129 Hypertensive chronic kidney disease with stage 1 through stage 4 chronic kidney disease, or unspecified chronic kidney disease: Secondary | ICD-10-CM | POA: Diagnosis not present

## 2022-01-05 DIAGNOSIS — Z950 Presence of cardiac pacemaker: Secondary | ICD-10-CM | POA: Diagnosis not present

## 2022-01-05 DIAGNOSIS — N400 Enlarged prostate without lower urinary tract symptoms: Secondary | ICD-10-CM | POA: Diagnosis not present

## 2022-01-05 DIAGNOSIS — K5900 Constipation, unspecified: Secondary | ICD-10-CM | POA: Diagnosis not present

## 2022-01-05 DIAGNOSIS — G40909 Epilepsy, unspecified, not intractable, without status epilepticus: Secondary | ICD-10-CM | POA: Diagnosis not present

## 2022-01-05 DIAGNOSIS — H35033 Hypertensive retinopathy, bilateral: Secondary | ICD-10-CM | POA: Diagnosis not present

## 2022-01-05 DIAGNOSIS — K219 Gastro-esophageal reflux disease without esophagitis: Secondary | ICD-10-CM | POA: Diagnosis not present

## 2022-01-05 DIAGNOSIS — G6181 Chronic inflammatory demyelinating polyneuritis: Secondary | ICD-10-CM | POA: Diagnosis not present

## 2022-01-05 DIAGNOSIS — G9341 Metabolic encephalopathy: Secondary | ICD-10-CM | POA: Diagnosis not present

## 2022-01-05 DIAGNOSIS — N184 Chronic kidney disease, stage 4 (severe): Secondary | ICD-10-CM | POA: Diagnosis not present

## 2022-01-05 DIAGNOSIS — I700 Atherosclerosis of aorta: Secondary | ICD-10-CM | POA: Diagnosis not present

## 2022-01-05 DIAGNOSIS — H2513 Age-related nuclear cataract, bilateral: Secondary | ICD-10-CM | POA: Diagnosis not present

## 2022-01-05 DIAGNOSIS — Z87891 Personal history of nicotine dependence: Secondary | ICD-10-CM | POA: Diagnosis not present

## 2022-01-05 DIAGNOSIS — D631 Anemia in chronic kidney disease: Secondary | ICD-10-CM | POA: Diagnosis not present

## 2022-01-05 DIAGNOSIS — D1803 Hemangioma of intra-abdominal structures: Secondary | ICD-10-CM | POA: Diagnosis not present

## 2022-01-05 DIAGNOSIS — N281 Cyst of kidney, acquired: Secondary | ICD-10-CM | POA: Diagnosis not present

## 2022-01-05 DIAGNOSIS — E782 Mixed hyperlipidemia: Secondary | ICD-10-CM | POA: Diagnosis not present

## 2022-01-05 DIAGNOSIS — R1312 Dysphagia, oropharyngeal phase: Secondary | ICD-10-CM | POA: Diagnosis not present

## 2022-01-05 DIAGNOSIS — M1990 Unspecified osteoarthritis, unspecified site: Secondary | ICD-10-CM | POA: Diagnosis not present

## 2022-01-05 DIAGNOSIS — R634 Abnormal weight loss: Secondary | ICD-10-CM | POA: Diagnosis not present

## 2022-01-05 DIAGNOSIS — H401134 Primary open-angle glaucoma, bilateral, indeterminate stage: Secondary | ICD-10-CM | POA: Diagnosis not present

## 2022-01-05 DIAGNOSIS — I69391 Dysphagia following cerebral infarction: Secondary | ICD-10-CM | POA: Diagnosis not present

## 2022-01-05 DIAGNOSIS — I441 Atrioventricular block, second degree: Secondary | ICD-10-CM | POA: Diagnosis not present

## 2022-01-06 DIAGNOSIS — G6181 Chronic inflammatory demyelinating polyneuritis: Secondary | ICD-10-CM | POA: Diagnosis not present

## 2022-01-06 DIAGNOSIS — R634 Abnormal weight loss: Secondary | ICD-10-CM | POA: Diagnosis not present

## 2022-01-06 DIAGNOSIS — K219 Gastro-esophageal reflux disease without esophagitis: Secondary | ICD-10-CM | POA: Diagnosis not present

## 2022-01-06 DIAGNOSIS — D631 Anemia in chronic kidney disease: Secondary | ICD-10-CM | POA: Diagnosis not present

## 2022-01-06 DIAGNOSIS — H401134 Primary open-angle glaucoma, bilateral, indeterminate stage: Secondary | ICD-10-CM | POA: Diagnosis not present

## 2022-01-06 DIAGNOSIS — G40909 Epilepsy, unspecified, not intractable, without status epilepticus: Secondary | ICD-10-CM | POA: Diagnosis not present

## 2022-01-06 DIAGNOSIS — H2513 Age-related nuclear cataract, bilateral: Secondary | ICD-10-CM | POA: Diagnosis not present

## 2022-01-06 DIAGNOSIS — I441 Atrioventricular block, second degree: Secondary | ICD-10-CM | POA: Diagnosis not present

## 2022-01-06 DIAGNOSIS — E782 Mixed hyperlipidemia: Secondary | ICD-10-CM | POA: Diagnosis not present

## 2022-01-06 DIAGNOSIS — H35033 Hypertensive retinopathy, bilateral: Secondary | ICD-10-CM | POA: Diagnosis not present

## 2022-01-06 DIAGNOSIS — R1312 Dysphagia, oropharyngeal phase: Secondary | ICD-10-CM | POA: Diagnosis not present

## 2022-01-06 DIAGNOSIS — I700 Atherosclerosis of aorta: Secondary | ICD-10-CM | POA: Diagnosis not present

## 2022-01-06 DIAGNOSIS — N400 Enlarged prostate without lower urinary tract symptoms: Secondary | ICD-10-CM | POA: Diagnosis not present

## 2022-01-06 DIAGNOSIS — Z87891 Personal history of nicotine dependence: Secondary | ICD-10-CM | POA: Diagnosis not present

## 2022-01-06 DIAGNOSIS — I129 Hypertensive chronic kidney disease with stage 1 through stage 4 chronic kidney disease, or unspecified chronic kidney disease: Secondary | ICD-10-CM | POA: Diagnosis not present

## 2022-01-06 DIAGNOSIS — D1803 Hemangioma of intra-abdominal structures: Secondary | ICD-10-CM | POA: Diagnosis not present

## 2022-01-06 DIAGNOSIS — I69391 Dysphagia following cerebral infarction: Secondary | ICD-10-CM | POA: Diagnosis not present

## 2022-01-06 DIAGNOSIS — N184 Chronic kidney disease, stage 4 (severe): Secondary | ICD-10-CM | POA: Diagnosis not present

## 2022-01-06 DIAGNOSIS — K5900 Constipation, unspecified: Secondary | ICD-10-CM | POA: Diagnosis not present

## 2022-01-06 DIAGNOSIS — Z950 Presence of cardiac pacemaker: Secondary | ICD-10-CM | POA: Diagnosis not present

## 2022-01-06 DIAGNOSIS — G1223 Primary lateral sclerosis: Secondary | ICD-10-CM | POA: Diagnosis not present

## 2022-01-06 DIAGNOSIS — N281 Cyst of kidney, acquired: Secondary | ICD-10-CM | POA: Diagnosis not present

## 2022-01-06 DIAGNOSIS — G9341 Metabolic encephalopathy: Secondary | ICD-10-CM | POA: Diagnosis not present

## 2022-01-06 DIAGNOSIS — M1990 Unspecified osteoarthritis, unspecified site: Secondary | ICD-10-CM | POA: Diagnosis not present

## 2022-01-06 NOTE — Telephone Encounter (Signed)
Called pt unable to reach left VM to call back if needed ?

## 2022-01-10 DIAGNOSIS — N400 Enlarged prostate without lower urinary tract symptoms: Secondary | ICD-10-CM | POA: Diagnosis not present

## 2022-01-10 DIAGNOSIS — K219 Gastro-esophageal reflux disease without esophagitis: Secondary | ICD-10-CM | POA: Diagnosis not present

## 2022-01-10 DIAGNOSIS — I129 Hypertensive chronic kidney disease with stage 1 through stage 4 chronic kidney disease, or unspecified chronic kidney disease: Secondary | ICD-10-CM | POA: Diagnosis not present

## 2022-01-10 DIAGNOSIS — N184 Chronic kidney disease, stage 4 (severe): Secondary | ICD-10-CM | POA: Diagnosis not present

## 2022-01-10 DIAGNOSIS — I69391 Dysphagia following cerebral infarction: Secondary | ICD-10-CM | POA: Diagnosis not present

## 2022-01-10 DIAGNOSIS — D1803 Hemangioma of intra-abdominal structures: Secondary | ICD-10-CM | POA: Diagnosis not present

## 2022-01-10 DIAGNOSIS — R634 Abnormal weight loss: Secondary | ICD-10-CM | POA: Diagnosis not present

## 2022-01-10 DIAGNOSIS — G1223 Primary lateral sclerosis: Secondary | ICD-10-CM | POA: Diagnosis not present

## 2022-01-10 DIAGNOSIS — G40909 Epilepsy, unspecified, not intractable, without status epilepticus: Secondary | ICD-10-CM | POA: Diagnosis not present

## 2022-01-10 DIAGNOSIS — G9341 Metabolic encephalopathy: Secondary | ICD-10-CM | POA: Diagnosis not present

## 2022-01-10 DIAGNOSIS — E782 Mixed hyperlipidemia: Secondary | ICD-10-CM | POA: Diagnosis not present

## 2022-01-10 DIAGNOSIS — N281 Cyst of kidney, acquired: Secondary | ICD-10-CM | POA: Diagnosis not present

## 2022-01-10 DIAGNOSIS — Z950 Presence of cardiac pacemaker: Secondary | ICD-10-CM | POA: Diagnosis not present

## 2022-01-10 DIAGNOSIS — I700 Atherosclerosis of aorta: Secondary | ICD-10-CM | POA: Diagnosis not present

## 2022-01-10 DIAGNOSIS — R1312 Dysphagia, oropharyngeal phase: Secondary | ICD-10-CM | POA: Diagnosis not present

## 2022-01-10 DIAGNOSIS — H35033 Hypertensive retinopathy, bilateral: Secondary | ICD-10-CM | POA: Diagnosis not present

## 2022-01-10 DIAGNOSIS — D631 Anemia in chronic kidney disease: Secondary | ICD-10-CM | POA: Diagnosis not present

## 2022-01-10 DIAGNOSIS — M1990 Unspecified osteoarthritis, unspecified site: Secondary | ICD-10-CM | POA: Diagnosis not present

## 2022-01-10 DIAGNOSIS — H401134 Primary open-angle glaucoma, bilateral, indeterminate stage: Secondary | ICD-10-CM | POA: Diagnosis not present

## 2022-01-10 DIAGNOSIS — K5900 Constipation, unspecified: Secondary | ICD-10-CM | POA: Diagnosis not present

## 2022-01-10 DIAGNOSIS — I441 Atrioventricular block, second degree: Secondary | ICD-10-CM | POA: Diagnosis not present

## 2022-01-10 DIAGNOSIS — G1220 Motor neuron disease, unspecified: Secondary | ICD-10-CM | POA: Diagnosis not present

## 2022-01-10 DIAGNOSIS — H2513 Age-related nuclear cataract, bilateral: Secondary | ICD-10-CM | POA: Diagnosis not present

## 2022-01-10 DIAGNOSIS — Z87891 Personal history of nicotine dependence: Secondary | ICD-10-CM | POA: Diagnosis not present

## 2022-01-10 DIAGNOSIS — G6181 Chronic inflammatory demyelinating polyneuritis: Secondary | ICD-10-CM | POA: Diagnosis not present

## 2022-01-11 ENCOUNTER — Telehealth: Payer: Self-pay | Admitting: Family Medicine

## 2022-01-11 DIAGNOSIS — R634 Abnormal weight loss: Secondary | ICD-10-CM | POA: Diagnosis not present

## 2022-01-11 DIAGNOSIS — Z87891 Personal history of nicotine dependence: Secondary | ICD-10-CM | POA: Diagnosis not present

## 2022-01-11 DIAGNOSIS — H35033 Hypertensive retinopathy, bilateral: Secondary | ICD-10-CM | POA: Diagnosis not present

## 2022-01-11 DIAGNOSIS — E782 Mixed hyperlipidemia: Secondary | ICD-10-CM | POA: Diagnosis not present

## 2022-01-11 DIAGNOSIS — R1312 Dysphagia, oropharyngeal phase: Secondary | ICD-10-CM | POA: Diagnosis not present

## 2022-01-11 DIAGNOSIS — Z950 Presence of cardiac pacemaker: Secondary | ICD-10-CM | POA: Diagnosis not present

## 2022-01-11 DIAGNOSIS — K5900 Constipation, unspecified: Secondary | ICD-10-CM | POA: Diagnosis not present

## 2022-01-11 DIAGNOSIS — D631 Anemia in chronic kidney disease: Secondary | ICD-10-CM | POA: Diagnosis not present

## 2022-01-11 DIAGNOSIS — N184 Chronic kidney disease, stage 4 (severe): Secondary | ICD-10-CM | POA: Diagnosis not present

## 2022-01-11 DIAGNOSIS — I69391 Dysphagia following cerebral infarction: Secondary | ICD-10-CM | POA: Diagnosis not present

## 2022-01-11 DIAGNOSIS — D1803 Hemangioma of intra-abdominal structures: Secondary | ICD-10-CM | POA: Diagnosis not present

## 2022-01-11 DIAGNOSIS — H401134 Primary open-angle glaucoma, bilateral, indeterminate stage: Secondary | ICD-10-CM | POA: Diagnosis not present

## 2022-01-11 DIAGNOSIS — N400 Enlarged prostate without lower urinary tract symptoms: Secondary | ICD-10-CM | POA: Diagnosis not present

## 2022-01-11 DIAGNOSIS — I129 Hypertensive chronic kidney disease with stage 1 through stage 4 chronic kidney disease, or unspecified chronic kidney disease: Secondary | ICD-10-CM | POA: Diagnosis not present

## 2022-01-11 DIAGNOSIS — G40909 Epilepsy, unspecified, not intractable, without status epilepticus: Secondary | ICD-10-CM | POA: Diagnosis not present

## 2022-01-11 DIAGNOSIS — N281 Cyst of kidney, acquired: Secondary | ICD-10-CM | POA: Diagnosis not present

## 2022-01-11 DIAGNOSIS — M1990 Unspecified osteoarthritis, unspecified site: Secondary | ICD-10-CM | POA: Diagnosis not present

## 2022-01-11 DIAGNOSIS — G9341 Metabolic encephalopathy: Secondary | ICD-10-CM | POA: Diagnosis not present

## 2022-01-11 DIAGNOSIS — I700 Atherosclerosis of aorta: Secondary | ICD-10-CM | POA: Diagnosis not present

## 2022-01-11 DIAGNOSIS — G6181 Chronic inflammatory demyelinating polyneuritis: Secondary | ICD-10-CM | POA: Diagnosis not present

## 2022-01-11 DIAGNOSIS — G1223 Primary lateral sclerosis: Secondary | ICD-10-CM | POA: Diagnosis not present

## 2022-01-11 DIAGNOSIS — H2513 Age-related nuclear cataract, bilateral: Secondary | ICD-10-CM | POA: Diagnosis not present

## 2022-01-11 DIAGNOSIS — I441 Atrioventricular block, second degree: Secondary | ICD-10-CM | POA: Diagnosis not present

## 2022-01-11 DIAGNOSIS — K219 Gastro-esophageal reflux disease without esophagitis: Secondary | ICD-10-CM | POA: Diagnosis not present

## 2022-01-11 NOTE — Telephone Encounter (Signed)
Copied from [place] [id number]. Topic: Quick Communication - Home Health Verbal Orders ?>> Jan 11, 2022  2:48 PM Loma [place] wrote: ?Caller/Agency: Mallory at [facility] ?Callback Number: [phone] ?Requesting OT extend to 1 time a week for 4 wks ?

## 2022-01-12 NOTE — Telephone Encounter (Signed)
Darrell [name] called requesting an update on request from yesterday, please advise  ?

## 2022-01-12 NOTE — Telephone Encounter (Signed)
Mallory was called and given verbal orders for patient. ?

## 2022-01-13 DIAGNOSIS — D631 Anemia in chronic kidney disease: Secondary | ICD-10-CM | POA: Diagnosis not present

## 2022-01-13 DIAGNOSIS — Z87891 Personal history of nicotine dependence: Secondary | ICD-10-CM | POA: Diagnosis not present

## 2022-01-13 DIAGNOSIS — I700 Atherosclerosis of aorta: Secondary | ICD-10-CM | POA: Diagnosis not present

## 2022-01-13 DIAGNOSIS — R1312 Dysphagia, oropharyngeal phase: Secondary | ICD-10-CM | POA: Diagnosis not present

## 2022-01-13 DIAGNOSIS — H401134 Primary open-angle glaucoma, bilateral, indeterminate stage: Secondary | ICD-10-CM | POA: Diagnosis not present

## 2022-01-13 DIAGNOSIS — D1803 Hemangioma of intra-abdominal structures: Secondary | ICD-10-CM | POA: Diagnosis not present

## 2022-01-13 DIAGNOSIS — N184 Chronic kidney disease, stage 4 (severe): Secondary | ICD-10-CM | POA: Diagnosis not present

## 2022-01-13 DIAGNOSIS — N400 Enlarged prostate without lower urinary tract symptoms: Secondary | ICD-10-CM | POA: Diagnosis not present

## 2022-01-13 DIAGNOSIS — M1990 Unspecified osteoarthritis, unspecified site: Secondary | ICD-10-CM | POA: Diagnosis not present

## 2022-01-13 DIAGNOSIS — I441 Atrioventricular block, second degree: Secondary | ICD-10-CM | POA: Diagnosis not present

## 2022-01-13 DIAGNOSIS — K5900 Constipation, unspecified: Secondary | ICD-10-CM | POA: Diagnosis not present

## 2022-01-13 DIAGNOSIS — G9341 Metabolic encephalopathy: Secondary | ICD-10-CM | POA: Diagnosis not present

## 2022-01-13 DIAGNOSIS — G1223 Primary lateral sclerosis: Secondary | ICD-10-CM | POA: Diagnosis not present

## 2022-01-13 DIAGNOSIS — G6181 Chronic inflammatory demyelinating polyneuritis: Secondary | ICD-10-CM | POA: Diagnosis not present

## 2022-01-13 DIAGNOSIS — R634 Abnormal weight loss: Secondary | ICD-10-CM | POA: Diagnosis not present

## 2022-01-13 DIAGNOSIS — I69391 Dysphagia following cerebral infarction: Secondary | ICD-10-CM | POA: Diagnosis not present

## 2022-01-13 DIAGNOSIS — H35033 Hypertensive retinopathy, bilateral: Secondary | ICD-10-CM | POA: Diagnosis not present

## 2022-01-13 DIAGNOSIS — I129 Hypertensive chronic kidney disease with stage 1 through stage 4 chronic kidney disease, or unspecified chronic kidney disease: Secondary | ICD-10-CM | POA: Diagnosis not present

## 2022-01-13 DIAGNOSIS — N281 Cyst of kidney, acquired: Secondary | ICD-10-CM | POA: Diagnosis not present

## 2022-01-13 DIAGNOSIS — E782 Mixed hyperlipidemia: Secondary | ICD-10-CM | POA: Diagnosis not present

## 2022-01-13 DIAGNOSIS — K219 Gastro-esophageal reflux disease without esophagitis: Secondary | ICD-10-CM | POA: Diagnosis not present

## 2022-01-13 DIAGNOSIS — G40909 Epilepsy, unspecified, not intractable, without status epilepticus: Secondary | ICD-10-CM | POA: Diagnosis not present

## 2022-01-13 DIAGNOSIS — Z950 Presence of cardiac pacemaker: Secondary | ICD-10-CM | POA: Diagnosis not present

## 2022-01-13 DIAGNOSIS — H2513 Age-related nuclear cataract, bilateral: Secondary | ICD-10-CM | POA: Diagnosis not present

## 2022-01-17 ENCOUNTER — Ambulatory Visit: Payer: Medicare HMO | Admitting: Podiatry

## 2022-01-18 DIAGNOSIS — D631 Anemia in chronic kidney disease: Secondary | ICD-10-CM | POA: Diagnosis not present

## 2022-01-18 DIAGNOSIS — E782 Mixed hyperlipidemia: Secondary | ICD-10-CM | POA: Diagnosis not present

## 2022-01-18 DIAGNOSIS — I700 Atherosclerosis of aorta: Secondary | ICD-10-CM | POA: Diagnosis not present

## 2022-01-18 DIAGNOSIS — N400 Enlarged prostate without lower urinary tract symptoms: Secondary | ICD-10-CM | POA: Diagnosis not present

## 2022-01-18 DIAGNOSIS — K5900 Constipation, unspecified: Secondary | ICD-10-CM | POA: Diagnosis not present

## 2022-01-18 DIAGNOSIS — N184 Chronic kidney disease, stage 4 (severe): Secondary | ICD-10-CM | POA: Diagnosis not present

## 2022-01-18 DIAGNOSIS — R1312 Dysphagia, oropharyngeal phase: Secondary | ICD-10-CM | POA: Diagnosis not present

## 2022-01-18 DIAGNOSIS — N281 Cyst of kidney, acquired: Secondary | ICD-10-CM | POA: Diagnosis not present

## 2022-01-18 DIAGNOSIS — M1990 Unspecified osteoarthritis, unspecified site: Secondary | ICD-10-CM | POA: Diagnosis not present

## 2022-01-18 DIAGNOSIS — Z87891 Personal history of nicotine dependence: Secondary | ICD-10-CM | POA: Diagnosis not present

## 2022-01-18 DIAGNOSIS — I441 Atrioventricular block, second degree: Secondary | ICD-10-CM | POA: Diagnosis not present

## 2022-01-18 DIAGNOSIS — H401134 Primary open-angle glaucoma, bilateral, indeterminate stage: Secondary | ICD-10-CM | POA: Diagnosis not present

## 2022-01-18 DIAGNOSIS — G1223 Primary lateral sclerosis: Secondary | ICD-10-CM | POA: Diagnosis not present

## 2022-01-18 DIAGNOSIS — H35033 Hypertensive retinopathy, bilateral: Secondary | ICD-10-CM | POA: Diagnosis not present

## 2022-01-18 DIAGNOSIS — G6181 Chronic inflammatory demyelinating polyneuritis: Secondary | ICD-10-CM | POA: Diagnosis not present

## 2022-01-18 DIAGNOSIS — G40909 Epilepsy, unspecified, not intractable, without status epilepticus: Secondary | ICD-10-CM | POA: Diagnosis not present

## 2022-01-18 DIAGNOSIS — I69391 Dysphagia following cerebral infarction: Secondary | ICD-10-CM | POA: Diagnosis not present

## 2022-01-18 DIAGNOSIS — R634 Abnormal weight loss: Secondary | ICD-10-CM | POA: Diagnosis not present

## 2022-01-18 DIAGNOSIS — D1803 Hemangioma of intra-abdominal structures: Secondary | ICD-10-CM | POA: Diagnosis not present

## 2022-01-18 DIAGNOSIS — H2513 Age-related nuclear cataract, bilateral: Secondary | ICD-10-CM | POA: Diagnosis not present

## 2022-01-18 DIAGNOSIS — K219 Gastro-esophageal reflux disease without esophagitis: Secondary | ICD-10-CM | POA: Diagnosis not present

## 2022-01-18 DIAGNOSIS — I129 Hypertensive chronic kidney disease with stage 1 through stage 4 chronic kidney disease, or unspecified chronic kidney disease: Secondary | ICD-10-CM | POA: Diagnosis not present

## 2022-01-18 DIAGNOSIS — Z950 Presence of cardiac pacemaker: Secondary | ICD-10-CM | POA: Diagnosis not present

## 2022-01-18 DIAGNOSIS — G9341 Metabolic encephalopathy: Secondary | ICD-10-CM | POA: Diagnosis not present

## 2022-01-20 DIAGNOSIS — G6181 Chronic inflammatory demyelinating polyneuritis: Secondary | ICD-10-CM | POA: Diagnosis not present

## 2022-01-20 DIAGNOSIS — I69391 Dysphagia following cerebral infarction: Secondary | ICD-10-CM | POA: Diagnosis not present

## 2022-01-20 DIAGNOSIS — H35033 Hypertensive retinopathy, bilateral: Secondary | ICD-10-CM | POA: Diagnosis not present

## 2022-01-20 DIAGNOSIS — N281 Cyst of kidney, acquired: Secondary | ICD-10-CM | POA: Diagnosis not present

## 2022-01-20 DIAGNOSIS — G9341 Metabolic encephalopathy: Secondary | ICD-10-CM | POA: Diagnosis not present

## 2022-01-20 DIAGNOSIS — D1803 Hemangioma of intra-abdominal structures: Secondary | ICD-10-CM | POA: Diagnosis not present

## 2022-01-20 DIAGNOSIS — R634 Abnormal weight loss: Secondary | ICD-10-CM | POA: Diagnosis not present

## 2022-01-20 DIAGNOSIS — K5900 Constipation, unspecified: Secondary | ICD-10-CM | POA: Diagnosis not present

## 2022-01-20 DIAGNOSIS — I129 Hypertensive chronic kidney disease with stage 1 through stage 4 chronic kidney disease, or unspecified chronic kidney disease: Secondary | ICD-10-CM | POA: Diagnosis not present

## 2022-01-20 DIAGNOSIS — H401134 Primary open-angle glaucoma, bilateral, indeterminate stage: Secondary | ICD-10-CM | POA: Diagnosis not present

## 2022-01-20 DIAGNOSIS — G40909 Epilepsy, unspecified, not intractable, without status epilepticus: Secondary | ICD-10-CM | POA: Diagnosis not present

## 2022-01-20 DIAGNOSIS — E782 Mixed hyperlipidemia: Secondary | ICD-10-CM | POA: Diagnosis not present

## 2022-01-20 DIAGNOSIS — N400 Enlarged prostate without lower urinary tract symptoms: Secondary | ICD-10-CM | POA: Diagnosis not present

## 2022-01-20 DIAGNOSIS — I700 Atherosclerosis of aorta: Secondary | ICD-10-CM | POA: Diagnosis not present

## 2022-01-20 DIAGNOSIS — R1312 Dysphagia, oropharyngeal phase: Secondary | ICD-10-CM | POA: Diagnosis not present

## 2022-01-20 DIAGNOSIS — K219 Gastro-esophageal reflux disease without esophagitis: Secondary | ICD-10-CM | POA: Diagnosis not present

## 2022-01-20 DIAGNOSIS — Z950 Presence of cardiac pacemaker: Secondary | ICD-10-CM | POA: Diagnosis not present

## 2022-01-20 DIAGNOSIS — Z87891 Personal history of nicotine dependence: Secondary | ICD-10-CM | POA: Diagnosis not present

## 2022-01-20 DIAGNOSIS — I441 Atrioventricular block, second degree: Secondary | ICD-10-CM | POA: Diagnosis not present

## 2022-01-20 DIAGNOSIS — D631 Anemia in chronic kidney disease: Secondary | ICD-10-CM | POA: Diagnosis not present

## 2022-01-20 DIAGNOSIS — G1223 Primary lateral sclerosis: Secondary | ICD-10-CM | POA: Diagnosis not present

## 2022-01-20 DIAGNOSIS — M1990 Unspecified osteoarthritis, unspecified site: Secondary | ICD-10-CM | POA: Diagnosis not present

## 2022-01-20 DIAGNOSIS — H2513 Age-related nuclear cataract, bilateral: Secondary | ICD-10-CM | POA: Diagnosis not present

## 2022-01-20 DIAGNOSIS — N184 Chronic kidney disease, stage 4 (severe): Secondary | ICD-10-CM | POA: Diagnosis not present

## 2022-01-22 DIAGNOSIS — G1221 Amyotrophic lateral sclerosis: Secondary | ICD-10-CM | POA: Diagnosis not present

## 2022-01-23 DIAGNOSIS — I441 Atrioventricular block, second degree: Secondary | ICD-10-CM | POA: Diagnosis not present

## 2022-01-23 DIAGNOSIS — N184 Chronic kidney disease, stage 4 (severe): Secondary | ICD-10-CM | POA: Diagnosis not present

## 2022-01-23 DIAGNOSIS — N281 Cyst of kidney, acquired: Secondary | ICD-10-CM | POA: Diagnosis not present

## 2022-01-23 DIAGNOSIS — I129 Hypertensive chronic kidney disease with stage 1 through stage 4 chronic kidney disease, or unspecified chronic kidney disease: Secondary | ICD-10-CM | POA: Diagnosis not present

## 2022-01-23 DIAGNOSIS — N400 Enlarged prostate without lower urinary tract symptoms: Secondary | ICD-10-CM | POA: Diagnosis not present

## 2022-01-23 DIAGNOSIS — G1223 Primary lateral sclerosis: Secondary | ICD-10-CM | POA: Diagnosis not present

## 2022-01-23 DIAGNOSIS — K219 Gastro-esophageal reflux disease without esophagitis: Secondary | ICD-10-CM | POA: Diagnosis not present

## 2022-01-23 DIAGNOSIS — I700 Atherosclerosis of aorta: Secondary | ICD-10-CM | POA: Diagnosis not present

## 2022-01-23 DIAGNOSIS — K5900 Constipation, unspecified: Secondary | ICD-10-CM | POA: Diagnosis not present

## 2022-01-23 DIAGNOSIS — R634 Abnormal weight loss: Secondary | ICD-10-CM | POA: Diagnosis not present

## 2022-01-23 DIAGNOSIS — D1803 Hemangioma of intra-abdominal structures: Secondary | ICD-10-CM | POA: Diagnosis not present

## 2022-01-23 DIAGNOSIS — Z950 Presence of cardiac pacemaker: Secondary | ICD-10-CM | POA: Diagnosis not present

## 2022-01-23 DIAGNOSIS — I69391 Dysphagia following cerebral infarction: Secondary | ICD-10-CM | POA: Diagnosis not present

## 2022-01-23 DIAGNOSIS — Z87891 Personal history of nicotine dependence: Secondary | ICD-10-CM | POA: Diagnosis not present

## 2022-01-23 DIAGNOSIS — H401134 Primary open-angle glaucoma, bilateral, indeterminate stage: Secondary | ICD-10-CM | POA: Diagnosis not present

## 2022-01-23 DIAGNOSIS — G9341 Metabolic encephalopathy: Secondary | ICD-10-CM | POA: Diagnosis not present

## 2022-01-23 DIAGNOSIS — D631 Anemia in chronic kidney disease: Secondary | ICD-10-CM | POA: Diagnosis not present

## 2022-01-23 DIAGNOSIS — H2513 Age-related nuclear cataract, bilateral: Secondary | ICD-10-CM | POA: Diagnosis not present

## 2022-01-23 DIAGNOSIS — G40909 Epilepsy, unspecified, not intractable, without status epilepticus: Secondary | ICD-10-CM | POA: Diagnosis not present

## 2022-01-23 DIAGNOSIS — G6181 Chronic inflammatory demyelinating polyneuritis: Secondary | ICD-10-CM | POA: Diagnosis not present

## 2022-01-23 DIAGNOSIS — M1990 Unspecified osteoarthritis, unspecified site: Secondary | ICD-10-CM | POA: Diagnosis not present

## 2022-01-23 DIAGNOSIS — E782 Mixed hyperlipidemia: Secondary | ICD-10-CM | POA: Diagnosis not present

## 2022-01-23 DIAGNOSIS — H35033 Hypertensive retinopathy, bilateral: Secondary | ICD-10-CM | POA: Diagnosis not present

## 2022-01-23 DIAGNOSIS — R1312 Dysphagia, oropharyngeal phase: Secondary | ICD-10-CM | POA: Diagnosis not present

## 2022-01-24 ENCOUNTER — Encounter: Payer: Self-pay | Admitting: Family Medicine

## 2022-01-24 ENCOUNTER — Ambulatory Visit: Payer: Medicare HMO | Attending: Family Medicine | Admitting: Family Medicine

## 2022-01-24 VITALS — BP 159/86 | HR 72 | Ht 75.0 in

## 2022-01-24 DIAGNOSIS — I10 Essential (primary) hypertension: Secondary | ICD-10-CM | POA: Diagnosis not present

## 2022-01-24 DIAGNOSIS — J3489 Other specified disorders of nose and nasal sinuses: Secondary | ICD-10-CM | POA: Diagnosis not present

## 2022-01-24 DIAGNOSIS — I69354 Hemiplegia and hemiparesis following cerebral infarction affecting left non-dominant side: Secondary | ICD-10-CM

## 2022-01-24 DIAGNOSIS — R197 Diarrhea, unspecified: Secondary | ICD-10-CM

## 2022-01-24 DIAGNOSIS — G1223 Primary lateral sclerosis: Secondary | ICD-10-CM | POA: Diagnosis not present

## 2022-01-24 DIAGNOSIS — N184 Chronic kidney disease, stage 4 (severe): Secondary | ICD-10-CM

## 2022-01-24 DIAGNOSIS — N2889 Other specified disorders of kidney and ureter: Secondary | ICD-10-CM | POA: Diagnosis not present

## 2022-01-24 DIAGNOSIS — Z23 Encounter for immunization: Secondary | ICD-10-CM | POA: Diagnosis not present

## 2022-01-24 MED ORDER — HYDRALAZINE HCL 10 MG PO TABS: 10.0000 mg | ORAL_TABLET | Freq: Three times a day (TID) | ORAL | 1 refills | Status: DC

## 2022-01-24 MED ORDER — CETIRIZINE HCL 10 MG PO TABS: 10.0000 mg | ORAL_TABLET | Freq: Every day | ORAL | 1 refills | Status: DC

## 2022-01-24 MED ORDER — DICYCLOMINE HCL 10 MG PO CAPS: 10.0000 mg | ORAL_CAPSULE | Freq: Every day | ORAL | 1 refills | Status: DC | PRN

## 2022-01-24 MED ORDER — TAMSULOSIN HCL 0.4 MG PO CAPS: 0.4000 mg | ORAL_CAPSULE | Freq: Every day | ORAL | 1 refills | Status: DC

## 2022-01-24 NOTE — Patient Instructions (Signed)
Pneumococcal Conjugate Vaccine (Prevnar 20) Suspension for Injection What is this medication? PNEUMOCOCCAL VACCINE (NEU mo KOK al vak SEEN) is a vaccine. It prevents pneumococcus bacterial infections. These bacteria can cause serious infections like pneumonia, meningitis, and blood infections. This vaccine will not treat an infection and will not cause infection. This vaccine is recommended for adults 18 years and older. This medicine may be used for other purposes; ask your health care provider or pharmacist if you have questions. COMMON BRAND NAME(S): Prevnar 20 What should I tell my care team before I take this medication? They need to know if you have any of these conditions: bleeding disorder fever immune system problems an unusual or allergic reaction to pneumococcal vaccine, diphtheria toxoid, other vaccines, other medicines, foods, dyes, or preservatives pregnant or trying to get pregnant breast-feeding How should I use this medication? This vaccine is injected into a muscle. It is given by a health care provider. A copy of Vaccine Information Statements will be given before each vaccination. Be sure to read this information carefully each time. This sheet may change often. Talk to your health care provider about the use of this medicine in children. Special care may be needed. Overdosage: If you think you have taken too much of this medicine contact a poison control center or emergency room at once. NOTE: This medicine is only for you. Do not share this medicine with others. What if I miss a dose? This does not apply. This medicine is not for regular use. What may interact with this medication? medicines for cancer chemotherapy medicines that suppress your immune function steroid medicines like prednisone or cortisone This list may not describe all possible interactions. Give your health care provider a list of all the medicines, herbs, non-prescription drugs, or dietary supplements  you use. Also tell them if you smoke, drink alcohol, or use illegal drugs. Some items may interact with your medicine. What should I watch for while using this medication? Mild fever and pain should go away in 3 days or less. Report any unusual symptoms to your health care provider. What side effects may I notice from receiving this medication? Side effects that you should report to your doctor or health care professional as soon as possible: allergic reactions (skin rash, itching or hives; swelling of the face, lips, or tongue) confusion fast, irregular heartbeat fever over 102 degrees F muscle weakness seizures trouble breathing unusual bruising or bleeding Side effects that usually do not require medical attention (report to your doctor or health care professional if they continue or are bothersome): fever of 102 degrees F or less headache joint pain muscle cramps, pain pain, tender at site where injected This list may not describe all possible side effects. Call your doctor for medical advice about side effects. You may report side effects to FDA at 1-800-FDA-1088. Where should I keep my medication? This vaccine is only given by a health care provider. It will not be stored at home. NOTE: This sheet is a summary. It may not cover all possible information. If you have questions about this medicine, talk to your doctor, pharmacist, or health care provider.  2023 Elsevier/Gold Standard (2020-05-27 00:00:00)  

## 2022-01-24 NOTE — Progress Notes (Signed)
? ?Subjective:  ?Patient ID: Darrell [name], male    DOB: July 24, 1951  Age: 71 y.o. MRN: [id number] ? ?CC: Hypertension ? ? ?HPI ?Darrell [name] is a 71 y.o. year old male with a history of hypertension, previous CVA with residual left-sided weakness, seizure in the setting of prior CVA, second degree AV block (status post medtronic dual chamber pacemaker) stage III CKD, motor neuron disease/ALS. ?Accompanied by spouse who provides most of the history. ? ?Interval History: ? ?BPs at home have been in the 813-887 systolic but he endorses adherence with his antihypertensive. ?Cough has improved and he has noticed a decrease in his secretions, he is not having to use his suction.  Wife states his eating has improved as he is able to chew better and he has gained some weight. ?He has not had a recent visit with Dr. Tillman Abide of the [facility] at [place]. ?He last saw his neurologist Dr. Leta Baptist 2 months ago with recommendations to follow-up in 6 months. ? ?He is currently undergoing home PT and is doing better with regards to his range of motion but he is still wheelchair-bound. ?Nephrology assessed with [facility]. ?He was evaluated by urology due to a right renal mass concerning for malignancy and per urology notes due to comorbid conditions, he would not be a candidate for surgical management. ?He does have intermittent loose stools which the wife states is not diarrhea.  He was previously on dicyclomine but I have reviewed his medications today and he does not have that with him. ?Denies additional concerns today. ?Past Medical History:  ?Diagnosis Date  ? Arthritis   ? At high risk for falls 08/16/2015  ? Bradycardia   ? Chronic kidney disease   ? stage 3 GFR 30-59 ml/min   ? CIDP (chronic inflammatory demyelinating polyneuropathy) ([name])   ? CKD (chronic kidney disease) stage 3, GFR 30-59 ml/min ([name]) 08/16/2015  ? Dysphagia as late effect of cerebrovascular disease   ? pts wife states pt has  to eat soft foods   ? Elevated liver enzymes 08/10/2016  ? GERD (gastroesophageal reflux disease)   ? Glaucoma   ? High cholesterol   ? History of CVA with residual deficit 03/25/2013  ? Hypertension   ? Hypertensive retinopathy of both eyes 01/16/2017  ? Inguinal hernia 03/25/2013  ? Liver hemangioma 08/14/2016  ? Neuromuscular disorder ([place])   ? chronic inflammatory demyelinating polyneuropathy   ? New onset seizure ([place]) 07/08/2017  ? seizure 07/14/18  ? Nuclear sclerosis of both eyes 10/25/2016  ? Presence of permanent cardiac pacemaker   ? placed in april 2018  ? Primary open angle glaucoma of both eyes, indeterminate stage 10/25/2016  ? Renal mass, right 08/14/2016  ? Status cardiac pacemaker 01/29/2017  ? Placed for second degree heart block on 01/16/17 Medtronic Azure XT DR MRI SureScan dual-chamber pacemaker  ? Stroke [facility])   ? 2011 with residual deficit left sided weakness  ? Tobacco dependence   ? ? ?Past Surgical History:  ?Procedure Laterality Date  ? EYE SURGERY    ? HERNIA REPAIR    ? INGUINAL HERNIA REPAIR Right 11/23/2014  ? Procedure: right inguinal hernia repair with mesh;  Surgeon: Armandina Gemma, MD;  Location: [facility];  Service: General;  Laterality: Right;  ? INSERTION OF MESH N/A 11/23/2014  ? Procedure: INSERTION OF MESH;  Surgeon: Armandina Gemma, MD;  Location: [facility];  Service: General;  Laterality: N/A;  ? MASS EXCISION Left 08/29/2017  ?  Procedure: EXCISION OF LEFT NECK MASS;  Surgeon: Coralie Keens, MD;  Location: [facility];  Service: General;  Laterality: Left;  ? PACEMAKER IMPLANT N/A 01/16/2017  ? Procedure: Pacemaker Implant;  Surgeon: Will Meredith Leeds, MD;  Location: [facility];  Service: Cardiovascular;  Laterality: N/A;  ? SHOULDER SURGERY Bilateral 1988, 1998  ? ? ?Family History  ?Problem Relation Age of Onset  ? Hypertension Mother   ? Diabetes Sister   ? Hypertension Sister   ? Cancer Sister   ?     1 sister  ? COPD Sister   ?     in 1 sister  ? Stomach cancer Neg Hx   ? Colon cancer  Neg Hx   ? Pancreatic cancer Neg Hx   ? Esophageal cancer Neg Hx   ? ? ?Social History  ? ?Socioeconomic History  ? Marital status: Married  ?  Spouse name: [name]  ? Number of children: 1  ? Years of education: college  ? Highest education level: Not on file  ?Occupational History  ? Occupation: retired  ?Tobacco Use  ? Smoking status: Every Day  ?  Packs/day: 1.00  ?  Years: 40.00  ?  Pack years: 40.00  ?  Types: Cigarettes  ? Smokeless tobacco: Never  ? Tobacco comments:  ?  12/13/20 5 a day  ?Vaping Use  ? Vaping Use: Never used  ?Substance and Sexual Activity  ? Alcohol use: No  ?  Comment: alcohol free for 1 year was drinking 1/2 pint per day   ? Drug use: No  ? Sexual activity: Not on file  ?Other Topics Concern  ? Not on file  ?Social History Narrative  ? 09/13/17 Patient lives at home with spouse.  ? Has 23 yo daughter  ? No grandchildren  ? ?Social Determinants of Health  ? ?Financial Resource Strain: Not on file  ?Food Insecurity: Not on file  ?Transportation Needs: Not on file  ?Physical Activity: Not on file  ?Stress: Not on file  ?Social Connections: Not on file  ? ? ?Allergies  ?Allergen Reactions  ? Ace Inhibitors Other (See Comments)  ?  Hyperkalemia  ? Doxycycline Other (See Comments)  ?  Hiccups, cough, nausea and emesis, elevated liver enzymes, elevated eosinophils, SOB concerning for early DRESS syndrome   ? Atacand Hct [Candesartan Cilexetil-Hctz] Hives  ? Shellfish Allergy Hives  ? ? ?Outpatient Medications Prior to Visit  ?Medication Sig Dispense Refill  ? albuterol (VENTOLIN HFA) 108 (90 Base) MCG/ACT inhaler Inhale 2 puffs into the lungs every 6 (six) hours as needed for up to 30 days for Wheezing. (Patient taking differently: Inhale 2 puffs into the lungs as needed for wheezing or shortness of breath.) 18 g 5  ? amLODipine (NORVASC) 10 MG tablet Take 1 tablet (10 mg total) by mouth daily. 90 tablet 1  ? atorvastatin (LIPITOR) 40 MG tablet Take 1 tablet (40 mg total) by mouth daily. 90  tablet 1  ? azithromycin (ZITHROMAX) 500 MG tablet Take 500 mg by mouth daily. Take 1 tablet by mouth Monday,Wednesday and Friday    ? brimonidine (ALPHAGAN) 0.2 % ophthalmic solution Place 1 drop into both eyes 3 times daily.    ? Budeson-Glycopyrrol-Formoterol (BREZTRI AEROSPHERE) 160-9-4.8 MCG/ACT AERO Inhale 2 puffs into the lungs 2 (two) times daily. (Patient taking differently: Inhale 2 puffs into the lungs as needed (SOB, wheezing).) 10.7 g 6  ? dorzolamide-timolol (COSOPT) 22.3-6.8 MG/ML ophthalmic solution Place 1 drop into both  eyes daily.     ? ferrous sulfate (FEROSUL) 325 (65 FE) MG tablet Take 1 tablet (325 mg total) by mouth 2 (two) times daily with a meal. 60 tablet 2  ? finasteride (PROSCAR) 5 MG tablet Take 5 mg by mouth daily.    ? fluticasone (FLONASE) 50 MCG/ACT nasal spray Place 2 sprays into both nostrils daily. 16 g 3  ? glycopyrrolate (ROBINUL) 1 MG tablet Take 1 tablet (1 mg total) by mouth 2 times daily. 60 tablet 5  ? guaiFENesin-dextromethorphan (ROBITUSSIN DM) 100-10 MG/5ML syrup Take 5 mLs by mouth every 4 (four) hours as needed for cough. 236 mL 0  ? latanoprost (XALATAN) 0.005 % ophthalmic solution Place 1 drop into both eyes nightly.    ? levETIRAcetam (KEPPRA) 500 MG tablet Take 1 tablet (500 mg total) by mouth 2 (two) times daily. 180 tablet 4  ? lidocaine (LIDODERM) 5 % Apply patch to painful area. Patch may remain in place for up to 12 hours in a 24 hour period. 30 patch 0  ? Misc. Devices MISC Yankauer suction. Dx: ALS 1 each 0  ? Misc. Devices MISC Coughalator 1 each 0  ? Misc. Devices MISC Nebulizer machine.  Diagnosis- Motor neuron disease 1 each 0  ? Misc. Devices [facility] Bed ?DXg12.20 1 each 0  ? Misc. [facility] bed extension. Diagnosis: ALS. Height 6'4" 1 each 0  ? Misc. Devices MISC Manual wheelchair.  Diagnosis-ALS 1 each 0  ? Misc. Devices MISC Right and left wrist splint 1 each 0  ? montelukast (SINGULAIR) 10 MG tablet Take 10 mg by mouth  at bedtime.    ? Multiple Vitamin (MULTI-VITAMIN) tablet Take by mouth.    ? pantoprazole (PROTONIX) 40 MG tablet Take 1 tablet (40 mg total) by mouth 2 (two) times daily. 60 tablet 0  ? polyethylene glycol

## 2022-01-25 DIAGNOSIS — Z950 Presence of cardiac pacemaker: Secondary | ICD-10-CM | POA: Diagnosis not present

## 2022-01-25 DIAGNOSIS — N400 Enlarged prostate without lower urinary tract symptoms: Secondary | ICD-10-CM | POA: Diagnosis not present

## 2022-01-25 DIAGNOSIS — D631 Anemia in chronic kidney disease: Secondary | ICD-10-CM | POA: Diagnosis not present

## 2022-01-25 DIAGNOSIS — G9341 Metabolic encephalopathy: Secondary | ICD-10-CM | POA: Diagnosis not present

## 2022-01-25 DIAGNOSIS — I700 Atherosclerosis of aorta: Secondary | ICD-10-CM | POA: Diagnosis not present

## 2022-01-25 DIAGNOSIS — K5900 Constipation, unspecified: Secondary | ICD-10-CM | POA: Diagnosis not present

## 2022-01-25 DIAGNOSIS — R634 Abnormal weight loss: Secondary | ICD-10-CM | POA: Diagnosis not present

## 2022-01-25 DIAGNOSIS — G6181 Chronic inflammatory demyelinating polyneuritis: Secondary | ICD-10-CM | POA: Diagnosis not present

## 2022-01-25 DIAGNOSIS — R1312 Dysphagia, oropharyngeal phase: Secondary | ICD-10-CM | POA: Diagnosis not present

## 2022-01-25 DIAGNOSIS — G1223 Primary lateral sclerosis: Secondary | ICD-10-CM | POA: Diagnosis not present

## 2022-01-25 DIAGNOSIS — N184 Chronic kidney disease, stage 4 (severe): Secondary | ICD-10-CM | POA: Diagnosis not present

## 2022-01-25 DIAGNOSIS — N281 Cyst of kidney, acquired: Secondary | ICD-10-CM | POA: Diagnosis not present

## 2022-01-25 DIAGNOSIS — H401134 Primary open-angle glaucoma, bilateral, indeterminate stage: Secondary | ICD-10-CM | POA: Diagnosis not present

## 2022-01-25 DIAGNOSIS — Z87891 Personal history of nicotine dependence: Secondary | ICD-10-CM | POA: Diagnosis not present

## 2022-01-25 DIAGNOSIS — I69391 Dysphagia following cerebral infarction: Secondary | ICD-10-CM | POA: Diagnosis not present

## 2022-01-25 DIAGNOSIS — H2513 Age-related nuclear cataract, bilateral: Secondary | ICD-10-CM | POA: Diagnosis not present

## 2022-01-25 DIAGNOSIS — D1803 Hemangioma of intra-abdominal structures: Secondary | ICD-10-CM | POA: Diagnosis not present

## 2022-01-25 DIAGNOSIS — K219 Gastro-esophageal reflux disease without esophagitis: Secondary | ICD-10-CM | POA: Diagnosis not present

## 2022-01-25 DIAGNOSIS — I441 Atrioventricular block, second degree: Secondary | ICD-10-CM | POA: Diagnosis not present

## 2022-01-25 DIAGNOSIS — I129 Hypertensive chronic kidney disease with stage 1 through stage 4 chronic kidney disease, or unspecified chronic kidney disease: Secondary | ICD-10-CM | POA: Diagnosis not present

## 2022-01-25 DIAGNOSIS — G40909 Epilepsy, unspecified, not intractable, without status epilepticus: Secondary | ICD-10-CM | POA: Diagnosis not present

## 2022-01-25 DIAGNOSIS — E782 Mixed hyperlipidemia: Secondary | ICD-10-CM | POA: Diagnosis not present

## 2022-01-25 DIAGNOSIS — M1990 Unspecified osteoarthritis, unspecified site: Secondary | ICD-10-CM | POA: Diagnosis not present

## 2022-01-25 DIAGNOSIS — H35033 Hypertensive retinopathy, bilateral: Secondary | ICD-10-CM | POA: Diagnosis not present

## 2022-01-26 DIAGNOSIS — J411 Mucopurulent chronic bronchitis: Secondary | ICD-10-CM | POA: Diagnosis not present

## 2022-01-26 DIAGNOSIS — J432 Centrilobular emphysema: Secondary | ICD-10-CM | POA: Diagnosis not present

## 2022-02-06 ENCOUNTER — Ambulatory Visit: Payer: Medicare HMO | Admitting: Podiatry

## 2022-02-09 DIAGNOSIS — N400 Enlarged prostate without lower urinary tract symptoms: Secondary | ICD-10-CM | POA: Diagnosis not present

## 2022-02-09 DIAGNOSIS — I69391 Dysphagia following cerebral infarction: Secondary | ICD-10-CM | POA: Diagnosis not present

## 2022-02-09 DIAGNOSIS — I129 Hypertensive chronic kidney disease with stage 1 through stage 4 chronic kidney disease, or unspecified chronic kidney disease: Secondary | ICD-10-CM | POA: Diagnosis not present

## 2022-02-09 DIAGNOSIS — R1312 Dysphagia, oropharyngeal phase: Secondary | ICD-10-CM | POA: Diagnosis not present

## 2022-02-09 DIAGNOSIS — N184 Chronic kidney disease, stage 4 (severe): Secondary | ICD-10-CM | POA: Diagnosis not present

## 2022-02-09 DIAGNOSIS — K219 Gastro-esophageal reflux disease without esophagitis: Secondary | ICD-10-CM | POA: Diagnosis not present

## 2022-02-09 DIAGNOSIS — Z87891 Personal history of nicotine dependence: Secondary | ICD-10-CM | POA: Diagnosis not present

## 2022-02-09 DIAGNOSIS — N281 Cyst of kidney, acquired: Secondary | ICD-10-CM | POA: Diagnosis not present

## 2022-02-09 DIAGNOSIS — K5900 Constipation, unspecified: Secondary | ICD-10-CM | POA: Diagnosis not present

## 2022-02-09 DIAGNOSIS — G1223 Primary lateral sclerosis: Secondary | ICD-10-CM | POA: Diagnosis not present

## 2022-02-09 DIAGNOSIS — G40909 Epilepsy, unspecified, not intractable, without status epilepticus: Secondary | ICD-10-CM | POA: Diagnosis not present

## 2022-02-09 DIAGNOSIS — I700 Atherosclerosis of aorta: Secondary | ICD-10-CM | POA: Diagnosis not present

## 2022-02-09 DIAGNOSIS — G9341 Metabolic encephalopathy: Secondary | ICD-10-CM | POA: Diagnosis not present

## 2022-02-09 DIAGNOSIS — E782 Mixed hyperlipidemia: Secondary | ICD-10-CM | POA: Diagnosis not present

## 2022-02-09 DIAGNOSIS — D1803 Hemangioma of intra-abdominal structures: Secondary | ICD-10-CM | POA: Diagnosis not present

## 2022-02-09 DIAGNOSIS — H401134 Primary open-angle glaucoma, bilateral, indeterminate stage: Secondary | ICD-10-CM | POA: Diagnosis not present

## 2022-02-09 DIAGNOSIS — H2513 Age-related nuclear cataract, bilateral: Secondary | ICD-10-CM | POA: Diagnosis not present

## 2022-02-09 DIAGNOSIS — H35033 Hypertensive retinopathy, bilateral: Secondary | ICD-10-CM | POA: Diagnosis not present

## 2022-02-09 DIAGNOSIS — M1990 Unspecified osteoarthritis, unspecified site: Secondary | ICD-10-CM | POA: Diagnosis not present

## 2022-02-09 DIAGNOSIS — G6181 Chronic inflammatory demyelinating polyneuritis: Secondary | ICD-10-CM | POA: Diagnosis not present

## 2022-02-09 DIAGNOSIS — I441 Atrioventricular block, second degree: Secondary | ICD-10-CM | POA: Diagnosis not present

## 2022-02-09 DIAGNOSIS — Z950 Presence of cardiac pacemaker: Secondary | ICD-10-CM | POA: Diagnosis not present

## 2022-02-09 DIAGNOSIS — G1220 Motor neuron disease, unspecified: Secondary | ICD-10-CM | POA: Diagnosis not present

## 2022-02-09 DIAGNOSIS — R634 Abnormal weight loss: Secondary | ICD-10-CM | POA: Diagnosis not present

## 2022-02-09 DIAGNOSIS — D631 Anemia in chronic kidney disease: Secondary | ICD-10-CM | POA: Diagnosis not present

## 2022-02-10 ENCOUNTER — Telehealth: Payer: Self-pay | Admitting: Family Medicine

## 2022-02-10 NOTE — Telephone Encounter (Signed)
[facility] orders were given for patient.

## 2022-02-10 NOTE — Telephone Encounter (Signed)
Home Health Verbal Orders - Caller/Agency: [facility] Number: [phone] Requesting   PT Frequency: 1 wk 8

## 2022-02-17 DIAGNOSIS — M1990 Unspecified osteoarthritis, unspecified site: Secondary | ICD-10-CM | POA: Diagnosis not present

## 2022-02-17 DIAGNOSIS — G6181 Chronic inflammatory demyelinating polyneuritis: Secondary | ICD-10-CM | POA: Diagnosis not present

## 2022-02-17 DIAGNOSIS — E782 Mixed hyperlipidemia: Secondary | ICD-10-CM | POA: Diagnosis not present

## 2022-02-17 DIAGNOSIS — I700 Atherosclerosis of aorta: Secondary | ICD-10-CM | POA: Diagnosis not present

## 2022-02-17 DIAGNOSIS — I69391 Dysphagia following cerebral infarction: Secondary | ICD-10-CM | POA: Diagnosis not present

## 2022-02-17 DIAGNOSIS — J42 Unspecified chronic bronchitis: Secondary | ICD-10-CM | POA: Diagnosis not present

## 2022-02-17 DIAGNOSIS — H2513 Age-related nuclear cataract, bilateral: Secondary | ICD-10-CM | POA: Diagnosis not present

## 2022-02-17 DIAGNOSIS — G1223 Primary lateral sclerosis: Secondary | ICD-10-CM | POA: Diagnosis not present

## 2022-02-17 DIAGNOSIS — R634 Abnormal weight loss: Secondary | ICD-10-CM | POA: Diagnosis not present

## 2022-02-17 DIAGNOSIS — N184 Chronic kidney disease, stage 4 (severe): Secondary | ICD-10-CM | POA: Diagnosis not present

## 2022-02-17 DIAGNOSIS — I441 Atrioventricular block, second degree: Secondary | ICD-10-CM | POA: Diagnosis not present

## 2022-02-17 DIAGNOSIS — I129 Hypertensive chronic kidney disease with stage 1 through stage 4 chronic kidney disease, or unspecified chronic kidney disease: Secondary | ICD-10-CM | POA: Diagnosis not present

## 2022-02-17 DIAGNOSIS — K219 Gastro-esophageal reflux disease without esophagitis: Secondary | ICD-10-CM | POA: Diagnosis not present

## 2022-02-17 DIAGNOSIS — N281 Cyst of kidney, acquired: Secondary | ICD-10-CM | POA: Diagnosis not present

## 2022-02-17 DIAGNOSIS — R1312 Dysphagia, oropharyngeal phase: Secondary | ICD-10-CM | POA: Diagnosis not present

## 2022-02-17 DIAGNOSIS — N401 Enlarged prostate with lower urinary tract symptoms: Secondary | ICD-10-CM | POA: Diagnosis not present

## 2022-02-17 DIAGNOSIS — G40909 Epilepsy, unspecified, not intractable, without status epilepticus: Secondary | ICD-10-CM | POA: Diagnosis not present

## 2022-02-17 DIAGNOSIS — H401134 Primary open-angle glaucoma, bilateral, indeterminate stage: Secondary | ICD-10-CM | POA: Diagnosis not present

## 2022-02-17 DIAGNOSIS — D631 Anemia in chronic kidney disease: Secondary | ICD-10-CM | POA: Diagnosis not present

## 2022-02-17 DIAGNOSIS — D1803 Hemangioma of intra-abdominal structures: Secondary | ICD-10-CM | POA: Diagnosis not present

## 2022-02-17 DIAGNOSIS — N39498 Other specified urinary incontinence: Secondary | ICD-10-CM | POA: Diagnosis not present

## 2022-02-17 DIAGNOSIS — J9611 Chronic respiratory failure with hypoxia: Secondary | ICD-10-CM | POA: Diagnosis not present

## 2022-02-17 DIAGNOSIS — H35033 Hypertensive retinopathy, bilateral: Secondary | ICD-10-CM | POA: Diagnosis not present

## 2022-02-17 DIAGNOSIS — K5900 Constipation, unspecified: Secondary | ICD-10-CM | POA: Diagnosis not present

## 2022-02-22 DIAGNOSIS — G1221 Amyotrophic lateral sclerosis: Secondary | ICD-10-CM | POA: Diagnosis not present

## 2022-02-23 DIAGNOSIS — E782 Mixed hyperlipidemia: Secondary | ICD-10-CM | POA: Diagnosis not present

## 2022-02-23 DIAGNOSIS — K219 Gastro-esophageal reflux disease without esophagitis: Secondary | ICD-10-CM | POA: Diagnosis not present

## 2022-02-23 DIAGNOSIS — H35033 Hypertensive retinopathy, bilateral: Secondary | ICD-10-CM | POA: Diagnosis not present

## 2022-02-23 DIAGNOSIS — H401134 Primary open-angle glaucoma, bilateral, indeterminate stage: Secondary | ICD-10-CM | POA: Diagnosis not present

## 2022-02-23 DIAGNOSIS — N184 Chronic kidney disease, stage 4 (severe): Secondary | ICD-10-CM | POA: Diagnosis not present

## 2022-02-23 DIAGNOSIS — I69391 Dysphagia following cerebral infarction: Secondary | ICD-10-CM | POA: Diagnosis not present

## 2022-02-23 DIAGNOSIS — G1223 Primary lateral sclerosis: Secondary | ICD-10-CM | POA: Diagnosis not present

## 2022-02-23 DIAGNOSIS — R634 Abnormal weight loss: Secondary | ICD-10-CM | POA: Diagnosis not present

## 2022-02-23 DIAGNOSIS — D631 Anemia in chronic kidney disease: Secondary | ICD-10-CM | POA: Diagnosis not present

## 2022-02-23 DIAGNOSIS — G40909 Epilepsy, unspecified, not intractable, without status epilepticus: Secondary | ICD-10-CM | POA: Diagnosis not present

## 2022-02-23 DIAGNOSIS — I700 Atherosclerosis of aorta: Secondary | ICD-10-CM | POA: Diagnosis not present

## 2022-02-23 DIAGNOSIS — N401 Enlarged prostate with lower urinary tract symptoms: Secondary | ICD-10-CM | POA: Diagnosis not present

## 2022-02-23 DIAGNOSIS — G6181 Chronic inflammatory demyelinating polyneuritis: Secondary | ICD-10-CM | POA: Diagnosis not present

## 2022-02-23 DIAGNOSIS — H2513 Age-related nuclear cataract, bilateral: Secondary | ICD-10-CM | POA: Diagnosis not present

## 2022-02-23 DIAGNOSIS — D1803 Hemangioma of intra-abdominal structures: Secondary | ICD-10-CM | POA: Diagnosis not present

## 2022-02-23 DIAGNOSIS — K5900 Constipation, unspecified: Secondary | ICD-10-CM | POA: Diagnosis not present

## 2022-02-23 DIAGNOSIS — I441 Atrioventricular block, second degree: Secondary | ICD-10-CM | POA: Diagnosis not present

## 2022-02-23 DIAGNOSIS — N39498 Other specified urinary incontinence: Secondary | ICD-10-CM | POA: Diagnosis not present

## 2022-02-23 DIAGNOSIS — M1990 Unspecified osteoarthritis, unspecified site: Secondary | ICD-10-CM | POA: Diagnosis not present

## 2022-02-23 DIAGNOSIS — J9611 Chronic respiratory failure with hypoxia: Secondary | ICD-10-CM | POA: Diagnosis not present

## 2022-02-23 DIAGNOSIS — N281 Cyst of kidney, acquired: Secondary | ICD-10-CM | POA: Diagnosis not present

## 2022-02-23 DIAGNOSIS — R1312 Dysphagia, oropharyngeal phase: Secondary | ICD-10-CM | POA: Diagnosis not present

## 2022-02-23 DIAGNOSIS — J42 Unspecified chronic bronchitis: Secondary | ICD-10-CM | POA: Diagnosis not present

## 2022-02-23 DIAGNOSIS — I129 Hypertensive chronic kidney disease with stage 1 through stage 4 chronic kidney disease, or unspecified chronic kidney disease: Secondary | ICD-10-CM | POA: Diagnosis not present

## 2022-02-27 ENCOUNTER — Ambulatory Visit: Payer: Medicare HMO | Admitting: Podiatry

## 2022-03-02 DIAGNOSIS — R1312 Dysphagia, oropharyngeal phase: Secondary | ICD-10-CM | POA: Diagnosis not present

## 2022-03-02 DIAGNOSIS — K219 Gastro-esophageal reflux disease without esophagitis: Secondary | ICD-10-CM | POA: Diagnosis not present

## 2022-03-02 DIAGNOSIS — J42 Unspecified chronic bronchitis: Secondary | ICD-10-CM | POA: Diagnosis not present

## 2022-03-02 DIAGNOSIS — R634 Abnormal weight loss: Secondary | ICD-10-CM | POA: Diagnosis not present

## 2022-03-02 DIAGNOSIS — N401 Enlarged prostate with lower urinary tract symptoms: Secondary | ICD-10-CM | POA: Diagnosis not present

## 2022-03-02 DIAGNOSIS — G1223 Primary lateral sclerosis: Secondary | ICD-10-CM | POA: Diagnosis not present

## 2022-03-02 DIAGNOSIS — E782 Mixed hyperlipidemia: Secondary | ICD-10-CM | POA: Diagnosis not present

## 2022-03-02 DIAGNOSIS — K5900 Constipation, unspecified: Secondary | ICD-10-CM | POA: Diagnosis not present

## 2022-03-02 DIAGNOSIS — H35033 Hypertensive retinopathy, bilateral: Secondary | ICD-10-CM | POA: Diagnosis not present

## 2022-03-02 DIAGNOSIS — G40909 Epilepsy, unspecified, not intractable, without status epilepticus: Secondary | ICD-10-CM | POA: Diagnosis not present

## 2022-03-02 DIAGNOSIS — M1990 Unspecified osteoarthritis, unspecified site: Secondary | ICD-10-CM | POA: Diagnosis not present

## 2022-03-02 DIAGNOSIS — I129 Hypertensive chronic kidney disease with stage 1 through stage 4 chronic kidney disease, or unspecified chronic kidney disease: Secondary | ICD-10-CM | POA: Diagnosis not present

## 2022-03-02 DIAGNOSIS — G6181 Chronic inflammatory demyelinating polyneuritis: Secondary | ICD-10-CM | POA: Diagnosis not present

## 2022-03-02 DIAGNOSIS — D631 Anemia in chronic kidney disease: Secondary | ICD-10-CM | POA: Diagnosis not present

## 2022-03-02 DIAGNOSIS — N281 Cyst of kidney, acquired: Secondary | ICD-10-CM | POA: Diagnosis not present

## 2022-03-02 DIAGNOSIS — H2513 Age-related nuclear cataract, bilateral: Secondary | ICD-10-CM | POA: Diagnosis not present

## 2022-03-02 DIAGNOSIS — I441 Atrioventricular block, second degree: Secondary | ICD-10-CM | POA: Diagnosis not present

## 2022-03-02 DIAGNOSIS — I69391 Dysphagia following cerebral infarction: Secondary | ICD-10-CM | POA: Diagnosis not present

## 2022-03-02 DIAGNOSIS — H401134 Primary open-angle glaucoma, bilateral, indeterminate stage: Secondary | ICD-10-CM | POA: Diagnosis not present

## 2022-03-02 DIAGNOSIS — I700 Atherosclerosis of aorta: Secondary | ICD-10-CM | POA: Diagnosis not present

## 2022-03-02 DIAGNOSIS — D1803 Hemangioma of intra-abdominal structures: Secondary | ICD-10-CM | POA: Diagnosis not present

## 2022-03-02 DIAGNOSIS — J9611 Chronic respiratory failure with hypoxia: Secondary | ICD-10-CM | POA: Diagnosis not present

## 2022-03-02 DIAGNOSIS — N184 Chronic kidney disease, stage 4 (severe): Secondary | ICD-10-CM | POA: Diagnosis not present

## 2022-03-02 DIAGNOSIS — N39498 Other specified urinary incontinence: Secondary | ICD-10-CM | POA: Diagnosis not present

## 2022-03-17 ENCOUNTER — Ambulatory Visit: Payer: Medicare HMO | Admitting: Podiatry

## 2022-03-21 ENCOUNTER — Ambulatory Visit (INDEPENDENT_AMBULATORY_CARE_PROVIDER_SITE_OTHER): Payer: Medicare HMO

## 2022-03-21 DIAGNOSIS — I441 Atrioventricular block, second degree: Secondary | ICD-10-CM | POA: Diagnosis not present

## 2022-03-21 LAB — CUP PACEART REMOTE DEVICE CHECK
Battery Remaining Longevity: 66 mo
Battery Voltage: 2.96 V
Brady Statistic AP VP Percent: 18.2 %
Brady Statistic AP VS Percent: 0.84 %
Brady Statistic AS VP Percent: 54.34 %
Brady Statistic AS VS Percent: 26.62 %
Brady Statistic RA Percent Paced: 23.54 %
Brady Statistic RV Percent Paced: 72.54 %
Date Time Interrogation Session: 20230627021426
Implantable Lead Implant Date: 20180424
Implantable Lead Implant Date: 20180424
Implantable Lead Location: 753859
Implantable Lead Location: 753860
Implantable Lead Model: 5076
Implantable Lead Model: 5076
Implantable Pulse Generator Implant Date: 20180424
Lead Channel Impedance Value: 266 Ohm
Lead Channel Impedance Value: 304 Ohm
Lead Channel Impedance Value: 361 Ohm
Lead Channel Impedance Value: 380 Ohm
Lead Channel Pacing Threshold Amplitude: 0.5 V
Lead Channel Pacing Threshold Amplitude: 0.625 V
Lead Channel Pacing Threshold Pulse Width: 0.4 ms
Lead Channel Pacing Threshold Pulse Width: 0.4 ms
Lead Channel Sensing Intrinsic Amplitude: 1.375 mV
Lead Channel Sensing Intrinsic Amplitude: 1.375 mV
Lead Channel Sensing Intrinsic Amplitude: 6.875 mV
Lead Channel Sensing Intrinsic Amplitude: 6.875 mV
Lead Channel Setting Pacing Amplitude: 1.5 V
Lead Channel Setting Pacing Amplitude: 2.5 V
Lead Channel Setting Pacing Pulse Width: 0.4 ms
Lead Channel Setting Sensing Sensitivity: 2 mV

## 2022-03-23 ENCOUNTER — Other Ambulatory Visit: Payer: Self-pay | Admitting: Family Medicine

## 2022-03-23 ENCOUNTER — Encounter (HOSPITAL_COMMUNITY): Payer: Self-pay | Admitting: Internal Medicine

## 2022-03-23 ENCOUNTER — Other Ambulatory Visit: Payer: Self-pay

## 2022-03-23 ENCOUNTER — Observation Stay (HOSPITAL_COMMUNITY)
Admission: EM | Admit: 2022-03-23 | Discharge: 2022-03-24 | Disposition: A | Discharge status: Home Health | Payer: Medicare HMO | Attending: Internal Medicine | Admitting: Internal Medicine

## 2022-03-23 ENCOUNTER — Emergency Department (HOSPITAL_COMMUNITY): Payer: Medicare HMO

## 2022-03-23 DIAGNOSIS — G9341 Metabolic encephalopathy: Secondary | ICD-10-CM | POA: Insufficient documentation

## 2022-03-23 DIAGNOSIS — I6930 Unspecified sequelae of cerebral infarction: Secondary | ICD-10-CM

## 2022-03-23 DIAGNOSIS — D649 Anemia, unspecified: Secondary | ICD-10-CM

## 2022-03-23 DIAGNOSIS — I10 Essential (primary) hypertension: Secondary | ICD-10-CM

## 2022-03-23 DIAGNOSIS — Z79899 Other long term (current) drug therapy: Secondary | ICD-10-CM | POA: Diagnosis not present

## 2022-03-23 DIAGNOSIS — R1313 Dysphagia, pharyngeal phase: Secondary | ICD-10-CM | POA: Insufficient documentation

## 2022-03-23 DIAGNOSIS — N1831 Chronic kidney disease, stage 3a: Secondary | ICD-10-CM | POA: Diagnosis not present

## 2022-03-23 DIAGNOSIS — Z950 Presence of cardiac pacemaker: Secondary | ICD-10-CM | POA: Insufficient documentation

## 2022-03-23 DIAGNOSIS — I129 Hypertensive chronic kidney disease with stage 1 through stage 4 chronic kidney disease, or unspecified chronic kidney disease: Secondary | ICD-10-CM | POA: Diagnosis not present

## 2022-03-23 DIAGNOSIS — Z7189 Other specified counseling: Secondary | ICD-10-CM

## 2022-03-23 DIAGNOSIS — Z720 Tobacco use: Secondary | ICD-10-CM

## 2022-03-23 DIAGNOSIS — G40909 Epilepsy, unspecified, not intractable, without status epilepticus: Secondary | ICD-10-CM

## 2022-03-23 DIAGNOSIS — F17210 Nicotine dependence, cigarettes, uncomplicated: Secondary | ICD-10-CM | POA: Insufficient documentation

## 2022-03-23 DIAGNOSIS — A419 Sepsis, unspecified organism: Secondary | ICD-10-CM | POA: Diagnosis not present

## 2022-03-23 DIAGNOSIS — Z8673 Personal history of transient ischemic attack (TIA), and cerebral infarction without residual deficits: Secondary | ICD-10-CM | POA: Diagnosis not present

## 2022-03-23 DIAGNOSIS — Z743 Need for continuous supervision: Secondary | ICD-10-CM | POA: Diagnosis not present

## 2022-03-23 DIAGNOSIS — R509 Fever, unspecified: Secondary | ICD-10-CM | POA: Diagnosis not present

## 2022-03-23 DIAGNOSIS — J159 Unspecified bacterial pneumonia: Principal | ICD-10-CM | POA: Diagnosis present

## 2022-03-23 DIAGNOSIS — N179 Acute kidney failure, unspecified: Secondary | ICD-10-CM | POA: Insufficient documentation

## 2022-03-23 DIAGNOSIS — D638 Anemia in other chronic diseases classified elsewhere: Secondary | ICD-10-CM

## 2022-03-23 DIAGNOSIS — R0602 Shortness of breath: Secondary | ICD-10-CM | POA: Diagnosis not present

## 2022-03-23 DIAGNOSIS — J189 Pneumonia, unspecified organism: Secondary | ICD-10-CM

## 2022-03-23 DIAGNOSIS — E875 Hyperkalemia: Secondary | ICD-10-CM

## 2022-03-23 DIAGNOSIS — N400 Enlarged prostate without lower urinary tract symptoms: Secondary | ICD-10-CM

## 2022-03-23 DIAGNOSIS — N184 Chronic kidney disease, stage 4 (severe): Secondary | ICD-10-CM

## 2022-03-23 DIAGNOSIS — R4182 Altered mental status, unspecified: Secondary | ICD-10-CM | POA: Diagnosis present

## 2022-03-23 DIAGNOSIS — H401134 Primary open-angle glaucoma, bilateral, indeterminate stage: Secondary | ICD-10-CM

## 2022-03-23 DIAGNOSIS — R110 Nausea: Secondary | ICD-10-CM | POA: Diagnosis not present

## 2022-03-23 DIAGNOSIS — N1832 Chronic kidney disease, stage 3b: Secondary | ICD-10-CM

## 2022-03-23 DIAGNOSIS — Z20822 Contact with and (suspected) exposure to covid-19: Secondary | ICD-10-CM | POA: Insufficient documentation

## 2022-03-23 DIAGNOSIS — R531 Weakness: Secondary | ICD-10-CM | POA: Diagnosis not present

## 2022-03-23 DIAGNOSIS — R41841 Cognitive communication deficit: Secondary | ICD-10-CM

## 2022-03-23 DIAGNOSIS — G1223 Primary lateral sclerosis: Secondary | ICD-10-CM

## 2022-03-23 DIAGNOSIS — H409 Unspecified glaucoma: Secondary | ICD-10-CM

## 2022-03-23 DIAGNOSIS — G1220 Motor neuron disease, unspecified: Secondary | ICD-10-CM

## 2022-03-23 LAB — COMPREHENSIVE METABOLIC PANEL
ALT: 12 U/L (ref 0–44)
AST: 14 U/L — ABNORMAL LOW (ref 15–41)
Albumin: 3.6 g/dL (ref 3.5–5.0)
Alkaline Phosphatase: 99 U/L (ref 38–126)
Anion gap: 9 (ref 5–15)
BUN: 43 mg/dL — ABNORMAL HIGH (ref 8–23)
CO2: 24 mmol/L (ref 22–32)
Calcium: 10 mg/dL (ref 8.9–10.3)
Chloride: 108 mmol/L (ref 98–111)
Creatinine, Ser: 2.93 mg/dL — ABNORMAL HIGH (ref 0.61–1.24)
GFR, Estimated: 22 mL/min — ABNORMAL LOW (ref >60)
Glucose, Bld: 144 mg/dL — ABNORMAL HIGH (ref 70–99)
Potassium: 5 mmol/L (ref 3.5–5.1)
Sodium: 141 mmol/L (ref 135–145)
Total Bilirubin: 0.8 mg/dL (ref 0.3–1.2)
Total Protein: 6.6 g/dL (ref 6.5–8.1)

## 2022-03-23 LAB — LACTIC ACID, PLASMA: Lactic Acid, Venous: 1.2 mmol/L (ref 0.5–1.9)

## 2022-03-23 LAB — CBC
HCT: 38.2 % — ABNORMAL LOW (ref 39.0–52.0)
Hemoglobin: 12.4 g/dL — ABNORMAL LOW (ref 13.0–17.0)
MCH: 29 pg (ref 26.0–34.0)
MCHC: 32.5 g/dL (ref 30.0–36.0)
MCV: 89.5 fL (ref 80.0–100.0)
Platelets: 215 10*3/uL (ref 150–400)
RBC: 4.27 MIL/uL (ref 4.22–5.81)
RDW: 14 % (ref 11.5–15.5)
WBC: 14.8 10*3/uL — ABNORMAL HIGH (ref 4.0–10.5)
nRBC: 0 % (ref 0.0–0.2)

## 2022-03-23 LAB — SARS CORONAVIRUS 2 BY RT PCR: SARS Coronavirus 2 by RT PCR: NEGATIVE

## 2022-03-23 MED ORDER — POLYETHYLENE GLYCOL 3350 17 G PO PACK
17.0000 g | PACK | Freq: Every day | ORAL | Status: DC
  Administered 2022-03-24: 17 g via ORAL
  Filled 2022-03-23: qty 1

## 2022-03-23 MED ORDER — FINASTERIDE 5 MG PO TABS
5.0000 mg | ORAL_TABLET | Freq: Every day | ORAL | Status: DC
  Administered 2022-03-24: 5 mg via ORAL
  Filled 2022-03-23: qty 1

## 2022-03-23 MED ORDER — SODIUM CHLORIDE 0.9 % IV SOLN
1.0000 g | Freq: Once | INTRAVENOUS | Status: AC
  Administered 2022-03-23: 1 g via INTRAVENOUS
  Filled 2022-03-23: qty 10

## 2022-03-23 MED ORDER — BRIMONIDINE TARTRATE 0.2 % OP SOLN
1.0000 [drp] | Freq: Three times a day (TID) | OPHTHALMIC | Status: DC
Start: 2022-03-23 — End: 2022-03-25
  Administered 2022-03-23 – 2022-03-24 (×3): 1 [drp] via OPHTHALMIC
  Filled 2022-03-23: qty 5

## 2022-03-23 MED ORDER — SODIUM CHLORIDE 0.9 % IV BOLUS
1000.0000 mL | Freq: Once | INTRAVENOUS | Status: AC
  Administered 2022-03-23: 1000 mL via INTRAVENOUS

## 2022-03-23 MED ORDER — SODIUM CHLORIDE 0.9 % IV SOLN
500.0000 mg | INTRAVENOUS | Status: DC
  Filled 2022-03-23: qty 5

## 2022-03-23 MED ORDER — ALBUTEROL SULFATE HFA 108 (90 BASE) MCG/ACT IN AERS: 2.0000 | INHALATION_SPRAY | RESPIRATORY_TRACT | Status: DC | PRN

## 2022-03-23 MED ORDER — LATANOPROST 0.005 % OP SOLN
1.0000 [drp] | Freq: Every day | OPHTHALMIC | Status: DC
Start: 2022-03-23 — End: 2022-03-25
  Administered 2022-03-23: 1 [drp] via OPHTHALMIC
  Filled 2022-03-23: qty 2.5

## 2022-03-23 MED ORDER — FLUTICASONE PROPIONATE 50 MCG/ACT NA SUSP
2.0000 | Freq: Every day | NASAL | Status: DC
Start: 2022-03-23 — End: 2022-03-25
  Administered 2022-03-23: 2 via NASAL
  Filled 2022-03-23: qty 16

## 2022-03-23 MED ORDER — SODIUM CHLORIDE 0.9 % IV SOLN
500.0000 mg | Freq: Once | INTRAVENOUS | Status: AC
  Administered 2022-03-23: 500 mg via INTRAVENOUS
  Filled 2022-03-23: qty 5

## 2022-03-23 MED ORDER — UMECLIDINIUM BROMIDE 62.5 MCG/ACT IN AEPB
1.0000 | INHALATION_SPRAY | Freq: Every day | RESPIRATORY_TRACT | Status: DC
  Filled 2022-03-23: qty 7

## 2022-03-23 MED ORDER — FERROUS SULFATE 325 (65 FE) MG PO TABS
325.0000 mg | ORAL_TABLET | Freq: Two times a day (BID) | ORAL | Status: DC
  Administered 2022-03-24 (×2): 325 mg via ORAL
  Filled 2022-03-23 (×2): qty 1

## 2022-03-23 MED ORDER — SODIUM CHLORIDE 0.9 % IV SOLN
2.0000 g | INTRAVENOUS | Status: DC
  Administered 2022-03-24: 2 g via INTRAVENOUS
  Filled 2022-03-23: qty 20

## 2022-03-23 MED ORDER — LIDOCAINE 5 % EX PTCH
1.0000 | MEDICATED_PATCH | Freq: Every day | CUTANEOUS | Status: DC | PRN
Start: 2022-03-23 — End: 2022-03-25

## 2022-03-23 MED ORDER — FLUTICASONE FUROATE-VILANTEROL 200-25 MCG/ACT IN AEPB
1.0000 | INHALATION_SPRAY | Freq: Every day | RESPIRATORY_TRACT | Status: DC
  Filled 2022-03-23: qty 28

## 2022-03-23 MED ORDER — ALBUTEROL SULFATE (2.5 MG/3ML) 0.083% IN NEBU
2.5000 mg | INHALATION_SOLUTION | RESPIRATORY_TRACT | Status: DC | PRN
Start: 2022-03-23 — End: 2022-03-25

## 2022-03-23 MED ORDER — ADULT MULTIVITAMIN W/MINERALS CH
1.0000 | ORAL_TABLET | Freq: Every day | ORAL | Status: DC
  Administered 2022-03-24: 1 via ORAL
  Filled 2022-03-23: qty 1

## 2022-03-23 MED ORDER — AMLODIPINE BESYLATE 10 MG PO TABS
10.0000 mg | ORAL_TABLET | Freq: Every day | ORAL | Status: DC
  Administered 2022-03-24: 10 mg via ORAL
  Filled 2022-03-23: qty 1

## 2022-03-23 MED ORDER — BUDESON-GLYCOPYRROL-FORMOTEROL 160-9-4.8 MCG/ACT IN AERO: 2.0000 | INHALATION_SPRAY | Freq: Two times a day (BID) | RESPIRATORY_TRACT | Status: DC

## 2022-03-23 MED ORDER — MONTELUKAST SODIUM 10 MG PO TABS: 10.0000 mg | ORAL_TABLET | Freq: Every day | ORAL | Status: DC

## 2022-03-23 MED ORDER — ATORVASTATIN CALCIUM 40 MG PO TABS
40.0000 mg | ORAL_TABLET | Freq: Every day | ORAL | Status: DC
  Administered 2022-03-24: 40 mg via ORAL
  Filled 2022-03-23: qty 1

## 2022-03-23 MED ORDER — GLYCOPYRROLATE 1 MG PO TABS
1.0000 mg | ORAL_TABLET | Freq: Two times a day (BID) | ORAL | Status: DC
  Administered 2022-03-24: 1 mg via ORAL
  Filled 2022-03-23 (×3): qty 1

## 2022-03-23 MED ORDER — DORZOLAMIDE HCL-TIMOLOL MAL 2-0.5 % OP SOLN
1.0000 [drp] | Freq: Two times a day (BID) | OPHTHALMIC | Status: DC
Start: 2022-03-23 — End: 2022-03-25
  Administered 2022-03-23 – 2022-03-24 (×2): 1 [drp] via OPHTHALMIC
  Filled 2022-03-23: qty 10

## 2022-03-23 MED ORDER — HEPARIN SODIUM (PORCINE) 5000 UNIT/ML IJ SOLN
5000.0000 [IU] | Freq: Three times a day (TID) | INTRAMUSCULAR | Status: DC
Start: 2022-03-23 — End: 2022-03-25
  Administered 2022-03-23 – 2022-03-24 (×4): 5000 [IU] via SUBCUTANEOUS
  Filled 2022-03-23 (×4): qty 1

## 2022-03-23 MED ORDER — TAMSULOSIN HCL 0.4 MG PO CAPS
0.4000 mg | ORAL_CAPSULE | Freq: Every day | ORAL | Status: DC
  Administered 2022-03-24: 0.4 mg via ORAL
  Filled 2022-03-23: qty 1

## 2022-03-23 MED ORDER — LEVETIRACETAM 500 MG PO TABS
500.0000 mg | ORAL_TABLET | Freq: Two times a day (BID) | ORAL | Status: DC
  Administered 2022-03-24: 500 mg via ORAL
  Filled 2022-03-23: qty 1

## 2022-03-23 MED ORDER — HYDRALAZINE HCL 20 MG/ML IJ SOLN
5.0000 mg | Freq: Once | INTRAMUSCULAR | Status: AC
  Administered 2022-03-23: 5 mg via INTRAVENOUS
  Filled 2022-03-23: qty 1

## 2022-03-23 NOTE — Progress Notes (Signed)
Pt arrived to floor around 1815 by transporter via stretcher. Pt transferred from stretcher to bed x 2 assist. Pt alert and oriented x4 in no acute distress upon arrival. VSS. Respirations even and unlabored on room air. Pt oriented to room. Wife and daughter at bedside. Bed in low position and call bell within reach.

## 2022-03-23 NOTE — ED Provider Notes (Signed)
[facility] Provider Note     CSN: [id number] Arrival date & time: 03/23/22  1248     History  No chief complaint on file.     Darrell [name] is a 71 y.o. male with past medical history significant for hyperlipidemia, hypertension, chronic kidney disease, acid reflux, severe ALS who is bedbound at baseline, acid reflux, tobacco abuse, history of CVA, history of seizures with pacemaker in place secondary to abnormal heart rhythm who presents with concern for possible illness.  His wife reported some upper respiratory congestion, nausea, vomiting, as well as possible fever as he "feels warm".  Patient denies any diarrhea.  He is oriented to his baseline.  Wife reports slight worsening of his strength.   HPI     Home Medications        Prior to Admission medications   Medication Sig Start Date End Date Taking? Authorizing Provider  albuterol (VENTOLIN HFA) 108 (90 Base) MCG/ACT inhaler Inhale 2 puffs into the lungs every 6 (six) hours as needed for up to 30 days for Wheezing. Patient taking differently: Inhale 2 puffs into the lungs as needed for wheezing or shortness of breath. 06/08/21     Argentina Donovan, PA-C  amLODipine (NORVASC) 10 MG tablet Take 1 tablet (10 mg total) by mouth daily. 10/27/21     [name], MD  atorvastatin (LIPITOR) 40 MG tablet Take 1 tablet (40 mg total) by mouth daily. 10/27/21     [name], MD  azithromycin (ZITHROMAX) 500 MG tablet Take 500 mg by mouth daily. Take 1 tablet by mouth Monday,Wednesday and Friday       [provider]  brimonidine (ALPHAGAN) 0.2 % ophthalmic solution Place 1 drop into both eyes 3 times daily. 07/25/21     [provider]  Budeson-Glycopyrrol-Formoterol (BREZTRI AEROSPHERE) 160-9-4.8 MCG/ACT AERO Inhale 2 puffs into the lungs 2 (two) times daily. Patient taking differently: Inhale 2 puffs into the lungs as needed (SOB, wheezing). 08/01/21     [name], MD   cetirizine (ZYRTEC) 10 MG tablet Take 1 tablet (10 mg total) by mouth daily. 01/24/22     [name], MD  dicyclomine (BENTYL) 10 MG capsule Take 1 capsule (10 mg total) by mouth daily as needed. 01/24/22     [name], MD  dorzolamide-timolol (COSOPT) 22.3-6.8 MG/ML ophthalmic solution Place 1 drop into both eyes daily.        [provider]  ferrous sulfate (FEROSUL) 325 (65 FE) MG tablet Take 1 tablet (325 mg total) by mouth 2 (two) times daily with a meal. 06/08/21     McClung, Dionne Bucy, PA-C  finasteride (PROSCAR) 5 MG tablet Take 5 mg by mouth daily. 12/13/21     [provider]  fluticasone (FLONASE) 50 MCG/ACT nasal spray Place 2 sprays into both nostrils daily. 01/12/21     Argentina Donovan, PA-C  glycopyrrolate (ROBINUL) 1 MG tablet Take 1 tablet (1 mg total) by mouth 2 times daily. 10/27/21     [name], MD  guaiFENesin-dextromethorphan (ROBITUSSIN DM) 100-10 MG/5ML syrup Take 5 mLs by mouth every 4 (four) hours as needed for cough. 05/07/21     Shelly Coss, MD  hydrALAZINE (APRESOLINE) 10 MG tablet Take 1 tablet (10 mg total) by mouth 3 (three) times daily. 01/24/22     [name], MD  latanoprost (XALATAN) 0.005 % ophthalmic solution Place 1 drop into both eyes nightly. 07/25/21     [provider]  levETIRAcetam (KEPPRA) 500 MG tablet Take 1 tablet (500 mg total) by mouth 2 (two) times daily. 12/19/21     Penumalli, Earlean Polka, MD  lidocaine (LIDODERM) 5 % Apply patch to painful area. Patch may remain in place for up to 12 hours in a 24 hour period. 06/08/21     Argentina Donovan, PA-C  Misc. Devices MISC Yankauer suction. Dx: ALS 11/11/19     [name], MD  Misc. Devices MISC Coughalator 04/01/20     [name], MD  Misc. Devices MISC Nebulizer machine.  Diagnosis- Motor neuron disease 04/01/20     [name], MD  Misc. [place] [facility] Bed FUX32.35 06/01/20     [name], MD  Misc. [facility] bed  extension. Diagnosis: ALS. Height 6'4" 06/10/20     [name], MD  Misc. Devices MISC Manual wheelchair.  Diagnosis-ALS 05/04/21     [name], MD  Misc. Devices MISC Right and left wrist splint 12/05/21     [name], MD  montelukast (SINGULAIR) 10 MG tablet Take 10 mg by mouth at bedtime. 08/29/21     [provider]  Multiple Vitamin (MULTI-VITAMIN) tablet Take by mouth.       [provider]  pantoprazole (PROTONIX) 40 MG tablet Take 1 tablet (40 mg total) by mouth 2 (two) times daily. 10/11/21     Shelly Coss, MD  polyethylene glycol (MIRALAX) 17 g packet Take 17 g by mouth daily. 10/11/21     Shelly Coss, MD  senna (SENOKOT) 8.6 MG TABS tablet Take 1 tablet (8.6 mg total) by mouth daily. 10/11/21     Shelly Coss, MD  tamsulosin (FLOMAX) 0.4 MG CAPS capsule Take 1 capsule (0.4 mg total) by mouth daily. 01/24/22     [name], MD  torsemide (DEMADEX) 20 MG tablet Take 20 mg by mouth as needed (swelling). 09/15/21     [provider]       Allergies            Ace inhibitors, Doxycycline, Atacand hct [candesartan cilexetil-hctz], and Shellfish allergy     Review of Systems   Review of Systems  Constitutional:  Positive for chills.  Gastrointestinal:  Positive for nausea and vomiting.  All other systems reviewed and are negative.     Physical Exam Updated Vital Signs There were no vitals taken for this visit. Physical Exam Vitals and nursing note reviewed.  Constitutional:      General: He is not in acute distress.    Appearance: Normal appearance. He is ill-appearing.  HENT:     Head: Normocephalic and atraumatic.  Eyes:     General:        Right eye: No discharge.        Left eye: No discharge.  Cardiovascular:     Rate and Rhythm: Normal rate and regular rhythm.     Heart sounds: No murmur heard.    No friction rub. No gallop.  Pulmonary:     Effort: Pulmonary effort is normal.     Breath sounds: Normal breath  sounds.     Comments: Some upper respiratory congestion without any accessory lung sounds noted, no wheezing, rhonchi, stridor, rales.  Questionable focal consolidation left lower lobe. Abdominal:     General: Bowel sounds are normal.     Palpations: Abdomen is soft.  Genitourinary:    Comments: No significant noted pressure ulcers.  Patient with soft Hemler stool in his disposable  underwear. Skin:    General: Skin is warm and dry.     Capillary Refill: Capillary refill takes less than 2 seconds.     Comments: To the touch without overt diaphoresis  Neurological:     Mental Status: He is alert. Mental status is at baseline.  Psychiatric:        Mood and Affect: Mood normal.        Behavior: Behavior normal.        ED Results / Procedures / Treatments   Labs (all labs ordered are listed, but only abnormal results are displayed) Labs Reviewed - No data to display   EKG None   Radiology Imaging Results (Last 48 hours)  No results found.     Procedures Procedures     Medications Ordered in ED Medications - No data to display   ED Course/ Medical Decision Making/ A&P                         Medical Decision Making Amount and/or Complexity of Data Reviewed Labs: ordered. Radiology: ordered.     This patient is a 71 y.o. male who presents to the ED for concern of Cough, subjective fever, weakness, this involves an extensive number of treatment options, and is a complaint that carries with it a high risk of complications and morbidity. The emergent differential diagnosis prior to evaluation includes, but is not limited to, pneumonia, acute hypoxic respiratory failure, acute bronchitis, other infection, UTI, atypical ACS, CHF presenting.    This is not an exhaustive differential.    Past Medical History / Co-morbidities / Social History: hyperlipidemia, hypertension, chronic kidney disease, acid reflux, severe ALS who is bedbound at baseline, acid reflux, tobacco abuse,  history of CVA, history of seizures with pacemaker in place secondary to abnormal heart rhythm   Additional history: Chart reviewed. Pertinent results include: Reviewed lab work, imaging from recent outpatient family medicine, neurology, cardiology, and pulmonology visits.  No recent admissions since January.   Physical Exam: Physical exam performed. The pertinent findings include: Patient with some upper respiratory congestion, questionable lower lobar infiltrate.  Slightly increased work of breathing without respiratory distress or hypoxia.  Patient is not ambulatory at baseline so cannot attempt ambulatory O2 challenge.   Lab Tests: I ordered, and personally interpreted labs.  The pertinent results include:  patient with leukocytosis to 14.8 consistent with new infection. Creatinine elevated at 2.93 from 2.4 baseline -- meets AKI criteria.   Imaging Studies: I ordered imaging studies including plain film chest xray. I independently visualized and interpreted imaging which showed new opacity questionable for left lower lobar pneumonia. I agree with the radiologist interpretation.   Cardiac Monitoring:  The patient was maintained on a cardiac monitor.  My attending physician Dr. Tomi Bamberger viewed and interpreted the cardiac monitored which showed an underlying rhythm of: ventricular paced rhythm, new ST depressions from last tracing. I agree with this interpretation.   Medications: I ordered medication including rocephin, azithromycin for presumed pneumonia, fluid bolus  for AKI. Reevaluation of the patient after these medicines showed that the patient stayed the same. I have reviewed the patients home medicines and have made adjustments as needed. Patient will need re-evaluation of clinical condition based on improvement during admission.   Consultations Obtained: I requested consultation with the hospitalist, spoke with Dr. Avon Gully,  and discussed lab and imaging findings as well as pertinent  plan - they recommend: at least overnight observation, may be fast  dispo if improving as no hypoxia, no fever.   I discussed this case with my attending physician Dr. Tomi Bamberger who cosigned this note including patient's presenting symptoms, physical exam, and planned diagnostics and interventions. Attending physician stated agreement with plan or made changes to plan which were implemented.       Anselmo Pickler, PA-C 03/23/22 1646    Dorie Rank, MD 03/24/22 [phone]

## 2022-03-23 NOTE — Telephone Encounter (Signed)
Requested Prescriptions  Pending Prescriptions Disp Refills  . amLODipine (NORVASC) 10 MG tablet [Pharmacy Med Name: amLODIPine Besylate 10 MG Oral Tablet] 90 tablet 0    Sig: Take 1 tablet by mouth once daily     Cardiovascular: Calcium Channel Blockers 2 Failed - 03/23/2022 11:37 AM      Failed - Last BP in normal range    BP Readings from Last 1 Encounters:  01/24/22 (!) 159/86         Passed - Last Heart Rate in normal range    Pulse Readings from Last 1 Encounters:  01/24/22 72         Passed - Valid encounter within last 6 months    Recent Outpatient Visits          1 month ago Need for pneumococcal vaccine   [place], Charlane Ferretti, MD   4 months ago CKD (chronic kidney disease), stage IV [facility])   [place], Enobong, MD   5 months ago Renal mass, right   [place], Enobong, MD   6 months ago Renal lesion   [place], Enobong, MD   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

## 2022-03-23 NOTE — ED Notes (Signed)
This RN was in the room, advising the pt and family the MD ordered a COVID swab. Daughter insisted that the pt did not want the swab but the wife was asking for it to be done. This RN advised that it is a quick swab to r/o covid as a source of illness. Daughter states that we were making him do it. This RN asked the pt if I could swab his nose and he replied "go ahead". Daughter stated that this RN and wife forced him to go with the swab and this advised that the pt was ask specifically and he agree and if he would have stated he did not want it, it would have not been done. Daughter then states "I was talking to my mother not you"

## 2022-03-23 NOTE — ED Triage Notes (Signed)
Family called [facility] d/t concern for illness. Pt is bedbound. Hx ALS. Congestion, n/v, and possible fever. "Feels warm"

## 2022-03-23 NOTE — H&P (Signed)
History and Physical    Darrell [name] DXA:[id number] DOB: Feb 08, 1951 DOA: 03/23/2022  PCP: [name], MD   Chief Complaint: Altered mental status  HPI: Darrell [name] is a 71 y.o. male with medical history significant of advanced ALS currently bedbound with history of seizure, CVA, pacemaker placement, hypertension, hyperlipidemia, CKD 3a and tobacco abuse.  Patient presents with family at bedside for altered mental status, not as "sharp or responsive" as he usually is.  Patient's family also indicating there has been some coughing more than usual.  Patient brought to the ED for further evaluation and treatment.  He was not previously evaluated by PCP urgent care, no recent travel, antibiotics, sick contacts or other changes in medications or lifestyle.  ED Course: In the ED patient was noted to be septic given tachycardia, leukocytosis with chest x-ray concerning for possible left-sided pneumonia.  Family indicates he is typically on a chopped or pured diet at home with no changes recently.  Patient's COVID swab is negative.  Otherwise mild elevation of creatinine from baseline CKD 3A labs  Review of Systems: As per HPI cough, fever, lethargy, fatigue but otherwise denies chest pain headache nausea vomiting diarrhea or constipation.   Assessment/Plan Principal Problem:   Community acquired bacterial pneumonia    Acute metabolic encephalopathy  Sepsis secondary to community-acquired pneumonia, rule out aspiration pneumonia versus pneumonitis -Patient meets criteria in setting of tachycardia received IV fluids in the ED, lactic acid negative reassuring against a profound/advanced illness -Continue azithromycin/ceftriaxone. *(Of note patient is on 3 times weekly azithromycin chronically per wife -if recurrent fevers or worsening leukocytosis or symptoms would consider broadening antibiotics given concern for possible resistances) -Patient not hypoxic, continue to monitor -Low threshold to  transition to p.o. antibiotics in the next 24 to 48 hours pending clinical course -Speech evaluation pending for possible aspiration risk, as noted above patient is on chopped/pured diet at baseline  Mild AKI on CKD 3A -Patient already received IV fluids in the ED, will advance diet as tolerated -Continue IV fluids in the interim  Hypertension, uncontrolled secondary to noncompliance  -Resume home amlodipine, has not had this medication in 3 days -IV hydralazine x1 in the interim given blood pressure 190/100 at interview  ALS, at baseline History of CVA -continue statin Pacemaker placement Hyperlipidemia -continue statin Seizure -continue Keppra  DVT prophylaxis: Heparin subcu Code Status: DNR Family Communication: Wife at bedside Status is: Observation  Dispo: The patient is from: Home              Anticipated d/c is to: Home              Anticipated d/c date is: 24h              Patient currently NOT medically stable for discharge given ongoing need for monitoring/treatment  Consultants:  None  Procedures:  None   Past Medical History:  Diagnosis Date   Arthritis    At high risk for falls 08/16/2015   Bradycardia    Chronic kidney disease    stage 3 GFR 30-59 ml/min    CIDP (chronic inflammatory demyelinating polyneuropathy) ([name])    CKD (chronic kidney disease) stage 3, GFR 30-59 ml/min ([place]) 08/16/2015   Dysphagia as late effect of cerebrovascular disease    pts wife states pt has to eat soft foods    Elevated liver enzymes 08/10/2016   GERD (gastroesophageal reflux disease)    Glaucoma    High cholesterol    History of  CVA with residual deficit 03/25/2013   Hypertension    Hypertensive retinopathy of both eyes 01/16/2017   Inguinal hernia 03/25/2013   Liver hemangioma 08/14/2016   Neuromuscular disorder ([place])    chronic inflammatory demyelinating polyneuropathy    New onset seizure ([place]) 07/08/2017   seizure 07/14/18   Nuclear sclerosis of both eyes  10/25/2016   Presence of permanent cardiac pacemaker    placed in april 2018   Primary open angle glaucoma of both eyes, indeterminate stage 10/25/2016   Renal mass, right 08/14/2016   Status cardiac pacemaker 01/29/2017   Placed for second degree heart block on 01/16/17 Medtronic Azure XT DR MRI SureScan dual-chamber pacemaker   Stroke [facility])    2011 with residual deficit left sided weakness   Tobacco dependence     Past Surgical History:  Procedure Laterality Date   EYE SURGERY     HERNIA REPAIR     INGUINAL HERNIA REPAIR Right 11/23/2014   Procedure: right inguinal hernia repair with mesh;  Surgeon: Armandina Gemma, MD;  Location: [facility];  Service: General;  Laterality: Right;   INSERTION OF MESH N/A 11/23/2014   Procedure: INSERTION OF MESH;  Surgeon: Armandina Gemma, MD;  Location: [facility];  Service: General;  Laterality: N/A;   MASS EXCISION Left 08/29/2017   Procedure: EXCISION OF LEFT NECK MASS;  Surgeon: Coralie Keens, MD;  Location: [place];  Service: General;  Laterality: Left;   PACEMAKER IMPLANT N/A 01/16/2017   Procedure: Pacemaker Implant;  Surgeon: Will Meredith Leeds, MD;  Location: [place] [facility];  Service: Cardiovascular;  Laterality: N/A;   SHOULDER SURGERY Bilateral 1988, 1998     reports that he has been smoking cigarettes. He has a 40.00 pack-year smoking history. He has never used smokeless tobacco. He reports that he does not drink alcohol and does not use drugs.  Allergies  Allergen Reactions   Ace Inhibitors Other (See Comments)    Hyperkalemia   Doxycycline Other (See Comments)    Hiccups, cough, nausea and emesis, elevated liver enzymes, elevated eosinophils, SOB concerning for early DRESS syndrome    At[place]and Hct [Candesartan Cilexetil-Hctz] Hives   Shellfish Allergy Hives    Family History  Problem Relation Age of Onset   Hypertension Mother    Diabetes Sister    Hypertension Sister    Cancer Sister        1 sister   COPD Sister        in 1 sister    Stomach cancer Neg Hx    Colon cancer Neg Hx    Pancreatic cancer Neg Hx    Esophageal cancer Neg Hx     Prior to Admission medications   Medication Sig Start Date End Date Taking? Authorizing Provider  albuterol (VENTOLIN HFA) 108 (90 Base) MCG/ACT inhaler Inhale 2 puffs into the lungs every 6 (six) hours as needed for up to 30 days for Wheezing. Patient taking differently: Inhale 2 puffs into the lungs as needed for wheezing or shortness of breath. 06/08/21  Yes Freeman Caldron M, PA-C  amLODipine (NORVASC) 10 MG tablet Take 1 tablet by mouth once daily Patient taking differently: Take 10 mg by mouth daily. 03/23/22  Yes [name], MD  atorvastatin (LIPITOR) 40 MG tablet Take 1 tablet (40 mg total) by mouth daily. 10/27/21  Yes [name], MD  azithromycin (ZITHROMAX) 500 MG tablet Take 500 mg by mouth See admin instructions. Take 1 tablet by mouth Monday,Wednesday and Friday   Yes [provider]  brimonidine (ALPHAGAN) 0.2 % ophthalmic solution Place 1 drop into both eyes 3 (three) times daily. 07/25/21  Yes [provider]  Budeson-Glycopyrrol-Formoterol (BREZTRI AEROSPHERE) 160-9-4.8 MCG/ACT AERO Inhale 2 puffs into the lungs 2 (two) times daily. Patient taking differently: Inhale 2 puffs into the lungs daily as needed (SOB, wheezing). 08/01/21  Yes [name], MD  cetirizine (ZYRTEC) 10 MG tablet Take 1 tablet (10 mg total) by mouth daily. 01/24/22  Yes [name], MD  ferrous sulfate (FEROSUL) 325 (65 FE) MG tablet Take 1 tablet (325 mg total) by mouth 2 (two) times daily with a meal. 06/08/21  Yes McClung, Angela M, PA-C  finasteride (PROSCAR) 5 MG tablet Take 5 mg by mouth daily. 12/13/21  Yes [provider]  fluticasone (FLONASE) 50 MCG/ACT nasal spray Place 2 sprays into both nostrils daily. Patient taking differently: Place 2 sprays into both nostrils daily as needed for allergies. 01/12/21  Yes Argentina Donovan, PA-C  glycopyrrolate  (ROBINUL) 1 MG tablet Take 1 tablet (1 mg total) by mouth 2 times daily. 10/27/21  Yes [name], MD  hydrALAZINE (APRESOLINE) 10 MG tablet Take 1 tablet (10 mg total) by mouth 3 (three) times daily. 01/24/22  Yes Newlin, Enobong, MD  latanoprost (XALATAN) 0.005 % ophthalmic solution Place 1 drop into both eyes at bedtime. 07/25/21  Yes [provider]  levETIRAcetam (KEPPRA) 500 MG tablet Take 1 tablet (500 mg total) by mouth 2 (two) times daily. 12/19/21  Yes Penumalli, Vikram R, MD  lidocaine (LIDODERM) 5 % Apply patch to painful area. Patch may remain in place for up to 12 hours in a 24 hour period. Patient taking differently: Place 1 patch onto the skin daily as needed (for pain). 06/08/21  Yes McClung, Angela M, PA-C  montelukast (SINGULAIR) 10 MG tablet Take 10 mg by mouth at bedtime. 08/29/21  Yes [provider]  Multiple Vitamin (MULTI-VITAMIN) tablet Take 1 tablet by mouth daily.   Yes [provider]  polyethylene glycol (MIRALAX) 17 g packet Take 17 g by mouth daily. Patient taking differently: Take 17 g by mouth daily as needed. 10/11/21  Yes Shelly Coss, MD  dicyclomine (BENTYL) 10 MG capsule Take 1 capsule (10 mg total) by mouth daily as needed. Patient not taking: Reported on 03/23/2022 01/24/22   [name], MD  dorzolamide-timolol (COSOPT) 22.3-6.8 MG/ML ophthalmic solution Place 1 drop into both eyes 2 (two) times daily.    [provider]  guaiFENesin-dextromethorphan (ROBITUSSIN DM) 100-10 MG/5ML syrup Take 5 mLs by mouth every 4 (four) hours as needed for cough. Patient not taking: Reported on 03/23/2022 05/07/21   Shelly Coss, MD  Misc. Devices MISC Yankauer suction. Dx: ALS 11/11/19   [name], MD  Misc. Devices MISC Coughalator 04/01/20   [name], MD  Misc. Devices MISC Nebulizer machine.  Diagnosis- Motor neuron disease 04/01/20   [name], MD  Misc. [facility] Bed OPF29.24 06/01/20   [name], MD  Misc. [facility] bed extension. Diagnosis: ALS. Height 6'4" 06/10/20   [name], MD  Misc. Devices MISC Manual wheelchair.  Diagnosis-ALS 05/04/21   [name], MD  Misc. Devices MISC Right and left wrist splint 12/05/21   [name], MD  pantoprazole (PROTONIX) 40 MG tablet Take 1 tablet (40 mg total) by mouth 2 (two) times daily. Patient not taking: Reported on 03/23/2022 10/11/21   Shelly Coss, MD  senna (SENOKOT) 8.6 MG TABS tablet Take 1 tablet (  8.6 mg total) by mouth daily. Patient not taking: Reported on 03/23/2022 10/11/21   Shelly Coss, MD  tamsulosin (FLOMAX) 0.4 MG CAPS capsule Take 1 capsule (0.4 mg total) by mouth daily. 01/24/22   [name], MD    Physical Exam: Vitals:   03/23/22 1445 03/23/22 1500 03/23/22 1530 03/23/22 1616  BP: (!) 189/109 (!) 190/109 (!) 179/115 (!) 167/101  Pulse: (!) 101 97 (!) 108   Resp: (!) '21 15 19   '$ Temp:      TempSrc:      SpO2: 95% 96% 94%     Constitutional: NAD, calm, comfortable Vitals:   03/23/22 1445 03/23/22 1500 03/23/22 1530 03/23/22 1616  BP: (!) 189/109 (!) 190/109 (!) 179/115 (!) 167/101  Pulse: (!) 101 97 (!) 108   Resp: (!) '21 15 19   '$ Temp:      TempSrc:      SpO2: 95% 96% 94%    General:  Pleasantly resting in bed, No acute distress. HEENT:  Normocephalic atraumatic.  Sclerae nonicteric, noninjected.  Extraocular movements intact bilaterally. Neck:  Without mass or deformity.  Trachea is midline. Lungs:  Course breath sounds bilaterally. Heart:  Regular rate and rhythm.  Without murmurs, rubs, or gallops. Abdomen:  Soft, nontender, nondistended.  Without guarding or rebound  Labs on Admission: I have personally reviewed following labs and imaging studies  CBC: Recent Labs  Lab 03/23/22 1334  WBC 14.8*  HGB 12.4*  HCT 38.2*  MCV 89.5  PLT 950   Basic Metabolic Panel: Recent Labs  Lab 03/23/22 1334  NA 141  K 5.0  CL 108  CO2 24  GLUCOSE 144*  BUN 43*   CREATININE 2.93*  CALCIUM 10.0   GFR: CrCl cannot be calculated (Unknown ideal weight.). Liver Function Tests: Recent Labs  Lab 03/23/22 1334  AST 14*  ALT 12  ALKPHOS 99  BILITOT 0.8  PROT 6.6  ALBUMIN 3.6   No results for input(s): "LIPASE", "AMYLASE" in the last 168 hours. No results for input(s): "AMMONIA" in the last 168 hours. Coagulation Profile: No results for input(s): "INR", "PROTIME" in the last 168 hours. Cardiac Enzymes: No results for input(s): "CKTOTAL", "CKMB", "CKMBINDEX", "TROPONINI" in the last 168 hours. BNP (last 3 results) No results for input(s): "PROBNP" in the last 8760 hours. HbA1C: No results for input(s): "HGBA1C" in the last 72 hours. CBG: No results for input(s): "GLUCAP" in the last 168 hours. Lipid Profile: No results for input(s): "CHOL", "HDL", "LDLCALC", "TRIG", "CHOLHDL", "LDLDIRECT" in the last 72 hours. Thyroid Function Tests: No results for input(s): "TSH", "T4TOTAL", "FREET4", "T3FREE", "THYROIDAB" in the last 72 hours. Anemia Panel: No results for input(s): "VITAMINB12", "FOLATE", "FERRITIN", "TIBC", "IRON", "RETICCTPCT" in the last 72 hours. Urine analysis:    Component Value Date/Time   COLORURINE YELLOW 10/10/2021 1125   APPEARANCEUR CLEAR 10/10/2021 1125   LABSPEC 1.010 10/10/2021 1125   PHURINE 6.0 10/10/2021 1125   GLUCOSEU NEGATIVE 10/10/2021 1125   HGBUR LARGE (A) 10/10/2021 1125   BILIRUBINUR NEGATIVE 10/10/2021 1125   BILIRUBINUR negative 09/07/2017 1645   KETONESUR NEGATIVE 10/10/2021 1125   PROTEINUR NEGATIVE 10/10/2021 1125   UROBILINOGEN 0.2 09/07/2017 1645   UROBILINOGEN 4.0 (H) 08/04/2016 1517   NITRITE NEGATIVE 10/10/2021 1125   LEUKOCYTESUR SMALL (A) 10/10/2021 1125    Radiological Exams on Admission: DG Chest 2 View  Result Date: 03/23/2022 CLINICAL DATA:  Shortness of breath. History of ALS. Congestion. Fever. EXAM: CHEST - 2 VIEW COMPARISON:  10/08/2021 FINDINGS: Pacemaker  as seen previously. Heart  size is normal. Chronic aortic atherosclerosis. Left lower lobe infiltrate/volume loss consistent with bronchopneumonia. The remainder the chest is clear. IMPRESSION: Left lower lobe infiltrate/volume loss consistent with bronchopneumonia. Electronically Signed   By: Nelson Chimes M.D.   On: 03/23/2022 13:31    EKG: Independently reviewed.  Little Ishikawa DO [facility] For contact please use secure messenger on Epic  If 7PM-7AM, please contact night-coverage located on [email or web address]   03/23/2022, 6:39 PM

## 2022-03-24 ENCOUNTER — Ambulatory Visit: Payer: Medicare HMO | Admitting: Podiatry

## 2022-03-24 ENCOUNTER — Observation Stay (HOSPITAL_COMMUNITY): Payer: Medicare HMO

## 2022-03-24 DIAGNOSIS — J159 Unspecified bacterial pneumonia: Secondary | ICD-10-CM | POA: Diagnosis not present

## 2022-03-24 LAB — CBC
HCT: 29.8 % — ABNORMAL LOW (ref 39.0–52.0)
Hemoglobin: 9.7 g/dL — ABNORMAL LOW (ref 13.0–17.0)
MCH: 29 pg (ref 26.0–34.0)
MCHC: 32.6 g/dL (ref 30.0–36.0)
MCV: 89 fL (ref 80.0–100.0)
Platelets: 190 10*3/uL (ref 150–400)
RBC: 3.35 MIL/uL — ABNORMAL LOW (ref 4.22–5.81)
RDW: 14.2 % (ref 11.5–15.5)
WBC: 13.7 10*3/uL — ABNORMAL HIGH (ref 4.0–10.5)
nRBC: 0 % (ref 0.0–0.2)

## 2022-03-24 LAB — BASIC METABOLIC PANEL
Anion gap: 5 (ref 5–15)
BUN: 45 mg/dL — ABNORMAL HIGH (ref 8–23)
CO2: 19 mmol/L — ABNORMAL LOW (ref 22–32)
Calcium: 9.4 mg/dL (ref 8.9–10.3)
Chloride: 116 mmol/L — ABNORMAL HIGH (ref 98–111)
Creatinine, Ser: 2.74 mg/dL — ABNORMAL HIGH (ref 0.61–1.24)
GFR, Estimated: 24 mL/min — ABNORMAL LOW (ref >60)
Glucose, Bld: 126 mg/dL — ABNORMAL HIGH (ref 70–99)
Potassium: 5.3 mmol/L — ABNORMAL HIGH (ref 3.5–5.1)
Sodium: 140 mmol/L (ref 135–145)

## 2022-03-24 MED ORDER — CEFDINIR 300 MG PO CAPS: 300.0000 mg | ORAL_CAPSULE | Freq: Every day | ORAL | Status: DC

## 2022-03-24 MED ORDER — AZITHROMYCIN 500 MG PO TABS: 500.0000 mg | ORAL_TABLET | Freq: Every day | ORAL | 0 refills | Status: DC

## 2022-03-24 MED ORDER — AZITHROMYCIN 500 MG PO TABS: 500.0000 mg | ORAL_TABLET | ORAL | 2 refills | Status: DC

## 2022-03-24 MED ORDER — SODIUM CHLORIDE 0.9 % IV SOLN: INTRAVENOUS | Status: DC

## 2022-03-24 MED ORDER — AZITHROMYCIN 500 MG PO TABS
500.0000 mg | ORAL_TABLET | Freq: Every day | ORAL | Status: DC
  Administered 2022-03-24: 500 mg via ORAL
  Filled 2022-03-24: qty 1

## 2022-03-24 MED ORDER — CEFDINIR 300 MG PO CAPS: 300.0000 mg | ORAL_CAPSULE | Freq: Every day | ORAL | 0 refills | Status: DC

## 2022-03-24 NOTE — Progress Notes (Signed)
Modified Barium Swallow Progress Note  Patient Details  Name: Darrell [name] MRN: [id number] Date of Birth: 1951-06-29  Today's Date: 03/24/2022  Modified Barium Swallow completed.  Full report located under Chart Review in the Imaging Section.  Brief recommendations include the following:  Clinical Impression  Pt demonstrated moderate pharyngeal dysphagia with aspiration before and during the swallow. Timing of laryngeal protection was late with thin and nectar aspirating before swallow was initiated. Response was inconsistent with weak cough and throat clear which did not clear aspirates. Presently he is not capable of performing consistent compensatory strategy. Decreased pharyngeal wall contraction led to pyriform sinus residue, mild. Honey thick moved slower through pharynx allowing more time for laryngeal closure that spilled over epiglottis but was not penetrated using straw for several trials. There was one instance of penetration before the swallow to the cords that was ejected during second swallow with honey thick barium. Pt typically eats without dentures (not present) however eats softer foods and did not think he could masticate the graham cracker. Recommend puree (Dys 1), honey thick liquids, pills crushed, upright posture. ST to follow.   Swallow Evaluation Recommendations       SLP Diet Recommendations: Dysphagia 1 (Puree) solids;Honey thick liquids   Liquid Administration via: Straw;Cup   Medication Administration: Crushed with puree   Supervision: Full assist for feeding   Compensations: Slow rate;Small sips/bites;Minimize environmental distractions;Clear throat intermittently   Postural Changes: Seated upright at 90 degrees   Oral Care Recommendations: Oral care BID        Houston Siren 03/24/2022,4:22 PM

## 2022-03-24 NOTE — Discharge Summary (Signed)
Physician Discharge Summary  Darrell [name] TMH:[id number] DOB: 1951/07/10 DOA: 03/23/2022  PCP: [name], MD  Admit date: 03/23/2022 Discharge date: 03/24/2022  Admitted From: Home Disposition:  Home  Recommendations for Outpatient Follow-up:  Follow up with PCP in 1-2 weeks  Home Health: PT  Equipment/Devices:Hoyer lift  Discharge Condition:Stable  CODE STATUS:DNR  Diet recommendation:     Diet recommendations: Dysphagia 1 (puree);Honey-thick liquid Liquids provided via: Cup;Straw Medication Administration: Crushed with puree Supervision: Staff to assist with self feeding;Full supervision/cueing for compensatory strategies Compensations: Slow rate;Small sips/bites;Minimize environmental distractions;Clear throat intermittently Postural Changes and/or Swallow Maneuvers: Seated upright 90 degrees                        Oral Care Recommendations: Oral care BID Follow Up Recommendations: Home health SLP Assistance recommended at discharge: Frequent or constant Supervision/Assistance SLP Visit Diagnosis: Dysphagia, pharyngeal phase (R13.13) Plan: Continue with current plan of care   Brief/Interim Summary: Darrell [name] is a 71 y.o. male with medical history significant of advanced ALS currently bedbound with history of seizure, CVA, pacemaker placement, hypertension, hyperlipidemia, CKD 3a and tobacco abuse.  Patient presents with family at bedside for altered mental status, not as "sharp or responsive" as he usually is.  Patient's family also indicating there has been some coughing more than usual.  Patient brought to the ED for further evaluation and treatment.  He was not previously evaluated by PCP urgent care, no recent travel, antibiotics, sick contacts or other changes in medications or lifestyle.   In the ED patient was noted to be septic given tachycardia, leukocytosis with chest x-ray concerning for possible left-sided pneumonia.  Family indicates he is typically on  a chopped or pured diet at home with no changes recently.  Patient's COVID swab is negative. Otherwise mild elevation of creatinine from baseline CKD 3A labs.  Patient's symptoms resolved over the past 24h - transition to PO antibiotics to complete therapy course. Speech evaluation with new recommendations as above. DC with hoyer lift and continue PT per recs here. Otherwise stable, patient/wife/daughter ok for DC back home.  Discharge Diagnoses:  Principal Problem:   Community acquired bacterial pneumonia   Discharge Instructions     Discharge patient   Complete by: As directed    Discharge disposition: 06-Home-Health Care Svc   Discharge patient date: 03/24/2022      Allergies as of 03/24/2022       Reactions   Ace Inhibitors Other (See Comments)   Hyperkalemia   Doxycycline Other (See Comments)   Hiccups, cough, nausea and emesis, elevated liver enzymes, elevated eosinophils, SOB concerning for early DRESS syndrome    Atacand Hct [candesartan Cilexetil-hctz] Hives   Shellfish Allergy Hives        Medication List     STOP taking these medications    guaiFENesin-dextromethorphan 100-10 MG/5ML syrup Commonly known as: ROBITUSSIN DM       TAKE these medications    albuterol 108 (90 Base) MCG/ACT inhaler Commonly known as: VENTOLIN HFA Inhale 2 puffs into the lungs every 6 (six) hours as needed for up to 30 days for Wheezing. What changed:  how much to take when to take this reasons to take this   amLODipine 10 MG tablet Commonly known as: NORVASC Take 1 tablet by mouth once daily   atorvastatin 40 MG tablet Commonly known as: LIPITOR Take 1 tablet (40 mg total) by mouth daily.   azithromycin 500 MG tablet Commonly known  as: ZITHROMAX Take 1 tablet (500 mg total) by mouth daily for 3 days. Start taking on: March 25, 2022 What changed: You were already taking a medication with the same name, and this prescription was added. Make sure you understand how and  when to take each.   azithromycin 500 MG tablet Commonly known as: ZITHROMAX Take 1 tablet (500 mg total) by mouth See admin instructions. Take 1 tablet by mouth Monday,Wednesday and Friday Start taking on: March 29, 2022 What changed: These instructions start on March 29, 2022. If you are unsure what to do until then, ask your doctor or other care provider.   Breztri Aerosphere 160-9-4.8 MCG/ACT Aero Generic drug: Budeson-Glycopyrrol-Formoterol Inhale 2 puffs into the lungs 2 (two) times daily. What changed:  when to take this reasons to take this   brimonidine 0.2 % ophthalmic solution Commonly known as: ALPHAGAN Place 1 drop into both eyes 3 (three) times daily.   cefdinir 300 MG capsule Commonly known as: OMNICEF Take 1 capsule (300 mg total) by mouth daily. Start taking on: March 25, 2022   cetirizine 10 MG tablet Commonly known as: ZYRTEC Take 1 tablet (10 mg total) by mouth daily.   dorzolamide-timolol 22.3-6.8 MG/ML ophthalmic solution Commonly known as: COSOPT Place 1 drop into both eyes 2 (two) times daily.   ferrous sulfate 325 (65 FE) MG tablet Commonly known as: FeroSul Take 1 tablet (325 mg total) by mouth 2 (two) times daily with a meal.   finasteride 5 MG tablet Commonly known as: PROSCAR Take 5 mg by mouth daily.   fluticasone 50 MCG/ACT nasal spray Commonly known as: FLONASE Place 2 sprays into both nostrils daily. What changed:  when to take this reasons to take this   glycopyrrolate 1 MG tablet Commonly known as: ROBINUL Take 1 tablet (1 mg total) by mouth 2 times daily.   hydrALAZINE 10 MG tablet Commonly known as: APRESOLINE Take 1 tablet (10 mg total) by mouth 3 (three) times daily.   latanoprost 0.005 % ophthalmic solution Commonly known as: XALATAN Place 1 drop into both eyes at bedtime.   levETIRAcetam 500 MG tablet Commonly known as: KEPPRA Take 1 tablet (500 mg total) by mouth 2 (two) times daily.   lidocaine 5 % Commonly known as:  LIDODERM Apply patch to painful area. Patch may remain in place for up to 12 hours in a 24 hour period. What changed:  how much to take when to take this reasons to take this   Misc. Devices Misc Yankauer suction. Dx: ALS   Misc. Devices Misc Coughalator   Misc. Devices Misc Nebulizer machine.  Diagnosis- Motor neuron disease   Misc. [facility] Bed DXg12.20   [place] bed extension. Diagnosis: ALS. Height 6'4"   Misc. Devices Misc Manual wheelchair.  Diagnosis-ALS   Misc. Devices Misc Right and left wrist splint   montelukast 10 MG tablet Commonly known as: SINGULAIR Take 10 mg by mouth at bedtime.   Multi-Vitamin tablet Take 1 tablet by mouth daily.   polyethylene glycol 17 g packet Commonly known as: MiraLax Take 17 g by mouth daily. What changed:  when to take this reasons to take this   tamsulosin 0.4 MG Caps capsule Commonly known as: FLOMAX Take 1 capsule (0.4 mg total) by mouth daily.               Durable Medical Equipment  (From admission, onward)  Start     Ordered   03/24/22 1414  For home use only DME Hospital bed  Once       Question Answer Comment  Length of Need Lifetime   Patient has (list medical condition): ALS   The above medical condition requires: Patient requires the ability to reposition frequently   Head must be elevated greater than: 30 degrees   Bed type Semi-electric   Hoyer Lift Yes   Support Surface: Gel Overlay      03/24/22 1414            Follow-up Information     [facility] [place] Follow up.   Specialty: Home Health Services Why: For PT Contact information: [address] [place] [address] [place] [facility] Follow up.   Why: Medical equipment Contact information: [address] [name] [address] [phone]                Allergies  Allergen Reactions   Ace  Inhibitors Other (See Comments)    Hyperkalemia   Doxycycline Other (See Comments)    Hiccups, cough, nausea and emesis, elevated liver enzymes, elevated eosinophils, SOB concerning for early DRESS syndrome    Atacand Hct [Candesartan Cilexetil-Hctz] Hives   Shellfish Allergy Hives    Consultations: Speech   Procedures/Studies: DG Swallowing Func-Speech Pathology  Result Date: 03/24/2022 Table formatting from the original result was not included. Objective Swallowing Evaluation: Type of Study: MBS-Modified Barium Swallow Study  Patient Details Name: Darrell [name] MRN: [id number] Date of Birth: August 02, 1951 Today's Date: 03/24/2022 Time: SLP Start Time (ACUTE ONLY): 3149 -SLP Stop Time (ACUTE ONLY): 7026 SLP Time Calculation (min) (ACUTE ONLY): 15 min Past Medical History: Past Medical History: Diagnosis Date  Arthritis   At high risk for falls 08/16/2015  Bradycardia   Chronic kidney disease   stage 3 GFR 30-59 ml/min   CIDP (chronic inflammatory demyelinating polyneuropathy) ([name])   CKD (chronic kidney disease) stage 3, GFR 30-59 ml/min ([place]) 08/16/2015  Dysphagia as late effect of cerebrovascular disease   pts wife states pt has to eat soft foods   Elevated liver enzymes 08/10/2016  GERD (gastroesophageal reflux disease)   Glaucoma   High cholesterol   History of CVA with residual deficit 03/25/2013  Hypertension   Hypertensive retinopathy of both eyes 01/16/2017  Inguinal hernia 03/25/2013  Liver hemangioma 08/14/2016  Neuromuscular disorder ([facility])   chronic inflammatory demyelinating polyneuropathy   New onset seizure ([facility]) 07/08/2017  seizure 07/14/18  Nuclear sclerosis of both eyes 10/25/2016  Presence of permanent cardiac pacemaker   placed in april 2018  Primary open angle glaucoma of both eyes, indeterminate stage 10/25/2016  Renal mass, right 08/14/2016  Status cardiac pacemaker 01/29/2017  Placed for second degree heart block on 01/16/17 Medtronic Azure XT DR MRI SureScan dual-chamber pacemaker  Stroke  [facility])   2011 with residual deficit left sided weakness  Tobacco dependence  Past Surgical History: Past Surgical History: Procedure Laterality Date  EYE SURGERY    HERNIA REPAIR    INGUINAL HERNIA REPAIR Right 11/23/2014  Procedure: right inguinal hernia repair with mesh;  Surgeon: Armandina Gemma, MD;  Location: [facility];  Service: General;  Laterality: Right;  INSERTION OF MESH N/A 11/23/2014  Procedure: INSERTION OF MESH;  Surgeon: Armandina Gemma, MD;  Location: [facility];  Service: General;  Laterality: N/A;  MASS EXCISION Left 08/29/2017  Procedure: EXCISION OF LEFT NECK MASS;  Surgeon: Coralie Keens, MD;  Location: [facility];  Service: General;  Laterality: Left;  PACEMAKER IMPLANT N/A 01/16/2017  Procedure: Pacemaker Implant;  Surgeon: Will Meredith Leeds, MD;  Location: [facility];  Service: Cardiovascular;  Laterality: N/A;  SHOULDER SURGERY Bilateral 1988, 1998 HPI: Darrell [name] is a 71 y.o. male with medical history significant of advanced ALS currently bedbound with history of seizure, CVA, pacemaker placement, hypertension, hyperlipidemia, CKD 3a and tobacco abuse.  Patient presents with  altered mental status and some coughing more than usual. Chest x-ray concerning for possible left-sided pneumonia. Family indicates he is typically on a mostly chopped. MBS 09/2021 delay to valleculae, frank silent aspiration with nectar. Intermittent backflow into pharynx from esohpagus. Recommend honey thick and puree. MBS in 08/20 recommending dysphagia 1 solids and thin liquids with meds whole in puree.                .  No data recorded  Recommendations for follow up therapy are one component of a multi-disciplinary discharge planning process, led by the attending physician.  Recommendations may be updated based on patient status, additional functional criteria and insurance authorization. Assessment / Plan / Recommendation   03/24/2022   3:53 PM Clinical Impressions Clinical Impression Pt demonstrated moderate pharyngeal  dysphagia with aspiration before and during the swallow. Timing of laryngeal protection was late with thin and nectar aspirating before swallow was initiated. Response was inconsistent with weak cough and throat clear which did not clear aspirates. Presently he is not capable of performing consistent compensatory strategy. Decreased pharyngeal wall contraction led to pyriform sinus residue, mild. Honey thick moved slower through pharynx allowing more time for laryngeal closure that spilled over epiglottis but was not penetrated using straw for several trials. There was one instance of penetration before the swallow to the cords that was ejected during second swallow with honey thick barium. Pt typically eats without dentures (not present) however eats softer foods and did not think he could masticate the graham cracker. Recommend puree (Dys 1), honey thick liquids, pills crushed, upright posture. ST to follow. SLP Visit Diagnosis Dysphagia, pharyngeal phase (R13.13) Impact on safety and function Moderate aspiration risk;Severe aspiration risk     03/24/2022   3:53 PM Treatment Recommendations Treatment Recommendations Therapy as outlined in treatment plan below     03/24/2022   3:53 PM Prognosis Prognosis for Safe Diet Advancement Fair Barriers to Reach Goals Cognitive deficits   03/24/2022   3:53 PM Diet Recommendations SLP Diet Recommendations Dysphagia 1 (Puree) solids;Honey thick liquids Liquid Administration via Straw;Cup Medication Administration Crushed with puree Compensations Slow rate;Small sips/bites;Minimize environmental distractions;Clear throat intermittently Postural Changes Seated upright at 90 degrees     03/24/2022   3:53 PM Other Recommendations Oral Care Recommendations Oral care BID Follow Up Recommendations Home health SLP Assistance recommended at discharge Frequent or constant Supervision/Assistance Functional Status Assessment Patient has had a recent decline in their functional status and  demonstrates the ability to make significant improvements in function in a reasonable and predictable amount of time.   03/24/2022   3:53 PM Frequency and Duration  Speech Therapy Frequency (ACUTE ONLY) min 2x/week Treatment Duration 2 weeks     03/24/2022   3:53 PM Oral Phase Oral Phase [facility]    03/24/2022   3:53 PM Pharyngeal Phase Pharyngeal Phase Impaired Pharyngeal- Honey Cup Pharyngeal residue - valleculae;Delayed swallow initiation-vallecula Pharyngeal- Nectar Cup Penetration/Aspiration before swallow Pharyngeal Material enters airway, passes BELOW cords and not ejected out despite  cough attempt by patient Pharyngeal- Nectar Straw Pharyngeal residue - valleculae;Pharyngeal residue - pyriform;Penetration/Aspiration during swallow Pharyngeal Material enters airway, passes BELOW cords and not ejected out despite cough attempt by patient;Material enters airway, CONTACTS cords and not ejected out Pharyngeal- Thin Straw Delayed swallow initiation-pyriform sinuses;Penetration/Aspiration during swallow;Penetration/Aspiration before swallow Pharyngeal Material enters airway, remains ABOVE vocal cords and not ejected out;Material enters airway, passes BELOW cords without attempt by patient to eject out (silent aspiration) Pharyngeal- Puree [facility]    03/24/2022   3:53 PM Cervical Esophageal Phase  Cervical Esophageal Phase Darryll Capers [place] Siren 03/24/2022, 4:21 PM                     DG Chest 2 View  Result Date: 03/23/2022 CLINICAL DATA:  Shortness of breath. History of ALS. Congestion. Fever. EXAM: CHEST - 2 VIEW COMPARISON:  10/08/2021 FINDINGS: Pacemaker as seen previously. Heart size is normal. Chronic aortic atherosclerosis. Left lower lobe infiltrate/volume loss consistent with bronchopneumonia. The remainder the chest is clear. IMPRESSION: Left lower lobe infiltrate/volume loss consistent with bronchopneumonia. Electronically Signed   By: Nelson Chimes M.D.   On: 03/23/2022 13:31   CUP PACEART REMOTE DEVICE  CHECK  Result Date: 03/21/2022 Scheduled remote reviewed. Normal device function.  2 NSVT episodes logged, actually just 1 episode occurring 01/01/22 @ 2322 lasting 24 beats. Routing for further review per protocol. Next remote 91 days- JJB    Subjective: No acute issues/events overnight   Discharge Exam: Vitals:   03/24/22 1200 03/24/22 1600  BP: (!) 141/71 (!) 157/69  Pulse: 69 70  Resp: 13 13  Temp: 98.3 F (36.8 C)   SpO2: 97% 95%   Vitals:   03/24/22 0400 03/24/22 0800 03/24/22 1200 03/24/22 1600  BP: 128/76 (!) 144/77 (!) 141/71 (!) 157/69  Pulse: 77 72 69 70  Resp: '18 15 13 13  '$ Temp:  98.5 F (36.9 C) 98.3 F (36.8 C)   TempSrc:  Oral    SpO2: 94% 96% 97% 95%  Weight:      Height:        General: Pt is alert, awake, not in acute distress Cardiovascular: RRR, S1/S2 +, no rubs, no gallops Respiratory: CTA bilaterally, no wheezing, no rhonchi Abdominal: Soft, NT, ND, bowel sounds + Extremities: no edema, no cyanosis    The results of significant diagnostics from this hospitalization (including imaging, microbiology, ancillary and laboratory) are listed below for reference.     Microbiology: Recent Results (from the past 240 hour(s))  SARS Coronavirus 2 by RT PCR (hospital order, performed in [facility] hospital lab) *cepheid single result test* Anterior Nasal Swab     Status: None   Collection Time: 03/23/22 12:55 PM   Specimen: Anterior Nasal Swab  Result Value Ref Range Status   SARS Coronavirus 2 by RT PCR NEGATIVE NEGATIVE Final    Comment: (NOTE) SARS-CoV-2 target nucleic acids are NOT DETECTED.  The SARS-CoV-2 RNA is generally detectable in upper and lower respiratory specimens during the acute phase of infection. The lowest concentration of SARS-CoV-2 viral copies this assay can detect is 250 copies / mL. A negative result does not preclude SARS-CoV-2 infection and should not be used as the sole basis for treatment or other patient management  decisions.  A negative result may occur with improper specimen collection / handling, submission of specimen other than nasopharyngeal swab, presence of viral mutation(s) within the areas targeted by this assay, and inadequate number of viral copies (<250 copies / mL).  A negative result must be combined with clinical observations, patient history, and epidemiological information.  Fact Sheet for Patients:   https://www.patel.info/  Fact Sheet for Healthcare Providers: https://hall.com/  This test is not yet approved or  cleared by the [place] FDA and has been authorized for detection and/or diagnosis of SARS-CoV-2 by FDA under an Emergency Use Authorization (EUA).  This EUA will remain in effect (meaning this test can be used) for the duration of the COVID-19 declaration under Section 564(b)(1) of the Act, 21 U.S.C. section 360bbb-3(b)(1), unless the authorization is terminated or revoked sooner.  Performed at [facility], [place] [address] [place], [place] [address]   Blood culture (routine x 2)     Status: None (Preliminary result)   Collection Time: 03/23/22  1:55 PM   Specimen: BLOOD  Result Value Ref Range Status   Specimen Description BLOOD RIGHT ANTECUBITAL  Final   Special Requests   Final    BOTTLES DRAWN AEROBIC AND ANAEROBIC Blood Culture results may not be optimal due to an inadequate volume of blood received in culture bottles   Culture   Final    NO GROWTH < 24 HOURS Performed at [facility], [place] [address] [place], [place] [address]    Report Status PENDING  Incomplete  Blood culture (routine x 2)     Status: None (Preliminary result)   Collection Time: 03/23/22  1:59 PM   Specimen: BLOOD  Result Value Ref Range Status   Specimen Description BLOOD BLOOD RIGHT FOREARM  Final   Special Requests   Final    BOTTLES DRAWN AEROBIC AND ANAEROBIC Blood Culture results may not be optimal due to an inadequate  volume of blood received in culture bottles   Culture   Final    NO GROWTH < 24 HOURS Performed at [facility], [place] [address], [place], [place] [address]    Report Status PENDING  Incomplete     Labs: BNP (last 3 results) Recent Labs    04/13/21 2325  BNP 756.4*   Basic Metabolic Panel: Recent Labs  Lab 03/23/22 1334 03/24/22 0053  NA 141 140  K 5.0 5.3*  CL 108 116*  CO2 24 19*  GLUCOSE 144* 126*  BUN 43* 45*  CREATININE 2.93* 2.74*  CALCIUM 10.0 9.4   Liver Function Tests: Recent Labs  Lab 03/23/22 1334  AST 14*  ALT 12  ALKPHOS 99  BILITOT 0.8  PROT 6.6  ALBUMIN 3.6   No results for input(s): "LIPASE", "AMYLASE" in the last 168 hours. No results for input(s): "AMMONIA" in the last 168 hours. CBC: Recent Labs  Lab 03/23/22 1334 03/24/22 0053  WBC 14.8* 13.7*  HGB 12.4* 9.7*  HCT 38.2* 29.8*  MCV 89.5 89.0  PLT 215 190   Cardiac Enzymes: No results for input(s): "CKTOTAL", "CKMB", "CKMBINDEX", "TROPONINI" in the last 168 hours. BNP: Invalid input(s): "POCBNP" CBG: No results for input(s): "GLUCAP" in the last 168 hours. D-Dimer No results for input(s): "DDIMER" in the last 72 hours. Hgb A1c No results for input(s): "HGBA1C" in the last 72 hours. Lipid Profile No results for input(s): "CHOL", "HDL", "LDLCALC", "TRIG", "CHOLHDL", "LDLDIRECT" in the last 72 hours. Thyroid function studies No results for input(s): "TSH", "T4TOTAL", "T3FREE", "THYROIDAB" in the last 72 hours.  Invalid input(s): "FREET3" Anemia work up No results for input(s): "VITAMINB12", "FOLATE", "FERRITIN", "TIBC", "IRON", "RETICCTPCT" in the last 72 hours. Urinalysis    Component Value Date/Time   COLORURINE  YELLOW 10/10/2021 1125   APPEARANCEUR CLEAR 10/10/2021 1125   LABSPEC 1.010 10/10/2021 1125   PHURINE 6.0 10/10/2021 1125   GLUCOSEU NEGATIVE 10/10/2021 1125   HGBUR LARGE (A) 10/10/2021 1125   BILIRUBINUR NEGATIVE 10/10/2021 1125   BILIRUBINUR negative  09/07/2017 [place] 10/10/2021 1125   PROTEINUR NEGATIVE 10/10/2021 1125   UROBILINOGEN 0.2 09/07/2017 1645   UROBILINOGEN 4.0 (H) 08/04/2016 1517   NITRITE NEGATIVE 10/10/2021 1125   LEUKOCYTESUR SMALL (A) 10/10/2021 1125   Sepsis Labs Recent Labs  Lab 03/23/22 1334 03/24/22 0053  WBC 14.8* 13.7*   Microbiology Recent Results (from the past 240 hour(s))  SARS Coronavirus 2 by RT PCR (hospital order, performed in [facility] hospital lab) *cepheid single result test* Anterior Nasal Swab     Status: None   Collection Time: 03/23/22 12:55 PM   Specimen: Anterior Nasal Swab  Result Value Ref Range Status   SARS Coronavirus 2 by RT PCR NEGATIVE NEGATIVE Final    Comment: (NOTE) SARS-CoV-2 target nucleic acids are NOT DETECTED.  The SARS-CoV-2 RNA is generally detectable in upper and lower respiratory specimens during the acute phase of infection. The lowest concentration of SARS-CoV-2 viral copies this assay can detect is 250 copies / mL. A negative result does not preclude SARS-CoV-2 infection and should not be used as the sole basis for treatment or other patient management decisions.  A negative result may occur with improper specimen collection / handling, submission of specimen other than nasopharyngeal swab, presence of viral mutation(s) within the areas targeted by this assay, and inadequate number of viral copies (<250 copies / mL). A negative result must be combined with clinical observations, patient history, and epidemiological information.  Fact Sheet for Patients:   https://www.patel.info/  Fact Sheet for Healthcare Providers: https://hall.com/  This test is not yet approved or  cleared by the [place] FDA and has been authorized for detection and/or diagnosis of SARS-CoV-2 by FDA under an Emergency Use Authorization (EUA).  This EUA will remain in effect (meaning this test can be used) for the  duration of the COVID-19 declaration under Section 564(b)(1) of the Act, 21 U.S.C. section 360bbb-3(b)(1), unless the authorization is terminated or revoked sooner.  Performed at [facility], [place] [address] [place], [place] [address]   Blood culture (routine x 2)     Status: None (Preliminary result)   Collection Time: 03/23/22  1:55 PM   Specimen: BLOOD  Result Value Ref Range Status   Specimen Description BLOOD RIGHT ANTECUBITAL  Final   Special Requests   Final    BOTTLES DRAWN AEROBIC AND ANAEROBIC Blood Culture results may not be optimal due to an inadequate volume of blood received in culture bottles   Culture   Final    NO GROWTH < 24 HOURS Performed at [facility], [place] [address], [place], [place] [address]    Report Status PENDING  Incomplete  Blood culture (routine x 2)     Status: None (Preliminary result)   Collection Time: 03/23/22  1:59 PM   Specimen: BLOOD  Result Value Ref Range Status   Specimen Description BLOOD BLOOD RIGHT FOREARM  Final   Special Requests   Final    BOTTLES DRAWN AEROBIC AND ANAEROBIC Blood Culture results may not be optimal due to an inadequate volume of blood received in culture bottles   Culture   Final    NO GROWTH < 24 HOURS Performed at [facility],  [address], [place], [place] [address]    Report Status PENDING  Incomplete     Time coordinating discharge: Over 30 minutes  SIGNED:   Little Ishikawa, DO [facility] 03/24/2022, 6:16 PM Pager   If 7PM-7AM, please contact night-coverage [email or web address]

## 2022-03-24 NOTE — Progress Notes (Signed)
Mr. Thielen is alert and oriented x4. No complaints of pain. Educated wife and patient on discharge instruction, neither had questions. PIV removed with no issues. Staff assisted pt to w/c. Staff assisted patient to vehicle with wife at side.

## 2022-03-24 NOTE — Care Management (Signed)
durable medical equipment    Durable Medical Equipment  (From admission, onward)           Start     Ordered   03/24/22 1414  For home use only DME Hospital bed  Once       Question Answer Comment  Length of Need Lifetime   Patient has (list medical condition): ALS   The above medical condition requires: Patient requires the ability to reposition frequently   Head must be elevated greater than: 30 degrees   Bed type Semi-electric   Hoyer Lift Yes   Support Surface: Gel Overlay      03/24/22 1414

## 2022-03-24 NOTE — TOC Initial Note (Addendum)
Transition of Care [facility]) - Initial/Assessment Note    Patient Details  Name: Darrell [name] MRN: [id number] Date of Birth: 1951-02-08  Transition of [facility]) CM/SW Contact:    Verdell Carmine, RN Phone Number: 03/24/2022, 9:03 AM  Clinical Narrative:                  71 year old bed bound patient lives at home with spouse. Active  [facility] home [facility]. Presented with illness, cough. SLP evaluation pending. Eats pureed and chopped foods at home. Will  place renewal of [place] orders in for disciplines.  CM will follow for needs, recommendations, and transitions of care.  EDD 1 day 1400 spoke with wife nad daughter. She states there is some equipment that needs to be updated, and then there is some that could be brought back. The bed is very old and hand cranks, she would like an updated bed and hoyer lift. Readjusting of the wheelchair. Called [employer]. Orders in.  There is already a service ticket to pick up electric wheelchair. They will do bed exchange.   Wife can transport home when DC. [facility] Marjory Lies is aware of DC today)for PT home.  1645 discussed with Speech, adding HH SLP let Marjory Lies know from [employer] Expected Discharge Plan: [place] Barriers to Discharge: Continued Medical Work up   Patient Goals and CMS Choice        Expected Discharge Plan and Services Expected Discharge Plan: [place]   Discharge Planning Services: CM Consult Post Acute Care Choice: [place] arrangements for the past 2 months: Single Family Home                           HH Arranged:  (Active with [facility])          Prior Living Arrangements/Services Living arrangements for the past 2 months: Single Family Home Lives with:: Spouse Patient language and need for interpreter reviewed:: Yes        Need for Family Participation in Patient Care: Yes (Comment) Care giver support system in place?: Yes (comment)   Criminal Activity/Legal  Involvement Pertinent to Current Situation/Hospitalization: No - Comment as needed  Activities of Daily Living Home Assistive Devices/Equipment: Other (Comment) ADL Screening (condition at time of admission) Patient's cognitive ability adequate to safely complete daily activities?: Yes Is the patient deaf or have difficulty hearing?: No Does the patient have difficulty seeing, even when wearing glasses/contacts?: No Does the patient have difficulty concentrating, remembering, or making decisions?: No Patient able to express need for assistance with ADLs?: Yes Does the patient have difficulty dressing or bathing?: Yes Independently performs ADLs?: No Communication: Independent Dressing (OT): Dependent Is this a change from baseline?: Pre-admission baseline Grooming: Dependent Is this a change from baseline?: Pre-admission baseline Feeding: Dependent Is this a change from baseline?: Pre-admission baseline Bathing: Dependent Is this a change from baseline?: Pre-admission baseline Toileting: Dependent Is this a change from baseline?: Pre-admission baseline In/Out Bed: Dependent Is this a change from baseline?: Pre-admission baseline Walks in Home: Dependent Is this a change from baseline?: Pre-admission baseline Does the patient have difficulty walking or climbing stairs?: Yes Weakness of Legs: Both Weakness of Arms/Hands: Both  Permission Sought/Granted                  Emotional Assessment       Orientation: : Oriented to Self, Oriented to Place Alcohol / Substance  Use: Not Applicable Psych Involvement: No (comment)  Admission diagnosis:  Community acquired bacterial pneumonia [J15.9] Patient Active Problem List   Diagnosis Date Noted   Community acquired bacterial pneumonia 03/23/2022   Acute respiratory failure with hypoxia ([place]) 10/10/2021   Coffee ground emesis 10/07/2021   Hyperkalemia 10/07/2021   Normocytic anemia 10/07/2021   Bigeminal rhythm 10/07/2021    Fecal impaction ([name])    Acute metabolic encephalopathy 69/48/5462   COVID-19 virus infection 05/06/2021   Anemia, chronic disease 05/06/2021   Benign prostatic hyperplasia 05/06/2021   Ulcer of ankle ([place]) 03/29/2021   CKD (chronic kidney disease), stage IV ([place]) 03/09/2021   Cognitive communication deficit 01/10/2021   Chronic bronchitis ([place]) 05/25/2020   Chronic cough 05/25/2020   Goals of care, counseling/discussion    Palliative care by specialist    ARF (acute renal failure) ([place]) 05/05/2019   Pain due to onychomycosis of toenails of both feet 03/25/2019   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[place]) 07/24/2018   Dysarthria 05/21/2018   Dysphagia 05/21/2018   Gait abnormality 05/21/2018   Spasticity 05/21/2018   GERD (gastroesophageal reflux disease) 07/21/2017   Mixed hyperlipidemia 07/21/2017   Bradycardia    Seizure disorder ([place]) 07/08/2017   Status cardiac pacemaker 01/29/2017   Unintended weight loss 01/29/2017   Tinea pedis of right foot 01/29/2017   Hypertensive retinopathy of both eyes 01/16/2017   Hypercalcemia 01/15/2017   Heart block 01/15/2017   Nuclear sclerosis of both eyes 10/25/2016   Primary open angle glaucoma of both eyes, indeterminate stage 10/25/2016   Glaucoma 09/12/2016   Liver hemangioma 08/14/2016   Renal cyst 08/14/2016   Renal mass, right 08/14/2016   Elevated liver enzymes 08/10/2016   Chronic kidney disease, stage 3b ([place]) 08/16/2015   At high risk for falls 08/16/2015   Subcutaneous nodules 08/16/2015   Prediabetes 10/07/2013   Inguinal hernia 03/25/2013   History of CVA with residual deficit 03/25/2013   Essential hypertension 10/09/2012   Tobacco abuse 10/09/2012   PCP:  [name], MD Pharmacy:   [facility] ([facility] [place] [address] [place] [address] Phone: [phone] Fax: [phone]     Social Determinants of Health  (SDOH) Interventions    Readmission Risk Interventions     No data to display

## 2022-03-24 NOTE — Care Management Obs Status (Signed)
[facility] NOTIFICATION   Patient Details  Name: KEMONTAE DUNKLEE MRN: [id number] Date of Birth: [birth year]   Medicare Observation Status Notification Given:  Yes    Verdell Carmine, RN 03/24/2022, 1:57 PM

## 2022-03-24 NOTE — Progress Notes (Signed)
Speech Language Pathology Treatment: Dysphagia  Patient Details Name: Darrell [name] MRN: [id number] DOB: [birth year] Today's Date: 03/24/2022 Time: 6945-0388 SLP Time Calculation (min) (ACUTE ONLY): 30 min  Assessment / Plan / Recommendation Clinical Impression  Focus of session was education with wife and pt's daughter re: current dysphagia. Discussed results and played video of MBS with clinical explanation of findings and recommendations. Therapist answered questions, demonstrated how to thicken liquids and gave a thickener starter kit. Pt's wife with questions re: best method for pureeing food and if various foods allowed etc. Discussed pattern of ALS in regards to swallow and that dysphagia will likely worsen. Wife and daughter voiced understanding however would benefit from continued education and home health ST.    HPI HPI: Darrell [name] is a 71 y.o. male with medical history significant of advanced ALS currently bedbound with history of seizure, CVA, pacemaker placement, hypertension, hyperlipidemia, CKD 3a and tobacco abuse.  Patient presents with  altered mental status and some coughing more than usual. Chest x-ray concerning for possible left-sided pneumonia. Family indicates he is typically on a mostly chopped. MBS 09/2021 delay to valleculae, frank silent aspiration with nectar. Intermittent backflow into pharynx from esohpagus. Recommend honey thick and puree. MBS in 08/20 recommending dysphagia 1 solids and thin liquids with meds whole in puree.                Marland Kitchen      SLP Plan  Continue with current plan of care      Recommendations for follow up therapy are one component of a multi-disciplinary discharge planning process, led by the attending physician.  Recommendations may be updated based on patient status, additional functional criteria and insurance authorization.    Recommendations  Diet recommendations: Dysphagia 1 (puree);Honey-thick liquid Liquids provided via:  Cup;Straw Medication Administration: Crushed with puree Supervision: Staff to assist with self feeding;Full supervision/cueing for compensatory strategies Compensations: Slow rate;Small sips/bites;Minimize environmental distractions;Clear throat intermittently Postural Changes and/or Swallow Maneuvers: Seated upright 90 degrees                Oral Care Recommendations: Oral care BID Follow Up Recommendations: Home health SLP Assistance recommended at discharge: Frequent or constant Supervision/Assistance SLP Visit Diagnosis: Dysphagia, pharyngeal phase (R13.13) Plan: Continue with current plan of care           [place] Siren  03/24/2022, 4:25 PM

## 2022-03-24 NOTE — Evaluation (Signed)
Clinical/Bedside Swallow Evaluation Patient Details  Name: Darrell [name] MRN: [id number] Date of Birth: [birth year]  Today's Date: 03/24/2022 Time: SLP Start Time (ACUTE ONLY): 7322 SLP Stop Time (ACUTE ONLY): 0935 SLP Time Calculation (min) (ACUTE ONLY): 18 min  Past Medical History:  Past Medical History:  Diagnosis Date   Arthritis    At high risk for falls 08/16/2015   Bradycardia    Chronic kidney disease    stage 3 GFR 30-59 ml/min    CIDP (chronic inflammatory demyelinating polyneuropathy) ([name])    CKD (chronic kidney disease) stage 3, GFR 30-59 ml/min ([place]) 08/16/2015   Dysphagia as late effect of cerebrovascular disease    pts wife states pt has to eat soft foods    Elevated liver enzymes 08/10/2016   GERD (gastroesophageal reflux disease)    Glaucoma    High cholesterol    History of CVA with residual deficit 03/25/2013   Hypertension    Hypertensive retinopathy of both eyes 01/16/2017   Inguinal hernia 03/25/2013   Liver hemangioma 08/14/2016   Neuromuscular disorder ([place])    chronic inflammatory demyelinating polyneuropathy    New onset seizure ([place]) 07/08/2017   seizure 07/14/18   Nuclear sclerosis of both eyes 10/25/2016   Presence of permanent cardiac pacemaker    placed in april 2018   Primary open angle glaucoma of both eyes, indeterminate stage 10/25/2016   Renal mass, right 08/14/2016   Status cardiac pacemaker 01/29/2017   Placed for second degree heart block on 01/16/17 Medtronic Azure XT DR MRI SureScan dual-chamber pacemaker   Stroke [facility])    2011 with residual deficit left sided weakness   Tobacco dependence    Past Surgical History:  Past Surgical History:  Procedure Laterality Date   EYE SURGERY     HERNIA REPAIR     INGUINAL HERNIA REPAIR Right 11/23/2014   Procedure: right inguinal hernia repair with mesh;  Surgeon: Armandina Gemma, MD;  Location: [facility];  Service: General;  Laterality: Right;   INSERTION OF MESH N/A 11/23/2014   Procedure: INSERTION OF  MESH;  Surgeon: Armandina Gemma, MD;  Location: [facility];  Service: General;  Laterality: N/A;   MASS EXCISION Left 08/29/2017   Procedure: EXCISION OF LEFT NECK MASS;  Surgeon: Coralie Keens, MD;  Location: [facility];  Service: General;  Laterality: Left;   PACEMAKER IMPLANT N/A 01/16/2017   Procedure: Pacemaker Implant;  Surgeon: Will Meredith Leeds, MD;  Location: [facility];  Service: Cardiovascular;  Laterality: N/A;   SHOULDER SURGERY Bilateral 1988, 1998   HPI:  Darrell [name] is a 71 y.o. male with medical history significant of advanced ALS currently bedbound with history of seizure, CVA, pacemaker placement, hypertension, hyperlipidemia, CKD 3a and tobacco abuse.  Patient presents with  altered mental status and some coughing more than usual. Chest x-ray concerning for possible left-sided pneumonia. Family indicates he is typically on a mostly chopped. MBS 09/2021 delay to valleculae, frank silent aspiration with nectar. Intermittent backflow into pharynx from esohpagus. Recommend honey thick and puree. MBS in 08/20 recommending dysphagia 1 solids and thin liquids with meds whole in puree.                .    Assessment / Plan / Recommendation  Clinical Impression  Pt's vocal quality is hypophonic as a result of ALS with weak cough. Wife states couging with po's has mildly increased over past several days. No mention of prior MBS or thick liquids from family.  After evaluation SLP learned pt has had two MBS with most previous (09/2021) requiring honey thick liquids. His lingual movement are imprecise with general labial weakness. He would benefit from [facility] although no s/s aspiration present with 3 oz water straw presentation he has multiple swallows. Aspiration opportunities increased with hx dysphagia and clinical presentation today. MBS at 1330 and he may have meds whole in applesauce, thin water (only) unitl study. SLP Visit Diagnosis: Dysphagia, unspecified (R13.10)    Aspiration Risk   Moderate aspiration risk    Diet Recommendation Other (Comment) (water only and applesauce until MBS)   Liquid Administration via: Straw Medication Administration: Whole meds with puree Supervision: Staff to assist with self feeding;Full supervision/cueing for compensatory strategies Compensations: Slow rate;Small sips/bites Postural Changes: Seated upright at 90 degrees    Other  Recommendations Oral Care Recommendations: Oral care BID    Recommendations for follow up therapy are one component of a multi-disciplinary discharge planning process, led by the attending physician.  Recommendations may be updated based on patient status, additional functional criteria and insurance authorization.  Follow up Recommendations        Assistance Recommended at Discharge    Functional Status Assessment    Frequency and Duration            Prognosis        Swallow Study   General Date of Onset: 03/23/22 HPI: Darrell [name] is a 71 y.o. male with medical history significant of advanced ALS currently bedbound with history of seizure, CVA, pacemaker placement, hypertension, hyperlipidemia, CKD 3a and tobacco abuse.  Patient presents with  altered mental status and some coughing more than usual. Chest x-ray concerning for possible left-sided pneumonia. Family indicates he is typically on a mostly chopped. MBS 09/2021 delay to valleculae, frank silent aspiration with nectar. Intermittent backflow into pharynx from esohpagus. Recommend honey thick and puree. MBS in 08/20 recommending dysphagia 1 solids and thin liquids with meds whole in puree.                . Type of Study: Bedside Swallow Evaluation Previous Swallow Assessment:  (see HPI) Diet Prior to this Study: NPO Temperature Spikes Noted: No Respiratory Status: Room air History of Recent Intubation: No Behavior/Cognition: Alert;Cooperative;Pleasant mood Oral Cavity Assessment: Other (comment) (lingual candidias) Oral Care Completed by  SLP: No Oral Cavity - Dentition: Edentulous (stated has 2 teeth) Vision: Impaired for self-feeding (pt is legally blind) Self-Feeding Abilities: Needs assist Patient Positioning: Upright in bed Baseline Vocal Quality: Low vocal intensity Volitional Cough: Weak Volitional Swallow: Able to elicit    Oral/Motor/Sensory Function Overall Oral Motor/Sensory Function: Mild impairment Facial ROM:  (onverall weak) Facial Symmetry: Within Functional Limits Facial Strength:  (overall weak) Lingual ROM:  (imprecision) Mandible: Within Functional Limits   Ice Chips Ice chips: Not tested   Thin Liquid Thin Liquid: Impaired Presentation: Straw Pharyngeal  Phase Impairments: Multiple swallows    Nectar Thick Nectar Thick Liquid: Not tested   Honey Thick Honey Thick Liquid: Not tested   Puree Puree: Impaired Pharyngeal Phase Impairments: Multiple swallows   Solid     Solid: Not tested      [place] Siren 03/24/2022,10:02 AM

## 2022-03-27 ENCOUNTER — Telehealth: Payer: Self-pay

## 2022-03-27 NOTE — Telephone Encounter (Signed)
From the discharge call:  His wife said he is doing well considering everything. She said he has started vomiting again and this is another stage of ALS. She has yankauer suction at home.  She said he now needs a puree, honey thick liquid diet all of the time yet  he still has difficulty tolerating the food.    she would like to know if his insurance company would provide puree meals since he was hospitalized. I told her that I would call the insurance company and check.  she said he has all of the medications. She manages the med regime .  He currently takes his pills in applesauce, she has not needed to crush them.   I instructed her to call me back if she does not have the new hospital bed, hoyer lift and wheelchair by the end of this week. He has a hospital bed and hoyer but is in need of new equipment.    Scheduled to see Dr Margarita Rana - 04/13/2022,  - virtual visit

## 2022-03-27 NOTE — Telephone Encounter (Signed)
Transition Care Management Follow-up Telephone Call  Call completed with patient's wife, [name] Date of discharge and from where: 03/24/2022, [facility] How have you been since you were released from the hospital? His wife said he is doing well considering everything. She said he has started vomiting again and this is another stage of ALS. She has yankauer suction at home.  Any questions or concerns? Yes - she would like to know if his insurance company would provide puree meals since he was hospitalized. I told her that I would call the insurance company and check.   Items Reviewed: Did the pt receive and understand the discharge instructions provided? Yes  Medications obtained and verified? Yes - she said he has all of the medications. She manages the med regime .  He currently takes his pills in applesauce, she has not needed to crush them.  Other? No  Any new allergies since your discharge? No  Dietary orders reviewed? Yes- She said he now needs a puree, honey thick liquid diet all of the time yet  he still has difficulty tolerating the food.   Do you have support at home? Yes - his wife  [place] and Equipment/Supplies: Were home health services ordered? yes If so, what is the name of the agency? [facility]   Has the agency set up a time to come to the patient's home? She has not heard from the agency yet.  Were any new equipment or medical supplies ordered?  Yes: semi electric hospital bed, hoyer lift and manual wheelchair What is the name of the medical supply agency? [employer]  Were you able to get the supplies/equipment? Note yet, but his wife has been contacted by the DME company. Delivery has not been confirmed yet.  Do you have any questions related to the use of the equipment or supplies? No  I instructed her to call me back if she does not have the DME by the end of this week. He has a hospital bed and hoyer but is in need of new equipment.   Functional  Questionnaire: (I = Independent and D = Dependent) ADLs: dependent for all ADLS  Follow up appointments reviewed:  PCP Hospital f/u appt confirmed? Yes  Scheduled to see Dr Margarita Rana - 04/13/2022,   [facility] f/u appt confirmed? Yes  Scheduled to see podiatry 04/03/2022.  Are transportation arrangements needed?  Not at this time.  PCP appointment has been changed to virtual  If their condition worsens, is the pt aware to call PCP or go to the Emergency Dept.? Yes Was the patient provided with contact information for the PCP's office or ED? Yes Was to pt encouraged to call back with questions or concerns? Yes

## 2022-03-28 LAB — CULTURE, BLOOD (ROUTINE X 2)
Culture: NO GROWTH
Culture: NO GROWTH

## 2022-03-30 NOTE — Telephone Encounter (Signed)
I spoke to [facility] and she said a face to face encounter is needed to re-order the DME: semi electric hospital bed, hoyer list and manual wheelchair.

## 2022-03-31 NOTE — Telephone Encounter (Signed)
Will address at upcoming visit.

## 2022-04-03 ENCOUNTER — Encounter: Payer: Self-pay | Admitting: Podiatry

## 2022-04-03 ENCOUNTER — Ambulatory Visit: Payer: Medicare HMO | Admitting: Podiatry

## 2022-04-03 DIAGNOSIS — I6930 Unspecified sequelae of cerebral infarction: Secondary | ICD-10-CM

## 2022-04-03 DIAGNOSIS — M79674 Pain in right toe(s): Secondary | ICD-10-CM | POA: Diagnosis not present

## 2022-04-03 DIAGNOSIS — N183 Chronic kidney disease, stage 3 unspecified: Secondary | ICD-10-CM

## 2022-04-03 DIAGNOSIS — M79675 Pain in left toe(s): Secondary | ICD-10-CM | POA: Diagnosis not present

## 2022-04-03 DIAGNOSIS — B351 Tinea unguium: Secondary | ICD-10-CM | POA: Diagnosis not present

## 2022-04-03 NOTE — Progress Notes (Signed)
This patient returns to my office for at risk foot care.  This patient requires this care by a professional since this patient will be at risk due to having CKD, history of CVA.  This patient is unable to cut nails himself since the patient cannot reach his nails.These nails are painful walking and wearing shoes. This patient has not been seen in over 7 months. This patient presents for at risk foot care today.  He presents with his wife and in a wheelchair.  General Appearance  Alert, conversant and in no acute stress.  Vascular  Dorsalis pedis and posterior tibial  pulses are palpable  bilaterally.  Capillary return is within normal limits  bilaterally. Temperature is within normal limits  bilaterally.  Neurologic  Senn-Weinstein monofilament wire test within normal limits  bilaterally. Muscle power within normal limits bilaterally.  Nails Thick disfigured discolored nails with subungual debris  from hallux to fifth toes bilaterally. No evidence of bacterial infection or drainage bilaterally.  Orthopedic  No limitations of motion  feet .  No crepitus or effusions noted.  No bony pathology or digital deformities noted. Inverted left foot.    Skin  normotropic skin with no porokeratosis noted bilaterally.  No signs of infections or ulcers noted.   Severely contracted toes  B/L. No drainage swelling or inflammation.  Onychomycosis  Pain in right toes  Pain in left toes    Consent was obtained for treatment procedures.   Mechanical debridement of nails 1-5  bilaterally performed with a nail nipper.  Filed with dremel without incident.    Return office visit    3 months                 Told patient to return for periodic foot care and evaluation due to potential at risk complications.   Gardiner Barefoot DPM

## 2022-04-04 ENCOUNTER — Telehealth: Payer: Self-pay | Admitting: *Deleted

## 2022-04-04 MED ORDER — METOPROLOL SUCCINATE ER 50 MG PO TB24: 50.0000 mg | ORAL_TABLET | Freq: Every day | ORAL | 3 refills | Status: DC

## 2022-04-04 NOTE — Telephone Encounter (Signed)
Spoke to wife (dpr on file). Informed of findings and MD recommendations. Agreeable to starting Toprol 50 mg daily at bedtime. Advised to call back if SE arise after starting the medicine.  Aware pt is overdue for follow up and scheduler will reach out to get pt scheduled (ok to schedule w/ EP APP or MD) Wife verbalized understanding and agreeable to plan.

## 2022-04-04 NOTE — Telephone Encounter (Signed)
-----   Message from Will Meredith Leeds, MD sent at 03/24/2022  3:59 PM EDT ----- Abnormal device interrogation reviewed.  Lead parameters and battery status stable.  NSVT noted. Start toprol xl 50 daily

## 2022-04-06 ENCOUNTER — Other Ambulatory Visit: Payer: Medicare HMO

## 2022-04-06 DIAGNOSIS — Z515 Encounter for palliative care: Secondary | ICD-10-CM

## 2022-04-07 ENCOUNTER — Ambulatory Visit: Payer: Self-pay | Admitting: *Deleted

## 2022-04-07 DIAGNOSIS — N183 Chronic kidney disease, stage 3 unspecified: Secondary | ICD-10-CM | POA: Diagnosis not present

## 2022-04-07 DIAGNOSIS — N2889 Other specified disorders of kidney and ureter: Secondary | ICD-10-CM | POA: Diagnosis not present

## 2022-04-07 DIAGNOSIS — I129 Hypertensive chronic kidney disease with stage 1 through stage 4 chronic kidney disease, or unspecified chronic kidney disease: Secondary | ICD-10-CM | POA: Diagnosis not present

## 2022-04-07 NOTE — Telephone Encounter (Signed)
Summary: Swelling on right thigh   Spouse called on behalf of he patient who is her husband. She states he has swelling on his right thigh and seems lethargic. The area is warm to the touch. The patient is an ALS patient. Please assist patient further..       Chief Complaint: knot on leg, hurting all over Symptoms: feels warm, pt lethargic Frequency: all day Pertinent Negatives: Patient denies any other symptoms Disposition: '[x]'$ ED /'[]'$ Urgent Care (no appt availability in office) / '[]'$ Appointment(In office/virtual)/ '[]'$  [facility]/ Home Care/ '[]'$ Refused Recommended Disposition /'[]'$ [facility]/ '[]'$  Follow-up with PCP Additional Notes: Pt has ALS so unable to be taken in car. EMS called to evaluate.   Reason for Disposition  Sounds like a life-threatening emergency to the triager  Answer Assessment - Initial Assessment Questions 1. ONSET: "When did the swelling start?" (e.g., minutes, hours, days)     This week 2. LOCATION: "What part of the leg is swollen?"  "Are both legs swollen or just one leg?"     Just right upper thigh 3. SEVERITY: "How bad is the swelling?" (e.g., localized; mild, moderate, severe)  - Localized - small area of swelling localized to one leg  - MILD pedal edema - swelling limited to foot and ankle, pitting edema < 1/4 inch (6 mm) deep, rest and elevation eliminate most or all swelling  - MODERATE edema - swelling of lower leg to knee, pitting edema > 1/4 inch (6 mm) deep, rest and elevation only partially reduce swelling  - SEVERE edema - swelling extends above knee, facial or hand swelling present      A lot per wife, about 4" 4. REDNESS: "Does the swelling look red or infected?"     It is not red but it is warm 5. PAIN: "Is the swelling painful to touch?" If Yes, ask: "How painful is it?"   (Scale 1-10; mild, moderate or severe)     10 6. FEVER: "Do you have a fever?" If Yes, ask: "What is it, how was it measured, and when did it start?"       no 7. CAUSE: "What do you think is causing the leg swelling?"     unknown 8. MEDICAL HISTORY: "Do you have a history of heart failure, kidney disease, liver failure, or cancer?"     ALS 9. RECURRENT SYMPTOM: "Have you had leg swelling before?" If Yes, ask: "When was the last time?" "What happened that time?"     no 10. OTHER SYMPTOMS: "Do you have any other symptoms?" (e.g., chest pain, difficulty breathing)       no 11. PREGNANCY: "Is there any chance you are pregnant?" "When was your last menstrual period?"       Na  Protocols used: Leg Swelling and Edema-A-AH

## 2022-04-07 NOTE — Telephone Encounter (Signed)
Routing to PCP for review.

## 2022-04-10 NOTE — Telephone Encounter (Signed)
If he is lethargic, I would recommend an urgent care or ED visit.  If lethargy has improved and he just has a swelling an office visit is needed for further evaluation.  A video visit could also serve the purpose so the area of swelling can be evaluated.

## 2022-04-11 ENCOUNTER — Other Ambulatory Visit: Payer: Medicare HMO | Admitting: *Deleted

## 2022-04-11 ENCOUNTER — Telehealth: Payer: Self-pay

## 2022-04-11 ENCOUNTER — Other Ambulatory Visit: Payer: Medicare HMO

## 2022-04-11 DIAGNOSIS — Z515 Encounter for palliative care: Secondary | ICD-10-CM

## 2022-04-11 NOTE — Progress Notes (Signed)
Remote pacemaker transmission.   

## 2022-04-11 NOTE — Telephone Encounter (Signed)
I called [facility] customer service # [phone] and was eventually connected to [employer]/ member benefits.  Patient's new member ID is [id number]. Ben explained that after hospitalization, the member is eligible for 14 home delivered meals.  There is no required hospital length of stay. She was not sure if puree meals could be provided. She said that the member or representative can call [phone] to order the meals. Nothing is needed from the provider.  Call reference # [id number]

## 2022-04-12 ENCOUNTER — Other Ambulatory Visit: Payer: Self-pay

## 2022-04-12 ENCOUNTER — Emergency Department (HOSPITAL_COMMUNITY): Payer: Medicare HMO

## 2022-04-12 ENCOUNTER — Inpatient Hospital Stay (HOSPITAL_COMMUNITY)
Admission: EM | Admit: 2022-04-12 | Discharge: 2022-04-16 | DRG: 641 | Disposition: A | Discharge status: Home Health | Payer: Medicare HMO | Attending: Family Medicine | Admitting: Family Medicine

## 2022-04-12 DIAGNOSIS — Z888 Allergy status to other drugs, medicaments and biological substances status: Secondary | ICD-10-CM

## 2022-04-12 DIAGNOSIS — J181 Lobar pneumonia, unspecified organism: Secondary | ICD-10-CM | POA: Diagnosis not present

## 2022-04-12 DIAGNOSIS — J449 Chronic obstructive pulmonary disease, unspecified: Secondary | ICD-10-CM | POA: Diagnosis present

## 2022-04-12 DIAGNOSIS — R1111 Vomiting without nausea: Secondary | ICD-10-CM | POA: Diagnosis not present

## 2022-04-12 DIAGNOSIS — Z743 Need for continuous supervision: Secondary | ICD-10-CM | POA: Diagnosis not present

## 2022-04-12 DIAGNOSIS — Z7401 Bed confinement status: Secondary | ICD-10-CM

## 2022-04-12 DIAGNOSIS — I10 Essential (primary) hypertension: Secondary | ICD-10-CM | POA: Diagnosis not present

## 2022-04-12 DIAGNOSIS — R112 Nausea with vomiting, unspecified: Secondary | ICD-10-CM | POA: Diagnosis not present

## 2022-04-12 DIAGNOSIS — N179 Acute kidney failure, unspecified: Secondary | ICD-10-CM

## 2022-04-12 DIAGNOSIS — K219 Gastro-esophageal reflux disease without esophagitis: Secondary | ICD-10-CM | POA: Diagnosis present

## 2022-04-12 DIAGNOSIS — Z91013 Allergy to seafood: Secondary | ICD-10-CM

## 2022-04-12 DIAGNOSIS — N401 Enlarged prostate with lower urinary tract symptoms: Secondary | ICD-10-CM | POA: Diagnosis present

## 2022-04-12 DIAGNOSIS — Z66 Do not resuscitate: Secondary | ICD-10-CM | POA: Diagnosis present

## 2022-04-12 DIAGNOSIS — R110 Nausea: Secondary | ICD-10-CM | POA: Diagnosis not present

## 2022-04-12 DIAGNOSIS — D631 Anemia in chronic kidney disease: Secondary | ICD-10-CM | POA: Diagnosis present

## 2022-04-12 DIAGNOSIS — Z881 Allergy status to other antibiotic agents status: Secondary | ICD-10-CM

## 2022-04-12 DIAGNOSIS — I69391 Dysphagia following cerebral infarction: Secondary | ICD-10-CM

## 2022-04-12 DIAGNOSIS — E782 Mixed hyperlipidemia: Secondary | ICD-10-CM | POA: Diagnosis present

## 2022-04-12 DIAGNOSIS — H401134 Primary open-angle glaucoma, bilateral, indeterminate stage: Secondary | ICD-10-CM | POA: Diagnosis present

## 2022-04-12 DIAGNOSIS — Z950 Presence of cardiac pacemaker: Secondary | ICD-10-CM

## 2022-04-12 DIAGNOSIS — F17210 Nicotine dependence, cigarettes, uncomplicated: Secondary | ICD-10-CM | POA: Diagnosis present

## 2022-04-12 DIAGNOSIS — I69354 Hemiplegia and hemiparesis following cerebral infarction affecting left non-dominant side: Secondary | ICD-10-CM

## 2022-04-12 DIAGNOSIS — Z833 Family history of diabetes mellitus: Secondary | ICD-10-CM

## 2022-04-12 DIAGNOSIS — Z825 Family history of asthma and other chronic lower respiratory diseases: Secondary | ICD-10-CM

## 2022-04-12 DIAGNOSIS — G40909 Epilepsy, unspecified, not intractable, without status epilepticus: Secondary | ICD-10-CM | POA: Diagnosis present

## 2022-04-12 DIAGNOSIS — N184 Chronic kidney disease, stage 4 (severe): Secondary | ICD-10-CM | POA: Diagnosis present

## 2022-04-12 DIAGNOSIS — E875 Hyperkalemia: Secondary | ICD-10-CM | POA: Diagnosis not present

## 2022-04-12 DIAGNOSIS — G1221 Amyotrophic lateral sclerosis: Secondary | ICD-10-CM | POA: Diagnosis present

## 2022-04-12 DIAGNOSIS — R338 Other retention of urine: Secondary | ICD-10-CM | POA: Diagnosis present

## 2022-04-12 DIAGNOSIS — Z79899 Other long term (current) drug therapy: Secondary | ICD-10-CM

## 2022-04-12 DIAGNOSIS — Z720 Tobacco use: Secondary | ICD-10-CM | POA: Diagnosis present

## 2022-04-12 DIAGNOSIS — G6181 Chronic inflammatory demyelinating polyneuritis: Secondary | ICD-10-CM | POA: Diagnosis present

## 2022-04-12 DIAGNOSIS — Z7951 Long term (current) use of inhaled steroids: Secondary | ICD-10-CM

## 2022-04-12 DIAGNOSIS — I129 Hypertensive chronic kidney disease with stage 1 through stage 4 chronic kidney disease, or unspecified chronic kidney disease: Secondary | ICD-10-CM | POA: Diagnosis present

## 2022-04-12 DIAGNOSIS — Z8249 Family history of ischemic heart disease and other diseases of the circulatory system: Secondary | ICD-10-CM

## 2022-04-12 DIAGNOSIS — I6930 Unspecified sequelae of cerebral infarction: Secondary | ICD-10-CM

## 2022-04-12 HISTORY — DX: Pneumonia, unspecified organism: J18.9

## 2022-04-12 HISTORY — DX: Unspecified asthma, uncomplicated: J45.909

## 2022-04-12 LAB — CBC
HCT: 35.2 % — ABNORMAL LOW (ref 39.0–52.0)
Hemoglobin: 11.4 g/dL — ABNORMAL LOW (ref 13.0–17.0)
MCH: 28.4 pg (ref 26.0–34.0)
MCHC: 32.4 g/dL (ref 30.0–36.0)
MCV: 87.6 fL (ref 80.0–100.0)
Platelets: 266 10*3/uL (ref 150–400)
RBC: 4.02 MIL/uL — ABNORMAL LOW (ref 4.22–5.81)
RDW: 13.6 % (ref 11.5–15.5)
WBC: 15.9 10*3/uL — ABNORMAL HIGH (ref 4.0–10.5)
nRBC: 0 % (ref 0.0–0.2)

## 2022-04-12 LAB — COMPREHENSIVE METABOLIC PANEL
ALT: 11 U/L (ref 0–44)
AST: 12 U/L — ABNORMAL LOW (ref 15–41)
Albumin: 3.4 g/dL — ABNORMAL LOW (ref 3.5–5.0)
Alkaline Phosphatase: 100 U/L (ref 38–126)
Anion gap: 8 (ref 5–15)
BUN: 45 mg/dL — ABNORMAL HIGH (ref 8–23)
CO2: 23 mmol/L (ref 22–32)
Calcium: 10 mg/dL (ref 8.9–10.3)
Chloride: 111 mmol/L (ref 98–111)
Creatinine, Ser: 3.01 mg/dL — ABNORMAL HIGH (ref 0.61–1.24)
GFR, Estimated: 22 mL/min — ABNORMAL LOW (ref >60)
Glucose, Bld: 142 mg/dL — ABNORMAL HIGH (ref 70–99)
Potassium: 5.6 mmol/L — ABNORMAL HIGH (ref 3.5–5.1)
Sodium: 142 mmol/L (ref 135–145)
Total Bilirubin: 0.7 mg/dL (ref 0.3–1.2)
Total Protein: 6.5 g/dL (ref 6.5–8.1)

## 2022-04-12 LAB — LIPASE, BLOOD: Lipase: 42 U/L (ref 11–51)

## 2022-04-12 MED ORDER — ONDANSETRON 4 MG PO TBDP
4.0000 mg | ORAL_TABLET | Freq: Once | ORAL | Status: AC | PRN
Start: 2022-04-12 — End: 2022-04-12
  Administered 2022-04-12: 4 mg via ORAL
  Filled 2022-04-12: qty 1

## 2022-04-12 MED ORDER — SODIUM CHLORIDE 0.9 % IV BOLUS
1000.0000 mL | Freq: Once | INTRAVENOUS | Status: AC
  Administered 2022-04-12: 1000 mL via INTRAVENOUS

## 2022-04-12 NOTE — Progress Notes (Signed)
COMMUNITY PALLIATIVE CARE SW NOTE  PATIENT NAME: Darrell [name] DOB: Nov 20, 1950 MRN: [id number]  PRIMARY CARE PROVIDER: [name], MD  RESPONSIBLE PARTY:  Acct ID - Guarantor Home Phone Work Phone Relationship Acct Type  [id number] [name]  Self P/F     [address], [place], [place] [address]   [place] and RN-M. Nadara Mustard completed a visit with patient at his home. Patient's wife-[name] was present for the visit. Patient was in the bed with his eyes closed. He stated "I'm feeling good" and denied that he was having any pain. He verbalized no breath ing issues. He has occasional forgetfulness. His appetite is good as he is eating three meals a day, but the portions are smaller. Patient is now on a pureed diet with honey thick fluids, except water. His fluid intake is good. Patient is legally blind. His last hospitalization was in March 23, 2022. He is sleeping more throughout the day. Patient is bedbound now. His wife and brother-in-law are getting patient up and out to doctor's appointments, but it is getting more difficult to get him out as he is no longer able to help with standing or positioning. Patient report discomfort in his left shoulder, as a result of family getting him up. His bowels are looser, but regular. Patient speech is softer due to weakness. His verbalizations are whisper-like and breathy.  Patient is scheduled to see his cardiologist on Thursday. His wife advised patient  needs and wheelchair, they need assistance with transportation to doctor's appointment and she needs to follow-up with the ALS clinic as he has not been in some time.  (3:44 pm) SW consulted with Elmyra Ricks, ALS RN Coordinator @ [facility] to provide an update on patient status and request assistance with wheelchair and transportation. Elmyra Ricks will follow-up with patient's wife, and have SW coordinate transportation and DME needs. Patient maybe eligible for grants and DME loan  equipment. SW provided team contact information and encouraged her to call with any questions or concerns.   SOCIAL HISTORY: Patient was born and raised in [place], [name]. He is a retired Administrator. He has been married to his wife for 69 years and they have one daughter. 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 Patient have a good family and faith network, who provide support and assist with patient's care. Patient has a MOST form (DNR, Limited Interventions, ABT, Limited IV, No feeding Tube) and is a DNR.  Patient is scheduled to see his cardiologist on Thursday. He is scheduled to see Dr. Mariea Clonts for palliative care f/u on 06/06/22 @ 9 am.  CODE STATUS: DNR ADVANCED DIRECTIVES: No MOST FORM COMPLETE:  Yes HOSPICE EDUCATION PROVIDED: Education  provided.  Duration of visit and documentation: 70 minutes  Katheren Puller, LCSW

## 2022-04-12 NOTE — ED Triage Notes (Signed)
Pt here from home for N/V, denies abd pain. Pt states he has vomited 3 times today. Pt is bed bound at baseline due to ALS. CBG 168, 160/90, 90HR, 96% RA.

## 2022-04-12 NOTE — Telephone Encounter (Signed)
I called patient's wife and explained to her that her husband should be eligible for meal delivery. I provided her with the phone number for [facility] meals as well as his member ID number and call reference number and she said she will call and order.  She also inquired about incontinence supplies. I told her that his insurance may not cover the supplies but we can send an order to a supplier and they will verify the coverage. His insurance may also have an OTC benefit that can be used to purchase the items

## 2022-04-12 NOTE — ED Provider Notes (Signed)
Is a [facility] EMERGENCY DEPARTMENT Provider Note   CSN: [id number] Arrival date & time: 04/12/22  1953     History  Chief Complaint  Patient presents with   Nausea   Emesis    Darrell [name] is a 71 y.o. male.   Emesis Patient presents with nausea and vomiting.  No abdominal pain.  No diarrhea.  Only around 3 times.  Baseline ALS and is bedbound.  Does have some chronic kidney disease.  No known sick contacts.  No abdominal pain.  Most of the history comes from the patient's wife.     Home Medications Prior to Admission medications   Medication Sig Start Date End Date Taking? Authorizing Provider  acetaminophen (TYLENOL) 325 MG tablet Take 325 mg by mouth daily as needed (pain).   Yes [provider]  albuterol (VENTOLIN HFA) 108 (90 Base) MCG/ACT inhaler Inhale 2 puffs into the lungs every 6 (six) hours as needed for up to 30 days for Wheezing. Patient taking differently: Inhale 2 puffs into the lungs as needed for wheezing or shortness of breath. 06/08/21  Yes Freeman Caldron M, PA-C  amLODipine (NORVASC) 10 MG tablet Take 1 tablet by mouth once daily Patient taking differently: Take 10 mg by mouth daily. 03/23/22  Yes [name], MD  atorvastatin (LIPITOR) 40 MG tablet Take 1 tablet (40 mg total) by mouth daily. 10/27/21  Yes [name], MD  azithromycin (ZITHROMAX) 500 MG tablet Take 1 tablet (500 mg total) by mouth See admin instructions. Take 1 tablet by mouth Monday,Wednesday and Friday 03/29/22  Yes Little Ishikawa, MD  brimonidine [facility]) 0.2 % ophthalmic solution Place 1 drop into both eyes daily. 07/25/21  Yes [provider]  cetirizine (ZYRTEC) 10 MG tablet Take 1 tablet (10 mg total) by mouth daily. 01/24/22  Yes [name], MD  dicyclomine (BENTYL) 10 MG capsule Take 10 mg by mouth daily as needed (pain).   Yes [provider]  dorzolamide-timolol (COSOPT) 22.3-6.8 MG/ML ophthalmic solution Place 1 drop  into both eyes daily in the afternoon.   Yes [provider]  ferrous sulfate (FEROSUL) 325 (65 FE) MG tablet Take 1 tablet (325 mg total) by mouth 2 (two) times daily with a meal. 06/08/21  Yes McClung, Angela M, PA-C  finasteride (PROSCAR) 5 MG tablet Take 5 mg by mouth daily. 12/13/21  Yes [provider]  fluticasone furoate-vilanterol (BREO ELLIPTA) 200-25 MCG/ACT AEPB Inhale 2 puffs into the lungs daily.   Yes [provider]  glycopyrrolate (ROBINUL) 1 MG tablet Take 1 tablet (1 mg total) by mouth 2 times daily. Patient taking differently: Take 1 mg by mouth 2 (two) times daily. 10/27/21  Yes [name], MD  hydrALAZINE (APRESOLINE) 10 MG tablet Take 1 tablet (10 mg total) by mouth 3 (three) times daily. Patient taking differently: Take 10 mg by mouth daily in the afternoon. 01/24/22  Yes Newlin, Enobong, MD  latanoprost (XALATAN) 0.005 % ophthalmic solution Place 1 drop into both eyes at bedtime. 07/25/21  Yes [provider]  levETIRAcetam (KEPPRA) 500 MG tablet Take 1 tablet (500 mg total) by mouth 2 (two) times daily. 12/19/21  Yes Penumalli, Earlean Polka, MD  metoprolol succinate (TOPROL-XL) 50 MG 24 hr tablet Take 1 tablet (50 mg total) by mouth at bedtime. Take with or immediately following a meal. 04/04/22  Yes Camnitz, Will Hassell Done, MD  montelukast (SINGULAIR) 10 MG tablet Take 10 mg by mouth at bedtime. 08/29/21  Yes  [provider]  Multiple Vitamin (MULTI-VITAMIN) tablet Take 1 tablet by mouth daily.   Yes [provider]  tamsulosin (FLOMAX) 0.4 MG CAPS capsule Take 1 capsule (0.4 mg total) by mouth daily. 01/24/22  Yes [name], MD  torsemide (DEMADEX) 20 MG tablet Take 20 mg by mouth daily as needed (fluid).   Yes [provider]  umeclidinium bromide (INCRUSE ELLIPTA) 62.5 MCG/ACT AEPB Inhale 2 puffs into the lungs daily.   Yes [provider]  Budeson-Glycopyrrol-Formoterol (BREZTRI AEROSPHERE) 160-9-4.8  MCG/ACT AERO Inhale 2 puffs into the lungs 2 (two) times daily. Patient not taking: Reported on 04/13/2022 08/01/21   [name], MD  Misc. Devices MISC [place] suction. Dx: ALS 11/11/19   [name], MD  Misc. Devices MISC Coughalator 04/01/20   [name], MD  Misc. Devices MISC Nebulizer machine.  Diagnosis- Motor neuron disease 04/01/20   [name], MD  Misc. [facility] Bed HER74.08 06/01/20   [name], MD  Misc. [facility] bed extension. Diagnosis: ALS. Height 6'4" 06/10/20   [name], MD  Misc. Devices MISC Manual wheelchair.  Diagnosis-ALS 05/04/21   [name], MD  Misc. Devices MISC Right and left wrist splint 12/05/21   [name], MD  polyethylene glycol (MIRALAX) 17 g packet Take 17 g by mouth daily. Patient not taking: Reported on 04/13/2022 10/11/21   Shelly Coss, MD      Allergies    Ace inhibitors, Doxycycline, Atacand hct [candesartan cilexetil-hctz], and Shellfish allergy    Review of Systems   Review of Systems  Gastrointestinal:  Positive for vomiting.    Physical Exam Updated Vital Signs BP (!) 143/81   Pulse 67   Temp 99 F (37.2 C) (Oral)   Resp 20   SpO2 93%  Physical Exam Vitals and nursing note reviewed.  HENT:     Head: Atraumatic.  Eyes:     Pupils: Pupils are equal, round, and reactive to light.  Cardiovascular:     Rate and Rhythm: Regular rhythm.  Chest:     Chest wall: No tenderness.  Abdominal:     Tenderness: There is no abdominal tenderness.  Neurological:     Mental Status: He is alert. Mental status is at baseline.     Comments: Chronically weak from his ALS.  Mostly bedbound.     ED Results / Procedures / Treatments   Labs (all labs ordered are listed, but only abnormal results are displayed) Labs Reviewed  COMPREHENSIVE METABOLIC PANEL - Abnormal; Notable for the following components:      Result Value   Potassium 5.6 (*)    Glucose, Bld 142 (*)    BUN 45  (*)    Creatinine, Ser 3.01 (*)    Albumin 3.4 (*)    AST 12 (*)    GFR, Estimated 22 (*)    All other components within normal limits  CBC - Abnormal; Notable for the following components:   WBC 15.9 (*)    RBC 4.02 (*)    Hemoglobin 11.4 (*)    HCT 35.2 (*)    All other components within normal limits  BASIC METABOLIC PANEL - Abnormal; Notable for the following components:   Potassium 5.7 (*)    Chloride 114 (*)    CO2 21 (*)    Glucose, Bld 151 (*)    BUN 45 (*)    Creatinine, Ser 2.96 (*)    GFR, Estimated 22 (*)    All  other components within normal limits  GLUCOSE, RANDOM - Abnormal; Notable for the following components:   Glucose, Bld 122 (*)    All other components within normal limits  BASIC METABOLIC PANEL - Abnormal; Notable for the following components:   Potassium 5.7 (*)    Chloride 116 (*)    Glucose, Bld 112 (*)    BUN 48 (*)    Creatinine, Ser 3.21 (*)    GFR, Estimated 20 (*)    Anion gap 3 (*)    All other components within normal limits  CBG MONITORING, ED - Abnormal; Notable for the following components:   Glucose-Capillary 151 (*)    All other components within normal limits  LIPASE, BLOOD  URINALYSIS, ROUTINE W REFLEX MICROSCOPIC  BASIC METABOLIC PANEL    EKG None  Radiology DG Abdomen Acute W/Chest  Result Date: 04/12/2022 CLINICAL DATA:  Vomiting, nausea EXAM: DG ABDOMEN ACUTE WITH 1 VIEW CHEST COMPARISON:  03/23/2022 FINDINGS: Supine and upright frontal views of the abdomen and pelvis as well as an upright frontal view of the chest are obtained. Dual lead pacer again noted. Cardiac silhouette is stable. Persistent left basilar consolidation and likely small left effusion. Right chest is clear. No pneumothorax. Bowel gas pattern is unremarkable without obstruction or ileus. Retained oral contrast throughout the colon. Multiple rounded densities projecting over the left upper quadrant likely reflect ingested pills. No free gas in the greater  peritoneal sac. No abdominal masses or abnormal calcifications. IMPRESSION: 1. Unremarkable bowel gas pattern. 2. Stable left basilar consolidation and likely small left effusion. Electronically Signed   By: Randa Ngo M.D.   On: 04/12/2022 21:18    Procedures Procedures    Medications Ordered in ED Medications  azithromycin (ZITHROMAX) tablet 500 mg (has no administration in time range)  amLODipine (NORVASC) tablet 10 mg (10 mg Oral Given 04/13/22 1314)  atorvastatin (LIPITOR) tablet 40 mg (40 mg Oral Given 04/13/22 1316)  hydrALAZINE (APRESOLINE) tablet 10 mg (10 mg Oral Given 04/13/22 1316)  metoprolol succinate (TOPROL-XL) 24 hr tablet 50 mg (has no administration in time range)  glycopyrrolate (ROBINUL) tablet 1 mg (1 mg Oral Given 04/13/22 1314)  finasteride (PROSCAR) tablet 5 mg (5 mg Oral Given 04/13/22 1314)  tamsulosin (FLOMAX) capsule 0.4 mg (0.4 mg Oral Given 04/13/22 1315)  levETIRAcetam (KEPPRA) tablet 500 mg (500 mg Oral Given 04/13/22 1316)  loratadine (CLARITIN) tablet 10 mg (10 mg Oral Given 04/13/22 1315)  montelukast (SINGULAIR) tablet 10 mg (has no administration in time range)  brimonidine (ALPHAGAN) 0.2 % ophthalmic solution 1 drop (1 drop Both Eyes Not Given 04/13/22 1518)  dorzolamide-timolol (COSOPT) 22.3-6.8 MG/ML ophthalmic solution 1 drop (1 drop Both Eyes Not Given 04/13/22 1518)  latanoprost (XALATAN) 0.005 % ophthalmic solution 1 drop (has no administration in time range)  enoxaparin (LOVENOX) injection 40 mg (40 mg Subcutaneous Given 04/13/22 1316)  sodium chloride flush (NS) 0.9 % injection 3 mL (3 mLs Intravenous Given 04/13/22 1517)  lactated ringers infusion ( Intravenous New Bag/Given 04/13/22 1319)  acetaminophen (TYLENOL) tablet 650 mg (has no administration in time range)    Or  acetaminophen (TYLENOL) suppository 650 mg (has no administration in time range)  oxyCODONE (Oxy IR/ROXICODONE) immediate release tablet 5 mg (has no administration in time range)   morphine (PF) 2 MG/ML injection 2 mg (has no administration in time range)  docusate sodium (COLACE) capsule 100 mg (100 mg Oral Given 04/13/22 1316)  bisacodyl (DULCOLAX) EC tablet 5 mg (has no administration  in time range)  ondansetron (ZOFRAN) tablet 4 mg (has no administration in time range)    Or  ondansetron (ZOFRAN) injection 4 mg (has no administration in time range)  hydrALAZINE (APRESOLINE) injection 5 mg (has no administration in time range)  albuterol (PROVENTIL) (2.5 MG/3ML) 0.083% nebulizer solution 2.5 mg (has no administration in time range)  nicotine (NICODERM CQ - dosed in mg/24 hours) patch 14 mg (14 mg Transdermal Patch Applied 04/13/22 1316)  fluticasone furoate-vilanterol (BREO ELLIPTA) 200-25 MCG/ACT 2 puff (2 puffs Inhalation Not Given 04/13/22 1518)  umeclidinium bromide (INCRUSE ELLIPTA) 62.5 MCG/ACT 2 puff (2 puffs Inhalation Not Given 04/13/22 1518)  ondansetron (ZOFRAN-ODT) disintegrating tablet 4 mg (4 mg Oral Given 04/12/22 2005)  sodium chloride 0.9 % bolus 1,000 mL (0 mLs Intravenous Stopped 04/12/22 2204)  sodium chloride 0.9 % bolus 1,000 mL (0 mLs Intravenous Stopped 04/13/22 0334)  pantoprazole (PROTONIX) 40 MG injection (40 mg  Given 04/13/22 0110)  furosemide (LASIX) injection 40 mg (40 mg Intravenous Given 04/13/22 0411)  sodium chloride 0.9 % bolus 500 mL (500 mLs Intravenous New Bag/Given 04/13/22 0415)  sodium zirconium cyclosilicate (LOKELMA) packet 10 g (10 g Oral Given 04/13/22 0413)  insulin aspart (novoLOG) injection 5 Units (5 Units Intravenous Given 04/13/22 0350)    And  dextrose 50 % solution 50 mL (50 mLs Intravenous Given 04/13/22 0350)  dextrose 10 % infusion ( Intravenous New Bag/Given 04/13/22 0411)    ED Course/ Medical Decision Making/ A&P                           Medical Decision Making Amount and/or Complexity of Data Reviewed Labs: ordered. Radiology: ordered. ECG/medicine tests: ordered.  Risk OTC drugs. Prescription drug  management. Decision regarding hospitalization.  Patient presents with nausea vomiting diarrhea.  Baseline ALS.  Differential diagnosis includes gastroenteritis, obstruction, intra-abdominal pathology.  Overall appears close to his baseline.  Discussed with patient and the wife.  No known sick contacts.  Acute abdominal series done and showed no obstruction.  Chest x-ray on the acute abdominal series showed stable findings from prior with recent pneumonia.  However not having pulmonary issues.  Initial lab work showed a mildly worsening of his baseline chronic kidney disease.  Also elevated potassium.  Further discussions with patient and wife and would if possible like to get home but will do what ever is medically best.  Fluid boluses will be repeated.  Care will be turned over to Dr. Dayna Barker.        Final Clinical Impression(s) / ED Diagnoses Final diagnoses:  Nausea and vomiting, unspecified vomiting type  AKI (acute kidney injury) ([place])  Hyperkalemia    Rx / DC Orders ED Discharge Orders     None         Davonna Belling, MD 04/13/22 1538

## 2022-04-13 ENCOUNTER — Encounter: Payer: Medicare Other | Admitting: Student

## 2022-04-13 ENCOUNTER — Ambulatory Visit: Payer: Medicare Other | Attending: Family Medicine | Admitting: Family Medicine

## 2022-04-13 ENCOUNTER — Encounter (HOSPITAL_COMMUNITY): Payer: Self-pay | Admitting: Internal Medicine

## 2022-04-13 DIAGNOSIS — Z66 Do not resuscitate: Secondary | ICD-10-CM | POA: Diagnosis present

## 2022-04-13 DIAGNOSIS — E875 Hyperkalemia: Principal | ICD-10-CM

## 2022-04-13 LAB — BASIC METABOLIC PANEL
Anion gap: 6 (ref 5–15)
Anion gap: 3 — ABNORMAL LOW (ref 5–15)
Anion gap: 6 (ref 5–15)
BUN: 45 mg/dL — ABNORMAL HIGH (ref 8–23)
BUN: 48 mg/dL — ABNORMAL HIGH (ref 8–23)
BUN: 47 mg/dL — ABNORMAL HIGH (ref 8–23)
CO2: 21 mmol/L — ABNORMAL LOW (ref 22–32)
CO2: 22 mmol/L (ref 22–32)
CO2: 20 mmol/L — ABNORMAL LOW (ref 22–32)
Calcium: 9.2 mg/dL (ref 8.9–10.3)
Calcium: 9.2 mg/dL (ref 8.9–10.3)
Calcium: 9.3 mg/dL (ref 8.9–10.3)
Chloride: 114 mmol/L — ABNORMAL HIGH (ref 98–111)
Chloride: 116 mmol/L — ABNORMAL HIGH (ref 98–111)
Chloride: 114 mmol/L — ABNORMAL HIGH (ref 98–111)
Creatinine, Ser: 2.96 mg/dL — ABNORMAL HIGH (ref 0.61–1.24)
Creatinine, Ser: 3.21 mg/dL — ABNORMAL HIGH (ref 0.61–1.24)
Creatinine, Ser: 3.2 mg/dL — ABNORMAL HIGH (ref 0.61–1.24)
GFR, Estimated: 22 mL/min — ABNORMAL LOW (ref >60)
GFR, Estimated: 20 mL/min — ABNORMAL LOW (ref >60)
GFR, Estimated: 20 mL/min — ABNORMAL LOW (ref >60)
Glucose, Bld: 151 mg/dL — ABNORMAL HIGH (ref 70–99)
Glucose, Bld: 112 mg/dL — ABNORMAL HIGH (ref 70–99)
Glucose, Bld: 129 mg/dL — ABNORMAL HIGH (ref 70–99)
Potassium: 5.7 mmol/L — ABNORMAL HIGH (ref 3.5–5.1)
Potassium: 5.7 mmol/L — ABNORMAL HIGH (ref 3.5–5.1)
Potassium: 5.3 mmol/L — ABNORMAL HIGH (ref 3.5–5.1)
Sodium: 141 mmol/L (ref 135–145)
Sodium: 141 mmol/L (ref 135–145)
Sodium: 140 mmol/L (ref 135–145)

## 2022-04-13 LAB — CBG MONITORING, ED: Glucose-Capillary: 151 mg/dL — ABNORMAL HIGH (ref 70–99)

## 2022-04-13 LAB — GLUCOSE, RANDOM: Glucose, Bld: 122 mg/dL — ABNORMAL HIGH (ref 70–99)

## 2022-04-13 MED ORDER — ATORVASTATIN CALCIUM 40 MG PO TABS
40.0000 mg | ORAL_TABLET | Freq: Every day | ORAL | Status: DC
  Administered 2022-04-13 – 2022-04-16 (×4): 40 mg via ORAL
  Filled 2022-04-13 (×4): qty 1

## 2022-04-13 MED ORDER — FUROSEMIDE 10 MG/ML IJ SOLN
40.0000 mg | Freq: Once | INTRAMUSCULAR | Status: AC
  Administered 2022-04-13: 40 mg via INTRAVENOUS
  Filled 2022-04-13: qty 4

## 2022-04-13 MED ORDER — FINASTERIDE 5 MG PO TABS
5.0000 mg | ORAL_TABLET | Freq: Every day | ORAL | Status: DC
  Administered 2022-04-13 – 2022-04-16 (×4): 5 mg via ORAL
  Filled 2022-04-13 (×4): qty 1

## 2022-04-13 MED ORDER — LATANOPROST 0.005 % OP SOLN
1.0000 [drp] | Freq: Every day | OPHTHALMIC | Status: DC
  Administered 2022-04-14 – 2022-04-15 (×3): 1 [drp] via OPHTHALMIC
  Filled 2022-04-13: qty 2.5

## 2022-04-13 MED ORDER — ACETAMINOPHEN 325 MG PO TABS: 650.0000 mg | ORAL_TABLET | Freq: Four times a day (QID) | ORAL | Status: DC | PRN

## 2022-04-13 MED ORDER — DORZOLAMIDE HCL-TIMOLOL MAL 2-0.5 % OP SOLN
1.0000 [drp] | Freq: Two times a day (BID) | OPHTHALMIC | Status: DC
  Administered 2022-04-14 – 2022-04-16 (×6): 1 [drp] via OPHTHALMIC
  Filled 2022-04-13: qty 10

## 2022-04-13 MED ORDER — ACETAMINOPHEN 650 MG RE SUPP: 650.0000 mg | Freq: Four times a day (QID) | RECTAL | Status: DC | PRN

## 2022-04-13 MED ORDER — BUDESON-GLYCOPYRROL-FORMOTEROL 160-9-4.8 MCG/ACT IN AERO: 2.0000 | INHALATION_SPRAY | Freq: Every day | RESPIRATORY_TRACT | Status: DC | PRN

## 2022-04-13 MED ORDER — SODIUM CHLORIDE 0.9% FLUSH
3.0000 mL | Freq: Two times a day (BID) | INTRAVENOUS | Status: DC
  Administered 2022-04-13 – 2022-04-16 (×4): 3 mL via INTRAVENOUS

## 2022-04-13 MED ORDER — GLYCOPYRROLATE 1 MG PO TABS
1.0000 mg | ORAL_TABLET | Freq: Two times a day (BID) | ORAL | Status: DC
  Administered 2022-04-13 – 2022-04-16 (×6): 1 mg via ORAL
  Filled 2022-04-13 (×9): qty 1

## 2022-04-13 MED ORDER — ONDANSETRON HCL 4 MG/2ML IJ SOLN: 4.0000 mg | Freq: Four times a day (QID) | INTRAMUSCULAR | Status: DC | PRN

## 2022-04-13 MED ORDER — DOCUSATE SODIUM 100 MG PO CAPS
100.0000 mg | ORAL_CAPSULE | Freq: Two times a day (BID) | ORAL | Status: DC
  Administered 2022-04-13 – 2022-04-16 (×7): 100 mg via ORAL
  Filled 2022-04-13 (×7): qty 1

## 2022-04-13 MED ORDER — LEVETIRACETAM 500 MG PO TABS
500.0000 mg | ORAL_TABLET | Freq: Two times a day (BID) | ORAL | Status: DC
  Administered 2022-04-13 – 2022-04-16 (×7): 500 mg via ORAL
  Filled 2022-04-13 (×7): qty 1

## 2022-04-13 MED ORDER — ENOXAPARIN SODIUM 40 MG/0.4ML IJ SOSY
40.0000 mg | PREFILLED_SYRINGE | Freq: Every day | INTRAMUSCULAR | Status: DC
Start: 2022-04-13 — End: 2022-04-14
  Administered 2022-04-13 – 2022-04-14 (×2): 40 mg via SUBCUTANEOUS
  Filled 2022-04-13 (×2): qty 0.4

## 2022-04-13 MED ORDER — SODIUM ZIRCONIUM CYCLOSILICATE 10 G PO PACK
10.0000 g | PACK | Freq: Two times a day (BID) | ORAL | Status: AC
  Administered 2022-04-14: 10 g via ORAL
  Filled 2022-04-13: qty 1

## 2022-04-13 MED ORDER — DEXTROSE 50 % IV SOLN
1.0000 | Freq: Once | INTRAVENOUS | Status: AC
  Administered 2022-04-13: 50 mL via INTRAVENOUS
  Filled 2022-04-13: qty 50

## 2022-04-13 MED ORDER — DORZOLAMIDE HCL-TIMOLOL MAL 2-0.5 % OP SOLN
1.0000 [drp] | Freq: Two times a day (BID) | OPHTHALMIC | Status: DC
Start: 2022-04-13 — End: 2022-04-13

## 2022-04-13 MED ORDER — METOPROLOL SUCCINATE ER 50 MG PO TB24
50.0000 mg | ORAL_TABLET | Freq: Every day | ORAL | Status: DC
  Administered 2022-04-14 – 2022-04-15 (×3): 50 mg via ORAL
  Filled 2022-04-13 (×3): qty 1

## 2022-04-13 MED ORDER — SODIUM CHLORIDE 0.9 % IV BOLUS
1000.0000 mL | Freq: Once | INTRAVENOUS | Status: AC
  Administered 2022-04-13: 1000 mL via INTRAVENOUS

## 2022-04-13 MED ORDER — UMECLIDINIUM BROMIDE 62.5 MCG/ACT IN AEPB
2.0000 | INHALATION_SPRAY | Freq: Every day | RESPIRATORY_TRACT | Status: DC
Start: 2022-04-13 — End: 2022-04-16
  Administered 2022-04-14 – 2022-04-16 (×3): 2 via RESPIRATORY_TRACT
  Filled 2022-04-13: qty 7

## 2022-04-13 MED ORDER — BRIMONIDINE TARTRATE 0.2 % OP SOLN
1.0000 [drp] | Freq: Three times a day (TID) | OPHTHALMIC | Status: DC
  Administered 2022-04-14 – 2022-04-16 (×9): 1 [drp] via OPHTHALMIC
  Filled 2022-04-13: qty 5

## 2022-04-13 MED ORDER — AMLODIPINE BESYLATE 10 MG PO TABS
10.0000 mg | ORAL_TABLET | Freq: Every day | ORAL | Status: DC
  Administered 2022-04-13 – 2022-04-16 (×4): 10 mg via ORAL
  Filled 2022-04-13 (×3): qty 1
  Filled 2022-04-13: qty 2

## 2022-04-13 MED ORDER — POLYETHYLENE GLYCOL 3350 17 G PO PACK: 17.0000 g | PACK | Freq: Every day | ORAL | Status: DC | PRN

## 2022-04-13 MED ORDER — OXYCODONE HCL 5 MG PO TABS
5.0000 mg | ORAL_TABLET | ORAL | Status: DC | PRN
  Administered 2022-04-15: 5 mg via ORAL
  Filled 2022-04-13: qty 1

## 2022-04-13 MED ORDER — INSULIN ASPART 100 UNIT/ML IV SOLN
5.0000 [IU] | Freq: Once | INTRAVENOUS | Status: AC
  Administered 2022-04-13: 5 [IU] via INTRAVENOUS

## 2022-04-13 MED ORDER — AZITHROMYCIN 500 MG PO TABS
500.0000 mg | ORAL_TABLET | ORAL | Status: DC
  Administered 2022-04-14: 500 mg via ORAL
  Filled 2022-04-13: qty 1

## 2022-04-13 MED ORDER — TAMSULOSIN HCL 0.4 MG PO CAPS
0.4000 mg | ORAL_CAPSULE | Freq: Every day | ORAL | Status: DC
  Administered 2022-04-13 – 2022-04-16 (×4): 0.4 mg via ORAL
  Filled 2022-04-13 (×4): qty 1

## 2022-04-13 MED ORDER — LACTATED RINGERS IV SOLN: INTRAVENOUS | Status: DC

## 2022-04-13 MED ORDER — ONDANSETRON HCL 4 MG PO TABS: 4.0000 mg | ORAL_TABLET | Freq: Four times a day (QID) | ORAL | Status: DC | PRN

## 2022-04-13 MED ORDER — SODIUM ZIRCONIUM CYCLOSILICATE 10 G PO PACK
10.0000 g | PACK | Freq: Once | ORAL | Status: AC
Start: 2022-04-13 — End: 2022-04-13
  Administered 2022-04-13: 10 g via ORAL
  Filled 2022-04-13: qty 1

## 2022-04-13 MED ORDER — DEXTROSE 10 % IV SOLN: Freq: Once | INTRAVENOUS | Status: AC

## 2022-04-13 MED ORDER — HYDRALAZINE HCL 20 MG/ML IJ SOLN: 5.0000 mg | INTRAMUSCULAR | Status: DC | PRN

## 2022-04-13 MED ORDER — PANTOPRAZOLE SODIUM 40 MG IV SOLR
INTRAVENOUS | Status: AC
  Administered 2022-04-13: 40 mg
  Filled 2022-04-13: qty 10

## 2022-04-13 MED ORDER — MONTELUKAST SODIUM 10 MG PO TABS
10.0000 mg | ORAL_TABLET | Freq: Every day | ORAL | Status: DC
  Administered 2022-04-14 – 2022-04-15 (×3): 10 mg via ORAL
  Filled 2022-04-13 (×3): qty 1

## 2022-04-13 MED ORDER — ALBUTEROL SULFATE (2.5 MG/3ML) 0.083% IN NEBU: 2.5000 mg | INHALATION_SOLUTION | RESPIRATORY_TRACT | Status: DC | PRN

## 2022-04-13 MED ORDER — NICOTINE 14 MG/24HR TD PT24
14.0000 mg | MEDICATED_PATCH | Freq: Every day | TRANSDERMAL | Status: DC
  Administered 2022-04-13 – 2022-04-16 (×4): 14 mg via TRANSDERMAL
  Filled 2022-04-13 (×4): qty 1

## 2022-04-13 MED ORDER — BISACODYL 5 MG PO TBEC: 5.0000 mg | DELAYED_RELEASE_TABLET | Freq: Every day | ORAL | Status: DC | PRN

## 2022-04-13 MED ORDER — HYDRALAZINE HCL 10 MG PO TABS
10.0000 mg | ORAL_TABLET | Freq: Three times a day (TID) | ORAL | Status: DC
  Administered 2022-04-13 – 2022-04-16 (×10): 10 mg via ORAL
  Filled 2022-04-13 (×10): qty 1

## 2022-04-13 MED ORDER — FLUTICASONE FUROATE-VILANTEROL 200-25 MCG/ACT IN AEPB
2.0000 | INHALATION_SPRAY | Freq: Every day | RESPIRATORY_TRACT | Status: DC
Start: 2022-04-13 — End: 2022-04-16
  Administered 2022-04-14 – 2022-04-16 (×3): 2 via RESPIRATORY_TRACT
  Filled 2022-04-13: qty 28

## 2022-04-13 MED ORDER — LORATADINE 10 MG PO TABS
10.0000 mg | ORAL_TABLET | Freq: Every day | ORAL | Status: DC
  Administered 2022-04-13 – 2022-04-16 (×4): 10 mg via ORAL
  Filled 2022-04-13 (×4): qty 1

## 2022-04-13 MED ORDER — MORPHINE SULFATE (PF) 2 MG/ML IV SOLN: 2.0000 mg | INTRAVENOUS | Status: DC | PRN

## 2022-04-13 MED ORDER — SODIUM CHLORIDE 0.9 % IV BOLUS
500.0000 mL | Freq: Once | INTRAVENOUS | Status: AC
  Administered 2022-04-13: 500 mL via INTRAVENOUS

## 2022-04-13 NOTE — ED Notes (Signed)
Pt continues to sleep, NAD noted, wife at the bedside, side rails up x2 for safety, care on going, will continue to monitor.

## 2022-04-13 NOTE — ED Notes (Signed)
Plan downtime

## 2022-04-13 NOTE — Evaluation (Signed)
Physical Therapy Evaluation Patient Details Name: Darrell [name] MRN: [id number] DOB: 1950/12/25 Today's Date: 04/13/2022  History of Present Illness  Pt is a 71 y/o male admitted secondary to nausea/vomiting. Found to have hyperkalemia. PMH includes ALS, glaucoma, HLD, CVA with dysphagia, HTN, seizure d/o, and pacemaker placement.  Clinical Impression  Pt admitted secondary to problem above with deficits below. Pt requiring max to total A for rolling this session. Pt's wife present and reports pt is total A at baseline. Educated about using hoyer to increased safety. Pt's wife also reports pt's trunk control is poor and it's getting harder to use a standard WC. Pt would benefit from high back custom wheelchair for increased safety with transportation. Would also benefit from air overlay mattress for his hospital bed at home to prevent pressure injuries. Given pt is close to baseline, no further skilled PT needs at this time. Will sign off. If needs change, please re-consult.        Recommendations for follow up therapy are one component of a multi-disciplinary discharge planning process, led by the attending physician.  Recommendations may be updated based on patient status, additional functional criteria and insurance authorization.  Follow Up Recommendations No PT follow up      Assistance Recommended at Discharge Frequent or constant Supervision/Assistance  Patient can return home with the following  Two people to help with walking and/or transfers;Two people to help with bathing/dressing/bathroom;Assistance with cooking/housework;Assistance with feeding;Direct supervision/assist for financial management;Direct supervision/assist for medications management;Assist for transportation;Help with stairs or ramp for entrance    Equipment Recommendations Other (comment) (hoyer lift with pad; air mattress for hospital bed; high back custom wheel chair as pt with limited trunk control)   Recommendations for Other Services       Functional Status Assessment Patient has had a recent decline in their functional status and demonstrates the ability to make significant improvements in function in a reasonable and predictable amount of time.     Precautions / Restrictions Precautions Precautions: Fall Restrictions Weight Bearing Restrictions: No      Mobility  Bed Mobility Overal bed mobility: Needs Assistance Bed Mobility: Rolling Rolling: Max assist, Total assist         General bed mobility comments: total A to roll to R and max A to roll to the L to change brief and linens. Able to assist some with RUE to assist with rolling to the L.    Transfers                        Ambulation/Gait                  Stairs            Wheelchair Mobility    Modified Rankin (Stroke Patients Only)       Balance                                             Pertinent Vitals/Pain Pain Assessment Pain Assessment: Faces Faces Pain Scale: No hurt    Home Living Family/patient expects to be discharged to:: Private residence Living Arrangements: Spouse/significant other Available Help at Discharge: Family;Available 24 hours/day Type of Home: House Home Access: Ramped entrance       Home Layout: One level Home Equipment: Wheelchair - power;Wheelchair - manual;Hospital bed  Prior Function Prior Level of Function : Needs assist             Mobility Comments: Total A for transfers OOB. Pt's wife reports her brother usually picks him up and puts him in his chair. ADLs Comments: Total A for ADLs.     Hand Dominance        Extremity/Trunk Assessment   Upper Extremity Assessment Upper Extremity Assessment: RUE deficits/detail;LUE deficits/detail RUE Deficits / Details: Generally weak, grossly 3/5 throughout LUE Deficits / Details: Finger flexion contractures, limited shoulder flexion. Elbow flexion/extension  grossly 2/5.    Lower Extremity Assessment Lower Extremity Assessment: Generalized weakness;RLE deficits/detail;LLE deficits/detail RLE Deficits / Details: Required assist to perform heel slide. Ankle able to be ranged to neutral DF. LLE Deficits / Details: Required assist to perform heel slide. Ankle able to be ranged to neutral DF.       Communication   Communication: Expressive difficulties (very soft spoken)  Cognition Arousal/Alertness: Awake/alert Behavior During Therapy: Flat affect Overall Cognitive Status: History of cognitive impairments - at baseline                                          General Comments General comments (skin integrity, edema, etc.): Pt's wife present. Discussed use of hoyer for increased safety.    Exercises     Assessment/Plan    PT Assessment Patient does not need any further PT services  PT Problem List         PT Treatment Interventions      PT Goals (Current goals can be found in the Care Plan section)  Acute Rehab PT Goals Patient Stated Goal: per wife, for pt to return home PT Goal Formulation: With patient/family Time For Goal Achievement: 04/13/22 Potential to Achieve Goals: Fair    Frequency       Co-evaluation               AM-PAC PT "6 Clicks" Mobility  Outcome Measure Help needed turning from your back to your side while in a flat bed without using bedrails?: Total Help needed moving from lying on your back to sitting on the side of a flat bed without using bedrails?: Total Help needed moving to and from a bed to a chair (including a wheelchair)?: Total Help needed standing up from a chair using your arms (e.g., wheelchair or bedside chair)?: Total Help needed to walk in hospital room?: Total Help needed climbing 3-5 steps with a railing? : Total 6 Click Score: 6    End of Session   Activity Tolerance: Patient tolerated treatment well Patient left: in bed;with family/visitor present (on  stretcher in hallway in ED) Nurse Communication: Mobility status PT Visit Diagnosis: Muscle weakness (generalized) (M62.81)    Time: 9163-8466 PT Time Calculation (min) (ACUTE ONLY): 31 min   Charges:   PT Evaluation $PT Eval Moderate Complexity: 1 Mod PT Treatments $Therapeutic Activity: 8-22 mins        Lou Miner, DPT  Acute Rehabilitation Services  Office: [phone]   Rudean Hitt 04/13/2022, 4:23 PM

## 2022-04-13 NOTE — ED Notes (Signed)
Epic back up from down time

## 2022-04-13 NOTE — Progress Notes (Signed)
[facility] ED H020 [facility]) Hospital Liaison note:  This patient is currently enrolled in [facility] outpatient-based Palliative Care. Will continue to follow for disposition.  Please call with any outpatient palliative questions or concerns.  Thank you, Lorelee Market, LPN [facility] Liaison [phone]

## 2022-04-13 NOTE — ED Notes (Signed)
Pt appears to be sleeping, even RR and unlabored, NAD noted, wife at bedside, pt within view of nursing staff, side rails up x2 for safety, care on going, will continue to monitor.

## 2022-04-13 NOTE — ED Provider Notes (Signed)
12:14 AM Assumed care from Dr. Alvino Chapel, please see their note for full history, physical and decision making until this point. In brief this is a 71 y.o. year old male who presented to the ED tonight with Nausea and Emesis     H/o ALS. Here with mild dehydration and hyper-k. Tolerating fluids. Pending urine. Dispo pending PO tolerance and urine.   Given a couple liters of fluid and recheck potassium and it went up.  Kidney function slightly improved.  EKG without changes but went and treated for hyperkalemia and admitted to hospitalist.  Labs, studies and imaging reviewed by myself and considered in medical decision making if ordered. Imaging interpreted by radiology.  Labs Reviewed  COMPREHENSIVE METABOLIC PANEL - Abnormal; Notable for the following components:      Result Value   Potassium 5.6 (*)    Glucose, Bld 142 (*)    BUN 45 (*)    Creatinine, Ser 3.01 (*)    Albumin 3.4 (*)    AST 12 (*)    GFR, Estimated 22 (*)    All other components within normal limits  CBC - Abnormal; Notable for the following components:   WBC 15.9 (*)    RBC 4.02 (*)    Hemoglobin 11.4 (*)    HCT 35.2 (*)    All other components within normal limits  LIPASE, BLOOD  URINALYSIS, ROUTINE W REFLEX MICROSCOPIC    DG Abdomen Acute W/Chest  Final Result      No follow-ups on file.    Treshawn Allen, Corene Cornea, MD 04/13/22 [phone]

## 2022-04-13 NOTE — Progress Notes (Signed)
SLP Cancellation Note  Patient Details Name: Darrell [name] MRN: [id number] DOB: 1950/12/11   Cancelled treatment:       Reason Eval/Treat Not Completed: Patient's level of consciousness. Patient asleep and unable to arouse. His wife was at bedside and SLP spoke with her regarding his recent and current performance with PO intake. She reported that lately he has been wanting mainly liquids but he is able to drink with a straw or cup. She has been thickening his liquids to honey thick consistency as was recommended after MBS during hospitalization last month. She indicated that he did not eat much at all of meal tray which is pureed as she thinks it is "too thick". SLP in agreement with wife that if patient is having difficulty swallowing puree solids because of the consistency, can focus on liquid nutrition (honey thick) at this time to maximize PO intake. SLP will plan to return next date to complete formal bedside swallow evaluation when patient is able to be adequately awake and alert.   Sonia Baller, MA, CCC-SLP Speech Therapy

## 2022-04-13 NOTE — H&P (Signed)
History and Physical    Patient: Darrell [name] GLO:[id number] DOB: [birth year] DOA: 04/12/2022 DOS: the patient was seen and examined on 04/13/2022 PCP: [name], MD  Patient coming from: Home - lives with wife; NOK: Wife, Kyrillos Adams, [phone]   Chief Complaint: n/v/d  HPI: Darrell [name] is a 71 y.o. male with medical history significant of ALS; stage 3 CKD;  glaucoma; HLD; h/o CVA with dysphagia; HTN; seizure d/o; and pacemaker placement presenting with n/v/d.   His wife reports that he had acute onset of 3 episodes of vomiting yesterday, none since presentation to the ER.  He usually has loose stools and these are unchanged.  The patient has ALS and has difficulty answering questions.  His wife reports that he has been taking PO ok.    ER Course:  Carryover, per Dr. Bridgett Larsson:  71 year old African-American male history of ALS, CKD stage IIIb-IV seen for nausea vomiting and hyperkalemia(5.6) .  Hydrated in ER.  Repeat potassium still not decreased(5.7).  EDP requesting admission to monitor patient's serum potassium.      Review of Systems: unable to review all systems due to the inability of the patient to answer questions. Past Medical History:  Diagnosis Date   Arthritis    At high risk for falls 08/16/2015   CIDP (chronic inflammatory demyelinating polyneuropathy) ([name])    CKD (chronic kidney disease) stage 3, GFR 30-59 ml/min ([place]) 08/16/2015   Dysphagia as late effect of cerebrovascular disease    pts wife states pt has to eat soft foods    Elevated liver enzymes 08/10/2016   GERD (gastroesophageal reflux disease)    Glaucoma    High cholesterol    History of CVA with residual deficit 03/25/2013   Hypertension    Hypertensive retinopathy of both eyes 01/16/2017   Inguinal hernia 03/25/2013   Liver hemangioma 08/14/2016   New onset seizure ([employer]) 07/08/2017   seizure 07/14/18   Nuclear sclerosis of both eyes 10/25/2016   Primary open angle glaucoma of both eyes,  indeterminate stage 10/25/2016   Renal mass, right 08/14/2016   Status cardiac pacemaker 01/29/2017   Placed for second degree heart block on 01/16/17 Medtronic Azure XT DR MRI SureScan dual-chamber pacemaker   Stroke [facility])    2011 with residual deficit left sided weakness   Tobacco dependence    Past Surgical History:  Procedure Laterality Date   EYE SURGERY     HERNIA REPAIR     INGUINAL HERNIA REPAIR Right 11/23/2014   Procedure: right inguinal hernia repair with mesh;  Surgeon: Armandina Gemma, MD;  Location: [facility];  Service: General;  Laterality: Right;   INSERTION OF MESH N/A 11/23/2014   Procedure: INSERTION OF MESH;  Surgeon: Armandina Gemma, MD;  Location: [facility];  Service: General;  Laterality: N/A;   MASS EXCISION Left 08/29/2017   Procedure: EXCISION OF LEFT NECK MASS;  Surgeon: Coralie Keens, MD;  Location: [place];  Service: General;  Laterality: Left;   PACEMAKER IMPLANT N/A 01/16/2017   Procedure: Pacemaker Implant;  Surgeon: Will Meredith Leeds, MD;  Location: [place] CV LAB;  Service: Cardiovascular;  Laterality: N/A;   SHOULDER SURGERY Bilateral 1988, 1998   Social History:  reports that he has been smoking cigarettes. He has a 40.00 pack-year smoking history. He has never used smokeless tobacco. He reports that he does not drink alcohol and does not use drugs.  Allergies  Allergen Reactions   Ace Inhibitors Other (See Comments)    Hyperkalemia  Doxycycline Other (See Comments)    Hiccups, cough, nausea and emesis, elevated liver enzymes, elevated eosinophils, SOB concerning for early DRESS syndrome    Atacand Hct [Candesartan Cilexetil-Hctz] Hives   Shellfish Allergy Hives    Family History  Problem Relation Age of Onset   Hypertension Mother    Diabetes Sister    Hypertension Sister    Cancer Sister        1 sister   COPD Sister        in 1 sister   Stomach cancer Neg Hx    Colon cancer Neg Hx    Pancreatic cancer Neg Hx    Esophageal cancer Neg Hx      Prior to Admission medications   Medication Sig Start Date End Date Taking? Authorizing Provider  albuterol (VENTOLIN HFA) 108 (90 Base) MCG/ACT inhaler Inhale 2 puffs into the lungs every 6 (six) hours as needed for up to 30 days for Wheezing. Patient taking differently: Inhale 2 puffs into the lungs as needed for wheezing or shortness of breath. 06/08/21   Argentina Donovan, PA-C  amLODipine (NORVASC) 10 MG tablet Take 1 tablet by mouth once daily Patient taking differently: Take 10 mg by mouth daily. 03/23/22   [name], MD  atorvastatin (LIPITOR) 40 MG tablet Take 1 tablet (40 mg total) by mouth daily. 10/27/21   [name], MD  azithromycin (ZITHROMAX) 500 MG tablet Take 1 tablet (500 mg total) by mouth See admin instructions. Take 1 tablet by mouth Monday,Wednesday and Friday 03/29/22   Little Ishikawa, MD  brimonidine [facility]) 0.2 % ophthalmic solution Place 1 drop into both eyes 3 (three) times daily. 07/25/21   [provider]  Budeson-Glycopyrrol-Formoterol (BREZTRI AEROSPHERE) 160-9-4.8 MCG/ACT AERO Inhale 2 puffs into the lungs 2 (two) times daily. Patient taking differently: Inhale 2 puffs into the lungs daily as needed (SOB, wheezing). 08/01/21   [name], MD  cefdinir (OMNICEF) 300 MG capsule Take 1 capsule (300 mg total) by mouth daily. 03/25/22   Little Ishikawa, MD  cetirizine (ZYRTEC) 10 MG tablet Take 1 tablet (10 mg total) by mouth daily. 01/24/22   [name], MD  dorzolamide-timolol (COSOPT) 22.3-6.8 MG/ML ophthalmic solution Place 1 drop into both eyes 2 (two) times daily.    [provider]  ferrous sulfate (FEROSUL) 325 (65 FE) MG tablet Take 1 tablet (325 mg total) by mouth 2 (two) times daily with a meal. 06/08/21   McClung, Dionne Bucy, PA-C  finasteride (PROSCAR) 5 MG tablet Take 5 mg by mouth daily. 12/13/21   [provider]  fluticasone (FLONASE) 50 MCG/ACT nasal spray Place 2 sprays into both nostrils  daily. Patient taking differently: Place 2 sprays into both nostrils daily as needed for allergies. 01/12/21   Argentina Donovan, PA-C  glycopyrrolate (ROBINUL) 1 MG tablet Take 1 tablet (1 mg total) by mouth 2 times daily. 10/27/21   [name], MD  hydrALAZINE (APRESOLINE) 10 MG tablet Take 1 tablet (10 mg total) by mouth 3 (three) times daily. 01/24/22   [name], MD  latanoprost (XALATAN) 0.005 % ophthalmic solution Place 1 drop into both eyes at bedtime. 07/25/21   [provider]  levETIRAcetam (KEPPRA) 500 MG tablet Take 1 tablet (500 mg total) by mouth 2 (two) times daily. 12/19/21   Penumalli, Earlean Polka, MD  lidocaine (LIDODERM) 5 % Apply patch to painful area. Patch may remain in place for up to 12 hours in a 24 hour period. Patient taking  differently: Place 1 patch onto the skin daily as needed (for pain). 06/08/21   Argentina Donovan, PA-C  metoprolol succinate (TOPROL-XL) 50 MG 24 hr tablet Take 1 tablet (50 mg total) by mouth at bedtime. Take with or immediately following a meal. 04/04/22   Camnitz, Ocie Doyne, MD  Misc. Devices MISC Yankauer suction. Dx: ALS 11/11/19   [name], MD  Misc. Devices MISC Coughalator 04/01/20   [name], MD  Misc. Devices MISC Nebulizer machine.  Diagnosis- Motor neuron disease 04/01/20   [name], MD  Misc. [place] [facility] Bed JJO84.16 06/01/20   [name], MD  Misc. [place] [facility] bed extension. Diagnosis: ALS. Height 6'4" 06/10/20   [name], MD  Misc. Devices MISC Manual wheelchair.  Diagnosis-ALS 05/04/21   [name], MD  Misc. Devices MISC Right and left wrist splint 12/05/21   [name], MD  montelukast (SINGULAIR) 10 MG tablet Take 10 mg by mouth at bedtime. 08/29/21   [provider]  Multiple Vitamin (MULTI-VITAMIN) tablet Take 1 tablet by mouth daily.    [provider]  polyethylene glycol (MIRALAX) 17 g packet Take 17 g by mouth daily. Patient taking  differently: Take 17 g by mouth daily as needed. 10/11/21   Shelly Coss, MD  tamsulosin (FLOMAX) 0.4 MG CAPS capsule Take 1 capsule (0.4 mg total) by mouth daily. 01/24/22   [name], MD    Physical Exam: Vitals:   04/13/22 0221 04/13/22 0656 04/13/22 1147 04/13/22 1158  BP: (!) 166/90 140/82  (!) 143/81  Pulse: 85 91 67 67  Resp: '17 17  20  '$ Temp: 99.6 F (37.6 C) 99.1 F (37.3 C) 99 F (37.2 C) 99 F (37.2 C)  TempSrc:   Oral Oral  SpO2: 99% 98%  93%   General:  Appears chronic ill, diffusely very weak, soft-spoken and hard to understand Eyes:  EOMI, normal but mildly edematous lids ENT:  grossly normal hearing, lips & tongue, mmm Neck:  no LAD, masses or thyromegaly Cardiovascular:  RRR, no m/r/g. No LE edema.  Respiratory:   CTA bilaterally with no wheezes/rales/rhonchi.  Normal respiratory effort. Abdomen:  soft, NT, ND Skin:  no rash or induration seen on limited exam Musculoskeletal:  diffusely decreased strength with generalized muscular atrophy, no bony abnormality Psychiatric:  flat mood and affect, speech very quiet and hard to understand Neurologic:  unable to effectively perform   Radiological Exams on Admission: Independently reviewed - see discussion in A/P where applicable  DG Abdomen Acute W/Chest  Result Date: 04/12/2022 CLINICAL DATA:  Vomiting, nausea EXAM: DG ABDOMEN ACUTE WITH 1 VIEW CHEST COMPARISON:  03/23/2022 FINDINGS: Supine and upright frontal views of the abdomen and pelvis as well as an upright frontal view of the chest are obtained. Dual lead pacer again noted. Cardiac silhouette is stable. Persistent left basilar consolidation and likely small left effusion. Right chest is clear. No pneumothorax. Bowel gas pattern is unremarkable without obstruction or ileus. Retained oral contrast throughout the colon. Multiple rounded densities projecting over the left upper quadrant likely reflect ingested pills. No free gas in the greater peritoneal sac.  No abdominal masses or abnormal calcifications. IMPRESSION: 1. Unremarkable bowel gas pattern. 2. Stable left basilar consolidation and likely small left effusion. Electronically Signed   By: Randa Ngo M.D.   On: 04/12/2022 21:18    EKG: Independently reviewed.  Paced rhythm with rate 86; IVCD; prolonged QTc 509; nonspecific ST changes with no evidence of acute  ischemia   Labs on Admission: I have personally reviewed the available labs and imaging studies at the time of the admission.  Pertinent labs:    K+ 5.6, 5.7; 5.3 on 6/30 Glucose 151 BUN 45/Creatinine 2.96/GFR 22 - stable WBC 15.9 Hgb 11.4   Assessment and Plan: Principal Problem:   Hyperkalemia Active Problems:   Essential hypertension   Tobacco abuse   History of CVA with residual deficit   Seizure disorder ([name])   Primary open angle glaucoma of both eyes, indeterminate stage   Mixed hyperlipidemia   ALS (amyotrophic lateral sclerosis) ([name])   CKD (chronic kidney disease), stage IV ([name])   DNR (do not resuscitate)    Hyperkalemia -Patient with acute/subacute hyperkalemia which appears to be resulting from AKI due to volume deficiency in setting of n/v -Was given 2.5L IVF, Lasix, insulin/glucose, and Lokelma in ER -K+ remains elevated -Will continue to replete at 75 cc/hour  -Will continue Lokelma x 2 additional doses -Will recheck BNP at 1700 and 0500 -He is not on any obvious K+-sparing medications -Will observe overnight on telemetry  ALS -He has significant generalized muscle weakness but wife denies current respiratory symptoms -PT/OT/ST evaluations  Stage 4 CKD -Appears to be stable at this time -Will follow with ongoing hydration  H/o CVA with dysphagia -Has pacemaker -Current diet is pureed with nectar-thick liquids -Continue Robinul  HTN -Continue amlodipine, hydralazine (TID as prescribed rather than once daily as he is taking it), Toprol XL  HLD -Continue Lipitor  Glaucoma -Continue  brimonidine, Cosopt, latanoprost  Seizure d/o -Continue Keppra  COPD -Continue home meds - Albuterol, Breo, Incruse Ellipta, Zyrtec (Claritin per formulary) -He is also on 3x/week Azithromycin   BPH -Continue Proscar, tamsulosin  Tobacco dependence -Encourage cessation -Patch ordered  DNR -I have discussed code status with the patient's wife; the patient would not desire resuscitation and would prefer to die a natural death should that situation arise. -He will need a gold out of facility DNR form at the time of discharge    Advance Care Planning:   Code Status: DNR   Consults: PT/OT/ST; TOC team; nutrition  DVT Prophylaxis: Lovenox  Family Communication: Wife was present throughout evaluation  Severity of Illness: The appropriate patient status for this patient is OBSERVATION. Observation status is judged to be reasonable and necessary in order to provide the required intensity of service to ensure the patient's safety. The patient's presenting symptoms, physical exam findings, and initial radiographic and laboratory data in the context of their medical condition is felt to place them at decreased risk for further clinical deterioration. Furthermore, it is anticipated that the patient will be medically stable for discharge from the hospital within 2 midnights of admission.   Author: Karmen Bongo, MD 04/13/2022 3:52 PM  For on call review [email or web address].

## 2022-04-14 ENCOUNTER — Encounter (HOSPITAL_COMMUNITY): Payer: Self-pay | Admitting: Internal Medicine

## 2022-04-14 DIAGNOSIS — R112 Nausea with vomiting, unspecified: Secondary | ICD-10-CM | POA: Diagnosis not present

## 2022-04-14 DIAGNOSIS — I129 Hypertensive chronic kidney disease with stage 1 through stage 4 chronic kidney disease, or unspecified chronic kidney disease: Secondary | ICD-10-CM | POA: Diagnosis not present

## 2022-04-14 DIAGNOSIS — G1221 Amyotrophic lateral sclerosis: Secondary | ICD-10-CM | POA: Diagnosis not present

## 2022-04-14 DIAGNOSIS — Z91013 Allergy to seafood: Secondary | ICD-10-CM | POA: Diagnosis not present

## 2022-04-14 DIAGNOSIS — K219 Gastro-esophageal reflux disease without esophagitis: Secondary | ICD-10-CM | POA: Diagnosis not present

## 2022-04-14 DIAGNOSIS — D631 Anemia in chronic kidney disease: Secondary | ICD-10-CM | POA: Diagnosis not present

## 2022-04-14 DIAGNOSIS — Z833 Family history of diabetes mellitus: Secondary | ICD-10-CM | POA: Diagnosis not present

## 2022-04-14 DIAGNOSIS — E875 Hyperkalemia: Secondary | ICD-10-CM | POA: Diagnosis not present

## 2022-04-14 DIAGNOSIS — N401 Enlarged prostate with lower urinary tract symptoms: Secondary | ICD-10-CM | POA: Diagnosis not present

## 2022-04-14 DIAGNOSIS — Z66 Do not resuscitate: Secondary | ICD-10-CM | POA: Diagnosis not present

## 2022-04-14 DIAGNOSIS — G40909 Epilepsy, unspecified, not intractable, without status epilepticus: Secondary | ICD-10-CM | POA: Diagnosis not present

## 2022-04-14 DIAGNOSIS — Z950 Presence of cardiac pacemaker: Secondary | ICD-10-CM | POA: Diagnosis not present

## 2022-04-14 DIAGNOSIS — I69391 Dysphagia following cerebral infarction: Secondary | ICD-10-CM | POA: Diagnosis not present

## 2022-04-14 DIAGNOSIS — E782 Mixed hyperlipidemia: Secondary | ICD-10-CM | POA: Diagnosis not present

## 2022-04-14 DIAGNOSIS — Z825 Family history of asthma and other chronic lower respiratory diseases: Secondary | ICD-10-CM | POA: Diagnosis not present

## 2022-04-14 DIAGNOSIS — I69354 Hemiplegia and hemiparesis following cerebral infarction affecting left non-dominant side: Secondary | ICD-10-CM | POA: Diagnosis not present

## 2022-04-14 DIAGNOSIS — Z8249 Family history of ischemic heart disease and other diseases of the circulatory system: Secondary | ICD-10-CM | POA: Diagnosis not present

## 2022-04-14 DIAGNOSIS — J449 Chronic obstructive pulmonary disease, unspecified: Secondary | ICD-10-CM | POA: Diagnosis not present

## 2022-04-14 DIAGNOSIS — G6181 Chronic inflammatory demyelinating polyneuritis: Secondary | ICD-10-CM | POA: Diagnosis not present

## 2022-04-14 DIAGNOSIS — H401134 Primary open-angle glaucoma, bilateral, indeterminate stage: Secondary | ICD-10-CM | POA: Diagnosis not present

## 2022-04-14 DIAGNOSIS — N184 Chronic kidney disease, stage 4 (severe): Secondary | ICD-10-CM

## 2022-04-14 DIAGNOSIS — R338 Other retention of urine: Secondary | ICD-10-CM | POA: Diagnosis not present

## 2022-04-14 DIAGNOSIS — F17210 Nicotine dependence, cigarettes, uncomplicated: Secondary | ICD-10-CM | POA: Diagnosis not present

## 2022-04-14 DIAGNOSIS — Z888 Allergy status to other drugs, medicaments and biological substances status: Secondary | ICD-10-CM | POA: Diagnosis not present

## 2022-04-14 DIAGNOSIS — Z881 Allergy status to other antibiotic agents status: Secondary | ICD-10-CM | POA: Diagnosis not present

## 2022-04-14 LAB — CBC
HCT: 27.9 % — ABNORMAL LOW (ref 39.0–52.0)
Hemoglobin: 9.1 g/dL — ABNORMAL LOW (ref 13.0–17.0)
MCH: 28.9 pg (ref 26.0–34.0)
MCHC: 32.6 g/dL (ref 30.0–36.0)
MCV: 88.6 fL (ref 80.0–100.0)
Platelets: 196 10*3/uL (ref 150–400)
RBC: 3.15 MIL/uL — ABNORMAL LOW (ref 4.22–5.81)
RDW: 14 % (ref 11.5–15.5)
WBC: 11.7 10*3/uL — ABNORMAL HIGH (ref 4.0–10.5)
nRBC: 0 % (ref 0.0–0.2)

## 2022-04-14 LAB — BASIC METABOLIC PANEL
Anion gap: 7 (ref 5–15)
BUN: 46 mg/dL — ABNORMAL HIGH (ref 8–23)
CO2: 20 mmol/L — ABNORMAL LOW (ref 22–32)
Calcium: 9.2 mg/dL (ref 8.9–10.3)
Chloride: 113 mmol/L — ABNORMAL HIGH (ref 98–111)
Creatinine, Ser: 3.06 mg/dL — ABNORMAL HIGH (ref 0.61–1.24)
GFR, Estimated: 21 mL/min — ABNORMAL LOW (ref >60)
Glucose, Bld: 103 mg/dL — ABNORMAL HIGH (ref 70–99)
Potassium: 5.3 mmol/L — ABNORMAL HIGH (ref 3.5–5.1)
Sodium: 140 mmol/L (ref 135–145)

## 2022-04-14 MED ORDER — ENOXAPARIN SODIUM 30 MG/0.3ML IJ SOSY
30.0000 mg | PREFILLED_SYRINGE | Freq: Every day | INTRAMUSCULAR | Status: DC
  Administered 2022-04-15 – 2022-04-16 (×2): 30 mg via SUBCUTANEOUS
  Filled 2022-04-14 (×2): qty 0.3

## 2022-04-14 MED ORDER — SODIUM ZIRCONIUM CYCLOSILICATE 10 G PO PACK
10.0000 g | PACK | Freq: Once | ORAL | Status: AC
Start: 2022-04-14 — End: 2022-04-14
  Administered 2022-04-14: 10 g via ORAL
  Filled 2022-04-14: qty 1

## 2022-04-14 MED ORDER — ADULT MULTIVITAMIN W/MINERALS CH
1.0000 | ORAL_TABLET | Freq: Every day | ORAL | Status: DC
  Administered 2022-04-14 – 2022-04-16 (×3): 1 via ORAL
  Filled 2022-04-14 (×3): qty 1

## 2022-04-14 NOTE — Progress Notes (Signed)
 Initial Nutrition Assessment  DOCUMENTATION CODES:   Not applicable; Pt is at very high nutritional risk but based on current information available, pt does not meet clinical characteristics for malnutrition  INTERVENTION:   Magic cup TID with meals, each supplement provides 290 kcal and 9 grams of protein  Feeding assistance at meal times  MVI with Minerals  No recent weight; RD asked RN to attempt to weight pt as able  NUTRITION DIAGNOSIS:   Inadequate oral intake related to acute illness as evidenced by per patient/family report.  GOAL:   Patient will meet greater than or equal to 90% of their needs  MONITOR:   PO intake, Supplement acceptance, Labs, Weight trends  REASON FOR ASSESSMENT:   Consult Assessment of nutrition requirement/status  ASSESSMENT:   71 yo male admitted with N/V, AKI with hyerkalemia. PMH includes ALS, CKD 3, HLD, CVA with dysphagia, HTN, seizure disorder  Wife reports pt ate good breakfast this AM. Lunch arrived just 1.5 hours after lunch and pt was not very hungry but did take some bites. Wife reports pt typically only eats 2 good meals per day, Breakfast and Dinner. Sometimes he might eat something small for lunch but does snack some.   Wife reports pt can self-feed but it is a challenge for him. Recommend pt receive assistance as needed to promote po intake.   Wife also reports pt drinks Ensure/Boost at home but not every day. Wife reports they do not thicken Ensure/Boost at home, nor do they thicken water. Wife reports all other liquids are thickened. RD reached out to SLP to make sure they were are of this.   Discussed possibility of utilizing Magic Cup as oral nutrition supplement. Open to trying these as the are pudding thick at room temperature but provide extra calories and protein.   No weight this admission. Wife reports she has not weighed him in over a year. Pt used to weigh around   Pt is essentially bed bound.   Labs: potassium  5.3 (H), BUN 46, Creatinine 3.06 Meds: colace, LR at 75 ml/hr, lokelma  NUTRITION - FOCUSED PHYSICAL EXAM:  Flowsheet Row Most Recent Value  Orbital Region Moderate depletion  Upper Arm Region Moderate depletion  Thoracic and Lumbar Region Moderate depletion  Buccal Region Moderate depletion  Clavicle Bone Region Moderate depletion  Clavicle and Acromion Bone Region Severe depletion  Scapular Bone Region Severe depletion  Dorsal Hand Severe depletion  Patellar Region Severe depletion  Anterior Thigh Region Severe depletion  Posterior Calf Region Severe depletion  Edema (RD Assessment) Mild       Diet Order:   Diet Order             DIET - DYS 1 Room service appropriate? Yes; Fluid consistency: Honey Thick  Diet effective now                   EDUCATION NEEDS:   Education needs have been addressed  Skin:  Skin Assessment: Skin Integrity Issues: Skin Integrity Issues:: Other (Comment) Other: irritant dermatitis to buttocks  Last BM:  no BM  Height:   Ht Readings from Last 1 Encounters:  03/23/22 '6\' 3"'$  (1.905 m)    Weight:   Wt Readings from Last 1 Encounters:  03/23/22 77.5 kg   BMI:  There is no height or weight on file to calculate BMI.  Estimated Nutritional Needs:   Kcal:  1900-2100 kcals  Protein:  95-105 g  Fluid:  >/= 2L     Rico Junker MS, RDN, LDN, CNSC Registered Dietitian 3 Clinical Nutrition RD Pager and On-Call Pager Number Located in [place]

## 2022-04-14 NOTE — TOC Progression Note (Signed)
Transition of Care [facility]) - Initial/Assessment Note    Patient Details  Name: Darrell [name] MRN: [id number] Date of Birth: May 08, 1951  Transition of Care [facility]) CM/SW Contact:    Milinda Antis, [place] Phone Number: 04/14/2022, 3:56 PM  Clinical Narrative:               Transition of Care Department [facility]) has reviewed patient.  Patient is from home with wife.  PT is not recommending any follow up.    TOC following patient for any d/c planning needs once medically stable.   Lind Covert, MSW, [place]     Patient Goals and CMS Choice        Expected Discharge Plan and Services           Expected Discharge Date: 04/15/22                                    Prior Living Arrangements/Services                       Activities of Daily Living Home Assistive Devices/Equipment: Bedside commode/3-in-1, Gilford Rile (specify type), Hospital bed, Built-in shower seat, Eyeglasses, Wheelchair, Blood pressure cuff ADL Screening (condition at time of admission) Patient's cognitive ability adequate to safely complete daily activities?: Yes (with assistance) Is the patient deaf or have difficulty hearing?: Yes Does the patient have difficulty seeing, even when wearing glasses/contacts?: Yes Does the patient have difficulty concentrating, remembering, or making decisions?: No Patient able to express need for assistance with ADLs?: Yes Does the patient have difficulty dressing or bathing?: Yes Independently performs ADLs?: No Communication: Independent Dressing (OT): Dependent Is this a change from baseline?: Pre-admission baseline Grooming: Dependent Is this a change from baseline?: Pre-admission baseline Feeding: Dependent Is this a change from baseline?: Pre-admission baseline Bathing: Dependent Toileting: Dependent Is this a change from baseline?: Pre-admission baseline In/Out Bed: Dependent Is this a change from baseline?: Pre-admission baseline Walks in Home:  Dependent Is this a change from baseline?: Pre-admission baseline Does the patient have difficulty walking or climbing stairs?: Yes Weakness of Arms/Hands: Both  Permission Sought/Granted                  Emotional Assessment              Admission diagnosis:  Hyperkalemia [E87.5] AKI (acute kidney injury) ([place]) [N17.9] Nausea and vomiting, unspecified vomiting type [R11.2] Patient Active Problem List   Diagnosis Date Noted   DNR (do not resuscitate) 04/13/2022   Community acquired bacterial pneumonia 03/23/2022   Acute respiratory failure with hypoxia ([place]) 10/10/2021   Coffee ground emesis 10/07/2021   Hyperkalemia 10/07/2021   Normocytic anemia 10/07/2021   Bigeminal rhythm 10/07/2021   Fecal impaction ([place])    Acute metabolic encephalopathy 76/16/0737   COVID-19 virus infection 05/06/2021   Anemia, chronic disease 05/06/2021   Benign prostatic hyperplasia 05/06/2021   Ulcer of ankle ([place]) 03/29/2021   CKD (chronic kidney disease), stage IV ([place]) 03/09/2021   Cognitive communication deficit 01/10/2021   Chronic bronchitis ([place]) 05/25/2020   Chronic cough 05/25/2020   Goals of care, counseling/discussion    Palliative care by specialist    ARF (acute renal failure) ([place]) 05/05/2019   Pain due to onychomycosis of toenails of both feet 03/25/2019   Primary lateral sclerosis ([place]) 08/28/2018   ALS (amyotrophic lateral sclerosis) ([place]) 07/24/2018   Dysarthria 05/21/2018   Dysphagia  05/21/2018   Gait abnormality 05/21/2018   Spasticity 05/21/2018   GERD (gastroesophageal reflux disease) 07/21/2017   Mixed hyperlipidemia 07/21/2017   Bradycardia    Seizure disorder ([place]) 07/08/2017   Status cardiac pacemaker 01/29/2017   Unintended weight loss 01/29/2017   Tinea pedis of right foot 01/29/2017   Hypertensive retinopathy of both eyes 01/16/2017   Hypercalcemia 01/15/2017   Heart block 01/15/2017   Nuclear sclerosis of both eyes 10/25/2016   Primary open angle  glaucoma of both eyes, indeterminate stage 10/25/2016   Glaucoma 09/12/2016   Liver hemangioma 08/14/2016   Renal cyst 08/14/2016   Renal mass, right 08/14/2016   Elevated liver enzymes 08/10/2016   Chronic kidney disease, stage 3b ([place]) 08/16/2015   At high risk for falls 08/16/2015   Subcutaneous nodules 08/16/2015   Prediabetes 10/07/2013   Inguinal hernia 03/25/2013   History of CVA with residual deficit 03/25/2013   Essential hypertension 10/09/2012   Tobacco abuse 10/09/2012   PCP:  [name], MD Pharmacy:   [facility] [address]), [place] [address] [place] [address] Phone: [phone] Fax: [phone]     Social Determinants of Health ([place]) Interventions    Readmission Risk Interventions     No data to display

## 2022-04-14 NOTE — Hospital Course (Signed)
71 year old man PMH ALS CKD stage III presenting with nausea, vomiting and diarrhea.  Admitted for same, hyperkalemia.

## 2022-04-14 NOTE — Progress Notes (Signed)
OT Cancellation Note  Patient Details Name: Darrell [name] MRN: [id number] DOB: 10-Nov-1950   Cancelled Treatment:    Reason Eval/Treat Not Completed: OT screened, no needs identified, will sign off: Chart reviewed and noted that pt is total assist with his ADLs at baseline.  Agree with PT's recommendations for home.  OT will sign off due to pt at baseline with poor rehab potential.   Julien Girt 04/14/2022, 8:13 AM

## 2022-04-14 NOTE — Evaluation (Signed)
Clinical/Bedside Swallow Evaluation Patient Details  Name: Darrell [name] MRN: [id number] Date of Birth: [birth year]  Today's Date: 04/14/2022 Time: SLP Start Time (ACUTE ONLY): 1321 SLP Stop Time (ACUTE ONLY): 1340 SLP Time Calculation (min) (ACUTE ONLY): 19 min  Past Medical History:  Past Medical History:  Diagnosis Date   Arthritis    Asthma    At high risk for falls 08/16/2015   CIDP (chronic inflammatory demyelinating polyneuropathy) ([name])    CKD (chronic kidney disease) stage 3, GFR 30-59 ml/min ([place]) 08/16/2015   Dysphagia as late effect of cerebrovascular disease    pts wife states pt has to eat soft foods    Elevated liver enzymes 08/10/2016   GERD (gastroesophageal reflux disease)    Glaucoma    High cholesterol    History of CVA with residual deficit 03/25/2013   Hypertension    Hypertensive retinopathy of both eyes 01/16/2017   Inguinal hernia 03/25/2013   Liver hemangioma 08/14/2016   New onset seizure ([place]) 07/08/2017   seizure 07/14/18   Nuclear sclerosis of both eyes 10/25/2016   Pneumonia    Presence of permanent cardiac pacemaker    Primary open angle glaucoma of both eyes, indeterminate stage 10/25/2016   Renal mass, right 08/14/2016   Status cardiac pacemaker 01/29/2017   Placed for second degree heart block on 01/16/17 Medtronic Azure XT DR MRI SureScan dual-chamber pacemaker   Stroke [facility])    2011 with residual deficit left sided weakness   Tobacco dependence    Past Surgical History:  Past Surgical History:  Procedure Laterality Date   EYE SURGERY     HERNIA REPAIR     INGUINAL HERNIA REPAIR Right 11/23/2014   Procedure: right inguinal hernia repair with mesh;  Surgeon: Armandina Gemma, MD;  Location: [facility];  Service: General;  Laterality: Right;   INSERTION OF MESH N/A 11/23/2014   Procedure: INSERTION OF MESH;  Surgeon: Armandina Gemma, MD;  Location: [facility];  Service: General;  Laterality: N/A;   JOINT REPLACEMENT     MASS EXCISION Left 08/29/2017    Procedure: EXCISION OF LEFT NECK MASS;  Surgeon: Coralie Keens, MD;  Location: [place];  Service: General;  Laterality: Left;   PACEMAKER IMPLANT N/A 01/16/2017   Procedure: Pacemaker Implant;  Surgeon: Will Meredith Leeds, MD;  Location: [place] [facility];  Service: Cardiovascular;  Laterality: N/A;   SHOULDER SURGERY Bilateral 1988, 1998   HPI:  Pt is a 71 y/o male admitted secondary to nausea/vomiting. Found to have hyperkalemia. MBS (03/24/22) revealed moderate phayrngeal dysphagia with aspiration before and during the swallow with thin and NTL. Weak cough response was inconsistent. Recommended dys 1, HTL at that time.PMH: ALS; stage 3 CKD;  glaucoma; HLD; h/o CVA with dysphagia; HTN; seizure d/o; and pacemaker placement.    Assessment / Plan / Recommendation  Clinical Impression  Pt alert and repsoitioned upright in bed for assessment of swallow function. Daughter and additional family member present at bedside, with daughter reporting no known changes in swallow function. She and pt both report consuming pureed solids and HTL at home per recent MBS recommendations. Pt with breathy vocal quality of low intensity, weak volitional cough. With clinician assist for sips/bites, he consumed thin liquids, HTL, bites of puree and regular textured solid without overt s/sx of aspiration. Even with consecutive swallows of thin by straw, no clinical indications of pharyngeal dysphagia noted. Prolonged mastication/bolus formation exhibited with puree and regular textures, but he achieved adequate oral clearance. Pt with recent  hx of silent aspiration per MBS (03/24/22), which was preceded by a BSE (03/24/22) which revealed "no s/s aspiration present with 3 oz water straw presentation". Given hx of worsening dysphagia, recommend continue dys 1/HTL diet, meds crushed in puree. SLP to f/u for tolerance and training in swallow/mealtime strategies.  SLP Visit Diagnosis: Dysphagia, pharyngeal phase (R13.13)     Aspiration Risk  Moderate aspiration risk;Severe aspiration risk    Diet Recommendation Dysphagia 1 (Puree);Honey-thick liquid   Liquid Administration via: Cup;Straw Medication Administration: Crushed with puree Supervision: Staff to assist with self feeding;Full supervision/cueing for compensatory strategies Compensations: Minimize environmental distractions;Slow rate;Small sips/bites;Follow solids with liquid Postural Changes: Seated upright at 90 degrees    Other  Recommendations Oral Care Recommendations: Oral care BID    Recommendations for follow up therapy are one component of a multi-disciplinary discharge planning process, led by the attending physician.  Recommendations may be updated based on patient status, additional functional criteria and insurance authorization.  Follow up Recommendations Follow physician's recommendations for discharge plan and follow up therapies      Assistance Recommended at Discharge Frequent or constant Supervision/Assistance  Functional Status Assessment Patient has had a recent decline in their functional status and demonstrates the ability to make significant improvements in function in a reasonable and predictable amount of time.  Frequency and Duration min 2x/week  2 weeks       Prognosis Prognosis for Safe Diet Advancement: Fair Barriers to Reach Goals: Time post onset;Cognitive deficits      Swallow Study   General Date of Onset: 04/12/22 HPI: Pt is a 71 y/o male admitted secondary to nausea/vomiting. Found to have hyperkalemia. MBS (03/24/22) revealed moderate phayrngeal dysphagia with aspiration before and during the swallow with thin and NTL. Weak cough response was inconsistent. Recommended dys 1, HTL at that time.PMH: ALS; stage 3 CKD;  glaucoma; HLD; h/o CVA with dysphagia; HTN; seizure d/o; and pacemaker placement. Type of Study: Bedside Swallow Evaluation Previous Swallow Assessment: see HPI Diet Prior to this Study: Dysphagia  1 (puree);Honey-thick liquids Temperature Spikes Noted: No Respiratory Status: Room air History of Recent Intubation: No Behavior/Cognition: Alert;Cooperative;Pleasant mood;Lethargic/Drowsy Oral Cavity Assessment: Within Functional Limits Oral Care Completed by SLP: Yes Oral Cavity - Dentition: Edentulous;Missing dentition Vision: Impaired for self-feeding Self-Feeding Abilities: Total assist Patient Positioning: Upright in bed;Postural control adequate for testing Baseline Vocal Quality: Low vocal intensity;Breathy Volitional Cough: Weak Volitional Swallow: Able to elicit    Oral/Motor/Sensory Function Overall Oral Motor/Sensory Function: Mild impairment Facial ROM: Within Functional Limits Facial Symmetry: Within Functional Limits Facial Strength: Within Functional Limits Lingual ROM: Within Functional Limits Lingual Symmetry: Within Functional Limits Lingual Strength: Other (Comment) (possible generalized weakness?)   Ice Chips Ice chips: Impaired Presentation: Spoon Oral Phase Impairments: Impaired mastication;Reduced lingual movement/coordination Oral Phase Functional Implications: Prolonged oral transit   Thin Liquid Thin Liquid: Impaired Presentation: Cup;Straw Pharyngeal  Phase Impairments: Suspected delayed Swallow    Nectar Thick Nectar Thick Liquid: Not tested   Honey Thick Honey Thick Liquid: Within functional limits Presentation: Straw   Puree Puree: Impaired Presentation: Spoon Oral Phase Impairments: Impaired mastication Oral Phase Functional Implications: Prolonged oral transit   Solid     Solid: Impaired Oral Phase Impairments: Impaired mastication Oral Phase Functional Implications: Prolonged oral transit;Impaired mastication       Ellwood Dense, [place], [place] Office Number: [phone]  Acie Fredrickson 04/14/2022,2:22 PM

## 2022-04-14 NOTE — Evaluation (Signed)
Speech Language Pathology Evaluation Patient Details Name: Darrell [name] MRN: [id number] DOB: 1951/01/08 Today's Date: 04/14/2022 Time: 6767-2094 SLP Time Calculation (min) (ACUTE ONLY): 13 min  Problem List:  Patient Active Problem List   Diagnosis Date Noted   DNR (do not resuscitate) 04/13/2022   Community acquired bacterial pneumonia 03/23/2022   Acute respiratory failure with hypoxia ([place]) 10/10/2021   Coffee ground emesis 10/07/2021   Hyperkalemia 10/07/2021   Normocytic anemia 10/07/2021   Bigeminal rhythm 10/07/2021   Fecal impaction ([place])    Acute metabolic encephalopathy 70/96/2836   COVID-19 virus infection 05/06/2021   Anemia, chronic disease 05/06/2021   Benign prostatic hyperplasia 05/06/2021   Ulcer of ankle ([place]) 03/29/2021   CKD (chronic kidney disease), stage IV ([place]) 03/09/2021   Cognitive communication deficit 01/10/2021   Chronic bronchitis ([place]) 05/25/2020   Chronic cough 05/25/2020   Goals of care, counseling/discussion    Palliative care by specialist    ARF (acute renal failure) ([facility]) 05/05/2019   Pain due to onychomycosis of toenails of both feet 03/25/2019   Primary lateral sclerosis ([place]) 08/28/2018   ALS (amyotrophic lateral sclerosis) ([place]) 07/24/2018   Dysarthria 05/21/2018   Dysphagia 05/21/2018   Gait abnormality 05/21/2018   Spasticity 05/21/2018   GERD (gastroesophageal reflux disease) 07/21/2017   Mixed hyperlipidemia 07/21/2017   Bradycardia    Seizure disorder ([place]) 07/08/2017   Status cardiac pacemaker 01/29/2017   Unintended weight loss 01/29/2017   Tinea pedis of right foot 01/29/2017   Hypertensive retinopathy of both eyes 01/16/2017   Hypercalcemia 01/15/2017   Heart block 01/15/2017   Nuclear sclerosis of both eyes 10/25/2016   Primary open angle glaucoma of both eyes, indeterminate stage 10/25/2016   Glaucoma 09/12/2016   Liver hemangioma 08/14/2016   Renal cyst 08/14/2016   Renal mass, right 08/14/2016   Elevated liver  enzymes 08/10/2016   Chronic kidney disease, stage 3b ([place]) 08/16/2015   At high risk for falls 08/16/2015   Subcutaneous nodules 08/16/2015   Prediabetes 10/07/2013   Inguinal hernia 03/25/2013   History of CVA with residual deficit 03/25/2013   Essential hypertension 10/09/2012   Tobacco abuse 10/09/2012   Past Medical History:  Past Medical History:  Diagnosis Date   Arthritis    Asthma    At high risk for falls 08/16/2015   CIDP (chronic inflammatory demyelinating polyneuropathy) ([name])    CKD (chronic kidney disease) stage 3, GFR 30-59 ml/min ([place]) 08/16/2015   Dysphagia as late effect of cerebrovascular disease    pts wife states pt has to eat soft foods    Elevated liver enzymes 08/10/2016   GERD (gastroesophageal reflux disease)    Glaucoma    High cholesterol    History of CVA with residual deficit 03/25/2013   Hypertension    Hypertensive retinopathy of both eyes 01/16/2017   Inguinal hernia 03/25/2013   Liver hemangioma 08/14/2016   New onset seizure ([place]) 07/08/2017   seizure 07/14/18   Nuclear sclerosis of both eyes 10/25/2016   Pneumonia    Presence of permanent cardiac pacemaker    Primary open angle glaucoma of both eyes, indeterminate stage 10/25/2016   Renal mass, right 08/14/2016   Status cardiac pacemaker 01/29/2017   Placed for second degree heart block on 01/16/17 Medtronic Azure XT DR MRI SureScan dual-chamber pacemaker   Stroke [facility])    2011 with residual deficit left sided weakness   Tobacco dependence    Past Surgical History:  Past Surgical History:  Procedure Laterality Date  EYE SURGERY     HERNIA REPAIR     INGUINAL HERNIA REPAIR Right 11/23/2014   Procedure: right inguinal hernia repair with mesh;  Surgeon: Armandina Gemma, MD;  Location: [facility];  Service: General;  Laterality: Right;   INSERTION OF MESH N/A 11/23/2014   Procedure: INSERTION OF MESH;  Surgeon: Armandina Gemma, MD;  Location: [facility];  Service: General;  Laterality: N/A;   JOINT  REPLACEMENT     MASS EXCISION Left 08/29/2017   Procedure: EXCISION OF LEFT NECK MASS;  Surgeon: Coralie Keens, MD;  Location: [facility];  Service: General;  Laterality: Left;   PACEMAKER IMPLANT N/A 01/16/2017   Procedure: Pacemaker Implant;  Surgeon: Will Meredith Leeds, MD;  Location: [facility];  Service: Cardiovascular;  Laterality: N/A;   SHOULDER SURGERY Bilateral 1988, 1998   HPI:  Pt is a 71 y/o male admitted secondary to nausea/vomiting. Found to have hyperkalemia. MBS (03/24/22) revealed moderate phayrngeal dysphagia with aspiration before and during the swallow with thin and NTL. Weak cough response was inconsistent. Recommended dys 1, HTL at that time.PMH: ALS; stage 3 CKD;  glaucoma; HLD; h/o CVA with dysphagia; HTN; seizure d/o; and pacemaker placement.   Assessment / Plan / Recommendation Clinical Impression  Pt appears to present at baseline function, per family, in regards to cognitive and speech/vocal function. He communicates in simple sentences with speech intelligibility reduced due to hypophonic vocal quality. With cues for increasing of vocal intensity, this improves from 50%- 75% intelligibility. Daughter reports that they often cue pt to speak louder and remove environmental noises in order to understand him better when he's communicating. Pt is oriented to self and place, he exhibited difficulty with concepts related to time. He reports that he uses the TV at home to orient himself to the current day. He follows simple 1 step commands with 75% accuracy and intermittently required verbal/tactile cues to assist with completion. Given reports of baseline function in regards to speech, cognition and communciation, no further SLP services warranted acutely at this time. Would benefit from SLP f/u at next venue of care.    SLP Assessment  SLP Recommendation/Assessment: All further Speech Lanaguage Pathology  needs can be addressed in the next venue of care SLP Visit  Diagnosis: Cognitive communication deficit (R41.841)    Recommendations for follow up therapy are one component of a multi-disciplinary discharge planning process, led by the attending physician.  Recommendations may be updated based on patient status, additional functional criteria and insurance authorization.    Follow Up Recommendations  Follow physician's recommendations for discharge plan and follow up therapies    Assistance Recommended at Discharge  Frequent or constant Supervision/Assistance  Functional Status Assessment Patient has not had a recent decline in their functional status  Frequency and Duration min 2x/week         SLP Evaluation Cognition  Overall Cognitive Status: History of cognitive impairments - at baseline Arousal/Alertness: Awake/alert Orientation Level: Oriented to person;Disoriented to time;Oriented to place Year:  (1972) Month: September Day of Week: Incorrect Attention: Sustained Sustained Attention: Appears intact Awareness: Impaired Awareness Impairment: Intellectual impairment       Comprehension  Auditory Comprehension Overall Auditory Comprehension: Impaired Commands: Impaired One Step Basic Commands: 50-74% accurate Visual Recognition/Discrimination Discrimination: Not tested Reading Comprehension Reading Status: Not tested    Expression Verbal Expression Overall Verbal Expression: Appears within functional limits for tasks assessed Written Expression Written Expression: Not tested   Oral / Motor  Oral Motor/Sensory Function Overall Oral  Motor/Sensory Function: Mild impairment Facial ROM: Within Functional Limits Facial Symmetry: Within Functional Limits Facial Strength: Within Functional Limits Lingual ROM: Within Functional Limits Lingual Symmetry: Within Functional Limits Lingual Strength:  (possible generalized weakness?) Motor Speech Overall Motor Speech: Impaired at baseline Respiration: Within functional  limits Phonation: Breathy;Low vocal intensity Intelligibility: Intelligibility reduced Word: 75-100% accurate Phrase: 50-74% accurate Sentence: 25-49% accurate Conversation: 25-49% accurate Effective Techniques: Increased vocal intensity             Ellwood Dense, MA, [place] Office Number: [phone]  Acie Fredrickson 04/14/2022, 2:38 PM

## 2022-04-14 NOTE — Progress Notes (Signed)
  Progress Note   Patient: Darrell [name] VQQ:[id number] DOB: August 08, 1951 DOA: 04/12/2022     0 DOS: the patient was seen and examined on 04/14/2022   Brief hospital course: 71 year old man PMH ALS CKD stage III presenting with nausea, vomiting and diarrhea.  Admitted for same, hyperkalemia.  Assessment and Plan: Nausea, vomiting, diarrhea -- Appears to be spontaneously resolved.  Favor viral process.  Monitor toleration of diet.  If continues to improve, can likely discharge tomorrow.  CKD stage IV with associated hyperkalemia --Creatinine appears to be at baseline, no evidence of AKI --Additional Lokelma today.  IV fluids.  No obvious offending medications. --Continue current treatment, check BMP in AM.   ALS -- Appears stable.  Anemia of CKD --stable   H/o CVA with dysphagia --Current diet is pureed with nectar-thick liquids --Continue Robinul   HTN --Stable.  Continue amlodipine, hydralazine Toprol XL   HLD -Continue Lipitor   Glaucoma -Continue brimonidine, Cosopt, latanoprost   Seizure d/o -Continue Keppra   COPD -Continue home meds - Albuterol, Breo, Incruse Ellipta -He is also on 3x/week Azithromycin    BPH -Continue Proscar, tamsulosin   Tobacco dependence -Encourage cessation -Patch ordered       Subjective:  Feels better No vomiting now Tolerated breakfast Has chronic loose stools, at baseline  Physical Exam: Vitals:   04/14/22 0521 04/14/22 0751 04/14/22 0930 04/14/22 1636  BP: (!) 155/83  (!) 143/75 (!) 144/75  Pulse: 60  67 62  Resp: '13  17 18  '$ Temp: 97.9 F (36.6 C)  98.3 F (36.8 C) 98.1 F (36.7 C)  TempSrc: Oral     SpO2: 98% 96% 97% 99%   Physical Exam Vitals reviewed.  Constitutional:      General: He is not in acute distress.    Appearance: He is not ill-appearing or toxic-appearing.  Cardiovascular:     Rate and Rhythm: Normal rate and regular rhythm.     Heart sounds: No murmur heard.    Comments: Telemetry paced  rhythm Pulmonary:     Effort: Pulmonary effort is normal. No respiratory distress.     Breath sounds: No wheezing, rhonchi or rales.  Neurological:     Mental Status: He is alert.  Psychiatric:        Mood and Affect: Mood normal.        Behavior: Behavior normal.      Data Reviewed:  K+ 5.3 Creatinine stable 3.06 Hgb stable 9.1  Family Communication: wife at bedside  Disposition: Status is: Inpatient Remains inpatient appropriate because: hyperkalemia  Planned Discharge Destination: Home    Time spent: 25 minutes  Author: Murray Hodgkins, MD 04/14/2022 7:42 PM  For on call review [email or web address].

## 2022-04-14 NOTE — Plan of Care (Signed)
  Problem: Nutrition: Goal: Adequate nutrition will be maintained Outcome: Progressing   Problem: Skin Integrity: Goal: Risk for impaired skin integrity will decrease Outcome: Progressing   

## 2022-04-14 NOTE — Progress Notes (Signed)
New Admission Note:   Arrival Method: Stretcher Mental Orientation: alert  Telemetry: box 9 Assessment: Completed Skin: see flowsheet IV:NSL Pain: none Tubes: none Safety Measures: Safety Fall Prevention Plan has been discussed Admission: Completed [facility] Orientation: Patient has been orientated to the room, unit and staff.  Family: wife at bedside  Orders have been reviewed and implemented. Will continue to monitor the patient. Call light has been placed within reach and bed alarm has been activated.   Rockie Neighbours BSN, RN Phone number: [phone]

## 2022-04-14 NOTE — Telephone Encounter (Signed)
Pt is currently in ED.

## 2022-04-15 DIAGNOSIS — R338 Other retention of urine: Secondary | ICD-10-CM

## 2022-04-15 LAB — BASIC METABOLIC PANEL
Anion gap: 7 (ref 5–15)
BUN: 39 mg/dL — ABNORMAL HIGH (ref 8–23)
CO2: 19 mmol/L — ABNORMAL LOW (ref 22–32)
Calcium: 9.1 mg/dL (ref 8.9–10.3)
Chloride: 111 mmol/L (ref 98–111)
Creatinine, Ser: 2.72 mg/dL — ABNORMAL HIGH (ref 0.61–1.24)
GFR, Estimated: 24 mL/min — ABNORMAL LOW (ref >60)
Glucose, Bld: 109 mg/dL — ABNORMAL HIGH (ref 70–99)
Potassium: 4.7 mmol/L (ref 3.5–5.1)
Sodium: 137 mmol/L (ref 135–145)

## 2022-04-15 MED ORDER — TORSEMIDE 20 MG PO TABS
20.0000 mg | ORAL_TABLET | Freq: Every day | ORAL | Status: DC
  Administered 2022-04-15 – 2022-04-16 (×2): 20 mg via ORAL
  Filled 2022-04-15 (×2): qty 1

## 2022-04-15 NOTE — Progress Notes (Signed)
  Progress Note   Patient: Darrell [name] POE:[id number] DOB: 1951/08/08 DOA: 04/12/2022     1 DOS: the patient was seen and examined on 04/15/2022   Brief hospital course: 71 year old man PMH ALS CKD stage III presenting with nausea, vomiting and diarrhea.  Admitted for same, hyperkalemia.  Assessment and Plan: Nausea, vomiting, diarrhea -- Spontaneously resolved.  Favor viral process.  Tolerating diet.  If continues to improve, can likely discharge tomorrow.   CKD stage IV with associated hyperkalemia --Creatinine appears to be at baseline, no evidence of AKI --potassium WNL s/p Lokelma. StopIV fluids.  No obvious offending medications.  Acute urinary retention, BPH --new dx. continue Flomax, Proscar, resume torsemide. In/out as needed today --if persists, place foley 7/23 and discharge home   ALS -- Appears stable.   Anemia of CKD --stable   H/o CVA with dysphagia --Current diet is pureed with nectar-thick liquids --Continue Robinul   HTN --Stable.  Continue amlodipine, hydralazine Toprol XL   HLD -Continue Lipitor   Glaucoma -Continue brimonidine, Cosopt, latanoprost   Seizure d/o -Continue Keppra   COPD -Continue home meds - Albuterol, Breo, Incruse Ellipta -He is also on 3x/week Azithromycin    Tobacco dependence -Encourage cessation -Patch ordered    Subjective:  Feels fine Eating fine No vomiting or diarrhea Had to have in/out cath for urinary retention last night, a new problem, does not have a urologist Hasn't voided today  Physical Exam: Vitals:   04/14/22 1636 04/14/22 2031 04/15/22 0431 04/15/22 1001  BP: (!) 144/75 (!) 148/72 (!) 143/72 138/72  Pulse: 62 (!) 59 (!) 57 67  Resp: '18 19 19 18  '$ Temp: 98.1 F (36.7 C) 97.8 F (36.6 C) 98.8 F (37.1 C) 97.9 F (36.6 C)  TempSrc:  Oral Oral   SpO2: 99% 96% 96% 93%   Physical Exam Vitals reviewed.  Constitutional:      General: He is not in acute distress.    Appearance: He is not  ill-appearing or toxic-appearing.  Cardiovascular:     Rate and Rhythm: Normal rate and regular rhythm.     Heart sounds: No murmur heard. Pulmonary:     Effort: Pulmonary effort is normal. No respiratory distress.     Breath sounds: No wheezing, rhonchi or rales.  Neurological:     Mental Status: He is alert.  Psychiatric:        Mood and Affect: Mood normal.        Behavior: Behavior normal.     Data Reviewed:  UOP 1110 K+ down to 4.7 BUN down to 39 Creatnine down to 2.72  Family Communication: wife at bedside  Disposition: Status is: Inpatient Remains inpatient appropriate because: acute urinary retention  Planned Discharge Destination: Home    Time spent: 20 minutes  Author: Murray Hodgkins, MD 04/15/2022 11:04 AM  For on call review [email or web address].

## 2022-04-16 DIAGNOSIS — E875 Hyperkalemia: Secondary | ICD-10-CM | POA: Diagnosis not present

## 2022-04-16 DIAGNOSIS — G1221 Amyotrophic lateral sclerosis: Secondary | ICD-10-CM | POA: Diagnosis not present

## 2022-04-16 DIAGNOSIS — R4182 Altered mental status, unspecified: Secondary | ICD-10-CM | POA: Diagnosis not present

## 2022-04-16 DIAGNOSIS — Z7401 Bed confinement status: Secondary | ICD-10-CM | POA: Diagnosis not present

## 2022-04-16 DIAGNOSIS — N184 Chronic kidney disease, stage 4 (severe): Secondary | ICD-10-CM | POA: Diagnosis not present

## 2022-04-16 DIAGNOSIS — R112 Nausea with vomiting, unspecified: Secondary | ICD-10-CM | POA: Diagnosis not present

## 2022-04-16 DIAGNOSIS — Z743 Need for continuous supervision: Secondary | ICD-10-CM | POA: Diagnosis not present

## 2022-04-16 MED ORDER — CHLORHEXIDINE GLUCONATE CLOTH 2 % EX PADS: 6.0000 | MEDICATED_PAD | Freq: Every day | CUTANEOUS | Status: DC

## 2022-04-16 NOTE — TOC Transition Note (Signed)
Transition of Care [facility]) - CM/SW Discharge Note   Patient Details  Name: GUNNER IODICE MRN: [id number] Date of Birth: [birth year]  Transition of Care [facility]) CM/SW Contact:  Bartholomew Crews, RN Phone Number: [phone] 04/16/2022, 1:58 PM   Clinical Narrative:     Spoke with spouse, [name], to discuss post acute transition. Discussed MD recommendations for [facility] RN. Offered agency choice. Spouse stated that he had recently had therapy with an agency, but she had no preference for agency at this time. Advised that [facility] was available to start care on Tuesday, and spouse agreed. Patient will need nonemergency transportation - PTAR arranged. Medical transport paperwork completed and sent to printer at nurses station. No further TOC needs identified at this time.   Final next level of care: [place] Barriers to Discharge: No Barriers Identified   Patient Goals and CMS Choice Patient states their goals for this hospitalization and ongoing recovery are:: home with spouse CMS Medicare.gov Compare Post Acute Care list provided to:: Patient Represenative (must comment) (spouse - [name]) Choice offered to / list presented to : Spouse  Discharge Placement                       Discharge Plan and Services                DME Arranged: N/A DME Agency: NA       HH Arranged: RN [place] Agency: [place] Date [facility] Contacted: 04/16/22 Time [place]: 1601 Representative spoke with at [place]: [place] ([place]) Interventions     Readmission Risk Interventions     No data to display

## 2022-04-16 NOTE — Progress Notes (Signed)
DISCHARGE NOTE HOME Darrell [name] to be discharged Home per MD order. Discussed prescriptions and follow up appointments with the patient. Prescriptions given to patient; medication list explained in detail. Patient verbalized understanding.  Skin clean, dry and intact without evidence of skin break down, no evidence of skin tears noted. IV catheter discontinued intact. Site without signs and symptoms of complications. Dressing and pressure applied. Pt denies pain at the site currently. No complaints noted. Discharging with foley to FU with urologist.  Patient free of additional  lines, drains, and wounds.   An After Visit Summary (AVS) was printed and given to the patient. Patient escorted PTAR Berneta Levins, RN

## 2022-04-16 NOTE — Discharge Summary (Signed)
Physician Discharge Summary   Patient: Darrell [name] MRN: [id number] DOB: Feb 28, 1951  Admit date:     04/12/2022  Discharge date: 04/16/22  Discharge Physician: Murray Hodgkins   PCP: [name], MD   Recommendations at discharge:   Ongoing care for CKD stage IV Home health RN for new Foley catheter secondary to urinary retention, suggest outpatient follow-up with urology  Discharge Diagnoses: Principal Problem:   Hyperkalemia Active Problems:   Essential hypertension   Tobacco abuse   History of CVA with residual deficit   Seizure disorder ([place])   Primary open angle glaucoma of both eyes, indeterminate stage   Mixed hyperlipidemia   ALS (amyotrophic lateral sclerosis) ([name])   CKD (chronic kidney disease), stage IV ([place])   DNR (do not resuscitate)  Resolved Problems:   * No resolved hospital problems. *  Hospital Course: 71 year old man PMH ALS CKD stage III presenting with nausea, vomiting and diarrhea.  Admitted for same, hyperkalemia.  Nausea, vomiting and diarrhea spontaneously resolved.  Hyperkalemia resolved with Lokelma.  Renal function stable.  Developed urinary retention.  Despite multiple in and out caths, persisted, therefore Foley catheter placed.  Follow-up as an outpatient.  Nausea, vomiting, diarrhea -- Spontaneously resolved.  Favor viral process.  Tolerating diet.     CKD stage IV with associated hyperkalemia --Creatinine appears to be at baseline, no evidence of AKI --potassium WNL s/p Lokelma.  No obvious offending medications.   Acute urinary retention, BPH --new dx. Continue Flomax, Proscar  -- Persisted despite multiple in and out caths.  Foley catheter placed.  Outpatient follow-up.     ALS -- Appears stable.   Anemia of CKD --stable   H/o CVA with dysphagia --Current diet is pureed with nectar-thick liquids --Continue Robinul   HTN --Stable.  Continue amlodipine, hydralazine Toprol XL   HLD -Continue Lipitor    Glaucoma -Continue brimonidine, Cosopt, latanoprost   Seizure d/o -Continue Keppra   COPD -Continue home meds - Albuterol, Breo, Incruse Ellipta -He is also on 3x/week Azithromycin    Tobacco dependence -Encourage cessation -Patch ordered       Consultants:  None  Procedures performed:  None   Disposition: Home health RN Dysphagia 1 honey thick  DISCHARGE MEDICATION: Allergies as of 04/16/2022       Reactions   Ace Inhibitors Other (See Comments)   Hyperkalemia   Doxycycline Other (See Comments)   Hiccups, cough, nausea and emesis, elevated liver enzymes, elevated eosinophils, SOB concerning for early DRESS syndrome    Atacand Hct [candesartan Cilexetil-hctz] Hives   Shellfish Allergy Hives        Medication List     STOP taking these medications    Breztri Aerosphere 160-9-4.8 MCG/ACT Aero Generic drug: Budeson-Glycopyrrol-Formoterol       TAKE these medications    acetaminophen 325 MG tablet Commonly known as: TYLENOL Take 325 mg by mouth daily as needed (pain).   albuterol 108 (90 Base) MCG/ACT inhaler Commonly known as: VENTOLIN HFA Inhale 2 puffs into the lungs every 6 (six) hours as needed for up to 30 days for Wheezing. What changed:  how much to take when to take this reasons to take this   amLODipine 10 MG tablet Commonly known as: NORVASC Take 1 tablet by mouth once daily   atorvastatin 40 MG tablet Commonly known as: LIPITOR Take 1 tablet (40 mg total) by mouth daily.   azithromycin 500 MG tablet Commonly known as: ZITHROMAX Take 1 tablet (500 mg total) by mouth See  admin instructions. Take 1 tablet by mouth Monday,Wednesday and Friday   Breo Ellipta 200-25 MCG/ACT Aepb Generic drug: fluticasone furoate-vilanterol Inhale 2 puffs into the lungs daily.   brimonidine 0.2 % ophthalmic solution Commonly known as: ALPHAGAN Place 1 drop into both eyes daily.   cetirizine 10 MG tablet Commonly known as: ZYRTEC Take 1 tablet  (10 mg total) by mouth daily.   dicyclomine 10 MG capsule Commonly known as: BENTYL Take 10 mg by mouth daily as needed (pain).   dorzolamide-timolol 22.3-6.8 MG/ML ophthalmic solution Commonly known as: COSOPT Place 1 drop into both eyes daily in the afternoon.   ferrous sulfate 325 (65 FE) MG tablet Commonly known as: FeroSul Take 1 tablet (325 mg total) by mouth 2 (two) times daily with a meal.   finasteride 5 MG tablet Commonly known as: PROSCAR Take 5 mg by mouth daily.   glycopyrrolate 1 MG tablet Commonly known as: ROBINUL Take 1 tablet (1 mg total) by mouth 2 times daily. What changed:  how much to take how to take this when to take this   hydrALAZINE 10 MG tablet Commonly known as: APRESOLINE Take 1 tablet (10 mg total) by mouth 3 (three) times daily. What changed: when to take this   Incruse Ellipta 62.5 MCG/ACT Aepb Generic drug: umeclidinium bromide Inhale 2 puffs into the lungs daily.   latanoprost 0.005 % ophthalmic solution Commonly known as: XALATAN Place 1 drop into both eyes at bedtime.   levETIRAcetam 500 MG tablet Commonly known as: KEPPRA Take 1 tablet (500 mg total) by mouth 2 (two) times daily.   metoprolol succinate 50 MG 24 hr tablet Commonly known as: TOPROL-XL Take 1 tablet (50 mg total) by mouth at bedtime. Take with or immediately following a meal.   Misc. Devices Misc Yankauer suction. Dx: ALS   Misc. Devices Misc Coughalator   Misc. Devices Misc Nebulizer machine.  Diagnosis- Motor neuron disease   Misc. [facility] Bed DXg12.20   [place]. [place] [facility] bed extension. Diagnosis: ALS. Height 6'4"   Misc. Devices Misc Manual wheelchair.  Diagnosis-ALS   Misc. Devices Misc Right and left wrist splint   montelukast 10 MG tablet Commonly known as: SINGULAIR Take 10 mg by mouth at bedtime.   Multi-Vitamin tablet Take 1 tablet by mouth daily.   polyethylene glycol 17 g packet Commonly known  as: MiraLax Take 17 g by mouth daily.   tamsulosin 0.4 MG Caps capsule Commonly known as: FLOMAX Take 1 capsule (0.4 mg total) by mouth daily.   torsemide 20 MG tablet Commonly known as: DEMADEX Take 20 mg by mouth daily as needed (fluid).        Follow-up Information     [facility]. Schedule an appointment as soon as possible for a visit in 2 week(s).   Why: For urinary catheter followup Contact information: [place] [phone]               Feels good Foley placed yesterday  Discharge Exam: There were no vitals filed for this visit. Physical Exam Vitals reviewed.  Constitutional:      General: He is not in acute distress.    Appearance: He is not ill-appearing or toxic-appearing.  Cardiovascular:     Rate and Rhythm: Normal rate and regular rhythm.     Heart sounds: No murmur heard. Pulmonary:     Effort: Pulmonary effort is normal. No respiratory distress.  Breath sounds: No wheezing, rhonchi or rales.  Neurological:     Mental Status: He is alert.  Psychiatric:        Mood and Affect: Mood normal.        Behavior: Behavior normal.    No new labs  Condition at discharge: good  The results of significant diagnostics from this hospitalization (including imaging, microbiology, ancillary and laboratory) are listed below for reference.   Imaging Studies: DG Abdomen Acute W/Chest  Result Date: 04/12/2022 CLINICAL DATA:  Vomiting, nausea EXAM: DG ABDOMEN ACUTE WITH 1 VIEW CHEST COMPARISON:  03/23/2022 FINDINGS: Supine and upright frontal views of the abdomen and pelvis as well as an upright frontal view of the chest are obtained. Dual lead pacer again noted. Cardiac silhouette is stable. Persistent left basilar consolidation and likely small left effusion. Right chest is clear. No pneumothorax. Bowel gas pattern is unremarkable without obstruction or ileus. Retained oral contrast throughout  the colon. Multiple rounded densities projecting over the left upper quadrant likely reflect ingested pills. No free gas in the greater peritoneal sac. No abdominal masses or abnormal calcifications. IMPRESSION: 1. Unremarkable bowel gas pattern. 2. Stable left basilar consolidation and likely small left effusion. Electronically Signed   By: Randa Ngo M.D.   On: 04/12/2022 21:18   DG Swallowing Func-Speech Pathology  Result Date: 03/24/2022 Table formatting from the original result was not included. Objective Swallowing Evaluation: Type of Study: MBS-Modified Barium Swallow Study  Patient Details Name: SILVINO SELMAN MRN: [id number] Date of Birth: [birth year] Today's Date: 03/24/2022 Time: SLP Start Time (ACUTE ONLY): 8588 -SLP Stop Time (ACUTE ONLY): 5027 SLP Time Calculation (min) (ACUTE ONLY): 15 min Past Medical History: Past Medical History: Diagnosis Date  Arthritis   At high risk for falls 08/16/2015  Bradycardia   Chronic kidney disease   stage 3 GFR 30-59 ml/min   CIDP (chronic inflammatory demyelinating polyneuropathy) ([name])   CKD (chronic kidney disease) stage 3, GFR 30-59 ml/min ([place]) 08/16/2015  Dysphagia as late effect of cerebrovascular disease   pts wife states pt has to eat soft foods   Elevated liver enzymes 08/10/2016  GERD (gastroesophageal reflux disease)   Glaucoma   High cholesterol   History of CVA with residual deficit 03/25/2013  Hypertension   Hypertensive retinopathy of both eyes 01/16/2017  Inguinal hernia 03/25/2013  Liver hemangioma 08/14/2016  Neuromuscular disorder ([place])   chronic inflammatory demyelinating polyneuropathy   New onset seizure ([place]) 07/08/2017  seizure 07/14/18  Nuclear sclerosis of both eyes 10/25/2016  Presence of permanent cardiac pacemaker   placed in april 2018  Primary open angle glaucoma of both eyes, indeterminate stage 10/25/2016  Renal mass, right 08/14/2016  Status cardiac pacemaker 01/29/2017  Placed for second degree heart block on 01/16/17 Medtronic Azure XT DR  MRI SureScan dual-chamber pacemaker  Stroke [facility])   2011 with residual deficit left sided weakness  Tobacco dependence  Past Surgical History: Past Surgical History: Procedure Laterality Date  EYE SURGERY    HERNIA REPAIR    INGUINAL HERNIA REPAIR Right 11/23/2014  Procedure: right inguinal hernia repair with mesh;  Surgeon: Armandina Gemma, MD;  Location: [facility];  Service: General;  Laterality: Right;  INSERTION OF MESH N/A 11/23/2014  Procedure: INSERTION OF MESH;  Surgeon: Armandina Gemma, MD;  Location: [facility];  Service: General;  Laterality: N/A;  MASS EXCISION Left 08/29/2017  Procedure: EXCISION OF LEFT NECK MASS;  Surgeon: Coralie Keens, MD;  Location: [facility];  Service: General;  Laterality: Left;  PACEMAKER IMPLANT N/A 01/16/2017  Procedure: Pacemaker Implant;  Surgeon: Will Meredith Leeds, MD;  Location: [facility];  Service: Cardiovascular;  Laterality: N/A;  SHOULDER SURGERY Bilateral 1988, 1998 HPI: RENZO VINCELETTE is a 71 y.o. male with medical history significant of advanced ALS currently bedbound with history of seizure, CVA, pacemaker placement, hypertension, hyperlipidemia, CKD 3a and tobacco abuse.  Patient presents with  altered mental status and some coughing more than usual. Chest x-ray concerning for possible left-sided pneumonia. Family indicates he is typically on a mostly chopped. MBS 09/2021 delay to valleculae, frank silent aspiration with nectar. Intermittent backflow into pharynx from esohpagus. Recommend honey thick and puree. MBS in 08/20 recommending dysphagia 1 solids and thin liquids with meds whole in puree.                .  No data recorded  Recommendations for follow up therapy are one component of a multi-disciplinary discharge planning process, led by the attending physician.  Recommendations may be updated based on patient status, additional functional criteria and insurance authorization. Assessment / Plan / Recommendation   03/24/2022   3:53 PM Clinical Impressions Clinical  Impression Pt demonstrated moderate pharyngeal dysphagia with aspiration before and during the swallow. Timing of laryngeal protection was late with thin and nectar aspirating before swallow was initiated. Response was inconsistent with weak cough and throat clear which did not clear aspirates. Presently he is not capable of performing consistent compensatory strategy. Decreased pharyngeal wall contraction led to pyriform sinus residue, mild. Honey thick moved slower through pharynx allowing more time for laryngeal closure that spilled over epiglottis but was not penetrated using straw for several trials. There was one instance of penetration before the swallow to the cords that was ejected during second swallow with honey thick barium. Pt typically eats without dentures (not present) however eats softer foods and did not think he could masticate the graham cracker. Recommend puree (Dys 1), honey thick liquids, pills crushed, upright posture. ST to follow. SLP Visit Diagnosis Dysphagia, pharyngeal phase (R13.13) Impact on safety and function Moderate aspiration risk;Severe aspiration risk     03/24/2022   3:53 PM Treatment Recommendations Treatment Recommendations Therapy as outlined in treatment plan below     03/24/2022   3:53 PM Prognosis Prognosis for Safe Diet Advancement Fair Barriers to Reach Goals Cognitive deficits   03/24/2022   3:53 PM Diet Recommendations SLP Diet Recommendations Dysphagia 1 (Puree) solids;Honey thick liquids Liquid Administration via Straw;Cup Medication Administration Crushed with puree Compensations Slow rate;Small sips/bites;Minimize environmental distractions;Clear throat intermittently Postural Changes Seated upright at 90 degrees     03/24/2022   3:53 PM Other Recommendations Oral Care Recommendations Oral care BID Follow Up Recommendations Home health SLP Assistance recommended at discharge Frequent or constant Supervision/Assistance Functional Status Assessment Patient has had a  recent decline in their functional status and demonstrates the ability to make significant improvements in function in a reasonable and predictable amount of time.   03/24/2022   3:53 PM Frequency and Duration  Speech Therapy Frequency (ACUTE ONLY) min 2x/week Treatment Duration 2 weeks     03/24/2022   3:53 PM Oral Phase Oral Phase [facility]    03/24/2022   3:53 PM Pharyngeal Phase Pharyngeal Phase Impaired Pharyngeal- Honey Cup Pharyngeal residue - valleculae;Delayed swallow initiation-vallecula Pharyngeal- Nectar Cup Penetration/Aspiration before swallow Pharyngeal Material enters airway, passes BELOW cords and not ejected out despite cough attempt by patient Pharyngeal- Nectar Straw Pharyngeal residue - valleculae;Pharyngeal residue - pyriform;Penetration/Aspiration during  swallow Pharyngeal Material enters airway, passes BELOW cords and not ejected out despite cough attempt by patient;Material enters airway, CONTACTS cords and not ejected out Pharyngeal- Thin Straw Delayed swallow initiation-pyriform sinuses;Penetration/Aspiration during swallow;Penetration/Aspiration before swallow Pharyngeal Material enters airway, remains ABOVE vocal cords and not ejected out;Material enters airway, passes BELOW cords without attempt by patient to eject out (silent aspiration) Pharyngeal- Puree [facility]    03/24/2022   3:53 PM Cervical Esophageal Phase  Cervical Esophageal Phase Darryll Capers [place] Siren 03/24/2022, 4:21 PM                     DG Chest 2 View  Result Date: 03/23/2022 CLINICAL DATA:  Shortness of breath. History of ALS. Congestion. Fever. EXAM: CHEST - 2 VIEW COMPARISON:  10/08/2021 FINDINGS: Pacemaker as seen previously. Heart size is normal. Chronic aortic atherosclerosis. Left lower lobe infiltrate/volume loss consistent with bronchopneumonia. The remainder the chest is clear. IMPRESSION: Left lower lobe infiltrate/volume loss consistent with bronchopneumonia. Electronically Signed   By: Nelson Chimes M.D.   On:  03/23/2022 13:31   CUP PACEART REMOTE DEVICE CHECK  Result Date: 03/21/2022 Scheduled remote reviewed. Normal device function.  2 NSVT episodes logged, actually just 1 episode occurring 01/01/22 @ 2322 lasting 24 beats. Routing for further review per protocol. Next remote 54 days- JJB   Microbiology: Results for orders placed or performed during the hospital encounter of 03/23/22  SARS Coronavirus 2 by RT PCR (hospital order, performed in [facility] hospital lab) *cepheid single result test* Anterior Nasal Swab     Status: None   Collection Time: 03/23/22 12:55 PM   Specimen: Anterior Nasal Swab  Result Value Ref Range Status   SARS Coronavirus 2 by RT PCR NEGATIVE NEGATIVE Final    Comment: (NOTE) SARS-CoV-2 target nucleic acids are NOT DETECTED.  The SARS-CoV-2 RNA is generally detectable in upper and lower respiratory specimens during the acute phase of infection. The lowest concentration of SARS-CoV-2 viral copies this assay can detect is 250 copies / mL. A negative result does not preclude SARS-CoV-2 infection and should not be used as the sole basis for treatment or other patient management decisions.  A negative result may occur with improper specimen collection / handling, submission of specimen other than nasopharyngeal swab, presence of viral mutation(s) within the areas targeted by this assay, and inadequate number of viral copies (<250 copies / mL). A negative result must be combined with clinical observations, patient history, and epidemiological information.  Fact Sheet for Patients:   https://www.patel.info/  Fact Sheet for Healthcare Providers: https://hall.com/  This test is not yet approved or  cleared by the [place] FDA and has been authorized for detection and/or diagnosis of SARS-CoV-2 by FDA under an Emergency Use Authorization (EUA).  This EUA will remain in effect (meaning this test can be used) for the  duration of the COVID-19 declaration under Section 564(b)(1) of the Act, 21 U.S.C. section 360bbb-3(b)(1), unless the authorization is terminated or revoked sooner.  Performed at [facility], [place] [address] [place], [place] [address]   Blood culture (routine x 2)     Status: None   Collection Time: 03/23/22  1:55 PM   Specimen: BLOOD  Result Value Ref Range Status   Specimen Description BLOOD RIGHT ANTECUBITAL  Final   Special Requests   Final    BOTTLES DRAWN AEROBIC AND ANAEROBIC Blood Culture results may not be optimal due to an inadequate volume of blood received in culture bottles  Culture   Final    NO GROWTH 5 DAYS Performed at [facility], [place] [address], [place], [place] [address]    Report Status 03/28/2022 FINAL  Final  Blood culture (routine x 2)     Status: None   Collection Time: 03/23/22  1:59 PM   Specimen: BLOOD  Result Value Ref Range Status   Specimen Description BLOOD BLOOD RIGHT FOREARM  Final   Special Requests   Final    BOTTLES DRAWN AEROBIC AND ANAEROBIC Blood Culture results may not be optimal due to an inadequate volume of blood received in culture bottles   Culture   Final    NO GROWTH 5 DAYS Performed at [facility], [place] [address], [place], [place] [address]    Report Status 03/28/2022 FINAL  Final    Labs: CBC: Recent Labs  Lab 04/12/22 2007 04/14/22 0356  WBC 15.9* 11.7*  HGB 11.4* 9.1*  HCT 35.2* 27.9*  MCV 87.6 88.6  PLT 266 035   Basic Metabolic Panel: Recent Labs  Lab 04/13/22 0129 04/13/22 1333 04/13/22 2210 04/14/22 0356 04/15/22 0923  NA 141 141 140 140 137  K 5.7* 5.7* 5.3* 5.3* 4.7  CL 114* 116* 114* 113* 111  CO2 21* 22 20* 20* 19*  GLUCOSE 151* 112* 129* 103* 109*  BUN 45* 48* 47* 46* 39*  CREATININE 2.96* 3.21* 3.20* 3.06* 2.72*  CALCIUM 9.2 9.2 9.3 9.2 9.1   Liver Function Tests: Recent Labs  Lab 04/12/22 2007  AST 12*  ALT 11  ALKPHOS 100  BILITOT 0.7  PROT 6.5  ALBUMIN 3.4*    CBG: Recent Labs  Lab 04/13/22 0652  GLUCAP 151*    Discharge time spent: less than 30 minutes.  Signed: Murray Hodgkins, MD [facility] 04/16/2022

## 2022-04-18 DIAGNOSIS — Z9181 History of falling: Secondary | ICD-10-CM | POA: Diagnosis not present

## 2022-04-18 DIAGNOSIS — I129 Hypertensive chronic kidney disease with stage 1 through stage 4 chronic kidney disease, or unspecified chronic kidney disease: Secondary | ICD-10-CM | POA: Diagnosis not present

## 2022-04-18 DIAGNOSIS — D631 Anemia in chronic kidney disease: Secondary | ICD-10-CM | POA: Diagnosis not present

## 2022-04-18 DIAGNOSIS — Z466 Encounter for fitting and adjustment of urinary device: Secondary | ICD-10-CM | POA: Diagnosis not present

## 2022-04-18 DIAGNOSIS — I498 Other specified cardiac arrhythmias: Secondary | ICD-10-CM | POA: Diagnosis not present

## 2022-04-18 DIAGNOSIS — I69391 Dysphagia following cerebral infarction: Secondary | ICD-10-CM | POA: Diagnosis not present

## 2022-04-18 DIAGNOSIS — R338 Other retention of urine: Secondary | ICD-10-CM | POA: Diagnosis not present

## 2022-04-18 DIAGNOSIS — G9341 Metabolic encephalopathy: Secondary | ICD-10-CM | POA: Diagnosis not present

## 2022-04-18 DIAGNOSIS — J440 Chronic obstructive pulmonary disease with acute lower respiratory infection: Secondary | ICD-10-CM | POA: Diagnosis not present

## 2022-04-18 DIAGNOSIS — J159 Unspecified bacterial pneumonia: Secondary | ICD-10-CM | POA: Diagnosis not present

## 2022-04-18 DIAGNOSIS — U071 COVID-19: Secondary | ICD-10-CM | POA: Diagnosis not present

## 2022-04-18 DIAGNOSIS — G1221 Amyotrophic lateral sclerosis: Secondary | ICD-10-CM | POA: Diagnosis not present

## 2022-04-18 DIAGNOSIS — F17200 Nicotine dependence, unspecified, uncomplicated: Secondary | ICD-10-CM | POA: Diagnosis not present

## 2022-04-18 DIAGNOSIS — N184 Chronic kidney disease, stage 4 (severe): Secondary | ICD-10-CM | POA: Diagnosis not present

## 2022-04-18 DIAGNOSIS — G40909 Epilepsy, unspecified, not intractable, without status epilepticus: Secondary | ICD-10-CM | POA: Diagnosis not present

## 2022-04-18 DIAGNOSIS — N401 Enlarged prostate with lower urinary tract symptoms: Secondary | ICD-10-CM | POA: Diagnosis not present

## 2022-04-19 ENCOUNTER — Other Ambulatory Visit: Payer: Self-pay | Admitting: *Deleted

## 2022-04-19 ENCOUNTER — Telehealth: Payer: Self-pay | Admitting: Family Medicine

## 2022-04-19 DIAGNOSIS — I10 Essential (primary) hypertension: Secondary | ICD-10-CM

## 2022-04-19 NOTE — Patient Outreach (Addendum)
  Care Coordination [facility] Note Transition Care Management Follow-up Telephone Call Date of discharge and from where: 04/16/22 [facility] How have you been since you were released from the hospital? Wife states she feels that the patient is doing fairly well. Any questions or concerns? Yes,  wife states the patient has a pressure wound on his bottom area and would like dressings to apply. She explains the patient needs Thick-It for thin liquids due to swallowing difficulties. Nurse advised wife she can order Thick-it on-line and explained that the nurse from [facility] would be able to assist with wound on the bottom area  Items Reviewed: Did the pt receive and understand the discharge instructions provided? Yes  Medications obtained and verified? Yes  Other? No  Any new allergies since your discharge? No  Dietary orders reviewed? Yes Do you have support at home? Yes   Home Care and Equipment/Supplies: Were home health services ordered? yes If so, what is the name of the agency? [facility]  Has the agency set up a time to come to the patient's home? yes Were any new equipment or medical supplies ordered?  No What is the name of the medical supply agency? N/A Were you able to get the supplies/equipment? not applicable Do you have any questions related to the use of the equipment or supplies? No  Functional Questionnaire: (I = Independent and D = Dependent) ADLs: D  Bathing/Dressing- D  Meal Prep- D  Eating- D  Maintaining continence- D  Transferring/Ambulation- D  Managing Meds- D  Follow up appointments reviewed:  PCP Hospital f/u appt confirmed? Yes  Scheduled to see [place] PA-C on 05/11/22 @ 1450. [facility] f/u appt confirmed? Yes  Scheduled to see Urology 04/21/22 Are transportation arrangements needed? Yes , nurse sent referral to community care guide for assistance If their condition worsens, is the pt aware to call PCP or go to the Emergency Dept.? Yes Was  the patient provided with contact information for the PCP's office or ED? Yes Was to pt encouraged to call back with questions or concerns? Yes  SDOH assessments and interventions completed:   Yes  Care Coordination Interventions Activated:  No Care Coordination Interventions:   N/A  Encounter Outcome:  Pt. Visit Completed  Emelia Loron RN, BSN [place] [phone] [email or web address]

## 2022-04-19 NOTE — Telephone Encounter (Signed)
Home Health Verbal Orders - Caller/Agency: Ferne Coe Number: [phone] Requesting OT/PT/Skilled Nursing/Social Work/Speech Therapy: PT  Frequency:   PT Eval, for home safety, has a lift (needs training)

## 2022-04-20 ENCOUNTER — Telehealth: Payer: Self-pay

## 2022-04-20 ENCOUNTER — Telehealth: Payer: Self-pay | Admitting: Family Medicine

## 2022-04-20 NOTE — Telephone Encounter (Signed)
   Telephone encounter was:  Unsuccessful.  04/20/2022 Name: SIMONE TUCKEY MRN: [id number] DOB: Feb 21, 1951  Unsuccessful outbound call made today to assist with:  Transportation Needs   Outreach Attempt:  1st Attempt  A HIPAA compliant voice message was left requesting a return call.  Instructed patient to call back at [phone]. Left message on mobile voicemail for patient to return my call regarding transportation.  Unable to leave message on home voicemail. I will attempt to call patient again tomorrow 04/21/22.  Maquita Sandoval, AAS Paralegal, [facility]  [address], [place] [address] ??[email or web address]  ?? [phone]   [email or web address]

## 2022-04-20 NOTE — Telephone Encounter (Signed)
Verbal orders given to [facility] at [facility] for the requested orders

## 2022-04-20 NOTE — Telephone Encounter (Signed)
Verbal order were given for patient. 

## 2022-04-20 NOTE — Telephone Encounter (Signed)
Home Health Verbal Orders - Caller/Agency: Alona Bene Number: [phone] Requesting OT/PT/Skilled Nursing/Social Work/Speech Therapy:  Frequency:   Nursing:  1w1 2w1  1w1  Has a stage 2 pressure ulcer on both left and right buttocks Foam dressing, changed every 3-5 days as needed New Catheter, sees urology tomorrow Will follow up on catheter needs after appt  Social Worker for [employer]

## 2022-04-21 ENCOUNTER — Telehealth: Payer: Self-pay

## 2022-04-21 NOTE — Progress Notes (Unsigned)
Electrophysiology Office Note Date: 04/25/2022  ID:  Darrell [name], Darrell [name] 10/01/1950, MRN [id number]  PCP: [name], MD Primary Cardiologist: Will Meredith Leeds, MD Electrophysiologist: Constance Haw, MD   CC: Pacemaker follow-up  Darrell [name] is a 71 y.o. male seen today for Will Meredith Leeds, MD for routine electrophysiology followup.  Since last being seen in our clinic the patient reports doing very well. He is WC bound and has an aide/RN that comes out twice a week.  he denies chest pain, palpitations, dyspnea, PND, orthopnea, nausea, vomiting, dizziness, syncope, edema, weight gain, or early satiety.  Device History: Medtronic Dual Chamber PPM implanted 12/2016 for second degree AV block  Past Medical History:  Diagnosis Date   Arthritis    Asthma    At high risk for falls 08/16/2015   CIDP (chronic inflammatory demyelinating polyneuropathy) ([name])    CKD (chronic kidney disease) stage 3, GFR 30-59 ml/min (IXL) 08/16/2015   Dysphagia as late effect of cerebrovascular disease    pts wife states pt has to eat soft foods    Elevated liver enzymes 08/10/2016   GERD (gastroesophageal reflux disease)    Glaucoma    High cholesterol    History of CVA with residual deficit 03/25/2013   Hypertension    Hypertensive retinopathy of both eyes 01/16/2017   Inguinal hernia 03/25/2013   Liver hemangioma 08/14/2016   New onset seizure ([place]) 07/08/2017   seizure 07/14/18   Nuclear sclerosis of both eyes 10/25/2016   Pneumonia    Presence of permanent cardiac pacemaker    Primary open angle glaucoma of both eyes, indeterminate stage 10/25/2016   Renal mass, right 08/14/2016   Status cardiac pacemaker 01/29/2017   Placed for second degree heart block on 01/16/17 Medtronic Azure XT DR MRI SureScan dual-chamber pacemaker   Stroke [facility])    2011 with residual deficit left sided weakness   Tobacco dependence    Past Surgical History:  Procedure Laterality Date   EYE  SURGERY     HERNIA REPAIR     INGUINAL HERNIA REPAIR Right 11/23/2014   Procedure: right inguinal hernia repair with mesh;  Surgeon: Armandina Gemma, MD;  Location: [facility];  Service: General;  Laterality: Right;   INSERTION OF MESH N/A 11/23/2014   Procedure: INSERTION OF MESH;  Surgeon: Armandina Gemma, MD;  Location: [facility];  Service: General;  Laterality: N/A;   JOINT REPLACEMENT     MASS EXCISION Left 08/29/2017   Procedure: EXCISION OF LEFT NECK MASS;  Surgeon: Coralie Keens, MD;  Location: [facility];  Service: General;  Laterality: Left;   PACEMAKER IMPLANT N/A 01/16/2017   Procedure: Pacemaker Implant;  Surgeon: Will Meredith Leeds, MD;  Location: [facility];  Service: Cardiovascular;  Laterality: N/A;   SHOULDER SURGERY Bilateral 1988, 1998    Current Outpatient Medications  Medication Sig Dispense Refill   acetaminophen (TYLENOL) 325 MG tablet Take 325 mg by mouth daily as needed (pain).     albuterol (VENTOLIN HFA) 108 (90 Base) MCG/ACT inhaler Inhale 2 puffs into the lungs every 6 (six) hours as needed for up to 30 days for Wheezing. 18 g 5   amLODipine (NORVASC) 10 MG tablet Take 1 tablet by mouth once daily 90 tablet 0   atorvastatin (LIPITOR) 40 MG tablet Take 1 tablet (40 mg total) by mouth daily. 90 tablet 1   azithromycin (ZITHROMAX) 500 MG tablet Take 1 tablet (500 mg total) by mouth See admin instructions. Take 1  tablet by mouth Monday,Wednesday and Friday 12 tablet 2   brimonidine (ALPHAGAN) 0.2 % ophthalmic solution Place 1 drop into both eyes daily.     cetirizine (ZYRTEC) 10 MG tablet Take 1 tablet (10 mg total) by mouth daily. 90 tablet 1   dicyclomine (BENTYL) 10 MG capsule Take 10 mg by mouth daily as needed (pain).     dorzolamide-timolol (COSOPT) 22.3-6.8 MG/ML ophthalmic solution Place 1 drop into both eyes daily in the afternoon.     ferrous sulfate (FEROSUL) 325 (65 FE) MG tablet Take 1 tablet (325 mg total) by mouth 2 (two) times daily with a meal. 60 tablet 2    finasteride (PROSCAR) 5 MG tablet Take 5 mg by mouth daily.     fluticasone furoate-vilanterol (BREO ELLIPTA) 200-25 MCG/ACT AEPB Inhale 2 puffs into the lungs daily.     glycopyrrolate (ROBINUL) 1 MG tablet Take 1 tablet (1 mg total) by mouth 2 times daily. 60 tablet 5   hydrALAZINE (APRESOLINE) 10 MG tablet TAKE 1 TABLET BY MOUTH THREE TIMES DAILY 90 tablet 0   latanoprost (XALATAN) 0.005 % ophthalmic solution Place 1 drop into both eyes at bedtime.     levETIRAcetam (KEPPRA) 500 MG tablet Take 1 tablet (500 mg total) by mouth 2 (two) times daily. 180 tablet 4   metoprolol succinate (TOPROL-XL) 50 MG 24 hr tablet Take 1 tablet (50 mg total) by mouth at bedtime. Take with or immediately following a meal. 30 tablet 3   Misc. Devices MISC Yankauer suction. Dx: ALS 1 each 0   Misc. Devices MISC Coughalator 1 each 0   Misc. Devices MISC Nebulizer machine.  Diagnosis- Motor neuron disease 1 each 0   Misc. Devices [facility] Bed DXg12.20 1 each 0   Misc. [facility] bed extension. Diagnosis: ALS. Height 6'4" 1 each 0   Misc. Devices MISC Manual wheelchair.  Diagnosis-ALS 1 each 0   Misc. Devices MISC Right and left wrist splint 1 each 0   montelukast (SINGULAIR) 10 MG tablet Take 10 mg by mouth at bedtime.     Multiple Vitamin (MULTI-VITAMIN) tablet Take 1 tablet by mouth daily.     polyethylene glycol (MIRALAX) 17 g packet Take 17 g by mouth daily. 14 each 0   tamsulosin (FLOMAX) 0.4 MG CAPS capsule Take 1 capsule (0.4 mg total) by mouth daily. 90 capsule 1   torsemide (DEMADEX) 20 MG tablet Take 20 mg by mouth daily as needed (fluid).     umeclidinium bromide (INCRUSE ELLIPTA) 62.5 MCG/ACT AEPB Inhale 2 puffs into the lungs daily.     No current facility-administered medications for this visit.    Allergies:   Ace inhibitors, Doxycycline, Atacand hct [candesartan cilexetil-hctz], and Shellfish allergy   Social History: Social History   Socioeconomic History    Marital status: Married    Spouse name: [name]   Number of children: 1   Years of education: college   Highest education level: Not on file  Occupational History   Occupation: retired  Tobacco Use   Smoking status: Every Day    Packs/day: 1.00    Years: 40.00    Total pack years: 40.00    Types: Cigarettes   Smokeless tobacco: Never   Tobacco comments:    12/13/20 5 a day  Vaping Use   Vaping Use: Never used  Substance and Sexual Activity   Alcohol use: No    Comment: alcohol free for 1 year was drinking 1/2 pint per  day    Drug use: No   Sexual activity: Not on file  Other Topics Concern   Not on file  Social History Narrative   09/13/17 Patient lives at home with spouse.   Has 39 yo daughter   No grandchildren   Social Determinants of Radio broadcast assistant Strain: Not on file  Food Insecurity: Not on file  Transportation Needs: No Transportation Needs (04/21/2022)   PRAPARE - Hydrologist (Medical): No    Lack of Transportation (Non-Medical): No  Physical Activity: Not on file  Stress: Not on file  Social Connections: Not on file  Intimate Partner Violence: Not on file    Family History: Family History  Problem Relation Age of Onset   Hypertension Mother    Diabetes Sister    Hypertension Sister    Cancer Sister        1 sister   COPD Sister        in 1 sister   Stomach cancer Neg Hx    Colon cancer Neg Hx    Pancreatic cancer Neg Hx    Esophageal cancer Neg Hx      Review of Systems: All other systems reviewed and are otherwise negative except as noted above.  Physical Exam: Vitals:   04/25/22 1028  BP: (!) 148/72  Pulse: 60  SpO2: 96%  Weight: 179 lb (81.2 kg)  Height: 6' 3.5" (1.918 m)     GEN- The patient is well appearing, alert and oriented x 3 today.   HEENT: normocephalic, atraumatic; sclera clear, conjunctiva pink; hearing intact; oropharynx clear; neck supple  Lungs- Clear to ausculation  bilaterally, normal work of breathing.  No wheezes, rales, rhonchi Heart- Regular rate and rhythm, no murmurs, rubs or gallops  GI- soft, non-tender, non-distended, bowel sounds present  Extremities- no clubbing or cyanosis. No edema MS- no significant deformity or atrophy Skin- warm and dry, no rash or lesion; PPM pocket well healed Psych- euthymic mood, full affect Neuro- strength and sensation are intact  PPM Interrogation- reviewed in detail today,  See PACEART report  EKG:  EKG is not ordered today. Personal review of ekg ordered  04/13/2022  shows intermittent V pacing at 86 bpm, no PVCs   Recent Labs: 05/06/2021: TSH 1.416 06/08/2021: Magnesium 2.0 04/12/2022: ALT 11 04/14/2022: Hemoglobin 9.1; Platelets 196 04/15/2022: BUN 39; Creatinine, Ser 2.72; Potassium 4.7; Sodium 137   Wt Readings from Last 3 Encounters:  04/25/22 179 lb (81.2 kg)  03/23/22 170 lb 13.7 oz (77.5 kg)  10/11/21 171 lb 4.8 oz (77.7 kg)     Other studies Reviewed: Additional studies/ records that were reviewed today include: Previous EP office notes, Previous remote checks, Most recent labwork.   Assessment and Plan:  1. Second Degree AV block s/p Medtronic PPM  Normal PPM function See Pace Art report No changes today  2. HTN Elevated despite recent addition of metoprol Increase hydralazine to 25 mg TID. He sees PCP in 2 weeks. Cnsider up-titration further if remains above goal  3. PVCs None noted on EKG 04/13/2022 Plan for wearable monitor if concerns for symptoms to assess burden.   Current medicines are reviewed at length with the patient today.    Disposition:   Follow up with Dr. Curt Bears in 6 months    Signed, Shirley Friar, PA-C  04/25/2022 10:29 AM  [facility] [address] [place] [address] [phone] (office) [phone] (fax)

## 2022-04-21 NOTE — Telephone Encounter (Signed)
   Telephone encounter was:  Unsuccessful.  04/21/2022 Name: Darrell [name] MRN: [id number] DOB: Feb 01, 1951  Unsuccessful outbound call made today to assist with:  Transportation Needs   Outreach Attempt:  2nd Attempt  A HIPAA compliant voice message was left requesting a return call.  Instructed patient to call back at [phone]. Scheduled transportation for 05/11/22 2:50pm appointment with [place], [place] [id number], 2:18pm pick up time.  Transportation is scheduled with Environmental manager. Scheduled ride for 05/18/22 appointment [place] ID [id number], pick up time is 1:13pm with [employer].  Jewelz Ricklefs, AAS Paralegal, [facility]  [address], [place] [address] ??[email or web address]  ?? [phone]   [email or web address]

## 2022-04-21 NOTE — Telephone Encounter (Signed)
Telephone encounter was:  Successful.  04/21/2022 Name: Darrell [name] MRN: [id number] DOB: 01-Feb-1951  Darrell [name] is a 71 y.o. year old male who is a primary care patient of Darrell Rakes, MD . The community resource team was consulted for assistance with Transportation Needs   Care guide performed the following interventions: Spoke with patient's spouse Darrell [name], she was able to get transportation for the 04/21/22 appointment.     Scheduled transportation for 04/25/22 10:20am appointment, Ride ID [phone], ride is scheduled with [employer] pick up time 9:49am. Faxed rider waiver form to [facility] for patient to sign.  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   Follow Up Plan:   I  will call the patient's spouse Darrell [name] to ensure she is aware of pick up time for 04/25/22 appointment.  Darrell [name], AAS Paralegal, [facility] [employer]  [address], [place] [address] ??[name]  ?? [phone]   [email or web address]  04/21/2022  Darrell [name] DOB: 1950-10-20 MRN: [id number]   RIDER WAIVER AND RELEASE OF LIABILITY  For purposes of improving physical access to our facilities, [facility] is pleased to partner with third parties to provide [facility] patients or other authorized individuals the option of convenient, on-demand ground transportation services (the Technical brewer") through use of the technology service that enables users to request on-demand ground transportation from independent third-party providers.  By opting to use and accept these [employer], I, the undersigned, hereby agree on behalf of myself, and on behalf of any minor child using the Government social research officer for whom I am the parent or legal guardian, as follows:  Government social research officer provided to me are provided by independent third-party transportation providers  who are not [employer] or employees and who are unaffiliated with [employer]. [facility] is neither a transportation carrier nor a common or public carrier. [facility] has no control over the quality or safety of the transportation that occurs as a result of the [employer]. [facility] cannot guarantee that any third-party transportation provider will complete any arranged transportation service. [facility] makes no representation, warranty, or guarantee regarding the reliability, timeliness, quality, safety, suitability, or availability of any of the Transport Services or that they will be error free. I fully understand that traveling by vehicle involves risks and dangers of serious bodily injury, including permanent disability, paralysis, and death. I agree, on behalf of myself and on behalf of any minor child using the Transport Services for whom I am the parent or legal guardian, that the entire risk arising out of my use of the [employer] remains solely with me, to the maximum extent permitted under applicable law. The [employer] are provided "as is" and "as available." [facility] disclaims all representations and warranties, express, implied or statutory, not expressly set out in these terms, including the implied warranties of merchantability and fitness for a particular purpose. I hereby waive and release [facility], its agents, employees, officers, directors, representatives, insurers, attorneys, assigns, successors, subsidiaries, and affiliates from any and all past, present, or future claims, demands, liabilities, actions, causes of action, or suits of any kind directly or indirectly arising from acceptance and use of the [employer]. I further waive and release  and its affiliates from all present and future liability and responsibility for any injury or death to persons or damages to property caused by or  related to the use of the [employer]. I have read this Waiver and Release of Liability, and I understand the terms used in it and their legal significance. This Waiver is freely and voluntarily given with the understanding that my right (as well as the right of any minor child for whom I am the parent or legal guardian using the [employer]) to legal recourse against [facility] in connection with the [employer] is knowingly surrendered in return for use of these services.   I attest that I read the Ride Waiver and Release of Liability to Darrell [name], gave Mr. Darrell [name] the opportunity to ask questions and answered the questions asked (if any). I affirm that Darrell [name] then provided consent for assistance with transportation.     Darrell [name] D Darrell [name]

## 2022-04-21 NOTE — Telephone Encounter (Signed)
PT called in to make provider aware that services will start next week and the following week.

## 2022-04-24 ENCOUNTER — Other Ambulatory Visit: Payer: Self-pay | Admitting: Family Medicine

## 2022-04-24 DIAGNOSIS — J159 Unspecified bacterial pneumonia: Secondary | ICD-10-CM | POA: Diagnosis not present

## 2022-04-24 DIAGNOSIS — N184 Chronic kidney disease, stage 4 (severe): Secondary | ICD-10-CM | POA: Diagnosis not present

## 2022-04-24 DIAGNOSIS — J440 Chronic obstructive pulmonary disease with acute lower respiratory infection: Secondary | ICD-10-CM | POA: Diagnosis not present

## 2022-04-24 DIAGNOSIS — F17200 Nicotine dependence, unspecified, uncomplicated: Secondary | ICD-10-CM | POA: Diagnosis not present

## 2022-04-24 DIAGNOSIS — G1221 Amyotrophic lateral sclerosis: Secondary | ICD-10-CM | POA: Diagnosis not present

## 2022-04-24 DIAGNOSIS — I498 Other specified cardiac arrhythmias: Secondary | ICD-10-CM | POA: Diagnosis not present

## 2022-04-24 DIAGNOSIS — Z466 Encounter for fitting and adjustment of urinary device: Secondary | ICD-10-CM | POA: Diagnosis not present

## 2022-04-24 DIAGNOSIS — I129 Hypertensive chronic kidney disease with stage 1 through stage 4 chronic kidney disease, or unspecified chronic kidney disease: Secondary | ICD-10-CM | POA: Diagnosis not present

## 2022-04-24 DIAGNOSIS — U071 COVID-19: Secondary | ICD-10-CM | POA: Diagnosis not present

## 2022-04-24 DIAGNOSIS — I10 Essential (primary) hypertension: Secondary | ICD-10-CM

## 2022-04-24 DIAGNOSIS — G9341 Metabolic encephalopathy: Secondary | ICD-10-CM | POA: Diagnosis not present

## 2022-04-24 DIAGNOSIS — N401 Enlarged prostate with lower urinary tract symptoms: Secondary | ICD-10-CM | POA: Diagnosis not present

## 2022-04-24 DIAGNOSIS — Z9181 History of falling: Secondary | ICD-10-CM | POA: Diagnosis not present

## 2022-04-24 DIAGNOSIS — R338 Other retention of urine: Secondary | ICD-10-CM | POA: Diagnosis not present

## 2022-04-24 DIAGNOSIS — D631 Anemia in chronic kidney disease: Secondary | ICD-10-CM | POA: Diagnosis not present

## 2022-04-24 DIAGNOSIS — G40909 Epilepsy, unspecified, not intractable, without status epilepticus: Secondary | ICD-10-CM | POA: Diagnosis not present

## 2022-04-24 DIAGNOSIS — I69391 Dysphagia following cerebral infarction: Secondary | ICD-10-CM | POA: Diagnosis not present

## 2022-04-25 ENCOUNTER — Ambulatory Visit (INDEPENDENT_AMBULATORY_CARE_PROVIDER_SITE_OTHER): Payer: Medicare HMO | Admitting: Student

## 2022-04-25 ENCOUNTER — Encounter: Payer: Self-pay | Admitting: Student

## 2022-04-25 VITALS — BP 148/72 | HR 60 | Ht 75.5 in | Wt 179.0 lb

## 2022-04-25 DIAGNOSIS — I493 Ventricular premature depolarization: Secondary | ICD-10-CM

## 2022-04-25 DIAGNOSIS — I459 Conduction disorder, unspecified: Secondary | ICD-10-CM | POA: Diagnosis not present

## 2022-04-25 DIAGNOSIS — I441 Atrioventricular block, second degree: Secondary | ICD-10-CM

## 2022-04-25 DIAGNOSIS — I10 Essential (primary) hypertension: Secondary | ICD-10-CM | POA: Diagnosis not present

## 2022-04-25 LAB — CUP PACEART INCLINIC DEVICE CHECK
Battery Remaining Longevity: 62 mo
Battery Voltage: 2.96 V
Brady Statistic AP VP Percent: 17.13 %
Brady Statistic AP VS Percent: 2.02 %
Brady Statistic AS VP Percent: 51.78 %
Brady Statistic AS VS Percent: 29.07 %
Brady Statistic RA Percent Paced: 22 %
Brady Statistic RV Percent Paced: 68.91 %
Date Time Interrogation Session: 20230801104759
Implantable Lead Implant Date: 20180424
Implantable Lead Implant Date: 20180424
Implantable Lead Location: 753859
Implantable Lead Location: 753860
Implantable Lead Model: 5076
Implantable Lead Model: 5076
Implantable Pulse Generator Implant Date: 20180424
Lead Channel Impedance Value: 266 Ohm
Lead Channel Impedance Value: 304 Ohm
Lead Channel Impedance Value: 380 Ohm
Lead Channel Impedance Value: 399 Ohm
Lead Channel Pacing Threshold Amplitude: 0.5 V
Lead Channel Pacing Threshold Amplitude: 0.625 V
Lead Channel Pacing Threshold Pulse Width: 0.4 ms
Lead Channel Pacing Threshold Pulse Width: 0.4 ms
Lead Channel Sensing Intrinsic Amplitude: 1.75 mV
Lead Channel Sensing Intrinsic Amplitude: 2.25 mV
Lead Channel Sensing Intrinsic Amplitude: 6.125 mV
Lead Channel Sensing Intrinsic Amplitude: 7.75 mV
Lead Channel Setting Pacing Amplitude: 1.5 V
Lead Channel Setting Pacing Amplitude: 2.5 V
Lead Channel Setting Pacing Pulse Width: 0.4 ms
Lead Channel Setting Sensing Sensitivity: 2 mV

## 2022-04-25 MED ORDER — HYDRALAZINE HCL 25 MG PO TABS: 25.0000 mg | ORAL_TABLET | Freq: Three times a day (TID) | ORAL | 3 refills | Status: DC

## 2022-04-25 NOTE — Patient Instructions (Signed)
Medication Instructions:  Your physician has recommended you make the following change in your medication:   INCREASE: Hydralazine to '25mg'$  three times daily  *If you need a refill on your cardiac medications before your next appointment, please call your pharmacy*   Lab Work: None If you have labs (blood work) drawn today and your tests are completely normal, you will receive your results only by: [facility] (if you have MyChart) OR A paper copy in the mail If you have any lab test that is abnormal or we need to change your treatment, we will call you to review the results.   Follow-Up: At [name], you and your health needs are our priority.  As part of our continuing mission to provide you with exceptional heart care, we have created designated Provider Care Teams.  These Care Teams include your primary Cardiologist (physician) and Advanced Practice Providers (APPs -  Physician Assistants and Nurse Practitioners) who all work together to provide you with the care you need, when you need it.   Your next appointment:   6 month(s)  The format for your next appointment:   In Person  Provider:   Allegra Lai, MD

## 2022-04-26 ENCOUNTER — Telehealth: Payer: Self-pay

## 2022-04-26 ENCOUNTER — Telehealth: Payer: Self-pay | Admitting: Family Medicine

## 2022-04-26 NOTE — Telephone Encounter (Signed)
Verbal orders given to [facility] for PT.

## 2022-04-26 NOTE — Telephone Encounter (Signed)
   Telephone encounter was:  Unsuccessful.  04/26/2022 Name: Darrell [name] MRN: [id number] DOB: [birth year]  Unsuccessful outbound call made today to assist with:  Transportation Needs   Outreach Attempt:  3rd Attempt.  Referral closed unable to contact patient.  A HIPAA compliant voice message was left requesting a return call.  Instructed patient to call back at [phone]. Left message on voicemail reminding patient's wife [name] that she will be contacted closer to the date of patient's appointments through 8/24.    Indiana Pechacek, AAS Paralegal, [facility]  [address], [place] [address] ??[email or web address]  ?? [phone]   [email or web address]

## 2022-04-26 NOTE — Telephone Encounter (Signed)
Home Health Verbal Orders - Caller/Agency: [facility]  Callback Number: [phone] vm can be left  Requesting PT Frequency: 1x a week for 4 weeks

## 2022-04-27 DIAGNOSIS — G1221 Amyotrophic lateral sclerosis: Secondary | ICD-10-CM | POA: Diagnosis not present

## 2022-04-27 DIAGNOSIS — F17200 Nicotine dependence, unspecified, uncomplicated: Secondary | ICD-10-CM | POA: Diagnosis not present

## 2022-04-27 DIAGNOSIS — N401 Enlarged prostate with lower urinary tract symptoms: Secondary | ICD-10-CM | POA: Diagnosis not present

## 2022-04-27 DIAGNOSIS — D631 Anemia in chronic kidney disease: Secondary | ICD-10-CM | POA: Diagnosis not present

## 2022-04-27 DIAGNOSIS — N184 Chronic kidney disease, stage 4 (severe): Secondary | ICD-10-CM | POA: Diagnosis not present

## 2022-04-27 DIAGNOSIS — U071 COVID-19: Secondary | ICD-10-CM | POA: Diagnosis not present

## 2022-04-27 DIAGNOSIS — J159 Unspecified bacterial pneumonia: Secondary | ICD-10-CM | POA: Diagnosis not present

## 2022-04-27 DIAGNOSIS — Z466 Encounter for fitting and adjustment of urinary device: Secondary | ICD-10-CM | POA: Diagnosis not present

## 2022-04-27 DIAGNOSIS — I129 Hypertensive chronic kidney disease with stage 1 through stage 4 chronic kidney disease, or unspecified chronic kidney disease: Secondary | ICD-10-CM | POA: Diagnosis not present

## 2022-04-27 DIAGNOSIS — R338 Other retention of urine: Secondary | ICD-10-CM | POA: Diagnosis not present

## 2022-04-27 DIAGNOSIS — Z9181 History of falling: Secondary | ICD-10-CM | POA: Diagnosis not present

## 2022-04-27 DIAGNOSIS — J440 Chronic obstructive pulmonary disease with acute lower respiratory infection: Secondary | ICD-10-CM | POA: Diagnosis not present

## 2022-04-27 DIAGNOSIS — G9341 Metabolic encephalopathy: Secondary | ICD-10-CM | POA: Diagnosis not present

## 2022-04-27 DIAGNOSIS — I498 Other specified cardiac arrhythmias: Secondary | ICD-10-CM | POA: Diagnosis not present

## 2022-04-27 DIAGNOSIS — I69391 Dysphagia following cerebral infarction: Secondary | ICD-10-CM | POA: Diagnosis not present

## 2022-04-27 DIAGNOSIS — G40909 Epilepsy, unspecified, not intractable, without status epilepticus: Secondary | ICD-10-CM | POA: Diagnosis not present

## 2022-04-27 NOTE — Progress Notes (Signed)
SOCIAL WORK TELEPHONIC ENCOUNTER SW followed up with patient's wife to verify his insurance and his involvement with the ALS clinic. Patient has [employer]. He is involved with the ALS clinic at [place]. His wife advised that patient has not been to the clinic in a long time and she planned to make an appointment soon. SW encouraged her to reach out to the clinic as they have a lot resources that could assist with their DME needs and transportation. She stated that she would call. SW asked if she could reach out to the clinic on behalf of the patient and she stated that this will be okay. SW advised her that she will follow-up with her in a few weeks, but encouraged her to call with any questions or concerns.    [address] [place], [place]

## 2022-05-02 DIAGNOSIS — G40909 Epilepsy, unspecified, not intractable, without status epilepticus: Secondary | ICD-10-CM | POA: Diagnosis not present

## 2022-05-02 DIAGNOSIS — R338 Other retention of urine: Secondary | ICD-10-CM | POA: Diagnosis not present

## 2022-05-02 DIAGNOSIS — U071 COVID-19: Secondary | ICD-10-CM | POA: Diagnosis not present

## 2022-05-02 DIAGNOSIS — F17200 Nicotine dependence, unspecified, uncomplicated: Secondary | ICD-10-CM | POA: Diagnosis not present

## 2022-05-02 DIAGNOSIS — J440 Chronic obstructive pulmonary disease with acute lower respiratory infection: Secondary | ICD-10-CM | POA: Diagnosis not present

## 2022-05-02 DIAGNOSIS — N184 Chronic kidney disease, stage 4 (severe): Secondary | ICD-10-CM | POA: Diagnosis not present

## 2022-05-02 DIAGNOSIS — I498 Other specified cardiac arrhythmias: Secondary | ICD-10-CM | POA: Diagnosis not present

## 2022-05-02 DIAGNOSIS — I129 Hypertensive chronic kidney disease with stage 1 through stage 4 chronic kidney disease, or unspecified chronic kidney disease: Secondary | ICD-10-CM | POA: Diagnosis not present

## 2022-05-02 DIAGNOSIS — G1221 Amyotrophic lateral sclerosis: Secondary | ICD-10-CM | POA: Diagnosis not present

## 2022-05-02 DIAGNOSIS — D631 Anemia in chronic kidney disease: Secondary | ICD-10-CM | POA: Diagnosis not present

## 2022-05-02 DIAGNOSIS — G9341 Metabolic encephalopathy: Secondary | ICD-10-CM | POA: Diagnosis not present

## 2022-05-02 DIAGNOSIS — N401 Enlarged prostate with lower urinary tract symptoms: Secondary | ICD-10-CM | POA: Diagnosis not present

## 2022-05-02 DIAGNOSIS — J159 Unspecified bacterial pneumonia: Secondary | ICD-10-CM | POA: Diagnosis not present

## 2022-05-02 DIAGNOSIS — Z466 Encounter for fitting and adjustment of urinary device: Secondary | ICD-10-CM | POA: Diagnosis not present

## 2022-05-02 DIAGNOSIS — I69391 Dysphagia following cerebral infarction: Secondary | ICD-10-CM | POA: Diagnosis not present

## 2022-05-02 DIAGNOSIS — Z9181 History of falling: Secondary | ICD-10-CM | POA: Diagnosis not present

## 2022-05-03 DIAGNOSIS — Z9181 History of falling: Secondary | ICD-10-CM | POA: Diagnosis not present

## 2022-05-03 DIAGNOSIS — F17200 Nicotine dependence, unspecified, uncomplicated: Secondary | ICD-10-CM | POA: Diagnosis not present

## 2022-05-03 DIAGNOSIS — G1221 Amyotrophic lateral sclerosis: Secondary | ICD-10-CM | POA: Diagnosis not present

## 2022-05-03 DIAGNOSIS — R69 Illness, unspecified: Secondary | ICD-10-CM | POA: Diagnosis not present

## 2022-05-03 DIAGNOSIS — E611 Iron deficiency: Secondary | ICD-10-CM | POA: Diagnosis not present

## 2022-05-03 DIAGNOSIS — G40909 Epilepsy, unspecified, not intractable, without status epilepticus: Secondary | ICD-10-CM | POA: Diagnosis not present

## 2022-05-03 DIAGNOSIS — U071 COVID-19: Secondary | ICD-10-CM | POA: Diagnosis not present

## 2022-05-03 DIAGNOSIS — N401 Enlarged prostate with lower urinary tract symptoms: Secondary | ICD-10-CM | POA: Diagnosis not present

## 2022-05-03 DIAGNOSIS — N400 Enlarged prostate without lower urinary tract symptoms: Secondary | ICD-10-CM | POA: Diagnosis not present

## 2022-05-03 DIAGNOSIS — H409 Unspecified glaucoma: Secondary | ICD-10-CM | POA: Diagnosis not present

## 2022-05-03 DIAGNOSIS — G9341 Metabolic encephalopathy: Secondary | ICD-10-CM | POA: Diagnosis not present

## 2022-05-03 DIAGNOSIS — I498 Other specified cardiac arrhythmias: Secondary | ICD-10-CM | POA: Diagnosis not present

## 2022-05-03 DIAGNOSIS — J159 Unspecified bacterial pneumonia: Secondary | ICD-10-CM | POA: Diagnosis not present

## 2022-05-03 DIAGNOSIS — J309 Allergic rhinitis, unspecified: Secondary | ICD-10-CM | POA: Diagnosis not present

## 2022-05-03 DIAGNOSIS — J449 Chronic obstructive pulmonary disease, unspecified: Secondary | ICD-10-CM | POA: Diagnosis not present

## 2022-05-03 DIAGNOSIS — I10 Essential (primary) hypertension: Secondary | ICD-10-CM | POA: Diagnosis not present

## 2022-05-03 DIAGNOSIS — K589 Irritable bowel syndrome without diarrhea: Secondary | ICD-10-CM | POA: Diagnosis not present

## 2022-05-03 DIAGNOSIS — R338 Other retention of urine: Secondary | ICD-10-CM | POA: Diagnosis not present

## 2022-05-03 DIAGNOSIS — Z466 Encounter for fitting and adjustment of urinary device: Secondary | ICD-10-CM | POA: Diagnosis not present

## 2022-05-03 DIAGNOSIS — D631 Anemia in chronic kidney disease: Secondary | ICD-10-CM | POA: Diagnosis not present

## 2022-05-03 DIAGNOSIS — I129 Hypertensive chronic kidney disease with stage 1 through stage 4 chronic kidney disease, or unspecified chronic kidney disease: Secondary | ICD-10-CM | POA: Diagnosis not present

## 2022-05-03 DIAGNOSIS — J440 Chronic obstructive pulmonary disease with acute lower respiratory infection: Secondary | ICD-10-CM | POA: Diagnosis not present

## 2022-05-03 DIAGNOSIS — N184 Chronic kidney disease, stage 4 (severe): Secondary | ICD-10-CM | POA: Diagnosis not present

## 2022-05-03 DIAGNOSIS — I69391 Dysphagia following cerebral infarction: Secondary | ICD-10-CM | POA: Diagnosis not present

## 2022-05-03 DIAGNOSIS — Z7401 Bed confinement status: Secondary | ICD-10-CM | POA: Diagnosis not present

## 2022-05-04 DIAGNOSIS — Z9181 History of falling: Secondary | ICD-10-CM | POA: Diagnosis not present

## 2022-05-04 DIAGNOSIS — R338 Other retention of urine: Secondary | ICD-10-CM | POA: Diagnosis not present

## 2022-05-04 DIAGNOSIS — N184 Chronic kidney disease, stage 4 (severe): Secondary | ICD-10-CM | POA: Diagnosis not present

## 2022-05-04 DIAGNOSIS — G1221 Amyotrophic lateral sclerosis: Secondary | ICD-10-CM | POA: Diagnosis not present

## 2022-05-04 DIAGNOSIS — U071 COVID-19: Secondary | ICD-10-CM | POA: Diagnosis not present

## 2022-05-04 DIAGNOSIS — G9341 Metabolic encephalopathy: Secondary | ICD-10-CM | POA: Diagnosis not present

## 2022-05-04 DIAGNOSIS — D631 Anemia in chronic kidney disease: Secondary | ICD-10-CM | POA: Diagnosis not present

## 2022-05-04 DIAGNOSIS — I69391 Dysphagia following cerebral infarction: Secondary | ICD-10-CM | POA: Diagnosis not present

## 2022-05-04 DIAGNOSIS — J440 Chronic obstructive pulmonary disease with acute lower respiratory infection: Secondary | ICD-10-CM | POA: Diagnosis not present

## 2022-05-04 DIAGNOSIS — G40909 Epilepsy, unspecified, not intractable, without status epilepticus: Secondary | ICD-10-CM | POA: Diagnosis not present

## 2022-05-04 DIAGNOSIS — N401 Enlarged prostate with lower urinary tract symptoms: Secondary | ICD-10-CM | POA: Diagnosis not present

## 2022-05-04 DIAGNOSIS — I498 Other specified cardiac arrhythmias: Secondary | ICD-10-CM | POA: Diagnosis not present

## 2022-05-04 DIAGNOSIS — Z466 Encounter for fitting and adjustment of urinary device: Secondary | ICD-10-CM | POA: Diagnosis not present

## 2022-05-04 DIAGNOSIS — F17200 Nicotine dependence, unspecified, uncomplicated: Secondary | ICD-10-CM | POA: Diagnosis not present

## 2022-05-04 DIAGNOSIS — J159 Unspecified bacterial pneumonia: Secondary | ICD-10-CM | POA: Diagnosis not present

## 2022-05-04 DIAGNOSIS — I129 Hypertensive chronic kidney disease with stage 1 through stage 4 chronic kidney disease, or unspecified chronic kidney disease: Secondary | ICD-10-CM | POA: Diagnosis not present

## 2022-05-05 DIAGNOSIS — I129 Hypertensive chronic kidney disease with stage 1 through stage 4 chronic kidney disease, or unspecified chronic kidney disease: Secondary | ICD-10-CM | POA: Diagnosis not present

## 2022-05-05 DIAGNOSIS — Z466 Encounter for fitting and adjustment of urinary device: Secondary | ICD-10-CM | POA: Diagnosis not present

## 2022-05-05 DIAGNOSIS — G1221 Amyotrophic lateral sclerosis: Secondary | ICD-10-CM | POA: Diagnosis not present

## 2022-05-05 DIAGNOSIS — I498 Other specified cardiac arrhythmias: Secondary | ICD-10-CM | POA: Diagnosis not present

## 2022-05-05 DIAGNOSIS — Z9181 History of falling: Secondary | ICD-10-CM | POA: Diagnosis not present

## 2022-05-05 DIAGNOSIS — J440 Chronic obstructive pulmonary disease with acute lower respiratory infection: Secondary | ICD-10-CM | POA: Diagnosis not present

## 2022-05-05 DIAGNOSIS — D631 Anemia in chronic kidney disease: Secondary | ICD-10-CM | POA: Diagnosis not present

## 2022-05-05 DIAGNOSIS — I69391 Dysphagia following cerebral infarction: Secondary | ICD-10-CM | POA: Diagnosis not present

## 2022-05-05 DIAGNOSIS — G40909 Epilepsy, unspecified, not intractable, without status epilepticus: Secondary | ICD-10-CM | POA: Diagnosis not present

## 2022-05-05 DIAGNOSIS — F17200 Nicotine dependence, unspecified, uncomplicated: Secondary | ICD-10-CM | POA: Diagnosis not present

## 2022-05-05 DIAGNOSIS — N184 Chronic kidney disease, stage 4 (severe): Secondary | ICD-10-CM | POA: Diagnosis not present

## 2022-05-05 DIAGNOSIS — U071 COVID-19: Secondary | ICD-10-CM | POA: Diagnosis not present

## 2022-05-05 DIAGNOSIS — J159 Unspecified bacterial pneumonia: Secondary | ICD-10-CM | POA: Diagnosis not present

## 2022-05-05 DIAGNOSIS — N401 Enlarged prostate with lower urinary tract symptoms: Secondary | ICD-10-CM | POA: Diagnosis not present

## 2022-05-05 DIAGNOSIS — G9341 Metabolic encephalopathy: Secondary | ICD-10-CM | POA: Diagnosis not present

## 2022-05-05 DIAGNOSIS — R338 Other retention of urine: Secondary | ICD-10-CM | POA: Diagnosis not present

## 2022-05-09 DIAGNOSIS — U071 COVID-19: Secondary | ICD-10-CM | POA: Diagnosis not present

## 2022-05-09 DIAGNOSIS — N184 Chronic kidney disease, stage 4 (severe): Secondary | ICD-10-CM | POA: Diagnosis not present

## 2022-05-09 DIAGNOSIS — J159 Unspecified bacterial pneumonia: Secondary | ICD-10-CM | POA: Diagnosis not present

## 2022-05-09 DIAGNOSIS — Z466 Encounter for fitting and adjustment of urinary device: Secondary | ICD-10-CM | POA: Diagnosis not present

## 2022-05-09 DIAGNOSIS — I69391 Dysphagia following cerebral infarction: Secondary | ICD-10-CM | POA: Diagnosis not present

## 2022-05-09 DIAGNOSIS — N401 Enlarged prostate with lower urinary tract symptoms: Secondary | ICD-10-CM | POA: Diagnosis not present

## 2022-05-09 DIAGNOSIS — G1221 Amyotrophic lateral sclerosis: Secondary | ICD-10-CM | POA: Diagnosis not present

## 2022-05-09 DIAGNOSIS — G40909 Epilepsy, unspecified, not intractable, without status epilepticus: Secondary | ICD-10-CM | POA: Diagnosis not present

## 2022-05-09 DIAGNOSIS — R338 Other retention of urine: Secondary | ICD-10-CM | POA: Diagnosis not present

## 2022-05-09 DIAGNOSIS — F17200 Nicotine dependence, unspecified, uncomplicated: Secondary | ICD-10-CM | POA: Diagnosis not present

## 2022-05-09 DIAGNOSIS — D631 Anemia in chronic kidney disease: Secondary | ICD-10-CM | POA: Diagnosis not present

## 2022-05-09 DIAGNOSIS — I129 Hypertensive chronic kidney disease with stage 1 through stage 4 chronic kidney disease, or unspecified chronic kidney disease: Secondary | ICD-10-CM | POA: Diagnosis not present

## 2022-05-09 DIAGNOSIS — G9341 Metabolic encephalopathy: Secondary | ICD-10-CM | POA: Diagnosis not present

## 2022-05-09 DIAGNOSIS — I498 Other specified cardiac arrhythmias: Secondary | ICD-10-CM | POA: Diagnosis not present

## 2022-05-09 DIAGNOSIS — Z9181 History of falling: Secondary | ICD-10-CM | POA: Diagnosis not present

## 2022-05-09 DIAGNOSIS — J440 Chronic obstructive pulmonary disease with acute lower respiratory infection: Secondary | ICD-10-CM | POA: Diagnosis not present

## 2022-05-10 DIAGNOSIS — J159 Unspecified bacterial pneumonia: Secondary | ICD-10-CM | POA: Diagnosis not present

## 2022-05-10 DIAGNOSIS — Z466 Encounter for fitting and adjustment of urinary device: Secondary | ICD-10-CM | POA: Diagnosis not present

## 2022-05-10 DIAGNOSIS — D631 Anemia in chronic kidney disease: Secondary | ICD-10-CM | POA: Diagnosis not present

## 2022-05-10 DIAGNOSIS — N401 Enlarged prostate with lower urinary tract symptoms: Secondary | ICD-10-CM | POA: Diagnosis not present

## 2022-05-10 DIAGNOSIS — F17200 Nicotine dependence, unspecified, uncomplicated: Secondary | ICD-10-CM | POA: Diagnosis not present

## 2022-05-10 DIAGNOSIS — J440 Chronic obstructive pulmonary disease with acute lower respiratory infection: Secondary | ICD-10-CM | POA: Diagnosis not present

## 2022-05-10 DIAGNOSIS — Z9181 History of falling: Secondary | ICD-10-CM | POA: Diagnosis not present

## 2022-05-10 DIAGNOSIS — U071 COVID-19: Secondary | ICD-10-CM | POA: Diagnosis not present

## 2022-05-10 DIAGNOSIS — I129 Hypertensive chronic kidney disease with stage 1 through stage 4 chronic kidney disease, or unspecified chronic kidney disease: Secondary | ICD-10-CM | POA: Diagnosis not present

## 2022-05-10 DIAGNOSIS — I498 Other specified cardiac arrhythmias: Secondary | ICD-10-CM | POA: Diagnosis not present

## 2022-05-10 DIAGNOSIS — N184 Chronic kidney disease, stage 4 (severe): Secondary | ICD-10-CM | POA: Diagnosis not present

## 2022-05-10 DIAGNOSIS — G1221 Amyotrophic lateral sclerosis: Secondary | ICD-10-CM | POA: Diagnosis not present

## 2022-05-10 DIAGNOSIS — R338 Other retention of urine: Secondary | ICD-10-CM | POA: Diagnosis not present

## 2022-05-10 DIAGNOSIS — I69391 Dysphagia following cerebral infarction: Secondary | ICD-10-CM | POA: Diagnosis not present

## 2022-05-10 DIAGNOSIS — G9341 Metabolic encephalopathy: Secondary | ICD-10-CM | POA: Diagnosis not present

## 2022-05-10 DIAGNOSIS — G40909 Epilepsy, unspecified, not intractable, without status epilepticus: Secondary | ICD-10-CM | POA: Diagnosis not present

## 2022-05-11 ENCOUNTER — Ambulatory Visit: Payer: Medicare Other | Admitting: Physician Assistant

## 2022-05-11 ENCOUNTER — Ambulatory Visit: Payer: Self-pay

## 2022-05-11 NOTE — Telephone Encounter (Signed)
  Chief Complaint: urinary pain Symptoms: urinary catheter in place, pain with urinating, diarrhea Frequency: 1 week Pertinent Negatives: NA Disposition: '[]'$ ED /'[x]'$ Urgent Care (no appt availability in office) / '[]'$ Appointment(In office/virtual)/ '[]'$  [facility]/ '[]'$ Home Care/ '[]'$ Refused Recommended Disposition /'[]'$ [facility]/ '[]'$  Follow-up with PCP Additional Notes: spoke with pt's wife and she concerned about urinary pain and him having catheter in place. Pt had FC placed in from hospital 04/12/22 and went to urology and they took it out, placed it back in d/t having retention. Pt has also had diarrhea for 3 weeks to where bottom is broke down. I advised Mrs Revard d/t no appts until 05/27/22 to call urology and see if they can see pt tomorrow or Monday and if not would go to UC to be seen. She verbalized understanding.   Reason for Disposition  New-onset MILD-MODERATE lower abdominal pain or swelling (distention)  Answer Assessment - Initial Assessment Questions 1. SYMPTOMS: "What symptoms are you concerned about?"     Having pain when urinating 2. ONSET:  "When did the symptoms start?"     1 week 4. ABDOMEN PAIN: "Is there any abdomen pain?" (e.g., Scale 1-10; or mild, moderate, severe)     no 5. URINE COLOR: "What color is the urine?"  "Is there blood present in the urine?" (e.g., clear, yellow, cloudy, tea-colored, blood streaks, bright red)     clear 6. URINE AMOUNT: "When did you last empty the urine from the collection bag?" "How much urine was in the bag at that time?" How much urine is in the collection bag now?"     Empty once daily when bag full  7. INSERTION: "How long have you (they) had the catheter?"     Was placed in hospital from 04/12/22, went to Urology appt and took out but had to replace d/t retention 8. OTHER SYMPTOMS: "Are there any other symptoms?" (e.g., abdomen swelling, back pain, bladder spasms, constipation, foul smelling urine, leaking of urine)       Diarrhea x 3 weeks  9. MEDICINES: "Are you taking any medicines to treat urinary problems?" (e.g., antibiotics for a urinary tract infection, medicines to treat bladder spasms)      No  Protocols used: Urinary Catheter (e.g., Foley) Symptoms and Questions-A-AH

## 2022-05-11 NOTE — Progress Notes (Deleted)
Patient ID: Darrell [name], male   DOB: 04-Feb-1951, 71 y.o.   MRN: [id number]    Ongoing care for CKD stage IV Home health RN for new Foley catheter secondary to urinary retention, suggest outpatient follow-up with urology   Discharge Diagnoses: Principal Problem:   Hyperkalemia Active Problems:   Essential hypertension   Tobacco abuse   History of CVA with residual deficit   Seizure disorder ([name])   Primary open angle glaucoma of both eyes, indeterminate stage   Mixed hyperlipidemia   ALS (amyotrophic lateral sclerosis) ([name])   CKD (chronic kidney disease), stage IV ([place])   DNR (do not resuscitate)   Resolved Problems:   * No resolved hospital problems. *   Hospital Course: 71 year old man PMH ALS CKD stage III presenting with nausea, vomiting and diarrhea.  Admitted for same, hyperkalemia.  Nausea, vomiting and diarrhea spontaneously resolved.  Hyperkalemia resolved with Lokelma.  Renal function stable.  Developed urinary retention.  Despite multiple in and out caths, persisted, therefore Foley catheter placed.  Follow-up as an outpatient.   Nausea, vomiting, diarrhea -- Spontaneously resolved.  Favor viral process.  Tolerating diet.     CKD stage IV with associated hyperkalemia --Creatinine appears to be at baseline, no evidence of AKI --potassium WNL s/p Lokelma.  No obvious offending medications.   Acute urinary retention, BPH --new dx. Continue Flomax, Proscar  -- Persisted despite multiple in and out caths.  Foley catheter placed.  Outpatient follow-up.     ALS -- Appears stable.   Anemia of CKD --stable   H/o CVA with dysphagia --Current diet is pureed with nectar-thick liquids --Continue Robinul   HTN --Stable.  Continue amlodipine, hydralazine Toprol XL   HLD -Continue Lipitor   Glaucoma -Continue brimonidine, Cosopt, latanoprost   Seizure d/o -Continue Keppra   COPD -Continue home meds - Albuterol, Breo, Incruse Ellipta -He is also on 3x/week  Azithromycin    Tobacco dependence -Encourage cessation -Patch ordered

## 2022-05-12 ENCOUNTER — Ambulatory Visit: Payer: Self-pay | Admitting: *Deleted

## 2022-05-12 MED ORDER — ONDANSETRON HCL 4 MG PO TABS: 4.0000 mg | ORAL_TABLET | Freq: Three times a day (TID) | ORAL | 0 refills | Status: DC | PRN

## 2022-05-12 NOTE — Telephone Encounter (Signed)
Summary: vomiting   Pt's wife states pt is vomiting and has an appt w/ urology on Mon 8-21 as recommended by NT on 8-17   Spouse is requesting a cb      Reason for Disposition  MILD or MODERATE vomiting (e.g., 1 - 5 times / day)  Answer Assessment - Initial Assessment Questions 1. VOMITING SEVERITY: "How many times have you vomited in the past 24 hours?"     - MILD:  1 - 2 times/day    - MODERATE: 3 - 5 times/day, decreased oral intake without significant weight loss or symptoms of dehydration    - SEVERE: 6 or more times/day, vomits everything or nearly everything, with significant weight loss, symptoms of dehydration      Mild/moderate 2. ONSET: "When did the vomiting begin?"      Early am 3. FLUIDS: "What fluids or food have you vomited up today?" "Have you been able to keep any fluids down?"     Sips only, no foods today 4. ABDOMEN PAIN: "Are your having any abdomen pain?" If Yes : "How bad is it and what does it feel like?" (e.g., crampy, dull, intermittent, constant)      No 5. DIARRHEA: "Is there any diarrhea?" If Yes, ask: "How many times today?"      Off/on- advised Pepto - just started 6. CONTACTS: "Is there anyone else in the family with the same symptoms?"      no 7. CAUSE: "What do you think is causing your vomiting?"     Patient feels Pepto  8. HYDRATION STATUS: "Any signs of dehydration?" (e.g., dry mouth [not only dry lips], too weak to stand) "When did you last urinate?"     Fatigue, wife is going increase gradually 9. OTHER SYMPTOMS: "Do you have any other symptoms?" (e.g., fever, headache, vertigo, vomiting blood or coffee grounds, recent head injury)     fatigue 10. PREGNANCY: "Is there any chance you are pregnant?" "When was your last menstrual period?"  Protocols used: Vomiting-A-AH

## 2022-05-12 NOTE — Telephone Encounter (Signed)
Left message on voicemail to return call for an appt.  Notified that Rx was sent to pharmacy.

## 2022-05-12 NOTE — Addendum Note (Signed)
Addended by: [name] on: 05/12/2022 01:32 PM   Modules accepted: Orders

## 2022-05-12 NOTE — Telephone Encounter (Signed)
  Chief Complaint: vomiting early morning Symptoms: vomiting once this morning Frequency: once today Pertinent Negatives: Patient denies fever Disposition: '[]'$ ED /'[]'$ Urgent Care (no appt availability in office) / '[]'$ Appointment(In office/virtual)/ '[]'$  [facility]/ '[x]'$ Home Care/ '[]'$ Refused Recommended Disposition /'[]'$ [facility]/ '[]'$  Follow-up with PCP Additional Notes: Patient's wife advised per home care protocol, advised ED over weekend for dehydration symptoms or fever, will call back if increased symptoms today after home care. Wife has questions about diet- should she continue soft food diet- will ask provider about diet recommendations- no notes in AVS.

## 2022-05-12 NOTE — Telephone Encounter (Signed)
I have sent a prescription for Zofran to his pharmacy.

## 2022-05-15 DIAGNOSIS — R338 Other retention of urine: Secondary | ICD-10-CM | POA: Diagnosis not present

## 2022-05-17 ENCOUNTER — Ambulatory Visit: Payer: Self-pay

## 2022-05-17 ENCOUNTER — Emergency Department (HOSPITAL_COMMUNITY): Payer: Medicare HMO

## 2022-05-17 ENCOUNTER — Inpatient Hospital Stay (HOSPITAL_COMMUNITY)
Admission: EM | Admit: 2022-05-17 | Discharge: 2022-05-19 | DRG: 194 | Disposition: A | Discharge status: Home / Self Care | Payer: Medicare HMO | Attending: Internal Medicine | Admitting: Internal Medicine

## 2022-05-17 ENCOUNTER — Inpatient Hospital Stay (HOSPITAL_COMMUNITY): Payer: Medicare HMO

## 2022-05-17 DIAGNOSIS — G40909 Epilepsy, unspecified, not intractable, without status epilepticus: Secondary | ICD-10-CM | POA: Diagnosis present

## 2022-05-17 DIAGNOSIS — Z66 Do not resuscitate: Secondary | ICD-10-CM | POA: Diagnosis not present

## 2022-05-17 DIAGNOSIS — L816 Other disorders of diminished melanin formation: Secondary | ICD-10-CM | POA: Diagnosis present

## 2022-05-17 DIAGNOSIS — F17200 Nicotine dependence, unspecified, uncomplicated: Secondary | ICD-10-CM | POA: Diagnosis not present

## 2022-05-17 DIAGNOSIS — I441 Atrioventricular block, second degree: Secondary | ICD-10-CM | POA: Diagnosis not present

## 2022-05-17 DIAGNOSIS — I82402 Acute embolism and thrombosis of unspecified deep veins of left lower extremity: Secondary | ICD-10-CM | POA: Diagnosis not present

## 2022-05-17 DIAGNOSIS — I82419 Acute embolism and thrombosis of unspecified femoral vein: Principal | ICD-10-CM

## 2022-05-17 DIAGNOSIS — H35033 Hypertensive retinopathy, bilateral: Secondary | ICD-10-CM | POA: Diagnosis not present

## 2022-05-17 DIAGNOSIS — I69391 Dysphagia following cerebral infarction: Secondary | ICD-10-CM | POA: Diagnosis not present

## 2022-05-17 DIAGNOSIS — E7800 Pure hypercholesterolemia, unspecified: Secondary | ICD-10-CM | POA: Diagnosis not present

## 2022-05-17 DIAGNOSIS — M7989 Other specified soft tissue disorders: Secondary | ICD-10-CM | POA: Diagnosis not present

## 2022-05-17 DIAGNOSIS — F17290 Nicotine dependence, other tobacco product, uncomplicated: Secondary | ICD-10-CM | POA: Diagnosis present

## 2022-05-17 DIAGNOSIS — I82431 Acute embolism and thrombosis of right popliteal vein: Secondary | ICD-10-CM | POA: Diagnosis present

## 2022-05-17 DIAGNOSIS — I700 Atherosclerosis of aorta: Secondary | ICD-10-CM | POA: Diagnosis not present

## 2022-05-17 DIAGNOSIS — U071 COVID-19: Secondary | ICD-10-CM | POA: Diagnosis not present

## 2022-05-17 DIAGNOSIS — Z936 Other artificial openings of urinary tract status: Secondary | ICD-10-CM

## 2022-05-17 DIAGNOSIS — I2609 Other pulmonary embolism with acute cor pulmonale: Secondary | ICD-10-CM

## 2022-05-17 DIAGNOSIS — R338 Other retention of urine: Secondary | ICD-10-CM | POA: Diagnosis not present

## 2022-05-17 DIAGNOSIS — Z743 Need for continuous supervision: Secondary | ICD-10-CM | POA: Diagnosis not present

## 2022-05-17 DIAGNOSIS — J159 Unspecified bacterial pneumonia: Secondary | ICD-10-CM | POA: Diagnosis not present

## 2022-05-17 DIAGNOSIS — D631 Anemia in chronic kidney disease: Secondary | ICD-10-CM | POA: Diagnosis not present

## 2022-05-17 DIAGNOSIS — R1310 Dysphagia, unspecified: Secondary | ICD-10-CM | POA: Diagnosis present

## 2022-05-17 DIAGNOSIS — I129 Hypertensive chronic kidney disease with stage 1 through stage 4 chronic kidney disease, or unspecified chronic kidney disease: Secondary | ICD-10-CM | POA: Diagnosis present

## 2022-05-17 DIAGNOSIS — R509 Fever, unspecified: Secondary | ICD-10-CM | POA: Diagnosis not present

## 2022-05-17 DIAGNOSIS — E860 Dehydration: Secondary | ICD-10-CM | POA: Diagnosis not present

## 2022-05-17 DIAGNOSIS — N401 Enlarged prostate with lower urinary tract symptoms: Secondary | ICD-10-CM | POA: Diagnosis present

## 2022-05-17 DIAGNOSIS — Z9181 History of falling: Secondary | ICD-10-CM | POA: Diagnosis not present

## 2022-05-17 DIAGNOSIS — G6181 Chronic inflammatory demyelinating polyneuritis: Secondary | ICD-10-CM | POA: Diagnosis present

## 2022-05-17 DIAGNOSIS — J189 Pneumonia, unspecified organism: Secondary | ICD-10-CM | POA: Diagnosis not present

## 2022-05-17 DIAGNOSIS — Z8249 Family history of ischemic heart disease and other diseases of the circulatory system: Secondary | ICD-10-CM

## 2022-05-17 DIAGNOSIS — N179 Acute kidney failure, unspecified: Secondary | ICD-10-CM | POA: Diagnosis present

## 2022-05-17 DIAGNOSIS — J440 Chronic obstructive pulmonary disease with acute lower respiratory infection: Secondary | ICD-10-CM | POA: Diagnosis not present

## 2022-05-17 DIAGNOSIS — G9341 Metabolic encephalopathy: Secondary | ICD-10-CM | POA: Diagnosis not present

## 2022-05-17 DIAGNOSIS — R059 Cough, unspecified: Secondary | ICD-10-CM | POA: Diagnosis not present

## 2022-05-17 DIAGNOSIS — I82451 Acute embolism and thrombosis of right peroneal vein: Secondary | ICD-10-CM | POA: Diagnosis present

## 2022-05-17 DIAGNOSIS — I82411 Acute embolism and thrombosis of right femoral vein: Secondary | ICD-10-CM | POA: Diagnosis not present

## 2022-05-17 DIAGNOSIS — G1221 Amyotrophic lateral sclerosis: Secondary | ICD-10-CM | POA: Diagnosis present

## 2022-05-17 DIAGNOSIS — Z466 Encounter for fitting and adjustment of urinary device: Secondary | ICD-10-CM | POA: Diagnosis not present

## 2022-05-17 DIAGNOSIS — I82509 Chronic embolism and thrombosis of unspecified deep veins of unspecified lower extremity: Secondary | ICD-10-CM

## 2022-05-17 DIAGNOSIS — J9811 Atelectasis: Secondary | ICD-10-CM | POA: Diagnosis not present

## 2022-05-17 DIAGNOSIS — I459 Conduction disorder, unspecified: Secondary | ICD-10-CM | POA: Diagnosis present

## 2022-05-17 DIAGNOSIS — R9431 Abnormal electrocardiogram [ECG] [EKG]: Secondary | ICD-10-CM | POA: Diagnosis not present

## 2022-05-17 DIAGNOSIS — I824Y1 Acute embolism and thrombosis of unspecified deep veins of right proximal lower extremity: Secondary | ICD-10-CM | POA: Diagnosis not present

## 2022-05-17 DIAGNOSIS — I82409 Acute embolism and thrombosis of unspecified deep veins of unspecified lower extremity: Secondary | ICD-10-CM

## 2022-05-17 DIAGNOSIS — H401134 Primary open-angle glaucoma, bilateral, indeterminate stage: Secondary | ICD-10-CM | POA: Diagnosis present

## 2022-05-17 DIAGNOSIS — R531 Weakness: Secondary | ICD-10-CM | POA: Diagnosis not present

## 2022-05-17 DIAGNOSIS — Z950 Presence of cardiac pacemaker: Secondary | ICD-10-CM

## 2022-05-17 DIAGNOSIS — Z825 Family history of asthma and other chronic lower respiratory diseases: Secondary | ICD-10-CM

## 2022-05-17 DIAGNOSIS — I82441 Acute embolism and thrombosis of right tibial vein: Secondary | ICD-10-CM | POA: Diagnosis not present

## 2022-05-17 DIAGNOSIS — N184 Chronic kidney disease, stage 4 (severe): Secondary | ICD-10-CM | POA: Diagnosis not present

## 2022-05-17 DIAGNOSIS — Z20822 Contact with and (suspected) exposure to covid-19: Secondary | ICD-10-CM | POA: Diagnosis not present

## 2022-05-17 DIAGNOSIS — K219 Gastro-esophageal reflux disease without esophagitis: Secondary | ICD-10-CM | POA: Diagnosis present

## 2022-05-17 DIAGNOSIS — M1990 Unspecified osteoarthritis, unspecified site: Secondary | ICD-10-CM | POA: Diagnosis present

## 2022-05-17 DIAGNOSIS — Z8673 Personal history of transient ischemic attack (TIA), and cerebral infarction without residual deficits: Secondary | ICD-10-CM

## 2022-05-17 DIAGNOSIS — R309 Painful micturition, unspecified: Secondary | ICD-10-CM

## 2022-05-17 DIAGNOSIS — I498 Other specified cardiac arrhythmias: Secondary | ICD-10-CM | POA: Diagnosis not present

## 2022-05-17 DIAGNOSIS — R262 Difficulty in walking, not elsewhere classified: Secondary | ICD-10-CM | POA: Diagnosis present

## 2022-05-17 LAB — URINALYSIS, ROUTINE W REFLEX MICROSCOPIC
Bilirubin Urine: NEGATIVE
Glucose, UA: NEGATIVE mg/dL
Hgb urine dipstick: NEGATIVE
Ketones, ur: NEGATIVE mg/dL
Nitrite: POSITIVE — AB
Protein, ur: 30 mg/dL — AB
Specific Gravity, Urine: 1.01 (ref 1.005–1.030)
pH: 6 (ref 5.0–8.0)

## 2022-05-17 LAB — CBC WITH DIFFERENTIAL/PLATELET
Abs Immature Granulocytes: 0.02 10*3/uL (ref 0.00–0.07)
Basophils Absolute: 0 10*3/uL (ref 0.0–0.1)
Basophils Relative: 0 %
Eosinophils Absolute: 0.5 10*3/uL (ref 0.0–0.5)
Eosinophils Relative: 5 %
HCT: 30.5 % — ABNORMAL LOW (ref 39.0–52.0)
Hemoglobin: 9.9 g/dL — ABNORMAL LOW (ref 13.0–17.0)
Immature Granulocytes: 0 %
Lymphocytes Relative: 17 %
Lymphs Abs: 1.7 10*3/uL (ref 0.7–4.0)
MCH: 29.4 pg (ref 26.0–34.0)
MCHC: 32.5 g/dL (ref 30.0–36.0)
MCV: 90.5 fL (ref 80.0–100.0)
Monocytes Absolute: 0.9 10*3/uL (ref 0.1–1.0)
Monocytes Relative: 9 %
Neutro Abs: 6.9 10*3/uL (ref 1.7–7.7)
Neutrophils Relative %: 69 %
Platelets: 206 10*3/uL (ref 150–400)
RBC: 3.37 MIL/uL — ABNORMAL LOW (ref 4.22–5.81)
RDW: 14.4 % (ref 11.5–15.5)
WBC: 10.1 10*3/uL (ref 4.0–10.5)
nRBC: 0 % (ref 0.0–0.2)

## 2022-05-17 LAB — COMPREHENSIVE METABOLIC PANEL
ALT: 16 U/L (ref 0–44)
AST: 13 U/L — ABNORMAL LOW (ref 15–41)
Albumin: 3.1 g/dL — ABNORMAL LOW (ref 3.5–5.0)
Alkaline Phosphatase: 72 U/L (ref 38–126)
Anion gap: 7 (ref 5–15)
BUN: 77 mg/dL — ABNORMAL HIGH (ref 8–23)
CO2: 24 mmol/L (ref 22–32)
Calcium: 9.5 mg/dL (ref 8.9–10.3)
Chloride: 110 mmol/L (ref 98–111)
Creatinine, Ser: 3.87 mg/dL — ABNORMAL HIGH (ref 0.61–1.24)
GFR, Estimated: 16 mL/min — ABNORMAL LOW (ref >60)
Glucose, Bld: 120 mg/dL — ABNORMAL HIGH (ref 70–99)
Potassium: 4.9 mmol/L (ref 3.5–5.1)
Sodium: 141 mmol/L (ref 135–145)
Total Bilirubin: 0.5 mg/dL (ref 0.3–1.2)
Total Protein: 6.1 g/dL — ABNORMAL LOW (ref 6.5–8.1)

## 2022-05-17 LAB — ECHOCARDIOGRAM COMPLETE
Area-P 1/2: 2.22 cm2
Height: 75 in
MV M vel: 2.24 m/s
MV Peak grad: 20.1 mmHg
S' Lateral: 3.5 cm
Weight: 2720 oz

## 2022-05-17 LAB — TROPONIN I (HIGH SENSITIVITY)
Troponin I (High Sensitivity): 15 ng/L (ref <18)
Troponin I (High Sensitivity): 18 ng/L — ABNORMAL HIGH (ref <18)

## 2022-05-17 LAB — HEPARIN LEVEL (UNFRACTIONATED): Heparin Unfractionated: 0.79 IU/mL — ABNORMAL HIGH (ref 0.30–0.70)

## 2022-05-17 LAB — SARS CORONAVIRUS 2 BY RT PCR: SARS Coronavirus 2 by RT PCR: NEGATIVE

## 2022-05-17 MED ORDER — LORATADINE 10 MG PO TABS
10.0000 mg | ORAL_TABLET | Freq: Every day | ORAL | Status: DC
  Administered 2022-05-18 – 2022-05-19 (×2): 10 mg via ORAL
  Filled 2022-05-17 (×2): qty 1

## 2022-05-17 MED ORDER — LATANOPROST 0.005 % OP SOLN
1.0000 [drp] | Freq: Every day | OPHTHALMIC | Status: DC
Start: 2022-05-18 — End: 2022-05-20
  Administered 2022-05-18: 1 [drp] via OPHTHALMIC
  Filled 2022-05-17: qty 2.5

## 2022-05-17 MED ORDER — SODIUM CHLORIDE 0.9 % IV SOLN
1.0000 g | Freq: Once | INTRAVENOUS | Status: AC
  Administered 2022-05-17: 1 g via INTRAVENOUS
  Filled 2022-05-17: qty 10

## 2022-05-17 MED ORDER — LEVETIRACETAM 500 MG PO TABS
500.0000 mg | ORAL_TABLET | Freq: Two times a day (BID) | ORAL | Status: DC
  Administered 2022-05-17 – 2022-05-19 (×4): 500 mg via ORAL
  Filled 2022-05-17 (×4): qty 1

## 2022-05-17 MED ORDER — HEPARIN (PORCINE) 25000 UT/250ML-% IV SOLN
1150.0000 [IU]/h | INTRAVENOUS | Status: DC
  Administered 2022-05-17: 1300 [IU]/h via INTRAVENOUS
  Administered 2022-05-18: 1150 [IU]/h via INTRAVENOUS
  Filled 2022-05-17 (×2): qty 250

## 2022-05-17 MED ORDER — ONDANSETRON HCL 4 MG PO TABS
4.0000 mg | ORAL_TABLET | Freq: Three times a day (TID) | ORAL | Status: DC | PRN
Start: 2022-05-17 — End: 2022-05-20

## 2022-05-17 MED ORDER — FLUTICASONE FUROATE-VILANTEROL 200-25 MCG/ACT IN AEPB
2.0000 | INHALATION_SPRAY | Freq: Every day | RESPIRATORY_TRACT | Status: DC
  Administered 2022-05-17 – 2022-05-19 (×3): 2 via RESPIRATORY_TRACT
  Filled 2022-05-17 (×2): qty 28

## 2022-05-17 MED ORDER — AZITHROMYCIN 500 MG PO TABS
500.0000 mg | ORAL_TABLET | Freq: Every day | ORAL | Status: DC
Start: 2022-05-18 — End: 2022-05-20
  Administered 2022-05-18 – 2022-05-19 (×2): 500 mg via ORAL
  Filled 2022-05-17 (×2): qty 1

## 2022-05-17 MED ORDER — METOPROLOL SUCCINATE ER 50 MG PO TB24
50.0000 mg | ORAL_TABLET | Freq: Every day | ORAL | Status: DC
  Administered 2022-05-17 – 2022-05-18 (×2): 50 mg via ORAL
  Filled 2022-05-17 (×2): qty 1

## 2022-05-17 MED ORDER — ACETAMINOPHEN 325 MG PO TABS: 325.0000 mg | ORAL_TABLET | Freq: Every day | ORAL | Status: DC | PRN

## 2022-05-17 MED ORDER — DICYCLOMINE HCL 10 MG PO CAPS: 10.0000 mg | ORAL_CAPSULE | Freq: Every day | ORAL | Status: DC | PRN

## 2022-05-17 MED ORDER — ATORVASTATIN CALCIUM 40 MG PO TABS
40.0000 mg | ORAL_TABLET | Freq: Every day | ORAL | Status: DC
  Administered 2022-05-17 – 2022-05-19 (×3): 40 mg via ORAL
  Filled 2022-05-17 (×3): qty 1

## 2022-05-17 MED ORDER — DORZOLAMIDE HCL-TIMOLOL MAL 2-0.5 % OP SOLN
1.0000 [drp] | Freq: Every day | OPHTHALMIC | Status: DC
Start: 2022-05-18 — End: 2022-05-20
  Administered 2022-05-18 – 2022-05-19 (×2): 1 [drp] via OPHTHALMIC
  Filled 2022-05-17: qty 10

## 2022-05-17 MED ORDER — FERROUS SULFATE 325 (65 FE) MG PO TABS
325.0000 mg | ORAL_TABLET | Freq: Two times a day (BID) | ORAL | Status: DC
  Administered 2022-05-17 – 2022-05-19 (×4): 325 mg via ORAL
  Filled 2022-05-17 (×4): qty 1

## 2022-05-17 MED ORDER — BRIMONIDINE TARTRATE 0.2 % OP SOLN
1.0000 [drp] | Freq: Every day | OPHTHALMIC | Status: DC
Start: 2022-05-18 — End: 2022-05-20
  Administered 2022-05-18 – 2022-05-19 (×2): 1 [drp] via OPHTHALMIC
  Filled 2022-05-17: qty 5

## 2022-05-17 MED ORDER — SODIUM CHLORIDE 0.9 % IV SOLN
INTRAVENOUS | Status: AC
Start: 2022-05-17 — End: 2022-05-18

## 2022-05-17 MED ORDER — IPRATROPIUM-ALBUTEROL 0.5-2.5 (3) MG/3ML IN SOLN
3.0000 mL | Freq: Four times a day (QID) | RESPIRATORY_TRACT | Status: DC
Start: 2022-05-17 — End: 2022-05-18
  Administered 2022-05-17 – 2022-05-18 (×3): 3 mL via RESPIRATORY_TRACT
  Filled 2022-05-17 (×3): qty 3

## 2022-05-17 MED ORDER — FLUTICASONE PROPIONATE 50 MCG/ACT NA SUSP
1.0000 | Freq: Every day | NASAL | Status: DC
  Administered 2022-05-17 – 2022-05-19 (×3): 1 via NASAL
  Filled 2022-05-17: qty 16

## 2022-05-17 MED ORDER — MONTELUKAST SODIUM 10 MG PO TABS
10.0000 mg | ORAL_TABLET | Freq: Every day | ORAL | Status: DC
  Administered 2022-05-17 – 2022-05-18 (×2): 10 mg via ORAL
  Filled 2022-05-17 (×2): qty 1

## 2022-05-17 MED ORDER — SODIUM CHLORIDE 0.9 % IV SOLN
2.0000 g | INTRAVENOUS | Status: DC
Start: 2022-05-18 — End: 2022-05-20
  Administered 2022-05-18 – 2022-05-19 (×2): 2 g via INTRAVENOUS
  Filled 2022-05-17 (×2): qty 20

## 2022-05-17 MED ORDER — SODIUM CHLORIDE 0.9 % IV SOLN
500.0000 mg | Freq: Once | INTRAVENOUS | Status: AC
  Administered 2022-05-17: 500 mg via INTRAVENOUS
  Filled 2022-05-17: qty 5

## 2022-05-17 MED ORDER — FINASTERIDE 5 MG PO TABS
5.0000 mg | ORAL_TABLET | Freq: Every day | ORAL | Status: DC
  Administered 2022-05-17 – 2022-05-19 (×3): 5 mg via ORAL
  Filled 2022-05-17 (×3): qty 1

## 2022-05-17 MED ORDER — HEPARIN BOLUS VIA INFUSION
5000.0000 [IU] | Freq: Once | INTRAVENOUS | Status: AC
  Administered 2022-05-17: 5000 [IU] via INTRAVENOUS
  Filled 2022-05-17: qty 5000

## 2022-05-17 MED ORDER — GLYCOPYRROLATE 1 MG PO TABS
1.0000 mg | ORAL_TABLET | Freq: Two times a day (BID) | ORAL | Status: DC
  Administered 2022-05-17 – 2022-05-19 (×4): 1 mg via ORAL
  Filled 2022-05-17 (×6): qty 1

## 2022-05-17 MED ORDER — AMLODIPINE BESYLATE 10 MG PO TABS
10.0000 mg | ORAL_TABLET | Freq: Every day | ORAL | Status: DC
  Administered 2022-05-17 – 2022-05-19 (×3): 10 mg via ORAL
  Filled 2022-05-17: qty 2
  Filled 2022-05-17 (×2): qty 1

## 2022-05-17 MED ORDER — POLYETHYLENE GLYCOL 3350 17 G PO PACK
17.0000 g | PACK | Freq: Every day | ORAL | Status: DC
Start: 2022-05-17 — End: 2022-05-20

## 2022-05-17 MED ORDER — ALBUTEROL SULFATE (2.5 MG/3ML) 0.083% IN NEBU: 3.0000 mL | INHALATION_SOLUTION | Freq: Four times a day (QID) | RESPIRATORY_TRACT | Status: DC | PRN

## 2022-05-17 MED ORDER — HYDRALAZINE HCL 25 MG PO TABS
25.0000 mg | ORAL_TABLET | Freq: Three times a day (TID) | ORAL | Status: DC
  Administered 2022-05-17 – 2022-05-19 (×6): 25 mg via ORAL
  Filled 2022-05-17 (×6): qty 1

## 2022-05-17 MED ORDER — TAMSULOSIN HCL 0.4 MG PO CAPS
0.4000 mg | ORAL_CAPSULE | Freq: Every day | ORAL | Status: DC
  Administered 2022-05-17 – 2022-05-19 (×3): 0.4 mg via ORAL
  Filled 2022-05-17 (×3): qty 1

## 2022-05-17 MED ORDER — GUAIFENESIN ER 600 MG PO TB12
1200.0000 mg | ORAL_TABLET | Freq: Two times a day (BID) | ORAL | Status: DC
  Administered 2022-05-17 – 2022-05-19 (×4): 1200 mg via ORAL
  Filled 2022-05-17 (×4): qty 2

## 2022-05-17 NOTE — H&P (Signed)
History and Physical    Darrell [name] TWS:[id number] DOB: [birth year] DOA: 05/17/2022  PCP: [name], MD (Confirm with patient/family/NH records and if not entered, this has to be entered at [facility] point of entry) Patient coming from: Home  I have personally briefly reviewed patient's old medical records in [place]  Chief Complaint: Cough  HPI: Darrell [name] is a 71 y.o. male with medical history significant of ALS, stroke with chronic dysphagia on pured diet, CKD stage IV, acute urinary retention on indwelling Foley catheter, BPH, HTN, HLD, seizure disorder, COPD, cigar smoker, sent by member for evaluation of cough, poor intake and new onset of right leg swelling and pain.  At baseline, patient has chronic right-sided weakness from residual CVA dysfunction, he is able to stand on himself for short period of time, but unable to walk himself.  He has been on pured diet without problem but has to be fed by family member.  Last week, patient started to develop right lower extremity swelling and pain, and last 3 days, family member observed the patient developed productive cough but " very gurgly and hard to cough out" no chest pains no fever chills.  And patient has had poor appetite and oral intake for the last 3 days and feeling "dehydrated". Last month, patient was diagnosed with acute urinary retention on last admission about 1 month ago and was placed on Foley catheter, for failed to void trial outpatient at urology and Foley was exchanged yesterday.  ED Course: Afebrile, no tachycardia no hypotension no hypoxia.  DVT study positive for acute right popliteal vein DVT.  Chest x-ray.  Left lower lobe infiltrate versus consolidation.  WBC 10.1, BUN 73 creatinine 3.8.  Review of Systems: As per HPI otherwise 14 point review of systems negative.    Past Medical History:  Diagnosis Date   Arthritis    Asthma    At high risk for falls 08/16/2015   CIDP (chronic inflammatory  demyelinating polyneuropathy) ([name])    CKD (chronic kidney disease) stage 3, GFR 30-59 ml/min ([name]) 08/16/2015   Dysphagia as late effect of cerebrovascular disease    pts wife states pt has to eat soft foods    Elevated liver enzymes 08/10/2016   GERD (gastroesophageal reflux disease)    Glaucoma    High cholesterol    History of CVA with residual deficit 03/25/2013   Hypertension    Hypertensive retinopathy of both eyes 01/16/2017   Inguinal hernia 03/25/2013   Liver hemangioma 08/14/2016   New onset seizure ([place]) 07/08/2017   seizure 07/14/18   Nuclear sclerosis of both eyes 10/25/2016   Pneumonia    Presence of permanent cardiac pacemaker    Primary open angle glaucoma of both eyes, indeterminate stage 10/25/2016   Renal mass, right 08/14/2016   Status cardiac pacemaker 01/29/2017   Placed for second degree heart block on 01/16/17 Medtronic Azure XT DR MRI SureScan dual-chamber pacemaker   Stroke [facility])    2011 with residual deficit left sided weakness   Tobacco dependence     Past Surgical History:  Procedure Laterality Date   EYE SURGERY     HERNIA REPAIR     INGUINAL HERNIA REPAIR Right 11/23/2014   Procedure: right inguinal hernia repair with mesh;  Surgeon: Armandina Gemma, MD;  Location: [facility];  Service: General;  Laterality: Right;   INSERTION OF MESH N/A 11/23/2014   Procedure: INSERTION OF MESH;  Surgeon: Armandina Gemma, MD;  Location: [facility];  Service:  General;  Laterality: N/A;   JOINT REPLACEMENT     MASS EXCISION Left 08/29/2017   Procedure: EXCISION OF LEFT NECK MASS;  Surgeon: Coralie Keens, MD;  Location: [facility];  Service: General;  Laterality: Left;   PACEMAKER IMPLANT N/A 01/16/2017   Procedure: Pacemaker Implant;  Surgeon: Will Meredith Leeds, MD;  Location: [place] [facility];  Service: Cardiovascular;  Laterality: N/A;   SHOULDER SURGERY Bilateral 1988, 1998     reports that he has been smoking cigarettes. He has a 40.00 pack-year smoking history. He has  never used smokeless tobacco. He reports that he does not drink alcohol and does not use drugs.  Allergies  Allergen Reactions   Ace Inhibitors Other (See Comments)    Hyperkalemia   Doxycycline Other (See Comments)    Hiccups, cough, nausea and emesis, elevated liver enzymes, elevated eosinophils, SOB concerning for early DRESS syndrome    [place] Hct [Candesartan Cilexetil-Hctz] Hives   Shellfish Allergy Hives    Family History  Problem Relation Age of Onset   Hypertension Mother    Diabetes Sister    Hypertension Sister    Cancer Sister        1 sister   COPD Sister        in 1 sister   Stomach cancer Neg Hx    Colon cancer Neg Hx    Pancreatic cancer Neg Hx    Esophageal cancer Neg Hx      Prior to Admission medications   Medication Sig Start Date End Date Taking? Authorizing Provider  acetaminophen (TYLENOL) 325 MG tablet Take 325 mg by mouth daily as needed for mild pain or moderate pain.   Yes [provider]  albuterol (VENTOLIN HFA) 108 (90 Base) MCG/ACT inhaler Inhale 2 puffs into the lungs every 6 (six) hours as needed for up to 30 days for Wheezing. 06/08/21  Yes Freeman Caldron M, PA-C  amLODipine (NORVASC) 10 MG tablet Take 1 tablet by mouth once daily 03/23/22  Yes Newlin, Enobong, MD  atorvastatin (LIPITOR) 40 MG tablet Take 1 tablet (40 mg total) by mouth daily. 10/27/21  Yes [name], MD  azithromycin (ZITHROMAX) 500 MG tablet Take 1 tablet (500 mg total) by mouth See admin instructions. Take 1 tablet by mouth Monday,Wednesday and Friday 03/29/22  Yes Little Ishikawa, MD  brimonidine [facility]) 0.2 % ophthalmic solution Place 1 drop into both eyes daily. 07/25/21  Yes [provider]  cetirizine (ZYRTEC) 10 MG tablet Take 1 tablet (10 mg total) by mouth daily. 01/24/22  Yes [name], MD  dicyclomine (BENTYL) 10 MG capsule Take 10 mg by mouth daily as needed (pain).   Yes [provider]  dorzolamide-timolol (COSOPT)  22.3-6.8 MG/ML ophthalmic solution Place 1 drop into both eyes daily in the afternoon.   Yes [provider]  ferrous sulfate (FEROSUL) 325 (65 FE) MG tablet Take 1 tablet (325 mg total) by mouth 2 (two) times daily with a meal. 06/08/21  Yes McClung, Angela M, PA-C  finasteride (PROSCAR) 5 MG tablet Take 5 mg by mouth daily. 12/13/21  Yes [provider]  fluticasone (FLONASE) 50 MCG/ACT nasal spray Place 1 spray into both nostrils daily.   Yes [provider]  fluticasone furoate-vilanterol (BREO ELLIPTA) 200-25 MCG/ACT AEPB Inhale 2 puffs into the lungs daily.   Yes [provider]  glycopyrrolate (ROBINUL) 1 MG tablet Take 1 tablet (1 mg total) by mouth 2 times daily. 10/27/21  Yes [name], MD  guaiFENesin (MUCINEX) 600 MG 12 hr tablet Take 600 mg by mouth 2 (two) times daily.   Yes [provider]  hydrALAZINE (APRESOLINE) 25 MG tablet Take 1 tablet (25 mg total) by mouth 3 (three) times daily. 04/25/22  Yes Shirley Friar, PA-C  latanoprost (XALATAN) 0.005 % ophthalmic solution Place 1 drop into both eyes at bedtime. 07/25/21  Yes [provider]  levETIRAcetam (KEPPRA) 500 MG tablet Take 1 tablet (500 mg total) by mouth 2 (two) times daily. 12/19/21  Yes Penumalli, Earlean Polka, MD  metoprolol succinate (TOPROL-XL) 50 MG 24 hr tablet Take 1 tablet (50 mg total) by mouth at bedtime. Take with or immediately following a meal. 04/04/22  Yes Camnitz, Will Hassell Done, MD  montelukast (SINGULAIR) 10 MG tablet Take 10 mg by mouth at bedtime. 08/29/21  Yes [provider]  Multiple Vitamin (MULTI-VITAMIN) tablet Take 1 tablet by mouth daily.   Yes [provider]  ondansetron (ZOFRAN) 4 MG tablet Take 1 tablet (4 mg total) by mouth every 8 (eight) hours as needed for nausea or vomiting. 05/12/22  Yes Newlin, Enobong, MD  polyethylene glycol (MIRALAX) 17 g packet Take 17 g by mouth daily. 10/11/21  Yes Shelly Coss, MD  tamsulosin  (FLOMAX) 0.4 MG CAPS capsule Take 1 capsule (0.4 mg total) by mouth daily. 01/24/22  Yes [name], MD  torsemide (DEMADEX) 20 MG tablet Take 20 mg by mouth daily as needed (fluid).   Yes [provider]    Physical Exam: Vitals:   05/17/22 1430 05/17/22 1445 05/17/22 1500 05/17/22 1515  BP: 135/87 137/75 139/85 133/84  Pulse: 69 62 64 63  Resp: '14 13 14 12  '$ Temp:      TempSrc:      SpO2: 99% 98% 99% 98%  Weight:      Height:        Constitutional: NAD, calm, comfortable Vitals:   05/17/22 1430 05/17/22 1445 05/17/22 1500 05/17/22 1515  BP: 135/87 137/75 139/85 133/84  Pulse: 69 62 64 63  Resp: '14 13 14 12  '$ Temp:      TempSrc:      SpO2: 99% 98% 99% 98%  Weight:      Height:       Eyes: PERRL, lids and conjunctivae normal ENMT: Mucous membranes are dry. Posterior pharynx clear of any exudate or lesions.Normal dentition.  Neck: normal, supple, no masses, no thyromegaly Respiratory: clear to auscultation bilaterally, no wheezing, coarse crackles on right lower lobe. Normal respiratory effort. No accessory muscle use.  Cardiovascular: Regular rate and rhythm, no murmurs / rubs / gallops. No extremity edema. 2+ pedal pulses. No carotid bruits.  Abdomen: no tenderness, no masses palpated. No hepatosplenomegaly. Bowel sounds positive.  Musculoskeletal: no clubbing / cyanosis. No joint deformity upper and lower extremities. Good ROM, no contractures. Normal muscle tone.  Skin: no rashes, lesions, ulcers. No induration Neurologic: CN 2-12 grossly intact. Sensation intact, DTR normal.  Chronic right-sided weakness, right leg> right arm Psychiatric: Normal judgment and insight. Alert and oriented x 3. Normal mood.     Labs on Admission: I have personally reviewed following labs and imaging studies  CBC: Recent Labs  Lab 05/17/22 1213  WBC 10.1  NEUTROABS 6.9  HGB 9.9*  HCT 30.5*  MCV 90.5  PLT 379   Basic Metabolic Panel: Recent Labs  Lab 05/17/22 1213   NA 141  K 4.9  CL 110  CO2 24  GLUCOSE 120*  BUN 77*  CREATININE 3.87*  CALCIUM 9.5   GFR: Estimated Creatinine Clearance: 19.4 mL/min (A) (by C-G formula based on SCr of 3.87 mg/dL (H)). Liver Function Tests: Recent Labs  Lab 05/17/22 1213  AST 13*  ALT 16  ALKPHOS 72  BILITOT 0.5  PROT 6.1*  ALBUMIN 3.1*   No results for input(s): "LIPASE", "AMYLASE" in the last 168 hours. No results for input(s): "AMMONIA" in the last 168 hours. Coagulation Profile: No results for input(s): "INR", "PROTIME" in the last 168 hours. Cardiac Enzymes: No results for input(s): "CKTOTAL", "CKMB", "CKMBINDEX", "TROPONINI" in the last 168 hours. BNP (last 3 results) No results for input(s): "PROBNP" in the last 8760 hours. HbA1C: No results for input(s): "HGBA1C" in the last 72 hours. CBG: No results for input(s): "GLUCAP" in the last 168 hours. Lipid Profile: No results for input(s): "CHOL", "HDL", "LDLCALC", "TRIG", "CHOLHDL", "LDLDIRECT" in the last 72 hours. Thyroid Function Tests: No results for input(s): "TSH", "T4TOTAL", "FREET4", "T3FREE", "THYROIDAB" in the last 72 hours. Anemia Panel: No results for input(s): "VITAMINB12", "FOLATE", "FERRITIN", "TIBC", "IRON", "RETICCTPCT" in the last 72 hours. Urine analysis:    Component Value Date/Time   COLORURINE YELLOW 05/17/2022 [place] 05/17/2022 1345   LABSPEC 1.010 05/17/2022 1345   PHURINE 6.0 05/17/2022 1345   GLUCOSEU NEGATIVE 05/17/2022 1345   HGBUR NEGATIVE 05/17/2022 1345   BILIRUBINUR NEGATIVE 05/17/2022 1345   BILIRUBINUR negative 09/07/2017 [place] 05/17/2022 1345   PROTEINUR 30 (A) 05/17/2022 1345   UROBILINOGEN 0.2 09/07/2017 1645   UROBILINOGEN 4.0 (H) 08/04/2016 1517   NITRITE POSITIVE (A) 05/17/2022 1345   LEUKOCYTESUR LARGE (A) 05/17/2022 1345    Radiological Exams on Admission: ECHOCARDIOGRAM COMPLETE  Result Date: 05/17/2022    ECHOCARDIOGRAM REPORT   Patient Name:    Darrell [name] Date of Exam: 05/17/2022 Medical Rec #:  [id number]     Height:       75.0 in Accession #:    [id number]    Weight:       170.0 lb Date of Birth:  1951-09-02     BSA:          2.048 m Patient Age:    83 years      BP:           135/87 mmHg Patient Gender: M             HR:           60 bpm. Exam Location:  Inpatient Procedure: 2D Echo, Cardiac Doppler and Color Doppler Indications:    Pulmonary Embolus I26.09  History:        Patient has prior history of Echocardiogram examinations, most                 recent 10/10/2012. CAD, Pacemaker, Stroke; Risk Factors:Former                 Smoker and Hypertension. CKD.  Sonographer:    Eartha Inch Referring Phys: [id number] Nelton Amsden T Kambryn Dapolito  Sonographer Comments: No subcostal window. IMPRESSIONS  1. Basal inferior, inferolateral hypokinesis. The apical window is somewhat foreshortened. The apex appears hypertrophied with prominent trabeculations. Consider cardiac MRI to evaluate further.. Left ventricular ejection fraction, by estimation, is 55 to 60%. The left ventricle has normal function. Left ventricular diastolic parameters are consistent with Grade I diastolic dysfunction (impaired relaxation). Elevated left atrial pressure.  2. Right ventricular systolic function is normal. The right ventricular size is normal. There is normal pulmonary artery  systolic pressure.  3. Right atrial size was mildly dilated.  4. Mild mitral valve regurgitation.  5. The tricuspid valve is mildly thickened.  6. The aortic valve is tricuspid. Aortic valve regurgitation is not visualized. Aortic valve sclerosis is present, with no evidence of aortic valve stenosis. FINDINGS  Left Ventricle: Basal inferior, inferolateral hypokinesis. The apical window is somewhat foreshortened. The apex appears hypertrophied with prominent trabeculations. Consider cardiac MRI to evaluate further. Left ventricular ejection fraction, by estimation, is 55 to 60%. The left ventricle has normal function. The  left ventricular internal cavity size was normal in size. Left ventricular diastolic parameters are consistent with Grade I diastolic dysfunction (impaired relaxation). Elevated left atrial pressure. Right Ventricle: The right ventricular size is normal. Right vetricular wall thickness was not assessed. Right ventricular systolic function is normal. There is normal pulmonary artery systolic pressure. The tricuspid regurgitant velocity is 2.21 m/s, and with an assumed right atrial pressure of 3 mmHg, the estimated right ventricular systolic pressure is 26.3 mmHg. Left Atrium: Left atrial size was normal in size. Right Atrium: Right atrial size was mildly dilated. Pericardium: Trivial pericardial effusion is present. Mitral Valve: There is mild thickening of the mitral valve leaflet(s). Mild mitral valve regurgitation. Tricuspid Valve: The tricuspid valve is mildly thickened. Tricuspid valve regurgitation is mild. Aortic Valve: The aortic valve is tricuspid. Aortic valve regurgitation is not visualized. Aortic valve sclerosis is present, with no evidence of aortic valve stenosis. Pulmonic Valve: The pulmonic valve was normal in structure. Pulmonic valve regurgitation is not visualized. Aorta: The aortic root and ascending aorta are structurally normal, with no evidence of dilitation. IAS/Shunts: No atrial level shunt detected by color flow Doppler. Additional Comments: A device lead is visualized.  LEFT VENTRICLE PLAX 2D LVIDd:         5.20 cm   Diastology LVIDs:         3.50 cm   LV e' medial:    2.70 cm/s LV PW:         1.10 cm   LV E/e' medial:  18.3 LV IVS:        1.00 cm   LV e' lateral:   5.40 cm/s LVOT diam:     2.30 cm   LV E/e' lateral: 9.1 LV SV:         96 LV SV Index:   47 LVOT Area:     4.15 cm  RIGHT VENTRICLE RV S prime:     12.00 cm/s TAPSE (M-mode): 1.9 cm LEFT ATRIUM             Index        RIGHT ATRIUM           Index LA diam:        4.50 cm 2.20 cm/m   RA Area:     22.60 cm LA Vol (A2C):   53.7  ml 26.22 ml/m  RA Volume:   74.00 ml  36.13 ml/m LA Vol (A4C):   28.0 ml 13.67 ml/m LA Biplane Vol: 41.4 ml 20.22 ml/m  AORTIC VALVE LVOT Vmax:   94.20 cm/s LVOT Vmean:  67.200 cm/s LVOT VTI:    0.231 m  AORTA Ao Root diam: 3.90 cm Ao Asc diam:  3.30 cm MITRAL VALVE               TRICUSPID VALVE MV Area (PHT): 2.22 cm    TR Peak grad:   19.5 mmHg MV Decel Time: 342 msec  TR Vmax:        221.00 cm/s MR Peak grad: 20.1 mmHg MR Vmax:      224.00 cm/s  SHUNTS MV E velocity: 49.30 cm/s  Systemic VTI:  0.23 m MV A velocity: 90.70 cm/s  Systemic Diam: 2.30 cm MV E/A ratio:  0.54 Dorris Carnes MD Electronically signed by Dorris Carnes MD Signature Date/Time: 05/17/2022/4:23:57 PM    Final    VAS [place] LOWER EXTREMITY VENOUS (DVT) (7a-7p)  Result Date: 05/17/2022  Lower Venous DVT Study Patient Name:  Darrell [name]  Date of Exam:   05/17/2022 Medical Rec #: [id number]      Accession #:    [id number] Date of Birth: 30-Dec-1950      Patient Gender: M Patient Age:   58 years Exam Location:  [facility] Procedure:      VAS [place] LOWER EXTREMITY VENOUS (DVT) Referring Phys: Joanette Gula --------------------------------------------------------------------------------  Indications: Swelling.  Comparison Study: No previous exam noted. Performing Technologist: Bobetta Lime BS, RVT  Examination Guidelines: A complete evaluation includes B-mode imaging, spectral Doppler, color Doppler, and power Doppler as needed of all accessible portions of each vessel. Bilateral testing is considered an integral part of a complete examination. Limited examinations for reoccurring indications may be performed as noted. The reflux portion of the exam is performed with the patient in reverse Trendelenburg.  +---------+---------------+---------+-----------+---------------+--------------+ RIGHT    CompressibilityPhasicitySpontaneityProperties     Thrombus Aging +---------+---------------+---------+-----------+---------------+--------------+  CFV      None           Yes      Yes                       Acute          +---------+---------------+---------+-----------+---------------+--------------+ SFJ      Full                                                             +---------+---------------+---------+-----------+---------------+--------------+ FV Prox  None                                                             +---------+---------------+---------+-----------+---------------+--------------+ FV Mid   None                                                             +---------+---------------+---------+-----------+---------------+--------------+ FV DistalNone                                                             +---------+---------------+---------+-----------+---------------+--------------+ PFV      None                                                             +---------+---------------+---------+-----------+---------------+--------------+  POP      None           Yes      Yes        partially                                                                 re-cannalized                 +---------+---------------+---------+-----------+---------------+--------------+ PTV      None                                                             +---------+---------------+---------+-----------+---------------+--------------+ PERO     None                                                             +---------+---------------+---------+-----------+---------------+--------------+ EIV      Full           Yes      Yes                                      +---------+---------------+---------+-----------+---------------+--------------+ The proximal common femoral vein is compressible with flow noted. Acute DVT of the mid and distal segments throughout the right lower extremity.  +---------+---------------+---------+-----------+----------+--------------+ LEFT      CompressibilityPhasicitySpontaneityPropertiesThrombus Aging +---------+---------------+---------+-----------+----------+--------------+ CFV      Full           Yes      Yes                                 +---------+---------------+---------+-----------+----------+--------------+ SFJ      Full                                                        +---------+---------------+---------+-----------+----------+--------------+ FV Prox  Full                                                        +---------+---------------+---------+-----------+----------+--------------+ FV Mid   Full                                                        +---------+---------------+---------+-----------+----------+--------------+ FV DistalFull                                                        +---------+---------------+---------+-----------+----------+--------------+  PFV      Full                                                        +---------+---------------+---------+-----------+----------+--------------+ POP      Full           Yes      Yes                                 +---------+---------------+---------+-----------+----------+--------------+ PTV      Full                                                        +---------+---------------+---------+-----------+----------+--------------+ PERO     Full                                                        +---------+---------------+---------+-----------+----------+--------------+ Wall thickening and diminutive vein size noted of the left lower extremity.   Summary: BILATERAL: -No evidence of popliteal cyst, bilaterally. RIGHT: - Findings consistent with acute deep vein thrombosis involving the right common femoral vein, right femoral vein, right proximal profunda vein, right popliteal vein, right posterior tibial veins, and right peroneal veins.  LEFT: - No evidence of deep vein thrombosis in the lower  extremity. No indirect evidence of obstruction proximal to the inguinal ligament. - Wall thickening and diminutive vein size noted on the left.  *See table(s) above for measurements and observations.    Preliminary    DG Chest Port 1 View  Result Date: 05/17/2022 CLINICAL DATA:  Cough and fever over the last 2 days. EXAM: PORTABLE CHEST 1 VIEW COMPARISON:  10/13/2021 FINDINGS: Left lateral chest not included. Chronic cardiomegaly and aortic atherosclerosis. Pacemaker leads in the region of the right atrium and right ventricle. Right lung is clear. Left upper lobe is clear. Question left lower lobe pneumonia. Two-view chest radiography recommended when able. IMPRESSION: Question left lower lobe pneumonia. Recommend two-view chest radiography when able. Electronically Signed   By: Darrell [name] M.D.   On: 05/17/2022 12:34    EKG: Independently reviewed.  Paced rhythm  Assessment/Plan Principal Problem:   PNA (pneumonia) Active Problems:   DVT (deep venous thrombosis) ([name])  (please populate well all problems here in Problem List. (For example, if patient is on BP meds at home and you resume or decide to hold them, it is a problem that needs to be her. Same for CAD, COPD, HLD and so on)  Acute DVT -Unprovoked, lifelong systemic anticoagulation indicated. -On heparin drip, waiting for echo study, expect switch to p.o. anticoagulation tomorrow if H&H stable.  No history of GI bleed as per family.  Left lower lobe pneumonia versus PE/pulmonary infarct -He has a new productive cough fevers pneumonia more than PE.  In addition, no active chest pains. -Treat pneumonia, continue ceftriaxone and azithromycin -As per family, no history of aspiration pneumonia. -Check VQ scan and echo.  If no significant  right heart strain, expect patient can be switched to p.o. anticoagulation tomorrow.  AKI on CKD stage IV -Clinically appears to be volume contracted and dehydrated, hold off diuresis.  Start mild gentle  hydration.  Chronic dysphagia -Continue pured diet  Chronic urinary retention on indwelling Foley catheter -Catheter exchanged yesterday -UA pending  HTN -Stable, continue amlodipine  COPD -No symptoms signs of acute exacerbation, continue as needed breathing meds.  ALS -No clear symptoms signs of progression.  Cigarette smoke -Cessation education performed  DVT prophylaxis: Heparin drip Code Status: DNR Family Communication: Wife at bedside Disposition Plan: Patient sick with DVT, AKI, and pneumonia, and questionable PE, requires inpatient treatment including antibiotics and heparin, expect more than 2 midnight hospital stay Consults called: None Admission status: Telemetry admission   Lequita Halt MD [facility] Pager [phone]  05/17/2022, 5:00 PM

## 2022-05-17 NOTE — Progress Notes (Signed)
ANTICOAGULATION CONSULT NOTE - Initial Consult  Pharmacy Consult for Heparin Indication: DVT  Allergies  Allergen Reactions   Ace Inhibitors Other (See Comments)    Hyperkalemia   Doxycycline Other (See Comments)    Hiccups, cough, nausea and emesis, elevated liver enzymes, elevated eosinophils, SOB concerning for early DRESS syndrome    Atacand Hct [Candesartan Cilexetil-Hctz] Hives   Shellfish Allergy Hives    Patient Measurements:   Heparin Dosing Weight: 77 kg  Vital Signs: Temp: 97.7 F (36.5 C) (08/23 1145) Temp Source: Rectal (08/23 1145) BP: 148/77 (08/23 1245) Pulse Rate: 60 (08/23 1245)  Labs: Recent Labs    05/17/22 1213  HGB 9.9*  HCT 30.5*  PLT 206  CREATININE 3.87*    CrCl cannot be calculated (Unknown ideal weight.).   Medical History: Past Medical History:  Diagnosis Date   Arthritis    Asthma    At high risk for falls 08/16/2015   CIDP (chronic inflammatory demyelinating polyneuropathy) ([name])    CKD (chronic kidney disease) stage 3, GFR 30-59 ml/min ([name]) 08/16/2015   Dysphagia as late effect of cerebrovascular disease    pts wife states pt has to eat soft foods    Elevated liver enzymes 08/10/2016   GERD (gastroesophageal reflux disease)    Glaucoma    High cholesterol    History of CVA with residual deficit 03/25/2013   Hypertension    Hypertensive retinopathy of both eyes 01/16/2017   Inguinal hernia 03/25/2013   Liver hemangioma 08/14/2016   New onset seizure ([place]) 07/08/2017   seizure 07/14/18   Nuclear sclerosis of both eyes 10/25/2016   Pneumonia    Presence of permanent cardiac pacemaker    Primary open angle glaucoma of both eyes, indeterminate stage 10/25/2016   Renal mass, right 08/14/2016   Status cardiac pacemaker 01/29/2017   Placed for second degree heart block on 01/16/17 Med[employer]onic Azure XT DR MRI SureScan dual-chamber pacemaker   Stroke [facility])    2011 with residual deficit left sided weakness   Tobacco dependence      Medications:  (Not in a hospital admission)  Scheduled:  Infusions:   azithromycin     cefTRIAXone (ROCEPHIN)  IV     PRN:   Assessment: 89 yom with a history of COPD, seizure, AMS, CKD, heart block w/ pacemaker. Patient is presenting with cough and cloudy urine as well as rt thigh pain. Heparin per pharmacy consult placed for DVT.  [place] c/w acute DVT Patient is not on anticoagulation prior to arrival.  Hgb 9.9; plt 206  Goal of Therapy:  Heparin level 0.3-0.7 units/ml Monitor platelets by anticoagulation protocol: Yes   Plan:  Give IV heparin 5000 units bolus x 1 Start heparin infusion at 1300 units/hr Check anti-Xa level in 8 hours and daily while on heparin Continue to monitor H&H and platelets  Lorelei Pont, PharmD, BCPS 05/17/2022 1:41 PM ED Clinical Pharmacist -  [phone]

## 2022-05-17 NOTE — ED Notes (Signed)
This paramedic attempted to get blood work at MD request. Patient refused until daughter at bedside.

## 2022-05-17 NOTE — ED Notes (Signed)
All meds given with apple sauce, or crushed in apple sauce, and followed by honey thick apple juice.

## 2022-05-17 NOTE — ED Triage Notes (Signed)
Pt from home BIB EMS d/t cough and fever that has been going on for the past 2 days. Pt has chronic foley in which was replaced yesterday;but cloudiness is noted in the catheter. A&O X4. VSS.

## 2022-05-17 NOTE — ED Notes (Signed)
US at bedside

## 2022-05-17 NOTE — ED Provider Notes (Signed)
[place] EMERGENCY DEPARTMENT Provider Note   CSN: [id number] Arrival date & time: 05/17/22  1129     History  Chief Complaint  Patient presents with   Fever   Cough    Darrell [name] is a 71 y.o. male with COPD, seizure disorder, AMS, CKD stage III, heart block with residing pacemaker presenting for cough and cloudy urine. Cough started two days ago. It is non-productive but constant throughout the day. Endorses fever at home. Wife states the highest temp was 99.2. Wife also mentions that he is more "sleepy" than usual. States that in July he was admitted for pneumonia and treated with antibiotics. Also mentioned, that urine has been cloudy and that patient has been complaining of painful urination. Indwelling foley catheter had been in place for "about a month" before it was changed yesterday. Also mentioned right thigh pain. Wife noticed a "bump on his inner thigh". She said that the bump "comes and goes" but denies swelling, erythema, or warmth.    Fever Associated symptoms: cough   Cough Associated symptoms: fever        Home Medications Prior to Admission medications   Medication Sig Start Date End Date Taking? Authorizing Provider  acetaminophen (TYLENOL) 325 MG tablet Take 325 mg by mouth daily as needed for mild pain or moderate pain.   Yes [provider]  albuterol (VENTOLIN HFA) 108 (90 Base) MCG/ACT inhaler Inhale 2 puffs into the lungs every 6 (six) hours as needed for up to 30 days for Wheezing. 06/08/21  Yes Freeman Caldron M, PA-C  amLODipine (NORVASC) 10 MG tablet Take 1 tablet by mouth once daily 03/23/22  Yes Newlin, Enobong, MD  atorvastatin (LIPITOR) 40 MG tablet Take 1 tablet (40 mg total) by mouth daily. 10/27/21  Yes [name], MD  azithromycin (ZITHROMAX) 500 MG tablet Take 1 tablet (500 mg total) by mouth See admin instructions. Take 1 tablet by mouth Monday,Wednesday and Friday 03/29/22  Yes Little Ishikawa, MD   brimonidine [facility]) 0.2 % ophthalmic solution Place 1 drop into both eyes daily. 07/25/21  Yes [provider]  cetirizine (ZYRTEC) 10 MG tablet Take 1 tablet (10 mg total) by mouth daily. 01/24/22  Yes [name], MD  dicyclomine (BENTYL) 10 MG capsule Take 10 mg by mouth daily as needed (pain).   Yes [provider]  dorzolamide-timolol (COSOPT) 22.3-6.8 MG/ML ophthalmic solution Place 1 drop into both eyes daily in the afternoon.   Yes [provider]  ferrous sulfate (FEROSUL) 325 (65 FE) MG tablet Take 1 tablet (325 mg total) by mouth 2 (two) times daily with a meal. 06/08/21  Yes McClung, Angela M, PA-C  finasteride (PROSCAR) 5 MG tablet Take 5 mg by mouth daily. 12/13/21  Yes [provider]  fluticasone (FLONASE) 50 MCG/ACT nasal spray Place 1 spray into both nostrils daily.   Yes [provider]  fluticasone furoate-vilanterol (BREO ELLIPTA) 200-25 MCG/ACT AEPB Inhale 2 puffs into the lungs daily.   Yes [provider]  glycopyrrolate (ROBINUL) 1 MG tablet Take 1 tablet (1 mg total) by mouth 2 times daily. 10/27/21  Yes [name], MD  guaiFENesin (MUCINEX) 600 MG 12 hr tablet Take 600 mg by mouth 2 (two) times daily.   Yes [provider]  hydrALAZINE (APRESOLINE) 25 MG tablet Take 1 tablet (25 mg total) by mouth 3 (three) times daily. 04/25/22  Yes Shirley Friar, PA-C  latanoprost (XALATAN) 0.005 % ophthalmic solution Place 1 drop  into both eyes at bedtime. 07/25/21  Yes [provider]  levETIRAcetam (KEPPRA) 500 MG tablet Take 1 tablet (500 mg total) by mouth 2 (two) times daily. 12/19/21  Yes Penumalli, Earlean Polka, MD  metoprolol succinate (TOPROL-XL) 50 MG 24 hr tablet Take 1 tablet (50 mg total) by mouth at bedtime. Take with or immediately following a meal. 04/04/22  Yes Camnitz, Will Hassell Done, MD  montelukast (SINGULAIR) 10 MG tablet Take 10 mg by mouth at bedtime. 08/29/21  Yes [provider]   Multiple Vitamin (MULTI-VITAMIN) tablet Take 1 tablet by mouth daily.   Yes [provider]  ondansetron (ZOFRAN) 4 MG tablet Take 1 tablet (4 mg total) by mouth every 8 (eight) hours as needed for nausea or vomiting. 05/12/22  Yes Newlin, Enobong, MD  polyethylene glycol (MIRALAX) 17 g packet Take 17 g by mouth daily. 10/11/21  Yes Shelly Coss, MD  tamsulosin (FLOMAX) 0.4 MG CAPS capsule Take 1 capsule (0.4 mg total) by mouth daily. 01/24/22  Yes [name], MD  torsemide (DEMADEX) 20 MG tablet Take 20 mg by mouth daily as needed (fluid).   Yes [provider]      Allergies    Ace inhibitors, Doxycycline, Atacand hct [candesartan cilexetil-hctz], and Shellfish allergy    Review of Systems   Review of Systems  Constitutional:  Positive for fever.  Respiratory:  Positive for cough.   Musculoskeletal:        Right leg pain    Physical Exam Updated Vital Signs BP 133/84   Pulse 63   Temp 97.7 F (36.5 C) (Rectal)   Resp 12   Ht '6\' 3"'$  (1.905 m)   Wt 77.1 kg   SpO2 98%   BMI 21.25 kg/m  Physical Exam Vitals and nursing note reviewed.  Constitutional:      General: He is awake.     Appearance: He is not ill-appearing or toxic-appearing.  HENT:     Head: Normocephalic and atraumatic.     Mouth/Throat:     Mouth: Mucous membranes are moist.  Eyes:     General:        Right eye: No discharge.        Left eye: No discharge.     Conjunctiva/sclera: Conjunctivae normal.  Cardiovascular:     Rate and Rhythm: Normal rate and regular rhythm.     Pulses: Normal pulses.     Heart sounds: Normal heart sounds.  Pulmonary:     Effort: Pulmonary effort is normal.     Breath sounds: Normal breath sounds.  Abdominal:     General: Abdomen is flat.     Palpations: Abdomen is soft.  Musculoskeletal:     Comments: Area of induration about the right inner thigh, approximately 3 cm wide 6 cm long. Not swollen, erythematous, or warm.   Slow to move all  extremities. Left hand constricted.   Skin:    General: Skin is warm and dry.  Neurological:     General: No focal deficit present.     Mental Status: He is oriented to person, place, and time and easily aroused. He is lethargic.  Psychiatric:        Mood and Affect: Mood normal.     ED Results / Procedures / Treatments   Labs (all labs ordered are listed, but only abnormal results are displayed) Labs Reviewed  URINALYSIS, ROUTINE W REFLEX MICROSCOPIC - Abnormal; Notable for the following components:      Result Value  Protein, ur 30 (*)    Nitrite POSITIVE (*)    Leukocytes,Ua LARGE (*)    Bacteria, UA RARE (*)    All other components within normal limits  COMPREHENSIVE METABOLIC PANEL - Abnormal; Notable for the following components:   Glucose, Bld 120 (*)    BUN 77 (*)    Creatinine, Ser 3.87 (*)    Total Protein 6.1 (*)    Albumin 3.1 (*)    AST 13 (*)    GFR, Estimated 16 (*)    All other components within normal limits  CBC WITH DIFFERENTIAL/PLATELET - Abnormal; Notable for the following components:   RBC 3.37 (*)    Hemoglobin 9.9 (*)    HCT 30.5 (*)    All other components within normal limits  SARS CORONAVIRUS 2 BY RT PCR  CULTURE, BLOOD (ROUTINE X 2)  CULTURE, BLOOD (ROUTINE X 2)  PROTIME-INR  HEPARIN LEVEL (UNFRACTIONATED)  TROPONIN I (HIGH SENSITIVITY)  TROPONIN I (HIGH SENSITIVITY)    EKG None  Radiology VAS [place] LOWER EXTREMITY VENOUS (DVT) (7a-7p)  Result Date: 05/17/2022  Lower Venous DVT Study Patient Name:  Darrell [name]  Date of Exam:   05/17/2022 Medical Rec #: [id number]      Accession #:    [id number] Date of Birth: Jan 14, 1951      Patient Gender: M Patient Age:   52 years Exam Location:  [facility] Procedure:      VAS [place] LOWER EXTREMITY VENOUS (DVT) Referring Phys: Joanette Gula --------------------------------------------------------------------------------  Indications: Swelling.  Comparison Study: No previous exam noted. Performing  Technologist: Bobetta Lime BS, RVT  Examination Guidelines: A complete evaluation includes B-mode imaging, spectral Doppler, color Doppler, and power Doppler as needed of all accessible portions of each vessel. Bilateral testing is considered an integral part of a complete examination. Limited examinations for reoccurring indications may be performed as noted. The reflux portion of the exam is performed with the patient in reverse Trendelenburg.  +---------+---------------+---------+-----------+---------------+--------------+ RIGHT    CompressibilityPhasicitySpontaneityProperties     Thrombus Aging +---------+---------------+---------+-----------+---------------+--------------+ CFV      None           Yes      Yes                       Acute          +---------+---------------+---------+-----------+---------------+--------------+ SFJ      Full                                                             +---------+---------------+---------+-----------+---------------+--------------+ FV Prox  None                                                             +---------+---------------+---------+-----------+---------------+--------------+ FV Mid   None                                                             +---------+---------------+---------+-----------+---------------+--------------+  FV DistalNone                                                             +---------+---------------+---------+-----------+---------------+--------------+ PFV      None                                                             +---------+---------------+---------+-----------+---------------+--------------+ POP      None           Yes      Yes        partially                                                                 re-cannalized                 +---------+---------------+---------+-----------+---------------+--------------+ PTV      None                                                              +---------+---------------+---------+-----------+---------------+--------------+ PERO     None                                                             +---------+---------------+---------+-----------+---------------+--------------+ EIV      Full           Yes      Yes                                      +---------+---------------+---------+-----------+---------------+--------------+ The proximal common femoral vein is compressible with flow noted. Acute DVT of the mid and distal segments throughout the right lower extremity.  +---------+---------------+---------+-----------+----------+--------------+ LEFT     CompressibilityPhasicitySpontaneityPropertiesThrombus Aging +---------+---------------+---------+-----------+----------+--------------+ CFV      Full           Yes      Yes                                 +---------+---------------+---------+-----------+----------+--------------+ SFJ      Full                                                        +---------+---------------+---------+-----------+----------+--------------+ FV Prox  Full                                                        +---------+---------------+---------+-----------+----------+--------------+  FV Mid   Full                                                        +---------+---------------+---------+-----------+----------+--------------+ FV DistalFull                                                        +---------+---------------+---------+-----------+----------+--------------+ PFV      Full                                                        +---------+---------------+---------+-----------+----------+--------------+ POP      Full           Yes      Yes                                 +---------+---------------+---------+-----------+----------+--------------+ PTV      Full                                                         +---------+---------------+---------+-----------+----------+--------------+ PERO     Full                                                        +---------+---------------+---------+-----------+----------+--------------+ Wall thickening and diminutive vein size noted of the left lower extremity.   Summary: BILATERAL: -No evidence of popliteal cyst, bilaterally. RIGHT: - Findings consistent with acute deep vein thrombosis involving the right common femoral vein, right femoral vein, right proximal profunda vein, right popliteal vein, right posterior tibial veins, and right peroneal veins.  LEFT: - No evidence of deep vein thrombosis in the lower extremity. No indirect evidence of obstruction proximal to the inguinal ligament. - Wall thickening and diminutive vein size noted on the left.  *See table(s) above for measurements and observations.    Preliminary    DG Chest Port 1 View  Result Date: 05/17/2022 CLINICAL DATA:  Cough and fever over the last 2 days. EXAM: PORTABLE CHEST 1 VIEW COMPARISON:  10/13/2021 FINDINGS: Left lateral chest not included. Chronic cardiomegaly and aortic atherosclerosis. Pacemaker leads in the region of the right atrium and right ventricle. Right lung is clear. Left upper lobe is clear. Question left lower lobe pneumonia. Two-view chest radiography recommended when able. IMPRESSION: Question left lower lobe pneumonia. Recommend two-view chest radiography when able. Electronically Signed   By: Nelson Chimes M.D.   On: 05/17/2022 12:34    Procedures Procedures    Medications Ordered in ED Medications  heparin ADULT infusion 100 units/mL (25000 units/271m) (1,300 Units/hr Intravenous New Bag/Given 05/17/22 1421)  cefTRIAXone (ROCEPHIN) 2 g in  sodium chloride 0.9 % 100 mL IVPB (has no administration in time range)  azithromycin (ZITHROMAX) tablet 500 mg (has no administration in time range)  0.9 %  sodium chloride infusion (has no administration in time range)   guaiFENesin (MUCINEX) 12 hr tablet 1,200 mg (has no administration in time range)  ipratropium-albuterol (DUONEB) 0.5-2.5 (3) MG/3ML nebulizer solution 3 mL (has no administration in time range)  acetaminophen (TYLENOL) tablet 325 mg (has no administration in time range)  amLODipine (NORVASC) tablet 10 mg (has no administration in time range)  atorvastatin (LIPITOR) tablet 40 mg (has no administration in time range)  hydrALAZINE (APRESOLINE) tablet 25 mg (has no administration in time range)  metoprolol succinate (TOPROL-XL) 24 hr tablet 50 mg (has no administration in time range)  dicyclomine (BENTYL) capsule 10 mg (has no administration in time range)  glycopyrrolate (ROBINUL) tablet 1 mg (has no administration in time range)  ondansetron (ZOFRAN) tablet 4 mg (has no administration in time range)  polyethylene glycol (MIRALAX / GLYCOLAX) packet 17 g (has no administration in time range)  finasteride (PROSCAR) tablet 5 mg (has no administration in time range)  tamsulosin (FLOMAX) capsule 0.4 mg (has no administration in time range)  ferrous sulfate tablet 325 mg (has no administration in time range)  levETIRAcetam (KEPPRA) tablet 500 mg (has no administration in time range)  albuterol (VENTOLIN HFA) 108 (90 Base) MCG/ACT inhaler 1 puff (has no administration in time range)  loratadine (CLARITIN) tablet 10 mg (has no administration in time range)  fluticasone (FLONASE) 50 MCG/ACT nasal spray 1 spray (has no administration in time range)  fluticasone furoate-vilanterol (BREO ELLIPTA) 200-25 MCG/ACT 2 puff (has no administration in time range)  montelukast (SINGULAIR) tablet 10 mg (has no administration in time range)  brimonidine (ALPHAGAN) 0.2 % ophthalmic solution 1 drop (has no administration in time range)  dorzolamide-timolol (COSOPT) 22.3-6.8 MG/ML ophthalmic solution 1 drop (has no administration in time range)  latanoprost (XALATAN) 0.005 % ophthalmic solution 1 drop (has no  administration in time range)  cefTRIAXone (ROCEPHIN) 1 g in sodium chloride 0.9 % 100 mL IVPB (0 g Intravenous Stopped 05/17/22 1424)  azithromycin (ZITHROMAX) 500 mg in sodium chloride 0.9 % 250 mL IVPB (500 mg Intravenous New Bag/Given 05/17/22 1429)  heparin bolus via infusion 5,000 Units (5,000 Units Intravenous Bolus from Bag 05/17/22 1421)    ED Course/ Medical Decision Making/ A&P Clinical Course as of 05/17/22 1538  Wed May 17, 2022  1253 Hemoglobin(!): 9.9 [JR]    Clinical Course User Index [JR] Harriet Pho, PA-C                           Medical Decision Making Amount and/or Complexity of Data Reviewed Labs: ordered. Radiology: ordered.    This patient presents to the ED for concern of leg pain, cough, and cloudy urine, this involves a number of treatment options, and is a complaint that carries with it a high risk of complications and morbidity.  The differential diagnosis includes DVT, PE, pneumonia, ACS, CAUTI, and sepsis.   Co morbidities: Discussed in HPI     EMR reviewed including pt PMHx, past surgical history and past visits to ER. Chart review for medical history. Also labs revealed h/o anemia.   See HPI for more details   Lab Tests:   I ordered and independently interpreted labs. Labs notable for hemoglobinemia   Imaging Studies:  Abnormal findings. I personally reviewed all imaging studies.  Imaging notable for large DVT in right leg.     Cardiac Monitoring:  The patient was maintained on a cardiac monitor.  I personally viewed and interpreted the cardiac monitored which showed an underlying rhythm of: NSR  EKG non-ischemic    Medicines ordered:  I ordered medication including rocephin and doxy for empiric treatment for pneumonia and UTI. Heparin for large DVT.  Reevaluation of the patient after these medicines showed that the patient stayed the same I have reviewed the patients home medicines and have made adjustments as  needed   Consults/Attending Physician   I requested consultation with Dr. Roosevelt Locks,  and discussed lab and imaging findings as well as pertinent plan - they recommend: admission for continued evalution and management.   Reevaluation:  After the interventions noted above I re-evaluated patient and found that they have :stayed the same   Problem List / ED Course:  Patient presented with leg pain, cough, and concern for UTI.   Leg pain: U/S revealed large DVT in right leg, started heparin Cough: empiric antibiotics for pneumonia UTI: empiric antibiotics for UTI   Dispostion:  After consideration of the diagnostic results and the patients response to treatment, I feel that the patent would benefit from admission to the hospital for ongoing management and evaluation.          Final Clinical Impression(s) / ED Diagnoses Final diagnoses:  Deep vein thrombosis (DVT) of femoral vein, unspecified chronicity, unspecified laterality ([name])  Cough, unspecified type  Painful urination    Rx / DC Orders ED Discharge Orders     None         Harriet Pho, PA-C 05/17/22 1538    Davonna Belling, MD 05/18/22 0830

## 2022-05-17 NOTE — Progress Notes (Signed)
Bilateral LE venous duplex study completed. Preliminary results given to [facility]. Please see CV Proc for preliminary results.  Anderson Malta  Nirvaan Frett BS, RVT 05/17/2022 12:59 PM

## 2022-05-17 NOTE — Telephone Encounter (Signed)
  Chief Complaint: Right thigh swelling and pain, mild cough Symptoms: ibid Frequency: 1 week Pertinent Negatives: Patient denies SOB Disposition: '[x]'$ ED /'[]'$ Urgent Care (no appt availability in office) / '[]'$ Appointment(In office/virtual)/ '[]'$  [facility]/ '[]'$ Home Care/ '[]'$ Refused Recommended Disposition /'[]'$ [facility]/ '[]'$  Follow-up with PCP Additional Notes: Spoke with pt's wide. Pt has had right thigh swelling, warmth and pain for about 1 week. Pt also has a mild cough for the same amount of time. Pt is unable to stand. Wife will call EMS for transport to ED.    Reason for Disposition  [1] Thigh or calf pain AND [2] only 1 side AND [3] present > 1 hour  Answer Assessment - Initial Assessment Questions 1. ONSET: "When did the swelling start?" (e.g., minutes, hours, days)     1 week or more 2. LOCATION: "What part of the leg is swollen?"  "Are both legs swollen or just one leg?"     Right leg - thigh -  3. SEVERITY: "How bad is the swelling?" (e.g., localized; mild, moderate, severe)   - Localized: Small area of swelling localized to one leg.   - MILD pedal edema: Swelling limited to foot and ankle, pitting edema < 1/4 inch (6 mm) deep, rest and elevation eliminate most or all swelling.   - MODERATE edema: Swelling of lower leg to knee, pitting edema > 1/4 inch (6 mm) deep, rest and elevation only partially reduce swelling.   - SEVERE edema: Swelling extends above knee, facial or hand swelling present.      moderate 4. REDNESS: "Does the swelling look red or infected?"     Hot - not red 5. PAIN: "Is the swelling painful to touch?" If Yes, ask: "How painful is it?"   (Scale 1-10; mild, moderate or severe)     10/10 6. FEVER: "Do you have a fever?" If Yes, ask: "What is it, how was it measured, and when did it start?"      Low grade 7. CAUSE: "What do you think is causing the leg swelling?"     unsure 8. MEDICAL HISTORY: "Do you have a history of blood clots (e.g., DVT),  cancer, heart failure, kidney disease, or liver failure?"     no 9. RECURRENT SYMPTOM: "Have you had leg swelling before?" If Yes, ask: "When was the last time?" "What happened that time?"     unsure 10. OTHER SYMPTOMS: "Do you have any other symptoms?" (e.g., chest pain, difficulty breathing)       Cough 11. PREGNANCY: "Is there any chance you are pregnant?" "When was your last menstrual period?"       na  Protocols used: Leg Swelling and Edema-A-AH

## 2022-05-18 ENCOUNTER — Telehealth (HOSPITAL_COMMUNITY): Payer: Self-pay

## 2022-05-18 ENCOUNTER — Other Ambulatory Visit (HOSPITAL_COMMUNITY): Payer: Self-pay

## 2022-05-18 ENCOUNTER — Inpatient Hospital Stay (HOSPITAL_COMMUNITY): Payer: Medicare HMO

## 2022-05-18 ENCOUNTER — Ambulatory Visit: Payer: Medicare HMO | Admitting: Family Medicine

## 2022-05-18 DIAGNOSIS — I82402 Acute embolism and thrombosis of unspecified deep veins of left lower extremity: Secondary | ICD-10-CM

## 2022-05-18 DIAGNOSIS — I82419 Acute embolism and thrombosis of unspecified femoral vein: Secondary | ICD-10-CM | POA: Diagnosis not present

## 2022-05-18 LAB — BLOOD CULTURE ID PANEL (REFLEXED) - BCID2

## 2022-05-18 LAB — BASIC METABOLIC PANEL
Anion gap: 6 (ref 5–15)
BUN: 74 mg/dL — ABNORMAL HIGH (ref 8–23)
CO2: 20 mmol/L — ABNORMAL LOW (ref 22–32)
Calcium: 8.8 mg/dL — ABNORMAL LOW (ref 8.9–10.3)
Chloride: 113 mmol/L — ABNORMAL HIGH (ref 98–111)
Creatinine, Ser: 3.35 mg/dL — ABNORMAL HIGH (ref 0.61–1.24)
GFR, Estimated: 19 mL/min — ABNORMAL LOW (ref >60)
Glucose, Bld: 113 mg/dL — ABNORMAL HIGH (ref 70–99)
Potassium: 4.7 mmol/L (ref 3.5–5.1)
Sodium: 139 mmol/L (ref 135–145)

## 2022-05-18 LAB — PROTIME-INR
INR: 1.2 (ref 0.8–1.2)
Prothrombin Time: 14.9 seconds (ref 11.4–15.2)

## 2022-05-18 LAB — CBC
HCT: 27.1 % — ABNORMAL LOW (ref 39.0–52.0)
Hemoglobin: 8.8 g/dL — ABNORMAL LOW (ref 13.0–17.0)
MCH: 29.4 pg (ref 26.0–34.0)
MCHC: 32.5 g/dL (ref 30.0–36.0)
MCV: 90.6 fL (ref 80.0–100.0)
Platelets: 198 10*3/uL (ref 150–400)
RBC: 2.99 MIL/uL — ABNORMAL LOW (ref 4.22–5.81)
RDW: 14.3 % (ref 11.5–15.5)
WBC: 10.7 10*3/uL — ABNORMAL HIGH (ref 4.0–10.5)
nRBC: 0 % (ref 0.0–0.2)

## 2022-05-18 LAB — HEPARIN LEVEL (UNFRACTIONATED)
Heparin Unfractionated: 0.73 IU/mL — ABNORMAL HIGH (ref 0.30–0.70)
Heparin Unfractionated: 0.83 IU/mL — ABNORMAL HIGH (ref 0.30–0.70)
Heparin Unfractionated: 0.83 IU/mL — ABNORMAL HIGH (ref 0.30–0.70)

## 2022-05-18 MED ORDER — APIXABAN 5 MG PO TABS: 5.0000 mg | ORAL_TABLET | Freq: Two times a day (BID) | ORAL | Status: DC

## 2022-05-18 MED ORDER — FOOD THICKENER (SIMPLYTHICK HONEY)
20.0000 | ORAL | Status: DC | PRN
Start: 2022-05-18 — End: 2022-05-20

## 2022-05-18 MED ORDER — TECHNETIUM TO 99M ALBUMIN AGGREGATED
4.0000 | Freq: Once | INTRAVENOUS | Status: AC | PRN
  Administered 2022-05-18: 4 via INTRAVENOUS

## 2022-05-18 MED ORDER — CHLORHEXIDINE GLUCONATE CLOTH 2 % EX PADS
6.0000 | MEDICATED_PAD | Freq: Every day | CUTANEOUS | Status: DC
  Administered 2022-05-18 – 2022-05-19 (×2): 6 via TOPICAL

## 2022-05-18 MED ORDER — ZINC OXIDE 12.8 % EX OINT
TOPICAL_OINTMENT | Freq: Four times a day (QID) | CUTANEOUS | Status: DC
  Administered 2022-05-18: 1 via TOPICAL
  Filled 2022-05-18: qty 56.7

## 2022-05-18 MED ORDER — APIXABAN 5 MG PO TABS
10.0000 mg | ORAL_TABLET | Freq: Two times a day (BID) | ORAL | Status: DC
  Administered 2022-05-18 – 2022-05-19 (×3): 10 mg via ORAL
  Filled 2022-05-18 (×3): qty 2

## 2022-05-18 NOTE — TOC Benefit Eligibility Note (Signed)
Patient Teacher, English as a foreign language completed.    The patient is currently admitted and upon discharge could be taking Eliquis 5 mg.  The current 30 day co-pay is $0.00.   The patient is currently admitted and upon discharge could be taking Xarelto 20 mg.  The current 30 day co-pay is $0.00.   The patient is insured through [place], [place] Patient Advocate Specialist [facility] Patient Advocate Team Direct Number: [phone]  Fax: [phone]

## 2022-05-18 NOTE — Progress Notes (Signed)
  Progress Note   Patient: Darrell [name] [id number] DOB: 01-May-1951 DOA: 05/17/2022     1 DOS: the patient was seen and examined on 05/18/2022   Brief hospital course: 71 y.o. male with medical history significant of ALS, stroke with chronic dysphagia on pured diet, CKD stage IV, acute urinary retention on indwelling Foley catheter, BPH, HTN, HLD, seizure disorder, COPD, cigar smoker, sent by member for evaluation of cough, poor intake and new onset of right leg swelling and pain.  Assessment and Plan: Acute DVT -Unprovoked, lifelong systemic anticoagulation indicated. -Initially continued on heparin drip, RV function normal and no PE on VQ -Transitioned to eliquis -Pt incidentally noted to have basal inferior, inferolateral hypokinesis with consideration for cardiac MRI. Pt denies chest pain or sob. Trop neg. Discussed with family at bedside, agrees to f/u closely with Cardiology   Left lower lobe pneumonia ruled in/pulmonary infarct ruled out -He has a new productive cough fevers pneumonia more than PE.  In addition, no active chest pains. -On empiric ceftriaxone and azithromycin -As per family, no history of aspiration pneumonia.   AKI on CKD stage IV -Clinically appears to be volume contracted and dehydrated, hold off diuresis.   -given mild gentle hydration.   Chronic dysphagia -Continue pured diet/dysphagia 1 with honey thick liquids per SLP   Chronic urinary retention on indwelling Foley catheter -Catheter exchanged 8/22 -UA does have leuks, however may be related to chronic cath -1/2 staph species, in blood, suspect contaminant   HTN -Stable, continue amlodipine   COPD -No symptoms signs of acute exacerbation, continue as needed breathing meds.   ALS -No clear symptoms signs of progression.   Cigarette smoke -Cessation education performed       Subjective: Denies sob or chest pains  Physical Exam: Vitals:   05/18/22 0719 05/18/22 0814 05/18/22 1130  05/18/22 1639  BP: 117/65  (!) 124/58 110/61  Pulse: 69 67 68 66  Resp: '16 14 14 11  '$ Temp:   97.9 F (36.6 C) 97.9 F (36.6 C)  TempSrc:   Oral Axillary  SpO2: 94% 96% 95% 98%  Weight:      Height:       General exam: Awake, laying in bed, in nad Respiratory system: Normal respiratory effort, no wheezing Cardiovascular system: regular rate, s1, s2 Gastrointestinal system: Soft, nondistended, positive BS Central nervous system: CN2-12 grossly intact, strength intact Extremities: Perfused, no clubbing Skin: Normal skin turgor, no notable skin lesions seen Psychiatry: Mood normal // no visual hallucinations   Data Reviewed:  Labs reviewed: Na 139, K 4.7, Cr 3.35, WBC 10.7, Hgb 8.8   Family Communication: Pt in room, family at bedside  Disposition: Status is: Inpatient Remains inpatient appropriate because: Severity of illness  Planned Discharge Destination: Home    Author: Marylu Lund, MD 05/18/2022 6:07 PM  For on call review [email or web address].

## 2022-05-18 NOTE — Progress Notes (Signed)
[facility] NOTE Pharmacy Consult for Heparin Indication: DVT Brief A/P: Heparin level slightly supratherapeutic.  Continue Heparin at current rate for now  Allergies  Allergen Reactions   Ace Inhibitors Other (See Comments)    Hyperkalemia   Doxycycline Other (See Comments)    Hiccups, cough, nausea and emesis, elevated liver enzymes, elevated eosinophils, SOB concerning for early DRESS syndrome    Atacand Hct [Candesartan Cilexetil-Hctz] Hives   Shellfish Allergy Hives    Patient Measurements: Height: 6' 6.5" (199.4 cm) Weight: 77.4 kg (170 lb 10.2 oz) IBW/kg (Calculated) : 92.55 Heparin Dosing Weight: 77 kg  Vital Signs: Temp: 97.4 F (36.3 C) (08/23 2243) Temp Source: Oral (08/23 2243) BP: 123/74 (08/23 2243) Pulse Rate: 65 (08/23 2130)  Labs: Recent Labs    05/17/22 1213 05/17/22 1315 05/17/22 1931 05/17/22 2320  HGB 9.9*  --   --   --   HCT 30.5*  --   --   --   PLT 206  --   --   --   LABPROT  --   --   --  14.9  INR  --   --   --  1.2  HEPARINUNFRC  --   --  0.79* 0.73*  CREATININE 3.87*  --   --   --   TROPONINIHS  --  15 18*  --      Estimated Creatinine Clearance: 19.4 mL/min (A) (by C-G formula based on SCr of 3.87 mg/dL (H)).   Assessment: 71 y.o. male with DVT for heparin.    Goal of Therapy:  Heparin level 0.3-0.7 units/ml Monitor platelets by anticoagulation protocol: Yes   Plan:  Continue Heparin at current rate for now.  Follow-up am labs and adjust rate if needed at that time  Phillis Knack, PharmD, BCPS

## 2022-05-18 NOTE — Progress Notes (Signed)
PHARMACY - PHYSICIAN COMMUNICATION CRITICAL VALUE ALERT - BLOOD CULTURE IDENTIFICATION (BCID)  Darrell [name] is an 71 y.o. male who presented to [facility] on 05/17/2022 with a chief complaint of cough, found to be pneumonia.   Assessment: Bcx 1/4 GPC in clusters, BCID identifying as staph spp with no resistance (no organism identified) -- this is likely a contaminant.   Name of physician (or Provider) Contacted: Donne Hazel, MD   Current antibiotics: ceftriaxone 2g q24h, azithromycin '500mg'$  daily   Changes to prescribed antibiotics recommended:  Patient is on recommended antibiotics - No changes needed.   Results for orders placed or performed during the hospital encounter of 05/17/22  Blood Culture ID Panel (Reflexed) (Collected: 05/17/2022  1:20 PM)  Result Value Ref Range   Enterococcus faecalis NOT DETECTED NOT DETECTED   Enterococcus Faecium NOT DETECTED NOT DETECTED   Listeria monocytogenes NOT DETECTED NOT DETECTED   Staphylococcus species DETECTED (A) NOT DETECTED   Staphylococcus aureus (BCID) NOT DETECTED NOT DETECTED   Staphylococcus epidermidis NOT DETECTED NOT DETECTED   Staphylococcus lugdunensis NOT DETECTED NOT DETECTED   Streptococcus species NOT DETECTED NOT DETECTED   Streptococcus agalactiae NOT DETECTED NOT DETECTED   Streptococcus pneumoniae NOT DETECTED NOT DETECTED   Streptococcus pyogenes NOT DETECTED NOT DETECTED   A.calcoaceticus-baumannii NOT DETECTED NOT DETECTED   Bacteroides fragilis NOT DETECTED NOT DETECTED   Enterobacterales NOT DETECTED NOT DETECTED   Enterobacter cloacae complex NOT DETECTED NOT DETECTED   Escherichia coli NOT DETECTED NOT DETECTED   Klebsiella aerogenes NOT DETECTED NOT DETECTED   Klebsiella oxytoca NOT DETECTED NOT DETECTED   Klebsiella pneumoniae NOT DETECTED NOT DETECTED   Proteus species NOT DETECTED NOT DETECTED   Salmonella species NOT DETECTED NOT DETECTED   Serratia marcescens NOT DETECTED NOT DETECTED    Haemophilus influenzae NOT DETECTED NOT DETECTED   Neisseria meningitidis NOT DETECTED NOT DETECTED   Pseudomonas aeruginosa NOT DETECTED NOT DETECTED   Stenotrophomonas maltophilia NOT DETECTED NOT DETECTED   Candida albicans NOT DETECTED NOT DETECTED   Candida auris NOT DETECTED NOT DETECTED   Candida glabrata NOT DETECTED NOT DETECTED   Candida krusei NOT DETECTED NOT DETECTED   Candida parapsilosis NOT DETECTED NOT DETECTED   Candida tropicalis NOT DETECTED NOT DETECTED   Cryptococcus neoformans/gattii NOT DETECTED NOT DETECTED    Billey Gosling, PharmD PGY1 Pharmacy Resident 8/24/20231:32 PM

## 2022-05-18 NOTE — Evaluation (Signed)
Clinical/Bedside Swallow Evaluation Patient Details  Name: Darrell [name] MRN: [id number] Date of Birth: 12-18-1950  Today's Date: 05/18/2022 Time: SLP Start Time (ACUTE ONLY): 14 SLP Stop Time (ACUTE ONLY): 1500 SLP Time Calculation (min) (ACUTE ONLY): 25 min  Past Medical History:  Past Medical History:  Diagnosis Date   Arthritis    Asthma    At high risk for falls 08/16/2015   CIDP (chronic inflammatory demyelinating polyneuropathy) ([name])    CKD (chronic kidney disease) stage 3, GFR 30-59 ml/min ([place]) 08/16/2015   Dysphagia as late effect of cerebrovascular disease    pts wife states pt has to eat soft foods    Elevated liver enzymes 08/10/2016   GERD (gastroesophageal reflux disease)    Glaucoma    High cholesterol    History of CVA with residual deficit 03/25/2013   Hypertension    Hypertensive retinopathy of both eyes 01/16/2017   Inguinal hernia 03/25/2013   Liver hemangioma 08/14/2016   New onset seizure ([place]) 07/08/2017   seizure 07/14/18   Nuclear sclerosis of both eyes 10/25/2016   Pneumonia    Presence of permanent cardiac pacemaker    Primary open angle glaucoma of both eyes, indeterminate stage 10/25/2016   Renal mass, right 08/14/2016   Status cardiac pacemaker 01/29/2017   Placed for second degree heart block on 01/16/17 Medtronic Azure XT DR MRI SureScan dual-chamber pacemaker   Stroke [facility])    2011 with residual deficit left sided weakness   Tobacco dependence    Past Surgical History:  Past Surgical History:  Procedure Laterality Date   EYE SURGERY     HERNIA REPAIR     INGUINAL HERNIA REPAIR Right 11/23/2014   Procedure: right inguinal hernia repair with mesh;  Surgeon: Armandina Gemma, MD;  Location: [facility];  Service: General;  Laterality: Right;   INSERTION OF MESH N/A 11/23/2014   Procedure: INSERTION OF MESH;  Surgeon: Armandina Gemma, MD;  Location: [facility];  Service: General;  Laterality: N/A;   JOINT REPLACEMENT     MASS EXCISION Left 08/29/2017    Procedure: EXCISION OF LEFT NECK MASS;  Surgeon: Coralie Keens, MD;  Location: [place];  Service: General;  Laterality: Left;   PACEMAKER IMPLANT N/A 01/16/2017   Procedure: Pacemaker Implant;  Surgeon: Will Meredith Leeds, MD;  Location: [place] CV LAB;  Service: Cardiovascular;  Laterality: N/A;   SHOULDER SURGERY Bilateral 1988, 1998   HPI:  Darrell [name] is a 71 y.o. male with advanced ALS, bedbound,  admitted by family member for evaluation of cough, poor intake and new onset of right leg swelling and pain. Pt with medical history significant of stroke with chronic dysphagia on honey thick liquid/ pured diet. Pt with multiple hospitalizations this year. Pt with recent hx of silent aspiration per MBS (03/24/22), which was preceded by a BSE (03/24/22) which revealed "no s/s aspiration present with 3 oz water straw presentation".  CKD stage IV, acute urinary retention on indwelling Foley catheter, BPH, HTN, HLD, seizure disorder, COPD, cigar smoker.    Assessment / Plan / Recommendation  Clinical Impression  Pt observed with puree and thin water. No overt signs of aspiration with consecutive sips of thin. However pt with known silent aspiration. He is hypophonic and baseline cough is weak. Spent a long time discussing dysphagia with his wife who reports not often using thickener because he seems to be fine when drinking. We talked about sensory loss with ALS and stroke with high probability of aspiration events,  especially when he is weaker than normal. Talked about the rationale for thickened liquids if pt accepts and enjoys them. We discussed QOL with oral intake and which food and liquid textures would be appropriate. Wife reports she does want to continue thickening if possible, but worries about access and cost of thickener. We talked about the water protocol and importance of oral hygiene. SLP will return tomrorow to reinforce these concepts with written education. requested bulk individual  thickener samples from pharmacy if possible. Will bring some more from the office.  For now continue puree and honey thick liquids with thin water allowed.      Aspiration Risk       Diet Recommendation Dysphagia 1 (Puree);Honey-thick liquid;Free water protocol after oral care   Liquid Administration via: Cup;Straw Medication Administration: Crushed with puree Supervision: Staff to assist with self feeding;Full supervision/cueing for compensatory strategies Compensations: Slow rate;Small sips/bites Postural Changes: Seated upright at 90 degrees    Other  Recommendations Oral Care Recommendations: Oral care before and after PO Other Recommendations: Have oral suction available    Recommendations for follow up therapy are one component of a multi-disciplinary discharge planning process, led by the attending physician.  Recommendations may be updated based on patient status, additional functional criteria and insurance authorization.  Follow up Recommendations Home health SLP      Assistance Recommended at Discharge    Functional Status Assessment    Frequency and Duration            Prognosis        Swallow Study   General HPI: Darrell [name] is a 71 y.o. male with advanced ALS, bedbound,  admitted by family member for evaluation of cough, poor intake and new onset of right leg swelling and pain. Pt with medical history significant of stroke with chronic dysphagia on honey thick liquid/ pured diet. Pt with multiple hospitalizations this year. Pt with recent hx of silent aspiration per MBS (03/24/22), which was preceded by a BSE (03/24/22) which revealed "no s/s aspiration present with 3 oz water straw presentation".  CKD stage IV, acute urinary retention on indwelling Foley catheter, BPH, HTN, HLD, seizure disorder, COPD, cigar smoker. Type of Study: Bedside Swallow Evaluation Previous Swallow Assessment: see HPI Diet Prior to this Study: Dysphagia 1 (puree);Thin  liquids Temperature Spikes Noted: No History of Recent Intubation: No Behavior/Cognition: Alert;Requires cueing Oral Care Completed by SLP: No Oral Cavity - Dentition: Edentulous Self-Feeding Abilities: Total assist Patient Positioning: Upright in bed Baseline Vocal Quality: Low vocal intensity Volitional Cough: Weak    Oral/Motor/Sensory Function     Ice Chips     Thin Liquid Thin Liquid: Within functional limits    Nectar Thick Nectar Thick Liquid: Not tested   Honey Thick Honey Thick Liquid: Not tested   Puree Puree: Within functional limits   Solid            Levette Paulick, Katherene Ponto 05/18/2022,3:28 PM

## 2022-05-18 NOTE — Telephone Encounter (Signed)
Pharmacy Patient Advocate Encounter  Insurance verification completed.    The patient is insured through [employer]   The patient is currently admitted and ran test claims for the following: Eliquis, Xarelto.  Copays and coinsurance results were relayed to Inpatient clinical team.

## 2022-05-18 NOTE — Progress Notes (Signed)
[place] for Heparin > Eliquis Indication: DVT  Allergies  Allergen Reactions   Ace Inhibitors Other (See Comments)    Hyperkalemia   Doxycycline Other (See Comments)    Hiccups, cough, nausea and emesis, elevated liver enzymes, elevated eosinophils, SOB concerning for early DRESS syndrome    Atacand Hct [Candesartan Cilexetil-Hctz] Hives   Shellfish Allergy Hives    Patient Measurements: Height: 6' 6.5" (199.4 cm) Weight: 77.4 kg (170 lb 10.2 oz) IBW/kg (Calculated) : 92.55 Heparin Dosing Weight: 77 kg  Vital Signs: Temp: 97.9 F (36.6 C) (08/24 1130) Temp Source: Oral (08/24 1130) BP: 124/58 (08/24 1130) Pulse Rate: 68 (08/24 1130)  Labs: Recent Labs    05/17/22 1213 05/17/22 1315 05/17/22 1931 05/17/22 1931 05/17/22 2320 05/18/22 0307 05/18/22 1238  HGB 9.9*  --   --   --   --  8.8*  --   HCT 30.5*  --   --   --   --  27.1*  --   PLT 206  --   --   --   --  198  --   LABPROT  --   --   --   --  14.9  --   --   INR  --   --   --   --  1.2  --   --   HEPARINUNFRC  --   --  0.79*   < > 0.73* 0.83* 0.83*  CREATININE 3.87*  --   --   --   --  3.35*  --   TROPONINIHS  --  15 18*  --   --   --   --    < > = values in this interval not displayed.     Estimated Creatinine Clearance: 22.5 mL/min (A) (by C-G formula based on SCr of 3.35 mg/dL (H)).   Medical History: Past Medical History:  Diagnosis Date   Arthritis    Asthma    At high risk for falls 08/16/2015   CIDP (chronic inflammatory demyelinating polyneuropathy) ([name])    CKD (chronic kidney disease) stage 3, GFR 30-59 ml/min ([name]) 08/16/2015   Dysphagia as late effect of cerebrovascular disease    pts wife states pt has to eat soft foods    Elevated liver enzymes 08/10/2016   GERD (gastroesophageal reflux disease)    Glaucoma    High cholesterol    History of CVA with residual deficit 03/25/2013   Hypertension    Hypertensive retinopathy of both eyes  01/16/2017   Inguinal hernia 03/25/2013   Liver hemangioma 08/14/2016   New onset seizure ([place]) 07/08/2017   seizure 07/14/18   Nuclear sclerosis of both eyes 10/25/2016   Pneumonia    Presence of permanent cardiac pacemaker    Primary open angle glaucoma of both eyes, indeterminate stage 10/25/2016   Renal mass, right 08/14/2016   Status cardiac pacemaker 01/29/2017   Placed for second degree heart block on 01/16/17 Medtronic Azure XT DR MRI SureScan dual-chamber pacemaker   Stroke [facility])    2011 with residual deficit left sided weakness   Tobacco dependence     Medications:   Infusions:   sodium chloride 100 mL/hr at 05/18/22 0437   cefTRIAXone (ROCEPHIN)  IV 2 g (05/18/22 1055)   heparin 1,150 Units/hr (05/18/22 0443)   PRN:   Assessment: 83 yom with a history of COPD, seizure, AMS, CKD, heart block w/ pacemaker. Patient is presenting with cough and cloudy urine as  well as rt thigh pain. Heparin per pharmacy consult placed for DVT.  [place] c/w acute DVT Patient is not on anticoagulation prior to arrival.  Hgb down slightly.  No overt bleeding noted.  Heparin level remains elevated this afternoon despite drip rate reduction earlier this morning.  Discussed with Dr. Wyline Copas - okay to switch to Eliquis today.  Received ~ 1 day of heparin.  Goal of Therapy:  Heparin level 0.3-0.7 units/ml Monitor platelets by anticoagulation protocol: Yes   Plan:  Eliquis 10 mg po BID x 6 more days Then Eliquis 5 mg BID. Will educate pt about Eliquis prior to discharge.  [place] Crane, Roylene Reason, BCCP Clinical Pharmacist  05/18/2022 2:00 PM   [facility] pharmacy phone numbers are listed on [email or web address]

## 2022-05-18 NOTE — Consult Note (Signed)
[place] Nurse Consult Note: Patient receiving care in [place]. Spouse present in room providing wound history. NT assisting with turning and cleansing of incontinent BM. Reason for Consult: skin breakdown on buttocks Wound type: The patient's spouse explained that the patient was at a doctor's appointment earlier this week and had to sit for a while and was moved around quite a bit. After that he complained of his bottom being sore. Today there are multiple hypopigmented but healed areas on the buttocks and sacrum, and some abrasion areas on the right buttock. No depth to these areas, no signs of deeper tissue damage, just superficial abrasions from friction with moving. Pressure Injury POA: NA Measurement: Wound bed: 100% pink with rolled up, peeling skin. Drainage (amount, consistency, odor) none Periwound: intact Dressing procedure/placement/frequency: Apply Triple Paste to buttocks 4 times daily and prn with each incontinence cleansing.  Turn the patient every 2 hours.  Monitor the wound area(s) for worsening of condition such as: Signs/symptoms of infection,  Increase in size,  Development of or worsening of odor, Development of pain, or increased pain at the affected locations.  Notify the medical team if any of these develop.  Thank you for the consult.  Discussed plan of care with the patient and bedside nurse.  [place] nurse will not follow at this time.  Please re-consult the [facility] team if needed.  Val Riles, RN, MSN, CWOCN, CNS-BC, pager [phone]

## 2022-05-18 NOTE — Hospital Course (Signed)
71 y.o. male with medical history significant of ALS, stroke with chronic dysphagia on pured diet, CKD stage IV, acute urinary retention on indwelling Foley catheter, BPH, HTN, HLD, seizure disorder, COPD, cigar smoker, sent by member for evaluation of cough, poor intake and new onset of right leg swelling and pain.

## 2022-05-18 NOTE — Progress Notes (Signed)
ANTICOAGULATION CONSULT NOTE   Pharmacy Consult for Heparin Indication: DVT  Allergies  Allergen Reactions   Ace Inhibitors Other (See Comments)    Hyperkalemia   Doxycycline Other (See Comments)    Hiccups, cough, nausea and emesis, elevated liver enzymes, elevated eosinophils, SOB concerning for early DRESS syndrome    Atacand Hct [Candesartan Cilexetil-Hctz] Hives   Shellfish Allergy Hives    Patient Measurements: Height: 6' 6.5" (199.4 cm) Weight: 77.4 kg (170 lb 10.2 oz) IBW/kg (Calculated) : 92.55 Heparin Dosing Weight: 77 kg  Vital Signs: Temp: 97.5 F (36.4 C) (08/24 0401) Temp Source: Oral (08/24 0401) BP: 115/65 (08/24 0401) Pulse Rate: 63 (08/24 0401)  Labs: Recent Labs    05/17/22 1213 05/17/22 1315 05/17/22 1931 05/17/22 2320 05/18/22 0307  HGB 9.9*  --   --   --  8.8*  HCT 30.5*  --   --   --  27.1*  PLT 206  --   --   --  198  LABPROT  --   --   --  14.9  --   INR  --   --   --  1.2  --   HEPARINUNFRC  --   --  0.79* 0.73* 0.83*  CREATININE 3.87*  --   --   --  3.35*  TROPONINIHS  --  15 18*  --   --      Estimated Creatinine Clearance: 22.5 mL/min (A) (by C-G formula based on SCr of 3.35 mg/dL (H)).   Medical History: Past Medical History:  Diagnosis Date   Arthritis    Asthma    At high risk for falls 08/16/2015   CIDP (chronic inflammatory demyelinating polyneuropathy) ([name])    CKD (chronic kidney disease) stage 3, GFR 30-59 ml/min ([name]) 08/16/2015   Dysphagia as late effect of cerebrovascular disease    pts wife states pt has to eat soft foods    Elevated liver enzymes 08/10/2016   GERD (gastroesophageal reflux disease)    Glaucoma    High cholesterol    History of CVA with residual deficit 03/25/2013   Hypertension    Hypertensive retinopathy of both eyes 01/16/2017   Inguinal hernia 03/25/2013   Liver hemangioma 08/14/2016   New onset seizure ([place]) 07/08/2017   seizure 07/14/18   Nuclear sclerosis of both eyes 10/25/2016    Pneumonia    Presence of permanent cardiac pacemaker    Primary open angle glaucoma of both eyes, indeterminate stage 10/25/2016   Renal mass, right 08/14/2016   Status cardiac pacemaker 01/29/2017   Placed for second degree heart block on 01/16/17 Med[employer]onic Azure XT DR MRI SureScan dual-chamber pacemaker   Stroke [facility])    2011 with residual deficit left sided weakness   Tobacco dependence     Medications:  Medications Prior to Admission  Medication Sig Dispense Refill Last Dose   acetaminophen (TYLENOL) 325 MG tablet Take 325 mg by mouth daily as needed for mild pain or moderate pain.   05/16/2022   albuterol (VENTOLIN HFA) 108 (90 Base) MCG/ACT inhaler Inhale 2 puffs into the lungs every 6 (six) hours as needed for up to 30 days for Wheezing. 18 g 5 05/16/2022   amLODipine (NORVASC) 10 MG tablet Take 1 tablet by mouth once daily 90 tablet 0 05/16/2022   atorvastatin (LIPITOR) 40 MG tablet Take 1 tablet (40 mg total) by mouth daily. 90 tablet 1 05/16/2022   azithromycin (ZITHROMAX) 500 MG tablet Take 1 tablet (500 mg total) by mouth  See admin instructions. Take 1 tablet by mouth Monday,Wednesday and Friday 12 tablet 2 05/16/2022   brimonidine (ALPHAGAN) 0.2 % ophthalmic solution Place 1 drop into both eyes daily.   05/17/2022   cetirizine (ZYRTEC) 10 MG tablet Take 1 tablet (10 mg total) by mouth daily. 90 tablet 1 05/17/2022   dicyclomine (BENTYL) 10 MG capsule Take 10 mg by mouth daily as needed (pain).   05/16/2022   dorzolamide-timolol (COSOPT) 22.3-6.8 MG/ML ophthalmic solution Place 1 drop into both eyes daily in the afternoon.   05/17/2022   ferrous sulfate (FEROSUL) 325 (65 FE) MG tablet Take 1 tablet (325 mg total) by mouth 2 (two) times daily with a meal. 60 tablet 2 05/16/2022   finasteride (PROSCAR) 5 MG tablet Take 5 mg by mouth daily.   05/16/2022   fluticasone (FLONASE) 50 MCG/ACT nasal spray Place 1 spray into both nostrils daily.   05/16/2022   fluticasone furoate-vilanterol (BREO  ELLIPTA) 200-25 MCG/ACT AEPB Inhale 2 puffs into the lungs daily.   05/16/2022   glycopyrrolate (ROBINUL) 1 MG tablet Take 1 tablet (1 mg total) by mouth 2 times daily. 60 tablet 5 05/16/2022   guaiFENesin (MUCINEX) 600 MG 12 hr tablet Take 600 mg by mouth 2 (two) times daily.   05/17/2022 at am   hydrALAZINE (APRESOLINE) 25 MG tablet Take 1 tablet (25 mg total) by mouth 3 (three) times daily. 180 tablet 3 05/17/2022 at am   latanoprost (XALATAN) 0.005 % ophthalmic solution Place 1 drop into both eyes at bedtime.   05/17/2022   levETIRAcetam (KEPPRA) 500 MG tablet Take 1 tablet (500 mg total) by mouth 2 (two) times daily. 180 tablet 4 05/17/2022 at am   metoprolol succinate (TOPROL-XL) 50 MG 24 hr tablet Take 1 tablet (50 mg total) by mouth at bedtime. Take with or immediately following a meal. 30 tablet 3 05/16/2022   montelukast (SINGULAIR) 10 MG tablet Take 10 mg by mouth at bedtime.   05/16/2022   Multiple Vitamin (MULTI-VITAMIN) tablet Take 1 tablet by mouth daily.   05/16/2022   ondansetron (ZOFRAN) 4 MG tablet Take 1 tablet (4 mg total) by mouth every 8 (eight) hours as needed for nausea or vomiting. 20 tablet 0 05/16/2022   polyethylene glycol (MIRALAX) 17 g packet Take 17 g by mouth daily. 14 each 0 Past Month   tamsulosin (FLOMAX) 0.4 MG CAPS capsule Take 1 capsule (0.4 mg total) by mouth daily. 90 capsule 1 05/16/2022   torsemide (DEMADEX) 20 MG tablet Take 20 mg by mouth daily as needed (fluid).   05/16/2022    Scheduled:   amLODipine  10 mg Oral Daily   atorvastatin  40 mg Oral Daily   azithromycin  500 mg Oral Daily   brimonidine  1 drop Both Eyes Daily   dorzolamide-timolol  1 drop Both Eyes Q1500   ferrous sulfate  325 mg Oral BID WC   finasteride  5 mg Oral Daily   fluticasone  1 spray Each Nare Daily   fluticasone furoate-vilanterol  2 puff Inhalation Daily   glycopyrrolate  1 mg Oral BID   guaiFENesin  1,200 mg Oral BID   hydrALAZINE  25 mg Oral TID   ipratropium-albuterol  3 mL  Nebulization Q6H   latanoprost  1 drop Both Eyes QHS   levETIRAcetam  500 mg Oral BID   loratadine  10 mg Oral Daily   metoprolol succinate  50 mg Oral QHS   montelukast  10 mg Oral QHS  polyethylene glycol  17 g Oral Daily   tamsulosin  0.4 mg Oral Daily   Infusions:   sodium chloride 100 mL/hr at 05/17/22 1823   cefTRIAXone (ROCEPHIN)  IV     heparin 1,300 Units/hr (05/17/22 1619)   PRN:   Assessment: 54 yom with a history of COPD, seizure, AMS, CKD, heart block w/ pacemaker. Patient is presenting with cough and cloudy urine as well as rt thigh pain. Heparin per pharmacy consult placed for DVT.  [place] c/w acute DVT Patient is not on anticoagulation prior to arrival.  Hgb 9.9; plt 206  8/24 AM update:  Heparin level supra-therapeutic   Goal of Therapy:  Heparin level 0.3-0.7 units/ml Monitor platelets by anticoagulation protocol: Yes   Plan:  Dec heparin to 1150 units/hr 1200 heparin level  Narda Bonds, PharmD, [place] Pharmacist Phone: [phone]

## 2022-05-19 ENCOUNTER — Encounter (HOSPITAL_COMMUNITY): Payer: Self-pay | Admitting: Internal Medicine

## 2022-05-19 ENCOUNTER — Other Ambulatory Visit (HOSPITAL_COMMUNITY): Payer: Self-pay

## 2022-05-19 ENCOUNTER — Other Ambulatory Visit: Payer: Self-pay

## 2022-05-19 DIAGNOSIS — J189 Pneumonia, unspecified organism: Secondary | ICD-10-CM | POA: Diagnosis not present

## 2022-05-19 DIAGNOSIS — Z7401 Bed confinement status: Secondary | ICD-10-CM | POA: Diagnosis not present

## 2022-05-19 DIAGNOSIS — Z743 Need for continuous supervision: Secondary | ICD-10-CM | POA: Diagnosis not present

## 2022-05-19 DIAGNOSIS — R059 Cough, unspecified: Secondary | ICD-10-CM | POA: Diagnosis not present

## 2022-05-19 LAB — CULTURE, BLOOD (ROUTINE X 2): Special Requests: ADEQUATE

## 2022-05-19 LAB — CBC
HCT: 24.8 % — ABNORMAL LOW (ref 39.0–52.0)
Hemoglobin: 8.1 g/dL — ABNORMAL LOW (ref 13.0–17.0)
MCH: 29.7 pg (ref 26.0–34.0)
MCHC: 32.7 g/dL (ref 30.0–36.0)
MCV: 90.8 fL (ref 80.0–100.0)
Platelets: 185 10*3/uL (ref 150–400)
RBC: 2.73 MIL/uL — ABNORMAL LOW (ref 4.22–5.81)
RDW: 14.4 % (ref 11.5–15.5)
WBC: 8.3 10*3/uL (ref 4.0–10.5)
nRBC: 0 % (ref 0.0–0.2)

## 2022-05-19 LAB — COMPREHENSIVE METABOLIC PANEL
ALT: 14 U/L (ref 0–44)
AST: 10 U/L — ABNORMAL LOW (ref 15–41)
Albumin: 2.4 g/dL — ABNORMAL LOW (ref 3.5–5.0)
Alkaline Phosphatase: 61 U/L (ref 38–126)
Anion gap: 7 (ref 5–15)
BUN: 69 mg/dL — ABNORMAL HIGH (ref 8–23)
CO2: 18 mmol/L — ABNORMAL LOW (ref 22–32)
Calcium: 8.8 mg/dL — ABNORMAL LOW (ref 8.9–10.3)
Chloride: 116 mmol/L — ABNORMAL HIGH (ref 98–111)
Creatinine, Ser: 3.29 mg/dL — ABNORMAL HIGH (ref 0.61–1.24)
GFR, Estimated: 19 mL/min — ABNORMAL LOW (ref >60)
Glucose, Bld: 93 mg/dL (ref 70–99)
Potassium: 4.6 mmol/L (ref 3.5–5.1)
Sodium: 141 mmol/L (ref 135–145)
Total Bilirubin: 0.3 mg/dL (ref 0.3–1.2)
Total Protein: 4.9 g/dL — ABNORMAL LOW (ref 6.5–8.1)

## 2022-05-19 MED ORDER — CEFPODOXIME PROXETIL 200 MG PO TABS
100.0000 mg | ORAL_TABLET | Freq: Two times a day (BID) | ORAL | 0 refills | Status: AC
  Filled 2022-05-19: qty 5, 5d supply, fill #0

## 2022-05-19 MED ORDER — APIXABAN (ELIQUIS) VTE STARTER PACK (10MG AND 5MG)
ORAL_TABLET | ORAL | 0 refills | Status: DC
  Filled 2022-05-19: qty 74, 30d supply, fill #0

## 2022-05-19 NOTE — TOC Transition Note (Addendum)
Transition of Care (TOC) - CM/SW Discharge Note Marvetta Gibbons RN, BSN Transitions of Care Unit 4E- RN Case Manager See Treatment Team for direct phone #    Patient Details  Name: Darrell [name] MRN: [id number] Date of Birth: 01-14-1951  Transition of Care [facility]) CM/SW Contact:  Dawayne Patricia, RN Phone Number: 05/19/2022, 3:24 PM   Clinical Narrative:    Pt stable for transition home, from home w/ wife. CM spoke with wife via TC- per wife she is pt's primary caregiver and they have needed DME at home. Pt transport via EMS. Address confirmed for transport.   [facility] called for transport- ETA- there are currently 5 patient ahead on wait list. Pt has been added and they will get to him as soon as they can. Paperwork and GOLD DNR on chart.   Eliquis starter pack to come from [place].   RNCM will sign off for now as intervention is no longer needed. Please re-consult  if new needs arise, or contact RNCM assigned to treatment team for further questions/concerns.     7- Msg received from SLP- therapist- recommending- [place] sent to MD regarding [place] needs- requesting - HHRN/SLP. Orders have been placed. CM spoke with wife at bedside- List brought for [facility] choice Per CMS guidelines from medicare.gov website with star ratings (copy placed in shadow chart) - however wife states pt has had [place] services recently with [facility] and she would like to continue with them. Call made to [facility] with [place] has had RN/PT with them- she will review new [place] orders and referral has been accepted- if [facility] is unable to provide SLP services Levada Dy states she will reach out to [place] to make sure pt receives needed services and cross refer if needed.   Pt still awaiting transport home via [facility]   Final next level of care: Home/Self Care Barriers to Discharge: No Barriers Identified   Patient Goals and CMS Choice Patient states their goals for this hospitalization and ongoing recovery are::  return home   Choice offered to / list presented to : NA  Discharge Placement                 Home      Discharge Plan and Services In-house Referral: NA Discharge Planning Services: NA Post Acute Care Choice: NA          DME Arranged: N/A DME Agency: NA       HH Arranged: NA HH Agency: NA        Social Determinants of Health (SDOH) Interventions     Readmission Risk Interventions    05/19/2022    3:24 PM  Readmission Risk Prevention Plan  Transportation Screening Complete  Medication Review (RN Care Manager) Complete  HRI or [place] Complete  [place] Not Applicable

## 2022-05-19 NOTE — Progress Notes (Signed)
SLP Cancellation Note  Patient Details Name: KENNIS WISSMANN MRN: [id number] DOB: 1951/07/22   Cancelled treatment:       Reason Eval/Treat Not Completed: Other (comment). Wife not at bedside, but SLP left written education and thickener samples with pt. Discussed with RN. Pt to have home health SLP f/u as wife needs further education and assist with thickening at home.    Barbera Perritt, Katherene Ponto 05/19/2022, 3:36 PM

## 2022-05-19 NOTE — Discharge Summary (Signed)
Physician Discharge Summary   Patient: Darrell [name] MRN: [id number] DOB: 13-Feb-1951  Admit date:     05/17/2022  Discharge date: 05/19/22  Discharge Physician: Marylu Lund   PCP: [name], MD   Recommendations at discharge:    Follow up with PCP in 1-2 weeks  Discharge Diagnoses: Principal Problem:   PNA (pneumonia) Active Problems:   DVT (deep venous thrombosis) ([place])  Resolved Problems:   * No resolved hospital problems. *  Hospital Course: 71 y.o. male with medical history significant of ALS, stroke with chronic dysphagia on pured diet, CKD stage IV, acute urinary retention on indwelling Foley catheter, BPH, HTN, HLD, seizure disorder, COPD, cigar smoker, sent by member for evaluation of cough, poor intake and new onset of right leg swelling and pain.  Assessment and Plan: Acute DVT -Unprovoked, lifelong systemic anticoagulation indicated. -Initially continued on heparin drip, RV function normal and no PE on VQ -Transitioned to eliquis -Pt incidentally noted to have basal inferior, inferolateral hypokinesis with consideration for cardiac MRI. Pt denies chest pain or sob. Trop neg. Discussed with family at bedside, agrees to f/u closely with Cardiology   Left lower lobe pneumonia ruled in/pulmonary infarct ruled out -He has a new productive cough fevers pneumonia more than PE.  In addition, no active chest pains. -On empiric ceftriaxone and azithromycin -As per family, no history of aspiration pneumonia.   AKI on CKD stage IV -Clinically appears to be volume contracted and dehydrated, hold off diuresis.   -given mild gentle hydration.   Chronic dysphagia -Continue pured diet/dysphagia 1 with honey thick liquids per SLP   Chronic urinary retention on indwelling Foley catheter -Catheter exchanged 8/22 -UA does have leuks, however may be related to chronic cath -1/2 staph species, in blood, suspect contaminant   HTN -Stable, continue amlodipine   COPD -No  symptoms signs of acute exacerbation, continue as needed breathing meds.   ALS -No clear symptoms signs of progression.   Cigarette smoke -Cessation education performed        Consultants:  Procedures performed:   Disposition: Home Diet recommendation:  Dysphagia type 1 Honey thick Liquid DISCHARGE MEDICATION: Allergies as of 05/19/2022       Reactions   Ace Inhibitors Other (See Comments)   Hyperkalemia   Doxycycline Other (See Comments)   Hiccups, cough, nausea and emesis, elevated liver enzymes, elevated eosinophils, SOB concerning for early DRESS syndrome    Atacand Hct [candesartan Cilexetil-hctz] Hives   Shellfish Allergy Hives        Medication List     TAKE these medications    acetaminophen 325 MG tablet Commonly known as: TYLENOL Take 325 mg by mouth daily as needed for mild pain or moderate pain.   albuterol 108 (90 Base) MCG/ACT inhaler Commonly known as: VENTOLIN HFA Inhale 2 puffs into the lungs every 6 (six) hours as needed for up to 30 days for Wheezing.   amLODipine 10 MG tablet Commonly known as: NORVASC Take 1 tablet by mouth once daily   Apixaban Starter Pack ('10mg'$  and '5mg'$ ) Commonly known as: ELIQUIS STARTER PACK Take as directed on package: start with two-'5mg'$  tablets twice daily for 7 days. On day 8, switch to one-'5mg'$  tablet twice daily.   atorvastatin 40 MG tablet Commonly known as: LIPITOR Take 1 tablet (40 mg total) by mouth daily.   azithromycin 500 MG tablet Commonly known as: ZITHROMAX Take 1 tablet (500 mg total) by mouth See admin instructions. Take 1 tablet by mouth Monday,Wednesday and  Friday   Breo Ellipta 200-25 MCG/ACT Aepb Generic drug: fluticasone furoate-vilanterol Inhale 2 puffs into the lungs daily.   brimonidine 0.2 % ophthalmic solution Commonly known as: ALPHAGAN Place 1 drop into both eyes daily.   cefpodoxime 100 MG tablet Commonly known as: VANTIN Take 1 tablet (100 mg total) by mouth 2 (two) times daily  for 5 days.   cetirizine 10 MG tablet Commonly known as: ZYRTEC Take 1 tablet (10 mg total) by mouth daily.   dicyclomine 10 MG capsule Commonly known as: BENTYL Take 10 mg by mouth daily as needed (pain).   dorzolamide-timolol 22.3-6.8 MG/ML ophthalmic solution Commonly known as: COSOPT Place 1 drop into both eyes daily in the afternoon.   ferrous sulfate 325 (65 FE) MG tablet Commonly known as: FeroSul Take 1 tablet (325 mg total) by mouth 2 (two) times daily with a meal.   finasteride 5 MG tablet Commonly known as: PROSCAR Take 5 mg by mouth daily.   fluticasone 50 MCG/ACT nasal spray Commonly known as: FLONASE Place 1 spray into both nostrils daily.   glycopyrrolate 1 MG tablet Commonly known as: ROBINUL Take 1 tablet (1 mg total) by mouth 2 times daily.   guaiFENesin 600 MG 12 hr tablet Commonly known as: MUCINEX Take 600 mg by mouth 2 (two) times daily.   hydrALAZINE 25 MG tablet Commonly known as: APRESOLINE Take 1 tablet (25 mg total) by mouth 3 (three) times daily.   latanoprost 0.005 % ophthalmic solution Commonly known as: XALATAN Place 1 drop into both eyes at bedtime.   levETIRAcetam 500 MG tablet Commonly known as: KEPPRA Take 1 tablet (500 mg total) by mouth 2 (two) times daily.   metoprolol succinate 50 MG 24 hr tablet Commonly known as: TOPROL-XL Take 1 tablet (50 mg total) by mouth at bedtime. Take with or immediately following a meal.   montelukast 10 MG tablet Commonly known as: SINGULAIR Take 10 mg by mouth at bedtime.   Multi-Vitamin tablet Take 1 tablet by mouth daily.   ondansetron 4 MG tablet Commonly known as: Zofran Take 1 tablet (4 mg total) by mouth every 8 (eight) hours as needed for nausea or vomiting.   polyethylene glycol 17 g packet Commonly known as: MiraLax Take 17 g by mouth daily.   tamsulosin 0.4 MG Caps capsule Commonly known as: FLOMAX Take 1 capsule (0.4 mg total) by mouth daily.   torsemide 20 MG  tablet Commonly known as: DEMADEX Take 20 mg by mouth daily as needed (fluid).        Discharge Exam: Filed Weights   05/17/22 1356 05/17/22 2243  Weight: 77.1 kg 77.4 kg   General exam: Awake, laying in bed, in nad Respiratory system: Normal respiratory effort, no wheezing Cardiovascular system: regular rate, s1, s2 Gastrointestinal system: Soft, nondistended, positive BS Central nervous system: CN2-12 grossly intact, strength intact Extremities: Perfused, no clubbing Skin: Normal skin turgor, no notable skin lesions seen Psychiatry: Mood normal // no visual hallucinations   Condition at discharge: fair  The results of significant diagnostics from this hospitalization (including imaging, microbiology, ancillary and laboratory) are listed below for reference.   Imaging Studies: NM Pulmonary Perfusion  Result Date: 05/18/2022 CLINICAL DATA:  Concern for pulmonary embolus, cough and fever. EXAM: NUCLEAR MEDICINE PERFUSION LUNG SCAN TECHNIQUE: Perfusion images were obtained in multiple projections after intravenous injection of radiopharmaceutical. Ventilation scans intentionally deferred if perfusion scan and chest x-ray adequate for interpretation during COVID 19 epidemic. RADIOPHARMACEUTICALS:  4 mCi Tc-7mMAA  IV COMPARISON:  Chest radiograph May 17, 2022 and nuclear medicine lung scan October 08, 2012 FINDINGS: Segmental wedge-shaped perfusion defects in the left lower lung correspond with pleural effusion/consolidation in the left lower lobe and enlarged cardiac silhouette seen on prior day radiograph. Tiny mismatched wedge-shaped perfusion defects in the right lung. IMPRESSION: Pulmonary embolus absent by PISAPED . Electronically Signed   By: Dahlia Bailiff M.D.   On: 05/18/2022 13:28   VAS [place] LOWER EXTREMITY VENOUS (DVT) (7a-7p)  Result Date: 05/17/2022  Lower Venous DVT Study Patient Name:  EBBIE CHERRY  Date of Exam:   05/17/2022 Medical Rec #: [id number]      Accession #:     [id number] Date of Birth: [birth year]      Patient Gender: M Patient Age:   71 years Exam Location:  [facility] Procedure:      VAS [place] LOWER EXTREMITY VENOUS (DVT) Referring Phys: Joanette Gula --------------------------------------------------------------------------------  Indications: Swelling.  Comparison Study: No previous exam noted. Performing Technologist: Bobetta Lime BS, RVT  Examination Guidelines: A complete evaluation includes B-mode imaging, spectral Doppler, color Doppler, and power Doppler as needed of all accessible portions of each vessel. Bilateral testing is considered an integral part of a complete examination. Limited examinations for reoccurring indications may be performed as noted. The reflux portion of the exam is performed with the patient in reverse Trendelenburg.  +---------+---------------+---------+-----------+---------------+--------------+ RIGHT    CompressibilityPhasicitySpontaneityProperties     Thrombus Aging +---------+---------------+---------+-----------+---------------+--------------+ CFV      None           Yes      Yes                       Acute          +---------+---------------+---------+-----------+---------------+--------------+ SFJ      Full                                                             +---------+---------------+---------+-----------+---------------+--------------+ FV Prox  None                                                             +---------+---------------+---------+-----------+---------------+--------------+ FV Mid   None                                                             +---------+---------------+---------+-----------+---------------+--------------+ FV DistalNone                                                             +---------+---------------+---------+-----------+---------------+--------------+ PFV      None                                                              +---------+---------------+---------+-----------+---------------+--------------+  POP      None           Yes      Yes        partially                                                                 re-cannalized                 +---------+---------------+---------+-----------+---------------+--------------+ PTV      None                                                             +---------+---------------+---------+-----------+---------------+--------------+ PERO     None                                                             +---------+---------------+---------+-----------+---------------+--------------+ EIV      Full           Yes      Yes                                      +---------+---------------+---------+-----------+---------------+--------------+ The proximal common femoral vein is compressible with flow noted. Acute DVT of the mid and distal segments throughout the right lower extremity.  +---------+---------------+---------+-----------+----------+--------------+ LEFT     CompressibilityPhasicitySpontaneityPropertiesThrombus Aging +---------+---------------+---------+-----------+----------+--------------+ CFV      Full           Yes      Yes                                 +---------+---------------+---------+-----------+----------+--------------+ SFJ      Full                                                        +---------+---------------+---------+-----------+----------+--------------+ FV Prox  Full                                                        +---------+---------------+---------+-----------+----------+--------------+ FV Mid   Full                                                        +---------+---------------+---------+-----------+----------+--------------+ FV DistalFull                                                        +---------+---------------+---------+-----------+----------+--------------+  PFV       Full                                                        +---------+---------------+---------+-----------+----------+--------------+ POP      Full           Yes      Yes                                 +---------+---------------+---------+-----------+----------+--------------+ PTV      Full                                                        +---------+---------------+---------+-----------+----------+--------------+ PERO     Full                                                        +---------+---------------+---------+-----------+----------+--------------+ Wall thickening and diminutive vein size noted of the left lower extremity.    Summary: BILATERAL: -No evidence of popliteal cyst, bilaterally. RIGHT: - Findings consistent with acute deep vein thrombosis involving the right common femoral vein, right femoral vein, right proximal profunda vein, right popliteal vein, right posterior tibial veins, and right peroneal veins.  LEFT: - No evidence of deep vein thrombosis in the lower extremity. No indirect evidence of obstruction proximal to the inguinal ligament. - Wall thickening and diminutive vein size noted on the left.  *See table(s) above for measurements and observations. Electronically signed by Harold Barban MD on 05/17/2022 at 8:28:08 PM.    Final    DG Chest 2 View  Result Date: 05/17/2022 CLINICAL DATA:  Cough and fever x 2 days. EXAM: CHEST - 2 VIEW COMPARISON:  May 17, 2022 (12:27 p.m.) FINDINGS: There is a dual lead AICD. The cardiac silhouette is mildly enlarged and unchanged in size. There is mild calcification of the aortic arch. Low lung volumes are noted. Mild, stable atelectasis and/or infiltrate is seen within the left lung base. Very mild right basilar atelectasis is noted. There is no evidence of a pleural effusion or pneumothorax. The visualized skeletal structures are unremarkable. IMPRESSION: 1. Mild, stable left basilar atelectasis and/or  infiltrate. 2. Very mild right basilar atelectasis. Electronically Signed   By: Virgina Norfolk M.D.   On: 05/17/2022 18:23   ECHOCARDIOGRAM COMPLETE  Result Date: 05/17/2022    ECHOCARDIOGRAM REPORT   Patient Name:   BOLESLAW BORGHI Date of Exam: 05/17/2022 Medical Rec #:  [id number]     Height:       75.0 in Accession #:    [id number]    Weight:       170.0 lb Date of Birth:  1951-07-29     BSA:          2.048 m Patient Age:    78 years      BP:           135/87 mmHg Patient Gender: M  HR:           60 bpm. Exam Location:  Inpatient Procedure: 2D Echo, Cardiac Doppler and Color Doppler Indications:    Pulmonary Embolus I26.09  History:        Patient has prior history of Echocardiogram examinations, most                 recent 10/10/2012. CAD, Pacemaker, Stroke; Risk Factors:Former                 Smoker and Hypertension. CKD.  Sonographer:    Eartha Inch Referring Phys: [id number] PING T ZHANG  Sonographer Comments: No subcostal window. IMPRESSIONS  1. Basal inferior, inferolateral hypokinesis. The apical window is somewhat foreshortened. The apex appears hypertrophied with prominent trabeculations. Consider cardiac MRI to evaluate further.. Left ventricular ejection fraction, by estimation, is 55 to 60%. The left ventricle has normal function. Left ventricular diastolic parameters are consistent with Grade I diastolic dysfunction (impaired relaxation). Elevated left atrial pressure.  2. Right ventricular systolic function is normal. The right ventricular size is normal. There is normal pulmonary artery systolic pressure.  3. Right atrial size was mildly dilated.  4. Mild mitral valve regurgitation.  5. The tricuspid valve is mildly thickened.  6. The aortic valve is tricuspid. Aortic valve regurgitation is not visualized. Aortic valve sclerosis is present, with no evidence of aortic valve stenosis. FINDINGS  Left Ventricle: Basal inferior, inferolateral hypokinesis. The apical window is somewhat  foreshortened. The apex appears hypertrophied with prominent trabeculations. Consider cardiac MRI to evaluate further. Left ventricular ejection fraction, by estimation, is 55 to 60%. The left ventricle has normal function. The left ventricular internal cavity size was normal in size. Left ventricular diastolic parameters are consistent with Grade I diastolic dysfunction (impaired relaxation). Elevated left atrial pressure. Right Ventricle: The right ventricular size is normal. Right vetricular wall thickness was not assessed. Right ventricular systolic function is normal. There is normal pulmonary artery systolic pressure. The tricuspid regurgitant velocity is 2.21 m/s, and with an assumed right atrial pressure of 3 mmHg, the estimated right ventricular systolic pressure is 97.6 mmHg. Left Atrium: Left atrial size was normal in size. Right Atrium: Right atrial size was mildly dilated. Pericardium: Trivial pericardial effusion is present. Mitral Valve: There is mild thickening of the mitral valve leaflet(s). Mild mitral valve regurgitation. Tricuspid Valve: The tricuspid valve is mildly thickened. Tricuspid valve regurgitation is mild. Aortic Valve: The aortic valve is tricuspid. Aortic valve regurgitation is not visualized. Aortic valve sclerosis is present, with no evidence of aortic valve stenosis. Pulmonic Valve: The pulmonic valve was normal in structure. Pulmonic valve regurgitation is not visualized. Aorta: The aortic root and ascending aorta are structurally normal, with no evidence of dilitation. IAS/Shunts: No atrial level shunt detected by color flow Doppler. Additional Comments: A device lead is visualized.  LEFT VENTRICLE PLAX 2D LVIDd:         5.20 cm   Diastology LVIDs:         3.50 cm   LV e' medial:    2.70 cm/s LV PW:         1.10 cm   LV E/e' medial:  18.3 LV IVS:        1.00 cm   LV e' lateral:   5.40 cm/s LVOT diam:     2.30 cm   LV E/e' lateral: 9.1 LV SV:         96 LV SV Index:   47 LVOT  Area:  4.15 cm  RIGHT VENTRICLE RV S prime:     12.00 cm/s TAPSE (M-mode): 1.9 cm LEFT ATRIUM             Index        RIGHT ATRIUM           Index LA diam:        4.50 cm 2.20 cm/m   RA Area:     22.60 cm LA Vol (A2C):   53.7 ml 26.22 ml/m  RA Volume:   74.00 ml  36.13 ml/m LA Vol (A4C):   28.0 ml 13.67 ml/m LA Biplane Vol: 41.4 ml 20.22 ml/m  AORTIC VALVE LVOT Vmax:   94.20 cm/s LVOT Vmean:  67.200 cm/s LVOT VTI:    0.231 m  AORTA Ao Root diam: 3.90 cm Ao Asc diam:  3.30 cm MITRAL VALVE               TRICUSPID VALVE MV Area (PHT): 2.22 cm    TR Peak grad:   19.5 mmHg MV Decel Time: 342 msec    TR Vmax:        221.00 cm/s MR Peak grad: 20.1 mmHg MR Vmax:      224.00 cm/s  SHUNTS MV E velocity: 49.30 cm/s  Systemic VTI:  0.23 m MV A velocity: 90.70 cm/s  Systemic Diam: 2.30 cm MV E/A ratio:  0.54 Dorris Carnes MD Electronically signed by Dorris Carnes MD Signature Date/Time: 05/17/2022/4:23:57 PM    Final    DG Chest Port 1 View  Result Date: 05/17/2022 CLINICAL DATA:  Cough and fever over the last 2 days. EXAM: PORTABLE CHEST 1 VIEW COMPARISON:  10/13/2021 FINDINGS: Left lateral chest not included. Chronic cardiomegaly and aortic atherosclerosis. Pacemaker leads in the region of the right atrium and right ventricle. Right lung is clear. Left upper lobe is clear. Question left lower lobe pneumonia. Two-view chest radiography recommended when able. IMPRESSION: Question left lower lobe pneumonia. Recommend two-view chest radiography when able. Electronically Signed   By: Nelson Chimes M.D.   On: 05/17/2022 12:34   CUP PACEART INCLINIC DEVICE CHECK  Result Date: 04/25/2022 Pacemaker check in clinic. Normal device function. Thresholds, sensing, impedances consistent with previous measurements. Device programmed to maximize longevity. No mode switch or high ventricular rates noted. Device programmed at appropriate safety margins. Histogram distribution appropriate for patient activity level. Estimated longevity  5 yr, 2 mo. Patient enrolled in remote follow-up. Patient education completed.   Microbiology: Results for orders placed or performed during the hospital encounter of 05/17/22  SARS Coronavirus 2 by RT PCR (hospital order, performed in [facility] hospital lab) *cepheid single result test* Anterior Nasal Swab     Status: None   Collection Time: 05/17/22 12:19 PM   Specimen: Anterior Nasal Swab  Result Value Ref Range Status   SARS Coronavirus 2 by RT PCR NEGATIVE NEGATIVE Final    Comment: (NOTE) SARS-CoV-2 target nucleic acids are NOT DETECTED.  The SARS-CoV-2 RNA is generally detectable in upper and lower respiratory specimens during the acute phase of infection. The lowest concentration of SARS-CoV-2 viral copies this assay can detect is 250 copies / mL. A negative result does not preclude SARS-CoV-2 infection and should not be used as the sole basis for treatment or other patient management decisions.  A negative result may occur with improper specimen collection / handling, submission of specimen other than nasopharyngeal swab, presence of viral mutation(s) within the areas targeted by this assay, and inadequate number of  viral copies (<250 copies / mL). A negative result must be combined with clinical observations, patient history, and epidemiological information.  Fact Sheet for Patients:   https://www.patel.info/  Fact Sheet for Healthcare Providers: https://hall.com/  This test is not yet approved or  cleared by the [place] FDA and has been authorized for detection and/or diagnosis of SARS-CoV-2 by FDA under an Emergency Use Authorization (EUA).  This EUA will remain in effect (meaning this test can be used) for the duration of the COVID-19 declaration under Section 564(b)(1) of the Act, 21 U.S.C. section 360bbb-3(b)(1), unless the authorization is terminated or revoked sooner.  Performed at [facility], [place] [address], [place], [place] [address]   Blood culture (routine x 2)     Status: None (Preliminary result)   Collection Time: 05/17/22  1:16 PM   Specimen: BLOOD LEFT FOREARM  Result Value Ref Range Status   Specimen Description BLOOD LEFT FOREARM  Final   Special Requests   Final    BOTTLES DRAWN AEROBIC AND ANAEROBIC Blood Culture adequate volume   Culture   Final    NO GROWTH 2 DAYS Performed at [facility], [place] [address] [place], [place] [address]    Report Status PENDING  Incomplete  Blood culture (routine x 2)     Status: Abnormal   Collection Time: 05/17/22  1:20 PM   Specimen: BLOOD RIGHT WRIST  Result Value Ref Range Status   Specimen Description BLOOD RIGHT WRIST  Final   Special Requests   Final    BOTTLES DRAWN AEROBIC AND ANAEROBIC Blood Culture adequate volume   Culture  Setup Time   Final    GRAM POSITIVE COCCI IN CLUSTERS AEROBIC BOTTLE ONLY CRITICAL RESULT CALLED TO, READ BACK BY AND VERIFIED WITH: [place] [id number] AT 1027 BY EC    Culture (A)  Final    STAPHYLOCOCCUS CAPITIS THE SIGNIFICANCE OF ISOLATING THIS ORGANISM FROM A SINGLE SET OF BLOOD CULTURES WHEN MULTIPLE SETS ARE DRAWN IS UNCERTAIN. PLEASE NOTIFY THE MICROBIOLOGY DEPARTMENT WITHIN ONE WEEK IF SPECIATION AND SENSITIVITIES ARE REQUIRED. Performed at [facility], [place] [address], [place], [place] [address]    Report Status 05/19/2022 FINAL  Final  Blood Culture ID Panel (Reflexed)     Status: Abnormal   Collection Time: 05/17/22  1:20 PM  Result Value Ref Range Status   Enterococcus faecalis NOT DETECTED NOT DETECTED Final   Enterococcus Faecium NOT DETECTED NOT DETECTED Final   Listeria monocytogenes NOT DETECTED NOT DETECTED Final   Staphylococcus species DETECTED (A) NOT DETECTED Final    Comment: CRITICAL RESULT CALLED TO, READ BACK BY AND VERIFIED WITH: PHARMD SYDNEY DAVIS [id number] AT 5956 BY EC    Staphylococcus aureus (BCID) NOT DETECTED NOT DETECTED Final    Staphylococcus epidermidis NOT DETECTED NOT DETECTED Final   Staphylococcus lugdunensis NOT DETECTED NOT DETECTED Final   Streptococcus species NOT DETECTED NOT DETECTED Final   Streptococcus agalactiae NOT DETECTED NOT DETECTED Final   Streptococcus pneumoniae NOT DETECTED NOT DETECTED Final   Streptococcus pyogenes NOT DETECTED NOT DETECTED Final   A.calcoaceticus-baumannii NOT DETECTED NOT DETECTED Final   Bacteroides fragilis NOT DETECTED NOT DETECTED Final   Enterobacterales NOT DETECTED NOT DETECTED Final   Enterobacter cloacae complex NOT DETECTED NOT DETECTED Final   Escherichia coli NOT DETECTED NOT DETECTED Final   Klebsiella aerogenes NOT DETECTED NOT DETECTED Final   Klebsiella oxytoca NOT DETECTED NOT DETECTED Final   Klebsiella pneumoniae  NOT DETECTED NOT DETECTED Final   Proteus species NOT DETECTED NOT DETECTED Final   Salmonella species NOT DETECTED NOT DETECTED Final   Serratia marcescens NOT DETECTED NOT DETECTED Final   Haemophilus influenzae NOT DETECTED NOT DETECTED Final   Neisseria meningitidis NOT DETECTED NOT DETECTED Final   Pseudomonas aeruginosa NOT DETECTED NOT DETECTED Final   Stenotrophomonas maltophilia NOT DETECTED NOT DETECTED Final   Candida albicans NOT DETECTED NOT DETECTED Final   Candida auris NOT DETECTED NOT DETECTED Final   Candida glabrata NOT DETECTED NOT DETECTED Final   Candida krusei NOT DETECTED NOT DETECTED Final   Candida parapsilosis NOT DETECTED NOT DETECTED Final   Candida tropicalis NOT DETECTED NOT DETECTED Final   Cryptococcus neoformans/gattii NOT DETECTED NOT DETECTED Final    Comment: Performed at [facility], [place] [address], [place], [place] [address]    Labs: CBC: Recent Labs  Lab 05/17/22 1213 05/18/22 0307 05/19/22 0306  WBC 10.1 10.7* 8.3  NEUTROABS 6.9  --   --   HGB 9.9* 8.8* 8.1*  HCT 30.5* 27.1* 24.8*  MCV 90.5 90.6 90.8  PLT 206 198 625   Basic Metabolic Panel: Recent Labs  Lab 05/17/22 1213  05/18/22 0307 05/19/22 0306  NA 141 139 141  K 4.9 4.7 4.6  CL 110 113* 116*  CO2 24 20* 18*  GLUCOSE 120* 113* 93  BUN 77* 74* 69*  CREATININE 3.87* 3.35* 3.29*  CALCIUM 9.5 8.8* 8.8*   Liver Function Tests: Recent Labs  Lab 05/17/22 1213 05/19/22 0306  AST 13* 10*  ALT 16 14  ALKPHOS 72 61  BILITOT 0.5 0.3  PROT 6.1* 4.9*  ALBUMIN 3.1* 2.4*   CBG: No results for input(s): "GLUCAP" in the last 168 hours.  Discharge time spent: less than 30 minutes.  Signed: Marylu Lund, MD [facility] 05/19/2022

## 2022-05-19 NOTE — Discharge Instructions (Signed)
Information on my medicine - ELIQUIS (apixaban)  This medication education was reviewed with me or my healthcare representative as part of my discharge preparation.  The pharmacist that spoke with me during my hospital stay was:  Einar Grad, [facility]  Why was Eliquis prescribed for you? Eliquis was prescribed to treat blood clots that may have been found in the veins of your legs (deep vein thrombosis) or in your lungs (pulmonary embolism) and to reduce the risk of them occurring again.  What do You need to know about Eliquis ? The starting dose is 10 mg (two 5 mg tablets) taken TWICE daily for the FIRST SEVEN (7) DAYS, then on (enter date)  05/24/22  the dose is reduced to ONE 5 mg tablet taken TWICE daily.  Eliquis may be taken with or without food.   Try to take the dose about the same time in the morning and in the evening. If you have difficulty swallowing the tablet whole please discuss with your pharmacist how to take the medication safely.  Take Eliquis exactly as prescribed and DO NOT stop taking Eliquis without talking to the doctor who prescribed the medication.  Stopping may increase your risk of developing a new blood clot.  Refill your prescription before you run out.  After discharge, you should have regular check-up appointments with your healthcare provider that is prescribing your Eliquis.    What do you do if you miss a dose? If a dose of ELIQUIS is not taken at the scheduled time, take it as soon as possible on the same day and twice-daily administration should be resumed. The dose should not be doubled to make up for a missed dose.  Important Safety Information A possible side effect of Eliquis is bleeding. You should call your healthcare provider right away if you experience any of the following: Bleeding from an injury or your nose that does not stop. Unusual colored urine (red or dark [place]) or unusual colored stools (red or black). Unusual bruising for  unknown reasons. A serious fall or if you hit your head (even if there is no bleeding).  Some medicines may interact with Eliquis and might increase your risk of bleeding or clotting while on Eliquis. To help avoid this, consult your healthcare provider or pharmacist prior to using any new prescription or non-prescription medications, including herbals, vitamins, non-steroidal anti-inflammatory drugs (NSAIDs) and supplements.  This website has more information on Eliquis (apixaban): http://www.eliquis.com/eliquis/home

## 2022-05-19 NOTE — Plan of Care (Signed)
  Problem: Clinical Measurements: Goal: Will remain free from infection Outcome: Progressing Goal: Respiratory complications will improve Outcome: Progressing Goal: Cardiovascular complication will be avoided Outcome: Progressing   Problem: Health Behavior/Discharge Planning: Goal: Ability to manage health-related needs will improve Outcome: Not Progressing

## 2022-05-22 ENCOUNTER — Telehealth: Payer: Self-pay | Admitting: *Deleted

## 2022-05-22 ENCOUNTER — Telehealth: Payer: Self-pay

## 2022-05-22 DIAGNOSIS — I10 Essential (primary) hypertension: Secondary | ICD-10-CM

## 2022-05-22 LAB — CULTURE, BLOOD (ROUTINE X 2)
Culture: NO GROWTH
Special Requests: ADEQUATE

## 2022-05-22 NOTE — Patient Outreach (Signed)
  Care Coordination [facility] Note Transition Care Management Follow-up Telephone Call Date of discharge and from where: 05/19/22 [facility]  How have you been since you were released from the hospital? Wife states he is doing well Any questions or concerns? No  Items Reviewed: Did the pt receive and understand the discharge instructions provided? Yes  Medications obtained and verified? Yes  Other? No  Any new allergies since your discharge? No  Dietary orders reviewed? Yes Do you have support at home? Yes   Home Care and Equipment/Supplies: Were home health services ordered? no If so, what is the name of the agency? N/A  Has the agency set up a time to come to the patient's home? not applicable Were any new equipment or medical supplies ordered?  No What is the name of the medical supply agency? N/A Were you able to get the supplies/equipment? not applicable Do you have any questions related to the use of the equipment or supplies? No  Functional Questionnaire: (I = Independent and D = Dependent) ADLs: D  Bathing/Dressing- D  Meal Prep- D  Eating- D  Maintaining continence- D  Transferring/Ambulation- D  Managing Meds- D  Follow up appointments reviewed:  PCP Hospital f/u appt confirmed? Yes  Scheduled to see Dr. Margarita Rana on  06/07/22@ 1430. [facility] f/u appt confirmed? No   Are transportation arrangements needed? Yes  If their condition worsens, is the pt aware to call PCP or go to the Emergency Dept.? Yes Was the patient provided with contact information for the PCP's office or ED? Yes Was to pt encouraged to call back with questions or concerns? Yes  SDOH assessments and interventions completed:   Yes  Care Coordination Interventions Activated:  Yes   Care Coordination Interventions:  Transportation arranged    Encounter Outcome:  Pt. Visit Completed    Emelia Loron RN, BSN Protection [phone] [email or web address]

## 2022-05-22 NOTE — Telephone Encounter (Signed)
   Telephone encounter was:  Successful.  05/22/2022 Name: [name] MRN: [id number] DOB: [birth year]  Darrell [name] is a 71 y.o. year old male who is a primary care patient of [name], MD . The community resource team was consulted for assistance with Transportation Needs   Care guide performed the following interventions: Patient provided with information about care guide support team and interviewed to confirm resource needs. Patient has 2 appointment that need to be set up for transportation I will mail othe resources for transportation for future use Follow Up Plan:  Care guide will follow up with patient by phone over the next day    [place], Care Management  [phone] [address], [place], [place] [address] Phone: [phone] Email: [email or web address]

## 2022-05-23 ENCOUNTER — Telehealth: Payer: Self-pay

## 2022-05-23 NOTE — Telephone Encounter (Signed)
   Telephone encounter was:  Successful.  05/23/2022 Name: OCTAVIANO MUKAI MRN: [id number] DOB: 05/10/1951  MAKYE RADLE is a 71 y.o. year old male who is a primary care patient of [name], MD . The community resource team was consulted for assistance with Transportation Needs   Care guide performed the following interventions: Patient provided with information about care guide support team and interviewed to confirm resource needs.Completed transportation for 9/6 11:15 pick up time is 10:29 ride ID [id number] 9/13 PICK UP TIME IS 1:48 for his appointment at 2:30 Ride ID is [id number]  Follow Up Plan:  Care guide will follow up with patient by phone over the next day    [place], [facility]  [phone] [address], [place], [place] [address] Phone: [phone] Email: [email or web address]

## 2022-05-23 NOTE — Telephone Encounter (Signed)
[place], [facility], Care Management  [phone] [address], [place], [place] [address] Phone: [phone] Email: [email or web address]

## 2022-05-31 DIAGNOSIS — R1310 Dysphagia, unspecified: Secondary | ICD-10-CM | POA: Diagnosis not present

## 2022-05-31 DIAGNOSIS — Z7409 Other reduced mobility: Secondary | ICD-10-CM | POA: Diagnosis not present

## 2022-05-31 DIAGNOSIS — R252 Cramp and spasm: Secondary | ICD-10-CM | POA: Diagnosis not present

## 2022-05-31 DIAGNOSIS — R471 Dysarthria and anarthria: Secondary | ICD-10-CM | POA: Diagnosis not present

## 2022-05-31 DIAGNOSIS — Z7901 Long term (current) use of anticoagulants: Secondary | ICD-10-CM | POA: Diagnosis not present

## 2022-05-31 DIAGNOSIS — G1221 Amyotrophic lateral sclerosis: Secondary | ICD-10-CM | POA: Diagnosis not present

## 2022-05-31 DIAGNOSIS — G1223 Primary lateral sclerosis: Secondary | ICD-10-CM | POA: Diagnosis not present

## 2022-05-31 DIAGNOSIS — Z79899 Other long term (current) drug therapy: Secondary | ICD-10-CM | POA: Diagnosis not present

## 2022-06-06 ENCOUNTER — Other Ambulatory Visit: Payer: Medicare HMO | Admitting: Internal Medicine

## 2022-06-06 VITALS — BP 128/70 | HR 59 | Temp 97.7°F

## 2022-06-06 DIAGNOSIS — Z515 Encounter for palliative care: Secondary | ICD-10-CM

## 2022-06-06 DIAGNOSIS — G1221 Amyotrophic lateral sclerosis: Secondary | ICD-10-CM

## 2022-06-06 NOTE — Progress Notes (Unsigned)
[facility] Palliative Care Follow-Up Visit Telephone: [phone]  Fax: [phone]   Date of encounter: 06/06/22 7:03 AM PATIENT NAME: Darrell [name] [address] [place] [address]   [phone] (home)  DOB: [birth year] MRN: [id number] PRIMARY CARE PROVIDER:    [name], MD,  [place] [address] [phone]  REFERRING PROVIDER:   [name], MD [place],  [place] [address] [phone]  RESPONSIBLE PARTY:    Contact Information     Name Relation Home Work [place] Spouse [phone]  [phone]        I met face to face with patient and family in his home. Palliative Care was asked to follow this patient by consultation request of  [name], MD to address advance care planning and complex medical decision making. This is follow-up visit.                                     ASSESSMENT AND PLAN / RECOMMENDATIONS:   Advance Care Planning/Goals of Care: Goals include to maximize quality of life and symptom management. Patient/health care surrogate gave his/her permission to discuss.Our advance care planning conversation included a discussion about:    The value and importance of advance care planning  Experiences with loved ones who have been seriously ill or have died  Exploration of personal, cultural or spiritual beliefs that might influence medical decisions  Exploration of goals of care in the event of a sudden injury or illness  Identification  of a healthcare agent  Review and updating or creation of an  advance directive document . Decision not to resuscitate or to de-escalate disease focused treatments due to poor prognosis. CODE STATUS:  MOST:  DNR, limited additional interventions, abx, limited IVF, no feeding tube.    Symptom Management/Plan: 1. Amyotrophic lateral sclerosis/progressive muscular atrophy ([place]) -continues to  progress -no clear weight loss recently per his wife -continue in-home support and 24x7 care by his wife  2. Palliative care encounter -they were not ready for hospice care just yet -she will call back when ready -MOST reviewed and no changes desired--pt was not wanting to go back to hospital but his wife says he changes his mind when something happens   Follow up Palliative Care Visit: Palliative care will continue to follow for complex medical decision making, advance care planning, and clarification of goals.     This visit was coded based on medical decision making (MDM).   PPS: 40%  HOSPICE ELIGIBILITY/DIAGNOSIS: TBD  Chief Complaint: Follow-up palliative visit  HISTORY OF PRESENT ILLNESS:  Darrell [name] is a 71 y.o. year old male  with PLS, prior stroke, prior alcohol abuse, seizures .   03/2020 pt seen by NP [place] with [place] and had PPS 30%, bedbound and total care.   Social worker has spoken with them since--he was being followed in [place] [facility] through [place] but he'd not been during 04/06/22 call.  RN and SW did dual visit on 7/18 to pt's home.  Was eating smaller portions at three meals per day.  Pureed diet with NTL except water and drinking well.  Legally blind.  Occasional forgetfulness.  No sob.  Soft speech.  Regular loose bowels.  Transportation was being arranged thru als clinic for him and he was seen there on 9/6.  There it was  noted that he'd had accelerated decline with some complications and hospitalizations in recent months including one for DVTs for which he is on eliquis.  He was continued on keppra for seizures, a tilt-in-space wheelchair was recommended by OT there, and visit with urology for urinary retention with f/u in [place] clinic in 4 months.  When seen today, he remains bedbound and total care.  He has a bit more dysphagia with some coughing spells.  He is less inclined to eat as much.  Primarily listens to sports on TV which he enjoys.  His wife is his primary  caregiver.  History obtained from review of EMR, discussion with primary team, and interview with family, facility staff/caregiver and/or Mr. Kerlin.   I reviewed available labs, medications, imaging, studies and related documents from the EMR.  Records reviewed and summarized above.   ROS Review of Systemssee hpi  Physical Exam: Vitals:   06/06/22 0702  BP: 128/70  Pulse: (!) 59  Temp: 97.7 F (36.5 C)  SpO2: 93%   There is no height or weight on file to calculate BMI. Wt Readings from Last 500 Encounters:  05/17/22 170 lb 10.2 oz (77.4 kg)  04/25/22 179 lb (81.2 kg)  03/23/22 170 lb 13.7 oz (77.5 kg)  10/11/21 171 lb 4.8 oz (77.7 kg)  04/13/21 180 lb (81.6 kg)  03/02/21 166 lb (75.3 kg)  01/17/21 170 lb (77.1 kg)  07/22/19 165 lb (74.8 kg)  05/20/19 162 lb (73.5 kg)  05/08/19 167 lb 1.7 oz (75.8 kg)  03/17/19 150 lb (68 kg)  03/14/19 150 lb (68 kg)  07/24/18 170 lb (77.1 kg)  07/16/18 171 lb (77.6 kg)  07/16/18 170 lb (77.1 kg)  07/14/18 170 lb (77.1 kg)  06/13/18 162 lb 12.8 oz (73.8 kg)  03/07/18 174 lb 9.6 oz (79.2 kg)  01/15/18 172 lb (78 kg)  12/05/17 180 lb (81.6 kg)  09/13/17 167 lb (75.8 kg)  09/07/17 167 lb 12.8 oz (76.1 kg)  08/23/17 166 lb 1.6 oz (75.3 kg)  08/15/17 167 lb 12.8 oz (76.1 kg)  08/01/17 168 lb (76.2 kg)  07/23/17 170 lb (77.1 kg)  07/20/17 170 lb (77.1 kg)  07/19/17 170 lb (77.1 kg)  07/21/17 180 lb (81.6 kg)  07/07/17 180 lb (81.6 kg)  04/17/17 180 lb (81.6 kg)  02/20/17 175 lb 9.6 oz (79.7 kg)  01/29/17 166 lb 12.8 oz (75.7 kg)  01/17/17 169 lb 14.4 oz (77.1 kg)  01/15/17 172 lb 6.4 oz (78.2 kg)  12/06/16 194 lb (88 kg)  09/12/16 170 lb (77.1 kg)  07/21/16 194 lb (88 kg)  11/25/15 185 lb (83.9 kg)  08/16/15 190 lb (86.2 kg)  02/24/15 192 lb 3.2 oz (87.2 kg)  11/23/14 181 lb 9 oz (82.4 kg)  11/19/14 181 lb 9 oz (82.4 kg)  02/25/14 172 lb 6.4 oz (78.2 kg)  06/20/13 175 lb (79.4 kg)  06/05/13 175 lb (79.4 kg)  04/03/13 176 lb  (79.8 kg)  03/25/13 175 lb (79.4 kg)  10/10/12 172 lb 13.5 oz (78.4 kg)   Physical Exam Eyes:     Comments: Legally blind  Cardiovascular:     Rate and Rhythm: Normal rate and regular rhythm.  Pulmonary:     Effort: Pulmonary effort is normal.     Breath sounds: Normal breath sounds. No rhonchi.  Abdominal:     General: Bowel sounds are normal.     Palpations: Abdomen is soft.  Genitourinary:    Comments: Catheter in  place for retention  Musculoskeletal:     Right lower leg: No edema.     Left lower leg: No edema.  Skin:    General: Skin is warm and dry.  Neurological:     Mental Status: He is alert.     Motor: Weakness present.     CURRENT PROBLEM LIST:  Patient Active Problem List   Diagnosis Date Noted   DVT (deep venous thrombosis) ([place]) 05/17/2022   PNA (pneumonia) 05/17/2022   DNR (do not resuscitate) 04/13/2022   Community acquired bacterial pneumonia 03/23/2022   Acute respiratory failure with hypoxia ([place]) 10/10/2021   Coffee ground emesis 10/07/2021   Hyperkalemia 10/07/2021   Normocytic anemia 10/07/2021   Bigeminal rhythm 10/07/2021   Fecal impaction ([name])    Acute metabolic encephalopathy 22/97/9892   COVID-19 virus infection 05/06/2021   Anemia, chronic disease 05/06/2021   Benign prostatic hyperplasia 05/06/2021   Ulcer of ankle ([place]) 03/29/2021   CKD (chronic kidney disease), stage IV ([name]) 03/09/2021   Cognitive communication deficit 01/10/2021   Chronic bronchitis ([place]) 05/25/2020   Chronic cough 05/25/2020   Goals of care, counseling/discussion    Palliative care by specialist    ARF (acute renal failure) ([place]) 05/05/2019   Pain due to onychomycosis of toenails of both feet 03/25/2019   Primary lateral sclerosis ([place]) 08/28/2018   ALS (amyotrophic lateral sclerosis) ([place]) 07/24/2018   Dysarthria 05/21/2018   Dysphagia 05/21/2018   Gait abnormality 05/21/2018   Spasticity 05/21/2018   GERD (gastroesophageal reflux disease) 07/21/2017    Mixed hyperlipidemia 07/21/2017   Bradycardia    Seizure disorder ([place]) 07/08/2017   Status cardiac pacemaker 01/29/2017   Unintended weight loss 01/29/2017   Tinea pedis of right foot 01/29/2017   Hypertensive retinopathy of both eyes 01/16/2017   Hypercalcemia 01/15/2017   Heart block 01/15/2017   Nuclear sclerosis of both eyes 10/25/2016   Primary open angle glaucoma of both eyes, indeterminate stage 10/25/2016   Glaucoma 09/12/2016   Liver hemangioma 08/14/2016   Renal cyst 08/14/2016   Renal mass, right 08/14/2016   Elevated liver enzymes 08/10/2016   Chronic kidney disease, stage 3b ([place]) 08/16/2015   At high risk for falls 08/16/2015   Subcutaneous nodules 08/16/2015   Prediabetes 10/07/2013   Inguinal hernia 03/25/2013   History of CVA with residual deficit 03/25/2013   Essential hypertension 10/09/2012   Tobacco abuse 10/09/2012    PAST MEDICAL HISTORY:  Active Ambulatory Problems    Diagnosis Date Noted   Essential hypertension 10/09/2012   Tobacco abuse 10/09/2012   Inguinal hernia 03/25/2013   History of CVA with residual deficit 03/25/2013   Prediabetes 10/07/2013   Chronic kidney disease, stage 3b ([place]) 08/16/2015   At high risk for falls 08/16/2015   Subcutaneous nodules 08/16/2015   Elevated liver enzymes 08/10/2016   Liver hemangioma 08/14/2016   Renal cyst 08/14/2016   Renal mass, right 08/14/2016   Glaucoma 09/12/2016   Hypercalcemia 01/15/2017   Heart block 01/15/2017   Status cardiac pacemaker 01/29/2017   Unintended weight loss 01/29/2017   Tinea pedis of right foot 01/29/2017   Seizure disorder ([place]) 07/08/2017   Bradycardia    Hypertensive retinopathy of both eyes 01/16/2017   Nuclear sclerosis of both eyes 10/25/2016   Primary open angle glaucoma of both eyes, indeterminate stage 10/25/2016   GERD (gastroesophageal reflux disease) 07/21/2017   Mixed hyperlipidemia 07/21/2017   Dysarthria 05/21/2018   Dysphagia 05/21/2018   Gait  abnormality 05/21/2018   Spasticity 05/21/2018  ALS (amyotrophic lateral sclerosis) ([place]) 07/24/2018   Pain due to onychomycosis of toenails of both feet 03/25/2019   ARF (acute renal failure) ([place]) 05/05/2019   Goals of care, counseling/discussion    Palliative care by specialist    Chronic bronchitis ([place]) 05/25/2020   Chronic cough 05/25/2020   Cognitive communication deficit 01/10/2021   Primary lateral sclerosis ([place]) 08/28/2018   CKD (chronic kidney disease), stage IV ([place]) 03/09/2021   Ulcer of ankle ([place]) 03/47/4259   Acute metabolic encephalopathy 56/38/7564   COVID-19 virus infection 05/06/2021   Anemia, chronic disease 05/06/2021   Benign prostatic hyperplasia 05/06/2021   Coffee ground emesis 10/07/2021   Hyperkalemia 10/07/2021   Normocytic anemia 10/07/2021   Fecal impaction ([place])    Bigeminal rhythm 10/07/2021   Acute respiratory failure with hypoxia ([place]) 10/10/2021   Community acquired bacterial pneumonia 03/23/2022   DNR (do not resuscitate) 04/13/2022   DVT (deep venous thrombosis) ([place]) 05/17/2022   PNA (pneumonia) 05/17/2022   Resolved Ambulatory Problems    Diagnosis Date Noted   Syncope and collapse 10/09/2012   Rash 10/09/2012   CIDP (chronic inflammatory demyelinating polyneuropathy) ([name])    Past Medical History:  Diagnosis Date   Arthritis    Asthma    CKD (chronic kidney disease) stage 3, GFR 30-59 ml/min ([place]) 08/16/2015   Dysphagia as late effect of cerebrovascular disease    High cholesterol    Hypertension    New onset seizure ([place]) 07/08/2017   Pneumonia    Presence of permanent cardiac pacemaker    Stroke ([place])    Tobacco dependence     SOCIAL HX:  Social History   Tobacco Use   Smoking status: Every Day    Packs/day: 1.00    Years: 40.00    Total pack years: 40.00    Types: Cigarettes   Smokeless tobacco: Never   Tobacco comments:    12/13/20 5 a day  Substance Use Topics   Alcohol use: No    Comment: alcohol free for 1  year was drinking 1/2 pint per day      ALLERGIES:  Allergies  Allergen Reactions   Ace Inhibitors Other (See Comments)    Hyperkalemia   Doxycycline Other (See Comments)    Hiccups, cough, nausea and emesis, elevated liver enzymes, elevated eosinophils, SOB concerning for early DRESS syndrome    Atacand Hct [Candesartan Cilexetil-Hctz] Hives   Shellfish Allergy Hives      PERTINENT MEDICATIONS:  Outpatient Encounter Medications as of 06/06/2022  Medication Sig   acetaminophen (TYLENOL) 325 MG tablet Take 325 mg by mouth daily as needed for mild pain or moderate pain.   albuterol (VENTOLIN HFA) 108 (90 Base) MCG/ACT inhaler Inhale 2 puffs into the lungs every 6 (six) hours as needed for up to 30 days for Wheezing.   APIXABAN (ELIQUIS) VTE STARTER PACK (10MG AND 5MG) Take as directed on package: start with two-54m tablets twice daily for 7 days. On day 8, switch to one-533mtablet twice daily.   azithromycin (ZITHROMAX) 500 MG tablet Take 1 tablet (500 mg total) by mouth See admin instructions. Take 1 tablet by mouth Monday,Wednesday and Friday   brimonidine (ALPHAGAN) 0.2 % ophthalmic solution Place 1 drop into both eyes daily.   cetirizine (ZYRTEC) 10 MG tablet Take 1 tablet (10 mg total) by mouth daily.   dicyclomine (BENTYL) 10 MG capsule Take 10 mg by mouth daily as needed (pain).   dorzolamide-timolol (COSOPT) 22.3-6.8 MG/ML ophthalmic solution Place 1 drop into  both eyes daily in the afternoon.   ferrous sulfate (FEROSUL) 325 (65 FE) MG tablet Take 1 tablet (325 mg total) by mouth 2 (two) times daily with a meal.   finasteride (PROSCAR) 5 MG tablet Take 5 mg by mouth daily.   fluticasone (FLONASE) 50 MCG/ACT nasal spray Place 1 spray into both nostrils daily.   fluticasone furoate-vilanterol (BREO ELLIPTA) 200-25 MCG/ACT AEPB Inhale 2 puffs into the lungs daily.   guaiFENesin (MUCINEX) 600 MG 12 hr tablet Take 600 mg by mouth 2 (two) times daily.   hydrALAZINE (APRESOLINE) 25 MG  tablet Take 1 tablet (25 mg total) by mouth 3 (three) times daily.   latanoprost (XALATAN) 0.005 % ophthalmic solution Place 1 drop into both eyes at bedtime.   levETIRAcetam (KEPPRA) 500 MG tablet Take 1 tablet (500 mg total) by mouth 2 (two) times daily.   metoprolol succinate (TOPROL-XL) 50 MG 24 hr tablet Take 1 tablet (50 mg total) by mouth at bedtime. Take with or immediately following a meal.   montelukast (SINGULAIR) 10 MG tablet Take 10 mg by mouth at bedtime.   Multiple Vitamin (MULTI-VITAMIN) tablet Take 1 tablet by mouth daily.   ondansetron (ZOFRAN) 4 MG tablet Take 1 tablet (4 mg total) by mouth every 8 (eight) hours as needed for nausea or vomiting.   polyethylene glycol (MIRALAX) 17 g packet Take 17 g by mouth daily.   tamsulosin (FLOMAX) 0.4 MG CAPS capsule Take 1 capsule (0.4 mg total) by mouth daily.   torsemide (DEMADEX) 20 MG tablet Take 20 mg by mouth daily as needed (fluid).   [DISCONTINUED] amLODipine (NORVASC) 10 MG tablet Take 1 tablet by mouth once daily   [DISCONTINUED] atorvastatin (LIPITOR) 40 MG tablet Take 1 tablet (40 mg total) by mouth daily.   [DISCONTINUED] glycopyrrolate (ROBINUL) 1 MG tablet Take 1 tablet (1 mg total) by mouth 2 times daily.   No facility-administered encounter medications on file as of 06/06/2022.    Thank you for the opportunity to participate in the care of Mr. Cherian.  The palliative care team will continue to follow. Please call our office at [phone] if we can be of additional assistance.   Hollace Kinnier, DO  COVID-19 PATIENT SCREENING TOOL Asked and negative response unless otherwise noted:  Have you had symptoms of covid, tested positive or been in contact with someone with symptoms/positive test in the past 5-10 days? No

## 2022-06-07 ENCOUNTER — Telehealth: Payer: Self-pay

## 2022-06-07 ENCOUNTER — Ambulatory Visit: Payer: Medicare HMO | Attending: Family Medicine | Admitting: Family Medicine

## 2022-06-07 VITALS — BP 153/81 | HR 59

## 2022-06-07 DIAGNOSIS — I82402 Acute embolism and thrombosis of unspecified deep veins of left lower extremity: Secondary | ICD-10-CM | POA: Diagnosis not present

## 2022-06-07 DIAGNOSIS — I69354 Hemiplegia and hemiparesis following cerebral infarction affecting left non-dominant side: Secondary | ICD-10-CM

## 2022-06-07 DIAGNOSIS — R399 Unspecified symptoms and signs involving the genitourinary system: Secondary | ICD-10-CM | POA: Diagnosis not present

## 2022-06-07 DIAGNOSIS — R059 Cough, unspecified: Secondary | ICD-10-CM | POA: Diagnosis not present

## 2022-06-07 DIAGNOSIS — R339 Retention of urine, unspecified: Secondary | ICD-10-CM

## 2022-06-07 DIAGNOSIS — G1223 Primary lateral sclerosis: Secondary | ICD-10-CM

## 2022-06-07 DIAGNOSIS — I10 Essential (primary) hypertension: Secondary | ICD-10-CM

## 2022-06-07 DIAGNOSIS — Z23 Encounter for immunization: Secondary | ICD-10-CM | POA: Diagnosis not present

## 2022-06-07 MED ORDER — ATORVASTATIN CALCIUM 40 MG PO TABS: 40.0000 mg | ORAL_TABLET | Freq: Every day | ORAL | 1 refills | Status: DC

## 2022-06-07 MED ORDER — APIXABAN 5 MG PO TABS: 5.0000 mg | ORAL_TABLET | Freq: Two times a day (BID) | ORAL | 1 refills | Status: DC

## 2022-06-07 MED ORDER — GLYCOPYRROLATE 1 MG PO TABS: ORAL_TABLET | ORAL | 5 refills | Status: DC

## 2022-06-07 MED ORDER — CEPHALEXIN 500 MG PO CAPS: 500.0000 mg | ORAL_CAPSULE | Freq: Four times a day (QID) | ORAL | 0 refills | Status: DC

## 2022-06-07 MED ORDER — AMLODIPINE BESYLATE 10 MG PO TABS: 10.0000 mg | ORAL_TABLET | Freq: Every day | ORAL | 1 refills | Status: DC

## 2022-06-07 NOTE — Telephone Encounter (Signed)
I met with the patient and his wife when they were in the clinic today. They explained that they have not heard from a home health agency since he has been home from the hospital. They are particularly interested in home ST.  I contacted [facility] who had been following the patient prior to his hospitalization and spoke to [place] the clinical manager who said they did not receive  resumption of care orders. She said that she would reach out to [facility]	[place] and check on the status of the referral.  I then received a call from [employer] who explained that [employer] is not able to provide ST for the patient so she has referred him to [facility].  She stated that [facility] should be seeing him in the next 24-48 hours. Angie sent the home health orders to [facility] and said she would contact the patient / his wife and apologize for the delay in services and inform them that [facility] will be contacting them to initiate services   Patient's wife is interested in purchasing " simply thick" for patient's drinks.  She said he doesn't even taste it and she is willing to pay out of pocket for it.  I told her that I would check local stores for it but she can also discuss this with the ST that comes to the house.  He is in need of a new wheelchair and his wife said that the ALS clinic is trying to help arrange this.

## 2022-06-07 NOTE — Patient Instructions (Signed)
Diarrhea, Adult Diarrhea is frequent loose and watery bowel movements. Diarrhea can make you feel weak and cause you to become dehydrated. Dehydration can make you tired and thirsty, cause you to have a dry mouth, and decrease how often you urinate. Diarrhea typically lasts 2-3 days. However, it can last longer if it is a sign of something more serious. It is important to treat your diarrhea as told by your health care provider. Follow these instructions at home: Eating and drinking     Follow these recommendations as told by your health care provider: Take an oral rehydration solution (ORS). This is an over-the-counter medicine that helps return your body to its normal balance of nutrients and water. It is found at pharmacies and retail stores. Drink plenty of fluids, such as water, ice chips, diluted fruit juice, and low-calorie sports drinks. You can drink milk also, if desired. Avoid drinking fluids that contain a lot of sugar or caffeine, such as energy drinks, sports drinks, and soda. Eat bland, easy-to-digest foods in small amounts as you are able. These foods include bananas, applesauce, rice, lean meats, toast, and crackers. Avoid alcohol. Avoid spicy or fatty foods.  Medicines Take over-the-counter and prescription medicines only as told by your health care provider. If you were prescribed an antibiotic medicine, take it as told by your health care provider. Do not stop using the antibiotic even if you start to feel better. General instructions  Wash your hands often using soap and water. If soap and water are not available, use a hand sanitizer. Others in the household should wash their hands as well. Hands should be washed: After using the toilet or changing a diaper. Before preparing, cooking, or serving food. While caring for a sick person or while visiting someone in a hospital. Drink enough fluid to keep your urine pale yellow. Rest at home while you recover. Watch your  condition for any changes. Take a warm bath to relieve any burning or pain from frequent diarrhea episodes. Keep all follow-up visits as told by your health care provider. This is important. Contact a health care provider if: You have a fever. Your diarrhea gets worse. You have new symptoms. You cannot keep fluids down. You feel light-headed or dizzy. You have a headache. You have muscle cramps. Get help right away if: You have chest pain. You feel extremely weak or you faint. You have bloody or black stools or stools that look like tar. You have severe pain, cramping, or bloating in your abdomen. You have trouble breathing or you are breathing very quickly. Your heart is beating very quickly. Your skin feels cold and clammy. You feel confused. You have signs of dehydration, such as: Dark urine, very little urine, or no urine. Cracked lips. Dry mouth. Sunken eyes. Sleepiness. Weakness. Summary Diarrhea is frequent loose and sometimes watery bowel movements. Diarrhea can make you feel weak and cause you to become dehydrated. Drink enough fluids to keep your urine pale yellow. Make sure that you wash your hands after using the toilet. If soap and water are not available, use hand sanitizer. Contact a health care provider if your diarrhea gets worse or you have new symptoms. Get help right away if you have signs of dehydration. This information is not intended to replace advice given to you by your health care provider. Make sure you discuss any questions you have with your health care provider. Document Revised: 12/02/2021 Document Reviewed: 03/23/2021 Elsevier Patient Education  2023 Elsevier Inc.  

## 2022-06-07 NOTE — Progress Notes (Signed)
Subjective:  Patient ID: Darrell [name], male    DOB: 1951/07/31  Age: 71 y.o. MRN: [id number]  CC: Hypertension   HPI Darrell [name] is a 71 y.o. year old male with a history of hypertension, previous CVA with residual left-sided weakness, seizure in the setting of prior CVA, second degree AV block (status post medtronic dual chamber pacemaker) stage III CKD, motor neuron disease/ALS, urinary retention.  Hospitalized in 04/2022 for new diagnosis of right lower extremity DVT for which he was placed on Eliquis.   LE Doppler revealed: Summary:  BILATERAL:  -No evidence of popliteal cyst, bilaterally.  RIGHT:  - Findings consistent with acute deep vein thrombosis involving the right  common femoral vein, right femoral vein, right proximal profunda vein,  right popliteal vein, right posterior tibial veins, and right peroneal  veins.     LEFT:  - No evidence of deep vein thrombosis in the lower extremity. No indirect  evidence of obstruction proximal to the inguinal ligament.  - Wall thickening and diminutive vein size noted on the left.       During his hospitalization he was also managed for community-acquired pneumonia. Accompanied by spouse who provides most of the history.  Interval History:  Spouse states he is doing better and is adherent with his Eliquis.  His cough has resolved. He is refusing to drink if his drink has a thickener.  She denies him choking. Last week he did have a visit with his ALS physician Dr. Tillman Abide at [facility].  He does have blood in his urine in the bag of the Foley catheter.  Per spouse, foley catheter was changed by the home health nurse last week. Spouse also states he has had some discharge from his penis as well but he has been afebrile and his appetite is normal. He is currently under the care of urology. Past Medical History:  Diagnosis Date   Arthritis    Asthma    At high risk for falls 08/16/2015   CIDP (chronic  inflammatory demyelinating polyneuropathy) ([name])    CKD (chronic kidney disease) stage 3, GFR 30-59 ml/min ([name]) 08/16/2015   Dysphagia as late effect of cerebrovascular disease    pts wife states pt has to eat soft foods    Elevated liver enzymes 08/10/2016   GERD (gastroesophageal reflux disease)    Glaucoma    High cholesterol    History of CVA with residual deficit 03/25/2013   Hypertension    Hypertensive retinopathy of both eyes 01/16/2017   Inguinal hernia 03/25/2013   Liver hemangioma 08/14/2016   New onset seizure ([place]) 07/08/2017   seizure 07/14/18   Nuclear sclerosis of both eyes 10/25/2016   Pneumonia    Presence of permanent cardiac pacemaker    Primary open angle glaucoma of both eyes, indeterminate stage 10/25/2016   Renal mass, right 08/14/2016   Status cardiac pacemaker 01/29/2017   Placed for second degree heart block on 01/16/17 Medtronic Azure XT DR MRI SureScan dual-chamber pacemaker   Stroke [facility])    2011 with residual deficit left sided weakness   Tobacco dependence     Past Surgical History:  Procedure Laterality Date   EYE SURGERY     HERNIA REPAIR     INGUINAL HERNIA REPAIR Right 11/23/2014   Procedure: right inguinal hernia repair with mesh;  Surgeon: Armandina Gemma, MD;  Location: [facility];  Service: General;  Laterality: Right;   INSERTION OF MESH N/A 11/23/2014   Procedure: INSERTION OF  MESH;  Surgeon: Armandina Gemma, MD;  Location: [facility];  Service: General;  Laterality: N/A;   JOINT REPLACEMENT     MASS EXCISION Left 08/29/2017   Procedure: EXCISION OF LEFT NECK MASS;  Surgeon: Coralie Keens, MD;  Location: [facility];  Service: General;  Laterality: Left;   PACEMAKER IMPLANT N/A 01/16/2017   Procedure: Pacemaker Implant;  Surgeon: Will Meredith Leeds, MD;  Location: [facility];  Service: Cardiovascular;  Laterality: N/A;   SHOULDER SURGERY Bilateral 1988, 1998    Family History  Problem Relation Age of Onset   Hypertension Mother     Diabetes Sister    Hypertension Sister    Cancer Sister        1 sister   COPD Sister        in 1 sister   Stomach cancer Neg Hx    Colon cancer Neg Hx    Pancreatic cancer Neg Hx    Esophageal cancer Neg Hx     Social History   Socioeconomic History   Marital status: Married    Spouse name: [name]   Number of children: 1   Years of education: college   Highest education level: Not on file  Occupational History   Occupation: retired  Tobacco Use   Smoking status: Every Day    Packs/day: 1.00    Years: 40.00    Total pack years: 40.00    Types: Cigarettes   Smokeless tobacco: Never   Tobacco comments:    12/13/20 5 a day  Vaping Use   Vaping Use: Never used  Substance and Sexual Activity   Alcohol use: No    Comment: alcohol free for 1 year was drinking 1/2 pint per day    Drug use: No   Sexual activity: Not on file  Other Topics Concern   Not on file  Social History Narrative   09/13/17 Patient lives at home with spouse.   Has 67 yo daughter   No grandchildren   Social Determinants of Radio broadcast assistant Strain: Not on file  Food Insecurity: Not on file  Transportation Needs: No Transportation Needs (04/21/2022)   PRAPARE - Hydrologist (Medical): No    Lack of Transportation (Non-Medical): No  Physical Activity: Not on file  Stress: Not on file  Social Connections: Not on file    Allergies  Allergen Reactions   Ace Inhibitors Other (See Comments)    Hyperkalemia   Doxycycline Other (See Comments)    Hiccups, cough, nausea and emesis, elevated liver enzymes, elevated eosinophils, SOB concerning for early DRESS syndrome    Atacand Hct [Candesartan Cilexetil-Hctz] Hives   Shellfish Allergy Hives    Outpatient Medications Prior to Visit  Medication Sig Dispense Refill   acetaminophen (TYLENOL) 325 MG tablet Take 325 mg by mouth daily as needed for mild pain or moderate pain.     albuterol (VENTOLIN HFA) 108 (90  Base) MCG/ACT inhaler Inhale 2 puffs into the lungs every 6 (six) hours as needed for up to 30 days for Wheezing. 18 g 5   APIXABAN (ELIQUIS) VTE STARTER PACK ('10MG'$  AND '5MG'$ ) Take as directed on package: start with two-'5mg'$  tablets twice daily for 7 days. On day 8, switch to one-'5mg'$  tablet twice daily. 74 each 0   azithromycin (ZITHROMAX) 500 MG tablet Take 1 tablet (500 mg total) by mouth See admin instructions. Take 1 tablet by mouth Monday,Wednesday and Friday 12 tablet 2   brimonidine (  ALPHAGAN) 0.2 % ophthalmic solution Place 1 drop into both eyes daily.     cetirizine (ZYRTEC) 10 MG tablet Take 1 tablet (10 mg total) by mouth daily. 90 tablet 1   dicyclomine (BENTYL) 10 MG capsule Take 10 mg by mouth daily as needed (pain).     dorzolamide-timolol (COSOPT) 22.3-6.8 MG/ML ophthalmic solution Place 1 drop into both eyes daily in the afternoon.     ferrous sulfate (FEROSUL) 325 (65 FE) MG tablet Take 1 tablet (325 mg total) by mouth 2 (two) times daily with a meal. 60 tablet 2   finasteride (PROSCAR) 5 MG tablet Take 5 mg by mouth daily.     fluticasone (FLONASE) 50 MCG/ACT nasal spray Place 1 spray into both nostrils daily.     fluticasone furoate-vilanterol (BREO ELLIPTA) 200-25 MCG/ACT AEPB Inhale 2 puffs into the lungs daily.     guaiFENesin (MUCINEX) 600 MG 12 hr tablet Take 600 mg by mouth 2 (two) times daily.     hydrALAZINE (APRESOLINE) 25 MG tablet Take 1 tablet (25 mg total) by mouth 3 (three) times daily. 180 tablet 3   latanoprost (XALATAN) 0.005 % ophthalmic solution Place 1 drop into both eyes at bedtime.     levETIRAcetam (KEPPRA) 500 MG tablet Take 1 tablet (500 mg total) by mouth 2 (two) times daily. 180 tablet 4   metoprolol succinate (TOPROL-XL) 50 MG 24 hr tablet Take 1 tablet (50 mg total) by mouth at bedtime. Take with or immediately following a meal. 30 tablet 3   montelukast (SINGULAIR) 10 MG tablet Take 10 mg by mouth at bedtime.     Multiple Vitamin (MULTI-VITAMIN) tablet  Take 1 tablet by mouth daily.     ondansetron (ZOFRAN) 4 MG tablet Take 1 tablet (4 mg total) by mouth every 8 (eight) hours as needed for nausea or vomiting. 20 tablet 0   polyethylene glycol (MIRALAX) 17 g packet Take 17 g by mouth daily. 14 each 0   tamsulosin (FLOMAX) 0.4 MG CAPS capsule Take 1 capsule (0.4 mg total) by mouth daily. 90 capsule 1   torsemide (DEMADEX) 20 MG tablet Take 20 mg by mouth daily as needed (fluid).     amLODipine (NORVASC) 10 MG tablet Take 1 tablet by mouth once daily 90 tablet 0   atorvastatin (LIPITOR) 40 MG tablet Take 1 tablet (40 mg total) by mouth daily. 90 tablet 1   glycopyrrolate (ROBINUL) 1 MG tablet Take 1 tablet (1 mg total) by mouth 2 times daily. 60 tablet 5   No facility-administered medications prior to visit.     ROS Review of Systems  Constitutional:  Negative for activity change and appetite change.  HENT:  Negative for sinus pressure and sore throat.   Respiratory:  Negative for chest tightness, shortness of breath and wheezing.   Cardiovascular:  Negative for chest pain and palpitations.  Gastrointestinal:  Positive for diarrhea. Negative for abdominal distention, abdominal pain and constipation.  Genitourinary:  Positive for hematuria.  Musculoskeletal: Negative.   Psychiatric/Behavioral:  Negative for behavioral problems and dysphoric mood.     Objective:  BP (!) 153/81   Pulse (!) 59   SpO2 98%      06/07/2022    2:25 PM 05/19/2022    8:42 PM 05/19/2022    1:26 PM  BP/Weight  Systolic BP 220 254 270  Diastolic BP 81 70 70      Physical Exam Constitutional:      Appearance: He is well-developed.  Cardiovascular:  Rate and Rhythm: Bradycardia present.     Heart sounds: Normal heart sounds. No murmur heard. Pulmonary:     Effort: Pulmonary effort is normal.     Breath sounds: Normal breath sounds. No wheezing or rales.  Chest:     Chest wall: No tenderness.  Abdominal:     General: Bowel sounds are normal. There  is no distension.     Palpations: Abdomen is soft. There is no mass.     Tenderness: There is no abdominal tenderness.  Genitourinary:    Comments: Slight mucus on urethral meatus, Foley catheter present with dark Kovaleski urine in bag Musculoskeletal:     Right lower leg: No edema.     Left lower leg: No edema.     Comments: Generalized spasticity with flexion deformities  Neurological:     Mental Status: He is alert. Mental status is at baseline.  Psychiatric:        Mood and Affect: Mood normal.        Latest Ref Rng & Units 05/19/2022    3:06 AM 05/18/2022    3:07 AM 05/17/2022   12:13 PM  CMP  Glucose 70 - 99 mg/dL 93  113  120   BUN 8 - 23 mg/dL 69  74  77   Creatinine 0.61 - 1.24 mg/dL 3.29  3.35  3.87   Sodium 135 - 145 mmol/L 141  139  141   Potassium 3.5 - 5.1 mmol/L 4.6  4.7  4.9   Chloride 98 - 111 mmol/L 116  113  110   CO2 22 - 32 mmol/L '18  20  24   '$ Calcium 8.9 - 10.3 mg/dL 8.8  8.8  9.5   Total Protein 6.5 - 8.1 g/dL 4.9   6.1   Total Bilirubin 0.3 - 1.2 mg/dL 0.3   0.5   Alkaline Phos 38 - 126 U/L 61   72   AST 15 - 41 U/L 10   13   ALT 0 - 44 U/L 14   16     Lipid Panel     Component Value Date/Time   CHOL 112 06/13/2018 0954   TRIG 51 06/13/2018 0954   HDL 40 06/13/2018 0954   CHOLHDL 2.8 06/13/2018 0954   CHOLHDL 4.1 08/16/2015 1555   VLDL 23 08/16/2015 1555   LDLCALC 62 06/13/2018 0954    CBC    Component Value Date/Time   WBC 8.3 05/19/2022 0306   RBC 2.73 (L) 05/19/2022 0306   HGB 8.1 (L) 05/19/2022 0306   HGB 10.3 (L) 10/27/2021 1115   HCT 24.8 (L) 05/19/2022 0306   HCT 30.5 (L) 10/27/2021 1115   PLT 185 05/19/2022 0306   PLT 300 10/27/2021 1115   MCV 90.8 05/19/2022 0306   MCV 88 10/27/2021 1115   MCH 29.7 05/19/2022 0306   MCHC 32.7 05/19/2022 0306   RDW 14.4 05/19/2022 0306   RDW 12.8 10/27/2021 1115   LYMPHSABS 1.7 05/17/2022 1213   LYMPHSABS 2.4 10/27/2021 1115   MONOABS 0.9 05/17/2022 1213   EOSABS 0.5 05/17/2022 1213    EOSABS 0.5 (H) 10/27/2021 1115   BASOSABS 0.0 05/17/2022 1213   BASOSABS 0.1 10/27/2021 1115    Lab Results  Component Value Date   HGBA1C 5.1 08/20/2019    Assessment & Plan:  1. Essential hypertension Slightly elevated No regimen changes his blood pressures have been normal previously Counseled on blood pressure goal of less than 130/80, low-sodium, DASH diet, medication compliance, 150  minutes of moderate intensity exercise per week. Discussed medication compliance, adverse effects. - amLODipine (NORVASC) 10 MG tablet; Take 1 tablet (10 mg total) by mouth daily.  Dispense: 90 tablet; Refill: 1  2. Hemiparesis affecting left side as late effect of stroke [place]) He has undergone PT in the past I have gotten in touch with the case manager so physical therapy can be arranged for him via home health Secondary risk factor modification - atorvastatin (LIPITOR) 40 MG tablet; Take 1 tablet (40 mg total) by mouth daily.  Dispense: 90 tablet; Refill: 1  3. 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  Dispense: 60 tablet; Refill: 5  4. Cough in adult patient Secondary to #3 above - glycopyrrolate (ROBINUL) 1 MG tablet; Take 1 tablet (1 mg total) by mouth 2 times daily.  Dispense: 60 tablet; Refill: 5  5. Urinary retention Foley catheter in place Follow-up with urology  6. Deep vein thrombosis (DVT) of left lower extremity, unspecified chronicity, unspecified vein ([name]) We will remain on anticoagulation for 3 months until 07/2022 - apixaban (ELIQUIS) 5 MG TABS tablet; Take 1 tablet (5 mg total) by mouth 2 (two) times daily. Upon completion of starter pack  Dispense: 60 tablet; Refill: 1  7. Need for immunization  against influenza - Flu Vaccine QUAD 71moIM (Fluarix, Fluzone & Alfiuria Quad PF)  8. Symptoms involving urinary system We will treat presumptively for UTI given dark coloration of urine His reduced intake of fluids could also explain this but given he is nonverbal I will treat him with antibiotics - cephALEXin (KEFLEX) 500 MG capsule; Take 1 capsule (500 mg total) by mouth 4 (four) times daily.  Dispense: 14 capsule; Refill: 0    Meds ordered this encounter  Medications   apixaban (ELIQUIS) 5 MG TABS tablet    Sig: Take 1 tablet (5 mg total) by mouth 2 (two) times daily. Upon completion of starter pack    Dispense:  60 tablet    Refill:  1   amLODipine (NORVASC) 10 MG tablet    Sig: Take 1 tablet (10 mg total) by mouth daily.    Dispense:  90 tablet    Refill:  1   atorvastatin (LIPITOR) 40 MG tablet    Sig: Take 1 tablet (40 mg total) by mouth daily.    Dispense:  90 tablet    Refill:  1   glycopyrrolate (ROBINUL) 1 MG tablet    Sig: Take 1 tablet (1 mg total) by mouth 2 times daily.    Dispense:  60 tablet    Refill:  5   cephALEXin (KEFLEX) 500 MG capsule    Sig: Take 1 capsule (500 mg total) by mouth 4 (four) times daily.    Dispense:  14 capsule    Refill:  0    Follow-up: Return in about 3 months (around 09/06/2022) for Chronic medical conditions.       ECharlott Rakes MD, FAAFP. CChe[facility] Surgery[facility] WMelvinaGTracy [facility]  06/07/2022, 3:41 PM

## 2022-06-08 ENCOUNTER — Encounter: Payer: Self-pay | Admitting: Family Medicine

## 2022-06-10 DIAGNOSIS — J9601 Acute respiratory failure with hypoxia: Secondary | ICD-10-CM | POA: Diagnosis not present

## 2022-06-10 DIAGNOSIS — R338 Other retention of urine: Secondary | ICD-10-CM | POA: Diagnosis not present

## 2022-06-10 DIAGNOSIS — N390 Urinary tract infection, site not specified: Secondary | ICD-10-CM | POA: Diagnosis not present

## 2022-06-10 DIAGNOSIS — H401134 Primary open-angle glaucoma, bilateral, indeterminate stage: Secondary | ICD-10-CM | POA: Diagnosis not present

## 2022-06-10 DIAGNOSIS — G1223 Primary lateral sclerosis: Secondary | ICD-10-CM | POA: Diagnosis not present

## 2022-06-10 DIAGNOSIS — N184 Chronic kidney disease, stage 4 (severe): Secondary | ICD-10-CM | POA: Diagnosis not present

## 2022-06-10 DIAGNOSIS — G40909 Epilepsy, unspecified, not intractable, without status epilepticus: Secondary | ICD-10-CM | POA: Diagnosis not present

## 2022-06-10 DIAGNOSIS — I129 Hypertensive chronic kidney disease with stage 1 through stage 4 chronic kidney disease, or unspecified chronic kidney disease: Secondary | ICD-10-CM | POA: Diagnosis not present

## 2022-06-10 DIAGNOSIS — J189 Pneumonia, unspecified organism: Secondary | ICD-10-CM | POA: Diagnosis not present

## 2022-06-10 DIAGNOSIS — E782 Mixed hyperlipidemia: Secondary | ICD-10-CM | POA: Diagnosis not present

## 2022-06-10 DIAGNOSIS — I700 Atherosclerosis of aorta: Secondary | ICD-10-CM | POA: Diagnosis not present

## 2022-06-10 DIAGNOSIS — N39498 Other specified urinary incontinence: Secondary | ICD-10-CM | POA: Diagnosis not present

## 2022-06-10 DIAGNOSIS — D631 Anemia in chronic kidney disease: Secondary | ICD-10-CM | POA: Diagnosis not present

## 2022-06-10 DIAGNOSIS — N401 Enlarged prostate with lower urinary tract symptoms: Secondary | ICD-10-CM | POA: Diagnosis not present

## 2022-06-10 DIAGNOSIS — I69391 Dysphagia following cerebral infarction: Secondary | ICD-10-CM | POA: Diagnosis not present

## 2022-06-10 DIAGNOSIS — Z466 Encounter for fitting and adjustment of urinary device: Secondary | ICD-10-CM | POA: Diagnosis not present

## 2022-06-10 DIAGNOSIS — R1312 Dysphagia, oropharyngeal phase: Secondary | ICD-10-CM | POA: Diagnosis not present

## 2022-06-10 DIAGNOSIS — N3289 Other specified disorders of bladder: Secondary | ICD-10-CM | POA: Diagnosis not present

## 2022-06-10 DIAGNOSIS — M1990 Unspecified osteoarthritis, unspecified site: Secondary | ICD-10-CM | POA: Diagnosis not present

## 2022-06-10 DIAGNOSIS — D1803 Hemangioma of intra-abdominal structures: Secondary | ICD-10-CM | POA: Diagnosis not present

## 2022-06-10 DIAGNOSIS — G6181 Chronic inflammatory demyelinating polyneuritis: Secondary | ICD-10-CM | POA: Diagnosis not present

## 2022-06-10 DIAGNOSIS — I69354 Hemiplegia and hemiparesis following cerebral infarction affecting left non-dominant side: Secondary | ICD-10-CM | POA: Diagnosis not present

## 2022-06-10 DIAGNOSIS — H35033 Hypertensive retinopathy, bilateral: Secondary | ICD-10-CM | POA: Diagnosis not present

## 2022-06-10 DIAGNOSIS — I82402 Acute embolism and thrombosis of unspecified deep veins of left lower extremity: Secondary | ICD-10-CM | POA: Diagnosis not present

## 2022-06-10 DIAGNOSIS — I441 Atrioventricular block, second degree: Secondary | ICD-10-CM | POA: Diagnosis not present

## 2022-06-12 ENCOUNTER — Telehealth: Payer: Self-pay | Admitting: Emergency Medicine

## 2022-06-12 ENCOUNTER — Telehealth: Payer: Self-pay | Admitting: Family Medicine

## 2022-06-12 DIAGNOSIS — R1312 Dysphagia, oropharyngeal phase: Secondary | ICD-10-CM | POA: Diagnosis not present

## 2022-06-12 DIAGNOSIS — N390 Urinary tract infection, site not specified: Secondary | ICD-10-CM | POA: Diagnosis not present

## 2022-06-12 DIAGNOSIS — G6181 Chronic inflammatory demyelinating polyneuritis: Secondary | ICD-10-CM | POA: Diagnosis not present

## 2022-06-12 DIAGNOSIS — N39498 Other specified urinary incontinence: Secondary | ICD-10-CM | POA: Diagnosis not present

## 2022-06-12 DIAGNOSIS — N401 Enlarged prostate with lower urinary tract symptoms: Secondary | ICD-10-CM | POA: Diagnosis not present

## 2022-06-12 DIAGNOSIS — H401134 Primary open-angle glaucoma, bilateral, indeterminate stage: Secondary | ICD-10-CM | POA: Diagnosis not present

## 2022-06-12 DIAGNOSIS — R338 Other retention of urine: Secondary | ICD-10-CM | POA: Diagnosis not present

## 2022-06-12 DIAGNOSIS — M1990 Unspecified osteoarthritis, unspecified site: Secondary | ICD-10-CM | POA: Diagnosis not present

## 2022-06-12 DIAGNOSIS — Z466 Encounter for fitting and adjustment of urinary device: Secondary | ICD-10-CM | POA: Diagnosis not present

## 2022-06-12 DIAGNOSIS — I69391 Dysphagia following cerebral infarction: Secondary | ICD-10-CM | POA: Diagnosis not present

## 2022-06-12 DIAGNOSIS — H35033 Hypertensive retinopathy, bilateral: Secondary | ICD-10-CM | POA: Diagnosis not present

## 2022-06-12 DIAGNOSIS — I441 Atrioventricular block, second degree: Secondary | ICD-10-CM | POA: Diagnosis not present

## 2022-06-12 DIAGNOSIS — G1223 Primary lateral sclerosis: Secondary | ICD-10-CM | POA: Diagnosis not present

## 2022-06-12 DIAGNOSIS — D1803 Hemangioma of intra-abdominal structures: Secondary | ICD-10-CM | POA: Diagnosis not present

## 2022-06-12 DIAGNOSIS — I129 Hypertensive chronic kidney disease with stage 1 through stage 4 chronic kidney disease, or unspecified chronic kidney disease: Secondary | ICD-10-CM | POA: Diagnosis not present

## 2022-06-12 DIAGNOSIS — I700 Atherosclerosis of aorta: Secondary | ICD-10-CM | POA: Diagnosis not present

## 2022-06-12 DIAGNOSIS — D631 Anemia in chronic kidney disease: Secondary | ICD-10-CM | POA: Diagnosis not present

## 2022-06-12 DIAGNOSIS — E782 Mixed hyperlipidemia: Secondary | ICD-10-CM | POA: Diagnosis not present

## 2022-06-12 DIAGNOSIS — G40909 Epilepsy, unspecified, not intractable, without status epilepticus: Secondary | ICD-10-CM | POA: Diagnosis not present

## 2022-06-12 DIAGNOSIS — J9601 Acute respiratory failure with hypoxia: Secondary | ICD-10-CM | POA: Diagnosis not present

## 2022-06-12 DIAGNOSIS — N3289 Other specified disorders of bladder: Secondary | ICD-10-CM | POA: Diagnosis not present

## 2022-06-12 DIAGNOSIS — I82402 Acute embolism and thrombosis of unspecified deep veins of left lower extremity: Secondary | ICD-10-CM | POA: Diagnosis not present

## 2022-06-12 DIAGNOSIS — N184 Chronic kidney disease, stage 4 (severe): Secondary | ICD-10-CM | POA: Diagnosis not present

## 2022-06-12 DIAGNOSIS — I69354 Hemiplegia and hemiparesis following cerebral infarction affecting left non-dominant side: Secondary | ICD-10-CM | POA: Diagnosis not present

## 2022-06-12 NOTE — Telephone Encounter (Signed)
Anna Genre aware of verbal order request for ST.   Requesting:Speech Therapy Frequency: 1 x wk per 9 wks

## 2022-06-12 NOTE — Telephone Encounter (Signed)
Home Health Verbal Orders - Caller/Agency: Anna Genre with [facility] Number: [phone] Requesting:Speech Therapy Frequency: 1 x wk per 9 wks

## 2022-06-12 NOTE — Telephone Encounter (Signed)
Copied from [place] [id number]. Topic: General - Other >> Jun 12, 2022  9:31 AM Ludger Nutting wrote: Home Health Verbal Orders - Caller/Agency: Pam/[facility] Callback Number: [phone] Requesting OT/PT/Skilled Nursing/Social Work/Speech Therapy: Skilled Nursing and PT Frequency: 1 week 1, 2 week 1, 1 week 2, 2 month 1, 2 prn

## 2022-06-12 NOTE — Telephone Encounter (Signed)
Pam was called and given verbal orders for patient.

## 2022-06-13 ENCOUNTER — Telehealth: Payer: Self-pay | Admitting: Family Medicine

## 2022-06-13 DIAGNOSIS — N401 Enlarged prostate with lower urinary tract symptoms: Secondary | ICD-10-CM | POA: Diagnosis not present

## 2022-06-13 DIAGNOSIS — N39498 Other specified urinary incontinence: Secondary | ICD-10-CM | POA: Diagnosis not present

## 2022-06-13 DIAGNOSIS — E782 Mixed hyperlipidemia: Secondary | ICD-10-CM | POA: Diagnosis not present

## 2022-06-13 DIAGNOSIS — Z466 Encounter for fitting and adjustment of urinary device: Secondary | ICD-10-CM | POA: Diagnosis not present

## 2022-06-13 DIAGNOSIS — N390 Urinary tract infection, site not specified: Secondary | ICD-10-CM | POA: Diagnosis not present

## 2022-06-13 DIAGNOSIS — I441 Atrioventricular block, second degree: Secondary | ICD-10-CM | POA: Diagnosis not present

## 2022-06-13 DIAGNOSIS — I700 Atherosclerosis of aorta: Secondary | ICD-10-CM | POA: Diagnosis not present

## 2022-06-13 DIAGNOSIS — D1803 Hemangioma of intra-abdominal structures: Secondary | ICD-10-CM | POA: Diagnosis not present

## 2022-06-13 DIAGNOSIS — G6181 Chronic inflammatory demyelinating polyneuritis: Secondary | ICD-10-CM | POA: Diagnosis not present

## 2022-06-13 DIAGNOSIS — R1312 Dysphagia, oropharyngeal phase: Secondary | ICD-10-CM | POA: Diagnosis not present

## 2022-06-13 DIAGNOSIS — I129 Hypertensive chronic kidney disease with stage 1 through stage 4 chronic kidney disease, or unspecified chronic kidney disease: Secondary | ICD-10-CM | POA: Diagnosis not present

## 2022-06-13 DIAGNOSIS — M1990 Unspecified osteoarthritis, unspecified site: Secondary | ICD-10-CM | POA: Diagnosis not present

## 2022-06-13 DIAGNOSIS — I69391 Dysphagia following cerebral infarction: Secondary | ICD-10-CM | POA: Diagnosis not present

## 2022-06-13 DIAGNOSIS — G40909 Epilepsy, unspecified, not intractable, without status epilepticus: Secondary | ICD-10-CM | POA: Diagnosis not present

## 2022-06-13 DIAGNOSIS — R338 Other retention of urine: Secondary | ICD-10-CM | POA: Diagnosis not present

## 2022-06-13 DIAGNOSIS — G1223 Primary lateral sclerosis: Secondary | ICD-10-CM | POA: Diagnosis not present

## 2022-06-13 DIAGNOSIS — H401134 Primary open-angle glaucoma, bilateral, indeterminate stage: Secondary | ICD-10-CM | POA: Diagnosis not present

## 2022-06-13 DIAGNOSIS — H35033 Hypertensive retinopathy, bilateral: Secondary | ICD-10-CM | POA: Diagnosis not present

## 2022-06-13 DIAGNOSIS — J9601 Acute respiratory failure with hypoxia: Secondary | ICD-10-CM | POA: Diagnosis not present

## 2022-06-13 DIAGNOSIS — D631 Anemia in chronic kidney disease: Secondary | ICD-10-CM | POA: Diagnosis not present

## 2022-06-13 DIAGNOSIS — N3289 Other specified disorders of bladder: Secondary | ICD-10-CM | POA: Diagnosis not present

## 2022-06-13 DIAGNOSIS — N184 Chronic kidney disease, stage 4 (severe): Secondary | ICD-10-CM | POA: Diagnosis not present

## 2022-06-13 DIAGNOSIS — I82402 Acute embolism and thrombosis of unspecified deep veins of left lower extremity: Secondary | ICD-10-CM | POA: Diagnosis not present

## 2022-06-13 DIAGNOSIS — I69354 Hemiplegia and hemiparesis following cerebral infarction affecting left non-dominant side: Secondary | ICD-10-CM | POA: Diagnosis not present

## 2022-06-13 NOTE — Telephone Encounter (Signed)
Verbal order were given for the patient. 

## 2022-06-13 NOTE — Telephone Encounter (Signed)
Home Health Verbal Orders - Caller/Agency: Belenda Cruise with [facility] Number: [phone]  please leave a voice mail if she does not pick up  Requesting OT/PT/Skilled Nursing/Social Work/Speech Therapy:   PT  Frequency: 1 week 1, 2 week 2,  1 week 4

## 2022-06-14 DIAGNOSIS — N39498 Other specified urinary incontinence: Secondary | ICD-10-CM | POA: Diagnosis not present

## 2022-06-14 DIAGNOSIS — H35033 Hypertensive retinopathy, bilateral: Secondary | ICD-10-CM | POA: Diagnosis not present

## 2022-06-14 DIAGNOSIS — R1312 Dysphagia, oropharyngeal phase: Secondary | ICD-10-CM | POA: Diagnosis not present

## 2022-06-14 DIAGNOSIS — D1803 Hemangioma of intra-abdominal structures: Secondary | ICD-10-CM | POA: Diagnosis not present

## 2022-06-14 DIAGNOSIS — I129 Hypertensive chronic kidney disease with stage 1 through stage 4 chronic kidney disease, or unspecified chronic kidney disease: Secondary | ICD-10-CM | POA: Diagnosis not present

## 2022-06-14 DIAGNOSIS — I69354 Hemiplegia and hemiparesis following cerebral infarction affecting left non-dominant side: Secondary | ICD-10-CM | POA: Diagnosis not present

## 2022-06-14 DIAGNOSIS — G1223 Primary lateral sclerosis: Secondary | ICD-10-CM | POA: Diagnosis not present

## 2022-06-14 DIAGNOSIS — J9601 Acute respiratory failure with hypoxia: Secondary | ICD-10-CM | POA: Diagnosis not present

## 2022-06-14 DIAGNOSIS — R338 Other retention of urine: Secondary | ICD-10-CM | POA: Diagnosis not present

## 2022-06-14 DIAGNOSIS — N401 Enlarged prostate with lower urinary tract symptoms: Secondary | ICD-10-CM | POA: Diagnosis not present

## 2022-06-14 DIAGNOSIS — G6181 Chronic inflammatory demyelinating polyneuritis: Secondary | ICD-10-CM | POA: Diagnosis not present

## 2022-06-14 DIAGNOSIS — Z466 Encounter for fitting and adjustment of urinary device: Secondary | ICD-10-CM | POA: Diagnosis not present

## 2022-06-14 DIAGNOSIS — H401134 Primary open-angle glaucoma, bilateral, indeterminate stage: Secondary | ICD-10-CM | POA: Diagnosis not present

## 2022-06-14 DIAGNOSIS — N3289 Other specified disorders of bladder: Secondary | ICD-10-CM | POA: Diagnosis not present

## 2022-06-14 DIAGNOSIS — M1990 Unspecified osteoarthritis, unspecified site: Secondary | ICD-10-CM | POA: Diagnosis not present

## 2022-06-14 DIAGNOSIS — I441 Atrioventricular block, second degree: Secondary | ICD-10-CM | POA: Diagnosis not present

## 2022-06-14 DIAGNOSIS — G40909 Epilepsy, unspecified, not intractable, without status epilepticus: Secondary | ICD-10-CM | POA: Diagnosis not present

## 2022-06-14 DIAGNOSIS — N184 Chronic kidney disease, stage 4 (severe): Secondary | ICD-10-CM | POA: Diagnosis not present

## 2022-06-14 DIAGNOSIS — I82402 Acute embolism and thrombosis of unspecified deep veins of left lower extremity: Secondary | ICD-10-CM | POA: Diagnosis not present

## 2022-06-14 DIAGNOSIS — N390 Urinary tract infection, site not specified: Secondary | ICD-10-CM | POA: Diagnosis not present

## 2022-06-14 DIAGNOSIS — D631 Anemia in chronic kidney disease: Secondary | ICD-10-CM | POA: Diagnosis not present

## 2022-06-14 DIAGNOSIS — I700 Atherosclerosis of aorta: Secondary | ICD-10-CM | POA: Diagnosis not present

## 2022-06-14 DIAGNOSIS — I69391 Dysphagia following cerebral infarction: Secondary | ICD-10-CM | POA: Diagnosis not present

## 2022-06-14 DIAGNOSIS — E782 Mixed hyperlipidemia: Secondary | ICD-10-CM | POA: Diagnosis not present

## 2022-06-16 DIAGNOSIS — I69354 Hemiplegia and hemiparesis following cerebral infarction affecting left non-dominant side: Secondary | ICD-10-CM | POA: Diagnosis not present

## 2022-06-16 DIAGNOSIS — N39498 Other specified urinary incontinence: Secondary | ICD-10-CM | POA: Diagnosis not present

## 2022-06-16 DIAGNOSIS — D631 Anemia in chronic kidney disease: Secondary | ICD-10-CM | POA: Diagnosis not present

## 2022-06-16 DIAGNOSIS — I69391 Dysphagia following cerebral infarction: Secondary | ICD-10-CM | POA: Diagnosis not present

## 2022-06-16 DIAGNOSIS — R1312 Dysphagia, oropharyngeal phase: Secondary | ICD-10-CM | POA: Diagnosis not present

## 2022-06-16 DIAGNOSIS — D1803 Hemangioma of intra-abdominal structures: Secondary | ICD-10-CM | POA: Diagnosis not present

## 2022-06-16 DIAGNOSIS — N390 Urinary tract infection, site not specified: Secondary | ICD-10-CM | POA: Diagnosis not present

## 2022-06-16 DIAGNOSIS — N401 Enlarged prostate with lower urinary tract symptoms: Secondary | ICD-10-CM | POA: Diagnosis not present

## 2022-06-16 DIAGNOSIS — I700 Atherosclerosis of aorta: Secondary | ICD-10-CM | POA: Diagnosis not present

## 2022-06-16 DIAGNOSIS — I129 Hypertensive chronic kidney disease with stage 1 through stage 4 chronic kidney disease, or unspecified chronic kidney disease: Secondary | ICD-10-CM | POA: Diagnosis not present

## 2022-06-16 DIAGNOSIS — N3289 Other specified disorders of bladder: Secondary | ICD-10-CM | POA: Diagnosis not present

## 2022-06-16 DIAGNOSIS — G1223 Primary lateral sclerosis: Secondary | ICD-10-CM | POA: Diagnosis not present

## 2022-06-16 DIAGNOSIS — H35033 Hypertensive retinopathy, bilateral: Secondary | ICD-10-CM | POA: Diagnosis not present

## 2022-06-16 DIAGNOSIS — M1990 Unspecified osteoarthritis, unspecified site: Secondary | ICD-10-CM | POA: Diagnosis not present

## 2022-06-16 DIAGNOSIS — Z466 Encounter for fitting and adjustment of urinary device: Secondary | ICD-10-CM | POA: Diagnosis not present

## 2022-06-16 DIAGNOSIS — G40909 Epilepsy, unspecified, not intractable, without status epilepticus: Secondary | ICD-10-CM | POA: Diagnosis not present

## 2022-06-16 DIAGNOSIS — E782 Mixed hyperlipidemia: Secondary | ICD-10-CM | POA: Diagnosis not present

## 2022-06-16 DIAGNOSIS — G6181 Chronic inflammatory demyelinating polyneuritis: Secondary | ICD-10-CM | POA: Diagnosis not present

## 2022-06-16 DIAGNOSIS — J9601 Acute respiratory failure with hypoxia: Secondary | ICD-10-CM | POA: Diagnosis not present

## 2022-06-16 DIAGNOSIS — I441 Atrioventricular block, second degree: Secondary | ICD-10-CM | POA: Diagnosis not present

## 2022-06-16 DIAGNOSIS — N184 Chronic kidney disease, stage 4 (severe): Secondary | ICD-10-CM | POA: Diagnosis not present

## 2022-06-16 DIAGNOSIS — R338 Other retention of urine: Secondary | ICD-10-CM | POA: Diagnosis not present

## 2022-06-16 DIAGNOSIS — H401134 Primary open-angle glaucoma, bilateral, indeterminate stage: Secondary | ICD-10-CM | POA: Diagnosis not present

## 2022-06-16 DIAGNOSIS — I82402 Acute embolism and thrombosis of unspecified deep veins of left lower extremity: Secondary | ICD-10-CM | POA: Diagnosis not present

## 2022-06-19 DIAGNOSIS — N184 Chronic kidney disease, stage 4 (severe): Secondary | ICD-10-CM | POA: Diagnosis not present

## 2022-06-19 DIAGNOSIS — D1803 Hemangioma of intra-abdominal structures: Secondary | ICD-10-CM | POA: Diagnosis not present

## 2022-06-19 DIAGNOSIS — G40909 Epilepsy, unspecified, not intractable, without status epilepticus: Secondary | ICD-10-CM | POA: Diagnosis not present

## 2022-06-19 DIAGNOSIS — I700 Atherosclerosis of aorta: Secondary | ICD-10-CM | POA: Diagnosis not present

## 2022-06-19 DIAGNOSIS — N401 Enlarged prostate with lower urinary tract symptoms: Secondary | ICD-10-CM | POA: Diagnosis not present

## 2022-06-19 DIAGNOSIS — I82402 Acute embolism and thrombosis of unspecified deep veins of left lower extremity: Secondary | ICD-10-CM | POA: Diagnosis not present

## 2022-06-19 DIAGNOSIS — I69354 Hemiplegia and hemiparesis following cerebral infarction affecting left non-dominant side: Secondary | ICD-10-CM | POA: Diagnosis not present

## 2022-06-19 DIAGNOSIS — N3289 Other specified disorders of bladder: Secondary | ICD-10-CM | POA: Diagnosis not present

## 2022-06-19 DIAGNOSIS — G1223 Primary lateral sclerosis: Secondary | ICD-10-CM | POA: Diagnosis not present

## 2022-06-19 DIAGNOSIS — H401134 Primary open-angle glaucoma, bilateral, indeterminate stage: Secondary | ICD-10-CM | POA: Diagnosis not present

## 2022-06-19 DIAGNOSIS — G6181 Chronic inflammatory demyelinating polyneuritis: Secondary | ICD-10-CM | POA: Diagnosis not present

## 2022-06-19 DIAGNOSIS — I69391 Dysphagia following cerebral infarction: Secondary | ICD-10-CM | POA: Diagnosis not present

## 2022-06-19 DIAGNOSIS — R1312 Dysphagia, oropharyngeal phase: Secondary | ICD-10-CM | POA: Diagnosis not present

## 2022-06-19 DIAGNOSIS — J9601 Acute respiratory failure with hypoxia: Secondary | ICD-10-CM | POA: Diagnosis not present

## 2022-06-19 DIAGNOSIS — H35033 Hypertensive retinopathy, bilateral: Secondary | ICD-10-CM | POA: Diagnosis not present

## 2022-06-19 DIAGNOSIS — N39498 Other specified urinary incontinence: Secondary | ICD-10-CM | POA: Diagnosis not present

## 2022-06-19 DIAGNOSIS — N390 Urinary tract infection, site not specified: Secondary | ICD-10-CM | POA: Diagnosis not present

## 2022-06-19 DIAGNOSIS — I129 Hypertensive chronic kidney disease with stage 1 through stage 4 chronic kidney disease, or unspecified chronic kidney disease: Secondary | ICD-10-CM | POA: Diagnosis not present

## 2022-06-19 DIAGNOSIS — M1990 Unspecified osteoarthritis, unspecified site: Secondary | ICD-10-CM | POA: Diagnosis not present

## 2022-06-19 DIAGNOSIS — D631 Anemia in chronic kidney disease: Secondary | ICD-10-CM | POA: Diagnosis not present

## 2022-06-19 DIAGNOSIS — I441 Atrioventricular block, second degree: Secondary | ICD-10-CM | POA: Diagnosis not present

## 2022-06-19 DIAGNOSIS — R338 Other retention of urine: Secondary | ICD-10-CM | POA: Diagnosis not present

## 2022-06-19 DIAGNOSIS — Z466 Encounter for fitting and adjustment of urinary device: Secondary | ICD-10-CM | POA: Diagnosis not present

## 2022-06-19 DIAGNOSIS — E782 Mixed hyperlipidemia: Secondary | ICD-10-CM | POA: Diagnosis not present

## 2022-06-20 ENCOUNTER — Ambulatory Visit (INDEPENDENT_AMBULATORY_CARE_PROVIDER_SITE_OTHER): Payer: Medicare HMO

## 2022-06-20 DIAGNOSIS — G6181 Chronic inflammatory demyelinating polyneuritis: Secondary | ICD-10-CM | POA: Diagnosis not present

## 2022-06-20 DIAGNOSIS — N3289 Other specified disorders of bladder: Secondary | ICD-10-CM | POA: Diagnosis not present

## 2022-06-20 DIAGNOSIS — N184 Chronic kidney disease, stage 4 (severe): Secondary | ICD-10-CM | POA: Diagnosis not present

## 2022-06-20 DIAGNOSIS — N390 Urinary tract infection, site not specified: Secondary | ICD-10-CM | POA: Diagnosis not present

## 2022-06-20 DIAGNOSIS — H401134 Primary open-angle glaucoma, bilateral, indeterminate stage: Secondary | ICD-10-CM | POA: Diagnosis not present

## 2022-06-20 DIAGNOSIS — D1803 Hemangioma of intra-abdominal structures: Secondary | ICD-10-CM | POA: Diagnosis not present

## 2022-06-20 DIAGNOSIS — I82402 Acute embolism and thrombosis of unspecified deep veins of left lower extremity: Secondary | ICD-10-CM | POA: Diagnosis not present

## 2022-06-20 DIAGNOSIS — I129 Hypertensive chronic kidney disease with stage 1 through stage 4 chronic kidney disease, or unspecified chronic kidney disease: Secondary | ICD-10-CM | POA: Diagnosis not present

## 2022-06-20 DIAGNOSIS — I69391 Dysphagia following cerebral infarction: Secondary | ICD-10-CM | POA: Diagnosis not present

## 2022-06-20 DIAGNOSIS — I441 Atrioventricular block, second degree: Secondary | ICD-10-CM

## 2022-06-20 DIAGNOSIS — R338 Other retention of urine: Secondary | ICD-10-CM | POA: Diagnosis not present

## 2022-06-20 DIAGNOSIS — N39498 Other specified urinary incontinence: Secondary | ICD-10-CM | POA: Diagnosis not present

## 2022-06-20 DIAGNOSIS — I700 Atherosclerosis of aorta: Secondary | ICD-10-CM | POA: Diagnosis not present

## 2022-06-20 DIAGNOSIS — N401 Enlarged prostate with lower urinary tract symptoms: Secondary | ICD-10-CM | POA: Diagnosis not present

## 2022-06-20 DIAGNOSIS — I69354 Hemiplegia and hemiparesis following cerebral infarction affecting left non-dominant side: Secondary | ICD-10-CM | POA: Diagnosis not present

## 2022-06-20 DIAGNOSIS — G1223 Primary lateral sclerosis: Secondary | ICD-10-CM | POA: Diagnosis not present

## 2022-06-20 DIAGNOSIS — G40909 Epilepsy, unspecified, not intractable, without status epilepticus: Secondary | ICD-10-CM | POA: Diagnosis not present

## 2022-06-20 DIAGNOSIS — M1990 Unspecified osteoarthritis, unspecified site: Secondary | ICD-10-CM | POA: Diagnosis not present

## 2022-06-20 DIAGNOSIS — D631 Anemia in chronic kidney disease: Secondary | ICD-10-CM | POA: Diagnosis not present

## 2022-06-20 DIAGNOSIS — R1312 Dysphagia, oropharyngeal phase: Secondary | ICD-10-CM | POA: Diagnosis not present

## 2022-06-20 DIAGNOSIS — Z466 Encounter for fitting and adjustment of urinary device: Secondary | ICD-10-CM | POA: Diagnosis not present

## 2022-06-20 DIAGNOSIS — E782 Mixed hyperlipidemia: Secondary | ICD-10-CM | POA: Diagnosis not present

## 2022-06-20 DIAGNOSIS — H35033 Hypertensive retinopathy, bilateral: Secondary | ICD-10-CM | POA: Diagnosis not present

## 2022-06-20 DIAGNOSIS — J9601 Acute respiratory failure with hypoxia: Secondary | ICD-10-CM | POA: Diagnosis not present

## 2022-06-21 ENCOUNTER — Encounter: Payer: Self-pay | Admitting: Internal Medicine

## 2022-06-21 LAB — CUP PACEART REMOTE DEVICE CHECK
Battery Remaining Longevity: 57 mo
Battery Voltage: 2.96 V
Brady Statistic AP VP Percent: 69.3 %
Brady Statistic AP VS Percent: 5.74 %
Brady Statistic AS VP Percent: 22.31 %
Brady Statistic AS VS Percent: 2.65 %
Brady Statistic RA Percent Paced: 75.55 %
Brady Statistic RV Percent Paced: 91.61 %
Date Time Interrogation Session: 20230926020529
Implantable Lead Implant Date: 20180424
Implantable Lead Implant Date: 20180424
Implantable Lead Location: 753859
Implantable Lead Location: 753860
Implantable Lead Model: 5076
Implantable Lead Model: 5076
Implantable Pulse Generator Implant Date: 20180424
Lead Channel Impedance Value: 247 Ohm
Lead Channel Impedance Value: 285 Ohm
Lead Channel Impedance Value: 361 Ohm
Lead Channel Impedance Value: 380 Ohm
Lead Channel Pacing Threshold Amplitude: 0.5 V
Lead Channel Pacing Threshold Amplitude: 0.625 V
Lead Channel Pacing Threshold Pulse Width: 0.4 ms
Lead Channel Pacing Threshold Pulse Width: 0.4 ms
Lead Channel Sensing Intrinsic Amplitude: 2.125 mV
Lead Channel Sensing Intrinsic Amplitude: 2.125 mV
Lead Channel Sensing Intrinsic Amplitude: 6.625 mV
Lead Channel Sensing Intrinsic Amplitude: 6.625 mV
Lead Channel Setting Pacing Amplitude: 1.5 V
Lead Channel Setting Pacing Amplitude: 2.5 V
Lead Channel Setting Pacing Pulse Width: 0.4 ms
Lead Channel Setting Sensing Sensitivity: 2 mV

## 2022-06-22 ENCOUNTER — Ambulatory Visit: Payer: Self-pay

## 2022-06-22 NOTE — Telephone Encounter (Signed)
Noted patient has appointment for UC tomorrow.

## 2022-06-22 NOTE — Telephone Encounter (Signed)
  Chief Complaint: scrotal swelling Symptoms: scrotal swelling, redness, and tenderness, frequent urination Frequency: several days  Pertinent Negatives: Patient denies fever, or hematuria Disposition: '[]'$ ED /'[x]'$ Urgent Care (no appt availability in office) / '[]'$ Appointment(In office/virtual)/ '[]'$  [facility]/ '[]'$ Home Care/ '[]'$ Refused Recommended Disposition /'[]'$  Mobile Bus/ '[]'$  Follow-up with PCP Additional Notes: pt's wife states symptoms started either a day before catheter changed or after. Swelling isn't bad she states but is noticeable. Advised no appts with practice and offered appt today with UC. Pt didn't want to go today, he rather go tomorrow evening. Appt scheduled at 1600. Wife was advised if sx get any worse before tomorrow to take pt to ED for eval. She verbalized understanding.    Summary: testicle concern   Pt's spouse [name] called in with concern. Pt has been experiencing redness in his testicle area. Pt had a catheter place with home health nurse on Tuesday, spouse says that he has had the redness since. Spouse says that she isn't sure that it came from that but wanted to make NT aware. Pt also has some swelling down there.    Spouse [name] would like to discuss and be advised further.             Reason for Disposition  Scrotum looks infected (e.g., draining sore, ulcer, red rash)  Answer Assessment - Initial Assessment Questions 1. SCROTAL SWELLING: "What does the scrotum look like?" "How swollen is it?" (mild, moderate severe; compare to other side)     Mild to moderate  2. LOCATION: "Where is the swelling located?"     Scrotal area  3. ONSET: "When did the swelling start?"     Past few days  4. PATTERN: "Does it come and go, or has it been constant since it started?"     Constant  5. SCROTAL PAIN: "Is there any pain?" If Yes, ask: "How bad is it?"  (Scale 1-10; or mild, moderate, severe)     Mild  7. OTHER SYMPTOMS: "Do you have any other  symptoms?" (e.g., abdomen pain, difficulty passing urine, fever, vomiting)     Tenderness, redness  Protocols used: Scrotum Swelling-A-AH

## 2022-06-23 ENCOUNTER — Ambulatory Visit: Payer: Self-pay

## 2022-06-23 DIAGNOSIS — L03314 Cellulitis of groin: Secondary | ICD-10-CM | POA: Diagnosis not present

## 2022-06-23 DIAGNOSIS — R338 Other retention of urine: Secondary | ICD-10-CM | POA: Diagnosis not present

## 2022-06-26 DIAGNOSIS — J9601 Acute respiratory failure with hypoxia: Secondary | ICD-10-CM | POA: Diagnosis not present

## 2022-06-26 DIAGNOSIS — M1990 Unspecified osteoarthritis, unspecified site: Secondary | ICD-10-CM | POA: Diagnosis not present

## 2022-06-26 DIAGNOSIS — I129 Hypertensive chronic kidney disease with stage 1 through stage 4 chronic kidney disease, or unspecified chronic kidney disease: Secondary | ICD-10-CM | POA: Diagnosis not present

## 2022-06-26 DIAGNOSIS — N3289 Other specified disorders of bladder: Secondary | ICD-10-CM | POA: Diagnosis not present

## 2022-06-26 DIAGNOSIS — H401134 Primary open-angle glaucoma, bilateral, indeterminate stage: Secondary | ICD-10-CM | POA: Diagnosis not present

## 2022-06-26 DIAGNOSIS — Z466 Encounter for fitting and adjustment of urinary device: Secondary | ICD-10-CM | POA: Diagnosis not present

## 2022-06-26 DIAGNOSIS — H35033 Hypertensive retinopathy, bilateral: Secondary | ICD-10-CM | POA: Diagnosis not present

## 2022-06-26 DIAGNOSIS — G1223 Primary lateral sclerosis: Secondary | ICD-10-CM | POA: Diagnosis not present

## 2022-06-26 DIAGNOSIS — E782 Mixed hyperlipidemia: Secondary | ICD-10-CM | POA: Diagnosis not present

## 2022-06-26 DIAGNOSIS — D1803 Hemangioma of intra-abdominal structures: Secondary | ICD-10-CM | POA: Diagnosis not present

## 2022-06-26 DIAGNOSIS — I441 Atrioventricular block, second degree: Secondary | ICD-10-CM | POA: Diagnosis not present

## 2022-06-26 DIAGNOSIS — I82402 Acute embolism and thrombosis of unspecified deep veins of left lower extremity: Secondary | ICD-10-CM | POA: Diagnosis not present

## 2022-06-26 DIAGNOSIS — N401 Enlarged prostate with lower urinary tract symptoms: Secondary | ICD-10-CM | POA: Diagnosis not present

## 2022-06-26 DIAGNOSIS — I69354 Hemiplegia and hemiparesis following cerebral infarction affecting left non-dominant side: Secondary | ICD-10-CM | POA: Diagnosis not present

## 2022-06-26 DIAGNOSIS — G40909 Epilepsy, unspecified, not intractable, without status epilepticus: Secondary | ICD-10-CM | POA: Diagnosis not present

## 2022-06-26 DIAGNOSIS — I700 Atherosclerosis of aorta: Secondary | ICD-10-CM | POA: Diagnosis not present

## 2022-06-26 DIAGNOSIS — R338 Other retention of urine: Secondary | ICD-10-CM | POA: Diagnosis not present

## 2022-06-26 DIAGNOSIS — R1312 Dysphagia, oropharyngeal phase: Secondary | ICD-10-CM | POA: Diagnosis not present

## 2022-06-26 DIAGNOSIS — N390 Urinary tract infection, site not specified: Secondary | ICD-10-CM | POA: Diagnosis not present

## 2022-06-26 DIAGNOSIS — I69391 Dysphagia following cerebral infarction: Secondary | ICD-10-CM | POA: Diagnosis not present

## 2022-06-26 DIAGNOSIS — N39498 Other specified urinary incontinence: Secondary | ICD-10-CM | POA: Diagnosis not present

## 2022-06-26 DIAGNOSIS — D631 Anemia in chronic kidney disease: Secondary | ICD-10-CM | POA: Diagnosis not present

## 2022-06-26 DIAGNOSIS — G6181 Chronic inflammatory demyelinating polyneuritis: Secondary | ICD-10-CM | POA: Diagnosis not present

## 2022-06-26 DIAGNOSIS — N184 Chronic kidney disease, stage 4 (severe): Secondary | ICD-10-CM | POA: Diagnosis not present

## 2022-06-27 DIAGNOSIS — H35033 Hypertensive retinopathy, bilateral: Secondary | ICD-10-CM | POA: Diagnosis not present

## 2022-06-27 DIAGNOSIS — N401 Enlarged prostate with lower urinary tract symptoms: Secondary | ICD-10-CM | POA: Diagnosis not present

## 2022-06-27 DIAGNOSIS — I441 Atrioventricular block, second degree: Secondary | ICD-10-CM | POA: Diagnosis not present

## 2022-06-27 DIAGNOSIS — J9601 Acute respiratory failure with hypoxia: Secondary | ICD-10-CM | POA: Diagnosis not present

## 2022-06-27 DIAGNOSIS — I69391 Dysphagia following cerebral infarction: Secondary | ICD-10-CM | POA: Diagnosis not present

## 2022-06-27 DIAGNOSIS — N390 Urinary tract infection, site not specified: Secondary | ICD-10-CM | POA: Diagnosis not present

## 2022-06-27 DIAGNOSIS — G1223 Primary lateral sclerosis: Secondary | ICD-10-CM | POA: Diagnosis not present

## 2022-06-27 DIAGNOSIS — I69354 Hemiplegia and hemiparesis following cerebral infarction affecting left non-dominant side: Secondary | ICD-10-CM | POA: Diagnosis not present

## 2022-06-27 DIAGNOSIS — G6181 Chronic inflammatory demyelinating polyneuritis: Secondary | ICD-10-CM | POA: Diagnosis not present

## 2022-06-27 DIAGNOSIS — Z466 Encounter for fitting and adjustment of urinary device: Secondary | ICD-10-CM | POA: Diagnosis not present

## 2022-06-27 DIAGNOSIS — N184 Chronic kidney disease, stage 4 (severe): Secondary | ICD-10-CM | POA: Diagnosis not present

## 2022-06-27 DIAGNOSIS — I82402 Acute embolism and thrombosis of unspecified deep veins of left lower extremity: Secondary | ICD-10-CM | POA: Diagnosis not present

## 2022-06-27 DIAGNOSIS — R1312 Dysphagia, oropharyngeal phase: Secondary | ICD-10-CM | POA: Diagnosis not present

## 2022-06-27 DIAGNOSIS — G40909 Epilepsy, unspecified, not intractable, without status epilepticus: Secondary | ICD-10-CM | POA: Diagnosis not present

## 2022-06-27 DIAGNOSIS — N39498 Other specified urinary incontinence: Secondary | ICD-10-CM | POA: Diagnosis not present

## 2022-06-27 DIAGNOSIS — N3289 Other specified disorders of bladder: Secondary | ICD-10-CM | POA: Diagnosis not present

## 2022-06-27 DIAGNOSIS — M1990 Unspecified osteoarthritis, unspecified site: Secondary | ICD-10-CM | POA: Diagnosis not present

## 2022-06-27 DIAGNOSIS — D631 Anemia in chronic kidney disease: Secondary | ICD-10-CM | POA: Diagnosis not present

## 2022-06-27 DIAGNOSIS — H401134 Primary open-angle glaucoma, bilateral, indeterminate stage: Secondary | ICD-10-CM | POA: Diagnosis not present

## 2022-06-27 DIAGNOSIS — I129 Hypertensive chronic kidney disease with stage 1 through stage 4 chronic kidney disease, or unspecified chronic kidney disease: Secondary | ICD-10-CM | POA: Diagnosis not present

## 2022-06-27 DIAGNOSIS — E782 Mixed hyperlipidemia: Secondary | ICD-10-CM | POA: Diagnosis not present

## 2022-06-27 DIAGNOSIS — D1803 Hemangioma of intra-abdominal structures: Secondary | ICD-10-CM | POA: Diagnosis not present

## 2022-06-27 DIAGNOSIS — R338 Other retention of urine: Secondary | ICD-10-CM | POA: Diagnosis not present

## 2022-06-27 DIAGNOSIS — I700 Atherosclerosis of aorta: Secondary | ICD-10-CM | POA: Diagnosis not present

## 2022-06-28 DIAGNOSIS — E782 Mixed hyperlipidemia: Secondary | ICD-10-CM | POA: Diagnosis not present

## 2022-06-28 DIAGNOSIS — I82402 Acute embolism and thrombosis of unspecified deep veins of left lower extremity: Secondary | ICD-10-CM | POA: Diagnosis not present

## 2022-06-28 DIAGNOSIS — H35033 Hypertensive retinopathy, bilateral: Secondary | ICD-10-CM | POA: Diagnosis not present

## 2022-06-28 DIAGNOSIS — D631 Anemia in chronic kidney disease: Secondary | ICD-10-CM | POA: Diagnosis not present

## 2022-06-28 DIAGNOSIS — R1312 Dysphagia, oropharyngeal phase: Secondary | ICD-10-CM | POA: Diagnosis not present

## 2022-06-28 DIAGNOSIS — D1803 Hemangioma of intra-abdominal structures: Secondary | ICD-10-CM | POA: Diagnosis not present

## 2022-06-28 DIAGNOSIS — I700 Atherosclerosis of aorta: Secondary | ICD-10-CM | POA: Diagnosis not present

## 2022-06-28 DIAGNOSIS — G40909 Epilepsy, unspecified, not intractable, without status epilepticus: Secondary | ICD-10-CM | POA: Diagnosis not present

## 2022-06-28 DIAGNOSIS — M1990 Unspecified osteoarthritis, unspecified site: Secondary | ICD-10-CM | POA: Diagnosis not present

## 2022-06-28 DIAGNOSIS — I69354 Hemiplegia and hemiparesis following cerebral infarction affecting left non-dominant side: Secondary | ICD-10-CM | POA: Diagnosis not present

## 2022-06-28 DIAGNOSIS — I69391 Dysphagia following cerebral infarction: Secondary | ICD-10-CM | POA: Diagnosis not present

## 2022-06-28 DIAGNOSIS — N390 Urinary tract infection, site not specified: Secondary | ICD-10-CM | POA: Diagnosis not present

## 2022-06-28 DIAGNOSIS — G1223 Primary lateral sclerosis: Secondary | ICD-10-CM | POA: Diagnosis not present

## 2022-06-28 DIAGNOSIS — J9601 Acute respiratory failure with hypoxia: Secondary | ICD-10-CM | POA: Diagnosis not present

## 2022-06-28 DIAGNOSIS — R338 Other retention of urine: Secondary | ICD-10-CM | POA: Diagnosis not present

## 2022-06-28 DIAGNOSIS — N184 Chronic kidney disease, stage 4 (severe): Secondary | ICD-10-CM | POA: Diagnosis not present

## 2022-06-28 DIAGNOSIS — G6181 Chronic inflammatory demyelinating polyneuritis: Secondary | ICD-10-CM | POA: Diagnosis not present

## 2022-06-28 DIAGNOSIS — Z466 Encounter for fitting and adjustment of urinary device: Secondary | ICD-10-CM | POA: Diagnosis not present

## 2022-06-28 DIAGNOSIS — H401134 Primary open-angle glaucoma, bilateral, indeterminate stage: Secondary | ICD-10-CM | POA: Diagnosis not present

## 2022-06-28 DIAGNOSIS — N401 Enlarged prostate with lower urinary tract symptoms: Secondary | ICD-10-CM | POA: Diagnosis not present

## 2022-06-28 DIAGNOSIS — I129 Hypertensive chronic kidney disease with stage 1 through stage 4 chronic kidney disease, or unspecified chronic kidney disease: Secondary | ICD-10-CM | POA: Diagnosis not present

## 2022-06-28 DIAGNOSIS — N39498 Other specified urinary incontinence: Secondary | ICD-10-CM | POA: Diagnosis not present

## 2022-06-28 DIAGNOSIS — N3289 Other specified disorders of bladder: Secondary | ICD-10-CM | POA: Diagnosis not present

## 2022-06-28 DIAGNOSIS — I441 Atrioventricular block, second degree: Secondary | ICD-10-CM | POA: Diagnosis not present

## 2022-06-29 DIAGNOSIS — I69391 Dysphagia following cerebral infarction: Secondary | ICD-10-CM | POA: Diagnosis not present

## 2022-06-29 DIAGNOSIS — G1223 Primary lateral sclerosis: Secondary | ICD-10-CM | POA: Diagnosis not present

## 2022-06-29 DIAGNOSIS — I700 Atherosclerosis of aorta: Secondary | ICD-10-CM | POA: Diagnosis not present

## 2022-06-29 DIAGNOSIS — J9601 Acute respiratory failure with hypoxia: Secondary | ICD-10-CM | POA: Diagnosis not present

## 2022-06-29 DIAGNOSIS — M1990 Unspecified osteoarthritis, unspecified site: Secondary | ICD-10-CM | POA: Diagnosis not present

## 2022-06-29 DIAGNOSIS — H35033 Hypertensive retinopathy, bilateral: Secondary | ICD-10-CM | POA: Diagnosis not present

## 2022-06-29 DIAGNOSIS — I441 Atrioventricular block, second degree: Secondary | ICD-10-CM | POA: Diagnosis not present

## 2022-06-29 DIAGNOSIS — N3289 Other specified disorders of bladder: Secondary | ICD-10-CM | POA: Diagnosis not present

## 2022-06-29 DIAGNOSIS — D631 Anemia in chronic kidney disease: Secondary | ICD-10-CM | POA: Diagnosis not present

## 2022-06-29 DIAGNOSIS — R1312 Dysphagia, oropharyngeal phase: Secondary | ICD-10-CM | POA: Diagnosis not present

## 2022-06-29 DIAGNOSIS — R338 Other retention of urine: Secondary | ICD-10-CM | POA: Diagnosis not present

## 2022-06-29 DIAGNOSIS — D1803 Hemangioma of intra-abdominal structures: Secondary | ICD-10-CM | POA: Diagnosis not present

## 2022-06-29 DIAGNOSIS — N401 Enlarged prostate with lower urinary tract symptoms: Secondary | ICD-10-CM | POA: Diagnosis not present

## 2022-06-29 DIAGNOSIS — G6181 Chronic inflammatory demyelinating polyneuritis: Secondary | ICD-10-CM | POA: Diagnosis not present

## 2022-06-29 DIAGNOSIS — N39498 Other specified urinary incontinence: Secondary | ICD-10-CM | POA: Diagnosis not present

## 2022-06-29 DIAGNOSIS — I69354 Hemiplegia and hemiparesis following cerebral infarction affecting left non-dominant side: Secondary | ICD-10-CM | POA: Diagnosis not present

## 2022-06-29 DIAGNOSIS — I129 Hypertensive chronic kidney disease with stage 1 through stage 4 chronic kidney disease, or unspecified chronic kidney disease: Secondary | ICD-10-CM | POA: Diagnosis not present

## 2022-06-29 DIAGNOSIS — N184 Chronic kidney disease, stage 4 (severe): Secondary | ICD-10-CM | POA: Diagnosis not present

## 2022-06-29 DIAGNOSIS — Z466 Encounter for fitting and adjustment of urinary device: Secondary | ICD-10-CM | POA: Diagnosis not present

## 2022-06-29 DIAGNOSIS — I82402 Acute embolism and thrombosis of unspecified deep veins of left lower extremity: Secondary | ICD-10-CM | POA: Diagnosis not present

## 2022-06-29 DIAGNOSIS — G40909 Epilepsy, unspecified, not intractable, without status epilepticus: Secondary | ICD-10-CM | POA: Diagnosis not present

## 2022-06-29 DIAGNOSIS — E782 Mixed hyperlipidemia: Secondary | ICD-10-CM | POA: Diagnosis not present

## 2022-06-29 DIAGNOSIS — N390 Urinary tract infection, site not specified: Secondary | ICD-10-CM | POA: Diagnosis not present

## 2022-06-29 DIAGNOSIS — H401134 Primary open-angle glaucoma, bilateral, indeterminate stage: Secondary | ICD-10-CM | POA: Diagnosis not present

## 2022-06-29 NOTE — Progress Notes (Signed)
Remote pacemaker transmission.   

## 2022-06-30 ENCOUNTER — Ambulatory Visit (INDEPENDENT_AMBULATORY_CARE_PROVIDER_SITE_OTHER): Payer: Medicare HMO

## 2022-06-30 ENCOUNTER — Ambulatory Visit (HOSPITAL_COMMUNITY): Payer: Medicare HMO

## 2022-06-30 ENCOUNTER — Encounter (HOSPITAL_COMMUNITY): Payer: Self-pay

## 2022-06-30 ENCOUNTER — Ambulatory Visit (HOSPITAL_COMMUNITY)
Admission: RE | Admit: 2022-06-30 | Discharge: 2022-06-30 | Disposition: A | Discharge status: Home / Self Care | Payer: Medicare HMO | Source: Ambulatory Visit

## 2022-06-30 ENCOUNTER — Ambulatory Visit: Payer: Self-pay | Admitting: *Deleted

## 2022-06-30 VITALS — BP 155/88 | HR 105 | Temp 98.6°F | Resp 16

## 2022-06-30 DIAGNOSIS — K649 Unspecified hemorrhoids: Secondary | ICD-10-CM

## 2022-06-30 DIAGNOSIS — K5900 Constipation, unspecified: Secondary | ICD-10-CM | POA: Diagnosis not present

## 2022-06-30 DIAGNOSIS — J189 Pneumonia, unspecified organism: Secondary | ICD-10-CM

## 2022-06-30 DIAGNOSIS — R059 Cough, unspecified: Secondary | ICD-10-CM | POA: Diagnosis not present

## 2022-06-30 DIAGNOSIS — J9811 Atelectasis: Secondary | ICD-10-CM | POA: Diagnosis not present

## 2022-06-30 MED ORDER — HYDROCORTISONE (PERIANAL) 2.5 % EX CREA: 1.0000 | TOPICAL_CREAM | Freq: Two times a day (BID) | CUTANEOUS | 0 refills | Status: DC

## 2022-06-30 MED ORDER — AZITHROMYCIN 250 MG PO TABS: 250.0000 mg | ORAL_TABLET | Freq: Every day | ORAL | 0 refills | Status: DC

## 2022-06-30 MED ORDER — POLYETHYLENE GLYCOL 3350 17 GM/SCOOP PO POWD: 17.0000 g | Freq: Every day | ORAL | 0 refills | Status: AC

## 2022-06-30 NOTE — Telephone Encounter (Signed)
  Chief Complaint: rectal pain, swelling Symptoms: no bm, fever Frequency: this week Pertinent Negatives: Patient denies vomiting Disposition: '[]'$ ED /'[x]'$ Urgent Care (no appt availability in office) / '[]'$ Appointment(In office/virtual)/ '[]'$  [facility]/ '[]'$ Home Care/ '[]'$ Refused Recommended Disposition /'[]'$ [facility]/ '[]'$  Follow-up with PCP Additional Notes: sounds like thrombosed hemorrhoid. Pt's wife encouraged to take him to UC. Addresses and numbers given. Home care discussed.   Reason for Disposition  SEVERE rectal pain (e.g., excruciating, unable to have a bowel movement)  Answer Assessment - Initial Assessment Questions 1. SYMPTOM:  "What's the main symptom you're concerned about?" (e.g., pain, itching, swelling, rash)     painful, swollen area is hanging out of rectum 2. ONSET: "When did the pain  start?"     This week 3. RECTAL PAIN: "Do you have any pain around your rectum?" "How bad is the pain?"  (Scale 0-10; or mild, moderate, severe)   - NONE (0): no pain   - MILD (1-3): doesn't interfere with normal activities    - MODERATE (4-7): interferes with normal activities or awakens from sleep, limping    - SEVERE (8-10): excruciating pain, unable to have a bowel movement      A lot of pain (this is his wife) 4. RECTAL ITCHING: "Do you have any itching in this area?" "How bad is the itching?"  (Scale 0-10; or mild, moderate, severe)   - NONE: no itching   - MILD: doesn't interfere with normal activities    - MODERATE-SEVERE: interferes with normal activities or awakens from sleep     Some, not much more painful 5. CONSTIPATION: "Do you have constipation?" If Yes, ask: "How bad is it?"     Small bm twice this week 6. CAUSE: "What do you think is causing the anus symptoms?"     External hemorrhoid 7. OTHER SYMPTOMS: "Do you have any other symptoms?"  (e.g., abdomen pain, fever, rectal bleeding, vomiting)     99.8 8. PREGNANCY: "Is there any chance you are  pregnant?" "When was your last menstrual period?"     na  Protocols used: Rectal Symptoms-A-AH

## 2022-06-30 NOTE — Discharge Instructions (Addendum)
Your chest xray Stable mild cardiomegaly and left basilar atelectasis, pneumonia or scarring Your abdominal xray Moderate fecal retention consistent with constipation. 2. No obstruction or ileus.   You have been prescribed Azithromycin 250 mg take as directed for pneumonia Constipation Miralax as directed Hemorrhoids Proctosol foam twice daily   We encourage conservative treatment with symptom relief. We encourage you to use Tylenol alternating with Ibuprofen for your fever if not contraindicated. (Remember to use as directed do not exceed daily dosing recommendations) We also encourage salt water gargles for your sore throat. You should also consider throat lozenges and chloraseptic spray.  Your cough can be soothed with a cough suppressant. We have prescribed you a cough suppressant to be taken as  directed.

## 2022-06-30 NOTE — ED Triage Notes (Signed)
Pt presents to office for possible hemorrhoids. Pt c/o irritation and pain.

## 2022-06-30 NOTE — ED Provider Notes (Signed)
[facility]    CSN: [id number] Arrival date & time: 06/30/22  1444      History   Chief Complaint Chief Complaint  Patient presents with   Hemorrhoids    HPI Darrell [name] is a 71 y.o. male.   HPI  He is in today with his wife for rectal pain. She reports that she called his PCP and was instructed that it was hemorrhoids that needed to be lanced.  She reports that he has been constipated and straining. His appetite has been decreased. She reports that he is drinking. His activity is limited to him being bed bound. He is incontinent of stool. Denies fever, chills, headache, dizziness, visual changes, nausea, vomiting or any edema.     Past Medical History:  Diagnosis Date   Arthritis    Asthma    At high risk for falls 08/16/2015   CIDP (chronic inflammatory demyelinating polyneuropathy) ([name])    CKD (chronic kidney disease) stage 3, GFR 30-59 ml/min ([place]) 08/16/2015   Dysphagia as late effect of cerebrovascular disease    pts wife states pt has to eat soft foods    Elevated liver enzymes 08/10/2016   GERD (gastroesophageal reflux disease)    Glaucoma    High cholesterol    History of CVA with residual deficit 03/25/2013   Hypertension    Hypertensive retinopathy of both eyes 01/16/2017   Inguinal hernia 03/25/2013   Liver hemangioma 08/14/2016   New onset seizure ([place]) 07/08/2017   seizure 07/14/18   Nuclear sclerosis of both eyes 10/25/2016   Pneumonia    Presence of permanent cardiac pacemaker    Primary open angle glaucoma of both eyes, indeterminate stage 10/25/2016   Renal mass, right 08/14/2016   Status cardiac pacemaker 01/29/2017   Placed for second degree heart block on 01/16/17 Medtronic Azure XT DR MRI SureScan dual-chamber pacemaker   Stroke [facility])    2011 with residual deficit left sided weakness   Tobacco dependence     Patient Active Problem List   Diagnosis Date Noted   DVT (deep venous thrombosis) ([place]) 05/17/2022   PNA (pneumonia)  05/17/2022   DNR (do not resuscitate) 04/13/2022   Community acquired bacterial pneumonia 03/23/2022   Acute respiratory failure with hypoxia ([place]) 10/10/2021   Coffee ground emesis 10/07/2021   Hyperkalemia 10/07/2021   Normocytic anemia 10/07/2021   Bigeminal rhythm 10/07/2021   Fecal impaction ([place])    Acute metabolic encephalopathy 34/19/3790   COVID-19 virus infection 05/06/2021   Anemia, chronic disease 05/06/2021   Benign prostatic hyperplasia 05/06/2021   Ulcer of ankle ([place]) 03/29/2021   CKD (chronic kidney disease), stage IV ([place]) 03/09/2021   Cognitive communication deficit 01/10/2021   Chronic bronchitis ([place]) 05/25/2020   Chronic cough 05/25/2020   Goals of care, counseling/discussion    Palliative care by specialist    ARF (acute renal failure) ([place]) 05/05/2019   Pain due to onychomycosis of toenails of both feet 03/25/2019   Primary lateral sclerosis ([place]) 08/28/2018   ALS (amyotrophic lateral sclerosis) ([place]) 07/24/2018   Dysarthria 05/21/2018   Dysphagia 05/21/2018   Gait abnormality 05/21/2018   Spasticity 05/21/2018   GERD (gastroesophageal reflux disease) 07/21/2017   Mixed hyperlipidemia 07/21/2017   Bradycardia    Seizure disorder ([place]) 07/08/2017   Status cardiac pacemaker 01/29/2017   Unintended weight loss 01/29/2017   Tinea pedis of right foot 01/29/2017   Hypertensive retinopathy of both eyes 01/16/2017   Hypercalcemia 01/15/2017   Heart block 01/15/2017  Nuclear sclerosis of both eyes 10/25/2016   Primary open angle glaucoma of both eyes, indeterminate stage 10/25/2016   Glaucoma 09/12/2016   Liver hemangioma 08/14/2016   Renal cyst 08/14/2016   Renal mass, right 08/14/2016   Elevated liver enzymes 08/10/2016   Chronic kidney disease, stage 3b ([place]) 08/16/2015   At high risk for falls 08/16/2015   Subcutaneous nodules 08/16/2015   Prediabetes 10/07/2013   Inguinal hernia 03/25/2013   History of CVA with residual deficit 03/25/2013    Essential hypertension 10/09/2012   Tobacco abuse 10/09/2012    Past Surgical History:  Procedure Laterality Date   [place] Right 11/23/2014   Procedure: right inguinal hernia repair with mesh;  Surgeon: Armandina Gemma, MD;  Location: [facility];  Service: General;  Laterality: Right;   INSERTION OF MESH N/A 11/23/2014   Procedure: INSERTION OF MESH;  Surgeon: Armandina Gemma, MD;  Location: [facility];  Service: General;  Laterality: N/A;   JOINT REPLACEMENT     MASS EXCISION Left 08/29/2017   Procedure: EXCISION OF LEFT NECK MASS;  Surgeon: Coralie Keens, MD;  Location: [facility];  Service: General;  Laterality: Left;   PACEMAKER IMPLANT N/A 01/16/2017   Procedure: Pacemaker Implant;  Surgeon: Will Meredith Leeds, MD;  Location: [facility];  Service: Cardiovascular;  Laterality: N/A;   SHOULDER SURGERY Bilateral 1988, 1998       Home Medications    Prior to Admission medications   Medication Sig Start Date End Date Taking? Authorizing Provider  azithromycin (ZITHROMAX) 250 MG tablet Take 1 tablet (250 mg total) by mouth daily. Take first 2 tablets together, then 1 every day until finished. 06/30/22  Yes Geralynn Capri, Diona Foley, NP  hydrocortisone (ANUSOL-HC) 2.5 % rectal cream Place 1 Application rectally 2 (two) times daily. 06/30/22  Yes Donnie Panik, Diona Foley, NP  montelukast (SINGULAIR) 10 MG tablet Take 1 tablet by mouth at bedtime. 06/19/22  Yes [provider]  polyethylene glycol powder (GLYCOLAX/MIRALAX) 17 GM/SCOOP powder Take 17 g by mouth daily. Follow instructions in the bottle. Use no more than 7 days. PRN for occasional constipation. 06/30/22 07/30/22 Yes Vevelyn Francois, NP  acetaminophen (TYLENOL) 325 MG tablet Take 325 mg by mouth daily as needed for mild pain or moderate pain.    [provider]  albuterol (VENTOLIN HFA) 108 (90 Base) MCG/ACT inhaler Inhale 2 puffs into the lungs every 6 (six) hours as needed for up to 30  days for Wheezing. 06/08/21   Argentina Donovan, PA-C  amLODipine (NORVASC) 10 MG tablet Take 1 tablet (10 mg total) by mouth daily. 06/07/22   [name], MD  apixaban (ELIQUIS) 5 MG TABS tablet Take 1 tablet (5 mg total) by mouth 2 (two) times daily. Upon completion of starter pack 06/07/22   [name], MD  APIXABAN Arne Cleveland) VTE STARTER PACK ('10MG'$  AND '5MG'$ ) Take as directed on package: start with two-'5mg'$  tablets twice daily for 7 days. On day 8, switch to one-'5mg'$  tablet twice daily. 05/19/22   Donne Hazel, MD  atorvastatin (LIPITOR) 40 MG tablet Take 1 tablet (40 mg total) by mouth daily. 06/07/22   [name], MD  brimonidine (ALPHAGAN) 0.2 % ophthalmic solution Place 1 drop into both eyes daily. 07/25/21   [provider]  cephALEXin (KEFLEX) 500 MG capsule Take 1 capsule (500 mg total) by mouth 4 (four) times daily. 06/07/22   [name], MD  ciprofloxacin (CIPRO) 250 MG tablet Take 250 mg by mouth 2 (two) times daily. 05/18/22   [provider]  dicyclomine (BENTYL) 10 MG capsule Take 10 mg by mouth daily as needed (pain).    [provider]  dorzolamide-timolol (COSOPT) 22.3-6.8 MG/ML ophthalmic solution Place 1 drop into both eyes daily in the afternoon.    [provider]  ferrous sulfate (FEROSUL) 325 (65 FE) MG tablet Take 1 tablet (325 mg total) by mouth 2 (two) times daily with a meal. 06/08/21   McClung, Dionne Bucy, PA-C  finasteride (PROSCAR) 5 MG tablet Take 5 mg by mouth daily. 12/13/21   [provider]  fluticasone (FLONASE) 50 MCG/ACT nasal spray Place 1 spray into both nostrils daily.    [provider]  fluticasone furoate-vilanterol (BREO ELLIPTA) 200-25 MCG/ACT AEPB Inhale 2 puffs into the lungs daily.    [provider]  glycopyrrolate (ROBINUL) 1 MG tablet Take 1 tablet (1 mg total) by mouth 2 times daily. 06/07/22   [name], MD  guaiFENesin (MUCINEX) 600 MG 12 hr tablet Take 600 mg by mouth 2  (two) times daily.    [provider]  hydrALAZINE (APRESOLINE) 25 MG tablet Take 1 tablet (25 mg total) by mouth 3 (three) times daily. 04/25/22   Shirley Friar, PA-C  latanoprost (XALATAN) 0.005 % ophthalmic solution Place 1 drop into both eyes at bedtime. 07/25/21   [provider]  levETIRAcetam (KEPPRA) 500 MG tablet Take 1 tablet (500 mg total) by mouth 2 (two) times daily. 12/19/21   Penumalli, Earlean Polka, MD  metoprolol succinate (TOPROL-XL) 50 MG 24 hr tablet Take 1 tablet (50 mg total) by mouth at bedtime. Take with or immediately following a meal. 04/04/22   Camnitz, Ocie Doyne, MD  montelukast (SINGULAIR) 10 MG tablet Take 10 mg by mouth at bedtime. 08/29/21   [provider]  Multiple Vitamin (MULTI-VITAMIN) tablet Take 1 tablet by mouth daily.    [provider]  ondansetron (ZOFRAN) 4 MG tablet Take 1 tablet (4 mg total) by mouth every 8 (eight) hours as needed for nausea or vomiting. 05/12/22   [name], MD  polyethylene glycol (MIRALAX) 17 g packet Take 17 g by mouth daily. 10/11/21   Shelly Coss, MD  tamsulosin (FLOMAX) 0.4 MG CAPS capsule Take 1 capsule (0.4 mg total) by mouth daily. 01/24/22   [name], MD  torsemide (DEMADEX) 20 MG tablet Take 20 mg by mouth daily as needed (fluid).    [provider]    Family History Family History  Problem Relation Age of Onset   Hypertension Mother    Diabetes Sister    Hypertension Sister    Cancer Sister        1 sister   COPD Sister        in 1 sister   Stomach cancer Neg Hx    Colon cancer Neg Hx    Pancreatic cancer Neg Hx    Esophageal cancer Neg Hx     Social History Social History   Tobacco Use   Smoking status: Every Day    Packs/day: 1.00    Years: 40.00    Total pack years: 40.00    Types: Cigarettes   Smokeless tobacco: Never   Tobacco comments:    12/13/20 5 a day  Vaping Use   Vaping Use: Never used  Substance Use Topics   Alcohol use: No     Comment: alcohol free for 1 year was  drinking 1/2 pint per day    Drug use: No     Allergies   Ace inhibitors, Doxycycline, Atacand hct [candesartan cilexetil-hctz], and Shellfish allergy   Review of Systems Review of Systems   Physical Exam Triage Vital Signs ED Triage Vitals [06/30/22 1515]  Enc Vitals Group     BP (!) 155/88     Pulse Rate (!) 105     Resp 16     Temp 98.6 F (37 C)     Temp Source Oral     SpO2 99 %     Weight      Height      Head Circumference      Peak Flow      Pain Score      Pain Loc      Pain Edu?      Excl. in [place]?    No data found.  Updated Vital Signs BP (!) 155/88 (BP Location: Left Arm)   Pulse (!) 105   Temp 98.6 F (37 C) (Oral)   Resp 16   SpO2 99%   Visual Acuity Right Eye Distance:   Left Eye Distance:   Bilateral Distance:    Right Eye Near:   Left Eye Near:    Bilateral Near:     Physical Exam Constitutional:      Comments: Eyes closed with response to one or 2 questions  Neck:     Comments: Large nodule to the base of left neck Cardiovascular:     Rate and Rhythm: Normal rate.     Pulses: Normal pulses.  Pulmonary:     Effort: Pulmonary effort is normal.     Comments: Deep wet cough heard while obtaining HPI Diminished BS Abdominal:     Palpations: Abdomen is soft.     Comments: Decreased bowel sounds  Musculoskeletal:     Comments: Wheelchair bound  Skin:    General: Skin is warm and dry.  Neurological:     General: No focal deficit present.     Mental Status: He is oriented to person, place, and time.      UC Treatments / Results  Labs (all labs ordered are listed, but only abnormal results are displayed) Labs Reviewed - No data to display  EKG   Radiology DG Abd 1 View  Result Date: 06/30/2022 CLINICAL DATA:  Constipation, possible hemorrhoids EXAM: ABDOMEN - 1 VIEW COMPARISON:  04/12/2022 FINDINGS: 2 supine frontal views of the abdomen and pelvis excludes the hemidiaphragms by  collimation. There is moderate retained stool throughout the colon consistent with constipation. No bowel obstruction or ileus. There are no masses or abnormal calcifications. No acute bony abnormality. IMPRESSION: 1. Moderate fecal retention consistent with constipation. 2. No obstruction or ileus. Electronically Signed   By: Randa Ngo M.D.   On: 06/30/2022 17:33   DG Chest 2 View  Result Date: 06/30/2022 CLINICAL DATA:  Deep cough. EXAM: CHEST - 2 VIEW COMPARISON:  05/17/2022 FINDINGS: Stable mildly enlarged cardiac silhouette and left subclavian bipolar pacemaker leads. Ill-defined increased density at the left lateral lung base without significant change. The remainder of the lungs are clear. Unremarkable bones. IMPRESSION: Stable mild cardiomegaly and left basilar atelectasis, pneumonia or scarring. Electronically Signed   By: Claudie Revering M.D.   On: 06/30/2022 16:35    Procedures Procedures (including critical care time)  Medications Ordered in UC Medications - No data to display  Initial Impression / Assessment and Plan / UC Course  I  have reviewed the triage vital signs and the nursing notes.  Pertinent labs & imaging results that were available during my care of the patient were reviewed by me and considered in my medical decision making (see chart for details).     Rectal pain  Final Clinical Impressions(s) / UC Diagnoses   Final diagnoses:  Hemorrhoids, unspecified hemorrhoid type  Pneumonia of left lower lobe due to infectious organism     Discharge Instructions      Your chest xray Stable mild cardiomegaly and left basilar atelectasis, pneumonia or scarring Your abdominal xray Moderate fecal retention consistent with constipation. 2. No obstruction or ileus.   You have been prescribed Azithromycin 250 mg take as directed for pneumonia Constipation Miralax as directed Hemorrhoids Proctosol foam twice daily   We encourage conservative treatment with symptom  relief. We encourage you to use Tylenol alternating with Ibuprofen for your fever if not contraindicated. (Remember to use as directed do not exceed daily dosing recommendations) We also encourage salt water gargles for your sore throat. You should also consider throat lozenges and chloraseptic spray.  Your cough can be soothed with a cough suppressant. We have prescribed you a cough suppressant to be taken as  directed.       ED Prescriptions     Medication Sig Dispense Auth. Provider   hydrocortisone (ANUSOL-HC) 2.5 % rectal cream Place 1 Application rectally 2 (two) times daily. 30 g Vevelyn Francois, NP   azithromycin (ZITHROMAX) 250 MG tablet Take 1 tablet (250 mg total) by mouth daily. Take first 2 tablets together, then 1 every day until finished. 6 tablet Dionisio David M, NP   polyethylene glycol powder (GLYCOLAX/MIRALAX) 17 GM/SCOOP powder Take 17 g by mouth daily. Follow instructions in the bottle. Use no more than 7 days. PRN for occasional constipation. 500 g Vevelyn Francois, NP      PDMP not reviewed this encounter.   Dionisio David [place], [place] 06/30/22 1824

## 2022-06-30 NOTE — Telephone Encounter (Signed)
Pt has appointment for urgent care today.

## 2022-07-04 ENCOUNTER — Ambulatory Visit (INDEPENDENT_AMBULATORY_CARE_PROVIDER_SITE_OTHER): Payer: Medicare HMO | Admitting: Podiatry

## 2022-07-04 ENCOUNTER — Encounter: Payer: Self-pay | Admitting: Podiatry

## 2022-07-04 DIAGNOSIS — M79674 Pain in right toe(s): Secondary | ICD-10-CM | POA: Diagnosis not present

## 2022-07-04 DIAGNOSIS — M79675 Pain in left toe(s): Secondary | ICD-10-CM

## 2022-07-04 DIAGNOSIS — B351 Tinea unguium: Secondary | ICD-10-CM | POA: Diagnosis not present

## 2022-07-05 ENCOUNTER — Other Ambulatory Visit: Payer: Self-pay

## 2022-07-05 ENCOUNTER — Encounter (HOSPITAL_COMMUNITY): Payer: Self-pay

## 2022-07-05 ENCOUNTER — Emergency Department (HOSPITAL_COMMUNITY): Payer: Medicare HMO

## 2022-07-05 ENCOUNTER — Inpatient Hospital Stay (HOSPITAL_COMMUNITY)
Admission: EM | Admit: 2022-07-05 | Discharge: 2022-07-09 | DRG: 177 | Disposition: A | Discharge status: Home / Self Care | Payer: Medicare HMO | Attending: Internal Medicine | Admitting: Internal Medicine

## 2022-07-05 DIAGNOSIS — E875 Hyperkalemia: Secondary | ICD-10-CM | POA: Diagnosis present

## 2022-07-05 DIAGNOSIS — Z66 Do not resuscitate: Secondary | ICD-10-CM | POA: Diagnosis present

## 2022-07-05 DIAGNOSIS — Z8249 Family history of ischemic heart disease and other diseases of the circulatory system: Secondary | ICD-10-CM

## 2022-07-05 DIAGNOSIS — Z809 Family history of malignant neoplasm, unspecified: Secondary | ICD-10-CM

## 2022-07-05 DIAGNOSIS — Z833 Family history of diabetes mellitus: Secondary | ICD-10-CM

## 2022-07-05 DIAGNOSIS — I10 Essential (primary) hypertension: Secondary | ICD-10-CM | POA: Diagnosis not present

## 2022-07-05 DIAGNOSIS — Z8616 Personal history of COVID-19: Secondary | ICD-10-CM

## 2022-07-05 DIAGNOSIS — I82509 Chronic embolism and thrombosis of unspecified deep veins of unspecified lower extremity: Secondary | ICD-10-CM | POA: Diagnosis present

## 2022-07-05 DIAGNOSIS — N184 Chronic kidney disease, stage 4 (severe): Secondary | ICD-10-CM | POA: Diagnosis present

## 2022-07-05 DIAGNOSIS — N390 Urinary tract infection, site not specified: Secondary | ICD-10-CM | POA: Diagnosis present

## 2022-07-05 DIAGNOSIS — D638 Anemia in other chronic diseases classified elsewhere: Secondary | ICD-10-CM | POA: Diagnosis present

## 2022-07-05 DIAGNOSIS — G40909 Epilepsy, unspecified, not intractable, without status epilepticus: Secondary | ICD-10-CM | POA: Diagnosis present

## 2022-07-05 DIAGNOSIS — Z79899 Other long term (current) drug therapy: Secondary | ICD-10-CM

## 2022-07-05 DIAGNOSIS — J690 Pneumonitis due to inhalation of food and vomit: Secondary | ICD-10-CM | POA: Diagnosis not present

## 2022-07-05 DIAGNOSIS — R531 Weakness: Secondary | ICD-10-CM | POA: Diagnosis not present

## 2022-07-05 DIAGNOSIS — N319 Neuromuscular dysfunction of bladder, unspecified: Secondary | ICD-10-CM | POA: Diagnosis present

## 2022-07-05 DIAGNOSIS — I129 Hypertensive chronic kidney disease with stage 1 through stage 4 chronic kidney disease, or unspecified chronic kidney disease: Secondary | ICD-10-CM | POA: Diagnosis present

## 2022-07-05 DIAGNOSIS — R339 Retention of urine, unspecified: Secondary | ICD-10-CM | POA: Diagnosis present

## 2022-07-05 DIAGNOSIS — Z825 Family history of asthma and other chronic lower respiratory diseases: Secondary | ICD-10-CM

## 2022-07-05 DIAGNOSIS — R1310 Dysphagia, unspecified: Secondary | ICD-10-CM | POA: Diagnosis present

## 2022-07-05 DIAGNOSIS — J189 Pneumonia, unspecified organism: Secondary | ICD-10-CM | POA: Diagnosis not present

## 2022-07-05 DIAGNOSIS — Z86718 Personal history of other venous thrombosis and embolism: Secondary | ICD-10-CM

## 2022-07-05 DIAGNOSIS — Z950 Presence of cardiac pacemaker: Secondary | ICD-10-CM

## 2022-07-05 DIAGNOSIS — Z7401 Bed confinement status: Secondary | ICD-10-CM

## 2022-07-05 DIAGNOSIS — G1221 Amyotrophic lateral sclerosis: Secondary | ICD-10-CM | POA: Diagnosis present

## 2022-07-05 DIAGNOSIS — B965 Pseudomonas (aeruginosa) (mallei) (pseudomallei) as the cause of diseases classified elsewhere: Secondary | ICD-10-CM | POA: Diagnosis present

## 2022-07-05 DIAGNOSIS — E7800 Pure hypercholesterolemia, unspecified: Secondary | ICD-10-CM | POA: Diagnosis present

## 2022-07-05 DIAGNOSIS — Y846 Urinary catheterization as the cause of abnormal reaction of the patient, or of later complication, without mention of misadventure at the time of the procedure: Secondary | ICD-10-CM | POA: Diagnosis present

## 2022-07-05 DIAGNOSIS — D631 Anemia in chronic kidney disease: Secondary | ICD-10-CM | POA: Diagnosis present

## 2022-07-05 DIAGNOSIS — G9341 Metabolic encephalopathy: Secondary | ICD-10-CM | POA: Diagnosis present

## 2022-07-05 DIAGNOSIS — F17210 Nicotine dependence, cigarettes, uncomplicated: Secondary | ICD-10-CM | POA: Diagnosis present

## 2022-07-05 DIAGNOSIS — R059 Cough, unspecified: Secondary | ICD-10-CM | POA: Diagnosis not present

## 2022-07-05 DIAGNOSIS — T83518A Infection and inflammatory reaction due to other urinary catheter, initial encounter: Secondary | ICD-10-CM | POA: Diagnosis present

## 2022-07-05 DIAGNOSIS — N3000 Acute cystitis without hematuria: Secondary | ICD-10-CM | POA: Diagnosis not present

## 2022-07-05 DIAGNOSIS — Z20822 Contact with and (suspected) exposure to covid-19: Secondary | ICD-10-CM | POA: Diagnosis present

## 2022-07-05 DIAGNOSIS — Z7901 Long term (current) use of anticoagulants: Secondary | ICD-10-CM

## 2022-07-05 DIAGNOSIS — I82409 Acute embolism and thrombosis of unspecified deep veins of unspecified lower extremity: Secondary | ICD-10-CM | POA: Diagnosis present

## 2022-07-05 LAB — CBC WITH DIFFERENTIAL/PLATELET
Abs Immature Granulocytes: 0.04 10*3/uL (ref 0.00–0.07)
Basophils Absolute: 0.1 10*3/uL (ref 0.0–0.1)
Basophils Relative: 1 %
Eosinophils Absolute: 0.4 10*3/uL (ref 0.0–0.5)
Eosinophils Relative: 5 %
HCT: 28 % — ABNORMAL LOW (ref 39.0–52.0)
Hemoglobin: 8.9 g/dL — ABNORMAL LOW (ref 13.0–17.0)
Immature Granulocytes: 1 %
Lymphocytes Relative: 23 %
Lymphs Abs: 2 10*3/uL (ref 0.7–4.0)
MCH: 28.6 pg (ref 26.0–34.0)
MCHC: 31.8 g/dL (ref 30.0–36.0)
MCV: 90 fL (ref 80.0–100.0)
Monocytes Absolute: 0.8 10*3/uL (ref 0.1–1.0)
Monocytes Relative: 9 %
Neutro Abs: 5.4 10*3/uL (ref 1.7–7.7)
Neutrophils Relative %: 61 %
Platelets: 287 10*3/uL (ref 150–400)
RBC: 3.11 MIL/uL — ABNORMAL LOW (ref 4.22–5.81)
RDW: 14.3 % (ref 11.5–15.5)
WBC: 8.8 10*3/uL (ref 4.0–10.5)
nRBC: 0 % (ref 0.0–0.2)

## 2022-07-05 LAB — COMPREHENSIVE METABOLIC PANEL
ALT: 16 U/L (ref 0–44)
AST: 18 U/L (ref 15–41)
Albumin: 2.9 g/dL — ABNORMAL LOW (ref 3.5–5.0)
Alkaline Phosphatase: 67 U/L (ref 38–126)
Anion gap: 6 (ref 5–15)
BUN: 51 mg/dL — ABNORMAL HIGH (ref 8–23)
CO2: 21 mmol/L — ABNORMAL LOW (ref 22–32)
Calcium: 9.4 mg/dL (ref 8.9–10.3)
Chloride: 111 mmol/L (ref 98–111)
Creatinine, Ser: 2.8 mg/dL — ABNORMAL HIGH (ref 0.61–1.24)
GFR, Estimated: 24 mL/min — ABNORMAL LOW (ref >60)
Glucose, Bld: 98 mg/dL (ref 70–99)
Potassium: 6.2 mmol/L — ABNORMAL HIGH (ref 3.5–5.1)
Sodium: 138 mmol/L (ref 135–145)
Total Bilirubin: 0.4 mg/dL (ref 0.3–1.2)
Total Protein: 6 g/dL — ABNORMAL LOW (ref 6.5–8.1)

## 2022-07-05 LAB — URINALYSIS, ROUTINE W REFLEX MICROSCOPIC
Bilirubin Urine: NEGATIVE
Glucose, UA: NEGATIVE mg/dL
Ketones, ur: NEGATIVE mg/dL
Nitrite: POSITIVE — AB
Protein, ur: 30 mg/dL — AB
RBC / HPF: >50 RBC/hpf — ABNORMAL HIGH (ref 0–5)
Specific Gravity, Urine: 1.008 (ref 1.005–1.030)
WBC, UA: >50 WBC/hpf — ABNORMAL HIGH (ref 0–5)
pH: 6 (ref 5.0–8.0)

## 2022-07-05 LAB — LACTIC ACID, PLASMA: Lactic Acid, Venous: 0.5 mmol/L (ref 0.5–1.9)

## 2022-07-05 LAB — RESP PANEL BY RT-PCR (FLU A&B, COVID) ARPGX2
Influenza A by PCR: NEGATIVE
Influenza B by PCR: NEGATIVE
SARS Coronavirus 2 by RT PCR: NEGATIVE

## 2022-07-05 MED ORDER — VANCOMYCIN HCL 1750 MG/350ML IV SOLN
1750.0000 mg | Freq: Once | INTRAVENOUS | Status: AC
  Administered 2022-07-06: 1750 mg via INTRAVENOUS
  Filled 2022-07-05: qty 350

## 2022-07-05 MED ORDER — SODIUM CHLORIDE 0.9 % IV SOLN
2.0000 g | Freq: Once | INTRAVENOUS | Status: AC
  Administered 2022-07-05: 2 g via INTRAVENOUS
  Filled 2022-07-05: qty 12.5

## 2022-07-05 NOTE — ED Provider Notes (Signed)
[facility] EMERGENCY DEPARTMENT Provider Note   CSN: [id number] Arrival date & time: 07/05/22  2222     History  Chief Complaint  Patient presents with   Altered Mental Status    Darrell [name] is a 71 y.o. male.  The history is provided by the patient and medical records.  Altered Mental Status Associated symptoms: weakness    71 year old male with history of prior stroke, ALS, anemia, hypertension chronic kidney disease stage IV, seizure disorder, presenting to the ED with family for generalized weakness.  Wife reports they were seen at urgent care on Friday due to some rectal pain due to hemorrhoids.  At that time he was noted to have a cough, chest x-ray was done concerning for pneumonia.  He was started on azithromycin.  Wife reports since Friday he continues to decline, now very generally weak and sleeping most of the time.  He is drinking Ensure or boost and eating some solid food but not like normal.  Has not had any vomiting or diarrhea.  He continues to have wet cough.  He has not had any falls or head trauma.  Home Medications Prior to Admission medications   Medication Sig Start Date End Date Taking? Authorizing Provider  acetaminophen (TYLENOL) 325 MG tablet Take 325 mg by mouth daily as needed for mild pain or moderate pain.    [provider]  albuterol (VENTOLIN HFA) 108 (90 Base) MCG/ACT inhaler Inhale 2 puffs into the lungs every 6 (six) hours as needed for up to 30 days for Wheezing. 06/08/21   Argentina Donovan, PA-C  amLODipine (NORVASC) 10 MG tablet Take 1 tablet (10 mg total) by mouth daily. 06/07/22   [name], MD  apixaban (ELIQUIS) 5 MG TABS tablet Take 1 tablet (5 mg total) by mouth 2 (two) times daily. Upon completion of starter pack 06/07/22   [name], MD  APIXABAN Arne Cleveland) VTE STARTER PACK ('10MG'$  AND '5MG'$ ) Take as directed on package: start with two-'5mg'$  tablets twice daily for 7 days. On day 8, switch to one-'5mg'$  tablet  twice daily. 05/19/22   Donne Hazel, MD  atorvastatin (LIPITOR) 40 MG tablet Take 1 tablet (40 mg total) by mouth daily. 06/07/22   [name], MD  azithromycin (ZITHROMAX) 250 MG tablet Take 1 tablet (250 mg total) by mouth daily. Take first 2 tablets together, then 1 every day until finished. 06/30/22   Vevelyn Francois, NP  brimonidine (ALPHAGAN) 0.2 % ophthalmic solution Place 1 drop into both eyes daily. 07/25/21   [provider]  cephALEXin (KEFLEX) 500 MG capsule Take 1 capsule (500 mg total) by mouth 4 (four) times daily. 06/07/22   [name], MD  ciprofloxacin (CIPRO) 250 MG tablet Take 250 mg by mouth 2 (two) times daily. 05/18/22   [provider]  dicyclomine (BENTYL) 10 MG capsule Take 10 mg by mouth daily as needed (pain).    [provider]  dorzolamide-timolol (COSOPT) 22.3-6.8 MG/ML ophthalmic solution Place 1 drop into both eyes daily in the afternoon.    [provider]  ferrous sulfate (FEROSUL) 325 (65 FE) MG tablet Take 1 tablet (325 mg total) by mouth 2 (two) times daily with a meal. 06/08/21   McClung, Dionne Bucy, PA-C  finasteride (PROSCAR) 5 MG tablet Take 5 mg by mouth daily. 12/13/21   [provider]  fluticasone (FLONASE) 50 MCG/ACT nasal spray Place 1 spray into both nostrils daily.    [provider]  fluticasone furoate-vilanterol (BREO ELLIPTA) 200-25 MCG/ACT AEPB Inhale 2 puffs into the lungs daily.    [provider]  glycopyrrolate (ROBINUL) 1 MG tablet Take 1 tablet (1 mg total) by mouth 2 times daily. 06/07/22   [name], MD  guaiFENesin (MUCINEX) 600 MG 12 hr tablet Take 600 mg by mouth 2 (two) times daily.    [provider]  hydrALAZINE (APRESOLINE) 25 MG tablet Take 1 tablet (25 mg total) by mouth 3 (three) times daily. 04/25/22   Shirley Friar, PA-C  hydrocortisone (ANUSOL-HC) 2.5 % rectal cream Place 1 Application rectally 2 (two) times daily. 06/30/22   Vevelyn Francois, NP  latanoprost (XALATAN) 0.005 % ophthalmic solution Place 1 drop into both eyes at bedtime. 07/25/21   [provider]  levETIRAcetam (KEPPRA) 500 MG tablet Take 1 tablet (500 mg total) by mouth 2 (two) times daily. 12/19/21   Penumalli, Earlean Polka, MD  metoprolol succinate (TOPROL-XL) 50 MG 24 hr tablet Take 1 tablet (50 mg total) by mouth at bedtime. Take with or immediately following a meal. 04/04/22   Camnitz, Ocie Doyne, MD  montelukast (SINGULAIR) 10 MG tablet Take 10 mg by mouth at bedtime. 08/29/21   [provider]  montelukast (SINGULAIR) 10 MG tablet Take 1 tablet by mouth at bedtime. 06/19/22   [provider]  Multiple Vitamin (MULTI-VITAMIN) tablet Take 1 tablet by mouth daily.    [provider]  nystatin (MYCOSTATIN/NYSTOP) powder SMARTSIG:1 Topical Daily 06/23/22   [provider]  ondansetron (ZOFRAN) 4 MG tablet Take 1 tablet (4 mg total) by mouth every 8 (eight) hours as needed for nausea or vomiting. 05/12/22   [name], MD  polyethylene glycol (MIRALAX) 17 g packet Take 17 g by mouth daily. 10/11/21   Shelly Coss, MD  polyethylene glycol powder (GLYCOLAX/MIRALAX) 17 GM/SCOOP powder Take 17 g by mouth daily. Follow instructions in the bottle. Use no more than 7 days. PRN for occasional constipation. 06/30/22 07/30/22  Vevelyn Francois, NP  tamsulosin (FLOMAX) 0.4 MG CAPS capsule Take 1 capsule (0.4 mg total) by mouth daily. 01/24/22   [name], MD  torsemide (DEMADEX) 20 MG tablet Take 20 mg by mouth daily as needed (fluid).    [provider]      Allergies    Ace inhibitors, Doxycycline, Atacand hct [candesartan cilexetil-hctz], and Shellfish allergy    Review of Systems   Review of Systems  Respiratory:  Positive for cough.   Neurological:  Positive for weakness.  All other systems reviewed and are negative.   Physical Exam Updated Vital Signs BP (!) 157/77 (BP Location: Left Arm)   Pulse 62   Temp  98.1 F (36.7 C) (Oral)   Resp 16   Ht '6\' 3"'$  (1.905 m)   Wt 81.6 kg   SpO2 99%   BMI 22.50 kg/m   Physical Exam Vitals and nursing note reviewed.  Constitutional:      Appearance: He is well-developed.     Comments: Appears weak, elderly  HENT:     Head: Normocephalic and atraumatic.  Eyes:     Conjunctiva/sclera: Conjunctivae normal.     Pupils: Pupils are equal, round, and reactive to light.  Cardiovascular:     Rate and Rhythm: Normal rate and regular rhythm.     Heart sounds: Normal heart sounds.  Pulmonary:     Effort: Pulmonary effort is normal. No respiratory distress.     Breath sounds: Normal breath sounds. No rhonchi.  Abdominal:     General: Bowel sounds are normal.     Palpations: Abdomen is soft.     Tenderness: There is no abdominal tenderness. There is no rebound.  Musculoskeletal:        General: Normal range of motion.     Cervical back: Normal range of motion.  Skin:    General: Skin is warm and dry.  Neurological:     Mental Status: He is alert.     Comments: Awake alert, oriented to situation and place, able to answer questions and follow commands, globally weak on exam     ED Results / Procedures / Treatments   Labs (all labs ordered are listed, but only abnormal results are displayed) Labs Reviewed  CBC WITH DIFFERENTIAL/PLATELET - Abnormal; Notable for the following components:      Result Value   RBC 3.11 (*)    Hemoglobin 8.9 (*)    HCT 28.0 (*)    All other components within normal limits  COMPREHENSIVE METABOLIC PANEL - Abnormal; Notable for the following components:   Potassium 6.2 (*)    CO2 21 (*)    BUN 51 (*)    Creatinine, Ser 2.80 (*)    Total Protein 6.0 (*)    Albumin 2.9 (*)    GFR, Estimated 24 (*)    All other components within normal limits  URINALYSIS, ROUTINE W REFLEX MICROSCOPIC - Abnormal; Notable for the following components:   APPearance CLOUDY (*)    Hgb urine dipstick LARGE (*)    Protein, ur 30 (*)     Nitrite POSITIVE (*)    Leukocytes,Ua LARGE (*)    RBC / HPF >50 (*)    WBC, UA >50 (*)    Bacteria, UA MANY (*)    All other components within normal limits  POTASSIUM - Abnormal; Notable for the following components:   Potassium 5.8 (*)    All other components within normal limits  RESP PANEL BY RT-PCR (FLU A&B, COVID) ARPGX2  CULTURE, BLOOD (ROUTINE X 2)  CULTURE, BLOOD (ROUTINE X 2)  URINE CULTURE  LACTIC ACID, PLASMA    EKG None  Radiology DG Chest Port 1 View  Result Date: 07/05/2022 CLINICAL DATA:  Recent pneumonia, weakness EXAM: PORTABLE CHEST 1 VIEW COMPARISON:  Chest x-ray 10 02/2022 FINDINGS: Left pacemaker is unchanged in position. The heart is enlarged, unchanged. There is some minimal patchy opacities in the left mid lung. The lungs are otherwise clear. No pleural effusion or pneumothorax. No acute fractures. IMPRESSION: 1. Minimal patchy opacities in the left mid lung, which may represent atelectasis or infection. 2. Stable cardiomegaly. Electronically Signed   By: Ronney Asters M.D.   On: 07/05/2022 23:12    Procedures Procedures    CRITICAL CARE Performed by: Larene Pickett   Total critical care time: 40 minutes  Critical care time was exclusive of separately billable procedures and treating other patients.  Critical care was necessary to treat or prevent imminent or life-threatening deterioration.  Critical care was time spent personally by me on the following activities: development of treatment plan with patient and/or surrogate as well as nursing, discussions with consultants, evaluation of patient's response to treatment, examination of patient, obtaining history from patient or surrogate, ordering and performing treatments and interventions, ordering and review of laboratory studies, ordering and review of radiographic studies, pulse oximetry and re-evaluation of patient's condition.   Medications Ordered in ED Medications  vancomycin (VANCOREADY)  IVPB 1750 mg/350 mL (1,750 mg  Intravenous New Bag/Given 07/06/22 0053)  sodium zirconium cyclosilicate (LOKELMA) packet 5 g (has no administration in time range)  ceFEPIme (MAXIPIME) 2 g in sodium chloride 0.9 % 100 mL IVPB (0 g Intravenous Stopped 07/06/22 0051)    ED Course/ Medical Decision Making/ A&P                           Medical Decision Making Amount and/or Complexity of Data Reviewed Labs: ordered. Radiology: ordered and independent interpretation performed. ECG/medicine tests: ordered and independent interpretation performed.  Risk Prescription drug management. Decision regarding hospitalization.   71 year old male presenting to the ED with wife for generalized weakness.  He was diagnosed with pneumonia on Friday 06/30/22 and started on azithromycin, wife reports he continues to worsen.  He has not had any fevers.  She reports he has seemed more tired and "not like himself today" but is not frankly been confused.  On my exam he is awake, able to answer questions and follow commands.  He is aware of self and situation but confused regarding time.  He does appear globally weak.  He is hemodynamically stable.  Does have wet cough and junky breath sounds on auscultation.  Concern for persistent pneumonia.  We will repeat chest x-ray, labs sent to screen for sepsis including lactate, cultures.  Labs as above--no leukocytosis, normal lactate.  Kidney function actually improved from prior with creatinine of 2.8 today.  Does have some hyperkalemia at 6.2, this was repeated and is confirmed at 5.8 without any noted hemolysis.  It appears he usually runs a little high based on prior values.  EKG without peaked t-waves.  Will give dose of lokelma.  UA nitrite +, culture pending.  CXR also with persistent PNA.  Will expand coverage, started vanc/cefepime.  Will admit for ongoing care.  Wife updated, agreeable with plan.  Discussed with hospitalist, Dr. Alcario Drought-- will admit for ongoing  care.  Final Clinical Impression(s) / ED Diagnoses Final diagnoses:  Community acquired pneumonia of left lower lobe of lung  Acute cystitis without hematuria  Hyperkalemia    Rx / DC Orders ED Discharge Orders     None         Larene Pickett, PA-C 07/06/22 0259    Fransico Meadow, MD 07/06/22 [phone]

## 2022-07-05 NOTE — ED Triage Notes (Signed)
Pt to ED via [facility] from home.  Pt's wife reported to EMS that pt has not been acting like himself the past few days and has had a "junky" cough.  Pt recently diagnosed with UTI x 2 weeks and pna 3 days ago.  Pt alert to self, place and situation, disoriented to time. Pt has non-productive cough. Pt states he is tired.   EMS VS: 156/78 HR=60 Cbg=111 99% RA

## 2022-07-06 ENCOUNTER — Institutional Professional Consult (permissible substitution): Payer: Medicare HMO | Admitting: Pulmonary Disease

## 2022-07-06 DIAGNOSIS — G1221 Amyotrophic lateral sclerosis: Secondary | ICD-10-CM | POA: Diagnosis not present

## 2022-07-06 DIAGNOSIS — Z825 Family history of asthma and other chronic lower respiratory diseases: Secondary | ICD-10-CM | POA: Diagnosis not present

## 2022-07-06 DIAGNOSIS — N3000 Acute cystitis without hematuria: Secondary | ICD-10-CM

## 2022-07-06 DIAGNOSIS — Z8616 Personal history of COVID-19: Secondary | ICD-10-CM | POA: Diagnosis not present

## 2022-07-06 DIAGNOSIS — T83511A Infection and inflammatory reaction due to indwelling urethral catheter, initial encounter: Secondary | ICD-10-CM | POA: Diagnosis not present

## 2022-07-06 DIAGNOSIS — Y846 Urinary catheterization as the cause of abnormal reaction of the patient, or of later complication, without mention of misadventure at the time of the procedure: Secondary | ICD-10-CM | POA: Diagnosis not present

## 2022-07-06 DIAGNOSIS — I82402 Acute embolism and thrombosis of unspecified deep veins of left lower extremity: Secondary | ICD-10-CM | POA: Diagnosis not present

## 2022-07-06 DIAGNOSIS — G40909 Epilepsy, unspecified, not intractable, without status epilepticus: Secondary | ICD-10-CM

## 2022-07-06 DIAGNOSIS — R1310 Dysphagia, unspecified: Secondary | ICD-10-CM

## 2022-07-06 DIAGNOSIS — E875 Hyperkalemia: Secondary | ICD-10-CM | POA: Diagnosis not present

## 2022-07-06 DIAGNOSIS — Z515 Encounter for palliative care: Secondary | ICD-10-CM | POA: Diagnosis not present

## 2022-07-06 DIAGNOSIS — I129 Hypertensive chronic kidney disease with stage 1 through stage 4 chronic kidney disease, or unspecified chronic kidney disease: Secondary | ICD-10-CM | POA: Diagnosis not present

## 2022-07-06 DIAGNOSIS — Z8249 Family history of ischemic heart disease and other diseases of the circulatory system: Secondary | ICD-10-CM | POA: Diagnosis not present

## 2022-07-06 DIAGNOSIS — E7800 Pure hypercholesterolemia, unspecified: Secondary | ICD-10-CM | POA: Diagnosis not present

## 2022-07-06 DIAGNOSIS — Z86718 Personal history of other venous thrombosis and embolism: Secondary | ICD-10-CM | POA: Diagnosis not present

## 2022-07-06 DIAGNOSIS — N390 Urinary tract infection, site not specified: Secondary | ICD-10-CM

## 2022-07-06 DIAGNOSIS — F17210 Nicotine dependence, cigarettes, uncomplicated: Secondary | ICD-10-CM | POA: Diagnosis not present

## 2022-07-06 DIAGNOSIS — R339 Retention of urine, unspecified: Secondary | ICD-10-CM | POA: Diagnosis present

## 2022-07-06 DIAGNOSIS — G9341 Metabolic encephalopathy: Secondary | ICD-10-CM | POA: Diagnosis not present

## 2022-07-06 DIAGNOSIS — J690 Pneumonitis due to inhalation of food and vomit: Secondary | ICD-10-CM | POA: Diagnosis not present

## 2022-07-06 DIAGNOSIS — D631 Anemia in chronic kidney disease: Secondary | ICD-10-CM | POA: Diagnosis not present

## 2022-07-06 DIAGNOSIS — D638 Anemia in other chronic diseases classified elsewhere: Secondary | ICD-10-CM

## 2022-07-06 DIAGNOSIS — Z7401 Bed confinement status: Secondary | ICD-10-CM | POA: Diagnosis not present

## 2022-07-06 DIAGNOSIS — Z809 Family history of malignant neoplasm, unspecified: Secondary | ICD-10-CM | POA: Diagnosis not present

## 2022-07-06 DIAGNOSIS — Z66 Do not resuscitate: Secondary | ICD-10-CM

## 2022-07-06 DIAGNOSIS — N319 Neuromuscular dysfunction of bladder, unspecified: Secondary | ICD-10-CM | POA: Diagnosis not present

## 2022-07-06 DIAGNOSIS — N184 Chronic kidney disease, stage 4 (severe): Secondary | ICD-10-CM

## 2022-07-06 DIAGNOSIS — Z7189 Other specified counseling: Secondary | ICD-10-CM | POA: Diagnosis not present

## 2022-07-06 DIAGNOSIS — Z79899 Other long term (current) drug therapy: Secondary | ICD-10-CM | POA: Diagnosis not present

## 2022-07-06 DIAGNOSIS — T83518A Infection and inflammatory reaction due to other urinary catheter, initial encounter: Secondary | ICD-10-CM | POA: Diagnosis not present

## 2022-07-06 DIAGNOSIS — Z20822 Contact with and (suspected) exposure to covid-19: Secondary | ICD-10-CM | POA: Diagnosis not present

## 2022-07-06 DIAGNOSIS — Z7901 Long term (current) use of anticoagulants: Secondary | ICD-10-CM | POA: Diagnosis not present

## 2022-07-06 LAB — BASIC METABOLIC PANEL WITH GFR
Anion gap: 10 (ref 5–15)
BUN: 48 mg/dL — ABNORMAL HIGH (ref 8–23)
CO2: 18 mmol/L — ABNORMAL LOW (ref 22–32)
Calcium: 9.9 mg/dL (ref 8.9–10.3)
Chloride: 112 mmol/L — ABNORMAL HIGH (ref 98–111)
Creatinine, Ser: 2.65 mg/dL — ABNORMAL HIGH (ref 0.61–1.24)
GFR, Estimated: 25 mL/min — ABNORMAL LOW
Glucose, Bld: 96 mg/dL (ref 70–99)
Potassium: 6.1 mmol/L — ABNORMAL HIGH (ref 3.5–5.1)
Sodium: 140 mmol/L (ref 135–145)

## 2022-07-06 LAB — CBC
HCT: 28.7 % — ABNORMAL LOW (ref 39.0–52.0)
Hemoglobin: 9.1 g/dL — ABNORMAL LOW (ref 13.0–17.0)
MCH: 28.6 pg (ref 26.0–34.0)
MCHC: 31.7 g/dL (ref 30.0–36.0)
MCV: 90.3 fL (ref 80.0–100.0)
Platelets: 266 10*3/uL (ref 150–400)
RBC: 3.18 MIL/uL — ABNORMAL LOW (ref 4.22–5.81)
RDW: 14.2 % (ref 11.5–15.5)
WBC: 9.6 10*3/uL (ref 4.0–10.5)
nRBC: 0 % (ref 0.0–0.2)

## 2022-07-06 LAB — MRSA NEXT GEN BY PCR, NASAL: MRSA by PCR Next Gen: NOT DETECTED

## 2022-07-06 LAB — POTASSIUM: Potassium: 5.8 mmol/L — ABNORMAL HIGH (ref 3.5–5.1)

## 2022-07-06 LAB — HIV ANTIBODY (ROUTINE TESTING W REFLEX): HIV Screen 4th Generation wRfx: NONREACTIVE

## 2022-07-06 LAB — STREP PNEUMONIAE URINARY ANTIGEN: Strep Pneumo Urinary Antigen: NEGATIVE

## 2022-07-06 MED ORDER — ALBUTEROL SULFATE (2.5 MG/3ML) 0.083% IN NEBU: 2.5000 mg | INHALATION_SOLUTION | RESPIRATORY_TRACT | Status: DC | PRN

## 2022-07-06 MED ORDER — FINASTERIDE 5 MG PO TABS
5.0000 mg | ORAL_TABLET | Freq: Every day | ORAL | Status: DC
  Administered 2022-07-06 – 2022-07-09 (×4): 5 mg via ORAL
  Filled 2022-07-06 (×4): qty 1

## 2022-07-06 MED ORDER — GLYCOPYRROLATE 1 MG PO TABS
1.0000 mg | ORAL_TABLET | Freq: Two times a day (BID) | ORAL | Status: DC
  Administered 2022-07-06 – 2022-07-09 (×7): 1 mg via ORAL
  Filled 2022-07-06 (×9): qty 1

## 2022-07-06 MED ORDER — SODIUM CHLORIDE 0.9 % IV SOLN
2.0000 g | INTRAVENOUS | Status: DC
  Administered 2022-07-06 – 2022-07-07 (×2): 2 g via INTRAVENOUS
  Filled 2022-07-06 (×2): qty 20

## 2022-07-06 MED ORDER — SODIUM CHLORIDE 0.9 % IV SOLN: INTRAVENOUS | Status: DC

## 2022-07-06 MED ORDER — FERROUS SULFATE 325 (65 FE) MG PO TABS
325.0000 mg | ORAL_TABLET | Freq: Two times a day (BID) | ORAL | Status: DC
  Administered 2022-07-06 – 2022-07-09 (×7): 325 mg via ORAL
  Filled 2022-07-06 (×7): qty 1

## 2022-07-06 MED ORDER — SODIUM CHLORIDE 0.9 % IV SOLN
500.0000 mg | INTRAVENOUS | Status: DC
  Administered 2022-07-06 – 2022-07-09 (×4): 500 mg via INTRAVENOUS
  Filled 2022-07-06 (×5): qty 5

## 2022-07-06 MED ORDER — LEVETIRACETAM IN NACL 500 MG/100ML IV SOLN
500.0000 mg | Freq: Two times a day (BID) | INTRAVENOUS | Status: DC
  Administered 2022-07-06 – 2022-07-09 (×7): 500 mg via INTRAVENOUS
  Filled 2022-07-06 (×10): qty 100

## 2022-07-06 MED ORDER — FLUTICASONE PROPIONATE 50 MCG/ACT NA SUSP: 1.0000 | Freq: Every day | NASAL | Status: DC | PRN

## 2022-07-06 MED ORDER — GUAIFENESIN ER 600 MG PO TB12
600.0000 mg | ORAL_TABLET | Freq: Every day | ORAL | Status: DC
  Administered 2022-07-06 – 2022-07-09 (×4): 600 mg via ORAL
  Filled 2022-07-06 (×4): qty 1

## 2022-07-06 MED ORDER — DORZOLAMIDE HCL-TIMOLOL MAL 2-0.5 % OP SOLN
1.0000 [drp] | Freq: Every day | OPHTHALMIC | Status: DC
  Administered 2022-07-06 – 2022-07-09 (×4): 1 [drp] via OPHTHALMIC
  Filled 2022-07-06 (×2): qty 10

## 2022-07-06 MED ORDER — FLUTICASONE FUROATE-VILANTEROL 200-25 MCG/ACT IN AEPB
2.0000 | INHALATION_SPRAY | Freq: Every day | RESPIRATORY_TRACT | Status: DC
  Administered 2022-07-06 – 2022-07-09 (×3): 2 via RESPIRATORY_TRACT
  Filled 2022-07-06 (×2): qty 28

## 2022-07-06 MED ORDER — ZINC OXIDE 12.8 % EX OINT
TOPICAL_OINTMENT | Freq: Every day | CUTANEOUS | Status: DC
  Filled 2022-07-06: qty 56.7

## 2022-07-06 MED ORDER — AMLODIPINE BESYLATE 10 MG PO TABS
10.0000 mg | ORAL_TABLET | Freq: Every day | ORAL | Status: DC
  Administered 2022-07-06 – 2022-07-09 (×4): 10 mg via ORAL
  Filled 2022-07-06 (×3): qty 1
  Filled 2022-07-06: qty 2

## 2022-07-06 MED ORDER — METOPROLOL SUCCINATE ER 25 MG PO TB24
50.0000 mg | ORAL_TABLET | Freq: Every day | ORAL | Status: DC
  Administered 2022-07-06 – 2022-07-08 (×3): 50 mg via ORAL
  Filled 2022-07-06 (×3): qty 2

## 2022-07-06 MED ORDER — ATORVASTATIN CALCIUM 40 MG PO TABS
40.0000 mg | ORAL_TABLET | Freq: Every day | ORAL | Status: DC
  Administered 2022-07-06 – 2022-07-09 (×4): 40 mg via ORAL
  Filled 2022-07-06 (×4): qty 1

## 2022-07-06 MED ORDER — POLYETHYLENE GLYCOL 3350 17 G PO PACK: 17.0000 g | PACK | Freq: Every day | ORAL | Status: DC | PRN

## 2022-07-06 MED ORDER — DICYCLOMINE HCL 10 MG PO CAPS: 10.0000 mg | ORAL_CAPSULE | Freq: Every day | ORAL | Status: DC | PRN

## 2022-07-06 MED ORDER — HYDRALAZINE HCL 25 MG PO TABS
25.0000 mg | ORAL_TABLET | Freq: Three times a day (TID) | ORAL | Status: DC
  Administered 2022-07-06 – 2022-07-09 (×10): 25 mg via ORAL
  Filled 2022-07-06 (×10): qty 1

## 2022-07-06 MED ORDER — MONTELUKAST SODIUM 10 MG PO TABS
10.0000 mg | ORAL_TABLET | Freq: Every day | ORAL | Status: DC
  Administered 2022-07-06 – 2022-07-08 (×3): 10 mg via ORAL
  Filled 2022-07-06 (×3): qty 1

## 2022-07-06 MED ORDER — ADULT MULTIVITAMIN W/MINERALS CH
1.0000 | ORAL_TABLET | Freq: Every day | ORAL | Status: DC
  Administered 2022-07-06 – 2022-07-09 (×4): 1 via ORAL
  Filled 2022-07-06 (×4): qty 1

## 2022-07-06 MED ORDER — LATANOPROST 0.005 % OP SOLN
1.0000 [drp] | Freq: Every day | OPHTHALMIC | Status: DC
  Administered 2022-07-06 – 2022-07-08 (×3): 1 [drp] via OPHTHALMIC
  Filled 2022-07-06: qty 2.5

## 2022-07-06 MED ORDER — TAMSULOSIN HCL 0.4 MG PO CAPS
0.4000 mg | ORAL_CAPSULE | Freq: Every day | ORAL | Status: DC
  Administered 2022-07-06 – 2022-07-09 (×4): 0.4 mg via ORAL
  Filled 2022-07-06 (×4): qty 1

## 2022-07-06 MED ORDER — APIXABAN 5 MG PO TABS
5.0000 mg | ORAL_TABLET | Freq: Two times a day (BID) | ORAL | Status: DC
  Administered 2022-07-06 – 2022-07-09 (×7): 5 mg via ORAL
  Filled 2022-07-06 (×8): qty 1

## 2022-07-06 MED ORDER — BRIMONIDINE TARTRATE 0.2 % OP SOLN
1.0000 [drp] | Freq: Every day | OPHTHALMIC | Status: DC
  Administered 2022-07-06 – 2022-07-09 (×4): 1 [drp] via OPHTHALMIC
  Filled 2022-07-06 (×2): qty 5

## 2022-07-06 MED ORDER — SODIUM ZIRCONIUM CYCLOSILICATE 10 G PO PACK
10.0000 g | PACK | Freq: Once | ORAL | Status: AC
  Administered 2022-07-06: 10 g via ORAL
  Filled 2022-07-06: qty 1

## 2022-07-06 MED ORDER — ENOXAPARIN SODIUM 40 MG/0.4ML IJ SOSY: 40.0000 mg | PREFILLED_SYRINGE | INTRAMUSCULAR | Status: DC

## 2022-07-06 MED ORDER — SODIUM ZIRCONIUM CYCLOSILICATE 5 G PO PACK
5.0000 g | PACK | Freq: Once | ORAL | Status: AC
  Administered 2022-07-06: 5 g via ORAL
  Filled 2022-07-06: qty 1

## 2022-07-06 NOTE — Progress Notes (Signed)
Pt transfer from ED, wife at bedside. Pt sleepy but arousal, vitals stable.Call button, place place, and bed in lowest position.

## 2022-07-06 NOTE — Assessment & Plan Note (Signed)
Due to PNA + UTI.

## 2022-07-06 NOTE — Assessment & Plan Note (Signed)
History of DVT Continue eliquis

## 2022-07-06 NOTE — Assessment & Plan Note (Signed)
Creat today stable from baseline. Does have hyperK though, see above.

## 2022-07-06 NOTE — Assessment & Plan Note (Signed)
Continue keppra. Will order the keppra IV given AMS at the moment, just to make sure he gets it.

## 2022-07-06 NOTE — Assessment & Plan Note (Addendum)
Replace foley given CAUTI. Retention due to: 1) BPH 2) ? Neurogenic bladder from ALS (not formally diagnosed to have neurogenic bladder but has advanced ALS so wouldn't be surprising).  Continue Flomax and Proscar. Follow up with urology, though again, I question if neurogenic bladder playing a role here.

## 2022-07-06 NOTE — Assessment & Plan Note (Signed)
Lokelma in ED K 5.8 on follow up Next BMP at 0500 this AM Tele monitor Normal saline IVF

## 2022-07-06 NOTE — H&P (Signed)
History and Physical    Patient: Darrell [name]:[id number] DOB: 1950-10-11 DOA: 07/05/2022 DOS: the patient was seen and examined on 07/06/2022 PCP: [name], MD  Patient coming from: Home  Chief Complaint:  Chief Complaint  Patient presents with   Altered Mental Status   HPI: Darrell [name] is a 71 y.o. male with medical history significant of prior stroke, ALS, anemia, hypertension chronic kidney disease stage IV, seizure disorder, presenting to the ED with family for generalized weakness.  Wife reports they were seen at urgent care on Friday due to some rectal pain due to hemorrhoids.  At that time he was noted to have a cough, chest x-ray was done concerning for pneumonia.  He was started on azithromycin.  Wife reports since Friday he continues to decline, now very generally weak and sleeping most of the time.  He is drinking Ensure or boost.  Had urinary retention recently requiring foley placement, foley still in place and they are going to see if it can be removed during follow up office visit when they see urology next.  He is on pureed diet with thickened liquids, but doesn't generally use the thickener.  From [place] [facility] note from 1 month ago it sounds like he is aspirating nearly everything and so he is eating less at this point: "When seen today, he remains bedbound and total care.  He has a bit more dysphagia with some coughing spells.  He is less inclined to eat as much."  No vomiting, no diarrhea.  Has wet sounding cough.  Has bed sore but it doesn't look bad per wife. Review of Systems: As mentioned in the history of present illness. All other systems reviewed and are negative. Past Medical History:  Diagnosis Date   Arthritis    Asthma    At high risk for falls 08/16/2015   CIDP (chronic inflammatory demyelinating polyneuropathy) ([name])    CKD (chronic kidney disease) stage 3, GFR 30-59 ml/min ([name]) 08/16/2015   Dysphagia as late effect of cerebrovascular  disease    pts wife states pt has to eat soft foods    Elevated liver enzymes 08/10/2016   GERD (gastroesophageal reflux disease)    Glaucoma    High cholesterol    History of CVA with residual deficit 03/25/2013   Hypertension    Hypertensive retinopathy of both eyes 01/16/2017   Inguinal hernia 03/25/2013   Liver hemangioma 08/14/2016   New onset seizure ([place]) 07/08/2017   seizure 07/14/18   Nuclear sclerosis of both eyes 10/25/2016   Pneumonia    Presence of permanent cardiac pacemaker    Primary open angle glaucoma of both eyes, indeterminate stage 10/25/2016   Renal mass, right 08/14/2016   Status cardiac pacemaker 01/29/2017   Placed for second degree heart block on 01/16/17 Medtronic Azure XT DR MRI SureScan dual-chamber pacemaker   Stroke [facility])    2011 with residual deficit left sided weakness   Tobacco dependence    Past Surgical History:  Procedure Laterality Date   EYE SURGERY     HERNIA REPAIR     INGUINAL HERNIA REPAIR Right 11/23/2014   Procedure: right inguinal hernia repair with mesh;  Surgeon: Armandina Gemma, MD;  Location: [facility];  Service: General;  Laterality: Right;   INSERTION OF MESH N/A 11/23/2014   Procedure: INSERTION OF MESH;  Surgeon: Armandina Gemma, MD;  Location: [facility];  Service: General;  Laterality: N/A;   JOINT REPLACEMENT     MASS EXCISION Left 08/29/2017  Procedure: EXCISION OF LEFT NECK MASS;  Surgeon: Coralie Keens, MD;  Location: [place];  Service: General;  Laterality: Left;   PACEMAKER IMPLANT N/A 01/16/2017   Procedure: Pacemaker Implant;  Surgeon: Will Meredith Leeds, MD;  Location: [place] [facility];  Service: Cardiovascular;  Laterality: N/A;   SHOULDER SURGERY Bilateral 1988, 1998   Social History:  reports that he has been smoking cigarettes. He has a 40.00 pack-year smoking history. He has never used smokeless tobacco. He reports that he does not drink alcohol and does not use drugs.  Allergies  Allergen Reactions   Ace  Inhibitors Other (See Comments)    Hyperkalemia   Doxycycline Other (See Comments)    Hiccups, cough, nausea and emesis, elevated liver enzymes, elevated eosinophils, SOB concerning for early DRESS syndrome    [place]and Hct [Candesartan Cilexetil-Hctz] Hives   Shellfish Allergy Hives    Family History  Problem Relation Age of Onset   Hypertension Mother    Diabetes Sister    Hypertension Sister    Cancer Sister        1 sister   COPD Sister        in 1 sister   Stomach cancer Neg Hx    Colon cancer Neg Hx    Pancreatic cancer Neg Hx    Esophageal cancer Neg Hx     Prior to Admission medications   Medication Sig Start Date End Date Taking? Authorizing Provider  acetaminophen (TYLENOL) 325 MG tablet Take 325 mg by mouth daily as needed for mild pain or moderate pain.   Yes [provider]  albuterol (VENTOLIN HFA) 108 (90 Base) MCG/ACT inhaler Inhale 2 puffs into the lungs every 6 (six) hours as needed for up to 30 days for Wheezing. 06/08/21  Yes Freeman Caldron M, PA-C  amLODipine (NORVASC) 10 MG tablet Take 1 tablet (10 mg total) by mouth daily. 06/07/22  Yes [name], MD  apixaban (ELIQUIS) 5 MG TABS tablet Take 1 tablet (5 mg total) by mouth 2 (two) times daily. Upon completion of starter pack 06/07/22  Yes Newlin, Enobong, MD  atorvastatin (LIPITOR) 40 MG tablet Take 1 tablet (40 mg total) by mouth daily. 06/07/22  Yes [name], MD  azithromycin (ZITHROMAX) 250 MG tablet Take 1 tablet (250 mg total) by mouth daily. Take first 2 tablets together, then 1 every day until finished. 06/30/22  Yes King, Diona Foley, NP  brimonidine (ALPHAGAN) 0.2 % ophthalmic solution Place 1 drop into both eyes daily. 07/25/21  Yes [provider]  cetirizine (ZYRTEC) 10 MG tablet Take 10 mg by mouth daily as needed for allergies.   Yes [provider]  dicyclomine (BENTYL) 10 MG capsule Take 10 mg by mouth daily as needed for spasms (pain).   Yes [provider]  dorzolamide-timolol (COSOPT) 22.3-6.8 MG/ML ophthalmic solution Place 1 drop into both eyes daily.   Yes [provider]  ferrous sulfate (FEROSUL) 325 (65 FE) MG tablet Take 1 tablet (325 mg total) by mouth 2 (two) times daily with a meal. 06/08/21  Yes McClung, Angela M, PA-C  finasteride (PROSCAR) 5 MG tablet Take 5 mg by mouth daily. 12/13/21  Yes [provider]  fluticasone (FLONASE) 50 MCG/ACT nasal spray Place 1 spray into both nostrils daily as needed for allergies.   Yes [provider]  fluticasone furoate-vilanterol (BREO ELLIPTA) 200-25 MCG/ACT AEPB Inhale 2 puffs into the lungs daily as needed (for shortness of breath).   Yes [provider]  glycopyrrolate (ROBINUL) 1 MG tablet Take 1 tablet (1 mg total) by mouth 2 times daily. 06/07/22  Yes [name], MD  guaiFENesin (MUCINEX) 600 MG 12 hr tablet Take 600 mg by mouth daily.   Yes [provider]  hydrALAZINE (APRESOLINE) 25 MG tablet Take 1 tablet (25 mg total) by mouth 3 (three) times daily. 04/25/22  Yes Shirley Friar, PA-C  hydrocortisone (ANUSOL-HC) 2.5 % rectal cream Place 1 Application rectally 2 (two) times daily. 06/30/22  Yes King, Diona Foley, NP  latanoprost (XALATAN) 0.005 % ophthalmic solution Place 1 drop into both eyes at bedtime. 07/25/21  Yes [provider]  levETIRAcetam (KEPPRA) 500 MG tablet Take 1 tablet (500 mg total) by mouth 2 (two) times daily. 12/19/21  Yes Penumalli, Earlean Polka, MD  metoprolol succinate (TOPROL-XL) 50 MG 24 hr tablet Take 1 tablet (50 mg total) by mouth at bedtime. Take with or immediately following a meal. 04/04/22  Yes Camnitz, Will Hassell Done, MD  montelukast (SINGULAIR) 10 MG tablet Take 1 tablet by mouth at bedtime. 06/19/22  Yes [provider]  Multiple Vitamin (MULTI-VITAMIN) tablet Take 1 tablet by mouth daily.   Yes [provider]  polyethylene glycol powder (GLYCOLAX/MIRALAX) 17 GM/SCOOP powder Take 17 g  by mouth daily. Follow instructions in the bottle. Use no more than 7 days. PRN for occasional constipation. 06/30/22 07/30/22 Yes Vevelyn Francois, NP  tamsulosin (FLOMAX) 0.4 MG CAPS capsule Take 1 capsule (0.4 mg total) by mouth daily. 01/24/22  Yes [name], MD  torsemide (DEMADEX) 20 MG tablet Take 20 mg by mouth daily as needed (fluid).   Yes [provider]  ondansetron (ZOFRAN) 4 MG tablet Take 1 tablet (4 mg total) by mouth every 8 (eight) hours as needed for nausea or vomiting. 05/12/22   [name], MD    Physical Exam: Vitals:   07/06/22 0045 07/06/22 0100 07/06/22 0130 07/06/22 0200  BP: (!) 154/78 (!) 149/77 132/82 (!) 150/84  Pulse: 60 61 60 60  Resp: '12 16 15 14  '$ Temp:      TempSrc:      SpO2: 97% 96% 98% 98%  Weight:      Height:       Constitutional: Elderly, weak Eyes: Legally blind ENMT: Mucous membranes are moist. Posterior pharynx clear of any exudate or lesions.Normal dentition.  Neck: normal, supple, no masses, no thyromegaly Respiratory: Wet sounding cough  Cardiovascular: Regular rate and rhythm, no murmurs / rubs / gallops. No extremity edema. 2+ pedal pulses. No carotid bruits.  Abdomen: no tenderness, no masses palpated. No hepatosplenomegaly. Bowel sounds positive.  Musculoskeletal: Foot drop present, Muscle wasting present. Skin: no rashes, lesions, ulcers. No induration Neurologic: Weakness in all 4 extremities with muscle wasting.  Foot drop. Psychiatric: Normal judgment and insight. Alert and oriented x 3. Normal mood.   Data Reviewed:       Latest Ref Rng & Units 07/05/2022   10:40 PM 05/19/2022    3:06 AM 05/18/2022    3:07 AM  CBC  WBC 4.0 - 10.5 K/uL 8.8  8.3  10.7   Hemoglobin 13.0 - 17.0 g/dL 8.9  8.1  8.8   Hematocrit 39.0 - 52.0 % 28.0  24.8  27.1   Platelets 150 - 400 K/uL 287  185  198    CMP     Component Value Date/Time   NA 138 07/05/2022 2240   NA 141 10/27/2021 1115   K 5.8 (H) 07/05/2022 2355  CL 111  07/05/2022 2240   CO2 21 (L) 07/05/2022 2240   GLUCOSE 98 07/05/2022 2240   BUN 51 (H) 07/05/2022 2240   BUN 28 (H) 10/27/2021 1115   CREATININE 2.80 (H) 07/05/2022 2240   CREATININE 1.45 (H) 08/10/2016 1647   CALCIUM 9.4 07/05/2022 2240   PROT 6.0 (L) 07/05/2022 2240   PROT 6.7 10/27/2021 1115   ALBUMIN 2.9 (L) 07/05/2022 2240   ALBUMIN 4.1 10/27/2021 1115   AST 18 07/05/2022 2240   ALT 16 07/05/2022 2240   ALKPHOS 67 07/05/2022 2240   BILITOT 0.4 07/05/2022 2240   BILITOT 0.3 10/27/2021 1115   GFRNONAA 24 (L) 07/05/2022 2240   GFRNONAA 51 (L) 08/10/2016 1647   GFRAA 37 (L) 03/31/2020 1417   GFRAA 58 (L) 08/10/2016 1647     Assessment and Plan: * Aspiration pneumonia ([name]) PNA likely due to ongoing aspiration in setting of advanced ALS. PNA pathway Empiric rocephin + azithro O2 if needed Pt is DNI/DNR though Check urine for s.pneumo Had COVID in Aug.  Catheter-associated urinary tract infection ([place]) Culture pending Rocephin Change out foley.  Hyperkalemia Lokelma in ED K 5.8 on follow up Next BMP at 0500 this AM Tele monitor Normal saline IVF  Urinary retention Replace foley given CAUTI. Retention due to: 1) BPH 2) ? Neurogenic bladder from ALS (not formally diagnosed to have neurogenic bladder but has advanced ALS so wouldn't be surprising).  Continue Flomax and Proscar. Follow up with urology, though again, I question if neurogenic bladder playing a role here.  Acute metabolic encephalopathy Due to PNA + UTI.  ALS (amyotrophic lateral sclerosis) ([name]) Pt with advanced ALS.  At baseline pt is bed bound, doing rather poorly with ongoing aspiration it sounds like from pal care notes last month. Hospice offered but pt wasn't ready for it. Pt is DNR/DNI, no feeding tube.  Dysphagia Sounds like patient aspirating everything he drinks at baseline.  Purred diet, supposed to be on thickened liquids but doesn't use thickener. Given goals of care are more  toward patient comfort than curative at this point, will allow patient to continue pureed diet with thin liquids Also I suspect that if we tried to enforce thickened liquids, hed just become dehydrated.  Sounds like PO intake is already marginal at best from pal care note last month. Had this discussion with family member at bedside.  DVT (deep venous thrombosis) ([place]) History of DVT Continue eliquis  DNR (do not resuscitate) Confirmed with family.  CKD (chronic kidney disease), stage IV ([name]) Creat today stable from baseline. Does have hyperK though, see above.  Seizure disorder ([place]) Continue keppra. Will order the keppra IV given AMS at the moment, just to make sure he gets it.      Advance Care Planning:   Code Status: DNR DNI, do not feeding tube  Consults: None  Family Communication: Wife at bedside  Severity of Illness: The appropriate patient status for this patient is INPATIENT. Inpatient status is judged to be reasonable and necessary in order to provide the required intensity of service to ensure the patient's safety. The patient's presenting symptoms, physical exam findings, and initial radiographic and laboratory data in the context of their chronic comorbidities is felt to place them at high risk for further clinical deterioration. Furthermore, it is not anticipated that the patient will be medically stable for discharge from the hospital within 2 midnights of admission.   * I certify that at the point of admission it is my  clinical judgment that the patient will require inpatient hospital care spanning beyond 2 midnights from the point of admission due to high intensity of service, high risk for further deterioration and high frequency of surveillance required.*  Author: Etta Quill., DO 07/06/2022 3:06 AM  For on call review [email or web address].

## 2022-07-06 NOTE — Assessment & Plan Note (Signed)
--   Confirmed with family 

## 2022-07-06 NOTE — Assessment & Plan Note (Addendum)
Sounds like patient aspirating everything he drinks at baseline.  Purred diet, supposed to be on thickened liquids but doesn't use thickener. 1. Given goals of care are more toward patient comfort than curative at this point, will allow patient to continue pureed diet with thin liquids 2. Also I suspect that if we tried to enforce thickened liquids, hed just become dehydrated.  Sounds like PO intake is already marginal at best from pal care note last month. 3. Had this discussion with family member at bedside.

## 2022-07-06 NOTE — Assessment & Plan Note (Signed)
Culture pending Rocephin Change out foley.

## 2022-07-06 NOTE — Progress Notes (Signed)
No charge note  Patient seen and examined this morning, admitted overnight, H&P reviewed and agree with the assessment and plan.  71 year old male with prior CVA, ALS, anemia, HTN, CKD 4, seizure disorder comes to the hospital for generalized weakness.  He was also having a cough, and chest x-ray done as an outpatient was concern for pneumonia.  He continued to get worse despite being placed on antibiotics, became weaker and sleeping most of the time, and eventually was brought to the hospital.  He was also found to be hyperkalemic, repeat Lokelma this morning.  He has advanced ALS at baseline, and per prior notes he is aspirating most times.  Palliative care was consulted, will follow, may need hospice  Scheduled Meds:  amLODipine  10 mg Oral Daily   apixaban  5 mg Oral BID   atorvastatin  40 mg Oral Daily   brimonidine  1 drop Both Eyes Daily   dorzolamide-timolol  1 drop Both Eyes Daily   ferrous sulfate  325 mg Oral BID WC   finasteride  5 mg Oral Daily   fluticasone furoate-vilanterol  2 puff Inhalation Daily   glycopyrrolate  1 mg Oral BID   guaiFENesin  600 mg Oral Daily   hydrALAZINE  25 mg Oral TID   latanoprost  1 drop Both Eyes QHS   metoprolol succinate  50 mg Oral QHS   montelukast  10 mg Oral QHS   multivitamin with minerals  1 tablet Oral Daily   tamsulosin  0.4 mg Oral Daily   Zinc Oxide   Topical Q0200   Continuous Infusions:  sodium chloride 100 mL/hr at 07/06/22 0409   azithromycin Stopped (07/06/22 8938)   cefTRIAXone (ROCEPHIN)  IV Stopped (07/06/22 1035)   levETIRAcetam Stopped (07/06/22 0414)   PRN Meds:.albuterol, dicyclomine, fluticasone, polyethylene glycol  Jeury Mcnab M. Cruzita Lederer, MD, PhD [facility]  Between 7 am - 7 pm you can contact me via Amion (for emergencies) or [place] (non urgent matters).  I am not available 7 pm - 7 am, please contact night coverage MD/APP via Amion

## 2022-07-06 NOTE — Consult Note (Signed)
WOC Nurse Consult Note: Reason for Consult: "decubitus ulcers" Wound type: Unstageable Pressure Injury: right buttock Pressure Injury POA: Yes Measurement:2cm x 2cm x 0cm Wound bed:  90% eschar vs more scab like Drainage (amount, consistency, odor) none Periwound: intact  Dressing procedure/placement/frequency: Zinc barrier ointment daily and PRN incontinence, limit use of incontinence briefs while in bed.   Discussed POC with bedside nurse.  Re consult if needed, will not follow at this time. Thanks  Kojo Liby [employer], RN,CWOCN, CNS, [place] [phone])

## 2022-07-06 NOTE — Assessment & Plan Note (Signed)
Pt with advanced ALS.  At baseline pt is bed bound, doing rather poorly with ongoing aspiration it sounds like from pal care notes last month. Hospice offered but pt wasn't ready for it. Pt is DNR/DNI, no feeding tube.

## 2022-07-06 NOTE — Assessment & Plan Note (Signed)
PNA likely due to ongoing aspiration in setting of advanced ALS. 1. PNA pathway 2. Empiric rocephin + azithro 3. O2 if needed 4. Pt is DNI/DNR though 5. Check urine for s.pneumo 6. Had [place] in Aug.

## 2022-07-07 DIAGNOSIS — Z7189 Other specified counseling: Secondary | ICD-10-CM | POA: Diagnosis not present

## 2022-07-07 DIAGNOSIS — J690 Pneumonitis due to inhalation of food and vomit: Secondary | ICD-10-CM | POA: Diagnosis not present

## 2022-07-07 DIAGNOSIS — Z515 Encounter for palliative care: Secondary | ICD-10-CM | POA: Diagnosis not present

## 2022-07-07 LAB — COMPREHENSIVE METABOLIC PANEL
ALT: 14 U/L (ref 0–44)
AST: 11 U/L — ABNORMAL LOW (ref 15–41)
Albumin: 2.5 g/dL — ABNORMAL LOW (ref 3.5–5.0)
Alkaline Phosphatase: 60 U/L (ref 38–126)
Anion gap: 3 — ABNORMAL LOW (ref 5–15)
BUN: 46 mg/dL — ABNORMAL HIGH (ref 8–23)
CO2: 18 mmol/L — ABNORMAL LOW (ref 22–32)
Calcium: 8.9 mg/dL (ref 8.9–10.3)
Chloride: 118 mmol/L — ABNORMAL HIGH (ref 98–111)
Creatinine, Ser: 2.64 mg/dL — ABNORMAL HIGH (ref 0.61–1.24)
GFR, Estimated: 25 mL/min — ABNORMAL LOW (ref >60)
Glucose, Bld: 99 mg/dL (ref 70–99)
Potassium: 5.5 mmol/L — ABNORMAL HIGH (ref 3.5–5.1)
Sodium: 139 mmol/L (ref 135–145)
Total Bilirubin: 0.3 mg/dL (ref 0.3–1.2)
Total Protein: 5.4 g/dL — ABNORMAL LOW (ref 6.5–8.1)

## 2022-07-07 LAB — CBC
HCT: 27.3 % — ABNORMAL LOW (ref 39.0–52.0)
Hemoglobin: 8.5 g/dL — ABNORMAL LOW (ref 13.0–17.0)
MCH: 28.4 pg (ref 26.0–34.0)
MCHC: 31.1 g/dL (ref 30.0–36.0)
MCV: 91.3 fL (ref 80.0–100.0)
Platelets: 244 10*3/uL (ref 150–400)
RBC: 2.99 MIL/uL — ABNORMAL LOW (ref 4.22–5.81)
RDW: 14.2 % (ref 11.5–15.5)
WBC: 7.2 10*3/uL (ref 4.0–10.5)
nRBC: 0 % (ref 0.0–0.2)

## 2022-07-07 LAB — MAGNESIUM: Magnesium: 1.9 mg/dL (ref 1.7–2.4)

## 2022-07-07 LAB — GLUCOSE, CAPILLARY: Glucose-Capillary: 110 mg/dL — ABNORMAL HIGH (ref 70–99)

## 2022-07-07 LAB — POTASSIUM: Potassium: 5.3 mmol/L — ABNORMAL HIGH (ref 3.5–5.1)

## 2022-07-07 MED ORDER — SODIUM ZIRCONIUM CYCLOSILICATE 10 G PO PACK
10.0000 g | PACK | Freq: Once | ORAL | Status: AC
  Administered 2022-07-07: 10 g via ORAL
  Filled 2022-07-07: qty 1

## 2022-07-07 MED ORDER — SODIUM CHLORIDE 0.9 % IV SOLN
2.0000 g | INTRAVENOUS | Status: DC
  Administered 2022-07-07: 2 g via INTRAVENOUS
  Filled 2022-07-07: qty 12.5

## 2022-07-07 NOTE — Progress Notes (Signed)
[facility]) Hospital Liaison note:  This patient is currently enrolled in [facility] outpatient-based Palliative Care. Will continue to follow for disposition.  Please call with any outpatient palliative questions or concerns.  Thank you, Lorelee Market, LPN [facility] Liaison [phone]

## 2022-07-07 NOTE — Consult Note (Signed)
Palliative Medicine Inpatient Consult Note  Consulting Provider: Caren Griffins, MD  Reason for consult:   [place] [facility] Consult  Reason for Consult? Darrell [name]   07/07/2022  HPI:  Per intake H&P --> Darrell [name] is a 71 y.o. male with medical history significant of prior stroke, ALS, anemia, hypertension chronic kidney disease stage IV, seizure disorder. Admitted in the setting of generalized weakness.  OP Palliative support has been ongoing with [facility].   Palliative care has been consulted inpatient for conversations related to goals of care and possible introduction of hospice benefit given patients advanced ALS.  Clinical Assessment/Goals of Care:  *Please note that this is a verbal dictation therefore any spelling or grammatical errors are due to the "Darrell [name]" system interpretation.  I have reviewed medical records including EPIC notes, labs and imaging, received report from bedside RN, assessed the patient who is lying in bed and can speak though has little vocal phonation.    I met with Darrell [name] and his wife, Darrell [name] to further discuss diagnosis prognosis, Darrell [name], EOL wishes, disposition and options.   I introduced Palliative Medicine as specialized medical care for people living with serious illness. It focuses on providing relief from the symptoms and stress of a serious illness. The goal is to improve quality of life for both the patient and the family.  Medical History Review and Understanding:  A brief discussion of Darrell [name]'s past medical history was had. Discussed that he has had ALS for the past five years or so. He has Stg IV kidney disease and has prior had seizures.   Social History:  Patient was born and raised in [place], [name]. He is a retired Administrator. He has been married to his wife for 29 years and they have [name] daughter. 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  Patient have a good family and faith network, who provide support and assist with patient's care. He listens to sports on TV which he enjoys.  Functional and Nutritional State:  Patient is bedbound at [facility] and requires assistance for all bADLs.   Advance Directives:  A detailed discussion was had today regarding advanced directives. Patient spouse is his surrogate Darrell [name].   Code Status:  Concepts specific to code status, artifical feeding and hydration, continued IV antibiotics and rehospitalization was had.  The difference between a aggressive medical intervention path  and a palliative comfort care path for this patient at this time was had. A review of patients MOST for was held as below.   Cardiopulmonary Resuscitation: Do Not Attempt Resuscitation (DNR/No CPR)  Medical Interventions: Limited Additional Interventions: Use medical treatment, IV fluids and cardiac monitoring as indicated, DO NOT USE intubation or mechanical ventilation. May consider use of less invasive airway support such as BiPAP or CPAP. Also provide comfort measures. Transfer to the hospital if indicated. Avoid intensive care.   Antibiotics: Antibiotics if indicated  IV Fluids: IV fluids for a defined trial period  Feeding Tube: No feeding tube   No changes have been made at this time.   Discussion:  We reviewed that Darrell [name] has endured ALS for the past five years. Discussed the trajectory of this diease process. Reviewed that Darrell [name] has not been able to walk and has been completely bed-bound for six months. He has also had multiple aspiration pneumonias. I introduced the idea of hospice as often these are the symptoms seen later in the disease process.  Darrell [name] see's Darrell [name] at the [facility]. Per patient and his wife though there has been no education on prognosis . Disease trajectory/ end of life planning.   We reviewed that OP [facility] has been helpful and both Darrell [name] and his wife are of the  mindset to be open and honest about where things are going. I shared that in all honesty hospice if a reasonable consideration at this time. Darrell [name] shares that he is not yet ready for this. We reviewed that if additional re-hospitalizations occur or further decline from this point on then perhaps keeping it in the back of his mind would be within reason. They agree  Discussed the importance of continued conversation with family and their  medical providers regarding overall plan of care and treatment options, ensuring decisions are within the context of the patients values and GOCs.  Decision Maker: Darrell [name] (Spouse): [phone] (Mobile)  SUMMARY OF RECOMMENDATIONS   DNAR/DNI  Discussed OP Palliative versus hospice care --> Patient wants to continue along with present course of treatment, he is not yet ready for hospice  Patient and his wife would like a direct approach to communication regarding prognosis  TOC - OP Palliative support to continue on discharge  Palliative care will incrementally follow along though goals at this time are clear  Code Status/Advance Care Planning: DNAR/DNI  Palliative Prophylaxis:  Aspiration, Bowel Regimen, Delirium Protocol, Frequent Pain Assessment, Oral Care, Palliative Wound Care, and Turn Reposition  Additional Recommendations (Limitations, Scope, Preferences): Continue current care  Psycho-social/Spiritual:  Desire for further Chaplaincy support: Not presently Additional Recommendations: Ongoing conversations regarding ALS    Prognosis: Patient would at this time meet criteria for hospice in the setting of recurrent re-hospitalization, functional decline, aspiration pneumonia's, and urinary infection.   Discharge Planning: Discharge to home once medically optimized.  Vitals:   07/07/22 0059 07/07/22 0519  BP: 138/79 (!) 151/90  Pulse: 62 66  Resp: 16 16  Temp: 97.6 F (36.4 C) 98.2 F (36.8 C)  SpO2: 98% 98%    Intake/Output  Summary (Last 24 hours) at 07/07/2022 9518 Last data filed at 07/07/2022 0406 Gross per 24 hour  Intake 1424.73 ml  Output 950 ml  Net 474.73 ml   Last Weight  Most recent update: 07/05/2022 10:26 PM    Weight  81.6 kg (180 lb)            Gen:  Eldery AA M in NAD HEENT: Dry mucous membranes CV: Regular rate and rhythm  PULM:  On RA, breathing even and nonlabored ABD: soft/nontender  EXT: No edema  Neuro: Alert and oriented x3   PPS: 20%   This conversation/these recommendations were discussed with patient primary care team, Dr. Cruzita Lederer  Billing based on MDM: High  Problems Addressed: [name] acute or chronic illness or injury that poses a threat to life or bodily function  Amount and/or Complexity of Data: Category 3:Discussion of management or test interpretation with external physician/other qualified health care professional/appropriate source (not separately reported)  Risks: Decision regarding hospitalization or escalation of hospital care and Decision not to resuscitate or to de-escalate care because of poor prognosis ______________________________________________________ [place] Team Team Cell Phone: [phone] Please utilize secure chat with additional questions, if there is no response within 30 minutes please call the above phone number  Palliative Medicine Team providers are available by phone from 7am to 7pm daily and can be reached through the team cell phone.  Should this  patient require assistance outside of these hours, please call the patient's attending physician.

## 2022-07-07 NOTE — Progress Notes (Signed)
PROGRESS NOTE  Darrell [name] SLH:[id number] DOB: 07/11/1951 DOA: 07/05/2022 PCP: [name], MD   LOS: 1 day   Brief Narrative / Interim history: 71 year old male with prior CVA, ALS, anemia, HTN, CKD 4, seizure disorder comes to the hospital for generalized weakness.  He was also having a cough, and chest x-ray done as an outpatient was concern for pneumonia.  He continued to get worse despite being placed on antibiotics, became weaker and sleeping most of the time, and eventually was brought to the hospital.  He was also found to be hyperkalemic.  He has advanced ALS at baseline, and per prior notes he is aspirating most times.   Subjective / 24h Interval events: Very soft voice, but alert, he is feeling well and denies any shortness of breath or chest pain  Assesement and Plan: Principal Problem:   Aspiration pneumonia ([place]) Active Problems:   Hyperkalemia   Catheter-associated urinary tract infection ([name])   Dysphagia   ALS (amyotrophic lateral sclerosis) ([name])   Acute metabolic encephalopathy   Urinary retention   Seizure disorder ([name])   CKD (chronic kidney disease), stage IV ([name])   Anemia, chronic disease   DNR (do not resuscitate)   DVT (deep venous thrombosis) ([place])   Principal problem Aspiration pneumonia-continue antibiotics.  Respiratory status stable, he is on room air.  Likely has chronic aspiration of saliva in addition to food.  Active problems Catheter associated urinary tract infection-preliminary showing Pseudomonas.  Change to cefepime  Urinary retention-chronic, Foley replaced per admitted  Acute metabolic encephalopathy-due to infectious process, now improving.  Hyperkalemia-in the setting of chronic kidney disease.  Repeat Lokelma  CKD 4-baseline creatinine 2.7-3.3, currently at baseline  Seizure disorder-continue Keppra  Goals of care-palliative care consulted, I have also discussed with the wife at bedside.  He has recurrent aspiration  pneumonia and there is no good way to prevent this in the future.  He is likely aspirating his own saliva when he is sleeping.  It does not sound like she is ready for hospice based on my discussion with her as well as the palliative care team.  Outpatient follow-up with palliative care  Scheduled Meds:  amLODipine  10 mg Oral Daily   apixaban  5 mg Oral BID   atorvastatin  40 mg Oral Daily   brimonidine  1 drop Both Eyes Daily   dorzolamide-timolol  1 drop Both Eyes Daily   ferrous sulfate  325 mg Oral BID WC   finasteride  5 mg Oral Daily   fluticasone furoate-vilanterol  2 puff Inhalation Daily   glycopyrrolate  1 mg Oral BID   guaiFENesin  600 mg Oral Daily   hydrALAZINE  25 mg Oral TID   latanoprost  1 drop Both Eyes QHS   metoprolol succinate  50 mg Oral QHS   montelukast  10 mg Oral QHS   multivitamin with minerals  1 tablet Oral Daily   tamsulosin  0.4 mg Oral Daily   Zinc Oxide   Topical Q0200   Continuous Infusions:  sodium chloride 100 mL/hr at 07/07/22 1239   azithromycin Stopped (07/07/22 0243)   cefTRIAXone (ROCEPHIN)  IV 2 g (07/07/22 1042)   levETIRAcetam Stopped (07/07/22 0340)   PRN Meds:.albuterol, dicyclomine, fluticasone, polyethylene glycol  Current Outpatient Medications  Medication Instructions   acetaminophen (TYLENOL) 325 mg, Oral, Daily PRN   albuterol (VENTOLIN HFA) 108 (90 Base) MCG/ACT inhaler Inhale 2 puffs into the lungs every 6 (six) hours as needed for up to 30  days for Wheezing.   amLODipine (NORVASC) 10 mg, Oral, Daily   apixaban (ELIQUIS) 5 mg, Oral, 2 times daily, Upon completion of starter pack   atorvastatin (LIPITOR) 40 mg, Oral, Daily   azithromycin (ZITHROMAX) 250 mg, Oral, Daily, Take first 2 tablets together, then 1 every day until finished.   brimonidine (ALPHAGAN) 0.2 % ophthalmic solution 1 drop, Both Eyes, Daily   cetirizine (ZYRTEC) 10 mg, Oral, Daily PRN   dicyclomine (BENTYL) 10 mg, Oral, Daily PRN   dorzolamide-timolol  (COSOPT) 22.3-6.8 MG/ML ophthalmic solution 1 drop, Both Eyes, Daily   ferrous sulfate (FEROSUL) 325 mg, Oral, 2 times daily with meals   finasteride (PROSCAR) 5 mg, Oral, Daily   fluticasone (FLONASE) 50 MCG/ACT nasal spray 1 spray, Each Nare, Daily PRN   fluticasone furoate-vilanterol (BREO ELLIPTA) 200-25 MCG/ACT AEPB 2 puffs, Inhalation, Daily PRN   glycopyrrolate (ROBINUL) 1 MG tablet Take 1 tablet (1 mg total) by mouth 2 times daily.   guaiFENesin (MUCINEX) 600 mg, Oral, Daily   hydrALAZINE (APRESOLINE) 25 mg, Oral, 3 times daily   hydrocortisone (ANUSOL-HC) 2.5 % rectal cream 1 Application, Rectal, 2 times daily   latanoprost (XALATAN) 0.005 % ophthalmic solution 1 drop, Both Eyes, Daily at bedtime   levETIRAcetam (KEPPRA) 500 mg, Oral, 2 times daily   metoprolol succinate (TOPROL-XL) 50 mg, Oral, Daily at bedtime, Take with or immediately following a meal.   montelukast (SINGULAIR) 10 MG tablet 1 tablet, Oral, Daily at bedtime   Multiple Vitamin (MULTI-VITAMIN) tablet 1 tablet, Oral, Daily   ondansetron (ZOFRAN) 4 mg, Oral, Every 8 hours PRN   polyethylene glycol powder (GLYCOLAX/MIRALAX) 17 g, Oral, Daily, Follow instructions in the bottle. Use no more than 7 days. PRN for occasional constipation.   tamsulosin (FLOMAX) 0.4 mg, Oral, Daily   torsemide (DEMADEX) 20 mg, Oral, Daily PRN    Diet Orders (From admission, onward)     Start     Ordered   07/06/22 0241  DIET - DYS 1 Room service appropriate? Yes; Fluid consistency: Thin  Diet effective now       Question Answer Comment  Room service appropriate? Yes   Fluid consistency: Thin      07/06/22 0241            DVT prophylaxis:  apixaban (ELIQUIS) tablet 5 mg   Lab Results  Component Value Date   PLT 244 07/07/2022      Code Status: DNR  Family Communication: wife at bedside  Status is: Inpatient  Remains inpatient appropriate because: hyperkalemia  Level of care: Telemetry Medical  Consultants:   Palliative care  Objective: Vitals:   07/07/22 0519 07/07/22 0801 07/07/22 0835 07/07/22 1206  BP: (!) 151/90 (!) 148/77  (!) 151/85  Pulse: 66 (!) 59  60  Resp: '16 16 16 17  '$ Temp: 98.2 F (36.8 C) 98.2 F (36.8 C)  98.6 F (37 C)  TempSrc: Oral Oral  Oral  SpO2: 98% 100% 99% 100%  Weight:      Height:        Intake/Output Summary (Last 24 hours) at 07/07/2022 1408 Last data filed at 07/07/2022 1239 Gross per 24 hour  Intake 2345.62 ml  Output 1500 ml  Net 845.62 ml   Wt Readings from Last 3 Encounters:  07/05/22 81.6 kg  05/17/22 77.4 kg  04/25/22 81.2 kg    Examination:  Constitutional: NAD Eyes: no scleral icterus ENMT: Mucous membranes are moist.  Neck: normal, supple Respiratory: clear to auscultation bilaterally,  no wheezing, no crackles. Normal respiratory effort.  Cardiovascular: Regular rate and rhythm, no murmurs / rubs / gallops. No LE edema.  Abdomen: non distended, no tenderness. Bowel sounds positive.  Musculoskeletal: no clubbing / cyanosis.   Data Reviewed: I have independently reviewed following labs and imaging studies   CBC Recent Labs  Lab 07/05/22 2240 07/06/22 0408 07/07/22 0417  WBC 8.8 9.6 7.2  HGB 8.9* 9.1* 8.5*  HCT 28.0* 28.7* 27.3*  PLT 287 266 244  MCV 90.0 90.3 91.3  MCH 28.6 28.6 28.4  MCHC 31.8 31.7 31.1  RDW 14.3 14.2 14.2  LYMPHSABS 2.0  --   --   MONOABS 0.8  --   --   EOSABS 0.4  --   --   BASOSABS 0.1  --   --     Recent Labs  Lab 07/05/22 2240 07/05/22 2249 07/05/22 2355 07/06/22 0408 07/07/22 0417  NA 138  --   --  140 139  K 6.2*  --  5.8* 6.1* 5.5*  CL 111  --   --  112* 118*  CO2 21*  --   --  18* 18*  GLUCOSE 98  --   --  96 99  BUN 51*  --   --  48* 46*  CREATININE 2.80*  --   --  2.65* 2.64*  CALCIUM 9.4  --   --  9.9 8.9  AST 18  --   --   --  11*  ALT 16  --   --   --  14  ALKPHOS 67  --   --   --  60  BILITOT 0.4  --   --   --  0.3  ALBUMIN 2.9*  --   --   --  2.5*  MG  --   --   --    --  1.9  LATICACIDVEN  --  0.5  --   --   --     ------------------------------------------------------------------------------------------------------------------ No results for input(s): "CHOL", "HDL", "LDLCALC", "TRIG", "CHOLHDL", "LDLDIRECT" in the last 72 hours.  Lab Results  Component Value Date   HGBA1C 5.1 08/20/2019   ------------------------------------------------------------------------------------------------------------------ No results for input(s): "TSH", "T4TOTAL", "T3FREE", "THYROIDAB" in the last 72 hours.  Invalid input(s): "FREET3"  Cardiac Enzymes No results for input(s): "CKMB", "TROPONINI", "MYOGLOBIN" in the last 168 hours.  Invalid input(s): "CK" ------------------------------------------------------------------------------------------------------------------    Component Value Date/Time   BNP 101.7 (H) 04/13/2021 2325    CBG: Recent Labs  Lab 07/07/22 1201  GLUCAP 110*    Recent Results (from the past 240 hour(s))  Blood culture (routine x 2)     Status: None (Preliminary result)   Collection Time: 07/05/22 10:40 PM   Specimen: BLOOD  Result Value Ref Range Status   Specimen Description BLOOD RIGHT ANTECUBITAL  Final   Special Requests   Final    BOTTLES DRAWN AEROBIC AND ANAEROBIC Blood Culture adequate volume   Culture   Final    NO GROWTH < 12 HOURS Performed at [facility], [place] [address], [place], [place] [address]    Report Status PENDING  Incomplete  Urine Culture     Status: Abnormal (Preliminary result)   Collection Time: 07/05/22 10:49 PM   Specimen: Urine, Clean Catch  Result Value Ref Range Status   Specimen Description URINE, CLEAN CATCH  Final   Special Requests NONE  Final   Culture (A)  Final    >=100,000 COLONIES/mL PSEUDOMONAS AERUGINOSA SUSCEPTIBILITIES TO  FOLLOW Performed at [facility], [place] [address], [place], [place] [address]    Report Status PENDING  Incomplete  Resp Panel by RT-PCR (Flu A&B,  Covid) Anterior Nasal Swab     Status: None   Collection Time: 07/05/22 10:50 PM   Specimen: Anterior Nasal Swab  Result Value Ref Range Status   SARS Coronavirus 2 by RT PCR NEGATIVE NEGATIVE Final    Comment: (NOTE) SARS-CoV-2 target nucleic acids are NOT DETECTED.  The SARS-CoV-2 RNA is generally detectable in upper respiratory specimens during the acute phase of infection. The lowest concentration of SARS-CoV-2 viral copies this assay can detect is 138 copies/mL. A negative result does not preclude SARS-Cov-2 infection and should not be used as the sole basis for treatment or other patient management decisions. A negative result may occur with  improper specimen collection/handling, submission of specimen other than nasopharyngeal swab, presence of viral mutation(s) within the areas targeted by this assay, and inadequate number of viral copies(<138 copies/mL). A negative result must be combined with clinical observations, patient history, and epidemiological information. The expected result is Negative.  Fact Sheet for Patients:  EntrepreneurPulse.com.au  Fact Sheet for Healthcare Providers:  IncredibleEmployment.be  This test is no t yet approved or cleared by the [place] FDA and  has been authorized for detection and/or diagnosis of SARS-CoV-2 by FDA under an Emergency Use Authorization (EUA). This EUA will remain  in effect (meaning this test can be used) for the duration of the COVID-19 declaration under Section 564(b)(1) of the Act, 21 U.S.C.section 360bbb-3(b)(1), unless the authorization is terminated  or revoked sooner.       Influenza A by PCR NEGATIVE NEGATIVE Final   Influenza B by PCR NEGATIVE NEGATIVE Final    Comment: (NOTE) The Xpert Xpress SARS-CoV-2/FLU/RSV plus assay is intended as an aid in the diagnosis of influenza from Nasopharyngeal swab specimens and should not be used as a sole basis for treatment. Nasal  washings and aspirates are unacceptable for Xpert Xpress SARS-CoV-2/FLU/RSV testing.  Fact Sheet for Patients: EntrepreneurPulse.com.au  Fact Sheet for Healthcare Providers: IncredibleEmployment.be  This test is not yet approved or cleared by the [place] FDA and has been authorized for detection and/or diagnosis of SARS-CoV-2 by FDA under an Emergency Use Authorization (EUA). This EUA will remain in effect (meaning this test can be used) for the duration of the COVID-19 declaration under Section 564(b)(1) of the Act, 21 U.S.C. section 360bbb-3(b)(1), unless the authorization is terminated or revoked.  Performed at [facility], [place] [address] [place], [place] [address]   Blood culture (routine x 2)     Status: None (Preliminary result)   Collection Time: 07/05/22 10:58 PM   Specimen: BLOOD  Result Value Ref Range Status   Specimen Description BLOOD SITE NOT SPECIFIED  Final   Special Requests   Final    BOTTLES DRAWN AEROBIC AND ANAEROBIC Blood Culture adequate volume   Culture   Final    NO GROWTH < 12 HOURS Performed at [facility], [place] [address] [place], [place] [address]    Report Status PENDING  Incomplete  MRSA Next Gen by PCR, Nasal     Status: None   Collection Time: 07/06/22  4:10 AM   Specimen: Nasal Mucosa; Nasal Swab  Result Value Ref Range Status   MRSA by PCR Next Gen NOT DETECTED NOT DETECTED Final    Comment: (NOTE) The GeneXpert MRSA Assay (FDA approved for NASAL specimens only),  is one component of a comprehensive MRSA colonization surveillance program. It is not intended to diagnose MRSA infection nor to guide or monitor treatment for MRSA infections. Test performance is not FDA approved in patients less than 74 years old. Performed at [facility], [place] [address] [place], [place] [address]    Radiology Studies: No results found.  Marzetta Board, MD, PhD [facility]  Between  7 am - 7 pm I am available, please contact me via Amion (for emergencies) or Securechat (non urgent messages)  Between 7 pm - 7 am I am not available, please contact night coverage MD/APP via Amion

## 2022-07-08 DIAGNOSIS — J690 Pneumonitis due to inhalation of food and vomit: Secondary | ICD-10-CM | POA: Diagnosis not present

## 2022-07-08 LAB — URINE CULTURE: Culture: >=100000 — AB

## 2022-07-08 LAB — BASIC METABOLIC PANEL
Anion gap: 4 — ABNORMAL LOW (ref 5–15)
BUN: 44 mg/dL — ABNORMAL HIGH (ref 8–23)
CO2: 16 mmol/L — ABNORMAL LOW (ref 22–32)
Calcium: 8.8 mg/dL — ABNORMAL LOW (ref 8.9–10.3)
Chloride: 117 mmol/L — ABNORMAL HIGH (ref 98–111)
Creatinine, Ser: 2.48 mg/dL — ABNORMAL HIGH (ref 0.61–1.24)
GFR, Estimated: 27 mL/min — ABNORMAL LOW (ref >60)
Glucose, Bld: 93 mg/dL (ref 70–99)
Potassium: 4.9 mmol/L (ref 3.5–5.1)
Sodium: 137 mmol/L (ref 135–145)

## 2022-07-08 LAB — MAGNESIUM: Magnesium: 1.8 mg/dL (ref 1.7–2.4)

## 2022-07-08 MED ORDER — SODIUM CHLORIDE 0.9 % IV SOLN
2.0000 g | Freq: Two times a day (BID) | INTRAVENOUS | Status: DC
  Administered 2022-07-08 – 2022-07-09 (×2): 2 g via INTRAVENOUS
  Filled 2022-07-08 (×2): qty 12.5

## 2022-07-08 MED ORDER — ACETAMINOPHEN 325 MG PO TABS
650.0000 mg | ORAL_TABLET | Freq: Four times a day (QID) | ORAL | Status: DC | PRN
  Administered 2022-07-08: 650 mg via ORAL
  Filled 2022-07-08: qty 2

## 2022-07-08 MED ORDER — SODIUM ZIRCONIUM CYCLOSILICATE 10 G PO PACK
10.0000 g | PACK | Freq: Once | ORAL | Status: AC
  Administered 2022-07-08: 10 g via ORAL
  Filled 2022-07-08: qty 1

## 2022-07-08 NOTE — Progress Notes (Signed)
PROGRESS NOTE  Darrell [name] XMI:[id number] DOB: [birth year] DOA: 07/05/2022 PCP: [name], MD   LOS: 2 days   Brief Narrative / Interim history: 71 year old male with prior CVA, ALS, anemia, HTN, CKD 4, seizure disorder comes to the hospital for generalized weakness.  He was also having a cough, and chest x-ray done as an outpatient was concern for pneumonia.  He continued to get worse despite being placed on antibiotics, became weaker and sleeping most of the time, and eventually was brought to the hospital.  He was also found to be hyperkalemic.  He has advanced ALS at baseline, and per prior notes he is aspirating most times.   Subjective / 24h Interval events: Weak, but alert, can wake up, no complaints  Assesement and Plan: Principal Problem:   Aspiration pneumonia ([place]) Active Problems:   Hyperkalemia   Catheter-associated urinary tract infection ([name])   Dysphagia   ALS (amyotrophic lateral sclerosis) ([name])   Acute metabolic encephalopathy   Urinary retention   Seizure disorder ([name])   CKD (chronic kidney disease), stage IV ([name])   Anemia, chronic disease   DNR (do not resuscitate)   DVT (deep venous thrombosis) ([place])   Principal problem Aspiration pneumonia-continue antibiotics.  Respiratory status stable, he is on room air.  Likely has chronic aspiration of saliva in addition to food.  Active problems Catheter associated urinary tract infection-preliminary showing Pseudomonas.  He was changed to cefepime, Pseudomonas is pansensitive.  We will switch to Cipro on discharge  Urinary retention-chronic, Foley replaced per admitted  Acute metabolic encephalopathy-due to infectious process, now improving, almost back to baseline  Hyperkalemia-in the setting of chronic kidney disease.  Potassium normal but still on the high side.  Repeat Lokelma today  CKD 4-baseline creatinine 2.7-3.3, at baseline  Seizure disorder-continue Keppra  Goals of care-palliative care  consulted, I have also discussed with the wife at bedside.  He has recurrent aspiration pneumonia and there is no good way to prevent this in the future.  He is likely aspirating his own saliva when he is sleeping.  It does not sound like she is ready for hospice based on my discussion with her as well as the palliative care team.  Encouraged the wife to talk to the patient as he is alert and oriented,, have him express his wishes.  Scheduled Meds:  amLODipine  10 mg Oral Daily   apixaban  5 mg Oral BID   atorvastatin  40 mg Oral Daily   brimonidine  1 drop Both Eyes Daily   dorzolamide-timolol  1 drop Both Eyes Daily   ferrous sulfate  325 mg Oral BID WC   finasteride  5 mg Oral Daily   fluticasone furoate-vilanterol  2 puff Inhalation Daily   glycopyrrolate  1 mg Oral BID   guaiFENesin  600 mg Oral Daily   hydrALAZINE  25 mg Oral TID   latanoprost  1 drop Both Eyes QHS   metoprolol succinate  50 mg Oral QHS   montelukast  10 mg Oral QHS   multivitamin with minerals  1 tablet Oral Daily   tamsulosin  0.4 mg Oral Daily   Zinc Oxide   Topical Q0200   Continuous Infusions:  sodium chloride 100 mL/hr at 07/08/22 0558   azithromycin 500 mg (07/08/22 0218)   ceFEPime (MAXIPIME) IV 2 g (07/07/22 1621)   levETIRAcetam 500 mg (07/08/22 0358)   PRN Meds:.albuterol, dicyclomine, fluticasone, polyethylene glycol  Current Outpatient Medications  Medication Instructions   acetaminophen (TYLENOL)  325 mg, Oral, Daily PRN   albuterol (VENTOLIN HFA) 108 (90 Base) MCG/ACT inhaler Inhale 2 puffs into the lungs every 6 (six) hours as needed for up to 30 days for Wheezing.   amLODipine (NORVASC) 10 mg, Oral, Daily   apixaban (ELIQUIS) 5 mg, Oral, 2 times daily, Upon completion of starter pack   atorvastatin (LIPITOR) 40 mg, Oral, Daily   azithromycin (ZITHROMAX) 250 mg, Oral, Daily, Take first 2 tablets together, then 1 every day until finished.   brimonidine (ALPHAGAN) 0.2 % ophthalmic solution 1  drop, Both Eyes, Daily   cetirizine (ZYRTEC) 10 mg, Oral, Daily PRN   dicyclomine (BENTYL) 10 mg, Oral, Daily PRN   dorzolamide-timolol (COSOPT) 22.3-6.8 MG/ML ophthalmic solution 1 drop, Both Eyes, Daily   ferrous sulfate (FEROSUL) 325 mg, Oral, 2 times daily with meals   finasteride (PROSCAR) 5 mg, Oral, Daily   fluticasone (FLONASE) 50 MCG/ACT nasal spray 1 spray, Each Nare, Daily PRN   fluticasone furoate-vilanterol (BREO ELLIPTA) 200-25 MCG/ACT AEPB 2 puffs, Inhalation, Daily PRN   glycopyrrolate (ROBINUL) 1 MG tablet Take 1 tablet (1 mg total) by mouth 2 times daily.   guaiFENesin (MUCINEX) 600 mg, Oral, Daily   hydrALAZINE (APRESOLINE) 25 mg, Oral, 3 times daily   hydrocortisone (ANUSOL-HC) 2.5 % rectal cream 1 Application, Rectal, 2 times daily   latanoprost (XALATAN) 0.005 % ophthalmic solution 1 drop, Both Eyes, Daily at bedtime   levETIRAcetam (KEPPRA) 500 mg, Oral, 2 times daily   metoprolol succinate (TOPROL-XL) 50 mg, Oral, Daily at bedtime, Take with or immediately following a meal.   montelukast (SINGULAIR) 10 MG tablet 1 tablet, Oral, Daily at bedtime   Multiple Vitamin (MULTI-VITAMIN) tablet 1 tablet, Oral, Daily   ondansetron (ZOFRAN) 4 mg, Oral, Every 8 hours PRN   polyethylene glycol powder (GLYCOLAX/MIRALAX) 17 g, Oral, Daily, Follow instructions in the bottle. Use no more than 7 days. PRN for occasional constipation.   tamsulosin (FLOMAX) 0.4 mg, Oral, Daily   torsemide (DEMADEX) 20 mg, Oral, Daily PRN    Diet Orders (From admission, onward)     Start     Ordered   07/06/22 0241  DIET - DYS 1 Room service appropriate? Yes; Fluid consistency: Thin  Diet effective now       Question Answer Comment  Room service appropriate? Yes   Fluid consistency: Thin      07/06/22 0241            DVT prophylaxis:  apixaban (ELIQUIS) tablet 5 mg   Lab Results  Component Value Date   PLT 244 07/07/2022      Code Status: DNR  Family Communication: wife at  bedside  Status is: Inpatient  Remains inpatient appropriate because: hyperkalemia  Level of care: Telemetry Medical  Consultants:  Palliative care  Objective: Vitals:   07/07/22 1726 07/07/22 2010 07/08/22 0550 07/08/22 0840  BP: (!) 145/73 136/72 130/62 138/64  Pulse: 64 67 64 67  Resp: '18 16 18 18  '$ Temp: 98.1 F (36.7 C) 98.8 F (37.1 C) 99 F (37.2 C) 98.1 F (36.7 C)  TempSrc: Oral Oral Oral Oral  SpO2: 98% 97%  100%  Weight:      Height:        Intake/Output Summary (Last 24 hours) at 07/08/2022 1105 Last data filed at 07/08/2022 0900 Gross per 24 hour  Intake 1907.32 ml  Output 1050 ml  Net 857.32 ml    Wt Readings from Last 3 Encounters:  07/05/22 81.6 kg  05/17/22 77.4 kg  04/25/22 81.2 kg    Examination:  Constitutional: NAD Eyes: lids and conjunctivae normal, no scleral icterus ENMT: mmm Neck: normal, supple Respiratory: clear to auscultation bilaterally, no wheezing, no crackles. Normal respiratory effort.  Cardiovascular: Regular rate and rhythm, no murmurs / rubs / gallops. No LE edema. Abdomen: soft, no distention, no tenderness. Bowel sounds positive.   Data Reviewed: I have independently reviewed following labs and imaging studies   CBC Recent Labs  Lab 07/05/22 2240 07/06/22 0408 07/07/22 0417  WBC 8.8 9.6 7.2  HGB 8.9* 9.1* 8.5*  HCT 28.0* 28.7* 27.3*  PLT 287 266 244  MCV 90.0 90.3 91.3  MCH 28.6 28.6 28.4  MCHC 31.8 31.7 31.1  RDW 14.3 14.2 14.2  LYMPHSABS 2.0  --   --   MONOABS 0.8  --   --   EOSABS 0.4  --   --   BASOSABS 0.1  --   --      Recent Labs  Lab 07/05/22 2240 07/05/22 2249 07/05/22 2355 07/06/22 0408 07/07/22 0417 07/07/22 1616 07/08/22 0237  NA 138  --   --  140 139  --  137  K 6.2*  --  5.8* 6.1* 5.5* 5.3* 4.9  CL 111  --   --  112* 118*  --  117*  CO2 21*  --   --  18* 18*  --  16*  GLUCOSE 98  --   --  96 99  --  93  BUN 51*  --   --  48* 46*  --  44*  CREATININE 2.80*  --   --  2.65* 2.64*   --  2.48*  CALCIUM 9.4  --   --  9.9 8.9  --  8.8*  AST 18  --   --   --  11*  --   --   ALT 16  --   --   --  14  --   --   ALKPHOS 67  --   --   --  60  --   --   BILITOT 0.4  --   --   --  0.3  --   --   ALBUMIN 2.9*  --   --   --  2.5*  --   --   MG  --   --   --   --  1.9  --  1.8  LATICACIDVEN  --  0.5  --   --   --   --   --      ------------------------------------------------------------------------------------------------------------------ No results for input(s): "CHOL", "HDL", "LDLCALC", "TRIG", "CHOLHDL", "LDLDIRECT" in the last 72 hours.  Lab Results  Component Value Date   HGBA1C 5.1 08/20/2019   ------------------------------------------------------------------------------------------------------------------ No results for input(s): "TSH", "T4TOTAL", "T3FREE", "THYROIDAB" in the last 72 hours.  Invalid input(s): "FREET3"  Cardiac Enzymes No results for input(s): "CKMB", "TROPONINI", "MYOGLOBIN" in the last 168 hours.  Invalid input(s): "CK" ------------------------------------------------------------------------------------------------------------------    Component Value Date/Time   BNP 101.7 (H) 04/13/2021 2325    CBG: Recent Labs  Lab 07/07/22 1201  GLUCAP 110*     Recent Results (from the past 240 hour(s))  Blood culture (routine x 2)     Status: None (Preliminary result)   Collection Time: 07/05/22 10:40 PM   Specimen: BLOOD  Result Value Ref Range Status   Specimen Description BLOOD RIGHT ANTECUBITAL  Final   Special Requests   Final    BOTTLES DRAWN  AEROBIC AND ANAEROBIC Blood Culture adequate volume   Culture   Final    NO GROWTH 3 DAYS Performed at [facility], [place] [address] [place], [place] [address]    Report Status PENDING  Incomplete  Urine Culture     Status: Abnormal   Collection Time: 07/05/22 10:49 PM   Specimen: Urine, Clean Catch  Result Value Ref Range Status   Specimen Description URINE, CLEAN CATCH  Final    Special Requests   Final    NONE Performed at [facility], [place] [address], [place], [name] [address]    Culture >=100,000 COLONIES/mL PSEUDOMONAS AERUGINOSA (A)  Final   Report Status 07/08/2022 FINAL  Final   Organism ID, Bacteria PSEUDOMONAS AERUGINOSA (A)  Final      Susceptibility   Pseudomonas aeruginosa - MIC*    CEFTAZIDIME <=1 SENSITIVE Sensitive     CIPROFLOXACIN 0.5 SENSITIVE Sensitive     GENTAMICIN 4 SENSITIVE Sensitive     IMIPENEM 2 SENSITIVE Sensitive     PIP/TAZO 8 SENSITIVE Sensitive     * >=100,000 COLONIES/mL PSEUDOMONAS AERUGINOSA  Resp Panel by RT-PCR (Flu A&B, Covid) Anterior Nasal Swab     Status: None   Collection Time: 07/05/22 10:50 PM   Specimen: Anterior Nasal Swab  Result Value Ref Range Status   SARS Coronavirus 2 by RT PCR NEGATIVE NEGATIVE Final    Comment: (NOTE) SARS-CoV-2 target nucleic acids are NOT DETECTED.  The SARS-CoV-2 RNA is generally detectable in upper respiratory specimens during the acute phase of infection. The lowest concentration of SARS-CoV-2 viral copies this assay can detect is 138 copies/mL. A negative result does not preclude SARS-Cov-2 infection and should not be used as the sole basis for treatment or other patient management decisions. A negative result may occur with  improper specimen collection/handling, submission of specimen other than nasopharyngeal swab, presence of viral mutation(s) within the areas targeted by this assay, and inadequate number of viral copies(<138 copies/mL). A negative result must be combined with clinical observations, patient history, and epidemiological information. The expected result is Negative.  Fact Sheet for Patients:  EntrepreneurPulse.com.au  Fact Sheet for Healthcare Providers:  IncredibleEmployment.be  This test is no t yet approved or cleared by the [place] FDA and  has been authorized for detection and/or diagnosis of  SARS-CoV-2 by FDA under an Emergency Use Authorization (EUA). This EUA will remain  in effect (meaning this test can be used) for the duration of the COVID-19 declaration under Section 564(b)(1) of the Act, 21 U.S.C.section 360bbb-3(b)(1), unless the authorization is terminated  or revoked sooner.       Influenza A by PCR NEGATIVE NEGATIVE Final   Influenza B by PCR NEGATIVE NEGATIVE Final    Comment: (NOTE) The Xpert Xpress SARS-CoV-2/FLU/RSV plus assay is intended as an aid in the diagnosis of influenza from Nasopharyngeal swab specimens and should not be used as a sole basis for treatment. Nasal washings and aspirates are unacceptable for Xpert Xpress SARS-CoV-2/FLU/RSV testing.  Fact Sheet for Patients: EntrepreneurPulse.com.au  Fact Sheet for Healthcare Providers: IncredibleEmployment.be  This test is not yet approved or cleared by the [place] FDA and has been authorized for detection and/or diagnosis of SARS-CoV-2 by FDA under an Emergency Use Authorization (EUA). This EUA will remain in effect (meaning this test can be used) for the duration of the COVID-19 declaration under Section 564(b)(1) of the Act, 21 U.S.C. section 360bbb-3(b)(1), unless the authorization is terminated or revoked.  Performed  at [facility], [place] [address] [place], [place] [address]   Blood culture (routine x 2)     Status: None (Preliminary result)   Collection Time: 07/05/22 10:58 PM   Specimen: BLOOD  Result Value Ref Range Status   Specimen Description BLOOD SITE NOT SPECIFIED  Final   Special Requests   Final    BOTTLES DRAWN AEROBIC AND ANAEROBIC Blood Culture adequate volume   Culture   Final    NO GROWTH 3 DAYS Performed at [facility], [address] [place], [place] [address]    Report Status PENDING  Incomplete  MRSA Next Gen by PCR, Nasal     Status: None   Collection Time: 07/06/22  4:10 AM   Specimen: Nasal Mucosa;  Nasal Swab  Result Value Ref Range Status   MRSA by PCR Next Gen NOT DETECTED NOT DETECTED Final    Comment: (NOTE) The GeneXpert MRSA Assay (FDA approved for NASAL specimens only), is one component of a comprehensive MRSA colonization surveillance program. It is not intended to diagnose MRSA infection nor to guide or monitor treatment for MRSA infections. Test performance is not FDA approved in patients less than 70 years old. Performed at [facility], [place] [address] [place], [place] [address]    Radiology Studies: No results found.  Marzetta Board, MD, PhD [facility]  Between 7 am - 7 pm I am available, please contact me via Amion (for emergencies) or Securechat (non urgent messages)  Between 7 pm - 7 am I am not available, please contact night coverage MD/APP via Amion

## 2022-07-08 NOTE — Plan of Care (Signed)
  Problem: Education: Goal: Knowledge of General Education information will improve Description Including pain rating scale, medication(s)/side effects and non-pharmacologic comfort measures Outcome: Progressing   Problem: Health Behavior/Discharge Planning: Goal: Ability to manage health-related needs will improve Outcome: Progressing   

## 2022-07-09 DIAGNOSIS — Z7401 Bed confinement status: Secondary | ICD-10-CM | POA: Diagnosis not present

## 2022-07-09 DIAGNOSIS — Z743 Need for continuous supervision: Secondary | ICD-10-CM | POA: Diagnosis not present

## 2022-07-09 DIAGNOSIS — R404 Transient alteration of awareness: Secondary | ICD-10-CM | POA: Diagnosis not present

## 2022-07-09 DIAGNOSIS — J690 Pneumonitis due to inhalation of food and vomit: Secondary | ICD-10-CM | POA: Diagnosis not present

## 2022-07-09 LAB — BASIC METABOLIC PANEL
Anion gap: 4 — ABNORMAL LOW (ref 5–15)
BUN: 41 mg/dL — ABNORMAL HIGH (ref 8–23)
CO2: 16 mmol/L — ABNORMAL LOW (ref 22–32)
Calcium: 8.8 mg/dL — ABNORMAL LOW (ref 8.9–10.3)
Chloride: 123 mmol/L — ABNORMAL HIGH (ref 98–111)
Creatinine, Ser: 2.46 mg/dL — ABNORMAL HIGH (ref 0.61–1.24)
GFR, Estimated: 27 mL/min — ABNORMAL LOW (ref >60)
Glucose, Bld: 94 mg/dL (ref 70–99)
Potassium: 4.2 mmol/L (ref 3.5–5.1)
Sodium: 143 mmol/L (ref 135–145)

## 2022-07-09 MED ORDER — FUROSEMIDE 10 MG/ML IJ SOLN
40.0000 mg | Freq: Once | INTRAMUSCULAR | Status: AC
  Administered 2022-07-09: 40 mg via INTRAVENOUS
  Filled 2022-07-09: qty 4

## 2022-07-09 MED ORDER — CIPROFLOXACIN HCL 500 MG PO TABS: 500.0000 mg | ORAL_TABLET | Freq: Every day | ORAL | 0 refills | Status: AC

## 2022-07-09 MED ORDER — AUGMENTIN 125-31.25 MG/5ML PO SUSR: 875.0000 mg | Freq: Two times a day (BID) | ORAL | 0 refills | Status: AC

## 2022-07-09 NOTE — Progress Notes (Signed)
  Subjective:  Patient ID: Darrell [name], male    DOB: 1951-05-23,  MRN: [id number]  [name] presents to clinic today for:  Chief Complaint  Patient presents with   Nail Problem    Routine foot care PCP-[place], [place] PCP VST-1 month ago   He has h/o CVA, ALS and DVT. He is on Eliquis.   Patient is accompanied by his wife, Mrs. Jawann Urbani. He has been brought to appointment by transportation. Wife states they are waiting on the arrival of a high back reclining wheelchair.  PCP is [name], MD , and last visit was  June 07, 2022.  Allergies  Allergen Reactions   Ace Inhibitors Other (See Comments)    Hyperkalemia   Doxycycline Other (See Comments)    Hiccups, cough, nausea and emesis, elevated liver enzymes, elevated eosinophils, SOB concerning for early DRESS syndrome    At[place]and Hct [Candesartan Cilexetil-Hctz] Hives   Shellfish Allergy Hives    Review of Systems: Negative except as noted in the HPI.  Objective: No changes noted in today's physical examination.  Darrell [name] is a pleasant 71 y.o. male frail, in NAD. AAO x 3.  Vascular Examination: Capillary refill time immediate b/l. Vascular status intact b/l with palpable pedal pulses. Pedal hair absent b/l. No edema. No pain with calf compression b/l. Skin temperature gradient WNL b/l. No cyanosis or clubbing noted b/l LE.  Neurological Examination: Sensation grossly intact b/l with 10 gram monofilament. Vibratory sensation intact b/l.   Dermatological Examination: Pedal skin with normal turgor, texture and tone b/l. Toenails 1-5 b/l thick, discolored, elongated with subungual debris and pain on dorsal palpation. No open wounds b/l LE. No interdigital macerations noted b/l LE. No hyperkeratotic nor porokeratotic lesions present on today's visit.  Musculoskeletal Examination: Noted disuse atrophy b/l lower extremities. Utilizes wheelchair for mobility assistance.  Radiographs:  None  Assessment/Plan: 1. Pain due to onychomycosis of toenails of both feet     No orders of the defined types were placed in this encounter.   -Patient's family member present. All questions/concerns addressed on today's visit. -Examined patient. -Toenails 1-5 bilaterally were debrided in length and girth with sterile nail nippers and dremel. Pinpoint bleeding of L 3rd toe addressed with Lumicain Hemostatic Solution, cleansed with alcohol. Dressing applied which his wife may remove tonight. No further treatment required by patient/caregiver. -Patient/POA to call should there be question/concern in the interim.   Return in about 3 months (around 10/04/2022).  Marzetta Board, DPM

## 2022-07-09 NOTE — Discharge Summary (Addendum)
Physician Discharge Summary  Darrell [name]:[id number] DOB: 1951-02-14 DOA: 07/05/2022  PCP: [name], MD  Admit date: 07/05/2022 Discharge date: 07/09/2022  Admitted From: home Disposition:  home  Recommendations for Outpatient Follow-up:  Follow up with PCP in 1-2 weeks Please obtain BMP/CBC in one week  Home Health: none Equipment/Devices: none  Discharge Condition: stable CODE STATUS: DNR Diet Orders (From admission, onward)     Start     Ordered   07/06/22 0241  DIET - DYS 1 Room service appropriate? Yes; Fluid consistency: Thin  Diet effective now       Question Answer Comment  Room service appropriate? Yes   Fluid consistency: Thin      07/06/22 0241            HPI: 71 year old male with prior CVA, ALS, anemia, HTN, CKD 4, seizure disorder comes to the hospital for generalized weakness.  He was also having a cough, and chest x-ray done as an outpatient was concern for pneumonia.  He continued to get worse despite being placed on antibiotics, became weaker and sleeping most of the time, and eventually was brought to the hospital.  He was also found to be hyperkalemic.  He has advanced ALS at baseline, and per prior notes he is aspirating most times.   Hospital Course / Discharge diagnoses: Principal Problem:   Aspiration pneumonia ([place]) Active Problems:   Hyperkalemia   Catheter-associated urinary tract infection ([name])   Dysphagia   ALS (amyotrophic lateral sclerosis) ([name])   Acute metabolic encephalopathy   Urinary retention   Seizure disorder ([name])   CKD (chronic kidney disease), stage IV ([name])   Anemia, chronic disease   DNR (do not resuscitate)   DVT (deep venous thrombosis) ([place])   Principal problem Aspiration pneumonia-patient was placed on antibiotics with improvement in his mentation.  He is now afebrile, on room air, tolerating some feeding without obvious large aspiration at least clinically.  He is afebrile, WBC is normal, he will  be transitioned to Augmentin and discharged home in stable condition.  He is at high risk for recurrent aspiration   Active problems Catheter associated urinary tract infection-cultures showing Pseudomonas.  He was maintained on cefepime while hospitalized, transition to ciprofloxacin on discharge based on sensitivities. Urinary retention-chronic, Foley replaced on admission  Acute metabolic encephalopathy-due to infectious process, now alert, back to baseline  Hyperkalemia-in the setting of chronic kidney disease.  Potassium improved with Lokelma.  Recommend repeating labs in a week as an outpatient CKD 4-baseline creatinine 2.7-3.3, at baseline Seizure disorder-continue Keppra Goals of care-palliative care consulted, I have also discussed with the wife at bedside.  He has recurrent aspiration pneumonia and there is no good way to prevent this in the future.  He is likely aspirating his own saliva when he is sleeping.  It does not sound like she is ready for hospice based on my discussion with her as well as the palliative care team.  Encouraged the wife to talk to the patient as he is alert and oriented, have him express his wishes.  Sepsis ruled out   Discharge Instructions   Allergies as of 07/09/2022       Reactions   Ace Inhibitors Other (See Comments)   Hyperkalemia   Doxycycline Other (See Comments)   Hiccups, cough, nausea and emesis, elevated liver enzymes, elevated eosinophils, SOB concerning for early DRESS syndrome    Atacand Hct [candesartan Cilexetil-hctz] Hives   Shellfish Allergy Hives  Medication List     STOP taking these medications    azithromycin 250 MG tablet Commonly known as: ZITHROMAX       TAKE these medications    acetaminophen 325 MG tablet Commonly known as: TYLENOL Take 325 mg by mouth daily as needed for mild pain or moderate pain.   albuterol 108 (90 Base) MCG/ACT inhaler Commonly known as: VENTOLIN HFA Inhale 2 puffs into the  lungs every 6 (six) hours as needed for up to 30 days for Wheezing.   amLODipine 10 MG tablet Commonly known as: NORVASC Take 1 tablet (10 mg total) by mouth daily.   apixaban 5 MG Tabs tablet Commonly known as: ELIQUIS Take 1 tablet (5 mg total) by mouth 2 (two) times daily. Upon completion of starter pack   atorvastatin 40 MG tablet Commonly known as: LIPITOR Take 1 tablet (40 mg total) by mouth daily.   Augmentin 125-31.25 MG/5ML suspension Generic drug: amoxicillin-clavulanate Take 35 mLs (875 mg total) by mouth 2 (two) times daily for 4 days.   Breo Ellipta 200-25 MCG/ACT Aepb Generic drug: fluticasone furoate-vilanterol Inhale 2 puffs into the lungs daily as needed (for shortness of breath).   brimonidine 0.2 % ophthalmic solution Commonly known as: ALPHAGAN Place 1 drop into both eyes daily.   cetirizine 10 MG tablet Commonly known as: ZYRTEC Take 10 mg by mouth daily as needed for allergies.   ciprofloxacin 500 MG tablet Commonly known as: Cipro Take 1 tablet (500 mg total) by mouth daily with breakfast for 4 days.   dicyclomine 10 MG capsule Commonly known as: BENTYL Take 10 mg by mouth daily as needed for spasms (pain).   dorzolamide-timolol 2-0.5 % ophthalmic solution Commonly known as: COSOPT Place 1 drop into both eyes daily.   ferrous sulfate 325 (65 FE) MG tablet Commonly known as: FeroSul Take 1 tablet (325 mg total) by mouth 2 (two) times daily with a meal.   finasteride 5 MG tablet Commonly known as: PROSCAR Take 5 mg by mouth daily.   fluticasone 50 MCG/ACT nasal spray Commonly known as: FLONASE Place 1 spray into both nostrils daily as needed for allergies.   glycopyrrolate 1 MG tablet Commonly known as: ROBINUL Take 1 tablet (1 mg total) by mouth 2 times daily.   guaiFENesin 600 MG 12 hr tablet Commonly known as: MUCINEX Take 600 mg by mouth daily.   hydrALAZINE 25 MG tablet Commonly known as: APRESOLINE Take 1 tablet (25 mg total)  by mouth 3 (three) times daily.   hydrocortisone 2.5 % rectal cream Commonly known as: ANUSOL-HC Place 1 Application rectally 2 (two) times daily.   latanoprost 0.005 % ophthalmic solution Commonly known as: XALATAN Place 1 drop into both eyes at bedtime.   levETIRAcetam 500 MG tablet Commonly known as: KEPPRA Take 1 tablet (500 mg total) by mouth 2 (two) times daily.   metoprolol succinate 50 MG 24 hr tablet Commonly known as: TOPROL-XL Take 1 tablet (50 mg total) by mouth at bedtime. Take with or immediately following a meal.   montelukast 10 MG tablet Commonly known as: SINGULAIR Take 1 tablet by mouth at bedtime.   Multi-Vitamin tablet Take 1 tablet by mouth daily.   ondansetron 4 MG tablet Commonly known as: Zofran Take 1 tablet (4 mg total) by mouth every 8 (eight) hours as needed for nausea or vomiting.   polyethylene glycol powder 17 GM/SCOOP powder Commonly known as: GLYCOLAX/MIRALAX Take 17 g by mouth daily. Follow instructions in the bottle. Use  no more than 7 days. PRN for occasional constipation.   tamsulosin 0.4 MG Caps capsule Commonly known as: FLOMAX Take 1 capsule (0.4 mg total) by mouth daily.   torsemide 20 MG tablet Commonly known as: DEMADEX Take 20 mg by mouth daily as needed (fluid).       Consultations: Palliative care  Procedures/Studies:  DG Chest Port 1 View  Result Date: 07/05/2022 CLINICAL DATA:  Recent pneumonia, weakness EXAM: PORTABLE CHEST 1 VIEW COMPARISON:  Chest x-ray 10 02/2022 FINDINGS: Left pacemaker is unchanged in position. The heart is enlarged, unchanged. There is some minimal patchy opacities in the left mid lung. The lungs are otherwise clear. No pleural effusion or pneumothorax. No acute fractures. IMPRESSION: 1. Minimal patchy opacities in the left mid lung, which may represent atelectasis or infection. 2. Stable cardiomegaly. Electronically Signed   By: Ronney Asters M.D.   On: 07/05/2022 23:12   DG Abd 1  View  Result Date: 06/30/2022 CLINICAL DATA:  Constipation, possible hemorrhoids EXAM: ABDOMEN - 1 VIEW COMPARISON:  04/12/2022 FINDINGS: 2 supine frontal views of the abdomen and pelvis excludes the hemidiaphragms by collimation. There is moderate retained stool throughout the colon consistent with constipation. No bowel obstruction or ileus. There are no masses or abnormal calcifications. No acute bony abnormality. IMPRESSION: 1. Moderate fecal retention consistent with constipation. 2. No obstruction or ileus. Electronically Signed   By: Randa Ngo M.D.   On: 06/30/2022 17:33   DG Chest 2 View  Result Date: 06/30/2022 CLINICAL DATA:  Deep cough. EXAM: CHEST - 2 VIEW COMPARISON:  05/17/2022 FINDINGS: Stable mildly enlarged cardiac silhouette and left subclavian bipolar pacemaker leads. Ill-defined increased density at the left lateral lung base without significant change. The remainder of the lungs are clear. Unremarkable bones. IMPRESSION: Stable mild cardiomegaly and left basilar atelectasis, pneumonia or scarring. Electronically Signed   By: Claudie Revering M.D.   On: 06/30/2022 16:35   CUP PACEART REMOTE DEVICE CHECK  Result Date: 06/21/2022 Scheduled remote reviewed. Normal device function.  1 NSVT, 7 beats Next remote 91 days. [place]    Subjective: - no chest pain, shortness of breath, no abdominal pain, nausea or vomiting.   Discharge Exam: BP 137/63 (BP Location: Right Arm)   Pulse (!) 59   Temp 97.9 F (36.6 C) (Oral)   Resp 18   Ht '6\' 3"'$  (1.905 m)   Wt 81.6 kg   SpO2 96%   BMI 22.50 kg/m   General: Pt is alert, awake, not in acute distress Cardiovascular: RRR, S1/S2 +, no rubs, no gallops Respiratory: CTA bilaterally, no wheezing, no rhonchi Abdominal: Soft, NT, ND, bowel sounds + Extremities: no edema, no cyanosis    The results of significant diagnostics from this hospitalization (including imaging, microbiology, ancillary and laboratory) are listed below for reference.      Microbiology: Recent Results (from the past 240 hour(s))  Blood culture (routine x 2)     Status: None (Preliminary result)   Collection Time: 07/05/22 10:40 PM   Specimen: BLOOD  Result Value Ref Range Status   Specimen Description BLOOD RIGHT ANTECUBITAL  Final   Special Requests   Final    BOTTLES DRAWN AEROBIC AND ANAEROBIC Blood Culture adequate volume   Culture   Final    NO GROWTH 3 DAYS Performed at [facility], [address] [place], [place] [address]    Report Status PENDING  Incomplete  Urine Culture     Status: Abnormal   Collection  Time: 07/05/22 10:49 PM   Specimen: Urine, Clean Catch  Result Value Ref Range Status   Specimen Description URINE, CLEAN CATCH  Final   Special Requests   Final    NONE Performed at [facility], [address] [place], [name] [address]    Culture >=100,000 COLONIES/mL PSEUDOMONAS AERUGINOSA (A)  Final   Report Status 07/08/2022 FINAL  Final   Organism ID, Bacteria PSEUDOMONAS AERUGINOSA (A)  Final      Susceptibility   Pseudomonas aeruginosa - MIC*    CEFTAZIDIME <=1 SENSITIVE Sensitive     CIPROFLOXACIN 0.5 SENSITIVE Sensitive     GENTAMICIN 4 SENSITIVE Sensitive     IMIPENEM 2 SENSITIVE Sensitive     PIP/TAZO 8 SENSITIVE Sensitive     * >=100,000 COLONIES/mL PSEUDOMONAS AERUGINOSA  Resp Panel by RT-PCR (Flu A&B, Covid) Anterior Nasal Swab     Status: None   Collection Time: 07/05/22 10:50 PM   Specimen: Anterior Nasal Swab  Result Value Ref Range Status   SARS Coronavirus 2 by RT PCR NEGATIVE NEGATIVE Final    Comment: (NOTE) SARS-CoV-2 target nucleic acids are NOT DETECTED.  The SARS-CoV-2 RNA is generally detectable in upper respiratory specimens during the acute phase of infection. The lowest concentration of SARS-CoV-2 viral copies this assay can detect is 138 copies/mL. A negative result does not preclude SARS-Cov-2 infection and should not be used as the sole basis for treatment or other patient  management decisions. A negative result may occur with  improper specimen collection/handling, submission of specimen other than nasopharyngeal swab, presence of viral mutation(s) within the areas targeted by this assay, and inadequate number of viral copies(<138 copies/mL). A negative result must be combined with clinical observations, patient history, and epidemiological information. The expected result is Negative.  Fact Sheet for Patients:  EntrepreneurPulse.com.au  Fact Sheet for Healthcare Providers:  IncredibleEmployment.be  This test is no t yet approved or cleared by the [place] FDA and  has been authorized for detection and/or diagnosis of SARS-CoV-2 by FDA under an Emergency Use Authorization (EUA). This EUA will remain  in effect (meaning this test can be used) for the duration of the COVID-19 declaration under Section 564(b)(1) of the Act, 21 U.S.C.section 360bbb-3(b)(1), unless the authorization is terminated  or revoked sooner.       Influenza A by PCR NEGATIVE NEGATIVE Final   Influenza B by PCR NEGATIVE NEGATIVE Final    Comment: (NOTE) The Xpert Xpress SARS-CoV-2/FLU/RSV plus assay is intended as an aid in the diagnosis of influenza from Nasopharyngeal swab specimens and should not be used as a sole basis for treatment. Nasal washings and aspirates are unacceptable for Xpert Xpress SARS-CoV-2/FLU/RSV testing.  Fact Sheet for Patients: EntrepreneurPulse.com.au  Fact Sheet for Healthcare Providers: IncredibleEmployment.be  This test is not yet approved or cleared by the [place] FDA and has been authorized for detection and/or diagnosis of SARS-CoV-2 by FDA under an Emergency Use Authorization (EUA). This EUA will remain in effect (meaning this test can be used) for the duration of the COVID-19 declaration under Section 564(b)(1) of the Act, 21 U.S.C. section 360bbb-3(b)(1),  unless the authorization is terminated or revoked.  Performed at [facility], [name] [address] [place], [place] [address]   Blood culture (routine x 2)     Status: None (Preliminary result)   Collection Time: 07/05/22 10:58 PM   Specimen: BLOOD  Result Value Ref Range Status   Specimen Description BLOOD SITE NOT SPECIFIED  Final   Special Requests   Final    BOTTLES DRAWN AEROBIC AND ANAEROBIC Blood Culture adequate volume   Culture   Final    NO GROWTH 3 DAYS Performed at [facility], [address] [place], [place] [address]    Report Status PENDING  Incomplete  MRSA Next Gen by PCR, Nasal     Status: None   Collection Time: 07/06/22  4:10 AM   Specimen: Nasal Mucosa; Nasal Swab  Result Value Ref Range Status   MRSA by PCR Next Gen NOT DETECTED NOT DETECTED Final    Comment: (NOTE) The GeneXpert MRSA Assay (FDA approved for NASAL specimens only), is one component of a comprehensive MRSA colonization surveillance program. It is not intended to diagnose MRSA infection nor to guide or monitor treatment for MRSA infections. Test performance is not FDA approved in patients less than 48 years old. Performed at [facility], [place] [address] [place], [place] [address]      Labs: Basic Metabolic Panel: Recent Labs  Lab 07/05/22 2240 07/05/22 2355 07/06/22 0408 07/07/22 0417 07/07/22 1616 07/08/22 0237 07/09/22 0356  NA 138  --  140 139  --  137 143  K 6.2*   < > 6.1* 5.5* 5.3* 4.9 4.2  CL 111  --  112* 118*  --  117* 123*  CO2 21*  --  18* 18*  --  16* 16*  GLUCOSE 98  --  96 99  --  93 94  BUN 51*  --  48* 46*  --  44* 41*  CREATININE 2.80*  --  2.65* 2.64*  --  2.48* 2.46*  CALCIUM 9.4  --  9.9 8.9  --  8.8* 8.8*  MG  --   --   --  1.9  --  1.8  --    < > = values in this interval not displayed.   Liver Function Tests: Recent Labs  Lab 07/05/22 2240 07/07/22 0417  AST 18 11*  ALT 16 14  ALKPHOS 67 60  BILITOT 0.4 0.3  PROT 6.0* 5.4*   ALBUMIN 2.9* 2.5*   CBC: Recent Labs  Lab 07/05/22 2240 07/06/22 0408 07/07/22 0417  WBC 8.8 9.6 7.2  NEUTROABS 5.4  --   --   HGB 8.9* 9.1* 8.5*  HCT 28.0* 28.7* 27.3*  MCV 90.0 90.3 91.3  PLT 287 266 244   CBG: Recent Labs  Lab 07/07/22 1201  GLUCAP 110*   Hgb A1c No results for input(s): "HGBA1C" in the last 72 hours. Lipid Profile No results for input(s): "CHOL", "HDL", "LDLCALC", "TRIG", "CHOLHDL", "LDLDIRECT" in the last 72 hours. Thyroid function studies No results for input(s): "TSH", "T4TOTAL", "T3FREE", "THYROIDAB" in the last 72 hours.  Invalid input(s): "FREET3" Urinalysis    Component Value Date/Time   COLORURINE YELLOW 07/05/2022 2249   APPEARANCEUR CLOUDY (A) 07/05/2022 2249   LABSPEC 1.008 07/05/2022 2249   PHURINE 6.0 07/05/2022 2249   GLUCOSEU NEGATIVE 07/05/2022 2249   HGBUR LARGE (A) 07/05/2022 2249   BILIRUBINUR NEGATIVE 07/05/2022 2249   BILIRUBINUR negative 09/07/2017 1645   KETONESUR NEGATIVE 07/05/2022 2249   PROTEINUR 30 (A) 07/05/2022 2249   UROBILINOGEN 0.2 09/07/2017 1645   UROBILINOGEN 4.0 (H) 08/04/2016 1517   NITRITE POSITIVE (A) 07/05/2022 2249   LEUKOCYTESUR LARGE (A) 07/05/2022 2249    FURTHER DISCHARGE INSTRUCTIONS:   Get Medicines reviewed and adjusted: Please take all your medications with you for your next visit with your Primary  MD   Laboratory/radiological data: Please request your Primary MD to go over all hospital tests and procedure/radiological results at the follow up, please ask your Primary MD to get all Hospital records sent to his/her office.   In some cases, they will be blood work, cultures and biopsy results pending at the time of your discharge. Please request that your primary care M.D. goes through all the records of your hospital data and follows up on these results.   Also Note the following: If you experience worsening of your admission symptoms, develop shortness of breath, life threatening  emergency, suicidal or homicidal thoughts you must seek medical attention immediately by calling 911 or calling your MD immediately  if symptoms less severe.   You must read complete instructions/literature along with all the possible adverse reactions/side effects for all the Medicines you take and that have been prescribed to you. Take any new Medicines after you have completely understood and accpet all the possible adverse reactions/side effects.    Do not drive when taking Pain medications or sleeping medications (Benzodaizepines)   Do not take more than prescribed Pain, Sleep and Anxiety Medications. It is not advisable to combine anxiety,sleep and pain medications without talking with your primary care practitioner   Special Instructions: If you have smoked or chewed Tobacco  in the last 2 yrs please stop smoking, stop any regular Alcohol  and or any Recreational drug use.   Wear Seat belts while driving.   Please note: You were cared for by a hospitalist during your hospital stay. Once you are discharged, your primary care physician will handle any further medical issues. Please note that NO REFILLS for any discharge medications will be authorized once you are discharged, as it is imperative that you return to your primary care physician (or establish a relationship with a primary care physician if you do not have one) for your post hospital discharge needs so that they can reassess your need for medications and monitor your lab values.  Time coordinating discharge: 40 minutes  SIGNED:  Marzetta Board, MD, PhD 07/09/2022, 9:29 AM

## 2022-07-09 NOTE — Plan of Care (Signed)
  Problem: Health Behavior/Discharge Planning: Goal: Ability to manage health-related needs will improve Outcome: Progressing   

## 2022-07-09 NOTE — TOC Transition Note (Addendum)
Transition of Care [facility]) - CM/SW Discharge Note   Patient Details  Name: Darrell [name] MRN: [id number] Date of Birth: [birth year]  [facility]) CM/SW Contact:  Bartholomew Crews, RN Phone Number: [phone] 07/09/2022, 2:36 PM   Clinical Narrative:     Patient to transition home today. Spoke with wife at the bedside. Additional family members assist with patient's care at home. Patient had been receiving care from [facility], , prior to admission. [facility] following for palliative care. Patient will need EMS for transport home - verified home address - PTAR arranged and medical transport paperwork complete. Patient will need signed DNR. No further TOC needs identified at this time.   UPDATE: Received confirmation from liaison at [facility] that patient is active with SN and PT. HH resumption orders placed and sent to MD.   Final next level of care: Other (comment) ([facility] - palliative) Barriers to Discharge: No Barriers Identified   Patient Goals and CMS Choice Patient states their goals for this hospitalization and ongoing recovery are:: return home with family CMS Medicare.gov Compare Post Acute Care list provided to:: Patient Represenative (must comment) (spouse) Choice offered to / list presented to : Spouse  Discharge Placement                       Discharge Plan and Services                DME Arranged: N/A DME Agency: NA                  Social Determinants of Health ([place]) Interventions     Readmission Risk Interventions    05/19/2022    3:24 PM  Readmission Risk Prevention Plan  Transportation Screening Complete  Medication Review ([place]) Complete  HRI or [place] Complete  [place] Not Applicable

## 2022-07-10 ENCOUNTER — Telehealth: Payer: Self-pay

## 2022-07-10 LAB — CULTURE, BLOOD (ROUTINE X 2)
Culture: NO GROWTH
Culture: NO GROWTH
Special Requests: ADEQUATE
Special Requests: ADEQUATE

## 2022-07-10 NOTE — Patient Outreach (Addendum)
  Care Coordination TOC Note Transition Care Management Follow-up Telephone Call Date of discharge and from where: 07/09/22-[place] How have you been since you were released from the hospital? Call completed with spouse. She reports patient is still asleep/resting. She is wondering if he should wake him up to keep him on schedule. Discussed with spouse that patient has been in the hospital and just returning home and the importance of rest/sleep to aide in recovery. If patient has not appts today then there is no need to wake him up at present. She agrees. She voices that someone is coming later to look at this hospital bed as it is not working properly.  Any questions or concerns? Yes-she voices that pharmacy called her yesterday evening to advise her that Augmentin liquid form would cost about $2000. Pharmacist is working with MD office to get med changed to something much cheaper.she will follow up with pharmacy later today. She was able to get other abx-Cipro without any issues.  Items Reviewed: Did the pt receive and understand the discharge instructions provided? Yes  Medications obtained and verified? Yes  Other? No  Any new allergies since your discharge? No  Dietary orders reviewed? Yes Do you have support at home? Yes   Home Care and Equipment/Supplies: Were home health services ordered? yes If so, what is the name of the agency? [facility]  Has the agency set up a time to come to the patient's home? Yes-visit scheduled for this afternoon Were any new equipment or medical supplies ordered?  No What is the name of the medical supply agency? N/A Were you able to get the supplies/equipment? N/A Do you have any questions related to the use of the equipment or supplies? No  Functional Questionnaire: (I = Independent and D = Dependent) ADLs: D  Bathing/Dressing- D  Meal Prep- D  Eating- Assist  Maintaining continence- D  Transferring/Ambulation- D  Managing Meds-  D  Follow up appointments reviewed:  PCP Hospital f/u appt confirmed? Pt. Discharged yesterday-wife will call PCP office today. [facility] f/u appt confirmed? Scheduled to see  Dr. Cicero Duck on 07/24/22 @ 3:30pm. Are transportation arrangements needed? No -Spouse reports she had been using transportation provided by [facility]. She will call back if she has any issues setting up transportation. If their condition worsens, is the pt aware to call PCP or go to the Emergency Dept.? Yes Was the patient provided with contact information for the PCP's office or ED? Yes Was to pt encouraged to call back with questions or concerns? Yes  SDOH assessments and interventions completed:   Yes  Care Coordination Interventions Activated:  Yes   Care Coordination Interventions:  Referred for Care Coordination Services:  RN Care Coordinator Education provided    Encounter Outcome:  Pt. Visit Completed    Enzo Montgomery, RN,BSN,CCM [facility] Telephonic Care Management Coordinator Direct Phone: [phone] Toll Free: [phone] Fax: [phone]

## 2022-07-10 NOTE — Patient Instructions (Signed)
Visit Information  Thank you for taking time to visit with me today. Please don't hesitate to contact me if I can be of assistance to you.     Your next appointment is by telephone on 07/20/22 at 10am with Clarise Cruz  Please call the care guide team at [phone] if you need to cancel or reschedule your appointment.   If you are experiencing a Mental Health or [place] or need someone to talk to, please call the [place] National Suicide Prevention Lifeline: (450)084-6144 or TTY: [phone] TTY [phone]) to talk to a trained counselor  Patient verbalizes understanding of instructions and care plan provided today and agrees to view in [place]. Active MyChart status and patient understanding of how to access instructions and care plan via MyChart confirmed with patient.     Telephone follow up appointment with care management team member scheduled for: 07/20/22

## 2022-07-11 ENCOUNTER — Other Ambulatory Visit: Payer: Self-pay | Admitting: Physician Assistant

## 2022-07-11 DIAGNOSIS — N184 Chronic kidney disease, stage 4 (severe): Secondary | ICD-10-CM

## 2022-07-11 DIAGNOSIS — D638 Anemia in other chronic diseases classified elsewhere: Secondary | ICD-10-CM

## 2022-07-11 NOTE — Telephone Encounter (Signed)
Refilled 06/08/2022 #[id number] rf. Requested Prescriptions  Pending Prescriptions Disp Refills  . ferrous sulfate 325 (65 FE) MG EC tablet [Pharmacy Med Name: Ferrous Sulfate 325 (65 Fe) MG Oral Tablet Delayed Release] 60 tablet 0    Sig: TAKE 1 TABLET BY MOUTH TWICE DAILY WITH A MEAL     Endocrinology:  Minerals - Iron Supplementation Failed - 07/11/2022 10:17 AM      Failed - HGB in normal range and within 360 days    Hemoglobin  Date Value Ref Range Status  07/07/2022 8.5 (L) 13.0 - 17.0 g/dL Final  10/27/2021 10.3 (L) 13.0 - 17.7 g/dL Final         Failed - HCT in normal range and within 360 days    HCT  Date Value Ref Range Status  07/07/2022 27.3 (L) 39.0 - 52.0 % Final   Hematocrit  Date Value Ref Range Status  10/27/2021 30.5 (L) 37.5 - 51.0 % Final         Failed - RBC in normal range and within 360 days    RBC  Date Value Ref Range Status  07/07/2022 2.99 (L) 4.22 - 5.81 MIL/uL Final         Failed - Fe (serum) in normal range and within 360 days    Iron  Date Value Ref Range Status  10/10/2021 36 (L) 45 - 182 ug/dL Final   Saturation Ratios  Date Value Ref Range Status  10/10/2021 21 17.9 - 39.5 % Final         Passed - Ferritin in normal range and within 360 days    Ferritin  Date Value Ref Range Status  10/10/2021 176 24 - 336 ng/mL Final    Comment:    Performed at [facility], [place] [address] [place], [place] [address]         Passed - Valid encounter within last 12 months    Recent Outpatient Visits          1 month ago Urinary retention   [place], Enobong, MD   5 months ago Need for pneumococcal vaccine   [place], Enobong, MD   8 months ago CKD (chronic kidney disease), stage IV [facility])   [place], Enobong, MD   9 months ago Renal mass, right   [place], Enobong, MD   10 months ago  Renal lesion   [place], MD      Future Appointments            In 2 months [name], MD [place]   In 5 months [place], Earlean Polka, MD [facility]

## 2022-07-13 ENCOUNTER — Telehealth: Payer: Self-pay | Admitting: Emergency Medicine

## 2022-07-13 ENCOUNTER — Ambulatory Visit: Payer: Self-pay | Admitting: *Deleted

## 2022-07-13 DIAGNOSIS — N401 Enlarged prostate with lower urinary tract symptoms: Secondary | ICD-10-CM | POA: Diagnosis not present

## 2022-07-13 DIAGNOSIS — I129 Hypertensive chronic kidney disease with stage 1 through stage 4 chronic kidney disease, or unspecified chronic kidney disease: Secondary | ICD-10-CM | POA: Diagnosis not present

## 2022-07-13 DIAGNOSIS — I69354 Hemiplegia and hemiparesis following cerebral infarction affecting left non-dominant side: Secondary | ICD-10-CM | POA: Diagnosis not present

## 2022-07-13 DIAGNOSIS — R338 Other retention of urine: Secondary | ICD-10-CM | POA: Diagnosis not present

## 2022-07-13 DIAGNOSIS — E782 Mixed hyperlipidemia: Secondary | ICD-10-CM | POA: Diagnosis not present

## 2022-07-13 DIAGNOSIS — N3289 Other specified disorders of bladder: Secondary | ICD-10-CM | POA: Diagnosis not present

## 2022-07-13 DIAGNOSIS — D631 Anemia in chronic kidney disease: Secondary | ICD-10-CM | POA: Diagnosis not present

## 2022-07-13 DIAGNOSIS — G6181 Chronic inflammatory demyelinating polyneuritis: Secondary | ICD-10-CM | POA: Diagnosis not present

## 2022-07-13 DIAGNOSIS — Z466 Encounter for fitting and adjustment of urinary device: Secondary | ICD-10-CM | POA: Diagnosis not present

## 2022-07-13 DIAGNOSIS — N39498 Other specified urinary incontinence: Secondary | ICD-10-CM | POA: Diagnosis not present

## 2022-07-13 DIAGNOSIS — I441 Atrioventricular block, second degree: Secondary | ICD-10-CM | POA: Diagnosis not present

## 2022-07-13 DIAGNOSIS — J9601 Acute respiratory failure with hypoxia: Secondary | ICD-10-CM | POA: Diagnosis not present

## 2022-07-13 DIAGNOSIS — D1803 Hemangioma of intra-abdominal structures: Secondary | ICD-10-CM | POA: Diagnosis not present

## 2022-07-13 DIAGNOSIS — N184 Chronic kidney disease, stage 4 (severe): Secondary | ICD-10-CM | POA: Diagnosis not present

## 2022-07-13 DIAGNOSIS — G1223 Primary lateral sclerosis: Secondary | ICD-10-CM | POA: Diagnosis not present

## 2022-07-13 DIAGNOSIS — H35033 Hypertensive retinopathy, bilateral: Secondary | ICD-10-CM | POA: Diagnosis not present

## 2022-07-13 DIAGNOSIS — M1990 Unspecified osteoarthritis, unspecified site: Secondary | ICD-10-CM | POA: Diagnosis not present

## 2022-07-13 DIAGNOSIS — R1312 Dysphagia, oropharyngeal phase: Secondary | ICD-10-CM | POA: Diagnosis not present

## 2022-07-13 DIAGNOSIS — I82402 Acute embolism and thrombosis of unspecified deep veins of left lower extremity: Secondary | ICD-10-CM | POA: Diagnosis not present

## 2022-07-13 DIAGNOSIS — H401134 Primary open-angle glaucoma, bilateral, indeterminate stage: Secondary | ICD-10-CM | POA: Diagnosis not present

## 2022-07-13 DIAGNOSIS — I69391 Dysphagia following cerebral infarction: Secondary | ICD-10-CM | POA: Diagnosis not present

## 2022-07-13 DIAGNOSIS — I700 Atherosclerosis of aorta: Secondary | ICD-10-CM | POA: Diagnosis not present

## 2022-07-13 DIAGNOSIS — N390 Urinary tract infection, site not specified: Secondary | ICD-10-CM | POA: Diagnosis not present

## 2022-07-13 DIAGNOSIS — G40909 Epilepsy, unspecified, not intractable, without status epilepticus: Secondary | ICD-10-CM | POA: Diagnosis not present

## 2022-07-13 NOTE — Telephone Encounter (Signed)
Copied from [place] [id number]. Topic: General - Inquiry >> Jul 13, 2022 11:47 AM Tiffany B wrote: Reason for CRM:   Caller requesting verbal orders for continuation of care orders for skilled nursing,  2 PRN, 1x 4.   Patient was discharged from [facility] on 07/09/2022 due to pneumonia complications and UTI. Patient lungs sound clear.    Patient Augmentin (liquid form) is out of stock  at [place] and the price was 2,000, caller suggested PCP send RX to a speciality pharmacy and would like PCP nurse to call patient directly.  Patient # [phone]    Patient has 1 pill left of the Cipro.

## 2022-07-13 NOTE — Telephone Encounter (Signed)
Message from Sherron Flemings sent at 07/13/2022 10:19 AM EDT  Summary: discuss medication   Caller states pt was prescribed two antibiotics during a recent hospital stay, the amoxicillin-clavulanate (AUGMENTIN) 125-31.25 MG/5ML  was too expensive   Caller states she does not know how to contact the prescribing hospital provider and inquiring if pt has to take the amoxicillin-clavulanate (AUGMENTIN)  antibiotic or if an more affordable alternative can be prescribed by Dr. Margarita Rana   Please assist further           Call History   Type Contact Phone/Fax User  07/13/2022 10:13 AM EDT Phone (Incoming) [place] (Emergency Contact) [phone] Sherron Flemings

## 2022-07-13 NOTE — Telephone Encounter (Signed)
I have forwarded pt's message to [facility] for Dr. Margarita Rana.  Darrell [name] is requesting a cheaper medication in place of the Augmentin liquid that was prescribed for him from his recent hospital stay.     It's too expensive.

## 2022-07-13 NOTE — Telephone Encounter (Signed)
Duplicate message. 

## 2022-07-13 NOTE — Telephone Encounter (Signed)
Patient Augmentin (liquid form) is out of stock  at [place] and the price was 2,000, caller suggested PCP send RX to a speciality pharmacy and would like PCP nurse to call patient directly.  Patient # [phone]

## 2022-07-14 MED ORDER — AMOXICILLIN 400 MG/5ML PO SUSR: 875.0000 mg | Freq: Two times a day (BID) | ORAL | 0 refills | Status: DC

## 2022-07-14 NOTE — Telephone Encounter (Signed)
Yes ma'am sent. I think the hospitalist was not familiar with the suspension stock bottles that are available in a retail store. Sent with the same instructions (875 mg BID) but for a '400mg'$ /52m stock bottle.

## 2022-07-14 NOTE — Telephone Encounter (Signed)
Darrell [name], can you please send Augmentin to the pharmacy that we will carry this prescription?  Thank you.  From the discharge summary he was just supposed to be on ciprofloxacin for 4 days so after the last pill he should discontinue it.

## 2022-07-14 NOTE — Addendum Note (Signed)
Addended by: Daisy Blossom, Annie Main L on: 07/14/2022 05:03 PM   Modules accepted: Orders

## 2022-07-17 NOTE — Telephone Encounter (Signed)
Noted  

## 2022-07-19 DIAGNOSIS — I129 Hypertensive chronic kidney disease with stage 1 through stage 4 chronic kidney disease, or unspecified chronic kidney disease: Secondary | ICD-10-CM | POA: Diagnosis not present

## 2022-07-19 DIAGNOSIS — R1312 Dysphagia, oropharyngeal phase: Secondary | ICD-10-CM | POA: Diagnosis not present

## 2022-07-19 DIAGNOSIS — I69391 Dysphagia following cerebral infarction: Secondary | ICD-10-CM | POA: Diagnosis not present

## 2022-07-19 DIAGNOSIS — G1223 Primary lateral sclerosis: Secondary | ICD-10-CM | POA: Diagnosis not present

## 2022-07-19 DIAGNOSIS — D1803 Hemangioma of intra-abdominal structures: Secondary | ICD-10-CM | POA: Diagnosis not present

## 2022-07-19 DIAGNOSIS — E782 Mixed hyperlipidemia: Secondary | ICD-10-CM | POA: Diagnosis not present

## 2022-07-19 DIAGNOSIS — N184 Chronic kidney disease, stage 4 (severe): Secondary | ICD-10-CM | POA: Diagnosis not present

## 2022-07-19 DIAGNOSIS — I69354 Hemiplegia and hemiparesis following cerebral infarction affecting left non-dominant side: Secondary | ICD-10-CM | POA: Diagnosis not present

## 2022-07-19 DIAGNOSIS — G6181 Chronic inflammatory demyelinating polyneuritis: Secondary | ICD-10-CM | POA: Diagnosis not present

## 2022-07-19 DIAGNOSIS — N3289 Other specified disorders of bladder: Secondary | ICD-10-CM | POA: Diagnosis not present

## 2022-07-19 DIAGNOSIS — R338 Other retention of urine: Secondary | ICD-10-CM | POA: Diagnosis not present

## 2022-07-19 DIAGNOSIS — G40909 Epilepsy, unspecified, not intractable, without status epilepticus: Secondary | ICD-10-CM | POA: Diagnosis not present

## 2022-07-19 DIAGNOSIS — D631 Anemia in chronic kidney disease: Secondary | ICD-10-CM | POA: Diagnosis not present

## 2022-07-19 DIAGNOSIS — Z466 Encounter for fitting and adjustment of urinary device: Secondary | ICD-10-CM | POA: Diagnosis not present

## 2022-07-19 DIAGNOSIS — N39498 Other specified urinary incontinence: Secondary | ICD-10-CM | POA: Diagnosis not present

## 2022-07-19 DIAGNOSIS — H35033 Hypertensive retinopathy, bilateral: Secondary | ICD-10-CM | POA: Diagnosis not present

## 2022-07-19 DIAGNOSIS — N401 Enlarged prostate with lower urinary tract symptoms: Secondary | ICD-10-CM | POA: Diagnosis not present

## 2022-07-19 DIAGNOSIS — I441 Atrioventricular block, second degree: Secondary | ICD-10-CM | POA: Diagnosis not present

## 2022-07-19 DIAGNOSIS — J9601 Acute respiratory failure with hypoxia: Secondary | ICD-10-CM | POA: Diagnosis not present

## 2022-07-19 DIAGNOSIS — I82402 Acute embolism and thrombosis of unspecified deep veins of left lower extremity: Secondary | ICD-10-CM | POA: Diagnosis not present

## 2022-07-19 DIAGNOSIS — N390 Urinary tract infection, site not specified: Secondary | ICD-10-CM | POA: Diagnosis not present

## 2022-07-19 DIAGNOSIS — M1990 Unspecified osteoarthritis, unspecified site: Secondary | ICD-10-CM | POA: Diagnosis not present

## 2022-07-19 DIAGNOSIS — H401134 Primary open-angle glaucoma, bilateral, indeterminate stage: Secondary | ICD-10-CM | POA: Diagnosis not present

## 2022-07-19 DIAGNOSIS — I700 Atherosclerosis of aorta: Secondary | ICD-10-CM | POA: Diagnosis not present

## 2022-07-20 ENCOUNTER — Ambulatory Visit: Payer: Self-pay

## 2022-07-20 ENCOUNTER — Telehealth: Payer: Self-pay | Admitting: Family Medicine

## 2022-07-20 NOTE — Patient Outreach (Addendum)
  Care Coordination   Initial Visit Note   07/20/2022 Name: Darrell [name] MRN: [id number] DOB: Aug 06, 1951  Darrell [name] is a 71 y.o. year old male who sees [name], MD for primary care. I spoke with wife Darrell [name] by phone today.  What matters to the patients health and wellness today?  Patient's wife would like to continue to care for patient at home and keep as healthy as possible.     Goals Addressed             This Visit's Progress    I want to keep him healthy       Care Coordination Interventions: Evaluation of current treatment plan related to aspirational pneumonia and patient's adherence to plan as established by provider Determined patient experienced recent IP admission for aspirational pneumonia secondary to having ALS Review of patient status, including review of consultant's reports, relevant laboratory and other test results, and medications completed Determined patient is receiving in home PT;OT;ST through [facility]  Wife is primary caregiver with help of her brother and may benefit from SW referral Educated wife regarding the importance to crush meds unless otherwise directed and use applesauce and or Thick-It as directed Encouraged wife to discuss additional recommendations to help prevent aspiration with ST Educated wife regarding [place], placed outbound call to referral coordinator Reche Dixon RN requesting a return call  Social Work referral for [employer] screening, transportation            SDOH assessments and interventions completed:  Yes  SDOH Interventions Today    Flowsheet Row Most Recent Value  SDOH Interventions   Transportation Interventions Other (Comment)  [wife is having difficulty with transportion for patient's MD appts, SW referral sent]        Care Coordination Interventions Activated:  Yes  Care Coordination Interventions:  Yes, provided   Follow up plan: Referral made to SDOH, transportation Follow up call  scheduled for 08/03/22 '@2'$ :00 PM    Encounter Outcome:  Pt. Visit Completed

## 2022-07-20 NOTE — Telephone Encounter (Signed)
Home Health Verbal Orders - Caller/Agency: Terri Piedra with [place] Number: [phone]  Requesting OT/PT/Skilled Nursing/Social Work/Speech Therapy: PT  Frequency: 1 week 4 times

## 2022-07-20 NOTE — Patient Instructions (Signed)
Visit Information  Thank you for taking time to visit with me today. Please don't hesitate to contact me if I can be of assistance to you.   Following are the goals we discussed today:   Goals Addressed             This Visit's Progress    I want to keep him healthy       Care Coordination Interventions: Evaluation of current treatment plan related to aspirational pneumonia and patient's adherence to plan as established by provider Determined patient experienced recent IP admission for aspirational pneumonia secondary to having ALS Review of patient status, including review of consultant's reports, relevant laboratory and other test results, and medications completed Determined patient is receiving in home PT;OT;ST through [facility]  Wife is primary caregiver with help of her brother and may benefit from SW referral Educated wife regarding the importance to crush meds unless otherwise directed and use applesauce and or Thick-It as directed Encouraged wife to discuss additional recommendations to help prevent aspiration with ST Educated wife regarding [place], placed outbound call to referral coordinator Reche Dixon RN requesting a return call  Social Work referral for SDOH screening, transportation            [place] next appointment is by telephone on 08/03/22 at 2:00 PM  Please call the care guide team at [phone] if you need to cancel or reschedule your appointment.   If you are experiencing a Mental Health or [place] or need someone to talk to, please call 1-800-273-TALK (toll free, 24 hour hotline) go to [facility] [address] [place] [phone])  Patient verbalizes understanding of instructions and care plan provided today and agrees to view in [place]. Active MyChart status and patient understanding of how to access instructions and care plan via MyChart confirmed with patient.     Barb Merino, RN,  BSN, CCM Care Management Coordinator [facility] Care Management Direct Phone: [phone]

## 2022-07-20 NOTE — Telephone Encounter (Signed)
Left message on voicemail to return call.  No name left in voicemail.

## 2022-07-21 ENCOUNTER — Telehealth: Payer: Self-pay | Admitting: Family Medicine

## 2022-07-21 DIAGNOSIS — N390 Urinary tract infection, site not specified: Secondary | ICD-10-CM | POA: Diagnosis not present

## 2022-07-21 DIAGNOSIS — R338 Other retention of urine: Secondary | ICD-10-CM | POA: Diagnosis not present

## 2022-07-21 DIAGNOSIS — M1990 Unspecified osteoarthritis, unspecified site: Secondary | ICD-10-CM | POA: Diagnosis not present

## 2022-07-21 DIAGNOSIS — D1803 Hemangioma of intra-abdominal structures: Secondary | ICD-10-CM | POA: Diagnosis not present

## 2022-07-21 DIAGNOSIS — E782 Mixed hyperlipidemia: Secondary | ICD-10-CM | POA: Diagnosis not present

## 2022-07-21 DIAGNOSIS — D631 Anemia in chronic kidney disease: Secondary | ICD-10-CM | POA: Diagnosis not present

## 2022-07-21 DIAGNOSIS — G6181 Chronic inflammatory demyelinating polyneuritis: Secondary | ICD-10-CM | POA: Diagnosis not present

## 2022-07-21 DIAGNOSIS — H401134 Primary open-angle glaucoma, bilateral, indeterminate stage: Secondary | ICD-10-CM | POA: Diagnosis not present

## 2022-07-21 DIAGNOSIS — I82402 Acute embolism and thrombosis of unspecified deep veins of left lower extremity: Secondary | ICD-10-CM | POA: Diagnosis not present

## 2022-07-21 DIAGNOSIS — N401 Enlarged prostate with lower urinary tract symptoms: Secondary | ICD-10-CM | POA: Diagnosis not present

## 2022-07-21 DIAGNOSIS — N39498 Other specified urinary incontinence: Secondary | ICD-10-CM | POA: Diagnosis not present

## 2022-07-21 DIAGNOSIS — I700 Atherosclerosis of aorta: Secondary | ICD-10-CM | POA: Diagnosis not present

## 2022-07-21 DIAGNOSIS — I69354 Hemiplegia and hemiparesis following cerebral infarction affecting left non-dominant side: Secondary | ICD-10-CM | POA: Diagnosis not present

## 2022-07-21 DIAGNOSIS — H35033 Hypertensive retinopathy, bilateral: Secondary | ICD-10-CM | POA: Diagnosis not present

## 2022-07-21 DIAGNOSIS — I129 Hypertensive chronic kidney disease with stage 1 through stage 4 chronic kidney disease, or unspecified chronic kidney disease: Secondary | ICD-10-CM | POA: Diagnosis not present

## 2022-07-21 DIAGNOSIS — G40909 Epilepsy, unspecified, not intractable, without status epilepticus: Secondary | ICD-10-CM | POA: Diagnosis not present

## 2022-07-21 DIAGNOSIS — I69391 Dysphagia following cerebral infarction: Secondary | ICD-10-CM | POA: Diagnosis not present

## 2022-07-21 DIAGNOSIS — Z466 Encounter for fitting and adjustment of urinary device: Secondary | ICD-10-CM | POA: Diagnosis not present

## 2022-07-21 DIAGNOSIS — G1223 Primary lateral sclerosis: Secondary | ICD-10-CM | POA: Diagnosis not present

## 2022-07-21 DIAGNOSIS — N3289 Other specified disorders of bladder: Secondary | ICD-10-CM | POA: Diagnosis not present

## 2022-07-21 DIAGNOSIS — N184 Chronic kidney disease, stage 4 (severe): Secondary | ICD-10-CM | POA: Diagnosis not present

## 2022-07-21 DIAGNOSIS — J9601 Acute respiratory failure with hypoxia: Secondary | ICD-10-CM | POA: Diagnosis not present

## 2022-07-21 DIAGNOSIS — I441 Atrioventricular block, second degree: Secondary | ICD-10-CM | POA: Diagnosis not present

## 2022-07-21 DIAGNOSIS — R1312 Dysphagia, oropharyngeal phase: Secondary | ICD-10-CM | POA: Diagnosis not present

## 2022-07-21 NOTE — Telephone Encounter (Signed)
Left message on voicemail to return call.  V.O. given.

## 2022-07-21 NOTE — Telephone Encounter (Signed)
Home Health Verbal Orders - Caller/Agency: [place]/ [place] Number: [phone]  Requesting OT/PT/Skilled Nursing/Social Work/Speech Therapy: Speech Therapy (continuance)  Frequency: 1w4

## 2022-07-22 ENCOUNTER — Telehealth: Payer: Medicare HMO | Admitting: Family

## 2022-07-24 ENCOUNTER — Ambulatory Visit: Payer: Self-pay

## 2022-07-24 ENCOUNTER — Institutional Professional Consult (permissible substitution): Payer: Medicare HMO | Admitting: Pulmonary Disease

## 2022-07-24 NOTE — Patient Outreach (Signed)
  Care Coordination   Follow Up Visit Note   07/24/2022 Name: BRITIAN JENTZ MRN: [id number] DOB: Oct 13, 1950  ZYSHONNE MALECHA is a 71 y.o. year old male who sees [name], MD for primary care. I  spoke with patients spouse and caregiver Rigel Filsinger  What matters to the patients health and wellness today?  Transportation Resources    Goals Addressed             This Visit's Progress    Care Coordination Activities       Care Coordination Interventions: Discussed the patient needs assistance with transportation to medical appointments. The patient currently uses [place] but rides are limited. Patients spouse also assists with transportation but it is difficult to safely transfer the patient into a vehicle Education provided on [place] for transportation within the city limits as well as [employer]) for appointments to see patients neurologist in [place] the patients spouse is interested in both programs and consents to SW completing application on patients behalf TAMS application completed and submitted SCAT Part A completed. Sent PART B to RN Care Manager to sign off on. SW will submit application once received Mailed the patient and his spouse information on both programs for review         SDOH assessments and interventions completed:  No     Care Coordination Interventions Activated:  Yes  Care Coordination Interventions:  Yes, provided   Follow up plan:  SW will continue to follow to submit SCAT application once received    Encounter Outcome:  Pt. Visit Completed   Daneen Schick, Arita Miss, CDP Social Worker, Certified Dementia Practitioner [place] Coordination [phone]

## 2022-07-24 NOTE — Patient Instructions (Signed)
Visit Information  Thank you for taking time to visit with me today. Please don't hesitate to contact me if I can be of assistance to you.   Following are the goals we discussed today:   Goals Addressed             This Visit's Progress    Care Coordination Activities       Care Coordination Interventions: Discussed the patient needs assistance with transportation to medical appointments. The patient currently uses [place] but rides are limited. Patients spouse also assists with transportation but it is difficult to safely transfer the patient into a vehicle Education provided on [place] for transportation within the city limits as well as [employer]) for appointments to see patients neurologist in [place] the patients spouse is interested in both programs and consents to SW completing application on patients behalf TAMS application completed and submitted SCAT Part A completed. Sent PART B to RN Care Manager to sign off on. SW will submit application once received Mailed the patient and his spouse information on both programs for review         If you are experiencing a Mental Health or [place] or need someone to talk to, please call 1-800-273-TALK (toll free, 24 hour hotline)  Patient verbalizes understanding of instructions and care plan provided today and agrees to view in [place]. Active MyChart status and patient understanding of how to access instructions and care plan via MyChart confirmed with patient.     Daneen Schick, BSW, CDP Social Worker, Certified Dementia Practitioner [place] [facility]  Care Coordination [phone]

## 2022-07-25 DIAGNOSIS — D4101 Neoplasm of uncertain behavior of right kidney: Secondary | ICD-10-CM | POA: Diagnosis not present

## 2022-07-25 DIAGNOSIS — R338 Other retention of urine: Secondary | ICD-10-CM | POA: Diagnosis not present

## 2022-07-25 DIAGNOSIS — D4102 Neoplasm of uncertain behavior of left kidney: Secondary | ICD-10-CM | POA: Diagnosis not present

## 2022-07-31 ENCOUNTER — Ambulatory Visit (HOSPITAL_BASED_OUTPATIENT_CLINIC_OR_DEPARTMENT_OTHER): Payer: Medicare HMO | Admitting: Family Medicine

## 2022-07-31 ENCOUNTER — Ambulatory Visit: Payer: Self-pay

## 2022-07-31 ENCOUNTER — Encounter: Payer: Self-pay | Admitting: Family Medicine

## 2022-07-31 DIAGNOSIS — G1223 Primary lateral sclerosis: Secondary | ICD-10-CM

## 2022-07-31 DIAGNOSIS — N2889 Other specified disorders of kidney and ureter: Secondary | ICD-10-CM

## 2022-07-31 DIAGNOSIS — R059 Cough, unspecified: Secondary | ICD-10-CM

## 2022-07-31 DIAGNOSIS — J159 Unspecified bacterial pneumonia: Secondary | ICD-10-CM | POA: Diagnosis not present

## 2022-07-31 NOTE — Patient Outreach (Signed)
  Care Coordination   Collaboration  Visit Note   07/31/2022 Name: Darrell [name] MRN: [id number] DOB: 24-Oct-1950  Darrell [name] is a 71 y.o. year old male who sees [name], MD for primary care. I  submitted a SCAT application on the patients behalf.  What matters to the patients health and wellness today?  To obtain transportation resources    Goals Addressed             This Visit's Progress    Care Coordination Activities       Care Coordination Interventions: SCAT application submitted on behalf of the patient SW will follow up with the patients spouse as scheduled on 11/30          SDOH assessments and interventions completed:  No     Care Coordination Interventions Activated:  Yes  Care Coordination Interventions:  Yes, provided   Follow up plan:  SW will outreach the patient as planned on 11/30    Encounter Outcome:  Pt. Visit Completed   Daneen Schick, Arita Miss, CDP Social Worker, Certified Dementia Practitioner [place] [facility]  Care Coordination [phone]

## 2022-07-31 NOTE — Progress Notes (Signed)
Virtual Visit via Telephone Note  I connected with Darrell [name], on 07/31/2022 at 8:15 AM by telephone and verified that I am speaking with the correct person using two identifiers.   Consent: I discussed the limitations, risks, security and privacy concerns of performing an evaluation and management service by telephone and the availability of in person appointments. I also discussed with the patient that there may be a patient responsible charge related to this service. The patient expressed understanding and agreed to proceed.   Location of Patient: Home  Location of Provider: Clinic   Persons participating in Telemedicine visit: Darrell [name] Dr. Margarita Rana     History of Present Illness: Darrell [name] is a 71 y.o. year old male with a history of hypertension, previous CVA with residual left-sided weakness, seizure in the setting of prior CVA, second degree AV block (status post medtronic dual chamber pacemaker) stage III CKD, motor neuron disease/ALS, urinary retention.   Hospitalized for CAP and acute cystitis last month and has completed his antibiotics.  Spouse states Tmax has been 98.1 and he still has a cough. He does Mucinex and is also on Robinul. Spouse states Mucinex does not help with his cough which is still somewhat productive. Endorses adherence with his inhalers. He has pulmonary appointment comes up tomorrow.  Denies presence of hematuria and has a Foley in place which is changed by [facility] regularly. He had a visit with Urology (Dr Tresa Moore)  last week.  Since Darrell [name] has difficulty communicating at this time for her to know when he has symptoms of UTI. Past Medical History:  Diagnosis Date   Arthritis    Asthma    At high risk for falls 08/16/2015   CIDP (chronic inflammatory demyelinating polyneuropathy) ([name])    CKD (chronic kidney disease) stage 3, GFR 30-59 ml/min ([place]) 08/16/2015   Dysphagia as late effect of cerebrovascular disease    pts wife states  pt has to eat soft foods    Elevated liver enzymes 08/10/2016   GERD (gastroesophageal reflux disease)    Glaucoma    High cholesterol    History of CVA with residual deficit 03/25/2013   Hypertension    Hypertensive retinopathy of both eyes 01/16/2017   Inguinal hernia 03/25/2013   Liver hemangioma 08/14/2016   New onset seizure ([place]) 07/08/2017   seizure 07/14/18   Nuclear sclerosis of both eyes 10/25/2016   Pneumonia    Presence of permanent cardiac pacemaker    Primary open angle glaucoma of both eyes, indeterminate stage 10/25/2016   Renal mass, right 08/14/2016   Status cardiac pacemaker 01/29/2017   Placed for second degree heart block on 01/16/17 Medtronic Azure XT DR MRI SureScan dual-chamber pacemaker   Stroke [facility])    2011 with residual deficit left sided weakness   Tobacco dependence    Allergies  Allergen Reactions   Ace Inhibitors Other (See Comments)    Hyperkalemia   Doxycycline Other (See Comments)    Hiccups, cough, nausea and emesis, elevated liver enzymes, elevated eosinophils, SOB concerning for early DRESS syndrome    Atacand Hct [Candesartan Cilexetil-Hctz] Hives   Shellfish Allergy Hives    Current Outpatient Medications on File Prior to Visit  Medication Sig Dispense Refill   acetaminophen (TYLENOL) 325 MG tablet Take 325 mg by mouth daily as needed for mild pain or moderate pain.     albuterol (VENTOLIN HFA) 108 (90 Base) MCG/ACT inhaler Inhale 2 puffs into the lungs every 6 (six) hours  as needed for up to 30 days for Wheezing. 18 g 5   amLODipine (NORVASC) 10 MG tablet Take 1 tablet (10 mg total) by mouth daily. 90 tablet 1   amoxicillin (AMOXIL) 400 MG/5ML suspension Take 10.9 mLs (875 mg total) by mouth 2 (two) times daily. 100 mL 0   apixaban (ELIQUIS) 5 MG TABS tablet Take 1 tablet (5 mg total) by mouth 2 (two) times daily. Upon completion of starter pack 60 tablet 1   atorvastatin (LIPITOR) 40 MG tablet Take 1 tablet (40 mg total) by mouth daily.  90 tablet 1   brimonidine (ALPHAGAN) 0.2 % ophthalmic solution Place 1 drop into both eyes daily.     cetirizine (ZYRTEC) 10 MG tablet Take 10 mg by mouth daily as needed for allergies.     dicyclomine (BENTYL) 10 MG capsule Take 10 mg by mouth daily as needed for spasms (pain).     dorzolamide-timolol (COSOPT) 22.3-6.8 MG/ML ophthalmic solution Place 1 drop into both eyes daily.     ferrous sulfate (FEROSUL) 325 (65 FE) MG tablet Take 1 tablet (325 mg total) by mouth 2 (two) times daily with a meal. 60 tablet 2   finasteride (PROSCAR) 5 MG tablet Take 5 mg by mouth daily.     fluticasone (FLONASE) 50 MCG/ACT nasal spray Place 1 spray into both nostrils daily as needed for allergies.     fluticasone furoate-vilanterol (BREO ELLIPTA) 200-25 MCG/ACT AEPB Inhale 2 puffs into the lungs daily as needed (for shortness of breath).     glycopyrrolate (ROBINUL) 1 MG tablet Take 1 tablet (1 mg total) by mouth 2 times daily. 60 tablet 5   guaiFENesin (MUCINEX) 600 MG 12 hr tablet Take 600 mg by mouth daily.     hydrALAZINE (APRESOLINE) 25 MG tablet Take 1 tablet (25 mg total) by mouth 3 (three) times daily. 180 tablet 3   hydrocortisone (ANUSOL-HC) 2.5 % rectal cream Place 1 Application rectally 2 (two) times daily. 30 g 0   latanoprost (XALATAN) 0.005 % ophthalmic solution Place 1 drop into both eyes at bedtime.     levETIRAcetam (KEPPRA) 500 MG tablet Take 1 tablet (500 mg total) by mouth 2 (two) times daily. 180 tablet 4   metoprolol succinate (TOPROL-XL) 50 MG 24 hr tablet Take 1 tablet (50 mg total) by mouth at bedtime. Take with or immediately following a meal. 30 tablet 3   montelukast (SINGULAIR) 10 MG tablet Take 1 tablet by mouth at bedtime.     Multiple Vitamin (MULTI-VITAMIN) tablet Take 1 tablet by mouth daily.     ondansetron (ZOFRAN) 4 MG tablet Take 1 tablet (4 mg total) by mouth every 8 (eight) hours as needed for nausea or vomiting. 20 tablet 0   tamsulosin (FLOMAX) 0.4 MG CAPS capsule Take  1 capsule (0.4 mg total) by mouth daily. 90 capsule 1   torsemide (DEMADEX) 20 MG tablet Take 20 mg by mouth daily as needed (fluid).     No current facility-administered medications on file prior to visit.    ROS: See HPI  Observations/Objective: Awake, alert, oriented x3 Not in acute distress Normal mood      Latest Ref Rng & Units 07/09/2022    3:56 AM 07/08/2022    2:37 AM 07/07/2022    4:16 PM  CMP  Glucose 70 - 99 mg/dL 94  93    BUN 8 - 23 mg/dL 41  44    Creatinine 0.61 - 1.24 mg/dL 2.46  2.48  Sodium 135 - 145 mmol/L 143  137    Potassium 3.5 - 5.1 mmol/L 4.2  4.9  5.3   Chloride 98 - 111 mmol/L 123  117    CO2 22 - 32 mmol/L 16  16    Calcium 8.9 - 10.3 mg/dL 8.8  8.8      Lipid Panel     Component Value Date/Time   CHOL 112 06/13/2018 0954   TRIG 51 06/13/2018 0954   HDL 40 06/13/2018 0954   CHOLHDL 2.8 06/13/2018 0954   CHOLHDL 4.1 08/16/2015 1555   VLDL 23 08/16/2015 1555   LDLCALC 62 06/13/2018 0954   LABVLDL 10 06/13/2018 0954    Lab Results  Component Value Date   HGBA1C 5.1 08/20/2019    Assessment and Plan: 1. Other specified disorders of kidney and ureter He does have chronic urinary retention with a Foley in place Per spouse, the patient and his urologist have agreed to conservative management. He also has renal mass but due to his morbidity invasive procedures were not pursued Due to indwelling Foley catheter he is at risk of recurrent UTIs I have advised the spouse to speak with his urologist regarding symptoms to be on the look out for to prevent UTI Frequent Foley changing is necessary -done by [facility] RN  2. Cough in adult patient Secondary to his PLS He is currently on Mucinex and Robinul  Was previously on antibiotics in the past by his pulmonologist He has an upcoming appointment with pulmonary tomorrow and has been advised to discuss this  3. Community acquired bacterial pneumonia Completed course of antibiotics  4. 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 The patient was provided an opportunity to ask questions and all were answered. The patient agreed with the plan and demonstrated an understanding of the instructions.   The patient was advised to call back or seek an in-person evaluation if the symptoms worsen or if the condition fails to improve as anticipated.     I provided 16 minutes total of non-face-to-face time during this encounter.   [name], MD, FAAFP. [facility] and [place], [place]   07/31/2022, 8:15 AM

## 2022-08-01 ENCOUNTER — Ambulatory Visit: Payer: Medicare HMO | Admitting: Pulmonary Disease

## 2022-08-01 ENCOUNTER — Encounter: Payer: Self-pay | Admitting: Pulmonary Disease

## 2022-08-01 ENCOUNTER — Other Ambulatory Visit: Payer: Self-pay | Admitting: Pulmonary Disease

## 2022-08-01 VITALS — BP 140/78 | HR 60 | Temp 97.4°F | Ht 75.5 in | Wt 172.0 lb

## 2022-08-01 DIAGNOSIS — J432 Centrilobular emphysema: Secondary | ICD-10-CM

## 2022-08-01 DIAGNOSIS — G1221 Amyotrophic lateral sclerosis: Secondary | ICD-10-CM

## 2022-08-01 MED ORDER — AZITHROMYCIN 250 MG PO TABS: 250.0000 mg | ORAL_TABLET | ORAL | 6 refills | Status: DC

## 2022-08-01 NOTE — Progress Notes (Signed)
Darrell [name]    [id number]    24-Jan-1951  Primary Care Physician:Newlin, Charlane Ferretti, MD  Referring Physician: [name], MD [place],  [place] [address]  Chief complaint:   Patient being seen for obstructive lung disease  HPI:  Patient being seen for obstructive lung disease He has a chronic cough -Cough has been relatively stable An active smoker, smoked heavily in the past Has a history of ALS History of previous strokes  He is wheelchair dependent Has not walked in many years patient had a stroke in 2011 left him with left-sided weakness Progressive neurological deficits  Aspiration risk is high, he does have his food pured, used to use thickeners No recent significant choking episodes Has a history of seizures  Almost 80-pack-year smoking history  Was recently following up with Dr. Dan Europe  Worked as a truck driver  He is legally blind  Outpatient Encounter Medications as of 08/01/2022  Medication Sig   acetaminophen (TYLENOL) 325 MG tablet Take 325 mg by mouth daily as needed for mild pain or moderate pain.   albuterol (VENTOLIN HFA) 108 (90 Base) MCG/ACT inhaler Inhale 2 puffs into the lungs every 6 (six) hours as needed for up to 30 days for Wheezing.   amLODipine (NORVASC) 10 MG tablet Take 1 tablet (10 mg total) by mouth daily.   apixaban (ELIQUIS) 5 MG TABS tablet Take 1 tablet (5 mg total) by mouth 2 (two) times daily. Upon completion of starter pack   atorvastatin (LIPITOR) 40 MG tablet Take 1 tablet (40 mg total) by mouth daily.   brimonidine (ALPHAGAN) 0.2 % ophthalmic solution Place 1 drop into both eyes daily.   cetirizine (ZYRTEC) 10 MG tablet Take 10 mg by mouth daily as needed for allergies.   dicyclomine (BENTYL) 10 MG capsule Take 10 mg by mouth daily as needed for spasms (pain).   dorzolamide-timolol (COSOPT) 22.3-6.8 MG/ML ophthalmic solution Place 1 drop into both eyes daily.   ferrous sulfate (FEROSUL)  325 (65 FE) MG tablet Take 1 tablet (325 mg total) by mouth 2 (two) times daily with a meal.   finasteride (PROSCAR) 5 MG tablet Take 5 mg by mouth daily.   fluticasone (FLONASE) 50 MCG/ACT nasal spray Place 1 spray into both nostrils daily as needed for allergies.   fluticasone furoate-vilanterol (BREO ELLIPTA) 200-25 MCG/ACT AEPB Inhale 2 puffs into the lungs daily as needed (for shortness of breath).   glycopyrrolate (ROBINUL) 1 MG tablet Take 1 tablet (1 mg total) by mouth 2 times daily.   guaiFENesin (MUCINEX) 600 MG 12 hr tablet Take 600 mg by mouth daily.   hydrALAZINE (APRESOLINE) 25 MG tablet Take 1 tablet (25 mg total) by mouth 3 (three) times daily.   hydrocortisone (ANUSOL-HC) 2.5 % rectal cream Place 1 Application rectally 2 (two) times daily.   latanoprost (XALATAN) 0.005 % ophthalmic solution Place 1 drop into both eyes at bedtime.   metoprolol succinate (TOPROL-XL) 50 MG 24 hr tablet Take 1 tablet (50 mg total) by mouth at bedtime. Take with or immediately following a meal.   montelukast (SINGULAIR) 10 MG tablet Take 1 tablet by mouth at bedtime.   Multiple Vitamin (MULTI-VITAMIN) tablet Take 1 tablet by mouth daily.   ondansetron (ZOFRAN) 4 MG tablet Take 1 tablet (4 mg total) by mouth every 8 (eight) hours as needed for nausea or vomiting.   tamsulosin (FLOMAX) 0.4 MG  CAPS capsule Take 1 capsule (0.4 mg total) by mouth daily.   torsemide (DEMADEX) 20 MG tablet Take 20 mg by mouth daily as needed (fluid).   levETIRAcetam (KEPPRA) 500 MG tablet Take 1 tablet (500 mg total) by mouth 2 (two) times daily. (Patient not taking: Reported on 08/01/2022)   nystatin (MYCOSTATIN/NYSTOP) powder SMARTSIG:Packet(s) Topical Twice Daily (Patient not taking: Reported on 08/01/2022)   No facility-administered encounter medications on file as of 08/01/2022.    Allergies as of 08/01/2022 - Review Complete 08/01/2022  Allergen Reaction Noted   Ace inhibitors Other (See Comments) 11/26/2015    Doxycycline Other (See Comments) 08/14/2016   Atacand hct [candesartan cilexetil-hctz] Hives 02/25/2012   Shellfish allergy Hives 02/25/2012    Past Medical History:  Diagnosis Date   Arthritis    Asthma    At high risk for falls 08/16/2015   CIDP (chronic inflammatory demyelinating polyneuropathy) ([name])    CKD (chronic kidney disease) stage 3, GFR 30-59 ml/min ([facility] [facility]) 08/16/2015   Dysphagia as late effect of cerebrovascular disease    pts wife states pt has to eat soft foods    Elevated liver enzymes 08/10/2016   GERD (gastroesophageal reflux disease)    Glaucoma    High cholesterol    History of CVA with residual deficit 03/25/2013   Hypertension    Hypertensive retinopathy of both eyes 01/16/2017   Inguinal hernia 03/25/2013   Liver hemangioma 08/14/2016   New onset seizure ([facility]) 07/08/2017   seizure 07/14/18   Nuclear sclerosis of both eyes 10/25/2016   Pneumonia    Presence of permanent cardiac pacemaker    Primary open angle glaucoma of both eyes, indeterminate stage 10/25/2016   Renal mass, right 08/14/2016   Status cardiac pacemaker 01/29/2017   Placed for second degree heart block on 01/16/17 Medtronic Azure XT DR MRI SureScan dual-chamber pacemaker   Stroke [facility])    2011 with residual deficit left sided weakness   Tobacco dependence     Past Surgical History:  Procedure Laterality Date   EYE SURGERY     HERNIA REPAIR     INGUINAL HERNIA REPAIR Right 11/23/2014   Procedure: right inguinal hernia repair with mesh;  Surgeon: Armandina Gemma, MD;  Location: [facility];  Service: General;  Laterality: Right;   INSERTION OF MESH N/A 11/23/2014   Procedure: INSERTION OF MESH;  Surgeon: Armandina Gemma, MD;  Location: [facility];  Service: General;  Laterality: N/A;   JOINT REPLACEMENT     MASS EXCISION Left 08/29/2017   Procedure: EXCISION OF LEFT NECK MASS;  Surgeon: Coralie Keens, MD;  Location: [facility];  Service: General;  Laterality: Left;   PACEMAKER IMPLANT N/A 01/16/2017    Procedure: Pacemaker Implant;  Surgeon: Will Meredith Leeds, MD;  Location: [facility];  Service: Cardiovascular;  Laterality: N/A;   SHOULDER SURGERY Bilateral 1988, 1998    Family History  Problem Relation Age of Onset   Hypertension Mother    Diabetes Sister    Hypertension Sister    Cancer Sister        1 sister   COPD Sister        in 1 sister   Stomach cancer Neg Hx    Colon cancer Neg Hx    Pancreatic cancer Neg Hx    Esophageal cancer Neg Hx     Social History   Socioeconomic History   Marital status: Married    Spouse name: [name]   Number of children: 1   Years  of education: college   Highest education level: Not on file  Occupational History   Occupation: retired  Tobacco Use   Smoking status: Some Days    Packs/day: 0.25    Years: 40.00    Total pack years: 10.00    Types: Cigarettes   Smokeless tobacco: Never   Tobacco comments:    About 2-3 cigarettes every other day.  Vaping Use   Vaping Use: Never used  Substance and Sexual Activity   Alcohol use: No    Comment: alcohol free for 1 year was drinking 1/2 pint per day    Drug use: No   Sexual activity: Not on file  Other Topics Concern   Not on file  Social History Narrative   09/13/17 Patient lives at home with spouse.   Has 34 yo daughter   No grandchildren   Social Determinants of Radio broadcast assistant Strain: Not on file  Food Insecurity: No Food Insecurity (07/10/2022)   Hunger Vital Sign    Worried About Running Out of Food in the Last Year: Never true    Ran Out of Food in the Last Year: Never true  Transportation Needs: No Transportation Needs (07/20/2022)   PRAPARE - Hydrologist (Medical): No    Lack of Transportation (Non-Medical): No  Physical Activity: Not on file  Stress: Not on file  Social Connections: Not on file  Intimate Partner Violence: Not At Risk (07/07/2022)   Humiliation, Afraid, Rape, and Kick questionnaire    Fear of  Current or Ex-Partner: No    Emotionally Abused: No    Physically Abused: No    Sexually Abused: No    Review of Systems  Respiratory:  Positive for cough.   Neurological:  Positive for speech difficulty.    Vitals:   08/01/22 1415  BP: (!) 140/78  Pulse: 60  Temp: (!) 97.4 F (36.3 C)  SpO2: 98%     Physical Exam Constitutional:      Appearance: Normal appearance.  HENT:     Head: Normocephalic.     Mouth/Throat:     Mouth: Mucous membranes are moist.  Cardiovascular:     Rate and Rhythm: Normal rate.     Heart sounds: No murmur heard.    No friction rub.  Pulmonary:     Effort: No respiratory distress.     Breath sounds: No stridor. No wheezing or rhonchi.  Musculoskeletal:     Cervical back: No rigidity or tenderness.  Neurological:     Mental Status: He is alert.     Comments: Wheelchair borne  Psychiatric:        Mood and Affect: Mood normal.      Data Reviewed: Records reviewed from care everywhere CT scan of the chest from 08/04/2021 shows some evidence of emphysema  Assessment:  History of chronic obstructive pulmonary disease  History of ALS, neurodegenerative disease  History of prior CVA  Active smoker  History of chronic bronchitis/emphysema  Plan/Recommendations: Prescription for azithromycin was renewed-250 mg 3 days a week 3  Continue to use CoughAssist device for mucus clearance  Continue Breztri  He does continue to smoke for discomfort  Aspiration precautions discussed  He has poor general functional status , seems well otherwise  Encouraged to call with any significant concerns   Sherrilyn Rist MD  Pulmonary and Critical Care 08/01/2022, 2:43 PM  CC: [name], MD

## 2022-08-01 NOTE — Patient Instructions (Signed)
Continue inhalers  Prescription for azithromycin will be sent to pharmacy  Continue other lines of care  Follow-up in 6 months

## 2022-08-03 ENCOUNTER — Ambulatory Visit: Payer: Self-pay

## 2022-08-03 DIAGNOSIS — G1221 Amyotrophic lateral sclerosis: Secondary | ICD-10-CM | POA: Diagnosis not present

## 2022-08-03 NOTE — Patient Outreach (Signed)
  Care Coordination   Follow Up Visit Note   08/03/2022 Name: RANE BLITCH MRN: [id number] DOB: [birth year]  EVIAN SALGUERO is a 71 y.o. year old male who sees [name], MD for primary care. I sent a secure email to Reche Dixon RN with [facility] today.   What matters to the patients health and wellness today?  Patient would like to transition to [place] for transition of primary care.     Goals Addressed             This Visit's Progress    I want to keep him healthy       Care Coordination Interventions: Sent secure email to Reche Dixon RN with [facility] requesting she reach out to patient and wife [name] to assess for transition of primary care per patient/wife's request during previous RN CC outreach          SDOH assessments and interventions completed:  No     Care Coordination Interventions Activated:  Yes  Care Coordination Interventions:  Yes, provided   Follow up plan: Referral made to [place]  Follow up call scheduled for 08/16/22 @ 12:30 PM    Encounter Outcome:  Pt. Visit Completed

## 2022-08-04 DIAGNOSIS — N3000 Acute cystitis without hematuria: Secondary | ICD-10-CM | POA: Diagnosis not present

## 2022-08-04 DIAGNOSIS — R338 Other retention of urine: Secondary | ICD-10-CM | POA: Diagnosis not present

## 2022-08-11 ENCOUNTER — Emergency Department (HOSPITAL_COMMUNITY)
Admit: 2022-08-11 | Discharge: 2022-08-11 | Disposition: A | Discharge status: Home / Self Care | Payer: Medicare HMO | Attending: Emergency Medicine | Admitting: Emergency Medicine

## 2022-08-11 ENCOUNTER — Emergency Department (HOSPITAL_COMMUNITY): Payer: Medicare HMO

## 2022-08-11 ENCOUNTER — Inpatient Hospital Stay (HOSPITAL_COMMUNITY)
Admission: EM | Admit: 2022-08-11 | Discharge: 2022-08-13 | DRG: 177 | Disposition: A | Discharge status: Home Health | Payer: Medicare HMO | Attending: Internal Medicine | Admitting: Internal Medicine

## 2022-08-11 ENCOUNTER — Ambulatory Visit: Payer: Self-pay | Admitting: *Deleted

## 2022-08-11 DIAGNOSIS — J168 Pneumonia due to other specified infectious organisms: Secondary | ICD-10-CM | POA: Diagnosis not present

## 2022-08-11 DIAGNOSIS — Z743 Need for continuous supervision: Secondary | ICD-10-CM | POA: Diagnosis not present

## 2022-08-11 DIAGNOSIS — J4489 Other specified chronic obstructive pulmonary disease: Secondary | ICD-10-CM | POA: Diagnosis not present

## 2022-08-11 DIAGNOSIS — Z833 Family history of diabetes mellitus: Secondary | ICD-10-CM

## 2022-08-11 DIAGNOSIS — G1221 Amyotrophic lateral sclerosis: Secondary | ICD-10-CM | POA: Diagnosis not present

## 2022-08-11 DIAGNOSIS — I69354 Hemiplegia and hemiparesis following cerebral infarction affecting left non-dominant side: Secondary | ICD-10-CM

## 2022-08-11 DIAGNOSIS — R509 Fever, unspecified: Secondary | ICD-10-CM | POA: Diagnosis not present

## 2022-08-11 DIAGNOSIS — I10 Essential (primary) hypertension: Secondary | ICD-10-CM | POA: Diagnosis not present

## 2022-08-11 DIAGNOSIS — E7800 Pure hypercholesterolemia, unspecified: Secondary | ICD-10-CM | POA: Diagnosis present

## 2022-08-11 DIAGNOSIS — R197 Diarrhea, unspecified: Secondary | ICD-10-CM | POA: Diagnosis not present

## 2022-08-11 DIAGNOSIS — L89152 Pressure ulcer of sacral region, stage 2: Secondary | ICD-10-CM | POA: Diagnosis present

## 2022-08-11 DIAGNOSIS — H35033 Hypertensive retinopathy, bilateral: Secondary | ICD-10-CM | POA: Diagnosis not present

## 2022-08-11 DIAGNOSIS — N184 Chronic kidney disease, stage 4 (severe): Secondary | ICD-10-CM | POA: Diagnosis not present

## 2022-08-11 DIAGNOSIS — Z66 Do not resuscitate: Secondary | ICD-10-CM | POA: Diagnosis not present

## 2022-08-11 DIAGNOSIS — Z9181 History of falling: Secondary | ICD-10-CM

## 2022-08-11 DIAGNOSIS — G9341 Metabolic encephalopathy: Secondary | ICD-10-CM | POA: Diagnosis not present

## 2022-08-11 DIAGNOSIS — I82501 Chronic embolism and thrombosis of unspecified deep veins of right lower extremity: Secondary | ICD-10-CM | POA: Diagnosis present

## 2022-08-11 DIAGNOSIS — N400 Enlarged prostate without lower urinary tract symptoms: Secondary | ICD-10-CM | POA: Diagnosis not present

## 2022-08-11 DIAGNOSIS — Z950 Presence of cardiac pacemaker: Secondary | ICD-10-CM

## 2022-08-11 DIAGNOSIS — N179 Acute kidney failure, unspecified: Secondary | ICD-10-CM | POA: Diagnosis present

## 2022-08-11 DIAGNOSIS — F17210 Nicotine dependence, cigarettes, uncomplicated: Secondary | ICD-10-CM | POA: Diagnosis present

## 2022-08-11 DIAGNOSIS — I6930 Unspecified sequelae of cerebral infarction: Secondary | ICD-10-CM | POA: Diagnosis not present

## 2022-08-11 DIAGNOSIS — T68XXXA Hypothermia, initial encounter: Secondary | ICD-10-CM | POA: Diagnosis not present

## 2022-08-11 DIAGNOSIS — H2513 Age-related nuclear cataract, bilateral: Secondary | ICD-10-CM | POA: Diagnosis present

## 2022-08-11 DIAGNOSIS — R0602 Shortness of breath: Secondary | ICD-10-CM | POA: Diagnosis not present

## 2022-08-11 DIAGNOSIS — G40909 Epilepsy, unspecified, not intractable, without status epilepticus: Secondary | ICD-10-CM

## 2022-08-11 DIAGNOSIS — Z720 Tobacco use: Secondary | ICD-10-CM | POA: Diagnosis present

## 2022-08-11 DIAGNOSIS — Z8249 Family history of ischemic heart disease and other diseases of the circulatory system: Secondary | ICD-10-CM | POA: Diagnosis not present

## 2022-08-11 DIAGNOSIS — D638 Anemia in other chronic diseases classified elsewhere: Secondary | ICD-10-CM | POA: Diagnosis not present

## 2022-08-11 DIAGNOSIS — Z8679 Personal history of other diseases of the circulatory system: Secondary | ICD-10-CM

## 2022-08-11 DIAGNOSIS — N189 Chronic kidney disease, unspecified: Secondary | ICD-10-CM

## 2022-08-11 DIAGNOSIS — I129 Hypertensive chronic kidney disease with stage 1 through stage 4 chronic kidney disease, or unspecified chronic kidney disease: Secondary | ICD-10-CM | POA: Diagnosis present

## 2022-08-11 DIAGNOSIS — R1310 Dysphagia, unspecified: Secondary | ICD-10-CM | POA: Diagnosis not present

## 2022-08-11 DIAGNOSIS — Z825 Family history of asthma and other chronic lower respiratory diseases: Secondary | ICD-10-CM | POA: Diagnosis not present

## 2022-08-11 DIAGNOSIS — Z978 Presence of other specified devices: Secondary | ICD-10-CM

## 2022-08-11 DIAGNOSIS — K219 Gastro-esophageal reflux disease without esophagitis: Secondary | ICD-10-CM | POA: Diagnosis present

## 2022-08-11 DIAGNOSIS — Z888 Allergy status to other drugs, medicaments and biological substances status: Secondary | ICD-10-CM

## 2022-08-11 DIAGNOSIS — J690 Pneumonitis due to inhalation of food and vomit: Principal | ICD-10-CM | POA: Diagnosis present

## 2022-08-11 DIAGNOSIS — J189 Pneumonia, unspecified organism: Secondary | ICD-10-CM

## 2022-08-11 DIAGNOSIS — I82509 Chronic embolism and thrombosis of unspecified deep veins of unspecified lower extremity: Secondary | ICD-10-CM | POA: Diagnosis present

## 2022-08-11 DIAGNOSIS — Z7901 Long term (current) use of anticoagulants: Secondary | ICD-10-CM

## 2022-08-11 DIAGNOSIS — J9811 Atelectasis: Secondary | ICD-10-CM | POA: Diagnosis not present

## 2022-08-11 DIAGNOSIS — D631 Anemia in chronic kidney disease: Secondary | ICD-10-CM | POA: Diagnosis present

## 2022-08-11 DIAGNOSIS — R609 Edema, unspecified: Secondary | ICD-10-CM | POA: Diagnosis not present

## 2022-08-11 DIAGNOSIS — R059 Cough, unspecified: Secondary | ICD-10-CM | POA: Diagnosis not present

## 2022-08-11 DIAGNOSIS — I69391 Dysphagia following cerebral infarction: Secondary | ICD-10-CM | POA: Diagnosis not present

## 2022-08-11 DIAGNOSIS — Z91013 Allergy to seafood: Secondary | ICD-10-CM

## 2022-08-11 DIAGNOSIS — M7989 Other specified soft tissue disorders: Secondary | ICD-10-CM

## 2022-08-11 DIAGNOSIS — H409 Unspecified glaucoma: Secondary | ICD-10-CM | POA: Diagnosis present

## 2022-08-11 DIAGNOSIS — Z20822 Contact with and (suspected) exposure to covid-19: Secondary | ICD-10-CM | POA: Diagnosis present

## 2022-08-11 DIAGNOSIS — Z79899 Other long term (current) drug therapy: Secondary | ICD-10-CM

## 2022-08-11 DIAGNOSIS — I959 Hypotension, unspecified: Secondary | ICD-10-CM | POA: Diagnosis not present

## 2022-08-11 DIAGNOSIS — R918 Other nonspecific abnormal finding of lung field: Secondary | ICD-10-CM | POA: Diagnosis not present

## 2022-08-11 LAB — URINALYSIS, ROUTINE W REFLEX MICROSCOPIC
Bilirubin Urine: NEGATIVE
Glucose, UA: NEGATIVE mg/dL
Hgb urine dipstick: NEGATIVE
Ketones, ur: NEGATIVE mg/dL
Leukocytes,Ua: NEGATIVE
Nitrite: NEGATIVE
Protein, ur: NEGATIVE mg/dL
Specific Gravity, Urine: 1 — ABNORMAL LOW (ref 1.005–1.030)
pH: 6 (ref 5.0–8.0)

## 2022-08-11 LAB — CBC WITH DIFFERENTIAL/PLATELET
Abs Immature Granulocytes: 0.02 10*3/uL (ref 0.00–0.07)
Basophils Absolute: 0 10*3/uL (ref 0.0–0.1)
Basophils Relative: 1 %
Eosinophils Absolute: 0.5 10*3/uL (ref 0.0–0.5)
Eosinophils Relative: 7 %
HCT: 33 % — ABNORMAL LOW (ref 39.0–52.0)
Hemoglobin: 10.4 g/dL — ABNORMAL LOW (ref 13.0–17.0)
Immature Granulocytes: 0 %
Lymphocytes Relative: 29 %
Lymphs Abs: 2 10*3/uL (ref 0.7–4.0)
MCH: 29 pg (ref 26.0–34.0)
MCHC: 31.5 g/dL (ref 30.0–36.0)
MCV: 91.9 fL (ref 80.0–100.0)
Monocytes Absolute: 0.8 10*3/uL (ref 0.1–1.0)
Monocytes Relative: 11 %
Neutro Abs: 3.6 10*3/uL (ref 1.7–7.7)
Neutrophils Relative %: 52 %
Platelets: 200 10*3/uL (ref 150–400)
RBC: 3.59 MIL/uL — ABNORMAL LOW (ref 4.22–5.81)
RDW: 15.2 % (ref 11.5–15.5)
WBC: 6.9 10*3/uL (ref 4.0–10.5)
nRBC: 0 % (ref 0.0–0.2)

## 2022-08-11 LAB — BASIC METABOLIC PANEL
Anion gap: 5 (ref 5–15)
BUN: 57 mg/dL — ABNORMAL HIGH (ref 8–23)
CO2: 21 mmol/L — ABNORMAL LOW (ref 22–32)
Calcium: 9.4 mg/dL (ref 8.9–10.3)
Chloride: 115 mmol/L — ABNORMAL HIGH (ref 98–111)
Creatinine, Ser: 2.89 mg/dL — ABNORMAL HIGH (ref 0.61–1.24)
GFR, Estimated: 23 mL/min — ABNORMAL LOW (ref >60)
Glucose, Bld: 100 mg/dL — ABNORMAL HIGH (ref 70–99)
Potassium: 4.8 mmol/L (ref 3.5–5.1)
Sodium: 141 mmol/L (ref 135–145)

## 2022-08-11 LAB — RESP PANEL BY RT-PCR (FLU A&B, COVID) ARPGX2
Influenza A by PCR: NEGATIVE
Influenza B by PCR: NEGATIVE
SARS Coronavirus 2 by RT PCR: NEGATIVE

## 2022-08-11 LAB — PROCALCITONIN: Procalcitonin: <0.1 ng/mL

## 2022-08-11 MED ORDER — SODIUM CHLORIDE 0.9 % IV SOLN: INTRAVENOUS | Status: AC

## 2022-08-11 MED ORDER — RISAQUAD PO CAPS
1.0000 | ORAL_CAPSULE | Freq: Every day | ORAL | Status: DC
  Administered 2022-08-11 – 2022-08-13 (×3): 1 via ORAL
  Filled 2022-08-11 (×4): qty 1

## 2022-08-11 MED ORDER — SODIUM CHLORIDE 0.9 % IV SOLN
500.0000 mg | Freq: Once | INTRAVENOUS | Status: DC
  Filled 2022-08-11: qty 5

## 2022-08-11 MED ORDER — SACCHAROMYCES BOULARDII 250 MG PO CAPS: 250.0000 mg | ORAL_CAPSULE | Freq: Two times a day (BID) | ORAL | Status: DC

## 2022-08-11 MED ORDER — SODIUM CHLORIDE 0.9% FLUSH
3.0000 mL | Freq: Two times a day (BID) | INTRAVENOUS | Status: DC
  Administered 2022-08-11 – 2022-08-12 (×2): 3 mL via INTRAVENOUS

## 2022-08-11 MED ORDER — ACETAMINOPHEN 650 MG RE SUPP: 650.0000 mg | Freq: Four times a day (QID) | RECTAL | Status: DC | PRN

## 2022-08-11 MED ORDER — FOOD THICKENER (SIMPLYTHICK HONEY): 1.0000 | ORAL | Status: DC | PRN

## 2022-08-11 MED ORDER — ALBUTEROL SULFATE (2.5 MG/3ML) 0.083% IN NEBU: 2.5000 mg | INHALATION_SOLUTION | Freq: Four times a day (QID) | RESPIRATORY_TRACT | Status: DC | PRN

## 2022-08-11 MED ORDER — ACETAMINOPHEN 325 MG PO TABS
650.0000 mg | ORAL_TABLET | Freq: Four times a day (QID) | ORAL | Status: DC | PRN
  Administered 2022-08-12 (×2): 650 mg via ORAL
  Filled 2022-08-11 (×2): qty 2

## 2022-08-11 MED ORDER — SODIUM CHLORIDE 0.9 % IV SOLN: INTRAVENOUS | Status: DC

## 2022-08-11 MED ORDER — SODIUM CHLORIDE 0.9 % IV SOLN
2.0000 g | INTRAVENOUS | Status: DC
  Administered 2022-08-11 – 2022-08-12 (×2): 2 g via INTRAVENOUS
  Filled 2022-08-11: qty 20

## 2022-08-11 MED ORDER — SODIUM CHLORIDE 0.9 % IV SOLN: 1.0000 g | Freq: Once | INTRAVENOUS | Status: DC

## 2022-08-11 MED ORDER — APIXABAN 5 MG PO TABS
5.0000 mg | ORAL_TABLET | Freq: Two times a day (BID) | ORAL | Status: DC
  Administered 2022-08-11 – 2022-08-13 (×4): 5 mg via ORAL
  Filled 2022-08-11 (×4): qty 1

## 2022-08-11 MED ORDER — LACTATED RINGERS IV BOLUS
1000.0000 mL | Freq: Once | INTRAVENOUS | Status: AC
  Administered 2022-08-11: 1000 mL via INTRAVENOUS

## 2022-08-11 NOTE — H&P (Addendum)
History and Physical    Patient: Darrell [name] OZD:[id number] DOB: [birth year] DOA: 08/11/2022 DOS: the patient was seen and examined on 08/11/2022 PCP: [name], MD  Patient coming from: Home via EMS  Chief Complaint:  Chief Complaint  Patient presents with   Leg Swelling   HPI: Darrell [name] is a 71 y.o. male with medical history significant of hypertension, hyperlipidemia, CVA with residual left-sided weakness, ALS, chronic dysphagia, CKD stage IV, right lower extremity DVT on Eliquis, BPH, chronic Foley catheter who presents, and tobacco abuse with complaints of right leg swelling and pain over the last couple of days.  History is obtained from the patient's wife is present at bedside.  A couple days ago his wife notes that his leg was feverish to the touch and had an area of redness, but that had since resolved.  He was found to have a DVT in the right lower extremity back in August of this year and has been on Eliquis as prescribed.  Patient has been started on azithromycin 250 mg 3 times a week by his primary care provider on 11/7 for being at high risk for aspiration. His wife noted that he had been having low-grade fevers of 98 to 99.3 F, but this morning was elevated up to 100.1 F.  Patient chronically has a cough that she reports is unchanged from baseline related to his history of soft.  He continues to smoke about 1 to 2 cigarettes per per day on average.  They have been pureing his foods although have not been using the thickener as much because he does not like the texture.  Patient has been drinking fluids, but his wife notes that he is also been having up to 3 loose stools per day on average.  Denies any recent vomiting.  Normally he is alert and oriented at least to person and place.  This morning he asked his wife where they were when they were at home which was unusual.  These changes made her concerned for which she called EMS.  Over the last 6 months to a year she reports  that the patient has been less able to help with transfers.  He had last been admitted into the hospital for aspiration pneumonia of left lower lobe.  Also found to have concern for catheter associated UTI with cultures growing out Pseudomonas sensitive to ciprofloxacin.  In the emergency department patient was noted to be afebrile with heart rate 59, and O2 saturation currently maintained on room air.  Labs noted WBC 6.9, hemoglobin 10.4, BUN 57, and creatinine 2.89.  Chest x-ray noted low lung volumes with worsening left basilar opacity concerning for possible pneumonia.  Influenza and COVID-19 screening were negative.  Patient has been given 1 L of lactated Ringer's  Review of Systems: As mentioned in the history of present illness. All other systems reviewed and are negative. Past Medical History:  Diagnosis Date   Arthritis    Asthma    At high risk for falls 08/16/2015   CIDP (chronic inflammatory demyelinating polyneuropathy) ([name])    CKD (chronic kidney disease) stage 3, GFR 30-59 ml/min ([place]) 08/16/2015   Dysphagia as late effect of cerebrovascular disease    pts wife states pt has to eat soft foods    Elevated liver enzymes 08/10/2016   GERD (gastroesophageal reflux disease)    Glaucoma    High cholesterol    History of CVA with residual deficit 03/25/2013   Hypertension    Hypertensive  retinopathy of both eyes 01/16/2017   Inguinal hernia 03/25/2013   Liver hemangioma 08/14/2016   New onset seizure ([place]) 07/08/2017   seizure 07/14/18   Nuclear sclerosis of both eyes 10/25/2016   Pneumonia    Presence of permanent cardiac pacemaker    Primary open angle glaucoma of both eyes, indeterminate stage 10/25/2016   Renal mass, right 08/14/2016   Status cardiac pacemaker 01/29/2017   Placed for second degree heart block on 01/16/17 Medtronic Azure XT DR MRI SureScan dual-chamber pacemaker   Stroke [facility])    2011 with residual deficit left sided weakness   Tobacco dependence     Past Surgical History:  Procedure Laterality Date   EYE SURGERY     HERNIA REPAIR     INGUINAL HERNIA REPAIR Right 11/23/2014   Procedure: right inguinal hernia repair with mesh;  Surgeon: Armandina Gemma, MD;  Location: [facility];  Service: General;  Laterality: Right;   INSERTION OF MESH N/A 11/23/2014   Procedure: INSERTION OF MESH;  Surgeon: Armandina Gemma, MD;  Location: [facility];  Service: General;  Laterality: N/A;   JOINT REPLACEMENT     MASS EXCISION Left 08/29/2017   Procedure: EXCISION OF LEFT NECK MASS;  Surgeon: Coralie Keens, MD;  Location: [place];  Service: General;  Laterality: Left;   PACEMAKER IMPLANT N/A 01/16/2017   Procedure: Pacemaker Implant;  Surgeon: Will Meredith Leeds, MD;  Location: [place] [facility];  Service: Cardiovascular;  Laterality: N/A;   SHOULDER SURGERY Bilateral 1988, 1998   Social History:  reports that he has been smoking cigarettes. He has a 10.00 pack-year smoking history. He has never used smokeless tobacco. He reports that he does not drink alcohol and does not use drugs.  Allergies  Allergen Reactions   Ace Inhibitors Other (See Comments)    Hyperkalemia   Doxycycline Other (See Comments)    Hiccups, cough, nausea and emesis, elevated liver enzymes, elevated eosinophils, SOB concerning for early DRESS syndrome    Atacand Hct [Candesartan Cilexetil-Hctz] Hives   Shellfish Allergy Hives    Family History  Problem Relation Age of Onset   Hypertension Mother    Diabetes Sister    Hypertension Sister    Cancer Sister        1 sister   COPD Sister        in 1 sister   Stomach cancer Neg Hx    Colon cancer Neg Hx    Pancreatic cancer Neg Hx    Esophageal cancer Neg Hx     Prior to Admission medications   Medication Sig Start Date End Date Taking? Authorizing Provider  acetaminophen (TYLENOL) 325 MG tablet Take 325 mg by mouth daily as needed for mild pain or moderate pain.    [provider]  albuterol (VENTOLIN HFA) 108 (90  Base) MCG/ACT inhaler Inhale 2 puffs into the lungs every 6 (six) hours as needed for up to 30 days for Wheezing. 06/08/21   Argentina Donovan, PA-C  amLODipine (NORVASC) 10 MG tablet Take 1 tablet (10 mg total) by mouth daily. 06/07/22   [name], MD  apixaban (ELIQUIS) 5 MG TABS tablet Take 1 tablet (5 mg total) by mouth 2 (two) times daily. Upon completion of starter pack 06/07/22   [name], MD  atorvastatin (LIPITOR) 40 MG tablet Take 1 tablet (40 mg total) by mouth daily. 06/07/22   [name], MD  azithromycin (ZITHROMAX Z-PAK) 250 MG tablet Take 1 tablet (250 mg total) by mouth 3 (  three) times a week. 08/02/22   Olalere, Cicero Duck A, MD  brimonidine (ALPHAGAN) 0.2 % ophthalmic solution Place 1 drop into both eyes daily. 07/25/21   [provider]  cetirizine (ZYRTEC) 10 MG tablet Take 10 mg by mouth daily as needed for allergies.    [provider]  dicyclomine (BENTYL) 10 MG capsule Take 10 mg by mouth daily as needed for spasms (pain).    [provider]  dorzolamide-timolol (COSOPT) 22.3-6.8 MG/ML ophthalmic solution Place 1 drop into both eyes daily.    [provider]  ferrous sulfate (FEROSUL) 325 (65 FE) MG tablet Take 1 tablet (325 mg total) by mouth 2 (two) times daily with a meal. 06/08/21   McClung, Dionne Bucy, PA-C  finasteride (PROSCAR) 5 MG tablet Take 5 mg by mouth daily. 12/13/21   [provider]  fluticasone (FLONASE) 50 MCG/ACT nasal spray Place 1 spray into both nostrils daily as needed for allergies.    [provider]  fluticasone furoate-vilanterol (BREO ELLIPTA) 200-25 MCG/ACT AEPB Inhale 2 puffs into the lungs daily as needed (for shortness of breath).    [provider]  glycopyrrolate (ROBINUL) 1 MG tablet Take 1 tablet (1 mg total) by mouth 2 times daily. 06/07/22   [name], MD  guaiFENesin (MUCINEX) 600 MG 12 hr tablet Take 600 mg by mouth daily.    [provider]  hydrALAZINE  (APRESOLINE) 25 MG tablet Take 1 tablet (25 mg total) by mouth 3 (three) times daily. 04/25/22   Shirley Friar, PA-C  hydrocortisone (ANUSOL-HC) 2.5 % rectal cream Place 1 Application rectally 2 (two) times daily. 06/30/22   Vevelyn Francois, NP  latanoprost (XALATAN) 0.005 % ophthalmic solution Place 1 drop into both eyes at bedtime. 07/25/21   [provider]  levETIRAcetam (KEPPRA) 500 MG tablet Take 1 tablet (500 mg total) by mouth 2 (two) times daily. Patient not taking: Reported on 08/01/2022 12/19/21   Penumalli, Earlean Polka, MD  metoprolol succinate (TOPROL-XL) 50 MG 24 hr tablet Take 1 tablet (50 mg total) by mouth at bedtime. Take with or immediately following a meal. 04/04/22   Camnitz, Ocie Doyne, MD  montelukast (SINGULAIR) 10 MG tablet Take 1 tablet by mouth at bedtime. 06/19/22   [provider]  Multiple Vitamin (MULTI-VITAMIN) tablet Take 1 tablet by mouth daily.    [provider]  nystatin (MYCOSTATIN/NYSTOP) powder SMARTSIG:Packet(s) Topical Twice Daily Patient not taking: Reported on 08/01/2022 07/06/22   [provider]  ondansetron (ZOFRAN) 4 MG tablet Take 1 tablet (4 mg total) by mouth every 8 (eight) hours as needed for nausea or vomiting. 05/12/22   [name], MD  tamsulosin (FLOMAX) 0.4 MG CAPS capsule Take 1 capsule (0.4 mg total) by mouth daily. 01/24/22   [name], MD  torsemide (DEMADEX) 20 MG tablet Take 20 mg by mouth daily as needed (fluid).    [provider]    Physical Exam: Vitals:   08/11/22 1243 08/11/22 1247  BP:  (!) 147/73  Pulse:  (!) 59  Resp:  16  Temp:  97.7 F (36.5 C)  TempSrc:  Oral  SpO2: 94% 97%   Exam  Constitutional: Elderly male who is currently lethargic, but in no acute distress and will awaken currently leaning to the left side Eyes: Blind ENMT: Mucous membranes are dry. Neck: normal, supple  Respiratory: Decreased overall aeration with some crackles noted worse on the left  lung base.  O2 saturation currently maintained on room  air. Cardiovascular: Irregular irregular.  Trace right lower lower extremity edema.   Abdomen: no tenderness, no masses palpated. Bowel sounds positive.  Musculoskeletal: no clubbing / cyanosis.  Tenderness palpation of the right leg. Skin: no rashes, lesions, ulcers. No induration Neurologic:   Left-sided weakness. Psychiatric: Lethargic  Data Reviewed:  Reviewed labs, imaging, and pertinent records as noted above in HPI.  Assessment and Plan:  Chronic DVT on chronic anticoagulation Doppler ultrasound of the right lower extremity revealed chronic right lower extremity DVT which was initially diagnosed in August of this year.  Patient on Eliquis.  Question of post thrombotic syndrome as a cause of patient's pain. -Admit to a telemetry bed -Place TED hose if able -Continue Eliquis -Tylenol as needed for pain  Suspected aspiration pneumonia Patient has a chronic cough and history of dysphagia for prior stroke.  Family reports using a pured diet, but has not been using the honey thick liquids as much due to the patient not liking the texture.  Chest x-ray noted concern for worsening left lung opacity.  Wife noted low-grade fevers of 100.1 F today.  Patient currently without fever and white blood cell count within normal limits.  He had been started on empiric antibiotics of Rocephin and azithromycin, but has primary had started him on intermittent azithromycin to take 3 times weekly the week before.  Question cute aspiration pneumonia versus chronic bronchitis. -Aspiration precautions with elevation head of bed -Incentive spirometry and flutter valve -Check procalcitonin -Continue Rocephin and azithromycin.  De-escalate when medically appropriate.  Acute metabolic encephalopathy Patient was noted to be confused and not oriented to place which was unusual for him.  Question underlying infection especially with low-grade  fever. -Neurochecks  History of CVA with residual deficit dysphagia Patient with history of prior CVA resulting in left-sided weakness. -Aspiration precautions with elevation of head of bed -Pured diet with honey thickened liquids as previously recommended by speech therapy  Acute kidney injury superimposed on chronic kidney disease stage IV Patient presents with creatinine elevated up to 2.89 with a BUN of 57.  Baseline creatinine previously noted to be around 2.4 last month.  Patient had been given 1 L of IV fluids in the ED. Question is secondary to decreased p.o. intake due to thickeners. -Encourage fluid intake -Check urinalysis -Gentle IV fluid hydration -Recheck kidney function in a.m.  Anemia of chronic kidney disease Hemoglobin 10.4 and appears improved from prior. -Continue to monitor  Chronic Foley catheter BPH Patient's Foley catheter just recently been changed out last Thursday. -Continue current home medication regimen  Essential hypertension -Continue current blood pressure regimen  ALS -Continue outpatient follow-up with neurology  History of seizure disorder -Continue Keppra  Tobacco abuse  Patient has at least a 80 smoking pack-year history and still continues to smoke 1 to 2 cigarettes/day on average. -Continue to counsel on need of cessation of tobacco use  COPD, without exacerbation -Continue home inhalers and nebulized treatments as needed  Glaucoma  Patient has history of hypertensive retinopathy as well as glaucoma.] -Continue eyedrops   Diarrhea patient having up to 3 bowel movements per day on average. -Monitor intake and output -Consider further work-up with symptoms persist  DVT prophylaxis: Eliquis Advance Care Planning:   Code Status: DNR.  Confirmed with wife present at bedside  Consults: None  Family Communication: Wife updated at bedside  Severity of Illness: The appropriate patient status for this patient is INPATIENT.  Inpatient status is judged to be reasonable and necessary in order  to provide the required intensity of service to ensure the patient's safety. The patient's presenting symptoms, physical exam findings, and initial radiographic and laboratory data in the context of their chronic comorbidities is felt to place them at high risk for further clinical deterioration. Furthermore, it is not anticipated that the patient will be medically stable for discharge from the hospital within 2 midnights of admission.   * I certify that at the point of admission it is my clinical judgment that the patient will require inpatient hospital care spanning beyond 2 midnights from the point of admission due to high intensity of service, high risk for further deterioration and high frequency of surveillance required.*  Author: Norval Morton, MD 08/11/2022 5:17 PM  For on call review [email or web address].

## 2022-08-11 NOTE — ED Provider Notes (Signed)
[facility] EMERGENCY DEPARTMENT Provider Note   CSN: [id number] Arrival date & time: 08/11/22  1242     History Chief Complaint  Patient presents with   Leg Swelling    HPI Darrell [name] is a 71 y.o. male presenting for chief complaint of right lower extremity swelling and pain.  He endorses pain in his medial thigh.  He came in via EMS because he has a history of ALS and lives at home.Marland Kitchen  He is otherwise at his neurologic baseline.  He denies nausea vomiting, syncope or shortness of breath. Family also endorses progressive altered mental status, confusion, decreased p.o. intake, cough and fever this morning  Patient's recorded medical, surgical, social, medication list and allergies were reviewed in the Snapshot window as part of the initial history.   Review of Systems   Review of Systems  Constitutional:  Positive for fever. Negative for chills.  HENT:  Negative for ear pain and sore throat.   Eyes:  Negative for pain and visual disturbance.  Respiratory:  Positive for cough. Negative for choking and shortness of breath.   Cardiovascular:  Positive for leg swelling. Negative for chest pain and palpitations.  Gastrointestinal:  Negative for abdominal pain and vomiting.  Genitourinary:  Negative for dysuria and hematuria.  Musculoskeletal:  Negative for arthralgias and back pain.  Skin:  Negative for color change and rash.  Neurological:  Negative for seizures and syncope.  All other systems reviewed and are negative.   Physical Exam Updated Vital Signs BP (!) 147/73 (BP Location: Right Arm)   Pulse (!) 59   Temp 97.7 F (36.5 C) (Oral)   Resp 16   SpO2 97%  Physical Exam Vitals and nursing note reviewed.  Constitutional:      General: He is not in acute distress.    Appearance: He is well-developed.  HENT:     Head: Normocephalic and atraumatic.  Eyes:     Conjunctiva/sclera: Conjunctivae normal.  Cardiovascular:     Rate and Rhythm: Normal  rate and regular rhythm.     Heart sounds: No murmur heard. Pulmonary:     Effort: Pulmonary effort is normal. No respiratory distress.     Breath sounds: Normal breath sounds.  Abdominal:     Palpations: Abdomen is soft.     Tenderness: There is no abdominal tenderness.  Musculoskeletal:        General: Tenderness (TTP RLE, mid thigh) present. No swelling.     Cervical back: Neck supple.  Skin:    General: Skin is warm and dry.     Capillary Refill: Capillary refill takes less than 2 seconds.  Neurological:     Mental Status: He is alert.  Psychiatric:        Mood and Affect: Mood normal.      ED Course/ Medical Decision Making/ A&P Clinical Course as of 08/11/22 1637  Fri Aug 11, 2022  1459 DG Chest 2 View [CC]    Clinical Course User Index [CC] Tretha Sciara, MD    Procedures Procedures   Medications Ordered in ED Medications  cefTRIAXone (ROCEPHIN) 1 g in sodium chloride 0.9 % 100 mL IVPB (has no administration in time range)  azithromycin (ZITHROMAX) 500 mg in sodium chloride 0.9 % 250 mL IVPB (has no administration in time range)  lactated ringers bolus 1,000 mL (has no administration in time range)    Medical Decision Making:    Darrell [name] is a 71 y.o. male who presented  to the ED today with altered mental status detailed above.     Patient's presentation is complicated by their history of multiple comorbid medical conditions including ALS, requirement of family for completion of care..  Patient placed on continuous vitals and telemetry monitoring while in ED which was reviewed periodically.   Complete initial physical exam performed, notably the patient  was hemodynamically stable in no acute distress.      Reviewed and confirmed nursing documentation for past medical history, family history, social history.    Initial Assessment:   With the patient's presentation of fever cough congestion, most likely diagnosis is pneumonia causing delirium. Other  diagnoses were considered including (but not limited to) ACS, PE, Metabolic disturbance, COVID infection, influenza. These are considered less likely due to history of present illness and physical exam findings.   This is most consistent with an acute life/limb threatening illness complicated by underlying chronic conditions.  Initial Plan:  Screening labs including CBC and Metabolic panel to evaluate for infectious or metabolic etiology of disease.  Urinalysis with reflex culture ordered to evaluate for UTI or relevant urologic/nephrologic pathology.  CXR to evaluate for structural/infectious intrathoracic pathology.  EKG to evaluate for cardiac pathology. Objective evaluation as below reviewed with plan for close reassessment  Initial Study Results:   Laboratory  All laboratory results reviewed without evidence of clinically relevant pathology.    EKG EKG was reviewed independently. Rate, rhythm, axis, intervals all examined and without medically relevant abnormality. ST segments without concerns for elevations.    Radiology  All images reviewed independently. Agree with radiology report at this time.   DG Chest 2 View  Result Date: 08/11/2022 CLINICAL DATA:  Cough and fever.  Right leg swelling and warmth. EXAM: CHEST - 2 VIEW COMPARISON:  Chest radiographs 07/05/2022 FINDINGS: A dual lead pacemaker remains in place. The cardiomediastinal silhouette is unchanged with normal heart size. Lung volumes are lower than on the prior study, and there is increased patchy opacity in the left lung base. Minimal atelectasis is noted in the right lung base. No pleural effusion or pneumothorax is identified. IMPRESSION: Low lung volumes with worsening left basilar opacity which could reflect pneumonia. Electronically Signed   By: Logan Bores M.D.   On: 08/11/2022 13:49      Final Assessment and Plan:   Patient's history of present on his physicals and findings are most consistent with developing left  basilar pneumonia.  History of similar, fevers at home, started patient on ceftriaxone azithromycin and arranged for admission for pneumonia.   Disposition:   Based on the above findings, I believe this patient is stable for admission.    Patient/family educated about specific findings on our evaluation and explained exact reasons for admission.  Patient/family educated about clinical situation and time was allowed to answer questions.   Admission team communicated with and agreed with need for admission. Patient admitted.   Emergency Department Medication Summary:   Medications  cefTRIAXone (ROCEPHIN) 1 g in sodium chloride 0.9 % 100 mL IVPB (has no administration in time range)  azithromycin (ZITHROMAX) 500 mg in sodium chloride 0.9 % 250 mL IVPB (has no administration in time range)  lactated ringers bolus 1,000 mL (has no administration in time range)         Clinical Impression:  1. SOB (shortness of breath)   2. Pneumonia of left lower lobe due to infectious organism      Admit   Final Clinical Impression(s) / ED Diagnoses Final diagnoses:  SOB (shortness of breath)  Pneumonia of left lower lobe due to infectious organism    Rx / DC Orders ED Discharge Orders     None         Tretha Sciara, MD 08/11/22 [phone]

## 2022-08-11 NOTE — ED Triage Notes (Signed)
Pt BIB [facility] from home with increased right leg swelling above knee for past week, tender and warm per EMS. Family reported fever this am 100.2, tylenol at 0930. At neuro baseline, hx of ALS.

## 2022-08-11 NOTE — Progress Notes (Signed)
Right lower extremity venous duplex has been completed. Preliminary results can be found in CV Proc through chart review.  Results were given to Dr. Oswald Hillock.  08/11/22 4:43 PM Carlos Levering RVT

## 2022-08-11 NOTE — Telephone Encounter (Signed)
  Chief Complaint: R leg swelling Symptoms: swelling in leg, painful to touch, low grade temperature Frequency: 1 week Pertinent Negatives: Patient denies chest pain, trouble breathing Disposition: '[x]'$ ED /'[]'$ Urgent Care (no appt availability in office) / '[]'$ Appointment(In office/virtual)/ '[]'$  [facility]/ '[]'$ Home Care/ '[]'$ Refused Recommended Disposition /'[]'$ [place] [facility]/ '[]'$  Follow-up with PCP Additional Notes:     Reason for Disposition  [1] Swelling is painful to touch AND [2] fever  Answer Assessment - Initial Assessment Questions 1. ONSET: "When did the swelling start?" (e.g., minutes, hours, days)     Started off/on- 1 week or longer 2. LOCATION: "What part of the leg is swollen?"  "Are both legs swollen or just one leg?"     R leg 3. SEVERITY: "How bad is the swelling?" (e.g., localized; mild, moderate, severe)   - Localized: Small area of swelling localized to one leg.   - MILD pedal edema: Swelling limited to foot and ankle, pitting edema < 1/4 inch (6 mm) deep, rest and elevation eliminate most or all swelling.   - MODERATE edema: Swelling of lower leg to knee, pitting edema > 1/4 inch (6 mm) deep, rest and elevation only partially reduce swelling.   - SEVERE edema: Swelling extends above knee, facial or hand swelling present.      severe 4. REDNESS: "Does the swelling look red or infected?"     No-feels warmer than normal 5. PAIN: "Is the swelling painful to touch?" If Yes, ask: "How painful is it?"   (Scale 1-10; mild, moderate or severe)     Painful to touch, move, moderate-6 6. FEVER: "Do you have a fever?" If Yes, ask: "What is it, how was it measured, and when did it start?"      100.6- this morning- 99.3 now 7. CAUSE: "What do you think is causing the leg swelling?"     No idea 8. MEDICAL HISTORY: "Do you have a history of blood clots (e.g., DVT), cancer, heart failure, kidney disease, or liver failure?"     Patient is on blood thinner 9. RECURRENT  SYMPTOM: "Have you had leg swelling before?" If Yes, ask: "When was the last time?" "What happened that time?"     Not sure 10. OTHER SYMPTOMS: "Do you have any other symptoms?" (e.g., chest pain, difficulty breathing)       no 11. PREGNANCY: "Is there any chance you are pregnant?" "When was your last menstrual period?"       na  Protocols used: Leg Swelling and Edema-A-AH

## 2022-08-11 NOTE — Telephone Encounter (Signed)
Noted  

## 2022-08-11 NOTE — ED Notes (Signed)
Patient transported to X-ray 

## 2022-08-12 ENCOUNTER — Other Ambulatory Visit: Payer: Self-pay | Admitting: Family Medicine

## 2022-08-12 DIAGNOSIS — I82402 Acute embolism and thrombosis of unspecified deep veins of left lower extremity: Secondary | ICD-10-CM

## 2022-08-12 DIAGNOSIS — Z7901 Long term (current) use of anticoagulants: Secondary | ICD-10-CM

## 2022-08-12 LAB — CBC
HCT: 27.5 % — ABNORMAL LOW (ref 39.0–52.0)
Hemoglobin: 9 g/dL — ABNORMAL LOW (ref 13.0–17.0)
MCH: 29.3 pg (ref 26.0–34.0)
MCHC: 32.7 g/dL (ref 30.0–36.0)
MCV: 89.6 fL (ref 80.0–100.0)
Platelets: 178 10*3/uL (ref 150–400)
RBC: 3.07 MIL/uL — ABNORMAL LOW (ref 4.22–5.81)
RDW: 15.2 % (ref 11.5–15.5)
WBC: 5.9 10*3/uL (ref 4.0–10.5)
nRBC: 0 % (ref 0.0–0.2)

## 2022-08-12 LAB — BASIC METABOLIC PANEL
Anion gap: 6 (ref 5–15)
BUN: 53 mg/dL — ABNORMAL HIGH (ref 8–23)
CO2: 20 mmol/L — ABNORMAL LOW (ref 22–32)
Calcium: 9.3 mg/dL (ref 8.9–10.3)
Chloride: 113 mmol/L — ABNORMAL HIGH (ref 98–111)
Creatinine, Ser: 2.57 mg/dL — ABNORMAL HIGH (ref 0.61–1.24)
GFR, Estimated: 26 mL/min — ABNORMAL LOW (ref >60)
Glucose, Bld: 103 mg/dL — ABNORMAL HIGH (ref 70–99)
Potassium: 5 mmol/L (ref 3.5–5.1)
Sodium: 139 mmol/L (ref 135–145)

## 2022-08-12 LAB — URINE CULTURE: Culture: NO GROWTH

## 2022-08-12 MED ORDER — DORZOLAMIDE HCL-TIMOLOL MAL 2-0.5 % OP SOLN
1.0000 [drp] | Freq: Every day | OPHTHALMIC | Status: DC
  Administered 2022-08-12 – 2022-08-13 (×2): 1 [drp] via OPHTHALMIC
  Filled 2022-08-12: qty 10

## 2022-08-12 MED ORDER — GLYCOPYRROLATE 1 MG PO TABS
1.0000 mg | ORAL_TABLET | Freq: Two times a day (BID) | ORAL | Status: DC
  Administered 2022-08-12 – 2022-08-13 (×3): 1 mg via ORAL
  Filled 2022-08-12 (×4): qty 1

## 2022-08-12 MED ORDER — FLUTICASONE FUROATE-VILANTEROL 200-25 MCG/ACT IN AEPB
2.0000 | INHALATION_SPRAY | Freq: Every day | RESPIRATORY_TRACT | Status: DC | PRN
  Filled 2022-08-12: qty 28

## 2022-08-12 MED ORDER — ONDANSETRON HCL 4 MG PO TABS: 4.0000 mg | ORAL_TABLET | Freq: Three times a day (TID) | ORAL | Status: DC | PRN

## 2022-08-12 MED ORDER — ZINC OXIDE 12.8 % EX OINT
TOPICAL_OINTMENT | Freq: Four times a day (QID) | CUTANEOUS | Status: DC | PRN
  Filled 2022-08-12: qty 56.7

## 2022-08-12 MED ORDER — GUAIFENESIN ER 600 MG PO TB12
600.0000 mg | ORAL_TABLET | Freq: Every day | ORAL | Status: DC
  Administered 2022-08-13: 600 mg via ORAL
  Filled 2022-08-12 (×2): qty 1

## 2022-08-12 MED ORDER — LEVETIRACETAM 500 MG PO TABS
500.0000 mg | ORAL_TABLET | Freq: Two times a day (BID) | ORAL | Status: DC
  Administered 2022-08-12 – 2022-08-13 (×3): 500 mg via ORAL
  Filled 2022-08-12 (×3): qty 1

## 2022-08-12 MED ORDER — MONTELUKAST SODIUM 10 MG PO TABS
10.0000 mg | ORAL_TABLET | Freq: Every day | ORAL | Status: DC
  Administered 2022-08-12: 10 mg via ORAL
  Filled 2022-08-12: qty 1

## 2022-08-12 MED ORDER — HYDROCORTISONE (PERIANAL) 2.5 % EX CREA
1.0000 | TOPICAL_CREAM | Freq: Two times a day (BID) | CUTANEOUS | Status: DC
  Administered 2022-08-12 – 2022-08-13 (×3): 1 via RECTAL
  Filled 2022-08-12: qty 28.35

## 2022-08-12 MED ORDER — FINASTERIDE 5 MG PO TABS
5.0000 mg | ORAL_TABLET | Freq: Every day | ORAL | Status: DC
  Administered 2022-08-12 – 2022-08-13 (×2): 5 mg via ORAL
  Filled 2022-08-12 (×2): qty 1

## 2022-08-12 MED ORDER — METOPROLOL SUCCINATE ER 50 MG PO TB24
50.0000 mg | ORAL_TABLET | Freq: Every day | ORAL | Status: DC
  Administered 2022-08-12: 50 mg via ORAL
  Filled 2022-08-12: qty 1

## 2022-08-12 MED ORDER — LORATADINE 10 MG PO TABS
10.0000 mg | ORAL_TABLET | Freq: Every day | ORAL | Status: DC
  Administered 2022-08-12 – 2022-08-13 (×2): 10 mg via ORAL
  Filled 2022-08-12 (×2): qty 1

## 2022-08-12 MED ORDER — AMLODIPINE BESYLATE 10 MG PO TABS
10.0000 mg | ORAL_TABLET | Freq: Every day | ORAL | Status: DC
  Administered 2022-08-12 – 2022-08-13 (×2): 10 mg via ORAL
  Filled 2022-08-12 (×2): qty 1

## 2022-08-12 MED ORDER — TAMSULOSIN HCL 0.4 MG PO CAPS
0.4000 mg | ORAL_CAPSULE | Freq: Every day | ORAL | Status: DC
  Administered 2022-08-12 – 2022-08-13 (×2): 0.4 mg via ORAL
  Filled 2022-08-12 (×2): qty 1

## 2022-08-12 MED ORDER — HYDRALAZINE HCL 25 MG PO TABS
25.0000 mg | ORAL_TABLET | Freq: Three times a day (TID) | ORAL | Status: DC
  Administered 2022-08-12 – 2022-08-13 (×4): 25 mg via ORAL
  Filled 2022-08-12 (×4): qty 1

## 2022-08-12 MED ORDER — FLUTICASONE PROPIONATE 50 MCG/ACT NA SUSP
1.0000 | Freq: Every day | NASAL | Status: DC | PRN
  Filled 2022-08-12: qty 16

## 2022-08-12 MED ORDER — CHLORHEXIDINE GLUCONATE CLOTH 2 % EX PADS
6.0000 | MEDICATED_PAD | Freq: Every day | CUTANEOUS | Status: DC
  Administered 2022-08-12 – 2022-08-13 (×2): 6 via TOPICAL

## 2022-08-12 MED ORDER — BRIMONIDINE TARTRATE 0.2 % OP SOLN
1.0000 [drp] | Freq: Every day | OPHTHALMIC | Status: DC
  Administered 2022-08-12 – 2022-08-13 (×2): 1 [drp] via OPHTHALMIC
  Filled 2022-08-12: qty 5

## 2022-08-12 MED ORDER — ADULT MULTIVITAMIN W/MINERALS CH
1.0000 | ORAL_TABLET | Freq: Every day | ORAL | Status: DC
  Administered 2022-08-12 – 2022-08-13 (×2): 1 via ORAL
  Filled 2022-08-12 (×2): qty 1

## 2022-08-12 MED ORDER — NYSTATIN 100000 UNIT/GM EX POWD
1.0000 | Freq: Two times a day (BID) | CUTANEOUS | Status: DC
  Administered 2022-08-12 – 2022-08-13 (×3): 1 via TOPICAL
  Filled 2022-08-12: qty 15

## 2022-08-12 MED ORDER — LATANOPROST 0.005 % OP SOLN
1.0000 [drp] | Freq: Every day | OPHTHALMIC | Status: DC
  Administered 2022-08-12: 1 [drp] via OPHTHALMIC
  Filled 2022-08-12: qty 2.5

## 2022-08-12 NOTE — Progress Notes (Signed)
PROGRESS NOTE    [name]  WGN:[id number] DOB: 07-Mar-1951 DOA: 08/11/2022 PCP: [name], MD    Brief Narrative:   Darrell [name] is a 71 y.o. male with medical history significant of hypertension, hyperlipidemia, CVA with residual left-sided weakness, ALS, chronic dysphagia, CKD stage IV, right lower extremity DVT on Eliquis, BPH on chronic Foley catheter presented to hospital with right leg swelling and pain for couple of days.  Of note patient has history of DVT in the right lower extremity back in August of this year and has been on Eliquis as prescribed.  Patient was recently prescribed azithromycin for high risk of aspiration and was having low-grade fever up to 100.1 F at home.  Has a chronic cough which is unchanged.  He however continues to smoke 1 to 2 cigarettes a day.  Has been using pured food at home and has been having loose stools as well up to 3 loose stools per day on average. Normally he is alert and oriented at least to person and place.  In the past patient was admitted hospital for aspiration pneumonia and catheter associated UTI.  In the ED, patient was afebrile, WBC at 6.9.  Creatinine elevated at 2.8.  Chest x-ray showed low lung volumes with worsening left lung opacity concerning for possible pneumonia.  Influenza and COVID-19 screening were negative.  Patient was given IV fluids and was admitted hospital for further evaluation and treatment.  Assessment and Plan:   Principal Problem:   Chronic deep vein thrombosis (DVT) ([name]) Active Problems:   Chronic anticoagulation   Aspiration pneumonia ([name])   Acute metabolic encephalopathy   History of CVA with residual deficit   Dysphagia   Acute kidney injury superimposed on chronic kidney disease ([name])   Anemia, chronic disease   Benign prostatic hyperplasia   Chronic indwelling Foley catheter   Essential hypertension   ALS (amyotrophic lateral sclerosis) ([name])   Seizure disorder ([name])   Tobacco abuse    Diarrhea   Chronic DVT on chronic anticoagulation Duplex ultrasound of the right lower extremity showed chronic DVT, history of DVT diagnosed on August 2023.  On Eliquis as outpatient.  Continue Eliquis.  Tylenol as needed for pain.     Suspected aspiration pneumonia Patient with history of cough and dysphagia from previous stroke.  On pured diet at home.  Chest x-ray concerning for left lung opacity with fever of 100.1 F.  No leukocytosis.  On Rocephin and Zithromax at this time.  Patient was started on intermittent azithromycin 3 times weekly by his PCP.  Continue aspiration precautions, incentive spirometry flutter valve   Acute metabolic encephalopathy Patient was noted to be confused and not oriented to place which was unusual for him.  We will continue to monitor.  Appears to be little more alert today as per the patient's family.  History of CVA with residual deficit dysphagia Previous CVA with left-sided residual weakness.  Continue aspiration precautions.  Pured diet with honey thick liquids.   Acute kidney injury superimposed on chronic kidney disease stage IV Initial creatinine up to 2.89 with a BUN of 57.  Baseline creatinine previously noted to be around 2.4 last month.  Received IV fluid bolus in the ED.  Check a urinalysis.  Continue to monitor BMP.  Creatinine today at 2.5.  Patient is on torsemide at home we will hold.  Continue tamsulosin and finasteride.  Anemia of chronic kidney disease Hemoglobin today at 9.0.  Likely at baseline.  We will  continue to monitor.   Chronic Foley catheter with history of BPH Patient's Foley catheter just recently been changed out last Thursday.  Continue Foley catheter.  Continue finasteride and tamsulosin.   Essential hypertension Patient is on amlodipine hydralazine and metoprolol at home.  Will resume.   ALS Follows up up with neurology as outpatient.  Patient is independent with ADLs at home and has been helped by the family.    History of seizure disorder Continue Keppra.   Tobacco abuse  Patient has at least a 80 smoking pack-year history and still continues to smoke 1 to 2 cigarettes/day on average.  Patient was encouraged quitting smoking.   COPD, without exacerbation Continue nebulizers and inhalers.   Glaucoma  Continue eyedrops.  Diarrhea 3 bowel movements every day on average.  We will continue to monitor while in the hospital.  We will add probiotics.      DVT prophylaxis: Place TED hose Start: 08/11/22 1737 apixaban (ELIQUIS) tablet 5 mg   Code Status:     Code Status: DNR  Disposition: Home with home health  Status is: Inpatient  Remains inpatient appropriate because: IV fluids, altered mental status, IV antibiotic possible aspiration pneumonia   Family Communication: Spoke with the patient's spouse at bedside.  Consultants:  None  Procedures:  None  Antimicrobials:  Rocephin and Zithromax-08/11/22  Anti-infectives (From admission, onward)    Start     Dose/Rate Route Frequency Ordered Stop   08/11/22 1745  cefTRIAXone (ROCEPHIN) 2 g in sodium chloride 0.9 % 100 mL IVPB        2 g 200 mL/hr over 30 Minutes Intravenous Every 24 hours 08/11/22 1736     08/11/22 1600  cefTRIAXone (ROCEPHIN) 1 g in sodium chloride 0.9 % 100 mL IVPB  Status:  Discontinued        1 g 200 mL/hr over 30 Minutes Intravenous  Once 08/11/22 1545 08/11/22 1736   08/11/22 1600  azithromycin (ZITHROMAX) 500 mg in sodium chloride 0.9 % 250 mL IVPB        500 mg 250 mL/hr over 60 Minutes Intravenous  Once 08/11/22 1545          Subjective: Today, patient was seen and examined at bedside.  Patient's spouse at bedside.  Appears to be little more alert but still complains of fatigue and weakness.  Objective: Vitals:   08/12/22 0127 08/12/22 0300 08/12/22 0500 08/12/22 0907  BP: (!) 160/75  (!) 156/95 (!) 152/77  Pulse: 60  60 61  Resp:   18   Temp: 98.2 F (36.8 C)  97.9 F (36.6 C) 98.1 F (36.7  C)  TempSrc: Oral   Oral  SpO2: 99%  96% 98%  Weight:  78.2 kg      Intake/Output Summary (Last 24 hours) at 08/12/2022 1056 Last data filed at 08/12/2022 0900 Gross per 24 hour  Intake 807.5 ml  Output 300 ml  Net 507.5 ml   Filed Weights   08/12/22 0300  Weight: 78.2 kg    Physical Examination: Body mass index is 21.26 kg/m.  General:  Average built, not in obvious distress, alert awake mildly responsive, HENT:   No scleral pallor or icterus noted. Oral mucosa is moist.  Chest:  Clear breath sounds.  Diminished breath sounds bilaterally. No crackles or wheezes.  CVS: S1 &S2 heard. No murmur.  Regular rate and rhythm. Abdomen: Soft, nontender, nondistended.  Bowel sounds are heard.   Extremities: No cyanosis, clubbing with bilateral lower extremity edema.  Peripheral pulses are palpable. Psych: Alert awake, more responsive today. CNS:  No cranial nerve deficits.  Left-sided weakness from previous CVA. Skin: Warm and dry.  No rashes noted.  Data Reviewed:   CBC: Recent Labs  Lab 08/11/22 1433 08/12/22 0508  WBC 6.9 5.9  NEUTROABS 3.6  --   HGB 10.4* 9.0*  HCT 33.0* 27.5*  MCV 91.9 89.6  PLT 200 440    Basic Metabolic Panel: Recent Labs  Lab 08/11/22 1433 08/12/22 0508  NA 141 139  K 4.8 5.0  CL 115* 113*  CO2 21* 20*  GLUCOSE 100* 103*  BUN 57* 53*  CREATININE 2.89* 2.57*  CALCIUM 9.4 9.3    Liver Function Tests: No results for input(s): "AST", "ALT", "ALKPHOS", "BILITOT", "PROT", "ALBUMIN" in the last 168 hours.   Radiology Studies: VAS [place] LOWER EXTREMITY VENOUS (DVT)  Result Date: 08/12/2022  Lower Venous DVT Study Patient Name:  BAYLER GEHRIG  Date of Exam:   08/11/2022 Medical Rec #: [id number]      Accession #:    [id number] Date of Birth: Feb 21, 1951      Patient Gender: M Patient Age:   62 years Exam Location:  [facility] Procedure:      VAS [place] LOWER EXTREMITY VENOUS (DVT) Referring Phys: Cheri Rous COUNTRYMAN  --------------------------------------------------------------------------------  Indications: Swelling.  Risk Factors: DVT. Limitations: Poor ultrasound/tissue interface and patient positioning. Comparison Study: 05/17/2022 - RIGHT:                   - Findings consistent with acute deep vein thrombosis                   involving the right                   common femoral vein, right femoral vein, right proximal                   profunda vein,                   right popliteal vein, right posterior tibial veins, and right                   peroneal                   veins.                    LEFT:                   - No evidence of deep vein thrombosis in the lower extremity.                   No indirect                   evidence of obstruction proximal to the inguinal ligament.                   - Wall thickening and diminutive vein size noted on the left. Performing Technologist: Oliver Hum RVT  Examination Guidelines: A complete evaluation includes B-mode imaging, spectral Doppler, color Doppler, and power Doppler as needed of all accessible portions of each vessel. Bilateral testing is considered an integral part of a complete examination. Limited examinations for reoccurring indications may be performed as noted. The reflux portion of the exam is performed with the patient in reverse Trendelenburg.  +---------+---------------+---------+-----------+----------+-------------------+ RIGHT    CompressibilityPhasicitySpontaneityPropertiesThrombus Aging      +---------+---------------+---------+-----------+----------+-------------------+  CFV      Full           Yes      Yes                                      +---------+---------------+---------+-----------+----------+-------------------+ SFJ      Full                                                             +---------+---------------+---------+-----------+----------+-------------------+ FV Prox  Full                                                              +---------+---------------+---------+-----------+----------+-------------------+ FV Mid   Partial        Yes      Yes                  Chronic             +---------+---------------+---------+-----------+----------+-------------------+ FV DistalPartial        Yes      Yes                  Chronic             +---------+---------------+---------+-----------+----------+-------------------+ PFV      Full                                                             +---------+---------------+---------+-----------+----------+-------------------+ POP      Partial        Yes      Yes                  Chronic             +---------+---------------+---------+-----------+----------+-------------------+ PTV      Full                                                             +---------+---------------+---------+-----------+----------+-------------------+ PERO                                                  Not well visualized +---------+---------------+---------+-----------+----------+-------------------+   +----+---------------+---------+-----------+----------+--------------+ LEFTCompressibilityPhasicitySpontaneityPropertiesThrombus Aging +----+---------------+---------+-----------+----------+--------------+ CFV Full           Yes      Yes                                 +----+---------------+---------+-----------+----------+--------------+     Summary: RIGHT: -  Findings consistent with chronic deep vein thrombosis involving the right femoral vein, and right popliteal vein. - No cystic structure found in the popliteal fossa.  LEFT: - No evidence of common femoral vein obstruction.  *See table(s) above for measurements and observations. Electronically signed by Deitra Mayo MD on 08/12/2022 at 6:26:03 AM.    Final    DG Chest 2 View  Result Date: 08/11/2022 CLINICAL DATA:  Cough and fever.  Right leg swelling and warmth. EXAM:  CHEST - 2 VIEW COMPARISON:  Chest radiographs 07/05/2022 FINDINGS: A dual lead pacemaker remains in place. The cardiomediastinal silhouette is unchanged with normal heart size. Lung volumes are lower than on the prior study, and there is increased patchy opacity in the left lung base. Minimal atelectasis is noted in the right lung base. No pleural effusion or pneumothorax is identified. IMPRESSION: Low lung volumes with worsening left basilar opacity which could reflect pneumonia. Electronically Signed   By: Logan Bores M.D.   On: 08/11/2022 13:49      LOS: 1 day    Flora Lipps, MD [facility] Available via Epic secure chat 7am-7pm After these hours, please refer to coverage provider listed on [email or web address] 08/12/2022, 10:56 AM

## 2022-08-12 NOTE — Hospital Course (Signed)
Darrell [name] is a 71 y.o. male with medical history significant of hypertension, hyperlipidemia, CVA with residual left-sided weakness, ALS, chronic dysphagia, CKD stage IV, right lower extremity DVT on Eliquis, BPH on chronic Foley catheter presented to hospital with right leg swelling and pain for couple of days.  Of note patient has history of DVT in the right lower extremity back in August of this year and has been on Eliquis as prescribed.  Patient was recently prescribed azithromycin for high risk of aspiration and was having low-grade fever up to 100.1 F at home.  Has a chronic cough which is unchanged.  He however continues to smoke 1 to 2 cigarettes a day.  Has been using pured food at home and has been having loose stools as well up to 3 loose stools per day on average. Normally he is alert and oriented at least to person and place.  In the past patient was admitted hospital for aspiration pneumonia and catheter associated UTI.  In the ED, patient was afebrile, WBC at 6.9.  Creatinine elevated at 2.8.  Chest x-ray showed low lung volumes with worsening left lung opacity concerning for possible pneumonia.  Influenza and COVID-19 screening were negative.  Patient was given IV fluids and was admitted hospital for further evaluation and treatment.  Assessment and Plan:   Chronic DVT on chronic anticoagulation Duplex ultrasound of the right lower extremity showed chronic DVT, history of DVT diagnosed on August 2023.  On Eliquis as outpatient.  Continue Eliquis.  Tylenol as needed for pain.     Suspected aspiration pneumonia Patient with history of cough and dysphagia from previous stroke.  On pured diet at home.  Chest x-ray concerning for left lung opacity with fever of 100.1 F.  No leukocytosis.  On Rocephin and Zithromax at this time.  Patient was started on intermittent azithromycin 3 times weekly by his PCP.  Continue aspiration precautions, incentive spirometry flutter valve   Acute  metabolic encephalopathy Patient was noted to be confused and not oriented to place which was unusual for him.  We will continue to monitor.  History of CVA with residual deficit dysphagia Previous CVA with left-sided residual weakness.  Continue aspiration precautions.  Pured diet with honey thick liquids.   Acute kidney injury superimposed on chronic kidney disease stage IV Initial creatinine up to 2.89 with a BUN of 57.  Baseline creatinine previously noted to be around 2.4 last month.  Received IV fluid bolus in the ED.  Check a urinalysis.  Continue to monitor BMP.  Creatinine today at 2.5.  Patient is on torsemide at home we will hold.  Continue tamsulosin and finasteride.  Anemia of chronic kidney disease Hemoglobin today at 9.0.  Likely at baseline.  We will continue to monitor.   Chronic Foley catheter with history of BPH Patient's Foley catheter just recently been changed out last Thursday.  Continue Foley catheter.  Continue finasteride and tamsulosin.   Essential hypertension Patient is on amlodipine hydralazine and metoprolol at home.  Will resume.   ALS Follows up up with neurology as outpatient.   History of seizure disorder Continue Keppra.   Tobacco abuse  Patient has at least a 80 smoking pack-year history and still continues to smoke 1 to 2 cigarettes/day on average.  Patient was encouraged quitting smoking.   COPD, without exacerbation Continue nebulizers and inhalers.   Glaucoma  Continue eyedrops.  Diarrhea 3 bowel movements every day on average.  We will continue to monitor while  in the hospital.

## 2022-08-13 DIAGNOSIS — L89892 Pressure ulcer of other site, stage 2: Secondary | ICD-10-CM | POA: Diagnosis not present

## 2022-08-13 DIAGNOSIS — R059 Cough, unspecified: Secondary | ICD-10-CM | POA: Diagnosis not present

## 2022-08-13 DIAGNOSIS — J3489 Other specified disorders of nose and nasal sinuses: Secondary | ICD-10-CM | POA: Diagnosis not present

## 2022-08-13 DIAGNOSIS — R5383 Other fatigue: Secondary | ICD-10-CM | POA: Diagnosis not present

## 2022-08-13 DIAGNOSIS — N400 Enlarged prostate without lower urinary tract symptoms: Secondary | ICD-10-CM | POA: Diagnosis not present

## 2022-08-13 DIAGNOSIS — J690 Pneumonitis due to inhalation of food and vomit: Secondary | ICD-10-CM | POA: Diagnosis not present

## 2022-08-13 DIAGNOSIS — R4182 Altered mental status, unspecified: Secondary | ICD-10-CM | POA: Diagnosis not present

## 2022-08-13 DIAGNOSIS — R531 Weakness: Secondary | ICD-10-CM | POA: Diagnosis not present

## 2022-08-13 DIAGNOSIS — N179 Acute kidney failure, unspecified: Secondary | ICD-10-CM | POA: Diagnosis not present

## 2022-08-13 DIAGNOSIS — R197 Diarrhea, unspecified: Secondary | ICD-10-CM | POA: Diagnosis not present

## 2022-08-13 DIAGNOSIS — I82501 Chronic embolism and thrombosis of unspecified deep veins of right lower extremity: Secondary | ICD-10-CM | POA: Diagnosis not present

## 2022-08-13 DIAGNOSIS — G1221 Amyotrophic lateral sclerosis: Secondary | ICD-10-CM | POA: Diagnosis not present

## 2022-08-13 DIAGNOSIS — I10 Essential (primary) hypertension: Secondary | ICD-10-CM | POA: Diagnosis not present

## 2022-08-13 DIAGNOSIS — Z7901 Long term (current) use of anticoagulants: Secondary | ICD-10-CM | POA: Diagnosis not present

## 2022-08-13 DIAGNOSIS — Z743 Need for continuous supervision: Secondary | ICD-10-CM | POA: Diagnosis not present

## 2022-08-13 DIAGNOSIS — G1220 Motor neuron disease, unspecified: Secondary | ICD-10-CM | POA: Diagnosis not present

## 2022-08-13 DIAGNOSIS — G40909 Epilepsy, unspecified, not intractable, without status epilepticus: Secondary | ICD-10-CM | POA: Diagnosis not present

## 2022-08-13 DIAGNOSIS — R1310 Dysphagia, unspecified: Secondary | ICD-10-CM | POA: Diagnosis not present

## 2022-08-13 DIAGNOSIS — G9341 Metabolic encephalopathy: Secondary | ICD-10-CM | POA: Diagnosis not present

## 2022-08-13 DIAGNOSIS — Z978 Presence of other specified devices: Secondary | ICD-10-CM | POA: Diagnosis not present

## 2022-08-13 DIAGNOSIS — I6930 Unspecified sequelae of cerebral infarction: Secondary | ICD-10-CM | POA: Diagnosis not present

## 2022-08-13 MED ORDER — LEVOFLOXACIN 750 MG PO TABS: 750.0000 mg | ORAL_TABLET | ORAL | 0 refills | Status: AC

## 2022-08-13 MED ORDER — AZITHROMYCIN 250 MG PO TABS: 250.0000 mg | ORAL_TABLET | ORAL | 6 refills | Status: DC

## 2022-08-13 NOTE — Discharge Summary (Signed)
Physician Discharge Summary   Patient: Darrell [name] MRN: [id number] DOB: 1951/02/13  Admit date:     08/11/2022  Discharge date: 08/13/22  Discharge Physician: Flora Lipps   PCP: [name], MD   Recommendations at discharge:   Follow-up with your primary care provider in 1 week.   Check CBC BMP magnesium in the next visit.  Discharge Diagnoses: Principal Problem:   Chronic deep vein thrombosis (DVT) ([name]) Active Problems:   Chronic anticoagulation   Aspiration pneumonia ([name])   Acute metabolic encephalopathy   History of CVA with residual deficit   Dysphagia   Acute kidney injury superimposed on chronic kidney disease ([name])   Anemia, chronic disease   Benign prostatic hyperplasia   Chronic indwelling Foley catheter   Essential hypertension   ALS (amyotrophic lateral sclerosis) ([name])   Seizure disorder ([name])   Tobacco abuse   Diarrhea  Resolved Problems:   * No resolved hospital problems. *  Hospital Course:  [name] is a 71 y.o. male with medical history significant of hypertension, hyperlipidemia, CVA with residual left-sided weakness, ALS, chronic dysphagia, CKD stage IV, right lower extremity DVT on Eliquis, BPH on chronic Foley catheter presented to hospital with right leg swelling and pain for couple of days.  Of note patient has history of DVT in the right lower extremity back in August of this year and has been on Eliquis as prescribed.  Patient was recently prescribed azithromycin for high risk of aspiration and was having low-grade fever up to 100.1 F at home.  Has a chronic cough which is unchanged.  He however continues to smoke 1 to 2 cigarettes a day.  Has been using pured food at home and has been having loose stools as well up to 3 loose stools per day on average. Normally he is alert and oriented at least to person and place.  In the past, patient was admitted hospital for aspiration pneumonia and catheter associated UTI.  In the ED, patient was  afebrile, WBC at 6.9.  Creatinine elevated at 2.8.  Chest x-ray showed low lung volumes with worsening left lung opacity concerning for possible pneumonia.  Influenza and COVID-19 screening were negative.  Patient was given IV fluids and was admitted hospital for further evaluation and treatment.  Following conditions were addressed during hospitalization,   Chronic DVT on chronic anticoagulation Duplex ultrasound of the right lower extremity showed chronic DVT, history of DVT diagnosed on August 2023.  Continue Eliquis from home on discharge.  Suspected aspiration pneumonia Patient with history of cough and dysphagia from previous stroke.  On pured diet at home.  Chest x-ray concerning for left lung opacity with fever of 100.1 F.  No leukocytosis.  Received Rocephin and Zithromax at this time.  Patient was started on intermittent azithromycin 3 times weekly by his PCP.  We will continue Levaquin 750 every 48 hours for next 2 doses on discharge to complete the course.  Pressure max of 99.5 F.  Acute metabolic encephalopathy Proved at this time.  At baseline per the family.  History of CVA with residual deficit dysphagia Previous CVA with left-sided residual weakness.  Continue aspiration precautions on discharge.  Spoke with the patient's family at bedside..  Continue pured diet with honey thick liquids.   Acute kidney injury superimposed on chronic kidney disease stage IV Initial creatinine up to 2.89 with a BUN of 57.  Baseline creatinine previously noted to be around 2.4 last month.  Creatinine today at 2.4 and  at baseline.    Continue tamsulosin and finasteride.  Resume torsemide from home.  Anemia of chronic kidney disease Hemoglobin today at 9.0.  Likely at baseline.  We will continue to monitor.   Chronic Foley catheter with history of BPH Patient's foley catheter just recently been changed out last Thursday.  Continue Foley catheter on discharge.  Continue finasteride and  tamsulosin.   Essential hypertension Patient is on amlodipine, hydralazine and metoprolol at home.  Will resume on discharge..   ALS Follows up up with neurology as outpatient.   History of seizure disorder Continue Keppra.   Tobacco abuse  Patient has at least a 80 smoking pack-year history and still continues to smoke 1 to 2 cigarettes/day on average.  Patient was encouraged quitting smoking.   COPD, without exacerbation Continue nebulizers and inhalers.   Glaucoma  Continue eyedrops.  Diarrhea 3 bowel movements every day on average.  We will continue to monitor while in the hospital.   Pressure injury sacral stage II. We will arrange for hospital bed air mattress on discharge.  TOC consulted.   Consultants: None Procedures performed: None Disposition: Home  Diet recommendation:  Cardiac diet  DISCHARGE MEDICATION: Allergies as of 08/13/2022       Reactions   Ace Inhibitors Other (See Comments)   Hyperkalemia   Doxycycline Other (See Comments)   Hiccups, cough, nausea and emesis, elevated liver enzymes, elevated eosinophils, SOB concerning for early DRESS syndrome    Atacand Hct [candesartan Cilexetil-hctz] Hives   Shellfish Allergy Hives        Medication List     STOP taking these medications    ciprofloxacin 500 MG tablet Commonly known as: CIPRO       TAKE these medications    acetaminophen 325 MG tablet Commonly known as: TYLENOL Take 325 mg by mouth daily as needed for mild pain or moderate pain.   albuterol 108 (90 Base) MCG/ACT inhaler Commonly known as: VENTOLIN HFA Inhale 2 puffs into the lungs every 6 (six) hours as needed for up to 30 days for Wheezing.   amLODipine 10 MG tablet Commonly known as: NORVASC Take 1 tablet (10 mg total) by mouth daily.   apixaban 5 MG Tabs tablet Commonly known as: ELIQUIS Take 1 tablet (5 mg total) by mouth 2 (two) times daily. Upon completion of starter pack   atorvastatin 40 MG tablet Commonly  known as: LIPITOR Take 1 tablet (40 mg total) by mouth daily.   azithromycin 250 MG tablet Commonly known as: Zithromax Z-Pak Take 1 tablet (250 mg total) by mouth 3 (three) times a week. Start taking on: August 19, 2022 What changed: These instructions start on August 19, 2022. If you are unsure what to do until then, ask your doctor or other care provider.   Breo Ellipta 200-25 MCG/ACT Aepb Generic drug: fluticasone furoate-vilanterol Inhale 2 puffs into the lungs daily as needed (for shortness of breath).   brimonidine 0.2 % ophthalmic solution Commonly known as: ALPHAGAN Place 1 drop into both eyes daily.   cetirizine 10 MG tablet Commonly known as: ZYRTEC Take 10 mg by mouth daily as needed for allergies.   dicyclomine 10 MG capsule Commonly known as: BENTYL Take 10 mg by mouth daily. spasms   dorzolamide-timolol 2-0.5 % ophthalmic solution Commonly known as: COSOPT Place 1 drop into both eyes daily.   ferrous sulfate 325 (65 FE) MG tablet Commonly known as: FeroSul Take 1 tablet (325 mg total) by mouth 2 (two) times daily  with a meal.   finasteride 5 MG tablet Commonly known as: PROSCAR Take 5 mg by mouth daily.   fluticasone 50 MCG/ACT nasal spray Commonly known as: FLONASE Place 1 spray into both nostrils daily as needed for allergies.   glycopyrrolate 1 MG tablet Commonly known as: ROBINUL Take 1 tablet (1 mg total) by mouth 2 times daily.   guaiFENesin 600 MG 12 hr tablet Commonly known as: MUCINEX Take 600 mg by mouth daily.   hydrALAZINE 25 MG tablet Commonly known as: APRESOLINE Take 1 tablet (25 mg total) by mouth 3 (three) times daily.   hydrocortisone 2.5 % rectal cream Commonly known as: ANUSOL-HC Place 1 Application rectally 2 (two) times daily.   latanoprost 0.005 % ophthalmic solution Commonly known as: XALATAN Place 1 drop into both eyes at bedtime.   levETIRAcetam 500 MG tablet Commonly known as: KEPPRA Take 1 tablet (500 mg  total) by mouth 2 (two) times daily.   levofloxacin 750 MG tablet Commonly known as: Levaquin Take 1 tablet (750 mg total) by mouth every other day for 2 doses.   metoprolol succinate 50 MG 24 hr tablet Commonly known as: TOPROL-XL Take 1 tablet (50 mg total) by mouth at bedtime. Take with or immediately following a meal.   montelukast 10 MG tablet Commonly known as: SINGULAIR Take 1 tablet by mouth at bedtime.   Multi-Vitamin tablet Take 1 tablet by mouth daily.   nystatin powder Commonly known as: MYCOSTATIN/NYSTOP Apply 1 Application topically 2 (two) times daily.   ondansetron 4 MG tablet Commonly known as: Zofran Take 1 tablet (4 mg total) by mouth every 8 (eight) hours as needed for nausea or vomiting.   tamsulosin 0.4 MG Caps capsule Commonly known as: FLOMAX Take 1 capsule (0.4 mg total) by mouth daily.   torsemide 20 MG tablet Commonly known as: DEMADEX Take 20 mg by mouth daily as needed (fluid).               Durable Medical Equipment  (From admission, onward)           Start     Ordered   08/13/22 1005  For home use only DME Other see comment  Once       Comments: Gel overlay mattress  Question:  Length of Need  Answer:  Lifetime   08/13/22 1005              Discharge Care Instructions  (From admission, onward)           Start     Ordered   08/13/22 0000  Discharge wound care:       Comments: Frequent changing of positions.   08/13/22 1006            Follow-up Information     [facility] Follow up.   Specialty: [place] Why: someone at [facility] will call to schedule home health visits Contact information: [place] [address] [phone]                Discharge Exam: Danley Danker Weights   08/12/22 0300  Weight: 78.2 kg   Today's Vitals   08/12/22 2200 08/12/22 2335 08/13/22 0451 08/13/22 0934  BP:   136/71 (!) 145/62  Pulse:   60 (!) 59  Resp:   17 18   Temp:   99.5 F (37.5 C) 98.4 F (36.9 C)  TempSrc:   Oral Oral  SpO2:   96% 97%  Weight:      PainSc: 0-No pain 0-No pain     Body mass index is 21.26 kg/m.   General:  Average built, not in obvious distress alert awake HENT:   No scleral pallor or icterus noted. Oral mucosa is moist.  Chest:  Clear breath sounds.  Diminished breath sounds bilaterally. No crackles or wheezes.  CVS: S1 &S2 heard. No murmur.  Regular rate and rhythm. Abdomen: Soft, nontender, nondistended.  Bowel sounds are heard.   Extremities: No cyanosis, clubbing bilateral lower extremity edema,  Peripheral pulses are palpable. Psych: Alert, awake and oriented, normal mood CNS:  .  Left sided weakness from previous CVA Skin: Warm and dry.  Pressure ulceration stage II present on admission.   Condition at discharge: good  The results of significant diagnostics from this hospitalization (including imaging, microbiology, ancillary and laboratory) are listed below for reference.   Imaging Studies: VAS [place] LOWER EXTREMITY VENOUS (DVT)  Result Date: 08/12/2022  Lower Venous DVT Study Patient Name:  TALLON GERTZ  Date of Exam:   08/11/2022 Medical Rec #: [id number]      Accession #:    [id number] Date of Birth: 10/19/1950      Patient Gender: M Patient Age:   32 years Exam Location:  [facility] Procedure:      VAS [place] LOWER EXTREMITY VENOUS (DVT) Referring Phys: Cheri Rous COUNTRYMAN --------------------------------------------------------------------------------  Indications: Swelling.  Risk Factors: DVT. Limitations: Poor ultrasound/tissue interface and patient positioning. Comparison Study: 05/17/2022 - RIGHT:                   - Findings consistent with acute deep vein thrombosis                   involving the right                   common femoral vein, right femoral vein, right proximal                   profunda vein,                   right popliteal vein, right posterior tibial veins, and right                    peroneal                   veins.                    LEFT:                   - No evidence of deep vein thrombosis in the lower extremity.                   No indirect                   evidence of obstruction proximal to the inguinal ligament.                   - Wall thickening and diminutive vein size noted on the left. Performing Technologist: Oliver Hum RVT  Examination Guidelines: A complete evaluation includes B-mode imaging, spectral Doppler, color Doppler, and power Doppler as needed of all accessible portions of each vessel. Bilateral testing is considered an integral part of a complete examination. Limited examinations for reoccurring indications may be performed as noted. The reflux portion of the exam is  performed with the patient in reverse Trendelenburg.  +---------+---------------+---------+-----------+----------+-------------------+ RIGHT    CompressibilityPhasicitySpontaneityPropertiesThrombus Aging      +---------+---------------+---------+-----------+----------+-------------------+ CFV      Full           Yes      Yes                                      +---------+---------------+---------+-----------+----------+-------------------+ SFJ      Full                                                             +---------+---------------+---------+-----------+----------+-------------------+ FV Prox  Full                                                             +---------+---------------+---------+-----------+----------+-------------------+ FV Mid   Partial        Yes      Yes                  Chronic             +---------+---------------+---------+-----------+----------+-------------------+ FV DistalPartial        Yes      Yes                  Chronic             +---------+---------------+---------+-----------+----------+-------------------+ PFV      Full                                                              +---------+---------------+---------+-----------+----------+-------------------+ POP      Partial        Yes      Yes                  Chronic             +---------+---------------+---------+-----------+----------+-------------------+ PTV      Full                                                             +---------+---------------+---------+-----------+----------+-------------------+ PERO                                                  Not well visualized +---------+---------------+---------+-----------+----------+-------------------+   +----+---------------+---------+-----------+----------+--------------+ LEFTCompressibilityPhasicitySpontaneityPropertiesThrombus Aging +----+---------------+---------+-----------+----------+--------------+ CFV Full           Yes      Yes                                 +----+---------------+---------+-----------+----------+--------------+  Summary: RIGHT: - Findings consistent with chronic deep vein thrombosis involving the right femoral vein, and right popliteal vein. - No cystic structure found in the popliteal fossa.  LEFT: - No evidence of common femoral vein obstruction.  *See table(s) above for measurements and observations. Electronically signed by Deitra Mayo MD on 08/12/2022 at 6:26:03 AM.    Final    DG Chest 2 View  Result Date: 08/11/2022 CLINICAL DATA:  Cough and fever.  Right leg swelling and warmth. EXAM: CHEST - 2 VIEW COMPARISON:  Chest radiographs 07/05/2022 FINDINGS: A dual lead pacemaker remains in place. The cardiomediastinal silhouette is unchanged with normal heart size. Lung volumes are lower than on the prior study, and there is increased patchy opacity in the left lung base. Minimal atelectasis is noted in the right lung base. No pleural effusion or pneumothorax is identified. IMPRESSION: Low lung volumes with worsening left basilar opacity which could reflect pneumonia. Electronically Signed   By:  Logan Bores M.D.   On: 08/11/2022 13:49    Microbiology: Results for orders placed or performed during the hospital encounter of 08/11/22  Resp Panel by RT-PCR (Flu A&B, Covid) Anterior Nasal Swab     Status: None   Collection Time: 08/11/22  2:36 PM   Specimen: Anterior Nasal Swab  Result Value Ref Range Status   SARS Coronavirus 2 by RT PCR NEGATIVE NEGATIVE Final    Comment: (NOTE) SARS-CoV-2 target nucleic acids are NOT DETECTED.  The SARS-CoV-2 RNA is generally detectable in upper respiratory specimens during the acute phase of infection. The lowest concentration of SARS-CoV-2 viral copies this assay can detect is 138 copies/mL. A negative result does not preclude SARS-Cov-2 infection and should not be used as the sole basis for treatment or other patient management decisions. A negative result may occur with  improper specimen collection/handling, submission of specimen other than nasopharyngeal swab, presence of viral mutation(s) within the areas targeted by this assay, and inadequate number of viral copies(<138 copies/mL). A negative result must be combined with clinical observations, patient history, and epidemiological information. The expected result is Negative.  Fact Sheet for Patients:  EntrepreneurPulse.com.au  Fact Sheet for Healthcare Providers:  IncredibleEmployment.be  This test is no t yet approved or cleared by the [place] FDA and  has been authorized for detection and/or diagnosis of SARS-CoV-2 by FDA under an Emergency Use Authorization (EUA). This EUA will remain  in effect (meaning this test can be used) for the duration of the COVID-19 declaration under Section 564(b)(1) of the Act, 21 U.S.C.section 360bbb-3(b)(1), unless the authorization is terminated  or revoked sooner.       Influenza A by PCR NEGATIVE NEGATIVE Final   Influenza B by PCR NEGATIVE NEGATIVE Final    Comment: (NOTE) The Xpert Xpress  SARS-CoV-2/FLU/RSV plus assay is intended as an aid in the diagnosis of influenza from Nasopharyngeal swab specimens and should not be used as a sole basis for treatment. Nasal washings and aspirates are unacceptable for Xpert Xpress SARS-CoV-2/FLU/RSV testing.  Fact Sheet for Patients: EntrepreneurPulse.com.au  Fact Sheet for Healthcare Providers: IncredibleEmployment.be  This test is not yet approved or cleared by the [place] FDA and has been authorized for detection and/or diagnosis of SARS-CoV-2 by FDA under an Emergency Use Authorization (EUA). This EUA will remain in effect (meaning this test can be used) for the duration of the COVID-19 declaration under Section 564(b)(1) of the Act, 21 U.S.C. section 360bbb-3(b)(1), unless the authorization is terminated or revoked.  Performed  at [facility], [place] [address] [place], [place] [address]   Culture, Urine (Do not remove urinary catheter, catheter placed by urology or difficult to place)     Status: None   Collection Time: 08/11/22  6:45 PM   Specimen: Urine, Catheterized  Result Value Ref Range Status   Specimen Description URINE, CATHETERIZED  Final   Special Requests NONE  Final   Culture   Final    NO GROWTH Performed at [facility] [address], [place], [place] [address]    Report Status 08/12/2022 FINAL  Final  Blood culture (routine x 2)     Status: None (Preliminary result)   Collection Time: 08/11/22  8:14 PM   Specimen: BLOOD RIGHT FOREARM  Result Value Ref Range Status   Specimen Description BLOOD RIGHT FOREARM  Final   Special Requests   Final    BOTTLES DRAWN AEROBIC AND ANAEROBIC Blood Culture adequate volume   Culture   Final    NO GROWTH 2 DAYS Performed at [facility], [place] [address] [place], [place] [address]    Report Status PENDING  Incomplete  Blood culture (routine x 2)     Status: None (Preliminary result)   Collection Time:  08/11/22  9:52 PM   Specimen: BLOOD RIGHT HAND  Result Value Ref Range Status   Specimen Description BLOOD RIGHT HAND  Final   Special Requests   Final    BOTTLES DRAWN AEROBIC AND ANAEROBIC Blood Culture adequate volume   Culture   Final    NO GROWTH 2 DAYS Performed at [facility], [place] [address], [place], [place] [address]    Report Status PENDING  Incomplete  Culture, blood (single) w Reflex to ID Panel     Status: None (Preliminary result)   Collection Time: 08/11/22 11:46 PM   Specimen: BLOOD  Result Value Ref Range Status   Specimen Description BLOOD RIGHT ANTECUBITAL  Final   Special Requests   Final    BOTTLES DRAWN AEROBIC AND ANAEROBIC Blood Culture results may not be optimal due to an inadequate volume of blood received in culture bottles   Culture   Final    NO GROWTH 2 DAYS Performed at [facility], [place] [address], [place],  [address]    Report Status PENDING  Incomplete    Labs: CBC: Recent Labs  Lab 08/11/22 1433 08/12/22 0508  WBC 6.9 5.9  NEUTROABS 3.6  --   HGB 10.4* 9.0*  HCT 33.0* 27.5*  MCV 91.9 89.6  PLT 200 678   Basic Metabolic Panel: Recent Labs  Lab 08/11/22 1433 08/12/22 0508  NA 141 139  K 4.8 5.0  CL 115* 113*  CO2 21* 20*  GLUCOSE 100* 103*  BUN 57* 53*  CREATININE 2.89* 2.57*  CALCIUM 9.4 9.3   Liver Function Tests: No results for input(s): "AST", "ALT", "ALKPHOS", "BILITOT", "PROT", "ALBUMIN" in the last 168 hours. CBG: No results for input(s): "GLUCAP" in the last 168 hours.  Discharge time spent: greater than 30 minutes.  Signed: Flora Lipps, MD [facility] 08/13/2022

## 2022-08-13 NOTE — Progress Notes (Signed)
    Durable Medical Equipment  (From admission, onward)           Start     Ordered   08/13/22 0955  For home use only DME Hospital bed  Once       Question Answer Comment  Length of Need Lifetime   Patient has (list medical condition): ALS, stage 2 pressure injury to buttocks   The above medical condition requires: Patient requires the ability to reposition frequently   Head must be elevated greater than: 30 degrees   Bed type Semi-electric   Hoyer Lift Yes   Support Surface: Gel Overlay      08/13/22 0955

## 2022-08-13 NOTE — TOC Transition Note (Signed)
Transition of Care [facility]) - CM/SW Discharge Note   Patient Details  Name: Darrell [name] MRN: [id number] Date of Birth: 1951/08/30  [facility] CM/SW Contact:  Bartholomew Crews, RN Phone Number: [phone] 08/13/2022, 12:33 PM   Clinical Narrative:     Patient to transition home today. Confirmed delivery of gel overlay mattress. Spoke with wife and confirmed home address. PTAR arranged. Medical necessity form completed and printed to nurses station. [place] liaison notified of dc today. No further TOC needs identified at this time.   Final next level of care: [place] Barriers to Discharge: No Barriers Identified   Patient Goals and CMS Choice Patient states their goals for this hospitalization and ongoing recovery are:: return home with family care CMS Medicare.gov Compare Post Acute Care list provided to:: Patient Represenative (must comment) Choice offered to / list presented to : Spouse  Discharge Placement                       Discharge Plan and Services In-house Referral: Interpreting Services Discharge Planning Services: CM Consult Post Acute Care Choice: Durable Medical Equipment          DME Arranged: Specialty mattress (gel overlay) DME Agency: [employer] Date DME Agency Contacted: 08/13/22 Time DME Agency Contacted: 1478 Representative spoke with at DME Agency: [place]: PT, RN [place] Agency: [place] Date [place]: 08/13/22 Time [place]: 1232 Representative spoke with at [place]: [place]) Interventions     Readmission Risk Interventions    05/19/2022    3:24 PM  Readmission Risk Prevention Plan  Transportation Screening Complete  Medication Review Press photographer) Complete  HRI or [place] Complete  [place] Not Applicable

## 2022-08-13 NOTE — Progress Notes (Signed)
Darrell [name] to be D/C'd Home per MD order.  Discussed with the patient and all questions fully answered.  VSS, Skin clean, dry and intact without evidence of skin break down, no evidence of skin tears noted. IV catheter discontinued intact. Site without signs and symptoms of complications. Dressing and pressure applied.  An After Visit Summary was printed and given to the patient's caregiver. Patient's  prescription sent to patient's pharmacy.  D/c education completed with patient/family including follow up instructions, medication list, d/c activities limitations if indicated, with other d/c instructions as indicated by MD - patient able to verbalize understanding, all questions fully answered.   Patient instructed to return to ED, call 911, or call MD for any changes in condition.   Patient escorted via stretcher, and D/C home via non-emergency ambulance.  Manuella Ghazi 08/13/2022 2:28 PM

## 2022-08-13 NOTE — Progress Notes (Signed)
Barrier cream and air mattress ordered for stage 2 on buttock. Mattress will not arrive till Monday

## 2022-08-13 NOTE — TOC Initial Note (Signed)
Transition of Care [facility]) - Initial/Assessment Note    Patient Details  Name: Darrell [name] MRN: [id number] Date of Birth: 1951/05/29  Transition of Care [facility]) CM/SW Contact:    Bartholomew Crews, RN Phone Number: [phone] 08/13/2022, 10:29 AM  Clinical Narrative:                  Spoke with patient's spouse on her cell phone to discuss post acute transition. Patient currently has hospital bed. Confirmed with Jasmine at [facility] that he received hospital bed with standard mattress 06/07/2020. DME order placed for mattress to be replaced with gel overlay d/t patient having stage 2 pressure injuries. Anticipate mattress delivery either today or tomorrow. Spouse cares for patient at home with assistance from other family members. Confirmed that patient is active with [facility] for RN, PT - patient will need HH orders. Patient will require ambulance transport at discharge. TOC to follow for transition needs.   Expected Discharge Plan: Home/Self Care Barriers to Discharge: Continued Medical Work up   Patient Goals and CMS Choice Patient states their goals for this hospitalization and ongoing recovery are:: return home with family care CMS Medicare.gov Compare Post Acute Care list provided to:: Patient Represenative (must comment) Choice offered to / list presented to : Spouse  Expected Discharge Plan and Services Expected Discharge Plan: Home/Self Care In-house Referral: Interpreting Services Discharge Planning Services: CM Consult Post Acute Care Choice: Durable Medical Equipment Living arrangements for the past 2 months: Single Family Home Expected Discharge Date: 08/13/22               DME Arranged: Specialty mattress (gel overlay) DME Agency: [employer] Date DME Agency Contacted: 08/13/22 Time DME Agency Contacted: 55 Representative spoke with at DME Agency: [place]: PT, RN [place] Agency: [place] Date [place]: 08/13/22 Time [place]: 59 Representative spoke with at [place]: Alwyn Ren - currently active for nursing and PT  Prior Living Arrangements/Services Living arrangements for the past 2 months: [place] with:: Spouse, Self          Need for Family Participation in Patient Care: Yes (Comment) Care giver support system in place?: Yes (comment) Current home services: DME Criminal Activity/Legal Involvement Pertinent to Current Situation/Hospitalization: No - Comment as needed  Activities of Daily Living      Permission Sought/Granted                  Emotional Assessment         Alcohol / Substance Use: Not Applicable Psych Involvement: No (comment)  Admission diagnosis:  Aspiration pneumonia ([place]) [J69.0] SOB (shortness of breath) [R06.02] Pneumonia of left lower lobe due to infectious organism [J18.9] Patient Active Problem List   Diagnosis Date Noted   Chronic indwelling Foley catheter 08/11/2022   Diarrhea 08/11/2022   Chronic anticoagulation 08/11/2022   Aspiration pneumonia ([place]) 07/06/2022   Urinary retention 07/06/2022   Catheter-associated urinary tract infection ([place]) 07/06/2022   Chronic deep vein thrombosis (DVT) ([place]) 05/17/2022   PNA (pneumonia) 05/17/2022   DNR (do not resuscitate) 04/13/2022   Community acquired bacterial pneumonia 03/23/2022   Acute respiratory failure with hypoxia ([place]) 10/10/2021   Coffee ground emesis 10/07/2021   Hyperkalemia 10/07/2021   Normocytic anemia 10/07/2021   Bigeminal rhythm 10/07/2021   Fecal impaction ([place])    Centrilobular emphysema ([place]) 54/27/0623   Acute metabolic encephalopathy 76/28/3151   COVID-19 virus infection 05/06/2021   Anemia, chronic disease 05/06/2021  Benign prostatic hyperplasia 05/06/2021   Ulcer of ankle ([place]) 03/29/2021   CKD (chronic kidney disease), stage IV ([place]) 03/09/2021   Cognitive communication deficit 01/10/2021   Chronic bronchitis ([place]) 05/25/2020   Chronic cough  05/25/2020   Goals of care, counseling/discussion    Palliative care by specialist    Acute kidney injury superimposed on chronic kidney disease ([place]) 05/05/2019   Pain due to onychomycosis of toenails of both feet 03/25/2019   Primary lateral sclerosis ([place]) 08/28/2018   ALS (amyotrophic lateral sclerosis) ([place]) 07/24/2018   Dysarthria 05/21/2018   Dysphagia 05/21/2018   Gait abnormality 05/21/2018   Spasticity 05/21/2018   GERD (gastroesophageal reflux disease) 07/21/2017   Mixed hyperlipidemia 07/21/2017   Bradycardia    Seizure disorder ([place]) 07/08/2017   Status cardiac pacemaker 01/29/2017   Unintended weight loss 01/29/2017   Tinea pedis of right foot 01/29/2017   Hypertensive retinopathy of both eyes 01/16/2017   Hypercalcemia 01/15/2017   Heart block 01/15/2017   Nuclear sclerosis of both eyes 10/25/2016   Primary open angle glaucoma of both eyes, indeterminate stage 10/25/2016   Glaucoma 09/12/2016   Liver hemangioma 08/14/2016   Renal cyst 08/14/2016   Renal mass, right 08/14/2016   Elevated liver enzymes 08/10/2016   Chronic kidney disease, stage 3b ([place]) 08/16/2015   At high risk for falls 08/16/2015   Subcutaneous nodules 08/16/2015   Prediabetes 10/07/2013   Inguinal hernia 03/25/2013   History of CVA with residual deficit 03/25/2013   Essential hypertension 10/09/2012   Tobacco abuse 10/09/2012   PCP:  [name], MD Pharmacy:   [facility] [place] [address] [place] [address] Phone: [phone] Fax: [phone]  [facility] [address] [name] [address] Phone: [phone] Fax: [phone]     Social Determinants of Health (SDOH) Interventions    Readmission Risk Interventions    05/19/2022    3:24 PM  Readmission Risk Prevention Plan  Transportation Screening Complete  Medication Review ([place]) Complete  HRI or [place] Complete  [place] Not Applicable

## 2022-08-14 NOTE — Telephone Encounter (Signed)
Requested Prescriptions  Pending Prescriptions Disp Refills   ELIQUIS 5 MG TABS tablet [Pharmacy Med Name: Eliquis 5 MG Oral Tablet] 60 tablet 0    Sig: TAKE 1 TABLET BY MOUTH TWICE DAILY UPON  COMPLETION  OF  STARTER  PACK     Hematology:  Anticoagulants - apixaban Failed - 08/12/2022  3:12 PM      Failed - HGB in normal range and within 360 days    Hemoglobin  Date Value Ref Range Status  08/12/2022 9.0 (L) 13.0 - 17.0 g/dL Final  10/27/2021 10.3 (L) 13.0 - 17.7 g/dL Final         Failed - HCT in normal range and within 360 days    HCT  Date Value Ref Range Status  08/12/2022 27.5 (L) 39.0 - 52.0 % Final   Hematocrit  Date Value Ref Range Status  10/27/2021 30.5 (L) 37.5 - 51.0 % Final         Failed - Cr in normal range and within 360 days    Creat  Date Value Ref Range Status  08/10/2016 1.45 (H) 0.70 - 1.25 mg/dL Final    Comment:      For patients > or = 70 years of age: The upper reference limit for Creatinine is approximately 13% higher for people identified as African-American.      Creatinine, Ser  Date Value Ref Range Status  08/12/2022 2.57 (H) 0.61 - 1.24 mg/dL Final   Creatinine, Urine  Date Value Ref Range Status  10/09/2021 60.09 mg/dL Final    Comment:    Performed at [facility] [address], [place], [place] [address]         Failed - AST in normal range and within 360 days    AST  Date Value Ref Range Status  07/07/2022 11 (L) 15 - 41 U/L Final         Passed - PLT in normal range and within 360 days    Platelets  Date Value Ref Range Status  08/12/2022 178 150 - 400 K/uL Final  10/27/2021 300 150 - 450 x10E3/uL Final         Passed - ALT in normal range and within 360 days    ALT  Date Value Ref Range Status  07/07/2022 14 0 - 44 U/L Final         Passed - Valid encounter within last 12 months    Recent Outpatient Visits           2 weeks ago Cough in adult patient   [place],  Enobong, MD   2 months ago Urinary retention   [place], Enobong, MD   6 months ago Need for pneumococcal vaccine   [place], Charlane Ferretti, MD   9 months ago CKD (chronic kidney disease), stage IV [facility])   [place], Enobong, MD   10 months ago Renal mass, right   [place], MD       Future Appointments             In 4 weeks [name], MD [place]   In 4 months Penumalli, Earlean Polka, MD [facility]

## 2022-08-16 ENCOUNTER — Telehealth: Payer: Self-pay | Admitting: Family Medicine

## 2022-08-16 ENCOUNTER — Ambulatory Visit: Payer: Self-pay

## 2022-08-16 LAB — CULTURE, BLOOD (SINGLE): Culture: NO GROWTH

## 2022-08-16 LAB — CULTURE, BLOOD (ROUTINE X 2)
Culture: NO GROWTH
Culture: NO GROWTH
Special Requests: ADEQUATE
Special Requests: ADEQUATE

## 2022-08-16 NOTE — Telephone Encounter (Unsigned)
Pts wife [name] is calling to request a hospital follow up. Pt has appt on 09/12/22. Is a follow up needed before 09/12/22 CB- [phone]

## 2022-08-16 NOTE — Patient Instructions (Addendum)
Visit Information  Thank you for taking time to visit with me today. Please don't hesitate to contact me if I can be of assistance to you.   Following are the goals we discussed today:   Goals Addressed             This Visit's Progress    I want to keep him healthy       Care Coordination Interventions: Evaluation of current treatment plan related to ALS  and patient's adherence to plan as established by provider Determined wife [name] did not hear from [place] and discussed recent admission to [facility]; 08/11/22-08/13/22 with chronic DVT  Review of patient status, including review of consultant's reports, relevant laboratory and other test results, and medications completed Determined patient received an air mattress overlay for his hospital bed Educated wife regarding skin breakdown prevention and signs/symptoms to report promptly  Sent in basket message to [place] regarding resource needs to help patient purchase Thick-It  Determined Sharyn Lull NP will make a home visit with patient this week for determination of transition to [facility] for primary care, Cecille Rubin will call Mrs. Kunzman to schedule this visit            Our next appointment is by telephone on 08/30/22 at 12 PM  Please call the care guide team at [phone] if you need to cancel or reschedule your appointment.   If you are experiencing a Mental Health or [place] or need someone to talk to, please call 1-800-273-TALK (toll free, 24 hour hotline)  Patient verbalizes understanding of instructions and care plan provided today and agrees to view in [place]. Active MyChart status and patient understanding of how to access instructions and care plan via MyChart confirmed with patient.     Barb Merino, RN, BSN, CCM Care Management Coordinator [facility] Care Management Direct Phone: [phone]

## 2022-08-16 NOTE — Patient Outreach (Addendum)
  Care Coordination   Follow Up Visit Note   08/16/2022 Name: Darrell [name] MRN: [id number] DOB: 1951-02-03  Darrell [name] is a 71 y.o. year old male who sees [name], MD for primary care. I spoke with wife Karan Ramnauth by phone today.  What matters to the patients health and wellness today?  Wife continues to adhere to MD recommendations to try to keep patient healthy. Patient would like to get established with [facility].     Goals Addressed             This Visit's Progress    I want to keep him healthy       Care Coordination Interventions: Evaluation of current treatment plan related to ALS  and patient's adherence to plan as established by provider Determined wife [name] did not hear from [place] and discussed recent admission to [facility]; 08/11/22-08/13/22 with chronic DVT  Review of patient status, including review of consultant's reports, relevant laboratory and other test results, and medications completed Determined patient received an air mattress overlay for his hospital bed Educated wife regarding skin breakdown prevention and signs/symptoms to report promptly  Sent in basket message to [place] regarding resource needs to help patient purchase Thick-It  Determined Sharyn Lull NP will make a home visit with patient this week for determination of transition to [facility] for primary care, Cecille Rubin will call Mrs. Owens Shark to schedule this visit            SDOH assessments and interventions completed:  No     Care Coordination Interventions:  Yes, provided   Follow up plan: Referral made to [facility]  Follow up call scheduled for 08/30/22 '@12'$  PM     Encounter Outcome:  Pt. Visit Completed

## 2022-08-18 DIAGNOSIS — N401 Enlarged prostate with lower urinary tract symptoms: Secondary | ICD-10-CM | POA: Diagnosis not present

## 2022-08-18 DIAGNOSIS — G1221 Amyotrophic lateral sclerosis: Secondary | ICD-10-CM | POA: Diagnosis not present

## 2022-08-18 DIAGNOSIS — J690 Pneumonitis due to inhalation of food and vomit: Secondary | ICD-10-CM | POA: Diagnosis not present

## 2022-08-18 DIAGNOSIS — G6181 Chronic inflammatory demyelinating polyneuritis: Secondary | ICD-10-CM | POA: Diagnosis not present

## 2022-08-18 DIAGNOSIS — F17210 Nicotine dependence, cigarettes, uncomplicated: Secondary | ICD-10-CM | POA: Diagnosis not present

## 2022-08-18 DIAGNOSIS — N184 Chronic kidney disease, stage 4 (severe): Secondary | ICD-10-CM | POA: Diagnosis not present

## 2022-08-18 DIAGNOSIS — H401134 Primary open-angle glaucoma, bilateral, indeterminate stage: Secondary | ICD-10-CM | POA: Diagnosis not present

## 2022-08-18 DIAGNOSIS — G8929 Other chronic pain: Secondary | ICD-10-CM | POA: Diagnosis not present

## 2022-08-18 DIAGNOSIS — Z435 Encounter for attention to cystostomy: Secondary | ICD-10-CM | POA: Diagnosis not present

## 2022-08-18 DIAGNOSIS — M1990 Unspecified osteoarthritis, unspecified site: Secondary | ICD-10-CM | POA: Diagnosis not present

## 2022-08-18 DIAGNOSIS — H35033 Hypertensive retinopathy, bilateral: Secondary | ICD-10-CM | POA: Diagnosis not present

## 2022-08-18 DIAGNOSIS — Z466 Encounter for fitting and adjustment of urinary device: Secondary | ICD-10-CM | POA: Diagnosis not present

## 2022-08-18 DIAGNOSIS — R338 Other retention of urine: Secondary | ICD-10-CM | POA: Diagnosis not present

## 2022-08-18 DIAGNOSIS — N39498 Other specified urinary incontinence: Secondary | ICD-10-CM | POA: Diagnosis not present

## 2022-08-18 DIAGNOSIS — D1803 Hemangioma of intra-abdominal structures: Secondary | ICD-10-CM | POA: Diagnosis not present

## 2022-08-18 DIAGNOSIS — I700 Atherosclerosis of aorta: Secondary | ICD-10-CM | POA: Diagnosis not present

## 2022-08-18 DIAGNOSIS — I69354 Hemiplegia and hemiparesis following cerebral infarction affecting left non-dominant side: Secondary | ICD-10-CM | POA: Diagnosis not present

## 2022-08-18 DIAGNOSIS — I82402 Acute embolism and thrombosis of unspecified deep veins of left lower extremity: Secondary | ICD-10-CM | POA: Diagnosis not present

## 2022-08-18 DIAGNOSIS — D631 Anemia in chronic kidney disease: Secondary | ICD-10-CM | POA: Diagnosis not present

## 2022-08-18 DIAGNOSIS — I69391 Dysphagia following cerebral infarction: Secondary | ICD-10-CM | POA: Diagnosis not present

## 2022-08-18 DIAGNOSIS — J449 Chronic obstructive pulmonary disease, unspecified: Secondary | ICD-10-CM | POA: Diagnosis not present

## 2022-08-18 DIAGNOSIS — I441 Atrioventricular block, second degree: Secondary | ICD-10-CM | POA: Diagnosis not present

## 2022-08-18 DIAGNOSIS — G40909 Epilepsy, unspecified, not intractable, without status epilepticus: Secondary | ICD-10-CM | POA: Diagnosis not present

## 2022-08-18 DIAGNOSIS — I129 Hypertensive chronic kidney disease with stage 1 through stage 4 chronic kidney disease, or unspecified chronic kidney disease: Secondary | ICD-10-CM | POA: Diagnosis not present

## 2022-08-18 DIAGNOSIS — N3289 Other specified disorders of bladder: Secondary | ICD-10-CM | POA: Diagnosis not present

## 2022-08-20 ENCOUNTER — Other Ambulatory Visit: Payer: Self-pay | Admitting: Cardiology

## 2022-08-21 ENCOUNTER — Ambulatory Visit: Payer: Self-pay | Admitting: *Deleted

## 2022-08-21 NOTE — Telephone Encounter (Signed)
Routing to PCP for review.

## 2022-08-21 NOTE — Telephone Encounter (Signed)
This message from [facility] was directly forwarded to [facility] for Dr. [name] as orders are needed.

## 2022-08-21 NOTE — Telephone Encounter (Signed)
Message from [place] sent at 08/21/2022 11:40 AM EST  Summary: [facility] wanting to provide detailed plan of care/ pt needs asap   Pls call [place] Olin Hauser) She is wanting to place verbal orders as pt has just gotten out of hospital She is wanting to giving detail instructions with multiple plans of care. She stated skilled nursing 1 wk 3 but then had 2 more months of details on the Plan of cCare what they would be achieving for each. She stated that he had been with them for quite some time but her first time calling in so not familiar with the process. She seems to want to be giving much more detail than agent is able to document properly.  She is wanting to submit 3 months of what they plan to achieve. Not sure how to assist. She said this needs to be handled quickly as in is much need for service now. Her # is [phone] [facility]          Call History   Type Contact Phone/Fax User  08/21/2022 11:31 AM EST Phone (Incoming)   Sharlet [place], [facility] Olin Hauser) [phone]

## 2022-08-22 ENCOUNTER — Other Ambulatory Visit: Payer: Self-pay

## 2022-08-22 MED ORDER — METOPROLOL SUCCINATE ER 50 MG PO TB24: 50.0000 mg | ORAL_TABLET | Freq: Every day | ORAL | 2 refills | Status: DC

## 2022-08-22 NOTE — Telephone Encounter (Signed)
Verbal orders given for patient.

## 2022-08-22 NOTE — Telephone Encounter (Signed)
Okay to give verbal orders.  [facility] can proceed with what is needed for this patient as he does have ALS and will need highly skilled care.  Thank you.

## 2022-08-23 ENCOUNTER — Telehealth: Payer: Self-pay | Admitting: Family Medicine

## 2022-08-23 DIAGNOSIS — H35033 Hypertensive retinopathy, bilateral: Secondary | ICD-10-CM | POA: Diagnosis not present

## 2022-08-23 DIAGNOSIS — R338 Other retention of urine: Secondary | ICD-10-CM | POA: Diagnosis not present

## 2022-08-23 DIAGNOSIS — I700 Atherosclerosis of aorta: Secondary | ICD-10-CM | POA: Diagnosis not present

## 2022-08-23 DIAGNOSIS — I129 Hypertensive chronic kidney disease with stage 1 through stage 4 chronic kidney disease, or unspecified chronic kidney disease: Secondary | ICD-10-CM | POA: Diagnosis not present

## 2022-08-23 DIAGNOSIS — G1221 Amyotrophic lateral sclerosis: Secondary | ICD-10-CM | POA: Diagnosis not present

## 2022-08-23 DIAGNOSIS — N401 Enlarged prostate with lower urinary tract symptoms: Secondary | ICD-10-CM | POA: Diagnosis not present

## 2022-08-23 DIAGNOSIS — G40909 Epilepsy, unspecified, not intractable, without status epilepticus: Secondary | ICD-10-CM | POA: Diagnosis not present

## 2022-08-23 DIAGNOSIS — I69391 Dysphagia following cerebral infarction: Secondary | ICD-10-CM | POA: Diagnosis not present

## 2022-08-23 DIAGNOSIS — J690 Pneumonitis due to inhalation of food and vomit: Secondary | ICD-10-CM | POA: Diagnosis not present

## 2022-08-23 DIAGNOSIS — Z466 Encounter for fitting and adjustment of urinary device: Secondary | ICD-10-CM | POA: Diagnosis not present

## 2022-08-23 DIAGNOSIS — I69354 Hemiplegia and hemiparesis following cerebral infarction affecting left non-dominant side: Secondary | ICD-10-CM | POA: Diagnosis not present

## 2022-08-23 DIAGNOSIS — I82402 Acute embolism and thrombosis of unspecified deep veins of left lower extremity: Secondary | ICD-10-CM | POA: Diagnosis not present

## 2022-08-23 DIAGNOSIS — F17210 Nicotine dependence, cigarettes, uncomplicated: Secondary | ICD-10-CM | POA: Diagnosis not present

## 2022-08-23 DIAGNOSIS — M1990 Unspecified osteoarthritis, unspecified site: Secondary | ICD-10-CM | POA: Diagnosis not present

## 2022-08-23 DIAGNOSIS — N184 Chronic kidney disease, stage 4 (severe): Secondary | ICD-10-CM | POA: Diagnosis not present

## 2022-08-23 DIAGNOSIS — Z435 Encounter for attention to cystostomy: Secondary | ICD-10-CM | POA: Diagnosis not present

## 2022-08-23 DIAGNOSIS — N3289 Other specified disorders of bladder: Secondary | ICD-10-CM | POA: Diagnosis not present

## 2022-08-23 DIAGNOSIS — N39498 Other specified urinary incontinence: Secondary | ICD-10-CM | POA: Diagnosis not present

## 2022-08-23 DIAGNOSIS — H401134 Primary open-angle glaucoma, bilateral, indeterminate stage: Secondary | ICD-10-CM | POA: Diagnosis not present

## 2022-08-23 DIAGNOSIS — J449 Chronic obstructive pulmonary disease, unspecified: Secondary | ICD-10-CM | POA: Diagnosis not present

## 2022-08-23 DIAGNOSIS — I441 Atrioventricular block, second degree: Secondary | ICD-10-CM | POA: Diagnosis not present

## 2022-08-23 DIAGNOSIS — G6181 Chronic inflammatory demyelinating polyneuritis: Secondary | ICD-10-CM | POA: Diagnosis not present

## 2022-08-23 DIAGNOSIS — D631 Anemia in chronic kidney disease: Secondary | ICD-10-CM | POA: Diagnosis not present

## 2022-08-23 DIAGNOSIS — D1803 Hemangioma of intra-abdominal structures: Secondary | ICD-10-CM | POA: Diagnosis not present

## 2022-08-23 NOTE — Telephone Encounter (Signed)
Left message, verbal order on [name]'s secure voicemail with [facility].

## 2022-08-23 NOTE — Telephone Encounter (Signed)
Darrell [name] with [facility] is calling in to request verbal orders.   Frequency: 1 week 8  Phone: [phone]- secure voicemail.

## 2022-08-24 ENCOUNTER — Ambulatory Visit: Payer: Self-pay

## 2022-08-24 NOTE — Patient Outreach (Signed)
  Care Coordination   Follow Up Visit Note   08/24/2022 Name: Darrell [name] MRN: [id number] DOB: 1951-04-17  Darrell [name] is a 71 y.o. year old male who sees [name], MD for primary care. I  spoke with patients spouse and caregiver Darrell [name] by phone.  What matters to the patients health and wellness today?  Follow up on status of SCAT    Goals Addressed             This Visit's Progress    Care Coordination Activities       Care Coordination Interventions: Determined the patient has yet to receive information in the mail from [place] regarding rider eligibility Collaboration with Almedia Balls with SCAT to request feedback on patients application status Discussed the patient currently uses Simply Thick Easy Mix Honey Thick Gel which is very costly Assisted the patients spouse and caregiver with calling the patients health plan to determine if this is covered Spoke with pharmacist [place] who indicates it is not covered by health plan but indicates if the patient picks this up from the pharmacy versus OTC it would be cheaper Collaboration with patients primary care provider requesting a prescription be sent to [facility] on [address] will follow up over the next week to follow up on status of SCAT application         SDOH assessments and interventions completed:  No     Care Coordination Interventions:  Yes, provided   Follow up plan: Follow up call scheduled for 12/7    Encounter Outcome:  Pt. Visit Completed   Daneen Schick, [place], CDP Social Worker, Certified Dementia Practitioner [place] [facility]  Care Coordination [phone]

## 2022-08-24 NOTE — Patient Instructions (Signed)
Visit Information  Thank you for taking time to visit with me today. Please don't hesitate to contact me if I can be of assistance to you.   Following are the goals we discussed today:   Goals Addressed             This Visit's Progress    Care Coordination Activities       Care Coordination Interventions: Determined the patient has yet to receive information in the mail from [place] regarding rider eligibility Collaboration with Almedia Balls with SCAT to request feedback on patients application status Discussed the patient currently uses Simply Thick Easy Mix Honey Thick Gel which is very costly Assisted the patients spouse and caregiver with calling the patients health plan to determine if this is covered Spoke with pharmacist [place] who indicates it is not covered by health plan but indicates if the patient picks this up from the pharmacy versus OTC it would be cheaper Collaboration with patients primary care provider requesting a prescription be sent to [facility] on [address] will follow up over the next week to follow up on status of SCAT application         Our next appointment is by telephone on 12/7 at 1:00   Please call the care guide team at [phone] if you need to cancel or reschedule your appointment.   If you are experiencing a Mental Health or [place] or need someone to talk to, please call 1-800-273-TALK (toll free, 24 hour hotline)  Patient verbalizes understanding of instructions and care plan provided today and agrees to view in [place]. Active MyChart status and patient understanding of how to access instructions and care plan via MyChart confirmed with patient.     Telephone follow up appointment with care management team member scheduled for:12/7  Daneen Schick, [place], CDP Social Worker, Certified Dementia Practitioner [place] Coordination [phone]

## 2022-08-25 ENCOUNTER — Other Ambulatory Visit: Payer: Self-pay | Admitting: Family Medicine

## 2022-08-25 ENCOUNTER — Encounter: Payer: Self-pay | Admitting: Family Medicine

## 2022-08-25 DIAGNOSIS — I441 Atrioventricular block, second degree: Secondary | ICD-10-CM | POA: Diagnosis not present

## 2022-08-25 DIAGNOSIS — J690 Pneumonitis due to inhalation of food and vomit: Secondary | ICD-10-CM | POA: Diagnosis not present

## 2022-08-25 DIAGNOSIS — N3289 Other specified disorders of bladder: Secondary | ICD-10-CM | POA: Diagnosis not present

## 2022-08-25 DIAGNOSIS — N401 Enlarged prostate with lower urinary tract symptoms: Secondary | ICD-10-CM | POA: Diagnosis not present

## 2022-08-25 DIAGNOSIS — F17210 Nicotine dependence, cigarettes, uncomplicated: Secondary | ICD-10-CM | POA: Diagnosis not present

## 2022-08-25 DIAGNOSIS — I82402 Acute embolism and thrombosis of unspecified deep veins of left lower extremity: Secondary | ICD-10-CM | POA: Diagnosis not present

## 2022-08-25 DIAGNOSIS — R338 Other retention of urine: Secondary | ICD-10-CM | POA: Diagnosis not present

## 2022-08-25 DIAGNOSIS — H401134 Primary open-angle glaucoma, bilateral, indeterminate stage: Secondary | ICD-10-CM | POA: Diagnosis not present

## 2022-08-25 DIAGNOSIS — I69354 Hemiplegia and hemiparesis following cerebral infarction affecting left non-dominant side: Secondary | ICD-10-CM | POA: Diagnosis not present

## 2022-08-25 DIAGNOSIS — G1221 Amyotrophic lateral sclerosis: Secondary | ICD-10-CM | POA: Diagnosis not present

## 2022-08-25 DIAGNOSIS — G6181 Chronic inflammatory demyelinating polyneuritis: Secondary | ICD-10-CM | POA: Diagnosis not present

## 2022-08-25 DIAGNOSIS — N39498 Other specified urinary incontinence: Secondary | ICD-10-CM | POA: Diagnosis not present

## 2022-08-25 DIAGNOSIS — D1803 Hemangioma of intra-abdominal structures: Secondary | ICD-10-CM | POA: Diagnosis not present

## 2022-08-25 DIAGNOSIS — H35033 Hypertensive retinopathy, bilateral: Secondary | ICD-10-CM | POA: Diagnosis not present

## 2022-08-25 DIAGNOSIS — G40909 Epilepsy, unspecified, not intractable, without status epilepticus: Secondary | ICD-10-CM | POA: Diagnosis not present

## 2022-08-25 DIAGNOSIS — M1990 Unspecified osteoarthritis, unspecified site: Secondary | ICD-10-CM | POA: Diagnosis not present

## 2022-08-25 DIAGNOSIS — I69391 Dysphagia following cerebral infarction: Secondary | ICD-10-CM | POA: Diagnosis not present

## 2022-08-25 DIAGNOSIS — I700 Atherosclerosis of aorta: Secondary | ICD-10-CM | POA: Diagnosis not present

## 2022-08-25 DIAGNOSIS — Z435 Encounter for attention to cystostomy: Secondary | ICD-10-CM | POA: Diagnosis not present

## 2022-08-25 DIAGNOSIS — J449 Chronic obstructive pulmonary disease, unspecified: Secondary | ICD-10-CM | POA: Diagnosis not present

## 2022-08-25 DIAGNOSIS — D631 Anemia in chronic kidney disease: Secondary | ICD-10-CM | POA: Diagnosis not present

## 2022-08-25 DIAGNOSIS — I129 Hypertensive chronic kidney disease with stage 1 through stage 4 chronic kidney disease, or unspecified chronic kidney disease: Secondary | ICD-10-CM | POA: Diagnosis not present

## 2022-08-25 DIAGNOSIS — Z466 Encounter for fitting and adjustment of urinary device: Secondary | ICD-10-CM | POA: Diagnosis not present

## 2022-08-25 DIAGNOSIS — N184 Chronic kidney disease, stage 4 (severe): Secondary | ICD-10-CM | POA: Diagnosis not present

## 2022-08-25 MED ORDER — MISC. DEVICES MISC: 6 refills | Status: DC

## 2022-08-25 NOTE — Telephone Encounter (Signed)
Requested medications are due for refill today.  Unsure  Requested medications are on the active medications list.  yes  Last refill. 04/13/2022 unknown quantity  Future visit scheduled.   yes  Notes to clinic.  Medication listed as historical.    Requested Prescriptions  Pending Prescriptions Disp Refills   dicyclomine (BENTYL) 10 MG capsule [Pharmacy Med Name: Dicyclomine HCl 10 MG Oral Capsule] 90 capsule 0    Sig: TAKE 1 CAPSULE BY MOUTH ONCE DAILY AS NEEDED     Gastroenterology:  Antispasmodic Agents Passed - 08/25/2022  5:24 PM      Passed - Valid encounter within last 12 months    Recent Outpatient Visits           3 weeks ago Cough in adult patient   [place] [name], MD   2 months ago Urinary retention   [place], Enobong, MD   7 months ago Need for pneumococcal vaccine   [place], Enobong, MD   10 months ago CKD (chronic kidney disease), stage IV [facility])   [place], Enobong, MD   10 months ago Renal mass, right   [place], MD       Future Appointments             In 2 weeks [name], MD [place]   In 4 months Penumalli, Earlean Polka, MD [facility]

## 2022-08-30 ENCOUNTER — Telehealth: Payer: Self-pay | Admitting: Family Medicine

## 2022-08-30 ENCOUNTER — Ambulatory Visit: Payer: Self-pay

## 2022-08-30 NOTE — Patient Outreach (Signed)
  Care Coordination   Follow Up Visit Note   08/30/2022 Name: Darrell [name] MRN: [id number] DOB: [birth year]  Darrell [name] is a 71 y.o. year old male who sees [name], MD for primary care. I spoke with  [name] by phone today.  What matters to the patients health and wellness today?  Patient will complete an assessment for transition of primary care to [facility].     Goals Addressed             This Visit's Progress    I want to keep him healthy       Care Coordination Interventions: Evaluation of current treatment plan related to ALS  and patient's adherence to plan as established by provider Placed joint call with wife [name] and Reche Dixon RN with [place]  Discussed with wife [name] the availability of services for patient to receive his primary care through this provider Discussed with wife Joanell Rising. NP is available to make the initial home visit for patient to assess for transition of care on 09/07/22 11:15 AM-12:15 PM        SDOH assessments and interventions completed:  No     Care Coordination Interventions:  Yes, provided   Follow up plan: Follow up call scheduled for 09/08/22 '@0930'$  AM    Encounter Outcome:  Pt. Visit Completed

## 2022-08-30 NOTE — Telephone Encounter (Signed)
Copied from [place] [phone]. Topic: General - Other >> Aug 30, 2022  1:37 PM Ludger Nutting wrote: Patient's wife would like a callback from Dr. Margarita Rana to discuss in home care.

## 2022-08-30 NOTE — Telephone Encounter (Signed)
Wife will be switching him to in home care and she would like to personally talk with you. [phone]

## 2022-08-30 NOTE — Patient Instructions (Signed)
Visit Information  Thank you for taking time to visit with me today. Please don't hesitate to contact me if I can be of assistance to you.   Following are the goals we discussed today:   Goals Addressed             This Visit's Progress    I want to keep him healthy       Care Coordination Interventions: Evaluation of current treatment plan related to ALS  and patient's adherence to plan as established by provider Placed joint call with wife [name] and Reche Dixon RN with [place]  Discussed with wife [name] the availability of services for patient to receive his primary care through this provider Discussed with wife Joanell Rising. NP is available to make the initial home visit for patient to assess for transition of care on 09/07/22 11:15 AM-12:15 PM        Our next appointment is by telephone on 09/08/22 at 0930 AM  Please call the care guide team at [phone] if you need to cancel or reschedule your appointment.   If you are experiencing a Mental Health or [place] or need someone to talk to, please call 1-800-273-TALK (toll free, 24 hour hotline)  Patient verbalizes understanding of instructions and care plan provided today and agrees to view in [place]. Active MyChart status and patient understanding of how to access instructions and care plan via MyChart confirmed with patient.     Barb Merino, RN, BSN, CCM Care Management Coordinator [facility] Care Management Direct Phone: [phone]

## 2022-08-31 ENCOUNTER — Ambulatory Visit: Payer: Self-pay

## 2022-08-31 DIAGNOSIS — I700 Atherosclerosis of aorta: Secondary | ICD-10-CM | POA: Diagnosis not present

## 2022-08-31 DIAGNOSIS — H401134 Primary open-angle glaucoma, bilateral, indeterminate stage: Secondary | ICD-10-CM | POA: Diagnosis not present

## 2022-08-31 DIAGNOSIS — N184 Chronic kidney disease, stage 4 (severe): Secondary | ICD-10-CM | POA: Diagnosis not present

## 2022-08-31 DIAGNOSIS — J690 Pneumonitis due to inhalation of food and vomit: Secondary | ICD-10-CM | POA: Diagnosis not present

## 2022-08-31 DIAGNOSIS — G40909 Epilepsy, unspecified, not intractable, without status epilepticus: Secondary | ICD-10-CM | POA: Diagnosis not present

## 2022-08-31 DIAGNOSIS — N401 Enlarged prostate with lower urinary tract symptoms: Secondary | ICD-10-CM | POA: Diagnosis not present

## 2022-08-31 DIAGNOSIS — Z435 Encounter for attention to cystostomy: Secondary | ICD-10-CM | POA: Diagnosis not present

## 2022-08-31 DIAGNOSIS — R338 Other retention of urine: Secondary | ICD-10-CM | POA: Diagnosis not present

## 2022-08-31 DIAGNOSIS — F17210 Nicotine dependence, cigarettes, uncomplicated: Secondary | ICD-10-CM | POA: Diagnosis not present

## 2022-08-31 DIAGNOSIS — N39498 Other specified urinary incontinence: Secondary | ICD-10-CM | POA: Diagnosis not present

## 2022-08-31 DIAGNOSIS — M1990 Unspecified osteoarthritis, unspecified site: Secondary | ICD-10-CM | POA: Diagnosis not present

## 2022-08-31 DIAGNOSIS — G6181 Chronic inflammatory demyelinating polyneuritis: Secondary | ICD-10-CM | POA: Diagnosis not present

## 2022-08-31 DIAGNOSIS — I69391 Dysphagia following cerebral infarction: Secondary | ICD-10-CM | POA: Diagnosis not present

## 2022-08-31 DIAGNOSIS — D1803 Hemangioma of intra-abdominal structures: Secondary | ICD-10-CM | POA: Diagnosis not present

## 2022-08-31 DIAGNOSIS — I129 Hypertensive chronic kidney disease with stage 1 through stage 4 chronic kidney disease, or unspecified chronic kidney disease: Secondary | ICD-10-CM | POA: Diagnosis not present

## 2022-08-31 DIAGNOSIS — H35033 Hypertensive retinopathy, bilateral: Secondary | ICD-10-CM | POA: Diagnosis not present

## 2022-08-31 DIAGNOSIS — G1221 Amyotrophic lateral sclerosis: Secondary | ICD-10-CM | POA: Diagnosis not present

## 2022-08-31 DIAGNOSIS — N3289 Other specified disorders of bladder: Secondary | ICD-10-CM | POA: Diagnosis not present

## 2022-08-31 DIAGNOSIS — J449 Chronic obstructive pulmonary disease, unspecified: Secondary | ICD-10-CM | POA: Diagnosis not present

## 2022-08-31 DIAGNOSIS — I69354 Hemiplegia and hemiparesis following cerebral infarction affecting left non-dominant side: Secondary | ICD-10-CM | POA: Diagnosis not present

## 2022-08-31 DIAGNOSIS — I441 Atrioventricular block, second degree: Secondary | ICD-10-CM | POA: Diagnosis not present

## 2022-08-31 DIAGNOSIS — I82402 Acute embolism and thrombosis of unspecified deep veins of left lower extremity: Secondary | ICD-10-CM | POA: Diagnosis not present

## 2022-08-31 DIAGNOSIS — Z466 Encounter for fitting and adjustment of urinary device: Secondary | ICD-10-CM | POA: Diagnosis not present

## 2022-08-31 DIAGNOSIS — D631 Anemia in chronic kidney disease: Secondary | ICD-10-CM | POA: Diagnosis not present

## 2022-08-31 NOTE — Telephone Encounter (Signed)
Spouse informed me that the pain will be transitioning his care to [facility] health care as this will be easy for him since they will be making housecalls.  She thanked the team for the care that her husband has received so far.

## 2022-08-31 NOTE — Patient Outreach (Signed)
  Care Coordination   Collaboration  Visit Note   08/31/2022 Name: Darrell [name] MRN: [id number] DOB: 09/06/1951  Darrell [name] is a 71 y.o. year old male who sees [name], MD for primary care. I  attempted to collaborate with [place] regarding the status of patients application.  What matters to the patients health and wellness today?  Transportation Resources    Goals Addressed             This Visit's Progress    Care Coordination Activities       Care Coordination Interventions: Voice message left for [facility] requesting a return call on the status of patients application SW will follow up over the next 3 business days         SDOH assessments and interventions completed:  No     Care Coordination Interventions:  Yes, provided   Follow up plan:  SW will continue to follow    Encounter Outcome:  No Answer   Daneen Schick, BSW, CDP Social Worker, Certified Dementia Practitioner [facility] Care Management  Care Coordination [phone]

## 2022-08-31 NOTE — Telephone Encounter (Signed)
Patient's wife returned call back about home care for patient. Please call back

## 2022-09-01 DIAGNOSIS — R338 Other retention of urine: Secondary | ICD-10-CM | POA: Diagnosis not present

## 2022-09-01 DIAGNOSIS — N472 Paraphimosis: Secondary | ICD-10-CM | POA: Diagnosis not present

## 2022-09-04 ENCOUNTER — Ambulatory Visit: Payer: Self-pay

## 2022-09-04 NOTE — Patient Outreach (Signed)
  Care Coordination   Follow Up Visit Note   09/04/2022 Name: Darrell [name] MRN: [id number] DOB: March 25, 1951  Darrell [name] is a 71 y.o. year old male who sees [name], MD for primary care. I  spoke with patients spouse and caregiver Darrell [name] by phone.  What matters to the patients health and wellness today?  Transportation Resources    Goals Addressed             This Visit's Progress    COMPLETED: Care Coordination Activities       Care Coordination Interventions: Spoke with [facility] to confirm patient is eligible to access services for transportation Provided reservation number to [facility], advised how to access service Discussed plan for SW to mail SCAT brochure for Darrell [name] to review Determined the patient has plans to enroll with [facility] later in the week for primary care services due to ability to be seen in the home Collaboration with [facility] to advise of interventions and plan for SW to sign off at this time         SDOH assessments and interventions completed:  No     Care Coordination Interventions:  Yes, provided   Follow up plan: No further intervention required.   Encounter Outcome:  Pt. Visit Completed   Daneen Schick, BSW, CDP Social Worker, Certified Dementia Practitioner [facility]  Care Coordination [phone]

## 2022-09-04 NOTE — Patient Instructions (Signed)
Visit Information  Thank you for taking time to visit with me today. Please don't hesitate to contact me if I can be of assistance to you.   Following are the goals we discussed today:   Goals Addressed             This Visit's Progress    COMPLETED: Care Coordination Activities       Care Coordination Interventions: Spoke with [facility] to confirm patient is eligible to access services for transportation Provided reservation number to [facility], advised how to access service Discussed plan for SW to mail SCAT brochure for [name] to review Determined the patient has plans to enroll with [facility] later in the week for primary care services due to ability to be seen in the home Collaboration with [facility] to advise of interventions and plan for SW to sign off at this time         If you are experiencing a Mental Health or [place] or need someone to talk to, please call 1-800-273-TALK (toll free, 24 hour hotline)  Patient verbalizes understanding of instructions and care plan provided today and agrees to view in [facility]. Active MyChart status and patient understanding of how to access instructions and care plan via MyChart confirmed with patient.     No further follow up required: Please contact me as needed.  Daneen Schick, BSW, CDP Social Worker, Certified Dementia Practitioner [facility]  Care Coordination [phone]

## 2022-09-06 ENCOUNTER — Emergency Department (HOSPITAL_COMMUNITY): Payer: Medicare HMO

## 2022-09-06 ENCOUNTER — Inpatient Hospital Stay (HOSPITAL_COMMUNITY)
Admission: EM | Admit: 2022-09-06 | Discharge: 2022-09-16 | DRG: 177 | Disposition: A | Discharge status: Home Health | Payer: Medicare HMO | Attending: Internal Medicine | Admitting: Internal Medicine

## 2022-09-06 DIAGNOSIS — D62 Acute posthemorrhagic anemia: Secondary | ICD-10-CM | POA: Diagnosis not present

## 2022-09-06 DIAGNOSIS — K922 Gastrointestinal hemorrhage, unspecified: Secondary | ICD-10-CM | POA: Diagnosis not present

## 2022-09-06 DIAGNOSIS — Z1152 Encounter for screening for COVID-19: Secondary | ICD-10-CM | POA: Diagnosis not present

## 2022-09-06 DIAGNOSIS — J690 Pneumonitis due to inhalation of food and vomit: Principal | ICD-10-CM | POA: Diagnosis present

## 2022-09-06 DIAGNOSIS — R1310 Dysphagia, unspecified: Secondary | ICD-10-CM

## 2022-09-06 DIAGNOSIS — J984 Other disorders of lung: Secondary | ICD-10-CM | POA: Diagnosis present

## 2022-09-06 DIAGNOSIS — N400 Enlarged prostate without lower urinary tract symptoms: Secondary | ICD-10-CM | POA: Diagnosis present

## 2022-09-06 DIAGNOSIS — E870 Hyperosmolality and hypernatremia: Secondary | ICD-10-CM | POA: Diagnosis not present

## 2022-09-06 DIAGNOSIS — I129 Hypertensive chronic kidney disease with stage 1 through stage 4 chronic kidney disease, or unspecified chronic kidney disease: Secondary | ICD-10-CM | POA: Diagnosis present

## 2022-09-06 DIAGNOSIS — Z7901 Long term (current) use of anticoagulants: Secondary | ICD-10-CM | POA: Diagnosis not present

## 2022-09-06 DIAGNOSIS — D631 Anemia in chronic kidney disease: Secondary | ICD-10-CM | POA: Diagnosis not present

## 2022-09-06 DIAGNOSIS — G1221 Amyotrophic lateral sclerosis: Secondary | ICD-10-CM | POA: Diagnosis not present

## 2022-09-06 DIAGNOSIS — L89312 Pressure ulcer of right buttock, stage 2: Secondary | ICD-10-CM | POA: Diagnosis not present

## 2022-09-06 DIAGNOSIS — K6389 Other specified diseases of intestine: Secondary | ICD-10-CM | POA: Diagnosis not present

## 2022-09-06 DIAGNOSIS — J9601 Acute respiratory failure with hypoxia: Secondary | ICD-10-CM | POA: Diagnosis not present

## 2022-09-06 DIAGNOSIS — R0902 Hypoxemia: Secondary | ICD-10-CM

## 2022-09-06 DIAGNOSIS — N281 Cyst of kidney, acquired: Secondary | ICD-10-CM | POA: Diagnosis not present

## 2022-09-06 DIAGNOSIS — R112 Nausea with vomiting, unspecified: Secondary | ICD-10-CM | POA: Diagnosis not present

## 2022-09-06 DIAGNOSIS — J189 Pneumonia, unspecified organism: Secondary | ICD-10-CM | POA: Diagnosis not present

## 2022-09-06 DIAGNOSIS — K219 Gastro-esophageal reflux disease without esophagitis: Secondary | ICD-10-CM | POA: Diagnosis present

## 2022-09-06 DIAGNOSIS — H401134 Primary open-angle glaucoma, bilateral, indeterminate stage: Secondary | ICD-10-CM | POA: Diagnosis present

## 2022-09-06 DIAGNOSIS — Z7189 Other specified counseling: Secondary | ICD-10-CM | POA: Diagnosis not present

## 2022-09-06 DIAGNOSIS — D638 Anemia in other chronic diseases classified elsewhere: Secondary | ICD-10-CM | POA: Diagnosis present

## 2022-09-06 DIAGNOSIS — E7800 Pure hypercholesterolemia, unspecified: Secondary | ICD-10-CM | POA: Diagnosis not present

## 2022-09-06 DIAGNOSIS — R918 Other nonspecific abnormal finding of lung field: Secondary | ICD-10-CM | POA: Diagnosis not present

## 2022-09-06 DIAGNOSIS — Z825 Family history of asthma and other chronic lower respiratory diseases: Secondary | ICD-10-CM

## 2022-09-06 DIAGNOSIS — I82501 Chronic embolism and thrombosis of unspecified deep veins of right lower extremity: Secondary | ICD-10-CM

## 2022-09-06 DIAGNOSIS — I10 Essential (primary) hypertension: Secondary | ICD-10-CM | POA: Diagnosis not present

## 2022-09-06 DIAGNOSIS — F17210 Nicotine dependence, cigarettes, uncomplicated: Secondary | ICD-10-CM | POA: Diagnosis present

## 2022-09-06 DIAGNOSIS — R0602 Shortness of breath: Secondary | ICD-10-CM | POA: Diagnosis not present

## 2022-09-06 DIAGNOSIS — G40909 Epilepsy, unspecified, not intractable, without status epilepticus: Secondary | ICD-10-CM | POA: Diagnosis not present

## 2022-09-06 DIAGNOSIS — K311 Adult hypertrophic pyloric stenosis: Secondary | ICD-10-CM | POA: Diagnosis not present

## 2022-09-06 DIAGNOSIS — L8990 Pressure ulcer of unspecified site, unspecified stage: Secondary | ICD-10-CM | POA: Insufficient documentation

## 2022-09-06 DIAGNOSIS — K8020 Calculus of gallbladder without cholecystitis without obstruction: Secondary | ICD-10-CM | POA: Diagnosis not present

## 2022-09-06 DIAGNOSIS — J168 Pneumonia due to other specified infectious organisms: Secondary | ICD-10-CM | POA: Diagnosis not present

## 2022-09-06 DIAGNOSIS — N184 Chronic kidney disease, stage 4 (severe): Secondary | ICD-10-CM | POA: Diagnosis present

## 2022-09-06 DIAGNOSIS — Z7401 Bed confinement status: Secondary | ICD-10-CM | POA: Diagnosis not present

## 2022-09-06 DIAGNOSIS — I69354 Hemiplegia and hemiparesis following cerebral infarction affecting left non-dominant side: Secondary | ICD-10-CM

## 2022-09-06 DIAGNOSIS — I82509 Chronic embolism and thrombosis of unspecified deep veins of unspecified lower extremity: Secondary | ICD-10-CM | POA: Diagnosis present

## 2022-09-06 DIAGNOSIS — Z515 Encounter for palliative care: Secondary | ICD-10-CM

## 2022-09-06 DIAGNOSIS — Z79899 Other long term (current) drug therapy: Secondary | ICD-10-CM

## 2022-09-06 DIAGNOSIS — Z888 Allergy status to other drugs, medicaments and biological substances status: Secondary | ICD-10-CM

## 2022-09-06 DIAGNOSIS — Z4659 Encounter for fitting and adjustment of other gastrointestinal appliance and device: Secondary | ICD-10-CM | POA: Diagnosis not present

## 2022-09-06 DIAGNOSIS — Z833 Family history of diabetes mellitus: Secondary | ICD-10-CM

## 2022-09-06 DIAGNOSIS — I69391 Dysphagia following cerebral infarction: Secondary | ICD-10-CM | POA: Diagnosis not present

## 2022-09-06 DIAGNOSIS — N179 Acute kidney failure, unspecified: Secondary | ICD-10-CM | POA: Diagnosis not present

## 2022-09-06 DIAGNOSIS — B952 Enterococcus as the cause of diseases classified elsewhere: Secondary | ICD-10-CM | POA: Diagnosis present

## 2022-09-06 DIAGNOSIS — K3189 Other diseases of stomach and duodenum: Secondary | ICD-10-CM | POA: Diagnosis not present

## 2022-09-06 DIAGNOSIS — G6181 Chronic inflammatory demyelinating polyneuritis: Secondary | ICD-10-CM | POA: Diagnosis present

## 2022-09-06 DIAGNOSIS — Z8249 Family history of ischemic heart disease and other diseases of the circulatory system: Secondary | ICD-10-CM

## 2022-09-06 DIAGNOSIS — K7689 Other specified diseases of liver: Secondary | ICD-10-CM | POA: Diagnosis not present

## 2022-09-06 DIAGNOSIS — Z950 Presence of cardiac pacemaker: Secondary | ICD-10-CM

## 2022-09-06 DIAGNOSIS — J9811 Atelectasis: Secondary | ICD-10-CM | POA: Diagnosis present

## 2022-09-06 DIAGNOSIS — Z66 Do not resuscitate: Secondary | ICD-10-CM | POA: Diagnosis not present

## 2022-09-06 DIAGNOSIS — Z86718 Personal history of other venous thrombosis and embolism: Secondary | ICD-10-CM

## 2022-09-06 DIAGNOSIS — Z978 Presence of other specified devices: Secondary | ICD-10-CM

## 2022-09-06 DIAGNOSIS — Z91013 Allergy to seafood: Secondary | ICD-10-CM

## 2022-09-06 DIAGNOSIS — N189 Chronic kidney disease, unspecified: Secondary | ICD-10-CM

## 2022-09-06 DIAGNOSIS — I6930 Unspecified sequelae of cerebral infarction: Secondary | ICD-10-CM

## 2022-09-06 DIAGNOSIS — Z4682 Encounter for fitting and adjustment of non-vascular catheter: Secondary | ICD-10-CM | POA: Diagnosis not present

## 2022-09-06 DIAGNOSIS — R140 Abdominal distension (gaseous): Secondary | ICD-10-CM | POA: Diagnosis not present

## 2022-09-06 LAB — URINALYSIS, MICROSCOPIC (REFLEX): Squamous Epithelial / HPF: NONE SEEN (ref 0–5)

## 2022-09-06 LAB — CBC WITH DIFFERENTIAL/PLATELET
Abs Immature Granulocytes: 0.02 10*3/uL (ref 0.00–0.07)
Basophils Absolute: 0 10*3/uL (ref 0.0–0.1)
Basophils Relative: 0 %
Eosinophils Absolute: 0.4 10*3/uL (ref 0.0–0.5)
Eosinophils Relative: 6 %
HCT: 29.5 % — ABNORMAL LOW (ref 39.0–52.0)
Hemoglobin: 9.5 g/dL — ABNORMAL LOW (ref 13.0–17.0)
Immature Granulocytes: 0 %
Lymphocytes Relative: 20 %
Lymphs Abs: 1.3 10*3/uL (ref 0.7–4.0)
MCH: 29 pg (ref 26.0–34.0)
MCHC: 32.2 g/dL (ref 30.0–36.0)
MCV: 89.9 fL (ref 80.0–100.0)
Monocytes Absolute: 0.5 10*3/uL (ref 0.1–1.0)
Monocytes Relative: 8 %
Neutro Abs: 4.3 10*3/uL (ref 1.7–7.7)
Neutrophils Relative %: 66 %
Platelets: 195 10*3/uL (ref 150–400)
RBC: 3.28 MIL/uL — ABNORMAL LOW (ref 4.22–5.81)
RDW: 14.6 % (ref 11.5–15.5)
WBC: 6.5 10*3/uL (ref 4.0–10.5)
nRBC: 0 % (ref 0.0–0.2)

## 2022-09-06 LAB — RESP PANEL BY RT-PCR (RSV, FLU A&B, COVID)  RVPGX2
Influenza A by PCR: NEGATIVE
Influenza B by PCR: NEGATIVE
Resp Syncytial Virus by PCR: NEGATIVE
SARS Coronavirus 2 by RT PCR: NEGATIVE

## 2022-09-06 LAB — URINALYSIS, ROUTINE W REFLEX MICROSCOPIC
Bilirubin Urine: NEGATIVE
Glucose, UA: NEGATIVE mg/dL
Hgb urine dipstick: NEGATIVE
Ketones, ur: NEGATIVE mg/dL
Nitrite: NEGATIVE
Protein, ur: NEGATIVE mg/dL
Specific Gravity, Urine: 1.02 (ref 1.005–1.030)
pH: 5 (ref 5.0–8.0)

## 2022-09-06 LAB — COMPREHENSIVE METABOLIC PANEL
ALT: 15 U/L (ref 0–44)
AST: 14 U/L — ABNORMAL LOW (ref 15–41)
Albumin: 3.3 g/dL — ABNORMAL LOW (ref 3.5–5.0)
Alkaline Phosphatase: 71 U/L (ref 38–126)
Anion gap: 11 (ref 5–15)
BUN: 87 mg/dL — ABNORMAL HIGH (ref 8–23)
CO2: 23 mmol/L (ref 22–32)
Calcium: 9.6 mg/dL (ref 8.9–10.3)
Chloride: 104 mmol/L (ref 98–111)
Creatinine, Ser: 3.64 mg/dL — ABNORMAL HIGH (ref 0.61–1.24)
GFR, Estimated: 17 mL/min — ABNORMAL LOW (ref >60)
Glucose, Bld: 129 mg/dL — ABNORMAL HIGH (ref 70–99)
Potassium: 4.4 mmol/L (ref 3.5–5.1)
Sodium: 138 mmol/L (ref 135–145)
Total Bilirubin: 0.3 mg/dL (ref 0.3–1.2)
Total Protein: 6.2 g/dL — ABNORMAL LOW (ref 6.5–8.1)

## 2022-09-06 LAB — LIPASE, BLOOD: Lipase: 40 U/L (ref 11–51)

## 2022-09-06 LAB — PROCALCITONIN: Procalcitonin: 0.31 ng/mL

## 2022-09-06 LAB — LACTIC ACID, PLASMA: Lactic Acid, Venous: 0.9 mmol/L (ref 0.5–1.9)

## 2022-09-06 MED ORDER — SODIUM CHLORIDE 0.9 % IV SOLN
2.0000 g | INTRAVENOUS | Status: DC
  Administered 2022-09-07: 2 g via INTRAVENOUS
  Filled 2022-09-06: qty 20

## 2022-09-06 MED ORDER — ATORVASTATIN CALCIUM 40 MG PO TABS
40.0000 mg | ORAL_TABLET | Freq: Every day | ORAL | Status: DC
  Filled 2022-09-06 (×2): qty 1

## 2022-09-06 MED ORDER — LACTATED RINGERS IV BOLUS (SEPSIS)
1000.0000 mL | Freq: Once | INTRAVENOUS | Status: AC
  Administered 2022-09-06: 1000 mL via INTRAVENOUS

## 2022-09-06 MED ORDER — METOPROLOL SUCCINATE ER 25 MG PO TB24
50.0000 mg | ORAL_TABLET | Freq: Every day | ORAL | Status: DC
  Administered 2022-09-13 – 2022-09-15 (×3): 50 mg via ORAL
  Filled 2022-09-06 (×8): qty 2

## 2022-09-06 MED ORDER — LEVETIRACETAM 500 MG PO TABS: 500.0000 mg | ORAL_TABLET | Freq: Two times a day (BID) | ORAL | Status: DC

## 2022-09-06 MED ORDER — ACETAMINOPHEN 650 MG RE SUPP: 650.0000 mg | Freq: Four times a day (QID) | RECTAL | Status: DC | PRN

## 2022-09-06 MED ORDER — TAMSULOSIN HCL 0.4 MG PO CAPS
0.4000 mg | ORAL_CAPSULE | Freq: Every day | ORAL | Status: DC
  Administered 2022-09-16: 0.4 mg via ORAL
  Filled 2022-09-06 (×4): qty 1

## 2022-09-06 MED ORDER — SODIUM CHLORIDE 0.9 % IV SOLN
500.0000 mg | Freq: Once | INTRAVENOUS | Status: AC
  Administered 2022-09-06: 500 mg via INTRAVENOUS
  Filled 2022-09-06: qty 5

## 2022-09-06 MED ORDER — CHLORHEXIDINE GLUCONATE CLOTH 2 % EX PADS
6.0000 | MEDICATED_PAD | Freq: Every day | CUTANEOUS | Status: DC
  Administered 2022-09-06 – 2022-09-16 (×11): 6 via TOPICAL

## 2022-09-06 MED ORDER — ALBUTEROL SULFATE (2.5 MG/3ML) 0.083% IN NEBU: 2.5000 mg | INHALATION_SOLUTION | RESPIRATORY_TRACT | Status: DC | PRN

## 2022-09-06 MED ORDER — HYDRALAZINE HCL 25 MG PO TABS
25.0000 mg | ORAL_TABLET | Freq: Three times a day (TID) | ORAL | Status: DC
  Administered 2022-09-06: 25 mg via ORAL
  Filled 2022-09-06 (×2): qty 1

## 2022-09-06 MED ORDER — DICYCLOMINE HCL 10 MG PO CAPS: 10.0000 mg | ORAL_CAPSULE | Freq: Every day | ORAL | Status: DC | PRN

## 2022-09-06 MED ORDER — AMLODIPINE BESYLATE 10 MG PO TABS: 10.0000 mg | ORAL_TABLET | Freq: Every day | ORAL | Status: DC

## 2022-09-06 MED ORDER — APIXABAN 5 MG PO TABS: 5.0000 mg | ORAL_TABLET | Freq: Two times a day (BID) | ORAL | Status: DC

## 2022-09-06 MED ORDER — FOOD THICKENER (SIMPLYTHICK HONEY): 1.0000 | ORAL | Status: DC | PRN

## 2022-09-06 MED ORDER — SODIUM CHLORIDE 0.9 % IV SOLN
500.0000 mg | INTRAVENOUS | Status: AC
  Administered 2022-09-07 – 2022-09-10 (×4): 500 mg via INTRAVENOUS
  Filled 2022-09-06 (×4): qty 5

## 2022-09-06 MED ORDER — LEVETIRACETAM IN NACL 500 MG/100ML IV SOLN
500.0000 mg | Freq: Two times a day (BID) | INTRAVENOUS | Status: AC
  Administered 2022-09-06: 500 mg via INTRAVENOUS
  Filled 2022-09-06: qty 100

## 2022-09-06 MED ORDER — FINASTERIDE 5 MG PO TABS
5.0000 mg | ORAL_TABLET | Freq: Every day | ORAL | Status: DC
  Administered 2022-09-14 – 2022-09-16 (×3): 5 mg via ORAL
  Filled 2022-09-06 (×5): qty 1

## 2022-09-06 MED ORDER — ONDANSETRON HCL 4 MG/2ML IJ SOLN
4.0000 mg | Freq: Four times a day (QID) | INTRAMUSCULAR | Status: DC | PRN
  Administered 2022-09-07: 4 mg via INTRAVENOUS
  Filled 2022-09-06: qty 2

## 2022-09-06 MED ORDER — SODIUM CHLORIDE 0.9 % IV SOLN
INTRAVENOUS | Status: DC
  Administered 2022-09-07: 75 mL/h via INTRAVENOUS

## 2022-09-06 MED ORDER — SODIUM CHLORIDE 0.9 % IV SOLN
1.0000 g | Freq: Once | INTRAVENOUS | Status: AC
  Administered 2022-09-06: 1 g via INTRAVENOUS
  Filled 2022-09-06: qty 10

## 2022-09-06 MED ORDER — ACETAMINOPHEN 325 MG PO TABS: 650.0000 mg | ORAL_TABLET | Freq: Four times a day (QID) | ORAL | Status: DC | PRN

## 2022-09-06 NOTE — ED Triage Notes (Addendum)
Pt BIB [facility] from home d/t SOB started this morning. Pt c/o Nausea and vomiting yesterday. Hx CVA w L side deficits. No oxygen at home. Pt is on NRB d/t sats in the 80s upon EMS arrival. A&O X4 . Follows command. VSS.

## 2022-09-06 NOTE — ED Notes (Signed)
Unable to give pt meds PO as ordered. MD made aware, SLP consult ordered.

## 2022-09-06 NOTE — ED Provider Notes (Signed)
[facility] EMERGENCY DEPARTMENT Provider Note   CSN: [id number] Arrival date & time: 09/06/22  0730     History  Chief Complaint  Patient presents with   Shortness of Breath    Darrell [name] is a 71 y.o. male.  Patient is a 71 year old male with a past medical history of hypertension, hyperlipidemia, CVA with residual left-sided weakness, ALS, chronic dysphagia, CKD stage IV, right lower extremity DVT on Eliquis, BPH on chronic Foley catheter presenting to the emergency department with nausea, vomiting and shortness of breath.  Patient developed nausea and vomiting yesterday and started to feel short of breath this morning.  EMS was called and on their arrival the patient was hypoxic to the 80s in room air and he was placed on nonrebreather with improvement to 100.  Patient states that his breathing has improved since being on the nonrebreather.  Patient denies any chest pain or abdominal pain.  No reported fevers were at home.  The history is provided by the patient and the EMS personnel. The history is limited by the condition of the patient.  Shortness of Breath      Home Medications Prior to Admission medications   Medication Sig Start Date End Date Taking? Authorizing Provider  acetaminophen (TYLENOL) 325 MG tablet Take 325 mg by mouth daily as needed for mild pain or moderate pain.    [provider]  albuterol (VENTOLIN HFA) 108 (90 Base) MCG/ACT inhaler Inhale 2 puffs into the lungs every 6 (six) hours as needed for up to 30 days for Wheezing. 06/08/21   Argentina Donovan, PA-C  amLODipine (NORVASC) 10 MG tablet Take 1 tablet (10 mg total) by mouth daily. 06/07/22   [name], MD  atorvastatin (LIPITOR) 40 MG tablet Take 1 tablet (40 mg total) by mouth daily. 06/07/22   [name], MD  azithromycin (ZITHROMAX Z-PAK) 250 MG tablet Take 1 tablet (250 mg total) by mouth 3 (three) times a week. 08/19/22   Pokhrel, Corrie Mckusick, MD  brimonidine  (ALPHAGAN) 0.2 % ophthalmic solution Place 1 drop into both eyes daily. 07/25/21   [provider]  cetirizine (ZYRTEC) 10 MG tablet Take 10 mg by mouth daily as needed for allergies.    [provider]  dicyclomine (BENTYL) 10 MG capsule TAKE 1 CAPSULE BY MOUTH ONCE DAILY AS NEEDED 08/30/22   [name], MD  dorzolamide-timolol (COSOPT) 22.3-6.8 MG/ML ophthalmic solution Place 1 drop into both eyes daily.    [provider]  ELIQUIS 5 MG TABS tablet TAKE 1 TABLET BY MOUTH TWICE DAILY UPON  COMPLETION  OF  STARTER  PACK 08/14/22   [name], MD  ferrous sulfate (FEROSUL) 325 (65 FE) MG tablet Take 1 tablet (325 mg total) by mouth 2 (two) times daily with a meal. 06/08/21   McClung, Dionne Bucy, PA-C  finasteride (PROSCAR) 5 MG tablet Take 5 mg by mouth daily. 12/13/21   [provider]  fluticasone (FLONASE) 50 MCG/ACT nasal spray Place 1 spray into both nostrils daily as needed for allergies.    [provider]  fluticasone furoate-vilanterol (BREO ELLIPTA) 200-25 MCG/ACT AEPB Inhale 2 puffs into the lungs daily as needed (for shortness of breath).    [provider]  glycopyrrolate (ROBINUL) 1 MG tablet Take 1 tablet (1 mg total) by mouth 2 times daily. 06/07/22   [name], MD  guaiFENesin (MUCINEX) 600 MG 12 hr tablet Take 600 mg by mouth daily.    [provider]  hydrALAZINE (APRESOLINE) 25 MG tablet Take 1 tablet (25 mg total) by mouth 3 (three) times daily. 04/25/22   Shirley Friar, PA-C  hydrocortisone (ANUSOL-HC) 2.5 % rectal cream Place 1 Application rectally 2 (two) times daily. 06/30/22   Vevelyn Francois, NP  latanoprost (XALATAN) 0.005 % ophthalmic solution Place 1 drop into both eyes at bedtime. 07/25/21   [provider]  levETIRAcetam (KEPPRA) 500 MG tablet Take 1 tablet (500 mg total) by mouth 2 (two) times daily. 12/19/21   Penumalli, Earlean Polka, MD  metoprolol succinate (TOPROL-XL) 50 MG 24 hr  tablet Take 1 tablet (50 mg total) by mouth at bedtime. Take with or immediately following a meal. 08/22/22   Camnitz, Ocie Doyne, MD  Misc. Devices MISC SimplyThick Easy Mix Honey Thick gel 08/25/22   Newlin, Charlane Ferretti, MD  montelukast (SINGULAIR) 10 MG tablet Take 1 tablet by mouth at bedtime. 06/19/22   [provider]  Multiple Vitamin (MULTI-VITAMIN) tablet Take 1 tablet by mouth daily.    [provider]  nystatin (MYCOSTATIN/NYSTOP) powder Apply 1 Application topically 2 (two) times daily. 07/06/22   [provider]  ondansetron (ZOFRAN) 4 MG tablet Take 1 tablet (4 mg total) by mouth every 8 (eight) hours as needed for nausea or vomiting. 05/12/22   [name], MD  tamsulosin (FLOMAX) 0.4 MG CAPS capsule Take 1 capsule (0.4 mg total) by mouth daily. 01/24/22   [name], MD  torsemide (DEMADEX) 20 MG tablet Take 20 mg by mouth daily as needed (fluid).    [provider]      Allergies    Ace inhibitors, Doxycycline, Atacand hct [candesartan cilexetil-hctz], and Shellfish allergy    Review of Systems   Review of Systems  Respiratory:  Positive for shortness of breath.     Physical Exam Updated Vital Signs BP 134/71   Pulse 66   Resp 14   SpO2 99%  Physical Exam Vitals and nursing note reviewed.  Constitutional:      General: He is not in acute distress.    Comments: Frail and chronically ill appearing  HENT:     Head: Normocephalic and atraumatic.     Mouth/Throat:     Mouth: Mucous membranes are moist.     Pharynx: Oropharynx is clear.  Eyes:     Extraocular Movements: Extraocular movements intact.  Cardiovascular:     Rate and Rhythm: Normal rate and regular rhythm.     Pulses: Normal pulses.     Heart sounds: Normal heart sounds.  Pulmonary:     Effort: Pulmonary effort is normal.     Breath sounds: Examination of the right-lower field reveals rales. Examination of the left-lower field reveals rales. Rales present.   Abdominal:     Palpations: Abdomen is soft.     Tenderness: There is no abdominal tenderness. There is no guarding or rebound.  Musculoskeletal:     Cervical back: Normal range of motion and neck supple.     Right lower leg: No edema.     Left lower leg: No edema.  Skin:    General: Skin is warm and dry.  Neurological:     Mental Status: He is alert.     Comments: Oriented to person and place, no acute deficits  Psychiatric:        Mood and Affect: Mood normal.        Behavior: Behavior normal.     ED Results / Procedures / Treatments   Labs (all  labs ordered are listed, but only abnormal results are displayed) Labs Reviewed  COMPREHENSIVE METABOLIC PANEL - Abnormal; Notable for the following components:      Result Value   Glucose, Bld 129 (*)    BUN 87 (*)    Creatinine, Ser 3.64 (*)    Total Protein 6.2 (*)    Albumin 3.3 (*)    AST 14 (*)    GFR, Estimated 17 (*)    All other components within normal limits  CBC WITH DIFFERENTIAL/PLATELET - Abnormal; Notable for the following components:   RBC 3.28 (*)    Hemoglobin 9.5 (*)    HCT 29.5 (*)    All other components within normal limits  URINALYSIS, ROUTINE W REFLEX MICROSCOPIC - Abnormal; Notable for the following components:   Leukocytes,Ua TRACE (*)    All other components within normal limits  URINALYSIS, MICROSCOPIC (REFLEX) - Abnormal; Notable for the following components:   Bacteria, UA FEW (*)    Non Squamous Epithelial PRESENT (*)    All other components within normal limits  URINE CULTURE  RESP PANEL BY RT-PCR (RSV, FLU A&B, COVID)  RVPGX2  LACTIC ACID, PLASMA  LIPASE, BLOOD    EKG EKG Interpretation  Date/Time:  Wednesday September 06 2022 07:46:04 EST Ventricular Rate:  70 PR Interval:  265 QRS Duration: 153 QT Interval:  446 QTC Calculation: 482 R Axis:   -59 Text Interpretation: Sinus rhythm Atrial premature complex Prolonged PR interval LVH with IVCD, LAD and secondary repol abnrm  Anterolateral infarct, age indeterminate No significant change since last tracing Confirmed by Leanord Asal ([id number]) on 09/06/2022 7:51:41 AM  Radiology DG Chest Port 1 View  Result Date: 09/06/2022 CLINICAL DATA:  Question sepsis EXAM: PORTABLE CHEST 1 VIEW COMPARISON:  Chest 08/11/2022 FINDINGS: Interval development of infiltrate in the left mid lung. Probable pneumonia. Left lower lobe airspace disease similar to the prior study may be atelectasis or pneumonia Mild right lower lobe airspace disease unchanged likely atelectasis or infiltrate. No effusion. Negative for heart failure. IMPRESSION: Interval development of infiltrate in the left lung compatible with pneumonia. Bibasilar airspace disease unchanged. These findings may be due to atelectasis or additional pneumonia. Electronically Signed   By: Franchot Gallo M.D.   On: 09/06/2022 08:11    Procedures Procedures    Medications Ordered in ED Medications  cefTRIAXone (ROCEPHIN) 1 g in sodium chloride 0.9 % 100 mL IVPB (has no administration in time range)  azithromycin (ZITHROMAX) 500 mg in sodium chloride 0.9 % 250 mL IVPB (has no administration in time range)  lactated ringers bolus 1,000 mL (1,000 mLs Intravenous New Bag/Given 09/06/22 4782)    ED Course/ Medical Decision Making/ A&P Clinical Course as of 09/06/22 0854  Wed Sep 06, 2022  0840 His workup shows evidence of an AKI and a left lower lobe pneumonia on his chest x-ray.  He will be treated with antibiotics.  He has been given a liter of IV fluids.  Patient will require admission for his hypoxia and AKI. [VK]    Clinical Course User Index [VK] Kemper Durie, DO                           Medical Decision Making This patient presents to the ED with chief complaint(s) of nausea, vomiting, shortness of breath with pertinent past medical history of ALS, CVA, chronic dysphagia, chronic Foley, hypertension, seizure disorder, CKD, DVT on Eliquis which further  complicates the presenting  complaint. The complaint involves an extensive differential diagnosis and also carries with it a high risk of complications and morbidity.    The differential diagnosis includes pneumonia, viral syndrome, pulmonary edema, pleural effusion, dehydration, electrolyte abnormality, gastritis, gastroenteritis, UTI, other intra-abdominal infection less likely as patient has had no abdominal tenderness  Additional history obtained: Additional history obtained from spouse and EMS  Records reviewed previous admission documents  ED Course and Reassessment: Upon patient's arrival to the emergency department he was satting well on nonrebreather and was titrated down to 3 L nasal cannula.  He is in no acute respiratory distress and has no other signs of sepsis on exam at this time.  He will have workup including labs and chest x-ray and will be closely reassessed.  Independent labs interpretation:  The following labs were independently interpreted: AKI with creatinine of 3.6 from baseline around 2.5, otherwise within normal range  Independent visualization of imaging: - I independently visualized the following imaging with scope of interpretation limited to determining acute life threatening conditions related to emergency care: Chest x-ray, which revealed left lower lobe pneumonia  Consultation: - Consulted or discussed management/test interpretation w/ external professional: Hospitalists  Consideration for admission or further workup: Patient requires admission for treatment of his pneumonia in the setting of hypoxia and AKI Social Determinants of health: N/A    Amount and/or Complexity of Data Reviewed Labs: ordered. Radiology: ordered. ECG/medicine tests: ordered.  Risk Decision regarding hospitalization.          Final Clinical Impression(s) / ED Diagnoses Final diagnoses:  Pneumonia of left lower lobe due to infectious organism  Hypoxia  AKI (acute  kidney injury) [facility])    Rx / [place] Orders ED Discharge Orders     None         Kemper Durie, DO 09/06/22 [phone]

## 2022-09-06 NOTE — H&P (Addendum)
History and Physical    Patient: Darrell [name] WJX:[id number] DOB: [birth year] DOA: 09/06/2022 DOS: the patient was seen and examined on 09/06/2022 PCP: [name], MD  Patient coming from: Home via EMS  Chief Complaint:  Chief Complaint  Patient presents with   Shortness of Breath   HPI: Darrell [name] is a 71 y.o. male with medical history significant of hypertension, hyperlipidemia, CVA with residual left-sided weakness, ALS, chronic dysphagia, CKD stage IV, DVT on Eliquis, BPH on chronic Foley catheter who presents with complaints of shortness of breath this morning.  History is obtained from from the patient's wife at bedside who is his primary caregiver.  At baseline patient is not on oxygen, bedbound, and requires assistance with all of activities of daily life.  Apparently, yesterday patient had vomited twice.  No reported fevers or chills.  He has had intermittent cough that is unchanged from baseline.  Patient has a chronic indwelling Foley for which wife notes was recently changed last week.  On arrival with EMS patient O2 saturation to be in the 80s placed on nonrebreather with improvement in O2 saturations.  Upon admission to the emergency department patient was noted to be afebrile with tachypnea and O2 saturations currently maintained on 2 L of nasal cannula oxygen.  Labs significant for WBC 6.5, hemoglobin 9.5, BUN 87, creatinine 3.64, lactic acid 0.9.  Chest x-ray noted interval development of infiltrate in the left lung base compatible with pneumonia and bibasilar airspace disease unchanged.  Influenza, COVID-19, and RSV negative.  Urinalysis did not show significant signs of infection.  Patient has been given empiric antibiotics of Rocephin and azithromycin.  Review of Systems: As mentioned in the history of present illness. All other systems reviewed and are negative. Past Medical History:  Diagnosis Date   Arthritis    Asthma    At high risk for falls 08/16/2015    CIDP (chronic inflammatory demyelinating polyneuropathy) ([name])    CKD (chronic kidney disease) stage 3, GFR 30-59 ml/min ([name]) 08/16/2015   Dysphagia as late effect of cerebrovascular disease    pts wife states pt has to eat soft foods    Elevated liver enzymes 08/10/2016   GERD (gastroesophageal reflux disease)    Glaucoma    High cholesterol    History of CVA with residual deficit 03/25/2013   Hypertension    Hypertensive retinopathy of both eyes 01/16/2017   Inguinal hernia 03/25/2013   Liver hemangioma 08/14/2016   New onset seizure ([place]) 07/08/2017   seizure 07/14/18   Nuclear sclerosis of both eyes 10/25/2016   Pneumonia    Presence of permanent cardiac pacemaker    Primary open angle glaucoma of both eyes, indeterminate stage 10/25/2016   Renal mass, right 08/14/2016   Status cardiac pacemaker 01/29/2017   Placed for second degree heart block on 01/16/17 Medtronic Azure XT DR MRI SureScan dual-chamber pacemaker   Stroke [facility])    2011 with residual deficit left sided weakness   Tobacco dependence    Past Surgical History:  Procedure Laterality Date   EYE SURGERY     HERNIA REPAIR     INGUINAL HERNIA REPAIR Right 11/23/2014   Procedure: right inguinal hernia repair with mesh;  Surgeon: Armandina Gemma, MD;  Location: [facility];  Service: General;  Laterality: Right;   INSERTION OF MESH N/A 11/23/2014   Procedure: INSERTION OF MESH;  Surgeon: Armandina Gemma, MD;  Location: [facility];  Service: General;  Laterality: N/A;   JOINT REPLACEMENT  MASS EXCISION Left 08/29/2017   Procedure: EXCISION OF LEFT NECK MASS;  Surgeon: Coralie Keens, MD;  Location: [place];  Service: General;  Laterality: Left;   PACEMAKER IMPLANT N/A 01/16/2017   Procedure: Pacemaker Implant;  Surgeon: Will Meredith Leeds, MD;  Location: [place] [facility];  Service: Cardiovascular;  Laterality: N/A;   SHOULDER SURGERY Bilateral 1988, 1998   Social History:  reports that he has been smoking cigarettes. He has a  10.00 pack-year smoking history. He has never used smokeless tobacco. He reports that he does not drink alcohol and does not use drugs.  Allergies  Allergen Reactions   Ace Inhibitors Other (See Comments)    Hyperkalemia   Doxycycline Other (See Comments)    Hiccups, cough, nausea and emesis, elevated liver enzymes, elevated eosinophils, SOB concerning for early DRESS syndrome    Atacand Hct [Candesartan Cilexetil-Hctz] Hives   Shellfish Allergy Hives    Family History  Problem Relation Age of Onset   Hypertension Mother    Diabetes Sister    Hypertension Sister    Cancer Sister        1 sister   COPD Sister        in 1 sister   Stomach cancer Neg Hx    Colon cancer Neg Hx    Pancreatic cancer Neg Hx    Esophageal cancer Neg Hx     Prior to Admission medications   Medication Sig Start Date End Date Taking? Authorizing Provider  acetaminophen (TYLENOL) 325 MG tablet Take 325 mg by mouth daily as needed for mild pain or moderate pain.    [provider]  albuterol (VENTOLIN HFA) 108 (90 Base) MCG/ACT inhaler Inhale 2 puffs into the lungs every 6 (six) hours as needed for up to 30 days for Wheezing. 06/08/21   Argentina Donovan, PA-C  amLODipine (NORVASC) 10 MG tablet Take 1 tablet (10 mg total) by mouth daily. 06/07/22   [name], MD  atorvastatin (LIPITOR) 40 MG tablet Take 1 tablet (40 mg total) by mouth daily. 06/07/22   [name], MD  azithromycin (ZITHROMAX Z-PAK) 250 MG tablet Take 1 tablet (250 mg total) by mouth 3 (three) times a week. 08/19/22   Pokhrel, Corrie Mckusick, MD  brimonidine (ALPHAGAN) 0.2 % ophthalmic solution Place 1 drop into both eyes daily. 07/25/21   [provider]  cetirizine (ZYRTEC) 10 MG tablet Take 10 mg by mouth daily as needed for allergies.    [provider]  dicyclomine (BENTYL) 10 MG capsule TAKE 1 CAPSULE BY MOUTH ONCE DAILY AS NEEDED 08/30/22   [name], MD  dorzolamide-timolol (COSOPT) 22.3-6.8 MG/ML  ophthalmic solution Place 1 drop into both eyes daily.    [provider]  ELIQUIS 5 MG TABS tablet TAKE 1 TABLET BY MOUTH TWICE DAILY UPON  COMPLETION  OF  STARTER  PACK 08/14/22   [name], MD  ferrous sulfate (FEROSUL) 325 (65 FE) MG tablet Take 1 tablet (325 mg total) by mouth 2 (two) times daily with a meal. 06/08/21   McClung, Dionne Bucy, PA-C  finasteride (PROSCAR) 5 MG tablet Take 5 mg by mouth daily. 12/13/21   [provider]  fluticasone (FLONASE) 50 MCG/ACT nasal spray Place 1 spray into both nostrils daily as needed for allergies.    [provider]  fluticasone furoate-vilanterol (BREO ELLIPTA) 200-25 MCG/ACT AEPB Inhale 2 puffs into the lungs daily as needed (for shortness of breath).    [provider]  glycopyrrolate (ROBINUL) 1  MG tablet Take 1 tablet (1 mg total) by mouth 2 times daily. 06/07/22   [name], MD  guaiFENesin (MUCINEX) 600 MG 12 hr tablet Take 600 mg by mouth daily.    [provider]  hydrALAZINE (APRESOLINE) 25 MG tablet Take 1 tablet (25 mg total) by mouth 3 (three) times daily. 04/25/22   Shirley Friar, PA-C  hydrocortisone (ANUSOL-HC) 2.5 % rectal cream Place 1 Application rectally 2 (two) times daily. 06/30/22   Vevelyn Francois, NP  latanoprost (XALATAN) 0.005 % ophthalmic solution Place 1 drop into both eyes at bedtime. 07/25/21   [provider]  levETIRAcetam (KEPPRA) 500 MG tablet Take 1 tablet (500 mg total) by mouth 2 (two) times daily. 12/19/21   Penumalli, Earlean Polka, MD  metoprolol succinate (TOPROL-XL) 50 MG 24 hr tablet Take 1 tablet (50 mg total) by mouth at bedtime. Take with or immediately following a meal. 08/22/22   Camnitz, Ocie Doyne, MD  Misc. Devices MISC SimplyThick Easy Mix Honey Thick gel 08/25/22   Newlin, Charlane Ferretti, MD  montelukast (SINGULAIR) 10 MG tablet Take 1 tablet by mouth at bedtime. 06/19/22   [provider]  Multiple Vitamin (MULTI-VITAMIN) tablet Take 1  tablet by mouth daily.    [provider]  nystatin (MYCOSTATIN/NYSTOP) powder Apply 1 Application topically 2 (two) times daily. 07/06/22   [provider]  ondansetron (ZOFRAN) 4 MG tablet Take 1 tablet (4 mg total) by mouth every 8 (eight) hours as needed for nausea or vomiting. 05/12/22   [name], MD  tamsulosin (FLOMAX) 0.4 MG CAPS capsule Take 1 capsule (0.4 mg total) by mouth daily. 01/24/22   [name], MD  torsemide (DEMADEX) 20 MG tablet Take 20 mg by mouth daily as needed (fluid).    [provider]    Physical Exam: Vitals:   09/06/22 0930 09/06/22 0945 09/06/22 1000 09/06/22 1015  BP: 134/73 (!) 140/76 134/74 122/72  Pulse: 69 72 74 68  Resp: '15 18 16 15  '$ Temp:      SpO2: 96% 99% 98% 100%    Constitutional: Lethargic elderly male currently leaning to the left side. Eyes: PERRL, lids and conjunctivae normal ENMT: Mucous membranes are dry with left-sided facial droop. Neck: normal, supple,   Respiratory: Decreased overall aeration most notably on the left lung field with Rales present lower lung base.  No significant wheezing appreciated.  O2 saturation currently maintained on 2 L nasal cannula oxygen. Cardiovascular: Regular rate and rhythm,  No extremity edema.  Abdomen: no tenderness, no masses palpated. Bowel sounds positive.  Musculoskeletal: no cyanosis. No joint deformity upper and lower extremities.  Skin: no rashes, lesions, ulcers. No induration Neurologic: CN 2-12 grossly intact.  Chronic left weakness Psychiatric: \oriented x 3. Normal mood.    Data Reviewed:  EKG reveals sinus rhythm at 70 bpm.  Reviewed labs, imaging, and pertinent records as noted above in the HPI.  Assessment and Plan: Acute respiratory failure with hypoxia secondary to aspiration pneumonia Patient presented after being noted to be acutely short of breath and wife reported patient had 2 episodes of vomiting yesterday.  He has chronic left-sided  weakness related to prior stroke residual dysphagia.  O2 saturations reported to be in the 80s on room air with EMS placed on nonrebreather.  At this time patient without white blood cell count or fever.  Chest x-ray showing a left-sided pneumonia with chronic bibasilar atelectasis. -Admit to a telemetry bed -Aspiration precautions with elevation of head of  bed -Spirometry and flutter valve -Follow-up blood culture -Check procalcitonin -Continue empiric antibiotics of Rocephin and   De-escalate if procalcitonin's negative. -Palliative care consulted  Chronic dysphagia  history of CVA with residual deficit Patient has chronic left-sided weakness following prior stroke.  Discussed with the patient's wife in regards to possible need of PEG tube. -Aspiration precautions   -Dysphagia 1 diet with honey thick liquids  Acute kidney injury superimposed on chronic kidney disease stage IV Patient presents with creatinine elevated up to 3.64 with BUN 87.  Baseline creatinine recently been around has been around 2.4-2.5.  Patient wife notes that he has not been taking in enough fluids likely related to the dysphagia diet. -Normal saline IV fluids at 75 mL/h -Recheck kidney function in a.m.  Anemia of chronic kidney disease Hemoglobin 9.5 which appears around patient's baseline. -Continue to monitor  Essential hypertension Blood pressures currently maintained. -Resume metoprolol, hydralazine, and amlodipine as tolerated. -Held as needed torsemide due to AKI  Chronic indwelling Foley secondary to BPH Urinalysis noted trace leukocytes with few bacteria present, but suspect secondary to combination without significant signs for infection. -Continue Foley catheter -Continue finasteride and tamsulosin  Chronic DVT on chronic anticoagulation Initially diagnosed in August 2023.  Patient has been on Eliquis -Continue Eliquis  History of seizure disorder -Continue Keppra  DNR   DVT prophylaxis:  Eliquis Advance Care Planning:   Code Status: DNR   Consults: Palliative care consulted  Family Communication: Wife updated at bedside  Severity of Illness: The appropriate patient status for this patient is INPATIENT. Inpatient status is judged to be reasonable and necessary in order to provide the required intensity of service to ensure the patient's safety. The patient's presenting symptoms, physical exam findings, and initial radiographic and laboratory data in the context of their chronic comorbidities is felt to place them at high risk for further clinical deterioration. Furthermore, it is not anticipated that the patient will be medically stable for discharge from the hospital within 2 midnights of admission.   * I certify that at the point of admission it is my clinical judgment that the patient will require inpatient hospital care spanning beyond 2 midnights from the point of admission due to high intensity of service, high risk for further deterioration and high frequency of surveillance required.*  Author: Norval Morton, MD 09/06/2022 10:33 AM  For on call review [email or web address].

## 2022-09-07 ENCOUNTER — Inpatient Hospital Stay (HOSPITAL_COMMUNITY): Payer: Medicare HMO

## 2022-09-07 DIAGNOSIS — Z7189 Other specified counseling: Secondary | ICD-10-CM

## 2022-09-07 DIAGNOSIS — Z515 Encounter for palliative care: Secondary | ICD-10-CM

## 2022-09-07 LAB — CBC
HCT: 25.6 % — ABNORMAL LOW (ref 39.0–52.0)
Hemoglobin: 8.5 g/dL — ABNORMAL LOW (ref 13.0–17.0)
MCH: 29.8 pg (ref 26.0–34.0)
MCHC: 33.2 g/dL (ref 30.0–36.0)
MCV: 89.8 fL (ref 80.0–100.0)
Platelets: 181 10*3/uL (ref 150–400)
RBC: 2.85 MIL/uL — ABNORMAL LOW (ref 4.22–5.81)
RDW: 14.5 % (ref 11.5–15.5)
WBC: 7.4 10*3/uL (ref 4.0–10.5)
nRBC: 0 % (ref 0.0–0.2)

## 2022-09-07 LAB — BASIC METABOLIC PANEL
Anion gap: 9 (ref 5–15)
BUN: 91 mg/dL — ABNORMAL HIGH (ref 8–23)
CO2: 25 mmol/L (ref 22–32)
Calcium: 9.2 mg/dL (ref 8.9–10.3)
Chloride: 110 mmol/L (ref 98–111)
Creatinine, Ser: 3.56 mg/dL — ABNORMAL HIGH (ref 0.61–1.24)
GFR, Estimated: 18 mL/min — ABNORMAL LOW (ref >60)
Glucose, Bld: 140 mg/dL — ABNORMAL HIGH (ref 70–99)
Potassium: 4.9 mmol/L (ref 3.5–5.1)
Sodium: 144 mmol/L (ref 135–145)

## 2022-09-07 MED ORDER — LEVETIRACETAM IN NACL 500 MG/100ML IV SOLN
500.0000 mg | Freq: Two times a day (BID) | INTRAVENOUS | Status: AC
  Administered 2022-09-07 – 2022-09-14 (×16): 500 mg via INTRAVENOUS
  Filled 2022-09-07 (×16): qty 100

## 2022-09-07 MED ORDER — PANTOPRAZOLE SODIUM 40 MG IV SOLR
40.0000 mg | Freq: Two times a day (BID) | INTRAVENOUS | Status: DC
  Administered 2022-09-07 – 2022-09-08 (×3): 40 mg via INTRAVENOUS
  Filled 2022-09-07 (×3): qty 10

## 2022-09-07 MED ORDER — DEXTROSE IN LACTATED RINGERS 5 % IV SOLN: INTRAVENOUS | Status: DC

## 2022-09-07 NOTE — Progress Notes (Signed)
Pt vomited black emesis once after eating 50% of his dinner. Notified the MD on call , antiemetic was order. Made wife aware that pt will not be able to eat anything else tonight until further evaluation.

## 2022-09-07 NOTE — Progress Notes (Signed)
   Palliative Medicine Inpatient Follow Up Note   I stopped by Darrell [name]'s room this late afternoon after learning of his dark stools and emesis episode. Patient is now on precautions for C.Diff in the setting of multiple stools. KUB ordered which shows marked stomach dilation possibly in the setting of ileus vs. Gastric outlet obstruction.   Darrell [name] is more sleepy on assessment this afternoon. Did abdominal exam - is not bloated or tender.   I spoke to patients spouse about the thought of comfort focused care.   For the time being plan to continue present measures until patient and spouse are able to connect with Hospice.   Questions and concerns addressed/Palliative Support Provided.   Plan for Darrell [name] to speak to patient and his spouse for transition home with hospice  Continue present care at this time inclusive of mIVF, antibiotics, further GI WU  Additional Time Spent: 30  ______________________________________________________________________________________ [facility] Team Team Cell Phone: [phone] Please utilize secure chat with additional questions, if there is no response within 30 minutes please call the above phone number  Palliative Medicine Team providers are available by phone from 7am to 7pm daily and can be reached through the team cell phone.  Should this patient require assistance outside of these hours, please call the patient's attending physician.

## 2022-09-07 NOTE — Evaluation (Signed)
Clinical/Bedside Swallow Evaluation Patient Details  Name: Darrell [name] MRN: [id number] Date of Birth: 1951/08/12  Today's Date: 09/07/2022 Time: SLP Start Time (ACUTE ONLY): 1010 SLP Stop Time (ACUTE ONLY): 1045 SLP Time Calculation (min) (ACUTE ONLY): 35 min  Past Medical History:  Past Medical History:  Diagnosis Date   Arthritis    Asthma    At high risk for falls 08/16/2015   CIDP (chronic inflammatory demyelinating polyneuropathy) ([name])    CKD (chronic kidney disease) stage 3, GFR 30-59 ml/min ([place]) 08/16/2015   Dysphagia as late effect of cerebrovascular disease    pts wife states pt has to eat soft foods    Elevated liver enzymes 08/10/2016   GERD (gastroesophageal reflux disease)    Glaucoma    High cholesterol    History of CVA with residual deficit 03/25/2013   Hypertension    Hypertensive retinopathy of both eyes 01/16/2017   Inguinal hernia 03/25/2013   Liver hemangioma 08/14/2016   New onset seizure ([place]) 07/08/2017   seizure 07/14/18   Nuclear sclerosis of both eyes 10/25/2016   Pneumonia    Presence of permanent cardiac pacemaker    Primary open angle glaucoma of both eyes, indeterminate stage 10/25/2016   Renal mass, right 08/14/2016   Status cardiac pacemaker 01/29/2017   Placed for second degree heart block on 01/16/17 Medtronic Azure XT DR MRI SureScan dual-chamber pacemaker   Stroke [facility])    2011 with residual deficit left sided weakness   Tobacco dependence    Past Surgical History:  Past Surgical History:  Procedure Laterality Date   EYE SURGERY     HERNIA REPAIR     INGUINAL HERNIA REPAIR Right 11/23/2014   Procedure: right inguinal hernia repair with mesh;  Surgeon: Armandina Gemma, MD;  [facility];  Service: General;  Laterality: Right;   INSERTION OF MESH N/A 11/23/2014   Procedure: INSERTION OF MESH;  Surgeon: Armandina Gemma, MD;  Location[facility];  Service: General;  Laterality: N/A;   JOINT REPLACEMENT     MASS EXCISION Left 08/29/2017    Procedure: EXCISION OF LEFT NECK MASS;  Surgeon: Coralie Keens, MD;  Location: [place];  Service: General;  Laterality: Left;   PACEMAKER IMPLANT N/A 01/16/2017   Procedure: Pacemaker Implant;  Surgeon: Will Meredith Leeds, MD;  Location: [place] [facility];  Service: Cardiovascular;  Laterality: N/A;   SHOULDER SURGERY Bilateral 1988, 1998   HPI:  71yo male admitted 09/06/22 with SOB. PMH: HTN, HLD, CVA with residual left weakness, ALS, chronic dysphagia, GERD, CIDP, CKD4, DVT, BPH. Baseline intermittent cough. CXR = infiltrate LLL c/w PNA, and unchanged BLL airspace disease.    Assessment / Plan / Recommendation  Clinical Impression  Pt seen for limited bedside swallow evaluation. Wife in attendance, and reported pt has been doing well with puree/honey thick liquids at home. Suction was set up to facilitate thorough oral care. Pt allowed oral care, using tongue to move swab around in his mouth. Pt was unable to follow commands consistently. Voice quality was at a whisper level. No voicing was produced despite encouragement. Pt was unable to demonstrate a strong cough - any cough for that matter. This raises concern for pt's airway protection. Pt declined PO presentations at this time. If pt/family do not want feeding tube placed, his home diet (puree/HTL)  is the safest/most conservative diet he can be on. SLP Visit Diagnosis: Dysphagia, unspecified (R13.10)    Aspiration Risk  Severe aspiration risk;Risk for inadequate nutrition/hydration  Diet Recommendation NPO   Medication Administration: Crushed with puree    Other  Recommendations Oral Care Recommendations: Oral care QID Other Recommendations: Have oral suction available    Recommendations for follow up therapy are one component of a multi-disciplinary discharge planning process, led by the attending physician.  Recommendations may be updated based on patient status, additional functional criteria and insurance  authorization.  Follow up Recommendations TBD     Assistance Recommended at Discharge  24/7 assist/supervision  Functional Status Assessment Patient has had a recent decline in their functional status and/or demonstrates limited ability to make significant improvements in function in a reasonable and predictable amount of time  Frequency and Duration min 1 x/week  1 week;2 weeks       Prognosis Prognosis for Safe Diet Advancement: Guarded Barriers to Reach Goals: Other (Comment) (medical history)      Swallow Study   General Date of Onset: 09/06/22 HPI: 71yo male admitted 09/06/22 with SOB. PMH: HTN, HLD, CVA with residual left weakness, ALS, chronic dysphagia, GERD, CIDP, CKD4, DVT, BPH. Baseline intermittent cough. CXR = infiltrate LLL c/w PNA, and unchanged BLL airspace disease. Type of Study: Bedside Swallow Evaluation Previous Swallow Assessment: MBS 03/24/22 - D1/HTL (silent aspiration of nectar thick liquids) crushed meds, BSE 05/18/22 - D1/HTL, meds crushed Diet Prior to this Study: NPO Temperature Spikes Noted: No Respiratory Status: Nasal cannula History of Recent Intubation: No Behavior/Cognition: Lethargic/Drowsy;Doesn't follow directions Oral Cavity Assessment: Within Functional Limits Oral Care Completed by SLP: Yes Oral Cavity - Dentition: Poor condition;Missing dentition Vision: Impaired for self-feeding Self-Feeding Abilities: Total assist Patient Positioning: Upright in bed Baseline Vocal Quality: Aphonic Volitional Cough: Cognitively unable to elicit Volitional Swallow: Unable to elicit    Oral/Motor/Sensory Function Overall Oral Motor/Sensory Function: Generalized oral weakness   Ice Chips Ice chips: Not tested   Thin Liquid Thin Liquid: Not tested    Nectar Thick Nectar Thick Liquid: Not tested   Honey Thick Honey Thick Liquid: Not tested   Puree Puree: Not tested   Solid     Solid: Not tested     Brittnae Aschenbrenner B. [name], [facility], [place] Speech Language  Pathologist Office: [phone]  Mylan, Schwarz 09/07/2022,10:57 AM

## 2022-09-07 NOTE — Consult Note (Addendum)
Palliative Medicine Inpatient Consult Note  Consulting Provider:  Norval Morton, MD   Reason for consult:   [facility] Consult  Reason for Consult? Goals of care recurrent aspiration patient is DNR, question treatments for these recurrent aspiration only including fluids, etc   09/07/2022  HPI:  Per intake H&P -->  Darrell [name] is a 71 y.o. male with medical history significant of hypertension, hyperlipidemia, CVA with residual left-sided weakness, ALS, chronic dysphagia, CKD stage IV, DVT on Eliquis, BPH on chronic Foley catheter who presents with complaints of shortness of breath. He has suffered multiple aspiration PNA's and has had (+)6 IP admissions in the past 6 months. He is followed OP by [facility].   PMT saw Mr, [name] in October of this year at which time he and his wife were not ready to accept hospice care.   Clinical Assessment/Goals of Care:  *Please note that this is a verbal dictation therefore any spelling or grammatical errors are due to the "[employer]" system interpretation.  I have reviewed medical records including EPIC notes, labs and imaging, received report from bedside RN, assessed the patient who was sleepy this morning.    I met with Darrell [name] and his  to further discuss diagnosis prognosis, GOC, EOL wishes, disposition and options.   I introduced Palliative Medicine as specialized medical care for people living with serious illness. It focuses on providing relief from the symptoms and stress of a serious illness. The goal is to improve quality of life for both the patient and the family.  Medical History Review and Understanding:  A brief discussion of Darrell [name]'s past medical history was had. Discussed that he has had ALS for the past five years or so. He has Stg IV kidney disease and has prior had seizures.    Social History:  Patient was born and raised in [name], [name]. He is a  retired Administrator. He has been married to his wife for 27 years and they have one daughter. 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 Patient have a good family and faith network, who provide support and assist with patient's care. He listens to sports on TV which he enjoys.   Functional and Nutritional State:  Patient is bedbound at [facility] and requires assistance for all bADLs.    Advance Directives:  A detailed discussion was had today regarding advanced directives. Patient spouse is his surrogate Media planner.    Code Status:  Concepts specific to code status, artifical feeding and hydration, continued IV antibiotics and rehospitalization was had.  The difference between a aggressive medical intervention path  and a palliative comfort care path for this patient at this time was had.   Formal review of Palliative Care and Hospice Care.   Established DNAR/DNI  Discussion:  I spoke at length with Darrell [name] and his spouse, Darrell [name]. I shared that unfortunately in the setting of Darrell [name]'s ALS .  We reviewed the trajectory of disease and how he is lived with this for many years now.  I explained in great detail the ongoing risk of aspiration as unfortunately there is no cure as well as his bulbar function will continue to decline.  We further reviewed that often when he gets to this stage decisions are made in terms of whether or not to continue pursuit of aggressive treatments such as artificial tube feeding which again will not mitigate the risk of ongoing  aspiration.  Alternatively we discussed the thought of hospice and pursuing quality and dignity at the end of his life.  I was able to speak with Darrell [name] who did state to both Darrell [name] and I quite clearly his desire to go home with hospice.  He does not desire to come back and forth to the hospital and does recognize that he is of the final stage of his disease.  I did review with him that under hospice  care he will no longer see his consulting physicians and the care will be administered in the home-the emphasis will be on symptom management therefore things like antibiotics and IV fluids will be stopped.  Patient asked that his wife shares this information with friends and family.  Reviewed with patient and his wife that they were not terribly satisfied with [facility] services and discussed the idea of [facility] which they were in agreement with.  Discussed the importance of continued conversation with family and their  medical providers regarding overall plan of care and treatment options, ensuring decisions are within the context of the patients values and GOCs.  Decision Maker: Class,Darrell [name] (Spouse): [phone] (Mobile)   SUMMARY OF RECOMMENDATIONS   DNAR/DNI  Open and honest conversations held in the setting of patient's ALS --> ongoing risk and recurrence of aspiration pneumonia  Patient has made the decision to pursue hospice  Appreciate TOC helping patient and family navigate which hospice company to utilize -->they were not exactly pleased with the care by [facility] therefore we talked about [facility]  Ongoing palliative care support  Code Status/Advance Care Planning: DNAR/DNI  Palliative Prophylaxis:  Aspiration, Bowel Regimen, Delirium Protocol, Frequent Pain Assessment, Oral Care, Palliative Wound Care, and Turn Reposition  Additional Recommendations (Limitations, Scope, Preferences): Continue current care as  Psycho-social/Spiritual:  Desire for further Chaplaincy support: Not presently Additional Recommendations: Education on ALS   Prognosis: Less than 6 months  Discharge Planning: Discharge home with hospice once everything is arranged  Vitals:   09/06/22 2336 09/07/22 0631  BP: 123/73 133/78  Pulse: 85 94  Resp:    Temp:    SpO2: 98% 96%   No intake or output data in the 24 hours ending 09/07/22 0652  Gen:  [place]  AA M in NAD HEENT: Dry mucous membranes CV: Regular rate and rhythm  PULM:  On 2LPM [place], scattered rhonchi ABD: soft/nontender  EXT: No edema  Neuro: Alert and oriented x3 - slow to respond (+) hypophonia  PPS: 20%   This conversation/these recommendations were discussed with patient primary care team, Dr. Regalado  Billing based on MDM: High  Problems Addressed: One acute or chronic illness or injury that poses a threat to life or bodily function  Amount and/or Complexity of Data: Category 3:Discussion of management or test interpretation with external physician/other qualified health care professional/appropriate source (not separately reported)  Risks: Decision regarding hospitalization or escalation of hospital care and Decision not to resuscitate or to de-escalate care because of poor prognosis ______________________________________________________   [facility] Team Team Cell Phone: [phone] Please utilize secure chat with additional questions, if there is no response within 30 minutes please call the above phone number  Palliative Medicine Team providers are available by phone from 7am to 7pm daily and can be reached through the team cell phone.  Should this patient require assistance outside of these hours, please call the patient's attending physician.   

## 2022-09-07 NOTE — Progress Notes (Addendum)
PROGRESS NOTE    Darrell [name]  FVC:[id number] DOB: 13-Jul-1951 DOA: 09/06/2022 PCP: [name], MD   Brief Narrative: 71 year old with past medical history significant for hypertension, hyperlipidemia, CVA with residual left-sided weakness, ALS, chronic dysphagia, CKD stage IV, DVT on Eliquis, BPH with chronic Foley catheter presents with shortness of breath.  History obtained from wife.  At baseline patient is bedbound not on oxygen, requires assistance with all activities.  He vomited twice the day of admission.  Intermittent cough.  On EMS arrival patient oxygen sat was found to be 80% placed on nonrebreather.  Subsequently he was transitioned to 2 L of oxygen in the ED.  Patient was found to have AKI, chest x-ray with left lung base compatible with pneumonia.  Patient admitted with AKI, vomiting, pneumonia.  Patient with poor prognosis, palliative care has been consulted.  He has had more than 6 hospitalization in the last 6 months.   Assessment & Plan:   Principal Problem:   Acute respiratory failure with hypoxia ([name]) Active Problems:   Aspiration pneumonia ([name])   History of CVA with residual deficit   Dysphagia   Acute kidney injury superimposed on chronic kidney disease ([name])   Anemia, chronic disease   Essential hypertension   Chronic indwelling Foley catheter   Chronic deep vein thrombosis (DVT) ([name])   Chronic anticoagulation   Seizure disorder ([name])   DNR (do not resuscitate)   1-Acute hypoxic respiratory failure possible secondary to aspiration pneumonia: Presented with hypoxia, oxygen saturation down to 80%, chest x-ray with left-sided pneumonia, he had 2 episode of vomiting, worsening shortness of breath. -Continue IV ceftriaxone and azithromycin -Blood cultures no growth to date. -RSV, COVID-19, influenza: Negative -Continue with flutter valve -Procalcitonin 0.3 -Palliative care consulted and following.  Patient wishes to go home with hospice care.  Hospice  liaison to speak with family.   2-Chronic dysphagia, history of CVA with residual deficit Acute on chronic encephalopathy Aspiration precaution N.p.o. as recommended by speech therapy. If Family wishes to proceed with  hospice care approach could consider pured diet, when vomiting resolved.   3-AKI on CKD stage IV: Patient creatinine baseline 2.4--2.5 Cr  peaked to 3.6 on admission. Suspect hypovolemia in the setting of vomiting and poor oral intake Continue with low rate IV fluids.   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  -NPO -Continue with IV fluids -IV protonix BID -Hold Eliquis.  -Addendum:  KUB. Gastric distension concern for gastric out let obstruct vs Ileus.  Discussed with patient and wife at bedside KUB results. They would like to proceed with NG tube placement and CT scan. Plan for further discussion with palliative care tomorrow for goals of care.    Essential hypertension: Continue metoprolol hydralazine amlodipine  Chronic indwelling Foley catheter secondary to BPH: Continue with finasteride and tamsulosin Urine culture growing; Enterococcus  faecalis.  Follow sensitivity currently on IV ceftriaxone.  Chronic DVT on chronic anticoagulation: Diagnosed 2023. Continue with Eliquis  History of seizure disorder: Continue with Keppra  See wound care documentation below  Pressure Injury 05/17/22 Buttocks Right Stage 2 -  Partial thickness loss of dermis presenting as a shallow open injury with a red, pink wound bed without slough. (Active)  05/17/22 2300  Location: Buttocks  Location Orientation: Right  Staging: Stage 2 -  Partial thickness loss of dermis presenting  as a shallow open injury with a red, pink wound bed without slough.  Wound Description (Comments):    Present on Admission: Yes     Pressure Injury 09/07/22 Buttocks Right Stage 2 -  Partial thickness loss of dermis presenting as a shallow open injury with a red, pink wound bed without slough. (Active)  09/07/22 1129  Location: Buttocks  Location Orientation: Right  Staging: Stage 2 -  Partial thickness loss of dermis presenting as a shallow open injury with a red, pink wound bed without slough.  Wound Description (Comments):   Present on Admission: Yes  Dressing Type Foam - Lift dressing to assess site every shift 09/07/22 1100    Estimated body mass index is 21.26 kg/m as calculated from the following:   Height as of 08/01/22: 6' 3.5" (1.918 m).   Weight as of 08/12/22: 78.2 kg.   DVT prophylaxis: Eliquis  Code Status: DNR Family Communication: Wife at bedside.  Disposition Plan:  Status is: Inpatient Remains inpatient appropriate because: management PNA, vomiting , renal failure.     Consultants:  Palliative  Procedures:    Antimicrobials:    Subjective: He is sleepy. He vomited today fluid content  dark, black. "Looks like stool" Had small BM   Objective: Vitals:   09/06/22 2035 09/06/22 2336 09/07/22 0631 09/07/22 0940  BP: 127/71 123/73 133/78 (!) 140/82  Pulse: 79 85 94 96  Resp:    16  Temp: 99 F (37.2 C)   98.4 F (36.9 C)  TempSrc: Oral   Oral  SpO2: 99% 98% 96% 100%    Intake/Output Summary (Last 24 hours) at 09/07/2022 1348 Last data filed at 09/07/2022 0950 Gross per 24 hour  Intake 0 ml  Output --  Net 0 ml   There were no vitals filed for this visit.  Examination:  General exam: Appears calm and comfortable  Respiratory system: Clear to auscultation. Respiratory effort normal. Cardiovascular system: S1 & S2 heard, RRR.  Gastrointestinal system: Abdomen is nondistended, soft and nontender. Central nervous system: Sleepy  Extremities: no edema    Data Reviewed: I have personally reviewed following labs and imaging  studies  CBC: Recent Labs  Lab 09/06/22 0743 09/07/22 0256  WBC 6.5 7.4  NEUTROABS 4.3  --   HGB 9.5* 8.5*  HCT 29.5* 25.6*  MCV 89.9 89.8  PLT 195 509   Basic Metabolic Panel: Recent Labs  Lab 09/06/22 0743 09/07/22 0256  NA 138 144  K 4.4 4.9  CL 104 110  CO2 23 25  GLUCOSE 129* 140*  BUN 87* 91*  CREATININE 3.64* 3.56*  CALCIUM 9.6 9.2   GFR: CrCl cannot be calculated (Unknown ideal weight.). Liver Function Tests: Recent Labs  Lab 09/06/22 0743  AST 14*  ALT 15  ALKPHOS 71  BILITOT 0.3  PROT 6.2*  ALBUMIN 3.3*   Recent Labs  Lab 09/06/22 0743  LIPASE 40   No results for input(s): "AMMONIA" in the last 168 hours. Coagulation Profile: No results for input(s): "INR", "PROTIME" in the last 168 hours. Cardiac Enzymes: No results for input(s): "CKTOTAL", "CKMB", "CKMBINDEX", "TROPONINI" in the last 168 hours. BNP (last 3 results) No results for input(s): "PROBNP" in the last 8760 hours. HbA1C: No results for input(s): "HGBA1C" in the last 72 hours. CBG: No results for input(s): "GLUCAP" in the last 168 hours. Lipid Profile: No results for input(s): "CHOL", "HDL", "LDLCALC", "TRIG", "CHOLHDL", "LDLDIRECT" in the last 72 hours. Thyroid Function Tests: No results for input(s): "TSH", "  T4TOTAL", "FREET4", "T3FREE", "THYROIDAB" in the last 72 hours. Anemia Panel: No results for input(s): "VITAMINB12", "FOLATE", "FERRITIN", "TIBC", "IRON", "RETICCTPCT" in the last 72 hours. Sepsis Labs: Recent Labs  Lab 09/06/22 0743  PROCALCITON 0.31  LATICACIDVEN 0.9    Recent Results (from the past 240 hour(s))  Urine Culture     Status: Abnormal (Preliminary result)   Collection Time: 09/06/22  7:49 AM   Specimen: In/Out Cath Urine  Result Value Ref Range Status   Specimen Description IN/OUT CATH URINE  Final   Special Requests NONE  Final   Culture (A)  Final    >=100,000 COLONIES/mL ENTEROCOCCUS FAECALIS SUSCEPTIBILITIES TO FOLLOW Performed at [facility], [place] [address] [place], [place] [address]    Report Status PENDING  Incomplete  Resp panel by RT-PCR (RSV, Flu A&B, Covid) Anterior Nasal Swab     Status: None   Collection Time: 09/06/22  7:51 AM   Specimen: Anterior Nasal Swab  Result Value Ref Range Status   SARS Coronavirus 2 by RT PCR NEGATIVE NEGATIVE Final    Comment: (NOTE) SARS-CoV-2 target nucleic acids are NOT DETECTED.  The SARS-CoV-2 RNA is generally detectable in upper respiratory specimens during the acute phase of infection. The lowest concentration of SARS-CoV-2 viral copies this assay can detect is 138 copies/mL. A negative result does not preclude SARS-Cov-2 infection and should not be used as the sole basis for treatment or other patient management decisions. A negative result may occur with  improper specimen collection/handling, submission of specimen other than nasopharyngeal swab, presence of viral mutation(s) within the areas targeted by this assay, and inadequate number of viral copies(<138 copies/mL). A negative result must be combined with clinical observations, patient history, and epidemiological information. The expected result is Negative.  Fact Sheet for Patients:  EntrepreneurPulse.com.au  Fact Sheet for Healthcare Providers:  IncredibleEmployment.be  This test is no t yet approved or cleared by the [place] FDA and  has been authorized for detection and/or diagnosis of SARS-CoV-2 by FDA under an Emergency Use Authorization (EUA). This EUA will remain  in effect (meaning this test can be used) for the duration of the COVID-19 declaration under Section 564(b)(1) of the Act, 21 U.S.C.section 360bbb-3(b)(1), unless the authorization is terminated  or revoked sooner.       Influenza A by PCR NEGATIVE NEGATIVE Final   Influenza B by PCR NEGATIVE NEGATIVE Final    Comment: (NOTE) The Xpert Xpress SARS-CoV-2/FLU/RSV plus assay is intended as an  aid in the diagnosis of influenza from Nasopharyngeal swab specimens and should not be used as a sole basis for treatment. Nasal washings and aspirates are unacceptable for Xpert Xpress SARS-CoV-2/FLU/RSV testing.  Fact Sheet for Patients: EntrepreneurPulse.com.au  Fact Sheet for Healthcare Providers: IncredibleEmployment.be  This test is not yet approved or cleared by the [place] FDA and has been authorized for detection and/or diagnosis of SARS-CoV-2 by FDA under an Emergency Use Authorization (EUA). This EUA will remain in effect (meaning this test can be used) for the duration of the COVID-19 declaration under Section 564(b)(1) of the Act, 21 U.S.C. section 360bbb-3(b)(1), unless the authorization is terminated or revoked.     Resp Syncytial Virus by PCR NEGATIVE NEGATIVE Final    Comment: (NOTE) Fact Sheet for Patients: EntrepreneurPulse.com.au  Fact Sheet for Healthcare Providers: IncredibleEmployment.be  This test is not yet approved or cleared by the [place] FDA and has been authorized for detection and/or diagnosis of SARS-CoV-2 by FDA under an  Emergency Use Authorization (EUA). This EUA will remain in effect (meaning this test can be used) for the duration of the COVID-19 declaration under Section 564(b)(1) of the Act, 21 U.S.C. section 360bbb-3(b)(1), unless the authorization is terminated or revoked.  Performed at [facility], [place] [address], [place], [place] [address]   Culture, blood (single) w Reflex to ID Panel     Status: None (Preliminary result)   Collection Time: 09/06/22  5:03 PM   Specimen: BLOOD  Result Value Ref Range Status   Specimen Description BLOOD RIGHT ANTECUBITAL  Final   Special Requests   Final    BOTTLES DRAWN AEROBIC AND ANAEROBIC Blood Culture adequate volume   Culture   Final    NO GROWTH < 24 HOURS Performed at [facility], [place] [address] [place], [place] [address]    Report Status PENDING  Incomplete         Radiology Studies: DG Chest Port 1 View  Result Date: 09/06/2022 CLINICAL DATA:  Question sepsis EXAM: PORTABLE CHEST 1 VIEW COMPARISON:  Chest 08/11/2022 FINDINGS: Interval development of infiltrate in the left mid lung. Probable pneumonia. Left lower lobe airspace disease similar to the prior study may be atelectasis or pneumonia Mild right lower lobe airspace disease unchanged likely atelectasis or infiltrate. No effusion. Negative for heart failure. IMPRESSION: Interval development of infiltrate in the left lung compatible with pneumonia. Bibasilar airspace disease unchanged. These findings may be due to atelectasis or additional pneumonia. Electronically Signed   By: Franchot Gallo M.D.   On: 09/06/2022 08:11        Scheduled Meds:  amLODipine  10 mg Oral Daily   atorvastatin  40 mg Oral Daily   Chlorhexidine Gluconate Cloth  6 each Topical Daily   finasteride  5 mg Oral Daily   hydrALAZINE  25 mg Oral TID   metoprolol succinate  50 mg Oral QHS   pantoprazole (PROTONIX) IV  40 mg Intravenous Q12H   tamsulosin  0.4 mg Oral Daily   Continuous Infusions:  azithromycin 500 mg (09/07/22 1231)   cefTRIAXone (ROCEPHIN)  IV 2 g (09/07/22 1031)   dextrose 5% lactated ringers 75 mL/hr at 09/07/22 1239   levETIRAcetam       LOS: 1 day    Time spent: 35 minutes    Edlyn Rosenburg A Vivianne Carles, MD [facility]   If 7PM-7AM, please contact night-coverage [email or web address]  09/07/2022, 1:48 PM

## 2022-09-07 NOTE — TOC Initial Note (Signed)
Transition of Care (TOC) - Initial/Assessment Note   Spoke to patient and wife at bedside.   [name] would like to take patient home with hospice through [facility] . Patient has hospital bed at home. Does not have home oxygen.   [name] is contact person, confirmed phone number.   Patient will need ambulance transport home at discharge.   Referral for home with hospice given to [facility] with [name]. If accepted they order home DME   TOC will continue to follow   Patient Details  Name: Darrell [name] MRN: [id number] Date of Birth: [birth year]  Transition of Care [facility]) CM/SW Contact:    Marilu Favre, RN Phone Number: 09/07/2022, 9:13 AM  Clinical Narrative:                   Expected Discharge Plan: Home w Hospice Care Barriers to Discharge: Continued Medical Work up   Patient Goals and CMS Choice   CMS Medicare.gov Compare Post Acute Care list provided to:: Patient Represenative (must comment) (to go home with hospice wife) Choice offered to / list presented to : Spouse  Expected Discharge Plan and Services Expected Discharge Plan: Home w Hospice Care   Discharge Planning Services: CM Consult Post Acute Care Choice: Hospice                   DME Arranged:  ([facility])           [place] Agency:  ([facility])        Prior Living Arrangements/Services   Lives with:: Spouse Patient language and need for interpreter reviewed:: Yes Do you feel safe going back to the place where you live?: Yes      Need for Family Participation in Patient Care: Yes (Comment) Care giver support system in place?: Yes (comment) Current home services: DME Criminal Activity/Legal Involvement Pertinent to Current Situation/Hospitalization: No - Comment as needed  Activities of Daily Living      Permission Sought/Granted      Share Information with NAME: [name] wife  Permission granted to share info w AGENCY: [facility]         Emotional Assessment Appearance:: Appears stated age       Alcohol / Substance Use: Not Applicable Psych Involvement: No (comment)  Admission diagnosis:  Hypoxia [R09.02] Acute respiratory failure with hypoxia ([place]) [J96.01] AKI (acute kidney injury) ([place]) [N17.9] Pneumonia of left lower lobe due to infectious organism [J18.9] Patient Active Problem List   Diagnosis Date Noted   Chronic indwelling Foley catheter 08/11/2022   Diarrhea 08/11/2022   Chronic anticoagulation 08/11/2022   Aspiration pneumonia ([place]) 07/06/2022   Urinary retention 07/06/2022   Catheter-associated urinary tract infection ([place]) 07/06/2022   Chronic deep vein thrombosis (DVT) ([place]) 05/17/2022   PNA (pneumonia) 05/17/2022   DNR (do not resuscitate) 04/13/2022   Community acquired bacterial pneumonia 03/23/2022   Acute respiratory failure with hypoxia ([place]) 10/10/2021   Coffee ground emesis 10/07/2021   Hyperkalemia 10/07/2021   Normocytic anemia 10/07/2021   Bigeminal rhythm 10/07/2021   Fecal impaction ([place])    Centrilobular emphysema ([place]) 00/76/2263   Acute metabolic encephalopathy 33/54/5625   COVID-19 virus infection 05/06/2021   Anemia, chronic disease 05/06/2021   Benign prostatic hyperplasia 05/06/2021   Ulcer of ankle ([place]) 03/29/2021   CKD (chronic kidney disease), stage IV ([place]) 03/09/2021   Cognitive communication deficit 01/10/2021   Chronic bronchitis ([place]) 05/25/2020   Chronic cough 05/25/2020  Goals of care, counseling/discussion    Palliative care by specialist    Acute kidney injury superimposed on chronic kidney disease ([place]) 05/05/2019   Pain due to onychomycosis of toenails of both feet 03/25/2019   Primary lateral sclerosis ([place]) 08/28/2018   ALS (amyotrophic lateral sclerosis) ([place]) 07/24/2018   Dysarthria 05/21/2018   Dysphagia 05/21/2018   Gait abnormality 05/21/2018   Spasticity 05/21/2018   GERD (gastroesophageal reflux disease) 07/21/2017   Mixed hyperlipidemia  07/21/2017   Bradycardia    Seizure disorder ([place]) 07/08/2017   Status cardiac pacemaker 01/29/2017   Unintended weight loss 01/29/2017   Tinea pedis of right foot 01/29/2017   Hypertensive retinopathy of both eyes 01/16/2017   Hypercalcemia 01/15/2017   Heart block 01/15/2017   Nuclear sclerosis of both eyes 10/25/2016   Primary open angle glaucoma of both eyes, indeterminate stage 10/25/2016   Glaucoma 09/12/2016   Liver hemangioma 08/14/2016   Renal cyst 08/14/2016   Renal mass, right 08/14/2016   Elevated liver enzymes 08/10/2016   Chronic kidney disease, stage 3b ([place]) 08/16/2015   At high risk for falls 08/16/2015   Subcutaneous nodules 08/16/2015   Prediabetes 10/07/2013   Inguinal hernia 03/25/2013   History of CVA with residual deficit 03/25/2013   Essential hypertension 10/09/2012   Tobacco abuse 10/09/2012   PCP:  [name], MD Pharmacy:   [facility] ([facility] [place] [address] [place] [address] Phone: [phone] Fax: [phone]  [facility] [name] [address] Phone: [phone] Fax: [phone]     Social Determinants of Health (SDOH) Interventions    Readmission Risk Interventions    05/19/2022    3:24 PM  Readmission Risk Prevention Plan  Transportation Screening Complete  Medication Review ([place]) Complete  HRI or [place] Complete  [place] Not Applicable

## 2022-09-07 NOTE — Progress Notes (Signed)
Pt went to CT scan.  1855- Pt return from CT scan. Bed in lowest position. IV reconnected and place back on oxygen.

## 2022-09-07 NOTE — Plan of Care (Signed)
  Problem: Education: Goal: Knowledge of General Education information will improve Description: Including pain rating scale, medication(s)/side effects and non-pharmacologic comfort measures 09/07/2022 0214 by Dorena Cookey, LPN Outcome: Progressing 09/07/2022 0010 by Dorena Cookey, LPN Outcome: Progressing   Problem: Health Behavior/Discharge Planning: Goal: Ability to manage health-related needs will improve 09/07/2022 0214 by Dorena Cookey, LPN Outcome: Progressing 09/07/2022 0010 by Dorena Cookey, LPN Outcome: Progressing   Problem: Clinical Measurements: Goal: Ability to maintain clinical measurements within normal limits will improve 09/07/2022 0214 by Dorena Cookey, LPN Outcome: Progressing 09/07/2022 0010 by Dorena Cookey, LPN Outcome: Progressing Goal: Will remain free from infection 09/07/2022 0214 by Dorena Cookey, LPN Outcome: Progressing 09/07/2022 0010 by Dorena Cookey, LPN Outcome: Progressing Goal: Diagnostic test results will improve 09/07/2022 0214 by Dorena Cookey, LPN Outcome: Progressing 09/07/2022 0010 by Dorena Cookey, LPN Outcome: Progressing Goal: Respiratory complications will improve 09/07/2022 0214 by Dorena Cookey, LPN Outcome: Progressing 09/07/2022 0010 by Dorena Cookey, LPN Outcome: Progressing Goal: Cardiovascular complication will be avoided 09/07/2022 0214 by Dorena Cookey, LPN Outcome: Progressing 09/07/2022 0010 by Dorena Cookey, LPN Outcome: Progressing   Problem: Clinical Measurements: Goal: Will remain free from infection 09/07/2022 0214 by Dorena Cookey, LPN Outcome: Progressing 09/07/2022 0010 by Dorena Cookey, LPN Outcome: Progressing   Problem: Clinical Measurements: Goal: Will remain free from infection 09/07/2022 0214 by Dorena Cookey, LPN Outcome: Progressing 09/07/2022 0010 by Dorena Cookey, LPN Outcome: Progressing   Problem: Clinical  Measurements: Goal: Will remain free from infection 09/07/2022 0214 by Dorena Cookey, LPN Outcome: Progressing 09/07/2022 0010 by Dorena Cookey, LPN Outcome: Progressing   Problem: Nutrition: Goal: Adequate nutrition will be maintained 09/07/2022 0214 by Dorena Cookey, LPN Outcome: Progressing 09/07/2022 0010 by Dorena Cookey, LPN Outcome: Not Met (add Reason)   Problem: Coping: Goal: Level of anxiety will decrease 09/07/2022 0214 by Dorena Cookey, LPN Outcome: Progressing 09/07/2022 0010 by Dorena Cookey, LPN Outcome: Progressing   Problem: Elimination: Goal: Will not experience complications related to bowel motility 09/07/2022 0214 by Dorena Cookey, LPN Outcome: Progressing 09/07/2022 0010 by Dorena Cookey, LPN Outcome: Progressing Goal: Will not experience complications related to urinary retention 09/07/2022 0214 by Dorena Cookey, LPN Outcome: Progressing 09/07/2022 0010 by Dorena Cookey, LPN Outcome: Progressing   Problem: Pain Managment: Goal: General experience of comfort will improve 09/07/2022 0214 by Dorena Cookey, LPN Outcome: Progressing 09/07/2022 0010 by Dorena Cookey, LPN Outcome: Progressing

## 2022-09-07 NOTE — Progress Notes (Signed)
Patient vomited black emesis 4 mg IV Zofran given

## 2022-09-07 NOTE — Progress Notes (Signed)
   Pt referred to hospice services for hospice care at home upon d/c.  The pt has been worked up and approved for hospice care at home. I have tried to reach wife [name] around 300pm by cell number and left VM to return call so we could talk or meet to discuss hospice further. I also tried the room number at hospital as well thinking she would be present in room but had no luck with anyone answering this phone either.    Plan to come by room tomorrow as I will be on hospital campus and I will continue to try and reach the pt wife for further discussion about hospice services at home.   Webb Silversmith RN [phone]

## 2022-09-07 NOTE — Plan of Care (Signed)
  Problem: Education: Goal: Knowledge of General Education information will improve Description: Including pain rating scale, medication(s)/side effects and non-pharmacologic comfort measures Outcome: Progressing   Problem: Health Behavior/Discharge Planning: Goal: Ability to manage health-related needs will improve Outcome: Progressing   Problem: Clinical Measurements: Goal: Ability to maintain clinical measurements within normal limits will improve Outcome: Progressing Goal: Will remain free from infection Outcome: Progressing Goal: Diagnostic test results will improve Outcome: Progressing Goal: Respiratory complications will improve Outcome: Progressing Goal: Cardiovascular complication will be avoided Outcome: Progressing   Problem: Activity: Goal: Risk for activity intolerance will decrease Outcome: Progressing   Problem: Nutrition: Goal: Adequate nutrition will be maintained Outcome: Not Met (add Reason)   Problem: Coping: Goal: Level of anxiety will decrease Outcome: Progressing   Problem: Elimination: Goal: Will not experience complications related to bowel motility Outcome: Progressing Goal: Will not experience complications related to urinary retention Outcome: Progressing   Problem: Pain Managment: Goal: General experience of comfort will improve Outcome: Progressing   Problem: Safety: Goal: Ability to remain free from injury will improve Outcome: Progressing

## 2022-09-07 NOTE — Consult Note (Signed)
[place]: Referral received for home care hospice at discharge. Awaiting return call from spouse to discuss services via telephone or if in person visit preferred. Awaiting return call. Please call Sharalyn Ink, RN [phone] with any questions. Thank you for the opportunity to serve this patient and family.

## 2022-09-08 ENCOUNTER — Ambulatory Visit: Payer: Self-pay

## 2022-09-08 DIAGNOSIS — Z7189 Other specified counseling: Secondary | ICD-10-CM | POA: Diagnosis not present

## 2022-09-08 DIAGNOSIS — G1221 Amyotrophic lateral sclerosis: Secondary | ICD-10-CM

## 2022-09-08 DIAGNOSIS — J189 Pneumonia, unspecified organism: Secondary | ICD-10-CM | POA: Diagnosis not present

## 2022-09-08 DIAGNOSIS — J690 Pneumonitis due to inhalation of food and vomit: Secondary | ICD-10-CM | POA: Diagnosis not present

## 2022-09-08 DIAGNOSIS — L8990 Pressure ulcer of unspecified site, unspecified stage: Secondary | ICD-10-CM | POA: Insufficient documentation

## 2022-09-08 DIAGNOSIS — Z515 Encounter for palliative care: Secondary | ICD-10-CM | POA: Diagnosis not present

## 2022-09-08 LAB — HEMOGLOBIN AND HEMATOCRIT, BLOOD
HCT: 23.3 % — ABNORMAL LOW (ref 39.0–52.0)
Hemoglobin: 7.4 g/dL — ABNORMAL LOW (ref 13.0–17.0)

## 2022-09-08 LAB — BASIC METABOLIC PANEL
Anion gap: 7 (ref 5–15)
BUN: 108 mg/dL — ABNORMAL HIGH (ref 8–23)
CO2: 24 mmol/L (ref 22–32)
Calcium: 8.9 mg/dL (ref 8.9–10.3)
Chloride: 115 mmol/L — ABNORMAL HIGH (ref 98–111)
Creatinine, Ser: 3.8 mg/dL — ABNORMAL HIGH (ref 0.61–1.24)
GFR, Estimated: 16 mL/min — ABNORMAL LOW (ref >60)
Glucose, Bld: 153 mg/dL — ABNORMAL HIGH (ref 70–99)
Potassium: 4.7 mmol/L (ref 3.5–5.1)
Sodium: 146 mmol/L — ABNORMAL HIGH (ref 135–145)

## 2022-09-08 LAB — URINE CULTURE: Culture: >=100000 — AB

## 2022-09-08 LAB — CBC
HCT: 21.2 % — ABNORMAL LOW (ref 39.0–52.0)
Hemoglobin: 6.8 g/dL — CL (ref 13.0–17.0)
MCH: 29.8 pg (ref 26.0–34.0)
MCHC: 32.1 g/dL (ref 30.0–36.0)
MCV: 93 fL (ref 80.0–100.0)
Platelets: 149 10*3/uL — ABNORMAL LOW (ref 150–400)
RBC: 2.28 MIL/uL — ABNORMAL LOW (ref 4.22–5.81)
RDW: 14.6 % (ref 11.5–15.5)
WBC: 7.6 10*3/uL (ref 4.0–10.5)
nRBC: 0 % (ref 0.0–0.2)

## 2022-09-08 LAB — PREPARE RBC (CROSSMATCH): Order Confirmation: POSITIVE

## 2022-09-08 MED ORDER — HYDRALAZINE HCL 20 MG/ML IJ SOLN
5.0000 mg | Freq: Four times a day (QID) | INTRAMUSCULAR | Status: DC | PRN
  Administered 2022-09-09: 5 mg via INTRAVENOUS
  Filled 2022-09-08: qty 1

## 2022-09-08 MED ORDER — PANTOPRAZOLE INFUSION (NEW) - SIMPLE MED
8.0000 mg/h | INTRAVENOUS | Status: AC
  Administered 2022-09-08 – 2022-09-09 (×4): 8 mg/h via INTRAVENOUS
  Filled 2022-09-08 (×6): qty 100

## 2022-09-08 MED ORDER — SODIUM CHLORIDE 0.9 % IV SOLN
3.0000 g | Freq: Two times a day (BID) | INTRAVENOUS | Status: DC
  Administered 2022-09-08 – 2022-09-12 (×9): 3 g via INTRAVENOUS
  Filled 2022-09-08 (×9): qty 8

## 2022-09-08 MED ORDER — SODIUM CHLORIDE 0.9% IV SOLUTION: Freq: Once | INTRAVENOUS | Status: AC

## 2022-09-08 MED ORDER — PANTOPRAZOLE SODIUM 40 MG IV SOLR
40.0000 mg | Freq: Two times a day (BID) | INTRAVENOUS | Status: DC
  Administered 2022-09-12 – 2022-09-13 (×4): 40 mg via INTRAVENOUS
  Filled 2022-09-08 (×4): qty 10

## 2022-09-08 MED ORDER — PANTOPRAZOLE 80MG IVPB - SIMPLE MED
80.0000 mg | Freq: Once | INTRAVENOUS | Status: AC
  Administered 2022-09-08: 80 mg via INTRAVENOUS
  Filled 2022-09-08: qty 100

## 2022-09-08 NOTE — Care Management Important Message (Signed)
Important Message  Patient Details  Name: Darrell [name] MRN: [id number] Date of Birth: 10-Apr-1951   Medicare Important Message Given:  Yes     Hannah Beat 09/08/2022, 2:16 PM

## 2022-09-08 NOTE — Progress Notes (Signed)
Palliative:  HPI:  71 y.o. male with medical history significant of hypertension, hyperlipidemia, CVA with residual left-sided weakness, ALS, chronic dysphagia, CKD stage IV, DVT on Eliquis, BPH on chronic Foley catheter who presents with complaints of shortness of breath. He has suffered multiple aspiration PNA's and has had (+)6 IP admissions in the past 6 months. He is followed OP by [facility]. Palliative consultation in October of this year at which time he and his wife were not ready to accept hospice care.   I met today with Darrell [name] and wife, Darrell [name], at bedside after reviewing records and discussing with Dr. Tyrell Antonio. Darrell [name] has made the decision to proceed home with hospice. Unfortunately he had significant emesis with concern for melena yesterday afternoon/evening. They have had a conversation with hospice liaison today. I spoke with them about their goals and wishes at this time. We discussed the potential of transition to comfort and minimizing interventions with the goal to return home with hospice or even consideration of hospice facility placement. They both confirm that their preference is to be at home. I did speak with them about the option of going home with hospice and if symptoms worsen then he could transition to hospice facility for care. At this time they wish to continue IVF, antibiotics, labs, and blood transfusion as indicated. They are not interested in pursing invasive work up such as endoscopy. They request continuance of conservative treatment with hopes of optimizing him to enjoy time at home. We agreed to reassess these goals and plans again tomorrow.   All questions/concerns addressed. Emotional support provided. Updated Dr. Tyrell Antonio and hospice liaison.   Exam: Lethargic. Poor reserve. Able to speak with soft voice and much effort occasionally. He is aware and oriented to conversation but says little verbally. NGT to LIWS. No distress. Breathing regular,  unlabored. Abd soft, non-tender. Weakness as anticipated with progressing ALS.   Plan: - DNR - Continue IVF, antibiotics, blood transfusions for now - Reassess goals and plan tomorrow  55 min  Vinie Sill, NP Palliative Medicine Team Pager [phone] (Please see [email or web address] for schedule) Team Phone [phone]    Greater than 50%  of this time was spent counseling and coordinating care related to the above assessment and plan

## 2022-09-08 NOTE — Progress Notes (Signed)
SLP Cancellation Note  Patient Details Name: Darrell [name] MRN: [id number] DOB: 04-Feb-1951   Cancelled treatment:       Reason Eval/Treat Not Completed: Medical issues which prohibited therapy- Pt has NG for suctioning; not yet appropriate for PO trials. D/W Dr. Tyrell Antonio. Will follow for readiness. D/W Mrs. Widrig at bedside. Would anticipate that he will resume dysphagia 1/honey thick liquids (baseline diet).  [name], MA CCC/SLP Clinical Specialist - Acute Care SLP Acute Rehabilitation Services Office number [phone]    [name] 09/08/2022, 10:48 AM

## 2022-09-08 NOTE — Progress Notes (Signed)
PROGRESS NOTE    Darrell [name]  DXI:[id number] DOB: 14-Mar-1951 DOA: 09/06/2022 PCP: [name], MD   Brief Narrative: 71 year old with past medical history significant for hypertension, hyperlipidemia, CVA with residual left-sided weakness, ALS, chronic dysphagia, CKD stage IV, DVT on Eliquis, BPH with chronic Foley catheter presents with shortness of breath.  History obtained from wife.  At baseline patient is bedbound not on oxygen, requires assistance with all activities.  He vomited twice the day of admission.  Intermittent cough.  On EMS arrival patient oxygen sat was found to be 80% placed on nonrebreather.  Subsequently he was transitioned to 2 L of oxygen in the ED.  Patient was found to have AKI, chest x-ray with left lung base compatible with pneumonia.  Patient admitted with AKI, vomiting, pneumonia.  Patient with poor prognosis, palliative care has been consulted.  He has had more than 6 hospitalization in the last 6 months.  Family and patient discussed with palliative, Continue with IV fluids, IV antibiotics, Blood transfusion as needed, and reassess condition 12/16. No plan for endoscopy  Assessment & Plan:   Principal Problem:   Aspiration pneumonia ([place]) Active Problems:   Acute respiratory failure with hypoxia ([name])   History of CVA with residual deficit   Dysphagia   Acute kidney injury superimposed on chronic kidney disease ([name])   Anemia, chronic disease   Essential hypertension   Chronic indwelling Foley catheter   Chronic deep vein thrombosis (DVT) ([name])   Chronic anticoagulation   Seizure disorder ([name])   DNR (do not resuscitate)   Pressure injury of skin   1-Acute hypoxic respiratory failure possible secondary to aspiration pneumonia: Presented with hypoxia, oxygen saturation down to 80%, chest x-ray with left-sided pneumonia, he had 2 episode of vomiting, worsening shortness of breath. -Continue IV ceftriaxone and azithromycin -Blood cultures no  growth to date. -RSV, COVID-19, influenza: Negative -Continue with flutter valve -Procalcitonin 0.3 -Palliative care consulted and following.  Patient wishes to go home with hospice care.   -plan to continue with IV antibiotics, fluids for Now.    2-Chronic dysphagia, history of CVA with residual deficit Acute on chronic encephalopathy Aspiration precaution NPO, due to vomiting.  If Family wishes to proceed with  hospice care approach could consider pured diet, when vomiting resolved.   3-AKI on CKD stage IV: Patient creatinine baseline 2.4--2.5 Cr  peaked to 3.6 on admission. Suspect hypovolemia in the setting of vomiting and poor oral intake Continue with IV fluid. Worsening renal function today.  He is not good candidate for HD, he is bed bound, has recurrent aspiration.    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  Hb 8.5---9.5--6.8 Related to Upper GI loss, bleed.  One unit PRBC transfusion. Patient and wife agrees with blood transfusion.  Monitor hb,   5-Vomiting/ black content: Gastric Outlet obstruction vs gastroparesis.  -NPO -Continue with IV fluids. -IV protonix  change to gtt.  -Hold Eliquis.  -KUB. Gastric distension concern for gastric out let obstruct vs Ileus.  Plan to continue with fluids, NG tube to suction and blood transfusion as needed.    Essential hypertension: Hold  metoprolol hydralazine amlodipine  Chronic indwelling Foley catheter secondary to BPH: Continue with finasteride and tamsulosin Urine culture growing; Enterococcus  faecalis change to Unasyn.   Chronic DVT on chronic anticoagulation: Diagnosed 2023. Continue with Eliquis  History of seizure disorder: Continue with Keppra  See wound care documentation below  Pressure Injury 05/17/22 Buttocks Right Stage 2 -  Partial thickness loss of dermis presenting as a shallow  open injury with a red, pink wound bed without slough. (Active)  05/17/22 2300  Location: Buttocks  Location Orientation: Right  Staging: Stage 2 -  Partial thickness loss of dermis presenting as a shallow open injury with a red, pink wound bed without slough.  Wound Description (Comments):   Present on Admission: Yes     Pressure Injury 09/07/22 Buttocks Right Stage 2 -  Partial thickness loss of dermis presenting as a shallow open injury with a red, pink wound bed without slough. (Active)  09/07/22 1129  Location: Buttocks  Location Orientation: Right  Staging: Stage 2 -  Partial thickness loss of dermis presenting as a shallow open injury with a red, pink wound bed without slough.  Wound Description (Comments):   Present on Admission: Yes  Dressing Type Foam - Lift dressing to assess site every shift 09/07/22 2000    Estimated body mass index is 21.26 kg/m as calculated from the following:   Height as of 08/01/22: 6' 3.5" (1.918 m).   Weight as of 08/12/22: 78.2 kg.   DVT prophylaxis:scd Code Status: DNR Family Communication: Wife over phone  Disposition Plan:  Status is: Inpatient Remains inpatient appropriate because: management PNA, vomiting , renal failure.     Consultants:  Palliative  Procedures:    Antimicrobials:    Subjective: He keeps eyes close, nod to answer questions. She had NG tube in place, with dark, black liquid.  Out put from NG yesterday 1.9 L.  Objective: Vitals:   09/08/22 0635 09/08/22 0853 09/08/22 1212 09/08/22 1232  BP: 123/66 (!) 130/59 (!) 140/65 (!) 145/66  Pulse: 80 60 60 60  Resp: '16 15 20 18  '$ Temp: 98.9 F (37.2 C) 97.8 F (36.6 C) 98.5 F (36.9 C) 97.9 F (36.6 C)  TempSrc: Axillary Oral Axillary Axillary  SpO2:  100% 100% 100%    Intake/Output Summary (Last 24 hours) at 09/08/2022 1430 Last data filed at 09/08/2022 0900 Gross per 24 hour  Intake 1546.23 ml  Output 2700 ml  Net -1153.77 ml    There were no vitals  filed for this visit.  Examination:  General exam: Chronic ill appearing Respiratory system: No Crackles.  Cardiovascular system: S 1, S 2 RRR Gastrointestinal system: BS present, soft, mild tender Central nervous system: sleepy Extremities: no edema    Data Reviewed: I have personally reviewed following labs and imaging studies  CBC: Recent Labs  Lab 09/06/22 0743 09/07/22 0256 09/08/22 0707  WBC 6.5 7.4 7.6  NEUTROABS 4.3  --   --   HGB 9.5* 8.5* 6.8*  HCT 29.5* 25.6* 21.2*  MCV 89.9 89.8 93.0  PLT 195 181 149*    Basic Metabolic Panel: Recent Labs  Lab 09/06/22 0743 09/07/22 0256 09/08/22 0707  NA 138 144 146*  K 4.4 4.9 4.7  CL 104 110 115*  CO2 '23 25 24  '$ GLUCOSE 129* 140* 153*  BUN 87* 91* 108*  CREATININE 3.64* 3.56* 3.80*  CALCIUM 9.6 9.2 8.9    GFR: CrCl cannot be calculated (Unknown ideal weight.). Liver Function Tests: Recent Labs  Lab 09/06/22 0743  AST 14*  ALT 15  ALKPHOS 71  BILITOT 0.3  PROT 6.2*  ALBUMIN 3.3*    Recent Labs  Lab 09/06/22 0743  LIPASE 40    No results for input(s): "AMMONIA" in the last 168 hours.  Coagulation Profile: No results for input(s): "INR", "PROTIME" in the last 168 hours. Cardiac Enzymes: No results for input(s): "CKTOTAL", "CKMB", "CKMBINDEX", "TROPONINI" in the last 168 hours. BNP (last 3 results) No results for input(s): "PROBNP" in the last 8760 hours. HbA1C: No results for input(s): "HGBA1C" in the last 72 hours. CBG: No results for input(s): "GLUCAP" in the last 168 hours. Lipid Profile: No results for input(s): "CHOL", "HDL", "LDLCALC", "TRIG", "CHOLHDL", "LDLDIRECT" in the last 72 hours. Thyroid Function Tests: No results for input(s): "TSH", "T4TOTAL", "FREET4", "T3FREE", "THYROIDAB" in the last 72 hours. Anemia Panel: No results for input(s): "VITAMINB12", "FOLATE", "FERRITIN", "TIBC", "IRON", "RETICCTPCT" in the last 72 hours. Sepsis Labs: Recent Labs  Lab 09/06/22 0743   PROCALCITON 0.31  LATICACIDVEN 0.9     Recent Results (from the past 240 hour(s))  Urine Culture     Status: Abnormal   Collection Time: 09/06/22  7:49 AM   Specimen: In/Out Cath Urine  Result Value Ref Range Status   Specimen Description IN/OUT CATH URINE  Final   Special Requests   Final    NONE Performed at [facility], [place] [address], [place], [place] [address]    Culture >=100,000 COLONIES/mL ENTEROCOCCUS FAECALIS (A)  Final   Report Status 09/08/2022 FINAL  Final   Organism ID, Bacteria ENTEROCOCCUS FAECALIS (A)  Final      Susceptibility   Enterococcus faecalis - MIC*    AMPICILLIN <=2 SENSITIVE Sensitive     NITROFURANTOIN <=16 SENSITIVE Sensitive     VANCOMYCIN 1 SENSITIVE Sensitive     * >=100,000 COLONIES/mL ENTEROCOCCUS FAECALIS  Resp panel by RT-PCR (RSV, Flu A&B, Covid) Anterior Nasal Swab     Status: None   Collection Time: 09/06/22  7:51 AM   Specimen: Anterior Nasal Swab  Result Value Ref Range Status   SARS Coronavirus 2 by RT PCR NEGATIVE NEGATIVE Final    Comment: (NOTE) SARS-CoV-2 target nucleic acids are NOT DETECTED.  The SARS-CoV-2 RNA is generally detectable in upper respiratory specimens during the acute phase of infection. The lowest concentration of SARS-CoV-2 viral copies this assay can detect is 138 copies/mL. A negative result does not preclude SARS-Cov-2 infection and should not be used as the sole basis for treatment or other patient management decisions. A negative result may occur with  improper specimen collection/handling, submission of specimen other than nasopharyngeal swab, presence of viral mutation(s) within the areas targeted by this assay, and inadequate number of viral copies(<138 copies/mL). A negative result must be combined with clinical observations, patient history, and epidemiological information. The expected result is Negative.  Fact Sheet for Patients:  EntrepreneurPulse.com.au  Fact Sheet  for Healthcare Providers:  IncredibleEmployment.be  This test is no t yet approved or cleared by the [place] FDA and  has been authorized for detection and/or diagnosis of SARS-CoV-2 by FDA under an Emergency Use Authorization (EUA). This EUA will remain  in effect (meaning this test can be used) for the duration of the COVID-19 declaration under Section 564(b)(1) of the Act, 21 U.S.C.section 360bbb-3(b)(1), unless the authorization is terminated  or revoked sooner.       Influenza A by PCR NEGATIVE NEGATIVE Final   Influenza B by PCR NEGATIVE NEGATIVE Final    Comment: (NOTE) The Xpert Xpress SARS-CoV-2/FLU/RSV plus assay is intended as an aid in the diagnosis of influenza from Nasopharyngeal swab specimens and should not be used as a sole basis for treatment. Nasal washings and aspirates are unacceptable for Xpert  Xpress SARS-CoV-2/FLU/RSV testing.  Fact Sheet for Patients: EntrepreneurPulse.com.au  Fact Sheet for Healthcare Providers: IncredibleEmployment.be  This test is not yet approved or cleared by the [place] FDA and has been authorized for detection and/or diagnosis of SARS-CoV-2 by FDA under an Emergency Use Authorization (EUA). This EUA will remain in effect (meaning this test can be used) for the duration of the COVID-19 declaration under Section 564(b)(1) of the Act, 21 U.S.C. section 360bbb-3(b)(1), unless the authorization is terminated or revoked.     Resp Syncytial Virus by PCR NEGATIVE NEGATIVE Final    Comment: (NOTE) Fact Sheet for Patients: EntrepreneurPulse.com.au  Fact Sheet for Healthcare Providers: IncredibleEmployment.be  This test is not yet approved or cleared by the [place] FDA and has been authorized for detection and/or diagnosis of SARS-CoV-2 by FDA under an Emergency Use Authorization (EUA). This EUA will remain in effect (meaning  this test can be used) for the duration of the COVID-19 declaration under Section 564(b)(1) of the Act, 21 U.S.C. section 360bbb-3(b)(1), unless the authorization is terminated or revoked.  Performed at [facility], [place] [address] [place], [place] [address]   Culture, blood (single) w Reflex to ID Panel     Status: None (Preliminary result)   Collection Time: 09/06/22  5:03 PM   Specimen: BLOOD  Result Value Ref Range Status   Specimen Description BLOOD RIGHT ANTECUBITAL  Final   Special Requests   Final    BOTTLES DRAWN AEROBIC AND ANAEROBIC Blood Culture adequate volume   Culture   Final    NO GROWTH 2 DAYS Performed at [facility], [place] [address], [place], [place] [address]    Report Status PENDING  Incomplete         Radiology Studies: CT ABDOMEN PELVIS WO CONTRAST  Result Date: 09/07/2022 CLINICAL DATA:  Bowel obstruction suspected. EXAM: CT ABDOMEN AND PELVIS WITHOUT CONTRAST TECHNIQUE: Multidetector CT imaging of the abdomen and pelvis was performed following the standard protocol without IV contrast. RADIATION DOSE REDUCTION: This exam was performed according to the departmental dose-optimization program which includes automated exposure control, adjustment of the mA and/or kV according to patient size and/or use of iterative reconstruction technique. COMPARISON:  10/07/2021 FINDINGS: Lower chest: Atelectasis or consolidation in the lung bases. Cardiac enlargement. Hepatobiliary: Stable appearance of 1.5 cm diameter low-attenuation lesion in segment 2 of the liver. This is likely a cyst. No imaging follow-up is indicated. No other focal lesions identified on noncontrast examination. Gallbladder and bile ducts are unremarkable. Pancreas: Unremarkable. No pancreatic ductal dilatation or surrounding inflammatory changes. Spleen: Normal in size without focal abnormality. Adrenals/Urinary Tract: Right adrenal gland nodule measuring 1.2 x 2.9 cm diameter. Density  measures 1.3 Hounsfield units, likely indicating a benign fat containing adenoma. No change since prior study. Multiple bilateral exophytic renal lesions, largest in the left lower pole measuring 3.7 cm diameter. These are likely cysts and are unchanged since prior study. Several subcentimeter lesions are hyperintense, likely hemorrhagic. No imaging follow-up is indicated. No hydronephrosis or hydroureter. Bladder is decompressed with a Foley catheter in place. No abnormality is indicated. Stomach/Bowel: Stomach is fluid distended. An enteric tube is present with tip terminating in the body of the stomach. No gastric wall thickening. Small bowel and colon are not abnormally distended. No wall thickening is appreciated. Prominent stool in the rectum without significant rectal thickening. Appendix is normal. Vascular/Lymphatic: Aortic atherosclerosis. No enlarged abdominal or pelvic lymph nodes. Reproductive: Prostate is unremarkable. Other: No abdominal wall hernia  or abnormality. No abdominopelvic ascites. Musculoskeletal: Degenerative changes in the spine and hips. No acute bony abnormalities. IMPRESSION: 1. No evidence of bowel obstruction. 2. Fluid distended stomach with enteric tube in place. This could represent gastric obstruction or gastroparesis. 3. Aortic atherosclerosis. 4. Atelectasis or consolidation in the lung bases. Electronically Signed   By: Lucienne Capers M.D.   On: 09/07/2022 19:39   DG Abd 1 View  Result Date: 09/07/2022 CLINICAL DATA:  NG tube placement EXAM: ABDOMEN - 1 VIEW COMPARISON:  Previous studies including the examination done earlier today FINDINGS: There is interval placement of enteric tube with its tip in the region of body of stomach. There is interval decrease in degree of distention of stomach. There is no significant small bowel dilation. Gas and stool are noted in colon. IMPRESSION: Tip of enteric tube is seen in the region of body of stomach. There is interval decrease  in degree of gastric distention. Electronically Signed   By: Elmer Picker M.D.   On: 09/07/2022 19:26   DG Abd 1 View  Result Date: 09/07/2022 CLINICAL DATA:  Vomiting EXAM: ABDOMEN - 1 VIEW COMPARISON:  06/30/2022 FINDINGS: There is marked distention of stomach. There are gas-filled small bowel loops without significant dilation. Gas and stool are noted in colon. Degenerative changes are noted in lumbar spine. There is increased density in left lower lung field obscuring the left hemidiaphragm. This may be due to infiltrate and possibly pleural effusion in left lower lung field. IMPRESSION: There is marked distention of the stomach. This may be due to ileus or gastric outlet obstruction. Electronically Signed   By: Elmer Picker M.D.   On: 09/07/2022 14:50        Scheduled Meds:  atorvastatin  40 mg Oral Daily   Chlorhexidine Gluconate Cloth  6 each Topical Daily   finasteride  5 mg Oral Daily   metoprolol succinate  50 mg Oral QHS   [START ON 09/12/2022] pantoprazole  40 mg Intravenous Q12H   tamsulosin  0.4 mg Oral Daily   Continuous Infusions:  ampicillin-sulbactam (UNASYN) IV 3 g (09/08/22 1156)   azithromycin 500 mg (09/08/22 1000)   dextrose 5% lactated ringers 100 mL/hr at 09/08/22 1224   levETIRAcetam 500 mg (09/08/22 1219)   pantoprazole     pantoprazole       LOS: 2 days    Time spent: 35 minutes    Juli Odom A Nichole Neyer, MD [facility]   If 7PM-7AM, please contact night-coverage [email or web address]  09/08/2022, 2:30 PM

## 2022-09-08 NOTE — Progress Notes (Signed)
[place]:  I have had the opportunity to meet with this pt and his wife. We had discussion about hospice services at home and there goals. The wife confirms pt is DNR and this is his wishes. We had discussion about feeding tubes if he doesn't doe well with his speech evaluation and she was given the book Making hard Choices. At this time she does want to proceed with hospice referral. She has asked that I order oxygen for the home and I have completed this task. They have all other equipment in home of hospital bed, w/c, hoyer lift, nebulizer, and walkers etc.   Dr. Tyrell Antonio was on the floor and I was able to update her on the goals and pt wishes.   Thank you for this referral we will continue to follow and assist with coordination of care when pt is d/c home.   Webb Silversmith RN [phone]

## 2022-09-08 NOTE — Patient Outreach (Signed)
  Care Coordination   Follow Up Visit Note   09/08/2022 Name: Darrell [name] MRN: [id number] DOB: [birth year]  Darrell [name] is a 71 y.o. year old male who sees [name], MD for primary care. I spoke with  [name] by phone today.  What matters to the patients health and wellness today?  Patient is currently Inpatient at [facility].     Goals Addressed             This Visit's Progress    I want to keep him healthy       Care Coordination Interventions: Reviewed chart in preparation to contact patient/caregiver Noted patient is currently IP at [facility] with admit date: 09/06/22, dx: aspiration pneumonia         SDOH assessments and interventions completed:  No   Care Coordination Interventions:  Yes, provided   Follow up plan: Follow up call scheduled for 09/14/22 '@0930'$  AM     Encounter Outcome:  Pt. Visit Completed

## 2022-09-08 NOTE — Progress Notes (Signed)
Pharmacy Antibiotic Note  Darrell [name] is a 71 y.o. male admitted on 09/06/2022 with shortness of breath. CTAP 12/14 with consolidation vs atelectasis. Pharmacy has been consulted for Unasyn dosing.  Estimated CrCl ~30m/min using weight from 08/12/22 of 78.2kg. Asked RN to get updated weight if able.   Plan: Unasyn 3g IV q12hours F/u new weight and adjust as needed     Temp (24hrs), Avg:98.4 F (36.9 C), Min:97.8 F (36.6 C), Max:98.9 F (37.2 C)  Recent Labs  Lab 09/06/22 0743 09/07/22 0256 09/08/22 0707  WBC 6.5 7.4 7.6  CREATININE 3.64* 3.56*  --   LATICACIDVEN 0.9  --   --     CrCl cannot be calculated (Unknown ideal weight.).    Allergies  Allergen Reactions   Ace Inhibitors Other (See Comments)    Hyperkalemia   Doxycycline Other (See Comments)    Hiccups, cough, nausea and emesis, elevated liver enzymes, elevated eosinophils, SOB concerning for early DRESS syndrome    Atacand Hct [Candesartan Cilexetil-Hctz] Hives   Shellfish Allergy Hives    Antimicrobials this admission: Azithromycin 12/13 >> 12/18 Ceftriaxone 12/13 >> 12/14 Unasyn 12/15 >>  Dose adjustments this admission:  Microbiology results: 12/13 BCx: ngtd 12/13 UCx: >100K Efaecalis (pan-sens)  Thank you for allowing pharmacy to be a part of this patient's care.  MDimple Nanas PharmD, BCPS 09/08/2022 9:17 AM

## 2022-09-09 ENCOUNTER — Inpatient Hospital Stay (HOSPITAL_COMMUNITY): Payer: Medicare HMO

## 2022-09-09 DIAGNOSIS — Z7189 Other specified counseling: Secondary | ICD-10-CM | POA: Diagnosis not present

## 2022-09-09 DIAGNOSIS — J189 Pneumonia, unspecified organism: Secondary | ICD-10-CM | POA: Diagnosis not present

## 2022-09-09 DIAGNOSIS — Z515 Encounter for palliative care: Secondary | ICD-10-CM | POA: Diagnosis not present

## 2022-09-09 LAB — CBC
HCT: 22.1 % — ABNORMAL LOW (ref 39.0–52.0)
Hemoglobin: 7.2 g/dL — ABNORMAL LOW (ref 13.0–17.0)
MCH: 30.3 pg (ref 26.0–34.0)
MCHC: 32.6 g/dL (ref 30.0–36.0)
MCV: 92.9 fL (ref 80.0–100.0)
Platelets: 153 10*3/uL (ref 150–400)
RBC: 2.38 MIL/uL — ABNORMAL LOW (ref 4.22–5.81)
RDW: 14.6 % (ref 11.5–15.5)
WBC: 7.1 10*3/uL (ref 4.0–10.5)
nRBC: 0 % (ref 0.0–0.2)

## 2022-09-09 LAB — BASIC METABOLIC PANEL
Anion gap: 7 (ref 5–15)
BUN: 88 mg/dL — ABNORMAL HIGH (ref 8–23)
CO2: 25 mmol/L (ref 22–32)
Calcium: 9 mg/dL (ref 8.9–10.3)
Chloride: 116 mmol/L — ABNORMAL HIGH (ref 98–111)
Creatinine, Ser: 3.15 mg/dL — ABNORMAL HIGH (ref 0.61–1.24)
GFR, Estimated: 20 mL/min — ABNORMAL LOW (ref >60)
Glucose, Bld: 158 mg/dL — ABNORMAL HIGH (ref 70–99)
Potassium: 4.4 mmol/L (ref 3.5–5.1)
Sodium: 148 mmol/L — ABNORMAL HIGH (ref 135–145)

## 2022-09-09 LAB — HEMOGLOBIN AND HEMATOCRIT, BLOOD
HCT: 22 % — ABNORMAL LOW (ref 39.0–52.0)
HCT: 27.3 % — ABNORMAL LOW (ref 39.0–52.0)
Hemoglobin: 7.2 g/dL — ABNORMAL LOW (ref 13.0–17.0)
Hemoglobin: 8.6 g/dL — ABNORMAL LOW (ref 13.0–17.0)

## 2022-09-09 LAB — PREPARE RBC (CROSSMATCH)

## 2022-09-09 MED ORDER — MINERAL OIL RE ENEM
1.0000 | ENEMA | Freq: Once | RECTAL | Status: AC
  Administered 2022-09-09: 1 via RECTAL
  Filled 2022-09-09: qty 1

## 2022-09-09 MED ORDER — SODIUM CHLORIDE 0.9% IV SOLUTION: Freq: Once | INTRAVENOUS | Status: AC

## 2022-09-09 MED ORDER — LATANOPROST 0.005 % OP SOLN
1.0000 [drp] | Freq: Every day | OPHTHALMIC | Status: DC
  Administered 2022-09-09 – 2022-09-15 (×7): 1 [drp] via OPHTHALMIC
  Filled 2022-09-09: qty 2.5

## 2022-09-09 MED ORDER — DORZOLAMIDE HCL-TIMOLOL MAL 2-0.5 % OP SOLN
1.0000 [drp] | Freq: Every day | OPHTHALMIC | Status: DC
  Administered 2022-09-10 – 2022-09-16 (×7): 1 [drp] via OPHTHALMIC
  Filled 2022-09-09: qty 10

## 2022-09-09 MED ORDER — BRIMONIDINE TARTRATE 0.2 % OP SOLN
1.0000 [drp] | Freq: Every day | OPHTHALMIC | Status: DC
  Administered 2022-09-10 – 2022-09-16 (×7): 1 [drp] via OPHTHALMIC
  Filled 2022-09-09 (×3): qty 5

## 2022-09-09 MED ORDER — BISACODYL 10 MG RE SUPP
10.0000 mg | Freq: Once | RECTAL | Status: AC
  Administered 2022-09-09: 10 mg via RECTAL
  Filled 2022-09-09: qty 1

## 2022-09-09 NOTE — Progress Notes (Signed)
PROGRESS NOTE    Darrell [name]  URK:[id number] DOB: 16-Nov-1950 DOA: 09/06/2022 PCP: [name], MD   Brief Narrative: 71 year old with past medical history significant for hypertension, hyperlipidemia, CVA with residual left-sided weakness, ALS, chronic dysphagia, CKD stage IV, DVT on Eliquis, BPH with chronic Foley catheter presents with shortness of breath.  History obtained from wife.  At baseline patient is bedbound not on oxygen, requires assistance with all activities.  He vomited twice the day of admission.  Intermittent cough.  On EMS arrival patient oxygen sat was found to be 80% placed on nonrebreather.  Subsequently he was transitioned to 2 L of oxygen in the ED.  Patient was found to have AKI, chest x-ray with left lung base compatible with pneumonia.  Patient admitted with AKI, vomiting, pneumonia.  Patient with poor prognosis, palliative care has been consulted.  He has had more than 6 hospitalization in the last 6 months.  Family and patient discussed with palliative, Continue with IV fluids, IV antibiotics, Blood transfusion as needed, and reassess condition 12/16. No plan for endoscopy  Assessment & Plan:   Principal Problem:   Aspiration pneumonia ([place]) Active Problems:   Acute respiratory failure with hypoxia ([name])   History of CVA with residual deficit   Dysphagia   Acute kidney injury superimposed on chronic kidney disease ([name])   Anemia, chronic disease   Essential hypertension   Chronic indwelling Foley catheter   Chronic deep vein thrombosis (DVT) ([name])   Chronic anticoagulation   Seizure disorder ([name])   DNR (do not resuscitate)   Pressure injury of skin   1-Acute hypoxic respiratory failure possible secondary to aspiration pneumonia: Presented with hypoxia, oxygen saturation down to 80%, chest x-ray with left-sided pneumonia, he had 2 episode of vomiting, worsening shortness of breath. -Continue IV ceftriaxone and azithromycin -Blood cultures no  growth to date. -RSV, COVID-19, influenza: Negative -Continue with flutter valve -Procalcitonin 0.3 -Palliative care consulted and following.  Patient wishes to go home with hospice care.   -plan to continue with IV antibiotics, fluids for Now.    2-Chronic dysphagia, history of CVA with residual deficit Acute on chronic encephalopathy Aspiration precaution NPO, due to vomiting.  If Family wishes to proceed with  hospice care approach could consider pured diet, when vomiting resolved.   3-AKI on CKD stage IV: Patient creatinine baseline 2.4--2.5 Cr  peaked to 3.6 on admission. Suspect hypovolemia in the setting of vomiting and poor oral intake Continue with IV fluid. Worsening renal function today.  He is not good candidate for HD, he is bed bound, has recurrent aspiration.  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  Hb 8.5---9.5--6.8--7.2 Related to Upper GI loss, bleed.  Received one unit PRBC 12/15 Plan to repeat another unit 12/16  5-Vomiting/ black content: Gastric Outlet obstruction vs gastroparesis.  -NPO -Continue with IV fluids. -IV protonix  change to gtt.  -Hold Eliquis.  -KUB. Gastric distension concern for gastric out let obstruct vs Ileus.  Plan to continue with fluids, NG tube to suction and blood transfusion as needed.  -Almost 2 L fluid obtain from NGT on insertion.  He has 200 cc caniter since yesterday.  Repeated KUB; unable to evaluate distention of stomach, large stool ball in the recutm. Will proceed with enema and suppository  Essential hypertension: Hold  metoprolol  hydralazine amlodipine  Chronic indwelling Foley catheter secondary to BPH: Continue with finasteride and tamsulosin Urine culture growing; Enterococcus  faecalis change to Unasyn.   Chronic DVT on chronic  anticoagulation: Diagnosed 2023. Continue with Eliquis  History of seizure disorder: Continue with Keppra  See wound care documentation below  Pressure Injury 05/17/22 Buttocks Right Stage 2 -  Partial thickness loss of dermis presenting as a shallow open injury with a red, pink wound bed without slough. (Active)  05/17/22 2300  Location: Buttocks  Location Orientation: Right  Staging: Stage 2 -  Partial thickness loss of dermis presenting as a shallow open injury with a red, pink wound bed without slough.  Wound Description (Comments):   Present on Admission: Yes     Pressure Injury 09/07/22 Buttocks Right Stage 2 -  Partial thickness loss of dermis presenting as a shallow open injury with a red, pink wound bed without slough. (Active)  09/07/22 1129  Location: Buttocks  Location Orientation: Right  Staging: Stage 2 -  Partial thickness loss of dermis presenting as a shallow open injury with a red, pink wound bed without slough.  Wound Description (Comments):   Present on Admission: Yes  Dressing Type Foam - Lift dressing to assess site every shift 09/09/22 0800    Estimated body mass index is 21.26 kg/m as calculated from the following:   Height as of 08/01/22: 6' 3.5" (1.918 m).   Weight as of 08/12/22: 78.2 kg.   DVT prophylaxis:scd Code Status: DNR Family Communication: Wife  at bedside Disposition Plan:  Status is: Inpatient Remains inpatient appropriate because: management PNA, vomiting , renal failure.     Consultants:  Palliative  Procedures:    Antimicrobials:    Subjective: He is sleepy. Would say yes or no to questions.  Denies pain Discussed with wife, plan to continue with fluids, blood transfusion as needed.   Objective: Vitals:   09/09/22 0804 09/09/22 1231 09/09/22 1250 09/09/22 1527  BP: (!) 143/73 (!) 165/76 (!) 165/68 (!) 173/78  Pulse: (!) 59 60 62 60  Resp: '18 18 18 17  '$ Temp: 98.2 F (36.8 C) 97.9 F (36.6 C) 98 F (36.7 C) 97.8 F  (36.6 C)  TempSrc: Oral Axillary Axillary   SpO2: 99% 100% 100% 100%    Intake/Output Summary (Last 24 hours) at 09/09/2022 1532 Last data filed at 09/09/2022 1046 Gross per 24 hour  Intake 3078.51 ml  Output 1750 ml  Net 1328.51 ml    There were no vitals filed for this visit.  Examination:  General exam: Chronic ill appearing Respiratory system: CTA Cardiovascular system: S 1, S 2 RRR Gastrointestinal system: BS present, soft, nt Central nervous system: sleepy  Extremities: no edema    Data Reviewed: I have personally reviewed following labs and imaging studies  CBC: Recent Labs  Lab 09/06/22 0743 09/07/22 0256 09/08/22 0707 09/08/22 1903 09/09/22 0225 09/09/22 0230  WBC 6.5 7.4 7.6  --  7.1  --   NEUTROABS 4.3  --   --   --   --   --   HGB 9.5* 8.5* 6.8* 7.4* 7.2* 7.2*  HCT 29.5* 25.6* 21.2* 23.3* 22.1* 22.0*  MCV 89.9 89.8 93.0  --  92.9  --   PLT 195 181 149*  --  153  --     Basic Metabolic Panel: Recent Labs  Lab 09/06/22 0743 09/07/22 0256 09/08/22 0707 09/09/22 0225  NA 138 144 146* 148*  K 4.4 4.9 4.7 4.4  CL  104 110 115* 116*  CO2 '23 25 24 25  '$ GLUCOSE 129* 140* 153* 158*  BUN 87* 91* 108* 88*  CREATININE 3.64* 3.56* 3.80* 3.15*  CALCIUM 9.6 9.2 8.9 9.0    GFR: CrCl cannot be calculated (Unknown ideal weight.). Liver Function Tests: Recent Labs  Lab 09/06/22 0743  AST 14*  ALT 15  ALKPHOS 71  BILITOT 0.3  PROT 6.2*  ALBUMIN 3.3*    Recent Labs  Lab 09/06/22 0743  LIPASE 40    No results for input(s): "AMMONIA" in the last 168 hours. Coagulation Profile: No results for input(s): "INR", "PROTIME" in the last 168 hours. Cardiac Enzymes: No results for input(s): "CKTOTAL", "CKMB", "CKMBINDEX", "TROPONINI" in the last 168 hours. BNP (last 3 results) No results for input(s): "PROBNP" in the last 8760 hours. HbA1C: No results for input(s): "HGBA1C" in the last 72 hours. CBG: No results for input(s): "GLUCAP" in the last  168 hours. Lipid Profile: No results for input(s): "CHOL", "HDL", "LDLCALC", "TRIG", "CHOLHDL", "LDLDIRECT" in the last 72 hours. Thyroid Function Tests: No results for input(s): "TSH", "T4TOTAL", "FREET4", "T3FREE", "THYROIDAB" in the last 72 hours. Anemia Panel: No results for input(s): "VITAMINB12", "FOLATE", "FERRITIN", "TIBC", "IRON", "RETICCTPCT" in the last 72 hours. Sepsis Labs: Recent Labs  Lab 09/06/22 0743  PROCALCITON 0.31  LATICACIDVEN 0.9     Recent Results (from the past 240 hour(s))  Urine Culture     Status: Abnormal   Collection Time: 09/06/22  7:49 AM   Specimen: In/Out Cath Urine  Result Value Ref Range Status   Specimen Description IN/OUT CATH URINE  Final   Special Requests   Final    NONE Performed at [facility], [place] [address], [place], [place] [address]    Culture >=100,000 COLONIES/mL ENTEROCOCCUS FAECALIS (A)  Final   Report Status 09/08/2022 FINAL  Final   Organism ID, Bacteria ENTEROCOCCUS FAECALIS (A)  Final      Susceptibility   Enterococcus faecalis - MIC*    AMPICILLIN <=2 SENSITIVE Sensitive     NITROFURANTOIN <=16 SENSITIVE Sensitive     VANCOMYCIN 1 SENSITIVE Sensitive     * >=100,000 COLONIES/mL ENTEROCOCCUS FAECALIS  Resp panel by RT-PCR (RSV, Flu A&B, Covid) Anterior Nasal Swab     Status: None   Collection Time: 09/06/22  7:51 AM   Specimen: Anterior Nasal Swab  Result Value Ref Range Status   SARS Coronavirus 2 by RT PCR NEGATIVE NEGATIVE Final    Comment: (NOTE) SARS-CoV-2 target nucleic acids are NOT DETECTED.  The SARS-CoV-2 RNA is generally detectable in upper respiratory specimens during the acute phase of infection. The lowest concentration of SARS-CoV-2 viral copies this assay can detect is 138 copies/mL. A negative result does not preclude SARS-Cov-2 infection and should not be used as the sole basis for treatment or other patient management decisions. A negative result may occur with  improper specimen  collection/handling, submission of specimen other than nasopharyngeal swab, presence of viral mutation(s) within the areas targeted by this assay, and inadequate number of viral copies(<138 copies/mL). A negative result must be combined with clinical observations, patient history, and epidemiological information. The expected result is Negative.  Fact Sheet for Patients:  EntrepreneurPulse.com.au  Fact Sheet for Healthcare Providers:  IncredibleEmployment.be  This test is no t yet approved or cleared by the [place] FDA and  has been authorized for detection and/or diagnosis of SARS-CoV-2 by FDA under an Emergency Use Authorization (EUA). This EUA will remain  in effect (  meaning this test can be used) for the duration of the COVID-19 declaration under Section 564(b)(1) of the Act, 21 U.S.C.section 360bbb-3(b)(1), unless the authorization is terminated  or revoked sooner.       Influenza A by PCR NEGATIVE NEGATIVE Final   Influenza B by PCR NEGATIVE NEGATIVE Final    Comment: (NOTE) The Xpert Xpress SARS-CoV-2/FLU/RSV plus assay is intended as an aid in the diagnosis of influenza from Nasopharyngeal swab specimens and should not be used as a sole basis for treatment. Nasal washings and aspirates are unacceptable for Xpert Xpress SARS-CoV-2/FLU/RSV testing.  Fact Sheet for Patients: EntrepreneurPulse.com.au  Fact Sheet for Healthcare Providers: IncredibleEmployment.be  This test is not yet approved or cleared by the [place] FDA and has been authorized for detection and/or diagnosis of SARS-CoV-2 by FDA under an Emergency Use Authorization (EUA). This EUA will remain in effect (meaning this test can be used) for the duration of the COVID-19 declaration under Section 564(b)(1) of the Act, 21 U.S.C. section 360bbb-3(b)(1), unless the authorization is terminated or revoked.     Resp Syncytial  Virus by PCR NEGATIVE NEGATIVE Final    Comment: (NOTE) Fact Sheet for Patients: EntrepreneurPulse.com.au  Fact Sheet for Healthcare Providers: IncredibleEmployment.be  This test is not yet approved or cleared by the [place] FDA and has been authorized for detection and/or diagnosis of SARS-CoV-2 by FDA under an Emergency Use Authorization (EUA). This EUA will remain in effect (meaning this test can be used) for the duration of the COVID-19 declaration under Section 564(b)(1) of the Act, 21 U.S.C. section 360bbb-3(b)(1), unless the authorization is terminated or revoked.  Performed at [facility], [place] [address] [place], [place] [address]   Culture, blood (single) w Reflex to ID Panel     Status: None (Preliminary result)   Collection Time: 09/06/22  5:03 PM   Specimen: BLOOD  Result Value Ref Range Status   Specimen Description BLOOD RIGHT ANTECUBITAL  Final   Special Requests   Final    BOTTLES DRAWN AEROBIC AND ANAEROBIC Blood Culture adequate volume   Culture   Final    NO GROWTH 3 DAYS Performed at [facility], [place] [address], [place], [place] [address]    Report Status PENDING  Incomplete         Radiology Studies: DG Abd Portable 1V  Result Date: 09/09/2022 CLINICAL DATA:  Gastric outlet obstruction EXAM: PORTABLE ABDOMEN - 1 VIEW COMPARISON:  September 07, 2022 FINDINGS: The NG tube terminates in the stomach. Persistent opacity in left base obscuring left hemidiaphragm. Prominent stool ball in the rectum. No other significant abnormalities. IMPRESSION: 1. The NG tube terminates in the stomach. 2. Prominent stool ball in the rectum. 3. Persistent opacity in left base. Electronically Signed   By: Dorise Bullion III M.D.   On: 09/09/2022 14:44   CT ABDOMEN PELVIS WO CONTRAST  Result Date: 09/07/2022 CLINICAL DATA:  Bowel obstruction suspected. EXAM: CT ABDOMEN AND PELVIS WITHOUT CONTRAST TECHNIQUE:  Multidetector CT imaging of the abdomen and pelvis was performed following the standard protocol without IV contrast. RADIATION DOSE REDUCTION: This exam was performed according to the departmental dose-optimization program which includes automated exposure control, adjustment of the mA and/or kV according to patient size and/or use of iterative reconstruction technique. COMPARISON:  10/07/2021 FINDINGS: Lower chest: Atelectasis or consolidation in the lung bases. Cardiac enlargement. Hepatobiliary: Stable appearance of 1.5 cm diameter low-attenuation lesion in segment 2 of the liver. This is likely a  cyst. No imaging follow-up is indicated. No other focal lesions identified on noncontrast examination. Gallbladder and bile ducts are unremarkable. Pancreas: Unremarkable. No pancreatic ductal dilatation or surrounding inflammatory changes. Spleen: Normal in size without focal abnormality. Adrenals/Urinary Tract: Right adrenal gland nodule measuring 1.2 x 2.9 cm diameter. Density measures 1.3 Hounsfield units, likely indicating a benign fat containing adenoma. No change since prior study. Multiple bilateral exophytic renal lesions, largest in the left lower pole measuring 3.7 cm diameter. These are likely cysts and are unchanged since prior study. Several subcentimeter lesions are hyperintense, likely hemorrhagic. No imaging follow-up is indicated. No hydronephrosis or hydroureter. Bladder is decompressed with a Foley catheter in place. No abnormality is indicated. Stomach/Bowel: Stomach is fluid distended. An enteric tube is present with tip terminating in the body of the stomach. No gastric wall thickening. Small bowel and colon are not abnormally distended. No wall thickening is appreciated. Prominent stool in the rectum without significant rectal thickening. Appendix is normal. Vascular/Lymphatic: Aortic atherosclerosis. No enlarged abdominal or pelvic lymph nodes. Reproductive: Prostate is unremarkable. Other: No  abdominal wall hernia or abnormality. No abdominopelvic ascites. Musculoskeletal: Degenerative changes in the spine and hips. No acute bony abnormalities. IMPRESSION: 1. No evidence of bowel obstruction. 2. Fluid distended stomach with enteric tube in place. This could represent gastric obstruction or gastroparesis. 3. Aortic atherosclerosis. 4. Atelectasis or consolidation in the lung bases. Electronically Signed   By: Lucienne Capers M.D.   On: 09/07/2022 19:39   DG Abd 1 View  Result Date: 09/07/2022 CLINICAL DATA:  NG tube placement EXAM: ABDOMEN - 1 VIEW COMPARISON:  Previous studies including the examination done earlier today FINDINGS: There is interval placement of enteric tube with its tip in the region of body of stomach. There is interval decrease in degree of distention of stomach. There is no significant small bowel dilation. Gas and stool are noted in colon. IMPRESSION: Tip of enteric tube is seen in the region of body of stomach. There is interval decrease in degree of gastric distention. Electronically Signed   By: Elmer Picker M.D.   On: 09/07/2022 19:26        Scheduled Meds:  atorvastatin  40 mg Oral Daily   Chlorhexidine Gluconate Cloth  6 each Topical Daily   finasteride  5 mg Oral Daily   metoprolol succinate  50 mg Oral QHS   [START ON 09/12/2022] pantoprazole  40 mg Intravenous Q12H   tamsulosin  0.4 mg Oral Daily   Continuous Infusions:  ampicillin-sulbactam (UNASYN) IV 3 g (09/09/22 1120)   azithromycin 500 mg (09/09/22 1001)   dextrose 5% lactated ringers 125 mL/hr at 09/09/22 1120   levETIRAcetam 500 mg (09/09/22 0955)   pantoprazole 8 mg/hr (09/09/22 1400)     LOS: 3 days    Time spent: 35 minutes    Karynn Deblasi A Sheila Ocasio, MD [facility]   If 7PM-7AM, please contact night-coverage [email or web address]  09/09/2022, 3:32 PM

## 2022-09-09 NOTE — Progress Notes (Signed)
   Palliative Medicine Inpatient Follow Up Note   HPI: Darrell [name] is a 71 y.o. male with medical history significant of hypertension, hyperlipidemia, CVA with residual left-sided weakness, ALS, chronic dysphagia, CKD stage IV, DVT on Eliquis, BPH on chronic Foley catheter who presents with complaints of shortness of breath. He has suffered multiple aspiration PNA's and has had (+)6 IP admissions in the past 6 months. He is followed OP by [facility].    PMT saw Darrell [name] in October of this year at which time he and his wife were not ready to accept hospice care.   Today's Discussion 09/09/2022  *Please note that this is a verbal dictation therefore any spelling or grammatical errors are due to the "[employer]" system interpretation.  Chart reviewed inclusive of vital signs, progress notes, laboratory results, and diagnostic images.   I met with Darrell [name] and his spouse, Darrell [name] at bedside this afternoon.  We discussed what has occurred since I last saw them inclusive of nasogastric tube placement for decompression in the setting of possible gastric outlet obstruction versus gastroparesis.  To date patient has had about 2-1/2 L of contents removed via nasal suction.  Upon assessment his abdomen is soft and nontender.  He does not grimace or wince when his abdomen is palpated deeply.  This with patient's spouse patient present measures to manage prior to transition home with patient's spouse would like he is stable as.  She does confirm she would like to continue IV fluids, antibiotics, and  blood transfusions at this time.  Plan to continue with nasogastric suctioning until output has decreased significantly.  Created space and opportunity for Darrell [name] to explore thoughts feelings and fears regarding current medical situation.  Darrell [name] is in agreement with the patient transitioning home with hospice after medically optimized.  Darrell [name] has met with her and  discussed their program which she is in alignment with.  Questions and concerns addressed/Palliative Support Provided.   Objective Assessment: Vital Signs Vitals:   09/08/22 2056 09/09/22 0804  BP: (!) 140/68 (!) 143/73  Pulse: 60 (!) 59  Resp: 16 18  Temp: 98.2 F (36.8 C) 98.2 F (36.8 C)  SpO2: 100% 99%    Intake/Output Summary (Last 24 hours) at 09/09/2022 1223 Last data filed at 09/09/2022 1046 Gross per 24 hour  Intake 3078.51 ml  Output 1750 ml  Net 1328.51 ml   Gen:  [place] AA M in NAD HEENT: NG tube in place, dry mucous membranes CV: Regular rate and rhythm  PULM:  On 2LPM [place], scattered rhonchi ABD: soft/nontender  EXT: No edema  Neuro: Alert and oriented x3 - slow to respond (+) hypophonia  SUMMARY OF RECOMMENDATIONS   - DNR - Continue IVF, antibiotics, blood transfusions for now - Plan to medically optimize as best as possible prior to transitioning home with hospice -Patient's spouse has met with [facility] the [name] and is in agreement with their program - Ongoing palliative care support  Billing based on MDM: High ______________________________________________________________________________________ [place] Team Team Cell Phone: [phone] Please utilize secure chat with additional questions, if there is no response within 30 minutes please call the above phone number  Palliative Medicine Team providers are available by phone from 7am to 7pm daily and can be reached through the team cell phone.  Should this patient require assistance outside of these hours, please call the patient's attending physician.

## 2022-09-09 NOTE — Plan of Care (Signed)
  Problem: Clinical Measurements: Goal: Will remain free from infection Outcome: Progressing   Problem: Clinical Measurements: Goal: Diagnostic test results will improve Outcome: Progressing   Problem: Nutrition: Goal: Adequate nutrition will be maintained Outcome: Progressing

## 2022-09-10 DIAGNOSIS — J189 Pneumonia, unspecified organism: Secondary | ICD-10-CM | POA: Diagnosis not present

## 2022-09-10 DIAGNOSIS — Z515 Encounter for palliative care: Secondary | ICD-10-CM | POA: Diagnosis not present

## 2022-09-10 LAB — TYPE AND SCREEN
ABO/RH(D): AB POS
Antibody Screen: NEGATIVE
Unit division: 0
Unit division: 0

## 2022-09-10 LAB — BPAM RBC
Blood Product Expiration Date: 202401152359
Blood Product Expiration Date: 202401152359
ISSUE DATE / TIME: 202312151202
ISSUE DATE / TIME: 202312161215
Unit Type and Rh: 8400
Unit Type and Rh: 8400

## 2022-09-10 LAB — CBC
HCT: 26.4 % — ABNORMAL LOW (ref 39.0–52.0)
Hemoglobin: 8.6 g/dL — ABNORMAL LOW (ref 13.0–17.0)
MCH: 30.2 pg (ref 26.0–34.0)
MCHC: 32.6 g/dL (ref 30.0–36.0)
MCV: 92.6 fL (ref 80.0–100.0)
Platelets: 163 10*3/uL (ref 150–400)
RBC: 2.85 MIL/uL — ABNORMAL LOW (ref 4.22–5.81)
RDW: 14.6 % (ref 11.5–15.5)
WBC: 7.8 10*3/uL (ref 4.0–10.5)
nRBC: 0 % (ref 0.0–0.2)

## 2022-09-10 LAB — BASIC METABOLIC PANEL
Anion gap: 5 (ref 5–15)
BUN: 65 mg/dL — ABNORMAL HIGH (ref 8–23)
CO2: 25 mmol/L (ref 22–32)
Calcium: 9.2 mg/dL (ref 8.9–10.3)
Chloride: 121 mmol/L — ABNORMAL HIGH (ref 98–111)
Creatinine, Ser: 2.61 mg/dL — ABNORMAL HIGH (ref 0.61–1.24)
GFR, Estimated: 26 mL/min — ABNORMAL LOW (ref >60)
Glucose, Bld: 161 mg/dL — ABNORMAL HIGH (ref 70–99)
Potassium: 3.8 mmol/L (ref 3.5–5.1)
Sodium: 151 mmol/L — ABNORMAL HIGH (ref 135–145)

## 2022-09-10 MED ORDER — DEXTROSE 5 % IV SOLN: INTRAVENOUS | Status: DC

## 2022-09-10 MED ORDER — MINERAL OIL RE ENEM
1.0000 | ENEMA | Freq: Once | RECTAL | Status: AC
  Administered 2022-09-10: 1 via RECTAL
  Filled 2022-09-10: qty 1

## 2022-09-10 MED ORDER — LACTATED RINGERS IV BOLUS
250.0000 mL | Freq: Once | INTRAVENOUS | Status: AC
  Administered 2022-09-10: 250 mL via INTRAVENOUS

## 2022-09-10 NOTE — Progress Notes (Signed)
PROGRESS NOTE    Darrell [name]  ESP:[id number] DOB: 1951/06/10 DOA: 09/06/2022 PCP: [name], MD   Brief Narrative: 71 year old with past medical history significant for hypertension, hyperlipidemia, CVA with residual left-sided weakness, ALS, chronic dysphagia, CKD stage IV, DVT on Eliquis, BPH with chronic Foley catheter presents with shortness of breath.  History obtained from wife.  At baseline patient is bedbound not on oxygen, requires assistance with all activities.  He vomited twice the day of admission.  Intermittent cough.  On EMS arrival patient oxygen sat was found to be 80% placed on nonrebreather.  Subsequently he was transitioned to 2 L of oxygen in the ED.  Patient was found to have AKI, chest x-ray with left lung base compatible with pneumonia.  Patient admitted with AKI, vomiting, pneumonia.  Patient with poor prognosis, palliative care has been consulted.  He has had more than 6 hospitalization in the last 6 months.  Family and patient discussed with palliative, Continue with IV fluids, IV antibiotics, Blood transfusion as needed, and reassess condition 12/16. No plan for endoscopy.  Assessment & Plan:   Principal Problem:   Aspiration pneumonia ([place]) Active Problems:   Acute respiratory failure with hypoxia ([place])   History of CVA with residual deficit   Dysphagia   Acute kidney injury superimposed on chronic kidney disease ([name])   Anemia, chronic disease   Essential hypertension   Chronic indwelling Foley catheter   Chronic deep vein thrombosis (DVT) ([name])   Chronic anticoagulation   Seizure disorder ([name])   DNR (do not resuscitate)   Pressure injury of skin   1-Acute hypoxic respiratory failure possible secondary to aspiration pneumonia: Presented with hypoxia, oxygen saturation down to 80%, chest x-ray with left-sided pneumonia, he had 2 episode of vomiting, worsening shortness of breath. -On IV Unasyn, day 4 antibiotics.  -Blood cultures no growth to  date. -RSV, COVID-19, influenza: Negative -Continue with flutter valve -Procalcitonin 0.3 -Palliative care consulted and following.  Patient wishes to go home with hospice care.   -Plan to continue with IV antibiotics, fluids for Now.    2-Chronic dysphagia, history of CVA with residual deficit Acute on chronic encephalopathy Aspiration precaution NPO, due to vomiting.  If Family wishes to proceed with  hospice care approach could consider pured diet, when vomiting resolved.   3-AKI on CKD stage IV: Patient creatinine baseline 2.4--2.5 Cr  peaked to 3.6 on admission. Suspect hypovolemia in the setting of vomiting and poor oral intake He is not good candidate for HD, he is bed bound, has recurrent aspiration.  Cr down to 2.6 close to baseline.  Continue with IV fluids.   Hypernatremia; Increase IV fluids, sodium increasing. IV bolus.    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  Hb 8.5---9.5--6.8--7.2---8.6 Related to Upper GI loss, bleed.  Received total 2 unit PRBC 12/15 and second on 12/16 Hb stable. NG tube out no black anymore.   5-Vomiting/ black content: Gastric Outlet obstruction vs gastroparesis.  Constipation.  -NPO -Continue with IV fluids. -IV protonix  gtt.  -Hold Eliquis.  -KUB. Gastric distension concern for gastric out let obstruct vs Ileus.  Plan to continue with fluids, NG tube to suction and blood transfusion as needed.  -Almost 2 L fluid obtain from NGT on insertion.  -600 cc from NG tube on 12/16 -Repeated KUB; unable to evaluate distention of stomach, large stool ball in the recutm.  Had small BM yesterday, moderate large BM today. Plan to repeat mineral oil enema.  Plan to repeat CT abdomen pelvis.    Essential hypertension: Hold  metoprolol hydralazine amlodipine  Chronic indwelling Foley catheter secondary to BPH: Continue with  finasteride and tamsulosin Urine culture growing; Enterococcus  faecalis change to Unasyn.   Chronic DVT on chronic anticoagulation: Diagnosed 2023. Continue with Eliquis  History of seizure disorder: Continue with Keppra  See wound care documentation below  Pressure Injury 05/17/22 Buttocks Right Stage 2 -  Partial thickness loss of dermis presenting as a shallow open injury with a red, pink wound bed without slough. (Active)  05/17/22 2300  Location: Buttocks  Location Orientation: Right  Staging: Stage 2 -  Partial thickness loss of dermis presenting as a shallow open injury with a red, pink wound bed without slough.  Wound Description (Comments):   Present on Admission: Yes     Pressure Injury 09/07/22 Buttocks Right Stage 2 -  Partial thickness loss of dermis presenting as a shallow open injury with a red, pink wound bed without slough. (Active)  09/07/22 1129  Location: Buttocks  Location Orientation: Right  Staging: Stage 2 -  Partial thickness loss of dermis presenting as a shallow open injury with a red, pink wound bed without slough.  Wound Description (Comments):   Present on Admission: Yes  Dressing Type None 09/09/22 2119    Estimated body mass index is 21.26 kg/m as calculated from the following:   Height as of 08/01/22: 6' 3.5" (1.918 m).   Weight as of 08/12/22: 78.2 kg.   DVT prophylaxis:scd Code Status: DNR Family Communication: Wife  at bedside Disposition Plan:  Status is: Inpatient Remains inpatient appropriate because: management PNA, vomiting , renal failure.     Consultants:  Palliative  Procedures:    Antimicrobials:    Subjective: Keeps eyes close, denies pain, abdomen feels better.  He had 600 cc from NG tube yesterday.  He had moderate BM today.   Objective: Vitals:   09/09/22 1757 09/09/22 2119 09/10/22 0518 09/10/22 0752  BP: (!) 167/77 (!) 155/83 (!) 145/87 (!) 154/79  Pulse: 60 61 68 73  Resp:  '18 19 17  '$ Temp:  97.7 F  (36.5 C) 98.1 F (36.7 C) 98 F (36.7 C)  TempSrc:  Oral Oral   SpO2:  100% 100% 100%    Intake/Output Summary (Last 24 hours) at 09/10/2022 1041 Last data filed at 09/10/2022 0700 Gross per 24 hour  Intake 4310.3 ml  Output 2500 ml  Net 1810.3 ml    There were no vitals filed for this visit.  Examination:  General exam: Chronic ill appearing.  Respiratory system: CTA Cardiovascular system: S 1, S 2 RRR Gastrointestinal system: BS present, soft, nt Central nervous system: sleepy.  Extremities: No edema    Data Reviewed: I have personally reviewed following labs and imaging studies  CBC: Recent Labs  Lab 09/06/22 0743 09/07/22 0256 09/08/22 0707 09/08/22 1903 09/09/22 0225 09/09/22 0230 09/09/22 2134 09/10/22 0209  WBC 6.5 7.4 7.6  --  7.1  --   --  7.8  NEUTROABS 4.3  --   --   --   --   --   --   --   HGB 9.5* 8.5* 6.8* 7.4* 7.2* 7.2* 8.6* 8.6*  HCT 29.5* 25.6* 21.2* 23.3* 22.1* 22.0* 27.3* 26.4*  MCV 89.9 89.8 93.0  --  92.9  --   --  92.6  PLT 195 181 149*  --  153  --   --  144    Basic Metabolic Panel: Recent Labs  Lab 09/06/22 0743 09/07/22 0256 09/08/22 0707 09/09/22 0225 09/10/22 0209  NA 138 144 146* 148* 151*  K 4.4 4.9 4.7 4.4 3.8  CL 104 110 115* 116* 121*  CO2 '23 25 24 25 25  '$ GLUCOSE 129* 140* 153* 158* 161*  BUN 87* 91* 108* 88* 65*  CREATININE 3.64* 3.56* 3.80* 3.15* 2.61*  CALCIUM 9.6 9.2 8.9 9.0 9.2    GFR: CrCl cannot be calculated (Unknown ideal weight.). Liver Function Tests: Recent Labs  Lab 09/06/22 0743  AST 14*  ALT 15  ALKPHOS 71  BILITOT 0.3  PROT 6.2*  ALBUMIN 3.3*    Recent Labs  Lab 09/06/22 0743  LIPASE 40    No results for input(s): "AMMONIA" in the last 168 hours. Coagulation Profile: No results for input(s): "INR", "PROTIME" in the last 168 hours. Cardiac Enzymes: No results for input(s): "CKTOTAL", "CKMB", "CKMBINDEX", "TROPONINI" in the last 168 hours. BNP (last 3 results) No results for  input(s): "PROBNP" in the last 8760 hours. HbA1C: No results for input(s): "HGBA1C" in the last 72 hours. CBG: No results for input(s): "GLUCAP" in the last 168 hours. Lipid Profile: No results for input(s): "CHOL", "HDL", "LDLCALC", "TRIG", "CHOLHDL", "LDLDIRECT" in the last 72 hours. Thyroid Function Tests: No results for input(s): "TSH", "T4TOTAL", "FREET4", "T3FREE", "THYROIDAB" in the last 72 hours. Anemia Panel: No results for input(s): "VITAMINB12", "FOLATE", "FERRITIN", "TIBC", "IRON", "RETICCTPCT" in the last 72 hours. Sepsis Labs: Recent Labs  Lab 09/06/22 0743  PROCALCITON 0.31  LATICACIDVEN 0.9     Recent Results (from the past 240 hour(s))  Urine Culture     Status: Abnormal   Collection Time: 09/06/22  7:49 AM   Specimen: In/Out Cath Urine  Result Value Ref Range Status   Specimen Description IN/OUT CATH URINE  Final   Special Requests   Final    NONE Performed at [facility], [place] [address] [place], [place] [address]    Culture >=100,000 COLONIES/mL ENTEROCOCCUS FAECALIS (A)  Final   Report Status 09/08/2022 FINAL  Final   Organism ID, Bacteria ENTEROCOCCUS FAECALIS (A)  Final      Susceptibility   Enterococcus faecalis - MIC*    AMPICILLIN <=2 SENSITIVE Sensitive     NITROFURANTOIN <=16 SENSITIVE Sensitive     VANCOMYCIN 1 SENSITIVE Sensitive     * >=100,000 COLONIES/mL ENTEROCOCCUS FAECALIS  Resp panel by RT-PCR (RSV, Flu A&B, Covid) Anterior Nasal Swab     Status: None   Collection Time: 09/06/22  7:51 AM   Specimen: Anterior Nasal Swab  Result Value Ref Range Status   SARS Coronavirus 2 by RT PCR NEGATIVE NEGATIVE Final    Comment: (NOTE) SARS-CoV-2 target nucleic acids are NOT DETECTED.  The SARS-CoV-2 RNA is generally detectable in upper respiratory specimens during the acute phase of infection. The lowest concentration of SARS-CoV-2 viral copies this assay can detect is 138 copies/mL. A negative result does not preclude  SARS-Cov-2 infection and should not be used as the sole basis for treatment or other patient management decisions. A negative result may occur with  improper specimen collection/handling, submission of specimen other than nasopharyngeal swab, presence of viral mutation(s) within the areas targeted by this assay, and inadequate number of viral copies(<138 copies/mL). A negative result must be combined with clinical observations, patient history, and epidemiological information. The expected result is Negative.  Fact Sheet for Patients:  EntrepreneurPulse.com.au  Fact Sheet for Healthcare Providers:  IncredibleEmployment.be  This test is no t yet approved or cleared by the [place] FDA and  has been authorized for detection and/or diagnosis of SARS-CoV-2 by FDA under an Emergency Use Authorization (EUA). This EUA will remain  in effect (meaning this test can be used) for the duration of the COVID-19 declaration under Section 564(b)(1) of the Act, 21 U.S.C.section 360bbb-3(b)(1), unless the authorization is terminated  or revoked sooner.       Influenza A by PCR NEGATIVE NEGATIVE Final   Influenza B by PCR NEGATIVE NEGATIVE Final    Comment: (NOTE) The Xpert Xpress SARS-CoV-2/FLU/RSV plus assay is intended as an aid in the diagnosis of influenza from Nasopharyngeal swab specimens and should not be used as a sole basis for treatment. Nasal washings and aspirates are unacceptable for Xpert Xpress SARS-CoV-2/FLU/RSV testing.  Fact Sheet for Patients: EntrepreneurPulse.com.au  Fact Sheet for Healthcare Providers: IncredibleEmployment.be  This test is not yet approved or cleared by the [place] FDA and has been authorized for detection and/or diagnosis of SARS-CoV-2 by FDA under an Emergency Use Authorization (EUA). This EUA will remain in effect (meaning this test can be used) for the duration of  the COVID-19 declaration under Section 564(b)(1) of the Act, 21 U.S.C. section 360bbb-3(b)(1), unless the authorization is terminated or revoked.     Resp Syncytial Virus by PCR NEGATIVE NEGATIVE Final    Comment: (NOTE) Fact Sheet for Patients: EntrepreneurPulse.com.au  Fact Sheet for Healthcare Providers: IncredibleEmployment.be  This test is not yet approved or cleared by the [place] FDA and has been authorized for detection and/or diagnosis of SARS-CoV-2 by FDA under an Emergency Use Authorization (EUA). This EUA will remain in effect (meaning this test can be used) for the duration of the COVID-19 declaration under Section 564(b)(1) of the Act, 21 U.S.C. section 360bbb-3(b)(1), unless the authorization is terminated or revoked.  Performed at [facility], [place] [address] [place], [place] [address]   Culture, blood (single) w Reflex to ID Panel     Status: None (Preliminary result)   Collection Time: 09/06/22  5:03 PM   Specimen: BLOOD  Result Value Ref Range Status   Specimen Description BLOOD RIGHT ANTECUBITAL  Final   Special Requests   Final    BOTTLES DRAWN AEROBIC AND ANAEROBIC Blood Culture adequate volume   Culture   Final    NO GROWTH 4 DAYS Performed at [facility], [place] [address], [place], [place] [address]    Report Status PENDING  Incomplete         Radiology Studies: DG Abd Portable 1V  Result Date: 09/09/2022 CLINICAL DATA:  Gastric outlet obstruction EXAM: PORTABLE ABDOMEN - 1 VIEW COMPARISON:  September 07, 2022 FINDINGS: The NG tube terminates in the stomach. Persistent opacity in left base obscuring left hemidiaphragm. Prominent stool ball in the rectum. No other significant abnormalities. IMPRESSION: 1. The NG tube terminates in the stomach. 2. Prominent stool ball in the rectum. 3. Persistent opacity in left base. Electronically Signed   By: Dorise Bullion III M.D.   On: 09/09/2022 14:44         Scheduled Meds:  atorvastatin  40 mg Oral Daily   brimonidine  1 drop Both Eyes Daily   Chlorhexidine Gluconate Cloth  6 each Topical Daily   dorzolamide-timolol  1 drop Both Eyes Daily   finasteride  5 mg Oral Daily   latanoprost  1 drop Both Eyes  QHS   metoprolol succinate  50 mg Oral QHS   mineral oil  1 enema Rectal Once   [START ON 09/12/2022] pantoprazole  40 mg Intravenous Q12H   tamsulosin  0.4 mg Oral Daily   Continuous Infusions:  ampicillin-sulbactam (UNASYN) IV 3 g (09/10/22 1026)   azithromycin 500 mg (09/10/22 1024)   dextrose     lactated ringers     levETIRAcetam 500 mg (09/10/22 0950)   pantoprazole 8 mg/hr (09/10/22 0700)     LOS: 4 days    Time spent: 35 minutes    Bonham Zingale A Alexus Michael, MD [facility]   If 7PM-7AM, please contact night-coverage [email or web address]  09/10/2022, 10:41 AM

## 2022-09-10 NOTE — Progress Notes (Signed)
   Palliative Medicine Inpatient Follow Up Note   HPI: Darrell [name] is a 71 y.o. male with medical history significant of hypertension, hyperlipidemia, CVA with residual left-sided weakness, ALS, chronic dysphagia, CKD stage IV, DVT on Eliquis, BPH on chronic Foley catheter who presents with complaints of shortness of breath. He has suffered multiple aspiration PNA's and has had (+)6 IP admissions in the past 6 months. He is followed OP by [facility].    PMT saw Darrell [name] in October of this year at which time he and his wife were not ready to accept hospice care.   Today's Discussion 09/10/2022  *Please note that this is a verbal dictation therefore any spelling or grammatical errors are due to the "[employer]" system interpretation.  Chart reviewed inclusive of vital signs, progress notes, laboratory results, and diagnostic images. Hgb/Hct stable. Na high. Cr improved.   I met with Darrell [name] and his spouse, Darrell [name] at bedside this late morning. We reviewed that his NGT output is decreasing. His abdomen is soft and non-tender.   Darrell [name] is minimally vocal today. His wife shares that the medical team feel if he keeps improving he will likely be able to transition home sooner than later. We reviewed the hope for his output to continue to decrease and for him to get home midweek with hospice care.   Plan to continue current measures for the time being.  Patients wife does not want to "rush" and is willing to waiting until he is identified as stable to go home.   Questions and concerns addressed/Palliative Support Provided.   Objective Assessment: Vital Signs Vitals:   09/10/22 0518 09/10/22 0752  BP: (!) 145/87 (!) 154/79  Pulse: 68 73  Resp: 19 17  Temp: 98.1 F (36.7 C) 98 F (36.7 C)  SpO2: 100% 100%    Intake/Output Summary (Last 24 hours) at 09/10/2022 1534 Last data filed at 09/10/2022 1312 Gross per 24 hour  Intake 5471.7 ml  Output 2100 ml  Net  3371.7 ml    Gen:  [place] AA M in NAD HEENT: NG tube in place, dry mucous membranes CV: Regular rate and rhythm  PULM:  On 2LPM [place], scattered rhonchi ABD: soft/nontender  EXT: No edema  Neuro: Alert and oriented x3 - slow to respond (+) hypophonia  SUMMARY OF RECOMMENDATIONS   - DNR - Continue NGT for decompression - Continue IVF, antibiotics, blood transfusions for now - Plan to medically optimize as best as possible prior to transitioning home with hospice -Patient's spouse has met with [facility] [name] and is in agreement with their program - Ongoing palliative care support  Billing based on MDM: High ______________________________________________________________________________________ [place] Team Team Cell Phone: [phone] Please utilize secure chat with additional questions, if there is no response within 30 minutes please call the above phone number  Palliative Medicine Team providers are available by phone from 7am to 7pm daily and can be reached through the team cell phone.  Should this patient require assistance outside of these hours, please call the patient's attending physician.

## 2022-09-10 NOTE — Progress Notes (Signed)
SLP Cancellation Note  Patient Details Name: Darrell [name] MRN: [id number] DOB: October 06, 1950   Cancelled treatment:     Pt is still NPO with NG for suctioning.  His diet PTA was puree/dysphagia 1 and honey-thick liquids. SLP will follow along for readiness.  Darrell Spillers L. Tivis Ringer, MA CCC/SLP Clinical Specialist - Acute Care SLP Acute Rehabilitation Services Office number [phone]       Darrell [name] 09/10/2022, 1:15 PM

## 2022-09-10 NOTE — Plan of Care (Signed)
  Problem: Clinical Measurements: Goal: Will remain free from infection Outcome: Progressing   Problem: Clinical Measurements: Goal: Diagnostic test results will improve Outcome: Progressing   Problem: Clinical Measurements: Goal: Respiratory complications will improve Outcome: Progressing   Problem: Elimination: Goal: Will not experience complications related to bowel motility Outcome: Progressing

## 2022-09-11 ENCOUNTER — Inpatient Hospital Stay (HOSPITAL_COMMUNITY): Payer: Medicare HMO

## 2022-09-11 DIAGNOSIS — J189 Pneumonia, unspecified organism: Secondary | ICD-10-CM | POA: Diagnosis not present

## 2022-09-11 LAB — BASIC METABOLIC PANEL
Anion gap: 5 (ref 5–15)
BUN: 49 mg/dL — ABNORMAL HIGH (ref 8–23)
CO2: 24 mmol/L (ref 22–32)
Calcium: 9.1 mg/dL (ref 8.9–10.3)
Chloride: 118 mmol/L — ABNORMAL HIGH (ref 98–111)
Creatinine, Ser: 2.38 mg/dL — ABNORMAL HIGH (ref 0.61–1.24)
GFR, Estimated: 29 mL/min — ABNORMAL LOW (ref >60)
Glucose, Bld: 137 mg/dL — ABNORMAL HIGH (ref 70–99)
Potassium: 3.6 mmol/L (ref 3.5–5.1)
Sodium: 147 mmol/L — ABNORMAL HIGH (ref 135–145)

## 2022-09-11 LAB — CULTURE, BLOOD (SINGLE)
Culture: NO GROWTH
Special Requests: ADEQUATE

## 2022-09-11 LAB — CBC
HCT: 25.3 % — ABNORMAL LOW (ref 39.0–52.0)
Hemoglobin: 8 g/dL — ABNORMAL LOW (ref 13.0–17.0)
MCH: 29.7 pg (ref 26.0–34.0)
MCHC: 31.6 g/dL (ref 30.0–36.0)
MCV: 94.1 fL (ref 80.0–100.0)
Platelets: 141 10*3/uL — ABNORMAL LOW (ref 150–400)
RBC: 2.69 MIL/uL — ABNORMAL LOW (ref 4.22–5.81)
RDW: 15 % (ref 11.5–15.5)
WBC: 8.4 10*3/uL (ref 4.0–10.5)
nRBC: 0.2 % (ref 0.0–0.2)

## 2022-09-11 MED ORDER — ACETAMINOPHEN 10 MG/ML IV SOLN
1000.0000 mg | Freq: Once | INTRAVENOUS | Status: AC
  Administered 2022-09-12: 1000 mg via INTRAVENOUS
  Filled 2022-09-11: qty 100

## 2022-09-11 MED ORDER — BISACODYL 10 MG RE SUPP
10.0000 mg | Freq: Once | RECTAL | Status: AC
  Administered 2022-09-11: 10 mg via RECTAL
  Filled 2022-09-11 (×2): qty 1

## 2022-09-11 MED ORDER — SORBITOL 70 % SOLN
960.0000 mL | TOPICAL_OIL | Freq: Once | ORAL | Status: AC
  Administered 2022-09-11: 960 mL via RECTAL
  Filled 2022-09-11: qty 240

## 2022-09-11 NOTE — Progress Notes (Signed)
Palliative:  Chart reviewed. Noted some signs of improvement. Plans for home with hospice when medically optimized with NGT, IVF, antibiotics, blood transfusions. Plans to resume dysphagia I and honey-thick liquid diet when NGT removed. They have already connected with [facility] [name]. Time for outcomes. Continue conservative treatment for now. Palliative will continue to follow.   No charge  Vinie Sill, NP Palliative Medicine Team Pager [phone] (Please see [email or web address] for schedule) Team Phone [phone]

## 2022-09-11 NOTE — Consult Note (Signed)
[name]: Referral Follow Up: Wife declined delivery of DME today since pt is still hospitalized. Will continue to follow hospital course but will need to re-order DME and have another goals of care discussion prior to discharge with hospice as per Webb Silversmith, RN's meeting last week, wife seems to be back and forth with goals of care and hopeful for improvement. If I can assist in any way please call Sharalyn Ink, RN at [phone]

## 2022-09-11 NOTE — Plan of Care (Signed)

## 2022-09-11 NOTE — Plan of Care (Signed)

## 2022-09-11 NOTE — Consult Note (Signed)
   [facility] CM Inpatient Consult   09/11/2022  KENG JEWEL 1951-05-29 [id number]  [place] [facility] [ACO] Patient: Darrell [name] Medicare  Primary Care Provider:  [name], MD, [facility]  Patient is currently active with [facility] for chronic disease management services.  Patient has been engaged by a [facility] RNCM and [facility] Social Worker noted.  Our community based plan of care has focused on disease management and community resource support.    Patient is currently being recommended for home with hospice care  Plan: Home with [facility] is noted per Palliative Consult. Will sign off at transition from the hospital.  For additional questions or referrals please contact:  Natividad Brood, RN BSN [place]  [phone] business mobile phone Toll free office [phone]  *[place]  [phone] Fax number: [phone] [email or web address]

## 2022-09-11 NOTE — Progress Notes (Signed)
PROGRESS NOTE    Darrell [name]  JQZ:[id number] DOB: 05-19-1951 DOA: 09/06/2022 PCP: [name], MD   Brief Narrative: 71 year old with past medical history significant for hypertension, hyperlipidemia, CVA with residual left-sided weakness, ALS, chronic dysphagia, CKD stage IV, DVT on Eliquis, BPH with chronic Foley catheter presents with shortness of breath.  History obtained from wife.  At baseline patient is bedbound not on oxygen, requires assistance with all activities.  He vomited twice the day of admission.  Intermittent cough.  On EMS arrival patient oxygen sat was found to be 80% placed on nonrebreather.  Subsequently he was transitioned to 2 L of oxygen in the ED.  Patient was found to have AKI, chest x-ray with left lung base compatible with pneumonia.  Patient admitted with AKI, vomiting, pneumonia.  Patient with poor prognosis, palliative care has been consulted.  He has had more than 6 hospitalization in the last 6 months.  Family and patient discussed with palliative, Continue with IV fluids, IV antibiotics, Blood transfusion as needed, and reassess condition 12/16. No plan for endoscopy.  Assessment & Plan:   Principal Problem:   Aspiration pneumonia ([place]) Active Problems:   Acute respiratory failure with hypoxia ([place])   History of CVA with residual deficit   Dysphagia   Acute kidney injury superimposed on chronic kidney disease ([name])   Anemia, chronic disease   Essential hypertension   Chronic indwelling Foley catheter   Chronic deep vein thrombosis (DVT) ([name])   Chronic anticoagulation   Seizure disorder ([name])   DNR (do not resuscitate)   Pressure injury of skin   1-Acute hypoxic respiratory failure possible secondary to aspiration pneumonia: Presented with hypoxia, oxygen saturation down to 80%, chest x-ray with left-sided pneumonia, he had 2 episode of vomiting, worsening shortness of breath. -On IV Unasyn, day 5 antibiotics.  -Blood cultures no growth to  date. -RSV, COVID-19, influenza: Negative -Continue with flutter valve -Procalcitonin 0.3 -Palliative care consulted and following.  Patient wishes to go home with hospice care.   -Plan to continue with IV antibiotics, fluids for Now.    2-Chronic dysphagia, history of CVA with residual deficit Acute on chronic encephalopathy Aspiration precaution NPO, due to vomiting.  If Family wishes to proceed with  hospice care approach could consider pured diet, when vomiting resolved.   3-AKI on CKD stage IV: Patient creatinine baseline 2.4--2.5 Cr  peaked to 3.6 on admission. Suspect hypovolemia in the setting of vomiting and poor oral intake He is not good candidate for HD, he is bed bound, has recurrent aspiration.  Cr down to 2.3 close to baseline.  Continue with IV fluids.   Hypernatremia; Improved with IV fluids. 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  Hb 8.5---9.5--6.8--7.2---8.6 Related to Upper GI loss, bleed.  Received total 2 unit PRBC 12/15 and second on 12/16 Hb stable. NG tube out no black anymore.   5-Vomiting/ black content: Gastric Outlet obstruction vs gastroparesis.  Constipation.  -NPO -Continue with IV fluids. -IV protonix  gtt.  -Hold Eliquis.  -KUB. Gastric distension concern for gastric out let obstruct vs Ileus.  Plan to continue with fluids, NG tube to suction and blood transfusion as needed.  -Almost 2 L fluid obtain from NGT on insertion.  -600 cc from NG tube on 12/16 NG out put: 250 on 12/17.  -Repeated KUB: Unable to evaluate distention  of stomach, Large stool ball in the recutm. -had 2 large BM yesterday and today.  -CT abdomen pelvis: Stomach decompress, Large amount of stool in the rectum has slightly decreased in amount when compared to the prior study. There is new rectosigmoid wall thickening with  marked inflammatory stranding surrounding the rectum worrisome for stercoral colitis. Plan to repeat Enema today.  Hopefully we can remove NG tube tomorrow.   Essential hypertension: Hold  metoprolol hydralazine amlodipine  Chronic indwelling Foley catheter secondary to BPH: Continue with finasteride and tamsulosin Urine culture growing; Enterococcus  faecalis change to Unasyn.   Chronic DVT on chronic anticoagulation: Diagnosed 2023. Continue with Eliquis  History of seizure disorder: Continue with Keppra  See wound care documentation below  Pressure Injury 05/17/22 Buttocks Right Stage 2 -  Partial thickness loss of dermis presenting as a shallow open injury with a red, pink wound bed without slough. (Active)  05/17/22 2300  Location: Buttocks  Location Orientation: Right  Staging: Stage 2 -  Partial thickness loss of dermis presenting as a shallow open injury with a red, pink wound bed without slough.  Wound Description (Comments):   Present on Admission: Yes     Pressure Injury 09/07/22 Buttocks Right Stage 2 -  Partial thickness loss of dermis presenting as a shallow open injury with a red, pink wound bed without slough. (Active)  09/07/22 1129  Location: Buttocks  Location Orientation: Right  Staging: Stage 2 -  Partial thickness loss of dermis presenting as a shallow open injury with a red, pink wound bed without slough.  Wound Description (Comments):   Present on Admission: Yes  Dressing Type None 09/11/22 0913    Estimated body mass index is 21.26 kg/m as calculated from the following:   Height as of 08/01/22: 6' 3.5" (1.918 m).   Weight as of 08/12/22: 78.2 kg.   DVT prophylaxis:scd Code Status: DNR Family Communication: Wife  at bedside Disposition Plan:  Status is: Inpatient Remains inpatient appropriate because: management PNA, vomiting , renal failure.     Consultants:  Palliative  Procedures:    Antimicrobials:    Subjective: He denies  abdominal pain. He had BM earlier today. After enema he had BM.   Objective: Vitals:   09/10/22 1540 09/10/22 2002 09/11/22 0600 09/11/22 0803  BP: (!) 142/74 123/70 (!) 105/54 (!) 161/81  Pulse: 60 65 79 60  Resp: '18 18 18 16  '$ Temp: 98.2 F (36.8 C) 98.4 F (36.9 C) 98.1 F (36.7 C) 98.4 F (36.9 C)  TempSrc:  Oral Oral Axillary  SpO2: 100% 100% 100% 97%    Intake/Output Summary (Last 24 hours) at 09/11/2022 1539 Last data filed at 09/11/2022 1517 Gross per 24 hour  Intake 2200.78 ml  Output 800 ml  Net 1400.78 ml    There were no vitals filed for this visit.  Examination:  General exam: Chronic ill Appearing Respiratory system: CTA Cardiovascular system: S 1, S 2 RRR Gastrointestinal system: BS present, soft, nt Central nervous system: Alert, follows command Extremities: No edema    Data Reviewed: I have personally reviewed following labs and imaging studies  CBC: Recent Labs  Lab 09/06/22 0743 09/07/22 0256 09/08/22 0707 09/08/22 1903 09/09/22 0225 09/09/22 0230 09/09/22 2134 09/10/22 0209 09/11/22 0343  WBC 6.5 7.4 7.6  --  7.1  --   --  7.8 8.4  NEUTROABS 4.3  --   --   --   --   --   --   --   --  HGB 9.5* 8.5* 6.8*   < > 7.2* 7.2* 8.6* 8.6* 8.0*  HCT 29.5* 25.6* 21.2*   < > 22.1* 22.0* 27.3* 26.4* 25.3*  MCV 89.9 89.8 93.0  --  92.9  --   --  92.6 94.1  PLT 195 181 149*  --  153  --   --  163 141*   < > = values in this interval not displayed.    Basic Metabolic Panel: Recent Labs  Lab 09/07/22 0256 09/08/22 0707 09/09/22 0225 09/10/22 0209 09/11/22 0343  NA 144 146* 148* 151* 147*  K 4.9 4.7 4.4 3.8 3.6  CL 110 115* 116* 121* 118*  CO2 '25 24 25 25 24  '$ GLUCOSE 140* 153* 158* 161* 137*  BUN 91* 108* 88* 65* 49*  CREATININE 3.56* 3.80* 3.15* 2.61* 2.38*  CALCIUM 9.2 8.9 9.0 9.2 9.1    GFR: CrCl cannot be calculated (Unknown ideal weight.). Liver Function Tests: Recent Labs  Lab 09/06/22 0743  AST 14*  ALT 15  ALKPHOS 71   BILITOT 0.3  PROT 6.2*  ALBUMIN 3.3*    Recent Labs  Lab 09/06/22 0743  LIPASE 40    No results for input(s): "AMMONIA" in the last 168 hours. Coagulation Profile: No results for input(s): "INR", "PROTIME" in the last 168 hours. Cardiac Enzymes: No results for input(s): "CKTOTAL", "CKMB", "CKMBINDEX", "TROPONINI" in the last 168 hours. BNP (last 3 results) No results for input(s): "PROBNP" in the last 8760 hours. HbA1C: No results for input(s): "HGBA1C" in the last 72 hours. CBG: No results for input(s): "GLUCAP" in the last 168 hours. Lipid Profile: No results for input(s): "CHOL", "HDL", "LDLCALC", "TRIG", "CHOLHDL", "LDLDIRECT" in the last 72 hours. Thyroid Function Tests: No results for input(s): "TSH", "T4TOTAL", "FREET4", "T3FREE", "THYROIDAB" in the last 72 hours. Anemia Panel: No results for input(s): "VITAMINB12", "FOLATE", "FERRITIN", "TIBC", "IRON", "RETICCTPCT" in the last 72 hours. Sepsis Labs: Recent Labs  Lab 09/06/22 0743  PROCALCITON 0.31  LATICACIDVEN 0.9     Recent Results (from the past 240 hour(s))  Urine Culture     Status: Abnormal   Collection Time: 09/06/22  7:49 AM   Specimen: In/Out Cath Urine  Result Value Ref Range Status   Specimen Description IN/OUT CATH URINE  Final   Special Requests   Final    NONE Performed at [facility], [place] [address] [place], [place] [address]    Culture >=100,000 COLONIES/mL ENTEROCOCCUS FAECALIS (A)  Final   Report Status 09/08/2022 FINAL  Final   Organism ID, Bacteria ENTEROCOCCUS FAECALIS (A)  Final      Susceptibility   Enterococcus faecalis - MIC*    AMPICILLIN <=2 SENSITIVE Sensitive     NITROFURANTOIN <=16 SENSITIVE Sensitive     VANCOMYCIN 1 SENSITIVE Sensitive     * >=100,000 COLONIES/mL ENTEROCOCCUS FAECALIS  Resp panel by RT-PCR (RSV, Flu A&B, Covid) Anterior Nasal Swab     Status: None   Collection Time: 09/06/22  7:51 AM   Specimen: Anterior Nasal Swab  Result Value Ref Range  Status   SARS Coronavirus 2 by RT PCR NEGATIVE NEGATIVE Final    Comment: (NOTE) SARS-CoV-2 target nucleic acids are NOT DETECTED.  The SARS-CoV-2 RNA is generally detectable in upper respiratory specimens during the acute phase of infection. The lowest concentration of SARS-CoV-2 viral copies this assay can detect is 138 copies/mL. A negative result does not preclude SARS-Cov-2 infection and should not be used as the sole basis for treatment or  other patient management decisions. A negative result may occur with  improper specimen collection/handling, submission of specimen other than nasopharyngeal swab, presence of viral mutation(s) within the areas targeted by this assay, and inadequate number of viral copies(<138 copies/mL). A negative result must be combined with clinical observations, patient history, and epidemiological information. The expected result is Negative.  Fact Sheet for Patients:  EntrepreneurPulse.com.au  Fact Sheet for Healthcare Providers:  IncredibleEmployment.be  This test is no t yet approved or cleared by the [place] FDA and  has been authorized for detection and/or diagnosis of SARS-CoV-2 by FDA under an Emergency Use Authorization (EUA). This EUA will remain  in effect (meaning this test can be used) for the duration of the COVID-19 declaration under Section 564(b)(1) of the Act, 21 U.S.C.section 360bbb-3(b)(1), unless the authorization is terminated  or revoked sooner.       Influenza A by PCR NEGATIVE NEGATIVE Final   Influenza B by PCR NEGATIVE NEGATIVE Final    Comment: (NOTE) The Xpert Xpress SARS-CoV-2/FLU/RSV plus assay is intended as an aid in the diagnosis of influenza from Nasopharyngeal swab specimens and should not be used as a sole basis for treatment. Nasal washings and aspirates are unacceptable for Xpert Xpress SARS-CoV-2/FLU/RSV testing.  Fact Sheet for  Patients: EntrepreneurPulse.com.au  Fact Sheet for Healthcare Providers: IncredibleEmployment.be  This test is not yet approved or cleared by the [place] FDA and has been authorized for detection and/or diagnosis of SARS-CoV-2 by FDA under an Emergency Use Authorization (EUA). This EUA will remain in effect (meaning this test can be used) for the duration of the COVID-19 declaration under Section 564(b)(1) of the Act, 21 U.S.C. section 360bbb-3(b)(1), unless the authorization is terminated or revoked.     Resp Syncytial Virus by PCR NEGATIVE NEGATIVE Final    Comment: (NOTE) Fact Sheet for Patients: EntrepreneurPulse.com.au  Fact Sheet for Healthcare Providers: IncredibleEmployment.be  This test is not yet approved or cleared by the [place] FDA and has been authorized for detection and/or diagnosis of SARS-CoV-2 by FDA under an Emergency Use Authorization (EUA). This EUA will remain in effect (meaning this test can be used) for the duration of the COVID-19 declaration under Section 564(b)(1) of the Act, 21 U.S.C. section 360bbb-3(b)(1), unless the authorization is terminated or revoked.  Performed at [facility], [place] [address] [place], [place] [address]   Culture, blood (single) w Reflex to ID Panel     Status: None   Collection Time: 09/06/22  5:03 PM   Specimen: BLOOD  Result Value Ref Range Status   Specimen Description BLOOD RIGHT ANTECUBITAL  Final   Special Requests   Final    BOTTLES DRAWN AEROBIC AND ANAEROBIC Blood Culture adequate volume   Culture   Final    NO GROWTH 5 DAYS Performed at [facility], [place] [address], [place], [place] [address]    Report Status 09/11/2022 FINAL  Final         Radiology Studies: CT ABDOMEN PELVIS WO CONTRAST  Result Date: 09/11/2022 CLINICAL DATA:  Bowel obstruction suspected. EXAM: CT ABDOMEN AND PELVIS WITHOUT CONTRAST  TECHNIQUE: Multidetector CT imaging of the abdomen and pelvis was performed following the standard protocol without IV contrast. RADIATION DOSE REDUCTION: This exam was performed according to the departmental dose-optimization program which includes automated exposure control, adjustment of the mA and/or kV according to patient size and/or use of iterative reconstruction technique. COMPARISON:  CT abdomen and pelvis 09/07/2022 FINDINGS: Lower chest: There  are small bilateral pleural effusions. There is airspace disease in the left lung base. There has been no significant interval change. Heart is enlarged. Hepatobiliary: Gallstones are present. No focal liver lesions are identified. No obvious biliary ductal dilatation. Pancreas: Unremarkable. No pancreatic ductal dilatation or surrounding inflammatory changes. Spleen: Normal in size without focal abnormality. Adrenals/Urinary Tract: The bladder is decompressed by Foley catheter. There is no hydronephrosis. Bilateral renal cysts, some of which are complex with calcifications, appear unchanged from the prior study. The adrenal glands are within normal limits. Stomach/Bowel: There is a large amount of stool in the rectum which has slightly decreased in amount when compared to the prior study. There is new rectosigmoid wall thickening with marked inflammatory stranding surrounding the rectum. There is sigmoid colon diverticulosis. There is no evidence for bowel obstruction, pneumatosis or free air. Appendix is not seen. Nasogastric tube tip is in the mid stomach. The stomach is now decompressed. Vascular/Lymphatic: Aortic atherosclerosis. No enlarged abdominal or pelvic lymph nodes. Reproductive: Prostate is unremarkable. Other: There is no ascites or focal abdominal wall hernia. There is mild body wall edema, unchanged. Musculoskeletal: Degenerative changes affect the lumbar spine IMPRESSION: 1. Large amount of stool in the rectum has slightly decreased in amount when  compared to the prior study. There is new rectosigmoid wall thickening with marked inflammatory stranding surrounding the rectum worrisome for stercoral colitis. 2. No evidence for bowel obstruction. 3. Cholelithiasis. 4. Stable small bilateral pleural effusions with left lower lobe airspace disease. 5. Stable mild body wall edema. 6. Nasogastric tube tip is in the mid stomach. The stomach is now decompressed. Aortic Atherosclerosis (ICD10-I70.0). Electronically Signed   By: Ronney Asters M.D.   On: 09/11/2022 01:37        Scheduled Meds:  atorvastatin  40 mg Oral Daily   brimonidine  1 drop Both Eyes Daily   Chlorhexidine Gluconate Cloth  6 each Topical Daily   dorzolamide-timolol  1 drop Both Eyes Daily   finasteride  5 mg Oral Daily   latanoprost  1 drop Both Eyes QHS   metoprolol succinate  50 mg Oral QHS   [START ON 09/12/2022] pantoprazole  40 mg Intravenous Q12H   tamsulosin  0.4 mg Oral Daily   Continuous Infusions:  ampicillin-sulbactam (UNASYN) IV 3 g (09/11/22 0911)   dextrose 125 mL/hr at 09/11/22 0648   levETIRAcetam 500 mg (09/11/22 0905)     LOS: 5 days    Time spent: 35 minutes    Andris Brothers A [place] Rosamond, MD [facility]   If 7PM-7AM, please contact night-coverage [email or web address]  09/11/2022, 3:39 PM

## 2022-09-12 ENCOUNTER — Ambulatory Visit: Payer: Medicare HMO | Admitting: Family Medicine

## 2022-09-12 DIAGNOSIS — Z7189 Other specified counseling: Secondary | ICD-10-CM | POA: Diagnosis not present

## 2022-09-12 DIAGNOSIS — Z515 Encounter for palliative care: Secondary | ICD-10-CM | POA: Diagnosis not present

## 2022-09-12 DIAGNOSIS — G1221 Amyotrophic lateral sclerosis: Secondary | ICD-10-CM | POA: Diagnosis not present

## 2022-09-12 DIAGNOSIS — J189 Pneumonia, unspecified organism: Secondary | ICD-10-CM | POA: Diagnosis not present

## 2022-09-12 LAB — CBC
HCT: 24.4 % — ABNORMAL LOW (ref 39.0–52.0)
Hemoglobin: 7.9 g/dL — ABNORMAL LOW (ref 13.0–17.0)
MCH: 29.8 pg (ref 26.0–34.0)
MCHC: 32.4 g/dL (ref 30.0–36.0)
MCV: 92.1 fL (ref 80.0–100.0)
Platelets: 139 10*3/uL — ABNORMAL LOW (ref 150–400)
RBC: 2.65 MIL/uL — ABNORMAL LOW (ref 4.22–5.81)
RDW: 15.1 % (ref 11.5–15.5)
WBC: 8.1 10*3/uL (ref 4.0–10.5)
nRBC: 0 % (ref 0.0–0.2)

## 2022-09-12 LAB — BASIC METABOLIC PANEL
Anion gap: 7 (ref 5–15)
BUN: 38 mg/dL — ABNORMAL HIGH (ref 8–23)
CO2: 25 mmol/L (ref 22–32)
Calcium: 9.1 mg/dL (ref 8.9–10.3)
Chloride: 114 mmol/L — ABNORMAL HIGH (ref 98–111)
Creatinine, Ser: 2.22 mg/dL — ABNORMAL HIGH (ref 0.61–1.24)
GFR, Estimated: 31 mL/min — ABNORMAL LOW (ref >60)
Glucose, Bld: 119 mg/dL — ABNORMAL HIGH (ref 70–99)
Potassium: 3.3 mmol/L — ABNORMAL LOW (ref 3.5–5.1)
Sodium: 146 mmol/L — ABNORMAL HIGH (ref 135–145)

## 2022-09-12 LAB — MAGNESIUM: Magnesium: 1.4 mg/dL — ABNORMAL LOW (ref 1.7–2.4)

## 2022-09-12 MED ORDER — METOCLOPRAMIDE HCL 5 MG/ML IJ SOLN
5.0000 mg | Freq: Three times a day (TID) | INTRAMUSCULAR | Status: AC
  Administered 2022-09-12 – 2022-09-14 (×8): 5 mg via INTRAVENOUS
  Filled 2022-09-12 (×8): qty 2

## 2022-09-12 MED ORDER — POTASSIUM CHLORIDE 10 MEQ/100ML IV SOLN
10.0000 meq | INTRAVENOUS | Status: AC
  Administered 2022-09-12 (×3): 10 meq via INTRAVENOUS
  Filled 2022-09-12 (×3): qty 100

## 2022-09-12 MED ORDER — SODIUM CHLORIDE 0.9 % IV SOLN
3.0000 g | Freq: Three times a day (TID) | INTRAVENOUS | Status: DC
  Administered 2022-09-12 – 2022-09-13 (×3): 3 g via INTRAVENOUS
  Filled 2022-09-12 (×3): qty 8

## 2022-09-12 MED ORDER — MAGNESIUM SULFATE 2 GM/50ML IV SOLN
2.0000 g | Freq: Once | INTRAVENOUS | Status: AC
  Administered 2022-09-12: 2 g via INTRAVENOUS
  Filled 2022-09-12: qty 50

## 2022-09-12 NOTE — Progress Notes (Signed)
PROGRESS NOTE    Darrell [name]  FVC:[id number] DOB: 05/24/1951 DOA: 09/06/2022 PCP: [name], MD   Brief Narrative: 71 year old with past medical history significant for hypertension, hyperlipidemia, CVA with residual left-sided weakness, ALS, chronic dysphagia, CKD stage IV, DVT on Eliquis, BPH with chronic Foley catheter presents with shortness of breath.  History obtained from wife.  At baseline patient is bedbound not on oxygen, requires assistance with all activities.  He vomited twice the day of admission.  Intermittent cough.  On EMS arrival patient oxygen sat was found to be 80% placed on nonrebreather.  Subsequently he was transitioned to 2 L of oxygen in the ED.  Patient was found to have AKI, chest x-ray with left lung base compatible with pneumonia.  Patient admitted with AKI, vomiting, pneumonia.  Patient with poor prognosis, palliative care has been consulted.  He has had more than 6 hospitalization in the last 6 months.  Family and patient discussed with palliative, Continue with IV fluids, IV antibiotics, Blood transfusion as needed,  No plan for endoscopy. Management  gastric out obstruction, vs ileus. If no significant improvement in next several days will need further palliaive care conversation.  Assessment & Plan:   Principal Problem:   Aspiration pneumonia ([place]) Active Problems:   Acute respiratory failure with hypoxia ([place])   History of CVA with residual deficit   Dysphagia   Acute kidney injury superimposed on chronic kidney disease ([name])   Anemia, chronic disease   Essential hypertension   Chronic indwelling Foley catheter   Chronic deep vein thrombosis (DVT) ([name])   Chronic anticoagulation   Seizure disorder ([name])   DNR (do not resuscitate)   Pressure injury of skin   1-Acute hypoxic respiratory failure possible secondary to aspiration pneumonia: Presented with hypoxia, oxygen saturation down to 80%, chest x-ray with left-sided pneumonia, he had 2  episode of vomiting, worsening shortness of breath. -On IV Unasyn, day 6/7 antibiotics.  -Blood cultures no growth to date. -RSV, COVID-19, influenza: Negative -Continue with flutter valve -Procalcitonin 0.3 -Palliative care consulted and following.  Patient wishes to go home with hospice care.   -Plan to continue with IV antibiotics, fluids for Now.    2-Chronic dysphagia, history of CVA with residual deficit Acute on chronic encephalopathy Aspiration precaution NPO, due to vomiting.  If Family wishes to proceed with  hospice care approach could consider pured diet, when vomiting resolved.   3-AKI on CKD stage IV: Patient creatinine baseline 2.4--2.5 Cr  peaked to 3.6 on admission. Suspect hypovolemia in the setting of vomiting and poor oral intake He is not good candidate for HD, he is bed bound, has recurrent aspiration.  Cr down to 2.3 close to baseline.  Continue with IV fluids.   Hypernatremia; Improved with IV fluids. Sodium down to 146---from 151 Continue with IV fluids.   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  Hb 8.5---9.5--6.8--7.2---8.6 Related to Upper GI loss, bleed.  Received total 2 unit PRBC 12/15, 12/16.  Hb stable. NG tube out no black anymore.  Monitor hb.   5-Vomiting/ black content: Gastric Outlet obstruction vs gastroparesis vs severe  Constipation.  -NPO -Continue with IV fluids. -IV protonix  gtt.  -Hold Eliquis.  -KUB. Gastric distension concern for gastric out let obstruct vs Ileus.  -Plan to continue with fluids, NG tube to suction and blood transfusion as needed.  -Almost 2 L fluid obtain  from NGT on insertion.  -Repeated KUB: Unable to evaluate distention of stomach, Large stool ball in the recutm. -CT abdomen pelvis: Stomach decompress, Large amount of stool in the rectum has slightly decreased in amount when compared to the  prior study. There is new rectosigmoid wall thickening with marked inflammatory stranding surrounding the rectum worrisome for stercoral colitis. Already on Unasyn.  -He has had Multiples BM.  -NG tube out put yesterday 600 cc.  -plan to start Reglan and repeat KUB 12/20. If no significant improvement next several days will needs palliative care follow up.   Essential hypertension: Hold  metoprolol hydralazine amlodipine  Chronic indwelling Foley catheter secondary to BPH: Continue with finasteride and tamsulosin Urine culture growing; Enterococcus  faecalis change to Unasyn.   Chronic DVT on chronic anticoagulation: Diagnosed 2023. Continue with Eliquis  History of seizure disorder: Continue with Keppra Hypomagnesemia; replete IV See wound care documentation below  Pressure Injury 05/17/22 Buttocks Right Stage 2 -  Partial thickness loss of dermis presenting as a shallow open injury with a red, pink wound bed without slough. (Active)  05/17/22 2300  Location: Buttocks  Location Orientation: Right  Staging: Stage 2 -  Partial thickness loss of dermis presenting as a shallow open injury with a red, pink wound bed without slough.  Wound Description (Comments):   Present on Admission: Yes     Pressure Injury 09/07/22 Buttocks Right Stage 2 -  Partial thickness loss of dermis presenting as a shallow open injury with a red, pink wound bed without slough. (Active)  09/07/22 1129  Location: Buttocks  Location Orientation: Right  Staging: Stage 2 -  Partial thickness loss of dermis presenting as a shallow open injury with a red, pink wound bed without slough.  Wound Description (Comments):   Present on Admission: Yes  Dressing Type None 09/11/22 2049    Estimated body mass index is 21.26 kg/m as calculated from the following:   Height as of 08/01/22: 6' 3.5" (1.918 m).   Weight as of 08/12/22: 78.2 kg.   DVT prophylaxis:scd Code Status: DNR Family Communication: Wife  at  bedside Disposition Plan:  Status is: Inpatient Remains inpatient appropriate because: management PNA, vomiting , renal failure.     Consultants:  Palliative  Procedures:    Antimicrobials:    Subjective: He is sleepy, denies pain. He has had 3 BM since last night.  NG tube out put since yesterday 600.   Objective: Vitals:   09/11/22 1622 09/11/22 2131 09/12/22 0601 09/12/22 1036  BP: (!) 163/75 (!) 161/79 (!) 162/77 (!) 168/74  Pulse: 60 61 (!) 59 60  Resp: '17 16 17 17  '$ Temp: 98.9 F (37.2 C) 98.7 F (37.1 C) 98.1 F (36.7 C)   TempSrc: Oral Oral Oral Oral  SpO2: 98% 100% 100% 100%    Intake/Output Summary (Last 24 hours) at 09/12/2022 1515 Last data filed at 09/12/2022 1340 Gross per 24 hour  Intake 3008.96 ml  Output 1850 ml  Net 1158.96 ml    There were no vitals filed for this visit.  Examination:  General exam: Chronic Ill appearing. NG tube in place.  Respiratory system: CTA Cardiovascular system: S 1, S 2 RRR Gastrointestinal system: BS present, soft, nt Central nervous system: Alert, follows command Extremities: trace edema    Data Reviewed: I have personally reviewed following labs and imaging studies  CBC: Recent Labs  Lab 09/06/22 0743 09/07/22 0256 09/08/22 0707 09/08/22 1903 09/09/22 0225 09/09/22 0230 09/09/22 2134 09/10/22 0209  09/11/22 0343 09/12/22 0652  WBC 6.5   < > 7.6  --  7.1  --   --  7.8 8.4 8.1  NEUTROABS 4.3  --   --   --   --   --   --   --   --   --   HGB 9.5*   < > 6.8*   < > 7.2* 7.2* 8.6* 8.6* 8.0* 7.9*  HCT 29.5*   < > 21.2*   < > 22.1* 22.0* 27.3* 26.4* 25.3* 24.4*  MCV 89.9   < > 93.0  --  92.9  --   --  92.6 94.1 92.1  PLT 195   < > 149*  --  153  --   --  163 141* 139*   < > = values in this interval not displayed.    Basic Metabolic Panel: Recent Labs  Lab 09/08/22 0707 09/09/22 0225 09/10/22 0209 09/11/22 0343 09/12/22 0652  NA 146* 148* 151* 147* 146*  K 4.7 4.4 3.8 3.6 3.3*  CL 115* 116*  121* 118* 114*  CO2 '24 25 25 24 25  '$ GLUCOSE 153* 158* 161* 137* 119*  BUN 108* 88* 65* 49* 38*  CREATININE 3.80* 3.15* 2.61* 2.38* 2.22*  CALCIUM 8.9 9.0 9.2 9.1 9.1  MG  --   --   --   --  1.4*    GFR: CrCl cannot be calculated (Unknown ideal weight.). Liver Function Tests: Recent Labs  Lab 09/06/22 0743  AST 14*  ALT 15  ALKPHOS 71  BILITOT 0.3  PROT 6.2*  ALBUMIN 3.3*    Recent Labs  Lab 09/06/22 0743  LIPASE 40    No results for input(s): "AMMONIA" in the last 168 hours. Coagulation Profile: No results for input(s): "INR", "PROTIME" in the last 168 hours. Cardiac Enzymes: No results for input(s): "CKTOTAL", "CKMB", "CKMBINDEX", "TROPONINI" in the last 168 hours. BNP (last 3 results) No results for input(s): "PROBNP" in the last 8760 hours. HbA1C: No results for input(s): "HGBA1C" in the last 72 hours. CBG: No results for input(s): "GLUCAP" in the last 168 hours. Lipid Profile: No results for input(s): "CHOL", "HDL", "LDLCALC", "TRIG", "CHOLHDL", "LDLDIRECT" in the last 72 hours. Thyroid Function Tests: No results for input(s): "TSH", "T4TOTAL", "FREET4", "T3FREE", "THYROIDAB" in the last 72 hours. Anemia Panel: No results for input(s): "VITAMINB12", "FOLATE", "FERRITIN", "TIBC", "IRON", "RETICCTPCT" in the last 72 hours. Sepsis Labs: Recent Labs  Lab 09/06/22 0743  PROCALCITON 0.31  LATICACIDVEN 0.9     Recent Results (from the past 240 hour(s))  Urine Culture     Status: Abnormal   Collection Time: 09/06/22  7:49 AM   Specimen: In/Out Cath Urine  Result Value Ref Range Status   Specimen Description IN/OUT CATH URINE  Final   Special Requests   Final    NONE Performed at [facility], [place] [address], [place], [place] [address]    Culture >=100,000 COLONIES/mL ENTEROCOCCUS FAECALIS (A)  Final   Report Status 09/08/2022 FINAL  Final   Organism ID, Bacteria ENTEROCOCCUS FAECALIS (A)  Final      Susceptibility   Enterococcus faecalis - MIC*     AMPICILLIN <=2 SENSITIVE Sensitive     NITROFURANTOIN <=16 SENSITIVE Sensitive     VANCOMYCIN 1 SENSITIVE Sensitive     * >=100,000 COLONIES/mL ENTEROCOCCUS FAECALIS  Resp panel by RT-PCR (RSV, Flu A&B, Covid) Anterior Nasal Swab     Status: None   Collection Time: 09/06/22  7:51  AM   Specimen: Anterior Nasal Swab  Result Value Ref Range Status   SARS Coronavirus 2 by RT PCR NEGATIVE NEGATIVE Final    Comment: (NOTE) SARS-CoV-2 target nucleic acids are NOT DETECTED.  The SARS-CoV-2 RNA is generally detectable in upper respiratory specimens during the acute phase of infection. The lowest concentration of SARS-CoV-2 viral copies this assay can detect is 138 copies/mL. A negative result does not preclude SARS-Cov-2 infection and should not be used as the sole basis for treatment or other patient management decisions. A negative result may occur with  improper specimen collection/handling, submission of specimen other than nasopharyngeal swab, presence of viral mutation(s) within the areas targeted by this assay, and inadequate number of viral copies(<138 copies/mL). A negative result must be combined with clinical observations, patient history, and epidemiological information. The expected result is Negative.  Fact Sheet for Patients:  EntrepreneurPulse.com.au  Fact Sheet for Healthcare Providers:  IncredibleEmployment.be  This test is no t yet approved or cleared by the [place] FDA and  has been authorized for detection and/or diagnosis of SARS-CoV-2 by FDA under an Emergency Use Authorization (EUA). This EUA will remain  in effect (meaning this test can be used) for the duration of the COVID-19 declaration under Section 564(b)(1) of the Act, 21 U.S.C.section 360bbb-3(b)(1), unless the authorization is terminated  or revoked sooner.       Influenza A by PCR NEGATIVE NEGATIVE Final   Influenza B by PCR NEGATIVE NEGATIVE Final     Comment: (NOTE) The Xpert Xpress SARS-CoV-2/FLU/RSV plus assay is intended as an aid in the diagnosis of influenza from Nasopharyngeal swab specimens and should not be used as a sole basis for treatment. Nasal washings and aspirates are unacceptable for Xpert Xpress SARS-CoV-2/FLU/RSV testing.  Fact Sheet for Patients: EntrepreneurPulse.com.au  Fact Sheet for Healthcare Providers: IncredibleEmployment.be  This test is not yet approved or cleared by the [place] FDA and has been authorized for detection and/or diagnosis of SARS-CoV-2 by FDA under an Emergency Use Authorization (EUA). This EUA will remain in effect (meaning this test can be used) for the duration of the COVID-19 declaration under Section 564(b)(1) of the Act, 21 U.S.C. section 360bbb-3(b)(1), unless the authorization is terminated or revoked.     Resp Syncytial Virus by PCR NEGATIVE NEGATIVE Final    Comment: (NOTE) Fact Sheet for Patients: EntrepreneurPulse.com.au  Fact Sheet for Healthcare Providers: IncredibleEmployment.be  This test is not yet approved or cleared by the [place] FDA and has been authorized for detection and/or diagnosis of SARS-CoV-2 by FDA under an Emergency Use Authorization (EUA). This EUA will remain in effect (meaning this test can be used) for the duration of the COVID-19 declaration under Section 564(b)(1) of the Act, 21 U.S.C. section 360bbb-3(b)(1), unless the authorization is terminated or revoked.  Performed at [facility], [place] [address] [place], [place] [address]   Culture, blood (single) w Reflex to ID Panel     Status: None   Collection Time: 09/06/22  5:03 PM   Specimen: BLOOD  Result Value Ref Range Status   Specimen Description BLOOD RIGHT ANTECUBITAL  Final   Special Requests   Final    BOTTLES DRAWN AEROBIC AND ANAEROBIC Blood Culture adequate volume   Culture   Final    NO  GROWTH 5 DAYS Performed at [facility], [place] [address], [place],  [address]    Report Status 09/11/2022 FINAL  Final  Radiology Studies: CT ABDOMEN PELVIS WO CONTRAST  Result Date: 09/11/2022 CLINICAL DATA:  Bowel obstruction suspected. EXAM: CT ABDOMEN AND PELVIS WITHOUT CONTRAST TECHNIQUE: Multidetector CT imaging of the abdomen and pelvis was performed following the standard protocol without IV contrast. RADIATION DOSE REDUCTION: This exam was performed according to the departmental dose-optimization program which includes automated exposure control, adjustment of the mA and/or kV according to patient size and/or use of iterative reconstruction technique. COMPARISON:  CT abdomen and pelvis 09/07/2022 FINDINGS: Lower chest: There are small bilateral pleural effusions. There is airspace disease in the left lung base. There has been no significant interval change. Heart is enlarged. Hepatobiliary: Gallstones are present. No focal liver lesions are identified. No obvious biliary ductal dilatation. Pancreas: Unremarkable. No pancreatic ductal dilatation or surrounding inflammatory changes. Spleen: Normal in size without focal abnormality. Adrenals/Urinary Tract: The bladder is decompressed by Foley catheter. There is no hydronephrosis. Bilateral renal cysts, some of which are complex with calcifications, appear unchanged from the prior study. The adrenal glands are within normal limits. Stomach/Bowel: There is a large amount of stool in the rectum which has slightly decreased in amount when compared to the prior study. There is new rectosigmoid wall thickening with marked inflammatory stranding surrounding the rectum. There is sigmoid colon diverticulosis. There is no evidence for bowel obstruction, pneumatosis or free air. Appendix is not seen. Nasogastric tube tip is in the mid stomach. The stomach is now decompressed. Vascular/Lymphatic: Aortic atherosclerosis. No enlarged  abdominal or pelvic lymph nodes. Reproductive: Prostate is unremarkable. Other: There is no ascites or focal abdominal wall hernia. There is mild body wall edema, unchanged. Musculoskeletal: Degenerative changes affect the lumbar spine IMPRESSION: 1. Large amount of stool in the rectum has slightly decreased in amount when compared to the prior study. There is new rectosigmoid wall thickening with marked inflammatory stranding surrounding the rectum worrisome for stercoral colitis. 2. No evidence for bowel obstruction. 3. Cholelithiasis. 4. Stable small bilateral pleural effusions with left lower lobe airspace disease. 5. Stable mild body wall edema. 6. Nasogastric tube tip is in the mid stomach. The stomach is now decompressed. Aortic Atherosclerosis (ICD10-I70.0). Electronically Signed   By: Ronney Asters M.D.   On: 09/11/2022 01:37        Scheduled Meds:  atorvastatin  40 mg Oral Daily   brimonidine  1 drop Both Eyes Daily   Chlorhexidine Gluconate Cloth  6 each Topical Daily   dorzolamide-timolol  1 drop Both Eyes Daily   finasteride  5 mg Oral Daily   latanoprost  1 drop Both Eyes QHS   metoCLOPramide (REGLAN) injection  5 mg Intravenous Q8H   metoprolol succinate  50 mg Oral QHS   pantoprazole  40 mg Intravenous Q12H   tamsulosin  0.4 mg Oral Daily   Continuous Infusions:  ampicillin-sulbactam (UNASYN) IV     dextrose 100 mL/hr at 09/12/22 0030   levETIRAcetam 500 mg (09/12/22 0946)     LOS: 6 days    Time spent: 35 minutes    Sheralyn Pinegar A Eldrige Pitkin, MD [facility]   If 7PM-7AM, please contact night-coverage [email or web address]  09/12/2022, 3:15 PM

## 2022-09-12 NOTE — Progress Notes (Signed)
Palliative:  HPI:  71 y.o. male with medical history significant of hypertension, hyperlipidemia, CVA with residual left-sided weakness, ALS, chronic dysphagia, CKD stage IV, DVT on Eliquis, BPH on chronic Foley catheter who presents with complaints of shortness of breath. He has suffered multiple aspiration PNA's and has had (+)6 IP admissions in the past 6 months. He is followed OP by [facility]. Palliative consultation in October of this year at which time he and his wife were not ready to accept hospice care.     I met today again with Darrell [name] and wife, Darrell [name], at bedside. He is having some level of improvement and Darrell [name] is happy about this improvement. I spent time clarifying goals with them and they continue to endorse plan to go home with hospice. We discussed plan to d/c NGT when attending feels ready and then we will proceed with continuing previous pureed and thickened liquid diet. They do wish to proceed with home hospice support. They do NOT wish to return to the hospital with further decline. I spoke with Darrell [name] about having medications that she can administer sublingually in the home for any symptoms: pain, shortness of breath, anxiety, etc. I educated that hospice will help walk her through using these medications when needed. I reassured her that hospice will be a good support for her and to lean on them for guidance on how to best care for [facility]. They both express understanding. Darrell [name] is expecting a call from hospice to further arrange time to receive equipment in the home (I did explain that this will need to be in the home prior to discharge). I also discussed plans with hospice liaison Roxanne Teague.   All questions/concerns addressed. Emotional support.   Exam: Alert, oriented. Contributes little to conversation but he does answer when prompted and he is listening. No distress. Generalized weakness consistent with ALS. No distress.   Plan: - DNR - Home with  hospice (no return to hospice desired) - Please send home with PRN sublingual prescriptions to ensure comfort (i.e. roxanol 2.5 mg q6h PRN pain)   50 min  Vinie Sill, NP Palliative Medicine Team Pager [phone] (Please see [email or web address] for schedule) Team Phone [phone]    Greater than 50%  of this time was spent counseling and coordinating care related to the above assessment and plan

## 2022-09-12 NOTE — Progress Notes (Signed)
SLP Cancellation Note  Patient Details Name: Darrell [name] MRN: [id number] DOB: August 23, 1951   Cancelled treatment:       Reason Eval/Treat Not Completed: Medical issues which prohibited therapy. Per RN, pt is still on NGT to suction. MD wants to keep the tube in for at least one more day. Will continue efforts.   Yunique Dearcos B. [name], [facility], [place] Speech Language Pathologist Office: [phone]  [place], [place] 09/12/2022, 12:14 PM

## 2022-09-12 NOTE — Consult Note (Signed)
[facility] Nurse Consult Note: Reason for Consult: Consult received for suggestions for care of integumentary system affected by scrotal edema Wound type: N/A Pressure Injury POA: N/A Measurement:N/A Wound bed:N/A Drainage (amount, consistency, odor) N/A Periwound: edematous, dry Dressing procedure/placement/frequency: I have provided Nursing with guidance for the care of the scrotal skin using a daily cleanse with house skin cleanser (pH balanced, no rinse) followed by application of house moisture barrier cream. House antimicrobial moisture wicking textile Phil Dopp, Kellie Simmering # [phone]) is to be used beneath scrotum as a sling with the ends draped over thighs. A rolled towel is to be placed beneath the scrotum and removed and replaced every 4 hours to prevent pressure injury. The [facility] is to be lowered to a 30-degree angle and the knee gatch is not to be used while in bed to prevent fluid accumulation at the fulcrum (scrotum).  [place] nursing team will not follow, but will remain available to this patient, the nursing and medical teams.  Please re-consult if needed.  Thank you for inviting [place] to participate in this patient's Plan of Care.  Maudie Flakes, MSN, RN, CNS, [place], Serita Grammes, [employer], [employer] phone:  [phone]

## 2022-09-13 ENCOUNTER — Inpatient Hospital Stay (HOSPITAL_COMMUNITY): Payer: Medicare HMO

## 2022-09-13 DIAGNOSIS — J690 Pneumonitis due to inhalation of food and vomit: Secondary | ICD-10-CM | POA: Diagnosis not present

## 2022-09-13 LAB — CBC
HCT: 24.2 % — ABNORMAL LOW (ref 39.0–52.0)
Hemoglobin: 8.1 g/dL — ABNORMAL LOW (ref 13.0–17.0)
MCH: 30.7 pg (ref 26.0–34.0)
MCHC: 33.5 g/dL (ref 30.0–36.0)
MCV: 91.7 fL (ref 80.0–100.0)
Platelets: 136 10*3/uL — ABNORMAL LOW (ref 150–400)
RBC: 2.64 MIL/uL — ABNORMAL LOW (ref 4.22–5.81)
RDW: 14.9 % (ref 11.5–15.5)
WBC: 9.3 10*3/uL (ref 4.0–10.5)
nRBC: 0 % (ref 0.0–0.2)

## 2022-09-13 LAB — BASIC METABOLIC PANEL
Anion gap: 4 — ABNORMAL LOW (ref 5–15)
BUN: 33 mg/dL — ABNORMAL HIGH (ref 8–23)
CO2: 25 mmol/L (ref 22–32)
Calcium: 8.8 mg/dL — ABNORMAL LOW (ref 8.9–10.3)
Chloride: 114 mmol/L — ABNORMAL HIGH (ref 98–111)
Creatinine, Ser: 2.22 mg/dL — ABNORMAL HIGH (ref 0.61–1.24)
GFR, Estimated: 31 mL/min — ABNORMAL LOW (ref >60)
Glucose, Bld: 87 mg/dL (ref 70–99)
Potassium: 3.4 mmol/L — ABNORMAL LOW (ref 3.5–5.1)
Sodium: 143 mmol/L (ref 135–145)

## 2022-09-13 LAB — MAGNESIUM: Magnesium: 1.8 mg/dL (ref 1.7–2.4)

## 2022-09-13 MED ORDER — PANTOPRAZOLE SODIUM 40 MG PO TBEC
40.0000 mg | DELAYED_RELEASE_TABLET | Freq: Two times a day (BID) | ORAL | Status: DC
  Administered 2022-09-13 – 2022-09-16 (×6): 40 mg via ORAL
  Filled 2022-09-13 (×6): qty 1

## 2022-09-13 NOTE — Progress Notes (Signed)
Darrell [name]  EHM:[id number] DOB: 1951/08/09 DOA: 09/06/2022 PCP: [name], MD    Brief Narrative:  71 year old with a history of HTN, HLD, CVA with residual left-sided weakness, ALS, chronic dysphagia, and CKD stage IV, DVT on chronic Eliquis, and BPH with chronic Foley catheter who presented to the ER with shortness of breath.  At baseline the patient is bedbound but does not require oxygen.  On the date of his admission he was noted to have vomited x 2 and had subsequent intermittent cough.  On arrival EMS found the patient to be saturating at 80% on room air.  Chest x-ray was suggestive of left lower lobe infiltrate on presentation to the ER.  Of note this represented his sixth hospitalization within the last 6 months.  Consultants:  None  Goals of Care:  Code Status: DNR   DVT prophylaxis: SCDs  Interim Hx: Afebrile.  Vital signs stable.  Oxygen saturation 94% room air.  Hemoglobin is stable.  Magnesium has improved.  States he is ready to have his NG removed.  No new complaints today.  Assessment & Plan:  Acute hypoxic respiratory failure Due to pneumonitis -as evidenced by oxygen saturation of 80% with dyspnea at presentation -now resolved  Aspiration pneumonia/pneumonitis Has completed 7 days of empiric antibiotic therapy -RSV COVID and influenza negative  Chronic dysphagia as persisting effect of prior CVA Resume modified diet with discontinuation of NG tube today  Acute kidney injury on CKD stage IV Baseline creatinine approximately 2.4 -creatinine peaked at 3.6 during this admission -has returned to baseline with volume resuscitation  Hypernatremia Due to free water deficit -has improved with volume resuscitation  Acute blood loss anemia on anemia of chronic kidney disease - Suspected low-grade upper GI bleed Patient had an episode of coffee-ground emesis during his hospital stay -Eliquis discontinued -continue PPI  Gastric outlet obstruction versus  gastroparesis versus severe constipation KUB suggested gastric distention suggestive of gastric outlet obstruction versus ileus -subsequent imaging also revealed a large amount of stool in the rectum -follow-up CT abdomen and pelvis noted eventual decompression of the stomach but confirmed persisting a large amount of stool in the rectum -with bowel regimen multiple bowel movements were accomplished -Reglan initiated -DC NG tube today  HTN Strict control not indicated in current clinical setting -goal will be to avoid extremes  Chronic indwelling Foley catheter secondary to BPH Continue finasteride and tamsulosin  Enterococcus faecalis in urine culture Being treated with Unasyn  Chronic DVT on anticoagulation Continue usual Eliquis dose  Seizure disorder Continue Keppra  Hypomagnesemia Supplemented  Pressure injury POA Pressure Injury 09/07/22 Buttocks Right Stage 2 -  Partial thickness loss of dermis presenting as a shallow open injury with a red, pink wound bed without slough. (Active)  09/07/22 1129  Location: Buttocks  Location Orientation: Right  Staging: Stage 2 -  Partial thickness loss of dermis presenting as a shallow open injury with a red, pink wound bed without slough.  Wound Description (Comments):   Present on Admission: Yes    Goals of care Palliative care has met with the patient and his family during this hospital stay -DNR/no CODE BLUE status has been confirmed -the plan is for the patient to initiate hospice care at home upon discharge   Family Communication: Spoke with wife at bedside   Objective: Blood pressure (!) 146/68, pulse 64, temperature 98.4 F (36.9 C), temperature source Oral, resp. rate 16, SpO2 94 %.  Intake/Output Summary (Last 24 hours) at 09/13/2022 1129  Last data filed at 09/13/2022 0700 Gross per 24 hour  Intake 1124.03 ml  Output 2150 ml  Net -1025.97 ml   There were no vitals filed for this visit.  Examination: General: No  acute respiratory distress Lungs: Clear to auscultation bilaterally without wheezes or crackles Cardiovascular: Regular rate and rhythm without murmur gallop or rub normal S1 and S2 Abdomen: Nontender, nondistended, soft, bowel sounds positive, no rebound, no ascites, no appreciable mass Extremities: No significant cyanosis, clubbing, or edema bilateral lower extremities  CBC: Recent Labs  Lab 09/11/22 0343 09/12/22 0652 09/13/22 0218  WBC 8.4 8.1 9.3  HGB 8.0* 7.9* 8.1*  HCT 25.3* 24.4* 24.2*  MCV 94.1 92.1 91.7  PLT 141* 139* 449*   Basic Metabolic Panel: Recent Labs  Lab 09/11/22 0343 09/12/22 0652 09/13/22 0218  NA 147* 146* 143  K 3.6 3.3* 3.4*  CL 118* 114* 114*  CO2 _0 GLUCOSE 137* 119* 87  BUN 49* 38* 33*  CREATININE 2.38* 2.22* 2.22*  CALCIUM 9.1 9.1 8.8*  MG  --  1.4* 1.8   GFR: CrCl cannot be calculated (Unknown ideal weight.).   Scheduled Meds:  brimonidine  1 drop Both Eyes Daily   Chlorhexidine Gluconate Cloth  6 each Topical Daily   dorzolamide-timolol  1 drop Both Eyes Daily   finasteride  5 mg Oral Daily   latanoprost  1 drop Both Eyes QHS   metoCLOPramide (REGLAN) injection  5 mg Intravenous Q8H   metoprolol succinate  50 mg Oral QHS   pantoprazole  40 mg Intravenous Q12H   tamsulosin  0.4 mg Oral Daily   Continuous Infusions:  ampicillin-sulbactam (UNASYN) IV 3 g (09/13/22 1059)   dextrose 100 mL/hr at 09/13/22 0257   levETIRAcetam 500 mg (09/13/22 0959)     LOS: 7 days   Cherene Altes, MD [facility] Office  [phone] Pager - Text Page per Shea Evans  If 7PM-7AM, please contact night-coverage per Amion 09/13/2022, 11:29 AM

## 2022-09-13 NOTE — Plan of Care (Signed)

## 2022-09-14 ENCOUNTER — Ambulatory Visit: Payer: Self-pay

## 2022-09-14 DIAGNOSIS — J690 Pneumonitis due to inhalation of food and vomit: Secondary | ICD-10-CM | POA: Diagnosis not present

## 2022-09-14 DIAGNOSIS — J189 Pneumonia, unspecified organism: Secondary | ICD-10-CM

## 2022-09-14 MED ORDER — METOCLOPRAMIDE HCL 5 MG PO TABS
5.0000 mg | ORAL_TABLET | Freq: Three times a day (TID) | ORAL | Status: DC
  Administered 2022-09-15 – 2022-09-16 (×4): 5 mg via ORAL
  Filled 2022-09-14 (×5): qty 1

## 2022-09-14 MED ORDER — GUAIFENESIN 100 MG/5ML PO LIQD
5.0000 mL | ORAL | Status: DC | PRN
  Administered 2022-09-14 – 2022-09-15 (×2): 5 mL via ORAL
  Filled 2022-09-14 (×2): qty 5

## 2022-09-14 MED ORDER — LEVETIRACETAM 500 MG PO TABS
500.0000 mg | ORAL_TABLET | Freq: Two times a day (BID) | ORAL | Status: DC
  Administered 2022-09-15 – 2022-09-16 (×3): 500 mg via ORAL
  Filled 2022-09-14 (×3): qty 1

## 2022-09-14 NOTE — Progress Notes (Signed)
Palliative:  Chart reviewed. Discussed with hospice liaison [facility] as well as Dr. Thereasa Solo. Have asked RN and CMRN to help connect wife at bedside with hospice so they can have oxygen delivered to be ready in the home when he discharges. Hopefully he can get home tomorrow with hospice support. Goals were clarified on 12/19 - please see my note on that day.   No charge  Vinie Sill, NP Palliative Medicine Team Pager [phone] (Please see [email or web address] for schedule) Team Phone [phone]

## 2022-09-14 NOTE — Care Management Important Message (Signed)
Important Message  Patient Details  Name: Darrell [name] MRN: [id number] Date of Birth: Jan 06, 1951   Medicare Important Message Given:  Yes     Hannah Beat 09/14/2022, 11:27 AM

## 2022-09-14 NOTE — Progress Notes (Signed)
Speech Language Pathology Treatment: Dysphagia  Patient Details Name: Darrell [name] MRN: [id number] DOB: May 08, 1951 Today's Date: 09/14/2022 Time: 1025-8527 SLP Time Calculation (min) (ACUTE ONLY): 21 min  Assessment / Plan / Recommendation Clinical Impression  Pt seen for dysphagia f/u session to re-assess swallow function and determine safest diet/educate pt/caregiver re: swallowing/aspiration precautions since NG tube removed this date. Wife present for re-assessment.  Chart review revealed MBS completed 03/24/22 with recommendation for Dysphagia 1(puree) with honey-thickened liquids d/t decreased sensation resulting in thin/nectar aspirated prior to swallow initiation.  Wife informed SLP during assessment that pt does have thin liquids with small amounts at home and thicker liquids have been discontinued d/t pt's decreased satiety.  Discussed use of gel vs powder to thicken liquids and precautions to use during implementation of current diet.  Pt provided oral care prior to PO intake with delayed onset of swallow with puree/thin in varying amounts with an audible swallow noted, but no cough/throat clearing observed.  Pt silently aspirated materials on prior MBS, so suspect this may be occurring again with deconditioning and progression of ALS.  WIfe requesting repeat MBS if able to determine if pt able to consume thin/nectar with compensatory strategies (ie: Provale cup) and if current diet remains safe as pt has had two episodes of PNA in the last 6 months.  Discussed with wife/pt likelihood of aspiration PNA given prior hx, but provided extensive education re: decreasing reoccurrence of this in future.  ST will f/u for objective assessment to determine current swallow function and dyphagia tx while in acute setting.  HPI HPI: 71 y.o. male with medical history significant of hypertension, hyperlipidemia, CVA with residual left-sided weakness, ALS, chronic dysphagia, CKD stage IV, DVT on Eliquis, BPH on  chronic Foley catheter who presented with complaints of shortness of breath on 09/06/22. History was obtained from from the patient's wife at bedside who is his primary caregiver.  At baseline patient is not on oxygen, bedbound, and requires assistance with all of activities of daily life.  Regurgitation x2 noted prior to hospitalization.  No reported fevers or chills.  He has had intermittent cough that is unchanged from baseline.  Patient has a chronic indwelling Foley for which wife notes was recently changed.  Wife reports chronic dysphagia with MBS completed 03/24/22 recommending Dysphagia 1/honey-thickened liquids.  ST f/u to assess current swallow function.      SLP Plan  MBS;New goals to be determined pending instrumental study      Recommendations for follow up therapy are one component of a multi-disciplinary discharge planning process, led by the attending physician.  Recommendations may be updated based on patient status, additional functional criteria and insurance authorization.    Recommendations  Diet recommendations: Dysphagia 1 (puree);Honey-thick liquid Liquids provided via: Teaspoon;Cup;Straw Medication Administration: Crushed with puree Supervision: Trained caregiver to feed patient;Full supervision/cueing for compensatory strategies Compensations: Slow rate;Small sips/bites;Effortful swallow;Clear throat intermittently Postural Changes and/or Swallow Maneuvers: Seated upright 90 degrees                Oral Care Recommendations: Oral care BID;Staff/trained caregiver to provide oral care Follow Up Recommendations: Follow physician's recommendations for discharge plan and follow up therapies Assistance recommended at discharge: Frequent or constant Supervision/Assistance SLP Visit Diagnosis: Dysphagia, oropharyngeal phase (R13.12) Plan: MBS;New goals to be determined pending instrumental study           Elvina Sidle, M.S., CCC-SLP  09/14/2022, 4:16 PM

## 2022-09-14 NOTE — Patient Outreach (Signed)
  Care Coordination   Follow Up Visit Note   09/14/2022 Name: ZYKEE AVAKIAN MRN: [id number] DOB: 01-20-1951  ELSWORTH LEDIN is a 71 y.o. year old male who sees [name], MD for primary care. I reviewed patient's chart in preparation to contact patient for RN CC follow up.   What matters to the patients health and wellness today?  Patient is currently inpatient at [facility].     Goals Addressed             This Visit's Progress    I want to keep him healthy       Care Coordination Interventions: Reviewed chart in preparation to contact patient/caregiver Noted patient continues to be inpatient at [facility] with admit date: 09/06/22, dx: aspiration pneumonia         SDOH assessments and interventions completed:  No     Care Coordination Interventions:  Yes, provided   Follow up plan: Follow up call scheduled for 09/29/22 '@130'$  PM    Encounter Outcome:  Pt. Visit Completed

## 2022-09-14 NOTE — Consult Note (Signed)
[place]: Attempts have been made yesterday and today to reach wife to confirm wishes for homecare hospice at discharge and to schedule re-delivery of DME. She has not returned my call despite messages with direct number. We will need to speak to wife prior to day of discharge to get DME in place and plan for home enrollment. Sharalyn Ink, RN [facility] [phone]

## 2022-09-14 NOTE — Progress Notes (Signed)
Darrell [name]  OIZ:[id number] DOB: August 02, 1951 DOA: 09/06/2022 PCP: [name], MD    Brief Narrative:  71 year old with a history of HTN, HLD, CVA with residual left-sided weakness, ALS, chronic dysphagia, and CKD stage IV, DVT on chronic Eliquis, and BPH with chronic Foley catheter who presented to the ER with shortness of breath.  At baseline the patient is bedbound but does not require oxygen.  On the date of his admission he was noted to have vomited x 2 and had subsequent intermittent cough.  On arrival EMS found the patient to be saturating at 80% on room air.  Chest x-ray was suggestive of left lower lobe infiltrate on presentation to the ER.  Of note this represented his sixth hospitalization within the last 6 months.  Consultants:  None  Goals of Care:  Code Status: DNR   DVT prophylaxis: SCDs  Interim Hx: NG tube removed yesterday.  No acute events reported overnight.  Afebrile.  Vital signs stable.  Has been tolerating oral intake.  Doing well post NG removal.  Ongoing swelling of scrotum and penis noted, but no worse than previous.  No new complaints today.  Assessment & Plan:  Acute hypoxic respiratory failure Due to pneumonitis -as evidenced by oxygen saturation of 80% with dyspnea at presentation -now resolved -wean to room air  Aspiration pneumonia/pneumonitis Has completed 7 days of empiric antibiotic therapy -RSV COVID and influenza negative  Chronic dysphagia as persisting effect of prior CVA Resumed modified diet after discontinuation of NG tube 12/20 -tolerating diet well at this time  Acute kidney injury on CKD stage IV Baseline creatinine approximately 2.4 -creatinine peaked at 3.6 during this admission -has returned to baseline with volume resuscitation  Hypernatremia Due to free water deficit -has improved with volume resuscitation  Acute blood loss anemia on anemia of chronic kidney disease - Suspected low-grade upper GI bleed Patient had an episode  of coffee-ground emesis during his hospital stay -  Eliquis discontinued -continue PPI  Gastric outlet obstruction versus gastroparesis versus severe constipation KUB suggested gastric distention suggestive of gastric outlet obstruction versus ileus -subsequent imaging also revealed a large amount of stool in the rectum -follow-up CT abdomen and pelvis noted eventual decompression of the stomach but confirmed persisting a large amount of stool in the rectum -with bowel regimen multiple bowel movements were accomplished - Reglan initiated - NG tube successfully discontinued 12/20  HTN Strict control not indicated in current clinical setting -goal will be to avoid extremes  Chronic indwelling Foley catheter secondary to BPH Continue finasteride and tamsulosin  Enterococcus faecalis in urine culture Has completed a course of antibiotic therapy  Chronic DVT on anticoagulation Eliquis had to be discontinued in setting of GI bleeding - ongoing use of anticoag at this time likely to cause more harm than benefit   Seizure disorder Continue Keppra  Hypomagnesemia Supplemented  Pressure injury POA Pressure Injury 09/07/22 Buttocks Right Stage 2 -  Partial thickness loss of dermis presenting as a shallow open injury with a red, pink wound bed without slough. (Active)  09/07/22 1129  Location: Buttocks  Location Orientation: Right  Staging: Stage 2 -  Partial thickness loss of dermis presenting as a shallow open injury with a red, pink wound bed without slough.  Wound Description (Comments):   Present on Admission: Yes    Goals of care Palliative care has met with the patient and his family during this hospital stay -DNR/no CODE BLUE status has been confirmed -the plan is  for the patient to initiate hospice care at home upon discharge   Family Communication: Spoke with wife at bedside   Objective: Blood pressure (!) 142/72, pulse 62, temperature 98.1 F (36.7 C), temperature source  Oral, resp. rate 16, SpO2 100 %.  Intake/Output Summary (Last 24 hours) at 09/14/2022 1040 Last data filed at 09/14/2022 0746 Gross per 24 hour  Intake 400 ml  Output 1000 ml  Net -600 ml    There were no vitals filed for this visit.  Examination: General: No acute respiratory distress Lungs: Clear to auscultation bilaterally  Cardiovascular: Regular rate and rhythm without murmur  Abdomen: Nontender, nondistended, soft, bowel sounds positive, no rebound Extremities: No significant cyanosis, clubbing, or edema bilateral lower extremities  CBC: Recent Labs  Lab 09/11/22 0343 09/12/22 0652 09/13/22 0218  WBC 8.4 8.1 9.3  HGB 8.0* 7.9* 8.1*  HCT 25.3* 24.4* 24.2*  MCV 94.1 92.1 91.7  PLT 141* 139* 136*    Basic Metabolic Panel: Recent Labs  Lab 09/11/22 0343 09/12/22 0652 09/13/22 0218  NA 147* 146* 143  K 3.6 3.3* 3.4*  CL 118* 114* 114*  CO2 _0 GLUCOSE 137* 119* 87  BUN 49* 38* 33*  CREATININE 2.38* 2.22* 2.22*  CALCIUM 9.1 9.1 8.8*  MG  --  1.4* 1.8    GFR: CrCl cannot be calculated (Unknown ideal weight.).   Scheduled Meds:  brimonidine  1 drop Both Eyes Daily   Chlorhexidine Gluconate Cloth  6 each Topical Daily   dorzolamide-timolol  1 drop Both Eyes Daily   finasteride  5 mg Oral Daily   latanoprost  1 drop Both Eyes QHS   metoCLOPramide (REGLAN) injection  5 mg Intravenous Q8H   metoprolol succinate  50 mg Oral QHS   pantoprazole  40 mg Oral BID   tamsulosin  0.4 mg Oral Daily   Continuous Infusions:  dextrose 50 mL/hr at 09/14/22 0220   levETIRAcetam 500 mg (09/14/22 1034)     LOS: 8 days   Cherene Altes, MD [facility] Office  [phone] Pager - Text Page per Shea Evans  If 7PM-7AM, please contact night-coverage per Amion 09/14/2022, 10:40 AM

## 2022-09-15 ENCOUNTER — Inpatient Hospital Stay (HOSPITAL_COMMUNITY): Payer: Medicare HMO

## 2022-09-15 DIAGNOSIS — Z515 Encounter for palliative care: Secondary | ICD-10-CM | POA: Diagnosis not present

## 2022-09-15 DIAGNOSIS — J690 Pneumonitis due to inhalation of food and vomit: Secondary | ICD-10-CM | POA: Diagnosis not present

## 2022-09-15 DIAGNOSIS — Z7189 Other specified counseling: Secondary | ICD-10-CM | POA: Diagnosis not present

## 2022-09-15 NOTE — Progress Notes (Signed)
Speech Language Pathology Treatment: Dysphagia  Patient Details Name: Darrell [name] MRN: [id number] DOB: Jun 05, 1951 Today's Date: 09/15/2022 Time: 9628-3662 SLP Time Calculation (min) (ACUTE ONLY): 25 min  Assessment / Plan / Recommendation Clinical Impression  SLP went back to room with pt and wife after MBS in order provide more education about results of study. Discussed findings that include ongoing aspiration of anything thinner than honey thick on MBS at least. Wife, [name], says that he is fine with the pureed foods, but has been really wanting thin liquids, so they have been doing small sips by straw or spoon at home. Note that plan is to go home with hospice services and plan is to not return to the hospital. We discussed that aspiration could be contributing to these recurrent PNAs. Keeping him strictly on purees and honey thick liquids may reduce the risk of aspiration the most (although risk of aspiration is never eliminated). However, if overall GOC are focused on comfort and QOL, then wife may not want to restrict him from the thin liquids he is requesting. She does not seem to be ready to fully transition to all thin liquids, and instead wants to continue with purees and honey thick liquids but offer him small sips of thin liquids in between meals for pleasure. Education was provided about ways to reduce but not eliminate risk with thin liquids, including monitoring mentation and fatigue, providing thorough oral care prior to offering thin liquids, and considering offering water specifically [name] says he does love water). Starter kit was provided with honey thick liquids as well as contact information for return home regarding how to access more of the gel based thickener, which she thinks he would prefer. All questions answered at this time in light of impending d/c.    HPI HPI: 71 y.o. male with medical history significant of hypertension, hyperlipidemia, CVA with residual left-sided  weakness, ALS, chronic dysphagia, CKD stage IV, DVT on Eliquis, BPH on chronic Foley catheter who presented with complaints of shortness of breath on 09/06/22. History was obtained from from the patient's wife at bedside who is his primary caregiver.  At baseline patient is not on oxygen, bedbound, and requires assistance with all of activities of daily life.  Regurgitation x2 noted prior to hospitalization.  No reported fevers or chills.  He has had intermittent cough that is unchanged from baseline.  Patient has a chronic indwelling Foley for which wife notes was recently changed.  Wife reports chronic dysphagia with MBS completed 03/24/22 recommending Dysphagia 1/honey-thickened liquids.  ST f/u to assess current swallow function.      SLP Plan  Continue with current plan of care      Recommendations for follow up therapy are one component of a multi-disciplinary discharge planning process, led by the attending physician.  Recommendations may be updated based on patient status, additional functional criteria and insurance authorization.    Recommendations  Diet recommendations: Dysphagia 1 (puree);Honey-thick liquid (wife would like to be able to offer small sips of thin liquids for comfort) Liquids provided via: Teaspoon;Straw Medication Administration: Crushed with puree Supervision: Trained caregiver to feed patient;Full supervision/cueing for compensatory strategies Compensations: Slow rate;Small sips/bites Postural Changes and/or Swallow Maneuvers: Seated upright 90 degrees;Upright 30-60 min after meal                Oral Care Recommendations: Oral care BID;Staff/trained caregiver to provide oral care Follow Up Recommendations: Other (comment) (pt planning to d/c home with hospice) Assistance recommended at discharge: Frequent  or constant Supervision/Assistance SLP Visit Diagnosis: Dysphagia, oropharyngeal phase (R13.12) Plan: Continue with current plan of care            Osie Bond., M.A. [place] Office [phone]  Secure chat preferred   09/15/2022, 10:57 AM

## 2022-09-15 NOTE — Progress Notes (Addendum)
   Palliative Medicine Inpatient Follow Up Note HPI: Darrell [name] is a 71 y.o. male with medical history significant of hypertension, hyperlipidemia, CVA with residual left-sided weakness, ALS, chronic dysphagia, CKD stage IV, DVT on Eliquis, BPH on chronic Foley catheter who presents with complaints of shortness of breath. He has suffered multiple aspiration PNA's and has had (+)6 IP admissions in the past 6 months. He is followed OP by [facility].    PMT saw Mr, [name] in October of this year at which time he and his wife were not ready to accept hospice care.   Today's Discussion 09/15/2022  *Please note that this is a verbal dictation therefore any spelling or grammatical errors are due to the "[place] [employer]" system interpretation.  Chart reviewed inclusive of vital signs, progress notes, laboratory results, and diagnostic images. Hgb/Hct stable. Na high. Cr improved.   I met with Darrell [name] and his spouse, Darrell [name] at bedside this early morning. We reviewed that his condition is stable and he now has gotten the NGT removed and is able to eat/drink well on dysphagia diet with thickening liquids. Darrell [name] is glad he has progressed as he has.   We discussed interventions to prevent additional bowel blockage in the future. Reviewed how to continue with recommended diet at home.  I did discuss hospice with Darrell [name] and Darrell [name]. We reviewed the values and goals associated with hospice care. Patient and his wife would like to proceed with going home through [facility].   Questions and concerns addressed/Palliative Support Provided.   Objective Assessment: Vital Signs Vitals:   09/15/22 0535 09/15/22 0723  BP: (!) 148/70 (!) 148/77  Pulse: (!) 59 63  Resp: 15 16  Temp: 98.6 F (37 C) 98 F (36.7 C)  SpO2: 95% 96%    Intake/Output Summary (Last 24 hours) at 09/15/2022 1153 Last data filed at 09/14/2022 1700 Gross per 24 hour  Intake --  Output 301 ml   Net -301 ml   Gen:  [place] AA M in NAD HEENT: dry mucous membranes CV: Regular rate and irregular rhythm  PULM:  On RA, breathing is even and nonlabored ABD: soft/nontender  EXT: No edema  Neuro: Alert and oriented x3 - slow to respond (+) hypophonia  SUMMARY OF RECOMMENDATIONS   -DNAR/DNI -Discussion regarding bowel Ppx and how to keep liquids thickened -Plan to discharge home with [name] -Ongoing palliative care support  Billing based on MDM: High ______________________________________________________________________________________ [place] Team Team Cell Phone: [phone] Please utilize secure chat with additional questions, if there is no response within 30 minutes please call the above phone number  Palliative Medicine Team providers are available by phone from 7am to 7pm daily and can be reached through the team cell phone.  Should this patient require assistance outside of these hours, please call the patient's attending physician.

## 2022-09-15 NOTE — TOC Progression Note (Signed)
Transition of Care (TOC) - Progression Note   Patient's wife [name] spoke to [name] with [facility] about Hospice vs [facility] home-based palliative care.   Currently, wife prefers to go home with palliative care through [facility] and home health PT services . He is active with [facility] and HHPT. Confirmed with Claiborne Billings with [facility] . She will need HHPT/RN orders and face to face    Wife has called  [facility] this AM to get back on their schedule to initiate their home-based PCP services.   Patient will need ambulance transport home at discharge.   He is on room air, has wheel chair , hospital bed suction, NEB machine and walker at home already    Patient Details  Name: Darrell [name] MRN: [id number] Date of Birth: [birth year]  Transition of Care [facility]) CM/SW Contact  Dejanira Pamintuan, Edson Snowball, RN Phone Number: 09/15/2022, 12:54 PM  Clinical Narrative:       Expected Discharge Plan: Home w Hospice Care Barriers to Discharge: Continued Medical Work up  Expected Discharge Plan and Services   Discharge Planning Services: CM Consult Post Acute Care Choice: Hospice                   DME Arranged:  ([facility])           [place] Agency:  ([facility])         Social Determinants of Health (SDOH) Interventions SDOH Screenings   Food Insecurity: No Food Insecurity (07/10/2022)  Housing: Low Risk  (07/07/2022)  Transportation Needs: No Transportation Needs (07/20/2022)  Utilities: Not At Risk (07/07/2022)  Depression (PHQ2-9): Low Risk  (01/24/2022)  Tobacco Use: High Risk (08/01/2022)    Readmission Risk Interventions    05/19/2022    3:24 PM  Readmission Risk Prevention Plan  Transportation Screening Complete  Medication Review ([place]) Complete  HRI or [place] Complete  [place] Not Applicable

## 2022-09-15 NOTE — Progress Notes (Signed)
Darrell [name]  KKX:[id number] DOB: 10-Sep-1951 DOA: 09/06/2022 PCP: [name], MD    Brief Narrative:  71 year old with a history of HTN, HLD, CVA with residual left-sided weakness, ALS, chronic dysphagia, and CKD stage IV, DVT on chronic Eliquis, and BPH with chronic Foley catheter who presented to the ER with shortness of breath.  At baseline the patient is bedbound but does not require oxygen.  On the date of his admission he was noted to have vomited x 2 and had subsequent intermittent cough.  On arrival EMS found the patient to be saturating at 80% on room air.  Chest x-ray was suggestive of left lower lobe infiltrate on presentation to the ER.  Of note this represented his sixth hospitalization within the last 6 months.  Goals of Care:  Code Status: DNR   DVT prophylaxis: SCDs  Interim Hx: Afebrile with stable vitals.  Repeat SLP eval as reconfirmed that pured diet with honey thick liquids is safest consistency for his consumption. Sedate. Appears comfortable.   Assessment & Plan:  Acute hypoxic respiratory failure Due to pneumonitis -as evidenced by oxygen saturation of 80% with dyspnea at presentation -now resolved -weaned to room air  Aspiration pneumonia/pneumonitis Has completed 7 days of empiric antibiotic therapy -RSV COVID and influenza negative  Chronic dysphagia as persisting effect of prior CVA Resumed modified diet after discontinuation of NG tube 12/20 -tolerating diet well at this time  Acute kidney injury on CKD stage IV Baseline creatinine approximately 2.4 -creatinine peaked at 3.6 during this admission -has returned to baseline with volume resuscitation  Recent Labs  Lab 09/09/22 0225 09/10/22 0209 09/11/22 0343 09/12/22 0652 09/13/22 0218  CREATININE 3.15* 2.61* 2.38* 2.22* 2.22*    Hypernatremia Due to free water deficit -has improved with volume resuscitation  Acute blood loss anemia on anemia of chronic kidney disease - Suspected low-grade  upper GI bleed Patient had an episode of coffee-ground emesis during his hospital stay -  Eliquis discontinued - continue PPI  Gastric outlet obstruction versus gastroparesis versus severe constipation KUB suggested gastric distention suggestive of gastric outlet obstruction versus ileus - subsequent imaging also revealed a large amount of stool in the rectum - follow-up CT abdomen and pelvis noted eventual decompression of the stomach but confirmed persisting a large amount of stool in the rectum -with bowel regimen multiple bowel movements were accomplished - Reglan initiated - NG tube successfully discontinued 12/20  HTN Strict control not indicated in current clinical setting - goal will be to avoid extremes  Chronic indwelling Foley catheter secondary to BPH Continue finasteride and tamsulosin  Enterococcus faecalis in urine culture Has completed a course of antibiotic therapy  Chronic DVT on anticoagulation Eliquis had to be discontinued in setting of GI bleeding - ongoing use of anticoag at this time likely to cause more harm than benefit   Seizure disorder Continue Keppra  Hypomagnesemia Supplemented  Pressure injury POA Pressure Injury 09/07/22 Buttocks Right Stage 2 -  Partial thickness loss of dermis presenting as a shallow open injury with a red, pink wound bed without slough. (Active)  09/07/22 1129  Location: Buttocks  Location Orientation: Right  Staging: Stage 2 -  Partial thickness loss of dermis presenting as a shallow open injury with a red, pink wound bed without slough.  Wound Description (Comments):   Present on Admission: Yes    Goals of care Palliative care has met with the patient and his family during this hospital stay -DNR/NO CODE BLUE status has  been confirmed -the plan is for the patient to return home with his wife w/ home care - she now desires home Palliative Care, but is not quite ready for full comfort only care - hopeful for d/c home  09/16/22   Family Communication: Spoke with wife at bedside   Objective: Blood pressure (!) 148/77, pulse 63, temperature 98 F (36.7 C), temperature source Oral, resp. rate 16, SpO2 96 %.  Intake/Output Summary (Last 24 hours) at 09/15/2022 1107 Last data filed at 09/14/2022 1700 Gross per 24 hour  Intake --  Output 301 ml  Net -301 ml    There were no vitals filed for this visit.  Examination: General: No acute respiratory distress - sedate today, but appears comfortable Lungs: Clear to auscultation bilaterally - no wheeze Cardiovascular: Regular rate and rhythm without murmur  Abdomen: Nontender, nondistended, soft, bowel sounds positive, no rebound Extremities: persisting benight appearing dependent edema, most appreciable in scrotum/penis and B LE  CBC: Recent Labs  Lab 09/11/22 0343 09/12/22 0652 09/13/22 0218  WBC 8.4 8.1 9.3  HGB 8.0* 7.9* 8.1*  HCT 25.3* 24.4* 24.2*  MCV 94.1 92.1 91.7  PLT 141* 139* 136*    Basic Metabolic Panel: Recent Labs  Lab 09/11/22 0343 09/12/22 0652 09/13/22 0218  NA 147* 146* 143  K 3.6 3.3* 3.4*  CL 118* 114* 114*  CO2 _0 GLUCOSE 137* 119* 87  BUN 49* 38* 33*  CREATININE 2.38* 2.22* 2.22*  CALCIUM 9.1 9.1 8.8*  MG  --  1.4* 1.8    GFR: CrCl cannot be calculated (Unknown ideal weight.).   Scheduled Meds:  brimonidine  1 drop Both Eyes Daily   Chlorhexidine Gluconate Cloth  6 each Topical Daily   dorzolamide-timolol  1 drop Both Eyes Daily   finasteride  5 mg Oral Daily   latanoprost  1 drop Both Eyes QHS   levETIRAcetam  500 mg Oral BID   metoCLOPramide  5 mg Oral TID AC   metoprolol succinate  50 mg Oral QHS   pantoprazole  40 mg Oral BID   tamsulosin  0.4 mg Oral Daily     LOS: 9 days   Cherene Altes, MD [facility] Office  [phone] Pager - Text Page per Shea Evans  If 7PM-7AM, please contact night-coverage per Amion 09/15/2022, 11:07 AM

## 2022-09-15 NOTE — Progress Notes (Signed)
[facility]--the home-based palliative care division of [facility]--- Met at bedside with wife, [name] for a 2nd meeting per her request.  Pt slept through our visit.  She wanted to review hospice vs palliative care services.  We reviewed in detail the level of support/services with each service.  At this time wife has decided in favor of [facility] home-based palliative care instead of Hospice.  Will plan to f/u with [name] after pt's hospital d/c to set up initial [place] visit.  Please call [facility] if you have any questions or concerns at [phone] Wynetta Fines RN

## 2022-09-15 NOTE — Progress Notes (Signed)
Modified Barium Swallow Progress Note  Patient Details  Name: Darrell [name] MRN: [id number] Date of Birth: 1950-10-23  Today's Date: 09/15/2022  Modified Barium Swallow completed.  Full report located under Chart Review in the Imaging Section.  Brief recommendations include the following:  Clinical Impression  Pt's swallowing function appears to be likely similar to reported documentation from most recent MBS. He has largely oral deficits, including intermittent oral holding, and reduced lingual manipulation and propulsion. He has some premature spillage to the pharynx as well as diffuse oral residue that appears to be mixing with his saliva. There is little mastication when given tiny pieces of graham cracker mixed wtih puree. He has relatelive preserved pharyngeal function with the exception of mildly reduced pharyngeal squeeze, but his impaired timing impacts his airway protection with liquids still. When swallow response is more sluggish, seemingly correlated to moments of drowsiness or just fatigue as he continues to swallow, he cannot protect his airway quickly enough and he then aspirates both nectar thick liquids and thin liquids. It is only sensed with large volume aspiration with nectar thick liquids but his cough is too weak to clear anything from his trachea. Aspiration was not observed with current diet textures (purees, honey thick liquids).   Swallow Evaluation Recommendations       SLP Diet Recommendations: Dysphagia 1 (Puree) solids;Honey thick liquids (unless pt/wife want to continue to accept risk of aspiration as they have been offering thin liquids at home)   Liquid Administration via: Straw;Spoon   Medication Administration: Crushed with puree   Supervision: Staff to assist with self feeding;Full supervision/cueing for compensatory strategies   Compensations: Slow rate;Small sips/bites   Postural Changes: Seated upright at 90 degrees;Remain semi-upright after after  feeds/meals (Comment)   Oral Care Recommendations: Oral care BID        Osie Bond., M.A. [place] Office [phone]  Secure chat preferred  09/15/2022,10:54 AM

## 2022-09-15 NOTE — Progress Notes (Signed)
   Palliative Medicine Inpatient Follow Up Note   Patients wife has elected to go home with OP Palliative support as opposed to hospice services given patient improvements.  Plan to continue following along though at this time goals are clear.  No Charge ______________________________________________________________________________________ [place] Team Team Cell Phone: [phone] Please utilize secure chat with additional questions, if there is no response within 30 minutes please call the above phone number  Palliative Medicine Team providers are available by phone from 7am to 7pm daily and can be reached through the team cell phone.  Should this patient require assistance outside of these hours, please call the patient's attending physician.

## 2022-09-16 DIAGNOSIS — Z7401 Bed confinement status: Secondary | ICD-10-CM | POA: Diagnosis not present

## 2022-09-16 DIAGNOSIS — R531 Weakness: Secondary | ICD-10-CM | POA: Diagnosis not present

## 2022-09-16 DIAGNOSIS — Z743 Need for continuous supervision: Secondary | ICD-10-CM | POA: Diagnosis not present

## 2022-09-16 DIAGNOSIS — J9601 Acute respiratory failure with hypoxia: Secondary | ICD-10-CM | POA: Diagnosis not present

## 2022-09-16 MED ORDER — PANTOPRAZOLE SODIUM 40 MG PO TBEC: 40.0000 mg | DELAYED_RELEASE_TABLET | Freq: Two times a day (BID) | ORAL | 0 refills | Status: DC

## 2022-09-16 MED ORDER — METOCLOPRAMIDE HCL 5 MG PO TABS: 5.0000 mg | ORAL_TABLET | Freq: Three times a day (TID) | ORAL | 0 refills | Status: DC

## 2022-09-16 NOTE — Progress Notes (Signed)
   Palliative Medicine Inpatient Follow Up Note   Plan to discharge home this morning with home health and outpatient palliative support through [name].  No additional palliative needs at this time-goals of care clear.  No Charge ______________________________________________________________________________________ [place] Team Team Cell Phone: [phone] Please utilize secure chat with additional questions, if there is no response within 30 minutes please call the above phone number  Palliative Medicine Team providers are available by phone from 7am to 7pm daily and can be reached through the team cell phone.  Should this patient require assistance outside of these hours, please call the patient's attending physician.

## 2022-09-16 NOTE — TOC Transition Note (Signed)
Transition of Care [facility]) - CM/SW Discharge Note   Patient Details  Name: YSMAEL HIRES MRN: [id number] Date of Birth: 05-24-1951  [facility]) CM/SW Contact:  Bartholomew Crews, RN Phone Number: [phone] 09/16/2022, 10:48 AM   Clinical Narrative:     Notified of patient's readiness to transition home. Previous RN CM confirmed HH resumption with [facility] and palliative care with [facility]. Spoke with spouse on her cell phone - home address for ambulance transport confirmed. [facility]AR arranged. Medical transport form completed and printed to front nurses station - confirmed receipt. [place] orders faxed to [place] [phone]. Spoke with Benjamine Mola at [facility] to advise of transition today. No further TOC needs identified.   Final next level of care: Home w Home Health Services Barriers to Discharge: No Barriers Identified   Patient Goals and CMS Choice CMS Medicare.gov Compare Post Acute Care list provided to:: Patient Represenative (must comment) (to go home with hospice wife) Choice offered to / list presented to : Spouse  Discharge Placement                         Discharge Plan and Services Additional resources added to the After Visit Summary for     Discharge Planning Services: CM Consult Post Acute Care Choice: Hospice          DME Arranged:  ([facility])         [place] Arranged: RN, PT HH Agency: [place] Date [place]: 09/16/22 Time [place]: 2122 Representative spoke with at [place]: faxed [place] orders to [phone]  Social Determinants of Health ([place]) Interventions SDOH Screenings   Food Insecurity: No Food Insecurity (07/10/2022)  Housing: Low Risk  (07/07/2022)  Transportation Needs: No Transportation Needs (07/20/2022)  Utilities: Not At Risk (07/07/2022)  Depression (PHQ2-9): Low Risk  (01/24/2022)  Tobacco Use: High Risk (08/01/2022)     Readmission Risk Interventions    05/19/2022    3:24 PM   Readmission Risk Prevention Plan  Transportation Screening Complete  Medication Review ([place]) Complete  HRI or [place] Not Applicable

## 2022-09-16 NOTE — Discharge Summary (Signed)
Physician Discharge Summary  Darrell [name] OTL:[id number] DOB: 01-16-1951 DOA: 09/06/2022  PCP: [name], MD  Admit date: 09/06/2022 Discharge date: 09/16/2022  Admitted From: Home Disposition: Home with palliative care   Home Health: yes Equipment/Devices:none Discharge Condition:fairly stable  CODE STATUS: DNR Diet recommendation: Pured diet with honey thick liquids Brief/Interim Summary: 71 year old with a history of HTN, HLD, CVA with residual left-sided weakness, ALS, chronic dysphagia, and CKD stage IV, DVT on chronic Eliquis, and BPH with chronic Foley catheter who presented to the ER with shortness of breath. At baseline the patient is bedbound but does not require oxygen. On the date of his admission he was noted to have vomited x 2 and had subsequent intermittent cough. On arrival EMS found the patient to be saturating at 80% on room air. Chest x-ray was suggestive of left lower lobe infiltrate on presentation to the ER. Of note this represented his sixth hospitalization within the last 6 months.   Acute hypoxic respiratory failure -Due to pneumonitis and aspiration pneumonia-as evidenced by oxygen saturation of 80% with dyspnea at presentation.  Completed 7 days of empiric antibiotic.  COVID, RSV, flu all negative.-now resolved -weaned to room air   Chronic dysphagia as persisting effect of prior CVA -Resumed modified diet after discontinuation of NG tube 12/20 -tolerating diet   Acute kidney injury on CKD stage IV -Baseline creatinine approximately 2.4 -creatinine peaked at 3.6 during this admission -has returned to baseline with volume resuscitation  Hypernatremia -Due to free water deficit -has improved with volume resuscitation   Acute blood loss anemia on anemia of chronic kidney disease -Suspected low-grade upper GI bleed -Patient had an episode of coffee-ground emesis during his hospital stay - -Eliquis discontinued - continue PPI   Gastric outlet  obstruction versus gastroparesis versus severe constipation -KUB suggested gastric distention suggestive of gastric outlet obstruction versus ileus - subsequent imaging also revealed a large amount of stool in the rectum - follow-up CT abdomen and pelvis noted eventual decompression of the stomach but confirmed persisting a large amount of stool in the rectum -with bowel regimen multiple bowel movements were accomplished - Reglan initiated - NG tube successfully discontinued 12/20   HTN -Resumed home meds   Chronic indwelling Foley catheter secondary to BPH -Continued finasteride and tamsulosin   Enterococcus faecalis in urine culture -Has completed a course of antibiotic therapy   Chronic DVT on anticoagulation -Eliquis had to be discontinued in setting of GI bleeding - ongoing use of anticoag at this time likely to cause more harm than benefit    Seizure disorder -Continued Keppra   Hypomagnesemia Supplemented   Pressure injury POA Pressure Injury 09/07/22 Buttocks Right Stage 2 -  Partial thickness loss of dermis presenting as a shallow open injury with a red, pink wound bed without slough. (Active)  09/07/22 1129  Location: Buttocks  Location Orientation: Right  Staging: Stage 2 -  Partial thickness loss of dermis presenting as a shallow open injury with a red, pink wound bed without slough.  Wound Description (Comments):   Present on Admission: Yes    Goals of care Palliative care has met with the patient and his family during this hospital stay -DNR/NO CODE BLUE status has been confirmed -the plan is for the patient to return home with his wife w/ home care - she desires home Palliative Care   Discharge Diagnoses:  Acute hypoxemic respiratory failure in the setting of aspiration pneumonia/pneumonitis Chronic dysphagia in the setting of prior CVA AKI on  CKD stage IV Hyponatremia Acute blood loss anemia on anemia of chronic disease Gastric outlet obstruction versus  gastroparesis versus severe constipation Hypertension Chronic indwelling Foley catheter secondary to BPH Enterococcus faecalis UTI Chronic DVT on anticoagulation Seizure disorder Hypomagnesemia Pressure injury    Discharge Instructions  Discharge Instructions     Increase activity slowly   Complete by: As directed    No dressing needed   Complete by: As directed       Allergies as of 09/16/2022       Reactions   Ace Inhibitors Other (See Comments)   Hyperkalemia   Doxycycline Other (See Comments)   Hiccups, cough, nausea and emesis, elevated liver enzymes, elevated eosinophils, SOB concerning for early DRESS syndrome    Atacand Hct [candesartan Cilexetil-hctz] Hives   Shellfish Allergy Hives        Medication List     STOP taking these medications    Eliquis 5 MG Tabs tablet Generic drug: apixaban   ferrous sulfate 325 (65 FE) MG tablet Commonly known as: FeroSul   glycopyrrolate 1 MG tablet Commonly known as: ROBINUL   Misc. Devices Misc   Multi-Vitamin tablet   ondansetron 4 MG tablet Commonly known as: Zofran       TAKE these medications    albuterol 108 (90 Base) MCG/ACT inhaler Commonly known as: VENTOLIN HFA Inhale 2 puffs into the lungs every 6 (six) hours as needed for up to 30 days for Wheezing.   amLODipine 10 MG tablet Commonly known as: NORVASC Take 1 tablet (10 mg total) by mouth daily.   atorvastatin 40 MG tablet Commonly known as: LIPITOR Take 1 tablet (40 mg total) by mouth daily.   Breo Ellipta 200-25 MCG/ACT Aepb Generic drug: fluticasone furoate-vilanterol Inhale 2 puffs into the lungs daily as needed (for shortness of breath).   brimonidine 0.2 % ophthalmic solution Commonly known as: ALPHAGAN Place 1 drop into both eyes daily.   cetirizine 10 MG tablet Commonly known as: ZYRTEC Take 10 mg by mouth daily.   dicyclomine 10 MG capsule Commonly known as: BENTYL TAKE 1 CAPSULE BY MOUTH ONCE DAILY AS NEEDED What  changed: reasons to take this   dorzolamide-timolol 2-0.5 % ophthalmic solution Commonly known as: COSOPT Place 1 drop into both eyes daily.   finasteride 5 MG tablet Commonly known as: PROSCAR Take 5 mg by mouth daily.   hydrALAZINE 25 MG tablet Commonly known as: APRESOLINE Take 1 tablet (25 mg total) by mouth 3 (three) times daily.   latanoprost 0.005 % ophthalmic solution Commonly known as: XALATAN Place 1 drop into both eyes at bedtime.   levETIRAcetam 500 MG tablet Commonly known as: KEPPRA Take 1 tablet (500 mg total) by mouth 2 (two) times daily.   metoCLOPramide 5 MG tablet Commonly known as: REGLAN Take 1 tablet (5 mg total) by mouth 3 (three) times daily before meals.   metoprolol succinate 50 MG 24 hr tablet Commonly known as: TOPROL-XL Take 1 tablet (50 mg total) by mouth at bedtime. Take with or immediately following a meal.   montelukast 10 MG tablet Commonly known as: SINGULAIR Take 1 tablet by mouth at bedtime.   pantoprazole 40 MG tablet Commonly known as: PROTONIX Take 1 tablet (40 mg total) by mouth 2 (two) times daily.   tamsulosin 0.4 MG Caps capsule Commonly known as: FLOMAX Take 1 capsule (0.4 mg total) by mouth daily.   torsemide 20 MG tablet Commonly known as: DEMADEX Take 20 mg by mouth daily as  needed (fluid).               Discharge Care Instructions  (From admission, onward)           Start     Ordered   09/16/22 0000  No dressing needed        09/16/22 1035            Follow-up Information     [facility] Follow up.   Specialty: Home Health Services Contact information: [place] [address] [phone]         [facility] Follow up.   Specialty: PALLIATIVE CARE Contact information: [address] [place] [address] [phone]               Allergies  Allergen Reactions   Ace Inhibitors Other (See Comments)     Hyperkalemia   Doxycycline Other (See Comments)    Hiccups, cough, nausea and emesis, elevated liver enzymes, elevated eosinophils, SOB concerning for early DRESS syndrome    Atacand Hct [Candesartan Cilexetil-Hctz] Hives   Shellfish Allergy Hives    Consultations: Palliative care   Procedures/Studies: DG Swallowing Func-Speech Pathology  Result Date: 09/15/2022 Table formatting from the original result was not included. Objective Swallowing Evaluation: Type of Study: MBS-Modified Barium Swallow Study  Patient Details Name: GRACIANO BATSON MRN: [id number] Date of Birth: 1951/03/27 Today's Date: 09/15/2022 Time: SLP Start Time (ACUTE ONLY): 0900 -SLP Stop Time (ACUTE ONLY): 2725 SLP Time Calculation (min) (ACUTE ONLY): 25 min Past Medical History: Past Medical History: Diagnosis Date  Arthritis   Asthma   At high risk for falls 08/16/2015  CIDP (chronic inflammatory demyelinating polyneuropathy) ([name])   CKD (chronic kidney disease) stage 3, GFR 30-59 ml/min ([place]) 08/16/2015  Dysphagia as late effect of cerebrovascular disease   pts wife states pt has to eat soft foods   Elevated liver enzymes 08/10/2016  GERD (gastroesophageal reflux disease)   Glaucoma   High cholesterol   History of CVA with residual deficit 03/25/2013  Hypertension   Hypertensive retinopathy of both eyes 01/16/2017  Inguinal hernia 03/25/2013  Liver hemangioma 08/14/2016  New onset seizure ([place]) 07/08/2017  seizure 07/14/18  Nuclear sclerosis of both eyes 10/25/2016  Pneumonia   Presence of permanent cardiac pacemaker   Primary open angle glaucoma of both eyes, indeterminate stage 10/25/2016  Renal mass, right 08/14/2016  Status cardiac pacemaker 01/29/2017  Placed for second degree heart block on 01/16/17 Medtronic Azure XT DR MRI SureScan dual-chamber pacemaker  Stroke [facility])   2011 with residual deficit left sided weakness  Tobacco dependence  Past Surgical History: Past Surgical History: Procedure Laterality Date  EYE SURGERY     HERNIA REPAIR    INGUINAL HERNIA REPAIR Right 11/23/2014  Procedure: right inguinal hernia repair with mesh;  Surgeon: Armandina Gemma, MD;  Location: [facility];  Service: General;  Laterality: Right;  INSERTION OF MESH N/A 11/23/2014  Procedure: INSERTION OF MESH;  Surgeon: Armandina Gemma, MD;  Location: [facility];  Service: General;  Laterality: N/A;  JOINT REPLACEMENT    MASS EXCISION Left 08/29/2017  Procedure: EXCISION OF LEFT NECK MASS;  Surgeon: Coralie Keens, MD;  Location: [facility];  Service: General;  Laterality: Left;  PACEMAKER IMPLANT N/A 01/16/2017  Procedure: Pacemaker Implant;  Surgeon: Will Meredith Leeds, MD;  Location: [facility];  Service: Cardiovascular;  Laterality: N/A;  SHOULDER SURGERY Bilateral 1988, 1998 HPI: 71 y.o. male with medical history significant  of hypertension, hyperlipidemia, CVA with residual left-sided weakness, ALS, chronic dysphagia, CKD stage IV, DVT on Eliquis, BPH on chronic Foley catheter who presented with complaints of shortness of breath on 09/06/22. History was obtained from from the patient's wife at bedside who is his primary caregiver.  At baseline patient is not on oxygen, bedbound, and requires assistance with all of activities of daily life.  Regurgitation x2 noted prior to hospitalization.  No reported fevers or chills.  He has had intermittent cough that is unchanged from baseline.  Patient has a chronic indwelling Foley for which wife notes was recently changed.  Wife reports chronic dysphagia with MBS completed 03/24/22 recommending Dysphagia 1/honey-thickened liquids.  ST f/u to assess current swallow function.  Subjective: cooperative, needs cues; wife present  Recommendations for follow up therapy are one component of a multi-disciplinary discharge planning process, led by the attending physician.  Recommendations may be updated based on patient status, additional functional criteria and insurance authorization. Assessment / Plan / Recommendation   09/15/2022   10:55 AM Clinical Impressions SLP Visit Diagnosis Pt's swallowing function appears to be likely similar to reported documentation from most recent MBS. He has largely oral deficits, including intermittent oral holding, and reduced lingual manipulation and propulsion. He has some premature spillage to the pharynx as well as diffuse oral residue that appears to be mixing with his saliva. There is little mastication when given tiny pieces of graham cracker mixed wtih puree. He has relatelive preserved pharyngeal function with the exception of mildly reduced pharyngeal squeeze, but his impaired timing impacts his airway protection with liquids still. When swallow response is more sluggish, seemingly correlated to moments of drowsiness or just fatigue as he continues to swallow, he cannot protect his airway quickly enough and he then aspirates both nectar thick liquids and thin liquids. It is only sensed with large volume aspiration with nectar thick liquids but his cough is too weak to clear anything from his trachea. Aspiration was not observed with current diet textures (purees, honey thick liquids). Dysphagia, oropharyngeal phase (R13.12)     09/15/2022  10:00 AM Treatment Recommendations Treatment Recommendations Therapy as outlined in treatment plan below     09/15/2022  10:00 AM Prognosis Prognosis for Safe Diet Advancement Guarded Barriers to Reach Goals Time post onset;Severity of deficits   09/15/2022  10:55 AM Diet Recommendations Compensations Slow rate;Small sips/bites     09/15/2022  10:55 AM Other Recommendations Follow Up Recommendations Other (comment) Assistance recommended at discharge Frequent or constant Supervision/Assistance   09/15/2022  10:00 AM Frequency and Duration  Speech Therapy Frequency (ACUTE ONLY) min 1 x/week Treatment Duration 1 week     09/15/2022  10:00 AM Oral Phase Oral Phase Impaired Oral - Honey Cup Weak lingual manipulation;Reduced posterior propulsion;Holding of  bolus;Lingual/palatal residue;Delayed oral transit Oral - Nectar Straw -- Oral - Thin Teaspoon Decreased bolus cohesion;Premature spillage;Lingual/palatal residue Oral - Thin Straw Decreased bolus cohesion;Lingual/palatal residue;Premature spillage Oral - Puree Weak lingual manipulation;Reduced posterior propulsion;Holding of bolus;Lingual/palatal residue;Delayed oral transit Oral - Mech Soft Reduced posterior propulsion;Holding of bolus;Lingual/palatal residue;Delayed oral transit;Weak lingual manipulation;Impaired mastication    09/15/2022  10:00 AM Pharyngeal Phase Pharyngeal Phase Impaired Pharyngeal- Honey Cup Delayed swallow initiation-pyriform sinuses;Reduced pharyngeal peristalsis Pharyngeal- Nectar Straw Penetration/Aspiration before swallow Pharyngeal Material enters airway, passes BELOW cords and not ejected out despite cough attempt by patient Pharyngeal- Thin Teaspoon Reduced pharyngeal peristalsis Pharyngeal- Thin Straw Delayed swallow initiation-pyriform sinuses;Penetration/Aspiration before swallow Pharyngeal Material enters airway, passes BELOW cords without attempt by  patient to eject out (silent aspiration) Pharyngeal- Puree Reduced pharyngeal peristalsis Pharyngeal- Mechanical Soft Reduced pharyngeal peristalsis    09/15/2022  10:00 AM Cervical Esophageal Phase  Cervical Esophageal Phase Impaired Honey Cup Reduced cricopharyngeal relaxation Nectar Straw Reduced cricopharyngeal relaxation Thin Teaspoon Reduced cricopharyngeal relaxation Thin Straw Reduced cricopharyngeal relaxation Puree Reduced cricopharyngeal relaxation Mechanical Soft Reduced cricopharyngeal relaxation Osie Bond., M.A. CCC-SLP Acute Rehabilitation Services Office [phone] Secure chat preferred 09/15/2022, 11:04 AM                     DG Abd 1 View  Result Date: 09/13/2022 CLINICAL DATA:  Gastric outlet obstruction. EXAM: ABDOMEN - 1 VIEW COMPARISON:  September 09, 2022. FINDINGS: The bowel gas pattern is normal. Distal  tip of nasogastric tube is seen in expected position of distal stomach. No radio-opaque calculi or other significant radiographic abnormality are seen. IMPRESSION: Distal tip of nasogastric tube is seen in expected position of distal stomach. No abnormal bowel dilatation. Electronically Signed   By: Marijo Conception M.D.   On: 09/13/2022 08:53   CT ABDOMEN PELVIS WO CONTRAST  Result Date: 09/11/2022 CLINICAL DATA:  Bowel obstruction suspected. EXAM: CT ABDOMEN AND PELVIS WITHOUT CONTRAST TECHNIQUE: Multidetector CT imaging of the abdomen and pelvis was performed following the standard protocol without IV contrast. RADIATION DOSE REDUCTION: This exam was performed according to the departmental dose-optimization program which includes automated exposure control, adjustment of the mA and/or kV according to patient size and/or use of iterative reconstruction technique. COMPARISON:  CT abdomen and pelvis 09/07/2022 FINDINGS: Lower chest: There are small bilateral pleural effusions. There is airspace disease in the left lung base. There has been no significant interval change. Heart is enlarged. Hepatobiliary: Gallstones are present. No focal liver lesions are identified. No obvious biliary ductal dilatation. Pancreas: Unremarkable. No pancreatic ductal dilatation or surrounding inflammatory changes. Spleen: Normal in size without focal abnormality. Adrenals/Urinary Tract: The bladder is decompressed by Foley catheter. There is no hydronephrosis. Bilateral renal cysts, some of which are complex with calcifications, appear unchanged from the prior study. The adrenal glands are within normal limits. Stomach/Bowel: There is a large amount of stool in the rectum which has slightly decreased in amount when compared to the prior study. There is new rectosigmoid wall thickening with marked inflammatory stranding surrounding the rectum. There is sigmoid colon diverticulosis. There is no evidence for bowel obstruction,  pneumatosis or free air. Appendix is not seen. Nasogastric tube tip is in the mid stomach. The stomach is now decompressed. Vascular/Lymphatic: Aortic atherosclerosis. No enlarged abdominal or pelvic lymph nodes. Reproductive: Prostate is unremarkable. Other: There is no ascites or focal abdominal wall hernia. There is mild body wall edema, unchanged. Musculoskeletal: Degenerative changes affect the lumbar spine IMPRESSION: 1. Large amount of stool in the rectum has slightly decreased in amount when compared to the prior study. There is new rectosigmoid wall thickening with marked inflammatory stranding surrounding the rectum worrisome for stercoral colitis. 2. No evidence for bowel obstruction. 3. Cholelithiasis. 4. Stable small bilateral pleural effusions with left lower lobe airspace disease. 5. Stable mild body wall edema. 6. Nasogastric tube tip is in the mid stomach. The stomach is now decompressed. Aortic Atherosclerosis (ICD10-I70.0). Electronically Signed   By: Ronney Asters M.D.   On: 09/11/2022 01:37   DG Abd Portable 1V  Result Date: 09/09/2022 CLINICAL DATA:  Gastric outlet obstruction EXAM: PORTABLE ABDOMEN - 1 VIEW COMPARISON:  September 07, 2022 FINDINGS: The NG tube terminates in the stomach.  Persistent opacity in left base obscuring left hemidiaphragm. Prominent stool ball in the rectum. No other significant abnormalities. IMPRESSION: 1. The NG tube terminates in the stomach. 2. Prominent stool ball in the rectum. 3. Persistent opacity in left base. Electronically Signed   By: Dorise Bullion III M.D.   On: 09/09/2022 14:44   CT ABDOMEN PELVIS WO CONTRAST  Result Date: 09/07/2022 CLINICAL DATA:  Bowel obstruction suspected. EXAM: CT ABDOMEN AND PELVIS WITHOUT CONTRAST TECHNIQUE: Multidetector CT imaging of the abdomen and pelvis was performed following the standard protocol without IV contrast. RADIATION DOSE REDUCTION: This exam was performed according to the departmental  dose-optimization program which includes automated exposure control, adjustment of the mA and/or kV according to patient size and/or use of iterative reconstruction technique. COMPARISON:  10/07/2021 FINDINGS: Lower chest: Atelectasis or consolidation in the lung bases. Cardiac enlargement. Hepatobiliary: Stable appearance of 1.5 cm diameter low-attenuation lesion in segment 2 of the liver. This is likely a cyst. No imaging follow-up is indicated. No other focal lesions identified on noncontrast examination. Gallbladder and bile ducts are unremarkable. Pancreas: Unremarkable. No pancreatic ductal dilatation or surrounding inflammatory changes. Spleen: Normal in size without focal abnormality. Adrenals/Urinary Tract: Right adrenal gland nodule measuring 1.2 x 2.9 cm diameter. Density measures 1.3 Hounsfield units, likely indicating a benign fat containing adenoma. No change since prior study. Multiple bilateral exophytic renal lesions, largest in the left lower pole measuring 3.7 cm diameter. These are likely cysts and are unchanged since prior study. Several subcentimeter lesions are hyperintense, likely hemorrhagic. No imaging follow-up is indicated. No hydronephrosis or hydroureter. Bladder is decompressed with a Foley catheter in place. No abnormality is indicated. Stomach/Bowel: Stomach is fluid distended. An enteric tube is present with tip terminating in the body of the stomach. No gastric wall thickening. Small bowel and colon are not abnormally distended. No wall thickening is appreciated. Prominent stool in the rectum without significant rectal thickening. Appendix is normal. Vascular/Lymphatic: Aortic atherosclerosis. No enlarged abdominal or pelvic lymph nodes. Reproductive: Prostate is unremarkable. Other: No abdominal wall hernia or abnormality. No abdominopelvic ascites. Musculoskeletal: Degenerative changes in the spine and hips. No acute bony abnormalities. IMPRESSION: 1. No evidence of bowel  obstruction. 2. Fluid distended stomach with enteric tube in place. This could represent gastric obstruction or gastroparesis. 3. Aortic atherosclerosis. 4. Atelectasis or consolidation in the lung bases. Electronically Signed   By: Lucienne Capers M.D.   On: 09/07/2022 19:39   DG Abd 1 View  Result Date: 09/07/2022 CLINICAL DATA:  NG tube placement EXAM: ABDOMEN - 1 VIEW COMPARISON:  Previous studies including the examination done earlier today FINDINGS: There is interval placement of enteric tube with its tip in the region of body of stomach. There is interval decrease in degree of distention of stomach. There is no significant small bowel dilation. Gas and stool are noted in colon. IMPRESSION: Tip of enteric tube is seen in the region of body of stomach. There is interval decrease in degree of gastric distention. Electronically Signed   By: Elmer Picker M.D.   On: 09/07/2022 19:26   DG Abd 1 View  Result Date: 09/07/2022 CLINICAL DATA:  Vomiting EXAM: ABDOMEN - 1 VIEW COMPARISON:  06/30/2022 FINDINGS: There is marked distention of stomach. There are gas-filled small bowel loops without significant dilation. Gas and stool are noted in colon. Degenerative changes are noted in lumbar spine. There is increased density in left lower lung field obscuring the left hemidiaphragm. This may be due to infiltrate  and possibly pleural effusion in left lower lung field. IMPRESSION: There is marked distention of the stomach. This may be due to ileus or gastric outlet obstruction. Electronically Signed   By: Elmer Picker M.D.   On: 09/07/2022 14:50   DG Chest Port 1 View  Result Date: 09/06/2022 CLINICAL DATA:  Question sepsis EXAM: PORTABLE CHEST 1 VIEW COMPARISON:  Chest 08/11/2022 FINDINGS: Interval development of infiltrate in the left mid lung. Probable pneumonia. Left lower lobe airspace disease similar to the prior study may be atelectasis or pneumonia Mild right lower lobe airspace disease  unchanged likely atelectasis or infiltrate. No effusion. Negative for heart failure. IMPRESSION: Interval development of infiltrate in the left lung compatible with pneumonia. Bibasilar airspace disease unchanged. These findings may be due to atelectasis or additional pneumonia. Electronically Signed   By: Franchot Gallo M.D.   On: 09/06/2022 08:11      Subjective: Patient seen and examined.  Wife at the bedside.  Patient sleeping comfortably on the bed.  Wife wishes to take him home today.  No acute events overnight.  Discharge Exam: Vitals:   09/16/22 0515 09/16/22 0825  BP: (!) 162/69 (!) 152/76  Pulse: 60 60  Resp:  16  Temp: 98.1 F (36.7 C) 98.6 F (37 C)  SpO2: 97% 93%   Vitals:   09/15/22 1614 09/15/22 1941 09/16/22 0515 09/16/22 0825  BP: (!) 156/72 (!) 167/75 (!) 162/69 (!) 152/76  Pulse: 60 (!) 59 60 60  Resp: 16   16  Temp: 98.2 F (36.8 C) 98.1 F (36.7 C) 98.1 F (36.7 C) 98.6 F (37 C)  TempSrc: Oral Oral  Oral  SpO2: 95% 96% 97% 93%    General: Pt sleeping comfortably, not in acute distress, on room air, wife at the bedside. Cardiovascular: RRR, S1/S2 +, no rubs, no gallops Respiratory: CTA bilaterally, no wheezing, no rhonchi Abdominal: Soft, NT, ND, bowel sounds + scrotal edema noted.    The results of significant diagnostics from this hospitalization (including imaging, microbiology, ancillary and laboratory) are listed below for reference.     Microbiology: Recent Results (from the past 240 hour(s))  Culture, blood (single) w Reflex to ID Panel     Status: None   Collection Time: 09/06/22  5:03 PM   Specimen: BLOOD  Result Value Ref Range Status   Specimen Description BLOOD RIGHT ANTECUBITAL  Final   Special Requests   Final    BOTTLES DRAWN AEROBIC AND ANAEROBIC Blood Culture adequate volume   Culture   Final    NO GROWTH 5 DAYS Performed at [facility], [address], [place], [place] [address]    Report Status 09/11/2022 FINAL   Final     Labs: BNP (last 3 results) No results for input(s): "BNP" in the last 8760 hours. Basic Metabolic Panel: Recent Labs  Lab 09/10/22 0209 09/11/22 0343 09/12/22 0652 09/13/22 0218  NA 151* 147* 146* 143  K 3.8 3.6 3.3* 3.4*  CL 121* 118* 114* 114*  CO2 _0 GLUCOSE 161* 137* 119* 87  BUN 65* 49* 38* 33*  CREATININE 2.61* 2.38* 2.22* 2.22*  CALCIUM 9.2 9.1 9.1 8.8*  MG  --   --  1.4* 1.8   Liver Function Tests: No results for input(s): "AST", "ALT", "ALKPHOS", "BILITOT", "PROT", "ALBUMIN" in the last 168 hours. No results for input(s): "LIPASE", "AMYLASE" in the last 168 hours. No results for input(s): "AMMONIA" in the last 168 hours. CBC: Recent Labs  Lab 09/09/22 2134 09/10/22 0209 09/11/22 0343 09/12/22 0652 09/13/22 0218  WBC  --  7.8 8.4 8.1 9.3  HGB 8.6* 8.6* 8.0* 7.9* 8.1*  HCT 27.3* 26.4* 25.3* 24.4* 24.2*  MCV  --  92.6 94.1 92.1 91.7  PLT  --  163 141* 139* 136*   Cardiac Enzymes: No results for input(s): "CKTOTAL", "CKMB", "CKMBINDEX", "TROPONINI" in the last 168 hours. BNP: Invalid input(s): "POCBNP" CBG: No results for input(s): "GLUCAP" in the last 168 hours. D-Dimer No results for input(s): "DDIMER" in the last 72 hours. Hgb A1c No results for input(s): "HGBA1C" in the last 72 hours. Lipid Profile No results for input(s): "CHOL", "HDL", "LDLCALC", "TRIG", "CHOLHDL", "LDLDIRECT" in the last 72 hours. Thyroid function studies No results for input(s): "TSH", "T4TOTAL", "T3FREE", "THYROIDAB" in the last 72 hours.  Invalid input(s): "FREET3" Anemia work up No results for input(s): "VITAMINB12", "FOLATE", "FERRITIN", "TIBC", "IRON", "RETICCTPCT" in the last 72 hours. Urinalysis    Component Value Date/Time   COLORURINE YELLOW 09/06/2022 [place] 09/06/2022 0749   LABSPEC 1.020 09/06/2022 0749   PHURINE 5.0 09/06/2022 0749   GLUCOSEU NEGATIVE 09/06/2022 0749   HGBUR NEGATIVE 09/06/2022 0749   BILIRUBINUR  NEGATIVE 09/06/2022 0749   BILIRUBINUR negative 09/07/2017 1645   KETONESUR NEGATIVE 09/06/2022 0749   PROTEINUR NEGATIVE 09/06/2022 0749   UROBILINOGEN 0.2 09/07/2017 1645   UROBILINOGEN 4.0 (H) 08/04/2016 1517   NITRITE NEGATIVE 09/06/2022 0749   LEUKOCYTESUR TRACE (A) 09/06/2022 0749   Sepsis Labs Recent Labs  Lab 09/10/22 0209 09/11/22 0343 09/12/22 0652 09/13/22 0218  WBC 7.8 8.4 8.1 9.3   Microbiology Recent Results (from the past 240 hour(s))  Culture, blood (single) w Reflex to ID Panel     Status: None   Collection Time: 09/06/22  5:03 PM   Specimen: BLOOD  Result Value Ref Range Status   Specimen Description BLOOD RIGHT ANTECUBITAL  Final   Special Requests   Final    BOTTLES DRAWN AEROBIC AND ANAEROBIC Blood Culture adequate volume   Culture   Final    NO GROWTH 5 DAYS Performed at [facility], [place] [address], [place], [place] [address]    Report Status 09/11/2022 FINAL  Final     Time coordinating discharge: Over 30 minutes  SIGNED:   Mckinley Jewel, MD  [facility] 09/16/2022, 10:36 AM Pager   If 7PM-7AM, please contact night-coverage [email or web address]

## 2022-09-19 ENCOUNTER — Ambulatory Visit (INDEPENDENT_AMBULATORY_CARE_PROVIDER_SITE_OTHER): Payer: Medicare HMO

## 2022-09-19 DIAGNOSIS — I441 Atrioventricular block, second degree: Secondary | ICD-10-CM

## 2022-09-19 LAB — CUP PACEART REMOTE DEVICE CHECK
Battery Remaining Longevity: 50 mo
Battery Voltage: 2.95 V
Brady Statistic AP VP Percent: 60.22 %
Brady Statistic AP VS Percent: 2.34 %
Brady Statistic AS VP Percent: 35.36 %
Brady Statistic AS VS Percent: 2.08 %
Brady Statistic RA Percent Paced: 63.47 %
Brady Statistic RV Percent Paced: 95.59 %
Date Time Interrogation Session: 20231226010734
Implantable Lead Connection Status: 753985
Implantable Lead Connection Status: 753985
Implantable Lead Implant Date: 20180424
Implantable Lead Implant Date: 20180424
Implantable Lead Location: 753859
Implantable Lead Location: 753860
Implantable Lead Model: 5076
Implantable Lead Model: 5076
Implantable Pulse Generator Implant Date: 20180424
Lead Channel Impedance Value: 228 Ohm
Lead Channel Impedance Value: 285 Ohm
Lead Channel Impedance Value: 342 Ohm
Lead Channel Impedance Value: 342 Ohm
Lead Channel Pacing Threshold Amplitude: 0.625 V
Lead Channel Pacing Threshold Amplitude: 0.625 V
Lead Channel Pacing Threshold Pulse Width: 0.4 ms
Lead Channel Pacing Threshold Pulse Width: 0.4 ms
Lead Channel Sensing Intrinsic Amplitude: 1.875 mV
Lead Channel Sensing Intrinsic Amplitude: 1.875 mV
Lead Channel Sensing Intrinsic Amplitude: 5.75 mV
Lead Channel Sensing Intrinsic Amplitude: 5.75 mV
Lead Channel Setting Pacing Amplitude: 1.5 V
Lead Channel Setting Pacing Amplitude: 2.5 V
Lead Channel Setting Pacing Pulse Width: 0.4 ms
Lead Channel Setting Sensing Sensitivity: 2 mV
Zone Setting Status: 755011
Zone Setting Status: 755011

## 2022-09-20 DIAGNOSIS — R1312 Dysphagia, oropharyngeal phase: Secondary | ICD-10-CM | POA: Diagnosis not present

## 2022-09-20 DIAGNOSIS — I82402 Acute embolism and thrombosis of unspecified deep veins of left lower extremity: Secondary | ICD-10-CM | POA: Diagnosis not present

## 2022-09-20 DIAGNOSIS — G1221 Amyotrophic lateral sclerosis: Secondary | ICD-10-CM | POA: Diagnosis not present

## 2022-09-20 DIAGNOSIS — I69354 Hemiplegia and hemiparesis following cerebral infarction affecting left non-dominant side: Secondary | ICD-10-CM | POA: Diagnosis not present

## 2022-09-20 DIAGNOSIS — D1803 Hemangioma of intra-abdominal structures: Secondary | ICD-10-CM | POA: Diagnosis not present

## 2022-09-20 DIAGNOSIS — N39498 Other specified urinary incontinence: Secondary | ICD-10-CM | POA: Diagnosis not present

## 2022-09-20 DIAGNOSIS — N184 Chronic kidney disease, stage 4 (severe): Secondary | ICD-10-CM | POA: Diagnosis not present

## 2022-09-20 DIAGNOSIS — I69391 Dysphagia following cerebral infarction: Secondary | ICD-10-CM | POA: Diagnosis not present

## 2022-09-20 DIAGNOSIS — I82401 Acute embolism and thrombosis of unspecified deep veins of right lower extremity: Secondary | ICD-10-CM | POA: Diagnosis not present

## 2022-09-20 DIAGNOSIS — G40909 Epilepsy, unspecified, not intractable, without status epilepticus: Secondary | ICD-10-CM | POA: Diagnosis not present

## 2022-09-20 DIAGNOSIS — K311 Adult hypertrophic pyloric stenosis: Secondary | ICD-10-CM | POA: Diagnosis not present

## 2022-09-20 DIAGNOSIS — K5900 Constipation, unspecified: Secondary | ICD-10-CM | POA: Diagnosis not present

## 2022-09-20 DIAGNOSIS — N401 Enlarged prostate with lower urinary tract symptoms: Secondary | ICD-10-CM | POA: Diagnosis not present

## 2022-09-20 DIAGNOSIS — J9601 Acute respiratory failure with hypoxia: Secondary | ICD-10-CM | POA: Diagnosis not present

## 2022-09-20 DIAGNOSIS — I700 Atherosclerosis of aorta: Secondary | ICD-10-CM | POA: Diagnosis not present

## 2022-09-20 DIAGNOSIS — R338 Other retention of urine: Secondary | ICD-10-CM | POA: Diagnosis not present

## 2022-09-20 DIAGNOSIS — I129 Hypertensive chronic kidney disease with stage 1 through stage 4 chronic kidney disease, or unspecified chronic kidney disease: Secondary | ICD-10-CM | POA: Diagnosis not present

## 2022-09-20 DIAGNOSIS — J432 Centrilobular emphysema: Secondary | ICD-10-CM | POA: Diagnosis not present

## 2022-09-20 DIAGNOSIS — M1990 Unspecified osteoarthritis, unspecified site: Secondary | ICD-10-CM | POA: Diagnosis not present

## 2022-09-20 DIAGNOSIS — J4489 Other specified chronic obstructive pulmonary disease: Secondary | ICD-10-CM | POA: Diagnosis not present

## 2022-09-20 DIAGNOSIS — E782 Mixed hyperlipidemia: Secondary | ICD-10-CM | POA: Diagnosis not present

## 2022-09-20 DIAGNOSIS — D631 Anemia in chronic kidney disease: Secondary | ICD-10-CM | POA: Diagnosis not present

## 2022-09-20 DIAGNOSIS — I441 Atrioventricular block, second degree: Secondary | ICD-10-CM | POA: Diagnosis not present

## 2022-09-20 DIAGNOSIS — B952 Enterococcus as the cause of diseases classified elsewhere: Secondary | ICD-10-CM | POA: Diagnosis not present

## 2022-09-21 ENCOUNTER — Telehealth: Payer: Self-pay

## 2022-09-21 ENCOUNTER — Telehealth: Payer: Self-pay | Admitting: Family Medicine

## 2022-09-21 DIAGNOSIS — R1310 Dysphagia, unspecified: Secondary | ICD-10-CM | POA: Diagnosis not present

## 2022-09-21 DIAGNOSIS — K5909 Other constipation: Secondary | ICD-10-CM | POA: Diagnosis not present

## 2022-09-21 DIAGNOSIS — G1221 Amyotrophic lateral sclerosis: Secondary | ICD-10-CM | POA: Diagnosis not present

## 2022-09-21 DIAGNOSIS — N184 Chronic kidney disease, stage 4 (severe): Secondary | ICD-10-CM | POA: Diagnosis not present

## 2022-09-21 DIAGNOSIS — I509 Heart failure, unspecified: Secondary | ICD-10-CM | POA: Diagnosis not present

## 2022-09-21 DIAGNOSIS — R0902 Hypoxemia: Secondary | ICD-10-CM | POA: Diagnosis not present

## 2022-09-21 NOTE — Telephone Encounter (Signed)
Transition Care Management Follow-up Telephone Call  Call completed with patient's wife, [name].  Date of discharge and from where: 09/16/2022, [facility]  How have you been since you were released from the hospital? [name] said he is doing well.  Any questions or concerns? She explained that he has an appointment today with a new PCP who makes home visits. She said that her husband did not want hospice care yet, he would like to try the home visiting provider.   She still would like palliative care services and will call [facility] to resume services.   Items Reviewed: Did the pt receive and understand the discharge instructions provided? Yes  Medications obtained and verified? Yes - she said she has all of his medications and did not have any questions about the med regime  Other? No  Any new allergies since your discharge? No  Dietary orders reviewed? Yes- he has been tolerating the pureed diet with honey thick liquids. [name] said that he does best with the liquids when using a straw.  She also noted that he is eating a little less than usual.  Do you have support at home? Yes   Home Care and Equipment/Supplies: Were home health services ordered? yes If so, what is the name of the agency? [facility]  Has the agency set up a time to come to the patient's home? The nurse came to see him yesterday  Were any new equipment or medical supplies ordered?  No What is the name of the medical supply agency? N/a Were you able to get the supplies/equipment? not applicable Do you have any questions related to the use of the equipment or supplies? No  Functional Questionnaire: (I = Independent and D = Dependent) ADLs: dependent for all care.  [name] said she has all the necessary equipment.  He had O2 in the hospital but did not need it at home yet. The home health nurse checked his O2 sat yesterday.     Follow up appointments reviewed:  [facility] Hospital  f/u appt confirmed?  Patient has an appointment today with a new PCP with [facility]  who makes home visits.    [facility] f/u appt confirmed? Yes  Scheduled to see neurology/ ALS - 09/27/2022.   Are transportation arrangements needed?  His wife will arrange transportation  If their condition worsens, is the pt aware to call PCP or go to the Emergency Dept.? Yes Was the patient provided with contact information for the PCP's office or ED? Yes Was to pt encouraged to call back with questions or concerns? Yes

## 2022-09-21 NOTE — Telephone Encounter (Signed)
Cathy RN, calling from [facility] is calling to request verbal orders skilled nursing, 1W 6, PT 1 W 1, OT 1 W 1.  Pt went into the hospital 09/06/22-09/16/22. Rerequesting new orders.Pt was discharged. Needing a new admit  Please advise CB- [phone] Verbal ok on VM

## 2022-09-22 NOTE — Telephone Encounter (Signed)
Verbal order were given for patient.

## 2022-09-23 DIAGNOSIS — I10 Essential (primary) hypertension: Secondary | ICD-10-CM | POA: Diagnosis not present

## 2022-09-23 DIAGNOSIS — I509 Heart failure, unspecified: Secondary | ICD-10-CM | POA: Diagnosis not present

## 2022-09-23 DIAGNOSIS — E7800 Pure hypercholesterolemia, unspecified: Secondary | ICD-10-CM | POA: Diagnosis not present

## 2022-09-23 DIAGNOSIS — I2510 Atherosclerotic heart disease of native coronary artery without angina pectoris: Secondary | ICD-10-CM | POA: Diagnosis not present

## 2022-09-27 DIAGNOSIS — I69322 Dysarthria following cerebral infarction: Secondary | ICD-10-CM | POA: Diagnosis not present

## 2022-09-27 DIAGNOSIS — Z993 Dependence on wheelchair: Secondary | ICD-10-CM | POA: Diagnosis not present

## 2022-09-27 DIAGNOSIS — Z79899 Other long term (current) drug therapy: Secondary | ICD-10-CM | POA: Diagnosis not present

## 2022-09-27 DIAGNOSIS — I4891 Unspecified atrial fibrillation: Secondary | ICD-10-CM | POA: Diagnosis not present

## 2022-09-27 DIAGNOSIS — R339 Retention of urine, unspecified: Secondary | ICD-10-CM | POA: Diagnosis not present

## 2022-09-27 DIAGNOSIS — R69 Illness, unspecified: Secondary | ICD-10-CM | POA: Diagnosis not present

## 2022-09-27 DIAGNOSIS — R252 Cramp and spasm: Secondary | ICD-10-CM | POA: Diagnosis not present

## 2022-09-27 DIAGNOSIS — G1223 Primary lateral sclerosis: Secondary | ICD-10-CM | POA: Diagnosis not present

## 2022-09-27 DIAGNOSIS — I69391 Dysphagia following cerebral infarction: Secondary | ICD-10-CM | POA: Diagnosis not present

## 2022-09-27 DIAGNOSIS — Z9229 Personal history of other drug therapy: Secondary | ICD-10-CM | POA: Diagnosis not present

## 2022-09-28 DIAGNOSIS — R338 Other retention of urine: Secondary | ICD-10-CM | POA: Diagnosis not present

## 2022-09-28 DIAGNOSIS — I69391 Dysphagia following cerebral infarction: Secondary | ICD-10-CM | POA: Diagnosis not present

## 2022-09-28 DIAGNOSIS — I441 Atrioventricular block, second degree: Secondary | ICD-10-CM | POA: Diagnosis not present

## 2022-09-28 DIAGNOSIS — B952 Enterococcus as the cause of diseases classified elsewhere: Secondary | ICD-10-CM | POA: Diagnosis not present

## 2022-09-28 DIAGNOSIS — D631 Anemia in chronic kidney disease: Secondary | ICD-10-CM | POA: Diagnosis not present

## 2022-09-28 DIAGNOSIS — J4489 Other specified chronic obstructive pulmonary disease: Secondary | ICD-10-CM | POA: Diagnosis not present

## 2022-09-28 DIAGNOSIS — K311 Adult hypertrophic pyloric stenosis: Secondary | ICD-10-CM | POA: Diagnosis not present

## 2022-09-28 DIAGNOSIS — J9601 Acute respiratory failure with hypoxia: Secondary | ICD-10-CM | POA: Diagnosis not present

## 2022-09-28 DIAGNOSIS — E782 Mixed hyperlipidemia: Secondary | ICD-10-CM | POA: Diagnosis not present

## 2022-09-28 DIAGNOSIS — I69354 Hemiplegia and hemiparesis following cerebral infarction affecting left non-dominant side: Secondary | ICD-10-CM | POA: Diagnosis not present

## 2022-09-28 DIAGNOSIS — K5900 Constipation, unspecified: Secondary | ICD-10-CM | POA: Diagnosis not present

## 2022-09-28 DIAGNOSIS — N184 Chronic kidney disease, stage 4 (severe): Secondary | ICD-10-CM | POA: Diagnosis not present

## 2022-09-28 DIAGNOSIS — G1221 Amyotrophic lateral sclerosis: Secondary | ICD-10-CM | POA: Diagnosis not present

## 2022-09-28 DIAGNOSIS — I129 Hypertensive chronic kidney disease with stage 1 through stage 4 chronic kidney disease, or unspecified chronic kidney disease: Secondary | ICD-10-CM | POA: Diagnosis not present

## 2022-09-28 DIAGNOSIS — I82401 Acute embolism and thrombosis of unspecified deep veins of right lower extremity: Secondary | ICD-10-CM | POA: Diagnosis not present

## 2022-09-28 DIAGNOSIS — R1312 Dysphagia, oropharyngeal phase: Secondary | ICD-10-CM | POA: Diagnosis not present

## 2022-09-28 DIAGNOSIS — D1803 Hemangioma of intra-abdominal structures: Secondary | ICD-10-CM | POA: Diagnosis not present

## 2022-09-28 DIAGNOSIS — M1990 Unspecified osteoarthritis, unspecified site: Secondary | ICD-10-CM | POA: Diagnosis not present

## 2022-09-28 DIAGNOSIS — J432 Centrilobular emphysema: Secondary | ICD-10-CM | POA: Diagnosis not present

## 2022-09-28 DIAGNOSIS — N39498 Other specified urinary incontinence: Secondary | ICD-10-CM | POA: Diagnosis not present

## 2022-09-28 DIAGNOSIS — I700 Atherosclerosis of aorta: Secondary | ICD-10-CM | POA: Diagnosis not present

## 2022-09-28 DIAGNOSIS — G40909 Epilepsy, unspecified, not intractable, without status epilepticus: Secondary | ICD-10-CM | POA: Diagnosis not present

## 2022-09-28 DIAGNOSIS — N401 Enlarged prostate with lower urinary tract symptoms: Secondary | ICD-10-CM | POA: Diagnosis not present

## 2022-09-28 DIAGNOSIS — I82402 Acute embolism and thrombosis of unspecified deep veins of left lower extremity: Secondary | ICD-10-CM | POA: Diagnosis not present

## 2022-09-29 ENCOUNTER — Ambulatory Visit: Payer: Self-pay

## 2022-09-29 ENCOUNTER — Telehealth: Payer: Self-pay | Admitting: Pulmonary Disease

## 2022-09-29 DIAGNOSIS — I700 Atherosclerosis of aorta: Secondary | ICD-10-CM | POA: Diagnosis not present

## 2022-09-29 DIAGNOSIS — G40909 Epilepsy, unspecified, not intractable, without status epilepticus: Secondary | ICD-10-CM | POA: Diagnosis not present

## 2022-09-29 DIAGNOSIS — J432 Centrilobular emphysema: Secondary | ICD-10-CM | POA: Diagnosis not present

## 2022-09-29 DIAGNOSIS — I69391 Dysphagia following cerebral infarction: Secondary | ICD-10-CM | POA: Diagnosis not present

## 2022-09-29 DIAGNOSIS — G1221 Amyotrophic lateral sclerosis: Secondary | ICD-10-CM | POA: Diagnosis not present

## 2022-09-29 DIAGNOSIS — N184 Chronic kidney disease, stage 4 (severe): Secondary | ICD-10-CM | POA: Diagnosis not present

## 2022-09-29 DIAGNOSIS — I82402 Acute embolism and thrombosis of unspecified deep veins of left lower extremity: Secondary | ICD-10-CM | POA: Diagnosis not present

## 2022-09-29 DIAGNOSIS — E782 Mixed hyperlipidemia: Secondary | ICD-10-CM | POA: Diagnosis not present

## 2022-09-29 DIAGNOSIS — N401 Enlarged prostate with lower urinary tract symptoms: Secondary | ICD-10-CM | POA: Diagnosis not present

## 2022-09-29 DIAGNOSIS — R1312 Dysphagia, oropharyngeal phase: Secondary | ICD-10-CM | POA: Diagnosis not present

## 2022-09-29 DIAGNOSIS — I441 Atrioventricular block, second degree: Secondary | ICD-10-CM | POA: Diagnosis not present

## 2022-09-29 DIAGNOSIS — J4489 Other specified chronic obstructive pulmonary disease: Secondary | ICD-10-CM | POA: Diagnosis not present

## 2022-09-29 DIAGNOSIS — D631 Anemia in chronic kidney disease: Secondary | ICD-10-CM | POA: Diagnosis not present

## 2022-09-29 DIAGNOSIS — R338 Other retention of urine: Secondary | ICD-10-CM | POA: Diagnosis not present

## 2022-09-29 DIAGNOSIS — I82401 Acute embolism and thrombosis of unspecified deep veins of right lower extremity: Secondary | ICD-10-CM | POA: Diagnosis not present

## 2022-09-29 DIAGNOSIS — B952 Enterococcus as the cause of diseases classified elsewhere: Secondary | ICD-10-CM | POA: Diagnosis not present

## 2022-09-29 DIAGNOSIS — D1803 Hemangioma of intra-abdominal structures: Secondary | ICD-10-CM | POA: Diagnosis not present

## 2022-09-29 DIAGNOSIS — I129 Hypertensive chronic kidney disease with stage 1 through stage 4 chronic kidney disease, or unspecified chronic kidney disease: Secondary | ICD-10-CM | POA: Diagnosis not present

## 2022-09-29 DIAGNOSIS — K311 Adult hypertrophic pyloric stenosis: Secondary | ICD-10-CM | POA: Diagnosis not present

## 2022-09-29 DIAGNOSIS — I69354 Hemiplegia and hemiparesis following cerebral infarction affecting left non-dominant side: Secondary | ICD-10-CM | POA: Diagnosis not present

## 2022-09-29 DIAGNOSIS — N39498 Other specified urinary incontinence: Secondary | ICD-10-CM | POA: Diagnosis not present

## 2022-09-29 DIAGNOSIS — M1990 Unspecified osteoarthritis, unspecified site: Secondary | ICD-10-CM | POA: Diagnosis not present

## 2022-09-29 DIAGNOSIS — K5900 Constipation, unspecified: Secondary | ICD-10-CM | POA: Diagnosis not present

## 2022-09-29 DIAGNOSIS — J9601 Acute respiratory failure with hypoxia: Secondary | ICD-10-CM | POA: Diagnosis not present

## 2022-09-29 NOTE — Patient Instructions (Signed)
Visit Information  Thank you for taking time to visit with me today. Please don't hesitate to contact me if I can be of assistance to you.   Following are the goals we discussed today:   Goals Addressed             This Visit's Progress    COMPLETED: I want to keep him healthy       Care Coordination Interventions: Completed successful outbound call with patient's wife [name] Determined patient completed his initial visit with [facility] and will continue to use this provider for primary care Discussed patient and wife are very thankful for the service provided by this RN and the [place] team involved in their care  Determined wife [name] spoke with PCP provider Dr. Margarita Rana to advise of patient's transition to [place]         If you are experiencing a Mental Health or [place] or need someone to talk to, please go to [facility] [address] [place] [phone])  Patient verbalizes understanding of instructions and care plan provided today and agrees to view in [place]. Active MyChart status and patient understanding of how to access instructions and care plan via MyChart confirmed with patient.     Barb Merino, RN, BSN, CCM Care Management Coordinator [facility] Care Management Direct Phone: [phone]

## 2022-09-29 NOTE — Patient Outreach (Signed)
  Care Coordination   Follow Up Visit Note   09/29/2022 Name: Darrell [name] MRN: [id number] DOB: 06-29-1951  Darrell [name] is a 72 y.o. year old male who sees Darrell Rakes, MD for primary care. I spoke with wife Darrell [name] by phone today.  What matters to the patients health and wellness today?  Patient has switched his primary care to [facility].     Goals Addressed             This Visit's Progress    COMPLETED: I want to keep him healthy       Care Coordination Interventions: Completed successful outbound call with patient's wife Darrell [name] Determined patient completed his initial visit with [facility] and will continue to use this provider for primary care Discussed patient and wife are very thankful for the service provided by this RN and the [place] team involved in their care  Determined wife Darrell [name] spoke with PCP provider Darrell [name] to advise of patient's transition to [place]         SDOH assessments and interventions completed:  No     Care Coordination Interventions:  Yes, provided   Follow up plan: No further intervention required.   Encounter Outcome:  Pt. Visit Completed

## 2022-09-29 NOTE — Telephone Encounter (Signed)
Spoke with patients wife she is wanting to know if patient needs to still be taking Azithromycin. She is wondering if this will be along term medication or is he being weaned.   Dr. Ander Slade please advise

## 2022-10-02 ENCOUNTER — Ambulatory Visit: Payer: Self-pay | Admitting: *Deleted

## 2022-10-02 NOTE — Telephone Encounter (Signed)
ATC X1 LVM asking patient to call us back

## 2022-10-02 NOTE — Telephone Encounter (Signed)
Long-term medication  Azithromycin 2530. 3 times a week  It is used in people with advanced COPD to reduce risk of exacerbation

## 2022-10-02 NOTE — Telephone Encounter (Signed)
Call received from [place], [place] from [facility], [phone] to confirm when patient admitted to [facility] 09/20/22 patient was not being treated for pneumonia, UTI or pressure injury. Upon assessment patient issues were resolved . In review of chart, no documentation noted patient was receiving continued treatment for pneumonia, UTI or pressure related injuries. Please advise if patient is still being treated. Upon assessment from [place], [place] on 09/20/22 no antibiotics were being taking by patient nor sx of UTI noted , foley present in patient. No pressure injuries noted to skin.  Tye Maryland , RN requesting skilled nursing  order for PT/OT 1 week 1 and evaluate and treat for therapy. Please advise and order PT/OT.    Reason for Disposition  [1] Caller requesting NON-URGENT health information AND [2] PCP's office is the best resource  Answer Assessment - Initial Assessment Questions 1. REASON FOR CALL or QUESTION: "What is your reason for calling today?" or "How can I best help you?" or "What question do you have that I can help answer?"     Tye Maryland, RN from [facility] calling to notify PCP and verify patient is not being treated for pneumonia, UTI or pressure injuries after 09/21/22.  Protocols used: Information Only Call - No Triage-A-AH

## 2022-10-03 MED ORDER — AZITHROMYCIN 250 MG PO TABS: 250.0000 mg | ORAL_TABLET | ORAL | 3 refills | Status: DC

## 2022-10-03 NOTE — Telephone Encounter (Signed)
Pt's spouse returned call and I let her know about the message from Dr. Jenetta Downer. Rx for azithromycin has been sent to preferred pharmacy. Nothing further needed.

## 2022-10-06 DIAGNOSIS — R1310 Dysphagia, unspecified: Secondary | ICD-10-CM | POA: Diagnosis not present

## 2022-10-06 DIAGNOSIS — K5900 Constipation, unspecified: Secondary | ICD-10-CM | POA: Diagnosis not present

## 2022-10-06 DIAGNOSIS — N401 Enlarged prostate with lower urinary tract symptoms: Secondary | ICD-10-CM | POA: Diagnosis not present

## 2022-10-06 DIAGNOSIS — N39498 Other specified urinary incontinence: Secondary | ICD-10-CM | POA: Diagnosis not present

## 2022-10-06 DIAGNOSIS — M1990 Unspecified osteoarthritis, unspecified site: Secondary | ICD-10-CM | POA: Diagnosis not present

## 2022-10-06 DIAGNOSIS — J4489 Other specified chronic obstructive pulmonary disease: Secondary | ICD-10-CM | POA: Diagnosis not present

## 2022-10-06 DIAGNOSIS — J432 Centrilobular emphysema: Secondary | ICD-10-CM | POA: Diagnosis not present

## 2022-10-06 DIAGNOSIS — I509 Heart failure, unspecified: Secondary | ICD-10-CM | POA: Diagnosis not present

## 2022-10-06 DIAGNOSIS — I82402 Acute embolism and thrombosis of unspecified deep veins of left lower extremity: Secondary | ICD-10-CM | POA: Diagnosis not present

## 2022-10-06 DIAGNOSIS — D1803 Hemangioma of intra-abdominal structures: Secondary | ICD-10-CM | POA: Diagnosis not present

## 2022-10-06 DIAGNOSIS — R1312 Dysphagia, oropharyngeal phase: Secondary | ICD-10-CM | POA: Diagnosis not present

## 2022-10-06 DIAGNOSIS — N184 Chronic kidney disease, stage 4 (severe): Secondary | ICD-10-CM | POA: Diagnosis not present

## 2022-10-06 DIAGNOSIS — G40909 Epilepsy, unspecified, not intractable, without status epilepticus: Secondary | ICD-10-CM | POA: Diagnosis not present

## 2022-10-06 DIAGNOSIS — J9601 Acute respiratory failure with hypoxia: Secondary | ICD-10-CM | POA: Diagnosis not present

## 2022-10-06 DIAGNOSIS — I129 Hypertensive chronic kidney disease with stage 1 through stage 4 chronic kidney disease, or unspecified chronic kidney disease: Secondary | ICD-10-CM | POA: Diagnosis not present

## 2022-10-06 DIAGNOSIS — D631 Anemia in chronic kidney disease: Secondary | ICD-10-CM | POA: Diagnosis not present

## 2022-10-06 DIAGNOSIS — B952 Enterococcus as the cause of diseases classified elsewhere: Secondary | ICD-10-CM | POA: Diagnosis not present

## 2022-10-06 DIAGNOSIS — R0902 Hypoxemia: Secondary | ICD-10-CM | POA: Diagnosis not present

## 2022-10-06 DIAGNOSIS — I700 Atherosclerosis of aorta: Secondary | ICD-10-CM | POA: Diagnosis not present

## 2022-10-06 DIAGNOSIS — Z8673 Personal history of transient ischemic attack (TIA), and cerebral infarction without residual deficits: Secondary | ICD-10-CM | POA: Diagnosis not present

## 2022-10-06 DIAGNOSIS — I441 Atrioventricular block, second degree: Secondary | ICD-10-CM | POA: Diagnosis not present

## 2022-10-06 DIAGNOSIS — E782 Mixed hyperlipidemia: Secondary | ICD-10-CM | POA: Diagnosis not present

## 2022-10-06 DIAGNOSIS — K5909 Other constipation: Secondary | ICD-10-CM | POA: Diagnosis not present

## 2022-10-06 DIAGNOSIS — I82401 Acute embolism and thrombosis of unspecified deep veins of right lower extremity: Secondary | ICD-10-CM | POA: Diagnosis not present

## 2022-10-06 DIAGNOSIS — K311 Adult hypertrophic pyloric stenosis: Secondary | ICD-10-CM | POA: Diagnosis not present

## 2022-10-06 DIAGNOSIS — G1221 Amyotrophic lateral sclerosis: Secondary | ICD-10-CM | POA: Diagnosis not present

## 2022-10-06 DIAGNOSIS — I130 Hypertensive heart and chronic kidney disease with heart failure and stage 1 through stage 4 chronic kidney disease, or unspecified chronic kidney disease: Secondary | ICD-10-CM | POA: Diagnosis not present

## 2022-10-06 DIAGNOSIS — I69354 Hemiplegia and hemiparesis following cerebral infarction affecting left non-dominant side: Secondary | ICD-10-CM | POA: Diagnosis not present

## 2022-10-06 DIAGNOSIS — I69391 Dysphagia following cerebral infarction: Secondary | ICD-10-CM | POA: Diagnosis not present

## 2022-10-06 DIAGNOSIS — R338 Other retention of urine: Secondary | ICD-10-CM | POA: Diagnosis not present

## 2022-10-06 DIAGNOSIS — G894 Chronic pain syndrome: Secondary | ICD-10-CM | POA: Diagnosis not present

## 2022-10-11 DIAGNOSIS — G1221 Amyotrophic lateral sclerosis: Secondary | ICD-10-CM | POA: Diagnosis not present

## 2022-10-11 NOTE — Progress Notes (Signed)
Remote pacemaker transmission.   

## 2022-10-12 DIAGNOSIS — B952 Enterococcus as the cause of diseases classified elsewhere: Secondary | ICD-10-CM | POA: Diagnosis not present

## 2022-10-12 DIAGNOSIS — J432 Centrilobular emphysema: Secondary | ICD-10-CM | POA: Diagnosis not present

## 2022-10-12 DIAGNOSIS — G1221 Amyotrophic lateral sclerosis: Secondary | ICD-10-CM | POA: Diagnosis not present

## 2022-10-12 DIAGNOSIS — E782 Mixed hyperlipidemia: Secondary | ICD-10-CM | POA: Diagnosis not present

## 2022-10-12 DIAGNOSIS — J9601 Acute respiratory failure with hypoxia: Secondary | ICD-10-CM | POA: Diagnosis not present

## 2022-10-12 DIAGNOSIS — I700 Atherosclerosis of aorta: Secondary | ICD-10-CM | POA: Diagnosis not present

## 2022-10-12 DIAGNOSIS — K311 Adult hypertrophic pyloric stenosis: Secondary | ICD-10-CM | POA: Diagnosis not present

## 2022-10-12 DIAGNOSIS — M1990 Unspecified osteoarthritis, unspecified site: Secondary | ICD-10-CM | POA: Diagnosis not present

## 2022-10-12 DIAGNOSIS — D1803 Hemangioma of intra-abdominal structures: Secondary | ICD-10-CM | POA: Diagnosis not present

## 2022-10-12 DIAGNOSIS — D631 Anemia in chronic kidney disease: Secondary | ICD-10-CM | POA: Diagnosis not present

## 2022-10-12 DIAGNOSIS — G40909 Epilepsy, unspecified, not intractable, without status epilepticus: Secondary | ICD-10-CM | POA: Diagnosis not present

## 2022-10-12 DIAGNOSIS — I129 Hypertensive chronic kidney disease with stage 1 through stage 4 chronic kidney disease, or unspecified chronic kidney disease: Secondary | ICD-10-CM | POA: Diagnosis not present

## 2022-10-12 DIAGNOSIS — N184 Chronic kidney disease, stage 4 (severe): Secondary | ICD-10-CM | POA: Diagnosis not present

## 2022-10-12 DIAGNOSIS — I69391 Dysphagia following cerebral infarction: Secondary | ICD-10-CM | POA: Diagnosis not present

## 2022-10-12 DIAGNOSIS — K5900 Constipation, unspecified: Secondary | ICD-10-CM | POA: Diagnosis not present

## 2022-10-12 DIAGNOSIS — N39498 Other specified urinary incontinence: Secondary | ICD-10-CM | POA: Diagnosis not present

## 2022-10-12 DIAGNOSIS — I82401 Acute embolism and thrombosis of unspecified deep veins of right lower extremity: Secondary | ICD-10-CM | POA: Diagnosis not present

## 2022-10-12 DIAGNOSIS — R338 Other retention of urine: Secondary | ICD-10-CM | POA: Diagnosis not present

## 2022-10-12 DIAGNOSIS — I82402 Acute embolism and thrombosis of unspecified deep veins of left lower extremity: Secondary | ICD-10-CM | POA: Diagnosis not present

## 2022-10-12 DIAGNOSIS — R1312 Dysphagia, oropharyngeal phase: Secondary | ICD-10-CM | POA: Diagnosis not present

## 2022-10-12 DIAGNOSIS — I69354 Hemiplegia and hemiparesis following cerebral infarction affecting left non-dominant side: Secondary | ICD-10-CM | POA: Diagnosis not present

## 2022-10-12 DIAGNOSIS — I441 Atrioventricular block, second degree: Secondary | ICD-10-CM | POA: Diagnosis not present

## 2022-10-12 DIAGNOSIS — J4489 Other specified chronic obstructive pulmonary disease: Secondary | ICD-10-CM | POA: Diagnosis not present

## 2022-10-12 DIAGNOSIS — N401 Enlarged prostate with lower urinary tract symptoms: Secondary | ICD-10-CM | POA: Diagnosis not present

## 2022-10-19 DIAGNOSIS — D1803 Hemangioma of intra-abdominal structures: Secondary | ICD-10-CM | POA: Diagnosis not present

## 2022-10-19 DIAGNOSIS — I10 Essential (primary) hypertension: Secondary | ICD-10-CM | POA: Diagnosis not present

## 2022-10-19 DIAGNOSIS — R1312 Dysphagia, oropharyngeal phase: Secondary | ICD-10-CM | POA: Diagnosis not present

## 2022-10-19 DIAGNOSIS — N400 Enlarged prostate without lower urinary tract symptoms: Secondary | ICD-10-CM | POA: Diagnosis not present

## 2022-10-19 DIAGNOSIS — I82402 Acute embolism and thrombosis of unspecified deep veins of left lower extremity: Secondary | ICD-10-CM | POA: Diagnosis not present

## 2022-10-19 DIAGNOSIS — I69391 Dysphagia following cerebral infarction: Secondary | ICD-10-CM | POA: Diagnosis not present

## 2022-10-19 DIAGNOSIS — N401 Enlarged prostate with lower urinary tract symptoms: Secondary | ICD-10-CM | POA: Diagnosis not present

## 2022-10-19 DIAGNOSIS — J432 Centrilobular emphysema: Secondary | ICD-10-CM | POA: Diagnosis not present

## 2022-10-19 DIAGNOSIS — I129 Hypertensive chronic kidney disease with stage 1 through stage 4 chronic kidney disease, or unspecified chronic kidney disease: Secondary | ICD-10-CM | POA: Diagnosis not present

## 2022-10-19 DIAGNOSIS — K5900 Constipation, unspecified: Secondary | ICD-10-CM | POA: Diagnosis not present

## 2022-10-19 DIAGNOSIS — N39498 Other specified urinary incontinence: Secondary | ICD-10-CM | POA: Diagnosis not present

## 2022-10-19 DIAGNOSIS — I441 Atrioventricular block, second degree: Secondary | ICD-10-CM | POA: Diagnosis not present

## 2022-10-19 DIAGNOSIS — J9601 Acute respiratory failure with hypoxia: Secondary | ICD-10-CM | POA: Diagnosis not present

## 2022-10-19 DIAGNOSIS — I2510 Atherosclerotic heart disease of native coronary artery without angina pectoris: Secondary | ICD-10-CM | POA: Diagnosis not present

## 2022-10-19 DIAGNOSIS — B952 Enterococcus as the cause of diseases classified elsewhere: Secondary | ICD-10-CM | POA: Diagnosis not present

## 2022-10-19 DIAGNOSIS — D631 Anemia in chronic kidney disease: Secondary | ICD-10-CM | POA: Diagnosis not present

## 2022-10-19 DIAGNOSIS — N184 Chronic kidney disease, stage 4 (severe): Secondary | ICD-10-CM | POA: Diagnosis not present

## 2022-10-19 DIAGNOSIS — R338 Other retention of urine: Secondary | ICD-10-CM | POA: Diagnosis not present

## 2022-10-19 DIAGNOSIS — E782 Mixed hyperlipidemia: Secondary | ICD-10-CM | POA: Diagnosis not present

## 2022-10-19 DIAGNOSIS — K311 Adult hypertrophic pyloric stenosis: Secondary | ICD-10-CM | POA: Diagnosis not present

## 2022-10-19 DIAGNOSIS — E7800 Pure hypercholesterolemia, unspecified: Secondary | ICD-10-CM | POA: Diagnosis not present

## 2022-10-19 DIAGNOSIS — I509 Heart failure, unspecified: Secondary | ICD-10-CM | POA: Diagnosis not present

## 2022-10-19 DIAGNOSIS — G40909 Epilepsy, unspecified, not intractable, without status epilepticus: Secondary | ICD-10-CM | POA: Diagnosis not present

## 2022-10-19 DIAGNOSIS — M1990 Unspecified osteoarthritis, unspecified site: Secondary | ICD-10-CM | POA: Diagnosis not present

## 2022-10-19 DIAGNOSIS — G1221 Amyotrophic lateral sclerosis: Secondary | ICD-10-CM | POA: Diagnosis not present

## 2022-10-19 DIAGNOSIS — G894 Chronic pain syndrome: Secondary | ICD-10-CM | POA: Diagnosis not present

## 2022-10-19 DIAGNOSIS — I82401 Acute embolism and thrombosis of unspecified deep veins of right lower extremity: Secondary | ICD-10-CM | POA: Diagnosis not present

## 2022-10-19 DIAGNOSIS — I700 Atherosclerosis of aorta: Secondary | ICD-10-CM | POA: Diagnosis not present

## 2022-10-19 DIAGNOSIS — J4489 Other specified chronic obstructive pulmonary disease: Secondary | ICD-10-CM | POA: Diagnosis not present

## 2022-10-19 DIAGNOSIS — I69354 Hemiplegia and hemiparesis following cerebral infarction affecting left non-dominant side: Secondary | ICD-10-CM | POA: Diagnosis not present

## 2022-10-24 ENCOUNTER — Telehealth: Payer: Self-pay | Admitting: Diagnostic Neuroimaging

## 2022-10-24 NOTE — Telephone Encounter (Signed)
LVM and sent mychart msg informing pt of appt change- MD out.

## 2022-10-25 ENCOUNTER — Ambulatory Visit: Payer: Medicare HMO | Admitting: Podiatry

## 2022-10-26 DIAGNOSIS — R338 Other retention of urine: Secondary | ICD-10-CM | POA: Diagnosis not present

## 2022-10-26 DIAGNOSIS — E782 Mixed hyperlipidemia: Secondary | ICD-10-CM | POA: Diagnosis not present

## 2022-10-26 DIAGNOSIS — N401 Enlarged prostate with lower urinary tract symptoms: Secondary | ICD-10-CM | POA: Diagnosis not present

## 2022-10-26 DIAGNOSIS — I441 Atrioventricular block, second degree: Secondary | ICD-10-CM | POA: Diagnosis not present

## 2022-10-26 DIAGNOSIS — I82402 Acute embolism and thrombosis of unspecified deep veins of left lower extremity: Secondary | ICD-10-CM | POA: Diagnosis not present

## 2022-10-26 DIAGNOSIS — G40909 Epilepsy, unspecified, not intractable, without status epilepticus: Secondary | ICD-10-CM | POA: Diagnosis not present

## 2022-10-26 DIAGNOSIS — J9601 Acute respiratory failure with hypoxia: Secondary | ICD-10-CM | POA: Diagnosis not present

## 2022-10-26 DIAGNOSIS — K311 Adult hypertrophic pyloric stenosis: Secondary | ICD-10-CM | POA: Diagnosis not present

## 2022-10-26 DIAGNOSIS — D631 Anemia in chronic kidney disease: Secondary | ICD-10-CM | POA: Diagnosis not present

## 2022-10-26 DIAGNOSIS — K5900 Constipation, unspecified: Secondary | ICD-10-CM | POA: Diagnosis not present

## 2022-10-26 DIAGNOSIS — R1312 Dysphagia, oropharyngeal phase: Secondary | ICD-10-CM | POA: Diagnosis not present

## 2022-10-26 DIAGNOSIS — N39498 Other specified urinary incontinence: Secondary | ICD-10-CM | POA: Diagnosis not present

## 2022-10-26 DIAGNOSIS — J4489 Other specified chronic obstructive pulmonary disease: Secondary | ICD-10-CM | POA: Diagnosis not present

## 2022-10-26 DIAGNOSIS — I69391 Dysphagia following cerebral infarction: Secondary | ICD-10-CM | POA: Diagnosis not present

## 2022-10-26 DIAGNOSIS — D1803 Hemangioma of intra-abdominal structures: Secondary | ICD-10-CM | POA: Diagnosis not present

## 2022-10-26 DIAGNOSIS — J432 Centrilobular emphysema: Secondary | ICD-10-CM | POA: Diagnosis not present

## 2022-10-26 DIAGNOSIS — I700 Atherosclerosis of aorta: Secondary | ICD-10-CM | POA: Diagnosis not present

## 2022-10-26 DIAGNOSIS — I69354 Hemiplegia and hemiparesis following cerebral infarction affecting left non-dominant side: Secondary | ICD-10-CM | POA: Diagnosis not present

## 2022-10-26 DIAGNOSIS — N184 Chronic kidney disease, stage 4 (severe): Secondary | ICD-10-CM | POA: Diagnosis not present

## 2022-10-26 DIAGNOSIS — I129 Hypertensive chronic kidney disease with stage 1 through stage 4 chronic kidney disease, or unspecified chronic kidney disease: Secondary | ICD-10-CM | POA: Diagnosis not present

## 2022-10-26 DIAGNOSIS — G1221 Amyotrophic lateral sclerosis: Secondary | ICD-10-CM | POA: Diagnosis not present

## 2022-10-26 DIAGNOSIS — I82401 Acute embolism and thrombosis of unspecified deep veins of right lower extremity: Secondary | ICD-10-CM | POA: Diagnosis not present

## 2022-10-26 DIAGNOSIS — M1990 Unspecified osteoarthritis, unspecified site: Secondary | ICD-10-CM | POA: Diagnosis not present

## 2022-10-26 DIAGNOSIS — B952 Enterococcus as the cause of diseases classified elsewhere: Secondary | ICD-10-CM | POA: Diagnosis not present

## 2022-10-30 DIAGNOSIS — R1310 Dysphagia, unspecified: Secondary | ICD-10-CM | POA: Diagnosis not present

## 2022-10-30 DIAGNOSIS — I69328 Other speech and language deficits following cerebral infarction: Secondary | ICD-10-CM | POA: Diagnosis not present

## 2022-10-30 DIAGNOSIS — R0902 Hypoxemia: Secondary | ICD-10-CM | POA: Diagnosis not present

## 2022-10-30 DIAGNOSIS — I509 Heart failure, unspecified: Secondary | ICD-10-CM | POA: Diagnosis not present

## 2022-10-30 DIAGNOSIS — K219 Gastro-esophageal reflux disease without esophagitis: Secondary | ICD-10-CM | POA: Diagnosis not present

## 2022-10-30 DIAGNOSIS — N184 Chronic kidney disease, stage 4 (severe): Secondary | ICD-10-CM | POA: Diagnosis not present

## 2022-10-30 DIAGNOSIS — Z008 Encounter for other general examination: Secondary | ICD-10-CM | POA: Diagnosis not present

## 2022-10-30 DIAGNOSIS — I4891 Unspecified atrial fibrillation: Secondary | ICD-10-CM | POA: Diagnosis not present

## 2022-10-30 DIAGNOSIS — E785 Hyperlipidemia, unspecified: Secondary | ICD-10-CM | POA: Diagnosis not present

## 2022-10-30 DIAGNOSIS — H409 Unspecified glaucoma: Secondary | ICD-10-CM | POA: Diagnosis not present

## 2022-10-30 DIAGNOSIS — Z7401 Bed confinement status: Secondary | ICD-10-CM | POA: Diagnosis not present

## 2022-10-30 DIAGNOSIS — I129 Hypertensive chronic kidney disease with stage 1 through stage 4 chronic kidney disease, or unspecified chronic kidney disease: Secondary | ICD-10-CM | POA: Diagnosis not present

## 2022-10-30 DIAGNOSIS — R69 Illness, unspecified: Secondary | ICD-10-CM | POA: Diagnosis not present

## 2022-10-30 DIAGNOSIS — N400 Enlarged prostate without lower urinary tract symptoms: Secondary | ICD-10-CM | POA: Diagnosis not present

## 2022-10-30 DIAGNOSIS — G1221 Amyotrophic lateral sclerosis: Secondary | ICD-10-CM | POA: Diagnosis not present

## 2022-10-30 DIAGNOSIS — F411 Generalized anxiety disorder: Secondary | ICD-10-CM | POA: Diagnosis not present

## 2022-10-30 DIAGNOSIS — M1990 Unspecified osteoarthritis, unspecified site: Secondary | ICD-10-CM | POA: Diagnosis not present

## 2022-10-30 DIAGNOSIS — K581 Irritable bowel syndrome with constipation: Secondary | ICD-10-CM | POA: Diagnosis not present

## 2022-10-30 DIAGNOSIS — L89159 Pressure ulcer of sacral region, unspecified stage: Secondary | ICD-10-CM | POA: Diagnosis not present

## 2022-10-30 DIAGNOSIS — I130 Hypertensive heart and chronic kidney disease with heart failure and stage 1 through stage 4 chronic kidney disease, or unspecified chronic kidney disease: Secondary | ICD-10-CM | POA: Diagnosis not present

## 2022-10-30 DIAGNOSIS — E1142 Type 2 diabetes mellitus with diabetic polyneuropathy: Secondary | ICD-10-CM | POA: Diagnosis not present

## 2022-10-30 DIAGNOSIS — J309 Allergic rhinitis, unspecified: Secondary | ICD-10-CM | POA: Diagnosis not present

## 2022-11-02 DIAGNOSIS — R338 Other retention of urine: Secondary | ICD-10-CM | POA: Diagnosis not present

## 2022-11-02 DIAGNOSIS — J432 Centrilobular emphysema: Secondary | ICD-10-CM | POA: Diagnosis not present

## 2022-11-02 DIAGNOSIS — R1312 Dysphagia, oropharyngeal phase: Secondary | ICD-10-CM | POA: Diagnosis not present

## 2022-11-02 DIAGNOSIS — I82401 Acute embolism and thrombosis of unspecified deep veins of right lower extremity: Secondary | ICD-10-CM | POA: Diagnosis not present

## 2022-11-02 DIAGNOSIS — E782 Mixed hyperlipidemia: Secondary | ICD-10-CM | POA: Diagnosis not present

## 2022-11-02 DIAGNOSIS — B952 Enterococcus as the cause of diseases classified elsewhere: Secondary | ICD-10-CM | POA: Diagnosis not present

## 2022-11-02 DIAGNOSIS — I69391 Dysphagia following cerebral infarction: Secondary | ICD-10-CM | POA: Diagnosis not present

## 2022-11-02 DIAGNOSIS — J4489 Other specified chronic obstructive pulmonary disease: Secondary | ICD-10-CM | POA: Diagnosis not present

## 2022-11-02 DIAGNOSIS — N39498 Other specified urinary incontinence: Secondary | ICD-10-CM | POA: Diagnosis not present

## 2022-11-02 DIAGNOSIS — J9601 Acute respiratory failure with hypoxia: Secondary | ICD-10-CM | POA: Diagnosis not present

## 2022-11-02 DIAGNOSIS — K5900 Constipation, unspecified: Secondary | ICD-10-CM | POA: Diagnosis not present

## 2022-11-02 DIAGNOSIS — I129 Hypertensive chronic kidney disease with stage 1 through stage 4 chronic kidney disease, or unspecified chronic kidney disease: Secondary | ICD-10-CM | POA: Diagnosis not present

## 2022-11-02 DIAGNOSIS — G40909 Epilepsy, unspecified, not intractable, without status epilepticus: Secondary | ICD-10-CM | POA: Diagnosis not present

## 2022-11-02 DIAGNOSIS — I441 Atrioventricular block, second degree: Secondary | ICD-10-CM | POA: Diagnosis not present

## 2022-11-02 DIAGNOSIS — I700 Atherosclerosis of aorta: Secondary | ICD-10-CM | POA: Diagnosis not present

## 2022-11-02 DIAGNOSIS — N401 Enlarged prostate with lower urinary tract symptoms: Secondary | ICD-10-CM | POA: Diagnosis not present

## 2022-11-02 DIAGNOSIS — M1990 Unspecified osteoarthritis, unspecified site: Secondary | ICD-10-CM | POA: Diagnosis not present

## 2022-11-02 DIAGNOSIS — I82402 Acute embolism and thrombosis of unspecified deep veins of left lower extremity: Secondary | ICD-10-CM | POA: Diagnosis not present

## 2022-11-02 DIAGNOSIS — I69354 Hemiplegia and hemiparesis following cerebral infarction affecting left non-dominant side: Secondary | ICD-10-CM | POA: Diagnosis not present

## 2022-11-02 DIAGNOSIS — N184 Chronic kidney disease, stage 4 (severe): Secondary | ICD-10-CM | POA: Diagnosis not present

## 2022-11-02 DIAGNOSIS — D1803 Hemangioma of intra-abdominal structures: Secondary | ICD-10-CM | POA: Diagnosis not present

## 2022-11-02 DIAGNOSIS — D631 Anemia in chronic kidney disease: Secondary | ICD-10-CM | POA: Diagnosis not present

## 2022-11-02 DIAGNOSIS — G1221 Amyotrophic lateral sclerosis: Secondary | ICD-10-CM | POA: Diagnosis not present

## 2022-11-02 DIAGNOSIS — K311 Adult hypertrophic pyloric stenosis: Secondary | ICD-10-CM | POA: Diagnosis not present

## 2022-11-03 DIAGNOSIS — R338 Other retention of urine: Secondary | ICD-10-CM | POA: Diagnosis not present

## 2022-11-06 ENCOUNTER — Encounter: Payer: Self-pay | Admitting: Cardiology

## 2022-11-06 ENCOUNTER — Ambulatory Visit: Payer: Medicare HMO | Attending: Cardiology | Admitting: Cardiology

## 2022-11-06 VITALS — BP 126/80 | HR 64

## 2022-11-06 DIAGNOSIS — I441 Atrioventricular block, second degree: Secondary | ICD-10-CM | POA: Diagnosis not present

## 2022-11-06 MED ORDER — METOPROLOL SUCCINATE ER 50 MG PO TB24: 50.0000 mg | ORAL_TABLET | Freq: Every day | ORAL | 2 refills | Status: DC

## 2022-11-06 NOTE — Patient Instructions (Addendum)
Medication Instructions:  Your physician recommends that you continue on your current medications as directed. Please refer to the Current Medication list given to you today.  *If you need a refill on your cardiac medications before your next appointment, please call your pharmacy*   Lab Work: None ordered   Testing/Procedures: None ordered   Follow-Up: At [facility], you and your health needs are our priority.  As part of our continuing mission to provide you with exceptional heart care, we have created designated Provider Care Teams.  These Care Teams include your primary Cardiologist (physician) and Advanced Practice Providers (APPs -  Physician Assistants and Nurse Practitioners) who all work together to provide you with the care you need, when you need it.  Remote monitoring is used to monitor your Pacemaker or ICD from home. This monitoring reduces the number of office visits required to check your device to one time per year. It allows [place] to keep an eye on the functioning of your device to ensure it is working properly. You are scheduled for a device check from home on 11/28/2022. You may send your transmission at any time that day. If you have a wireless device, the transmission will be sent automatically. After your physician reviews your transmission, you will receive a postcard with your next transmission date.  Your next appointment:   1 year(s)  The format for your next appointment:   In Person  Provider:   Allegra Lai, MD{or   Thank you for choosing [facility]!!   Trinidad Curet, RN [phone]

## 2022-11-06 NOTE — Progress Notes (Signed)
Electrophysiology Office Note   Date:  11/06/2022   ID:  Darrell [name], DOB [birth year], MRN [id number]  PCP:  [name], MD  Cardiologist:  none Primary Electrophysiologist:  Rozelia Catapano Meredith Leeds, MD    No chief complaint on file.    History of Present Illness: Darrell [name] is a 72 y.o. male who is being seen today for the evaluation of heart block at the request of [name], MD. Presenting today for electrophysiology evaluation.  To the hospital with low heart rates and was found to be in 2-1 AV block.  He is status post Medtronic dual-chamber pacemaker implanted 01/16/2017.  He has a history significant for hypertension, hyperlipidemia, CVA with residual left-sided weakness, ALS, stage IV CKD, DVT.  He presented to the December 2023 hospital with acute hypoxic respiratory failure and was found to have aspiration pneumonitis.  Today, denies symptoms of palpitations, chest pain, shortness of breath, orthopnea, PND, lower extremity edema, claudication, dizziness, presyncope, syncope, bleeding, or neurologic sequela. The patient is tolerating medications without difficulties.     Past Medical History:  Diagnosis Date   Arthritis    Asthma    At high risk for falls 08/16/2015   CIDP (chronic inflammatory demyelinating polyneuropathy) ([name])    CKD (chronic kidney disease) stage 3, GFR 30-59 ml/min ([name]) 08/16/2015   Dysphagia as late effect of cerebrovascular disease    pts wife states pt has to eat soft foods    Elevated liver enzymes 08/10/2016   GERD (gastroesophageal reflux disease)    Glaucoma    High cholesterol    History of CVA with residual deficit 03/25/2013   Hypertension    Hypertensive retinopathy of both eyes 01/16/2017   Inguinal hernia 03/25/2013   Liver hemangioma 08/14/2016   New onset seizure ([place]) 07/08/2017   seizure 07/14/18   Nuclear sclerosis of both eyes 10/25/2016   Pneumonia    Presence of permanent cardiac pacemaker    Primary open  angle glaucoma of both eyes, indeterminate stage 10/25/2016   Renal mass, right 08/14/2016   Status cardiac pacemaker 01/29/2017   Placed for second degree heart block on 01/16/17 Medtronic Azure XT DR MRI SureScan dual-chamber pacemaker   Stroke [facility])    2011 with residual deficit left sided weakness   Tobacco dependence    Past Surgical History:  Procedure Laterality Date   EYE SURGERY     HERNIA REPAIR     INGUINAL HERNIA REPAIR Right 11/23/2014   Procedure: right inguinal hernia repair with mesh;  Surgeon: Armandina Gemma, MD;  Location: [facility];  Service: General;  Laterality: Right;   INSERTION OF MESH N/A 11/23/2014   Procedure: INSERTION OF MESH;  Surgeon: Armandina Gemma, MD;  Location: [facility];  Service: General;  Laterality: N/A;   JOINT REPLACEMENT     MASS EXCISION Left 08/29/2017   Procedure: EXCISION OF LEFT NECK MASS;  Surgeon: Coralie Keens, MD;  Location: [facility];  Service: General;  Laterality: Left;   PACEMAKER IMPLANT N/A 01/16/2017   Procedure: Pacemaker Implant;  Surgeon: Casey Maxfield Meredith Leeds, MD;  Location: [facility];  Service: Cardiovascular;  Laterality: N/A;   SHOULDER SURGERY Bilateral 1988, 1998     Current Outpatient Medications  Medication Sig Dispense Refill   albuterol (VENTOLIN HFA) 108 (90 Base) MCG/ACT inhaler Inhale 2 puffs into the lungs every 6 (six) hours as needed for up to 30 days for Wheezing. 18 g 5   amLODipine (NORVASC) 10 MG tablet Take 1  tablet (10 mg total) by mouth daily. 90 tablet 1   atorvastatin (LIPITOR) 40 MG tablet Take 1 tablet (40 mg total) by mouth daily. 90 tablet 1   azithromycin (ZITHROMAX) 250 MG tablet Take 1 tablet (250 mg total) by mouth 3 (three) times a week. Take on Mon, Wed, Fri 13 tablet 3   brimonidine (ALPHAGAN) 0.2 % ophthalmic solution Place 1 drop into both eyes daily.     cetirizine (ZYRTEC) 10 MG tablet Take 10 mg by mouth daily.     dicyclomine (BENTYL) 10 MG capsule TAKE 1 CAPSULE BY MOUTH ONCE DAILY AS  NEEDED (Patient taking differently: Take 10 mg by mouth daily as needed for spasms.) 90 capsule 0   Docusate Sodium 100 MG capsule Take 100 mg by mouth.     dorzolamide-timolol (COSOPT) 22.3-6.8 MG/ML ophthalmic solution Place 1 drop into both eyes daily.     finasteride (PROSCAR) 5 MG tablet Take 5 mg by mouth daily.     fluticasone furoate-vilanterol (BREO ELLIPTA) 200-25 MCG/ACT AEPB Inhale 2 puffs into the lungs daily as needed (for shortness of breath).     glycopyrrolate (ROBINUL) 1 MG tablet Take 1 mg by mouth 2 (two) times daily.     hydrALAZINE (APRESOLINE) 25 MG tablet Take 1 tablet (25 mg total) by mouth 3 (three) times daily. 180 tablet 3   latanoprost (XALATAN) 0.005 % ophthalmic solution Place 1 drop into both eyes at bedtime.     levETIRAcetam (KEPPRA) 500 MG tablet Take 1 tablet (500 mg total) by mouth 2 (two) times daily. 180 tablet 4   montelukast (SINGULAIR) 10 MG tablet Take 1 tablet by mouth at bedtime.     Multiple Vitamin (MULTI VITAMIN) TABS 1 tablet Orally Once a day     polyethylene glycol powder (MIRALAX) 17 GM/SCOOP powder 1 scoop mixed with 8 ounces of fluid Orally Once a day     tamsulosin (FLOMAX) 0.4 MG CAPS capsule Take 1 capsule (0.4 mg total) by mouth daily. 90 capsule 1   torsemide (DEMADEX) 20 MG tablet Take 20 mg by mouth daily as needed (fluid).     metoCLOPramide (REGLAN) 5 MG tablet Take 1 tablet (5 mg total) by mouth 3 (three) times daily before meals. 90 tablet 0   metoprolol succinate (TOPROL-XL) 50 MG 24 hr tablet Take 1 tablet (50 mg total) by mouth at bedtime. Take with or immediately following a meal. 90 tablet 2   pantoprazole (PROTONIX) 40 MG tablet Take 1 tablet (40 mg total) by mouth 2 (two) times daily. 60 tablet 0   No current facility-administered medications for this visit.    Allergies:   Ace inhibitors, Doxycycline, Atacand hct [candesartan cilexetil-hctz], and Shellfish allergy   Social History:  The patient  reports that he has been  smoking cigarettes. He has a 10.00 pack-year smoking history. He has never used smokeless tobacco. He reports that he does not drink alcohol and does not use drugs.   Family History:  The patient's family history includes COPD in his sister; Cancer in his sister; Diabetes in his sister; Hypertension in his mother and sister.   ROS:  Please see the history of present illness.   Otherwise, review of systems is positive for none.   All other systems are reviewed and negative.   PHYSICAL EXAM: VS:  BP 126/80   Pulse 64   SpO2 97%  , BMI There is no height or weight on file to calculate BMI. GEN: Well nourished, well developed,  in no acute distress  HEENT: normal  Neck: no JVD, carotid bruits, or masses Cardiac: RRR; no murmurs, rubs, or gallops,no edema  Respiratory:  clear to auscultation bilaterally, normal work of breathing GI: soft, nontender, nondistended, + BS MS: no deformity or atrophy  Skin: warm and dry, device site well healed Neuro:  Strength and sensation are intact Psych: euthymic mood, full affect  EKG:  EKG is not ordered today. Personal review of the ekg ordered 09/07/22 shows A sense,V pace  Personal review of the device interrogation today. Results in [place]: 09/06/2022: ALT 15 09/13/2022: BUN 33; Creatinine, Ser 2.22; Hemoglobin 8.1; Magnesium 1.8; Platelets 136; Potassium 3.4; Sodium 143    Lipid Panel     Component Value Date/Time   CHOL 112 06/13/2018 0954   TRIG 51 06/13/2018 0954   HDL 40 06/13/2018 0954   CHOLHDL 2.8 06/13/2018 0954   CHOLHDL 4.1 08/16/2015 1555   VLDL 23 08/16/2015 1555   LDLCALC 62 06/13/2018 0954     Wt Readings from Last 3 Encounters:  08/12/22 172 lb 6.4 oz (78.2 kg)  08/01/22 172 lb (78 kg)  07/05/22 180 lb (81.6 kg)      Other studies Reviewed: Additional studies/ records that were reviewed today include: TTE 2014  Review of the above records today demonstrates:  - Left ventricle: The cavity size was  normal. There was   severe concentric and mild asymmetric hypertrophy.   Systolic function was normal. Wall motion was normal;   there were no regional wall motion abnormalities. Doppler   parameters are consistent with abnormal left ventricular   relaxation (grade 1 diastolic dysfunction). - Aortic root: The aortic root was mildly dilated. - Right atrium: The atrium was mildly dilated. - Pulmonary arteries: Systolic pressure was mildly   increased. PA peak pressure: 63m Hg (S).  ASSESSMENT AND PLAN:  1.  Secondary AV block: Status post Medtronic dual-chamber pacemaker.  Device function appropriately.  No changes at this time.  2.  Hypertension: well controlled  3.  PVCs: Elevated burden.  In ventricular bigeminy.  Due to ongoing medical conditions, Miley Blanchett continue with current management and not plan for suppressive therapy.  Current medicines are reviewed at length with the patient today.   The patient does not have concerns regarding his medicines.  The following changes were made today: none  Labs/ tests ordered today include:  No orders of the defined types were placed in this encounter.    Disposition:   FU 12 months  Signed, Royce Sciara MMeredith Leeds MD  11/06/2022 4:39 PM     [phone]) ([phone])

## 2022-11-09 DIAGNOSIS — I69354 Hemiplegia and hemiparesis following cerebral infarction affecting left non-dominant side: Secondary | ICD-10-CM | POA: Diagnosis not present

## 2022-11-09 DIAGNOSIS — J9601 Acute respiratory failure with hypoxia: Secondary | ICD-10-CM | POA: Diagnosis not present

## 2022-11-09 DIAGNOSIS — N184 Chronic kidney disease, stage 4 (severe): Secondary | ICD-10-CM | POA: Diagnosis not present

## 2022-11-09 DIAGNOSIS — I700 Atherosclerosis of aorta: Secondary | ICD-10-CM | POA: Diagnosis not present

## 2022-11-09 DIAGNOSIS — N39498 Other specified urinary incontinence: Secondary | ICD-10-CM | POA: Diagnosis not present

## 2022-11-09 DIAGNOSIS — R1312 Dysphagia, oropharyngeal phase: Secondary | ICD-10-CM | POA: Diagnosis not present

## 2022-11-09 DIAGNOSIS — J432 Centrilobular emphysema: Secondary | ICD-10-CM | POA: Diagnosis not present

## 2022-11-09 DIAGNOSIS — E782 Mixed hyperlipidemia: Secondary | ICD-10-CM | POA: Diagnosis not present

## 2022-11-09 DIAGNOSIS — I82401 Acute embolism and thrombosis of unspecified deep veins of right lower extremity: Secondary | ICD-10-CM | POA: Diagnosis not present

## 2022-11-09 DIAGNOSIS — D1803 Hemangioma of intra-abdominal structures: Secondary | ICD-10-CM | POA: Diagnosis not present

## 2022-11-09 DIAGNOSIS — G1221 Amyotrophic lateral sclerosis: Secondary | ICD-10-CM | POA: Diagnosis not present

## 2022-11-09 DIAGNOSIS — B952 Enterococcus as the cause of diseases classified elsewhere: Secondary | ICD-10-CM | POA: Diagnosis not present

## 2022-11-09 DIAGNOSIS — R471 Dysarthria and anarthria: Secondary | ICD-10-CM | POA: Diagnosis not present

## 2022-11-09 DIAGNOSIS — K311 Adult hypertrophic pyloric stenosis: Secondary | ICD-10-CM | POA: Diagnosis not present

## 2022-11-09 DIAGNOSIS — G40909 Epilepsy, unspecified, not intractable, without status epilepticus: Secondary | ICD-10-CM | POA: Diagnosis not present

## 2022-11-09 DIAGNOSIS — I441 Atrioventricular block, second degree: Secondary | ICD-10-CM | POA: Diagnosis not present

## 2022-11-09 DIAGNOSIS — M1990 Unspecified osteoarthritis, unspecified site: Secondary | ICD-10-CM | POA: Diagnosis not present

## 2022-11-09 DIAGNOSIS — I82402 Acute embolism and thrombosis of unspecified deep veins of left lower extremity: Secondary | ICD-10-CM | POA: Diagnosis not present

## 2022-11-09 DIAGNOSIS — I129 Hypertensive chronic kidney disease with stage 1 through stage 4 chronic kidney disease, or unspecified chronic kidney disease: Secondary | ICD-10-CM | POA: Diagnosis not present

## 2022-11-09 DIAGNOSIS — R338 Other retention of urine: Secondary | ICD-10-CM | POA: Diagnosis not present

## 2022-11-09 DIAGNOSIS — K5900 Constipation, unspecified: Secondary | ICD-10-CM | POA: Diagnosis not present

## 2022-11-09 DIAGNOSIS — D631 Anemia in chronic kidney disease: Secondary | ICD-10-CM | POA: Diagnosis not present

## 2022-11-09 DIAGNOSIS — N401 Enlarged prostate with lower urinary tract symptoms: Secondary | ICD-10-CM | POA: Diagnosis not present

## 2022-11-09 DIAGNOSIS — I69391 Dysphagia following cerebral infarction: Secondary | ICD-10-CM | POA: Diagnosis not present

## 2022-11-09 DIAGNOSIS — J4489 Other specified chronic obstructive pulmonary disease: Secondary | ICD-10-CM | POA: Diagnosis not present

## 2022-11-11 DIAGNOSIS — R351 Nocturia: Secondary | ICD-10-CM | POA: Diagnosis not present

## 2022-11-11 DIAGNOSIS — E261 Secondary hyperaldosteronism: Secondary | ICD-10-CM | POA: Diagnosis not present

## 2022-11-11 DIAGNOSIS — B351 Tinea unguium: Secondary | ICD-10-CM | POA: Diagnosis not present

## 2022-11-11 DIAGNOSIS — R159 Full incontinence of feces: Secondary | ICD-10-CM | POA: Diagnosis not present

## 2022-11-11 DIAGNOSIS — Z978 Presence of other specified devices: Secondary | ICD-10-CM | POA: Diagnosis not present

## 2022-11-11 DIAGNOSIS — G1221 Amyotrophic lateral sclerosis: Secondary | ICD-10-CM | POA: Diagnosis not present

## 2022-11-11 DIAGNOSIS — H548 Legal blindness, as defined in USA: Secondary | ICD-10-CM | POA: Diagnosis not present

## 2022-11-11 DIAGNOSIS — M19241 Secondary osteoarthritis, right hand: Secondary | ICD-10-CM | POA: Diagnosis not present

## 2022-11-11 DIAGNOSIS — R5383 Other fatigue: Secondary | ICD-10-CM | POA: Diagnosis not present

## 2022-11-11 DIAGNOSIS — M19242 Secondary osteoarthritis, left hand: Secondary | ICD-10-CM | POA: Diagnosis not present

## 2022-11-11 DIAGNOSIS — M24542 Contracture, left hand: Secondary | ICD-10-CM | POA: Diagnosis not present

## 2022-11-11 DIAGNOSIS — R532 Functional quadriplegia: Secondary | ICD-10-CM | POA: Diagnosis not present

## 2022-11-11 DIAGNOSIS — Z7189 Other specified counseling: Secondary | ICD-10-CM | POA: Diagnosis not present

## 2022-11-13 DIAGNOSIS — R42 Dizziness and giddiness: Secondary | ICD-10-CM | POA: Diagnosis not present

## 2022-11-13 DIAGNOSIS — E440 Moderate protein-calorie malnutrition: Secondary | ICD-10-CM | POA: Diagnosis not present

## 2022-11-13 DIAGNOSIS — I10 Essential (primary) hypertension: Secondary | ICD-10-CM | POA: Diagnosis not present

## 2022-11-13 DIAGNOSIS — N184 Chronic kidney disease, stage 4 (severe): Secondary | ICD-10-CM | POA: Diagnosis not present

## 2022-11-13 DIAGNOSIS — Z0189 Encounter for other specified special examinations: Secondary | ICD-10-CM | POA: Diagnosis not present

## 2022-11-13 DIAGNOSIS — G1221 Amyotrophic lateral sclerosis: Secondary | ICD-10-CM | POA: Diagnosis not present

## 2022-11-15 DIAGNOSIS — G1221 Amyotrophic lateral sclerosis: Secondary | ICD-10-CM | POA: Diagnosis not present

## 2022-11-17 DIAGNOSIS — I110 Hypertensive heart disease with heart failure: Secondary | ICD-10-CM | POA: Diagnosis not present

## 2022-11-17 DIAGNOSIS — I69391 Dysphagia following cerebral infarction: Secondary | ICD-10-CM | POA: Diagnosis not present

## 2022-11-17 DIAGNOSIS — I129 Hypertensive chronic kidney disease with stage 1 through stage 4 chronic kidney disease, or unspecified chronic kidney disease: Secondary | ICD-10-CM | POA: Diagnosis not present

## 2022-11-17 DIAGNOSIS — D1803 Hemangioma of intra-abdominal structures: Secondary | ICD-10-CM | POA: Diagnosis not present

## 2022-11-17 DIAGNOSIS — B952 Enterococcus as the cause of diseases classified elsewhere: Secondary | ICD-10-CM | POA: Diagnosis not present

## 2022-11-17 DIAGNOSIS — K311 Adult hypertrophic pyloric stenosis: Secondary | ICD-10-CM | POA: Diagnosis not present

## 2022-11-17 DIAGNOSIS — I509 Heart failure, unspecified: Secondary | ICD-10-CM | POA: Diagnosis not present

## 2022-11-17 DIAGNOSIS — M1990 Unspecified osteoarthritis, unspecified site: Secondary | ICD-10-CM | POA: Diagnosis not present

## 2022-11-17 DIAGNOSIS — I700 Atherosclerosis of aorta: Secondary | ICD-10-CM | POA: Diagnosis not present

## 2022-11-17 DIAGNOSIS — I82402 Acute embolism and thrombosis of unspecified deep veins of left lower extremity: Secondary | ICD-10-CM | POA: Diagnosis not present

## 2022-11-17 DIAGNOSIS — I69354 Hemiplegia and hemiparesis following cerebral infarction affecting left non-dominant side: Secondary | ICD-10-CM | POA: Diagnosis not present

## 2022-11-17 DIAGNOSIS — R338 Other retention of urine: Secondary | ICD-10-CM | POA: Diagnosis not present

## 2022-11-17 DIAGNOSIS — N184 Chronic kidney disease, stage 4 (severe): Secondary | ICD-10-CM | POA: Diagnosis not present

## 2022-11-17 DIAGNOSIS — N39498 Other specified urinary incontinence: Secondary | ICD-10-CM | POA: Diagnosis not present

## 2022-11-17 DIAGNOSIS — R1312 Dysphagia, oropharyngeal phase: Secondary | ICD-10-CM | POA: Diagnosis not present

## 2022-11-17 DIAGNOSIS — I441 Atrioventricular block, second degree: Secondary | ICD-10-CM | POA: Diagnosis not present

## 2022-11-17 DIAGNOSIS — G40909 Epilepsy, unspecified, not intractable, without status epilepticus: Secondary | ICD-10-CM | POA: Diagnosis not present

## 2022-11-17 DIAGNOSIS — D631 Anemia in chronic kidney disease: Secondary | ICD-10-CM | POA: Diagnosis not present

## 2022-11-17 DIAGNOSIS — I2510 Atherosclerotic heart disease of native coronary artery without angina pectoris: Secondary | ICD-10-CM | POA: Diagnosis not present

## 2022-11-17 DIAGNOSIS — J4489 Other specified chronic obstructive pulmonary disease: Secondary | ICD-10-CM | POA: Diagnosis not present

## 2022-11-17 DIAGNOSIS — J9601 Acute respiratory failure with hypoxia: Secondary | ICD-10-CM | POA: Diagnosis not present

## 2022-11-17 DIAGNOSIS — G894 Chronic pain syndrome: Secondary | ICD-10-CM | POA: Diagnosis not present

## 2022-11-17 DIAGNOSIS — G1221 Amyotrophic lateral sclerosis: Secondary | ICD-10-CM | POA: Diagnosis not present

## 2022-11-17 DIAGNOSIS — K5900 Constipation, unspecified: Secondary | ICD-10-CM | POA: Diagnosis not present

## 2022-11-17 DIAGNOSIS — I82401 Acute embolism and thrombosis of unspecified deep veins of right lower extremity: Secondary | ICD-10-CM | POA: Diagnosis not present

## 2022-11-17 DIAGNOSIS — N401 Enlarged prostate with lower urinary tract symptoms: Secondary | ICD-10-CM | POA: Diagnosis not present

## 2022-11-17 DIAGNOSIS — E7800 Pure hypercholesterolemia, unspecified: Secondary | ICD-10-CM | POA: Diagnosis not present

## 2022-11-17 DIAGNOSIS — J432 Centrilobular emphysema: Secondary | ICD-10-CM | POA: Diagnosis not present

## 2022-11-17 DIAGNOSIS — E782 Mixed hyperlipidemia: Secondary | ICD-10-CM | POA: Diagnosis not present

## 2022-11-20 ENCOUNTER — Telehealth: Payer: Self-pay | Admitting: Home Health

## 2022-11-20 NOTE — Telephone Encounter (Signed)
Patient wife called, states she unplugged transmitter, it was blinking. She does not know Medtronic Rep number, Advised her to plug the transmitter back in. Medtronic number provided.

## 2022-11-22 DIAGNOSIS — G1221 Amyotrophic lateral sclerosis: Secondary | ICD-10-CM | POA: Diagnosis not present

## 2022-11-23 ENCOUNTER — Telehealth: Payer: Self-pay | Admitting: Family Medicine

## 2022-11-23 DIAGNOSIS — G1220 Motor neuron disease, unspecified: Secondary | ICD-10-CM | POA: Diagnosis not present

## 2022-11-23 DIAGNOSIS — L89892 Pressure ulcer of other site, stage 2: Secondary | ICD-10-CM | POA: Diagnosis not present

## 2022-11-23 DIAGNOSIS — R059 Cough, unspecified: Secondary | ICD-10-CM | POA: Diagnosis not present

## 2022-11-23 DIAGNOSIS — R197 Diarrhea, unspecified: Secondary | ICD-10-CM | POA: Diagnosis not present

## 2022-11-23 DIAGNOSIS — J3489 Other specified disorders of nose and nasal sinuses: Secondary | ICD-10-CM | POA: Diagnosis not present

## 2022-11-23 DIAGNOSIS — G1221 Amyotrophic lateral sclerosis: Secondary | ICD-10-CM | POA: Diagnosis not present

## 2022-11-23 NOTE — Telephone Encounter (Signed)
Contacted Darrell [name] to schedule their annual wellness visit. Appointment made for 11/30/22.  [name] AWV direct phone # [phone]

## 2022-11-24 ENCOUNTER — Emergency Department (HOSPITAL_COMMUNITY): Payer: Medicare HMO

## 2022-11-24 ENCOUNTER — Inpatient Hospital Stay (HOSPITAL_COMMUNITY)
Admission: EM | Admit: 2022-11-24 | Discharge: 2022-11-28 | DRG: 682 | Disposition: A | Discharge status: Home Health | Payer: Medicare HMO | Attending: Student | Admitting: Student

## 2022-11-24 DIAGNOSIS — K5641 Fecal impaction: Secondary | ICD-10-CM | POA: Diagnosis present

## 2022-11-24 DIAGNOSIS — R4701 Aphasia: Secondary | ICD-10-CM | POA: Diagnosis not present

## 2022-11-24 DIAGNOSIS — R8271 Bacteriuria: Secondary | ICD-10-CM | POA: Diagnosis present

## 2022-11-24 DIAGNOSIS — I129 Hypertensive chronic kidney disease with stage 1 through stage 4 chronic kidney disease, or unspecified chronic kidney disease: Secondary | ICD-10-CM | POA: Diagnosis present

## 2022-11-24 DIAGNOSIS — I441 Atrioventricular block, second degree: Secondary | ICD-10-CM | POA: Diagnosis present

## 2022-11-24 DIAGNOSIS — R54 Age-related physical debility: Secondary | ICD-10-CM | POA: Diagnosis present

## 2022-11-24 DIAGNOSIS — G1221 Amyotrophic lateral sclerosis: Secondary | ICD-10-CM | POA: Diagnosis present

## 2022-11-24 DIAGNOSIS — G9341 Metabolic encephalopathy: Secondary | ICD-10-CM | POA: Diagnosis not present

## 2022-11-24 DIAGNOSIS — Z9181 History of falling: Secondary | ICD-10-CM

## 2022-11-24 DIAGNOSIS — Z66 Do not resuscitate: Secondary | ICD-10-CM | POA: Diagnosis present

## 2022-11-24 DIAGNOSIS — R4182 Altered mental status, unspecified: Secondary | ICD-10-CM | POA: Diagnosis not present

## 2022-11-24 DIAGNOSIS — I10 Essential (primary) hypertension: Secondary | ICD-10-CM | POA: Diagnosis not present

## 2022-11-24 DIAGNOSIS — Z86718 Personal history of other venous thrombosis and embolism: Secondary | ICD-10-CM

## 2022-11-24 DIAGNOSIS — G9349 Other encephalopathy: Secondary | ICD-10-CM | POA: Diagnosis present

## 2022-11-24 DIAGNOSIS — N184 Chronic kidney disease, stage 4 (severe): Secondary | ICD-10-CM | POA: Diagnosis present

## 2022-11-24 DIAGNOSIS — N319 Neuromuscular dysfunction of bladder, unspecified: Secondary | ICD-10-CM | POA: Diagnosis present

## 2022-11-24 DIAGNOSIS — I69354 Hemiplegia and hemiparesis following cerebral infarction affecting left non-dominant side: Secondary | ICD-10-CM

## 2022-11-24 DIAGNOSIS — Z91013 Allergy to seafood: Secondary | ICD-10-CM

## 2022-11-24 DIAGNOSIS — Z978 Presence of other specified devices: Secondary | ICD-10-CM

## 2022-11-24 DIAGNOSIS — Z8249 Family history of ischemic heart disease and other diseases of the circulatory system: Secondary | ICD-10-CM

## 2022-11-24 DIAGNOSIS — Z833 Family history of diabetes mellitus: Secondary | ICD-10-CM

## 2022-11-24 DIAGNOSIS — Z881 Allergy status to other antibiotic agents status: Secondary | ICD-10-CM

## 2022-11-24 DIAGNOSIS — Z79899 Other long term (current) drug therapy: Secondary | ICD-10-CM

## 2022-11-24 DIAGNOSIS — G1223 Primary lateral sclerosis: Secondary | ICD-10-CM | POA: Diagnosis present

## 2022-11-24 DIAGNOSIS — Z7951 Long term (current) use of inhaled steroids: Secondary | ICD-10-CM

## 2022-11-24 DIAGNOSIS — B965 Pseudomonas (aeruginosa) (mallei) (pseudomallei) as the cause of diseases classified elsewhere: Secondary | ICD-10-CM | POA: Diagnosis present

## 2022-11-24 DIAGNOSIS — Z960 Presence of urogenital implants: Secondary | ICD-10-CM | POA: Diagnosis present

## 2022-11-24 DIAGNOSIS — Z8701 Personal history of pneumonia (recurrent): Secondary | ICD-10-CM

## 2022-11-24 DIAGNOSIS — Z825 Family history of asthma and other chronic lower respiratory diseases: Secondary | ICD-10-CM

## 2022-11-24 DIAGNOSIS — R8281 Pyuria: Secondary | ICD-10-CM | POA: Diagnosis present

## 2022-11-24 DIAGNOSIS — Z950 Presence of cardiac pacemaker: Secondary | ICD-10-CM | POA: Diagnosis present

## 2022-11-24 DIAGNOSIS — E875 Hyperkalemia: Secondary | ICD-10-CM | POA: Diagnosis present

## 2022-11-24 DIAGNOSIS — I6930 Unspecified sequelae of cerebral infarction: Secondary | ICD-10-CM

## 2022-11-24 DIAGNOSIS — D638 Anemia in other chronic diseases classified elsewhere: Secondary | ICD-10-CM | POA: Diagnosis present

## 2022-11-24 DIAGNOSIS — E860 Dehydration: Secondary | ICD-10-CM | POA: Diagnosis present

## 2022-11-24 DIAGNOSIS — D631 Anemia in chronic kidney disease: Secondary | ICD-10-CM | POA: Diagnosis present

## 2022-11-24 DIAGNOSIS — G40909 Epilepsy, unspecified, not intractable, without status epilepticus: Secondary | ICD-10-CM

## 2022-11-24 DIAGNOSIS — J45909 Unspecified asthma, uncomplicated: Secondary | ICD-10-CM | POA: Diagnosis present

## 2022-11-24 DIAGNOSIS — B952 Enterococcus as the cause of diseases classified elsewhere: Secondary | ICD-10-CM | POA: Diagnosis present

## 2022-11-24 DIAGNOSIS — R1310 Dysphagia, unspecified: Secondary | ICD-10-CM

## 2022-11-24 DIAGNOSIS — K219 Gastro-esophageal reflux disease without esophagitis: Secondary | ICD-10-CM | POA: Diagnosis present

## 2022-11-24 DIAGNOSIS — G6181 Chronic inflammatory demyelinating polyneuritis: Secondary | ICD-10-CM | POA: Diagnosis present

## 2022-11-24 DIAGNOSIS — R404 Transient alteration of awareness: Secondary | ICD-10-CM | POA: Diagnosis not present

## 2022-11-24 DIAGNOSIS — Z87891 Personal history of nicotine dependence: Secondary | ICD-10-CM

## 2022-11-24 DIAGNOSIS — I459 Conduction disorder, unspecified: Secondary | ICD-10-CM | POA: Diagnosis present

## 2022-11-24 DIAGNOSIS — Z743 Need for continuous supervision: Secondary | ICD-10-CM | POA: Diagnosis not present

## 2022-11-24 DIAGNOSIS — N179 Acute kidney failure, unspecified: Secondary | ICD-10-CM | POA: Diagnosis not present

## 2022-11-24 DIAGNOSIS — E7800 Pure hypercholesterolemia, unspecified: Secondary | ICD-10-CM | POA: Diagnosis present

## 2022-11-24 DIAGNOSIS — Z888 Allergy status to other drugs, medicaments and biological substances status: Secondary | ICD-10-CM

## 2022-11-24 DIAGNOSIS — H401134 Primary open-angle glaucoma, bilateral, indeterminate stage: Secondary | ICD-10-CM | POA: Diagnosis present

## 2022-11-24 NOTE — ED Provider Notes (Incomplete)
[facility] Provider Note  CSN: [id number] Arrival date & time: 11/24/22 2241  Chief Complaint(s) Aphasia and Altered Mental Status  HPI Darrell [name] is a 72 y.o. male {Add pertinent medical, surgical, social history, OB history to HPI:1}    Altered Mental Status   Past Medical History Past Medical History:  Diagnosis Date  . Arthritis   . Asthma   . At high risk for falls 08/16/2015  . CIDP (chronic inflammatory demyelinating polyneuropathy) ([place])   . CKD (chronic kidney disease) stage 3, GFR 30-59 ml/min ([name]) 08/16/2015  . Dysphagia as late effect of cerebrovascular disease    pts wife states pt has to eat soft foods   . Elevated liver enzymes 08/10/2016  . GERD (gastroesophageal reflux disease)   . Glaucoma   . High cholesterol   . History of CVA with residual deficit 03/25/2013  . Hypertension   . Hypertensive retinopathy of both eyes 01/16/2017  . Inguinal hernia 03/25/2013  . Liver hemangioma 08/14/2016  . New onset seizure ([place]) 07/08/2017   seizure 07/14/18  . Nuclear sclerosis of both eyes 10/25/2016  . Pneumonia   . Presence of permanent cardiac pacemaker   . Primary open angle glaucoma of both eyes, indeterminate stage 10/25/2016  . Renal mass, right 08/14/2016  . Status cardiac pacemaker 01/29/2017   Placed for second degree heart block on 01/16/17 Medtronic Azure XT DR MRI SureScan dual-chamber pacemaker  . Stroke [facility])    2011 with residual deficit left sided weakness  . Tobacco dependence    Patient Active Problem List   Diagnosis Date Noted  . Pressure injury of skin 09/08/2022  . Chronic indwelling Foley catheter 08/11/2022  . Diarrhea 08/11/2022  . Chronic anticoagulation 08/11/2022  . Aspiration pneumonia ([place]) 07/06/2022  . Urinary retention 07/06/2022  . Catheter-associated urinary tract infection ([place]) 07/06/2022  . Chronic deep vein thrombosis (DVT) ([place]) 05/17/2022  . PNA (pneumonia)  05/17/2022  . DNR (do not resuscitate) 04/13/2022  . Community acquired bacterial pneumonia 03/23/2022  . Acute respiratory failure with hypoxia ([place]) 10/10/2021  . Coffee ground emesis 10/07/2021  . Hyperkalemia 10/07/2021  . Normocytic anemia 10/07/2021  . Bigeminal rhythm 10/07/2021  . Fecal impaction ([place])   . Centrilobular emphysema ([place]) 05/23/2021  . Acute metabolic encephalopathy [id number]  . COVID-19 virus infection 05/06/2021  . Anemia, chronic disease 05/06/2021  . Benign prostatic hyperplasia 05/06/2021  . Ulcer of ankle ([place]) 03/29/2021  . CKD (chronic kidney disease), stage IV ([place]) 03/09/2021  . Cognitive communication deficit 01/10/2021  . Chronic bronchitis ([place]) 05/25/2020  . Chronic cough 05/25/2020  . Goals of care, counseling/discussion   . Palliative care by specialist   . Acute kidney injury superimposed on chronic kidney disease ([place]) 05/05/2019  . Pain due to onychomycosis of toenails of both feet 03/25/2019  . 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 Dysarthria 05/21/2018  . Dysphagia 05/21/2018  . Gait abnormality 05/21/2018  . Spasticity 05/21/2018  . GERD (gastroesophageal reflux disease) 07/21/2017  . Mixed hyperlipidemia 07/21/2017  . Bradycardia   . Seizure disorder ([place]) 07/08/2017  . Status cardiac pacemaker 01/29/2017  . Unintended weight loss 01/29/2017  . Tinea pedis of right foot 01/29/2017  . Hypertensive retinopathy of both eyes 01/16/2017  . Hypercalcemia 01/15/2017  . Heart block 01/15/2017  . Nuclear sclerosis of both eyes 10/25/2016  . Primary open angle glaucoma of both eyes, indeterminate stage 10/25/2016  .  Glaucoma 09/12/2016  . Liver hemangioma 08/14/2016  . Renal cyst 08/14/2016  . Renal mass, right 08/14/2016  . Elevated liver enzymes 08/10/2016  . Chronic kidney disease, stage 3b ([place]) 08/16/2015  . At high risk for falls 08/16/2015  . Subcutaneous nodules  08/16/2015  . Prediabetes 10/07/2013  . Inguinal hernia 03/25/2013  . History of CVA with residual deficit 03/25/2013  . Essential hypertension 10/09/2012  . Tobacco abuse 10/09/2012   Home Medication(s) Prior to Admission medications   Medication Sig Start Date End Date Taking? Authorizing Provider  albuterol (VENTOLIN HFA) 108 (90 Base) MCG/ACT inhaler Inhale 2 puffs into the lungs every 6 (six) hours as needed for up to 30 days for Wheezing. 06/08/21   Argentina Donovan, PA-C  amLODipine (NORVASC) 10 MG tablet Take 1 tablet (10 mg total) by mouth daily. 06/07/22   [name], MD  atorvastatin (LIPITOR) 40 MG tablet Take 1 tablet (40 mg total) by mouth daily. 06/07/22   [name], MD  azithromycin (ZITHROMAX) 250 MG tablet Take 1 tablet (250 mg total) by mouth 3 (three) times a week. Take on Mon, Wed, Fri 10/04/22   Sherrilyn Rist A, MD  brimonidine (ALPHAGAN) 0.2 % ophthalmic solution Place 1 drop into both eyes daily. 07/25/21   [provider]  cetirizine (ZYRTEC) 10 MG tablet Take 10 mg by mouth daily.    [provider]  dicyclomine (BENTYL) 10 MG capsule TAKE 1 CAPSULE BY MOUTH ONCE DAILY AS NEEDED Patient taking differently: Take 10 mg by mouth daily as needed for spasms. 08/30/22   [name], MD  Docusate Sodium 100 MG capsule Take 100 mg by mouth. 09/21/22   [provider]  dorzolamide-timolol (COSOPT) 22.3-6.8 MG/ML ophthalmic solution Place 1 drop into both eyes daily.    [provider]  finasteride (PROSCAR) 5 MG tablet Take 5 mg by mouth daily. 12/13/21   [provider]  fluticasone furoate-vilanterol (BREO ELLIPTA) 200-25 MCG/ACT AEPB Inhale 2 puffs into the lungs daily as needed (for shortness of breath).    [provider]  glycopyrrolate (ROBINUL) 1 MG tablet Take 1 mg by mouth 2 (two) times daily. 10/21/22   [provider]  hydrALAZINE (APRESOLINE) 25 MG tablet Take 1 tablet (25 mg total) by  mouth 3 (three) times daily. 04/25/22   Shirley Friar, PA-C  latanoprost (XALATAN) 0.005 % ophthalmic solution Place 1 drop into both eyes at bedtime. 07/25/21   [provider]  levETIRAcetam (KEPPRA) 500 MG tablet Take 1 tablet (500 mg total) by mouth 2 (two) times daily. 12/19/21   Penumalli, Earlean Polka, MD  metoCLOPramide (REGLAN) 5 MG tablet Take 1 tablet (5 mg total) by mouth 3 (three) times daily before meals. 09/16/22 10/16/22  Pahwani, Michell Heinrich, MD  metoprolol succinate (TOPROL-XL) 50 MG 24 hr tablet Take 1 tablet (50 mg total) by mouth at bedtime. Take with or immediately following a meal. 11/06/22   Camnitz, Ocie Doyne, MD  montelukast (SINGULAIR) 10 MG tablet Take 1 tablet by mouth at bedtime. 06/19/22   [provider]  Multiple Vitamin (MULTI VITAMIN) TABS 1 tablet Orally Once a day    [provider]  pantoprazole (PROTONIX) 40 MG tablet Take 1 tablet (40 mg total) by mouth 2 (two) times daily. 09/16/22 10/16/22  Pahwani, Michell Heinrich, MD  polyethylene glycol powder (MIRALAX) 17 GM/SCOOP powder 1 scoop mixed with 8 ounces of fluid Orally Once a day 09/21/22   [provider]  tamsulosin [facility])  0.4 MG CAPS capsule Take 1 capsule (0.4 mg total) by mouth daily. 01/24/22   [name], MD  torsemide (DEMADEX) 20 MG tablet Take 20 mg by mouth daily as needed (fluid).    [provider]                                                                                                                                    Allergies Ace inhibitors, Doxycycline, Atacand hct [candesartan cilexetil-hctz], and Shellfish allergy  Review of Systems Review of Systems As noted in HPI  Physical Exam Vital Signs  I have reviewed the triage vital signs BP (!) 140/86 (BP Location: Right Arm)   Pulse 68   Temp 97.7 F (36.5 C) (Rectal)   Resp 13   SpO2 97%  *** Physical Exam  ED Results and Treatments Labs (all labs ordered are listed, but only  abnormal results are displayed) Labs Reviewed - No data to display                                                                                                                       EKG  EKG Interpretation  Date/Time:    Ventricular Rate:    PR Interval:    QRS Duration:   QT Interval:    QTC Calculation:   R Axis:     Text Interpretation:         Radiology No results found.  Medications Ordered in ED Medications - No data to display                                                                                                                                   Procedures Procedures  (including critical care time)  Medical Decision Making / ED Course  Click here for ABCD2, HEART and other calculators  Medical Decision Making  Final Clinical Impression(s) / ED Diagnoses Final diagnoses:  None    {Document critical care time when appropriate:1}  {Document review of labs and clinical decision tools ie heart score, Chads2Vasc2 etc:1}  {Document your independent review of radiology images, and any outside records:1} {Document your discussion with family members, caretakers, and with consultants:1} {Document social determinants of health affecting pt's care:1} {Document your decision making why or why not admission, treatments were needed:1} This chart was dictated using voice recognition software.  Despite best efforts to proofread,  errors can occur which can change the documentation meaning.

## 2022-11-24 NOTE — ED Provider Notes (Signed)
[facility] Provider Note  CSN: [id number] Arrival date & time: 11/24/22 2241  Chief Complaint(s) Aphasia and Altered Mental Status ED Triage Notes Darrell Neer, RN (Registered Nurse)  Emergency Medicine  Date of Service: 11/24/2022 10:42 PM  Signed Patient arrives with [name] from home C/O decreased level of consciousness and aphasia. LKW 1000 today. Pt has hx ALS and stroke, per ems family states that patient will normally talk and stay awake but has not since this morning. Family also informs EMS that when this has happen in the past it has been pneumonia. Pt arrives sleeping with indwelling catheter in place.     HPI Darrell [name] is a 72 y.o. male with extensive past medical history listed below who presents to the emergency department with somnolence.  Patient is sleepy and will answer yes or no to questions.  Denies any current pain.  The history is provided by the EMS personnel.    Past Medical History Past Medical History:  Diagnosis Date   Arthritis    Asthma    At high risk for falls 08/16/2015   CIDP (chronic inflammatory demyelinating polyneuropathy) ([name])    CKD (chronic kidney disease) stage 3, GFR 30-59 ml/min ([name]) 08/16/2015   Dysphagia as late effect of cerebrovascular disease    pts wife states pt has to eat soft foods    Elevated liver enzymes 08/10/2016   GERD (gastroesophageal reflux disease)    Glaucoma    High cholesterol    History of CVA with residual deficit 03/25/2013   Hypertension    Hypertensive retinopathy of both eyes 01/16/2017   Inguinal hernia 03/25/2013   Liver hemangioma 08/14/2016   New onset seizure ([place]) 07/08/2017   seizure 07/14/18   Nuclear sclerosis of both eyes 10/25/2016   Pneumonia    Presence of permanent cardiac pacemaker    Primary open angle glaucoma of both eyes, indeterminate stage 10/25/2016   Renal mass, right 08/14/2016   Status cardiac pacemaker 01/29/2017   Placed for second degree  heart block on 01/16/17 Medtronic Azure XT DR MRI SureScan dual-chamber pacemaker   Stroke [facility])    2011 with residual deficit left sided weakness   Tobacco dependence    Patient Active Problem List   Diagnosis Date Noted   Pressure injury of skin 09/08/2022   Chronic indwelling Foley catheter 08/11/2022   Chronic anticoagulation 08/11/2022   Urinary retention 07/06/2022   Catheter-associated urinary tract infection ([place]) 07/06/2022   Chronic deep vein thrombosis (DVT) ([place]) 05/17/2022   DNR (do not resuscitate) 04/13/2022   Normocytic anemia 10/07/2021   Bigeminal rhythm 10/07/2021   Fecal impaction ([place])    Centrilobular emphysema ([place]) 05/23/2021   Anemia, chronic disease 05/06/2021   Benign prostatic hyperplasia 05/06/2021   Ulcer of ankle ([place]) 03/29/2021   CKD (chronic kidney disease), stage IV ([name]) - baseline SCr 2.2-2.4 03/09/2021   Cognitive communication deficit 01/10/2021   Chronic bronchitis ([place]) 05/25/2020   Chronic cough 05/25/2020   Goals of care, counseling/discussion    Palliative care by specialist    Acute renal failure superimposed on stage 4 chronic kidney disease ([place]) 05/05/2019   Pain due to onychomycosis of toenails of both feet 03/25/2019   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   ALS (amyotrophic lateral sclerosis) ([place]) 07/24/2018   Dysarthria 05/21/2018   Dysphagia - pureed diet, honey-thick liquids 05/21/2018   Gait abnormality 05/21/2018   Spasticity 05/21/2018   GERD (gastroesophageal reflux disease) 07/21/2017   Mixed  hyperlipidemia 07/21/2017   Seizure disorder ([place]) 07/08/2017   Status cardiac pacemaker 01/29/2017   Unintended weight loss 01/29/2017   Tinea pedis of right foot 01/29/2017   Hypertensive retinopathy of both eyes 01/16/2017   Hypercalcemia 01/15/2017   Heart block 01/15/2017   Nuclear sclerosis of both eyes 10/25/2016   Primary open angle glaucoma of both eyes, indeterminate stage 10/25/2016   Glaucoma 09/12/2016    Liver hemangioma 08/14/2016   Renal cyst 08/14/2016   Renal mass, right 08/14/2016   Elevated liver enzymes 08/10/2016   CKD (chronic kidney disease) stage 4, GFR 15-29 ml/min ([place]) 08/16/2015   At high risk for falls 08/16/2015   Subcutaneous nodules 08/16/2015   Prediabetes 10/07/2013   Inguinal hernia 03/25/2013   History of CVA with residual deficit 03/25/2013   Essential hypertension 10/09/2012   Tobacco abuse 10/09/2012   Home Medication(s) Prior to Admission medications   Medication Sig Start Date End Date Taking? Authorizing Provider  albuterol (VENTOLIN HFA) 108 (90 Base) MCG/ACT inhaler Inhale 2 puffs into the lungs every 6 (six) hours as needed for up to 30 days for Wheezing. 06/08/21  Yes Freeman Caldron M, PA-C  amLODipine (NORVASC) 10 MG tablet Take 1 tablet (10 mg total) by mouth daily. 06/07/22  Yes [name], MD  atorvastatin (LIPITOR) 40 MG tablet Take 1 tablet (40 mg total) by mouth daily. 06/07/22  Yes [name], MD  azithromycin (ZITHROMAX) 250 MG tablet Take 1 tablet (250 mg total) by mouth 3 (three) times a week. Take on Mon, Wed, Fri 10/04/22  Yes Olalere, Adewale A, MD  brimonidine (ALPHAGAN) 0.2 % ophthalmic solution Place 1 drop into both eyes daily. 07/25/21  Yes [provider]  cetirizine (ZYRTEC) 10 MG tablet Take 10 mg by mouth daily.   Yes [provider]  dicyclomine (BENTYL) 10 MG capsule TAKE 1 CAPSULE BY MOUTH ONCE DAILY AS NEEDED Patient taking differently: Take 10 mg by mouth daily as needed for spasms. 08/30/22  Yes [name], MD  Docusate Sodium 100 MG capsule Take 100 mg by mouth See admin instructions. Take 1 capsule by mouth in the morning and at night every other day 09/21/22  Yes [provider]  dorzolamide-timolol (COSOPT) 22.3-6.8 MG/ML ophthalmic solution Place 1 drop into both eyes daily.   Yes [provider]  finasteride (PROSCAR) 5 MG tablet Take 5 mg by mouth daily. 12/13/21  Yes [provider]  fluticasone furoate-vilanterol (BREO ELLIPTA) 200-25 MCG/ACT AEPB Inhale 2 puffs into the lungs daily as needed (for shortness of breath).   Yes [provider]  glycopyrrolate (ROBINUL) 1 MG tablet Take 1 mg by mouth 2 (two) times daily. 10/21/22  Yes [provider]  hydrALAZINE (APRESOLINE) 25 MG tablet Take 1 tablet (25 mg total) by mouth 3 (three) times daily. 04/25/22  Yes Shirley Friar, PA-C  latanoprost (XALATAN) 0.005 % ophthalmic solution Place 1 drop into both eyes at bedtime. 07/25/21  Yes [provider]  levETIRAcetam (KEPPRA) 500 MG tablet Take 1 tablet (500 mg total) by mouth 2 (two) times daily. 12/19/21  Yes Penumalli, Earlean Polka, MD  magnesium citrate SOLN Take 1 Bottle by mouth daily as needed for moderate constipation.   Yes [provider]  metoCLOPramide (REGLAN) 5 MG tablet Take 1 tablet (5 mg total) by mouth 3 (three) times daily before meals. Patient taking differently: Take 5 mg by mouth daily as needed for nausea. 09/16/22 11/25/23 Yes Pahwani, Michell Heinrich, MD  metoprolol  succinate (TOPROL-XL) 50 MG 24 hr tablet Take 1 tablet (50 mg total) by mouth at bedtime. Take with or immediately following a meal. 11/06/22  Yes Camnitz, Will Hassell Done, MD  montelukast (SINGULAIR) 10 MG tablet Take 1 tablet by mouth at bedtime. 06/19/22  Yes [provider]  Multiple Vitamin (MULTI VITAMIN) TABS Take 1 tablet by mouth daily.   Yes [provider]  pantoprazole (PROTONIX) 40 MG tablet Take 1 tablet (40 mg total) by mouth 2 (two) times daily. Patient taking differently: Take 40 mg by mouth daily. 09/16/22 11/25/23 Yes Pahwani, Rinka R, MD  tamsulosin (FLOMAX) 0.4 MG CAPS capsule Take 1 capsule (0.4 mg total) by mouth daily. 01/24/22  Yes [name], MD  torsemide (DEMADEX) 20 MG tablet Take 20 mg by mouth daily as needed (fluid).   Yes [provider]  polyethylene glycol powder (MIRALAX) 17 GM/SCOOP powder 1 scoop  mixed with 8 ounces of fluid Orally Once a day Patient not taking: Reported on 11/25/2022 09/21/22   [provider]                                                                                                                                    Allergies Ace inhibitors, Doxycycline, Atacand hct [candesartan cilexetil-hctz], and Shellfish allergy  Review of Systems Review of Systems As noted in HPI  Physical Exam Vital Signs  I have reviewed the triage vital signs BP (!) 143/84 (BP Location: Right Arm)   Pulse 64   Temp 97.6 F (36.4 C) (Oral)   Resp 12   SpO2 100%   Physical Exam Vitals reviewed.  Constitutional:      General: He is not in acute distress.    Appearance: He is well-developed. He is not diaphoretic.  HENT:     Head: Normocephalic and atraumatic.     Nose: Nose normal.  Eyes:     General: No scleral icterus.       Right eye: No discharge.        Left eye: No discharge.     Conjunctiva/sclera: Conjunctivae normal.     Pupils: Pupils are equal, round, and reactive to light.  Cardiovascular:     Rate and Rhythm: Normal rate and regular rhythm.     Heart sounds: No murmur heard.    No friction rub. No gallop.  Pulmonary:     Effort: Pulmonary effort is normal. No respiratory distress.     Breath sounds: Normal breath sounds. No stridor. No rales.  Abdominal:     General: There is no distension.     Palpations: Abdomen is soft.     Tenderness: There is no abdominal tenderness.  Genitourinary:    Comments: Indwelling foley Musculoskeletal:        General: No tenderness.     Cervical back: Normal range of motion and neck supple.  Skin:    General: Skin is warm and dry.  Findings: No erythema or rash.  Neurological:     Mental Status: He is lethargic.     Comments: Extremity contractions     ED Results and Treatments Labs (all labs ordered are listed, but only abnormal results are displayed) Labs Reviewed  COMPREHENSIVE METABOLIC PANEL  - Abnormal; Notable for the following components:      Result Value   Potassium 5.2 (*)    CO2 20 (*)    Glucose, Bld 120 (*)    BUN 101 (*)    Creatinine, Ser 3.54 (*)    Total Protein 6.4 (*)    Albumin 3.2 (*)    AST 14 (*)    GFR, Estimated 18 (*)    All other components within normal limits  CBC WITH DIFFERENTIAL/PLATELET - Abnormal; Notable for the following components:   RBC 3.31 (*)    Hemoglobin 9.9 (*)    HCT 30.9 (*)    Eosinophils Absolute 0.7 (*)    All other components within normal limits  URINALYSIS, ROUTINE W REFLEX MICROSCOPIC - Abnormal; Notable for the following components:   APPearance HAZY (*)    Leukocytes,Ua LARGE (*)    Bacteria, UA RARE (*)    All other components within normal limits  URINE CULTURE  LACTIC ACID, PLASMA  PROTIME-INR  APTT                                                                                                                         EKG  EKG Interpretation  Date/Time:  Friday November 24 2022 23:32:34 EST Ventricular Rate:  72 PR Interval:  244 QRS Duration: 179 QT Interval:  477 QTC Calculation: 523 R Axis:   -65 Text Interpretation: ATRIAL SENSING AND PACING Premature ventricular complexes Prolonged PR interval Left bundle branch block Confirmed by Addison Lank [phone]) on 11/25/2022 12:49:20 AM       Radiology CT Renal Stone Study  Result Date: 11/25/2022 CLINICAL DATA:  Acute renal injury, possible obstruction EXAM: CT ABDOMEN AND PELVIS WITHOUT CONTRAST TECHNIQUE: Multidetector CT imaging of the abdomen and pelvis was performed following the standard protocol without IV contrast. RADIATION DOSE REDUCTION: This exam was performed according to the departmental dose-optimization program which includes automated exposure control, adjustment of the mA and/or kV according to patient size and/or use of iterative reconstruction technique. COMPARISON:  09/11/2022 FINDINGS: Lower chest: Lung bases are free of acute infiltrate or  sizable effusion. Hepatobiliary: No focal liver abnormality is seen. No gallstones, gallbladder wall thickening, or biliary dilatation. Pancreas: Unremarkable. No pancreatic ductal dilatation or surrounding inflammatory changes. Spleen: Normal in size without focal abnormality. Adrenals/Urinary Tract: Adrenal glands are within normal limits. Kidneys demonstrate multiple cystic lesions bilaterally some of which demonstrate calcifications within. These are stable in appearance from the prior exam. No further follow-up is recommended. No obstructive changes are noted. The bladder is decompressed by Foley catheter. Stomach/Bowel: Some retained fecal material is noted within the rectum which may represent some early impaction. Mild  wall thickening is noted similar to that seen on prior exam suspicious again for stercoral colitis. No obstructive changes are seen. The appendix is within normal limits. Small bowel and stomach are unremarkable. Vascular/Lymphatic: Atherosclerotic calcifications are noted without aneurysmal dilatation. No significant lymphadenopathy is seen. Reproductive: Prostate is unremarkable. Other: No abdominal wall hernia or abnormality. No abdominopelvic ascites. Musculoskeletal: Degenerative changes of lumbar spine are noted. IMPRESSION: No evidence of urinary tract obstructive changes. Changes suspicious for stercoral colitis the rectum. These findings are stable from the prior exam. Remainder of the study is stable from the previous exam. Electronically Signed   By: Inez Catalina M.D.   On: 11/25/2022 01:22   DG Chest Port 1 View  Result Date: 11/25/2022 CLINICAL DATA:  Decreased level of consciousness, aphasia EXAM: PORTABLE CHEST 1 VIEW COMPARISON:  09/06/2022 FINDINGS: Lungs are clear.  No pleural effusion or pneumothorax. The heart is normal in size.  Left subclavian pacemaker. IMPRESSION: No evidence of acute cardiopulmonary disease. Electronically Signed   By: Julian Hy M.D.   On:  11/25/2022 00:08    Medications Ordered in ED Medications  0.45 % sodium chloride infusion ( Intravenous New Bag/Given 11/25/22 0332)  azithromycin (ZITHROMAX) tablet 250 mg (has no administration in time range)  amLODipine (NORVASC) tablet 10 mg (has no administration in time range)  atorvastatin (LIPITOR) tablet 40 mg (has no administration in time range)  hydrALAZINE (APRESOLINE) tablet 25 mg (has no administration in time range)  metoprolol succinate (TOPROL-XL) 24 hr tablet 50 mg (has no administration in time range)  pantoprazole (PROTONIX) EC tablet 40 mg (has no administration in time range)  levETIRAcetam (KEPPRA) tablet 500 mg (has no administration in time range)  montelukast (SINGULAIR) tablet 10 mg (has no administration in time range)  brimonidine (ALPHAGAN) 0.2 % ophthalmic solution 1 drop (has no administration in time range)  dorzolamide-timolol (COSOPT) 2-0.5 % ophthalmic solution 1 drop (has no administration in time range)  latanoprost (XALATAN) 0.005 % ophthalmic solution 1 drop (has no administration in time range)  polyethylene glycol (MIRALAX / GLYCOLAX) packet 34 g (has no administration in time range)  heparin injection 5,000 Units (5,000 Units Subcutaneous Given 11/25/22 0624)  acetaminophen (TYLENOL) tablet 650 mg (has no administration in time range)    Or  acetaminophen (TYLENOL) suppository 650 mg (has no administration in time range)  ondansetron (ZOFRAN) tablet 4 mg (has no administration in time range)    Or  ondansetron (ZOFRAN) injection 4 mg (has no administration in time range)  sodium chloride 0.9 % bolus 1,000 mL (0 mLs Intravenous Stopped 11/25/22 0430)  cefTRIAXone (ROCEPHIN) 1 g in sodium chloride 0.9 % 100 mL IVPB (0 g Intravenous Stopped 11/25/22 0212)                                                                                                                                     Procedures Procedures  (including  critical care time)  Medical  Decision Making / ED Course  Click here for ABCD2, HEART and other calculators  Medical Decision Making Amount and/or Complexity of Data Reviewed Labs: ordered. Radiology: ordered. ECG/medicine tests: ordered.  Risk Decision regarding hospitalization.     Complexity of Problem:  Patient's presenting problem/concern, DDX, and MDM listed below: This patient presents to the ED for concern of somnolence, this involves an extensive number of treatment options, and is a complaint that carries with it a high risk of complications and morbidity. The differential diagnosis includes but not limited to: Electrolyte/metabolic derangements Polypharmacy Infection  Hospitalization Considered:  yes  Initial Intervention:  IVF    Complexity of Data:   Cardiac Monitoring/EKG: EKG with atrial sensing ventricular pacing  Laboratory Tests ordered listed below with my independent interpretation: CBC without leukocytosis.  Mild anemia with stable hemoglobin. CMP with mild hypokalemia with evidence of AKI and elevated BUN. Lactic acid normal  UA questionable for infection    Imaging Studies ordered listed below with my independent interpretation: Chest x-ray without pneumonia, pleural effusions or pulmonary edema CT renal stone study negative for any evidence of postrenal obstruction.  It did reveal fecal impaction with stable stercoral colitis without perforation.     ED Course:    Assessment, Add'l Intervention, and Reassessment: Somnolence Given AKI and elevated BUN likely dehydration and mild uremia UA does show evidence of infection and patient was started on empiric antibiotics.  Will need to culture Patient admitted to medicine for further workup and management.         Final Clinical Impression(s) / ED Diagnoses Final diagnoses:  AKI (acute kidney injury) ([place])           This chart was dictated using voice recognition software.  Despite best efforts to proofread,   errors can occur which can change the documentation meaning.    Fatima Blank, MD 11/25/22 [phone]

## 2022-11-24 NOTE — ED Triage Notes (Signed)
Patient arrives with [facility] from home C/O decreased level of consciousness and aphasia. LKW 1000 today. Pt has hx ALS and stroke, per ems family states that patient will normally talk and stay awake but has not since this morning. Family also informs EMS that when this has happen in the past it has been pneumonia. Pt arrives sleeping with indwelling catheter in place.

## 2022-11-25 ENCOUNTER — Other Ambulatory Visit: Payer: Self-pay | Admitting: Family Medicine

## 2022-11-25 ENCOUNTER — Emergency Department (HOSPITAL_COMMUNITY): Payer: Medicare HMO

## 2022-11-25 DIAGNOSIS — J45909 Unspecified asthma, uncomplicated: Secondary | ICD-10-CM | POA: Diagnosis not present

## 2022-11-25 DIAGNOSIS — Z978 Presence of other specified devices: Secondary | ICD-10-CM | POA: Diagnosis not present

## 2022-11-25 DIAGNOSIS — E7800 Pure hypercholesterolemia, unspecified: Secondary | ICD-10-CM | POA: Diagnosis not present

## 2022-11-25 DIAGNOSIS — E875 Hyperkalemia: Secondary | ICD-10-CM | POA: Diagnosis not present

## 2022-11-25 DIAGNOSIS — N184 Chronic kidney disease, stage 4 (severe): Secondary | ICD-10-CM

## 2022-11-25 DIAGNOSIS — R54 Age-related physical debility: Secondary | ICD-10-CM | POA: Diagnosis not present

## 2022-11-25 DIAGNOSIS — I69354 Hemiplegia and hemiparesis following cerebral infarction affecting left non-dominant side: Secondary | ICD-10-CM | POA: Diagnosis not present

## 2022-11-25 DIAGNOSIS — N179 Acute kidney failure, unspecified: Secondary | ICD-10-CM | POA: Diagnosis not present

## 2022-11-25 DIAGNOSIS — I441 Atrioventricular block, second degree: Secondary | ICD-10-CM | POA: Diagnosis not present

## 2022-11-25 DIAGNOSIS — G40909 Epilepsy, unspecified, not intractable, without status epilepticus: Secondary | ICD-10-CM | POA: Diagnosis not present

## 2022-11-25 DIAGNOSIS — G9349 Other encephalopathy: Secondary | ICD-10-CM | POA: Diagnosis not present

## 2022-11-25 DIAGNOSIS — I10 Essential (primary) hypertension: Secondary | ICD-10-CM | POA: Diagnosis not present

## 2022-11-25 DIAGNOSIS — K5641 Fecal impaction: Secondary | ICD-10-CM | POA: Diagnosis not present

## 2022-11-25 DIAGNOSIS — D631 Anemia in chronic kidney disease: Secondary | ICD-10-CM | POA: Diagnosis not present

## 2022-11-25 DIAGNOSIS — G6181 Chronic inflammatory demyelinating polyneuritis: Secondary | ICD-10-CM | POA: Diagnosis not present

## 2022-11-25 DIAGNOSIS — Z950 Presence of cardiac pacemaker: Secondary | ICD-10-CM

## 2022-11-25 DIAGNOSIS — R8279 Other abnormal findings on microbiological examination of urine: Secondary | ICD-10-CM | POA: Diagnosis not present

## 2022-11-25 DIAGNOSIS — I459 Conduction disorder, unspecified: Secondary | ICD-10-CM

## 2022-11-25 DIAGNOSIS — Z743 Need for continuous supervision: Secondary | ICD-10-CM | POA: Diagnosis not present

## 2022-11-25 DIAGNOSIS — I6930 Unspecified sequelae of cerebral infarction: Secondary | ICD-10-CM | POA: Diagnosis not present

## 2022-11-25 DIAGNOSIS — G1221 Amyotrophic lateral sclerosis: Secondary | ICD-10-CM | POA: Diagnosis not present

## 2022-11-25 DIAGNOSIS — I129 Hypertensive chronic kidney disease with stage 1 through stage 4 chronic kidney disease, or unspecified chronic kidney disease: Secondary | ICD-10-CM | POA: Diagnosis not present

## 2022-11-25 DIAGNOSIS — D638 Anemia in other chronic diseases classified elsewhere: Secondary | ICD-10-CM

## 2022-11-25 DIAGNOSIS — Z66 Do not resuscitate: Secondary | ICD-10-CM | POA: Diagnosis not present

## 2022-11-25 DIAGNOSIS — G1223 Primary lateral sclerosis: Secondary | ICD-10-CM | POA: Diagnosis not present

## 2022-11-25 DIAGNOSIS — G9341 Metabolic encephalopathy: Secondary | ICD-10-CM | POA: Diagnosis not present

## 2022-11-25 DIAGNOSIS — R4701 Aphasia: Secondary | ICD-10-CM | POA: Diagnosis not present

## 2022-11-25 DIAGNOSIS — H401134 Primary open-angle glaucoma, bilateral, indeterminate stage: Secondary | ICD-10-CM | POA: Diagnosis not present

## 2022-11-25 DIAGNOSIS — R8271 Bacteriuria: Secondary | ICD-10-CM | POA: Diagnosis not present

## 2022-11-25 DIAGNOSIS — N319 Neuromuscular dysfunction of bladder, unspecified: Secondary | ICD-10-CM | POA: Diagnosis not present

## 2022-11-25 DIAGNOSIS — R1310 Dysphagia, unspecified: Secondary | ICD-10-CM

## 2022-11-25 DIAGNOSIS — Z7401 Bed confinement status: Secondary | ICD-10-CM | POA: Diagnosis not present

## 2022-11-25 DIAGNOSIS — N3289 Other specified disorders of bladder: Secondary | ICD-10-CM | POA: Diagnosis not present

## 2022-11-25 DIAGNOSIS — R531 Weakness: Secondary | ICD-10-CM | POA: Diagnosis not present

## 2022-11-25 DIAGNOSIS — E860 Dehydration: Secondary | ICD-10-CM | POA: Diagnosis not present

## 2022-11-25 LAB — COMPREHENSIVE METABOLIC PANEL
ALT: 10 U/L (ref 0–44)
AST: 14 U/L — ABNORMAL LOW (ref 15–41)
Albumin: 3.2 g/dL — ABNORMAL LOW (ref 3.5–5.0)
Alkaline Phosphatase: 79 U/L (ref 38–126)
Anion gap: 8 (ref 5–15)
BUN: 101 mg/dL — ABNORMAL HIGH (ref 8–23)
CO2: 20 mmol/L — ABNORMAL LOW (ref 22–32)
Calcium: 9.8 mg/dL (ref 8.9–10.3)
Chloride: 108 mmol/L (ref 98–111)
Creatinine, Ser: 3.54 mg/dL — ABNORMAL HIGH (ref 0.61–1.24)
GFR, Estimated: 18 mL/min — ABNORMAL LOW (ref >60)
Glucose, Bld: 120 mg/dL — ABNORMAL HIGH (ref 70–99)
Potassium: 5.2 mmol/L — ABNORMAL HIGH (ref 3.5–5.1)
Sodium: 136 mmol/L (ref 135–145)
Total Bilirubin: 0.6 mg/dL (ref 0.3–1.2)
Total Protein: 6.4 g/dL — ABNORMAL LOW (ref 6.5–8.1)

## 2022-11-25 LAB — CBC
HCT: 29.3 % — ABNORMAL LOW (ref 39.0–52.0)
Hemoglobin: 9.1 g/dL — ABNORMAL LOW (ref 13.0–17.0)
MCH: 28.9 pg (ref 26.0–34.0)
MCHC: 31.1 g/dL (ref 30.0–36.0)
MCV: 93 fL (ref 80.0–100.0)
Platelets: 190 10*3/uL (ref 150–400)
RBC: 3.15 MIL/uL — ABNORMAL LOW (ref 4.22–5.81)
RDW: 14.2 % (ref 11.5–15.5)
WBC: 8 10*3/uL (ref 4.0–10.5)
nRBC: 0 % (ref 0.0–0.2)

## 2022-11-25 LAB — CBC WITH DIFFERENTIAL/PLATELET
Abs Immature Granulocytes: 0.03 10*3/uL (ref 0.00–0.07)
Basophils Absolute: 0.1 10*3/uL (ref 0.0–0.1)
Basophils Relative: 1 %
Eosinophils Absolute: 0.7 10*3/uL — ABNORMAL HIGH (ref 0.0–0.5)
Eosinophils Relative: 7 %
HCT: 30.9 % — ABNORMAL LOW (ref 39.0–52.0)
Hemoglobin: 9.9 g/dL — ABNORMAL LOW (ref 13.0–17.0)
Immature Granulocytes: 0 %
Lymphocytes Relative: 19 %
Lymphs Abs: 1.7 10*3/uL (ref 0.7–4.0)
MCH: 29.9 pg (ref 26.0–34.0)
MCHC: 32 g/dL (ref 30.0–36.0)
MCV: 93.4 fL (ref 80.0–100.0)
Monocytes Absolute: 0.8 10*3/uL (ref 0.1–1.0)
Monocytes Relative: 9 %
Neutro Abs: 5.9 10*3/uL (ref 1.7–7.7)
Neutrophils Relative %: 64 %
Platelets: 187 10*3/uL (ref 150–400)
RBC: 3.31 MIL/uL — ABNORMAL LOW (ref 4.22–5.81)
RDW: 14.3 % (ref 11.5–15.5)
WBC: 9.1 10*3/uL (ref 4.0–10.5)
nRBC: 0 % (ref 0.0–0.2)

## 2022-11-25 LAB — PROTIME-INR
INR: 1.2 (ref 0.8–1.2)
Prothrombin Time: 15 seconds (ref 11.4–15.2)

## 2022-11-25 LAB — URINALYSIS, ROUTINE W REFLEX MICROSCOPIC
Bilirubin Urine: NEGATIVE
Glucose, UA: NEGATIVE mg/dL
Hgb urine dipstick: NEGATIVE
Ketones, ur: NEGATIVE mg/dL
Nitrite: NEGATIVE
Protein, ur: NEGATIVE mg/dL
Specific Gravity, Urine: 1.011 (ref 1.005–1.030)
pH: 5 (ref 5.0–8.0)

## 2022-11-25 LAB — RETICULOCYTES
Immature Retic Fract: 6.7 % (ref 2.3–15.9)
RBC.: 3.09 MIL/uL — ABNORMAL LOW (ref 4.22–5.81)
Retic Count, Absolute: 64.3 10*3/uL (ref 19.0–186.0)
Retic Ct Pct: 2.1 % (ref 0.4–3.1)

## 2022-11-25 LAB — IRON AND TIBC
Iron: 32 ug/dL — ABNORMAL LOW (ref 45–182)
Saturation Ratios: 15 % — ABNORMAL LOW (ref 17.9–39.5)
TIBC: 209 ug/dL — ABNORMAL LOW (ref 250–450)
UIBC: 177 ug/dL

## 2022-11-25 LAB — RENAL FUNCTION PANEL
Albumin: 2.9 g/dL — ABNORMAL LOW (ref 3.5–5.0)
Anion gap: 7 (ref 5–15)
BUN: 93 mg/dL — ABNORMAL HIGH (ref 8–23)
CO2: 20 mmol/L — ABNORMAL LOW (ref 22–32)
Calcium: 9.4 mg/dL (ref 8.9–10.3)
Chloride: 111 mmol/L (ref 98–111)
Creatinine, Ser: 3.14 mg/dL — ABNORMAL HIGH (ref 0.61–1.24)
GFR, Estimated: 21 mL/min — ABNORMAL LOW (ref >60)
Glucose, Bld: 123 mg/dL — ABNORMAL HIGH (ref 70–99)
Phosphorus: 4.2 mg/dL (ref 2.5–4.6)
Potassium: 4.7 mmol/L (ref 3.5–5.1)
Sodium: 138 mmol/L (ref 135–145)

## 2022-11-25 LAB — VITAMIN B12: Vitamin B-12: 832 pg/mL (ref 180–914)

## 2022-11-25 LAB — FOLATE: Folate: 7.5 ng/mL (ref >5.9)

## 2022-11-25 LAB — FERRITIN: Ferritin: 152 ng/mL (ref 24–336)

## 2022-11-25 LAB — MAGNESIUM: Magnesium: 1.9 mg/dL (ref 1.7–2.4)

## 2022-11-25 LAB — LACTIC ACID, PLASMA: Lactic Acid, Venous: 0.9 mmol/L (ref 0.5–1.9)

## 2022-11-25 LAB — APTT: aPTT: 30 seconds (ref 24–36)

## 2022-11-25 MED ORDER — SODIUM CHLORIDE 0.9 % IV BOLUS (SEPSIS)
1000.0000 mL | Freq: Once | INTRAVENOUS | Status: AC
  Administered 2022-11-25: 1000 mL via INTRAVENOUS

## 2022-11-25 MED ORDER — POLYETHYLENE GLYCOL 3350 17 G PO PACK
34.0000 g | PACK | Freq: Every day | ORAL | Status: DC
  Administered 2022-11-25 – 2022-11-28 (×3): 34 g via ORAL
  Filled 2022-11-25 (×4): qty 2

## 2022-11-25 MED ORDER — ACETAMINOPHEN 650 MG RE SUPP: 650.0000 mg | Freq: Four times a day (QID) | RECTAL | Status: DC | PRN

## 2022-11-25 MED ORDER — HYDRALAZINE HCL 25 MG PO TABS
25.0000 mg | ORAL_TABLET | Freq: Three times a day (TID) | ORAL | Status: DC
  Administered 2022-11-25 – 2022-11-28 (×10): 25 mg via ORAL
  Filled 2022-11-25 (×10): qty 1

## 2022-11-25 MED ORDER — SENNOSIDES-DOCUSATE SODIUM 8.6-50 MG PO TABS
2.0000 | ORAL_TABLET | Freq: Two times a day (BID) | ORAL | Status: AC
  Administered 2022-11-25 (×2): 2 via ORAL
  Filled 2022-11-25 (×2): qty 2

## 2022-11-25 MED ORDER — BRIMONIDINE TARTRATE 0.2 % OP SOLN
1.0000 [drp] | Freq: Every day | OPHTHALMIC | Status: DC
  Administered 2022-11-25 – 2022-11-28 (×4): 1 [drp] via OPHTHALMIC
  Filled 2022-11-25: qty 5

## 2022-11-25 MED ORDER — SENNOSIDES-DOCUSATE SODIUM 8.6-50 MG PO TABS: 2.0000 | ORAL_TABLET | Freq: Two times a day (BID) | ORAL | Status: DC | PRN

## 2022-11-25 MED ORDER — MONTELUKAST SODIUM 10 MG PO TABS
10.0000 mg | ORAL_TABLET | Freq: Every day | ORAL | Status: DC
  Administered 2022-11-25 – 2022-11-27 (×3): 10 mg via ORAL
  Filled 2022-11-25 (×3): qty 1

## 2022-11-25 MED ORDER — METOPROLOL SUCCINATE ER 50 MG PO TB24
50.0000 mg | ORAL_TABLET | Freq: Every day | ORAL | Status: DC
  Administered 2022-11-25 – 2022-11-27 (×3): 50 mg via ORAL
  Filled 2022-11-25 (×3): qty 1

## 2022-11-25 MED ORDER — ONDANSETRON HCL 4 MG PO TABS: 4.0000 mg | ORAL_TABLET | Freq: Four times a day (QID) | ORAL | Status: DC | PRN

## 2022-11-25 MED ORDER — AMLODIPINE BESYLATE 10 MG PO TABS
10.0000 mg | ORAL_TABLET | Freq: Every day | ORAL | Status: DC
  Administered 2022-11-25 – 2022-11-28 (×4): 10 mg via ORAL
  Filled 2022-11-25 (×4): qty 1

## 2022-11-25 MED ORDER — DORZOLAMIDE HCL-TIMOLOL MAL 2-0.5 % OP SOLN
1.0000 [drp] | Freq: Every day | OPHTHALMIC | Status: DC
  Administered 2022-11-25 – 2022-11-28 (×4): 1 [drp] via OPHTHALMIC
  Filled 2022-11-25: qty 10

## 2022-11-25 MED ORDER — LATANOPROST 0.005 % OP SOLN
1.0000 [drp] | Freq: Every day | OPHTHALMIC | Status: DC
  Administered 2022-11-25 – 2022-11-27 (×3): 1 [drp] via OPHTHALMIC
  Filled 2022-11-25: qty 2.5

## 2022-11-25 MED ORDER — AZITHROMYCIN 500 MG PO TABS
250.0000 mg | ORAL_TABLET | ORAL | Status: DC
  Administered 2022-11-27: 250 mg via ORAL
  Filled 2022-11-25: qty 1

## 2022-11-25 MED ORDER — SODIUM CHLORIDE 0.9 % IV SOLN: 1000.0000 mL | INTRAVENOUS | Status: DC

## 2022-11-25 MED ORDER — ONDANSETRON HCL 4 MG/2ML IJ SOLN: 4.0000 mg | Freq: Four times a day (QID) | INTRAMUSCULAR | Status: DC | PRN

## 2022-11-25 MED ORDER — PANTOPRAZOLE SODIUM 40 MG PO TBEC
40.0000 mg | DELAYED_RELEASE_TABLET | Freq: Every day | ORAL | Status: DC
  Administered 2022-11-25 – 2022-11-28 (×4): 40 mg via ORAL
  Filled 2022-11-25 (×4): qty 1

## 2022-11-25 MED ORDER — SODIUM CHLORIDE 0.45 % IV SOLN: INTRAVENOUS | Status: AC

## 2022-11-25 MED ORDER — HEPARIN SODIUM (PORCINE) 5000 UNIT/ML IJ SOLN
5000.0000 [IU] | Freq: Three times a day (TID) | INTRAMUSCULAR | Status: DC
  Administered 2022-11-25 – 2022-11-28 (×10): 5000 [IU] via SUBCUTANEOUS
  Filled 2022-11-25 (×9): qty 1

## 2022-11-25 MED ORDER — ATORVASTATIN CALCIUM 40 MG PO TABS
40.0000 mg | ORAL_TABLET | Freq: Every day | ORAL | Status: DC
  Administered 2022-11-25 – 2022-11-28 (×4): 40 mg via ORAL
  Filled 2022-11-25 (×4): qty 1

## 2022-11-25 MED ORDER — ACETAMINOPHEN 325 MG PO TABS
650.0000 mg | ORAL_TABLET | Freq: Four times a day (QID) | ORAL | Status: DC | PRN
  Administered 2022-11-27: 650 mg via ORAL
  Filled 2022-11-25: qty 2

## 2022-11-25 MED ORDER — SODIUM CHLORIDE 0.9 % IV SOLN
1.0000 g | Freq: Once | INTRAVENOUS | Status: AC
  Administered 2022-11-25: 1 g via INTRAVENOUS
  Filled 2022-11-25: qty 10

## 2022-11-25 MED ORDER — LEVETIRACETAM 500 MG PO TABS
500.0000 mg | ORAL_TABLET | Freq: Two times a day (BID) | ORAL | Status: DC
  Administered 2022-11-25 – 2022-11-28 (×7): 500 mg via ORAL
  Filled 2022-11-25 (×7): qty 1

## 2022-11-25 NOTE — Assessment & Plan Note (Signed)
Verified DNR with wife. Verified DNR form. Verified Vynca.

## 2022-11-25 NOTE — Subjective & Objective (Signed)
CC: weakness, lethargy HPI: 72 year old male with a history of prior stroke, history of primary lateral sclerosis, history of pacemaker, dysphagia on a pured diet, seizure disorder, chronic indwelling Foley due to neurogenic bladder, CKD stage IV baseline creatinine 2.2-2.4 presents to the ER today with increased lethargy and weakness at home.  He lives at home with his wife [name].  She has noticed increasing lethargy this past week.  No fevers.  No chills.  She brought him to the ER.  On arrival temp 97.7 heart rate 68 blood pressure 140/86 satting 97% on room air.  Labs showed a white count of 9.1, hemoglobin 9.9, platelets of 187  Lactic acid of 0.9  Sodium 136, potassium 5.2, BUN of 101, creatinine 3.5  Foley catheter was changed out in the ER.  Chest x-ray was negative.  CT urogram demonstrated some inflammation noticed in his rectum may represent early impaction.  Some mild rectal wall thickening.  Due to patient's acute on chronic renal failure and dehydration, [facility] hospitalist contacted for admission.

## 2022-11-25 NOTE — H&P (Signed)
History and Physical    Darrell [name] [id number] DOB: 1951/03/06 DOA: 11/24/2022  DOS: the patient was seen and examined on 11/24/2022  PCP: [name], MD   Patient coming from: Home  I have personally briefly reviewed patient's old medical records in [place]  CC: weakness, lethargy HPI: 72 year old male with a history of prior stroke, history of primary lateral sclerosis, history of pacemaker, dysphagia on a pured diet, seizure disorder, chronic indwelling Foley due to neurogenic bladder, CKD stage IV baseline creatinine 2.2-2.4 presents to the ER today with increased lethargy and weakness at home.  He lives at home with his wife Darrell [name].  She has noticed increasing lethargy this past week.  No fevers.  No chills.  She brought him to the ER.  On arrival temp 97.7 heart rate 68 blood pressure 140/86 satting 97% on room air.  Labs showed a white count of 9.1, hemoglobin 9.9, platelets of 187  Lactic acid of 0.9  Sodium 136, potassium 5.2, BUN of 101, creatinine 3.5  Foley catheter was changed out in the ER.  Chest x-ray was negative.  CT urogram demonstrated some inflammation noticed in his rectum may represent early impaction.  Some mild rectal wall thickening.  Due to patient's acute on chronic renal failure and dehydration, [facility] contacted for admission.   ED Course: BUN 101, Scr 3.54  Review of Systems:  Review of Systems  Constitutional: Negative.   HENT: Negative.    Eyes: Negative.   Respiratory: Negative.    Cardiovascular: Negative.   Gastrointestinal:  Positive for constipation.  Genitourinary: Negative.   Musculoskeletal: Negative.   Skin: Negative.   Neurological:  Positive for weakness.  Endo/Heme/Allergies: Negative.   Psychiatric/Behavioral: Negative.      Past Medical History:  Diagnosis Date   Arthritis    Asthma    At high risk for falls 08/16/2015   CIDP (chronic inflammatory demyelinating polyneuropathy) ([name])     CKD (chronic kidney disease) stage 3, GFR 30-59 ml/min ([name]) 08/16/2015   Dysphagia as late effect of cerebrovascular disease    pts wife states pt has to eat soft foods    Elevated liver enzymes 08/10/2016   GERD (gastroesophageal reflux disease)    Glaucoma    High cholesterol    History of CVA with residual deficit 03/25/2013   Hypertension    Hypertensive retinopathy of both eyes 01/16/2017   Inguinal hernia 03/25/2013   Liver hemangioma 08/14/2016   New onset seizure ([place]) 07/08/2017   seizure 07/14/18   Nuclear sclerosis of both eyes 10/25/2016   Pneumonia    Presence of permanent cardiac pacemaker    Primary open angle glaucoma of both eyes, indeterminate stage 10/25/2016   Renal mass, right 08/14/2016   Status cardiac pacemaker 01/29/2017   Placed for second degree heart block on 01/16/17 Medtronic Azure XT DR MRI SureScan dual-chamber pacemaker   Stroke [facility])    2011 with residual deficit left sided weakness   Tobacco dependence     Past Surgical History:  Procedure Laterality Date   EYE SURGERY     HERNIA REPAIR     INGUINAL HERNIA REPAIR Right 11/23/2014   Procedure: right inguinal hernia repair with mesh;  Surgeon: Armandina Gemma, MD;  Location: [facility];  Service: General;  Laterality: Right;   INSERTION OF MESH N/A 11/23/2014   Procedure: INSERTION OF MESH;  Surgeon: Armandina Gemma, MD;  Location: [facility];  Service: General;  Laterality: N/A;   JOINT REPLACEMENT  MASS EXCISION Left 08/29/2017   Procedure: EXCISION OF LEFT NECK MASS;  Surgeon: Coralie Keens, MD;  Location: [place];  Service: General;  Laterality: Left;   PACEMAKER IMPLANT N/A 01/16/2017   Procedure: Pacemaker Implant;  Surgeon: Will Meredith Leeds, MD;  Location: [facility];  Service: Cardiovascular;  Laterality: N/A;   SHOULDER SURGERY Bilateral 1988, 1998     reports that he has been smoking cigarettes. He has a 10.00 pack-year smoking history. He has never used smokeless tobacco. He  reports that he does not drink alcohol and does not use drugs.  Allergies  Allergen Reactions   Ace Inhibitors Other (See Comments)    Hyperkalemia   Doxycycline Other (See Comments)    Hiccups, cough, nausea and emesis, elevated liver enzymes, elevated eosinophils, SOB concerning for early DRESS syndrome    [place]and Hct [Candesartan Cilexetil-Hctz] Hives   Shellfish Allergy Hives    Family History  Problem Relation Age of Onset   Hypertension Mother    Diabetes Sister    Hypertension Sister    Cancer Sister        1 sister   COPD Sister        in 1 sister   Stomach cancer Neg Hx    Colon cancer Neg Hx    Pancreatic cancer Neg Hx    Esophageal cancer Neg Hx     Prior to Admission medications   Medication Sig Start Date End Date Taking? Authorizing Provider  albuterol (VENTOLIN HFA) 108 (90 Base) MCG/ACT inhaler Inhale 2 puffs into the lungs every 6 (six) hours as needed for up to 30 days for Wheezing. 06/08/21  Yes Freeman Caldron M, PA-C  amLODipine (NORVASC) 10 MG tablet Take 1 tablet (10 mg total) by mouth daily. 06/07/22  Yes [name], MD  atorvastatin (LIPITOR) 40 MG tablet Take 1 tablet (40 mg total) by mouth daily. 06/07/22  Yes [name], MD  azithromycin (ZITHROMAX) 250 MG tablet Take 1 tablet (250 mg total) by mouth 3 (three) times a week. Take on Mon, Wed, Fri 10/04/22  Yes Olalere, Adewale A, MD  brimonidine (ALPHAGAN) 0.2 % ophthalmic solution Place 1 drop into both eyes daily. 07/25/21  Yes [provider]  cetirizine (ZYRTEC) 10 MG tablet Take 10 mg by mouth daily.   Yes [provider]  dicyclomine (BENTYL) 10 MG capsule TAKE 1 CAPSULE BY MOUTH ONCE DAILY AS NEEDED Patient taking differently: Take 10 mg by mouth daily as needed for spasms. 08/30/22  Yes [name], MD  Docusate Sodium 100 MG capsule Take 100 mg by mouth See admin instructions. Take 1 capsule by mouth in the morning and at night every other day 09/21/22  Yes [provider]  dorzolamide-timolol (COSOPT) 22.3-6.8 MG/ML ophthalmic solution Place 1 drop into both eyes daily.   Yes [provider]  finasteride (PROSCAR) 5 MG tablet Take 5 mg by mouth daily. 12/13/21  Yes [provider]  fluticasone furoate-vilanterol (BREO ELLIPTA) 200-25 MCG/ACT AEPB Inhale 2 puffs into the lungs daily as needed (for shortness of breath).   Yes [provider]  glycopyrrolate (ROBINUL) 1 MG tablet Take 1 mg by mouth 2 (two) times daily. 10/21/22  Yes [provider]  hydrALAZINE (APRESOLINE) 25 MG tablet Take 1 tablet (25 mg total) by mouth 3 (three) times daily. 04/25/22  Yes Shirley Friar, PA-C  latanoprost (XALATAN) 0.005 % ophthalmic solution Place 1 drop into both eyes at bedtime. 07/25/21  Yes [provider]  levETIRAcetam (KEPPRA) 500 MG tablet Take 1 tablet (500 mg total) by mouth 2 (two) times daily. 12/19/21  Yes Penumalli, Earlean Polka, MD  magnesium citrate SOLN Take 1 Bottle by mouth daily as needed for moderate constipation.   Yes [provider]  metoCLOPramide (REGLAN) 5 MG tablet Take 1 tablet (5 mg total) by mouth 3 (three) times daily before meals. Patient taking differently: Take 5 mg by mouth daily as needed for nausea. 09/16/22 11/25/23 Yes Pahwani, Rinka R, MD  metoprolol succinate (TOPROL-XL) 50 MG 24 hr tablet Take 1 tablet (50 mg total) by mouth at bedtime. Take with or immediately following a meal. 11/06/22  Yes Camnitz, Will Hassell Done, MD  montelukast (SINGULAIR) 10 MG tablet Take 1 tablet by mouth at bedtime. 06/19/22  Yes [provider]  Multiple Vitamin (MULTI VITAMIN) TABS Take 1 tablet by mouth daily.   Yes [provider]  pantoprazole (PROTONIX) 40 MG tablet Take 1 tablet (40 mg total) by mouth 2 (two) times daily. Patient taking differently: Take 40 mg by mouth daily. 09/16/22 11/25/23 Yes Pahwani, Rinka R, MD  tamsulosin (FLOMAX) 0.4 MG CAPS capsule Take 1 capsule (0.4 mg  total) by mouth daily. 01/24/22  Yes [name], MD  torsemide (DEMADEX) 20 MG tablet Take 20 mg by mouth daily as needed (fluid).   Yes [provider]  polyethylene glycol powder (MIRALAX) 17 GM/SCOOP powder 1 scoop mixed with 8 ounces of fluid Orally Once a day Patient not taking: Reported on 11/25/2022 09/21/22   [provider]    Physical Exam: Vitals:   11/24/22 2248 11/24/22 2300 11/24/22 2330 11/25/22 0130  BP: (!) 140/86 (!) 128/90 (!) 145/81 (!) 149/79  Pulse: 68 60 64 (!) 59  Resp: '13 13 14 13  '$ Temp: 97.7 F (36.5 C)     TempSrc: Rectal     SpO2: 97% 98% 100% 99%    Physical Exam Vitals and nursing note reviewed.  Constitutional:      General: He is not in acute distress.    Appearance: He is not toxic-appearing.     Comments: Chronically ill appearing, thin, male  HENT:     Head: Normocephalic and atraumatic.     Nose: Nose normal.  Cardiovascular:     Rate and Rhythm: Normal rate and regular rhythm.     Pulses: Normal pulses.  Pulmonary:     Effort: Pulmonary effort is normal.     Breath sounds: Normal breath sounds.  Abdominal:     General: Bowel sounds are normal. There is no distension.  Musculoskeletal:     Right lower leg: No edema.     Left lower leg: No edema.  Skin:    General: Skin is warm and dry.     Capillary Refill: Capillary refill takes less than 2 seconds.  Neurological:     Comments: Masked facies  Right hand with flexion contractures      Labs on Admission: I have personally reviewed following labs and imaging studies  CBC: Recent Labs  Lab 11/24/22 2338  WBC 9.1  NEUTROABS 5.9  HGB 9.9*  HCT 30.9*  MCV 93.4  PLT [id number]   Basic Metabolic Panel: Recent Labs  Lab 11/24/22 2338  NA 136  K 5.2*  CL 108  CO2 20*  GLUCOSE 120*  BUN 101*  CREATININE 3.54*  CALCIUM 9.8   GFR: CrCl cannot be calculated (Unknown ideal weight.). Liver Function Tests: Recent Labs  Lab 11/24/22 2338  AST  14*  ALT 10   ALKPHOS 79  BILITOT 0.6  PROT 6.4*  ALBUMIN 3.2*   No results for input(s): "LIPASE", "AMYLASE" in the last 168 hours. No results for input(s): "AMMONIA" in the last 168 hours. Coagulation Profile: Recent Labs  Lab 11/24/22 2338  INR 1.2   Cardiac Enzymes: No results for input(s): "CKTOTAL", "CKMB", "CKMBINDEX", "TROPONINI", "TROPONINIHS" in the last 168 hours. BNP (last 3 results) No results for input(s): "PROBNP" in the last 8760 hours. HbA1C: No results for input(s): "HGBA1C" in the last 72 hours. CBG: No results for input(s): "GLUCAP" in the last 168 hours. Lipid Profile: No results for input(s): "CHOL", "HDL", "LDLCALC", "TRIG", "CHOLHDL", "LDLDIRECT" in the last 72 hours. Thyroid Function Tests: No results for input(s): "TSH", "T4TOTAL", "FREET4", "T3FREE", "THYROIDAB" in the last 72 hours. Anemia Panel: No results for input(s): "VITAMINB12", "FOLATE", "FERRITIN", "TIBC", "IRON", "RETICCTPCT" in the last 72 hours. Urine analysis:    Component Value Date/Time   COLORURINE YELLOW 11/25/2022 0005   APPEARANCEUR HAZY (A) 11/25/2022 0005   LABSPEC 1.011 11/25/2022 0005   PHURINE 5.0 11/25/2022 0005   GLUCOSEU NEGATIVE 11/25/2022 0005   HGBUR NEGATIVE 11/25/2022 0005   BILIRUBINUR NEGATIVE 11/25/2022 0005   BILIRUBINUR negative 09/07/2017 1645   KETONESUR NEGATIVE 11/25/2022 0005   PROTEINUR NEGATIVE 11/25/2022 0005   UROBILINOGEN 0.2 09/07/2017 1645   UROBILINOGEN 4.0 (H) 08/04/2016 1517   NITRITE NEGATIVE 11/25/2022 0005   LEUKOCYTESUR LARGE (A) 11/25/2022 0005    Radiological Exams on Admission: I have personally reviewed images CT Renal Stone Study  Result Date: 11/25/2022 CLINICAL DATA:  Acute renal injury, possible obstruction EXAM: CT ABDOMEN AND PELVIS WITHOUT CONTRAST TECHNIQUE: Multidetector CT imaging of the abdomen and pelvis was performed following the standard protocol without IV contrast. RADIATION DOSE REDUCTION: This exam was performed according to  the departmental dose-optimization program which includes automated exposure control, adjustment of the mA and/or kV according to patient size and/or use of iterative reconstruction technique. COMPARISON:  09/11/2022 FINDINGS: Lower chest: Lung bases are free of acute infiltrate or sizable effusion. Hepatobiliary: No focal liver abnormality is seen. No gallstones, gallbladder wall thickening, or biliary dilatation. Pancreas: Unremarkable. No pancreatic ductal dilatation or surrounding inflammatory changes. Spleen: Normal in size without focal abnormality. Adrenals/Urinary Tract: Adrenal glands are within normal limits. Kidneys demonstrate multiple cystic lesions bilaterally some of which demonstrate calcifications within. These are stable in appearance from the prior exam. No further follow-up is recommended. No obstructive changes are noted. The bladder is decompressed by Foley catheter. Stomach/Bowel: Some retained fecal material is noted within the rectum which may represent some early impaction. Mild wall thickening is noted similar to that seen on prior exam suspicious again for stercoral colitis. No obstructive changes are seen. The appendix is within normal limits. Small bowel and stomach are unremarkable. Vascular/Lymphatic: Atherosclerotic calcifications are noted without aneurysmal dilatation. No significant lymphadenopathy is seen. Reproductive: Prostate is unremarkable. Other: No abdominal wall hernia or abnormality. No abdominopelvic ascites. Musculoskeletal: Degenerative changes of lumbar spine are noted. IMPRESSION: No evidence of urinary tract obstructive changes. Changes suspicious for stercoral colitis the rectum. These findings are stable from the prior exam. Remainder of the study is stable from the previous exam. Electronically Signed   By: Inez Catalina M.D.   On: 11/25/2022 01:22   DG Chest Port 1 View  Result Date: 11/25/2022 CLINICAL DATA:  Decreased level of consciousness, aphasia EXAM:  PORTABLE CHEST 1 VIEW COMPARISON:  09/06/2022 FINDINGS: Lungs are clear.  No pleural  effusion or pneumothorax. The heart is normal in size.  Left subclavian pacemaker. IMPRESSION: No evidence of acute cardiopulmonary disease. Electronically Signed   By: Julian Hy M.D.   On: 11/25/2022 00:08    EKG: My personal interpretation of EKG shows: paced rhythm    Assessment/Plan Principal Problem:   Acute renal failure superimposed on stage 4 chronic kidney disease ([name]) Active Problems:   History of CVA with residual deficit   Status cardiac pacemaker   Seizure disorder ([place])   Dysphagia - pureed diet, honey-thick liquids   Primary lateral sclerosis ([name])   CKD (chronic kidney disease), stage IV ([name]) - baseline SCr 2.2-2.4   Fecal impaction ([name])   DNR (do not resuscitate)   Chronic indwelling Foley catheter    Assessment and Plan: * Acute renal failure superimposed on stage 4 chronic kidney disease ([place]) Admit to observation med/surg bed. Continue with 0.45% IVF. Discussed with wife that pt should be drinking between 60-90 ounces of water/liquids per day. Hold demadex. Wife states she was told by nephrology to give demadex everyday.  Chronic anticoagulation Stable.  Chronic indwelling Foley catheter Chronic. Foley changed out in ER.  DNR (do not resuscitate) Verified DNR with wife. Verified DNR form. Verified Vynca.    Fecal impaction ([place]) Pt only taking colace at home. Advised wife that she should not give pt mag citrate for constipation. Will need manual fecal disimpaction and soap suds enemas. Avoid fleet enemas.  CKD (chronic kidney disease), stage IV ([name]) - baseline SCr 2.2-2.4 Acutely worse due to dehydration and ARF. Follows with Dr. Joelyn Oms with [facility]  Primary lateral sclerosis [facility]) Follows with ALS clinic at [facility].  Dysphagia - pureed diet, honey-thick liquids Continue with pureed diet/honey thick liquids. Pt is a aspiration risk.  Seizure  disorder ([name]) Chronic. Continue keppra  Status cardiac pacemaker Stable.  History of CVA with residual deficit Chronic.   DVT prophylaxis: SQ Heparin Code Status: DNR/DNI(Do NOT Intubate). Verified at bedside with wife Darrell [name]. Verified DNR form. Verified Vynca. Family Communication: discussed at bedside with wife Darrell [name]  Disposition Plan: return home  Consults called: none  Admission status: Observation, Med-Surg   Kristopher Oppenheim, DO [facility] 11/25/2022, 2:44 AM

## 2022-11-25 NOTE — Assessment & Plan Note (Signed)
Follows with ALS clinic at [facility].

## 2022-11-25 NOTE — Progress Notes (Signed)
PROGRESS NOTE  Darrell [name] [id number] DOB: 09/09/1951   PCP: [name], MD  Patient is from: Home.  Totally dependent for ADLs.  DOA: 11/24/2022 LOS: 0  Chief complaints Chief Complaint  Patient presents with   Aphasia   Altered Mental Status     Brief Narrative / Interim history: 72 year old M with PMH of CKD-4, chronic indwelling Foley due to neurogenic bladder, prior CVA, primary lateral sclerosis, dysphagia 1 on pured diet and seizure disorder presenting with generalized weakness and lethargy, and admitted for AKI on CKD-4 and dehydration.  Reportedly had poor p.o. intake although drinking fluids well.  Per wife, poor p.o. intake although drinking fluids well.  Also on torsemide "for kidney".  Takes ibuprofen daily.  In ED, stable vitals.  Cr 3.5 (baseline 2.2-2.4).  BUN 101.  K5.2.  Hgb 9.9.  Lactic acid negative.  UA with large LE, negative nitrite and rare bacteria.  CT renal stone study shows changes suspicious for stercoral colitis the rectum but is stable from prior exam.  Patient was started on IV fluid and admitted.  Subjective: Seen and examined earlier this morning.  No major events overnight of this morning.  Wife at bedside feeding patient.  Patient has no complaints and told and moved his legs for exam, and that he felt pain in his right ankle.  Denies chest pain or dyspnea.  Denies nausea, vomiting or abdominal pain.   Objective: Vitals:   11/25/22 0330 11/25/22 0334 11/25/22 0420 11/25/22 0902  BP: (!) 152/79  (!) 143/84 (!) 144/82  Pulse: 60  64 63  Resp: '12  12 11  '$ Temp:  (!) 97.5 F (36.4 C) 97.6 F (36.4 C) (!) 97.5 F (36.4 C)  TempSrc:  Axillary Oral Oral  SpO2: 100%   99%    Examination:  GENERAL: Appears frail.  No apparent distress. HEENT: MMM.  Vision and hearing grossly intact.  NECK: Supple.  No apparent JVD.  RESP:  No IWOB.  Fair aeration bilaterally. CVS:  RRR. Heart sounds normal.  ABD/GI/GU: BS+. Abd soft, NTND.  Indwelling  Foley catheter. MSK/EXT: Moves RUE very well.  LUE contractures.  Does not move BLE.  Significant muscle mass and subcu fat loss. SKIN: no apparent skin lesion or wound NEURO: Awake and oriented to self, person, city and state.  LUE and BLE paresis and contractures. PSYCH: Calm. Normal affect.   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 intake, diuretics and NSAID use.  No obstruction on CT renal.  Seems to have good urine output. Recent Labs    08/12/22 0508 09/06/22 0743 09/07/22 0256 09/08/22 0707 09/09/22 0225 09/10/22 0209 09/11/22 0343 09/12/22 0652 09/13/22 0218 11/24/22 2338  BUN 53* 87* 91* 108* 88* 65* 49* 38* 33* 101*  CREATININE 2.57* 3.64* 3.56* 3.80* 3.15* 2.61* 2.38* 2.22* 2.22* 3.54*  -Foley catheter exchanged in ED. -Continue 1/2 NS at 100 cc an hour -Check renal panel -Patient  is not a candidate for dialysis.  Anemia of chronic disease: Hgb higher than baseline.  Likely hemoconcentration. Recent Labs    09/08/22 0707 09/08/22 1903 09/09/22 0225 09/09/22 0230 09/09/22 2134 09/10/22 0209 09/11/22 0343 09/12/22 0652 09/13/22 0218 11/24/22 2338  HGB 6.8* 7.4* 7.2* 7.2* 8.6* 8.6* 8.0* 7.9* 8.1* 9.9*  -Monitor -Check anemia panel  Primary lateral sclerosis: Patient with paresis and contractures as above.  Follow[facility] with [facility] at [facility].  Totally dependent for ADLs.  Wheelchair dependent  for mobility.  Wife and brother-in-law helps with transfers.  He is DNR and DNI which is appropriate. -May have palliative involved if renal function does not improve -PT/OT eval  Neurogenic bladder/BPH/chronic indwelling Foley catheter -Foley catheter exchanged in ED.   Possible stable stercoral colitis of rectum: Noted on CT renal stone study.  Wife reports regular bowel movements.  She says she had 2 bowel movements yesterday.  No diarrhea.  Abdominal exam benign. -Bowel regimen  Dysphagia - pureed diet, honey-thick liquids -Continue with pureed diet/honey thick liquids. Pt is a aspiration risk.   Seizure disorder ([place]) -Chronic. Continue keppra   AVB status PPM: Stable.    History of CVA with residual deficit-including LUE contracture and BLE paralysis.  History of DVT: Does not seem to be on anticoagulation anymore.  Pyuria: Likely due to chronic Foley versus UTI.  Hyperkalemia: -IV fluid as above -Recheck    There is no height or weight on file to calculate BMI.   Pressure skin injury: POA.  Noted small clean looking wound below his knee laterally. -Wound care consult  DVT prophylaxis:  heparin injection 5,000 Units Start: 11/25/22 0600 SCDs Start: 11/25/22 0441  Code Status: DNR/DNI Family Communication: Updated patient's wife at bedside Level of care: Med-Surg Status is: Observation The patient will require care spanning > 2 midnights and should be moved to inpatient because: AKI on CKD-4   Final disposition: TBD Consultants:  None  35 minutes with more than 50% spent in reviewing records, counseling patient/family and coordinating care.   Sch Meds:  Scheduled Meds:  amLODipine  10 mg Oral Daily   atorvastatin  40 mg Oral Daily   [START ON 11/27/2022] azithromycin  250 mg Oral Once per day on Mon Wed Fri   brimonidine  1 drop Both Eyes Daily   dorzolamide-timolol  1 drop Both Eyes Daily   heparin  5,000 Units Subcutaneous Q8H   hydrALAZINE  25 mg  Oral TID   latanoprost  1 drop Both Eyes QHS   levETIRAcetam  500 mg Oral BID   metoprolol succinate  50 mg Oral QHS   montelukast  10 mg Oral QHS   pantoprazole  40 mg Oral Daily   polyethylene glycol  34 g Oral Daily   senna-docusate  2 tablet Oral BID   Continuous Infusions:  sodium chloride 100 mL/hr at 11/25/22 0332   PRN Meds:.acetaminophen **OR** acetaminophen, ondansetron **OR** ondansetron (ZOFRAN) IV, senna-docusate **FOLLOWED BY** [START ON 11/26/2022] senna-docusate  Antimicrobials: Anti-infectives (From admission, onward)    Start     Dose/Rate Route Frequency Ordered Stop   11/27/22 0900  azithromycin (ZITHROMAX) tablet 250 mg       Note to Pharmacy: Take on Mon, Wed, Fri     250 mg Oral Once per day on Mon Wed Fri 11/25/22 0440     11/25/22 0130  cefTRIAXone (ROCEPHIN) 1 g in sodium chloride 0.9 % 100 mL IVPB        1 g  200 mL/hr over 30 Minutes Intravenous  Once 11/25/22 0124 11/25/22 0212        I have personally reviewed the following labs and images: CBC: Recent Labs  Lab 11/24/22 2338  WBC 9.1  NEUTROABS 5.9  HGB 9.9*  HCT 30.9*  MCV 93.4  PLT 187   BMP &GFR Recent Labs  Lab 11/24/22 2338  NA 136  K 5.2*  CL 108  CO2 20*  GLUCOSE 120*  BUN 101*  CREATININE 3.54*  CALCIUM 9.8   CrCl cannot be calculated (Unknown ideal weight.). Liver & Pancreas: Recent Labs  Lab 11/24/22 2338  AST 14*  ALT 10  ALKPHOS 79  BILITOT 0.6  PROT 6.4*  ALBUMIN 3.2*   No results for input(s): "LIPASE", "AMYLASE" in the last 168 hours. No results for input(s): "AMMONIA" in the last 168 hours. Diabetic: No results for input(s): "HGBA1C" in the last 72 hours. No results for input(s): "GLUCAP" in the last 168 hours. Cardiac Enzymes: No results for input(s): "CKTOTAL", "CKMB", "CKMBINDEX", "TROPONINI" in the last 168 hours. No results for input(s): "PROBNP" in the last 8760 hours. Coagulation Profile: Recent Labs  Lab 11/24/22 2338  INR 1.2   Thyroid  Function Tests: No results for input(s): "TSH", "T4TOTAL", "FREET4", "T3FREE", "THYROIDAB" in the last 72 hours. Lipid Profile: No results for input(s): "CHOL", "HDL", "LDLCALC", "TRIG", "CHOLHDL", "LDLDIRECT" in the last 72 hours. Anemia Panel: No results for input(s): "VITAMINB12", "FOLATE", "FERRITIN", "TIBC", "IRON", "RETICCTPCT" in the last 72 hours. Urine analysis:    Component Value Date/Time   COLORURINE YELLOW 11/25/2022 0005   APPEARANCEUR HAZY (A) 11/25/2022 0005   LABSPEC 1.011 11/25/2022 0005   PHURINE 5.0 11/25/2022 0005   GLUCOSEU NEGATIVE 11/25/2022 0005   HGBUR NEGATIVE 11/25/2022 0005   BILIRUBINUR NEGATIVE 11/25/2022 0005   BILIRUBINUR negative 09/07/2017 1645   KETONESUR NEGATIVE 11/25/2022 0005   PROTEINUR NEGATIVE 11/25/2022 0005   UROBILINOGEN 0.2 09/07/2017 1645   UROBILINOGEN 4.0 (H) 08/04/2016 1517   NITRITE NEGATIVE 11/25/2022 0005   LEUKOCYTESUR LARGE (A) 11/25/2022 0005   Sepsis Labs: Invalid input(s): "PROCALCITONIN", "LACTICIDVEN"  Microbiology: No results found for this or any previous visit (from the past 240 hour(s)).  Radiology Studies: CT Renal Stone Study  Result Date: 11/25/2022 CLINICAL DATA:  Acute renal injury, possible obstruction EXAM: CT ABDOMEN AND PELVIS WITHOUT CONTRAST TECHNIQUE: Multidetector CT imaging of the abdomen and pelvis was performed following the standard protocol without IV contrast. RADIATION DOSE REDUCTION: This exam was performed according to the departmental dose-optimization program which includes automated exposure control, adjustment of the mA and/or kV according to patient size and/or use of iterative reconstruction technique. COMPARISON:  09/11/2022 FINDINGS: Lower chest: Lung bases are free of acute infiltrate or sizable effusion. Hepatobiliary: No focal liver abnormality is seen. No gallstones, gallbladder wall thickening, or biliary dilatation. Pancreas: Unremarkable. No pancreatic ductal dilatation or surrounding  inflammatory changes. Spleen: Normal in size without focal abnormality. Adrenals/Urinary Tract: Adrenal glands are within normal limits. Kidneys demonstrate multiple cystic lesions bilaterally some of which demonstrate calcifications within. These are stable in appearance from the prior exam. No further follow-up is recommended. No obstructive changes are noted. The bladder is decompressed by Foley catheter. Stomach/Bowel: Some retained fecal material is noted within the rectum which may represent some early impaction. Mild wall thickening is noted similar to that seen on prior exam suspicious again for stercoral colitis. No obstructive changes are seen. The appendix is within normal limits. Small bowel and stomach are unremarkable. Vascular/Lymphatic:  Atherosclerotic calcifications are noted without aneurysmal dilatation. No significant lymphadenopathy is seen. Reproductive: Prostate is unremarkable. Other: No abdominal wall hernia or abnormality. No abdominopelvic ascites. Musculoskeletal: Degenerative changes of lumbar spine are noted. IMPRESSION: No evidence of urinary tract obstructive changes. Changes suspicious for stercoral colitis the rectum. These findings are stable from the prior exam. Remainder of the study is stable from the previous exam. Electronically Signed   By: Inez Catalina M.D.   On: 11/25/2022 01:22   DG Chest Port 1 View  Result Date: 11/25/2022 CLINICAL DATA:  Decreased level of consciousness, aphasia EXAM: PORTABLE CHEST 1 VIEW COMPARISON:  09/06/2022 FINDINGS: Lungs are clear.  No pleural effusion or pneumothorax. The heart is normal in size.  Left subclavian pacemaker. IMPRESSION: No evidence of acute cardiopulmonary disease. Electronically Signed   By: Julian Hy M.D.   On: 11/25/2022 00:08      Khamani Daniely T. Soquel  If 7PM-7AM, please contact night-coverage [email or web address] 11/25/2022, 11:18 AM

## 2022-11-25 NOTE — ED Notes (Signed)
ED TO INPATIENT HANDOFF REPORT  ED Nurse Name and Phone #: Gennaro Africa [id number] Name/Age/Gender Darrell [name] 72 y.o. male Room/Bed: 027C/027C  Code Status   Code Status: Prior  Home/SNF/Other Home Patient oriented to: self Is this baseline? Yes   Triage Complete: Triage complete  Chief Complaint Acute renal failure superimposed on stage 4 chronic kidney disease ([place]) [N17.9, N18.4]  Triage Note Patient arrives with [facility] from home C/O decreased level of consciousness and aphasia. LKW 1000 today. Pt has hx ALS and stroke, per ems family states that patient will normally talk and stay awake but has not since this morning. Family also informs EMS that when this has happen in the past it has been pneumonia. Pt arrives sleeping with indwelling catheter in place.   Allergies Allergies  Allergen Reactions   Ace Inhibitors Other (See Comments)    Hyperkalemia   Doxycycline Other (See Comments)    Hiccups, cough, nausea and emesis, elevated liver enzymes, elevated eosinophils, SOB concerning for early DRESS syndrome    Atacand Hct [Candesartan Cilexetil-Hctz] Hives   Shellfish Allergy Hives    Level of Care/Admitting Diagnosis ED Disposition     ED Disposition  Admit   Condition  --   Comment  Hospital Area: [facility] [[id number]]  Level of Care: Med-Surg [16]  May place patient in observation at [facility] or [place] if equivalent level of care is available:: No  Covid Evaluation: Asymptomatic - no recent exposure (last 10 days) testing not required  Diagnosis: Acute renal failure superimposed on stage 4 chronic kidney disease [id number]  Admitting Physician: Bridgett Larsson, [place]  Attending Physician: Bridgett Larsson, ERIC [[id number]]          B Medical/Surgery History Past Medical History:  Diagnosis Date   Arthritis    Asthma    At high risk for falls 08/16/2015   CIDP (chronic inflammatory demyelinating polyneuropathy) ([name])    CKD (chronic  kidney disease) stage 3, GFR 30-59 ml/min ([place]) 08/16/2015   Dysphagia as late effect of cerebrovascular disease    pts wife states pt has to eat soft foods    Elevated liver enzymes 08/10/2016   GERD (gastroesophageal reflux disease)    Glaucoma    High cholesterol    History of CVA with residual deficit 03/25/2013   Hypertension    Hypertensive retinopathy of both eyes 01/16/2017   Inguinal hernia 03/25/2013   Liver hemangioma 08/14/2016   New onset seizure ([place]) 07/08/2017   seizure 07/14/18   Nuclear sclerosis of both eyes 10/25/2016   Pneumonia    Presence of permanent cardiac pacemaker    Primary open angle glaucoma of both eyes, indeterminate stage 10/25/2016   Renal mass, right 08/14/2016   Status cardiac pacemaker 01/29/2017   Placed for second degree heart block on 01/16/17 Medtronic Azure XT DR MRI SureScan dual-chamber pacemaker   Stroke [facility])    2011 with residual deficit left sided weakness   Tobacco dependence    Past Surgical History:  Procedure Laterality Date   EYE SURGERY     HERNIA REPAIR     INGUINAL HERNIA REPAIR Right 11/23/2014   Procedure: right inguinal hernia repair with mesh;  Surgeon: Armandina Gemma, MD;  Location: [facility];  Service: General;  Laterality: Right;   INSERTION OF MESH N/A 11/23/2014   Procedure: INSERTION OF MESH;  Surgeon: Armandina Gemma, MD;  Location: [facility];  Service: General;  Laterality: N/A;   JOINT REPLACEMENT  MASS EXCISION Left 08/29/2017   Procedure: EXCISION OF LEFT NECK MASS;  Surgeon: Coralie Keens, MD;  Location: [facility];  Service: General;  Laterality: Left;   PACEMAKER IMPLANT N/A 01/16/2017   Procedure: Pacemaker Implant;  Surgeon: Will Meredith Leeds, MD;  Location: [facility];  Service: Cardiovascular;  Laterality: N/A;   SHOULDER SURGERY Bilateral 1988, 1998     A IV Location/Drains/Wounds Patient Lines/Drains/Airways Status     Active Line/Drains/Airways     Name Placement date Placement time Site  Days   Peripheral IV 11/24/22 20 G Anterior;Left Hand 11/24/22  2327  Hand  1   Urethral Catheter Henleigh Robello RN Non-latex 16 Fr. 11/25/22  0004  Non-latex  less than 1   Pressure Injury 05/17/22 Buttocks Right Stage 2 -  Partial thickness loss of dermis presenting as a shallow open injury with a red, pink wound bed without slough. 05/17/22  2300  -- 192   Pressure Injury 09/07/22 Buttocks Right Stage 2 -  Partial thickness loss of dermis presenting as a shallow open injury with a red, pink wound bed without slough. 09/07/22  1129  -- 79   Wound / Incision (Open or Dehisced) 04/13/22 Irritant Dermatitis (Moisture Associated Skin Damage) Buttocks Right;Left;Bilateral soft wrinkied creasing skin peeling off underneith,red and pink wound bed 04/13/22  --  Buttocks  226   Wound / Incision (Open or Dehisced) 08/11/22 Irritant Dermatitis (Moisture Associated Skin Damage) Sacrum Mid 08/11/22  --  Sacrum  106            Intake/Output Last 24 hours No intake or output data in the 24 hours ending 11/25/22 0244  Labs/Imaging Results for orders placed or performed during the hospital encounter of 11/24/22 (from the past 48 hour(s))  Lactic acid, plasma     Status: None   Collection Time: 11/24/22 11:38 PM  Result Value Ref Range   Lactic Acid, Venous 0.9 0.5 - 1.9 mmol/L    Comment: Performed at [facility], [place] [address] [place], [place] [address]  Comprehensive metabolic panel     Status: Abnormal   Collection Time: 11/24/22 11:38 PM  Result Value Ref Range   Sodium 136 135 - 145 mmol/L   Potassium 5.2 (H) 3.5 - 5.1 mmol/L   Chloride 108 98 - 111 mmol/L   CO2 20 (L) 22 - 32 mmol/L   Glucose, Bld 120 (H) 70 - 99 mg/dL    Comment: Glucose reference range applies only to samples taken after fasting for at least 8 hours.   BUN 101 (H) 8 - 23 mg/dL   Creatinine, Ser 3.54 (H) 0.61 - 1.24 mg/dL   Calcium 9.8 8.9 - 10.3 mg/dL   Total Protein 6.4 (L) 6.5 - 8.1 g/dL   Albumin 3.2 (L) 3.5 - 5.0  g/dL   AST 14 (L) 15 - 41 U/L   ALT 10 0 - 44 U/L   Alkaline Phosphatase 79 38 - 126 U/L   Total Bilirubin 0.6 0.3 - 1.2 mg/dL   GFR, Estimated 18 (L) >60 mL/min    Comment: (NOTE) Calculated using the CKD-EPI Creatinine Equation (2021)    Anion gap 8 5 - 15    Comment: Performed at [facility], [place] [address], [place], [place] [address]  CBC with Differential     Status: Abnormal   Collection Time: 11/24/22 11:38 PM  Result Value Ref Range   WBC 9.1 4.0 - 10.5 K/uL   RBC 3.31 (L) 4.22 -  5.81 MIL/uL   Hemoglobin 9.9 (L) 13.0 - 17.0 g/dL   HCT 30.9 (L) 39.0 - 52.0 %   MCV 93.4 80.0 - 100.0 fL   MCH 29.9 26.0 - 34.0 pg   MCHC 32.0 30.0 - 36.0 g/dL   RDW 14.3 11.5 - 15.5 %   Platelets 187 150 - 400 K/uL   nRBC 0.0 0.0 - 0.2 %   Neutrophils Relative % 64 %   Neutro Abs 5.9 1.7 - 7.7 K/uL   Lymphocytes Relative 19 %   Lymphs Abs 1.7 0.7 - 4.0 K/uL   Monocytes Relative 9 %   Monocytes Absolute 0.8 0.1 - 1.0 K/uL   Eosinophils Relative 7 %   Eosinophils Absolute 0.7 (H) 0.0 - 0.5 K/uL   Basophils Relative 1 %   Basophils Absolute 0.1 0.0 - 0.1 K/uL   Immature Granulocytes 0 %   Abs Immature Granulocytes 0.03 0.00 - 0.07 K/uL    Comment: Performed at [facility] [address], [place], [place] [address]  Protime-INR     Status: None   Collection Time: 11/24/22 11:38 PM  Result Value Ref Range   Prothrombin Time 15.0 11.4 - 15.2 seconds   INR 1.2 0.8 - 1.2    Comment: (NOTE) INR goal varies based on device and disease states. Performed at [facility], [place] [address] [place], [place] [address]   APTT     Status: None   Collection Time: 11/24/22 11:38 PM  Result Value Ref Range   aPTT 30 24 - 36 seconds    Comment: Performed at [facility] [address], [place], [place] [address]  Urinalysis, Routine w reflex microscopic -Urine, Catheterized; Indwelling urinary catheter     Status: Abnormal   Collection Time: 11/25/22 12:05 AM  Result Value  Ref Range   Color, Urine YELLOW YELLOW   APPearance HAZY (A) CLEAR   Specific Gravity, Urine 1.011 1.005 - 1.030   pH 5.0 5.0 - 8.0   Glucose, UA NEGATIVE NEGATIVE mg/dL   Hgb urine dipstick NEGATIVE NEGATIVE   Bilirubin Urine NEGATIVE NEGATIVE   Ketones, ur NEGATIVE NEGATIVE mg/dL   Protein, ur NEGATIVE NEGATIVE mg/dL   Nitrite NEGATIVE NEGATIVE   Leukocytes,Ua LARGE (A) NEGATIVE   RBC / HPF 0-5 0 - 5 RBC/hpf   WBC, UA 21-50 0 - 5 WBC/hpf   Bacteria, UA RARE (A) NONE SEEN   Squamous Epithelial / HPF 0-5 0 - 5 /HPF   Mucus PRESENT     Comment: Performed at [facility], [address], [place], [place] [address]   CT Renal Stone Study  Result Date: 11/25/2022 CLINICAL DATA:  Acute renal injury, possible obstruction EXAM: CT ABDOMEN AND PELVIS WITHOUT CONTRAST TECHNIQUE: Multidetector CT imaging of the abdomen and pelvis was performed following the standard protocol without IV contrast. RADIATION DOSE REDUCTION: This exam was performed according to the departmental dose-optimization program which includes automated exposure control, adjustment of the mA and/or kV according to patient size and/or use of iterative reconstruction technique. COMPARISON:  09/11/2022 FINDINGS: Lower chest: Lung bases are free of acute infiltrate or sizable effusion. Hepatobiliary: No focal liver abnormality is seen. No gallstones, gallbladder wall thickening, or biliary dilatation. Pancreas: Unremarkable. No pancreatic ductal dilatation or surrounding inflammatory changes. Spleen: Normal in size without focal abnormality. Adrenals/Urinary Tract: Adrenal glands are within normal limits. Kidneys demonstrate multiple cystic lesions bilaterally some of which demonstrate calcifications within. These are stable in appearance  from the prior exam. No further follow-up is recommended. No obstructive changes are noted. The bladder is decompressed by Foley catheter. Stomach/Bowel: Some retained fecal material is noted within  the rectum which may represent some early impaction. Mild wall thickening is noted similar to that seen on prior exam suspicious again for stercoral colitis. No obstructive changes are seen. The appendix is within normal limits. Small bowel and stomach are unremarkable. Vascular/Lymphatic: Atherosclerotic calcifications are noted without aneurysmal dilatation. No significant lymphadenopathy is seen. Reproductive: Prostate is unremarkable. Other: No abdominal wall hernia or abnormality. No abdominopelvic ascites. Musculoskeletal: Degenerative changes of lumbar spine are noted. IMPRESSION: No evidence of urinary tract obstructive changes. Changes suspicious for stercoral colitis the rectum. These findings are stable from the prior exam. Remainder of the study is stable from the previous exam. Electronically Signed   By: Inez Catalina M.D.   On: 11/25/2022 01:22   DG Chest Port 1 View  Result Date: 11/25/2022 CLINICAL DATA:  Decreased level of consciousness, aphasia EXAM: PORTABLE CHEST 1 VIEW COMPARISON:  09/06/2022 FINDINGS: Lungs are clear.  No pleural effusion or pneumothorax. The heart is normal in size.  Left subclavian pacemaker. IMPRESSION: No evidence of acute cardiopulmonary disease. Electronically Signed   By: Julian Hy M.D.   On: 11/25/2022 00:08    Pending Labs Unresulted Labs (From admission, onward)     Start     Ordered   11/24/22 2319  Remove and replace urinary cath (placed > 5 days) then obtain urine culture from new indwelling urinary catheter.  (Undifferentiated presentation (screening labs and basic nursing orders))  Once,   URGENT       Question:  Indication  Answer:  Altered mental status (if no other cause identified)   11/24/22 2320            Vitals/Pain Today's Vitals   11/24/22 2248 11/24/22 2300 11/24/22 2330 11/25/22 0130  BP: (!) 140/86 (!) 128/90 (!) 145/81 (!) 149/79  Pulse: 68 60 64 (!) 59  Resp: '13 13 14 13  '$ Temp: 97.7 F (36.5 C)     TempSrc: Rectal      SpO2: 97% 98% 100% 99%    Isolation Precautions No active isolations  Medications Medications  sodium chloride 0.9 % bolus 1,000 mL (1,000 mLs Intravenous New Bag/Given 11/25/22 0139)  0.45 % sodium chloride infusion (has no administration in time range)  cefTRIAXone (ROCEPHIN) 1 g in sodium chloride 0.9 % 100 mL IVPB (1 g Intravenous New Bag/Given 11/25/22 0142)    Mobility non-ambulatory     Focused Assessments Cardiac Assessment Handoff:    Lab Results  Component Value Date   TROPONINI <0.03 07/07/2017   Lab Results  Component Value Date   DDIMER 4.75 (H) 05/07/2021   Does the Patient currently have chest pain? No    R Recommendations: See Admitting Provider Note  Report given to:   Additional Notes: Patient from home where he lives with wife who takes care of him less verbal than normal today.

## 2022-11-25 NOTE — Assessment & Plan Note (Signed)
Stable. 

## 2022-11-25 NOTE — Assessment & Plan Note (Addendum)
Acutely worse due to dehydration and ARF. Follows with Dr. Joelyn Oms with [facility]

## 2022-11-25 NOTE — Assessment & Plan Note (Signed)
Continue with pureed diet/honey thick liquids. Pt is a aspiration risk.

## 2022-11-25 NOTE — Assessment & Plan Note (Signed)
Chronic. Foley changed out in ER.

## 2022-11-25 NOTE — Assessment & Plan Note (Signed)
Pt only taking colace at home. Advised wife that she should not give pt mag citrate for constipation. Will need manual fecal disimpaction and soap suds enemas. Avoid fleet enemas.

## 2022-11-25 NOTE — Assessment & Plan Note (Signed)
Chronic. Continue keppra

## 2022-11-25 NOTE — Assessment & Plan Note (Signed)
Admit to observation med/surg bed. Continue with 0.45% IVF. Discussed with wife that pt should be drinking between 60-90 ounces of water/liquids per day. Hold demadex. Wife states she was told by nephrology to give demadex everyday.

## 2022-11-25 NOTE — Assessment & Plan Note (Signed)
Chronic. 

## 2022-11-26 DIAGNOSIS — R8279 Other abnormal findings on microbiological examination of urine: Secondary | ICD-10-CM | POA: Diagnosis not present

## 2022-11-26 DIAGNOSIS — I10 Essential (primary) hypertension: Secondary | ICD-10-CM | POA: Diagnosis not present

## 2022-11-26 DIAGNOSIS — N179 Acute kidney failure, unspecified: Secondary | ICD-10-CM | POA: Diagnosis not present

## 2022-11-26 DIAGNOSIS — I6930 Unspecified sequelae of cerebral infarction: Secondary | ICD-10-CM | POA: Diagnosis not present

## 2022-11-26 DIAGNOSIS — Z978 Presence of other specified devices: Secondary | ICD-10-CM | POA: Diagnosis not present

## 2022-11-26 LAB — CBC WITH DIFFERENTIAL/PLATELET
Abs Immature Granulocytes: 0.03 10*3/uL (ref 0.00–0.07)
Basophils Absolute: 0 10*3/uL (ref 0.0–0.1)
Basophils Relative: 1 %
Eosinophils Absolute: 0.6 10*3/uL — ABNORMAL HIGH (ref 0.0–0.5)
Eosinophils Relative: 8 %
HCT: 28 % — ABNORMAL LOW (ref 39.0–52.0)
Hemoglobin: 8.8 g/dL — ABNORMAL LOW (ref 13.0–17.0)
Immature Granulocytes: 0 %
Lymphocytes Relative: 21 %
Lymphs Abs: 1.6 10*3/uL (ref 0.7–4.0)
MCH: 28.9 pg (ref 26.0–34.0)
MCHC: 31.4 g/dL (ref 30.0–36.0)
MCV: 91.8 fL (ref 80.0–100.0)
Monocytes Absolute: 0.8 10*3/uL (ref 0.1–1.0)
Monocytes Relative: 10 %
Neutro Abs: 4.8 10*3/uL (ref 1.7–7.7)
Neutrophils Relative %: 60 %
Platelets: 198 10*3/uL (ref 150–400)
RBC: 3.05 MIL/uL — ABNORMAL LOW (ref 4.22–5.81)
RDW: 14.3 % (ref 11.5–15.5)
WBC: 7.9 10*3/uL (ref 4.0–10.5)
nRBC: 0 % (ref 0.0–0.2)

## 2022-11-26 LAB — COMPREHENSIVE METABOLIC PANEL
ALT: 13 U/L (ref 0–44)
AST: 14 U/L — ABNORMAL LOW (ref 15–41)
Albumin: 2.7 g/dL — ABNORMAL LOW (ref 3.5–5.0)
Alkaline Phosphatase: 66 U/L (ref 38–126)
Anion gap: 8 (ref 5–15)
BUN: 90 mg/dL — ABNORMAL HIGH (ref 8–23)
CO2: 19 mmol/L — ABNORMAL LOW (ref 22–32)
Calcium: 9.2 mg/dL (ref 8.9–10.3)
Chloride: 113 mmol/L — ABNORMAL HIGH (ref 98–111)
Creatinine, Ser: 2.98 mg/dL — ABNORMAL HIGH (ref 0.61–1.24)
GFR, Estimated: 22 mL/min — ABNORMAL LOW (ref >60)
Glucose, Bld: 111 mg/dL — ABNORMAL HIGH (ref 70–99)
Potassium: 5.3 mmol/L — ABNORMAL HIGH (ref 3.5–5.1)
Sodium: 140 mmol/L (ref 135–145)
Total Bilirubin: 0.3 mg/dL (ref 0.3–1.2)
Total Protein: 5.7 g/dL — ABNORMAL LOW (ref 6.5–8.1)

## 2022-11-26 LAB — MAGNESIUM: Magnesium: 1.9 mg/dL (ref 1.7–2.4)

## 2022-11-26 MED ORDER — SODIUM CHLORIDE 0.45 % IV SOLN: INTRAVENOUS | Status: AC

## 2022-11-26 MED ORDER — SODIUM ZIRCONIUM CYCLOSILICATE 10 G PO PACK
10.0000 g | PACK | Freq: Once | ORAL | Status: AC
  Administered 2022-11-26: 10 g via ORAL
  Filled 2022-11-26: qty 1

## 2022-11-26 MED ORDER — SODIUM BICARBONATE 650 MG PO TABS
650.0000 mg | ORAL_TABLET | Freq: Two times a day (BID) | ORAL | Status: DC
  Administered 2022-11-26 – 2022-11-28 (×5): 650 mg via ORAL
  Filled 2022-11-26 (×5): qty 1

## 2022-11-26 NOTE — Progress Notes (Signed)
PROGRESS NOTE  Darrell [name] [id number] DOB: 1951/07/10   PCP: [name], MD  Patient is from: Home.  Totally dependent for ADLs.  DOA: 11/24/2022 LOS: 1  Chief complaints Chief Complaint  Patient presents with   Aphasia   Altered Mental Status     Brief Narrative / Interim history: 72 year old M with PMH of CKD-4, chronic indwelling Foley due to neurogenic bladder, prior CVA, primary lateral sclerosis, dysphagia 1 on pured diet and seizure disorder presenting with generalized weakness and lethargy, and admitted for AKI on CKD-4 and dehydration.  Reportedly had poor p.o. intake although drinking fluids well.  Per wife, poor p.o. intake although drinking fluids well.  Also on torsemide "for kidney".  Takes ibuprofen daily.  In ED, stable vitals.  Cr 3.5 (baseline 2.2-2.4).  BUN 101.  K5.2.  Hgb 9.9.  Lactic acid negative.  UA with large LE, negative nitrite and rare bacteria.  CT renal stone study shows changes suspicious for stercoral colitis the rectum but is stable from prior exam.  Patient was started on IV fluid and admitted.  Renal function improving with IV fluid.  Subjective: Seen and examined earlier this morning.  No major events overnight of this morning.  No complaints.  Sleepy but wakes to voice.  Patient's wife at bedside.  Creatinine improved.  Objective: Vitals:   11/25/22 2053 11/26/22 0029 11/26/22 0426 11/26/22 0843  BP: 132/75 133/74 135/76 (!) 142/83  Pulse: 63 61 63 62  Resp: '18 18 18 16  '$ Temp: 97.8 F (36.6 C) 97.9 F (36.6 C) 97.9 F (36.6 C) 98.1 F (36.7 C)  TempSrc: Oral Oral Oral Oral  SpO2: 100% 100% 100% 100%    Examination:  GENERAL: Appears frail.  No apparent distress. HEENT: MMM.  Vision and hearing grossly intact.  NECK: Supple.  No apparent JVD.  RESP:  No IWOB.  Fair aeration bilaterally. CVS:  RRR. Heart sounds normal.  ABD/GI/GU: BS+. Abd soft, NTND.  Indwelling Foley catheter. MSK/EXT:  Moves RUE very well.  LUE  contractures.  Does not move BLE.  Significant muscle mass and subcu fat loss. SKIN: Small skin abrasion/wound over left leg. NEURO: Sleepy but wakes to voice.  Oriented to self, person, city and state.  PSYCH: Calm. Normal affect.   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 intake, diuretics and NSAID use.  No obstruction on CT renal.  Seems to have good urine output.  Urine culture as above.  Unclear if this is colonization or true UTI.  Recent Labs    09/07/22 0256 09/08/22 0707 09/09/22 0225 09/10/22 0209 09/11/22 0343 09/12/22 0652 09/13/22 0218 11/24/22 2338 11/25/22 1137 11/26/22 0725  BUN 91* 108* 88* 65* 49* 38* 33* 101* 93* 90*  CREATININE 3.56* 3.80* 3.15* 2.61* 2.38* 2.22* 2.22* 3.54* 3.14* 2.98*  -Foley catheter exchanged  in ED. -Continue 1/2 NS at 100 cc an hour -P.o. sodium chloride for metabolic acidosis -Patient is not a candidate for dialysis.  Anemia of chronic disease: Hgb higher than baseline.  Likely hemoconcentration.  Anemia panel suggests ACD. Recent Labs    09/09/22 0225 09/09/22 0230 09/09/22 2134 09/10/22 0209 09/11/22 0343 09/12/22 0652 09/13/22 0218 11/24/22 2338 11/25/22 1137 11/26/22 0725  HGB 7.2* 7.2* 8.6* 8.6* 8.0* 7.9* 8.1* 9.9* 9.1*  8.8*  -Monitor  Positive urine culture: Urine culture with Enterococcus faecalis and Pseudomonas aeruginosa.  It looks like sample was obtained from new catheter if done as ordered.  Patient has no fever, leukocytosis or suprapubic tenderness.  I suspect colonization. -Will have ID input before initiating antibiotics.  Primary lateral sclerosis: Patient with paresis and contractures as above.  [facility] with [facility].  Totally dependent for ADLs.  Wheelchair dependent for mobility.  Wife and brother-in-law helps with transfers.  He is DNR and DNI which is appropriate. -May have palliative involved if renal function does not improve -PT/OT eval  Neurogenic bladder/BPH/chronic indwelling Foley catheter -Foley catheter exchanged in ED.   Possible stable stercoral colitis of rectum: Noted on CT renal stone study.  Wife reports regular bowel movements.  She says she had 2 bowel movements yesterday.  No diarrhea.  Abdominal exam benign. -Bowel regimen  Dysphagia - pureed diet, honey-thick liquids -Continue with pureed diet/honey thick liquids. Pt is a aspiration risk.   Seizure disorder ([place]) -Chronic. Continue keppra   AVB status PPM: Stable.    History of CVA with residual deficit-including LUE contracture and BLE paralysis.  History of DVT: Does not seem to be on anticoagulation anymore.  Pyuria: Likely due to chronic Foley versus UTI.  Hyperkalemia: -IVF as above -[place]    There is no height or weight on file to calculate BMI.   Pressure skin injury: POA.  Noted small clean looking wound below his knee laterally. -Wound care consult  DVT prophylaxis:  heparin injection 5,000 Units Start: 11/25/22 0600 SCDs Start: 11/25/22 0441  Code Status: DNR/DNI Family Communication: Updated patient's wife at bedside Level of care: Med-Surg Status is: Inpatient The patient will remain inpatient because: AKI on CKD-4   Final disposition: TBD Consultants:   None  35 minutes with more than 50% spent in reviewing records, counseling patient/family and coordinating care.   Sch Meds:  Scheduled Meds:  amLODipine  10 mg Oral Daily   atorvastatin  40 mg Oral Daily   [START ON 11/27/2022] azithromycin  250 mg Oral Once per day on Mon Wed Fri   brimonidine  1 drop Both Eyes Daily   dorzolamide-timolol  1 drop Both Eyes Daily   heparin  5,000 Units Subcutaneous Q8H   hydrALAZINE  25 mg Oral TID   latanoprost  1 drop Both Eyes QHS   levETIRAcetam  500 mg Oral BID   metoprolol succinate  50 mg Oral QHS   montelukast  10 mg Oral QHS   pantoprazole  40 mg Oral Daily   polyethylene glycol  34 g Oral Daily   sodium bicarbonate  650 mg Oral BID   Continuous Infusions:   PRN Meds:.acetaminophen **OR** acetaminophen, ondansetron **OR** ondansetron (ZOFRAN) IV, [COMPLETED] senna-docusate **FOLLOWED BY** senna-docusate  Antimicrobials: Anti-infectives (From admission, onward)    Start     Dose/Rate Route Frequency Ordered Stop   11/27/22 0900  azithromycin (ZITHROMAX) tablet 250 mg       Note to Pharmacy: Take on Mon, Wed,  Fri     250 mg Oral Once per day on Mon Wed Fri 11/25/22 0440     11/25/22 0130  cefTRIAXone (ROCEPHIN) 1 g in sodium chloride 0.9 % 100 mL IVPB        1 g 200 mL/hr over 30 Minutes Intravenous  Once 11/25/22 0124 11/25/22 0212        I have personally reviewed the following labs and images: CBC: Recent Labs  Lab 11/24/22 2338 11/25/22 1137 11/26/22 0725  WBC 9.1 8.0 7.9  NEUTROABS 5.9  --  4.8  HGB 9.9* 9.1* 8.8*  HCT 30.9* 29.3* 28.0*  MCV 93.4 93.0 91.8  PLT 187 190 198   BMP &GFR Recent Labs  Lab 11/24/22 2338 11/25/22 1137 11/26/22 0725  NA 136 138 140  K 5.2* 4.7 5.3*  CL 108 111 113*  CO2 20* 20* 19*  GLUCOSE 120* 123* 111*  BUN 101* 93* 90*  CREATININE 3.54* 3.14* 2.98*  CALCIUM 9.8 9.4 9.2  MG  --  1.9 1.9  PHOS  --  4.2  --    CrCl cannot be calculated (Unknown ideal weight.). Liver &  Pancreas: Recent Labs  Lab 11/24/22 2338 11/25/22 1137 11/26/22 0725  AST 14*  --  14*  ALT 10  --  13  ALKPHOS 79  --  66  BILITOT 0.6  --  0.3  PROT 6.4*  --  5.7*  ALBUMIN 3.2* 2.9* 2.7*   No results for input(s): "LIPASE", "AMYLASE" in the last 168 hours. No results for input(s): "AMMONIA" in the last 168 hours. Diabetic: No results for input(s): "HGBA1C" in the last 72 hours. No results for input(s): "GLUCAP" in the last 168 hours. Cardiac Enzymes: No results for input(s): "CKTOTAL", "CKMB", "CKMBINDEX", "TROPONINI" in the last 168 hours. No results for input(s): "PROBNP" in the last 8760 hours. Coagulation Profile: Recent Labs  Lab 11/24/22 2338  INR 1.2   Thyroid Function Tests: No results for input(s): "TSH", "T4TOTAL", "FREET4", "T3FREE", "THYROIDAB" in the last 72 hours. Lipid Profile: No results for input(s): "CHOL", "HDL", "LDLCALC", "TRIG", "CHOLHDL", "LDLDIRECT" in the last 72 hours. Anemia Panel: Recent Labs    11/25/22 1137  VITAMINB12 832  FOLATE 7.5  FERRITIN 152  TIBC 209*  IRON 32*  RETICCTPCT 2.1   Urine analysis:    Component Value Date/Time   COLORURINE YELLOW 11/25/2022 0005   APPEARANCEUR HAZY (A) 11/25/2022 0005   LABSPEC 1.011 11/25/2022 0005   PHURINE 5.0 11/25/2022 0005   GLUCOSEU NEGATIVE 11/25/2022 0005   HGBUR NEGATIVE 11/25/2022 0005   BILIRUBINUR NEGATIVE 11/25/2022 0005   BILIRUBINUR negative 09/07/2017 1645   KETONESUR NEGATIVE 11/25/2022 0005   PROTEINUR NEGATIVE 11/25/2022 0005   UROBILINOGEN 0.2 09/07/2017 1645   UROBILINOGEN 4.0 (H) 08/04/2016 1517   NITRITE NEGATIVE 11/25/2022 0005   LEUKOCYTESUR LARGE (A) 11/25/2022 0005   Sepsis Labs: Invalid input(s): "PROCALCITONIN", "LACTICIDVEN"  Microbiology: Recent Results (from the past 240 hour(s))  Remove and replace urinary cath (placed > 5 days) then obtain urine culture from new indwelling urinary catheter.     Status: Abnormal (Preliminary result)   Collection  Time: 11/25/22 12:05 AM   Specimen: Urine, Catheterized  Result Value Ref Range Status   Specimen Description URINE, CATHETERIZED  Final   Special Requests NONE  Final   Culture (A)  Final    >=100,000 COLONIES/mL ENTEROCOCCUS FAECALIS >=100,000 COLONIES/mL PSEUDOMONAS AERUGINOSA SUSCEPTIBILITIES TO FOLLOW Performed at [facility], [address], [place], [place] [address]  Report Status PENDING  Incomplete    Radiology Studies: No results found.    Alissia Lory T. [place]  If 7PM-7AM, please contact night-coverage [email or web address] 11/26/2022, 11:50 AM

## 2022-11-26 NOTE — Consult Note (Signed)
[facility]       Reason for Consult:positive urine culture    Referring Physician: Dr. Cyndia Skeeters  Principal Problem:   Acute renal failure superimposed on stage 4 chronic kidney disease ([place]) Active Problems:   Essential hypertension   History of CVA with residual deficit   Heart block   Status cardiac pacemaker   Seizure disorder ([place])   Dysphagia   ALS (amyotrophic lateral sclerosis) ([name])   Primary lateral sclerosis ([name])   Anemia, chronic disease   DNR (do not resuscitate)   Chronic indwelling Foley catheter   Acute renal failure superimposed on stage 4 chronic kidney disease, unspecified acute renal failure type ([name])    amLODipine  10 mg Oral Daily   atorvastatin  40 mg Oral Daily   [START ON 11/27/2022] azithromycin  250 mg Oral Once per day on Mon Wed Fri   brimonidine  1 drop Both Eyes Daily   dorzolamide-timolol  1 drop Both Eyes Daily   heparin  5,000 Units Subcutaneous Q8H   hydrALAZINE  25 mg Oral TID   latanoprost  1 drop Both Eyes QHS   levETIRAcetam  500 mg Oral BID   metoprolol succinate  50 mg Oral QHS   montelukast  10 mg Oral QHS   pantoprazole  40 mg Oral Daily   polyethylene glycol  34 g Oral Daily   sodium bicarbonate  650 mg Oral BID    Recommendations: No indication for antibiotics  Assessment:  He has a chronic foley and a positive urine culture with no symptoms of infection c/w asymptomatic bacteruria.  No indication for antibiotics.   Discussed with the wife.  Decreased alertness secondary to uremia.  Getting hydration per the primary team.   HPI: Darrell [name] is a 72 y.o. male s/p CVA, chronic foley due to neurogenic bladder and came in with weakness and less alertness.  Noted to have prerenal azotemia with a BUN of 120 on admission with acute on chronic renal failure.  He has no fever, no leukocytosis.  Urine culture sent for unclear reasons.  UA with some WBCs in the setting of a chronic foley.     Review  of Systems:  Unable to be assessed due to mental status   Past Medical History:  Diagnosis Date   Arthritis    Asthma    At high risk for falls 08/16/2015   CIDP (chronic inflammatory demyelinating polyneuropathy) ([name])    CKD (chronic kidney disease) stage 3, GFR 30-59 ml/min ([name]) 08/16/2015   Dysphagia as late effect of cerebrovascular disease    pts wife states pt has to eat soft foods    Elevated liver enzymes 08/10/2016   GERD (gastroesophageal reflux disease)    Glaucoma    High cholesterol    History of CVA with residual deficit 03/25/2013   Hypertension    Hypertensive retinopathy of both eyes 01/16/2017   Inguinal hernia 03/25/2013   Liver hemangioma 08/14/2016   New onset seizure ([place]) 07/08/2017   seizure 07/14/18   Nuclear sclerosis of both eyes 10/25/2016   Pneumonia    Presence of permanent cardiac pacemaker    Primary open angle glaucoma of both eyes, indeterminate stage 10/25/2016   Renal mass, right 08/14/2016   Status cardiac pacemaker 01/29/2017   Placed for second degree heart block on 01/16/17 Med[employer]onic Azure XT DR MRI SureScan dual-chamber pacemaker   Stroke [name])    2011 with residual deficit left sided weakness  Tobacco dependence     Social History   Tobacco Use   Smoking status: Some Days    Packs/day: 0.25    Years: 40.00    Total pack years: 10.00    Types: Cigarettes   Smokeless tobacco: Never   Tobacco comments:    About 2-3 cigarettes every other day.  Vaping Use   Vaping Use: Never used  Substance Use Topics   Alcohol use: No    Comment: alcohol free for 1 year was drinking 1/2 pint per day    Drug use: No    Family History  Problem Relation Age of Onset   Hypertension Mother    Diabetes Sister    Hypertension Sister    Cancer Sister        1 sister   COPD Sister        in 1 sister   Stomach cancer Neg Hx    Colon cancer Neg Hx    Pancreatic cancer Neg Hx    Esophageal cancer Neg Hx     Allergies  Allergen  Reactions   Ace Inhibitors Other (See Comments)    Hyperkalemia   Doxycycline Other (See Comments)    Hiccups, cough, nausea and emesis, elevated liver enzymes, elevated eosinophils, SOB concerning for early DRESS syndrome    Atacand Hct [Candesartan Cilexetil-Hctz] Hives   Shellfish Allergy Hives    Physical Exam: Constitutional: sleeping  Vitals:   11/26/22 0426 11/26/22 0843  BP: 135/76 (!) 142/83  Pulse: 63 62  Resp: 18 16  Temp: 97.9 F (36.6 C) 98.1 F (36.7 C)  SpO2: 100% 100%   EYES: anicteric Respiratory: normal respiratory effort  Lab Results  Component Value Date   WBC 7.9 11/26/2022   HGB 8.8 (L) 11/26/2022   HCT 28.0 (L) 11/26/2022   MCV 91.8 11/26/2022   PLT 198 11/26/2022    Lab Results  Component Value Date   CREATININE 2.98 (H) 11/26/2022   BUN 90 (H) 11/26/2022   NA 140 11/26/2022   K 5.3 (H) 11/26/2022   CL 113 (H) 11/26/2022   CO2 19 (L) 11/26/2022    Lab Results  Component Value Date   ALT 13 11/26/2022   AST 14 (L) 11/26/2022   ALKPHOS 66 11/26/2022     Microbiology: Recent Results (from the past 240 hour(s))  Remove and replace urinary cath (placed > 5 days) then obtain urine culture from new indwelling urinary catheter.     Status: Abnormal (Preliminary result)   Collection Time: 11/25/22 12:05 AM   Specimen: Urine, Catheterized  Result Value Ref Range Status   Specimen Description URINE, CATHETERIZED  Final   Special Requests NONE  Final   Culture (A)  Final    >=100,000 COLONIES/mL ENTEROCOCCUS FAECALIS >=100,000 COLONIES/mL PSEUDOMONAS AERUGINOSA SUSCEPTIBILITIES TO FOLLOW Performed at [facility], [address] [place], [place] [address]    Report Status PENDING  Incomplete    Thayer Headings, [facility] [place] [email or web address] 11/26/2022, 1:51 PM

## 2022-11-27 DIAGNOSIS — R8271 Bacteriuria: Secondary | ICD-10-CM | POA: Insufficient documentation

## 2022-11-27 DIAGNOSIS — N179 Acute kidney failure, unspecified: Secondary | ICD-10-CM | POA: Diagnosis not present

## 2022-11-27 DIAGNOSIS — I10 Essential (primary) hypertension: Secondary | ICD-10-CM | POA: Diagnosis not present

## 2022-11-27 DIAGNOSIS — I6930 Unspecified sequelae of cerebral infarction: Secondary | ICD-10-CM | POA: Diagnosis not present

## 2022-11-27 DIAGNOSIS — R8281 Pyuria: Secondary | ICD-10-CM

## 2022-11-27 DIAGNOSIS — Z978 Presence of other specified devices: Secondary | ICD-10-CM | POA: Diagnosis not present

## 2022-11-27 LAB — URINE CULTURE: Culture: >=100000 — AB

## 2022-11-27 LAB — RENAL FUNCTION PANEL
Albumin: 2.7 g/dL — ABNORMAL LOW (ref 3.5–5.0)
Anion gap: 9 (ref 5–15)
BUN: 78 mg/dL — ABNORMAL HIGH (ref 8–23)
CO2: 15 mmol/L — ABNORMAL LOW (ref 22–32)
Calcium: 8.9 mg/dL (ref 8.9–10.3)
Chloride: 114 mmol/L — ABNORMAL HIGH (ref 98–111)
Creatinine, Ser: 2.64 mg/dL — ABNORMAL HIGH (ref 0.61–1.24)
GFR, Estimated: 25 mL/min — ABNORMAL LOW (ref >60)
Glucose, Bld: 108 mg/dL — ABNORMAL HIGH (ref 70–99)
Phosphorus: 3.3 mg/dL (ref 2.5–4.6)
Potassium: 4.7 mmol/L (ref 3.5–5.1)
Sodium: 138 mmol/L (ref 135–145)

## 2022-11-27 LAB — GLUCOSE, CAPILLARY: Glucose-Capillary: 122 mg/dL — ABNORMAL HIGH (ref 70–99)

## 2022-11-27 NOTE — Progress Notes (Signed)
PROGRESS NOTE  Darrell [name] [id number] DOB: [birth year]   PCP: [name], MD  Patient is from: Home.  Totally dependent for ADLs.  Followed by palliative and ALS team from [facility].  DOA: 11/24/2022 LOS: 2  Chief complaints Chief Complaint  Patient presents with   Aphasia   Altered Mental Status     Brief Narrative / Interim history: 72 year old M with PMH of CKD-4, chronic indwelling Foley due to neurogenic bladder, prior CVA, primary lateral sclerosis, dysphagia 1 on pured diet and seizure disorder presenting with generalized weakness and lethargy, and admitted for AKI on CKD-4 and dehydration.  Reportedly had poor p.o. intake although drinking fluids well.  Per wife, poor p.o. intake although drinking fluids well.  Also on torsemide "for kidney".  Takes ibuprofen daily.  In ED, stable vitals.  Cr 3.5 (baseline 2.2-2.4).  BUN 101.  K5.2.  Hgb 9.9.  Lactic acid negative.  UA with large LE, negative nitrite and rare bacteria.  CT renal stone study shows changes suspicious for stercoral colitis the rectum but is stable from prior exam.  Patient was started on IV fluid and admitted.  Renal function improving with IV fluid.  Subjective: Seen and examined earlier this morning.  No major events overnight of this morning.  Patient's wife at bedside.  She reports some abdominal pain last night that has resolved with Tylenol.  Noted some swelling around his ankle and some puffiness in the face.  Currently denies pain or shortness of breath.  Objective: Vitals:   11/26/22 1836 11/26/22 2112 11/27/22 0447 11/27/22 0834  BP: 130/66 130/79 (!) 144/76 135/74  Pulse: 61 64 60 60  Resp: '18 18 18 16  '$ Temp: 98.3 F (36.8 C) 97.8 F (36.6 C) 98 F (36.7 C) 97.6 F (36.4 C)  TempSrc: Oral Axillary Oral Oral  SpO2: 99% 100% 100% 100%    Examination:   GENERAL: Appears frail.  No apparent distress. HEENT: MMM.  Vision and hearing grossly intact.  NECK: Supple.  No apparent JVD.  RESP:   No IWOB.  Fair aeration bilaterally. CVS:  RRR. Heart sounds normal.  ABD/GI/GU: BS+. Abd soft, NTND.  Indwelling Foley catheter. MSK/EXT:   Moves RUE very well.  LUE contractures.  Does not move BLE.  Significant muscle mass and subcu fat loss. SKIN: Small skin abrasion over left leg. NEURO: Sleepy but wakes to voice.  Not alert but oriented to self, person and place. PSYCH: Calm. Normal affect.   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 intake, diuretics and NSAID use.  No obstruction on CT renal.  Seems to have good urine output.  Urine culture as above.  Unclear if this is colonization or true UTI.  Recent Labs    09/08/22 0707 09/09/22 0225 09/10/22 0209 09/11/22 0343 09/12/22  [id number] 09/13/22 0218 11/24/22 2338 11/25/22 1137 11/26/22 0725 11/27/22 0713  BUN 108* 88* 65* 49* 38* 33* 101* 93* 90* 78*  CREATININE 3.80* 3.15* 2.61* 2.38* 2.22* 2.22* 3.54* 3.14* 2.98* 2.64*  -Foley catheter exchanged in ED. -Decrease IV fluid to 75 cc an hour. -Continue p.o. sodium chloride for metabolic acidosis -Patient is not a candidate for dialysis.  Anemia of chronic disease: Hgb higher than baseline.  Likely hemoconcentration.  Anemia panel suggests ACD. Recent  Labs    09/09/22 0225 09/09/22 0230 09/09/22 2134 09/10/22 0209 09/11/22 0343 09/12/22 0652 09/13/22 0218 11/24/22 2338 11/25/22 1137 11/26/22 0725  HGB 7.2* 7.2* 8.6* 8.6* 8.0* 7.9* 8.1* 9.9* 9.1* 8.8*  -Monitor  Bacteriuria with pyuria: Urine culture with Enterococcus faecalis and Pseudomonas aeruginosa. Patient has no fever, leukocytosis or suprapubic tenderness.  Discussed with ID.  No indication for antibiotics.  Primary lateral sclerosis: Patient with paresis and contractures as above.  Followed by ALS team from [facility].  Followed by palliative at home.  Totally dependent for ADLs.  Wheelchair dependent for mobility.  Wife and brother-in-law helps with transfers.  He is DNR and DNI which is appropriate. -Supportive care.  Neurogenic bladder/BPH/chronic indwelling Foley catheter -Foley catheter exchanged in ED.   Possible stable stercoral colitis of rectum: Noted on CT renal stone study.  Wife reports regular bowel movements. No diarrhea.  Abdominal exam benign. -Bowel regimen  Acute metabolic encephalopathy: Likely uremia and underlying ALS.  Improving. -Management as above -Reorientation and delirium precaution -Minimize or minimize or avoid sedating medications.  Dysphagia - pureed diet, honey-thick liquids -Continue with pureed diet/honey thick liquids. Pt is a aspiration risk.   Seizure disorder ([place]) -Chronic. Continue keppra   AVB status PPM: Stable.    History of CVA with residual deficit-including LUE contracture and BLE paralysis.  History of DVT: Does not seem to be on anticoagulation anymore.  Pyuria: Likely due to chronic Foley versus UTI.  Hyperkalemia: resolved -IVF as above    There is no height or weight on file to calculate BMI.   Pressure skin injury: POA.  Noted small clean looking wound below his knee laterally. -Wound care consult  DVT prophylaxis:  heparin injection 5,000 Units Start: 11/25/22 0600 SCDs Start: 11/25/22 0441  Code  Status: DNR/DNI Family Communication: Updated patient's wife at bedside Level of care: Med-Surg Status is: Inpatient The patient will remain inpatient because: AKI on CKD-4   Final disposition: TBD Consultants:  None  35 minutes with more than 50% spent in reviewing records, counseling patient/family and coordinating care.   Sch Meds:  Scheduled Meds:  amLODipine  10 mg Oral Daily   atorvastatin  40 mg Oral Daily   azithromycin  250 mg Oral Once per day on Mon Wed Fri   brimonidine  1 drop Both Eyes Daily   dorzolamide-timolol  1 drop Both Eyes Daily   heparin  5,000 Units Subcutaneous Q8H   hydrALAZINE  25 mg Oral TID   latanoprost  1 drop Both Eyes QHS   levETIRAcetam  500 mg Oral BID   metoprolol succinate  50 mg Oral QHS   montelukast  10 mg Oral QHS   pantoprazole  40 mg Oral Daily   polyethylene glycol  34 g Oral Daily   sodium bicarbonate  650 mg Oral BID   Continuous Infusions:  sodium chloride 75 mL/hr at 11/27/22 1014    PRN Meds:.acetaminophen **OR** acetaminophen, ondansetron **OR** ondansetron (ZOFRAN) IV, [COMPLETED] senna-docusate **FOLLOWED BY** senna-docusate  Antimicrobials: Anti-infectives (  From admission, onward)    Start     Dose/Rate Route Frequency Ordered Stop   11/27/22 0900  azithromycin (ZITHROMAX) tablet 250 mg       Note to Pharmacy: Take on Mon, Wed, Fri     250 mg Oral Once per day on Mon Wed Fri 11/25/22 0440     11/25/22 0130  cefTRIAXone (ROCEPHIN) 1 g in sodium chloride 0.9 % 100 mL IVPB        1 g 200 mL/hr over 30 Minutes Intravenous  Once 11/25/22 0124 11/25/22 0212        I have personally reviewed the following labs and images: CBC: Recent Labs  Lab 11/24/22 2338 11/25/22 1137 11/26/22 0725  WBC 9.1 8.0 7.9  NEUTROABS 5.9  --  4.8  HGB 9.9* 9.1* 8.8*  HCT 30.9* 29.3* 28.0*  MCV 93.4 93.0 91.8  PLT 187 190 198   BMP &GFR Recent Labs  Lab 11/24/22 2338 11/25/22 1137 11/26/22 0725 11/27/22 0713  NA 136 138  140 138  K 5.2* 4.7 5.3* 4.7  CL 108 111 113* 114*  CO2 20* 20* 19* 15*  GLUCOSE 120* 123* 111* 108*  BUN 101* 93* 90* 78*  CREATININE 3.54* 3.14* 2.98* 2.64*  CALCIUM 9.8 9.4 9.2 8.9  MG  --  1.9 1.9  --   PHOS  --  4.2  --  3.3   CrCl cannot be calculated (Unknown ideal weight.). Liver & Pancreas: Recent Labs  Lab 11/24/22 2338 11/25/22 1137 11/26/22 0725 11/27/22 0713  AST 14*  --  14*  --   ALT 10  --  13  --   ALKPHOS 79  --  66  --   BILITOT 0.6  --  0.3  --   PROT 6.4*  --  5.7*  --   ALBUMIN 3.2* 2.9* 2.7* 2.7*   No results for input(s): "LIPASE", "AMYLASE" in the last 168 hours. No results for input(s): "AMMONIA" in the last 168 hours. Diabetic: No results for input(s): "HGBA1C" in the last 72 hours. Recent Labs  Lab 11/27/22 1119  GLUCAP 122*   Cardiac Enzymes: No results for input(s): "CKTOTAL", "CKMB", "CKMBINDEX", "TROPONINI" in the last 168 hours. No results for input(s): "PROBNP" in the last 8760 hours. Coagulation Profile: Recent Labs  Lab 11/24/22 2338  INR 1.2   Thyroid Function Tests: No results for input(s): "TSH", "T4TOTAL", "FREET4", "T3FREE", "THYROIDAB" in the last 72 hours. Lipid Profile: No results for input(s): "CHOL", "HDL", "LDLCALC", "TRIG", "CHOLHDL", "LDLDIRECT" in the last 72 hours. Anemia Panel: Recent Labs    11/25/22 1137  VITAMINB12 832  FOLATE 7.5  FERRITIN 152  TIBC 209*  IRON 32*  RETICCTPCT 2.1   Urine analysis:    Component Value Date/Time   COLORURINE YELLOW 11/25/2022 0005   APPEARANCEUR HAZY (A) 11/25/2022 0005   LABSPEC 1.011 11/25/2022 0005   PHURINE 5.0 11/25/2022 0005   GLUCOSEU NEGATIVE 11/25/2022 0005   HGBUR NEGATIVE 11/25/2022 0005   BILIRUBINUR NEGATIVE 11/25/2022 0005   BILIRUBINUR negative 09/07/2017 1645   KETONESUR NEGATIVE 11/25/2022 0005   PROTEINUR NEGATIVE 11/25/2022 0005   UROBILINOGEN 0.2 09/07/2017 1645   UROBILINOGEN 4.0 (H) 08/04/2016 1517   NITRITE NEGATIVE 11/25/2022 0005    LEUKOCYTESUR LARGE (A) 11/25/2022 0005   Sepsis Labs: Invalid input(s): "PROCALCITONIN", "LACTICIDVEN"  Microbiology: Recent Results (from the past 240 hour(s))  Remove and replace urinary cath (placed > 5 days) then obtain urine culture from new indwelling urinary catheter.  Status: Abnormal   Collection Time: 11/25/22 12:05 AM   Specimen: Urine, Catheterized  Result Value Ref Range Status   Specimen Description URINE, CATHETERIZED  Final   Special Requests   Final    NONE Performed at [facility], [address], [place], [place] [address]    Culture (A)  Final    >=100,000 COLONIES/mL ENTEROCOCCUS FAECALIS >=100,000 COLONIES/mL PSEUDOMONAS AERUGINOSA    Report Status 11/27/2022 FINAL  Final   Organism ID, Bacteria ENTEROCOCCUS FAECALIS (A)  Final   Organism ID, Bacteria PSEUDOMONAS AERUGINOSA (A)  Final      Susceptibility   Enterococcus faecalis - MIC*    AMPICILLIN <=2 SENSITIVE Sensitive     NITROFURANTOIN <=16 SENSITIVE Sensitive     VANCOMYCIN 1 SENSITIVE Sensitive     * >=100,000 COLONIES/mL ENTEROCOCCUS FAECALIS   Pseudomonas aeruginosa - MIC*    CEFTAZIDIME 4 SENSITIVE Sensitive     CIPROFLOXACIN >=4 RESISTANT Resistant     GENTAMICIN 8 INTERMEDIATE Intermediate     IMIPENEM 2 SENSITIVE Sensitive     * >=100,000 COLONIES/mL PSEUDOMONAS AERUGINOSA    Radiology Studies: No results found.    Juquan Reznick T. [place]  If 7PM-7AM, please contact night-coverage [email or web address] 11/27/2022, 11:42 AM

## 2022-11-27 NOTE — TOC Initial Note (Signed)
Transition of Care [facility]) - Initial/Assessment Note    Patient Details  Name: Darrell [name] MRN: [id number] Date of Birth: 19-Apr-1951  [facility] CM/SW Contact:    Tom-Johnson, Renea Ee, RN Phone Number: 11/27/2022, 2:08 PM  Clinical Narrative:                  CM spoke with patient's wife, [name] at bedside about needs for post hospital transition. Patient A&O to self at this time.  Admitted for ARF on CKD 4, found to have Uremia/positive Blood Cx. ID following. On Oral Zithromax.  Has hx of Stroke, Primary Lateral Sclerosis, Dysphagia Seizure Disorder, Neurogenic Bladder with chronic indwelling Foley and has a Pacemaker. Patient is followed by the ALS team at [facility]. Patient is also followed with Outpatient Palliative by [facility].  From home with wife who is patient's primary caregiver. Has a daughter that assists with his care. Wife's brother and other family members assists as needed. [name] states patient is bedbound and does not go out to MD visits any longer, has home visits by [facility] and [name], MD is his PCP. Uses [facility] pharmacy on [address]. Has a hospital bed, hoyer lift, w/c shower seat, home O2, CPAP, Neb machine and walker at home. Currently active with Home Health disciplines with [place] and will resume care at discharge.  Kelly notified of patient's admission.  No TOC needs noted at this time. CM will continue to follow as patient progresses with care towards discharge.         Expected Discharge Plan: Home/Self Care Barriers to Discharge: Continued Medical Work up   Patient Goals and CMS Choice Patient states their goals for this hospitalization and ongoing recovery are:: To return home CMS Medicare.gov Compare Post Acute Care list provided to:: Patient Choice offered to / list presented to : Patient, Spouse      Expected Discharge Plan and Services   Discharge Planning Services: CM Consult Post Acute Care Choice:  NA Living arrangements for the past 2 months: Single Family Home                 DME Arranged: N/A DME Agency: NA       HH Arranged: PT, OT, RN (Resumption of care) [place] Agency: [place] Date [place]: 11/27/22 Time HH Agency Contacted: 1400 Representative spoke with at [place]: Claiborne Billings  Prior Living Arrangements/Services Living arrangements for the past 2 months: [place] with:: Spouse Patient language and need for interpreter reviewed:: Yes Do you feel safe going back to the place where you live?: Yes      Need for Family Participation in Patient Care: Yes (Comment) Care giver support system in place?: Yes (comment) Current home services: DME (Hosp bed, hoyer lift, w/c, walker, shower seat, home O2, CPAP.) Criminal Activity/Legal Involvement Pertinent to Current Situation/Hospitalization: No - Comment as needed  Activities of Daily Living      Permission Sought/Granted Permission sought to share information with : Case Manager, Customer service manager, Family Supports Permission granted to share information with : Yes, Verbal Permission Granted              Emotional Assessment Appearance:: Appears stated age Attitude/Demeanor/Rapport: Other (comment) Affect (typically observed): Other (comment) Orientation: : Oriented to Self Alcohol / Substance Use: Not Applicable Psych Involvement: No (comment)  Admission diagnosis:  AKI (acute kidney injury) ([place]) [N17.9] Acute renal failure superimposed on stage 4 chronic kidney disease ([place]) [N17.9, N18.4] Acute  renal failure superimposed on stage 4 chronic kidney disease, unspecified acute renal failure type ([place]) [N17.9, N18.4] Patient Active Problem List   Diagnosis Date Noted   Bacteriuria with pyuria 11/27/2022   Acute renal failure superimposed on stage 4 chronic kidney disease, unspecified acute renal failure type ([place]) 11/25/2022   Pressure injury of skin 09/08/2022    Chronic indwelling Foley catheter 08/11/2022   Chronic anticoagulation 08/11/2022   Urinary retention 07/06/2022   Catheter-associated urinary tract infection ([place]) 07/06/2022   Chronic deep vein thrombosis (DVT) ([place]) 05/17/2022   DNR (do not resuscitate) 04/13/2022   Normocytic anemia 10/07/2021   Bigeminal rhythm 10/07/2021   Centrilobular emphysema ([place]) 05/23/2021   Anemia, chronic disease 05/06/2021   Benign prostatic hyperplasia 05/06/2021   Ulcer of ankle ([place]) 03/29/2021   Cognitive communication deficit 01/10/2021   Chronic bronchitis ([place]) 05/25/2020   Chronic cough 05/25/2020   Goals of care, counseling/discussion    Palliative care by specialist    Acute renal failure superimposed on stage 4 chronic kidney disease ([place]) 05/05/2019   Pain due to onychomycosis of toenails of both feet 03/25/2019   Primary lateral sclerosis ([place]) 08/28/2018   ALS (amyotrophic lateral sclerosis) ([place]) 07/24/2018   Dysarthria 05/21/2018   Dysphagia 05/21/2018   Gait abnormality 05/21/2018   Spasticity 05/21/2018   GERD (gastroesophageal reflux disease) 07/21/2017   Mixed hyperlipidemia 07/21/2017   Seizure disorder ([place]) 07/08/2017   Status cardiac pacemaker 01/29/2017   Unintended weight loss 01/29/2017   Tinea pedis of right foot 01/29/2017   Hypertensive retinopathy of both eyes 01/16/2017   Hypercalcemia 01/15/2017   Heart block 01/15/2017   Nuclear sclerosis of both eyes 10/25/2016   Primary open angle glaucoma of both eyes, indeterminate stage 10/25/2016   Glaucoma 09/12/2016   Liver hemangioma 08/14/2016   Renal cyst 08/14/2016   Renal mass, right 08/14/2016   Elevated liver enzymes 08/10/2016   CKD (chronic kidney disease) stage 4, GFR 15-29 ml/min ([place]) 08/16/2015   At high risk for falls 08/16/2015   Subcutaneous nodules 08/16/2015   Prediabetes 10/07/2013   Inguinal hernia 03/25/2013   History of CVA with residual deficit 03/25/2013   Essential hypertension 10/09/2012    Tobacco abuse 10/09/2012   PCP:  [name], MD Pharmacy:   [facility] ([facility]), [place] [id number] [place] ([place]) [place] [address] Phone: [phone] Fax: [phone]     Social Determinants of Health (SDOH) Social History: SDOH Screenings   Food Insecurity: No Food Insecurity (07/10/2022)  Housing: Low Risk  (07/07/2022)  Transportation Needs: No Transportation Needs (07/20/2022)  Utilities: Not At Risk (07/07/2022)  Depression (PHQ2-9): Low Risk  (01/24/2022)  Tobacco Use: High Risk (11/06/2022)   SDOH Interventions:     Readmission Risk Interventions    05/19/2022    3:24 PM  Readmission Risk Prevention Plan  Transportation Screening Complete  Medication Review (RN Care Manager) Complete  HRI or [place] Complete  [place] Not Applicable

## 2022-11-28 DIAGNOSIS — K5641 Fecal impaction: Secondary | ICD-10-CM | POA: Diagnosis not present

## 2022-11-28 DIAGNOSIS — I10 Essential (primary) hypertension: Secondary | ICD-10-CM | POA: Diagnosis not present

## 2022-11-28 DIAGNOSIS — R8271 Bacteriuria: Secondary | ICD-10-CM | POA: Diagnosis not present

## 2022-11-28 DIAGNOSIS — Z978 Presence of other specified devices: Secondary | ICD-10-CM | POA: Diagnosis not present

## 2022-11-28 DIAGNOSIS — G1221 Amyotrophic lateral sclerosis: Secondary | ICD-10-CM | POA: Diagnosis not present

## 2022-11-28 DIAGNOSIS — G1223 Primary lateral sclerosis: Secondary | ICD-10-CM | POA: Diagnosis not present

## 2022-11-28 DIAGNOSIS — Z950 Presence of cardiac pacemaker: Secondary | ICD-10-CM | POA: Diagnosis not present

## 2022-11-28 DIAGNOSIS — G40909 Epilepsy, unspecified, not intractable, without status epilepticus: Secondary | ICD-10-CM | POA: Diagnosis not present

## 2022-11-28 DIAGNOSIS — Z743 Need for continuous supervision: Secondary | ICD-10-CM | POA: Diagnosis not present

## 2022-11-28 DIAGNOSIS — Z66 Do not resuscitate: Secondary | ICD-10-CM | POA: Diagnosis not present

## 2022-11-28 DIAGNOSIS — R531 Weakness: Secondary | ICD-10-CM | POA: Diagnosis not present

## 2022-11-28 DIAGNOSIS — I6930 Unspecified sequelae of cerebral infarction: Secondary | ICD-10-CM | POA: Diagnosis not present

## 2022-11-28 DIAGNOSIS — Z7401 Bed confinement status: Secondary | ICD-10-CM | POA: Diagnosis not present

## 2022-11-28 DIAGNOSIS — R1310 Dysphagia, unspecified: Secondary | ICD-10-CM | POA: Diagnosis not present

## 2022-11-28 DIAGNOSIS — N179 Acute kidney failure, unspecified: Secondary | ICD-10-CM | POA: Diagnosis not present

## 2022-11-28 LAB — RENAL FUNCTION PANEL
Albumin: 2.5 g/dL — ABNORMAL LOW (ref 3.5–5.0)
Anion gap: 4 — ABNORMAL LOW (ref 5–15)
BUN: 71 mg/dL — ABNORMAL HIGH (ref 8–23)
CO2: 19 mmol/L — ABNORMAL LOW (ref 22–32)
Calcium: 9 mg/dL (ref 8.9–10.3)
Chloride: 114 mmol/L — ABNORMAL HIGH (ref 98–111)
Creatinine, Ser: 2.54 mg/dL — ABNORMAL HIGH (ref 0.61–1.24)
GFR, Estimated: 26 mL/min — ABNORMAL LOW (ref >60)
Glucose, Bld: 96 mg/dL (ref 70–99)
Phosphorus: 2.8 mg/dL (ref 2.5–4.6)
Potassium: 4.8 mmol/L (ref 3.5–5.1)
Sodium: 137 mmol/L (ref 135–145)

## 2022-11-28 LAB — CBC
HCT: 27.2 % — ABNORMAL LOW (ref 39.0–52.0)
Hemoglobin: 8.7 g/dL — ABNORMAL LOW (ref 13.0–17.0)
MCH: 29.3 pg (ref 26.0–34.0)
MCHC: 32 g/dL (ref 30.0–36.0)
MCV: 91.6 fL (ref 80.0–100.0)
Platelets: 200 10*3/uL (ref 150–400)
RBC: 2.97 MIL/uL — ABNORMAL LOW (ref 4.22–5.81)
RDW: 14.4 % (ref 11.5–15.5)
WBC: 5.8 10*3/uL (ref 4.0–10.5)
nRBC: 0 % (ref 0.0–0.2)

## 2022-11-28 LAB — MAGNESIUM: Magnesium: 1.7 mg/dL (ref 1.7–2.4)

## 2022-11-28 MED ORDER — SENNOSIDES-DOCUSATE SODIUM 8.6-50 MG PO TABS: 2.0000 | ORAL_TABLET | Freq: Two times a day (BID) | ORAL | Status: DC | PRN

## 2022-11-28 MED ORDER — DM-GUAIFENESIN ER 30-600 MG PO TB12
1.0000 | ORAL_TABLET | Freq: Two times a day (BID) | ORAL | Status: DC | PRN
  Administered 2022-11-28: 1 via ORAL
  Filled 2022-11-28: qty 1

## 2022-11-28 MED ORDER — HYDROCOD POLI-CHLORPHE POLI ER 10-8 MG/5ML PO SUER: 5.0000 mL | Freq: Two times a day (BID) | ORAL | Status: DC | PRN

## 2022-11-28 MED ORDER — SODIUM BICARBONATE 650 MG PO TABS: 650.0000 mg | ORAL_TABLET | Freq: Two times a day (BID) | ORAL | 0 refills | Status: AC

## 2022-11-28 MED ORDER — CHLORHEXIDINE GLUCONATE CLOTH 2 % EX PADS
6.0000 | MEDICATED_PAD | Freq: Every day | CUTANEOUS | Status: DC
  Administered 2022-11-28: 6 via TOPICAL

## 2022-11-28 NOTE — Discharge Summary (Signed)
Physician Discharge Summary  Darrell [name] [id number] DOB: [birth year] DOA: 11/24/2022  PCP: [name], MD  Admit date: 11/24/2022 Discharge date: 11/28/2022 Admitted From: Home Disposition: Home Recommendations for Outpatient Follow-up:  Follow up with PCP in 1 to 2 weeks Continue palliative follow-up Check CBC and BMP in 1 week Please follow up on the following pending results: None  Home Health: patient is bedbound and does not go out to MD visits any longer, has home visits by [facility]  Equipment/Devices: Has a hospital bed, hoyer lift, w/c shower seat, home O2, CPAP, Neb machine and wheelchair at home.  Discharge Condition: Stable but guarded prognosis CODE STATUS: DNR/DNI  Follow-up Information     [facility] Follow up.   Specialty: Home Health Services Why: Call to resume care at discharge. Contact information: [place] [address] [phone]         [name], MD. Schedule an appointment as soon as possible for a visit in 1 week(s).   Specialty: Family Medicine Contact information: [address] [place] [address] [facility] course 72 year old M with PMH of CKD-4, chronic indwelling Foley due to neurogenic bladder, prior CVA, primary lateral sclerosis, dysphagia 1 on pured diet and seizure disorder presenting with generalized weakness and lethargy, and admitted for AKI on CKD-4 and dehydration.  Reportedly had poor p.o. intake although drinking fluids well.  Per wife, poor p.o. intake although drinking fluids well.  Also on torsemide "for kidney".  Takes ibuprofen daily.   In ED, stable vitals.  Cr 3.5 (baseline 2.2-2.4).  BUN 101.  K5.2.  Hgb 9.9.  Lactic acid negative.  UA with large LE, negative nitrite and rare bacteria.  CT renal stone study shows changes suspicious for stercoral colitis the rectum but is stable from prior exam.  Patient was started on IV  fluid and admitted.   Renal function improved with hypotonic IV fluid.  Discontinue torsemide and magnesium on discharge.  Advised to avoid NSAID.  Encourage good hydration.  Recheck CBC and BMP in 1 week.  See individual problem list below for more.   Problems addressed during this hospitalization Principal Problem:   Acute renal failure superimposed on stage 4 chronic kidney disease ([name]) Active Problems:   History of CVA with residual deficit   Status cardiac pacemaker   Seizure disorder ([place])   Dysphagia   Primary lateral sclerosis ([name])   DNR (do not resuscitate)   Chronic indwelling Foley catheter   Anemia, chronic disease   Essential hypertension   ALS (amyotrophic lateral sclerosis) ([name])   Heart block   Acute renal failure superimposed on stage 4 chronic kidney disease, unspecified acute renal failure type ([place])   Bacteriuria with pyuria   AKI on CKD-/possible uremia/metabolic acidosis: Likely due to poor p.o. intake, diuretics and NSAID use.  No obstruction on CT renal.  Seems to have good urine output.  Urine culture as above.  Felt to be colonization versus true infection. Recent Labs    09/09/22 0225 09/10/22 0209 09/11/22 0343 09/12/22 0652 09/13/22 0218 11/24/22 2338 11/25/22 1137 11/26/22 0725 11/27/22 0713 11/28/22 0700  BUN 88* 65* 49* 38* 33* 101* 93* 90* 78* 71*  CREATININE 3.15* 2.61* 2.38* 2.22* 2.22* 3.54* 3.14* 2.98* 2.64* 2.54*  -Foley catheter exchanged in ED. -Continue p.o. sodium chloride -Discontinue torsemide and magnesium -Advised to avoid NSAID -Check  BMP in 1 week   Anemia of chronic disease: At baseline.  Anemia panel suggests anemia of chronic disease. -Check CBC in 1 week  Bacteriuria with pyuria: Urine culture with Enterococcus faecalis and Pseudomonas aeruginosa. Patient has no fever, leukocytosis or suprapubic tenderness.  Discussed with ID.  No indication for antibiotics.   Primary lateral sclerosis: Patient with paresis and  contractures as above.  Followed by ALS team from [facility].  Followed by palliative at home.  Totally dependent for ADLs.  Wheelchair dependent for mobility.  Wife and brother-in-law helps with transfers.  He is DNR and DNI which is appropriate. -Supportive care.   Neurogenic bladder/BPH/chronic indwelling Foley catheter -Foley catheter exchanged in ED.   Possible stable stercoral colitis of rectum: Noted on CT renal stone study.  No diarrhea.  Abdominal exam benign.  Having regular bowel movements. -Bowel regimen   Acute metabolic encephalopathy: Likely uremia and underlying ALS.  Improved. -Management as above -Reorientation and delirium precaution -Minimize or minimize or avoid sedating medications.   Dysphagia - pureed diet, honey-thick liquids -Continue with pureed diet/honey thick liquids. Pt is a aspiration risk.   Seizure disorder ([place]) -Chronic. Continue keppra   AVB status PPM: Stable.    History of CVA with residual deficit-including LUE contracture and BLE paralysis.   History of DVT: Does not seem to be on anticoagulation anymore.  Hyperkalemia: resolved      Vital signs Vitals:   11/27/22 2146 11/28/22 0222 11/28/22 0540 11/28/22 0754  BP: 124/65  133/70 131/70  Pulse: 63  (!) 59 60  Temp: 98.7 F (37.1 C)  98.1 F (36.7 C) 98.4 F (36.9 C)  Resp: 18  18   Weight:  78.2 kg    SpO2: 98%  98% 99%  TempSrc: Oral  Oral Oral     Discharge exam  GENERAL: No apparent distress.  Nontoxic. HEENT: MMM.  Vision and hearing grossly intact.  NECK: Supple.  No apparent JVD.  RESP:  No IWOB.  Fair aeration bilaterally. CVS:  RRR. Heart sounds normal.  ABD/GI/GU: BS+. Abd soft, NTND.  MSK/EXT: LUE contractures.  BLE weakness.  Significant muscle mass and subcu fat loss. SKIN: no apparent skin lesion or wound NEURO: Sleepy but wakes to voice.  Oriented to self, wife, city and state.  LUE contractures.  BLE weakness. PSYCH: Calm. Normal affect.   Discharge  Instructions Discharge Instructions     Diet - low sodium heart healthy   Complete by: As directed    Dysphagia 1 diet   Discharge instructions   Complete by: As directed    It has been a pleasure taking care of you!  You were hospitalized due to acute kidney injury for which you have been treated with IV fluid.  Your kidney functions improved.  It is very important that you maintain good hydration.  We have stopped torsemide and magnesium.  Avoid any over-the-counter pain medication other than plain Tylenol.  Review your new medication list and the directions on your medications before you take them. Follow-up with your primary care doctor in 1 to 2 weeks or sooner if needed   Take care,   Increase activity slowly   Complete by: As directed       Allergies as of 11/28/2022       Reactions   Ace Inhibitors Other (See Comments)   Hyperkalemia   Doxycycline Other (See Comments)   Hiccups, cough, nausea and emesis, elevated liver enzymes, elevated eosinophils, SOB concerning for early DRESS  syndrome    Atacand Hct [candesartan Cilexetil-hctz] Hives   Shellfish Allergy Hives        Medication List     STOP taking these medications    cetirizine 10 MG tablet Commonly known as: ZYRTEC   dicyclomine 10 MG capsule Commonly known as: BENTYL   Docusate Sodium 100 MG capsule   magnesium citrate Soln   torsemide 20 MG tablet Commonly known as: DEMADEX       TAKE these medications    albuterol 108 (90 Base) MCG/ACT inhaler Commonly known as: VENTOLIN HFA Inhale 2 puffs into the lungs every 6 (six) hours as needed for up to 30 days for Wheezing.   amLODipine 10 MG tablet Commonly known as: NORVASC Take 1 tablet (10 mg total) by mouth daily.   atorvastatin 40 MG tablet Commonly known as: LIPITOR Take 1 tablet (40 mg total) by mouth daily.   azithromycin 250 MG tablet Commonly known as: ZITHROMAX Take 1 tablet (250 mg total) by mouth 3 (three) times a week. Take  on Mon, Wed, Fri   Breo Ellipta 200-25 MCG/ACT Aepb Generic drug: fluticasone furoate-vilanterol Inhale 2 puffs into the lungs daily as needed (for shortness of breath).   brimonidine 0.2 % ophthalmic solution Commonly known as: ALPHAGAN Place 1 drop into both eyes daily.   dorzolamide-timolol 2-0.5 % ophthalmic solution Commonly known as: COSOPT Place 1 drop into both eyes daily.   finasteride 5 MG tablet Commonly known as: PROSCAR Take 5 mg by mouth daily.   glycopyrrolate 1 MG tablet Commonly known as: ROBINUL Take 1 mg by mouth 2 (two) times daily.   hydrALAZINE 25 MG tablet Commonly known as: APRESOLINE Take 1 tablet (25 mg total) by mouth 3 (three) times daily.   latanoprost 0.005 % ophthalmic solution Commonly known as: XALATAN Place 1 drop into both eyes at bedtime.   levETIRAcetam 500 MG tablet Commonly known as: KEPPRA Take 1 tablet (500 mg total) by mouth 2 (two) times daily.   metoCLOPramide 5 MG tablet Commonly known as: REGLAN Take 1 tablet (5 mg total) by mouth 3 (three) times daily before meals. What changed:  when to take this reasons to take this   metoprolol succinate 50 MG 24 hr tablet Commonly known as: TOPROL-XL Take 1 tablet (50 mg total) by mouth at bedtime. Take with or immediately following a meal.   MiraLax 17 GM/SCOOP powder Generic drug: polyethylene glycol powder 1 scoop mixed with 8 ounces of fluid Orally Once a day   montelukast 10 MG tablet Commonly known as: SINGULAIR Take 1 tablet by mouth at bedtime.   Multi Vitamin Tabs Take 1 tablet by mouth daily.   pantoprazole 40 MG tablet Commonly known as: PROTONIX Take 1 tablet (40 mg total) by mouth 2 (two) times daily. What changed: when to take this   senna-docusate 8.6-50 MG tablet Commonly known as: Senokot-S Take 2 tablets by mouth 2 (two) times daily as needed for moderate constipation.   sodium bicarbonate 650 MG tablet Take 1 tablet (650 mg total) by mouth 2 (two)  times daily for 10 days.   tamsulosin 0.4 MG Caps capsule Commonly known as: FLOMAX Take 1 capsule (0.4 mg total) by mouth daily.        Consultations: Infectious disease  Procedures/Studies: Foley catheter exchanged   CT Renal Stone Study  Result Date: 11/25/2022 CLINICAL DATA:  Acute renal injury, possible obstruction EXAM: CT ABDOMEN AND PELVIS WITHOUT CONTRAST TECHNIQUE: Multidetector CT imaging of the abdomen and  pelvis was performed following the standard protocol without IV contrast. RADIATION DOSE REDUCTION: This exam was performed according to the departmental dose-optimization program which includes automated exposure control, adjustment of the mA and/or kV according to patient size and/or use of iterative reconstruction technique. COMPARISON:  09/11/2022 FINDINGS: Lower chest: Lung bases are free of acute infiltrate or sizable effusion. Hepatobiliary: No focal liver abnormality is seen. No gallstones, gallbladder wall thickening, or biliary dilatation. Pancreas: Unremarkable. No pancreatic ductal dilatation or surrounding inflammatory changes. Spleen: Normal in size without focal abnormality. Adrenals/Urinary Tract: Adrenal glands are within normal limits. Kidneys demonstrate multiple cystic lesions bilaterally some of which demonstrate calcifications within. These are stable in appearance from the prior exam. No further follow-up is recommended. No obstructive changes are noted. The bladder is decompressed by Foley catheter. Stomach/Bowel: Some retained fecal material is noted within the rectum which may represent some early impaction. Mild wall thickening is noted similar to that seen on prior exam suspicious again for stercoral colitis. No obstructive changes are seen. The appendix is within normal limits. Small bowel and stomach are unremarkable. Vascular/Lymphatic: Atherosclerotic calcifications are noted without aneurysmal dilatation. No significant lymphadenopathy is seen.  Reproductive: Prostate is unremarkable. Other: No abdominal wall hernia or abnormality. No abdominopelvic ascites. Musculoskeletal: Degenerative changes of lumbar spine are noted. IMPRESSION: No evidence of urinary tract obstructive changes. Changes suspicious for stercoral colitis the rectum. These findings are stable from the prior exam. Remainder of the study is stable from the previous exam. Electronically Signed   By: Inez Catalina M.D.   On: 11/25/2022 01:22   DG Chest Port 1 View  Result Date: 11/25/2022 CLINICAL DATA:  Decreased level of consciousness, aphasia EXAM: PORTABLE CHEST 1 VIEW COMPARISON:  09/06/2022 FINDINGS: Lungs are clear.  No pleural effusion or pneumothorax. The heart is normal in size.  Left subclavian pacemaker. IMPRESSION: No evidence of acute cardiopulmonary disease. Electronically Signed   By: Julian Hy M.D.   On: 11/25/2022 00:08       The results of significant diagnostics from this hospitalization (including imaging, microbiology, ancillary and laboratory) are listed below for reference.     Microbiology: Recent Results (from the past 240 hour(s))  Remove and replace urinary cath (placed > 5 days) then obtain urine culture from new indwelling urinary catheter.     Status: Abnormal   Collection Time: 11/25/22 12:05 AM   Specimen: Urine, Catheterized  Result Value Ref Range Status   Specimen Description URINE, CATHETERIZED  Final   Special Requests   Final    NONE Performed at [facility], [address] [place], [place] [address]    Culture (A)  Final    >=100,000 COLONIES/mL ENTEROCOCCUS FAECALIS >=100,000 COLONIES/mL PSEUDOMONAS AERUGINOSA    Report Status 11/27/2022 FINAL  Final   Organism ID, Bacteria ENTEROCOCCUS FAECALIS (A)  Final   Organism ID, Bacteria PSEUDOMONAS AERUGINOSA (A)  Final      Susceptibility   Enterococcus faecalis - MIC*    AMPICILLIN <=2 SENSITIVE Sensitive     NITROFURANTOIN <=16 SENSITIVE Sensitive      VANCOMYCIN 1 SENSITIVE Sensitive     * >=100,000 COLONIES/mL ENTEROCOCCUS FAECALIS   Pseudomonas aeruginosa - MIC*    CEFTAZIDIME 4 SENSITIVE Sensitive     CIPROFLOXACIN >=4 RESISTANT Resistant     GENTAMICIN 8 INTERMEDIATE Intermediate     IMIPENEM 2 SENSITIVE Sensitive     * >=100,000 COLONIES/mL PSEUDOMONAS AERUGINOSA     Labs:  CBC: Recent Labs  Lab  11/24/22 2338 11/25/22 1137 11/26/22 0725 11/28/22 0648  WBC 9.1 8.0 7.9 5.8  NEUTROABS 5.9  --  4.8  --   HGB 9.9* 9.1* 8.8* 8.7*  HCT 30.9* 29.3* 28.0* 27.2*  MCV 93.4 93.0 91.8 91.6  PLT 187 190 198 200   BMP &GFR Recent Labs  Lab 11/24/22 2338 11/25/22 1137 11/26/22 0725 11/27/22 0713 11/28/22 0648 11/28/22 0700  NA 136 138 140 138  --  137  K 5.2* 4.7 5.3* 4.7  --  4.8  CL 108 111 113* 114*  --  114*  CO2 20* 20* 19* 15*  --  19*  GLUCOSE 120* 123* 111* 108*  --  96  BUN 101* 93* 90* 78*  --  71*  CREATININE 3.54* 3.14* 2.98* 2.64*  --  2.54*  CALCIUM 9.8 9.4 9.2 8.9  --  9.0  MG  --  1.9 1.9  --  1.7  --   PHOS  --  4.2  --  3.3  --  2.8   Estimated Creatinine Clearance: 29.9 mL/min (A) (by C-G formula based on SCr of 2.54 mg/dL (H)). Liver & Pancreas: Recent Labs  Lab 11/24/22 2338 11/25/22 1137 11/26/22 0725 11/27/22 0713 11/28/22 0700  AST 14*  --  14*  --   --   ALT 10  --  13  --   --   ALKPHOS 79  --  66  --   --   BILITOT 0.6  --  0.3  --   --   PROT 6.4*  --  5.7*  --   --   ALBUMIN 3.2* 2.9* 2.7* 2.7* 2.5*   No results for input(s): "LIPASE", "AMYLASE" in the last 168 hours. No results for input(s): "AMMONIA" in the last 168 hours. Diabetic: No results for input(s): "HGBA1C" in the last 72 hours. Recent Labs  Lab 11/27/22 1119  GLUCAP 122*   Cardiac Enzymes: No results for input(s): "CKTOTAL", "CKMB", "CKMBINDEX", "TROPONINI" in the last 168 hours. No results for input(s): "PROBNP" in the last 8760 hours. Coagulation Profile: Recent Labs  Lab 11/24/22 2338  INR 1.2    Thyroid Function Tests: No results for input(s): "TSH", "T4TOTAL", "FREET4", "T3FREE", "THYROIDAB" in the last 72 hours. Lipid Profile: No results for input(s): "CHOL", "HDL", "LDLCALC", "TRIG", "CHOLHDL", "LDLDIRECT" in the last 72 hours. Anemia Panel: Recent Labs    11/25/22 1137  VITAMINB12 832  FOLATE 7.5  FERRITIN 152  TIBC 209*  IRON 32*  RETICCTPCT 2.1   Urine analysis:    Component Value Date/Time   COLORURINE YELLOW 11/25/2022 0005   APPEARANCEUR HAZY (A) 11/25/2022 0005   LABSPEC 1.011 11/25/2022 0005   PHURINE 5.0 11/25/2022 0005   GLUCOSEU NEGATIVE 11/25/2022 0005   HGBUR NEGATIVE 11/25/2022 0005   BILIRUBINUR NEGATIVE 11/25/2022 0005   BILIRUBINUR negative 09/07/2017 1645   KETONESUR NEGATIVE 11/25/2022 0005   PROTEINUR NEGATIVE 11/25/2022 0005   UROBILINOGEN 0.2 09/07/2017 1645   UROBILINOGEN 4.0 (H) 08/04/2016 1517   NITRITE NEGATIVE 11/25/2022 0005   LEUKOCYTESUR LARGE (A) 11/25/2022 0005   Sepsis Labs: Invalid input(s): "PROCALCITONIN", "LACTICIDVEN"   SIGNED:  Mercy Riding, MD  [facility] 11/28/2022, 11:14 AM

## 2022-11-28 NOTE — Progress Notes (Signed)
RN reviewed d/c paperwork with wife since pt will be transported home via [facility]. RN answered questions regarding time of meds and use of prn's. RN informed wife that if she needs to go home before pt arrives that all the d/c teaching has been completed.

## 2022-11-28 NOTE — Progress Notes (Signed)
According to patient's wife says he needs to be transported home via [facility].  Gomez Cleverly, SWOT RN

## 2022-11-28 NOTE — TOC Transition Note (Signed)
Transition of Care [facility]) - CM/SW Discharge Note   Patient Details  Name: Darrell [name] MRN: [id number] Date of Birth: [birth year]  [facility] of [facility]) CM/SW Contact:  Tom-Johnson, Renea Ee, RN Phone Number: 11/28/2022, 12:54 PM   Clinical Narrative:     Patient is scheduled for discharge today. Home Health and outpatient hospital f/u info on AVS. Discharge instructions on AVS. [facility] scheduled for transportation. No further TOC needs noted.        Final next level of care: Home w Home Health Services Barriers to Discharge: Barriers Resolved   Patient Goals and CMS Choice CMS Medicare.gov Compare Post Acute Care list provided to:: Patient Choice offered to / list presented to : Patient, Spouse  Discharge Placement                  Patient to be transferred to facility by: PTAR      Discharge Plan and Services Additional resources added to the After Visit Summary for     Discharge Planning Services: CM Consult Post Acute Care Choice: NA          DME Arranged: N/A DME Agency: NA       HH Arranged: PT, OT, RN (Resumption of care) [place] Agency: [place] Date [place]: 11/27/22 Time HH Agency Contacted: 1400 Representative spoke with at [place]: [place] Determinants of Health ([place]) Interventions SDOH Screenings   Food Insecurity: No Food Insecurity (07/10/2022)  Housing: Low Risk  (07/07/2022)  Transportation Needs: No Transportation Needs (07/20/2022)  Utilities: Not At Risk (07/07/2022)  Depression (PHQ2-9): Low Risk  (01/24/2022)  Tobacco Use: High Risk (11/06/2022)     Readmission Risk Interventions    05/19/2022    3:24 PM  Readmission Risk Prevention Plan  Transportation Screening Complete  Medication Review ([place]) Complete  HRI or [place] Not Applicable

## 2022-11-29 ENCOUNTER — Telehealth: Payer: Self-pay

## 2022-11-29 NOTE — Transitions of Care (Post Inpatient/ED Visit) (Signed)
   11/29/2022  Name: Darrell [name] MRN: [id number] DOB: 03/19/1951  Today's TOC FU Call Status: Today's TOC FU Call Status:: Successful TOC FU Call Competed TOC FU Call Complete Date: 11/29/22  Transition Care Management Follow-up Telephone Call Date of Discharge: 11/28/22 Discharge Facility: [facility]) Type of Discharge: Inpatient Admission Primary Inpatient Discharge Diagnosis:: "AKI" How have you been since you were released from the hospital?: Better (Call completed with spouse-she voices that patient is doing okay. Spouse confirms that patient is being followed by [facility] now and not [facility].) Any questions or concerns?: No  Items Reviewed: Did you receive and understand the discharge instructions provided?: Yes Medications obtained and verified?: Yes (Medications Reviewed) Any new allergies since your discharge?: No Dietary orders reviewed?: NA Do you have support at home?: Yes People in Home: spouse Name of Support/Comfort Primary Source: [facility] and Equipment/Supplies: [place] Ordered?: Yes Name of [place]:: [place] set up a time to come to your home?: Yes [place] Visit Date: 11/30/22 Any new equipment or medical supplies ordered?: No  Functional Questionnaire: Do you need assistance with bathing/showering or dressing?: Yes Do you need assistance with meal preparation?: Yes Do you need assistance with eating?: Yes Do you have difficulty maintaining continence: Yes Do you need assistance with getting out of bed/getting out of a chair/moving?: Yes Do you have difficulty managing or taking your medications?: Yes  Folllow up appointments reviewed: PCP Follow-up appointment confirmed?: No (Spouse will call [facility] today to advise them that patient is home and arrange appt) MD Provider Line [phone] Given: No [facility] Follow-up appointment confirmed?:  NA Do you need transportation to your follow-up appointment?: No Do you understand care options if your condition(s) worsen?: Yes-patient verbalized understanding  SDOH Interventions Today    Flowsheet Row Most Recent Value  SDOH Interventions   Food Insecurity Interventions Intervention Not Indicated  Transportation Interventions Intervention Not Indicated      TOC Interventions Today    Flowsheet Row Most Recent Value  TOC Interventions   TOC Interventions Discussed/Reviewed TOC Interventions Discussed       Interventions Today    Flowsheet Row Most Recent Value  Pharmacy Interventions   Pharmacy Dicussed/Reviewed Pharmacy Topics Discussed, Medications and their functions  Safety Interventions   Safety Discussed/Reviewed Safety Discussed       [employer] [facility] Care Management Care Management Community Coordinator Direct Phone: [phone] Toll Free: [phone] Fax: [phone]

## 2022-11-30 ENCOUNTER — Ambulatory Visit: Payer: Medicare HMO | Attending: Family Medicine

## 2022-11-30 VITALS — Ht 73.5 in | Wt 172.0 lb

## 2022-11-30 DIAGNOSIS — Z0000 Encounter for general adult medical examination without abnormal findings: Secondary | ICD-10-CM

## 2022-11-30 NOTE — Patient Instructions (Signed)
Darrell [name] , Thank you for taking time to come for your Medicare Wellness Visit. I appreciate your ongoing commitment to your health goals. Please review the following plan we discussed and let me know if I can assist you in the future.   These are the goals we discussed:  Goals      Blood Pressure < 130/80     Patient Stated     11/30/2022, wants him to feel better     Quit smoking / using tobacco        This is a list of the screening recommended for you and due dates:  Health Maintenance  Topic Date Due   COVID-19 Vaccine (1) Never done   Zoster (Shingles) Vaccine (1 of 2) Never done   Colon Cancer Screening  Never done   DTaP/Tdap/Td vaccine (2 - Td or Tdap) 11/24/2025   Pneumonia Vaccine  Completed   Flu Shot  Completed   Hepatitis C Screening: USPSTF Recommendation to screen - Ages 70-79 yo.  Completed   HPV Vaccine  Aged Out    Advanced directives: Advance directive discussed with you today.   Conditions/risks identified: none  Next appointment: Follow up in one year for your annual wellness visit.   Preventive Care 50 Years and Older, Male  Preventive care refers to lifestyle choices and visits with your health care provider that can promote health and wellness. What does preventive care include? A yearly physical exam. This is also called an annual well check. Dental exams once or twice a year. Routine eye exams. Ask your health care provider how often you should have your eyes checked. Personal lifestyle choices, including: Daily care of your teeth and gums. Regular physical activity. Eating a healthy diet. Avoiding tobacco and drug use. Limiting alcohol use. Practicing safe sex. Taking low doses of aspirin every day. Taking vitamin and mineral supplements as recommended by your health care provider. What happens during an annual well check? The services and screenings done by your health care provider during your annual well check will depend on your age,  overall health, lifestyle risk factors, and family history of disease. Counseling  Your health care provider may ask you questions about your: Alcohol use. Tobacco use. Drug use. Emotional well-being. Home and relationship well-being. Sexual activity. Eating habits. History of falls. Memory and ability to understand (cognition). Work and work Statistician. Screening  You may have the following tests or measurements: Height, weight, and BMI. Blood pressure. Lipid and cholesterol levels. These may be checked every 5 years, or more frequently if you are over 63 years old. Skin check. Lung cancer screening. You may have this screening every year starting at age 59 if you have a 30-pack-year history of smoking and currently smoke or have quit within the past 15 years. Fecal occult blood test (FOBT) of the stool. You may have this test every year starting at age 29. Flexible sigmoidoscopy or colonoscopy. You may have a sigmoidoscopy every 5 years or a colonoscopy every 10 years starting at age 33. Prostate cancer screening. Recommendations will vary depending on your family history and other risks. Hepatitis C blood test. Hepatitis B blood test. Sexually transmitted disease (STD) testing. Diabetes screening. This is done by checking your blood sugar (glucose) after you have not eaten for a while (fasting). You may have this done every 1-3 years. Abdominal aortic aneurysm (AAA) screening. You may need this if you are a current or former smoker. Osteoporosis. You may be screened starting at  age 63 if you are at high risk. Talk with your health care provider about your test results, treatment options, and if necessary, the need for more tests. Vaccines  Your health care provider may recommend certain vaccines, such as: Influenza vaccine. This is recommended every year. Tetanus, diphtheria, and acellular pertussis (Tdap, Td) vaccine. You may need a Td booster every 10 years. Zoster vaccine.  You may need this after age 54. Pneumococcal 13-valent conjugate (PCV13) vaccine. One dose is recommended after age 76. Pneumococcal polysaccharide (PPSV23) vaccine. One dose is recommended after age 24. Talk to your health care provider about which screenings and vaccines you need and how often you need them. This information is not intended to replace advice given to you by your health care provider. Make sure you discuss any questions you have with your health care provider. Document Released: 10/08/2015 Document Revised: 05/31/2016 Document Reviewed: 07/13/2015 Elsevier Interactive Patient Education  2017 [place] Prevention in the Home Falls can cause injuries. They can happen to people of all ages. There are many things you can do to make your home safe and to help prevent falls. What can I do on the outside of my home? Regularly fix the edges of walkways and driveways and fix any cracks. Remove anything that might make you trip as you walk through a door, such as a raised step or threshold. Trim any bushes or trees on the path to your home. Use bright outdoor lighting. Clear any walking paths of anything that might make someone trip, such as rocks or tools. Regularly check to see if handrails are loose or broken. Make sure that both sides of any steps have handrails. Any raised decks and porches should have guardrails on the edges. Have any leaves, snow, or ice cleared regularly. Use sand or salt on walking paths during winter. Clean up any spills in your garage right away. This includes oil or grease spills. What can I do in the bathroom? Use night lights. Install grab bars by the toilet and in the tub and shower. Do not use towel bars as grab bars. Use non-skid mats or decals in the tub or shower. If you need to sit down in the shower, use a plastic, non-slip stool. Keep the floor dry. Clean up any water that spills on the floor as soon as it happens. Remove soap  buildup in the tub or shower regularly. Attach bath mats securely with double-sided non-slip rug tape. Do not have throw rugs and other things on the floor that can make you trip. What can I do in the bedroom? Use night lights. Make sure that you have a light by your bed that is easy to reach. Do not use any sheets or blankets that are too big for your bed. They should not hang down onto the floor. Have a firm chair that has side arms. You can use this for support while you get dressed. Do not have throw rugs and other things on the floor that can make you trip. What can I do in the kitchen? Clean up any spills right away. Avoid walking on wet floors. Keep items that you use a lot in easy-to-reach places. If you need to reach something above you, use a strong step stool that has a grab bar. Keep electrical cords out of the way. Do not use floor polish or wax that makes floors slippery. If you must use wax, use non-skid floor wax. Do not have throw rugs and  other things on the floor that can make you trip. What can I do with my stairs? Do not leave any items on the stairs. Make sure that there are handrails on both sides of the stairs and use them. Fix handrails that are broken or loose. Make sure that handrails are as long as the stairways. Check any carpeting to make sure that it is firmly attached to the stairs. Fix any carpet that is loose or worn. Avoid having throw rugs at the top or bottom of the stairs. If you do have throw rugs, attach them to the floor with carpet tape. Make sure that you have a light switch at the top of the stairs and the bottom of the stairs. If you do not have them, ask someone to add them for you. What else can I do to help prevent falls? Wear shoes that: Do not have high heels. Have rubber bottoms. Are comfortable and fit you well. Are closed at the toe. Do not wear sandals. If you use a stepladder: Make sure that it is fully opened. Do not climb a closed  stepladder. Make sure that both sides of the stepladder are locked into place. Ask someone to hold it for you, if possible. Clearly mark and make sure that you can see: Any grab bars or handrails. First and last steps. Where the edge of each step is. Use tools that help you move around (mobility aids) if they are needed. These include: Canes. Walkers. Scooters. Crutches. Turn on the lights when you go into a dark area. Replace any light bulbs as soon as they burn out. Set up your furniture so you have a clear path. Avoid moving your furniture around. If any of your floors are uneven, fix them. If there are any pets around you, be aware of where they are. Review your medicines with your doctor. Some medicines can make you feel dizzy. This can increase your chance of falling. Ask your doctor what other things that you can do to help prevent falls. This information is not intended to replace advice given to you by your health care provider. Make sure you discuss any questions you have with your health care provider. Document Released: 07/08/2009 Document Revised: 02/17/2016 Document Reviewed: 10/16/2014 Elsevier Interactive Patient Education  2017 [employer].

## 2022-11-30 NOTE — Progress Notes (Signed)
I connected with  Darrell [name] on 11/30/22 by a audio enabled telemedicine application and verified that I am speaking with the correct person using two identifiers. Spouse was also on the call.  Patient Location: Home  Provider Location: Office/Clinic  I discussed the limitations of evaluation and management by telemedicine. The patient expressed understanding and agreed to proceed.  Subjective:   Darrell [name] is a 72 y.o. male who presents for Medicare Annual/Subsequent preventive examination.  Review of Systems     Cardiac Risk Factors include: advanced age (>54mn, >>71women);dyslipidemia;male gender     Objective:    Today's Vitals   11/30/22 0811 11/30/22 0812  Weight: 172 lb (78 kg)   Height: 6' 1.5" (1.867 m)   PainSc:  9    Body mass index is 22.38 kg/m.     11/30/2022    8:26 AM 11/24/2022   10:50 PM 07/07/2022   12:00 AM 07/05/2022   10:27 PM 05/19/2022    1:00 AM 04/14/2022    6:00 PM 04/14/2022    1:11 AM  Advanced Directives  Does Patient Have a Medical Advance Directive? No Yes Yes Yes No  Yes  Type of Advance Directive  Out of facility DNR (pink MOST or yellow form)  Out of facility DNR (pink MOST or yellow form)  Healthcare Power of Attorney   Does patient want to make changes to medical advance directive?  Yes (ED - Information included in AVS) No - Patient declined   No - Patient declined   Copy of [place] Chart?      No - copy requested   Would patient like information on creating a medical advance directive?     No - Patient declined    Pre-existing out of facility DNR order (yellow form or pink MOST form)  Pink Most/Yellow Form available - Physician notified to receive inpatient order         Current Medications (verified) Outpatient Encounter Medications as of 11/30/2022  Medication Sig   albuterol (VENTOLIN HFA) 108 (90 Base) MCG/ACT inhaler Inhale 2 puffs into the lungs every 6 (six) hours as needed for up to 30 days for  Wheezing.   amLODipine (NORVASC) 10 MG tablet Take 1 tablet (10 mg total) by mouth daily.   atorvastatin (LIPITOR) 40 MG tablet Take 1 tablet (40 mg total) by mouth daily.   azithromycin (ZITHROMAX) 250 MG tablet Take 1 tablet (250 mg total) by mouth 3 (three) times a week. Take on Mon, Wed, Fri   brimonidine (ALPHAGAN) 0.2 % ophthalmic solution Place 1 drop into both eyes daily.   dorzolamide-timolol (COSOPT) 22.3-6.8 MG/ML ophthalmic solution Place 1 drop into both eyes daily.   finasteride (PROSCAR) 5 MG tablet Take 5 mg by mouth daily.   fluticasone furoate-vilanterol (BREO ELLIPTA) 200-25 MCG/ACT AEPB Inhale 2 puffs into the lungs daily as needed (for shortness of breath).   glycopyrrolate (ROBINUL) 1 MG tablet Take 1 mg by mouth 2 (two) times daily.   hydrALAZINE (APRESOLINE) 25 MG tablet Take 1 tablet (25 mg total) by mouth 3 (three) times daily.   latanoprost (XALATAN) 0.005 % ophthalmic solution Place 1 drop into both eyes at bedtime.   levETIRAcetam (KEPPRA) 500 MG tablet Take 1 tablet (500 mg total) by mouth 2 (two) times daily.   metoCLOPramide (REGLAN) 5 MG tablet Take 1 tablet (5 mg total) by mouth 3 (three) times daily before meals. (Patient taking differently: Take 5 mg by mouth daily  as needed for nausea.)   metoprolol succinate (TOPROL-XL) 50 MG 24 hr tablet Take 1 tablet (50 mg total) by mouth at bedtime. Take with or immediately following a meal.   montelukast (SINGULAIR) 10 MG tablet Take 1 tablet by mouth at bedtime.   Multiple Vitamin (MULTI VITAMIN) TABS Take 1 tablet by mouth daily.   polyethylene glycol powder (MIRALAX) 17 GM/SCOOP powder    senna-docusate (SENOKOT-S) 8.6-50 MG tablet Take 2 tablets by mouth 2 (two) times daily as needed for moderate constipation.   sodium bicarbonate 650 MG tablet Take 1 tablet (650 mg total) by mouth 2 (two) times daily for 10 days.   pantoprazole (PROTONIX) 40 MG tablet Take 1 tablet (40 mg total) by mouth 2 (two) times daily. (Patient  not taking: Reported on 11/30/2022)   tamsulosin (FLOMAX) 0.4 MG CAPS capsule Take 1 capsule (0.4 mg total) by mouth daily. (Patient not taking: Reported on 11/30/2022)   No facility-administered encounter medications on file as of 11/30/2022.    Allergies (verified) Ace inhibitors, Doxycycline, Atacand hct [candesartan cilexetil-hctz], and Shellfish allergy   History: Past Medical History:  Diagnosis Date   Arthritis    Asthma    At high risk for falls 08/16/2015   CIDP (chronic inflammatory demyelinating polyneuropathy) ([name])    CKD (chronic kidney disease) stage 3, GFR 30-59 ml/min ([place]) 08/16/2015   Dysphagia as late effect of cerebrovascular disease    pts wife states pt has to eat soft foods    Elevated liver enzymes 08/10/2016   GERD (gastroesophageal reflux disease)    Glaucoma    High cholesterol    History of CVA with residual deficit 03/25/2013   Hypertension    Hypertensive retinopathy of both eyes 01/16/2017   Inguinal hernia 03/25/2013   Liver hemangioma 08/14/2016   New onset seizure ([place]) 07/08/2017   seizure 07/14/18   Nuclear sclerosis of both eyes 10/25/2016   Pneumonia    Presence of permanent cardiac pacemaker    Primary open angle glaucoma of both eyes, indeterminate stage 10/25/2016   Renal mass, right 08/14/2016   Status cardiac pacemaker 01/29/2017   Placed for second degree heart block on 01/16/17 Medtronic Azure XT DR MRI SureScan dual-chamber pacemaker   Stroke [facility])    2011 with residual deficit left sided weakness   Tobacco dependence    Past Surgical History:  Procedure Laterality Date   EYE SURGERY     HERNIA REPAIR     INGUINAL HERNIA REPAIR Right 11/23/2014   Procedure: right inguinal hernia repair with mesh;  Surgeon: Armandina Gemma, MD;  Location: [facility];  Service: General;  Laterality: Right;   INSERTION OF MESH N/A 11/23/2014   Procedure: INSERTION OF MESH;  Surgeon: Armandina Gemma, MD;  Location: [facility];  Service: General;  Laterality: N/A;    JOINT REPLACEMENT     MASS EXCISION Left 08/29/2017   Procedure: EXCISION OF LEFT NECK MASS;  Surgeon: Coralie Keens, MD;  Location: [facility];  Service: General;  Laterality: Left;   PACEMAKER IMPLANT N/A 01/16/2017   Procedure: Pacemaker Implant;  Surgeon: Will Meredith Leeds, MD;  Location: [facility];  Service: Cardiovascular;  Laterality: N/A;   SHOULDER SURGERY Bilateral 1988, 1998   Family History  Problem Relation Age of Onset   Hypertension Mother    Diabetes Sister    Hypertension Sister    Cancer Sister        1 sister   COPD Sister  in 1 sister   Stomach cancer Neg Hx    Colon cancer Neg Hx    Pancreatic cancer Neg Hx    Esophageal cancer Neg Hx    Social History   Socioeconomic History   Marital status: Married    Spouse name: [name]   Number of children: 1   Years of education: college   Highest education level: Not on file  Occupational History   Occupation: retired  Tobacco Use   Smoking status: Some Days    Packs/day: 0.25    Years: 40.00    Total pack years: 10.00    Types: Cigarettes   Smokeless tobacco: Never   Tobacco comments:    About 2-3 cigarettes every other day.  Vaping Use   Vaping Use: Never used  Substance and Sexual Activity   Alcohol use: No    Comment: alcohol free for 1 year was drinking 1/2 pint per day    Drug use: No   Sexual activity: Not on file  Other Topics Concern   Not on file  Social History Narrative   09/13/17 Patient lives at home with spouse.   Has 17 yo daughter   No grandchildren   Social Determinants of Health   Financial Resource Strain: Low Risk  (11/30/2022)   Overall Financial Resource Strain (CARDIA)    Difficulty of Paying Living Expenses: Not hard at all  Food Insecurity: No Food Insecurity (11/30/2022)   Hunger Vital Sign    Worried About Running Out of Food in the Last Year: Never true    Ran Out of Food in the Last Year: Never true  Transportation Needs: No Transportation Needs  (11/30/2022)   PRAPARE - Hydrologist (Medical): No    Lack of Transportation (Non-Medical): No  Physical Activity: Inactive (11/30/2022)   Exercise Vital Sign    Days of Exercise per Week: 0 days    Minutes of Exercise per Session: 0 min  Stress: No Stress Concern Present (11/30/2022)   [place]    Feeling of Stress : Not at all  Social Connections: Not on file    Tobacco Counseling Ready to quit: Not Answered Counseling given: Not Answered Tobacco comments: About 2-3 cigarettes every other day.   Clinical Intake:  Pre-visit preparation completed: Yes  Pain : 0-10 Pain Score: 9  Pain Location: Leg Pain Orientation: Right Pain Descriptors / Indicators: Aching Pain Onset: More than a month ago Pain Frequency: Intermittent     Nutritional Status: BMI of 19-24  Normal Nutritional Risks: None Diabetes: No  How often do you need to have someone help you when you read instructions, pamphlets, or other written materials from your doctor or pharmacy?: 5 - Always  Diabetic? no  Interpreter Needed?: No  Information entered by :: NAllen LPN   Activities of Daily Living    11/30/2022    8:27 AM 07/06/2022   11:00 PM  In your present state of health, do you have any difficulty performing the following activities:  Hearing? 0 0  Vision? 1 0  Comment blind   Difficulty concentrating or making decisions? 0 1  Walking or climbing stairs? 1 1  Dressing or bathing? 1 1  Doing errands, shopping? 1 1  Preparing Food and eating ? Y   Using the Toilet? Y   In the past six months, have you accidently leaked urine? Y   Do you have problems with loss of  bowel control? Y   Managing your Medications? Y   Managing your Finances? Y   Housekeeping or managing your Housekeeping? Y     Patient Care Team: [name], MD as PCP - General (Family Medicine) Constance Haw, MD as PCP -  Cardiology (Cardiology) Constance Haw, MD as PCP - Electrophysiology (Cardiology) Penni Bombard, MD as Consulting Physician (Neurology)  Indicate any recent Medical Services you may have received from other than [facility] providers in the past year (date may be approximate).     Assessment:   This is a routine wellness examination for Rahshawn.  Hearing/Vision screen Vision Screening - Comments:: Regular eye exams, [facility]  Dietary issues and exercise activities discussed: Current Exercise Habits: The patient does not participate in regular exercise at present   Goals Addressed             This Visit's Progress    Patient Stated       11/30/2022, wants him to feel better       Depression Screen    11/30/2022    8:27 AM 01/24/2022   11:13 AM 06/08/2021   11:48 AM 03/31/2020    1:49 PM 10/24/2019    2:49 PM 09/12/2018    9:14 AM 07/16/2018    5:25 PM  PHQ 2/9 Scores  PHQ - 2 Score 0 0 1 4 0 2 1  PHQ- 9 Score  0 '12 13  10 11    '$ Fall Risk    11/30/2022    8:26 AM 06/07/2022    2:27 PM 01/24/2022   11:09 AM 10/27/2021   10:23 AM 06/08/2021   11:47 AM  Fall Risk   Falls in the past year? 0 0 0 0 0  Number falls in past yr: 0 0 0 0 0  Injury with Fall? 0 0 0 0 0  Risk for fall due to : Impaired balance/gait;Impaired mobility;Medication side effect    No Fall Risks  Follow up Falls evaluation completed;Education provided;Falls prevention discussed        FALL RISK PREVENTION PERTAINING TO THE HOME:  Any stairs in or around the home? No  If so, are there any without handrails? N/a Home free of loose throw rugs in walkways, pet beds, electrical cords, etc? Yes  Adequate lighting in your home to reduce risk of falls? Yes   ASSISTIVE DEVICES UTILIZED TO PREVENT FALLS:  Life alert? No  Use of a cane, walker or w/c? Yes  Grab bars in the bathroom? No  Shower chair or bench in shower? Yes  Elevated toilet seat or a handicapped toilet? No   TIMED UP AND  GO:  Was the test performed? No .      Cognitive Function:        11/30/2022    8:31 AM  6CIT Screen  What Year? 0 points  What month? 3 points  What time? 3 points  Count back from 20 4 points  Months in reverse 4 points  Repeat phrase 10 points  Total Score 24 points    Immunizations Immunization History  Administered Date(s) Administered   Influenza,inj,Quad PF,6+ Mos 09/21/2014, 08/16/2015, 05/25/2016, 06/13/2018, 08/20/2019, 06/07/2022   PNEUMOCOCCAL CONJUGATE-20 01/24/2022   Pneumococcal Conjugate-13 09/12/2018   Pneumococcal Polysaccharide-23 09/21/2014   Tdap 11/25/2015    TDAP status: Up to date  Flu Vaccine status: Up to date  Pneumococcal vaccine status: Up to date  Covid-19 vaccine status: Declined, Education has been provided regarding the importance  of this vaccine but patient still declined. Advised may receive this vaccine at local pharmacy or Health Dept.or vaccine clinic. Aware to provide a copy of the vaccination record if obtained from local pharmacy or Health Dept. Verbalized acceptance and understanding.  Qualifies for Shingles Vaccine? Yes   Zostavax completed No   Shingrix Completed?: No.    Education has been provided regarding the importance of this vaccine. Patient has been advised to call insurance company to determine out of pocket expense if they have not yet received this vaccine. Advised may also receive vaccine at local pharmacy or Health Dept. Verbalized acceptance and understanding.  Screening Tests Health Maintenance  Topic Date Due   COVID-19 Vaccine (1) Never done   Zoster Vaccines- Shingrix (1 of 2) Never done   COLONOSCOPY (Pts 45-42yr Insurance coverage will need to be confirmed)  Never done   DTaP/Tdap/Td (2 - Td or Tdap) 11/24/2025   Pneumonia Vaccine 72 Years old  Completed   INFLUENZA VACCINE  Completed   Hepatitis C Screening  Completed   HPV VACCINES  Aged Out    Health Maintenance  Health Maintenance Due   Topic Date Due   COVID-19 Vaccine (1) Never done   Zoster Vaccines- Shingrix (1 of 2) Never done   COLONOSCOPY (Pts 45-468yrInsurance coverage will need to be confirmed)  Never done    Colorectal cancer screening: No longer required.   Lung Cancer Screening: (Low Dose CT Chest recommended if Age 71-80ears, 30 pack-year currently smoking OR have quit w/in 15years.) does not qualify.   Lung Cancer Screening Referral: no  Additional Screening:  Hepatitis C Screening: does qualify; Completed 11/25/2015  Vision Screening: Recommended annual ophthalmology exams for early detection of glaucoma and other disorders of the eye. Is the patient up to date with their annual eye exam?  Yes  Who is the provider or what is the name of the [facility] which the patient [facility]ends annual [facility] exams[facility] provider, would they like to be referred to a provider to establish care? No .   Dental Screening: Recommended annual dental exams for proper oral hygiene  Community Resource Referral / Chronic Care Management: CRR required this visit?  No   CCM required this visit?  No      Plan:     I have personally reviewed and noted the following in the patient's chart:   Medical and social history Use of alcohol, tobacco or illicit drugs  Current medications and supplements including opioid prescriptions. Patient is not currently taking opioid prescriptions. Functional ability and status Nutritional status Physical activity Advanced directives List of other physicians Hospitalizations, surgeries, and ER visits in previous 12 months Vitals Screenings to include cognitive, depression, and falls Referrals and appointments  In addition, I have reviewed and discussed with patient certain preventive protocols, quality metrics, and best practice recommendations. A written personalized care plan for preventive services as well as general preventive health  recommendations were provided to patient.     NiKellie SimmeringLPN   [id number] Nurse Notes: none  Due to this being a virtual visit, the after visit summary with patients personalized plan was offered to patient via mail or my-chart. to pick up at office at next visit

## 2022-12-01 DIAGNOSIS — K5901 Slow transit constipation: Secondary | ICD-10-CM | POA: Diagnosis not present

## 2022-12-01 DIAGNOSIS — Z09 Encounter for follow-up examination after completed treatment for conditions other than malignant neoplasm: Secondary | ICD-10-CM | POA: Diagnosis not present

## 2022-12-01 DIAGNOSIS — D509 Iron deficiency anemia, unspecified: Secondary | ICD-10-CM | POA: Diagnosis not present

## 2022-12-01 DIAGNOSIS — N184 Chronic kidney disease, stage 4 (severe): Secondary | ICD-10-CM | POA: Diagnosis not present

## 2022-12-05 DIAGNOSIS — N400 Enlarged prostate without lower urinary tract symptoms: Secondary | ICD-10-CM | POA: Diagnosis not present

## 2022-12-05 DIAGNOSIS — D631 Anemia in chronic kidney disease: Secondary | ICD-10-CM | POA: Diagnosis not present

## 2022-12-05 DIAGNOSIS — Z7951 Long term (current) use of inhaled steroids: Secondary | ICD-10-CM | POA: Diagnosis not present

## 2022-12-05 DIAGNOSIS — Z466 Encounter for fitting and adjustment of urinary device: Secondary | ICD-10-CM | POA: Diagnosis not present

## 2022-12-05 DIAGNOSIS — E782 Mixed hyperlipidemia: Secondary | ICD-10-CM | POA: Diagnosis not present

## 2022-12-05 DIAGNOSIS — J690 Pneumonitis due to inhalation of food and vomit: Secondary | ICD-10-CM | POA: Diagnosis not present

## 2022-12-05 DIAGNOSIS — N390 Urinary tract infection, site not specified: Secondary | ICD-10-CM | POA: Diagnosis not present

## 2022-12-05 DIAGNOSIS — M6289 Other specified disorders of muscle: Secondary | ICD-10-CM | POA: Diagnosis not present

## 2022-12-05 DIAGNOSIS — J432 Centrilobular emphysema: Secondary | ICD-10-CM | POA: Diagnosis not present

## 2022-12-05 DIAGNOSIS — Z86718 Personal history of other venous thrombosis and embolism: Secondary | ICD-10-CM | POA: Diagnosis not present

## 2022-12-05 DIAGNOSIS — F17210 Nicotine dependence, cigarettes, uncomplicated: Secondary | ICD-10-CM | POA: Diagnosis not present

## 2022-12-05 DIAGNOSIS — N184 Chronic kidney disease, stage 4 (severe): Secondary | ICD-10-CM | POA: Diagnosis not present

## 2022-12-05 DIAGNOSIS — G40909 Epilepsy, unspecified, not intractable, without status epilepticus: Secondary | ICD-10-CM | POA: Diagnosis not present

## 2022-12-05 DIAGNOSIS — I129 Hypertensive chronic kidney disease with stage 1 through stage 4 chronic kidney disease, or unspecified chronic kidney disease: Secondary | ICD-10-CM | POA: Diagnosis not present

## 2022-12-05 DIAGNOSIS — G1221 Amyotrophic lateral sclerosis: Secondary | ICD-10-CM | POA: Diagnosis not present

## 2022-12-05 DIAGNOSIS — Z950 Presence of cardiac pacemaker: Secondary | ICD-10-CM | POA: Diagnosis not present

## 2022-12-05 DIAGNOSIS — J9601 Acute respiratory failure with hypoxia: Secondary | ICD-10-CM | POA: Diagnosis not present

## 2022-12-05 DIAGNOSIS — I69354 Hemiplegia and hemiparesis following cerebral infarction affecting left non-dominant side: Secondary | ICD-10-CM | POA: Diagnosis not present

## 2022-12-05 DIAGNOSIS — H409 Unspecified glaucoma: Secondary | ICD-10-CM | POA: Diagnosis not present

## 2022-12-05 DIAGNOSIS — I69391 Dysphagia following cerebral infarction: Secondary | ICD-10-CM | POA: Diagnosis not present

## 2022-12-05 DIAGNOSIS — Z792 Long term (current) use of antibiotics: Secondary | ICD-10-CM | POA: Diagnosis not present

## 2022-12-11 DIAGNOSIS — I129 Hypertensive chronic kidney disease with stage 1 through stage 4 chronic kidney disease, or unspecified chronic kidney disease: Secondary | ICD-10-CM | POA: Diagnosis not present

## 2022-12-11 DIAGNOSIS — K5901 Slow transit constipation: Secondary | ICD-10-CM | POA: Diagnosis not present

## 2022-12-11 DIAGNOSIS — R051 Acute cough: Secondary | ICD-10-CM | POA: Diagnosis not present

## 2022-12-11 DIAGNOSIS — N184 Chronic kidney disease, stage 4 (severe): Secondary | ICD-10-CM | POA: Diagnosis not present

## 2022-12-11 DIAGNOSIS — R112 Nausea with vomiting, unspecified: Secondary | ICD-10-CM | POA: Diagnosis not present

## 2022-12-11 DIAGNOSIS — D509 Iron deficiency anemia, unspecified: Secondary | ICD-10-CM | POA: Diagnosis not present

## 2022-12-11 DIAGNOSIS — G1221 Amyotrophic lateral sclerosis: Secondary | ICD-10-CM | POA: Diagnosis not present

## 2022-12-12 DIAGNOSIS — Z950 Presence of cardiac pacemaker: Secondary | ICD-10-CM | POA: Diagnosis not present

## 2022-12-12 DIAGNOSIS — G1221 Amyotrophic lateral sclerosis: Secondary | ICD-10-CM | POA: Diagnosis not present

## 2022-12-12 DIAGNOSIS — R197 Diarrhea, unspecified: Secondary | ICD-10-CM | POA: Diagnosis not present

## 2022-12-12 DIAGNOSIS — Z792 Long term (current) use of antibiotics: Secondary | ICD-10-CM | POA: Diagnosis not present

## 2022-12-12 DIAGNOSIS — E782 Mixed hyperlipidemia: Secondary | ICD-10-CM | POA: Diagnosis not present

## 2022-12-12 DIAGNOSIS — N400 Enlarged prostate without lower urinary tract symptoms: Secondary | ICD-10-CM | POA: Diagnosis not present

## 2022-12-12 DIAGNOSIS — Z7951 Long term (current) use of inhaled steroids: Secondary | ICD-10-CM | POA: Diagnosis not present

## 2022-12-12 DIAGNOSIS — N184 Chronic kidney disease, stage 4 (severe): Secondary | ICD-10-CM | POA: Diagnosis not present

## 2022-12-12 DIAGNOSIS — G1220 Motor neuron disease, unspecified: Secondary | ICD-10-CM | POA: Diagnosis not present

## 2022-12-12 DIAGNOSIS — I517 Cardiomegaly: Secondary | ICD-10-CM | POA: Diagnosis not present

## 2022-12-12 DIAGNOSIS — G40909 Epilepsy, unspecified, not intractable, without status epilepticus: Secondary | ICD-10-CM | POA: Diagnosis not present

## 2022-12-12 DIAGNOSIS — Z466 Encounter for fitting and adjustment of urinary device: Secondary | ICD-10-CM | POA: Diagnosis not present

## 2022-12-12 DIAGNOSIS — J9601 Acute respiratory failure with hypoxia: Secondary | ICD-10-CM | POA: Diagnosis not present

## 2022-12-12 DIAGNOSIS — F17210 Nicotine dependence, cigarettes, uncomplicated: Secondary | ICD-10-CM | POA: Diagnosis not present

## 2022-12-12 DIAGNOSIS — N390 Urinary tract infection, site not specified: Secondary | ICD-10-CM | POA: Diagnosis not present

## 2022-12-12 DIAGNOSIS — L89892 Pressure ulcer of other site, stage 2: Secondary | ICD-10-CM | POA: Diagnosis not present

## 2022-12-12 DIAGNOSIS — I129 Hypertensive chronic kidney disease with stage 1 through stage 4 chronic kidney disease, or unspecified chronic kidney disease: Secondary | ICD-10-CM | POA: Diagnosis not present

## 2022-12-12 DIAGNOSIS — J3489 Other specified disorders of nose and nasal sinuses: Secondary | ICD-10-CM | POA: Diagnosis not present

## 2022-12-12 DIAGNOSIS — R059 Cough, unspecified: Secondary | ICD-10-CM | POA: Diagnosis not present

## 2022-12-12 DIAGNOSIS — I69391 Dysphagia following cerebral infarction: Secondary | ICD-10-CM | POA: Diagnosis not present

## 2022-12-12 DIAGNOSIS — R051 Acute cough: Secondary | ICD-10-CM | POA: Diagnosis not present

## 2022-12-12 DIAGNOSIS — J690 Pneumonitis due to inhalation of food and vomit: Secondary | ICD-10-CM | POA: Diagnosis not present

## 2022-12-12 DIAGNOSIS — I69354 Hemiplegia and hemiparesis following cerebral infarction affecting left non-dominant side: Secondary | ICD-10-CM | POA: Diagnosis not present

## 2022-12-12 DIAGNOSIS — D631 Anemia in chronic kidney disease: Secondary | ICD-10-CM | POA: Diagnosis not present

## 2022-12-12 DIAGNOSIS — Z86718 Personal history of other venous thrombosis and embolism: Secondary | ICD-10-CM | POA: Diagnosis not present

## 2022-12-12 DIAGNOSIS — J432 Centrilobular emphysema: Secondary | ICD-10-CM | POA: Diagnosis not present

## 2022-12-12 DIAGNOSIS — H409 Unspecified glaucoma: Secondary | ICD-10-CM | POA: Diagnosis not present

## 2022-12-14 DIAGNOSIS — D631 Anemia in chronic kidney disease: Secondary | ICD-10-CM | POA: Diagnosis not present

## 2022-12-14 DIAGNOSIS — J9601 Acute respiratory failure with hypoxia: Secondary | ICD-10-CM | POA: Diagnosis not present

## 2022-12-14 DIAGNOSIS — Z792 Long term (current) use of antibiotics: Secondary | ICD-10-CM | POA: Diagnosis not present

## 2022-12-14 DIAGNOSIS — G1221 Amyotrophic lateral sclerosis: Secondary | ICD-10-CM | POA: Diagnosis not present

## 2022-12-14 DIAGNOSIS — I129 Hypertensive chronic kidney disease with stage 1 through stage 4 chronic kidney disease, or unspecified chronic kidney disease: Secondary | ICD-10-CM | POA: Diagnosis not present

## 2022-12-14 DIAGNOSIS — F17210 Nicotine dependence, cigarettes, uncomplicated: Secondary | ICD-10-CM | POA: Diagnosis not present

## 2022-12-14 DIAGNOSIS — Z86718 Personal history of other venous thrombosis and embolism: Secondary | ICD-10-CM | POA: Diagnosis not present

## 2022-12-14 DIAGNOSIS — J690 Pneumonitis due to inhalation of food and vomit: Secondary | ICD-10-CM | POA: Diagnosis not present

## 2022-12-14 DIAGNOSIS — Z466 Encounter for fitting and adjustment of urinary device: Secondary | ICD-10-CM | POA: Diagnosis not present

## 2022-12-14 DIAGNOSIS — Z950 Presence of cardiac pacemaker: Secondary | ICD-10-CM | POA: Diagnosis not present

## 2022-12-14 DIAGNOSIS — N400 Enlarged prostate without lower urinary tract symptoms: Secondary | ICD-10-CM | POA: Diagnosis not present

## 2022-12-14 DIAGNOSIS — J432 Centrilobular emphysema: Secondary | ICD-10-CM | POA: Diagnosis not present

## 2022-12-14 DIAGNOSIS — N184 Chronic kidney disease, stage 4 (severe): Secondary | ICD-10-CM | POA: Diagnosis not present

## 2022-12-14 DIAGNOSIS — Z7951 Long term (current) use of inhaled steroids: Secondary | ICD-10-CM | POA: Diagnosis not present

## 2022-12-14 DIAGNOSIS — H409 Unspecified glaucoma: Secondary | ICD-10-CM | POA: Diagnosis not present

## 2022-12-14 DIAGNOSIS — G40909 Epilepsy, unspecified, not intractable, without status epilepticus: Secondary | ICD-10-CM | POA: Diagnosis not present

## 2022-12-14 DIAGNOSIS — I69354 Hemiplegia and hemiparesis following cerebral infarction affecting left non-dominant side: Secondary | ICD-10-CM | POA: Diagnosis not present

## 2022-12-14 DIAGNOSIS — E782 Mixed hyperlipidemia: Secondary | ICD-10-CM | POA: Diagnosis not present

## 2022-12-14 DIAGNOSIS — N390 Urinary tract infection, site not specified: Secondary | ICD-10-CM | POA: Diagnosis not present

## 2022-12-14 DIAGNOSIS — I69391 Dysphagia following cerebral infarction: Secondary | ICD-10-CM | POA: Diagnosis not present

## 2022-12-16 DIAGNOSIS — G1221 Amyotrophic lateral sclerosis: Secondary | ICD-10-CM | POA: Diagnosis not present

## 2022-12-19 ENCOUNTER — Ambulatory Visit (INDEPENDENT_AMBULATORY_CARE_PROVIDER_SITE_OTHER): Payer: Medicare HMO

## 2022-12-19 DIAGNOSIS — R338 Other retention of urine: Secondary | ICD-10-CM | POA: Diagnosis not present

## 2022-12-19 DIAGNOSIS — I441 Atrioventricular block, second degree: Secondary | ICD-10-CM

## 2022-12-19 DIAGNOSIS — D4101 Neoplasm of uncertain behavior of right kidney: Secondary | ICD-10-CM | POA: Diagnosis not present

## 2022-12-19 LAB — CUP PACEART REMOTE DEVICE CHECK
Battery Remaining Longevity: 50 mo
Battery Voltage: 2.95 V
Brady Statistic AP VP Percent: 39.19 %
Brady Statistic AP VS Percent: 1.57 %
Brady Statistic AS VP Percent: 50.36 %
Brady Statistic AS VS Percent: 8.88 %
Brady Statistic RA Percent Paced: 43.85 %
Brady Statistic RV Percent Paced: 89.54 %
Date Time Interrogation Session: 20240325204649
Implantable Lead Connection Status: 753985
Implantable Lead Connection Status: 753985
Implantable Lead Implant Date: 20180424
Implantable Lead Implant Date: 20180424
Implantable Lead Location: 753859
Implantable Lead Location: 753860
Implantable Lead Model: 5076
Implantable Lead Model: 5076
Implantable Pulse Generator Implant Date: 20180424
Lead Channel Impedance Value: 247 Ohm
Lead Channel Impedance Value: 304 Ohm
Lead Channel Impedance Value: 380 Ohm
Lead Channel Impedance Value: 380 Ohm
Lead Channel Pacing Threshold Amplitude: 0.625 V
Lead Channel Pacing Threshold Amplitude: 0.75 V
Lead Channel Pacing Threshold Pulse Width: 0.4 ms
Lead Channel Pacing Threshold Pulse Width: 0.4 ms
Lead Channel Sensing Intrinsic Amplitude: 1.75 mV
Lead Channel Sensing Intrinsic Amplitude: 1.75 mV
Lead Channel Sensing Intrinsic Amplitude: 6.625 mV
Lead Channel Sensing Intrinsic Amplitude: 6.625 mV
Lead Channel Setting Pacing Amplitude: 1.5 V
Lead Channel Setting Pacing Amplitude: 2.5 V
Lead Channel Setting Pacing Pulse Width: 0.4 ms
Lead Channel Setting Sensing Sensitivity: 2 mV
Zone Setting Status: 755011
Zone Setting Status: 755011

## 2022-12-20 DIAGNOSIS — I69391 Dysphagia following cerebral infarction: Secondary | ICD-10-CM | POA: Diagnosis not present

## 2022-12-20 DIAGNOSIS — Z792 Long term (current) use of antibiotics: Secondary | ICD-10-CM | POA: Diagnosis not present

## 2022-12-20 DIAGNOSIS — Z950 Presence of cardiac pacemaker: Secondary | ICD-10-CM | POA: Diagnosis not present

## 2022-12-20 DIAGNOSIS — I129 Hypertensive chronic kidney disease with stage 1 through stage 4 chronic kidney disease, or unspecified chronic kidney disease: Secondary | ICD-10-CM | POA: Diagnosis not present

## 2022-12-20 DIAGNOSIS — J432 Centrilobular emphysema: Secondary | ICD-10-CM | POA: Diagnosis not present

## 2022-12-20 DIAGNOSIS — J690 Pneumonitis due to inhalation of food and vomit: Secondary | ICD-10-CM | POA: Diagnosis not present

## 2022-12-20 DIAGNOSIS — G1221 Amyotrophic lateral sclerosis: Secondary | ICD-10-CM | POA: Diagnosis not present

## 2022-12-20 DIAGNOSIS — Z466 Encounter for fitting and adjustment of urinary device: Secondary | ICD-10-CM | POA: Diagnosis not present

## 2022-12-20 DIAGNOSIS — G40909 Epilepsy, unspecified, not intractable, without status epilepticus: Secondary | ICD-10-CM | POA: Diagnosis not present

## 2022-12-20 DIAGNOSIS — E782 Mixed hyperlipidemia: Secondary | ICD-10-CM | POA: Diagnosis not present

## 2022-12-20 DIAGNOSIS — J9601 Acute respiratory failure with hypoxia: Secondary | ICD-10-CM | POA: Diagnosis not present

## 2022-12-20 DIAGNOSIS — N184 Chronic kidney disease, stage 4 (severe): Secondary | ICD-10-CM | POA: Diagnosis not present

## 2022-12-20 DIAGNOSIS — D631 Anemia in chronic kidney disease: Secondary | ICD-10-CM | POA: Diagnosis not present

## 2022-12-20 DIAGNOSIS — N390 Urinary tract infection, site not specified: Secondary | ICD-10-CM | POA: Diagnosis not present

## 2022-12-20 DIAGNOSIS — N400 Enlarged prostate without lower urinary tract symptoms: Secondary | ICD-10-CM | POA: Diagnosis not present

## 2022-12-20 DIAGNOSIS — I69354 Hemiplegia and hemiparesis following cerebral infarction affecting left non-dominant side: Secondary | ICD-10-CM | POA: Diagnosis not present

## 2022-12-20 DIAGNOSIS — Z86718 Personal history of other venous thrombosis and embolism: Secondary | ICD-10-CM | POA: Diagnosis not present

## 2022-12-20 DIAGNOSIS — H409 Unspecified glaucoma: Secondary | ICD-10-CM | POA: Diagnosis not present

## 2022-12-20 DIAGNOSIS — F17210 Nicotine dependence, cigarettes, uncomplicated: Secondary | ICD-10-CM | POA: Diagnosis not present

## 2022-12-20 DIAGNOSIS — Z7951 Long term (current) use of inhaled steroids: Secondary | ICD-10-CM | POA: Diagnosis not present

## 2022-12-21 DIAGNOSIS — R531 Weakness: Secondary | ICD-10-CM | POA: Diagnosis not present

## 2022-12-21 DIAGNOSIS — Z8701 Personal history of pneumonia (recurrent): Secondary | ICD-10-CM | POA: Diagnosis not present

## 2022-12-21 DIAGNOSIS — R1310 Dysphagia, unspecified: Secondary | ICD-10-CM | POA: Diagnosis not present

## 2022-12-21 DIAGNOSIS — R0600 Dyspnea, unspecified: Secondary | ICD-10-CM | POA: Diagnosis not present

## 2022-12-22 ENCOUNTER — Telehealth: Payer: Self-pay | Admitting: Cardiology

## 2022-12-22 DIAGNOSIS — R197 Diarrhea, unspecified: Secondary | ICD-10-CM | POA: Diagnosis not present

## 2022-12-22 DIAGNOSIS — I69391 Dysphagia following cerebral infarction: Secondary | ICD-10-CM | POA: Diagnosis not present

## 2022-12-22 DIAGNOSIS — J9601 Acute respiratory failure with hypoxia: Secondary | ICD-10-CM | POA: Diagnosis not present

## 2022-12-22 DIAGNOSIS — R059 Cough, unspecified: Secondary | ICD-10-CM | POA: Diagnosis not present

## 2022-12-22 DIAGNOSIS — I69354 Hemiplegia and hemiparesis following cerebral infarction affecting left non-dominant side: Secondary | ICD-10-CM | POA: Diagnosis not present

## 2022-12-22 DIAGNOSIS — J690 Pneumonitis due to inhalation of food and vomit: Secondary | ICD-10-CM | POA: Diagnosis not present

## 2022-12-22 DIAGNOSIS — F17210 Nicotine dependence, cigarettes, uncomplicated: Secondary | ICD-10-CM | POA: Diagnosis not present

## 2022-12-22 DIAGNOSIS — J3489 Other specified disorders of nose and nasal sinuses: Secondary | ICD-10-CM | POA: Diagnosis not present

## 2022-12-22 DIAGNOSIS — I129 Hypertensive chronic kidney disease with stage 1 through stage 4 chronic kidney disease, or unspecified chronic kidney disease: Secondary | ICD-10-CM | POA: Diagnosis not present

## 2022-12-22 DIAGNOSIS — E782 Mixed hyperlipidemia: Secondary | ICD-10-CM | POA: Diagnosis not present

## 2022-12-22 DIAGNOSIS — J432 Centrilobular emphysema: Secondary | ICD-10-CM | POA: Diagnosis not present

## 2022-12-22 DIAGNOSIS — Z7951 Long term (current) use of inhaled steroids: Secondary | ICD-10-CM | POA: Diagnosis not present

## 2022-12-22 DIAGNOSIS — Z466 Encounter for fitting and adjustment of urinary device: Secondary | ICD-10-CM | POA: Diagnosis not present

## 2022-12-22 DIAGNOSIS — R0600 Dyspnea, unspecified: Secondary | ICD-10-CM | POA: Diagnosis not present

## 2022-12-22 DIAGNOSIS — N390 Urinary tract infection, site not specified: Secondary | ICD-10-CM | POA: Diagnosis not present

## 2022-12-22 DIAGNOSIS — G1220 Motor neuron disease, unspecified: Secondary | ICD-10-CM | POA: Diagnosis not present

## 2022-12-22 DIAGNOSIS — Z792 Long term (current) use of antibiotics: Secondary | ICD-10-CM | POA: Diagnosis not present

## 2022-12-22 DIAGNOSIS — G40909 Epilepsy, unspecified, not intractable, without status epilepticus: Secondary | ICD-10-CM | POA: Diagnosis not present

## 2022-12-22 DIAGNOSIS — L89892 Pressure ulcer of other site, stage 2: Secondary | ICD-10-CM | POA: Diagnosis not present

## 2022-12-22 DIAGNOSIS — H409 Unspecified glaucoma: Secondary | ICD-10-CM | POA: Diagnosis not present

## 2022-12-22 DIAGNOSIS — N400 Enlarged prostate without lower urinary tract symptoms: Secondary | ICD-10-CM | POA: Diagnosis not present

## 2022-12-22 DIAGNOSIS — Z950 Presence of cardiac pacemaker: Secondary | ICD-10-CM | POA: Diagnosis not present

## 2022-12-22 DIAGNOSIS — N184 Chronic kidney disease, stage 4 (severe): Secondary | ICD-10-CM | POA: Diagnosis not present

## 2022-12-22 DIAGNOSIS — G1221 Amyotrophic lateral sclerosis: Secondary | ICD-10-CM | POA: Diagnosis not present

## 2022-12-22 DIAGNOSIS — D631 Anemia in chronic kidney disease: Secondary | ICD-10-CM | POA: Diagnosis not present

## 2022-12-22 DIAGNOSIS — Z86718 Personal history of other venous thrombosis and embolism: Secondary | ICD-10-CM | POA: Diagnosis not present

## 2022-12-22 NOTE — Telephone Encounter (Signed)
Attempted phone call to Will, pallative care.  Left voicemail message to contact triage at [phone].

## 2022-12-22 NOTE — Telephone Encounter (Signed)
Pt c/o BP issue: STAT if pt c/o blurred vision, one-sided weakness or slurred speech  1. What are your last 5 BP readings?  3/29: 210/108  2. Are you having any other symptoms (ex. Dizziness, headache, blurred vision, passed out)?  Low HR   3. What is your BP issue? BP is normally stable, but today is 210/108, per Darrell [name] with palliative care. He mentions that patient's HR has been ranging in the 40-60's and is currently 64.

## 2022-12-25 ENCOUNTER — Telehealth: Payer: Medicare HMO | Admitting: Diagnostic Neuroimaging

## 2022-12-25 DIAGNOSIS — Z86718 Personal history of other venous thrombosis and embolism: Secondary | ICD-10-CM | POA: Diagnosis not present

## 2022-12-25 DIAGNOSIS — H409 Unspecified glaucoma: Secondary | ICD-10-CM | POA: Diagnosis not present

## 2022-12-25 DIAGNOSIS — D631 Anemia in chronic kidney disease: Secondary | ICD-10-CM | POA: Diagnosis not present

## 2022-12-25 DIAGNOSIS — I129 Hypertensive chronic kidney disease with stage 1 through stage 4 chronic kidney disease, or unspecified chronic kidney disease: Secondary | ICD-10-CM | POA: Diagnosis not present

## 2022-12-25 DIAGNOSIS — I69391 Dysphagia following cerebral infarction: Secondary | ICD-10-CM | POA: Diagnosis not present

## 2022-12-25 DIAGNOSIS — J690 Pneumonitis due to inhalation of food and vomit: Secondary | ICD-10-CM | POA: Diagnosis not present

## 2022-12-25 DIAGNOSIS — F17210 Nicotine dependence, cigarettes, uncomplicated: Secondary | ICD-10-CM | POA: Diagnosis not present

## 2022-12-25 DIAGNOSIS — G40909 Epilepsy, unspecified, not intractable, without status epilepticus: Secondary | ICD-10-CM | POA: Diagnosis not present

## 2022-12-25 DIAGNOSIS — G1221 Amyotrophic lateral sclerosis: Secondary | ICD-10-CM | POA: Diagnosis not present

## 2022-12-25 DIAGNOSIS — N184 Chronic kidney disease, stage 4 (severe): Secondary | ICD-10-CM | POA: Diagnosis not present

## 2022-12-25 DIAGNOSIS — J9601 Acute respiratory failure with hypoxia: Secondary | ICD-10-CM | POA: Diagnosis not present

## 2022-12-25 DIAGNOSIS — Z466 Encounter for fitting and adjustment of urinary device: Secondary | ICD-10-CM | POA: Diagnosis not present

## 2022-12-25 DIAGNOSIS — Z950 Presence of cardiac pacemaker: Secondary | ICD-10-CM | POA: Diagnosis not present

## 2022-12-25 DIAGNOSIS — Z792 Long term (current) use of antibiotics: Secondary | ICD-10-CM | POA: Diagnosis not present

## 2022-12-25 DIAGNOSIS — J432 Centrilobular emphysema: Secondary | ICD-10-CM | POA: Diagnosis not present

## 2022-12-25 DIAGNOSIS — N400 Enlarged prostate without lower urinary tract symptoms: Secondary | ICD-10-CM | POA: Diagnosis not present

## 2022-12-25 DIAGNOSIS — E782 Mixed hyperlipidemia: Secondary | ICD-10-CM | POA: Diagnosis not present

## 2022-12-25 DIAGNOSIS — N390 Urinary tract infection, site not specified: Secondary | ICD-10-CM | POA: Diagnosis not present

## 2022-12-25 DIAGNOSIS — I69354 Hemiplegia and hemiparesis following cerebral infarction affecting left non-dominant side: Secondary | ICD-10-CM | POA: Diagnosis not present

## 2022-12-25 DIAGNOSIS — Z7951 Long term (current) use of inhaled steroids: Secondary | ICD-10-CM | POA: Diagnosis not present

## 2022-12-26 DIAGNOSIS — N183 Chronic kidney disease, stage 3 unspecified: Secondary | ICD-10-CM | POA: Diagnosis not present

## 2022-12-26 DIAGNOSIS — I129 Hypertensive chronic kidney disease with stage 1 through stage 4 chronic kidney disease, or unspecified chronic kidney disease: Secondary | ICD-10-CM | POA: Diagnosis not present

## 2022-12-26 DIAGNOSIS — N2889 Other specified disorders of kidney and ureter: Secondary | ICD-10-CM | POA: Diagnosis not present

## 2022-12-28 DIAGNOSIS — J432 Centrilobular emphysema: Secondary | ICD-10-CM | POA: Diagnosis not present

## 2022-12-28 DIAGNOSIS — G40909 Epilepsy, unspecified, not intractable, without status epilepticus: Secondary | ICD-10-CM | POA: Diagnosis not present

## 2022-12-28 DIAGNOSIS — N400 Enlarged prostate without lower urinary tract symptoms: Secondary | ICD-10-CM | POA: Diagnosis not present

## 2022-12-28 DIAGNOSIS — I69391 Dysphagia following cerebral infarction: Secondary | ICD-10-CM | POA: Diagnosis not present

## 2022-12-28 DIAGNOSIS — Z86718 Personal history of other venous thrombosis and embolism: Secondary | ICD-10-CM | POA: Diagnosis not present

## 2022-12-28 DIAGNOSIS — J9601 Acute respiratory failure with hypoxia: Secondary | ICD-10-CM | POA: Diagnosis not present

## 2022-12-28 DIAGNOSIS — D631 Anemia in chronic kidney disease: Secondary | ICD-10-CM | POA: Diagnosis not present

## 2022-12-28 DIAGNOSIS — N390 Urinary tract infection, site not specified: Secondary | ICD-10-CM | POA: Diagnosis not present

## 2022-12-28 DIAGNOSIS — Z7951 Long term (current) use of inhaled steroids: Secondary | ICD-10-CM | POA: Diagnosis not present

## 2022-12-28 DIAGNOSIS — H409 Unspecified glaucoma: Secondary | ICD-10-CM | POA: Diagnosis not present

## 2022-12-28 DIAGNOSIS — Z950 Presence of cardiac pacemaker: Secondary | ICD-10-CM | POA: Diagnosis not present

## 2022-12-28 DIAGNOSIS — Z792 Long term (current) use of antibiotics: Secondary | ICD-10-CM | POA: Diagnosis not present

## 2022-12-28 DIAGNOSIS — E782 Mixed hyperlipidemia: Secondary | ICD-10-CM | POA: Diagnosis not present

## 2022-12-28 DIAGNOSIS — J690 Pneumonitis due to inhalation of food and vomit: Secondary | ICD-10-CM | POA: Diagnosis not present

## 2022-12-28 DIAGNOSIS — Z466 Encounter for fitting and adjustment of urinary device: Secondary | ICD-10-CM | POA: Diagnosis not present

## 2022-12-28 DIAGNOSIS — I129 Hypertensive chronic kidney disease with stage 1 through stage 4 chronic kidney disease, or unspecified chronic kidney disease: Secondary | ICD-10-CM | POA: Diagnosis not present

## 2022-12-28 DIAGNOSIS — N184 Chronic kidney disease, stage 4 (severe): Secondary | ICD-10-CM | POA: Diagnosis not present

## 2022-12-28 DIAGNOSIS — G1221 Amyotrophic lateral sclerosis: Secondary | ICD-10-CM | POA: Diagnosis not present

## 2022-12-28 DIAGNOSIS — F17210 Nicotine dependence, cigarettes, uncomplicated: Secondary | ICD-10-CM | POA: Diagnosis not present

## 2022-12-28 DIAGNOSIS — I69354 Hemiplegia and hemiparesis following cerebral infarction affecting left non-dominant side: Secondary | ICD-10-CM | POA: Diagnosis not present

## 2022-12-28 NOTE — Telephone Encounter (Signed)
Returned call to Will who states that his group will not be seeing patient again and that he's managed by a group called [facility], but is his understanding that they do not do weekly visits on this patient. Will had no updated information. Told him I would reach out to patient directly.  Called and spoke to wife [name] who states that pt's HR has been running low, but she says no issues with BP being high. Checked BP at time of call and provided 118/56, HR 56. Gives following log:  4/3 108/42   54  118/49   44 (8:54pm)   4/2 114/58   75  121/49   50  139/71   62  4/1 NONE  3/31 123/64   56  115/56   72 Wife verbalized that he is still taking Metoprolol, Hydralazine, and Amlodipine 10mg  daily. She states he is eating better than usual and seems more alert than he has in the past couple of weeks. He is having a bowel movement daily-overall, seems his condition is improving. She states she has no concerns at this time and [facility] comes to their house every 2 weeks for monitoring. Advised to call back if she has any concerns or if she sees his HR trending mid 40's-50's constantly. She is going to start taking BP 1-2 hours after medications to see how low he's dipping and possible need to alter medications. No changes made at this time.

## 2022-12-29 ENCOUNTER — Other Ambulatory Visit (HOSPITAL_COMMUNITY): Payer: Self-pay

## 2022-12-30 ENCOUNTER — Emergency Department (HOSPITAL_COMMUNITY): Payer: Medicare HMO

## 2022-12-30 ENCOUNTER — Encounter (HOSPITAL_COMMUNITY): Payer: Self-pay | Admitting: *Deleted

## 2022-12-30 ENCOUNTER — Other Ambulatory Visit: Payer: Self-pay

## 2022-12-30 ENCOUNTER — Emergency Department (HOSPITAL_COMMUNITY)
Admission: EM | Admit: 2022-12-30 | Discharge: 2022-12-30 | Disposition: A | Discharge status: Home / Self Care | Payer: Medicare HMO | Attending: Emergency Medicine | Admitting: Emergency Medicine

## 2022-12-30 DIAGNOSIS — I6782 Cerebral ischemia: Secondary | ICD-10-CM | POA: Diagnosis not present

## 2022-12-30 DIAGNOSIS — R4182 Altered mental status, unspecified: Secondary | ICD-10-CM | POA: Diagnosis not present

## 2022-12-30 DIAGNOSIS — R410 Disorientation, unspecified: Secondary | ICD-10-CM | POA: Diagnosis not present

## 2022-12-30 DIAGNOSIS — R197 Diarrhea, unspecified: Secondary | ICD-10-CM | POA: Insufficient documentation

## 2022-12-30 DIAGNOSIS — Z7401 Bed confinement status: Secondary | ICD-10-CM | POA: Diagnosis not present

## 2022-12-30 DIAGNOSIS — R110 Nausea: Secondary | ICD-10-CM | POA: Diagnosis not present

## 2022-12-30 DIAGNOSIS — R1111 Vomiting without nausea: Secondary | ICD-10-CM | POA: Diagnosis not present

## 2022-12-30 DIAGNOSIS — I6381 Other cerebral infarction due to occlusion or stenosis of small artery: Secondary | ICD-10-CM | POA: Diagnosis not present

## 2022-12-30 DIAGNOSIS — R112 Nausea with vomiting, unspecified: Secondary | ICD-10-CM

## 2022-12-30 DIAGNOSIS — T17320A Food in larynx causing asphyxiation, initial encounter: Secondary | ICD-10-CM | POA: Diagnosis not present

## 2022-12-30 DIAGNOSIS — R404 Transient alteration of awareness: Secondary | ICD-10-CM | POA: Diagnosis not present

## 2022-12-30 DIAGNOSIS — Z743 Need for continuous supervision: Secondary | ICD-10-CM | POA: Diagnosis not present

## 2022-12-30 HISTORY — DX: Amyotrophic lateral sclerosis: G12.21

## 2022-12-30 LAB — COMPREHENSIVE METABOLIC PANEL
ALT: 19 U/L (ref 0–44)
AST: 14 U/L — ABNORMAL LOW (ref 15–41)
Albumin: 2.7 g/dL — ABNORMAL LOW (ref 3.5–5.0)
Alkaline Phosphatase: 65 U/L (ref 38–126)
Anion gap: 10 (ref 5–15)
BUN: 85 mg/dL — ABNORMAL HIGH (ref 8–23)
CO2: 18 mmol/L — ABNORMAL LOW (ref 22–32)
Calcium: 9.4 mg/dL (ref 8.9–10.3)
Chloride: 112 mmol/L — ABNORMAL HIGH (ref 98–111)
Creatinine, Ser: 3.38 mg/dL — ABNORMAL HIGH (ref 0.61–1.24)
GFR, Estimated: 19 mL/min — ABNORMAL LOW (ref >60)
Glucose, Bld: 147 mg/dL — ABNORMAL HIGH (ref 70–99)
Potassium: 4.2 mmol/L (ref 3.5–5.1)
Sodium: 140 mmol/L (ref 135–145)
Total Bilirubin: 0.3 mg/dL (ref 0.3–1.2)
Total Protein: 5.9 g/dL — ABNORMAL LOW (ref 6.5–8.1)

## 2022-12-30 LAB — CBC WITH DIFFERENTIAL/PLATELET
Abs Immature Granulocytes: 0.03 10*3/uL (ref 0.00–0.07)
Basophils Absolute: 0 10*3/uL (ref 0.0–0.1)
Basophils Relative: 0 %
Eosinophils Absolute: 0.1 10*3/uL (ref 0.0–0.5)
Eosinophils Relative: 1 %
HCT: 25.5 % — ABNORMAL LOW (ref 39.0–52.0)
Hemoglobin: 7.9 g/dL — ABNORMAL LOW (ref 13.0–17.0)
Immature Granulocytes: 0 %
Lymphocytes Relative: 8 %
Lymphs Abs: 1 10*3/uL (ref 0.7–4.0)
MCH: 29 pg (ref 26.0–34.0)
MCHC: 31 g/dL (ref 30.0–36.0)
MCV: 93.8 fL (ref 80.0–100.0)
Monocytes Absolute: 0.6 10*3/uL (ref 0.1–1.0)
Monocytes Relative: 5 %
Neutro Abs: 10.7 10*3/uL — ABNORMAL HIGH (ref 1.7–7.7)
Neutrophils Relative %: 86 %
Platelets: 294 10*3/uL (ref 150–400)
RBC: 2.72 MIL/uL — ABNORMAL LOW (ref 4.22–5.81)
RDW: 14.9 % (ref 11.5–15.5)
WBC: 12.4 10*3/uL — ABNORMAL HIGH (ref 4.0–10.5)
nRBC: 0 % (ref 0.0–0.2)

## 2022-12-30 LAB — MAGNESIUM: Magnesium: 2 mg/dL (ref 1.7–2.4)

## 2022-12-30 LAB — LIPASE, BLOOD: Lipase: 43 U/L (ref 11–51)

## 2022-12-30 MED ORDER — PROMETHAZINE HCL 25 MG RE SUPP: 25.0000 mg | Freq: Four times a day (QID) | RECTAL | 0 refills | Status: DC | PRN

## 2022-12-30 MED ORDER — SODIUM CHLORIDE 0.9 % IV BOLUS
1000.0000 mL | Freq: Once | INTRAVENOUS | Status: AC
  Administered 2022-12-30: 1000 mL via INTRAVENOUS

## 2022-12-30 NOTE — ED Provider Notes (Signed)
[place] [facility] [place] Provider Note   CSN: [id number] Arrival date & time: 12/30/22  1251     History  No chief complaint on file.   Darrell [name] is a 72 y.o. male.  72 yo M with a chief complaints of vomiting.  Started at 4 AM.  Was seen by EMS this morning and refused transport.  Had reported persistent vomiting and so they were recalled out.  He denies any nausea currently.  Was given a dose of Zofran en route and has not had any vomiting since.  He denies abdominal pain denies cough or congestion.        Home Medications Prior to Admission medications   Medication Sig Start Date End Date Taking? Authorizing Provider  promethazine (PHENERGAN) 25 MG suppository Place 1 suppository (25 mg total) rectally every 6 (six) hours as needed for nausea or vomiting. 12/30/22  Yes Melene PlanFloyd, Nickey Canedo, DO  albuterol (VENTOLIN HFA) 108 (90 Base) MCG/ACT inhaler Inhale 2 puffs into the lungs every 6 (six) hours as needed for up to 30 days for Wheezing. 06/08/21   Anders SimmondsMcClung, Angela M, PA-C  amLODipine (NORVASC) 10 MG tablet Take 1 tablet (10 mg total) by mouth daily. 06/07/22   Hoy RegisterNewlin, Enobong, MD  atorvastatin (LIPITOR) 40 MG tablet Take 1 tablet (40 mg total) by mouth daily. 06/07/22   Hoy RegisterNewlin, Enobong, MD  azithromycin (ZITHROMAX) 250 MG tablet Take 1 tablet (250 mg total) by mouth 3 (three) times a week. Take on Mon, Wed, Fri 10/04/22   Virl Diamondlalere, Adewale A, MD  brimonidine (ALPHAGAN) 0.2 % ophthalmic solution Place 1 drop into both eyes daily. 07/25/21   [provider]  dorzolamide-timolol (COSOPT) 22.3-6.8 MG/ML ophthalmic solution Place 1 drop into both eyes daily.    [provider]  finasteride (PROSCAR) 5 MG tablet Take 5 mg by mouth daily. 12/13/21   [provider]  fluticasone furoate-vilanterol (BREO ELLIPTA) 200-25 MCG/ACT AEPB Inhale 2 puffs into the lungs daily as needed (for shortness of breath).    [provider]   glycopyrrolate (ROBINUL) 1 MG tablet Take 1 mg by mouth 2 (two) times daily. 10/21/22   [provider]  hydrALAZINE (APRESOLINE) 25 MG tablet Take 1 tablet (25 mg total) by mouth 3 (three) times daily. 04/25/22   Graciella Freerillery, Michael Andrew, PA-C  latanoprost (XALATAN) 0.005 % ophthalmic solution Place 1 drop into both eyes at bedtime. 07/25/21   [provider]  levETIRAcetam (KEPPRA) 500 MG tablet Take 1 tablet (500 mg total) by mouth 2 (two) times daily. 12/19/21   Penumalli, Glenford BayleyVikram R, MD  metoCLOPramide (REGLAN) 5 MG tablet Take 1 tablet (5 mg total) by mouth 3 (three) times daily before meals. Patient taking differently: Take 5 mg by mouth daily as needed for nausea. 09/16/22 11/25/23  Pahwani, Kasandra Knudseninka R, MD  metoprolol succinate (TOPROL-XL) 50 MG 24 hr tablet Take 1 tablet (50 mg total) by mouth at bedtime. Take with or immediately following a meal. 11/06/22   Camnitz, Andree CossWill [name], MD  montelukast (SINGULAIR) 10 MG tablet Take 1 tablet by mouth at bedtime. 06/19/22   [provider]  Multiple Vitamin (MULTI VITAMIN) TABS Take 1 tablet by mouth daily.    [provider]  pantoprazole (PROTONIX) 40 MG tablet Take 1 tablet (40 mg total) by mouth 2 (two) times daily. Patient not taking: Reported on 11/30/2022 09/16/22 11/25/23  Ollen BowlPahwani, Rinka R, MD  polyethylene glycol powder (MIRALAX) 17 GM/SCOOP powder  09/21/22  [provider]  senna-docusate (SENOKOT-S) 8.6-50 MG tablet Take 2 tablets by mouth 2 (two) times daily as needed for moderate constipation. 11/28/22   Almon Hercules, MD  tamsulosin (FLOMAX) 0.4 MG CAPS capsule Take 1 capsule (0.4 mg total) by mouth daily. Patient not taking: Reported on 11/30/2022 01/24/22   Hoy Register, MD      Allergies    Ace inhibitors, Doxycycline, Atacand hct [candesartan cilexetil-hctz], and Shellfish allergy    Review of Systems   Review of Systems  Physical Exam Updated Vital Signs BP 125/66   Pulse 74   Temp 98 F (36.7  C) (Axillary)   Resp 10   Ht 6' 1.5" (1.867 m)   Wt 78 kg   SpO2 98%   BMI 22.38 kg/m  Physical Exam Vitals and nursing note reviewed.  Constitutional:      Appearance: He is well-developed.  HENT:     Head: Normocephalic and atraumatic.  Eyes:     Pupils: Pupils are equal, round, and reactive to light.  Neck:     Vascular: No JVD.  Cardiovascular:     Rate and Rhythm: Normal rate and regular rhythm.     Heart sounds: No murmur heard.    No friction rub. No gallop.  Pulmonary:     Effort: No respiratory distress.     Breath sounds: No wheezing.  Abdominal:     General: There is no distension.     Tenderness: There is no abdominal tenderness. There is no guarding or rebound.  Musculoskeletal:        General: Normal range of motion.     Cervical back: Normal range of motion and neck supple.  Skin:    Coloration: Skin is not pale.     Findings: No rash.  Neurological:     Mental Status: He is alert and oriented to person, place, and time.  Psychiatric:        Behavior: Behavior normal.     ED Results / Procedures / Treatments   Labs (all labs ordered are listed, but only abnormal results are displayed) Labs Reviewed  CBC WITH DIFFERENTIAL/PLATELET - Abnormal; Notable for the following components:      Result Value   WBC 12.4 (*)    RBC 2.72 (*)    Hemoglobin 7.9 (*)    HCT 25.5 (*)    Neutro Abs 10.7 (*)    All other components within normal limits  COMPREHENSIVE METABOLIC PANEL - Abnormal; Notable for the following components:   Chloride 112 (*)    CO2 18 (*)    Glucose, Bld 147 (*)    BUN 85 (*)    Creatinine, Ser 3.38 (*)    Total Protein 5.9 (*)    Albumin 2.7 (*)    AST 14 (*)    GFR, Estimated 19 (*)    All other components within normal limits  LIPASE, BLOOD  MAGNESIUM    EKG None  Radiology DG Chest Port 1 View  Result Date: 12/30/2022 CLINICAL DATA:  Aspiration EXAM: PORTABLE CHEST 1 VIEW COMPARISON:  11/24/2022 FINDINGS: Left-sided  implanted cardiac device remains in place. Stable heart size. Aortic atherosclerosis. Mild streaky bibasilar opacities. No pleural effusion or pneumothorax. A skin fold overlies the periphery of the right hemithorax with lung markings extending more peripherally. IMPRESSION: Mild streaky bibasilar opacities, favor atelectasis. Electronically Signed   By: Duanne Guess D.O.   On: 12/30/2022 15:11   CT Head Wo Contrast  Result Date: 12/30/2022  CLINICAL DATA:  Vomiting and delirium EXAM: CT HEAD WITHOUT CONTRAST TECHNIQUE: Contiguous axial images were obtained from the base of the skull through the vertex without intravenous contrast. RADIATION DOSE REDUCTION: This exam was performed according to the departmental dose-optimization program which includes automated exposure control, adjustment of the mA and/or kV according to patient size and/or use of iterative reconstruction technique. COMPARISON:  Head CT 10/08/2021 FINDINGS: Brain: No evidence of acute infarction, hemorrhage, hydrocephalus, extra-axial collection or mass lesion/mass effect. There is moderate diffuse atrophy. There is extensive periventricular and deep white matter hypodensity which likely represents chronic small vessel ischemic change similar to prior study. There are old lacunar infarcts in the right frontal periventricular white matter, right thalamus and right basal ganglia, unchanged. There is an old cortical infarct in the left occipital lobe and right parietal lobe, unchanged. Vascular: Atherosclerotic calcifications are present within the cavernous internal carotid arteries. Skull: Normal. Negative for fracture or focal lesion. Sinuses/Orbits: No acute finding. Other: None. IMPRESSION: 1. No acute intracranial process. 2. Unchanged atrophy, chronic small vessel ischemic change, and old infarcts. Electronically Signed   By: Darliss CheneyAmy  Guttmann M.D.   On: 12/30/2022 15:07    Procedures Procedures    Medications Ordered in ED Medications   sodium chloride 0.9 % bolus 1,000 mL (1,000 mLs Intravenous New Bag/Given 12/30/22 1316)    ED Course/ Medical Decision Making/ A&P                             Medical Decision Making Amount and/or Complexity of Data Reviewed Labs: ordered. Radiology: ordered.  Risk Prescription drug management.   72 yo M with a chief complaints of nausea and vomiting.  This started this morning.  Patient has ALS, as at baseline reportedly.  He denies any nausea currently.  Will obtain a laboratory evaluation bolus of IV fluids.  Reassess.  Patients with anemia at baseline.  GFR is mildly worse than prior.  Patient's wife is little bit worried about his level of cognition.  She thinks that maybe he is little bit more sleepy than normal then tells me that he does tend to sleep most of the day.  He is only gotten about 200 cc of his bolus.  Will order a CT of the head and chest x-ray as she is also concern for possible aspiration.  Chest x-ray independently interpreted by me without obvious aspiration findings.  CT of the head without obvious acute finding.  I discussed the results with the patient and family.  Will discharge home at this time.  PCP follow-up.  3:23 PM:  I have discussed the diagnosis/risks/treatment options with the patient.  Evaluation and diagnostic testing in the emergency department does not suggest an emergent condition requiring admission or immediate intervention beyond what has been performed at this time.  They will follow up with PCP. We also discussed returning to the ED immediately if new or worsening sx occur. We discussed the sx which are most concerning (e.g., sudden worsening pain, fever, inability to tolerate by mouth) that necessitate immediate return. Medications administered to the patient during their visit and any new prescriptions provided to the patient are listed below.  Medications given during this visit Medications  sodium chloride 0.9 % bolus 1,000 mL (1,000 mLs  Intravenous New Bag/Given 12/30/22 1316)     The patient appears reasonably screen and/or stabilized for discharge and I doubt any other medical condition or other [facility] requiring further  screening, evaluation, or treatment in the ED at this time prior to discharge.          Final Clinical Impression(s) / ED Diagnoses Final diagnoses:  Nausea vomiting and diarrhea    Rx / DC Orders ED Discharge Orders          Ordered    promethazine (PHENERGAN) 25 MG suppository  Every 6 hours PRN        12/30/22 1519              Melene Plan, DO 12/30/22 1523

## 2022-12-30 NOTE — ED Triage Notes (Signed)
Pt here via [facility] from home for vomiting.  Pt with hx of ALS.  Wife states pt has vomited several times since this am.  No other symptoms per ems.  VS:  Bp 1135/90 Hr 76 O2 96 % RA RR 16

## 2022-12-30 NOTE — Discharge Instructions (Signed)
Return for inability to eat or drink.  Pain, choking.  I would have you try to start with some liquids and then if he is able to tolerate that then you can try and go back to his normal diet.  Try the Zofran and if that does not work then I have ordered you with a prescription for suppository that you can try as well.

## 2023-01-01 DIAGNOSIS — R001 Bradycardia, unspecified: Secondary | ICD-10-CM | POA: Diagnosis not present

## 2023-01-01 DIAGNOSIS — R410 Disorientation, unspecified: Secondary | ICD-10-CM | POA: Diagnosis not present

## 2023-01-01 DIAGNOSIS — Z7189 Other specified counseling: Secondary | ICD-10-CM | POA: Diagnosis not present

## 2023-01-01 DIAGNOSIS — G1221 Amyotrophic lateral sclerosis: Secondary | ICD-10-CM | POA: Diagnosis not present

## 2023-01-01 DIAGNOSIS — R112 Nausea with vomiting, unspecified: Secondary | ICD-10-CM | POA: Diagnosis not present

## 2023-01-02 DIAGNOSIS — R410 Disorientation, unspecified: Secondary | ICD-10-CM | POA: Diagnosis not present

## 2023-01-02 DIAGNOSIS — G1221 Amyotrophic lateral sclerosis: Secondary | ICD-10-CM | POA: Diagnosis not present

## 2023-01-04 DIAGNOSIS — Z86718 Personal history of other venous thrombosis and embolism: Secondary | ICD-10-CM | POA: Diagnosis not present

## 2023-01-04 DIAGNOSIS — D631 Anemia in chronic kidney disease: Secondary | ICD-10-CM | POA: Diagnosis not present

## 2023-01-04 DIAGNOSIS — Z466 Encounter for fitting and adjustment of urinary device: Secondary | ICD-10-CM | POA: Diagnosis not present

## 2023-01-04 DIAGNOSIS — H409 Unspecified glaucoma: Secondary | ICD-10-CM | POA: Diagnosis not present

## 2023-01-04 DIAGNOSIS — I69354 Hemiplegia and hemiparesis following cerebral infarction affecting left non-dominant side: Secondary | ICD-10-CM | POA: Diagnosis not present

## 2023-01-04 DIAGNOSIS — J690 Pneumonitis due to inhalation of food and vomit: Secondary | ICD-10-CM | POA: Diagnosis not present

## 2023-01-04 DIAGNOSIS — G40909 Epilepsy, unspecified, not intractable, without status epilepticus: Secondary | ICD-10-CM | POA: Diagnosis not present

## 2023-01-04 DIAGNOSIS — Z792 Long term (current) use of antibiotics: Secondary | ICD-10-CM | POA: Diagnosis not present

## 2023-01-04 DIAGNOSIS — J432 Centrilobular emphysema: Secondary | ICD-10-CM | POA: Diagnosis not present

## 2023-01-04 DIAGNOSIS — J9601 Acute respiratory failure with hypoxia: Secondary | ICD-10-CM | POA: Diagnosis not present

## 2023-01-04 DIAGNOSIS — E782 Mixed hyperlipidemia: Secondary | ICD-10-CM | POA: Diagnosis not present

## 2023-01-04 DIAGNOSIS — N390 Urinary tract infection, site not specified: Secondary | ICD-10-CM | POA: Diagnosis not present

## 2023-01-04 DIAGNOSIS — N400 Enlarged prostate without lower urinary tract symptoms: Secondary | ICD-10-CM | POA: Diagnosis not present

## 2023-01-04 DIAGNOSIS — G1221 Amyotrophic lateral sclerosis: Secondary | ICD-10-CM | POA: Diagnosis not present

## 2023-01-04 DIAGNOSIS — I129 Hypertensive chronic kidney disease with stage 1 through stage 4 chronic kidney disease, or unspecified chronic kidney disease: Secondary | ICD-10-CM | POA: Diagnosis not present

## 2023-01-04 DIAGNOSIS — I69391 Dysphagia following cerebral infarction: Secondary | ICD-10-CM | POA: Diagnosis not present

## 2023-01-04 DIAGNOSIS — N184 Chronic kidney disease, stage 4 (severe): Secondary | ICD-10-CM | POA: Diagnosis not present

## 2023-01-04 DIAGNOSIS — Z950 Presence of cardiac pacemaker: Secondary | ICD-10-CM | POA: Diagnosis not present

## 2023-01-04 DIAGNOSIS — Z7951 Long term (current) use of inhaled steroids: Secondary | ICD-10-CM | POA: Diagnosis not present

## 2023-01-04 DIAGNOSIS — F17210 Nicotine dependence, cigarettes, uncomplicated: Secondary | ICD-10-CM | POA: Diagnosis not present

## 2023-01-05 ENCOUNTER — Other Ambulatory Visit: Payer: Self-pay | Admitting: Family Medicine

## 2023-01-05 DIAGNOSIS — I69354 Hemiplegia and hemiparesis following cerebral infarction affecting left non-dominant side: Secondary | ICD-10-CM

## 2023-01-05 DIAGNOSIS — G1221 Amyotrophic lateral sclerosis: Secondary | ICD-10-CM | POA: Diagnosis not present

## 2023-01-05 DIAGNOSIS — K5909 Other constipation: Secondary | ICD-10-CM | POA: Diagnosis not present

## 2023-01-05 DIAGNOSIS — S81002D Unspecified open wound, left knee, subsequent encounter: Secondary | ICD-10-CM | POA: Diagnosis not present

## 2023-01-08 ENCOUNTER — Telehealth: Payer: Medicare HMO | Admitting: Diagnostic Neuroimaging

## 2023-01-09 ENCOUNTER — Ambulatory Visit: Payer: Medicare HMO | Admitting: Podiatry

## 2023-01-09 DIAGNOSIS — R532 Functional quadriplegia: Secondary | ICD-10-CM | POA: Diagnosis not present

## 2023-01-09 DIAGNOSIS — K5909 Other constipation: Secondary | ICD-10-CM | POA: Diagnosis not present

## 2023-01-09 DIAGNOSIS — G1221 Amyotrophic lateral sclerosis: Secondary | ICD-10-CM | POA: Diagnosis not present

## 2023-01-10 DIAGNOSIS — Z86718 Personal history of other venous thrombosis and embolism: Secondary | ICD-10-CM | POA: Diagnosis not present

## 2023-01-10 DIAGNOSIS — I129 Hypertensive chronic kidney disease with stage 1 through stage 4 chronic kidney disease, or unspecified chronic kidney disease: Secondary | ICD-10-CM | POA: Diagnosis not present

## 2023-01-10 DIAGNOSIS — Z466 Encounter for fitting and adjustment of urinary device: Secondary | ICD-10-CM | POA: Diagnosis not present

## 2023-01-10 DIAGNOSIS — G40909 Epilepsy, unspecified, not intractable, without status epilepticus: Secondary | ICD-10-CM | POA: Diagnosis not present

## 2023-01-10 DIAGNOSIS — J690 Pneumonitis due to inhalation of food and vomit: Secondary | ICD-10-CM | POA: Diagnosis not present

## 2023-01-10 DIAGNOSIS — I69391 Dysphagia following cerebral infarction: Secondary | ICD-10-CM | POA: Diagnosis not present

## 2023-01-10 DIAGNOSIS — Z792 Long term (current) use of antibiotics: Secondary | ICD-10-CM | POA: Diagnosis not present

## 2023-01-10 DIAGNOSIS — N390 Urinary tract infection, site not specified: Secondary | ICD-10-CM | POA: Diagnosis not present

## 2023-01-10 DIAGNOSIS — I69354 Hemiplegia and hemiparesis following cerebral infarction affecting left non-dominant side: Secondary | ICD-10-CM | POA: Diagnosis not present

## 2023-01-10 DIAGNOSIS — G1221 Amyotrophic lateral sclerosis: Secondary | ICD-10-CM | POA: Diagnosis not present

## 2023-01-10 DIAGNOSIS — N400 Enlarged prostate without lower urinary tract symptoms: Secondary | ICD-10-CM | POA: Diagnosis not present

## 2023-01-10 DIAGNOSIS — D631 Anemia in chronic kidney disease: Secondary | ICD-10-CM | POA: Diagnosis not present

## 2023-01-10 DIAGNOSIS — J9601 Acute respiratory failure with hypoxia: Secondary | ICD-10-CM | POA: Diagnosis not present

## 2023-01-10 DIAGNOSIS — Z7951 Long term (current) use of inhaled steroids: Secondary | ICD-10-CM | POA: Diagnosis not present

## 2023-01-10 DIAGNOSIS — Z950 Presence of cardiac pacemaker: Secondary | ICD-10-CM | POA: Diagnosis not present

## 2023-01-10 DIAGNOSIS — N184 Chronic kidney disease, stage 4 (severe): Secondary | ICD-10-CM | POA: Diagnosis not present

## 2023-01-10 DIAGNOSIS — J432 Centrilobular emphysema: Secondary | ICD-10-CM | POA: Diagnosis not present

## 2023-01-10 DIAGNOSIS — F17210 Nicotine dependence, cigarettes, uncomplicated: Secondary | ICD-10-CM | POA: Diagnosis not present

## 2023-01-10 DIAGNOSIS — H409 Unspecified glaucoma: Secondary | ICD-10-CM | POA: Diagnosis not present

## 2023-01-10 DIAGNOSIS — E782 Mixed hyperlipidemia: Secondary | ICD-10-CM | POA: Diagnosis not present

## 2023-01-11 DIAGNOSIS — G1221 Amyotrophic lateral sclerosis: Secondary | ICD-10-CM | POA: Diagnosis not present

## 2023-01-12 DIAGNOSIS — R059 Cough, unspecified: Secondary | ICD-10-CM | POA: Diagnosis not present

## 2023-01-12 DIAGNOSIS — L89892 Pressure ulcer of other site, stage 2: Secondary | ICD-10-CM | POA: Diagnosis not present

## 2023-01-12 DIAGNOSIS — G1221 Amyotrophic lateral sclerosis: Secondary | ICD-10-CM | POA: Diagnosis not present

## 2023-01-12 DIAGNOSIS — G1220 Motor neuron disease, unspecified: Secondary | ICD-10-CM | POA: Diagnosis not present

## 2023-01-12 DIAGNOSIS — R197 Diarrhea, unspecified: Secondary | ICD-10-CM | POA: Diagnosis not present

## 2023-01-12 DIAGNOSIS — J3489 Other specified disorders of nose and nasal sinuses: Secondary | ICD-10-CM | POA: Diagnosis not present

## 2023-01-14 DIAGNOSIS — G1221 Amyotrophic lateral sclerosis: Secondary | ICD-10-CM | POA: Diagnosis not present

## 2023-01-16 ENCOUNTER — Ambulatory Visit: Payer: Medicare HMO | Admitting: Podiatry

## 2023-01-17 DIAGNOSIS — Z86718 Personal history of other venous thrombosis and embolism: Secondary | ICD-10-CM | POA: Diagnosis not present

## 2023-01-17 DIAGNOSIS — H409 Unspecified glaucoma: Secondary | ICD-10-CM | POA: Diagnosis not present

## 2023-01-17 DIAGNOSIS — Z950 Presence of cardiac pacemaker: Secondary | ICD-10-CM | POA: Diagnosis not present

## 2023-01-17 DIAGNOSIS — Z792 Long term (current) use of antibiotics: Secondary | ICD-10-CM | POA: Diagnosis not present

## 2023-01-17 DIAGNOSIS — I69354 Hemiplegia and hemiparesis following cerebral infarction affecting left non-dominant side: Secondary | ICD-10-CM | POA: Diagnosis not present

## 2023-01-17 DIAGNOSIS — J9601 Acute respiratory failure with hypoxia: Secondary | ICD-10-CM | POA: Diagnosis not present

## 2023-01-17 DIAGNOSIS — N400 Enlarged prostate without lower urinary tract symptoms: Secondary | ICD-10-CM | POA: Diagnosis not present

## 2023-01-17 DIAGNOSIS — I69391 Dysphagia following cerebral infarction: Secondary | ICD-10-CM | POA: Diagnosis not present

## 2023-01-17 DIAGNOSIS — Z466 Encounter for fitting and adjustment of urinary device: Secondary | ICD-10-CM | POA: Diagnosis not present

## 2023-01-17 DIAGNOSIS — I129 Hypertensive chronic kidney disease with stage 1 through stage 4 chronic kidney disease, or unspecified chronic kidney disease: Secondary | ICD-10-CM | POA: Diagnosis not present

## 2023-01-17 DIAGNOSIS — N184 Chronic kidney disease, stage 4 (severe): Secondary | ICD-10-CM | POA: Diagnosis not present

## 2023-01-17 DIAGNOSIS — G1221 Amyotrophic lateral sclerosis: Secondary | ICD-10-CM | POA: Diagnosis not present

## 2023-01-17 DIAGNOSIS — J432 Centrilobular emphysema: Secondary | ICD-10-CM | POA: Diagnosis not present

## 2023-01-17 DIAGNOSIS — G40909 Epilepsy, unspecified, not intractable, without status epilepticus: Secondary | ICD-10-CM | POA: Diagnosis not present

## 2023-01-17 DIAGNOSIS — D631 Anemia in chronic kidney disease: Secondary | ICD-10-CM | POA: Diagnosis not present

## 2023-01-17 DIAGNOSIS — E782 Mixed hyperlipidemia: Secondary | ICD-10-CM | POA: Diagnosis not present

## 2023-01-17 DIAGNOSIS — F17210 Nicotine dependence, cigarettes, uncomplicated: Secondary | ICD-10-CM | POA: Diagnosis not present

## 2023-01-17 DIAGNOSIS — Z7951 Long term (current) use of inhaled steroids: Secondary | ICD-10-CM | POA: Diagnosis not present

## 2023-01-17 DIAGNOSIS — J690 Pneumonitis due to inhalation of food and vomit: Secondary | ICD-10-CM | POA: Diagnosis not present

## 2023-01-17 DIAGNOSIS — N390 Urinary tract infection, site not specified: Secondary | ICD-10-CM | POA: Diagnosis not present

## 2023-01-18 ENCOUNTER — Ambulatory Visit: Payer: Self-pay

## 2023-01-18 NOTE — Patient Outreach (Signed)
  Care Coordination   Follow Up Visit Note   01/18/2023 Name: ISACK LAVALLEY MRN: [id number] DOB: [birth year]  ESHAN TRUPIANO is a 72 y.o. year old male who sees Hoy Register, MD for primary care. I spoke with wife Brayten Komar by phone today.  What matters to the patients health and wellness today?  Wife Britta Mccreedy will contact [employer] to change patient's PCP to [facility].     Goals Addressed             This Visit's Progress    COMPLETED: RN Care Coordination Acitivities: no further follow up needed       Care Coordination Interventions: Placed outbound call to wife Zaeden Lastinger Instructed Mrs. Manson Passey to contact [employer] on behalf of Mr. Borre in order to have them update his PCP to [facility], discussed the phone number is listed on the back of the insurance card Determined Mrs. Gabler will take care of this by the end of the week, she was appreciative of the call to instruct her on how to make this change        SDOH assessments and interventions completed:  No     Care Coordination Interventions:  Yes, provided   Follow up plan: No further intervention required.   Encounter Outcome:  Pt. Visit Completed

## 2023-01-19 DIAGNOSIS — R338 Other retention of urine: Secondary | ICD-10-CM | POA: Diagnosis not present

## 2023-01-20 DIAGNOSIS — I2510 Atherosclerotic heart disease of native coronary artery without angina pectoris: Secondary | ICD-10-CM | POA: Diagnosis not present

## 2023-01-20 DIAGNOSIS — E7800 Pure hypercholesterolemia, unspecified: Secondary | ICD-10-CM | POA: Diagnosis not present

## 2023-01-20 DIAGNOSIS — N184 Chronic kidney disease, stage 4 (severe): Secondary | ICD-10-CM | POA: Diagnosis not present

## 2023-01-20 DIAGNOSIS — G894 Chronic pain syndrome: Secondary | ICD-10-CM | POA: Diagnosis not present

## 2023-01-20 DIAGNOSIS — D631 Anemia in chronic kidney disease: Secondary | ICD-10-CM | POA: Diagnosis not present

## 2023-01-20 DIAGNOSIS — I509 Heart failure, unspecified: Secondary | ICD-10-CM | POA: Diagnosis not present

## 2023-01-20 DIAGNOSIS — I130 Hypertensive heart and chronic kidney disease with heart failure and stage 1 through stage 4 chronic kidney disease, or unspecified chronic kidney disease: Secondary | ICD-10-CM | POA: Diagnosis not present

## 2023-01-22 DIAGNOSIS — J3489 Other specified disorders of nose and nasal sinuses: Secondary | ICD-10-CM | POA: Diagnosis not present

## 2023-01-22 DIAGNOSIS — R197 Diarrhea, unspecified: Secondary | ICD-10-CM | POA: Diagnosis not present

## 2023-01-22 DIAGNOSIS — G1220 Motor neuron disease, unspecified: Secondary | ICD-10-CM | POA: Diagnosis not present

## 2023-01-22 DIAGNOSIS — G1221 Amyotrophic lateral sclerosis: Secondary | ICD-10-CM | POA: Diagnosis not present

## 2023-01-22 DIAGNOSIS — R059 Cough, unspecified: Secondary | ICD-10-CM | POA: Diagnosis not present

## 2023-01-22 DIAGNOSIS — L89892 Pressure ulcer of other site, stage 2: Secondary | ICD-10-CM | POA: Diagnosis not present

## 2023-01-23 ENCOUNTER — Ambulatory Visit: Payer: Medicare HMO | Admitting: Podiatry

## 2023-01-23 DIAGNOSIS — M79675 Pain in left toe(s): Secondary | ICD-10-CM

## 2023-01-23 DIAGNOSIS — N183 Chronic kidney disease, stage 3 unspecified: Secondary | ICD-10-CM | POA: Diagnosis not present

## 2023-01-23 DIAGNOSIS — M79674 Pain in right toe(s): Secondary | ICD-10-CM

## 2023-01-23 DIAGNOSIS — B351 Tinea unguium: Secondary | ICD-10-CM | POA: Diagnosis not present

## 2023-01-23 NOTE — Progress Notes (Signed)
  Subjective:  Patient ID: Darrell [name], male    DOB: [birth year],  MRN: [id number]  Darrell [name] presents to clinic today for:  Chief Complaint  Patient presents with   Nail Problem    Nail trim    He has h/o CVA, ALS and DVT. He is on Eliquis.   Patient is accompanied by his wife, Darrell [name]. He has been brought to appointment by transportation. Wife states they are waiting on the arrival of a high back reclining wheelchair.  PCP is Hoy Register, MD , and last visit was  June 07, 2022.  Allergies  Allergen Reactions   Ace Inhibitors Other (See Comments)    Hyperkalemia   Doxycycline Other (See Comments)    Hiccups, cough, nausea and emesis, elevated liver enzymes, elevated eosinophils, SOB concerning for early DRESS syndrome    Atacand Hct [Candesartan Cilexetil-Hctz] Hives   Shellfish Allergy Hives    Review of Systems: Negative except as noted in the HPI.  Objective: No changes noted in today's physical examination.  Darrell [name] is a pleasant 72 y.o. male frail, in NAD. AAO x 3.  Vascular Examination: Capillary refill time immediate b/l. Vascular status intact b/l with palpable pedal pulses. Pedal hair absent b/l. No edema. No pain with calf compression b/l. Skin temperature gradient WNL b/l. No cyanosis or clubbing noted b/l LE.  Neurological Examination: Sensation grossly intact b/l with 10 gram monofilament. Vibratory sensation intact b/l.   Dermatological Examination: Pedal skin with normal turgor, texture and tone b/l. Toenails 1-5 b/l thick, discolored, elongated with subungual debris and pain on dorsal palpation. No open wounds b/l LE. No interdigital macerations noted b/l LE. No hyperkeratotic nor porokeratotic lesions present on today's visit.  Musculoskeletal Examination: Noted disuse atrophy b/l lower extremities. Utilizes wheelchair for mobility assistance.  Radiographs: None  Assessment/Plan: No diagnosis found.   No orders of the  defined types were placed in this encounter.   -Patient's family member present. All questions/concerns addressed on today's visit. -Examined patient. -Toenails 1-5 bilaterally were debrided in length and girth with sterile nail nippers and dremel. Pinpoint bleeding of L 3rd toe addressed with Lumicain Hemostatic Solution, cleansed with alcohol. Dressing applied which his wife may remove tonight. No further treatment required by patient/caregiver. -Patient/POA to call should there be question/concern in the interim.   No follow-ups on file.  Candelaria Stagers, DPM

## 2023-01-24 DIAGNOSIS — I69354 Hemiplegia and hemiparesis following cerebral infarction affecting left non-dominant side: Secondary | ICD-10-CM | POA: Diagnosis not present

## 2023-01-24 DIAGNOSIS — G1223 Primary lateral sclerosis: Secondary | ICD-10-CM | POA: Diagnosis not present

## 2023-01-24 DIAGNOSIS — R1310 Dysphagia, unspecified: Secondary | ICD-10-CM | POA: Diagnosis not present

## 2023-01-24 DIAGNOSIS — R471 Dysarthria and anarthria: Secondary | ICD-10-CM | POA: Diagnosis not present

## 2023-01-24 DIAGNOSIS — R252 Cramp and spasm: Secondary | ICD-10-CM | POA: Diagnosis not present

## 2023-01-25 DIAGNOSIS — F17210 Nicotine dependence, cigarettes, uncomplicated: Secondary | ICD-10-CM | POA: Diagnosis not present

## 2023-01-25 DIAGNOSIS — I69391 Dysphagia following cerebral infarction: Secondary | ICD-10-CM | POA: Diagnosis not present

## 2023-01-25 DIAGNOSIS — N400 Enlarged prostate without lower urinary tract symptoms: Secondary | ICD-10-CM | POA: Diagnosis not present

## 2023-01-25 DIAGNOSIS — Z466 Encounter for fitting and adjustment of urinary device: Secondary | ICD-10-CM | POA: Diagnosis not present

## 2023-01-25 DIAGNOSIS — D631 Anemia in chronic kidney disease: Secondary | ICD-10-CM | POA: Diagnosis not present

## 2023-01-25 DIAGNOSIS — E782 Mixed hyperlipidemia: Secondary | ICD-10-CM | POA: Diagnosis not present

## 2023-01-25 DIAGNOSIS — H409 Unspecified glaucoma: Secondary | ICD-10-CM | POA: Diagnosis not present

## 2023-01-25 DIAGNOSIS — I10 Essential (primary) hypertension: Secondary | ICD-10-CM | POA: Diagnosis not present

## 2023-01-25 DIAGNOSIS — I129 Hypertensive chronic kidney disease with stage 1 through stage 4 chronic kidney disease, or unspecified chronic kidney disease: Secondary | ICD-10-CM | POA: Diagnosis not present

## 2023-01-25 DIAGNOSIS — Z792 Long term (current) use of antibiotics: Secondary | ICD-10-CM | POA: Diagnosis not present

## 2023-01-25 DIAGNOSIS — N184 Chronic kidney disease, stage 4 (severe): Secondary | ICD-10-CM | POA: Diagnosis not present

## 2023-01-25 DIAGNOSIS — N390 Urinary tract infection, site not specified: Secondary | ICD-10-CM | POA: Diagnosis not present

## 2023-01-25 DIAGNOSIS — G40909 Epilepsy, unspecified, not intractable, without status epilepticus: Secondary | ICD-10-CM | POA: Diagnosis not present

## 2023-01-25 DIAGNOSIS — J301 Allergic rhinitis due to pollen: Secondary | ICD-10-CM | POA: Diagnosis not present

## 2023-01-25 DIAGNOSIS — N401 Enlarged prostate with lower urinary tract symptoms: Secondary | ICD-10-CM | POA: Diagnosis not present

## 2023-01-25 DIAGNOSIS — Z950 Presence of cardiac pacemaker: Secondary | ICD-10-CM | POA: Diagnosis not present

## 2023-01-25 DIAGNOSIS — J690 Pneumonitis due to inhalation of food and vomit: Secondary | ICD-10-CM | POA: Diagnosis not present

## 2023-01-25 DIAGNOSIS — I69354 Hemiplegia and hemiparesis following cerebral infarction affecting left non-dominant side: Secondary | ICD-10-CM | POA: Diagnosis not present

## 2023-01-25 DIAGNOSIS — J432 Centrilobular emphysema: Secondary | ICD-10-CM | POA: Diagnosis not present

## 2023-01-25 DIAGNOSIS — Z960 Presence of urogenital implants: Secondary | ICD-10-CM | POA: Diagnosis not present

## 2023-01-25 DIAGNOSIS — J9601 Acute respiratory failure with hypoxia: Secondary | ICD-10-CM | POA: Diagnosis not present

## 2023-01-25 DIAGNOSIS — Z86718 Personal history of other venous thrombosis and embolism: Secondary | ICD-10-CM | POA: Diagnosis not present

## 2023-01-25 DIAGNOSIS — K581 Irritable bowel syndrome with constipation: Secondary | ICD-10-CM | POA: Diagnosis not present

## 2023-01-25 DIAGNOSIS — G1221 Amyotrophic lateral sclerosis: Secondary | ICD-10-CM | POA: Diagnosis not present

## 2023-01-25 DIAGNOSIS — E7800 Pure hypercholesterolemia, unspecified: Secondary | ICD-10-CM | POA: Diagnosis not present

## 2023-01-25 DIAGNOSIS — Z7951 Long term (current) use of inhaled steroids: Secondary | ICD-10-CM | POA: Diagnosis not present

## 2023-01-30 NOTE — Progress Notes (Signed)
Remote pacemaker transmission.   

## 2023-01-31 ENCOUNTER — Other Ambulatory Visit: Payer: Self-pay | Admitting: Diagnostic Neuroimaging

## 2023-02-02 DIAGNOSIS — I129 Hypertensive chronic kidney disease with stage 1 through stage 4 chronic kidney disease, or unspecified chronic kidney disease: Secondary | ICD-10-CM | POA: Diagnosis not present

## 2023-02-02 DIAGNOSIS — E782 Mixed hyperlipidemia: Secondary | ICD-10-CM | POA: Diagnosis not present

## 2023-02-02 DIAGNOSIS — G40909 Epilepsy, unspecified, not intractable, without status epilepticus: Secondary | ICD-10-CM | POA: Diagnosis not present

## 2023-02-02 DIAGNOSIS — H409 Unspecified glaucoma: Secondary | ICD-10-CM | POA: Diagnosis not present

## 2023-02-02 DIAGNOSIS — Z7951 Long term (current) use of inhaled steroids: Secondary | ICD-10-CM | POA: Diagnosis not present

## 2023-02-02 DIAGNOSIS — Z792 Long term (current) use of antibiotics: Secondary | ICD-10-CM | POA: Diagnosis not present

## 2023-02-02 DIAGNOSIS — N400 Enlarged prostate without lower urinary tract symptoms: Secondary | ICD-10-CM | POA: Diagnosis not present

## 2023-02-02 DIAGNOSIS — N390 Urinary tract infection, site not specified: Secondary | ICD-10-CM | POA: Diagnosis not present

## 2023-02-02 DIAGNOSIS — J9601 Acute respiratory failure with hypoxia: Secondary | ICD-10-CM | POA: Diagnosis not present

## 2023-02-02 DIAGNOSIS — G1221 Amyotrophic lateral sclerosis: Secondary | ICD-10-CM | POA: Diagnosis not present

## 2023-02-02 DIAGNOSIS — Z86718 Personal history of other venous thrombosis and embolism: Secondary | ICD-10-CM | POA: Diagnosis not present

## 2023-02-02 DIAGNOSIS — J690 Pneumonitis due to inhalation of food and vomit: Secondary | ICD-10-CM | POA: Diagnosis not present

## 2023-02-02 DIAGNOSIS — F17210 Nicotine dependence, cigarettes, uncomplicated: Secondary | ICD-10-CM | POA: Diagnosis not present

## 2023-02-02 DIAGNOSIS — Z950 Presence of cardiac pacemaker: Secondary | ICD-10-CM | POA: Diagnosis not present

## 2023-02-02 DIAGNOSIS — J432 Centrilobular emphysema: Secondary | ICD-10-CM | POA: Diagnosis not present

## 2023-02-02 DIAGNOSIS — I69391 Dysphagia following cerebral infarction: Secondary | ICD-10-CM | POA: Diagnosis not present

## 2023-02-02 DIAGNOSIS — I69354 Hemiplegia and hemiparesis following cerebral infarction affecting left non-dominant side: Secondary | ICD-10-CM | POA: Diagnosis not present

## 2023-02-02 DIAGNOSIS — N184 Chronic kidney disease, stage 4 (severe): Secondary | ICD-10-CM | POA: Diagnosis not present

## 2023-02-02 DIAGNOSIS — Z466 Encounter for fitting and adjustment of urinary device: Secondary | ICD-10-CM | POA: Diagnosis not present

## 2023-02-02 DIAGNOSIS — D631 Anemia in chronic kidney disease: Secondary | ICD-10-CM | POA: Diagnosis not present

## 2023-02-10 DIAGNOSIS — G1221 Amyotrophic lateral sclerosis: Secondary | ICD-10-CM | POA: Diagnosis not present

## 2023-02-11 DIAGNOSIS — R059 Cough, unspecified: Secondary | ICD-10-CM | POA: Diagnosis not present

## 2023-02-11 DIAGNOSIS — R197 Diarrhea, unspecified: Secondary | ICD-10-CM | POA: Diagnosis not present

## 2023-02-11 DIAGNOSIS — G1220 Motor neuron disease, unspecified: Secondary | ICD-10-CM | POA: Diagnosis not present

## 2023-02-11 DIAGNOSIS — J3489 Other specified disorders of nose and nasal sinuses: Secondary | ICD-10-CM | POA: Diagnosis not present

## 2023-02-11 DIAGNOSIS — G1221 Amyotrophic lateral sclerosis: Secondary | ICD-10-CM | POA: Diagnosis not present

## 2023-02-11 DIAGNOSIS — L89892 Pressure ulcer of other site, stage 2: Secondary | ICD-10-CM | POA: Diagnosis not present

## 2023-02-13 DIAGNOSIS — G1221 Amyotrophic lateral sclerosis: Secondary | ICD-10-CM | POA: Diagnosis not present

## 2023-02-13 DIAGNOSIS — R338 Other retention of urine: Secondary | ICD-10-CM | POA: Diagnosis not present

## 2023-02-21 DIAGNOSIS — G1220 Motor neuron disease, unspecified: Secondary | ICD-10-CM | POA: Diagnosis not present

## 2023-02-21 DIAGNOSIS — G1221 Amyotrophic lateral sclerosis: Secondary | ICD-10-CM | POA: Diagnosis not present

## 2023-02-21 DIAGNOSIS — R197 Diarrhea, unspecified: Secondary | ICD-10-CM | POA: Diagnosis not present

## 2023-02-21 DIAGNOSIS — J3489 Other specified disorders of nose and nasal sinuses: Secondary | ICD-10-CM | POA: Diagnosis not present

## 2023-02-21 DIAGNOSIS — L89892 Pressure ulcer of other site, stage 2: Secondary | ICD-10-CM | POA: Diagnosis not present

## 2023-02-21 DIAGNOSIS — R059 Cough, unspecified: Secondary | ICD-10-CM | POA: Diagnosis not present

## 2023-02-23 DIAGNOSIS — M19242 Secondary osteoarthritis, left hand: Secondary | ICD-10-CM | POA: Diagnosis not present

## 2023-02-23 DIAGNOSIS — E7800 Pure hypercholesterolemia, unspecified: Secondary | ICD-10-CM | POA: Diagnosis not present

## 2023-02-23 DIAGNOSIS — M19241 Secondary osteoarthritis, right hand: Secondary | ICD-10-CM | POA: Diagnosis not present

## 2023-02-23 DIAGNOSIS — D631 Anemia in chronic kidney disease: Secondary | ICD-10-CM | POA: Diagnosis not present

## 2023-02-23 DIAGNOSIS — I2510 Atherosclerotic heart disease of native coronary artery without angina pectoris: Secondary | ICD-10-CM | POA: Diagnosis not present

## 2023-02-23 DIAGNOSIS — G894 Chronic pain syndrome: Secondary | ICD-10-CM | POA: Diagnosis not present

## 2023-02-23 DIAGNOSIS — I130 Hypertensive heart and chronic kidney disease with heart failure and stage 1 through stage 4 chronic kidney disease, or unspecified chronic kidney disease: Secondary | ICD-10-CM | POA: Diagnosis not present

## 2023-02-23 DIAGNOSIS — D509 Iron deficiency anemia, unspecified: Secondary | ICD-10-CM | POA: Diagnosis not present

## 2023-02-23 DIAGNOSIS — N184 Chronic kidney disease, stage 4 (severe): Secondary | ICD-10-CM | POA: Diagnosis not present

## 2023-02-23 DIAGNOSIS — I509 Heart failure, unspecified: Secondary | ICD-10-CM | POA: Diagnosis not present

## 2023-03-05 ENCOUNTER — Telehealth: Payer: Self-pay

## 2023-03-05 ENCOUNTER — Other Ambulatory Visit: Payer: Self-pay

## 2023-03-05 MED ORDER — LEVETIRACETAM 500 MG PO TABS: 500.0000 mg | ORAL_TABLET | Freq: Two times a day (BID) | ORAL | 0 refills | Status: DC

## 2023-03-05 NOTE — Telephone Encounter (Signed)
Patient's wife left a voicemail this morning asking for an appointment for her husband with Dr. Marjory Lies.  She is also asking for refill on the levetiracetam.  She did not indicate which pharmacy to use.  Please call patient at [phone]

## 2023-03-05 NOTE — Telephone Encounter (Signed)
Rx was already sent  

## 2023-03-06 DIAGNOSIS — E7800 Pure hypercholesterolemia, unspecified: Secondary | ICD-10-CM | POA: Diagnosis not present

## 2023-03-06 DIAGNOSIS — I10 Essential (primary) hypertension: Secondary | ICD-10-CM | POA: Diagnosis not present

## 2023-03-06 DIAGNOSIS — R110 Nausea: Secondary | ICD-10-CM | POA: Diagnosis not present

## 2023-03-06 DIAGNOSIS — R1310 Dysphagia, unspecified: Secondary | ICD-10-CM | POA: Diagnosis not present

## 2023-03-07 ENCOUNTER — Other Ambulatory Visit (HOSPITAL_COMMUNITY): Payer: Self-pay

## 2023-03-13 DIAGNOSIS — R338 Other retention of urine: Secondary | ICD-10-CM | POA: Diagnosis not present

## 2023-03-14 DIAGNOSIS — R053 Chronic cough: Secondary | ICD-10-CM | POA: Diagnosis not present

## 2023-03-14 DIAGNOSIS — G1220 Motor neuron disease, unspecified: Secondary | ICD-10-CM | POA: Diagnosis not present

## 2023-03-14 DIAGNOSIS — J3489 Other specified disorders of nose and nasal sinuses: Secondary | ICD-10-CM | POA: Diagnosis not present

## 2023-03-14 DIAGNOSIS — N184 Chronic kidney disease, stage 4 (severe): Secondary | ICD-10-CM | POA: Diagnosis not present

## 2023-03-14 DIAGNOSIS — R059 Cough, unspecified: Secondary | ICD-10-CM | POA: Diagnosis not present

## 2023-03-14 DIAGNOSIS — R197 Diarrhea, unspecified: Secondary | ICD-10-CM | POA: Diagnosis not present

## 2023-03-14 DIAGNOSIS — L89892 Pressure ulcer of other site, stage 2: Secondary | ICD-10-CM | POA: Diagnosis not present

## 2023-03-14 DIAGNOSIS — G1221 Amyotrophic lateral sclerosis: Secondary | ICD-10-CM | POA: Diagnosis not present

## 2023-03-16 DIAGNOSIS — G1221 Amyotrophic lateral sclerosis: Secondary | ICD-10-CM | POA: Diagnosis not present

## 2023-03-20 ENCOUNTER — Ambulatory Visit: Payer: Medicare HMO

## 2023-03-20 DIAGNOSIS — I441 Atrioventricular block, second degree: Secondary | ICD-10-CM

## 2023-03-20 LAB — CUP PACEART REMOTE DEVICE CHECK
Battery Remaining Longevity: 45 mo
Battery Voltage: 2.95 V
Brady Statistic AP VP Percent: 30.37 %
Brady Statistic AP VS Percent: 0.2 %
Brady Statistic AS VP Percent: 56.57 %
Brady Statistic AS VS Percent: 12.86 %
Brady Statistic RA Percent Paced: 38.31 %
Brady Statistic RV Percent Paced: 86.93 %
Date Time Interrogation Session: 20240623232506
Implantable Lead Connection Status: 753985
Implantable Lead Connection Status: 753985
Implantable Lead Implant Date: 20180424
Implantable Lead Implant Date: 20180424
Implantable Lead Location: 753859
Implantable Lead Location: 753860
Implantable Lead Model: 5076
Implantable Lead Model: 5076
Implantable Pulse Generator Implant Date: 20180424
Lead Channel Impedance Value: 228 Ohm
Lead Channel Impedance Value: 266 Ohm
Lead Channel Impedance Value: 342 Ohm
Lead Channel Impedance Value: 342 Ohm
Lead Channel Pacing Threshold Amplitude: 0.625 V
Lead Channel Pacing Threshold Amplitude: 0.625 V
Lead Channel Pacing Threshold Pulse Width: 0.4 ms
Lead Channel Pacing Threshold Pulse Width: 0.4 ms
Lead Channel Sensing Intrinsic Amplitude: 11.875 mV
Lead Channel Sensing Intrinsic Amplitude: 11.875 mV
Lead Channel Sensing Intrinsic Amplitude: 2 mV
Lead Channel Sensing Intrinsic Amplitude: 2 mV
Lead Channel Setting Pacing Amplitude: 1.5 V
Lead Channel Setting Pacing Amplitude: 2.5 V
Lead Channel Setting Pacing Pulse Width: 0.4 ms
Lead Channel Setting Sensing Sensitivity: 2 mV
Zone Setting Status: 755011
Zone Setting Status: 755011

## 2023-03-23 DIAGNOSIS — G894 Chronic pain syndrome: Secondary | ICD-10-CM | POA: Diagnosis not present

## 2023-03-23 DIAGNOSIS — I130 Hypertensive heart and chronic kidney disease with heart failure and stage 1 through stage 4 chronic kidney disease, or unspecified chronic kidney disease: Secondary | ICD-10-CM | POA: Diagnosis not present

## 2023-03-23 DIAGNOSIS — M19241 Secondary osteoarthritis, right hand: Secondary | ICD-10-CM | POA: Diagnosis not present

## 2023-03-23 DIAGNOSIS — I509 Heart failure, unspecified: Secondary | ICD-10-CM | POA: Diagnosis not present

## 2023-03-23 DIAGNOSIS — E7800 Pure hypercholesterolemia, unspecified: Secondary | ICD-10-CM | POA: Diagnosis not present

## 2023-03-23 DIAGNOSIS — M19242 Secondary osteoarthritis, left hand: Secondary | ICD-10-CM | POA: Diagnosis not present

## 2023-03-23 DIAGNOSIS — D631 Anemia in chronic kidney disease: Secondary | ICD-10-CM | POA: Diagnosis not present

## 2023-03-23 DIAGNOSIS — N184 Chronic kidney disease, stage 4 (severe): Secondary | ICD-10-CM | POA: Diagnosis not present

## 2023-03-23 DIAGNOSIS — N401 Enlarged prostate with lower urinary tract symptoms: Secondary | ICD-10-CM | POA: Diagnosis not present

## 2023-03-23 DIAGNOSIS — I2510 Atherosclerotic heart disease of native coronary artery without angina pectoris: Secondary | ICD-10-CM | POA: Diagnosis not present

## 2023-03-24 DIAGNOSIS — J3489 Other specified disorders of nose and nasal sinuses: Secondary | ICD-10-CM | POA: Diagnosis not present

## 2023-03-24 DIAGNOSIS — G1221 Amyotrophic lateral sclerosis: Secondary | ICD-10-CM | POA: Diagnosis not present

## 2023-03-24 DIAGNOSIS — R197 Diarrhea, unspecified: Secondary | ICD-10-CM | POA: Diagnosis not present

## 2023-03-24 DIAGNOSIS — R059 Cough, unspecified: Secondary | ICD-10-CM | POA: Diagnosis not present

## 2023-03-24 DIAGNOSIS — L89892 Pressure ulcer of other site, stage 2: Secondary | ICD-10-CM | POA: Diagnosis not present

## 2023-03-24 DIAGNOSIS — G1220 Motor neuron disease, unspecified: Secondary | ICD-10-CM | POA: Diagnosis not present

## 2023-03-26 DIAGNOSIS — R001 Bradycardia, unspecified: Secondary | ICD-10-CM | POA: Diagnosis not present

## 2023-03-26 DIAGNOSIS — Z7401 Bed confinement status: Secondary | ICD-10-CM | POA: Diagnosis not present

## 2023-03-26 DIAGNOSIS — I129 Hypertensive chronic kidney disease with stage 1 through stage 4 chronic kidney disease, or unspecified chronic kidney disease: Secondary | ICD-10-CM | POA: Diagnosis not present

## 2023-03-26 DIAGNOSIS — G1221 Amyotrophic lateral sclerosis: Secondary | ICD-10-CM | POA: Diagnosis not present

## 2023-03-26 DIAGNOSIS — D509 Iron deficiency anemia, unspecified: Secondary | ICD-10-CM | POA: Diagnosis not present

## 2023-03-26 DIAGNOSIS — N184 Chronic kidney disease, stage 4 (severe): Secondary | ICD-10-CM | POA: Diagnosis not present

## 2023-04-02 DIAGNOSIS — R1312 Dysphagia, oropharyngeal phase: Secondary | ICD-10-CM | POA: Diagnosis not present

## 2023-04-02 DIAGNOSIS — R001 Bradycardia, unspecified: Secondary | ICD-10-CM | POA: Diagnosis not present

## 2023-04-02 DIAGNOSIS — D509 Iron deficiency anemia, unspecified: Secondary | ICD-10-CM | POA: Diagnosis not present

## 2023-04-02 DIAGNOSIS — N184 Chronic kidney disease, stage 4 (severe): Secondary | ICD-10-CM | POA: Diagnosis not present

## 2023-04-02 DIAGNOSIS — G1221 Amyotrophic lateral sclerosis: Secondary | ICD-10-CM | POA: Diagnosis not present

## 2023-04-02 DIAGNOSIS — Z0001 Encounter for general adult medical examination with abnormal findings: Secondary | ICD-10-CM | POA: Diagnosis not present

## 2023-04-02 DIAGNOSIS — I129 Hypertensive chronic kidney disease with stage 1 through stage 4 chronic kidney disease, or unspecified chronic kidney disease: Secondary | ICD-10-CM | POA: Diagnosis not present

## 2023-04-02 DIAGNOSIS — Z978 Presence of other specified devices: Secondary | ICD-10-CM | POA: Diagnosis not present

## 2023-04-08 ENCOUNTER — Other Ambulatory Visit: Payer: Self-pay | Admitting: Diagnostic Neuroimaging

## 2023-04-09 ENCOUNTER — Telehealth: Payer: Self-pay | Admitting: Diagnostic Neuroimaging

## 2023-04-09 NOTE — Telephone Encounter (Signed)
Pt has upcoming appointment for tomorrow. Will await his visit and send the refill in after his visit is completed.

## 2023-04-09 NOTE — Telephone Encounter (Signed)
Pt requesting refill of  levETIRAcetam (KEPPRA) 500 MG tablet at [id number]

## 2023-04-10 ENCOUNTER — Ambulatory Visit: Payer: Medicare HMO | Admitting: Diagnostic Neuroimaging

## 2023-04-10 DIAGNOSIS — Z7189 Other specified counseling: Secondary | ICD-10-CM | POA: Diagnosis not present

## 2023-04-10 DIAGNOSIS — N184 Chronic kidney disease, stage 4 (severe): Secondary | ICD-10-CM | POA: Diagnosis not present

## 2023-04-10 DIAGNOSIS — R1110 Vomiting, unspecified: Secondary | ICD-10-CM | POA: Diagnosis not present

## 2023-04-10 DIAGNOSIS — D509 Iron deficiency anemia, unspecified: Secondary | ICD-10-CM | POA: Diagnosis not present

## 2023-04-10 DIAGNOSIS — R001 Bradycardia, unspecified: Secondary | ICD-10-CM | POA: Diagnosis not present

## 2023-04-11 NOTE — Progress Notes (Signed)
 Remote pacemaker transmission.   

## 2023-04-12 DIAGNOSIS — R338 Other retention of urine: Secondary | ICD-10-CM | POA: Diagnosis not present

## 2023-04-13 DIAGNOSIS — G1221 Amyotrophic lateral sclerosis: Secondary | ICD-10-CM | POA: Diagnosis not present

## 2023-04-13 DIAGNOSIS — R197 Diarrhea, unspecified: Secondary | ICD-10-CM | POA: Diagnosis not present

## 2023-04-13 DIAGNOSIS — R059 Cough, unspecified: Secondary | ICD-10-CM | POA: Diagnosis not present

## 2023-04-13 DIAGNOSIS — G1220 Motor neuron disease, unspecified: Secondary | ICD-10-CM | POA: Diagnosis not present

## 2023-04-13 DIAGNOSIS — J3489 Other specified disorders of nose and nasal sinuses: Secondary | ICD-10-CM | POA: Diagnosis not present

## 2023-04-13 DIAGNOSIS — L89892 Pressure ulcer of other site, stage 2: Secondary | ICD-10-CM | POA: Diagnosis not present

## 2023-04-14 DIAGNOSIS — G894 Chronic pain syndrome: Secondary | ICD-10-CM | POA: Diagnosis not present

## 2023-04-14 DIAGNOSIS — I130 Hypertensive heart and chronic kidney disease with heart failure and stage 1 through stage 4 chronic kidney disease, or unspecified chronic kidney disease: Secondary | ICD-10-CM | POA: Diagnosis not present

## 2023-04-14 DIAGNOSIS — E7800 Pure hypercholesterolemia, unspecified: Secondary | ICD-10-CM | POA: Diagnosis not present

## 2023-04-14 DIAGNOSIS — D509 Iron deficiency anemia, unspecified: Secondary | ICD-10-CM | POA: Diagnosis not present

## 2023-04-14 DIAGNOSIS — N184 Chronic kidney disease, stage 4 (severe): Secondary | ICD-10-CM | POA: Diagnosis not present

## 2023-04-14 DIAGNOSIS — I509 Heart failure, unspecified: Secondary | ICD-10-CM | POA: Diagnosis not present

## 2023-04-14 DIAGNOSIS — I2510 Atherosclerotic heart disease of native coronary artery without angina pectoris: Secondary | ICD-10-CM | POA: Diagnosis not present

## 2023-04-14 DIAGNOSIS — D631 Anemia in chronic kidney disease: Secondary | ICD-10-CM | POA: Diagnosis not present

## 2023-04-14 DIAGNOSIS — M19242 Secondary osteoarthritis, left hand: Secondary | ICD-10-CM | POA: Diagnosis not present

## 2023-04-14 DIAGNOSIS — M19241 Secondary osteoarthritis, right hand: Secondary | ICD-10-CM | POA: Diagnosis not present

## 2023-04-15 ENCOUNTER — Emergency Department (HOSPITAL_COMMUNITY)
Admission: EM | Admit: 2023-04-15 | Discharge: 2023-04-26 | Disposition: E | Payer: Medicare HMO | Attending: Emergency Medicine | Admitting: Emergency Medicine

## 2023-04-15 DIAGNOSIS — N183 Chronic kidney disease, stage 3 unspecified: Secondary | ICD-10-CM | POA: Diagnosis not present

## 2023-04-15 DIAGNOSIS — Z79899 Other long term (current) drug therapy: Secondary | ICD-10-CM | POA: Diagnosis not present

## 2023-04-15 DIAGNOSIS — I129 Hypertensive chronic kidney disease with stage 1 through stage 4 chronic kidney disease, or unspecified chronic kidney disease: Secondary | ICD-10-CM | POA: Diagnosis not present

## 2023-04-15 DIAGNOSIS — F17200 Nicotine dependence, unspecified, uncomplicated: Secondary | ICD-10-CM | POA: Insufficient documentation

## 2023-04-15 DIAGNOSIS — R739 Hyperglycemia, unspecified: Secondary | ICD-10-CM | POA: Diagnosis not present

## 2023-04-15 DIAGNOSIS — I499 Cardiac arrhythmia, unspecified: Secondary | ICD-10-CM | POA: Diagnosis not present

## 2023-04-15 DIAGNOSIS — Z743 Need for continuous supervision: Secondary | ICD-10-CM | POA: Diagnosis not present

## 2023-04-15 DIAGNOSIS — Z950 Presence of cardiac pacemaker: Secondary | ICD-10-CM | POA: Insufficient documentation

## 2023-04-15 DIAGNOSIS — K922 Gastrointestinal hemorrhage, unspecified: Secondary | ICD-10-CM | POA: Diagnosis not present

## 2023-04-15 DIAGNOSIS — R58 Hemorrhage, not elsewhere classified: Secondary | ICD-10-CM | POA: Diagnosis not present

## 2023-04-15 DIAGNOSIS — J45909 Unspecified asthma, uncomplicated: Secondary | ICD-10-CM | POA: Insufficient documentation

## 2023-04-15 DIAGNOSIS — Z7951 Long term (current) use of inhaled steroids: Secondary | ICD-10-CM | POA: Diagnosis not present

## 2023-04-15 DIAGNOSIS — G1221 Amyotrophic lateral sclerosis: Secondary | ICD-10-CM | POA: Diagnosis not present

## 2023-04-15 DIAGNOSIS — R404 Transient alteration of awareness: Secondary | ICD-10-CM | POA: Diagnosis not present

## 2023-04-15 DIAGNOSIS — Z7901 Long term (current) use of anticoagulants: Secondary | ICD-10-CM | POA: Diagnosis not present

## 2023-04-15 DIAGNOSIS — R1110 Vomiting, unspecified: Secondary | ICD-10-CM | POA: Diagnosis not present

## 2023-04-23 DIAGNOSIS — R197 Diarrhea, unspecified: Secondary | ICD-10-CM | POA: Diagnosis not present

## 2023-04-23 DIAGNOSIS — G1220 Motor neuron disease, unspecified: Secondary | ICD-10-CM | POA: Diagnosis not present

## 2023-04-23 DIAGNOSIS — L89892 Pressure ulcer of other site, stage 2: Secondary | ICD-10-CM | POA: Diagnosis not present

## 2023-04-23 DIAGNOSIS — R059 Cough, unspecified: Secondary | ICD-10-CM | POA: Diagnosis not present

## 2023-04-23 DIAGNOSIS — J3489 Other specified disorders of nose and nasal sinuses: Secondary | ICD-10-CM | POA: Diagnosis not present

## 2023-04-23 DIAGNOSIS — G1221 Amyotrophic lateral sclerosis: Secondary | ICD-10-CM | POA: Diagnosis not present

## 2023-04-25 ENCOUNTER — Ambulatory Visit: Payer: Medicare HMO | Admitting: Podiatry

## 2023-04-26 DIAGNOSIS — 419620001 Death: Secondary | SNOMED CT | POA: Diagnosis not present

## 2023-04-26 NOTE — ED Notes (Signed)
Family at bedside with chaplain and body- No autopsy requested per spouse

## 2023-04-26 NOTE — ED Notes (Addendum)
Bed placement notified of death and preparing to transport to morgue

## 2023-04-26 NOTE — Progress Notes (Signed)
   05/05/23 1051  Spiritual Encounters  Type of Visit Initial  Care provided to: [facility] partners present during encounter Physician;Nurse  Referral source Other (comment) (Page from ED bridge)  Reason for visit Patient death  OnCall Visit Yes  Spiritual Framework  Presenting Themes Values and beliefs;Significant life change;Impactful experiences and emotions;Community and relationships  Values/beliefs death is relief from suffering  Community/Connection Family;Faith community;Spiritual leader  Family Stress Factors Financial concerns;Major life changes  Interventions  Spiritual Care Interventions Made Established relationship of care and support;Compassionate presence;Reflective listening;Normalization of emotions;Meaning making;Bereavement/grief support;Supported grief process  Intervention Outcomes  Outcomes Reduced anxiety;Reduced fear;Reduced isolation;Connection to spiritual care;Awareness of support  Spiritual Care Plan  Spiritual Care Issues Still Outstanding No further spiritual care needs at this time (see row info)   Chaplain responded to a page from the ED for a patient that was DOA. Chaplain accompanied physician and nurse to inform patient's wife Britta Mccreedy) of the patient's death. Chaplain remained with wife and provided her grief support. Chaplain and nurse escorted the wife to see the patient. Chaplain remained in the ED to meet and escort more family back as they arrived. Family's pastor had arrived. Once all the expected family arrived, chaplain let family grieve in the care of their pastor. Chaplain let patient's wife know to contact staff if she needed further chaplain support.   Arlyce Dice, Chaplain Resident [phone]

## 2023-04-26 NOTE — ED Triage Notes (Addendum)
Patient had black tarry stools since early am. Spouse reports that patient has been feeling ill x 2 days. EMS found unresponsive and patient covered in coffee ground emesis and dark stool. EMS attempted to ventilate with BVM and still had pulses. Took patient to truck and then lost central pulses enroute and has DNR order.  EMS discontinued efforts at 0831. No IVs pta- Brought to ED because efforts D/C'd enroute.

## 2023-04-26 NOTE — ED Provider Notes (Signed)
[place] EMERGENCY DEPARTMENT AT [facility] Provider Note   CSN: [id number] Arrival date & time: 04/28/23  0845     History  Chief Complaint  Patient presents with   Dead On Arrival    Darrell [name] is a 72 y.o. male with HTN, HLD, CKD-4, chronic indwelling Foley due to neurogenic bladder, heart block s/p pacemaker, prior CVA w/ residual L sided weakness, primary lateral sclerosis, h/o DVT, dysphagia 1 on pured diet and seizure disorder  who presents with to the emergency department after his wife waking up in bed to him with copious coffee-ground emesis and melena.  He was already unresponsive when she found him. He does not take a blood thinner.  En route to the ED with EMS patient continued to have copious coffee-ground emesis and melena and lost pulses.  Patient was DNR and no resuscitation was attempted and route.  Patient arrived to the ED with no respirations, no pulse, no corneal reflex.   Per chart review, during an admission in December 2023 he had ABLA thought to be due to low-grade UGIB. Eliquis was discontinued at that time.    Past Medical History:  Diagnosis Date   ALS (amyotrophic lateral sclerosis) ([name])    Arthritis    Asthma    At high risk for falls 08/16/2015   CIDP (chronic inflammatory demyelinating polyneuropathy) ([name])    CKD (chronic kidney disease) stage 3, GFR 30-59 ml/min ([name]) 08/16/2015   Dysphagia as late effect of cerebrovascular disease    pts wife states pt has to eat soft foods    Elevated liver enzymes 08/10/2016   GERD (gastroesophageal reflux disease)    Glaucoma    High cholesterol    History of CVA with residual deficit 03/25/2013   Hypertension    Hypertensive retinopathy of both eyes 01/16/2017   Inguinal hernia 03/25/2013   Liver hemangioma 08/14/2016   New onset seizure ([name]) 07/08/2017   seizure 07/14/18   Nuclear sclerosis of both eyes 10/25/2016   Pneumonia    Presence of permanent cardiac pacemaker    Primary  open angle glaucoma of both eyes, indeterminate stage 10/25/2016   Renal mass, right 08/14/2016   Status cardiac pacemaker 01/29/2017   Placed for second degree heart block on 01/16/17 Medtronic Azure XT DR MRI SureScan dual-chamber pacemaker   Stroke [facility])    2011 with residual deficit left sided weakness   Tobacco dependence        Home Medications Prior to Admission medications   Medication Sig Start Date End Date Taking? Authorizing Provider  albuterol (VENTOLIN HFA) 108 (90 Base) MCG/ACT inhaler Inhale 2 puffs into the lungs every 6 (six) hours as needed for up to 30 days for Wheezing. 06/08/21   Anders Simmonds, PA-C  amLODipine (NORVASC) 10 MG tablet Take 1 tablet (10 mg total) by mouth daily. 06/07/22   Hoy Register, MD  atorvastatin (LIPITOR) 40 MG tablet Take 1 tablet by mouth once daily 01/05/23   Hoy Register, MD  azithromycin (ZITHROMAX) 250 MG tablet Take 1 tablet (250 mg total) by mouth 3 (three) times a week. Take on Mon, Wed, Fri 10/04/22   Virl Diamond A, MD  brimonidine (ALPHAGAN) 0.2 % ophthalmic solution Place 1 drop into both eyes daily. 07/25/21   [provider]  dorzolamide-timolol (COSOPT) 22.3-6.8 MG/ML ophthalmic solution Place 1 drop into both eyes daily.    [provider]  finasteride (PROSCAR) 5 MG tablet Take 5 mg by mouth daily. 12/13/21  [provider]  fluticasone furoate-vilanterol (BREO ELLIPTA) 200-25 MCG/ACT AEPB Inhale 2 puffs into the lungs daily as needed (for shortness of breath).    [provider]  glycopyrrolate (ROBINUL) 1 MG tablet Take 1 mg by mouth 2 (two) times daily. 10/21/22   [provider]  hydrALAZINE (APRESOLINE) 25 MG tablet Take 1 tablet (25 mg total) by mouth 3 (three) times daily. 04/25/22   Graciella Freer, PA-C  latanoprost (XALATAN) 0.005 % ophthalmic solution Place 1 drop into both eyes at bedtime. 07/25/21   [provider]  levETIRAcetam (KEPPRA) 500 MG  tablet Take 1 tablet by mouth twice daily 04/10/23   Penumalli, Glenford Bayley, MD  metoCLOPramide (REGLAN) 5 MG tablet Take 1 tablet (5 mg total) by mouth 3 (three) times daily before meals. Patient taking differently: Take 5 mg by mouth daily as needed for nausea. 09/16/22 11/25/23  Pahwani, Kasandra Knudsen, MD  metoprolol succinate (TOPROL-XL) 50 MG 24 hr tablet Take 1 tablet (50 mg total) by mouth at bedtime. Take with or immediately following a meal. 11/06/22   Camnitz, Andree Coss, MD  montelukast (SINGULAIR) 10 MG tablet Take 1 tablet by mouth at bedtime. 06/19/22   [provider]  Multiple Vitamin (MULTI VITAMIN) TABS Take 1 tablet by mouth daily.    [provider]  pantoprazole (PROTONIX) 40 MG tablet Take 1 tablet (40 mg total) by mouth 2 (two) times daily. Patient not taking: Reported on 11/30/2022 09/16/22 11/25/23  Ollen Bowl, MD  polyethylene glycol powder (MIRALAX) 17 GM/SCOOP powder  09/21/22   [provider]  promethazine (PHENERGAN) 25 MG suppository Place 1 suppository (25 mg total) rectally every 6 (six) hours as needed for nausea or vomiting. 12/30/22   Melene Plan, DO  senna-docusate (SENOKOT-S) 8.6-50 MG tablet Take 2 tablets by mouth 2 (two) times daily as needed for moderate constipation. 11/28/22   Almon Hercules, MD  tamsulosin (FLOMAX) 0.4 MG CAPS capsule Take 1 capsule (0.4 mg total) by mouth daily. Patient not taking: Reported on 11/30/2022 01/24/22   Hoy Register, MD      Allergies    Ace inhibitors, Doxycycline, Atacand hct [candesartan cilexetil-hctz], and Shellfish allergy    Review of Systems   Review of Systems A 10 point review of systems was performed and is negative unless otherwise reported in HPI.  Physical Exam Updated Vital Signs Ht 6' (1.829 m)   Wt 78 kg   BMI 23.32 kg/m  Physical Exam HEENT: No corneal reflex, unresponsive pupils midrange. Copious coffee ground emesis filling the patient's mouth and nose.  Cardiology: No heart sounds.  No pulse.  Resp: No breath sounds.  Abd: Soft, non-distended. GU: Melena noted between patient's legs. MSK: No peripheral edema or signs of trauma. Extremities without deformity. Skin: cool, dry. Pale. Neuro: Unresponsive  ED Results / Procedures / Treatments   Labs (all labs ordered are listed, but only abnormal results are displayed) Labs Reviewed - No data to display  EKG None  Radiology No results found.  Procedures Procedures    Medications Ordered in ED Medications - No data to display  ED Course/ Medical Decision Making/ A&P                          Medical Decision Making     MDM:    This patient presents to the ED with DNR and lost pulses en route with EMS after catastrophic GIB. Darrell [name] was  not moving or breathing spontaneously and was not responsive. On exam, vital signs were absent and there were no heart or breath sounds. Pupils were not reactive to light. Time of death was 9:05 AM on May 08, 2023 at the completion of my exam. Patient's wife was notified with RN and chaplain present. She declined autopsy. Charge nurse notified [employer] and decedent care. Called and left a message with patient's PCP Dr. Yevonne [facility].      Additional history obtained from EMS, chart review.    Social Determinants of Health: Lives with wife  Disposition:  Expired  Co morbidities that complicate the patient evaluation  Past Medical History:  Diagnosis Date   ALS (amyotrophic lateral sclerosis) ([name])    Arthritis    Asthma    At high risk for falls 08/16/2015   CIDP (chronic inflammatory demyelinating polyneuropathy) ([name])    CKD (chronic kidney disease) stage 3, GFR 30-59 ml/min ([name]) 08/16/2015   Dysphagia as late effect of cerebrovascular disease    pts wife states pt has to eat soft foods    Elevated liver enzymes 08/10/2016   GERD (gastroesophageal reflux disease)    Glaucoma    High cholesterol    History of CVA with residual deficit  03/25/2013   Hypertension    Hypertensive retinopathy of both eyes 01/16/2017   Inguinal hernia 03/25/2013   Liver hemangioma 08/14/2016   New onset seizure ([name]) 07/08/2017   seizure 07/14/18   Nuclear sclerosis of both eyes 10/25/2016   Pneumonia    Presence of permanent cardiac pacemaker    Primary open angle glaucoma of both eyes, indeterminate stage 10/25/2016   Renal mass, right 08/14/2016   Status cardiac pacemaker 01/29/2017   Placed for second degree heart block on 01/16/17 Medtronic Azure XT DR MRI SureScan dual-chamber pacemaker   Stroke [facility])    2011 with residual deficit left sided weakness   Tobacco dependence      Medicines No orders of the defined types were placed in this encounter.   I have reviewed the patients home medicines and have made adjustments as needed  Problem List / ED Course: Problem List Items Addressed This Visit   None Visit Diagnoses     Dead on arrival    -  Primary   Gastrointestinal hemorrhage, unspecified gastrointestinal hemorrhage type                       This note was created using dictation software, which may contain spelling or grammatical errors.    Loetta Rough, MD May 08, 2023 1235

## 2023-04-26 DEATH — deceased

## 2023-06-26 ENCOUNTER — Ambulatory Visit: Payer: Medicare HMO | Admitting: Diagnostic Neuroimaging

## 2023-07-25 ENCOUNTER — Ambulatory Visit: Payer: Medicare HMO | Admitting: Podiatry
